# Patient Record
Sex: Female | Born: 1940
Health system: Southern US, Community
[De-identification: ages and names within clinical notes are randomized; demographics above are authoritative.]

## PROBLEM LIST (undated history)

## (undated) DIAGNOSIS — K760 Fatty (change of) liver, not elsewhere classified: Secondary | ICD-10-CM

## (undated) DIAGNOSIS — D759 Disease of blood and blood-forming organs, unspecified: Secondary | ICD-10-CM

## (undated) DIAGNOSIS — C539 Malignant neoplasm of cervix uteri, unspecified: Secondary | ICD-10-CM

## (undated) DIAGNOSIS — F40240 Claustrophobia: Secondary | ICD-10-CM

## (undated) DIAGNOSIS — G2581 Restless legs syndrome: Secondary | ICD-10-CM

## (undated) DIAGNOSIS — M720 Palmar fascial fibromatosis [Dupuytren]: Secondary | ICD-10-CM

## (undated) DIAGNOSIS — G3184 Mild cognitive impairment, so stated: Secondary | ICD-10-CM

## (undated) DIAGNOSIS — I10 Essential (primary) hypertension: Secondary | ICD-10-CM

## (undated) DIAGNOSIS — D649 Anemia, unspecified: Secondary | ICD-10-CM

## (undated) DIAGNOSIS — I499 Cardiac arrhythmia, unspecified: Secondary | ICD-10-CM

## (undated) DIAGNOSIS — Z87442 Personal history of urinary calculi: Secondary | ICD-10-CM

## (undated) DIAGNOSIS — Z862 Personal history of diseases of the blood and blood-forming organs and certain disorders involving the immune mechanism: Secondary | ICD-10-CM

## (undated) DIAGNOSIS — I491 Atrial premature depolarization: Secondary | ICD-10-CM

## (undated) HISTORY — DX: Malignant neoplasm of cervix uteri, unspecified: C53.9

## (undated) HISTORY — PX: CERVICAL LAMINECTOMY: SHX94

## (undated) HISTORY — PX: OTHER SURGICAL HISTORY: SHX169

## (undated) HISTORY — DX: Essential (primary) hypertension: I10

## (undated) HISTORY — DX: Atrial premature depolarization: I49.1

## (undated) HISTORY — PX: HARVEST BONE GRAFT: SHX377

## (undated) HISTORY — PX: APPENDECTOMY: SHX54

## (undated) HISTORY — DX: Personal history of diseases of the blood and blood-forming organs and certain disorders involving the immune mechanism: Z86.2

---

## 1998-06-15 ENCOUNTER — Ambulatory Visit (HOSPITAL_COMMUNITY): Admission: RE | Admit: 1998-06-15 | Discharge: 1998-06-15 | Payer: Self-pay

## 1998-12-17 ENCOUNTER — Ambulatory Visit (HOSPITAL_COMMUNITY): Admission: RE | Admit: 1998-12-17 | Discharge: 1998-12-17 | Payer: Self-pay | Admitting: Interventional Cardiology

## 1998-12-17 ENCOUNTER — Encounter: Payer: Self-pay | Admitting: Interventional Cardiology

## 2000-09-18 ENCOUNTER — Ambulatory Visit (HOSPITAL_COMMUNITY): Admission: RE | Admit: 2000-09-18 | Discharge: 2000-09-18 | Payer: Self-pay | Admitting: Emergency Medicine

## 2000-09-18 ENCOUNTER — Encounter: Payer: Self-pay | Admitting: Emergency Medicine

## 2002-05-05 ENCOUNTER — Ambulatory Visit (HOSPITAL_COMMUNITY): Admission: RE | Admit: 2002-05-05 | Discharge: 2002-05-05 | Payer: Self-pay | Admitting: Emergency Medicine

## 2002-05-05 ENCOUNTER — Encounter: Payer: Self-pay | Admitting: Emergency Medicine

## 2003-04-20 ENCOUNTER — Ambulatory Visit (HOSPITAL_COMMUNITY): Admission: RE | Admit: 2003-04-20 | Discharge: 2003-04-20 | Payer: Self-pay | Admitting: Emergency Medicine

## 2003-04-20 ENCOUNTER — Encounter: Payer: Self-pay | Admitting: Emergency Medicine

## 2004-09-06 ENCOUNTER — Ambulatory Visit (HOSPITAL_COMMUNITY): Admission: RE | Admit: 2004-09-06 | Discharge: 2004-09-06 | Payer: Self-pay | Admitting: Emergency Medicine

## 2005-03-09 ENCOUNTER — Emergency Department (HOSPITAL_COMMUNITY): Admission: EM | Admit: 2005-03-09 | Discharge: 2005-03-09 | Payer: Self-pay | Admitting: Emergency Medicine

## 2005-11-21 ENCOUNTER — Ambulatory Visit (HOSPITAL_COMMUNITY): Admission: RE | Admit: 2005-11-21 | Discharge: 2005-11-21 | Payer: Self-pay | Admitting: Internal Medicine

## 2005-12-27 ENCOUNTER — Encounter: Admission: RE | Admit: 2005-12-27 | Discharge: 2005-12-27 | Payer: Self-pay | Admitting: Internal Medicine

## 2006-11-23 ENCOUNTER — Ambulatory Visit (HOSPITAL_COMMUNITY): Admission: RE | Admit: 2006-11-23 | Discharge: 2006-11-23 | Payer: Self-pay | Admitting: Internal Medicine

## 2006-11-29 ENCOUNTER — Encounter: Admission: RE | Admit: 2006-11-29 | Discharge: 2006-11-29 | Payer: Self-pay | Admitting: Internal Medicine

## 2007-11-26 ENCOUNTER — Ambulatory Visit (HOSPITAL_COMMUNITY): Admission: RE | Admit: 2007-11-26 | Discharge: 2007-11-26 | Payer: Self-pay | Admitting: Internal Medicine

## 2008-11-26 ENCOUNTER — Ambulatory Visit (HOSPITAL_COMMUNITY): Admission: RE | Admit: 2008-11-26 | Discharge: 2008-11-26 | Payer: Self-pay | Admitting: Internal Medicine

## 2009-04-26 ENCOUNTER — Encounter: Admission: RE | Admit: 2009-04-26 | Discharge: 2009-04-26 | Payer: Self-pay | Admitting: Cardiology

## 2009-06-20 ENCOUNTER — Emergency Department (HOSPITAL_COMMUNITY): Admission: EM | Admit: 2009-06-20 | Discharge: 2009-06-20 | Payer: Self-pay | Admitting: Emergency Medicine

## 2009-11-29 ENCOUNTER — Ambulatory Visit (HOSPITAL_COMMUNITY): Admission: RE | Admit: 2009-11-29 | Discharge: 2009-11-29 | Payer: Self-pay | Admitting: Internal Medicine

## 2010-11-30 ENCOUNTER — Ambulatory Visit (HOSPITAL_COMMUNITY)
Admission: RE | Admit: 2010-11-30 | Discharge: 2010-11-30 | Payer: Self-pay | Source: Home / Self Care | Attending: Internal Medicine | Admitting: Internal Medicine

## 2010-12-06 ENCOUNTER — Encounter
Admission: RE | Admit: 2010-12-06 | Discharge: 2010-12-06 | Payer: Self-pay | Source: Home / Self Care | Attending: Internal Medicine | Admitting: Internal Medicine

## 2011-03-19 LAB — URINALYSIS, ROUTINE W REFLEX MICROSCOPIC
Bilirubin Urine: NEGATIVE
Hgb urine dipstick: NEGATIVE
Nitrite: NEGATIVE
Specific Gravity, Urine: 1.026 (ref 1.005–1.030)
pH: 6.5 (ref 5.0–8.0)

## 2011-03-19 LAB — COMPREHENSIVE METABOLIC PANEL
AST: 30 U/L (ref 0–37)
BUN: 15 mg/dL (ref 6–23)
Chloride: 104 mEq/L (ref 96–112)
Creatinine, Ser: 0.75 mg/dL (ref 0.4–1.2)
Glucose, Bld: 174 mg/dL — ABNORMAL HIGH (ref 70–99)
Potassium: 3.6 mEq/L (ref 3.5–5.1)
Sodium: 136 mEq/L (ref 135–145)
Total Protein: 7.1 g/dL (ref 6.0–8.3)

## 2011-03-19 LAB — POCT CARDIAC MARKERS: CKMB, poc: <1 ng/mL — ABNORMAL LOW (ref 1.0–8.0)

## 2011-03-19 LAB — CBC
RBC: 4.41 MIL/uL (ref 3.87–5.11)
WBC: 7.3 10*3/uL (ref 4.0–10.5)

## 2011-03-19 LAB — DIFFERENTIAL
Eosinophils Absolute: 0 10*3/uL (ref 0.0–0.7)
Eosinophils Relative: 0 % (ref 0–5)
Lymphocytes Relative: 9 % — ABNORMAL LOW (ref 12–46)
Lymphs Abs: 0.7 10*3/uL (ref 0.7–4.0)
Monocytes Absolute: 0.2 10*3/uL (ref 0.1–1.0)
Neutrophils Relative %: 88 % — ABNORMAL HIGH (ref 43–77)

## 2011-10-31 ENCOUNTER — Other Ambulatory Visit (HOSPITAL_COMMUNITY): Payer: Self-pay | Admitting: Internal Medicine

## 2011-10-31 DIAGNOSIS — Z1231 Encounter for screening mammogram for malignant neoplasm of breast: Secondary | ICD-10-CM

## 2011-12-08 ENCOUNTER — Ambulatory Visit (HOSPITAL_COMMUNITY)
Admission: RE | Admit: 2011-12-08 | Discharge: 2011-12-08 | Disposition: A | Discharge status: Home / Self Care | Payer: PRIVATE HEALTH INSURANCE | Source: Ambulatory Visit | Attending: Internal Medicine | Admitting: Internal Medicine

## 2011-12-08 DIAGNOSIS — Z1231 Encounter for screening mammogram for malignant neoplasm of breast: Secondary | ICD-10-CM | POA: Insufficient documentation

## 2012-11-06 ENCOUNTER — Other Ambulatory Visit (HOSPITAL_COMMUNITY): Payer: Self-pay | Admitting: Internal Medicine

## 2012-11-06 DIAGNOSIS — Z1231 Encounter for screening mammogram for malignant neoplasm of breast: Secondary | ICD-10-CM

## 2012-12-09 ENCOUNTER — Ambulatory Visit (HOSPITAL_COMMUNITY)
Admission: RE | Admit: 2012-12-09 | Discharge: 2012-12-09 | Disposition: A | Discharge status: Home / Self Care | Payer: PRIVATE HEALTH INSURANCE | Source: Ambulatory Visit | Attending: Internal Medicine | Admitting: Internal Medicine

## 2012-12-09 DIAGNOSIS — Z1231 Encounter for screening mammogram for malignant neoplasm of breast: Secondary | ICD-10-CM | POA: Insufficient documentation

## 2013-12-02 ENCOUNTER — Other Ambulatory Visit (HOSPITAL_COMMUNITY): Payer: Self-pay | Admitting: Internal Medicine

## 2013-12-02 DIAGNOSIS — Z1231 Encounter for screening mammogram for malignant neoplasm of breast: Secondary | ICD-10-CM

## 2013-12-17 ENCOUNTER — Ambulatory Visit (HOSPITAL_COMMUNITY)
Admission: RE | Admit: 2013-12-17 | Discharge: 2013-12-17 | Disposition: A | Discharge status: Home / Self Care | Payer: PRIVATE HEALTH INSURANCE | Source: Ambulatory Visit | Attending: Internal Medicine | Admitting: Internal Medicine

## 2013-12-17 DIAGNOSIS — Z1231 Encounter for screening mammogram for malignant neoplasm of breast: Secondary | ICD-10-CM | POA: Insufficient documentation

## 2014-01-19 ENCOUNTER — Other Ambulatory Visit: Payer: Self-pay | Admitting: Internal Medicine

## 2014-01-19 DIAGNOSIS — M542 Cervicalgia: Secondary | ICD-10-CM

## 2014-01-20 ENCOUNTER — Ambulatory Visit
Admission: RE | Admit: 2014-01-20 | Discharge: 2014-01-20 | Disposition: A | Discharge status: Home / Self Care | Payer: Medicare Other | Source: Ambulatory Visit | Attending: Internal Medicine | Admitting: Internal Medicine

## 2014-01-20 DIAGNOSIS — M542 Cervicalgia: Secondary | ICD-10-CM

## 2014-01-20 MED ORDER — GADOBENATE DIMEGLUMINE 529 MG/ML IV SOLN
12.0000 mL | Freq: Once | INTRAVENOUS | Status: AC | PRN
  Administered 2014-01-20: 12 mL via INTRAVENOUS

## 2016-01-27 DIAGNOSIS — I10 Essential (primary) hypertension: Secondary | ICD-10-CM | POA: Diagnosis not present

## 2016-02-08 DIAGNOSIS — I10 Essential (primary) hypertension: Secondary | ICD-10-CM | POA: Diagnosis not present

## 2016-02-08 DIAGNOSIS — F5104 Psychophysiologic insomnia: Secondary | ICD-10-CM | POA: Diagnosis not present

## 2016-02-08 DIAGNOSIS — F329 Major depressive disorder, single episode, unspecified: Secondary | ICD-10-CM | POA: Diagnosis not present

## 2016-02-08 DIAGNOSIS — Z6822 Body mass index (BMI) 22.0-22.9, adult: Secondary | ICD-10-CM | POA: Diagnosis not present

## 2016-02-08 DIAGNOSIS — Z1389 Encounter for screening for other disorder: Secondary | ICD-10-CM | POA: Diagnosis not present

## 2016-02-08 DIAGNOSIS — R0789 Other chest pain: Secondary | ICD-10-CM | POA: Diagnosis not present

## 2016-02-08 DIAGNOSIS — G2581 Restless legs syndrome: Secondary | ICD-10-CM | POA: Diagnosis not present

## 2016-02-08 DIAGNOSIS — M25552 Pain in left hip: Secondary | ICD-10-CM | POA: Diagnosis not present

## 2016-02-08 DIAGNOSIS — Z0000 Encounter for general adult medical examination without abnormal findings: Secondary | ICD-10-CM | POA: Diagnosis not present

## 2016-02-17 DIAGNOSIS — Z1212 Encounter for screening for malignant neoplasm of rectum: Secondary | ICD-10-CM | POA: Diagnosis not present

## 2016-04-08 ENCOUNTER — Observation Stay (HOSPITAL_COMMUNITY)
Admission: EM | Admit: 2016-04-08 | Discharge: 2016-04-10 | Disposition: A | Discharge status: Home / Self Care | Payer: PPO | Attending: Internal Medicine | Admitting: Internal Medicine

## 2016-04-08 ENCOUNTER — Encounter (HOSPITAL_COMMUNITY): Payer: Self-pay | Admitting: Emergency Medicine

## 2016-04-08 DIAGNOSIS — M549 Dorsalgia, unspecified: Secondary | ICD-10-CM | POA: Diagnosis not present

## 2016-04-08 DIAGNOSIS — M542 Cervicalgia: Secondary | ICD-10-CM | POA: Diagnosis not present

## 2016-04-08 DIAGNOSIS — S12601A Unspecified nondisplaced fracture of seventh cervical vertebra, initial encounter for closed fracture: Secondary | ICD-10-CM | POA: Insufficient documentation

## 2016-04-08 DIAGNOSIS — T148 Other injury of unspecified body region: Secondary | ICD-10-CM | POA: Diagnosis not present

## 2016-04-08 DIAGNOSIS — S0990XA Unspecified injury of head, initial encounter: Secondary | ICD-10-CM

## 2016-04-08 DIAGNOSIS — S12400A Unspecified displaced fracture of fifth cervical vertebra, initial encounter for closed fracture: Secondary | ICD-10-CM | POA: Diagnosis not present

## 2016-04-08 DIAGNOSIS — Z8249 Family history of ischemic heart disease and other diseases of the circulatory system: Secondary | ICD-10-CM | POA: Diagnosis not present

## 2016-04-08 DIAGNOSIS — S129XXA Fracture of neck, unspecified, initial encounter: Secondary | ICD-10-CM | POA: Diagnosis present

## 2016-04-08 DIAGNOSIS — I4891 Unspecified atrial fibrillation: Secondary | ICD-10-CM | POA: Insufficient documentation

## 2016-04-08 DIAGNOSIS — R55 Syncope and collapse: Secondary | ICD-10-CM | POA: Diagnosis not present

## 2016-04-08 DIAGNOSIS — R51 Headache: Secondary | ICD-10-CM | POA: Diagnosis not present

## 2016-04-08 DIAGNOSIS — S12501A Unspecified nondisplaced fracture of sixth cervical vertebra, initial encounter for closed fracture: Secondary | ICD-10-CM | POA: Insufficient documentation

## 2016-04-08 DIAGNOSIS — E876 Hypokalemia: Secondary | ICD-10-CM | POA: Diagnosis not present

## 2016-04-08 DIAGNOSIS — G2581 Restless legs syndrome: Secondary | ICD-10-CM | POA: Insufficient documentation

## 2016-04-08 DIAGNOSIS — W19XXXA Unspecified fall, initial encounter: Secondary | ICD-10-CM | POA: Insufficient documentation

## 2016-04-08 HISTORY — DX: Restless legs syndrome: G25.81

## 2016-04-08 LAB — CBC
HEMATOCRIT: 34.1 % — AB (ref 36.0–46.0)
Hemoglobin: 11.5 g/dL — ABNORMAL LOW (ref 12.0–15.0)
MCH: 30.7 pg (ref 26.0–34.0)
MCHC: 33.7 g/dL (ref 30.0–36.0)
MCV: 90.9 fL (ref 78.0–100.0)
Platelets: 238 10*3/uL (ref 150–400)
RBC: 3.75 MIL/uL — ABNORMAL LOW (ref 3.87–5.11)
RDW: 13 % (ref 11.5–15.5)
WBC: 7.8 10*3/uL (ref 4.0–10.5)

## 2016-04-08 LAB — COMPREHENSIVE METABOLIC PANEL
ALT: 18 U/L (ref 14–54)
ANION GAP: 11 (ref 5–15)
AST: 31 U/L (ref 15–41)
Albumin: 3.7 g/dL (ref 3.5–5.0)
Alkaline Phosphatase: 65 U/L (ref 38–126)
BUN: 21 mg/dL — AB (ref 6–20)
CHLORIDE: 106 mmol/L (ref 101–111)
CO2: 24 mmol/L (ref 22–32)
Calcium: 9.2 mg/dL (ref 8.9–10.3)
Creatinine, Ser: 0.84 mg/dL (ref 0.44–1.00)
Glucose, Bld: 142 mg/dL — ABNORMAL HIGH (ref 65–99)
POTASSIUM: 3.3 mmol/L — AB (ref 3.5–5.1)
Sodium: 141 mmol/L (ref 135–145)
Total Bilirubin: 0.6 mg/dL (ref 0.3–1.2)
Total Protein: 6.5 g/dL (ref 6.5–8.1)

## 2016-04-08 LAB — DIFFERENTIAL
BASOS ABS: 0 10*3/uL (ref 0.0–0.1)
BASOS PCT: 1 %
EOS ABS: 0.1 10*3/uL (ref 0.0–0.7)
EOS PCT: 2 %
Lymphocytes Relative: 17 %
Lymphs Abs: 1.3 10*3/uL (ref 0.7–4.0)
MONOS PCT: 7 %
Monocytes Absolute: 0.5 10*3/uL (ref 0.1–1.0)
Neutro Abs: 5.8 10*3/uL (ref 1.7–7.7)
Neutrophils Relative %: 73 %

## 2016-04-08 LAB — I-STAT CHEM 8, ED
BUN: 26 mg/dL — ABNORMAL HIGH (ref 6–20)
CREATININE: 0.8 mg/dL (ref 0.44–1.00)
Calcium, Ion: 1.15 mmol/L (ref 1.13–1.30)
Chloride: 103 mmol/L (ref 101–111)
Glucose, Bld: 141 mg/dL — ABNORMAL HIGH (ref 65–99)
HEMATOCRIT: 36 % (ref 36.0–46.0)
HEMOGLOBIN: 12.2 g/dL (ref 12.0–15.0)
Potassium: 3.4 mmol/L — ABNORMAL LOW (ref 3.5–5.1)
SODIUM: 142 mmol/L (ref 135–145)
TCO2: 25 mmol/L (ref 0–100)

## 2016-04-08 LAB — PROTIME-INR
INR: 1.08 (ref 0.00–1.49)
PROTHROMBIN TIME: 14.2 s (ref 11.6–15.2)

## 2016-04-08 LAB — ETHANOL: Alcohol, Ethyl (B): <5 mg/dL (ref <5)

## 2016-04-08 LAB — I-STAT TROPONIN, ED: TROPONIN I, POC: 0 ng/mL (ref 0.00–0.08)

## 2016-04-08 LAB — MAGNESIUM: MAGNESIUM: 1.8 mg/dL (ref 1.7–2.4)

## 2016-04-08 MED ORDER — SODIUM CHLORIDE 0.9 % IV BOLUS (SEPSIS)
1000.0000 mL | Freq: Once | INTRAVENOUS | Status: AC
  Administered 2016-04-08: 1000 mL via INTRAVENOUS

## 2016-04-08 NOTE — ED Provider Notes (Signed)
CSN: [id number]     Arrival date & time 04/08/16  2225 History   First MD Initiated Contact with Patient 04/08/16 2228     Chief Complaint  Patient presents with  . Fall     (Consider location/radiation/quality/duration/timing/severity/associated sxs/prior Treatment) HPI Comments: 75 year old female with atrial fibrillation history unknown smoking history presents with neck and upper back pain after fall. Patient had unwitnessed fall likely syncope at the home around 9:30 husband found her less responsive. Patient has improved since however has mild memory issues since. No details of event known patient doesn't recall details. Patient has had decreased intake recently and per family has been focused on losing weight. No stroke or cardiac history. No focal lateral deficits.  Patient is a 75 y.o. female presenting with fall. The history is provided by the patient.  Fall Pertinent negatives include no chest pain, no abdominal pain, no headaches and no shortness of breath.    Past Medical History  Diagnosis Date  . Restless leg   . A-fib [facility])    Past Surgical History  Procedure Laterality Date  . Harvest bone graft     No family history on file. Social History  Substance Use Topics  . Smoking status: Unknown If Ever Smoked  . Smokeless tobacco: None  . Alcohol Use: No   OB History    No data available     Review of Systems  Constitutional: Positive for fatigue. Negative for fever and chills.  HENT: Negative for congestion.   Eyes: Negative for visual disturbance.  Respiratory: Negative for shortness of breath.   Cardiovascular: Negative for chest pain.  Gastrointestinal: Negative for vomiting and abdominal pain.  Genitourinary: Negative for dysuria and flank pain.  Musculoskeletal: Positive for arthralgias and neck pain. Negative for back pain and neck stiffness.  Skin: Negative for rash.  Neurological: Positive for syncope. Negative for light-headedness and headaches.       Allergies  Review of patient's allergies indicates no known allergies.  Home Medications   Prior to Admission medications   Not on File   BP 138/65 mmHg  Pulse 82  Temp(Src) 97.7 F (36.5 C) (Oral)  Resp 22  SpO2 100% Physical Exam  Constitutional: She is oriented to person, place, and time. She appears well-developed and well-nourished.  HENT:  Head: Normocephalic.  Dry mucous membranes  Eyes: Conjunctivae are normal. Right eye exhibits no discharge. Left eye exhibits no discharge.  Neck: Normal range of motion. Neck supple. No tracheal deviation present.  Cardiovascular: Normal rate and regular rhythm.   Pulmonary/Chest: Effort normal and breath sounds normal.  Abdominal: Soft. She exhibits no distension. There is no tenderness. There is no guarding.  Musculoskeletal: She exhibits tenderness. She exhibits no edema.  No midline thoracic or lumbar tenderness mild midline cervical tenderness c-collar in place. No focal tenderness to hips knees or ankles bilateral, no shoulder or elbow tenderness bilateral. Patient has 5+ strength flexion and extension of the shoulders and wrists bilateral. Patient has 5+ strength with hip flexion, great toe flexion and extension bilateral  Neurological: She is alert and oriented to person, place, and time.  Reflex Scores:      Patellar reflexes are 2+ on the right side and 2+ on the left side.      Achilles reflexes are 2+ on the right side and 2+ on the left side. Patient general fatigue appearance mild slowness to respond to questions. Patient alert to city name however did not recall year. Mild repetitive questioning. Extraocular  muscle function intact no facial droop, no arm drift or leg drift. Gen. weakness on exam pupils equal bilateral  Skin: Skin is warm. No rash noted.  Psychiatric: She is slowed.  Repetitive questioning  Nursing note and vitals reviewed.   ED Course  Procedures (including critical care time) Labs  Review Labs Reviewed  CBC - Abnormal; Notable for the following:    RBC 3.75 (*)    Hemoglobin 11.5 (*)    HCT 34.1 (*)    All other components within normal limits  COMPREHENSIVE METABOLIC PANEL - Abnormal; Notable for the following:    Potassium 3.3 (*)    Glucose, Bld 142 (*)    BUN 21 (*)    All other components within normal limits  URINALYSIS, ROUTINE W REFLEX MICROSCOPIC (NOT AT [facility]) - Abnormal; Notable for the following:    Glucose, UA 100 (*)    All other components within normal limits  I-STAT CHEM 8, ED - Abnormal; Notable for the following:    Potassium 3.4 (*)    BUN 26 (*)    Glucose, Bld 141 (*)    All other components within normal limits  ETHANOL  PROTIME-INR  DIFFERENTIAL  MAGNESIUM  URINE RAPID DRUG SCREEN, HOSP PERFORMED  I-STAT TROPOININ, ED    Imaging Review Dg Chest 2 View  04/09/2016  CLINICAL DATA:  Unwitnessed fall tonight. Confusion. Upper back and LEFT shoulder pain. EXAM: CHEST  2 VIEW COMPARISON:  Chest radiograph Apr 26, 2009 FINDINGS: Cardiomediastinal silhouette is normal. Mildly calcified aortic knob. The lungs are clear without pleural effusions or focal consolidations. Trachea projects midline and there is no pneumothorax. Soft tissue planes and included osseous structures are non-suspicious. Catheter projects in retroperitoneum, possibly external to the patient seen only on the lateral radiograph. IMPRESSION: No active cardiopulmonary disease. Electronically Signed   By: Elon Alas M.D.   On: 04/09/2016 00:41   Ct Head Wo Contrast  04/09/2016  CLINICAL DATA:  [place], patient does not recall event. Headache and vomiting, neck pain. EXAM: CT HEAD WITHOUT CONTRAST CT CERVICAL SPINE WITHOUT CONTRAST TECHNIQUE: Multidetector CT imaging of the head and cervical spine was performed following the standard protocol without intravenous contrast. Multiplanar CT image reconstructions of the cervical spine were also generated. COMPARISON:  MRI of the  cervical spine January 20, 2014 and CT HEAD June 20, 2009. FINDINGS: CT HEAD FINDINGS INTRACRANIAL CONTENTS: The ventricles and sulci are normal for age. No intraparenchymal hemorrhage, mass effect nor midline shift. Patchy supratentorial white matter hypodensities are less than expected for patient's age and though non-specific likely represent chronic small vessel ischemic disease. No acute large vascular territory infarcts. No abnormal extra-axial fluid collections. Basal cisterns are patent. Moderate calcific atherosclerosis of the carotid siphons. ORBITS: The included ocular globes and orbital contents are non-suspicious. SINUSES: Small RIGHT ethmoid mucosal retention cyst. The mastoid aircells and included paranasal sinuses are otherwise well-aerated. SKULL/SOFT TISSUES: No skull fracture. No significant soft tissue swelling. CT CERVICAL SPINE FINDINGS Osseous structures: Acute nondisplaced fracture through C5 bilateral pedicles extending into the facets. New nondisplaced fracture extension through C6-7 vertebra anterior and middle columns, status post C5 through C7 fusion with arthrodesis. The remaining vertebral bodies are intact. Straightened cervical lordosis. No malalignment. Severe at C3-4 and C4-5 disc height loss with uncovertebral hypertrophy compatible with degenerative disc. Multilevel moderate facet arthropathy. No destructive bony lesions. C1-2 articulation maintained with mild arthropathy. No destructive bony lesions. No significant prevertebral or paraspinal soft tissue swelling. Bilateral lung apical fibronodular  scarring. IMPRESSION: CT HEAD: Negative CT HEAD for age. CT CERVICAL SPINE: Acute nondisplaced 3 column fracture through C5 through C7 solid fusion mass without malalignment. As fractures involve the pedicles, vertebral artery injury is possible and could be evaluated by CTA NECK. Acute findings discussed with and reconfirmed by Dr.Evgenia Merriman on 04/09/2016 at 1:16 am. Electronically  Signed   By: Elon Alas M.D.   On: 04/09/2016 01:16   Ct Cervical Spine Wo Contrast  04/09/2016  CLINICAL DATA:  [place], patient does not recall event. Headache and vomiting, neck pain. EXAM: CT HEAD WITHOUT CONTRAST CT CERVICAL SPINE WITHOUT CONTRAST TECHNIQUE: Multidetector CT imaging of the head and cervical spine was performed following the standard protocol without intravenous contrast. Multiplanar CT image reconstructions of the cervical spine were also generated. COMPARISON:  MRI of the cervical spine January 20, 2014 and CT HEAD June 20, 2009. FINDINGS: CT HEAD FINDINGS INTRACRANIAL CONTENTS: The ventricles and sulci are normal for age. No intraparenchymal hemorrhage, mass effect nor midline shift. Patchy supratentorial white matter hypodensities are less than expected for patient's age and though non-specific likely represent chronic small vessel ischemic disease. No acute large vascular territory infarcts. No abnormal extra-axial fluid collections. Basal cisterns are patent. Moderate calcific atherosclerosis of the carotid siphons. ORBITS: The included ocular globes and orbital contents are non-suspicious. SINUSES: Small RIGHT ethmoid mucosal retention cyst. The mastoid aircells and included paranasal sinuses are otherwise well-aerated. SKULL/SOFT TISSUES: No skull fracture. No significant soft tissue swelling. CT CERVICAL SPINE FINDINGS Osseous structures: Acute nondisplaced fracture through C5 bilateral pedicles extending into the facets. New nondisplaced fracture extension through C6-7 vertebra anterior and middle columns, status post C5 through C7 fusion with arthrodesis. The remaining vertebral bodies are intact. Straightened cervical lordosis. No malalignment. Severe at C3-4 and C4-5 disc height loss with uncovertebral hypertrophy compatible with degenerative disc. Multilevel moderate facet arthropathy. No destructive bony lesions. C1-2 articulation maintained with mild arthropathy. No  destructive bony lesions. No significant prevertebral or paraspinal soft tissue swelling. Bilateral lung apical fibronodular scarring. IMPRESSION: CT HEAD: Negative CT HEAD for age. CT CERVICAL SPINE: Acute nondisplaced 3 column fracture through C5 through C7 solid fusion mass without malalignment. As fractures involve the pedicles, vertebral artery injury is possible and could be evaluated by CTA NECK. Acute findings discussed with and reconfirmed by Dr.Reyce Lubeck on 04/09/2016 at 1:16 am. Electronically Signed   By: Elon Alas M.D.   On: 04/09/2016 01:16   I have personally reviewed and evaluated these images and lab results as part of my medical decision-making.   EKG Interpretation   Date/Time:  Saturday April 08 2016 23:10:21 EDT Ventricular Rate:  77 PR Interval:  184 QRS Duration: 89 QT Interval:  391 QTC Calculation: 442 R Axis:   58 Text Interpretation:  Sinus rhythm RSR' in V1 or V2, probably normal  variant Confirmed by Keaundre Thelin  MD, Onya Eutsler ([id number]) on 04/08/2016 11:17:26 PM      MDM   Final diagnoses:  Syncope and collapse  Acute head injury, initial encounter  Cervical spine fracture, initial encounter   Patient presents after unwitnessed fall/syncope. No focal neuro deficits except for mild memory. Clinical concern for syncope with concussion. Plan for general workup, CT had neck, x-ray and likely plan for telemetry observation. Discussed with neurology who agrees with plan.  Radiologist discussed CT results concerning for C5 C6 C7 oblique fractures. Reassessment patient has pain in the neck and nausea however denies any numbness or weakness in her arms or legs.  Discussed the case with Dr. Cyndy Freeze will look at the images and assessed the patient. Paged medicine for admission.  The patients results and plan were reviewed and discussed.   Any x-rays performed were independently reviewed by myself.   Differential diagnosis were considered with the presenting  HPI.  Medications  sodium chloride 0.9 % bolus 1,000 mL (1,000 mLs Intravenous New Bag/Given 04/08/16 2320)  ondansetron (ZOFRAN) injection 4 mg (4 mg Intravenous Given 04/09/16 0051)    Filed Vitals:   04/08/16 2315 04/08/16 2330 04/08/16 2345 04/09/16 0000  BP: 127/61 131/59 129/59 138/65  Pulse: 75 74 78 82  Temp:      TempSrc:      Resp: 18 14 23 22   SpO2: 100% 100% 100% 100%    Final diagnoses:  Syncope and collapse  Acute head injury, initial encounter  Cervical spine fracture, initial encounter    Admission/ observation were discussed with the admitting physician, patient and/or family and they are comfortable with the plan.    Elnora Morrison, MD 04/09/16 [phone]

## 2016-04-08 NOTE — ED Notes (Addendum)
Pt arrives via EMS for unwitnessed fall, EMS reports intially unresponsive, now conscious but disoriented and repeatative questioning, asking for husband. CCollar in place. C/o upper back pain and bilateral shoulder pain.

## 2016-04-09 ENCOUNTER — Emergency Department (HOSPITAL_COMMUNITY): Payer: PPO

## 2016-04-09 ENCOUNTER — Inpatient Hospital Stay (HOSPITAL_COMMUNITY): Payer: PPO

## 2016-04-09 ENCOUNTER — Encounter (HOSPITAL_COMMUNITY): Payer: Self-pay | Admitting: Cardiology

## 2016-04-09 DIAGNOSIS — R51 Headache: Secondary | ICD-10-CM | POA: Diagnosis not present

## 2016-04-09 DIAGNOSIS — M549 Dorsalgia, unspecified: Secondary | ICD-10-CM | POA: Diagnosis not present

## 2016-04-09 DIAGNOSIS — R55 Syncope and collapse: Secondary | ICD-10-CM | POA: Diagnosis present

## 2016-04-09 DIAGNOSIS — S0990XA Unspecified injury of head, initial encounter: Secondary | ICD-10-CM | POA: Diagnosis present

## 2016-04-09 DIAGNOSIS — S129XXA Fracture of neck, unspecified, initial encounter: Secondary | ICD-10-CM

## 2016-04-09 DIAGNOSIS — I480 Paroxysmal atrial fibrillation: Secondary | ICD-10-CM

## 2016-04-09 DIAGNOSIS — S199XXA Unspecified injury of neck, initial encounter: Secondary | ICD-10-CM | POA: Diagnosis not present

## 2016-04-09 DIAGNOSIS — S12491A Other nondisplaced fracture of fifth cervical vertebra, initial encounter for closed fracture: Secondary | ICD-10-CM | POA: Diagnosis not present

## 2016-04-09 DIAGNOSIS — I4891 Unspecified atrial fibrillation: Secondary | ICD-10-CM | POA: Diagnosis not present

## 2016-04-09 DIAGNOSIS — S12591A Other nondisplaced fracture of sixth cervical vertebra, initial encounter for closed fracture: Secondary | ICD-10-CM | POA: Diagnosis not present

## 2016-04-09 DIAGNOSIS — S12400A Unspecified displaced fracture of fifth cervical vertebra, initial encounter for closed fracture: Secondary | ICD-10-CM | POA: Diagnosis not present

## 2016-04-09 DIAGNOSIS — S12691A Other nondisplaced fracture of seventh cervical vertebra, initial encounter for closed fracture: Secondary | ICD-10-CM | POA: Diagnosis not present

## 2016-04-09 DIAGNOSIS — M542 Cervicalgia: Secondary | ICD-10-CM | POA: Diagnosis not present

## 2016-04-09 HISTORY — DX: Syncope and collapse: R55

## 2016-04-09 HISTORY — DX: Unspecified injury of head, initial encounter: S09.90XA

## 2016-04-09 HISTORY — DX: Fracture of neck, unspecified, initial encounter: S12.9XXA

## 2016-04-09 HISTORY — DX: Unspecified atrial fibrillation: I48.91

## 2016-04-09 LAB — URINALYSIS, ROUTINE W REFLEX MICROSCOPIC
Bilirubin Urine: NEGATIVE
GLUCOSE, UA: 100 mg/dL — AB
Hgb urine dipstick: NEGATIVE
KETONES UR: NEGATIVE mg/dL
LEUKOCYTES UA: NEGATIVE
NITRITE: NEGATIVE
PROTEIN: NEGATIVE mg/dL
Specific Gravity, Urine: 1.026 (ref 1.005–1.030)
pH: 5 (ref 5.0–8.0)

## 2016-04-09 LAB — BASIC METABOLIC PANEL
Anion gap: 7 (ref 5–15)
BUN: 16 mg/dL (ref 6–20)
CALCIUM: 9 mg/dL (ref 8.9–10.3)
CHLORIDE: 109 mmol/L (ref 101–111)
CO2: 24 mmol/L (ref 22–32)
CREATININE: 0.71 mg/dL (ref 0.44–1.00)
Glucose, Bld: 124 mg/dL — ABNORMAL HIGH (ref 65–99)
Potassium: 3.8 mmol/L (ref 3.5–5.1)
SODIUM: 140 mmol/L (ref 135–145)

## 2016-04-09 LAB — CBC
HCT: 34.5 % — ABNORMAL LOW (ref 36.0–46.0)
Hemoglobin: 11.4 g/dL — ABNORMAL LOW (ref 12.0–15.0)
MCH: 29.9 pg (ref 26.0–34.0)
MCHC: 33 g/dL (ref 30.0–36.0)
MCV: 90.6 fL (ref 78.0–100.0)
PLATELETS: 239 10*3/uL (ref 150–400)
RBC: 3.81 MIL/uL — ABNORMAL LOW (ref 3.87–5.11)
RDW: 13 % (ref 11.5–15.5)
WBC: 7.2 10*3/uL (ref 4.0–10.5)

## 2016-04-09 LAB — RAPID URINE DRUG SCREEN, HOSP PERFORMED
Amphetamines: NOT DETECTED
BARBITURATES: NOT DETECTED
Benzodiazepines: NOT DETECTED
COCAINE: NOT DETECTED
Opiates: NOT DETECTED
Tetrahydrocannabinol: NOT DETECTED

## 2016-04-09 MED ORDER — HYDROCODONE-ACETAMINOPHEN 5-325 MG PO TABS
1.0000 | ORAL_TABLET | ORAL | Status: DC | PRN
  Administered 2016-04-10: 1 via ORAL
  Filled 2016-04-09: qty 1

## 2016-04-09 MED ORDER — ACETAMINOPHEN 650 MG RE SUPP: 650.0000 mg | Freq: Four times a day (QID) | RECTAL | Status: DC | PRN

## 2016-04-09 MED ORDER — ROPINIROLE HCL 1 MG PO TABS
2.0000 mg | ORAL_TABLET | Freq: Every day | ORAL | Status: DC
  Administered 2016-04-09: 2 mg via ORAL
  Filled 2016-04-09: qty 2

## 2016-04-09 MED ORDER — SODIUM CHLORIDE 0.9 % IV SOLN: INTRAVENOUS | Status: AC

## 2016-04-09 MED ORDER — ACETAMINOPHEN 325 MG PO TABS: 650.0000 mg | ORAL_TABLET | Freq: Four times a day (QID) | ORAL | Status: DC | PRN

## 2016-04-09 MED ORDER — IOPAMIDOL (ISOVUE-370) INJECTION 76%
INTRAVENOUS | Status: AC
  Administered 2016-04-09: 50 mL
  Filled 2016-04-09: qty 50

## 2016-04-09 MED ORDER — ROPINIROLE HCL 1 MG PO TABS: 1.0000 mg | ORAL_TABLET | ORAL | Status: DC

## 2016-04-09 MED ORDER — HYDROMORPHONE HCL 1 MG/ML IJ SOLN
0.5000 mg | INTRAMUSCULAR | Status: DC | PRN
  Administered 2016-04-09: 1 mg via INTRAVENOUS
  Filled 2016-04-09: qty 1

## 2016-04-09 MED ORDER — ROPINIROLE HCL 1 MG PO TABS
1.0000 mg | ORAL_TABLET | Freq: Every morning | ORAL | Status: DC
  Administered 2016-04-09 – 2016-04-10 (×2): 1 mg via ORAL
  Filled 2016-04-09 (×2): qty 1

## 2016-04-09 MED ORDER — ONDANSETRON HCL 4 MG PO TABS: 4.0000 mg | ORAL_TABLET | Freq: Four times a day (QID) | ORAL | Status: DC | PRN

## 2016-04-09 MED ORDER — HEPARIN SODIUM (PORCINE) 5000 UNIT/ML IJ SOLN
5000.0000 [IU] | Freq: Three times a day (TID) | INTRAMUSCULAR | Status: DC
  Administered 2016-04-09 – 2016-04-10 (×4): 5000 [IU] via SUBCUTANEOUS
  Filled 2016-04-09 (×4): qty 1

## 2016-04-09 MED ORDER — SODIUM CHLORIDE 0.9% FLUSH
3.0000 mL | Freq: Two times a day (BID) | INTRAVENOUS | Status: DC
  Administered 2016-04-09 – 2016-04-10 (×2): 3 mL via INTRAVENOUS

## 2016-04-09 MED ORDER — ONDANSETRON HCL 4 MG/2ML IJ SOLN
4.0000 mg | Freq: Once | INTRAMUSCULAR | Status: AC
  Administered 2016-04-09: 4 mg via INTRAVENOUS

## 2016-04-09 MED ORDER — ONDANSETRON HCL 4 MG/2ML IJ SOLN
4.0000 mg | Freq: Four times a day (QID) | INTRAMUSCULAR | Status: DC | PRN
  Administered 2016-04-09 (×2): 4 mg via INTRAVENOUS
  Filled 2016-04-09 (×2): qty 2

## 2016-04-09 MED ORDER — SODIUM CHLORIDE 0.9 % IV SOLN
INTRAVENOUS | Status: DC
  Administered 2016-04-09: 05:00:00 via INTRAVENOUS

## 2016-04-09 NOTE — Consult Note (Addendum)
Cardiology Consult Note  Admit date: 04/08/2016 Name: Elizabeth Odom 75 y.o.  female DOB:  1941/02/11 MRN:  [id number]  Today's date:  04/09/2016  Referring Physician:    [facility]  Primary Physician:    Dr. Janie Morning  Reason for Consultation:    Syncope  IMPRESSIONS: 1.  Syncopal episode resulting in neck fracture-differential would be orthostasis versus a bradycardia arrhythmia or tachycardia arrhythmia.  She does have a long-standing history of dizziness that sounds Iikeorthostasis 2.  Anxiety and situational stress 3.  Previous cervical disc disease  RECOMMENDATION:  1.  Would obtain orthostatic blood pressures while she is in the hospital 2.  She needs to have an implantable loop monitor unless a bradycardia arrhythmia is noted while she is on telemetry in the hospital.  This should be done prior to discharge. 3.  Obtain echocardiogram.  HISTORY: This very nice 75 year old female was admitted with a syncopal episode.  She has a history of recurrent dizziness and some recurrent palpitations.  I last saw her in 2010.  At that time she wore an event monitor that showed PACs and some occasional bradycardia in the 50s but no severe bradycardia or severe tachycardia was noted.  No atrial fibrillation was noted.  An echocardiogram and treadmill test were unremarkable at that time.  I have not seen her since then.  Her family reports that she has had recurrent episodes of dizziness more when she is up on her feet described as lightheadedness.  About a year ago she had a syncopal episode when she got up off of the commode but did not seek attention.  She continues to have intermittent dizzy spells and sometimes notes that her heart races during them.  She does not have chest pain or shortness of breath.  She was getting ready for bed and was standing up checking her cell phone according to the family when her husband heard a thump and went in.  She was found slumped over and regained  consciousness after 1 minute but does not have any recollection of the episode.  She was brought here and found to have a cervical fracture and is currently in a neck brace.  She does not have any chest pain or shortness of breath.  She normally is active and swims without difficulty.  She has been under situational stress with 2 family members having died recently.  The only new medication is ropinole given for restless legs.  She had a cardiac catheterization a number of years ago by Dr. Pernell Dupre that reportedly was unremarkable.   Past Medical History  Diagnosis Date  . Restless leg   . A-fib [facility])       Past Surgical History  Procedure Laterality Date  . Harvest bone graft       Allergies:  is allergic to sulfur.   Medications: Prior to Admission medications   Medication Sig Start Date End Date Taking? Authorizing Provider  rOPINIRole (REQUIP) 1 MG tablet Take 1-2 tablets by mouth See admin instructions. Pt takes 1 tablet in the morning, and 2 tablets at bedtime 03/28/16  Yes Historical Provider, MD   Family History: Family Status  Relation Status Death Age  . Father Deceased 25    MI  . Mother Deceased 45    History of CAD  . Sister Deceased     Died quickly after rare blood disorder  . Brother Deceased     Cancer  . Brother Alive    Social History:  reports that she has never smoked. She has never used smokeless tobacco. She reports that she does not drink alcohol.   Married, currently lives with husband has a number of concerned family members  Review of Systems: Significant situational stress and depression due to death recently Brother and sister.  No GI problems.  Other than as noted above the remainder of the review of systems is unremarkable.  Physical Exam: BP 115/52 mmHg  Pulse 67  Temp(Src) 97.4 F (36.3 C) (Axillary)  Resp 20  Ht 5\' 7"  (1.702 m)  Wt 62.324 kg (137 lb 6.4 oz)  BMI 21.51 kg/m2  SpO2 98%  General appearance: Pleasant female mildly  lethargic lying in bed in no acute distress oriented and able to give complete history Head: Normocephalic, without obvious abnormality, atraumatic Eyes: conjunctivae/corneas clear. PERRL, EOM's intact. Fundi not examined Neck: Neck is in a neck brace and could not be adequately examined Lungs: clear to auscultation bilaterally Heart: regular rate and rhythm, S1, S2 normal, no murmur, click, rub or gallop Abdomen: soft, non-tender; bowel sounds normal; no masses,  no organomegaly Pelvic: deferred Pulses: 2+ and symmetric Skin: Skin color, texture, turgor normal. No rashes or lesions Neurologic: Grossly normal  Labs: CBC  Recent Labs  04/08/16 2300  04/09/16 0605  WBC 7.8  --  7.2  RBC 3.75*  --  3.81*  HGB 11.5*  < > 11.4*  HCT 34.1*  < > 34.5*  PLT 238  --  239  MCV 90.9  --  90.6  MCH 30.7  --  29.9  MCHC 33.7  --  33.0  RDW 13.0  --  13.0  LYMPHSABS 1.3  --   --   MONOABS 0.5  --   --   EOSABS 0.1  --   --   BASOSABS 0.0  --   --   < > = values in this interval not displayed. CMP   Recent Labs  04/08/16 2300  04/09/16 0605  NA 141  < > 140  K 3.3*  < > 3.8  CL 106  < > 109  CO2 24  --  24  GLUCOSE 142*  < > 124*  BUN 21*  < > 16  CREATININE 0.84  < > 0.71  CALCIUM 9.2  --  9.0  PROT 6.5  --   --   ALBUMIN 3.7  --   --   AST 31  --   --   ALT 18  --   --   ALKPHOS 65  --   --   BILITOT 0.6  --   --   GFRNONAA >60  --  >60  GFRAA >60  --  >60  < > = values in this interval not displayed.   Radiology: No active cardiopulmonary disease  EKG: Sinus rhythm with incomplete right bundle branch block  Signed:  W. Doristine Church MD [facility]   Cardiology Consultant  04/09/2016, 12:13 PM

## 2016-04-09 NOTE — Progress Notes (Signed)
PT Cancellation Note  Patient Details Name: MYANI BUSAM MRN: [id number] DOB: [birth year]   Cancelled Treatment:    Reason Eval/Treat Not Completed: Other (comment) (Waiting for delivery of brace.  )  Will return tomorrow to complete evaluation.  Collie Siad PT, DPT  Pager: [phone] Phone: [phone] 04/09/2016, 3:25 PM

## 2016-04-09 NOTE — Progress Notes (Signed)
Films reviewed. Patient had prior fusion, and none of these fractures render the C5-C7 mass unstable. She should remain in the cervical collar. I will assess in the morning.

## 2016-04-09 NOTE — H&P (Signed)
[facility] Admission History and Physical       Elizabeth Odom [id number] DOB: 06/15/1941 DOA: 04/08/2016  Referring physician: EDP PCP: No primary care provider on file.  Specialists:   Chief Complaint: Passed out and [place]  HPI: Elizabeth Odom is a 75 y.o. female with a history of Atrial Fibrillation and Syncope who was brought to the ED by EMS after her husband heard her fall and found her against the wall.    She can not give the history because she does not remember what happened.  She had passed out and when she came around she had complaints of headache and neck pain.   In the ED, she was evalauted and found to have an acute non-displaced fractures of the C-Spine levels C5-C-7, of which she had a previous C-Spine fusion.    Dr. Christella Noa of Neurosurgery was consulted and is following the patient.   A Cervical Collar was placed and a CTA of the Neck was also ordered to evaluate to rule out a dissection of the vertebral artery.       Review of Systems:  Constitutional: No Weight Loss, No Weight Gain, Night Sweats, Fevers, Chills, Dizziness, Light Headedness, Fatigue, or Generalized Weakness HEENT: No Headaches, Difficulty Swallowing,Tooth/Dental Problems,Sore Throat,  No Sneezing, Rhinitis, Ear Ache, Nasal Congestion, or Post Nasal Drip,  Cardio-vascular:  No Chest pain, Orthopnea, PND, Edema in Lower Extremities, Anasarca, Dizziness, Palpitations  Resp: No Dyspnea, No DOE, No Productive Cough, No Non-Productive Cough, No Hemoptysis, No Wheezing.    GI: No Heartburn, Indigestion, Abdominal Pain, Nausea, Vomiting, Diarrhea, Constipation, Hematemesis, Hematochezia, Melena, Change in Bowel Habits,  Loss of Appetite  GU: No Dysuria, No Change in Color of Urine, No Urgency or Urinary Frequency, No Flank pain.  Musculoskeletal: No Joint Pain or Swelling, No Decreased Range of Motion, No Back Pain.  Neurologic: +Syncope, No Seizures, Muscle Weakness, Paresthesia, Vision Disturbance  or Loss, No Diplopia, No Vertigo, No Difficulty Walking,  Skin: No Rash or Lesions. Psych: No Change in Mood or Affect, No Depression or Anxiety, No Memory loss, No Confusion, or Hallucinations   Past Medical History  Diagnosis Date  . Restless leg   . A-fib [facility])      Past Surgical History  Procedure Laterality Date  . [place] bone graft         Cervical Spine Fusion   Prior to Admission medications   Not on File     No Known Allergies    Social History:  reports that she does not drink alcohol. Her tobacco and drug histories are not on file.     No family history on file.     Physical Exam:  GEN:  Pleasant Thin Elderly 75 y.o. Caucasian female examined and in no acute distress; cooperative with exam Filed Vitals:   04/09/16 0300 04/09/16 0301 04/09/16 0315 04/09/16 0330  BP: 117/105 117/105 120/54 118/54  Pulse: 91 93 84 84  Temp:      TempSrc:      Resp: 22 18 28 23   SpO2: 99% 99% 98% 97%   Blood pressure 118/54, pulse 84, temperature 97.7 F (36.5 C), temperature source Oral, resp. rate 23, SpO2 97 %. PSYCH: She is alert and oriented x4; does not appear anxious does not appear depressed; affect is normal HEENT: Normocephalic and Atraumatic, Mucous membranes pink; PERRLA; EOM intact; Fundi:  Benign;  No scleral icterus, Nares: Patent, Oropharynx: Clear, Fair Dentition,    Neck:  FROM, No Cervical Lymphadenopathy  nor Thyromegaly or Carotid Bruit; No JVD; Breasts:: Not examined CHEST WALL: No tenderness CHEST: Normal respiration, clear to auscultation bilaterally HEART: Regular rate and rhythm; no murmurs rubs or gallops BACK: No kyphosis or scoliosis; No CVA tenderness ABDOMEN: Positive Bowel Sounds, Soft Non-Tender, No Rebound or Guarding; No Masses, No Organomegaly Rectal Exam: Not done EXTREMITIES: No Cyanosis, Clubbing, or Edema; No Ulcerations. Genitalia: not examined PULSES: 2+ and symmetric SKIN: Normal hydration no rash or ulceration CNS:  Alert  and Oriented x 4, No Focal Deficits Vascular: pulses palpable throughout    Labs on Admission:  Basic Metabolic Panel:  Recent Labs Lab 04/08/16 2300 04/08/16 2316  NA 141 142  K 3.3* 3.4*  CL 106 103  CO2 24  --   GLUCOSE 142* 141*  BUN 21* 26*  CREATININE 0.84 0.80  CALCIUM 9.2  --   MG 1.8  --    Liver Function Tests:  Recent Labs Lab 04/08/16 2300  AST 31  ALT 18  ALKPHOS 65  BILITOT 0.6  PROT 6.5  ALBUMIN 3.7   No results for input(s): LIPASE, AMYLASE in the last 168 hours. No results for input(s): AMMONIA in the last 168 hours. CBC:  Recent Labs Lab 04/08/16 2300 04/08/16 2316  WBC 7.8  --   NEUTROABS 5.8  --   HGB 11.5* 12.2  HCT 34.1* 36.0  MCV 90.9  --   PLT 238  --    Cardiac Enzymes: No results for input(s): CKTOTAL, CKMB, CKMBINDEX, TROPONINI in the last 168 hours.  BNP (last 3 results) No results for input(s): BNP in the last 8760 hours.  ProBNP (last 3 results) No results for input(s): PROBNP in the last 8760 hours.  CBG: No results for input(s): GLUCAP in the last 168 hours.  Radiological Exams on Admission: Dg Chest 2 View  04/09/2016  CLINICAL DATA:  Unwitnessed fall tonight. Confusion. Upper back and LEFT shoulder pain. EXAM: CHEST  2 VIEW COMPARISON:  Chest radiograph Apr 26, 2009 FINDINGS: Cardiomediastinal silhouette is normal. Mildly calcified aortic knob. The lungs are clear without pleural effusions or focal consolidations. Trachea projects midline and there is no pneumothorax. Soft tissue planes and included osseous structures are non-suspicious. Catheter projects in retroperitoneum, possibly external to the patient seen only on the lateral radiograph. IMPRESSION: No active cardiopulmonary disease. Electronically Signed   By: Elon Alas M.D.   On: 04/09/2016 00:41   Ct Head Wo Contrast  04/09/2016  CLINICAL DATA:  [place], patient does not recall event. Headache and vomiting, neck pain. EXAM: CT HEAD WITHOUT CONTRAST CT  CERVICAL SPINE WITHOUT CONTRAST TECHNIQUE: Multidetector CT imaging of the head and cervical spine was performed following the standard protocol without intravenous contrast. Multiplanar CT image reconstructions of the cervical spine were also generated. COMPARISON:  MRI of the cervical spine January 20, 2014 and CT HEAD June 20, 2009. FINDINGS: CT HEAD FINDINGS INTRACRANIAL CONTENTS: The ventricles and sulci are normal for age. No intraparenchymal hemorrhage, mass effect nor midline shift. Patchy supratentorial white matter hypodensities are less than expected for patient's age and though non-specific likely represent chronic small vessel ischemic disease. No acute large vascular territory infarcts. No abnormal extra-axial fluid collections. Basal cisterns are patent. Moderate calcific atherosclerosis of the carotid siphons. ORBITS: The included ocular globes and orbital contents are non-suspicious. SINUSES: Small RIGHT ethmoid mucosal retention cyst. The mastoid aircells and included paranasal sinuses are otherwise well-aerated. SKULL/SOFT TISSUES: No skull fracture. No significant soft tissue swelling. CT CERVICAL SPINE FINDINGS  Osseous structures: Acute nondisplaced fracture through C5 bilateral pedicles extending into the facets. New nondisplaced fracture extension through C6-7 vertebra anterior and middle columns, status post C5 through C7 fusion with arthrodesis. The remaining vertebral bodies are intact. Straightened cervical lordosis. No malalignment. Severe at C3-4 and C4-5 disc height loss with uncovertebral hypertrophy compatible with degenerative disc. Multilevel moderate facet arthropathy. No destructive bony lesions. C1-2 articulation maintained with mild arthropathy. No destructive bony lesions. No significant prevertebral or paraspinal soft tissue swelling. Bilateral lung apical fibronodular scarring. IMPRESSION: CT HEAD: Negative CT HEAD for age. CT CERVICAL SPINE: Acute nondisplaced 3 column  fracture through C5 through C7 solid fusion mass without malalignment. As fractures involve the pedicles, vertebral artery injury is possible and could be evaluated by CTA NECK. Acute findings discussed with and reconfirmed by Dr.JOSHUA ZAVITZ on 04/09/2016 at 1:16 am. Electronically Signed   By: Elon Alas M.D.   On: 04/09/2016 01:16   Ct Cervical Spine Wo Contrast  04/09/2016  CLINICAL DATA:  [place], patient does not recall event. Headache and vomiting, neck pain. EXAM: CT HEAD WITHOUT CONTRAST CT CERVICAL SPINE WITHOUT CONTRAST TECHNIQUE: Multidetector CT imaging of the head and cervical spine was performed following the standard protocol without intravenous contrast. Multiplanar CT image reconstructions of the cervical spine were also generated. COMPARISON:  MRI of the cervical spine January 20, 2014 and CT HEAD June 20, 2009. FINDINGS: CT HEAD FINDINGS INTRACRANIAL CONTENTS: The ventricles and sulci are normal for age. No intraparenchymal hemorrhage, mass effect nor midline shift. Patchy supratentorial white matter hypodensities are less than expected for patient's age and though non-specific likely represent chronic small vessel ischemic disease. No acute large vascular territory infarcts. No abnormal extra-axial fluid collections. Basal cisterns are patent. Moderate calcific atherosclerosis of the carotid siphons. ORBITS: The included ocular globes and orbital contents are non-suspicious. SINUSES: Small RIGHT ethmoid mucosal retention cyst. The mastoid aircells and included paranasal sinuses are otherwise well-aerated. SKULL/SOFT TISSUES: No skull fracture. No significant soft tissue swelling. CT CERVICAL SPINE FINDINGS Osseous structures: Acute nondisplaced fracture through C5 bilateral pedicles extending into the facets. New nondisplaced fracture extension through C6-7 vertebra anterior and middle columns, status post C5 through C7 fusion with arthrodesis. The remaining vertebral bodies are intact.  Straightened cervical lordosis. No malalignment. Severe at C3-4 and C4-5 disc height loss with uncovertebral hypertrophy compatible with degenerative disc. Multilevel moderate facet arthropathy. No destructive bony lesions. C1-2 articulation maintained with mild arthropathy. No destructive bony lesions. No significant prevertebral or paraspinal soft tissue swelling. Bilateral lung apical fibronodular scarring. IMPRESSION: CT HEAD: Negative CT HEAD for age. CT CERVICAL SPINE: Acute nondisplaced 3 column fracture through C5 through C7 solid fusion mass without malalignment. As fractures involve the pedicles, vertebral artery injury is possible and could be evaluated by CTA NECK. Acute findings discussed with and reconfirmed by Dr.JOSHUA ZAVITZ on 04/09/2016 at 1:16 am. Electronically Signed   By: Elon Alas M.D.   On: 04/09/2016 01:16     EKG: Independently reviewed.          Assessment/Plan:      75 y.o. female with  Principal Problem:    Multiple fractures of cervical spine ([place]) /Cervical spine fracture, initial encounter   C-Collar Placed   CTA of Neck ordered and pending to rule out Vertebral Artery Dissection   Neurosurgery Consulted Dr Christella Noa   Pain Control   Bedrest    Active Problems:     Syncope and collapse   Cardiac Monitoring  Acute head injury   Monitor   Pain control      A-fib (HCC)   Cardiac Monitoring      Hypokalemia- Mild K+ = 3.3   Replace K+   Check Magnesium Level    DVT Prophylaxis   SCDs    Code Status:     FULL CODE      Family Communication:   Daughter at Bedside   Disposition Plan:    Inpatient Status        Time spent:  [facility] Pager [phone]   If [place] Please Contact the Day Rounding Team MD for [facility]  If 7PM-7AM, Please Contact Night-Floor Coverage  [email or web address] Password [email or web address] 04/09/2016, 3:40 AM     ADDENDUM:   Patient was seen and examined on 04/09/2016

## 2016-04-09 NOTE — Consult Note (Signed)
Reason for Consult:Cervical spine fracture Referring Physician: ED  Elizabeth Odom is an 75 y.o. female.  HPI: whom fell yesterday evening, heard but not witnessed by husband. He found her against the wall, where she had left a hole in the dry wall with the back of her head, with her neck in full flexion with her chin against her chest. He laid her on the floor, and did not move her again. EMS arrived placed her in a cervical hard plastic collar and transported her to the [facility]. In the emergency department she had a normal neurologic examination. Cervical spine CT showed laminar, and pedicular fractures where she had an anterior fusion mass from C5-7.  Past Medical History  Diagnosis Date  . Restless leg     Past Surgical History  Procedure Laterality Date  . Harvest bone graft    . Cervical laminectomy      No family history on file.  Social History:  reports that she has never smoked. She has never used smokeless tobacco. She reports that she does not drink alcohol. Her drug history is not on file.  Allergies:  Allergies  Allergen Reactions  . Sulfur Anaphylaxis    Medications: I have reviewed the patient's current medications.  Results for orders placed or performed during the hospital encounter of 04/08/16 (from the past 48 hour(s))  Ethanol     Status: None   Collection Time: 04/08/16 11:00 PM  Result Value Ref Range   Alcohol, Ethyl (B) <5 <5 mg/dL    Comment:        LOWEST DETECTABLE LIMIT FOR SERUM ALCOHOL IS 5 mg/dL FOR MEDICAL PURPOSES ONLY   Protime-INR     Status: None   Collection Time: 04/08/16 11:00 PM  Result Value Ref Range   Prothrombin Time 14.2 11.6 - 15.2 seconds   INR 1.08 0.00 - 1.49  CBC     Status: Abnormal   Collection Time: 04/08/16 11:00 PM  Result Value Ref Range   WBC 7.8 4.0 - 10.5 K/uL   RBC 3.75 (L) 3.87 - 5.11 MIL/uL   Hemoglobin 11.5 (L) 12.0 - 15.0 g/dL   HCT 34.1 (L) 36.0 - 46.0 %   MCV 90.9 78.0 - 100.0 fL   MCH 30.7  26.0 - 34.0 pg   MCHC 33.7 30.0 - 36.0 g/dL   RDW 13.0 11.5 - 15.5 %   Platelets 238 150 - 400 K/uL  Differential     Status: None   Collection Time: 04/08/16 11:00 PM  Result Value Ref Range   Neutrophils Relative % 73 %   Neutro Abs 5.8 1.7 - 7.7 K/uL   Lymphocytes Relative 17 %   Lymphs Abs 1.3 0.7 - 4.0 K/uL   Monocytes Relative 7 %   Monocytes Absolute 0.5 0.1 - 1.0 K/uL   Eosinophils Relative 2 %   Eosinophils Absolute 0.1 0.0 - 0.7 K/uL   Basophils Relative 1 %   Basophils Absolute 0.0 0.0 - 0.1 K/uL  Comprehensive metabolic panel     Status: Abnormal   Collection Time: 04/08/16 11:00 PM  Result Value Ref Range   Sodium 141 135 - 145 mmol/L   Potassium 3.3 (L) 3.5 - 5.1 mmol/L   Chloride 106 101 - 111 mmol/L   CO2 24 22 - 32 mmol/L   Glucose, Bld 142 (H) 65 - 99 mg/dL   BUN 21 (H) 6 - 20 mg/dL   Creatinine, Ser 0.84 0.44 - 1.00 mg/dL  Calcium 9.2 8.9 - 10.3 mg/dL   Total Protein 6.5 6.5 - 8.1 g/dL   Albumin 3.7 3.5 - 5.0 g/dL   AST 31 15 - 41 U/L   ALT 18 14 - 54 U/L   Alkaline Phosphatase 65 38 - 126 U/L   Total Bilirubin 0.6 0.3 - 1.2 mg/dL   GFR calc non Af Amer >60 >60 mL/min   GFR calc Af Amer >60 >60 mL/min    Comment: (NOTE) The eGFR has been calculated using the CKD EPI equation. This calculation has not been validated in all clinical situations. eGFR's persistently <60 mL/min signify possible Chronic Kidney Disease.    Anion gap 11 5 - 15  Magnesium     Status: None   Collection Time: 04/08/16 11:00 PM  Result Value Ref Range   Magnesium 1.8 1.7 - 2.4 mg/dL  I-stat troponin, ED (not at [facility], [facility])     Status: None   Collection Time: 04/08/16 11:14 PM  Result Value Ref Range   Troponin i, poc 0.00 0.00 - 0.08 ng/mL   Comment 3            Comment: Due to the release kinetics of cTnI, a negative result within the first hours of the onset of symptoms does not rule out myocardial infarction with certainty. If myocardial infarction is still  suspected, repeat the test at appropriate intervals.   I-Stat Chem 8, ED  (not at [facility], [facility])     Status: Abnormal   Collection Time: 04/08/16 11:16 PM  Result Value Ref Range   Sodium 142 135 - 145 mmol/L   Potassium 3.4 (L) 3.5 - 5.1 mmol/L   Chloride 103 101 - 111 mmol/L   BUN 26 (H) 6 - 20 mg/dL   Creatinine, Ser 0.80 0.44 - 1.00 mg/dL   Glucose, Bld 141 (H) 65 - 99 mg/dL   Calcium, Ion 1.15 1.13 - 1.30 mmol/L   TCO2 25 0 - 100 mmol/L   Hemoglobin 12.2 12.0 - 15.0 g/dL   HCT 36.0 36.0 - 46.0 %  Urine rapid drug screen (hosp performed)not at [facility]     Status: None   Collection Time: 04/09/16 12:00 AM  Result Value Ref Range   Opiates NONE DETECTED NONE DETECTED   Cocaine NONE DETECTED NONE DETECTED   Benzodiazepines NONE DETECTED NONE DETECTED   Amphetamines NONE DETECTED NONE DETECTED   Tetrahydrocannabinol NONE DETECTED NONE DETECTED   Barbiturates NONE DETECTED NONE DETECTED    Comment:        DRUG SCREEN FOR MEDICAL PURPOSES ONLY.  IF CONFIRMATION IS NEEDED FOR ANY PURPOSE, NOTIFY LAB WITHIN 5 DAYS.        LOWEST DETECTABLE LIMITS FOR URINE DRUG SCREEN Drug Class       Cutoff (ng/mL) Amphetamine      1000 Barbiturate      200 Benzodiazepine   423 Tricyclics       536 Opiates          300 Cocaine          300 THC              50   Urinalysis, Routine w reflex microscopic (not at [facility])     Status: Abnormal   Collection Time: 04/09/16 12:00 AM  Result Value Ref Range   Color, Urine YELLOW YELLOW   APPearance CLEAR CLEAR   Specific Gravity, Urine 1.026 1.005 - 1.030   pH 5.0 5.0 - 8.0   Glucose, UA 100 (  A) NEGATIVE mg/dL   Hgb urine dipstick NEGATIVE NEGATIVE   Bilirubin Urine NEGATIVE NEGATIVE   Ketones, ur NEGATIVE NEGATIVE mg/dL   Protein, ur NEGATIVE NEGATIVE mg/dL   Nitrite NEGATIVE NEGATIVE   Leukocytes, UA NEGATIVE NEGATIVE    Comment: MICROSCOPIC NOT DONE ON URINES WITH NEGATIVE PROTEIN, BLOOD, LEUKOCYTES, NITRITE, OR GLUCOSE <1000 mg/dL.  Basic  metabolic panel     Status: Abnormal   Collection Time: 04/09/16  6:05 AM  Result Value Ref Range   Sodium 140 135 - 145 mmol/L   Potassium 3.8 3.5 - 5.1 mmol/L   Chloride 109 101 - 111 mmol/L   CO2 24 22 - 32 mmol/L   Glucose, Bld 124 (H) 65 - 99 mg/dL   BUN 16 6 - 20 mg/dL   Creatinine, Ser 0.71 0.44 - 1.00 mg/dL   Calcium 9.0 8.9 - 10.3 mg/dL   GFR calc non Af Amer >60 >60 mL/min   GFR calc Af Amer >60 >60 mL/min    Comment: (NOTE) The eGFR has been calculated using the CKD EPI equation. This calculation has not been validated in all clinical situations. eGFR's persistently <60 mL/min signify possible Chronic Kidney Disease.    Anion gap 7 5 - 15  CBC     Status: Abnormal   Collection Time: 04/09/16  6:05 AM  Result Value Ref Range   WBC 7.2 4.0 - 10.5 K/uL   RBC 3.81 (L) 3.87 - 5.11 MIL/uL   Hemoglobin 11.4 (L) 12.0 - 15.0 g/dL   HCT 34.5 (L) 36.0 - 46.0 %   MCV 90.6 78.0 - 100.0 fL   MCH 29.9 26.0 - 34.0 pg   MCHC 33.0 30.0 - 36.0 g/dL   RDW 13.0 11.5 - 15.5 %   Platelets 239 150 - 400 K/uL    Dg Chest 2 View  04/09/2016  CLINICAL DATA:  Unwitnessed fall tonight. Confusion. Upper back and LEFT shoulder pain. EXAM: CHEST  2 VIEW COMPARISON:  Chest radiograph Apr 26, 2009 FINDINGS: Cardiomediastinal silhouette is normal. Mildly calcified aortic knob. The lungs are clear without pleural effusions or focal consolidations. Trachea projects midline and there is no pneumothorax. Soft tissue planes and included osseous structures are non-suspicious. Catheter projects in retroperitoneum, possibly external to the patient seen only on the lateral radiograph. IMPRESSION: No active cardiopulmonary disease. Electronically Signed   By: Elon Alas M.D.   On: 04/09/2016 00:41   Ct Head Wo Contrast  04/09/2016  CLINICAL DATA:  [place], patient does not recall event. Headache and vomiting, neck pain. EXAM: CT HEAD WITHOUT CONTRAST CT CERVICAL SPINE WITHOUT CONTRAST TECHNIQUE:  Multidetector CT imaging of the head and cervical spine was performed following the standard protocol without intravenous contrast. Multiplanar CT image reconstructions of the cervical spine were also generated. COMPARISON:  MRI of the cervical spine January 20, 2014 and CT HEAD June 20, 2009. FINDINGS: CT HEAD FINDINGS INTRACRANIAL CONTENTS: The ventricles and sulci are normal for age. No intraparenchymal hemorrhage, mass effect nor midline shift. Patchy supratentorial white matter hypodensities are less than expected for patient's age and though non-specific likely represent chronic small vessel ischemic disease. No acute large vascular territory infarcts. No abnormal extra-axial fluid collections. Basal cisterns are patent. Moderate calcific atherosclerosis of the carotid siphons. ORBITS: The included ocular globes and orbital contents are non-suspicious. SINUSES: Small RIGHT ethmoid mucosal retention cyst. The mastoid aircells and included paranasal sinuses are otherwise well-aerated. SKULL/SOFT TISSUES: No skull fracture. No significant soft tissue swelling. CT CERVICAL SPINE  FINDINGS Osseous structures: Acute nondisplaced fracture through C5 bilateral pedicles extending into the facets. New nondisplaced fracture extension through C6-7 vertebra anterior and middle columns, status post C5 through C7 fusion with arthrodesis. The remaining vertebral bodies are intact. Straightened cervical lordosis. No malalignment. Severe at C3-4 and C4-5 disc height loss with uncovertebral hypertrophy compatible with degenerative disc. Multilevel moderate facet arthropathy. No destructive bony lesions. C1-2 articulation maintained with mild arthropathy. No destructive bony lesions. No significant prevertebral or paraspinal soft tissue swelling. Bilateral lung apical fibronodular scarring. IMPRESSION: CT HEAD: Negative CT HEAD for age. CT CERVICAL SPINE: Acute nondisplaced 3 column fracture through C5 through C7 solid fusion  mass without malalignment. As fractures involve the pedicles, vertebral artery injury is possible and could be evaluated by CTA NECK. Acute findings discussed with and reconfirmed by Dr.JOSHUA ZAVITZ on 04/09/2016 at 1:16 am. Electronically Signed   By: Elon Alas M.D.   On: 04/09/2016 01:16   Ct Angio Neck W/cm &/or Wo/cm  04/09/2016  CLINICAL DATA:  Fall at home without memory of what happened. Found by has been. Neck pain and headache. Cervical spine fractures. Evaluate for vertebral artery dissection. EXAM: CT ANGIOGRAPHY NECK TECHNIQUE: Multidetector CT imaging of the neck was performed using the standard protocol during bolus administration of intravenous contrast. Multiplanar CT image reconstructions and MIPs were obtained to evaluate the vascular anatomy. Carotid stenosis measurements (when applicable) are obtained utilizing NASCET criteria, using the distal internal carotid diameter as the denominator. CONTRAST:  50 mL Isovue 370 COMPARISON:  Cervical spine CT earlier today FINDINGS: Aortic arch: 3 vessel aortic arch with mild atherosclerotic plaque. Brachiocephalic and subclavian arteries are patent. There is focal eccentric calcified plaque in the proximal right subclavian artery without significant stenosis. Right carotid system: Patent without evidence of stenosis, dissection, or aneurysm. Left carotid system: Patent without evidence of stenosis, dissection, or aneurysm. Vertebral arteries: Vertebral arteries are patent without evidence of stenosis, dissection, or aneurysm. Left vertebral artery is dominant. Skeleton: Solid C5-C7 intervertebral osseous fusion with fracture extending through the anterior and middle columns at C6-7 and through the pedicles and facets bilaterally at C6 as described on earlier spine CT. Other neck: Bilateral apical lung scarring. IMPRESSION: 1. No evidence of acute traumatic vertebral artery injury. 2. No significant extracranial arterial stenosis. 3. Cervical  spine fractures at C6-7 as previously described. Electronically Signed   By: Logan Bores M.D.   On: 04/09/2016 09:31   Ct Cervical Spine Wo Contrast  04/09/2016  CLINICAL DATA:  [place], patient does not recall event. Headache and vomiting, neck pain. EXAM: CT HEAD WITHOUT CONTRAST CT CERVICAL SPINE WITHOUT CONTRAST TECHNIQUE: Multidetector CT imaging of the head and cervical spine was performed following the standard protocol without intravenous contrast. Multiplanar CT image reconstructions of the cervical spine were also generated. COMPARISON:  MRI of the cervical spine January 20, 2014 and CT HEAD June 20, 2009. FINDINGS: CT HEAD FINDINGS INTRACRANIAL CONTENTS: The ventricles and sulci are normal for age. No intraparenchymal hemorrhage, mass effect nor midline shift. Patchy supratentorial white matter hypodensities are less than expected for patient's age and though non-specific likely represent chronic small vessel ischemic disease. No acute large vascular territory infarcts. No abnormal extra-axial fluid collections. Basal cisterns are patent. Moderate calcific atherosclerosis of the carotid siphons. ORBITS: The included ocular globes and orbital contents are non-suspicious. SINUSES: Small RIGHT ethmoid mucosal retention cyst. The mastoid aircells and included paranasal sinuses are otherwise well-aerated. SKULL/SOFT TISSUES: No skull fracture. No significant soft tissue swelling.  CT CERVICAL SPINE FINDINGS Osseous structures: Acute nondisplaced fracture through C5 bilateral pedicles extending into the facets. New nondisplaced fracture extension through C6-7 vertebra anterior and middle columns, status post C5 through C7 fusion with arthrodesis. The remaining vertebral bodies are intact. Straightened cervical lordosis. No malalignment. Severe at C3-4 and C4-5 disc height loss with uncovertebral hypertrophy compatible with degenerative disc. Multilevel moderate facet arthropathy. No destructive bony lesions.  C1-2 articulation maintained with mild arthropathy. No destructive bony lesions. No significant prevertebral or paraspinal soft tissue swelling. Bilateral lung apical fibronodular scarring. IMPRESSION: CT HEAD: Negative CT HEAD for age. CT CERVICAL SPINE: Acute nondisplaced 3 column fracture through C5 through C7 solid fusion mass without malalignment. As fractures involve the pedicles, vertebral artery injury is possible and could be evaluated by CTA NECK. Acute findings discussed with and reconfirmed by Dr.JOSHUA ZAVITZ on 04/09/2016 at 1:16 am. Electronically Signed   By: Elon Alas M.D.   On: 04/09/2016 01:16    Review of Systems  Eyes: Negative.   Respiratory: Negative.   Cardiovascular: Negative.   Gastrointestinal: Negative.   Genitourinary: Negative.   Musculoskeletal: Positive for neck pain.  Skin: Negative.   Neurological: Positive for weakness.  Endo/Heme/Allergies: Negative.   Psychiatric/Behavioral: Negative.    Blood pressure 115/52, pulse 67, temperature 97.4 F (36.3 C), temperature source Axillary, resp. rate 20, height 5' 7"  (1.702 m), weight 62.324 kg (137 lb 6.4 oz), SpO2 98 %. Physical Exam  Constitutional: She is oriented to person, place, and time. She appears well-developed and well-nourished. No distress.  HENT:  Head: Normocephalic.  Right Ear: External ear normal.  Left Ear: External ear normal.  Nose: Nose normal.  Mouth/Throat: Oropharynx is clear and moist. No oropharyngeal exudate.  Eyes: Conjunctivae and EOM are normal. Pupils are equal, round, and reactive to light.  Neck:  Tender with palpation  Respiratory: Effort normal and breath sounds normal.  GI: Soft. Bowel sounds are normal.  Neurological: She is alert and oriented to person, place, and time. She has normal strength and normal reflexes. She displays normal reflexes. No cranial nerve deficit or sensory deficit. She exhibits normal muscle tone. Coordination normal. She displays no Babinski's  sign on the right side. She displays no Babinski's sign on the left side.  Gait not assessed  Skin: Skin is warm and dry.    Assessment/Plan: Environmental consultant due to pain. Neck is not unstable, fusion is solid. She should heal without difficulty. Syncope is being evaluated.  There are absolutely no indications for operative intervention. I have explained the fracture to the family and also showed them the cervical spine ct.   Lyon Dumont L 04/09/2016, 12:30 PM

## 2016-04-09 NOTE — Plan of Care (Addendum)
Plan of care sign out note   Elizabeth Odom, is a 75 y.o. female, DOB - 10/25/1941, [id number]   Plan of care sign out note   Patient admitted few hours ago with a fall due to a syncopal episode, has had multiple syncopal episodes in the past. Had neck pain and CT scan revealed C5 and C7 fracture.    1. C-spine fracture. Currently in c-collar neurosurgery following. No focal deficits. Defer management of this problem to neurosurgery. Repeat CT angiogram neck stable.    2. Recurrent syncope. Patient has history of paroxysmal atrial fibrillation [place] vasc 2 score will be at least 2, under the care of Dr. Wynonia Lawman, previously has had 30 day event monitor. Also history suggestive of dehydration as she is obsessed with weight loss and does not eat or drink well. We'll monitor orthostatics, monitor on telemetry, requested cardiology to evaluate her. EKG sinus and nonacute. May require another 30 day went monitor. PT eval and treat. Will gently hydrate.  3. Restless leg syndrome. Resume Requip.    Heparin for DVT prophylaxis, family updated bedside.    Filed Vitals:   04/09/16 0400 04/09/16 0415 04/09/16 0516 04/09/16 0900  BP: 123/56 119/63 139/62 115/52  Pulse: 85 85 72 67  Temp:   98.2 F (36.8 C) 97.4 F (36.3 C)  TempSrc:   Oral Axillary  Resp: 19 21 20 20   Height:   5\' 7"  (1.702 m)   Weight:   62.324 kg (137 lb 6.4 oz)   SpO2: 99% 98% 98% 98%        Data Review   Micro Results No results found for this or any previous visit (from the past 240 hour(s)).  Radiology Reports Dg Chest 2 View  04/09/2016  CLINICAL DATA:  Unwitnessed fall tonight. Confusion. Upper back and LEFT shoulder pain. EXAM: CHEST  2 VIEW COMPARISON:  Chest radiograph Apr 26, 2009 FINDINGS: Cardiomediastinal silhouette is normal. Mildly  calcified aortic knob. The lungs are clear without pleural effusions or focal consolidations. Trachea projects midline and there is no pneumothorax. Soft tissue planes and included osseous structures are non-suspicious. Catheter projects in retroperitoneum, possibly external to the patient seen only on the lateral radiograph. IMPRESSION: No active cardiopulmonary disease. Electronically Signed   By: Elon Alas M.D.   On: 04/09/2016 00:41   Ct Head Wo Contrast  04/09/2016  CLINICAL DATA:  [place], patient does not recall event. Headache and vomiting, neck pain. EXAM: CT HEAD WITHOUT CONTRAST CT CERVICAL SPINE WITHOUT CONTRAST TECHNIQUE: Multidetector CT imaging of the head and cervical spine was performed following the standard protocol without intravenous contrast. Multiplanar CT image reconstructions of the cervical spine were also generated. COMPARISON:  MRI of the cervical spine January 20, 2014 and CT HEAD June 20, 2009. FINDINGS: CT HEAD FINDINGS INTRACRANIAL CONTENTS: The ventricles and sulci are normal for age. No intraparenchymal hemorrhage, mass effect nor midline shift. Patchy supratentorial white matter hypodensities are less than expected for patient's age and though non-specific likely represent chronic small vessel  ischemic disease. No acute large vascular territory infarcts. No abnormal extra-axial fluid collections. Basal cisterns are patent. Moderate calcific atherosclerosis of the carotid siphons. ORBITS: The included ocular globes and orbital contents are non-suspicious. SINUSES: Small RIGHT ethmoid mucosal retention cyst. The mastoid aircells and included paranasal sinuses are otherwise well-aerated. SKULL/SOFT TISSUES: No skull fracture. No significant soft tissue swelling. CT CERVICAL SPINE FINDINGS Osseous structures: Acute nondisplaced fracture through C5 bilateral pedicles extending into the facets. New nondisplaced fracture extension through C6-7 vertebra anterior and middle  columns, status post C5 through C7 fusion with arthrodesis. The remaining vertebral bodies are intact. Straightened cervical lordosis. No malalignment. Severe at C3-4 and C4-5 disc height loss with uncovertebral hypertrophy compatible with degenerative disc. Multilevel moderate facet arthropathy. No destructive bony lesions. C1-2 articulation maintained with mild arthropathy. No destructive bony lesions. No significant prevertebral or paraspinal soft tissue swelling. Bilateral lung apical fibronodular scarring. IMPRESSION: CT HEAD: Negative CT HEAD for age. CT CERVICAL SPINE: Acute nondisplaced 3 column fracture through C5 through C7 solid fusion mass without malalignment. As fractures involve the pedicles, vertebral artery injury is possible and could be evaluated by CTA NECK. Acute findings discussed with and reconfirmed by Dr.JOSHUA ZAVITZ on 04/09/2016 at 1:16 am. Electronically Signed   By: Elon Alas M.D.   On: 04/09/2016 01:16   Ct Angio Neck W/cm &/or Wo/cm  04/09/2016  CLINICAL DATA:  Fall at home without memory of what happened. Found by has been. Neck pain and headache. Cervical spine fractures. Evaluate for vertebral artery dissection. EXAM: CT ANGIOGRAPHY NECK TECHNIQUE: Multidetector CT imaging of the neck was performed using the standard protocol during bolus administration of intravenous contrast. Multiplanar CT image reconstructions and MIPs were obtained to evaluate the vascular anatomy. Carotid stenosis measurements (when applicable) are obtained utilizing NASCET criteria, using the distal internal carotid diameter as the denominator. CONTRAST:  50 mL Isovue 370 COMPARISON:  Cervical spine CT earlier today FINDINGS: Aortic arch: 3 vessel aortic arch with mild atherosclerotic plaque. Brachiocephalic and subclavian arteries are patent. There is focal eccentric calcified plaque in the proximal right subclavian artery without significant stenosis. Right carotid system: Patent without evidence  of stenosis, dissection, or aneurysm. Left carotid system: Patent without evidence of stenosis, dissection, or aneurysm. Vertebral arteries: Vertebral arteries are patent without evidence of stenosis, dissection, or aneurysm. Left vertebral artery is dominant. Skeleton: Solid C5-C7 intervertebral osseous fusion with fracture extending through the anterior and middle columns at C6-7 and through the pedicles and facets bilaterally at C6 as described on earlier spine CT. Other neck: Bilateral apical lung scarring. IMPRESSION: 1. No evidence of acute traumatic vertebral artery injury. 2. No significant extracranial arterial stenosis. 3. Cervical spine fractures at C6-7 as previously described. Electronically Signed   By: Logan Bores M.D.   On: 04/09/2016 09:31   Ct Cervical Spine Wo Contrast  04/09/2016  CLINICAL DATA:  [place], patient does not recall event. Headache and vomiting, neck pain. EXAM: CT HEAD WITHOUT CONTRAST CT CERVICAL SPINE WITHOUT CONTRAST TECHNIQUE: Multidetector CT imaging of the head and cervical spine was performed following the standard protocol without intravenous contrast. Multiplanar CT image reconstructions of the cervical spine were also generated. COMPARISON:  MRI of the cervical spine January 20, 2014 and CT HEAD June 20, 2009. FINDINGS: CT HEAD FINDINGS INTRACRANIAL CONTENTS: The ventricles and sulci are normal for age. No intraparenchymal hemorrhage, mass effect nor midline shift. Patchy supratentorial white matter hypodensities are less than expected for patient's age and though non-specific likely represent  chronic small vessel ischemic disease. No acute large vascular territory infarcts. No abnormal extra-axial fluid collections. Basal cisterns are patent. Moderate calcific atherosclerosis of the carotid siphons. ORBITS: The included ocular globes and orbital contents are non-suspicious. SINUSES: Small RIGHT ethmoid mucosal retention cyst. The mastoid aircells and included paranasal  sinuses are otherwise well-aerated. SKULL/SOFT TISSUES: No skull fracture. No significant soft tissue swelling. CT CERVICAL SPINE FINDINGS Osseous structures: Acute nondisplaced fracture through C5 bilateral pedicles extending into the facets. New nondisplaced fracture extension through C6-7 vertebra anterior and middle columns, status post C5 through C7 fusion with arthrodesis. The remaining vertebral bodies are intact. Straightened cervical lordosis. No malalignment. Severe at C3-4 and C4-5 disc height loss with uncovertebral hypertrophy compatible with degenerative disc. Multilevel moderate facet arthropathy. No destructive bony lesions. C1-2 articulation maintained with mild arthropathy. No destructive bony lesions. No significant prevertebral or paraspinal soft tissue swelling. Bilateral lung apical fibronodular scarring. IMPRESSION: CT HEAD: Negative CT HEAD for age. CT CERVICAL SPINE: Acute nondisplaced 3 column fracture through C5 through C7 solid fusion mass without malalignment. As fractures involve the pedicles, vertebral artery injury is possible and could be evaluated by CTA NECK. Acute findings discussed with and reconfirmed by Dr.JOSHUA ZAVITZ on 04/09/2016 at 1:16 am. Electronically Signed   By: Elon Alas M.D.   On: 04/09/2016 01:16    CBC  Recent Labs Lab 04/08/16 2300 04/08/16 2316 04/09/16 0605  WBC 7.8  --  7.2  HGB 11.5* 12.2 11.4*  HCT 34.1* 36.0 34.5*  PLT 238  --  239  MCV 90.9  --  90.6  MCH 30.7  --  29.9  MCHC 33.7  --  33.0  RDW 13.0  --  13.0  LYMPHSABS 1.3  --   --   MONOABS 0.5  --   --   EOSABS 0.1  --   --   BASOSABS 0.0  --   --     Chemistries   Recent Labs Lab 04/08/16 2300 04/08/16 2316 04/09/16 0605  NA 141 142 140  K 3.3* 3.4* 3.8  CL 106 103 109  CO2 24  --  24  GLUCOSE 142* 141* 124*  BUN 21* 26* 16  CREATININE 0.84 0.80 0.71  CALCIUM 9.2  --  9.0  MG 1.8  --   --   AST 31  --   --   ALT 18  --   --   ALKPHOS 65  --   --     BILITOT 0.6  --   --    ------------------------------------------------------------------------------------------------------------------ estimated creatinine clearance is 59.1 mL/min (by C-G formula based on Cr of 0.71). ------------------------------------------------------------------------------------------------------------------ No results for input(s): HGBA1C in the last 72 hours. ------------------------------------------------------------------------------------------------------------------ No results for input(s): CHOL, HDL, LDLCALC, TRIG, CHOLHDL, LDLDIRECT in the last 72 hours. ------------------------------------------------------------------------------------------------------------------ No results for input(s): TSH, T4TOTAL, T3FREE, THYROIDAB in the last 72 hours.  Invalid input(s): FREET3 ------------------------------------------------------------------------------------------------------------------ No results for input(s): VITAMINB12, FOLATE, FERRITIN, TIBC, IRON, RETICCTPCT in the last 72 hours.  Coagulation profile  Recent Labs Lab 04/08/16 2300  INR 1.08    No results for input(s): DDIMER in the last 72 hours.  Cardiac Enzymes No results for input(s): CKMB, TROPONINI, MYOGLOBIN in the last 168 hours.  Invalid input(s): CK ------------------------------------------------------------------------------------------------------------------ Invalid input(s): POCBNP

## 2016-04-09 NOTE — ED Notes (Signed)
Dr. Jenkins at bedside. 

## 2016-04-09 NOTE — Progress Notes (Signed)
Orthopedic Tech Progress Note Patient Details:  [name] [birth year] [id number]  Patient ID: Elizabeth Odom, female   DOB: March 18, 1941, 75 y.o.   MRN: [id number] Called in bio-tech brace order; spoke with Leona Singleton, Shianne Zeiser 04/09/2016, 3:07 PM

## 2016-04-09 NOTE — ED Notes (Signed)
attemtped report

## 2016-04-09 NOTE — ED Provider Notes (Signed)
Patient/family updated on delay No distress She moves all extremitiesx4 Plan to get CTA neck then admit to [facility] D/w dr Prescott Gum, MD 04/09/16 [phone]

## 2016-04-10 ENCOUNTER — Encounter (HOSPITAL_COMMUNITY): Payer: Self-pay | Admitting: Physician Assistant

## 2016-04-10 ENCOUNTER — Encounter (HOSPITAL_COMMUNITY)
Admission: EM | Disposition: A | Discharge status: Home / Self Care | Payer: Self-pay | Source: Home / Self Care | Attending: Emergency Medicine

## 2016-04-10 ENCOUNTER — Inpatient Hospital Stay (HOSPITAL_COMMUNITY): Payer: PPO

## 2016-04-10 DIAGNOSIS — S129XXS Fracture of neck, unspecified, sequela: Secondary | ICD-10-CM

## 2016-04-10 DIAGNOSIS — R55 Syncope and collapse: Principal | ICD-10-CM

## 2016-04-10 HISTORY — PX: EP IMPLANTABLE DEVICE: SHX172B

## 2016-04-10 LAB — ECHOCARDIOGRAM COMPLETE
HEIGHTINCHES: 67 in
Weight: 2198.4 oz

## 2016-04-10 LAB — CBC
HEMATOCRIT: 34.9 % — AB (ref 36.0–46.0)
Hemoglobin: 11.3 g/dL — ABNORMAL LOW (ref 12.0–15.0)
MCH: 29.2 pg (ref 26.0–34.0)
MCHC: 32.4 g/dL (ref 30.0–36.0)
MCV: 90.2 fL (ref 78.0–100.0)
Platelets: 205 10*3/uL (ref 150–400)
RBC: 3.87 MIL/uL (ref 3.87–5.11)
RDW: 13 % (ref 11.5–15.5)
WBC: 5.9 10*3/uL (ref 4.0–10.5)

## 2016-04-10 LAB — CREATININE, SERUM
Creatinine, Ser: 0.86 mg/dL (ref 0.44–1.00)
GFR calc Af Amer: >60 mL/min (ref >60)
GFR calc non Af Amer: >60 mL/min (ref >60)

## 2016-04-10 SURGERY — LOOP RECORDER INSERTION

## 2016-04-10 MED ORDER — ACETAMINOPHEN 325 MG PO TABS: 325.0000 mg | ORAL_TABLET | ORAL | Status: DC | PRN

## 2016-04-10 MED ORDER — LIDOCAINE-EPINEPHRINE 1 %-1:100000 IJ SOLN
INTRAMUSCULAR | Status: AC
  Filled 2016-04-10: qty 1

## 2016-04-10 MED ORDER — ONDANSETRON HCL 4 MG/2ML IJ SOLN: 4.0000 mg | Freq: Four times a day (QID) | INTRAMUSCULAR | Status: DC | PRN

## 2016-04-10 MED ORDER — LIDOCAINE-EPINEPHRINE 1 %-1:100000 IJ SOLN
INTRAMUSCULAR | Status: DC | PRN
  Administered 2016-04-10: 15 mL

## 2016-04-10 MED ORDER — LIDOCAINE HCL (PF) 1 % IJ SOLN
INTRAMUSCULAR | Status: AC
  Filled 2016-04-10: qty 30

## 2016-04-10 SURGICAL SUPPLY — 2 items
LOOP REVEAL LINQSYS (Prosthesis & Implant Heart) ×3 IMPLANT
PACK LOOP INSERTION (CUSTOM PROCEDURE TRAY) ×3 IMPLANT

## 2016-04-10 NOTE — Progress Notes (Signed)
Pt to go home via wheelchair accompanied by daughter. All discharge instructions reviewed with pt.

## 2016-04-10 NOTE — Progress Notes (Signed)
PT Cancellation Note  Patient Details Name: Elizabeth Odom MRN: [id number] DOB: 1941-08-26   Cancelled Treatment:    Reason Eval/Treat Not Completed: Patient at procedure or test/unavailable. Pt being transported to test, will recheck as able.    Cassell Clement, PT, CSCS Pager [phone] Office [phone]  04/10/2016, 10:37 AM

## 2016-04-10 NOTE — Progress Notes (Signed)
Subjective:  She has no recollection of the visit yesterday.  No recurrent dizziness today, no shortness of breath or chest pain.  Discussed the loop monitor with her.  Objective:  Vital Signs in the last 24 hours: BP 134/65 mmHg  Pulse 68  Temp(Src) 98.4 F (36.9 C) (Oral)  Resp 20  Ht 5\' 7"  (1.702 m)  Wt 62.324 kg (137 lb 6.4 oz)  BMI 21.51 kg/m2  SpO2 98%  Physical Exam: Pleasant female in no acute distress Lungs:  Clear  Cardiac:  Regular rhythm, normal S1 and S2, no S3 Abdomen:  Soft, nontender, no masses Extremities:  No edema present  Intake/Output from previous day:   Weight Filed Weights   04/09/16 0516  Weight: 62.324 kg (137 lb 6.4 oz)    Lab Results: Basic Metabolic Panel:  Recent Labs  04/08/16 2300 04/08/16 2316 04/09/16 0605  NA 141 142 140  K 3.3* 3.4* 3.8  CL 106 103 109  CO2 24  --  24  GLUCOSE 142* 141* 124*  BUN 21* 26* 16  CREATININE 0.84 0.80 0.71    CBC:  Recent Labs  04/08/16 2300 04/08/16 2316 04/09/16 0605  WBC 7.8  --  7.2  NEUTROABS 5.8  --   --   HGB 11.5* 12.2 11.4*  HCT 34.1* 36.0 34.5*  MCV 90.9  --  90.6  PLT 238  --  239   Telemetry: Sinus with PACs, and no sustained tach arrhythmias and no bradycardia noted  Assessment/Plan:  1.  Syncopal episode of undetermined etiology 2.  PACs 3.  Cervical disc disease with previous surgery  Recommendations:  No orthostatic changes and no arrhythmias.  Echocardiogram reviewed did not show any structural cause for syncope.  She has had an implanted loop monitor and will be continued to be monitored for arrhythmias.     Kerry Hough  MD [facility]  04/10/2016, 1:38 PM

## 2016-04-10 NOTE — Discharge Instructions (Addendum)
Cardiac Event Monitoring A cardiac event monitor is a small recording device used to help detect abnormal heart rhythms (arrhythmias). The monitor is used to record heart rhythm when noticeable symptoms such as the following occur:  Fast heartbeats (palpitations), such as heart racing or fluttering.  Dizziness.  Fainting or light-headedness.  Unexplained weakness. The monitor is wired to two electrodes placed on your chest. Electrodes are flat, sticky disks that attach to your skin. The monitor can be worn for up to 30 days. You will wear the monitor at all times, except when bathing.  HOW TO USE YOUR CARDIAC EVENT MONITOR A technician will prepare your chest for the electrode placement. The technician will show you how to place the electrodes, how to work the monitor, and how to replace the batteries. Take time to practice using the monitor before you leave the office. Make sure you understand how to send the information from the monitor to your health care provider. This requires a telephone with a landline, not a cell phone. You need to:  Wear your monitor at all times, except when you are in water:  Do not get the monitor wet.  Take the monitor off when bathing. Do not swim or use a hot tub with it on.  Keep your skin clean. Do not put body lotion or moisturizer on your chest.  Change the electrodes daily or any time they stop sticking to your skin. You might need to use tape to keep them on.  It is possible that your skin under the electrodes could become irritated. To keep this from happening, try to put the electrodes in slightly different places on your chest. However, they must remain in the area under your left breast and in the upper right section of your chest.  Make sure the monitor is safely clipped to your clothing or in a location close to your body that your health care provider recommends.  Press the button to record when you feel symptoms of heart trouble, such as  dizziness, weakness, light-headedness, palpitations, thumping, shortness of breath, unexplained weakness, or a fluttering or racing heart. The monitor is always on and records what happened slightly before you pressed the button, so do not worry about being too late to get good information.  Keep a diary of your activities, such as walking, doing chores, and taking medicine. It is especially important to note what you were doing when you pushed the button to record your symptoms. This will help your health care provider determine what might be contributing to your symptoms. The information stored in your monitor will be reviewed by your health care provider alongside your diary entries.  Send the recorded information as recommended by your health care provider. It is important to understand that it will take some time for your health care provider to process the results.  Change the batteries as recommended by your health care provider. SEEK IMMEDIATE MEDICAL CARE IF:   You have chest pain.  You have extreme difficulty breathing or shortness of breath.  You develop a very fast heartbeat that persists.  You develop dizziness that does not go away.  You faint or constantly feel you are about to faint.   This information is not intended to replace advice given to you by your health care provider. Make sure you discuss any questions you have with your health care provider.   Document Released: 09/05/2008 Document Revised: 12/18/2014 Document Reviewed: 05/26/2013 Elsevier Interactive Patient Education [employer].  LOOP recorder wound site care Keep clean and dry for 3 days, do not get the wound wet for 3 days.

## 2016-04-10 NOTE — Care Management Note (Signed)
Case Management Note  Patient Details  Name: Elizabeth Odom MRN: [id number] Date of Birth: 1941/09/07  Subjective/Objective:                    Action/Plan: Patient presented with cervical fractures, s/p fall related to a syncopal episode.  Lives at home with spouse.  Will follow for discharge needs.  Expected Discharge Date:  04/13/16               Expected Discharge Plan:     In-House Referral:     Discharge planning Services     Post Acute Care Choice:    Choice offered to:     DME Arranged:    DME Agency:     HH Arranged:    HH Agency:     Status of Service:  In process, will continue to follow  Medicare Important Message Given:    Date Medicare IM Given:    Medicare IM give by:    Date Additional Medicare IM Given:    Additional Medicare Important Message give by:     If discussed at [place] of Stay Meetings, dates discussed:    Additional CommentsRolm Baptise, RN 04/10/2016, 2:05 PM [phone]

## 2016-04-10 NOTE — Consult Note (Signed)
ELECTROPHYSIOLOGY CONSULT NOTE    Patient ID: Elizabeth Odom MRN: [id number], DOB/AGE: 11-Aug-1941 75 y.o.  Admit date: 04/08/2016 Date of Consult: 04/10/2016  Primary Physician: No primary care provider on file. Primary Cardiologist: Dr. Wynonia Lawman Requesting Physician: Dr. Wynonia Lawman  Reason for Consultation: evaluate for loop recorder implant  HPI: Elizabeth Odom is a 75 y.o. female admitted 04/08/16 after a syncopal event, with PMHx of ?PAFib, and RLS on Requip only at home for her RLS.  She was to start a investigational medicine for this but has not yet.    Was brought to [facility] after a syncopal event with trauma.  C spine CT showed laminar, and pedicular fractures where she had an anterior fusion mass from C5-7.  She has been seen by neurosurgery who stated her C spine is stable and no surgical intervention is needed.  The HPI is obtained from the chart and her daughter at bedside who relates the story as it was told to her, the patient does not recall the event.  The patient had been helping her granddaughter in her yard but all day felt a little "off", not really feeling her usual self, feeling a bit dizzy upon standing, but not near syncopal, her daughter states had not eaten or drank much that day, a couple glasses of tea only.  She eventually late in the day went to a baseball game and once home getting ready for bed, stood to check her cell phone and apparently fainted.  Her husband heard a loud noise and went in to find her slumped on the floor.  She did eventually wake but even into tomorrow was disoriented.  Her daughter states yesterday she was saying she though a sister had visited her here (who had not).  But later yesterday cleared up finally.  The patient has had a death of a brother and sister in the last few months and her husband recently found with renal cancer and mentions she has been under a significant amount of emotional stress, not sleeping well.    The patient had a syncopal  event about a year ago while on the toilet, no clear w/u after that event.  She saw Dr. Wynonia Lawman in 2010 his note mentions for dizziness and palpitations with a holter that did not disclose any significant findings, only bradycardia in the 50's.  She reports a umber of spells over the years, can happen sitting, where she will feel like her left arm and shoulder feel numb, (sometimes not this componnet) and suddenly her heart is racing and pounding so hard she can see her blouse moving, then sits back or tries to relax and it feels like it suddenly slows, and is left feeling very tired the rest of the day.   Past Medical History  Diagnosis Date  . Restless leg      Surgical History:  Past Surgical History  Procedure Laterality Date  . Harvest bone graft    . Cervical laminectomy       Prescriptions prior to admission  Medication Sig Dispense Refill Last Dose  . rOPINIRole (REQUIP) 1 MG tablet Take 1-2 tablets by mouth See admin instructions. Pt takes 1 tablet in the morning, and 2 tablets at bedtime  2 04/08/2016 at Unknown time    Inpatient Medications:  . heparin subcutaneous  5,000 Units Subcutaneous Q8H  . rOPINIRole  1 mg Oral q morning - 10a   And  . rOPINIRole  2 mg Oral QHS  . sodium  chloride flush  3 mL Intravenous Q12H    Allergies:  Allergies  Allergen Reactions  . Sulfur Anaphylaxis    Social History   Social History  . Marital Status: Married    Spouse Name: N/A  . Number of Children: N/A  . Years of Education: N/A   Occupational History  . Not on file.   Social History Main Topics  . Smoking status: Never Smoker   . Smokeless tobacco: Never Used  . Alcohol Use: No  . Drug Use: Not on file  . Sexual Activity: Not on file   Other Topics Concern  . Not on file   Social History Narrative     Family History  Problem Relation Age of Onset  . Cancer Sister   . Diabetes Sister   . Stroke Daughter   . Thyroid disease Daughter      Review of  Systems: All other systems reviewed and are otherwise negative except as noted above.  Physical Exam: Filed Vitals:   04/09/16 2214 04/10/16 0218 04/10/16 0644 04/10/16 1029  BP: 119/47 119/57 116/57 134/65  Pulse: 70 67 72 68  Temp: 98 F (36.7 C) 98 F (36.7 C) 98.6 F (37 C) 98.4 F (36.9 C)  TempSrc: Oral Oral Oral Oral  Resp: 20 18 18 20   Height:      Weight:      SpO2: 96% 98% 95% 98%    GEN- The patient is well appearing, alert and oriented x 3 today.   HEENT: normocephalic, atraumatic; sclera clear, conjunctiva pink; hearing intact; oropharynx clear; neck with C-collar Lymph- no cervical lymphadenopathy Lungs- Clear to ausculation bilaterally, normal work of breathing.  No wheezes, rales, rhonchi Heart- Regular rate and rhythm, no murmurs, rubs or gallops, PMI not laterally displaced GI- soft, non-tender, non-distended, bowel sounds present Extremities- no clubbing, cyanosis, or edema MS- no significant deformity or atrophy Skin- warm and dry, no rash or lesion Psych- euthymic mood, full affect Neuro- no gross deficits observed  Labs:   Lab Results  Component Value Date   WBC 7.2 04/09/2016   HGB 11.4* 04/09/2016   HCT 34.5* 04/09/2016   MCV 90.6 04/09/2016   PLT 239 04/09/2016    Recent Labs Lab 04/08/16 2300  04/09/16 0605  NA 141  < > 140  K 3.3*  < > 3.8  CL 106  < > 109  CO2 24  --  24  BUN 21*  < > 16  CREATININE 0.84  < > 0.71  CALCIUM 9.2  --  9.0  PROT 6.5  --   --   BILITOT 0.6  --   --   ALKPHOS 65  --   --   ALT 18  --   --   AST 31  --   --   GLUCOSE 142*  < > 124*  < > = values in this interval not displayed.    Radiology/Studies:  Dg Chest 2 View 04/09/2016  CLINICAL DATA:  Unwitnessed fall tonight. Confusion. Upper back and LEFT shoulder pain. EXAM: CHEST  2 VIEW COMPARISON:  Chest radiograph Apr 26, 2009 FINDINGS: Cardiomediastinal silhouette is normal. Mildly calcified aortic knob. The lungs are clear without pleural effusions  or focal consolidations. Trachea projects midline and there is no pneumothorax. Soft tissue planes and included osseous structures are non-suspicious. Catheter projects in retroperitoneum, possibly external to the patient seen only on the lateral radiograph. IMPRESSION: No active cardiopulmonary disease. Electronically Signed   By: Elon Alas  M.D.   On: 04/09/2016 00:41   Ct Head Wo Contrast 04/09/2016  CLINICAL DATA:  [place], patient does not recall event. Headache and vomiting, neck pain. EXAM: CT HEAD WITHOUT CONTRAST CT CERVICAL SPINE WITHOUT CONTRAST TECHNIQUE: Multidetector CT imaging of the head and cervical spine was performed following the standard protocol without intravenous contrast. Multiplanar CT image reconstructions of the cervical spine were also generated. COMPARISON:  MRI of the cervical spine January 20, 2014 and CT HEAD June 20, 2009. FINDINGS: CT HEAD FINDINGS INTRACRANIAL CONTENTS: The ventricles and sulci are normal for age. No intraparenchymal hemorrhage, mass effect nor midline shift. Patchy supratentorial white matter hypodensities are less than expected for patient's age and though non-specific likely represent chronic small vessel ischemic disease. No acute large vascular territory infarcts. No abnormal extra-axial fluid collections. Basal cisterns are patent. Moderate calcific atherosclerosis of the carotid siphons. ORBITS: The included ocular globes and orbital contents are non-suspicious. SINUSES: Small RIGHT ethmoid mucosal retention cyst. The mastoid aircells and included paranasal sinuses are otherwise well-aerated. SKULL/SOFT TISSUES: No skull fracture. No significant soft tissue swelling. CT CERVICAL SPINE FINDINGS Osseous structures: Acute nondisplaced fracture through C5 bilateral pedicles extending into the facets. New nondisplaced fracture extension through C6-7 vertebra anterior and middle columns, status post C5 through C7 fusion with arthrodesis. The remaining  vertebral bodies are intact. Straightened cervical lordosis. No malalignment. Severe at C3-4 and C4-5 disc height loss with uncovertebral hypertrophy compatible with degenerative disc. Multilevel moderate facet arthropathy. No destructive bony lesions. C1-2 articulation maintained with mild arthropathy. No destructive bony lesions. No significant prevertebral or paraspinal soft tissue swelling. Bilateral lung apical fibronodular scarring. IMPRESSION: CT HEAD: Negative CT HEAD for age. CT CERVICAL SPINE: Acute nondisplaced 3 column fracture through C5 through C7 solid fusion mass without malalignment. As fractures involve the pedicles, vertebral artery injury is possible and could be evaluated by CTA NECK. Acute findings discussed with and reconfirmed by Dr.JOSHUA ZAVITZ on 04/09/2016 at 1:16 am. Electronically Signed   By: Elon Alas M.D.   On: 04/09/2016 01:16   Ct Angio Neck W/cm &/or Wo/cm 04/09/2016  CLINICAL DATA:  Fall at home without memory of what happened. Found by has been. Neck pain and headache. Cervical spine fractures. Evaluate for vertebral artery dissection. EXAM: CT ANGIOGRAPHY NECK TECHNIQUE: Multidetector CT imaging of the neck was performed using the standard protocol during bolus administration of intravenous contrast. Multiplanar CT image reconstructions and MIPs were obtained to evaluate the vascular anatomy. Carotid stenosis measurements (when applicable) are obtained utilizing NASCET criteria, using the distal internal carotid diameter as the denominator. CONTRAST:  50 mL Isovue 370 COMPARISON:  Cervical spine CT earlier today FINDINGS: Aortic arch: 3 vessel aortic arch with mild atherosclerotic plaque. Brachiocephalic and subclavian arteries are patent. There is focal eccentric calcified plaque in the proximal right subclavian artery without significant stenosis. Right carotid system: Patent without evidence of stenosis, dissection, or aneurysm. Left carotid system: Patent without  evidence of stenosis, dissection, or aneurysm. Vertebral arteries: Vertebral arteries are patent without evidence of stenosis, dissection, or aneurysm. Left vertebral artery is dominant. Skeleton: Solid C5-C7 intervertebral osseous fusion with fracture extending through the anterior and middle columns at C6-7 and through the pedicles and facets bilaterally at C6 as described on earlier spine CT. Other neck: Bilateral apical lung scarring. IMPRESSION: 1. No evidence of acute traumatic vertebral artery injury. 2. No significant extracranial arterial stenosis. 3. Cervical spine fractures at C6-7 as previously described. Electronically Signed   By: Logan Bores  M.D.   On: 04/09/2016 09:31     EKG: SR TELEMETRY: SR, no bradycardia noted, no arrhythmias, + APC's Echocardiogram: pending  Assessment and Plan:   1. Syncope     Patient reports a long history or dizzy spells and palpitations, the record mentions AFib in her PMHx but the patient denies ever being diagnosed with any irregular heart rhythms, cardiology consult states no history of AF     She reports in the last year her palpitations being more frequent as well as dizzy spells, and these do seem to happen together.  Her echocardiogram is pending Orthostatic BP checks here have been negative Labs are unremarkable  She has history of syncope while on the commode, and years of palpitations with a racing sensation followed by the feeling like her heart is very slow, this often associated with dizzy spells.  Though she has had a number of "spells" while sitting.  Possibly syncope is multifactorial , long history of palpitations and dizzy spells with unrevealing monitor done in 2010 with Dr. Wynonia Lawman.   Dr. Lovena Le to see.     Venetia Night, PA-C 04/10/2016 11:35 AM  EP Attending  Patient seen and examined. Agree with the findings as noted by Tommye Standard, PA-C. Her findings reflect mine. The patient has unexplained syncope. Her exam is  notable for her wearing a cervical collar. Tele monitoring has been unremarkable and I have discussed the risks/benefits/goals/expectations of ILR insertion and she wishes to proceed.  Mikle Bosworth.D.

## 2016-04-10 NOTE — Progress Notes (Signed)
Orthopedic Tech Progress Note Patient Details:  Elizabeth Odom [birth year] [id number]  Ortho Devices Type of Ortho Device: Soft collar Ortho Device/Splint Interventions: Application   [name] 04/10/2016, 2:05 PM

## 2016-04-10 NOTE — CV Procedure (Signed)
EP procedure note  Procedure performed: Insertion of an implantable loop recorder  Preoperative diagnosis: Unexplained syncope  Postoperative diagnosis: Unexplained syncope  Description of the procedure: After informed consent was obtained, the patient was taken to the holding area in the fasting state. After the usual preparation and draping, 20 cc of lidocaine was infiltrated into the left pectoral region. A 1 cm stab incision was carried out. The Medtronic implantable loop recorder, serial number [id number] was inserted under the skin. R waves measured 0.3 mV. Benzoin and Steri-Strips were painted on the skin. A dressing was applied. The patient was returned to her room in satisfactory condition.  Estimated blood loss: Less than 5 cc  Complications: There were no immediate procedure complications  Conclusion: Successful insertion of a Medtronic implantable loop recorder in a patient with unexplained syncope.  [name], M.D.

## 2016-04-10 NOTE — Discharge Summary (Signed)
Physician Discharge Summary  JARON WINBERRY [id number] DOB: Mar 18, 1941 DOA: 04/08/2016  PCP: pt will follow up with neurosurgery and cardiology per scheduled appt  Admit date: 04/08/2016 Discharge date: 04/10/2016  Recommendations for Outpatient Follow-up:  1. Pt will follow up with neurosurgery and cardio per scheduled appt 2. No changes in medications on discharge other than tramadol on as needed basis for pain control   Discharge Diagnoses:  Principal Problem:   Multiple fractures of cervical spine ([place]) Active Problems:   Cervical spine fracture, initial encounter   Syncope and collapse   A-fib (HCC)   Acute head injury    Discharge Condition: stable   Diet recommendation: as tolerated   History of present illness:  Per HPI "75 y.o. female with a history of Atrial Fibrillation and Syncope who was brought to the ED by EMS after her husband heard her fall and found her against the wall. She can not give the history because she does not remember what happened. She had passed out and when she came around she had complaints of headache and neck pain. In the ED, she was evalauted and found to have an acute non-displaced fractures of the C-Spine levels C5-C-7, of which she had a previous C-Spine fusion. Dr. Christella Noa of Neurosurgery was consulted and is following the patient. A Cervical Collar was placed and a CTA of the Neck was also ordered to evaluate to rule out a dissection of the vertebral artery."  Hospital Course:   Principal Problem:   Multiple fractures of cervical spine ([place]) / Cervical spine fracture, initial encounter .- Has neck collar in place - Will follow up with neurosurgery per scheduled appt - Pain management with tramadol as needed   Active Problems:   Syncope and collapse - Loop recorder placed prior to discharge - I spoke with cardio who will see her in follow up in their office - Ambulates without difficulty    Signed:  Leisa Lenz,  MD  [facility] 04/10/2016, 4:01 PM  Pager #: [phone]  Time spent in minutes: less than 30 minutes   Consultations:  Cardiology  Neurosurgery   Discharge Exam: Filed Vitals:   04/10/16 0644 04/10/16 1029  BP: 116/57 134/65  Pulse: 72 68  Temp: 98.6 F (37 C) 98.4 F (36.9 C)  Resp: 18 20   Filed Vitals:   04/09/16 2214 04/10/16 0218 04/10/16 0644 04/10/16 1029  BP: 119/47 119/57 116/57 134/65  Pulse: 70 67 72 68  Temp: 98 F (36.7 C) 98 F (36.7 C) 98.6 F (37 C) 98.4 F (36.9 C)  TempSrc: Oral Oral Oral Oral  Resp: 20 18 18 20   Height:      Weight:      SpO2: 96% 98% 95% 98%    General: Pt is alert, follows commands appropriately, not in acute distress Cardiovascular: Regular rate and rhythm, S1/S2 +, no murmurs Respiratory: Clear to auscultation bilaterally, no wheezing, no crackles, no rhonchi Abdominal: Soft, non tender, non distended, bowel sounds +, no guarding Extremities: no edema, no cyanosis, pulses palpable bilaterally DP and PT Neuro: Grossly nonfocal  Discharge Instructions  Discharge Instructions    Call MD for:  hives    Complete by:  As directed      Call MD for:  persistant dizziness or light-headedness    Complete by:  As directed      Call MD for:  persistant nausea and vomiting    Complete by:  As directed      Call  MD for:  redness, tenderness, or signs of infection (pain, swelling, redness, odor or green/yellow discharge around incision site)    Complete by:  As directed      Diet - low sodium heart healthy    Complete by:  As directed      Increase activity slowly    Complete by:  As directed             Medication List    TAKE these medications        rOPINIRole 1 MG tablet  Commonly known as:  REQUIP  Take 1-2 tablets by mouth See admin instructions. Pt takes 1 tablet in the morning, and 2 tablets at bedtime           Follow-up Information    Follow up with [facility].   Specialty:   Cardiology   Contact information:   [address] [place] [address] [phone]       The results of significant diagnostics from this hospitalization (including imaging, microbiology, ancillary and laboratory) are listed below for reference.    Significant Diagnostic Studies: Dg Chest 2 View  04/09/2016  CLINICAL DATA:  Unwitnessed fall tonight. Confusion. Upper back and LEFT shoulder pain. EXAM: CHEST  2 VIEW COMPARISON:  Chest radiograph Apr 26, 2009 FINDINGS: Cardiomediastinal silhouette is normal. Mildly calcified aortic knob. The lungs are clear without pleural effusions or focal consolidations. Trachea projects midline and there is no pneumothorax. Soft tissue planes and included osseous structures are non-suspicious. Catheter projects in retroperitoneum, possibly external to the patient seen only on the lateral radiograph. IMPRESSION: No active cardiopulmonary disease. Electronically Signed   By: Elon Alas M.D.   On: 04/09/2016 00:41   Ct Head Wo Contrast  04/09/2016  CLINICAL DATA:  [place], patient does not recall event. Headache and vomiting, neck pain. EXAM: CT HEAD WITHOUT CONTRAST CT CERVICAL SPINE WITHOUT CONTRAST TECHNIQUE: Multidetector CT imaging of the head and cervical spine was performed following the standard protocol without intravenous contrast. Multiplanar CT image reconstructions of the cervical spine were also generated. COMPARISON:  MRI of the cervical spine January 20, 2014 and CT HEAD June 20, 2009. FINDINGS: CT HEAD FINDINGS INTRACRANIAL CONTENTS: The ventricles and sulci are normal for age. No intraparenchymal hemorrhage, mass effect nor midline shift. Patchy supratentorial white matter hypodensities are less than expected for patient's age and though non-specific likely represent chronic small vessel ischemic disease. No acute large vascular territory infarcts. No abnormal extra-axial fluid collections. Basal cisterns are  patent. Moderate calcific atherosclerosis of the carotid siphons. ORBITS: The included ocular globes and orbital contents are non-suspicious. SINUSES: Small RIGHT ethmoid mucosal retention cyst. The mastoid aircells and included paranasal sinuses are otherwise well-aerated. SKULL/SOFT TISSUES: No skull fracture. No significant soft tissue swelling. CT CERVICAL SPINE FINDINGS Osseous structures: Acute nondisplaced fracture through C5 bilateral pedicles extending into the facets. New nondisplaced fracture extension through C6-7 vertebra anterior and middle columns, status post C5 through C7 fusion with arthrodesis. The remaining vertebral bodies are intact. Straightened cervical lordosis. No malalignment. Severe at C3-4 and C4-5 disc height loss with uncovertebral hypertrophy compatible with degenerative disc. Multilevel moderate facet arthropathy. No destructive bony lesions. C1-2 articulation maintained with mild arthropathy. No destructive bony lesions. No significant prevertebral or paraspinal soft tissue swelling. Bilateral lung apical fibronodular scarring. IMPRESSION: CT HEAD: Negative CT HEAD for age. CT CERVICAL SPINE: Acute nondisplaced 3 column fracture through C5 through C7 solid fusion mass without malalignment.  As fractures involve the pedicles, vertebral artery injury is possible and could be evaluated by CTA NECK. Acute findings discussed with and reconfirmed by Dr.JOSHUA ZAVITZ on 04/09/2016 at 1:16 am. Electronically Signed   By: Elon Alas M.D.   On: 04/09/2016 01:16   Ct Angio Neck W/cm &/or Wo/cm  04/09/2016  CLINICAL DATA:  Fall at home without memory of what happened. Found by has been. Neck pain and headache. Cervical spine fractures. Evaluate for vertebral artery dissection. EXAM: CT ANGIOGRAPHY NECK TECHNIQUE: Multidetector CT imaging of the neck was performed using the standard protocol during bolus administration of intravenous contrast. Multiplanar CT image reconstructions and  MIPs were obtained to evaluate the vascular anatomy. Carotid stenosis measurements (when applicable) are obtained utilizing NASCET criteria, using the distal internal carotid diameter as the denominator. CONTRAST:  50 mL Isovue 370 COMPARISON:  Cervical spine CT earlier today FINDINGS: Aortic arch: 3 vessel aortic arch with mild atherosclerotic plaque. Brachiocephalic and subclavian arteries are patent. There is focal eccentric calcified plaque in the proximal right subclavian artery without significant stenosis. Right carotid system: Patent without evidence of stenosis, dissection, or aneurysm. Left carotid system: Patent without evidence of stenosis, dissection, or aneurysm. Vertebral arteries: Vertebral arteries are patent without evidence of stenosis, dissection, or aneurysm. Left vertebral artery is dominant. Skeleton: Solid C5-C7 intervertebral osseous fusion with fracture extending through the anterior and middle columns at C6-7 and through the pedicles and facets bilaterally at C6 as described on earlier spine CT. Other neck: Bilateral apical lung scarring. IMPRESSION: 1. No evidence of acute traumatic vertebral artery injury. 2. No significant extracranial arterial stenosis. 3. Cervical spine fractures at C6-7 as previously described. Electronically Signed   By: Logan Bores M.D.   On: 04/09/2016 09:31   Ct Cervical Spine Wo Contrast  04/09/2016  CLINICAL DATA:  [place], patient does not recall event. Headache and vomiting, neck pain. EXAM: CT HEAD WITHOUT CONTRAST CT CERVICAL SPINE WITHOUT CONTRAST TECHNIQUE: Multidetector CT imaging of the head and cervical spine was performed following the standard protocol without intravenous contrast. Multiplanar CT image reconstructions of the cervical spine were also generated. COMPARISON:  MRI of the cervical spine January 20, 2014 and CT HEAD June 20, 2009. FINDINGS: CT HEAD FINDINGS INTRACRANIAL CONTENTS: The ventricles and sulci are normal for age. No  intraparenchymal hemorrhage, mass effect nor midline shift. Patchy supratentorial white matter hypodensities are less than expected for patient's age and though non-specific likely represent chronic small vessel ischemic disease. No acute large vascular territory infarcts. No abnormal extra-axial fluid collections. Basal cisterns are patent. Moderate calcific atherosclerosis of the carotid siphons. ORBITS: The included ocular globes and orbital contents are non-suspicious. SINUSES: Small RIGHT ethmoid mucosal retention cyst. The mastoid aircells and included paranasal sinuses are otherwise well-aerated. SKULL/SOFT TISSUES: No skull fracture. No significant soft tissue swelling. CT CERVICAL SPINE FINDINGS Osseous structures: Acute nondisplaced fracture through C5 bilateral pedicles extending into the facets. New nondisplaced fracture extension through C6-7 vertebra anterior and middle columns, status post C5 through C7 fusion with arthrodesis. The remaining vertebral bodies are intact. Straightened cervical lordosis. No malalignment. Severe at C3-4 and C4-5 disc height loss with uncovertebral hypertrophy compatible with degenerative disc. Multilevel moderate facet arthropathy. No destructive bony lesions. C1-2 articulation maintained with mild arthropathy. No destructive bony lesions. No significant prevertebral or paraspinal soft tissue swelling. Bilateral lung apical fibronodular scarring. IMPRESSION: CT HEAD: Negative CT HEAD for age. CT CERVICAL SPINE: Acute nondisplaced 3 column fracture through C5 through C7 solid fusion  mass without malalignment. As fractures involve the pedicles, vertebral artery injury is possible and could be evaluated by CTA NECK. Acute findings discussed with and reconfirmed by Dr.JOSHUA ZAVITZ on 04/09/2016 at 1:16 am. Electronically Signed   By: Elon Alas M.D.   On: 04/09/2016 01:16    Microbiology: No results found for this or any previous visit (from the past 240 hour(s)).    Labs: Basic Metabolic Panel:  Recent Labs Lab 04/08/16 2300 04/08/16 2316 04/09/16 0605 04/10/16 1352  NA 141 142 140  --   K 3.3* 3.4* 3.8  --   CL 106 103 109  --   CO2 24  --  24  --   GLUCOSE 142* 141* 124*  --   BUN 21* 26* 16  --   CREATININE 0.84 0.80 0.71 0.86  CALCIUM 9.2  --  9.0  --   MG 1.8  --   --   --    Liver Function Tests:  Recent Labs Lab 04/08/16 2300  AST 31  ALT 18  ALKPHOS 65  BILITOT 0.6  PROT 6.5  ALBUMIN 3.7   No results for input(s): LIPASE, AMYLASE in the last 168 hours. No results for input(s): AMMONIA in the last 168 hours. CBC:  Recent Labs Lab 04/08/16 2300 04/08/16 2316 04/09/16 0605 04/10/16 1352  WBC 7.8  --  7.2 5.9  NEUTROABS 5.8  --   --   --   HGB 11.5* 12.2 11.4* 11.3*  HCT 34.1* 36.0 34.5* 34.9*  MCV 90.9  --  90.6 90.2  PLT 238  --  239 205   Cardiac Enzymes: No results for input(s): CKTOTAL, CKMB, CKMBINDEX, TROPONINI in the last 168 hours. BNP: BNP (last 3 results) No results for input(s): BNP in the last 8760 hours.  ProBNP (last 3 results) No results for input(s): PROBNP in the last 8760 hours.  CBG: No results for input(s): GLUCAP in the last 168 hours.

## 2016-04-10 NOTE — Progress Notes (Signed)
  Echocardiogram 2D Echocardiogram has been performed.  Elizabeth Odom 04/10/2016, 11:30 AM

## 2016-04-10 NOTE — Progress Notes (Signed)
PT Cancellation Note  Patient Details Name: MAYIA CLAYMAN MRN: [id number] DOB: 01-29-1941   Cancelled Treatment:    Reason Eval/Treat Not Completed: Patient recently returned from implanting of loop monitor, not appropriate for PT services at this time. PT to continue to follow for evaluation.     Cassell Clement, PT, CSCS Pager [phone] Office [phone]  04/10/2016, 2:24 PM

## 2016-04-16 ENCOUNTER — Encounter: Payer: Self-pay | Admitting: Internal Medicine

## 2016-04-19 ENCOUNTER — Telehealth: Payer: Self-pay | Admitting: *Deleted

## 2016-04-19 NOTE — Telephone Encounter (Signed)
Spoke to patient about tachy episode from 5/5. Patient states that she felt chest pressure and clammy during the time of the episode. Episode was reviewed with Dr.Klein, who believed the episode was VT. He called patient and spoke to her about this episode and the episode leading up to her syncopal event weeks ago. Based on patient's recount of the syncopal event Dr.Klein did not believe that VT was the potential culprit. Dr.Klein will discuss episode with Dr.Taylor and we will get in touch with patient after a plan is developed.

## 2016-04-24 ENCOUNTER — Ambulatory Visit (INDEPENDENT_AMBULATORY_CARE_PROVIDER_SITE_OTHER): Payer: PPO | Admitting: *Deleted

## 2016-04-24 DIAGNOSIS — R55 Syncope and collapse: Secondary | ICD-10-CM

## 2016-04-25 ENCOUNTER — Telehealth: Payer: Self-pay | Admitting: *Deleted

## 2016-04-25 LAB — CUP PACEART INCLINIC DEVICE CHECK: Date Time Interrogation Session: 20170515201155

## 2016-04-25 NOTE — Telephone Encounter (Signed)
New Message   Pt request a call back to discuss the 7 second message from the device. They were advised that the pt would speak to a cardiologist during ov on 04/24/2016 and they only spoke with a nurse. Also, Was advised that she couldn't drive but she could go walking swimming and tanning. This doesn't seem right per pt. Please call back to clarify.

## 2016-04-25 NOTE — Progress Notes (Signed)
Wound check in clinic s/p ILR implant. Wound well healed without redness or edema. Battery status: GOOD.  (1) symptom episode--patient felt "heart racing"--SR w/brief AT; 1 tachy episode--VT; previously reviewed, 0 pause episodes, 0 brady episodes. 0 AF episodes (0% burden). Monthly summary reports and ROV with GT in 3 months.

## 2016-04-26 ENCOUNTER — Telehealth: Payer: Self-pay | Admitting: Internal Medicine

## 2016-04-26 NOTE — Telephone Encounter (Signed)
Dr Lovena Le will call patient and discuss her concerns

## 2016-04-26 NOTE — Telephone Encounter (Signed)
Spoke to Dr.Taylor about patient's concerns. Dr.Taylor stated that patient could not drive x 6 months due to syncope.   Spoke to patient again about her driving restriction. I explained to her that this is not just a recommendation from this office, but that this is a Sports coach. I explained to patient that she is still allowed to swim, tan, and walk bc this is not putting others at risk, but if she had a syncopal spell while driving it would put others at risk.   Patient voiced understanding.

## 2016-04-26 NOTE — Telephone Encounter (Signed)
New problem   Pt wants to speak to Dr Lovena Le for more answers, because the nurse didn't explain everything to her that she need to know. Please call

## 2016-05-10 ENCOUNTER — Ambulatory Visit (INDEPENDENT_AMBULATORY_CARE_PROVIDER_SITE_OTHER): Payer: PPO | Admitting: *Deleted

## 2016-05-10 DIAGNOSIS — R55 Syncope and collapse: Secondary | ICD-10-CM

## 2016-05-11 NOTE — Progress Notes (Signed)
[facility] Summary Report / Loop Recorder 

## 2016-05-17 ENCOUNTER — Encounter: Payer: Self-pay | Admitting: Internal Medicine

## 2016-05-18 DIAGNOSIS — R55 Syncope and collapse: Secondary | ICD-10-CM | POA: Diagnosis not present

## 2016-05-18 DIAGNOSIS — R0602 Shortness of breath: Secondary | ICD-10-CM | POA: Diagnosis not present

## 2016-05-18 DIAGNOSIS — R002 Palpitations: Secondary | ICD-10-CM | POA: Diagnosis not present

## 2016-05-18 DIAGNOSIS — R0789 Other chest pain: Secondary | ICD-10-CM | POA: Diagnosis not present

## 2016-06-09 ENCOUNTER — Encounter: Payer: PPO | Admitting: *Deleted

## 2016-06-19 LAB — CUP PACEART REMOTE DEVICE CHECK: MDC IDC SESS DTM: 20170531160628

## 2016-07-06 ENCOUNTER — Encounter: Payer: Self-pay | Admitting: Internal Medicine

## 2016-07-09 DIAGNOSIS — R55 Syncope and collapse: Secondary | ICD-10-CM | POA: Diagnosis not present

## 2016-07-09 DIAGNOSIS — Z95818 Presence of other cardiac implants and grafts: Secondary | ICD-10-CM | POA: Diagnosis not present

## 2016-07-25 ENCOUNTER — Encounter: Payer: PPO | Admitting: Internal Medicine

## 2016-07-25 DIAGNOSIS — Z95818 Presence of other cardiac implants and grafts: Secondary | ICD-10-CM | POA: Diagnosis not present

## 2016-07-25 DIAGNOSIS — R55 Syncope and collapse: Secondary | ICD-10-CM | POA: Diagnosis not present

## 2016-07-31 DIAGNOSIS — Z780 Asymptomatic menopausal state: Secondary | ICD-10-CM | POA: Diagnosis not present

## 2016-08-09 DIAGNOSIS — Z95818 Presence of other cardiac implants and grafts: Secondary | ICD-10-CM | POA: Diagnosis not present

## 2016-08-09 DIAGNOSIS — R55 Syncope and collapse: Secondary | ICD-10-CM | POA: Diagnosis not present

## 2016-09-01 DIAGNOSIS — Z23 Encounter for immunization: Secondary | ICD-10-CM | POA: Diagnosis not present

## 2016-09-09 DIAGNOSIS — R55 Syncope and collapse: Secondary | ICD-10-CM | POA: Diagnosis not present

## 2016-09-09 DIAGNOSIS — Z95818 Presence of other cardiac implants and grafts: Secondary | ICD-10-CM | POA: Diagnosis not present

## 2016-10-09 DIAGNOSIS — Z95818 Presence of other cardiac implants and grafts: Secondary | ICD-10-CM | POA: Diagnosis not present

## 2016-11-09 DIAGNOSIS — R55 Syncope and collapse: Secondary | ICD-10-CM | POA: Diagnosis not present

## 2016-11-09 DIAGNOSIS — Z95818 Presence of other cardiac implants and grafts: Secondary | ICD-10-CM | POA: Diagnosis not present

## 2016-12-09 DIAGNOSIS — Z95818 Presence of other cardiac implants and grafts: Secondary | ICD-10-CM | POA: Diagnosis not present

## 2016-12-09 DIAGNOSIS — R55 Syncope and collapse: Secondary | ICD-10-CM | POA: Diagnosis not present

## 2017-01-09 DIAGNOSIS — Z95818 Presence of other cardiac implants and grafts: Secondary | ICD-10-CM | POA: Diagnosis not present

## 2017-01-25 DIAGNOSIS — R55 Syncope and collapse: Secondary | ICD-10-CM | POA: Diagnosis not present

## 2017-01-25 DIAGNOSIS — Z95818 Presence of other cardiac implants and grafts: Secondary | ICD-10-CM | POA: Diagnosis not present

## 2017-02-08 DIAGNOSIS — I10 Essential (primary) hypertension: Secondary | ICD-10-CM | POA: Diagnosis not present

## 2017-02-08 DIAGNOSIS — Z95818 Presence of other cardiac implants and grafts: Secondary | ICD-10-CM | POA: Diagnosis not present

## 2017-02-08 DIAGNOSIS — R55 Syncope and collapse: Secondary | ICD-10-CM | POA: Diagnosis not present

## 2017-02-15 DIAGNOSIS — Z6821 Body mass index (BMI) 21.0-21.9, adult: Secondary | ICD-10-CM | POA: Diagnosis not present

## 2017-02-15 DIAGNOSIS — I10 Essential (primary) hypertension: Secondary | ICD-10-CM | POA: Diagnosis not present

## 2017-02-15 DIAGNOSIS — F329 Major depressive disorder, single episode, unspecified: Secondary | ICD-10-CM | POA: Diagnosis not present

## 2017-02-15 DIAGNOSIS — G2581 Restless legs syndrome: Secondary | ICD-10-CM | POA: Diagnosis not present

## 2017-02-15 DIAGNOSIS — Z1389 Encounter for screening for other disorder: Secondary | ICD-10-CM | POA: Diagnosis not present

## 2017-02-15 DIAGNOSIS — M720 Palmar fascial fibromatosis [Dupuytren]: Secondary | ICD-10-CM | POA: Diagnosis not present

## 2017-02-15 DIAGNOSIS — F5104 Psychophysiologic insomnia: Secondary | ICD-10-CM | POA: Diagnosis not present

## 2017-02-15 DIAGNOSIS — R0789 Other chest pain: Secondary | ICD-10-CM | POA: Diagnosis not present

## 2017-02-15 DIAGNOSIS — Z0000 Encounter for general adult medical examination without abnormal findings: Secondary | ICD-10-CM | POA: Diagnosis not present

## 2017-03-11 DIAGNOSIS — R55 Syncope and collapse: Secondary | ICD-10-CM | POA: Diagnosis not present

## 2017-03-11 DIAGNOSIS — Z95818 Presence of other cardiac implants and grafts: Secondary | ICD-10-CM | POA: Diagnosis not present

## 2017-04-10 DIAGNOSIS — R55 Syncope and collapse: Secondary | ICD-10-CM | POA: Diagnosis not present

## 2017-04-10 DIAGNOSIS — Z95818 Presence of other cardiac implants and grafts: Secondary | ICD-10-CM | POA: Diagnosis not present

## 2017-05-11 DIAGNOSIS — R55 Syncope and collapse: Secondary | ICD-10-CM | POA: Diagnosis not present

## 2017-05-11 DIAGNOSIS — Z95818 Presence of other cardiac implants and grafts: Secondary | ICD-10-CM | POA: Diagnosis not present

## 2017-06-09 IMAGING — CT CT CERVICAL SPINE W/O CM
3 of 7 series · 11 of 33 positions shown, 13 images · non-contrast
Comparison: MRI of the cervical spine January 20, 2014 and CT HEAD
June 20, 2009.

CLINICAL DATA: Fell, patient does not recall event. Headache and
vomiting, neck pain.

EXAM:
CT HEAD WITHOUT CONTRAST
CT CERVICAL SPINE WITHOUT CONTRAST
TECHNIQUE: Multidetector CT imaging of the head and cervical spine was
performed following the standard protocol without intravenous
contrast. Multiplanar CT image reconstructions of the cervical spine
were also generated.

[Series 307: coronals · coronal · 0.39mm/px · 3 of 52 slices shown]
[im 12/52  bone]
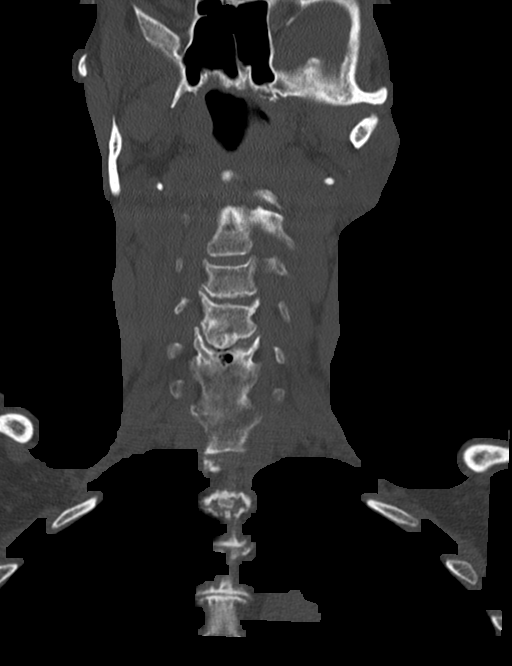
[im 21/52  bone]
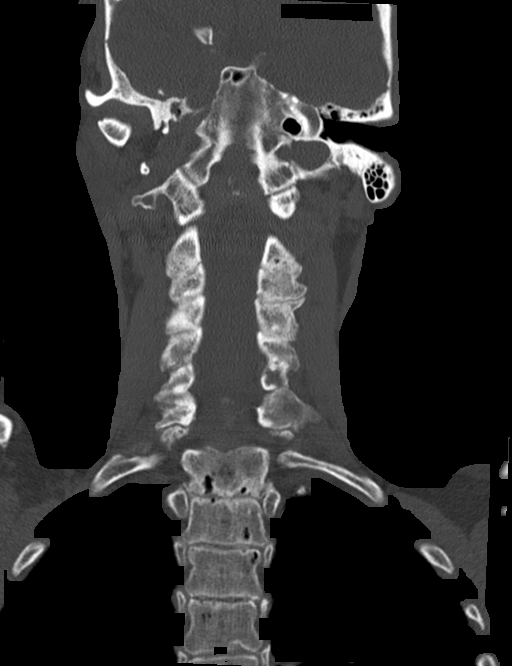
[im 31/52  bone]
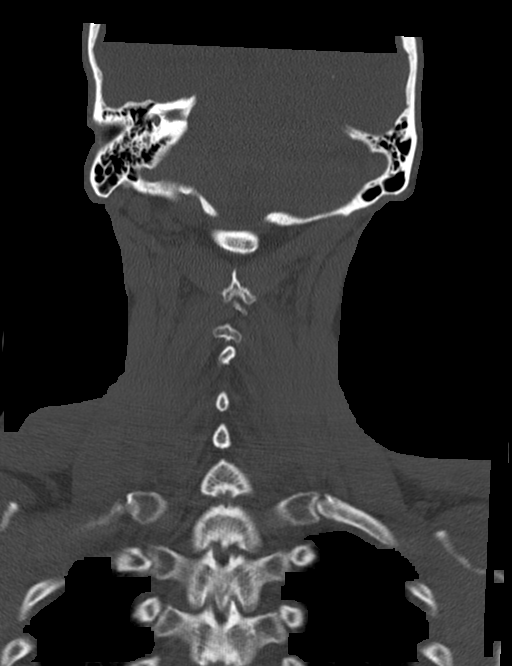

[Series 308: sagittals · sagittal · 0.39mm/px · 5 of 45 slices shown, 6 images]
[im 15/45  bone]
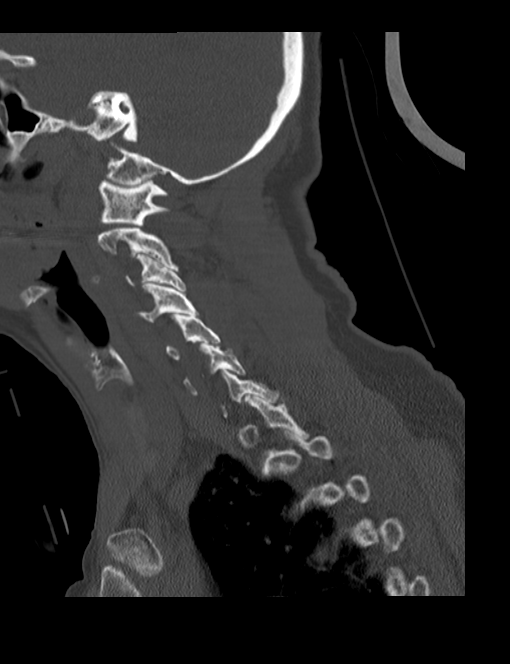
[im 19/45  bone]
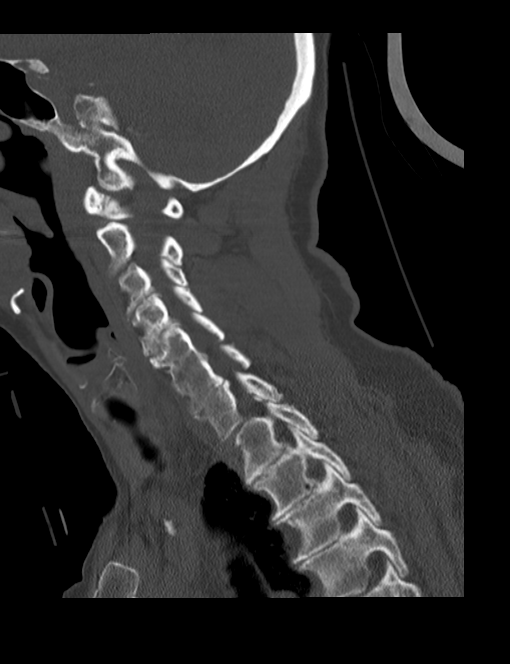
[im 23/45  soft-tissue]
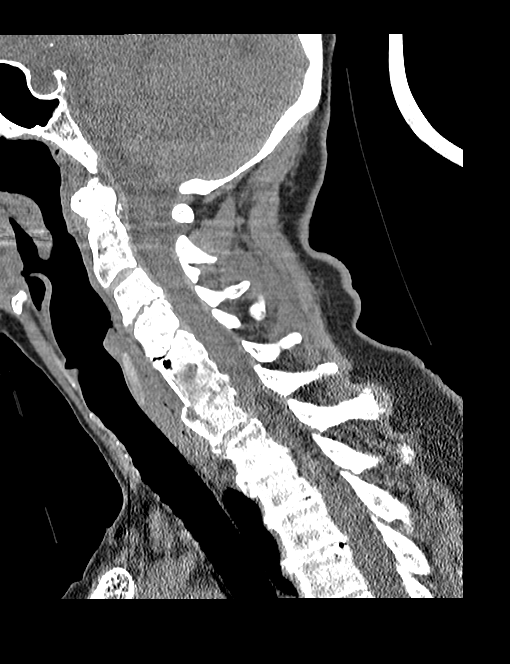
[im 23/45  bone]
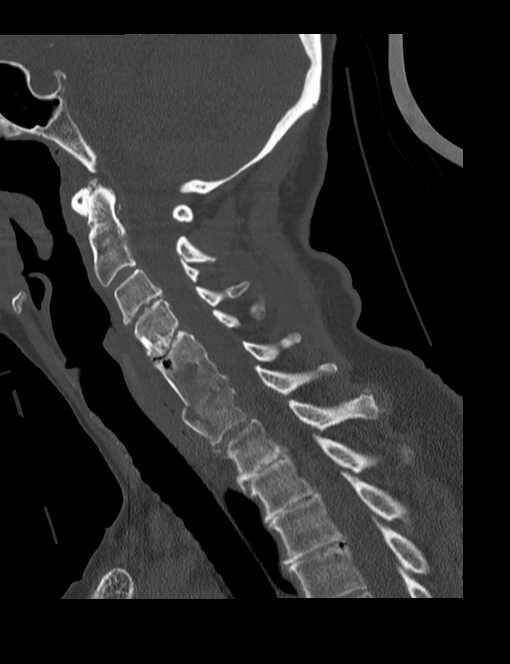
[im 26/45  bone]
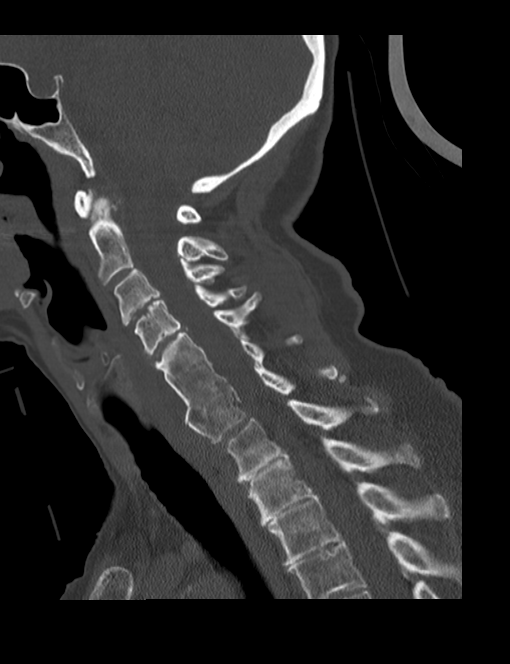
[im 30/45  bone]
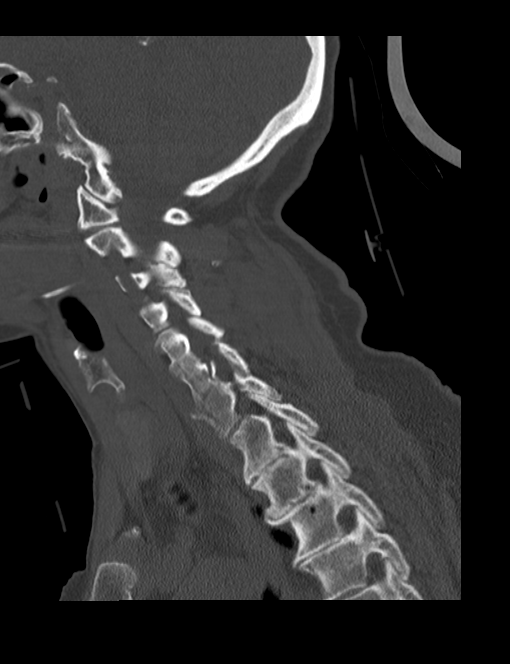

[Series 3010: axial st · axial · 0.40mm/px · z∈[+238,+430]mm · 3 of 97 slices shown, 4 images]
[im 1/97  soft-tissue]
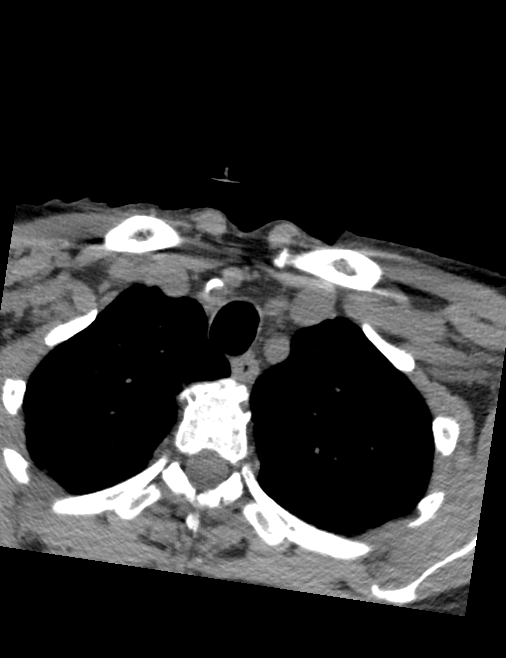
[im 1/97  bone]
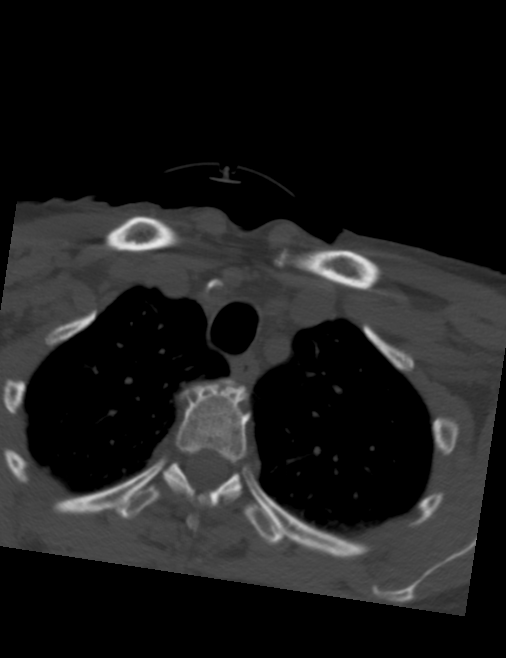
[im 49/97  bone]
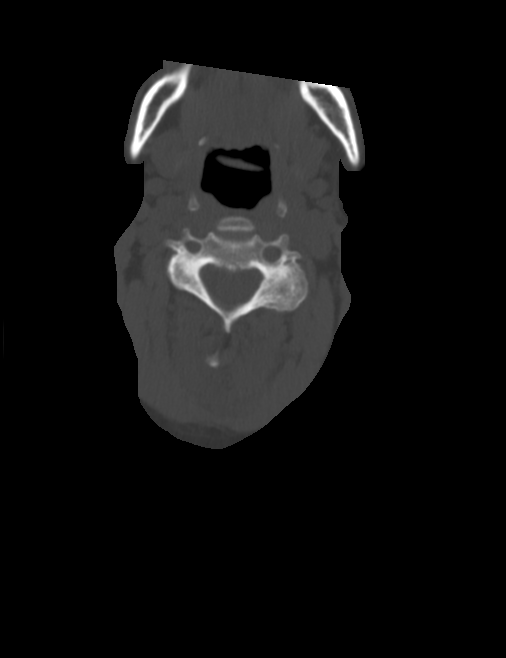
[im 97/97  bone]
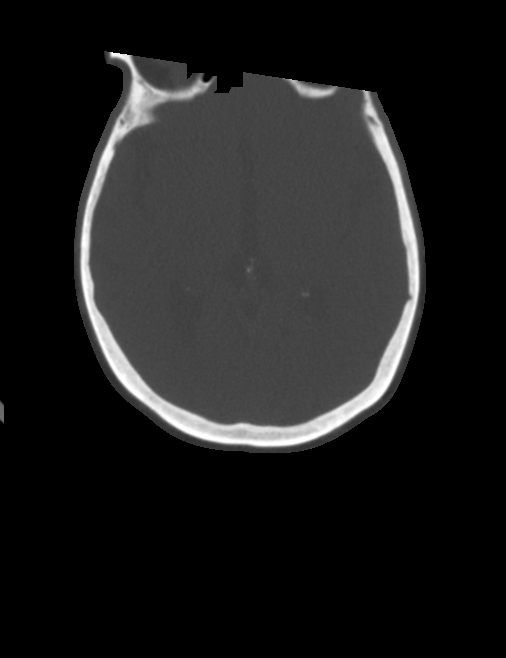

[11 of 33 positions shown; findings below may reference images not displayed]

FINDINGS: CT HEAD FINDINGS

INTRACRANIAL CONTENTS: The ventricles and sulci are normal for age.
No intraparenchymal hemorrhage, mass effect nor midline shift.
Patchy supratentorial white matter hypodensities are less than
expected for patient's age and though non-specific likely represent
chronic small vessel ischemic disease. No acute large vascular
territory infarcts. No abnormal extra-axial fluid collections. Basal
cisterns are patent. Moderate calcific atherosclerosis of the
carotid siphons.

ORBITS: The included ocular globes and orbital contents are
non-suspicious.

SINUSES: Small RIGHT ethmoid mucosal retention cyst. The mastoid
aircells and included paranasal sinuses are otherwise well-aerated.

SKULL/SOFT TISSUES: No skull fracture. No significant soft tissue
swelling.

CT CERVICAL SPINE FINDINGS

Osseous structures: Acute nondisplaced fracture through C5 bilateral
pedicles extending into the facets. New nondisplaced fracture
extension through C6-7 vertebra anterior and middle columns, status
post C5 through C7 fusion with arthrodesis. The remaining vertebral
bodies are intact. Straightened cervical lordosis. No malalignment.
Severe at C3-4 and C4-5 disc height loss with uncovertebral
hypertrophy compatible with degenerative disc. Multilevel moderate
facet arthropathy. No destructive bony lesions. C1-2 articulation
maintained with mild arthropathy. No destructive bony lesions. No
significant prevertebral or paraspinal soft tissue swelling.
Bilateral lung apical fibronodular scarring.
IMPRESSION: CT HEAD: Negative CT HEAD for age.

CT CERVICAL SPINE: Acute nondisplaced 3 column fracture through C5
through C7 solid fusion mass without malalignment. As fractures
involve the pedicles, vertebral artery injury is possible and could
be evaluated by CTA NECK.

Acute findings discussed with and reconfirmed by Dr.KAKUENA BASEKO on
04/09/2016 at [DATE].

## 2017-06-10 DIAGNOSIS — R55 Syncope and collapse: Secondary | ICD-10-CM | POA: Diagnosis not present

## 2017-06-10 DIAGNOSIS — Z95818 Presence of other cardiac implants and grafts: Secondary | ICD-10-CM | POA: Diagnosis not present

## 2017-07-11 DIAGNOSIS — Z95818 Presence of other cardiac implants and grafts: Secondary | ICD-10-CM | POA: Diagnosis not present

## 2017-07-11 DIAGNOSIS — R55 Syncope and collapse: Secondary | ICD-10-CM | POA: Diagnosis not present

## 2017-07-24 DIAGNOSIS — R55 Syncope and collapse: Secondary | ICD-10-CM | POA: Diagnosis not present

## 2017-07-24 DIAGNOSIS — R0789 Other chest pain: Secondary | ICD-10-CM | POA: Diagnosis not present

## 2017-07-24 DIAGNOSIS — Z95818 Presence of other cardiac implants and grafts: Secondary | ICD-10-CM | POA: Diagnosis not present

## 2017-08-11 DIAGNOSIS — R0789 Other chest pain: Secondary | ICD-10-CM | POA: Diagnosis not present

## 2017-08-11 DIAGNOSIS — R55 Syncope and collapse: Secondary | ICD-10-CM | POA: Diagnosis not present

## 2017-08-11 DIAGNOSIS — Z95818 Presence of other cardiac implants and grafts: Secondary | ICD-10-CM | POA: Diagnosis not present

## 2017-09-08 DIAGNOSIS — Z23 Encounter for immunization: Secondary | ICD-10-CM | POA: Diagnosis not present

## 2017-09-10 DIAGNOSIS — Z95818 Presence of other cardiac implants and grafts: Secondary | ICD-10-CM | POA: Diagnosis not present

## 2017-09-10 DIAGNOSIS — R55 Syncope and collapse: Secondary | ICD-10-CM | POA: Diagnosis not present

## 2017-09-10 DIAGNOSIS — R0789 Other chest pain: Secondary | ICD-10-CM | POA: Diagnosis not present

## 2017-10-11 DIAGNOSIS — Z95818 Presence of other cardiac implants and grafts: Secondary | ICD-10-CM | POA: Diagnosis not present

## 2017-10-11 DIAGNOSIS — R55 Syncope and collapse: Secondary | ICD-10-CM | POA: Diagnosis not present

## 2017-10-11 DIAGNOSIS — R079 Chest pain, unspecified: Secondary | ICD-10-CM | POA: Diagnosis not present

## 2017-10-11 DIAGNOSIS — R0789 Other chest pain: Secondary | ICD-10-CM | POA: Diagnosis not present

## 2017-11-10 DIAGNOSIS — Z95818 Presence of other cardiac implants and grafts: Secondary | ICD-10-CM | POA: Diagnosis not present

## 2017-11-10 DIAGNOSIS — R55 Syncope and collapse: Secondary | ICD-10-CM | POA: Diagnosis not present

## 2017-11-10 DIAGNOSIS — R0789 Other chest pain: Secondary | ICD-10-CM | POA: Diagnosis not present

## 2017-11-10 DIAGNOSIS — R079 Chest pain, unspecified: Secondary | ICD-10-CM | POA: Diagnosis not present

## 2017-12-11 DIAGNOSIS — R55 Syncope and collapse: Secondary | ICD-10-CM | POA: Diagnosis not present

## 2017-12-11 DIAGNOSIS — Z95818 Presence of other cardiac implants and grafts: Secondary | ICD-10-CM | POA: Diagnosis not present

## 2017-12-11 DIAGNOSIS — R0789 Other chest pain: Secondary | ICD-10-CM | POA: Diagnosis not present

## 2017-12-11 DIAGNOSIS — R079 Chest pain, unspecified: Secondary | ICD-10-CM | POA: Diagnosis not present

## 2018-01-06 DIAGNOSIS — H524 Presbyopia: Secondary | ICD-10-CM | POA: Diagnosis not present

## 2018-01-11 DIAGNOSIS — R0789 Other chest pain: Secondary | ICD-10-CM | POA: Diagnosis not present

## 2018-01-11 DIAGNOSIS — R55 Syncope and collapse: Secondary | ICD-10-CM | POA: Diagnosis not present

## 2018-01-11 DIAGNOSIS — R079 Chest pain, unspecified: Secondary | ICD-10-CM | POA: Diagnosis not present

## 2018-01-11 DIAGNOSIS — Z95818 Presence of other cardiac implants and grafts: Secondary | ICD-10-CM | POA: Diagnosis not present

## 2018-01-22 DIAGNOSIS — R55 Syncope and collapse: Secondary | ICD-10-CM | POA: Diagnosis not present

## 2018-01-22 DIAGNOSIS — Z95818 Presence of other cardiac implants and grafts: Secondary | ICD-10-CM | POA: Diagnosis not present

## 2018-01-22 DIAGNOSIS — R079 Chest pain, unspecified: Secondary | ICD-10-CM | POA: Diagnosis not present

## 2018-01-22 DIAGNOSIS — R0789 Other chest pain: Secondary | ICD-10-CM | POA: Diagnosis not present

## 2018-02-08 DIAGNOSIS — R55 Syncope and collapse: Secondary | ICD-10-CM | POA: Diagnosis not present

## 2018-02-08 DIAGNOSIS — R079 Chest pain, unspecified: Secondary | ICD-10-CM | POA: Diagnosis not present

## 2018-02-08 DIAGNOSIS — Z95818 Presence of other cardiac implants and grafts: Secondary | ICD-10-CM | POA: Diagnosis not present

## 2018-02-08 DIAGNOSIS — R0789 Other chest pain: Secondary | ICD-10-CM | POA: Diagnosis not present

## 2018-02-11 DIAGNOSIS — R82998 Other abnormal findings in urine: Secondary | ICD-10-CM | POA: Diagnosis not present

## 2018-02-11 DIAGNOSIS — I10 Essential (primary) hypertension: Secondary | ICD-10-CM | POA: Diagnosis not present

## 2018-02-25 DIAGNOSIS — L988 Other specified disorders of the skin and subcutaneous tissue: Secondary | ICD-10-CM | POA: Diagnosis not present

## 2018-02-25 DIAGNOSIS — Z0000 Encounter for general adult medical examination without abnormal findings: Secondary | ICD-10-CM | POA: Diagnosis not present

## 2018-02-25 DIAGNOSIS — M720 Palmar fascial fibromatosis [Dupuytren]: Secondary | ICD-10-CM | POA: Diagnosis not present

## 2018-02-25 DIAGNOSIS — Z1389 Encounter for screening for other disorder: Secondary | ICD-10-CM | POA: Diagnosis not present

## 2018-02-25 DIAGNOSIS — I10 Essential (primary) hypertension: Secondary | ICD-10-CM | POA: Diagnosis not present

## 2018-02-25 DIAGNOSIS — Z6823 Body mass index (BMI) 23.0-23.9, adult: Secondary | ICD-10-CM | POA: Diagnosis not present

## 2018-03-11 DIAGNOSIS — R079 Chest pain, unspecified: Secondary | ICD-10-CM | POA: Diagnosis not present

## 2018-03-11 DIAGNOSIS — R55 Syncope and collapse: Secondary | ICD-10-CM | POA: Diagnosis not present

## 2018-03-11 DIAGNOSIS — R0789 Other chest pain: Secondary | ICD-10-CM | POA: Diagnosis not present

## 2018-03-11 DIAGNOSIS — Z95818 Presence of other cardiac implants and grafts: Secondary | ICD-10-CM | POA: Diagnosis not present

## 2018-05-02 ENCOUNTER — Telehealth: Payer: Self-pay | Admitting: Internal Medicine

## 2018-05-02 NOTE — Telephone Encounter (Signed)
Call made to [facility] with Dr. Wynonia Lawman.  Per Brandy Pt saw Dr. Wynonia Lawman in February of this year and complained about loop.  Dr. Thurman Coyer note at that time states he would only consider loop removal if Pt pain level increased.  Pt called Dr. Wynonia Lawman recently and c/o of increased pain wanting loop removal.  However, Pt also had questions about being able to be in a pool.   Outreach made to Pt, spoke with husband.  Pt not home at this time, will call tomorrow am.

## 2018-05-02 NOTE — Telephone Encounter (Signed)
New message   Theadora Rama call for  Dr Wynonia Lawman , calling to request loop recorder removal due to pain.  [phone]

## 2018-05-02 NOTE — Telephone Encounter (Signed)
Informed patient she would not be able to go in a pool/bath until her wound check 2 weeks after explant, per Myrtie Hawk RN. Pt states that "if the pain gets any worse" she will "call the office and schedule it to be taken out". She appreciates Sonia Baller calling her about this.

## 2018-06-04 ENCOUNTER — Telehealth: Payer: Self-pay | Admitting: Internal Medicine

## 2018-06-04 NOTE — Telephone Encounter (Signed)
Patient inquiring about having her loop recorder removed.  Please contact to further discuss, thank you.

## 2018-06-04 NOTE — Telephone Encounter (Signed)
New message    Patient calling to discuss loop removal

## 2018-06-06 NOTE — Telephone Encounter (Signed)
Returned call to Pt.  Pt scheduled for loop removal on July 08, 2018 at 11:30 with Dr. Lovena Le.  Directions given.  Will send to precert/scheduling.

## 2018-07-08 ENCOUNTER — Ambulatory Visit (HOSPITAL_COMMUNITY)
Admission: RE | Admit: 2018-07-08 | Discharge: 2018-07-08 | Disposition: A | Discharge status: Home / Self Care | Payer: PPO | Source: Ambulatory Visit | Attending: Internal Medicine | Admitting: Internal Medicine

## 2018-07-08 ENCOUNTER — Encounter (HOSPITAL_COMMUNITY): Payer: Self-pay | Admitting: Internal Medicine

## 2018-07-08 ENCOUNTER — Encounter (HOSPITAL_COMMUNITY)
Admission: RE | Disposition: A | Discharge status: Home / Self Care | Payer: Self-pay | Source: Ambulatory Visit | Attending: Internal Medicine

## 2018-07-08 DIAGNOSIS — Z4509 Encounter for adjustment and management of other cardiac device: Secondary | ICD-10-CM | POA: Insufficient documentation

## 2018-07-08 DIAGNOSIS — R55 Syncope and collapse: Secondary | ICD-10-CM | POA: Diagnosis not present

## 2018-07-08 DIAGNOSIS — Z882 Allergy status to sulfonamides status: Secondary | ICD-10-CM | POA: Insufficient documentation

## 2018-07-08 DIAGNOSIS — G2581 Restless legs syndrome: Secondary | ICD-10-CM | POA: Insufficient documentation

## 2018-07-08 HISTORY — PX: LOOP RECORDER REMOVAL: EP1215

## 2018-07-08 SURGERY — LOOP RECORDER REMOVAL

## 2018-07-08 MED ORDER — LIDOCAINE-EPINEPHRINE 1 %-1:100000 IJ SOLN
INTRAMUSCULAR | Status: DC | PRN
  Administered 2018-07-08: 10 mL

## 2018-07-08 MED ORDER — LIDOCAINE-EPINEPHRINE 1 %-1:100000 IJ SOLN
INTRAMUSCULAR | Status: AC
  Filled 2018-07-08: qty 1

## 2018-07-08 SURGICAL SUPPLY — 1 items: PACK LOOP INSERTION (CUSTOM PROCEDURE TRAY) ×3 IMPLANT

## 2018-07-08 NOTE — Interval H&P Note (Signed)
History and Physical Interval Note:  07/08/2018 11:52 AM  Elizabeth Odom  has presented today for surgery, with the diagnosis of syncope  The various methods of treatment have been discussed with the patient and family. After consideration of risks, benefits and other options for treatment, the patient has consented to  Procedure(s): LOOP RECORDER REMOVAL (N/A) as a surgical intervention .  The patient's history has been reviewed, patient examined, no change in status, stable for surgery.  I have reviewed the patient's chart and labs.  Questions were answered to the patient's satisfaction.     [name]

## 2018-07-08 NOTE — Discharge Instructions (Signed)
See extra paper for dc loop recorder inserts

## 2018-07-08 NOTE — H&P (Addendum)
  Elizabeth Odom is an 77 y.o. female.   Chief Complaint: "I want my loop recorder removed" HPI: The patient is a pleasant 77 yo woman with a h/o unexplained syncope who underwent ILR insertion 2 years ago. She has not had any pauses and presents today to have her ILR removed.   Past Medical History:  Diagnosis Date  . Restless leg     Past Surgical History:  Procedure Laterality Date  . CERVICAL LAMINECTOMY    . EP IMPLANTABLE DEVICE N/A 04/10/2016   Procedure: Loop Recorder Insertion;  Surgeon: Evans Lance, MD;  Location: [facility];  Service: Cardiovascular;  Laterality: N/A;  . HARVEST BONE GRAFT      Family History  Problem Relation Age of Onset  . Cancer Sister   . Diabetes Sister   . Stroke Daughter   . Thyroid disease Daughter    Social History:  reports that she has never smoked. She has never used smokeless tobacco. She reports that she does not drink alcohol. Her drug history is not on file.  Allergies:  Allergies  Allergen Reactions  . Sulfur Anaphylaxis    No medications prior to admission.    No results found for this or any previous visit (from the past 48 hour(s)). No results found.  ROS  Blood pressure (!) 172/83, pulse 67, temperature 98.5 F (36.9 C), temperature source Oral, height 5\' 7"  (1.702 m), weight 132 lb (59.9 kg). Physical Exam  Well appearing 77 yo woman, NAD HEENT: Unremarkable,, AT Neck:  6 JVD, no thyromegally Back:  No CVA tenderness Lungs:  Clear with no wheezes, rales, or rhonchi, ILR is tender to palpation. HEART:  Regular rate rhythm, no murmurs, no rubs, no clicks Abd:  soft, positive bowel sounds, no organomegally, no rebound, no guarding Ext:  2 plus pulses, no edema, no cyanosis, no clubbing Skin:  No rashes no nodules Neuro:  CN II through XII intact, motor grossly intact   Assessment/Plan 1. Unexplained syncope - she is stable and would like to have her ILR removed. I have discussed the  indications/risks/benefits/goals/expectations of the procedure and she wishes to proceed.  [name], M.D. 07/08/2018, 11:46 AM

## 2018-07-15 DIAGNOSIS — Z95818 Presence of other cardiac implants and grafts: Secondary | ICD-10-CM | POA: Diagnosis not present

## 2018-07-15 DIAGNOSIS — R079 Chest pain, unspecified: Secondary | ICD-10-CM | POA: Diagnosis not present

## 2018-07-15 DIAGNOSIS — R55 Syncope and collapse: Secondary | ICD-10-CM | POA: Diagnosis not present

## 2018-07-15 DIAGNOSIS — R0789 Other chest pain: Secondary | ICD-10-CM | POA: Diagnosis not present

## 2018-07-17 ENCOUNTER — Ambulatory Visit (INDEPENDENT_AMBULATORY_CARE_PROVIDER_SITE_OTHER): Payer: PPO | Admitting: *Deleted

## 2018-07-17 DIAGNOSIS — R55 Syncope and collapse: Secondary | ICD-10-CM

## 2018-07-17 NOTE — Progress Notes (Signed)
Wound check in clinic, s/p ILR explant on 07/08/18.  Steri-strips removed by patient prior to appointment.  Wound without redness or edema.  Incision edges approximated and well healed.  ROV with GT PRN.

## 2018-09-07 DIAGNOSIS — Z23 Encounter for immunization: Secondary | ICD-10-CM | POA: Diagnosis not present

## 2019-02-24 DIAGNOSIS — R82998 Other abnormal findings in urine: Secondary | ICD-10-CM | POA: Diagnosis not present

## 2019-02-24 DIAGNOSIS — I10 Essential (primary) hypertension: Secondary | ICD-10-CM | POA: Diagnosis not present

## 2019-04-14 DIAGNOSIS — Z1339 Encounter for screening examination for other mental health and behavioral disorders: Secondary | ICD-10-CM | POA: Diagnosis not present

## 2019-04-14 DIAGNOSIS — M720 Palmar fascial fibromatosis [Dupuytren]: Secondary | ICD-10-CM | POA: Diagnosis not present

## 2019-04-14 DIAGNOSIS — I10 Essential (primary) hypertension: Secondary | ICD-10-CM | POA: Diagnosis not present

## 2019-04-14 DIAGNOSIS — G2581 Restless legs syndrome: Secondary | ICD-10-CM | POA: Diagnosis not present

## 2019-04-14 DIAGNOSIS — Z1331 Encounter for screening for depression: Secondary | ICD-10-CM | POA: Diagnosis not present

## 2019-04-14 DIAGNOSIS — Z0000 Encounter for general adult medical examination without abnormal findings: Secondary | ICD-10-CM | POA: Diagnosis not present

## 2019-08-23 DIAGNOSIS — Z23 Encounter for immunization: Secondary | ICD-10-CM | POA: Diagnosis not present

## 2019-08-25 DIAGNOSIS — I10 Essential (primary) hypertension: Secondary | ICD-10-CM | POA: Diagnosis not present

## 2019-08-25 DIAGNOSIS — R634 Abnormal weight loss: Secondary | ICD-10-CM | POA: Diagnosis not present

## 2019-12-25 DIAGNOSIS — R634 Abnormal weight loss: Secondary | ICD-10-CM | POA: Diagnosis not present

## 2019-12-25 DIAGNOSIS — F329 Major depressive disorder, single episode, unspecified: Secondary | ICD-10-CM | POA: Diagnosis not present

## 2019-12-25 DIAGNOSIS — Z1331 Encounter for screening for depression: Secondary | ICD-10-CM | POA: Diagnosis not present

## 2019-12-25 DIAGNOSIS — I10 Essential (primary) hypertension: Secondary | ICD-10-CM | POA: Diagnosis not present

## 2019-12-29 DIAGNOSIS — H5201 Hypermetropia, right eye: Secondary | ICD-10-CM | POA: Diagnosis not present

## 2019-12-29 DIAGNOSIS — H04123 Dry eye syndrome of bilateral lacrimal glands: Secondary | ICD-10-CM | POA: Diagnosis not present

## 2019-12-29 DIAGNOSIS — H524 Presbyopia: Secondary | ICD-10-CM | POA: Diagnosis not present

## 2019-12-29 DIAGNOSIS — H25813 Combined forms of age-related cataract, bilateral: Secondary | ICD-10-CM | POA: Diagnosis not present

## 2020-04-09 DIAGNOSIS — I10 Essential (primary) hypertension: Secondary | ICD-10-CM | POA: Diagnosis not present

## 2020-04-16 DIAGNOSIS — R0781 Pleurodynia: Secondary | ICD-10-CM | POA: Diagnosis not present

## 2020-04-16 DIAGNOSIS — M720 Palmar fascial fibromatosis [Dupuytren]: Secondary | ICD-10-CM | POA: Diagnosis not present

## 2020-04-16 DIAGNOSIS — Z0000 Encounter for general adult medical examination without abnormal findings: Secondary | ICD-10-CM | POA: Diagnosis not present

## 2020-04-16 DIAGNOSIS — R82998 Other abnormal findings in urine: Secondary | ICD-10-CM | POA: Diagnosis not present

## 2020-04-16 DIAGNOSIS — I10 Essential (primary) hypertension: Secondary | ICD-10-CM | POA: Diagnosis not present

## 2020-04-16 DIAGNOSIS — L989 Disorder of the skin and subcutaneous tissue, unspecified: Secondary | ICD-10-CM | POA: Diagnosis not present

## 2020-04-16 DIAGNOSIS — G2581 Restless legs syndrome: Secondary | ICD-10-CM | POA: Diagnosis not present

## 2020-08-15 DIAGNOSIS — L0390 Cellulitis, unspecified: Secondary | ICD-10-CM | POA: Diagnosis not present

## 2020-08-15 DIAGNOSIS — W010XXA Fall on same level from slipping, tripping and stumbling without subsequent striking against object, initial encounter: Secondary | ICD-10-CM | POA: Diagnosis not present

## 2020-08-15 DIAGNOSIS — S81802A Unspecified open wound, left lower leg, initial encounter: Secondary | ICD-10-CM | POA: Diagnosis not present

## 2020-08-15 DIAGNOSIS — Y92512 Supermarket, store or market as the place of occurrence of the external cause: Secondary | ICD-10-CM | POA: Diagnosis not present

## 2020-08-17 DIAGNOSIS — S81802A Unspecified open wound, left lower leg, initial encounter: Secondary | ICD-10-CM | POA: Diagnosis not present

## 2020-08-24 DIAGNOSIS — S81802D Unspecified open wound, left lower leg, subsequent encounter: Secondary | ICD-10-CM | POA: Diagnosis not present

## 2020-08-24 DIAGNOSIS — Z09 Encounter for follow-up examination after completed treatment for conditions other than malignant neoplasm: Secondary | ICD-10-CM | POA: Diagnosis not present

## 2020-09-08 DIAGNOSIS — Z20828 Contact with and (suspected) exposure to other viral communicable diseases: Secondary | ICD-10-CM | POA: Diagnosis not present

## 2020-09-08 DIAGNOSIS — R110 Nausea: Secondary | ICD-10-CM | POA: Diagnosis not present

## 2020-09-08 DIAGNOSIS — R42 Dizziness and giddiness: Secondary | ICD-10-CM | POA: Diagnosis not present

## 2020-09-08 DIAGNOSIS — R231 Pallor: Secondary | ICD-10-CM | POA: Diagnosis not present

## 2020-09-10 ENCOUNTER — Ambulatory Visit
Admission: RE | Admit: 2020-09-10 | Discharge: 2020-09-10 | Disposition: A | Discharge status: Home / Self Care | Payer: PPO | Source: Ambulatory Visit | Attending: Family Medicine | Admitting: Family Medicine

## 2020-09-10 ENCOUNTER — Other Ambulatory Visit: Payer: Self-pay | Admitting: Family Medicine

## 2020-09-10 DIAGNOSIS — R410 Disorientation, unspecified: Secondary | ICD-10-CM | POA: Diagnosis not present

## 2020-09-10 DIAGNOSIS — R42 Dizziness and giddiness: Secondary | ICD-10-CM | POA: Diagnosis not present

## 2020-09-10 DIAGNOSIS — G2581 Restless legs syndrome: Secondary | ICD-10-CM | POA: Diagnosis not present

## 2020-09-10 DIAGNOSIS — R4189 Other symptoms and signs involving cognitive functions and awareness: Secondary | ICD-10-CM

## 2020-09-10 DIAGNOSIS — R238 Other skin changes: Secondary | ICD-10-CM | POA: Diagnosis not present

## 2020-09-10 DIAGNOSIS — J0120 Acute ethmoidal sinusitis, unspecified: Secondary | ICD-10-CM | POA: Diagnosis not present

## 2020-09-10 DIAGNOSIS — J322 Chronic ethmoidal sinusitis: Secondary | ICD-10-CM | POA: Diagnosis not present

## 2020-09-10 DIAGNOSIS — I10 Essential (primary) hypertension: Secondary | ICD-10-CM | POA: Diagnosis not present

## 2020-09-10 DIAGNOSIS — I491 Atrial premature depolarization: Secondary | ICD-10-CM | POA: Diagnosis not present

## 2020-09-10 DIAGNOSIS — G319 Degenerative disease of nervous system, unspecified: Secondary | ICD-10-CM | POA: Diagnosis not present

## 2020-09-10 DIAGNOSIS — R634 Abnormal weight loss: Secondary | ICD-10-CM | POA: Diagnosis not present

## 2020-09-10 DIAGNOSIS — Z862 Personal history of diseases of the blood and blood-forming organs and certain disorders involving the immune mechanism: Secondary | ICD-10-CM | POA: Diagnosis not present

## 2020-09-27 DIAGNOSIS — G2581 Restless legs syndrome: Secondary | ICD-10-CM | POA: Insufficient documentation

## 2020-09-28 ENCOUNTER — Other Ambulatory Visit: Payer: Self-pay

## 2020-09-28 ENCOUNTER — Encounter: Payer: Self-pay | Admitting: Cardiology

## 2020-09-28 ENCOUNTER — Ambulatory Visit (INDEPENDENT_AMBULATORY_CARE_PROVIDER_SITE_OTHER): Payer: PPO | Admitting: Cardiology

## 2020-09-28 VITALS — BP 126/58 | HR 76 | Ht 67.0 in | Wt 123.0 lb

## 2020-09-28 DIAGNOSIS — I491 Atrial premature depolarization: Secondary | ICD-10-CM

## 2020-09-28 DIAGNOSIS — R55 Syncope and collapse: Secondary | ICD-10-CM | POA: Diagnosis not present

## 2020-09-28 NOTE — Progress Notes (Signed)
Cardiology Office Note:    Date:  09/28/2020   ID:  LACHE DAGHER, DOB 08/16/1941, MRN [id number]  PCP:  [facility] [place]  Cardiologist:  Shirlee More, MD    Referring MD: Leanna Battles, MD    ASSESSMENT:    1. APC (atrial premature contractions)   2. Syncope and collapse    PLAN:    In order of problems listed above:  1. Asymptomatic at this time I would repeat monitoring and asked her to avoid over-the-counter proarrhythmic drugs if symptomatic ambulatory event monitor would be appropriate. 2. Stable no recurrence she had her loop recorder explanted 2019   Next appointment: As needed   Medication Adjustments/Labs and Tests Ordered: Current medicines are reviewed at length with the patient today.  Concerns regarding medicines are outlined above.  Orders Placed This Encounter  Procedures  . EKG 12-Lead   No orders of the defined types were placed in this encounter.   No chief complaint on file.   History of Present Illness:    LURENE ROBLEY is a 79 y.o. female with a hx of syncope she had an implanted loop recorder which did not demonstrate any arrhythmia and her request had removal of the device after being seen by Dr. Lovena Le July 2019.  She had been followed in Dr. Yevette Edwards practice.  Her record 04/10/2016 reviewed she had syncope APCs and cervical spine disc disease with previous surgery there is a notation she had no orthostatic shift in blood pressure no arrhythmia associated the episode echocardiogram was interpreted as normal and at that point time showed an implanted loop recorder for evaluation.  Compliance with diet, lifestyle and medications: Yes  Recently was not well had a course of antibiotic and had an EKG done with frequent and repetitive APCs.  Fortunately she is asymptomatic.  No syncope no chest pain shortness of breath.  She previously had an implant loop recorder we discussed an event monitor she prefers not to and I  think it is a reasonable approach as long as she is asymptomatic.  She will avoid over-the-counter proarrhythmic drugs.  I am awaiting labs drawn the day of her office visit at [place].  Recent labs 09/10/2020: A1c normal 5.8% CMP normal except for random glucose 111 potassium is 3.9 normal liver function CBC normal hemoglobin 15.3 TSH normal 2.33. Past Medical History:  Diagnosis Date  . A-fib ([place]) 04/09/2016  . Acute head injury 04/09/2016  . Cervical spine fracture, initial encounter 04/09/2016  . Multiple fractures of cervical spine ([place]) 04/09/2016  . Restless leg   . Syncope and collapse 04/09/2016    Past Surgical History:  Procedure Laterality Date  . CERVICAL LAMINECTOMY    . EP IMPLANTABLE DEVICE N/A 04/10/2016   Procedure: Loop Recorder Insertion;  Surgeon: Evans Lance, MD;  Location: [facility];  Service: Cardiovascular;  Laterality: N/A;  . HARVEST BONE GRAFT    . LOOP RECORDER REMOVAL N/A 07/08/2018   Procedure: LOOP RECORDER REMOVAL;  Surgeon: Evans Lance, MD;  Location: [facility];  Service: Cardiovascular;  Laterality: N/A;    Current Medications: Current Meds  Medication Sig  . Omega-3 Fatty Acids (FISH OIL) 500 MG CAPS Take 1 capsule by mouth daily.     Allergies:   Sulfur   Social History   Socioeconomic History  . Marital status: Married    Spouse name: Not on file  . Number of children: Not on file  .  Years of education: Not on file  . Highest education level: Not on file  Occupational History  . Not on file  Tobacco Use  . Smoking status: Never Smoker  . Smokeless tobacco: Never Used  Substance and Sexual Activity  . Alcohol use: No    Alcohol/week: 0.0 standard drinks  . Drug use: Not on file  . Sexual activity: Not on file  Other Topics Concern  . Not on file  Social History Narrative  . Not on file   Social Determinants of Health   Financial Resource Strain:   . Difficulty of Paying Living Expenses: Not on  file  Food Insecurity:   . Worried About Charity fundraiser in the Last Year: Not on file  . Ran Out of Food in the Last Year: Not on file  Transportation Needs:   . Lack of Transportation (Medical): Not on file  . Lack of Transportation (Non-Medical): Not on file  Physical Activity:   . Days of Exercise per Week: Not on file  . Minutes of Exercise per Session: Not on file  Stress:   . Feeling of Stress : Not on file  Social Connections:   . Frequency of Communication with Friends and Family: Not on file  . Frequency of Social Gatherings with Friends and Family: Not on file  . Attends Religious Services: Not on file  . Active Member of Clubs or Organizations: Not on file  . Attends Archivist Meetings: Not on file  . Marital Status: Not on file     Family History: The patient's family history includes Cancer in her sister; Diabetes in her sister; Stroke in her daughter; Thyroid disease in her daughter. ROS:   Please see the history of present illness.    All other systems reviewed and are negative.  EKGs/Labs/Other Studies Reviewed:    The following studies were reviewed today:  EKG:  EKG ordered today and personally reviewed.  The ekg ordered today demonstrates sinus rhythm normal today  Recent Labs: No results found for requested labs within last 8760 hours.  Recent Lipid Panel No results found for: CHOL, TRIG, HDL, CHOLHDL, VLDL, LDLCALC, LDLDIRECT  Physical Exam:    VS:  BP (!) 126/58   Pulse 76   Ht 5\' 7"  (1.702 m)   Wt 123 lb 0.6 oz (55.8 kg)   SpO2 96%   BMI 19.27 kg/m     Wt Readings from Last 3 Encounters:  09/28/20 123 lb 0.6 oz (55.8 kg)  07/08/18 132 lb (59.9 kg)  04/09/16 137 lb 6.4 oz (62.3 kg)     GEN:  Well nourished, well developed in no acute distress HEENT: Normal NECK: No JVD; No carotid bruits LYMPHATICS: No lymphadenopathy CARDIAC: RRR, no murmurs, rubs, gallops RESPIRATORY:  Clear to auscultation without rales, wheezing or  rhonchi  ABDOMEN: Soft, non-tender, non-distended MUSCULOSKELETAL:  No edema; No deformity  SKIN: Warm and dry NEUROLOGIC:  Alert and oriented x 3 PSYCHIATRIC:  Normal affect    Signed, Shirlee More, MD  09/28/2020 3:46 PM    [facility]

## 2020-09-28 NOTE — Patient Instructions (Addendum)
Medication Instructions:  Your physician recommends that you continue on your current medications as directed. Please refer to the Current Medication list given to you today.  *If you need a refill on your cardiac medications before your next appointment, please call your pharmacy*   Lab Work: None If you have labs (blood work) drawn today and your tests are completely normal, you will receive your results only by: . MyChart Message (if you have MyChart) OR . A paper copy in the mail If you have any lab test that is abnormal or we need to change your treatment, we will call you to review the results.   Testing/Procedures: None   Follow-Up: At [facility], you and your health needs are our priority.  As part of our continuing mission to provide you with exceptional heart care, we have created designated Provider Care Teams.  These Care Teams include your primary Cardiologist (physician) and Advanced Practice Providers (APPs -  Physician Assistants and Nurse Practitioners) who all work together to provide you with the care you need, when you need it.  We recommend signing up for the patient portal called "MyChart".  Sign up information is provided on this After Visit Summary.  MyChart is used to connect with patients for Virtual Visits (Telemedicine).  Patients are able to view lab/test results, encounter notes, upcoming appointments, etc.  Non-urgent messages can be sent to your provider as well.   To learn more about what you can do with MyChart, go to [email or web address]://[email or web address].com.    Your next appointment:   As needed  The format for your next appointment:   In Person  Provider:   Brian Munley, MD   Other Instructions 1. Avoid all over-the-counter antihistamines except Claritin/Loratadine and Zyrtec/Cetrizine. 2. Avoid all combination including cold sinus allergies flu decongestant and sleep medications 3. You can use Robitussin DM Mucinex and Mucinex DM for cough. 4. can use  Tylenol aspirin ibuprofen and naproxen but no combinations such as sleep or sinus. 
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
## 2020-11-23 ENCOUNTER — Ambulatory Visit: Payer: PPO | Admitting: Neurology

## 2020-11-23 ENCOUNTER — Encounter: Payer: Self-pay | Admitting: Neurology

## 2020-11-23 VITALS — BP 157/72 | HR 65 | Ht 67.0 in | Wt 122.0 lb

## 2020-11-23 DIAGNOSIS — R634 Abnormal weight loss: Secondary | ICD-10-CM | POA: Diagnosis not present

## 2020-11-23 DIAGNOSIS — R4189 Other symptoms and signs involving cognitive functions and awareness: Secondary | ICD-10-CM | POA: Diagnosis not present

## 2020-11-23 DIAGNOSIS — G3184 Mild cognitive impairment, so stated: Secondary | ICD-10-CM | POA: Diagnosis not present

## 2020-11-26 ENCOUNTER — Encounter: Payer: Self-pay | Admitting: Psychology

## 2020-11-27 LAB — HOMOCYSTEINE: Homocysteine: 9.2 umol/L (ref 0.0–19.2)

## 2020-11-27 LAB — METHYLMALONIC ACID, SERUM: Methylmalonic Acid: 384 nmol/L — ABNORMAL HIGH (ref 0–378)

## 2020-11-27 LAB — VITAMIN D 25 HYDROXY (VIT D DEFICIENCY, FRACTURES): Vit D, 25-Hydroxy: 30.1 ng/mL (ref 30.0–100.0)

## 2020-11-27 LAB — VITAMIN B1: Thiamine: 74.8 nmol/L (ref 66.5–200.0)

## 2020-11-27 LAB — B12 AND FOLATE PANEL
Folate: >20 ng/mL (ref >3.0)
Vitamin B-12: >2000 pg/mL — ABNORMAL HIGH (ref 232–1245)

## 2021-03-31 ENCOUNTER — Ambulatory Visit: Payer: PPO | Admitting: Psychology

## 2021-04-26 DIAGNOSIS — Z0000 Encounter for general adult medical examination without abnormal findings: Secondary | ICD-10-CM | POA: Diagnosis not present

## 2021-04-26 DIAGNOSIS — R7303 Prediabetes: Secondary | ICD-10-CM | POA: Diagnosis not present

## 2021-04-26 DIAGNOSIS — E782 Mixed hyperlipidemia: Secondary | ICD-10-CM | POA: Diagnosis not present

## 2021-08-30 DIAGNOSIS — Z23 Encounter for immunization: Secondary | ICD-10-CM | POA: Diagnosis not present

## 2021-10-20 DIAGNOSIS — L299 Pruritus, unspecified: Secondary | ICD-10-CM | POA: Diagnosis not present

## 2022-04-27 DIAGNOSIS — R7303 Prediabetes: Secondary | ICD-10-CM | POA: Diagnosis not present

## 2022-04-27 DIAGNOSIS — Z0000 Encounter for general adult medical examination without abnormal findings: Secondary | ICD-10-CM | POA: Diagnosis not present

## 2022-04-27 DIAGNOSIS — I491 Atrial premature depolarization: Secondary | ICD-10-CM | POA: Diagnosis not present

## 2022-04-27 DIAGNOSIS — E782 Mixed hyperlipidemia: Secondary | ICD-10-CM | POA: Diagnosis not present

## 2022-04-27 DIAGNOSIS — I10 Essential (primary) hypertension: Secondary | ICD-10-CM | POA: Diagnosis not present

## 2022-07-21 DIAGNOSIS — R112 Nausea with vomiting, unspecified: Secondary | ICD-10-CM | POA: Diagnosis not present

## 2022-07-21 DIAGNOSIS — R1084 Generalized abdominal pain: Secondary | ICD-10-CM | POA: Diagnosis not present

## 2022-07-21 DIAGNOSIS — K2900 Acute gastritis without bleeding: Secondary | ICD-10-CM | POA: Diagnosis not present

## 2022-08-22 DIAGNOSIS — Z23 Encounter for immunization: Secondary | ICD-10-CM | POA: Diagnosis not present

## 2023-05-03 DIAGNOSIS — G2581 Restless legs syndrome: Secondary | ICD-10-CM | POA: Diagnosis not present

## 2023-05-03 DIAGNOSIS — E782 Mixed hyperlipidemia: Secondary | ICD-10-CM | POA: Diagnosis not present

## 2023-05-03 DIAGNOSIS — I491 Atrial premature depolarization: Secondary | ICD-10-CM | POA: Diagnosis not present

## 2023-05-03 DIAGNOSIS — R7303 Prediabetes: Secondary | ICD-10-CM | POA: Diagnosis not present

## 2023-05-03 DIAGNOSIS — Z0000 Encounter for general adult medical examination without abnormal findings: Secondary | ICD-10-CM | POA: Diagnosis not present

## 2023-05-03 DIAGNOSIS — I10 Essential (primary) hypertension: Secondary | ICD-10-CM | POA: Diagnosis not present

## 2023-09-06 DIAGNOSIS — Z23 Encounter for immunization: Secondary | ICD-10-CM | POA: Diagnosis not present

## 2023-09-16 DIAGNOSIS — I499 Cardiac arrhythmia, unspecified: Secondary | ICD-10-CM | POA: Diagnosis not present

## 2023-09-16 DIAGNOSIS — R110 Nausea: Secondary | ICD-10-CM | POA: Diagnosis not present

## 2023-09-16 DIAGNOSIS — R5383 Other fatigue: Secondary | ICD-10-CM | POA: Diagnosis not present

## 2023-09-18 DIAGNOSIS — R109 Unspecified abdominal pain: Secondary | ICD-10-CM | POA: Diagnosis not present

## 2023-09-18 DIAGNOSIS — I10 Essential (primary) hypertension: Secondary | ICD-10-CM | POA: Diagnosis not present

## 2023-09-18 DIAGNOSIS — R7303 Prediabetes: Secondary | ICD-10-CM | POA: Diagnosis not present

## 2023-09-18 DIAGNOSIS — E782 Mixed hyperlipidemia: Secondary | ICD-10-CM | POA: Diagnosis not present

## 2023-09-20 ENCOUNTER — Emergency Department (HOSPITAL_COMMUNITY)
Admission: EM | Admit: 2023-09-20 | Discharge: 2023-09-21 | Disposition: A | Discharge status: Home / Self Care | Payer: PPO | Attending: Emergency Medicine | Admitting: Emergency Medicine

## 2023-09-20 ENCOUNTER — Emergency Department (HOSPITAL_COMMUNITY): Payer: PPO

## 2023-09-20 ENCOUNTER — Encounter (HOSPITAL_COMMUNITY): Payer: Self-pay | Admitting: *Deleted

## 2023-09-20 ENCOUNTER — Other Ambulatory Visit: Payer: Self-pay

## 2023-09-20 DIAGNOSIS — M549 Dorsalgia, unspecified: Secondary | ICD-10-CM | POA: Diagnosis not present

## 2023-09-20 DIAGNOSIS — D72829 Elevated white blood cell count, unspecified: Secondary | ICD-10-CM | POA: Diagnosis not present

## 2023-09-20 DIAGNOSIS — R1030 Lower abdominal pain, unspecified: Secondary | ICD-10-CM | POA: Insufficient documentation

## 2023-09-20 DIAGNOSIS — N200 Calculus of kidney: Secondary | ICD-10-CM | POA: Diagnosis not present

## 2023-09-20 DIAGNOSIS — R638 Other symptoms and signs concerning food and fluid intake: Secondary | ICD-10-CM | POA: Insufficient documentation

## 2023-09-20 DIAGNOSIS — R509 Fever, unspecified: Secondary | ICD-10-CM | POA: Diagnosis not present

## 2023-09-20 DIAGNOSIS — N281 Cyst of kidney, acquired: Secondary | ICD-10-CM | POA: Diagnosis not present

## 2023-09-20 DIAGNOSIS — K5900 Constipation, unspecified: Secondary | ICD-10-CM | POA: Diagnosis not present

## 2023-09-20 DIAGNOSIS — A419 Sepsis, unspecified organism: Secondary | ICD-10-CM | POA: Diagnosis not present

## 2023-09-20 DIAGNOSIS — K5730 Diverticulosis of large intestine without perforation or abscess without bleeding: Secondary | ICD-10-CM | POA: Diagnosis not present

## 2023-09-20 DIAGNOSIS — K529 Noninfective gastroenteritis and colitis, unspecified: Secondary | ICD-10-CM

## 2023-09-20 DIAGNOSIS — Z1152 Encounter for screening for COVID-19: Secondary | ICD-10-CM | POA: Diagnosis not present

## 2023-09-20 LAB — URINALYSIS, W/ REFLEX TO CULTURE (INFECTION SUSPECTED)
Bacteria, UA: NONE SEEN
Bilirubin Urine: NEGATIVE
Glucose, UA: NEGATIVE mg/dL
Hgb urine dipstick: NEGATIVE
Ketones, ur: NEGATIVE mg/dL
Leukocytes,Ua: NEGATIVE
Nitrite: NEGATIVE
Protein, ur: NEGATIVE mg/dL
Specific Gravity, Urine: 1.025 (ref 1.005–1.030)
pH: 7 (ref 5.0–8.0)

## 2023-09-20 LAB — COMPREHENSIVE METABOLIC PANEL
ALT: 48 U/L — ABNORMAL HIGH (ref 0–44)
AST: 31 U/L (ref 15–41)
Albumin: 3.3 g/dL — ABNORMAL LOW (ref 3.5–5.0)
Alkaline Phosphatase: 105 U/L (ref 38–126)
Anion gap: 11 (ref 5–15)
BUN: 12 mg/dL (ref 8–23)
CO2: 25 mmol/L (ref 22–32)
Calcium: 9 mg/dL (ref 8.9–10.3)
Chloride: 95 mmol/L — ABNORMAL LOW (ref 98–111)
Creatinine, Ser: 0.87 mg/dL (ref 0.44–1.00)
GFR, Estimated: >60 mL/min (ref >60)
Glucose, Bld: 157 mg/dL — ABNORMAL HIGH (ref 70–99)
Potassium: 3.8 mmol/L (ref 3.5–5.1)
Sodium: 131 mmol/L — ABNORMAL LOW (ref 135–145)
Total Bilirubin: 1.2 mg/dL (ref 0.3–1.2)
Total Protein: 8 g/dL (ref 6.5–8.1)

## 2023-09-20 LAB — CBC
HCT: 40.9 % (ref 36.0–46.0)
Hemoglobin: 13.6 g/dL (ref 12.0–15.0)
MCH: 30.6 pg (ref 26.0–34.0)
MCHC: 33.3 g/dL (ref 30.0–36.0)
MCV: 92.1 fL (ref 80.0–100.0)
Platelets: 571 10*3/uL — ABNORMAL HIGH (ref 150–400)
RBC: 4.44 MIL/uL (ref 3.87–5.11)
RDW: 11.8 % (ref 11.5–15.5)
WBC: 17.3 10*3/uL — ABNORMAL HIGH (ref 4.0–10.5)
nRBC: 0 % (ref 0.0–0.2)

## 2023-09-20 LAB — PROTIME-INR
INR: 1.1 (ref 0.8–1.2)
Prothrombin Time: 14.5 s (ref 11.4–15.2)

## 2023-09-20 LAB — RESP PANEL BY RT-PCR (RSV, FLU A&B, COVID)  RVPGX2
Influenza A by PCR: NEGATIVE
Influenza B by PCR: NEGATIVE
Resp Syncytial Virus by PCR: NEGATIVE
SARS Coronavirus 2 by RT PCR: NEGATIVE

## 2023-09-20 LAB — LIPASE, BLOOD: Lipase: 28 U/L (ref 11–51)

## 2023-09-20 LAB — I-STAT CG4 LACTIC ACID, ED: Lactic Acid, Venous: 1.4 mmol/L (ref 0.5–1.9)

## 2023-09-20 MED ORDER — IOHEXOL 300 MG/ML  SOLN
100.0000 mL | Freq: Once | INTRAMUSCULAR | Status: AC | PRN
  Administered 2023-09-20: 100 mL via INTRAVENOUS

## 2023-09-20 MED ORDER — ONDANSETRON 4 MG PO TBDP
4.0000 mg | ORAL_TABLET | Freq: Once | ORAL | Status: AC | PRN
  Administered 2023-09-20: 4 mg via ORAL
  Filled 2023-09-20: qty 1

## 2023-09-20 MED ORDER — KETOROLAC TROMETHAMINE 30 MG/ML IJ SOLN
30.0000 mg | Freq: Once | INTRAMUSCULAR | Status: AC
  Administered 2023-09-20: 30 mg via INTRAVENOUS
  Filled 2023-09-20: qty 1

## 2023-09-20 NOTE — ED Triage Notes (Signed)
Since last Wednesday pt has been having back and abd pain, going from abd through to back. No N/V/D, seen at [facility] Sunday follow by PCP on Tuesday.Fever today of 103.0 now 99.9 in triage. Pt has had 3 bowel movements today.

## 2023-09-20 NOTE — ED Notes (Signed)
Patient transported to CT 

## 2023-09-20 NOTE — ED Provider Notes (Signed)
[place] EMERGENCY DEPARTMENT AT [facility] Provider Note   CSN: [id number] Arrival date & time: 09/20/23  1748     History  Chief Complaint  Patient presents with   Abdominal Pain   Back Pain   Fever    Elizabeth Odom is a 82 y.o. female.  HPI   82 year old female presents emergency department with concern for suprapubic discomfort.  This has been happening since last Wednesday about a week ago.  Patient states initially the pain was intermittent but now its become persistent and more severe.  Associated with decreased appetite and today she had a fever of 103.  She has had 3 small bowel movements today, denies any diarrhea or constipation.  Sometimes has a difficult time urinating but denies any dysuria or hematuria.  Home Medications Prior to Admission medications   Medication Sig Start Date End Date Taking? Authorizing Provider  meclizine (ANTIVERT) 25 MG tablet Take 25 mg by mouth 3 (three) times daily. Patient not taking: Reported on 11/23/2020    [provider]  Omega-3 Fatty Acids (FISH OIL) 500 MG CAPS Take 1 capsule by mouth daily.    [provider]      Allergies    Elemental sulfur    Review of Systems   Review of Systems  Constitutional:  Positive for appetite change. Negative for fever.  Respiratory:  Negative for shortness of breath.   Cardiovascular:  Negative for chest pain.  Gastrointestinal:  Positive for abdominal pain. Negative for diarrhea and vomiting.  Musculoskeletal:  Positive for back pain.  Skin:  Negative for rash.  Neurological:  Negative for headaches.    Physical Exam Updated Vital Signs BP 136/67   Pulse 80   Temp 99.9 F (37.7 C) (Oral)   Resp (!) 21   Ht 5\' 6"  (1.676 m)   Wt 56.2 kg   SpO2 98%   BMI 20.01 kg/m  Physical Exam Vitals and nursing note reviewed.  Constitutional:      General: She is not in acute distress.    Appearance: Normal appearance.  HENT:     Head: Normocephalic.      Mouth/Throat:     Mouth: Mucous membranes are moist.  Cardiovascular:     Rate and Rhythm: Normal rate.  Pulmonary:     Effort: Pulmonary effort is normal. No respiratory distress.  Abdominal:     General: Bowel sounds are normal.     Palpations: Abdomen is soft.     Tenderness: There is abdominal tenderness in the suprapubic area. There is no guarding.  Skin:    General: Skin is warm.  Neurological:     Mental Status: She is alert and oriented to person, place, and time. Mental status is at baseline.  Psychiatric:        Mood and Affect: Mood normal.     ED Results / Procedures / Treatments   Labs (all labs ordered are listed, but only abnormal results are displayed) Labs Reviewed  COMPREHENSIVE METABOLIC PANEL - Abnormal; Notable for the following components:      Result Value   Sodium 131 (*)    Chloride 95 (*)    Glucose, Bld 157 (*)    Albumin 3.3 (*)    ALT 48 (*)    All other components within normal limits  CBC - Abnormal; Notable for the following components:   WBC 17.3 (*)    Platelets 571 (*)    All other components within normal limits  RESP PANEL BY RT-PCR (RSV, FLU A&B, COVID)  RVPGX2  CULTURE, BLOOD (ROUTINE X 2)  CULTURE, BLOOD (ROUTINE X 2)  LIPASE, BLOOD  PROTIME-INR  URINALYSIS, W/ REFLEX TO CULTURE (INFECTION SUSPECTED)  I-STAT CG4 LACTIC ACID, ED    EKG None  Radiology DG Chest Port 1 View  Result Date: 09/20/2023 CLINICAL DATA:  Sepsis.  Fever. EXAM: PORTABLE CHEST 1 VIEW COMPARISON:  04/09/2016 FINDINGS: The cardiomediastinal contours are normal. The lungs are clear. Pulmonary vasculature is normal. No consolidation, pleural effusion, or pneumothorax. No acute osseous abnormalities are seen. IMPRESSION: No active disease. Electronically Signed   By: Narda Rutherford M.D.   On: 09/20/2023 20:39    Procedures Procedures    Medications Ordered in ED Medications  ondansetron (ZOFRAN-ODT) disintegrating tablet 4 mg (4 mg Oral Given  09/20/23 1832)  ketorolac (TORADOL) 30 MG/ML injection 30 mg (30 mg Intravenous Given 09/20/23 2054)  iohexol (OMNIPAQUE) 300 MG/ML solution 100 mL (100 mLs Intravenous Contrast Given 09/20/23 2057)    ED Course/ Medical Decision Making/ A&P                                 Medical Decision Making Amount and/or Complexity of Data Reviewed Labs: ordered. Radiology: ordered.  Risk Prescription drug management.   82 year old female presents emergency department suprapubic discomfort.  Has been dealing with mild constipation, 3 small bowel movements today.  Also was febrile earlier with decreased appetite.  Blood work shows a leukocytosis, chemistry is otherwise reassuring, respiratory panel is negative.  Urinalysis shows no UTI.  Lactic acid is normal, vitals are reassuring, borderline sirs but doubt sepsis.    CT of the abdomen pelvis shows a small area of small bowel colitis that is also affecting the sigmoid colon.  As well as surrounding constipation.  Will treat the patient as such with antibiotics and increase her MiraLAX intake. Patient otherwise well appearing and tolerating PO. Don't see need for emergent admission at this time.  Patient at this time appears safe and stable for discharge and close outpatient follow up. Discharge plan and strict return to ED precautions discussed, patient verbalizes understanding and agreement.        Final Clinical Impression(s) / ED Diagnoses Final diagnoses:  None    Rx / DC Orders ED Discharge Orders     None         Rozelle Logan, DO 09/21/23 0015

## 2023-09-21 MED ORDER — AMOXICILLIN-POT CLAVULANATE 875-125 MG PO TABS: 1.0000 | ORAL_TABLET | Freq: Two times a day (BID) | ORAL | 0 refills | Status: DC

## 2023-09-21 MED ORDER — AMOXICILLIN-POT CLAVULANATE 875-125 MG PO TABS
1.0000 | ORAL_TABLET | Freq: Once | ORAL | Status: AC
  Administered 2023-09-21: 1 via ORAL
  Filled 2023-09-21: qty 1

## 2023-09-21 NOTE — Discharge Instructions (Signed)
You have been seen and discharged from the emergency department.  You are diagnosed with colitis and constipation.  Take antibiotic as directed.  Stay well-hydrated.  You may increase your MiraLAX dosage to 2-3 times a day until you achieve relief.  The antibiotic may cause loose stools as well.  Follow-up with your primary provider for further evaluation and further care. Take home medications as prescribed. If you have any worsening symptoms or further concerns for your health please return to an emergency department for further evaluation.

## 2023-09-25 LAB — CULTURE, BLOOD (ROUTINE X 2)
Culture: NO GROWTH
Culture: NO GROWTH
Special Requests: ADEQUATE

## 2023-09-27 ENCOUNTER — Other Ambulatory Visit: Payer: Self-pay

## 2023-09-27 ENCOUNTER — Encounter (HOSPITAL_COMMUNITY): Payer: Self-pay | Admitting: *Deleted

## 2023-09-27 ENCOUNTER — Inpatient Hospital Stay (HOSPITAL_COMMUNITY)
Admission: EM | Admit: 2023-09-27 | Discharge: 2023-11-09 | DRG: 329 | Disposition: A | Discharge status: Skilled Nursing Facility | Payer: PPO | Attending: Internal Medicine | Admitting: Internal Medicine

## 2023-09-27 ENCOUNTER — Emergency Department (HOSPITAL_COMMUNITY): Payer: PPO

## 2023-09-27 DIAGNOSIS — C189 Malignant neoplasm of colon, unspecified: Secondary | ICD-10-CM | POA: Diagnosis not present

## 2023-09-27 DIAGNOSIS — R194 Change in bowel habit: Secondary | ICD-10-CM | POA: Diagnosis not present

## 2023-09-27 DIAGNOSIS — G2581 Restless legs syndrome: Secondary | ICD-10-CM | POA: Diagnosis not present

## 2023-09-27 DIAGNOSIS — R109 Unspecified abdominal pain: Secondary | ICD-10-CM | POA: Diagnosis not present

## 2023-09-27 DIAGNOSIS — R4589 Other symptoms and signs involving emotional state: Secondary | ICD-10-CM | POA: Diagnosis not present

## 2023-09-27 DIAGNOSIS — K56699 Other intestinal obstruction unspecified as to partial versus complete obstruction: Secondary | ICD-10-CM | POA: Diagnosis not present

## 2023-09-27 DIAGNOSIS — Z515 Encounter for palliative care: Secondary | ICD-10-CM

## 2023-09-27 DIAGNOSIS — K649 Unspecified hemorrhoids: Secondary | ICD-10-CM | POA: Diagnosis present

## 2023-09-27 DIAGNOSIS — I10 Essential (primary) hypertension: Secondary | ICD-10-CM | POA: Diagnosis not present

## 2023-09-27 DIAGNOSIS — Z86718 Personal history of other venous thrombosis and embolism: Secondary | ICD-10-CM

## 2023-09-27 DIAGNOSIS — K760 Fatty (change of) liver, not elsewhere classified: Secondary | ICD-10-CM | POA: Diagnosis present

## 2023-09-27 DIAGNOSIS — R64 Cachexia: Secondary | ICD-10-CM | POA: Diagnosis not present

## 2023-09-27 DIAGNOSIS — R339 Retention of urine, unspecified: Secondary | ICD-10-CM | POA: Diagnosis not present

## 2023-09-27 DIAGNOSIS — K869 Disease of pancreas, unspecified: Secondary | ICD-10-CM | POA: Diagnosis not present

## 2023-09-27 DIAGNOSIS — Y836 Removal of other organ (partial) (total) as the cause of abnormal reaction of the patient, or of later complication, without mention of misadventure at the time of the procedure: Secondary | ICD-10-CM | POA: Diagnosis not present

## 2023-09-27 DIAGNOSIS — N200 Calculus of kidney: Secondary | ICD-10-CM | POA: Diagnosis not present

## 2023-09-27 DIAGNOSIS — R6889 Other general symptoms and signs: Secondary | ICD-10-CM | POA: Diagnosis not present

## 2023-09-27 DIAGNOSIS — R110 Nausea: Secondary | ICD-10-CM | POA: Diagnosis not present

## 2023-09-27 DIAGNOSIS — Q438 Other specified congenital malformations of intestine: Secondary | ICD-10-CM | POA: Diagnosis not present

## 2023-09-27 DIAGNOSIS — I959 Hypotension, unspecified: Secondary | ICD-10-CM | POA: Diagnosis not present

## 2023-09-27 DIAGNOSIS — K56609 Unspecified intestinal obstruction, unspecified as to partial versus complete obstruction: Secondary | ICD-10-CM | POA: Insufficient documentation

## 2023-09-27 DIAGNOSIS — R739 Hyperglycemia, unspecified: Secondary | ICD-10-CM | POA: Diagnosis present

## 2023-09-27 DIAGNOSIS — Z452 Encounter for adjustment and management of vascular access device: Secondary | ICD-10-CM | POA: Diagnosis not present

## 2023-09-27 DIAGNOSIS — D500 Iron deficiency anemia secondary to blood loss (chronic): Secondary | ICD-10-CM

## 2023-09-27 DIAGNOSIS — E440 Moderate protein-calorie malnutrition: Secondary | ICD-10-CM | POA: Diagnosis not present

## 2023-09-27 DIAGNOSIS — B9562 Methicillin resistant Staphylococcus aureus infection as the cause of diseases classified elsewhere: Secondary | ICD-10-CM | POA: Diagnosis present

## 2023-09-27 DIAGNOSIS — E041 Nontoxic single thyroid nodule: Secondary | ICD-10-CM | POA: Diagnosis not present

## 2023-09-27 DIAGNOSIS — Z90710 Acquired absence of both cervix and uterus: Secondary | ICD-10-CM

## 2023-09-27 DIAGNOSIS — I824Y2 Acute embolism and thrombosis of unspecified deep veins of left proximal lower extremity: Secondary | ICD-10-CM | POA: Diagnosis not present

## 2023-09-27 DIAGNOSIS — K9400 Colostomy complication, unspecified: Secondary | ICD-10-CM | POA: Diagnosis not present

## 2023-09-27 DIAGNOSIS — K660 Peritoneal adhesions (postprocedural) (postinfection): Secondary | ICD-10-CM | POA: Diagnosis present

## 2023-09-27 DIAGNOSIS — I82402 Acute embolism and thrombosis of unspecified deep veins of left lower extremity: Secondary | ICD-10-CM | POA: Insufficient documentation

## 2023-09-27 DIAGNOSIS — R1084 Generalized abdominal pain: Secondary | ICD-10-CM | POA: Diagnosis not present

## 2023-09-27 DIAGNOSIS — I2693 Single subsegmental pulmonary embolism without acute cor pulmonale: Secondary | ICD-10-CM | POA: Diagnosis not present

## 2023-09-27 DIAGNOSIS — I480 Paroxysmal atrial fibrillation: Secondary | ICD-10-CM | POA: Diagnosis present

## 2023-09-27 DIAGNOSIS — I82462 Acute embolism and thrombosis of left calf muscular vein: Secondary | ICD-10-CM | POA: Diagnosis present

## 2023-09-27 DIAGNOSIS — K862 Cyst of pancreas: Secondary | ICD-10-CM | POA: Diagnosis present

## 2023-09-27 DIAGNOSIS — Z79899 Other long term (current) drug therapy: Secondary | ICD-10-CM

## 2023-09-27 DIAGNOSIS — K55019 Acute (reversible) ischemia of small intestine, extent unspecified: Secondary | ICD-10-CM | POA: Diagnosis present

## 2023-09-27 DIAGNOSIS — N9489 Other specified conditions associated with female genital organs and menstrual cycle: Secondary | ICD-10-CM | POA: Diagnosis not present

## 2023-09-27 DIAGNOSIS — R32 Unspecified urinary incontinence: Secondary | ICD-10-CM | POA: Diagnosis not present

## 2023-09-27 DIAGNOSIS — Z882 Allergy status to sulfonamides status: Secondary | ICD-10-CM

## 2023-09-27 DIAGNOSIS — R1032 Left lower quadrant pain: Secondary | ICD-10-CM | POA: Diagnosis present

## 2023-09-27 DIAGNOSIS — E876 Hypokalemia: Secondary | ICD-10-CM | POA: Diagnosis present

## 2023-09-27 DIAGNOSIS — I82442 Acute embolism and thrombosis of left tibial vein: Secondary | ICD-10-CM | POA: Diagnosis not present

## 2023-09-27 DIAGNOSIS — Z5320 Procedure and treatment not carried out because of patient's decision for unspecified reasons: Secondary | ICD-10-CM | POA: Diagnosis present

## 2023-09-27 DIAGNOSIS — K5720 Diverticulitis of large intestine with perforation and abscess without bleeding: Secondary | ICD-10-CM | POA: Diagnosis not present

## 2023-09-27 DIAGNOSIS — T45515A Adverse effect of anticoagulants, initial encounter: Secondary | ICD-10-CM | POA: Diagnosis not present

## 2023-09-27 DIAGNOSIS — Z8541 Personal history of malignant neoplasm of cervix uteri: Secondary | ICD-10-CM

## 2023-09-27 DIAGNOSIS — Z86711 Personal history of pulmonary embolism: Secondary | ICD-10-CM | POA: Diagnosis not present

## 2023-09-27 DIAGNOSIS — K567 Ileus, unspecified: Secondary | ICD-10-CM | POA: Diagnosis not present

## 2023-09-27 DIAGNOSIS — R2681 Unsteadiness on feet: Secondary | ICD-10-CM | POA: Diagnosis not present

## 2023-09-27 DIAGNOSIS — L8990 Pressure ulcer of unspecified site, unspecified stage: Secondary | ICD-10-CM | POA: Insufficient documentation

## 2023-09-27 DIAGNOSIS — K5669 Other partial intestinal obstruction: Secondary | ICD-10-CM | POA: Diagnosis not present

## 2023-09-27 DIAGNOSIS — R310 Gross hematuria: Secondary | ICD-10-CM | POA: Diagnosis not present

## 2023-09-27 DIAGNOSIS — Z7189 Other specified counseling: Secondary | ICD-10-CM | POA: Diagnosis not present

## 2023-09-27 DIAGNOSIS — K661 Hemoperitoneum: Secondary | ICD-10-CM | POA: Diagnosis not present

## 2023-09-27 DIAGNOSIS — K632 Fistula of intestine: Secondary | ICD-10-CM | POA: Diagnosis not present

## 2023-09-27 DIAGNOSIS — Y92230 Patient room in hospital as the place of occurrence of the external cause: Secondary | ICD-10-CM | POA: Diagnosis not present

## 2023-09-27 DIAGNOSIS — E871 Hypo-osmolality and hyponatremia: Secondary | ICD-10-CM

## 2023-09-27 DIAGNOSIS — K6389 Other specified diseases of intestine: Secondary | ICD-10-CM | POA: Diagnosis present

## 2023-09-27 DIAGNOSIS — I110 Hypertensive heart disease with heart failure: Secondary | ICD-10-CM | POA: Diagnosis not present

## 2023-09-27 DIAGNOSIS — Z66 Do not resuscitate: Secondary | ICD-10-CM | POA: Diagnosis not present

## 2023-09-27 DIAGNOSIS — R079 Chest pain, unspecified: Secondary | ICD-10-CM | POA: Diagnosis not present

## 2023-09-27 DIAGNOSIS — N949 Unspecified condition associated with female genital organs and menstrual cycle: Secondary | ICD-10-CM | POA: Diagnosis not present

## 2023-09-27 DIAGNOSIS — R58 Hemorrhage, not elsewhere classified: Secondary | ICD-10-CM | POA: Diagnosis not present

## 2023-09-27 DIAGNOSIS — R52 Pain, unspecified: Secondary | ICD-10-CM | POA: Diagnosis not present

## 2023-09-27 DIAGNOSIS — K56600 Partial intestinal obstruction, unspecified as to cause: Secondary | ICD-10-CM | POA: Diagnosis not present

## 2023-09-27 DIAGNOSIS — I82452 Acute embolism and thrombosis of left peroneal vein: Secondary | ICD-10-CM | POA: Diagnosis not present

## 2023-09-27 DIAGNOSIS — Z6821 Body mass index (BMI) 21.0-21.9, adult: Secondary | ICD-10-CM

## 2023-09-27 DIAGNOSIS — K651 Peritoneal abscess: Secondary | ICD-10-CM | POA: Diagnosis not present

## 2023-09-27 DIAGNOSIS — D6832 Hemorrhagic disorder due to extrinsic circulating anticoagulants: Secondary | ICD-10-CM | POA: Diagnosis not present

## 2023-09-27 DIAGNOSIS — K5730 Diverticulosis of large intestine without perforation or abscess without bleeding: Secondary | ICD-10-CM | POA: Diagnosis not present

## 2023-09-27 DIAGNOSIS — Z9181 History of falling: Secondary | ICD-10-CM

## 2023-09-27 DIAGNOSIS — R338 Other retention of urine: Secondary | ICD-10-CM | POA: Diagnosis not present

## 2023-09-27 DIAGNOSIS — Z01810 Encounter for preprocedural cardiovascular examination: Secondary | ICD-10-CM

## 2023-09-27 DIAGNOSIS — I5032 Chronic diastolic (congestive) heart failure: Secondary | ICD-10-CM | POA: Diagnosis not present

## 2023-09-27 DIAGNOSIS — M6259 Muscle wasting and atrophy, not elsewhere classified, multiple sites: Secondary | ICD-10-CM | POA: Diagnosis not present

## 2023-09-27 DIAGNOSIS — N739 Female pelvic inflammatory disease, unspecified: Secondary | ICD-10-CM | POA: Diagnosis not present

## 2023-09-27 DIAGNOSIS — L89312 Pressure ulcer of right buttock, stage 2: Secondary | ICD-10-CM | POA: Diagnosis not present

## 2023-09-27 DIAGNOSIS — I82432 Acute embolism and thrombosis of left popliteal vein: Secondary | ICD-10-CM | POA: Diagnosis not present

## 2023-09-27 DIAGNOSIS — B372 Candidiasis of skin and nail: Secondary | ICD-10-CM | POA: Diagnosis not present

## 2023-09-27 DIAGNOSIS — N281 Cyst of kidney, acquired: Secondary | ICD-10-CM | POA: Diagnosis present

## 2023-09-27 DIAGNOSIS — I493 Ventricular premature depolarization: Secondary | ICD-10-CM | POA: Diagnosis not present

## 2023-09-27 DIAGNOSIS — T8143XA Infection following a procedure, organ and space surgical site, initial encounter: Secondary | ICD-10-CM | POA: Diagnosis not present

## 2023-09-27 DIAGNOSIS — M6281 Muscle weakness (generalized): Secondary | ICD-10-CM | POA: Diagnosis not present

## 2023-09-27 DIAGNOSIS — R000 Tachycardia, unspecified: Secondary | ICD-10-CM | POA: Diagnosis not present

## 2023-09-27 DIAGNOSIS — T85898A Other specified complication of other internal prosthetic devices, implants and grafts, initial encounter: Secondary | ICD-10-CM | POA: Diagnosis not present

## 2023-09-27 DIAGNOSIS — K6289 Other specified diseases of anus and rectum: Secondary | ICD-10-CM | POA: Diagnosis not present

## 2023-09-27 DIAGNOSIS — I2699 Other pulmonary embolism without acute cor pulmonale: Secondary | ICD-10-CM

## 2023-09-27 DIAGNOSIS — D62 Acute posthemorrhagic anemia: Secondary | ICD-10-CM | POA: Diagnosis not present

## 2023-09-27 DIAGNOSIS — I82409 Acute embolism and thrombosis of unspecified deep veins of unspecified lower extremity: Secondary | ICD-10-CM | POA: Diagnosis not present

## 2023-09-27 DIAGNOSIS — Z4682 Encounter for fitting and adjustment of non-vascular catheter: Secondary | ICD-10-CM | POA: Diagnosis not present

## 2023-09-27 DIAGNOSIS — Z7401 Bed confinement status: Secondary | ICD-10-CM | POA: Diagnosis not present

## 2023-09-27 DIAGNOSIS — M79601 Pain in right arm: Secondary | ICD-10-CM | POA: Diagnosis not present

## 2023-09-27 DIAGNOSIS — R519 Headache, unspecified: Secondary | ICD-10-CM | POA: Diagnosis not present

## 2023-09-27 DIAGNOSIS — R112 Nausea with vomiting, unspecified: Secondary | ICD-10-CM | POA: Diagnosis not present

## 2023-09-27 DIAGNOSIS — Z833 Family history of diabetes mellitus: Secondary | ICD-10-CM

## 2023-09-27 DIAGNOSIS — K529 Noninfective gastroenteritis and colitis, unspecified: Secondary | ICD-10-CM | POA: Diagnosis not present

## 2023-09-27 DIAGNOSIS — K828 Other specified diseases of gallbladder: Secondary | ICD-10-CM | POA: Diagnosis not present

## 2023-09-27 DIAGNOSIS — I700 Atherosclerosis of aorta: Secondary | ICD-10-CM | POA: Diagnosis not present

## 2023-09-27 DIAGNOSIS — K624 Stenosis of anus and rectum: Secondary | ICD-10-CM | POA: Diagnosis present

## 2023-09-27 DIAGNOSIS — Z933 Colostomy status: Secondary | ICD-10-CM | POA: Diagnosis not present

## 2023-09-27 DIAGNOSIS — R7303 Prediabetes: Secondary | ICD-10-CM | POA: Diagnosis present

## 2023-09-27 DIAGNOSIS — R933 Abnormal findings on diagnostic imaging of other parts of digestive tract: Secondary | ICD-10-CM | POA: Diagnosis not present

## 2023-09-27 LAB — CBC
HCT: 42.9 % (ref 36.0–46.0)
Hemoglobin: 14.5 g/dL (ref 12.0–15.0)
MCH: 30.6 pg (ref 26.0–34.0)
MCHC: 33.8 g/dL (ref 30.0–36.0)
MCV: 90.5 fL (ref 80.0–100.0)
Platelets: 735 10*3/uL — ABNORMAL HIGH (ref 150–400)
RBC: 4.74 MIL/uL (ref 3.87–5.11)
RDW: 11.9 % (ref 11.5–15.5)
WBC: 15.5 10*3/uL — ABNORMAL HIGH (ref 4.0–10.5)
nRBC: 0 % (ref 0.0–0.2)

## 2023-09-27 LAB — COMPREHENSIVE METABOLIC PANEL
ALT: 28 U/L (ref 0–44)
AST: 23 U/L (ref 15–41)
Albumin: 3.7 g/dL (ref 3.5–5.0)
Alkaline Phosphatase: 91 U/L (ref 38–126)
Anion gap: 13 (ref 5–15)
BUN: 16 mg/dL (ref 8–23)
CO2: 22 mmol/L (ref 22–32)
Calcium: 9.5 mg/dL (ref 8.9–10.3)
Chloride: 99 mmol/L (ref 98–111)
Creatinine, Ser: 0.9 mg/dL (ref 0.44–1.00)
GFR, Estimated: >60 mL/min (ref >60)
Glucose, Bld: 184 mg/dL — ABNORMAL HIGH (ref 70–99)
Potassium: 3.6 mmol/L (ref 3.5–5.1)
Sodium: 134 mmol/L — ABNORMAL LOW (ref 135–145)
Total Bilirubin: 0.6 mg/dL (ref 0.3–1.2)
Total Protein: 7.8 g/dL (ref 6.5–8.1)

## 2023-09-27 LAB — LIPASE, BLOOD: Lipase: 41 U/L (ref 11–51)

## 2023-09-27 LAB — URINALYSIS, ROUTINE W REFLEX MICROSCOPIC
Bacteria, UA: NONE SEEN
Bilirubin Urine: NEGATIVE
Glucose, UA: NEGATIVE mg/dL
Hgb urine dipstick: NEGATIVE
Ketones, ur: NEGATIVE mg/dL
Nitrite: NEGATIVE
Protein, ur: 30 mg/dL — AB
Specific Gravity, Urine: 1.019 (ref 1.005–1.030)
pH: 5 (ref 5.0–8.0)

## 2023-09-27 MED ORDER — PIPERACILLIN-TAZOBACTAM 3.375 G IVPB
3.3750 g | Freq: Three times a day (TID) | INTRAVENOUS | Status: DC
  Administered 2023-09-27 – 2023-10-02 (×13): 3.375 g via INTRAVENOUS
  Filled 2023-09-27 (×15): qty 50

## 2023-09-27 MED ORDER — KCL IN DEXTROSE-NACL 20-5-0.45 MEQ/L-%-% IV SOLN
INTRAVENOUS | Status: AC
  Filled 2023-09-27 (×2): qty 1000

## 2023-09-27 MED ORDER — LACTATED RINGERS IV BOLUS
1000.0000 mL | Freq: Once | INTRAVENOUS | Status: AC
  Administered 2023-09-27: 1000 mL via INTRAVENOUS

## 2023-09-27 MED ORDER — MORPHINE SULFATE (PF) 4 MG/ML IV SOLN
4.0000 mg | Freq: Once | INTRAVENOUS | Status: AC
  Administered 2023-09-27: 4 mg via INTRAVENOUS
  Filled 2023-09-27: qty 1

## 2023-09-27 MED ORDER — IOHEXOL 300 MG/ML  SOLN
100.0000 mL | Freq: Once | INTRAMUSCULAR | Status: AC | PRN
  Administered 2023-09-27: 100 mL via INTRAVENOUS

## 2023-09-27 MED ORDER — ACETAMINOPHEN 650 MG RE SUPP: 650.0000 mg | Freq: Four times a day (QID) | RECTAL | Status: DC | PRN

## 2023-09-27 MED ORDER — ENOXAPARIN SODIUM 40 MG/0.4ML IJ SOSY
40.0000 mg | PREFILLED_SYRINGE | INTRAMUSCULAR | Status: DC
  Administered 2023-09-28 – 2023-09-30 (×3): 40 mg via SUBCUTANEOUS
  Filled 2023-09-27 (×4): qty 0.4

## 2023-09-27 MED ORDER — HYDROMORPHONE HCL 1 MG/ML IJ SOLN
0.5000 mg | INTRAMUSCULAR | Status: DC | PRN
  Administered 2023-09-28 – 2023-10-02 (×19): 0.5 mg via INTRAVENOUS
  Filled 2023-09-27 (×7): qty 0.5
  Filled 2023-09-27: qty 1
  Filled 2023-09-27 (×10): qty 0.5
  Filled 2023-09-27: qty 1
  Filled 2023-09-27: qty 0.5

## 2023-09-27 MED ORDER — ACETAMINOPHEN 325 MG PO TABS
650.0000 mg | ORAL_TABLET | Freq: Four times a day (QID) | ORAL | Status: DC | PRN
  Administered 2023-10-06: 650 mg via ORAL
  Filled 2023-09-27 (×2): qty 2

## 2023-09-27 MED ORDER — INSULIN ASPART 100 UNIT/ML IJ SOLN
0.0000 [IU] | INTRAMUSCULAR | Status: DC
  Administered 2023-09-28 – 2023-09-29 (×5): 1 [IU] via SUBCUTANEOUS
  Administered 2023-10-01 (×2): 2 [IU] via SUBCUTANEOUS
  Administered 2023-10-01 – 2023-10-02 (×2): 1 [IU] via SUBCUTANEOUS
  Administered 2023-10-03 (×2): 2 [IU] via SUBCUTANEOUS
  Administered 2023-10-03: 1 [IU] via SUBCUTANEOUS
  Administered 2023-10-03 (×2): 2 [IU] via SUBCUTANEOUS
  Administered 2023-10-04 (×3): 1 [IU] via SUBCUTANEOUS
  Administered 2023-10-04: 2 [IU] via SUBCUTANEOUS
  Administered 2023-10-05 – 2023-10-13 (×14): 1 [IU] via SUBCUTANEOUS
  Filled 2023-09-27: qty 0.09

## 2023-09-27 MED ORDER — ONDANSETRON HCL 4 MG/2ML IJ SOLN
4.0000 mg | Freq: Four times a day (QID) | INTRAMUSCULAR | Status: DC | PRN
  Administered 2023-09-28 – 2023-11-05 (×5): 4 mg via INTRAVENOUS
  Filled 2023-09-27 (×5): qty 2

## 2023-09-27 MED ORDER — ONDANSETRON HCL 4 MG PO TABS
4.0000 mg | ORAL_TABLET | Freq: Four times a day (QID) | ORAL | Status: DC | PRN
  Filled 2023-09-27: qty 1

## 2023-09-27 MED ORDER — ONDANSETRON HCL 4 MG/2ML IJ SOLN
4.0000 mg | Freq: Once | INTRAMUSCULAR | Status: AC
  Administered 2023-09-27: 4 mg via INTRAVENOUS
  Filled 2023-09-27: qty 2

## 2023-09-27 NOTE — H&P (Signed)
PCP:   [facility] [place]   Chief Complaint:  Abdominal pain  HPI: This is a pleasant 82 year old female with past medical history of atrial fibrillation, RLS, fall risk.  Patient lives alone but family checks on her regularly.  On 1010, patient presented to [facility], ER with complaints of very very bad stomach pains.  Per patient she additionally had nausea.  At that time she had a Tmax of 103.  CT imaging showed small area of small bowel colitis that was also clipped to the sigmoid colon.  There is also surrounding constipation.  Patient was discharged with p.o. Augmentin and told to increase her MiraLAX.  Per patient she has been compliant with the medication and felt better, however the pain never went away.  Yesterday she developed intractable nausea and vomiting, inability to keep anything down.  Abdominal.  Recent surgery and was severe.  Her pain was is in the suprapubic region, now she states she hurts all over.  She states she has had antibiotics since been on diarrhea.  She denies constipation.  She denies notable weight loss.  She states her appetite is poor but has been poor since her husband passed 2 years ago.  Her last colonoscopy was probably over 10 years ago.  Patient states she would not like to have another.  In the ER vital stable.  WBC 15.5.  CT abdomen pelvis shows wall thickening of the proximal sigmoid colon worrisome for neoplasm. Focal colitis not excluded. There is upstream colonic dilatation with air-fluid levels and large amount of stool compatible with obstruction.  There is also a 2.9 mm cystic mass at the tail of the pancreas.  IV antibiotics Zosyn given EDP.  Admission requested.  Per HPI  Past Medical History: Past Medical History:  Diagnosis Date   A-fib (HCC) 04/09/2016   Acute head injury 04/09/2016   Cervical cancer (HCC)    Cervical spine fracture, initial encounter 04/09/2016   Essential hypertension    History of anemia     Multiple fractures of cervical spine (HCC) 04/09/2016   PAC (premature atrial contraction)    Restless leg    Syncope and collapse 04/09/2016   Past Surgical History:  Procedure Laterality Date   APPENDECTOMY     breast lump removal Left    CERVICAL LAMINECTOMY     complete hysterectomy     EP IMPLANTABLE DEVICE N/A 04/10/2016   Procedure: Loop Recorder Insertion;  Surgeon: Marinus Maw, MD;  Location: [facility] INVASIVE CV LAB;  Service: Cardiovascular;  Laterality: N/A;   HARVEST BONE GRAFT     LOOP RECORDER REMOVAL N/A 07/08/2018   Procedure: LOOP RECORDER REMOVAL;  Surgeon: Marinus Maw, MD;  Location: [facility] INVASIVE CV LAB;  Service: Cardiovascular;  Laterality: N/A;    Medications: Prior to Admission medications   Medication Sig Start Date End Date Taking? Authorizing Provider  amoxicillin-clavulanate (AUGMENTIN) 875-125 MG tablet Take 1 tablet by mouth every 12 (twelve) hours. 09/21/23  Yes Horton, Kristie M, DO  ondansetron (ZOFRAN-ODT) 4 MG disintegrating tablet Take 4 mg by mouth every 8 (eight) hours as needed for vomiting or nausea. 09/16/23  Yes [provider]  meclizine (ANTIVERT) 25 MG tablet Take 25 mg by mouth 3 (three) times daily. Patient not taking: Reported on 11/23/2020    [provider]  Omega-3 Fatty Acids (FISH OIL) 500 MG CAPS Take 1 capsule by mouth daily. Patient not taking: Reported on 09/27/2023    [provider]  Allergies:   Allergies  Allergen Reactions   Elemental Sulfur Anaphylaxis    Social History:  reports that she has never smoked. She has never used smokeless tobacco. She reports that she does not drink alcohol and does not use drugs.  Family History: Family History  Problem Relation Age of Onset   Other Mother        thrombosis   Cancer Sister    Diabetes Sister    Stroke Daughter    Thyroid disease Daughter     Physical Exam: Vitals:   09/27/23 1941 09/27/23 2200  BP: 128/77 (!) 154/87  Pulse: 96 70   Resp: 18 18  Temp: 97.6 F (36.4 C)   TempSrc: Oral   SpO2: 99% 98%    General: A and O x 3, well developed and nourished, no acute distress Eyes: Pink conjunctiva, no scleral icterus ENT: Moist oral mucosa, neck supple, no thyromegaly Lungs: CTA B/L, no wheeze, no crackles, no use of accessory muscles Cardiovascular: regular rate and rhythm, no regurgitation, no gallops, no murmurs. No carotid bruits, no JVD Abdomen: soft, positive BS, some distention, no significant TTP, not an acute abdomen GU: not examined Neuro: CN II - XII grossly intact, sensation intact Musculoskeletal: strength 5/5 all extremities, no  edema Skin: no rash, no subcutaneous crepitation, no decubitus Psych: appropriate patient  Labs on Admission:  Recent Labs    09/27/23 2007  NA 134*  K 3.6  CL 99  CO2 22  GLUCOSE 184*  BUN 16  CREATININE 0.90  CALCIUM 9.5   Recent Labs    09/27/23 2007  AST 23  ALT 28  ALKPHOS 91  BILITOT 0.6  PROT 7.8  ALBUMIN 3.7   Recent Labs    09/27/23 2007  LIPASE 41   Recent Labs    09/27/23 2007  WBC 15.5*  HGB 14.5  HCT 42.9  MCV 90.5  PLT 735*    Micro Results: Recent Results (from the past 240 hour(s))  Culture, blood (Routine x 2)     Status: None   Collection Time: 09/20/23  7:02 PM   Specimen: BLOOD  Result Value Ref Range Status   Specimen Description   Final    BLOOD RIGHT ANTECUBITAL Performed at [facility], [address], [place], [place] [address]    Special Requests   Final    BOTTLES DRAWN AEROBIC AND ANAEROBIC Blood Culture adequate volume Performed at [facility], [address], [place], [place] [address]    Culture   Final    NO GROWTH 5 DAYS Performed at [facility], [address], [place], [place] [address]    Report Status 09/25/2023 FINAL  Final  Resp panel by RT-PCR (RSV, Flu A&B, Covid) Anterior Nasal Swab     Status: None   Collection Time: 09/20/23  7:02 PM    Specimen: Anterior Nasal Swab  Result Value Ref Range Status   SARS Coronavirus 2 by RT PCR NEGATIVE NEGATIVE Final    Comment: (NOTE) SARS-CoV-2 target nucleic acids are NOT DETECTED.  The SARS-CoV-2 RNA is generally detectable in upper respiratory specimens during the acute phase of infection. The lowest concentration of SARS-CoV-2 viral copies this assay can detect is 138 copies/mL. A negative result does not preclude SARS-Cov-2 infection and should not be used as the sole basis for treatment or other patient management decisions. A negative result may occur with  improper specimen collection/handling, submission of specimen other than nasopharyngeal swab,  presence of viral mutation(s) within the areas targeted by this assay, and inadequate number of viral copies(<138 copies/mL). A negative result must be combined with clinical observations, patient history, and epidemiological information. The expected result is Negative.  Fact Sheet for Patients:  BloggerCourse.com  Fact Sheet for Healthcare Providers:  SeriousBroker.it  This test is no t yet approved or cleared by the [place] FDA and  has been authorized for detection and/or diagnosis of SARS-CoV-2 by FDA under an Emergency Use Authorization (EUA). This EUA will remain  in effect (meaning this test can be used) for the duration of the COVID-19 declaration under Section 564(b)(1) of the Act, 21 U.S.C.section 360bbb-3(b)(1), unless the authorization is terminated  or revoked sooner.       Influenza A by PCR NEGATIVE NEGATIVE Final   Influenza B by PCR NEGATIVE NEGATIVE Final    Comment: (NOTE) The Xpert Xpress SARS-CoV-2/FLU/RSV plus assay is intended as an aid in the diagnosis of influenza from Nasopharyngeal swab specimens and should not be used as a sole basis for treatment. Nasal washings and aspirates are unacceptable for Xpert Xpress  SARS-CoV-2/FLU/RSV testing.  Fact Sheet for Patients: BloggerCourse.com  Fact Sheet for Healthcare Providers: SeriousBroker.it  This test is not yet approved or cleared by the [place] FDA and has been authorized for detection and/or diagnosis of SARS-CoV-2 by FDA under an Emergency Use Authorization (EUA). This EUA will remain in effect (meaning this test can be used) for the duration of the COVID-19 declaration under Section 564(b)(1) of the Act, 21 U.S.C. section 360bbb-3(b)(1), unless the authorization is terminated or revoked.     Resp Syncytial Virus by PCR NEGATIVE NEGATIVE Final    Comment: (NOTE) Fact Sheet for Patients: BloggerCourse.com  Fact Sheet for Healthcare Providers: SeriousBroker.it  This test is not yet approved or cleared by the [place] FDA and has been authorized for detection and/or diagnosis of SARS-CoV-2 by FDA under an Emergency Use Authorization (EUA). This EUA will remain in effect (meaning this test can be used) for the duration of the COVID-19 declaration under Section 564(b)(1) of the Act, 21 U.S.C. section 360bbb-3(b)(1), unless the authorization is terminated or revoked.  Performed at [facility], [address] [place], [place] [address]   Culture, blood (Routine x 2)     Status: None   Collection Time: 09/20/23  7:30 PM   Specimen: BLOOD  Result Value Ref Range Status   Specimen Description   Final    BLOOD BLOOD RIGHT HAND Performed at [facility], [address] [place], [place] [address]    Special Requests   Final    BOTTLES DRAWN AEROBIC AND ANAEROBIC Blood Culture results may not be optimal due to an inadequate volume of blood received in culture bottles Performed at [facility], [address], [place], [place] [address]    Culture   Final    NO GROWTH 5  DAYS Performed at [facility], [address], [place], [place] [address]    Report Status 09/25/2023 FINAL  Final     Radiological Exams on Admission: CT ABDOMEN PELVIS W CONTRAST  Result Date: 09/27/2023 CLINICAL DATA:  Acute abdominal pain. EXAM: CT ABDOMEN AND PELVIS WITH CONTRAST TECHNIQUE: Multidetector CT imaging of the abdomen and pelvis was performed using the standard protocol following bolus administration of intravenous contrast. RADIATION DOSE REDUCTION: This exam was performed according to the departmental dose-optimization program which includes automated exposure control, adjustment of the  mA and/or kV according to patient size and/or use of iterative reconstruction technique. CONTRAST:  OMNIPAQUE IOHEXOL 300 MG/ML  SOLN COMPARISON:  CT abdomen and pelvis 09/20/2023 FINDINGS: Lower chest: No acute abnormality. Hepatobiliary: There is diffuse fatty infiltration of the liver. Gallbladder and bile ducts are within normal limits. Pancreas: There is a rounded hypodensity in the tail of the pancreas measuring 9 mm which appears unchanged. No pancreatic ductal dilatation or surrounding inflammatory changes. Spleen: Normal in size without focal abnormality. Adrenals/Urinary Tract: Mildly complex right renal cyst is again seen with thin peripheral septation. This measures 6.9 cm and appears unchanged. Otherwise, the bilateral kidneys, adrenal gland glands, and bladder are within normal limits. Stomach/Bowel: There is wall thickening of the short segment of the proximal sigmoid colon. Upstream to this level of the colon is dilated containing air-fluid levels in the large amount of stool. The appendix is not seen. Small bowel loops and stomach are decompressed. Vascular/Lymphatic: Aortic atherosclerosis. No enlarged abdominal or pelvic lymph nodes. Reproductive: Status post hysterectomy. No adnexal masses. Other: There is a small amount of fluid in the pelvis. There is no focal abdominal  wall hernia. There is no free air. Musculoskeletal: Subcentimeter lucent lesion in the L1 vertebral body, indeterminate. No acute fractures are seen. IMPRESSION: 1. Wall thickening of the proximal sigmoid colon worrisome for neoplasm. Focal colitis not excluded. There is upstream colonic dilatation with air-fluid levels and large amount of stool compatible with obstruction. 2. Small amount of free fluid in the pelvis. 3. Fatty infiltration of the liver. 4. Stable 9 mm cystic lesion in the tail of the pancreas. This can be further evaluated with MRI. 5. Stable mildly complex right renal cyst. No follow-up imaging recommended. 6. Subcentimeter lucent lesion in the L1 vertebral body, indeterminate. Aortic Atherosclerosis (ICD10-I70.0). Electronically Signed   By: Darliss Cheney M.D.   On: 09/27/2023 22:27    Assessment/Plan Present on Admission:  Abdominal pain, possible acute colitis -Continue IV Zosyn -CEA, CA 19-9 ordered -Patient made aware pancreatic mass/malignancy could be in the differential.  Colonoscopy discussed, patient states that she would not like to have a colonoscopy.  For this reason continued for IV antibiotics for possibility of colitis. -Patient denies constipation however present on imaging.  Gentle suppository ordered   Cystic lesion tail of pancreas 9mm -CA 19-9 ordered -MRI abdomen ordered   Hyperglycemia -Sliding scale insulin.    Sencere Symonette 09/27/2023, 11:36 PM

## 2023-09-27 NOTE — ED Provider Notes (Signed)
[place] EMERGENCY DEPARTMENT AT [facility] Provider Note   CSN: [id number] Arrival date & time: 09/27/23  1919     History Chief Complaint  Patient presents with   Abdominal Pain    HPI Elizabeth Odom is a 82 y.o. female presenting for severe left lower quadrant abdominal pain and diarrhea.  She was seen last week for similar.  Had a CT done that showed colitis.  She was on Augmentin until 2 days ago.  In the interim she did have mild symptomatic improvement but yesterday she had severe recurrence of her pain intolerance p.o. intake and 3 episodes of nonbilious nonbloody diarrhea. Denies any fevers or chills but is endorsing nausea and intolerance of p.o. intake throughout the day today.Marland Kitchen History of A-fib, cognitive impairment and frequent falls.  Patient's recorded medical, surgical, social, medication list and allergies were reviewed in the Snapshot window as part of the initial history.   Review of Systems   Review of Systems  Gastrointestinal:  Positive for abdominal pain, diarrhea, nausea and vomiting.    Physical Exam Updated Vital Signs BP (!) 154/87   Pulse 70   Temp 97.6 F (36.4 C) (Oral)   Resp 18   SpO2 98%  Physical Exam Vitals and nursing note reviewed.  Constitutional:      General: She is not in acute distress.    Appearance: She is well-developed. She is ill-appearing.  HENT:     Head: Normocephalic and atraumatic.  Eyes:     Conjunctiva/sclera: Conjunctivae normal.  Cardiovascular:     Rate and Rhythm: Normal rate and regular rhythm.     Heart sounds: No murmur heard. Pulmonary:     Effort: Pulmonary effort is normal. No respiratory distress.     Breath sounds: Normal breath sounds.  Abdominal:     General: There is no distension.     Palpations: Abdomen is soft.     Tenderness: There is abdominal tenderness in the periumbilical area, suprapubic area and left lower quadrant. There is no right CVA tenderness or left CVA tenderness.  Negative signs include McBurney's sign.  Musculoskeletal:        General: No swelling or tenderness. Normal range of motion.     Cervical back: Neck supple.  Skin:    General: Skin is warm and dry.  Neurological:     General: No focal deficit present.     Mental Status: She is alert and oriented to person, place, and time. Mental status is at baseline.     Cranial Nerves: No cranial nerve deficit.      ED Course/ Medical Decision Making/ A&P Clinical Course as of 09/27/23 2345  Thu Sep 27, 2023  2302 CT ABDOMEN PELVIS W CONTRAST [CC]    Clinical Course User Index [CC] [name], MD    Procedures Procedures   Medications Ordered in ED Medications  piperacillin-tazobactam (ZOSYN) IVPB 3.375 g (3.375 g Intravenous New Bag/Given 09/27/23 2320)  enoxaparin (LOVENOX) injection 40 mg (has no administration in time range)  acetaminophen (TYLENOL) tablet 650 mg (has no administration in time range)    Or  acetaminophen (TYLENOL) suppository 650 mg (has no administration in time range)  ondansetron (ZOFRAN) tablet 4 mg (has no administration in time range)    Or  ondansetron (ZOFRAN) injection 4 mg (has no administration in time range)  HYDROmorphone (DILAUDID) injection 0.5 mg (has no administration in time range)  insulin aspart (novoLOG) injection 0-9 Units (has no administration in time range)  dextrose 5 % and 0.45 % NaCl with KCl 20 mEq/L infusion (has no administration in time range)  lactated ringers bolus 1,000 mL (1,000 mLs Intravenous Bolus 09/27/23 2149)  ondansetron (ZOFRAN) injection 4 mg (4 mg Intravenous Given 09/27/23 2149)  morphine (PF) 4 MG/ML injection 4 mg (4 mg Intravenous Given 09/27/23 2149)  iohexol (OMNIPAQUE) 300 MG/ML solution 100 mL (100 mLs Intravenous Contrast Given 09/27/23 2207)   Medical Decision Making:   [name] is a 82 y.o. female who presented to the ED today with abdominal pain, detailed above.    Patient placed on continuous  vitals and telemetry monitoring while in ED which was reviewed periodically.  Complete initial physical exam performed, notably the patient  was HDS in NAD.     Reviewed and confirmed nursing documentation for past medical history, family history, social history.    Initial Assessment:   With the patient's presentation of abdominal pain, most likely diagnosis is nonspecific cause. Other diagnoses were considered including (but not limited to) gastroenteritis, colitis, small bowel obstruction, appendicitis, cholecystitis, pancreatitis, nephrolithiasis, UTI, pyleonephritis. These are considered less likely due to history of present illness and physical exam findings.   This is most consistent with an acute life/limb threatening illness complicated by underlying chronic conditions.   Initial Plan:  CBC/CMP to evaluate for underlying infectious/metabolic etiology for patient's abdominal pain  Lipase to evaluate for pancreatitis  EKG to evaluate for cardiac source of pain  CTAB/Pelvis with contrast to evaluate for structural/surgical etiology of patients' severe abdominal pain.  Urinalysis and repeat physical assessment to evaluate for UTI/Pyelonpehritis  Empiric management of symptoms with escalating pain control and antiemetics as needed.   Initial Study Results:   Laboratory  All laboratory results reviewed without evidence of clinically relevant pathology.   Exceptions include: Leukocytosis white blood count 15.5.   EKG EKG was reviewed independently. Rate, rhythm, axis, intervals all examined and without medically relevant abnormality. ST segments without concerns for elevations.    Radiology All images reviewed independently. Agree with radiology report at this time.   CT ABDOMEN PELVIS W CONTRAST  Result Date: 09/27/2023 CLINICAL DATA:  Acute abdominal pain. EXAM: CT ABDOMEN AND PELVIS WITH CONTRAST TECHNIQUE: Multidetector CT imaging of the abdomen and pelvis was performed using the  standard protocol following bolus administration of intravenous contrast. RADIATION DOSE REDUCTION: This exam was performed according to the departmental dose-optimization program which includes automated exposure control, adjustment of the mA and/or kV according to patient size and/or use of iterative reconstruction technique. CONTRAST:  OMNIPAQUE IOHEXOL 300 MG/ML  SOLN COMPARISON:  CT abdomen and pelvis 09/20/2023 FINDINGS: Lower chest: No acute abnormality. Hepatobiliary: There is diffuse fatty infiltration of the liver. Gallbladder and bile ducts are within normal limits. Pancreas: There is a rounded hypodensity in the tail of the pancreas measuring 9 mm which appears unchanged. No pancreatic ductal dilatation or surrounding inflammatory changes. Spleen: Normal in size without focal abnormality. Adrenals/Urinary Tract: Mildly complex right renal cyst is again seen with thin peripheral septation. This measures 6.9 cm and appears unchanged. Otherwise, the bilateral kidneys, adrenal gland glands, and bladder are within normal limits. Stomach/Bowel: There is wall thickening of the short segment of the proximal sigmoid colon. Upstream to this level of the colon is dilated containing air-fluid levels in the large amount of stool. The appendix is not seen. Small bowel loops and stomach are decompressed. Vascular/Lymphatic: Aortic atherosclerosis. No enlarged abdominal or pelvic lymph nodes. Reproductive: Status post hysterectomy. No adnexal  masses. Other: There is a small amount of fluid in the pelvis. There is no focal abdominal wall hernia. There is no free air. Musculoskeletal: Subcentimeter lucent lesion in the L1 vertebral body, indeterminate. No acute fractures are seen. IMPRESSION: 1. Wall thickening of the proximal sigmoid colon worrisome for neoplasm. Focal colitis not excluded. There is upstream colonic dilatation with air-fluid levels and large amount of stool compatible with obstruction. 2. Small  amount of free fluid in the pelvis. 3. Fatty infiltration of the liver. 4. Stable 9 mm cystic lesion in the tail of the pancreas. This can be further evaluated with MRI. 5. Stable mildly complex right renal cyst. No follow-up imaging recommended. 6. Subcentimeter lucent lesion in the L1 vertebral body, indeterminate. Aortic Atherosclerosis (ICD10-I70.0). Electronically Signed   By: Darliss Cheney M.D.   On: 09/27/2023 22:27      Consults: Case discussed with hospitalist.  Additionally a message to the gastroenterologist [facility]) on-call who responded that they will evaluate in the morning.   Final Reassessment and Plan:   Willing to admission for ongoing colitis versus neoplasm causing developing obstruction.  Bowel rest in interim.  No acute distress at this time.  Will treat as colitis with piperacillin tazobactam given failure of Augmentin IV hydration overnight. Disposition:   Based on the above findings, I believe this patient is stable for admission.    Patient/family educated about specific findings on our evaluation and explained exact reasons for admission.  Patient/family educated about clinical situation and time was allowed to answer questions.   Admission team communicated with and agreed with need for admission. Patient admitted. Patient ready to move at this time.     Emergency Department Medication Summary:   Medications  piperacillin-tazobactam (ZOSYN) IVPB 3.375 g (3.375 g Intravenous New Bag/Given 09/27/23 2320)  enoxaparin (LOVENOX) injection 40 mg (has no administration in time range)  acetaminophen (TYLENOL) tablet 650 mg (has no administration in time range)    Or  acetaminophen (TYLENOL) suppository 650 mg (has no administration in time range)  ondansetron (ZOFRAN) tablet 4 mg (has no administration in time range)    Or  ondansetron (ZOFRAN) injection 4 mg (has no administration in time range)  HYDROmorphone (DILAUDID) injection 0.5 mg (has no administration in time  range)  insulin aspart (novoLOG) injection 0-9 Units (has no administration in time range)  dextrose 5 % and 0.45 % NaCl with KCl 20 mEq/L infusion (has no administration in time range)  lactated ringers bolus 1,000 mL (1,000 mLs Intravenous Bolus 09/27/23 2149)  ondansetron (ZOFRAN) injection 4 mg (4 mg Intravenous Given 09/27/23 2149)  morphine (PF) 4 MG/ML injection 4 mg (4 mg Intravenous Given 09/27/23 2149)  iohexol (OMNIPAQUE) 300 MG/ML solution 100 mL (100 mLs Intravenous Contrast Given 09/27/23 2207)             Clinical Impression:  1. Generalized abdominal pain      Admit   Final Clinical Impression(s) / ED Diagnoses Final diagnoses:  Generalized abdominal pain    Rx / DC Orders ED Discharge Orders     None         [name], MD 09/27/23 2346

## 2023-09-27 NOTE — Discharge Instructions (Addendum)
[facility]      [facility], [place] [phone]  OPEN ABDOMINAL SURGERY: POST OP INSTRUCTIONS  Always review your discharge instruction sheet given to you by the facility where your surgery was performed.  IF YOU HAVE DISABILITY OR FAMILY LEAVE FORMS, YOU MUST BRING THEM TO THE OFFICE FOR PROCESSING.  PLEASE DO NOT GIVE THEM TO YOUR DOCTOR.  A prescription for pain medication may be given to you upon discharge.  Take your pain medication as prescribed, if needed.  If narcotic pain medicine is not needed, then you may take acetaminophen (Tylenol) or ibuprofen (Advil) as needed. Take your usually prescribed medications unless otherwise directed. If you need a refill on your pain medication, please contact your pharmacy. They will contact our office to request authorization.  Prescriptions will not be filled after 5pm or on week-ends. You should follow a light diet the first few days after arrival home, such as soup and crackers, pudding, etc.unless your doctor has advised otherwise. A high-fiber, low fat diet can be resumed as tolerated.   Be sure to include lots of fluids daily. Most patients will experience some swelling and bruising on the chest and neck area.  Ice packs will help.  Swelling and bruising can take several days to resolve Most patients will experience some swelling and bruising in the area of the incision. Ice pack will help. Swelling and bruising can take several days to resolve..  It is common to experience some constipation if taking pain medication after surgery.  Increasing fluid intake and taking a stool softener will usually help or prevent this problem from occurring.  A mild laxative (Milk of Magnesia or Miralax) should be taken according to package directions if there are no bowel movements after 48 hours.  You may have steri-strips (small skin tapes) in place directly over the incision.  These strips should be left on the skin for 7-10 days.  If your surgeon used skin  glue on the incision, you may shower in 24 hours.  The glue will flake off over the next 2-3 weeks.  Any sutures or staples will be removed at the office during your follow-up visit. You may find that a light gauze bandage over your incision may keep your staples from being rubbed or pulled. You may shower and replace the bandage daily. ACTIVITIES:  You may resume regular (light) daily activities beginning the next day--such as daily self-care, walking, climbing stairs--gradually increasing activities as tolerated.  You may have sexual intercourse when it is comfortable.  Refrain from any heavy lifting or straining until approved by your doctor. You may drive when you no longer are taking prescription pain medication, you can comfortably wear a seatbelt, and you can safely maneuver your car and apply brakes Return to Work: ___________________________________ Bonita Quin should see your doctor in the office for a follow-up appointment approximately two weeks after your surgery.  Make sure that you call for this appointment within a day or two after you arrive home to insure a convenient appointment time. OTHER INSTRUCTIONS:  _____________________________________________________________ _____________________________________________________________  WHEN TO CALL YOUR DOCTOR: Fever over 101.0 Inability to urinate Nausea and/or vomiting Extreme swelling or bruising Continued bleeding from incision. Increased pain, redness, or drainage from the incision. Difficulty swallowing or breathing Muscle cramping or spasms. Numbness or tingling in hands or feet or around lips.  The clinic staff is available to answer your questions during regular business hours.  Please don't hesitate to call and ask to speak to one of  the nurses if you have concerns.  For further questions, please visit [email or web address]   Wet to Dry WOUND CARE: - Change dressing once daily - Supplies: sterile saline, kerlex,  scissors, ABD pads, tape  Remove dressing and all packing carefully, moistening with sterile saline as needed to avoid packing/internal dressing sticking to the wound. 2.   Clean edges of skin around the wound with water/gauze, making sure there is no tape debris or leakage left on skin that could cause skin irritation or breakdown. 3.   Dampen and clean kerlex with sterile saline and pack wound from wound base to skin level, making sure to take note of any possible areas of wound tracking, tunneling and packing appropriately. Wound can be packed loosely. Trim kerlex to size if a whole kerlex is not required. 4.   Cover wound with a dry ABD pad and secure with tape.  5.   Write the date/time on the dry dressing/tape to better track when the last dressing change occurred. - apply any skin protectant/powder if recommended by clinician to protect skin/skin folds. - change dressing as needed if leakage occurs, wound gets contaminated, or patient requests to shower. - You may shower daily with wound open and following the shower the wound should be dried and a clean dressing placed.  - Medical grade tape as well as packing supplies can be found at [employer] on [address] or [employer] on [address]. The remaining supplies can be found at your local drug store, [employer] etc.

## 2023-09-27 NOTE — ED Triage Notes (Signed)
PT arrives with her daughter, who reports pt was seen last week with abdominal pain, constipation, fevers- told she had an area of infection and partial blockage. Pt returns today with ongoing vomiting, increased pain. LBM yesterday. Pt daughter reports pt appears weak and not acting herself.

## 2023-09-28 ENCOUNTER — Inpatient Hospital Stay (HOSPITAL_COMMUNITY): Payer: PPO

## 2023-09-28 DIAGNOSIS — K529 Noninfective gastroenteritis and colitis, unspecified: Secondary | ICD-10-CM | POA: Diagnosis not present

## 2023-09-28 LAB — CBC
HCT: 37.5 % (ref 36.0–46.0)
Hemoglobin: 12.5 g/dL (ref 12.0–15.0)
MCH: 30.9 pg (ref 26.0–34.0)
MCHC: 33.3 g/dL (ref 30.0–36.0)
MCV: 92.6 fL (ref 80.0–100.0)
Platelets: 542 10*3/uL — ABNORMAL HIGH (ref 150–400)
RBC: 4.05 MIL/uL (ref 3.87–5.11)
RDW: 12 % (ref 11.5–15.5)
WBC: 14 10*3/uL — ABNORMAL HIGH (ref 4.0–10.5)
nRBC: 0 % (ref 0.0–0.2)

## 2023-09-28 LAB — BASIC METABOLIC PANEL
Anion gap: 9 (ref 5–15)
BUN: 14 mg/dL (ref 8–23)
CO2: 25 mmol/L (ref 22–32)
Calcium: 8.5 mg/dL — ABNORMAL LOW (ref 8.9–10.3)
Chloride: 98 mmol/L (ref 98–111)
Creatinine, Ser: 0.83 mg/dL (ref 0.44–1.00)
GFR, Estimated: >60 mL/min (ref >60)
Glucose, Bld: 120 mg/dL — ABNORMAL HIGH (ref 70–99)
Potassium: 3.3 mmol/L — ABNORMAL LOW (ref 3.5–5.1)
Sodium: 132 mmol/L — ABNORMAL LOW (ref 135–145)

## 2023-09-28 LAB — CBC WITH DIFFERENTIAL/PLATELET
Abs Immature Granulocytes: 0.18 10*3/uL — ABNORMAL HIGH (ref 0.00–0.07)
Basophils Absolute: 0.1 10*3/uL (ref 0.0–0.1)
Basophils Relative: 0 %
Eosinophils Absolute: 0.1 10*3/uL (ref 0.0–0.5)
Eosinophils Relative: 1 %
HCT: 38 % (ref 36.0–46.0)
Hemoglobin: 12.5 g/dL (ref 12.0–15.0)
Immature Granulocytes: 1 %
Lymphocytes Relative: 10 %
Lymphs Abs: 1.4 10*3/uL (ref 0.7–4.0)
MCH: 31.3 pg (ref 26.0–34.0)
MCHC: 32.9 g/dL (ref 30.0–36.0)
MCV: 95 fL (ref 80.0–100.0)
Monocytes Absolute: 0.9 10*3/uL (ref 0.1–1.0)
Monocytes Relative: 7 %
Neutro Abs: 10.9 10*3/uL — ABNORMAL HIGH (ref 1.7–7.7)
Neutrophils Relative %: 81 %
Platelets: 515 10*3/uL — ABNORMAL HIGH (ref 150–400)
RBC: 4 MIL/uL (ref 3.87–5.11)
RDW: 12 % (ref 11.5–15.5)
WBC: 13.5 10*3/uL — ABNORMAL HIGH (ref 4.0–10.5)
nRBC: 0 % (ref 0.0–0.2)

## 2023-09-28 LAB — GLUCOSE, CAPILLARY
Glucose-Capillary: 105 mg/dL — ABNORMAL HIGH (ref 70–99)
Glucose-Capillary: 110 mg/dL — ABNORMAL HIGH (ref 70–99)
Glucose-Capillary: 115 mg/dL — ABNORMAL HIGH (ref 70–99)
Glucose-Capillary: 148 mg/dL — ABNORMAL HIGH (ref 70–99)

## 2023-09-28 LAB — CBG MONITORING, ED
Glucose-Capillary: 125 mg/dL — ABNORMAL HIGH (ref 70–99)
Glucose-Capillary: 137 mg/dL — ABNORMAL HIGH (ref 70–99)
Glucose-Capillary: 137 mg/dL — ABNORMAL HIGH (ref 70–99)

## 2023-09-28 LAB — HEMOGLOBIN A1C
Hgb A1c MFr Bld: 5.8 % — ABNORMAL HIGH (ref 4.8–5.6)
Mean Plasma Glucose: 119.76 mg/dL

## 2023-09-28 LAB — CREATININE, SERUM
Creatinine, Ser: 0.86 mg/dL (ref 0.44–1.00)
GFR, Estimated: >60 mL/min (ref >60)

## 2023-09-28 MED ORDER — GADOBUTROL 1 MMOL/ML IV SOLN
5.0000 mL | Freq: Once | INTRAVENOUS | Status: AC | PRN
  Administered 2023-09-28: 5 mL via INTRAVENOUS

## 2023-09-28 MED ORDER — BISACODYL 10 MG RE SUPP: 10.0000 mg | Freq: Once | RECTAL | Status: DC

## 2023-09-28 MED ORDER — LORAZEPAM 0.5 MG PO TABS
0.5000 mg | ORAL_TABLET | Freq: Once | ORAL | Status: DC | PRN
  Filled 2023-09-28 (×2): qty 1

## 2023-09-28 MED ORDER — RISAQUAD PO CAPS
2.0000 | ORAL_CAPSULE | Freq: Three times a day (TID) | ORAL | Status: DC
  Administered 2023-09-28 – 2023-09-30 (×8): 2 via ORAL
  Filled 2023-09-28 (×17): qty 2

## 2023-09-28 MED ORDER — LORAZEPAM 2 MG/ML IJ SOLN
1.0000 mg | Freq: Once | INTRAMUSCULAR | Status: AC | PRN
  Administered 2023-09-28: 1 mg via INTRAVENOUS
  Filled 2023-09-28: qty 1

## 2023-09-28 MED ORDER — POTASSIUM CHLORIDE CRYS ER 20 MEQ PO TBCR
20.0000 meq | EXTENDED_RELEASE_TABLET | Freq: Once | ORAL | Status: AC
  Administered 2023-09-28: 20 meq via ORAL
  Filled 2023-09-28: qty 1

## 2023-09-28 MED ORDER — FLEET ENEMA RE ENEM: 1.0000 | ENEMA | Freq: Once | RECTAL | Status: DC

## 2023-09-28 NOTE — ED Notes (Signed)
Pt to MRI

## 2023-09-28 NOTE — Progress Notes (Signed)
PROGRESS NOTE Elizabeth Odom  ZOX:[id number] DOB: [birth year] DOA: 09/27/2023 PCP: [facility] [place]  Brief Narrative/Hospital Course: 82 year old female with history of A-fib, RLS, fall risk, anemia who was seen in the ED with last week on 09/20/23 for abdominal pain Suprapubic, decreased appetite and also fever and in ED had CT done that showed colitis and has been on Augmentin until 2 days PTA and was also told to increase her MiraLAX, the nshe had mild symptomatic improvement on 10/16 but had had severe recurrence of her pain and intolerance to oral intake and 3 episodes of nonbilious and nonbloody diarrhea so return to the ED on 09/27/2023. Patient reported diarrhea since he had been on antibiotics initially suprapubic pain now all over unable to keep anything down due to nausea vomiting.  Last colonoscopy probably 10 years ago. In the ED: Vital stable labs showed leukocytosis 15.5. CT ABD W/ contrast>>Wall thickening of the proximal sigmoid colon worrisome for neoplasm. Focal colitis not excluded. There is upstream colonic dilatation with air-fluid levels and large amount of stool compatible with obstruction. Small amount of free fluid in the pelvis. Fatty infiltration of the liver.  9 cm cystic lesion in the tail of the pancreas.  Subcentimeter lucent lesion in the L1 vertebral body indeterminate. Patient is admitted for further management     Subjective: Patient seen and examined. Alert awake resting comfortably Complains of lower abdominal pain Had diarrhea yesterday   Assessment and Plan: Principal Problem:   Acute colitis   Abdominal pain Possible acute colitis Diarrhea: MRI abdomen reviewed.  Still having some abdominal pain, labs with improving leukocytosis, renal function stable.  CEA level pending.  GI has been consulted.  Continue with empiric antibiotics IV fluid hydration pain control, start on clear liquid diets.  Will send stool test for  diarrhea  9 mm Pancreatic cyst: In the pancreatic body/tail junction with differential considerations of pseudocyst or indolent cystic neoplasm. Per consensus criteria, this warrants follow-up with pre and post contrast abdominal MRI at 1 year Patient has been informed. Hyperglycemia:  Hyponatremia: Continue gentle fluids.  Hypokalemia: Replete electrolytes  DVT prophylaxis: enoxaparin (LOVENOX) injection 40 mg Start: 09/28/23 1000 Code Status:   Code Status: Full Code Family Communication: plan of care discussed with patient/none at bedside. Patient status is: Inpatient because of colitis/abdominal pain Level of care: Med-Surg   Dispo: The patient is from: Home, independent            Anticipated disposition: tbd Objective: Vitals last 24 hrs: Vitals:   09/28/23 0439 09/28/23 0539 09/28/23 0830 09/28/23 0909  BP:  104/73 126/69 (!) 126/57  Pulse:  69 65   Resp:  18 16 15   Temp: 97.9 F (36.6 C)  97.8 F (36.6 C) (!) 97.4 F (36.3 C)  TempSrc: Oral  Axillary   SpO2:  97% 98% 97%   Weight change:   Physical Examination: General exam: alert awake, older than stated age HEENT:Oral mucosa moist, Ear/Nose WNL grossly Respiratory system: bilaterally clear BS, no use of accessory muscle Cardiovascular system: S1 & S2 +, No JVD. Gastrointestinal system: Abdomen soft, mild tenderness in lower abdomen,ND, BS+ Nervous System:Alert, awake, moving extremities. Extremities: LE edema neg,distal peripheral pulses palpable.  Skin: No rashes,no icterus. MSK: Normal muscle bulk,tone, power  Medications reviewed:  Scheduled Meds:  acidophilus  2 capsule Oral TID   bisacodyl  10 mg Rectal Once   enoxaparin (LOVENOX) injection  40 mg Subcutaneous Q24H   insulin aspart  0-9  Units Subcutaneous Q4H   sodium phosphate  1 enema Rectal Once  Continuous Infusions:  dextrose 5 % and 0.45 % NaCl with KCl 20 mEq/L 75 mL/hr at 09/28/23 0218   piperacillin-tazobactam Stopped (09/28/23 0318)     Diet Order             Diet NPO time specified Except for: Ice Chips, Sips with Meds  Diet effective now                 No intake or output data in the 24 hours ending 09/28/23 1025 Net IO Since Admission: No IO data has been entered for this period [09/28/23 1025]  Wt Readings from Last 3 Encounters:  09/20/23 56.2 kg  11/23/20 55.3 kg  09/28/20 55.8 kg     Unresulted Labs (From admission, onward)     Start     Ordered   10/04/23 0500  Creatinine, serum  (enoxaparin (LOVENOX)    CrCl >/= 30 ml/min)  Weekly,   R     Comments: while on enoxaparin therapy    09/27/23 2335   09/28/23 0555  Cancer antigen 19-9  Once,   R        09/28/23 0554   09/28/23 0226  CEA  Once,   R        09/28/23 0225          Data Reviewed: I have personally reviewed following labs and imaging studies CBC: Recent Labs  Lab 09/27/23 2007 09/28/23 0025 09/28/23 0648  WBC 15.5* 14.0* 13.5*  NEUTROABS  --   --  10.9*  HGB 14.5 12.5 12.5  HCT 42.9 37.5 38.0  MCV 90.5 92.6 95.0  PLT 735* 542* 515*   Basic Metabolic Panel: Recent Labs  Lab 09/27/23 2007 09/28/23 0025 09/28/23 0648  NA 134*  --  132*  K 3.6  --  3.3*  CL 99  --  98  CO2 22  --  25  GLUCOSE 184*  --  120*  BUN 16  --  14  CREATININE 0.90 0.86 0.83  CALCIUM 9.5  --  8.5*   GFR: Estimated Creatinine Clearance: 46.4 mL/min (by C-G formula based on SCr of 0.83 mg/dL). Liver Function Tests: Recent Labs  Lab 09/27/23 2007  AST 23  ALT 28  ALKPHOS 91  BILITOT 0.6  PROT 7.8  ALBUMIN 3.7   Recent Labs  Lab 09/27/23 2007  LIPASE 41   No results for input(s): "AMMONIA" in the last 168 hours. Coagulation Profile: No results for input(s): "INR", "PROTIME" in the last 168 hours. BNP (last 3 results) No results for input(s): "PROBNP" in the last 8760 hours. HbA1C: Recent Labs    09/28/23 0025  HGBA1C 5.8*  CBG: Recent Labs  Lab 09/28/23 0015 09/28/23 0405 09/28/23 0739  GLUCAP 137* 125* 137*    Recent Results (from the past 240 hour(s))  Culture, blood (Routine x 2)     Status: None   Collection Time: 09/20/23  7:02 PM   Specimen: BLOOD  Result Value Ref Range Status   Specimen Description   Final    BLOOD RIGHT ANTECUBITAL Performed at [facility], [address], [place], [place] [address]    Special Requests   Final    BOTTLES DRAWN AEROBIC AND ANAEROBIC Blood Culture adequate volume Performed at [facility], [address] [place], [place] [address]    Culture   Final    NO GROWTH 5 DAYS Performed  at [facility], [address], [place], [place] [address]    Report Status 09/25/2023 FINAL  Final  Resp panel by RT-PCR (RSV, Flu A&B, Covid) Anterior Nasal Swab     Status: None   Collection Time: 09/20/23  7:02 PM   Specimen: Anterior Nasal Swab  Result Value Ref Range Status   SARS Coronavirus 2 by RT PCR NEGATIVE NEGATIVE Final    Comment: (NOTE) SARS-CoV-2 target nucleic acids are NOT DETECTED.  The SARS-CoV-2 RNA is generally detectable in upper respiratory specimens during the acute phase of infection. The lowest concentration of SARS-CoV-2 viral copies this assay can detect is 138 copies/mL. A negative result does not preclude SARS-Cov-2 infection and should not be used as the sole basis for treatment or other patient management decisions. A negative result may occur with  improper specimen collection/handling, submission of specimen other than nasopharyngeal swab, presence of viral mutation(s) within the areas targeted by this assay, and inadequate number of viral copies(<138 copies/mL). A negative result must be combined with clinical observations, patient history, and epidemiological information. The expected result is Negative.  Fact Sheet for Patients:  BloggerCourse.com  Fact Sheet for Healthcare Providers:  SeriousBroker.it  This test is no t yet  approved or cleared by the [place] FDA and  has been authorized for detection and/or diagnosis of SARS-CoV-2 by FDA under an Emergency Use Authorization (EUA). This EUA will remain  in effect (meaning this test can be used) for the duration of the COVID-19 declaration under Section 564(b)(1) of the Act, 21 U.S.C.section 360bbb-3(b)(1), unless the authorization is terminated  or revoked sooner.       Influenza A by PCR NEGATIVE NEGATIVE Final   Influenza B by PCR NEGATIVE NEGATIVE Final    Comment: (NOTE) The Xpert Xpress SARS-CoV-2/FLU/RSV plus assay is intended as an aid in the diagnosis of influenza from Nasopharyngeal swab specimens and should not be used as a sole basis for treatment. Nasal washings and aspirates are unacceptable for Xpert Xpress SARS-CoV-2/FLU/RSV testing.  Fact Sheet for Patients: BloggerCourse.com  Fact Sheet for Healthcare Providers: SeriousBroker.it  This test is not yet approved or cleared by the [place] FDA and has been authorized for detection and/or diagnosis of SARS-CoV-2 by FDA under an Emergency Use Authorization (EUA). This EUA will remain in effect (meaning this test can be used) for the duration of the COVID-19 declaration under Section 564(b)(1) of the Act, 21 U.S.C. section 360bbb-3(b)(1), unless the authorization is terminated or revoked.     Resp Syncytial Virus by PCR NEGATIVE NEGATIVE Final    Comment: (NOTE) Fact Sheet for Patients: BloggerCourse.com  Fact Sheet for Healthcare Providers: SeriousBroker.it  This test is not yet approved or cleared by the [place] FDA and has been authorized for detection and/or diagnosis of SARS-CoV-2 by FDA under an Emergency Use Authorization (EUA). This EUA will remain in effect (meaning this test can be used) for the duration of the COVID-19 declaration under Section 564(b)(1) of  the Act, 21 U.S.C. section 360bbb-3(b)(1), unless the authorization is terminated or revoked.  Performed at [facility], [address], [place], [place] [address]   Culture, blood (Routine x 2)     Status: None   Collection Time: 09/20/23  7:30 PM   Specimen: BLOOD  Result Value Ref Range Status   Specimen Description   Final    BLOOD BLOOD RIGHT HAND Performed at [facility], [address], [place], [place] [address]  Special Requests   Final    BOTTLES DRAWN AEROBIC AND ANAEROBIC Blood Culture results may not be optimal due to an inadequate volume of blood received in culture bottles Performed at [facility], [address], [place], [place] [address]    Culture   Final    NO GROWTH 5 DAYS Performed at [facility], [address] [place], [place] [address]    Report Status 09/25/2023 FINAL  Final    Antimicrobials: Anti-infectives (From admission, onward)    Start     Dose/Rate Route Frequency Ordered Stop   09/27/23 2315  piperacillin-tazobactam (ZOSYN) IVPB 3.375 g        3.375 g 12.5 mL/hr over 240 Minutes Intravenous Every 8 hours 09/27/23 2305        Culture/Microbiology    Component Value Date/Time   SDES  09/20/2023 1930    BLOOD BLOOD RIGHT HAND Performed at [facility], [address], [place], [place] [address]    SPECREQUEST  09/20/2023 1930    BOTTLES DRAWN AEROBIC AND ANAEROBIC Blood Culture results may not be optimal due to an inadequate volume of blood received in culture bottles Performed at [facility], [address], [place], [place] [address]    CULT  09/20/2023 1930    NO GROWTH 5 DAYS Performed at [facility], [address], [place], [place] [address]    REPTSTATUS 09/25/2023 FINAL 09/20/2023 1930  Radiology Studies: MR ABDOMEN W WO CONTRAST  Result Date: 09/28/2023 CLINICAL DATA:  9 mm pancreatic cystic lesion on CT.  EXAM: MRI ABDOMEN WITHOUT AND WITH CONTRAST TECHNIQUE: Multiplanar multisequence MR imaging of the abdomen was performed both before and after the administration of intravenous contrast. CONTRAST:  5mL GADAVIST GADOBUTROL 1 MMOL/ML IV SOLN COMPARISON:  CT of 1 day prior. FINDINGS: Portions of exam are mild to moderately motion degraded. Lower chest: Mild cardiomegaly, without pericardial or pleural effusion. Hepatobiliary: Normal liver. Normal gallbladder, without biliary ductal dilatation. Pancreas: No main pancreatic duct dilatation. Corresponding the CT abnormality, within the pancreatic body/tail junction, is a cystic lesion which measures up to 9 mm on 19/24. No suspicious postcontrast characteristics including on 42/19. No peripancreatic edema. Spleen:  Normal in size, without focal abnormality. Adrenals/Urinary Tract: Normal adrenal glands. Dominant lower pole right renal cyst at 6.3 cm does not warrant imaging follow-up. Normal left kidney. No hydronephrosis. Stomach/Bowel: Normal stomach and small bowel. Colonic obstruction as detailed on CT. Vascular/Lymphatic: Aortic atherosclerosis. No retroperitoneal or retrocrural adenopathy. Other:  No ascites. Musculoskeletal: No acute osseous abnormality. IMPRESSION: 1. Mild to moderately motion degraded exam. 2. 9 mm cystic lesion within the pancreatic body/tail junction with differential considerations of pseudocyst or indolent cystic neoplasm. Per consensus criteria, this warrants follow-up with pre and post contrast abdominal MRI at 1 year. This recommendation follows ACR consensus guidelines: Management of Incidental Pancreatic Cysts: A White Paper of the ACR Incidental Findings Committee. J Am Coll Radiol 2017;14:911-923. 3.  Aortic Atherosclerosis (ICD10-I70.0). 4. Colonic obstruction, as on CT. Electronically Signed   By: Jeronimo Greaves M.D.   On: 09/28/2023 08:52   MR 3D Recon At Scanner  Result Date: 09/28/2023 CLINICAL DATA:  9 mm pancreatic cystic  lesion on CT. EXAM: MRI ABDOMEN WITHOUT AND WITH CONTRAST TECHNIQUE: Multiplanar multisequence MR imaging of the abdomen was performed both before and after the administration of intravenous contrast. CONTRAST:  5mL GADAVIST GADOBUTROL 1 MMOL/ML IV SOLN COMPARISON:  CT of 1 day prior.  FINDINGS: Portions of exam are mild to moderately motion degraded. Lower chest: Mild cardiomegaly, without pericardial or pleural effusion. Hepatobiliary: Normal liver. Normal gallbladder, without biliary ductal dilatation. Pancreas: No main pancreatic duct dilatation. Corresponding the CT abnormality, within the pancreatic body/tail junction, is a cystic lesion which measures up to 9 mm on 19/24. No suspicious postcontrast characteristics including on 42/19. No peripancreatic edema. Spleen:  Normal in size, without focal abnormality. Adrenals/Urinary Tract: Normal adrenal glands. Dominant lower pole right renal cyst at 6.3 cm does not warrant imaging follow-up. Normal left kidney. No hydronephrosis. Stomach/Bowel: Normal stomach and small bowel. Colonic obstruction as detailed on CT. Vascular/Lymphatic: Aortic atherosclerosis. No retroperitoneal or retrocrural adenopathy. Other:  No ascites. Musculoskeletal: No acute osseous abnormality. IMPRESSION: 1. Mild to moderately motion degraded exam. 2. 9 mm cystic lesion within the pancreatic body/tail junction with differential considerations of pseudocyst or indolent cystic neoplasm. Per consensus criteria, this warrants follow-up with pre and post contrast abdominal MRI at 1 year. This recommendation follows ACR consensus guidelines: Management of Incidental Pancreatic Cysts: A White Paper of the ACR Incidental Findings Committee. J Am Coll Radiol 2017;14:911-923. 3.  Aortic Atherosclerosis (ICD10-I70.0). 4. Colonic obstruction, as on CT. Electronically Signed   By: Jeronimo Greaves M.D.   On: 09/28/2023 08:52   CT ABDOMEN PELVIS W CONTRAST  Result Date: 09/27/2023 CLINICAL DATA:  Acute  abdominal pain. EXAM: CT ABDOMEN AND PELVIS WITH CONTRAST TECHNIQUE: Multidetector CT imaging of the abdomen and pelvis was performed using the standard protocol following bolus administration of intravenous contrast. RADIATION DOSE REDUCTION: This exam was performed according to the departmental dose-optimization program which includes automated exposure control, adjustment of the mA and/or kV according to patient size and/or use of iterative reconstruction technique. CONTRAST:  OMNIPAQUE IOHEXOL 300 MG/ML  SOLN COMPARISON:  CT abdomen and pelvis 09/20/2023 FINDINGS: Lower chest: No acute abnormality. Hepatobiliary: There is diffuse fatty infiltration of the liver. Gallbladder and bile ducts are within normal limits. Pancreas: There is a rounded hypodensity in the tail of the pancreas measuring 9 mm which appears unchanged. No pancreatic ductal dilatation or surrounding inflammatory changes. Spleen: Normal in size without focal abnormality. Adrenals/Urinary Tract: Mildly complex right renal cyst is again seen with thin peripheral septation. This measures 6.9 cm and appears unchanged. Otherwise, the bilateral kidneys, adrenal gland glands, and bladder are within normal limits. Stomach/Bowel: There is wall thickening of the short segment of the proximal sigmoid colon. Upstream to this level of the colon is dilated containing air-fluid levels in the large amount of stool. The appendix is not seen. Small bowel loops and stomach are decompressed. Vascular/Lymphatic: Aortic atherosclerosis. No enlarged abdominal or pelvic lymph nodes. Reproductive: Status post hysterectomy. No adnexal masses. Other: There is a small amount of fluid in the pelvis. There is no focal abdominal wall hernia. There is no free air. Musculoskeletal: Subcentimeter lucent lesion in the L1 vertebral body, indeterminate. No acute fractures are seen. IMPRESSION: 1. Wall thickening of the proximal sigmoid colon worrisome for neoplasm. Focal  colitis not excluded. There is upstream colonic dilatation with air-fluid levels and large amount of stool compatible with obstruction. 2. Small amount of free fluid in the pelvis. 3. Fatty infiltration of the liver. 4. Stable 9 mm cystic lesion in the tail of the pancreas. This can be further evaluated with MRI. 5. Stable mildly complex right renal cyst. No follow-up imaging recommended. 6. Subcentimeter lucent lesion in the L1 vertebral body, indeterminate. Aortic Atherosclerosis (ICD10-I70.0). Electronically Signed   By:  Darliss Cheney M.D.   On: 09/27/2023 22:27     LOS: 1 day   Lanae Boast, MD [facility]  09/28/2023, 10:25 AM

## 2023-09-28 NOTE — H&P (View-Only) (Signed)
[facility] Gastroenterology Consult  Referring Provider: No ref. provider found Primary Care Physician:  [facility] [place] Primary Gastroenterologist: Gentry Fitz  Reason for Consultation: abnormal CT imaging, abdominal pain  SUBJECTIVE:   HPI: Elizabeth Odom is a 82 y.o. female with past medical history significant for atrial fibrillation (on no anticoagulation), history of cervical cancer, hypertension, premature atrial contractions, restless leg syndrome.  Presented to hospital on 09/27/2023 with chief complaint of abdominal pain, nausea and vomiting.  Of note, she was seen in the emergency department on 09/20/23 for similar symptoms of abdominal pain with findings of scattered diverticula along the colon, moderate amount of retained stool, multiple loops of small bowel in the mid left abdomen and pelvis with wall thickening and surrounding fat stranding, wall thickening and fat stranding at the sigmoid colon.  Question possible infectious or inflammatory colitis and she was discharged on antibiotic therapy.  Patient notes that she took antibiotic therapy and had nonbloody diarrhea.  Her abdominal pain persisted and became associated with nausea and vomiting prompting her return to the emergency department.  CT imaging on 09/27/2023 showed diffuse fatty infiltration of the liver, rounded hypodensity in the tail the pancreas 9 mm unchanged, short segment wall thickening of the proximal sigmoid colon with upstream dilatation with air-fluid levels enlargement of stool consistent with obstruction.  MRCP completed on 09/28/2023 showed no main pancreatic duct dilatation, 9 mm cystic lesion of the body/tail concerning for pseudocyst versus indolent cystic neoplasm.  Labs showed sodium 132, potassium 3.3, BUN/creatinine 14/0.83, WBC 13.5, hemoglobin 12.5, platelet 515.  Patient noted that she has not been sick before in her life.  Her neighbors have told her that she is losing weight,  she does not recall amount of weight lost.  She denies any recent changes in bowel habits apart from diarrhea related to the antibiotic use.  She denied chest pain and shortness of breath.  No family history colon cancer.  Reported having colonoscopy greater than 10 years ago with unremarkable findings, no report available for my review.  She is not interested in completing bowel preparation for repeat colonoscopy.  Called and talked with patient's daughter, Stanton Kidney, over telephone. She noted that patient denied having bowel movement x 3 days. She noted having nausea and vomiting.   Past Medical History:  Diagnosis Date   A-fib (HCC) 04/09/2016   Acute head injury 04/09/2016   Cervical cancer (HCC)    Cervical spine fracture, initial encounter 04/09/2016   Essential hypertension    History of anemia    Multiple fractures of cervical spine (HCC) 04/09/2016   PAC (premature atrial contraction)    Restless leg    Syncope and collapse 04/09/2016   Past Surgical History:  Procedure Laterality Date   APPENDECTOMY     breast lump removal Left    CERVICAL LAMINECTOMY     complete hysterectomy     EP IMPLANTABLE DEVICE N/A 04/10/2016   Procedure: Loop Recorder Insertion;  Surgeon: Marinus Maw, MD;  Location: [facility] INVASIVE CV LAB;  Service: Cardiovascular;  Laterality: N/A;   HARVEST BONE GRAFT     LOOP RECORDER REMOVAL N/A 07/08/2018   Procedure: LOOP RECORDER REMOVAL;  Surgeon: Marinus Maw, MD;  Location: [facility] INVASIVE CV LAB;  Service: Cardiovascular;  Laterality: N/A;   Prior to Admission medications   Medication Sig Start Date End Date Taking? Authorizing Provider  amoxicillin-clavulanate (AUGMENTIN) 875-125 MG tablet Take 1 tablet by mouth every 12 (twelve) hours. 09/21/23  Yes Horton,  Clabe Seal, DO  ondansetron (ZOFRAN-ODT) 4 MG disintegrating tablet Take 4 mg by mouth every 8 (eight) hours as needed for vomiting or nausea. 09/16/23  Yes [provider]  meclizine (ANTIVERT) 25 MG  tablet Take 25 mg by mouth 3 (three) times daily. Patient not taking: Reported on 11/23/2020    [provider]  Omega-3 Fatty Acids (FISH OIL) 500 MG CAPS Take 1 capsule by mouth daily. Patient not taking: Reported on 09/27/2023    [provider]   Current Facility-Administered Medications  Medication Dose Route Frequency Provider Last Rate Last Admin   acetaminophen (TYLENOL) tablet 650 mg  650 mg Oral Q6H PRN Crosley, Debby, MD       Or   acetaminophen (TYLENOL) suppository 650 mg  650 mg Rectal Q6H PRN Crosley, Debby, MD       acidophilus (RISAQUAD) capsule 2 capsule  2 capsule Oral TID Joneen Roach, Debby, MD       bisacodyl (DULCOLAX) suppository 10 mg  10 mg Rectal Once Crosley, Debby, MD       dextrose 5 % and 0.45 % NaCl with KCl 20 mEq/L infusion   Intravenous Continuous Crosley, Debby, MD 75 mL/hr at 09/28/23 0218 New Bag at 09/28/23 0218   enoxaparin (LOVENOX) injection 40 mg  40 mg Subcutaneous Q24H Crosley, Debby, MD       HYDROmorphone (DILAUDID) injection 0.5 mg  0.5 mg Intravenous Q3H PRN Crosley, Debby, MD   0.5 mg at 09/28/23 0742   insulin aspart (novoLOG) injection 0-9 Units  0-9 Units Subcutaneous Q4H Crosley, Debby, MD   1 Units at 09/28/23 0841   LORazepam (ATIVAN) tablet 0.5 mg  0.5 mg Oral Once PRN Gery Pray, MD       ondansetron (ZOFRAN) tablet 4 mg  4 mg Oral Q6H PRN Crosley, Debby, MD       Or   ondansetron (ZOFRAN) injection 4 mg  4 mg Intravenous Q6H PRN Crosley, Debby, MD       piperacillin-tazobactam (ZOSYN) IVPB 3.375 g  3.375 g Intravenous Q8H [name], MD   Stopped at 09/28/23 0318   sodium phosphate (FLEET) enema 1 enema  1 enema Rectal Once Gery Pray, MD       Allergies as of 09/27/2023 - Review Complete 09/27/2023  Allergen Reaction Noted   Elemental sulfur Anaphylaxis 04/09/2016   Family History  Problem Relation Age of Onset   Other Mother        thrombosis   Cancer Sister    Diabetes Sister    Stroke Daughter     Thyroid disease Daughter    Social History   Socioeconomic History   Marital status: Widowed    Spouse name: Not on file   Number of children: 3   Years of education: schooling in [place]   Highest education level: Not on file  Occupational History   Not on file  Tobacco Use   Smoking status: Never   Smokeless tobacco: Never  Substance and Sexual Activity   Alcohol use: Never    Alcohol/week: 0.0 standard drinks of alcohol   Drug use: Never   Sexual activity: Not on file  Other Topics Concern   Not on file  Social History Narrative   Lives at home with husband    Caffeine: never   Social Determinants of Health   Financial Resource Strain: Not on file  Food Insecurity: Not on file  Transportation Needs: Not on file  Physical Activity: Not on file  Stress:  Not on file  Social Connections: Not on file  Intimate Partner Violence: Not on file   Review of Systems:  Review of Systems  Respiratory:  Negative for shortness of breath.   Cardiovascular:  Negative for chest pain.  Gastrointestinal:  Positive for abdominal pain, diarrhea, nausea and vomiting.    OBJECTIVE:   Temp:  [97.4 F (36.3 C)-98.1 F (36.7 C)] 97.4 F (36.3 C) (10/18 0909) Pulse Rate:  [50-96] 65 (10/18 0830) Resp:  [15-18] 15 (10/18 0909) BP: (104-154)/(57-87) 126/57 (10/18 0909) SpO2:  [95 %-99 %] 97 % (10/18 0909)   Physical Exam Constitutional:      General: She is not in acute distress.    Appearance: She is not ill-appearing, toxic-appearing or diaphoretic.  HENT:     Mouth/Throat:     Mouth: Mucous membranes are dry.  Cardiovascular:     Rate and Rhythm: Normal rate and regular rhythm.  Pulmonary:     Effort: No respiratory distress.     Breath sounds: Normal breath sounds.  Abdominal:     General: Bowel sounds are normal. There is distension (mild).     Palpations: Abdomen is soft.     Tenderness: There is abdominal tenderness. There is no guarding.  Musculoskeletal:      Right lower leg: No edema.     Left lower leg: No edema.  Skin:    General: Skin is warm and dry.  Neurological:     Mental Status: She is alert.     Labs: Recent Labs    09/27/23 2007 09/28/23 0025 09/28/23 0648  WBC 15.5* 14.0* 13.5*  HGB 14.5 12.5 12.5  HCT 42.9 37.5 38.0  PLT 735* 542* 515*   BMET Recent Labs    09/27/23 2007 09/28/23 0025 09/28/23 0648  NA 134*  --  132*  K 3.6  --  3.3*  CL 99  --  98  CO2 22  --  25  GLUCOSE 184*  --  120*  BUN 16  --  14  CREATININE 0.90 0.86 0.83  CALCIUM 9.5  --  8.5*   LFT Recent Labs    09/27/23 2007  PROT 7.8  ALBUMIN 3.7  AST 23  ALT 28  ALKPHOS 91  BILITOT 0.6   PT/INR No results for input(s): "LABPROT", "INR" in the last 72 hours.  Diagnostic imaging: MR ABDOMEN W WO CONTRAST  Result Date: 09/28/2023 CLINICAL DATA:  9 mm pancreatic cystic lesion on CT. EXAM: MRI ABDOMEN WITHOUT AND WITH CONTRAST TECHNIQUE: Multiplanar multisequence MR imaging of the abdomen was performed both before and after the administration of intravenous contrast. CONTRAST:  5mL GADAVIST GADOBUTROL 1 MMOL/ML IV SOLN COMPARISON:  CT of 1 day prior. FINDINGS: Portions of exam are mild to moderately motion degraded. Lower chest: Mild cardiomegaly, without pericardial or pleural effusion. Hepatobiliary: Normal liver. Normal gallbladder, without biliary ductal dilatation. Pancreas: No main pancreatic duct dilatation. Corresponding the CT abnormality, within the pancreatic body/tail junction, is a cystic lesion which measures up to 9 mm on 19/24. No suspicious postcontrast characteristics including on 42/19. No peripancreatic edema. Spleen:  Normal in size, without focal abnormality. Adrenals/Urinary Tract: Normal adrenal glands. Dominant lower pole right renal cyst at 6.3 cm does not warrant imaging follow-up. Normal left kidney. No hydronephrosis. Stomach/Bowel: Normal stomach and small bowel. Colonic obstruction as detailed on CT.  Vascular/Lymphatic: Aortic atherosclerosis. No retroperitoneal or retrocrural adenopathy. Other:  No ascites. Musculoskeletal: No acute osseous abnormality. IMPRESSION: 1. Mild to moderately motion degraded  exam. 2. 9 mm cystic lesion within the pancreatic body/tail junction with differential considerations of pseudocyst or indolent cystic neoplasm. Per consensus criteria, this warrants follow-up with pre and post contrast abdominal MRI at 1 year. This recommendation follows ACR consensus guidelines: Management of Incidental Pancreatic Cysts: A White Paper of the ACR Incidental Findings Committee. J Am Coll Radiol 2017;14:911-923. 3.  Aortic Atherosclerosis (ICD10-I70.0). 4. Colonic obstruction, as on CT. Electronically Signed   By: Jeronimo Greaves M.D.   On: 09/28/2023 08:52   MR 3D Recon At Scanner  Result Date: 09/28/2023 CLINICAL DATA:  9 mm pancreatic cystic lesion on CT. EXAM: MRI ABDOMEN WITHOUT AND WITH CONTRAST TECHNIQUE: Multiplanar multisequence MR imaging of the abdomen was performed both before and after the administration of intravenous contrast. CONTRAST:  5mL GADAVIST GADOBUTROL 1 MMOL/ML IV SOLN COMPARISON:  CT of 1 day prior. FINDINGS: Portions of exam are mild to moderately motion degraded. Lower chest: Mild cardiomegaly, without pericardial or pleural effusion. Hepatobiliary: Normal liver. Normal gallbladder, without biliary ductal dilatation. Pancreas: No main pancreatic duct dilatation. Corresponding the CT abnormality, within the pancreatic body/tail junction, is a cystic lesion which measures up to 9 mm on 19/24. No suspicious postcontrast characteristics including on 42/19. No peripancreatic edema. Spleen:  Normal in size, without focal abnormality. Adrenals/Urinary Tract: Normal adrenal glands. Dominant lower pole right renal cyst at 6.3 cm does not warrant imaging follow-up. Normal left kidney. No hydronephrosis. Stomach/Bowel: Normal stomach and small bowel. Colonic obstruction as  detailed on CT. Vascular/Lymphatic: Aortic atherosclerosis. No retroperitoneal or retrocrural adenopathy. Other:  No ascites. Musculoskeletal: No acute osseous abnormality. IMPRESSION: 1. Mild to moderately motion degraded exam. 2. 9 mm cystic lesion within the pancreatic body/tail junction with differential considerations of pseudocyst or indolent cystic neoplasm. Per consensus criteria, this warrants follow-up with pre and post contrast abdominal MRI at 1 year. This recommendation follows ACR consensus guidelines: Management of Incidental Pancreatic Cysts: A White Paper of the ACR Incidental Findings Committee. J Am Coll Radiol 2017;14:911-923. 3.  Aortic Atherosclerosis (ICD10-I70.0). 4. Colonic obstruction, as on CT. Electronically Signed   By: Jeronimo Greaves M.D.   On: 09/28/2023 08:52   CT ABDOMEN PELVIS W CONTRAST  Result Date: 09/27/2023 CLINICAL DATA:  Acute abdominal pain. EXAM: CT ABDOMEN AND PELVIS WITH CONTRAST TECHNIQUE: Multidetector CT imaging of the abdomen and pelvis was performed using the standard protocol following bolus administration of intravenous contrast. RADIATION DOSE REDUCTION: This exam was performed according to the departmental dose-optimization program which includes automated exposure control, adjustment of the mA and/or kV according to patient size and/or use of iterative reconstruction technique. CONTRAST:  OMNIPAQUE IOHEXOL 300 MG/ML  SOLN COMPARISON:  CT abdomen and pelvis 09/20/2023 FINDINGS: Lower chest: No acute abnormality. Hepatobiliary: There is diffuse fatty infiltration of the liver. Gallbladder and bile ducts are within normal limits. Pancreas: There is a rounded hypodensity in the tail of the pancreas measuring 9 mm which appears unchanged. No pancreatic ductal dilatation or surrounding inflammatory changes. Spleen: Normal in size without focal abnormality. Adrenals/Urinary Tract: Mildly complex right renal cyst is again seen with thin peripheral septation.  This measures 6.9 cm and appears unchanged. Otherwise, the bilateral kidneys, adrenal gland glands, and bladder are within normal limits. Stomach/Bowel: There is wall thickening of the short segment of the proximal sigmoid colon. Upstream to this level of the colon is dilated containing air-fluid levels in the large amount of stool. The appendix is not seen. Small bowel loops and stomach are decompressed. Vascular/Lymphatic:  Aortic atherosclerosis. No enlarged abdominal or pelvic lymph nodes. Reproductive: Status post hysterectomy. No adnexal masses. Other: There is a small amount of fluid in the pelvis. There is no focal abdominal wall hernia. There is no free air. Musculoskeletal: Subcentimeter lucent lesion in the L1 vertebral body, indeterminate. No acute fractures are seen. IMPRESSION: 1. Wall thickening of the proximal sigmoid colon worrisome for neoplasm. Focal colitis not excluded. There is upstream colonic dilatation with air-fluid levels and large amount of stool compatible with obstruction. 2. Small amount of free fluid in the pelvis. 3. Fatty infiltration of the liver. 4. Stable 9 mm cystic lesion in the tail of the pancreas. This can be further evaluated with MRI. 5. Stable mildly complex right renal cyst. No follow-up imaging recommended. 6. Subcentimeter lucent lesion in the L1 vertebral body, indeterminate. Aortic Atherosclerosis (ICD10-I70.0). Electronically Signed   By: Darliss Cheney M.D.   On: 09/27/2023 22:27    IMPRESSION: Abdominal pain, diffuse Imaging findings concerning for large bowel obstruction Sigmoid colon thickening History hypertension History reported cervical cancer  PLAN: -Patient declined full colonoscopy for evaluation, doubt that she would be able to tolerate full bowel preparation given concern for possible bowel obstruction, she noted that she passed small foul smelling bowel movement today though there is no report of this -Recommend flexible sigmoidoscopy with  enema preparation, patient agreeable to this evaluation, we discussed benefits, alternatives and risks of bleeding/infection/perforation/missed lesion/anesthesia, she verbalized understanding and elected to proceed -Ok for clear liquid diet today  -NPO at midnight for possible flexible sigmoidoscopy on 09/29/23 or 09/30/23 with Dr. Marca Ancona -Called and updated patient's daughter, Stanton Kidney, over telephone, obtained supplemental information, all questions answered   LOS: 1 day   Liliane Shi, DO [facility] Gastroenterology

## 2023-09-28 NOTE — Plan of Care (Signed)
Problem: Fluid Volume: Goal: Ability to maintain a balanced intake and output will improve 09/28/2023 1203 by Shuayb Schepers, Stevphen Meuse, RN Outcome: Progressing  Problem: Education: Goal: Ability to describe self-care measures that may prevent or decrease complications (Diabetes Survival Skills Education) will improve 09/28/2023 1203 by Carlester Kasparek, Stevphen Meuse, RN Outcome: Progressing  Problem: Coping: Goal: Ability to adjust to condition or change in health will improve 09/28/2023 1203 by Carsyn Boster, Stevphen Meuse, RN Outcome: Progressing  Haydee Salter, RN 09/28/23 12:04 PM

## 2023-09-28 NOTE — Anesthesia Preprocedure Evaluation (Signed)
Anesthesia Evaluation  Patient identified by MRN, date of birth, ID band Patient awake    Reviewed: Allergy & Precautions, NPO status , Patient's Chart, lab work & pertinent test results  History of Anesthesia Complications Negative for: history of anesthetic complications  Airway Mallampati: II  TM Distance: >3 FB Neck ROM: Full    Dental  (+) Missing,    Pulmonary neg pulmonary ROS   Pulmonary exam normal        Cardiovascular hypertension, Normal cardiovascular exam+ dysrhythmias Atrial Fibrillation      Neuro/Psych RLS    GI/Hepatic Neg liver ROS,,,Abnormal CT imaging, change in bowel habits   Endo/Other  negative endocrine ROS    Renal/GU negative Renal ROS     Musculoskeletal negative musculoskeletal ROS (+)    Abdominal   Peds  Hematology negative hematology ROS (+)   Anesthesia Other Findings Day of surgery medications reviewed with patient.  Reproductive/Obstetrics negative OB ROS                              Anesthesia Physical Anesthesia Plan  ASA: 2  Anesthesia Plan: MAC   Post-op Pain Management: Minimal or no pain anticipated   Induction:   PONV Risk Score and Plan: 2 and Treatment may vary due to age or medical condition and Propofol infusion  Airway Management Planned: Natural Airway and Simple Face Mask  Additional Equipment: None  Intra-op Plan:   Post-operative Plan:   Informed Consent: I have reviewed the patients History and Physical, chart, labs and discussed the procedure including the risks, benefits and alternatives for the proposed anesthesia with the patient or authorized representative who has indicated his/her understanding and acceptance.       Plan Discussed with: CRNA  Anesthesia Plan Comments:          Anesthesia Quick Evaluation

## 2023-09-28 NOTE — Consult Note (Signed)
[facility] Gastroenterology Consult  Referring Provider: No ref. provider found Primary Care Physician:  [facility] [place] Primary Gastroenterologist: Gentry Fitz  Reason for Consultation: abnormal CT imaging, abdominal pain  SUBJECTIVE:   HPI: Elizabeth Odom is a 82 y.o. female with past medical history significant for atrial fibrillation (on no anticoagulation), history of cervical cancer, hypertension, premature atrial contractions, restless leg syndrome.  Presented to hospital on 09/27/2023 with chief complaint of abdominal pain, nausea and vomiting.  Of note, she was seen in the emergency department on 09/20/23 for similar symptoms of abdominal pain with findings of scattered diverticula along the colon, moderate amount of retained stool, multiple loops of small bowel in the mid left abdomen and pelvis with wall thickening and surrounding fat stranding, wall thickening and fat stranding at the sigmoid colon.  Question possible infectious or inflammatory colitis and she was discharged on antibiotic therapy.  Patient notes that she took antibiotic therapy and had nonbloody diarrhea.  Her abdominal pain persisted and became associated with nausea and vomiting prompting her return to the emergency department.  CT imaging on 09/27/2023 showed diffuse fatty infiltration of the liver, rounded hypodensity in the tail the pancreas 9 mm unchanged, short segment wall thickening of the proximal sigmoid colon with upstream dilatation with air-fluid levels enlargement of stool consistent with obstruction.  MRCP completed on 09/28/2023 showed no main pancreatic duct dilatation, 9 mm cystic lesion of the body/tail concerning for pseudocyst versus indolent cystic neoplasm.  Labs showed sodium 132, potassium 3.3, BUN/creatinine 14/0.83, WBC 13.5, hemoglobin 12.5, platelet 515.  Patient noted that she has not been sick before in her life.  Her neighbors have told her that she is losing weight,  she does not recall amount of weight lost.  She denies any recent changes in bowel habits apart from diarrhea related to the antibiotic use.  She denied chest pain and shortness of breath.  No family history colon cancer.  Reported having colonoscopy greater than 10 years ago with unremarkable findings, no report available for my review.  She is not interested in completing bowel preparation for repeat colonoscopy.  Called and talked with patient's daughter, Stanton Kidney, over telephone. She noted that patient denied having bowel movement x 3 days. She noted having nausea and vomiting.   Past Medical History:  Diagnosis Date   A-fib (HCC) 04/09/2016   Acute head injury 04/09/2016   Cervical cancer (HCC)    Cervical spine fracture, initial encounter 04/09/2016   Essential hypertension    History of anemia    Multiple fractures of cervical spine (HCC) 04/09/2016   PAC (premature atrial contraction)    Restless leg    Syncope and collapse 04/09/2016   Past Surgical History:  Procedure Laterality Date   APPENDECTOMY     breast lump removal Left    CERVICAL LAMINECTOMY     complete hysterectomy     EP IMPLANTABLE DEVICE N/A 04/10/2016   Procedure: Loop Recorder Insertion;  Surgeon: Marinus Maw, MD;  Location: [facility] INVASIVE CV LAB;  Service: Cardiovascular;  Laterality: N/A;   HARVEST BONE GRAFT     LOOP RECORDER REMOVAL N/A 07/08/2018   Procedure: LOOP RECORDER REMOVAL;  Surgeon: Marinus Maw, MD;  Location: [facility] INVASIVE CV LAB;  Service: Cardiovascular;  Laterality: N/A;   Prior to Admission medications   Medication Sig Start Date End Date Taking? Authorizing Provider  amoxicillin-clavulanate (AUGMENTIN) 875-125 MG tablet Take 1 tablet by mouth every 12 (twelve) hours. 09/21/23  Yes Horton,  Clabe Seal, DO  ondansetron (ZOFRAN-ODT) 4 MG disintegrating tablet Take 4 mg by mouth every 8 (eight) hours as needed for vomiting or nausea. 09/16/23  Yes [provider]  meclizine (ANTIVERT) 25 MG  tablet Take 25 mg by mouth 3 (three) times daily. Patient not taking: Reported on 11/23/2020    [provider]  Omega-3 Fatty Acids (FISH OIL) 500 MG CAPS Take 1 capsule by mouth daily. Patient not taking: Reported on 09/27/2023    [provider]   Current Facility-Administered Medications  Medication Dose Route Frequency Provider Last Rate Last Admin   acetaminophen (TYLENOL) tablet 650 mg  650 mg Oral Q6H PRN Crosley, Debby, MD       Or   acetaminophen (TYLENOL) suppository 650 mg  650 mg Rectal Q6H PRN Crosley, Debby, MD       acidophilus (RISAQUAD) capsule 2 capsule  2 capsule Oral TID Joneen Roach, Debby, MD       bisacodyl (DULCOLAX) suppository 10 mg  10 mg Rectal Once Crosley, Debby, MD       dextrose 5 % and 0.45 % NaCl with KCl 20 mEq/L infusion   Intravenous Continuous Crosley, Debby, MD 75 mL/hr at 09/28/23 0218 New Bag at 09/28/23 0218   enoxaparin (LOVENOX) injection 40 mg  40 mg Subcutaneous Q24H Crosley, Debby, MD       HYDROmorphone (DILAUDID) injection 0.5 mg  0.5 mg Intravenous Q3H PRN Crosley, Debby, MD   0.5 mg at 09/28/23 0742   insulin aspart (novoLOG) injection 0-9 Units  0-9 Units Subcutaneous Q4H Crosley, Debby, MD   1 Units at 09/28/23 0841   LORazepam (ATIVAN) tablet 0.5 mg  0.5 mg Oral Once PRN Gery Pray, MD       ondansetron (ZOFRAN) tablet 4 mg  4 mg Oral Q6H PRN Crosley, Debby, MD       Or   ondansetron (ZOFRAN) injection 4 mg  4 mg Intravenous Q6H PRN Crosley, Debby, MD       piperacillin-tazobactam (ZOSYN) IVPB 3.375 g  3.375 g Intravenous Q8H [name], MD   Stopped at 09/28/23 0318   sodium phosphate (FLEET) enema 1 enema  1 enema Rectal Once Gery Pray, MD       Allergies as of 09/27/2023 - Review Complete 09/27/2023  Allergen Reaction Noted   Elemental sulfur Anaphylaxis 04/09/2016   Family History  Problem Relation Age of Onset   Other Mother        thrombosis   Cancer Sister    Diabetes Sister    Stroke Daughter     Thyroid disease Daughter    Social History   Socioeconomic History   Marital status: Widowed    Spouse name: Not on file   Number of children: 3   Years of education: schooling in [place]   Highest education level: Not on file  Occupational History   Not on file  Tobacco Use   Smoking status: Never   Smokeless tobacco: Never  Substance and Sexual Activity   Alcohol use: Never    Alcohol/week: 0.0 standard drinks of alcohol   Drug use: Never   Sexual activity: Not on file  Other Topics Concern   Not on file  Social History Narrative   Lives at home with husband    Caffeine: never   Social Determinants of Health   Financial Resource Strain: Not on file  Food Insecurity: Not on file  Transportation Needs: Not on file  Physical Activity: Not on file  Stress:  Not on file  Social Connections: Not on file  Intimate Partner Violence: Not on file   Review of Systems:  Review of Systems  Respiratory:  Negative for shortness of breath.   Cardiovascular:  Negative for chest pain.  Gastrointestinal:  Positive for abdominal pain, diarrhea, nausea and vomiting.    OBJECTIVE:   Temp:  [97.4 F (36.3 C)-98.1 F (36.7 C)] 97.4 F (36.3 C) (10/18 0909) Pulse Rate:  [50-96] 65 (10/18 0830) Resp:  [15-18] 15 (10/18 0909) BP: (104-154)/(57-87) 126/57 (10/18 0909) SpO2:  [95 %-99 %] 97 % (10/18 0909)   Physical Exam Constitutional:      General: She is not in acute distress.    Appearance: She is not ill-appearing, toxic-appearing or diaphoretic.  HENT:     Mouth/Throat:     Mouth: Mucous membranes are dry.  Cardiovascular:     Rate and Rhythm: Normal rate and regular rhythm.  Pulmonary:     Effort: No respiratory distress.     Breath sounds: Normal breath sounds.  Abdominal:     General: Bowel sounds are normal. There is distension (mild).     Palpations: Abdomen is soft.     Tenderness: There is abdominal tenderness. There is no guarding.  Musculoskeletal:      Right lower leg: No edema.     Left lower leg: No edema.  Skin:    General: Skin is warm and dry.  Neurological:     Mental Status: She is alert.     Labs: Recent Labs    09/27/23 2007 09/28/23 0025 09/28/23 0648  WBC 15.5* 14.0* 13.5*  HGB 14.5 12.5 12.5  HCT 42.9 37.5 38.0  PLT 735* 542* 515*   BMET Recent Labs    09/27/23 2007 09/28/23 0025 09/28/23 0648  NA 134*  --  132*  K 3.6  --  3.3*  CL 99  --  98  CO2 22  --  25  GLUCOSE 184*  --  120*  BUN 16  --  14  CREATININE 0.90 0.86 0.83  CALCIUM 9.5  --  8.5*   LFT Recent Labs    09/27/23 2007  PROT 7.8  ALBUMIN 3.7  AST 23  ALT 28  ALKPHOS 91  BILITOT 0.6   PT/INR No results for input(s): "LABPROT", "INR" in the last 72 hours.  Diagnostic imaging: MR ABDOMEN W WO CONTRAST  Result Date: 09/28/2023 CLINICAL DATA:  9 mm pancreatic cystic lesion on CT. EXAM: MRI ABDOMEN WITHOUT AND WITH CONTRAST TECHNIQUE: Multiplanar multisequence MR imaging of the abdomen was performed both before and after the administration of intravenous contrast. CONTRAST:  5mL GADAVIST GADOBUTROL 1 MMOL/ML IV SOLN COMPARISON:  CT of 1 day prior. FINDINGS: Portions of exam are mild to moderately motion degraded. Lower chest: Mild cardiomegaly, without pericardial or pleural effusion. Hepatobiliary: Normal liver. Normal gallbladder, without biliary ductal dilatation. Pancreas: No main pancreatic duct dilatation. Corresponding the CT abnormality, within the pancreatic body/tail junction, is a cystic lesion which measures up to 9 mm on 19/24. No suspicious postcontrast characteristics including on 42/19. No peripancreatic edema. Spleen:  Normal in size, without focal abnormality. Adrenals/Urinary Tract: Normal adrenal glands. Dominant lower pole right renal cyst at 6.3 cm does not warrant imaging follow-up. Normal left kidney. No hydronephrosis. Stomach/Bowel: Normal stomach and small bowel. Colonic obstruction as detailed on CT.  Vascular/Lymphatic: Aortic atherosclerosis. No retroperitoneal or retrocrural adenopathy. Other:  No ascites. Musculoskeletal: No acute osseous abnormality. IMPRESSION: 1. Mild to moderately motion degraded  exam. 2. 9 mm cystic lesion within the pancreatic body/tail junction with differential considerations of pseudocyst or indolent cystic neoplasm. Per consensus criteria, this warrants follow-up with pre and post contrast abdominal MRI at 1 year. This recommendation follows ACR consensus guidelines: Management of Incidental Pancreatic Cysts: A White Paper of the ACR Incidental Findings Committee. J Am Coll Radiol 2017;14:911-923. 3.  Aortic Atherosclerosis (ICD10-I70.0). 4. Colonic obstruction, as on CT. Electronically Signed   By: Jeronimo Greaves M.D.   On: 09/28/2023 08:52   MR 3D Recon At Scanner  Result Date: 09/28/2023 CLINICAL DATA:  9 mm pancreatic cystic lesion on CT. EXAM: MRI ABDOMEN WITHOUT AND WITH CONTRAST TECHNIQUE: Multiplanar multisequence MR imaging of the abdomen was performed both before and after the administration of intravenous contrast. CONTRAST:  5mL GADAVIST GADOBUTROL 1 MMOL/ML IV SOLN COMPARISON:  CT of 1 day prior. FINDINGS: Portions of exam are mild to moderately motion degraded. Lower chest: Mild cardiomegaly, without pericardial or pleural effusion. Hepatobiliary: Normal liver. Normal gallbladder, without biliary ductal dilatation. Pancreas: No main pancreatic duct dilatation. Corresponding the CT abnormality, within the pancreatic body/tail junction, is a cystic lesion which measures up to 9 mm on 19/24. No suspicious postcontrast characteristics including on 42/19. No peripancreatic edema. Spleen:  Normal in size, without focal abnormality. Adrenals/Urinary Tract: Normal adrenal glands. Dominant lower pole right renal cyst at 6.3 cm does not warrant imaging follow-up. Normal left kidney. No hydronephrosis. Stomach/Bowel: Normal stomach and small bowel. Colonic obstruction as  detailed on CT. Vascular/Lymphatic: Aortic atherosclerosis. No retroperitoneal or retrocrural adenopathy. Other:  No ascites. Musculoskeletal: No acute osseous abnormality. IMPRESSION: 1. Mild to moderately motion degraded exam. 2. 9 mm cystic lesion within the pancreatic body/tail junction with differential considerations of pseudocyst or indolent cystic neoplasm. Per consensus criteria, this warrants follow-up with pre and post contrast abdominal MRI at 1 year. This recommendation follows ACR consensus guidelines: Management of Incidental Pancreatic Cysts: A White Paper of the ACR Incidental Findings Committee. J Am Coll Radiol 2017;14:911-923. 3.  Aortic Atherosclerosis (ICD10-I70.0). 4. Colonic obstruction, as on CT. Electronically Signed   By: Jeronimo Greaves M.D.   On: 09/28/2023 08:52   CT ABDOMEN PELVIS W CONTRAST  Result Date: 09/27/2023 CLINICAL DATA:  Acute abdominal pain. EXAM: CT ABDOMEN AND PELVIS WITH CONTRAST TECHNIQUE: Multidetector CT imaging of the abdomen and pelvis was performed using the standard protocol following bolus administration of intravenous contrast. RADIATION DOSE REDUCTION: This exam was performed according to the departmental dose-optimization program which includes automated exposure control, adjustment of the mA and/or kV according to patient size and/or use of iterative reconstruction technique. CONTRAST:  OMNIPAQUE IOHEXOL 300 MG/ML  SOLN COMPARISON:  CT abdomen and pelvis 09/20/2023 FINDINGS: Lower chest: No acute abnormality. Hepatobiliary: There is diffuse fatty infiltration of the liver. Gallbladder and bile ducts are within normal limits. Pancreas: There is a rounded hypodensity in the tail of the pancreas measuring 9 mm which appears unchanged. No pancreatic ductal dilatation or surrounding inflammatory changes. Spleen: Normal in size without focal abnormality. Adrenals/Urinary Tract: Mildly complex right renal cyst is again seen with thin peripheral septation.  This measures 6.9 cm and appears unchanged. Otherwise, the bilateral kidneys, adrenal gland glands, and bladder are within normal limits. Stomach/Bowel: There is wall thickening of the short segment of the proximal sigmoid colon. Upstream to this level of the colon is dilated containing air-fluid levels in the large amount of stool. The appendix is not seen. Small bowel loops and stomach are decompressed. Vascular/Lymphatic:  Aortic atherosclerosis. No enlarged abdominal or pelvic lymph nodes. Reproductive: Status post hysterectomy. No adnexal masses. Other: There is a small amount of fluid in the pelvis. There is no focal abdominal wall hernia. There is no free air. Musculoskeletal: Subcentimeter lucent lesion in the L1 vertebral body, indeterminate. No acute fractures are seen. IMPRESSION: 1. Wall thickening of the proximal sigmoid colon worrisome for neoplasm. Focal colitis not excluded. There is upstream colonic dilatation with air-fluid levels and large amount of stool compatible with obstruction. 2. Small amount of free fluid in the pelvis. 3. Fatty infiltration of the liver. 4. Stable 9 mm cystic lesion in the tail of the pancreas. This can be further evaluated with MRI. 5. Stable mildly complex right renal cyst. No follow-up imaging recommended. 6. Subcentimeter lucent lesion in the L1 vertebral body, indeterminate. Aortic Atherosclerosis (ICD10-I70.0). Electronically Signed   By: Darliss Cheney M.D.   On: 09/27/2023 22:27    IMPRESSION: Abdominal pain, diffuse Imaging findings concerning for large bowel obstruction Sigmoid colon thickening History hypertension History reported cervical cancer  PLAN: -Patient declined full colonoscopy for evaluation, doubt that she would be able to tolerate full bowel preparation given concern for possible bowel obstruction, she noted that she passed small foul smelling bowel movement today though there is no report of this -Recommend flexible sigmoidoscopy with  enema preparation, patient agreeable to this evaluation, we discussed benefits, alternatives and risks of bleeding/infection/perforation/missed lesion/anesthesia, she verbalized understanding and elected to proceed -Ok for clear liquid diet today  -NPO at midnight for possible flexible sigmoidoscopy on 09/29/23 or 09/30/23 with Dr. Marca Ancona -Called and updated patient's daughter, Stanton Kidney, over telephone, obtained supplemental information, all questions answered   LOS: 1 day   Liliane Shi, DO [facility] Gastroenterology

## 2023-09-28 NOTE — Hospital Course (Addendum)
Brief hospital course: 82 year old with past medical history significant for A-fib, RLS, anemia presented to the ED 10/17 with recurrent abdominal pain, nausea emesis, diarrhea intolerance of oral intake. She was evaluated in the ED 10/10 for abdominal pain, decreased appetite, fever, diagnosed with possible colitis noted on CT abdomen and pelvis discharged on Augmentin. She had recurrence of symptoms and presented back to the hospital. CT initially showed proximal sigmoid colon wall thickening with upstream colonic dilation with air-fluid levels and large amount of stool compatible with obstruction.   Assessment and Plan: Large bowel obstruction. SP Hartman's resection.  Colostomy creation. anastomotic leak with pelvic abscess requiring drain placement. Also had hematoma around the staple line. Postop ileus. Patient noted to have presented abdominal pain with CT abdomen and pelvis done 09/27/2023 with proximal sigmoid colon thickening. -Patient seen in consultation by GI underwent sigmoidoscopy 09/29/2023 which showed a tight stricture found to have a mass, biopsy was negative for malignancy suggesting inflammation. -General Surgery consulted status post exploratory laparotomy Hartman's resection 10/01/2023 with small bowel resection and ostomy per Dr. [name]. -Repeat CT abdomen and pelvis 10/11/2023 with small leak at the anastomosis going into 6.7 cm fluid collection. -Patient seen in consultation by IR underwent drain catheter placement of pelvic abscess on 10/12/2023 with cultures growing MRSA. -Patient seen in consultation by ID and treated with daptomycin and Zosyn. -Repeat CT abdomen and pelvis 10/18/2023 with percutaneous peak tail drain catheter with no persistent anastomotic leak or fistula. -10/22/2023 pelvic drain injection study showed fistulous communication with colon/staple line anastomosis with drain to remain in place. -Per ID no further indication for antibiotics and antibiotics  subsequently discontinued. -Was on TPN which has been weaned off per general surgery recommendations. -Diet advanced to regular diet which patient seems to be tolerating. -Continue daily dressings. -Mobilization. -Patient assessed by IR and patient underwent pelvic drain injection study and drain removal on 11/05/2023.   -Per general surgery.    Hyponatremia -Was on D10 which was subsequently discontinued with improvement with hyponatremia. -Sodium fluctuating however seems to have stabilized currently at 132. -Follow.   Acute blood loss anemia status post perianastomotic hematoma with hematuria -Status post transfusion 4 units PRBCs during the hospitalization. -Status post IVC filter. -Seen in consultation by urology no indication for cystoscopy or intervention at this time. Hemoglobin stable.   Acute PE (small to moderate clot burden) without right heart strain/acute left lower extremity DVT -Patient noted unable to tolerate anticoagulation status post IVC filter placement 10/05/2023. -2D echo with no evidence of right heart strain. -CT scan with no evidence of right heart strain. -Outpatient follow-up with PCP.   Thyroid nodule in the inferior aspect of the isthmus -Outpatient follow-up.    Paroxysmal atrial fibrillation -Rate controlled.   -Not on anticoagulation   Hypokalemia/hypophosphatemia/hypomagnesemia -Patient was on TPN which has been weaned off.   Urinary retention -Status post Foley catheter placement which has been discontinued..   -Monitor urine output.     Hypotension -Resolved. -Follow.    Leukocytosis -Resolved.   Moderate protein calorie malnutrition -TPN weaned off.  Diet advanced.    Pressure injury right Botox stage II/MASD Continue current dressing changes.  Drain wound. Will consult wound care.  Goals of care conversation. Patient tells me that she wants to stop all the therapies. Patient tells me that no one at family can help her out  with care at home. Patient tells me that she does not want to go to SNF and does not want to do dressing  changes and does not want to do with IV and blood work. Patient tells me that she is not depressed. Recommended patient to discuss with the family as she tells me that her family is not agreeable with her current decision. She does not want any medication but she is very concerned with regards to infection in her sacrum. Explained in detail with regards to future prognosis on her current medical stability. Discussed with palliative care.    Further discussion with patient on 11/28.  Currently agreeable to go to SNF and work with physical therapy with ongoing plan for treatment although continues to refuse interventions and therapy that might be helpful for better pain control, diet improvement or appetite improvement.

## 2023-09-28 NOTE — ED Notes (Signed)
ED TO INPATIENT HANDOFF REPORT  Name/Age/Gender Elizabeth Odom 82 y.o. female  Code Status    Code Status Orders  (From admission, onward)           Start     Ordered   09/27/23 2335  Full code  Continuous       Question:  By:  Answer:  Consent: discussion documented in EHR   09/27/23 2335           Code Status History     Date Active Date Inactive Code Status Order ID Comments User Context   04/09/2016 0504 04/10/2016 2032 Full Code [id number]  Ron Parker, MD ED       Home/SNF/Other Home  Chief Complaint Acute colitis [K52.9]  Level of Care/Admitting Diagnosis ED Disposition     ED Disposition  Admit   Condition  --   Comment  Hospital Area: [facility] [[id number]]  Level of Care: Med-Surg [16]  May admit patient to [facility] or [facility] if equivalent level of care is available:: Yes  Covid Evaluation: Asymptomatic - no recent exposure (last 10 days) testing not required  Diagnosis: Acute colitis [295284]  Admitting Physician: Gery Pray [[id number]]  Attending Physician: Gery Pray [[id number]]  Certification:: I certify this patient will need inpatient services for at least 2 midnights  Expected Medical Readiness: 09/29/2023          Medical History Past Medical History:  Diagnosis Date   A-fib (HCC) 04/09/2016   Acute head injury 04/09/2016   Cervical cancer (HCC)    Cervical spine fracture, initial encounter 04/09/2016   Essential hypertension    History of anemia    Multiple fractures of cervical spine (HCC) 04/09/2016   PAC (premature atrial contraction)    Restless leg    Syncope and collapse 04/09/2016    Allergies Allergies  Allergen Reactions   Elemental Sulfur Anaphylaxis    IV Location/Drains/Wounds Patient Lines/Drains/Airways Status     Active Line/Drains/Airways     Name Placement date Placement time Site Days   Peripheral IV 09/27/23 20 G Left Antecubital 09/27/23  2148  Antecubital  1    Peripheral IV 09/28/23 20 G Anterior;Distal;Left Forearm 09/28/23  0216  Forearm  less than 1            Labs/Imaging Results for orders placed or performed during the hospital encounter of 09/27/23 (from the past 48 hour(s))  Lipase, blood     Status: None   Collection Time: 09/27/23  8:07 PM  Result Value Ref Range   Lipase 41 11 - 51 U/L    Comment: Performed at [facility], [address] [place], [place] [address]  Comprehensive metabolic panel     Status: Abnormal   Collection Time: 09/27/23  8:07 PM  Result Value Ref Range   Sodium 134 (L) 135 - 145 mmol/L   Potassium 3.6 3.5 - 5.1 mmol/L   Chloride 99 98 - 111 mmol/L   CO2 22 22 - 32 mmol/L   Glucose, Bld 184 (H) 70 - 99 mg/dL    Comment: Glucose reference range applies only to samples taken after fasting for at least 8 hours.   BUN 16 8 - 23 mg/dL   Creatinine, Ser 0.10 0.44 - 1.00 mg/dL   Calcium 9.5 8.9 - 27.2 mg/dL   Total Protein 7.8 6.5 - 8.1 g/dL   Albumin 3.7 3.5 - 5.0 g/dL   AST 23 15 - 41 U/L  ALT 28 0 - 44 U/L   Alkaline Phosphatase 91 38 - 126 U/L   Total Bilirubin 0.6 0.3 - 1.2 mg/dL   GFR, Estimated >60 >45 mL/min    Comment: (NOTE) Calculated using the CKD-EPI Creatinine Equation (2021)    Anion gap 13 5 - 15    Comment: Performed at [facility], [address], [place], [place] [address]  CBC     Status: Abnormal   Collection Time: 09/27/23  8:07 PM  Result Value Ref Range   WBC 15.5 (H) 4.0 - 10.5 K/uL   RBC 4.74 3.87 - 5.11 MIL/uL   Hemoglobin 14.5 12.0 - 15.0 g/dL   HCT 19.1 47.8 - 29.5 %   MCV 90.5 80.0 - 100.0 fL   MCH 30.6 26.0 - 34.0 pg   MCHC 33.8 30.0 - 36.0 g/dL   RDW 62.1 30.8 - 65.7 %   Platelets 735 (H) 150 - 400 K/uL   nRBC 0.0 0.0 - 0.2 %    Comment: Performed at [facility], [address], [place], [place] [address]  Urinalysis, Routine w reflex microscopic -Urine, Clean Catch     Status: Abnormal   Collection  Time: 09/27/23 10:05 PM  Result Value Ref Range   Color, Urine YELLOW YELLOW   APPearance HAZY (A) CLEAR   Specific Gravity, Urine 1.019 1.005 - 1.030   pH 5.0 5.0 - 8.0   Glucose, UA NEGATIVE NEGATIVE mg/dL   Hgb urine dipstick NEGATIVE NEGATIVE   Bilirubin Urine NEGATIVE NEGATIVE   Ketones, ur NEGATIVE NEGATIVE mg/dL   Protein, ur 30 (A) NEGATIVE mg/dL   Nitrite NEGATIVE NEGATIVE   Leukocytes,Ua TRACE (A) NEGATIVE   RBC / HPF 0-5 0 - 5 RBC/hpf   WBC, UA 0-5 0 - 5 WBC/hpf   Bacteria, UA NONE SEEN NONE SEEN   Squamous Epithelial / HPF 0-5 0 - 5 /HPF   Mucus PRESENT    Hyaline Casts, UA PRESENT     Comment: Performed at [facility], [address], [place], [place] [address]  CBG monitoring, ED     Status: Abnormal   Collection Time: 09/28/23 12:15 AM  Result Value Ref Range   Glucose-Capillary 137 (H) 70 - 99 mg/dL    Comment: Glucose reference range applies only to samples taken after fasting for at least 8 hours.  CBC     Status: Abnormal   Collection Time: 09/28/23 12:25 AM  Result Value Ref Range   WBC 14.0 (H) 4.0 - 10.5 K/uL   RBC 4.05 3.87 - 5.11 MIL/uL   Hemoglobin 12.5 12.0 - 15.0 g/dL   HCT 41.3 24.4 - 01.0 %   MCV 92.6 80.0 - 100.0 fL   MCH 30.9 26.0 - 34.0 pg   MCHC 33.3 30.0 - 36.0 g/dL   RDW 27.2 53.6 - 64.4 %   Platelets 542 (H) 150 - 400 K/uL   nRBC 0.0 0.0 - 0.2 %    Comment: Performed at [facility], [address] [place], [place] [address]  Creatinine, serum     Status: None   Collection Time: 09/28/23 12:25 AM  Result Value Ref Range   Creatinine, Ser 0.86 0.44 - 1.00 mg/dL   GFR, Estimated >25 >95 mL/min    Comment: (NOTE) Calculated using the CKD-EPI Creatinine Equation (2021) Performed at [facility], [address], [place], [place] [address]   CBG monitoring, ED     Status: Abnormal  Collection Time: 09/28/23  4:05 AM  Result Value Ref Range   Glucose-Capillary 125 (H) 70 - 99  mg/dL    Comment: Glucose reference range applies only to samples taken after fasting for at least 8 hours.  Basic metabolic panel     Status: Abnormal   Collection Time: 09/28/23  6:48 AM  Result Value Ref Range   Sodium 132 (L) 135 - 145 mmol/L   Potassium 3.3 (L) 3.5 - 5.1 mmol/L   Chloride 98 98 - 111 mmol/L   CO2 25 22 - 32 mmol/L   Glucose, Bld 120 (H) 70 - 99 mg/dL    Comment: Glucose reference range applies only to samples taken after fasting for at least 8 hours.   BUN 14 8 - 23 mg/dL   Creatinine, Ser 9.62 0.44 - 1.00 mg/dL   Calcium 8.5 (L) 8.9 - 10.3 mg/dL   GFR, Estimated >95 >28 mL/min    Comment: (NOTE) Calculated using the CKD-EPI Creatinine Equation (2021)    Anion gap 9 5 - 15    Comment: Performed at [facility], [address], [place], [place] [address]  CBC with Differential/Platelet     Status: Abnormal   Collection Time: 09/28/23  6:48 AM  Result Value Ref Range   WBC 13.5 (H) 4.0 - 10.5 K/uL   RBC 4.00 3.87 - 5.11 MIL/uL   Hemoglobin 12.5 12.0 - 15.0 g/dL   HCT 40.1 02.7 - 25.3 %   MCV 95.0 80.0 - 100.0 fL   MCH 31.3 26.0 - 34.0 pg   MCHC 32.9 30.0 - 36.0 g/dL   RDW 66.4 40.3 - 47.4 %   Platelets 515 (H) 150 - 400 K/uL   nRBC 0.0 0.0 - 0.2 %   Neutrophils Relative % 81 %   Neutro Abs 10.9 (H) 1.7 - 7.7 K/uL   Lymphocytes Relative 10 %   Lymphs Abs 1.4 0.7 - 4.0 K/uL   Monocytes Relative 7 %   Monocytes Absolute 0.9 0.1 - 1.0 K/uL   Eosinophils Relative 1 %   Eosinophils Absolute 0.1 0.0 - 0.5 K/uL   Basophils Relative 0 %   Basophils Absolute 0.1 0.0 - 0.1 K/uL   Immature Granulocytes 1 %   Abs Immature Granulocytes 0.18 (H) 0.00 - 0.07 K/uL    Comment: Performed at [facility], [address], [place], [place] [address]  CBG monitoring, ED     Status: Abnormal   Collection Time: 09/28/23  7:39 AM  Result Value Ref Range   Glucose-Capillary 137 (H) 70 - 99 mg/dL    Comment: Glucose reference range  applies only to samples taken after fasting for at least 8 hours.   CT ABDOMEN PELVIS W CONTRAST  Result Date: 09/27/2023 CLINICAL DATA:  Acute abdominal pain. EXAM: CT ABDOMEN AND PELVIS WITH CONTRAST TECHNIQUE: Multidetector CT imaging of the abdomen and pelvis was performed using the standard protocol following bolus administration of intravenous contrast. RADIATION DOSE REDUCTION: This exam was performed according to the departmental dose-optimization program which includes automated exposure control, adjustment of the mA and/or kV according to patient size and/or use of iterative reconstruction technique. CONTRAST:  OMNIPAQUE IOHEXOL 300 MG/ML  SOLN COMPARISON:  CT abdomen and pelvis 09/20/2023 FINDINGS: Lower chest: No acute abnormality. Hepatobiliary: There is diffuse fatty infiltration of the liver. Gallbladder and bile ducts are within normal limits. Pancreas: There is a rounded hypodensity in the tail of the pancreas measuring 9 mm which appears  unchanged. No pancreatic ductal dilatation or surrounding inflammatory changes. Spleen: Normal in size without focal abnormality. Adrenals/Urinary Tract: Mildly complex right renal cyst is again seen with thin peripheral septation. This measures 6.9 cm and appears unchanged. Otherwise, the bilateral kidneys, adrenal gland glands, and bladder are within normal limits. Stomach/Bowel: There is wall thickening of the short segment of the proximal sigmoid colon. Upstream to this level of the colon is dilated containing air-fluid levels in the large amount of stool. The appendix is not seen. Small bowel loops and stomach are decompressed. Vascular/Lymphatic: Aortic atherosclerosis. No enlarged abdominal or pelvic lymph nodes. Reproductive: Status post hysterectomy. No adnexal masses. Other: There is a small amount of fluid in the pelvis. There is no focal abdominal wall hernia. There is no free air. Musculoskeletal: Subcentimeter lucent lesion in the L1  vertebral body, indeterminate. No acute fractures are seen. IMPRESSION: 1. Wall thickening of the proximal sigmoid colon worrisome for neoplasm. Focal colitis not excluded. There is upstream colonic dilatation with air-fluid levels and large amount of stool compatible with obstruction. 2. Small amount of free fluid in the pelvis. 3. Fatty infiltration of the liver. 4. Stable 9 mm cystic lesion in the tail of the pancreas. This can be further evaluated with MRI. 5. Stable mildly complex right renal cyst. No follow-up imaging recommended. 6. Subcentimeter lucent lesion in the L1 vertebral body, indeterminate. Aortic Atherosclerosis (ICD10-I70.0). Electronically Signed   By: Darliss Cheney M.D.   On: 09/27/2023 22:27    Pending Labs Unresulted Labs (From admission, onward)     Start     Ordered   10/04/23 0500  Creatinine, serum  (enoxaparin (LOVENOX)    CrCl >/= 30 ml/min)  Weekly,   R     Comments: while on enoxaparin therapy    09/27/23 2335   09/28/23 0555  Cancer antigen 19-9  Once,   R        09/28/23 0554   09/28/23 0226  CEA  Once,   R        09/28/23 0225   09/27/23 2336  Hemoglobin A1c  Once,   R       Comments: To assess prior glycemic control    09/27/23 2335            Vitals/Pain Today's Vitals   09/28/23 0058 09/28/23 0225 09/28/23 0439 09/28/23 0539  BP:  107/67  104/73  Pulse:  (!) 50  69  Resp:  16  18  Temp:   97.9 F (36.6 C)   TempSrc:   Oral   SpO2:  95%  97%  PainSc: 9        Isolation Precautions No active isolations  Medications Medications  piperacillin-tazobactam (ZOSYN) IVPB 3.375 g (0 g Intravenous Stopped 09/28/23 0318)  enoxaparin (LOVENOX) injection 40 mg (has no administration in time range)  acetaminophen (TYLENOL) tablet 650 mg (has no administration in time range)    Or  acetaminophen (TYLENOL) suppository 650 mg (has no administration in time range)  ondansetron (ZOFRAN) tablet 4 mg (has no administration in time range)    Or   ondansetron (ZOFRAN) injection 4 mg (has no administration in time range)  HYDROmorphone (DILAUDID) injection 0.5 mg (0.5 mg Intravenous Given 09/28/23 0742)  insulin aspart (novoLOG) injection 0-9 Units (1 Units Subcutaneous Given 09/28/23 0411)  dextrose 5 % and 0.45 % NaCl with KCl 20 mEq/L infusion ( Intravenous New Bag/Given 09/28/23 0218)  sodium phosphate (FLEET) enema 1 enema (0 enemas Rectal Hold 09/28/23  0248)  acidophilus (RISAQUAD) capsule 2 capsule (has no administration in time range)  bisacodyl (DULCOLAX) suppository 10 mg (0 mg Rectal Hold 09/28/23 0712)  LORazepam (ATIVAN) tablet 0.5 mg (has no administration in time range)  lactated ringers bolus 1,000 mL (1,000 mLs Intravenous Bolus 09/27/23 2149)  ondansetron (ZOFRAN) injection 4 mg (4 mg Intravenous Given 09/27/23 2149)  morphine (PF) 4 MG/ML injection 4 mg (4 mg Intravenous Given 09/27/23 2149)  iohexol (OMNIPAQUE) 300 MG/ML solution 100 mL (100 mLs Intravenous Contrast Given 09/27/23 2207)  LORazepam (ATIVAN) injection 1 mg (1 mg Intravenous Given 09/28/23 0742)    Mobility walks with person assist

## 2023-09-28 NOTE — Plan of Care (Signed)
  Problem: Coping: Goal: Ability to adjust to condition or change in health will improve Outcome: Progressing   

## 2023-09-29 ENCOUNTER — Inpatient Hospital Stay (HOSPITAL_COMMUNITY): Payer: Self-pay | Admitting: Anesthesiology

## 2023-09-29 ENCOUNTER — Encounter (HOSPITAL_COMMUNITY)
Admission: EM | Disposition: A | Discharge status: Skilled Nursing Facility | Payer: Self-pay | Source: Home / Self Care | Attending: Internal Medicine

## 2023-09-29 ENCOUNTER — Inpatient Hospital Stay (HOSPITAL_COMMUNITY): Payer: PPO | Admitting: Anesthesiology

## 2023-09-29 ENCOUNTER — Encounter (HOSPITAL_COMMUNITY): Payer: Self-pay | Admitting: Family Medicine

## 2023-09-29 DIAGNOSIS — K649 Unspecified hemorrhoids: Secondary | ICD-10-CM

## 2023-09-29 DIAGNOSIS — K6389 Other specified diseases of intestine: Secondary | ICD-10-CM | POA: Diagnosis not present

## 2023-09-29 DIAGNOSIS — K529 Noninfective gastroenteritis and colitis, unspecified: Secondary | ICD-10-CM | POA: Diagnosis not present

## 2023-09-29 HISTORY — PX: BIOPSY: SHX5522

## 2023-09-29 HISTORY — PX: FLEXIBLE SIGMOIDOSCOPY: SHX5431

## 2023-09-29 LAB — CBC
HCT: 39.5 % (ref 36.0–46.0)
Hemoglobin: 13 g/dL (ref 12.0–15.0)
MCH: 30.8 pg (ref 26.0–34.0)
MCHC: 32.9 g/dL (ref 30.0–36.0)
MCV: 93.6 fL (ref 80.0–100.0)
Platelets: 512 10*3/uL — ABNORMAL HIGH (ref 150–400)
RBC: 4.22 MIL/uL (ref 3.87–5.11)
RDW: 12.1 % (ref 11.5–15.5)
WBC: 12 10*3/uL — ABNORMAL HIGH (ref 4.0–10.5)
nRBC: 0 % (ref 0.0–0.2)

## 2023-09-29 LAB — BASIC METABOLIC PANEL
Anion gap: 10 (ref 5–15)
BUN: 13 mg/dL (ref 8–23)
CO2: 26 mmol/L (ref 22–32)
Calcium: 9.1 mg/dL (ref 8.9–10.3)
Chloride: 99 mmol/L (ref 98–111)
Creatinine, Ser: 0.83 mg/dL (ref 0.44–1.00)
GFR, Estimated: >60 mL/min (ref >60)
Glucose, Bld: 113 mg/dL — ABNORMAL HIGH (ref 70–99)
Potassium: 3.9 mmol/L (ref 3.5–5.1)
Sodium: 135 mmol/L (ref 135–145)

## 2023-09-29 LAB — GLUCOSE, CAPILLARY
Glucose-Capillary: 104 mg/dL — ABNORMAL HIGH (ref 70–99)
Glucose-Capillary: 107 mg/dL — ABNORMAL HIGH (ref 70–99)
Glucose-Capillary: 109 mg/dL — ABNORMAL HIGH (ref 70–99)
Glucose-Capillary: 113 mg/dL — ABNORMAL HIGH (ref 70–99)
Glucose-Capillary: 134 mg/dL — ABNORMAL HIGH (ref 70–99)
Glucose-Capillary: 140 mg/dL — ABNORMAL HIGH (ref 70–99)

## 2023-09-29 LAB — CEA: CEA: 1.2 ng/mL (ref 0.0–4.7)

## 2023-09-29 LAB — CANCER ANTIGEN 19-9: CA 19-9: 13 U/mL (ref 0–35)

## 2023-09-29 SURGERY — SIGMOIDOSCOPY, FLEXIBLE
Anesthesia: Monitor Anesthesia Care

## 2023-09-29 MED ORDER — SODIUM CHLORIDE 0.9 % IV SOLN
INTRAVENOUS | Status: DC | PRN
Start: 2023-09-29 — End: 2023-09-29

## 2023-09-29 MED ORDER — PROPOFOL 10 MG/ML IV BOLUS
INTRAVENOUS | Status: DC | PRN
  Administered 2023-09-29: 30 mg via INTRAVENOUS

## 2023-09-29 MED ORDER — PROPOFOL 1000 MG/100ML IV EMUL
INTRAVENOUS | Status: AC
  Filled 2023-09-29: qty 100

## 2023-09-29 MED ORDER — PROPOFOL 500 MG/50ML IV EMUL
INTRAVENOUS | Status: DC | PRN
  Administered 2023-09-29: 65 ug/kg/min via INTRAVENOUS

## 2023-09-29 MED ORDER — ONDANSETRON HCL 4 MG/2ML IJ SOLN
INTRAMUSCULAR | Status: DC | PRN
Start: 2023-09-29 — End: 2023-09-29
  Administered 2023-09-29: 4 mg via INTRAVENOUS

## 2023-09-29 MED ORDER — LIDOCAINE HCL (CARDIAC) PF 100 MG/5ML IV SOSY
PREFILLED_SYRINGE | INTRAVENOUS | Status: DC | PRN
  Administered 2023-09-29: 40 mg via INTRAVENOUS

## 2023-09-29 MED ORDER — PROPOFOL 10 MG/ML IV BOLUS
INTRAVENOUS | Status: AC
  Filled 2023-09-29: qty 20

## 2023-09-29 NOTE — Plan of Care (Signed)
  Problem: Coping: Goal: Ability to adjust to condition or change in health will improve Outcome: Progressing   Problem: Metabolic: Goal: Ability to maintain appropriate glucose levels will improve Outcome: Progressing   Problem: Nutrition: Goal: Adequate nutrition will be maintained Outcome: Progressing

## 2023-09-29 NOTE — Op Note (Signed)
[facility] Patient Name: Elizabeth Odom Procedure Date: 09/29/2023 MRN: [id number] Attending MD: Kerin Salen , MD, [id number] Date of Birth: Feb 19, 1941 CSN: [id number] Age: 82 Admit Type: Inpatient Procedure:                Flexible Sigmoidoscopy Indications:              Abnormal CT of the GI tract, Generalized abdominal                            pain, Change in bowel habits Providers:                Kerin Salen, MD, Lorenza Evangelist, RN, Priscella Mann,                            Technician Referring MD:             [facility] Medicines:                Monitored Anesthesia Care Complications:            No immediate complications. Estimated blood loss:                            Minimal. Estimated Blood Loss:     Estimated blood loss was minimal. Procedure:                Pre-Anesthesia Assessment:                           - Prior to the procedure, a History and Physical                            was performed, and patient medications and                            allergies were reviewed. The patient's tolerance of                            previous anesthesia was also reviewed. The risks                            and benefits of the procedure and the sedation                            options and risks were discussed with the patient.                            All questions were answered, and informed consent                            was obtained. Prior Anticoagulants: The patient has                            taken no anticoagulant or antiplatelet agents. ASA  Grade Assessment: III - A patient with severe                            systemic disease. After reviewing the risks and                            benefits, the patient was deemed in satisfactory                            condition to undergo the procedure.                           After obtaining informed consent, the scope was                            passed  under direct vision. The [id number]                            ([id number] Olympus slim colonoscope was introduced                            through the anus and advanced to the the                            rectosigmoid junction. The [id number] ([id number])                            Olympus slim endoscope was introduced through the                            and advanced to the. The flexible sigmoidoscopy was                            extremely difficult due to poor bowel prep with                            stool present, a redundant colon, significant                            looping and a tortuous colon. The patient tolerated                            the procedure well. The quality of the bowel                            preparation was poor. Scope In: Scope Out: Findings:      Hemorrhoids were found on perianal exam.      Semi-solid and solid stool was found in the rectum and in the       recto-sigmoid colon, interfering with visualization.      A localized area of severely congested, erythematous and thickened folds       of the mucosa was found in the recto-sigmoid colon. Biopsies were taken       with a cold forceps for  histology.      An ultrathin colonoscope could not be advanced beyond the narrowed       segemtn, 15 cm from anal verge.      It was exchanged for an ultrathin endoscope, which still could not be       advanced beyond the narrowed rectosigmoid area.      After taking biopsies, the scope was withdrawn. Impression:               - Preparation of the colon was poor.                           - Hemorrhoids found on perianal exam.                           - Stool in the rectum and in the recto-sigmoid                            colon.                           - Congested, erythematous and thickened folds of                            the mucosa in the recto-sigmoid colon. Biopsied.                           - Unable to advance scope beyond 15 cm from anal                             verge. Moderate Sedation:      Patient did not receive moderate sedation for this procedure, but       instead received monitored anesthesia care. Recommendation:           - Refer to a surgeon today.                           - Await pathology results. Procedure Code(s):        --- Professional ---                           [phone], Sigmoidoscopy, flexible; with biopsy, single                            or multiple Diagnosis Code(s):        --- Professional ---                           K64.9, Unspecified hemorrhoids                           K63.89, Other specified diseases of intestine                           R10.84, Generalized abdominal pain                           R19.4, Change in bowel habit  R93.3, Abnormal findings on diagnostic imaging of                            other parts of digestive tract CPT copyright 2022 [employer]. All rights reserved. The codes documented in this report are preliminary and upon coder review may  be revised to meet current compliance requirements. Kerin Salen, MD 09/29/2023 9:46:11 AM This report has been signed electronically. Number of Addenda: 0

## 2023-09-29 NOTE — Plan of Care (Signed)

## 2023-09-29 NOTE — Consult Note (Signed)
Reason for Consult: Sigmoid rectal stricture Referring Physician: Dr. Imelda Pillow JAID Elizabeth Odom is an 82 y.o. female.  HPI: Patient is an 82 year old female, with a history of A-fib, cervical cancer, hypertension, who comes in with abdominal pain.  States that she has had abdominal pain for approximately 9 days.  She states that she had fairly generalized abdominal pain.  She was on antibiotics for likely colitis.  She states that at that time she began having diarrhea.  She states that the pain became more intense so she re-presented to the ER.  Upon evaluation per CT scan patient was found to have a thickening of the rectosigmoid area.  Patient was having continued diarrhea.  States her last bowel movement was Thursday.  She states that she has been having more crampy abdominal pain.  Patient underwent sigmoidoscopy.  Unfortunately was not able to get past the area of concern.  Biopsies were taken.  Patient did have a leukocytosis.  Patient states she had a previous hysterectomy and appendectomy in the past.  I did review the patient's CT scan, laboratory studies and notes personally.  Past Medical History:  Diagnosis Date   A-fib (HCC) 04/09/2016   Acute head injury 04/09/2016   Cervical cancer (HCC)    Cervical spine fracture, initial encounter 04/09/2016   Essential hypertension    History of anemia    Multiple fractures of cervical spine (HCC) 04/09/2016   PAC (premature atrial contraction)    Restless leg    Syncope and collapse 04/09/2016    Past Surgical History:  Procedure Laterality Date   APPENDECTOMY     breast lump removal Left    CERVICAL LAMINECTOMY     complete hysterectomy     EP IMPLANTABLE DEVICE N/A 04/10/2016   Procedure: Loop Recorder Insertion;  Surgeon: Marinus Maw, MD;  Location: [facility];  Service: Cardiovascular;  Laterality: N/A;   HARVEST BONE GRAFT     LOOP RECORDER REMOVAL N/A 07/08/2018   Procedure: LOOP RECORDER REMOVAL;  Surgeon: Marinus Maw, MD;  Location: [facility];  Service: Cardiovascular;  Laterality: N/A;    Family History  Problem Relation Age of Onset   Other Mother        thrombosis   Cancer Sister    Diabetes Sister    Stroke Daughter    Thyroid disease Daughter     Social History:  reports that she has never smoked. She has never used smokeless tobacco. She reports that she does not drink alcohol and does not use drugs.  Allergies:  Allergies  Allergen Reactions   Elemental Sulfur Anaphylaxis    Medications: I have reviewed the patient's current medications.  Results for orders placed or performed during the hospital encounter of 09/27/23 (from the past 48 hour(s))  Lipase, blood     Status: None   Collection Time: 09/27/23  8:07 PM  Result Value Ref Range   Lipase 41 11 - 51 U/L    Comment: Performed at [facility], [address] [place], [place] [address]  Comprehensive metabolic panel     Status: Abnormal   Collection Time: 09/27/23  8:07 PM  Result Value Ref Range   Sodium 134 (L) 135 - 145 mmol/L   Potassium 3.6 3.5 - 5.1 mmol/L   Chloride 99 98 - 111 mmol/L   CO2 22 22 - 32 mmol/L   Glucose, Bld 184 (H) 70 - 99 mg/dL    Comment: Glucose reference  range applies only to samples taken after fasting for at least 8 hours.   BUN 16 8 - 23 mg/dL   Creatinine, Ser 0.27 0.44 - 1.00 mg/dL   Calcium 9.5 8.9 - 25.3 mg/dL   Total Protein 7.8 6.5 - 8.1 g/dL   Albumin 3.7 3.5 - 5.0 g/dL   AST 23 15 - 41 U/L   ALT 28 0 - 44 U/L   Alkaline Phosphatase 91 38 - 126 U/L   Total Bilirubin 0.6 0.3 - 1.2 mg/dL   GFR, Estimated >66 >44 mL/min    Comment: (NOTE) Calculated using the CKD-EPI Creatinine Equation (2021)    Anion gap 13 5 - 15    Comment: Performed at [facility], [address] [place], [place] [address]  CBC     Status: Abnormal   Collection Time: 09/27/23  8:07 PM  Result Value Ref Range   WBC 15.5 (H) 4.0 - 10.5 K/uL   RBC 4.74 3.87 -  5.11 MIL/uL   Hemoglobin 14.5 12.0 - 15.0 g/dL   HCT 25.9 56.3 - 87.5 %   MCV 90.5 80.0 - 100.0 fL   MCH 30.6 26.0 - 34.0 pg   MCHC 33.8 30.0 - 36.0 g/dL   RDW 64.3 32.9 - 51.8 %   Platelets 735 (H) 150 - 400 K/uL   nRBC 0.0 0.0 - 0.2 %    Comment: Performed at [facility], [address], [place], [place] [address]  Urinalysis, Routine w reflex microscopic -Urine, Clean Catch     Status: Abnormal   Collection Time: 09/27/23 10:05 PM  Result Value Ref Range   Color, Urine YELLOW YELLOW   APPearance HAZY (A) CLEAR   Specific Gravity, Urine 1.019 1.005 - 1.030   pH 5.0 5.0 - 8.0   Glucose, UA NEGATIVE NEGATIVE mg/dL   Hgb urine dipstick NEGATIVE NEGATIVE   Bilirubin Urine NEGATIVE NEGATIVE   Ketones, ur NEGATIVE NEGATIVE mg/dL   Protein, ur 30 (A) NEGATIVE mg/dL   Nitrite NEGATIVE NEGATIVE   Leukocytes,Ua TRACE (A) NEGATIVE   RBC / HPF 0-5 0 - 5 RBC/hpf   WBC, UA 0-5 0 - 5 WBC/hpf   Bacteria, UA NONE SEEN NONE SEEN   Squamous Epithelial / HPF 0-5 0 - 5 /HPF   Mucus PRESENT    Hyaline Casts, UA PRESENT     Comment: Performed at [facility], [address], [place], [place] [address]  CBG monitoring, ED     Status: Abnormal   Collection Time: 09/28/23 12:15 AM  Result Value Ref Range   Glucose-Capillary 137 (H) 70 - 99 mg/dL    Comment: Glucose reference range applies only to samples taken after fasting for at least 8 hours.  CBC     Status: Abnormal   Collection Time: 09/28/23 12:25 AM  Result Value Ref Range   WBC 14.0 (H) 4.0 - 10.5 K/uL   RBC 4.05 3.87 - 5.11 MIL/uL   Hemoglobin 12.5 12.0 - 15.0 g/dL   HCT 60.1 09.3 - 23.5 %   MCV 92.6 80.0 - 100.0 fL   MCH 30.9 26.0 - 34.0 pg   MCHC 33.3 30.0 - 36.0 g/dL   RDW 57.3 22.0 - 25.4 %   Platelets 542 (H) 150 - 400 K/uL   nRBC 0.0 0.0 - 0.2 %    Comment: Performed at [facility], [address] [place], [place] [address]  Creatinine, serum     Status:  None    Collection Time: 09/28/23 12:25 AM  Result Value Ref Range   Creatinine, Ser 0.86 0.44 - 1.00 mg/dL   GFR, Estimated >45 >40 mL/min    Comment: (NOTE) Calculated using the CKD-EPI Creatinine Equation (2021) Performed at [facility], [address] [place], [place] [address]   Hemoglobin A1c     Status: Abnormal   Collection Time: 09/28/23 12:25 AM  Result Value Ref Range   Hgb A1c MFr Bld 5.8 (H) 4.8 - 5.6 %    Comment: (NOTE) Pre diabetes:          5.7%-6.4%  Diabetes:              >6.4%  Glycemic control for   <7.0% adults with diabetes    Mean Plasma Glucose 119.76 mg/dL    Comment: Performed at [facility], [address] [place], [place] [address]  CEA     Status: None   Collection Time: 09/28/23  2:44 AM  Result Value Ref Range   CEA 1.2 0.0 - 4.7 ng/mL    Comment: (NOTE)                             Nonsmokers          <3.9                             Smokers             <5.6 [employer] Electrochemiluminescence Immunoassay (ECLIA) Values obtained with different assay methods or kits cannot be used interchangeably.  Results cannot be interpreted as absolute evidence of the presence or absence of malignant disease. Performed At: [facility] [address] [place], [place] [id number] Jolene Schimke MD VH:[id number]   CBG monitoring, ED     Status: Abnormal   Collection Time: 09/28/23  4:05 AM  Result Value Ref Range   Glucose-Capillary 125 (H) 70 - 99 mg/dL    Comment: Glucose reference range applies only to samples taken after fasting for at least 8 hours.  Basic metabolic panel     Status: Abnormal   Collection Time: 09/28/23  6:48 AM  Result Value Ref Range   Sodium 132 (L) 135 - 145 mmol/L   Potassium 3.3 (L) 3.5 - 5.1 mmol/L   Chloride 98 98 - 111 mmol/L   CO2 25 22 - 32 mmol/L   Glucose, Bld 120 (H) 70 - 99 mg/dL    Comment: Glucose reference range applies only to samples taken after fasting for at least 8  hours.   BUN 14 8 - 23 mg/dL   Creatinine, Ser 4.13 0.44 - 1.00 mg/dL   Calcium 8.5 (L) 8.9 - 10.3 mg/dL   GFR, Estimated >24 >40 mL/min    Comment: (NOTE) Calculated using the CKD-EPI Creatinine Equation (2021)    Anion gap 9 5 - 15    Comment: Performed at [facility], [address], [place], [place] [address]  CBC with Differential/Platelet     Status: Abnormal   Collection Time: 09/28/23  6:48 AM  Result Value Ref Range   WBC 13.5 (H) 4.0 - 10.5 K/uL   RBC 4.00 3.87 - 5.11 MIL/uL   Hemoglobin 12.5 12.0 - 15.0 g/dL   HCT 53.6 64.4 - 03.4 %   MCV 95.0 80.0 - 100.0 fL   MCH 31.3 26.0 -  34.0 pg   MCHC 32.9 30.0 - 36.0 g/dL   RDW 40.9 81.1 - 91.4 %   Platelets 515 (H) 150 - 400 K/uL   nRBC 0.0 0.0 - 0.2 %   Neutrophils Relative % 81 %   Neutro Abs 10.9 (H) 1.7 - 7.7 K/uL   Lymphocytes Relative 10 %   Lymphs Abs 1.4 0.7 - 4.0 K/uL   Monocytes Relative 7 %   Monocytes Absolute 0.9 0.1 - 1.0 K/uL   Eosinophils Relative 1 %   Eosinophils Absolute 0.1 0.0 - 0.5 K/uL   Basophils Relative 0 %   Basophils Absolute 0.1 0.0 - 0.1 K/uL   Immature Granulocytes 1 %   Abs Immature Granulocytes 0.18 (H) 0.00 - 0.07 K/uL    Comment: Performed at [facility], [address], [place], [place] [address]  Cancer antigen 19-9     Status: None   Collection Time: 09/28/23  6:48 AM  Result Value Ref Range   CA 19-9 13 0 - 35 U/mL    Comment: (NOTE) [employer] Electrochemiluminescence Immunoassay (ECLIA) Values obtained with different assay methods or kits cannot be used interchangeably.  Results cannot be interpreted as absolute evidence of the presence or absence of malignant disease. Performed At: [facility] [address] [place], [place] [id number] Jolene Schimke MD QI:[id number]   CBG monitoring, ED     Status: Abnormal   Collection Time: 09/28/23  7:39 AM  Result Value Ref Range   Glucose-Capillary 137 (H) 70 - 99 mg/dL     Comment: Glucose reference range applies only to samples taken after fasting for at least 8 hours.  Glucose, capillary     Status: Abnormal   Collection Time: 09/28/23 11:01 AM  Result Value Ref Range   Glucose-Capillary 110 (H) 70 - 99 mg/dL    Comment: Glucose reference range applies only to samples taken after fasting for at least 8 hours.  Glucose, capillary     Status: Abnormal   Collection Time: 09/28/23  4:41 PM  Result Value Ref Range   Glucose-Capillary 148 (H) 70 - 99 mg/dL    Comment: Glucose reference range applies only to samples taken after fasting for at least 8 hours.  Glucose, capillary     Status: Abnormal   Collection Time: 09/28/23  7:42 PM  Result Value Ref Range   Glucose-Capillary 115 (H) 70 - 99 mg/dL    Comment: Glucose reference range applies only to samples taken after fasting for at least 8 hours.  Glucose, capillary     Status: Abnormal   Collection Time: 09/28/23 11:36 PM  Result Value Ref Range   Glucose-Capillary 105 (H) 70 - 99 mg/dL    Comment: Glucose reference range applies only to samples taken after fasting for at least 8 hours.  Glucose, capillary     Status: Abnormal   Collection Time: 09/29/23  3:36 AM  Result Value Ref Range   Glucose-Capillary 109 (H) 70 - 99 mg/dL    Comment: Glucose reference range applies only to samples taken after fasting for at least 8 hours.  Basic metabolic panel     Status: Abnormal   Collection Time: 09/29/23  6:47 AM  Result Value Ref Range   Sodium 135 135 - 145 mmol/L   Potassium 3.9 3.5 - 5.1 mmol/L   Chloride 99 98 - 111 mmol/L   CO2 26 22 - 32 mmol/L   Glucose, Bld 113 (H) 70 - 99 mg/dL  Comment: Glucose reference range applies only to samples taken after fasting for at least 8 hours.   BUN 13 8 - 23 mg/dL   Creatinine, Ser 6.30 0.44 - 1.00 mg/dL   Calcium 9.1 8.9 - 16.0 mg/dL   GFR, Estimated >10 >93 mL/min    Comment: (NOTE) Calculated using the CKD-EPI Creatinine Equation (2021)    Anion gap 10  5 - 15    Comment: Performed at [facility] [place], [address], [place], [place] [address]  CBC     Status: Abnormal   Collection Time: 09/29/23  6:47 AM  Result Value Ref Range   WBC 12.0 (H) 4.0 - 10.5 K/uL   RBC 4.22 3.87 - 5.11 MIL/uL   Hemoglobin 13.0 12.0 - 15.0 g/dL   HCT 32.2 02.5 - 42.7 %   MCV 93.6 80.0 - 100.0 fL   MCH 30.8 26.0 - 34.0 pg   MCHC 32.9 30.0 - 36.0 g/dL   RDW 06.2 37.6 - 28.3 %   Platelets 512 (H) 150 - 400 K/uL   nRBC 0.0 0.0 - 0.2 %    Comment: Performed at [facility], [address], [place], [place] [address]  Glucose, capillary     Status: Abnormal   Collection Time: 09/29/23  8:34 AM  Result Value Ref Range   Glucose-Capillary 140 (H) 70 - 99 mg/dL    Comment: Glucose reference range applies only to samples taken after fasting for at least 8 hours.  Glucose, capillary     Status: Abnormal   Collection Time: 09/29/23 12:00 PM  Result Value Ref Range   Glucose-Capillary 134 (H) 70 - 99 mg/dL    Comment: Glucose reference range applies only to samples taken after fasting for at least 8 hours.    MR ABDOMEN W WO CONTRAST  Result Date: 09/28/2023 CLINICAL DATA:  9 mm pancreatic cystic lesion on CT. EXAM: MRI ABDOMEN WITHOUT AND WITH CONTRAST TECHNIQUE: Multiplanar multisequence MR imaging of the abdomen was performed both before and after the administration of intravenous contrast. CONTRAST:  5mL GADAVIST GADOBUTROL 1 MMOL/ML IV SOLN COMPARISON:  CT of 1 day prior. FINDINGS: Portions of exam are mild to moderately motion degraded. Lower chest: Mild cardiomegaly, without pericardial or pleural effusion. Hepatobiliary: Normal liver. Normal gallbladder, without biliary ductal dilatation. Pancreas: No main pancreatic duct dilatation. Corresponding the CT abnormality, within the pancreatic body/tail junction, is a cystic lesion which measures up to 9 mm on 19/24. No suspicious postcontrast characteristics including on 42/19. No  peripancreatic edema. Spleen:  Normal in size, without focal abnormality. Adrenals/Urinary Tract: Normal adrenal glands. Dominant lower pole right renal cyst at 6.3 cm does not warrant imaging follow-up. Normal left kidney. No hydronephrosis. Stomach/Bowel: Normal stomach and small bowel. Colonic obstruction as detailed on CT. Vascular/Lymphatic: Aortic atherosclerosis. No retroperitoneal or retrocrural adenopathy. Other:  No ascites. Musculoskeletal: No acute osseous abnormality. IMPRESSION: 1. Mild to moderately motion degraded exam. 2. 9 mm cystic lesion within the pancreatic body/tail junction with differential considerations of pseudocyst or indolent cystic neoplasm. Per consensus criteria, this warrants follow-up with pre and post contrast abdominal MRI at 1 year. This recommendation follows ACR consensus guidelines: Management of Incidental Pancreatic Cysts: A White Paper of the ACR Incidental Findings Committee. J Am Coll Radiol 2017;14:911-923. 3.  Aortic Atherosclerosis (ICD10-I70.0). 4. Colonic obstruction, as on CT. Electronically Signed   By: Jeronimo Greaves M.D.   On: 09/28/2023 08:52   MR 3D Recon At Scanner  Result Date:  09/28/2023 CLINICAL DATA:  9 mm pancreatic cystic lesion on CT. EXAM: MRI ABDOMEN WITHOUT AND WITH CONTRAST TECHNIQUE: Multiplanar multisequence MR imaging of the abdomen was performed both before and after the administration of intravenous contrast. CONTRAST:  5mL GADAVIST GADOBUTROL 1 MMOL/ML IV SOLN COMPARISON:  CT of 1 day prior. FINDINGS: Portions of exam are mild to moderately motion degraded. Lower chest: Mild cardiomegaly, without pericardial or pleural effusion. Hepatobiliary: Normal liver. Normal gallbladder, without biliary ductal dilatation. Pancreas: No main pancreatic duct dilatation. Corresponding the CT abnormality, within the pancreatic body/tail junction, is a cystic lesion which measures up to 9 mm on 19/24. No suspicious postcontrast characteristics including on  42/19. No peripancreatic edema. Spleen:  Normal in size, without focal abnormality. Adrenals/Urinary Tract: Normal adrenal glands. Dominant lower pole right renal cyst at 6.3 cm does not warrant imaging follow-up. Normal left kidney. No hydronephrosis. Stomach/Bowel: Normal stomach and small bowel. Colonic obstruction as detailed on CT. Vascular/Lymphatic: Aortic atherosclerosis. No retroperitoneal or retrocrural adenopathy. Other:  No ascites. Musculoskeletal: No acute osseous abnormality. IMPRESSION: 1. Mild to moderately motion degraded exam. 2. 9 mm cystic lesion within the pancreatic body/tail junction with differential considerations of pseudocyst or indolent cystic neoplasm. Per consensus criteria, this warrants follow-up with pre and post contrast abdominal MRI at 1 year. This recommendation follows ACR consensus guidelines: Management of Incidental Pancreatic Cysts: A White Paper of the ACR Incidental Findings Committee. J Am Coll Radiol 2017;14:911-923. 3.  Aortic Atherosclerosis (ICD10-I70.0). 4. Colonic obstruction, as on CT. Electronically Signed   By: Jeronimo Greaves M.D.   On: 09/28/2023 08:52   CT ABDOMEN PELVIS W CONTRAST  Result Date: 09/27/2023 CLINICAL DATA:  Acute abdominal pain. EXAM: CT ABDOMEN AND PELVIS WITH CONTRAST TECHNIQUE: Multidetector CT imaging of the abdomen and pelvis was performed using the standard protocol following bolus administration of intravenous contrast. RADIATION DOSE REDUCTION: This exam was performed according to the departmental dose-optimization program which includes automated exposure control, adjustment of the mA and/or kV according to patient size and/or use of iterative reconstruction technique. CONTRAST:  OMNIPAQUE IOHEXOL 300 MG/ML  SOLN COMPARISON:  CT abdomen and pelvis 09/20/2023 FINDINGS: Lower chest: No acute abnormality. Hepatobiliary: There is diffuse fatty infiltration of the liver. Gallbladder and bile ducts are within normal limits. Pancreas:  There is a rounded hypodensity in the tail of the pancreas measuring 9 mm which appears unchanged. No pancreatic ductal dilatation or surrounding inflammatory changes. Spleen: Normal in size without focal abnormality. Adrenals/Urinary Tract: Mildly complex right renal cyst is again seen with thin peripheral septation. This measures 6.9 cm and appears unchanged. Otherwise, the bilateral kidneys, adrenal gland glands, and bladder are within normal limits. Stomach/Bowel: There is wall thickening of the short segment of the proximal sigmoid colon. Upstream to this level of the colon is dilated containing air-fluid levels in the large amount of stool. The appendix is not seen. Small bowel loops and stomach are decompressed. Vascular/Lymphatic: Aortic atherosclerosis. No enlarged abdominal or pelvic lymph nodes. Reproductive: Status post hysterectomy. No adnexal masses. Other: There is a small amount of fluid in the pelvis. There is no focal abdominal wall hernia. There is no free air. Musculoskeletal: Subcentimeter lucent lesion in the L1 vertebral body, indeterminate. No acute fractures are seen. IMPRESSION: 1. Wall thickening of the proximal sigmoid colon worrisome for neoplasm. Focal colitis not excluded. There is upstream colonic dilatation with air-fluid levels and large amount of stool compatible with obstruction. 2. Small amount of free fluid in the pelvis. 3.  Fatty infiltration of the liver. 4. Stable 9 mm cystic lesion in the tail of the pancreas. This can be further evaluated with MRI. 5. Stable mildly complex right renal cyst. No follow-up imaging recommended. 6. Subcentimeter lucent lesion in the L1 vertebral body, indeterminate. Aortic Atherosclerosis (ICD10-I70.0). Electronically Signed   By: Darliss Cheney M.D.   On: 09/27/2023 22:27    Review of Systems  Constitutional:  Negative for chills and fever.  HENT:  Negative for ear discharge, hearing loss and sore throat.   Eyes:  Negative for discharge.   Respiratory:  Negative for cough and shortness of breath.   Cardiovascular:  Negative for chest pain and leg swelling.  Gastrointestinal:  Positive for abdominal pain and diarrhea. Negative for constipation, nausea and vomiting.  Musculoskeletal:  Negative for myalgias and neck pain.  Skin:  Negative for rash.  Allergic/Immunologic: Negative for environmental allergies.  Neurological:  Negative for dizziness and seizures.  Hematological:  Does not bruise/bleed easily.  Psychiatric/Behavioral:  Negative for suicidal ideas.   All other systems reviewed and are negative.  Blood pressure (!) 168/83, pulse 81, temperature 97.9 F (36.6 C), temperature source Temporal, resp. rate 20, height 5\' 6"  (1.676 m), weight 56.2 kg, SpO2 99%. Physical Exam Constitutional:      Appearance: She is well-developed.     Comments: Conversant No acute distress  HENT:     Head: Normocephalic and atraumatic.  Eyes:     General: Lids are normal. No scleral icterus.    Pupils: Pupils are equal, round, and reactive to light.     Comments: Pupils are equal round and reactive No lid lag Moist conjunctiva  Neck:     Thyroid: No thyromegaly.     Trachea: No tracheal tenderness.     Comments: No cervical lymphadenopathy Cardiovascular:     Rate and Rhythm: Normal rate and regular rhythm.     Heart sounds: No murmur heard. Pulmonary:     Effort: Pulmonary effort is normal.     Breath sounds: Normal breath sounds. No wheezing or rales.  Abdominal:     Tenderness: There is generalized abdominal tenderness.     Hernia: No hernia is present.  Musculoskeletal:     Cervical back: Normal range of motion and neck supple.  Skin:    General: Skin is warm.     Findings: No rash.     Nails: There is no clubbing.     Comments: Normal skin turgor  Neurological:     Mental Status: She is alert and oriented to person, place, and time.     Comments: Normal gait and station  Psychiatric:        Mood and Affect: Mood  normal.        Thought Content: Thought content normal.        Judgment: Judgment normal.     Comments: Appropriate affect     Assessment/Plan: 82 year old female with large bowel obstruction due to sigmoid rectal stricture. Hypertension History of cervical cancer Pancreatic tail cyst   1.  I had a long discussion with the patient and her family members.  Secondary to the fact that she has a near obstructing process in her rectosigmoid junction she will likely require ex lap with resection and colostomy.  I discussed with the patient this would be temporary and she will have this reversed in 3 to 4 months. I discussed with her that it is uncertain whether or not this is a tumor like a mass versus  possible colitis versus possible ischemic colitis.  I believe secondary to the fact that she has had some obstructive symptoms and appears to be obstructed on CT scan proceeding with surgery would be the correct next step.  Patient would benefit from CT scan of chest, staging.  Answered all questions to the patient and her family.  2.  Will plan on proceeding to the operating room for ex lap and sigmoid colon resection Monday morning unless patient has more concerning signs of peritonitis which she would require urgent treatment.  3.  Can hold off on NG tube placement at this point.  If she does have nausea and vomiting she would likely require NG tube to help prevent aspiration.  4.  Patient with pancreatic tail cyst.  I discussed this finding with Dr. Donell Beers.  At this point she would recommend continued follow-up in 1 year with her.  No plans for surgery at this point of the pancreatic cyst.    Axel Filler 09/29/2023, 1:50 PM

## 2023-09-29 NOTE — Progress Notes (Signed)
PROGRESS NOTE BAYLEIGH LASPISA  ZOX:[id number] DOB: 02/21/1941 DOA: 09/27/2023 PCP: [name], FNP  Brief Narrative/Hospital Course: 82 year old female with history of A-fib, RLS, fall risk, anemia who was seen in the ED with last week on 09/20/23 for abdominal pain Suprapubic, decreased appetite and also fever and in ED had CT done that showed colitis and has been on Augmentin until 2 days PTA and was also told to increase her MiraLAX, the nshe had mild symptomatic improvement on 10/16 but had had severe recurrence of her pain and intolerance to oral intake and 3 episodes of nonbilious and nonbloody diarrhea so return to the ED on 09/27/2023. Patient reported diarrhea since he had been on antibiotics initially suprapubic pain now all over unable to keep anything down due to nausea vomiting.  Last colonoscopy probably 10 years ago. In the ED: Vital stable labs showed leukocytosis 15.5. CT ABD W/ contrast>>Wall thickening of the proximal sigmoid colon worrisome for neoplasm. Focal colitis not excluded. There is upstream colonic dilatation with air-fluid levels and large amount of stool compatible with obstruction. Small amount of free fluid in the pelvis. Fatty infiltration of the liver.  9 cm cystic lesion in the tail of the pancreas.  Subcentimeter lucent lesion in the L1 vertebral body indeterminate. Patient is admitted for further management     Subjective: Seen and examined Family at bedside Patient underwent flex sigmoidoscopy this morning   Assessment and Plan: Principal Problem:   Acute colitis   Abdominal pain Possible acute colitis Diarrhea: Patient refused colonoscopy agreed for sigmoidoscopy> done 10/19 a.m. shows very tight structure versus mass in the rectosigmoid could not pass ultrathin colonoscope or even endoscope surgery consulted biopsies obtained but not sure if they will be adequate. Discussed NGT insertion per [facility] recommendation- she does not want now , wants to  discuss more with surgeon.Continue on empiric antibiotics IV fluid hydration follow-up stool studies. No stool to test yet.  9 mm Pancreatic cyst: Underwent MRI> In the pancreatic body/tail junction with differential considerations of pseudocyst or indolent cystic neoplasm. Per consensus criteria, this warrants follow-up with pre and post contrast abdominal MRI at 1 year.  Hyperglycemia: Continue sliding scale insulin blood sugar controlled.  Hyponatremia: Continue gentle IVF.  Hypokalemia: Resolved.  DVT prophylaxis: enoxaparin (LOVENOX) injection 40 mg Start: 09/28/23 1000 Code Status:   Code Status: Full Code Family Communication: plan of care discussed with patient/family at bedside. Patient status is: Inpatient because of colitis/abdominal pain Level of care: Med-Surg   Dispo: The patient is from: Home, independent            Anticipated disposition: tbd Objective: Vitals last 24 hrs: Vitals:   09/29/23 1000 09/29/23 1010 09/29/23 1020 09/29/23 1030  BP: (!) 136/52 (!) 150/42 (!) 154/55 (!) 168/83  Pulse: 74 78 68 81  Resp: 16 18 14 20   Temp:      TempSrc:      SpO2: 97% 98% 98% 99%  Weight:      Height:       Weight change:   Physical Examination: General exam: alert awake, oriented at baseline, older than stated age HEENT:Oral mucosa moist, Ear/Nose WNL grossly Respiratory system: Bilaterally clear BS,no use of accessory muscle Cardiovascular system: S1 & S2 +, No JVD. Gastrointestinal system: Abdomen soft tender on lweor abdomen, appears full  BS sluggish Nervous System: Alert, awake, moving all extremities,and following commands. Extremities: LE edema neg,distal peripheral pulses palpable and warm.  Skin: No rashes,no icterus. MSK: Normal muscle bulk,tone,  power   Medications reviewed:  Scheduled Meds:  acidophilus  2 capsule Oral TID   bisacodyl  10 mg Rectal Once   enoxaparin (LOVENOX) injection  40 mg Subcutaneous Q24H   insulin aspart  0-9 Units  Subcutaneous Q4H   sodium phosphate  1 enema Rectal Once  Continuous Infusions:  piperacillin-tazobactam 3.375 g (09/29/23 0403)    Diet Order             Diet NPO time specified Except for: Sips with Meds, Ice Chips  Diet effective midnight                  Intake/Output Summary (Last 24 hours) at 09/29/2023 1201 Last data filed at 09/29/2023 4098 Gross per 24 hour  Intake 940.05 ml  Output 250 ml  Net 690.05 ml   Net IO Since Admission: 690.05 mL [09/29/23 1201]  Wt Readings from Last 3 Encounters:  09/28/23 56.2 kg  09/20/23 56.2 kg  11/23/20 55.3 kg     Unresulted Labs (From admission, onward)     Start     Ordered   10/04/23 0500  Creatinine, serum  (enoxaparin (LOVENOX)    CrCl >/= 30 ml/min)  Weekly,   R     Comments: while on enoxaparin therapy    09/27/23 2335   09/28/23 1032  Gastrointestinal Panel by PCR , Stool  (Gastrointestinal Panel by PCR, Stool                                                                                                                                                     **Does Not include CLOSTRIDIUM DIFFICILE testing. **If CDIFF testing is needed, place order from the "C Difficile Testing" order set.**)  Once,   R        09/28/23 1031   09/28/23 1031  C Difficile Quick Screen w PCR reflex  (C Difficile quick screen w PCR reflex panel )  Once, for 24 hours,   TIMED       References:    CDiff Information Tool   09/28/23 1031          Data Reviewed: I have personally reviewed following labs and imaging studies CBC: Recent Labs  Lab 09/27/23 2007 09/28/23 0025 09/28/23 0648 09/29/23 0647  WBC 15.5* 14.0* 13.5* 12.0*  NEUTROABS  --   --  10.9*  --   HGB 14.5 12.5 12.5 13.0  HCT 42.9 37.5 38.0 39.5  MCV 90.5 92.6 95.0 93.6  PLT 735* 542* 515* 512*   Basic Metabolic Panel: Recent Labs  Lab 09/27/23 2007 09/28/23 0025 09/28/23 0648 09/29/23 0647  NA 134*  --  132* 135  K 3.6  --  3.3* 3.9  CL 99  --  98 99  CO2 22   --  25 26  GLUCOSE 184*  --  120* 113*  BUN 16  --  14 13  CREATININE 0.90 0.86 0.83 0.83  CALCIUM 9.5  --  8.5* 9.1   GFR: Estimated Creatinine Clearance: 46.4 mL/min (by C-G formula based on SCr of 0.83 mg/dL). Liver Function Tests: Recent Labs  Lab 09/27/23 2007  AST 23  ALT 28  ALKPHOS 91  BILITOT 0.6  PROT 7.8  ALBUMIN 3.7   Recent Labs  Lab 09/27/23 2007  LIPASE 41   Recent Labs    09/28/23 0025  HGBA1C 5.8*  CBG: Recent Labs  Lab 09/28/23 1942 09/28/23 2336 09/29/23 0336 09/29/23 0834 09/29/23 1200  GLUCAP 115* 105* 109* 140* 134*   Recent Results (from the past 240 hour(s))  Culture, blood (Routine x 2)     Status: None   Collection Time: 09/20/23  7:02 PM   Specimen: BLOOD  Result Value Ref Range Status   Specimen Description   Final    BLOOD RIGHT ANTECUBITAL Performed at [facility], [address], [place], [place] [address]    Special Requests   Final    BOTTLES DRAWN AEROBIC AND ANAEROBIC Blood Culture adequate volume Performed at [facility], [address], [place], [place] [address]    Culture   Final    NO GROWTH 5 DAYS Performed at [facility], [address] [place], [place] [address]    Report Status 09/25/2023 FINAL  Final  Resp panel by RT-PCR (RSV, Flu A&B, Covid) Anterior Nasal Swab     Status: None   Collection Time: 09/20/23  7:02 PM   Specimen: Anterior Nasal Swab  Result Value Ref Range Status   SARS Coronavirus 2 by RT PCR NEGATIVE NEGATIVE Final    Comment: (NOTE) SARS-CoV-2 target nucleic acids are NOT DETECTED.  The SARS-CoV-2 RNA is generally detectable in upper respiratory specimens during the acute phase of infection. The lowest concentration of SARS-CoV-2 viral copies this assay can detect is 138 copies/mL. A negative result does not preclude SARS-Cov-2 infection and should not be used as the sole basis for treatment or other patient management decisions. A  negative result may occur with  improper specimen collection/handling, submission of specimen other than nasopharyngeal swab, presence of viral mutation(s) within the areas targeted by this assay, and inadequate number of viral copies(<138 copies/mL). A negative result must be combined with clinical observations, patient history, and epidemiological information. The expected result is Negative.  Fact Sheet for Patients:  BloggerCourse.com  Fact Sheet for Healthcare Providers:  SeriousBroker.it  This test is no t yet approved or cleared by the [place] FDA and  has been authorized for detection and/or diagnosis of SARS-CoV-2 by FDA under an Emergency Use Authorization (EUA). This EUA will remain  in effect (meaning this test can be used) for the duration of the COVID-19 declaration under Section 564(b)(1) of the Act, 21 U.S.C.section 360bbb-3(b)(1), unless the authorization is terminated  or revoked sooner.       Influenza A by PCR NEGATIVE NEGATIVE Final   Influenza B by PCR NEGATIVE NEGATIVE Final    Comment: (NOTE) The Xpert Xpress SARS-CoV-2/FLU/RSV plus assay is intended as an aid in the diagnosis of influenza from Nasopharyngeal swab specimens and should not be used as a sole basis for treatment. Nasal washings and aspirates are unacceptable for Xpert Xpress SARS-CoV-2/FLU/RSV testing.  Fact Sheet for Patients: BloggerCourse.com  Fact Sheet for Healthcare Providers: SeriousBroker.it  This test is not yet approved or cleared by the [place] FDA and has been authorized for detection and/or diagnosis of SARS-CoV-2 by FDA under an Emergency Use Authorization (EUA). This EUA will remain in effect (meaning this test can be used) for the duration of the COVID-19 declaration under Section 564(b)(1) of the Act, 21 U.S.C. section 360bbb-3(b)(1), unless the authorization  is terminated or revoked.     Resp Syncytial Virus by PCR NEGATIVE NEGATIVE Final    Comment: (NOTE) Fact Sheet for Patients: BloggerCourse.com  Fact Sheet for Healthcare Providers: SeriousBroker.it  This test is not yet approved or cleared by the [place] FDA and has been authorized for detection and/or diagnosis of SARS-CoV-2 by FDA under an Emergency Use Authorization (EUA). This EUA will remain in effect (meaning this test can be used) for the duration of the COVID-19 declaration under Section 564(b)(1) of the Act, 21 U.S.C. section 360bbb-3(b)(1), unless the authorization is terminated or revoked.  Performed at [facility], [address], [place], [place] [address]   Culture, blood (Routine x 2)     Status: None   Collection Time: 09/20/23  7:30 PM   Specimen: BLOOD  Result Value Ref Range Status   Specimen Description   Final    BLOOD BLOOD RIGHT HAND Performed at [facility], [address] [place], [place] [address]    Special Requests   Final    BOTTLES DRAWN AEROBIC AND ANAEROBIC Blood Culture results may not be optimal due to an inadequate volume of blood received in culture bottles Performed at [facility], [address], [place], [place] [address]    Culture   Final    NO GROWTH 5 DAYS Performed at [facility], [address], [place], [place] [address]    Report Status 09/25/2023 FINAL  Final    Antimicrobials: Anti-infectives (From admission, onward)    Start     Dose/Rate Route Frequency Ordered Stop   09/27/23 2315  piperacillin-tazobactam (ZOSYN) IVPB 3.375 g        3.375 g 12.5 mL/hr over 240 Minutes Intravenous Every 8 hours 09/27/23 2305        Culture/Microbiology    Component Value Date/Time   SDES  09/20/2023 1930    BLOOD BLOOD RIGHT HAND Performed at [facility], [address]  [place], [place] [address]    SPECREQUEST  09/20/2023 1930    BOTTLES DRAWN AEROBIC AND ANAEROBIC Blood Culture results may not be optimal due to an inadequate volume of blood received in culture bottles Performed at [facility], [address], [place], [place] [address]    CULT  09/20/2023 1930    NO GROWTH 5 DAYS Performed at [facility], [address], [place], [place] [address]    REPTSTATUS 09/25/2023 FINAL 09/20/2023 1930  Radiology Studies: MR ABDOMEN W WO CONTRAST  Result Date: 09/28/2023 CLINICAL DATA:  9 mm pancreatic cystic lesion on CT. EXAM: MRI ABDOMEN WITHOUT AND WITH CONTRAST TECHNIQUE: Multiplanar multisequence MR imaging of the abdomen was performed both before and after the administration of intravenous contrast. CONTRAST:  5mL GADAVIST GADOBUTROL 1 MMOL/ML IV SOLN COMPARISON:  CT of 1 day prior. FINDINGS: Portions of exam are mild to moderately motion degraded. Lower chest: Mild cardiomegaly, without pericardial or pleural effusion. Hepatobiliary: Normal liver. Normal gallbladder, without biliary ductal dilatation. Pancreas: No main pancreatic duct dilatation. Corresponding the CT abnormality, within the pancreatic body/tail junction, is a cystic lesion which measures up to 9 mm on  19/24. No suspicious postcontrast characteristics including on 42/19. No peripancreatic edema. Spleen:  Normal in size, without focal abnormality. Adrenals/Urinary Tract: Normal adrenal glands. Dominant lower pole right renal cyst at 6.3 cm does not warrant imaging follow-up. Normal left kidney. No hydronephrosis. Stomach/Bowel: Normal stomach and small bowel. Colonic obstruction as detailed on CT. Vascular/Lymphatic: Aortic atherosclerosis. No retroperitoneal or retrocrural adenopathy. Other:  No ascites. Musculoskeletal: No acute osseous abnormality. IMPRESSION: 1. Mild to moderately motion degraded exam. 2. 9 mm cystic lesion within the pancreatic body/tail junction with  differential considerations of pseudocyst or indolent cystic neoplasm. Per consensus criteria, this warrants follow-up with pre and post contrast abdominal MRI at 1 year. This recommendation follows ACR consensus guidelines: Management of Incidental Pancreatic Cysts: A White Paper of the ACR Incidental Findings Committee. J Am Coll Radiol 2017;14:911-923. 3.  Aortic Atherosclerosis (ICD10-I70.0). 4. Colonic obstruction, as on CT. Electronically Signed   By: Jeronimo Greaves M.D.   On: 09/28/2023 08:52   MR 3D Recon At Scanner  Result Date: 09/28/2023 CLINICAL DATA:  9 mm pancreatic cystic lesion on CT. EXAM: MRI ABDOMEN WITHOUT AND WITH CONTRAST TECHNIQUE: Multiplanar multisequence MR imaging of the abdomen was performed both before and after the administration of intravenous contrast. CONTRAST:  5mL GADAVIST GADOBUTROL 1 MMOL/ML IV SOLN COMPARISON:  CT of 1 day prior. FINDINGS: Portions of exam are mild to moderately motion degraded. Lower chest: Mild cardiomegaly, without pericardial or pleural effusion. Hepatobiliary: Normal liver. Normal gallbladder, without biliary ductal dilatation. Pancreas: No main pancreatic duct dilatation. Corresponding the CT abnormality, within the pancreatic body/tail junction, is a cystic lesion which measures up to 9 mm on 19/24. No suspicious postcontrast characteristics including on 42/19. No peripancreatic edema. Spleen:  Normal in size, without focal abnormality. Adrenals/Urinary Tract: Normal adrenal glands. Dominant lower pole right renal cyst at 6.3 cm does not warrant imaging follow-up. Normal left kidney. No hydronephrosis. Stomach/Bowel: Normal stomach and small bowel. Colonic obstruction as detailed on CT. Vascular/Lymphatic: Aortic atherosclerosis. No retroperitoneal or retrocrural adenopathy. Other:  No ascites. Musculoskeletal: No acute osseous abnormality. IMPRESSION: 1. Mild to moderately motion degraded exam. 2. 9 mm cystic lesion within the pancreatic body/tail  junction with differential considerations of pseudocyst or indolent cystic neoplasm. Per consensus criteria, this warrants follow-up with pre and post contrast abdominal MRI at 1 year. This recommendation follows ACR consensus guidelines: Management of Incidental Pancreatic Cysts: A White Paper of the ACR Incidental Findings Committee. J Am Coll Radiol 2017;14:911-923. 3.  Aortic Atherosclerosis (ICD10-I70.0). 4. Colonic obstruction, as on CT. Electronically Signed   By: Jeronimo Greaves M.D.   On: 09/28/2023 08:52   CT ABDOMEN PELVIS W CONTRAST  Result Date: 09/27/2023 CLINICAL DATA:  Acute abdominal pain. EXAM: CT ABDOMEN AND PELVIS WITH CONTRAST TECHNIQUE: Multidetector CT imaging of the abdomen and pelvis was performed using the standard protocol following bolus administration of intravenous contrast. RADIATION DOSE REDUCTION: This exam was performed according to the departmental dose-optimization program which includes automated exposure control, adjustment of the mA and/or kV according to patient size and/or use of iterative reconstruction technique. CONTRAST:  OMNIPAQUE IOHEXOL 300 MG/ML  SOLN COMPARISON:  CT abdomen and pelvis 09/20/2023 FINDINGS: Lower chest: No acute abnormality. Hepatobiliary: There is diffuse fatty infiltration of the liver. Gallbladder and bile ducts are within normal limits. Pancreas: There is a rounded hypodensity in the tail of the pancreas measuring 9 mm which appears unchanged. No pancreatic ductal dilatation or surrounding inflammatory changes. Spleen: Normal in size without focal abnormality.  Adrenals/Urinary Tract: Mildly complex right renal cyst is again seen with thin peripheral septation. This measures 6.9 cm and appears unchanged. Otherwise, the bilateral kidneys, adrenal gland glands, and bladder are within normal limits. Stomach/Bowel: There is wall thickening of the short segment of the proximal sigmoid colon. Upstream to this level of the colon is dilated  containing air-fluid levels in the large amount of stool. The appendix is not seen. Small bowel loops and stomach are decompressed. Vascular/Lymphatic: Aortic atherosclerosis. No enlarged abdominal or pelvic lymph nodes. Reproductive: Status post hysterectomy. No adnexal masses. Other: There is a small amount of fluid in the pelvis. There is no focal abdominal wall hernia. There is no free air. Musculoskeletal: Subcentimeter lucent lesion in the L1 vertebral body, indeterminate. No acute fractures are seen. IMPRESSION: 1. Wall thickening of the proximal sigmoid colon worrisome for neoplasm. Focal colitis not excluded. There is upstream colonic dilatation with air-fluid levels and large amount of stool compatible with obstruction. 2. Small amount of free fluid in the pelvis. 3. Fatty infiltration of the liver. 4. Stable 9 mm cystic lesion in the tail of the pancreas. This can be further evaluated with MRI. 5. Stable mildly complex right renal cyst. No follow-up imaging recommended. 6. Subcentimeter lucent lesion in the L1 vertebral body, indeterminate. Aortic Atherosclerosis (ICD10-I70.0). Electronically Signed   By: Darliss Cheney M.D.   On: 09/27/2023 22:27     LOS: 2 days   Lanae Boast, MD [facility]  09/29/2023, 12:01 PM

## 2023-09-29 NOTE — Interval H&P Note (Signed)
History and Physical Interval Note: 81/female with abnormal CT imaging?simgoid mass vs stricture, abdominal pain, change in bowel habits for flexible sigmoidoscopy with propofol.  09/29/2023 9:01 AM  Elizabeth Odom  has presented today for flexible sigmoidoscopy with propofol, with the diagnosis of Abnormal CT imaging, change in bowel habits.  The various methods of treatment have been discussed with the patient and family. After consideration of risks, benefits and other options for treatment, the patient has consented to  Procedure(s): FLEXIBLE SIGMOIDOSCOPY (N/A) as a surgical intervention.  The patient's history has been reviewed, patient examined, no change in status, stable for surgery.  I have reviewed the patient's chart and labs.  Questions were answered to the patient's satisfaction.     Kerin Salen

## 2023-09-29 NOTE — Transfer of Care (Signed)
Immediate Anesthesia Transfer of Care Note  Patient: Elizabeth Odom  Procedure(s) Performed: FLEXIBLE SIGMOIDOSCOPY  Patient Location: PACU and Endoscopy Unit  Anesthesia Type:MAC  Level of Consciousness: awake and patient cooperative  Airway & Oxygen Therapy: Patient Spontanous Breathing  Post-op Assessment: Report given to RN and Post -op Vital signs reviewed and stable  Post vital signs: Reviewed and stable  Last Vitals:  Vitals Value Taken Time  BP 106/49 09/29/23   0948  Temp 97.8 F 09/29/23   0948  Pulse 73 09/29/23   0948  Resp 20 09/29/23   0948  SpO2 99% 09/29/23   0948    Last Pain:  Vitals:   09/29/23 0900  TempSrc:   PainSc: 10-Worst pain ever      Patients Stated Pain Goal: 0 (09/28/23 1430)  Complications: No notable events documented.

## 2023-09-29 NOTE — Anesthesia Postprocedure Evaluation (Signed)
Anesthesia Post Note  Patient: Elizabeth Odom  Procedure(s) Performed: FLEXIBLE SIGMOIDOSCOPY     Patient location during evaluation: PACU Anesthesia Type: MAC Level of consciousness: awake and alert Pain management: pain level controlled Vital Signs Assessment: post-procedure vital signs reviewed and stable Respiratory status: spontaneous breathing, nonlabored ventilation and respiratory function stable Cardiovascular status: blood pressure returned to baseline Postop Assessment: no apparent nausea or vomiting Anesthetic complications: no   No notable events documented.  Last Vitals:  Vitals:   09/29/23 1020 09/29/23 1030  BP: (!) 154/55 (!) 168/83  Pulse: 68 81  Resp: 14 20  Temp:    SpO2: 98% 99%    Last Pain:  Vitals:   09/29/23 1030  TempSrc:   PainSc: 2                  Shanda Howells

## 2023-09-30 ENCOUNTER — Inpatient Hospital Stay (HOSPITAL_COMMUNITY): Payer: PPO

## 2023-09-30 ENCOUNTER — Encounter (HOSPITAL_COMMUNITY): Payer: Self-pay | Admitting: Family Medicine

## 2023-09-30 DIAGNOSIS — K529 Noninfective gastroenteritis and colitis, unspecified: Secondary | ICD-10-CM | POA: Diagnosis not present

## 2023-09-30 LAB — GLUCOSE, CAPILLARY
Glucose-Capillary: 105 mg/dL — ABNORMAL HIGH (ref 70–99)
Glucose-Capillary: 113 mg/dL — ABNORMAL HIGH (ref 70–99)
Glucose-Capillary: 115 mg/dL — ABNORMAL HIGH (ref 70–99)
Glucose-Capillary: 89 mg/dL (ref 70–99)
Glucose-Capillary: 94 mg/dL (ref 70–99)
Glucose-Capillary: 98 mg/dL (ref 70–99)

## 2023-09-30 MED ORDER — KCL IN DEXTROSE-NACL 10-5-0.45 MEQ/L-%-% IV SOLN
INTRAVENOUS | Status: AC
  Filled 2023-09-30: qty 1000

## 2023-09-30 MED ORDER — IOHEXOL 300 MG/ML  SOLN
75.0000 mL | Freq: Once | INTRAMUSCULAR | Status: AC | PRN
Start: 2023-09-30 — End: 2023-09-30
  Administered 2023-09-30: 75 mL via INTRAVENOUS

## 2023-09-30 NOTE — Progress Notes (Signed)
PROGRESS NOTE Elizabeth Odom  MWU:[id number] DOB: [birth year] DOA: 09/27/2023 PCP: [name], FNP  Brief Narrative/Hospital Course: 82 year old female with history of A-fib, RLS, fall risk, anemia who was seen in the ED with last week on 09/20/23 for abdominal pain Suprapubic, decreased appetite and also fever and in ED had CT done that showed colitis and has been on Augmentin until 2 days PTA and was also told to increase her MiraLAX, the nshe had mild symptomatic improvement on 10/16 but had had severe recurrence of her pain and intolerance to oral intake and 3 episodes of nonbilious and nonbloody diarrhea so return to the ED on 09/27/2023. Patient reported diarrhea since he had been on antibiotics initially suprapubic pain now all over unable to keep anything down due to nausea vomiting.  Last colonoscopy probably 10 years ago. In the ED: Vital stable labs showed leukocytosis 15.5. CT ABD W/ contrast>>Wall thickening of the proximal sigmoid colon worrisome for neoplasm. Focal colitis not excluded. There is upstream colonic dilatation with air-fluid levels and large amount of stool compatible with obstruction. Small amount of free fluid in the pelvis. Fatty infiltration of the liver.  9 cm cystic lesion in the tail of the pancreas.  Subcentimeter lucent lesion in the L1 vertebral body indeterminate. Seen by GI- refused colonoscopy agreed for sigmoidoscopy> done 09/29/23>shows very tight structure versus mass in the rectosigmoid could not pass ultrathin colonoscope or even endoscope,  surgery consulted,  biopsies obtained but not sure if they will be adequate > CCS discussed over of uncertain situation tumor like a mass versus possible colitis versus possible ischemic colitis and after extensive discussion with patient and patient's family proceeding with surgery based on obstructive symptoms and finding.    Subjective: Patient seen and examined this morning Resting comfortably although having  abdominal discomfort and distention No bowel movement, but  passing minimal flatus Remained afebrile BP stable  On Zosyn IV   Assessment and Plan: Principal Problem:   Acute colitis   Abdominal pain Large bowel obstruction due to rect0-sigmoid/sigmoid stricture ? inflammatory versus mass Possible acute colitis and diarrhea: Seen by GI- refused colonoscopy agreed for sigmoidoscopy> done 09/29/23>shows very tight structure versus mass in the rectosigmoid could not pass ultrathin colonoscope or even endoscope,  surgery consulted,  biopsies obtained but not sure if they will be adequate > CCS discussed over of uncertain situation tumor like a mass versus possible colitis versus possible ischemic colitis and after extensive discussion with patient and patient's family proceeding with surgery based on obstructive symptoms and finding.  Holding off on NG tube unless have active nausea vomiting.Cont empiric Zosyn.add gentle ivf as npo. CT chest ordered for staging.  No diarrhea for stool analysis  9 mm Pancreatic cyst: S/p MRI>showed pancreatic cyst in the pancreatic body/tail junction with differential considerations of pseudocyst or indolent cystic neoplasm. Per consensus criteria, this warrants follow-up with pre and post contrast abdominal MRI at 1 year.  Hyperglycemia Prediabetes with A1c 5.8: Stable keep Accu-Chek  monitoring   Hyponatremia: Continue gentle IVF.  Hypokalemia: Resolved.  DVT prophylaxis: enoxaparin (LOVENOX) injection 40 mg Start: 09/28/23 1000 Code Status:   Code Status: Full Code Family Communication: plan of care discussed with patient/family at bedside. Patient status is: Inpatient because of colitis/abdominal pain Level of care: Med-Surg   Dispo: The patient is from: Home, independent            Anticipated disposition: tbd Objective: Vitals last 24 hrs: Vitals:   09/29/23 1030 09/29/23 1422  09/29/23 2046 09/30/23 0435  BP: (!) 168/83 (!) 140/63 123/67  123/64  Pulse: 81 75 76 62  Resp: 20 16 16 16   Temp:  98.2 F (36.8 C) 98.4 F (36.9 C) 98.3 F (36.8 C)  TempSrc:  Oral  Oral  SpO2: 99% 98% 98% 94%  Weight:      Height:       Weight change:   Physical Examination: General exam: alert awake, oriented at baseline, older than stated age HEENT:Oral mucosa moist, Ear/Nose WNL grossly Respiratory system: Bilaterally clear BS,no use of accessory muscle Cardiovascular system: S1 & S2 +, No JVD. Gastrointestinal system: Abdomen soft, tender in lower abdomen, full, BS hypoactive Nervous System: Alert, awake, moving all extremities,and following commands. Extremities: LE edema neg,distal peripheral pulses palpable and warm.  Skin: No rashes,no icterus. MSK: Normal muscle bulk,tone, power    Medications reviewed:  Scheduled Meds:  acidophilus  2 capsule Oral TID   bisacodyl  10 mg Rectal Once   enoxaparin (LOVENOX) injection  40 mg Subcutaneous Q24H   insulin aspart  0-9 Units Subcutaneous Q4H   sodium phosphate  1 enema Rectal Once  Continuous Infusions:  dextrose 5 % and 0.45 % NaCl with KCl 10 mEq/L 40 mL/hr at 09/30/23 0953   piperacillin-tazobactam 3.375 g (09/30/23 1222)    Diet Order             Diet NPO time specified Except for: Sips with Meds, Ice Chips  Diet effective midnight                  Intake/Output Summary (Last 24 hours) at 09/30/2023 1423 Last data filed at 09/30/2023 1043 Gross per 24 hour  Intake 197.84 ml  Output --  Net 197.84 ml   Net IO Since Admission: 887.89 mL [09/30/23 1423]  Wt Readings from Last 3 Encounters:  09/28/23 56.2 kg  09/20/23 56.2 kg  11/23/20 55.3 kg     Unresulted Labs (From admission, onward)     Start     Ordered   10/04/23 0500  Creatinine, serum  (enoxaparin (LOVENOX)    CrCl >/= 30 ml/min)  Weekly,   R     Comments: while on enoxaparin therapy    09/27/23 2335   10/01/23 0500  Basic metabolic panel  Daily,   R      09/30/23 0825   10/01/23 0500  CBC   Daily,   R      09/30/23 0825   09/28/23 1031  C Difficile Quick Screen w PCR reflex  (C Difficile quick screen w PCR reflex panel )  Once, for 24 hours,   TIMED       References:    CDiff Information Tool   09/28/23 1031          Data Reviewed: I have personally reviewed following labs and imaging studies CBC: Recent Labs  Lab 09/27/23 2007 09/28/23 0025 09/28/23 0648 09/29/23 0647  WBC 15.5* 14.0* 13.5* 12.0*  NEUTROABS  --   --  10.9*  --   HGB 14.5 12.5 12.5 13.0  HCT 42.9 37.5 38.0 39.5  MCV 90.5 92.6 95.0 93.6  PLT 735* 542* 515* 512*   Basic Metabolic Panel: Recent Labs  Lab 09/27/23 2007 09/28/23 0025 09/28/23 0648 09/29/23 0647  NA 134*  --  132* 135  K 3.6  --  3.3* 3.9  CL 99  --  98 99  CO2 22  --  25 26  GLUCOSE 184*  --  120* 113*  BUN 16  --  14 13  CREATININE 0.90 0.86 0.83 0.83  CALCIUM 9.5  --  8.5* 9.1   GFR: Estimated Creatinine Clearance: 46.4 mL/min (by C-G formula based on SCr of 0.83 mg/dL). Liver Function Tests: Recent Labs  Lab 09/27/23 2007  AST 23  ALT 28  ALKPHOS 91  BILITOT 0.6  PROT 7.8  ALBUMIN 3.7   Recent Labs  Lab 09/27/23 2007  LIPASE 41   Recent Labs    09/28/23 0025  HGBA1C 5.8*  CBG: Recent Labs  Lab 09/29/23 1942 09/29/23 2352 09/30/23 0357 09/30/23 0755 09/30/23 1159  GLUCAP 113* 104* 94 113* 115*   Recent Results (from the past 240 hour(s))  Culture, blood (Routine x 2)     Status: None   Collection Time: 09/20/23  7:02 PM   Specimen: BLOOD  Result Value Ref Range Status   Specimen Description   Final    BLOOD RIGHT ANTECUBITAL Performed at [facility], [address], [place], [place] [address]    Special Requests   Final    BOTTLES DRAWN AEROBIC AND ANAEROBIC Blood Culture adequate volume Performed at [facility], [address] [place], [place] [address]    Culture   Final    NO GROWTH 5 DAYS Performed at [facility], [address], [place], [place] [address]    Report Status 09/25/2023 FINAL  Final  Resp panel by RT-PCR (RSV, Flu A&B, Covid) Anterior Nasal Swab     Status: None   Collection Time: 09/20/23  7:02 PM   Specimen: Anterior Nasal Swab  Result Value Ref Range Status   SARS Coronavirus 2 by RT PCR NEGATIVE NEGATIVE Final    Comment: (NOTE) SARS-CoV-2 target nucleic acids are NOT DETECTED.  The SARS-CoV-2 RNA is generally detectable in upper respiratory specimens during the acute phase of infection. The lowest concentration of SARS-CoV-2 viral copies this assay can detect is 138 copies/mL. A negative result does not preclude SARS-Cov-2 infection and should not be used as the sole basis for treatment or other patient management decisions. A negative result may occur with  improper specimen collection/handling, submission of specimen other than nasopharyngeal swab, presence of viral mutation(s) within the areas targeted by this assay, and inadequate number of viral copies(<138 copies/mL). A negative result must be combined with clinical observations, patient history, and epidemiological information. The expected result is Negative.  Fact Sheet for Patients:  BloggerCourse.com  Fact Sheet for Healthcare Providers:  SeriousBroker.it  This test is no t yet approved or cleared by the [place] FDA and  has been authorized for detection and/or diagnosis of SARS-CoV-2 by FDA under an Emergency Use Authorization (EUA). This EUA will remain  in effect (meaning this test can be used) for the duration of the COVID-19 declaration under Section 564(b)(1) of the Act, 21 U.S.C.section 360bbb-3(b)(1), unless the authorization is terminated  or revoked sooner.       Influenza A by PCR NEGATIVE NEGATIVE Final   Influenza B by PCR NEGATIVE NEGATIVE Final    Comment: (NOTE) The Xpert Xpress SARS-CoV-2/FLU/RSV plus assay is intended as an aid in the diagnosis of  influenza from Nasopharyngeal swab specimens and should not be used as a sole basis for treatment. Nasal washings and aspirates are unacceptable for Xpert Xpress SARS-CoV-2/FLU/RSV testing.  Fact Sheet for Patients: BloggerCourse.com  Fact Sheet for Healthcare Providers: SeriousBroker.it  This test is not yet approved or cleared by the  [place] [employer] trader and has been authorized for detection and/or diagnosis of SARS-CoV-2 by FDA under an TEFL teacher (EUA). This EUA will remain in effect (meaning this test can be used) for the duration of the COVID-19 declaration under Section 564(b)(1) of the Act, 21 U.S.C. section 360bbb-3(b)(1), unless the authorization is terminated or revoked.     Resp Syncytial Virus by PCR NEGATIVE NEGATIVE Final    Comment: (NOTE) Fact Sheet for Patients: BloggerCourse.com  Fact Sheet for Healthcare Providers: SeriousBroker.it  This test is not yet approved or cleared by the [place] FDA and has been authorized for detection and/or diagnosis of SARS-CoV-2 by FDA under an Emergency Use Authorization (EUA). This EUA will remain in effect (meaning this test can be used) for the duration of the COVID-19 declaration under Section 564(b)(1) of the Act, 21 U.S.C. section 360bbb-3(b)(1), unless the authorization is terminated or revoked.  Performed at [facility], [address], [place], [place] [address]   Culture, blood (Routine x 2)     Status: None   Collection Time: 09/20/23  7:30 PM   Specimen: BLOOD  Result Value Ref Range Status   Specimen Description   Final    BLOOD BLOOD RIGHT HAND Performed at [facility], [address], [place], [place] [address]    Special Requests   Final    BOTTLES DRAWN AEROBIC AND ANAEROBIC Blood Culture results may not be optimal due to an inadequate volume of  blood received in culture bottles Performed at [facility], [address], [place], [place] [address]    Culture   Final    NO GROWTH 5 DAYS Performed at [facility], [address], [place], [place] [address]    Report Status 09/25/2023 FINAL  Final    Antimicrobials: Anti-infectives (From admission, onward)    Start     Dose/Rate Route Frequency Ordered Stop   09/27/23 2315  piperacillin-tazobactam (ZOSYN) IVPB 3.375 g        3.375 g 12.5 mL/hr over 240 Minutes Intravenous Every 8 hours 09/27/23 2305        Culture/Microbiology    Component Value Date/Time   SDES  09/20/2023 1930    BLOOD BLOOD RIGHT HAND Performed at [facility], [address] [place], [place] [address]    SPECREQUEST  09/20/2023 1930    BOTTLES DRAWN AEROBIC AND ANAEROBIC Blood Culture results may not be optimal due to an inadequate volume of blood received in culture bottles Performed at [facility], [address] [place], [place] [address]    CULT  09/20/2023 1930    NO GROWTH 5 DAYS Performed at [facility], [address], [place], [place] [address]    REPTSTATUS 09/25/2023 FINAL 09/20/2023 1930  Radiology Studies: No results found.   LOS: 3 days   Lanae Boast, MD [facility]  09/30/2023, 2:23 PM

## 2023-09-30 NOTE — Anesthesia Preprocedure Evaluation (Signed)
Anesthesia Evaluation  Patient identified by MRN, date of birth, ID band Patient awake    Reviewed: Allergy & Precautions, Patient's Chart, lab work & pertinent test results  History of Anesthesia Complications (+) PONV, PROLONGED EMERGENCE and history of anesthetic complications  Airway Mallampati: II  TM Distance: >3 FB Neck ROM: Full    Dental  (+) Dental Advisory Given   Pulmonary neg pulmonary ROS   Pulmonary exam normal breath sounds clear to auscultation       Cardiovascular hypertension (no home meds), (-) angina (-) Past MI, (-) Cardiac Stents and (-) CABG + dysrhythmias (no home A/C, bigeminy) Atrial Fibrillation  Rhythm:Regular Rate:Normal  Echo 2017: LVEF 55-60%, normal valves    Neuro/Psych negative neurological ROS  negative psych ROS   GI/Hepatic Neg liver ROS,,,Large bowel obstruction due to recto-sigmoid/sigmoid stricture ? inflammatory versus mass. Possible acute colitis and diarrhea. Seen by GI- refused colonoscopy agreed for sigmoidoscopy> done 09/29/23>shows very tight structure versus mass in the rectosigmoid could not pass ultrathin colonoscope or even endoscope,  surgery consulted,  biopsies obtained but not sure if they will be adequate > CCS discussed over of uncertain situation tumor like a mass versus possible colitis versus possible ischemic colitis and after extensive discussion with patient and patient's family proceeding with surgery based on obstructive symptoms and finding.  Holding off on NG tube unless have active nausea vomiting    Endo/Other  diabetes (prediabetic a1c 5.8)    Renal/GU negative Renal ROS  negative genitourinary   Musculoskeletal negative musculoskeletal ROS (+)    Abdominal   Peds  Hematology negative hematology ROS (+)   Anesthesia Other Findings   Reproductive/Obstetrics negative OB ROS                              Anesthesia  Physical Anesthesia Plan  ASA: 3  Anesthesia Plan: General   Post-op Pain Management: Ofirmev IV (intra-op)*   Induction: Intravenous and Rapid sequence  PONV Risk Score and Plan: 4 or greater and Ondansetron, Dexamethasone and Treatment may vary due to age or medical condition  Airway Management Planned: Oral ETT  Additional Equipment: None  Intra-op Plan:   Post-operative Plan: Extubation in OR  Informed Consent: I have reviewed the patients History and Physical, chart, labs and discussed the procedure including the risks, benefits and alternatives for the proposed anesthesia with the patient or authorized representative who has indicated his/her understanding and acceptance.     Dental advisory given  Plan Discussed with: CRNA and Anesthesiologist  Anesthesia Plan Comments: (Risks of general anesthesia discussed including, but not limited to, sore throat, hoarse voice, chipped/damaged teeth, injury to vocal cords, nausea and vomiting, allergic reactions, POCD, lung infection, heart attack, stroke, and death. All questions answered. )        Anesthesia Quick Evaluation

## 2023-09-30 NOTE — Plan of Care (Signed)
  Problem: Metabolic: Goal: Ability to maintain appropriate glucose levels will improve Outcome: Progressing   Problem: Skin Integrity: Goal: Risk for impaired skin integrity will decrease Outcome: Progressing   Problem: Activity: Goal: Risk for activity intolerance will decrease Outcome: Progressing

## 2023-09-30 NOTE — H&P (View-Only) (Signed)
1 Day Post-Op   Subjective/Chief Complaint: Pt with no changes, some abd pain Passing some flatus   Objective: Vital signs in last 24 hours: Temp:  [97.9 F (36.6 C)-98.4 F (36.9 C)] 98.3 F (36.8 C) (10/20 0435) Pulse Rate:  [62-81] 62 (10/20 0435) Resp:  [14-28] 16 (10/20 0435) BP: (106-168)/(42-83) 123/64 (10/20 0435) SpO2:  [94 %-99 %] 94 % (10/20 0435) Last BM Date : 09/27/23  Intake/Output from previous day: 10/19 0701 - 10/20 0700 In: 347.8 [P.O.:30; I.V.:150; IV Piggyback:167.8] Out: -  Intake/Output this shift: No intake/output data recorded.  PE:  Constitutional: No acute distress, conversant, appears states age. Eyes: Anicteric sclerae, moist conjunctiva, no lid lag Lungs: Clear to auscultation bilaterally, normal respiratory effort CV: regular rate and rhythm, no murmurs, no peripheral edema, pedal pulses 2+ GI: Soft, no masses or hepatosplenomegaly, min-tender to palpation Skin: No rashes, palpation reveals normal turgor Psychiatric: appropriate judgment and insight, oriented to person, place, and time   Lab Results:  Recent Labs    09/28/23 0648 09/29/23 0647  WBC 13.5* 12.0*  HGB 12.5 13.0  HCT 38.0 39.5  PLT 515* 512*   BMET Recent Labs    09/28/23 0648 09/29/23 0647  NA 132* 135  K 3.3* 3.9  CL 98 99  CO2 25 26  GLUCOSE 120* 113*  BUN 14 13  CREATININE 0.83 0.83  CALCIUM 8.5* 9.1   PT/INR No results for input(s): "LABPROT", "INR" in the last 72 hours. ABG No results for input(s): "PHART", "HCO3" in the last 72 hours.  Invalid input(s): "PCO2", "PO2"  Studies/Results: MR ABDOMEN W WO CONTRAST  Result Date: 09/28/2023 CLINICAL DATA:  9 mm pancreatic cystic lesion on CT. EXAM: MRI ABDOMEN WITHOUT AND WITH CONTRAST TECHNIQUE: Multiplanar multisequence MR imaging of the abdomen was performed both before and after the administration of intravenous contrast. CONTRAST:  5mL GADAVIST GADOBUTROL 1 MMOL/ML IV SOLN COMPARISON:  CT of 1 day  prior. FINDINGS: Portions of exam are mild to moderately motion degraded. Lower chest: Mild cardiomegaly, without pericardial or pleural effusion. Hepatobiliary: Normal liver. Normal gallbladder, without biliary ductal dilatation. Pancreas: No main pancreatic duct dilatation. Corresponding the CT abnormality, within the pancreatic body/tail junction, is a cystic lesion which measures up to 9 mm on 19/24. No suspicious postcontrast characteristics including on 42/19. No peripancreatic edema. Spleen:  Normal in size, without focal abnormality. Adrenals/Urinary Tract: Normal adrenal glands. Dominant lower pole right renal cyst at 6.3 cm does not warrant imaging follow-up. Normal left kidney. No hydronephrosis. Stomach/Bowel: Normal stomach and small bowel. Colonic obstruction as detailed on CT. Vascular/Lymphatic: Aortic atherosclerosis. No retroperitoneal or retrocrural adenopathy. Other:  No ascites. Musculoskeletal: No acute osseous abnormality. IMPRESSION: 1. Mild to moderately motion degraded exam. 2. 9 mm cystic lesion within the pancreatic body/tail junction with differential considerations of pseudocyst or indolent cystic neoplasm. Per consensus criteria, this warrants follow-up with pre and post contrast abdominal MRI at 1 year. This recommendation follows ACR consensus guidelines: Management of Incidental Pancreatic Cysts: A White Paper of the ACR Incidental Findings Committee. J Am Coll Radiol 2017;14:911-923. 3.  Aortic Atherosclerosis (ICD10-I70.0). 4. Colonic obstruction, as on CT. Electronically Signed   By: Jeronimo Greaves M.D.   On: 09/28/2023 08:52   MR 3D Recon At Scanner  Result Date: 09/28/2023 CLINICAL DATA:  9 mm pancreatic cystic lesion on CT. EXAM: MRI ABDOMEN WITHOUT AND WITH CONTRAST TECHNIQUE: Multiplanar multisequence MR imaging of the abdomen was performed both before and after the administration of intravenous  contrast. CONTRAST:  5mL GADAVIST GADOBUTROL 1 MMOL/ML IV SOLN COMPARISON:   CT of 1 day prior. FINDINGS: Portions of exam are mild to moderately motion degraded. Lower chest: Mild cardiomegaly, without pericardial or pleural effusion. Hepatobiliary: Normal liver. Normal gallbladder, without biliary ductal dilatation. Pancreas: No main pancreatic duct dilatation. Corresponding the CT abnormality, within the pancreatic body/tail junction, is a cystic lesion which measures up to 9 mm on 19/24. No suspicious postcontrast characteristics including on 42/19. No peripancreatic edema. Spleen:  Normal in size, without focal abnormality. Adrenals/Urinary Tract: Normal adrenal glands. Dominant lower pole right renal cyst at 6.3 cm does not warrant imaging follow-up. Normal left kidney. No hydronephrosis. Stomach/Bowel: Normal stomach and small bowel. Colonic obstruction as detailed on CT. Vascular/Lymphatic: Aortic atherosclerosis. No retroperitoneal or retrocrural adenopathy. Other:  No ascites. Musculoskeletal: No acute osseous abnormality. IMPRESSION: 1. Mild to moderately motion degraded exam. 2. 9 mm cystic lesion within the pancreatic body/tail junction with differential considerations of pseudocyst or indolent cystic neoplasm. Per consensus criteria, this warrants follow-up with pre and post contrast abdominal MRI at 1 year. This recommendation follows ACR consensus guidelines: Management of Incidental Pancreatic Cysts: A White Paper of the ACR Incidental Findings Committee. J Am Coll Radiol 2017;14:911-923. 3.  Aortic Atherosclerosis (ICD10-I70.0). 4. Colonic obstruction, as on CT. Electronically Signed   By: Jeronimo Greaves M.D.   On: 09/28/2023 08:52    Anti-infectives: Anti-infectives (From admission, onward)    Start     Dose/Rate Route Frequency Ordered Stop   09/27/23 2315  piperacillin-tazobactam (ZOSYN) IVPB 3.375 g        3.375 g 12.5 mL/hr over 240 Minutes Intravenous Every 8 hours 09/27/23 2305         Assessment/Plan: 82 year old female with large bowel obstruction due  to sigmoid rectal stricture. Hypertension History of cervical cancer Pancreatic tail cyst     1.Will plan on Ex lap and hartman's resection with colostomy tomorrow. F/u  CT scan of chest, staging.   Answered all questions to the patient  2.  Can hold off on NG tube placement at this point.  If she does have nausea and vomiting she would likely require NG tube to help prevent aspiration.   3.  Patient with pancreatic tail cyst.  I discussed this finding with Dr. Donell Beers.  At this point she would recommend continued follow-up in 1 year with her.  No plans for surgery at this point of the pancreatic cyst.      LOS: 3 days    Axel Filler 09/30/2023

## 2023-09-30 NOTE — TOC CM/SW Note (Signed)
Transition of Care [facility]) - Inpatient Brief Assessment   Patient Details  Name: Elizabeth Odom MRN: [id number] Date of Birth: 29-Jul-1941  Transition of Care [facility]) CM/SW Contact:    Darleene Cleaver, LCSW Phone Number: 09/30/2023, 6:17 PM   Clinical Narrative:  Patient does not have any SDOH needs.  Plan to return back home once medically ready for discharge.  Transition of Care Asessment: Insurance and Status: Insurance coverage has been reviewed Patient has primary care physician: Yes Home environment has been reviewed: Yes Prior level of function:: Indep Prior/Current Home Services: No current home services Social Determinants of Health Reivew: SDOH reviewed no interventions necessary Readmission risk has been reviewed: Yes Transition of care needs: no transition of care needs at this time

## 2023-09-30 NOTE — Progress Notes (Signed)
1 Day Post-Op   Subjective/Chief Complaint: Pt with no changes, some abd pain Passing some flatus   Objective: Vital signs in last 24 hours: Temp:  [97.9 F (36.6 C)-98.4 F (36.9 C)] 98.3 F (36.8 C) (10/20 0435) Pulse Rate:  [62-81] 62 (10/20 0435) Resp:  [14-28] 16 (10/20 0435) BP: (106-168)/(42-83) 123/64 (10/20 0435) SpO2:  [94 %-99 %] 94 % (10/20 0435) Last BM Date : 09/27/23  Intake/Output from previous day: 10/19 0701 - 10/20 0700 In: 347.8 [P.O.:30; I.V.:150; IV Piggyback:167.8] Out: -  Intake/Output this shift: No intake/output data recorded.  PE:  Constitutional: No acute distress, conversant, appears states age. Eyes: Anicteric sclerae, moist conjunctiva, no lid lag Lungs: Clear to auscultation bilaterally, normal respiratory effort CV: regular rate and rhythm, no murmurs, no peripheral edema, pedal pulses 2+ GI: Soft, no masses or hepatosplenomegaly, min-tender to palpation Skin: No rashes, palpation reveals normal turgor Psychiatric: appropriate judgment and insight, oriented to person, place, and time   Lab Results:  Recent Labs    09/28/23 0648 09/29/23 0647  WBC 13.5* 12.0*  HGB 12.5 13.0  HCT 38.0 39.5  PLT 515* 512*   BMET Recent Labs    09/28/23 0648 09/29/23 0647  NA 132* 135  K 3.3* 3.9  CL 98 99  CO2 25 26  GLUCOSE 120* 113*  BUN 14 13  CREATININE 0.83 0.83  CALCIUM 8.5* 9.1   PT/INR No results for input(s): "LABPROT", "INR" in the last 72 hours. ABG No results for input(s): "PHART", "HCO3" in the last 72 hours.  Invalid input(s): "PCO2", "PO2"  Studies/Results: MR ABDOMEN W WO CONTRAST  Result Date: 09/28/2023 CLINICAL DATA:  9 mm pancreatic cystic lesion on CT. EXAM: MRI ABDOMEN WITHOUT AND WITH CONTRAST TECHNIQUE: Multiplanar multisequence MR imaging of the abdomen was performed both before and after the administration of intravenous contrast. CONTRAST:  5mL GADAVIST GADOBUTROL 1 MMOL/ML IV SOLN COMPARISON:  CT of 1 day  prior. FINDINGS: Portions of exam are mild to moderately motion degraded. Lower chest: Mild cardiomegaly, without pericardial or pleural effusion. Hepatobiliary: Normal liver. Normal gallbladder, without biliary ductal dilatation. Pancreas: No main pancreatic duct dilatation. Corresponding the CT abnormality, within the pancreatic body/tail junction, is a cystic lesion which measures up to 9 mm on 19/24. No suspicious postcontrast characteristics including on 42/19. No peripancreatic edema. Spleen:  Normal in size, without focal abnormality. Adrenals/Urinary Tract: Normal adrenal glands. Dominant lower pole right renal cyst at 6.3 cm does not warrant imaging follow-up. Normal left kidney. No hydronephrosis. Stomach/Bowel: Normal stomach and small bowel. Colonic obstruction as detailed on CT. Vascular/Lymphatic: Aortic atherosclerosis. No retroperitoneal or retrocrural adenopathy. Other:  No ascites. Musculoskeletal: No acute osseous abnormality. IMPRESSION: 1. Mild to moderately motion degraded exam. 2. 9 mm cystic lesion within the pancreatic body/tail junction with differential considerations of pseudocyst or indolent cystic neoplasm. Per consensus criteria, this warrants follow-up with pre and post contrast abdominal MRI at 1 year. This recommendation follows ACR consensus guidelines: Management of Incidental Pancreatic Cysts: A White Paper of the ACR Incidental Findings Committee. J Am Coll Radiol 2017;14:911-923. 3.  Aortic Atherosclerosis (ICD10-I70.0). 4. Colonic obstruction, as on CT. Electronically Signed   By: Jeronimo Greaves M.D.   On: 09/28/2023 08:52   MR 3D Recon At Scanner  Result Date: 09/28/2023 CLINICAL DATA:  9 mm pancreatic cystic lesion on CT. EXAM: MRI ABDOMEN WITHOUT AND WITH CONTRAST TECHNIQUE: Multiplanar multisequence MR imaging of the abdomen was performed both before and after the administration of intravenous  contrast. CONTRAST:  5mL GADAVIST GADOBUTROL 1 MMOL/ML IV SOLN COMPARISON:   CT of 1 day prior. FINDINGS: Portions of exam are mild to moderately motion degraded. Lower chest: Mild cardiomegaly, without pericardial or pleural effusion. Hepatobiliary: Normal liver. Normal gallbladder, without biliary ductal dilatation. Pancreas: No main pancreatic duct dilatation. Corresponding the CT abnormality, within the pancreatic body/tail junction, is a cystic lesion which measures up to 9 mm on 19/24. No suspicious postcontrast characteristics including on 42/19. No peripancreatic edema. Spleen:  Normal in size, without focal abnormality. Adrenals/Urinary Tract: Normal adrenal glands. Dominant lower pole right renal cyst at 6.3 cm does not warrant imaging follow-up. Normal left kidney. No hydronephrosis. Stomach/Bowel: Normal stomach and small bowel. Colonic obstruction as detailed on CT. Vascular/Lymphatic: Aortic atherosclerosis. No retroperitoneal or retrocrural adenopathy. Other:  No ascites. Musculoskeletal: No acute osseous abnormality. IMPRESSION: 1. Mild to moderately motion degraded exam. 2. 9 mm cystic lesion within the pancreatic body/tail junction with differential considerations of pseudocyst or indolent cystic neoplasm. Per consensus criteria, this warrants follow-up with pre and post contrast abdominal MRI at 1 year. This recommendation follows ACR consensus guidelines: Management of Incidental Pancreatic Cysts: A White Paper of the ACR Incidental Findings Committee. J Am Coll Radiol 2017;14:911-923. 3.  Aortic Atherosclerosis (ICD10-I70.0). 4. Colonic obstruction, as on CT. Electronically Signed   By: Jeronimo Greaves M.D.   On: 09/28/2023 08:52    Anti-infectives: Anti-infectives (From admission, onward)    Start     Dose/Rate Route Frequency Ordered Stop   09/27/23 2315  piperacillin-tazobactam (ZOSYN) IVPB 3.375 g        3.375 g 12.5 mL/hr over 240 Minutes Intravenous Every 8 hours 09/27/23 2305         Assessment/Plan: 82 year old female with large bowel obstruction due  to sigmoid rectal stricture. Hypertension History of cervical cancer Pancreatic tail cyst     1.Will plan on Ex lap and hartman's resection with colostomy tomorrow. F/u  CT scan of chest, staging.   Answered all questions to the patient  2.  Can hold off on NG tube placement at this point.  If she does have nausea and vomiting she would likely require NG tube to help prevent aspiration.   3.  Patient with pancreatic tail cyst.  I discussed this finding with Dr. Donell Beers.  At this point she would recommend continued follow-up in 1 year with her.  No plans for surgery at this point of the pancreatic cyst.      LOS: 3 days    Axel Filler 09/30/2023

## 2023-09-30 NOTE — Progress Notes (Signed)
Subjective: Patient complains of abdominal discomfort.  She has not had a bowel movement but is passing minimal amount of flatus.  Objective: Vital signs in last 24 hours: Temp:  [98.2 F (36.8 C)-98.4 F (36.9 C)] 98.3 F (36.8 C) (10/20 0435) Pulse Rate:  [62-76] 62 (10/20 0435) Resp:  [16] 16 (10/20 0435) BP: (123-140)/(63-67) 123/64 (10/20 0435) SpO2:  [94 %-98 %] 94 % (10/20 0435) Weight change:  Last BM Date : 09/27/23  PE: Elderly, frail GENERAL: Not in distress  ABDOMEN: Distended abdomen, sluggish bowel sounds EXTREMITIES: No deformity  Lab Results: Results for orders placed or performed during the hospital encounter of 09/27/23 (from the past 48 hour(s))  Glucose, capillary     Status: Abnormal   Collection Time: 09/28/23  4:41 PM  Result Value Ref Range   Glucose-Capillary 148 (H) 70 - 99 mg/dL    Comment: Glucose reference range applies only to samples taken after fasting for at least 8 hours.  Glucose, capillary     Status: Abnormal   Collection Time: 09/28/23  7:42 PM  Result Value Ref Range   Glucose-Capillary 115 (H) 70 - 99 mg/dL    Comment: Glucose reference range applies only to samples taken after fasting for at least 8 hours.  Glucose, capillary     Status: Abnormal   Collection Time: 09/28/23 11:36 PM  Result Value Ref Range   Glucose-Capillary 105 (H) 70 - 99 mg/dL    Comment: Glucose reference range applies only to samples taken after fasting for at least 8 hours.  Glucose, capillary     Status: Abnormal   Collection Time: 09/29/23  3:36 AM  Result Value Ref Range   Glucose-Capillary 109 (H) 70 - 99 mg/dL    Comment: Glucose reference range applies only to samples taken after fasting for at least 8 hours.  Basic metabolic panel     Status: Abnormal   Collection Time: 09/29/23  6:47 AM  Result Value Ref Range   Sodium 135 135 - 145 mmol/L   Potassium 3.9 3.5 - 5.1 mmol/L   Chloride 99 98 - 111 mmol/L   CO2 26 22 - 32 mmol/L   Glucose, Bld 113  (H) 70 - 99 mg/dL    Comment: Glucose reference range applies only to samples taken after fasting for at least 8 hours.   BUN 13 8 - 23 mg/dL   Creatinine, Ser 1.61 0.44 - 1.00 mg/dL   Calcium 9.1 8.9 - 09.6 mg/dL   GFR, Estimated >04 >54 mL/min    Comment: (NOTE) Calculated using the CKD-EPI Creatinine Equation (2021)    Anion gap 10 5 - 15    Comment: Performed at [facility], [address], [place], [place] [address]  CBC     Status: Abnormal   Collection Time: 09/29/23  6:47 AM  Result Value Ref Range   WBC 12.0 (H) 4.0 - 10.5 K/uL   RBC 4.22 3.87 - 5.11 MIL/uL   Hemoglobin 13.0 12.0 - 15.0 g/dL   HCT 91.4 78.2 - 95.6 %   MCV 93.6 80.0 - 100.0 fL   MCH 30.8 26.0 - 34.0 pg   MCHC 32.9 30.0 - 36.0 g/dL   RDW 21.3 08.6 - 57.8 %   Platelets 512 (H) 150 - 400 K/uL   nRBC 0.0 0.0 - 0.2 %    Comment: Performed at [facility], [address], [place], [place] [address]  Glucose, capillary     Status: Abnormal  Collection Time: 09/29/23  8:34 AM  Result Value Ref Range   Glucose-Capillary 140 (H) 70 - 99 mg/dL    Comment: Glucose reference range applies only to samples taken after fasting for at least 8 hours.  Glucose, capillary     Status: Abnormal   Collection Time: 09/29/23 12:00 PM  Result Value Ref Range   Glucose-Capillary 134 (H) 70 - 99 mg/dL    Comment: Glucose reference range applies only to samples taken after fasting for at least 8 hours.  Glucose, capillary     Status: Abnormal   Collection Time: 09/29/23  4:47 PM  Result Value Ref Range   Glucose-Capillary 107 (H) 70 - 99 mg/dL    Comment: Glucose reference range applies only to samples taken after fasting for at least 8 hours.  Glucose, capillary     Status: Abnormal   Collection Time: 09/29/23  7:42 PM  Result Value Ref Range   Glucose-Capillary 113 (H) 70 - 99 mg/dL    Comment: Glucose reference range applies only to samples taken after fasting for at least 8 hours.   Glucose, capillary     Status: Abnormal   Collection Time: 09/29/23 11:52 PM  Result Value Ref Range   Glucose-Capillary 104 (H) 70 - 99 mg/dL    Comment: Glucose reference range applies only to samples taken after fasting for at least 8 hours.  Glucose, capillary     Status: None   Collection Time: 09/30/23  3:57 AM  Result Value Ref Range   Glucose-Capillary 94 70 - 99 mg/dL    Comment: Glucose reference range applies only to samples taken after fasting for at least 8 hours.  Glucose, capillary     Status: Abnormal   Collection Time: 09/30/23  7:55 AM  Result Value Ref Range   Glucose-Capillary 113 (H) 70 - 99 mg/dL    Comment: Glucose reference range applies only to samples taken after fasting for at least 8 hours.    Studies/Results: No results found.  Medications: I have reviewed the patient's current medications.  Assessment: Rectosigmoid/sigmoid stricture ?  Inflammation versus mass Unable to pass ultrathin colonoscope(9.7 mm in diameter) or even ultrathin endoscope(5.4 mm in diameter)  Plan: Exploratory laparotomy and Hartman's resection with colostomy being planned with surgery tomorrow. She remains n.p.o.. CT chest has been ordered to complete workup. I have taken biopsies from narrowed segment, however I was never able to advance the scope up to or beyond the stricture. Will follow from the periphery.  Kerin Salen, MD 09/30/2023, 11:37 AM

## 2023-10-01 ENCOUNTER — Other Ambulatory Visit: Payer: Self-pay

## 2023-10-01 ENCOUNTER — Encounter (HOSPITAL_COMMUNITY)
Admission: EM | Disposition: A | Discharge status: Skilled Nursing Facility | Payer: Self-pay | Source: Home / Self Care | Attending: Internal Medicine

## 2023-10-01 ENCOUNTER — Inpatient Hospital Stay (HOSPITAL_COMMUNITY): Payer: PPO | Admitting: Anesthesiology

## 2023-10-01 ENCOUNTER — Encounter (HOSPITAL_COMMUNITY): Payer: Self-pay | Admitting: Family Medicine

## 2023-10-01 ENCOUNTER — Inpatient Hospital Stay (HOSPITAL_COMMUNITY): Payer: PPO

## 2023-10-01 ENCOUNTER — Inpatient Hospital Stay (HOSPITAL_COMMUNITY): Payer: Self-pay | Admitting: Anesthesiology

## 2023-10-01 DIAGNOSIS — K56609 Unspecified intestinal obstruction, unspecified as to partial versus complete obstruction: Secondary | ICD-10-CM

## 2023-10-01 DIAGNOSIS — R079 Chest pain, unspecified: Secondary | ICD-10-CM

## 2023-10-01 DIAGNOSIS — K529 Noninfective gastroenteritis and colitis, unspecified: Secondary | ICD-10-CM | POA: Diagnosis not present

## 2023-10-01 HISTORY — PX: COLECTOMY WITH COLOSTOMY CREATION/HARTMANN PROCEDURE: SHX6598

## 2023-10-01 LAB — ECHOCARDIOGRAM COMPLETE
AR max vel: 2.68 cm2
AV Area VTI: 2.71 cm2
AV Area mean vel: 2.49 cm2
AV Mean grad: 3 mm[Hg]
AV Peak grad: 5.6 mm[Hg]
Ao pk vel: 1.18 m/s
Area-P 1/2: 3.72 cm2
Height: 66 in
S' Lateral: 2.1 cm
Weight: 1984 [oz_av]

## 2023-10-01 LAB — TYPE AND SCREEN
ABO/RH(D): A POS
Antibody Screen: NEGATIVE

## 2023-10-01 LAB — CBC
HCT: 40.3 % (ref 36.0–46.0)
Hemoglobin: 13.1 g/dL (ref 12.0–15.0)
MCH: 30.8 pg (ref 26.0–34.0)
MCHC: 32.5 g/dL (ref 30.0–36.0)
MCV: 94.8 fL (ref 80.0–100.0)
Platelets: 409 10*3/uL — ABNORMAL HIGH (ref 150–400)
RBC: 4.25 MIL/uL (ref 3.87–5.11)
RDW: 12.4 % (ref 11.5–15.5)
WBC: 11.3 10*3/uL — ABNORMAL HIGH (ref 4.0–10.5)
nRBC: 0 % (ref 0.0–0.2)

## 2023-10-01 LAB — GLUCOSE, CAPILLARY
Glucose-Capillary: 101 mg/dL — ABNORMAL HIGH (ref 70–99)
Glucose-Capillary: 166 mg/dL — ABNORMAL HIGH (ref 70–99)
Glucose-Capillary: 159 mg/dL — ABNORMAL HIGH (ref 70–99)
Glucose-Capillary: 140 mg/dL — ABNORMAL HIGH (ref 70–99)

## 2023-10-01 LAB — BASIC METABOLIC PANEL
Anion gap: 11 (ref 5–15)
BUN: 16 mg/dL (ref 8–23)
CO2: 25 mmol/L (ref 22–32)
Calcium: 8.7 mg/dL — ABNORMAL LOW (ref 8.9–10.3)
Chloride: 97 mmol/L — ABNORMAL LOW (ref 98–111)
Creatinine, Ser: 0.82 mg/dL (ref 0.44–1.00)
GFR, Estimated: >60 mL/min (ref >60)
Glucose, Bld: 109 mg/dL — ABNORMAL HIGH (ref 70–99)
Potassium: 3.9 mmol/L (ref 3.5–5.1)
Sodium: 133 mmol/L — ABNORMAL LOW (ref 135–145)

## 2023-10-01 LAB — ABO/RH: ABO/RH(D): A POS

## 2023-10-01 LAB — SURGICAL PCR SCREEN
MRSA, PCR: POSITIVE — AB
Staphylococcus aureus: POSITIVE — AB

## 2023-10-01 SURGERY — COLECTOMY, WITH COLOSTOMY CREATION
Anesthesia: General

## 2023-10-01 MED ORDER — LIDOCAINE HCL (CARDIAC) PF 100 MG/5ML IV SOSY
PREFILLED_SYRINGE | INTRAVENOUS | Status: DC | PRN
  Administered 2023-10-01: 60 mg via INTRAVENOUS

## 2023-10-01 MED ORDER — SUCCINYLCHOLINE CHLORIDE 200 MG/10ML IV SOSY
PREFILLED_SYRINGE | INTRAVENOUS | Status: DC | PRN
  Administered 2023-10-01: 80 mg via INTRAVENOUS

## 2023-10-01 MED ORDER — PROPOFOL 10 MG/ML IV BOLUS
INTRAVENOUS | Status: DC | PRN
  Administered 2023-10-01: 80 mg via INTRAVENOUS

## 2023-10-01 MED ORDER — KETOROLAC TROMETHAMINE 15 MG/ML IJ SOLN
15.0000 mg | Freq: Four times a day (QID) | INTRAMUSCULAR | Status: DC | PRN
  Administered 2023-10-01 – 2023-10-05 (×4): 15 mg via INTRAVENOUS
  Filled 2023-10-01 (×4): qty 1

## 2023-10-01 MED ORDER — AMISULPRIDE (ANTIEMETIC) 5 MG/2ML IV SOLN
INTRAVENOUS | Status: AC
  Filled 2023-10-01: qty 4

## 2023-10-01 MED ORDER — FENTANYL CITRATE PF 50 MCG/ML IJ SOSY
PREFILLED_SYRINGE | INTRAMUSCULAR | Status: AC
  Filled 2023-10-01: qty 3

## 2023-10-01 MED ORDER — ACETAMINOPHEN 10 MG/ML IV SOLN
INTRAVENOUS | Status: AC
  Filled 2023-10-01: qty 100

## 2023-10-01 MED ORDER — ORAL CARE MOUTH RINSE: 15.0000 mL | Freq: Once | OROMUCOSAL | Status: AC

## 2023-10-01 MED ORDER — ROCURONIUM BROMIDE 10 MG/ML (PF) SYRINGE
PREFILLED_SYRINGE | INTRAVENOUS | Status: AC
  Filled 2023-10-01: qty 10

## 2023-10-01 MED ORDER — CHLORHEXIDINE GLUCONATE CLOTH 2 % EX PADS
6.0000 | MEDICATED_PAD | Freq: Every day | CUTANEOUS | Status: AC
  Administered 2023-10-02 – 2023-10-05 (×3): 6 via TOPICAL

## 2023-10-01 MED ORDER — OXYCODONE HCL 5 MG PO TABS: 5.0000 mg | ORAL_TABLET | Freq: Once | ORAL | Status: DC | PRN

## 2023-10-01 MED ORDER — PHENYLEPHRINE HCL-NACL 20-0.9 MG/250ML-% IV SOLN
INTRAVENOUS | Status: AC
  Filled 2023-10-01: qty 250

## 2023-10-01 MED ORDER — AMISULPRIDE (ANTIEMETIC) 5 MG/2ML IV SOLN
10.0000 mg | Freq: Once | INTRAVENOUS | Status: AC | PRN
  Administered 2023-10-01: 10 mg via INTRAVENOUS

## 2023-10-01 MED ORDER — ALBUMIN HUMAN 5 % IV SOLN: INTRAVENOUS | Status: DC | PRN

## 2023-10-01 MED ORDER — CHLORHEXIDINE GLUCONATE 0.12 % MT SOLN
15.0000 mL | Freq: Once | OROMUCOSAL | Status: AC
  Administered 2023-10-01: 15 mL via OROMUCOSAL

## 2023-10-01 MED ORDER — LIDOCAINE HCL (PF) 2 % IJ SOLN
INTRAMUSCULAR | Status: AC
  Filled 2023-10-01: qty 5

## 2023-10-01 MED ORDER — MIDAZOLAM HCL 2 MG/2ML IJ SOLN
INTRAMUSCULAR | Status: AC
  Filled 2023-10-01: qty 2

## 2023-10-01 MED ORDER — SUGAMMADEX SODIUM 200 MG/2ML IV SOLN
INTRAVENOUS | Status: DC | PRN
  Administered 2023-10-01: 200 mg via INTRAVENOUS

## 2023-10-01 MED ORDER — DEXMEDETOMIDINE HCL IN NACL 80 MCG/20ML IV SOLN
INTRAVENOUS | Status: DC | PRN
  Administered 2023-10-01: 8 ug via INTRAVENOUS
  Administered 2023-10-01: 4 ug via INTRAVENOUS

## 2023-10-01 MED ORDER — ONDANSETRON HCL 4 MG/2ML IJ SOLN
INTRAMUSCULAR | Status: DC | PRN
  Administered 2023-10-01: 4 mg via INTRAVENOUS

## 2023-10-01 MED ORDER — PROPOFOL 500 MG/50ML IV EMUL
INTRAVENOUS | Status: AC
  Filled 2023-10-01: qty 50

## 2023-10-01 MED ORDER — FENTANYL CITRATE (PF) 100 MCG/2ML IJ SOLN
INTRAMUSCULAR | Status: DC | PRN
  Administered 2023-10-01: 50 ug via INTRAVENOUS
  Administered 2023-10-01: 25 ug via INTRAVENOUS
  Administered 2023-10-01 (×2): 50 ug via INTRAVENOUS
  Administered 2023-10-01: 25 ug via INTRAVENOUS
  Administered 2023-10-01: 50 ug via INTRAVENOUS

## 2023-10-01 MED ORDER — LIDOCAINE HCL (PF) 2 % IJ SOLN
INTRAMUSCULAR | Status: DC | PRN
  Administered 2023-10-01: 1.5 mg/kg/h via INTRADERMAL

## 2023-10-01 MED ORDER — LIDOCAINE HCL 2 % IJ SOLN
INTRAMUSCULAR | Status: AC
  Filled 2023-10-01: qty 20

## 2023-10-01 MED ORDER — PROPOFOL 500 MG/50ML IV EMUL
INTRAVENOUS | Status: DC | PRN
  Administered 2023-10-01: 20 ug/kg/min via INTRAVENOUS

## 2023-10-01 MED ORDER — ONDANSETRON HCL 4 MG/2ML IJ SOLN
INTRAMUSCULAR | Status: AC
  Filled 2023-10-01: qty 2

## 2023-10-01 MED ORDER — HEPARIN (PORCINE) 25000 UT/250ML-% IV SOLN
1100.0000 [IU]/h | INTRAVENOUS | Status: DC
  Administered 2023-10-01: 1000 [IU]/h via INTRAVENOUS
  Administered 2023-10-02 – 2023-10-04 (×3): 1100 [IU]/h via INTRAVENOUS
  Filled 2023-10-01 (×5): qty 250

## 2023-10-01 MED ORDER — PHENYLEPHRINE 80 MCG/ML (10ML) SYRINGE FOR IV PUSH (FOR BLOOD PRESSURE SUPPORT)
PREFILLED_SYRINGE | INTRAVENOUS | Status: AC
  Filled 2023-10-01: qty 10

## 2023-10-01 MED ORDER — ENOXAPARIN SODIUM 40 MG/0.4ML IJ SOSY: 40.0000 mg | PREFILLED_SYRINGE | INTRAMUSCULAR | Status: DC

## 2023-10-01 MED ORDER — PHENOL 1.4 % MT LIQD
1.0000 | OROMUCOSAL | Status: DC | PRN
  Administered 2023-10-01: 1 via OROMUCOSAL
  Filled 2023-10-01: qty 177

## 2023-10-01 MED ORDER — LACTATED RINGERS IV SOLN: INTRAVENOUS | Status: DC | PRN

## 2023-10-01 MED ORDER — SODIUM CHLORIDE 0.9 % IV SOLN: INTRAVENOUS | Status: DC | PRN

## 2023-10-01 MED ORDER — ROCURONIUM BROMIDE 100 MG/10ML IV SOLN
INTRAVENOUS | Status: DC | PRN
  Administered 2023-10-01: 10 mg via INTRAVENOUS
  Administered 2023-10-01: 20 mg via INTRAVENOUS
  Administered 2023-10-01: 30 mg via INTRAVENOUS
  Administered 2023-10-01: 20 mg via INTRAVENOUS

## 2023-10-01 MED ORDER — LACTATED RINGERS IV SOLN: INTRAVENOUS | Status: DC

## 2023-10-01 MED ORDER — FENTANYL CITRATE PF 50 MCG/ML IJ SOSY
25.0000 ug | PREFILLED_SYRINGE | INTRAMUSCULAR | Status: DC | PRN
  Administered 2023-10-01: 50 ug via INTRAVENOUS

## 2023-10-01 MED ORDER — 0.9 % SODIUM CHLORIDE (POUR BTL) OPTIME
TOPICAL | Status: DC | PRN
  Administered 2023-10-01 (×2): 1000 mL

## 2023-10-01 MED ORDER — FENTANYL CITRATE (PF) 250 MCG/5ML IJ SOLN
INTRAMUSCULAR | Status: AC
  Filled 2023-10-01: qty 5

## 2023-10-01 MED ORDER — MUPIROCIN 2 % EX OINT
1.0000 | TOPICAL_OINTMENT | Freq: Two times a day (BID) | CUTANEOUS | Status: AC
  Administered 2023-10-01 – 2023-10-05 (×9): 1 via NASAL
  Filled 2023-10-01 (×3): qty 22

## 2023-10-01 MED ORDER — DEXAMETHASONE SODIUM PHOSPHATE 10 MG/ML IJ SOLN
INTRAMUSCULAR | Status: DC | PRN
  Administered 2023-10-01: 8 mg via INTRAVENOUS

## 2023-10-01 MED ORDER — OXYCODONE HCL 5 MG/5ML PO SOLN
5.0000 mg | Freq: Once | ORAL | Status: DC | PRN
Start: 2023-10-01 — End: 2023-10-01

## 2023-10-01 MED ORDER — PHENYLEPHRINE HCL (PRESSORS) 10 MG/ML IV SOLN
INTRAVENOUS | Status: DC | PRN
  Administered 2023-10-01: 140 ug via INTRAVENOUS
  Administered 2023-10-01 (×2): 100 ug via INTRAVENOUS

## 2023-10-01 MED ORDER — DEXTROSE-SODIUM CHLORIDE 5-0.9 % IV SOLN: INTRAVENOUS | Status: AC

## 2023-10-01 MED ORDER — DEXAMETHASONE SODIUM PHOSPHATE 10 MG/ML IJ SOLN
INTRAMUSCULAR | Status: AC
  Filled 2023-10-01: qty 1

## 2023-10-01 MED ORDER — PROPOFOL 10 MG/ML IV BOLUS
INTRAVENOUS | Status: AC
  Filled 2023-10-01: qty 20

## 2023-10-01 MED ORDER — PHENYLEPHRINE HCL-NACL 20-0.9 MG/250ML-% IV SOLN
INTRAVENOUS | Status: DC | PRN
  Administered 2023-10-01: 30 ug/min via INTRAVENOUS

## 2023-10-01 MED ORDER — MENTHOL 3 MG MT LOZG: 1.0000 | LOZENGE | OROMUCOSAL | Status: DC | PRN

## 2023-10-01 MED ORDER — ACETAMINOPHEN 500 MG PO TABS: 1000.0000 mg | ORAL_TABLET | Freq: Once | ORAL | Status: DC

## 2023-10-01 MED ORDER — DEXMEDETOMIDINE HCL IN NACL 80 MCG/20ML IV SOLN
INTRAVENOUS | Status: AC
  Filled 2023-10-01: qty 20

## 2023-10-01 SURGICAL SUPPLY — 47 items
ADH SKN CLS APL DERMABOND .7 (GAUZE/BANDAGES/DRESSINGS) ×1
APL PRP STRL LF DISP 70% ISPRP (MISCELLANEOUS) ×1
BAG COUNTER SPONGE SURGICOUNT (BAG) IMPLANT
BAG SPNG CNTER NS LX DISP (BAG)
BLADE EXTENDED COATED 6.5IN (ELECTRODE) IMPLANT
CHLORAPREP W/TINT 26 (MISCELLANEOUS) ×1 IMPLANT
COVER SURGICAL LIGHT HANDLE (MISCELLANEOUS) ×1 IMPLANT
DERMABOND ADVANCED .7 DNX12 (GAUZE/BANDAGES/DRESSINGS) ×1 IMPLANT
DRAIN CHANNEL 19F RND (DRAIN) IMPLANT
DRAPE LAPAROSCOPIC ABDOMINAL (DRAPES) ×1 IMPLANT
DRSG OPSITE POSTOP 4X10 (GAUZE/BANDAGES/DRESSINGS) IMPLANT
DRSG OPSITE POSTOP 4X6 (GAUZE/BANDAGES/DRESSINGS) IMPLANT
DRSG OPSITE POSTOP 4X8 (GAUZE/BANDAGES/DRESSINGS) IMPLANT
ELECT REM PT RETURN 15FT ADLT (MISCELLANEOUS) ×1 IMPLANT
EVACUATOR SILICONE 100CC (DRAIN) IMPLANT
GAUZE PAD ABD 8X10 STRL (GAUZE/BANDAGES/DRESSINGS) IMPLANT
GAUZE SPONGE 4X4 12PLY STRL (GAUZE/BANDAGES/DRESSINGS) ×1 IMPLANT
GLOVE BIO SURGEON STRL SZ7.5 (GLOVE) ×2 IMPLANT
GLOVE INDICATOR 8.0 STRL GRN (GLOVE) ×2 IMPLANT
GOWN STRL REUS W/ TWL XL LVL3 (GOWN DISPOSABLE) ×4 IMPLANT
GOWN STRL REUS W/TWL XL LVL3 (GOWN DISPOSABLE) ×4
HANDLE SUCTION POOLE (INSTRUMENTS) IMPLANT
KIT TURNOVER KIT A (KITS) IMPLANT
LEGGING LITHOTOMY PAIR STRL (DRAPES) ×1 IMPLANT
LIGASURE IMPACT 36 18CM CVD LR (INSTRUMENTS) IMPLANT
PACK COLON (CUSTOM PROCEDURE TRAY) ×1 IMPLANT
PENCIL SMOKE EVACUATOR (MISCELLANEOUS) IMPLANT
RELOAD PROXIMATE 75MM BLUE (ENDOMECHANICALS) ×4 IMPLANT
RELOAD STAPLE 75 3.8 BLU REG (ENDOMECHANICALS) IMPLANT
STAPLER CVD CUT BL 40 RELOAD (ENDOMECHANICALS) ×1 IMPLANT
STAPLER CVD CUT BLU 40 RELOAD (ENDOMECHANICALS) IMPLANT
STAPLER PROXIMATE 75MM BLUE (STAPLE) IMPLANT
STAPLER VISISTAT 35W (STAPLE) ×1 IMPLANT
SUCTION POOLE HANDLE (INSTRUMENTS)
SUT ETHILON 2 0 PS N (SUTURE) IMPLANT
SUT MNCRL AB 4-0 PS2 18 (SUTURE) IMPLANT
SUT PDS AB 1 CT1 27 (SUTURE) IMPLANT
SUT PDS AB 1 TP1 96 (SUTURE) IMPLANT
SUT PROLENE 0 CT 1 30 (SUTURE) IMPLANT
SUT SILK 2 0 (SUTURE) ×1
SUT SILK 2 0 SH CR/8 (SUTURE) ×1 IMPLANT
SUT SILK 2-0 18XBRD TIE 12 (SUTURE) ×1 IMPLANT
SUT SILK 3 0 (SUTURE) ×1
SUT SILK 3 0 SH CR/8 (SUTURE) ×1 IMPLANT
SUT SILK 3-0 18XBRD TIE 12 (SUTURE) ×1 IMPLANT
SUT VIC AB 3-0 SH 18 (SUTURE) IMPLANT
TRAY FOLEY MTR SLVR 16FR STAT (SET/KITS/TRAYS/PACK) ×1 IMPLANT

## 2023-10-01 NOTE — Progress Notes (Signed)
*  PRELIMINARY RESULTS* Echocardiogram 2D Echocardiogram has been performed.  Elizabeth Odom 10/01/2023, 2:57 PM

## 2023-10-01 NOTE — Consult Note (Signed)
WOC consulted for new colostomy created today 10/21 for large bowel obstruction  WOC will plan FU 10/23 for post op ostomy teaching, no WOC nurse on the [employer].   Sankalp Ferrell [facility], CNS, [employer] [phone]

## 2023-10-01 NOTE — Progress Notes (Signed)
PHARMACY - ANTICOAGULATION CONSULT NOTE  Pharmacy Consult for Heparin Indication: pulmonary embolus  Allergies  Allergen Reactions   Elemental Sulfur Anaphylaxis    Patient Measurements: Height: 5\' 6"  (167.6 cm) Weight: 56.2 kg (124 lb) IBW/kg (Calculated) : 59.3 Heparin Dosing Weight:  56.2 kg  Vital Signs: Temp: 97.7 F (36.5 C) (10/21 1137) Temp Source: Oral (10/21 1137) BP: 151/55 (10/21 1137) Pulse Rate: 69 (10/21 1137)  Labs: Recent Labs    09/29/23 0647 10/01/23 0326  HGB 13.0 13.1  HCT 39.5 40.3  PLT 512* 409*  CREATININE 0.83 0.82    Estimated Creatinine Clearance: 46.9 mL/min (by C-G formula based on SCr of 0.82 mg/dL).   Medical History: Past Medical History:  Diagnosis Date   A-fib (HCC) 04/09/2016   Acute head injury 04/09/2016   Cervical cancer (HCC)    Cervical spine fracture, initial encounter 04/09/2016   Essential hypertension    History of anemia    Multiple fractures of cervical spine (HCC) 04/09/2016   PAC (premature atrial contraction)    Restless leg    Syncope and collapse 04/09/2016   Assessment: Active Problem(s): large bowel obstruction due to sigmoid rectal stricture.   PMH: HTN, cervical cancer, Pancreatic tail cyst   AC/Heme: PE - 10/20 CT (addend): acute small-to-moderate volume pulmonary embolism. No evidence of right heart strain or lung infarction.  - Baseline Hgb 13.1, Plts 409 - 10/21 Surgery: EBL  Goal of Therapy:  Heparin level 0.3-0.7 units/ml Monitor platelets by anticoagulation protocol: Yes   Plan:  IV heparin (no bolus) at 1000 units/hr (may start at 6pm per surgery) Check heparin level 8 hrs after heparin starts. Daily HL and CBC   Stanislaus Kaltenbach S. Merilynn Finland, PharmD, BCPS Clinical Staff Pharmacist [email or web address] Merilynn Finland, [employer] 10/01/2023,1:25 PM

## 2023-10-01 NOTE — Anesthesia Postprocedure Evaluation (Signed)
Anesthesia Post Note  Patient: Elizabeth Odom  Procedure(s) Performed: COLECTOMY WITH COLOSTOMY CREATION/HARTMANN PROCEDURE     Patient location during evaluation: PACU Anesthesia Type: General Level of consciousness: awake Pain management: pain level controlled Vital Signs Assessment: post-procedure vital signs reviewed and stable Respiratory status: spontaneous breathing, nonlabored ventilation and respiratory function stable Cardiovascular status: blood pressure returned to baseline and stable Postop Assessment: no apparent nausea or vomiting Anesthetic complications: no   No notable events documented.  Last Vitals:  Vitals:   10/01/23 1345 10/01/23 1407  BP:    Pulse:    Resp:    Temp:    SpO2: 99% 99%    Last Pain:  Vitals:   10/01/23 1407  TempSrc:   PainSc: 0-No pain                 Linton Rump

## 2023-10-01 NOTE — Transfer of Care (Signed)
Immediate Anesthesia Transfer of Care Note  Patient: Elizabeth Odom  Procedure(s) Performed: COLECTOMY WITH COLOSTOMY CREATION/HARTMANN PROCEDURE  Patient Location: PACU  Anesthesia Type:General  Level of Consciousness: oriented and patient cooperative  Airway & Oxygen Therapy: Patient Spontanous Breathing and Patient connected to face mask oxygen  Post-op Assessment: Report given to RN and Post -op Vital signs reviewed and stable  Post vital signs: Reviewed and stable  Last Vitals:  Vitals Value Taken Time  BP 144/61 10/01/23 1021  Temp    Pulse 70 10/01/23 1026  Resp 16 10/01/23 1026  SpO2 99 % 10/01/23 1026  Vitals shown include unfiled device data.  Last Pain:  Vitals:   10/01/23 0631  TempSrc: Oral  PainSc:       Patients Stated Pain Goal: 0 (10/01/23 0022)  Complications: No notable events documented.

## 2023-10-01 NOTE — Op Note (Signed)
10/01/2023  9:59 AM  PATIENT:  Elizabeth Odom  82 y.o. female  PRE-OPERATIVE DIAGNOSIS: Sigmoid stricture  POST-OPERATIVE DIAGNOSIS: Same  PROCEDURE:  Procedure(s): Sigmoid COLECTOMY WITH COLOSTOMY CREATION/HARTMANN PROCEDURE (N/A) Small bowel resection with anastomosis  SURGEON:  Surgeons and Role:    Axel Filler, MD - Primary  ANESTHESIA:   General  EBL:  450 mL   BLOOD ADMINISTERED:none  DRAINS: none   LOCAL MEDICATIONS USED:  NONE  SPECIMEN:  Source of Specimen: Sigmoid colon with stricture, small bowel with areas of ischemia  DISPOSITION OF SPECIMEN:  PATHOLOGY  COUNTS:  YES  TOURNIQUET:  * No tourniquets in log *  DICTATION: .Dragon Dictation Indication procedure: Patient is a 82 year old female with a history of a sigmoid stricture.  Patient with a large bowel obstruction.  She was taken back to the operating emergently for ex lap and sigmoid resection.  Findings: Patient had a portion of the small bowel adherent to the area of the stricture.  There was some areas of ischemia secondary to omental adhesions.  The sigmoid-rectal junction appeared to have a mass.  Difficult to assess whether or not this was an old abscess versus tumor.  This was resected with normal healthy colon distally.  A descending colostomy was brought out the left upper quadrant area.  A portion of the small bowel that was adherent and ischemic down to the stricture was resected secondary to some ischemia.  Details of procedure: After the patient was consented patient was taken back to the OR and placed in supine position with bilateral SCDs placed.  She underwent general endotracheal intubation.  She is then prepped and draped in standard fashion.  A timeout was called all facts verified.  At this time a lower midline incision was made with a #10 blade.  Dissection was taken down the linea alba using cautery to maintain hemostasis.  The fascia was elevated and the peritoneum was entered  bluntly.  There is large amount of distended small bowel.  The fascia was then extended to the length of skin incision.  I did extend the incision just above the umbilicus.  At this time I was able to palpate the area of concern at the rectosigmoid junction.  There was some omental adhesions that were lysed.  It was apparent there was some small bowel that was adherent down to the area of concern.  These were broken up bluntly.  At this time a Bookwalter device was used to help with abdominal wall retraction.  At this time I decided to incise the white line of Toldt to the left colon.  Carried this dissection proximally distally.  Distally there was an area that was adherent to the side pelvic wall.  This was broken up both with blunt dissection and electrocautery.  I was able to free this up enough to feel normal rectal wall.  At this time the left ureter was identified and protected for the remaining portions of the operation.  Upon freeing up the area of the stricture a chosen area of the descending colon to transect.  The mesenteric window was made using cautery.  A 75 GIA stapler was then used to transect the proximal portion of the sigmoid colon.  At this time a LigaSure device was used to ligate the mesentery.  This was taken distally.  I continued this down past the area of the stricture.  At this time a contour blue load was then used to transect the distal portion of  normal rectum.  This was then sent off to pathology.  At this time 0 Prolene's were used to tack the staple line.  The area was irrigated out with sterile saline.  At this time around the small bowel from the ligament of Treitz distally.  There is a few areas that were adherent to the stricture site itself that appear to be ischemic secondary to tension.  There was also an area that was directly involved this and to be questionable.  At this time I decided to remove this en bloc.  A LigaSure device was used to ligate the mesentery.  An  area just proximal to the area of ischemia was chosen for transection.  A mesenteric window was made in 75 GIA stapler was used to transect this area.  Both the proximal and distal ends of small bowel were brought together.  Anastomosis was created using 75 GIA stapler and the anastomosis was closed using a 75 GIA stapler.  The staple lines were offset.  An apex stitch was placed using 3-0 silk.  The mesentery was reapproximate using figure-of-eight 3 oh silks.  At this time the abdominal cavity irrigated out with sterile saline.  A area for the ostomy aperture was chosen.  A circular incision was made.  The anterior posterior fascia was incised in a cruciate fashion.  2 finger breaths were allowed to be dilating the aperture.  At this time the end colostomy was brought up to the abdominal wall.  The midline fascia was then reapproximated using #1 PDS single-stranded in a standard running fashion x 2.    At this time the staple line was removed from the end colostomy site.  This was matured using 3 oh silks in interrupted fashion.  At this time the midline wound was packed with saline soaked gauze.  Ostomy appliance was placed.  Patient tolerated procedure well was taken to the recovery room stable condition.    PLAN OF CARE: Admit to inpatient   PATIENT DISPOSITION:  PACU - hemodynamically stable.   Delay start of Pharmacological VTE agent (>24hrs) due to surgical blood loss or risk of bleeding: yes

## 2023-10-01 NOTE — Anesthesia Procedure Notes (Signed)
Procedure Name: Intubation Date/Time: 10/01/2023 7:50 AM  Performed by: Garth Bigness, CRNAPre-anesthesia Checklist: Patient identified, Emergency Drugs available, Suction available and Patient being monitored Patient Re-evaluated:Patient Re-evaluated prior to induction Oxygen Delivery Method: Circle system utilized Preoxygenation: Pre-oxygenation with 100% oxygen Induction Type: IV induction, Rapid sequence and Cricoid Pressure applied Ventilation: Mask ventilation without difficulty Laryngoscope Size: Mac and 3 Tube type: Oral Number of attempts: 1 Airway Equipment and Method: Stylet Placement Confirmation: ETT inserted through vocal cords under direct vision, positive ETCO2 and breath sounds checked- equal and bilateral Secured at: 23 cm Tube secured with: Tape Dental Injury: Teeth and Oropharynx as per pre-operative assessment

## 2023-10-01 NOTE — Interval H&P Note (Signed)
History and Physical Interval Note:  10/01/2023 7:36 AM  Elizabeth Odom  has presented today for surgery, with the diagnosis of ACUTE COLITIS.  The various methods of treatment have been discussed with the patient and family. After consideration of risks, benefits and other options for treatment, the patient has consented to  Procedure(s): COLECTOMY WITH COLOSTOMY CREATION/HARTMANN PROCEDURE (N/A) as a surgical intervention.  The patient's history has been reviewed, patient examined, no change in status, stable for surgery.  I have reviewed the patient's chart and labs.  Questions were answered to the patient's satisfaction.     Axel Filler

## 2023-10-01 NOTE — Progress Notes (Signed)
PROGRESS NOTE Elizabeth Odom  YQM:[id number] DOB: 29-Apr-1941 DOA: 09/27/2023 PCP: [name], FNP  Brief Narrative/Hospital Course: 82 year old female with history of A-fib, RLS, fall risk, anemia who was seen in the ED with last week on 09/20/23 for abdominal pain Suprapubic, decreased appetite and also fever and in ED had CT done that showed colitis and has been on Augmentin until 2 days PTA and was also told to increase her MiraLAX, the nshe had mild symptomatic improvement on 10/16 but had had severe recurrence of her pain and intolerance to oral intake and 3 episodes of nonbilious and nonbloody diarrhea so return to the ED on 09/27/2023. Patient reported diarrhea since he had been on antibiotics initially suprapubic pain now all over unable to keep anything down due to nausea vomiting.  Last colonoscopy probably 10 years ago. In the ED: Vital stable labs showed leukocytosis 15.5. CT ABD W/ contrast>>Wall thickening of the proximal sigmoid colon worrisome for neoplasm. Focal colitis not excluded. There is upstream colonic dilatation with air-fluid levels and large amount of stool compatible with obstruction. Small amount of free fluid in the pelvis. Fatty infiltration of the liver.  9 cm cystic lesion in the tail of the pancreas.  Subcentimeter lucent lesion in the L1 vertebral body indeterminate. Seen by GI- refused colonoscopy agreed for sigmoidoscopy> done 09/29/23>shows very tight structure versus mass in the rectosigmoid could not pass ultrathin colonoscope or even endoscope,  surgery consulted,  biopsies obtained but not sure if they will be adequate > CCS discussed over of uncertain situation tumor like a mass versus possible colitis versus possible ischemic colitis and after extensive discussion with patient and patient's family proceeding with surgery based on obstructive symptoms and finding.  Subjective: Seen and examined, Overnight vitals/labs/events reviewed  She is alert awake,  NG tube on suction Having bowel movement denies chest pain or shortness of breath S/p sigmoid colectomy with colostomy creation this morning   Assessment and Plan: Principal Problem:   Acute colitis   Abdominal pain Large bowel obstruction due to recto-sigmoid/sigmoid stricture:? inflammatory versus mass Possible acute colitis and diarrhea: Seen by GI, s/p sigmoidoscopy> done 09/29/23>shows very tight structure versus mass in the rectosigmoid could not pass ultrathin colonoscope or even endoscope>subsequently surgery consulted who discussed about uncertain situation tumor like a mass versus possible colitis versus possible ischemic colitis and after extensive discussion, proceeded with sigmoid colectomy with colostomy creation 10/21.  Patient had been on empiric Zosyn, prophylactic Lovenox for DVT/PE prevention. S/p OR this am.  Vitals stable. On ngt decompression, awaiting for return of bowel function CT chest was ordered for staging> see updated report-nodule in the inferior aspect of the isthmus will need thyroid ultrasound  9 mm Pancreatic cyst: S/p MRI>showed pancreatic cyst in the pancreatic body/tail junction with differential considerations of pseudocyst or indolent cystic neoplasm. Per consensus criteria, this warrants follow-up with pre and post contrast abdominal MRI at 1 year.  Hyperglycemia Prediabetes with A1c 5.8: Blood sugar well-controlled monitor Accu-Chek  Recent Labs  Lab 09/28/23 0025 09/28/23 0405 09/30/23 1631 09/30/23 2001 09/30/23 2358 10/01/23 0400 10/01/23 1216  GLUCAP  --    < > 105* 98 89 101* 166*  HGBA1C 5.8*  --   --   --   --   --   --    < > = values in this interval not displayed.    Hyponatremia: Hypokalemia: Stable. Monitor  Small to moderate-sized PE w/o right heart strain: Called by Dr. Ramiro Harvest from radiology that  upon review on CT chest from 10/20-he noticed nonocclusive filling defect in segmental pulmonary artery of right upper lobe  right lower lobe and left upper lobe-compatible with acute small to moderate volume PE. I immediately came and examined patient and discussed with her. Also discussed with Dr. [name] from general surgery regarding anticoagulation: He is okay with heparin anticoagulation without bolus- pharmacy consulted.Of note patient had been on prophylactic Lovenox for VTE prevention preop.  Transfer to telemetry floor, monitor hemodynamics, obtain echocardiogram.  DVT prophylaxis: SCD's Start: 10/01/23 1141 Place TED hose Start: 10/01/23 1141 Code Status:   Code Status: Full Code Family Communication: plan of care discussed with patient/No family at bedside. Patient status is: Inpatient because of colitis/abdominal pain  Level of care: Med-Surg  Dispo: The patient is from: Home, independent            Anticipated disposition: tbd Objective: Vitals last 24 hrs: Vitals:   10/01/23 1045 10/01/23 1100 10/01/23 1115 10/01/23 1137  BP: 131/64 132/68 (!) 147/59 (!) 151/55  Pulse: 76 72 74 69  Resp: (!) 22 15 19 16   Temp:    97.7 F (36.5 C)  TempSrc:    Oral  SpO2: 94% 98% 98% (!) 81%  Weight:      Height:       Weight change:   Physical Examination: General exam: alert awake,eyes open,Ngt+ HEENT:Oral mucosa moist, Ear/Nose WNL grossly Respiratory system: Bilaterally clear BS,no use of accessory muscle Cardiovascular system: S1 & S2 +, No JVD. Gastrointestinal system: Abdomen soft tender in postop area colostomy in the left side,ND, BS+ Nervous System: Alert, awake, moving all extremities,and following commands. Extremities: LE edema neg,distal peripheral pulses palpable and warm.  Skin: No rashes,no icterus. MSK: Normal muscle bulk,tone, power  Medications reviewed:  Scheduled Meds:  acidophilus  2 capsule Oral TID   bisacodyl  10 mg Rectal Once   Chlorhexidine Gluconate Cloth  6 each Topical Q0600   insulin aspart  0-9 Units Subcutaneous Q4H   mupirocin ointment  1 Application Nasal BID    sodium phosphate  1 enema Rectal Once  Continuous Infusions:  dextrose 5 % and 0.9 % NaCl 40 mL/hr at 10/01/23 1217   piperacillin-tazobactam 3.375 g (10/01/23 1213)    Diet Order             Diet NPO time specified  Diet effective now                  Intake/Output Summary (Last 24 hours) at 10/01/2023 1318 Last data filed at 10/01/2023 1100 Gross per 24 hour  Intake 2017.22 ml  Output 600 ml  Net 1417.22 ml   Net IO Since Admission: 2,305.11 mL [10/01/23 1318]  Wt Readings from Last 3 Encounters:  09/28/23 56.2 kg  09/20/23 56.2 kg  11/23/20 55.3 kg     Unresulted Labs (From admission, onward)     Start     Ordered   10/02/23 0500  Basic metabolic panel  Tomorrow morning,   R        10/01/23 1140   10/02/23 0500  CBC  Tomorrow morning,   R        10/01/23 1140   10/01/23 0500  Basic metabolic panel  Daily,   R      09/30/23 0825   10/01/23 0500  CBC  Daily,   R      09/30/23 0825          Data Reviewed: I have personally reviewed following  labs and imaging studies CBC: Recent Labs  Lab 09/27/23 2007 09/28/23 0025 09/28/23 0648 09/29/23 0647 10/01/23 0326  WBC 15.5* 14.0* 13.5* 12.0* 11.3*  NEUTROABS  --   --  10.9*  --   --   HGB 14.5 12.5 12.5 13.0 13.1  HCT 42.9 37.5 38.0 39.5 40.3  MCV 90.5 92.6 95.0 93.6 94.8  PLT 735* 542* 515* 512* 409*   Basic Metabolic Panel: Recent Labs  Lab 09/27/23 2007 09/28/23 0025 09/28/23 0648 09/29/23 0647 10/01/23 0326  NA 134*  --  132* 135 133*  K 3.6  --  3.3* 3.9 3.9  CL 99  --  98 99 97*  CO2 22  --  25 26 25   GLUCOSE 184*  --  120* 113* 109*  BUN 16  --  14 13 16   CREATININE 0.90 0.86 0.83 0.83 0.82  CALCIUM 9.5  --  8.5* 9.1 8.7*   GFR: Estimated Creatinine Clearance: 46.9 mL/min (by C-G formula based on SCr of 0.82 mg/dL). Liver Function Tests: Recent Labs  Lab 09/27/23 2007  AST 23  ALT 28  ALKPHOS 91  BILITOT 0.6  PROT 7.8  ALBUMIN 3.7   Recent Labs  Lab 09/27/23 2007   LIPASE 41   No results for input(s): "HGBA1C" in the last 72 hours. CBG: Recent Labs  Lab 09/30/23 1631 09/30/23 2001 09/30/23 2358 10/01/23 0400 10/01/23 1216  GLUCAP 105* 98 89 101* 166*   Recent Results (from the past 240 hour(s))  Surgical pcr screen     Status: Abnormal   Collection Time: 10/01/23  4:19 AM   Specimen: Nasal Mucosa; Nasal Swab  Result Value Ref Range Status   MRSA, PCR POSITIVE (A) NEGATIVE Final    Comment: RESULT CALLED TO, READ BACK BY AND VERIFIED WITH: NGUYEN, T. RN AT [phone] ON 10/01/2023 BY MECIAL J.    Staphylococcus aureus POSITIVE (A) NEGATIVE Final    Comment: (NOTE) The Xpert SA Assay (FDA approved for NASAL specimens in patients 12 years of age and older), is one component of a comprehensive surveillance program. It is not intended to diagnose infection nor to guide or monitor treatment. Performed at [facility], [address] [place], [place] [address]     Antimicrobials: Anti-infectives (From admission, onward)    Start     Dose/Rate Route Frequency Ordered Stop   09/27/23 2315  piperacillin-tazobactam (ZOSYN) IVPB 3.375 g        3.375 g 12.5 mL/hr over 240 Minutes Intravenous Every 8 hours 09/27/23 2305        Culture/Microbiology    Component Value Date/Time   SDES  09/20/2023 1930    BLOOD BLOOD RIGHT HAND Performed at [facility], [address] [place], [place] [address]    SPECREQUEST  09/20/2023 1930    BOTTLES DRAWN AEROBIC AND ANAEROBIC Blood Culture results may not be optimal due to an inadequate volume of blood received in culture bottles Performed at [facility], [address] [place], [place] [address]    CULT  09/20/2023 1930    NO GROWTH 5 DAYS Performed at [facility], [address] [place], [place] [address]    REPTSTATUS 09/25/2023 FINAL 09/20/2023 1930  Radiology Studies: CT CHEST W CONTRAST  Addendum Date: 10/01/2023   ADDENDUM  REPORT: 10/01/2023 13:00 ADDENDUM: Upon re-review of CT scan chest with contrast performed yesterday (09/30/2023), the following findings are noted: There are several areas of nonocclusive filling defects  in the segmental pulmonary artery branches to right upper lobe, right lower lobe and left upper lobe (marked with electronic arrow sign on series 2), compatible with acute small-to-moderate volume pulmonary embolism. No evidence of right heart strain or lung infarction. " Critical Value/emergent results were called by telephone at the time of interpretation on 10/01/2023 at 12:58 pm to provider Dr. Jonathon Bellows, Dayna Barker , who verbally acknowledged these results. Electronically Signed   By: Jules Schick M.D.   On: 10/01/2023 13:00   Result Date: 10/01/2023 CLINICAL DATA:  Colon cancer, staging. * Tracking Code: BO * EXAM: CT CHEST WITH CONTRAST TECHNIQUE: Multidetector CT imaging of the chest was performed during intravenous contrast administration. RADIATION DOSE REDUCTION: This exam was performed according to the departmental dose-optimization program which includes automated exposure control, adjustment of the mA and/or kV according to patient size and/or use of iterative reconstruction technique. CONTRAST:  75mL OMNIPAQUE IOHEXOL 300 MG/ML  SOLN COMPARISON:  None Available. FINDINGS: Cardiovascular:  No pericardial effusion. No aortic aneurysm. Mediastinum/Nodes: There is a 1.4 x 1.9 cm hypoattenuating nodule in the inferior aspect of the isthmus, not well characterized on the current exam. The nodule meets the size criteria for follow-up nonemergent evaluation with thyroid ultrasound. No solid / cystic mediastinal masses. The esophagus is nondistended precluding optimal assessment. No axillary, mediastinal or hilar lymphadenopathy by size criteria. Lungs/Pleura: The central tracheo-bronchial tree is patent. There are patchy areas of linear, plate-like atelectasis and/or scarring throughout bilateral lungs. No mass or  consolidation. No pleural effusion or pneumothorax. No suspicious lung nodules. Upper Abdomen: Visualized upper abdominal viscera within normal limits. Musculoskeletal: The visualized soft tissues of the chest wall are grossly unremarkable. No suspicious osseous lesions. There are mild multilevel degenerative changes in the visualized spine. IMPRESSION: 1. No evidence of metastatic disease in the chest. 2. 1.4 x 1.9 cm hypoattenuating nodule in the inferior aspect of the isthmus, not well characterized on the current exam. The nodule meets the size criteria for follow-up nonemergent evaluation with thyroid ultrasound. Electronically Signed: By: Jules Schick M.D. On: 09/30/2023 15:22     LOS: 4 days   Lanae Boast, MD [facility]  10/01/2023, 1:18 PM

## 2023-10-02 ENCOUNTER — Encounter (HOSPITAL_COMMUNITY): Payer: Self-pay | Admitting: General Surgery

## 2023-10-02 DIAGNOSIS — K529 Noninfective gastroenteritis and colitis, unspecified: Secondary | ICD-10-CM | POA: Diagnosis not present

## 2023-10-02 LAB — BASIC METABOLIC PANEL
Anion gap: 6 (ref 5–15)
Anion gap: 6 (ref 5–15)
BUN: 19 mg/dL (ref 8–23)
BUN: 34 mg/dL — ABNORMAL HIGH (ref 8–23)
CO2: 27 mmol/L (ref 22–32)
CO2: 26 mmol/L (ref 22–32)
Calcium: 8 mg/dL — ABNORMAL LOW (ref 8.9–10.3)
Calcium: 7.8 mg/dL — ABNORMAL LOW (ref 8.9–10.3)
Chloride: 104 mmol/L (ref 98–111)
Chloride: 103 mmol/L (ref 98–111)
Creatinine, Ser: 0.92 mg/dL (ref 0.44–1.00)
Creatinine, Ser: 0.68 mg/dL (ref 0.44–1.00)
GFR, Estimated: >60 mL/min (ref >60)
GFR, Estimated: >60 mL/min (ref >60)
Glucose, Bld: 249 mg/dL — ABNORMAL HIGH (ref 70–99)
Glucose, Bld: 146 mg/dL — ABNORMAL HIGH (ref 70–99)
Potassium: 3.8 mmol/L (ref 3.5–5.1)
Potassium: 4 mmol/L (ref 3.5–5.1)
Sodium: 137 mmol/L (ref 135–145)
Sodium: 135 mmol/L (ref 135–145)

## 2023-10-02 LAB — SURGICAL PATHOLOGY

## 2023-10-02 LAB — HEPARIN LEVEL (UNFRACTIONATED)
Heparin Unfractionated: 0.26 [IU]/mL — ABNORMAL LOW (ref 0.30–0.70)
Heparin Unfractionated: 0.57 [IU]/mL (ref 0.30–0.70)

## 2023-10-02 LAB — CBC
HCT: 31.4 % — ABNORMAL LOW (ref 36.0–46.0)
Hemoglobin: 10.2 g/dL — ABNORMAL LOW (ref 12.0–15.0)
MCH: 30.7 pg (ref 26.0–34.0)
MCHC: 32.5 g/dL (ref 30.0–36.0)
MCV: 94.6 fL (ref 80.0–100.0)
Platelets: 314 10*3/uL (ref 150–400)
RBC: 3.32 MIL/uL — ABNORMAL LOW (ref 3.87–5.11)
RDW: 12.6 % (ref 11.5–15.5)
WBC: 13.7 10*3/uL — ABNORMAL HIGH (ref 4.0–10.5)
nRBC: 0 % (ref 0.0–0.2)

## 2023-10-02 LAB — GLUCOSE, CAPILLARY
Glucose-Capillary: 110 mg/dL — ABNORMAL HIGH (ref 70–99)
Glucose-Capillary: 116 mg/dL — ABNORMAL HIGH (ref 70–99)
Glucose-Capillary: 118 mg/dL — ABNORMAL HIGH (ref 70–99)
Glucose-Capillary: 123 mg/dL — ABNORMAL HIGH (ref 70–99)
Glucose-Capillary: 130 mg/dL — ABNORMAL HIGH (ref 70–99)
Glucose-Capillary: 131 mg/dL — ABNORMAL HIGH (ref 70–99)

## 2023-10-02 MED ORDER — HYDROMORPHONE HCL 1 MG/ML IJ SOLN
0.5000 mg | INTRAMUSCULAR | Status: DC | PRN
  Administered 2023-10-02: 0.5 mg via INTRAVENOUS
  Filled 2023-10-02: qty 0.5

## 2023-10-02 MED ORDER — DEXTROSE-SODIUM CHLORIDE 5-0.9 % IV SOLN
INTRAVENOUS | Status: AC
Start: 2023-10-02 — End: 2023-10-03

## 2023-10-02 MED ORDER — HYDROMORPHONE HCL 1 MG/ML IJ SOLN
0.5000 mg | INTRAMUSCULAR | Status: DC | PRN
  Administered 2023-10-02 (×4): 1 mg via INTRAVENOUS
  Administered 2023-10-03 (×2): 2 mg via INTRAVENOUS
  Administered 2023-10-03: 1 mg via INTRAVENOUS
  Administered 2023-10-03: 2 mg via INTRAVENOUS
  Administered 2023-10-03: 1 mg via INTRAVENOUS
  Administered 2023-10-03 – 2023-10-04 (×4): 2 mg via INTRAVENOUS
  Administered 2023-10-04 (×2): 1 mg via INTRAVENOUS
  Administered 2023-10-04: 2 mg via INTRAVENOUS
  Administered 2023-10-05: 1 mg via INTRAVENOUS
  Administered 2023-10-05 – 2023-10-06 (×3): 2 mg via INTRAVENOUS
  Administered 2023-10-06 (×2): 1 mg via INTRAVENOUS
  Administered 2023-10-07: 2 mg via INTRAVENOUS
  Administered 2023-10-07: 1 mg via INTRAVENOUS
  Administered 2023-10-08 – 2023-10-09 (×3): 2 mg via INTRAVENOUS
  Administered 2023-10-10: 1 mg via INTRAVENOUS
  Administered 2023-10-10 – 2023-10-14 (×10): 2 mg via INTRAVENOUS
  Administered 2023-10-15: 1 mg via INTRAVENOUS
  Administered 2023-10-16 (×2): 0.5 mg via INTRAVENOUS
  Filled 2023-10-02: qty 2
  Filled 2023-10-02: qty 1
  Filled 2023-10-02 (×5): qty 2
  Filled 2023-10-02 (×2): qty 1
  Filled 2023-10-02: qty 2
  Filled 2023-10-02: qty 1
  Filled 2023-10-02 (×2): qty 2
  Filled 2023-10-02: qty 1
  Filled 2023-10-02 (×7): qty 2
  Filled 2023-10-02: qty 1
  Filled 2023-10-02: qty 2
  Filled 2023-10-02 (×2): qty 1
  Filled 2023-10-02: qty 2
  Filled 2023-10-02: qty 1
  Filled 2023-10-02: qty 2
  Filled 2023-10-02 (×3): qty 1
  Filled 2023-10-02 (×2): qty 2
  Filled 2023-10-02: qty 1
  Filled 2023-10-02 (×5): qty 2
  Filled 2023-10-02: qty 1
  Filled 2023-10-02 (×3): qty 2
  Filled 2023-10-02: qty 1

## 2023-10-02 NOTE — TOC Initial Note (Signed)
Transition of Care [facility]) - Initial/Assessment Note   Patient Details  Name: Elizabeth Odom MRN: [id number] Date of Birth: Jul 21, 1941  [facility]) CM/SW Contact:    Ewing Schlein, LCSW Phone Number: 10/02/2023, 3:56 PM  Clinical Narrative: Patient will need HHRN for a new ostomy and PT recommended HHPT. CSW met with patient and son regarding referral. Patient does not have HH agency preference. CSW made referral to [facility], which was accepted. HH orders requested.              Expected Discharge Plan: Home w Home Health Services Barriers to Discharge: Continued Medical Work up  Patient Goals and CMS Choice Patient states their goals for this hospitalization and ongoing recovery are:: Return home CMS Medicare.gov Compare Post Acute Care list provided to:: Patient Choice offered to / list presented to : Patient  Expected Discharge Plan and Services In-house Referral: Clinical Social Work Post Acute Care Choice: Home Health Living arrangements for the past 2 months: Independent Living Facility, Apartment            DME Arranged: N/A DME Agency: NA HH Arranged: PT, RN HH Agency: [facility] Date HH Agency Contacted: 10/02/23 Time HH Agency Contacted: 1550 Representative spoke with at [facility] Agency: Morrie Sheldon  Prior Living Arrangements/Services Living arrangements for the past 2 months: Marketing executive, Apartment Lives with:: Self Patient language and need for interpreter reviewed:: Yes Do you feel safe going back to the place where you live?: Yes      Need for Family Participation in Patient Care: No (Comment) Care giver support system in place?: Yes (comment) Criminal Activity/Legal Involvement Pertinent to Current Situation/Hospitalization: No - Comment as needed  Activities of Daily Living ADL Screening (condition at time of admission) Independently performs ADLs?: Yes (appropriate for developmental age) Is the patient  deaf or have difficulty hearing?: No Does the patient have difficulty seeing, even when wearing glasses/contacts?: No Does the patient have difficulty concentrating, remembering, or making decisions?: No  Permission Sought/Granted Permission sought to share information with : Other (comment) Permission granted to share information with : Yes, Verbal Permission Granted Permission granted to share info w AGENCY: HH agencies  Emotional Assessment Appearance:: Appears stated age Attitude/Demeanor/Rapport: Engaged Affect (typically observed): Accepting Orientation: : Oriented to Self, Oriented to Place, Oriented to  Time, Oriented to Situation Alcohol / Substance Use: Not Applicable Psych Involvement: No (comment)  Admission diagnosis:  Generalized abdominal pain [R10.84] Acute colitis [K52.9] Patient Active Problem List   Diagnosis Date Noted   Acute colitis 09/27/2023   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   Restless leg    Multiple fractures of cervical spine (HCC) 04/09/2016   Cervical spine fracture, initial encounter 04/09/2016   Syncope and collapse 04/09/2016   A-fib (HCC) 04/09/2016   Acute head injury 04/09/2016   PCP:  [name], FNP Pharmacy:   [id number] - [place], [place] - [address] [place] [address] Phone: [phone] Fax: [phone]  Social Determinants of Health (SDOH) Social History: SDOH Screenings   Food Insecurity: No Food Insecurity (09/28/2023)  Housing: Low Risk  (09/28/2023)  Transportation Needs: No Transportation Needs (09/28/2023)  Utilities: Not At Risk (09/28/2023)  Tobacco Use: Low Risk  (10/01/2023)   SDOH Interventions:    Readmission Risk Interventions     No data to display

## 2023-10-02 NOTE — Progress Notes (Signed)
[facility] Gastroenterology Progress Note  Elizabeth Odom 82 y.o. 1941-05-31   Subjective: Abdominal pain.   Objective: Vital signs: Vitals:   10/02/23 1110 10/02/23 1349  BP:  (!) 125/51  Pulse:  72  Resp:  15  Temp:  98.2 F (36.8 C)  SpO2: 98% 99%    Physical Exam: Gen: lethargic, elderly, mild acute distress, thin HEENT: anicteric sclera, NGT present CV: RRR Chest: CTA B Abd: diffuse tenderness to light palpation, decreased bowel sounds, midline surgical dressing and LLQ ostomy Ext: no edema  Lab Results: Recent Labs    10/01/23 0326 10/02/23 0408  NA 133* 137  K 3.9 3.8  CL 97* 104  CO2 25 27  GLUCOSE 109* 249*  BUN 16 19  CREATININE 0.82 0.92  CALCIUM 8.7* 8.0*   No results for input(s): "AST", "ALT", "ALKPHOS", "BILITOT", "PROT", "ALBUMIN" in the last 72 hours. Recent Labs    10/01/23 0326 10/02/23 0408  WBC 11.3* 13.7*  HGB 13.1 10.2*  HCT 40.3 31.4*  MCV 94.8 94.6  PLT 409* 314      Assessment/Plan: S/P sigmoid colectomy due to stricture and partial small bowel resection due to ischemia - postop care and pain management per surgery. Path pending. [facility] will sign off. Call us back if questions.   Elizabeth Odom 10/02/2023, 1:51 PM  Questions please call [phone] ID: Elizabeth Odom, female   DOB: 20-Nov-1941, 82 y.o.   MRN: [id number]

## 2023-10-02 NOTE — Progress Notes (Signed)
PROGRESS NOTE Elizabeth Odom  YNW:[id number] DOB: [birth year] DOA: 09/27/2023 PCP: [name], FNP  Brief Narrative/Hospital Course: 82 year old female with history of A-fib, RLS, fall risk, anemia who was seen in the ED with last week on 09/20/23 for abdominal pain Suprapubic, decreased appetite and also fever and in ED had CT done that showed colitis and has been on Augmentin until 2 days PTA and was also told to increase her MiraLAX, the nshe had mild symptomatic improvement on 10/16 but had had severe recurrence of her pain and intolerance to oral intake and 3 episodes of nonbilious and nonbloody diarrhea so return to the ED on 09/27/2023. Patient reported diarrhea since he had been on antibiotics initially suprapubic pain now all over unable to keep anything down due to nausea vomiting.  Last colonoscopy probably 10 years ago. In the ED: Vital stable labs showed leukocytosis 15.5. CT ABD W/ contrast>>Wall thickening of the proximal sigmoid colon worrisome for neoplasm. Focal colitis not excluded. There is upstream colonic dilatation with air-fluid levels and large amount of stool compatible with obstruction. Small amount of free fluid in the pelvis. Fatty infiltration of the liver.  9 cm cystic lesion in the tail of the pancreas.  Subcentimeter lucent lesion in the L1 vertebral body indeterminate. Seen by GI- refused colonoscopy agreed for sigmoidoscopy> done 09/29/23>shows very tight structure versus mass in the rectosigmoid could not pass ultrathin colonoscope or even endoscope,  surgery consulted,  biopsies obtained but not sure if they will be adequate > CCS discussed over of uncertain situation tumor like a mass versus possible colitis versus possible ischemic colitis and after extensive discussion with patient and patient's family proceeding with surgery based on obstructive symptoms and finding.  Subjective: Seen and examined, Overnight vitals/labs/events reviewed -Has been afebrile on 1  to 2 L nasal cannula saturating 98% BP stable  Labs showed stable renal function mild leukocytosis hemoglobin 10.2 some drop.  Heparin level 0.26 NG output 350 cc, stool in colostomy 3 cc. Uop 800cc Alert awake, some difficulty with NG tube, has abdominal pain Denies chest pain or shortness of breath  Assessment and Plan: Principal Problem:   Acute colitis   Abdominal pain Large bowel obstruction due to recto-sigmoid/sigmoid stricture:? inflammatory versus mass Possible acute colitis and diarrhea: Seen by GI, s/p sigmoidoscopy> done 09/29/23>shows very tight structure versus mass in the rectosigmoid could not pass ultrathin colonoscope or even endoscope>subsequently surgery consulted who discussed about uncertain situation tumor like a mass versus possible colitis versus possible ischemic colitis and after extensive discussion, proceeded with sigmoid colectomy with colostomy creation 10/01/23.  Awaiting return of bowel function continue NGT, WTD dressing change today.Cont pain management PT OT ambulation per CCS.  9 mm Pancreatic cyst: S/p MRI>showed pancreatic cyst in the pancreatic body/tail junction with differential considerations of pseudocyst or indolent cystic neoplasm. Per consensus criteria, this warrants follow-up with pre and post contrast abdominal MRI at 1 year.  Nodule in the inferior aspect of the isthmus will need thyroid ultrasound  Hyperglycemia Prediabetes with A1c 5.8: Blood sugar is well-controlled monitor Accu-Chek  Recent Labs  Lab 09/28/23 0025 09/28/23 0405 10/01/23 1603 10/01/23 2032 10/02/23 0027 10/02/23 0453 10/02/23 0747  GLUCAP  --    < > 159* 140* 123* 118* 110*  HGBA1C 5.8*  --   --   --   --   --   --    < > = values in this interval not displayed.   Anemia normocytic: Hemoglobin holding 10 g  monitor closely while on heparin drip and postop status  Hyponatremia: Hypokalemia: Electrolytes stable.  Monitor  Small to moderate-sized Acute PE  w/o right heart strain: On review of CT chest from 10/20, found to have-nonocclusive filling defect in segmental pulmonary artery of right upper lobe right lower lobe and left upper lobe-compatible with acute small to moderate volume PE: Subsequently discussed with general surgery Dr. [name]  and started on heparin protocol without bolus.  Remains hemodynamically stable, echo surgery stable EF no right heart dysfunction.  Continue current heparin monitor CBC. Of note patient had been on prophylactic Lovenox for VTE prevention since admission.  Monitor in telemetry.   DVT prophylaxis: SCD's Start: 10/01/23 1141 Place TED hose Start: 10/01/23 1141 Code Status:   Code Status: Full Code Family Communication: plan of care discussed with patient/No family at bedside. Patient status is: Inpatient because of colitis/abdominal pain  Level of care: Telemetry  Dispo: The patient is from: Home, independent            Anticipated disposition: tbd Objective: Vitals last 24 hrs: Vitals:   10/02/23 0202 10/02/23 0456 10/02/23 0500 10/02/23 1003  BP: (!) 137/52 (!) 122/54  (!) 113/52  Pulse: 72 68  74  Resp: 17 17  17   Temp: 98.2 F (36.8 C) 98.3 F (36.8 C)  98.2 F (36.8 C)  TempSrc: Oral Oral    SpO2: 96% 98%  95%  Weight:   61.1 kg   Height:       Weight change:   Physical Examination: General exam: alert awake,  ngt+, ill appearing frail HEENT:Oral mucosa moist, Ear/Nose WNL grossly Respiratory system: Bilaterally clear BS,no use of accessory muscle Cardiovascular system: S1 & S2 +, No JVD. Gastrointestinal system: Abdomen soft, tender at surgical site abdomen appears full, left colostomy in place Nervous System: Alert, awake, moving all extremities,and following commands. Extremities: LE edema neg,distal peripheral pulses palpable and warm.  Skin: No rashes,no icterus. MSK: Normal muscle bulk,tone, power   Medications reviewed:  Scheduled Meds:  acidophilus  2 capsule Oral TID    bisacodyl  10 mg Rectal Once   Chlorhexidine Gluconate Cloth  6 each Topical Q0600   insulin aspart  0-9 Units Subcutaneous Q4H   mupirocin ointment  1 Application Nasal BID   sodium phosphate  1 enema Rectal Once  Continuous Infusions:  dextrose 5 % and 0.9 % NaCl 40 mL/hr at 10/01/23 1217   heparin 1,100 Units/hr (10/02/23 0513)   piperacillin-tazobactam 3.375 g (10/02/23 0500)    Diet Order             Diet NPO time specified  Diet effective now                  Intake/Output Summary (Last 24 hours) at 10/02/2023 1021 Last data filed at 10/02/2023 0600 Gross per 24 hour  Intake 1786.5 ml  Output 1153 ml  Net 633.5 ml   Net IO Since Admission: 2,138.61 mL [10/02/23 1021]  Wt Readings from Last 3 Encounters:  10/02/23 61.1 kg  09/20/23 56.2 kg  11/23/20 55.3 kg     Unresulted Labs (From admission, onward)     Start     Ordered   10/02/23 1300  Heparin level (unfractionated)  Once-Timed,   TIMED        10/02/23 0520   10/02/23 0500  CBC  Daily,   R      10/01/23 1328   10/01/23 0500  Basic metabolic panel  Daily,  R      09/30/23 0825   10/01/23 0500  CBC  Daily,   R      09/30/23 0825          Data Reviewed: I have personally reviewed following labs and imaging studies CBC: Recent Labs  Lab 09/28/23 0025 09/28/23 0648 09/29/23 0647 10/01/23 0326 10/02/23 0408  WBC 14.0* 13.5* 12.0* 11.3* 13.7*  NEUTROABS  --  10.9*  --   --   --   HGB 12.5 12.5 13.0 13.1 10.2*  HCT 37.5 38.0 39.5 40.3 31.4*  MCV 92.6 95.0 93.6 94.8 94.6  PLT 542* 515* 512* 409* 314   Basic Metabolic Panel: Recent Labs  Lab 09/27/23 2007 09/28/23 0025 09/28/23 0648 09/29/23 0647 10/01/23 0326 10/02/23 0408  NA 134*  --  132* 135 133* 137  K 3.6  --  3.3* 3.9 3.9 3.8  CL 99  --  98 99 97* 104  CO2 22  --  25 26 25 27   GLUCOSE 184*  --  120* 113* 109* 249*  BUN 16  --  14 13 16 19   CREATININE 0.90 0.86 0.83 0.83 0.82 0.92  CALCIUM 9.5  --  8.5* 9.1 8.7* 8.0*    GFR: Estimated Creatinine Clearance: 44.1 mL/min (by C-G formula based on SCr of 0.92 mg/dL). Liver Function Tests: Recent Labs  Lab 09/27/23 2007  AST 23  ALT 28  ALKPHOS 91  BILITOT 0.6  PROT 7.8  ALBUMIN 3.7   Recent Labs  Lab 09/27/23 2007  LIPASE 41   No results for input(s): "HGBA1C" in the last 72 hours. CBG: Recent Labs  Lab 10/01/23 1603 10/01/23 2032 10/02/23 0027 10/02/23 0453 10/02/23 0747  GLUCAP 159* 140* 123* 118* 110*   Recent Results (from the past 240 hour(s))  Surgical pcr screen     Status: Abnormal   Collection Time: 10/01/23  4:19 AM   Specimen: Nasal Mucosa; Nasal Swab  Result Value Ref Range Status   MRSA, PCR POSITIVE (A) NEGATIVE Final    Comment: RESULT CALLED TO, READ BACK BY AND VERIFIED WITH: NGUYEN, T. RN AT [phone] ON 10/01/2023 BY MECIAL J.    Staphylococcus aureus POSITIVE (A) NEGATIVE Final    Comment: (NOTE) The Xpert SA Assay (FDA approved for NASAL specimens in patients 35 years of age and older), is one component of a comprehensive surveillance program. It is not intended to diagnose infection nor to guide or monitor treatment. Performed at [facility], [address], [place], [place] [address]     Antimicrobials: Anti-infectives (From admission, onward)    Start     Dose/Rate Route Frequency Ordered Stop   09/27/23 2315  piperacillin-tazobactam (ZOSYN) IVPB 3.375 g        3.375 g 12.5 mL/hr over 240 Minutes Intravenous Every 8 hours 09/27/23 2305        Culture/Microbiology    Component Value Date/Time   SDES  09/20/2023 1930    BLOOD BLOOD RIGHT HAND Performed at [facility], [address] [place], [place] [address]    SPECREQUEST  09/20/2023 1930    BOTTLES DRAWN AEROBIC AND ANAEROBIC Blood Culture results may not be optimal due to an inadequate volume of blood received in culture bottles Performed at [facility], [address],  [place], [place] [address]    CULT  09/20/2023 1930    NO GROWTH 5 DAYS Performed at [facility], [address] [place],  [place] [address]    REPTSTATUS 09/25/2023 FINAL 09/20/2023 1930  Radiology Studies: ECHOCARDIOGRAM COMPLETE  Result Date: 10/01/2023    ECHOCARDIOGRAM REPORT   Patient Name:   Elizabeth Odom Date of Exam: 10/01/2023 Medical Rec #:  [id number]      Height:       66.0 in Accession #:    [id number]     Weight:       124.0 lb Date of Birth:  November 23, 1941     BSA:          1.632 m Patient Age:    81 years       BP:           120/54 mmHg Patient Gender: F              HR:           86 bpm. Exam Location:  Inpatient Procedure: 2D Echo, Cardiac Doppler and Color Doppler Indications:    Chest Pain R07.9  History:        Patient has prior history of Echocardiogram examinations, most                 recent 04/10/2016. Arrythmias:Atrial Fibrillation; Risk                 Factors:Non-Smoker.  Sonographer:    Dondra Prader RVT RCS Referring Phys: [id number] [facility]  Sonographer Comments: Patient refused to reposition; exam done with patient supine. IMPRESSIONS  1. Left ventricular ejection fraction, by estimation, is 70 to 75%. The left ventricle has hyperdynamic function. The left ventricle has no regional wall motion abnormalities. There is mild concentric left ventricular hypertrophy. Left ventricular diastolic parameters are consistent with Grade I diastolic dysfunction (impaired relaxation).  2. Right ventricular systolic function is normal. The right ventricular size is normal.  3. The mitral valve is normal in structure. Trivial mitral valve regurgitation. No evidence of mitral stenosis.  4. The aortic valve is tricuspid. There is mild calcification of the aortic valve. Aortic valve regurgitation is not visualized. Aortic valve sclerosis/calcification is present, without any evidence of aortic stenosis.  5. The inferior vena cava is normal in size with greater than 50% respiratory variability,  suggesting right atrial pressure of 3 mmHg. FINDINGS  Left Ventricle: Left ventricular ejection fraction, by estimation, is 70 to 75%. The left ventricle has hyperdynamic function. The left ventricle has no regional wall motion abnormalities. The left ventricular internal cavity size was normal in size. There is mild concentric left ventricular hypertrophy. Left ventricular diastolic parameters are consistent with Grade I diastolic dysfunction (impaired relaxation). Right Ventricle: The right ventricular size is normal. No increase in right ventricular wall thickness. Right ventricular systolic function is normal. Left Atrium: Left atrial size was normal in size. Right Atrium: Right atrial size was normal in size. Pericardium: There is no evidence of pericardial effusion. Mitral Valve: The mitral valve is normal in structure. Trivial mitral valve regurgitation. No evidence of mitral valve stenosis. Tricuspid Valve: The tricuspid valve is normal in structure. Tricuspid valve regurgitation is trivial. No evidence of tricuspid stenosis. Aortic Valve: The aortic valve is tricuspid. There is mild calcification of the aortic valve. Aortic valve regurgitation is not visualized. Aortic valve sclerosis/calcification is present, without any evidence of aortic stenosis. Aortic valve mean gradient measures 3.0 mmHg. Aortic valve peak gradient measures 5.6 mmHg. Aortic valve area, by VTI measures 2.71 cm. Pulmonic Valve: The pulmonic valve was normal in structure. Pulmonic valve regurgitation is not visualized.  No evidence of pulmonic stenosis. Aorta: The aortic root is normal in size and structure. Venous: The inferior vena cava is normal in size with greater than 50% respiratory variability, suggesting right atrial pressure of 3 mmHg. IAS/Shunts: No atrial level shunt detected by color flow Doppler.  LEFT VENTRICLE PLAX 2D LVIDd:         3.70 cm   Diastology LVIDs:         2.10 cm   LV e' medial:    4.13 cm/s LV PW:          0.90 cm   LV E/e' medial:  16.0 LV IVS:        1.00 cm   LV e' lateral:   11.00 cm/s LVOT diam:     2.10 cm   LV E/e' lateral: 6.0 LV SV:         54 LV SV Index:   33 LVOT Area:     3.46 cm  RIGHT VENTRICLE RV S prime:     18.30 cm/s LEFT ATRIUM         Index LA diam:    2.70 cm 1.65 cm/m  AORTIC VALVE                    PULMONIC VALVE AV Area (Vmax):    2.68 cm     PV Vmax:       1.17 m/s AV Area (Vmean):   2.49 cm     PV Peak grad:  5.5 mmHg AV Area (VTI):     2.71 cm AV Vmax:           118.00 cm/s AV Vmean:          79.900 cm/s AV VTI:            0.198 m AV Peak Grad:      5.6 mmHg AV Mean Grad:      3.0 mmHg LVOT Vmax:         91.20 cm/s LVOT Vmean:        57.500 cm/s LVOT VTI:          0.155 m LVOT/AV VTI ratio: 0.78  AORTA Ao Root diam: 3.20 cm Ao Asc diam:  2.90 cm MITRAL VALVE MV Area (PHT): 3.72 cm    SHUNTS MV Decel Time: 204 msec    Systemic VTI:  0.16 m MV E velocity: 66.00 cm/s  Systemic Diam: 2.10 cm MV A velocity: 90.80 cm/s MV E/A ratio:  0.73 Arvilla Meres MD Electronically signed by Arvilla Meres MD Signature Date/Time: 10/01/2023/3:06:21 PM    Final    CT CHEST W CONTRAST  Addendum Date: 10/01/2023   ADDENDUM REPORT: 10/01/2023 13:00 ADDENDUM: Upon re-review of CT scan chest with contrast performed yesterday (09/30/2023), the following findings are noted: There are several areas of nonocclusive filling defects in the segmental pulmonary artery branches to right upper lobe, right lower lobe and left upper lobe (marked with electronic arrow sign on series 2), compatible with acute small-to-moderate volume pulmonary embolism. No evidence of right heart strain or lung infarction. " Critical Value/emergent results were called by telephone at the time of interpretation on 10/01/2023 at 12:58 pm to provider Dr. Jonathon Bellows, Dayna Barker , who verbally acknowledged these results. Electronically Signed   By: Jules Schick M.D.   On: 10/01/2023 13:00   Result Date: 10/01/2023 CLINICAL DATA:  Colon  cancer, staging. * Tracking Code: BO * EXAM: CT CHEST WITH CONTRAST TECHNIQUE: Multidetector CT imaging of the  chest was performed during intravenous contrast administration. RADIATION DOSE REDUCTION: This exam was performed according to the departmental dose-optimization program which includes automated exposure control, adjustment of the mA and/or kV according to patient size and/or use of iterative reconstruction technique. CONTRAST:  75mL OMNIPAQUE IOHEXOL 300 MG/ML  SOLN COMPARISON:  None Available. FINDINGS: Cardiovascular:  No pericardial effusion. No aortic aneurysm. Mediastinum/Nodes: There is a 1.4 x 1.9 cm hypoattenuating nodule in the inferior aspect of the isthmus, not well characterized on the current exam. The nodule meets the size criteria for follow-up nonemergent evaluation with thyroid ultrasound. No solid / cystic mediastinal masses. The esophagus is nondistended precluding optimal assessment. No axillary, mediastinal or hilar lymphadenopathy by size criteria. Lungs/Pleura: The central tracheo-bronchial tree is patent. There are patchy areas of linear, plate-like atelectasis and/or scarring throughout bilateral lungs. No mass or consolidation. No pleural effusion or pneumothorax. No suspicious lung nodules. Upper Abdomen: Visualized upper abdominal viscera within normal limits. Musculoskeletal: The visualized soft tissues of the chest wall are grossly unremarkable. No suspicious osseous lesions. There are mild multilevel degenerative changes in the visualized spine. IMPRESSION: 1. No evidence of metastatic disease in the chest. 2. 1.4 x 1.9 cm hypoattenuating nodule in the inferior aspect of the isthmus, not well characterized on the current exam. The nodule meets the size criteria for follow-up nonemergent evaluation with thyroid ultrasound. Electronically Signed: By: Jules Schick M.D. On: 09/30/2023 15:22     LOS: 5 days   Lanae Boast, MD [facility]  10/02/2023, 10:21 AM

## 2023-10-02 NOTE — Evaluation (Signed)
Physical Therapy Evaluation Patient Details Name: Elizabeth Odom MRN: [id number] DOB: 01/29/1941 Today's Date: 10/02/2023  History of Present Illness  82 yo female admitted with acute colitis. S/P Hartmann's with colostomy 10/21. Hx of Afib, RLS, falls, syncope, collapse, cervical Ca, cervical spine fxs, anemia  Clinical Impression  On eval, pt required Min A for mobility. She walked ~60 feet with a RW. Moderate pain with activity. Pain rated 8/10 even after IV pain meds. Encouraged pt to try to start to mobilize more with nursing and/or mobility team. Will continue to follow and progress activity as tolerated.         If plan is discharge home, recommend the following: A little help with walking and/or transfers;A little help with bathing/dressing/bathroom;Assistance with cooking/housework;Assist for transportation;Help with stairs or ramp for entrance   Can travel by private vehicle        Equipment Recommendations None recommended by PT  Recommendations for Other Services  OT consult    Functional Status Assessment Patient has had a recent decline in their functional status and demonstrates the ability to make significant improvements in function in a reasonable and predictable amount of time.     Precautions / Restrictions Precautions Precautions: Fall Precaution Comments: abd surgery; NG tube; colostomy Restrictions Weight Bearing Restrictions: No      Mobility  Bed Mobility Overal bed mobility: Needs Assistance Bed Mobility: Rolling, Sidelying to Sit, Sit to Sidelying Rolling: Contact guard assist Sidelying to sit: Min assist     Sit to sidelying: Min assist General bed mobility comments: Cues provided. Increased time. Assist for trunk and LEs as needed.    Transfers Overall transfer level: Needs assistance Equipment used: Rolling walker (2 wheels) Transfers: Sit to/from Stand Sit to Stand: Min assist           General transfer comment: Assist to rise,  steady, control descent. Cues for safety, hand placement. Increased time.    Ambulation/Gait Ambulation/Gait assistance: Min assist Gait Distance (Feet): 60 Feet Assistive device: Rolling walker (2 wheels) Gait Pattern/deviations: Step-through pattern, Decreased stride length       General Gait Details: Slow gait speed. Small amount of assist to steady. Tolerated distance well.  Stairs            Wheelchair Mobility     Tilt Bed    Modified Rankin (Stroke Patients Only)       Balance Overall balance assessment: Needs assistance         Standing balance support: Reliant on assistive device for balance, Bilateral upper extremity supported, During functional activity Standing balance-Leahy Scale: Fair                               Pertinent Vitals/Pain Pain Assessment Pain Assessment: 0-10 Pain Score: 8  Pain Location: abdomen Pain Descriptors / Indicators: Grimacing, Operative site guarding Pain Intervention(s): Limited activity within patient's tolerance, Monitored during session, Repositioned    Home Living Family/patient expects to be discharged to:: Private residence Living Arrangements: Alone   Type of Home: Apartment Home Access: Level entry       Home Layout: One level Home Equipment: Cane - single Librarian, academic (2 wheels);Rollator (4 wheels)      Prior Function Prior Level of Function : Independent/Modified Independent                     Extremity/Trunk Assessment   Upper Extremity Assessment Upper Extremity Assessment: Defer  to OT evaluation    Lower Extremity Assessment Lower Extremity Assessment: Generalized weakness    Cervical / Trunk Assessment Cervical / Trunk Assessment: Normal  Communication   Communication Communication: No apparent difficulties  Cognition Arousal: Alert Behavior During Therapy: WFL for tasks assessed/performed Overall Cognitive Status: Within Functional Limits for tasks  assessed                                          General Comments      Exercises     Assessment/Plan    PT Assessment Patient needs continued PT services  PT Problem List Decreased strength;Decreased range of motion;Decreased activity tolerance;Decreased balance;Decreased mobility;Decreased knowledge of use of DME;Pain       PT Treatment Interventions DME instruction;Gait training;Functional mobility training;Therapeutic activities;Therapeutic exercise;Patient/family education;Balance training    PT Goals (Current goals can be found in the Care Plan section)  Acute Rehab PT Goals Patient Stated Goal: less pain PT Goal Formulation: With patient Time For Goal Achievement: 10/16/23 Potential to Achieve Goals: Good    Frequency Min 1X/week     Co-evaluation               AM-PAC PT "6 Clicks" Mobility  Outcome Measure Help needed turning from your back to your side while in a flat bed without using bedrails?: A Little Help needed moving from lying on your back to sitting on the side of a flat bed without using bedrails?: A Little Help needed moving to and from a bed to a chair (including a wheelchair)?: A Little Help needed standing up from a chair using your arms (e.g., wheelchair or bedside chair)?: A Little Help needed to walk in hospital room?: A Little Help needed climbing 3-5 steps with a railing? : A Lot 6 Click Score: 17    End of Session   Activity Tolerance: Patient tolerated treatment well;Patient limited by pain Patient left: in bed;with call bell/phone within reach;with bed alarm set   PT Visit Diagnosis: Difficulty in walking, not elsewhere classified (R26.2);Pain;Muscle weakness (generalized) (M62.81) Pain - part of body:  (abdomen)    Time: 8315-1761 PT Time Calculation (min) (ACUTE ONLY): 29 min   Charges:   PT Evaluation $PT Eval Low Complexity: 1 Low PT Treatments $Gait Training: 8-22 mins PT General Charges $$ ACUTE PT  VISIT: 1 Visit            Faye Ramsay, PT Acute Rehabilitation  Office: [phone]

## 2023-10-02 NOTE — Progress Notes (Signed)
PHARMACY - ANTICOAGULATION CONSULT NOTE  Pharmacy Consult for heparin  Indication: acute pulmonary embolus  Allergies  Allergen Reactions   Elemental Sulfur Anaphylaxis    Patient Measurements: Height: 5\' 6"  (167.6 cm) Weight: 61.1 kg (134 lb 11.2 oz) IBW/kg (Calculated) : 59.3 Heparin Dosing Weight: 61 kg  Vital Signs: Temp: 98.2 F (36.8 C) (10/22 1003) Temp Source: Oral (10/22 0456) BP: 113/52 (10/22 1003) Pulse Rate: 74 (10/22 1003)  Labs: Recent Labs    10/01/23 0326 10/02/23 0408  HGB 13.1 10.2*  HCT 40.3 31.4*  PLT 409* 314  HEPARINUNFRC  --  0.26*  CREATININE 0.82 0.92    Estimated Creatinine Clearance: 44.1 mL/min (by C-G formula based on SCr of 0.92 mg/dL).   Medical History: Past Medical History:  Diagnosis Date   A-fib (HCC) 04/09/2016   Acute head injury 04/09/2016   Cervical cancer (HCC)    Cervical spine fracture, initial encounter 04/09/2016   Essential hypertension    History of anemia    Multiple fractures of cervical spine (HCC) 04/09/2016   PAC (premature atrial contraction)    Restless leg    Syncope and collapse 04/09/2016    Assessment: Patient is an 82 y.o F who presented to the ED on 09/27/23 with c/o abdominal pain. Abdominal CT on 09/26/26 showed, " wall thickening of the proximal sigmoid colon worrisome for neoplasm" and "colonic dilatation with air-fluid levels and large amount of stool compatible with obstruction." She underwent sigmoidoscopy on 09/29/23 but GI team was not able to proceed/advance to intended area. Patient was then taken to the OR on 10/01/23 for exp lap, sigmoid colectomy and small bowel resection. Updated chest CT from 09/30/23 came back on 10/01/23 with findings consistent with "acute small-to-moderate volume pulmonary embolism."  Heparin drip started at 6p on 10/01/23 s/p OR (no bolus per MD request).   Today, 10/02/2023: - heparin level collected at 1p is therapeutic at 0.57 - hgb down 10.2, plts 314K - pt's  RN reported, "little bloody drainage on her colostomy pouch."  Goal of Therapy:  Heparin level 0.3-0.7 units/ml Monitor platelets by anticoagulation protocol: Yes   Plan:  - continue heparin drip at 1100 units/hr - daily heparin level  - monitor for s/sx bleeding   Ranessa Kosta P 10/02/2023,11:52 AM

## 2023-10-02 NOTE — Progress Notes (Signed)
PHARMACY - ANTICOAGULATION CONSULT NOTE  Pharmacy Consult for Heparin Indication: pulmonary embolus  Allergies  Allergen Reactions   Elemental Sulfur Anaphylaxis    Patient Measurements: Height: 5\' 6"  (167.6 cm) Weight: 56.2 kg (124 lb) IBW/kg (Calculated) : 59.3 Heparin Dosing Weight:  56.2 kg  Vital Signs: Temp: 98.3 F (36.8 C) (10/22 0456) Temp Source: Oral (10/22 0456) BP: 122/54 (10/22 0456) Pulse Rate: 68 (10/22 0456)  Labs: Recent Labs    09/29/23 0647 10/01/23 0326 10/02/23 0408  HGB 13.0 13.1 10.2*  HCT 39.5 40.3 31.4*  PLT 512* 409* 314  HEPARINUNFRC  --   --  0.26*  CREATININE 0.83 0.82 0.92    Estimated Creatinine Clearance: 41.8 mL/min (by C-G formula based on SCr of 0.92 mg/dL).   Medical History: Past Medical History:  Diagnosis Date   A-fib (HCC) 04/09/2016   Acute head injury 04/09/2016   Cervical cancer (HCC)    Cervical spine fracture, initial encounter 04/09/2016   Essential hypertension    History of anemia    Multiple fractures of cervical spine (HCC) 04/09/2016   PAC (premature atrial contraction)    Restless leg    Syncope and collapse 04/09/2016   Assessment: 82 yo F with acute small-to-moderate volume pulmonary embolism found on chest CT 10/20. No evidence of right heart strain or lung infarction.  She is not on anticoagulation PTA but was on Lovenox px dose since admission.  She is s/p sigmoid colectomy 10/21.  Pharmacy consulted to start IV heparin post-op without bolus.  - Baseline Hgb 13.1, Plts 409  10/02/2023: Heparin level 0.26- subtherapeutic on IV heparin 1000 units/hr CBC: Hg 10.2-low post-op, pltc 314 No infusion related concerns reported by RN.   RN reports small amount (<5cc) of blood noted in new colostomy.  Goal of Therapy:  Heparin level 0.3-0.7 units/ml Monitor platelets by anticoagulation protocol: Yes   Plan:  Increase IV heparin to 1100 units/hr .  No bolus per MD. Check heparin level 8 hrs after heparin  rate increase Daily HL and CBC F/U long-term anticoagulation plan  Junita Push PharmD 10/02/2023,4:59 AM

## 2023-10-02 NOTE — Progress Notes (Signed)
OT Cancellation Note  Patient Details Name: Elizabeth Odom MRN: [id number] DOB: March 03, 1941   Cancelled Treatment:    Reason Eval/Treat Not Completed: Pain limiting ability to participate Patient is in bed with head ache and incisional pain per nurse with just having gotten pain medications. OT to continue to follow and check back as schedule will allow.  Rosalio Loud, MS Acute Rehabilitation Department Office# [phone]  10/02/2023, 3:46 PM

## 2023-10-02 NOTE — Progress Notes (Signed)
1 Day Post-Op   Subjective/Chief Complaint: Doing well with some expected abdominal pain   Objective: Vital signs in last 24 hours: Temp:  [97.7 F (36.5 C)-98.9 F (37.2 C)] 98.3 F (36.8 C) (10/22 0456) Pulse Rate:  [68-76] 68 (10/22 0456) Resp:  [15-28] 17 (10/22 0456) BP: (109-151)/(52-68) 122/54 (10/22 0456) SpO2:  [81 %-100 %] 98 % (10/22 0456) Weight:  [61.1 kg] 61.1 kg (10/22 0500) Last BM Date : 09/27/23  Intake/Output from previous day: 10/21 0701 - 10/22 0700 In: 2236.5 [I.V.:1886.5; IV Piggyback:350] Out: 1653 [Urine:800; Emesis/NG output:350; Stool:3; Blood:500] Intake/Output this shift: No intake/output data recorded.  General appearance: alert and cooperative GI: inc c/d/I, and packed, ostomy patent  Lab Results:  Recent Labs    10/01/23 0326 10/02/23 0408  WBC 11.3* 13.7*  HGB 13.1 10.2*  HCT 40.3 31.4*  PLT 409* 314   BMET Recent Labs    10/01/23 0326 10/02/23 0408  NA 133* 137  K 3.9 3.8  CL 97* 104  CO2 25 27  GLUCOSE 109* 249*  BUN 16 19  CREATININE 0.82 0.92  CALCIUM 8.7* 8.0*   PT/INR No results for input(s): "LABPROT", "INR" in the last 72 hours. ABG No results for input(s): "PHART", "HCO3" in the last 72 hours.  Invalid input(s): "PCO2", "PO2"  Studies/Results: ECHOCARDIOGRAM COMPLETE  Result Date: 10/01/2023    ECHOCARDIOGRAM REPORT   Patient Name:   Elizabeth Odom Date of Exam: 10/01/2023 Medical Rec #:  [id number]      Height:       66.0 in Accession #:    [id number]     Weight:       124.0 lb Date of Birth:  [birth year]     BSA:          1.632 m Patient Age:    81 years       BP:           120/54 mmHg Patient Gender: F              HR:           86 bpm. Exam Location:  Inpatient Procedure: 2D Echo, Cardiac Doppler and Color Doppler Indications:    Chest Pain R07.9  History:        Patient has prior history of Echocardiogram examinations, most                 recent 04/10/2016. Arrythmias:Atrial Fibrillation; Risk                  Factors:Non-Smoker.  Sonographer:    Dondra Prader RVT RCS Referring Phys: [id number] [facility]  Sonographer Comments: Patient refused to reposition; exam done with patient supine. IMPRESSIONS  1. Left ventricular ejection fraction, by estimation, is 70 to 75%. The left ventricle has hyperdynamic function. The left ventricle has no regional wall motion abnormalities. There is mild concentric left ventricular hypertrophy. Left ventricular diastolic parameters are consistent with Grade I diastolic dysfunction (impaired relaxation).  2. Right ventricular systolic function is normal. The right ventricular size is normal.  3. The mitral valve is normal in structure. Trivial mitral valve regurgitation. No evidence of mitral stenosis.  4. The aortic valve is tricuspid. There is mild calcification of the aortic valve. Aortic valve regurgitation is not visualized. Aortic valve sclerosis/calcification is present, without any evidence of aortic stenosis.  5. The inferior vena cava is normal in size with greater than 50% respiratory variability, suggesting right atrial pressure of 3 mmHg.  FINDINGS  Left Ventricle: Left ventricular ejection fraction, by estimation, is 70 to 75%. The left ventricle has hyperdynamic function. The left ventricle has no regional wall motion abnormalities. The left ventricular internal cavity size was normal in size. There is mild concentric left ventricular hypertrophy. Left ventricular diastolic parameters are consistent with Grade I diastolic dysfunction (impaired relaxation). Right Ventricle: The right ventricular size is normal. No increase in right ventricular wall thickness. Right ventricular systolic function is normal. Left Atrium: Left atrial size was normal in size. Right Atrium: Right atrial size was normal in size. Pericardium: There is no evidence of pericardial effusion. Mitral Valve: The mitral valve is normal in structure. Trivial mitral valve regurgitation. No evidence of mitral valve  stenosis. Tricuspid Valve: The tricuspid valve is normal in structure. Tricuspid valve regurgitation is trivial. No evidence of tricuspid stenosis. Aortic Valve: The aortic valve is tricuspid. There is mild calcification of the aortic valve. Aortic valve regurgitation is not visualized. Aortic valve sclerosis/calcification is present, without any evidence of aortic stenosis. Aortic valve mean gradient measures 3.0 mmHg. Aortic valve peak gradient measures 5.6 mmHg. Aortic valve area, by VTI measures 2.71 cm. Pulmonic Valve: The pulmonic valve was normal in structure. Pulmonic valve regurgitation is not visualized. No evidence of pulmonic stenosis. Aorta: The aortic root is normal in size and structure. Venous: The inferior vena cava is normal in size with greater than 50% respiratory variability, suggesting right atrial pressure of 3 mmHg. IAS/Shunts: No atrial level shunt detected by color flow Doppler.  LEFT VENTRICLE PLAX 2D LVIDd:         3.70 cm   Diastology LVIDs:         2.10 cm   LV e' medial:    4.13 cm/s LV PW:         0.90 cm   LV E/e' medial:  16.0 LV IVS:        1.00 cm   LV e' lateral:   11.00 cm/s LVOT diam:     2.10 cm   LV E/e' lateral: 6.0 LV SV:         54 LV SV Index:   33 LVOT Area:     3.46 cm  RIGHT VENTRICLE RV S prime:     18.30 cm/s LEFT ATRIUM         Index LA diam:    2.70 cm 1.65 cm/m  AORTIC VALVE                    PULMONIC VALVE AV Area (Vmax):    2.68 cm     PV Vmax:       1.17 m/s AV Area (Vmean):   2.49 cm     PV Peak grad:  5.5 mmHg AV Area (VTI):     2.71 cm AV Vmax:           118.00 cm/s AV Vmean:          79.900 cm/s AV VTI:            0.198 m AV Peak Grad:      5.6 mmHg AV Mean Grad:      3.0 mmHg LVOT Vmax:         91.20 cm/s LVOT Vmean:        57.500 cm/s LVOT VTI:          0.155 m LVOT/AV VTI ratio: 0.78  AORTA Ao Root diam: 3.20 cm Ao Asc diam:  2.90 cm MITRAL VALVE  MV Area (PHT): 3.72 cm    SHUNTS MV Decel Time: 204 msec    Systemic VTI:  0.16 m MV E velocity:  66.00 cm/s  Systemic Diam: 2.10 cm MV A velocity: 90.80 cm/s MV E/A ratio:  0.73 Arvilla Meres MD Electronically signed by Arvilla Meres MD Signature Date/Time: 10/01/2023/3:06:21 PM    Final    CT CHEST W CONTRAST  Addendum Date: 10/01/2023   ADDENDUM REPORT: 10/01/2023 13:00 ADDENDUM: Upon re-review of CT scan chest with contrast performed yesterday (09/30/2023), the following findings are noted: There are several areas of nonocclusive filling defects in the segmental pulmonary artery branches to right upper lobe, right lower lobe and left upper lobe (marked with electronic arrow sign on series 2), compatible with acute small-to-moderate volume pulmonary embolism. No evidence of right heart strain or lung infarction. " Critical Value/emergent results were called by telephone at the time of interpretation on 10/01/2023 at 12:58 pm to provider Dr. Jonathon Bellows, Dayna Barker , who verbally acknowledged these results. Electronically Signed   By: Jules Schick M.D.   On: 10/01/2023 13:00   Result Date: 10/01/2023 CLINICAL DATA:  Colon cancer, staging. * Tracking Code: BO * EXAM: CT CHEST WITH CONTRAST TECHNIQUE: Multidetector CT imaging of the chest was performed during intravenous contrast administration. RADIATION DOSE REDUCTION: This exam was performed according to the departmental dose-optimization program which includes automated exposure control, adjustment of the mA and/or kV according to patient size and/or use of iterative reconstruction technique. CONTRAST:  75mL OMNIPAQUE IOHEXOL 300 MG/ML  SOLN COMPARISON:  None Available. FINDINGS: Cardiovascular:  No pericardial effusion. No aortic aneurysm. Mediastinum/Nodes: There is a 1.4 x 1.9 cm hypoattenuating nodule in the inferior aspect of the isthmus, not well characterized on the current exam. The nodule meets the size criteria for follow-up nonemergent evaluation with thyroid ultrasound. No solid / cystic mediastinal masses. The esophagus is nondistended  precluding optimal assessment. No axillary, mediastinal or hilar lymphadenopathy by size criteria. Lungs/Pleura: The central tracheo-bronchial tree is patent. There are patchy areas of linear, plate-like atelectasis and/or scarring throughout bilateral lungs. No mass or consolidation. No pleural effusion or pneumothorax. No suspicious lung nodules. Upper Abdomen: Visualized upper abdominal viscera within normal limits. Musculoskeletal: The visualized soft tissues of the chest wall are grossly unremarkable. No suspicious osseous lesions. There are mild multilevel degenerative changes in the visualized spine. IMPRESSION: 1. No evidence of metastatic disease in the chest. 2. 1.4 x 1.9 cm hypoattenuating nodule in the inferior aspect of the isthmus, not well characterized on the current exam. The nodule meets the size criteria for follow-up nonemergent evaluation with thyroid ultrasound. Electronically Signed: By: Jules Schick M.D. On: 09/30/2023 15:22    Anti-infectives: Anti-infectives (From admission, onward)    Start     Dose/Rate Route Frequency Ordered Stop   09/27/23 2315  piperacillin-tazobactam (ZOSYN) IVPB 3.375 g        3.375 g 12.5 mL/hr over 240 Minutes Intravenous Every 8 hours 09/27/23 2305         Assessment/Plan: POD 1 s/p ex lap, Harmtman's resection and colostomy, and small bowel resection PE -con't NGT 2-3d to allow SBR to heal -start WTD dressing change tomorrow -PT -Con't heparin gtt   LOS: 5 days    Axel Filler 10/02/2023

## 2023-10-03 DIAGNOSIS — K529 Noninfective gastroenteritis and colitis, unspecified: Secondary | ICD-10-CM | POA: Diagnosis not present

## 2023-10-03 LAB — PHOSPHORUS: Phosphorus: 2 mg/dL — ABNORMAL LOW (ref 2.5–4.6)

## 2023-10-03 LAB — CBC
HCT: 25.8 % — ABNORMAL LOW (ref 36.0–46.0)
Hemoglobin: 8.6 g/dL — ABNORMAL LOW (ref 12.0–15.0)
MCH: 31.5 pg (ref 26.0–34.0)
MCHC: 33.3 g/dL (ref 30.0–36.0)
MCV: 94.5 fL (ref 80.0–100.0)
Platelets: 325 10*3/uL (ref 150–400)
RBC: 2.73 MIL/uL — ABNORMAL LOW (ref 3.87–5.11)
RDW: 13.2 % (ref 11.5–15.5)
WBC: 16.8 10*3/uL — ABNORMAL HIGH (ref 4.0–10.5)
nRBC: 0 % (ref 0.0–0.2)

## 2023-10-03 LAB — GLUCOSE, CAPILLARY
Glucose-Capillary: 142 mg/dL — ABNORMAL HIGH (ref 70–99)
Glucose-Capillary: 152 mg/dL — ABNORMAL HIGH (ref 70–99)
Glucose-Capillary: 157 mg/dL — ABNORMAL HIGH (ref 70–99)
Glucose-Capillary: 161 mg/dL — ABNORMAL HIGH (ref 70–99)
Glucose-Capillary: 163 mg/dL — ABNORMAL HIGH (ref 70–99)
Glucose-Capillary: 167 mg/dL — ABNORMAL HIGH (ref 70–99)

## 2023-10-03 LAB — BASIC METABOLIC PANEL
Anion gap: 8 (ref 5–15)
BUN: 30 mg/dL — ABNORMAL HIGH (ref 8–23)
CO2: 24 mmol/L (ref 22–32)
Calcium: 8.1 mg/dL — ABNORMAL LOW (ref 8.9–10.3)
Chloride: 105 mmol/L (ref 98–111)
Creatinine, Ser: 0.59 mg/dL (ref 0.44–1.00)
GFR, Estimated: >60 mL/min (ref >60)
Glucose, Bld: 176 mg/dL — ABNORMAL HIGH (ref 70–99)
Potassium: 4.3 mmol/L (ref 3.5–5.1)
Sodium: 137 mmol/L (ref 135–145)

## 2023-10-03 LAB — HEPARIN LEVEL (UNFRACTIONATED)
Heparin Unfractionated: 0.66 [IU]/mL (ref 0.30–0.70)
Heparin Unfractionated: 0.59 [IU]/mL (ref 0.30–0.70)

## 2023-10-03 LAB — SURGICAL PATHOLOGY

## 2023-10-03 LAB — MAGNESIUM: Magnesium: 1.8 mg/dL (ref 1.7–2.4)

## 2023-10-03 LAB — HEMOGLOBIN AND HEMATOCRIT, BLOOD
HCT: 21.4 % — ABNORMAL LOW (ref 36.0–46.0)
Hemoglobin: 7 g/dL — ABNORMAL LOW (ref 12.0–15.0)

## 2023-10-03 MED ORDER — SODIUM CHLORIDE 0.9 % IV BOLUS
500.0000 mL | Freq: Once | INTRAVENOUS | Status: AC
  Administered 2023-10-03: 500 mL via INTRAVENOUS

## 2023-10-03 NOTE — Plan of Care (Signed)
  Problem: Education: Goal: Knowledge of General Education information will improve Description: Including pain rating scale, medication(s)/side effects and non-pharmacologic comfort measures Outcome: Progressing   Problem: Activity: Goal: Risk for activity intolerance will decrease Outcome: Progressing   Problem: Pain Managment: Goal: General experience of comfort will improve Outcome: Progressing   

## 2023-10-03 NOTE — Evaluation (Signed)
Occupational Therapy Evaluation Patient Details Name: Elizabeth Odom MRN: [id number] DOB: [birth year] Today's Date: 10/03/2023   History of Present Illness 82 yr old female admitted with acute colitis. S/P Hartmann's with colostomy 10/01/23. Hx of Afib, RLS, syncope, cervical CA, cervical spine fxs, anemia   Clinical Impression   The pt is currently presenting significantly below her baseline level of functioning for ADL management, as she is limited by the below listed deficits (see OT problem list). At current, the pt requires assist for tasks, including dressing, toileting, and bed mobility. During the therapy session today, she reported 8/10 abdominal pain and presented with associated activity tolerance limitations and guarded movements. Once seated EOB, she presented with occasional R sided leaning, requiring assist to correct. She deferred attempts at out of bed activity, given increased pain. She will benefit from further OT services to maximize her ADL performance & to decrease the risk for further weakness and deconditioning. At current, she would not be safe to return home alone. As such, short-term SNF rehab may be warranted, unless the pt demonstrates improved overall functional abilities. She does however prefer to return home with home therapy at discharge.       If plan is discharge home, recommend the following: A lot of help with bathing/dressing/bathroom;Assistance with cooking/housework;Assist for transportation;A lot of help with walking and/or transfers    Functional Status Assessment  Patient has had a recent decline in their functional status and demonstrates the ability to make significant improvements in function in a reasonable and predictable amount of time.  Equipment Recommendations  Other (comment) (to be determined pending functional progress)    Recommendations for Other Services       Precautions / Restrictions Restrictions Weight Bearing Restrictions:  No Other Position/Activity Restrictions: abdominal surgery, colostomy      Mobility Bed Mobility Overal bed mobility: Needs Assistance Bed Mobility: Supine to Sit, Sit to Supine     Supine to sit: Min assist Sit to supine: Mod assist        Transfers    General transfer comment: the pt deferred attempts at out of bed activity, given increased pain.      Balance       Sitting balance - Comments: CGA to min assist     Standing balance-Leahy Scale:  (unable to assess)         ADL either performed or assessed with clinical judgement   ADL Overall ADL's : Needs assistance/impaired Eating/Feeding: NPO Eating/Feeding Details (indicate cue type and reason): The pt is currently NPO, except for ice chips. She was observed self-feeding ice chips in bed with set-up assist. Grooming: Minimal assistance;Sitting Grooming Details (indicate cue type and reason): The pt required intermittent min assist to maintain sitting balance EOB, given intermittent R sided lean. She performed face washing and teeth brushing EOB.         Upper Body Dressing : Minimal assistance;Bed level Upper Body Dressing Details (indicate cue type and reason): based on clinical judgement Lower Body Dressing: Maximal assistance;Sitting/lateral leans       Toileting- Clothing Manipulation and Hygiene: Maximal assistance;Bed level Toileting - Clothing Manipulation Details (indicate cue type and reason): Pt required increased assist for use of bedpan at bed level.             Vision   Additional Comments: The pt correctly read the time depicted on the wall clock.            Pertinent Vitals/Pain Pain Assessment Pain Assessment: 0-10  Pain Score: 8  Pain Location: abdomen Pain Intervention(s): Limited activity within patient's tolerance, Monitored during session, Patient requesting pain meds-RN notified     Extremity/Trunk Assessment Upper Extremity Assessment Upper Extremity Assessment: LUE  deficits/detail;Right hand dominant;RUE deficits/detail RUE Deficits / Details: AROM WFL. Functional grip strength LUE Deficits / Details: AROM WFL. Functional grip strength   Lower Extremity Assessment Lower Extremity Assessment: Generalized weakness       Communication Communication Communication: No apparent difficulties   Cognition Arousal: Alert Behavior During Therapy: WFL for tasks assessed/performed Overall Cognitive Status: Within Functional Limits for tasks assessed          General Comments: Oriented x4, able to follow commands without difficulty                Home Living Family/patient expects to be discharged to:: Private residence Living Arrangements: Alone   Type of Home: Apartment Home Access: Level entry     Home Layout: One level               Home Equipment: Cane - single Librarian, academic (2 wheels);Rollator (4 wheels)          Prior Functioning/Environment Prior Level of Function : Independent/Modified Independent;Driving                 OT Problem List: Decreased activity tolerance;Decreased strength;Impaired balance (sitting and/or standing);Decreased knowledge of precautions;Pain      OT Treatment/Interventions: Self-care/ADL training;Therapeutic exercise;Energy conservation;DME and/or AE instruction;Therapeutic activities;Patient/family education;Balance training    OT Goals(Current goals can be found in the care plan section) Acute Rehab OT Goals Patient Stated Goal: to get better and to return to her prior level of functioning OT Goal Formulation: With patient Time For Goal Achievement: 10/17/23 Potential to Achieve Goals: Good ADL Goals Pt Will Perform Upper Body Dressing: with set-up;sitting Pt Will Perform Lower Body Dressing: with supervision;sit to/from stand Pt Will Transfer to Toilet: with supervision;ambulating;grab bars Pt Will Perform Toileting - Clothing Manipulation and hygiene: with supervision;sit  to/from stand  OT Frequency: Min 1X/week       AM-PAC OT "6 Clicks" Daily Activity     Outcome Measure Help from another person eating meals?: A Little Help from another person taking care of personal grooming?: A Little Help from another person toileting, which includes using toliet, bedpan, or urinal?: A Lot Help from another person bathing (including washing, rinsing, drying)?: A Lot Help from another person to put on and taking off regular upper body clothing?: A Little Help from another person to put on and taking off regular lower body clothing?: A Lot 6 Click Score: 15   End of Session Equipment Utilized During Treatment: Other (comment) Nurse Communication: Other (comment) (nurse cleared pt for therapy participation)  Activity Tolerance: Patient limited by pain Patient left: in bed;with call bell/phone within reach;with bed alarm set  OT Visit Diagnosis: Muscle weakness (generalized) (M62.81);Pain                Time: 3244-0102 OT Time Calculation (min): 38 min Charges:  OT General Charges $OT Visit: 1 Visit OT Evaluation $OT Eval Moderate Complexity: 1 Mod OT Treatments $Self Care/Home Management : 8-22 mins    Reuben Likes, OTR/L 10/03/2023, 3:47 PM

## 2023-10-03 NOTE — Progress Notes (Signed)
PHARMACY - ANTICOAGULATION CONSULT NOTE  Pharmacy Consult for heparin  Indication: acute pulmonary embolus  Allergies  Allergen Reactions   Elemental Sulfur Anaphylaxis    Patient Measurements: Height: 5\' 6"  (167.6 cm) Weight: 64.5 kg (142 lb 3.2 oz) IBW/kg (Calculated) : 59.3 Heparin Dosing Weight: 61 kg  Vital Signs: Temp: 98.2 F (36.8 C) (10/23 0602) Temp Source: Oral (10/23 0426) BP: 115/58 (10/23 0602) Pulse Rate: 97 (10/23 0602)  Labs: Recent Labs    10/01/23 0326 10/02/23 0408 10/02/23 1238 10/02/23 1944 10/03/23 0345  HGB 13.1 10.2*  --   --  8.6*  HCT 40.3 31.4*  --   --  25.8*  PLT 409* 314  --   --  325  HEPARINUNFRC  --  0.26* 0.57  --  0.66  CREATININE 0.82 0.92  --  0.68 0.59    Estimated Creatinine Clearance: 50.8 mL/min (by C-G formula based on SCr of 0.59 mg/dL).   Medical History: Past Medical History:  Diagnosis Date   A-fib (HCC) 04/09/2016   Acute head injury 04/09/2016   Cervical cancer (HCC)    Cervical spine fracture, initial encounter 04/09/2016   Essential hypertension    History of anemia    Multiple fractures of cervical spine (HCC) 04/09/2016   PAC (premature atrial contraction)    Restless leg    Syncope and collapse 04/09/2016    Assessment: Patient is an 82 y.o F who presented to the ED on 09/27/23 with c/o abdominal pain. Abdominal CT on 09/26/26 showed, " wall thickening of the proximal sigmoid colon worrisome for neoplasm" and "colonic dilatation with air-fluid levels and large amount of stool compatible with obstruction." She underwent sigmoidoscopy on 09/29/23, but GI team was not able to proceed/advance to intended area. Patient was then taken to the OR on 10/01/23 for exp lap, sigmoid colectomy and small bowel resection. Updated chest CT from 09/30/23 came back on 10/01/23 with findings consistent with "acute small-to-moderate volume pulmonary embolism."  Heparin drip started at 6p on 10/01/23 s/p OR (no bolus per MD  request).   Today, 10/03/2023: - heparin level collected at 3:45a was therapeutic at 0.66, but increased to the upper end of range. Repeat level at 11a remains therapeutic at 0.59 - hgb down 8.6, plts ok - pt's RN reported, "little bloody drainage on her colostomy pouch."  Goal of Therapy:  Heparin level 0.3-0.7 units/ml Monitor platelets by anticoagulation protocol: Yes   Plan:  - continue heparin drip at 1100 units/hr - daily heparin level  - monitor for s/sx bleeding   Quinesha Selinger P 10/03/2023,8:12 AM

## 2023-10-03 NOTE — Plan of Care (Signed)
  Problem: Coping: Goal: Level of anxiety will decrease Outcome: Progressing   Problem: Pain Managment: Goal: General experience of comfort will improve Outcome: Progressing   Problem: Safety: Goal: Ability to remain free from injury will improve Outcome: Progressing   

## 2023-10-03 NOTE — TOC Progression Note (Signed)
Transition of Care [facility]) - Progression Note   Patient Details  Name: Elizabeth Odom MRN: [id number] Date of Birth: 06-May-1941  Transition of [facility]) CM/SW Contact  Ewing Schlein, LCSW Phone Number: 10/03/2023, 1:34 PM  Clinical Narrative: CSW updated patient regarding HH being set up with [facility].  Expected Discharge Plan: Home w Home Health Services Barriers to Discharge: Continued Medical Work up  Expected Discharge Plan and Services In-house Referral: Clinical Social Work Post Acute Care Choice: Home Health Living arrangements for the past 2 months: Independent Living Facility, Apartment           DME Arranged: N/A DME Agency: NA HH Arranged: PT, RN HH Agency: [facility] ([facility] Date HH Agency Contacted: 10/02/23 Time HH Agency Contacted: 1550 Representative [facility] Agency: Morrie Sheldon  Social Determinants of Health (SDOH) Interventions SDOH Screenings   Food Insecurity: No Food Insecurity (09/28/2023)  Housing: Low Risk  (09/28/2023)  Transportation Needs: No Transportation Needs (09/28/2023)  Utilities: Not At Risk (09/28/2023)  Tobacco Use: Low Risk  (10/01/2023)   Readmission Risk Interventions     No data to display

## 2023-10-03 NOTE — Consult Note (Signed)
WOC Nurse ostomy consult note Stoma type/location: LLQ, end colostomy Stomal assessment/size: pink, moist, os at center, 1 3/4" round Peristomal assessment: intact  Treatment options for stomal/peristomal skin: 2" skin barrier ring Output bloody Ostomy pouching: 2pc. 2 3/4" with 2" barrier ring  Education provided: met with patient, she lives alone in an apt. She does not have family close by per patient. 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  We discussed that in order for her to go home along she MUST be able to empty her pouch independently.  It may be that friends in her apt community can cut her skin barriers ahead of time for her that way she would be able to place pouch, however she will NOT be able to cut a new skin barrier with her contractures.  She will face challenges with independence with ostomy care, suggested short term rehab until she was independent.    Please consider HHRN at minimum if patient can learn to empty prior to DC.     Enrolled patient in [place] Secure Start DC program: Yes  WOC Nurse will follow along with you for continued support with ostomy teaching and care Oday Ridings [facility] MSN, RN, [place], CNS, [place] [phone]

## 2023-10-03 NOTE — Progress Notes (Signed)
    Patient Name: DERIKA GUERRIERO           DOB: 05-Jun-1941  MRN: [id number]      Admission Date: 09/27/2023  Attending Provider: Lanae Boast, MD  Primary Diagnosis: Acute colitis   Level of care: Telemetry    CROSS COVER NOTE   Date of Service   10/03/2023   PORCIA BALDONADO, 82 y.o. female, was admitted on 09/27/2023 for Acute colitis.    HPI/Events of Note   Tachycardic, HR 140-160, reports of rhythm change as well On cardiac tele looks like afib vs aflutter, obtaining EKG now. Has Hx of PAF. Hemodynamically stable, asymptomatic  HR and rhythm have improved without medical intervention.    Interventions/ Plan   EKG- Sinus Tach with occasional PVC, no ST seg elevation Fluid Bolus, 500 cc  Labs- BMP, mag, phosph         Anthoney Harada, DNP, [employer] [facility] [place]

## 2023-10-03 NOTE — Plan of Care (Signed)
  Problem: Coping: Goal: Ability to adjust to condition or change in health will improve Outcome: Progressing   Problem: Coping: Goal: Level of anxiety will decrease Outcome: Progressing   Problem: Pain Managment: Goal: General experience of comfort will improve Outcome: Progressing   Problem: Safety: Goal: Ability to remain free from injury will improve Outcome: Progressing

## 2023-10-03 NOTE — Progress Notes (Signed)
2 Days Post-Op   Subjective/Chief Complaint: Doing well with some expected abdominal pain.  Ostomy is working.  She seems a bit confused at times this morning, but still able to answer most questions.   Objective: Vital signs in last 24 hours: Temp:  [98 F (36.7 C)-98.8 F (37.1 C)] 98.2 F (36.8 C) (10/23 0602) Pulse Rate:  [72-106] 97 (10/23 0602) Resp:  [14-17] 17 (10/23 0602) BP: (113-131)/(48-70) 115/58 (10/23 0602) SpO2:  [95 %-99 %] 96 % (10/23 0602) Weight:  [64.5 kg] 64.5 kg (10/23 0500) Last BM Date : 10/02/23 (colostomy bag)  Intake/Output from previous day: 10/22 0701 - 10/23 0700 In: 1542.1 [I.V.:1042.1; IV Piggyback:500] Out: 1500 [Urine:700; Emesis/NG output:250; Stool:550] Intake/Output this shift: No intake/output data recorded.  General appearance: alert and cooperative GI: inc c/d/I, and packed, ostomy patent with dark stool output.  Stoma is covered in stool.  NGT with dark bilious output  Lab Results:  Recent Labs    10/02/23 0408 10/03/23 0345  WBC 13.7* 16.8*  HGB 10.2* 8.6*  HCT 31.4* 25.8*  PLT 314 325   BMET Recent Labs    10/02/23 1944 10/03/23 0345  NA 135 137  K 4.0 4.3  CL 103 105  CO2 26 24  GLUCOSE 146* 176*  BUN 34* 30*  CREATININE 0.68 0.59  CALCIUM 7.8* 8.1*   PT/INR No results for input(s): "LABPROT", "INR" in the last 72 hours. ABG No results for input(s): "PHART", "HCO3" in the last 72 hours.  Invalid input(s): "PCO2", "PO2"  Studies/Results: ECHOCARDIOGRAM COMPLETE  Result Date: 10/01/2023    ECHOCARDIOGRAM REPORT   Patient Name:   NYKIA BELLMAN Date of Exam: 10/01/2023 Medical Rec #:  [id number]      Height:       66.0 in Accession #:    [id number]     Weight:       124.0 lb Date of Birth:  Dec 14, 1940     BSA:          1.632 m Patient Age:    81 years       BP:           120/54 mmHg Patient Gender: F              HR:           86 bpm. Exam Location:  Inpatient Procedure: 2D Echo, Cardiac Doppler and Color  Doppler Indications:    Chest Pain R07.9  History:        Patient has prior history of Echocardiogram examinations, most                 recent 04/10/2016. Arrythmias:Atrial Fibrillation; Risk                 Factors:Non-Smoker.  Sonographer:    Dondra Prader RVT RCS Referring Phys: [id number] [facility]  Sonographer Comments: Patient refused to reposition; exam done with patient supine. IMPRESSIONS  1. Left ventricular ejection fraction, by estimation, is 70 to 75%. The left ventricle has hyperdynamic function. The left ventricle has no regional wall motion abnormalities. There is mild concentric left ventricular hypertrophy. Left ventricular diastolic parameters are consistent with Grade I diastolic dysfunction (impaired relaxation).  2. Right ventricular systolic function is normal. The right ventricular size is normal.  3. The mitral valve is normal in structure. Trivial mitral valve regurgitation. No evidence of mitral stenosis.  4. The aortic valve is tricuspid. There is mild calcification of the aortic valve. Aortic valve  regurgitation is not visualized. Aortic valve sclerosis/calcification is present, without any evidence of aortic stenosis.  5. The inferior vena cava is normal in size with greater than 50% respiratory variability, suggesting right atrial pressure of 3 mmHg. FINDINGS  Left Ventricle: Left ventricular ejection fraction, by estimation, is 70 to 75%. The left ventricle has hyperdynamic function. The left ventricle has no regional wall motion abnormalities. The left ventricular internal cavity size was normal in size. There is mild concentric left ventricular hypertrophy. Left ventricular diastolic parameters are consistent with Grade I diastolic dysfunction (impaired relaxation). Right Ventricle: The right ventricular size is normal. No increase in right ventricular wall thickness. Right ventricular systolic function is normal. Left Atrium: Left atrial size was normal in size. Right Atrium: Right  atrial size was normal in size. Pericardium: There is no evidence of pericardial effusion. Mitral Valve: The mitral valve is normal in structure. Trivial mitral valve regurgitation. No evidence of mitral valve stenosis. Tricuspid Valve: The tricuspid valve is normal in structure. Tricuspid valve regurgitation is trivial. No evidence of tricuspid stenosis. Aortic Valve: The aortic valve is tricuspid. There is mild calcification of the aortic valve. Aortic valve regurgitation is not visualized. Aortic valve sclerosis/calcification is present, without any evidence of aortic stenosis. Aortic valve mean gradient measures 3.0 mmHg. Aortic valve peak gradient measures 5.6 mmHg. Aortic valve area, by VTI measures 2.71 cm. Pulmonic Valve: The pulmonic valve was normal in structure. Pulmonic valve regurgitation is not visualized. No evidence of pulmonic stenosis. Aorta: The aortic root is normal in size and structure. Venous: The inferior vena cava is normal in size with greater than 50% respiratory variability, suggesting right atrial pressure of 3 mmHg. IAS/Shunts: No atrial level shunt detected by color flow Doppler.  LEFT VENTRICLE PLAX 2D LVIDd:         3.70 cm   Diastology LVIDs:         2.10 cm   LV e' medial:    4.13 cm/s LV PW:         0.90 cm   LV E/e' medial:  16.0 LV IVS:        1.00 cm   LV e' lateral:   11.00 cm/s LVOT diam:     2.10 cm   LV E/e' lateral: 6.0 LV SV:         54 LV SV Index:   33 LVOT Area:     3.46 cm  RIGHT VENTRICLE RV S prime:     18.30 cm/s LEFT ATRIUM         Index LA diam:    2.70 cm 1.65 cm/m  AORTIC VALVE                    PULMONIC VALVE AV Area (Vmax):    2.68 cm     PV Vmax:       1.17 m/s AV Area (Vmean):   2.49 cm     PV Peak grad:  5.5 mmHg AV Area (VTI):     2.71 cm AV Vmax:           118.00 cm/s AV Vmean:          79.900 cm/s AV VTI:            0.198 m AV Peak Grad:      5.6 mmHg AV Mean Grad:      3.0 mmHg LVOT Vmax:         91.20 cm/s LVOT Vmean:  57.500 cm/s LVOT  VTI:          0.155 m LVOT/AV VTI ratio: 0.78  AORTA Ao Root diam: 3.20 cm Ao Asc diam:  2.90 cm MITRAL VALVE MV Area (PHT): 3.72 cm    SHUNTS MV Decel Time: 204 msec    Systemic VTI:  0.16 m MV E velocity: 66.00 cm/s  Systemic Diam: 2.10 cm MV A velocity: 90.80 cm/s MV E/A ratio:  0.73 Arvilla Meres MD Electronically signed by Arvilla Meres MD Signature Date/Time: 10/01/2023/3:06:21 PM    Final     Anti-infectives: Anti-infectives (From admission, onward)    Start     Dose/Rate Route Frequency Ordered Stop   09/27/23 2315  piperacillin-tazobactam (ZOSYN) IVPB 3.375 g  Status:  Discontinued        3.375 g 12.5 mL/hr over 240 Minutes Intravenous Every 8 hours 09/27/23 2305 10/02/23 1106       Assessment/Plan: POD 2, s/p ex lap, Harmtman's resection and colostomy, and small bowel resection, Dr. [name] 10/01/23 -con't NGT 2-3d to allow SBR to heal -start WTD dressing changes -PT, rec HHPT right now -heparin gtt for new PEs  FEN: NPO/NGT/IVFs VTE: heparin gtt ID: none currently needed  PEs - on heparin gtt A fib HTN anemia   LOS: 6 days    Letha Cape 10/03/2023

## 2023-10-03 NOTE — Progress Notes (Addendum)
PROGRESS NOTE Elizabeth Odom  XLK:[id number] DOB: [birth year] DOA: 09/27/2023 PCP: Elizabeth Pilon, FNP  Brief Narrative/Hospital Course: 82 year old female with history of A-fib, RLS, fall risk, anemia who was seen in the ED with last week on 09/20/23 for abdominal pain Suprapubic, decreased appetite and also fever and in ED had CT done that showed colitis and has been on Augmentin until 2 days PTA and was also told to increase her MiraLAX, the nshe had mild symptomatic improvement on 10/16 but had had severe recurrence of her pain and intolerance to oral intake and 3 episodes of nonbilious and nonbloody diarrhea so return to the ED on 09/27/2023. Patient reported diarrhea since he had been on antibiotics initially suprapubic pain now all over unable to keep anything down due to nausea vomiting.  Last colonoscopy probably 10 years ago. In the ED: Vital stable labs showed leukocytosis 15.5. CT ABD W/ contrast>>Wall thickening of the proximal sigmoid colon worrisome for neoplasm. Focal colitis not excluded. There is upstream colonic dilatation with air-fluid levels and large amount of stool compatible with obstruction. Small amount of free fluid in the pelvis. Fatty infiltration of the liver.  9 cm cystic lesion in the tail of the pancreas.  Subcentimeter lucent lesion in the L1 vertebral body indeterminate. Seen by GI- refused colonoscopy agreed for sigmoidoscopy> done 09/29/23>shows very tight structure versus mass in the rectosigmoid could not pass ultrathin colonoscope or even endoscope,  surgery consulted,  biopsies obtained but not sure if they will be adequate > CCS discussed over of uncertain situation tumor like a mass versus possible colitis versus possible ischemic colitis and after extensive discussion with patient and patient's family proceeding with surgery based on obstructive symptoms and finding.  Subjective: Seen and examined this morning. Resting comfortably NG tube on suction ostomy  working, Her daughter was at the bedside Able to answer most of the questions Afebrile overnight, was tachycardic EKG obtained  Labs shows hemoglobin drifting down and WBC trending up Renal function is stable Phos 2.0 Ng output 250 cc   Assessment and Plan: Principal Problem:   Acute colitis   Abdominal pain Large bowel obstruction due to recto-sigmoid/sigmoid stricture:? inflammatory versus mass Possible acute colitis and diarrhea: s/p sigmoidoscopy 09/29/23>showed very tight structure versus mass in the rectosigmoid could not pass ultrathin colonoscope or even endoscope  >subsequently surgery consulted who discussed about uncertain situation tumor like a mass versus possible colitis versus possible ischemic colitis and after extensive discussion, proceeded with sigmoid colectomy with colostomy creation 10/01/23. Remains n.p.o., awaiting return of bowel function, continue IV fluid hydration while n.p.o., pain control dressing change per CCS. encourage PT OT ambulation.  Surgery following  9 mm Pancreatic cyst: S/p MRI>showed pancreatic cyst in the pancreatic body/tail junction with differential considerations of pseudocyst or indolent cystic neoplasm. Per consensus criteria, this warrants follow-up with pre and post contrast abdominal MRI at 1 year.  Nodule in the inferior aspect of the isthmus will need thyroid ultrasound not emergently  Hyperglycemia Prediabetes with A1c 5.8: Blood sugar is well-controlled monitor Accu-Chek   Leukocytosis: Afebrile, question in the setting of postop status.  Check UA patient received Zosyn from 10/17 through 10/22- AND stopped per CCS. Recent Labs  Lab 09/28/23 0648 09/29/23 0647 10/01/23 0326 10/02/23 0408 10/03/23 0345  WBC 13.5* 12.0* 11.3* 13.7* 16.8*    Anemia normocytic: Initial hemoglobin likely hemoconcentration falsely high, Hb drifting, question ABLA in the setting of abdominal surgery, also on heparin drip. Monitor and transfuse  if less  than 7 g.  Recheck H&H later today and also do type and crossmatch. Recent Labs  Lab 09/28/23 0648 09/29/23 0647 10/01/23 0326 10/02/23 0408 10/03/23 0345  HGB 12.5 13.0 13.1 10.2* 8.6*  HCT 38.0 39.5 40.3 31.4* 25.8*    Hyponatremia: Hypokalemia: Electrolytes stable.  Monitor  Small to moderate-sized Acute PE w/o right heart strain: On review of CT chest from 10/20, found to have-nonocclusive filling defect in segmental pulmonary artery of right upper lobe right lower lobe and left upper lobe-compatible with acute small to moderate volume PE: Subsequently discussed with general surgery Dr. [name]  and started on heparin protocol without bolus.  Remains hemodynamically stable, echo surgery stable EF no right heart dysfunction.  Continue current heparin monitor CBC closely.Of note patient had been on prophylactic Lovenox for VTE prevention since admission.  Monitor in telemetry.   DVT prophylaxis: SCD's Start: 10/01/23 1141 Place TED hose Start: 10/01/23 1141 Code Status:   Code Status: Full Code Family Communication: plan of care discussed with patient/No family at bedside. Patient status is: Inpatient because of colitis/abdominal pain  Level of care: Telemetry  Dispo: The patient is from: Home, independent            Anticipated disposition: tbd Objective: Vitals last 24 hrs: Vitals:   10/02/23 2152 10/03/23 0426 10/03/23 0500 10/03/23 0602  BP: 131/70 114/69  (!) 115/58  Pulse: 84 (!) 106  97  Resp: 17 14  17   Temp: 98.1 F (36.7 C) 98 F (36.7 C)  98.2 F (36.8 C)  TempSrc: Oral Oral    SpO2: 97% 96%  96%  Weight:   64.5 kg   Height:       Weight change: 3.4 kg  Physical Examination: General exam: alert awake, oriented  on RA, ngt+ HEENT:Oral mucosa moist, Ear/Nose WNL grossly Respiratory system: Bilaterally clear BS,no use of accessory muscle Cardiovascular system: S1 & S2 +, No JVD. Gastrointestinal system: Abdomen soft, incision site clean dry intact  and packed ostomy patent with darker stool output  Ngt bilious drainage  nervous System: Alert, awake, moving all extremities,and following commands. Extremities: LE edema neg,distal peripheral pulses palpable and warm.  Skin: No rashes,no icterus. MSK: Normal muscle bulk,tone, power   Medications reviewed:  Scheduled Meds:  acidophilus  2 capsule Oral TID   bisacodyl  10 mg Rectal Once   Chlorhexidine Gluconate Cloth  6 each Topical Q0600   insulin aspart  0-9 Units Subcutaneous Q4H   mupirocin ointment  1 Application Nasal BID  Continuous Infusions:  dextrose 5 % and 0.9 % NaCl 75 mL/hr at 10/03/23 1112   heparin 1,100 Units/hr (10/03/23 1112)    Diet Order             Diet NPO time specified Except for: Ice Chips  Diet effective now                  Intake/Output Summary (Last 24 hours) at 10/03/2023 1130 Last data filed at 10/03/2023 1112 Gross per 24 hour  Intake 2113.74 ml  Output 1500 ml  Net 613.74 ml   Net IO Since Admission: 2,752.35 mL [10/03/23 1131]  Wt Readings from Last 3 Encounters:  10/03/23 64.5 kg  09/20/23 56.2 kg  11/23/20 55.3 kg     Unresulted Labs (From admission, onward)     Start     Ordered   10/03/23 1100  Heparin level (unfractionated)  Once-Timed,   TIMED        10/03/23 8413  10/03/23 1033  Urinalysis, Routine w reflex microscopic -Urine, Clean Catch  Once,   R       Question:  Specimen Source  Answer:  Urine, Clean Catch   10/03/23 1032   10/03/23 0500  Heparin level (unfractionated)  Daily,   R      10/02/23 1420   10/02/23 0500  CBC  Daily,   R      10/01/23 1328          Data Reviewed: I have personally reviewed following labs and imaging studies CBC: Recent Labs  Lab 09/28/23 0648 09/29/23 0647 10/01/23 0326 10/02/23 0408 10/03/23 0345  WBC 13.5* 12.0* 11.3* 13.7* 16.8*  NEUTROABS 10.9*  --   --   --   --   HGB 12.5 13.0 13.1 10.2* 8.6*  HCT 38.0 39.5 40.3 31.4* 25.8*  MCV 95.0 93.6 94.8 94.6 94.5  PLT 515*  512* 409* 314 325   Basic Metabolic Panel: Recent Labs  Lab 09/29/23 0647 10/01/23 0326 10/02/23 0408 10/02/23 1944 10/03/23 0345  NA 135 133* 137 135 137  K 3.9 3.9 3.8 4.0 4.3  CL 99 97* 104 103 105  CO2 26 25 27 26 24   GLUCOSE 113* 109* 249* 146* 176*  BUN 13 16 19  34* 30*  CREATININE 0.83 0.82 0.92 0.68 0.59  CALCIUM 9.1 8.7* 8.0* 7.8* 8.1*  MG  --   --   --   --  1.8  PHOS  --   --   --   --  2.0*   GFR: Estimated Creatinine Clearance: 50.8 mL/min (by C-G formula based on SCr of 0.59 mg/dL). Liver Function Tests: Recent Labs  Lab 09/27/23 2007  AST 23  ALT 28  ALKPHOS 91  BILITOT 0.6  PROT 7.8  ALBUMIN 3.7   Recent Labs  Lab 09/27/23 2007  LIPASE 41   No results for input(s): "HGBA1C" in the last 72 hours. CBG: Recent Labs  Lab 10/02/23 1527 10/02/23 2011 10/03/23 0039 10/03/23 0447 10/03/23 0746  GLUCAP 116* 131* 152* 167* 157*   Recent Results (from the past 240 hour(s))  Surgical pcr screen     Status: Abnormal   Collection Time: 10/01/23  4:19 AM   Specimen: Nasal Mucosa; Nasal Swab  Result Value Ref Range Status   MRSA, PCR POSITIVE (A) NEGATIVE Final    Comment: RESULT CALLED TO, READ BACK BY AND VERIFIED WITH: NGUYEN, T. RN AT [phone] ON 10/01/2023 BY MECIAL J.    Staphylococcus aureus POSITIVE (A) NEGATIVE Final    Comment: (NOTE) The Xpert SA Assay (FDA approved for NASAL specimens in patients 53 years of age and older), is one component of a comprehensive surveillance program. It is not intended to diagnose infection nor to guide or monitor treatment. Performed at [facility], [address], [place], [place] [address]     Antimicrobials: Anti-infectives (From admission, onward)    Start     Dose/Rate Route Frequency Ordered Stop   09/27/23 2315  piperacillin-tazobactam (ZOSYN) IVPB 3.375 g  Status:  Discontinued        3.375 g 12.5 mL/hr over 240 Minutes Intravenous Every 8 hours 09/27/23 2305 10/02/23 1106       Culture/Microbiology    Component Value Date/Time   SDES  09/20/2023 1930    BLOOD BLOOD RIGHT HAND Performed at [facility], [address] [place], [place] [address]    SPECREQUEST  09/20/2023 1930    BOTTLES DRAWN AEROBIC  AND ANAEROBIC Blood Culture results may not be optimal due to an inadequate volume of blood received in culture bottles Performed at [facility], [address] [place], [place] [address]    CULT  09/20/2023 1930    NO GROWTH 5 DAYS Performed at [facility], [address], [place], [place] [address]    REPTSTATUS 09/25/2023 FINAL 09/20/2023 1930  Radiology Studies: ECHOCARDIOGRAM COMPLETE  Result Date: 10/01/2023    ECHOCARDIOGRAM REPORT   Patient Name:   NOLANI NORBERTO Date of Exam: 10/01/2023 Medical Rec #:  [id number]      Height:       66.0 in Accession #:    [id number]     Weight:       124.0 lb Date of Birth:  [birth year]     BSA:          1.632 m Patient Age:    81 years       BP:           120/54 mmHg Patient Gender: F              HR:           86 bpm. Exam Location:  Inpatient Procedure: 2D Echo, Cardiac Doppler and Color Doppler Indications:    Chest Pain R07.9  History:        Patient has prior history of Echocardiogram examinations, most                 recent 04/10/2016. Arrythmias:Atrial Fibrillation; Risk                 Factors:Non-Smoker.  Sonographer:    Dondra Prader RVT RCS Referring Phys: [id number] [facility]  Sonographer Comments: Patient refused to reposition; exam done with patient supine. IMPRESSIONS  1. Left ventricular ejection fraction, by estimation, is 70 to 75%. The left ventricle has hyperdynamic function. The left ventricle has no regional wall motion abnormalities. There is mild concentric left ventricular hypertrophy. Left ventricular diastolic parameters are consistent with Grade I diastolic dysfunction (impaired relaxation).  2. Right ventricular systolic function is normal. The right  ventricular size is normal.  3. The mitral valve is normal in structure. Trivial mitral valve regurgitation. No evidence of mitral stenosis.  4. The aortic valve is tricuspid. There is mild calcification of the aortic valve. Aortic valve regurgitation is not visualized. Aortic valve sclerosis/calcification is present, without any evidence of aortic stenosis.  5. The inferior vena cava is normal in size with greater than 50% respiratory variability, suggesting right atrial pressure of 3 mmHg. FINDINGS  Left Ventricle: Left ventricular ejection fraction, by estimation, is 70 to 75%. The left ventricle has hyperdynamic function. The left ventricle has no regional wall motion abnormalities. The left ventricular internal cavity size was normal in size. There is mild concentric left ventricular hypertrophy. Left ventricular diastolic parameters are consistent with Grade I diastolic dysfunction (impaired relaxation). Right Ventricle: The right ventricular size is normal. No increase in right ventricular wall thickness. Right ventricular systolic function is normal. Left Atrium: Left atrial size was normal in size. Right Atrium: Right atrial size was normal in size. Pericardium: There is no evidence of pericardial effusion. Mitral Valve: The mitral valve is normal in structure. Trivial mitral valve regurgitation. No evidence of mitral valve stenosis. Tricuspid Valve: The tricuspid valve is normal in structure. Tricuspid valve regurgitation is trivial. No evidence of tricuspid stenosis. Aortic Valve: The aortic valve is tricuspid. There is mild calcification  of the aortic valve. Aortic valve regurgitation is not visualized. Aortic valve sclerosis/calcification is present, without any evidence of aortic stenosis. Aortic valve mean gradient measures 3.0 mmHg. Aortic valve peak gradient measures 5.6 mmHg. Aortic valve area, by VTI measures 2.71 cm. Pulmonic Valve: The pulmonic valve was normal in structure. Pulmonic valve  regurgitation is not visualized. No evidence of pulmonic stenosis. Aorta: The aortic root is normal in size and structure. Venous: The inferior vena cava is normal in size with greater than 50% respiratory variability, suggesting right atrial pressure of 3 mmHg. IAS/Shunts: No atrial level shunt detected by color flow Doppler.  LEFT VENTRICLE PLAX 2D LVIDd:         3.70 cm   Diastology LVIDs:         2.10 cm   LV e' medial:    4.13 cm/s LV PW:         0.90 cm   LV E/e' medial:  16.0 LV IVS:        1.00 cm   LV e' lateral:   11.00 cm/s LVOT diam:     2.10 cm   LV E/e' lateral: 6.0 LV SV:         54 LV SV Index:   33 LVOT Area:     3.46 cm  RIGHT VENTRICLE RV S prime:     18.30 cm/s LEFT ATRIUM         Index LA diam:    2.70 cm 1.65 cm/m  AORTIC VALVE                    PULMONIC VALVE AV Area (Vmax):    2.68 cm     PV Vmax:       1.17 m/s AV Area (Vmean):   2.49 cm     PV Peak grad:  5.5 mmHg AV Area (VTI):     2.71 cm AV Vmax:           118.00 cm/s AV Vmean:          79.900 cm/s AV VTI:            0.198 m AV Peak Grad:      5.6 mmHg AV Mean Grad:      3.0 mmHg LVOT Vmax:         91.20 cm/s LVOT Vmean:        57.500 cm/s LVOT VTI:          0.155 m LVOT/AV VTI ratio: 0.78  AORTA Ao Root diam: 3.20 cm Ao Asc diam:  2.90 cm MITRAL VALVE MV Area (PHT): 3.72 cm    SHUNTS MV Decel Time: 204 msec    Systemic VTI:  0.16 m MV E velocity: 66.00 cm/s  Systemic Diam: 2.10 cm MV A velocity: 90.80 cm/s MV E/A ratio:  0.73 Arvilla Meres MD Electronically signed by Arvilla Meres MD Signature Date/Time: 10/01/2023/3:06:21 PM    Final      LOS: 6 days   Lanae Boast, MD [facility]  10/03/2023, 11:31 AM

## 2023-10-03 NOTE — Progress Notes (Signed)
2140: pt noted to be holding NG tube on hand as this nurse enter the room. Pt stated " I do not remember pulling it out." Pt alert and oriented *3-4. NG canister had 10 cc brown output. Bowel sounds active upon ausculation. No c/o nausea/vomiting. On call Dr. Lenon Oms notified. No order to re-insert NG tube and to keep it off for now. Pt resting with HOB at 30 degree. Dressing to abdomal incision changed as ordered. Call bell in reach. Will continue to monitor.

## 2023-10-04 ENCOUNTER — Inpatient Hospital Stay (HOSPITAL_COMMUNITY): Payer: PPO

## 2023-10-04 ENCOUNTER — Encounter (HOSPITAL_COMMUNITY): Payer: Self-pay | Admitting: Family Medicine

## 2023-10-04 DIAGNOSIS — R58 Hemorrhage, not elsewhere classified: Secondary | ICD-10-CM

## 2023-10-04 DIAGNOSIS — K529 Noninfective gastroenteritis and colitis, unspecified: Secondary | ICD-10-CM | POA: Diagnosis not present

## 2023-10-04 DIAGNOSIS — I2699 Other pulmonary embolism without acute cor pulmonale: Secondary | ICD-10-CM

## 2023-10-04 DIAGNOSIS — Z86711 Personal history of pulmonary embolism: Secondary | ICD-10-CM

## 2023-10-04 DIAGNOSIS — D62 Acute posthemorrhagic anemia: Secondary | ICD-10-CM | POA: Diagnosis not present

## 2023-10-04 LAB — CBC
HCT: 21.2 % — ABNORMAL LOW (ref 36.0–46.0)
Hemoglobin: 6.8 g/dL — CL (ref 12.0–15.0)
MCH: 30.9 pg (ref 26.0–34.0)
MCHC: 32.1 g/dL (ref 30.0–36.0)
MCV: 96.4 fL (ref 80.0–100.0)
Platelets: 263 10*3/uL (ref 150–400)
RBC: 2.2 MIL/uL — ABNORMAL LOW (ref 3.87–5.11)
RDW: 13.6 % (ref 11.5–15.5)
WBC: 19.4 10*3/uL — ABNORMAL HIGH (ref 4.0–10.5)
nRBC: 0 % (ref 0.0–0.2)

## 2023-10-04 LAB — HEMOGLOBIN AND HEMATOCRIT, BLOOD
HCT: 23.2 % — ABNORMAL LOW (ref 36.0–46.0)
HCT: 23.4 % — ABNORMAL LOW (ref 36.0–46.0)
HCT: 21.4 % — ABNORMAL LOW (ref 36.0–46.0)
Hemoglobin: 7.4 g/dL — ABNORMAL LOW (ref 12.0–15.0)
Hemoglobin: 7.5 g/dL — ABNORMAL LOW (ref 12.0–15.0)
Hemoglobin: 7 g/dL — ABNORMAL LOW (ref 12.0–15.0)

## 2023-10-04 LAB — FERRITIN: Ferritin: 166 ng/mL (ref 11–307)

## 2023-10-04 LAB — IRON AND TIBC
Iron: 22 ug/dL — ABNORMAL LOW (ref 28–170)
Saturation Ratios: 10 % — ABNORMAL LOW (ref 10.4–31.8)
TIBC: 228 ug/dL — ABNORMAL LOW (ref 250–450)
UIBC: 206 ug/dL

## 2023-10-04 LAB — URINALYSIS, ROUTINE W REFLEX MICROSCOPIC
Glucose, UA: NEGATIVE mg/dL
Ketones, ur: NEGATIVE mg/dL
Nitrite: POSITIVE — AB
Protein, ur: >300 mg/dL — AB
Specific Gravity, Urine: >1.03 — ABNORMAL HIGH (ref 1.005–1.030)
pH: 6.5 (ref 5.0–8.0)

## 2023-10-04 LAB — RETICULOCYTES
Immature Retic Fract: 23.6 % — ABNORMAL HIGH (ref 2.3–15.9)
RBC.: 2.47 MIL/uL — ABNORMAL LOW (ref 3.87–5.11)
Retic Count, Absolute: 107.9 10*3/uL (ref 19.0–186.0)
Retic Ct Pct: 4.4 % — ABNORMAL HIGH (ref 0.4–3.1)

## 2023-10-04 LAB — GLUCOSE, CAPILLARY
Glucose-Capillary: 116 mg/dL — ABNORMAL HIGH (ref 70–99)
Glucose-Capillary: 120 mg/dL — ABNORMAL HIGH (ref 70–99)
Glucose-Capillary: 121 mg/dL — ABNORMAL HIGH (ref 70–99)
Glucose-Capillary: 121 mg/dL — ABNORMAL HIGH (ref 70–99)
Glucose-Capillary: 123 mg/dL — ABNORMAL HIGH (ref 70–99)
Glucose-Capillary: 130 mg/dL — ABNORMAL HIGH (ref 70–99)
Glucose-Capillary: 155 mg/dL — ABNORMAL HIGH (ref 70–99)

## 2023-10-04 LAB — DIRECT ANTIGLOBULIN TEST (NOT AT ARMC)
DAT, IgG: NEGATIVE
DAT, complement: NEGATIVE

## 2023-10-04 LAB — BASIC METABOLIC PANEL
Anion gap: 7 (ref 5–15)
BUN: 30 mg/dL — ABNORMAL HIGH (ref 8–23)
CO2: 26 mmol/L (ref 22–32)
Calcium: 8.5 mg/dL — ABNORMAL LOW (ref 8.9–10.3)
Chloride: 107 mmol/L (ref 98–111)
Creatinine, Ser: 0.58 mg/dL (ref 0.44–1.00)
GFR, Estimated: >60 mL/min (ref >60)
Glucose, Bld: 133 mg/dL — ABNORMAL HIGH (ref 70–99)
Potassium: 4 mmol/L (ref 3.5–5.1)
Sodium: 140 mmol/L (ref 135–145)

## 2023-10-04 LAB — URINALYSIS, MICROSCOPIC (REFLEX): RBC / HPF: >50 RBC/hpf (ref 0–5)

## 2023-10-04 LAB — PREPARE RBC (CROSSMATCH)

## 2023-10-04 LAB — VITAMIN B12: Vitamin B-12: 3343 pg/mL — ABNORMAL HIGH (ref 180–914)

## 2023-10-04 LAB — LACTATE DEHYDROGENASE: LDH: 128 U/L (ref 98–192)

## 2023-10-04 LAB — HEPARIN LEVEL (UNFRACTIONATED): Heparin Unfractionated: 0.56 [IU]/mL (ref 0.30–0.70)

## 2023-10-04 LAB — FOLATE: Folate: 11.2 ng/mL (ref >5.9)

## 2023-10-04 MED ORDER — PROCHLORPERAZINE EDISYLATE 10 MG/2ML IJ SOLN
10.0000 mg | Freq: Four times a day (QID) | INTRAMUSCULAR | Status: DC | PRN
  Administered 2023-10-04 – 2023-10-05 (×2): 10 mg via INTRAVENOUS
  Filled 2023-10-04 (×4): qty 2

## 2023-10-04 MED ORDER — SODIUM CHLORIDE 0.9% IV SOLUTION: Freq: Once | INTRAVENOUS | Status: AC

## 2023-10-04 MED ORDER — PROTAMINE SULFATE 10 MG/ML IV SOLN
50.0000 mg | Freq: Once | INTRAVENOUS | Status: AC
  Administered 2023-10-04: 50 mg via INTRAVENOUS
  Filled 2023-10-04: qty 5

## 2023-10-04 MED ORDER — IOHEXOL 300 MG/ML  SOLN
100.0000 mL | Freq: Once | INTRAMUSCULAR | Status: AC | PRN
  Administered 2023-10-04: 100 mL via INTRAVENOUS

## 2023-10-04 MED ORDER — SODIUM CHLORIDE 0.9 % IV SOLN
510.0000 mg | Freq: Once | INTRAVENOUS | Status: AC
  Administered 2023-10-04: 510 mg via INTRAVENOUS
  Filled 2023-10-04: qty 17

## 2023-10-04 MED ORDER — DEXTROSE-SODIUM CHLORIDE 5-0.9 % IV SOLN: INTRAVENOUS | Status: DC

## 2023-10-04 MED ORDER — PIPERACILLIN-TAZOBACTAM 3.375 G IVPB
3.3750 g | Freq: Three times a day (TID) | INTRAVENOUS | Status: DC
  Administered 2023-10-04 – 2023-10-05 (×2): 3.375 g via INTRAVENOUS
  Filled 2023-10-04 (×2): qty 50

## 2023-10-04 NOTE — TOC Progression Note (Signed)
Transition of Care [facility]) - Progression Note   Patient Details  Name: Elizabeth Odom MRN: [id number] Date of Birth: 1941-04-14  Transition of Care [facility]) CM/SW Contact  Ewing Schlein, LCSW Phone Number: 10/04/2023, 1:37 PM  Clinical Narrative: PT evaluation now recommending SNF. CSW spoke with patient to discuss the change in recommendations. Patient reported she does not want to go to SNF and is declining to be faxed out at this time.  Expected Discharge Plan: Home w Home Health Services Barriers to Discharge: Continued Medical Work up  Expected Discharge Plan and Services In-house Referral: Clinical Social Work Post Acute Care Choice: Home Health Living arrangements for the past 2 months: Independent Living Facility, Apartment          DME Arranged: N/A DME Agency: NA HH Arranged: PT, RN HH Agency: [facility] Date HH Agency Contacted: 10/02/23 Time HH Agency Contacted: 1550 Representative spoke with at [facility] Agency: Morrie Sheldon  Social Determinants of Health (SDOH) Interventions SDOH Screenings   Food Insecurity: No Food Insecurity (09/28/2023)  Housing: Low Risk  (09/28/2023)  Transportation Needs: No Transportation Needs (09/28/2023)  Utilities: Not At Risk (09/28/2023)  Tobacco Use: Low Risk  (10/01/2023)   Readmission Risk Interventions     No data to display

## 2023-10-04 NOTE — Progress Notes (Signed)
Date and time results received: 10/04/23 at 0434  Test: Hemoglobin Critical Value: 6.8  Name of Provider Notified: Notified primary nurse (Laxmi, RN) who is in the process of notifying the on-call provider  Orders Received? Or Actions Taken?: awaiting any new  orders

## 2023-10-04 NOTE — Progress Notes (Signed)
PHARMACY - ANTICOAGULATION CONSULT NOTE  Pharmacy Consult for heparin  Indication: acute pulmonary embolus  Allergies  Allergen Reactions   Elemental Sulfur Anaphylaxis    Patient Measurements: Height: 5\' 6"  (167.6 cm) Weight: 64.5 kg (142 lb 3.2 oz) IBW/kg (Calculated) : 59.3 Heparin Dosing Weight: 61 kg  Vital Signs: Temp: 97.8 F (36.6 C) (10/24 0628) Temp Source: Oral (10/24 0628) BP: 122/49 (10/24 0628) Pulse Rate: 79 (10/24 0628)  Labs: Recent Labs    10/02/23 0408 10/02/23 1238 10/02/23 1944 10/03/23 0345 10/03/23 1114 10/03/23 2124 10/04/23 0359  HGB 10.2*  --   --  8.6*  --  7.0* 6.8*  HCT 31.4*  --   --  25.8*  --  21.4* 21.2*  PLT 314  --   --  325  --   --  263  HEPARINUNFRC 0.26*   < >  --  0.66 0.59  --  0.56  CREATININE 0.92  --  0.68 0.59  --   --  0.58   < > = values in this interval not displayed.    Estimated Creatinine Clearance: 50.8 mL/min (by C-G formula based on SCr of 0.58 mg/dL).   Medical History: Past Medical History:  Diagnosis Date   A-fib (HCC) 04/09/2016   Acute head injury 04/09/2016   Cervical cancer (HCC)    Cervical spine fracture, initial encounter 04/09/2016   Essential hypertension    History of anemia    Multiple fractures of cervical spine (HCC) 04/09/2016   PAC (premature atrial contraction)    Restless leg    Syncope and collapse 04/09/2016    Assessment: Patient is an 82 y.o female with a PMH significant for Afib not on anticoagulation PTA and anemia presented to the ED on 09/27/23 with abdominal pain, CT on 09/27/23 concerning for acute colitis. She underwent sigmoidoscopy on 09/29/23, but GI team was not able to proceed/advance to intended area. Patient was then taken to the OR on 10/01/23 for exp lap, sigmoid colectomy and small bowel resection. Updated chest CT from 09/30/23 came back on 10/01/23 with findings consistent with "acute small-to-moderate volume pulmonary embolism."  Heparin drip started at 6p on  10/01/23 s/p OR (no bolus per MD request).   10/04/2023: - heparin level therapeutic at 0.56  - hgb trending down to 6.8, pt received 1 unit RBC transfusion this morning  - plts trending down at 263  - Pt's RN documented brown bloody urine but no other active bleeding has been reported. MD recommends continue heparin for now with close monitoring   Goal of Therapy:  Heparin level 0.3-0.7 units/ml Monitor platelets by anticoagulation protocol: Yes   Plan:  - continue heparin drip at 1100 units/hr - daily heparin level  - monitor CBC closely  - monitor for severity of bleeding   Karlton Lemon, PharmD Candidate  10/04/2023,7:58 AM

## 2023-10-04 NOTE — Progress Notes (Signed)
eLink Physician-Brief Progress Note Patient Name: Elizabeth Odom DOB: Oct 28, 1941 MRN: [id number]   Date of Service  10/04/2023  HPI/Events of Note  Patient with a complicated medical history post-op colon resection transferred to the IC following the development of an intra-abdominal hematoma in the context of anticoagulation for PE.  eICU Interventions  New Patient Evaluation.        Thomasene Lot Amia Rynders 10/04/2023, 11:10 PM

## 2023-10-04 NOTE — Progress Notes (Signed)
Elizabeth Odom  [phone]   HEMATOLOGY ONCOLOGY INPATIENT CONSULTATION   Elizabeth Odom  DOB: [birth year]  MR#: [id number]  CSN#: [id number]    Requesting Physician: [facility]  Patient Care Team: [name], FNP as PCP - General (Family Medicine)  Reason for consult: anemia   History of present illness:   82 year old female with past medical history of atrial fibrillation, was admitted to hospital on 10/17 for acute colitis, status post exploratory laparoscope and sigmoid colon and small bowel resection on 10/21.  CT scan obtained before surgery found multiple segmental PEs in bilateral lungs, no evidence of right heart strain.  Patient was started on IV heparin after surgery.  Her hemoglobin was normal on admission around 12-13, her blood loss was around 450 cc during the surgery.  Her hemoglobin dropped to 10.21-day after surgery, and has been trending down to 6.8 this morning.  Patient noticed dark urine, no frank blood in her colostomy bag, or other sign of bleeding.  I was called to evaluate her anemia.  She denies a history of anemia in the past, but has been taking oral iron at home before she came to hospital.  MEDICAL HISTORY:  Past Medical History:  Diagnosis Date   A-fib (HCC) 04/09/2016   Acute head injury 04/09/2016   Cervical cancer (HCC)    Cervical spine fracture, initial encounter 04/09/2016   Essential hypertension    History of anemia    Multiple fractures of cervical spine (HCC) 04/09/2016   PAC (premature atrial contraction)    Restless leg    Syncope and collapse 04/09/2016    SURGICAL HISTORY: Past Surgical History:  Procedure Laterality Date   APPENDECTOMY     BIOPSY  09/29/2023   Procedure: BIOPSY;  Surgeon: Kerin Salen, MD;  Location: [facility];  Service: Gastroenterology;;   breast lump removal Left    CERVICAL LAMINECTOMY     COLECTOMY WITH COLOSTOMY CREATION/HARTMANN PROCEDURE N/A 10/01/2023   Procedure:  COLECTOMY WITH COLOSTOMY CREATION/HARTMANN PROCEDURE;  Surgeon: Axel Filler, MD;  Location: [facility];  Service: General;  Laterality: N/A;   complete hysterectomy     EP IMPLANTABLE DEVICE N/A 04/10/2016   Procedure: Loop Recorder Insertion;  Surgeon: Marinus Maw, MD;  Location: [facility];  Service: Cardiovascular;  Laterality: N/A;   FLEXIBLE SIGMOIDOSCOPY N/A 09/29/2023   Procedure: FLEXIBLE SIGMOIDOSCOPY;  Surgeon: Kerin Salen, MD;  Location: [facility];  Service: Gastroenterology;  Laterality: N/A;   HARVEST BONE GRAFT     LOOP RECORDER REMOVAL N/A 07/08/2018   Procedure: LOOP RECORDER REMOVAL;  Surgeon: Marinus Maw, MD;  Location: [facility];  Service: Cardiovascular;  Laterality: N/A;    SOCIAL HISTORY: Social History   Socioeconomic History   Marital status: Widowed    Spouse name: Not on file   Number of children: 3   Years of education: schooling in [place]   Highest education level: Not on file  Occupational History   Not on file  Tobacco Use   Smoking status: Never   Smokeless tobacco: Never  Substance and Sexual Activity   Alcohol use: Never    Alcohol/week: 0.0 standard drinks of alcohol   Drug use: Never   Sexual activity: Not on file  Other Topics Concern   Not on file  Social History Narrative   Lives at home with husband    Caffeine: never   Social Determinants of Health   Financial Resource Strain: Not on file  Food Insecurity: No Food Insecurity (09/28/2023)   Hunger Vital Sign    Worried About Running Out of Food in the Last Year: Never true    Ran Out of Food in the Last Year: Never true  Transportation Needs: No Transportation Needs (09/28/2023)   PRAPARE - Administrator, Civil Service (Medical): No    Lack of Transportation (Non-Medical): No  Physical Activity: Not on file  Stress: Not on file  Social Connections: Not on file  Intimate Partner Violence: Not At Risk (09/28/2023)   Humiliation, Afraid, Rape,  and Kick questionnaire    Fear of Current or Ex-Partner: No    Emotionally Abused: No    Physically Abused: No    Sexually Abused: No    FAMILY HISTORY: Family History  Problem Relation Age of Onset   Other Mother        thrombosis   Cancer Sister    Diabetes Sister    Stroke Daughter    Thyroid disease Daughter     ALLERGIES:  is allergic to elemental sulfur.  MEDICATIONS:  Current Facility-Administered Medications  Medication Dose Route Frequency Provider Last Rate Last Admin   acetaminophen (TYLENOL) tablet 650 mg  650 mg Oral Q6H PRN Kerin Salen, MD       Or   acetaminophen (TYLENOL) suppository 650 mg  650 mg Rectal Q6H PRN Kerin Salen, MD       acidophilus (RISAQUAD) capsule 2 capsule  2 capsule Oral TID Kerin Salen, MD   2 capsule at 09/30/23 2230   bisacodyl (DULCOLAX) suppository 10 mg  10 mg Rectal Once Kerin Salen, MD       Chlorhexidine Gluconate Cloth 2 % PADS 6 each  6 each Topical Q0600 Kc, Ramesh, MD   6 each at 10/03/23 0608   dextrose 5 %-0.9 % sodium chloride infusion   Intravenous Continuous Kc, Ramesh, MD 40 mL/hr at 10/04/23 0941 New Bag at 10/04/23 0941   heparin ADULT infusion 100 units/mL (25000 units/2107mL)  1,100 Units/hr Intravenous Continuous Phylliss Blakes, RPH 11 mL/hr at 10/04/23 1517 1,100 Units/hr at 10/04/23 1517   HYDROmorphone (DILAUDID) injection 0.5-2 mg  0.5-2 mg Intravenous Q2H PRN Axel Filler, MD   1 mg at 10/04/23 1246   insulin aspart (novoLOG) injection 0-9 Units  0-9 Units Subcutaneous Q4H Kerin Salen, MD   1 Units at 10/04/23 1602   ketorolac (TORADOL) 15 MG/ML injection 15 mg  15 mg Intravenous Q6H PRN Axel Filler, MD   15 mg at 10/02/23 0160   LORazepam (ATIVAN) tablet 0.5 mg  0.5 mg Oral Once PRN Kerin Salen, MD       mupirocin ointment (BACTROBAN) 2 % 1 Application  1 Application Nasal BID Kc, Dayna Barker, MD   1 Application at 10/04/23 1037   ondansetron (ZOFRAN) tablet 4 mg  4 mg Oral Q6H PRN Kerin Salen, MD       Or    ondansetron [facility]) injection 4 mg  4 mg Intravenous Q6H PRN Kerin Salen, MD   4 mg at 10/04/23 0715   phenol (CHLORASEPTIC) mouth spray 1 spray  1 spray Mouth/Throat PRN Kc, Dayna Barker, MD   1 spray at 10/01/23 1504   prochlorperazine (COMPAZINE) injection 10 mg  10 mg Intravenous Q6H PRN Barnetta Chapel, PA-C   10 mg at 10/04/23 1513    REVIEW OF SYSTEMS:   Constitutional: Denies fevers, chills or abnormal night sweats Eyes: Denies blurriness of vision, double vision or watery eyes Ears, nose, mouth,  throat, and face: Denies mucositis or sore throat Respiratory: Denies cough, dyspnea or wheezes Cardiovascular: Denies palpitation, chest discomfort or lower extremity swelling Gastrointestinal:  Denies nausea, heartburn or change in bowel habits Skin: Denies abnormal skin rashes Lymphatics: Denies new lymphadenopathy or easy bruising Neurological:Denies numbness, tingling or new weaknesses Behavioral/Psych: Mood is stable, no new changes  All other systems were reviewed with the patient and are negative.  PHYSICAL EXAMINATION: ECOG PERFORMANCE STATUS: 3 - Symptomatic, >50% confined to bed  Vitals:   10/04/23 0628 10/04/23 0901  BP: (!) 122/49 (!) 132/55  Pulse: 79 87  Resp: 18 16  Temp: 97.8 F (36.6 C) 98.3 F (36.8 C)  SpO2: 98% 100%   Filed Weights   10/02/23 0500 10/03/23 0500 10/04/23 0500  Weight: 134 lb 11.2 oz (61.1 kg) 142 lb 3.2 oz (64.5 kg) 142 lb 3.2 oz (64.5 kg)    GENERAL:alert, no distress and comfortable SKIN: skin color, texture, turgor are normal, no rashes or significant lesions EYES: normal, conjunctiva are pink and non-injected, sclera clear OROPHARYNX:no exudate, no erythema and lips, buccal mucosa, and tongue normal  NECK: supple, thyroid normal size, non-tender, without nodularity LYMPH:  no palpable lymphadenopathy in the cervical, axillary or inguinal LUNGS: clear to auscultation and percussion with normal breathing effort HEART: regular rate &  rhythm and no murmurs and no lower extremity edema ABDOMEN:abdomen soft, non-tender and normal bowel sounds Musculoskeletal:no cyanosis of digits and no clubbing  PSYCH: alert & oriented x 3 with fluent speech NEURO: no focal motor/sensory deficits  LABORATORY DATA:  I have reviewed the data as listed Lab Results  Component Value Date   WBC 19.4 (H) 10/04/2023   HGB 7.4 (L) 10/04/2023   HCT 23.2 (L) 10/04/2023   MCV 96.4 10/04/2023   PLT 263 10/04/2023   Recent Labs    09/20/23 1838 09/27/23 2007 09/28/23 0025 10/02/23 1944 10/03/23 0345 10/04/23 0359  NA 131* 134*   < > 135 137 140  K 3.8 3.6   < > 4.0 4.3 4.0  CL 95* 99   < > 103 105 107  CO2 25 22   < > 26 24 26   GLUCOSE 157* 184*   < > 146* 176* 133*  BUN 12 16   < > 34* 30* 30*  CREATININE 0.87 0.90   < > 0.68 0.59 0.58  CALCIUM 9.0 9.5   < > 7.8* 8.1* 8.5*  GFRNONAA >60 >60   < > >60 >60 >60  PROT 8.0 7.8  --   --   --   --   ALBUMIN 3.3* 3.7  --   --   --   --   AST 31 23  --   --   --   --   ALT 48* 28  --   --   --   --   ALKPHOS 105 91  --   --   --   --   BILITOT 1.2 0.6  --   --   --   --    < > = values in this interval not displayed.    RADIOGRAPHIC STUDIES: I have personally reviewed the radiological images as listed and agreed with the findings in the report. VAS [place] LOWER EXTREMITY VENOUS (DVT)  Result Date: 10/04/2023  Lower Venous DVT Study Patient Name:  Elizabeth Odom  Date of Exam:   10/04/2023 Medical Rec #: [id number]       Accession #:    [id number]  Date of Birth: 1941-11-07      Patient Gender: F Patient Age:   43 years Exam Location:  [facility] Procedure:      VAS [place] LOWER EXTREMITY VENOUS (DVT) Referring Phys: RAMESH KC --------------------------------------------------------------------------------  Indications: Pulmonary embolism.  Risk Factors: Confirmed PE. Anticoagulation: Heparin. Comparison Study: No prior studies. Performing Technologist: Chanda Busing RVT  Examination  Guidelines: A complete evaluation includes B-mode imaging, spectral Doppler, color Doppler, and power Doppler as needed of all accessible portions of each vessel. Bilateral testing is considered an integral part of a complete examination. Limited examinations for reoccurring indications may be performed as noted. The reflux portion of the exam is performed with the patient in reverse Trendelenburg.  +---------+---------------+---------+-----------+----------+--------------+ RIGHT    CompressibilityPhasicitySpontaneityPropertiesThrombus Aging +---------+---------------+---------+-----------+----------+--------------+ CFV      Full           Yes      Yes                                 +---------+---------------+---------+-----------+----------+--------------+ SFJ      Full                                                        +---------+---------------+---------+-----------+----------+--------------+ FV Prox  Full                                                        +---------+---------------+---------+-----------+----------+--------------+ FV Mid   Full                                                        +---------+---------------+---------+-----------+----------+--------------+ FV DistalFull                                                        +---------+---------------+---------+-----------+----------+--------------+ PFV      Full                                                        +---------+---------------+---------+-----------+----------+--------------+ POP      Full           Yes      Yes                                 +---------+---------------+---------+-----------+----------+--------------+ PTV      Full                                                        +---------+---------------+---------+-----------+----------+--------------+  PERO     Full                                                         +---------+---------------+---------+-----------+----------+--------------+   +---------+---------------+---------+-----------+----------+--------------+ LEFT     CompressibilityPhasicitySpontaneityPropertiesThrombus Aging +---------+---------------+---------+-----------+----------+--------------+ CFV      Full           Yes      Yes                                 +---------+---------------+---------+-----------+----------+--------------+ SFJ      Full                                                        +---------+---------------+---------+-----------+----------+--------------+ FV Prox  Full                                                        +---------+---------------+---------+-----------+----------+--------------+ FV Mid   Full                                                        +---------+---------------+---------+-----------+----------+--------------+ FV DistalFull                                                        +---------+---------------+---------+-----------+----------+--------------+ PFV      Full                                                        +---------+---------------+---------+-----------+----------+--------------+ POP      Partial        Yes      Yes                  Acute          +---------+---------------+---------+-----------+----------+--------------+ PTV      Partial                                      Acute          +---------+---------------+---------+-----------+----------+--------------+ PERO     Partial                                      Acute          +---------+---------------+---------+-----------+----------+--------------+ Gastroc  None  Acute          +---------+---------------+---------+-----------+----------+--------------+    Summary: RIGHT: - There is no evidence of deep vein thrombosis in the lower extremity.  - No cystic structure found in  the popliteal fossa.  LEFT: - Findings consistent with acute deep vein thrombosis involving the left popliteal vein, left posterior tibial veins, and left peroneal veins. Findings consistent with acute intramuscular thrombosis involving the left gastrocnemius veins. - No cystic structure found in the popliteal fossa.  *See table(s) above for measurements and observations.    Preliminary    DG Abd Portable 1V  Result Date: 10/04/2023 CLINICAL DATA:  Nausea and vomiting. EXAM: PORTABLE ABDOMEN - 1 VIEW COMPARISON:  09/27/2019 for FINDINGS: The bowel gas pattern has improved from the previous exam. There is been interval decrease in previously noted colonic distension. Within the left lower quadrant of the abdomen there are several dilated loops of small bowel which measure up to 3 mm, similar to the previous CT. IMPRESSION: 1. Interval improvement in diffuse colonic distension. 2. Persistent mild dilatation of small bowel loops in the left lower quadrant of the abdomen. Electronically Signed   By: Signa Kell M.D.   On: 10/04/2023 11:00   ECHOCARDIOGRAM COMPLETE  Result Date: 10/01/2023    ECHOCARDIOGRAM REPORT   Patient Name:   Elizabeth Odom Date of Exam: 10/01/2023 Medical Rec #:  [id number]      Height:       66.0 in Accession #:    [id number]     Weight:       124.0 lb Date of Birth:  1941-02-10     BSA:          1.632 m Patient Age:    81 years       BP:           120/54 mmHg Patient Gender: F              HR:           86 bpm. Exam Location:  Inpatient Procedure: 2D Echo, Cardiac Doppler and Color Doppler Indications:    Chest Pain R07.9  History:        Patient has prior history of Echocardiogram examinations, most                 recent 04/10/2016. Arrythmias:Atrial Fibrillation; Risk                 Factors:Non-Smoker.  Sonographer:    Dondra Prader RVT RCS Referring Phys: [id number] [facility]  Sonographer Comments: Patient refused to reposition; exam done with patient supine. IMPRESSIONS  1. Left  ventricular ejection fraction, by estimation, is 70 to 75%. The left ventricle has hyperdynamic function. The left ventricle has no regional wall motion abnormalities. There is mild concentric left ventricular hypertrophy. Left ventricular diastolic parameters are consistent with Grade I diastolic dysfunction (impaired relaxation).  2. Right ventricular systolic function is normal. The right ventricular size is normal.  3. The mitral valve is normal in structure. Trivial mitral valve regurgitation. No evidence of mitral stenosis.  4. The aortic valve is tricuspid. There is mild calcification of the aortic valve. Aortic valve regurgitation is not visualized. Aortic valve sclerosis/calcification is present, without any evidence of aortic stenosis.  5. The inferior vena cava is normal in size with greater than 50% respiratory variability, suggesting right atrial pressure of 3 mmHg. FINDINGS  Left Ventricle: Left ventricular ejection fraction, by estimation, is 70 to 75%. The left  ventricle has hyperdynamic function. The left ventricle has no regional wall motion abnormalities. The left ventricular internal cavity size was normal in size. There is mild concentric left ventricular hypertrophy. Left ventricular diastolic parameters are consistent with Grade I diastolic dysfunction (impaired relaxation). Right Ventricle: The right ventricular size is normal. No increase in right ventricular wall thickness. Right ventricular systolic function is normal. Left Atrium: Left atrial size was normal in size. Right Atrium: Right atrial size was normal in size. Pericardium: There is no evidence of pericardial effusion. Mitral Valve: The mitral valve is normal in structure. Trivial mitral valve regurgitation. No evidence of mitral valve stenosis. Tricuspid Valve: The tricuspid valve is normal in structure. Tricuspid valve regurgitation is trivial. No evidence of tricuspid stenosis. Aortic Valve: The aortic valve is tricuspid. There  is mild calcification of the aortic valve. Aortic valve regurgitation is not visualized. Aortic valve sclerosis/calcification is present, without any evidence of aortic stenosis. Aortic valve mean gradient measures 3.0 mmHg. Aortic valve peak gradient measures 5.6 mmHg. Aortic valve area, by VTI measures 2.71 cm. Pulmonic Valve: The pulmonic valve was normal in structure. Pulmonic valve regurgitation is not visualized. No evidence of pulmonic stenosis. Aorta: The aortic root is normal in size and structure. Venous: The inferior vena cava is normal in size with greater than 50% respiratory variability, suggesting right atrial pressure of 3 mmHg. IAS/Shunts: No atrial level shunt detected by color flow Doppler.  LEFT VENTRICLE PLAX 2D LVIDd:         3.70 cm   Diastology LVIDs:         2.10 cm   LV e' medial:    4.13 cm/s LV PW:         0.90 cm   LV E/e' medial:  16.0 LV IVS:        1.00 cm   LV e' lateral:   11.00 cm/s LVOT diam:     2.10 cm   LV E/e' lateral: 6.0 LV SV:         54 LV SV Index:   33 LVOT Area:     3.46 cm  RIGHT VENTRICLE RV S prime:     18.30 cm/s LEFT ATRIUM         Index LA diam:    2.70 cm 1.65 cm/m  AORTIC VALVE                    PULMONIC VALVE AV Area (Vmax):    2.68 cm     PV Vmax:       1.17 m/s AV Area (Vmean):   2.49 cm     PV Peak grad:  5.5 mmHg AV Area (VTI):     2.71 cm AV Vmax:           118.00 cm/s AV Vmean:          79.900 cm/s AV VTI:            0.198 m AV Peak Grad:      5.6 mmHg AV Mean Grad:      3.0 mmHg LVOT Vmax:         91.20 cm/s LVOT Vmean:        57.500 cm/s LVOT VTI:          0.155 m LVOT/AV VTI ratio: 0.78  AORTA Ao Root diam: 3.20 cm Ao Asc diam:  2.90 cm MITRAL VALVE MV Area (PHT): 3.72 cm    SHUNTS MV Decel Time: 204 msec  Systemic VTI:  0.16 m MV E velocity: 66.00 cm/s  Systemic Diam: 2.10 cm MV A velocity: 90.80 cm/s MV E/A ratio:  0.73 Arvilla Meres MD Electronically signed by Arvilla Meres MD Signature Date/Time: 10/01/2023/3:06:21 PM    Final     CT CHEST W CONTRAST  Addendum Date: 10/01/2023   ADDENDUM REPORT: 10/01/2023 13:00 ADDENDUM: Upon re-review of CT scan chest with contrast performed yesterday (09/30/2023), the following findings are noted: There are several areas of nonocclusive filling defects in the segmental pulmonary artery branches to right upper lobe, right lower lobe and left upper lobe (marked with electronic arrow sign on series 2), compatible with acute small-to-moderate volume pulmonary embolism. No evidence of right heart strain or lung infarction. " Critical Value/emergent results were called by telephone at the time of interpretation on 10/01/2023 at 12:58 pm to provider Dr. Jonathon Bellows, Dayna Barker , who verbally acknowledged these results. Electronically Signed   By: Jules Schick M.D.   On: 10/01/2023 13:00   Result Date: 10/01/2023 CLINICAL DATA:  Colon cancer, staging. * Tracking Code: BO * EXAM: CT CHEST WITH CONTRAST TECHNIQUE: Multidetector CT imaging of the chest was performed during intravenous contrast administration. RADIATION DOSE REDUCTION: This exam was performed according to the departmental dose-optimization program which includes automated exposure control, adjustment of the mA and/or kV according to patient size and/or use of iterative reconstruction technique. CONTRAST:  75mL OMNIPAQUE IOHEXOL 300 MG/ML  SOLN COMPARISON:  None Available. FINDINGS: Cardiovascular:  No pericardial effusion. No aortic aneurysm. Mediastinum/Nodes: There is a 1.4 x 1.9 cm hypoattenuating nodule in the inferior aspect of the isthmus, not well characterized on the current exam. The nodule meets the size criteria for follow-up nonemergent evaluation with thyroid ultrasound. No solid / cystic mediastinal masses. The esophagus is nondistended precluding optimal assessment. No axillary, mediastinal or hilar lymphadenopathy by size criteria. Lungs/Pleura: The central tracheo-bronchial tree is patent. There are patchy areas of linear, plate-like  atelectasis and/or scarring throughout bilateral lungs. No mass or consolidation. No pleural effusion or pneumothorax. No suspicious lung nodules. Upper Abdomen: Visualized upper abdominal viscera within normal limits. Musculoskeletal: The visualized soft tissues of the chest wall are grossly unremarkable. No suspicious osseous lesions. There are mild multilevel degenerative changes in the visualized spine. IMPRESSION: 1. No evidence of metastatic disease in the chest. 2. 1.4 x 1.9 cm hypoattenuating nodule in the inferior aspect of the isthmus, not well characterized on the current exam. The nodule meets the size criteria for follow-up nonemergent evaluation with thyroid ultrasound. Electronically Signed: By: Jules Schick M.D. On: 09/30/2023 15:22   MR ABDOMEN W WO CONTRAST  Result Date: 09/28/2023 CLINICAL DATA:  9 mm pancreatic cystic lesion on CT. EXAM: MRI ABDOMEN WITHOUT AND WITH CONTRAST TECHNIQUE: Multiplanar multisequence MR imaging of the abdomen was performed both before and after the administration of intravenous contrast. CONTRAST:  5mL GADAVIST GADOBUTROL 1 MMOL/ML IV SOLN COMPARISON:  CT of 1 day prior. FINDINGS: Portions of exam are mild to moderately motion degraded. Lower chest: Mild cardiomegaly, without pericardial or pleural effusion. Hepatobiliary: Normal liver. Normal gallbladder, without biliary ductal dilatation. Pancreas: No main pancreatic duct dilatation. Corresponding the CT abnormality, within the pancreatic body/tail junction, is a cystic lesion which measures up to 9 mm on 19/24. No suspicious postcontrast characteristics including on 42/19. No peripancreatic edema. Spleen:  Normal in size, without focal abnormality. Adrenals/Urinary Tract: Normal adrenal glands. Dominant lower pole right renal cyst at 6.3 cm does not warrant imaging follow-up. Normal left kidney. No  hydronephrosis. Stomach/Bowel: Normal stomach and small bowel. Colonic obstruction as detailed on CT.  Vascular/Lymphatic: Aortic atherosclerosis. No retroperitoneal or retrocrural adenopathy. Other:  No ascites. Musculoskeletal: No acute osseous abnormality. IMPRESSION: 1. Mild to moderately motion degraded exam. 2. 9 mm cystic lesion within the pancreatic body/tail junction with differential considerations of pseudocyst or indolent cystic neoplasm. Per consensus criteria, this warrants follow-up with pre and post contrast abdominal MRI at 1 year. This recommendation follows ACR consensus guidelines: Management of Incidental Pancreatic Cysts: A White Paper of the ACR Incidental Findings Committee. J Am Coll Radiol 2017;14:911-923. 3.  Aortic Atherosclerosis (ICD10-I70.0). 4. Colonic obstruction, as on CT. Electronically Signed   By: Jeronimo Greaves M.D.   On: 09/28/2023 08:52   MR 3D Recon At Scanner  Result Date: 09/28/2023 CLINICAL DATA:  9 mm pancreatic cystic lesion on CT. EXAM: MRI ABDOMEN WITHOUT AND WITH CONTRAST TECHNIQUE: Multiplanar multisequence MR imaging of the abdomen was performed both before and after the administration of intravenous contrast. CONTRAST:  5mL GADAVIST GADOBUTROL 1 MMOL/ML IV SOLN COMPARISON:  CT of 1 day prior. FINDINGS: Portions of exam are mild to moderately motion degraded. Lower chest: Mild cardiomegaly, without pericardial or pleural effusion. Hepatobiliary: Normal liver. Normal gallbladder, without biliary ductal dilatation. Pancreas: No main pancreatic duct dilatation. Corresponding the CT abnormality, within the pancreatic body/tail junction, is a cystic lesion which measures up to 9 mm on 19/24. No suspicious postcontrast characteristics including on 42/19. No peripancreatic edema. Spleen:  Normal in size, without focal abnormality. Adrenals/Urinary Tract: Normal adrenal glands. Dominant lower pole right renal cyst at 6.3 cm does not warrant imaging follow-up. Normal left kidney. No hydronephrosis. Stomach/Bowel: Normal stomach and small bowel. Colonic obstruction as  detailed on CT. Vascular/Lymphatic: Aortic atherosclerosis. No retroperitoneal or retrocrural adenopathy. Other:  No ascites. Musculoskeletal: No acute osseous abnormality. IMPRESSION: 1. Mild to moderately motion degraded exam. 2. 9 mm cystic lesion within the pancreatic body/tail junction with differential considerations of pseudocyst or indolent cystic neoplasm. Per consensus criteria, this warrants follow-up with pre and post contrast abdominal MRI at 1 year. This recommendation follows ACR consensus guidelines: Management of Incidental Pancreatic Cysts: A White Paper of the ACR Incidental Findings Committee. J Am Coll Radiol 2017;14:911-923. 3.  Aortic Atherosclerosis (ICD10-I70.0). 4. Colonic obstruction, as on CT. Electronically Signed   By: Jeronimo Greaves M.D.   On: 09/28/2023 08:52   CT ABDOMEN PELVIS W CONTRAST  Result Date: 09/27/2023 CLINICAL DATA:  Acute abdominal pain. EXAM: CT ABDOMEN AND PELVIS WITH CONTRAST TECHNIQUE: Multidetector CT imaging of the abdomen and pelvis was performed using the standard protocol following bolus administration of intravenous contrast. RADIATION DOSE REDUCTION: This exam was performed according to the departmental dose-optimization program which includes automated exposure control, adjustment of the mA and/or kV according to patient size and/or use of iterative reconstruction technique. CONTRAST:  OMNIPAQUE IOHEXOL 300 MG/ML  SOLN COMPARISON:  CT abdomen and pelvis 09/20/2023 FINDINGS: Lower chest: No acute abnormality. Hepatobiliary: There is diffuse fatty infiltration of the liver. Gallbladder and bile ducts are within normal limits. Pancreas: There is a rounded hypodensity in the tail of the pancreas measuring 9 mm which appears unchanged. No pancreatic ductal dilatation or surrounding inflammatory changes. Spleen: Normal in size without focal abnormality. Adrenals/Urinary Tract: Mildly complex right renal cyst is again seen with thin peripheral septation.  This measures 6.9 cm and appears unchanged. Otherwise, the bilateral kidneys, adrenal gland glands, and bladder are within normal limits. Stomach/Bowel: There is wall thickening of the  short segment of the proximal sigmoid colon. Upstream to this level of the colon is dilated containing air-fluid levels in the large amount of stool. The appendix is not seen. Small bowel loops and stomach are decompressed. Vascular/Lymphatic: Aortic atherosclerosis. No enlarged abdominal or pelvic lymph nodes. Reproductive: Status post hysterectomy. No adnexal masses. Other: There is a small amount of fluid in the pelvis. There is no focal abdominal wall hernia. There is no free air. Musculoskeletal: Subcentimeter lucent lesion in the L1 vertebral body, indeterminate. No acute fractures are seen. IMPRESSION: 1. Wall thickening of the proximal sigmoid colon worrisome for neoplasm. Focal colitis not excluded. There is upstream colonic dilatation with air-fluid levels and large amount of stool compatible with obstruction. 2. Small amount of free fluid in the pelvis. 3. Fatty infiltration of the liver. 4. Stable 9 mm cystic lesion in the tail of the pancreas. This can be further evaluated with MRI. 5. Stable mildly complex right renal cyst. No follow-up imaging recommended. 6. Subcentimeter lucent lesion in the L1 vertebral body, indeterminate. Aortic Atherosclerosis (ICD10-I70.0). Electronically Signed   By: Darliss Cheney M.D.   On: 09/27/2023 22:27   CT ABDOMEN PELVIS W CONTRAST  Result Date: 09/20/2023 CLINICAL DATA:  Severe lower abdominal pain. EXAM: CT ABDOMEN AND PELVIS WITH CONTRAST TECHNIQUE: Multidetector CT imaging of the abdomen and pelvis was performed using the standard protocol following bolus administration of intravenous contrast. RADIATION DOSE REDUCTION: This exam was performed according to the departmental dose-optimization program which includes automated exposure control, adjustment of the mA and/or kV  according to patient size and/or use of iterative reconstruction technique. CONTRAST:  OMNIPAQUE IOHEXOL 300 MG/ML  SOLN COMPARISON:  01/19/2006. FINDINGS: Lower chest: Mild atelectasis or scarring at the lung bases. Hepatobiliary: No focal liver abnormality is seen. Fatty infiltration of the liver is noted. No gallstones, gallbladder wall thickening, or biliary dilatation. Pancreas: Unremarkable. No pancreatic ductal dilatation or surrounding inflammatory changes. Spleen: Normal in size without focal abnormality. Adrenals/Urinary Tract: The adrenal glands are within normal limits. The kidneys enhance symmetrically. A cyst is noted in the lower pole of the right kidney. Subcentimeter hypodensities are present in the left kidney which are too small to further characterize. A nonobstructive left renal calculus is seen. No hydroureteronephrosis bilaterally. Stomach/Bowel: Stomach is within normal limits. No bowel obstruction, free air, or pneumatosis. Scattered diverticula are noted along the colon. A moderate amount of retained stool is present in the colon. The appendix is not seen. Multiple loops of small bowel are noted in the mid left abdomen and pelvis with wall thickening, mucosal enhancement, and surrounding fat stranding. There is associated wall thickening and fat stranding at the sigmoid colon. Vascular/Lymphatic: Aortic atherosclerosis. No enlarged abdominal or pelvic lymph nodes. Reproductive: Status post hysterectomy. No adnexal masses. Other: A small amount of free fluid is noted in the pelvis and presacral space. Musculoskeletal: Degenerative changes are present in the thoracolumbar spine. There stable deformity of the iliac bone on the left. No acute osseous abnormality. IMPRESSION: 1. Bowel wall thickening, mucosal enhancement, and surrounding fat stranding involving loops of small bowel in the mid left abdomen and pelvis with associated involvement of the sigmoid colon, possible infectious or  inflammatory enteritis/colitis. No bowel obstruction is seen. 2. Moderate amount of retained stool in the colon suggesting constipation. 3. Diverticulosis without diverticulitis. 4. Nonobstructive left renal calculus. 5. Hepatic steatosis. 6. Aortic atherosclerosis. Electronically Signed   By: Thornell Sartorius M.D.   On: 09/20/2023 21:52   DG Chest  Port 1 View  Result Date: 09/20/2023 CLINICAL DATA:  Sepsis.  Fever. EXAM: PORTABLE CHEST 1 VIEW COMPARISON:  04/09/2016 FINDINGS: The cardiomediastinal contours are normal. The lungs are clear. Pulmonary vasculature is normal. No consolidation, pleural effusion, or pneumothorax. No acute osseous abnormalities are seen. IMPRESSION: No active disease. Electronically Signed   By: Narda Rutherford M.D.   On: 09/20/2023 20:39    ASSESSMENT & PLAN:  82 year old female with past medical history of atrial fibrillation, not on AC, presented with acute colitis/diverticulitis, sigmoid colon stricture and bowel perforation/abscess, status post sigmoid colon resection and small bowel resection, complicated with acute PE requiring heparin infusion around her surgery, developed significant anemia after surgery.  No frank bleeding.  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  She developed significant anemia after her surgery and heparin infusion, concerning for blood loss anemia.  I agree with image to rule out a hematoma. -She has received a blood transfusion, if she does not respond well, or her hematuria persists/gets worse, it would be reasonable to hold heparin.  If we are not able to restart heparin due to her bleeding and anemia, then we will consider IR for IVC filter placement. -Although her ferritin was normal, her serum iron and saturation were  low, she probably would benefit from IV iron.  I will order Feraheme 510 mg one dose.  I discussed benefit and the potential side effect, especially allergy reaction, she agrees to proceed. -Will also order haptoglobin, LDH, and Coombs test to rule out hemolytic anemia. -I will follow-up.  I reviewed the above with Dr. Jonathon Bellows.   All questions were answered. The patient knows to call the clinic with any problems, questions or concerns.      Malachy Mood, MD 10/04/2023 4:03 PM

## 2023-10-04 NOTE — Progress Notes (Signed)
3 Days Post-Op   Subjective/Chief Complaint: Looks worn out today and like she doesn't feel well.  C/o pressure in her pelvis today like she needs to have a BM.  Has an emesis bag with a small bit of bilious emesis.  Patient pulled NGT out overnight.  Admits to nausea "when they move me"   Objective: Vital signs in last 24 hours: Temp:  [97.5 F (36.4 C)-98.5 F (36.9 C)] 98.3 F (36.8 C) (10/24 0901) Pulse Rate:  [68-93] 87 (10/24 0901) Resp:  [16-18] 16 (10/24 0901) BP: (117-132)/(49-64) 132/55 (10/24 0901) SpO2:  [92 %-100 %] 100 % (10/24 0901) Weight:  [64.5 kg] 64.5 kg (10/24 0500) Last BM Date : 10/03/23  Intake/Output from previous day: 10/23 0701 - 10/24 0700 In: 801.9 [P.O.:30; I.V.:771.9] Out: 350 [Urine:100; Emesis/NG output:250] Intake/Output this shift: Total I/O In: 324 [Blood:324] Out: 200 [Urine:200]  General appearance: ill GI: inc c/d/I, and packed, ostomy patent with no output currently except gas. Stoma is viable.    Lab Results:  Recent Labs    10/02/23 0408 10/03/23 0345  WBC 13.7* 16.8*  HGB 10.2* 8.6*  HCT 31.4* 25.8*  PLT 314 325   BMET Recent Labs    10/03/23 0345 10/04/23 0359  NA 137 140  K 4.3 4.0  CL 105 107  CO2 24 26  GLUCOSE 176* 133*  BUN 30* 30*  CREATININE 0.59 0.58  CALCIUM 8.1* 8.5*   PT/INR No results for input(s): "LABPROT", "INR" in the last 72 hours. ABG No results for input(s): "PHART", "HCO3" in the last 72 hours.  Invalid input(s): "PCO2", "PO2"  Studies/Results: No results found.  Anti-infectives: Anti-infectives (From admission, onward)    Start     Dose/Rate Route Frequency Ordered Stop   09/27/23 2315  piperacillin-tazobactam (ZOSYN) IVPB 3.375 g  Status:  Discontinued        3.375 g 12.5 mL/hr over 240 Minutes Intravenous Every 8 hours 09/27/23 2305 10/02/23 1106       Assessment/Plan: POD 3, s/p ex lap, Harmtman's resection and colostomy, and small bowel resection, Dr. [name]  10/01/23 -leave NGT out for now, but with som bilious emesis will check a plain film.  She may need to have NGT reinserted, but she states she will not agree to that.  We discussed aspiration risks etc. - WTD dressing changes -PT, rec HHPT right now, but OT SNF.  I suspect she will likely need SNF placement by the time she is ready for DC -heparin gtt for new PEs -discussed situation with primary service  FEN: NPO/IVFs VTE: heparin gtt, may need to hold for anemia, but will follow hgb trend for now.  D/w primary team ID: none currently needed  PEs - on heparin gtt A fib HTN Anemia - likely ABL at this point.  Getting 1 unit today.  Recheck and follow hgb.   LOS: 7 days    Letha Cape 10/04/2023

## 2023-10-04 NOTE — Progress Notes (Addendum)
PROGRESS NOTE Elizabeth Odom  ZOX:[id number] DOB: 07/07/1941 DOA: 09/27/2023 PCP: [name], FNP  Brief Narrative/Hospital Course: 82 year old female with history of A-fib, RLS, fall risk, anemia who was seen in the ED with last week on 09/20/23 for abdominal pain Suprapubic, decreased appetite and also fever and in ED had CT done that showed colitis and has been on Augmentin until 2 days PTA and was also told to increase her MiraLAX, the nshe had mild symptomatic improvement on 10/16 but had had severe recurrence of her pain and intolerance to oral intake and 3 episodes of nonbilious and nonbloody diarrhea so return to the ED on 09/27/2023. Patient reported diarrhea since he had been on antibiotics initially suprapubic pain now all over unable to keep anything down due to nausea vomiting.  Last colonoscopy probably 10 years ago. In the ED: Vital stable labs showed leukocytosis 15.5. CT ABD W/ contrast>>Wall thickening of the proximal sigmoid colon worrisome for neoplasm. Focal colitis not excluded. There is upstream colonic dilatation with air-fluid levels and large amount of stool compatible with obstruction. Small amount of free fluid in the pelvis. Fatty infiltration of the liver.  9 cm cystic lesion in the tail of the pancreas.  Subcentimeter lucent lesion in the L1 vertebral body indeterminate. Seen by GI- refused colonoscopy agreed for sigmoidoscopy> done 09/29/23>shows very tight structure versus mass in the rectosigmoid could not pass ultrathin colonoscope or even endoscope,surgery consulted.CCS discussed over of uncertain situation tumor like a mass versus possible colitis versus possible ischemic colitis and after extensive discussion with patient and patient' underwent ex laparotomy with Hartman's resection and colostomy and small bowel resection 10/21. Patient was noticed to have PE acute on CT chest and subsequently started on heparin-echo unremarkable and hemodynamically  stable Hemoglobin downtrending> dropped to  6.8 gm- 1 unit prbc done 10/24  Subjective: Seen and examined this morning Alert awake off NG tube, taking ice chips Abdomen pain is about the same not any worse.  Reports urine is dark Denies chest pain nausea vomiting -Hemodynamically stable, not hypoxic, is afebrile Hemoglobin downtrending> dropped to  6.8 gm- 1 unit prbc started 10/24 am CBC shows WBC trending up again  to 19k., UA grossly abnormal with RBC >50 WBC 6-10 Ng output 250 cc, colostomy output 10/23 was 550 cc  Assessment and Plan: Principal Problem:   Acute colitis   Abdominal pain Large bowel obstruction due to recto-sigmoid/sigmoid stricture:? inflammatory versus mass Possible acute colitis and diarrhea: Seen by GI- refused colonoscopy but agreed for sigmoidoscopy> done 09/29/23>shows very tight structure versus mass in the rectosigmoid could not pass ultrathin colonoscope or even endoscope,surgery consulted.CCS discussed over of uncertain situation tumor like a mass versus possible colitis versus possible ischemic colitis and after extensive discussion with patient and patient' underwent ex laparotomy with Hartman's resection and colostomy and small bowel resection 10/21. Continue NG tube suction, n.p.o., IV fluid hydration, mobilize, continue pain control.   Surgery following closely.  Biopsy pending . Patient having leukocytosis and further trending up, was on Zosyn 10/17- 10/22 given her diarrhea on presentation and discontinued once no longer felt needed per surgery post op.  Likely component of reactive, monitor closely low threshold for antibiotics if fever or any other signs of infection.  Leukocytosis: See above. Recent Labs  Lab 09/29/23 0647 10/01/23 0326 10/02/23 0408 10/03/23 0345 10/04/23 0359  WBC 12.0* 11.3* 13.7* 16.8* 19.4*    Anemia normocytic acute on chronic ABLA Dark brown urine/Hematuria: Initial hemoglobin likely hemoconcentration falsely  high, Hb drifting down to 6.8-1 unit PRBC ordered, suspect ABLA- multifactorial in the setting of postop, Op note 10/21- EBL 450 cc, urine dark-rbc> 50. Check anemia panel, Hemoccult. Do bladder scan q6h. Patient on heparin drip for PE -if no appropriate response to transfusion will need to hold heparin-I discussed with general surgery  as well as patient's daughter-repeating H&H this afternoon and monitoring serially, abdomen exam is benign doubt intra-abdominal bleeding per CCS  but could have oozing. Vitals stable, transfuse further if < 7gm Recent Labs  Lab 10/01/23 0326 10/02/23 0408 10/03/23 0345 10/03/23 2124 10/04/23 0359  HGB 13.1 10.2* 8.6* 7.0* 6.8*  HCT 40.3 31.4* 25.8* 21.4* 21.2*   9 mm Pancreatic cyst: S/p MRI>showed pancreatic cyst in the pancreatic body/tail junction with differential considerations of pseudocyst or indolent cystic neoplasm. Per consensus criteria, this warrants follow-up with pre and post contrast abdominal MRI at 1 year.  Nodule in the inferior aspect of the isthmus will need thyroid ultrasound not emergently.  Hyperglycemia Prediabetes with A1c 5.8: Blood sugar is well-controlled monitor Accu-Chek    Hyponatremia: Hypokalemia: Electrolytes stable.  Small to moderate-sized Acute PE w/o right heart strain: On review of CT chest from 10/20 per radiology- found to have-nonocclusive filling defect in segmental pulmonary artery of right upper lobe right lower lobe and left upper lobe-compatible with acute small to moderate volume PE: Discussed with  Dr. [name]  and started on heparin drip-pharmacy monitoring, denies chest pain and TTE stable EF no right heart dysfunction.  Continue heparin monitor hemoglobin closely-if hemoglobin does not improve appropriately unfortunately we will have to hold heparin-patient daughter aware and agreeable. Will check duplex of the legs as well.  Addendum: Duplex reviewed-acute DVT of left popliteal and left posterior  tibial vein, left peroneal vein and with acute intramuscular thrombosis of left gastrocnemius vein-given patient's anemia along with DVT PE heme-onc consulted-Dr. Mosetta Putt will see the patient No urine retention on the bladder scan and voiding still dark /and with blood, I will consult with urology Recheck hemoglobin at 5 PM to make sure holding stable if further dropping will need to consider holding anticoagulation but await further oncology input. Discussed w/ Urology-Cam-advised to obtain CT hematuria t [place],creatine is stable.  DVT prophylaxis: SCD's Start: 10/01/23 1141 Place TED hose Start: 10/01/23 1141 Code Status:   Code Status: Full Code Family Communication: plan of care discussed with patient/daughter was updated at bedside 10/23. I called to update daughter 10/24 and all questions were answered  Patient status ZO:[id number] because of large bowel obstruction Level of care: Telemetry  Dispo: The patient is from: Home, independent            Anticipated disposition:  Hhpt. PTOT WORKING  Objective: Vitals last 24 hrs: Vitals:   10/04/23 0500 10/04/23 0607 10/04/23 0628 10/04/23 0901  BP:  (!) 123/52 (!) 122/49 (!) 132/55  Pulse:  68 79 87  Resp:  18 18 16   Temp:  (!) 97.5 F (36.4 C) 97.8 F (36.6 C) 98.3 F (36.8 C)  TempSrc:  Oral Oral   SpO2:  92% 98% 100%  Weight: 64.5 kg     Height:       Weight change: 0 kg  Physical Examination: General exam: alert awake, pleasant HEENT:Oral mucosa moist, Ear/Nose WNL grossly Respiratory system: Bilaterally clear BS,no use of accessory muscle Cardiovascular system: S1 & S2 +, No JVD. Gastrointestinal system: Abdomen soft, incision site clean dry intact, packing in ostomy on the left side. BS hypoactive Nervous  System: Alert, awake, moving all extremities,and following commands. Extremities: LE edema neg,distal peripheral pulses palpable and warm.  Skin: No rashes,no icterus. MSK: Normal muscle bulk,tone, power    Medications reviewed:  Scheduled Meds:  acidophilus  2 capsule Oral TID   bisacodyl  10 mg Rectal Once   Chlorhexidine Gluconate Cloth  6 each Topical Q0600   insulin aspart  0-9 Units Subcutaneous Q4H   mupirocin ointment  1 Application Nasal BID  Continuous Infusions:  dextrose 5 % and 0.9 % NaCl 40 mL/hr at 10/04/23 0941   heparin 1,100 Units/hr (10/03/23 1550)    Diet Order             Diet NPO time specified Except for: Ice Chips  Diet effective now                  Intake/Output Summary (Last 24 hours) at 10/04/2023 1115 Last data filed at 10/04/2023 0853 Gross per 24 hour  Intake 554.28 ml  Output 550 ml  Net 4.28 ml   Net IO Since Admission: 2,756.63 mL [10/04/23 1115]  Wt Readings from Last 3 Encounters:  10/04/23 64.5 kg  09/20/23 56.2 kg  11/23/20 55.3 kg     Unresulted Labs (From admission, onward)     Start     Ordered   10/05/23 0000  Hemoglobin and hematocrit, blood  Once,   R        10/04/23 0829   10/04/23 1200  Hemoglobin and hematocrit, blood  Now then every 6 hours,   R (with TIMED occurrences)      10/04/23 0829   10/04/23 0818  Vitamin B12  (Anemia Panel (PNL))  Add-on,   AD        10/04/23 0817   10/04/23 0818  Folate  (Anemia Panel (PNL))  Add-on,   AD        10/04/23 0817   10/04/23 0818  Iron and TIBC  (Anemia Panel (PNL))  Add-on,   AD        10/04/23 0817   10/04/23 0818  Ferritin  (Anemia Panel (PNL))  Add-on,   AD        10/04/23 0817   10/04/23 0818  Reticulocytes  (Anemia Panel (PNL))  Add-on,   AD        10/04/23 0817   10/04/23 0818  Occult blood card to lab, stool  Once,   R        10/04/23 0817   10/04/23 0500  Basic metabolic panel  Daily,   R      10/03/23 1133   10/03/23 0500  Heparin level (unfractionated)  Daily,   R      10/02/23 1420   10/02/23 0500  CBC  Daily,   R      10/01/23 1328          Data Reviewed: I have personally reviewed following labs and imaging studies CBC: Recent Labs  Lab  09/28/23 0648 09/29/23 0647 10/01/23 0326 10/02/23 0408 10/03/23 0345 10/03/23 2124 10/04/23 0359  WBC 13.5* 12.0* 11.3* 13.7* 16.8*  --  19.4*  NEUTROABS 10.9*  --   --   --   --   --   --   HGB 12.5 13.0 13.1 10.2* 8.6* 7.0* 6.8*  HCT 38.0 39.5 40.3 31.4* 25.8* 21.4* 21.2*  MCV 95.0 93.6 94.8 94.6 94.5  --  96.4  PLT 515* 512* 409* 314 325  --  263   Basic Metabolic Panel: Recent  Labs  Lab 10/01/23 0326 10/02/23 0408 10/02/23 1944 10/03/23 0345 10/04/23 0359  NA 133* 137 135 137 140  K 3.9 3.8 4.0 4.3 4.0  CL 97* 104 103 105 107  CO2 25 27 26 24 26   GLUCOSE 109* 249* 146* 176* 133*  BUN 16 19 34* 30* 30*  CREATININE 0.82 0.92 0.68 0.59 0.58  CALCIUM 8.7* 8.0* 7.8* 8.1* 8.5*  MG  --   --   --  1.8  --   PHOS  --   --   --  2.0*  --    GFR: Estimated Creatinine Clearance: 50.8 mL/min (by C-G formula based on SCr of 0.58 mg/dL). Liver Function Tests: Recent Labs  Lab 09/27/23 2007  AST 23  ALT 28  ALKPHOS 91  BILITOT 0.6  PROT 7.8  ALBUMIN 3.7   Recent Labs  Lab 09/27/23 2007  LIPASE 41   No results for input(s): "HGBA1C" in the last 72 hours. CBG: Recent Labs  Lab 10/03/23 1650 10/03/23 1935 10/04/23 0007 10/04/23 0356 10/04/23 0726  GLUCAP 161* 142* 121* 130* 121*   Recent Results (from the past 240 hour(s))  Surgical pcr screen     Status: Abnormal   Collection Time: 10/01/23  4:19 AM   Specimen: Nasal Mucosa; Nasal Swab  Result Value Ref Range Status   MRSA, PCR POSITIVE (A) NEGATIVE Final    Comment: RESULT CALLED TO, READ BACK BY AND VERIFIED WITH: NGUYEN, T. RN AT [phone] ON 10/01/2023 BY MECIAL J.    Staphylococcus aureus POSITIVE (A) NEGATIVE Final    Comment: (NOTE) The Xpert SA Assay (FDA approved for NASAL specimens in patients 65 years of age and older), is one component of a comprehensive surveillance program. It is not intended to diagnose infection nor to guide or monitor treatment. Performed at [facility],  [address], [place], [place] [address]     Antimicrobials: Anti-infectives (From admission, onward)    Start     Dose/Rate Route Frequency Ordered Stop   09/27/23 2315  piperacillin-tazobactam (ZOSYN) IVPB 3.375 g  Status:  Discontinued        3.375 g 12.5 mL/hr over 240 Minutes Intravenous Every 8 hours 09/27/23 2305 10/02/23 1106      Culture/Microbiology    Component Value Date/Time   SDES  09/20/2023 1930    BLOOD BLOOD RIGHT HAND Performed at [facility], [address] [place], [place] [address]    SPECREQUEST  09/20/2023 1930    BOTTLES DRAWN AEROBIC AND ANAEROBIC Blood Culture results may not be optimal due to an inadequate volume of blood received in culture bottles Performed at [facility], [address], [place], [place] [address]    CULT  09/20/2023 1930    NO GROWTH 5 DAYS Performed at [facility], [address] [place], [place] [address]    REPTSTATUS 09/25/2023 FINAL 09/20/2023 1930  Radiology Studies: DG Abd Portable 1V  Result Date: 10/04/2023 CLINICAL DATA:  Nausea and vomiting. EXAM: PORTABLE ABDOMEN - 1 VIEW COMPARISON:  09/27/2019 for FINDINGS: The bowel gas pattern has improved from the previous exam. There is been interval decrease in previously noted colonic distension. Within the left lower quadrant of the abdomen there are several dilated loops of small bowel which measure up to 3 mm, similar to the previous CT. IMPRESSION: 1. Interval improvement in diffuse colonic distension. 2. Persistent mild dilatation of small bowel loops in the left lower quadrant of the  abdomen. Electronically Signed   By: Signa Kell M.D.   On: 10/04/2023 11:00     LOS: 7 days   Lanae Boast, MD [facility]  10/04/2023, 11:15 AM

## 2023-10-04 NOTE — Progress Notes (Signed)
Pharmacy Antibiotic Note  Elizabeth Odom is a 82 y.o. female admitted on 09/27/2023 with  wound infection .  Pharmacy has been consulted for zosyn dosing.  Plan: Zosyn 3.375g IV q8h (4 hour infusion). Will sign off  Height: 5\' 6"  (167.6 cm) Weight: 64.5 kg (142 lb 3.2 oz) IBW/kg (Calculated) : 59.3  Temp (24hrs), Avg:98 F (36.7 C), Min:97.5 F (36.4 C), Max:98.5 F (36.9 C)  Recent Labs  Lab 09/29/23 0647 10/01/23 0326 10/02/23 0408 10/02/23 1944 10/03/23 0345 10/04/23 0359  WBC 12.0* 11.3* 13.7*  --  16.8* 19.4*  CREATININE 0.83 0.82 0.92 0.68 0.59 0.58    Estimated Creatinine Clearance: 50.8 mL/min (by C-G formula based on SCr of 0.58 mg/dL).    Allergies  Allergen Reactions   Elemental Sulfur Anaphylaxis      Thank you for allowing pharmacy to be a part of this patient's care.  Berkley Harvey 10/04/2023 6:27 PM

## 2023-10-04 NOTE — Progress Notes (Addendum)
0430Anthoney Harada NP notified about Hemoglobin 6.8. new order given to transfuse 1 unit RBC. MD notified about brown bloody urine. UA sent to lab. No other active bleeding noted.

## 2023-10-04 NOTE — Significant Event (Signed)
Called by Radiology regarding CT abd findings: ?pSBO w/ dilated proximal jejunum, significant adjacent presumed hematoma in pelvis see report. I discussed w/ Dr Carolynne Edouard immediately-he reviewed the scan Advising to hold off heparin, transfuse 1 more unit prbc, zosyn empirically for now. May need NGT- Csurgery to decide- Dr Carolynne Edouard coming to evaluate She had vomiting before CT scan. IR consulted for IVC filter. I had discussed with patient/family about potential need to hold off on heparin understanding risks/benefits and were agreeable, hem onc was agreeable as well. We will transfer to step down/ICU unit for close monitoring, she is at high risk for decompensation. I have consulted ICU team, Hoffmann/Dr Icard

## 2023-10-04 NOTE — Progress Notes (Signed)
Bilateral lower extremity venous duplex has been completed. Preliminary results can be found in CV Proc through chart review.  Results were given to the patient's nurse, Kara Mead.  10/04/23 12:55 PM Olen Cordial RVT

## 2023-10-04 NOTE — Consult Note (Signed)
NAME:  Elizabeth Odom, MRN:  [id number], DOB:  07/21/1941, LOS: 7 ADMISSION DATE:  09/27/2023, CONSULTATION DATE:  10/24 REFERRING MD:  Dr. Jonathon Bellows, CHIEF COMPLAINT:  Anemia   History of Present Illness:  82 year old female with past medical history as below, which is significant for cervical cancer, hypertension, and atrial fibrillation not on anticoagulation.  She she initially presented to the emergency department on 10/10 with complaints of abdominal pain.  Workup was consistent with constipation and enteritis/colitis.  She was discharged with prescription for Augmentin and MiraLAX.  Overall she felt better but abdominal pain persisted.  She again presented to the emergency department on 10/17 with similar complaints.    CT showed diffuse fatty infiltration of the liver, hypodensity in the tail of pancreas, and large bowel obstruction in the rectosigmoid region.  She had MRCP on 10/18 which showed no main pancreatic duct dilation and cystic lesion of the pancreas concerning for pseudocyst versus neoplasm.  She had a flexible sigmoidoscopy on 10/19 which was unable to be passed beyond the area of obstruction.  Surgery was consulted on 10/19 as well and recommended ex lap and sigmoid colon resection.  10/20 the patient had a CT of the chest with contrast for staging of potential colon cancer and was noted to have segmental filling defects consistent with PE.  Heparin infusion was started.  10/21 she underwent ex lap with Hartman's resection and colostomy as well as small bowel resection. Biopsies from scope and surgery both negative for malignancy.   Postoperative course has been initially been stable with n.p.o. status and nasogastric tube decompression.  The patient self-discontinued her NGT and refused replacement. It did become complicated by hemoglobin drift.  Hemoglobin was 13.1 on the day of surgery and has progressively declined to 6 point 8 in the AM hours of 10/24.  The patient was transfused 1  unit of PRBCs and heparin infusion was held.  CT of the abdomen was ordered for the purpose of determining source of bleeding.  CT was concerning for what is presumed to be hematoma in the pelvis posterior as well as postop gas expected versus dehiscence.  The hospitalist and general surgeons discussed the results and decided to transfuse another unit of PRBC and transferred to the stepdown unit for ongoing care.  PCCM was consulted for further evaluation.  On my evaluation, she reports she had nausea during her scan earlier but this has resolved. She reports pressure and discomfort in her lower abdomen. She hasn't noticed any melena or hematochezia in her ostomy. She denies any chest pain, pressure or tightness. She has no new fever, chills or sweats. No new dyspnea or cough. She reports her mouth and throat are dry. She is quite thirsty. She has a mild frontal headache but no focal vision changes.   Pertinent  Medical History   has a past medical history of A-fib (HCC) (04/09/2016), Acute head injury (04/09/2016), Cervical cancer (HCC), Cervical spine fracture, initial encounter (04/09/2016), Essential hypertension, History of anemia, Multiple fractures of cervical spine (HCC) (04/09/2016), PAC (premature atrial contraction), Restless leg, and Syncope and collapse (04/09/2016).   Significant Hospital Events: Including procedures, antibiotic start and stop dates in addition to other pertinent events   10/17 admit for bowel obstruction, pancreatic cyst 10/19 flex sig, unable to advance scope past obstruction.  10/21 to OR for hartmans procedure and small bowel resection.   Interim History / Subjective:  N/A  Objective   Blood pressure (!) 103/57, pulse 100, temperature 97.9  F (36.6 C), resp. rate 15, height 5\' 6"  (1.676 m), weight 64.5 kg, SpO2 98%.        Intake/Output Summary (Last 24 hours) at 10/04/2023 1840 Last data filed at 10/04/2023 1630 Gross per 24 hour  Intake 816.33 ml  Output 750  ml  Net 66.33 ml   Filed Weights   10/02/23 0500 10/03/23 0500 10/04/23 0500  Weight: 61.1 kg 64.5 kg 64.5 kg    Examination: General:  Awake. Alert. No acute distress. Sitting in bed. Daughter, granddaughter, and greatgrandchildren at bedside.  Integument:  Warm & dry. No rash or bruising on exposed skin. Abdominal dressing is clean and dry. Extremities:  No cyanosis or clubbing.  Lymphatics:  No appreciated cervical or supraclavicular lymphadenoapthy. HEENT:  Tacky mucus membranes. No oral ulcers. No scleral injection or icterus.  Cardiovascular:  Regular rate. No edema. No murmur appreciated.  Pulmonary:  Good aeration. Clear to auscultation bilaterally. Symmetric chest wall expansion on. No accessory muscle use. Abdomen: Soft. Hypoactive bowel sounds. Protuberant. No voluntary guarding. Musculoskeletal:  Normal bulk and tone. Hand grip strength 5/5 bilaterally. No joint deformity or effusion appreciated. Neurological:  CN 2-12 grossly in tact. No meningismus. Moving all 4 extremities equally.  Psychiatric:  Mood and affect congruent. Speech normal rhythm, rate & tone. Oriented x3.  Resolved Hospital Problem list   Small Bowel Obstruction  Assessment & Plan:  82 y.o. female with a past medical history of hypertension anterior and 8 fibrillation.  Patient presented to hospital with bowel obstruction and ultimately required resection.  Pulmonary embolism was identified incidentally and patient was started on systemic anticoagulation with a heparin drip.  Patient suffered spontaneous intra-abdominal hemorrhage with hematoma identified on CT imaging of the abdomen/pelvis.  Critical Care consult was requested for transfer to the ICU/stepdown.  1.  Intra-abdominal hemorrhage: Occurred following bowel resection on systemic anticoagulation with heparin drip.  General surgery following.  Heparin has been stopped.  Administering IV protamine for heparin reversal.  Trending hemoglobin/hematocrit  every 4 hours with target hemoglobin  9-10 per general surgery recommendations.  Awaiting transfusion of an additional 1 unit PRBCs and follow-up hemoglobin/hematocrit.  2.  Acute blood loss anemia: Secondary to hemoperitoneum in the post-surgical setting and on anticoagulation. Trending hemoglobin/hematocrit every 4 hours with target hemoglobin  9-10 per general surgery recommendations.  Awaiting transfusion of an additional 1 unit PRBCs and follow-up hemoglobin/hematocrit.   3.  Segmental pulmonary embolism: Identified incidentally on CT imaging.  Left lower extremity calf vein DVT identified on vascular ultrasound today.  Holding heparin drip with intra-abdominal hemorrhage.  Interventional radiology has been consulted for IVC filter.  4.  Large bowel obstruction: Status post surgical resection and colostomy 10/21.  General surgery following and guiding postoperative care.  Continuing sips and chips.  Patient currently on empiric antimicrobial coverage with Zosyn.  5.  Atrial fibrillation: Holding heparin drip given intra-abdominal hemorrhage.  Monitoring patient on telemetry.  6.  Hypertension: Monitoring vitals per unit protocol.  Holding antihypertensive regimen in the setting of intra-abdominal hemorrhage.  7.  Prediabetes: Hemoglobin A1c 5.8.  Monitoring glucose with Accu-Cheks.  8.  CODE STATUS: Discussed with the patient, her daughter, and her granddaughter at bedside.  Patient desires DNI/DNR CODE STATUS.  Order changed within EMR.   Best Practice (right click and "Reselect all SmartList Selections" daily)   Diet/type: NPO w/ oral meds DVT prophylaxis: not indicated GI prophylaxis: N/A Lines: N/A Foley:  N/A Code Status:  DNR Last date of multidisciplinary goals of  care discussion [N/A]  Labs   CBC: Recent Labs  Lab 09/28/23 0648 09/29/23 0647 10/01/23 0326 10/02/23 0408 10/03/23 0345 10/03/23 2124 10/04/23 0359 10/04/23 1239 10/04/23 1608  WBC 13.5* 12.0* 11.3*  13.7* 16.8*  --  19.4*  --   --   NEUTROABS 10.9*  --   --   --   --   --   --   --   --   HGB 12.5 13.0 13.1 10.2* 8.6* 7.0* 6.8* 7.4* 7.5*  HCT 38.0 39.5 40.3 31.4* 25.8* 21.4* 21.2* 23.2* 23.4*  MCV 95.0 93.6 94.8 94.6 94.5  --  96.4  --   --   PLT 515* 512* 409* 314 325  --  263  --   --     Basic Metabolic Panel: Recent Labs  Lab 10/01/23 0326 10/02/23 0408 10/02/23 1944 10/03/23 0345 10/04/23 0359  NA 133* 137 135 137 140  K 3.9 3.8 4.0 4.3 4.0  CL 97* 104 103 105 107  CO2 25 27 26 24 26   GLUCOSE 109* 249* 146* 176* 133*  BUN 16 19 34* 30* 30*  CREATININE 0.82 0.92 0.68 0.59 0.58  CALCIUM 8.7* 8.0* 7.8* 8.1* 8.5*  MG  --   --   --  1.8  --   PHOS  --   --   --  2.0*  --    GFR: Estimated Creatinine Clearance: 50.8 mL/min (by C-G formula based on SCr of 0.58 mg/dL). Recent Labs  Lab 10/01/23 0326 10/02/23 0408 10/03/23 0345 10/04/23 0359  WBC 11.3* 13.7* 16.8* 19.4*    Liver Function Tests: Recent Labs  Lab 09/27/23 2007  AST 23  ALT 28  ALKPHOS 91  BILITOT 0.6  PROT 7.8  ALBUMIN 3.7   Recent Labs  Lab 09/27/23 2007  LIPASE 41   No results for input(s): "AMMONIA" in the last 168 hours.  ABG    Component Value Date/Time   TCO2 25 04/08/2016 2316     Coagulation Profile: No results for input(s): "INR", "PROTIME" in the last 168 hours.  Cardiac Enzymes: No results for input(s): "CKTOTAL", "CKMB", "CKMBINDEX", "TROPONINI" in the last 168 hours.  HbA1C: Hgb A1c MFr Bld  Date/Time Value Ref Range Status  09/28/2023 12:25 AM 5.8 (H) 4.8 - 5.6 % Final    Comment:    (NOTE) Pre diabetes:          5.7%-6.4%  Diabetes:              >6.4%  Glycemic control for   <7.0% adults with diabetes     CBG: Recent Labs  Lab 10/04/23 0007 10/04/23 0356 10/04/23 0726 10/04/23 1237 10/04/23 1548  GLUCAP 121* 130* 121* 155* 123*   IMAGING: BILATERAL LE VENOUS DUPLEX 10/04/23:  Negative in right leg. DVT involving left popliteal, posterior  tibial and peroneal veins. Findings consistent with acute intramuscular thrombosis of the left gastrocnemius veins.  CT HEMATURIA WORKUP 10/04/23 (per radiologist):  Surgical changes identified since the prior examination consistent with the provided history of partial left colectomy, colostomy with a Hartmann's pouch procedure and associated small bowel resection with anastomosis. Few scattered areas of free air are as per surgery. The suture line of the small bowel in the pelvis is a dilated loop of bowel with mixed density including some intermediate areas in the lumen. This bowel loop is dilated to 7.1 cm in the proximal jejunum and duodenal is dilated with fluid. This could be a focal developing obstruction. The  more distal small bowel is nondilated. In addition there is significant adjacent presumed hematoma in the pelvis posterior to the suture line and on the coronal images the suture line is somewhat indistinct along its course. With level of dilatation, the surrounding fluid, gas and hematoma, please correlate for any clinical evidence of a leak or dehiscence versus expected postop change. Slight wall thickening along the large bowel loop extending out to the colostomy along the left side. Diffuse colonic diverticula. Tiny pleural effusions, right-greater-than-left. Punctate nonobstructing lower pole left-sided renal stone. Bosniak 2 right-sided renal cystic lesion. No enhancing renal mass or separate collecting system dilatation. Slight wall thickening of the urinary bladder but this could relate to the adjacent process. Small bubble of air in the bladder lumen could be related to previous instrumentation but please correlate with history. Known pulmonary emboli. These are somewhat poorly seen on this abdomen and pelvis hematuria workup exam  Review of Systems:   No rashes or abnormal bruising. No dysuria or hematuria. A pertinent 14 point review of systems is negative except as per HPI.  Past  Medical History:  She,  has a past medical history of A-fib (HCC) (04/09/2016), Acute head injury (04/09/2016), Cervical cancer (HCC), Cervical spine fracture, initial encounter (04/09/2016), Essential hypertension, History of anemia, Multiple fractures of cervical spine (HCC) (04/09/2016), PAC (premature atrial contraction), Restless leg, and Syncope and collapse (04/09/2016).   Surgical History:   Past Surgical History:  Procedure Laterality Date   APPENDECTOMY     BIOPSY  09/29/2023   Procedure: BIOPSY;  Surgeon: Kerin Salen, MD;  Location: [facility];  Service: Gastroenterology;;   breast lump removal Left    CERVICAL LAMINECTOMY     COLECTOMY WITH COLOSTOMY CREATION/HARTMANN PROCEDURE N/A 10/01/2023   Procedure: COLECTOMY WITH COLOSTOMY CREATION/HARTMANN PROCEDURE;  Surgeon: Axel Filler, MD;  Location: [facility];  Service: General;  Laterality: N/A;   complete hysterectomy     EP IMPLANTABLE DEVICE N/A 04/10/2016   Procedure: Loop Recorder Insertion;  Surgeon: Marinus Maw, MD;  Location: [facility];  Service: Cardiovascular;  Laterality: N/A;   FLEXIBLE SIGMOIDOSCOPY N/A 09/29/2023   Procedure: FLEXIBLE SIGMOIDOSCOPY;  Surgeon: Kerin Salen, MD;  Location: [facility];  Service: Gastroenterology;  Laterality: N/A;   HARVEST BONE GRAFT     LOOP RECORDER REMOVAL N/A 07/08/2018   Procedure: LOOP RECORDER REMOVAL;  Surgeon: Marinus Maw, MD;  Location: [facility];  Service: Cardiovascular;  Laterality: N/A;     Social History:   reports that she has never smoked. She has never used smokeless tobacco. She reports that she does not drink alcohol and does not use drugs.   Family History:  Her family history includes Cancer in her sister; Diabetes in her sister; Other in her mother; Stroke in her daughter; Thyroid disease in her daughter.   Allergies Allergies  Allergen Reactions   Elemental Sulfur Anaphylaxis     Home Medications  Prior to Admission medications    Medication Sig Start Date End Date Taking? Authorizing Provider  amoxicillin-clavulanate (AUGMENTIN) 875-125 MG tablet Take 1 tablet by mouth every 12 (twelve) hours. 09/21/23  Yes Horton, Kristie M, DO  ondansetron (ZOFRAN-ODT) 4 MG disintegrating tablet Take 4 mg by mouth every 8 (eight) hours as needed for vomiting or nausea. 09/16/23  Yes [provider]  meclizine (ANTIVERT) 25 MG tablet Take 25 mg by mouth 3 (three) times daily. Patient not taking: Reported on 11/23/2020    [provider]  Omega-3  Fatty Acids (FISH OIL) 500 MG CAPS Take 1 capsule by mouth daily. Patient not taking: Reported on 09/27/2023    [provider]

## 2023-10-04 NOTE — Progress Notes (Signed)
Patient ID: EMSLEY JODON, female   DOB: 08-Feb-1941, 82 y.o.   MRN: [id number]  Ct was performed today and looks like she may have hematoma near anastamosis. The small amount of free air would be expected at this point in her postop period. On exam her abd is soft with no evidence of guarding or peritonitis but wbc is elevated. Would continue to cover with broad spectrum abx and transfuse to hg of 9-10. Hold anticoagulation for now. May need to ask IR to place ivc filter. Transfer to stepdown for closer monitoring. Will follow

## 2023-10-04 NOTE — Progress Notes (Signed)
Physical Therapy Treatment Patient Details Name: Elizabeth Odom MRN: [id number] DOB: February 01, 1941 Today's Date: 10/04/2023   History of Present Illness 82 yo female admitted with acute colitis. Pt underwent ex laparotomy with Hartman's resection and colostomy and small bowel resection 10/21. Hx of Afib, RLS, falls, syncope, collapse, cervical Ca, cervical spine fxs, anemia    PT Comments  Pt requested to use bed pan prior to mobilizing (despite attempt to provide [facility]).  Urine dark colored and pt with BM upon pericare (RN notified).  Pt unable to don socks independently.  Pt assisted with standing however unable to march in place.  Pt then reports "pressure" and "it's coming" but uncertain whether urine or BM so BSC provided.  Pt assisted to [facility] and left on [facility] with RN aware.  Pt encouraged to use BSC or bathroom and not bed pan to improve weakness and mobility as she was previously able to ambulate earlier this week.  Attempted to discuss a d/c plan however pt states she will not likely d/c for "a week."  Due to decreased mobility and increased reliance on assist, patient will benefit from continued inpatient follow up therapy, <3 hours/day.    If plan is discharge home, recommend the following: A lot of help with walking and/or transfers;A lot of help with bathing/dressing/bathroom;Assistance with cooking/housework;Help with stairs or ramp for entrance   Can travel by private vehicle     No  Equipment Recommendations  None recommended by PT    Recommendations for Other Services       Precautions / Restrictions Precautions Precautions: Fall Precaution Comments: abd surgery; colostomy Restrictions Weight Bearing Restrictions: No     Mobility  Bed Mobility Overal bed mobility: Needs Assistance Bed Mobility: Rolling, Sidelying to Sit Rolling: Contact guard assist, Used rails Sidelying to sit: Contact guard assist, HOB elevated, Used rails       General bed mobility comments: pt  utilized rail and elevated HOB to perform log roll technique, did not require cues but CGA in case of pain/nausea (pt did not require emesis bag)    Transfers Overall transfer level: Needs assistance Equipment used: Rolling walker (2 wheels) Transfers: Sit to/from Stand, Bed to chair/wheelchair/BSC Sit to Stand: Mod assist Stand pivot transfers: Min assist         General transfer comment: verbal cues for hand placement and weight shifting; performed standing x2, pt unable to march in place to ambulate but reported urge to either urinate or BM so brought over BSC to pt (when sitting), mod assist to stand and stabilize again due to posterior lean however able to pivot min assist to [facility]; denies dizzines but endorses weakness    Ambulation/Gait               General Gait Details: pt felt unable   Stairs             Wheelchair Mobility     Tilt Bed    Modified Rankin (Stroke Patients Only)       Balance Overall balance assessment: Needs assistance Sitting-balance support: No upper extremity supported, Feet supported Sitting balance-Leahy Scale: Fair Sitting balance - Comments: static fair Postural control: Posterior lean Standing balance support: Bilateral upper extremity supported, Reliant on assistive device for balance, During functional activity Standing balance-Leahy Scale: Poor Standing balance comment: reliant on UE and external assist  Cognition Arousal: Alert Behavior During Therapy: WFL for tasks assessed/performed, Flat affect Overall Cognitive Status: Within Functional Limits for tasks assessed                                          Exercises      General Comments        Pertinent Vitals/Pain Pain Assessment Pain Assessment: 0-10 Pain Score: 7  Pain Location: abdomen Pain Descriptors / Indicators: Grimacing, Operative site guarding Pain Intervention(s): Repositioned, Monitored  during session (pt declined premedication)    Home Living                          Prior Function            PT Goals (current goals can now be found in the care plan section) Progress towards PT goals: Progressing toward goals    Frequency    Min 1X/week      PT Plan      Co-evaluation              AM-PAC PT "6 Clicks" Mobility   Outcome Measure  Help needed turning from your back to your side while in a flat bed without using bedrails?: A Little Help needed moving from lying on your back to sitting on the side of a flat bed without using bedrails?: A Little Help needed moving to and from a bed to a chair (including a wheelchair)?: A Lot Help needed standing up from a chair using your arms (e.g., wheelchair or bedside chair)?: A Lot Help needed to walk in hospital room?: A Lot Help needed climbing 3-5 steps with a railing? : Total 6 Click Score: 13    End of Session Equipment Utilized During Treatment: Gait belt Activity Tolerance: Patient tolerated treatment well Patient left: with call bell/phone within reach (on [name], RN aware, call bell and phone within reach) Nurse Communication: Mobility status PT Visit Diagnosis: Difficulty in walking, not elsewhere classified (R26.2);Muscle weakness (generalized) (M62.81)     Time: 1610-9604 PT Time Calculation (min) (ACUTE ONLY): 27 min  Charges:    $Therapeutic Activity: 23-37 mins PT General Charges $$ ACUTE PT VISIT: 1 Visit                     Thomasene Mohair PT, DPT Physical Therapist Acute Rehabilitation Services Office: [phone]   Kati L Payson 10/04/2023, 1:02 PM

## 2023-10-04 NOTE — Progress Notes (Signed)
    Patient Name: Elizabeth Odom           DOB: 1941-09-25  MRN: [id number]      Admission Date: 09/27/2023  Attending Provider: Lanae Boast, MD  Primary Diagnosis: Acute colitis   Level of care: Telemetry    CROSS COVER NOTE   Date of Service   10/04/2023   Elizabeth Odom, 82 y.o. female, was admitted on 09/27/2023 for Acute colitis.    HPI/Events of Note   Anemia normocytic Hemoglobin 7 -->  6.8.  No acute changes reported.  Hemodynamically stable. No melena, hematochezia, or other bleeding reported tonight. Currently on Heparin gtt.    Interventions/ Plan   Blood transfusion, 1 unit PRBC Recheck H&H after transfusion is completed, transfuse if HGB <7.         Anthoney Harada, DNP, ACNPC- AG [facility] [place]

## 2023-10-05 ENCOUNTER — Inpatient Hospital Stay (HOSPITAL_COMMUNITY): Payer: PPO

## 2023-10-05 DIAGNOSIS — I82402 Acute embolism and thrombosis of unspecified deep veins of left lower extremity: Secondary | ICD-10-CM | POA: Insufficient documentation

## 2023-10-05 DIAGNOSIS — Z515 Encounter for palliative care: Secondary | ICD-10-CM

## 2023-10-05 DIAGNOSIS — R4589 Other symptoms and signs involving emotional state: Secondary | ICD-10-CM

## 2023-10-05 DIAGNOSIS — D62 Acute posthemorrhagic anemia: Secondary | ICD-10-CM | POA: Insufficient documentation

## 2023-10-05 DIAGNOSIS — I2693 Single subsegmental pulmonary embolism without acute cor pulmonale: Secondary | ICD-10-CM

## 2023-10-05 DIAGNOSIS — K56609 Unspecified intestinal obstruction, unspecified as to partial versus complete obstruction: Secondary | ICD-10-CM | POA: Diagnosis not present

## 2023-10-05 DIAGNOSIS — K661 Hemoperitoneum: Secondary | ICD-10-CM | POA: Diagnosis not present

## 2023-10-05 DIAGNOSIS — Z7189 Other specified counseling: Secondary | ICD-10-CM | POA: Diagnosis not present

## 2023-10-05 DIAGNOSIS — K529 Noninfective gastroenteritis and colitis, unspecified: Secondary | ICD-10-CM | POA: Diagnosis not present

## 2023-10-05 HISTORY — PX: IR IVC FILTER PLMT / S&I /IMG GUID/MOD SED: IMG701

## 2023-10-05 LAB — GLUCOSE, CAPILLARY
Glucose-Capillary: 105 mg/dL — ABNORMAL HIGH (ref 70–99)
Glucose-Capillary: 119 mg/dL — ABNORMAL HIGH (ref 70–99)
Glucose-Capillary: 132 mg/dL — ABNORMAL HIGH (ref 70–99)
Glucose-Capillary: 138 mg/dL — ABNORMAL HIGH (ref 70–99)
Glucose-Capillary: 145 mg/dL — ABNORMAL HIGH (ref 70–99)
Glucose-Capillary: 98 mg/dL (ref 70–99)

## 2023-10-05 LAB — BASIC METABOLIC PANEL
Anion gap: 6 (ref 5–15)
BUN: 30 mg/dL — ABNORMAL HIGH (ref 8–23)
CO2: 26 mmol/L (ref 22–32)
Calcium: 8.1 mg/dL — ABNORMAL LOW (ref 8.9–10.3)
Chloride: 108 mmol/L (ref 98–111)
Creatinine, Ser: 0.75 mg/dL (ref 0.44–1.00)
GFR, Estimated: >60 mL/min (ref >60)
Glucose, Bld: 143 mg/dL — ABNORMAL HIGH (ref 70–99)
Potassium: 3.3 mmol/L — ABNORMAL LOW (ref 3.5–5.1)
Sodium: 140 mmol/L (ref 135–145)

## 2023-10-05 LAB — CBC
HCT: 33.2 % — ABNORMAL LOW (ref 36.0–46.0)
Hemoglobin: 11.1 g/dL — ABNORMAL LOW (ref 12.0–15.0)
MCH: 31.2 pg (ref 26.0–34.0)
MCHC: 33.4 g/dL (ref 30.0–36.0)
MCV: 93.3 fL (ref 80.0–100.0)
Platelets: 227 10*3/uL (ref 150–400)
RBC: 3.56 MIL/uL — ABNORMAL LOW (ref 3.87–5.11)
RDW: 15.5 % (ref 11.5–15.5)
WBC: 19.2 10*3/uL — ABNORMAL HIGH (ref 4.0–10.5)
nRBC: 0.2 % (ref 0.0–0.2)

## 2023-10-05 LAB — PHOSPHORUS: Phosphorus: 2.5 mg/dL (ref 2.5–4.6)

## 2023-10-05 LAB — HEMOGLOBIN AND HEMATOCRIT, BLOOD
HCT: 24 % — ABNORMAL LOW (ref 36.0–46.0)
Hemoglobin: 7.8 g/dL — ABNORMAL LOW (ref 12.0–15.0)

## 2023-10-05 LAB — APTT: aPTT: 30 s (ref 24–36)

## 2023-10-05 LAB — HAPTOGLOBIN: Haptoglobin: 223 mg/dL (ref 41–333)

## 2023-10-05 LAB — PROTIME-INR
INR: 1.1 (ref 0.8–1.2)
Prothrombin Time: 14.3 s (ref 11.4–15.2)

## 2023-10-05 LAB — FIBRINOGEN: Fibrinogen: 636 mg/dL — ABNORMAL HIGH (ref 210–475)

## 2023-10-05 LAB — MAGNESIUM: Magnesium: 2.2 mg/dL (ref 1.7–2.4)

## 2023-10-05 LAB — PREPARE RBC (CROSSMATCH)

## 2023-10-05 MED ORDER — IOHEXOL 300 MG/ML  SOLN
50.0000 mL | Freq: Once | INTRAMUSCULAR | Status: AC | PRN
  Administered 2023-10-05: 10 mL via INTRAVENOUS

## 2023-10-05 MED ORDER — SODIUM CHLORIDE 0.9% IV SOLUTION: Freq: Once | INTRAVENOUS | Status: AC

## 2023-10-05 MED ORDER — DEXTROSE-SODIUM CHLORIDE 5-0.9 % IV SOLN: INTRAVENOUS | Status: AC

## 2023-10-05 MED ORDER — POTASSIUM CHLORIDE 10 MEQ/100ML IV SOLN
10.0000 meq | INTRAVENOUS | Status: AC
Start: 2023-10-05 — End: 2023-10-05
  Administered 2023-10-05 (×2): 10 meq via INTRAVENOUS
  Filled 2023-10-05 (×2): qty 100

## 2023-10-05 MED ORDER — MIDAZOLAM HCL 2 MG/2ML IJ SOLN
INTRAMUSCULAR | Status: AC
  Filled 2023-10-05: qty 2

## 2023-10-05 MED ORDER — IOHEXOL 300 MG/ML  SOLN
100.0000 mL | Freq: Once | INTRAMUSCULAR | Status: AC | PRN
  Administered 2023-10-05: 30 mL via INTRAVENOUS

## 2023-10-05 MED ORDER — PIPERACILLIN-TAZOBACTAM 3.375 G IVPB
3.3750 g | Freq: Three times a day (TID) | INTRAVENOUS | Status: AC
  Administered 2023-10-05 – 2023-10-09 (×13): 3.375 g via INTRAVENOUS
  Filled 2023-10-05 (×13): qty 50

## 2023-10-05 MED ORDER — LIDOCAINE HCL 1 % IJ SOLN
INTRAMUSCULAR | Status: AC
  Filled 2023-10-05: qty 20

## 2023-10-05 MED ORDER — FENTANYL CITRATE (PF) 100 MCG/2ML IJ SOLN
INTRAMUSCULAR | Status: AC
  Filled 2023-10-05: qty 2

## 2023-10-05 MED ORDER — LIDOCAINE HCL 1 % IJ SOLN
20.0000 mL | Freq: Once | INTRAMUSCULAR | Status: AC
  Administered 2023-10-05: 10 mL via INTRADERMAL

## 2023-10-05 MED ORDER — ZINC OXIDE 40 % EX OINT
TOPICAL_OINTMENT | Freq: Two times a day (BID) | CUTANEOUS | Status: DC | PRN
  Administered 2023-10-05 – 2023-10-26 (×3): 1 via TOPICAL
  Filled 2023-10-05 (×2): qty 57

## 2023-10-05 NOTE — Progress Notes (Signed)
PCCM Attending:  While reviewing labs from earlier this evening posttransfusion hemoglobin of 7.0 was noted.  I personally contacted the ICU nurse to inform me that patient did receive protamine as well as the previously ordered 1 unit PRBC but has not received any further transfusion.  Ordering stat coags.  Ordering stat transfusion of 2 units PRBCs with follow-up CBC upon completion of transfusion.  Patient's bedside nurse made aware of the plan of care.  Donna Christen Jamison Neighbor, M.D. [facility] 1:14 AM 10/05/23   Please see Amion for pager details.  From 7A-7P if no response please call [phone] After hours, please call ELink [phone]

## 2023-10-05 NOTE — Progress Notes (Addendum)
NAME:  Elizabeth Odom, MRN:  [id number], DOB:  1941/02/04, LOS: 8 ADMISSION DATE:  09/27/2023, CONSULTATION DATE:  10/24 REFERRING MD:  Dr. Jonathon Bellows, CHIEF COMPLAINT:  Anemia   History of Present Illness:  82 year old female with past medical history as below, which is significant for cervical cancer, hypertension, and atrial fibrillation not on anticoagulation.  She she initially presented to the emergency department on 10/10 with complaints of abdominal pain.  Workup was consistent with constipation and enteritis/colitis.  She was discharged with prescription for Augmentin and MiraLAX.  Overall she felt better but abdominal pain persisted.  She again presented to the emergency department on 10/17 with similar complaints.    10/24 lab work revealed significant drop in H&H prompting transfusion and transfer to ICU. CT with pelvic hematoma. PCCM consulted   Pertinent  Medical History   has a past medical history of A-fib (HCC) (04/09/2016), Acute head injury (04/09/2016), Cervical cancer (HCC), Cervical spine fracture, initial encounter (04/09/2016), Essential hypertension, History of anemia, Multiple fractures of cervical spine (HCC) (04/09/2016), PAC (premature atrial contraction), Restless leg, and Syncope and collapse (04/09/2016).   Significant Hospital Events: Including procedures, antibiotic start and stop dates in addition to other pertinent events   10/17 admit for bowel obstruction, pancreatic cyst  CT showed diffuse fatty infiltration of the liver, hypodensity in the tail of pancreas, and large bowel obstruction in the rectosigmoid region.  10/18 MRCP which showed no main pancreatic duct dilation and cystic lesion of the pancreas concerning for pseudocyst versus neoplasm.  10/19 flex sig, unable to advance scope past obstruction, surgery consulted  10/20 CT of the chest with contrast for staging of potential colon cancer and was noted to have segmental filling defects consistent with PE with NO  evidence of right heart strain, Heparin drip started  10/21 to OR for hartmans procedure and small bowel resection, negative for malignancy  10/24 Hgb drop form 13.1 (preop) to 6.8 transfused and heparin drip held. Oncology consulted  Acute DVT to left left  popliteal vein, left posterior tibial veins, and left peroneal veins and findings consistent with acute intramuscular thrombosis involving the left  gastrocnemius veins.  CT with pelvic hematoma posterior to surgical site/anastomosis site. Non acute ABD  10/25 projectile vomited 3 times overnight and continues to refuse NG tube placement for decompression.  Disoriented to situation  Interim History / Subjective:  Seen sitting up in bed in moderate discomfort with complaints of nausea  Objective   Blood pressure (!) 167/139, pulse 77, temperature 98 F (36.7 C), resp. rate 20, height 5\' 6"  (1.676 m), weight 64.5 kg, SpO2 97%.        Intake/Output Summary (Last 24 hours) at 10/05/2023 5621 Last data filed at 10/05/2023 3086 Gross per 24 hour  Intake 1722.98 ml  Output 807 ml  Net 915.98 ml   Filed Weights   10/02/23 0500 10/03/23 0500 10/04/23 0500  Weight: 61.1 kg 64.5 kg 64.5 kg    Examination: General: Acute on chronic ill-appearing elderly female lying in bed in no acute distress HEENT: [place]/AT, MM pink/moist, PERRL,  Neuro: Alert and oriented x 3, disoriented to situation, nonfocal CV: s1s2 regular rate and rhythm, no murmur, rubs, or gallops,  PULM: Diminished bilaterally, no increased work of breathing, no added breath sounds, on 2 L nasal cannula GI: soft, bowel sounds active in all 4 quadrants, non-tender, non-distended Extremities: warm/dry, no edema  Skin: no rashes or lesions  Resolved Hospital Problem list   Small Bowel Obstruction  Assessment & Plan:   Acute blood loss anemia secondary to hemoperitoneum in the post-surgical setting and on anticoagulation.  -Occurred following bowel resection on systemic  anticoagulation with heparin drip -S/p protamine for heparin reversal P: Trend CBC Transfuse per protocol Hemoglobin goal greater than 7 Anticoagulation remains on hold General Surgery following  Segmental pulmonary embolism without CT evidence of right heart strain -Echo 10/21 with normal RV function Left lower extremity DVT -Acute DVT to left left  popliteal vein, left posterior tibial veins, and left peroneal veins and findings consistent with acute intramuscular thrombosis involving the left  gastrocnemius veins.  P: Heparin drip remains on hold as above Interventional radiology consulted for IVC filter  Large bowel obstruction now s/p bowel resection with postoperative intra-abdominal hemorrhage -Status post surgical resection and colostomy 10/21.   P: Surgery following, appreciate assistance NG tube currently out and patient refuses reinsertion Aspiration precautions Wet-to-dry dressing changes per surgery Pain control Continue empiric Zosyn  HFpEF -Echocardiogram with hyperdynamic function, EF 70 to 75%, mild left ventricular hypertrophy, and grade 1 diastolic dysfunction Atrial fibrillation Essential hypertension P: Continuous telemetry Anticoagulation on hold as above Daily weight Daily assessment for need to diurese GDMT as able As needed IV antihypertensives Resume home medication as able  Prediabetes -Hemoglobin A1c 5.8 P: SSI  CNG goal 140-180 CBG checks Q4   Hypokalemia P: Trend BMP Supplement as needed  Goals of care Patient alert and oriented x 3 but disoriented to situation and does not seem capable of understanding full extent of critical illness.  She continues to refuse NG tube placement for decompression but projectile vomited 3 times overnight. We will continue to discuss goals of care in collaboration with primary team and surgical team to develop plan of care.  Best Practice (right click and "Reselect all SmartList Selections" daily)    Diet/type: NPO w/ oral meds DVT prophylaxis: not indicated GI prophylaxis: N/A Lines: N/A Foley:  N/A Code Status:  DNR Last date of multidisciplinary goals of care discussion Per primary   Signature   Dai Apel D. Harris, NP-[facility] Personal contact information can be found on Amion  If no contact or response made please call [phone] 10/05/2023, 8:18 AM

## 2023-10-05 NOTE — Progress Notes (Signed)
PROGRESS NOTE Elizabeth Odom  NFA:[id number] DOB: September 20, 1941 DOA: 09/27/2023 PCP: [name], FNP  Brief Narrative/Hospital Course: 82 year old female with history of A-fib, RLS, fall risk, anemia who was seen in the ED with last week on 09/20/23 for abdominal pain Suprapubic, decreased appetite and also fever and in ED had CT done that showed colitis and has been on Augmentin until 2 days PTA and was also told to increase her MiraLAX, the nshe had mild symptomatic improvement on 10/16 but had had severe recurrence of her pain and intolerance to oral intake and 3 episodes of nonbilious and nonbloody diarrhea so return to the ED on 09/27/2023. Patient reported diarrhea since he had been on antibiotics initially suprapubic pain now all over unable to keep anything down due to nausea vomiting.  Last colonoscopy probably 10 years ago. In the ED: Vital stable labs showed leukocytosis 15.5. CT ABD W/ contrast>>Wall thickening of the proximal sigmoid colon worrisome for neoplasm. Focal colitis not excluded. There is upstream colonic dilatation with air-fluid levels and large amount of stool compatible with obstruction. Small amount of free fluid in the pelvis. Fatty infiltration of the liver.  9 cm cystic lesion in the tail of the pancreas.  Subcentimeter lucent lesion in the L1 vertebral body indeterminate. Seen by GI- refused colonoscopy agreed for sigmoidoscopy> done 09/29/23>shows very tight structure versus mass in the rectosigmoid could not pass ultrathin colonoscope or even endoscope,surgery consulted.CCS discussed over of uncertain situation tumor like a mass versus possible colitis versus possible ischemic colitis and after extensive discussion with patient and patient' underwent ex laparotomy with Hartman's resection and colostomy and small bowel resection 10/21. Patient was noticed to have PE acute on CT chest and subsequently started on heparin-echo unremarkable and hemodynamically  stable 10/24>Hemoglobin downtrending> dropped to  6.8 gm. Leg duplex + for dvt acute on left. 10/24>CT hematuria study: Postsurgical changes, few scattered areas of free air as per surgery suture line of the small bowel in the pelvis is a dilated loop of bowel with mixed density including some indeterminate areas in the lumen ,proximal to the nondilated 7.1 cm fluid in the duodenum, significant adjacent presumed hematoma in the pelvis posterior to the suture line and on the coronal images of the suture line somewhat indistinct, diffuse colonic diverticula pleural effusion right greater than left, nonobstructive left-sided renal stone and renal cyst small thickening of urinary bladder known pulmonary emboli. Emergently evaluated by surgery at bedside, abdomen soft no peritoneal sign, additional PRBC transfused, ICU consulted and transferred to stepdown for close monitoring 10/24: Per discussion with PCCM patient wanted to be DNR, s/p heparin reversal with protamine  Subjective: Patient seen and examined this morning Having projectile vomiting x 3 overnight-explained to the patient again. Patient continues to refuse NG tube placement -Hemodynamically stable blood pressure mostly in 120s to 160s, on 2 L nasal cannula afebrile Hemoglobin last  one 7.8 g, got PRBC- total 4 renal function is stable   Assessment and Plan: Principal Problem:   Large bowel obstruction (HCC) Active Problems:   Acute colitis   Left leg DVT (HCC)   ABLA (acute blood loss anemia)   Large bowel obstruction s/p ex lap Hartman's resectio, SB resection and colostomy 10/01/23 Vomiting/Dilated small bowel loops concerning for pSBO: Sigmoidoscopy 09/29/23>very tight structure versus mass in the rectosigmoid could not pass ultrathin colonoscope or even endoscope,surgery consulted and underwent ex laparotomy with Hartman's resection and colostomy and small bowel resection 10/21.NGT removed by patient 10/23 night. Biopsies showed  colon with diverticulosis with previous rupture with adhesion/stricturing and evidence of lymphocytic response and focal acute inflammation consistent with resolving abscess, negative for malignancy. Patient will NG tube insertion discussed with patient and surgery team, granddaughter and on the way to discuss with patient.  Keep on IV fluid hydration Postop course complicated with ABLA and pelvic hematoma and hematoma around SB anastomosis. Continue with hydration w/ ivf.  Abdomen soft but distended  Leukocytosis: Initially on Zosyn, resumed 10/24 cont x 5 days Recent Labs  Lab 09/29/23 0647 10/01/23 0326 10/02/23 0408 10/03/23 0345 10/04/23 0359  WBC 12.0* 11.3* 13.7* 16.8* 19.4*    Anemia normocytic acute on chronic ABLA Pelvic hematoma and hematoma around SB anastomosis: Initial hb uplikely hemoconcentration> Hb drifting down to 6.8.  Underwent CT hematuria study-intra-abdominal hematoma-likely multifactorial in the setting of postop status, heparin use. Heparin drip discontinued and reversed 10/24 and transferred to stepdown  s/p 4 u PRBCs. Aptt normal at 30, INR  normal 1.1. Repeat H&H.Monitor hemodynamics in the stepdown Keep hb close to 9 g Recent Labs  Lab 10/04/23 0359 10/04/23 1239 10/04/23 1608 10/04/23 2021 10/05/23 0247  HGB 6.8* 7.4* 7.5* 7.0* 7.8*  HCT 21.2* 23.2* 23.4* 21.4* 24.0*   Hematuria: Recheck UA, neurology input appreciated CT hematuria showed no acute urinary process.  Will eventually need outpatient cystoscopy. Monitor  9 mm Pancreatic cyst: S/p MRI>showed pancreatic cyst in the pancreatic body/tail junction with differential considerations of pseudocyst or indolent cystic neoplasm. Per consensus criteria, this warrants follow-up with pre and post contrast abdominal MRI at 1 year.  Nodule in the inferior aspect of the isthmus will need thyroid ultrasound not emergently.  Hyperglycemia Prediabetes with A1c 5.8: Blood sugar is well-controlled monitor  Accu-Chek    Hyponatremia: Hypokalemia: Replace potassium.    Small to moderate-sized Acute PE w/o right heart strain Acute left leg DVT: Per urology review on CT chest from 10/20 found to have-nonocclusive filling defect in segmental pulmonary artery, consistent with acute small to moderate volume PE ( see report).  Patient was heparinized without bolus post op with discussion with surgery team. TTE w/ stable EF no right heart dysfunction.left leg shows Acute DVT. At this time unable to anticoagulate heparin has been discontinued and reversed due to ABLA. IR consulted for IVC filter after discussion with the hematology oncology team  HFpEF: EF 77 0% G1 DD.  Monitor volume status.  At this time needing resuscitation  Paroxysmal  atrial fibrillation: Monitor in telemetry. Unable to anticoagulate at this time. Pta not on anticoagulation.  Goals of care: ICU team discussed with the patient and the family and patient was changed to DNR 10/24.  Patient has been refusing NG tube insertion, she remains high risk for decompensation, palliative care consultation requested. Given urgent/emergent nature of Gertie Gowda procedure she had and her advanced age and multiple comorbidities, patient is a high risk.  Continue to monitor closely in stepdown unit.   DVT prophylaxis: none due to hematoma and dvt Code Status:   Code Status: Limited: Do not attempt resuscitation (DNR) -DNR-LIMITED -Do Not Intubate/DNI  Family Communication: plan of care discussed with patient/ no family at bedside  Patient status NG:[id number] because of large bowel obstruction Level of care: Stepdown  Dispo: The patient is from: Home, independent            Anticipated disposition:  TBD  Objective: Vitals last 24 hrs: Vitals:   10/05/23 0630 10/05/23 0645 10/05/23 0900 10/05/23 0905  BP: (!) 167/139 (!) 120/56 Marland Kitchen)  142/50   Pulse: 77 72 67   Resp: 20 17 17    Temp:    98.4 F (36.9 C)  TempSrc:    Oral  SpO2: 97% 97% 97%    Weight:      Height:       Weight change: -2.5 kg  Physical Examination: General exam: alert awake, oriented to place people HEENT:Oral mucosa moist, Ear/Nose WNL grossly Respiratory system: Bilaterally diminished breath sound at the base  Cardiovascular system: S1 & S2 +, No JVD. Gastrointestinal system: Abdomen soft, distended incision c/d/I with dressing, left colostomy in place , BS hypoactive. Nervous System: Alert, awake, moving all extremities,and following commands. Extremities: LE edema neg,distal peripheral pulses palpable and warm.  Skin: No rashes,no icterus. MSK: Normal muscle bulk,tone, power   Medications reviewed:  Scheduled Meds:  bisacodyl  10 mg Rectal Once   Chlorhexidine Gluconate Cloth  6 each Topical Q0600   insulin aspart  0-9 Units Subcutaneous Q4H   mupirocin ointment  1 Application Nasal BID  Continuous Infusions:  dextrose 5 % and 0.9 % NaCl 50 mL/hr at 10/05/23 1014   piperacillin-tazobactam (ZOSYN)  IV      Diet Order             Diet NPO time specified Except for: Ice Chips  Diet effective now                  Intake/Output Summary (Last 24 hours) at 10/05/2023 1015 Last data filed at 10/05/2023 1014 Gross per 24 hour  Intake 1762.84 ml  Output 1388 ml  Net 374.84 ml   Net IO Since Admission: 3,219.58 mL [10/05/23 1015]  Wt Readings from Last 3 Encounters:  10/05/23 62 kg  09/20/23 56.2 kg  11/23/20 55.3 kg     Unresulted Labs (From admission, onward)     Start     Ordered   10/05/23 0810  CBC  ONCE - STAT,   STAT       Question:  Specimen collection method  Answer:  Lab=Lab collect   10/05/23 0809   10/04/23 0500  Basic metabolic panel  Daily,   R      10/03/23 1133   10/02/23 0500  CBC  Daily,   R      10/01/23 1328          Data Reviewed: I have personally reviewed following labs and imaging studies CBC: Recent Labs  Lab 09/29/23 0647 10/01/23 0326 10/02/23 0408 10/03/23 0345 10/03/23 2124 10/04/23 0359  10/04/23 1239 10/04/23 1608 10/04/23 2021 10/05/23 0247  WBC 12.0* 11.3* 13.7* 16.8*  --  19.4*  --   --   --   --   HGB 13.0 13.1 10.2* 8.6*   < > 6.8* 7.4* 7.5* 7.0* 7.8*  HCT 39.5 40.3 31.4* 25.8*   < > 21.2* 23.2* 23.4* 21.4* 24.0*  MCV 93.6 94.8 94.6 94.5  --  96.4  --   --   --   --   PLT 512* 409* 314 325  --  263  --   --   --   --    < > = values in this interval not displayed.   Basic Metabolic Panel: Recent Labs  Lab 10/02/23 0408 10/02/23 1944 10/03/23 0345 10/04/23 0359 10/05/23 0247  NA 137 135 137 140 140  K 3.8 4.0 4.3 4.0 3.3*  CL 104 103 105 107 108  CO2 27 26 24 26 26   GLUCOSE 249* 146* 176* 133*  143*  BUN 19 34* 30* 30* 30*  CREATININE 0.92 0.68 0.59 0.58 0.75  CALCIUM 8.0* 7.8* 8.1* 8.5* 8.1*  MG  --   --  1.8  --  2.2  PHOS  --   --  2.0*  --  2.5   GFR: Estimated Creatinine Clearance: 48.8 mL/min (by C-G formula based on SCr of 0.75 mg/dL). No results for input(s): "HGBA1C" in the last 72 hours. CBG: Recent Labs  Lab 10/04/23 1548 10/04/23 2019 10/04/23 2344 10/05/23 0423 10/05/23 0822  GLUCAP 123* 120* 116* 138* 145*   Recent Results (from the past 240 hour(s))  Surgical pcr screen     Status: Abnormal   Collection Time: 10/01/23  4:19 AM   Specimen: Nasal Mucosa; Nasal Swab  Result Value Ref Range Status   MRSA, PCR POSITIVE (A) NEGATIVE Final    Comment: RESULT CALLED TO, READ BACK BY AND VERIFIED WITH: NGUYEN, T. RN AT [phone] ON 10/01/2023 BY MECIAL J.    Staphylococcus aureus POSITIVE (A) NEGATIVE Final    Comment: (NOTE) The Xpert SA Assay (FDA approved for NASAL specimens in patients 53 years of age and older), is one component of a comprehensive surveillance program. It is not intended to diagnose infection nor to guide or monitor treatment. Performed at [facility], [address], [place], [place] [address]     Antimicrobials: Anti-infectives (From admission, onward)    Start     Dose/Rate Route  Frequency Ordered Stop   10/05/23 1200  piperacillin-tazobactam (ZOSYN) IVPB 3.375 g        3.375 g 12.5 mL/hr over 240 Minutes Intravenous Every 8 hours 10/05/23 0721 10/09/23 1959   10/04/23 2000  piperacillin-tazobactam (ZOSYN) IVPB 3.375 g  Status:  Discontinued        3.375 g 12.5 mL/hr over 240 Minutes Intravenous Every 8 hours 10/04/23 1827 10/05/23 0721   09/27/23 2315  piperacillin-tazobactam (ZOSYN) IVPB 3.375 g  Status:  Discontinued        3.375 g 12.5 mL/hr over 240 Minutes Intravenous Every 8 hours 09/27/23 2305 10/02/23 1106      Culture/Microbiology    Component Value Date/Time   SDES  09/20/2023 1930    BLOOD BLOOD RIGHT HAND Performed at [facility], [address] [place], [place] [address]    SPECREQUEST  09/20/2023 1930    BOTTLES DRAWN AEROBIC AND ANAEROBIC Blood Culture results may not be optimal due to an inadequate volume of blood received in culture bottles Performed at [facility], [address] [place], [place] [address]    CULT  09/20/2023 1930    NO GROWTH 5 DAYS Performed at [facility], [address], [place], [place] [address]    REPTSTATUS 09/25/2023 FINAL 09/20/2023 1930  Radiology Studies: CT HEMATURIA WORKUP  Result Date: 10/04/2023 CLINICAL DATA:  Hematuria. EXAM: CT ABDOMEN AND PELVIS WITHOUT AND WITH CONTRAST TECHNIQUE: Multidetector CT imaging of the abdomen and pelvis was performed following the standard protocol before and following the bolus administration of intravenous contrast. RADIATION DOSE REDUCTION: This exam was performed according to the departmental dose-optimization program which includes automated exposure control, adjustment of the mA and/or kV according to patient size and/or use of iterative reconstruction technique. CONTRAST:  OMNIPAQUE IOHEXOL 300 MG/ML  SOLN COMPARISON:  Preop CT scan 09/27/2023 and older FINDINGS: Lower chest: Breathing motion at the bases. Tiny  right effusion and trace left. Mild basilar atelectasis. Known pulmonary emboli again identified.  Hepatobiliary: No focal liver abnormality is seen. No gallstones, gallbladder wall thickening, or biliary dilatation. Patent portal vein. Pancreas: Unremarkable. No pancreatic ductal dilatation or surrounding inflammatory changes. Spleen: Normal in size without focal abnormality. Adrenals/Urinary Tract: Adrenal glands are preserved. There is a exophytic cyst from the lower pole of the right kidney with a thin septation with calcification measuring 6.7 x 6.5 cm in the axial plane. Hounsfield units of 7. No aggressive features. Bosniak 2 lesion. No specific imaging follow-up. No enhancing renal mass. There is a punctate nonobstructing stone along the lower pole of the left kidney. Small punctate hyperdense focus measuring 3 mm along the upper pole left kidney on series 5, image 59 of the precontrast coronal. Slightly brush like appearance to the calices consistent with benign tubular ectasia. The collecting systems are nondilated. The ureters have normal course and caliber down to the bladder. No urothelial thickening, filling defect or dilatation. Grossly preserved contours of the urinary bladder. There is a small bubble of air in the lumen of the bladder. Please correlate for etiology including any recent instrumentation. Slight areas of wall thickening of the bladder but nonspecific with the adjacent process. Stomach/Bowel: Stomach is fluid-filled. The duodenal is dilated with air-fluid levels measuring up to 3.8 cm. There is also a fluid-filled dilated jejunum in the left midabdomen and pelvis. These extend down to a small bowel suture line in the central pelvis above the bladder as seen on series 7, image 65. No oral contrast. In this location is some air with mixed intermediate density material along the bowel. This could be luminal debris although based on appearance this could be also hematoma. This loop is  dilated up to 7.1 cm focally in the axial plane. Also the suture line is somewhat discontiguous in some locations as seen on coronal series 9, image 31. There is also a significant complex fluid collection posterior to this area in the pelvis anterior to the rectum above the bladder on series 7, image 72 measuring 8.9 cm. Again this could be hematoma. 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 Small-bowel resection and anastomosis. The distal ileum is more normal caliber, relatively decompressed The large bowel demonstrates some scattered stool with diverticula. Once again there has been distal colonic resection with a left lower quadrant colostomy. The loop extending to the colostomy is slightly with wall thickening. There is rectosigmoid stump with some luminal debris. Vascular/Lymphatic: Aortic atherosclerosis. No enlarged abdominal or pelvic lymph nodes. Reproductive: Status post hysterectomy. No adnexal masses. Other: There is open anterior abdominal wall pelvic wound in the midline. Anasarca. Mesenteric stranding. Smaller areas of trace fluid throughout the mesentery. Presacral and perirectal fat edema. Few bubbles of free air which could be postoperative. Musculoskeletal: Degenerative changes along the spine. IMPRESSION: Surgical changes identified since the prior examination consistent with the provided history of partial left colectomy, colostomy with a Hartmann's pouch procedure and associated small bowel resection with anastomosis. Few scattered areas of free air are as per surgery. The suture line of the small bowel in the pelvis is a dilated loop of bowel with mixed density including some intermediate areas in the lumen. This bowel loop is dilated to 7.1 cm in the proximal jejunum and duodenal is dilated with fluid. This could be a focal developing obstruction. The more distal small bowel is nondilated. In addition there is significant adjacent presumed  hematoma in the pelvis posterior to the suture line and on the coronal images the suture line  is somewhat indistinct along its course. With level of dilatation, the surrounding fluid, gas and hematoma, please correlate for any clinical evidence of a leak or dehiscence versus expected postop change. Slight wall thickening along the large bowel loop extending out to the colostomy along the left side. Diffuse colonic diverticula. Tiny pleural effusions, right-greater-than-left. Punctate nonobstructing lower pole left-sided renal stone. Bosniak 2 right-sided renal cystic lesion. No enhancing renal mass or separate collecting system dilatation. Slight wall thickening of the urinary bladder but this could relate to the adjacent process. Small bubble of air in the bladder lumen could be related to previous instrumentation but please correlate with history. Known pulmonary emboli. These are somewhat poorly seen on this abdomen and pelvis hematuria workup exam Critical Value/emergent results were called by telephone at the time of interpretation on 10/04/2023 at 3:04 pm to provider [facility] , who verbally acknowledged these results and will follow up with the surgery team. Electronically Signed   By: Karen Kays M.D.   On: 10/04/2023 18:09   VAS [place] LOWER EXTREMITY VENOUS (DVT)  Result Date: 10/04/2023  Lower Venous DVT Study Patient Name:  KURSTIN KOTLARZ  Date of Exam:   10/04/2023 Medical Rec #: [id number]       Accession #:    [id number] Date of Birth: August 09, 1941      Patient Gender: F Patient Age:   45 years Exam Location:  [facility] Procedure:      VAS [place] LOWER EXTREMITY VENOUS (DVT) Referring Phys: Lesleyanne Politte --------------------------------------------------------------------------------  Indications: Pulmonary embolism.  Risk Factors: Confirmed PE. Anticoagulation: Heparin. Comparison Study: No prior studies. Performing Technologist: Chanda Busing RVT  Examination Guidelines: A complete evaluation  includes B-mode imaging, spectral Doppler, color Doppler, and power Doppler as needed of all accessible portions of each vessel. Bilateral testing is considered an integral part of a complete examination. Limited examinations for reoccurring indications may be performed as noted. The reflux portion of the exam is performed with the patient in reverse Trendelenburg.  +---------+---------------+---------+-----------+----------+--------------+ RIGHT    CompressibilityPhasicitySpontaneityPropertiesThrombus Aging +---------+---------------+---------+-----------+----------+--------------+ CFV      Full           Yes      Yes                                 +---------+---------------+---------+-----------+----------+--------------+ SFJ      Full                                                        +---------+---------------+---------+-----------+----------+--------------+ FV Prox  Full                                                        +---------+---------------+---------+-----------+----------+--------------+ FV Mid   Full                                                        +---------+---------------+---------+-----------+----------+--------------+ FV DistalFull                                                        +---------+---------------+---------+-----------+----------+--------------+  PFV      Full                                                        +---------+---------------+---------+-----------+----------+--------------+ POP      Full           Yes      Yes                                 +---------+---------------+---------+-----------+----------+--------------+ PTV      Full                                                        +---------+---------------+---------+-----------+----------+--------------+ PERO     Full                                                         +---------+---------------+---------+-----------+----------+--------------+   +---------+---------------+---------+-----------+----------+--------------+ LEFT     CompressibilityPhasicitySpontaneityPropertiesThrombus Aging +---------+---------------+---------+-----------+----------+--------------+ CFV      Full           Yes      Yes                                 +---------+---------------+---------+-----------+----------+--------------+ SFJ      Full                                                        +---------+---------------+---------+-----------+----------+--------------+ FV Prox  Full                                                        +---------+---------------+---------+-----------+----------+--------------+ FV Mid   Full                                                        +---------+---------------+---------+-----------+----------+--------------+ FV DistalFull                                                        +---------+---------------+---------+-----------+----------+--------------+ PFV      Full                                                        +---------+---------------+---------+-----------+----------+--------------+  POP      Partial        Yes      Yes                  Acute          +---------+---------------+---------+-----------+----------+--------------+ PTV      Partial                                      Acute          +---------+---------------+---------+-----------+----------+--------------+ PERO     Partial                                      Acute          +---------+---------------+---------+-----------+----------+--------------+ Gastroc  None                                         Acute          +---------+---------------+---------+-----------+----------+--------------+     Summary: RIGHT: - There is no evidence of deep vein thrombosis in the lower extremity.  - No cystic structure found in  the popliteal fossa.  LEFT: - Findings consistent with acute deep vein thrombosis involving the left popliteal vein, left posterior tibial veins, and left peroneal veins. Findings consistent with acute intramuscular thrombosis involving the left gastrocnemius veins. - No cystic structure found in the popliteal fossa.  *See table(s) above for measurements and observations. Electronically signed by Heath Lark on 10/04/2023 at 5:38:11 PM.    Final    DG Abd Portable 1V  Result Date: 10/04/2023 CLINICAL DATA:  Nausea and vomiting. EXAM: PORTABLE ABDOMEN - 1 VIEW COMPARISON:  09/27/2019 for FINDINGS: The bowel gas pattern has improved from the previous exam. There is been interval decrease in previously noted colonic distension. Within the left lower quadrant of the abdomen there are several dilated loops of small bowel which measure up to 3 mm, similar to the previous CT. IMPRESSION: 1. Interval improvement in diffuse colonic distension. 2. Persistent mild dilatation of small bowel loops in the left lower quadrant of the abdomen. Electronically Signed   By: Signa Kell M.D.   On: 10/04/2023 11:00     LOS: 8 days   Lanae Boast, MD [facility]  10/05/2023, 10:15 AM

## 2023-10-05 NOTE — Procedures (Signed)
  Procedure:  IVCgram and IVC filter placement infrarenal, retrievable Preprocedure diagnosis: The primary encounter diagnosis was Generalized abdominal pain. Diagnoses of Acute colitis and Iron deficiency anemia due to chronic blood loss were also pertinent to this visit. DVT, bleeding.  Postprocedure diagnosis: same EBL:    minimal Complications:   none immediate  See full dictation in [employer].  Thora Lance MD Main # [phone] Pager  [phone] Mobile [phone]

## 2023-10-05 NOTE — Plan of Care (Signed)
  Problem: Education: Goal: Ability to describe self-care measures that may prevent or decrease complications (Diabetes Survival Skills Education) will improve Outcome: Progressing   Problem: Coping: Goal: Ability to adjust to condition or change in health will improve Outcome: Progressing   Problem: Fluid Volume: Goal: Ability to maintain a balanced intake and output will improve Outcome: Progressing   Problem: Metabolic: Goal: Ability to maintain appropriate glucose levels will improve Outcome: Progressing   Problem: Nutritional: Goal: Maintenance of adequate nutrition will improve Outcome: Progressing   Problem: Skin Integrity: Goal: Risk for impaired skin integrity will decrease Outcome: Progressing   Problem: Clinical Measurements: Goal: Will remain free from infection Outcome: Progressing Goal: Cardiovascular complication will be avoided Outcome: Progressing   Problem: Coping: Goal: Level of anxiety will decrease Outcome: Progressing   Problem: Activity: Goal: Ability to return to baseline activity level will improve Outcome: Progressing

## 2023-10-05 NOTE — Progress Notes (Addendum)
4 Days Post-Op   Subjective/Chief Complaint: Events from overnight noted.  CT hematuria work up reviewed.  Patient was corrected with protamine overnight and heparin currently on hold.  She has received a total of 4 units of pRBCs since yesterday.  Started on zosyn overnight prophylactically as well.  Projectile vomited just recently this morning per nursing.   Objective: Vital signs in last 24 hours: Temp:  [97.5 F (36.4 C)-98.3 F (36.8 C)] 98 F (36.7 C) (10/25 0545) Pulse Rate:  [66-119] 72 (10/25 0645) Resp:  [9-29] 17 (10/25 0645) BP: (86-171)/(42-139) 120/56 (10/25 0645) SpO2:  [87 %-100 %] 97 % (10/25 0645) Weight:  [62 kg] 62 kg (10/24 2000) Last BM Date : 10/03/23  Intake/Output from previous day: 10/24 0701 - 10/25 0700 In: 1723 [I.V.:693.7; Blood:956; IV Piggyback:73.3] Out: 1007 [Urine:1007] Intake/Output this shift: No intake/output data recorded.  General appearance: NAD GI: inc c/d/I, and packed, ostomy patent with small amount of soft brown stool.  No blood present.   Psych: A&Ox3, but situationally confused.  Lab Results:  Recent Labs    10/02/23 0408 10/03/23 0345  WBC 13.7* 16.8*  HGB 10.2* 8.6*  HCT 31.4* 25.8*  PLT 314 325   BMET Recent Labs    10/04/23 0359 10/05/23 0247  NA 140 140  K 4.0 3.3*  CL 107 108  CO2 26 26  GLUCOSE 133* 143*  BUN 30* 30*  CREATININE 0.58 0.75  CALCIUM 8.5* 8.1*   PT/INR Recent Labs    10/05/23 0247  LABPROT 14.3  INR 1.1   ABG No results for input(s): "PHART", "HCO3" in the last 72 hours.  Invalid input(s): "PCO2", "PO2"  Studies/Results: CT HEMATURIA WORKUP  Result Date: 10/04/2023 CLINICAL DATA:  Hematuria. EXAM: CT ABDOMEN AND PELVIS WITHOUT AND WITH CONTRAST TECHNIQUE: Multidetector CT imaging of the abdomen and pelvis was performed following the standard protocol before and following the bolus administration of intravenous contrast. RADIATION DOSE REDUCTION: This exam was performed according  to the departmental dose-optimization program which includes automated exposure control, adjustment of the mA and/or kV according to patient size and/or use of iterative reconstruction technique. CONTRAST:  OMNIPAQUE IOHEXOL 300 MG/ML  SOLN COMPARISON:  Preop CT scan 09/27/2023 and older FINDINGS: Lower chest: Breathing motion at the bases. Tiny right effusion and trace left. Mild basilar atelectasis. Known pulmonary emboli again identified. Hepatobiliary: No focal liver abnormality is seen. No gallstones, gallbladder wall thickening, or biliary dilatation. Patent portal vein. Pancreas: Unremarkable. No pancreatic ductal dilatation or surrounding inflammatory changes. Spleen: Normal in size without focal abnormality. Adrenals/Urinary Tract: Adrenal glands are preserved. There is a exophytic cyst from the lower pole of the right kidney with a thin septation with calcification measuring 6.7 x 6.5 cm in the axial plane. Hounsfield units of 7. No aggressive features. Bosniak 2 lesion. No specific imaging follow-up. No enhancing renal mass. There is a punctate nonobstructing stone along the lower pole of the left kidney. Small punctate hyperdense focus measuring 3 mm along the upper pole left kidney on series 5, image 59 of the precontrast coronal. Slightly brush like appearance to the calices consistent with benign tubular ectasia. The collecting systems are nondilated. The ureters have normal course and caliber down to the bladder. No urothelial thickening, filling defect or dilatation. Grossly preserved contours of the urinary bladder. There is a small bubble of air in the lumen of the bladder. Please correlate for etiology including any recent instrumentation. Slight areas of wall thickening of the bladder  but nonspecific with the adjacent process. Stomach/Bowel: Stomach is fluid-filled. The duodenal is dilated with air-fluid levels measuring up to 3.8 cm. There is also a fluid-filled dilated jejunum in the  left midabdomen and pelvis. These extend down to a small bowel suture line in the central pelvis above the bladder as seen on series 7, image 65. No oral contrast. In this location is some air with mixed intermediate density material along the bowel. This could be luminal debris although based on appearance this could be also hematoma. This loop is dilated up to 7.1 cm focally in the axial plane. Also the suture line is somewhat discontiguous in some locations as seen on coronal series 9, image 31. There is also a significant complex fluid collection posterior to this area in the pelvis anterior to the rectum above the bladder on series 7, image 72 measuring 8.9 cm. Again this could be hematoma. 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 Small-bowel resection and anastomosis. The distal ileum is more normal caliber, relatively decompressed The large bowel demonstrates some scattered stool with diverticula. Once again there has been distal colonic resection with a left lower quadrant colostomy. The loop extending to the colostomy is slightly with wall thickening. There is rectosigmoid stump with some luminal debris. Vascular/Lymphatic: Aortic atherosclerosis. No enlarged abdominal or pelvic lymph nodes. Reproductive: Status post hysterectomy. No adnexal masses. Other: There is open anterior abdominal wall pelvic wound in the midline. Anasarca. Mesenteric stranding. Smaller areas of trace fluid throughout the mesentery. Presacral and perirectal fat edema. Few bubbles of free air which could be postoperative. Musculoskeletal: Degenerative changes along the spine. IMPRESSION: Surgical changes identified since the prior examination consistent with the provided history of partial left colectomy, colostomy with a Hartmann's pouch procedure and associated small bowel resection with anastomosis. Few scattered areas of free air are as per surgery. The suture line of the small bowel  in the pelvis is a dilated loop of bowel with mixed density including some intermediate areas in the lumen. This bowel loop is dilated to 7.1 cm in the proximal jejunum and duodenal is dilated with fluid. This could be a focal developing obstruction. The more distal small bowel is nondilated. In addition there is significant adjacent presumed hematoma in the pelvis posterior to the suture line and on the coronal images the suture line is somewhat indistinct along its course. With level of dilatation, the surrounding fluid, gas and hematoma, please correlate for any clinical evidence of a leak or dehiscence versus expected postop change. Slight wall thickening along the large bowel loop extending out to the colostomy along the left side. Diffuse colonic diverticula. Tiny pleural effusions, right-greater-than-left. Punctate nonobstructing lower pole left-sided renal stone. Bosniak 2 right-sided renal cystic lesion. No enhancing renal mass or separate collecting system dilatation. Slight wall thickening of the urinary bladder but this could relate to the adjacent process. Small bubble of air in the bladder lumen could be related to previous instrumentation but please correlate with history. Known pulmonary emboli. These are somewhat poorly seen on this abdomen and pelvis hematuria workup exam Critical Value/emergent results were called by telephone at the time of interpretation on 10/04/2023 at 3:04 pm to provider [facility] , who verbally acknowledged these results and will follow up with the surgery team. Electronically Signed   By: Karen Kays M.D.   On: 10/04/2023 18:09   VAS [place] LOWER EXTREMITY VENOUS (DVT)  Result Date: 10/04/2023  Lower Venous DVT Study Patient  Name:  SENIAH MOREE  Date of Exam:   10/04/2023 Medical Rec #: [id number]       Accession #:    [id number] Date of Birth: [birth year]      Patient Gender: F Patient Age:   17 years Exam Location:  [facility] Procedure:      VAS [place] LOWER  EXTREMITY VENOUS (DVT) Referring Phys: RAMESH KC --------------------------------------------------------------------------------  Indications: Pulmonary embolism.  Risk Factors: Confirmed PE. Anticoagulation: Heparin. Comparison Study: No prior studies. Performing Technologist: Chanda Busing RVT  Examination Guidelines: A complete evaluation includes B-mode imaging, spectral Doppler, color Doppler, and power Doppler as needed of all accessible portions of each vessel. Bilateral testing is considered an integral part of a complete examination. Limited examinations for reoccurring indications may be performed as noted. The reflux portion of the exam is performed with the patient in reverse Trendelenburg.  +---------+---------------+---------+-----------+----------+--------------+ RIGHT    CompressibilityPhasicitySpontaneityPropertiesThrombus Aging +---------+---------------+---------+-----------+----------+--------------+ CFV      Full           Yes      Yes                                 +---------+---------------+---------+-----------+----------+--------------+ SFJ      Full                                                        +---------+---------------+---------+-----------+----------+--------------+ FV Prox  Full                                                        +---------+---------------+---------+-----------+----------+--------------+ FV Mid   Full                                                        +---------+---------------+---------+-----------+----------+--------------+ FV DistalFull                                                        +---------+---------------+---------+-----------+----------+--------------+ PFV      Full                                                        +---------+---------------+---------+-----------+----------+--------------+ POP      Full           Yes      Yes                                  +---------+---------------+---------+-----------+----------+--------------+ PTV      Full                                                        +---------+---------------+---------+-----------+----------+--------------+  PERO     Full                                                        +---------+---------------+---------+-----------+----------+--------------+   +---------+---------------+---------+-----------+----------+--------------+ LEFT     CompressibilityPhasicitySpontaneityPropertiesThrombus Aging +---------+---------------+---------+-----------+----------+--------------+ CFV      Full           Yes      Yes                                 +---------+---------------+---------+-----------+----------+--------------+ SFJ      Full                                                        +---------+---------------+---------+-----------+----------+--------------+ FV Prox  Full                                                        +---------+---------------+---------+-----------+----------+--------------+ FV Mid   Full                                                        +---------+---------------+---------+-----------+----------+--------------+ FV DistalFull                                                        +---------+---------------+---------+-----------+----------+--------------+ PFV      Full                                                        +---------+---------------+---------+-----------+----------+--------------+ POP      Partial        Yes      Yes                  Acute          +---------+---------------+---------+-----------+----------+--------------+ PTV      Partial                                      Acute          +---------+---------------+---------+-----------+----------+--------------+ PERO     Partial                                      Acute           +---------+---------------+---------+-----------+----------+--------------+ Gastroc  None  Acute          +---------+---------------+---------+-----------+----------+--------------+     Summary: RIGHT: - There is no evidence of deep vein thrombosis in the lower extremity.  - No cystic structure found in the popliteal fossa.  LEFT: - Findings consistent with acute deep vein thrombosis involving the left popliteal vein, left posterior tibial veins, and left peroneal veins. Findings consistent with acute intramuscular thrombosis involving the left gastrocnemius veins. - No cystic structure found in the popliteal fossa.  *See table(s) above for measurements and observations. Electronically signed by Heath Lark on 10/04/2023 at 5:38:11 PM.    Final    DG Abd Portable 1V  Result Date: 10/04/2023 CLINICAL DATA:  Nausea and vomiting. EXAM: PORTABLE ABDOMEN - 1 VIEW COMPARISON:  09/27/2019 for FINDINGS: The bowel gas pattern has improved from the previous exam. There is been interval decrease in previously noted colonic distension. Within the left lower quadrant of the abdomen there are several dilated loops of small bowel which measure up to 3 mm, similar to the previous CT. IMPRESSION: 1. Interval improvement in diffuse colonic distension. 2. Persistent mild dilatation of small bowel loops in the left lower quadrant of the abdomen. Electronically Signed   By: Signa Kell M.D.   On: 10/04/2023 11:00    Anti-infectives: Anti-infectives (From admission, onward)    Start     Dose/Rate Route Frequency Ordered Stop   10/05/23 1200  piperacillin-tazobactam (ZOSYN) IVPB 3.375 g        3.375 g 12.5 mL/hr over 240 Minutes Intravenous Every 8 hours 10/05/23 0721 10/09/23 1959   10/04/23 2000  piperacillin-tazobactam (ZOSYN) IVPB 3.375 g  Status:  Discontinued        3.375 g 12.5 mL/hr over 240 Minutes Intravenous Every 8 hours 10/04/23 1827 10/05/23 0721   09/27/23  2315  piperacillin-tazobactam (ZOSYN) IVPB 3.375 g  Status:  Discontinued        3.375 g 12.5 mL/hr over 240 Minutes Intravenous Every 8 hours 09/27/23 2305 10/02/23 1106       Assessment/Plan: POD 4, s/p ex lap, Harmtman's resection and colostomy, and small bowel resection, Dr. [name] 10/01/23 -needs to have NGT replaced.  I discussed this at length with the patient as well as with nursing at bedside.  Despite the patient being oriented to orientation questions, she is still situationally confused and doesn't understand why I'm recommending replacement of her NGT.  We discussed that her small bowel is dilated and this puts her SB anastomosis as risk for breakdown and leakage which can make her very ill/kill her/need for repeat operation.  We also discussed her risk of aspiration and that could lead to ventilator dependence vs sepsis/death as well.  She never actually confirms that she understands this.  She transitions into talking about how dirty her bed was and how much she vomited earlier and that that made her better. - WTD dressing changes -follow therapy recs -heparin gtt for new PEs, on hold and even reversed last night.  Has received 4 units of pRBC since last night. -last hgb 7.8 -CT from overnight with pelvic hematoma and hematoma around SB anastomosis as well.  Some small dots of air, which are no unexpected only 3-4 days out from surgery.  Patient looks very stable with no pain in her abdomen.  Do not suspect any leak or infectious complications.   -would only do zosyn for 5 days at this point. -cbc pending this morning. -spoke to granddaughter, [name], to give her an  update and to discuss my concern for her grandmother's understanding of her situation, etc.  She states that she understands and has the same concerns.  She is going to come visit in a little bit and discuss the NGT with the patient to see if this will help her.  FEN: NPO/IVFs/NGT written for if patient  agreeable to have it placed. VTE: heparin gtt on hold, getting blood ID: Zosyn  PEs/DVTs - would benefit from IR IVC filter placement at this point while anticoagulation held. A fib HTN ABL anemia - getting 4th unit right now   LOS: 8 days    Letha Cape 10/05/2023

## 2023-10-05 NOTE — Progress Notes (Signed)
Elizabeth Odom   DOB:30-Jun-1941   QM#:[id number]   BMW#:[id number]  Hematology follow-up   subjective: Her CT scan showed hematoma in the surgical site, so heparin drip was stopped and she underwent IVC filter placement by IR today.  Due to the small bowel obstruction on CT scan, patient was also placed on NG tube suction.  Family at the bedside.  Patient received 2 units of blood transfusion yesterday, and additional 2 units earlier this morning.   Objective:  Vitals:   10/05/23 1530 10/05/23 1600  BP: (!) 126/59 (!) 129/56  Pulse: 72 65  Resp: (!) 24 15  Temp:    SpO2: 99% 99%    Body mass index is 22.75 kg/m.  Intake/Output Summary (Last 24 hours) at 10/05/2023 1808 Last data filed at 10/05/2023 1728 Gross per 24 hour  Intake 1973.77 ml  Output 1413 ml  Net 560.77 ml     Sclerae unicteric  Oropharynx clear  No peripheral adenopathy  Lungs clear -- no rales or rhonchi  Heart regular rate and rhythm  Abdomen benign  MSK no focal spinal tenderness, no peripheral edema  Neuro nonfocal    CBG (last 3)  Recent Labs    10/05/23 0822 10/05/23 1250 10/05/23 1615  GLUCAP 145* 132* 98     Labs:  Lab Results  Component Value Date   WBC 19.2 (H) 10/05/2023   HGB 11.1 (L) 10/05/2023   HCT 33.2 (L) 10/05/2023   MCV 93.3 10/05/2023   PLT 227 10/05/2023   NEUTROABS 10.9 (H) 09/28/2023     Urine Studies No results for input(s): "UHGB", "CRYS" in the last 72 hours.  Invalid input(s): "UACOL", "UAPR", "USPG", "UPH", "UTP", "UGL", "UKET", "UBIL", "UNIT", "UROB", "ULEU", "UEPI", "UWBC", "URBC", "UBAC", "CAST", "UCOM", "BILUA"  Basic Metabolic Panel: Recent Labs  Lab 10/02/23 0408 10/02/23 1944 10/03/23 0345 10/04/23 0359 10/05/23 0247  NA 137 135 137 140 140  K 3.8 4.0 4.3 4.0 3.3*  CL 104 103 105 107 108  CO2 27 26 24 26 26   GLUCOSE 249* 146* 176* 133* 143*  BUN 19 34* 30* 30* 30*  CREATININE 0.92 0.68 0.59 0.58 0.75  CALCIUM 8.0* 7.8* 8.1* 8.5* 8.1*  MG  --    --  1.8  --  2.2  PHOS  --   --  2.0*  --  2.5   GFR Estimated Creatinine Clearance: 48.8 mL/min (by C-G formula based on SCr of 0.75 mg/dL). Liver Function Tests: No results for input(s): "AST", "ALT", "ALKPHOS", "BILITOT", "PROT", "ALBUMIN" in the last 168 hours. No results for input(s): "LIPASE", "AMYLASE" in the last 168 hours. No results for input(s): "AMMONIA" in the last 168 hours. Coagulation profile Recent Labs  Lab 10/05/23 0247  INR 1.1    CBC: Recent Labs  Lab 10/01/23 0326 10/02/23 0408 10/03/23 0345 10/03/23 2124 10/04/23 0359 10/04/23 1239 10/04/23 1608 10/04/23 2021 10/05/23 0247 10/05/23 1016  WBC 11.3* 13.7* 16.8*  --  19.4*  --   --   --   --  19.2*  HGB 13.1 10.2* 8.6*   < > 6.8* 7.4* 7.5* 7.0* 7.8* 11.1*  HCT 40.3 31.4* 25.8*   < > 21.2* 23.2* 23.4* 21.4* 24.0* 33.2*  MCV 94.8 94.6 94.5  --  96.4  --   --   --   --  93.3  PLT 409* 314 325  --  263  --   --   --   --  227   < > =  values in this interval not displayed.   Cardiac Enzymes: No results for input(s): "CKTOTAL", "CKMB", "CKMBINDEX", "TROPONINI" in the last 168 hours. BNP: Invalid input(s): "POCBNP" CBG: Recent Labs  Lab 10/04/23 2344 10/05/23 0423 10/05/23 0822 10/05/23 1250 10/05/23 1615  GLUCAP 116* 138* 145* 132* 98   D-Dimer No results for input(s): "DDIMER" in the last 72 hours. Hgb A1c No results for input(s): "HGBA1C" in the last 72 hours. Lipid Profile No results for input(s): "CHOL", "HDL", "LDLCALC", "TRIG", "CHOLHDL", "LDLDIRECT" in the last 72 hours. Thyroid function studies No results for input(s): "TSH", "T4TOTAL", "T3FREE", "THYROIDAB" in the last 72 hours.  Invalid input(s): "FREET3" Anemia work up Recent Labs    10/04/23 1239  VITAMINB12 3,343*  FOLATE 11.2  FERRITIN 166  TIBC 228*  IRON 22*  RETICCTPCT 4.4*   Microbiology Recent Results (from the past 240 hour(s))  Surgical pcr screen     Status: Abnormal   Collection Time: 10/01/23  4:19 AM    Specimen: Nasal Mucosa; Nasal Swab  Result Value Ref Range Status   MRSA, PCR POSITIVE (A) NEGATIVE Final    Comment: RESULT CALLED TO, READ BACK BY AND VERIFIED WITH: NGUYEN, T. RN AT [phone] ON 10/01/2023 BY MECIAL J.    Staphylococcus aureus POSITIVE (A) NEGATIVE Final    Comment: (NOTE) The Xpert SA Assay (FDA approved for NASAL specimens in patients 80 years of age and older), is one component of a comprehensive surveillance program. It is not intended to diagnose infection nor to guide or monitor treatment. Performed at [facility], [address], [place], [place] [address]       Studies:  IR IVC FILTER PLMT / S&I Lenise Arena GUID/MOD SED  Result Date: 10/05/2023 CLINICAL DATA:  Lower extremity DVT, pulmonary emboli. Postoperative hemorrhage, a relative contraindication to anticoagulation. Caval filtration requested. EXAM: INFERIOR VENACAVOGRAM IVC FILTER PLACEMENT UNDER FLUOROSCOPY FLUOROSCOPY: Radiation Exposure Index (as provided by the fluoroscopic device): 25 MGy air Kerma TECHNIQUE: Patency of the right IJ vein was confirmed with ultrasound with image documentation. An appropriate skin site was determined. Skin site was marked, prepped with chlorhexidine, and draped using maximum barrier technique. The region was infiltrated locally with 1% lidocaine. Intravenous Fentanyl and Versed 1mg  were administered as conscious sedation during continuous monitoring of the patient's level of consciousness and physiological / cardiorespiratory status by the radiology RN, with a total moderate sedation time of 14 minutes. Under real-time ultrasound guidance, the right IJ vein was accessed with a 21 gauge micropuncture needle; the needle tip within the vein was confirmed with ultrasound image documentation. The needle was exchanged over a 018 guidewire for a transitional dilator, which allow advancement of the [facility] wire into the IVC. A long 6 French vascular sheath was placed  for inferior venacavography. This demonstrated no caval thrombus. Renal vein inflows were evident. The retrievable Denali IVC filter was advanced through the sheath and successfully deployed under fluoroscopy at the L3 level. Followup cavagram demonstrates stable filter position and no evident complication. The sheath was removed and hemostasis achieved at the site. No immediate complication. IMPRESSION: 1. Normal IVC. No thrombus or significant anatomic variation. 2. Technically successful infrarenal IVC filter placement. This is a retrievable model. PLAN: This IVC filter is potentially retrievable. The patient will be assessed for filter retrieval by Interventional Radiology in approximately 8-12 weeks. Further recommendations regarding filter retrieval, continued surveillance or declaration of device permanence, will be made at that time. Electronically Signed   By: Algis Downs  Deanne Coffer M.D.   On: 10/05/2023 13:43   DG Abd 1 View  Result Date: 10/05/2023 CLINICAL DATA:  [id number] Encounter for nasogastric tube placement [id number]. EXAM: ABDOMEN - 1 VIEW COMPARISON:  Abdominal radiographs 10/04/2023. FINDINGS: Enteric tube tip and side port project over the stomach. IMPRESSION: Enteric tube tip and side port project over the stomach. Electronically Signed   By: Orvan Falconer M.D.   On: 10/05/2023 13:43   CT HEMATURIA WORKUP  Result Date: 10/04/2023 CLINICAL DATA:  Hematuria. EXAM: CT ABDOMEN AND PELVIS WITHOUT AND WITH CONTRAST TECHNIQUE: Multidetector CT imaging of the abdomen and pelvis was performed following the standard protocol before and following the bolus administration of intravenous contrast. RADIATION DOSE REDUCTION: This exam was performed according to the departmental dose-optimization program which includes automated exposure control, adjustment of the mA and/or kV according to patient size and/or use of iterative reconstruction technique. CONTRAST:  OMNIPAQUE IOHEXOL 300 MG/ML  SOLN COMPARISON:   Preop CT scan 09/27/2023 and older FINDINGS: Lower chest: Breathing motion at the bases. Tiny right effusion and trace left. Mild basilar atelectasis. Known pulmonary emboli again identified. Hepatobiliary: No focal liver abnormality is seen. No gallstones, gallbladder wall thickening, or biliary dilatation. Patent portal vein. Pancreas: Unremarkable. No pancreatic ductal dilatation or surrounding inflammatory changes. Spleen: Normal in size without focal abnormality. Adrenals/Urinary Tract: Adrenal glands are preserved. There is a exophytic cyst from the lower pole of the right kidney with a thin septation with calcification measuring 6.7 x 6.5 cm in the axial plane. Hounsfield units of 7. No aggressive features. Bosniak 2 lesion. No specific imaging follow-up. No enhancing renal mass. There is a punctate nonobstructing stone along the lower pole of the left kidney. Small punctate hyperdense focus measuring 3 mm along the upper pole left kidney on series 5, image 59 of the precontrast coronal. Slightly brush like appearance to the calices consistent with benign tubular ectasia. The collecting systems are nondilated. The ureters have normal course and caliber down to the bladder. No urothelial thickening, filling defect or dilatation. Grossly preserved contours of the urinary bladder. There is a small bubble of air in the lumen of the bladder. Please correlate for etiology including any recent instrumentation. Slight areas of wall thickening of the bladder but nonspecific with the adjacent process. Stomach/Bowel: Stomach is fluid-filled. The duodenal is dilated with air-fluid levels measuring up to 3.8 cm. There is also a fluid-filled dilated jejunum in the left midabdomen and pelvis. These extend down to a small bowel suture line in the central pelvis above the bladder as seen on series 7, image 65. No oral contrast. In this location is some air with mixed intermediate density material along the bowel. This could  be luminal debris although based on appearance this could be also hematoma. This loop is dilated up to 7.1 cm focally in the axial plane. Also the suture line is somewhat discontiguous in some locations as seen on coronal series 9, image 31. There is also a significant complex fluid collection posterior to this area in the pelvis anterior to the rectum above the bladder on series 7, image 72 measuring 8.9 cm. Again this could be hematoma. 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 Small-bowel resection and anastomosis. The distal ileum is more normal caliber, relatively decompressed The large bowel demonstrates some scattered stool with diverticula. Once again there has been distal colonic resection with a left lower quadrant colostomy. The loop extending to the colostomy is slightly with  wall thickening. There is rectosigmoid stump with some luminal debris. Vascular/Lymphatic: Aortic atherosclerosis. No enlarged abdominal or pelvic lymph nodes. Reproductive: Status post hysterectomy. No adnexal masses. Other: There is open anterior abdominal wall pelvic wound in the midline. Anasarca. Mesenteric stranding. Smaller areas of trace fluid throughout the mesentery. Presacral and perirectal fat edema. Few bubbles of free air which could be postoperative. Musculoskeletal: Degenerative changes along the spine. IMPRESSION: Surgical changes identified since the prior examination consistent with the provided history of partial left colectomy, colostomy with a Hartmann's pouch procedure and associated small bowel resection with anastomosis. Few scattered areas of free air are as per surgery. The suture line of the small bowel in the pelvis is a dilated loop of bowel with mixed density including some intermediate areas in the lumen. This bowel loop is dilated to 7.1 cm in the proximal jejunum and duodenal is dilated with fluid. This could be a focal developing obstruction. The more  distal small bowel is nondilated. In addition there is significant adjacent presumed hematoma in the pelvis posterior to the suture line and on the coronal images the suture line is somewhat indistinct along its course. With level of dilatation, the surrounding fluid, gas and hematoma, please correlate for any clinical evidence of a leak or dehiscence versus expected postop change. Slight wall thickening along the large bowel loop extending out to the colostomy along the left side. Diffuse colonic diverticula. Tiny pleural effusions, right-greater-than-left. Punctate nonobstructing lower pole left-sided renal stone. Bosniak 2 right-sided renal cystic lesion. No enhancing renal mass or separate collecting system dilatation. Slight wall thickening of the urinary bladder but this could relate to the adjacent process. Small bubble of air in the bladder lumen could be related to previous instrumentation but please correlate with history. Known pulmonary emboli. These are somewhat poorly seen on this abdomen and pelvis hematuria workup exam Critical Value/emergent results were called by telephone at the time of interpretation on 10/04/2023 at 3:04 pm to provider [facility] , who verbally acknowledged these results and will follow up with the surgery team. Electronically Signed   By: Karen Kays M.D.   On: 10/04/2023 18:09   VAS [place] LOWER EXTREMITY VENOUS (DVT)  Result Date: 10/04/2023  Lower Venous DVT Study Patient Name:  Elizabeth Odom  Date of Exam:   10/04/2023 Medical Rec #: [id number]       Accession #:    [id number] Date of Birth: [birth year]      Patient Gender: F Patient Age:   59 years Exam Location:  [facility] Procedure:      VAS [place] LOWER EXTREMITY VENOUS (DVT) Referring Phys: RAMESH KC --------------------------------------------------------------------------------  Indications: Pulmonary embolism.  Risk Factors: Confirmed PE. Anticoagulation: Heparin. Comparison Study: No prior studies.  Performing Technologist: Chanda Busing RVT  Examination Guidelines: A complete evaluation includes B-mode imaging, spectral Doppler, color Doppler, and power Doppler as needed of all accessible portions of each vessel. Bilateral testing is considered an integral part of a complete examination. Limited examinations for reoccurring indications may be performed as noted. The reflux portion of the exam is performed with the patient in reverse Trendelenburg.  +---------+---------------+---------+-----------+----------+--------------+ RIGHT    CompressibilityPhasicitySpontaneityPropertiesThrombus Aging +---------+---------------+---------+-----------+----------+--------------+ CFV      Full           Yes      Yes                                 +---------+---------------+---------+-----------+----------+--------------+  SFJ      Full                                                        +---------+---------------+---------+-----------+----------+--------------+ FV Prox  Full                                                        +---------+---------------+---------+-----------+----------+--------------+ FV Mid   Full                                                        +---------+---------------+---------+-----------+----------+--------------+ FV DistalFull                                                        +---------+---------------+---------+-----------+----------+--------------+ PFV      Full                                                        +---------+---------------+---------+-----------+----------+--------------+ POP      Full           Yes      Yes                                 +---------+---------------+---------+-----------+----------+--------------+ PTV      Full                                                        +---------+---------------+---------+-----------+----------+--------------+ PERO     Full                                                         +---------+---------------+---------+-----------+----------+--------------+   +---------+---------------+---------+-----------+----------+--------------+ LEFT     CompressibilityPhasicitySpontaneityPropertiesThrombus Aging +---------+---------------+---------+-----------+----------+--------------+ CFV      Full           Yes      Yes                                 +---------+---------------+---------+-----------+----------+--------------+ SFJ      Full                                                        +---------+---------------+---------+-----------+----------+--------------+  FV Prox  Full                                                        +---------+---------------+---------+-----------+----------+--------------+ FV Mid   Full                                                        +---------+---------------+---------+-----------+----------+--------------+ FV DistalFull                                                        +---------+---------------+---------+-----------+----------+--------------+ PFV      Full                                                        +---------+---------------+---------+-----------+----------+--------------+ POP      Partial        Yes      Yes                  Acute          +---------+---------------+---------+-----------+----------+--------------+ PTV      Partial                                      Acute          +---------+---------------+---------+-----------+----------+--------------+ PERO     Partial                                      Acute          +---------+---------------+---------+-----------+----------+--------------+ Gastroc  None                                         Acute          +---------+---------------+---------+-----------+----------+--------------+     Summary: RIGHT: - There is no evidence of deep vein thrombosis in the lower extremity.   - No cystic structure found in the popliteal fossa.  LEFT: - Findings consistent with acute deep vein thrombosis involving the left popliteal vein, left posterior tibial veins, and left peroneal veins. Findings consistent with acute intramuscular thrombosis involving the left gastrocnemius veins. - No cystic structure found in the popliteal fossa.  *See table(s) above for measurements and observations. Electronically signed by Heath Lark on 10/04/2023 at 5:38:11 PM.    Final    DG Abd Portable 1V  Result Date: 10/04/2023 CLINICAL DATA:  Nausea and vomiting. EXAM: PORTABLE ABDOMEN - 1 VIEW COMPARISON:  09/27/2019 for FINDINGS: The bowel gas pattern has improved from the previous exam. There is been interval decrease in previously noted colonic distension. Within the left  lower quadrant of the abdomen there are several dilated loops of small bowel which measure up to 3 mm, similar to the previous CT. IMPRESSION: 1. Interval improvement in diffuse colonic distension. 2. Persistent mild dilatation of small bowel loops in the left lower quadrant of the abdomen. Electronically Signed   By: Signa Kell M.D.   On: 10/04/2023 11:00    Assessment: 82 y.o. 82 year old female with past medical history of atrial fibrillation, not on AC, presented with acute colitis/diverticulitis, sigmoid colon stricture and bowel perforation/abscess, status post sigmoid colon resection and small bowel resection, complicated with acute PE requiring heparin infusion around her surgery, developed significant anemia after surgery.  No frank bleeding.   Anemia of blood loss, mild iron deficiency Large bowel obstruction due to rectosigmoid stricture/colitis, status post surgical resection Acute PE and DVT, heparin infusion stopped due to hematoma Atrial fibrillation not on anticoagulation at home   Plan:  -Chart reviewed, including CT scan images and her lab results.  I reviewed the findings with patient and her  family -Patient has received 4 units of blood transfusion since yesterday, anemia much improved.  Heparin was stopped and reversed yesterday due to the bleeding.  She had IVC filter placed today -Her bleeding was related to surgery and to heparin, no suspicion for bleeding disorder based on the coagulation workup. -I will follow-up as needed, please call me if anything I can help.    Malachy Mood, MD 10/05/2023  6:08 PM

## 2023-10-05 NOTE — Consult Note (Addendum)
Urology Consult Note   Requesting Attending Physician:  Lanae Boast, MD Service Providing Consult: Urology  Consulting Attending: Dr. Cardell Peach   Reason for Consult:  hematuria  HPI: Elizabeth Odom is seen in consultation for reasons noted above at the request of Lanae Boast, MD. PMH significant for cervical cancer, HTN, and atrial fibrillation not on anticoagulation.  Patient presented on 10/01/2023 for scheduled colectomy with colostomy creation/Hartmann procedure and small bowel resection in treatment of acute colitis.  Now postop day 4 I was contacted by the hospitalist regarding report of hematuria.  Patient had developed a pulmonary embolism and was on heparin infusion.  Hemoglobin had dropped from 10.2 on postop day 1, to 7.0.  This would be excessive loss for 24 hours of mild hematuria.  I requested that a CT hematuria be collected which reflected a large pelvic hematoma and hematoma around the small bowel anastomosis.  Heparin infusion was discontinued and patient was reversed.  She has been provided 4 units PRBC since yesterday.  On first meeting her this morning she was alert and seemingly oriented but I do not believe she fundamentally understands what is happening to her or the gravity.  Her responses to my explanation of the diagnostic process and potential interventions regarding her hematuria, were unrelated. Despite vomiting and a concerning dilation of the small bowel across the suture line, patient continues to refuse an NG tube.  She has not paid attention to wound and ostomy education.  ------------------  Assessment:  82 y.o. female with reported hematuria. Oriented to person/place but confused.    Recommendations: #hematuria No urine sample presently available.  Have discussed with nursing to preserve all samples for 24 hrs. with date and time so that we may assess them later. CT hematuria showed no acute urinary process, lesions, or evidence of active bleeding.  Hematoma is  in pelvis and around small bowel mentioned above. Patient will eventually require outpatient cystoscopy Will reassess later today if possible, if not tomorrow morning. Trend H&H Will follow along with you  Case and plan discussed with Dr. Cardell Peach  Past Medical History: Past Medical History:  Diagnosis Date   A-fib (HCC) 04/09/2016   Acute head injury 04/09/2016   Cervical cancer (HCC)    Cervical spine fracture, initial encounter 04/09/2016   Essential hypertension    History of anemia    Multiple fractures of cervical spine (HCC) 04/09/2016   PAC (premature atrial contraction)    Restless leg    Syncope and collapse 04/09/2016    Past Surgical History:  Past Surgical History:  Procedure Laterality Date   APPENDECTOMY     BIOPSY  09/29/2023   Procedure: BIOPSY;  Surgeon: Kerin Salen, MD;  Location: [facility];  Service: Gastroenterology;;   breast lump removal Left    CERVICAL LAMINECTOMY     COLECTOMY WITH COLOSTOMY CREATION/HARTMANN PROCEDURE N/A 10/01/2023   Procedure: COLECTOMY WITH COLOSTOMY CREATION/HARTMANN PROCEDURE;  Surgeon: Axel Filler, MD;  Location: [facility];  Service: General;  Laterality: N/A;   complete hysterectomy     EP IMPLANTABLE DEVICE N/A 04/10/2016   Procedure: Loop Recorder Insertion;  Surgeon: Marinus Maw, MD;  Location: [facility];  Service: Cardiovascular;  Laterality: N/A;   FLEXIBLE SIGMOIDOSCOPY N/A 09/29/2023   Procedure: FLEXIBLE SIGMOIDOSCOPY;  Surgeon: Kerin Salen, MD;  Location: [facility];  Service: Gastroenterology;  Laterality: N/A;   HARVEST BONE GRAFT     LOOP RECORDER REMOVAL N/A 07/08/2018   Procedure: LOOP RECORDER REMOVAL;  Surgeon: Marinus Maw, MD;  Location: [facility] CV LAB;  Service: Cardiovascular;  Laterality: N/A;    Medication: Current Facility-Administered Medications  Medication Dose Route Frequency Provider Last Rate Last Admin   acetaminophen (TYLENOL) tablet 650 mg  650 mg Oral Q6H PRN Kerin Salen, MD        Or   acetaminophen (TYLENOL) suppository 650 mg  650 mg Rectal Q6H PRN Kerin Salen, MD       bisacodyl (DULCOLAX) suppository 10 mg  10 mg Rectal Once Kerin Salen, MD       Chlorhexidine Gluconate Cloth 2 % PADS 6 each  6 each Topical Q0600 Kc, Ramesh, MD   6 each at 10/03/23 0608   dextrose 5 %-0.9 % sodium chloride infusion   Intravenous Continuous Janeann Forehand D, NP 50 mL/hr at 10/05/23 0842 New Bag at 10/05/23 0842   HYDROmorphone (DILAUDID) injection 0.5-2 mg  0.5-2 mg Intravenous Q2H PRN Axel Filler, MD   2 mg at 10/05/23 0301   insulin aspart (novoLOG) injection 0-9 Units  0-9 Units Subcutaneous Q4H Kerin Salen, MD   1 Units at 10/05/23 0835   ketorolac (TORADOL) 15 MG/ML injection 15 mg  15 mg Intravenous Q6H PRN Axel Filler, MD   15 mg at 10/05/23 0016   LORazepam (ATIVAN) tablet 0.5 mg  0.5 mg Oral Once PRN Kerin Salen, MD       mupirocin ointment (BACTROBAN) 2 % 1 Application  1 Application Nasal BID Kc, Dayna Barker, MD   1 Application at 10/04/23 2207   ondansetron (ZOFRAN) tablet 4 mg  4 mg Oral Q6H PRN Kerin Salen, MD       Or   ondansetron [facility]) injection 4 mg  4 mg Intravenous Q6H PRN Kerin Salen, MD   4 mg at 10/05/23 0616   phenol (CHLORASEPTIC) mouth spray 1 spray  1 spray Mouth/Throat PRN Kc, Dayna Barker, MD   1 spray at 10/01/23 1504   piperacillin-tazobactam (ZOSYN) IVPB 3.375 g  3.375 g Intravenous Trixie Deis, MD       prochlorperazine (COMPAZINE) injection 10 mg  10 mg Intravenous Q6H PRN Barnetta Chapel, PA-C   10 mg at 10/05/23 1610    Allergies: Allergies  Allergen Reactions   Elemental Sulfur Anaphylaxis    Social History: Social History   Tobacco Use   Smoking status: Never   Smokeless tobacco: Never  Substance Use Topics   Alcohol use: Never    Alcohol/week: 0.0 standard drinks of alcohol   Drug use: Never    Family History Family History  Problem Relation Age of Onset   Other Mother        thrombosis   Cancer Sister     Diabetes Sister    Stroke Daughter    Thyroid disease Daughter     Review of Systems  Unable to perform ROS: Mental status change     Objective   Vital signs in last 24 hours: BP (!) 120/56   Pulse 72   Temp 98.4 F (36.9 C) (Oral)   Resp 17   Ht 5\' 5"  (1.651 m)   Wt 62 kg   SpO2 97%   BMI 22.75 kg/m   Physical Exam General:  A&O, confusion, decompensated HEENT: /AT Pulmonary: Normal work of breathing Cardiovascular:  no cyanosis   Most Recent Labs: Lab Results  Component Value Date   WBC 19.4 (H) 10/04/2023   HGB 7.8 (L) 10/05/2023   HCT 24.0 (L) 10/05/2023   PLT 263 10/04/2023  Lab Results  Component Value Date   NA 140 10/05/2023   K 3.3 (L) 10/05/2023   CL 108 10/05/2023   CO2 26 10/05/2023   BUN 30 (H) 10/05/2023   CREATININE 0.75 10/05/2023   CALCIUM 8.1 (L) 10/05/2023   MG 2.2 10/05/2023   PHOS 2.5 10/05/2023    Lab Results  Component Value Date   INR 1.1 10/05/2023   APTT 30 10/05/2023     Urine Culture: @[id number] (laburin,org,[id number],[id number])@   IMAGING: CT HEMATURIA WORKUP  Result Date: 10/04/2023 CLINICAL DATA:  Hematuria. EXAM: CT ABDOMEN AND PELVIS WITHOUT AND WITH CONTRAST TECHNIQUE: Multidetector CT imaging of the abdomen and pelvis was performed following the standard protocol before and following the bolus administration of intravenous contrast. RADIATION DOSE REDUCTION: This exam was performed according to the departmental dose-optimization program which includes automated exposure control, adjustment of the mA and/or kV according to patient size and/or use of iterative reconstruction technique. CONTRAST:  OMNIPAQUE IOHEXOL 300 MG/ML  SOLN COMPARISON:  Preop CT scan 09/27/2023 and older FINDINGS: Lower chest: Breathing motion at the bases. Tiny right effusion and trace left. Mild basilar atelectasis. Known pulmonary emboli again identified. Hepatobiliary: No focal liver abnormality is seen. No gallstones, gallbladder wall  thickening, or biliary dilatation. Patent portal vein. Pancreas: Unremarkable. No pancreatic ductal dilatation or surrounding inflammatory changes. Spleen: Normal in size without focal abnormality. Adrenals/Urinary Tract: Adrenal glands are preserved. There is a exophytic cyst from the lower pole of the right kidney with a thin septation with calcification measuring 6.7 x 6.5 cm in the axial plane. Hounsfield units of 7. No aggressive features. Bosniak 2 lesion. No specific imaging follow-up. No enhancing renal mass. There is a punctate nonobstructing stone along the lower pole of the left kidney. Small punctate hyperdense focus measuring 3 mm along the upper pole left kidney on series 5, image 59 of the precontrast coronal. Slightly brush like appearance to the calices consistent with benign tubular ectasia. The collecting systems are nondilated. The ureters have normal course and caliber down to the bladder. No urothelial thickening, filling defect or dilatation. Grossly preserved contours of the urinary bladder. There is a small bubble of air in the lumen of the bladder. Please correlate for etiology including any recent instrumentation. Slight areas of wall thickening of the bladder but nonspecific with the adjacent process. Stomach/Bowel: Stomach is fluid-filled. The duodenal is dilated with air-fluid levels measuring up to 3.8 cm. There is also a fluid-filled dilated jejunum in the left midabdomen and pelvis. These extend down to a small bowel suture line in the central pelvis above the bladder as seen on series 7, image 65. No oral contrast. In this location is some air with mixed intermediate density material along the bowel. This could be luminal debris although based on appearance this could be also hematoma. This loop is dilated up to 7.1 cm focally in the axial plane. Also the suture line is somewhat discontiguous in some locations as seen on coronal series 9, image 31. There is also a significant  complex fluid collection posterior to this area in the pelvis anterior to the rectum above the bladder on series 7, image 72 measuring 8.9 cm. Again this could be hematoma. 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 Small-bowel resection and anastomosis. The distal ileum is more normal caliber, relatively decompressed The large bowel demonstrates some scattered stool with diverticula. Once again there has been distal colonic resection with a left lower quadrant colostomy. The  loop extending to the colostomy is slightly with wall thickening. There is rectosigmoid stump with some luminal debris. Vascular/Lymphatic: Aortic atherosclerosis. No enlarged abdominal or pelvic lymph nodes. Reproductive: Status post hysterectomy. No adnexal masses. Other: There is open anterior abdominal wall pelvic wound in the midline. Anasarca. Mesenteric stranding. Smaller areas of trace fluid throughout the mesentery. Presacral and perirectal fat edema. Few bubbles of free air which could be postoperative. Musculoskeletal: Degenerative changes along the spine. IMPRESSION: Surgical changes identified since the prior examination consistent with the provided history of partial left colectomy, colostomy with a Hartmann's pouch procedure and associated small bowel resection with anastomosis. Few scattered areas of free air are as per surgery. The suture line of the small bowel in the pelvis is a dilated loop of bowel with mixed density including some intermediate areas in the lumen. This bowel loop is dilated to 7.1 cm in the proximal jejunum and duodenal is dilated with fluid. This could be a focal developing obstruction. The more distal small bowel is nondilated. In addition there is significant adjacent presumed hematoma in the pelvis posterior to the suture line and on the coronal images the suture line is somewhat indistinct along its course. With level of dilatation, the surrounding fluid,  gas and hematoma, please correlate for any clinical evidence of a leak or dehiscence versus expected postop change. Slight wall thickening along the large bowel loop extending out to the colostomy along the left side. Diffuse colonic diverticula. Tiny pleural effusions, right-greater-than-left. Punctate nonobstructing lower pole left-sided renal stone. Bosniak 2 right-sided renal cystic lesion. No enhancing renal mass or separate collecting system dilatation. Slight wall thickening of the urinary bladder but this could relate to the adjacent process. Small bubble of air in the bladder lumen could be related to previous instrumentation but please correlate with history. Known pulmonary emboli. These are somewhat poorly seen on this abdomen and pelvis hematuria workup exam Critical Value/emergent results were called by telephone at the time of interpretation on 10/04/2023 at 3:04 pm to provider [facility] , who verbally acknowledged these results and will follow up with the surgery team. Electronically Signed   By: Karen Kays M.D.   On: 10/04/2023 18:09   VAS [place] LOWER EXTREMITY VENOUS (DVT)  Result Date: 10/04/2023  Lower Venous DVT Study Patient Name:  RIHONNA RAJ  Date of Exam:   10/04/2023 Medical Rec #: [id number]       Accession #:    [id number] Date of Birth: 1941/01/22      Patient Gender: F Patient Age:   82 years Exam Location:  [facility] Procedure:      VAS [place] LOWER EXTREMITY VENOUS (DVT) Referring Phys: RAMESH KC --------------------------------------------------------------------------------  Indications: Pulmonary embolism.  Risk Factors: Confirmed PE. Anticoagulation: Heparin. Comparison Study: No prior studies. Performing Technologist: Chanda Busing RVT  Examination Guidelines: A complete evaluation includes B-mode imaging, spectral Doppler, color Doppler, and power Doppler as needed of all accessible portions of each vessel. Bilateral testing is considered an integral part of a  complete examination. Limited examinations for reoccurring indications may be performed as noted. The reflux portion of the exam is performed with the patient in reverse Trendelenburg.  +---------+---------------+---------+-----------+----------+--------------+ RIGHT    CompressibilityPhasicitySpontaneityPropertiesThrombus Aging +---------+---------------+---------+-----------+----------+--------------+ CFV      Full           Yes      Yes                                 +---------+---------------+---------+-----------+----------+--------------+  SFJ      Full                                                        +---------+---------------+---------+-----------+----------+--------------+ FV Prox  Full                                                        +---------+---------------+---------+-----------+----------+--------------+ FV Mid   Full                                                        +---------+---------------+---------+-----------+----------+--------------+ FV DistalFull                                                        +---------+---------------+---------+-----------+----------+--------------+ PFV      Full                                                        +---------+---------------+---------+-----------+----------+--------------+ POP      Full           Yes      Yes                                 +---------+---------------+---------+-----------+----------+--------------+ PTV      Full                                                        +---------+---------------+---------+-----------+----------+--------------+ PERO     Full                                                        +---------+---------------+---------+-----------+----------+--------------+   +---------+---------------+---------+-----------+----------+--------------+ LEFT     CompressibilityPhasicitySpontaneityPropertiesThrombus Aging  +---------+---------------+---------+-----------+----------+--------------+ CFV      Full           Yes      Yes                                 +---------+---------------+---------+-----------+----------+--------------+ SFJ      Full                                                        +---------+---------------+---------+-----------+----------+--------------+  FV Prox  Full                                                        +---------+---------------+---------+-----------+----------+--------------+ FV Mid   Full                                                        +---------+---------------+---------+-----------+----------+--------------+ FV DistalFull                                                        +---------+---------------+---------+-----------+----------+--------------+ PFV      Full                                                        +---------+---------------+---------+-----------+----------+--------------+ POP      Partial        Yes      Yes                  Acute          +---------+---------------+---------+-----------+----------+--------------+ PTV      Partial                                      Acute          +---------+---------------+---------+-----------+----------+--------------+ PERO     Partial                                      Acute          +---------+---------------+---------+-----------+----------+--------------+ Gastroc  None                                         Acute          +---------+---------------+---------+-----------+----------+--------------+     Summary: RIGHT: - There is no evidence of deep vein thrombosis in the lower extremity.  - No cystic structure found in the popliteal fossa.  LEFT: - Findings consistent with acute deep vein thrombosis involving the left popliteal vein, left posterior tibial veins, and left peroneal veins. Findings consistent with acute intramuscular thrombosis  involving the left gastrocnemius veins. - No cystic structure found in the popliteal fossa.  *See table(s) above for measurements and observations. Electronically signed by Heath Lark on 10/04/2023 at 5:38:11 PM.    Final    DG Abd Portable 1V  Result Date: 10/04/2023 CLINICAL DATA:  Nausea and vomiting. EXAM: PORTABLE ABDOMEN - 1 VIEW COMPARISON:  09/27/2019 for FINDINGS: The bowel gas pattern has improved from the previous exam. There is been interval decrease in previously noted colonic distension. Within the left  lower quadrant of the abdomen there are several dilated loops of small bowel which measure up to 3 mm, similar to the previous CT. IMPRESSION: 1. Interval improvement in diffuse colonic distension. 2. Persistent mild dilatation of small bowel loops in the left lower quadrant of the abdomen. Electronically Signed   By: Signa Kell M.D.   On: 10/04/2023 11:00    ------  Elmon Kirschner, NP Pager: [phone]   Please contact the urology consult pager with any further questions/concerns.  I agree with above.  Patient with amber urine racked at bedside.  No clots present.  No difficulty voiding.  Recommend PVR at least once to ensure adequate bladder emptying.  No obvious GU pathology on CT hematuria protocol 10/04/2023.  She has slight thickening of the bladder wall.  Recommend outpatient cystoscopy.  Will arrange follow-up.  Following along peripherally.  Please call if questions.  Matt R. Lemont Sitzmann MD [facility]  Pager: [phone]

## 2023-10-05 NOTE — Plan of Care (Signed)
  Problem: Education: Goal: Ability to describe self-care measures that may prevent or decrease complications (Diabetes Survival Skills Education) will improve Outcome: Progressing   Problem: Coping: Goal: Ability to adjust to condition or change in health will improve Outcome: Progressing   Problem: Fluid Volume: Goal: Ability to maintain a balanced intake and output will improve Outcome: Progressing   Problem: Health Behavior/Discharge Planning: Goal: Ability to identify and utilize available resources and services will improve Outcome: Progressing Goal: Ability to manage health-related needs will improve Outcome: Progressing   Problem: Metabolic: Goal: Ability to maintain appropriate glucose levels will improve Outcome: Progressing   Problem: Skin Integrity: Goal: Risk for impaired skin integrity will decrease Outcome: Progressing   Problem: Tissue Perfusion: Goal: Adequacy of tissue perfusion will improve Outcome: Progressing   Problem: Clinical Measurements: Goal: Ability to maintain clinical measurements within normal limits will improve Outcome: Progressing Goal: Diagnostic test results will improve Outcome: Progressing

## 2023-10-05 NOTE — Consult Note (Addendum)
Chief Complaint: Patient was seen in consultation today for image guided inferior venacavogram with inferior vena cava filter placement Chief Complaint  Patient presents with   Abdominal Pain    Referring Physician(s): Kc,R  Supervising Physician: Oley Balm  Patient Status: [facility] - In-pt  History of Present Illness: Elizabeth Odom is an 82 y.o. female with past medical history of A-fib, cervical cancer, hypertension, anemia who is status post exploratory lap, Hartman's resection and colostomy and small bowel resection on 10/01/2023 for sigmoid stricture.  Recent imaging has revealed bilateral PE as well as hematoma in the pelvis posterior to suture line.  Patient has also had recent hematuria and well as projectile vomiting earlier this morning. Lower extremity venous Doppler yesterday revealed acute DVT involving left popliteal, left posterior tibial and left peroneal veins as well as acute intramuscular thrombosis involving the left gastrocnemius veins.  Patient previously on heparin drip for PE however that is now on hold and was reversed last night.  Pt has  received a total of 4 units of packed red cells since last night.  Hemoglobin 7.8, creatinine normal.  PT/INR normal.  Request now received for IVC filter placement.   Past Medical History:  Diagnosis Date   A-fib (HCC) 04/09/2016   Acute head injury 04/09/2016   Cervical cancer (HCC)    Cervical spine fracture, initial encounter 04/09/2016   Essential hypertension    History of anemia    Multiple fractures of cervical spine (HCC) 04/09/2016   PAC (premature atrial contraction)    Restless leg    Syncope and collapse 04/09/2016    Past Surgical History:  Procedure Laterality Date   APPENDECTOMY     BIOPSY  09/29/2023   Procedure: BIOPSY;  Surgeon: Kerin Salen, MD;  Location: [facility];  Service: Gastroenterology;;   breast lump removal Left    CERVICAL LAMINECTOMY     COLECTOMY WITH COLOSTOMY CREATION/HARTMANN  PROCEDURE N/A 10/01/2023   Procedure: COLECTOMY WITH COLOSTOMY CREATION/HARTMANN PROCEDURE;  Surgeon: Axel Filler, MD;  Location: [facility];  Service: General;  Laterality: N/A;   complete hysterectomy     EP IMPLANTABLE DEVICE N/A 04/10/2016   Procedure: Loop Recorder Insertion;  Surgeon: Marinus Maw, MD;  Location: [facility];  Service: Cardiovascular;  Laterality: N/A;   FLEXIBLE SIGMOIDOSCOPY N/A 09/29/2023   Procedure: FLEXIBLE SIGMOIDOSCOPY;  Surgeon: Kerin Salen, MD;  Location: [facility];  Service: Gastroenterology;  Laterality: N/A;   HARVEST BONE GRAFT     LOOP RECORDER REMOVAL N/A 07/08/2018   Procedure: LOOP RECORDER REMOVAL;  Surgeon: Marinus Maw, MD;  Location: [facility];  Service: Cardiovascular;  Laterality: N/A;    Allergies: Elemental sulfur  Medications: Prior to Admission medications   Medication Sig Start Date End Date Taking? Authorizing Provider  amoxicillin-clavulanate (AUGMENTIN) 875-125 MG tablet Take 1 tablet by mouth every 12 (twelve) hours. 09/21/23  Yes Horton, Kristie M, DO  ondansetron (ZOFRAN-ODT) 4 MG disintegrating tablet Take 4 mg by mouth every 8 (eight) hours as needed for vomiting or nausea. 09/16/23  Yes [provider]  meclizine (ANTIVERT) 25 MG tablet Take 25 mg by mouth 3 (three) times daily. Patient not taking: Reported on 11/23/2020    [provider]  Omega-3 Fatty Acids (FISH OIL) 500 MG CAPS Take 1 capsule by mouth daily. Patient not taking: Reported on 09/27/2023    [provider]     Family History  Problem Relation Age of Onset   Other Mother  thrombosis   Cancer Sister    Diabetes Sister    Stroke Daughter    Thyroid disease Daughter     Social History   Socioeconomic History   Marital status: Widowed    Spouse name: Not on file   Number of children: 3   Years of education: schooling in [place]   Highest education level: Not on file  Occupational History   Not on  file  Tobacco Use   Smoking status: Never   Smokeless tobacco: Never  Substance and Sexual Activity   Alcohol use: Never    Alcohol/week: 0.0 standard drinks of alcohol   Drug use: Never   Sexual activity: Not on file  Other Topics Concern   Not on file  Social History Narrative   Patient is originally from [place]. 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 Her emergency contact/medical decision maker is her granddaughter:  [name] [phone]).   Social Determinants of Health   Financial Resource Strain: Not on file  Food Insecurity: No Food Insecurity (09/28/2023)   Hunger Vital Sign    Worried About Running Out of Food in the Last Year: Never true    Ran Out of Food in the Last Year: Never true  Transportation Needs: No Transportation Needs (09/28/2023)   PRAPARE - Administrator, Civil Service (Medical): No    Lack of Transportation (Non-Medical): No  Physical Activity: Not on file  Stress: Not on file  Social Connections: Not on file      Review of Systems see above  Vital Signs: BP (!) 142/50   Pulse 67   Temp 98.4 F (36.9 C) (Oral)   Resp 17   Ht 5\' 5"  (1.651 m)   Wt 136 lb 11 oz (62 kg)   SpO2 97%   BMI 22.75 kg/m   Advance Care Plan: No documents on file   Physical Exam: Patient awake, conversant.  Answers simple questions okay but unsure of complete comprehension; NG tube in place ;chest with slightly diminished breath sounds bases.  Heart with normal rate, some occasional ectopy.  Abdomen slightly distended, soft, few bowel sounds, some mild lower abdominal tenderness to palpation.  No significant lower extremity edema.  Imaging: CT HEMATURIA WORKUP  Result Date: 10/04/2023 CLINICAL DATA:  Hematuria. EXAM: CT ABDOMEN AND PELVIS WITHOUT AND WITH CONTRAST TECHNIQUE: Multidetector CT imaging of the abdomen and pelvis was performed following the standard protocol before  and following the bolus administration of intravenous contrast. RADIATION DOSE REDUCTION: This exam was performed according to the departmental dose-optimization program which includes automated exposure control, adjustment of the mA and/or kV according to patient size and/or use of iterative reconstruction technique. CONTRAST:  OMNIPAQUE IOHEXOL 300 MG/ML  SOLN COMPARISON:  Preop CT scan 09/27/2023 and older FINDINGS: Lower chest: Breathing motion at the bases. Tiny right effusion and trace left. Mild basilar atelectasis. Known pulmonary emboli again identified. Hepatobiliary: No focal liver abnormality is seen. No gallstones, gallbladder wall thickening, or biliary dilatation. Patent portal vein. Pancreas: Unremarkable. No pancreatic ductal dilatation or surrounding inflammatory changes. Spleen: Normal in size without focal abnormality. Adrenals/Urinary Tract: Adrenal glands are preserved. There is a exophytic cyst from the lower pole of the right kidney with a thin septation with calcification measuring 6.7 x 6.5 cm in the axial plane. Hounsfield units of 7. No aggressive features. Bosniak 2 lesion. No specific imaging follow-up. No enhancing renal mass. There is  a punctate nonobstructing stone along the lower pole of the left kidney. Small punctate hyperdense focus measuring 3 mm along the upper pole left kidney on series 5, image 59 of the precontrast coronal. Slightly brush like appearance to the calices consistent with benign tubular ectasia. The collecting systems are nondilated. The ureters have normal course and caliber down to the bladder. No urothelial thickening, filling defect or dilatation. Grossly preserved contours of the urinary bladder. There is a small bubble of air in the lumen of the bladder. Please correlate for etiology including any recent instrumentation. Slight areas of wall thickening of the bladder but nonspecific with the adjacent process. Stomach/Bowel: Stomach is fluid-filled.  The duodenal is dilated with air-fluid levels measuring up to 3.8 cm. There is also a fluid-filled dilated jejunum in the left midabdomen and pelvis. These extend down to a small bowel suture line in the central pelvis above the bladder as seen on series 7, image 65. No oral contrast. In this location is some air with mixed intermediate density material along the bowel. This could be luminal debris although based on appearance this could be also hematoma. This loop is dilated up to 7.1 cm focally in the axial plane. Also the suture line is somewhat discontiguous in some locations as seen on coronal series 9, image 31. There is also a significant complex fluid collection posterior to this area in the pelvis anterior to the rectum above the bladder on series 7, image 72 measuring 8.9 cm. Again this could be hematoma. 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 Small-bowel resection and anastomosis. The distal ileum is more normal caliber, relatively decompressed The large bowel demonstrates some scattered stool with diverticula. Once again there has been distal colonic resection with a left lower quadrant colostomy. The loop extending to the colostomy is slightly with wall thickening. There is rectosigmoid stump with some luminal debris. Vascular/Lymphatic: Aortic atherosclerosis. No enlarged abdominal or pelvic lymph nodes. Reproductive: Status post hysterectomy. No adnexal masses. Other: There is open anterior abdominal wall pelvic wound in the midline. Anasarca. Mesenteric stranding. Smaller areas of trace fluid throughout the mesentery. Presacral and perirectal fat edema. Few bubbles of free air which could be postoperative. Musculoskeletal: Degenerative changes along the spine. IMPRESSION: Surgical changes identified since the prior examination consistent with the provided history of partial left colectomy, colostomy with a Hartmann's pouch procedure and associated  small bowel resection with anastomosis. Few scattered areas of free air are as per surgery. The suture line of the small bowel in the pelvis is a dilated loop of bowel with mixed density including some intermediate areas in the lumen. This bowel loop is dilated to 7.1 cm in the proximal jejunum and duodenal is dilated with fluid. This could be a focal developing obstruction. The more distal small bowel is nondilated. In addition there is significant adjacent presumed hematoma in the pelvis posterior to the suture line and on the coronal images the suture line is somewhat indistinct along its course. With level of dilatation, the surrounding fluid, gas and hematoma, please correlate for any clinical evidence of a leak or dehiscence versus expected postop change. Slight wall thickening along the large bowel loop extending out to the colostomy along the left side. Diffuse colonic diverticula. Tiny pleural effusions, right-greater-than-left. Punctate nonobstructing lower pole left-sided renal stone. Bosniak 2 right-sided renal cystic lesion. No enhancing renal mass or separate collecting system dilatation. Slight wall thickening of the urinary bladder but this could relate to  the adjacent process. Small bubble of air in the bladder lumen could be related to previous instrumentation but please correlate with history. Known pulmonary emboli. These are somewhat poorly seen on this abdomen and pelvis hematuria workup exam Critical Value/emergent results were called by telephone at the time of interpretation on 10/04/2023 at 3:04 pm to provider [facility] , who verbally acknowledged these results and will follow up with the surgery team. Electronically Signed   By: Karen Kays M.D.   On: 10/04/2023 18:09   VAS [place] LOWER EXTREMITY VENOUS (DVT)  Result Date: 10/04/2023  Lower Venous DVT Study Patient Name:  SINAY SNELL  Date of Exam:   10/04/2023 Medical Rec #: [id number]       Accession #:    [id number] Date of Birth:  [birth year]      Patient Gender: F Patient Age:   59 years Exam Location:  [facility] Procedure:      VAS [place] LOWER EXTREMITY VENOUS (DVT) Referring Phys: RAMESH KC --------------------------------------------------------------------------------  Indications: Pulmonary embolism.  Risk Factors: Confirmed PE. Anticoagulation: Heparin. Comparison Study: No prior studies. Performing Technologist: Chanda Busing RVT  Examination Guidelines: A complete evaluation includes B-mode imaging, spectral Doppler, color Doppler, and power Doppler as needed of all accessible portions of each vessel. Bilateral testing is considered an integral part of a complete examination. Limited examinations for reoccurring indications may be performed as noted. The reflux portion of the exam is performed with the patient in reverse Trendelenburg.  +---------+---------------+---------+-----------+----------+--------------+ RIGHT    CompressibilityPhasicitySpontaneityPropertiesThrombus Aging +---------+---------------+---------+-----------+----------+--------------+ CFV      Full           Yes      Yes                                 +---------+---------------+---------+-----------+----------+--------------+ SFJ      Full                                                        +---------+---------------+---------+-----------+----------+--------------+ FV Prox  Full                                                        +---------+---------------+---------+-----------+----------+--------------+ FV Mid   Full                                                        +---------+---------------+---------+-----------+----------+--------------+ FV DistalFull                                                        +---------+---------------+---------+-----------+----------+--------------+ PFV      Full                                                         +---------+---------------+---------+-----------+----------+--------------+  POP      Full           Yes      Yes                                 +---------+---------------+---------+-----------+----------+--------------+ PTV      Full                                                        +---------+---------------+---------+-----------+----------+--------------+ PERO     Full                                                        +---------+---------------+---------+-----------+----------+--------------+   +---------+---------------+---------+-----------+----------+--------------+ LEFT     CompressibilityPhasicitySpontaneityPropertiesThrombus Aging +---------+---------------+---------+-----------+----------+--------------+ CFV      Full           Yes      Yes                                 +---------+---------------+---------+-----------+----------+--------------+ SFJ      Full                                                        +---------+---------------+---------+-----------+----------+--------------+ FV Prox  Full                                                        +---------+---------------+---------+-----------+----------+--------------+ FV Mid   Full                                                        +---------+---------------+---------+-----------+----------+--------------+ FV DistalFull                                                        +---------+---------------+---------+-----------+----------+--------------+ PFV      Full                                                        +---------+---------------+---------+-----------+----------+--------------+ POP      Partial        Yes      Yes                  Acute          +---------+---------------+---------+-----------+----------+--------------+  PTV      Partial                                      Acute           +---------+---------------+---------+-----------+----------+--------------+ PERO     Partial                                      Acute          +---------+---------------+---------+-----------+----------+--------------+ Gastroc  None                                         Acute          +---------+---------------+---------+-----------+----------+--------------+     Summary: RIGHT: - There is no evidence of deep vein thrombosis in the lower extremity.  - No cystic structure found in the popliteal fossa.  LEFT: - Findings consistent with acute deep vein thrombosis involving the left popliteal vein, left posterior tibial veins, and left peroneal veins. Findings consistent with acute intramuscular thrombosis involving the left gastrocnemius veins. - No cystic structure found in the popliteal fossa.  *See table(s) above for measurements and observations. Electronically signed by Heath Lark on 10/04/2023 at 5:38:11 PM.    Final    DG Abd Portable 1V  Result Date: 10/04/2023 CLINICAL DATA:  Nausea and vomiting. EXAM: PORTABLE ABDOMEN - 1 VIEW COMPARISON:  09/27/2019 for FINDINGS: The bowel gas pattern has improved from the previous exam. There is been interval decrease in previously noted colonic distension. Within the left lower quadrant of the abdomen there are several dilated loops of small bowel which measure up to 3 mm, similar to the previous CT. IMPRESSION: 1. Interval improvement in diffuse colonic distension. 2. Persistent mild dilatation of small bowel loops in the left lower quadrant of the abdomen. Electronically Signed   By: Signa Kell M.D.   On: 10/04/2023 11:00   ECHOCARDIOGRAM COMPLETE  Result Date: 10/01/2023    ECHOCARDIOGRAM REPORT   Patient Name:   Elizabeth Odom Date of Exam: 10/01/2023 Medical Rec #:  [id number]      Height:       66.0 in Accession #:    [id number]     Weight:       124.0 lb Date of Birth:  1941-11-23     BSA:          1.632 m Patient Age:    81 years        BP:           120/54 mmHg Patient Gender: F              HR:           86 bpm. Exam Location:  Inpatient Procedure: 2D Echo, Cardiac Doppler and Color Doppler Indications:    Chest Pain R07.9  History:        Patient has prior history of Echocardiogram examinations, most                 recent 04/10/2016. Arrythmias:Atrial Fibrillation; Risk                 Factors:Non-Smoker.  Sonographer:    Dondra Prader RVT RCS Referring Phys: [phone] [facility]  Sonographer Comments: Patient refused to reposition; exam done with patient supine. IMPRESSIONS  1. Left ventricular ejection fraction, by estimation, is 70 to 75%. The left ventricle has hyperdynamic function. The left ventricle has no regional wall motion abnormalities. There is mild concentric left ventricular hypertrophy. Left ventricular diastolic parameters are consistent with Grade I diastolic dysfunction (impaired relaxation).  2. Right ventricular systolic function is normal. The right ventricular size is normal.  3. The mitral valve is normal in structure. Trivial mitral valve regurgitation. No evidence of mitral stenosis.  4. The aortic valve is tricuspid. There is mild calcification of the aortic valve. Aortic valve regurgitation is not visualized. Aortic valve sclerosis/calcification is present, without any evidence of aortic stenosis.  5. The inferior vena cava is normal in size with greater than 50% respiratory variability, suggesting right atrial pressure of 3 mmHg. FINDINGS  Left Ventricle: Left ventricular ejection fraction, by estimation, is 70 to 75%. The left ventricle has hyperdynamic function. The left ventricle has no regional wall motion abnormalities. The left ventricular internal cavity size was normal in size. There is mild concentric left ventricular hypertrophy. Left ventricular diastolic parameters are consistent with Grade I diastolic dysfunction (impaired relaxation). Right Ventricle: The right ventricular size is normal. No increase in  right ventricular wall thickness. Right ventricular systolic function is normal. Left Atrium: Left atrial size was normal in size. Right Atrium: Right atrial size was normal in size. Pericardium: There is no evidence of pericardial effusion. Mitral Valve: The mitral valve is normal in structure. Trivial mitral valve regurgitation. No evidence of mitral valve stenosis. Tricuspid Valve: The tricuspid valve is normal in structure. Tricuspid valve regurgitation is trivial. No evidence of tricuspid stenosis. Aortic Valve: The aortic valve is tricuspid. There is mild calcification of the aortic valve. Aortic valve regurgitation is not visualized. Aortic valve sclerosis/calcification is present, without any evidence of aortic stenosis. Aortic valve mean gradient measures 3.0 mmHg. Aortic valve peak gradient measures 5.6 mmHg. Aortic valve area, by VTI measures 2.71 cm. Pulmonic Valve: The pulmonic valve was normal in structure. Pulmonic valve regurgitation is not visualized. No evidence of pulmonic stenosis. Aorta: The aortic root is normal in size and structure. Venous: The inferior vena cava is normal in size with greater than 50% respiratory variability, suggesting right atrial pressure of 3 mmHg. IAS/Shunts: No atrial level shunt detected by color flow Doppler.  LEFT VENTRICLE PLAX 2D LVIDd:         3.70 cm   Diastology LVIDs:         2.10 cm   LV e' medial:    4.13 cm/s LV PW:         0.90 cm   LV E/e' medial:  16.0 LV IVS:        1.00 cm   LV e' lateral:   11.00 cm/s LVOT diam:     2.10 cm   LV E/e' lateral: 6.0 LV SV:         54 LV SV Index:   33 LVOT Area:     3.46 cm  RIGHT VENTRICLE RV S prime:     18.30 cm/s LEFT ATRIUM         Index LA diam:    2.70 cm 1.65 cm/m  AORTIC VALVE                    PULMONIC VALVE AV Area (Vmax):    2.68 cm     PV Vmax:  1.17 m/s AV Area (Vmean):   2.49 cm     PV Peak grad:  5.5 mmHg AV Area (VTI):     2.71 cm AV Vmax:           118.00 cm/s AV Vmean:          79.900 cm/s  AV VTI:            0.198 m AV Peak Grad:      5.6 mmHg AV Mean Grad:      3.0 mmHg LVOT Vmax:         91.20 cm/s LVOT Vmean:        57.500 cm/s LVOT VTI:          0.155 m LVOT/AV VTI ratio: 0.78  AORTA Ao Root diam: 3.20 cm Ao Asc diam:  2.90 cm MITRAL VALVE MV Area (PHT): 3.72 cm    SHUNTS MV Decel Time: 204 msec    Systemic VTI:  0.16 m MV E velocity: 66.00 cm/s  Systemic Diam: 2.10 cm MV A velocity: 90.80 cm/s MV E/A ratio:  0.73 Arvilla Meres MD Electronically signed by Arvilla Meres MD Signature Date/Time: 10/01/2023/3:06:21 PM    Final    CT CHEST W CONTRAST  Addendum Date: 10/01/2023   ADDENDUM REPORT: 10/01/2023 13:00 ADDENDUM: Upon re-review of CT scan chest with contrast performed yesterday (09/30/2023), the following findings are noted: There are several areas of nonocclusive filling defects in the segmental pulmonary artery branches to right upper lobe, right lower lobe and left upper lobe (marked with electronic arrow sign on series 2), compatible with acute small-to-moderate volume pulmonary embolism. No evidence of right heart strain or lung infarction. " Critical Value/emergent results were called by telephone at the time of interpretation on 10/01/2023 at 12:58 pm to provider Dr. Jonathon Bellows, Dayna Barker , who verbally acknowledged these results. Electronically Signed   By: Jules Schick M.D.   On: 10/01/2023 13:00   Result Date: 10/01/2023 CLINICAL DATA:  Colon cancer, staging. * Tracking Code: BO * EXAM: CT CHEST WITH CONTRAST TECHNIQUE: Multidetector CT imaging of the chest was performed during intravenous contrast administration. RADIATION DOSE REDUCTION: This exam was performed according to the departmental dose-optimization program which includes automated exposure control, adjustment of the mA and/or kV according to patient size and/or use of iterative reconstruction technique. CONTRAST:  75mL OMNIPAQUE IOHEXOL 300 MG/ML  SOLN COMPARISON:  None Available. FINDINGS: Cardiovascular:  No  pericardial effusion. No aortic aneurysm. Mediastinum/Nodes: There is a 1.4 x 1.9 cm hypoattenuating nodule in the inferior aspect of the isthmus, not well characterized on the current exam. The nodule meets the size criteria for follow-up nonemergent evaluation with thyroid ultrasound. No solid / cystic mediastinal masses. The esophagus is nondistended precluding optimal assessment. No axillary, mediastinal or hilar lymphadenopathy by size criteria. Lungs/Pleura: The central tracheo-bronchial tree is patent. There are patchy areas of linear, plate-like atelectasis and/or scarring throughout bilateral lungs. No mass or consolidation. No pleural effusion or pneumothorax. No suspicious lung nodules. Upper Abdomen: Visualized upper abdominal viscera within normal limits. Musculoskeletal: The visualized soft tissues of the chest wall are grossly unremarkable. No suspicious osseous lesions. There are mild multilevel degenerative changes in the visualized spine. IMPRESSION: 1. No evidence of metastatic disease in the chest. 2. 1.4 x 1.9 cm hypoattenuating nodule in the inferior aspect of the isthmus, not well characterized on the current exam. The nodule meets the size criteria for follow-up nonemergent evaluation with thyroid ultrasound. Electronically Signed: By: Timoteo Expose.D.  On: 09/30/2023 15:22   MR ABDOMEN W WO CONTRAST  Result Date: 09/28/2023 CLINICAL DATA:  9 mm pancreatic cystic lesion on CT. EXAM: MRI ABDOMEN WITHOUT AND WITH CONTRAST TECHNIQUE: Multiplanar multisequence MR imaging of the abdomen was performed both before and after the administration of intravenous contrast. CONTRAST:  5mL GADAVIST GADOBUTROL 1 MMOL/ML IV SOLN COMPARISON:  CT of 1 day prior. FINDINGS: Portions of exam are mild to moderately motion degraded. Lower chest: Mild cardiomegaly, without pericardial or pleural effusion. Hepatobiliary: Normal liver. Normal gallbladder, without biliary ductal dilatation. Pancreas: No main  pancreatic duct dilatation. Corresponding the CT abnormality, within the pancreatic body/tail junction, is a cystic lesion which measures up to 9 mm on 19/24. No suspicious postcontrast characteristics including on 42/19. No peripancreatic edema. Spleen:  Normal in size, without focal abnormality. Adrenals/Urinary Tract: Normal adrenal glands. Dominant lower pole right renal cyst at 6.3 cm does not warrant imaging follow-up. Normal left kidney. No hydronephrosis. Stomach/Bowel: Normal stomach and small bowel. Colonic obstruction as detailed on CT. Vascular/Lymphatic: Aortic atherosclerosis. No retroperitoneal or retrocrural adenopathy. Other:  No ascites. Musculoskeletal: No acute osseous abnormality. IMPRESSION: 1. Mild to moderately motion degraded exam. 2. 9 mm cystic lesion within the pancreatic body/tail junction with differential considerations of pseudocyst or indolent cystic neoplasm. Per consensus criteria, this warrants follow-up with pre and post contrast abdominal MRI at 1 year. This recommendation follows ACR consensus guidelines: Management of Incidental Pancreatic Cysts: A White Paper of the ACR Incidental Findings Committee. J Am Coll Radiol 2017;14:911-923. 3.  Aortic Atherosclerosis (ICD10-I70.0). 4. Colonic obstruction, as on CT. Electronically Signed   By: Jeronimo Greaves M.D.   On: 09/28/2023 08:52   MR 3D Recon At Scanner  Result Date: 09/28/2023 CLINICAL DATA:  9 mm pancreatic cystic lesion on CT. EXAM: MRI ABDOMEN WITHOUT AND WITH CONTRAST TECHNIQUE: Multiplanar multisequence MR imaging of the abdomen was performed both before and after the administration of intravenous contrast. CONTRAST:  5mL GADAVIST GADOBUTROL 1 MMOL/ML IV SOLN COMPARISON:  CT of 1 day prior. FINDINGS: Portions of exam are mild to moderately motion degraded. Lower chest: Mild cardiomegaly, without pericardial or pleural effusion. Hepatobiliary: Normal liver. Normal gallbladder, without biliary ductal dilatation.  Pancreas: No main pancreatic duct dilatation. Corresponding the CT abnormality, within the pancreatic body/tail junction, is a cystic lesion which measures up to 9 mm on 19/24. No suspicious postcontrast characteristics including on 42/19. No peripancreatic edema. Spleen:  Normal in size, without focal abnormality. Adrenals/Urinary Tract: Normal adrenal glands. Dominant lower pole right renal cyst at 6.3 cm does not warrant imaging follow-up. Normal left kidney. No hydronephrosis. Stomach/Bowel: Normal stomach and small bowel. Colonic obstruction as detailed on CT. Vascular/Lymphatic: Aortic atherosclerosis. No retroperitoneal or retrocrural adenopathy. Other:  No ascites. Musculoskeletal: No acute osseous abnormality. IMPRESSION: 1. Mild to moderately motion degraded exam. 2. 9 mm cystic lesion within the pancreatic body/tail junction with differential considerations of pseudocyst or indolent cystic neoplasm. Per consensus criteria, this warrants follow-up with pre and post contrast abdominal MRI at 1 year. This recommendation follows ACR consensus guidelines: Management of Incidental Pancreatic Cysts: A White Paper of the ACR Incidental Findings Committee. J Am Coll Radiol 2017;14:911-923. 3.  Aortic Atherosclerosis (ICD10-I70.0). 4. Colonic obstruction, as on CT. Electronically Signed   By: Jeronimo Greaves M.D.   On: 09/28/2023 08:52   CT ABDOMEN PELVIS W CONTRAST  Result Date: 09/27/2023 CLINICAL DATA:  Acute abdominal pain. EXAM: CT ABDOMEN AND PELVIS WITH CONTRAST TECHNIQUE: Multidetector CT imaging of the abdomen  and pelvis was performed using the standard protocol following bolus administration of intravenous contrast. RADIATION DOSE REDUCTION: This exam was performed according to the departmental dose-optimization program which includes automated exposure control, adjustment of the mA and/or kV according to patient size and/or use of iterative reconstruction technique. CONTRAST:  OMNIPAQUE IOHEXOL  300 MG/ML  SOLN COMPARISON:  CT abdomen and pelvis 09/20/2023 FINDINGS: Lower chest: No acute abnormality. Hepatobiliary: There is diffuse fatty infiltration of the liver. Gallbladder and bile ducts are within normal limits. Pancreas: There is a rounded hypodensity in the tail of the pancreas measuring 9 mm which appears unchanged. No pancreatic ductal dilatation or surrounding inflammatory changes. Spleen: Normal in size without focal abnormality. Adrenals/Urinary Tract: Mildly complex right renal cyst is again seen with thin peripheral septation. This measures 6.9 cm and appears unchanged. Otherwise, the bilateral kidneys, adrenal gland glands, and bladder are within normal limits. Stomach/Bowel: There is wall thickening of the short segment of the proximal sigmoid colon. Upstream to this level of the colon is dilated containing air-fluid levels in the large amount of stool. The appendix is not seen. Small bowel loops and stomach are decompressed. Vascular/Lymphatic: Aortic atherosclerosis. No enlarged abdominal or pelvic lymph nodes. Reproductive: Status post hysterectomy. No adnexal masses. Other: There is a small amount of fluid in the pelvis. There is no focal abdominal wall hernia. There is no free air. Musculoskeletal: Subcentimeter lucent lesion in the L1 vertebral body, indeterminate. No acute fractures are seen. IMPRESSION: 1. Wall thickening of the proximal sigmoid colon worrisome for neoplasm. Focal colitis not excluded. There is upstream colonic dilatation with air-fluid levels and large amount of stool compatible with obstruction. 2. Small amount of free fluid in the pelvis. 3. Fatty infiltration of the liver. 4. Stable 9 mm cystic lesion in the tail of the pancreas. This can be further evaluated with MRI. 5. Stable mildly complex right renal cyst. No follow-up imaging recommended. 6. Subcentimeter lucent lesion in the L1 vertebral body, indeterminate. Aortic Atherosclerosis (ICD10-I70.0).  Electronically Signed   By: Darliss Cheney M.D.   On: 09/27/2023 22:27   CT ABDOMEN PELVIS W CONTRAST  Result Date: 09/20/2023 CLINICAL DATA:  Severe lower abdominal pain. EXAM: CT ABDOMEN AND PELVIS WITH CONTRAST TECHNIQUE: Multidetector CT imaging of the abdomen and pelvis was performed using the standard protocol following bolus administration of intravenous contrast. RADIATION DOSE REDUCTION: This exam was performed according to the departmental dose-optimization program which includes automated exposure control, adjustment of the mA and/or kV according to patient size and/or use of iterative reconstruction technique. CONTRAST:  OMNIPAQUE IOHEXOL 300 MG/ML  SOLN COMPARISON:  01/19/2006. FINDINGS: Lower chest: Mild atelectasis or scarring at the lung bases. Hepatobiliary: No focal liver abnormality is seen. Fatty infiltration of the liver is noted. No gallstones, gallbladder wall thickening, or biliary dilatation. Pancreas: Unremarkable. No pancreatic ductal dilatation or surrounding inflammatory changes. Spleen: Normal in size without focal abnormality. Adrenals/Urinary Tract: The adrenal glands are within normal limits. The kidneys enhance symmetrically. A cyst is noted in the lower pole of the right kidney. Subcentimeter hypodensities are present in the left kidney which are too small to further characterize. A nonobstructive left renal calculus is seen. No hydroureteronephrosis bilaterally. Stomach/Bowel: Stomach is within normal limits. No bowel obstruction, free air, or pneumatosis. Scattered diverticula are noted along the colon. A moderate amount of retained stool is present in the colon. The appendix is not seen. Multiple loops of small bowel are noted in the mid left abdomen and  pelvis with wall thickening, mucosal enhancement, and surrounding fat stranding. There is associated wall thickening and fat stranding at the sigmoid colon. Vascular/Lymphatic: Aortic atherosclerosis. No enlarged  abdominal or pelvic lymph nodes. Reproductive: Status post hysterectomy. No adnexal masses. Other: A small amount of free fluid is noted in the pelvis and presacral space. Musculoskeletal: Degenerative changes are present in the thoracolumbar spine. There stable deformity of the iliac bone on the left. No acute osseous abnormality. IMPRESSION: 1. Bowel wall thickening, mucosal enhancement, and surrounding fat stranding involving loops of small bowel in the mid left abdomen and pelvis with associated involvement of the sigmoid colon, possible infectious or inflammatory enteritis/colitis. No bowel obstruction is seen. 2. Moderate amount of retained stool in the colon suggesting constipation. 3. Diverticulosis without diverticulitis. 4. Nonobstructive left renal calculus. 5. Hepatic steatosis. 6. Aortic atherosclerosis. Electronically Signed   By: Thornell Sartorius M.D.   On: 09/20/2023 21:52   DG Chest Port 1 View  Result Date: 09/20/2023 CLINICAL DATA:  Sepsis.  Fever. EXAM: PORTABLE CHEST 1 VIEW COMPARISON:  04/09/2016 FINDINGS: The cardiomediastinal contours are normal. The lungs are clear. Pulmonary vasculature is normal. No consolidation, pleural effusion, or pneumothorax. No acute osseous abnormalities are seen. IMPRESSION: No active disease. Electronically Signed   By: Narda Rutherford M.D.   On: 09/20/2023 20:39    Labs:  CBC: Recent Labs    10/01/23 0326 10/02/23 0408 10/03/23 0345 10/03/23 2124 10/04/23 0359 10/04/23 1239 10/04/23 1608 10/04/23 2021 10/05/23 0247  WBC 11.3* 13.7* 16.8*  --  19.4*  --   --   --   --   HGB 13.1 10.2* 8.6*   < > 6.8* 7.4* 7.5* 7.0* 7.8*  HCT 40.3 31.4* 25.8*   < > 21.2* 23.2* 23.4* 21.4* 24.0*  PLT 409* 314 325  --  263  --   --   --   --    < > = values in this interval not displayed.    COAGS: Recent Labs    09/20/23 1902 10/05/23 0247  INR 1.1 1.1  APTT  --  30    BMP: Recent Labs    10/02/23 1944 10/03/23 0345 10/04/23 0359  10/05/23 0247  NA 135 137 140 140  K 4.0 4.3 4.0 3.3*  CL 103 105 107 108  CO2 26 24 26 26   GLUCOSE 146* 176* 133* 143*  BUN 34* 30* 30* 30*  CALCIUM 7.8* 8.1* 8.5* 8.1*  CREATININE 0.68 0.59 0.58 0.75  GFRNONAA >60 >60 >60 >60    LIVER FUNCTION TESTS: Recent Labs    09/20/23 1838 09/27/23 2007  BILITOT 1.2 0.6  AST 31 23  ALT 48* 28  ALKPHOS 105 91  PROT 8.0 7.8  ALBUMIN 3.3* 3.7    TUMOR MARKERS: No results for input(s): "AFPTM", "CEA", "CA199", "CHROMGRNA" in the last 8760 hours.  Assessment and Plan: 82 y.o. female with past medical history of A-fib, cervical cancer, hypertension, anemia who is status post exploratory lap, Hartman's resection and colostomy and small bowel resection on 10/01/2023 for sigmoid stricture.  Recent imaging has revealed bilateral PE as well as hematoma in the pelvis posterior to suture line.  Patient has also had recent hematuria and well as projectile vomiting earlier this morning. Lower extremity venous Doppler yesterday revealed acute DVT involving left popliteal, left posterior tibial and left peroneal veins as well as acute intramuscular thrombosis involving the left gastrocnemius veins.  Patient previously on heparin drip for PE however that is now on  hold and was reversed last night.  Pt has  received a total of 4 units of packed red cells since last night.  Hemoglobin 7.8, creatinine normal.  PT/INR normal.  Request now received for IVC filter placement.  Latest imaging studies have been reviewed by Dr. Deanne Coffer.Risks and benefits discussed with the patient/granddaughter [name] including, but not limited to bleeding, infection, contrast induced renal failure, filter fracture or migration which can lead to emergency surgery or even death, strut penetration with damage or irritation to adjacent structures and caval thrombosis.  All of the patient's questions were answered, patient is agreeable to proceed. Consent signed and in chart.   Procedure scheduled for today.    Thank you for this interesting consult.  I greatly enjoyed meeting AFIFA NESSER and look forward to participating in their care.  A copy of this report was sent to the requesting provider on this date.  Electronically Signed: D. Jeananne Rama, PA-C 10/05/2023, 9:59 AM   I spent a total of  25 minutes   in face to face in clinical consultation, greater than 50% of which was counseling/coordinating care for inferior vena cava filter placement

## 2023-10-05 NOTE — Consult Note (Signed)
Consultation Note Date: 10/05/2023   Patient Name: Elizabeth Odom  DOB: [birth year]  MRN: [id number]  Age / Sex: 82 y.o., female   PCP: [name], FNP Referring Physician: Lanae Boast, MD  Reason for Consultation: Establishing goals of care     Chief Complaint/History of Present Illness:   Patient is an 82 yo female with a PMHx of Afib, RLS, HTN, cervical cancer, and anemia who was admitted on 09/27/23 for management abdominal pain. During hospitalization, patient was found to have a bowel obstruction for which she underwent Hartman's procedure and small bowel resection with surgical team on 10/21.  Postop course complicated by drop in hemoglobin; oncology consulted.  On imaging patient found to have pelvic hematoma posterior to surgical site/anastomosis site, acute DVT to left popliteal vein, left posterior tibial veins, and left peroneal veins consistent with acute intramuscular thrombus involving the left gastrocnemius veins.  Course further complicated by projectile vomiting overnight into 10/25 with refusal of NG tube placement.  Palliative medicine team consulted to assist with complex medical decision making.  Extensive review of EMR prior to presenting to bedside.  Also able to discuss care with surgical team PA and RN for medical updates.  Patient did agree to have NG tube placed this morning after discussion with her granddaughter.  Patient also having IVC filter placed by IR this morning.  Patient was at procedure when presented to bedside initially to see her.  Presented to bedside in afternoon as well. Patient had just returned from IVC filter placement and was sleeping. Patient's granddaughter at bedside. Able to introduce myself as a member of the palliative medicine team. Hoping patient's medical status will improve after allowing NGT placement this morning. Continuing to allow time for outcomes. Noted palliative medicine team will continue to follow along and engage in  conversation as appropraite.   Primary Diagnoses  Present on Admission:  Acute colitis  Past Medical History:  Diagnosis Date   A-fib (HCC) 04/09/2016   Acute head injury 04/09/2016   Cervical cancer (HCC)    Cervical spine fracture, initial encounter 04/09/2016   Essential hypertension    History of anemia    Multiple fractures of cervical spine (HCC) 04/09/2016   PAC (premature atrial contraction)    Restless leg    Syncope and collapse 04/09/2016   Social History   Socioeconomic History   Marital status: Widowed    Spouse name: Not on file   Number of children: 3   Years of education: schooling in [place]   Highest education level: Not on file  Occupational History   Not on file  Tobacco Use   Smoking status: Never   Smokeless tobacco: Never  Substance and Sexual Activity   Alcohol use: Never    Alcohol/week: 0.0 standard drinks of alcohol   Drug use: Never   Sexual activity: Not on file  Other Topics Concern   Not on file  Social History Narrative   Patient is originally from [place]. 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 Her emergency contact/medical decision maker is her granddaughter:  [name] [phone]).   Social Determinants of Health   Financial Resource Strain: Not on file  Food Insecurity: No Food Insecurity (09/28/2023)   Hunger Vital Sign    Worried About Running Out of Food in the Last Year: Never true    Ran Out of Food in the Last Year: Never true  Transportation Needs: No  Transportation Needs (09/28/2023)   PRAPARE - Administrator, Civil Service (Medical): No    Lack of Transportation (Non-Medical): No  Physical Activity: Not on file  Stress: Not on file  Social Connections: Not on file   Family History  Problem Relation Age of Onset   Other Mother        thrombosis   Cancer Sister    Diabetes Sister    Stroke Daughter    Thyroid disease Daughter     Scheduled Meds:  bisacodyl  10 mg Rectal Once   Chlorhexidine Gluconate Cloth  6 each Topical Q0600   insulin aspart  0-9 Units Subcutaneous Q4H   mupirocin ointment  1 Application Nasal BID   Continuous Infusions:  dextrose 5 % and 0.9 % NaCl 50 mL/hr at 10/05/23 1014   piperacillin-tazobactam (ZOSYN)  IV     potassium chloride     PRN Meds:.acetaminophen **OR** acetaminophen, HYDROmorphone (DILAUDID) injection, LORazepam, ondansetron **OR** ondansetron (ZOFRAN) IV, phenol, prochlorperazine Allergies  Allergen Reactions   Elemental Sulfur Anaphylaxis   CBC:    Component Value Date/Time   WBC 19.2 (H) 10/05/2023 1016   HGB 11.1 (L) 10/05/2023 1016   HCT 33.2 (L) 10/05/2023 1016   PLT 227 10/05/2023 1016   MCV 93.3 10/05/2023 1016   NEUTROABS 10.9 (H) 09/28/2023 0648   LYMPHSABS 1.4 09/28/2023 0648   MONOABS 0.9 09/28/2023 0648   EOSABS 0.1 09/28/2023 0648   BASOSABS 0.1 09/28/2023 0648   Comprehensive Metabolic Panel:    Component Value Date/Time   NA 140 10/05/2023 0247   K 3.3 (L) 10/05/2023 0247   CL 108 10/05/2023 0247   CO2 26 10/05/2023 0247   BUN 30 (H) 10/05/2023 0247   CREATININE 0.75 10/05/2023 0247   GLUCOSE 143 (H) 10/05/2023 0247   CALCIUM 8.1 (L) 10/05/2023 0247   AST 23 09/27/2023 2007   ALT 28 09/27/2023 2007   ALKPHOS 91 09/27/2023 2007   BILITOT 0.6 09/27/2023 2007   PROT 7.8 09/27/2023 2007   ALBUMIN 3.7 09/27/2023 2007    Physical Exam: Vital Signs: BP (!) 123/47 (BP Location: Right Arm)   Pulse 66   Temp 98.4 F (36.9 C) (Oral)   Resp 16   Ht 5\' 5"  (1.651 m)   Wt 62 kg   SpO2 97%   BMI 22.75 kg/m  SpO2: SpO2: 97 % O2 Device: O2 Device: Nasal Cannula O2 Flow Rate: O2 Flow Rate (L/min): 2 L/min Intake/output summary:  Intake/Output Summary (Last 24 hours) at 10/05/2023 1057 Last data filed at 10/05/2023 1014 Gross per 24 hour  Intake 1762.84 ml  Output 1388 ml  Net 374.84 ml   LBM: Last BM Date : 10/05/23 (from  ostomy) Baseline Weight: Weight: 56.2 kg Most recent weight: Weight: 62 kg  General: sleeping, elderly appearing, frail HENT: NGT in place Cardiovascular: RRR Respiratory: no increased work of breathing noted, not in respiratory distress Neuro: sleeping after procedure   Palliative Performance Scale: 10%              Additional Data Reviewed: Recent Labs    10/04/23 0359 10/04/23 1239 10/05/23 0247 10/05/23 1016  WBC 19.4*  --   --  19.2*  HGB 6.8*   < > 7.8* 11.1*  PLT 263  --   --  227  NA 140  --  140  --   BUN 30*  --  30*  --   CREATININE 0.58  --  0.75  --    < > =  values in this interval not displayed.    Imaging: CT HEMATURIA WORKUP CLINICAL DATA:  Hematuria.  EXAM: CT ABDOMEN AND PELVIS WITHOUT AND WITH CONTRAST  TECHNIQUE: Multidetector CT imaging of the abdomen and pelvis was performed following the standard protocol before and following the bolus administration of intravenous contrast.  RADIATION DOSE REDUCTION: This exam was performed according to the departmental dose-optimization program which includes automated exposure control, adjustment of the mA and/or kV according to patient size and/or use of iterative reconstruction technique.  CONTRAST:  OMNIPAQUE IOHEXOL 300 MG/ML  SOLN  COMPARISON:  Preop CT scan 09/27/2023 and older  FINDINGS: Lower chest: Breathing motion at the bases. Tiny right effusion and trace left. Mild basilar atelectasis. Known pulmonary emboli again identified.  Hepatobiliary: No focal liver abnormality is seen. No gallstones, gallbladder wall thickening, or biliary dilatation. Patent portal vein.  Pancreas: Unremarkable. No pancreatic ductal dilatation or surrounding inflammatory changes.  Spleen: Normal in size without focal abnormality.  Adrenals/Urinary Tract: Adrenal glands are preserved. There is a exophytic cyst from the lower pole of the right kidney with a thin septation with calcification measuring  6.7 x 6.5 cm in the axial plane. Hounsfield units of 7. No aggressive features. Bosniak 2 lesion. No specific imaging follow-up. No enhancing renal mass. There is a punctate nonobstructing stone along the lower pole of the left kidney. Small punctate hyperdense focus measuring 3 mm along the upper pole left kidney on series 5, image 59 of the precontrast coronal. Slightly brush like appearance to the calices consistent with benign tubular ectasia. The collecting systems are nondilated. The ureters have normal course and caliber down to the bladder. No urothelial thickening, filling defect or dilatation. Grossly preserved contours of the urinary bladder. There is a small bubble of air in the lumen of the bladder. Please correlate for etiology including any recent instrumentation. Slight areas of wall thickening of the bladder but nonspecific with the adjacent process.  Stomach/Bowel: Stomach is fluid-filled. The duodenal is dilated with air-fluid levels measuring up to 3.8 cm. There is also a fluid-filled dilated jejunum in the left midabdomen and pelvis. These extend down to a small bowel suture line in the central pelvis above the bladder as seen on series 7, image 65. No oral contrast. In this location is some air with mixed intermediate density material along the bowel. This could be luminal debris although based on appearance this could be also hematoma. This loop is dilated up to 7.1 cm focally in the axial plane. Also the suture line is somewhat discontiguous in some locations as seen on coronal series 9, image 31. There is also a significant complex fluid collection posterior to this area in the pelvis anterior to the rectum above the bladder on series 7, image 72 measuring 8.9 cm. Again this could be hematoma. 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 Small-bowel resection and anastomosis. The distal ileum is more normal  caliber, relatively decompressed  The large bowel demonstrates some scattered stool with diverticula. Once again there has been distal colonic resection with a left lower quadrant colostomy. The loop extending to the colostomy is slightly with wall thickening. There is rectosigmoid stump with some luminal debris.  Vascular/Lymphatic: Aortic atherosclerosis. No enlarged abdominal or pelvic lymph nodes.  Reproductive: Status post hysterectomy. No adnexal masses.  Other: There is open anterior abdominal wall pelvic wound in the midline. Anasarca. Mesenteric stranding. Smaller areas of trace fluid throughout the mesentery. Presacral and perirectal fat  edema. Few bubbles of free air which could be postoperative.  Musculoskeletal: Degenerative changes along the spine.  IMPRESSION: Surgical changes identified since the prior examination consistent with the provided history of partial left colectomy, colostomy with a Hartmann's pouch procedure and associated small bowel resection with anastomosis. Few scattered areas of free air are as per surgery.  The suture line of the small bowel in the pelvis is a dilated loop of bowel with mixed density including some intermediate areas in the lumen. This bowel loop is dilated to 7.1 cm in the proximal jejunum and duodenal is dilated with fluid. This could be a focal developing obstruction. The more distal small bowel is nondilated.  In addition there is significant adjacent presumed hematoma in the pelvis posterior to the suture line and on the coronal images the suture line is somewhat indistinct along its course. With level of dilatation, the surrounding fluid, gas and hematoma, please correlate for any clinical evidence of a leak or dehiscence versus expected postop change.  Slight wall thickening along the large bowel loop extending out to the colostomy along the left side. Diffuse colonic diverticula.  Tiny pleural effusions,  right-greater-than-left.  Punctate nonobstructing lower pole left-sided renal stone. Bosniak 2 right-sided renal cystic lesion. No enhancing renal mass or separate collecting system dilatation.  Slight wall thickening of the urinary bladder but this could relate to the adjacent process. Small bubble of air in the bladder lumen could be related to previous instrumentation but please correlate with history.  Known pulmonary emboli. These are somewhat poorly seen on this abdomen and pelvis hematuria workup exam  Critical Value/emergent results were called by telephone at the time of interpretation on 10/04/2023 at 3:04 pm to provider [facility] , who verbally acknowledged these results and will follow up with the surgery team.  Electronically Signed   By: Karen Kays M.D.   On: 10/04/2023 18:09 VAS [place] LOWER EXTREMITY VENOUS (DVT)  Lower Venous DVT Study  Patient Name:  Elizabeth Odom  Date of Exam:   10/04/2023 Medical Rec #: [id number]       Accession #:    [id number] Date of Birth: [birth year]      Patient Gender: F Patient Age:   89 years Exam Location:  [facility] Procedure:      VAS [place] LOWER EXTREMITY VENOUS (DVT) Referring Phys: RAMESH KC  --------------------------------------------------------------------------------   Indications: Pulmonary embolism.   Risk Factors: Confirmed PE. Anticoagulation: Heparin. Comparison Study: No prior studies.  Performing Technologist: Chanda Busing RVT    Examination Guidelines: A complete evaluation includes B-mode imaging, spectral Doppler, color Doppler, and power Doppler as needed of all accessible portions of each vessel. Bilateral testing is considered an integral part of a complete examination. Limited examinations for reoccurring indications may be performed as noted. The reflux portion of the exam is performed with the patient in reverse Trendelenburg.      +---------+---------------+---------+-----------+----------+--------------+ RIGHT    CompressibilityPhasicitySpontaneityPropertiesThrombus Aging +---------+---------------+---------+-----------+----------+--------------+ CFV      Full           Yes      Yes                                 +---------+---------------+---------+-----------+----------+--------------+ SFJ      Full                                                        +---------+---------------+---------+-----------+----------+--------------+  FV Prox  Full                                                        +---------+---------------+---------+-----------+----------+--------------+ FV Mid   Full                                                        +---------+---------------+---------+-----------+----------+--------------+ FV DistalFull                                                        +---------+---------------+---------+-----------+----------+--------------+ PFV      Full                                                        +---------+---------------+---------+-----------+----------+--------------+ POP      Full           Yes      Yes                                 +---------+---------------+---------+-----------+----------+--------------+ PTV      Full                                                        +---------+---------------+---------+-----------+----------+--------------+ PERO     Full                                                        +---------+---------------+---------+-----------+----------+--------------+        +---------+---------------+---------+-----------+----------+--------------+ LEFT     CompressibilityPhasicitySpontaneityPropertiesThrombus Aging +---------+---------------+---------+-----------+----------+--------------+ CFV      Full           Yes      Yes                                  +---------+---------------+---------+-----------+----------+--------------+ SFJ      Full                                                        +---------+---------------+---------+-----------+----------+--------------+ FV Prox  Full                                                        +---------+---------------+---------+-----------+----------+--------------+  FV Mid   Full                                                        +---------+---------------+---------+-----------+----------+--------------+ FV DistalFull                                                        +---------+---------------+---------+-----------+----------+--------------+ PFV      Full                                                        +---------+---------------+---------+-----------+----------+--------------+ POP      Partial        Yes      Yes                  Acute          +---------+---------------+---------+-----------+----------+--------------+ PTV      Partial                                      Acute          +---------+---------------+---------+-----------+----------+--------------+ PERO     Partial                                      Acute          +---------+---------------+---------+-----------+----------+--------------+ Gastroc  None                                         Acute          +---------+---------------+---------+-----------+----------+--------------+             Summary: RIGHT:  - There is no evidence of deep vein thrombosis in the lower extremity.   - No cystic structure found in the popliteal fossa.   LEFT: - Findings consistent with acute deep vein thrombosis involving the left popliteal vein, left posterior tibial veins, and left peroneal veins. Findings consistent with acute intramuscular thrombosis involving the left gastrocnemius veins.  - No cystic structure found in the popliteal fossa.    *See  table(s) above for measurements and observations.  Electronically signed by Heath Lark on 10/04/2023 at 5:38:11 PM.      Final   DG Abd Portable 1V CLINICAL DATA:  Nausea and vomiting.  EXAM: PORTABLE ABDOMEN - 1 VIEW  COMPARISON:  09/27/2019 for  FINDINGS: The bowel gas pattern has improved from the previous exam. There is been interval decrease in previously noted colonic distension. Within the left lower quadrant of the abdomen there are several dilated loops of small bowel which measure up to 3 mm, similar to the previous CT.  IMPRESSION: 1. Interval improvement in diffuse colonic distension. 2. Persistent mild dilatation of small bowel loops in the left lower  quadrant of the abdomen.  Electronically Signed   By: Signa Kell M.D.   On: 10/04/2023 11:00    I personally reviewed recent imaging.   Palliative Care Assessment and Plan Summary of Established Goals of Care and Medical Treatment Preferences   Patient is an 82 yo female with a PMHx of Afib, RLS, HTN, cervical cancer, and anemia who was admitted on 09/27/23 for management abdominal pain. During hospitalization, patient was found to have a bowel obstruction for which she underwent Hartman's procedure and small bowel resection with surgical team on 10/21.  Postop course complicated by drop in hemoglobin; oncology consulted.  On imaging patient found to have pelvic hematoma posterior to surgical site/anastomosis site, acute DVT to left popliteal vein, left posterior tibial veins, and left peroneal veins consistent with acute intramuscular thrombus involving the left gastrocnemius veins.  Course further complicated by projectile vomiting overnight into 10/25 with refusal of NG tube placement.  Palliative medicine team consulted to assist with complex medical decision making.  # Complex medical decision making/goals of care  - Patient sleeping when seen after IVF filter placement. Introduced self and PMT to  granddaughter at bedside. Patient currently continuing with appropraite medical interventions to determine if will improve overall. Patient is at risk for decompensation due to multiple acute illnesses. Allowing time for outcomes. PMT will continue to follow along with patient's medical journey and engage in conversation as appropraite.   -  Code Status: Limited: Do not attempt resuscitation (DNR) -DNR-LIMITED -Do Not Intubate/DNI    # Psycho-social/Spiritual Support:  - Support System: Granddaughter, daughter, son  # Discharge Planning:  To Be Determined  Thank you for allowing the palliative care team to participate in the care [name].  Alvester Morin, DO Palliative Care Provider PMT # [phone]  If patient remains symptomatic despite maximum doses, please call PMT at [phone] between 0700 and 1900. Outside of these hours, please call attending, as PMT does not have night coverage.  Personally spent 63 minutes in patient care including extensive chart review (labs, imaging, progress/consult notes, vital signs), medically appropraite exam, discussed with treatment team, education to patient, family, and staff, documenting clinical information, medication review and management, coordination of care, and available advanced directive documents.   *Please note that this is a verbal dictation therefore any spelling or grammatical errors are due to the "[employer]agon [employer] One" system interpretation.

## 2023-10-05 NOTE — Consult Note (Addendum)
WOC Nurse ostomy follow up Surgical team following for assessment and plan of care for abd wound.  Pt is currently in ICU and did not watch pouch change or assist or ask questions.  She will require total assistance with pouch application and emptying after discharge.  Stoma type/location: Stoma is red and viable, 1 1/2 inches, slightly above skin level.  Peristomal assessment: intact Output: 30cc semiformed brown stool Ostomy pouching: 1pc; applied barrier ring and one piece flat pouch. WOC team will follow next week to determine if patient is ready to be involved in ostomy teaching sessions.  2 sets of each supply left at the bedside.  Use barrier rings, Hart [place] # [phone] and one piece pouches, Hart [place] # [phone] Enrolled patient in [employer] Discharge program: Yes, previously Thank-you,  Cammie Mcgee MSN, RN, CWOCN, [place], CNS [phone]

## 2023-10-06 DIAGNOSIS — Z7189 Other specified counseling: Secondary | ICD-10-CM | POA: Diagnosis not present

## 2023-10-06 DIAGNOSIS — N9489 Other specified conditions associated with female genital organs and menstrual cycle: Secondary | ICD-10-CM

## 2023-10-06 DIAGNOSIS — D62 Acute posthemorrhagic anemia: Secondary | ICD-10-CM | POA: Diagnosis not present

## 2023-10-06 DIAGNOSIS — K567 Ileus, unspecified: Secondary | ICD-10-CM

## 2023-10-06 DIAGNOSIS — K529 Noninfective gastroenteritis and colitis, unspecified: Secondary | ICD-10-CM | POA: Diagnosis not present

## 2023-10-06 DIAGNOSIS — K56609 Unspecified intestinal obstruction, unspecified as to partial versus complete obstruction: Secondary | ICD-10-CM | POA: Diagnosis not present

## 2023-10-06 DIAGNOSIS — I824Y2 Acute embolism and thrombosis of unspecified deep veins of left proximal lower extremity: Secondary | ICD-10-CM

## 2023-10-06 LAB — CBC
HCT: 28.8 % — ABNORMAL LOW (ref 36.0–46.0)
Hemoglobin: 9.6 g/dL — ABNORMAL LOW (ref 12.0–15.0)
MCH: 31.1 pg (ref 26.0–34.0)
MCHC: 33.3 g/dL (ref 30.0–36.0)
MCV: 93.2 fL (ref 80.0–100.0)
Platelets: 212 10*3/uL (ref 150–400)
RBC: 3.09 MIL/uL — ABNORMAL LOW (ref 3.87–5.11)
RDW: 15.9 % — ABNORMAL HIGH (ref 11.5–15.5)
WBC: 16.6 10*3/uL — ABNORMAL HIGH (ref 4.0–10.5)
nRBC: 0 % (ref 0.0–0.2)

## 2023-10-06 LAB — GLUCOSE, CAPILLARY
Glucose-Capillary: 122 mg/dL — ABNORMAL HIGH (ref 70–99)
Glucose-Capillary: 92 mg/dL (ref 70–99)
Glucose-Capillary: 103 mg/dL — ABNORMAL HIGH (ref 70–99)
Glucose-Capillary: 98 mg/dL (ref 70–99)
Glucose-Capillary: 91 mg/dL (ref 70–99)
Glucose-Capillary: 95 mg/dL (ref 70–99)

## 2023-10-06 LAB — BASIC METABOLIC PANEL
Anion gap: 8 (ref 5–15)
BUN: 27 mg/dL — ABNORMAL HIGH (ref 8–23)
CO2: 28 mmol/L (ref 22–32)
Calcium: 8.4 mg/dL — ABNORMAL LOW (ref 8.9–10.3)
Chloride: 107 mmol/L (ref 98–111)
Creatinine, Ser: 0.66 mg/dL (ref 0.44–1.00)
GFR, Estimated: >60 mL/min (ref >60)
Glucose, Bld: 131 mg/dL — ABNORMAL HIGH (ref 70–99)
Potassium: 3.2 mmol/L — ABNORMAL LOW (ref 3.5–5.1)
Sodium: 143 mmol/L (ref 135–145)

## 2023-10-06 MED ORDER — CHLORHEXIDINE GLUCONATE CLOTH 2 % EX PADS
6.0000 | MEDICATED_PAD | Freq: Every day | CUTANEOUS | Status: DC
Start: 2023-10-06 — End: 2023-11-09
  Administered 2023-10-06 – 2023-11-08 (×35): 6 via TOPICAL

## 2023-10-06 MED ORDER — POTASSIUM CHLORIDE 10 MEQ/100ML IV SOLN
INTRAVENOUS | Status: AC
  Administered 2023-10-06: 10 meq via INTRAVENOUS
  Filled 2023-10-06: qty 100

## 2023-10-06 MED ORDER — POTASSIUM CHLORIDE 10 MEQ/100ML IV SOLN
10.0000 meq | INTRAVENOUS | Status: AC
  Administered 2023-10-06 (×2): 10 meq via INTRAVENOUS
  Filled 2023-10-06 (×3): qty 100

## 2023-10-06 NOTE — Plan of Care (Signed)
  Problem: Metabolic: Goal: Ability to maintain appropriate glucose levels will improve Outcome: Progressing   Problem: Education: Goal: Knowledge of General Education information will improve Description: Including pain rating scale, medication(s)/side effects and non-pharmacologic comfort measures Outcome: Progressing   Problem: Fluid Volume: Goal: Ability to maintain a balanced intake and output will improve Outcome: Not Progressing   Problem: Nutritional: Goal: Maintenance of adequate nutrition will improve Outcome: Not Progressing Goal: Progress toward achieving an optimal weight will improve Outcome: Not Progressing   Problem: Skin Integrity: Goal: Risk for impaired skin integrity will decrease Outcome: Not Progressing

## 2023-10-06 NOTE — Plan of Care (Signed)
  Problem: Health Behavior/Discharge Planning: Goal: Ability to manage health-related needs will improve Outcome: Progressing   Problem: Metabolic: Goal: Ability to maintain appropriate glucose levels will improve Outcome: Progressing   Problem: Clinical Measurements: Goal: Ability to maintain clinical measurements within normal limits will improve Outcome: Progressing Goal: Will remain free from infection Outcome: Progressing Goal: Respiratory complications will improve Outcome: Progressing   Problem: Coping: Goal: Level of anxiety will decrease Outcome: Progressing   Problem: Pain Managment: Goal: General experience of comfort will improve Outcome: Progressing

## 2023-10-06 NOTE — Progress Notes (Signed)
PROGRESS NOTE  Elizabeth Odom:[id number] DOB: 1941-05-28   PCP: [name], FNP  Patient is from: Home  DOA: 09/27/2023 LOS: 9  Chief complaints Chief Complaint  Patient presents with   Abdominal Pain     Brief Narrative / Interim history: 82 year old F with PMH of A-fib, RLS and anemia presented to ED on 10/17 with recurrent abdominal pain, nausea, emesis, diarrhea and intolerance of oral intake.  Patient was seen in ED on 10/10 for abdominal pain, decreased appetite and fever and discharged on Augmentin for possible colitis as noted on CT abdomen and pelvis.  However, she has recurrence of his symptoms after interval improvement.  In ED, vital stable.  She has leukocytosis to 15.5 with left shift.  CT showed proximal sigmoid colon wall thickening with upstream colonic dilation with air-fluid levels and large amount of stool compatible with obstruction.  There is also a 9 mm cystic lesion in the tail of pancreas, and subcentimeter lucent lesion in L1 vertebral body.  Evaluated by GI.  She refused colonoscopy but agreed to sigmoidoscopy that was done on 10/19 that showed very tight stricture versus mass in rectosigmoid that could not be transversed even with the endoscope.  General surgery consulted and she underwent ex lap with small bowel resection, Hartman's resection and colostomy on 10/21.  Hospital course complicated by acute PE and LLE DVT for which she was started on heparin.  Unfortunately, she developed ABLA with hematoma and pelvis and around small bowel anastomosis as well as gross hematuria.  Hemoglobin reached nadir at 6.8.  Anticoagulation discontinued.  Transfused a total of 4 units.  Transferred to stepdown.  PCCM consulted.  CODE STATUS changed to DNR/DNI.  She had IVC filter placed on 10/25 by IR.Marland Kitchen   Subjective: Seen and examined earlier this morning.  No major events overnight of this morning.  She reports "some" pain after bed bath.  NG tube in place.  Feels  her mouth is dry.  Likes to get some ice chips.  Denies chest pain or shortness of breath.  Objective: Vitals:   10/06/23 0746 10/06/23 0800 10/06/23 0838 10/06/23 1055  BP:  (!) 134/49    Pulse: (!) 38   79  Resp: 16 19  19   Temp:   97.9 F (36.6 C)   TempSrc:   Oral   SpO2: 97% 98%  96%  Weight: 62.8 kg     Height:        Examination:  GENERAL: No apparent distress.  Nontoxic. HEENT: Somewhat dry mucous membrane.  Vision and hearing grossly intact.  NGT. NECK: Supple.  No apparent JVD.  RESP:  No IWOB.  Fair aeration bilaterally. CVS: Irregular rhythm.  Normal rate.  Heart sounds normal.  ABD/GI/GU: BS+. Abd soft.  Ostomy bag in PT.  Laparotomy wound dressing DCI. MSK/EXT:  Moves extremities. No apparent deformity. No edema.  SKIN: no apparent skin lesion or wound NEURO: Awake, alert and oriented appropriately.  No apparent focal neuro deficit. PSYCH: Calm. Normal affect.   Procedures:  10/19-sigmoidoscopy 10/21-ex lap with small bowel resection, Hartman's and colostomy 10/25-IVC filter  Microbiology summarized: Surgical MRSA PCR screen positive.  Assessment and plan: Principal Problem:   Large bowel obstruction (HCC) Active Problems:   Acute colitis   Left leg DVT (HCC)   ABLA (acute blood loss anemia)   Palliative care encounter   Counseling and coordination of care   Need for emotional support   Goals of care, counseling/discussion  Large bowel  obstruction -10/17-CT with proximal sigmoid colon thickening -10/19-sigmoidoscopy with very tight stricture versus mass in rectosigmoid colon -10/21-s/p s/p ex lap Hartman's resectio, small bowel resection and colostomy.   -Biopsy negative for malignancy but suggested inflammation. -General Surgery managing.-WTD dressing for wound, hold anticoagulation, n.p.o., IVF, NGT -On IV Zosyn. -Pain control  Postoperative ileus vs partial SBO-felt to be exacerbated by intra-abdominal bleeding secondary to  anticoagulation. -NPO, NGT, IVF  Postoperative ABLA due to pelvic and perianastomotic hematoma and hematuria: Hgb reached nadir at 6.8 on 10/24.  Transfused 2 units on 10/24 and additional 2 units on 10/25.  H&H seems to be stable Recent Labs    10/02/23 0408 10/03/23 0345 10/03/23 2124 10/04/23 0359 10/04/23 1239 10/04/23 1608 10/04/23 2021 10/05/23 0247 10/05/23 1016 10/06/23 0312  HGB 10.2* 8.6* 7.0* 6.8* 7.4* 7.5* 7.0* 7.8* 11.1* 9.6*  -Monitor -Anticoagulation on hold  Acute PE w/o right heart strain/acute left leg DVT: Noted on CT chest on 10/20.  No heart strain -Anticoagulation discontinued due to ABLA/hematoma/hematuria -S/p IVC filter for LLE DVT on 10/25  Gross hematuria: CT hematuria protocol did not show acute urinary process. -Anticoagulation on hold. -Urology on board and ordered 24 urine collection   Pancreatic tail cyst: 9 mm pancreatic tail cyst noted on CT abdomen and pelvis. -Pre and post current abdominal MRI in 1 year recommended   Nodule in the inferior aspect of the isthmus  -will need thyroid ultrasound not emergently.   Chronic HFpEF: TTE with EF 77 0% G1-DD.   -Continue IV fluid while n.p.o.  -Strict intake and output, daily weights renal functions and electrolyte  Paroxysmal  atrial fibrillation: Rate controlled.  Not on meds PTA. -Telemetry monitoring -Optimize electrolytes  Hyperglycemia/prediabetes: A1c 5.8%. -Monitor with daily labs  Hyponatremia: Resolved  Hypokalemia: -Monitor replenish as appropriate   Leukocytosis: Seems persistent.  Likely due to #1 -Continue monitoring  Goals of care: CODE STATUS changed to DNR/DNI after discussion between PCCM, patient and family on 10/24. -Palliative medicine on board  Body mass index is 23.04 kg/m.          DVT prophylaxis:  SCD to right leg  Code Status: DNR/DNI Family Communication: None at bedside Level of care: Stepdown Status is: Inpatient Remains inpatient appropriate  because: Colonic obstruction, ileus, ABLA, VTE   Final disposition: TBD Consultants:  Gastroenterology General Surgery PCCM Palliative medicine  55 minutes with more than 50% spent in reviewing records, counseling patient/family and coordinating care.   Sch Meds:  Scheduled Meds:  bisacodyl  10 mg Rectal Once   Chlorhexidine Gluconate Cloth  6 each Topical Daily   insulin aspart  0-9 Units Subcutaneous Q4H   Continuous Infusions:  piperacillin-tazobactam (ZOSYN)  IV Stopped (10/06/23 0912)   PRN Meds:.acetaminophen **OR** acetaminophen, HYDROmorphone (DILAUDID) injection, liver oil-zinc oxide, LORazepam, ondansetron **OR** ondansetron (ZOFRAN) IV, phenol, prochlorperazine  Antimicrobials: Anti-infectives (From admission, onward)    Start     Dose/Rate Route Frequency Ordered Stop   10/05/23 1200  piperacillin-tazobactam (ZOSYN) IVPB 3.375 g        3.375 g 12.5 mL/hr over 240 Minutes Intravenous Every 8 hours 10/05/23 0721 10/09/23 1959   10/04/23 2000  piperacillin-tazobactam (ZOSYN) IVPB 3.375 g  Status:  Discontinued        3.375 g 12.5 mL/hr over 240 Minutes Intravenous Every 8 hours 10/04/23 1827 10/05/23 0721   09/27/23 2315  piperacillin-tazobactam (ZOSYN) IVPB 3.375 g  Status:  Discontinued        3.375 g 12.5 mL/hr over  240 Minutes Intravenous Every 8 hours 09/27/23 2305 10/02/23 1106        I have personally reviewed the following labs and images: CBC: Recent Labs  Lab 10/02/23 0408 10/03/23 0345 10/03/23 2124 10/04/23 0359 10/04/23 1239 10/04/23 1608 10/04/23 2021 10/05/23 0247 10/05/23 1016 10/06/23 0312  WBC 13.7* 16.8*  --  19.4*  --   --   --   --  19.2* 16.6*  HGB 10.2* 8.6*   < > 6.8*   < > 7.5* 7.0* 7.8* 11.1* 9.6*  HCT 31.4* 25.8*   < > 21.2*   < > 23.4* 21.4* 24.0* 33.2* 28.8*  MCV 94.6 94.5  --  96.4  --   --   --   --  93.3 93.2  PLT 314 325  --  263  --   --   --   --  227 212   < > = values in this interval not displayed.   BMP  &GFR Recent Labs  Lab 10/02/23 1944 10/03/23 0345 10/04/23 0359 10/05/23 0247 10/06/23 0312  NA 135 137 140 140 143  K 4.0 4.3 4.0 3.3* 3.2*  CL 103 105 107 108 107  CO2 26 24 26 26 28   GLUCOSE 146* 176* 133* 143* 131*  BUN 34* 30* 30* 30* 27*  CREATININE 0.68 0.59 0.58 0.75 0.66  CALCIUM 7.8* 8.1* 8.5* 8.1* 8.4*  MG  --  1.8  --  2.2  --   PHOS  --  2.0*  --  2.5  --    Estimated Creatinine Clearance: 48.8 mL/min (by C-G formula based on SCr of 0.66 mg/dL). Liver & Pancreas: No results for input(s): "AST", "ALT", "ALKPHOS", "BILITOT", "PROT", "ALBUMIN" in the last 168 hours. No results for input(s): "LIPASE", "AMYLASE" in the last 168 hours. No results for input(s): "AMMONIA" in the last 168 hours. Diabetic: No results for input(s): "HGBA1C" in the last 72 hours. Recent Labs  Lab 10/05/23 1615 10/05/23 2013 10/05/23 2351 10/06/23 0448 10/06/23 0810  GLUCAP 98 119* 105* 122* 92   Cardiac Enzymes: No results for input(s): "CKTOTAL", "CKMB", "CKMBINDEX", "TROPONINI" in the last 168 hours. No results for input(s): "PROBNP" in the last 8760 hours. Coagulation Profile: Recent Labs  Lab 10/05/23 0247  INR 1.1   Thyroid Function Tests: No results for input(s): "TSH", "T4TOTAL", "FREET4", "T3FREE", "THYROIDAB" in the last 72 hours. Lipid Profile: No results for input(s): "CHOL", "HDL", "LDLCALC", "TRIG", "CHOLHDL", "LDLDIRECT" in the last 72 hours. Anemia Panel: Recent Labs    10/04/23 1239  VITAMINB12 3,343*  FOLATE 11.2  FERRITIN 166  TIBC 228*  IRON 22*  RETICCTPCT 4.4*   Urine analysis:    Component Value Date/Time   COLORURINE BROWN (A) 10/04/2023 0303   APPEARANCEUR TURBID (A) 10/04/2023 0303   LABSPEC >1.030 (H) 10/04/2023 0303   PHURINE 6.5 10/04/2023 0303   GLUCOSEU NEGATIVE 10/04/2023 0303   HGBUR LARGE (A) 10/04/2023 0303   BILIRUBINUR SMALL (A) 10/04/2023 0303   KETONESUR NEGATIVE 10/04/2023 0303   PROTEINUR >300 (A) 10/04/2023 0303    UROBILINOGEN 0.2 06/20/2009 1445   NITRITE POSITIVE (A) 10/04/2023 0303   LEUKOCYTESUR TRACE (A) 10/04/2023 0303   Sepsis Labs: Invalid input(s): "PROCALCITONIN", "LACTICIDVEN"  Microbiology: Recent Results (from the past 240 hour(s))  Surgical pcr screen     Status: Abnormal   Collection Time: 10/01/23  4:19 AM   Specimen: Nasal Mucosa; Nasal Swab  Result Value Ref Range Status   MRSA, PCR POSITIVE (A) NEGATIVE Final  Comment: RESULT CALLED TO, READ BACK BY AND VERIFIED WITH: NGUYEN, T. RN AT [phone] ON 10/01/2023 BY MECIAL J.    Staphylococcus aureus POSITIVE (A) NEGATIVE Final    Comment: (NOTE) The Xpert SA Assay (FDA approved for NASAL specimens in patients 33 years of age and older), is one component of a comprehensive surveillance program. It is not intended to diagnose infection nor to guide or monitor treatment. Performed at [facility], [address], [place], [place] [address]     Radiology Studies: IR IVC FILTER PLMT / S&I Lenise Arena GUID/MOD SED  Result Date: 10/05/2023 CLINICAL DATA:  Lower extremity DVT, pulmonary emboli. Postoperative hemorrhage, a relative contraindication to anticoagulation. Caval filtration requested. EXAM: INFERIOR VENACAVOGRAM IVC FILTER PLACEMENT UNDER FLUOROSCOPY FLUOROSCOPY: Radiation Exposure Index (as provided by the fluoroscopic device): 25 MGy air Kerma TECHNIQUE: Patency of the right IJ vein was confirmed with ultrasound with image documentation. An appropriate skin site was determined. Skin site was marked, prepped with chlorhexidine, and draped using maximum barrier technique. The region was infiltrated locally with 1% lidocaine. Intravenous Fentanyl and Versed 1mg  were administered as conscious sedation during continuous monitoring of the patient's level of consciousness and physiological / cardiorespiratory status by the radiology RN, with a total moderate sedation time of 14 minutes. Under real-time ultrasound  guidance, the right IJ vein was accessed with a 21 gauge micropuncture needle; the needle tip within the vein was confirmed with ultrasound image documentation. The needle was exchanged over a 018 guidewire for a transitional dilator, which allow advancement of the [facility] wire into the IVC. A long 6 French vascular sheath was placed for inferior venacavography. This demonstrated no caval thrombus. Renal vein inflows were evident. The retrievable Denali IVC filter was advanced through the sheath and successfully deployed under fluoroscopy at the L3 level. Followup cavagram demonstrates stable filter position and no evident complication. The sheath was removed and hemostasis achieved at the site. No immediate complication. IMPRESSION: 1. Normal IVC. No thrombus or significant anatomic variation. 2. Technically successful infrarenal IVC filter placement. This is a retrievable model. PLAN: This IVC filter is potentially retrievable. The patient will be assessed for filter retrieval by Interventional Radiology in approximately 8-12 weeks. Further recommendations regarding filter retrieval, continued surveillance or declaration of device permanence, will be made at that time. Electronically Signed   By: Corlis Leak M.D.   On: 10/05/2023 13:43      Arch Methot T. Emersynn Deatley [facility]  If 7PM-7AM, please contact night-coverage [email or web address] 10/06/2023, 11:52 AM

## 2023-10-06 NOTE — Progress Notes (Signed)
5 Days Post-Op   Subjective/Chief Complaint: Patient feels better with NG tube in place.  Nausea improved IVC filter placed yesterday Hgb down to 9.6 from 11.1 yesterday    Objective: Vital signs in last 24 hours: Temp:  [98.2 F (36.8 C)-98.8 F (37.1 C)] 98.2 F (36.8 C) (10/25 2351) Pulse Rate:  [38-85] 38 (10/26 0746) Resp:  [15-29] 19 (10/26 0800) BP: (110-142)/(46-91) 134/49 (10/26 0800) SpO2:  [91 %-99 %] 98 % (10/26 0800) Weight:  [62.8 kg] 62.8 kg (10/26 0746) Last BM Date : 10/05/23 (from ostomy)  Intake/Output from previous day: 10/25 0701 - 10/26 0700 In: 1067.4 [I.V.:438.2; Blood:348.5; IV Piggyback:280.8] Out: 1216 [Urine:585; Emesis/NG output:601; Stool:30] Intake/Output this shift: No intake/output data recorded.  WDWN in NAD Incision - packed; clean; no granulation tissue yet Ostomy - gas in bag with minimal soft brown stool  Lab Results:  Recent Labs    10/05/23 1016 10/06/23 0312  WBC 19.2* 16.6*  HGB 11.1* 9.6*  HCT 33.2* 28.8*  PLT 227 212   BMET Recent Labs    10/05/23 0247 10/06/23 0312  NA 140 143  K 3.3* 3.2*  CL 108 107  CO2 26 28  GLUCOSE 143* 131*  BUN 30* 27*  CREATININE 0.75 0.66  CALCIUM 8.1* 8.4*   PT/INR Recent Labs    10/05/23 0247  LABPROT 14.3  INR 1.1   ABG No results for input(s): "PHART", "HCO3" in the last 72 hours.  Invalid input(s): "PCO2", "PO2"  Studies/Results: IR IVC FILTER PLMT / S&I Lenise Arena GUID/MOD SED  Result Date: 10/05/2023 CLINICAL DATA:  Lower extremity DVT, pulmonary emboli. Postoperative hemorrhage, a relative contraindication to anticoagulation. Caval filtration requested. EXAM: INFERIOR VENACAVOGRAM IVC FILTER PLACEMENT UNDER FLUOROSCOPY FLUOROSCOPY: Radiation Exposure Index (as provided by the fluoroscopic device): 25 MGy air Kerma TECHNIQUE: Patency of the right IJ vein was confirmed with ultrasound with image documentation. An appropriate skin site was determined. Skin site was marked,  prepped with chlorhexidine, and draped using maximum barrier technique. The region was infiltrated locally with 1% lidocaine. Intravenous Fentanyl and Versed 1mg  were administered as conscious sedation during continuous monitoring of the patient's level of consciousness and physiological / cardiorespiratory status by the radiology RN, with a total moderate sedation time of 14 minutes. Under real-time ultrasound guidance, the right IJ vein was accessed with a 21 gauge micropuncture needle; the needle tip within the vein was confirmed with ultrasound image documentation. The needle was exchanged over a 018 guidewire for a transitional dilator, which allow advancement of the [facility] wire into the IVC. A long 6 French vascular sheath was placed for inferior venacavography. This demonstrated no caval thrombus. Renal vein inflows were evident. The retrievable Denali IVC filter was advanced through the sheath and successfully deployed under fluoroscopy at the L3 level. Followup cavagram demonstrates stable filter position and no evident complication. The sheath was removed and hemostasis achieved at the site. No immediate complication. IMPRESSION: 1. Normal IVC. No thrombus or significant anatomic variation. 2. Technically successful infrarenal IVC filter placement. This is a retrievable model. PLAN: This IVC filter is potentially retrievable. The patient will be assessed for filter retrieval by Interventional Radiology in approximately 8-12 weeks. Further recommendations regarding filter retrieval, continued surveillance or declaration of device permanence, will be made at that time. Electronically Signed   By: Corlis Leak M.D.   On: 10/05/2023 13:43   DG Abd 1 View  Result Date: 10/05/2023 CLINICAL DATA:  [id number] Encounter for nasogastric tube placement [id number]. EXAM:  ABDOMEN - 1 VIEW COMPARISON:  Abdominal radiographs 10/04/2023. FINDINGS: Enteric tube tip and side port project over the stomach. IMPRESSION:  Enteric tube tip and side port project over the stomach. Electronically Signed   By: Orvan Falconer M.D.   On: 10/05/2023 13:43   CT HEMATURIA WORKUP  Result Date: 10/04/2023 CLINICAL DATA:  Hematuria. EXAM: CT ABDOMEN AND PELVIS WITHOUT AND WITH CONTRAST TECHNIQUE: Multidetector CT imaging of the abdomen and pelvis was performed following the standard protocol before and following the bolus administration of intravenous contrast. RADIATION DOSE REDUCTION: This exam was performed according to the departmental dose-optimization program which includes automated exposure control, adjustment of the mA and/or kV according to patient size and/or use of iterative reconstruction technique. CONTRAST:  OMNIPAQUE IOHEXOL 300 MG/ML  SOLN COMPARISON:  Preop CT scan 09/27/2023 and older FINDINGS: Lower chest: Breathing motion at the bases. Tiny right effusion and trace left. Mild basilar atelectasis. Known pulmonary emboli again identified. Hepatobiliary: No focal liver abnormality is seen. No gallstones, gallbladder wall thickening, or biliary dilatation. Patent portal vein. Pancreas: Unremarkable. No pancreatic ductal dilatation or surrounding inflammatory changes. Spleen: Normal in size without focal abnormality. Adrenals/Urinary Tract: Adrenal glands are preserved. There is a exophytic cyst from the lower pole of the right kidney with a thin septation with calcification measuring 6.7 x 6.5 cm in the axial plane. Hounsfield units of 7. No aggressive features. Bosniak 2 lesion. No specific imaging follow-up. No enhancing renal mass. There is a punctate nonobstructing stone along the lower pole of the left kidney. Small punctate hyperdense focus measuring 3 mm along the upper pole left kidney on series 5, image 59 of the precontrast coronal. Slightly brush like appearance to the calices consistent with benign tubular ectasia. The collecting systems are nondilated. The ureters have normal course and caliber down to  the bladder. No urothelial thickening, filling defect or dilatation. Grossly preserved contours of the urinary bladder. There is a small bubble of air in the lumen of the bladder. Please correlate for etiology including any recent instrumentation. Slight areas of wall thickening of the bladder but nonspecific with the adjacent process. Stomach/Bowel: Stomach is fluid-filled. The duodenal is dilated with air-fluid levels measuring up to 3.8 cm. There is also a fluid-filled dilated jejunum in the left midabdomen and pelvis. These extend down to a small bowel suture line in the central pelvis above the bladder as seen on series 7, image 65. No oral contrast. In this location is some air with mixed intermediate density material along the bowel. This could be luminal debris although based on appearance this could be also hematoma. This loop is dilated up to 7.1 cm focally in the axial plane. Also the suture line is somewhat discontiguous in some locations as seen on coronal series 9, image 31. There is also a significant complex fluid collection posterior to this area in the pelvis anterior to the rectum above the bladder on series 7, image 72 measuring 8.9 cm. Again this could be hematoma. 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 Small-bowel resection and anastomosis. The distal ileum is more normal caliber, relatively decompressed The large bowel demonstrates some scattered stool with diverticula. Once again there has been distal colonic resection with a left lower quadrant colostomy. The loop extending to the colostomy is slightly with wall thickening. There is rectosigmoid stump with some luminal debris. Vascular/Lymphatic: Aortic atherosclerosis. No enlarged abdominal or pelvic lymph nodes. Reproductive: Status post hysterectomy. No adnexal masses. Other:  There is open anterior abdominal wall pelvic wound in the midline. Anasarca. Mesenteric stranding. Smaller areas of  trace fluid throughout the mesentery. Presacral and perirectal fat edema. Few bubbles of free air which could be postoperative. Musculoskeletal: Degenerative changes along the spine. IMPRESSION: Surgical changes identified since the prior examination consistent with the provided history of partial left colectomy, colostomy with a Hartmann's pouch procedure and associated small bowel resection with anastomosis. Few scattered areas of free air are as per surgery. The suture line of the small bowel in the pelvis is a dilated loop of bowel with mixed density including some intermediate areas in the lumen. This bowel loop is dilated to 7.1 cm in the proximal jejunum and duodenal is dilated with fluid. This could be a focal developing obstruction. The more distal small bowel is nondilated. In addition there is significant adjacent presumed hematoma in the pelvis posterior to the suture line and on the coronal images the suture line is somewhat indistinct along its course. With level of dilatation, the surrounding fluid, gas and hematoma, please correlate for any clinical evidence of a leak or dehiscence versus expected postop change. Slight wall thickening along the large bowel loop extending out to the colostomy along the left side. Diffuse colonic diverticula. Tiny pleural effusions, right-greater-than-left. Punctate nonobstructing lower pole left-sided renal stone. Bosniak 2 right-sided renal cystic lesion. No enhancing renal mass or separate collecting system dilatation. Slight wall thickening of the urinary bladder but this could relate to the adjacent process. Small bubble of air in the bladder lumen could be related to previous instrumentation but please correlate with history. Known pulmonary emboli. These are somewhat poorly seen on this abdomen and pelvis hematuria workup exam Critical Value/emergent results were called by telephone at the time of interpretation on 10/04/2023 at 3:04 pm to provider [facility] ,  who verbally acknowledged these results and will follow up with the surgery team. Electronically Signed   By: Karen Kays M.D.   On: 10/04/2023 18:09   VAS [place] LOWER EXTREMITY VENOUS (DVT)  Result Date: 10/04/2023  Lower Venous DVT Study Patient Name:  KEENAN COVAN  Date of Exam:   10/04/2023 Medical Rec #: [id number]       Accession #:    [id number] Date of Birth: [birth year]      Patient Gender: F Patient Age:   12 years Exam Location:  [facility] Procedure:      VAS [place] LOWER EXTREMITY VENOUS (DVT) Referring Phys: RAMESH KC --------------------------------------------------------------------------------  Indications: Pulmonary embolism.  Risk Factors: Confirmed PE. Anticoagulation: Heparin. Comparison Study: No prior studies. Performing Technologist: Chanda Busing RVT  Examination Guidelines: A complete evaluation includes B-mode imaging, spectral Doppler, color Doppler, and power Doppler as needed of all accessible portions of each vessel. Bilateral testing is considered an integral part of a complete examination. Limited examinations for reoccurring indications may be performed as noted. The reflux portion of the exam is performed with the patient in reverse Trendelenburg.  +---------+---------------+---------+-----------+----------+--------------+ RIGHT    CompressibilityPhasicitySpontaneityPropertiesThrombus Aging +---------+---------------+---------+-----------+----------+--------------+ CFV      Full           Yes      Yes                                 +---------+---------------+---------+-----------+----------+--------------+ SFJ      Full                                                        +---------+---------------+---------+-----------+----------+--------------+  FV Prox  Full                                                        +---------+---------------+---------+-----------+----------+--------------+ FV Mid   Full                                                         +---------+---------------+---------+-----------+----------+--------------+ FV DistalFull                                                        +---------+---------------+---------+-----------+----------+--------------+ PFV      Full                                                        +---------+---------------+---------+-----------+----------+--------------+ POP      Full           Yes      Yes                                 +---------+---------------+---------+-----------+----------+--------------+ PTV      Full                                                        +---------+---------------+---------+-----------+----------+--------------+ PERO     Full                                                        +---------+---------------+---------+-----------+----------+--------------+   +---------+---------------+---------+-----------+----------+--------------+ LEFT     CompressibilityPhasicitySpontaneityPropertiesThrombus Aging +---------+---------------+---------+-----------+----------+--------------+ CFV      Full           Yes      Yes                                 +---------+---------------+---------+-----------+----------+--------------+ SFJ      Full                                                        +---------+---------------+---------+-----------+----------+--------------+ FV Prox  Full                                                        +---------+---------------+---------+-----------+----------+--------------+  FV Mid   Full                                                        +---------+---------------+---------+-----------+----------+--------------+ FV DistalFull                                                        +---------+---------------+---------+-----------+----------+--------------+ PFV      Full                                                         +---------+---------------+---------+-----------+----------+--------------+ POP      Partial        Yes      Yes                  Acute          +---------+---------------+---------+-----------+----------+--------------+ PTV      Partial                                      Acute          +---------+---------------+---------+-----------+----------+--------------+ PERO     Partial                                      Acute          +---------+---------------+---------+-----------+----------+--------------+ Gastroc  None                                         Acute          +---------+---------------+---------+-----------+----------+--------------+     Summary: RIGHT: - There is no evidence of deep vein thrombosis in the lower extremity.  - No cystic structure found in the popliteal fossa.  LEFT: - Findings consistent with acute deep vein thrombosis involving the left popliteal vein, left posterior tibial veins, and left peroneal veins. Findings consistent with acute intramuscular thrombosis involving the left gastrocnemius veins. - No cystic structure found in the popliteal fossa.  *See table(s) above for measurements and observations. Electronically signed by Heath Lark on 10/04/2023 at 5:38:11 PM.    Final    DG Abd Portable 1V  Result Date: 10/04/2023 CLINICAL DATA:  Nausea and vomiting. EXAM: PORTABLE ABDOMEN - 1 VIEW COMPARISON:  09/27/2019 for FINDINGS: The bowel gas pattern has improved from the previous exam. There is been interval decrease in previously noted colonic distension. Within the left lower quadrant of the abdomen there are several dilated loops of small bowel which measure up to 3 mm, similar to the previous CT. IMPRESSION: 1. Interval improvement in diffuse colonic distension. 2. Persistent mild dilatation of small bowel loops in the left lower quadrant of the abdomen. Electronically Signed   By: Signa Kell M.D.   On:  10/04/2023 11:00     Anti-infectives: Anti-infectives (From admission, onward)    Start     Dose/Rate Route Frequency Ordered Stop   10/05/23 1200  piperacillin-tazobactam (ZOSYN) IVPB 3.375 g        3.375 g 12.5 mL/hr over 240 Minutes Intravenous Every 8 hours 10/05/23 0721 10/09/23 1959   10/04/23 2000  piperacillin-tazobactam (ZOSYN) IVPB 3.375 g  Status:  Discontinued        3.375 g 12.5 mL/hr over 240 Minutes Intravenous Every 8 hours 10/04/23 1827 10/05/23 0721   09/27/23 2315  piperacillin-tazobactam (ZOSYN) IVPB 3.375 g  Status:  Discontinued        3.375 g 12.5 mL/hr over 240 Minutes Intravenous Every 8 hours 09/27/23 2305 10/02/23 1106       Assessment/Plan: s/p ex lap, Hartman's resection and colostomy with small bowel resection, Dr. [name] 10/01/23 -post-operative ileus - likely exacerbated by intra-abdominal bleeding secondary to anticoagulation with hematoma around the SB anastomosis - NG tube until clear signs of bowel function - WTD dressing changes -follow therapy recs - hold anticoagulation; IVC filter in place -CT from overnight with pelvic hematoma and hematoma around SB anastomosis as well.  Some small dots of air, which are no unexpected only 3-4 days out from surgery.  Patient looks very stable with no pain in her abdomen.  Do not suspect any leak or infectious complications.   -would only do zosyn for 5 days at this point. D/C after today's doses    FEN: NPO/IVFs/NGT VTE: IVC filter ID: Zosyn   PEs/DVTs - IVC filter placement  A fib HTN ABL anemia - Hgb 11 down to 9.6  LOS: 9 days    Elizabeth Odom 10/06/2023

## 2023-10-07 DIAGNOSIS — D62 Acute posthemorrhagic anemia: Secondary | ICD-10-CM | POA: Diagnosis not present

## 2023-10-07 DIAGNOSIS — Z7189 Other specified counseling: Secondary | ICD-10-CM | POA: Diagnosis not present

## 2023-10-07 DIAGNOSIS — K529 Noninfective gastroenteritis and colitis, unspecified: Secondary | ICD-10-CM | POA: Diagnosis not present

## 2023-10-07 DIAGNOSIS — K56609 Unspecified intestinal obstruction, unspecified as to partial versus complete obstruction: Secondary | ICD-10-CM | POA: Diagnosis not present

## 2023-10-07 LAB — CBC WITH DIFFERENTIAL/PLATELET
Abs Immature Granulocytes: 0.16 10*3/uL — ABNORMAL HIGH (ref 0.00–0.07)
Basophils Absolute: 0 10*3/uL (ref 0.0–0.1)
Basophils Relative: 0 %
Eosinophils Absolute: 0.2 10*3/uL (ref 0.0–0.5)
Eosinophils Relative: 2 %
HCT: 32.1 % — ABNORMAL LOW (ref 36.0–46.0)
Hemoglobin: 10.3 g/dL — ABNORMAL LOW (ref 12.0–15.0)
Immature Granulocytes: 1 %
Lymphocytes Relative: 5 %
Lymphs Abs: 0.7 10*3/uL (ref 0.7–4.0)
MCH: 30.6 pg (ref 26.0–34.0)
MCHC: 32.1 g/dL (ref 30.0–36.0)
MCV: 95.3 fL (ref 80.0–100.0)
Monocytes Absolute: 1 10*3/uL (ref 0.1–1.0)
Monocytes Relative: 7 %
Neutro Abs: 11.8 10*3/uL — ABNORMAL HIGH (ref 1.7–7.7)
Neutrophils Relative %: 85 %
Platelets: 250 10*3/uL (ref 150–400)
RBC: 3.37 MIL/uL — ABNORMAL LOW (ref 3.87–5.11)
RDW: 16.1 % — ABNORMAL HIGH (ref 11.5–15.5)
WBC: 13.9 10*3/uL — ABNORMAL HIGH (ref 4.0–10.5)
nRBC: 0 % (ref 0.0–0.2)

## 2023-10-07 LAB — COMPREHENSIVE METABOLIC PANEL
ALT: 23 U/L (ref 0–44)
AST: 22 U/L (ref 15–41)
Albumin: 2.3 g/dL — ABNORMAL LOW (ref 3.5–5.0)
Alkaline Phosphatase: 90 U/L (ref 38–126)
Anion gap: 9 (ref 5–15)
BUN: 27 mg/dL — ABNORMAL HIGH (ref 8–23)
CO2: 28 mmol/L (ref 22–32)
Calcium: 8.5 mg/dL — ABNORMAL LOW (ref 8.9–10.3)
Chloride: 103 mmol/L (ref 98–111)
Creatinine, Ser: 0.72 mg/dL (ref 0.44–1.00)
GFR, Estimated: >60 mL/min (ref >60)
Glucose, Bld: 104 mg/dL — ABNORMAL HIGH (ref 70–99)
Potassium: 3.5 mmol/L (ref 3.5–5.1)
Sodium: 140 mmol/L (ref 135–145)
Total Bilirubin: 1.4 mg/dL — ABNORMAL HIGH (ref 0.3–1.2)
Total Protein: 5.7 g/dL — ABNORMAL LOW (ref 6.5–8.1)

## 2023-10-07 LAB — GLUCOSE, CAPILLARY
Glucose-Capillary: 84 mg/dL (ref 70–99)
Glucose-Capillary: 102 mg/dL — ABNORMAL HIGH (ref 70–99)
Glucose-Capillary: 93 mg/dL (ref 70–99)
Glucose-Capillary: 98 mg/dL (ref 70–99)
Glucose-Capillary: 113 mg/dL — ABNORMAL HIGH (ref 70–99)

## 2023-10-07 LAB — MAGNESIUM: Magnesium: 2 mg/dL (ref 1.7–2.4)

## 2023-10-07 LAB — PHOSPHORUS: Phosphorus: 3 mg/dL (ref 2.5–4.6)

## 2023-10-07 MED ORDER — POTASSIUM CHLORIDE 10 MEQ/100ML IV SOLN
10.0000 meq | INTRAVENOUS | Status: AC
  Administered 2023-10-07 (×4): 10 meq via INTRAVENOUS
  Filled 2023-10-07 (×3): qty 100

## 2023-10-07 MED ORDER — POTASSIUM CHLORIDE 10 MEQ/100ML IV SOLN
INTRAVENOUS | Status: AC
  Filled 2023-10-07: qty 100

## 2023-10-07 NOTE — Progress Notes (Signed)
6 Days Post-Op   Subjective/Chief Complaint: No significant nausea or vomiting NG output 950 cc - eating a lot of ice chips Hgb up to 10.3 WBC improving   Objective: Vital signs in last 24 hours: Temp:  [97.7 F (36.5 C)-98.4 F (36.9 C)] 97.7 F (36.5 C) (10/27 0400) Pulse Rate:  [31-112] 96 (10/27 0800) Resp:  [12-23] 20 (10/27 0800) BP: (123-164)/(49-134) 150/59 (10/27 0800) SpO2:  [90 %-99 %] 96 % (10/27 0800) Weight:  [63.1 kg] 63.1 kg (10/27 0800) Last BM Date : 10/05/23  Intake/Output from previous day: 10/26 0701 - 10/27 0700 In: 583.3 [I.V.:353.8; IV Piggyback:229.6] Out: 2250 [Urine:1300; Emesis/NG output:950] Intake/Output this shift: No intake/output data recorded.  WDWN in NAD Incision - packed; clean; no granulation tissue yet Ostomy - more gas in bag with minimal soft brown stool  Lab Results:  Recent Labs    10/06/23 0312 10/07/23 0313  WBC 16.6* 13.9*  HGB 9.6* 10.3*  HCT 28.8* 32.1*  PLT 212 250   BMET Recent Labs    10/06/23 0312 10/07/23 0313  NA 143 140  K 3.2* 3.5  CL 107 103  CO2 28 28  GLUCOSE 131* 104*  BUN 27* 27*  CREATININE 0.66 0.72  CALCIUM 8.4* 8.5*   PT/INR Recent Labs    10/05/23 0247  LABPROT 14.3  INR 1.1   ABG No results for input(s): "PHART", "HCO3" in the last 72 hours.  Invalid input(s): "PCO2", "PO2"  Studies/Results: IR IVC FILTER PLMT / S&I Lenise Arena GUID/MOD SED  Result Date: 10/05/2023 CLINICAL DATA:  Lower extremity DVT, pulmonary emboli. Postoperative hemorrhage, a relative contraindication to anticoagulation. Caval filtration requested. EXAM: INFERIOR VENACAVOGRAM IVC FILTER PLACEMENT UNDER FLUOROSCOPY FLUOROSCOPY: Radiation Exposure Index (as provided by the fluoroscopic device): 25 MGy air Kerma TECHNIQUE: Patency of the right IJ vein was confirmed with ultrasound with image documentation. An appropriate skin site was determined. Skin site was marked, prepped with chlorhexidine, and draped using  maximum barrier technique. The region was infiltrated locally with 1% lidocaine. Intravenous Fentanyl and Versed 1mg  were administered as conscious sedation during continuous monitoring of the patient's level of consciousness and physiological / cardiorespiratory status by the radiology RN, with a total moderate sedation time of 14 minutes. Under real-time ultrasound guidance, the right IJ vein was accessed with a 21 gauge micropuncture needle; the needle tip within the vein was confirmed with ultrasound image documentation. The needle was exchanged over a 018 guidewire for a transitional dilator, which allow advancement of the [facility] wire into the IVC. A long 6 French vascular sheath was placed for inferior venacavography. This demonstrated no caval thrombus. Renal vein inflows were evident. The retrievable Denali IVC filter was advanced through the sheath and successfully deployed under fluoroscopy at the L3 level. Followup cavagram demonstrates stable filter position and no evident complication. The sheath was removed and hemostasis achieved at the site. No immediate complication. IMPRESSION: 1. Normal IVC. No thrombus or significant anatomic variation. 2. Technically successful infrarenal IVC filter placement. This is a retrievable model. PLAN: This IVC filter is potentially retrievable. The patient will be assessed for filter retrieval by Interventional Radiology in approximately 8-12 weeks. Further recommendations regarding filter retrieval, continued surveillance or declaration of device permanence, will be made at that time. Electronically Signed   By: Corlis Leak M.D.   On: 10/05/2023 13:43   DG Abd 1 View  Result Date: 10/05/2023 CLINICAL DATA:  [id number] Encounter for nasogastric tube placement [id number]. EXAM: ABDOMEN - 1 VIEW  COMPARISON:  Abdominal radiographs 10/04/2023. FINDINGS: Enteric tube tip and side port project over the stomach. IMPRESSION: Enteric tube tip and side port project over the  stomach. Electronically Signed   By: Orvan Falconer M.D.   On: 10/05/2023 13:43    Anti-infectives: Anti-infectives (From admission, onward)    Start     Dose/Rate Route Frequency Ordered Stop   10/05/23 1200  piperacillin-tazobactam (ZOSYN) IVPB 3.375 g        3.375 g 12.5 mL/hr over 240 Minutes Intravenous Every 8 hours 10/05/23 0721 10/09/23 1959   10/04/23 2000  piperacillin-tazobactam (ZOSYN) IVPB 3.375 g  Status:  Discontinued        3.375 g 12.5 mL/hr over 240 Minutes Intravenous Every 8 hours 10/04/23 1827 10/05/23 0721   09/27/23 2315  piperacillin-tazobactam (ZOSYN) IVPB 3.375 g  Status:  Discontinued        3.375 g 12.5 mL/hr over 240 Minutes Intravenous Every 8 hours 09/27/23 2305 10/02/23 1106       Assessment/Plan: Expand All Collapse All  5 Days Post-Op    Subjective/Chief Complaint: Patient feels better with NG tube in place.  Nausea improved IVC filter placed yesterday Hgb down to 9.6 from 11.1 yesterday       Objective: Vital signs in last 24 hours: Temp:  [98.2 F (36.8 C)-98.8 F (37.1 C)] 98.2 F (36.8 C) (10/25 2351) Pulse Rate:  [38-85] 38 (10/26 0746) Resp:  [15-29] 19 (10/26 0800) BP: (110-142)/(46-91) 134/49 (10/26 0800) SpO2:  [91 %-99 %] 98 % (10/26 0800) Weight:  [62.8 kg] 62.8 kg (10/26 0746) Last BM Date : 10/05/23 (from ostomy)   Intake/Output from previous day: 10/25 0701 - 10/26 0700 In: 1067.4 [I.V.:438.2; Blood:348.5; IV Piggyback:280.8] Out: 1216 [Urine:585; Emesis/NG output:601; Stool:30] Intake/Output this shift: No intake/output data recorded.   WDWN in NAD Incision - packed; clean; no granulation tissue yet Ostomy - gas in bag with minimal soft brown stool   Lab Results:  Recent Labs (last 2 labs)      Recent Labs    10/05/23 1016 10/06/23 0312  WBC 19.2* 16.6*  HGB 11.1* 9.6*  HCT 33.2* 28.8*  PLT 227 212      BMET Recent Labs (last 2 labs)      Recent Labs    10/05/23 0247 10/06/23 0312  NA 140 143   K 3.3* 3.2*  CL 108 107  CO2 26 28  GLUCOSE 143* 131*  BUN 30* 27*  CREATININE 0.75 0.66  CALCIUM 8.1* 8.4*      PT/INR Recent Labs (last 2 labs)     Recent Labs    10/05/23 0247  LABPROT 14.3  INR 1.1      ABG  Recent Labs (last 2 labs)  No results for input(s): "PHART", "HCO3" in the last 72 hours.   Invalid input(s): "PCO2", "PO2"     Studies/Results:  Imaging Results (Last 48 hours)  IR IVC FILTER PLMT / S&I Lenise Arena GUID/MOD SED   Result Date: 10/05/2023 CLINICAL DATA:  Lower extremity DVT, pulmonary emboli. Postoperative hemorrhage, a relative contraindication to anticoagulation. Caval filtration requested. EXAM: INFERIOR VENACAVOGRAM IVC FILTER PLACEMENT UNDER FLUOROSCOPY FLUOROSCOPY: Radiation Exposure Index (as provided by the fluoroscopic device): 25 MGy air Kerma TECHNIQUE: Patency of the right IJ vein was confirmed with ultrasound with image documentation. An appropriate skin site was determined. Skin site was marked, prepped with chlorhexidine, and draped using maximum barrier technique. The region was infiltrated locally with 1% lidocaine. Intravenous Fentanyl and Versed  1mg  were administered as conscious sedation during continuous monitoring of the patient's level of consciousness and physiological / cardiorespiratory status by the radiology RN, with a total moderate sedation time of 14 minutes. Under real-time ultrasound guidance, the right IJ vein was accessed with a 21 gauge micropuncture needle; the needle tip within the vein was confirmed with ultrasound image documentation. The needle was exchanged over a 018 guidewire for a transitional dilator, which allow advancement of the [facility] wire into the IVC. A long 6 French vascular sheath was placed for inferior venacavography. This demonstrated no caval thrombus. Renal vein inflows were evident. The retrievable Denali IVC filter was advanced through the sheath and successfully deployed under fluoroscopy at the L3  level. Followup cavagram demonstrates stable filter position and no evident complication. The sheath was removed and hemostasis achieved at the site. No immediate complication. IMPRESSION: 1. Normal IVC. No thrombus or significant anatomic variation. 2. Technically successful infrarenal IVC filter placement. This is a retrievable model. PLAN: This IVC filter is potentially retrievable. The patient will be assessed for filter retrieval by Interventional Radiology in approximately 8-12 weeks. Further recommendations regarding filter retrieval, continued surveillance or declaration of device permanence, will be made at that time. Electronically Signed   By: Corlis Leak M.D.   On: 10/05/2023 13:43    DG Abd 1 View   Result Date: 10/05/2023 CLINICAL DATA:  [id number] Encounter for nasogastric tube placement [id number]. EXAM: ABDOMEN - 1 VIEW COMPARISON:  Abdominal radiographs 10/04/2023. FINDINGS: Enteric tube tip and side port project over the stomach. IMPRESSION: Enteric tube tip and side port project over the stomach. Electronically Signed   By: Orvan Falconer M.D.   On: 10/05/2023 13:43    CT HEMATURIA WORKUP   Result Date: 10/04/2023 CLINICAL DATA:  Hematuria. EXAM: CT ABDOMEN AND PELVIS WITHOUT AND WITH CONTRAST TECHNIQUE: Multidetector CT imaging of the abdomen and pelvis was performed following the standard protocol before and following the bolus administration of intravenous contrast. RADIATION DOSE REDUCTION: This exam was performed according to the departmental dose-optimization program which includes automated exposure control, adjustment of the mA and/or kV according to patient size and/or use of iterative reconstruction technique. CONTRAST:  OMNIPAQUE IOHEXOL 300 MG/ML  SOLN COMPARISON:  Preop CT scan 09/27/2023 and older FINDINGS: Lower chest: Breathing motion at the bases. Tiny right effusion and trace left. Mild basilar atelectasis. Known pulmonary emboli again identified. Hepatobiliary: No focal  liver abnormality is seen. No gallstones, gallbladder wall thickening, or biliary dilatation. Patent portal vein. Pancreas: Unremarkable. No pancreatic ductal dilatation or surrounding inflammatory changes. Spleen: Normal in size without focal abnormality. Adrenals/Urinary Tract: Adrenal glands are preserved. There is a exophytic cyst from the lower pole of the right kidney with a thin septation with calcification measuring 6.7 x 6.5 cm in the axial plane. Hounsfield units of 7. No aggressive features. Bosniak 2 lesion. No specific imaging follow-up. No enhancing renal mass. There is a punctate nonobstructing stone along the lower pole of the left kidney. Small punctate hyperdense focus measuring 3 mm along the upper pole left kidney on series 5, image 59 of the precontrast coronal. Slightly brush like appearance to the calices consistent with benign tubular ectasia. The collecting systems are nondilated. The ureters have normal course and caliber down to the bladder. No urothelial thickening, filling defect or dilatation. Grossly preserved contours of the urinary bladder. There is a small bubble of air in the lumen of the bladder. Please correlate for etiology including any recent instrumentation. Slight  areas of wall thickening of the bladder but nonspecific with the adjacent process. Stomach/Bowel: Stomach is fluid-filled. The duodenal is dilated with air-fluid levels measuring up to 3.8 cm. There is also a fluid-filled dilated jejunum in the left midabdomen and pelvis. These extend down to a small bowel suture line in the central pelvis above the bladder as seen on series 7, image 65. No oral contrast. In this location is some air with mixed intermediate density material along the bowel. This could be luminal debris although based on appearance this could be also hematoma. This loop is dilated up to 7.1 cm focally in the axial plane. Also the suture line is somewhat discontiguous in some locations as seen on  coronal series 9, image 31. There is also a significant complex fluid collection posterior to this area in the pelvis anterior to the rectum above the bladder on series 7, image 72 measuring 8.9 cm. Again this could be hematoma. 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 Small-bowel resection and anastomosis. The distal ileum is more normal caliber, relatively decompressed The large bowel demonstrates some scattered stool with diverticula. Once again there has been distal colonic resection with a left lower quadrant colostomy. The loop extending to the colostomy is slightly with wall thickening. There is rectosigmoid stump with some luminal debris. Vascular/Lymphatic: Aortic atherosclerosis. No enlarged abdominal or pelvic lymph nodes. Reproductive: Status post hysterectomy. No adnexal masses. Other: There is open anterior abdominal wall pelvic wound in the midline. Anasarca. Mesenteric stranding. Smaller areas of trace fluid throughout the mesentery. Presacral and perirectal fat edema. Few bubbles of free air which could be postoperative. Musculoskeletal: Degenerative changes along the spine. IMPRESSION: Surgical changes identified since the prior examination consistent with the provided history of partial left colectomy, colostomy with a Hartmann's pouch procedure and associated small bowel resection with anastomosis. Few scattered areas of free air are as per surgery. The suture line of the small bowel in the pelvis is a dilated loop of bowel with mixed density including some intermediate areas in the lumen. This bowel loop is dilated to 7.1 cm in the proximal jejunum and duodenal is dilated with fluid. This could be a focal developing obstruction. The more distal small bowel is nondilated. In addition there is significant adjacent presumed hematoma in the pelvis posterior to the suture line and on the coronal images the suture line is somewhat indistinct along its  course. With level of dilatation, the surrounding fluid, gas and hematoma, please correlate for any clinical evidence of a leak or dehiscence versus expected postop change. Slight wall thickening along the large bowel loop extending out to the colostomy along the left side. Diffuse colonic diverticula. Tiny pleural effusions, right-greater-than-left. Punctate nonobstructing lower pole left-sided renal stone. Bosniak 2 right-sided renal cystic lesion. No enhancing renal mass or separate collecting system dilatation. Slight wall thickening of the urinary bladder but this could relate to the adjacent process. Small bubble of air in the bladder lumen could be related to previous instrumentation but please correlate with history. Known pulmonary emboli. These are somewhat poorly seen on this abdomen and pelvis hematuria workup exam Critical Value/emergent results were called by telephone at the time of interpretation on 10/04/2023 at 3:04 pm to provider [facility] , who verbally acknowledged these results and will follow up with the surgery team. Electronically Signed   By: Karen Kays M.D.   On: 10/04/2023 18:09    VAS [place] LOWER EXTREMITY VENOUS (DVT)  Result Date: 10/04/2023  Lower Venous DVT Study Patient Name:  Elizabeth Odom  Date of Exam:   10/04/2023 Medical Rec #: [id number]       Accession #:    [id number] Date of Birth: 12/22/1940      Patient Gender: F Patient Age:   1 years Exam Location:  [facility] Procedure:      VAS [place] LOWER EXTREMITY VENOUS (DVT) Referring Phys: RAMESH KC --------------------------------------------------------------------------------  Indications: Pulmonary embolism.  Risk Factors: Confirmed PE. Anticoagulation: Heparin. Comparison Study: No prior studies. Performing Technologist: Chanda Busing RVT  Examination Guidelines: A complete evaluation includes B-mode imaging, spectral Doppler, color Doppler, and power Doppler as needed of all accessible portions of each  vessel. Bilateral testing is considered an integral part of a complete examination. Limited examinations for reoccurring indications may be performed as noted. The reflux portion of the exam is performed with the patient in reverse Trendelenburg.  +---------+---------------+---------+-----------+----------+--------------+ RIGHT    CompressibilityPhasicitySpontaneityPropertiesThrombus Aging +---------+---------------+---------+-----------+----------+--------------+ CFV      Full           Yes      Yes                                 +---------+---------------+---------+-----------+----------+--------------+ SFJ      Full                                                        +---------+---------------+---------+-----------+----------+--------------+ FV Prox  Full                                                        +---------+---------------+---------+-----------+----------+--------------+ FV Mid   Full                                                        +---------+---------------+---------+-----------+----------+--------------+ FV DistalFull                                                        +---------+---------------+---------+-----------+----------+--------------+ PFV      Full                                                        +---------+---------------+---------+-----------+----------+--------------+ POP      Full           Yes      Yes                                 +---------+---------------+---------+-----------+----------+--------------+ PTV      Full                                                        +---------+---------------+---------+-----------+----------+--------------+  PERO     Full                                                        +---------+---------------+---------+-----------+----------+--------------+   +---------+---------------+---------+-----------+----------+--------------+ LEFT      CompressibilityPhasicitySpontaneityPropertiesThrombus Aging +---------+---------------+---------+-----------+----------+--------------+ CFV      Full           Yes      Yes                                 +---------+---------------+---------+-----------+----------+--------------+ SFJ      Full                                                        +---------+---------------+---------+-----------+----------+--------------+ FV Prox  Full                                                        +---------+---------------+---------+-----------+----------+--------------+ FV Mid   Full                                                        +---------+---------------+---------+-----------+----------+--------------+ FV DistalFull                                                        +---------+---------------+---------+-----------+----------+--------------+ PFV      Full                                                        +---------+---------------+---------+-----------+----------+--------------+ POP      Partial        Yes      Yes                  Acute          +---------+---------------+---------+-----------+----------+--------------+ PTV      Partial                                      Acute          +---------+---------------+---------+-----------+----------+--------------+ PERO     Partial                                      Acute          +---------+---------------+---------+-----------+----------+--------------+ Gastroc  None  Acute          +---------+---------------+---------+-----------+----------+--------------+     Summary: RIGHT: - There is no evidence of deep vein thrombosis in the lower extremity.  - No cystic structure found in the popliteal fossa.  LEFT: - Findings consistent with acute deep vein thrombosis involving the left popliteal vein, left posterior tibial veins, and left peroneal  veins. Findings consistent with acute intramuscular thrombosis involving the left gastrocnemius veins. - No cystic structure found in the popliteal fossa.  *See table(s) above for measurements and observations. Electronically signed by Heath Lark on 10/04/2023 at 5:38:11 PM.    Final     DG Abd Portable 1V   Result Date: 10/04/2023 CLINICAL DATA:  Nausea and vomiting. EXAM: PORTABLE ABDOMEN - 1 VIEW COMPARISON:  09/27/2019 for FINDINGS: The bowel gas pattern has improved from the previous exam. There is been interval decrease in previously noted colonic distension. Within the left lower quadrant of the abdomen there are several dilated loops of small bowel which measure up to 3 mm, similar to the previous CT. IMPRESSION: 1. Interval improvement in diffuse colonic distension. 2. Persistent mild dilatation of small bowel loops in the left lower quadrant of the abdomen. Electronically Signed   By: Signa Kell M.D.   On: 10/04/2023 11:00       Anti-infectives: Anti-infectives (From admission, onward)        Start     Dose/Rate Route Frequency Ordered Stop    10/05/23 1200   piperacillin-tazobactam (ZOSYN) IVPB 3.375 g        3.375 g 12.5 mL/hr over 240 Minutes Intravenous Every 8 hours 10/05/23 0721 10/09/23 1959    10/04/23 2000   piperacillin-tazobactam (ZOSYN) IVPB 3.375 g  Status:  Discontinued        3.375 g 12.5 mL/hr over 240 Minutes Intravenous Every 8 hours 10/04/23 1827 10/05/23 0721    09/27/23 2315   piperacillin-tazobactam (ZOSYN) IVPB 3.375 g  Status:  Discontinued        3.375 g 12.5 mL/hr over 240 Minutes Intravenous Every 8 hours 09/27/23 2305 10/02/23 1106             Assessment/Plan: s/p ex lap, Hartman's resection and colostomy with small bowel resection, Dr. [name] 10/01/23 for diverticular stricture -post-operative ileus - likely exacerbated by intra-abdominal bleeding secondary to anticoagulation with hematoma around the SB anastomosis - NG tube until clear  signs of bowel function - WTD dressing changes -follow therapy recs - hold anticoagulation; IVC filter in place -CT with pelvic hematoma and hematoma around SB anastomosis as well.  Some small dots of air, which are no unexpected only 3-4 days out from surgery.  Patient looks very stable with no pain in her abdomen.  Do not suspect any leak or infectious complications.   -    LOS: 10 days    Wynona Luna 10/07/2023

## 2023-10-07 NOTE — Plan of Care (Signed)
  Problem: Coping: Goal: Ability to adjust to condition or change in health will improve Outcome: Progressing   Problem: Metabolic: Goal: Ability to maintain appropriate glucose levels will improve Outcome: Progressing   Problem: Education: Goal: Knowledge of General Education information will improve Description: Including pain rating scale, medication(s)/side effects and non-pharmacologic comfort measures Outcome: Progressing   Problem: Clinical Measurements: Goal: Ability to maintain clinical measurements within normal limits will improve Outcome: Progressing Goal: Will remain free from infection Outcome: Progressing Goal: Respiratory complications will improve Outcome: Progressing   Problem: Activity: Goal: Risk for activity intolerance will decrease Outcome: Progressing   Problem: Pain Managment: Goal: General experience of comfort will improve Outcome: Progressing   Problem: Cardiovascular: Goal: Ability to achieve and maintain adequate cardiovascular perfusion will improve Outcome: Progressing

## 2023-10-07 NOTE — Progress Notes (Signed)
PROGRESS NOTE  Elizabeth Odom ZOX:[id number] DOB: 09-01-1941   PCP: [name], FNP  Patient is from: Home  DOA: 09/27/2023 LOS: 10  Chief complaints Chief Complaint  Patient presents with   Abdominal Pain     Brief Narrative / Interim history: 82 year old F with PMH of A-fib, RLS and anemia presented to ED on 10/17 with recurrent abdominal pain, nausea, emesis, diarrhea and intolerance of oral intake.  Patient was seen in ED on 10/10 for abdominal pain, decreased appetite and fever and discharged on Augmentin for possible colitis as noted on CT abdomen and pelvis.  However, she has recurrence of his symptoms after interval improvement.  In ED, vital stable.  She has leukocytosis to 15.5 with left shift.  CT showed proximal sigmoid colon wall thickening with upstream colonic dilation with air-fluid levels and large amount of stool compatible with obstruction.  There is also a 9 mm cystic lesion in the tail of pancreas, and subcentimeter lucent lesion in L1 vertebral body.  Evaluated by GI.  She refused colonoscopy but agreed to sigmoidoscopy that was done on 10/19 that showed very tight stricture versus mass in rectosigmoid that could not be transversed even with the endoscope.  General surgery consulted and she underwent ex lap with small bowel resection, Hartman's resection and colostomy on 10/21.  Hospital course complicated by acute PE and LLE DVT for which she was started on heparin.  Unfortunately, she developed ABLA with hematoma and pelvis and around small bowel anastomosis as well as gross hematuria.  Hemoglobin reached nadir at 6.8.  Anticoagulation discontinued.  Transfused a total of 4 units.  Transferred to stepdown.  PCCM consulted.  CODE STATUS changed to DNR/DNI.  She had IVC filter placed on 10/25 by IR..  Currently with NG tube for postoperative ileus.  General surgery following.  Subjective: Seen and examined earlier this morning.  No major events overnight of this  morning.  No complaints other than some pain in her right arm from IV potassium.  No significant output in ostomy bag.  Objective: Vitals:   10/07/23 0600 10/07/23 0700 10/07/23 0800 10/07/23 0851  BP: (!) 143/70 (!) 153/90 (!) 150/59   Pulse: 62 96 96 (!) 55  Resp: 17 18 20  (!) 21  Temp:      TempSrc:      SpO2: 93% 95% 96% 94%  Weight:   63.1 kg   Height:        Examination:  GENERAL: No apparent distress.  Nontoxic. HEENT: Somewhat dry mucous membrane.  Vision and hearing grossly intact.  NGT. NECK: Supple.  No apparent JVD.  RESP:  No IWOB.  Fair aeration bilaterally. CVS: Irregular rhythm.  Normal rate.  Heart sounds normal.  ABD/GI/GU: BS diminished. Abd soft.  Ostomy bag in PT.  Laparotomy wound dressing DCI. MSK/EXT:  Moves extremities. No apparent deformity. No edema.  SKIN: no apparent skin lesion or wound NEURO: Awake, alert and oriented appropriately.  No apparent focal neuro deficit. PSYCH: Calm. Normal affect.   Procedures:  10/19-sigmoidoscopy 10/21-ex lap with small bowel resection, Hartman's and colostomy 10/25-IVC filter  Microbiology summarized: Surgical MRSA PCR screen positive.  Assessment and plan: Principal Problem:   Large bowel obstruction (HCC) Active Problems:   Acute colitis   Left leg DVT (HCC)   ABLA (acute blood loss anemia)   Palliative care encounter   Counseling and coordination of care   Need for emotional support   Goals of care, counseling/discussion  Large bowel obstruction -  10/17-CT with proximal sigmoid colon thickening -10/19-sigmoidoscopy with very tight stricture versus mass in rectosigmoid colon -10/21-s/p s/p ex lap Hartman's resectio, small bowel resection and colostomy.   -Biopsy negative for malignancy but suggested inflammation. -General Surgery managing.-WTD dressing for wound, hold anticoagulation, n.p.o., IVF, NGT -On IV Zosyn. -Pain control  Postoperative ileus vs partial SBO-felt to be exacerbated by  intra-abdominal bleeding. -NPO, NGT, IVF  Postoperative ABLA due to pelvic and perianastomotic hematoma and hematuria: Hgb reached nadir at 6.8 on 10/24.  Transfused 2 units on 10/24 and additional 2 units on 10/25.  H&H stable. Recent Labs    10/03/23 0345 10/03/23 2124 10/04/23 0359 10/04/23 1239 10/04/23 1608 10/04/23 2021 10/05/23 0247 10/05/23 1016 10/06/23 0312 10/07/23 0313  HGB 8.6* 7.0* 6.8* 7.4* 7.5* 7.0* 7.8* 11.1* 9.6* 10.3*  -Monitor -Anticoagulation on hold  Acute PE w/o right heart strain/acute left leg DVT: Noted on CT chest on 10/20.  No heart strain -Anticoagulation discontinued due to ABLA/hematoma/hematuria -S/p IVC filter for LLE DVT on 10/25  Gross hematuria: CT hematuria protocol did not show acute urinary process.  Seems to have resolved. -Anticoagulation on hold. -Appreciate input by urology.   Pancreatic tail cyst: 9 mm pancreatic tail cyst noted on CT abdomen and pelvis. -Pre and post current abdominal MRI in 1 year recommended   Nodule in the inferior aspect of the isthmus  -will need thyroid ultrasound not emergently.   Chronic HFpEF: TTE with EF 77 0% G1-DD.   -Continue IV fluid while n.p.o.  -Strict intake and output, daily weights renal functions and electrolyte  Paroxysmal  atrial fibrillation: Rate controlled.  Not on meds PTA. -Telemetry monitoring -Optimize electrolytes  Hyperglycemia/prediabetes: A1c 5.8%. -Monitor with daily labs  Hyponatremia: Resolved  Hypokalemia: -Monitor replenish as appropriate   Leukocytosis: Seems persistent.  Likely due to #1 -Continue monitoring  Goals of care: CODE STATUS changed to DNR/DNI after discussion between PCCM, patient and family on 10/24. -Palliative medicine on board  Body mass index is 23.15 kg/m.          DVT prophylaxis:  Place and maintain sequential compression device Start: 10/06/23 1319SCD to right leg  Code Status: DNR/DNI Family Communication: None at  bedside Level of care: Stepdown Status is: Inpatient Remains inpatient appropriate because: Colonic obstruction, ileus, ABLA, VTE   Final disposition: TBD Consultants:  Gastroenterology General Surgery PCCM Palliative medicine  55 minutes with more than 50% spent in reviewing records, counseling patient/family and coordinating care.   Sch Meds:  Scheduled Meds:  bisacodyl  10 mg Rectal Once   Chlorhexidine Gluconate Cloth  6 each Topical Daily   insulin aspart  0-9 Units Subcutaneous Q4H   Continuous Infusions:  piperacillin-tazobactam (ZOSYN)  IV Stopped (10/07/23 0845)   potassium chloride 10 mEq (10/07/23 1209)   potassium chloride     PRN Meds:.acetaminophen **OR** acetaminophen, HYDROmorphone (DILAUDID) injection, liver oil-zinc oxide, LORazepam, ondansetron **OR** ondansetron (ZOFRAN) IV, phenol, potassium chloride, prochlorperazine  Antimicrobials: Anti-infectives (From admission, onward)    Start     Dose/Rate Route Frequency Ordered Stop   10/05/23 1200  piperacillin-tazobactam (ZOSYN) IVPB 3.375 g        3.375 g 12.5 mL/hr over 240 Minutes Intravenous Every 8 hours 10/05/23 0721 10/09/23 1959   10/04/23 2000  piperacillin-tazobactam (ZOSYN) IVPB 3.375 g  Status:  Discontinued        3.375 g 12.5 mL/hr over 240 Minutes Intravenous Every 8 hours 10/04/23 1827 10/05/23 0721   09/27/23 2315  piperacillin-tazobactam (ZOSYN) IVPB  3.375 g  Status:  Discontinued        3.375 g 12.5 mL/hr over 240 Minutes Intravenous Every 8 hours 09/27/23 2305 10/02/23 1106        I have personally reviewed the following labs and images: CBC: Recent Labs  Lab 10/03/23 0345 10/03/23 2124 10/04/23 0359 10/04/23 1239 10/04/23 2021 10/05/23 0247 10/05/23 1016 10/06/23 0312 10/07/23 0313  WBC 16.8*  --  19.4*  --   --   --  19.2* 16.6* 13.9*  NEUTROABS  --   --   --   --   --   --   --   --  11.8*  HGB 8.6*   < > 6.8*   < > 7.0* 7.8* 11.1* 9.6* 10.3*  HCT 25.8*   < > 21.2*    < > 21.4* 24.0* 33.2* 28.8* 32.1*  MCV 94.5  --  96.4  --   --   --  93.3 93.2 95.3  PLT 325  --  263  --   --   --  227 212 250   < > = values in this interval not displayed.   BMP &GFR Recent Labs  Lab 10/03/23 0345 10/04/23 0359 10/05/23 0247 10/06/23 0312 10/07/23 0313  NA 137 140 140 143 140  K 4.3 4.0 3.3* 3.2* 3.5  CL 105 107 108 107 103  CO2 24 26 26 28 28   GLUCOSE 176* 133* 143* 131* 104*  BUN 30* 30* 30* 27* 27*  CREATININE 0.59 0.58 0.75 0.66 0.72  CALCIUM 8.1* 8.5* 8.1* 8.4* 8.5*  MG 1.8  --  2.2  --  2.0  PHOS 2.0*  --  2.5  --  3.0   Estimated Creatinine Clearance: 48.8 mL/min (by C-G formula based on SCr of 0.72 mg/dL). Liver & Pancreas: Recent Labs  Lab 10/07/23 0313  AST 22  ALT 23  ALKPHOS 90  BILITOT 1.4*  PROT 5.7*  ALBUMIN 2.3*   No results for input(s): "LIPASE", "AMYLASE" in the last 168 hours. No results for input(s): "AMMONIA" in the last 168 hours. Diabetic: No results for input(s): "HGBA1C" in the last 72 hours. Recent Labs  Lab 10/06/23 2039 10/06/23 2317 10/07/23 0437 10/07/23 0757 10/07/23 1136  GLUCAP 91 95 84 102* 93   Cardiac Enzymes: No results for input(s): "CKTOTAL", "CKMB", "CKMBINDEX", "TROPONINI" in the last 168 hours. No results for input(s): "PROBNP" in the last 8760 hours. Coagulation Profile: Recent Labs  Lab 10/05/23 0247  INR 1.1   Thyroid Function Tests: No results for input(s): "TSH", "T4TOTAL", "FREET4", "T3FREE", "THYROIDAB" in the last 72 hours. Lipid Profile: No results for input(s): "CHOL", "HDL", "LDLCALC", "TRIG", "CHOLHDL", "LDLDIRECT" in the last 72 hours. Anemia Panel: Recent Labs    10/04/23 1239  VITAMINB12 3,343*  FOLATE 11.2  FERRITIN 166  TIBC 228*  IRON 22*  RETICCTPCT 4.4*   Urine analysis:    Component Value Date/Time   COLORURINE BROWN (A) 10/04/2023 0303   APPEARANCEUR TURBID (A) 10/04/2023 0303   LABSPEC >1.030 (H) 10/04/2023 0303   PHURINE 6.5 10/04/2023 0303    GLUCOSEU NEGATIVE 10/04/2023 0303   HGBUR LARGE (A) 10/04/2023 0303   BILIRUBINUR SMALL (A) 10/04/2023 0303   KETONESUR NEGATIVE 10/04/2023 0303   PROTEINUR >300 (A) 10/04/2023 0303   UROBILINOGEN 0.2 06/20/2009 1445   NITRITE POSITIVE (A) 10/04/2023 0303   LEUKOCYTESUR TRACE (A) 10/04/2023 0303   Sepsis Labs: Invalid input(s): "PROCALCITONIN", "LACTICIDVEN"  Microbiology: Recent Results (from the past 240 hour(s))  Surgical pcr screen     Status: Abnormal   Collection Time: 10/01/23  4:19 AM   Specimen: Nasal Mucosa; Nasal Swab  Result Value Ref Range Status   MRSA, PCR POSITIVE (A) NEGATIVE Final    Comment: RESULT CALLED TO, READ BACK BY AND VERIFIED WITH: NGUYEN, T. RN AT [phone] ON 10/01/2023 BY MECIAL J.    Staphylococcus aureus POSITIVE (A) NEGATIVE Final    Comment: (NOTE) The Xpert SA Assay (FDA approved for NASAL specimens in patients 40 years of age and older), is one component of a comprehensive surveillance program. It is not intended to diagnose infection nor to guide or monitor treatment. Performed at [facility], [address], [place], [place] [address]     Radiology Studies: No results found.    Elizabeth Odom T. Shamika Pedregon [facility]  If 7PM-7AM, please contact night-coverage [email or web address] 10/07/2023, 12:18 PM

## 2023-10-08 ENCOUNTER — Inpatient Hospital Stay (HOSPITAL_COMMUNITY): Payer: PPO

## 2023-10-08 DIAGNOSIS — K56609 Unspecified intestinal obstruction, unspecified as to partial versus complete obstruction: Secondary | ICD-10-CM | POA: Diagnosis not present

## 2023-10-08 LAB — CBC
HCT: 34.3 % — ABNORMAL LOW (ref 36.0–46.0)
Hemoglobin: 11.1 g/dL — ABNORMAL LOW (ref 12.0–15.0)
MCH: 31 pg (ref 26.0–34.0)
MCHC: 32.4 g/dL (ref 30.0–36.0)
MCV: 95.8 fL (ref 80.0–100.0)
Platelets: 293 10*3/uL (ref 150–400)
RBC: 3.58 MIL/uL — ABNORMAL LOW (ref 3.87–5.11)
RDW: 15.8 % — ABNORMAL HIGH (ref 11.5–15.5)
WBC: 12.6 10*3/uL — ABNORMAL HIGH (ref 4.0–10.5)
nRBC: 0 % (ref 0.0–0.2)

## 2023-10-08 LAB — TYPE AND SCREEN
ABO/RH(D): A POS
Antibody Screen: NEGATIVE
Unit division: 0
Unit division: 0
Unit division: 0
Unit division: 0

## 2023-10-08 LAB — BPAM RBC
Blood Product Expiration Date: 202411142359
Blood Product Expiration Date: 202411142359
Blood Product Expiration Date: 202411142359
Blood Product Expiration Date: 202411112359
ISSUE DATE / TIME: 202410242053
ISSUE DATE / TIME: 202410250227
ISSUE DATE / TIME: 202410240556
ISSUE DATE / TIME: 202410250521
Unit Type and Rh: 6200
Unit Type and Rh: 6200
Unit Type and Rh: 6200
Unit Type and Rh: 6200

## 2023-10-08 LAB — COMPREHENSIVE METABOLIC PANEL
ALT: 25 U/L (ref 0–44)
AST: 23 U/L (ref 15–41)
Albumin: 2.4 g/dL — ABNORMAL LOW (ref 3.5–5.0)
Alkaline Phosphatase: 107 U/L (ref 38–126)
Anion gap: 11 (ref 5–15)
BUN: 26 mg/dL — ABNORMAL HIGH (ref 8–23)
CO2: 26 mmol/L (ref 22–32)
Calcium: 8.5 mg/dL — ABNORMAL LOW (ref 8.9–10.3)
Chloride: 101 mmol/L (ref 98–111)
Creatinine, Ser: 0.63 mg/dL (ref 0.44–1.00)
GFR, Estimated: >60 mL/min (ref >60)
Glucose, Bld: 96 mg/dL (ref 70–99)
Potassium: 3.4 mmol/L — ABNORMAL LOW (ref 3.5–5.1)
Sodium: 138 mmol/L (ref 135–145)
Total Bilirubin: 1.6 mg/dL — ABNORMAL HIGH (ref 0.3–1.2)
Total Protein: 5.8 g/dL — ABNORMAL LOW (ref 6.5–8.1)

## 2023-10-08 LAB — GLUCOSE, CAPILLARY
Glucose-Capillary: 108 mg/dL — ABNORMAL HIGH (ref 70–99)
Glucose-Capillary: 109 mg/dL — ABNORMAL HIGH (ref 70–99)
Glucose-Capillary: 117 mg/dL — ABNORMAL HIGH (ref 70–99)
Glucose-Capillary: 88 mg/dL (ref 70–99)
Glucose-Capillary: 96 mg/dL (ref 70–99)
Glucose-Capillary: 96 mg/dL (ref 70–99)
Glucose-Capillary: 99 mg/dL (ref 70–99)

## 2023-10-08 LAB — MAGNESIUM: Magnesium: 2.1 mg/dL (ref 1.7–2.4)

## 2023-10-08 MED ORDER — POTASSIUM CHLORIDE 10 MEQ/100ML IV SOLN
10.0000 meq | INTRAVENOUS | Status: AC
Start: 2023-10-08 — End: 2023-10-08
  Administered 2023-10-08 (×2): 10 meq via INTRAVENOUS
  Filled 2023-10-08 (×2): qty 100

## 2023-10-08 NOTE — Progress Notes (Signed)
   7 Days Post-Op Subjective: Pt remains in step down ICU. Somewhat confused. NAEON  Objective: Vital signs in last 24 hours: Temp:  [96.8 F (36 C)-98.4 F (36.9 C)] 98.3 F (36.8 C) (10/28 0500) Pulse Rate:  [39-96] 39 (10/27 1800) Resp:  [15-25] 25 (10/27 1800) BP: (142-159)/(53-63) 159/63 (10/27 1800) SpO2:  [93 %-96 %] 96 % (10/27 1800) Weight:  [63.1 kg] 63.1 kg (10/27 0800)  Assessment/Plan: 82 year old female status post colectomy, on heparin gtt. for pulmonary embolism now with hematoma surrounding small bowel anastomosis and pelvis.  Question of hematuria  #hematuria CT hematuria showed no acute urinary process, lesions, or evidence of active bleeding.  Incidental finding of significant hematoma in pelvis at around small bowel anastomosis. Recommend stack over 10/25 and 10/26 showed concentrated urine but no incidence of hematuria.  Following volume resuscitation urine was clear yellow with no clot material. Urology will sign off at this time.  Please call with questions or acute urologic changes.  Intake/Output from previous day: 10/27 0701 - 10/28 0700 In: -  Out: 2150 [Urine:1350; Emesis/NG output:800]  Intake/Output this shift: No intake/output data recorded.  Physical Exam:  General: Alert and oriented CV: No cyanosis Lungs: equal chest rise Gu: Clear yellow urine  Lab Results: Recent Labs    10/06/23 0312 10/07/23 0313 10/08/23 0318  HGB 9.6* 10.3* 11.1*  HCT 28.8* 32.1* 34.3*   BMET Recent Labs    10/07/23 0313 10/08/23 0318  NA 140 138  K 3.5 3.4*  CL 103 101  CO2 28 26  GLUCOSE 104* 96  BUN 27* 26*  CREATININE 0.72 0.63  CALCIUM 8.5* 8.5*     Studies/Results: No results found.    LOS: 11 days   Elmon Kirschner, NP [facility] Pager: [phone]  10/08/2023, 7:45 AM

## 2023-10-08 NOTE — Progress Notes (Signed)
Physical Therapy Treatment Patient Details Name: Elizabeth Odom MRN: [id number] DOB: 05/13/1941 Today's Date: 10/08/2023   History of Present Illness 82 yo female admitted with acute colitis. Pt underwent ex laparotomy with Hartman's resection and colostomy and small bowel resection 10/21. Hx of Afib, RLS, falls, syncope, collapse, cervical Ca, cervical spine fxs, anemia    PT Comments  Min assist for bed mobility, Min assist of 2 for sit to stand x 2 with RW, pt had posterior lean in standing with both trials, she was unable to come to a neutral position in standing. +2 min A for balance with stand pivot transfer to recliner.     If plan is discharge home, recommend the following: A lot of help with walking and/or transfers;A lot of help with bathing/dressing/bathroom;Assistance with cooking/housework;Help with stairs or ramp for entrance   Can travel by private vehicle     No  Equipment Recommendations  None recommended by PT    Recommendations for Other Services       Precautions / Restrictions Precautions Precautions: Fall Precaution Comments: abd surgery; colostomy; NG tube Restrictions Weight Bearing Restrictions: No     Mobility  Bed Mobility Overal bed mobility: Needs Assistance Bed Mobility: Rolling, Sidelying to Sit Rolling: Contact guard assist Sidelying to sit: Min assist       General bed mobility comments: VCs for log roll technique, min a for sidelying to sit    Transfers Overall transfer level: Needs assistance Equipment used: Rolling walker (2 wheels) Transfers: Sit to/from Stand Sit to Stand: From elevated surface, Min assist, +2 physical assistance, +2 safety/equipment Stand pivot transfers: Min assist, +2 physical assistance, +2 safety/equipment         General transfer comment: verbal cues for hand placement and weight shifting; performed standing x2, pt unable to march in place due to posterior lean, pt unable to come to neutral with verbal  and manual cues; +2 min A for balance for pivoting to recliner    Ambulation/Gait               General Gait Details: deferred 2* poor standing balance   Stairs             Wheelchair Mobility     Tilt Bed    Modified Rankin (Stroke Patients Only)       Balance Overall balance assessment: Needs assistance Sitting-balance support: No upper extremity supported, Feet supported Sitting balance-Leahy Scale: Fair Sitting balance - Comments: static fair Postural control: Posterior lean Standing balance support: Bilateral upper extremity supported, Reliant on assistive device for balance, During functional activity Standing balance-Leahy Scale: Poor Standing balance comment: reliant on UE and external assist                            Cognition Arousal: Alert Behavior During Therapy: WFL for tasks assessed/performed, Flat affect Overall Cognitive Status: Within Functional Limits for tasks assessed                                          Exercises      General Comments        Pertinent Vitals/Pain Pain Assessment Pain Assessment: No/denies pain Pain Score: 0-No pain    Home Living  Prior Function            PT Goals (current goals can now be found in the care plan section) Acute Rehab PT Goals 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[employer] PT Goal Formulation: With patient Time For Goal Achievement: 10/16/23 Potential to Achieve Goals: Good Progress towards PT goals: Progressing toward goals    Frequency    Min 1X/week      PT Plan      Co-evaluation              AM-PAC PT "6 Clicks" Mobility   Outcome Measure  Help needed turning from your back to your side while in a flat bed without using bedrails?: A Little Help needed moving from lying on your back to sitting on the side of a flat bed without using bedrails?: A Little Help needed  moving to and from a bed to a chair (including a wheelchair)?: A Lot Help needed standing up from a chair using your arms (e.g., wheelchair or bedside chair)?: A Little Help needed to walk in hospital room?: Total Help needed climbing 3-5 steps with a railing? : Total 6 Click Score: 13    End of Session   Activity Tolerance: Patient tolerated treatment well Patient left: with call bell/phone within reach;in chair;with nursing/sitter in room Nurse Communication: Mobility status PT Visit Diagnosis: Difficulty in walking, not elsewhere classified (R26.2);Muscle weakness (generalized) (M62.81)     Time: 1127-1201 PT Time Calculation (min) (ACUTE ONLY): 34 min  Charges:    $Therapeutic Activity: 23-37 mins PT General Charges $$ ACUTE PT VISIT: 1 Visit                     Tamala Ser PT 10/08/2023  Acute Rehabilitation Services  Office [phone]

## 2023-10-08 NOTE — Progress Notes (Signed)
  Daily Progress Note   Patient Name: Elizabeth Odom       Date: 10/08/2023 DOB: 1940-12-18  Age: 83 y.o. MRN#: [id number] Attending Physician: Azucena Fallen, MD Primary Care Physician: [name], FNP Admit Date: 09/27/2023 Length of Stay: 11 days  Reviewed EMR and discussed care with RN. Patient has medically improving overall. Code status is already DNR/DNI. Allowing time for outcomes. PMT will follow along peripherally. Please reach out if acute PMT needs arise.   Alvester Morin, DO Palliative Care Provider PMT # [phone]

## 2023-10-08 NOTE — Plan of Care (Signed)
  Problem: Coping: Goal: Ability to adjust to condition or change in health will improve Outcome: Progressing   Problem: Fluid Volume: Goal: Ability to maintain a balanced intake and output will improve Outcome: Progressing   Problem: Metabolic: Goal: Ability to maintain appropriate glucose levels will improve Outcome: Progressing   Problem: Skin Integrity: Goal: Risk for impaired skin integrity will decrease Outcome: Progressing   Problem: Clinical Measurements: Goal: Respiratory complications will improve Outcome: Progressing Goal: Cardiovascular complication will be avoided Outcome: Progressing   Problem: Nutritional: Goal: Maintenance of adequate nutrition will improve Outcome: Not Progressing   Problem: Nutrition: Goal: Adequate nutrition will be maintained Outcome: Not Progressing   Problem: Coping: Goal: Level of anxiety will decrease Outcome: Not Progressing

## 2023-10-08 NOTE — Plan of Care (Signed)
  Problem: Education: Goal: Ability to describe self-care measures that may prevent or decrease complications (Diabetes Survival Skills Education) will improve Outcome: Progressing   Problem: Coping: Goal: Ability to adjust to condition or change in health will improve Outcome: Progressing   Problem: Fluid Volume: Goal: Ability to maintain a balanced intake and output will improve Outcome: Progressing   Problem: Metabolic: Goal: Ability to maintain appropriate glucose levels will improve Outcome: Progressing

## 2023-10-08 NOTE — Progress Notes (Signed)
PROGRESS NOTE    Elizabeth Odom  XBM:[id number] DOB: May 07, 1941 DOA: 09/27/2023 PCP: [name], FNP   Brief Narrative:  82 year old F with PMH of A-fib, RLS and anemia presented to ED on 10/17 with recurrent abdominal pain, nausea, emesis, diarrhea and intolerance of oral intake.  Patient was seen in ED on 10/10 for abdominal pain, decreased appetite and fever and discharged on Augmentin for possible colitis noted on CT abdomen and pelvis.  However, she has recurrence of his symptoms after interval improvement and presented back to the hospital, hospitalist called for admission, GI and general surgery called for consult.   CT initially showed proximal sigmoid colon wall thickening with upstream colonic dilation with air-fluid levels and large amount of stool compatible with obstruction.  There is also a 9 mm cystic lesion in the tail of pancreas, and subcentimeter lucent lesion in L1 vertebral body.  GI consulted, patient refused colonoscopy but agreed to sigmoidoscopy showing stricture versus mass at the rectosigmoid region -scope could not be advanced past this position.  Surgery consulted -status post ex lap with small bowel resection and Hartman's resection and colostomy on 10/01/2023.   Hospital course complicated by acute PE and LLE DVT for which she was started on heparin -after which she unfortunately developed ABLA with pelvic hematoma near the small bowel anastomosis as well as notable hematuria.  Urology consulted, subsequently signed off.  Given need for anticoagulation but high risk of bleed patient was evaluated by IR who placed IVC filter on 10/05/2023.  Patient required total of 4 unit of blood transfusion, hemoglobin now stabilizing.  Assessment & Plan:   Principal Problem:   Large bowel obstruction (HCC) Active Problems:   Acute colitis   Left leg DVT (HCC)   ABLA (acute blood loss anemia)   Palliative care encounter   Counseling and coordination of care   Need for  emotional support   Goals of care, counseling/discussion   Large bowel obstruction -10/17-CT with proximal sigmoid colon thickening -10/19-sigmoidoscopy with very tight stricture -mass biopsy negative for malignancy as suggesting inflammation -10/21-s/p s/p ex lap Hartman's resection, small bowel resection and ostomy.  -General Surgery following -holding anticoagulation given bleeding risk as below -Pain currently well-controlled -Advance diet per general surgery   Postoperative ileus vs partial SBO -Exacerbated by intra-abdominal bleeding. -NG tube/feeds per surgery as above   Postoperative ABLA  Secondary to pelvic/peri-anastomotic hematomas Hematuria:  -Transfused 4 units total, last transfusion 10/25 -Hemoglobin stabilizing, status post IVC filter, no further anticoagulation at this time -Urology consulted, no indication for cystoscopy or intervention, urology signing off   Acute PE w/o right heart strain/acute left leg DVT:  -Unable to tolerate anticoagulation given above, IVC filter placed 10/25   Pancreatic tail cyst:  -9 mm pancreatic tail cyst noted on CT abdomen and pelvis. -Pre and post current abdominal MRI in 1 year recommended   Nodule in the inferior aspect of the isthmus  -will need thyroid ultrasound not emergently -follow-up outpatient  Chronic HFpEF:  -TTE with EF 70% grade 1 diastolic dysfunction.   -Wean IV fluids as p.o. intake improves -Strict intake and output, follow daily weights, renal functions and electrolyte   Paroxysmal  atrial fibrillation:  -Rate controlled.  Not on meds PTA. -Telemetry monitoring -Optimize electrolytes -No anticoagulation given above   Hyperglycemia/prediabetes: A1c 5.8%. -Monitor with daily labs   Hyponatremia: Resolved   Hypokalemia: -Monitor replenish as appropriate   Leukocytosis: Seems persistent.  Likely due to #1 -Continue monitoring  Goals of care: Changed to DNR/DNI 10/24. -Palliative medicine on  board   Body mass index is 23.15 kg/m.   DVT prophylaxis: Place and maintain sequential compression device Start: 10/06/23 1319   Code Status:   Code Status: Limited: Do not attempt resuscitation (DNR) -DNR-LIMITED -Do Not Intubate/DNI   Family Communication: None present  Status is: Inpatient  Dispo: The patient is from: Home              Anticipated d/c is to: To be determined              Anticipated d/c date is: 48 to 72 hours              Patient currently not medically stable for discharge  Consultants:  GI, general surgery, interventional radiology, palliative care, urology  Procedures:  10/19 -sigmoidoscopy 10/21 small bowel resection, ostomy, Hartman's resection 10/25 IVC filter placed  Antimicrobials:  Zosyn  Subjective: No acute issues or events overnight denies nausea vomiting diarrhea constipation hydroceles or chest pain  Objective: Vitals:   10/07/23 2007 10/08/23 0015 10/08/23 0314 10/08/23 0500  BP:      Pulse:      Resp:      Temp: 97.9 F (36.6 C) 98.4 F (36.9 C) (!) 96.8 F (36 C) 98.3 F (36.8 C)  TempSrc: Oral Axillary Axillary Oral  SpO2:      Weight:      Height:        Intake/Output Summary (Last 24 hours) at 10/08/2023 0723 Last data filed at 10/08/2023 0549 Gross per 24 hour  Intake --  Output 2150 ml  Net -2150 ml   Filed Weights   10/05/23 0500 10/06/23 0746 10/07/23 0800  Weight: 62 kg 62.8 kg 63.1 kg    Examination:  General:  Pleasantly resting in bed, No acute distress. HEENT:  Normocephalic atraumatic.  NG to suction dark green fluid noted Neck:  Without mass or deformity.  Trachea is midline. Lungs:  Clear to auscultate bilaterally without rhonchi, wheeze, or rales. Heart:  Regular rate and rhythm.  Without murmurs, rubs, or gallops. Abdomen:  Soft, nontender, nondistended.  Ostomy without erythema or leak. Extremities: Without cyanosis, clubbing, edema, or obvious deformity. Skin:  Warm and dry, no  erythema.  Data Reviewed: I have personally reviewed following labs and imaging studies  CBC: Recent Labs  Lab 10/04/23 0359 10/04/23 1239 10/05/23 0247 10/05/23 1016 10/06/23 0312 10/07/23 0313 10/08/23 0318  WBC 19.4*  --   --  19.2* 16.6* 13.9* 12.6*  NEUTROABS  --   --   --   --   --  11.8*  --   HGB 6.8*   < > 7.8* 11.1* 9.6* 10.3* 11.1*  HCT 21.2*   < > 24.0* 33.2* 28.8* 32.1* 34.3*  MCV 96.4  --   --  93.3 93.2 95.3 95.8  PLT 263  --   --  227 212 250 293   < > = values in this interval not displayed.   Basic Metabolic Panel: Recent Labs  Lab 10/03/23 0345 10/04/23 0359 10/05/23 0247 10/06/23 0312 10/07/23 0313 10/08/23 0318  NA 137 140 140 143 140 138  K 4.3 4.0 3.3* 3.2* 3.5 3.4*  CL 105 107 108 107 103 101  CO2 24 26 26 28 28 26   GLUCOSE 176* 133* 143* 131* 104* 96  BUN 30* 30* 30* 27* 27* 26*  CREATININE 0.59 0.58 0.75 0.66 0.72 0.63  CALCIUM 8.1* 8.5* 8.1* 8.4* 8.5*  8.5*  MG 1.8  --  2.2  --  2.0 2.1  PHOS 2.0*  --  2.5  --  3.0  --    GFR: Estimated Creatinine Clearance: 48.8 mL/min (by C-G formula based on SCr of 0.63 mg/dL). Liver Function Tests: Recent Labs  Lab 10/07/23 0313 10/08/23 0318  AST 22 23  ALT 23 25  ALKPHOS 90 107  BILITOT 1.4* 1.6*  PROT 5.7* 5.8*  ALBUMIN 2.3* 2.4*   No results for input(s): "LIPASE", "AMYLASE" in the last 168 hours. No results for input(s): "AMMONIA" in the last 168 hours. Coagulation Profile: Recent Labs  Lab 10/05/23 0247  INR 1.1   Cardiac Enzymes: No results for input(s): "CKTOTAL", "CKMB", "CKMBINDEX", "TROPONINI" in the last 168 hours. BNP (last 3 results) No results for input(s): "PROBNP" in the last 8760 hours. HbA1C: No results for input(s): "HGBA1C" in the last 72 hours. CBG: Recent Labs  Lab 10/07/23 1136 10/07/23 1615 10/07/23 2007 10/08/23 0014 10/08/23 0314  GLUCAP 93 98 113* 96 109*   Lipid Profile: No results for input(s): "CHOL", "HDL", "LDLCALC", "TRIG", "CHOLHDL",  "LDLDIRECT" in the last 72 hours. Thyroid Function Tests: No results for input(s): "TSH", "T4TOTAL", "FREET4", "T3FREE", "THYROIDAB" in the last 72 hours. Anemia Panel: No results for input(s): "VITAMINB12", "FOLATE", "FERRITIN", "TIBC", "IRON", "RETICCTPCT" in the last 72 hours. Sepsis Labs: No results for input(s): "PROCALCITON", "LATICACIDVEN" in the last 168 hours.  Recent Results (from the past 240 hour(s))  Surgical pcr screen     Status: Abnormal   Collection Time: 10/01/23  4:19 AM   Specimen: Nasal Mucosa; Nasal Swab  Result Value Ref Range Status   MRSA, PCR POSITIVE (A) NEGATIVE Final    Comment: RESULT CALLED TO, READ BACK BY AND VERIFIED WITH: NGUYEN, T. RN AT [phone] ON 10/01/2023 BY MECIAL J.    Staphylococcus aureus POSITIVE (A) NEGATIVE Final    Comment: (NOTE) The Xpert SA Assay (FDA approved for NASAL specimens in patients 58 years of age and older), is one component of a comprehensive surveillance program. It is not intended to diagnose infection nor to guide or monitor treatment. Performed at [facility], [address] [place], [place] [address]     Radiology Studies: No results found.  Scheduled Meds:  bisacodyl  10 mg Rectal Once   Chlorhexidine Gluconate Cloth  6 each Topical Daily   insulin aspart  0-9 Units Subcutaneous Q4H   Continuous Infusions:  piperacillin-tazobactam (ZOSYN)  IV 3.375 g (10/08/23 0457)   potassium chloride 10 mEq (10/08/23 0657)     LOS: 11 days   Time spent:  Azucena Fallen, DO [facility]  If 7PM-7AM, please contact night-coverage [email or web address]  10/08/2023, 7:23 AM

## 2023-10-08 NOTE — Progress Notes (Signed)
7 Days Post-Op   Subjective/Chief Complaint: Complains of some abdominal pain NG with 1000 cc out but taking a lot of ice   Objective: Vital signs in last 24 hours: Temp:  [96.8 F (36 C)-99.1 F (37.3 C)] 99.1 F (37.3 C) (10/28 0800) Pulse Rate:  [39-69] 39 (10/27 1800) Resp:  [16-25] 25 (10/27 1800) BP: (142-159)/(53-63) 159/63 (10/27 1800) SpO2:  [93 %-96 %] 96 % (10/27 1800) Last BM Date : 10/05/23  Intake/Output from previous day: 10/27 0701 - 10/28 0700 In: -  Out: 2350 [Urine:1350; Emesis/NG output:1000] Intake/Output this shift: No intake/output data recorded.  Exam: Awake and alert Abdomen a little full put minimally tender Ostomy viable Gas and small amount of liquid stool in the bag  Lab Results:  Recent Labs    10/07/23 0313 10/08/23 0318  WBC 13.9* 12.6*  HGB 10.3* 11.1*  HCT 32.1* 34.3*  PLT 250 293   BMET Recent Labs    10/07/23 0313 10/08/23 0318  NA 140 138  K 3.5 3.4*  CL 103 101  CO2 28 26  GLUCOSE 104* 96  BUN 27* 26*  CREATININE 0.72 0.63  CALCIUM 8.5* 8.5*   PT/INR No results for input(s): "LABPROT", "INR" in the last 72 hours. ABG No results for input(s): "PHART", "HCO3" in the last 72 hours.  Invalid input(s): "PCO2", "PO2"  Studies/Results: No results found.  Anti-infectives: Anti-infectives (From admission, onward)    Start     Dose/Rate Route Frequency Ordered Stop   10/05/23 1200  piperacillin-tazobactam (ZOSYN) IVPB 3.375 g        3.375 g 12.5 mL/hr over 240 Minutes Intravenous Every 8 hours 10/05/23 0721 10/09/23 1959   10/04/23 2000  piperacillin-tazobactam (ZOSYN) IVPB 3.375 g  Status:  Discontinued        3.375 g 12.5 mL/hr over 240 Minutes Intravenous Every 8 hours 10/04/23 1827 10/05/23 0721   09/27/23 2315  piperacillin-tazobactam (ZOSYN) IVPB 3.375 g  Status:  Discontinued        3.375 g 12.5 mL/hr over 240 Minutes Intravenous Every 8 hours 09/27/23 2305 10/02/23 1106       Assessment/Plan: s/p  ex lap, Hartman's resection and colostomy with small bowel resection, Dr. [name] 10/01/23 for diverticular stricture -post-operative ileus - likely exacerbated by intra-abdominal bleeding secondary to anticoagulation with hematoma around the SB anastomosis  Continue NG WBC down further Hgb stable    Abigail Miyamoto MD 10/08/2023

## 2023-10-09 DIAGNOSIS — K56609 Unspecified intestinal obstruction, unspecified as to partial versus complete obstruction: Secondary | ICD-10-CM | POA: Diagnosis not present

## 2023-10-09 LAB — GLUCOSE, CAPILLARY
Glucose-Capillary: 103 mg/dL — ABNORMAL HIGH (ref 70–99)
Glucose-Capillary: 109 mg/dL — ABNORMAL HIGH (ref 70–99)
Glucose-Capillary: 119 mg/dL — ABNORMAL HIGH (ref 70–99)
Glucose-Capillary: 116 mg/dL — ABNORMAL HIGH (ref 70–99)
Glucose-Capillary: 105 mg/dL — ABNORMAL HIGH (ref 70–99)
Glucose-Capillary: 99 mg/dL (ref 70–99)

## 2023-10-09 NOTE — Consult Note (Addendum)
WOC Nurse ostomy follow up Surgical team following for assessment and plan of care for abd wound.   Pt is currently in ICU and did not watch pouch change or assist or ask questions.  She will require total assistance with pouch application and emptying after discharge.  Stoma type/location: Stoma is red and viable, 1 1/4 inches, slightly above skin level.  Peristomal assessment: intact Output: 50cc semiformed brown stool Ostomy pouching: 1pc; applied barrier ring and one piece flat pouch. WOC team will follow next week to determine if patient is ready to be involved in ostomy teaching sessions.  4 sets of each supply left at the bedside.  Use barrier rings, Hart [place] # [phone] and one piece pouches, Hart [place] # [id number] Enrolled patient in [employer] Discharge program: Yes, previously Thank-you,  Cammie Mcgee MSN, RN, CWOCN, [place], CNS [phone]

## 2023-10-09 NOTE — Plan of Care (Signed)
   Problem: Coping: Goal: Ability to adjust to condition or change in health will improve Outcome: Progressing   Problem: Metabolic: Goal: Ability to maintain appropriate glucose levels will improve Outcome: Progressing   Problem: Education: Goal: Knowledge of General Education information will improve Description: Including pain rating scale, medication(s)/side effects and non-pharmacologic comfort measures Outcome: Progressing

## 2023-10-09 NOTE — Progress Notes (Signed)
OT Cancellation Note  Patient Details Name: CAMIYAH STOPHEL MRN: [id number] DOB: 02-03-1941   Cancelled Treatment:    Reason Eval/Treat Not Completed: Other (comment) Patient reported fatigue this AM and "wanting to get swallow right" first. Nurse and MD made aware. OT to continue to follow and check back as schedule will allow.  Rosalio Loud, MS Acute Rehabilitation Department Office# [phone]  10/09/2023, 9:03 AM

## 2023-10-09 NOTE — Progress Notes (Signed)
Occupational Therapy Treatment Patient Details Name: Elizabeth Odom MRN: [id number] DOB: 08-21-1941 Today's Date: 10/09/2023   History of present illness Patient is a 82 year old female who presented on 10/17 with acute colitis. on10/21, patient underwent ex laparotomy with hartman's resection and colostomy and small bowel resection. PMH: a fib, RLS, syncope, cervical CA, cervical spine fusions, anemia   OT comments  Therapy was reordered by MD today with patient at same level of functioning compared to evaluation on 10/23 with fatigue, fear of emesis, and pain impacting participation. Patient continues to have decreased standing balance/tolerance, decreased functional activity tolerance, and increased pain. Patient was also limited with drop in BP as noted below with standing. Patient will benefit from continued inpatient follow up therapy, <3 hours/day       If plan is discharge home, recommend the following:  A lot of help with bathing/dressing/bathroom;Assistance with cooking/housework;Assist for transportation;A lot of help with walking and/or transfers   Equipment Recommendations  None recommended by OT       Precautions / Restrictions Precautions Precautions: Fall Precaution Comments: abd surgery; colostomy; NG tube Restrictions Weight Bearing Restrictions: No Other Position/Activity Restrictions: abdominal surgery, colostomy       Mobility Bed Mobility               General bed mobility comments: patient was up in recliner and returned to the same.         Balance Overall balance assessment: Needs assistance Sitting-balance support: No upper extremity supported, Feet supported Sitting balance-Leahy Scale: Fair     Standing balance support: Bilateral upper extremity supported, Reliant on assistive device for balance, During functional activity Standing balance-Leahy Scale: Poor                             ADL either performed or assessed with  clinical judgement   ADL Overall ADL's : Needs assistance/impaired   Eating/Feeding Details (indicate cue type and reason): patient is NPO but noted to have soda in room. patient reported MD is giving her trial for this. patient asked how much she is allowed to have. deferred to nurse at this time. nurse made aware.   Grooming Details (indicate cue type and reason): patient reported having completed these tasks this AM already and not wanting to risk throwing up.                               General ADL Comments: patient reported dizziness sititng upright in chair. bp was 144/75 mmhg. patient in standing BP was taken with BP of 118/75 mmhg with patietn reporting increased dizziness. patient returned to recliner with BLE up. patient endorsed dizziness being less sitting in recliner with BLE elevated. patient was educaetd on akle pumps and arm movements to participate in during the day. patient verbalzied understanding.      Cognition Arousal: Alert Behavior During Therapy: WFL for tasks assessed/performed, Flat affect Overall Cognitive Status: Within Functional Limits for tasks assessed         General Comments: able to follow commands.                   Pertinent Vitals/ Pain       Pain Assessment Pain Assessment: Faces Faces Pain Scale: Hurts a little bit Pain Location: abdomen Pain Descriptors / Indicators: Grimacing, Operative site guarding Pain Intervention(s): Limited activity within patient's tolerance, Monitored during  session         Frequency  Min 1X/week        Progress Toward Goals  OT Goals(current goals can now be found in the care plan section)  Progress towards OT goals: Not progressing toward goals - comment (dizziness impacting session)     Plan         AM-PAC OT "6 Clicks" Daily Activity     Outcome Measure   Help from another person eating meals?: A Little Help from another person taking care of personal grooming?: A  Little Help from another person toileting, which includes using toliet, bedpan, or urinal?: A Lot Help from another person bathing (including washing, rinsing, drying)?: A Lot Help from another person to put on and taking off regular upper body clothing?: A Little Help from another person to put on and taking off regular lower body clothing?: A Lot 6 Click Score: 15    End of Session Equipment Utilized During Treatment: Rolling walker (2 wheels)  OT Visit Diagnosis: Muscle weakness (generalized) (M62.81);Pain   Activity Tolerance Patient limited by pain   Patient Left with call bell/phone within reach;in chair;with chair alarm set   Nurse Communication Mobility status;Other (comment) (BP during session)        Time: 1211-1222 OT Time Calculation (min): 11 min  Charges: OT General Charges $OT Visit: 1 Visit OT Treatments $Self Care/Home Management : 8-22 mins  Rosalio Loud, MS Acute Rehabilitation Department Office# [phone]   Selinda Flavin 10/09/2023, 1:12 PM

## 2023-10-09 NOTE — Progress Notes (Signed)
8 Days Post-Op   Subjective/Chief Complaint: No acute changes over night Has had minimal ostomy output   Objective: Vital signs in last 24 hours: Temp:  [97.5 F (36.4 C)-98.3 F (36.8 C)] 98 F (36.7 C) (10/29 0400) Pulse Rate:  [32-137] 93 (10/29 0500) Resp:  [10-26] 21 (10/29 0500) BP: (100-160)/(64-107) 136/66 (10/29 0500) SpO2:  [91 %-96 %] 93 % (10/29 0500) Weight:  [59.2 kg] 59.2 kg (10/29 0500) Last BM Date : 10/05/23  Intake/Output from previous day: 10/28 0701 - 10/29 0700 In: 200 [IV Piggyback:200] Out: 1875 [Urine:850; Emesis/NG output:1025] Intake/Output this shift: Total I/O In: -  Out: 50 [Stool:50]  Exam: Awake and alert Abdomen soft, ostomy pink, bag just changed  Lab Results:  Recent Labs    10/07/23 0313 10/08/23 0318  WBC 13.9* 12.6*  HGB 10.3* 11.1*  HCT 32.1* 34.3*  PLT 250 293   BMET Recent Labs    10/07/23 0313 10/08/23 0318  NA 140 138  K 3.5 3.4*  CL 103 101  CO2 28 26  GLUCOSE 104* 96  BUN 27* 26*  CREATININE 0.72 0.63  CALCIUM 8.5* 8.5*   PT/INR No results for input(s): "LABPROT", "INR" in the last 72 hours. ABG No results for input(s): "PHART", "HCO3" in the last 72 hours.  Invalid input(s): "PCO2", "PO2"  Studies/Results: DG Abd 1 View  Result Date: 10/08/2023 CLINICAL DATA:  Nasogastric tube placement. EXAM: ABDOMEN - 1 VIEW COMPARISON:  Radiograph 10/06/2023 FINDINGS: Tip and side port of the enteric tube below the diaphragm in the stomach. Few prominent air-filled loops of small bowel in the pelvis. IVC filter in place. IMPRESSION: Tip and side port of the enteric tube below the diaphragm in the stomach. Electronically Signed   By: Narda Rutherford M.D.   On: 10/08/2023 09:59    Anti-infectives: Anti-infectives (From admission, onward)    Start     Dose/Rate Route Frequency Ordered Stop   10/05/23 1200  piperacillin-tazobactam (ZOSYN) IVPB 3.375 g        3.375 g 12.5 mL/hr over 240 Minutes Intravenous Every 8  hours 10/05/23 0721 10/09/23 1959   10/04/23 2000  piperacillin-tazobactam (ZOSYN) IVPB 3.375 g  Status:  Discontinued        3.375 g 12.5 mL/hr over 240 Minutes Intravenous Every 8 hours 10/04/23 1827 10/05/23 0721   09/27/23 2315  piperacillin-tazobactam (ZOSYN) IVPB 3.375 g  Status:  Discontinued        3.375 g 12.5 mL/hr over 240 Minutes Intravenous Every 8 hours 09/27/23 2305 10/02/23 1106       Assessment/Plan: s/p ex lap, Hartman's resection and colostomy with small bowel resection, Dr. [name] 10/01/23 for diverticular stricture -post-operative ileus - likely exacerbated by intra-abdominal bleeding secondary to anticoagulation with hematoma around the SB anastomosis  Postop ileus If she does not open up, will check a CT scan of the abdomen and pelvis to evaluate the anastomosis and hematoma  Elizabeth Odom 10/09/2023

## 2023-10-09 NOTE — Progress Notes (Signed)
PROGRESS NOTE    Elizabeth Odom  ZOX:[id number] DOB: [birth year] DOA: 09/27/2023 PCP: [name], FNP   Brief Narrative:  82 year old F with PMH of A-fib, RLS and anemia presented to ED on 10/17 with recurrent abdominal pain, nausea, emesis, diarrhea and intolerance of oral intake.  Patient was seen in ED on 10/10 for abdominal pain, decreased appetite and fever and discharged on Augmentin for possible colitis noted on CT abdomen and pelvis.  However, she has recurrence of his symptoms after interval improvement and presented back to the hospital, hospitalist called for admission, GI and general surgery called for consult.   CT initially showed proximal sigmoid colon wall thickening with upstream colonic dilation with air-fluid levels and large amount of stool compatible with obstruction.  There is also a 9 mm cystic lesion in the tail of pancreas, and subcentimeter lucent lesion in L1 vertebral body.  GI consulted, patient refused colonoscopy but agreed to sigmoidoscopy showing stricture versus mass at the rectosigmoid region -scope could not be advanced past this position.  Surgery consulted -status post ex lap with small bowel resection and Hartman's resection and colostomy on 10/01/2023.  Patient continues to have postop ileus, managed by general surgery.  NG remains in place to suction.  Hospital course complicated by acute PE and LLE DVT for which she was started on heparin -after which she unfortunately developed profound acute blood loss anemia with subsequent pelvic hematoma near the small bowel anastomosis as well as notable hematuria.  Urology consulted, signed off after resolution of hematuria. Given high risk of bleed with notable PE/DVT patient was evaluated by IR who placed IVC filter on 10/05/2023.  Patient required total of 4 unit of blood transfusion, hemoglobin now stabilizing -no indication for further imaging or intervention at this time.  Patient continues to work with PT  OT, patient will likely require placement given her prolonged hospitalization, weakness, and diminished ambulatory status from baseline.  Assessment & Plan:   Principal Problem:   Large bowel obstruction (HCC) Active Problems:   Acute colitis   Left leg DVT (HCC)   ABLA (acute blood loss anemia)   Palliative care encounter   Counseling and coordination of care   Need for emotional support   Goals of care, counseling/discussion   Large bowel obstruction -10/17-CT with proximal sigmoid colon thickening -10/19-sigmoidoscopy with very tight stricture -mass biopsy negative for malignancy as suggesting inflammation -10/21-s/p s/p ex lap Hartman's resection, small bowel resection and ostomy.  -General Surgery following -holding anticoagulation given bleeding risk as below -Pain currently well-controlled -Advance diet per general surgery as tolerated, see below   Postoperative ileus vs partial SBO -Exacerbated by intra-abdominal bleeding. -NG tube per surgery -possible CT abdomen pelvis if no improvement in the next 24 to 48 hours   Acute blood loss anemia secondary to pelvic/peri-anastomotic hematomas Hematuria:  -Transfused 4 units total, last transfusion 10/25 -Hemoglobin stabilizing, status post IVC filter, no further anticoagulation at this time -Urology consulted, no indication for cystoscopy or intervention, urology signing off   Acute PE w/o right heart strain/acute left leg DVT:  -Unable to tolerate anticoagulation given above, IVC filter placed 10/25   Pancreatic tail cyst:  -9 mm pancreatic tail cyst noted on CT abdomen and pelvis. -Pre and post current abdominal MRI in 1 year recommended   Nodule in the inferior aspect of the isthmus  -will need thyroid ultrasound not emergently -follow-up outpatient  Chronic HFpEF:  -TTE with EF 70% grade 1 diastolic dysfunction.   -  Wean IV fluids as p.o. intake improves -Strict intake and output, follow daily weights, renal  functions and electrolyte   Paroxysmal  atrial fibrillation:  -Rate controlled.  Not on meds PTA. -Telemetry monitoring -Optimize electrolytes -No anticoagulation given above   Hyperglycemia/prediabetes: A1c 5.8%. -Monitor with daily labs   Hyponatremia: Resolved   Hypokalemia: -Monitor replenish as appropriate   Leukocytosis: Seems persistent.  Likely due to #1 -Continue monitoring   Goals of care: Changed to DNR/DNI 10/24. -Palliative medicine on board   Body mass index is 23.15 kg/m.   DVT prophylaxis: Place and maintain sequential compression device Start: 10/06/23 1319 Code Status:   Code Status: Limited: Do not attempt resuscitation (DNR) -DNR-LIMITED -Do Not Intubate/DNI  Family Communication: None present  Status is: Inpatient  Dispo: The patient is from: Home              Anticipated d/c is to: To be determined              Anticipated d/c date is: 48 to 72 hours              Patient currently not medically stable for discharge  Consultants:  GI, general surgery, interventional radiology, palliative care, urology  Procedures:  10/19 -sigmoidoscopy 10/21 small bowel resection, ostomy, Hartman's resection 10/25 IVC filter placed  Antimicrobials:  Zosyn  Subjective: No acute issues or events overnight denies nausea vomiting diarrhea constipation hydroceles or chest pain; continues to work with physical therapy, markedly weaker than prior to hospitalization  Objective: Vitals:   10/09/23 0200 10/09/23 0300 10/09/23 0400 10/09/23 0500  BP: 121/78 137/64 129/76 136/66  Pulse: (!) 114 88 (!) 137 93  Resp: 14 16 16  (!) 21  Temp:   98 F (36.7 C)   TempSrc:   Oral   SpO2: 91% 92% 93% 93%  Weight:      Height:        Intake/Output Summary (Last 24 hours) at 10/09/2023 0704 Last data filed at 10/09/2023 0626 Gross per 24 hour  Intake 200 ml  Output 1875 ml  Net -1675 ml   Filed Weights   10/05/23 0500 10/06/23 0746 10/07/23 0800  Weight: 62 kg  62.8 kg 63.1 kg    Examination:  General:  Pleasantly resting in bed, No acute distress. HEENT:  Normocephalic atraumatic.  NG to suction - dark green fluid noted Neck:  Without mass or deformity.  Trachea is midline. Lungs:  Clear to auscultate bilaterally without rhonchi, wheeze, or rales. Heart:  Regular rate and rhythm.  Without murmurs, rubs, or gallops. Abdomen:  Soft, nontender, nondistended.  Ostomy without erythema or leak. Extremities: Without cyanosis, clubbing, edema, or obvious deformity. Skin:  Warm and dry, no erythema.  Data Reviewed: I have personally reviewed following labs and imaging studies  CBC: Recent Labs  Lab 10/04/23 0359 10/04/23 1239 10/05/23 0247 10/05/23 1016 10/06/23 0312 10/07/23 0313 10/08/23 0318  WBC 19.4*  --   --  19.2* 16.6* 13.9* 12.6*  NEUTROABS  --   --   --   --   --  11.8*  --   HGB 6.8*   < > 7.8* 11.1* 9.6* 10.3* 11.1*  HCT 21.2*   < > 24.0* 33.2* 28.8* 32.1* 34.3*  MCV 96.4  --   --  93.3 93.2 95.3 95.8  PLT 263  --   --  227 212 250 293   < > = values in this interval not displayed.   Basic Metabolic Panel: Recent  Labs  Lab 10/03/23 0345 10/04/23 0359 10/05/23 0247 10/06/23 0312 10/07/23 0313 10/08/23 0318  NA 137 140 140 143 140 138  K 4.3 4.0 3.3* 3.2* 3.5 3.4*  CL 105 107 108 107 103 101  CO2 24 26 26 28 28 26   GLUCOSE 176* 133* 143* 131* 104* 96  BUN 30* 30* 30* 27* 27* 26*  CREATININE 0.59 0.58 0.75 0.66 0.72 0.63  CALCIUM 8.1* 8.5* 8.1* 8.4* 8.5* 8.5*  MG 1.8  --  2.2  --  2.0 2.1  PHOS 2.0*  --  2.5  --  3.0  --    GFR: Estimated Creatinine Clearance: 48.8 mL/min (by C-G formula based on SCr of 0.63 mg/dL). Liver Function Tests: Recent Labs  Lab 10/07/23 0313 10/08/23 0318  AST 22 23  ALT 23 25  ALKPHOS 90 107  BILITOT 1.4* 1.6*  PROT 5.7* 5.8*  ALBUMIN 2.3* 2.4*   No results for input(s): "LIPASE", "AMYLASE" in the last 168 hours. No results for input(s): "AMMONIA" in the last 168  hours. Coagulation Profile: Recent Labs  Lab 10/05/23 0247  INR 1.1   CBG: Recent Labs  Lab 10/08/23 1221 10/08/23 1546 10/08/23 1939 10/08/23 2327 10/09/23 0329  GLUCAP 117* 88 108* 99 103*   Recent Results (from the past 240 hour(s))  Surgical pcr screen     Status: Abnormal   Collection Time: 10/01/23  4:19 AM   Specimen: Nasal Mucosa; Nasal Swab  Result Value Ref Range Status   MRSA, PCR POSITIVE (A) NEGATIVE Final    Comment: RESULT CALLED TO, READ BACK BY AND VERIFIED WITH: NGUYEN, T. RN AT [phone] ON 10/01/2023 BY MECIAL J.    Staphylococcus aureus POSITIVE (A) NEGATIVE Final    Comment: (NOTE) The Xpert SA Assay (FDA approved for NASAL specimens in patients 63 years of age and older), is one component of a comprehensive surveillance program. It is not intended to diagnose infection nor to guide or monitor treatment. Performed at [facility], [address] [place], [place] [address]     Radiology Studies: DG Abd 1 View  Result Date: 10/08/2023 CLINICAL DATA:  Nasogastric tube placement. EXAM: ABDOMEN - 1 VIEW COMPARISON:  Radiograph 10/06/2023 FINDINGS: Tip and side port of the enteric tube below the diaphragm in the stomach. Few prominent air-filled loops of small bowel in the pelvis. IVC filter in place. IMPRESSION: Tip and side port of the enteric tube below the diaphragm in the stomach. Electronically Signed   By: Narda Rutherford M.D.   On: 10/08/2023 09:59    Scheduled Meds:  bisacodyl  10 mg Rectal Once   Chlorhexidine Gluconate Cloth  6 each Topical Daily   insulin aspart  0-9 Units Subcutaneous Q4H   Continuous Infusions:  piperacillin-tazobactam (ZOSYN)  IV 3.375 g (10/09/23 0423)     LOS: 12 days   Time spent:  Azucena Fallen, DO [facility]  If 7PM-7AM, please contact night-coverage [email or web address]  10/09/2023, 7:04 AM

## 2023-10-10 DIAGNOSIS — I824Y2 Acute embolism and thrombosis of unspecified deep veins of left proximal lower extremity: Secondary | ICD-10-CM | POA: Diagnosis not present

## 2023-10-10 DIAGNOSIS — D62 Acute posthemorrhagic anemia: Secondary | ICD-10-CM | POA: Diagnosis not present

## 2023-10-10 DIAGNOSIS — K56609 Unspecified intestinal obstruction, unspecified as to partial versus complete obstruction: Secondary | ICD-10-CM | POA: Diagnosis not present

## 2023-10-10 LAB — CBC
HCT: 39.1 % (ref 36.0–46.0)
Hemoglobin: 12.5 g/dL (ref 12.0–15.0)
MCH: 31.3 pg (ref 26.0–34.0)
MCHC: 32 g/dL (ref 30.0–36.0)
MCV: 98 fL (ref 80.0–100.0)
Platelets: 347 10*3/uL (ref 150–400)
RBC: 3.99 MIL/uL (ref 3.87–5.11)
RDW: 15.8 % — ABNORMAL HIGH (ref 11.5–15.5)
WBC: 9.3 10*3/uL (ref 4.0–10.5)
nRBC: 0 % (ref 0.0–0.2)

## 2023-10-10 LAB — BASIC METABOLIC PANEL
Anion gap: 13 (ref 5–15)
BUN: 27 mg/dL — ABNORMAL HIGH (ref 8–23)
CO2: 28 mmol/L (ref 22–32)
Calcium: 8.6 mg/dL — ABNORMAL LOW (ref 8.9–10.3)
Chloride: 100 mmol/L (ref 98–111)
Creatinine, Ser: 0.65 mg/dL (ref 0.44–1.00)
GFR, Estimated: >60 mL/min (ref >60)
Glucose, Bld: 108 mg/dL — ABNORMAL HIGH (ref 70–99)
Potassium: 3.1 mmol/L — ABNORMAL LOW (ref 3.5–5.1)
Sodium: 141 mmol/L (ref 135–145)

## 2023-10-10 LAB — GLUCOSE, CAPILLARY
Glucose-Capillary: 113 mg/dL — ABNORMAL HIGH (ref 70–99)
Glucose-Capillary: 142 mg/dL — ABNORMAL HIGH (ref 70–99)
Glucose-Capillary: 146 mg/dL — ABNORMAL HIGH (ref 70–99)
Glucose-Capillary: 146 mg/dL — ABNORMAL HIGH (ref 70–99)
Glucose-Capillary: 143 mg/dL — ABNORMAL HIGH (ref 70–99)
Glucose-Capillary: 124 mg/dL — ABNORMAL HIGH (ref 70–99)

## 2023-10-10 MED ORDER — IOHEXOL 9 MG/ML PO SOLN
500.0000 mL | ORAL | Status: AC
  Administered 2023-10-10 (×2): 500 mL via ORAL

## 2023-10-10 MED ORDER — POTASSIUM CHLORIDE 10 MEQ/100ML IV SOLN
10.0000 meq | INTRAVENOUS | Status: AC
  Administered 2023-10-10 (×2): 10 meq via INTRAVENOUS
  Filled 2023-10-10 (×2): qty 100

## 2023-10-10 NOTE — Progress Notes (Signed)
OT Cancellation Note  Patient Details Name: Elizabeth Odom MRN: [id number] DOB: Jul 27, 1941   Cancelled Treatment:     Pt drinking contrast for ABD CT.  Rehab staff to attempt to see another day as schedule permits.  Felecia Shelling  PTA Acute  Rehabilitation Services Office M-F          [phone]

## 2023-10-10 NOTE — TOC Progression Note (Signed)
Transition of Care [facility]) - Progression Note    Patient Details  Name: Elizabeth Odom MRN: [id number] Date of Birth: Sep 17, 1941  Transition of Care [facility]) CM/SW Contact  [name] Phone Number: 10/10/2023, 6:43 PM  Clinical Narrative:     CSW continuing to follow patient.  Patient is currently open to [facility] for [facility], patient initially refusing SNF placement.  PT recommending SNF for patient, TOC to follow up at a later time to see if patient is agreeable to SNF.  Expected Discharge Plan: Home w Home Health Services Barriers to Discharge: Continued Medical Work up  Expected Discharge Plan and Services In-house Referral: Clinical Social Work   Post Acute Care Choice: Home Health Living arrangements for the past 2 months: Independent Living Facility, Apartment                 DME Arranged: N/A DME Agency: NA       HH Arranged: PT, RN HH Agency: [facility] Date HH Agency Contacted: 10/02/23 Time HH Agency Contacted: 1550 Representative spoke with at [facility] Agency: Morrie Sheldon   Social Determinants of Health (SDOH) Interventions SDOH Screenings   Food Insecurity: No Food Insecurity (09/28/2023)  Housing: Low Risk  (09/28/2023)  Transportation Needs: No Transportation Needs (09/28/2023)  Utilities: Not At Risk (09/28/2023)  Tobacco Use: Low Risk  (10/04/2023)    Readmission Risk Interventions     No data to display

## 2023-10-10 NOTE — Progress Notes (Signed)
Progress Note  9 Days Post-Op  Subjective: Pt reports today is her birthday and she is waiting on a coke as her treat. She appears comfortable. Some gas from stoma. Good UOP.   Objective: Vital signs in last 24 hours: Temp:  [97.7 F (36.5 C)-98.2 F (36.8 C)] 98.2 F (36.8 C) (10/30 0758) Pulse Rate:  [50-115] 90 (10/30 0704) Resp:  [10-25] 15 (10/30 0704) BP: (109-149)/(58-86) 121/59 (10/30 0704) SpO2:  [90 %-96 %] 94 % (10/30 0704) Weight:  [57 kg] 57 kg (10/30 0500) Last BM Date : 10/05/23  Intake/Output from previous day: 10/29 0701 - 10/30 0700 In: -  Out: 1150 [Urine:100; Emesis/NG output:1000; Stool:50] Intake/Output this shift: No intake/output data recorded.  PE: General: pleasant, WD, thin female who is laying in bed in NAD Heart: irregularly irregular with rate in the 90s Lungs: CTAB, no wheezes, rhonchi, or rales noted.  Respiratory effort nonlabored Abd: soft, appropriately ttp, midline wound clean, stoma viable with air in ostomy bag Psych: A&Ox3 with an appropriate affect.    Lab Results:  Recent Labs    10/08/23 0318 10/10/23 0312  WBC 12.6* 9.3  HGB 11.1* 12.5  HCT 34.3* 39.1  PLT 293 347   BMET Recent Labs    10/08/23 0318 10/10/23 0312  NA 138 141  K 3.4* 3.1*  CL 101 100  CO2 26 28  GLUCOSE 96 108*  BUN 26* 27*  CREATININE 0.63 0.65  CALCIUM 8.5* 8.6*   PT/INR No results for input(s): "LABPROT", "INR" in the last 72 hours. CMP     Component Value Date/Time   NA 141 10/10/2023 0312   K 3.1 (L) 10/10/2023 0312   CL 100 10/10/2023 0312   CO2 28 10/10/2023 0312   GLUCOSE 108 (H) 10/10/2023 0312   BUN 27 (H) 10/10/2023 0312   CREATININE 0.65 10/10/2023 0312   CALCIUM 8.6 (L) 10/10/2023 0312   PROT 5.8 (L) 10/08/2023 0318   ALBUMIN 2.4 (L) 10/08/2023 0318   AST 23 10/08/2023 0318   ALT 25 10/08/2023 0318   ALKPHOS 107 10/08/2023 0318   BILITOT 1.6 (H) 10/08/2023 0318   GFRNONAA >60 10/10/2023 0312   GFRAA >60 04/10/2016  1352   Lipase     Component Value Date/Time   LIPASE 41 09/27/2023 2007       Studies/Results: DG Abd 1 View  Result Date: 10/08/2023 CLINICAL DATA:  Nasogastric tube placement. EXAM: ABDOMEN - 1 VIEW COMPARISON:  Radiograph 10/06/2023 FINDINGS: Tip and side port of the enteric tube below the diaphragm in the stomach. Few prominent air-filled loops of small bowel in the pelvis. IVC filter in place. IMPRESSION: Tip and side port of the enteric tube below the diaphragm in the stomach. Electronically Signed   By: Narda Rutherford M.D.   On: 10/08/2023 09:59    Anti-infectives: Anti-infectives (From admission, onward)    Start     Dose/Rate Route Frequency Ordered Stop   10/05/23 1200  piperacillin-tazobactam (ZOSYN) IVPB 3.375 g        3.375 g 12.5 mL/hr over 240 Minutes Intravenous Every 8 hours 10/05/23 0721 10/09/23 1716   10/04/23 2000  piperacillin-tazobactam (ZOSYN) IVPB 3.375 g  Status:  Discontinued        3.375 g 12.5 mL/hr over 240 Minutes Intravenous Every 8 hours 10/04/23 1827 10/05/23 0721   09/27/23 2315  piperacillin-tazobactam (ZOSYN) IVPB 3.375 g  Status:  Discontinued        3.375 g 12.5 mL/hr over 240 Minutes  Intravenous Every 8 hours 09/27/23 2305 10/02/23 1106        Assessment/Plan  POD9 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 [name] - CT 10/24 with hematoma in the pelvis adjacent to small bowel anastomosis and ileus vs obstruction  - having some gas via stoma but NGT output remains at 1L  - midline wound clean  - no leukocytosis, completed 5 d zosyn  FEN: NPO (ok to have Coke with NGT for her birthday), NGT to LIWS VTE: IVC Filter  ID: no current abx  - per TRH -  ABL anemia secondary to post-op hematomas and hematuria  Acute PE and LLE DVT Pancreatic tail cyst Thyroid nodule  Chronic HFpEF Paroxysmal A. Fib Pre-diabetes RLS  LOS: 13 days    Juliet Rude, [facility] 10/10/2023,  8:42 AM Please see Amion for pager number during day hours 7:00am-4:30pm

## 2023-10-10 NOTE — Progress Notes (Signed)
[facility]                                                                               Elizabeth Odom, is a 82 y.o. female, DOB - 08-23-1941, ZOX:[id number] Admit date - 09/27/2023    Outpatient Primary MD for the patient is Elizabeth Pilon, FNP  LOS - 13  days    Brief summary   83 year old F with PMH of A-fib, RLS and anemia presented to ED on 10/17 with recurrent abdominal pain, nausea, emesis, diarrhea and intolerance of oral intake.  Patient was seen in ED on 10/10 for abdominal pain, decreased appetite and fever and discharged on Augmentin for possible colitis noted on CT abdomen and pelvis.  However, she has recurrence of his symptoms after interval improvement and presented back to the hospital, hospitalist called for admission, GI and general surgery called for consult.   CT initially showed proximal sigmoid colon wall thickening with upstream colonic dilation with air-fluid levels and large amount of stool compatible with obstruction.  There is also a 9 mm cystic lesion in the tail of pancreas, and subcentimeter lucent lesion in L1 vertebral body.  GI consulted, patient refused colonoscopy but agreed to sigmoidoscopy showing stricture versus mass at the rectosigmoid region -scope could not be advanced past this position.  Surgery consulted -status post ex lap with small bowel resection and Hartman's resection and colostomy on 10/01/2023.  Patient continues to have postop ileus, managed by general surgery.  NG remains in place to suction.   Hospital course complicated by acute PE and LLE DVT for which she was started on heparin -after which she unfortunately developed profound acute blood loss anemia with subsequent pelvic hematoma near the small bowel anastomosis as well as notable hematuria.  Urology consulted, signed off after resolution of hematuria. Given high risk of bleed with notable PE/DVT patient was evaluated by IR who placed IVC filter on 10/05/2023.  Patient  required total of 4 unit of blood transfusion, hemoglobin now stabilizing -no indication for further imaging or intervention at this time.   Patient continues to work with PT OT, patient will likely require placement given her prolonged hospitalization, weakness, and diminished ambulatory status from baseline.    Assessment & Plan    Assessment and Plan:  Large Bowel Obstruction:  10/17 CT with proximal sigmoid colon thickening.  10/19 sigmoidoscopy with tight stricture - was found to have a mass biopsy negative for malignancy as suggesting inflammation.  10/21 s/p ex lap hartman's resection , small bowel resection and ostomy.  Gen surgery on board.  Repeat CT abd pelvis ordered.  Pain controlled. NPO except for ice chips and sips of coke.   Post  operative ileus vs partial SBO Exacerbated by intra abd bleeding.  CT abd and pelvis.    Acute blood loss anemia sec to pelvic/ peri anastomotic hematomas with Hematuria S/p 4 units of prbc transfusions.  S/p IVC filter,  Urology recommended no indication for cystoscopy or intervention.    Acute PE with right heart strain with acute left leg DVT Unable to tolerate anti coagulation , hence IVc filter placed on 10/25    Nodule in the  inf aspect of the isthmus Thyroid US - follow up outpatient.    PAF Rate controlled.    Hyponatremia  Resolved.    Hypokalemia:  Replaced.   Leukocytosis: Seems persistent.  Likely due to #1 -Continue monitoring   Goals of care: Changed to DNR/DNI 10/24. -Palliative medicine on board   Body mass index is 23.15 kg/m.      RN Pressure Injury Documentation: Pressure Injury 10/08/23 Buttocks Right;Mid Stage 2 -  Partial thickness loss of dermis presenting as a shallow open injury with a red, pink wound bed without slough. (Active)  10/08/23 0800  Location: Buttocks  Location Orientation: Right;Mid  Staging: Stage 2 -  Partial thickness loss of dermis presenting as a shallow open  injury with a red, pink wound bed without slough.  Wound Description (Comments):   Present on Admission:   Dressing Type None 10/10/23 0800     Estimated body mass index is 20.91 kg/m as calculated from the following:   Height as of this encounter: 5\' 5"  (1.651 m).   Weight as of this encounter: 57 kg.  Code Status: DNR limited.  DVT Prophylaxis:  Place and maintain sequential compression device Start: 10/06/23 1319   Level of Care: Level of care: Stepdown Family Communication: None at bedside.   Disposition Plan:     Remains inpatient appropriate:  NG tube in place.   Procedures:  CT abd and pelvis.   Consultants:   General surgery.   Antimicrobials:   Anti-infectives (From admission, onward)    Start     Dose/Rate Route Frequency Ordered Stop   10/05/23 1200  piperacillin-tazobactam (ZOSYN) IVPB 3.375 g        3.375 g 12.5 mL/hr over 240 Minutes Intravenous Every 8 hours 10/05/23 0721 10/09/23 1716   10/04/23 2000  piperacillin-tazobactam (ZOSYN) IVPB 3.375 g  Status:  Discontinued        3.375 g 12.5 mL/hr over 240 Minutes Intravenous Every 8 hours 10/04/23 1827 10/05/23 0721   09/27/23 2315  piperacillin-tazobactam (ZOSYN) IVPB 3.375 g  Status:  Discontinued        3.375 g 12.5 mL/hr over 240 Minutes Intravenous Every 8 hours 09/27/23 2305 10/02/23 1106        Medications  Scheduled Meds:  bisacodyl  10 mg Rectal Once   Chlorhexidine Gluconate Cloth  6 each Topical Daily   insulin aspart  0-9 Units Subcutaneous Q4H   Continuous Infusions: PRN Meds:.acetaminophen **OR** acetaminophen, HYDROmorphone (DILAUDID) injection, liver oil-zinc oxide, LORazepam, ondansetron **OR** ondansetron (ZOFRAN) IV, phenol, prochlorperazine    Subjective:   Virna Chaudhari was seen and examined today.  Wants her soda from downstairs.   Objective:   Vitals:   10/10/23 0900 10/10/23 1000 10/10/23 1100 10/10/23 1200  BP: 101/72 (!) 138/91 (!) 153/69   Pulse: 76 76 74    Resp: (!) 22 13 13    Temp:    98.2 F (36.8 C)  TempSrc:    Oral  SpO2: 95% 94% 96%   Weight:      Height:        Intake/Output Summary (Last 24 hours) at 10/10/2023 1335 Last data filed at 10/09/2023 2245 Gross per 24 hour  Intake --  Output 1000 ml  Net -1000 ml   Filed Weights   10/07/23 0800 10/09/23 0500 10/10/23 0500  Weight: 63.1 kg 59.2 kg 57 kg     Exam General: Alert and oriented x 3, NAD Cardiovascular: S1 S2 auscultated, no murmurs, RRR Respiratory: Clear to  auscultation bilaterally, no wheezing, rales or rhonchi Gastrointestinal: Soft, nontender, nondistended, + bowel sounds Ext: no pedal edema bilaterally Neuro: alert and oriented. Skin: No rashes Psych: Normal affect and demeanor, alert and oriented x3    Data Reviewed:  I have personally reviewed following labs and imaging studies   CBC Lab Results  Component Value Date   WBC 9.3 10/10/2023   RBC 3.99 10/10/2023   HGB 12.5 10/10/2023   HCT 39.1 10/10/2023   MCV 98.0 10/10/2023   MCH 31.3 10/10/2023   PLT 347 10/10/2023   MCHC 32.0 10/10/2023   RDW 15.8 (H) 10/10/2023   LYMPHSABS 0.7 10/07/2023   MONOABS 1.0 10/07/2023   EOSABS 0.2 10/07/2023   BASOSABS 0.0 10/07/2023     Last metabolic panel Lab Results  Component Value Date   NA 141 10/10/2023   K 3.1 (L) 10/10/2023   CL 100 10/10/2023   CO2 28 10/10/2023   BUN 27 (H) 10/10/2023   CREATININE 0.65 10/10/2023   GLUCOSE 108 (H) 10/10/2023   GFRNONAA >60 10/10/2023   GFRAA >60 04/10/2016   CALCIUM 8.6 (L) 10/10/2023   PHOS 3.0 10/07/2023   PROT 5.8 (L) 10/08/2023   ALBUMIN 2.4 (L) 10/08/2023   BILITOT 1.6 (H) 10/08/2023   ALKPHOS 107 10/08/2023   AST 23 10/08/2023   ALT 25 10/08/2023   ANIONGAP 13 10/10/2023    CBG (last 3)  Recent Labs    10/10/23 0440 10/10/23 0743 10/10/23 1204  GLUCAP 113* 142* 146*      Coagulation Profile: Recent Labs  Lab 10/05/23 0247  INR 1.1     Radiology Studies: No results  found.     Kathlen Mody M.D. [facility] 10/10/2023, 1:35 PM  Available via Epic secure chat 7am-7pm After 7 pm, please refer to night coverage provider listed on amion.

## 2023-10-11 ENCOUNTER — Other Ambulatory Visit: Payer: Self-pay

## 2023-10-11 ENCOUNTER — Inpatient Hospital Stay (HOSPITAL_COMMUNITY): Payer: PPO

## 2023-10-11 ENCOUNTER — Encounter (HOSPITAL_COMMUNITY): Payer: Self-pay | Admitting: Family Medicine

## 2023-10-11 DIAGNOSIS — I824Y2 Acute embolism and thrombosis of unspecified deep veins of left proximal lower extremity: Secondary | ICD-10-CM | POA: Diagnosis not present

## 2023-10-11 DIAGNOSIS — K56609 Unspecified intestinal obstruction, unspecified as to partial versus complete obstruction: Secondary | ICD-10-CM | POA: Diagnosis not present

## 2023-10-11 DIAGNOSIS — D62 Acute posthemorrhagic anemia: Secondary | ICD-10-CM | POA: Diagnosis not present

## 2023-10-11 LAB — CBC
HCT: 39.2 % (ref 36.0–46.0)
Hemoglobin: 12.7 g/dL (ref 12.0–15.0)
MCH: 31.1 pg (ref 26.0–34.0)
MCHC: 32.4 g/dL (ref 30.0–36.0)
MCV: 95.8 fL (ref 80.0–100.0)
Platelets: 355 10*3/uL (ref 150–400)
RBC: 4.09 MIL/uL (ref 3.87–5.11)
RDW: 15.2 % (ref 11.5–15.5)
WBC: 9.8 10*3/uL (ref 4.0–10.5)
nRBC: 0 % (ref 0.0–0.2)

## 2023-10-11 LAB — PROTIME-INR
INR: 1.1 (ref 0.8–1.2)
Prothrombin Time: 14.3 s (ref 11.4–15.2)

## 2023-10-11 LAB — BASIC METABOLIC PANEL
Anion gap: 11 (ref 5–15)
BUN: 18 mg/dL (ref 8–23)
CO2: 26 mmol/L (ref 22–32)
Calcium: 8.5 mg/dL — ABNORMAL LOW (ref 8.9–10.3)
Chloride: 99 mmol/L (ref 98–111)
Creatinine, Ser: 0.47 mg/dL (ref 0.44–1.00)
GFR, Estimated: >60 mL/min (ref >60)
Glucose, Bld: 122 mg/dL — ABNORMAL HIGH (ref 70–99)
Potassium: 3.2 mmol/L — ABNORMAL LOW (ref 3.5–5.1)
Sodium: 136 mmol/L (ref 135–145)

## 2023-10-11 LAB — GLUCOSE, CAPILLARY
Glucose-Capillary: 109 mg/dL — ABNORMAL HIGH (ref 70–99)
Glucose-Capillary: 124 mg/dL — ABNORMAL HIGH (ref 70–99)
Glucose-Capillary: 147 mg/dL — ABNORMAL HIGH (ref 70–99)
Glucose-Capillary: 120 mg/dL — ABNORMAL HIGH (ref 70–99)
Glucose-Capillary: 110 mg/dL — ABNORMAL HIGH (ref 70–99)
Glucose-Capillary: 103 mg/dL — ABNORMAL HIGH (ref 70–99)

## 2023-10-11 MED ORDER — IOHEXOL 9 MG/ML PO SOLN
500.0000 mL | ORAL | Status: AC
  Administered 2023-10-11: 500 mL via ORAL

## 2023-10-11 MED ORDER — IOHEXOL 300 MG/ML  SOLN
100.0000 mL | Freq: Once | INTRAMUSCULAR | Status: AC | PRN
  Administered 2023-10-11: 100 mL via INTRAVENOUS

## 2023-10-11 MED ORDER — IOHEXOL 9 MG/ML PO SOLN
ORAL | Status: AC
  Filled 2023-10-11: qty 500

## 2023-10-11 MED ORDER — BISACODYL 10 MG RE SUPP
10.0000 mg | Freq: Every day | RECTAL | Status: DC
  Administered 2023-10-11 – 2023-10-24 (×13): 10 mg via RECTAL
  Filled 2023-10-11 (×15): qty 1

## 2023-10-11 MED ORDER — POLYVINYL ALCOHOL 1.4 % OP SOLN
1.0000 [drp] | OPHTHALMIC | Status: DC | PRN
  Administered 2023-10-11 – 2023-11-08 (×10): 1 [drp] via OPHTHALMIC
  Filled 2023-10-11: qty 15

## 2023-10-11 MED ORDER — POTASSIUM CHLORIDE 10 MEQ/100ML IV SOLN
10.0000 meq | INTRAVENOUS | Status: AC
  Administered 2023-10-11 (×2): 10 meq via INTRAVENOUS
  Filled 2023-10-11 (×2): qty 100

## 2023-10-11 NOTE — Progress Notes (Signed)
PICC order noted for TPN to start 11/1.  Morrie Sheldon aware that PICC will be placed 11/1, states patient has adequate IV access for now.

## 2023-10-11 NOTE — TOC Progression Note (Addendum)
Transition of Care [facility]) - Progression Note    Patient Details  Name: ELLYN SHUMAKER MRN: [id number] Date of Birth: April 03, 1941  Transition of Care [facility] CM/SW Contact  Darleene Cleaver, [place] Phone Number: 10/11/2023, 2:00 PM  Clinical Narrative:     CSW spoke to patient to see if she was interested in SNF placement now.  Patient did not want to talk about it right now.  Patient states she is frustrated that she is not improving as well as she would like to be.  Per patient, she stated she is frustrated because she can't drink anything.  PICC line for TPN will be started today.  TOC to follow up at a later time regarding SNF placement.  Expected Discharge Plan: Home w Home Health Services Barriers to Discharge: Continued Medical Work up  Expected Discharge Plan and Services In-house Referral: Clinical Social Work   Post Acute Care Choice: Home Health Living arrangements for the past 2 months: Independent Living Facility, Apartment                 DME Arranged: N/A DME Agency: NA       HH Arranged: PT, RN HH Agency: [facility] Date HH Agency Contacted: 10/02/23 Time HH Agency Contacted: 1550 Representative spoke with at [facility] Agency: Morrie Sheldon   Social Determinants of Health (SDOH) Interventions SDOH Screenings   Food Insecurity: No Food Insecurity (09/28/2023)  Housing: Low Risk  (09/28/2023)  Transportation Needs: No Transportation Needs (09/28/2023)  Utilities: Not At Risk (09/28/2023)  Tobacco Use: Low Risk  (10/04/2023)    Readmission Risk Interventions     No data to display

## 2023-10-11 NOTE — Progress Notes (Signed)
PT Cancellation Note  Patient Details Name: Elizabeth Odom MRN: [id number] DOB: [birth year]   Cancelled Treatment:    Reason Eval/Treat Not Completed: Medical issues which prohibited therapy, per RN. Will check back another time.  Blanchard Kelch PT Acute Rehabilitation Services Office [phone] Weekend pager-[phone]    Rada Hay 10/11/2023, 12:55 PM

## 2023-10-11 NOTE — Progress Notes (Signed)
Progress Note  10 Days Post-Op  Subjective: More gas from stoma. CT done early this AM. RN reported that she actually was clamped most of yesterday with contrast given and plans for CT. Patient still anxious about NGT removal.   Objective: Vital signs in last 24 hours: Temp:  [97.8 F (36.6 C)-99.1 F (37.3 C)] 98.1 F (36.7 C) (10/31 0723) Pulse Rate:  [37-130] 71 (10/31 0800) Resp:  [10-28] 18 (10/31 0800) BP: (101-168)/(56-91) 136/72 (10/31 0800) SpO2:  [91 %-100 %] 92 % (10/31 0800) Weight:  [64.4 kg] 64.4 kg (10/31 0500) Last BM Date : 10/10/23  Intake/Output from previous day: 10/30 0701 - 10/31 0700 In: 10 [P.O.:10] Out: 850 [Urine:450; Emesis/NG output:400] Intake/Output this shift: No intake/output data recorded.  PE: General: pleasant, WD, thin female who is laying in bed in NAD Lungs: Respiratory effort nonlabored Abd: soft, appropriately ttp, midline wound clean, stoma viable with air in ostomy bag, able to get my index finger through stoma at fascial level  Psych: A&Ox3 with an appropriate affect.    Lab Results:  Recent Labs    10/10/23 0312  WBC 9.3  HGB 12.5  HCT 39.1  PLT 347   BMET Recent Labs    10/10/23 0312  NA 141  K 3.1*  CL 100  CO2 28  GLUCOSE 108*  BUN 27*  CREATININE 0.65  CALCIUM 8.6*   PT/INR No results for input(s): "LABPROT", "INR" in the last 72 hours. CMP     Component Value Date/Time   NA 141 10/10/2023 0312   K 3.1 (L) 10/10/2023 0312   CL 100 10/10/2023 0312   CO2 28 10/10/2023 0312   GLUCOSE 108 (H) 10/10/2023 0312   BUN 27 (H) 10/10/2023 0312   CREATININE 0.65 10/10/2023 0312   CALCIUM 8.6 (L) 10/10/2023 0312   PROT 5.8 (L) 10/08/2023 0318   ALBUMIN 2.4 (L) 10/08/2023 0318   AST 23 10/08/2023 0318   ALT 25 10/08/2023 0318   ALKPHOS 107 10/08/2023 0318   BILITOT 1.6 (H) 10/08/2023 0318   GFRNONAA >60 10/10/2023 0312   GFRAA >60 04/10/2016 1352   Lipase     Component Value Date/Time   LIPASE 41  09/27/2023 2007       Studies/Results: No results found.  Anti-infectives: Anti-infectives (From admission, onward)    Start     Dose/Rate Route Frequency Ordered Stop   10/05/23 1200  piperacillin-tazobactam (ZOSYN) IVPB 3.375 g        3.375 g 12.5 mL/hr over 240 Minutes Intravenous Every 8 hours 10/05/23 0721 10/09/23 1716   10/04/23 2000  piperacillin-tazobactam (ZOSYN) IVPB 3.375 g  Status:  Discontinued        3.375 g 12.5 mL/hr over 240 Minutes Intravenous Every 8 hours 10/04/23 1827 10/05/23 0721   09/27/23 2315  piperacillin-tazobactam (ZOSYN) IVPB 3.375 g  Status:  Discontinued        3.375 g 12.5 mL/hr over 240 Minutes Intravenous Every 8 hours 09/27/23 2305 10/02/23 1106        Assessment/Plan  POD10 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 [name] - CT 10/24 with hematoma in the pelvis adjacent to small bowel anastomosis and ileus vs obstruction  - CT this AM read pending but appears to still have pelvic hematoma and contrast throughout colon - stomal suppositories today - Clamp NGT and allow CLD and possibly remove NGT later today if tolerating  - midline wound clean  - no leukocytosis, completed 5  d zosyn  FEN: CLD, NGT clamped VTE: IVC Filter  ID: no current abx  - per TRH -  ABL anemia secondary to post-op hematomas and hematuria  Acute PE and LLE DVT Pancreatic tail cyst Thyroid nodule  Chronic HFpEF Paroxysmal A. Fib Pre-diabetes RLS  LOS: 14 days    Juliet Rude, [facility] 10/11/2023, 8:46 AM Please see Amion for pager number during day hours 7:00am-4:30pm

## 2023-10-11 NOTE — Consult Note (Signed)
Chief Complaint: Patient was seen in consultation today for  Chief Complaint  Patient presents with   Abdominal Pain   Referring Physician(s): Carl Best, PA-C  Supervising Physician: Simonne Come  Patient Status: [facility] - In-pt  History of Present Illness: Elizabeth Odom is an 82 y.o. female with a medical history significant for atrial fibrillation, HTN and cervical cancer. She was seen in the ED 10/10 for abdominal pain, decreased appetite and fever. She was diagnosed with possible colitis and discharged from the ED on oral antibiotics.    She presented to the [facility] ED 09/27/23 with complaints of abdominal pain, nausea, vomiting, diarrhea and poor oral intake. Imaging showed a bowel obstruction and she underwent small bowel resection with colostomy creation. Imaging also showed a cystic lesion in the tail of the pancreas and an L1 lesion.   Her hospital stay has been complicated by acute PE and LLE DVT for which she was started on IV heparin. She developed profound acute blood loss anemia with subsequent pelvic hematoma near the small bowel anastomosis as well as hematuria. She required multiple units of PRBCs. She was taken off anticoagulation and she was seen in IR 10/05/23 for IVC filter placement.   Repeat abdominal imaging yesterday shows a persistent leak at the anastomotic site.   CT Abdomen/Pelvis 10/10/23 IMPRESSION: Extensive postsurgical changes are noted, with colostomy seen in left lower quadrant as well as Hartmann's pouch. Status post partial left colectomy. Small bowel anastomotic site is noted in the pelvis, and there does appear to be extravasation of contrast in this area suggesting perforation or leakage. Contrast is seen extending into 7 x 6 cm complex fluid collection posteriorly in the pelvis which appears to contain some degree of clot as well. These results will be called to the ordering clinician or representative by the Radiologist Assistant, and  communication documented in the PACS or zVision Dashboard.  Interventional Radiology was consulted by the Surgical Team for pelvic fluid aspiration with drain placement. Dr. Grace Isaac approved patient for a right transgluteal drain placement.   Past Medical History:  Diagnosis Date   A-fib (HCC) 04/09/2016   Acute head injury 04/09/2016   Cervical cancer (HCC)    Cervical spine fracture, initial encounter 04/09/2016   Essential hypertension    History of anemia    Multiple fractures of cervical spine (HCC) 04/09/2016   PAC (premature atrial contraction)    Restless leg    Syncope and collapse 04/09/2016    Past Surgical History:  Procedure Laterality Date   APPENDECTOMY     BIOPSY  09/29/2023   Procedure: BIOPSY;  Surgeon: Kerin Salen, MD;  Location: [facility] ENDOSCOPY;  Service: Gastroenterology;;   breast lump removal Left    CERVICAL LAMINECTOMY     COLECTOMY WITH COLOSTOMY CREATION/HARTMANN PROCEDURE N/A 10/01/2023   Procedure: COLECTOMY WITH COLOSTOMY CREATION/HARTMANN PROCEDURE;  Surgeon: Axel Filler, MD;  Location: [facility] ORS;  Service: General;  Laterality: N/A;   complete hysterectomy     EP IMPLANTABLE DEVICE N/A 04/10/2016   Procedure: Loop Recorder Insertion;  Surgeon: Marinus Maw, MD;  Location: [facility] INVASIVE CV LAB;  Service: Cardiovascular;  Laterality: N/A;   FLEXIBLE SIGMOIDOSCOPY N/A 09/29/2023   Procedure: FLEXIBLE SIGMOIDOSCOPY;  Surgeon: Kerin Salen, MD;  Location: [facility] ENDOSCOPY;  Service: Gastroenterology;  Laterality: N/A;   HARVEST BONE GRAFT     IR IVC FILTER PLMT / S&I /IMG GUID/MOD SED  10/05/2023   LOOP RECORDER REMOVAL N/A 07/08/2018   Procedure: LOOP RECORDER REMOVAL;  Surgeon: Marinus Maw, MD;  Location: [facility] INVASIVE CV LAB;  Service: Cardiovascular;  Laterality: N/A;    Allergies: Elemental sulfur  Medications: Prior to Admission medications   Medication Sig Start Date End Date Taking? Authorizing Provider  amoxicillin-clavulanate (AUGMENTIN) 875-125 MG  tablet Take 1 tablet by mouth every 12 (twelve) hours. 09/21/23  Yes Horton, Kristie M, DO  ondansetron (ZOFRAN-ODT) 4 MG disintegrating tablet Take 4 mg by mouth every 8 (eight) hours as needed for vomiting or nausea. 09/16/23  Yes [provider]  meclizine (ANTIVERT) 25 MG tablet Take 25 mg by mouth 3 (three) times daily. Patient not taking: Reported on 11/23/2020    [provider]  Omega-3 Fatty Acids (FISH OIL) 500 MG CAPS Take 1 capsule by mouth daily. Patient not taking: Reported on 09/27/2023    [provider]     Family History  Problem Relation Age of Onset   Other Mother        thrombosis   Cancer Sister    Diabetes Sister    Stroke Daughter    Thyroid disease Daughter     Social History   Socioeconomic History   Marital status: Widowed    Spouse name: Not on file   Number of children: 3   Years of education: schooling in [place]   Highest education level: Not on file  Occupational History   Not on file  Tobacco Use   Smoking status: Never   Smokeless tobacco: Never  Substance and Sexual Activity   Alcohol use: Never    Alcohol/week: 0.0 standard drinks of alcohol   Drug use: Never   Sexual activity: Not on file  Other Topics Concern   Not on file  Social History Narrative   Patient is originally from [place]. 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 Her emergency contact/medical decision maker is her granddaughter:  [name] [phone]).   Social Determinants of Health   Financial Resource Strain: Not on file  Food Insecurity: No Food Insecurity (09/28/2023)   Hunger Vital Sign    Worried About Running Out of Food in the Last Year: Never true    Ran Out of Food in the Last Year: Never true  Transportation Needs: No Transportation Needs (09/28/2023)   PRAPARE - Administrator, Civil Service (Medical): No    Lack of Transportation  (Non-Medical): No  Physical Activity: Not on file  Stress: Not on file  Social Connections: Not on file    Review of Systems: A 12 point ROS discussed and pertinent positives are indicated in the HPI above.  All other systems are negative.  Review of Systems  Constitutional:  Positive for appetite change and fatigue.  Respiratory:  Negative for cough and shortness of breath.   Cardiovascular:  Negative for chest pain and leg swelling.  Gastrointestinal:  Positive for abdominal pain.  Musculoskeletal:  Positive for arthralgias, back pain and myalgias.    Vital Signs: BP 136/72 (BP Location: Right Arm)   Pulse 71   Temp 98.7 F (37.1 C) (Oral)   Resp 18   Ht 5\' 5"  (1.651 m)   Wt 141 lb 15.6 oz (64.4 kg)   SpO2 92%   BMI 23.63 kg/m   Physical Exam Constitutional:      General: She is not in acute distress.    Appearance: She is ill-appearing.  HENT:     Nose:  Comments: NG tube to suction    Mouth/Throat:     Mouth: Mucous membranes are dry.  Cardiovascular:     Rate and Rhythm: Normal rate.  Pulmonary:     Effort: Pulmonary effort is normal.  Abdominal:     Tenderness: There is abdominal tenderness.     Comments: colostomy  Skin:    General: Skin is warm and dry.  Neurological:     Mental Status: She is alert and oriented to person, place, and time.  Psychiatric:        Mood and Affect: Mood normal.        Behavior: Behavior normal.        Thought Content: Thought content normal.        Judgment: Judgment normal.     Imaging: CT ABDOMEN PELVIS W CONTRAST  Result Date: 10/11/2023 CLINICAL DATA:  Postoperative abdominal pain. EXAM: CT ABDOMEN AND PELVIS WITH CONTRAST TECHNIQUE: Multidetector CT imaging of the abdomen and pelvis was performed using the standard protocol following bolus administration of intravenous contrast. RADIATION DOSE REDUCTION: This exam was performed according to the departmental dose-optimization program which includes automated  exposure control, adjustment of the mA and/or kV according to patient size and/or use of iterative reconstruction technique. CONTRAST:  OMNIPAQUE IOHEXOL 300 MG/ML  SOLN COMPARISON:  October 04, 2023. FINDINGS: Lower chest: Mild bilateral posterior basilar subsegmental atelectasis is noted. Hepatobiliary: No focal liver abnormality is seen. No gallstones, gallbladder wall thickening, or biliary dilatation. Pancreas: Unremarkable. No pancreatic ductal dilatation or surrounding inflammatory changes. Spleen: Normal in size without focal abnormality. Adrenals/Urinary Tract: Adrenal glands appear normal. Stable right renal cyst for which no further follow-up is required. Small left renal calculus. No hydronephrosis or renal obstruction is noted. Urinary bladder is unremarkable. Stomach/Bowel: Nasogastric tube tip is seen in stomach. Colostomy is noted in left lower quadrant with Hartmann's pouch. Status post partial small bowel obstruction and partial left colectomy. No significant bowel dilatation is noted. Small bowel surgical anastomosis is noted in the pelvis. There appears to be extravasation of contrast in this area suggesting possible bowel perforation. This is best seen on image number 60 of series 7. Contrast appears to be extending into posterior pelvic complex fluid collection which measures 7 x 6 cm. There is high density material within this suggesting clot. Vascular/Lymphatic: Aortic atherosclerosis. No enlarged abdominal or pelvic lymph nodes. IVC filter is noted in infrarenal position. Reproductive: Status post hysterectomy. No adnexal masses. Other: Midline surgical incision is noted anteriorly in the pelvis. No definite hernia is noted. Musculoskeletal: No acute or significant osseous findings. IMPRESSION: Extensive postsurgical changes are noted, with colostomy seen in left lower quadrant as well as Hartmann's pouch. Status post partial left colectomy. Small bowel anastomotic site is noted in the  pelvis, and there does appear to be extravasation of contrast in this area suggesting perforation or leakage. Contrast is seen extending into 7 x 6 cm complex fluid collection posteriorly in the pelvis which appears to contain some degree of clot as well. These results will be called to the ordering clinician or representative by the Radiologist Assistant, and communication documented in the PACS or zVision Dashboard. Aortic Atherosclerosis (ICD10-I70.0). Electronically Signed   By: Lupita Raider M.D.   On: 10/11/2023 11:44   DG Abd 1 View  Result Date: 10/08/2023 CLINICAL DATA:  Nasogastric tube placement. EXAM: ABDOMEN - 1 VIEW COMPARISON:  Radiograph 10/06/2023 FINDINGS: Tip and side port of the enteric tube below the diaphragm in the  stomach. Few prominent air-filled loops of small bowel in the pelvis. IVC filter in place. IMPRESSION: Tip and side port of the enteric tube below the diaphragm in the stomach. Electronically Signed   By: Narda Rutherford M.D.   On: 10/08/2023 09:59   IR IVC FILTER PLMT / S&I Lenise Arena GUID/MOD SED  Result Date: 10/05/2023 CLINICAL DATA:  Lower extremity DVT, pulmonary emboli. Postoperative hemorrhage, a relative contraindication to anticoagulation. Caval filtration requested. EXAM: INFERIOR VENACAVOGRAM IVC FILTER PLACEMENT UNDER FLUOROSCOPY FLUOROSCOPY: Radiation Exposure Index (as provided by the fluoroscopic device): 25 MGy air Kerma TECHNIQUE: Patency of the right IJ vein was confirmed with ultrasound with image documentation. An appropriate skin site was determined. Skin site was marked, prepped with chlorhexidine, and draped using maximum barrier technique. The region was infiltrated locally with 1% lidocaine. Intravenous Fentanyl and Versed 1mg  were administered as conscious sedation during continuous monitoring of the patient's level of consciousness and physiological / cardiorespiratory status by the radiology RN, with a total moderate sedation time of 14  minutes. Under real-time ultrasound guidance, the right IJ vein was accessed with a 21 gauge micropuncture needle; the needle tip within the vein was confirmed with ultrasound image documentation. The needle was exchanged over a 018 guidewire for a transitional dilator, which allow advancement of the [facility] wire into the IVC. A long 6 French vascular sheath was placed for inferior venacavography. This demonstrated no caval thrombus. Renal vein inflows were evident. The retrievable Denali IVC filter was advanced through the sheath and successfully deployed under fluoroscopy at the L3 level. Followup cavagram demonstrates stable filter position and no evident complication. The sheath was removed and hemostasis achieved at the site. No immediate complication. IMPRESSION: 1. Normal IVC. No thrombus or significant anatomic variation. 2. Technically successful infrarenal IVC filter placement. This is a retrievable model. PLAN: This IVC filter is potentially retrievable. The patient will be assessed for filter retrieval by Interventional Radiology in approximately 8-12 weeks. Further recommendations regarding filter retrieval, continued surveillance or declaration of device permanence, will be made at that time. Electronically Signed   By: Corlis Leak M.D.   On: 10/05/2023 13:43   DG Abd 1 View  Result Date: 10/05/2023 CLINICAL DATA:  [id number] Encounter for nasogastric tube placement [id number]. EXAM: ABDOMEN - 1 VIEW COMPARISON:  Abdominal radiographs 10/04/2023. FINDINGS: Enteric tube tip and side port project over the stomach. IMPRESSION: Enteric tube tip and side port project over the stomach. Electronically Signed   By: Orvan Falconer M.D.   On: 10/05/2023 13:43   CT HEMATURIA WORKUP  Result Date: 10/04/2023 CLINICAL DATA:  Hematuria. EXAM: CT ABDOMEN AND PELVIS WITHOUT AND WITH CONTRAST TECHNIQUE: Multidetector CT imaging of the abdomen and pelvis was performed following the standard protocol before and following  the bolus administration of intravenous contrast. RADIATION DOSE REDUCTION: This exam was performed according to the departmental dose-optimization program which includes automated exposure control, adjustment of the mA and/or kV according to patient size and/or use of iterative reconstruction technique. CONTRAST:  OMNIPAQUE IOHEXOL 300 MG/ML  SOLN COMPARISON:  Preop CT scan 09/27/2023 and older FINDINGS: Lower chest: Breathing motion at the bases. Tiny right effusion and trace left. Mild basilar atelectasis. Known pulmonary emboli again identified. Hepatobiliary: No focal liver abnormality is seen. No gallstones, gallbladder wall thickening, or biliary dilatation. Patent portal vein. Pancreas: Unremarkable. No pancreatic ductal dilatation or surrounding inflammatory changes. Spleen: Normal in size without focal abnormality. Adrenals/Urinary Tract: Adrenal glands are preserved. There is a exophytic cyst  from the lower pole of the right kidney with a thin septation with calcification measuring 6.7 x 6.5 cm in the axial plane. Hounsfield units of 7. No aggressive features. Bosniak 2 lesion. No specific imaging follow-up. No enhancing renal mass. There is a punctate nonobstructing stone along the lower pole of the left kidney. Small punctate hyperdense focus measuring 3 mm along the upper pole left kidney on series 5, image 59 of the precontrast coronal. Slightly brush like appearance to the calices consistent with benign tubular ectasia. The collecting systems are nondilated. The ureters have normal course and caliber down to the bladder. No urothelial thickening, filling defect or dilatation. Grossly preserved contours of the urinary bladder. There is a small bubble of air in the lumen of the bladder. Please correlate for etiology including any recent instrumentation. Slight areas of wall thickening of the bladder but nonspecific with the adjacent process. Stomach/Bowel: Stomach is fluid-filled. The duodenal is  dilated with air-fluid levels measuring up to 3.8 cm. There is also a fluid-filled dilated jejunum in the left midabdomen and pelvis. These extend down to a small bowel suture line in the central pelvis above the bladder as seen on series 7, image 65. No oral contrast. In this location is some air with mixed intermediate density material along the bowel. This could be luminal debris although based on appearance this could be also hematoma. This loop is dilated up to 7.1 cm focally in the axial plane. Also the suture line is somewhat discontiguous in some locations as seen on coronal series 9, image 31. There is also a significant complex fluid collection posterior to this area in the pelvis anterior to the rectum above the bladder on series 7, image 72 measuring 8.9 cm. Again this could be hematoma. 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 Small-bowel resection and anastomosis. The distal ileum is more normal caliber, relatively decompressed The large bowel demonstrates some scattered stool with diverticula. Once again there has been distal colonic resection with a left lower quadrant colostomy. The loop extending to the colostomy is slightly with wall thickening. There is rectosigmoid stump with some luminal debris. Vascular/Lymphatic: Aortic atherosclerosis. No enlarged abdominal or pelvic lymph nodes. Reproductive: Status post hysterectomy. No adnexal masses. Other: There is open anterior abdominal wall pelvic wound in the midline. Anasarca. Mesenteric stranding. Smaller areas of trace fluid throughout the mesentery. Presacral and perirectal fat edema. Few bubbles of free air which could be postoperative. Musculoskeletal: Degenerative changes along the spine. IMPRESSION: Surgical changes identified since the prior examination consistent with the provided history of partial left colectomy, colostomy with a Hartmann's pouch procedure and associated small bowel  resection with anastomosis. Few scattered areas of free air are as per surgery. The suture line of the small bowel in the pelvis is a dilated loop of bowel with mixed density including some intermediate areas in the lumen. This bowel loop is dilated to 7.1 cm in the proximal jejunum and duodenal is dilated with fluid. This could be a focal developing obstruction. The more distal small bowel is nondilated. In addition there is significant adjacent presumed hematoma in the pelvis posterior to the suture line and on the coronal images the suture line is somewhat indistinct along its course. With level of dilatation, the surrounding fluid, gas and hematoma, please correlate for any clinical evidence of a leak or dehiscence versus expected postop change. Slight wall thickening along the large bowel loop extending out to the colostomy along the  left side. Diffuse colonic diverticula. Tiny pleural effusions, right-greater-than-left. Punctate nonobstructing lower pole left-sided renal stone. Bosniak 2 right-sided renal cystic lesion. No enhancing renal mass or separate collecting system dilatation. Slight wall thickening of the urinary bladder but this could relate to the adjacent process. Small bubble of air in the bladder lumen could be related to previous instrumentation but please correlate with history. Known pulmonary emboli. These are somewhat poorly seen on this abdomen and pelvis hematuria workup exam Critical Value/emergent results were called by telephone at the time of interpretation on 10/04/2023 at 3:04 pm to provider [facility] , who verbally acknowledged these results and will follow up with the surgery team. Electronically Signed   By: Karen Kays M.D.   On: 10/04/2023 18:09   VAS [place] LOWER EXTREMITY VENOUS (DVT)  Result Date: 10/04/2023  Lower Venous DVT Study Patient Name:  MYRTH SHANN  Date of Exam:   10/04/2023 Medical Rec #: [id number]       Accession #:    [id number] Date of Birth: Apr 15, 1941       Patient Gender: F Patient Age:   67 years Exam Location:  [facility] Procedure:      VAS [place] LOWER EXTREMITY VENOUS (DVT) Referring Phys: RAMESH KC --------------------------------------------------------------------------------  Indications: Pulmonary embolism.  Risk Factors: Confirmed PE. Anticoagulation: Heparin. Comparison Study: No prior studies. Performing Technologist: Chanda Busing RVT  Examination Guidelines: A complete evaluation includes B-mode imaging, spectral Doppler, color Doppler, and power Doppler as needed of all accessible portions of each vessel. Bilateral testing is considered an integral part of a complete examination. Limited examinations for reoccurring indications may be performed as noted. The reflux portion of the exam is performed with the patient in reverse Trendelenburg.  +---------+---------------+---------+-----------+----------+--------------+ RIGHT    CompressibilityPhasicitySpontaneityPropertiesThrombus Aging +---------+---------------+---------+-----------+----------+--------------+ CFV      Full           Yes      Yes                                 +---------+---------------+---------+-----------+----------+--------------+ SFJ      Full                                                        +---------+---------------+---------+-----------+----------+--------------+ FV Prox  Full                                                        +---------+---------------+---------+-----------+----------+--------------+ FV Mid   Full                                                        +---------+---------------+---------+-----------+----------+--------------+ FV DistalFull                                                        +---------+---------------+---------+-----------+----------+--------------+  PFV      Full                                                         +---------+---------------+---------+-----------+----------+--------------+ POP      Full           Yes      Yes                                 +---------+---------------+---------+-----------+----------+--------------+ PTV      Full                                                        +---------+---------------+---------+-----------+----------+--------------+ PERO     Full                                                        +---------+---------------+---------+-----------+----------+--------------+   +---------+---------------+---------+-----------+----------+--------------+ LEFT     CompressibilityPhasicitySpontaneityPropertiesThrombus Aging +---------+---------------+---------+-----------+----------+--------------+ CFV      Full           Yes      Yes                                 +---------+---------------+---------+-----------+----------+--------------+ SFJ      Full                                                        +---------+---------------+---------+-----------+----------+--------------+ FV Prox  Full                                                        +---------+---------------+---------+-----------+----------+--------------+ FV Mid   Full                                                        +---------+---------------+---------+-----------+----------+--------------+ FV DistalFull                                                        +---------+---------------+---------+-----------+----------+--------------+ PFV      Full                                                        +---------+---------------+---------+-----------+----------+--------------+  POP      Partial        Yes      Yes                  Acute          +---------+---------------+---------+-----------+----------+--------------+ PTV      Partial                                      Acute           +---------+---------------+---------+-----------+----------+--------------+ PERO     Partial                                      Acute          +---------+---------------+---------+-----------+----------+--------------+ Gastroc  None                                         Acute          +---------+---------------+---------+-----------+----------+--------------+     Summary: RIGHT: - There is no evidence of deep vein thrombosis in the lower extremity.  - No cystic structure found in the popliteal fossa.  LEFT: - Findings consistent with acute deep vein thrombosis involving the left popliteal vein, left posterior tibial veins, and left peroneal veins. Findings consistent with acute intramuscular thrombosis involving the left gastrocnemius veins. - No cystic structure found in the popliteal fossa.  *See table(s) above for measurements and observations. Electronically signed by Heath Lark on 10/04/2023 at 5:38:11 PM.    Final    DG Abd Portable 1V  Result Date: 10/04/2023 CLINICAL DATA:  Nausea and vomiting. EXAM: PORTABLE ABDOMEN - 1 VIEW COMPARISON:  09/27/2019 for FINDINGS: The bowel gas pattern has improved from the previous exam. There is been interval decrease in previously noted colonic distension. Within the left lower quadrant of the abdomen there are several dilated loops of small bowel which measure up to 3 mm, similar to the previous CT. IMPRESSION: 1. Interval improvement in diffuse colonic distension. 2. Persistent mild dilatation of small bowel loops in the left lower quadrant of the abdomen. Electronically Signed   By: Signa Kell M.D.   On: 10/04/2023 11:00   ECHOCARDIOGRAM COMPLETE  Result Date: 10/01/2023    ECHOCARDIOGRAM REPORT   Patient Name:   TYRAN NIX Date of Exam: 10/01/2023 Medical Rec #:  [id number]      Height:       66.0 in Accession #:    [id number]     Weight:       124.0 lb Date of Birth:  May 01, 1941     BSA:          1.632 m Patient Age:    81 years        BP:           120/54 mmHg Patient Gender: F              HR:           86 bpm. Exam Location:  Inpatient Procedure: 2D Echo, Cardiac Doppler and Color Doppler Indications:    Chest Pain R07.9  History:        Patient has prior history of Echocardiogram examinations, most  recent 04/10/2016. Arrythmias:Atrial Fibrillation; Risk                 Factors:Non-Smoker.  Sonographer:    Dondra Prader RVT RCS Referring Phys: [id number] [facility]  Sonographer Comments: Patient refused to reposition; exam done with patient supine. IMPRESSIONS  1. Left ventricular ejection fraction, by estimation, is 70 to 75%. The left ventricle has hyperdynamic function. The left ventricle has no regional wall motion abnormalities. There is mild concentric left ventricular hypertrophy. Left ventricular diastolic parameters are consistent with Grade I diastolic dysfunction (impaired relaxation).  2. Right ventricular systolic function is normal. The right ventricular size is normal.  3. The mitral valve is normal in structure. Trivial mitral valve regurgitation. No evidence of mitral stenosis.  4. The aortic valve is tricuspid. There is mild calcification of the aortic valve. Aortic valve regurgitation is not visualized. Aortic valve sclerosis/calcification is present, without any evidence of aortic stenosis.  5. The inferior vena cava is normal in size with greater than 50% respiratory variability, suggesting right atrial pressure of 3 mmHg. FINDINGS  Left Ventricle: Left ventricular ejection fraction, by estimation, is 70 to 75%. The left ventricle has hyperdynamic function. The left ventricle has no regional wall motion abnormalities. The left ventricular internal cavity size was normal in size. There is mild concentric left ventricular hypertrophy. Left ventricular diastolic parameters are consistent with Grade I diastolic dysfunction (impaired relaxation). Right Ventricle: The right ventricular size is normal. No increase in  right ventricular wall thickness. Right ventricular systolic function is normal. Left Atrium: Left atrial size was normal in size. Right Atrium: Right atrial size was normal in size. Pericardium: There is no evidence of pericardial effusion. Mitral Valve: The mitral valve is normal in structure. Trivial mitral valve regurgitation. No evidence of mitral valve stenosis. Tricuspid Valve: The tricuspid valve is normal in structure. Tricuspid valve regurgitation is trivial. No evidence of tricuspid stenosis. Aortic Valve: The aortic valve is tricuspid. There is mild calcification of the aortic valve. Aortic valve regurgitation is not visualized. Aortic valve sclerosis/calcification is present, without any evidence of aortic stenosis. Aortic valve mean gradient measures 3.0 mmHg. Aortic valve peak gradient measures 5.6 mmHg. Aortic valve area, by VTI measures 2.71 cm. Pulmonic Valve: The pulmonic valve was normal in structure. Pulmonic valve regurgitation is not visualized. No evidence of pulmonic stenosis. Aorta: The aortic root is normal in size and structure. Venous: The inferior vena cava is normal in size with greater than 50% respiratory variability, suggesting right atrial pressure of 3 mmHg. IAS/Shunts: No atrial level shunt detected by color flow Doppler.  LEFT VENTRICLE PLAX 2D LVIDd:         3.70 cm   Diastology LVIDs:         2.10 cm   LV e' medial:    4.13 cm/s LV PW:         0.90 cm   LV E/e' medial:  16.0 LV IVS:        1.00 cm   LV e' lateral:   11.00 cm/s LVOT diam:     2.10 cm   LV E/e' lateral: 6.0 LV SV:         54 LV SV Index:   33 LVOT Area:     3.46 cm  RIGHT VENTRICLE RV S prime:     18.30 cm/s LEFT ATRIUM         Index LA diam:    2.70 cm 1.65 cm/m  AORTIC VALVE  PULMONIC VALVE AV Area (Vmax):    2.68 cm     PV Vmax:       1.17 m/s AV Area (Vmean):   2.49 cm     PV Peak grad:  5.5 mmHg AV Area (VTI):     2.71 cm AV Vmax:           118.00 cm/s AV Vmean:          79.900 cm/s  AV VTI:            0.198 m AV Peak Grad:      5.6 mmHg AV Mean Grad:      3.0 mmHg LVOT Vmax:         91.20 cm/s LVOT Vmean:        57.500 cm/s LVOT VTI:          0.155 m LVOT/AV VTI ratio: 0.78  AORTA Ao Root diam: 3.20 cm Ao Asc diam:  2.90 cm MITRAL VALVE MV Area (PHT): 3.72 cm    SHUNTS MV Decel Time: 204 msec    Systemic VTI:  0.16 m MV E velocity: 66.00 cm/s  Systemic Diam: 2.10 cm MV A velocity: 90.80 cm/s MV E/A ratio:  0.73 Arvilla Meres MD Electronically signed by Arvilla Meres MD Signature Date/Time: 10/01/2023/3:06:21 PM    Final    CT CHEST W CONTRAST  Addendum Date: 10/01/2023   ADDENDUM REPORT: 10/01/2023 13:00 ADDENDUM: Upon re-review of CT scan chest with contrast performed yesterday (09/30/2023), the following findings are noted: There are several areas of nonocclusive filling defects in the segmental pulmonary artery branches to right upper lobe, right lower lobe and left upper lobe (marked with electronic arrow sign on series 2), compatible with acute small-to-moderate volume pulmonary embolism. No evidence of right heart strain or lung infarction. " Critical Value/emergent results were called by telephone at the time of interpretation on 10/01/2023 at 12:58 pm to provider Dr. Jonathon Bellows, Dayna Barker , who verbally acknowledged these results. Electronically Signed   By: Jules Schick M.D.   On: 10/01/2023 13:00   Result Date: 10/01/2023 CLINICAL DATA:  Colon cancer, staging. * Tracking Code: BO * EXAM: CT CHEST WITH CONTRAST TECHNIQUE: Multidetector CT imaging of the chest was performed during intravenous contrast administration. RADIATION DOSE REDUCTION: This exam was performed according to the departmental dose-optimization program which includes automated exposure control, adjustment of the mA and/or kV according to patient size and/or use of iterative reconstruction technique. CONTRAST:  75mL OMNIPAQUE IOHEXOL 300 MG/ML  SOLN COMPARISON:  None Available. FINDINGS: Cardiovascular:  No  pericardial effusion. No aortic aneurysm. Mediastinum/Nodes: There is a 1.4 x 1.9 cm hypoattenuating nodule in the inferior aspect of the isthmus, not well characterized on the current exam. The nodule meets the size criteria for follow-up nonemergent evaluation with thyroid ultrasound. No solid / cystic mediastinal masses. The esophagus is nondistended precluding optimal assessment. No axillary, mediastinal or hilar lymphadenopathy by size criteria. Lungs/Pleura: The central tracheo-bronchial tree is patent. There are patchy areas of linear, plate-like atelectasis and/or scarring throughout bilateral lungs. No mass or consolidation. No pleural effusion or pneumothorax. No suspicious lung nodules. Upper Abdomen: Visualized upper abdominal viscera within normal limits. Musculoskeletal: The visualized soft tissues of the chest wall are grossly unremarkable. No suspicious osseous lesions. There are mild multilevel degenerative changes in the visualized spine. IMPRESSION: 1. No evidence of metastatic disease in the chest. 2. 1.4 x 1.9 cm hypoattenuating nodule in the inferior aspect of the isthmus, not well characterized on the  current exam. The nodule meets the size criteria for follow-up nonemergent evaluation with thyroid ultrasound. Electronically Signed: By: Jules Schick M.D. On: 09/30/2023 15:22   MR ABDOMEN W WO CONTRAST  Result Date: 09/28/2023 CLINICAL DATA:  9 mm pancreatic cystic lesion on CT. EXAM: MRI ABDOMEN WITHOUT AND WITH CONTRAST TECHNIQUE: Multiplanar multisequence MR imaging of the abdomen was performed both before and after the administration of intravenous contrast. CONTRAST:  5mL GADAVIST GADOBUTROL 1 MMOL/ML IV SOLN COMPARISON:  CT of 1 day prior. FINDINGS: Portions of exam are mild to moderately motion degraded. Lower chest: Mild cardiomegaly, without pericardial or pleural effusion. Hepatobiliary: Normal liver. Normal gallbladder, without biliary ductal dilatation. Pancreas: No main  pancreatic duct dilatation. Corresponding the CT abnormality, within the pancreatic body/tail junction, is a cystic lesion which measures up to 9 mm on 19/24. No suspicious postcontrast characteristics including on 42/19. No peripancreatic edema. Spleen:  Normal in size, without focal abnormality. Adrenals/Urinary Tract: Normal adrenal glands. Dominant lower pole right renal cyst at 6.3 cm does not warrant imaging follow-up. Normal left kidney. No hydronephrosis. Stomach/Bowel: Normal stomach and small bowel. Colonic obstruction as detailed on CT. Vascular/Lymphatic: Aortic atherosclerosis. No retroperitoneal or retrocrural adenopathy. Other:  No ascites. Musculoskeletal: No acute osseous abnormality. IMPRESSION: 1. Mild to moderately motion degraded exam. 2. 9 mm cystic lesion within the pancreatic body/tail junction with differential considerations of pseudocyst or indolent cystic neoplasm. Per consensus criteria, this warrants follow-up with pre and post contrast abdominal MRI at 1 year. This recommendation follows ACR consensus guidelines: Management of Incidental Pancreatic Cysts: A White Paper of the ACR Incidental Findings Committee. J Am Coll Radiol 2017;14:911-923. 3.  Aortic Atherosclerosis (ICD10-I70.0). 4. Colonic obstruction, as on CT. Electronically Signed   By: Jeronimo Greaves M.D.   On: 09/28/2023 08:52   MR 3D Recon At Scanner  Result Date: 09/28/2023 CLINICAL DATA:  9 mm pancreatic cystic lesion on CT. EXAM: MRI ABDOMEN WITHOUT AND WITH CONTRAST TECHNIQUE: Multiplanar multisequence MR imaging of the abdomen was performed both before and after the administration of intravenous contrast. CONTRAST:  5mL GADAVIST GADOBUTROL 1 MMOL/ML IV SOLN COMPARISON:  CT of 1 day prior. FINDINGS: Portions of exam are mild to moderately motion degraded. Lower chest: Mild cardiomegaly, without pericardial or pleural effusion. Hepatobiliary: Normal liver. Normal gallbladder, without biliary ductal dilatation.  Pancreas: No main pancreatic duct dilatation. Corresponding the CT abnormality, within the pancreatic body/tail junction, is a cystic lesion which measures up to 9 mm on 19/24. No suspicious postcontrast characteristics including on 42/19. No peripancreatic edema. Spleen:  Normal in size, without focal abnormality. Adrenals/Urinary Tract: Normal adrenal glands. Dominant lower pole right renal cyst at 6.3 cm does not warrant imaging follow-up. Normal left kidney. No hydronephrosis. Stomach/Bowel: Normal stomach and small bowel. Colonic obstruction as detailed on CT. Vascular/Lymphatic: Aortic atherosclerosis. No retroperitoneal or retrocrural adenopathy. Other:  No ascites. Musculoskeletal: No acute osseous abnormality. IMPRESSION: 1. Mild to moderately motion degraded exam. 2. 9 mm cystic lesion within the pancreatic body/tail junction with differential considerations of pseudocyst or indolent cystic neoplasm. Per consensus criteria, this warrants follow-up with pre and post contrast abdominal MRI at 1 year. This recommendation follows ACR consensus guidelines: Management of Incidental Pancreatic Cysts: A White Paper of the ACR Incidental Findings Committee. J Am Coll Radiol 2017;14:911-923. 3.  Aortic Atherosclerosis (ICD10-I70.0). 4. Colonic obstruction, as on CT. Electronically Signed   By: Jeronimo Greaves M.D.   On: 09/28/2023 08:52   CT ABDOMEN PELVIS W CONTRAST  Result  Date: 09/27/2023 CLINICAL DATA:  Acute abdominal pain. EXAM: CT ABDOMEN AND PELVIS WITH CONTRAST TECHNIQUE: Multidetector CT imaging of the abdomen and pelvis was performed using the standard protocol following bolus administration of intravenous contrast. RADIATION DOSE REDUCTION: This exam was performed according to the departmental dose-optimization program which includes automated exposure control, adjustment of the mA and/or kV according to patient size and/or use of iterative reconstruction technique. CONTRAST:  OMNIPAQUE IOHEXOL  300 MG/ML  SOLN COMPARISON:  CT abdomen and pelvis 09/20/2023 FINDINGS: Lower chest: No acute abnormality. Hepatobiliary: There is diffuse fatty infiltration of the liver. Gallbladder and bile ducts are within normal limits. Pancreas: There is a rounded hypodensity in the tail of the pancreas measuring 9 mm which appears unchanged. No pancreatic ductal dilatation or surrounding inflammatory changes. Spleen: Normal in size without focal abnormality. Adrenals/Urinary Tract: Mildly complex right renal cyst is again seen with thin peripheral septation. This measures 6.9 cm and appears unchanged. Otherwise, the bilateral kidneys, adrenal gland glands, and bladder are within normal limits. Stomach/Bowel: There is wall thickening of the short segment of the proximal sigmoid colon. Upstream to this level of the colon is dilated containing air-fluid levels in the large amount of stool. The appendix is not seen. Small bowel loops and stomach are decompressed. Vascular/Lymphatic: Aortic atherosclerosis. No enlarged abdominal or pelvic lymph nodes. Reproductive: Status post hysterectomy. No adnexal masses. Other: There is a small amount of fluid in the pelvis. There is no focal abdominal wall hernia. There is no free air. Musculoskeletal: Subcentimeter lucent lesion in the L1 vertebral body, indeterminate. No acute fractures are seen. IMPRESSION: 1. Wall thickening of the proximal sigmoid colon worrisome for neoplasm. Focal colitis not excluded. There is upstream colonic dilatation with air-fluid levels and large amount of stool compatible with obstruction. 2. Small amount of free fluid in the pelvis. 3. Fatty infiltration of the liver. 4. Stable 9 mm cystic lesion in the tail of the pancreas. This can be further evaluated with MRI. 5. Stable mildly complex right renal cyst. No follow-up imaging recommended. 6. Subcentimeter lucent lesion in the L1 vertebral body, indeterminate. Aortic Atherosclerosis (ICD10-I70.0).  Electronically Signed   By: Darliss Cheney M.D.   On: 09/27/2023 22:27   CT ABDOMEN PELVIS W CONTRAST  Result Date: 09/20/2023 CLINICAL DATA:  Severe lower abdominal pain. EXAM: CT ABDOMEN AND PELVIS WITH CONTRAST TECHNIQUE: Multidetector CT imaging of the abdomen and pelvis was performed using the standard protocol following bolus administration of intravenous contrast. RADIATION DOSE REDUCTION: This exam was performed according to the departmental dose-optimization program which includes automated exposure control, adjustment of the mA and/or kV according to patient size and/or use of iterative reconstruction technique. CONTRAST:  OMNIPAQUE IOHEXOL 300 MG/ML  SOLN COMPARISON:  01/19/2006. FINDINGS: Lower chest: Mild atelectasis or scarring at the lung bases. Hepatobiliary: No focal liver abnormality is seen. Fatty infiltration of the liver is noted. No gallstones, gallbladder wall thickening, or biliary dilatation. Pancreas: Unremarkable. No pancreatic ductal dilatation or surrounding inflammatory changes. Spleen: Normal in size without focal abnormality. Adrenals/Urinary Tract: The adrenal glands are within normal limits. The kidneys enhance symmetrically. A cyst is noted in the lower pole of the right kidney. Subcentimeter hypodensities are present in the left kidney which are too small to further characterize. A nonobstructive left renal calculus is seen. No hydroureteronephrosis bilaterally. Stomach/Bowel: Stomach is within normal limits. No bowel obstruction, free air, or pneumatosis. Scattered diverticula are noted along the colon. A moderate amount of retained stool is  present in the colon. The appendix is not seen. Multiple loops of small bowel are noted in the mid left abdomen and pelvis with wall thickening, mucosal enhancement, and surrounding fat stranding. There is associated wall thickening and fat stranding at the sigmoid colon. Vascular/Lymphatic: Aortic atherosclerosis. No enlarged  abdominal or pelvic lymph nodes. Reproductive: Status post hysterectomy. No adnexal masses. Other: A small amount of free fluid is noted in the pelvis and presacral space. Musculoskeletal: Degenerative changes are present in the thoracolumbar spine. There stable deformity of the iliac bone on the left. No acute osseous abnormality. IMPRESSION: 1. Bowel wall thickening, mucosal enhancement, and surrounding fat stranding involving loops of small bowel in the mid left abdomen and pelvis with associated involvement of the sigmoid colon, possible infectious or inflammatory enteritis/colitis. No bowel obstruction is seen. 2. Moderate amount of retained stool in the colon suggesting constipation. 3. Diverticulosis without diverticulitis. 4. Nonobstructive left renal calculus. 5. Hepatic steatosis. 6. Aortic atherosclerosis. Electronically Signed   By: Thornell Sartorius M.D.   On: 09/20/2023 21:52   DG Chest Port 1 View  Result Date: 09/20/2023 CLINICAL DATA:  Sepsis.  Fever. EXAM: PORTABLE CHEST 1 VIEW COMPARISON:  04/09/2016 FINDINGS: The cardiomediastinal contours are normal. The lungs are clear. Pulmonary vasculature is normal. No consolidation, pleural effusion, or pneumothorax. No acute osseous abnormalities are seen. IMPRESSION: No active disease. Electronically Signed   By: Narda Rutherford M.D.   On: 09/20/2023 20:39    Labs:  CBC: Recent Labs    10/07/23 0313 10/08/23 0318 10/10/23 0312 10/11/23 1255  WBC 13.9* 12.6* 9.3 9.8  HGB 10.3* 11.1* 12.5 12.7  HCT 32.1* 34.3* 39.1 39.2  PLT 250 293 347 355    COAGS: Recent Labs    09/20/23 1902 10/05/23 0247 10/11/23 1255  INR 1.1 1.1 1.1  APTT  --  30  --     BMP: Recent Labs    10/07/23 0313 10/08/23 0318 10/10/23 0312 10/11/23 0952  NA 140 138 141 136  K 3.5 3.4* 3.1* 3.2*  CL 103 101 100 99  CO2 28 26 28 26   GLUCOSE 104* 96 108* 122*  BUN 27* 26* 27* 18  CALCIUM 8.5* 8.5* 8.6* 8.5*  CREATININE 0.72 0.63 0.65 0.47  GFRNONAA  >60 >60 >60 >60    LIVER FUNCTION TESTS: Recent Labs    09/20/23 1838 09/27/23 2007 10/07/23 0313 10/08/23 0318  BILITOT 1.2 0.6 1.4* 1.6*  AST 31 23 22 23   ALT 48* 28 23 25   ALKPHOS 105 91 90 107  PROT 8.0 7.8 5.7* 5.8*  ALBUMIN 3.3* 3.7 2.3* 2.4*    TUMOR MARKERS: No results for input(s): "AFPTM", "CEA", "CA199", "CHROMGRNA" in the last 8760 hours.  Assessment and Plan:  Bowel obstruction s/p resection and colostomy; post-op anastomotic leak with pelvic fluid collection: Elizabeth Odom, 82 year old female, is tentatively scheduled 10/12/23 for an image-guided pelvic fluid collection aspiration with drain placement. The drain will be placed transgluteal and the patient is aware.   Risks and benefits discussed with the patient including bleeding, infection, damage to adjacent structures, bowel perforation/fistula connection, and sepsis.  All of the patient's questions were answered, patient is agreeable to proceed. She will be NPO at midnight. AM labs ordered.   Consent signed and in IR.   Thank you for this interesting consult.  I greatly enjoyed meeting Elizabeth Odom and look forward to participating in their care.  A copy of this report was sent to the requesting provider  on this date.  Electronically Signed: Alwyn Ren, AGACNP-BC [phone] 10/11/2023, 3:17 PM   I spent a total of 20 Minutes    in face to face in clinical consultation, greater than 50% of which was counseling/coordinating care for abscess drain.

## 2023-10-11 NOTE — Progress Notes (Signed)
Patient ID: Elizabeth Odom, female   DOB: [birth year], 82 y.o.   MRN: [id number]   I have reviewed the patient's CT scan of the abdomen and pelvis.  Unfortunately, this shows a small leak at her anastomosis going into a 6 x 7 cm fluid collection.  Contrast does make it past this area and into the colon. I explained the CT findings to her.  We are going to get interventional radiology's opinion as to whether or not this collection can be drained.  We will continue the NG and bowel rest.  She may need to be started on TNA.  I explained the CT findings to her.  She says she would refuse any surgical intervention at this point IR RN able to do anything.

## 2023-10-11 NOTE — Plan of Care (Signed)
  Problem: Education: Goal: Ability to describe self-care measures that may prevent or decrease complications (Diabetes Survival Skills Education) will improve Outcome: Progressing Goal: Individualized Educational Video(s) Outcome: Progressing   Problem: Health Behavior/Discharge Planning: Goal: Ability to identify and utilize available resources and services will improve Outcome: Progressing Goal: Ability to manage health-related needs will improve Outcome: Progressing   Problem: Metabolic: Goal: Ability to maintain appropriate glucose levels will improve Outcome: Progressing   Problem: Nutritional: Goal: Progress toward achieving an optimal weight will improve Outcome: Progressing   Problem: Skin Integrity: Goal: Risk for impaired skin integrity will decrease Outcome: Progressing   Problem: Tissue Perfusion: Goal: Adequacy of tissue perfusion will improve Outcome: Progressing   Problem: Education: Goal: Knowledge of General Education information will improve Description: Including pain rating scale, medication(s)/side effects and non-pharmacologic comfort measures Outcome: Progressing   Problem: Health Behavior/Discharge Planning: Goal: Ability to manage health-related needs will improve Outcome: Progressing   Problem: Clinical Measurements: Goal: Ability to maintain clinical measurements within normal limits will improve Outcome: Progressing Goal: Will remain free from infection Outcome: Progressing Goal: Diagnostic test results will improve Outcome: Progressing Goal: Respiratory complications will improve Outcome: Progressing Goal: Cardiovascular complication will be avoided Outcome: Progressing   Problem: Activity: Goal: Risk for activity intolerance will decrease Outcome: Progressing   Problem: Nutrition: Goal: Adequate nutrition will be maintained Outcome: Progressing   Problem: Coping: Goal: Level of anxiety will decrease Outcome: Progressing    Problem: Elimination: Goal: Will not experience complications related to bowel motility Outcome: Progressing Goal: Will not experience complications related to urinary retention Outcome: Progressing   Problem: Pain Managment: Goal: General experience of comfort will improve Outcome: Progressing   Problem: Safety: Goal: Ability to remain free from injury will improve Outcome: Progressing   Problem: Skin Integrity: Goal: Risk for impaired skin integrity will decrease Outcome: Progressing   Problem: Education: Goal: Understanding of CV disease, CV risk reduction, and recovery process will improve Outcome: Progressing Goal: Individualized Educational Video(s) Outcome: Progressing   Problem: Activity: Goal: Ability to return to baseline activity level will improve Outcome: Progressing   Problem: Cardiovascular: Goal: Ability to achieve and maintain adequate cardiovascular perfusion will improve Outcome: Progressing Goal: Vascular access site(s) Level 0-1 will be maintained Outcome: Progressing   Problem: Health Behavior/Discharge Planning: Goal: Ability to safely manage health-related needs after discharge will improve Outcome: Progressing   Problem: Coping: Goal: Ability to adjust to condition or change in health will improve Outcome: Not Progressing   Problem: Fluid Volume: Goal: Ability to maintain a balanced intake and output will improve Outcome: Not Progressing   Problem: Nutritional: Goal: Maintenance of adequate nutrition will improve Outcome: Not Progressing

## 2023-10-11 NOTE — Progress Notes (Signed)
[facility]                                                                               Elizabeth Odom, is a 82 y.o. female, DOB - 1941/01/23, WUJ:[id number] Admit date - 09/27/2023    Outpatient Primary MD for the patient is [name], FNP  LOS - 14  days    Brief summary   82 year old F with PMH of A-fib, RLS and anemia presented to ED on 10/17 with recurrent abdominal pain, nausea, emesis, diarrhea and intolerance of oral intake.  Patient was seen in ED on 10/10 for abdominal pain, decreased appetite and fever and discharged on Augmentin for possible colitis noted on CT abdomen and pelvis.  However, she has recurrence of his symptoms after interval improvement and presented back to the hospital, hospitalist called for admission, GI and general surgery called for consult.   CT initially showed proximal sigmoid colon wall thickening with upstream colonic dilation with air-fluid levels and large amount of stool compatible with obstruction.  There is also a 9 mm cystic lesion in the tail of pancreas, and subcentimeter lucent lesion in L1 vertebral body.  GI consulted, patient refused colonoscopy but agreed to sigmoidoscopy showing stricture versus mass at the rectosigmoid region -scope could not be advanced past this position.  Surgery consulted -status post ex lap with small bowel resection and Hartman's resection and colostomy on 10/01/2023.  Patient continues to have postop ileus, managed by general surgery.  NG remains in place to suction.   Hospital course complicated by acute PE and LLE DVT for which she was started on heparin -after which she unfortunately developed profound acute blood loss anemia with subsequent pelvic hematoma near the small bowel anastomosis as well as notable hematuria.  Urology consulted, signed off after resolution of hematuria. Given high risk of bleed with notable PE/DVT patient was evaluated by IR who placed IVC filter on 10/05/2023.  Patient  required total of 4 unit of blood transfusion, hemoglobin now stabilizing -no indication for further imaging or intervention at this time.   Patient continues to work with PT OT, patient will likely require placement given her prolonged hospitalization, weakness, and diminished ambulatory status from baseline.    Assessment & Plan    Assessment and Plan:  Large Bowel Obstruction:  10/17 CT with proximal sigmoid colon thickening.  10/19 sigmoidoscopy with tight stricture - was found to have a mass biopsy negative for malignancy as suggesting inflammation.  10/21 s/p ex lap hartman's resection , small bowel resection and ostomy.   Repeat CT abd pelvis ordered showed a small leak at her anastomosis going into a 6 x 7 cm fluid collection . Gen surgery on board and IR consulted to see if it can be drained.  Pain controlled. NPO except for ice chips and sips of coke.   Post  operative ileus vs partial SBO Exacerbated by intra abd bleeding.  CT abd and pelvis done and results explained.    Acute blood loss anemia sec to pelvic/ peri anastomotic hematomas with Hematuria S/p 4 units of prbc transfusions.  S/p IVC filter,  Urology recommended no indication for cystoscopy or intervention.  Acute PE with right heart strain with acute left leg DVT Unable to tolerate anti coagulation , hence IVc filter placed on 10/25    Nodule in the inf aspect of the isthmus Thyroid US - follow up outpatient.    PAF Rate controlled. Not on anti coagulation.    Hyponatremia  Resolved.    Hypokalemia:  Replaced. Repeat levels in the morning.   Leukocytosis:  Resolved.    Goals of care: Changed to DNR/DNI 10/24. -Palliative medicine on board   Body mass index is 23.15 kg/m.      RN Pressure Injury Documentation: Pressure Injury 10/08/23 Buttocks Right;Mid Stage 2 -  Partial thickness loss of dermis presenting as a shallow open injury with a red, pink wound bed without slough.  (Active)  10/08/23 0800  Location: Buttocks  Location Orientation: Right;Mid  Staging: Stage 2 -  Partial thickness loss of dermis presenting as a shallow open injury with a red, pink wound bed without slough.  Wound Description (Comments):   Present on Admission:   Dressing Type None 10/11/23 0800     Estimated body mass index is 23.63 kg/m as calculated from the following:   Height as of this encounter: 5\' 5"  (1.651 m).   Weight as of this encounter: 64.4 kg.  Code Status: DNR limited.  DVT Prophylaxis:  Place and maintain sequential compression device Start: 10/06/23 1319   Level of Care: Level of care: Stepdown Family Communication: None at bedside.   Disposition Plan:     Remains inpatient appropriate:  NPO, fluid collection at the site of the anastomosis.   Procedures:  CT abd and pelvis.   Consultants:   General surgery.  IR  Antimicrobials:   Anti-infectives (From admission, onward)    Start     Dose/Rate Route Frequency Ordered Stop   10/05/23 1200  piperacillin-tazobactam (ZOSYN) IVPB 3.375 g        3.375 g 12.5 mL/hr over 240 Minutes Intravenous Every 8 hours 10/05/23 0721 10/09/23 1716   10/04/23 2000  piperacillin-tazobactam (ZOSYN) IVPB 3.375 g  Status:  Discontinued        3.375 g 12.5 mL/hr over 240 Minutes Intravenous Every 8 hours 10/04/23 1827 10/05/23 0721   09/27/23 2315  piperacillin-tazobactam (ZOSYN) IVPB 3.375 g  Status:  Discontinued        3.375 g 12.5 mL/hr over 240 Minutes Intravenous Every 8 hours 09/27/23 2305 10/02/23 1106        Medications  Scheduled Meds:  bisacodyl  10 mg Rectal Once   bisacodyl  10 mg Rectal Daily   Chlorhexidine Gluconate Cloth  6 each Topical Daily   insulin aspart  0-9 Units Subcutaneous Q4H   Continuous Infusions: PRN Meds:.acetaminophen **OR** acetaminophen, HYDROmorphone (DILAUDID) injection, liver oil-zinc oxide, LORazepam, ondansetron **OR** ondansetron (ZOFRAN) IV, phenol, polyvinyl alcohol,  prochlorperazine    Subjective:   Elizabeth Odom was seen and examined today.  No new complaints.   Objective:   Vitals:   10/11/23 0700 10/11/23 0723 10/11/23 0800 10/11/23 1105  BP: 122/60  136/72   Pulse: 91 72 71   Resp: 16 (!) 22 18   Temp:  98.1 F (36.7 C)  98.7 F (37.1 C)  TempSrc:  Oral  Oral  SpO2: 91% 93% 92%   Weight:      Height:        Intake/Output Summary (Last 24 hours) at 10/11/2023 1431 Last data filed at 10/11/2023 0600 Gross per 24 hour  Intake 10 ml  Output  850 ml  Net -840 ml   Filed Weights   10/09/23 0500 10/10/23 0500 10/11/23 0500  Weight: 59.2 kg 57 kg 64.4 kg     Exam General exam: Appears calm and comfortable  Respiratory system: Clear to auscultation. Respiratory effort normal. Cardiovascular system: S1 & S2 heard, RRR.  Gastrointestinal system: Abdomen is mildly distended.  soft and mildly tender. Stoma with air in the bag.  Central nervous system: Alert and oriented.  Extremities: no edema.  Skin: No rashes, Psychiatry:  Mood & affect appropriate.    Data Reviewed:  I have personally reviewed following labs and imaging studies   CBC Lab Results  Component Value Date   WBC 9.8 10/11/2023   RBC 4.09 10/11/2023   HGB 12.7 10/11/2023   HCT 39.2 10/11/2023   MCV 95.8 10/11/2023   MCH 31.1 10/11/2023   PLT 355 10/11/2023   MCHC 32.4 10/11/2023   RDW 15.2 10/11/2023   LYMPHSABS 0.7 10/07/2023   MONOABS 1.0 10/07/2023   EOSABS 0.2 10/07/2023   BASOSABS 0.0 10/07/2023     Last metabolic panel Lab Results  Component Value Date   NA 136 10/11/2023   K 3.2 (L) 10/11/2023   CL 99 10/11/2023   CO2 26 10/11/2023   BUN 18 10/11/2023   CREATININE 0.47 10/11/2023   GLUCOSE 122 (H) 10/11/2023   GFRNONAA >60 10/11/2023   GFRAA >60 04/10/2016   CALCIUM 8.5 (L) 10/11/2023   PHOS 3.0 10/07/2023   PROT 5.8 (L) 10/08/2023   ALBUMIN 2.4 (L) 10/08/2023   BILITOT 1.6 (H) 10/08/2023   ALKPHOS 107 10/08/2023   AST 23 10/08/2023    ALT 25 10/08/2023   ANIONGAP 11 10/11/2023    CBG (last 3)  Recent Labs    10/11/23 0319 10/11/23 0716 10/11/23 1103  GLUCAP 109* 124* 147*      Coagulation Profile: Recent Labs  Lab 10/05/23 0247 10/11/23 1255  INR 1.1 1.1     Radiology Studies: CT ABDOMEN PELVIS W CONTRAST  Result Date: 10/11/2023 CLINICAL DATA:  Postoperative abdominal pain. EXAM: CT ABDOMEN AND PELVIS WITH CONTRAST TECHNIQUE: Multidetector CT imaging of the abdomen and pelvis was performed using the standard protocol following bolus administration of intravenous contrast. RADIATION DOSE REDUCTION: This exam was performed according to the departmental dose-optimization program which includes automated exposure control, adjustment of the mA and/or kV according to patient size and/or use of iterative reconstruction technique. CONTRAST:  OMNIPAQUE IOHEXOL 300 MG/ML  SOLN COMPARISON:  October 04, 2023. FINDINGS: Lower chest: Mild bilateral posterior basilar subsegmental atelectasis is noted. Hepatobiliary: No focal liver abnormality is seen. No gallstones, gallbladder wall thickening, or biliary dilatation. Pancreas: Unremarkable. No pancreatic ductal dilatation or surrounding inflammatory changes. Spleen: Normal in size without focal abnormality. Adrenals/Urinary Tract: Adrenal glands appear normal. Stable right renal cyst for which no further follow-up is required. Small left renal calculus. No hydronephrosis or renal obstruction is noted. Urinary bladder is unremarkable. Stomach/Bowel: Nasogastric tube tip is seen in stomach. Colostomy is noted in left lower quadrant with Hartmann's pouch. Status post partial small bowel obstruction and partial left colectomy. No significant bowel dilatation is noted. Small bowel surgical anastomosis is noted in the pelvis. There appears to be extravasation of contrast in this area suggesting possible bowel perforation. This is best seen on image number 60 of series 7. Contrast  appears to be extending into posterior pelvic complex fluid collection which measures 7 x 6 cm. There is high density material within this suggesting clot.  Vascular/Lymphatic: Aortic atherosclerosis. No enlarged abdominal or pelvic lymph nodes. IVC filter is noted in infrarenal position. Reproductive: Status post hysterectomy. No adnexal masses. Other: Midline surgical incision is noted anteriorly in the pelvis. No definite hernia is noted. Musculoskeletal: No acute or significant osseous findings. IMPRESSION: Extensive postsurgical changes are noted, with colostomy seen in left lower quadrant as well as Hartmann's pouch. Status post partial left colectomy. Small bowel anastomotic site is noted in the pelvis, and there does appear to be extravasation of contrast in this area suggesting perforation or leakage. Contrast is seen extending into 7 x 6 cm complex fluid collection posteriorly in the pelvis which appears to contain some degree of clot as well. These results will be called to the ordering clinician or representative by the Radiologist Assistant, and communication documented in the PACS or zVision Dashboard. Aortic Atherosclerosis (ICD10-I70.0). Electronically Signed   By: Lupita Raider M.D.   On: 10/11/2023 11:44       Kathlen Mody M.D. [facility] 10/11/2023, 2:31 PM  Available via Epic secure chat 7am-7pm After 7 pm, please refer to night coverage provider listed on amion.

## 2023-10-12 ENCOUNTER — Inpatient Hospital Stay (HOSPITAL_COMMUNITY): Payer: PPO

## 2023-10-12 DIAGNOSIS — K56609 Unspecified intestinal obstruction, unspecified as to partial versus complete obstruction: Secondary | ICD-10-CM | POA: Diagnosis not present

## 2023-10-12 DIAGNOSIS — D62 Acute posthemorrhagic anemia: Secondary | ICD-10-CM | POA: Diagnosis not present

## 2023-10-12 DIAGNOSIS — I824Y2 Acute embolism and thrombosis of unspecified deep veins of left proximal lower extremity: Secondary | ICD-10-CM | POA: Diagnosis not present

## 2023-10-12 LAB — CBC
HCT: 38.4 % (ref 36.0–46.0)
Hemoglobin: 12.3 g/dL (ref 12.0–15.0)
MCH: 31 pg (ref 26.0–34.0)
MCHC: 32 g/dL (ref 30.0–36.0)
MCV: 96.7 fL (ref 80.0–100.0)
Platelets: 348 10*3/uL (ref 150–400)
RBC: 3.97 MIL/uL (ref 3.87–5.11)
RDW: 15.2 % (ref 11.5–15.5)
WBC: 8.6 10*3/uL (ref 4.0–10.5)
nRBC: 0 % (ref 0.0–0.2)

## 2023-10-12 LAB — COMPREHENSIVE METABOLIC PANEL
ALT: 23 U/L (ref 0–44)
AST: 23 U/L (ref 15–41)
Albumin: 2.5 g/dL — ABNORMAL LOW (ref 3.5–5.0)
Alkaline Phosphatase: 78 U/L (ref 38–126)
Anion gap: 11 (ref 5–15)
BUN: 18 mg/dL (ref 8–23)
CO2: 26 mmol/L (ref 22–32)
Calcium: 8.7 mg/dL — ABNORMAL LOW (ref 8.9–10.3)
Chloride: 102 mmol/L (ref 98–111)
Creatinine, Ser: 0.64 mg/dL (ref 0.44–1.00)
GFR, Estimated: >60 mL/min (ref >60)
Glucose, Bld: 102 mg/dL — ABNORMAL HIGH (ref 70–99)
Potassium: 3.3 mmol/L — ABNORMAL LOW (ref 3.5–5.1)
Sodium: 139 mmol/L (ref 135–145)
Total Bilirubin: 1.2 mg/dL (ref 0.3–1.2)
Total Protein: 6.1 g/dL — ABNORMAL LOW (ref 6.5–8.1)

## 2023-10-12 LAB — GLUCOSE, CAPILLARY
Glucose-Capillary: 104 mg/dL — ABNORMAL HIGH (ref 70–99)
Glucose-Capillary: 112 mg/dL — ABNORMAL HIGH (ref 70–99)
Glucose-Capillary: 114 mg/dL — ABNORMAL HIGH (ref 70–99)
Glucose-Capillary: 119 mg/dL — ABNORMAL HIGH (ref 70–99)
Glucose-Capillary: 136 mg/dL — ABNORMAL HIGH (ref 70–99)
Glucose-Capillary: 88 mg/dL (ref 70–99)

## 2023-10-12 LAB — PHOSPHORUS: Phosphorus: 2.9 mg/dL (ref 2.5–4.6)

## 2023-10-12 LAB — PROTIME-INR
INR: 1.1 (ref 0.8–1.2)
Prothrombin Time: 14.8 s (ref 11.4–15.2)

## 2023-10-12 LAB — MAGNESIUM: Magnesium: 2.1 mg/dL (ref 1.7–2.4)

## 2023-10-12 MED ORDER — MIDAZOLAM HCL 2 MG/2ML IJ SOLN
INTRAMUSCULAR | Status: AC
  Filled 2023-10-12: qty 2

## 2023-10-12 MED ORDER — TRACE MINERALS CU-MN-SE-ZN 300-55-60-3000 MCG/ML IV SOLN
INTRAVENOUS | Status: AC
  Filled 2023-10-12 (×2): qty 1000

## 2023-10-12 MED ORDER — POTASSIUM CHLORIDE 10 MEQ/100ML IV SOLN: 10.0000 meq | INTRAVENOUS | Status: DC

## 2023-10-12 MED ORDER — SODIUM CHLORIDE 0.9% FLUSH: 10.0000 mL | INTRAVENOUS | Status: DC | PRN

## 2023-10-12 MED ORDER — POTASSIUM CHLORIDE 10 MEQ/100ML IV SOLN
10.0000 meq | INTRAVENOUS | Status: AC
  Administered 2023-10-12 (×4): 10 meq via INTRAVENOUS
  Filled 2023-10-12 (×4): qty 100

## 2023-10-12 MED ORDER — FENTANYL CITRATE (PF) 100 MCG/2ML IJ SOLN
INTRAMUSCULAR | Status: AC
  Filled 2023-10-12: qty 4

## 2023-10-12 MED ORDER — FENTANYL CITRATE (PF) 100 MCG/2ML IJ SOLN
INTRAMUSCULAR | Status: AC
  Filled 2023-10-12: qty 2

## 2023-10-12 MED ORDER — SODIUM CHLORIDE 0.9% FLUSH
5.0000 mL | Freq: Three times a day (TID) | INTRAVENOUS | Status: DC
Start: 2023-10-12 — End: 2023-10-24
  Administered 2023-10-12 – 2023-10-24 (×28): 5 mL

## 2023-10-12 MED ORDER — MIDAZOLAM HCL 2 MG/2ML IJ SOLN
INTRAMUSCULAR | Status: AC | PRN
  Administered 2023-10-12 (×3): .5 mg via INTRAVENOUS

## 2023-10-12 MED ORDER — FENTANYL CITRATE (PF) 100 MCG/2ML IJ SOLN
INTRAMUSCULAR | Status: AC | PRN
  Administered 2023-10-12 (×3): 25 ug via INTRAVENOUS

## 2023-10-12 MED ORDER — KETOTIFEN FUMARATE 0.035 % OP SOLN
2.0000 [drp] | Freq: Two times a day (BID) | OPHTHALMIC | Status: DC
  Administered 2023-10-12 – 2023-11-09 (×54): 2 [drp] via OPHTHALMIC
  Filled 2023-10-12 (×3): qty 5

## 2023-10-12 MED ORDER — FAT EMUL FISH OIL/PLANT BASED 20% (SMOFLIPID)IV EMUL
250.0000 mL | INTRAVENOUS | Status: AC
  Administered 2023-10-12: 250 mL via INTRAVENOUS
  Filled 2023-10-12: qty 250

## 2023-10-12 MED ORDER — SODIUM CHLORIDE 0.9% FLUSH
10.0000 mL | Freq: Two times a day (BID) | INTRAVENOUS | Status: DC
  Administered 2023-10-12: 20 mL
  Administered 2023-10-12 – 2023-10-15 (×6): 10 mL
  Administered 2023-10-16: 20 mL
  Administered 2023-10-16: 30 mL
  Administered 2023-10-17 – 2023-11-08 (×29): 10 mL

## 2023-10-12 MED ORDER — MIDAZOLAM HCL 2 MG/2ML IJ SOLN
INTRAMUSCULAR | Status: AC
  Filled 2023-10-12: qty 4

## 2023-10-12 NOTE — Procedures (Signed)
Vascular and Interventional Radiology Procedure Note  Patient: Elizabeth Odom DOB: [birth year] Medical Record Number: [id number] Note Date/Time: 10/12/23 3:44 PM   Performing Physician: Roanna Banning, MD Assistant(s): None  Diagnosis: Q anastomotic leak with pelvic abscess  Procedure: DRAINAGE CATHETER PLACEMENT into a PELVIC ABSCESS  Anesthesia: Conscious Sedation Complications: None Estimated Blood Loss: Minimal Specimens: Sent for Gram Stain, Aerobe Culture, and Anerobe Culture  Findings:  Successful CT-guided placement of 12 F catheter into pelvic abscess.  Plan:  - Flush drain with 5 mL Normal Saline every 8 hours. - Follow up drain evaluation / sinogram in 10 day(s).  See detailed procedure note with images in PACS. The patient tolerated the procedure well without incident or complication and was returned to Recovery in stable condition.    Roanna Banning, MD [facility] Radiology   Pager. [phone] Clinic. [phone]

## 2023-10-12 NOTE — Progress Notes (Addendum)
PHARMACY - TOTAL PARENTERAL NUTRITION CONSULT NOTE   Indication:  Prolonged ileus  Patient Measurements: Height: 5\' 5"  (165.1 cm) Weight: 62 kg (136 lb 11 oz) IBW/kg (Calculated) : 57 TPN AdjBW (KG): 62 Body mass index is 22.75 kg/m. Usual Weight: 64.5 on 10/04/2023  Assessment:  TPN per Pharmacy consult for this 82 yo female admitted on 09/27/2023 with recurrent abdominal pain, nausea, emesis, diarrhea and intolerance of oral intake. Surgery consulted -status post ex lap with small bowel resection and Hartman's resection and colostomy on 10/01/2023.  Patient continues to have postop ileus, managed by general surgery.  NG remains in place to suction. Yesterday's imaging shows anastomic leak. IR drain planned for today.  Glucose / Insulin: No PMH of DM.  Goal CBGs < 150. 24 hr glucose range: 102-147 24 hr units of insulin aspart usage: 2 Electrolytes:  K low at 3.3, other WNL including CoCa at 9.9 Goal K >= 4, mag >=2, phos ~3 Renal: SCr <1, BUN WNL Hepatic: Alk phos WNL, AST/ALT WNL, Total Bilirubin WNL, Total protein lowa at 6.1, albumin low at 2.5 Intake / Output; MIVF:  No MIVF Drains: NG/OG vented/dual lumen: 600, colostomy: 1x U output: 150 + 2 x GI Imaging: 10/10 CT Abdomen pelvis w contrast - small bowel enteritis/colitis that was also involving the sigmoid colon. No bowel obstruction seen.  10/17 CT Abdomen pelvis w/ contrast - wall thickening of the proximal sigmoid colon worrisome for neoplasm. Focal colitis not excluded. There is upstream colonic dilatation with air-fluid levels and large amount of stool compatible with obstruction.  There is also a 2.9 mm cystic mass at the tail of the pancreas.  10/18 MR abdomen w wo contrast - 9 mm cystic lesion within pancreatic body/tail junction, colonic obstruction 10/31 CT Abdomen pelvis w contrast - colostomy and Hartmann's pouch seen.  Appears to be extravasation of contrast suggesting perforation or leakage.  Using Clinimix  8/10 due to IVF shortage caused by [facility] per hospital policy. Will not be able to meet full nutrition goals due to limited stock and fluid restrictions. Will replace electrolytes and give lipids outside of Clinimix.   GI Surgeries / Procedures:  10/19 flexible sigmoidoscopy 10/21 colectomy with colostomy creation/Hartman procedure 11/1: planned CT guided visceral fluid drain by perc cath Central access: PICC line ordered TPN start date: 11/1 at 1800 following PICC placement  Nutritional Goals: Current TPN rate is 41.7 mL/hr + lipids (provides 80 g of protein and 1159 kcals per day)  RD Assessment: pending    Current Nutrition:  NPO  Plan:  Now: KCl 10 mEq IV q1h x 4  At 1800: Start Clinimix 8/10 TPN with electrolytes at 41.7 mL/hr Electrolytes in TPN: Na 35 mEq/L, K 30 mEq/L, Ca 4.5 mEq/L, Mg 5 mEq/L, and Phos 15 mmol/L. Cl:Ac 1:1 Add standard MVI and trace elements to TPN, no chromium due to shortage SMOF Lipid 20% over 12 hours   Continue q4h Sensitive SSI and adjust as needed  No MIVF Monitor TPN labs on Mon/Thurs, BMET with magnesium & phos tomorrow am  Lynden Ang, PharmD, BCPS 10/12/2023,7:57 AM

## 2023-10-12 NOTE — Progress Notes (Signed)
Physical Therapy Treatment Patient Details Name: Elizabeth Odom MRN: [id number] DOB: [birth year] Today's Date: 10/12/2023   History of Present Illness Patient is a 82 year old female who presented on 10/17 with acute colitis. on10/21, patient underwent ex laparotomy with hartman's resection and colostomy and small bowel resection. PMH: a fib, RLS, syncope, cervical CA, cervical spine fusions, anemia    PT Comments  Pt seen in step down ICU RM# [id number] General Comments: AxO x 3 very pleasant Lady.  Motivated.  Eager to "get moving", "I've been here too long".  Prior pt was Indep/driving/Aquatic Therapy @ [facility] Assisted OOB to amb.  General bed mobility comments: increased time with HOB elevated and bed pad to complete scooting to EOB.  Sat EOB x 5 min at Supervision level. General transfer comment: required + 2 side by side assist to stand from elevated bed onto B platform EVA walker. General Gait Details: last amb was a week ago so used EVA B Platform walker for increased support and to decrease Pt's anxiety/fear of falling.  Pt tolerated amb 35 feet with avg HR 82 and RA 97%.  Max c/o B LE weakness. Used recliner to return pt back to room.  Assisted back to bed per Nursing request for PICC line insertion. Pt will need ST Rehab at SNF to address mobility and functional decline prior to safely returning home.    If plan is discharge home, recommend the following: A lot of help with walking and/or transfers;A lot of help with bathing/dressing/bathroom;Assistance with cooking/housework;Help with stairs or ramp for entrance   Can travel by private vehicle     No  Equipment Recommendations  None recommended by PT    Recommendations for Other Services       Precautions / Restrictions Precautions Precautions: Fall Precaution Comments: abd surgery; colostomy; NG tube Restrictions Weight Bearing Restrictions: No     Mobility  Bed Mobility Overal bed mobility: Needs Assistance Bed Mobility:  Supine to Sit     Supine to sit: Min assist     General bed mobility comments: increased time with HOB elevated and bed pad to complete scooting to EOB.  Sat EOB x 5 min at Supervision level.    Transfers Overall transfer level: Needs assistance Equipment used: None, 2 person hand held assist Transfers: Sit to/from Stand Sit to Stand: From elevated surface, Min assist, +2 physical assistance, +2 safety/equipment           General transfer comment: required + 2 side by side assist to stand from elevated bed onto B platform EVA walker.    Ambulation/Gait Ambulation/Gait assistance: Min assist, +2 physical assistance, +2 safety/equipment Gait Distance (Feet): 35 Feet Assistive device: Fara Boros Gait Pattern/deviations: Step-through pattern, Decreased stride length Gait velocity: decreased     General Gait Details: last amb was a week ago so used EVA B Platform walker for increased support and to decrease Pt's anxiety/fear of falling.  Pt tolerated amb 35 feet with avg HR 82 and RA 97%.  Max c/o B LE weakness.   Stairs             Wheelchair Mobility     Tilt Bed    Modified Rankin (Stroke Patients Only)       Balance  Cognition Arousal: Alert Behavior During Therapy: WFL for tasks assessed/performed, Flat affect Overall Cognitive Status: Within Functional Limits for tasks assessed                                 General Comments: AxO x 3 very pleasant Lady.  Motivated.  Eager to "get moving", "I've been here too long".  Prior pt was Indep/driving/Aquatic Therapy @ [facility]        Exercises      General Comments        Pertinent Vitals/Pain Pain Assessment Pain Assessment: Faces Faces Pain Scale: Hurts little more Pain Location: abdomen Pain Descriptors / Indicators: Grimacing, Operative site guarding Pain Intervention(s): Monitored during session, Repositioned    Home  Living                          Prior Function            PT Goals (current goals can now be found in the care plan section) Progress towards PT goals: Progressing toward goals    Frequency    Min 1X/week      PT Plan      Co-evaluation              AM-PAC PT "6 Clicks" Mobility   Outcome Measure  Help needed turning from your back to your side while in a flat bed without using bedrails?: A Little Help needed moving from lying on your back to sitting on the side of a flat bed without using bedrails?: A Little Help needed moving to and from a bed to a chair (including a wheelchair)?: A Lot Help needed standing up from a chair using your arms (e.g., wheelchair or bedside chair)?: A Lot Help needed to walk in hospital room?: A Lot Help needed climbing 3-5 steps with a railing? : Total 6 Click Score: 13    End of Session Equipment Utilized During Treatment: Gait belt Activity Tolerance: Patient tolerated treatment well Patient left: with call bell/phone within reach;in chair;with nursing/sitter in room Nurse Communication: Mobility status PT Visit Diagnosis: Difficulty in walking, not elsewhere classified (R26.2);Muscle weakness (generalized) (M62.81)     Time: 5409-8119 PT Time Calculation (min) (ACUTE ONLY): 39 min  Charges:    $Gait Training: 8-22 mins $Therapeutic Activity: 23-37 mins PT General Charges $$ ACUTE PT VISIT: 1 Visit                     Felecia Shelling  PTA Acute  Rehabilitation Services Office M-F          [phone]

## 2023-10-12 NOTE — Progress Notes (Signed)
[facility]                                                                               Elizabeth Odom, is a 82 y.o. female, DOB - 07-22-1941, ZOX:[id number] Admit date - 09/27/2023    Outpatient Primary MD for the patient is Elizabeth Pilon, FNP  LOS - 15  days    Brief summary   82 year old F with PMH of A-fib, RLS and anemia presented to ED on 10/17 with recurrent abdominal pain, nausea, emesis, diarrhea and intolerance of oral intake.  Patient was seen in ED on 10/10 for abdominal pain, decreased appetite and fever and discharged on Augmentin for possible colitis noted on CT abdomen and pelvis.  However, she has recurrence of his symptoms after interval improvement and presented back to the hospital, hospitalist called for admission, GI and general surgery called for consult.   CT initially showed proximal sigmoid colon wall thickening with upstream colonic dilation with air-fluid levels and large amount of stool compatible with obstruction.  There is also a 9 mm cystic lesion in the tail of pancreas, and subcentimeter lucent lesion in L1 vertebral body.  GI consulted, patient refused colonoscopy but agreed to sigmoidoscopy showing stricture versus mass at the rectosigmoid region -scope could not be advanced past this position.  Surgery consulted -status post ex lap with small bowel resection and Hartman's resection and colostomy on 10/01/2023.  Patient continues to have postop ileus, managed by general surgery.  NG remains in place to suction.   Hospital course complicated by acute PE and LLE DVT for which she was started on heparin -after which she unfortunately developed profound acute blood loss anemia with subsequent pelvic hematoma near the small bowel anastomosis as well as notable hematuria.  Urology consulted, signed off after resolution of hematuria. Given high risk of bleed with notable PE/DVT patient was evaluated by IR who placed IVC filter on 10/05/2023.  Patient  required total of 4 unit of blood transfusion, hemoglobin now stabilizing -no indication for further imaging or intervention at this time.   Patient continues to work with PT OT, patient will likely require placement given her prolonged hospitalization, weakness, and diminished ambulatory status from baseline.    Assessment & Plan    Assessment and Plan:  Large Bowel Obstruction:  10/17 CT with proximal sigmoid colon thickening.  10/19 sigmoidoscopy with tight stricture - was found to have a mass biopsy negative for malignancy as suggesting inflammation.  10/21 s/p ex lap hartman's resection , small bowel resection and ostomy.   Repeat CT abd pelvis ordered showed a small leak at her anastomosis going into a 6 x 7 cm fluid collection . Gen surgery on board and IR consulted to see if it can be drained. Drainage catheter placement for pelvic abscess.  Will hold off on antibiotics as she remains afebrile and wbc count wnl and she just completed a course of zosyn. Patient is also adamant about not having any more surgical interventions.  Pain controlled. NPO except for ice chips and sips of coke.   Post  operative ileus vs partial SBO Exacerbated by intra abd bleeding.  CT abd and pelvis  done and results explained.    Acute blood loss anemia sec to pelvic/ peri anastomotic hematomas with Hematuria S/p 4 units of prbc transfusions.  S/p IVC filter,  Urology recommended no indication for cystoscopy or intervention.    Acute PE with right heart strain with acute left leg DVT Unable to tolerate anti coagulation , hence IVc filter placed on 10/25    Nodule in the inf aspect of the isthmus Thyroid US - follow up outpatient.    PAF Rate controlled. Not on anti coagulation.    Hyponatremia  Resolved.    Hypokalemia:  Replaced. Repeat levels in the morning.   Leukocytosis:  Resolved.    Goals of care: Changed to DNR/DNI 10/24. -Palliative medicine on board   Body mass  index is 23.15 kg/m.    Nutrition:  TPN started by Surgery.   RN Pressure Injury Documentation: Pressure Injury 10/08/23 Buttocks Right;Mid Stage 2 -  Partial thickness loss of dermis presenting as a shallow open injury with a red, pink wound bed without slough. (Active)  10/08/23 0800  Location: Buttocks  Location Orientation: Right;Mid  Staging: Stage 2 -  Partial thickness loss of dermis presenting as a shallow open injury with a red, pink wound bed without slough.  Wound Description (Comments):   Present on Admission:   Dressing Type None 10/11/23 0800     Estimated body mass index is 22.75 kg/m as calculated from the following:   Height as of this encounter: 5\' 5"  (1.651 m).   Weight as of this encounter: 62 kg.  Code Status: DNR limited.  DVT Prophylaxis:  Place and maintain sequential compression device Start: 10/06/23 1319   Level of Care: Level of care: Stepdown Family Communication: None at bedside.   Disposition Plan:     Remains inpatient appropriate: IR drain placement.   Procedures:  CT abd and pelvis.   Consultants:   General surgery.  IR  Antimicrobials:   Anti-infectives (From admission, onward)    Start     Dose/Rate Route Frequency Ordered Stop   10/05/23 1200  piperacillin-tazobactam (ZOSYN) IVPB 3.375 g        3.375 g 12.5 mL/hr over 240 Minutes Intravenous Every 8 hours 10/05/23 0721 10/09/23 1716   10/04/23 2000  piperacillin-tazobactam (ZOSYN) IVPB 3.375 g  Status:  Discontinued        3.375 g 12.5 mL/hr over 240 Minutes Intravenous Every 8 hours 10/04/23 1827 10/05/23 0721   09/27/23 2315  piperacillin-tazobactam (ZOSYN) IVPB 3.375 g  Status:  Discontinued        3.375 g 12.5 mL/hr over 240 Minutes Intravenous Every 8 hours 09/27/23 2305 10/02/23 1106        Medications  Scheduled Meds:  bisacodyl  10 mg Rectal Once   bisacodyl  10 mg Rectal Daily   Chlorhexidine Gluconate Cloth  6 each Topical Daily   insulin aspart  0-9 Units  Subcutaneous Q4H   ketotifen  2 drop Both Eyes BID   sodium chloride flush  10-40 mL Intracatheter Q12H   sodium chloride flush  5 mL Intracatheter Q8H   Continuous Infusions:  TPN (CLINIMIX-E) Adult     And   fat emul(SMOFlipid)     PRN Meds:.acetaminophen **OR** acetaminophen, HYDROmorphone (DILAUDID) injection, liver oil-zinc oxide, LORazepam, ondansetron **OR** ondansetron (ZOFRAN) IV, phenol, polyvinyl alcohol, prochlorperazine, sodium chloride flush    Subjective:   Elizabeth Odom was seen and examined today.  Appears comfortable, requesting eye drops for dry eyes, ordered.   Objective:  Vitals:   10/12/23 1435 10/12/23 1440 10/12/23 1445 10/12/23 1450  BP: (!) 116/48 (!) 116/56 (!) 111/57 109/64  Pulse:      Resp: 15 20 16 19   Temp:      TempSrc:      SpO2: 98% 98% 100% 100%  Weight:      Height:        Intake/Output Summary (Last 24 hours) at 10/12/2023 1606 Last data filed at 10/12/2023 1306 Gross per 24 hour  Intake 481.93 ml  Output 750 ml  Net -268.07 ml   Filed Weights   10/10/23 0500 10/11/23 0500 10/12/23 0425  Weight: 57 kg 64.4 kg 62 kg     Exam General exam: Appears calm and comfortable  Respiratory system: Clear to auscultation. Respiratory effort normal. Cardiovascular system: S1 & S2 heard, RRR. No JVD,  Gastrointestinal system: Abdomen is soft mildly distended , no tenderness.  Central nervous system: Alert and oriented.  Extremities: no edema Skin: no rashes.     Data Reviewed:  I have personally reviewed following labs and imaging studies   CBC Lab Results  Component Value Date   WBC 8.6 10/12/2023   RBC 3.97 10/12/2023   HGB 12.3 10/12/2023   HCT 38.4 10/12/2023   MCV 96.7 10/12/2023   MCH 31.0 10/12/2023   PLT 348 10/12/2023   MCHC 32.0 10/12/2023   RDW 15.2 10/12/2023   LYMPHSABS 0.7 10/07/2023   MONOABS 1.0 10/07/2023   EOSABS 0.2 10/07/2023   BASOSABS 0.0 10/07/2023     Last metabolic panel Lab Results   Component Value Date   NA 139 10/12/2023   K 3.3 (L) 10/12/2023   CL 102 10/12/2023   CO2 26 10/12/2023   BUN 18 10/12/2023   CREATININE 0.64 10/12/2023   GLUCOSE 102 (H) 10/12/2023   GFRNONAA >60 10/12/2023   GFRAA >60 04/10/2016   CALCIUM 8.7 (L) 10/12/2023   PHOS 2.9 10/12/2023   PROT 6.1 (L) 10/12/2023   ALBUMIN 2.5 (L) 10/12/2023   BILITOT 1.2 10/12/2023   ALKPHOS 78 10/12/2023   AST 23 10/12/2023   ALT 23 10/12/2023   ANIONGAP 11 10/12/2023    CBG (last 3)  Recent Labs    10/12/23 0750 10/12/23 1238 10/12/23 1531  GLUCAP 114* 112* 88      Coagulation Profile: Recent Labs  Lab 10/11/23 1255 10/12/23 0655  INR 1.1 1.1     Radiology Studies: [place] EKG SITE RITE  Result Date: 10/11/2023 If Site Rite image not attached, placement could not be confirmed due to current cardiac rhythm.  CT ABDOMEN PELVIS W CONTRAST  Result Date: 10/11/2023 CLINICAL DATA:  Postoperative abdominal pain. EXAM: CT ABDOMEN AND PELVIS WITH CONTRAST TECHNIQUE: Multidetector CT imaging of the abdomen and pelvis was performed using the standard protocol following bolus administration of intravenous contrast. RADIATION DOSE REDUCTION: This exam was performed according to the departmental dose-optimization program which includes automated exposure control, adjustment of the mA and/or kV according to patient size and/or use of iterative reconstruction technique. CONTRAST:  OMNIPAQUE IOHEXOL 300 MG/ML  SOLN COMPARISON:  October 04, 2023. FINDINGS: Lower chest: Mild bilateral posterior basilar subsegmental atelectasis is noted. Hepatobiliary: No focal liver abnormality is seen. No gallstones, gallbladder wall thickening, or biliary dilatation. Pancreas: Unremarkable. No pancreatic ductal dilatation or surrounding inflammatory changes. Spleen: Normal in size without focal abnormality. Adrenals/Urinary Tract: Adrenal glands appear normal. Stable right renal cyst for which no further follow-up is  required. Small left renal calculus. No hydronephrosis or  renal obstruction is noted. Urinary bladder is unremarkable. Stomach/Bowel: Nasogastric tube tip is seen in stomach. Colostomy is noted in left lower quadrant with Hartmann's pouch. Status post partial small bowel obstruction and partial left colectomy. No significant bowel dilatation is noted. Small bowel surgical anastomosis is noted in the pelvis. There appears to be extravasation of contrast in this area suggesting possible bowel perforation. This is best seen on image number 60 of series 7. Contrast appears to be extending into posterior pelvic complex fluid collection which measures 7 x 6 cm. There is high density material within this suggesting clot. Vascular/Lymphatic: Aortic atherosclerosis. No enlarged abdominal or pelvic lymph nodes. IVC filter is noted in infrarenal position. Reproductive: Status post hysterectomy. No adnexal masses. Other: Midline surgical incision is noted anteriorly in the pelvis. No definite hernia is noted. Musculoskeletal: No acute or significant osseous findings. IMPRESSION: Extensive postsurgical changes are noted, with colostomy seen in left lower quadrant as well as Hartmann's pouch. Status post partial left colectomy. Small bowel anastomotic site is noted in the pelvis, and there does appear to be extravasation of contrast in this area suggesting perforation or leakage. Contrast is seen extending into 7 x 6 cm complex fluid collection posteriorly in the pelvis which appears to contain some degree of clot as well. These results will be called to the ordering clinician or representative by the Radiologist Assistant, and communication documented in the PACS or zVision Dashboard. Aortic Atherosclerosis (ICD10-I70.0). Electronically Signed   By: Lupita Raider M.D.   On: 10/11/2023 11:44       Kathlen Mody M.D. [facility] 10/12/2023, 4:06 PM  Available via Epic secure chat 7am-7pm After 7 pm, please refer to  night coverage provider listed on amion.

## 2023-10-12 NOTE — Plan of Care (Signed)

## 2023-10-12 NOTE — Progress Notes (Signed)
Progress Note  11 Days Post-Op  Subjective: Pt reports feeling cold this AM, more stool output. Plans for IR drain today.   Objective: Vital signs in last 24 hours: Temp:  [97.8 F (36.6 C)-98.8 F (37.1 C)] 97.8 F (36.6 C) (11/01 0834) Pulse Rate:  [53-83] 63 (11/01 0800) Resp:  [11-20] 19 (11/01 0800) BP: (108-139)/(44-69) 128/68 (11/01 0800) SpO2:  [88 %-96 %] 95 % (11/01 0800) Weight:  [62 kg] 62 kg (11/01 0425) Last BM Date : 10/10/23  Intake/Output from previous day: 10/31 0701 - 11/01 0700 In: 192.9 [IV Piggyback:192.9] Out: 750 [Urine:150; Emesis/NG output:600] Intake/Output this shift: No intake/output data recorded.  PE: General: pleasant, WD, thin female who is laying in bed in NAD Lungs: Respiratory effort nonlabored Abd: soft, appropriately ttp, midline wound clean, stoma viable with stool in ostomy bag   Lab Results:  Recent Labs    10/11/23 1255 10/12/23 0655  WBC 9.8 8.6  HGB 12.7 12.3  HCT 39.2 38.4  PLT 355 348   BMET Recent Labs    10/11/23 0952 10/12/23 0655  NA 136 139  K 3.2* 3.3*  CL 99 102  CO2 26 26  GLUCOSE 122* 102*  BUN 18 18  CREATININE 0.47 0.64  CALCIUM 8.5* 8.7*   PT/INR Recent Labs    10/11/23 1255 10/12/23 0655  LABPROT 14.3 14.8  INR 1.1 1.1   CMP     Component Value Date/Time   NA 139 10/12/2023 0655   K 3.3 (L) 10/12/2023 0655   CL 102 10/12/2023 0655   CO2 26 10/12/2023 0655   GLUCOSE 102 (H) 10/12/2023 0655   BUN 18 10/12/2023 0655   CREATININE 0.64 10/12/2023 0655   CALCIUM 8.7 (L) 10/12/2023 0655   PROT 6.1 (L) 10/12/2023 0655   ALBUMIN 2.5 (L) 10/12/2023 0655   AST 23 10/12/2023 0655   ALT 23 10/12/2023 0655   ALKPHOS 78 10/12/2023 0655   BILITOT 1.2 10/12/2023 0655   GFRNONAA >60 10/12/2023 0655   GFRAA >60 04/10/2016 1352   Lipase     Component Value Date/Time   LIPASE 41 09/27/2023 2007       Studies/Results: [place] EKG SITE RITE  Result Date: 10/11/2023 If Site Rite image  not attached, placement could not be confirmed due to current cardiac rhythm.  CT ABDOMEN PELVIS W CONTRAST  Result Date: 10/11/2023 CLINICAL DATA:  Postoperative abdominal pain. EXAM: CT ABDOMEN AND PELVIS WITH CONTRAST TECHNIQUE: Multidetector CT imaging of the abdomen and pelvis was performed using the standard protocol following bolus administration of intravenous contrast. RADIATION DOSE REDUCTION: This exam was performed according to the departmental dose-optimization program which includes automated exposure control, adjustment of the mA and/or kV according to patient size and/or use of iterative reconstruction technique. CONTRAST:  OMNIPAQUE IOHEXOL 300 MG/ML  SOLN COMPARISON:  October 04, 2023. FINDINGS: Lower chest: Mild bilateral posterior basilar subsegmental atelectasis is noted. Hepatobiliary: No focal liver abnormality is seen. No gallstones, gallbladder wall thickening, or biliary dilatation. Pancreas: Unremarkable. No pancreatic ductal dilatation or surrounding inflammatory changes. Spleen: Normal in size without focal abnormality. Adrenals/Urinary Tract: Adrenal glands appear normal. Stable right renal cyst for which no further follow-up is required. Small left renal calculus. No hydronephrosis or renal obstruction is noted. Urinary bladder is unremarkable. Stomach/Bowel: Nasogastric tube tip is seen in stomach. Colostomy is noted in left lower quadrant with Hartmann's pouch. Status post partial small bowel obstruction and partial left colectomy. No significant bowel dilatation is noted. Small  bowel surgical anastomosis is noted in the pelvis. There appears to be extravasation of contrast in this area suggesting possible bowel perforation. This is best seen on image number 60 of series 7. Contrast appears to be extending into posterior pelvic complex fluid collection which measures 7 x 6 cm. There is high density material within this suggesting clot. Vascular/Lymphatic: Aortic  atherosclerosis. No enlarged abdominal or pelvic lymph nodes. IVC filter is noted in infrarenal position. Reproductive: Status post hysterectomy. No adnexal masses. Other: Midline surgical incision is noted anteriorly in the pelvis. No definite hernia is noted. Musculoskeletal: No acute or significant osseous findings. IMPRESSION: Extensive postsurgical changes are noted, with colostomy seen in left lower quadrant as well as Hartmann's pouch. Status post partial left colectomy. Small bowel anastomotic site is noted in the pelvis, and there does appear to be extravasation of contrast in this area suggesting perforation or leakage. Contrast is seen extending into 7 x 6 cm complex fluid collection posteriorly in the pelvis which appears to contain some degree of clot as well. These results will be called to the ordering clinician or representative by the Radiologist Assistant, and communication documented in the PACS or zVision Dashboard. Aortic Atherosclerosis (ICD10-I70.0). Electronically Signed   By: Lupita Raider M.D.   On: 10/11/2023 11:44    Anti-infectives: Anti-infectives (From admission, onward)    Start     Dose/Rate Route Frequency Ordered Stop   10/05/23 1200  piperacillin-tazobactam (ZOSYN) IVPB 3.375 g        3.375 g 12.5 mL/hr over 240 Minutes Intravenous Every 8 hours 10/05/23 0721 10/09/23 1716   10/04/23 2000  piperacillin-tazobactam (ZOSYN) IVPB 3.375 g  Status:  Discontinued        3.375 g 12.5 mL/hr over 240 Minutes Intravenous Every 8 hours 10/04/23 1827 10/05/23 0721   09/27/23 2315  piperacillin-tazobactam (ZOSYN) IVPB 3.375 g  Status:  Discontinued        3.375 g 12.5 mL/hr over 240 Minutes Intravenous Every 8 hours 09/27/23 2305 10/02/23 1106        Assessment/Plan  POD11 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 [name] - CT 10/24 with hematoma in the pelvis adjacent to small bowel anastomosis and ileus vs obstruction  - CT  yesterday ended up showing anastomotic leak - plan for IR drain placement today, she is having at least some stuff get by leak given stool output  - PICC and TPN  - midline wound clean  - no leukocytosis, completed 5 d zosyn - afebrile, if able to drain collection and control with drain then does not necessarily need abx from our standpoint   FEN: NGT to LIWS, PICC/TPN VTE: IVC Filter  ID: no current abx  - per TR[facility] -  ABL anemia secondary to post-op hematomas and hematuria  Acute PE and LLE DVT Pancreatic tail cyst Thyroid nodule  Chronic HFpEF Paroxysmal A. Fib Pre-diabetes RLS  LOS: 15 days    Juliet Rude, [facility] 10/12/2023, 9:01 AM Please see Amion for pager number during day hours 7:00am-4:30pm

## 2023-10-12 NOTE — Progress Notes (Signed)
Peripherally Inserted Central Catheter Placement  The IV Nurse has discussed with the patient and/or persons authorized to consent for the patient, the purpose of this procedure and the potential benefits and risks involved with this procedure.  The benefits include less needle sticks, lab draws from the catheter, and the patient may be discharged home with the catheter. Risks include, but not limited to, infection, bleeding, blood clot (thrombus formation), and puncture of an artery; nerve damage and irregular heartbeat and possibility to perform a PICC exchange if needed/ordered by physician.  Alternatives to this procedure were also discussed.  Bard Power PICC patient education guide, fact sheet on infection prevention and patient information card has been provided to patient /or left at bedside.    PICC Placement Documentation  PICC Double Lumen 10/12/23 Left Basilic 42 cm 0 cm (Active)  Indication for Insertion or Continuance of Line Administration of hyperosmolar/irritating solutions (i.e. TPN, Vancomycin, etc.) 10/12/23 1239  Exposed Catheter (cm) 0 cm 10/12/23 1239  Site Assessment Clean, Dry, Intact 10/12/23 1239  Lumen #1 Status Flushed;Saline locked;Blood return noted 10/12/23 1239  Lumen #2 Status Flushed;Saline locked;Blood return noted 10/12/23 1239  Dressing Type Transparent;Securing device 10/12/23 1239  Dressing Status Antimicrobial disc in place;Clean, Dry, Intact 10/12/23 1239  Line Care Connections checked and tightened 10/12/23 1239  Dressing Intervention New dressing 10/12/23 1239  Dressing Change Due 10/19/23 10/12/23 1239       Andrey Cota 10/12/2023, 12:40 PM

## 2023-10-12 NOTE — Progress Notes (Signed)
Initial Nutrition Assessment  INTERVENTION:   Monitor magnesium, potassium, and phosphorus for at least 3 days, MD to replete as needed, as pt is at risk for refeeding syndrome.  -TPN management per Pharmacy -Recommend 100 mg Thiamine daily x 5 days -Daily weights while on TPN  NUTRITION DIAGNOSIS:   Inadequate oral intake related to acute illness as evidenced by NPO status.  GOAL:   Patient will meet greater than or equal to 90% of their needs  MONITOR:   Diet advancement, Labs, Weight trends, I & O's (TPN)  REASON FOR ASSESSMENT:   Consult New TPN/TNA  ASSESSMENT:   82 year old F with PMH of A-fib, RLS and anemia presented to ED on 10/17 with recurrent abdominal pain, nausea, emesis, diarrhea and intolerance of oral intake.  Patient was seen in ED on 10/10 for abdominal pain, decreased appetite and fever and discharged on Augmentin for possible colitis noted on CT abdomen and pelvis.  10/18: admitted 10/19: s/p flex sigmoidoscopy 10/21: s/p colectomy w/ colostomy 10/23: NPO 10/31: CLD -> NPO  Patient currently working with therapy. Will attempt to gather history at a later time. Pt has been admitted for 14 days. Her diet has been unable to be advanced given post-op ileus. Will be at refeeding risk. Pt to start TPN tonight. Clinimix 8/10 E @ 41.7 mL/hr + lipids (provides 80 g of protein and 1159 kcals per day)   Admission weight: 124 lbs Current weight: 136 lbs  Medications: Dulcolax, KCl  Labs reviewed: CBGs: 103-147 Low K   NUTRITION - FOCUSED PHYSICAL EXAM:  Unable to complete  Diet Order:   Diet Order             Diet NPO time specified Except for: Ice Chips  Diet effective now                   EDUCATION NEEDS:   Not appropriate for education at this time  Skin:  Skin Assessment: Skin Integrity Issues: Skin Integrity Issues:: Stage II, Incisions Stage II: mid sacrum Incisions: 10/21 abdomen  Last BM:  10/31 -type 6&7  Height:   Ht  Readings from Last 1 Encounters:  10/04/23 5\' 5"  (1.651 m)    Weight:   Wt Readings from Last 1 Encounters:  10/12/23 62 kg    BMI:  Body mass index is 22.75 kg/m.  Estimated Nutritional Needs:   Kcal:  1900-2100  Protein:  90-105g  Fluid:  2L/day   Tilda Franco, MS, RD, LDN Inpatient Clinical Dietitian Contact information available via Amion

## 2023-10-13 DIAGNOSIS — D62 Acute posthemorrhagic anemia: Secondary | ICD-10-CM | POA: Diagnosis not present

## 2023-10-13 DIAGNOSIS — K56609 Unspecified intestinal obstruction, unspecified as to partial versus complete obstruction: Secondary | ICD-10-CM | POA: Diagnosis not present

## 2023-10-13 DIAGNOSIS — I824Y2 Acute embolism and thrombosis of unspecified deep veins of left proximal lower extremity: Secondary | ICD-10-CM | POA: Diagnosis not present

## 2023-10-13 LAB — CBC WITH DIFFERENTIAL/PLATELET
Abs Immature Granulocytes: 0.15 10*3/uL — ABNORMAL HIGH (ref 0.00–0.07)
Basophils Absolute: 0.1 10*3/uL (ref 0.0–0.1)
Basophils Relative: 0 %
Eosinophils Absolute: 0.1 10*3/uL (ref 0.0–0.5)
Eosinophils Relative: 1 %
HCT: 39.3 % (ref 36.0–46.0)
Hemoglobin: 12.6 g/dL (ref 12.0–15.0)
Immature Granulocytes: 1 %
Lymphocytes Relative: 7 %
Lymphs Abs: 0.8 10*3/uL (ref 0.7–4.0)
MCH: 31 pg (ref 26.0–34.0)
MCHC: 32.1 g/dL (ref 30.0–36.0)
MCV: 96.8 fL (ref 80.0–100.0)
Monocytes Absolute: 0.9 10*3/uL (ref 0.1–1.0)
Monocytes Relative: 7 %
Neutro Abs: 9.7 10*3/uL — ABNORMAL HIGH (ref 1.7–7.7)
Neutrophils Relative %: 84 %
Platelets: 351 10*3/uL (ref 150–400)
RBC: 4.06 MIL/uL (ref 3.87–5.11)
RDW: 15 % (ref 11.5–15.5)
WBC: 11.7 10*3/uL — ABNORMAL HIGH (ref 4.0–10.5)
nRBC: 0 % (ref 0.0–0.2)

## 2023-10-13 LAB — BASIC METABOLIC PANEL
Anion gap: 8 (ref 5–15)
BUN: 21 mg/dL (ref 8–23)
CO2: 28 mmol/L (ref 22–32)
Calcium: 8.5 mg/dL — ABNORMAL LOW (ref 8.9–10.3)
Chloride: 99 mmol/L (ref 98–111)
Creatinine, Ser: 0.7 mg/dL (ref 0.44–1.00)
GFR, Estimated: >60 mL/min (ref >60)
Glucose, Bld: 137 mg/dL — ABNORMAL HIGH (ref 70–99)
Potassium: 3.6 mmol/L (ref 3.5–5.1)
Sodium: 135 mmol/L (ref 135–145)

## 2023-10-13 LAB — MAGNESIUM: Magnesium: 2.1 mg/dL (ref 1.7–2.4)

## 2023-10-13 LAB — PHOSPHORUS: Phosphorus: 2.3 mg/dL — ABNORMAL LOW (ref 2.5–4.6)

## 2023-10-13 LAB — GLUCOSE, CAPILLARY
Glucose-Capillary: 144 mg/dL — ABNORMAL HIGH (ref 70–99)
Glucose-Capillary: 125 mg/dL — ABNORMAL HIGH (ref 70–99)
Glucose-Capillary: 108 mg/dL — ABNORMAL HIGH (ref 70–99)
Glucose-Capillary: 141 mg/dL — ABNORMAL HIGH (ref 70–99)
Glucose-Capillary: 143 mg/dL — ABNORMAL HIGH (ref 70–99)
Glucose-Capillary: 118 mg/dL — ABNORMAL HIGH (ref 70–99)

## 2023-10-13 MED ORDER — TRACE MINERALS CU-MN-SE-ZN 300-55-60-3000 MCG/ML IV SOLN
INTRAVENOUS | Status: AC
  Filled 2023-10-13 (×2): qty 1000

## 2023-10-13 MED ORDER — FAT EMUL FISH OIL/PLANT BASED 20% (SMOFLIPID)IV EMUL
250.0000 mL | INTRAVENOUS | Status: AC
  Administered 2023-10-13: 250 mL via INTRAVENOUS
  Filled 2023-10-13: qty 250

## 2023-10-13 MED ORDER — POTASSIUM PHOSPHATES 15 MMOLE/5ML IV SOLN
15.0000 mmol | Freq: Once | INTRAVENOUS | Status: AC
  Administered 2023-10-13: 15 mmol via INTRAVENOUS
  Filled 2023-10-13: qty 5

## 2023-10-13 MED ORDER — INSULIN ASPART 100 UNIT/ML IJ SOLN
0.0000 [IU] | Freq: Four times a day (QID) | INTRAMUSCULAR | Status: DC
  Administered 2023-10-13: 1 [IU] via SUBCUTANEOUS
  Administered 2023-10-14: 2 [IU] via SUBCUTANEOUS
  Administered 2023-10-14 – 2023-10-15 (×4): 1 [IU] via SUBCUTANEOUS

## 2023-10-13 NOTE — Progress Notes (Signed)
Progress Note  12 Days Post-Op  Subjective: Pt states she's feeling somewhat better  Objective: Vital signs in last 24 hours: Temp:  [97.8 F (36.6 C)-98.5 F (36.9 C)] 98.5 F (36.9 C) (11/02 0435) Pulse Rate:  [35-116] 116 (11/02 0500) Resp:  [10-21] 15 (11/02 0500) BP: (105-132)/(36-100) 105/36 (11/02 0500) SpO2:  [93 %-100 %] 93 % (11/02 0500) Weight:  [62 kg] 62 kg (11/02 0500) Last BM Date : 10/12/23  Intake/Output from previous day: 11/01 0701 - 11/02 0700 In: 1075.7 [I.V.:781.7; IV Piggyback:289.1] Out: 1150 [Urine:350; Emesis/NG output:600; Drains:175; Stool:25] Intake/Output this shift: No intake/output data recorded.  PE: General: pleasant, WD, thin female who is laying in bed in NAD Lungs: Respiratory effort nonlabored Abd: soft, appropriately ttp, midline wound clean, stoma viable with stool in ostomy bag   Lab Results:  Recent Labs    10/11/23 1255 10/12/23 0655  WBC 9.8 8.6  HGB 12.7 12.3  HCT 39.2 38.4  PLT 355 348   BMET Recent Labs    10/12/23 0655 10/13/23 0254  NA 139 135  K 3.3* 3.6  CL 102 99  CO2 26 28  GLUCOSE 102* 137*  BUN 18 21  CREATININE 0.64 0.70  CALCIUM 8.7* 8.5*   PT/INR Recent Labs    10/11/23 1255 10/12/23 0655  LABPROT 14.3 14.8  INR 1.1 1.1   CMP     Component Value Date/Time   NA 135 10/13/2023 0254   K 3.6 10/13/2023 0254   CL 99 10/13/2023 0254   CO2 28 10/13/2023 0254   GLUCOSE 137 (H) 10/13/2023 0254   BUN 21 10/13/2023 0254   CREATININE 0.70 10/13/2023 0254   CALCIUM 8.5 (L) 10/13/2023 0254   PROT 6.1 (L) 10/12/2023 0655   ALBUMIN 2.5 (L) 10/12/2023 0655   AST 23 10/12/2023 0655   ALT 23 10/12/2023 0655   ALKPHOS 78 10/12/2023 0655   BILITOT 1.2 10/12/2023 0655   GFRNONAA >60 10/13/2023 0254   GFRAA >60 04/10/2016 1352   Lipase     Component Value Date/Time   LIPASE 41 09/27/2023 2007       Studies/Results: CT GUIDED VISCERAL FLUID DRAIN BY PERC CATH  Result Date:  10/12/2023 INDICATION: [id number] Abscess [id number] EXAM: CT-GUIDED PELVIC ABSCESS DRAINAGE CATHETER PLACEMENT COMPARISON:  CT AP, 10/11/2023 MEDICATIONS: The patient is currently admitted to the hospital and receiving intravenous antibiotics. The antibiotics were administered within an appropriate time frame prior to the initiation of the procedure. ANESTHESIA/SEDATION: Moderate (conscious) sedation was employed during this procedure. A total of Versed 1.5 mg and Fentanyl 75 mcg was administered intravenously. Moderate Sedation Time: 25 minutes. The patient's level of consciousness and vital signs were monitored continuously by radiology nursing throughout the procedure under my direct supervision. CONTRAST:  None FLUOROSCOPY TIME:  CT dose; 948 mGycm COMPLICATIONS: None immediate. PROCEDURE: RADIATION DOSE REDUCTION: This exam was performed according to the departmental dose-optimization program which includes automated exposure control, adjustment of the mA and/or kV according to patient size and/or use of iterative reconstruction technique. Informed written consent was obtained from the patient and/or patient's representative after a discussion of the risks, benefits and alternatives to treatment. The patient was placed prone on the CT gantry and a pre procedural CT was performed re-demonstrating the known abscess/fluid collection within the pelvis. The procedure was planned. A timeout was performed prior to the initiation of the procedure. The RIGHT gluteus was prepped and draped in the usual sterile fashion. The overlying soft tissues were anesthetized with  1% lidocaine with epinephrine. Appropriate trajectory was planned with the use of a 22 gauge spinal needle. An 18 gauge trocar needle was advanced into the abscess/fluid collection and a short Amplatz super stiff wire was coiled within the collection. Appropriate positioning was confirmed with a limited CT scan. The tract was serially dilated allowing placement of a  12 Fr drainage catheter. Appropriate positioning was confirmed with a limited postprocedural CT scan. 10 mL of serosanguineous fluid was aspirated. The tube was connected to a drainage bag and sutured in place. A dressing was placed. The patient tolerated the procedure well without immediate post procedural complication. IMPRESSION: Successful CT guided placement of a 12 Fr percutaneous drain catheter into the pelvic abscess via a RIGHT transgluteal approach Aspiration of 10 mL of serous sanguinous fluid. Samples were sent to the laboratory as requested by the ordering clinical team. Roanna Banning, MD [facility] Radiology Electronically Signed   By: Roanna Banning M.D.   On: 10/12/2023 17:20   [place] EKG SITE RITE  Result Date: 10/11/2023 If Site Rite image not attached, placement could not be confirmed due to current cardiac rhythm.   Anti-infectives: Anti-infectives (From admission, onward)    Start     Dose/Rate Route Frequency Ordered Stop   10/05/23 1200  piperacillin-tazobactam (ZOSYN) IVPB 3.375 g        3.375 g 12.5 mL/hr over 240 Minutes Intravenous Every 8 hours 10/05/23 0721 10/09/23 1716   10/04/23 2000  piperacillin-tazobactam (ZOSYN) IVPB 3.375 g  Status:  Discontinued        3.375 g 12.5 mL/hr over 240 Minutes Intravenous Every 8 hours 10/04/23 1827 10/05/23 0721   09/27/23 2315  piperacillin-tazobactam (ZOSYN) IVPB 3.375 g  Status:  Discontinued        3.375 g 12.5 mL/hr over 240 Minutes Intravenous Every 8 hours 09/27/23 2305 10/02/23 1106        Assessment/Plan  POD12 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 [name] - CT 10/24 with hematoma in the pelvis adjacent to small bowel anastomosis and ileus vs obstruction  - IR 11/1: drain placement for small bowel anastomotic leak  - PICC and TPN, NG output still high  - midline wound clean  - no leukocytosis, completed 5 d zosyn - afebrile, does  not necessarily need abx from our standpoint   FEN: NGT to LIWS, PICC/TPN VTE: IVC Filter  ID: no current abx  - per [facility] -  ABL anemia secondary to post-op hematomas and hematuria  Acute PE and LLE DVT Pancreatic tail cyst Thyroid nodule  Chronic HFpEF Paroxysmal A. Fib Pre-diabetes RLS  LOS: 16 days    Vanita Panda, MD [facility] 10/13/2023, 8:15 AM Please see Amion for pager number during day hours 7:00am-4:30pm

## 2023-10-13 NOTE — Progress Notes (Signed)
[facility]                                                                               Elizabeth Odom, is a 82 y.o. female, DOB - 1941/10/31, ZOX:[id number] Admit date - 09/27/2023    Outpatient Primary MD for the patient is [name], FNP  LOS - 16  days    Brief summary   82 year old F with PMH of A-fib, RLS and anemia presented to ED on 10/17 with recurrent abdominal pain, nausea, emesis, diarrhea and intolerance of oral intake.  Patient was seen in ED on 10/10 for abdominal pain, decreased appetite and fever and discharged on Augmentin for possible colitis noted on CT abdomen and pelvis.  However, she has recurrence of his symptoms after interval improvement and presented back to the hospital, hospitalist called for admission, GI and general surgery called for consult.   CT initially showed proximal sigmoid colon wall thickening with upstream colonic dilation with air-fluid levels and large amount of stool compatible with obstruction.  There is also a 9 mm cystic lesion in the tail of pancreas, and subcentimeter lucent lesion in L1 vertebral body.  GI consulted, patient refused colonoscopy but agreed to sigmoidoscopy showing stricture versus mass at the rectosigmoid region -scope could not be advanced past this position.  Surgery consulted -status post ex lap with small bowel resection and Hartman's resection and colostomy on 10/01/2023.  Patient continues to have postop ileus, managed by general surgery.  NG remains in place to suction.   Hospital course complicated by acute PE and LLE DVT for which she was started on heparin -after which she unfortunately developed profound acute blood loss anemia with subsequent pelvic hematoma near the small bowel anastomosis as well as notable hematuria.  Urology consulted, signed off after resolution of hematuria. Given high risk of bleed with notable PE/DVT patient was evaluated by IR who placed IVC filter on 10/05/2023.  Patient  required total of 4 unit of blood transfusion, hemoglobin now stabilizing -no indication for further imaging or intervention at this time.   Patient continues to work with PT OT, patient will likely require placement given her prolonged hospitalization, weakness, and diminished ambulatory status from baseline.  Repeat CT abd and pelvis shows anastomotic leak into 7 x 6 cm complex fluid collection posteriorly in the pelvis . IR consulted and she underwent CT guided percutaneous drain placement into pelvic abscess.  Fluid sent for analysis and culture.    Assessment & Plan    Assessment and Plan:  Large Bowel Obstruction:  10/17 CT with proximal sigmoid colon thickening.  10/19 sigmoidoscopy with tight stricture - was found to have a mass biopsy negative for malignancy as suggesting inflammation.  10/21 s/p ex lap hartman's resection , small bowel resection and ostomy.  General surgery on board.  Repeat CT abd pelvis ordered showed a small leak at her anastomosis going into a 6 x 7 cm fluid collection . IR Consulted and she underwent  Drainage catheter placement for pelvic abscess.  Will hold off on antibiotics as she remains afebrile and wbc count wnl and she just completed a course of zosyn.  ID consulted as  fluid analysis shows gram positive cocci and gram negative robds.  Patient is also adamant about not having any more surgical interventions.  Pain controlled. NPO except for ice chips  Post  operative ileus vs partial SBO Exacerbated by intra abd bleeding.  CT abd and pelvis done and results reviewed.  She remains on NG tube with suction.    Acute blood loss anemia sec to pelvic/ peri anastomotic hematomas with Hematuria S/p 4 units of prbc transfusions.  S/p IVC filter,  Urology recommended no indication for cystoscopy or intervention.  Hemoglobin stable around 12.    Acute PE with right heart strain with acute left leg DVT Unable to tolerate anti coagulation , hence IVc  filter placed on 10/25    Nodule in the inf aspect of the isthmus Thyroid US - follow up outpatient.    PAF Rate controlled. Not on anti coagulation.    Hyponatremia  Resolved.    Hypokalemia:  Replaced.   Leukocytosis:  Resolved.   Hypophosphatemia:  Replaced.    Goals of care: Changed to DNR/DNI 10/24. -Palliative medicine on board   Body mass index is 23.15 kg/m.    Nutrition:  TPN started by Surgery.   RN Pressure Injury Documentation: Pressure Injury 10/08/23 Buttocks Right;Mid Stage 2 -  Partial thickness loss of dermis presenting as a shallow open injury with a red, pink wound bed without slough. (Active)  10/08/23 0800  Location: Buttocks  Location Orientation: Right;Mid  Staging: Stage 2 -  Partial thickness loss of dermis presenting as a shallow open injury with a red, pink wound bed without slough.  Wound Description (Comments):   Present on Admission:   Dressing Type None 10/13/23 0800   Local wound care.   Estimated body mass index is 22.75 kg/m as calculated from the following:   Height as of this encounter: 5\' 5"  (1.651 m).   Weight as of this encounter: 62 kg.  Code Status: DNR limited.  DVT Prophylaxis:  Place and maintain sequential compression device Start: 10/06/23 1319   Level of Care: Level of care: Stepdown Family Communication: None at bedside.   Disposition Plan:     Remains inpatient appropriate: remains NPO with suction.   Procedures:  CT abd and pelvis.  10/21 s/p ex lap hartman's resection , small bowel resection and ostomy.   Consultants:   General surgery.  IR Infectious disease.   Antimicrobials:   Anti-infectives (From admission, onward)    Start     Dose/Rate Route Frequency Ordered Stop   10/05/23 1200  piperacillin-tazobactam (ZOSYN) IVPB 3.375 g        3.375 g 12.5 mL/hr over 240 Minutes Intravenous Every 8 hours 10/05/23 0721 10/09/23 1716   10/04/23 2000  piperacillin-tazobactam (ZOSYN) IVPB 3.375 g   Status:  Discontinued        3.375 g 12.5 mL/hr over 240 Minutes Intravenous Every 8 hours 10/04/23 1827 10/05/23 0721   09/27/23 2315  piperacillin-tazobactam (ZOSYN) IVPB 3.375 g  Status:  Discontinued        3.375 g 12.5 mL/hr over 240 Minutes Intravenous Every 8 hours 09/27/23 2305 10/02/23 1106        Medications  Scheduled Meds:  bisacodyl  10 mg Rectal Once   bisacodyl  10 mg Rectal Daily   Chlorhexidine Gluconate Cloth  6 each Topical Daily   insulin aspart  0-9 Units Subcutaneous Q6H   ketotifen  2 drop Both Eyes BID   sodium chloride flush  10-40 mL Intracatheter  Q12H   sodium chloride flush  5 mL Intracatheter Q8H   Continuous Infusions:  TPN (CLINIMIX-E) Adult     And   fat emul(SMOFlipid)     potassium PHOSPHATE IVPB (in mmol) 15 mmol (10/13/23 0925)   TPN (CLINIMIX-E) Adult 41.7 mL/hr at 10/13/23 0615   PRN Meds:.acetaminophen **OR** acetaminophen, HYDROmorphone (DILAUDID) injection, liver oil-zinc oxide, LORazepam, ondansetron **OR** ondansetron (ZOFRAN) IV, phenol, polyvinyl alcohol, prochlorperazine, sodium chloride flush    Subjective:   Elizabeth Odom was seen and examined today.  No new complaints.   Objective:   Vitals:   10/13/23 0843 10/13/23 0900 10/13/23 1000 10/13/23 1100  BP:  (!) 116/43 (!) 126/47 (!) 106/46  Pulse:  72 (!) 115 74  Resp:  14 18 (!) 8  Temp: 98.3 F (36.8 C)     TempSrc: Oral     SpO2:  96% 98% 96%  Weight:      Height:        Intake/Output Summary (Last 24 hours) at 10/13/2023 1234 Last data filed at 10/13/2023 1000 Gross per 24 hour  Intake 1075.71 ml  Output 1250 ml  Net -174.29 ml   Filed Weights   10/11/23 0500 10/12/23 0425 10/13/23 0500  Weight: 64.4 kg 62 kg 62 kg     Exam General exam: Appears calm and comfortable  Respiratory system: Clear to auscultation. Respiratory effort normal. Cardiovascular system: S1 & S2 heard, RRR. No JVD,  Gastrointestinal system: Abdomen is soft, NT, colostomy in place,  pelvic drain in place.  Central nervous system: Alert and oriented. No focal neurological deficits. Extremities: Symmetric 5 x 5 power. Skin: No rashes,  Psychiatry: Mood & affect appropriate.      Data Reviewed:  I have personally reviewed following labs and imaging studies   CBC Lab Results  Component Value Date   WBC 8.6 10/12/2023   RBC 3.97 10/12/2023   HGB 12.3 10/12/2023   HCT 38.4 10/12/2023   MCV 96.7 10/12/2023   MCH 31.0 10/12/2023   PLT 348 10/12/2023   MCHC 32.0 10/12/2023   RDW 15.2 10/12/2023   LYMPHSABS 0.7 10/07/2023   MONOABS 1.0 10/07/2023   EOSABS 0.2 10/07/2023   BASOSABS 0.0 10/07/2023     Last metabolic panel Lab Results  Component Value Date   NA 135 10/13/2023   K 3.6 10/13/2023   CL 99 10/13/2023   CO2 28 10/13/2023   BUN 21 10/13/2023   CREATININE 0.70 10/13/2023   GLUCOSE 137 (H) 10/13/2023   GFRNONAA >60 10/13/2023   GFRAA >60 04/10/2016   CALCIUM 8.5 (L) 10/13/2023   PHOS 2.3 (L) 10/13/2023   PROT 6.1 (L) 10/12/2023   ALBUMIN 2.5 (L) 10/12/2023   BILITOT 1.2 10/12/2023   ALKPHOS 78 10/12/2023   AST 23 10/12/2023   ALT 23 10/12/2023   ANIONGAP 8 10/13/2023    CBG (last 3)  Recent Labs    10/13/23 0404 10/13/23 0755 10/13/23 1226  GLUCAP 144* 125* 108*      Coagulation Profile: Recent Labs  Lab 10/11/23 1255 10/12/23 0655  INR 1.1 1.1     Radiology Studies: CT GUIDED VISCERAL FLUID DRAIN BY PERC CATH  Result Date: 10/12/2023 INDICATION: [id number] Abscess [id number] EXAM: CT-GUIDED PELVIC ABSCESS DRAINAGE CATHETER PLACEMENT COMPARISON:  CT AP, 10/11/2023 MEDICATIONS: The patient is currently admitted to the hospital and receiving intravenous antibiotics. The antibiotics were administered within an appropriate time frame prior to the initiation of the procedure. ANESTHESIA/SEDATION: Moderate (conscious) sedation was employed during this  procedure. A total of Versed 1.5 mg and Fentanyl 75 mcg was administered intravenously.  Moderate Sedation Time: 25 minutes. The patient's level of consciousness and vital signs were monitored continuously by radiology nursing throughout the procedure under my direct supervision. CONTRAST:  None FLUOROSCOPY TIME:  CT dose; 948 mGycm COMPLICATIONS: None immediate. PROCEDURE: RADIATION DOSE REDUCTION: This exam was performed according to the departmental dose-optimization program which includes automated exposure control, adjustment of the mA and/or kV according to patient size and/or use of iterative reconstruction technique. Informed written consent was obtained from the patient and/or patient's representative after a discussion of the risks, benefits and alternatives to treatment. The patient was placed prone on the CT gantry and a pre procedural CT was performed re-demonstrating the known abscess/fluid collection within the pelvis. The procedure was planned. A timeout was performed prior to the initiation of the procedure. The RIGHT gluteus was prepped and draped in the usual sterile fashion. The overlying soft tissues were anesthetized with 1% lidocaine with epinephrine. Appropriate trajectory was planned with the use of a 22 gauge spinal needle. An 18 gauge trocar needle was advanced into the abscess/fluid collection and a short Amplatz super stiff wire was coiled within the collection. Appropriate positioning was confirmed with a limited CT scan. The tract was serially dilated allowing placement of a 12 Fr drainage catheter. Appropriate positioning was confirmed with a limited postprocedural CT scan. 10 mL of serosanguineous fluid was aspirated. The tube was connected to a drainage bag and sutured in place. A dressing was placed. The patient tolerated the procedure well without immediate post procedural complication. IMPRESSION: Successful CT guided placement of a 12 Fr percutaneous drain catheter into the pelvic abscess via a RIGHT transgluteal approach Aspiration of 10 mL of serous sanguinous  fluid. Samples were sent to the laboratory as requested by the ordering clinical team. Roanna Banning, MD [facility] Radiology Electronically Signed   By: Roanna Banning M.D.   On: 10/12/2023 17:20   [place] EKG SITE RITE  Result Date: 10/11/2023 If Site Rite image not attached, placement could not be confirmed due to current cardiac rhythm.      Kathlen Mody M.D. [facility] 10/13/2023, 12:34 PM  Available via Epic secure chat 7am-7pm After 7 pm, please refer to night coverage provider listed on amion.

## 2023-10-13 NOTE — Plan of Care (Signed)
  Problem: Cardiovascular: Goal: Ability to achieve and maintain adequate cardiovascular perfusion will improve Outcome: Progressing   Problem: Activity: Goal: Risk for activity intolerance will decrease Outcome: Not Progressing   Problem: Coping: Goal: Level of anxiety will decrease Outcome: Not Progressing   Problem: Pain Managment: Goal: General experience of comfort will improve Outcome: Not Progressing   Problem: Skin Integrity: Goal: Risk for impaired skin integrity will decrease Outcome: Not Progressing   Problem: Activity: Goal: Ability to return to baseline activity level will improve Outcome: Not Progressing   Problem: Health Behavior/Discharge Planning: Goal: Ability to safely manage health-related needs after discharge will improve Outcome: Not Progressing

## 2023-10-13 NOTE — Progress Notes (Signed)
Referring Physician(s): Carl Best, PA-C   Supervising Physician: Gilmer Mor  Patient Status:  [facility] - In-pt  Chief Complaint: S/p colostomy due to bowel obstruction 10/01/23, development of post anastomotic leak with pelvic fluid collection  S/p r TG drain placement by Dr. Milford Cage on 10/12/23   Subjective:  Pt laying in bed, AND, MD at bedside.  Reports abd pain but better then yesterday, working with team to have BM No N/v, fever   Allergies: Elemental sulfur  Medications: Prior to Admission medications   Medication Sig Start Date End Date Taking? Authorizing Provider  amoxicillin-clavulanate (AUGMENTIN) 875-125 MG tablet Take 1 tablet by mouth every 12 (twelve) hours. 09/21/23  Yes Horton, Kristie M, DO  ondansetron (ZOFRAN-ODT) 4 MG disintegrating tablet Take 4 mg by mouth every 8 (eight) hours as needed for vomiting or nausea. 09/16/23  Yes [provider]  meclizine (ANTIVERT) 25 MG tablet Take 25 mg by mouth 3 (three) times daily. Patient not taking: Reported on 11/23/2020    [provider]  Omega-3 Fatty Acids (FISH OIL) 500 MG CAPS Take 1 capsule by mouth daily. Patient not taking: Reported on 09/27/2023    [provider]     Vital Signs: BP (!) 106/46   Pulse 74   Temp 98.2 F (36.8 C) (Oral)   Resp (!) 8   Ht 5\' 5"  (1.651 m)   Wt 136 lb 11 oz (62 kg)   SpO2 96%   BMI 22.75 kg/m   Physical Exam Vitals reviewed.  Constitutional:      General: She is not in acute distress.    Appearance: She is ill-appearing.  HENT:     Head: Normocephalic and atraumatic.     Comments: + NGT, dark brown/red fluid coming out  Abdominal:     General: There is distension.  Skin:    General: Skin is warm and dry.     Coloration: Skin is not cyanotic or jaundiced.     Comments: Positive R TG drain to a suction bulb. Three way stopcock in place. ~20 ml of  dark red/brown colored fluid noted in the bulb. Drain aspirates and flushes well.     Neurological:     Mental Status: She is alert.  Psychiatric:        Mood and Affect: Mood normal.        Behavior: Behavior normal.     Imaging: CT GUIDED VISCERAL FLUID DRAIN BY PERC CATH  Result Date: 10/12/2023 INDICATION: [id number] Abscess [id number] EXAM: CT-GUIDED PELVIC ABSCESS DRAINAGE CATHETER PLACEMENT COMPARISON:  CT AP, 10/11/2023 MEDICATIONS: The patient is currently admitted to the hospital and receiving intravenous antibiotics. The antibiotics were administered within an appropriate time frame prior to the initiation of the procedure. ANESTHESIA/SEDATION: Moderate (conscious) sedation was employed during this procedure. A total of Versed 1.5 mg and Fentanyl 75 mcg was administered intravenously. Moderate Sedation Time: 25 minutes. The patient's level of consciousness and vital signs were monitored continuously by radiology nursing throughout the procedure under my direct supervision. CONTRAST:  None FLUOROSCOPY TIME:  CT dose; 948 mGycm COMPLICATIONS: None immediate. PROCEDURE: RADIATION DOSE REDUCTION: This exam was performed according to the departmental dose-optimization program which includes automated exposure control, adjustment of the mA and/or kV according to patient size and/or use of iterative reconstruction technique. Informed written consent was obtained from the patient and/or patient's representative after a discussion of the risks, benefits and alternatives to treatment. The patient was placed prone on the CT gantry and  a pre procedural CT was performed re-demonstrating the known abscess/fluid collection within the pelvis. The procedure was planned. A timeout was performed prior to the initiation of the procedure. The RIGHT gluteus was prepped and draped in the usual sterile fashion. The overlying soft tissues were anesthetized with 1% lidocaine with epinephrine. Appropriate trajectory was planned with the use of a 22 gauge spinal needle. An 18 gauge trocar needle was advanced  into the abscess/fluid collection and a short Amplatz super stiff wire was coiled within the collection. Appropriate positioning was confirmed with a limited CT scan. The tract was serially dilated allowing placement of a 12 Fr drainage catheter. Appropriate positioning was confirmed with a limited postprocedural CT scan. 10 mL of serosanguineous fluid was aspirated. The tube was connected to a drainage bag and sutured in place. A dressing was placed. The patient tolerated the procedure well without immediate post procedural complication. IMPRESSION: Successful CT guided placement of a 12 Fr percutaneous drain catheter into the pelvic abscess via a RIGHT transgluteal approach Aspiration of 10 mL of serous sanguinous fluid. Samples were sent to the laboratory as requested by the ordering clinical team. Roanna Banning, MD [facility] Radiology Electronically Signed   By: Roanna Banning M.D.   On: 10/12/2023 17:20   [place] EKG SITE RITE  Result Date: 10/11/2023 If Site Rite image not attached, placement could not be confirmed due to current cardiac rhythm.  CT ABDOMEN PELVIS W CONTRAST  Result Date: 10/11/2023 CLINICAL DATA:  Postoperative abdominal pain. EXAM: CT ABDOMEN AND PELVIS WITH CONTRAST TECHNIQUE: Multidetector CT imaging of the abdomen and pelvis was performed using the standard protocol following bolus administration of intravenous contrast. RADIATION DOSE REDUCTION: This exam was performed according to the departmental dose-optimization program which includes automated exposure control, adjustment of the mA and/or kV according to patient size and/or use of iterative reconstruction technique. CONTRAST:  OMNIPAQUE IOHEXOL 300 MG/ML  SOLN COMPARISON:  October 04, 2023. FINDINGS: Lower chest: Mild bilateral posterior basilar subsegmental atelectasis is noted. Hepatobiliary: No focal liver abnormality is seen. No gallstones, gallbladder wall thickening, or  biliary dilatation. Pancreas: Unremarkable. No pancreatic ductal dilatation or surrounding inflammatory changes. Spleen: Normal in size without focal abnormality. Adrenals/Urinary Tract: Adrenal glands appear normal. Stable right renal cyst for which no further follow-up is required. Small left renal calculus. No hydronephrosis or renal obstruction is noted. Urinary bladder is unremarkable. Stomach/Bowel: Nasogastric tube tip is seen in stomach. Colostomy is noted in left lower quadrant with Hartmann's pouch. Status post partial small bowel obstruction and partial left colectomy. No significant bowel dilatation is noted. Small bowel surgical anastomosis is noted in the pelvis. There appears to be extravasation of contrast in this area suggesting possible bowel perforation. This is best seen on image number 60 of series 7. Contrast appears to be extending into posterior pelvic complex fluid collection which measures 7 x 6 cm. There is high density material within this suggesting clot. Vascular/Lymphatic: Aortic atherosclerosis. No enlarged abdominal or pelvic lymph nodes. IVC filter is noted in infrarenal position. Reproductive: Status post hysterectomy. No adnexal masses. Other: Midline surgical incision is noted anteriorly in the pelvis. No definite hernia is noted. Musculoskeletal: No acute or significant osseous findings. IMPRESSION: Extensive postsurgical changes are noted, with colostomy seen in left lower quadrant as well as Hartmann's pouch. Status post partial left colectomy. Small bowel anastomotic site is noted in the pelvis, and there does appear to be extravasation of contrast in this area suggesting  perforation or leakage. Contrast is seen extending into 7 x 6 cm complex fluid collection posteriorly in the pelvis which appears to contain some degree of clot as well. These results will be called to the ordering clinician or representative by the Radiologist Assistant, and communication documented in the  PACS or zVision Dashboard. Aortic Atherosclerosis (ICD10-I70.0). Electronically Signed   By: Lupita Raider M.D.   On: 10/11/2023 11:44    Labs:  CBC: Recent Labs    10/08/23 0318 10/10/23 0312 10/11/23 1255 10/12/23 0655  WBC 12.6* 9.3 9.8 8.6  HGB 11.1* 12.5 12.7 12.3  HCT 34.3* 39.1 39.2 38.4  PLT 293 347 355 348    COAGS: Recent Labs    09/20/23 1902 10/05/23 0247 10/11/23 1255 10/12/23 0655  INR 1.1 1.1 1.1 1.1  APTT  --  30  --   --     BMP: Recent Labs    10/10/23 0312 10/11/23 0952 10/12/23 0655 10/13/23 0254  NA 141 136 139 135  K 3.1* 3.2* 3.3* 3.6  CL 100 99 102 99  CO2 28 26 26 28   GLUCOSE 108* 122* 102* 137*  BUN 27* 18 18 21   CALCIUM 8.6* 8.5* 8.7* 8.5*  CREATININE 0.65 0.47 0.64 0.70  GFRNONAA >60 >60 >60 >60    LIVER FUNCTION TESTS: Recent Labs    09/27/23 2007 10/07/23 0313 10/08/23 0318 10/12/23 0655  BILITOT 0.6 1.4* 1.6* 1.2  AST 23 22 23 23   ALT 28 23 25 23   ALKPHOS 91 90 107 78  PROT 7.8 5.7* 5.8* 6.1*  ALBUMIN 3.7 2.3* 2.4* 2.5*    Assessment and Plan:  82 y.o. female with s/p colostomy due to bowel obstruction 10/01/23, development of post anastomotic leak with pelvic fluid collection  S/p R TG drain placement by Dr. Milford Cage on 10/12/23    Afebrile, tachycardic at times  No CBC today  Cx GPC/GNR, pending  Output 175 mL overnight, appears dark red/brown today   Drain Location: RTG Size: Fr size: 12 Fr Date of placement: 11/1  Currently to: Drain collection device: suction bulb 24 hour output:  Output by Drain (mL) 10/11/23 0701 - 10/11/23 1900 10/11/23 1901 - 10/12/23 0700 10/12/23 0701 - 10/12/23 1900 10/12/23 1901 - 10/13/23 0700 10/13/23 0701 - 10/13/23 1323  Closed System Drain 1 Right Buttock   50 125     Interval imaging/drain manipulation:  None   Current examination: Flushes/aspirates easily.  Insertion site unremarkable. Suture and stat lock in place. Dressed appropriately.   Plan: Continue TID  flushes with 5 cc NS. Record output Q shift. Dressing changes QD or PRN if soiled.  Call IR APP or on call IR MD if difficulty flushing or sudden change in drain output.  Repeat imaging/possible drain injection once output < 10 mL/QD (excluding flush material). Consideration for drain removal if output is < 10 mL/QD (excluding flush material), pending discussion with the providing surgical service.  Discharge planning: Please contact IR APP or on call IR MD prior to patient d/c to ensure appropriate follow up plans are in place. Typically patient will follow up with IR clinic 10-14 days post d/c for repeat imaging/possible drain injection. IR scheduler will contact patient with date/time of appointment. Patient will need to flush drain QD with 5 cc NS, record output QD, dressing changes every 2-3 days or earlier if soiled.   IR will continue to follow - please call with questions or concerns.   Electronically Signed: Willette Brace, PA-C  10/13/2023, 1:19 PM   I spent a total of 15 Minutes at the the patient's bedside AND on the patient's hospital floor or unit, greater than 50% of which was counseling/coordinating care for R TG drain f/u.  This chart was dictated using voice recognition software.  Despite best efforts to proofread,  errors can occur which can change the documentation meaning.

## 2023-10-13 NOTE — Consult Note (Signed)
[facility]    Date of Admission:  09/27/2023   Total days of inpatient antibiotics 13        Reason for Consult: Intra-abdominal infection  Principal Problem:   Large bowel obstruction (HCC) Active Problems:   Acute colitis   Left leg DVT (HCC)   ABLA (acute blood loss anemia)   Palliative care encounter   Counseling and coordination of care   Need for emotional support   Goals of care, counseling/discussion   Assessment: 82 year old female with history of A-fib, RLS, fall risk initially presented to ED with abdominal pain CT found [place] colitis discharged on p.o. Augmentin and increased dose of MiraLAX.  Readmitted with abdominal pain found to have: #Diverticular stricture status post Hartman's resection colostomy complicated by anastomosis leak of pelvic abscess requiring drain placement  - Initial CT on 1017 showed wall thickening of proximal sigmoid colon worrisome for neoplasm, focal colitis not excluded.  Upstream colonic dilatation with air-fluid levels with large amount of stool compatible with obstruction.  9 mm cystic lesion tail of pancreas.  MRI confirms cystic lesion, recommended MRI in 1 year for follow-up. - GI engaged patient underwent flex sig with poor prep on 10/19, noted congested erythematous thickened folds of the mucosa of the rectosigmoid biopsy.  Path was negative for malignancy - On 10/21 taken to the OR with general surgery for management of diverticular stricture requiring Hartman's resection colostomy.  Path did not show malignancy there [facility] course was complicated by PE requiring IVC.  Hematuria for which she was seen by urology. - Repeat CT on 10/31 showed concern for anastomotic leak with fluid measuring  6X 7 cm.  IR engaged patient underwent drain placement into pelvic abscess with 10 mL serosanguineous fluid aspirated sent for cultures.  Cultures taken showing GPC and GNR, cultures  pending.  Recommendations:  -Continue pip-tazo.  Follow-up aspirate cultures.  When patient is able to tolerate p.o. would like to transition to regimen by mouth(Cipro plus Flagyl) Microbiology:   Antibiotics: Pip-tazo 10/17-10/21, 10/24-p  Cultures:  Other 11/1 GPC and GNR on gram stain, pending  HPI: Elizabeth Odom is a 82 y.o. female with past medical history of A-fib, RLS, fall risk with alone presented to [facility] long ED with complaint of stomach pain.  Temp of 103.  CT showed small areas of small bowel colitis, constipation discharged on p.o. Augmentin and increasing MiraLAX.  Then developed intractable nausea and vomiting.,  Interval history cefepime oral intake.  However the ED CT T showed wall thickening of proximal sigmoid, colon worrisome for neoplasm, colonic dilatation with air-fluid levels large amount of stool compatible with obstruction.  Started on pip-tazo. GI was engaged and patient underwent flex sig on 10/19 with poor colon prep but did show congested erythematous thickened folds of the mucosa of the rectosigmoid biopsy. Taken to the OR on 10/21 for sigmoid stricture and underwent colectomy with colostomy creation and Hartman procedure, small bowel resection.  Repeat CT showed small leak at her anastomosis and a 6X 7 cm fluid collection. Radiology engaged and patient underwent CT-guided placement of [place] pelvic abscess with cultures pending.  Hospital course complicated by PE requiring IVC.  Review of Systems: Review of Systems  All other systems reviewed and are negative.   Past Medical History:  Diagnosis Date   A-fib (HCC) 04/09/2016   Acute head injury 04/09/2016   Cervical cancer (HCC)    Cervical spine fracture, initial  encounter 04/09/2016   Essential hypertension    History of anemia    Multiple fractures of cervical spine (HCC) 04/09/2016   PAC (premature atrial contraction)    Restless leg    Syncope and collapse 04/09/2016    Social History   Tobacco  Use   Smoking status: Never   Smokeless tobacco: Never  Substance Use Topics   Alcohol use: Never    Alcohol/week: 0.0 standard drinks of alcohol   Drug use: Never    Family History  Problem Relation Age of Onset   Other Mother        thrombosis   Cancer Sister    Diabetes Sister    Stroke Daughter    Thyroid disease Daughter    Scheduled Meds:  bisacodyl  10 mg Rectal Once   bisacodyl  10 mg Rectal Daily   Chlorhexidine Gluconate Cloth  6 each Topical Daily   insulin aspart  0-9 Units Subcutaneous Q6H   ketotifen  2 drop Both Eyes BID   sodium chloride flush  10-40 mL Intracatheter Q12H   sodium chloride flush  5 mL Intracatheter Q8H   Continuous Infusions:  TPN (CLINIMIX-E) Adult     And   fat emul(SMOFlipid)     potassium PHOSPHATE IVPB (in mmol) 43 mL/hr at 10/13/23 1430   TPN (CLINIMIX-E) Adult 41.7 mL/hr at 10/13/23 1430   PRN Meds:.acetaminophen **OR** acetaminophen, HYDROmorphone (DILAUDID) injection, liver oil-zinc oxide, LORazepam, ondansetron **OR** ondansetron (ZOFRAN) IV, phenol, polyvinyl alcohol, prochlorperazine, sodium chloride flush Allergies  Allergen Reactions   Elemental Sulfur Anaphylaxis    OBJECTIVE: Blood pressure (!) 150/73, pulse 73, temperature 98.2 F (36.8 C), temperature source Oral, resp. rate (!) 22, height 5\' 5"  (1.651 m), weight 62 kg, SpO2 97%.  Physical Exam Constitutional:      Appearance: Normal appearance.  HENT:     Head: Normocephalic and atraumatic.     Right Ear: Tympanic membrane normal.     Left Ear: Tympanic membrane normal.     Nose: Nose normal.     Mouth/Throat:     Mouth: Mucous membranes are moist.  Eyes:     Extraocular Movements: Extraocular movements intact.     Conjunctiva/sclera: Conjunctivae normal.     Pupils: Pupils are equal, round, and reactive to light.  Cardiovascular:     Rate and Rhythm: Normal rate and regular rhythm.     Heart sounds: No murmur heard.    No friction rub. No gallop.   Pulmonary:     Effort: Pulmonary effort is normal.     Breath sounds: Normal breath sounds.  Abdominal:     Comments: Colostomy and drain  Musculoskeletal:        General: Normal range of motion.  Skin:    General: Skin is warm and dry.  Neurological:     General: No focal deficit present.     Mental Status: She is alert and oriented to person, place, and time.  Psychiatric:        Mood and Affect: Mood normal.     Lab Results Lab Results  Component Value Date   WBC 11.7 (H) 10/13/2023   HGB 12.6 10/13/2023   HCT 39.3 10/13/2023   MCV 96.8 10/13/2023   PLT 351 10/13/2023    Lab Results  Component Value Date   CREATININE 0.70 10/13/2023   BUN 21 10/13/2023   NA 135 10/13/2023   K 3.6 10/13/2023   CL 99 10/13/2023   CO2 28 10/13/2023  Lab Results  Component Value Date   ALT 23 10/12/2023   AST 23 10/12/2023   ALKPHOS 78 10/12/2023   BILITOT 1.2 10/12/2023       Danelle Earthly, MD [facility] 10/13/2023, 3:09 PM   I have personally spent 82 minutes involved in face-to-face and non-face-to-face activities for this patient on the day of the visit. Professional time spent includes the following activities: Preparing to see the patient (review of tests), Obtaining and/or reviewing separately obtained history (admission/discharge record), Performing a medically appropriate examination and/or evaluation , Ordering medications/tests/procedures, referring and communicating with other health care professionals, Documenting clinical information in the EMR, Independently interpreting results (not separately reported), Communicating results to the patient/family/caregiver, Counseling and educating the patient/family/caregiver and Care coordination (not separately reported).

## 2023-10-13 NOTE — Plan of Care (Signed)
  Problem: Nutritional: Goal: Maintenance of adequate nutrition will improve Outcome: Progressing   Problem: Clinical Measurements: Goal: Ability to maintain clinical measurements within normal limits will improve Outcome: Progressing Goal: Will remain free from infection Outcome: Progressing   Problem: Activity: Goal: Risk for activity intolerance will decrease Outcome: Progressing   Problem: Nutrition: Goal: Adequate nutrition will be maintained Outcome: Progressing   Problem: Skin Integrity: Goal: Risk for impaired skin integrity will decrease Outcome: Not Progressing   Problem: Tissue Perfusion: Goal: Adequacy of tissue perfusion will improve Outcome: Not Progressing

## 2023-10-13 NOTE — Progress Notes (Signed)
PHARMACY - TOTAL PARENTERAL NUTRITION CONSULT NOTE   Indication:  Prolonged ileus  Patient Measurements: Height: 5\' 5"  (165.1 cm) Weight: 62 kg (136 lb 11 oz) IBW/kg (Calculated) : 57 TPN AdjBW (KG): 62 Body mass index is 22.75 kg/m. Usual Weight: 64.5 on 10/04/2023  Assessment:  TPN per Pharmacy consult for this 82 yo female admitted on 09/27/2023 with recurrent abdominal pain, nausea, emesis, diarrhea and intolerance of oral intake. Surgery consulted -status post ex lap with small bowel resection and Hartman's resection and colostomy on 10/01/2023.  Patient continues to have postop ileus, managed by general surgery.  NG remains in place to suction. 11/31 imaging showed anastomic leak. 11/1 IR drain catheter placement into pelvic abscess.  Glucose / Insulin: No PMH of DM.  Goal CBGs < 150. 24 hr glucose range: 102-144 24 hr units of insulin aspart usage: 2 Electrolytes:  K low at 3.6 and phos low at 2.3, others WNL including CoCa at 9.7 Goal K >= 4, mag >=2, phos ~3 Renal: SCr <1, BUN WNL Hepatic: Alk phos WNL, AST/ALT WNL, Total Bilirubin WNL, Total protein low at 6.1, albumin low at 2.5 Intake / Output; MIVF:  No MIVF Drains: NG/OG vented/dual lumen: 600, colostomy: 25, drain: 175 Urine output: 350 I/O Net: -74.3 GI Imaging: 10/10 CT Abdomen pelvis w contrast - small bowel enteritis/colitis that was also involving the sigmoid colon. No bowel obstruction seen.  10/17 CT Abdomen pelvis w/ contrast - wall thickening of the proximal sigmoid colon worrisome for neoplasm. Focal colitis not excluded. There is upstream colonic dilatation with air-fluid levels and large amount of stool compatible with obstruction.  There is also a 2.9 mm cystic mass at the tail of the pancreas.  10/18 MR abdomen w wo contrast - 9 mm cystic lesion within pancreatic body/tail junction, colonic obstruction 10/31 CT Abdomen pelvis w contrast - colostomy and Hartmann's pouch seen.  Appears to be extravasation  of contrast suggesting perforation or leakage.  Using Clinimix 8/10 due to IVF shortage caused by [facility] per hospital policy. Will not be able to meet full nutrition goals due to limited stock and fluid restrictions. Will replace electrolytes and give lipids outside of Clinimix.   GI Surgeries / Procedures:  10/19 flexible sigmoidoscopy 10/21 colectomy with colostomy creation/Hartman procedure 11/1: CT guided visceral fluid drain by perc cath Central access: PICC line placed 11/1 TPN start date: 11/1  Nutritional Goals: Current TPN rate is 41.7 mL/hr + lipids (provides 80 g of protein and 1159 kcals per day)  RD Assessment:  Estimated Needs Total Energy Estimated Needs: 1900-2100 Total Protein Estimated Needs: 90-105g Total Fluid Estimated Needs: 2L/day  Current Nutrition:  NPO except for ice chips  Plan:  Now: Potassium phosphate 15 mmol IV x 1  At 1800: Continue Clinimix 8/10 TPN with electrolytes at 41.7 mL/hr Electrolytes in TPN: Na 35 mEq/L, K 30 mEq/L, Ca 4.5 mEq/L, Mg 5 mEq/L, and Phos 15 mmol/L. Cl:Ac 1:1 Add standard MVI and trace elements to TPN, no chromium due to shortage SMOF Lipid 20% over 12 hours   Change Sensitive SSI to q6h and adjust as needed  No MIVF Monitor TPN labs on Mon/Thurs, BMET with magnesium & phos tomorrow am  Lynden Ang, PharmD, BCPS 10/13/2023,8:52 AM

## 2023-10-14 DIAGNOSIS — D62 Acute posthemorrhagic anemia: Secondary | ICD-10-CM | POA: Diagnosis not present

## 2023-10-14 DIAGNOSIS — I824Y2 Acute embolism and thrombosis of unspecified deep veins of left proximal lower extremity: Secondary | ICD-10-CM | POA: Diagnosis not present

## 2023-10-14 DIAGNOSIS — K56609 Unspecified intestinal obstruction, unspecified as to partial versus complete obstruction: Secondary | ICD-10-CM | POA: Diagnosis not present

## 2023-10-14 LAB — CBC
HCT: 39.5 % (ref 36.0–46.0)
Hemoglobin: 12.3 g/dL (ref 12.0–15.0)
MCH: 30.4 pg (ref 26.0–34.0)
MCHC: 31.1 g/dL (ref 30.0–36.0)
MCV: 97.8 fL (ref 80.0–100.0)
Platelets: 346 10*3/uL (ref 150–400)
RBC: 4.04 MIL/uL (ref 3.87–5.11)
RDW: 15.1 % (ref 11.5–15.5)
WBC: 13.2 10*3/uL — ABNORMAL HIGH (ref 4.0–10.5)
nRBC: 0 % (ref 0.0–0.2)

## 2023-10-14 LAB — BASIC METABOLIC PANEL
Anion gap: 11 (ref 5–15)
BUN: 23 mg/dL (ref 8–23)
CO2: 28 mmol/L (ref 22–32)
Calcium: 9.1 mg/dL (ref 8.9–10.3)
Chloride: 101 mmol/L (ref 98–111)
Creatinine, Ser: 0.63 mg/dL (ref 0.44–1.00)
GFR, Estimated: >60 mL/min (ref >60)
Glucose, Bld: 118 mg/dL — ABNORMAL HIGH (ref 70–99)
Potassium: 4.1 mmol/L (ref 3.5–5.1)
Sodium: 140 mmol/L (ref 135–145)

## 2023-10-14 LAB — GLUCOSE, CAPILLARY
Glucose-Capillary: 114 mg/dL — ABNORMAL HIGH (ref 70–99)
Glucose-Capillary: 123 mg/dL — ABNORMAL HIGH (ref 70–99)
Glucose-Capillary: 138 mg/dL — ABNORMAL HIGH (ref 70–99)
Glucose-Capillary: 139 mg/dL — ABNORMAL HIGH (ref 70–99)

## 2023-10-14 LAB — PHOSPHORUS: Phosphorus: 2.8 mg/dL (ref 2.5–4.6)

## 2023-10-14 LAB — MAGNESIUM: Magnesium: 2.1 mg/dL (ref 1.7–2.4)

## 2023-10-14 MED ORDER — GERHARDT'S BUTT CREAM: TOPICAL_CREAM | CUTANEOUS | Status: DC | PRN

## 2023-10-14 MED ORDER — PIPERACILLIN-TAZOBACTAM 3.375 G IVPB 30 MIN
3.3750 g | INTRAVENOUS | Status: AC
  Administered 2023-10-14: 3.375 g via INTRAVENOUS
  Filled 2023-10-14: qty 50

## 2023-10-14 MED ORDER — TRACE MINERALS CU-MN-SE-ZN 300-55-60-3000 MCG/ML IV SOLN
INTRAVENOUS | Status: AC
  Filled 2023-10-14: qty 1000

## 2023-10-14 MED ORDER — PIPERACILLIN-TAZOBACTAM 3.375 G IVPB
3.3750 g | Freq: Three times a day (TID) | INTRAVENOUS | Status: DC
  Administered 2023-10-14 – 2023-10-22 (×21): 3.375 g via INTRAVENOUS
  Filled 2023-10-14 (×25): qty 50

## 2023-10-14 MED ORDER — VANCOMYCIN HCL IN DEXTROSE 1-5 GM/200ML-% IV SOLN: 1000.0000 mg | INTRAVENOUS | Status: DC

## 2023-10-14 MED ORDER — VANCOMYCIN HCL 1250 MG/250ML IV SOLN
1250.0000 mg | Freq: Once | INTRAVENOUS | Status: AC
  Administered 2023-10-14: 1250 mg via INTRAVENOUS
  Filled 2023-10-14: qty 250

## 2023-10-14 MED ORDER — DEXTROSE 10 % IV SOLN: INTRAVENOUS | Status: AC

## 2023-10-14 MED ORDER — FAT EMUL FISH OIL/PLANT BASED 20% (SMOFLIPID)IV EMUL
250.0000 mL | INTRAVENOUS | Status: AC
  Administered 2023-10-14: 250 mL via INTRAVENOUS
  Filled 2023-10-14: qty 250

## 2023-10-14 NOTE — Progress Notes (Signed)
[facility]                                                                               Elizabeth Odom, is a 82 y.o. female, DOB - 09-Nov-1941, NWG:[id number] Admit date - 09/27/2023    Outpatient Primary MD for the patient is Elizabeth Pilon, FNP  LOS - 17  days    Brief summary   82 year old F with PMH of A-fib, RLS and anemia presented to ED on 10/17 with recurrent abdominal pain, nausea, emesis, diarrhea and intolerance of oral intake.  Patient was seen in ED on 10/10 for abdominal pain, decreased appetite and fever and discharged on Augmentin for possible colitis noted on CT abdomen and pelvis.  However, she has recurrence of his symptoms after interval improvement and presented back to the hospital, hospitalist called for admission, GI and general surgery called for consult.   CT initially showed proximal sigmoid colon wall thickening with upstream colonic dilation with air-fluid levels and large amount of stool compatible with obstruction.  There is also a 9 mm cystic lesion in the tail of pancreas, and subcentimeter lucent lesion in L1 vertebral body.  GI consulted, patient refused colonoscopy but agreed to sigmoidoscopy showing stricture versus mass at the rectosigmoid region -scope could not be advanced past this position.  Surgery consulted -status post ex lap with small bowel resection and Hartman's resection and colostomy on 10/01/2023.  Patient continues to have postop ileus, managed by general surgery.  NG remains in place to suction.   Hospital course complicated by acute PE and LLE DVT for which she was started on heparin -after which she unfortunately developed profound acute blood loss anemia with subsequent pelvic hematoma near the small bowel anastomosis as well as notable hematuria.  Urology consulted, signed off after resolution of hematuria. Given high risk of bleed with notable PE/DVT patient was evaluated by IR who placed IVC filter on 10/05/2023.  Patient  required total of 4 unit of blood transfusion, hemoglobin now stabilizing -no indication for further imaging or intervention at this time.   Patient continues to work with PT OT, patient will likely require placement given her prolonged hospitalization, weakness, and diminished ambulatory status from baseline.  Repeat CT abd and pelvis shows anastomotic leak into 7 x 6 cm complex fluid collection posteriorly in the pelvis . IR consulted and she underwent CT guided percutaneous drain placement into pelvic abscess.  Fluid sent for analysis and culture. Cultures growing MRSA. Antibiotics changed to Vancomycin.  Discussed the plan with the grand daughter.     Assessment & Plan    Assessment and Plan:  Large Bowel Obstruction:  10/17 CT with proximal sigmoid colon thickening.  10/19 sigmoidoscopy with tight stricture - was found to have a mass biopsy negative for malignancy as suggesting inflammation.  10/21 s/p ex lap hartman's resection , small bowel resection and ostomy.  General surgery on board.  Repeat CT abd pelvis ordered showed a small leak at her anastomosis going into a 6 x 7 cm fluid collection . IR Consulted and she underwent  Drainage catheter placement for pelvic abscess.  Fluid culture report show MRSA, vancomycin added to the  regimen.  ID consulted  for antibiotics.  Patient is also adamant about not having any more surgical interventions.  Pain controlled. NPO except for ice chips  Post  operative ileus vs partial SBO Exacerbated by intra abd bleeding.  CT abd and pelvis done and results reviewed.  She remains on NG tube with suction.    Acute blood loss anemia sec to pelvic/ peri anastomotic hematomas with Hematuria S/p 4 units of prbc transfusions.  S/p IVC filter,  Urology recommended no indication for cystoscopy or intervention.  Hemoglobin stable around 12.    Acute PE with right heart strain with acute left leg DVT Unable to tolerate anti coagulation , hence  IVc filter placed on 10/25    Nodule in the inf aspect of the isthmus Thyroid US - follow up outpatient.    PAF Rate controlled. Not on anti coagulation.    Hyponatremia  Resolved.    Hypokalemia:  Replaced.   Leukocytosis:  Slowly worsening.   Hypophosphatemia:  Replaced. Repeat levels wnl.       Goals of care: Changed to DNR/DNI 10/24. -Palliative medicine on board   Body mass index is 23.15 kg/m.    Nutrition:  TPN started by Surgery.   RN Pressure Injury Documentation: Pressure Injury 10/08/23 Buttocks Right;Mid Stage 2 -  Partial thickness loss of dermis presenting as a shallow open injury with a red, pink wound bed without slough. (Active)  10/08/23 0800  Location: Buttocks  Location Orientation: Right;Mid  Staging: Stage 2 -  Partial thickness loss of dermis presenting as a shallow open injury with a red, pink wound bed without slough.  Wound Description (Comments):   Present on Admission:   Dressing Type None 10/14/23 0800   Local wound care.   Estimated body mass index is 23.52 kg/m as calculated from the following:   Height as of this encounter: 5\' 5"  (1.651 m).   Weight as of this encounter: 64.1 kg.  Code Status: DNR limited.  DVT Prophylaxis:  Place and maintain sequential compression device Start: 10/06/23 1319   Level of Care: Level of care: Stepdown Family Communication: None at bedside.   Disposition Plan:     Remains inpatient appropriate: remains NPO with suction.   Procedures:  CT abd and pelvis.  10/21 s/p ex lap hartman's resection , small bowel resection and ostomy.   Consultants:   General surgery.  IR Infectious disease.   Antimicrobials:   Anti-infectives (From admission, onward)    Start     Dose/Rate Route Frequency Ordered Stop   10/14/23 1400  piperacillin-tazobactam (ZOSYN) IVPB 3.375 g        3.375 g 12.5 mL/hr over 240 Minutes Intravenous Every 8 hours 10/14/23 0845     10/14/23 0900   piperacillin-tazobactam (ZOSYN) IVPB 3.375 g        3.375 g 100 mL/hr over 30 Minutes Intravenous NOW 10/14/23 0844 10/14/23 1002   10/05/23 1200  piperacillin-tazobactam (ZOSYN) IVPB 3.375 g        3.375 g 12.5 mL/hr over 240 Minutes Intravenous Every 8 hours 10/05/23 0721 10/09/23 1716   10/04/23 2000  piperacillin-tazobactam (ZOSYN) IVPB 3.375 g  Status:  Discontinued        3.375 g 12.5 mL/hr over 240 Minutes Intravenous Every 8 hours 10/04/23 1827 10/05/23 0721   09/27/23 2315  piperacillin-tazobactam (ZOSYN) IVPB 3.375 g  Status:  Discontinued        3.375 g 12.5 mL/hr over 240 Minutes Intravenous Every 8 hours 09/27/23  2305 10/02/23 1106        Medications  Scheduled Meds:  bisacodyl  10 mg Rectal Once   bisacodyl  10 mg Rectal Daily   Chlorhexidine Gluconate Cloth  6 each Topical Daily   insulin aspart  0-9 Units Subcutaneous Q6H   ketotifen  2 drop Both Eyes BID   sodium chloride flush  10-40 mL Intracatheter Q12H   sodium chloride flush  5 mL Intracatheter Q8H   Continuous Infusions:  TPN (CLINIMIX-E) Adult     And   fat emul(SMOFlipid)     And   dextrose     piperacillin-tazobactam (ZOSYN)  IV 12.5 mL/hr at 10/14/23 1531   TPN (CLINIMIX-E) Adult 41.7 mL/hr at 10/14/23 1531   PRN Meds:.acetaminophen **OR** acetaminophen, HYDROmorphone (DILAUDID) injection, liver oil-zinc oxide, LORazepam, ondansetron **OR** ondansetron (ZOFRAN) IV, phenol, polyvinyl alcohol, prochlorperazine, sodium chloride flush    Subjective:   Elizabeth Odom was seen and examined today.  Reports feeling better than yesterday.  Wants to eat something other than ice chips.   Objective:   Vitals:   10/14/23 1200 10/14/23 1300 10/14/23 1349 10/14/23 1400  BP: 113/69 (!) 128/46  (!) 111/52  Pulse: 70 71  (!) 115  Resp: 14 14  14   Temp:   (!) 97.3 F (36.3 C)   TempSrc:   Oral   SpO2: 98% 100%  97%  Weight:      Height:        Intake/Output Summary (Last 24 hours) at 10/14/2023  1558 Last data filed at 10/14/2023 1531 Gross per 24 hour  Intake 1391.25 ml  Output 1225 ml  Net 166.25 ml   Filed Weights   10/12/23 0425 10/13/23 0500 10/14/23 0725  Weight: 62 kg 62 kg 64.1 kg     Exam General exam: Appears calm and comfortable  Respiratory system: Clear to auscultation. Respiratory effort normal. Cardiovascular system: S1 & S2 heard, RRR. No JVD, Gastrointestinal system: Abdomen is soft, pelvic drain in place, colostomy in place. Non distended, bs+ Central nervous system: Alert and oriented. No focal neurological deficits. Extremities: Symmetric 5 x 5 power. Skin: No rashes,  Psychiatry:  Mood & affect appropriate.       Data Reviewed:  I have personally reviewed following labs and imaging studies   CBC Lab Results  Component Value Date   WBC 13.2 (H) 10/14/2023   RBC 4.04 10/14/2023   HGB 12.3 10/14/2023   HCT 39.5 10/14/2023   MCV 97.8 10/14/2023   MCH 30.4 10/14/2023   PLT 346 10/14/2023   MCHC 31.1 10/14/2023   RDW 15.1 10/14/2023   LYMPHSABS 0.8 10/13/2023   MONOABS 0.9 10/13/2023   EOSABS 0.1 10/13/2023   BASOSABS 0.1 10/13/2023     Last metabolic panel Lab Results  Component Value Date   NA 140 10/14/2023   K 4.1 10/14/2023   CL 101 10/14/2023   CO2 28 10/14/2023   BUN 23 10/14/2023   CREATININE 0.63 10/14/2023   GLUCOSE 118 (H) 10/14/2023   GFRNONAA >60 10/14/2023   GFRAA >60 04/10/2016   CALCIUM 9.1 10/14/2023   PHOS 2.8 10/14/2023   PROT 6.1 (L) 10/12/2023   ALBUMIN 2.5 (L) 10/12/2023   BILITOT 1.2 10/12/2023   ALKPHOS 78 10/12/2023   AST 23 10/12/2023   ALT 23 10/12/2023   ANIONGAP 11 10/14/2023    CBG (last 3)  Recent Labs    10/14/23 0041 10/14/23 0624 10/14/23 1251  GLUCAP 123* 139* 114*  Coagulation Profile: Recent Labs  Lab 10/11/23 1255 10/12/23 0655  INR 1.1 1.1     Radiology Studies: No results found.     Kathlen Mody M.D. [facility] 10/14/2023, 3:58 PM  Available via  Epic secure chat 7am-7pm After 7 pm, please refer to night coverage provider listed on amion.

## 2023-10-14 NOTE — Progress Notes (Signed)
Pharmacy Antibiotic Note  Elizabeth Odom is a 82 y.o. female who is known to pharmacy from current TPN consult.  She underwent drainage cath placement into the pelvic abscess on 10/12/23. Abscess cx collected on 10/12/23 now has MRSA. Pharmacy has been consulted  on 10/14/23 to dose vancomycin for infection.  Today, 10/14/2023: - afeb, wbc 13.2 - scr 0.63  Plan: - vancomycin 1250 mg IV x1, then 1000 mg IV q24h for est AUC 491 - zosyn 3.375 gm q8h (infuse over 4 hrs) per MD  _____________________________________________  Height: 5\' 5"  (165.1 cm) Weight: 64.1 kg (141 lb 5 oz) IBW/kg (Calculated) : 57  Temp (24hrs), Avg:98.1 F (36.7 C), Min:97.3 F (36.3 C), Max:98.6 F (37 C)  Recent Labs  Lab 10/10/23 0312 10/11/23 0952 10/11/23 1255 10/12/23 0655 10/13/23 0254 10/13/23 1326 10/14/23 0952  WBC 9.3  --  9.8 8.6  --  11.7* 13.2*  CREATININE 0.65 0.47  --  0.64 0.70  --  0.63    Estimated Creatinine Clearance: 47.9 mL/min (by C-G formula based on SCr of 0.63 mg/dL).    Allergies  Allergen Reactions   Elemental Sulfur Anaphylaxis      Thank you for allowing pharmacy to be a part of this patient's care.  Lucia Gaskins 10/14/2023 5:32 PM

## 2023-10-14 NOTE — Plan of Care (Signed)
  Problem: Health Behavior/Discharge Planning: Goal: Ability to manage health-related needs will improve Outcome: Progressing   Problem: Metabolic: Goal: Ability to maintain appropriate glucose levels will improve Outcome: Progressing   Problem: Clinical Measurements: Goal: Ability to maintain clinical measurements within normal limits will improve Outcome: Progressing Goal: Will remain free from infection Outcome: Progressing Goal: Diagnostic test results will improve Outcome: Progressing Goal: Cardiovascular complication will be avoided Outcome: Progressing   Problem: Elimination: Goal: Will not experience complications related to bowel motility Outcome: Progressing   Problem: Safety: Goal: Ability to remain free from injury will improve Outcome: Progressing   Problem: Coping: Goal: Ability to adjust to condition or change in health will improve Outcome: Not Progressing   Problem: Fluid Volume: Goal: Ability to maintain a balanced intake and output will improve Outcome: Not Progressing   Problem: Health Behavior/Discharge Planning: Goal: Ability to identify and utilize available resources and services will improve Outcome: Not Progressing   Problem: Nutritional: Goal: Maintenance of adequate nutrition will improve Outcome: Not Progressing   Problem: Skin Integrity: Goal: Risk for impaired skin integrity will decrease Outcome: Not Progressing   Problem: Tissue Perfusion: Goal: Adequacy of tissue perfusion will improve Outcome: Not Progressing   Problem: Activity: Goal: Risk for activity intolerance will decrease Outcome: Not Progressing   Problem: Nutrition: Goal: Adequate nutrition will be maintained Outcome: Not Progressing   Problem: Coping: Goal: Level of anxiety will decrease Outcome: Not Progressing   Problem: Elimination: Goal: Will not experience complications related to urinary retention Outcome: Not Progressing   Problem: Pain  Managment: Goal: General experience of comfort will improve Outcome: Not Progressing

## 2023-10-14 NOTE — Plan of Care (Signed)
  Problem: Fluid Volume: Goal: Ability to maintain a balanced intake and output will improve Outcome: Progressing   Problem: Metabolic: Goal: Ability to maintain appropriate glucose levels will improve Outcome: Progressing   Problem: Nutritional: Goal: Maintenance of adequate nutrition will improve Outcome: Progressing   Problem: Education: Goal: Knowledge of General Education information will improve Description: Including pain rating scale, medication(s)/side effects and non-pharmacologic comfort measures Outcome: Progressing   Problem: Clinical Measurements: Goal: Respiratory complications will improve Outcome: Progressing Goal: Cardiovascular complication will be avoided Outcome: Progressing   Problem: Coping: Goal: Ability to adjust to condition or change in health will improve Outcome: Not Progressing   Problem: Skin Integrity: Goal: Risk for impaired skin integrity will decrease Outcome: Not Progressing   Problem: Activity: Goal: Risk for activity intolerance will decrease Outcome: Not Progressing   Problem: Elimination: Goal: Will not experience complications related to urinary retention Outcome: Not Progressing   Problem: Pain Managment: Goal: General experience of comfort will improve Outcome: Not Progressing   Problem: Skin Integrity: Goal: Risk for impaired skin integrity will decrease Outcome: Not Progressing

## 2023-10-14 NOTE — Progress Notes (Signed)
Progress Note  13 Days Post-Op  Subjective: Pt states she had a good day yesterday  Objective: Vital signs in last 24 hours: Temp:  [97.9 F (36.6 C)-98.6 F (37 C)] 97.9 F (36.6 C) (11/03 0358) Pulse Rate:  [72-115] 78 (11/02 1800) Resp:  [8-22] 15 (11/02 1800) BP: (106-158)/(42-74) 145/69 (11/02 1800) SpO2:  [94 %-98 %] 97 % (11/02 1800) Weight:  [64.1 kg] 64.1 kg (11/03 0725) Last BM Date : 10/12/23  Intake/Output from previous day: 11/02 0701 - 11/03 0700 In: 1520.1 [I.V.:1231.7; IV Piggyback:258.4] Out: 1280 [Urine:690; Emesis/NG output:500; Drains:90] Intake/Output this shift: Total I/O In: 108.7 [I.V.:108.7] Out: -   PE: General: pleasant, WD, thin female who is laying in bed in NAD Lungs: Respiratory effort nonlabored Abd: soft, appropriately ttp, midline wound clean, stoma viable with stool in ostomy bag JP: dark red output  Lab Results:  Recent Labs    10/12/23 0655 10/13/23 1326  WBC 8.6 11.7*  HGB 12.3 12.6  HCT 38.4 39.3  PLT 348 351   BMET Recent Labs    10/12/23 0655 10/13/23 0254  NA 139 135  K 3.3* 3.6  CL 102 99  CO2 26 28  GLUCOSE 102* 137*  BUN 18 21  CREATININE 0.64 0.70  CALCIUM 8.7* 8.5*   PT/INR Recent Labs    10/11/23 1255 10/12/23 0655  LABPROT 14.3 14.8  INR 1.1 1.1   CMP     Component Value Date/Time   NA 135 10/13/2023 0254   K 3.6 10/13/2023 0254   CL 99 10/13/2023 0254   CO2 28 10/13/2023 0254   GLUCOSE 137 (H) 10/13/2023 0254   BUN 21 10/13/2023 0254   CREATININE 0.70 10/13/2023 0254   CALCIUM 8.5 (L) 10/13/2023 0254   PROT 6.1 (L) 10/12/2023 0655   ALBUMIN 2.5 (L) 10/12/2023 0655   AST 23 10/12/2023 0655   ALT 23 10/12/2023 0655   ALKPHOS 78 10/12/2023 0655   BILITOT 1.2 10/12/2023 0655   GFRNONAA >60 10/13/2023 0254   GFRAA >60 04/10/2016 1352   Lipase     Component Value Date/Time   LIPASE 41 09/27/2023 2007       Studies/Results: CT GUIDED VISCERAL FLUID DRAIN BY PERC  CATH  Result Date: 10/12/2023 INDICATION: [id number] Abscess [id number] EXAM: CT-GUIDED PELVIC ABSCESS DRAINAGE CATHETER PLACEMENT COMPARISON:  CT AP, 10/11/2023 MEDICATIONS: The patient is currently admitted to the hospital and receiving intravenous antibiotics. The antibiotics were administered within an appropriate time frame prior to the initiation of the procedure. ANESTHESIA/SEDATION: Moderate (conscious) sedation was employed during this procedure. A total of Versed 1.5 mg and Fentanyl 75 mcg was administered intravenously. Moderate Sedation Time: 25 minutes. The patient's level of consciousness and vital signs were monitored continuously by radiology nursing throughout the procedure under my direct supervision. CONTRAST:  None FLUOROSCOPY TIME:  CT dose; 948 mGycm COMPLICATIONS: None immediate. PROCEDURE: RADIATION DOSE REDUCTION: This exam was performed according to the departmental dose-optimization program which includes automated exposure control, adjustment of the mA and/or kV according to patient size and/or use of iterative reconstruction technique. Informed written consent was obtained from the patient and/or patient's representative after a discussion of the risks, benefits and alternatives to treatment. The patient was placed prone on the CT gantry and a pre procedural CT was performed re-demonstrating the known abscess/fluid collection within the pelvis. The procedure was planned. A timeout was performed prior to the initiation of the procedure. The RIGHT gluteus was prepped and draped in the usual sterile  fashion. The overlying soft tissues were anesthetized with 1% lidocaine with epinephrine. Appropriate trajectory was planned with the use of a 22 gauge spinal needle. An 18 gauge trocar needle was advanced into the abscess/fluid collection and a short Amplatz super stiff wire was coiled within the collection. Appropriate positioning was confirmed with a limited CT scan. The tract was serially dilated  allowing placement of a 12 Fr drainage catheter. Appropriate positioning was confirmed with a limited postprocedural CT scan. 10 mL of serosanguineous fluid was aspirated. The tube was connected to a drainage bag and sutured in place. A dressing was placed. The patient tolerated the procedure well without immediate post procedural complication. IMPRESSION: Successful CT guided placement of a 12 Fr percutaneous drain catheter into the pelvic abscess via a RIGHT transgluteal approach Aspiration of 10 mL of serous sanguinous fluid. Samples were sent to the laboratory as requested by the ordering clinical team. Roanna Banning, MD [facility] Radiology Electronically Signed   By: Roanna Banning M.D.   On: 10/12/2023 17:20    Anti-infectives: Anti-infectives (From admission, onward)    Start     Dose/Rate Route Frequency Ordered Stop   10/05/23 1200  piperacillin-tazobactam (ZOSYN) IVPB 3.375 g        3.375 g 12.5 mL/hr over 240 Minutes Intravenous Every 8 hours 10/05/23 0721 10/09/23 1716   10/04/23 2000  piperacillin-tazobactam (ZOSYN) IVPB 3.375 g  Status:  Discontinued        3.375 g 12.5 mL/hr over 240 Minutes Intravenous Every 8 hours 10/04/23 1827 10/05/23 0721   09/27/23 2315  piperacillin-tazobactam (ZOSYN) IVPB 3.375 g  Status:  Discontinued        3.375 g 12.5 mL/hr over 240 Minutes Intravenous Every 8 hours 09/27/23 2305 10/02/23 1106        Assessment/Plan  POD13 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 [name] - CT 10/24 with hematoma in the pelvis adjacent to small bowel anastomosis and ileus vs obstruction  - IR 11/1: drain placement for small bowel anastomotic leak  - PICC and TPN, NG output still high and no ileostomy output - Drain aspirate growing Staph aureus, abx per ID  FEN: NGT to LIWS, PICC/TPN VTE: IVC Filter  ID: no current abx  - per TRH -  ABL anemia secondary to post-op hematomas  and hematuria  Acute PE and LLE DVT Pancreatic tail cyst Thyroid nodule  Chronic HFpEF Paroxysmal A. Fib Pre-diabetes RLS  LOS: 17 days    Vanita Panda, MD [facility] 10/14/2023, 7:51 AM Please see Amion for pager number during day hours 7:00am-4:30pm

## 2023-10-14 NOTE — Progress Notes (Signed)
PHARMACY - TOTAL PARENTERAL NUTRITION CONSULT NOTE   Indication:  Prolonged ileus  Patient Measurements: Height: 5\' 5"  (165.1 cm) Weight: 64.1 kg (141 lb 5 oz) IBW/kg (Calculated) : 57 TPN AdjBW (KG): 62 Body mass index is 23.52 kg/m. Usual Weight: 64.5 on 10/04/2023  Assessment:  TPN per Pharmacy consult for this 82 yo female admitted on 09/27/2023 with recurrent abdominal pain, nausea, emesis, diarrhea and intolerance of oral intake. Surgery consulted -status post ex lap with small bowel resection and Hartman's resection and colostomy on 10/01/2023.  Patient continues to have postop ileus, managed by general surgery.  NG remains in place to suction. 11/31 imaging showed anastomic leak. 11/1 IR drain catheter placement into pelvic abscess.  Glucose / Insulin: No PMH of DM.  Goal CBGs < 150. 24 hr glucose range: 108-139 24 hr units of insulin aspart usage: 5 Electrolytes: WNL Goal K >= 4, mag >=2, phos ~3 Renal: SCr <1, BUN WNL Hepatic: Alk phos WNL, AST/ALT WNL, Total Bilirubin WNL, Total protein low at 6.1, albumin low at 2.5 Intake / Output; MIVF:  No MIVF Drains: NG/OG vented/dual lumen: 500, colostomy: 25, drain: 90 Urine output: 690 I/O Net: 240.1 GI Imaging: 10/10 CT Abdomen pelvis w contrast - small bowel enteritis/colitis that was also involving the sigmoid colon. No bowel obstruction seen.  10/17 CT Abdomen pelvis w/ contrast - wall thickening of the proximal sigmoid colon worrisome for neoplasm. Focal colitis not excluded. There is upstream colonic dilatation with air-fluid levels and large amount of stool compatible with obstruction.  There is also a 2.9 mm cystic mass at the tail of the pancreas.  10/18 MR abdomen w wo contrast - 9 mm cystic lesion within pancreatic body/tail junction, colonic obstruction 10/31 CT Abdomen pelvis w contrast - colostomy and Hartmann's pouch seen.  Appears to be extravasation of contrast suggesting perforation or leakage.  Using Clinimix  8/10 due to IVF shortage caused by [facility] per hospital policy. Will not be able to meet full nutrition goals due to limited stock and fluid restrictions. Will replace electrolytes and give lipids outside of Clinimix.   GI Surgeries / Procedures:  10/19 flexible sigmoidoscopy 10/21 colectomy with colostomy creation/Hartman procedure 11/1: CT guided visceral fluid drain by perc cath Central access: PICC line placed 11/1 TPN start date: 11/1  Nutritional Goals: Current TPN rate is 41.7 mL/hr + lipids + Dextrose 10% (provides 80 g of protein, 1499 kcals, and 2.25 L per day)  RD Assessment:  Estimated Needs Total Energy Estimated Needs: 1900-2100 Total Protein Estimated Needs: 90-105g Total Fluid Estimated Needs: 2L/day  Current Nutrition:  NPO except for ice chips  Plan:  At 1800: Continue Clinimix 8/10 TPN with electrolytes at 41.6 mL/hr Electrolytes in TPN: Na 35 mEq/L, K 30 mEq/L, Ca 4.5 mEq/L, Mg 5 mEq/L, and Phos 15 mmol/L. Cl:Ac 1:1 Add standard MVI and trace elements to TPN, no chromium due to shortage SMOF Lipid 20% over 12 hours Dextrose 10% IV at 41.6 ml/hr   Continue Sensitive SSI q6h and adjust as needed Monitor TPN labs on Mon/Thurs   Lynden Ang, PharmD, BCPS 10/14/2023,10:50 AM

## 2023-10-15 DIAGNOSIS — K56609 Unspecified intestinal obstruction, unspecified as to partial versus complete obstruction: Secondary | ICD-10-CM | POA: Diagnosis not present

## 2023-10-15 DIAGNOSIS — D62 Acute posthemorrhagic anemia: Secondary | ICD-10-CM | POA: Diagnosis not present

## 2023-10-15 DIAGNOSIS — I824Y2 Acute embolism and thrombosis of unspecified deep veins of left proximal lower extremity: Secondary | ICD-10-CM | POA: Diagnosis not present

## 2023-10-15 DIAGNOSIS — N949 Unspecified condition associated with female genital organs and menstrual cycle: Secondary | ICD-10-CM | POA: Diagnosis not present

## 2023-10-15 LAB — URINALYSIS, W/ REFLEX TO CULTURE (INFECTION SUSPECTED)
Bacteria, UA: NONE SEEN
Bilirubin Urine: NEGATIVE
Glucose, UA: NEGATIVE mg/dL
Ketones, ur: NEGATIVE mg/dL
Leukocytes,Ua: NEGATIVE
Nitrite: NEGATIVE
Protein, ur: 30 mg/dL — AB
Specific Gravity, Urine: 1.033 — ABNORMAL HIGH (ref 1.005–1.030)
pH: 5 (ref 5.0–8.0)

## 2023-10-15 LAB — TRIGLYCERIDES: Triglycerides: 128 mg/dL (ref <150)

## 2023-10-15 LAB — COMPREHENSIVE METABOLIC PANEL
ALT: 29 U/L (ref 0–44)
AST: 39 U/L (ref 15–41)
Albumin: 2.5 g/dL — ABNORMAL LOW (ref 3.5–5.0)
Alkaline Phosphatase: 94 U/L (ref 38–126)
Anion gap: 9 (ref 5–15)
BUN: 23 mg/dL (ref 8–23)
CO2: 23 mmol/L (ref 22–32)
Calcium: 8.3 mg/dL — ABNORMAL LOW (ref 8.9–10.3)
Chloride: 99 mmol/L (ref 98–111)
Creatinine, Ser: 0.62 mg/dL (ref 0.44–1.00)
GFR, Estimated: >60 mL/min (ref >60)
Glucose, Bld: 147 mg/dL — ABNORMAL HIGH (ref 70–99)
Potassium: 3.4 mmol/L — ABNORMAL LOW (ref 3.5–5.1)
Sodium: 131 mmol/L — ABNORMAL LOW (ref 135–145)
Total Bilirubin: 1 mg/dL (ref <1.2)
Total Protein: 6.5 g/dL (ref 6.5–8.1)

## 2023-10-15 LAB — CBC
HCT: 35.9 % — ABNORMAL LOW (ref 36.0–46.0)
Hemoglobin: 11.6 g/dL — ABNORMAL LOW (ref 12.0–15.0)
MCH: 30.9 pg (ref 26.0–34.0)
MCHC: 32.3 g/dL (ref 30.0–36.0)
MCV: 95.5 fL (ref 80.0–100.0)
Platelets: 346 10*3/uL (ref 150–400)
RBC: 3.76 MIL/uL — ABNORMAL LOW (ref 3.87–5.11)
RDW: 15.2 % (ref 11.5–15.5)
WBC: 10.1 10*3/uL (ref 4.0–10.5)
nRBC: 0 % (ref 0.0–0.2)

## 2023-10-15 LAB — GLUCOSE, CAPILLARY
Glucose-Capillary: 130 mg/dL — ABNORMAL HIGH (ref 70–99)
Glucose-Capillary: 132 mg/dL — ABNORMAL HIGH (ref 70–99)
Glucose-Capillary: 141 mg/dL — ABNORMAL HIGH (ref 70–99)
Glucose-Capillary: 142 mg/dL — ABNORMAL HIGH (ref 70–99)

## 2023-10-15 LAB — PHOSPHORUS: Phosphorus: 1.9 mg/dL — ABNORMAL LOW (ref 2.5–4.6)

## 2023-10-15 LAB — MAGNESIUM: Magnesium: 1.9 mg/dL (ref 1.7–2.4)

## 2023-10-15 MED ORDER — THIAMINE HCL 100 MG/ML IJ SOLN
100.0000 mg | Freq: Every day | INTRAMUSCULAR | Status: AC
  Administered 2023-10-15 – 2023-10-19 (×5): 100 mg via INTRAVENOUS
  Filled 2023-10-15 (×5): qty 2

## 2023-10-15 MED ORDER — DAPTOMYCIN-SODIUM CHLORIDE 500-0.9 MG/50ML-% IV SOLN
8.0000 mg/kg | Freq: Every day | INTRAVENOUS | Status: DC
  Administered 2023-10-15 – 2023-10-21 (×7): 500 mg via INTRAVENOUS
  Filled 2023-10-15 (×9): qty 50

## 2023-10-15 MED ORDER — INSULIN ASPART 100 UNIT/ML IJ SOLN
0.0000 [IU] | Freq: Three times a day (TID) | INTRAMUSCULAR | Status: DC
  Administered 2023-10-15 – 2023-10-23 (×17): 1 [IU] via SUBCUTANEOUS

## 2023-10-15 MED ORDER — TRACE MINERALS CU-MN-SE-ZN 300-55-60-3000 MCG/ML IV SOLN
INTRAVENOUS | Status: AC
  Filled 2023-10-15: qty 2000

## 2023-10-15 MED ORDER — SODIUM CHLORIDE 0.9 % IV SOLN
INTRAVENOUS | Status: AC
Start: 2023-10-15 — End: 2023-10-16

## 2023-10-15 MED ORDER — PIPERACILLIN-TAZOBACTAM 3.375 G IVPB 30 MIN
3.3750 g | Freq: Once | INTRAVENOUS | Status: AC
  Administered 2023-10-15: 3.375 g via INTRAVENOUS

## 2023-10-15 MED ORDER — DEXTROSE 10 % IV SOLN: INTRAVENOUS | Status: DC

## 2023-10-15 MED ORDER — POTASSIUM PHOSPHATES 15 MMOLE/5ML IV SOLN
30.0000 mmol | Freq: Once | INTRAVENOUS | Status: AC
  Administered 2023-10-15: 30 mmol via INTRAVENOUS
  Filled 2023-10-15: qty 10

## 2023-10-15 MED ORDER — FAT EMUL FISH OIL/PLANT BASED 20% (SMOFLIPID)IV EMUL
250.0000 mL | INTRAVENOUS | Status: AC
  Administered 2023-10-15: 250 mL via INTRAVENOUS
  Filled 2023-10-15: qty 250

## 2023-10-15 MED ORDER — LORAZEPAM 2 MG/ML IJ SOLN
0.5000 mg | Freq: Once | INTRAMUSCULAR | Status: AC | PRN
  Administered 2023-10-15: 0.5 mg via INTRAVENOUS
  Filled 2023-10-15: qty 1

## 2023-10-15 MED ORDER — ACETAMINOPHEN 10 MG/ML IV SOLN
1000.0000 mg | Freq: Once | INTRAVENOUS | Status: AC
  Administered 2023-10-15: 1000 mg via INTRAVENOUS
  Filled 2023-10-15: qty 100

## 2023-10-15 NOTE — Progress Notes (Addendum)
PHARMACY - TOTAL PARENTERAL NUTRITION CONSULT NOTE   Indication:  Prolonged ileus  Patient Measurements: Height: 5\' 5"  (165.1 cm) Weight: 64.1 kg (141 lb 5 oz) IBW/kg (Calculated) : 57 TPN AdjBW (KG): 62 Body mass index is 23.52 kg/m. Usual Weight: 64.5 on 10/04/2023  Assessment:  TPN per Pharmacy consult for this 82 yo female admitted on 09/27/2023 with recurrent abdominal pain, nausea, emesis, diarrhea and intolerance of oral intake. Surgery consulted -status post ex lap with small bowel resection and Hartman's resection and colostomy on 10/01/2023.  Patient continues to have postop ileus, managed by general surgery.  NG remains in place to suction. 11/31 imaging showed anastomic leak. 11/1 IR drain catheter placement into pelvic abscess.  Glucose / Insulin: No PMH of DM.  Goal CBGs < 150. 24 hr glucose range: 114-142 24 hr units of insulin aspart usage: 3 units Electrolytes: Na, K, Phos low.  CorrCa 9.5 Goal K >= 4, mag >=2, phos ~3 Renal: SCr <1, BUN WNL Hepatic: Alk phos WNL, AST/ALT WNL, Total Bilirubin WNL Intake / Output; MIVF: Net I/O + 450 mL Drains 130 mL, NG 400 mL, Urine 650 mL  Colostomy: no output charted  GI Imaging: 10/10 CT A/P: small bowel enteritis/colitis that was also involving the sigmoid colon. No bowel obstruction seen.  10/17 CT A/P: wall thickening sigmoid colon worrisome for neoplasm. Focal colitis not excluded. Colonic dilation compatible with obstruction.  Cystic mass at the tail of the pancreas.  10/18 MR - 9 mm cystic lesion within pancreatic body/tail junction, colonic obstruction 10/31 CT A/P - colostomy and Hartmann's pouch seen.  Appears to be extravasation of contrast suggesting perforation or leakage. GI Surgeries / Procedures:  10/19 flexible sigmoidoscopy 10/21 colectomy with colostomy creation/Hartman procedure 11/1: CT guided visceral fluid drain by perc cath  Central access: PICC line placed 11/1 TPN start date: 11/1  Nutritional  Goals: Due to severe national shortage of IV fluids related to [facility] in [facility], pharmacy will utilize best available premix TPN formula to meet at least 80% of protein and 75% of kcal goals  TPN:  Clinimix 8/10, 1L x5 days per week and 2L x2 days per week with daily lipids ( ) to provide 103 g protein and an average of 1353 kcal per day D10 at 40 ml/hr provides an additional 326 kcal   Total:  80 g of protein (89% of goal), 1679 kcals (88% of goal)   RD Assessment:  Estimated Needs Total Energy Estimated Needs: 1900-2100 Total Protein Estimated Needs: 90-105g Total Fluid Estimated Needs: 2L/day  Current Nutrition:  NPO except for ice chips TPN  Plan:  Now: KPhos 30 mmol per MD  At 1800: Clinimix 8/10 TPN with electrolytes Alternate 2000 mL twice weekly (Mon/Thursday), 1000 mL other days Electrolytes in TPN: Na 35 mEq/L, K 30 mEq/L, Ca 4.5 mEq/L, Mg 5 mEq/L, and Phos 15 mmol/L. Cl:Ac 1:1 Add standard MVI and trace elements to TPN, no chromium due to shortage SMOF Lipid 20% over 12 hours Dextrose 10% IV at 40 ml/hr  Thiamine 100mg  IV daily x 5 days (11/4-11/8) Continue Sensitive SSI q8h and adjust as needed Monitor TPN labs on Mon/Thurs    Elizabeth Odom PharmD, BCPS [facility] main pharmacy [phone] 10/15/2023 8:20 AM

## 2023-10-15 NOTE — Progress Notes (Signed)
Progress Note  14 Days Post-Op  Subjective: Seen with RN and ID. No new complaints. Pain stable. Thinks she has had some flatus per ostomy.  Objective: Vital signs in last 24 hours: Temp:  [97.3 F (36.3 C)-98.2 F (36.8 C)] 98.2 F (36.8 C) (11/04 0805) Pulse Rate:  [25-115] 76 (11/04 0805) Resp:  [13-21] 17 (11/04 0805) BP: (99-131)/(37-86) 131/47 (11/04 0805) SpO2:  [97 %-100 %] 98 % (11/04 0805) Last BM Date : 10/14/23  Intake/Output from previous day: 11/03 0701 - 11/04 0700 In: 1631.6 [I.V.:1451.5; IV Piggyback:150.1] Out: 1180 [Urine:650; Emesis/NG output:400; Drains:130] Intake/Output this shift: Total I/O In: -  Out: 505 [Urine:475; Drains:30]  PE: General: pleasant, WD, thin female who is laying in bed in NAD Lungs: Respiratory effort nonlabored Abd: soft, appropriately ttp, midline wound clean, stoma viable with stool in ostomy bag JP: dark red brown output    Lab Results:  Recent Labs    10/13/23 1326 10/14/23 0952  WBC 11.7* 13.2*  HGB 12.6 12.3  HCT 39.3 39.5  PLT 351 346   BMET Recent Labs    10/14/23 0952 10/15/23 0723  NA 140 131*  K 4.1 3.4*  CL 101 99  CO2 28 23  GLUCOSE 118* 147*  BUN 23 23  CREATININE 0.63 0.62  CALCIUM 9.1 8.3*   PT/INR No results for input(s): "LABPROT", "INR" in the last 72 hours.  CMP     Component Value Date/Time   NA 131 (L) 10/15/2023 0723   K 3.4 (L) 10/15/2023 0723   CL 99 10/15/2023 0723   CO2 23 10/15/2023 0723   GLUCOSE 147 (H) 10/15/2023 0723   BUN 23 10/15/2023 0723   CREATININE 0.62 10/15/2023 0723   CALCIUM 8.3 (L) 10/15/2023 0723   PROT 6.5 10/15/2023 0723   ALBUMIN 2.5 (L) 10/15/2023 0723   AST 39 10/15/2023 0723   ALT 29 10/15/2023 0723   ALKPHOS 94 10/15/2023 0723   BILITOT 1.0 10/15/2023 0723   GFRNONAA >60 10/15/2023 0723   GFRAA >60 04/10/2016 1352   Lipase     Component Value Date/Time   LIPASE 41 09/27/2023 2007       Studies/Results: No results  found.  Anti-infectives: Anti-infectives (From admission, onward)    Start     Dose/Rate Route Frequency Ordered Stop   10/15/23 1800  vancomycin (VANCOCIN) IVPB 1000 mg/200 mL premix        1,000 mg 200 mL/hr over 60 Minutes Intravenous Every 24 hours 10/14/23 1740     10/14/23 1800  vancomycin (VANCOREADY) IVPB 1250 mg/250 mL        1,250 mg 166.7 mL/hr over 90 Minutes Intravenous  Once 10/14/23 1740 10/14/23 2255   10/14/23 1400  piperacillin-tazobactam (ZOSYN) IVPB 3.375 g        3.375 g 12.5 mL/hr over 240 Minutes Intravenous Every 8 hours 10/14/23 0845     10/14/23 0900  piperacillin-tazobactam (ZOSYN) IVPB 3.375 g        3.375 g 100 mL/hr over 30 Minutes Intravenous NOW 10/14/23 0844 10/14/23 1002   10/05/23 1200  piperacillin-tazobactam (ZOSYN) IVPB 3.375 g        3.375 g 12.5 mL/hr over 240 Minutes Intravenous Every 8 hours 10/05/23 0721 10/09/23 1716   10/04/23 2000  piperacillin-tazobactam (ZOSYN) IVPB 3.375 g  Status:  Discontinued        3.375 g 12.5 mL/hr over 240 Minutes Intravenous Every 8 hours 10/04/23 1827 10/05/23 0721   09/27/23 2315  piperacillin-tazobactam (ZOSYN)  IVPB 3.375 g  Status:  Discontinued        3.375 g 12.5 mL/hr over 240 Minutes Intravenous Every 8 hours 09/27/23 2305 10/02/23 1106        Assessment/Plan  POD14 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 [name] - CT 10/24 with hematoma in the pelvis adjacent to small bowel anastomosis and ileus vs obstruction  - IR 11/1: drain placement for small bowel anastomotic leak  - PICC and TPN. NG output seems to be improving - 449ml/24h. no ileostomy output - drain output 130/24h (2ml/24h prior 24h) - Drain aspirate growing Staph aureus, abx per ID - WBC elevated yesterday to 13. Afebrile. Will recheck today  Await bowel function and drain output to trend down before clamping trial. Consider repeat CT in next 48 hours if not improving  FEN: NGT to LIWS,  PICC/TPN VTE: IVC Filter  ID: no current abx  - per TRH -  ABL anemia secondary to post-op hematomas and hematuria  Acute PE and LLE DVT Pancreatic tail cyst Thyroid nodule  Chronic HFpEF Paroxysmal A. Fib Pre-diabetes RLS  LOS: 18 days    Eric Form, [facility] 10/15/2023, 10:14 AM Please see Amion for pager number during day hours 7:00am-4:30pm
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
## 2023-10-15 NOTE — Progress Notes (Signed)
Physical Therapy Treatment Patient Details Name: KEYIA MORETTO MRN: [id number] DOB: [birth year] Today's Date: 10/15/2023   History of Present Illness Patient is a 82 year old female who presented on 10/17 with acute colitis. on10/21, patient underwent ex laparotomy with hartman's resection and colostomy and small bowel resection. PMH: a fib, RLS, syncope, cervical CA, cervical spine fusions, anemia    PT Comments  Pt seen on step down ICU RM # [id number] Pt did poorly this session requiring Total Assist just to sit EOB.  General Comments: decreased level of alterness with increased confusion this session.  Required repeat VC's to stay on task.  Repeat c/i from Pt that she did not feel well. General bed mobility comments: pt required Total Assist + 2 for all bed mobility.  Pt feel "bad" and present with increased confusion.  EOB seated was limited < 2 min as pt was unable to static sit on her own.  Severe posterior lean.  Total Assist just to sit EOB.  RR increased from 32 at rest 44.  Mild c/o dizzziness.  All other vitals WNL. Returned back to supine and positioned to comfort. Reported to RN.    If plan is discharge home, recommend the following:     Can travel by private vehicle        Equipment Recommendations       Recommendations for Other Services       Precautions / Restrictions Precautions Precautions: Fall Precaution Comments: abd surgery; colostomy; NG tube, MRSA Restrictions Weight Bearing Restrictions: No     Mobility  Bed Mobility Overal bed mobility: Needs Assistance Bed Mobility: Supine to Sit, Sit to Supine     Supine to sit: Total assist, +2 for physical assistance, +2 for safety/equipment, HOB elevated Sit to supine: Total assist, +2 for physical assistance, +2 for safety/equipment   General bed mobility comments: pt required Total Assist + 2 for all bed mobility.  Pt feel "bad" and present with increased confusion.  EOB seated was limited < 2 min as pt was unable  to static sit on her own.  Severe posterior lean.  Total Assist just to sit EOB.  RR increased from 32 at rest 44.  Mild c/o dizzziness.  Returned back to supine and positioned to comfort.    Transfers                   General transfer comment: unable to tolerate any OOB activity today    Ambulation/Gait                   Stairs             Wheelchair Mobility     Tilt Bed    Modified Rankin (Stroke Patients Only)       Balance                                            Cognition                                       General Comments: decreased level of alterness with increased confusion this session.  Required repeat VC's to stay on task.  Repeat c/i from Pt that she did not feel well.        Exercises  General Comments        Pertinent Vitals/Pain Pain Assessment Pain Assessment: Faces Faces Pain Scale: Hurts little more Pain Location: abdomen and B legs(new) Pain Descriptors / Indicators: Grimacing, Operative site guarding Pain Intervention(s): Monitored during session, Premedicated before session, Repositioned    Home Living                          Prior Function            PT Goals (current goals can now be found in the care plan section) Progress towards PT goals: Not progressing toward goals - comment    Frequency           PT Plan      Co-evaluation              AM-PAC PT "6 Clicks" Mobility   Outcome Measure                   End of Session Equipment Utilized During Treatment: Gait belt Activity Tolerance: Treatment limited secondary to medical complications (Comment) (new increased alertness and new MRSA Dx) Patient left: in bed;with call bell/phone within reach;with bed alarm set Nurse Communication: Mobility status PT Visit Diagnosis: Difficulty in walking, not elsewhere classified (R26.2);Muscle weakness (generalized) (M62.81)     Time:  1610-9604 PT Time Calculation (min) (ACUTE ONLY): 26 min  Charges:    $Therapeutic Activity: 23-37 mins PT General Charges $$ ACUTE PT VISIT: 1 Visit                     Felecia Shelling  PTA Acute  Rehabilitation Services Office M-F          [phone]

## 2023-10-15 NOTE — Plan of Care (Signed)
  Problem: Education: Goal: Ability to describe self-care measures that may prevent or decrease complications (Diabetes Survival Skills Education) will improve Outcome: Progressing   Problem: Coping: Goal: Ability to adjust to condition or change in health will improve Outcome: Progressing   Problem: Fluid Volume: Goal: Ability to maintain a balanced intake and output will improve Outcome: Progressing   Problem: Nutritional: Goal: Maintenance of adequate nutrition will improve Outcome: Progressing   Problem: Tissue Perfusion: Goal: Adequacy of tissue perfusion will improve Outcome: Progressing   Problem: Clinical Measurements: Goal: Respiratory complications will improve Outcome: Progressing   Problem: Skin Integrity: Goal: Risk for impaired skin integrity will decrease Outcome: Progressing

## 2023-10-15 NOTE — Progress Notes (Signed)
Brief ID note  Drain Cx+ MRSA->vancomycin added Continue pip-tazo, 10/29 was last dose prior ot aspiration on 11/1   MODERATE WBC PRESENT, PREDOMINANTLY PMN MODERATE GRAM POSITIVE COCCI FEW GRAM NEGATIVE RODS Performed at [facility], [address], [place], [place] [address]   Culture ABUNDANT METHICILLIN RESISTANT STAPHYLOCOCCUS AUREUS NO ANAEROBES ISOLATED; CULTURE IN PROGRESS FOR 5 DAYS  Report Status PENDING  Organism ID, Bacteria METHICILLIN RESISTANT STAPHYLOCOCCUS AUREUS  Resulting Agency [facility]     Susceptibility   Methicillin resistant staphylococcus aureus    MIC    CIPROFLOXACIN >=8 RESISTANT Resistant    CLINDAMYCIN <=0.25 SENS... Sensitive    ERYTHROMYCIN >=8 RESISTANT Resistant    GENTAMICIN <=0.5 SENSI... Sensitive    Inducible Clindamycin NEGATIVE Sensitive    LINEZOLID 2 SENSITIVE Sensitive    OXACILLIN >=4 RESISTANT Resistant    RIFAMPIN <=0.5 SENSI... Sensitive    TETRACYCLINE <=1 SENSITIVE Sensitive    TRIMETH/SULFA >=320 RESIS... Resistant    VANCOMYCIN 1 SENSITIVE Sensitive

## 2023-10-15 NOTE — Progress Notes (Signed)
[facility]                                                                               Elizabeth Odom, is a 82 y.o. female, DOB - June 10, 1941, ZOX:[id number] Admit date - 09/27/2023    Outpatient Primary MD for the patient is [name], FNP  LOS - 18  days    Brief summary   82 year old F with PMH of A-fib, RLS and anemia presented to ED on 10/17 with recurrent abdominal pain, nausea, emesis, diarrhea and intolerance of oral intake.  Patient was seen in ED on 10/10 for abdominal pain, decreased appetite and fever and discharged on Augmentin for possible colitis noted on CT abdomen and pelvis.  However, she has recurrence of his symptoms after interval improvement and presented back to the hospital, hospitalist called for admission, GI and general surgery called for consult.   CT initially showed proximal sigmoid colon wall thickening with upstream colonic dilation with air-fluid levels and large amount of stool compatible with obstruction.  There is also a 9 mm cystic lesion in the tail of pancreas, and subcentimeter lucent lesion in L1 vertebral body.  GI consulted, patient refused colonoscopy but agreed to sigmoidoscopy showing stricture versus mass at the rectosigmoid region -scope could not be advanced past this position.  Surgery consulted -status post ex lap with small bowel resection and Hartman's resection and colostomy on 10/01/2023.  Patient continues to have postop ileus, managed by general surgery.  NG remains in place to suction.   Hospital course complicated by acute PE and LLE DVT for which she was started on heparin -after which she unfortunately developed profound acute blood loss anemia with subsequent pelvic hematoma near the small bowel anastomosis as well as notable hematuria.  Urology consulted, signed off after resolution of hematuria. Given high risk of bleed with notable PE/DVT patient was evaluated by IR who placed IVC filter on 10/05/2023.  Patient  required total of 4 unit of blood transfusion, hemoglobin now stabilizing -no indication for further imaging or intervention at this time.   Patient continues to work with PT OT, patient will likely require placement given her prolonged hospitalization, weakness, and diminished ambulatory status from baseline.  Repeat CT abd and pelvis shows anastomotic leak into 7 x 6 cm complex fluid collection posteriorly in the pelvis . IR consulted and she underwent CT guided percutaneous drain placement into pelvic abscess.  Fluid sent for analysis and culture. Cultures growing MRSA.vancomycin added.  Discussed the plan with the grand daughter.     Assessment & Plan    Assessment and Plan:  Large Bowel Obstruction:  10/17 CT with proximal sigmoid colon thickening.  10/19 sigmoidoscopy with tight stricture - was found to have a mass biopsy negative for malignancy as suggesting inflammation.  10/21 s/p ex lap hartman's resection , small bowel resection and ostomy.  General surgery on board.  Repeat CT abd pelvis ordered showed a small leak at her anastomosis going into a 6 x 7 cm fluid collection . IR Consulted and she underwent  Drainage catheter placement for pelvic abscess.  Fluid culture report show MRSA, vancomycin added to the regimen.  ID  consulted  for antibiotics.  Patient is also adamant about not having any more surgical interventions.  Pain controlled. NPO except for ice chips  Post  operative ileus vs partial SBO Exacerbated by intra abd bleeding.  CT abd and pelvis done and results reviewed.  She remains on NG tube with suction.    Acute blood loss anemia sec to pelvic/ peri anastomotic hematomas with Hematuria S/p 4 units of prbc transfusions.  S/p IVC filter,  Urology recommended no indication for cystoscopy or intervention.  Hemoglobin stable around 12.    Acute PE with right heart strain with acute left leg DVT Unable to tolerate anti coagulation , hence IVc filter placed  on 10/25    Nodule in the inf aspect of the isthmus Thyroid US - follow up outpatient.    PAF Rate controlled. Not on anti coagulation.    Hyponatremia  Resolved.    Hypokalemia:  Replaced.   Leukocytosis:  Slowly worsening.   Hypophosphatemia:  Replaced. Repeat levels wnl.   Urinary retention:  - s/p 2 in and out the last 24 hours .  - get UA na urine culture.  - no changes in antibiotics.  - recommend out of bed .    Hypotension:  - suspect from hypovolemia.  - started her on IV fluids NS at 170ml/hr for 24 hours.  - recheck labs in am.     Goals of care: Changed to DNR/DNI 10/24. -Palliative medicine on board   Body mass index is 23.15 kg/m.    Nutrition:  TPN started by Surgery.   RN Pressure Injury Documentation: Pressure Injury 10/08/23 Buttocks Right;Mid Stage 2 -  Partial thickness loss of dermis presenting as a shallow open injury with a red, pink wound bed without slough. (Active)  10/08/23 0800  Location: Buttocks  Location Orientation: Right;Mid  Staging: Stage 2 -  Partial thickness loss of dermis presenting as a shallow open injury with a red, pink wound bed without slough.  Wound Description (Comments):   Present on Admission:   Dressing Type Foam - Lift dressing to assess site every shift 10/15/23 0805   Local wound care.   Estimated body mass index is 23.52 kg/m as calculated from the following:   Height as of this encounter: 5\' 5"  (1.651 m).   Weight as of this encounter: 64.1 kg.  Code Status: DNR limited.  DVT Prophylaxis:  Place and maintain sequential compression device Start: 10/06/23 1319   Level of Care: Level of care: Stepdown Family Communication: None at bedside.   Disposition Plan:     Remains inpatient appropriate: remains NPO with suction.   Procedures:  CT abd and pelvis.  10/21 s/p ex lap hartman's resection , small bowel resection and ostomy.   Consultants:   General surgery.  IR Infectious disease.    Antimicrobials:   Anti-infectives (From admission, onward)    Start     Dose/Rate Route Frequency Ordered Stop   10/15/23 1800  vancomycin (VANCOCIN) IVPB 1000 mg/200 mL premix        1,000 mg 200 mL/hr over 60 Minutes Intravenous Every 24 hours 10/14/23 1740     10/14/23 1800  vancomycin (VANCOREADY) IVPB 1250 mg/250 mL        1,250 mg 166.7 mL/hr over 90 Minutes Intravenous  Once 10/14/23 1740 10/14/23 2255   10/14/23 1400  piperacillin-tazobactam (ZOSYN) IVPB 3.375 g        3.375 g 12.5 mL/hr over 240 Minutes Intravenous Every 8 hours 10/14/23 0845  10/14/23 0900  piperacillin-tazobactam (ZOSYN) IVPB 3.375 g        3.375 g 100 mL/hr over 30 Minutes Intravenous NOW 10/14/23 0844 10/14/23 1002   10/05/23 1200  piperacillin-tazobactam (ZOSYN) IVPB 3.375 g        3.375 g 12.5 mL/hr over 240 Minutes Intravenous Every 8 hours 10/05/23 0721 10/09/23 1716   10/04/23 2000  piperacillin-tazobactam (ZOSYN) IVPB 3.375 g  Status:  Discontinued        3.375 g 12.5 mL/hr over 240 Minutes Intravenous Every 8 hours 10/04/23 1827 10/05/23 0721   09/27/23 2315  piperacillin-tazobactam (ZOSYN) IVPB 3.375 g  Status:  Discontinued        3.375 g 12.5 mL/hr over 240 Minutes Intravenous Every 8 hours 09/27/23 2305 10/02/23 1106        Medications  Scheduled Meds:  bisacodyl  10 mg Rectal Once   bisacodyl  10 mg Rectal Daily   Chlorhexidine Gluconate Cloth  6 each Topical Daily   insulin aspart  0-9 Units Subcutaneous Q6H   ketotifen  2 drop Both Eyes BID   sodium chloride flush  10-40 mL Intracatheter Q12H   sodium chloride flush  5 mL Intracatheter Q8H   Continuous Infusions:  TPN (CLINIMIX-E) Adult 41.6 mL/hr at 10/15/23 0304   And   dextrose 41.6 mL/hr at 10/15/23 0304   piperacillin-tazobactam (ZOSYN)  IV 3.375 g (10/15/23 0523)   potassium PHOSPHATE IVPB (in mmol)     vancomycin     PRN Meds:.acetaminophen **OR** acetaminophen, HYDROmorphone (DILAUDID) injection, liver  oil-zinc oxide, LORazepam, ondansetron **OR** ondansetron (ZOFRAN) IV, phenol, polyvinyl alcohol, prochlorperazine, sodium chloride flush    Subjective:   Elizabeth Odom was seen and examined today.  Overnight pt had urinary retention and did not urinate .  S/p inand out .  Patient requesting to advance diet.    Objective:   Vitals:   10/14/23 2000 10/15/23 0400 10/15/23 0500 10/15/23 0805  BP:   99/86 (!) 131/47  Pulse:   72 76  Resp:   (!) 21 17  Temp: 97.9 F (36.6 C) 97.9 F (36.6 C)    TempSrc: Oral Oral    SpO2:   97% 98%  Weight:      Height:        Intake/Output Summary (Last 24 hours) at 10/15/2023 0838 Last data filed at 10/15/2023 0805 Gross per 24 hour  Intake 1512.89 ml  Output 1190 ml  Net 322.89 ml   Filed Weights   10/12/23 0425 10/13/23 0500 10/14/23 0725  Weight: 62 kg 62 kg 64.1 kg     Exam General exam: Appears calm and comfortable  Respiratory system: Clear to auscultation. Respiratory effort normal. Cardiovascular system: S1 & S2 heard, RRR. No JVD,  Gastrointestinal system: Abdomen is soft. Ostomy does not have stool in the bag. Pelvic drain in place.  Central nervous system: Alert and oriented. No focal neurological deficits. Extremities: Symmetric 5 x 5 power. Skin: No rashes, lesions or ulcers Psychiatry: Mood & affect appropriate.        Data Reviewed:  I have personally reviewed following labs and imaging studies   CBC Lab Results  Component Value Date   WBC 13.2 (H) 10/14/2023   RBC 4.04 10/14/2023   HGB 12.3 10/14/2023   HCT 39.5 10/14/2023   MCV 97.8 10/14/2023   MCH 30.4 10/14/2023   PLT 346 10/14/2023   MCHC 31.1 10/14/2023   RDW 15.1 10/14/2023   LYMPHSABS 0.8 10/13/2023   MONOABS 0.9 10/13/2023  EOSABS 0.1 10/13/2023   BASOSABS 0.1 10/13/2023     Last metabolic panel Lab Results  Component Value Date   NA 131 (L) 10/15/2023   K 3.4 (L) 10/15/2023   CL 99 10/15/2023   CO2 23 10/15/2023   BUN 23  10/15/2023   CREATININE 0.62 10/15/2023   GLUCOSE 147 (H) 10/15/2023   GFRNONAA >60 10/15/2023   GFRAA >60 04/10/2016   CALCIUM 8.3 (L) 10/15/2023   PHOS 1.9 (L) 10/15/2023   PROT 6.5 10/15/2023   ALBUMIN 2.5 (L) 10/15/2023   BILITOT 1.0 10/15/2023   ALKPHOS 94 10/15/2023   AST 39 10/15/2023   ALT 29 10/15/2023   ANIONGAP 9 10/15/2023    CBG (last 3)  Recent Labs    10/14/23 1727 10/15/23 0009 10/15/23 0513  GLUCAP 138* 132* 142*      Coagulation Profile: Recent Labs  Lab 10/11/23 1255 10/12/23 0655  INR 1.1 1.1     Radiology Studies: No results found.     Kathlen Mody M.D. [facility] 10/15/2023, 8:38 AM  Available via Epic secure chat 7am-7pm After 7 pm, please refer to night coverage provider listed on amion.

## 2023-10-15 NOTE — Progress Notes (Signed)
Secure chat with Orpah Cobb, RN regarding PICC exchange.  Per Lurena Joiner PICC exchange is no longer needed as patient now has another IV.

## 2023-10-15 NOTE — Plan of Care (Signed)
  Problem: Coping: Goal: Ability to adjust to condition or change in health will improve Outcome: Progressing   Problem: Fluid Volume: Goal: Ability to maintain a balanced intake and output will improve Outcome: Progressing   Problem: Metabolic: Goal: Ability to maintain appropriate glucose levels will improve Outcome: Progressing   Problem: Nutritional: Goal: Maintenance of adequate nutrition will improve Outcome: Progressing   Problem: Education: Goal: Knowledge of General Education information will improve Description: Including pain rating scale, medication(s)/side effects and non-pharmacologic comfort measures Outcome: Progressing   Problem: Clinical Measurements: Goal: Respiratory complications will improve Outcome: Progressing Goal: Cardiovascular complication will be avoided Outcome: Progressing   Problem: Skin Integrity: Goal: Risk for impaired skin integrity will decrease Outcome: Not Progressing

## 2023-10-15 NOTE — Progress Notes (Signed)
[facility]   Reason for visit: Follow up on pelvic abscess  Interval History: culture with MRSA from abdomen, previous CT with anastomotic leak.   Day 2 antibiotics - vancomycin and PipTazo Pip/tazo from 10/17 to 10/21 and 10/24 to 10/29  Physical Exam: Constitutional:  Vitals:   10/15/23 0500 10/15/23 0805  BP: 99/86 (!) 131/47  Pulse: 72 76  Resp: (!) 21 17  Temp:  98.2 F (36.8 C)  SpO2: 97% 98%   patient appears in NAD Respiratory: Normal respiratory effort; CTA B Back: drain in place, no fluctuance, no erythema  Review of Systems: Constitutional: negative for fevers and chills  Lab Results  Component Value Date   WBC 13.2 (H) 10/14/2023   HGB 12.3 10/14/2023   HCT 39.5 10/14/2023   MCV 97.8 10/14/2023   PLT 346 10/14/2023    Lab Results  Component Value Date   CREATININE 0.62 10/15/2023   BUN 23 10/15/2023   NA 131 (L) 10/15/2023   K 3.4 (L) 10/15/2023   CL 99 10/15/2023   CO2 23 10/15/2023    Lab Results  Component Value Date   ALT 29 10/15/2023   AST 39 10/15/2023   ALKPHOS 94 10/15/2023     Microbiology: Recent Results (from the past 240 hour(s))  Aerobic/Anaerobic Culture w Gram Stain (surgical/deep wound)     Status: None (Preliminary result)   Collection Time: 10/12/23  3:44 PM   Specimen: Abscess  Result Value Ref Range Status   Specimen Description   Final    ABSCESS Performed at [facility], [address] [place], [place] [address]    Special Requests   Final    NONE Performed at [facility], [address], [place], [place] [address]    Gram Stain   Final    MODERATE WBC PRESENT, PREDOMINANTLY PMN MODERATE GRAM POSITIVE COCCI FEW GRAM NEGATIVE RODS Performed at [facility], [address] [place], [place] [address]    Culture   Final    ABUNDANT METHICILLIN RESISTANT STAPHYLOCOCCUS AUREUS NO ANAEROBES ISOLATED; CULTURE IN PROGRESS FOR 5 DAYS     Report Status PENDING  Incomplete   Organism ID, Bacteria METHICILLIN RESISTANT STAPHYLOCOCCUS AUREUS  Final      Susceptibility   Methicillin resistant staphylococcus aureus - MIC*    CIPROFLOXACIN >=8 RESISTANT Resistant     ERYTHROMYCIN >=8 RESISTANT Resistant     GENTAMICIN <=0.5 SENSITIVE Sensitive     OXACILLIN >=4 RESISTANT Resistant     TETRACYCLINE <=1 SENSITIVE Sensitive     VANCOMYCIN 1 SENSITIVE Sensitive     TRIMETH/SULFA >=320 RESISTANT Resistant     CLINDAMYCIN <=0.25 SENSITIVE Sensitive     RIFAMPIN <=0.5 SENSITIVE Sensitive     Inducible Clindamycin NEGATIVE Sensitive     LINEZOLID 2 SENSITIVE Sensitive     * ABUNDANT METHICILLIN RESISTANT STAPHYLOCOCCUS AUREUS    Impression/Plan:  1. Pelvic abscess - growth noted with MRsA though I anticipate this is polymicrobial so will continue with piperacillin/tazobactam for now.  Will use daptomycin in place of vancomycin   I have personally spent 55 minutes involved in face-to-face and non-face-to-face activities for this patient on the day of the visit. Professional time spent includes the following activities: Preparing to see the patient (review of tests), Obtaining and/or reviewing separately obtained history (admission/discharge record), Performing a medically appropriate examination and/or evaluation , Ordering medications/tests/procedures, referring and communicating with other health care professionals, Documenting clinical  information in the EMR, Independently interpreting results (not separately reported), Communicating results to the patient/family/caregiver, Counseling and educating the patient/family/caregiver and Care coordination (not separately reported).

## 2023-10-15 NOTE — Progress Notes (Signed)
Referring Physician(s): * No referring provider recorded for this case *  Supervising Physician: Irish Lack  Patient Status:  [facility] [place] - In-pt  Chief Complaint:  S/p colostomy due to bowel obstruction 10/01/23, development of post anastomotic leak with pelvic fluid collection  S/p r TG drain placement by Dr. Milford Cage on 10/12/23   Subjective:  Patient lying in bed watching TV. No complaints of pain, states her right transgluteal drain is tender.   Allergies: Elemental sulfur  Medications: Prior to Admission medications   Medication Sig Start Date End Date Taking? Authorizing Provider  amoxicillin-clavulanate (AUGMENTIN) 875-125 MG tablet Take 1 tablet by mouth every 12 (twelve) hours. 09/21/23  Yes Horton, Kristie M, DO  ondansetron (ZOFRAN-ODT) 4 MG disintegrating tablet Take 4 mg by mouth every 8 (eight) hours as needed for vomiting or nausea. 09/16/23  Yes [provider]  meclizine (ANTIVERT) 25 MG tablet Take 25 mg by mouth 3 (three) times daily. Patient not taking: Reported on 11/23/2020    [provider]  Omega-3 Fatty Acids (FISH OIL) 500 MG CAPS Take 1 capsule by mouth daily. Patient not taking: Reported on 09/27/2023    [provider]     Vital Signs: BP (!) 137/56 (BP Location: Right Arm)   Pulse 77   Temp (!) 100.9 F (38.3 C) (Axillary) Comment: reported to rn  Resp (!) 26   Ht 5\' 5"  (1.651 m)   Wt 141 lb 5 oz (64.1 kg)   SpO2 98%   BMI 23.52 kg/m   Physical Exam Abdominal:     Comments: Right transgluteal drain tender to touch. Dressing clean and, dry and stitch intact. Buldb drain charged, with approximately 10 cc's of dark fluid.  Neurological:     Mental Status: She is alert.     Imaging: CT GUIDED VISCERAL FLUID DRAIN BY [facility] CATH  Result Date: 10/12/2023 INDICATION: [id number] Abscess [id number] EXAM: CT-GUIDED PELVIC ABSCESS DRAINAGE CATHETER PLACEMENT COMPARISON:  CT AP, 10/11/2023 MEDICATIONS: The patient is currently  admitted to the hospital and receiving intravenous antibiotics. The antibiotics were administered within an appropriate time frame prior to the initiation of the procedure. ANESTHESIA/SEDATION: Moderate (conscious) sedation was employed during this procedure. A total of Versed 1.5 mg and Fentanyl 75 mcg was administered intravenously. Moderate Sedation Time: 25 minutes. The patient's level of consciousness and vital signs were monitored continuously by radiology nursing throughout the procedure under my direct supervision. CONTRAST:  None FLUOROSCOPY TIME:  CT dose; 948 mGycm COMPLICATIONS: None immediate. PROCEDURE: RADIATION DOSE REDUCTION: This exam was performed according to the departmental dose-optimization program which includes automated exposure control, adjustment of the mA and/or kV according to patient size and/or use of iterative reconstruction technique. Informed written consent was obtained from the patient and/or patient's representative after a discussion of the risks, benefits and alternatives to treatment. The patient was placed prone on the CT gantry and a pre procedural CT was performed re-demonstrating the known abscess/fluid collection within the pelvis. The procedure was planned. A timeout was performed prior to the initiation of the procedure. The RIGHT gluteus was prepped and draped in the usual sterile fashion. The overlying soft tissues were anesthetized with 1% lidocaine with epinephrine. Appropriate trajectory was planned with the use of a 22 gauge spinal needle. An 18 gauge trocar needle was advanced into the abscess/fluid collection and a short Amplatz super stiff wire was coiled within the collection. Appropriate positioning was confirmed with a limited CT scan. The tract was serially dilated allowing  placement of a 12 Fr drainage catheter. Appropriate positioning was confirmed with a limited postprocedural CT scan. 10 mL of serosanguineous fluid was aspirated. The tube was connected  to a drainage bag and sutured in place. A dressing was placed. The patient tolerated the procedure well without immediate post procedural complication. IMPRESSION: Successful CT guided placement of a 12 Fr percutaneous drain catheter into the pelvic abscess via a RIGHT transgluteal approach Aspiration of 10 mL of serous sanguinous fluid. Samples were sent to the laboratory as requested by the ordering clinical team. Roanna Banning, MD [facility] Radiology Electronically Signed   By: Roanna Banning M.D.   On: 10/12/2023 17:20    Labs:  CBC: Recent Labs    10/12/23 0655 10/13/23 1326 10/14/23 0952 10/15/23 1319  WBC 8.6 11.7* 13.2* 10.1  HGB 12.3 12.6 12.3 11.6*  HCT 38.4 39.3 39.5 35.9*  PLT 348 351 346 346    COAGS: Recent Labs    09/20/23 1902 10/05/23 0247 10/11/23 1255 10/12/23 0655  INR 1.1 1.1 1.1 1.1  APTT  --  30  --   --     BMP: Recent Labs    10/12/23 0655 10/13/23 0254 10/14/23 0952 10/15/23 0723  NA 139 135 140 131*  K 3.3* 3.6 4.1 3.4*  CL 102 99 101 99  CO2 26 28 28 23   GLUCOSE 102* 137* 118* 147*  BUN 18 21 23 23   CALCIUM 8.7* 8.5* 9.1 8.3*  CREATININE 0.64 0.70 0.63 0.62  GFRNONAA >60 >60 >60 >60    LIVER FUNCTION TESTS: Recent Labs    10/07/23 0313 10/08/23 0318 10/12/23 0655 10/15/23 0723  BILITOT 1.4* 1.6* 1.2 1.0  AST 22 23 23  39  ALT 23 25 23 29   ALKPHOS 90 107 78 94  PROT 5.7* 5.8* 6.1* 6.5  ALBUMIN 2.3* 2.4* 2.5* 2.5*    Assessment and Plan:  S/p colostomy due to bowel obstruction 10/01/23, development of post anastomotic leak with pelvic fluid collection  S/p r TG drain placement by Dr. Milford Cage on 11/24  T_max today 100.9. Drain cultures are pending.   Drain Location: Right transgluteal bulb drain Size: Fr size: 12 Fr Date of placement: 11/1 Currently to: Drain collection device: suction bulb 24 hour output:  Output by Drain (mL) 10/13/23 0701 - 10/13/23 1900 10/13/23 1901 -  10/14/23 0700 10/14/23 0701 - 10/14/23 1900 10/14/23 1901 - 10/15/23 0700 10/15/23 0701 - 10/15/23 1616  Closed System Drain 1 Right Buttock 45 45 45 85 45    Interval imaging/drain manipulation:   None  Current examination: Flushes/aspirates easily.  Insertion site unremarkable. Suture and stat lock in place. Dressed appropriately.   Plan: Continue TID flushes with 5 cc NS. Record output Q shift. Dressing changes QD or PRN if soiled.  Call IR APP or on call IR MD if difficulty flushing or sudden change in drain output.  Repeat imaging/possible drain injection once output < 10 mL/QD (excluding flush material). Consideration for drain removal if output is < 10 mL/QD (excluding flush material), pending discussion with the providing surgical service.  Discharge planning: Please contact IR APP or on call IR MD prior to patient d/c to ensure appropriate follow up plans are in place. Typically patient will follow up with IR clinic 10-14 days post d/c for repeat imaging/possible drain injection. IR scheduler will contact patient with date/time of appointment. Patient will need to flush drain QD with 5 cc NS, record output QD, dressing changes every  2-3 days or earlier if soiled.   IR will continue to follow - please call with questions or concerns.     Electronically Signed: Ardith Dark, NP 10/15/2023, 4:02 PM   I spent a total of 15 Minutes at the the patient's bedside AND on the patient's hospital floor or unit, greater than 50% of which was counseling/coordinating care for right transgluteal bulb drain follow-up    Patient ID: Elizabeth Odom, female   DOB: 19-Feb-1941, 82 y.o.   MRN: [id number]

## 2023-10-15 NOTE — Progress Notes (Signed)
Patient is having increased agitation, seeing and talking to her deceased mother. Patient requested pain meds due to generalized pain with score of 10/10. Patient's granddaughter called by this nurse, NP, at the bedside. Patient is refusing care of any kind. Granddaughter is coming to talk with the patient tonight in person per patient request so her wishes can be carried out. PRN IV pain meds and anxiety meds being held until granddaughter arrives so patient can be able to express her needs. Per patient request.

## 2023-10-16 DIAGNOSIS — Z01810 Encounter for preprocedural cardiovascular examination: Secondary | ICD-10-CM

## 2023-10-16 DIAGNOSIS — I824Y2 Acute embolism and thrombosis of unspecified deep veins of left proximal lower extremity: Secondary | ICD-10-CM | POA: Diagnosis not present

## 2023-10-16 DIAGNOSIS — D62 Acute posthemorrhagic anemia: Secondary | ICD-10-CM | POA: Diagnosis not present

## 2023-10-16 DIAGNOSIS — R4589 Other symptoms and signs involving emotional state: Secondary | ICD-10-CM | POA: Diagnosis not present

## 2023-10-16 DIAGNOSIS — Z515 Encounter for palliative care: Secondary | ICD-10-CM | POA: Diagnosis not present

## 2023-10-16 DIAGNOSIS — Z79899 Other long term (current) drug therapy: Secondary | ICD-10-CM

## 2023-10-16 DIAGNOSIS — L8990 Pressure ulcer of unspecified site, unspecified stage: Secondary | ICD-10-CM | POA: Insufficient documentation

## 2023-10-16 DIAGNOSIS — R52 Pain, unspecified: Secondary | ICD-10-CM

## 2023-10-16 DIAGNOSIS — Z7189 Other specified counseling: Secondary | ICD-10-CM | POA: Diagnosis not present

## 2023-10-16 DIAGNOSIS — K56609 Unspecified intestinal obstruction, unspecified as to partial versus complete obstruction: Secondary | ICD-10-CM | POA: Diagnosis not present

## 2023-10-16 LAB — CBC
HCT: 31.3 % — ABNORMAL LOW (ref 36.0–46.0)
Hemoglobin: 10 g/dL — ABNORMAL LOW (ref 12.0–15.0)
MCH: 30.3 pg (ref 26.0–34.0)
MCHC: 31.9 g/dL (ref 30.0–36.0)
MCV: 94.8 fL (ref 80.0–100.0)
Platelets: 309 10*3/uL (ref 150–400)
RBC: 3.3 MIL/uL — ABNORMAL LOW (ref 3.87–5.11)
RDW: 15.3 % (ref 11.5–15.5)
WBC: 8.7 10*3/uL (ref 4.0–10.5)
nRBC: 0 % (ref 0.0–0.2)

## 2023-10-16 LAB — BASIC METABOLIC PANEL
Anion gap: 7 (ref 5–15)
BUN: 21 mg/dL (ref 8–23)
CO2: 21 mmol/L — ABNORMAL LOW (ref 22–32)
Calcium: 7.7 mg/dL — ABNORMAL LOW (ref 8.9–10.3)
Chloride: 104 mmol/L (ref 98–111)
Creatinine, Ser: 0.31 mg/dL — ABNORMAL LOW (ref 0.44–1.00)
GFR, Estimated: >60 mL/min (ref >60)
Glucose, Bld: 135 mg/dL — ABNORMAL HIGH (ref 70–99)
Potassium: 3.2 mmol/L — ABNORMAL LOW (ref 3.5–5.1)
Sodium: 132 mmol/L — ABNORMAL LOW (ref 135–145)

## 2023-10-16 LAB — GLUCOSE, CAPILLARY
Glucose-Capillary: 144 mg/dL — ABNORMAL HIGH (ref 70–99)
Glucose-Capillary: 119 mg/dL — ABNORMAL HIGH (ref 70–99)

## 2023-10-16 LAB — PHOSPHORUS: Phosphorus: 1.7 mg/dL — ABNORMAL LOW (ref 2.5–4.6)

## 2023-10-16 LAB — MAGNESIUM: Magnesium: 1.7 mg/dL (ref 1.7–2.4)

## 2023-10-16 LAB — CK: Total CK: 231 U/L (ref 38–234)

## 2023-10-16 MED ORDER — FAT EMUL FISH OIL/PLANT BASED 20% (SMOFLIPID)IV EMUL
250.0000 mL | INTRAVENOUS | Status: AC
  Administered 2023-10-16: 250 mL via INTRAVENOUS
  Filled 2023-10-16: qty 250

## 2023-10-16 MED ORDER — TRACE MINERALS CU-MN-SE-ZN 300-55-60-3000 MCG/ML IV SOLN
41.0000 mL/h | INTRAVENOUS | Status: AC
  Filled 2023-10-16: qty 1000

## 2023-10-16 MED ORDER — POTASSIUM CHLORIDE 10 MEQ/50ML IV SOLN
10.0000 meq | INTRAVENOUS | Status: AC
  Administered 2023-10-16 (×4): 10 meq via INTRAVENOUS
  Filled 2023-10-16 (×4): qty 50

## 2023-10-16 MED ORDER — FAT EMUL FISH OIL/PLANT BASED 20% (SMOFLIPID)IV EMUL: 250.0000 mL | INTRAVENOUS | Status: DC

## 2023-10-16 MED ORDER — LORAZEPAM 2 MG/ML IJ SOLN
0.5000 mg | INTRAMUSCULAR | Status: DC | PRN
  Administered 2023-10-16 – 2023-10-20 (×2): 0.5 mg via INTRAVENOUS
  Filled 2023-10-16 (×2): qty 1

## 2023-10-16 MED ORDER — HYDROMORPHONE HCL 1 MG/ML IJ SOLN
0.5000 mg | INTRAMUSCULAR | Status: DC
  Administered 2023-10-16 – 2023-10-17 (×5): 0.5 mg via INTRAVENOUS
  Filled 2023-10-16 (×5): qty 1

## 2023-10-16 MED ORDER — HYDROMORPHONE HCL 1 MG/ML IJ SOLN: 1.0000 mg | INTRAMUSCULAR | Status: DC | PRN

## 2023-10-16 MED ORDER — DEXTROSE 10 % IV SOLN: INTRAVENOUS | Status: DC

## 2023-10-16 MED ORDER — DAKINS (1/4 STRENGTH) 0.125 % EX SOLN
Freq: Two times a day (BID) | CUTANEOUS | Status: AC
  Administered 2023-10-18: 1
  Filled 2023-10-16: qty 473

## 2023-10-16 MED ORDER — TRACE MINERALS CU-MN-SE-ZN 300-55-60-3000 MCG/ML IV SOLN: 41.0000 mL/h | INTRAVENOUS | Status: DC

## 2023-10-16 MED ORDER — MAGNESIUM SULFATE 2 GM/50ML IV SOLN
2.0000 g | Freq: Once | INTRAVENOUS | Status: AC
  Administered 2023-10-16: 2 g via INTRAVENOUS
  Filled 2023-10-16: qty 50

## 2023-10-16 MED ORDER — POTASSIUM PHOSPHATES 15 MMOLE/5ML IV SOLN
30.0000 mmol | Freq: Once | INTRAVENOUS | Status: AC
  Administered 2023-10-16: 30 mmol via INTRAVENOUS
  Filled 2023-10-16: qty 10

## 2023-10-16 NOTE — Progress Notes (Signed)
[facility]                                                                               Elizabeth Odom, is a 82 y.o. female, DOB - 1941/11/26, GMW:[id number] Admit date - 09/27/2023    Outpatient Primary MD for the patient is [name], FNP  LOS - 19  days    Brief summary   82 year old F with PMH of A-fib, RLS and anemia presented to ED on 10/17 with recurrent abdominal pain, nausea, emesis, diarrhea and intolerance of oral intake.  Patient was seen in ED on 10/10 for abdominal pain, decreased appetite and fever and discharged on Augmentin for possible colitis noted on CT abdomen and pelvis.  However, she has recurrence of his symptoms after interval improvement and presented back to the hospital, hospitalist called for admission, GI and general surgery called for consult.   CT initially showed proximal sigmoid colon wall thickening with upstream colonic dilation with air-fluid levels and large amount of stool compatible with obstruction.  There is also a 9 mm cystic lesion in the tail of pancreas, and subcentimeter lucent lesion in L1 vertebral body.  GI consulted, patient refused colonoscopy but agreed to sigmoidoscopy showing stricture versus mass at the rectosigmoid region -scope could not be advanced past this position.  Surgery consulted -status post ex lap with small bowel resection and Hartman's resection and colostomy on 10/01/2023.  Patient continues to have postop ileus, managed by general surgery.  NG remains in place to suction.   Hospital course complicated by acute PE and LLE DVT for which she was started on heparin -after which she unfortunately developed profound acute blood loss anemia with subsequent pelvic hematoma near the small bowel anastomosis as well as notable hematuria.  Urology consulted, signed off after resolution of hematuria. Given high risk of bleed with notable PE/DVT patient was evaluated by IR who placed IVC filter on 10/05/2023.  Patient  required total of 4 unit of blood transfusion, hemoglobin now stabilizing -no indication for further imaging or intervention at this time.   Patient continues to work with PT OT, patient will likely require placement given her prolonged hospitalization, weakness, and diminished ambulatory status from baseline.  As her bowel function did not return, she underwent a repeat CT abd and pelvis on 10/31 showing  anastomotic leak into 7 x 6 cm complex fluid collection posteriorly in the pelvis . IR consulted and she underwent CT guided percutaneous drain placement into pelvic abscess.  Fluid sent for analysis and culture. Cultures growing MRSA. ID consulted and she was transitioned to daptomycin and zosyn.  Overnight patient was emotional and wanted to go home. Palliative care consulted for goals of care and recommendations given.    Assessment & Plan    Assessment and Plan:  Large Bowel Obstruction:  10/17 CT with proximal sigmoid colon thickening.  10/19 sigmoidoscopy with tight stricture - was found to have a mass biopsy negative for malignancy as suggesting inflammation.  10/21 s/p ex lap hartman's resection , small bowel resection and ostomy.  General surgery on board.  Repeat CT abd pelvis on 10/31 showed a small leak at her anastomosis going  into a 6 x 7 cm fluid collection . IR Consulted and she underwent  Drainage catheter placement for pelvic abscess on 11/1.  Fluid culture report show MRSA, ID on board and transitioned to Daptomycin and zosyn. Duration as per ID.  Patient is also adamant about not having any more surgical interventions.  Today she has some stool and fluid in the ileostomy.  General surgery suggested clears as tolerated.  NG tube remains in and can be clamped while on clears.    Post  operative ileus vs partial SBO Exacerbated by intra abd bleeding.  CT abd and pelvis done and results reviewed.  She remains on NG tube for now.  Keep K >4 and magnesium greater than  2.    Acute blood loss anemia sec to pelvic/ peri anastomotic hematomas with Hematuria S/p 4 units of prbc transfusions.  S/p IVC filter,  Urology recommended no indication for cystoscopy or intervention.  Hemoglobin trending down from 12  on 10/14/2023 to 10 g/dl today.  Continue to monitor.    Acute PE with right heart strain with acute left leg DVT Unable to tolerate anti coagulation , hence IVc filter placed on 10/25    Nodule in the inf aspect of the isthmus Thyroid US - follow up outpatient.    PAF Rate controlled. Not on anti coagulation.   Hyponatremia:  Nutritional , monitor.   Hypokalemia and hypophosphatemia and hypomagnesemia Replaced.  Recheck in am.   Urinary retention:  - s/p 2 in and out the last 24 hours .  - UA is negative for infection.   Hypotension: resolved.    Leukocytosis  Resolved.     Goals of care: Changed to DNR/DNI 10/24. -Palliative medicine on board   Body mass index is 23.15 kg/m.    Nutrition:  TPN started by Surgery.   RN Pressure Injury Documentation: Pressure Injury 10/08/23 Buttocks Right;Mid Stage 2 -  Partial thickness loss of dermis presenting as a shallow open injury with a red, pink wound bed without slough. (Active)  10/08/23 0800  Location: Buttocks  Location Orientation: Right;Mid  Staging: Stage 2 -  Partial thickness loss of dermis presenting as a shallow open injury with a red, pink wound bed without slough.  Wound Description (Comments):   Present on Admission:   Dressing Type Foam - Lift dressing to assess site every shift 10/15/23 2000   Local wound care.   Estimated body mass index is 21.98 kg/m as calculated from the following:   Height as of this encounter: 5\' 5"  (1.651 m).   Weight as of this encounter: 59.9 kg.  Code Status: DNR limited.  DVT Prophylaxis:  Place and maintain sequential compression device Start: 10/06/23 1319   Level of Care: Level of care: Stepdown Family Communication:  None at bedside. Discussed the plan with grand daughter over the phone on 11/4  Disposition Plan:     Remains inpatient appropriate: awaiting bowel function to return. Might need placement on discharge.   Procedures:  CT abd and pelvis.  10/21 s/p ex lap hartman's resection , small bowel resection and ostomy.   Consultants:   General surgery.  IR Infectious disease.   Antimicrobials:   Anti-infectives (From admission, onward)    Start     Dose/Rate Route Frequency Ordered Stop   10/15/23 1800  vancomycin (VANCOCIN) IVPB 1000 mg/200 mL premix  Status:  Discontinued        1,000 mg 200 mL/hr over 60 Minutes Intravenous Every 24 hours 10/14/23  1740 10/15/23 1045   10/15/23 1430  piperacillin-tazobactam (ZOSYN) IVPB 3.375 g        3.375 g 100 mL/hr over 30 Minutes Intravenous  Once 10/15/23 1343 10/15/23 1503   10/15/23 1400  DAPTOmycin (CUBICIN) IVPB 500 mg/68mL premix        8 mg/kg  64.1 kg 100 mL/hr over 30 Minutes Intravenous Daily 10/15/23 1045     10/14/23 1800  vancomycin (VANCOREADY) IVPB 1250 mg/250 mL        1,250 mg 166.7 mL/hr over 90 Minutes Intravenous  Once 10/14/23 1740 10/14/23 2255   10/14/23 1400  piperacillin-tazobactam (ZOSYN) IVPB 3.375 g        3.375 g 12.5 mL/hr over 240 Minutes Intravenous Every 8 hours 10/14/23 0845     10/14/23 0900  piperacillin-tazobactam (ZOSYN) IVPB 3.375 g        3.375 g 100 mL/hr over 30 Minutes Intravenous NOW 10/14/23 0844 10/14/23 1002   10/05/23 1200  piperacillin-tazobactam (ZOSYN) IVPB 3.375 g        3.375 g 12.5 mL/hr over 240 Minutes Intravenous Every 8 hours 10/05/23 0721 10/09/23 1716   10/04/23 2000  piperacillin-tazobactam (ZOSYN) IVPB 3.375 g  Status:  Discontinued        3.375 g 12.5 mL/hr over 240 Minutes Intravenous Every 8 hours 10/04/23 1827 10/05/23 0721   09/27/23 2315  piperacillin-tazobactam (ZOSYN) IVPB 3.375 g  Status:  Discontinued        3.375 g 12.5 mL/hr over 240 Minutes Intravenous Every 8 hours  09/27/23 2305 10/02/23 1106        Medications  Scheduled Meds:  bisacodyl  10 mg Rectal Daily   Chlorhexidine Gluconate Cloth  6 each Topical Daily    HYDROmorphone (DILAUDID) injection  0.5 mg Intravenous Q3H   insulin aspart  0-9 Units Subcutaneous Q8H   ketotifen  2 drop Both Eyes BID   sodium chloride flush  10-40 mL Intracatheter Q12H   sodium chloride flush  5 mL Intracatheter Q8H   thiamine (VITAMIN B1) injection  100 mg Intravenous Daily   Continuous Infusions:  DAPTOmycin Stopped (10/15/23 1414)   dextrose 40 mL/hr at 10/16/23 1145   TPN (CLINIMIX-E) Adult     And   fat emul(SMOFlipid)     magnesium sulfate bolus IVPB     piperacillin-tazobactam (ZOSYN)  IV Stopped (10/15/23 0924)   potassium chloride     potassium PHOSPHATE IVPB (in mmol) 30 mmol (10/16/23 1148)   TPN (CLINIMIX-E) Adult 83 mL/hr at 10/16/23 0543   PRN Meds:.acetaminophen **OR** acetaminophen, HYDROmorphone (DILAUDID) injection, liver oil-zinc oxide, LORazepam, LORazepam, ondansetron **OR** ondansetron (ZOFRAN) IV, phenol, polyvinyl alcohol, prochlorperazine, sodium chloride flush    Subjective:   Elizabeth Odom was seen and examined today.  Discussed about pain control.  She wants to get out of bed.  Started her on clears.  Some abdominal pain. Feels like she is going to have a BM.    Objective:   Vitals:   10/16/23 0900 10/16/23 1000 10/16/23 1100 10/16/23 1139  BP: (!) 113/41 (!) 120/59 (!) 116/44   Pulse: 66 74 69 77  Resp: 19 (!) 21 (!) 21 (!) 27  Temp:      TempSrc:      SpO2: 95% 97% 97% 96%  Weight:      Height:        Intake/Output Summary (Last 24 hours) at 10/16/2023 1153 Last data filed at 10/16/2023 0800 Gross per 24 hour  Intake 3561.41 ml  Output  2390 ml  Net 1171.41 ml   Filed Weights   10/13/23 0500 10/14/23 0725 10/16/23 0500  Weight: 62 kg 64.1 kg 59.9 kg     Exam General exam: elderly woman, ill appearing, not in distress.  Respiratory system: Clear to  auscultation. Respiratory effort normal. Cardiovascular system: S1 & S2 heard, RRR. No JVD,  Gastrointestinal system: Abdomen is soft, with liquid stool in the colostomy , pelvic drain in place, output decreasing. Mild tenderness at the site of the midline wound.  Central nervous system: Alert and oriented to place and person. Grossly non focal.  Extremities: no pedal edema.  Skin: No rashes, Psychiatry:  anxious, but answering all questions appropriately.         Data Reviewed:  I have personally reviewed following labs and imaging studies   CBC Lab Results  Component Value Date   WBC 8.7 10/16/2023   RBC 3.30 (L) 10/16/2023   HGB 10.0 (L) 10/16/2023   HCT 31.3 (L) 10/16/2023   MCV 94.8 10/16/2023   MCH 30.3 10/16/2023   PLT 309 10/16/2023   MCHC 31.9 10/16/2023   RDW 15.3 10/16/2023   LYMPHSABS 0.8 10/13/2023   MONOABS 0.9 10/13/2023   EOSABS 0.1 10/13/2023   BASOSABS 0.1 10/13/2023     Last metabolic panel Lab Results  Component Value Date   NA 132 (L) 10/16/2023   K 3.2 (L) 10/16/2023   CL 104 10/16/2023   CO2 21 (L) 10/16/2023   BUN 21 10/16/2023   CREATININE 0.31 (L) 10/16/2023   GLUCOSE 135 (H) 10/16/2023   GFRNONAA >60 10/16/2023   GFRAA >60 04/10/2016   CALCIUM 7.7 (L) 10/16/2023   PHOS 1.7 (L) 10/16/2023   PROT 6.5 10/15/2023   ALBUMIN 2.5 (L) 10/15/2023   BILITOT 1.0 10/15/2023   ALKPHOS 94 10/15/2023   AST 39 10/15/2023   ALT 29 10/15/2023   ANIONGAP 7 10/16/2023    CBG (last 3)  Recent Labs    10/15/23 1300 10/15/23 1649 10/16/23 0749  GLUCAP 130* 141* 144*      Coagulation Profile: Recent Labs  Lab 10/11/23 1255 10/12/23 0655  INR 1.1 1.1     Radiology Studies: No results found.     Kathlen Mody M.D. [facility] 10/16/2023, 11:53 AM  Available via Epic secure chat 7am-7pm After 7 pm, please refer to night coverage provider listed on amion.

## 2023-10-16 NOTE — Progress Notes (Signed)
During dressing change this afternoon, patient's dressing was saturated with teal green discharge. Paged Dr. Corliss Skains with surgical team. Per Dr. Corliss Skains to cleanse wound with ordered Daiken's solution and soak gauze with Daiken's.   Additionally, patient's NGT still clamped; per Dr. Corliss Skains if patient pulls NGT out during the night to not attempt to replace during night shift but evaluate patient, page surgical team if needed.

## 2023-10-16 NOTE — Progress Notes (Signed)
Nutrition Follow-up  DOCUMENTATION CODES:   Non-severe (moderate) malnutrition in context of chronic illness  INTERVENTION:  - Plan to continue TPN at this time.   - TPN management per pharmacy.   - Daily weights while on TPN.   - Clear Liquid diet per Surgery.  - Recommend Boost Plus po BID once medically appropriate, each supplement provides 360 kcal and 14 grams of protein   NUTRITION DIAGNOSIS:   Moderate Malnutrition related to chronic illness as evidenced by moderate fat depletion, severe muscle depletion. *new  GOAL:   Patient will meet greater than or equal to 90% of their needs *progressing, on TPN and CL diet  MONITOR:   PO intake, Supplement acceptance, Diet advancement, Labs, Weight trends, I & O's  REASON FOR ASSESSMENT:   Consult New TPN/TNA  ASSESSMENT:   82 year old F with PMH of A-fib, RLS and anemia presented to ED on 10/17 with recurrent abdominal pain, nausea, emesis, diarrhea and intolerance of oral intake.  Patient was seen in ED on 10/10 for abdominal pain, decreased appetite and fever and discharged on Augmentin for possible colitis noted on CT abdomen and pelvis.  10/18: admitted 10/19: s/p flex sigmoidoscopy 10/21: s/p colectomy w/ colostomy 10/23: NPO 10/31: CLD -> NPO 11/1: TPN started 11/5: CLD  Patient in bed at time of visit. Reports she was not eating well for several days to weeks PTA. Occasionally drank Boost PTA.  Doing well on TPN. NGT remains in place.  Per Surgery, starting clear liquids today.  Patient voices concern about getting sick from oral intake. Discussed going slow with intake as tolerated. She would like to hold off on ONS for now but would like Boost Plus if able.   Per Pharmacy: Clinimix 8/10 1L x5 days per week and 2L x2 days per week with daily lipids ( ). Plus D10 at 40 ml/hr x24 hours provides an additional 326 kcal  Average daily total:  103 g of protein (100% of goal), 1679 kcals (88% of goal)     Medications reviewed and include: Dulcolax, Thiamine (11/4-11/8)  Labs reviewed:  Na 132 K+ 3.2 Phosphorus 1.7 Triglycerides 128 (as of 11/4)   NUTRITION - FOCUSED PHYSICAL EXAM:  Flowsheet Row Most Recent Value  Orbital Region Moderate depletion  Upper Arm Region Mild depletion  Thoracic and Lumbar Region Moderate depletion  Buccal Region Severe depletion  Temple Region Severe depletion  Clavicle Bone Region Severe depletion  Clavicle and Acromion Bone Region Severe depletion  Scapular Bone Region Unable to assess  Dorsal Hand Moderate depletion  Patellar Region No depletion  Anterior Thigh Region No depletion  Posterior Calf Region No depletion  Edema (RD Assessment) Mild  Hair Reviewed  Eyes Reviewed  Mouth Reviewed  Skin Reviewed  Nails Reviewed       Diet Order:   Diet Order             Diet clear liquid Fluid consistency: Thin  Diet effective now                   EDUCATION NEEDS:  Education needs have been addressed  Skin:  Skin Assessment: Skin Integrity Issues: Skin Integrity Issues:: Stage II, Incisions Stage II: mid sacrum Incisions: 10/21 abdomen  Last BM:  11/5 - colostomy  Height:  Ht Readings from Last 1 Encounters:  10/04/23 5\' 5"  (1.651 m)   Weight:  Wt Readings from Last 1 Encounters:  10/16/23 59.9 kg   BMI:  Body mass index is 21.98 kg/m.  Estimated Nutritional Needs:  Kcal:  1900-2100 Protein:  90-105g Fluid:  2L/day    Shelle Iron RD, LDN For contact information, refer to [facility].

## 2023-10-16 NOTE — Progress Notes (Signed)
Patient agitated and irritable, demanding to go home because she is gonna die to be with her mom. She also said that no one is listening to her needs and she wants to die in peace and her family needs to prepare themselves. Patient asked why no one have come to get her body, taken it to the funeral home and rolled her in the chapel yet. Patient is demanding that her needs and desires be met. Shortly after, this nurse left the room, patient used her call light a couple of times for ice chips and to be repositioned with assistance from another nurse.

## 2023-10-16 NOTE — Progress Notes (Signed)
15 Days Post-Op   Subjective/Chief Complaint: Patient a bit confused this morning "I just want to go home." Moderate amount of stool output in colostomy Drain output decreasing   Objective: Vital signs in last 24 hours: Temp:  [98.2 F (36.8 C)-100.9 F (38.3 C)] 99 F (37.2 C) (11/05 0350) Pulse Rate:  [47-101] 72 (11/05 0600) Resp:  [18-27] 27 (11/05 0600) BP: (97-140)/(36-68) 119/50 (11/05 0600) SpO2:  [93 %-98 %] 95 % (11/05 0600) Weight:  [59.9 kg] 59.9 kg (11/05 0500) Last BM Date : 10/16/23  Intake/Output from previous day: 11/04 0701 - 11/05 0700 In: 4556.1 [I.V.:4039.9; IV Piggyback:516.2] Out: 2340 [Urine:1675; Emesis/NG output:400; Drains:65; Stool:200] Intake/Output this shift: No intake/output data recorded.  Thin elderly female in NAD Mildly confused Abd - midline wound clean with minimal granulation tissue JP minimal reddish brown output  Lab Results:  Recent Labs    10/14/23 0952 10/15/23 1319  WBC 13.2* 10.1  HGB 12.3 11.6*  HCT 39.5 35.9*  PLT 346 346   BMET Recent Labs    10/14/23 0952 10/15/23 0723  NA 140 131*  K 4.1 3.4*  CL 101 99  CO2 28 23  GLUCOSE 118* 147*  BUN 23 23  CREATININE 0.63 0.62  CALCIUM 9.1 8.3*    Anti-infectives: Anti-infectives (From admission, onward)    Start     Dose/Rate Route Frequency Ordered Stop   10/15/23 1800  vancomycin (VANCOCIN) IVPB 1000 mg/200 mL premix  Status:  Discontinued        1,000 mg 200 mL/hr over 60 Minutes Intravenous Every 24 hours 10/14/23 1740 10/15/23 1045   10/15/23 1430  piperacillin-tazobactam (ZOSYN) IVPB 3.375 g        3.375 g 100 mL/hr over 30 Minutes Intravenous  Once 10/15/23 1343 10/15/23 1503   10/15/23 1400  DAPTOmycin (CUBICIN) IVPB 500 mg/43mL premix        8 mg/kg  64.1 kg 100 mL/hr over 30 Minutes Intravenous Daily 10/15/23 1045     10/14/23 1800  vancomycin (VANCOREADY) IVPB 1250 mg/250 mL        1,250 mg 166.7 mL/hr over 90 Minutes Intravenous  Once  10/14/23 1740 10/14/23 2255   10/14/23 1400  piperacillin-tazobactam (ZOSYN) IVPB 3.375 g        3.375 g 12.5 mL/hr over 240 Minutes Intravenous Every 8 hours 10/14/23 0845     10/14/23 0900  piperacillin-tazobactam (ZOSYN) IVPB 3.375 g        3.375 g 100 mL/hr over 30 Minutes Intravenous NOW 10/14/23 0844 10/14/23 1002   10/05/23 1200  piperacillin-tazobactam (ZOSYN) IVPB 3.375 g        3.375 g 12.5 mL/hr over 240 Minutes Intravenous Every 8 hours 10/05/23 0721 10/09/23 1716   10/04/23 2000  piperacillin-tazobactam (ZOSYN) IVPB 3.375 g  Status:  Discontinued        3.375 g 12.5 mL/hr over 240 Minutes Intravenous Every 8 hours 10/04/23 1827 10/05/23 0721   09/27/23 2315  piperacillin-tazobactam (ZOSYN) IVPB 3.375 g  Status:  Discontinued        3.375 g 12.5 mL/hr over 240 Minutes Intravenous Every 8 hours 09/27/23 2305 10/02/23 1106       Assessment/Plan: POD15 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 [name] - CT 10/24 with hematoma in the pelvis adjacent to small bowel anastomosis and ileus vs obstruction  - IR 11/1: drain placement for small bowel anastomotic leak/ abscess drainage - PICC and TPN.  - beginning to have bowel  function with colostomy output.  Decreasing NG output - drain output 65/24h (145ml/24h prior 24h) - Drain aspirate growing Staph aureus, abx per ID - WBC normal      FEN: NGT clamp, PICC/TPN; try clear liquids today VTE: IVC Filter  ID: no current abx   - per TRH -  ABL anemia secondary to post-op hematomas and hematuria  Acute PE and LLE DVT Pancreatic tail cyst Thyroid nodule  Chronic HFpEF Paroxysmal A. Fib Pre-diabetes RLS  LOS: 19 days    Wynona Luna 10/16/2023

## 2023-10-16 NOTE — TOC Progression Note (Signed)
Transition of Care [facility]) - Progression Note    Patient Details  Name: Elizabeth Odom MRN: [id number] Date of Birth: [birth year]  Transition of Care [facility] CM/SW Contact  Darleene Cleaver, [place] Phone Number: 10/16/2023, 6:15 PM  Clinical Narrative:     TOC continuing to follow patient's progress throughout discharge planning.  Patient was recommended by PT for SNF.  Palliative also following patient's progress.   Expected Discharge Plan: Home w Home Health Services Barriers to Discharge: Continued Medical Work up  Expected Discharge Plan and Services In-house Referral: Clinical Social Work   Post Acute Care Choice: Home Health Living arrangements for the past 2 months: Independent Living Facility, Apartment                 DME Arranged: N/A DME Agency: NA       HH Arranged: PT, RN HH Agency: [facility] Date HH Agency Contacted: 10/02/23 Time HH Agency Contacted: 1550 Representative spoke with at [facility] Agency: Morrie Sheldon   Social Determinants of Health (SDOH) Interventions SDOH Screenings   Food Insecurity: No Food Insecurity (09/28/2023)  Housing: Low Risk  (09/28/2023)  Transportation Needs: No Transportation Needs (09/28/2023)  Utilities: Not At Risk (09/28/2023)  Tobacco Use: Low Risk  (10/04/2023)    Readmission Risk Interventions     No data to display

## 2023-10-16 NOTE — Progress Notes (Signed)
Referring Physician(s): Tsuei,M  Supervising Physician: Ruel Favors  Patient Status:  [facility] - In-pt  Chief Complaint:  Pelvic abscess  Subjective: Pt drowsy but arousable, has some soreness at TG/pelvic drain site, afebrile   Allergies: Elemental sulfur  Medications: Prior to Admission medications   Medication Sig Start Date End Date Taking? Authorizing Provider  amoxicillin-clavulanate (AUGMENTIN) 875-125 MG tablet Take 1 tablet by mouth every 12 (twelve) hours. 09/21/23  Yes Horton, Kristie M, DO  ondansetron (ZOFRAN-ODT) 4 MG disintegrating tablet Take 4 mg by mouth every 8 (eight) hours as needed for vomiting or nausea. 09/16/23  Yes [provider]  meclizine (ANTIVERT) 25 MG tablet Take 25 mg by mouth 3 (three) times daily. Patient not taking: Reported on 11/23/2020    [provider]  Omega-3 Fatty Acids (FISH OIL) 500 MG CAPS Take 1 capsule by mouth daily. Patient not taking: Reported on 09/27/2023    [provider]     Vital Signs: BP (!) 107/52 (BP Location: Right Arm)   Pulse 72   Temp 98.5 F (36.9 C) (Axillary)   Resp 18   Ht 5\' 5"  (1.651 m)   Wt 132 lb 0.9 oz (59.9 kg)   SpO2 95%   BMI 21.98 kg/m   Physical Exam: drowsy but arousable; rt TG/pelvic drain intact, insertion site mildly tender, OP minimal today brown fluid, 65 cc's yesterday; drain flushed with minimal return   Imaging: CT GUIDED VISCERAL FLUID DRAIN BY PERC CATH  Result Date: 10/12/2023 INDICATION: [id number] Abscess [id number] EXAM: CT-GUIDED PELVIC ABSCESS DRAINAGE CATHETER PLACEMENT COMPARISON:  CT AP, 10/11/2023 MEDICATIONS: The patient is currently admitted to the hospital and receiving intravenous antibiotics. The antibiotics were administered within an appropriate time frame prior to the initiation of the procedure. ANESTHESIA/SEDATION: Moderate (conscious) sedation was employed during this procedure. A total of Versed 1.5 mg and Fentanyl 75 mcg was administered  intravenously. Moderate Sedation Time: 25 minutes. The patient's level of consciousness and vital signs were monitored continuously by radiology nursing throughout the procedure under my direct supervision. CONTRAST:  None FLUOROSCOPY TIME:  CT dose; 948 mGycm COMPLICATIONS: None immediate. PROCEDURE: RADIATION DOSE REDUCTION: This exam was performed according to the departmental dose-optimization program which includes automated exposure control, adjustment of the mA and/or kV according to patient size and/or use of iterative reconstruction technique. Informed written consent was obtained from the patient and/or patient's representative after a discussion of the risks, benefits and alternatives to treatment. The patient was placed prone on the CT gantry and a pre procedural CT was performed re-demonstrating the known abscess/fluid collection within the pelvis. The procedure was planned. A timeout was performed prior to the initiation of the procedure. The RIGHT gluteus was prepped and draped in the usual sterile fashion. The overlying soft tissues were anesthetized with 1% lidocaine with epinephrine. Appropriate trajectory was planned with the use of a 22 gauge spinal needle. An 18 gauge trocar needle was advanced into the abscess/fluid collection and a short Amplatz super stiff wire was coiled within the collection. Appropriate positioning was confirmed with a limited CT scan. The tract was serially dilated allowing placement of a 12 Fr drainage catheter. Appropriate positioning was confirmed with a limited postprocedural CT scan. 10 mL of serosanguineous fluid was aspirated. The tube was connected to a drainage bag and sutured in place. A dressing was placed. The patient tolerated the procedure well without immediate post procedural complication. IMPRESSION: Successful CT guided placement of a 12 Fr percutaneous drain catheter  into the pelvic abscess via a RIGHT transgluteal approach Aspiration of 10 mL of serous  sanguinous fluid. Samples were sent to the laboratory as requested by the ordering clinical team. Roanna Banning, MD [facility] Electronically Signed   By: Roanna Banning M.D.   On: 10/12/2023 17:20    Labs:  CBC: Recent Labs    10/13/23 1326 10/14/23 0952 10/15/23 1319 10/16/23 0846  WBC 11.7* 13.2* 10.1 8.7  HGB 12.6 12.3 11.6* 10.0*  HCT 39.3 39.5 35.9* 31.3*  PLT 351 346 346 309    COAGS: Recent Labs    09/20/23 1902 10/05/23 0247 10/11/23 1255 10/12/23 0655  INR 1.1 1.1 1.1 1.1  APTT  --  30  --   --     BMP: Recent Labs    10/13/23 0254 10/14/23 0952 10/15/23 0723 10/16/23 0846  NA 135 140 131* 132*  K 3.6 4.1 3.4* 3.2*  CL 99 101 99 104  CO2 28 28 23  21*  GLUCOSE 137* 118* 147* 135*  BUN 21 23 23 21   CALCIUM 8.5* 9.1 8.3* 7.7*  CREATININE 0.70 0.63 0.62 0.31*  GFRNONAA >60 >60 >60 >60    LIVER FUNCTION TESTS: Recent Labs    10/07/23 0313 10/08/23 0318 10/12/23 0655 10/15/23 0723  BILITOT 1.4* 1.6* 1.2 1.0  AST 22 23 23  39  ALT 23 25 23 29   ALKPHOS 90 107 78 94  PROT 5.7* 5.8* 6.1* 6.5  ALBUMIN 2.3* 2.4* 2.5* 2.5*    Assessment and Plan: 82 yo female with hx PE/LLE DVT, PAF, s/p Hartman's resection and colostomy with SBR for diverticular stricture 10/01/23; now with post op pelvic fluid collection/?hematoma, s/p drain placement 11/1(12 fr to JP); afebrile, WBC nl, hgb 10, creat 0.31, drain fl cx- MRSA; cont with drain irrigation, close output monitoring; if output remains low consider f/u CT to assess for adequacy of drainage/check appropriate positioning of catheter  Output by Drain (mL) 10/14/23 0701 - 10/14/23 1900 10/14/23 1901 - 10/15/23 0700 10/15/23 0701 - 10/15/23 1900 10/15/23 1901 - 10/16/23 0700 10/16/23 0701 - 10/16/23 1504  Closed System Drain 1 Right Buttock 45 85 55 10 5       Electronically Signed: D. Jeananne Rama, PA-C 10/16/2023, 2:46 PM   I spent a total of 15  Minutes at the the patient's bedside AND on the patient's hospital floor or unit, greater than 50% of which was counseling/coordinating care for pelvic abscess drain    Patient ID: Elizabeth Odom, female   DOB: Feb 10, 1941, 82 y.o.   MRN: [id number]

## 2023-10-16 NOTE — Progress Notes (Signed)
PHARMACY - TOTAL PARENTERAL NUTRITION CONSULT NOTE   Indication:  Prolonged ileus  Patient Measurements: Height: 5\' 5"  (165.1 cm) Weight: 59.9 kg (132 lb 0.9 oz) IBW/kg (Calculated) : 57 TPN AdjBW (KG): 62 Body mass index is 21.98 kg/m. Usual Weight: 64.5 on 10/04/2023  Assessment:  TPN per Pharmacy consult for this 82 yo female admitted on 09/27/2023 with recurrent abdominal pain, nausea, emesis, diarrhea and intolerance of oral intake. Surgery completed exp lap with small bowel resection and Hartman's resection and colostomy on 10/01/2023.  Patient continues to have postop ileus, managed by general surgery.  NG remains in place to suction. 11/31 imaging showed anastomic leak. 11/1 IR drain catheter placement into pelvic abscess.  Glucose / Insulin: No PMH of DM.  Goal CBGs < 150. 24 hr glucose range: 130-144 24 hr units of insulin aspart usage: 1 unit Electrolytes: Na remains mildly low, K decreased further to 3.2, Phos decreased further to 1.7, Cl low, CorrCa 8.9 - Goal K >= 4, mag >=2, phos ~3 - Unable to custom correct electrolytes in TPN, but will continue to supplement in and outside of TPN as possible.  Renal: SCr <1, BUN WNL Hepatic: Alk phos WNL, AST/ALT WNL, Total Bilirubin WNL Intake / Output; MIVF: Net I/O + 2.2 L Drains 65 mL, NG 400 mL, Urine 1675 mL, Stool increased to 200 mL mIVF: NS @ 100  GI Imaging: 10/10 CT A/P: small bowel enteritis/colitis that was also involving the sigmoid colon. No bowel obstruction seen.  10/17 CT A/P: wall thickening sigmoid colon worrisome for neoplasm. Focal colitis not excluded. Colonic dilation compatible with obstruction.  Cystic mass at the tail of the pancreas.  10/18 MR - 9 mm cystic lesion within pancreatic body/tail junction, colonic obstruction 10/31 CT A/P - colostomy and Hartmann's pouch seen.  Appears to be extravasation of contrast suggesting perforation or leakage. GI Surgeries / Procedures:  10/19 flexible  sigmoidoscopy 10/21 colectomy with colostomy creation/Hartman procedure 11/1: CT guided visceral fluid drain by perc cath  Central access: PICC line placed 11/1 TPN start date: 11/1  Nutritional Goals: Due to severe national shortage of IV fluids related to [facility] in [facility], pharmacy will utilize best available premix TPN formula to meet at least 80% of protein and 75% of kcal goals  TPN:  Clinimix 8/10, 1L x5 days per week and 2L x2 days per week with daily lipids ( ) to provide 103 g protein and an average of 1353 kcal per day D10 at 40 ml/hr provides an additional 326 kcal  Average daily total:  103 g of protein (100% of goal), 1679 kcals (88% of goal)   RD Assessment:  Estimated Needs Total Energy Estimated Needs: 1900-2100 Total Protein Estimated Needs: 90-105g Total Fluid Estimated Needs: 2L/day  Current Nutrition:  Clear liquids  - NG tube clamped 11/5  TPN  Plan:  Now:  Change 0.9% NaCl fluids back to D10 infusion at 40 ml/hr - discussed with Dr. Blake Divine KPhos 30 mmol IV x1 Mag 2g IV x1 KCl 10 mEq IV x 4    At 1800: Clinimix 8/10 TPN with electrolytes Alternate 2000 mL twice weekly (Monday/Thursday), 1000 mL other days Electrolytes in TPN: Na 35 mEq/L, K 30 mEq/L, Ca 4.5 mEq/L, Mg 5 mEq/L, and Phos 15 mmol/L. Cl:Ac 1:1 Add standard MVI and trace elements to TPN, no chromium due to shortage SMOF Lipid 20% over 12 hours Dextrose 10% IV at 40 ml/hr  Thiamine 100mg  IV daily x 5 days (11/4-11/8) Continue  Sensitive SSI q8h and adjust as needed Monitor TPN labs on Mon/Thurs and PRN   [name] PharmD, BCPS [facility] main pharmacy [phone] 10/16/2023 8:09 AM

## 2023-10-16 NOTE — Progress Notes (Signed)
Occupational Therapy Treatment Patient Details Name: Elizabeth Odom MRN: [id number] DOB: Mar 28, 1941 Today's Date: 10/16/2023   History of present illness Patient is a 82 year old female who presented on 10/17 with acute colitis. on10/21, patient underwent ex laparotomy with hartman's resection and colostomy and small bowel resection. PMH: a fib, RLS, syncope, cervical CA, cervical spine fusions, anemia   OT comments  Patient was able to engage in sitting EOB for small sips of italian ice. Patient limited with increased fatigue and pain requesting to get back into bed after this task.nurse in room during session.  Patient will benefit from continued inpatient follow up therapy, <3 hours/day.        If plan is discharge home, recommend the following:  A lot of help with bathing/dressing/bathroom;Assistance with cooking/housework;Assist for transportation;A lot of help with walking and/or transfers   Equipment Recommendations  None recommended by OT       Precautions / Restrictions Precautions Precautions: Fall Precaution Comments: abd surgery; colostomy; NG tube, MRSA Restrictions Other Position/Activity Restrictions: abdominal surgery, colostomy       Mobility Bed Mobility Overal bed mobility: Needs Assistance Bed Mobility: Supine to Sit, Sit to Supine   Sidelying to sit: Mod assist     Sit to sidelying: Max assist General bed mobility comments: with increased time and cues for rolling. nurse present for sit to supine for safety with lines.       Balance Overall balance assessment: Needs assistance Sitting-balance support: No upper extremity supported, Feet supported Sitting balance-Leahy Scale: Poor Sitting balance - Comments: posterior leaning.         ADL either performed or assessed with clinical judgement   ADL Overall ADL's : Needs assistance/impaired Eating/Feeding: Supervision/ safety;Set up;Sitting Eating/Feeding Details (indicate cue type and reason):  patient needed min A to mod a for sitting balance with increased time. patient was able to take small sips of defrosted icey sitting EOB with need for physical A to maintain balance with posterior leaning to get fluids. patients nurse present in room.            Cognition Arousal: Lethargic Behavior During Therapy: WFL for tasks assessed/performed, Flat affect Overall Cognitive Status: Within Functional Limits for tasks assessed       General Comments: noted to have some slurred speech initally but noted to clear up with increased alertness.                   Pertinent Vitals/ Pain       Pain Assessment Pain Assessment: Faces Faces Pain Scale: Hurts little more Pain Location: abdomen and B legs(new) Pain Descriptors / Indicators: Grimacing, Operative site guarding Pain Intervention(s): Limited activity within patient's tolerance, Monitored during session, Repositioned         Frequency  Min 1X/week        Progress Toward Goals  OT Goals(current goals can now be found in the care plan section)  Progress towards OT goals: Progressing toward goals (slow progress)     Plan         AM-PAC OT "6 Clicks" Daily Activity     Outcome Measure   Help from another person eating meals?: A Little Help from another person taking care of personal grooming?: A Little Help from another person toileting, which includes using toliet, bedpan, or urinal?: A Lot Help from another person bathing (including washing, rinsing, drying)?: A Lot Help from another person to put on and taking off regular upper body clothing?: A Little  Help from another person to put on and taking off regular lower body clothing?: A Lot 6 Click Score: 15    End of Session    OT Visit Diagnosis: Muscle weakness (generalized) (M62.81);Pain   Activity Tolerance Patient limited by fatigue;Patient limited by lethargy   Patient Left with call bell/phone within reach;in bed;with bed alarm set   Nurse  Communication Mobility status        Time: 1610-9604 OT Time Calculation (min): 18 min  Charges: OT General Charges $OT Visit: 1 Visit OT Treatments $Self Care/Home Management : 8-22 mins  Rosalio Loud, MS Acute Rehabilitation Department Office# [phone]   Selinda Flavin 10/16/2023, 4:58 PM

## 2023-10-16 NOTE — Progress Notes (Signed)
Daily Progress Note   Patient Name: Elizabeth Odom       Date: 10/16/2023 DOB: 1941/09/03  Age: 82 y.o. MRN#: [id number] Attending Physician: Kathlen Mody, MD Primary Care Physician: [name], FNP Admit Date: 09/27/2023 Length of Stay: 19 days  Reason for Consultation/Follow-up: Establishing goals of care  Subjective:   CC: Tired though today is a better day. Following up regarding complex medical decision making.   Subjective:  Asked by hospitalist to speak with patient today regarding complex medical decision making for medical care moving forward.  Patient overnight expressing wishes that she no longer wants medical interventions and wants to be allowed to die.  Discussed care with bedside RN for updates.  Presented to bedside to meet with patient initially in the morning.  Patient laying in bed, appears frail.  Reduced myself as a member of the palliative medicine team my role in patient's care.  Spent time learning about patient's medical journey and her prolonged hospitalization.  Patient describes discouragement over multiple setbacks.  Patient had been incredibly functional before all of this and has really never had to deal with serious medical illness or hospitalization.  Spent time providing emotional support via active listening.  Inquired about patient's wishes for medical care moving forward.  Patient notes that she does not want things to "prolong" her life.  Explained that the interventions she is currently getting such as TPN and IV fluid support are methods of prolonging her life while awaiting her got to regain function.  With permission able to discuss possible pathways for medical care moving forward including continuing with current level of medical interventions versus comfort focused care.  Spent time describing comfort focused care and discontinuation of aggressive medical interventions.  Also acknowledged that when 1 transition to comfort focused care, normally  time is getting shorter and people are accepting of death knowing that they do not want further medical interventions.  Patient hesitant about this.  Inquired if she had discussed with her family her wishes.  Patient noted she had and they were upset about it.  During conversation patient's daughter-in-law presented to bedside.  She noted that she is loses, patient's granddaughters, mother so she would ask Elizabeth Odom when she would be coming to bedside.  Patient agreeing with this as she feels Elizabeth Odom is her right hand person and would be the person to discuss medical care moving forward with.  Acknowledges and noted would return to bedside later on when list was present.  Warm later on in the day that patient's granddaughter, Elizabeth Odom, had presented to bedside.  Again presented to bedside and introduced myself and the role of the palliative medicine team.  Again discussed possible pathways for medical care moving forward.  Patient acknowledges that today is a "better day" because they have slightly increased her diet and her pain is well-managed.  Discussed can continue with appropriate medical management including appropriate symptom management versus the overall transition to comfort focused care.  After discussion patient elected that she did not want to transition to comfort focused care at this time and wanted to continue appropriate medical interventions.  Discussed we can continue appropriate symptom management to so that patient is not visible in the bed which is 1 of patient's main concerns at this point.  Patient did receive a dose of IV Dilaudid earlier in the day which she felt greatly helped her pain and is now a part of making her day "better".  Discussed can reevaluate daily to see  how patient is feeling and should she have another setback, can have further discussion about what is most important to her and what she does and does not wish to continue.  Patient agreeing with this plan.  All questions answered.   Final plan to continue current level of medical management with appropriate symptom management as well.  Updated IDT including hospitalist and RN.  Review of Systems Leg pain improved Objective:   Vital Signs:  BP (!) 129/50   Pulse 74   Temp 98.5 F (36.9 C) (Axillary)   Resp (!) 22   Ht 5\' 5"  (1.651 m)   Wt 59.9 kg   SpO2 95%   BMI 21.98 kg/m   Physical Exam: General: NAD, alert, frail HENT: NG tube in place Cardiovascular: RRR Respiratory: no increased work of breathing noted, not in respiratory distress Neuro: A&Ox4, following commands easily Psych: appropriately answers all questions  Imaging: I personally reviewed recent imaging.   Assessment & Plan:   Assessment: Patient is an 82 yo female with a PMHx of Afib, RLS, HTN, cervical cancer, and anemia who was admitted on 09/27/23 for management abdominal pain. During hospitalization, patient was found to have a bowel obstruction for which she underwent Hartman's procedure and small bowel resection with surgical team on 10/21.  Prolonged hospital course complicated by anastomosis leak requiring abx coverage and IR drain placement, continued NG tube management, and deconditioning.    Palliative medicine team consulted to assist with complex medical decision making.   Recommendations/Plan: # Complex medical decision making/goals of care:  - Extensive discussion with patient initially then with patient with her granddaughter, Elizabeth Odom, present at bedside.  Discussed possible pathways for medical care moving forward including continuing with appropriate medical interventions versus transition to comfort focused care.  Patient had been voicing concerns that she did not want further interventions.  When took time to explain what discontinuation of these interventions would mean and that patient would likely die because of this, patient noted she wants to continue with current level of medical interventions.  Noted would continue to  follow along so could address as needed so if patient feels that there is ever a point where medical event interventions are no longer improving her quality of life, can then transition to comfort focused care.  Patient's primary concern is feeling "miserable" while stuck in the bed.  Discussed appropriate symptom management in the setting of this.  -  Code Status: Limited: Do not attempt resuscitation (DNR) -DNR-LIMITED -Do Not Intubate/DNI   # Symptom management:  -Pain, in setting of recent bowel obstruction requiring continued NG tube management and colostomy Reported good response of pain to IV Dilaudid 0.5 mg dose received this morning.  Patient and family noting she will not ask for pain so agreed with the pain medication being scheduled.  Pain medication can always be refused and should be held if patient is lethargic or respiratory rate is <12.   -Start IV dilaudid 0.5mg  q 3 hours scheduled (based on how long initial dose lasted). Should be held for sedation or RR < 12.   -Change IV dilaudid to 1mg  q2 hrs prn breakthrough after scheduled dilaudid.   # Psychosocial Support:  -Granddaughter- Elizabeth Odom (reported HCPOA)  # Discharge Planning: To Be Determined  Discussed with: patient, patient's granddaughter Elizabeth Odom), RN, hospitalist   Thank you for allowing the palliative care team to participate in the care [name].  Alvester Morin, DO Palliative Care Provider PMT # [phone]  If patient  remains symptomatic despite maximum doses, please call PMT at [phone] between 0700 and 1900. Outside of these hours, please call attending, as PMT does not have night coverage.  *Please note that this is a verbal dictation therefore any spelling or grammatical errors are due to the "[employer]agon [employer] One" system interpretation.

## 2023-10-16 NOTE — Progress Notes (Addendum)
Patient informed this nurse that she would like all measures stopped and she wants to die. Patient stated she was uncomfortable and is ready to be with her husband. This nurse informed the patient that we would like to control her discomfort but to discuss her goals of care with the primary provider/palliative and family members. Patient was in agreeance with this plan.   Reached out to Dr. Blake Divine about patient's wishes.

## 2023-10-16 NOTE — Consult Note (Signed)
WOC Nurse ostomy follow up; remains in ICU setting with NG tube in place  Stoma type/location: LLQ colostomy  Stomal assessment/size:  1 1/4" above skin level, red moist  Peristomal assessment: mild erythema  Treatment options for stomal/peristomal skin: no sting barrier wipe 2" barrier ring  Output minimal output in current pouch Ostomy pouching: changed to 2 piece 2 1/4" pouch today as patient is requiring daily suppositories  Education provided:  none, patient not ready to learn. See previous notes will likely require total assist with ostomy care.   Enrolled patient in [place] Secure Start Discharge program: Yes previously   2 1/4" skin barrier Hart [place] [phone]), 2 1/4" pouch Hart [place] [phone]) and 2" barrier ring [place] [phone])   WOC team will continue to follow weekly for ostomy support.   Thank you,    Priscella Mann MSN, RN-BC, [employer] [phone]

## 2023-10-17 DIAGNOSIS — D500 Iron deficiency anemia secondary to blood loss (chronic): Secondary | ICD-10-CM

## 2023-10-17 DIAGNOSIS — R52 Pain, unspecified: Secondary | ICD-10-CM | POA: Diagnosis not present

## 2023-10-17 DIAGNOSIS — E440 Moderate protein-calorie malnutrition: Secondary | ICD-10-CM | POA: Insufficient documentation

## 2023-10-17 DIAGNOSIS — Z515 Encounter for palliative care: Secondary | ICD-10-CM | POA: Diagnosis not present

## 2023-10-17 DIAGNOSIS — R1084 Generalized abdominal pain: Secondary | ICD-10-CM | POA: Diagnosis not present

## 2023-10-17 DIAGNOSIS — K56609 Unspecified intestinal obstruction, unspecified as to partial versus complete obstruction: Secondary | ICD-10-CM | POA: Diagnosis not present

## 2023-10-17 DIAGNOSIS — Z7189 Other specified counseling: Secondary | ICD-10-CM | POA: Diagnosis not present

## 2023-10-17 DIAGNOSIS — K529 Noninfective gastroenteritis and colitis, unspecified: Secondary | ICD-10-CM | POA: Diagnosis not present

## 2023-10-17 LAB — CBC WITH DIFFERENTIAL/PLATELET
Abs Immature Granulocytes: 0.27 10*3/uL — ABNORMAL HIGH (ref 0.00–0.07)
Basophils Absolute: 0.1 10*3/uL (ref 0.0–0.1)
Basophils Relative: 1 %
Eosinophils Absolute: 0.4 10*3/uL (ref 0.0–0.5)
Eosinophils Relative: 4 %
HCT: 30.7 % — ABNORMAL LOW (ref 36.0–46.0)
Hemoglobin: 9.9 g/dL — ABNORMAL LOW (ref 12.0–15.0)
Immature Granulocytes: 3 %
Lymphocytes Relative: 12 %
Lymphs Abs: 1 10*3/uL (ref 0.7–4.0)
MCH: 30.9 pg (ref 26.0–34.0)
MCHC: 32.2 g/dL (ref 30.0–36.0)
MCV: 95.9 fL (ref 80.0–100.0)
Monocytes Absolute: 0.9 10*3/uL (ref 0.1–1.0)
Monocytes Relative: 11 %
Neutro Abs: 5.7 10*3/uL (ref 1.7–7.7)
Neutrophils Relative %: 69 %
Platelets: 290 10*3/uL (ref 150–400)
RBC: 3.2 MIL/uL — ABNORMAL LOW (ref 3.87–5.11)
RDW: 15.2 % (ref 11.5–15.5)
WBC: 8.3 10*3/uL (ref 4.0–10.5)
nRBC: 0 % (ref 0.0–0.2)

## 2023-10-17 LAB — GLUCOSE, CAPILLARY
Glucose-Capillary: 108 mg/dL — ABNORMAL HIGH (ref 70–99)
Glucose-Capillary: 122 mg/dL — ABNORMAL HIGH (ref 70–99)
Glucose-Capillary: 127 mg/dL — ABNORMAL HIGH (ref 70–99)
Glucose-Capillary: 136 mg/dL — ABNORMAL HIGH (ref 70–99)

## 2023-10-17 LAB — MAGNESIUM: Magnesium: 1.9 mg/dL (ref 1.7–2.4)

## 2023-10-17 LAB — COMPREHENSIVE METABOLIC PANEL
ALT: 44 U/L (ref 0–44)
AST: 42 U/L — ABNORMAL HIGH (ref 15–41)
Albumin: 2.1 g/dL — ABNORMAL LOW (ref 3.5–5.0)
Alkaline Phosphatase: 108 U/L (ref 38–126)
Anion gap: 5 (ref 5–15)
BUN: 17 mg/dL (ref 8–23)
CO2: 21 mmol/L — ABNORMAL LOW (ref 22–32)
Calcium: 7.9 mg/dL — ABNORMAL LOW (ref 8.9–10.3)
Chloride: 104 mmol/L (ref 98–111)
Creatinine, Ser: 0.56 mg/dL (ref 0.44–1.00)
GFR, Estimated: >60 mL/min (ref >60)
Glucose, Bld: 133 mg/dL — ABNORMAL HIGH (ref 70–99)
Potassium: 3.6 mmol/L (ref 3.5–5.1)
Sodium: 130 mmol/L — ABNORMAL LOW (ref 135–145)
Total Bilirubin: 0.6 mg/dL (ref <1.2)
Total Protein: 5.5 g/dL — ABNORMAL LOW (ref 6.5–8.1)

## 2023-10-17 LAB — PHOSPHORUS: Phosphorus: 2.1 mg/dL — ABNORMAL LOW (ref 2.5–4.6)

## 2023-10-17 MED ORDER — SODIUM PHOSPHATES 45 MMOLE/15ML IV SOLN
30.0000 mmol | Freq: Once | INTRAVENOUS | Status: AC
  Administered 2023-10-17: 30 mmol via INTRAVENOUS
  Filled 2023-10-17: qty 10

## 2023-10-17 MED ORDER — HYDROMORPHONE HCL 1 MG/ML IJ SOLN
0.5000 mg | INTRAMUSCULAR | Status: DC | PRN
  Administered 2023-10-17 – 2023-10-24 (×27): 1 mg via INTRAVENOUS
  Filled 2023-10-17 (×27): qty 1

## 2023-10-17 MED ORDER — POTASSIUM CHLORIDE 10 MEQ/50ML IV SOLN
10.0000 meq | INTRAVENOUS | Status: AC
  Administered 2023-10-17 (×4): 10 meq via INTRAVENOUS
  Filled 2023-10-17 (×4): qty 50

## 2023-10-17 MED ORDER — FAT EMUL FISH OIL/PLANT BASED 20% (SMOFLIPID)IV EMUL
250.0000 mL | INTRAVENOUS | Status: AC
  Administered 2023-10-17: 250 mL via INTRAVENOUS
  Filled 2023-10-17: qty 250

## 2023-10-17 MED ORDER — TRACE MINERALS CU-MN-SE-ZN 300-55-60-3000 MCG/ML IV SOLN
41.0000 mL/h | INTRAVENOUS | Status: AC
  Filled 2023-10-17: qty 1000

## 2023-10-17 MED ORDER — ALBUMIN HUMAN 25 % IV SOLN
12.5000 g | Freq: Once | INTRAVENOUS | Status: AC
  Administered 2023-10-17: 12.5 g via INTRAVENOUS
  Filled 2023-10-17: qty 50

## 2023-10-17 NOTE — Progress Notes (Addendum)
[facility]                                                                              Elizabeth Odom, is a 82 y.o. female, DOB - 18-Sep-1941, KGM:[id number] Admit date - 09/27/2023    Outpatient Primary MD for the patient is [name], FNP  LOS - 20  days  Chief Complaint  Patient presents with   Abdominal Pain       Brief summary   Patient is a 82 year old F with PMH of A-fib, RLS and anemia presented to ED on 10/17 with recurrent abdominal pain, nausea, emesis, diarrhea and intolerance of oral intake.  Patient was seen in ED on 10/10 for abdominal pain, decreased appetite and fever and discharged on Augmentin for possible colitis noted on CT abdomen and pelvis.  However, she had recurrence of symptoms after interval improvement and presented back to the hospital.   CT initially showed proximal sigmoid colon wall thickening with upstream colonic dilation with air-fluid levels and large amount of stool compatible with obstruction.  There is also a 9 mm cystic lesion in the tail of pancreas, and subcentimeter lucent lesion in L1 vertebral body.  GI consulted, patient refused colonoscopy but agreed to sigmoidoscopy showing stricture versus mass at the rectosigmoid region -scope could not be advanced past this position.  Surgery was consulted -status post ex lap with small bowel resection and Hartman's resection and colostomy on 10/01/2023.  Patient continues to have postop ileus, managed by general surgery.  NG remains in place to suction.   Hospital course complicated by acute PE and LLE DVT for which she was started on heparin. She developed profound acute blood loss anemia with subsequent pelvic hematoma near the small bowel anastomosis as well as notable hematuria.  Urology was consulted, signed off after resolution of hematuria. Given high risk of bleed with notable PE/DVT, evaluated by IR and placed IVC filter on 10/05/2023.  Patient required total of 4 unit of  blood transfusion.   Patient continues to work with PT OT, patient will likely require placement given her prolonged hospitalization, weakness, and diminished ambulatory status from baseline.   As her bowel function did not return, she underwent a repeat CT abd and pelvis on 10/31 showing  anastomotic leak into 7 x 6 cm complex fluid collection posteriorly in the pelvis. IR was consulted and underwent CT guided percutaneous drain placement into pelvic abscess.  Fluid culture grew MRSA. ID consulted and she was transitioned to daptomycin and zosyn.    Assessment & Plan    Principal Problem:   Large bowel obstruction (HCC)  -CT abdomen 10/17 with proximal sigmoid colon thickening.  -GI consulted and underwent  sigmoidoscopy 10/19 with tight stricture - was found to have a mass biopsy negative for malignancy as suggesting inflammation.  -Surgery consulted, underwent ex lap hartman's resection on 10/21 with small bowel resection and ostomy ([name]).  - Repeat CT abd pelvis on 10/31 showed a small leak at her anastomosis going into a 6 x 7 cm fluid collection  - IR Consulted and underwent drainage catheter placement for pelvic abscess on 11/1.  -  Fluid culture grew MRSA, ID consulted and transitioned to Daptomycin and zosyn. Duration as per ID.  -Tolerated minimal clear yesterday, NG was clamped.  Per surgery, DC NG tube today -Continue TPN, PICC line     Post  operative ileus vs partial SBO - Exacerbated by intra abd bleeding.  -Tolerated minimal clears yesterday, NG tube was clamped. -Per surgery, DC NG today -Continue TPN    Acute blood loss anemia sec to pelvic/ peri anastomotic hematomas with Hematuria -S/p 4 units of prbc transfusions.  -S/p IVC filter,  -Urology recommended no indication for cystoscopy or intervention.  -H&H stable 9.9    Acute PE with right heart strain with acute left leg DVT Unable to tolerate anticoagulation status post IVC filter placement on 10/25       Nodule in the inf aspect of the isthmus Thyroid US - follow up outpatient.      Paroxysmal atrial fibrillation Rate controlled. -Not on anticoagulation    Hyponatremia:  Nutritional , monitor.    Hypokalemia and hypophosphatemia and hypomagnesemia Replace as needed   Urinary retention:  -UA negative for infection  Hypotension: resolved.     Leukocytosis  Resolved.    Pressure Injury Documentation: Pressure Injury 10/08/23 Buttocks Right;Mid Stage 2 -  Partial thickness loss of dermis presenting as a shallow open injury with a red, pink wound bed without slough. (Active)  10/08/23 0800  Location: Buttocks  Location Orientation: Right;Mid  Staging: Stage 2 -  Partial thickness loss of dermis presenting as a shallow open injury with a red, pink wound bed without slough.  Wound Description (Comments):   Present on Admission:   Dressing Type Foam - Lift dressing to assess site every shift 10/16/23 2100    Moderate protein calorie malnutrition Nutrition Problem: Moderate Malnutrition Etiology: chronic illness Signs/Symptoms: moderate fat depletion, severe muscle depletion Interventions: Refer to RD note for recommendations, TPN  Estimated body mass index is 22.89 kg/m as calculated from the following:   Height as of this encounter: 5\' 5"  (1.651 m).   Weight as of this encounter: 62.4 kg.  Code Status: DNR DVT Prophylaxis:  Place and maintain sequential compression device Start: 10/06/23 1319   Level of Care: Level of care: Stepdown Family Communication: Updated patient Disposition Plan:      Remains inpatient appropriate: Will need placement at discharge   Procedures:  10/21 s/p ex lap hartman's resection , small bowel resection and ostomy.  IVC filter placement  Consultants:   IR General Surgery Infectious disease  Antimicrobials:   Anti-infectives (From admission, onward)    Start     Dose/Rate Route Frequency Ordered Stop   10/15/23 1800   vancomycin (VANCOCIN) IVPB 1000 mg/200 mL premix  Status:  Discontinued        1,000 mg 200 mL/hr over 60 Minutes Intravenous Every 24 hours 10/14/23 1740 10/15/23 1045   10/15/23 1430  piperacillin-tazobactam (ZOSYN) IVPB 3.375 g        3.375 g 100 mL/hr over 30 Minutes Intravenous  Once 10/15/23 1343 10/15/23 1503   10/15/23 1400  DAPTOmycin (CUBICIN) IVPB 500 mg/11mL premix        8 mg/kg  64.1 kg 100 mL/hr over 30 Minutes Intravenous Daily 10/15/23 1045     10/14/23 1800  vancomycin (VANCOREADY) IVPB 1250 mg/250 mL        1,250 mg 166.7 mL/hr over 90 Minutes Intravenous  Once 10/14/23 1740 10/14/23 2255   10/14/23 1400  piperacillin-tazobactam (ZOSYN) IVPB 3.375 g  3.375 g 12.5 mL/hr over 240 Minutes Intravenous Every 8 hours 10/14/23 0845     10/14/23 0900  piperacillin-tazobactam (ZOSYN) IVPB 3.375 g        3.375 g 100 mL/hr over 30 Minutes Intravenous NOW 10/14/23 0844 10/14/23 1002   10/05/23 1200  piperacillin-tazobactam (ZOSYN) IVPB 3.375 g        3.375 g 12.5 mL/hr over 240 Minutes Intravenous Every 8 hours 10/05/23 0721 10/09/23 1716   10/04/23 2000  piperacillin-tazobactam (ZOSYN) IVPB 3.375 g  Status:  Discontinued        3.375 g 12.5 mL/hr over 240 Minutes Intravenous Every 8 hours 10/04/23 1827 10/05/23 0721   09/27/23 2315  piperacillin-tazobactam (ZOSYN) IVPB 3.375 g  Status:  Discontinued        3.375 g 12.5 mL/hr over 240 Minutes Intravenous Every 8 hours 09/27/23 2305 10/02/23 1106          Medications  bisacodyl  10 mg Rectal Daily   Chlorhexidine Gluconate Cloth  6 each Topical Daily   insulin aspart  0-9 Units Subcutaneous Q8H   ketotifen  2 drop Both Eyes BID   sodium chloride flush  10-40 mL Intracatheter Q12H   sodium chloride flush  5 mL Intracatheter Q8H   sodium hypochlorite   Irrigation BID   thiamine (VITAMIN B1) injection  100 mg Intravenous Daily      Subjective:   Elizabeth Odom was seen and examined today.  Tolerated minimal  clears yesterday, still has NG, alert and awake.  Overnight no acute issues.  No fevers.   Objective:   Vitals:   10/17/23 0400 10/17/23 0500 10/17/23 0600 10/17/23 0800  BP: (!) 109/55 (!) 109/46 (!) 112/50   Pulse: 70 68 70   Resp: (!) 22 (!) 21 17   Temp: 97.9 F (36.6 C)   (!) 97.3 F (36.3 C)  TempSrc: Oral   Oral  SpO2: 98% 97% 98%   Weight: 62.4 kg     Height:        Intake/Output Summary (Last 24 hours) at 10/17/2023 1037 Last data filed at 10/17/2023 1610 Gross per 24 hour  Intake 3601.05 ml  Output 2640 ml  Net 961.05 ml     Wt Readings from Last 3 Encounters:  10/17/23 62.4 kg  09/20/23 56.2 kg  11/23/20 55.3 kg     Exam General: Alert and oriented, ill-appearing and debilitated Cardiovascular: S1 S2 auscultated,  RRR Respiratory: Clear to auscultation bilaterally, no wheezing Gastrointestinal: Dressing intact, liquid stool in ostomy, pelvic drain in place.   Ext: no pedal edema bilaterally Neuro: no new deficits Psych: appears anxious    Data Reviewed:  I have personally reviewed following labs    CBC Lab Results  Component Value Date   WBC 8.3 10/17/2023   RBC 3.20 (L) 10/17/2023   HGB 9.9 (L) 10/17/2023   HCT 30.7 (L) 10/17/2023   MCV 95.9 10/17/2023   MCH 30.9 10/17/2023   PLT 290 10/17/2023   MCHC 32.2 10/17/2023   RDW 15.2 10/17/2023   LYMPHSABS 1.0 10/17/2023   MONOABS 0.9 10/17/2023   EOSABS 0.4 10/17/2023   BASOSABS 0.1 10/17/2023     Last metabolic panel Lab Results  Component Value Date   NA 130 (L) 10/17/2023   K 3.6 10/17/2023   CL 104 10/17/2023   CO2 21 (L) 10/17/2023   BUN 17 10/17/2023   CREATININE 0.56 10/17/2023   GLUCOSE 133 (H) 10/17/2023   GFRNONAA >60 10/17/2023   GFRAA >60  04/10/2016   CALCIUM 7.9 (L) 10/17/2023   PHOS 2.1 (L) 10/17/2023   PROT 5.5 (L) 10/17/2023   ALBUMIN 2.1 (L) 10/17/2023   BILITOT 0.6 10/17/2023   ALKPHOS 108 10/17/2023   AST 42 (H) 10/17/2023   ALT 44 10/17/2023   ANIONGAP 5  10/17/2023    CBG (last 3)  Recent Labs    10/16/23 1535 10/17/23 0019 10/17/23 0730  GLUCAP 119* 127* 136*      Coagulation Profile: Recent Labs  Lab 10/11/23 1255 10/12/23 0655  INR 1.1 1.1     Radiology Studies: I have personally reviewed the imaging studies  No results found.     Thad Ranger M.D. [facility] 10/17/2023, 10:37 AM  Available via Epic secure chat 7am-7pm After 7 pm, please refer to night coverage provider listed on amion.

## 2023-10-17 NOTE — Progress Notes (Signed)
PHARMACY - TOTAL PARENTERAL NUTRITION CONSULT NOTE   Indication:  Prolonged ileus  Patient Measurements: Height: 5\' 5"  (165.1 cm) Weight: 62.4 kg (137 lb 9.1 oz) IBW/kg (Calculated) : 57 TPN AdjBW (KG): 62 Body mass index is 22.89 kg/m. Usual Weight: 64.5 on 10/04/2023  Assessment:  TPN per Pharmacy consult for this 82 yo female admitted on 09/27/2023 with recurrent abdominal pain, nausea, emesis, diarrhea and intolerance of oral intake. Surgery completed exp lap with small bowel resection and Hartman's resection and colostomy on 10/01/2023.  Patient continues to have postop ileus, managed by general surgery.  NG remains in place to suction. 11/31 imaging showed anastomic leak. 11/1 IR drain catheter placement into pelvic abscess.  Glucose / Insulin: No PMH of DM.  Goal CBGs < 150. 24 hr glucose range: 119-144 24 hr units of insulin aspart usage: 2 units Electrolytes: Na remains low, Phos low/improved, K low/improved, C02 low.  CorrCa 9.4 - Goal K >= 4, mag >=2, phos ~3 - Unable to custom correct electrolytes in TPN, but will continue to supplement in and outside of TPN. Renal: SCr <1, BUN WNL Hepatic: Alk phos WNL, AST/ALT WNL, Total Bilirubin WNL Intake / Output; MIVF: Net I/O + Drains 20 mL, NG 400 mL, Urine 2725 mL, Stool 50 mL (stool occurrence x1) mIVF: D10 @ 40 ml/hr  GI Imaging: 10/10 CT A/P: small bowel enteritis/colitis that was also involving the sigmoid colon. No bowel obstruction seen.  10/17 CT A/P: wall thickening sigmoid colon worrisome for neoplasm. Focal colitis not excluded. Colonic dilation compatible with obstruction.  Cystic mass at the tail of the pancreas.  10/18 MR - 9 mm cystic lesion within pancreatic body/tail junction, colonic obstruction 10/31 CT A/P - colostomy and Hartmann's pouch seen.  Appears to be extravasation of contrast suggesting perforation or leakage. GI Surgeries / Procedures:  10/19 flexible sigmoidoscopy 10/21 colectomy with  colostomy creation/Hartman procedure 11/1: CT guided visceral fluid drain by perc cath  Central access: PICC line placed 11/1 TPN start date: 11/1  Nutritional Goals: Due to severe national shortage of IV fluids related to [facility], pharmacy will utilize best available premix TPN formula to meet at least 80% of protein and 75% of kcal goals  TPN:  Clinimix 8/10, 1L x5 days per week and 2L x2 days per week with daily lipids ( ) to provide 103 g protein and an average of 1353 kcal per day D10 at 40 ml/hr provides an additional 326 kcal  Average daily total:  103 g of protein (100% of goal), 1679 kcals (88% of goal)   RD Assessment:  Estimated Needs Total Energy Estimated Needs: 1900-2100 Total Protein Estimated Needs: 90-105g Total Fluid Estimated Needs: 2L/day  Current Nutrition:  Clear liquids  - NG tube clamped 11/5, plan to remove 11/6 TPN  Plan:  Now:  NaPhos 30 mmol IV x1 KCl 10 mEq IV x4 runs   At 1800: Clinimix 8/10 TPN with electrolytes Alternate 2000 mL twice weekly (Monday/Thursday), 1000 mL other days Electrolytes in TPN: Na 35 mEq/L, K 30 mEq/L, Ca 4.5 mEq/L, Mg 5 mEq/L, and Phos 15 mmol/L. Cl:Ac 1:1 Add standard MVI and trace elements to TPN, no chromium due to shortage SMOF Lipid 20% over 12 hours Dextrose 10% IV at 40 ml/hr  Thiamine 100mg  IV daily x 5 days (11/4-11/8) Continue Sensitive SSI q8h and adjust as needed Monitor TPN labs on Mon/Thurs and PRN  Follow up ability to tolerate enteral diet and wean TPN when  intake is >60% of nutritional needs.    [name] PharmD, BCPS [facility] main pharmacy [phone] 10/17/2023 9:50 AM

## 2023-10-17 NOTE — Progress Notes (Signed)
       Overnight   NAME: ASHANTEE DEUPREE MRN: [id number] DOB : 1941-02-09    Date of Service   10/17/2023   HPI/Events of Note    Notified for borderline hypOtension.  Previous value known to be :  Latest Reference Range & Units 10/15/23 07:23  Albumin 3.5 - 5.0 g/dL 2.5 (L)  (L): Data is abnormally low    Interventions/ Plan   Albumin ordered Continue all previous orders       Update    0600 hrs  BP 112/50 HR 70 RR 17    Chinita Greenland BSN MSNA MSN ACNPC-AG Acute Care Nurse Practitioner [facility] [place]

## 2023-10-17 NOTE — Progress Notes (Signed)
16 Days Post-Op   Subjective/Chief Complaint: More alert today No nausea NG clamped for almost 24 hours Stool in bag Tolerating minimal clears yesterday   Objective: Vital signs in last 24 hours: Temp:  [97.3 F (36.3 C)-98.7 F (37.1 C)] 97.3 F (36.3 C) (11/06 0800) Pulse Rate:  [47-77] 70 (11/06 0600) Resp:  [17-27] 17 (11/06 0600) BP: (86-120)/(26-82) 112/50 (11/06 0600) SpO2:  [95 %-99 %] 98 % (11/06 0600) Weight:  [62.4 kg] 62.4 kg (11/06 0400) Last BM Date : 10/16/23  Intake/Output from previous day: 11/05 0701 - 11/06 0700 In: 3651.1 [P.O.:297; I.V.:2500.3; NG/GT:10; IV Piggyback:833.7] Out: 3195 [Urine:2725; Emesis/NG output:400; Drains:20; Stool:50] Intake/Output this shift: No intake/output data recorded.  Thin elderly female in NAD Mildly confused Abd - midline wound clean with minimal granulation tissue; bluish tint to drainage JP minimal reddish brown output  Lab Results:  Recent Labs    10/16/23 0846 10/17/23 0815  WBC 8.7 8.3  HGB 10.0* 9.9*  HCT 31.3* 30.7*  PLT 309 290   BMET Recent Labs    10/16/23 0846 10/17/23 0815  NA 132* 130*  K 3.2* 3.6  CL 104 104  CO2 21* 21*  GLUCOSE 135* 133*  BUN 21 17  CREATININE 0.31* 0.56  CALCIUM 7.7* 7.9*   PT/INR No results for input(s): "LABPROT", "INR" in the last 72 hours. ABG No results for input(s): "PHART", "HCO3" in the last 72 hours.  Invalid input(s): "PCO2", "PO2"  Studies/Results: No results found.  Anti-infectives: Anti-infectives (From admission, onward)    Start     Dose/Rate Route Frequency Ordered Stop   10/15/23 1800  vancomycin (VANCOCIN) IVPB 1000 mg/200 mL premix  Status:  Discontinued        1,000 mg 200 mL/hr over 60 Minutes Intravenous Every 24 hours 10/14/23 1740 10/15/23 1045   10/15/23 1430  piperacillin-tazobactam (ZOSYN) IVPB 3.375 g        3.375 g 100 mL/hr over 30 Minutes Intravenous  Once 10/15/23 1343 10/15/23 1503   10/15/23 1400  DAPTOmycin (CUBICIN)  IVPB 500 mg/28mL premix        8 mg/kg  64.1 kg 100 mL/hr over 30 Minutes Intravenous Daily 10/15/23 1045     10/14/23 1800  vancomycin (VANCOREADY) IVPB 1250 mg/250 mL        1,250 mg 166.7 mL/hr over 90 Minutes Intravenous  Once 10/14/23 1740 10/14/23 2255   10/14/23 1400  piperacillin-tazobactam (ZOSYN) IVPB 3.375 g        3.375 g 12.5 mL/hr over 240 Minutes Intravenous Every 8 hours 10/14/23 0845     10/14/23 0900  piperacillin-tazobactam (ZOSYN) IVPB 3.375 g        3.375 g 100 mL/hr over 30 Minutes Intravenous NOW 10/14/23 0844 10/14/23 1002   10/05/23 1200  piperacillin-tazobactam (ZOSYN) IVPB 3.375 g        3.375 g 12.5 mL/hr over 240 Minutes Intravenous Every 8 hours 10/05/23 0721 10/09/23 1716   10/04/23 2000  piperacillin-tazobactam (ZOSYN) IVPB 3.375 g  Status:  Discontinued        3.375 g 12.5 mL/hr over 240 Minutes Intravenous Every 8 hours 10/04/23 1827 10/05/23 0721   09/27/23 2315  piperacillin-tazobactam (ZOSYN) IVPB 3.375 g  Status:  Discontinued        3.375 g 12.5 mL/hr over 240 Minutes Intravenous Every 8 hours 09/27/23 2305 10/02/23 1106       Assessment/Plan: POD16 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 [name] - CT 10/24 with  hematoma in the pelvis adjacent to small bowel anastomosis and ileus vs obstruction  - IR 11/1: drain placement for small bowel anastomotic leak/ abscess drainage - PICC and TPN.  - beginning to have bowel function with colostomy output.  - D/C NG tube - drain output 20/24h (65 ml/24h prior 24h) - Drain aspirate growing Staph aureus, abx per ID - WBC normal       FEN: PICC/TPN; clear liquids today VTE: IVC Filter  ID: no current abx   - per TRH -  ABL anemia secondary to post-op hematomas and hematuria  Acute PE and LLE DVT Pancreatic tail cyst Thyroid nodule  Chronic HFpEF Paroxysmal A. Fib Pre-diabetes RLS  LOS: 20 days    Wynona Luna 10/17/2023

## 2023-10-17 NOTE — Progress Notes (Signed)
Daily Progress Note   Patient Name: Elizabeth Odom       Date: 10/17/2023 DOB: [birth year]  Age: 82 y.o. MRN#: [id number] Attending Physician: Cathren Harsh, MD Primary Care Physician: [name], FNP Admit Date: 09/27/2023 Length of Stay: 20 days  Reason for Consultation/Follow-up: Establishing goals of care  Subjective:   CC: Patient notes wanting to eat more [place] ice today. Following up regarding complex medical decision making.   Subjective:  Reviewed EMR prior to presenting to bedside.  Discussed care with bedside RN for updates.  Patient did receive pain medication and Ativan last evening.  Reports of patient sleeping well.  Presented to bedside to see patient.  No family present at bedside during visit.  Inquired if surgery team had been by to visit patient this morning and she believes they had.  She and they discussed potentially starting broth and at some point getting this NG tube out.  Patient is worried about having further nausea though discussed symptoms can be managed and NG tube can be replaced if needed.  Defer to surgical team about plan for removal. Today patient notes she is not feeling worse and does name things that are moving in the appropriate direction.  Discussed continue pain medications as needed and's she knows they can benefit her and she can ask for them.  Patient's primary concern is that she to eat some more [place] ice today as she finds that very tasty.  Patient noting continuing with current medical therapies.  All questions answered at that time.  Noted palliative medicine team will continue to follow with patient's medical journey.  Objective:   Vital Signs:  BP (!) 112/50   Pulse 70   Temp (!) 97.3 F (36.3 C) (Oral)   Resp 17   Ht 5\' 5"  (1.651 m)   Wt 62.4 kg   SpO2 98%   BMI 22.89 kg/m   Physical Exam: General: NAD, alert, frail HENT: NG tube in place Cardiovascular: RRR Respiratory: no increased work of breathing noted, not  in respiratory distress Neuro: A&Ox4, following commands easily Psych: appropriately answers all questions  Imaging: I personally reviewed recent imaging.   Assessment & Plan:   Assessment: Patient is an 82 yo female with a PMHx of Afib, RLS, HTN, cervical cancer, and anemia who was admitted on 09/27/23 for management abdominal pain. During hospitalization, patient was found to have a bowel obstruction for which she underwent Hartman's procedure and small bowel resection with surgical team on 10/21.  Prolonged hospital course complicated by anastomosis leak requiring abx coverage and IR drain placement, continued NG tube management, and deconditioning.    Palliative medicine team consulted to assist with complex medical decision making.   Recommendations/Plan: # Complex medical decision making/goals of care:  - Extensive discussion with patient and granddaughter at bedside on 11/5 to discuss possible pathways for medical care moving forward including continuing with appropriate medical interventions versus transition to comfort focused care since patient has been voicing desire to discontinue interventions.  After discussion, patient electing to continue with current medical interventions.  Did explain that goals for medical care could continue to be addressed if patient feels that there is ever a point where medical event interventions are no longer improving her quality of life, can then discuss transition to comfort focused care.  -  Code Status: Limited: Do not attempt resuscitation (DNR) -DNR-LIMITED -Do Not Intubate/DNI   # Symptom management:  -Pain, in setting of recent bowel obstruction requiring continued  NG tube management and colostomy   -Change IV dilaudid to 0.5-1mg  q2 hrs prn    -Discontinued scheduled dilaudid  # Psychosocial Support:  -Granddaughter- [name] (reported HCPOA)  # Discharge Planning: To Be Determined  Discussed with: patient, patient's granddaughter [name]), RN,  hospitalist   Thank you for allowing the palliative care team to participate in the care [name].  Alvester Morin, DO Palliative Care Provider PMT # [phone]  If patient remains symptomatic despite maximum doses, please call PMT at [phone] between 0700 and 1900. Outside of these hours, please call attending, as PMT does not have night coverage.  *Please note that this is a verbal dictation therefore any spelling or grammatical errors are due to the "[employer]" system interpretation.

## 2023-10-18 DIAGNOSIS — K529 Noninfective gastroenteritis and colitis, unspecified: Secondary | ICD-10-CM | POA: Diagnosis not present

## 2023-10-18 DIAGNOSIS — R1084 Generalized abdominal pain: Secondary | ICD-10-CM | POA: Diagnosis not present

## 2023-10-18 DIAGNOSIS — D500 Iron deficiency anemia secondary to blood loss (chronic): Secondary | ICD-10-CM | POA: Diagnosis not present

## 2023-10-18 DIAGNOSIS — K56609 Unspecified intestinal obstruction, unspecified as to partial versus complete obstruction: Secondary | ICD-10-CM | POA: Diagnosis not present

## 2023-10-18 LAB — GLUCOSE, CAPILLARY
Glucose-Capillary: 130 mg/dL — ABNORMAL HIGH (ref 70–99)
Glucose-Capillary: 117 mg/dL — ABNORMAL HIGH (ref 70–99)
Glucose-Capillary: 131 mg/dL — ABNORMAL HIGH (ref 70–99)

## 2023-10-18 LAB — MAGNESIUM: Magnesium: 1.8 mg/dL (ref 1.7–2.4)

## 2023-10-18 LAB — COMPREHENSIVE METABOLIC PANEL
ALT: 49 U/L — ABNORMAL HIGH (ref 0–44)
AST: 45 U/L — ABNORMAL HIGH (ref 15–41)
Albumin: 2.2 g/dL — ABNORMAL LOW (ref 3.5–5.0)
Alkaline Phosphatase: 148 U/L — ABNORMAL HIGH (ref 38–126)
Anion gap: 6 (ref 5–15)
BUN: 14 mg/dL (ref 8–23)
CO2: 22 mmol/L (ref 22–32)
Calcium: 8 mg/dL — ABNORMAL LOW (ref 8.9–10.3)
Chloride: 100 mmol/L (ref 98–111)
Creatinine, Ser: 0.43 mg/dL — ABNORMAL LOW (ref 0.44–1.00)
GFR, Estimated: >60 mL/min (ref >60)
Glucose, Bld: 123 mg/dL — ABNORMAL HIGH (ref 70–99)
Potassium: 3.9 mmol/L (ref 3.5–5.1)
Sodium: 128 mmol/L — ABNORMAL LOW (ref 135–145)
Total Bilirubin: 0.7 mg/dL (ref <1.2)
Total Protein: 6.1 g/dL — ABNORMAL LOW (ref 6.5–8.1)

## 2023-10-18 LAB — MISC LABCORP TEST (SEND OUT): Labcorp test code: 96388

## 2023-10-18 LAB — PHOSPHORUS: Phosphorus: 2.3 mg/dL — ABNORMAL LOW (ref 2.5–4.6)

## 2023-10-18 MED ORDER — MAGNESIUM SULFATE 2 GM/50ML IV SOLN
2.0000 g | Freq: Once | INTRAVENOUS | Status: AC
  Administered 2023-10-18: 2 g via INTRAVENOUS
  Filled 2023-10-18: qty 50

## 2023-10-18 MED ORDER — POTASSIUM PHOSPHATES 15 MMOLE/5ML IV SOLN
30.0000 mmol | Freq: Once | INTRAVENOUS | Status: AC
  Administered 2023-10-18: 30 mmol via INTRAVENOUS
  Filled 2023-10-18: qty 10

## 2023-10-18 MED ORDER — TRACE MINERALS CU-MN-SE-ZN 300-55-60-3000 MCG/ML IV SOLN
INTRAVENOUS | Status: AC
  Filled 2023-10-18: qty 2000

## 2023-10-18 MED ORDER — SODIUM CHLORIDE 0.9 % IV BOLUS
500.0000 mL | Freq: Once | INTRAVENOUS | Status: AC
  Administered 2023-10-18: 500 mL via INTRAVENOUS

## 2023-10-18 MED ORDER — FAT EMUL FISH OIL/PLANT BASED 20% (SMOFLIPID)IV EMUL
250.0000 mL | INTRAVENOUS | Status: AC
  Administered 2023-10-18: 250 mL via INTRAVENOUS
  Filled 2023-10-18: qty 250

## 2023-10-18 MED ORDER — SODIUM CHLORIDE 1 G PO TABS
1.0000 g | ORAL_TABLET | Freq: Three times a day (TID) | ORAL | Status: DC
  Administered 2023-10-18: 1 g via ORAL
  Filled 2023-10-18 (×7): qty 1

## 2023-10-18 NOTE — Progress Notes (Signed)
[facility]                                                                              Elizabeth Odom, is a 82 y.o. female, DOB - [birth year], YQM:[id number] Admit date - 09/27/2023    Outpatient Primary MD for the patient is [name], FNP  LOS - 21  days  Chief Complaint  Patient presents with   Abdominal Pain       Brief summary   Patient is a 82 year old F with PMH of A-fib, RLS and anemia presented to ED on 10/17 with recurrent abdominal pain, nausea, emesis, diarrhea and intolerance of oral intake.  Patient was seen in ED on 10/10 for abdominal pain, decreased appetite and fever and discharged on Augmentin for possible colitis noted on CT abdomen and pelvis.  However, she had recurrence of symptoms after interval improvement and presented back to the hospital.   CT initially showed proximal sigmoid colon wall thickening with upstream colonic dilation with air-fluid levels and large amount of stool compatible with obstruction.  There is also a 9 mm cystic lesion in the tail of pancreas, and subcentimeter lucent lesion in L1 vertebral body.  GI consulted, patient refused colonoscopy but agreed to sigmoidoscopy showing stricture versus mass at the rectosigmoid region -scope could not be advanced past this position.  Surgery was consulted -status post ex lap with small bowel resection and Hartman's resection and colostomy on 10/01/2023.  Patient continues to have postop ileus, managed by general surgery.  NG remains in place to suction.   Hospital course complicated by acute PE and LLE DVT for which she was started on heparin. She developed profound acute blood loss anemia with subsequent pelvic hematoma near the small bowel anastomosis as well as notable hematuria.  Urology was consulted, signed off after resolution of hematuria. Given high risk of bleed with notable PE/DVT, evaluated by IR and placed IVC filter on 10/05/2023.  Patient required total of 4 unit of  blood transfusion.   Patient continues to work with PT OT, patient will likely require placement given her prolonged hospitalization, weakness, and diminished ambulatory status from baseline.   As her bowel function did not return, she underwent a repeat CT abd and pelvis on 10/31 showing  anastomotic leak into 7 x 6 cm complex fluid collection posteriorly in the pelvis. IR was consulted and underwent CT guided percutaneous drain placement into pelvic abscess.  Fluid culture grew MRSA. ID consulted and she was transitioned to daptomycin and zosyn.    Assessment & Plan    Principal Problem:   Large bowel obstruction (HCC)  -CT abdomen 10/17 with proximal sigmoid colon thickening.  -GI consulted and underwent  sigmoidoscopy 10/19 with tight stricture - was found to have a mass biopsy negative for malignancy as suggesting inflammation.  -Surgery consulted, underwent ex lap hartman's resection on 10/21 with small bowel resection and ostomy ([name]).  - Repeat CT abd pelvis on 10/31 showed a small leak at her anastomosis going into a 6 x 7 cm fluid collection  - IR Consulted and underwent drainage catheter placement for pelvic abscess on 11/1.  -  Fluid culture grew MRSA, ID consulted and transitioned to Daptomycin and zosyn. Duration as per ID.  -NG removed yesterday, feels a lot better today.  No abdominal pain, nausea or vomiting. -Not great p.o. intake, will continue TPN -General Surgery repeating CT scan to follow-up on the anastomotic leak, increased output     Post  operative ileus vs partial SBO - Exacerbated by intra abd bleeding.  --Continue TPN, repeat imaging  Hyponatremia -Sodium trending down, 128 <-130 on 11/6 -Increased output, negative balance of 4.7 L, will give 500 cc fluid bolus    Acute blood loss anemia sec to pelvic/ peri anastomotic hematomas with Hematuria -S/p 4 units of prbc transfusions.  -S/p IVC filter,  -Urology recommended no indication for cystoscopy  or intervention.  -H&H stable 9.9, repeat CBC in a.m.    Acute PE with right heart strain with acute left leg DVT Unable to tolerate anticoagulation status post IVC filter placement on 10/25      Nodule in the inf aspect of the isthmus Thyroid US - follow up outpatient.      Paroxysmal atrial fibrillation Rate controlled. -Not on anticoagulation    Hyponatremia:  Nutritional , monitor.    Hypokalemia and hypophosphatemia and hypomagnesemia Replace as needed   Urinary retention:  -UA negative for infection  Hypotension: resolved.     Leukocytosis  Resolved.    Pressure Injury Documentation: Pressure Injury 10/08/23 Buttocks Right;Mid Stage 2 -  Partial thickness loss of dermis presenting as a shallow open injury with a red, pink wound bed without slough. (Active)  10/08/23 0800  Location: Buttocks  Location Orientation: Right;Mid  Staging: Stage 2 -  Partial thickness loss of dermis presenting as a shallow open injury with a red, pink wound bed without slough.  Wound Description (Comments):   Present on Admission:   Dressing Type Foam - Lift dressing to assess site every shift 10/17/23 2200    Moderate protein calorie malnutrition Nutrition Problem: Moderate Malnutrition Etiology: chronic illness Signs/Symptoms: moderate fat depletion, severe muscle depletion Interventions: Refer to RD note for recommendations, TPN  Estimated body mass index is 22.75 kg/m as calculated from the following:   Height as of this encounter: 5\' 5"  (1.651 m).   Weight as of this encounter: 62 kg.  Code Status: DNR DVT Prophylaxis:  Place and maintain sequential compression device Start: 10/06/23 1319   Level of Care: Level of care: Stepdown Family Communication: Updated patient Disposition Plan:      Remains inpatient appropriate: Will need placement at discharge   Procedures:  10/21 s/p ex lap hartman's resection , small bowel resection and ostomy.  IVC filter  placement  Consultants:   IR General Surgery Infectious disease  Antimicrobials:   Anti-infectives (From admission, onward)    Start     Dose/Rate Route Frequency Ordered Stop   10/15/23 1800  vancomycin (VANCOCIN) IVPB 1000 mg/200 mL premix  Status:  Discontinued        1,000 mg 200 mL/hr over 60 Minutes Intravenous Every 24 hours 10/14/23 1740 10/15/23 1045   10/15/23 1430  piperacillin-tazobactam (ZOSYN) IVPB 3.375 g        3.375 g 100 mL/hr over 30 Minutes Intravenous  Once 10/15/23 1343 10/15/23 1503   10/15/23 1400  DAPTOmycin (CUBICIN) IVPB 500 mg/25mL premix        8 mg/kg  64.1 kg 100 mL/hr over 30 Minutes Intravenous Daily 10/15/23 1045     10/14/23 1800  vancomycin (VANCOREADY) IVPB 1250 mg/250 mL  1,250 mg 166.7 mL/hr over 90 Minutes Intravenous  Once 10/14/23 1740 10/14/23 2255   10/14/23 1400  piperacillin-tazobactam (ZOSYN) IVPB 3.375 g        3.375 g 12.5 mL/hr over 240 Minutes Intravenous Every 8 hours 10/14/23 0845     10/14/23 0900  piperacillin-tazobactam (ZOSYN) IVPB 3.375 g        3.375 g 100 mL/hr over 30 Minutes Intravenous NOW 10/14/23 0844 10/14/23 1002   10/05/23 1200  piperacillin-tazobactam (ZOSYN) IVPB 3.375 g        3.375 g 12.5 mL/hr over 240 Minutes Intravenous Every 8 hours 10/05/23 0721 10/09/23 1716   10/04/23 2000  piperacillin-tazobactam (ZOSYN) IVPB 3.375 g  Status:  Discontinued        3.375 g 12.5 mL/hr over 240 Minutes Intravenous Every 8 hours 10/04/23 1827 10/05/23 0721   09/27/23 2315  piperacillin-tazobactam (ZOSYN) IVPB 3.375 g  Status:  Discontinued        3.375 g 12.5 mL/hr over 240 Minutes Intravenous Every 8 hours 09/27/23 2305 10/02/23 1106          Medications  bisacodyl  10 mg Rectal Daily   Chlorhexidine Gluconate Cloth  6 each Topical Daily   insulin aspart  0-9 Units Subcutaneous Q8H   ketotifen  2 drop Both Eyes BID   sodium chloride flush  10-40 mL Intracatheter Q12H   sodium chloride flush  5 mL  Intracatheter Q8H   sodium chloride  1 g Oral TID   sodium hypochlorite   Irrigation BID   thiamine (VITAMIN B1) injection  100 mg Intravenous Daily      Subjective:   Jozy Mcphearson was seen and examined today.  Tolerating minimal fluids, G-tube out, feels a lot better today, in good spirits.  Borderline BP asymptomatic, no fevers or chills.  Objective:   Vitals:   10/18/23 0900 10/18/23 1000 10/18/23 1023 10/18/23 1200  BP: (!) 100/37 (!) 87/33 (!) 95/37   Pulse: (!) 59 68 70   Resp: 18 18 (!) 27   Temp:    98 F (36.7 C)  TempSrc:    Oral  SpO2: 95% 97% 97%   Weight:      Height:        Intake/Output Summary (Last 24 hours) at 10/18/2023 1307 Last data filed at 10/18/2023 1200 Gross per 24 hour  Intake 3866.19 ml  Output 2935 ml  Net 931.19 ml     Wt Readings from Last 3 Encounters:  10/18/23 62 kg  09/20/23 56.2 kg  11/23/20 55.3 kg    Physical Exam General: Alert and oriented x 3, NAD Cardiovascular: S1 S2 clear, RRR.  Respiratory: CTAB, no wheezing, rales or rhonchi Gastrointestinal: Midline wound, dressing intact, bilious output Neuro: no new deficits Psych: Normal affect     Data Reviewed:  I have personally reviewed following labs    CBC Lab Results  Component Value Date   WBC 8.3 10/17/2023   RBC 3.20 (L) 10/17/2023   HGB 9.9 (L) 10/17/2023   HCT 30.7 (L) 10/17/2023   MCV 95.9 10/17/2023   MCH 30.9 10/17/2023   PLT 290 10/17/2023   MCHC 32.2 10/17/2023   RDW 15.2 10/17/2023   LYMPHSABS 1.0 10/17/2023   MONOABS 0.9 10/17/2023   EOSABS 0.4 10/17/2023   BASOSABS 0.1 10/17/2023     Last metabolic panel Lab Results  Component Value Date   NA 128 (L) 10/18/2023   K 3.9 10/18/2023   CL 100 10/18/2023   CO2 22  10/18/2023   BUN 14 10/18/2023   CREATININE 0.43 (L) 10/18/2023   GLUCOSE 123 (H) 10/18/2023   GFRNONAA >60 10/18/2023   GFRAA >60 04/10/2016   CALCIUM 8.0 (L) 10/18/2023   PHOS 2.3 (L) 10/18/2023   PROT 6.1 (L) 10/18/2023    ALBUMIN 2.2 (L) 10/18/2023   BILITOT 0.7 10/18/2023   ALKPHOS 148 (H) 10/18/2023   AST 45 (H) 10/18/2023   ALT 49 (H) 10/18/2023   ANIONGAP 6 10/18/2023    CBG (last 3)  Recent Labs    10/17/23 1600 10/17/23 2320 10/18/23 0825  GLUCAP 108* 122* 130*      Coagulation Profile: Recent Labs  Lab 10/12/23 0655  INR 1.1     Radiology Studies: I have personally reviewed the imaging studies  No results found.     Thad Ranger M.D. [facility] 10/18/2023, 1:07 PM  Available via Epic secure chat 7am-7pm After 7 pm, please refer to night coverage provider listed on amion.

## 2023-10-18 NOTE — Progress Notes (Addendum)
17 Days Post-Op   Subjective/Chief Complaint: Alert and oriented this am and in good spirits. No nausea or emesis with CLD but not taking much in. Still passing flatus. Having pain at drain site. Denies abdominal pain   Objective: Vital signs in last 24 hours: Temp:  [97.6 F (36.4 C)-98.1 F (36.7 C)] 97.6 F (36.4 C) (11/07 0359) Pulse Rate:  [52-72] 65 (11/07 0800) Resp:  [11-27] 17 (11/07 0800) BP: (88-117)/(35-96) 104/50 (11/07 0800) SpO2:  [91 %-99 %] 91 % (11/07 0800) Weight:  [62 kg] 62 kg (11/07 0414) Last BM Date : 10/16/23  Intake/Output from previous day: 11/06 0701 - 11/07 0700 In: 2576.9 [P.O.:120; I.V.:1810.2; IV Piggyback:636.7] Out: 2455 [Urine:2325; Drains:130] Intake/Output this shift: Total I/O In: 498.8 [I.V.:480.1; IV Piggyback:18.7] Out: -   Thin elderly female in NAD Alert and oriented x4 today Abd - midline wound clean with granulation tissue; bluish tint to drainage JP bilious output Colostomy with air and scant thin liquid in bag  Lab Results:  Recent Labs    10/16/23 0846 10/17/23 0815  WBC 8.7 8.3  HGB 10.0* 9.9*  HCT 31.3* 30.7*  PLT 309 290   BMET Recent Labs    10/17/23 0815 10/18/23 0533  NA 130* 128*  K 3.6 3.9  CL 104 100  CO2 21* 22  GLUCOSE 133* 123*  BUN 17 14  CREATININE 0.56 0.43*  CALCIUM 7.9* 8.0*   PT/INR No results for input(s): "LABPROT", "INR" in the last 72 hours. ABG No results for input(s): "PHART", "HCO3" in the last 72 hours.  Invalid input(s): "PCO2", "PO2"  Studies/Results: No results found.  Anti-infectives: Anti-infectives (From admission, onward)    Start     Dose/Rate Route Frequency Ordered Stop   10/15/23 1800  vancomycin (VANCOCIN) IVPB 1000 mg/200 mL premix  Status:  Discontinued        1,000 mg 200 mL/hr over 60 Minutes Intravenous Every 24 hours 10/14/23 1740 10/15/23 1045   10/15/23 1430  piperacillin-tazobactam (ZOSYN) IVPB 3.375 g        3.375 g 100 mL/hr over 30 Minutes  Intravenous  Once 10/15/23 1343 10/15/23 1503   10/15/23 1400  DAPTOmycin (CUBICIN) IVPB 500 mg/30mL premix        8 mg/kg  64.1 kg 100 mL/hr over 30 Minutes Intravenous Daily 10/15/23 1045     10/14/23 1800  vancomycin (VANCOREADY) IVPB 1250 mg/250 mL        1,250 mg 166.7 mL/hr over 90 Minutes Intravenous  Once 10/14/23 1740 10/14/23 2255   10/14/23 1400  piperacillin-tazobactam (ZOSYN) IVPB 3.375 g        3.375 g 12.5 mL/hr over 240 Minutes Intravenous Every 8 hours 10/14/23 0845     10/14/23 0900  piperacillin-tazobactam (ZOSYN) IVPB 3.375 g        3.375 g 100 mL/hr over 30 Minutes Intravenous NOW 10/14/23 0844 10/14/23 1002   10/05/23 1200  piperacillin-tazobactam (ZOSYN) IVPB 3.375 g        3.375 g 12.5 mL/hr over 240 Minutes Intravenous Every 8 hours 10/05/23 0721 10/09/23 1716   10/04/23 2000  piperacillin-tazobactam (ZOSYN) IVPB 3.375 g  Status:  Discontinued        3.375 g 12.5 mL/hr over 240 Minutes Intravenous Every 8 hours 10/04/23 1827 10/05/23 0721   09/27/23 2315  piperacillin-tazobactam (ZOSYN) IVPB 3.375 g  Status:  Discontinued        3.375 g 12.5 mL/hr over 240 Minutes Intravenous Every 8 hours 09/27/23 2305 10/02/23 1106  Assessment/Plan: POD17 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 [name] - CT 10/24 with hematoma in the pelvis adjacent to small bowel anastomosis and ileus vs obstruction  - IR 11/1: drain placement for small bowel anastomotic leak/ abscess drainage - PICC and TPN.  - WTD midline wound with dakins - NGT out and tolerating CLD with low intake. Colostomy without output charted overnight but air in bag this am - drain output up to 130/24h (20 ml/24h prior 24h) - Drain aspirate growing Staph aureus, abx per ID - WBC normal yesterday. afebrile   Continue CLD today and trend drain output. Will get CT scan to follow up anastomotic leak   FEN: PICC/TPN; clear liquids today VTE: IVC Filter  ID: [email or web address]  11/3>>   - per TRH -  ABL anemia secondary to post-op hematomas and hematuria  Acute PE and LLE DVT Pancreatic tail cyst Thyroid nodule  Chronic HFpEF Paroxysmal A. Fib Pre-diabetes RLS   LOS: 21 days   Eric Form, [facility] 10/18/2023, 9:02 AM Please see Amion for pager number during day hours 7:00am-4:30pm

## 2023-10-18 NOTE — Progress Notes (Signed)
Physical Therapy Treatment Patient Details Name: Elizabeth Odom MRN: [id number] DOB: October 10, 1941 Today's Date: 10/18/2023   History of Present Illness Patient is a 82 year old female who presented on 10/17 with acute colitis. on10/21, patient underwent ex laparotomy with hartman's resection and colostomy and small bowel resection. PMH: a fib, RLS, syncope, cervical CA, cervical spine fusions, anemia    PT Comments  Pt seen in step down ICU RM# [id number] Pt much more alter this session.  "Hungry".  Pt upgraded to a liquid diet.  Pt wanted an [place].  Assisted OOB to recliner required increased time and effort.  General bed mobility comments: required Mod Asisst to perform supine to sit and Max Assist to complete scooting to EOB. General transfer comment: performed "Bear Hug" SPS 1/4 turn from elevated bed to recliner.  Pt VERY fearful.  Weak.  B knees buckle. Not able to take any functional steps. Weak.  Positioned in recliner to comfort. LPT has rec SNF.    If plan is discharge home, recommend the following: A lot of help with walking and/or transfers;A lot of help with bathing/dressing/bathroom;Assistance with cooking/housework;Help with stairs or ramp for entrance   Can travel by private vehicle     No  Equipment Recommendations  None recommended by PT    Recommendations for Other Services       Precautions / Restrictions Precautions Precautions: Fall Precaution Comments: abd surgery; colostomy; NG tube, MRSA Restrictions Weight Bearing Restrictions: No Other Position/Activity Restrictions: abdominal surgery, colostomy     Mobility  Bed Mobility Overal bed mobility: Needs Assistance Bed Mobility: Supine to Sit     Supine to sit: Mod assist     General bed mobility comments: required Mod Asisst to perform supine to sit and Max Assist to complete scooting to EOB.    Transfers Overall transfer level: Needs assistance Equipment used: None Transfers: Bed to  chair/wheelchair/BSC   Stand pivot transfers: Max assist, Total assist         General transfer comment: performed "Bear Hug" SPS 1/4 turn from elevated bed to recliner.  Pt VERY fearful.  Weak.  B knees buckle.    Ambulation/Gait         Gait velocity: decreased     General Gait Details: transfers only this session   Stairs             Wheelchair Mobility     Tilt Bed    Modified Rankin (Stroke Patients Only)       Balance                                            Cognition Arousal: Alert Behavior During Therapy: WFL for tasks assessed/performed, Flat affect Overall Cognitive Status: Within Functional Limits for tasks assessed                                 General Comments: much improved cognition        Exercises      General Comments        Pertinent Vitals/Pain Pain Assessment Pain Assessment: No/denies pain    Home Living                          Prior Function  PT Goals (current goals can now be found in the care plan section) Progress towards PT goals: Progressing toward goals    Frequency    Min 1X/week      PT Plan      Co-evaluation              AM-PAC PT "6 Clicks" Mobility   Outcome Measure  Help needed turning from your back to your side while in a flat bed without using bedrails?: A Little Help needed moving from lying on your back to sitting on the side of a flat bed without using bedrails?: A Little Help needed moving to and from a bed to a chair (including a wheelchair)?: A Little Help needed standing up from a chair using your arms (e.g., wheelchair or bedside chair)?: Total Help needed to walk in hospital room?: Total Help needed climbing 3-5 steps with a railing? : Total 6 Click Score: 12    End of Session Equipment Utilized During Treatment: Gait belt Activity Tolerance: Treatment limited secondary to medical complications  (Comment) Patient left: in chair;with call bell/phone within reach Nurse Communication: Mobility status PT Visit Diagnosis: Difficulty in walking, not elsewhere classified (R26.2);Muscle weakness (generalized) (M62.81)     Time: 1610-9604 PT Time Calculation (min) (ACUTE ONLY): 24 min  Charges:    $Therapeutic Activity: 23-37 mins PT General Charges $$ ACUTE PT VISIT: 1 Visit                    Felecia Shelling  PTA Acute  Rehabilitation Services Office M-F          [phone]

## 2023-10-18 NOTE — Progress Notes (Signed)
PHARMACY - TOTAL PARENTERAL NUTRITION CONSULT NOTE   Indication:  Prolonged ileus  Patient Measurements: Height: 5\' 5"  (165.1 cm) Weight: 62 kg (136 lb 11 oz) IBW/kg (Calculated) : 57 TPN AdjBW (KG): 62 Body mass index is 22.75 kg/m. Usual Weight: 64.5 on 10/04/2023  Assessment:  TPN per Pharmacy consult for this 82 yo female admitted on 09/27/2023 with recurrent abdominal pain, nausea, emesis, diarrhea and intolerance of oral intake. Surgery completed exp lap with small bowel resection and Hartman's resection and colostomy on 10/01/2023.  Patient continues to have postop ileus, managed by general surgery.  NG remains in place to suction. 11/31 imaging showed anastomic leak. 11/1 IR drain catheter placement into pelvic abscess.  11/6 Beginning to have bowel fucntion with colostomy output. NG tube discontinued.  Clear liquids started.   Glucose / Insulin: No PMH of DM.  Goal CBGs < 150. 24 hr glucose range: 108-136 24 hr units of insulin aspart usage: 2 units Electrolytes: Na continuing to trend down & remains low, Phos low/improved, K improved to WNL but below goal, magnesium low at 1.8, C02 improved to WNL.  CorrCa 9.76 - Goal K >= 4, mag >=2, phos ~3 - Unable to custom correct electrolytes in TPN, but will continue to supplement in and outside of TPN. Renal: SCr <1, BUN WNL Hepatic: Alk phos WNL, AST/ALT slightly elevated, Total Bilirubin WNL Intake / Output; MIVF: Net I/O + 122 mL Drains 130 mL, Urine 2325 mL, Stool 0 mL mIVF: D10 @ 40 ml/hr  GI Imaging: 10/10 CT A/P: small bowel enteritis/colitis that was also involving the sigmoid colon. No bowel obstruction seen.  10/17 CT A/P: wall thickening sigmoid colon worrisome for neoplasm. Focal colitis not excluded. Colonic dilation compatible with obstruction.  Cystic mass at the tail of the pancreas.  10/18 MR - 9 mm cystic lesion within pancreatic body/tail junction, colonic obstruction 10/31 CT A/P - colostomy and Hartmann's  pouch seen.  Appears to be extravasation of contrast suggesting perforation or leakage. GI Surgeries / Procedures:  10/19 flexible sigmoidoscopy 10/21 colectomy with colostomy creation/Hartman procedure 11/1: CT guided visceral fluid drain by perc cath  Central access: PICC line placed 11/1 TPN start date: 11/1  Nutritional Goals: Due to severe national shortage of IV fluids related to [facility] in [facility], pharmacy will utilize best available premix TPN formula to meet at least 80% of protein and 75% of kcal goals  TPN:  Clinimix 8/10, 1L x 5 days per week and 2L x 2 days per week with daily lipids ( ) to provide 103 g protein and an average of 1353 kcal per day D10 at 40 ml/hr provides an additional 326 kcal  Average daily total:  103 g of protein (100% of goal), 1679 kcals (88% of goal)   RD Assessment:  Estimated Needs Total Energy Estimated Needs: 1900-2100 Total Protein Estimated Needs: 90-105g Total Fluid Estimated Needs: 2L/day  Current Nutrition:  Clear liquids  - NG tube removed 11/6 TPN  Plan:  Now:  Magnesium 2 g IV x 1 K-Phos 30 mmol IV x 1 Add NaCl 1 g po TID per attending order  At 1800: Clinimix 8/10 TPN with electrolytes Alternate 2000 mL twice weekly (Monday/Thursday), 1000 mL other days Electrolytes in TPN: Na 35 mEq/L, K 30 mEq/L, Ca 4.5 mEq/L, Mg 5 mEq/L, and Phos 15 mmol/L. Cl:Ac 1:1 Add standard MVI and trace elements to TPN, no chromium due to shortage SMOF Lipid 20% over 12 hours Dextrose 10% IV at 40  ml/hr  Thiamine 100mg  IV daily x 5 days (11/4-11/8) Continue Sensitive SSI q8h and adjust as needed Monitor TPN labs on Mon/Thurs and PRN , BMET with magnesium and phosphorus x 3 Follow up ability to tolerate enteral diet and wean TPN when intake is >60% of nutritional needs.     Thank you for allowing pharmacy to be a part of this patient's care.  Selinda Eon, PharmD, BCPS Clinical Pharmacist [place] 10/18/2023 9:15  AM

## 2023-10-19 ENCOUNTER — Inpatient Hospital Stay (HOSPITAL_COMMUNITY): Payer: PPO

## 2023-10-19 DIAGNOSIS — K651 Peritoneal abscess: Secondary | ICD-10-CM | POA: Diagnosis not present

## 2023-10-19 DIAGNOSIS — K529 Noninfective gastroenteritis and colitis, unspecified: Secondary | ICD-10-CM | POA: Diagnosis not present

## 2023-10-19 DIAGNOSIS — D500 Iron deficiency anemia secondary to blood loss (chronic): Secondary | ICD-10-CM | POA: Diagnosis not present

## 2023-10-19 DIAGNOSIS — R1084 Generalized abdominal pain: Secondary | ICD-10-CM | POA: Diagnosis not present

## 2023-10-19 DIAGNOSIS — K56609 Unspecified intestinal obstruction, unspecified as to partial versus complete obstruction: Secondary | ICD-10-CM | POA: Diagnosis not present

## 2023-10-19 LAB — GLUCOSE, CAPILLARY
Glucose-Capillary: 130 mg/dL — ABNORMAL HIGH (ref 70–99)
Glucose-Capillary: 139 mg/dL — ABNORMAL HIGH (ref 70–99)
Glucose-Capillary: 140 mg/dL — ABNORMAL HIGH (ref 70–99)

## 2023-10-19 LAB — BASIC METABOLIC PANEL
Anion gap: 5 (ref 5–15)
BUN: 19 mg/dL (ref 8–23)
CO2: 20 mmol/L — ABNORMAL LOW (ref 22–32)
Calcium: 7.7 mg/dL — ABNORMAL LOW (ref 8.9–10.3)
Chloride: 103 mmol/L (ref 98–111)
Creatinine, Ser: 0.46 mg/dL (ref 0.44–1.00)
GFR, Estimated: >60 mL/min (ref >60)
Glucose, Bld: 123 mg/dL — ABNORMAL HIGH (ref 70–99)
Potassium: 3.8 mmol/L (ref 3.5–5.1)
Sodium: 128 mmol/L — ABNORMAL LOW (ref 135–145)

## 2023-10-19 LAB — MAGNESIUM: Magnesium: 2 mg/dL (ref 1.7–2.4)

## 2023-10-19 LAB — PHOSPHORUS: Phosphorus: 2.3 mg/dL — ABNORMAL LOW (ref 2.5–4.6)

## 2023-10-19 LAB — CBC
HCT: 32.2 % — ABNORMAL LOW (ref 36.0–46.0)
Hemoglobin: 10.3 g/dL — ABNORMAL LOW (ref 12.0–15.0)
MCH: 30.7 pg (ref 26.0–34.0)
MCHC: 32 g/dL (ref 30.0–36.0)
MCV: 95.8 fL (ref 80.0–100.0)
Platelets: 318 10*3/uL (ref 150–400)
RBC: 3.36 MIL/uL — ABNORMAL LOW (ref 3.87–5.11)
RDW: 15.2 % (ref 11.5–15.5)
WBC: 7.2 10*3/uL (ref 4.0–10.5)
nRBC: 0 % (ref 0.0–0.2)

## 2023-10-19 MED ORDER — TRACE MINERALS CU-MN-SE-ZN 300-55-60-3000 MCG/ML IV SOLN
41.0000 mL/h | INTRAVENOUS | Status: AC
  Filled 2023-10-19: qty 1000

## 2023-10-19 MED ORDER — FAT EMUL FISH OIL/PLANT BASED 20% (SMOFLIPID)IV EMUL
250.0000 mL | INTRAVENOUS | Status: AC
  Administered 2023-10-19: 250 mL via INTRAVENOUS
  Filled 2023-10-19: qty 250

## 2023-10-19 MED ORDER — IOHEXOL 300 MG/ML  SOLN
100.0000 mL | Freq: Once | INTRAMUSCULAR | Status: AC | PRN
  Administered 2023-10-19: 100 mL via INTRAVENOUS

## 2023-10-19 MED ORDER — SODIUM PHOSPHATES 45 MMOLE/15ML IV SOLN
30.0000 mmol | Freq: Once | INTRAVENOUS | Status: AC
  Administered 2023-10-19: 30 mmol via INTRAVENOUS
  Filled 2023-10-19: qty 10

## 2023-10-19 MED ORDER — POTASSIUM CHLORIDE 10 MEQ/50ML IV SOLN
10.0000 meq | INTRAVENOUS | Status: AC
  Administered 2023-10-19 (×4): 10 meq via INTRAVENOUS
  Filled 2023-10-19 (×4): qty 50

## 2023-10-19 NOTE — Progress Notes (Signed)
PHARMACY - TOTAL PARENTERAL NUTRITION CONSULT NOTE   Indication:  Prolonged ileus  Patient Measurements: Height: 5\' 5"  (165.1 cm) Weight: 62 kg (136 lb 11 oz) IBW/kg (Calculated) : 57 TPN AdjBW (KG): 62 Body mass index is 22.75 kg/m. Usual Weight: 64.5 on 10/04/2023  Assessment:  TPN per Pharmacy consult for this 82 yo female admitted on 09/27/2023 with recurrent abdominal pain, nausea, emesis, diarrhea and intolerance of oral intake. Surgery completed exp lap with small bowel resection and Hartman's resection and colostomy on 10/01/2023.  Patient continues to have postop ileus, managed by general surgery.  NG remains in place to suction. 11/31 imaging showed anastomic leak. 11/1 IR drain catheter placement into pelvic abscess.  11/6 Beginning to have bowel fucntion with colostomy output. NG tube discontinued.  Clear liquids started.   Glucose / Insulin: No PMH of DM.  Goal CBGs < 150. 24 hr glucose range: 117-131 24 hr units of insulin aspart usage: 2 units Electrolytes: Na remains low (unable to adjust Na in TPN and also on D10; PO started 11/7).  Phos low, K WNL but below goal, C02 low.  CorrCa 9.1 - Goal K >= 4, mag >=2, phos ~3 - Unable to custom correct electrolytes in TPN, but will continue to supplement in and outside of TPN. Renal: SCr <1, BUN WNL Hepatic: Alk phos WNL, AST/ALT slightly elevated, Total Bilirubin WNL.  TG WNL (11/4) Intake / Output; MIVF: Net I/O + 112 mL Drains 95 mL, Urine 3500 mL, Stool 5 mL mIVF: D10 @ 40 ml/hr  GI Imaging: 10/10 CT A/P: small bowel enteritis/colitis that was also involving the sigmoid colon. No bowel obstruction seen.  10/17 CT A/P: wall thickening sigmoid colon worrisome for neoplasm. Focal colitis not excluded. Colonic dilation compatible with obstruction.  Cystic mass at the tail of the pancreas.  10/18 MR - 9 mm cystic lesion within pancreatic body/tail junction, colonic obstruction 10/31 CT A/P - colostomy and Hartmann's pouch  seen.  Appears to be extravasation of contrast suggesting perforation or leakage. GI Surgeries / Procedures:  10/19 flexible sigmoidoscopy 10/21 colectomy with colostomy creation/Hartman procedure 11/1: CT guided visceral fluid drain by perc cath  Central access: PICC line placed 11/1 TPN start date: 11/1  Nutritional Goals: Due to severe national shortage of IV fluids related to [facility] in [facility], pharmacy will utilize best available premix TPN formula to meet at least 80% of protein and 75% of kcal goals  TPN:  Clinimix 8/10, 1L x 5 days per week and 2L x 2 days per week with daily lipids ( ) to provide 103 g protein and an average of 1353 kcal per day D10 at 40 ml/hr provides an additional 326 kcal  Average daily total:  103 g of protein (100% of goal), 1679 kcals (88% of goal)   RD Assessment:  Estimated Needs Total Energy Estimated Needs: 1900-2100 Total Protein Estimated Needs: 90-105g Total Fluid Estimated Needs: 2L/day  Current Nutrition:  Clear liquids  TPN  Plan:  Now:  Na-Phos 30 mmol IV x 1 KCl 10 mEq IV x 4 runs  At 1800: Clinimix 8/10 TPN with electrolytes Alternate 2000 mL twice weekly (Monday/Thursday), 1000 mL other days Electrolytes in TPN: Na 35 mEq/L, K 30 mEq/L, Ca 4.5 mEq/L, Mg 5 mEq/L, and Phos 15 mmol/L. Cl:Ac 1:1 Add standard MVI and trace elements to TPN, no chromium due to shortage SMOF Lipid 20% over 12 hours Dextrose 10% IV at 40 ml/hr  Thiamine 100mg  IV daily x 5  days (11/4-11/8) Continue NaCl 1g tablet PO TID Continue Sensitive SSI q8h and adjust as needed Monitor TPN labs on Mon/Thurs and PRN , BMET with magnesium and phosphorus x 3 Follow up ability to tolerate enteral diet and wean TPN when intake is >60% of nutritional needs.     Thank you for allowing pharmacy to be a part of this patient's care.  Selinda Eon, PharmD, BCPS Clinical Pharmacist [facility] 10/19/2023 7:16 AM

## 2023-10-19 NOTE — TOC Progression Note (Signed)
Transition of Care [facility]) - Progression Note    Patient Details  Name: Elizabeth Odom MRN: [id number] Date of Birth: [birth year]  [facility] CM/SW Contact  Lavenia Atlas, RN Phone Number: 10/19/2023, 6:18 PM  Clinical Narrative:   Per chart review patient currently in [facility] SDU and is in agreement with [facility] services with [facility]. This RNCM spoke with patient to discuss PT recommendation for short term SNF. Patient reports she does not wish to go to SNF and request this RNCM speak with her grand daughter. This RNCM spoke with patient's grand daughter Elizabeth Odom who reports patient did not have any DME or HH prior to admission, as patient was very active and swam at least twice a week. Per Elizabeth Odom patient is refusing short term SNF. This RNCM answered Elizabeth Odom questions re: short term SNF. Elizabeth Odom questioning potential discharge date, this RNCM advised that is up to the MD who will determine when patient is medically stable.  TOC will continue to follow for discharge needs.    Expected Discharge Plan: Home w Home Health Services Barriers to Discharge: Continued Medical Work up  Expected Discharge Plan and Services In-house Referral: NA Discharge Planning Services: CM Consult Post Acute Care Choice: Home Health Living arrangements for the past 2 months: Apartment                 DME Arranged: N/A DME Agency: NA       HH Arranged: PT, RN HH Agency: [facility] Date HH Agency Contacted: 10/02/23 Time HH Agency Contacted: 1550 Representative spoke with at [facility] Agency: Morrie Sheldon   Social Determinants of Health (SDOH) Interventions SDOH Screenings   Food Insecurity: No Food Insecurity (09/28/2023)  Housing: Low Risk  (09/28/2023)  Transportation Needs: No Transportation Needs (09/28/2023)  Utilities: Not At Risk (09/28/2023)  Tobacco Use: Low Risk  (10/04/2023)    Readmission Risk Interventions    10/19/2023    6:13 PM  Readmission Risk  Prevention Plan  Transportation Screening Complete  PCP or Specialist Appt within 5-7 Days Complete  Home Care Screening Complete  Medication Review (RN CM) Complete

## 2023-10-19 NOTE — Progress Notes (Signed)
[facility]                                                                              Elizabeth Odom, is a 82 y.o. female, DOB - 1941/07/16, WUJ:[id number] Admit date - 09/27/2023    Outpatient Primary MD for the patient is [name], FNP  LOS - 22  days  Chief Complaint  Patient presents with   Abdominal Pain       Brief summary   Patient is a 82 year old F with PMH of A-fib, RLS and anemia presented to ED on 10/17 with recurrent abdominal pain, nausea, emesis, diarrhea and intolerance of oral intake.  Patient was seen in ED on 10/10 for abdominal pain, decreased appetite and fever and discharged on Augmentin for possible colitis noted on CT abdomen and pelvis.  However, she had recurrence of symptoms after interval improvement and presented back to the hospital.   CT initially showed proximal sigmoid colon wall thickening with upstream colonic dilation with air-fluid levels and large amount of stool compatible with obstruction.  There is also a 9 mm cystic lesion in the tail of pancreas, and subcentimeter lucent lesion in L1 vertebral body.  GI consulted, patient refused colonoscopy but agreed to sigmoidoscopy showing stricture versus mass at the rectosigmoid region -scope could not be advanced past this position.  Surgery was consulted -status post ex lap with small bowel resection and Hartman's resection and colostomy on 10/01/2023.  Patient continues to have postop ileus, managed by general surgery.  NG remains in place to suction.   Hospital course complicated by acute PE and LLE DVT for which she was started on heparin. She developed profound acute blood loss anemia with subsequent pelvic hematoma near the small bowel anastomosis as well as notable hematuria.  Urology was consulted, signed off after resolution of hematuria. Given high risk of bleed with notable PE/DVT, evaluated by IR and placed IVC filter on 10/05/2023.  Patient required total of 4 unit of  blood transfusion.   Patient continues to work with PT OT, patient will likely require placement given her prolonged hospitalization, weakness, and diminished ambulatory status from baseline.   As her bowel function did not return, she underwent a repeat CT abd and pelvis on 10/31 showing  anastomotic leak into 7 x 6 cm complex fluid collection posteriorly in the pelvis. IR was consulted and underwent CT guided percutaneous drain placement into pelvic abscess.  Fluid culture grew MRSA. ID consulted and she was transitioned to daptomycin and zosyn.    Assessment & Plan    Principal Problem:   Large bowel obstruction (HCC)  -CT abdomen 10/17 with proximal sigmoid colon thickening.  -GI consulted and underwent  sigmoidoscopy 10/19 with tight stricture - was found to have a mass biopsy negative for malignancy as suggesting inflammation.  -Surgery consulted, underwent ex lap hartman's resection on 10/21 with small bowel resection and ostomy ([name]).  - Repeat CT abd pelvis on 10/31 showed a small leak at her anastomosis going into a 6 x 7 cm fluid collection  - IR consulted and underwent drainage catheter placement for pelvic abscess on 11/1.  -  Fluid culture grew MRSA, ID consulted and transitioned to Daptomycin and zosyn. Duration as per ID.  -NGT out. poor PO intake, continue TPN and clears -Repeat CT abd results pending, Surgery following.      Post  operative ileus vs partial SBO - Exacerbated by intra abd bleeding.  --Continue TPN, repeat CT abd results pending   Hyponatremia -Sodium stable at 128,  -on D10 drip, dc if not needed as patent is on TPN      Acute blood loss anemia sec to pelvic/ peri anastomotic hematomas with Hematuria -S/p 4 units of prbc transfusions.  -S/p IVC filter  -Urology recommended no indication for cystoscopy or intervention.  -H&H stable 10.3    Acute PE with right heart strain with acute left leg DVT Unable to tolerate anticoagulation status post  IVC filter placement on 10/25     Nodule in the inf aspect of the isthmus Thyroid US - follow up outpatient.      Paroxysmal atrial fibrillation Rate controlled. -Not on anticoagulation    Hyponatremia:  Nutritional , monitor.    Hypokalemia and hypophosphatemia and hypomagnesemia Replace as needed   Urinary retention:  -UA negative for infection  Hypotension: resolved.     Leukocytosis  Resolved.    Pressure Injury Documentation: Pressure Injury 10/08/23 Buttocks Right;Mid Stage 2 -  Partial thickness loss of dermis presenting as a shallow open injury with a red, pink wound bed without slough. (Active)  10/08/23 0800  Location: Buttocks  Location Orientation: Right;Mid  Staging: Stage 2 -  Partial thickness loss of dermis presenting as a shallow open injury with a red, pink wound bed without slough.  Wound Description (Comments):   Present on Admission:   Dressing Type Foam - Lift dressing to assess site every shift 10/18/23 2000    Moderate protein calorie malnutrition Nutrition Problem: Moderate Malnutrition Etiology: chronic illness Signs/Symptoms: moderate fat depletion, severe muscle depletion Interventions: Refer to RD note for recommendations, TPN  Estimated body mass index is 22.75 kg/m as calculated from the following:   Height as of this encounter: 5\' 5"  (1.651 m).   Weight as of this encounter: 62 kg.  Code Status: DNR DVT Prophylaxis:  Place and maintain sequential compression device Start: 10/06/23 1319   Level of Care: Level of care: Stepdown Family Communication: Updated patient Disposition Plan:      Remains inpatient appropriate: Will need placement at discharge   Procedures:  10/21 s/p ex lap hartman's resection , small bowel resection and ostomy.  IVC filter placement  Consultants:   IR General Surgery Infectious disease  Antimicrobials:   Anti-infectives (From admission, onward)    Start     Dose/Rate Route Frequency Ordered  Stop   10/15/23 1800  vancomycin (VANCOCIN) IVPB 1000 mg/200 mL premix  Status:  Discontinued        1,000 mg 200 mL/hr over 60 Minutes Intravenous Every 24 hours 10/14/23 1740 10/15/23 1045   10/15/23 1430  piperacillin-tazobactam (ZOSYN) IVPB 3.375 g        3.375 g 100 mL/hr over 30 Minutes Intravenous  Once 10/15/23 1343 10/15/23 1503   10/15/23 1400  DAPTOmycin (CUBICIN) IVPB 500 mg/77mL premix        8 mg/kg  64.1 kg 100 mL/hr over 30 Minutes Intravenous Daily 10/15/23 1045     10/14/23 1800  vancomycin (VANCOREADY) IVPB 1250 mg/250 mL        1,250 mg 166.7 mL/hr over 90 Minutes Intravenous  Once 10/14/23 1740 10/14/23 2255  10/14/23 1400  piperacillin-tazobactam (ZOSYN) IVPB 3.375 g        3.375 g 12.5 mL/hr over 240 Minutes Intravenous Every 8 hours 10/14/23 0845     10/14/23 0900  piperacillin-tazobactam (ZOSYN) IVPB 3.375 g        3.375 g 100 mL/hr over 30 Minutes Intravenous NOW 10/14/23 0844 10/14/23 1002   10/05/23 1200  piperacillin-tazobactam (ZOSYN) IVPB 3.375 g        3.375 g 12.5 mL/hr over 240 Minutes Intravenous Every 8 hours 10/05/23 0721 10/09/23 1716   10/04/23 2000  piperacillin-tazobactam (ZOSYN) IVPB 3.375 g  Status:  Discontinued        3.375 g 12.5 mL/hr over 240 Minutes Intravenous Every 8 hours 10/04/23 1827 10/05/23 0721   09/27/23 2315  piperacillin-tazobactam (ZOSYN) IVPB 3.375 g  Status:  Discontinued        3.375 g 12.5 mL/hr over 240 Minutes Intravenous Every 8 hours 09/27/23 2305 10/02/23 1106          Medications  bisacodyl  10 mg Rectal Daily   Chlorhexidine Gluconate Cloth  6 each Topical Daily   insulin aspart  0-9 Units Subcutaneous Q8H   ketotifen  2 drop Both Eyes BID   sodium chloride flush  10-40 mL Intracatheter Q12H   sodium chloride flush  5 mL Intracatheter Q8H   sodium chloride  1 g Oral TID   sodium hypochlorite   Irrigation BID      Subjective:   Elizabeth Odom was seen and examined today. Somewhat sleepy this  morning, arousable. No acute issues.  Tolerating minimal fluids. NGT out.    Objective:   Vitals:   10/19/23 0200 10/19/23 0300 10/19/23 0400 10/19/23 0800  BP: (!) 120/48 (!) 109/58 (!) 121/56   Pulse: 75 69 71   Resp: (!) 28 19 (!) 21   Temp:   99.3 F (37.4 C) 98.8 F (37.1 C)  TempSrc:   Oral Axillary  SpO2: 97% 98% 96%   Weight:      Height:        Intake/Output Summary (Last 24 hours) at 10/19/2023 1244 Last data filed at 10/19/2023 1019 Gross per 24 hour  Intake 3361.88 ml  Output 2520 ml  Net 841.88 ml     Wt Readings from Last 3 Encounters:  10/18/23 62 kg  09/20/23 56.2 kg  11/23/20 55.3 kg   Physical Exam General: sleepy but arousable  Cardiovascular: S1 S2 clear, RRR.  Respiratory: CTAB Gastrointestinal: midline wound, JP + bilious output, colostomy +    Ext: no pedal edema bilaterally Neuro:  Psych: Normal affect     Data Reviewed:  I have personally reviewed following labs    CBC Lab Results  Component Value Date   WBC 7.2 10/19/2023   RBC 3.36 (L) 10/19/2023   HGB 10.3 (L) 10/19/2023   HCT 32.2 (L) 10/19/2023   MCV 95.8 10/19/2023   MCH 30.7 10/19/2023   PLT 318 10/19/2023   MCHC 32.0 10/19/2023   RDW 15.2 10/19/2023   LYMPHSABS 1.0 10/17/2023   MONOABS 0.9 10/17/2023   EOSABS 0.4 10/17/2023   BASOSABS 0.1 10/17/2023     Last metabolic panel Lab Results  Component Value Date   NA 128 (L) 10/19/2023   K 3.8 10/19/2023   CL 103 10/19/2023   CO2 20 (L) 10/19/2023   BUN 19 10/19/2023   CREATININE 0.46 10/19/2023   GLUCOSE 123 (H) 10/19/2023   GFRNONAA >60 10/19/2023   GFRAA >60 04/10/2016  CALCIUM 7.7 (L) 10/19/2023   PHOS 2.3 (L) 10/19/2023   PROT 6.1 (L) 10/18/2023   ALBUMIN 2.2 (L) 10/18/2023   BILITOT 0.7 10/18/2023   ALKPHOS 148 (H) 10/18/2023   AST 45 (H) 10/18/2023   ALT 49 (H) 10/18/2023   ANIONGAP 5 10/19/2023    CBG (last 3)  Recent Labs    10/18/23 1555 10/18/23 2324 10/19/23 0751  GLUCAP 117* 131* 139*       Coagulation Profile: No results for input(s): "INR", "PROTIME" in the last 168 hours.    Radiology Studies: I have personally reviewed the imaging studies  No results found.     Thad Ranger M.D. [facility] 10/19/2023, 12:44 PM  Available via Epic secure chat 7am-7pm After 7 pm, please refer to night coverage provider listed on amion.

## 2023-10-19 NOTE — Progress Notes (Signed)
Occupational Therapy Treatment Patient Details Name: Elizabeth Odom MRN: [id number] DOB: 1941/11/07 Today's Date: 10/19/2023   History of present illness Patient is a 82 year old female who presented on 10/17 with acute colitis. On 10/21, patient underwent ex laparotomy with hartman's resection and colostomy and small bowel resection. PMH: a fib, RLS, syncope, cervical CA, cervical spine fusions, anemia   OT comments  The pt required significant assistance to perform bed mobility, including supine to sit and sit to supine. She presented with poor sitting balance EOB, requiring min to mod assist to maintain, given posterior lean and guarding of movements due to reports of 8/10 abdominal pain. She was instructed on simple ROM and therapeutic exercises seated EOB, though her overall activity tolerance was compromised. Continue OT plan of care. Patient will benefit from continued inpatient follow up therapy, <3 hours/day.       If plan is discharge home, recommend the following:  A lot of help with bathing/dressing/bathroom;Assistance with cooking/housework;Assist for transportation;A lot of help with walking and/or transfers;Direct supervision/assist for medications management   Equipment Recommendations  Other (comment) (defer to next level of care)    Recommendations for Other Services      Precautions / Restrictions Precautions Precautions: Fall Precaution Comments: contact precautions Restrictions Weight Bearing Restrictions: No Other Position/Activity Restrictions: abdominal surgery, colostomy       Mobility Bed Mobility Overal bed mobility: Needs Assistance Bed Mobility: Sit to Supine, Supine to Sit     Supine to sit: Max assist, HOB elevated, Used rails Sit to supine: Max assist   General bed mobility comments: required increased time and effort, as well as cues for transfer technique/sequencing, including advancing BLE off bed, weight shifting, and trunk positioning, in  order to achieve supine to sit             ADL either performed or assessed with clinical judgement   ADL      Lower Body Dressing: Total assistance Lower Body Dressing Details (indicate cue type and reason): for sock management seated EOB                               Cognition Arousal:  (slight lethargy) Behavior During Therapy: Flat affect Overall Cognitive Status: Within Functional Limits for tasks assessed                      Pertinent Vitals/ Pain       Pain Assessment Pain Assessment: 0-10 Pain Score: 8  Pain Location: abdomen Pain Intervention(s): Limited activity within patient's tolerance, Monitored during session, Repositioned         Frequency  Min 1X/week        Progress Toward Goals  OT Goals(current goals can now be found in the care plan section)     Acute Rehab OT Goals Patient Stated Goal: decreased pain and to get better OT Goal Formulation: With patient Time For Goal Achievement: 11/02/23 Potential to Achieve Goals: Good ADL Goals Pt Will Perform Upper Body Dressing: with min assist;sitting Pt Will Perform Lower Body Dressing: with min assist;with adaptive equipment;sitting/lateral leans;sit to/from stand Pt Will Transfer to Toilet: with min assist;stand pivot transfer;bedside commode Pt Will Perform Toileting - Clothing Manipulation and hygiene: with min assist;sit to/from stand  Plan         AM-PAC OT "6 Clicks" Daily Activity     Outcome Measure   Help from another person eating meals?:  A Little Help from another person taking care of personal grooming?: A Little Help from another person toileting, which includes using toliet, bedpan, or urinal?: Total Help from another person bathing (including washing, rinsing, drying)?: A Lot Help from another person to put on and taking off regular upper body clothing?: A Lot Help from another person to put on and taking off regular lower body clothing?: Total 6 Click  Score: 12    End of Session Equipment Utilized During Treatment: Other (comment) (N/A)  OT Visit Diagnosis: Muscle weakness (generalized) (M62.81);Pain Pain - part of body:  (abdomen)   Activity Tolerance Patient limited by fatigue;Patient limited by pain   Patient Left in bed;with call bell/phone within reach;with bed alarm set   Nurse Communication Other (comment) (nurse cleared pt for therapy)        Time: 8295-6213 OT Time Calculation (min): 22 min  Charges: OT General Charges $OT Visit: 1 Visit OT Treatments $Therapeutic Activity: 8-22 mins     Reuben Likes, OTR/L 10/19/2023, 4:20 PM

## 2023-10-19 NOTE — Progress Notes (Signed)
18 Days Post-Op   Subjective/Chief Complaint: Tolerating CLD without nausea or abdominal pain but low appetite. Passing flatus per ostomy but minimal stool   Objective: Vital signs in last 24 hours: Temp:  [98 F (36.7 C)-99.3 F (37.4 C)] 98.8 F (37.1 C) (11/08 0800) Pulse Rate:  [56-75] 71 (11/08 0400) Resp:  [15-36] 21 (11/08 0400) BP: (87-128)/(33-88) 121/56 (11/08 0400) SpO2:  [96 %-99 %] 96 % (11/08 0400) Last BM Date : 10/16/23  Intake/Output from previous day: 11/07 0701 - 11/08 0700 In: 3712 [I.V.:2610.7; IV Piggyback:1091.4] Out: 3600 [Urine:3500; Drains:95; Stool:5] Intake/Output this shift: Total I/O In: 526.2 [I.V.:495.3; IV Piggyback:30.9] Out: -   Thin elderly female in NAD Alert and oriented x4 today Abd - midline wound clean with granulation tissue; scant ss drainage JP bilious output Colostomy with air and scant thin liquid in bag  Lab Results:  Recent Labs    10/17/23 0815 10/19/23 0414  WBC 8.3 7.2  HGB 9.9* 10.3*  HCT 30.7* 32.2*  PLT 290 318   BMET Recent Labs    10/18/23 0533 10/19/23 0414  NA 128* 128*  K 3.9 3.8  CL 100 103  CO2 22 20*  GLUCOSE 123* 123*  BUN 14 19  CREATININE 0.43* 0.46  CALCIUM 8.0* 7.7*   PT/INR No results for input(s): "LABPROT", "INR" in the last 72 hours. ABG No results for input(s): "PHART", "HCO3" in the last 72 hours.  Invalid input(s): "PCO2", "PO2"  Studies/Results: No results found.  Anti-infectives: Anti-infectives (From admission, onward)    Start     Dose/Rate Route Frequency Ordered Stop   10/15/23 1800  vancomycin (VANCOCIN) IVPB 1000 mg/200 mL premix  Status:  Discontinued        1,000 mg 200 mL/hr over 60 Minutes Intravenous Every 24 hours 10/14/23 1740 10/15/23 1045   10/15/23 1430  piperacillin-tazobactam (ZOSYN) IVPB 3.375 g        3.375 g 100 mL/hr over 30 Minutes Intravenous  Once 10/15/23 1343 10/15/23 1503   10/15/23 1400  DAPTOmycin (CUBICIN) IVPB 500 mg/6mL premix         8 mg/kg  64.1 kg 100 mL/hr over 30 Minutes Intravenous Daily 10/15/23 1045     10/14/23 1800  vancomycin (VANCOREADY) IVPB 1250 mg/250 mL        1,250 mg 166.7 mL/hr over 90 Minutes Intravenous  Once 10/14/23 1740 10/14/23 2255   10/14/23 1400  piperacillin-tazobactam (ZOSYN) IVPB 3.375 g        3.375 g 12.5 mL/hr over 240 Minutes Intravenous Every 8 hours 10/14/23 0845     10/14/23 0900  piperacillin-tazobactam (ZOSYN) IVPB 3.375 g        3.375 g 100 mL/hr over 30 Minutes Intravenous NOW 10/14/23 0844 10/14/23 1002   10/05/23 1200  piperacillin-tazobactam (ZOSYN) IVPB 3.375 g        3.375 g 12.5 mL/hr over 240 Minutes Intravenous Every 8 hours 10/05/23 0721 10/09/23 1716   10/04/23 2000  piperacillin-tazobactam (ZOSYN) IVPB 3.375 g  Status:  Discontinued        3.375 g 12.5 mL/hr over 240 Minutes Intravenous Every 8 hours 10/04/23 1827 10/05/23 0721   09/27/23 2315  piperacillin-tazobactam (ZOSYN) IVPB 3.375 g  Status:  Discontinued        3.375 g 12.5 mL/hr over 240 Minutes Intravenous Every 8 hours 09/27/23 2305 10/02/23 1106       Assessment/Plan: POD18 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 [name] - CT 10/24  with hematoma in the pelvis adjacent to small bowel anastomosis and ileus vs obstruction  - IR 11/1: drain placement for small bowel anastomotic leak/ abscess drainage - PICC and TPN.  - WTD midline wound with dakins - NGT out and tolerating CLD with low intake. Colostomy with minimal output but passing flatus - drain output up to 95/24h (130 ml/24h prior 24h) - Drain aspirate growing Staph aureus, abx per ID - WBC normal yesterday. afebrile   Continue CLD today and trend drain output. Await CT results   FEN: PICC/TPN; clear liquids today VTE: IVC Filter  ID: [email or web address] 11/3>>   - per TRH -  ABL anemia secondary to post-op hematomas and hematuria  Acute PE and LLE DVT Pancreatic tail cyst Thyroid nodule  Chronic  HFpEF Paroxysmal A. Fib Pre-diabetes RLS   LOS: 22 days   Eric Form, [facility] 10/19/2023, 9:42 AM Please see Amion for pager number during day hours 7:00am-4:30pm

## 2023-10-19 NOTE — Progress Notes (Addendum)
[facility]   Reason for visit: Follow up on intra abdominal abscess  Interval History: CT scan done today and noted resolution of the previously seen air and fluid collection; remains afebrile, WBC 7.2 Day 6 antibiotics  Physical Exam: Constitutional:  Vitals:   10/19/23 1300 10/19/23 1400  BP: (!) 118/58 (!) 107/47  Pulse: 71 72  Resp: (!) 25 20  Temp:    SpO2: 100% 98%   patient appears in NAD Respiratory: Normal respiratory effort  Review of Systems: Constitutional: negative for fevers and chills  Lab Results  Component Value Date   WBC 7.2 10/19/2023   HGB 10.3 (L) 10/19/2023   HCT 32.2 (L) 10/19/2023   MCV 95.8 10/19/2023   PLT 318 10/19/2023    Lab Results  Component Value Date   CREATININE 0.46 10/19/2023   BUN 19 10/19/2023   NA 128 (L) 10/19/2023   K 3.8 10/19/2023   CL 103 10/19/2023   CO2 20 (L) 10/19/2023    Lab Results  Component Value Date   ALT 49 (H) 10/18/2023   AST 45 (H) 10/18/2023   ALKPHOS 148 (H) 10/18/2023     Microbiology: Recent Results (from the past 240 hour(s))  Aerobic/Anaerobic Culture w Gram Stain (surgical/deep wound)     Status: None (Preliminary result)   Collection Time: 10/12/23  3:44 PM   Specimen: Abscess  Result Value Ref Range Status   Specimen Description   Final    ABSCESS Performed at [facility], [address] [place], [place] [address]    Special Requests   Final    NONE Performed at [facility], [address], [place], [place] [address]    Gram Stain   Final    MODERATE WBC PRESENT, PREDOMINANTLY PMN MODERATE GRAM POSITIVE COCCI FEW GRAM NEGATIVE RODS    Culture   Final    ABUNDANT METHICILLIN RESISTANT STAPHYLOCOCCUS AUREUS NO ANAEROBES ISOLATED Sent to [facility] for further susceptibility testing. Performed at [facility], [address], [place], [place] [address]    Report Status PENDING  Incomplete   Organism ID,  Bacteria METHICILLIN RESISTANT STAPHYLOCOCCUS AUREUS  Final      Susceptibility   Methicillin resistant staphylococcus aureus - MIC*    CIPROFLOXACIN >=8 RESISTANT Resistant     ERYTHROMYCIN >=8 RESISTANT Resistant     GENTAMICIN <=0.5 SENSITIVE Sensitive     OXACILLIN >=4 RESISTANT Resistant     TETRACYCLINE <=1 SENSITIVE Sensitive     VANCOMYCIN 1 SENSITIVE Sensitive     TRIMETH/SULFA >=320 RESISTANT Resistant     CLINDAMYCIN <=0.25 SENSITIVE Sensitive     RIFAMPIN <=0.5 SENSITIVE Sensitive     Inducible Clindamycin NEGATIVE Sensitive     LINEZOLID 2 SENSITIVE Sensitive     * ABUNDANT METHICILLIN RESISTANT STAPHYLOCOCCUS AUREUS  Minimum Inhibitory Conc. (1 Drug)     Status: Abnormal (Preliminary result)   Collection Time: 10/12/23 11:26 PM  Result Value Ref Range Status   Min Inhibitory Conc (1 Drug) Preliminary report (A)  Final    Comment: (NOTE) Performed At: [facility] [address] [place], [place] [id number] Jolene Schimke MD OZ:[id number]    Source CRE PENDING  Incomplete    Impression/Plan:  1. Pelvic abscess - growth with MRSA but suspect polymicrobial.  CT scan noted and resolution of fluid collection.  Drain to remain in place for now.   Will continue antibiotics for now to assure resolution and consider stopping  in 2-3 days.   Dr. Drue Second on over the weekend if needed  I have personally spent 53 minutes involved in face-to-face and non-face-to-face activities for this patient on the day of the visit. Professional time spent includes the following activities: Preparing to see the patient (review of tests), Obtaining and/or reviewing separately obtained history (admission/discharge record), Performing a medically appropriate examination and/or evaluation , Ordering medications/tests/procedures, referring and communicating with other health care professionals, Documenting clinical information in the EMR, Independently interpreting results (not separately  reported), Communicating results to the patient/family/caregiver, Counseling and educating the patient/family/caregiver and Care coordination (not separately reported).

## 2023-10-20 DIAGNOSIS — R1084 Generalized abdominal pain: Secondary | ICD-10-CM | POA: Diagnosis not present

## 2023-10-20 DIAGNOSIS — K56609 Unspecified intestinal obstruction, unspecified as to partial versus complete obstruction: Secondary | ICD-10-CM | POA: Diagnosis not present

## 2023-10-20 DIAGNOSIS — K529 Noninfective gastroenteritis and colitis, unspecified: Secondary | ICD-10-CM | POA: Diagnosis not present

## 2023-10-20 DIAGNOSIS — D500 Iron deficiency anemia secondary to blood loss (chronic): Secondary | ICD-10-CM | POA: Diagnosis not present

## 2023-10-20 LAB — GLUCOSE, CAPILLARY
Glucose-Capillary: 135 mg/dL — ABNORMAL HIGH (ref 70–99)
Glucose-Capillary: 136 mg/dL — ABNORMAL HIGH (ref 70–99)
Glucose-Capillary: 145 mg/dL — ABNORMAL HIGH (ref 70–99)

## 2023-10-20 LAB — CBC
HCT: 31.9 % — ABNORMAL LOW (ref 36.0–46.0)
Hemoglobin: 10.4 g/dL — ABNORMAL LOW (ref 12.0–15.0)
MCH: 31.2 pg (ref 26.0–34.0)
MCHC: 32.6 g/dL (ref 30.0–36.0)
MCV: 95.8 fL (ref 80.0–100.0)
Platelets: 312 10*3/uL (ref 150–400)
RBC: 3.33 MIL/uL — ABNORMAL LOW (ref 3.87–5.11)
RDW: 15.6 % — ABNORMAL HIGH (ref 11.5–15.5)
WBC: 8.6 10*3/uL (ref 4.0–10.5)
nRBC: 0.2 % (ref 0.0–0.2)

## 2023-10-20 LAB — BASIC METABOLIC PANEL
Anion gap: 7 (ref 5–15)
BUN: 17 mg/dL (ref 8–23)
CO2: 20 mmol/L — ABNORMAL LOW (ref 22–32)
Calcium: 7.8 mg/dL — ABNORMAL LOW (ref 8.9–10.3)
Chloride: 98 mmol/L (ref 98–111)
Creatinine, Ser: 0.61 mg/dL (ref 0.44–1.00)
GFR, Estimated: >60 mL/min (ref >60)
Glucose, Bld: 137 mg/dL — ABNORMAL HIGH (ref 70–99)
Potassium: 3.6 mmol/L (ref 3.5–5.1)
Sodium: 125 mmol/L — ABNORMAL LOW (ref 135–145)

## 2023-10-20 LAB — MAGNESIUM: Magnesium: 1.8 mg/dL (ref 1.7–2.4)

## 2023-10-20 LAB — MINIMUM INHIBITORY CONC. (1 DRUG)

## 2023-10-20 LAB — PHOSPHORUS: Phosphorus: 2.3 mg/dL — ABNORMAL LOW (ref 2.5–4.6)

## 2023-10-20 LAB — MIC RESULT

## 2023-10-20 LAB — AEROBIC/ANAEROBIC CULTURE W GRAM STAIN (SURGICAL/DEEP WOUND)

## 2023-10-20 MED ORDER — FAT EMUL FISH OIL/PLANT BASED 20% (SMOFLIPID)IV EMUL
250.0000 mL | INTRAVENOUS | Status: AC
Start: 2023-10-20 — End: 2023-10-21
  Administered 2023-10-20 (×2): 250 mL via INTRAVENOUS
  Filled 2023-10-20: qty 250

## 2023-10-20 MED ORDER — POTASSIUM CHLORIDE 10 MEQ/50ML IV SOLN
10.0000 meq | INTRAVENOUS | Status: AC
  Administered 2023-10-20 (×4): 10 meq via INTRAVENOUS
  Filled 2023-10-20 (×4): qty 50

## 2023-10-20 MED ORDER — SODIUM PHOSPHATES 45 MMOLE/15ML IV SOLN
30.0000 mmol | Freq: Once | INTRAVENOUS | Status: AC
  Administered 2023-10-20: 30 mmol via INTRAVENOUS
  Filled 2023-10-20: qty 10

## 2023-10-20 MED ORDER — MAGNESIUM SULFATE 2 GM/50ML IV SOLN
2.0000 g | Freq: Once | INTRAVENOUS | Status: AC
  Administered 2023-10-20: 2 g via INTRAVENOUS
  Filled 2023-10-20: qty 50

## 2023-10-20 MED ORDER — TRACE MINERALS CU-MN-SE-ZN 300-55-60-3000 MCG/ML IV SOLN
INTRAVENOUS | Status: AC
  Filled 2023-10-20: qty 1000

## 2023-10-20 NOTE — Progress Notes (Signed)
  Daily Progress Note   Patient Name: Elizabeth Odom       Date: 10/20/2023 DOB: [birth year]  Age: 82 y.o. MRN#: [id number] Attending Physician: Cathren Harsh, MD Primary Care Physician: [name], FNP Admit Date: 09/27/2023 Length of Stay: 23 days  PMT has been following along with patient's medical journey. As per EMR review, patient's NG tube removed, diet being progressed to full liquids, and repeat CT imaging shows resolution of abscess and no leak from anastomosis. Continue appropraite medical interventions at this time. Palliative medicine team will continue to follow along peripherally at this time as goals for medical care currently determined. Should acute PMT needs rise, please reach out.  Thank you,   Alvester Morin, DO Palliative Care Provider PMT # [phone]

## 2023-10-20 NOTE — Progress Notes (Signed)
PHARMACY - TOTAL PARENTERAL NUTRITION CONSULT NOTE   Indication:  Prolonged ileus  Patient Measurements: Height: 5\' 5"  (165.1 cm) Weight: 61 kg (134 lb 7.7 oz) IBW/kg (Calculated) : 57 TPN AdjBW (KG): 62 Body mass index is 22.38 kg/m. Usual Weight: 64.5 on 10/04/2023  Assessment:  TPN per Pharmacy consult for this 82 yo female admitted on 09/27/2023 with recurrent abdominal pain, nausea, emesis, diarrhea and intolerance of oral intake. Surgery completed exp lap with small bowel resection and Hartman's resection and colostomy on 10/01/2023.  Patient continues to have postop ileus, managed by general surgery.  NG remains in place to suction. 11/31 imaging showed anastomic leak. 11/1 IR drain catheter placement into pelvic abscess.  11/6 Beginning to have bowel fucntion with colostomy output. NG tube discontinued.  Clear liquids started. 11/9 On full liquids; not taking much po.  Colostomy with minimal output but passing flatus    Glucose / Insulin: No PMH of DM.  Goal CBGs < 150. 24 hr glucose range: 123-145 24 hr units of insulin aspart usage: 3 units Electrolytes:  Na remains low (unable to adjust Na in TPN and also on D10; PO started 11/7 which patient is refusing).   K low at 3.6 and mag low at 1.8 Phos low at 2.3 C02 low.  CorrCa 9.1 - Goal K >= 4, mag >=2, phos ~3 - Unable to custom correct electrolytes in TPN, but will continue to supplement in and outside of TPN. Renal: SCr <1, BUN WNL Hepatic: Alk phos WNL, AST/ALT slightly elevated, Total Bilirubin WNL.  TG WNL (11/4) Intake / Output; MIVF:  Net I/O + 2537.6 mL Drains 10 mL, Urine 925 mL, Stool 5 mL mIVF: D10 @ 40 ml/hr  GI Imaging: 10/10 CT A/P: small bowel enteritis/colitis that was also involving the sigmoid colon. No bowel obstruction seen.  10/17 CT A/P: wall thickening sigmoid colon worrisome for neoplasm. Focal colitis not excluded. Colonic dilation compatible with obstruction.  Cystic mass at the tail of the  pancreas.  10/18 MR - 9 mm cystic lesion within pancreatic body/tail junction, colonic obstruction 10/31 CT A/P - colostomy and Hartmann's pouch seen.  Appears to be extravasation of contrast suggesting perforation or leakage. 11/8 CT A/P: No persistent anastomotic leak or fistula. GI Surgeries / Procedures:  10/19 flexible sigmoidoscopy 10/21 colectomy with colostomy creation/Hartman procedure 11/1: CT guided visceral fluid drain by perc cath  Central access: PICC line placed 11/1 TPN start date: 11/1  Nutritional Goals: Due to severe national shortage of IV fluids related to [facility], pharmacy will utilize best available premix TPN formula to meet at least 80% of protein and 75% of kcal goals  TPN:  Clinimix 8/10, 1L x 5 days per week and 2L x 2 days per week with daily lipids ( ) to provide 103 g protein and an average of 1353 kcal per day D10 at 40 ml/hr provides an additional 326 kcal  Average daily total:  103 g of protein (100% of goal), 1679 kcals (88% of goal)   RD Assessment:  Estimated Needs Total Energy Estimated Needs: 1900-2100 Total Protein Estimated Needs: 90-105g Total Fluid Estimated Needs: 2L/day  Current Nutrition:  Full Liquid TPN  Plan:  Now:  Na-Phos 30 mmol IV x 1 KCl 10 mEq IV q1h x 4 Magnesium 2 g IV x 1  At 1800: Clinimix 8/10 TPN with electrolytes Alternate 2000 mL twice weekly (Monday/Thursday), 1000 mL other days Electrolytes in TPN: Na 35 mEq/L, K 30 mEq/L, Ca  4.5 mEq/L, Mg 5 mEq/L, and Phos 15 mmol/L. Cl:Ac 1:1 Add standard MVI and trace elements to TPN, no chromium due to shortage SMOF Lipid 20% over 12 hours Dextrose 10% IV at 40 ml/hr  Thiamine 100mg  IV daily x 5 days (11/4-11/8) Continue NaCl 1g tablet PO TID (patient refusing) Continue Sensitive SSI q8h and adjust as needed Monitor TPN labs on Mon/Thurs and PRN Follow up ability to tolerate enteral diet and wean TPN when intake is >60% of nutritional needs.      Thank you for allowing pharmacy to be a part of this patient's care.  Selinda Eon, PharmD, BCPS Clinical Pharmacist [facility] 10/20/2023 8:50 AM

## 2023-10-20 NOTE — Plan of Care (Signed)
  Problem: Coping: Goal: Ability to adjust to condition or change in health will improve Outcome: Progressing   Problem: Fluid Volume: Goal: Ability to maintain a balanced intake and output will improve Outcome: Progressing   Problem: Health Behavior/Discharge Planning: Goal: Ability to identify and utilize available resources and services will improve Outcome: Progressing Goal: Ability to manage health-related needs will improve Outcome: Progressing   Problem: Metabolic: Goal: Ability to maintain appropriate glucose levels will improve Outcome: Progressing   Problem: Nutritional: Goal: Maintenance of adequate nutrition will improve Outcome: Progressing Goal: Progress toward achieving an optimal weight will improve Outcome: Progressing   Problem: Skin Integrity: Goal: Risk for impaired skin integrity will decrease Outcome: Progressing   

## 2023-10-20 NOTE — Progress Notes (Signed)
[facility]                                                                              Elizabeth Odom, is a 82 y.o. female, DOB - 03-Mar-1941, ZOX:[id number] Admit date - 09/27/2023    Outpatient Primary MD for the patient is Elizabeth Pilon, FNP  LOS - 23  days  Chief Complaint  Patient presents with   Abdominal Pain       Brief summary   Patient is a 82 year old F with PMH of A-fib, RLS and anemia presented to ED on 10/17 with recurrent abdominal pain, nausea, emesis, diarrhea and intolerance of oral intake.  Patient was seen in ED on 10/10 for abdominal pain, decreased appetite and fever and discharged on Augmentin for possible colitis noted on CT abdomen and pelvis.  However, she had recurrence of symptoms after interval improvement and presented back to the hospital.   CT initially showed proximal sigmoid colon wall thickening with upstream colonic dilation with air-fluid levels and large amount of stool compatible with obstruction.  There is also a 9 mm cystic lesion in the tail of pancreas, and subcentimeter lucent lesion in L1 vertebral body.  GI consulted, patient refused colonoscopy but agreed to sigmoidoscopy showing stricture versus mass at the rectosigmoid region -scope could not be advanced past this position.  Surgery was consulted -status post ex lap with small bowel resection and Hartman's resection and colostomy on 10/01/2023.  Patient continues to have postop ileus, managed by general surgery.  NG remains in place to suction.   Hospital course complicated by acute PE and LLE DVT for which she was started on heparin. She developed profound acute blood loss anemia with subsequent pelvic hematoma near the small bowel anastomosis as well as notable hematuria.  Urology was consulted, signed off after resolution of hematuria. Given high risk of bleed with notable PE/DVT, evaluated by IR and placed IVC filter on 10/05/2023.  Patient required total of 4 unit of  blood transfusion.   Patient continues to work with PT OT, patient will likely require placement given her prolonged hospitalization, weakness, and diminished ambulatory status from baseline.   As her bowel function did not return, she underwent a repeat CT abd and pelvis on 10/31 showing  anastomotic leak into 7 x 6 cm complex fluid collection posteriorly in the pelvis. IR was consulted and underwent CT guided percutaneous drain placement into pelvic abscess.  Fluid culture grew MRSA. ID consulted and she was transitioned to daptomycin and zosyn.    Assessment & Plan    Principal Problem:   Large bowel obstruction (HCC)  -CT abdomen 10/17 with proximal sigmoid colon thickening.  -GI consulted and underwent  sigmoidoscopy 10/19 with tight stricture - was found to have a mass biopsy negative for malignancy as suggesting inflammation.  -Surgery consulted, /p ex lap, hartman's resection on 10/21 with small bowel resection and ostomy (Elizabeth Odom).  - Repeat CT abd pelvis on 10/31 showed a small leak at her anastomosis going into a 6 x 7 cm fluid collection  - IR consulted and underwent drainage catheter placement for pelvic abscess on 11/1.  -  Fluid culture grew MRSA, ID consulted and transitioned to Daptomycin and zosyn. Duration as per ID.  -NGT out. poor PO intake, continue TPN and clears -Repeat CT abdomen 11/7 showed percutaneous pigtail drain catheter, no evidence of persistent anastomotic leak or fistula.   Post  operative ileus vs partial SBO - Exacerbated by intra abd bleeding.  -- Continue TPN, repeat CT abdomen 11/7 showed no persistent anastomotic leak or fistula.  Hyponatremia -Sodium trending down, 125 -On D10 drip, will discontinue, patent is on TPN      Acute blood loss anemia sec to pelvic/ peri anastomotic hematomas with Hematuria -S/p 4 units of prbc transfusions.  -S/p IVC filter  -Urology recommended no indication for cystoscopy or intervention.  -H&H stable     Acute PE with right heart strain with acute left leg DVT Unable to tolerate anticoagulation status post IVC filter placement on 10/25     Nodule in the inf aspect of the isthmus Thyroid US - follow up outpatient.      Paroxysmal atrial fibrillation Rate controlled. -Not on anticoagulation    Hyponatremia:  Nutritional , monitor.    Hypokalemia and hypophosphatemia and hypomagnesemia Replace as needed   Urinary retention:  -UA negative for infection  Hypotension: resolved.     Leukocytosis  Resolved.    Pressure Injury Documentation: Pressure Injury 10/08/23 Buttocks Right;Mid Stage 2 -  Partial thickness loss of dermis presenting as a shallow open injury with a red, pink wound bed without slough. (Active)  10/08/23 0800  Location: Buttocks  Location Orientation: Right;Mid  Staging: Stage 2 -  Partial thickness loss of dermis presenting as a shallow open injury with a red, pink wound bed without slough.  Wound Description (Comments):   Present on Admission:   Dressing Type Foam - Lift dressing to assess site every shift 10/20/23 0800    Moderate protein calorie malnutrition Nutrition Problem: Moderate Malnutrition Etiology: chronic illness Signs/Symptoms: moderate fat depletion, severe muscle depletion Interventions: Refer to RD note for recommendations, TPN  Estimated body mass index is 22.38 kg/m as calculated from the following:   Height as of this encounter: 5\' 5"  (1.651 m).   Weight as of this encounter: 61 kg.  Code Status: DNR DVT Prophylaxis:  Place and maintain sequential compression device Start: 10/06/23 1319   Level of Care: Level of care: Stepdown Family Communication: Updated patient Disposition Plan:      Remains inpatient appropriate: Will need placement at discharge   Procedures:  10/21 s/p ex lap hartman's resection , small bowel resection and ostomy.  IVC filter placement  Consultants:   IR General Surgery Infectious  disease  Antimicrobials:   Anti-infectives (From admission, onward)    Start     Dose/Rate Route Frequency Ordered Stop   10/15/23 1800  vancomycin (VANCOCIN) IVPB 1000 mg/200 mL premix  Status:  Discontinued        1,000 mg 200 mL/hr over 60 Minutes Intravenous Every 24 hours 10/14/23 1740 10/15/23 1045   10/15/23 1430  piperacillin-tazobactam (ZOSYN) IVPB 3.375 g        3.375 g 100 mL/hr over 30 Minutes Intravenous  Once 10/15/23 1343 10/15/23 1503   10/15/23 1400  DAPTOmycin (CUBICIN) IVPB 500 mg/62mL premix        8 mg/kg  64.1 kg 100 mL/hr over 30 Minutes Intravenous Daily 10/15/23 1045     10/14/23 1800  vancomycin (VANCOREADY) IVPB 1250 mg/250 mL        1,250 mg 166.7 mL/hr over 90  Minutes Intravenous  Once 10/14/23 1740 10/14/23 2255   10/14/23 1400  piperacillin-tazobactam (ZOSYN) IVPB 3.375 g        3.375 g 12.5 mL/hr over 240 Minutes Intravenous Every 8 hours 10/14/23 0845     10/14/23 0900  piperacillin-tazobactam (ZOSYN) IVPB 3.375 g        3.375 g 100 mL/hr over 30 Minutes Intravenous NOW 10/14/23 0844 10/14/23 1002   10/05/23 1200  piperacillin-tazobactam (ZOSYN) IVPB 3.375 g        3.375 g 12.5 mL/hr over 240 Minutes Intravenous Every 8 hours 10/05/23 0721 10/09/23 1716   10/04/23 2000  piperacillin-tazobactam (ZOSYN) IVPB 3.375 g  Status:  Discontinued        3.375 g 12.5 mL/hr over 240 Minutes Intravenous Every 8 hours 10/04/23 1827 10/05/23 0721   09/27/23 2315  piperacillin-tazobactam (ZOSYN) IVPB 3.375 g  Status:  Discontinued        3.375 g 12.5 mL/hr over 240 Minutes Intravenous Every 8 hours 09/27/23 2305 10/02/23 1106          Medications  bisacodyl  10 mg Rectal Daily   Chlorhexidine Gluconate Cloth  6 each Topical Daily   insulin aspart  0-9 Units Subcutaneous Q8H   ketotifen  2 drop Both Eyes BID   sodium chloride flush  10-40 mL Intracatheter Q12H   sodium chloride flush  5 mL Intracatheter Q8H   sodium chloride  1 g Oral TID       Subjective:   Alleria Beerman was seen and examined today.  No acute complaints, no significant abdominal pain, nausea vomiting, fevers.  Still poor p.o. intake Objective:   Vitals:   10/20/23 0847 10/20/23 0900 10/20/23 1000 10/20/23 1100  BP:  (!) 123/50 (!) 112/55 (!) 100/51  Pulse:  73 67 62  Resp:  (!) 25 (!) 23 17  Temp: 98.3 F (36.8 C)     TempSrc: Oral     SpO2:  96% 98% 95%  Weight:      Height:        Intake/Output Summary (Last 24 hours) at 10/20/2023 1228 Last data filed at 10/20/2023 1100 Gross per 24 hour  Intake 3123.13 ml  Output 935 ml  Net 2188.13 ml     Wt Readings from Last 3 Encounters:  10/20/23 61 kg  09/20/23 56.2 kg  11/23/20 55.3 kg   Physical Exam General: Alert and oriented, NAD, following commands Cardiovascular: S1 S2 clear, RRR.  Respiratory: CTAB, no wheezing, rales or rhonchi Gastrointestinal: Soft, ND, NBS  Ext: no pedal edema bilaterally Neuro: no new deficits Skin: JP drain+ Psych:    Data Reviewed:  I have personally reviewed following labs    CBC Lab Results  Component Value Date   WBC 8.6 10/20/2023   RBC 3.33 (L) 10/20/2023   HGB 10.4 (L) 10/20/2023   HCT 31.9 (L) 10/20/2023   MCV 95.8 10/20/2023   MCH 31.2 10/20/2023   PLT 312 10/20/2023   MCHC 32.6 10/20/2023   RDW 15.6 (H) 10/20/2023   LYMPHSABS 1.0 10/17/2023   MONOABS 0.9 10/17/2023   EOSABS 0.4 10/17/2023   BASOSABS 0.1 10/17/2023     Last metabolic panel Lab Results  Component Value Date   NA 125 (L) 10/20/2023   K 3.6 10/20/2023   CL 98 10/20/2023   CO2 20 (L) 10/20/2023   BUN 17 10/20/2023   CREATININE 0.61 10/20/2023   GLUCOSE 137 (H) 10/20/2023   GFRNONAA >60 10/20/2023   GFRAA >60 04/10/2016  CALCIUM 7.8 (L) 10/20/2023   PHOS 2.3 (L) 10/20/2023   PROT 6.1 (L) 10/18/2023   ALBUMIN 2.2 (L) 10/18/2023   BILITOT 0.7 10/18/2023   ALKPHOS 148 (H) 10/18/2023   AST 45 (H) 10/18/2023   ALT 49 (H) 10/18/2023   ANIONGAP 7 10/20/2023     CBG (last 3)  Recent Labs    10/19/23 1656 10/19/23 2330 10/20/23 0802  GLUCAP 140* 130* 145*      Coagulation Profile: No results for input(s): "INR", "PROTIME" in the last 168 hours.    Radiology Studies: I have personally reviewed the imaging studies  CT ABDOMEN PELVIS W CONTRAST  Result Date: 10/19/2023 CLINICAL DATA:  Small bowel resection with anastomotic leak and fluid collection, status post percutaneous drain placement, history of colon cancer status post resection * Tracking Code: BO * EXAM: CT ABDOMEN AND PELVIS WITH CONTRAST TECHNIQUE: Multidetector CT imaging of the abdomen and pelvis was performed using the standard protocol following bolus administration of intravenous contrast. RADIATION DOSE REDUCTION: This exam was performed according to the departmental dose-optimization program which includes automated exposure control, adjustment of the mA and/or kV according to patient size and/or use of iterative reconstruction technique. CONTRAST:  OMNIPAQUE IOHEXOL 300 MG/ML  SOLN COMPARISON:  10/11/2023 FINDINGS: Lower chest: Small bilateral pleural effusions and associated atelectasis or consolidation. Hepatobiliary: No solid liver abnormality is seen. No gallstones, gallbladder wall thickening, or biliary dilatation. Pancreas: Unremarkable. No pancreatic ductal dilatation or surrounding inflammatory changes. Spleen: Normal in size without significant abnormality. Adrenals/Urinary Tract: Adrenal glands are unremarkable. Kidneys are normal, without renal calculi, solid lesion, or hydronephrosis. Foley catheter in the bladder. Stomach/Bowel: Stomach is within normal limits. Status post Gertie Gowda procedure sigmoid colon resection with left lower quadrant end colostomy and rectal stump. Descending colonic diverticulosis. Vascular/Lymphatic: Aortic atherosclerosis. Infrarenal IVC filter. No enlarged abdominal or pelvic lymph nodes. Reproductive: Status post hysterectomy. Other:  Status post midline laparotomy. Interval placement of a right posterior approach percutaneous pigtail drain catheter within the low right hemipelvis, with resolution of a previously seen air and fluid collection in this vicinity (series 2, image 63). There is an adjacent small bowel anastomosis containing air and fluid but without enteric contrast (series 2, image 63). Musculoskeletal: No acute or significant osseous findings. IMPRESSION: 1. Interval placement of a right posterior approach percutaneous pigtail drain catheter within the low right hemipelvis, with resolution of a previously seen air and fluid collection in this vicinity. 2. Adjacent small bowel anastomosis containing air and fluid but without enteric contrast. No obvious evidence of persistent anastomotic leak or fistula. 3. Status post midline laparotomy and sigmoid colon resection with left lower quadrant end colostomy and rectal stump. 4. Small bilateral pleural effusions and associated atelectasis or consolidation. 5. Infrarenal IVC filter. Aortic Atherosclerosis (ICD10-I70.0). Electronically Signed   By: Jearld Lesch M.D.   On: 10/19/2023 14:00       Chaos Carlile M.D. [facility] 10/20/2023, 12:28 PM  Available via Epic secure chat 7am-7pm After 7 pm, please refer to night coverage provider listed on amion.   s

## 2023-10-20 NOTE — Progress Notes (Signed)
19 Days Post-Op   Subjective/Chief Complaint: Denies abdominal pain Not taking much po   Objective: Vital signs in last 24 hours: Temp:  [98.5 F (36.9 C)-98.8 F (37.1 C)] 98.6 F (37 C) (11/08 2334) Pulse Rate:  [56-76] 75 (11/09 0700) Resp:  [16-44] 23 (11/09 0700) BP: (99-126)/(43-75) 114/55 (11/09 0700) SpO2:  [86 %-100 %] 96 % (11/09 0700) Weight:  [61 kg] 61 kg (11/09 0500) Last BM Date : 10/16/23  Intake/Output from previous day: 11/08 0701 - 11/09 0700 In: 3472.6 [I.V.:2980.5; IV Piggyback:492.2] Out: 935 [Urine:925; Drains:10] Intake/Output this shift: No intake/output data recorded.  Exam: Awake and alert Follows commands Abdomen soft, wound clean, minimal output in ostomy back JP output looks almost like ongoing bowel leak  Lab Results:  Recent Labs    10/17/23 0815 10/19/23 0414  WBC 8.3 7.2  HGB 9.9* 10.3*  HCT 30.7* 32.2*  PLT 290 318   BMET Recent Labs    10/19/23 0414 10/20/23 0549  NA 128* 125*  K 3.8 3.6  CL 103 98  CO2 20* 20*  GLUCOSE 123* 137*  BUN 19 17  CREATININE 0.46 0.61  CALCIUM 7.7* 7.8*   PT/INR No results for input(s): "LABPROT", "INR" in the last 72 hours. ABG No results for input(s): "PHART", "HCO3" in the last 72 hours.  Invalid input(s): "PCO2", "PO2"  Studies/Results: CT ABDOMEN PELVIS W CONTRAST  Result Date: 10/19/2023 CLINICAL DATA:  Small bowel resection with anastomotic leak and fluid collection, status post percutaneous drain placement, history of colon cancer status post resection * Tracking Code: BO * EXAM: CT ABDOMEN AND PELVIS WITH CONTRAST TECHNIQUE: Multidetector CT imaging of the abdomen and pelvis was performed using the standard protocol following bolus administration of intravenous contrast. RADIATION DOSE REDUCTION: This exam was performed according to the departmental dose-optimization program which includes automated exposure control, adjustment of the mA and/or kV according to patient size  and/or use of iterative reconstruction technique. CONTRAST:  OMNIPAQUE IOHEXOL 300 MG/ML  SOLN COMPARISON:  10/11/2023 FINDINGS: Lower chest: Small bilateral pleural effusions and associated atelectasis or consolidation. Hepatobiliary: No solid liver abnormality is seen. No gallstones, gallbladder wall thickening, or biliary dilatation. Pancreas: Unremarkable. No pancreatic ductal dilatation or surrounding inflammatory changes. Spleen: Normal in size without significant abnormality. Adrenals/Urinary Tract: Adrenal glands are unremarkable. Kidneys are normal, without renal calculi, solid lesion, or hydronephrosis. Foley catheter in the bladder. Stomach/Bowel: Stomach is within normal limits. Status post Gertie Gowda procedure sigmoid colon resection with left lower quadrant end colostomy and rectal stump. Descending colonic diverticulosis. Vascular/Lymphatic: Aortic atherosclerosis. Infrarenal IVC filter. No enlarged abdominal or pelvic lymph nodes. Reproductive: Status post hysterectomy. Other: Status post midline laparotomy. Interval placement of a right posterior approach percutaneous pigtail drain catheter within the low right hemipelvis, with resolution of a previously seen air and fluid collection in this vicinity (series 2, image 63). There is an adjacent small bowel anastomosis containing air and fluid but without enteric contrast (series 2, image 63). Musculoskeletal: No acute or significant osseous findings. IMPRESSION: 1. Interval placement of a right posterior approach percutaneous pigtail drain catheter within the low right hemipelvis, with resolution of a previously seen air and fluid collection in this vicinity. 2. Adjacent small bowel anastomosis containing air and fluid but without enteric contrast. No obvious evidence of persistent anastomotic leak or fistula. 3. Status post midline laparotomy and sigmoid colon resection with left lower quadrant end colostomy and rectal stump. 4. Small bilateral  pleural effusions and associated atelectasis or consolidation.  5. Infrarenal IVC filter. Aortic Atherosclerosis (ICD10-I70.0). Electronically Signed   By: Jearld Lesch M.D.   On: 10/19/2023 14:00    Anti-infectives: Anti-infectives (From admission, onward)    Start     Dose/Rate Route Frequency Ordered Stop   10/15/23 1800  vancomycin (VANCOCIN) IVPB 1000 mg/200 mL premix  Status:  Discontinued        1,000 mg 200 mL/hr over 60 Minutes Intravenous Every 24 hours 10/14/23 1740 10/15/23 1045   10/15/23 1430  piperacillin-tazobactam (ZOSYN) IVPB 3.375 g        3.375 g 100 mL/hr over 30 Minutes Intravenous  Once 10/15/23 1343 10/15/23 1503   10/15/23 1400  DAPTOmycin (CUBICIN) IVPB 500 mg/63mL premix        8 mg/kg  64.1 kg 100 mL/hr over 30 Minutes Intravenous Daily 10/15/23 1045     10/14/23 1800  vancomycin (VANCOREADY) IVPB 1250 mg/250 mL        1,250 mg 166.7 mL/hr over 90 Minutes Intravenous  Once 10/14/23 1740 10/14/23 2255   10/14/23 1400  piperacillin-tazobactam (ZOSYN) IVPB 3.375 g        3.375 g 12.5 mL/hr over 240 Minutes Intravenous Every 8 hours 10/14/23 0845     10/14/23 0900  piperacillin-tazobactam (ZOSYN) IVPB 3.375 g        3.375 g 100 mL/hr over 30 Minutes Intravenous NOW 10/14/23 0844 10/14/23 1002   10/05/23 1200  piperacillin-tazobactam (ZOSYN) IVPB 3.375 g        3.375 g 12.5 mL/hr over 240 Minutes Intravenous Every 8 hours 10/05/23 0721 10/09/23 1716   10/04/23 2000  piperacillin-tazobactam (ZOSYN) IVPB 3.375 g  Status:  Discontinued        3.375 g 12.5 mL/hr over 240 Minutes Intravenous Every 8 hours 10/04/23 1827 10/05/23 0721   09/27/23 2315  piperacillin-tazobactam (ZOSYN) IVPB 3.375 g  Status:  Discontinued        3.375 g 12.5 mL/hr over 240 Minutes Intravenous Every 8 hours 09/27/23 2305 10/02/23 1106       Assessment/Plan:  POD19 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 [name] - CT 10/24 with hematoma in the  pelvis adjacent to small bowel anastomosis and ileus vs obstruction  - IR 11/1: drain placement for small bowel anastomotic leak/ abscess drainage - PICC and TPN.  - WTD midline wound with dakins - on full liquids.  Colostomy with minimal output but passing flatus - drain output 10cc - Drain aspirate growing Staph aureus, abx per ID   --repeat CT 11/8 with resolution of abscess and no leak from anastomosis  Continue to monitor drain output Stay on fulls  Abigail Miyamoto MD 10/20/2023

## 2023-10-21 DIAGNOSIS — R1084 Generalized abdominal pain: Secondary | ICD-10-CM | POA: Diagnosis not present

## 2023-10-21 DIAGNOSIS — K56609 Unspecified intestinal obstruction, unspecified as to partial versus complete obstruction: Secondary | ICD-10-CM | POA: Diagnosis not present

## 2023-10-21 DIAGNOSIS — K529 Noninfective gastroenteritis and colitis, unspecified: Secondary | ICD-10-CM | POA: Diagnosis not present

## 2023-10-21 DIAGNOSIS — D500 Iron deficiency anemia secondary to blood loss (chronic): Secondary | ICD-10-CM | POA: Diagnosis not present

## 2023-10-21 LAB — PHOSPHORUS: Phosphorus: 2.5 mg/dL (ref 2.5–4.6)

## 2023-10-21 LAB — GLUCOSE, CAPILLARY
Glucose-Capillary: 134 mg/dL — ABNORMAL HIGH (ref 70–99)
Glucose-Capillary: 124 mg/dL — ABNORMAL HIGH (ref 70–99)

## 2023-10-21 LAB — BASIC METABOLIC PANEL
Anion gap: 5 (ref 5–15)
BUN: 15 mg/dL (ref 8–23)
CO2: 23 mmol/L (ref 22–32)
Calcium: 8.2 mg/dL — ABNORMAL LOW (ref 8.9–10.3)
Chloride: 103 mmol/L (ref 98–111)
Creatinine, Ser: 0.49 mg/dL (ref 0.44–1.00)
GFR, Estimated: >60 mL/min (ref >60)
Glucose, Bld: 126 mg/dL — ABNORMAL HIGH (ref 70–99)
Potassium: 3.8 mmol/L (ref 3.5–5.1)
Sodium: 131 mmol/L — ABNORMAL LOW (ref 135–145)

## 2023-10-21 LAB — MAGNESIUM: Magnesium: 2.1 mg/dL (ref 1.7–2.4)

## 2023-10-21 MED ORDER — ACETAMINOPHEN 650 MG RE SUPP: 650.0000 mg | Freq: Four times a day (QID) | RECTAL | Status: DC | PRN

## 2023-10-21 MED ORDER — POTASSIUM PHOSPHATES 15 MMOLE/5ML IV SOLN
15.0000 mmol | Freq: Once | INTRAVENOUS | Status: AC
  Administered 2023-10-21: 15 mmol via INTRAVENOUS
  Filled 2023-10-21: qty 5

## 2023-10-21 MED ORDER — FAT EMUL FISH OIL/PLANT BASED 20% (SMOFLIPID)IV EMUL
250.0000 mL | INTRAVENOUS | Status: AC
Start: 2023-10-21 — End: 2023-10-22
  Administered 2023-10-21: 250 mL via INTRAVENOUS
  Filled 2023-10-21: qty 250

## 2023-10-21 MED ORDER — TRACE MINERALS CU-MN-SE-ZN 300-55-60-3000 MCG/ML IV SOLN
INTRAVENOUS | Status: AC
  Filled 2023-10-21: qty 1000

## 2023-10-21 MED ORDER — ACETAMINOPHEN 160 MG/5ML PO SOLN
650.0000 mg | Freq: Four times a day (QID) | ORAL | Status: DC | PRN
  Administered 2023-10-21: 650 mg via ORAL
  Filled 2023-10-21: qty 20.3

## 2023-10-21 NOTE — Progress Notes (Signed)
[facility]                                                                              Elizabeth Odom, is a 82 y.o. female, DOB - May 27, 1941, WGN:[id number] Admit date - 09/27/2023    Outpatient Primary MD for the patient is Elizabeth Pilon, FNP  LOS - 24  days  Chief Complaint  Patient presents with   Abdominal Pain       Brief summary   Patient is a 82 year old F with PMH of A-fib, RLS and anemia presented to ED on 10/17 with recurrent abdominal pain, nausea, emesis, diarrhea and intolerance of oral intake.  Patient was seen in ED on 10/10 for abdominal pain, decreased appetite and fever and discharged on Augmentin for possible colitis noted on CT abdomen and pelvis.  However, she had recurrence of symptoms after interval improvement and presented back to the hospital.   CT initially showed proximal sigmoid colon wall thickening with upstream colonic dilation with air-fluid levels and large amount of stool compatible with obstruction.  There is also a 9 mm cystic lesion in the tail of pancreas, and subcentimeter lucent lesion in L1 vertebral body.  GI consulted, patient refused colonoscopy but agreed to sigmoidoscopy showing stricture versus mass at the rectosigmoid region -scope could not be advanced past this position.  Surgery was consulted -status post ex lap with small bowel resection and Hartman's resection and colostomy on 10/01/2023.  Patient continues to have postop ileus, managed by general surgery.  NG remains in place to suction.   Hospital course complicated by acute PE and LLE DVT for which she was started on heparin. She developed profound acute blood loss anemia with subsequent pelvic hematoma near the small bowel anastomosis as well as notable hematuria.  Urology was consulted, signed off after resolution of hematuria. Given high risk of bleed with notable PE/DVT, evaluated by IR and placed IVC filter on 10/05/2023.  Patient required total of 4 unit of  blood transfusion.   Patient continues to work with PT OT, patient will likely require placement given her prolonged hospitalization, weakness, and diminished ambulatory status from baseline.   As her bowel function did not return, she underwent a repeat CT abd and pelvis on 10/31 showing  anastomotic leak into 7 x 6 cm complex fluid collection posteriorly in the pelvis. IR was consulted and underwent CT guided percutaneous drain placement into pelvic abscess.  Fluid culture grew MRSA. ID consulted and she was transitioned to daptomycin and zosyn.    Assessment & Plan    Principal Problem:   Large bowel obstruction (HCC)  -CT abdomen 10/17 with proximal sigmoid colon thickening.  -GI consulted and underwent  sigmoidoscopy 10/19 with tight stricture - was found to have a mass biopsy negative for malignancy as suggesting inflammation.  -Surgery consulted, /p ex lap, hartman's resection on 10/21 with small bowel resection and ostomy ([name]).  - Repeat CT abd pelvis on 10/31 showed a small leak at her anastomosis going into a 6 x 7 cm fluid collection  - IR consulted and underwent drainage catheter placement for pelvic abscess on 11/1.  -  Fluid culture grew MRSA, ID consulted and transitioned to Daptomycin and zosyn. Duration as per ID.  -Repeat CT abdomen 11/7 showed percutaneous pigtail drain catheter, no evidence of persistent anastomotic leak or fistula. -Continue TPN, on full liquid diet, surgery following -Start mobilizing, PT OT   Post  operative ileus vs partial SBO - Exacerbated by intra abd bleeding.  -- Continue TPN, repeat CT abdomen 11/7 showed no persistent anastomotic leak or fistula.  Hyponatremia -Improving, D10 drip discontinued -Sodium 131, on PPN    Acute blood loss anemia sec to pelvic/ peri anastomotic hematomas with Hematuria -S/p 4 units of prbc transfusions.  -S/p IVC filter  -Urology recommended no indication for cystoscopy or intervention.  -H&H stable     Acute PE with right heart strain with acute left leg DVT Unable to tolerate anticoagulation status post IVC filter placement on 10/25     Nodule in the inf aspect of the isthmus Thyroid US - follow up outpatient.      Paroxysmal atrial fibrillation Rate controlled. -Not on anticoagulation    Hypokalemia and hypophosphatemia and hypomagnesemia Replace as needed   Urinary retention:  -UA negative for infection  Hypotension: resolved.     Leukocytosis  Resolved.    Pressure Injury Documentation: Pressure Injury 10/08/23 Buttocks Right;Mid Stage 2 -  Partial thickness loss of dermis presenting as a shallow open injury with a red, pink wound bed without slough. (Active)  10/08/23 0800  Location: Buttocks  Location Orientation: Right;Mid  Staging: Stage 2 -  Partial thickness loss of dermis presenting as a shallow open injury with a red, pink wound bed without slough.  Wound Description (Comments):   Present on Admission:   Dressing Type Foam - Lift dressing to assess site every shift 10/21/23 1000    Moderate protein calorie malnutrition Nutrition Problem: Moderate Malnutrition Etiology: chronic illness Signs/Symptoms: moderate fat depletion, severe muscle depletion Interventions: Refer to RD note for recommendations, TPN  Estimated body mass index is 22.38 kg/m as calculated from the following:   Height as of this encounter: 5\' 5"  (1.651 m).   Weight as of this encounter: 61 kg.  Code Status: DNR DVT Prophylaxis:  Place and maintain sequential compression device Start: 10/06/23 1319   Level of Care: Level of care: Telemetry Family Communication: Updated patient Disposition Plan:      Remains inpatient appropriate: Will need placement at discharge.  Transfer to telemetry floor.   Procedures:  10/21 s/p ex lap hartman's resection , small bowel resection and ostomy.  IVC filter placement  Consultants:   IR General Surgery Infectious disease  Antimicrobials:    Anti-infectives (From admission, onward)    Start     Dose/Rate Route Frequency Ordered Stop   10/15/23 1800  vancomycin (VANCOCIN) IVPB 1000 mg/200 mL premix  Status:  Discontinued        1,000 mg 200 mL/hr over 60 Minutes Intravenous Every 24 hours 10/14/23 1740 10/15/23 1045   10/15/23 1430  piperacillin-tazobactam (ZOSYN) IVPB 3.375 g        3.375 g 100 mL/hr over 30 Minutes Intravenous  Once 10/15/23 1343 10/15/23 1503   10/15/23 1400  DAPTOmycin (CUBICIN) IVPB 500 mg/81mL premix        8 mg/kg  64.1 kg 100 mL/hr over 30 Minutes Intravenous Daily 10/15/23 1045     10/14/23 1800  vancomycin (VANCOREADY) IVPB 1250 mg/250 mL        1,250 mg 166.7 mL/hr over 90 Minutes Intravenous  Once 10/14/23 1740 10/14/23  2255   10/14/23 1400  piperacillin-tazobactam (ZOSYN) IVPB 3.375 g        3.375 g 12.5 mL/hr over 240 Minutes Intravenous Every 8 hours 10/14/23 0845     10/14/23 0900  piperacillin-tazobactam (ZOSYN) IVPB 3.375 g        3.375 g 100 mL/hr over 30 Minutes Intravenous NOW 10/14/23 0844 10/14/23 1002   10/05/23 1200  piperacillin-tazobactam (ZOSYN) IVPB 3.375 g        3.375 g 12.5 mL/hr over 240 Minutes Intravenous Every 8 hours 10/05/23 0721 10/09/23 1716   10/04/23 2000  piperacillin-tazobactam (ZOSYN) IVPB 3.375 g  Status:  Discontinued        3.375 g 12.5 mL/hr over 240 Minutes Intravenous Every 8 hours 10/04/23 1827 10/05/23 0721   09/27/23 2315  piperacillin-tazobactam (ZOSYN) IVPB 3.375 g  Status:  Discontinued        3.375 g 12.5 mL/hr over 240 Minutes Intravenous Every 8 hours 09/27/23 2305 10/02/23 1106          Medications  bisacodyl  10 mg Rectal Daily   Chlorhexidine Gluconate Cloth  6 each Topical Daily   insulin aspart  0-9 Units Subcutaneous Q8H   ketotifen  2 drop Both Eyes BID   sodium chloride flush  10-40 mL Intracatheter Q12H   sodium chloride flush  5 mL Intracatheter Q8H      Subjective:   Elizabeth Odom was seen and examined today.  No  complaints, appears comfortable.  No output from drain.  Appears very deconditioned, poor p.o. intake.  Objective:   Vitals:   10/21/23 0850 10/21/23 0900 10/21/23 1004 10/21/23 1211  BP:  (!) 102/49 102/68 (!) 100/55  Pulse:  74 60 61  Resp:  (!) 22 17 19   Temp: 98.3 F (36.8 C)  98.7 F (37.1 C) 98 F (36.7 C)  TempSrc: Oral  Oral   SpO2:  96% 98% 97%  Weight:      Height:        Intake/Output Summary (Last 24 hours) at 10/21/2023 1235 Last data filed at 10/21/2023 0933 Gross per 24 hour  Intake 1488.77 ml  Output 2025 ml  Net -536.23 ml     Wt Readings from Last 3 Encounters:  10/21/23 61 kg  09/20/23 56.2 kg  11/23/20 55.3 kg    Physical Exam General: Sleepy but arousable, NAD and oriented Cardiovascular: S1 S2 clear, RRR.  Respiratory: CTAB Gastrointestinal: Soft,  ostomy with minimal stool Ext: no pedal edema bilaterally Psych: sleepy   Data Reviewed:  I have personally reviewed following labs    CBC Lab Results  Component Value Date   WBC 8.6 10/20/2023   RBC 3.33 (L) 10/20/2023   HGB 10.4 (L) 10/20/2023   HCT 31.9 (L) 10/20/2023   MCV 95.8 10/20/2023   MCH 31.2 10/20/2023   PLT 312 10/20/2023   MCHC 32.6 10/20/2023   RDW 15.6 (H) 10/20/2023   LYMPHSABS 1.0 10/17/2023   MONOABS 0.9 10/17/2023   EOSABS 0.4 10/17/2023   BASOSABS 0.1 10/17/2023     Last metabolic panel Lab Results  Component Value Date   NA 131 (L) 10/21/2023   K 3.8 10/21/2023   CL 103 10/21/2023   CO2 23 10/21/2023   BUN 15 10/21/2023   CREATININE 0.49 10/21/2023   GLUCOSE 126 (H) 10/21/2023   GFRNONAA >60 10/21/2023   GFRAA >60 04/10/2016   CALCIUM 8.2 (L) 10/21/2023   PHOS 2.5 10/21/2023   PROT 6.1 (L) 10/18/2023   ALBUMIN 2.2 (  L) 10/18/2023   BILITOT 0.7 10/18/2023   ALKPHOS 148 (H) 10/18/2023   AST 45 (H) 10/18/2023   ALT 49 (H) 10/18/2023   ANIONGAP 5 10/21/2023    CBG (last 3)  Recent Labs    10/20/23 1557 10/20/23 2340 10/21/23 0817  GLUCAP  135* 136* 134*      Coagulation Profile: No results for input(s): "INR", "PROTIME" in the last 168 hours.    Radiology Studies: I have personally reviewed the imaging studies  No results found.     Thad Ranger M.D. [facility] 10/21/2023, 12:35 PM  Available via Epic secure chat 7am-7pm After 7 pm, please refer to night coverage provider listed on amion.   s

## 2023-10-21 NOTE — Progress Notes (Signed)
20 Days Post-Op   Subjective/Chief Complaint: No acute changes over night No output from drain  Objective: Vital signs in last 24 hours: Temp:  [97.2 F (36.2 C)-98.4 F (36.9 C)] 97.4 F (36.3 C) (11/10 0351) Pulse Rate:  [57-73] 57 (11/09 1900) Resp:  [16-25] 21 (11/09 1900) BP: (97-128)/(45-65) 127/62 (11/09 1900) SpO2:  [95 %-98 %] 97 % (11/09 1900) Weight:  [61 kg] 61 kg (11/10 0500) Last BM Date : 10/20/23  Intake/Output from previous day: 11/09 0701 - 11/10 0700 In: 1336.3 [I.V.:695.2; IV Piggyback:641.1] Out: 850 [Urine:850] Intake/Output this shift: No intake/output data recorded.  Exam; Sleeping but will wake up Looks comfortable Abdomen soft, drain output thinner and does NOT look like ongoing fistula Ostomy with minimal stool in the bag  Lab Results:  Recent Labs    10/19/23 0414 10/20/23 0549  WBC 7.2 8.6  HGB 10.3* 10.4*  HCT 32.2* 31.9*  PLT 318 312   BMET Recent Labs    10/20/23 0549 10/21/23 0400  NA 125* 131*  K 3.6 3.8  CL 98 103  CO2 20* 23  GLUCOSE 137* 126*  BUN 17 15  CREATININE 0.61 0.49  CALCIUM 7.8* 8.2*   PT/INR No results for input(s): "LABPROT", "INR" in the last 72 hours. ABG No results for input(s): "PHART", "HCO3" in the last 72 hours.  Invalid input(s): "PCO2", "PO2"  Studies/Results: CT ABDOMEN PELVIS W CONTRAST  Result Date: 10/19/2023 CLINICAL DATA:  Small bowel resection with anastomotic leak and fluid collection, status post percutaneous drain placement, history of colon cancer status post resection * Tracking Code: BO * EXAM: CT ABDOMEN AND PELVIS WITH CONTRAST TECHNIQUE: Multidetector CT imaging of the abdomen and pelvis was performed using the standard protocol following bolus administration of intravenous contrast. RADIATION DOSE REDUCTION: This exam was performed according to the departmental dose-optimization program which includes automated exposure control, adjustment of the mA and/or kV according to  patient size and/or use of iterative reconstruction technique. CONTRAST:  OMNIPAQUE IOHEXOL 300 MG/ML  SOLN COMPARISON:  10/11/2023 FINDINGS: Lower chest: Small bilateral pleural effusions and associated atelectasis or consolidation. Hepatobiliary: No solid liver abnormality is seen. No gallstones, gallbladder wall thickening, or biliary dilatation. Pancreas: Unremarkable. No pancreatic ductal dilatation or surrounding inflammatory changes. Spleen: Normal in size without significant abnormality. Adrenals/Urinary Tract: Adrenal glands are unremarkable. Kidneys are normal, without renal calculi, solid lesion, or hydronephrosis. Foley catheter in the bladder. Stomach/Bowel: Stomach is within normal limits. Status post Gertie Gowda procedure sigmoid colon resection with left lower quadrant end colostomy and rectal stump. Descending colonic diverticulosis. Vascular/Lymphatic: Aortic atherosclerosis. Infrarenal IVC filter. No enlarged abdominal or pelvic lymph nodes. Reproductive: Status post hysterectomy. Other: Status post midline laparotomy. Interval placement of a right posterior approach percutaneous pigtail drain catheter within the low right hemipelvis, with resolution of a previously seen air and fluid collection in this vicinity (series 2, image 63). There is an adjacent small bowel anastomosis containing air and fluid but without enteric contrast (series 2, image 63). Musculoskeletal: No acute or significant osseous findings. IMPRESSION: 1. Interval placement of a right posterior approach percutaneous pigtail drain catheter within the low right hemipelvis, with resolution of a previously seen air and fluid collection in this vicinity. 2. Adjacent small bowel anastomosis containing air and fluid but without enteric contrast. No obvious evidence of persistent anastomotic leak or fistula. 3. Status post midline laparotomy and sigmoid colon resection with left lower quadrant end colostomy and rectal stump. 4.  Small bilateral pleural effusions and  associated atelectasis or consolidation. 5. Infrarenal IVC filter. Aortic Atherosclerosis (ICD10-I70.0). Electronically Signed   By: Jearld Lesch M.D.   On: 10/19/2023 14:00    Anti-infectives: Anti-infectives (From admission, onward)    Start     Dose/Rate Route Frequency Ordered Stop   10/15/23 1800  vancomycin (VANCOCIN) IVPB 1000 mg/200 mL premix  Status:  Discontinued        1,000 mg 200 mL/hr over 60 Minutes Intravenous Every 24 hours 10/14/23 1740 10/15/23 1045   10/15/23 1430  piperacillin-tazobactam (ZOSYN) IVPB 3.375 g        3.375 g 100 mL/hr over 30 Minutes Intravenous  Once 10/15/23 1343 10/15/23 1503   10/15/23 1400  DAPTOmycin (CUBICIN) IVPB 500 mg/31mL premix        8 mg/kg  64.1 kg 100 mL/hr over 30 Minutes Intravenous Daily 10/15/23 1045     10/14/23 1800  vancomycin (VANCOREADY) IVPB 1250 mg/250 mL        1,250 mg 166.7 mL/hr over 90 Minutes Intravenous  Once 10/14/23 1740 10/14/23 2255   10/14/23 1400  piperacillin-tazobactam (ZOSYN) IVPB 3.375 g        3.375 g 12.5 mL/hr over 240 Minutes Intravenous Every 8 hours 10/14/23 0845     10/14/23 0900  piperacillin-tazobactam (ZOSYN) IVPB 3.375 g        3.375 g 100 mL/hr over 30 Minutes Intravenous NOW 10/14/23 0844 10/14/23 1002   10/05/23 1200  piperacillin-tazobactam (ZOSYN) IVPB 3.375 g        3.375 g 12.5 mL/hr over 240 Minutes Intravenous Every 8 hours 10/05/23 0721 10/09/23 1716   10/04/23 2000  piperacillin-tazobactam (ZOSYN) IVPB 3.375 g  Status:  Discontinued        3.375 g 12.5 mL/hr over 240 Minutes Intravenous Every 8 hours 10/04/23 1827 10/05/23 0721   09/27/23 2315  piperacillin-tazobactam (ZOSYN) IVPB 3.375 g  Status:  Discontinued        3.375 g 12.5 mL/hr over 240 Minutes Intravenous Every 8 hours 09/27/23 2305 10/02/23 1106       Assessment/Plan: POD 20 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 [name] - CT 10/24 with  hematoma in the pelvis adjacent to small bowel anastomosis and ileus vs obstruction  - IR 11/1: drain placement for small bowel anastomotic leak/ abscess drainage - PICC and TPN.  - WTD midline wound with dakins - on full liquids.  Colostomy with minimal output but passing flatus - no output from drain - Drain aspirate growing Staph aureus, abx per ID  Seeing what is in the bulb today, I do not think there is still a leak from the anastomosis.  Will leave it in at least another day Continue current care Remains deconditioned  Abigail Miyamoto MD 10/21/2023

## 2023-10-21 NOTE — Plan of Care (Signed)

## 2023-10-21 NOTE — Progress Notes (Signed)
PHARMACY - TOTAL PARENTERAL NUTRITION CONSULT NOTE   Indication:  Prolonged ileus  Patient Measurements: Height: 5\' 5"  (165.1 cm) Weight: 61 kg (134 lb 7.7 oz) IBW/kg (Calculated) : 57 TPN AdjBW (KG): 62 Body mass index is 22.38 kg/m. Usual Weight: 64.5 on 10/04/2023  Assessment:  TPN per Pharmacy consult for this 82 yo female admitted on 09/27/2023 with recurrent abdominal pain, nausea, emesis, diarrhea and intolerance of oral intake. Surgery completed exp lap with small bowel resection and Hartman's resection and colostomy on 10/01/2023.  Patient continues to have postop ileus, managed by general surgery.  NG remains in place to suction. 11/31 imaging showed anastomic leak. 11/1 IR drain catheter placement into pelvic abscess.  11/6 Beginning to have bowel fucntion with colostomy output. NG tube discontinued.  Clear liquids started. 11/9 On full liquids; not taking much po.  Colostomy with minimal output but passing flatus  11/10 No output from drain. Surgery planning to leave in place at least another day.  Glucose / Insulin: No PMH of DM.  Goal CBGs < 150. 24 hr glucose range: 126-145 24 hr units of insulin aspart usage: 3 units Electrolytes:  Na remains low (unable to adjust Na in TPN and was also on D10; PO started 11/7 which patient is refusing) but improved up to 131 (D10 discontinued yesterday).   K improved up to 3.8 (low end of normal limits, below goal) and mag now improved to WNL Phos improved to 2.5 (low end of normal limits, below goal) C02 improved to WNL.  CorrCa 9.96 - Goal K >= 4, mag >=2, phos ~3 - Unable to custom correct electrolytes in TPN, but will continue to supplement in and outside of TPN. Renal: SCr <1, BUN WNL Hepatic: Alk phos WNL, AST/ALT slightly elevated, Total Bilirubin WNL.  TG WNL (11/4) Intake / Output; MIVF:  Net I/O + 1283 mL Drains 0 mL, Urine 850 mL, Stool 0 mL mIVF: none  GI Imaging: 10/10 CT A/P: small bowel enteritis/colitis that was  also involving the sigmoid colon. No bowel obstruction seen.  10/17 CT A/P: wall thickening sigmoid colon worrisome for neoplasm. Focal colitis not excluded. Colonic dilation compatible with obstruction.  Cystic mass at the tail of the pancreas.  10/18 MR - 9 mm cystic lesion within pancreatic body/tail junction, colonic obstruction 10/31 CT A/P - colostomy and Hartmann's pouch seen.  Appears to be extravasation of contrast suggesting perforation or leakage. 11/8 CT A/P: No persistent anastomotic leak or fistula. GI Surgeries / Procedures:  10/19 flexible sigmoidoscopy 10/21 colectomy with colostomy creation/Hartman procedure 11/1: CT guided visceral fluid drain by perc cath  Central access: PICC line placed 11/1 TPN start date: 11/1  Nutritional Goals: Due to severe national shortage of IV fluids related to [facility] in [facility], pharmacy will utilize best available premix TPN formula to meet at least 80% of protein and 75% of kcal goals  TPN:  Clinimix 8/10, 1L x 5 days per week and 2L x 2 days per week with daily lipids ( ) to provide 103 g protein and an average of 1353 kcal per day D10 at 40 ml/hr provides an additional 326 kcal - d/c'd 11/9 Average daily total:  103 g of protein (100% of goal), 1679 kcals (88% of goal)   RD Assessment:  Estimated Needs Total Energy Estimated Needs: 1900-2100 Total Protein Estimated Needs: 90-105g Total Fluid Estimated Needs: 2L/day  Current Nutrition:  Full Liquid TPN  Plan:  Now:  K-Phos 15 mmol IV x 1  At 1800: Clinimix 8/10 TPN with electrolytes Alternate 2000 mL twice weekly (Monday/Thursday), 1000 mL other days Electrolytes in TPN: Na 35 mEq/L, K 30 mEq/L, Ca 4.5 mEq/L, Mg 5 mEq/L, and Phos 15 mmol/L. Cl:Ac 1:1 Add standard MVI and trace elements to TPN, no chromium due to shortage SMOF Lipid 20% over 12 hours Discontinue NaCl 1g tablet PO TID Continue Sensitive SSI q8h and adjust as needed Monitor TPN labs on Mon/Thurs  and PRN Follow up ability to tolerate enteral diet and wean TPN when intake is >60% of nutritional needs.     Thank you for allowing pharmacy to be a part of this patient's care.  Selinda Eon, PharmD, BCPS Clinical Pharmacist [place] 10/21/2023 8:52 AM

## 2023-10-22 ENCOUNTER — Inpatient Hospital Stay (HOSPITAL_COMMUNITY): Payer: PPO

## 2023-10-22 DIAGNOSIS — K56609 Unspecified intestinal obstruction, unspecified as to partial versus complete obstruction: Secondary | ICD-10-CM | POA: Diagnosis not present

## 2023-10-22 DIAGNOSIS — K529 Noninfective gastroenteritis and colitis, unspecified: Secondary | ICD-10-CM | POA: Diagnosis not present

## 2023-10-22 DIAGNOSIS — R1084 Generalized abdominal pain: Secondary | ICD-10-CM | POA: Diagnosis not present

## 2023-10-22 DIAGNOSIS — D500 Iron deficiency anemia secondary to blood loss (chronic): Secondary | ICD-10-CM | POA: Diagnosis not present

## 2023-10-22 DIAGNOSIS — K651 Peritoneal abscess: Secondary | ICD-10-CM | POA: Diagnosis not present

## 2023-10-22 HISTORY — PX: IR SINUS/FIST TUBE CHK-NON GI: IMG673

## 2023-10-22 LAB — COMPREHENSIVE METABOLIC PANEL
ALT: 59 U/L — ABNORMAL HIGH (ref 0–44)
AST: 48 U/L — ABNORMAL HIGH (ref 15–41)
Albumin: 2.1 g/dL — ABNORMAL LOW (ref 3.5–5.0)
Alkaline Phosphatase: 205 U/L — ABNORMAL HIGH (ref 38–126)
Anion gap: 6 (ref 5–15)
BUN: 18 mg/dL (ref 8–23)
CO2: 22 mmol/L (ref 22–32)
Calcium: 8.1 mg/dL — ABNORMAL LOW (ref 8.9–10.3)
Chloride: 100 mmol/L (ref 98–111)
Creatinine, Ser: 0.65 mg/dL (ref 0.44–1.00)
GFR, Estimated: >60 mL/min (ref >60)
Glucose, Bld: 121 mg/dL — ABNORMAL HIGH (ref 70–99)
Potassium: 3.7 mmol/L (ref 3.5–5.1)
Sodium: 128 mmol/L — ABNORMAL LOW (ref 135–145)
Total Bilirubin: 0.6 mg/dL (ref <1.2)
Total Protein: 6.2 g/dL — ABNORMAL LOW (ref 6.5–8.1)

## 2023-10-22 LAB — GLUCOSE, CAPILLARY
Glucose-Capillary: 106 mg/dL — ABNORMAL HIGH (ref 70–99)
Glucose-Capillary: 112 mg/dL — ABNORMAL HIGH (ref 70–99)
Glucose-Capillary: 116 mg/dL — ABNORMAL HIGH (ref 70–99)
Glucose-Capillary: 124 mg/dL — ABNORMAL HIGH (ref 70–99)
Glucose-Capillary: 130 mg/dL — ABNORMAL HIGH (ref 70–99)

## 2023-10-22 LAB — MAGNESIUM: Magnesium: 1.9 mg/dL (ref 1.7–2.4)

## 2023-10-22 LAB — TRIGLYCERIDES: Triglycerides: 273 mg/dL — ABNORMAL HIGH (ref <150)

## 2023-10-22 LAB — PHOSPHORUS: Phosphorus: 2.9 mg/dL (ref 2.5–4.6)

## 2023-10-22 MED ORDER — DOCUSATE SODIUM 100 MG PO CAPS
100.0000 mg | ORAL_CAPSULE | Freq: Two times a day (BID) | ORAL | Status: DC
  Filled 2023-10-22: qty 1

## 2023-10-22 MED ORDER — IOHEXOL 300 MG/ML  SOLN
50.0000 mL | Freq: Once | INTRAMUSCULAR | Status: AC | PRN
  Administered 2023-10-22: 10 mL

## 2023-10-22 MED ORDER — BOOST PLUS PO LIQD
237.0000 mL | Freq: Two times a day (BID) | ORAL | Status: DC
  Administered 2023-10-22 – 2023-10-23 (×2): 237 mL via ORAL
  Filled 2023-10-22 (×7): qty 237

## 2023-10-22 MED ORDER — DOCUSATE SODIUM 50 MG/5ML PO LIQD
100.0000 mg | Freq: Two times a day (BID) | ORAL | Status: DC
  Administered 2023-10-22 – 2023-10-25 (×4): 100 mg via ORAL
  Filled 2023-10-22 (×7): qty 10

## 2023-10-22 MED ORDER — TRACE MINERALS CU-MN-SE-ZN 300-55-60-3000 MCG/ML IV SOLN
INTRAVENOUS | Status: AC
  Filled 2023-10-22 (×2): qty 1968

## 2023-10-22 MED ORDER — FAT EMUL FISH OIL/PLANT BASED 20% (SMOFLIPID)IV EMUL
250.0000 mL | INTRAVENOUS | Status: AC
  Administered 2023-10-22: 250 mL via INTRAVENOUS
  Filled 2023-10-22: qty 250

## 2023-10-22 MED ORDER — POTASSIUM CHLORIDE 10 MEQ/50ML IV SOLN
10.0000 meq | INTRAVENOUS | Status: AC
  Administered 2023-10-22 (×2): 10 meq via INTRAVENOUS
  Filled 2023-10-22 (×2): qty 50

## 2023-10-22 NOTE — Progress Notes (Signed)
Progress Note  21 Days Post-Op  Subjective: Pt reports some abdominal pain after moving from bed to CT and back to bed. Just had ostomy appliance changed - WOC RN reported hard stool ball was present. Pt tolerating liquids without n/v.   Objective: Vital signs in last 24 hours: Temp:  [98.2 F (36.8 C)-100.4 F (38 C)] 98.2 F (36.8 C) (11/11 1311) Pulse Rate:  [62-73] 73 (11/11 1311) Resp:  [16-19] 16 (11/11 1311) BP: (94-118)/(57-79) 94/57 (11/11 1311) SpO2:  [95 %-96 %] 95 % (11/11 1311) Last BM Date : 10/20/23  Intake/Output from previous day: 11/10 0701 - 11/11 0700 In: 1380.2 [P.O.:390; I.V.:709.5; IV Piggyback:280.7] Out: 3735 [Urine:3725; Drains:10] Intake/Output this shift: Total I/O In: 0  Out: 400 [Urine:400]  PE: General: pleasant, WD, thin female who is laying in bed in NAD Heart: regular, rate, and rhythm.   Lungs: Respiratory effort nonlabored Abd: midline wound clean with granulation tissue, JP bulb with scant murky drainage, ND, stoma viable with new ostomy appliance Psych: A&Ox3 with an appropriate affect.    Lab Results:  Recent Labs    10/20/23 0549  WBC 8.6  HGB 10.4*  HCT 31.9*  PLT 312   BMET Recent Labs    10/21/23 0400 10/22/23 0354  NA 131* 128*  K 3.8 3.7  CL 103 100  CO2 23 22  GLUCOSE 126* 121*  BUN 15 18  CREATININE 0.49 0.65  CALCIUM 8.2* 8.1*   PT/INR No results for input(s): "LABPROT", "INR" in the last 72 hours. CMP     Component Value Date/Time   NA 128 (L) 10/22/2023 0354   K 3.7 10/22/2023 0354   CL 100 10/22/2023 0354   CO2 22 10/22/2023 0354   GLUCOSE 121 (H) 10/22/2023 0354   BUN 18 10/22/2023 0354   CREATININE 0.65 10/22/2023 0354   CALCIUM 8.1 (L) 10/22/2023 0354   PROT 6.2 (L) 10/22/2023 0354   ALBUMIN 2.1 (L) 10/22/2023 0354   AST 48 (H) 10/22/2023 0354   ALT 59 (H) 10/22/2023 0354   ALKPHOS 205 (H) 10/22/2023 0354   BILITOT 0.6 10/22/2023 0354   GFRNONAA >60 10/22/2023 0354   GFRAA >60  04/10/2016 1352   Lipase     Component Value Date/Time   LIPASE 41 09/27/2023 2007       Studies/Results: DG Pelvis Portable  Result Date: 10/22/2023 CLINICAL DATA:  [id number] Pelvic abscess in female [id number] EXAM: PORTABLE PELVIS 1-2 VIEWS COMPARISON:  IR drain injection, concurrent. CT AP, 10/19/2023 and 10/11/2023. IR CT, 10/12/2023. FINDINGS: RIGHT pelvic drainage catheter with pigtail tip projecting near midline and lateral to the anastomotic staple line. Contrast opacification along the colon and line. Prior intraluminal contrast opacification of ascending and transverse colon. No interval osseous abnormality. IMPRESSION: RIGHT pelvic drainage catheter with contrast opacification of the colon and staple line. Findings highly suspicious for fistulous communication with anastomosis. Electronically Signed   By: Roanna Banning M.D.   On: 10/22/2023 11:21    Anti-infectives: Anti-infectives (From admission, onward)    Start     Dose/Rate Route Frequency Ordered Stop   10/15/23 1800  vancomycin (VANCOCIN) IVPB 1000 mg/200 mL premix  Status:  Discontinued        1,000 mg 200 mL/hr over 60 Minutes Intravenous Every 24 hours 10/14/23 1740 10/15/23 1045   10/15/23 1430  piperacillin-tazobactam (ZOSYN) IVPB 3.375 g        3.375 g 100 mL/hr over 30 Minutes Intravenous  Once 10/15/23 1343 10/15/23 1503  10/15/23 1400  DAPTOmycin (CUBICIN) IVPB 500 mg/70mL premix        8 mg/kg  64.1 kg 100 mL/hr over 30 Minutes Intravenous Daily 10/15/23 1045     10/14/23 1800  vancomycin (VANCOREADY) IVPB 1250 mg/250 mL        1,250 mg 166.7 mL/hr over 90 Minutes Intravenous  Once 10/14/23 1740 10/14/23 2255   10/14/23 1400  piperacillin-tazobactam (ZOSYN) IVPB 3.375 g        3.375 g 12.5 mL/hr over 240 Minutes Intravenous Every 8 hours 10/14/23 0845     10/14/23 0900  piperacillin-tazobactam (ZOSYN) IVPB 3.375 g        3.375 g 100 mL/hr over 30 Minutes Intravenous NOW 10/14/23 0844 10/14/23 1002    10/05/23 1200  piperacillin-tazobactam (ZOSYN) IVPB 3.375 g        3.375 g 12.5 mL/hr over 240 Minutes Intravenous Every 8 hours 10/05/23 0721 10/09/23 1716   10/04/23 2000  piperacillin-tazobactam (ZOSYN) IVPB 3.375 g  Status:  Discontinued        3.375 g 12.5 mL/hr over 240 Minutes Intravenous Every 8 hours 10/04/23 1827 10/05/23 0721   09/27/23 2315  piperacillin-tazobactam (ZOSYN) IVPB 3.375 g  Status:  Discontinued        3.375 g 12.5 mL/hr over 240 Minutes Intravenous Every 8 hours 09/27/23 2305 10/02/23 1106        Assessment/Plan  POD21 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 [name] - CT 10/24 with hematoma in the pelvis adjacent to small bowel anastomosis and ileus vs obstruction  - IR 11/1: drain placement for small bowel anastomotic leak/ abscess drainage - CT 11/8 with resolution in previous right hemipelvic collection and no leakage of enteric contrast - drain injection study today confirms fistulous connection - drain left in place - tolerating FLD - ok to advance to soft diet and if tolerating may start to wean TPN, may start calorie count tomorrow to see what patient is able to take in - getting in enough nutritionally will be key to trying to heal fistula  - stomal suppositories and encourage mobilization as able  - continue daily dressing changes     FEN: PICC/TPN; soft diet  VTE: IVC Filter  ID: no current abx   - per TRH -  ABL anemia secondary to post-op hematomas and hematuria  Acute PE and LLE DVT Pancreatic tail cyst Thyroid nodule  Chronic HFpEF Paroxysmal A. Fib Pre-diabetes RLS   LOS: 25 days    Juliet Rude, [facility] 10/22/2023, 1:19 PM Please see Amion for pager number during day hours 7:00am-4:30pm

## 2023-10-22 NOTE — Consult Note (Signed)
WOC Nurse ostomy follow up Stoma type/location: LLQ, colostomy  Stomal assessment/size: 1 1/4" round, budded, os at center, pale Peristomal assessment: intact  Treatment options for stomal/peristomal skin: 2" ostomy barrier ring  Output formed brown stool Ostomy pouching: 2pc. 2 1/4" with 2" barrier ring; I may convert to 1pc with barrier ring with next pouch change bc patient will not be performing self care Education provided:  Patient is not interested at all in learning care, we we discussed opening and closing at least minimally so she could go home, she stated "Im not ready for that".  I do not feel after this amount of time and encouragement patient will be performing her ostomy care and will be completely dependent on care.  Enrolled patient in [place] Secure Start Discharge program: Yes  Will need SNF at DC for rehab and ostomy care/further support towards independence.   WOC Nurse will follow along with you for continued support with ostomy teaching and care [facility], RN, [place], CNS, [place] [phone]

## 2023-10-22 NOTE — Progress Notes (Signed)
[facility]                                                                              Elizabeth Odom, is a 82 y.o. female, DOB - December 13, 1940, FUX:[id number] Admit date - 09/27/2023    Outpatient Primary MD for the patient is Elizabeth Pilon, FNP  LOS - 25  days  Chief Complaint  Patient presents with   Abdominal Pain       Brief summary   Patient is a 82 year old F with PMH of A-fib, RLS and anemia presented to ED on 10/17 with recurrent abdominal pain, nausea, emesis, diarrhea and intolerance of oral intake.  Patient was seen in ED on 10/10 for abdominal pain, decreased appetite and fever and discharged on Augmentin for possible colitis noted on CT abdomen and pelvis.  However, she had recurrence of symptoms after interval improvement and presented back to the hospital.   CT initially showed proximal sigmoid colon wall thickening with upstream colonic dilation with air-fluid levels and large amount of stool compatible with obstruction.  There is also a 9 mm cystic lesion in the tail of pancreas, and subcentimeter lucent lesion in L1 vertebral body.  GI consulted, patient refused colonoscopy but agreed to sigmoidoscopy showing stricture versus mass at the rectosigmoid region -scope could not be advanced past this position.  Surgery was consulted -status post ex lap with small bowel resection and Hartman's resection and colostomy on 10/01/2023.  Patient continues to have postop ileus, managed by general surgery.  NG remains in place to suction.   Hospital course complicated by acute PE and LLE DVT for which she was started on heparin. She developed profound acute blood loss anemia with subsequent pelvic hematoma near the small bowel anastomosis as well as notable hematuria.  Urology was consulted, signed off after resolution of hematuria. Given high risk of bleed with notable PE/DVT, evaluated by IR and placed IVC filter on 10/05/2023.  Patient required total of 4 unit of  blood transfusion.   Patient continues to work with PT OT, patient will likely require placement given her prolonged hospitalization, weakness, and diminished ambulatory status from baseline.   As her bowel function did not return, she underwent a repeat CT abd and pelvis on 10/31 showing  anastomotic leak into 7 x 6 cm complex fluid collection posteriorly in the pelvis. IR was consulted and underwent CT guided percutaneous drain placement into pelvic abscess.  Fluid culture grew MRSA. ID consulted and she was transitioned to daptomycin and zosyn.    Assessment & Plan    Principal Problem:   Large bowel obstruction (HCC)  -CT abdomen 10/17 with proximal sigmoid colon thickening.  -GI consulted and underwent  sigmoidoscopy 10/19 with tight stricture - was found to have a mass biopsy negative for malignancy as suggesting inflammation.  -Surgery consulted, /p ex lap, hartman's resection on 10/21 with small bowel resection and ostomy ([name]).  - Repeat CT abd pelvis on 10/31 showed a small leak at her anastomosis going into a 6 x 7 cm fluid collection  - IR consulted and underwent drainage catheter placement for pelvic abscess on 11/1.  -  Fluid culture grew MRSA, ID was consulted and transitioned to Daptomycin and zosyn.  -Repeat CT abdomen 11/7 showed percutaneous pigtail drain catheter, no evidence of persistent anastomotic leak or fistula. -Currently on TPN, full liquid diet.  IR,, surgery managing -Per IR, pelvic drain injection study today revealed a fistulous communication with colon/staple line anastomosis, drain to remain in place - Per ID, no further indication for continued antibiotics now and will stop, stop antibiotics   Post  operative ileus vs partial SBO - Exacerbated by intra abd bleeding.  -- Continue TPN, drain to remain in place  Hyponatremia -Improving, D10 drip discontinued -Sodium 131, on PPN    Acute blood loss anemia sec to pelvic/ peri anastomotic hematomas  with Hematuria -S/p 4 units of prbc transfusions.  -S/p IVC filter  -Urology recommended no indication for cystoscopy or intervention.  -Recheck CBC    Acute PE with right heart strain with acute left leg DVT Unable to tolerate anticoagulation status post IVC filter placement on 10/25     Nodule in the inf aspect of the isthmus Thyroid US - follow up outpatient.      Paroxysmal atrial fibrillation Rate controlled. -Not on anticoagulation    Hypokalemia and hypophosphatemia and hypomagnesemia Replace as needed   Urinary retention:  -UA negative for infection  Hypotension: resolved.     Leukocytosis  Resolved.    Pressure Injury Documentation: Pressure Injury 10/08/23 Buttocks Right;Mid Stage 2 -  Partial thickness loss of dermis presenting as a shallow open injury with a red, pink wound bed without slough. (Active)  10/08/23 0800  Location: Buttocks  Location Orientation: Right;Mid  Staging: Stage 2 -  Partial thickness loss of dermis presenting as a shallow open injury with a red, pink wound bed without slough.  Wound Description (Comments):   Present on Admission:   Dressing Type Foam - Lift dressing to assess site every shift 10/22/23 0800    Moderate protein calorie malnutrition Nutrition Problem: Moderate Malnutrition Etiology: chronic illness Signs/Symptoms: moderate fat depletion, severe muscle depletion Interventions: Refer to RD note for recommendations, TPN  Estimated body mass index is 22.38 kg/m as calculated from the following:   Height as of this encounter: 5\' 5"  (1.651 m).   Weight as of this encounter: 61 kg.  Code Status: DNR DVT Prophylaxis:  Place and maintain sequential compression device Start: 10/06/23 1319   Level of Care: Level of care: Telemetry Family Communication: Updated patient Disposition Plan:      Remains inpatient appropriate: Will need placement at discharge.   Procedures:  10/21 s/p ex lap hartman's resection , small bowel  resection and ostomy.  IVC filter placement  Consultants:   IR General Surgery Infectious disease  Antimicrobials:   Anti-infectives (From admission, onward)    Start     Dose/Rate Route Frequency Ordered Stop   10/15/23 1800  vancomycin (VANCOCIN) IVPB 1000 mg/200 mL premix  Status:  Discontinued        1,000 mg 200 mL/hr over 60 Minutes Intravenous Every 24 hours 10/14/23 1740 10/15/23 1045   10/15/23 1430  piperacillin-tazobactam (ZOSYN) IVPB 3.375 g        3.375 g 100 mL/hr over 30 Minutes Intravenous  Once 10/15/23 1343 10/15/23 1503   10/15/23 1400  DAPTOmycin (CUBICIN) IVPB 500 mg/68mL premix  Status:  Discontinued        8 mg/kg  64.1 kg 100 mL/hr over 30 Minutes Intravenous Daily 10/15/23 1045 10/22/23 1321   10/14/23 1800  vancomycin (VANCOREADY) IVPB  1250 mg/250 mL        1,250 mg 166.7 mL/hr over 90 Minutes Intravenous  Once 10/14/23 1740 10/14/23 2255   10/14/23 1400  piperacillin-tazobactam (ZOSYN) IVPB 3.375 g  Status:  Discontinued        3.375 g 12.5 mL/hr over 240 Minutes Intravenous Every 8 hours 10/14/23 0845 10/22/23 1321   10/14/23 0900  piperacillin-tazobactam (ZOSYN) IVPB 3.375 g        3.375 g 100 mL/hr over 30 Minutes Intravenous NOW 10/14/23 0844 10/14/23 1002   10/05/23 1200  piperacillin-tazobactam (ZOSYN) IVPB 3.375 g        3.375 g 12.5 mL/hr over 240 Minutes Intravenous Every 8 hours 10/05/23 0721 10/09/23 1716   10/04/23 2000  piperacillin-tazobactam (ZOSYN) IVPB 3.375 g  Status:  Discontinued        3.375 g 12.5 mL/hr over 240 Minutes Intravenous Every 8 hours 10/04/23 1827 10/05/23 0721   09/27/23 2315  piperacillin-tazobactam (ZOSYN) IVPB 3.375 g  Status:  Discontinued        3.375 g 12.5 mL/hr over 240 Minutes Intravenous Every 8 hours 09/27/23 2305 10/02/23 1106          Medications  bisacodyl  10 mg Rectal Daily   Chlorhexidine Gluconate Cloth  6 each Topical Daily   docusate sodium  100 mg Oral BID   insulin aspart  0-9 Units  Subcutaneous Q8H   ketotifen  2 drop Both Eyes BID   lactose free nutrition  237 mL Oral BID BM   sodium chloride flush  10-40 mL Intracatheter Q12H   sodium chloride flush  5 mL Intracatheter Q8H      Subjective:   Shyan Benway was seen and examined today.  No complaints this morning, in much better spirits on the regular floor.  Still very deconditioned.  Tolerating liquids, no nausea or vomiting.  Objective:   Vitals:   10/22/23 0035 10/22/23 0525 10/22/23 1001 10/22/23 1311  BP: (!) 108/58 112/60 109/79 (!) 94/57  Pulse: 67 66 66 73  Resp: 16 16 18 16   Temp: 98.9 F (37.2 C) 98.9 F (37.2 C) 98.2 F (36.8 C) 98.2 F (36.8 C)  TempSrc:  Oral Oral   SpO2: 95% 95% 96% 95%  Weight:      Height:        Intake/Output Summary (Last 24 hours) at 10/22/2023 1445 Last data filed at 10/22/2023 1319 Gross per 24 hour  Intake 1260.17 ml  Output 2510 ml  Net -1249.83 ml     Wt Readings from Last 3 Encounters:  10/21/23 61 kg  09/20/23 56.2 kg  11/23/20 55.3 kg   Physical Exam General: Alert and oriented x 3, NAD Cardiovascular: S1 S2 clear, RRR.  Respiratory: CTAB Gastrointestinal: JP drain, ND, stoma with new ostomy Ext: no pedal edema bilaterally Neuro: no new deficits Psych: Normal affect    Data Reviewed:  I have personally reviewed following labs    CBC Lab Results  Component Value Date   WBC 8.6 10/20/2023   RBC 3.33 (L) 10/20/2023   HGB 10.4 (L) 10/20/2023   HCT 31.9 (L) 10/20/2023   MCV 95.8 10/20/2023   MCH 31.2 10/20/2023   PLT 312 10/20/2023   MCHC 32.6 10/20/2023   RDW 15.6 (H) 10/20/2023   LYMPHSABS 1.0 10/17/2023   MONOABS 0.9 10/17/2023   EOSABS 0.4 10/17/2023   BASOSABS 0.1 10/17/2023     Last metabolic panel Lab Results  Component Value Date   NA 128 (L) 10/22/2023  K 3.7 10/22/2023   CL 100 10/22/2023   CO2 22 10/22/2023   BUN 18 10/22/2023   CREATININE 0.65 10/22/2023   GLUCOSE 121 (H) 10/22/2023   GFRNONAA >60  10/22/2023   GFRAA >60 04/10/2016   CALCIUM 8.1 (L) 10/22/2023   PHOS 2.9 10/22/2023   PROT 6.2 (L) 10/22/2023   ALBUMIN 2.1 (L) 10/22/2023   BILITOT 0.6 10/22/2023   ALKPHOS 205 (H) 10/22/2023   AST 48 (H) 10/22/2023   ALT 59 (H) 10/22/2023   ANIONGAP 6 10/22/2023    CBG (last 3)  Recent Labs    10/22/23 0034 10/22/23 0806 10/22/23 0829  GLUCAP 116* 124* 106*      Coagulation Profile: No results for input(s): "INR", "PROTIME" in the last 168 hours.    Radiology Studies: I have personally reviewed the imaging studies  DG Pelvis Portable  Result Date: 10/22/2023 CLINICAL DATA:  [id number] Pelvic abscess in female [id number] EXAM: PORTABLE PELVIS 1-2 VIEWS COMPARISON:  IR drain injection, concurrent. CT AP, 10/19/2023 and 10/11/2023. IR CT, 10/12/2023. FINDINGS: RIGHT pelvic drainage catheter with pigtail tip projecting near midline and lateral to the anastomotic staple line. Contrast opacification along the colon and line. Prior intraluminal contrast opacification of ascending and transverse colon. No interval osseous abnormality. IMPRESSION: RIGHT pelvic drainage catheter with contrast opacification of the colon and staple line. Findings highly suspicious for fistulous communication with anastomosis. Electronically Signed   By: Roanna Banning M.D.   On: 10/22/2023 11:21       Sharlot Sturkey M.D. [facility] 10/22/2023, 2:45 PM  Available via Epic secure chat 7am-7pm After 7 pm, please refer to night coverage provider listed on amion.   s

## 2023-10-22 NOTE — Progress Notes (Signed)
Patient ID: Elizabeth Odom, female   DOB: 1941-07-04, 82 y.o.   MRN: [id number] Pelvic drain injection study today revealed likely fistulous communication with colon/staple line(anastomosis). [facility] notified. Continue drain.

## 2023-10-22 NOTE — TOC Progression Note (Signed)
Transition of Care [facility]) - Progression Note    Patient Details  Name: Elizabeth Odom MRN: [id number] Date of Birth: February 06, 1941  [facility]) CM/SW Contact  Amada Jupiter, LCSW Phone Number: 10/22/2023, 3:12 PM  Clinical Narrative:     Met with pt today to discuss concerns from team about dc needs.  Per WOC RN, pt very reluctant to learn/ attempt to learn colostomy care and really not safe to dc home.  Pt states she is limited by "my hands" in doing colostomy care.  Explained that the team has recommended SNF for rehab prior to a home dc.  Pt is now reluctantly agreeable to this plan.  Will begin SNF work up but barrier at this point will be the TPN.  Expected Discharge Plan: Home w Home Health Services Barriers to Discharge: Continued Medical Work up  Expected Discharge Plan and Services In-house Referral: NA Discharge Planning Services: CM Consult Post Acute Care Choice: Home Health Living arrangements for the past 2 months: Apartment                 DME Arranged: N/A DME Agency: NA       HH Arranged: PT, RN HH Agency: [facility] Date HH Agency Contacted: 10/02/23 Time HH Agency Contacted: 1550 Representative spoke with at [facility] Agency: Morrie Sheldon   Social Determinants of Health (SDOH) Interventions SDOH Screenings   Food Insecurity: No Food Insecurity (09/28/2023)  Housing: Low Risk  (09/28/2023)  Transportation Needs: No Transportation Needs (09/28/2023)  Utilities: Not At Risk (09/28/2023)  Tobacco Use: Low Risk  (10/04/2023)    Readmission Risk Interventions    10/19/2023    6:13 PM  Readmission Risk Prevention Plan  Transportation Screening Complete  PCP or Specialist Appt within 5-7 Days Complete  Home Care Screening Complete  Medication Review (RN CM) Complete

## 2023-10-22 NOTE — Progress Notes (Signed)
Nutrition Follow-up  DOCUMENTATION CODES:   Non-severe (moderate) malnutrition in context of chronic illness  INTERVENTION:  - Plan to continue TPN at this time.              - TPN management per pharmacy.    - Daily weights while on TPN.    - Full Liquid diet per Surgery.  - Add Boost Plus po BID, each supplement provides 360 kcal and 14 grams of protein  NUTRITION DIAGNOSIS:   Moderate Malnutrition related to chronic illness as evidenced by moderate fat depletion, severe muscle depletion. *ongoing  GOAL:   Patient will meet greater than or equal to 90% of their needs *progressing, on TPN and full liquid diet  MONITOR:   PO intake, Supplement acceptance, Diet advancement, Labs, Weight trends, I & O's  REASON FOR ASSESSMENT:   Consult New TPN/TNA  ASSESSMENT:   82 year old F with PMH of A-fib, RLS and anemia presented to ED on 10/17 with recurrent abdominal pain, nausea, emesis, diarrhea and intolerance of oral intake.  Patient was seen in ED on 10/10 for abdominal pain, decreased appetite and fever and discharged on Augmentin for possible colitis noted on CT abdomen and pelvis.  10/18: admitted 10/19: s/p flex sigmoidoscopy 10/21: s/p colectomy w/ colostomy 10/23: NPO 10/31: CLD -> NPO 11/1: TPN started 11/5: CLD 11/8: FLD  Patient out of room at time of visit, no visitors at bedside.   Patient advanced to a full liquid diet on 11/8. Intake is not well documented since diet advancement 11/5. Had 0% of breakfast today. Now that diet advanced will add Boost Plus (likes) to support intake.   Remains on TPN at this time.  Per Pharmacy: Clinimix 8/10 1L x5 days per week and 2L x2 days per week with daily lipids ( ).  Average daily total:  103 g of protein (100% of goal), 1353 kcals (71% of goal)  *Patient had previously also been receiving D10 infusion for additional kcals however this was discontinued 11/9 due to hyponatremia.    Admit weight: 124# Current  weight: 134# I&O's: +489mL  Medications reviewed and include: Dulcolax  Labs reviewed:  Na 128 HA1C 5.8 Blood Glucose 106-134 x24 hours Triglycerides 273 (as of 11/11)   Diet Order:   Diet Order             Diet full liquid Room service appropriate? Yes; Fluid consistency: Thin  Diet effective now                   EDUCATION NEEDS:  Education needs have been addressed  Skin:  Skin Assessment: Skin Integrity Issues: Skin Integrity Issues:: Stage II, Incisions Stage II: mid sacrum Incisions: 10/21 abdomen  Last BM:  11/9 - colostomy  Height:  Ht Readings from Last 1 Encounters:  10/04/23 5\' 5"  (1.651 m)   Weight:  Wt Readings from Last 1 Encounters:  10/21/23 61 kg   Ideal Body Weight:     BMI:  Body mass index is 22.38 kg/m.  Estimated Nutritional Needs:  Kcal:  1900-2100 Protein:  90-105g Fluid:  2L/day    Shelle Iron RD, LDN For contact information, refer to [facility].

## 2023-10-22 NOTE — Plan of Care (Signed)

## 2023-10-22 NOTE — Progress Notes (Signed)
[facility]   Reason for visit: Follow up on intra abdominal abscess  Interval History: Fistula study revealed fistulous communication with colon.   Tmax 100.4.  CT scan 11/8 with resolution of fluid collection.   Physical Exam: Constitutional:  Vitals:   10/22/23 1001 10/22/23 1311  BP: 109/79 (!) 94/57  Pulse: 66 73  Resp: 18 16  Temp: 98.2 F (36.8 C) 98.2 F (36.8 C)  SpO2: 96% 95%   patient appears in NAD Respiratory: Normal respiratory effort  Review of Systems: Constitutional: negative for fevers and chills  Lab Results  Component Value Date   WBC 8.6 10/20/2023   HGB 10.4 (L) 10/20/2023   HCT 31.9 (L) 10/20/2023   MCV 95.8 10/20/2023   PLT 312 10/20/2023    Lab Results  Component Value Date   CREATININE 0.65 10/22/2023   BUN 18 10/22/2023   NA 128 (L) 10/22/2023   K 3.7 10/22/2023   CL 100 10/22/2023   CO2 22 10/22/2023    Lab Results  Component Value Date   ALT 59 (H) 10/22/2023   AST 48 (H) 10/22/2023   ALKPHOS 205 (H) 10/22/2023     Microbiology: Recent Results (from the past 240 hour(s))  Aerobic/Anaerobic Culture w Gram Stain (surgical/deep wound)     Status: None   Collection Time: 10/12/23  3:44 PM   Specimen: Abscess  Result Value Ref Range Status   Specimen Description   Final    ABSCESS Performed at [facility], [address], [place], [place] [address]    Special Requests   Final    NONE Performed at [facility], [address] [place], [place] [address]    Gram Stain   Final    MODERATE WBC PRESENT, PREDOMINANTLY PMN MODERATE GRAM POSITIVE COCCI FEW GRAM NEGATIVE RODS    Culture   Final    ABUNDANT METHICILLIN RESISTANT STAPHYLOCOCCUS AUREUS NO ANAEROBES ISOLATED Sent to [facility] for further susceptibility testing. SEE SEPARATE REPORT Performed at [facility], [address] [place], [place] [address]    Report Status 10/20/2023 FINAL  Final    Organism ID, Bacteria METHICILLIN RESISTANT STAPHYLOCOCCUS AUREUS  Final      Susceptibility   Methicillin resistant staphylococcus aureus - MIC*    CIPROFLOXACIN >=8 RESISTANT Resistant     ERYTHROMYCIN >=8 RESISTANT Resistant     GENTAMICIN <=0.5 SENSITIVE Sensitive     OXACILLIN >=4 RESISTANT Resistant     TETRACYCLINE <=1 SENSITIVE Sensitive     VANCOMYCIN 1 SENSITIVE Sensitive     TRIMETH/SULFA >=320 RESISTANT Resistant     CLINDAMYCIN <=0.25 SENSITIVE Sensitive     RIFAMPIN <=0.5 SENSITIVE Sensitive     Inducible Clindamycin NEGATIVE Sensitive     LINEZOLID 2 SENSITIVE Sensitive     * ABUNDANT METHICILLIN RESISTANT STAPHYLOCOCCUS AUREUS  Minimum Inhibitory Conc. (1 Drug)     Status: Abnormal   Collection Time: 10/12/23 11:26 PM  Result Value Ref Range Status   Min Inhibitory Conc (1 Drug) Final report (A)  Corrected    Comment: (NOTE) Performed At: [facility] [address] [place], [place] [id number] Jolene Schimke MD VO:[id number] CORRECTED ON 11/09 AT 4259: PREVIOUSLY REPORTED AS Preliminary report    Source CRE PENDING  Incomplete    Impression/Plan:  1. Abscess - at this point abscess appears resolved.  No further indication for continued antibiotics now and will stop and observe off of antibiotics.  2.  Fistula - noted on IR pelvic drain injection study.  Drain to remain in place for now.  Followed by surgery.    I will otherwise sign off, call with questions.

## 2023-10-22 NOTE — Progress Notes (Signed)
PHARMACY - TOTAL PARENTERAL NUTRITION CONSULT NOTE   Indication:  Prolonged ileus  Patient Measurements: Height: 5\' 5"  (165.1 cm) Weight: 61 kg (134 lb 7.7 oz) IBW/kg (Calculated) : 57 TPN AdjBW (KG): 62 Body mass index is 22.38 kg/m. Usual Weight: 64.5 on 10/04/2023  Assessment:  TPN per Pharmacy consult for this 82 yo female admitted on 09/27/2023 with recurrent abdominal pain, nausea, emesis, diarrhea and intolerance of oral intake. Surgery completed exp lap with small bowel resection and Hartman's resection and colostomy on 10/01/2023.  Post-op ileus, 10/31 imaging showed anastomotic leak - drain placed by IR on 11/1 for abscess.  Glucose / Insulin: No hx DM. Goal CBG <180 -BG range: 106-126. 2 units SSI/24 hrs Electrolytes: Na (128) remains low. All other lytes WNL, including CorrCa (9.6) -Bicarb, chloride both WNL Renal: SCr <1, BUN WNL Hepatic: LFTs, Alk Phos remain elevated. TG (273) now elevated -Albumin low Intake / Output; MIVF: no mIVF -UOP: 3725 mL. Drain output: 10 mL. LBM 11/9 GI Imaging: 10/10 CT A/P: small bowel enteritis/colitis that was also involving the sigmoid colon. No bowel obstruction seen.  10/17 CT A/P: wall thickening sigmoid colon worrisome for neoplasm. Focal colitis not excluded. Colonic dilation compatible with obstruction.  Cystic mass at the tail of the pancreas.  10/18 MR - 9 mm cystic lesion within pancreatic body/tail junction, colonic obstruction 10/31 CT A/P - colostomy and Hartmann's pouch seen.  Appears to be extravasation of contrast suggesting perforation or leakage. 11/8 CT A/P: No persistent anastomotic leak or fistula. GI Surgeries / Procedures:  10/19 flexible sigmoidoscopy 10/21 colectomy with colostomy creation/Hartman procedure 11/1: CT guided visceral fluid drain by perc cath  Central access: PICC line placed 11/1 TPN start date: 11/1  Nutritional Goals: Due to the Baxter IV fluid disruption, we will be switching all adult  compounded parenteral nutrition to premade Clinimix products +/- fat emulsion infusion. Our goal will be to continue providing as close to 100% of our patient's nutritional needs. Due to the volatile availability of different Clinimix concentrations we may not be able to meet 100% of adult patient nutritional goals at this time.    TPN:  Clinimix 8/10, 1L x 5 days per week and 2L x 2 days per week with daily lipids ( ) to provide 103 g protein and an average of 1353 kcal per day D10 previously infusing in addition to Clinimix to provide additional kcal, however, discontinued 11/9 given hyponatremia  RD Assessment:  Estimated Needs Total Energy Estimated Needs: 1900-2100 Total Protein Estimated Needs: 90-105g Total Fluid Estimated Needs: 2L/day  Current Nutrition:  Full Liquid TPN  Plan:  Now: KCl 10 mEq IV x 2 doses  At 1800: Continue TPN with Clinimix E 8/10 + SMOF lipids Clinimix 2L on Monday, Thursday. 1L all other days of the week Electrolytes in TPN: Unable to adjust Na 35 mEq/L, K 30 mEq/L, Ca 4.5 mEq/L, Mg 5 mEq/L, and Phos 15 mmol/L. Cl:Ac 1:1 Add standard MVI and trace elements to TPN, no chromium due to shortage Continue Sensitive SSI q8h and adjust as needed Monitor TPN labs on Mon/Thurs and as needed Follow ability to tolerate advancing diet for ability to wean off of TPN.  Cindi Carbon, PharmD 10/22/23 10:45 AM

## 2023-10-23 DIAGNOSIS — D500 Iron deficiency anemia secondary to blood loss (chronic): Secondary | ICD-10-CM | POA: Diagnosis not present

## 2023-10-23 DIAGNOSIS — K529 Noninfective gastroenteritis and colitis, unspecified: Secondary | ICD-10-CM | POA: Diagnosis not present

## 2023-10-23 DIAGNOSIS — R1084 Generalized abdominal pain: Secondary | ICD-10-CM | POA: Diagnosis not present

## 2023-10-23 DIAGNOSIS — K56609 Unspecified intestinal obstruction, unspecified as to partial versus complete obstruction: Secondary | ICD-10-CM | POA: Diagnosis not present

## 2023-10-23 LAB — GLUCOSE, CAPILLARY
Glucose-Capillary: 132 mg/dL — ABNORMAL HIGH (ref 70–99)
Glucose-Capillary: 122 mg/dL — ABNORMAL HIGH (ref 70–99)

## 2023-10-23 LAB — PREALBUMIN: Prealbumin: 19 mg/dL (ref 18–38)

## 2023-10-23 LAB — CBC
HCT: 31.5 % — ABNORMAL LOW (ref 36.0–46.0)
Hemoglobin: 10.1 g/dL — ABNORMAL LOW (ref 12.0–15.0)
MCH: 30.9 pg (ref 26.0–34.0)
MCHC: 32.1 g/dL (ref 30.0–36.0)
MCV: 96.3 fL (ref 80.0–100.0)
Platelets: 270 10*3/uL (ref 150–400)
RBC: 3.27 MIL/uL — ABNORMAL LOW (ref 3.87–5.11)
RDW: 15.8 % — ABNORMAL HIGH (ref 11.5–15.5)
WBC: 7.7 10*3/uL (ref 4.0–10.5)
nRBC: 0 % (ref 0.0–0.2)

## 2023-10-23 LAB — POTASSIUM: Potassium: 4.1 mmol/L (ref 3.5–5.1)

## 2023-10-23 LAB — MAGNESIUM: Magnesium: 1.9 mg/dL (ref 1.7–2.4)

## 2023-10-23 MED ORDER — FAT EMUL FISH OIL/PLANT BASED 20% (SMOFLIPID)IV EMUL
250.0000 mL | INTRAVENOUS | Status: AC
  Administered 2023-10-23: 250 mL via INTRAVENOUS
  Filled 2023-10-23: qty 250

## 2023-10-23 MED ORDER — POLYETHYLENE GLYCOL 3350 17 G PO PACK
17.0000 g | PACK | Freq: Every day | ORAL | Status: DC
  Administered 2023-10-23 – 2023-11-02 (×9): 17 g via ORAL
  Filled 2023-10-23 (×12): qty 1

## 2023-10-23 MED ORDER — OXYCODONE HCL 5 MG PO TABS
2.5000 mg | ORAL_TABLET | ORAL | Status: DC | PRN
Start: 2023-10-23 — End: 2023-10-26

## 2023-10-23 MED ORDER — TRACE MINERALS CU-MN-SE-ZN 300-55-60-3000 MCG/ML IV SOLN
INTRAVENOUS | Status: AC
  Filled 2023-10-23: qty 984

## 2023-10-23 MED ORDER — DAKINS (1/4 STRENGTH) 0.125 % EX SOLN
Freq: Two times a day (BID) | CUTANEOUS | Status: AC
  Administered 2023-10-23: 1
  Filled 2023-10-23: qty 473

## 2023-10-23 NOTE — Progress Notes (Addendum)
[facility]                                                                              Elizabeth Odom, is a 82 y.o. female, DOB - 07-Oct-1941, ZOX:[id number] Admit date - 09/27/2023    Outpatient Primary MD for the patient is [name], FNP  LOS - 26  days  Chief Complaint  Patient presents with   Abdominal Pain       Brief summary   Patient is a 82 year old F with PMH of A-fib, RLS and anemia presented to ED on 10/17 with recurrent abdominal pain, nausea, emesis, diarrhea and intolerance of oral intake.  Patient was seen in ED on 10/10 for abdominal pain, decreased appetite and fever and discharged on Augmentin for possible colitis noted on CT abdomen and pelvis.  However, she had recurrence of symptoms after interval improvement and presented back to the hospital.   CT initially showed proximal sigmoid colon wall thickening with upstream colonic dilation with air-fluid levels and large amount of stool compatible with obstruction.  There is also a 9 mm cystic lesion in the tail of pancreas, and subcentimeter lucent lesion in L1 vertebral body.  GI consulted, patient refused colonoscopy but agreed to sigmoidoscopy showing stricture versus mass at the rectosigmoid region -scope could not be advanced past this position.  Surgery was consulted -status post ex lap with small bowel resection and Hartman's resection and colostomy on 10/01/2023.  Patient continues to have postop ileus, managed by general surgery.  NG remains in place to suction.   Hospital course complicated by acute PE and LLE DVT for which she was started on heparin. She developed profound acute blood loss anemia with subsequent pelvic hematoma near the small bowel anastomosis as well as notable hematuria.  Urology was consulted, signed off after resolution of hematuria. Given high risk of bleed with notable PE/DVT, evaluated by IR and placed IVC filter on 10/05/2023.  Patient required total of 4 unit of  blood transfusion.   Patient continues to work with PT OT, patient will likely require placement given her prolonged hospitalization, weakness, and diminished ambulatory status from baseline.   As her bowel function did not return, she underwent a repeat CT abd and pelvis on 10/31 showing  anastomotic leak into 7 x 6 cm complex fluid collection posteriorly in the pelvis. IR was consulted and underwent CT guided percutaneous drain placement into pelvic abscess.  Fluid culture grew MRSA. ID consulted and she was transitioned to daptomycin and zosyn.   11/11: Pelvic drain injection study reviewed fistula with colon/staple line anastomosis, drain to remain in place Seen by ID, recommended no further need of antibiotics, diet advanced  Awaiting SNF  Assessment & Plan    Principal Problem:   Large bowel obstruction (HCC)  -CT abdomen 10/17 with proximal sigmoid colon thickening.  -GI consulted, s/p sigmoidoscopy 10/19 with tight stricture - was found to have a mass, biopsy negative for malignancy, as suggesting inflammation.  -s/p ex lap, hartman's resection on 10/21 with small bowel resection and ostomy ([name]).  - CT abd pelvis on 10/31 showed a small leak at her  anastomosis going into a 6x7 cm fluid collection  - IR consulted and underwent drainage catheter placement for pelvic abscess on 11/1.  - Fluid culture grew MRSA, ID was consulted and transitioned to Daptomycin and zosyn.  -Repeat CT abd 11/7 showed percutaneous pigtail drain catheter, no persistent anastomotic leak or fistula. -Per IR, pelvic drain injection study on 11/11 revealed a fistulous communication with colon/staple line anastomosis, drain to remain in place - Per ID, no further indication for continued antibiotics now.  Antibiotics dc'ed. -Continue TPN, soft diet, surgery following.   Post  operative ileus vs partial SBO - Exacerbated by intra abd bleeding.  -- Continue TPN, drain to remain in  place  Hyponatremia - D10 discontinued, stable, asymptomatic, sodium 128    Acute blood loss anemia sec to pelvic/ peri anastomotic hematomas with Hematuria -S/p 4 units of prbc transfusions. S/p IVC filter  -Urology recommended no indication for cystoscopy or intervention.  -H&H stable    Acute PE with right heart strain with acute left leg DVT Unable to tolerate anticoagulation status post IVC filter placement on 10/25     Nodule in the inf aspect of the isthmus Thyroid US - follow up outpatient.      Paroxysmal atrial fibrillation Rate controlled. -Not on anticoagulation    Hypokalemia, hypophosphatemia,  hypomagnesemia Replace as needed   Urinary retention:  -UA negative for infection  Hypotension: resolved.     Leukocytosis  Resolved.    Pressure Injury Documentation: Right mid stage II buttocks Noted on 10/08/2023 not present on admission (developed during hospital admission)  -Dressing changes per nursing  Moderate protein calorie malnutrition Nutrition Problem: Moderate Malnutrition Etiology: chronic illness Signs/Symptoms: moderate fat depletion, severe muscle depletion Interventions: Refer to RD note for recommendations, TPN  Estimated body mass index is 22.75 kg/m as calculated from the following:   Height as of this encounter: 5\' 5"  (1.651 m).   Weight as of this encounter: 62 kg.  Code Status: DNR DVT Prophylaxis:  Place and maintain sequential compression device Start: 10/06/23 1319   Level of Care: Level of care: Telemetry Family Communication: Updated patient Disposition Plan:      Remains inpatient appropriate: Will need placement at discharge.   Procedures:  10/21 s/p ex lap hartman's resection , small bowel resection and ostomy.  IVC filter placement  Consultants:   IR General Surgery Infectious disease  Antimicrobials:   Anti-infectives (From admission, onward)    Start     Dose/Rate Route Frequency Ordered Stop   10/15/23 1800   vancomycin (VANCOCIN) IVPB 1000 mg/200 mL premix  Status:  Discontinued        1,000 mg 200 mL/hr over 60 Minutes Intravenous Every 24 hours 10/14/23 1740 10/15/23 1045   10/15/23 1430  piperacillin-tazobactam (ZOSYN) IVPB 3.375 g        3.375 g 100 mL/hr over 30 Minutes Intravenous  Once 10/15/23 1343 10/15/23 1503   10/15/23 1400  DAPTOmycin (CUBICIN) IVPB 500 mg/35mL premix  Status:  Discontinued        8 mg/kg  64.1 kg 100 mL/hr over 30 Minutes Intravenous Daily 10/15/23 1045 10/22/23 1321   10/14/23 1800  vancomycin (VANCOREADY) IVPB 1250 mg/250 mL        1,250 mg 166.7 mL/hr over 90 Minutes Intravenous  Once 10/14/23 1740 10/14/23 2255   10/14/23 1400  piperacillin-tazobactam (ZOSYN) IVPB 3.375 g  Status:  Discontinued        3.375 g 12.5 mL/hr over 240 Minutes Intravenous Every 8  hours 10/14/23 0845 10/22/23 1321   10/14/23 0900  piperacillin-tazobactam (ZOSYN) IVPB 3.375 g        3.375 g 100 mL/hr over 30 Minutes Intravenous NOW 10/14/23 0844 10/14/23 1002   10/05/23 1200  piperacillin-tazobactam (ZOSYN) IVPB 3.375 g        3.375 g 12.5 mL/hr over 240 Minutes Intravenous Every 8 hours 10/05/23 0721 10/09/23 1716   10/04/23 2000  piperacillin-tazobactam (ZOSYN) IVPB 3.375 g  Status:  Discontinued        3.375 g 12.5 mL/hr over 240 Minutes Intravenous Every 8 hours 10/04/23 1827 10/05/23 0721   09/27/23 2315  piperacillin-tazobactam (ZOSYN) IVPB 3.375 g  Status:  Discontinued        3.375 g 12.5 mL/hr over 240 Minutes Intravenous Every 8 hours 09/27/23 2305 10/02/23 1106          Medications  bisacodyl  10 mg Rectal Daily   Chlorhexidine Gluconate Cloth  6 each Topical Daily   docusate  100 mg Oral BID   ketotifen  2 drop Both Eyes BID   lactose free nutrition  237 mL Oral BID BM   polyethylene glycol  17 g Oral Daily   sodium chloride flush  10-40 mL Intracatheter Q12H   sodium chloride flush  5 mL Intracatheter Q8H   sodium hypochlorite   Irrigation BID       Subjective:   Elizabeth Odom was seen and examined today.  No acute complaints except feeling freezing cold in the room, thermostat was down to 65 F.  No acute issues overnight.  Tolerating diet.   Objective:   Vitals:   10/22/23 1943 10/23/23 0500 10/23/23 0535 10/23/23 1219  BP: (!) 107/59  120/64 114/64  Pulse: 79   (!) 58  Resp: 20  20 16   Temp:   98.2 F (36.8 C) 97.8 F (36.6 C)  TempSrc:   Oral Oral  SpO2: 95%  97% 96%  Weight:  62 kg    Height:        Intake/Output Summary (Last 24 hours) at 10/23/2023 1317 Last data filed at 10/23/2023 1308 Gross per 24 hour  Intake 1912.33 ml  Output 2012.5 ml  Net -100.17 ml     Wt Readings from Last 3 Encounters:  10/23/23 62 kg  09/20/23 56.2 kg  11/23/20 55.3 kg   Physical Exam General: Alert and oriented x 3, NAD Cardiovascular: S1 S2 clear, RRR.  Respiratory: CTAB, no wheezing Gastrointestinal: Soft, midline wound with dressing, JP bulb with scant bilious fluid, ostomy.   Ext: no pedal edema bilaterally Neuro: no new deficits Psych: Normal affect    Data Reviewed:  I have personally reviewed following labs    CBC Lab Results  Component Value Date   WBC 7.7 10/23/2023   RBC 3.27 (L) 10/23/2023   HGB 10.1 (L) 10/23/2023   HCT 31.5 (L) 10/23/2023   MCV 96.3 10/23/2023   MCH 30.9 10/23/2023   PLT 270 10/23/2023   MCHC 32.1 10/23/2023   RDW 15.8 (H) 10/23/2023   LYMPHSABS 1.0 10/17/2023   MONOABS 0.9 10/17/2023   EOSABS 0.4 10/17/2023   BASOSABS 0.1 10/17/2023     Last metabolic panel Lab Results  Component Value Date   NA 128 (L) 10/22/2023   K 3.7 10/22/2023   CL 100 10/22/2023   CO2 22 10/22/2023   BUN 18 10/22/2023   CREATININE 0.65 10/22/2023   GLUCOSE 121 (H) 10/22/2023   GFRNONAA >60 10/22/2023   GFRAA >60 04/10/2016  CALCIUM 8.1 (L) 10/22/2023   PHOS 2.9 10/22/2023   PROT 6.2 (L) 10/22/2023   ALBUMIN 2.1 (L) 10/22/2023   BILITOT 0.6 10/22/2023   ALKPHOS 205 (H) 10/22/2023    AST 48 (H) 10/22/2023   ALT 59 (H) 10/22/2023   ANIONGAP 6 10/22/2023    CBG (last 3)  Recent Labs    10/22/23 1707 10/22/23 2352 10/23/23 0729  GLUCAP 112* 130* 132*      Coagulation Profile: No results for input(s): "INR", "PROTIME" in the last 168 hours.    Radiology Studies: I have personally reviewed the imaging studies  IR Sinus/Fist Tube Chk-Non GI  Result Date: 10/22/2023 CLINICAL DATA:  Pelvic abscess s/p drainage catheter placement 10/12/2023. EXAM: DRAIN INJECTION/FISTULOGRAM/ABSCESSOGRAM COMPARISON:  Pelvic XR, earlier same day. IR CT, 10/12/2023. CT AP, 10/19/2023 and 10/11/2023. CONTRAST:  10 mL Omnipaque 300-administered via the existing percutaneous drain. FLUOROSCOPY TIME:  Fluoroscopic dose; 20 mGy TECHNIQUE: The patient was positioned LEFT lateral decubitus on the fluoroscopy table. A preprocedural spot fluoroscopic image was obtained of the pelvis and the existing percutaneous drainage catheter. Multiple spot fluoroscopic and radiographic images were obtained following the injection of a small amount of contrast via the existing percutaneous drainage catheter. FINDINGS: *Similar positioning of RIGHT transgluteal-approach pelvic drainage catheter. *Drainage catheter contrast injection without significant residual pelvic collection. *Contrast opacification along the anastomotic staple line and adjacent colon. IMPRESSION: RIGHT pelvic drainage catheter with contrast opacification of the anastomotic staple line and adjacent colon. Findings highly suspicious for fistulous communication with the colonic anastomosis Electronically Signed   By: Roanna Banning M.D.   On: 10/22/2023 15:55   DG Pelvis Portable  Result Date: 10/22/2023 CLINICAL DATA:  [id number] Pelvic abscess in female [id number] EXAM: PORTABLE PELVIS 1-2 VIEWS COMPARISON:  IR drain injection, concurrent. CT AP, 10/19/2023 and 10/11/2023. IR CT, 10/12/2023. FINDINGS: RIGHT pelvic drainage catheter with pigtail tip  projecting near midline and lateral to the anastomotic staple line. Contrast opacification along the colon and line. Prior intraluminal contrast opacification of ascending and transverse colon. No interval osseous abnormality. IMPRESSION: RIGHT pelvic drainage catheter with contrast opacification of the colon and staple line. Findings highly suspicious for fistulous communication with anastomosis. Electronically Signed   By: Roanna Banning M.D.   On: 10/22/2023 11:21       Elizabeth Odom M.D. [facility] 10/23/2023, 1:17 PM  Available via Epic secure chat 7am-7pm After 7 pm, please refer to night coverage provider listed on amion.   s

## 2023-10-23 NOTE — Plan of Care (Signed)

## 2023-10-23 NOTE — Progress Notes (Signed)
Calorie Count Note  A 48 hour calorie count has been ordered.  Patient being followed by RD, last assessment yesterday.  Patient this AM reports eating some chicken, rice, and green beans yesterday. However, her main concern is she is very frustrated and overwhelmed by prolonged hospital course. Voicing that she just wants to go home and be done with it all. Patient agreeable to speak to a Chaplain. RD reached out to on-call Chaplain who stated they will see patient today. RN aware.    Interventions: - 48 hour calorie count to start today, 11/12 and through tomorrow 11/13.  - Please document % intake of all foods, drinks, and nutrition supplements patient consumes on meal tickets and place in envelope on patient's door.  - If patient skips/refuses meals please document 0% for that meal.  - Discussed with RN.  - Soft diet per Surgery.  - Boost Plus po BID, each supplement provides 360 kcal and 14 grams of protein  - Would recommend continuing TPN until patient eating >50% of estimated needs.   Shelle Iron RD, LDN For contact information, refer to [facility].

## 2023-10-23 NOTE — Progress Notes (Signed)
PHARMACY - TOTAL PARENTERAL NUTRITION CONSULT NOTE   Indication:  Prolonged ileus  Patient Measurements: Height: 5\' 5"  (165.1 cm) Weight: 62 kg (136 lb 11 oz) IBW/kg (Calculated) : 57 TPN AdjBW (KG): 62 Body mass index is 22.75 kg/m. Usual Weight: 64.5 on 10/04/2023  Assessment:  TPN per Pharmacy consult for this 82 yo female admitted on 09/27/2023 with recurrent abdominal pain, nausea, emesis, diarrhea and intolerance of oral intake. Surgery completed exp lap with small bowel resection and Hartman's resection and colostomy on 10/01/2023.  Post-op ileus, 10/31 imaging showed anastomotic leak - drain placed by IR on 11/1 for abscess.  Glucose / Insulin: No hx DM. Goal CBG <180 -BG range: 112-132. 1 unit SSI/24 hrs Electrolytes: No new labs. -Na (128) remains low. All other lytes WNL, including CorrCa (9.6) -Bicarb, chloride both WNL Renal: SCr <1, BUN WNL Hepatic: LFTs, Alk Phos remain elevated. TG (273) now elevated -Albumin low Intake / Output; MIVF: no mIVF -UOP: 1700 mL. Drain output: 12.5 mL. LBM 11/9 GI Imaging: 10/10 CT A/P: small bowel enteritis/colitis that was also involving the sigmoid colon. No bowel obstruction seen.  10/17 CT A/P: wall thickening sigmoid colon worrisome for neoplasm. Focal colitis not excluded. Colonic dilation compatible with obstruction.  Cystic mass at the tail of the pancreas.  10/18 MR - 9 mm cystic lesion within pancreatic body/tail junction, colonic obstruction 10/31 CT A/P - colostomy and Hartmann's pouch seen.  Appears to be extravasation of contrast suggesting perforation or leakage. 11/8 CT A/P: No persistent anastomotic leak or fistula. GI Surgeries / Procedures:  10/19 flexible sigmoidoscopy 10/21 colectomy with colostomy creation/Hartman procedure 11/1: CT guided visceral fluid drain by perc cath  Central access: PICC line placed 11/1 TPN start date: 11/1  Nutritional Goals: Due to the Baxter IV fluid disruption, we will be  switching all adult compounded parenteral nutrition to premade Clinimix products +/- fat emulsion infusion. Our goal will be to continue providing as close to 100% of our patient's nutritional needs. Due to the volatile availability of different Clinimix concentrations we may not be able to meet 100% of adult patient nutritional goals at this time.    TPN:  Clinimix 8/10, 1L x 5 days per week and 2L x 2 days per week with daily lipids ( ) to provide 103 g protein and an average of 1353 kcal per day D10 previously infusing in addition to Clinimix to provide additional kcal, however, discontinued 11/9 given hyponatremia  RD Assessment:  Estimated Needs Total Energy Estimated Needs: 1900-2100 Total Protein Estimated Needs: 90-105g Total Fluid Estimated Needs: 2L/day  Current Nutrition:  Diet: Soft. Calorie count ordered. TPN  Plan:  Now: BG well controlled, only 1 unit SSI required in 24 hours. Discontinue CBG checks and SSI.  At 1800: Continue TPN with Clinimix E 8/10 + SMOF lipids Clinimix 2L on Monday, Thursday.  1L all other days of the week (41 mL/hr) Electrolytes in TPN: Unable to adjust Na 35 mEq/L, K 30 mEq/L, Ca 4.5 mEq/L, Mg 5 mEq/L, and Phos 15 mmol/L. Cl:Ac 1:1 Add standard MVI and trace elements to TPN, no chromium due to shortage No CBG checks/SSI  Monitor TPN labs on Mon/Thurs and as needed Follow ability to tolerate advancing diet for ability to wean off of TPN.  Cindi Carbon, PharmD 10/23/23 11:16 AM

## 2023-10-23 NOTE — Progress Notes (Signed)
   10/23/23 1000  Spiritual Encounters  Type of Visit Initial  Care provided to: Patient  Referral source Chaplain assessment;Clinical staff  Reason for visit Religious ritual  OnCall Visit No  Spiritual Framework  Presenting Themes Meaning/purpose/sources of inspiration;Goals in life/care;Values and beliefs;Significant life change;Caregiving needs;Coping tools;Impactful experiences and emotions;Courage hope and growth;Rituals and practive;Community and relationships  Values/beliefs [employer]  Community/Connection Faith community  Strengths Faith  Patient Stress Factors Exhausted;Family relationships;Loss of control;Major life changes  Family Stress Factors Loss;Major life changes  Interventions  Spiritual Care Interventions Made Established relationship of care and support;Compassionate presence;Reflective listening;Normalization of emotions;Explored ethical dilemma;Decision-making support/facilitation;Reconciliation with self/others;Narrative/life review;Explored values/beliefs/practices/strengths  Intervention Outcomes  Outcomes Connection to spiritual care;Autonomy/agency;Connection to values and goals of care  Spiritual Care Plan  Spiritual Care Issues Still Outstanding Chaplain will continue to follow   Chaplain met with patient after RD page for spiritual care for patient.  Elizabeth Odom spoke at length regarding her spiritual and emotional and physical challenges of her health at this time.  Sh e spoke of her "wanting to go home and be at rest and not fight medically anymore"  She spoke of her life narrative, her faith and how it has helped her and her desire to share her choices with her family.  She expressed emotional feelings regarding her children a nd grand children.  She spoke of her desire to connect with her [name]) at [facility] in [place].  She requested Chaplian to contact her church on her behalf for a visit.   She spoke of her options of being at home and pain free.    Chaplain spoke with RN staff and local Minister at [facility] @ [phone] Jetty Peeks will follow up with a personal visit.    Respectfully Submitted,   Rev. Dyke Brackett

## 2023-10-23 NOTE — Progress Notes (Signed)
Occupational Therapy Treatment Patient Details Name: Elizabeth Odom MRN: [id number] DOB: [birth year] Today's Date: 10/23/2023   History of present illness Patient is a 82 year old female who presented on 10/17 with acute colitis. on10/21, patient underwent exploratory laparotomy with hartman's resection and colostomy and small bowel resection. PMH: a fib, RLS, syncope, cervical CA, cervical spine fusions, anemia   OT comments  The reported recently receiving pain medication, though still experiencing 9/10 abdominal pain at rest. As such, her overall activity tolerance was compromised today and she was unable to perform progressive out of bed activity. She was educated on repositioning and rolling in bed, for which she required moderate assistance. OT further educated her on simple ROM and therapeutic exercises in bed, to promote joint flexibility and general muscle strengthening. She was able to perform simple self-feeding in bed with set-up assist. Continue OT plan of care. Patient will benefit from continued inpatient follow up therapy, <3 hours/day.       If plan is discharge home, recommend the following:  A lot of help with bathing/dressing/bathroom;Assistance with cooking/housework;Assist for transportation;A lot of help with walking and/or transfers;Direct supervision/assist for medications management   Equipment Recommendations  Other (comment) (defer to next level of care)    Recommendations for Other Services      Precautions / Restrictions Precautions Precautions: Fall Precaution Comments: contact precautions Restrictions Other Position/Activity Restrictions: abdominal surgery, colostomy       Mobility Bed Mobility Overal bed mobility: Needs Assistance Bed Mobility: Rolling Rolling: Mod assist         General bed mobility comments: The pt was instructed on trunk shifting, BLE positioning, and using bed rail to perform    Transfers        General transfer  comment: not attempted, due to pt reporting significant pain         ADL either performed or assessed with clinical judgement   ADL Overall ADL's : Needs assistance/impaired Eating/Feeding: Set up Eating/Feeding Details (indicate cue type and reason): The pt was observed drinking from a cup on several instances while at bed level.                 Lower Body Dressing: Total assistance Lower Body Dressing Details (indicate cue type and reason): for sock management  in the bed                     Cognition Arousal: Alert Behavior During Therapy: [facility] for tasks assessed/performed Overall Cognitive Status: Within Functional Limits for tasks assessed                        Pertinent Vitals/ Pain       Pain Assessment Pain Assessment: 0-10 Pain Score: 8  Pain Location: abdomen Pain Descriptors / Indicators: Grimacing Pain Intervention(s): Limited activity within patient's tolerance, Monitored during session, Other (comment) (She reported recently receiving pain medication.)         Frequency  Min 1X/week        Progress Toward Goals  OT Goals(current goals can now be found in the care plan section)     Acute Rehab OT Goals OT Goal Formulation: With patient Time For Goal Achievement: 11/02/23 Potential to Achieve Goals: Good  Plan      AM-PAC OT "6 Clicks" Daily Activity     Outcome Measure   Help from another person eating meals?: A Little Help from another person taking care of personal grooming?: A  Little Help from another person toileting, which includes using toliet, bedpan, or urinal?: Total Help from another person bathing (including washing, rinsing, drying)?: A Lot Help from another person to put on and taking off regular upper body clothing?: A Lot Help from another person to put on and taking off regular lower body clothing?: Total 6 Click Score: 12    End of Session    OT Visit Diagnosis: Muscle weakness (generalized)  (M62.81);Pain   Activity Tolerance Patient limited by pain   Patient Left in bed;with call bell/phone within reach;with bed alarm set   Nurse Communication Other (comment)        Time: 2951-8841 OT Time Calculation (min): 14 min  Charges: OT General Charges $OT Visit: 1 Visit OT Treatments $Therapeutic Activity: 8-22 mins     Reuben Likes, OTR/L 10/23/2023, 3:23 PM

## 2023-10-23 NOTE — Progress Notes (Signed)
PT Cancellation Note  Patient Details Name: Elizabeth Odom MRN: [id number] DOB: [birth year]   Cancelled Treatment:     AM cancel: cancelled due to increased abdominal pain, will check back this PM.     PM cancel: cancelled due to pt receiving an EKG, will attempt to see another day as schedule permits.   Lazaro Arms, SPTA 10/23/2023, 3:34 PM

## 2023-10-23 NOTE — Progress Notes (Signed)
Progress Note  22 Days Post-Op  Subjective: Pt reports she ate some rice and chicken last night but just a small amount. Denies nausea or vomiting. Not much output from ostomy. Some leakage around drain transgluteally.   Objective: Vital signs in last 24 hours: Temp:  [98.2 F (36.8 C)] 98.2 F (36.8 C) (11/12 0535) Pulse Rate:  [66-79] 79 (11/11 1943) Resp:  [16-20] 20 (11/12 0535) BP: (94-120)/(57-79) 120/64 (11/12 0535) SpO2:  [95 %-97 %] 97 % (11/12 0535) Weight:  [62 kg] 62 kg (11/12 0500) Last BM Date : 10/20/23  Intake/Output from previous day: 11/11 0701 - 11/12 0700 In: 1586.3 [I.V.:1576.3] Out: 1712.5 [Urine:1700; Drains:12.5] Intake/Output this shift: No intake/output data recorded.  PE: General: pleasant, WD, thin female who is laying in bed in NAD Heart: regular, rate, and rhythm.   Lungs: Respiratory effort nonlabored Abd: midline wound clean with granulation tissue, JP bulb with scant bilious fluid, ND, stoma viable with gas in bag Psych: A&Ox3 with an appropriate affect.    Lab Results:  Recent Labs    10/23/23 0312  WBC 7.7  HGB 10.1*  HCT 31.5*  PLT 270   BMET Recent Labs    10/21/23 0400 10/22/23 0354  NA 131* 128*  K 3.8 3.7  CL 103 100  CO2 23 22  GLUCOSE 126* 121*  BUN 15 18  CREATININE 0.49 0.65  CALCIUM 8.2* 8.1*   PT/INR No results for input(s): "LABPROT", "INR" in the last 72 hours. CMP     Component Value Date/Time   NA 128 (L) 10/22/2023 0354   K 3.7 10/22/2023 0354   CL 100 10/22/2023 0354   CO2 22 10/22/2023 0354   GLUCOSE 121 (H) 10/22/2023 0354   BUN 18 10/22/2023 0354   CREATININE 0.65 10/22/2023 0354   CALCIUM 8.1 (L) 10/22/2023 0354   PROT 6.2 (L) 10/22/2023 0354   ALBUMIN 2.1 (L) 10/22/2023 0354   AST 48 (H) 10/22/2023 0354   ALT 59 (H) 10/22/2023 0354   ALKPHOS 205 (H) 10/22/2023 0354   BILITOT 0.6 10/22/2023 0354   GFRNONAA >60 10/22/2023 0354   GFRAA >60 04/10/2016 1352   Lipase     Component  Value Date/Time   LIPASE 41 09/27/2023 2007       Studies/Results: IR Sinus/Fist Tube Chk-Non GI  Result Date: 10/22/2023 CLINICAL DATA:  Pelvic abscess s/p drainage catheter placement 10/12/2023. EXAM: DRAIN INJECTION/FISTULOGRAM/ABSCESSOGRAM COMPARISON:  Pelvic XR, earlier same day. IR CT, 10/12/2023. CT AP, 10/19/2023 and 10/11/2023. CONTRAST:  10 mL Omnipaque 300-administered via the existing percutaneous drain. FLUOROSCOPY TIME:  Fluoroscopic dose; 20 mGy TECHNIQUE: The patient was positioned LEFT lateral decubitus on the fluoroscopy table. A preprocedural spot fluoroscopic image was obtained of the pelvis and the existing percutaneous drainage catheter. Multiple spot fluoroscopic and radiographic images were obtained following the injection of a small amount of contrast via the existing percutaneous drainage catheter. FINDINGS: *Similar positioning of RIGHT transgluteal-approach pelvic drainage catheter. *Drainage catheter contrast injection without significant residual pelvic collection. *Contrast opacification along the anastomotic staple line and adjacent colon. IMPRESSION: RIGHT pelvic drainage catheter with contrast opacification of the anastomotic staple line and adjacent colon. Findings highly suspicious for fistulous communication with the colonic anastomosis Electronically Signed   By: Roanna Banning M.D.   On: 10/22/2023 15:55   DG Pelvis Portable  Result Date: 10/22/2023 CLINICAL DATA:  [id number] Pelvic abscess in female [id number] EXAM: PORTABLE PELVIS 1-2 VIEWS COMPARISON:  IR drain injection, concurrent. CT AP, 10/19/2023 and  10/11/2023. IR CT, 10/12/2023. FINDINGS: RIGHT pelvic drainage catheter with pigtail tip projecting near midline and lateral to the anastomotic staple line. Contrast opacification along the colon and line. Prior intraluminal contrast opacification of ascending and transverse colon. No interval osseous abnormality. IMPRESSION: RIGHT pelvic drainage catheter with  contrast opacification of the colon and staple line. Findings highly suspicious for fistulous communication with anastomosis. Electronically Signed   By: Roanna Banning M.D.   On: 10/22/2023 11:21    Anti-infectives: Anti-infectives (From admission, onward)    Start     Dose/Rate Route Frequency Ordered Stop   10/15/23 1800  vancomycin (VANCOCIN) IVPB 1000 mg/200 mL premix  Status:  Discontinued        1,000 mg 200 mL/hr over 60 Minutes Intravenous Every 24 hours 10/14/23 1740 10/15/23 1045   10/15/23 1430  piperacillin-tazobactam (ZOSYN) IVPB 3.375 g        3.375 g 100 mL/hr over 30 Minutes Intravenous  Once 10/15/23 1343 10/15/23 1503   10/15/23 1400  DAPTOmycin (CUBICIN) IVPB 500 mg/65mL premix  Status:  Discontinued        8 mg/kg  64.1 kg 100 mL/hr over 30 Minutes Intravenous Daily 10/15/23 1045 10/22/23 1321   10/14/23 1800  vancomycin (VANCOREADY) IVPB 1250 mg/250 mL        1,250 mg 166.7 mL/hr over 90 Minutes Intravenous  Once 10/14/23 1740 10/14/23 2255   10/14/23 1400  piperacillin-tazobactam (ZOSYN) IVPB 3.375 g  Status:  Discontinued        3.375 g 12.5 mL/hr over 240 Minutes Intravenous Every 8 hours 10/14/23 0845 10/22/23 1321   10/14/23 0900  piperacillin-tazobactam (ZOSYN) IVPB 3.375 g        3.375 g 100 mL/hr over 30 Minutes Intravenous NOW 10/14/23 0844 10/14/23 1002   10/05/23 1200  piperacillin-tazobactam (ZOSYN) IVPB 3.375 g        3.375 g 12.5 mL/hr over 240 Minutes Intravenous Every 8 hours 10/05/23 0721 10/09/23 1716   10/04/23 2000  piperacillin-tazobactam (ZOSYN) IVPB 3.375 g  Status:  Discontinued        3.375 g 12.5 mL/hr over 240 Minutes Intravenous Every 8 hours 10/04/23 1827 10/05/23 0721   09/27/23 2315  piperacillin-tazobactam (ZOSYN) IVPB 3.375 g  Status:  Discontinued        3.375 g 12.5 mL/hr over 240 Minutes Intravenous Every 8 hours 09/27/23 2305 10/02/23 1106        Assessment/Plan  POD22 s/p Hartman's resection and colostomy with SBR for  diverticular stricture Dr. [name] - CT 10/24 with hematoma in the pelvis adjacent to small bowel anastomosis and ileus vs obstruction  - IR 11/1: drain placement for small bowel anastomotic leak/ abscess drainage - CT 11/8 with resolution in previous right hemipelvic collection and no leakage of enteric contrast - drain injection study 11/11 confirms fistulous connection - drain left in place - soft diet and TPN, calorie count, supplements - getting in enough nutritionally will be key to trying to heal fistula  - stomal suppositories and encourage mobilization as able, added miralax today as well - continue daily dressing changes     FEN: PICC/TPN; soft diet  VTE: IVC Filter  ID: no current abx   - per TR[facility] -  ABL anemia secondary to post-op hematomas and hematuria  Acute PE and LLE DVT Pancreatic tail cyst Thyroid nodule  Chronic HFpEF Paroxysmal A. Fib Pre-diabetes RLS   LOS: 26 days    Juliet Rude, [facility] 10/23/2023, 9:08 AM  Please see Amion for pager number during day hours 7:00am-4:30pm

## 2023-10-24 ENCOUNTER — Ambulatory Visit: Payer: PPO | Admitting: Internal Medicine

## 2023-10-24 DIAGNOSIS — K56609 Unspecified intestinal obstruction, unspecified as to partial versus complete obstruction: Secondary | ICD-10-CM | POA: Diagnosis not present

## 2023-10-24 LAB — CBC
HCT: 32.1 % — ABNORMAL LOW (ref 36.0–46.0)
Hemoglobin: 10.3 g/dL — ABNORMAL LOW (ref 12.0–15.0)
MCH: 30.7 pg (ref 26.0–34.0)
MCHC: 32.1 g/dL (ref 30.0–36.0)
MCV: 95.8 fL (ref 80.0–100.0)
Platelets: 265 10*3/uL (ref 150–400)
RBC: 3.35 MIL/uL — ABNORMAL LOW (ref 3.87–5.11)
RDW: 15.8 % — ABNORMAL HIGH (ref 11.5–15.5)
WBC: 6.9 10*3/uL (ref 4.0–10.5)
nRBC: 0 % (ref 0.0–0.2)

## 2023-10-24 LAB — BASIC METABOLIC PANEL
Anion gap: 6 (ref 5–15)
BUN: 21 mg/dL (ref 8–23)
CO2: 21 mmol/L — ABNORMAL LOW (ref 22–32)
Calcium: 8.3 mg/dL — ABNORMAL LOW (ref 8.9–10.3)
Chloride: 104 mmol/L (ref 98–111)
Creatinine, Ser: 0.44 mg/dL (ref 0.44–1.00)
GFR, Estimated: >60 mL/min (ref >60)
Glucose, Bld: 119 mg/dL — ABNORMAL HIGH (ref 70–99)
Potassium: 3.9 mmol/L (ref 3.5–5.1)
Sodium: 131 mmol/L — ABNORMAL LOW (ref 135–145)

## 2023-10-24 LAB — MAGNESIUM: Magnesium: 1.9 mg/dL (ref 1.7–2.4)

## 2023-10-24 LAB — PHOSPHORUS: Phosphorus: 2.8 mg/dL (ref 2.5–4.6)

## 2023-10-24 MED ORDER — FAT EMUL FISH OIL/PLANT BASED 20% (SMOFLIPID)IV EMUL
250.0000 mL | INTRAVENOUS | Status: AC
  Administered 2023-10-24: 250 mL via INTRAVENOUS
  Filled 2023-10-24: qty 250

## 2023-10-24 MED ORDER — TRACE MINERALS CU-MN-SE-ZN 300-55-60-3000 MCG/ML IV SOLN
INTRAVENOUS | Status: AC
  Filled 2023-10-24 (×2): qty 984

## 2023-10-24 MED ORDER — HYDROMORPHONE HCL 1 MG/ML IJ SOLN
0.5000 mg | INTRAMUSCULAR | Status: DC | PRN
  Administered 2023-10-24 – 2023-10-29 (×12): 0.5 mg via INTRAVENOUS
  Filled 2023-10-24 (×12): qty 0.5

## 2023-10-24 NOTE — Progress Notes (Signed)
PROGRESS NOTE    Elizabeth Odom  ZOX:[id number] DOB: [birth year] DOA: 09/27/2023 PCP: [name], FNP   Brief Narrative: 82 year old with past medical history significant for A-fib, RLS, anemia presented to the ED 10/17 with recurrent abdominal pain, nausea emesis, diarrhea intolerance of oral intake.  She was evaluated in the ED 10/10 for abdominal pain, decreased appetite, fever, diagnosed with possible colitis noted on CT abdomen and pelvis discharged on Augmentin.  She had recurrence of symptoms and presented back to the hospital.  CT initially showed proximal sigmoid colon wall thickening with upstream colonic dilation with air-fluid levels and large amount of stool compatible with obstruction.  9 mm cystic lesion in the tail of the pancreas and subcentimeter lucent lesion in L1 vertebral body.  GI consulted, patient refused colonoscopy but agreed to sigmoidoscopy showing stricture versus mass at the rectosigmoid region.  Scope could not be advanced past this.  Surgery was consulted and she underwent a small bowel resection and Hartman's resection and  colostomy on 10/01/2023.  She continued to have postoperative ileus managed by general surgery.  NG remains in place to suction.  Hospital course also complicated by acute PE and left lower extremity DVT she was started on heparin.  She subsequently developed acute blood loss anemia and subsequent pelvic hematoma near the small bowel anastomosis as well as notable hematuria.  Urology was consulted.  Signed off after hematuria resolution.  Given high risk for bleeding with notable PE DVT evaluated by IR and placed IVC filter 10/05/2023.  Patient required total of 4 units of packed red blood cell.  As her bowel function did not return she underwent a repeat CT abdomen and pelvis on 10/31st showing anastomotic leak into 7 x 6 cm complex fluid collection posteriorly in the pelvis.  IR was consulted and underwent CT-guided percutaneous drain placement  into pelvic abscess.  Fluid grew MRSA.  ID consulted and once transitioned to daptomycin and Zosyn.  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  ID recommended no further need of antibiotics.  Awaiting his SNF currently   Assessment & Plan:   Principal Problem:   Large bowel obstruction (HCC) Active Problems:   Acute colitis   Left leg DVT (HCC)   ABLA (acute blood loss anemia)   Palliative care encounter   Counseling and coordination of care   Need for emotional support   Goals of care, counseling/discussion   High risk medication use   Pain   Medication management   Pressure injury of skin   Malnutrition of moderate degree  1-Large bowel obstruction Postop.  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  -Patient presented with abdominal pain.  CT abdomen 10/17 proximal sigmoid colon thickening -GI consulted underwent sigmoidoscopy 10/19 which showed tight stricture found to have a mass, biopsy was negative for malignancy as suggesting inflammation. -Status post exploratory laparotomy Hartman's resection 10/21st with a small bowel resection and ostomy by Dr. [name]. -CT abdomen and pelvis 10/31st show a small leak at her anastomosis going into 6 x 7 cm fluid collection -Underwent drainage catheter placement of pelvic abscess by IR on 11/1 -Fluid grew MRSA, ID consulted.  She was treated with daptomycin and Zosyn. -Repeated CT abdomen and pelvis 11/7 show percutaneous pigtail drain catheter, no persistent anastomotic leak or fistula. -11/11 pelvic drain injection study showed fistulous communication with colon/staple line anastomosis.  Drain to remain in place. -Per ID  no further indication to continue antibiotics.  Antibiotics discontinued -Continue TPN, soft diet, surgery following   Hyponatremia D10 was  discontinued.  Continue to monitor sodium level   Acute blood loss anemia status post perianastomotic hematoma with hematuria Status post 4 units of packed red blood cell.  Status post IVC filter -Evaluated by urology no indication for cystoscopy or intervention Globin has remained stable   Acute PE without right heart strain with acute left DVT Unable to tolerate anticoagulation and status post IVC filter placement 10/25th Correction, no evidece of right heart strain on CT scan CT chest. ECHO normal RV function.   Thyroid nodule in the inferior aspect of the isthmus Outpatient follow-up  Paroxysmal A-fib Controlled.  Not on anticoagulation  Hypokalemia hypophosphatemia hypomagnesemia Replace as needed  Urinary retention: continue with foley catheter.   Hypotension; resolved  Leukocytosis Resolved.   Pressure injury documentation Local care.   Moderate protein caloric malnutrition On TPN    See wound care documentation below  Pressure Injury 10/08/23 Buttocks Right;Mid Stage 2 -  Partial thickness loss of dermis presenting as a shallow open injury with a red, pink wound bed without slough. (Active)  10/08/23 0800  Location: Buttocks  Location Orientation: Right;Mid  Staging: Stage 2 -  Partial thickness loss of dermis presenting as a shallow open injury with a red, pink wound bed without slough.  Wound Description (Comments):   Present on Admission:   Dressing Type Foam - Lift dressing to assess site every shift 10/23/23 2108     Nutrition Problem: Moderate Malnutrition Etiology: chronic illness    Signs/Symptoms: moderate fat depletion, severe muscle depletion    Interventions: Refer to RD note for recommendations, TPN  Estimated body mass index is 22.75 kg/m as calculated from the following:   Height as of this encounter: 5\' 5"  (1.651 m).   Weight as of this encounter: 62 kg.   DVT prophylaxis: SCD Code Status: DNR Family Communication: care  discussed with patient.  Disposition Plan:  Status is: Inpatient Remains inpatient appropriate because: management. Of SBP    Consultants:  Surgery Urology IR ID Procedures:  10/21 s/p ex lap hartman's resection , small bowel resection and ostomy.  IVC filter placement  Antimicrobials:   Subjective She report mild pain, she was just change.  Diet change to regular.   Objective: Vitals:   10/23/23 0535 10/23/23 1219 10/23/23 2205 10/24/23 0628  BP: 120/64 114/64 130/69 (!) 108/57  Pulse:  (!) 58 72 65  Resp: 20 16 17 17   Temp: 98.2 F (36.8 C) 97.8 F (36.6 C) 98.1 F (36.7 C) 98.2 F (36.8 C)  TempSrc: Oral Oral    SpO2: 97% 96% 96% 99%  Weight:      Height:        Intake/Output Summary (Last 24 hours) at 10/24/2023 0811 Last data filed at 10/24/2023 1610 Gross per 24 hour  Intake 1319.94 ml  Output 1908 ml  Net -588.06 ml   Filed Weights   10/20/23 0500 10/21/23 0500 10/23/23 0500  Weight: 61 kg 61 kg 62 kg    Examination:  General exam: Appears calm and comfortable  Respiratory system: Clear to auscultation. Respiratory effort normal. Cardiovascular system: S1 & S2 heard, RRR. No JVD, murmurs, rubs, gallops or clicks. No pedal edema. Gastrointestinal system: Abdomen is nondistended, soft and nontender. No organomegaly or masses felt. Normal bowel sounds heard. Central nervous system: Alert and oriented. No focal neurological deficits.  Extremities: Symmetric 5 x 5 power. Skin: No rashes, lesions or ulcers Psychiatry: Judgement and insight appear normal. Mood & affect appropriate.     Data Reviewed: I have personally reviewed following labs and imaging studies  CBC: Recent Labs  Lab 10/17/23 0815 10/19/23 0414 10/20/23 0549 10/23/23 0312 10/24/23 0334  WBC 8.3 7.2 8.6 7.7 6.9  NEUTROABS 5.7  --   --   --   --   HGB 9.9* 10.3* 10.4* 10.1* 10.3*  HCT 30.7* 32.2* 31.9* 31.5* 32.1*  MCV 95.9 95.8 95.8 96.3 95.8  PLT 290 318 312 270 265    Basic Metabolic Panel: Recent Labs  Lab 10/19/23 0414 10/20/23 0549 10/21/23 0400 10/22/23 0354 10/23/23 1846 10/24/23 0334  NA 128* 125* 131* 128*  --  131*  K 3.8 3.6 3.8 3.7 4.1 3.9  CL 103 98 103 100  --  104  CO2 20* 20* 23 22  --  21*  GLUCOSE 123* 137* 126* 121*  --  119*  BUN 19 17 15 18   --  21  CREATININE 0.46 0.61 0.49 0.65  --  0.44  CALCIUM 7.7* 7.8* 8.2* 8.1*  --  8.3*  MG 2.0 1.8 2.1 1.9 1.9 1.9  PHOS 2.3* 2.3* 2.5 2.9  --  2.8   GFR: Estimated Creatinine Clearance: 47.9 mL/min (by C-G formula based on SCr of 0.44 mg/dL). Liver Function Tests: Recent Labs  Lab 10/17/23 0815 10/18/23 0533 10/22/23 0354  AST 42* 45* 48*  ALT 44 49* 59*  ALKPHOS 108 148* 205*  BILITOT 0.6 0.7 0.6  PROT 5.5* 6.1* 6.2*  ALBUMIN 2.1* 2.2* 2.1*   No results for input(s): "LIPASE", "AMYLASE" in the last 168 hours. No results for input(s): "AMMONIA" in the last 168 hours. Coagulation Profile: No results for input(s): "INR", "PROTIME" in the last 168 hours. Cardiac Enzymes: No results for input(s): "CKTOTAL", "CKMB", "CKMBINDEX", "TROPONINI" in the last 168 hours. BNP (last 3 results) No results for input(s): "PROBNP" in the last 8760 hours. HbA1C: No results for input(s): "HGBA1C" in the last 72 hours. CBG: Recent Labs  Lab 10/22/23 0829 10/22/23 1707 10/22/23 2352 10/23/23 0729 10/23/23 1632  GLUCAP 106* 112* 130* 132* 122*   Lipid Profile: Recent Labs    10/22/23 0354  TRIG 273*   Thyroid Function Tests: No results for input(s): "TSH", "T4TOTAL", "FREET4", "T3FREE", "THYROIDAB" in the last 72 hours. Anemia Panel: No results for input(s): "VITAMINB12", "FOLATE", "FERRITIN", "TIBC", "IRON", "RETICCTPCT" in the last 72 hours. Sepsis Labs: No results for input(s): "PROCALCITON", "LATICACIDVEN" in the last 168 hours.  No results found for this or any previous visit (from the past 240 hour(s)).       Radiology Studies: IR Sinus/Fist Tube Chk-Non  GI  Result Date: 10/22/2023 CLINICAL DATA:  Pelvic abscess s/p drainage catheter placement 10/12/2023. EXAM: DRAIN INJECTION/FISTULOGRAM/ABSCESSOGRAM COMPARISON:  Pelvic XR, earlier same day. IR CT, 10/12/2023. CT AP, 10/19/2023 and 10/11/2023. CONTRAST:  10 mL Omnipaque 300-administered via the existing percutaneous drain. FLUOROSCOPY TIME:  Fluoroscopic dose; 20 mGy TECHNIQUE: The patient was positioned LEFT lateral decubitus on the fluoroscopy table. A preprocedural spot fluoroscopic image was obtained of the pelvis and the existing percutaneous drainage catheter. Multiple spot fluoroscopic and radiographic images were obtained following the injection of a small amount of contrast via the existing percutaneous drainage catheter. FINDINGS: *Similar positioning of RIGHT transgluteal-approach pelvic drainage catheter. *Drainage catheter contrast injection without significant residual pelvic collection. *Contrast opacification along the anastomotic staple line and adjacent colon.  IMPRESSION: RIGHT pelvic drainage catheter with contrast opacification of the anastomotic staple line and adjacent colon. Findings highly suspicious for fistulous communication with the colonic anastomosis Electronically Signed   By: Roanna Banning M.D.   On: 10/22/2023 15:55   DG Pelvis Portable  Result Date: 10/22/2023 CLINICAL DATA:  [id number] Pelvic abscess in female [id number] EXAM: PORTABLE PELVIS 1-2 VIEWS COMPARISON:  IR drain injection, concurrent. CT AP, 10/19/2023 and 10/11/2023. IR CT, 10/12/2023. FINDINGS: RIGHT pelvic drainage catheter with pigtail tip projecting near midline and lateral to the anastomotic staple line. Contrast opacification along the colon and line. Prior intraluminal contrast opacification of ascending and transverse colon. No interval osseous abnormality. IMPRESSION: RIGHT pelvic drainage catheter with contrast opacification of the colon and staple line. Findings highly suspicious for fistulous communication  with anastomosis. Electronically Signed   By: Roanna Banning M.D.   On: 10/22/2023 11:21        Scheduled Meds:  bisacodyl  10 mg Rectal Daily   Chlorhexidine Gluconate Cloth  6 each Topical Daily   docusate  100 mg Oral BID   ketotifen  2 drop Both Eyes BID   lactose free nutrition  237 mL Oral BID BM   polyethylene glycol  17 g Oral Daily   sodium chloride flush  10-40 mL Intracatheter Q12H   sodium chloride flush  5 mL Intracatheter Q8H   sodium hypochlorite   Irrigation BID   Continuous Infusions:  TPN (CLINIMIX-E) Adult 41 mL/hr at 10/23/23 1801     LOS: 27 days    Time spent: 35 minutes    Cathleen Yagi A Abb Gobert, MD [facility]   If 7PM-7AM, please contact night-coverage [email or web address]  10/24/2023, 8:11 AM

## 2023-10-24 NOTE — Evaluation (Signed)
Clinical/Bedside Swallow Evaluation Patient Details  Name: Elizabeth Odom MRN: [id number] Date of Birth: [birth year]  Today's Date: 10/24/2023 Time: SLP Start Time (ACUTE ONLY): 0810 SLP Stop Time (ACUTE ONLY): 0835 SLP Time Calculation (min) (ACUTE ONLY): 25 min  Past Medical History:  Past Medical History:  Diagnosis Date   A-fib (HCC) 04/09/2016   Acute head injury 04/09/2016   Cervical cancer (HCC)    Cervical spine fracture, initial encounter 04/09/2016   Essential hypertension    History of anemia    Multiple fractures of cervical spine (HCC) 04/09/2016   PAC (premature atrial contraction)    Restless leg    Syncope and collapse 04/09/2016   Past Surgical History:  Past Surgical History:  Procedure Laterality Date   APPENDECTOMY     BIOPSY  09/29/2023   Procedure: BIOPSY;  Surgeon: Kerin Salen, MD;  Location: [facility];  Service: Gastroenterology;;   breast lump removal Left    CERVICAL LAMINECTOMY     COLECTOMY WITH COLOSTOMY CREATION/HARTMANN PROCEDURE N/A 10/01/2023   Procedure: COLECTOMY WITH COLOSTOMY CREATION/HARTMANN PROCEDURE;  Surgeon: Axel Filler, MD;  Location: [facility];  Service: General;  Laterality: N/A;   complete hysterectomy     EP IMPLANTABLE DEVICE N/A 04/10/2016   Procedure: Loop Recorder Insertion;  Surgeon: Marinus Maw, MD;  Location: [facility];  Service: Cardiovascular;  Laterality: N/A;   FLEXIBLE SIGMOIDOSCOPY N/A 09/29/2023   Procedure: FLEXIBLE SIGMOIDOSCOPY;  Surgeon: Kerin Salen, MD;  Location: [facility];  Service: Gastroenterology;  Laterality: N/A;   HARVEST BONE GRAFT     IR IVC FILTER PLMT / S&I /IMG GUID/MOD SED  10/05/2023   IR SINUS/FIST TUBE CHK-NON GI  10/22/2023   LOOP RECORDER REMOVAL N/A 07/08/2018   Procedure: LOOP RECORDER REMOVAL;  Surgeon: Marinus Maw, MD;  Location: [facility];  Service: Cardiovascular;  Laterality: N/A;   HPI:  82 year old female admitted to [facility] long hospital and underwent  Hartman's resection" colostomy with SBR for diverticular stricture.  She has had a prolonged course in the hospital and is receiving TPN as well as some p.o.  Past medical history significant for fall 2017, cervical spine fracture, solid cervical spine fusion.  Swallow evaluation ordered presume due to patient's poor intake.  Patient advised SLP that she has speech and swallowing difficulties due to xerostomia.    Assessment / Plan / Recommendation  Clinical Impression  Limited swallow evaluation due to pt only accepting minimal po intake.  Pt reports some discomfort with attempting to sit upright, thus consumed minimal amount with HOB partially reclined (approx 45*).  Reports her issue is xerostomia - causing speech and swallow issues.  No focal CN deficits noted and pt consume few bites/sips only including water and graham cracker.  With large sequential swallows with water - cough noted post-swallow - likely due to decreased ability to sustain airway closure.   Chin slightly down with small boluses tolerated well.  Slightly prolonged mastication but adequate oral clearance.  Pt articulated that she is "thinking about stopping treatment" stating "she has a right to decide her own care". Encouraged her to have conversation with her MD and family re: this issue.  Recommend diet as tolerated - SLP will sign off after note filed. Thanks for this consult. SLP Visit Diagnosis: Dysphagia, unspecified (R13.10)    Aspiration Risk       Diet Recommendation Regular;Thin liquid    Medication Administration: Whole meds with liquid Supervision: Patient able to self feed Compensations:  Slow rate;Small sips/bites (start intake with liquids)    Other  Recommendations Oral Care Recommendations: Oral care BID    Recommendations for follow up therapy are one component of a multi-disciplinary discharge planning process, led by the attending physician.  Recommendations may be updated based on patient status,  additional functional criteria and insurance authorization.  Follow up Recommendations No SLP follow up      Assistance Recommended at Discharge    Functional Status Assessment Patient has not had a recent decline in their functional status  Frequency and Duration            Prognosis        Swallow Study   General Date of Onset: 10/24/23 HPI: 82 year old female admitted to [facility] long hospital and underwent Hartman's resection" colostomy with SBR for diverticular stricture.  She has had a prolonged course in the hospital and is receiving TPN as well as some p.o.  Past medical history significant for fall 2017, cervical spine fracture, solid cervical spine fusion.  Swallow evaluation ordered presume due to patient's poor intake.  Patient advised SLP that she has speech and swallowing difficulties due to xerostomia. Diet Prior to this Study: Dysphagia 3 (mechanical soft);Thin liquids (Level 0) Respiratory Status: Room air History of Recent Intubation: No Behavior/Cognition: Alert;Cooperative Oral Care Completed by SLP: No Oral Cavity - Dentition: Adequate natural dentition Vision: Functional for self-feeding Baseline Vocal Quality: Normal Volitional Cough: Strong Volitional Swallow: Able to elicit    Oral/Motor/Sensory Function Overall Oral Motor/Sensory Function: Within functional limits   Ice Chips Ice chips: Not tested Other Comments: Patient politely declined to try any ice stating she never wants to see another ice chip due to overconsumption   Thin Liquid Thin Liquid: Impaired Presentation: Self Fed;Straw Pharyngeal  Phase Impairments: Throat Clearing - Immediate;Throat Clearing - Delayed (very subtle cough)    Nectar Thick Nectar Thick Liquid: Not tested   Honey Thick Honey Thick Liquid: Not tested   Puree Puree: Within functional limits [facility])   Solid     Solid: Within functional limits Presentation: Self Fed      Elizabeth Odom 10/24/2023,12:47 PM  Rolena Infante, MS [facility] SLP Acute Rehab Services Office [phone]

## 2023-10-24 NOTE — Progress Notes (Signed)
Calorie Count Note  48 hour calorie count ordered.  Diet: Soft Supplements: Boost Plus BID  Day 1 (11/12): Breakfast: 0% Lunch: 0% Dinner: 0% Snakcs: 5 push up [place] ices = 350 kcals Supplements: 1 Boost Plus (per pt report) = 360 kcals, 14g protein *Nothing documented in calorie count envelope so all intake taken from patient report.  Total intake: 710 kcal (37% of minimum estimated needs)  14 protein (15% of minimum estimated needs)  Subjective: Patient reports not having had anything to eat yesterday because she can't order due to being unable to see without her glasses. Will add assistance with ordering meals. She endorses having had 5 italian ice pops and 1 Boost Plus. However, reports she really doesn't like the Boost Plus and it doesn't agree with her so would like to stop receiving it. Doesn't like Ensure and doesn't want to try Boost Breeze but agreeable to try [employer].  Nutrition Dx:  Moderate Malnutrition related to chronic illness as evidenced by moderate fat depletion, severe muscle depletion.   Goal: Patient will meet greater than or equal to 90% of their needs   Intervention:  - Would recommend continuing TPN at this time due to ongoing inadequate intake.              - TPN management per pharmacy.    - Daily weights while on TPN.    - Soft per Surgery.  - Magic cup TID with meals, each supplement provides 290 kcal and 9 grams of protein   Shelle Iron RD, LDN For contact information, refer to [facility].

## 2023-10-24 NOTE — Progress Notes (Signed)
PHARMACY - TOTAL PARENTERAL NUTRITION CONSULT NOTE   Indication:  Prolonged ileus  Patient Measurements: Height: 5\' 5"  (165.1 cm) Weight: 62 kg (136 lb 11 oz) IBW/kg (Calculated) : 57 TPN AdjBW (KG): 62 Body mass index is 22.75 kg/m. Usual Weight: 64.5 on 10/04/2023  Assessment:  TPN per Pharmacy consult for this 82 yo female admitted on 09/27/2023 with recurrent abdominal pain, nausea, emesis, diarrhea and intolerance of oral intake. Surgery completed exp lap with small bowel resection and Hartman's resection and colostomy on 10/01/2023.  Post-op ileus, 10/31 imaging showed anastomotic leak - drain placed by IR on 11/1 for abscess.  Glucose / Insulin: No hx DM. Goal CBG <180 -BG: 119. SSI discontinued Electrolytes: Na (131) remains low.  -All other lytes WNL, including CorrCa (9.8) -Bicarb slightly low, chloride WNL Renal: SCr <1, BUN WNL Hepatic: LFTs, Alk Phos remain elevated. TG (273) elevated -Albumin low Intake / Output; MIVF: no mIVF -UOP: 1900 mL. Drain output: 18 mL. LBM 11/9 GI Imaging: 10/10 CT A/P: small bowel enteritis/colitis that was also involving the sigmoid colon. No bowel obstruction seen.  10/17 CT A/P: wall thickening sigmoid colon worrisome for neoplasm. Focal colitis not excluded. Colonic dilation compatible with obstruction.  Cystic mass at the tail of the pancreas.  10/18 MR - 9 mm cystic lesion within pancreatic body/tail junction, colonic obstruction 10/31 CT A/P - colostomy and Hartmann's pouch seen.  Appears to be extravasation of contrast suggesting perforation or leakage. 11/8 CT A/P: No persistent anastomotic leak or fistula. GI Surgeries / Procedures:  10/19 flexible sigmoidoscopy 10/21 colectomy with colostomy creation/Hartman procedure 11/1: CT guided visceral fluid drain by perc cath  Central access: PICC line placed 11/1 TPN start date: 11/1  Nutritional Goals: Due to the Baxter IV fluid disruption, we will be switching all adult  compounded parenteral nutrition to premade Clinimix products +/- fat emulsion infusion. Our goal will be to continue providing as close to 100% of our patient's nutritional needs. Due to the volatile availability of different Clinimix concentrations we may not be able to meet 100% of adult patient nutritional goals at this time.    TPN:  Clinimix 8/10, 1L x 5 days per week and 2L x 2 days per week with daily lipids ( ) to provide 103 g protein and an average of 1353 kcal per day D10 previously infusing in addition to Clinimix to provide additional kcal, however, discontinued 11/9 given hyponatremia  RD Assessment:  Estimated Needs Total Energy Estimated Needs: 1900-2100 Total Protein Estimated Needs: 90-105g Total Fluid Estimated Needs: 2L/day  Current Nutrition:  Diet: Soft. Calorie count ongoing. TPN  Plan:   At 1800: Continue TPN with Clinimix E 8/10 + SMOF lipids Clinimix 2L on Monday, Thursday.  1L all other days of the week  Electrolytes in TPN: Unable to adjust Na 35 mEq/L, K 30 mEq/L, Ca 4.5 mEq/L, Mg 5 mEq/L, and Phos 15 mmol/L. Cl:Ac 1:1 Add standard MVI and trace elements to TPN, no chromium due to shortage No CBG checks/SSI  Monitor TPN labs on Mon/Thurs and as needed Follow ability to tolerate advancing diet for ability to wean off of TPN.  Cindi Carbon, PharmD 10/24/23 9:08 AM

## 2023-10-24 NOTE — Progress Notes (Signed)
Progress Note  23 Days Post-Op  Subjective: Long conversation with patient today regarding current status and plans from a surgical standpoint. She is very clear on current condition and indication for continued drain being fistula to small bowel anastomosis. She did not eat anything yesterday because no one helped her order any meals. She is willing to try to increase PO intake but did not have much appetite for scrambled eggs this morning. I spoke with her granddaughter yesterday per patient request. Her pastor is visiting with her this morning.   Objective: Vital signs in last 24 hours: Temp:  [97.8 F (36.6 C)-98.2 F (36.8 C)] 98.2 F (36.8 C) (11/13 0628) Pulse Rate:  [58-72] 65 (11/13 0628) Resp:  [16-17] 17 (11/13 0628) BP: (108-130)/(57-69) 108/57 (11/13 0628) SpO2:  [96 %-99 %] 99 % (11/13 0628) Last BM Date :  (PTA)  Intake/Output from previous day: 11/12 0701 - 11/13 0700 In: 1564.9 [P.O.:810; I.V.:739.9] Out: 1918 [Urine:1900; Drains:18] Intake/Output this shift: No intake/output data recorded.  PE: General: pleasant, WD, thin female who is laying in bed in NAD Heart: regular, rate, and rhythm.   Lungs: Respiratory effort nonlabored Abd: midline dressing clean, drain with scant clear brown fluid, ND, stoma viable with gas and tiny amount stool in bag Psych: A&Ox3 with an appropriate affect.    Lab Results:  Recent Labs    10/23/23 0312 10/24/23 0334  WBC 7.7 6.9  HGB 10.1* 10.3*  HCT 31.5* 32.1*  PLT 270 265   BMET Recent Labs    10/22/23 0354 10/23/23 1846 10/24/23 0334  NA 128*  --  131*  K 3.7 4.1 3.9  CL 100  --  104  CO2 22  --  21*  GLUCOSE 121*  --  119*  BUN 18  --  21  CREATININE 0.65  --  0.44  CALCIUM 8.1*  --  8.3*   PT/INR No results for input(s): "LABPROT", "INR" in the last 72 hours. CMP     Component Value Date/Time   NA 131 (L) 10/24/2023 0334   K 3.9 10/24/2023 0334   CL 104 10/24/2023 0334   CO2 21 (L) 10/24/2023  0334   GLUCOSE 119 (H) 10/24/2023 0334   BUN 21 10/24/2023 0334   CREATININE 0.44 10/24/2023 0334   CALCIUM 8.3 (L) 10/24/2023 0334   PROT 6.2 (L) 10/22/2023 0354   ALBUMIN 2.1 (L) 10/22/2023 0354   AST 48 (H) 10/22/2023 0354   ALT 59 (H) 10/22/2023 0354   ALKPHOS 205 (H) 10/22/2023 0354   BILITOT 0.6 10/22/2023 0354   GFRNONAA >60 10/24/2023 0334   GFRAA >60 04/10/2016 1352   Lipase     Component Value Date/Time   LIPASE 41 09/27/2023 2007       Studies/Results: IR Sinus/Fist Tube Chk-Non GI  Result Date: 10/22/2023 CLINICAL DATA:  Pelvic abscess s/p drainage catheter placement 10/12/2023. EXAM: DRAIN INJECTION/FISTULOGRAM/ABSCESSOGRAM COMPARISON:  Pelvic XR, earlier same day. IR CT, 10/12/2023. CT AP, 10/19/2023 and 10/11/2023. CONTRAST:  10 mL Omnipaque 300-administered via the existing percutaneous drain. FLUOROSCOPY TIME:  Fluoroscopic dose; 20 mGy TECHNIQUE: The patient was positioned LEFT lateral decubitus on the fluoroscopy table. A preprocedural spot fluoroscopic image was obtained of the pelvis and the existing percutaneous drainage catheter. Multiple spot fluoroscopic and radiographic images were obtained following the injection of a small amount of contrast via the existing percutaneous drainage catheter. FINDINGS: *Similar positioning of RIGHT transgluteal-approach pelvic drainage catheter. *Drainage catheter contrast injection without significant residual pelvic collection. *  Contrast opacification along the anastomotic staple line and adjacent colon. IMPRESSION: RIGHT pelvic drainage catheter with contrast opacification of the anastomotic staple line and adjacent colon. Findings highly suspicious for fistulous communication with the colonic anastomosis Electronically Signed   By: Roanna Banning M.D.   On: 10/22/2023 15:55   DG Pelvis Portable  Result Date: 10/22/2023 CLINICAL DATA:  [id number] Pelvic abscess in female [id number] EXAM: PORTABLE PELVIS 1-2 VIEWS COMPARISON:  IR  drain injection, concurrent. CT AP, 10/19/2023 and 10/11/2023. IR CT, 10/12/2023. FINDINGS: RIGHT pelvic drainage catheter with pigtail tip projecting near midline and lateral to the anastomotic staple line. Contrast opacification along the colon and line. Prior intraluminal contrast opacification of ascending and transverse colon. No interval osseous abnormality. IMPRESSION: RIGHT pelvic drainage catheter with contrast opacification of the colon and staple line. Findings highly suspicious for fistulous communication with anastomosis. Electronically Signed   By: Roanna Banning M.D.   On: 10/22/2023 11:21    Anti-infectives: Anti-infectives (From admission, onward)    Start     Dose/Rate Route Frequency Ordered Stop   10/15/23 1800  vancomycin (VANCOCIN) IVPB 1000 mg/200 mL premix  Status:  Discontinued        1,000 mg 200 mL/hr over 60 Minutes Intravenous Every 24 hours 10/14/23 1740 10/15/23 1045   10/15/23 1430  piperacillin-tazobactam (ZOSYN) IVPB 3.375 g        3.375 g 100 mL/hr over 30 Minutes Intravenous  Once 10/15/23 1343 10/15/23 1503   10/15/23 1400  DAPTOmycin (CUBICIN) IVPB 500 mg/71mL premix  Status:  Discontinued        8 mg/kg  64.1 kg 100 mL/hr over 30 Minutes Intravenous Daily 10/15/23 1045 10/22/23 1321   10/14/23 1800  vancomycin (VANCOREADY) IVPB 1250 mg/250 mL        1,250 mg 166.7 mL/hr over 90 Minutes Intravenous  Once 10/14/23 1740 10/14/23 2255   10/14/23 1400  piperacillin-tazobactam (ZOSYN) IVPB 3.375 g  Status:  Discontinued        3.375 g 12.5 mL/hr over 240 Minutes Intravenous Every 8 hours 10/14/23 0845 10/22/23 1321   10/14/23 0900  piperacillin-tazobactam (ZOSYN) IVPB 3.375 g        3.375 g 100 mL/hr over 30 Minutes Intravenous NOW 10/14/23 0844 10/14/23 1002   10/05/23 1200  piperacillin-tazobactam (ZOSYN) IVPB 3.375 g        3.375 g 12.5 mL/hr over 240 Minutes Intravenous Every 8 hours 10/05/23 0721 10/09/23 1716   10/04/23 2000  piperacillin-tazobactam  (ZOSYN) IVPB 3.375 g  Status:  Discontinued        3.375 g 12.5 mL/hr over 240 Minutes Intravenous Every 8 hours 10/04/23 1827 10/05/23 0721   09/27/23 2315  piperacillin-tazobactam (ZOSYN) IVPB 3.375 g  Status:  Discontinued        3.375 g 12.5 mL/hr over 240 Minutes Intravenous Every 8 hours 09/27/23 2305 10/02/23 1106        Assessment/Plan  POD23 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 [name] - CT 10/24 with hematoma in the pelvis adjacent to small bowel anastomosis and ileus vs obstruction  - IR 11/1: drain placement for small bowel anastomotic leak/ abscess drainage - CT 11/8 with resolution in previous right hemipelvic collection and no leakage of enteric contrast - drain injection study 11/11 confirms fistulous connection - drain left in place. Stop flushing drain and placed to gravity bag yesterday - reg diet and TPN, calorie count, supplements - getting in enough nutritionally will be key to  trying to heal fistula  - stomal suppositories and encourage mobilization as able, added miralax today as well - continue daily dressing changes     FEN: PICC/TPN; reg diet  VTE: IVC Filter  ID: no current abx   - per TRH -  ABL anemia secondary to post-op hematomas and hematuria  Acute PE and LLE DVT Pancreatic tail cyst Thyroid nodule  Chronic HFpEF Paroxysmal A. Fib Pre-diabetes RLS   LOS: 27 days    Juliet Rude, [facility] Surgery 10/24/2023, 10:41 AM Please see Amion for pager number during day hours 7:00am-4:30pm

## 2023-10-24 NOTE — Progress Notes (Addendum)
BSE completed, full report to follow.  Pt reports some discomfort with attempting to sit upright, thus consumed minimal amount with HOB partially reclined (approx 45*).  Reports her issue is xerostomia - causing speech and swallow issues.  No focal CN deficits noted and pt consume few bites/sips only including water and graham cracker.  With large sequential swallows with water - cough noted post-swallow - likely due to decreased ability to sustain airway closure.   Chin slightly down with small boluses tolerated well.  Slightly prolonged mastication but adequate oral clearance.  Pt articulated that she is "thinking about stopping treatment" stating "she has a right to decide her own care". Encouraged her to have conversation with her MD and family re: this issue.  Recommend diet as tolerated - SLP will sign off after note filed. Thanks for this consult.     Rolena Infante, MS [facility] SLP Acute [employer] [phone]

## 2023-10-25 DIAGNOSIS — K56609 Unspecified intestinal obstruction, unspecified as to partial versus complete obstruction: Secondary | ICD-10-CM | POA: Diagnosis not present

## 2023-10-25 LAB — COMPREHENSIVE METABOLIC PANEL
ALT: 65 U/L — ABNORMAL HIGH (ref 0–44)
AST: 47 U/L — ABNORMAL HIGH (ref 15–41)
Albumin: 2.1 g/dL — ABNORMAL LOW (ref 3.5–5.0)
Alkaline Phosphatase: 180 U/L — ABNORMAL HIGH (ref 38–126)
Anion gap: 5 (ref 5–15)
BUN: 19 mg/dL (ref 8–23)
CO2: 23 mmol/L (ref 22–32)
Calcium: 8.5 mg/dL — ABNORMAL LOW (ref 8.9–10.3)
Chloride: 103 mmol/L (ref 98–111)
Creatinine, Ser: 0.55 mg/dL (ref 0.44–1.00)
GFR, Estimated: >60 mL/min (ref >60)
Glucose, Bld: 115 mg/dL — ABNORMAL HIGH (ref 70–99)
Potassium: 3.8 mmol/L (ref 3.5–5.1)
Sodium: 131 mmol/L — ABNORMAL LOW (ref 135–145)
Total Bilirubin: 0.5 mg/dL (ref <1.2)
Total Protein: 7.1 g/dL (ref 6.5–8.1)

## 2023-10-25 LAB — MAGNESIUM: Magnesium: 1.8 mg/dL (ref 1.7–2.4)

## 2023-10-25 LAB — PHOSPHORUS: Phosphorus: 3.2 mg/dL (ref 2.5–4.6)

## 2023-10-25 LAB — TRIGLYCERIDES: Triglycerides: 342 mg/dL — ABNORMAL HIGH (ref <150)

## 2023-10-25 MED ORDER — TRACE MINERALS CU-MN-SE-ZN 300-55-60-3000 MCG/ML IV SOLN
INTRAVENOUS | Status: AC
  Filled 2023-10-25 (×2): qty 1968

## 2023-10-25 MED ORDER — PROSOURCE PLUS PO LIQD
30.0000 mL | Freq: Two times a day (BID) | ORAL | Status: DC
  Administered 2023-10-25 – 2023-10-27 (×3): 30 mL via ORAL
  Filled 2023-10-25 (×5): qty 30

## 2023-10-25 MED ORDER — GERHARDT'S BUTT CREAM
TOPICAL_CREAM | Freq: Two times a day (BID) | CUTANEOUS | Status: DC
  Administered 2023-10-26 – 2023-11-02 (×4): 1 via TOPICAL
  Filled 2023-10-25: qty 1

## 2023-10-25 MED ORDER — FAT EMUL FISH OIL/PLANT BASED 20% (SMOFLIPID)IV EMUL
250.0000 mL | INTRAVENOUS | Status: AC
Start: 2023-10-25 — End: 2023-10-26
  Administered 2023-10-25: 250 mL via INTRAVENOUS
  Filled 2023-10-25: qty 250

## 2023-10-25 NOTE — Plan of Care (Signed)
  Problem: Health Behavior/Discharge Planning: Goal: Ability to identify and utilize available resources and services will improve Outcome: Progressing Goal: Ability to manage health-related needs will improve Outcome: Progressing   Problem: Metabolic: Goal: Ability to maintain appropriate glucose levels will improve Outcome: Progressing   Problem: Nutritional: Goal: Maintenance of adequate nutrition will improve Outcome: Progressing Goal: Progress toward achieving an optimal weight will improve Outcome: Progressing

## 2023-10-25 NOTE — Progress Notes (Addendum)
Nutrition Follow-up  DOCUMENTATION CODES:   Non-severe (moderate) malnutrition in context of chronic illness  INTERVENTION:  - 72 hour calorie count to start tomorrow, 11/15 and run through the end of 11/17.   - Please document % intake of all foods, drinks, and nutrition supplements patient consumes on meal tickets and place in envelope on patient's door.  - If patient skips/refuses meals please document 0% for that meal.  - Regular diet.  - Magic cup TID with meals, each supplement provides 290 kcal and 9 grams of protein - Add ProSource Plus po BID, each supplement provides 100 kcal and 15 grams of protein.  - Would recommend continuing TPN at this time due to ongoing inadequate intake.              - TPN management per pharmacy.    - Daily weights while on TPN.     NUTRITION DIAGNOSIS:   Moderate Malnutrition related to chronic illness as evidenced by moderate fat depletion, severe muscle depletion. *ongoing  GOAL:   Patient will meet greater than or equal to 90% of their needs *progressing, on TPN and oral diet  MONITOR:   PO intake, Supplement acceptance, Diet advancement, Labs, Weight trends, I & O's  REASON FOR ASSESSMENT:   Consult New TPN/TNA  ASSESSMENT:   82 year old F with PMH of A-fib, RLS and anemia presented to ED on 10/17 with recurrent abdominal pain, nausea, emesis, diarrhea and intolerance of oral intake.  Patient was seen in ED on 10/10 for abdominal pain, decreased appetite and fever and discharged on Augmentin for possible colitis noted on CT abdomen and pelvis.  10/18: admitted 10/19: s/p flex sigmoidoscopy 10/21: s/p colectomy w/ colostomy 10/23: NPO 10/31: CLD -> NPO 11/1: TPN started 11/5: CLD 11/8: FLD 11/11: Soft diet 11/12: Calorie count started 11/13 Regular diet  Patient in bedside chair during visit.  Reporets her appetite remains poor. Still having some issues with ordering but feels it would be best if nursing can just help  her order. Discussed importance of ordering 3 meals a day.  Calorie count complete, results below: Day 1 (11/12): Breakfast: 0% Lunch: 0% Dinner: 0% Snakcs: 5 push up [place] ices = 350 kcals Supplements: 1 Boost Plus (per pt report) = 360 kcals, 14g protein *Nothing documented in calorie count envelope so all intake taken from patient report. Total intake: 710 kcal (37% of minimum estimated needs)  14 protein (15% of minimum estimated needs)  Day 2 (11/13):  Breakfast: 15% = 36 kcals, 2g protien Lunch: 0% Dinner: 30% = 142 kcals, 11g protein Supplements: None Total intake: 178 kcal (9% of minimum estimated needs)  13 protein (14% of minimum estimated needs)  Average: 444 kcals (23% of kcal needs) and 13g protein (15% of protein needs)  Discussed results with Hospitalist and Surgery. Given poor results would recommend continuing TPN at this time. Patient has fistula that needs healing. Plan to restart calorie count tomorrow and continue through the weekend per discussion with Surgery.    TPN per pharmacy: Clinimix 8/10, 1L x 5 days per week and 2L x 2 days per week with daily lipids ( ) to provide 103 g protein (100% of needs) and an average of 1353 kcal (71% of needs) per day    Admit weight: 124# Current weight: 137# I&O's: +4.3L  Medications reviewed and include: Colace, Miralax  Labs reviewed:  Na 131 Triglycerides 342 (as of 11/14)   Diet Order:   Diet Order  Diet regular Room service appropriate? Yes; Fluid consistency: Thin  Diet effective now                   EDUCATION NEEDS:  Education needs have been addressed  Skin:  Skin Assessment: Skin Integrity Issues: Skin Integrity Issues:: Stage II, Incisions Stage II: mid sacrum Incisions: 10/21 abdomen  Last BM:  11/9 - colostomy  Height:  Ht Readings from Last 1 Encounters:  10/04/23 5\' 5"  (1.651 m)   Weight:  Wt Readings from Last 1 Encounters:  10/25/23 62.2 kg    BMI:   Body mass index is 22.82 kg/m.  Estimated Nutritional Needs:  Kcal:  1900-2100 Protein:  90-105g Fluid:  2L/day    Shelle Iron RD, LDN For contact information, refer to [facility].

## 2023-10-25 NOTE — Progress Notes (Signed)
PHARMACY - TOTAL PARENTERAL NUTRITION CONSULT NOTE   Indication:  Prolonged ileus  Patient Measurements: Height: 5\' 5"  (165.1 cm) Weight: 62 kg (136 lb 11 oz) IBW/kg (Calculated) : 57 TPN AdjBW (KG): 62 Body mass index is 22.75 kg/m. Usual Weight: 64.5 on 10/04/2023  Assessment:  TPN per pharmacy consult for this 82 yo female admitted on 09/27/2023 with recurrent abdominal pain, nausea, emesis, diarrhea and intolerance of oral intake. Surgery completed exp lap with small bowel resection and Hartman's resection and colostomy on 10/01/2023.  Post-op ileus, 10/31 imaging showed anastomotic leak - drain placed by IR on 11/1 for abscess.  Glucose / Insulin: No hx DM. Goal CBG <180 -BG: 115. SSI discontinued Electrolytes: Na (131) remains low - stable. -All other lytes WNL, including CorrCa (10) -Bicarb, chloride WNL Renal: SCr <1, BUN WNL Hepatic: LFTs slightly elevated but stable, Alk Phos trending down -Albumin remains low -TG (342) elevated and increasing  Intake / Output; MIVF: no mIVF -UOP: 1350 mL. Drain output: 10 mL.  -LBM 11/14 scant stool in ostomy bag GI Imaging: 10/10 CT A/P: small bowel enteritis/colitis that was also involving the sigmoid colon. No bowel obstruction seen.  10/17 CT A/P: wall thickening sigmoid colon worrisome for neoplasm. Focal colitis not excluded. Colonic dilation compatible with obstruction.  Cystic mass at the tail of the pancreas.  10/18 MR - 9 mm cystic lesion within pancreatic body/tail junction, colonic obstruction 10/31 CT A/P - colostomy and Hartmann's pouch seen.  Appears to be extravasation of contrast suggesting perforation or leakage. 11/8 CT A/P: No persistent anastomotic leak or fistula. GI Surgeries / Procedures:  10/19 flexible sigmoidoscopy 10/21 colectomy with colostomy creation/Hartman procedure 11/1: CT guided visceral fluid drain by perc cath  Central access: PICC line placed 11/1 TPN start date: 11/1  Nutritional Goals: Due  to the Baxter IV fluid disruption, we will be switching all adult compounded parenteral nutrition to premade Clinimix products +/- fat emulsion infusion. Our goal will be to continue providing as close to 100% of our patient's nutritional needs. Due to the volatile availability of different Clinimix concentrations we may not be able to meet 100% of adult patient nutritional goals at this time.    TPN:  Clinimix 8/10, 1L x 5 days per week and 2L x 2 days per week with daily lipids ( ) to provide 103 g protein and an average of 1353 kcal per day D10 previously infusing in addition to Clinimix to provide additional kcal, however, discontinued 11/9 given hyponatremia  RD Assessment:  Estimated Needs Total Energy Estimated Needs: 1900-2100 Total Protein Estimated Needs: 90-105g Total Fluid Estimated Needs: 2L/day  Current Nutrition:  TPN  Diet: Regular. Calorie count ordered, but minimal PO intake Magic cup TID (each provides 290 kcal, 9 g protein) Boost BID (each provides 360 kcal, 14 g protein)  Plan:   At 1800: Continue TPN with Clinimix E 8/10 + SMOF lipids Clinimix 2L on Monday, Thursday (82 mL/hr).  1L all other days of the week (41 mL/hr) Electrolytes in TPN: Unable to adjust Na 35 mEq/L, K 30 mEq/L, Ca 4.5 mEq/L, Mg 5 mEq/L, and Phos 15 mmol/L. Add standard MVI and trace elements to TPN, no chromium due to shortage No CBG checks/SSI  Monitor TPN labs on Mon/Thurs and as needed Follow ability to tolerate advancing diet for ability to wean off of TPN.  Cindi Carbon, PharmD 10/25/23 8:18 AM

## 2023-10-25 NOTE — Consult Note (Signed)
WOC Nurse Consult Note: Reason for Consult:moisture associated skin damage to bilateral gluteal folds.   Wound type:Moisture  Pressure Injury POA:NA Measurement: 2 cm x 1.5 cm on each gluteal fold Wound JWJ:XBJY Drainage (amount, consistency, odor)none  Periwound:intact  Dressing procedure/placement/frequency:GErhardts butt paste twice daily and PRN soilage.  Encourage patient to offload pressure and keep skin clean and dry. No disposable briefs or underpads while in bed.  Will not follow at this time.  Please re-consult if needed.  Mike Gip MSN, RN, FNP-BC CWON Wound, Ostomy, Continence Nurse Outpatient [facility] [phone] Pager [phone]

## 2023-10-25 NOTE — Progress Notes (Signed)
PROGRESS NOTE    Elizabeth Odom  ZOX:[id number] DOB: [birth year] DOA: 09/27/2023 PCP: [name], FNP   Brief Narrative: 82 year old with past medical history significant for A-fib, RLS, anemia presented to the ED 10/17 with recurrent abdominal pain, nausea emesis, diarrhea intolerance of oral intake.  She was evaluated in the ED 10/10 for abdominal pain, decreased appetite, fever, diagnosed with possible colitis noted on CT abdomen and pelvis discharged on Augmentin.  She had recurrence of symptoms and presented back to the hospital.  CT initially showed proximal sigmoid colon wall thickening with upstream colonic dilation with air-fluid levels and large amount of stool compatible with obstruction.  9 mm cystic lesion in the tail of the pancreas and subcentimeter lucent lesion in L1 vertebral body.  GI consulted, patient refused colonoscopy but agreed to sigmoidoscopy showing stricture versus mass at the rectosigmoid region.  Scope could not be advanced past this.  Surgery was consulted and she underwent a small bowel resection and Hartman's resection and  colostomy on 10/01/2023.  She continued to have postoperative ileus managed by general surgery.  NG remains in place to suction.  Hospital course also complicated by acute PE and left lower extremity DVT she was started on heparin.  She subsequently developed acute blood loss anemia and subsequent pelvic hematoma near the small bowel anastomosis as well as notable hematuria.  Urology was consulted.  Signed off after hematuria resolution.  Given high risk for bleeding with notable PE DVT evaluated by IR and placed IVC filter 10/05/2023.  Patient required total of 4 units of packed red blood cell.  As her bowel function did not return she underwent a repeat CT abdomen and pelvis on 10/31st showing anastomotic leak into 7 x 6 cm complex fluid collection posteriorly in the pelvis.  IR was consulted and underwent CT-guided percutaneous drain placement  into pelvic abscess.  Fluid grew MRSA.  ID consulted and once transitioned to daptomycin and Zosyn.  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  ID recommended no further need of antibiotics.  Awaiting his SNF currently   Assessment & Plan:   Principal Problem:   Large bowel obstruction (HCC) Active Problems:   Acute colitis   Left leg DVT (HCC)   ABLA (acute blood loss anemia)   Palliative care encounter   Counseling and coordination of care   Need for emotional support   Goals of care, counseling/discussion   High risk medication use   Pain   Medication management   Pressure injury of skin   Malnutrition of moderate degree  1-Large bowel obstruction Postop.  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  -Patient presented with abdominal pain.  CT abdomen 10/17 proximal sigmoid colon thickening -GI consulted underwent sigmoidoscopy 10/19 which showed tight stricture found to have a mass, biopsy was negative for malignancy as suggesting inflammation. -Status post exploratory laparotomy Hartman's resection 10/21st with a small bowel resection and ostomy by Dr. [name]. -CT abdomen and pelvis 10/31st show a small leak at her anastomosis going into 6 x 7 cm fluid collection. -Underwent drainage catheter placement of pelvic abscess by IR on 11/1 -Fluid grew MRSA, ID consulted.  She was treated with daptomycin and Zosyn. -Repeated CT abdomen and pelvis 11/7 show percutaneous pigtail drain catheter, no persistent anastomotic leak or fistula. -11/11 pelvic drain injection study showed fistulous communication with colon/staple line anastomosis.  Drain to remain in place. -Per ID  no further indication to continue antibiotics.  Antibiotics discontinued -Continue TPN, soft diet, surgery following She is eating more, plan  to continue with calori count over weekend.   Hyponatremia D10 was discontinued.  Continue to monitor sodium level   Acute blood loss anemia status post perianastomotic hematoma with hematuria Status post 4 units of packed red blood cell.  Status post IVC filter -Evaluated by urology no indication for cystoscopy or intervention Globin has remained stable   Acute PE without right heart strain with acute left DVT Unable to tolerate anticoagulation and status post IVC filter placement 10/25th Correction, no evidece of right heart strain on CT scan CT chest. ECHO normal RV function.   Thyroid nodule in the inferior aspect of the isthmus Outpatient follow-up  Paroxysmal A-fib Controlled.  Not on anticoagulation  Hypokalemia hypophosphatemia hypomagnesemia Replace as needed  Urinary retention: continue with foley catheter.   Hypotension; resolved  Leukocytosis Resolved.   Pressure injury documentation Local care.   Moderate protein caloric malnutrition On TPN    See wound care documentation below  Pressure Injury 10/08/23 Buttocks Right;Mid Stage 2 -  Partial thickness loss of dermis presenting as a shallow open injury with a red, pink wound bed without slough. (Active)  10/08/23 0800  Location: Buttocks  Location Orientation: Right;Mid  Staging: Stage 2 -  Partial thickness loss of dermis presenting as a shallow open injury with a red, pink wound bed without slough.  Wound Description (Comments):   Present on Admission:   Dressing Type Foam - Lift dressing to assess site every shift 10/24/23 2200     Nutrition Problem: Moderate Malnutrition Etiology: chronic illness    Signs/Symptoms: moderate fat depletion, severe muscle depletion    Interventions: Refer to RD note for recommendations, TPN  Estimated body mass index is 22.75 kg/m as calculated from the following:   Height as of this encounter: 5\' 5"  (1.651 m).   Weight as of this encounter: 62  kg.   DVT prophylaxis: SCD Code Status: DNR Family Communication: care discussed with patient.  Disposition Plan:  Status is: Inpatient Remains inpatient appropriate because: management. Of SBP    Consultants:  Surgery Urology IR ID Procedures:  10/21 s/p ex lap hartman's resection , small bowel resection and ostomy.  IVC filter placement  Antimicrobials:   Subjective She relates eating more. Ate fish today   Objective: Vitals:   10/24/23 0628 10/24/23 1244 10/24/23 2222 10/25/23 0612  BP: (!) 108/57 116/78 (!) 132/55 106/62  Pulse: 65 67  68  Resp: 17 16 17 17   Temp: 98.2 F (36.8 C) 97.9 F (36.6 C) 99.6 F (37.6 C) 99.2 F (37.3 C)  TempSrc:   Oral Oral  SpO2: 99% 99% 97% 98%  Weight:      Height:        Intake/Output Summary (Last 24 hours) at 10/25/2023 0742 Last data filed at 10/25/2023 0602 Gross per 24 hour  Intake 1520.1 ml  Output 1970 ml  Net -449.9 ml   Filed Weights   10/20/23 0500 10/21/23 0500 10/23/23 0500  Weight: 61 kg 61 kg 62 kg    Examination:  General exam: NAD Respiratory system: CTA Cardiovascular system: S 1, S 2  RRR Gastrointestinal system: BS present, soft, nt, ostomy in place, drain placed. Wound cover Central nervous system: Alert Extremities: no edema  Data Reviewed: I have personally reviewed following labs and imaging studies  CBC: Recent Labs  Lab 10/19/23  4008 10/20/23 0549 10/23/23 0312 10/24/23 0334  WBC 7.2 8.6 7.7 6.9  HGB 10.3* 10.4* 10.1* 10.3*  HCT 32.2* 31.9* 31.5* 32.1*  MCV 95.8 95.8 96.3 95.8  PLT 318 312 270 265   Basic Metabolic Panel: Recent Labs  Lab 10/20/23 0549 10/21/23 0400 10/22/23 0354 10/23/23 1846 10/24/23 0334 10/25/23 0350  NA 125* 131* 128*  --  131* 131*  K 3.6 3.8 3.7 4.1 3.9 3.8  CL 98 103 100  --  104 103  CO2 20* 23 22  --  21* 23  GLUCOSE 137* 126* 121*  --  119* 115*  BUN 17 15 18   --  21 19  CREATININE 0.61 0.49 0.65  --  0.44 0.55  CALCIUM 7.8* 8.2* 8.1*   --  8.3* 8.5*  MG 1.8 2.1 1.9 1.9 1.9 1.8  PHOS 2.3* 2.5 2.9  --  2.8 3.2   GFR: Estimated Creatinine Clearance: 47.9 mL/min (by C-G formula based on SCr of 0.55 mg/dL). Liver Function Tests: Recent Labs  Lab 10/22/23 0354 10/25/23 0350  AST 48* 47*  ALT 59* 65*  ALKPHOS 205* 180*  BILITOT 0.6 0.5  PROT 6.2* 7.1  ALBUMIN 2.1* 2.1*   No results for input(s): "LIPASE", "AMYLASE" in the last 168 hours. No results for input(s): "AMMONIA" in the last 168 hours. Coagulation Profile: No results for input(s): "INR", "PROTIME" in the last 168 hours. Cardiac Enzymes: No results for input(s): "CKTOTAL", "CKMB", "CKMBINDEX", "TROPONINI" in the last 168 hours. BNP (last 3 results) No results for input(s): "PROBNP" in the last 8760 hours. HbA1C: No results for input(s): "HGBA1C" in the last 72 hours. CBG: Recent Labs  Lab 10/22/23 0829 10/22/23 1707 10/22/23 2352 10/23/23 0729 10/23/23 1632  GLUCAP 106* 112* 130* 132* 122*   Lipid Profile: Recent Labs    10/25/23 0350  TRIG 342*   Thyroid Function Tests: No results for input(s): "TSH", "T4TOTAL", "FREET4", "T3FREE", "THYROIDAB" in the last 72 hours. Anemia Panel: No results for input(s): "VITAMINB12", "FOLATE", "FERRITIN", "TIBC", "IRON", "RETICCTPCT" in the last 72 hours. Sepsis Labs: No results for input(s): "PROCALCITON", "LATICACIDVEN" in the last 168 hours.  No results found for this or any previous visit (from the past 240 hour(s)).       Radiology Studies: No results found.      Scheduled Meds:  bisacodyl  10 mg Rectal Daily   Chlorhexidine Gluconate Cloth  6 each Topical Daily   docusate  100 mg Oral BID   ketotifen  2 drop Both Eyes BID   lactose free nutrition  237 mL Oral BID BM   polyethylene glycol  17 g Oral Daily   sodium chloride flush  10-40 mL Intracatheter Q12H   sodium hypochlorite   Irrigation BID   Continuous Infusions:  TPN (CLINIMIX-E) Adult 41 mL/hr at 10/24/23 1740     LOS: 28  days    Time spent: 35 minutes    Severn Goddard A Nolita Kutter, MD [facility]   If 7PM-7AM, please contact night-coverage [email or web address]  10/25/2023, 7:42 AM

## 2023-10-25 NOTE — Progress Notes (Signed)
Physical Therapy Treatment Patient Details Name: Elizabeth Odom MRN: [id number] DOB: 05-Jan-1941 Today's Date: 10/25/2023   History of Present Illness Patient is a 82 year old female who presented on 10/17 with acute colitis. on10/21, patient underwent ex laparotomy with hartman's resection and colostomy and small bowel resection. PMH: a fib, RLS, syncope, cervical CA, cervical spine fusions, anemia    PT Comments  The patient reporting significant  pain in  buttock. Patient assisted  to stand and place additional pillow in the recliner. Waiting on delivery of  air mattress before returning to bed, per RN. Continue PT for mobility.     If plan is discharge home, recommend the following: A lot of help with walking and/or transfers;A lot of help with bathing/dressing/bathroom;Assistance with cooking/housework;Help with stairs or ramp for entrance;Two people to help with walking and/or transfers   Can travel by private vehicle     No  Equipment Recommendations  None recommended by PT    Recommendations for Other Services       Precautions / Restrictions Precautions Precaution Comments: R post. drain, colostomy, sacral wound and drain, abd  surgical wound Restrictions Weight Bearing Restrictions: No     Mobility  Bed Mobility   Bed Mobility: Rolling Rolling: Min assist   Supine to sit: Mod assist     General bed mobility comments: in recliner    Transfers Overall transfer level: Needs assistance Equipment used: Rolling walker (2 wheels) Transfers: Sit to/from Stand Sit to Stand: From elevated surface, +2 physical assistance, +2 safety/equipment, Mod assist   Step pivot transfers: Max assist, +2 physical assistance, +2 safety/equipment      General transfer comment: stood from reclioner x 2 to reposition, placed horseshoe shaped pillow/ pad under to decrease buttock pressure. Patient able to assist  to power up to stand    Ambulation/Gait                    Stairs             Wheelchair Mobility     Tilt Bed    Modified Rankin (Stroke Patients Only)       Balance Overall balance assessment: Needs assistance Sitting-balance support: Feet supported, Bilateral upper extremity supported Sitting balance-Leahy Scale: Fair Sitting balance - Comments: posterior leaning.   Standing balance support: Bilateral upper extremity supported, Reliant on assistive device for balance, During functional activity Standing balance-Leahy Scale: Poor Standing balance comment: reliant on UE and external assist                            Cognition Arousal: Alert Behavior During Therapy: WFL for tasks assessed/performed Overall Cognitive Status: Within Functional Limits for tasks assessed                                 General Comments: reports that she will do her best.        Exercises      General Comments        Pertinent Vitals/Pain Pain Assessment Faces Pain Scale: Hurts worst Pain Location: buttocks Pain Descriptors / Indicators: Burning, Crying, Discomfort, Moaning Pain Intervention(s): Patient requesting pain meds-RN notified    Home Living                          Prior Function  PT Goals (current goals can now be found in the care plan section) Progress towards PT goals: Progressing toward goals    Frequency    Min 1X/week      PT Plan      Co-evaluation              AM-PAC PT "6 Clicks" Mobility   Outcome Measure  Help needed turning from your back to your side while in a flat bed without using bedrails?: A Little Help needed moving from lying on your back to sitting on the side of a flat bed without using bedrails?: A Lot Help needed moving to and from a bed to a chair (including a wheelchair)?: A Lot Help needed standing up from a chair using your arms (e.g., wheelchair or bedside chair)?: A Lot Help needed to walk in hospital room?:  Total Help needed climbing 3-5 steps with a railing? : Total 6 Click Score: 11    End of Session Equipment Utilized During Treatment: Gait belt Activity Tolerance: Patient limited by pain Patient left: in chair;with call bell/phone within reach;with nursing/sitter in room Nurse Communication: Mobility status PT Visit Diagnosis: Difficulty in walking, not elsewhere classified (R26.2);Muscle weakness (generalized) (M62.81);Unsteadiness on feet (R26.81);Pain     Time: 1145-1156 PT Time Calculation (min) (ACUTE ONLY): 11 min  Charges:    $Therapeutic Activity: 8-22 mins  PT General Charges $$ ACUTE PT VISIT: 1 Visit                      Rada Hay 10/25/2023, 3:58 PM

## 2023-10-25 NOTE — Progress Notes (Addendum)
Physical Therapy Treatment Patient Details Name: Elizabeth Odom MRN: [id number] DOB: 07-28-1941 Today's Date: 10/25/2023   History of Present Illness Patient is a 82 year old female who presented on 10/17 with acute colitis. on10/21, patient underwent ex laparotomy with hartman's resection and colostomy and small bowel resection. PMH: a fib, RLS, syncope, cervical CA, cervical spine fusions, anemia    PT Comments  The patient is eager to mobilize  to recliner.  Patient reporting significant pain in the  buttocks with mobility and sitting. Patient required max of 2 persons to stand and  step to recliner  knees flexed , increased support required as patient stepped to recliner, legs beginning to buckle. Continue PT for mobility.    If plan is discharge home, recommend the following: A lot of help with walking and/or transfers;A lot of help with bathing/dressing/bathroom;Assistance with cooking/housework;Help with stairs or ramp for entrance;Two people to help with walking and/or transfers   Can travel by private vehicle     No  Equipment Recommendations  None recommended by PT    Recommendations for Other Services       Precautions / Restrictions Precautions Precaution Comments: R post. drain, colostomy, sacral wound, abd  surgical wound Restrictions Weight Bearing Restrictions: No     Mobility  Bed Mobility   Bed Mobility: Rolling Rolling: Min assist   Supine to sit: Mod assist     General bed mobility comments: patient able to move legs to bed edge, roll and push up to sitting  with mod support    Transfers Overall transfer level: Needs assistance Equipment used: Rolling walker (2 wheels) Transfers: Sit to/from Stand, Bed to chair/wheelchair/BSC Sit to Stand: From elevated surface, +2 physical assistance, +2 safety/equipment, Mod assist   Step pivot transfers: Max assist, +2 physical assistance, +2 safety/equipment       General transfer comment: patient's knees  are flexed, able to step to recliner, required max support as legs were beginning to buckle near the recliner, assisted to sit safely./    Ambulation/Gait                   Stairs             Wheelchair Mobility     Tilt Bed    Modified Rankin (Stroke Patients Only)       Balance Overall balance assessment: Needs assistance Sitting-balance support: Feet supported, Bilateral upper extremity supported Sitting balance-Leahy Scale: Fair Sitting balance - Comments: posterior leaning.   Standing balance support: Bilateral upper extremity supported, Reliant on assistive device for balance, During functional activity Standing balance-Leahy Scale: Poor                              Cognition Arousal: Alert Behavior During Therapy: WFL for tasks assessed/performed Overall Cognitive Status: Within Functional Limits for tasks assessed                                 General Comments: reports that she will do her best.        Exercises      General Comments        Pertinent Vitals/Pain Pain Assessment Faces Pain Scale: Hurts whole lot Pain Location: abdomen sacraum and drain in left glute Pain Descriptors / Indicators: Grimacing, Burning, Discomfort, Heaviness, Crying, Moaning Pain Intervention(s): Monitored during session, Repositioned, Relaxation, Limited activity within patient's  tolerance    Home Living                          Prior Function            PT Goals (current goals can now be found in the care plan section) Progress towards PT goals: Progressing toward goals    Frequency    Min 1X/week      PT Plan      Co-evaluation              AM-PAC PT "6 Clicks" Mobility   Outcome Measure  Help needed turning from your back to your side while in a flat bed without using bedrails?: A Little Help needed moving from lying on your back to sitting on the side of a flat bed without using bedrails?: A  Lot Help needed moving to and from a bed to a chair (including a wheelchair)?: A Lot Help needed standing up from a chair using your arms (e.g., wheelchair or bedside chair)?: Total Help needed to walk in hospital room?: Total Help needed climbing 3-5 steps with a railing? : Total 6 Click Score: 10    End of Session Equipment Utilized During Treatment: Gait belt Activity Tolerance: Patient tolerated treatment well;Patient limited by fatigue Patient left: in chair;with call bell/phone within reach Nurse Communication: Mobility status PT Visit Diagnosis: Difficulty in walking, not elsewhere classified (R26.2);Muscle weakness (generalized) (M62.81)     Time: 1610-9604 PT Time Calculation (min) (ACUTE ONLY): 24 min  Charges:    Therapeutic activity 23-37 mins PT General Charges $$ ACUTE PT VISIT: 1 Visit                     Blanchard Kelch PT Acute Rehabilitation Services Office [phone] Weekend pager-[phone]    Rada Hay 10/25/2023, 2:36 PM

## 2023-10-25 NOTE — Progress Notes (Signed)
Progress Note  24 Days Post-Op  Subjective: Pt sitting up in chair this AM. Continuing to work on PO intake although breakfast that she ordered was not delivered this AM. She is going to try magic cup this AM and ordered some pot roast for this afternoon. She reports mixed feelings today regarding wanting to continue aggressive care measures but we discussed that overall she is very stable at this time. We also discussed that biggest factor in recovery at this time is her nutrition. She understands this and will continue to let us know how she is feeling about this.   Objective: Vital signs in last 24 hours: Temp:  [97.9 F (36.6 C)-99.6 F (37.6 C)] 97.9 F (36.6 C) (11/14 1236) Pulse Rate:  [67-74] 74 (11/14 1236) Resp:  [14-17] 14 (11/14 1236) BP: (106-132)/(55-78) 115/66 (11/14 1236) SpO2:  [97 %-99 %] 97 % (11/14 1236) Weight:  [62.2 kg] 62.2 kg (11/14 0713) Last BM Date : 10/25/23  Intake/Output from previous day: 11/13 0701 - 11/14 0700 In: 1520.1 [P.O.:420; I.V.:1100.1] Out: 1970 [Urine:1350; Drains:10; Stool:610] Intake/Output this shift: Total I/O In: 484.4 [P.O.:240; I.V.:244.4] Out: -   PE: General: pleasant, WD, thin female, NAD Lungs: Respiratory effort nonlabored Abd: midline dressing clean, drain with scant clear brown fluid, ND, stoma viable with stool output Psych: A&Ox3 with an appropriate affect.    Lab Results:  Recent Labs    10/23/23 0312 10/24/23 0334  WBC 7.7 6.9  HGB 10.1* 10.3*  HCT 31.5* 32.1*  PLT 270 265   BMET Recent Labs    10/24/23 0334 10/25/23 0350  NA 131* 131*  K 3.9 3.8  CL 104 103  CO2 21* 23  GLUCOSE 119* 115*  BUN 21 19  CREATININE 0.44 0.55  CALCIUM 8.3* 8.5*   PT/INR No results for input(s): "LABPROT", "INR" in the last 72 hours. CMP     Component Value Date/Time   NA 131 (L) 10/25/2023 0350   K 3.8 10/25/2023 0350   CL 103 10/25/2023 0350   CO2 23 10/25/2023 0350   GLUCOSE 115 (H) 10/25/2023 0350    BUN 19 10/25/2023 0350   CREATININE 0.55 10/25/2023 0350   CALCIUM 8.5 (L) 10/25/2023 0350   PROT 7.1 10/25/2023 0350   ALBUMIN 2.1 (L) 10/25/2023 0350   AST 47 (H) 10/25/2023 0350   ALT 65 (H) 10/25/2023 0350   ALKPHOS 180 (H) 10/25/2023 0350   BILITOT 0.5 10/25/2023 0350   GFRNONAA >60 10/25/2023 0350   GFRAA >60 04/10/2016 1352   Lipase     Component Value Date/Time   LIPASE 41 09/27/2023 2007       Studies/Results: No results found.  Anti-infectives: Anti-infectives (From admission, onward)    Start     Dose/Rate Route Frequency Ordered Stop   10/15/23 1800  vancomycin (VANCOCIN) IVPB 1000 mg/200 mL premix  Status:  Discontinued        1,000 mg 200 mL/hr over 60 Minutes Intravenous Every 24 hours 10/14/23 1740 10/15/23 1045   10/15/23 1430  piperacillin-tazobactam (ZOSYN) IVPB 3.375 g        3.375 g 100 mL/hr over 30 Minutes Intravenous  Once 10/15/23 1343 10/15/23 1503   10/15/23 1400  DAPTOmycin (CUBICIN) IVPB 500 mg/78mL premix  Status:  Discontinued        8 mg/kg  64.1 kg 100 mL/hr over 30 Minutes Intravenous Daily 10/15/23 1045 10/22/23 1321   10/14/23 1800  vancomycin (VANCOREADY) IVPB 1250 mg/250 mL  1,250 mg 166.7 mL/hr over 90 Minutes Intravenous  Once 10/14/23 1740 10/14/23 2255   10/14/23 1400  piperacillin-tazobactam (ZOSYN) IVPB 3.375 g  Status:  Discontinued        3.375 g 12.5 mL/hr over 240 Minutes Intravenous Every 8 hours 10/14/23 0845 10/22/23 1321   10/14/23 0900  piperacillin-tazobactam (ZOSYN) IVPB 3.375 g        3.375 g 100 mL/hr over 30 Minutes Intravenous NOW 10/14/23 0844 10/14/23 1002   10/05/23 1200  piperacillin-tazobactam (ZOSYN) IVPB 3.375 g        3.375 g 12.5 mL/hr over 240 Minutes Intravenous Every 8 hours 10/05/23 0721 10/09/23 1716   10/04/23 2000  piperacillin-tazobactam (ZOSYN) IVPB 3.375 g  Status:  Discontinued        3.375 g 12.5 mL/hr over 240 Minutes Intravenous Every 8 hours 10/04/23 1827 10/05/23 0721    09/27/23 2315  piperacillin-tazobactam (ZOSYN) IVPB 3.375 g  Status:  Discontinued        3.375 g 12.5 mL/hr over 240 Minutes Intravenous Every 8 hours 09/27/23 2305 10/02/23 1106        Assessment/Plan  POD24 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 [name] - CT 10/24 with hematoma in the pelvis adjacent to small bowel anastomosis and ileus vs obstruction  - IR 11/1: drain placement for small bowel anastomotic leak/ abscess drainage - CT 11/8 with resolution in previous right hemipelvic collection and no leakage of enteric contrast - drain injection study 11/11 confirms fistulous connection - drain left in place. Stop flushing drain and placed to gravity bag yesterday - reg diet and TPN, supplements  - restart calorie count tomorrow and extend through the weekend  - getting in enough nutritionally will be key to trying to heal fistula  - ok to stop stomal suppositories for now, continue miralax - continue daily dressing changes     FEN: PICC/TPN; reg diet  VTE: IVC Filter  ID: no current abx   - per TRH -  ABL anemia secondary to post-op hematomas and hematuria  Acute PE and LLE DVT Pancreatic tail cyst Thyroid nodule  Chronic HFpEF Paroxysmal A. Fib Pre-diabetes RLS   LOS: 28 days    Juliet Rude, [facility] 10/25/2023, 12:43 PM Please see Amion for pager number during day hours 7:00am-4:30pm

## 2023-10-26 ENCOUNTER — Inpatient Hospital Stay (HOSPITAL_COMMUNITY): Payer: PPO

## 2023-10-26 DIAGNOSIS — K56609 Unspecified intestinal obstruction, unspecified as to partial versus complete obstruction: Secondary | ICD-10-CM | POA: Diagnosis not present

## 2023-10-26 LAB — TRIGLYCERIDES: Triglycerides: 156 mg/dL — ABNORMAL HIGH (ref <150)

## 2023-10-26 MED ORDER — TRAMADOL HCL 50 MG PO TABS
50.0000 mg | ORAL_TABLET | Freq: Four times a day (QID) | ORAL | Status: DC | PRN
  Administered 2023-10-26 – 2023-11-07 (×18): 50 mg via ORAL
  Filled 2023-10-26 (×18): qty 1

## 2023-10-26 MED ORDER — BISACODYL 10 MG RE SUPP
10.0000 mg | Freq: Once | RECTAL | Status: AC
  Administered 2023-10-26: 10 mg via RECTAL
  Filled 2023-10-26: qty 1

## 2023-10-26 MED ORDER — ACETAMINOPHEN 325 MG PO TABS
650.0000 mg | ORAL_TABLET | Freq: Four times a day (QID) | ORAL | Status: DC | PRN
  Administered 2023-10-26 – 2023-11-06 (×2): 650 mg via ORAL
  Filled 2023-10-26 (×3): qty 2

## 2023-10-26 MED ORDER — FAT EMUL FISH OIL/PLANT BASED 20% (SMOFLIPID)IV EMUL
250.0000 mL | INTRAVENOUS | Status: AC
  Administered 2023-10-26: 250 mL via INTRAVENOUS
  Filled 2023-10-26: qty 250

## 2023-10-26 MED ORDER — ADULT MULTIVITAMIN W/MINERALS CH
1.0000 | ORAL_TABLET | Freq: Every day | ORAL | Status: DC
  Administered 2023-10-28 – 2023-11-09 (×9): 1 via ORAL
  Filled 2023-10-26 (×11): qty 1

## 2023-10-26 MED ORDER — CLINIMIX E/DEXTROSE (8/10) 8 % IV SOLN
INTRAVENOUS | Status: AC
  Filled 2023-10-26 (×2): qty 984

## 2023-10-26 MED ORDER — SIMETHICONE 80 MG PO CHEW
80.0000 mg | CHEWABLE_TABLET | Freq: Four times a day (QID) | ORAL | Status: DC | PRN
  Administered 2023-10-26: 80 mg via ORAL
  Filled 2023-10-26: qty 1

## 2023-10-26 MED ORDER — DOCUSATE SODIUM 100 MG PO CAPS
100.0000 mg | ORAL_CAPSULE | Freq: Two times a day (BID) | ORAL | Status: DC
  Administered 2023-10-26 – 2023-11-09 (×23): 100 mg via ORAL
  Filled 2023-10-26 (×29): qty 1

## 2023-10-26 NOTE — Progress Notes (Addendum)
PHARMACY - TOTAL PARENTERAL NUTRITION CONSULT NOTE   Indication:  Prolonged ileus  Patient Measurements: Height: 5\' 5"  (165.1 cm) Weight: 62.2 kg (137 lb 2 oz) IBW/kg (Calculated) : 57 TPN AdjBW (KG): 62 Body mass index is 22.82 kg/m. Usual Weight: 64.5 on 10/04/2023  Assessment:  TPN per pharmacy consult for this 82 yo female admitted on 09/27/2023 with recurrent abdominal pain, nausea, emesis, diarrhea and intolerance of oral intake. Surgery completed exp lap with small bowel resection and Hartman's resection and colostomy on 10/01/2023.  Post-op ileus, 10/31 imaging showed anastomotic leak - drain placed by IR on 11/1 for abscess.  Glucose / Insulin: No hx DM. Goal CBG <180 -SSI discontinued Electrolytes: Na (131) remains low - stable. -All other lytes WNL, including CorrCa (10) -Bicarb, chloride WNL Renal: SCr <1, BUN WNL Hepatic: LFTs slightly elevated but stable, Alk Phos trending down -Albumin remains low -TG (156) decreased, now only slightly elevated Intake / Output; MIVF: no mIVF -UOP: 1050 mL. Drain output: 5 mL, ostomy: 400 mL GI Imaging: 10/10 CT A/P: small bowel enteritis/colitis that was also involving the sigmoid colon. No bowel obstruction seen.  10/17 CT A/P: wall thickening sigmoid colon worrisome for neoplasm. Focal colitis not excluded. Colonic dilation compatible with obstruction.  Cystic mass at the tail of the pancreas.  10/18 MR - 9 mm cystic lesion within pancreatic body/tail junction, colonic obstruction 10/31 CT A/P - colostomy and Hartmann's pouch seen.  Appears to be extravasation of contrast suggesting perforation or leakage. 11/8 CT A/P: No persistent anastomotic leak or fistula. GI Surgeries / Procedures:  10/19 flexible sigmoidoscopy 10/21 colectomy with colostomy creation/Hartman procedure 11/1: CT guided visceral fluid drain by perc cath  Central access: PICC line placed 11/1 TPN start date: 11/1  Nutritional Goals: Due to the Baxter IV  fluid disruption, we will be switching all adult compounded parenteral nutrition to premade Clinimix products +/- fat emulsion infusion. Our goal will be to continue providing as close to 100% of our patient's nutritional needs. Due to the volatile availability of different Clinimix concentrations we may not be able to meet 100% of adult patient nutritional goals at this time.    TPN: Clinimix E 8/10, 1L x 5 days per week and 2L x 2 days per week with daily lipids ( ) to provide 103 g protein and an average of 1353 kcal per day D10 previously infusing in addition to Clinimix to provide additional kcal, however, discontinued 11/9 given hyponatremia  RD Assessment:  Estimated Needs Total Energy Estimated Needs: 1900-2100 Total Protein Estimated Needs: 90-105g Total Fluid Estimated Needs: 2L/day  Current Nutrition:  TPN  Diet: Regular. Calorie count ongoing.  Magic cup TID (each provides 290 kcal, 9 g protein) Prosource Plus PO BID (each provides 100 kcal, 15 g protein)  Plan:  72 hour calorie count through the weekend.   At 1800: Continue TPN with Clinimix E 8/10 + SMOF lipids Clinimix 2L on Monday, Thursday (82 mL/hr).  1L all other days of the week (41 mL/hr) Electrolytes in TPN: Unable to adjust Na 35 mEq/L, K 30 mEq/L, Ca 4.5 mEq/L, Mg 5 mEq/L, and Phos 15 mmol/L. MVI changed to PO No CBG checks/SSI  Monitor TPN labs on Mon/Thurs and as needed Follow ability to tolerate advancing diet for ability to wean off of TPN.  Cindi Carbon, PharmD 10/26/23 10:32 AM

## 2023-10-26 NOTE — Progress Notes (Signed)
Physical Therapy Treatment Patient Details Name: Elizabeth Odom MRN: [id number] DOB: [birth year] Today's Date: 10/26/2023   History of Present Illness Patient is a 82 year old female who presented on 10/17 with acute colitis. on10/21, patient underwent ex laparotomy with hartman's resection and colostomy and small bowel resection. PMH: a fib, RLS, syncope, cervical CA, cervical spine fusions, anemia    PT Comments  LOS 28 days General Comments: AxO x 3 overwhelming "tired" and "fatigue" but willing to try.  Profoundly weak. Assisted OOB to recliner required increased assist.  General bed mobility comments: required increased assist this session due to MAX c/o fatigue and great difficulty tolerating scooting to EOB due to sacral pain/wound. General transfer comment: stood from reclioner x 2 to reposition, place horseshoe  pad under to decrease buttock pressure. patient able to assist  to power up.  Pt was too weak to amb.  Positioned in recliner with multiple pillows as well as her sacral cut out cushion. Pt will need ST Rehab at SNF to address mobility and functional decline prior to safely returning home.    If plan is discharge home, recommend the following: A lot of help with walking and/or transfers;A lot of help with bathing/dressing/bathroom;Assistance with cooking/housework;Help with stairs or ramp for entrance;Two people to help with walking and/or transfers   Can travel by private vehicle     No  Equipment Recommendations  None recommended by PT    Recommendations for Other Services       Precautions / Restrictions Precautions Precautions: Fall Precaution Comments: R post. drain, colostomy, sacral wound and drain, abd  surgical wound Restrictions Weight Bearing Restrictions: No (Simultaneous filing. User may not have seen previous data.)     Mobility  Bed Mobility Overal bed mobility: Needs Assistance Bed Mobility: Supine to Sit     Supine to sit: Max assist, Total  assist, +2 for physical assistance, +2 for safety/equipment     General bed mobility comments: required increased assist this session due to MAX c/o fatigue and great difficulty tolerating scooting to EOB due to sacral pain/wound.    Transfers Overall transfer level: Needs assistance Equipment used: 2 person hand held assist   Sit to Stand: From elevated surface, +2 physical assistance, +2 safety/equipment, Max assist           General transfer comment: stood from reclioner x 2 to reposition, place horseshoe  pad under to decrease buttock pressure. patient able to assist  to power up    Ambulation/Gait               General Gait Details: transfers only this session due to prfound weakness   Stairs             Wheelchair Mobility     Tilt Bed    Modified Rankin (Stroke Patients Only)       Balance                                            Cognition Arousal: Alert Behavior During Therapy: WFL for tasks assessed/performed Overall Cognitive Status: Within Functional Limits for tasks assessed                                 General Comments: AxO x 3 overwhelming "tired" and "fatigue" but willing to try.  Profoundly weak.        Exercises      General Comments        Pertinent Vitals/Pain Pain Assessment Pain Assessment: Faces Faces Pain Scale: Hurts even more Pain Location: sacrum Pain Descriptors / Indicators: Constant Pain Intervention(s): Premedicated before session, Repositioned    Home Living                          Prior Function            PT Goals (current goals can now be found in the care plan section) Progress towards PT goals: Progressing toward goals    Frequency    Min 1X/week      PT Plan      Co-evaluation              AM-PAC PT "6 Clicks" Mobility   Outcome Measure  Help needed turning from your back to your side while in a flat bed without using  bedrails?: A Lot Help needed moving from lying on your back to sitting on the side of a flat bed without using bedrails?: A Lot Help needed moving to and from a bed to a chair (including a wheelchair)?: A Lot Help needed standing up from a chair using your arms (e.g., wheelchair or bedside chair)?: A Lot Help needed to walk in hospital room?: Total Help needed climbing 3-5 steps with a railing? : Total 6 Click Score: 10    End of Session Equipment Utilized During Treatment: Gait belt Activity Tolerance: Patient limited by pain Patient left: in chair;with call bell/phone within reach;with nursing/sitter in room Nurse Communication: Mobility status PT Visit Diagnosis: Difficulty in walking, not elsewhere classified (R26.2);Muscle weakness (generalized) (M62.81);Unsteadiness on feet (R26.81);Pain     Time: 4782-9562 PT Time Calculation (min) (ACUTE ONLY): 10 min  Charges:    $Therapeutic Activity: 8-22 mins PT General Charges $$ ACUTE PT VISIT: 1 Visit                     Felecia Shelling  PTA Acute  Rehabilitation Services Office M-F          [phone]

## 2023-10-26 NOTE — Plan of Care (Signed)

## 2023-10-26 NOTE — Plan of Care (Signed)
  Problem: Safety: Goal: Ability to remain free from injury will improve Outcome: Progressing   Problem: Pain Managment: Goal: General experience of comfort will improve Outcome: Progressing   Problem: Coping: Goal: Level of anxiety will decrease Outcome: Progressing   Problem: Nutritional: Goal: Progress toward achieving an optimal weight will improve Outcome: Progressing

## 2023-10-26 NOTE — Progress Notes (Signed)
PROGRESS NOTE    Elizabeth Odom  ZOX:[id number] DOB: [birth year] DOA: 09/27/2023 PCP: [name], FNP   Brief Narrative: 82 year old with past medical history significant for A-fib, RLS, anemia presented to the ED 10/17 with recurrent abdominal pain, nausea emesis, diarrhea intolerance of oral intake.  She was evaluated in the ED 10/10 for abdominal pain, decreased appetite, fever, diagnosed with possible colitis noted on CT abdomen and pelvis discharged on Augmentin.  She had recurrence of symptoms and presented back to the hospital.  CT initially showed proximal sigmoid colon wall thickening with upstream colonic dilation with air-fluid levels and large amount of stool compatible with obstruction.  9 mm cystic lesion in the tail of the pancreas and subcentimeter lucent lesion in L1 vertebral body.  GI consulted, patient refused colonoscopy but agreed to sigmoidoscopy showing stricture versus mass at the rectosigmoid region.  Scope could not be advanced past this.  Surgery was consulted and she underwent a small bowel resection and Hartman's resection and  colostomy on 10/01/2023.  She continued to have postoperative ileus managed by general surgery.  NG remains in place to suction.  Hospital course also complicated by acute PE and left lower extremity DVT she was started on heparin.  She subsequently developed acute blood loss anemia and subsequent pelvic hematoma near the small bowel anastomosis as well as notable hematuria.  Urology was consulted.  Signed off after hematuria resolution.  Given high risk for bleeding with notable PE DVT evaluated by IR and placed IVC filter 10/05/2023.  Patient required total of 4 units of packed red blood cell.  As her bowel function did not return she underwent a repeat CT abdomen and pelvis on 10/31st showing anastomotic leak into 7 x 6 cm complex fluid collection posteriorly in the pelvis.  IR was consulted and underwent CT-guided percutaneous drain placement  into pelvic abscess.  Fluid grew MRSA.  ID consulted and once transitioned to daptomycin and Zosyn.  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  ID recommended no further need of antibiotics.  Awaiting his SNF currently   Assessment & Plan:   Principal Problem:   Large bowel obstruction (HCC) Active Problems:   Acute colitis   Left leg DVT (HCC)   ABLA (acute blood loss anemia)   Palliative care encounter   Counseling and coordination of care   Need for emotional support   Goals of care, counseling/discussion   High risk medication use   Pain   Medication management   Pressure injury of skin   Malnutrition of moderate degree  1-Large bowel obstruction Postop.  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  -Patient presented with abdominal pain.  CT abdomen 10/17 proximal sigmoid colon thickening -GI consulted underwent sigmoidoscopy 10/19 which showed tight stricture found to have a mass, biopsy was negative for malignancy as suggesting inflammation. -Status post exploratory laparotomy Hartman's resection 10/21st with a small bowel resection and ostomy by Dr. [name]. -CT abdomen and pelvis 10/31st show a small leak at her anastomosis going into 6 x 7 cm fluid collection. -Underwent drainage catheter placement of pelvic abscess by IR on 11/1 -Fluid grew MRSA, ID consulted.  She was treated with daptomycin and Zosyn. -Repeated CT abdomen and pelvis 11/7 show percutaneous pigtail drain catheter, no persistent anastomotic leak or fistula. -11/11 pelvic drain injection study showed fistulous communication with colon/staple line anastomosis.  Drain to remain in place. -Per ID  no further indication to continue antibiotics.  Antibiotics discontinued -Continue TPN, soft diet, surgery following -Plan to continue with  calori count over weekend.  Having some abdominal discomfort, gas, Sx order simethicone, KUB and Suppository   Hyponatremia D10 was discontinued.  Continue to monitor sodium level   Acute blood loss anemia status post perianastomotic hematoma with hematuria Status post 4 units of packed red blood cell.  Status post IVC filter -Evaluated by urology no indication for cystoscopy or intervention Hb has remained stable   Acute PE without right heart strain with acute left DVT Unable to tolerate anticoagulation and status post IVC filter placement 10/25th Correction, no evidece of right heart strain on CT scan CT chest. ECHO normal RV function.   Thyroid nodule in the inferior aspect of the isthmus Outpatient follow-up  Paroxysmal A-fib Controlled.  Not on anticoagulation  Hypokalemia hypophosphatemia hypomagnesemia Replace as needed  Urinary retention: continue with foley catheter.   Hypotension; resolved  Leukocytosis Resolved.   Pressure injury documentation Local care.   Moderate protein caloric malnutrition On TPN    See wound care documentation below  Pressure Injury 10/08/23 Buttocks Right;Mid Stage 2 -  Partial thickness loss of dermis presenting as a shallow open injury with a red, pink wound bed without slough. (Active)  10/08/23 0800  Location: Buttocks  Location Orientation: Right;Mid  Staging: Stage 2 -  Partial thickness loss of dermis presenting as a shallow open injury with a red, pink wound bed without slough.  Wound Description (Comments):   Present on Admission:   Dressing Type Foam - Lift dressing to assess site every shift 10/26/23 1122     Nutrition Problem: Moderate Malnutrition Etiology: chronic illness    Signs/Symptoms: moderate fat depletion, severe muscle depletion    Interventions: Refer to RD note for recommendations, TPN  Estimated body mass index is 22.82 kg/m as calculated from the following:   Height as of this encounter:  5\' 5"  (1.651 m).   Weight as of this encounter: 62.2 kg.   DVT prophylaxis: SCD Code Status: DNR Family Communication: care discussed with patient.  Disposition Plan:  Status is: Inpatient Remains inpatient appropriate because: management. Of SBP    Consultants:  Surgery Urology IR ID Procedures:  10/21 s/p ex lap hartman's resection , small bowel resection and ostomy.  IVC filter placement  Antimicrobials:   Subjective Report feeling abdominal discomfort, gas, not feeling good today   Objective: Vitals:   10/26/23 0300 10/26/23 0505 10/26/23 1013 10/26/23 1122  BP:  118/63 131/83   Pulse:  (!) 58 71   Resp:  18    Temp: 98.8 F (37.1 C)   98.7 F (37.1 C)  TempSrc: Oral   Oral  SpO2:  97% 97%   Weight:      Height:        Intake/Output Summary (Last 24 hours) at 10/26/2023 1258 Last data filed at 10/26/2023 1000 Gross per 24 hour  Intake 2412.93 ml  Output 1455 ml  Net 957.93 ml   Filed Weights   10/21/23 0500 10/23/23 0500 10/25/23 0713  Weight: 61 kg 62 kg 62.2 kg    Examination:  General exam: NAD Respiratory system: CTA Cardiovascular system: S 1, S 2 RRR Gastrointestinal system: BS present, soft, nt, ostomy in place, drain placed. Wound cover Central nervous system:Alert Extremities: No edema  Data Reviewed: I have personally reviewed following labs and imaging studies  CBC: Recent Labs  Lab  10/20/23 0549 10/23/23 0312 10/24/23 0334  WBC 8.6 7.7 6.9  HGB 10.4* 10.1* 10.3*  HCT 31.9* 31.5* 32.1*  MCV 95.8 96.3 95.8  PLT 312 270 265   Basic Metabolic Panel: Recent Labs  Lab 10/20/23 0549 10/21/23 0400 10/22/23 0354 10/23/23 1846 10/24/23 0334 10/25/23 0350  NA 125* 131* 128*  --  131* 131*  K 3.6 3.8 3.7 4.1 3.9 3.8  CL 98 103 100  --  104 103  CO2 20* 23 22  --  21* 23  GLUCOSE 137* 126* 121*  --  119* 115*  BUN 17 15 18   --  21 19  CREATININE 0.61 0.49 0.65  --  0.44 0.55  CALCIUM 7.8* 8.2* 8.1*  --  8.3* 8.5*  MG 1.8  2.1 1.9 1.9 1.9 1.8  PHOS 2.3* 2.5 2.9  --  2.8 3.2   GFR: Estimated Creatinine Clearance: 47.9 mL/min (by C-G formula based on SCr of 0.55 mg/dL). Liver Function Tests: Recent Labs  Lab 10/22/23 0354 10/25/23 0350  AST 48* 47*  ALT 59* 65*  ALKPHOS 205* 180*  BILITOT 0.6 0.5  PROT 6.2* 7.1  ALBUMIN 2.1* 2.1*   No results for input(s): "LIPASE", "AMYLASE" in the last 168 hours. No results for input(s): "AMMONIA" in the last 168 hours. Coagulation Profile: No results for input(s): "INR", "PROTIME" in the last 168 hours. Cardiac Enzymes: No results for input(s): "CKTOTAL", "CKMB", "CKMBINDEX", "TROPONINI" in the last 168 hours. BNP (last 3 results) No results for input(s): "PROBNP" in the last 8760 hours. HbA1C: No results for input(s): "HGBA1C" in the last 72 hours. CBG: Recent Labs  Lab 10/22/23 0829 10/22/23 1707 10/22/23 2352 10/23/23 0729 10/23/23 1632  GLUCAP 106* 112* 130* 132* 122*   Lipid Profile: Recent Labs    10/25/23 0350 10/26/23 0451  TRIG 342* 156*   Thyroid Function Tests: No results for input(s): "TSH", "T4TOTAL", "FREET4", "T3FREE", "THYROIDAB" in the last 72 hours. Anemia Panel: No results for input(s): "VITAMINB12", "FOLATE", "FERRITIN", "TIBC", "IRON", "RETICCTPCT" in the last 72 hours. Sepsis Labs: No results for input(s): "PROCALCITON", "LATICACIDVEN" in the last 168 hours.  No results found for this or any previous visit (from the past 240 hour(s)).       Radiology Studies: No results found.      Scheduled Meds:  (feeding supplement) PROSource Plus  30 mL Oral BID BM   Chlorhexidine Gluconate Cloth  6 each Topical Daily   docusate sodium  100 mg Oral BID   Gerhardt's butt cream   Topical BID   ketotifen  2 drop Both Eyes BID   [START ON 10/27/2023] multivitamin with minerals  1 tablet Oral Daily   polyethylene glycol  17 g Oral Daily   sodium chloride flush  10-40 mL Intracatheter Q12H   Continuous Infusions:  TPN  (CLINIMIX-E) Adult     And   fat emul(SMOFlipid)     TPN (CLINIMIX-E) Adult 82 mL/hr at 10/25/23 1717     LOS: 29 days    Time spent: 35 minutes    Marlyn Rabine A Michol Emory, MD [facility]   If 7PM-7AM, please contact night-coverage [email or web address]  10/26/2023, 12:58 PM

## 2023-10-26 NOTE — Progress Notes (Signed)
Progress Note  25 Days Post-Op  Subjective: Pt expressed feeling more tired today and like she just wants to go home and be with her husband. She feels like she has burdened her family. She does not feel ready to discuss GOC with palliative since her family is out of town until Sunday. She would not want to speak with palliative until family could be present also. She has also had some better days and is not sure whether she would want to pursue comfort care at this time. We discussed that surgically she is not in imminent danger of dying - the biggest factor would just be if she stopped eating. We discussed that this would be a factor even if she had not had surgery and she understands this. She reports more gas pain and a little nausea this AM but no vomiting. She is still having stool output.   Objective: Vital signs in last 24 hours: Temp:  [97.9 F (36.6 C)-98.8 F (37.1 C)] 98.8 F (37.1 C) (11/15 0300) Pulse Rate:  [50-75] 58 (11/15 0505) Resp:  [14-18] 18 (11/15 0505) BP: (115-137)/(58-72) 118/63 (11/15 0505) SpO2:  [97 %-100 %] 97 % (11/15 0505) Last BM Date : 10/25/23  Intake/Output from previous day: 11/14 0701 - 11/15 0700 In: 2351.2 [P.O.:720; I.V.:1631.2] Out: 1455 [Urine:1050; Drains:5; Stool:400] Intake/Output this shift: No intake/output data recorded.  PE: General: pleasant, WD, thin female, NAD Lungs: Respiratory effort nonlabored Abd: midline dressing clean, drain with scant clear brown fluid, ND, stoma viable with stool output Psych: A&Ox3 with a depressed affect.    Lab Results:  Recent Labs    10/24/23 0334  WBC 6.9  HGB 10.3*  HCT 32.1*  PLT 265   BMET Recent Labs    10/24/23 0334 10/25/23 0350  NA 131* 131*  K 3.9 3.8  CL 104 103  CO2 21* 23  GLUCOSE 119* 115*  BUN 21 19  CREATININE 0.44 0.55  CALCIUM 8.3* 8.5*   PT/INR No results for input(s): "LABPROT", "INR" in the last 72 hours. CMP     Component Value Date/Time   NA 131 (L)  10/25/2023 0350   K 3.8 10/25/2023 0350   CL 103 10/25/2023 0350   CO2 23 10/25/2023 0350   GLUCOSE 115 (H) 10/25/2023 0350   BUN 19 10/25/2023 0350   CREATININE 0.55 10/25/2023 0350   CALCIUM 8.5 (L) 10/25/2023 0350   PROT 7.1 10/25/2023 0350   ALBUMIN 2.1 (L) 10/25/2023 0350   AST 47 (H) 10/25/2023 0350   ALT 65 (H) 10/25/2023 0350   ALKPHOS 180 (H) 10/25/2023 0350   BILITOT 0.5 10/25/2023 0350   GFRNONAA >60 10/25/2023 0350   GFRAA >60 04/10/2016 1352   Lipase     Component Value Date/Time   LIPASE 41 09/27/2023 2007       Studies/Results: No results found.  Anti-infectives: Anti-infectives (From admission, onward)    Start     Dose/Rate Route Frequency Ordered Stop   10/15/23 1800  vancomycin (VANCOCIN) IVPB 1000 mg/200 mL premix  Status:  Discontinued        1,000 mg 200 mL/hr over 60 Minutes Intravenous Every 24 hours 10/14/23 1740 10/15/23 1045   10/15/23 1430  piperacillin-tazobactam (ZOSYN) IVPB 3.375 g        3.375 g 100 mL/hr over 30 Minutes Intravenous  Once 10/15/23 1343 10/15/23 1503   10/15/23 1400  DAPTOmycin (CUBICIN) IVPB 500 mg/74mL premix  Status:  Discontinued  8 mg/kg  64.1 kg 100 mL/hr over 30 Minutes Intravenous Daily 10/15/23 1045 10/22/23 1321   10/14/23 1800  vancomycin (VANCOREADY) IVPB 1250 mg/250 mL        1,250 mg 166.7 mL/hr over 90 Minutes Intravenous  Once 10/14/23 1740 10/14/23 2255   10/14/23 1400  piperacillin-tazobactam (ZOSYN) IVPB 3.375 g  Status:  Discontinued        3.375 g 12.5 mL/hr over 240 Minutes Intravenous Every 8 hours 10/14/23 0845 10/22/23 1321   10/14/23 0900  piperacillin-tazobactam (ZOSYN) IVPB 3.375 g        3.375 g 100 mL/hr over 30 Minutes Intravenous NOW 10/14/23 0844 10/14/23 1002   10/05/23 1200  piperacillin-tazobactam (ZOSYN) IVPB 3.375 g        3.375 g 12.5 mL/hr over 240 Minutes Intravenous Every 8 hours 10/05/23 0721 10/09/23 1716   10/04/23 2000  piperacillin-tazobactam (ZOSYN) IVPB 3.375 g   Status:  Discontinued        3.375 g 12.5 mL/hr over 240 Minutes Intravenous Every 8 hours 10/04/23 1827 10/05/23 0721   09/27/23 2315  piperacillin-tazobactam (ZOSYN) IVPB 3.375 g  Status:  Discontinued        3.375 g 12.5 mL/hr over 240 Minutes Intravenous Every 8 hours 09/27/23 2305 10/02/23 1106        Assessment/Plan  POD25 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 [name] - CT 10/24 with hematoma in the pelvis adjacent to small bowel anastomosis and ileus vs obstruction  - IR 11/1: drain placement for small bowel anastomotic leak/ abscess drainage - CT 11/8 with resolution in previous right hemipelvic collection and no leakage of enteric contrast - drain injection study 11/11 confirms fistulous connection - drain left in place. Stop flushing drain and placed to gravity  - check KUB today  - reg diet and TPN, supplements  - restart calorie count today and extend through the weekend  - getting in enough nutritionally will be key to trying to heal fistula  - reorder stomal suppository, continue miralax, added simethicone prn  - continue daily dressing changes     FEN: PICC/TPN; reg diet  VTE: IVC Filter  ID: no current abx   - per TRH -  ABL anemia secondary to post-op hematomas and hematuria  Acute PE and LLE DVT Pancreatic tail cyst Thyroid nodule  Chronic HFpEF Paroxysmal A. Fib Pre-diabetes RLS   LOS: 29 days    Juliet Rude, [facility] 10/26/2023, 9:26 AM Please see Amion for pager number during day hours 7:00am-4:30pm

## 2023-10-26 NOTE — TOC Progression Note (Signed)
Transition of Care [facility]) - Progression Note   Patient Details  Name: Elizabeth Odom MRN: [id number] Date of Birth: Mar 21, 1941  Transition of Care [facility]) CM/SW Contact  Ewing Schlein, LCSW Phone Number: 10/26/2023, 10:48 AM  Clinical Narrative: CSW met with patient to discuss SNF. Patient agreeable to being faxed out, but does not want to choose a facility until her granddaughter returns from out of town on 11/17. Patient is expected to be weaned off TPN by 11/18. FL2 completed; PASRR received. Initial referral faxed out. TOC awaiting bed offers.  Expected Discharge Plan: Home w Home Health Services Barriers to Discharge: Continued Medical Work up  Expected Discharge Plan and Services In-house Referral: NA Discharge Planning Services: CM Consult Post Acute Care Choice: Home Health Living arrangements for the past 2 months: Apartment              DME Arranged: N/A DME Agency: NA HH Arranged: PT, RN HH Agency: [facility] Date HH Agency Contacted: 10/02/23 Time HH Agency Contacted: 1550 Representative spoke with at [facility] Agency: Morrie Sheldon  Social Determinants of Health (SDOH) Interventions SDOH Screenings   Food Insecurity: No Food Insecurity (09/28/2023)  Housing: Low Risk  (09/28/2023)  Transportation Needs: No Transportation Needs (09/28/2023)  Utilities: Not At Risk (09/28/2023)  Tobacco Use: Low Risk  (10/04/2023)   Readmission Risk Interventions    10/19/2023    6:13 PM  Readmission Risk Prevention Plan  Transportation Screening Complete  PCP or Specialist Appt within 5-7 Days Complete  Home Care Screening Complete  Medication Review (RN CM) Complete

## 2023-10-26 NOTE — NC FL2 (Signed)
[place] MEDICAID [place] [facility] LEVEL OF CARE FORM     IDENTIFICATION  Patient Name: Elizabeth Odom Birthdate: 06-28-1941 Sex: female Admission Date (Current Location): 09/27/2023  [facility] and [place] Number:  Producer, television/film/video and Address:  [facility],  [address] [place], [place] [address]      Provider Number: [id number]  Attending Physician Name and Address:  Alba Cory, MD  Relative Name and Phone Number:  [name] (granddaughter) Ph: [phone]    Current Level of Care: Hospital Recommended Level of Care: Skilled Nursing Facility Prior Approval Number:    Date Approved/Denied:   PASRR Number: [id number]  Discharge Plan: SNF    Current Diagnoses: Patient Active Problem List   Diagnosis Date Noted   Malnutrition of moderate degree 10/17/2023   High risk medication use 10/16/2023   Pain 10/16/2023   Medication management 10/16/2023   Pressure injury of skin 10/16/2023   Large bowel obstruction (HCC) 10/05/2023   Left leg DVT (HCC) 10/05/2023   ABLA (acute blood loss anemia) 10/05/2023   Palliative care encounter 10/05/2023   Counseling and coordination of care 10/05/2023   Need for emotional support 10/05/2023   Goals of care, counseling/discussion 10/05/2023   Acute colitis 09/27/2023   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   Restless leg    Multiple fractures of cervical spine (HCC) 04/09/2016   Cervical spine fracture, initial encounter 04/09/2016   Syncope and collapse 04/09/2016   A-fib (HCC) 04/09/2016   Acute head injury 04/09/2016    Orientation RESPIRATION BLADDER Height & Weight     Self, Time, Situation, Place  Normal Continent Weight: 137 lb 2 oz (62.2 kg) Height:  5\' 5"  (165.1 cm)  BEHAVIORAL SYMPTOMS/MOOD NEUROLOGICAL BOWEL NUTRITION STATUS      Colostomy Diet (See discharge summary.)  AMBULATORY STATUS COMMUNICATION OF NEEDS Skin   Extensive Assist Verbally Surgical wounds, Other  (Comment) (Ecchymosis: scattered; Erythema: buttocks, perineum)                       Personal Care Assistance Level of Assistance  Bathing, Feeding, Dressing Bathing Assistance: Limited assistance Feeding assistance: Independent Dressing Assistance: Limited assistance     Functional Limitations Info  Sight, Hearing, Speech Sight Info: Impaired Hearing Info: Adequate Speech Info: Adequate    SPECIAL CARE FACTORS FREQUENCY  PT (By licensed PT), OT (By licensed OT)     PT Frequency: 5x's/week OT Frequency: 5x's/week            Contractures Contractures Info: Not present    Additional Factors Info  Code Status, Allergies Code Status Info: DNR Allergies Info: Elemental Sulfur           Current Medications (10/26/2023):  This is the current hospital active medication list Current Facility-Administered Medications  Medication Dose Route Frequency Provider Last Rate Last Admin   (feeding supplement) PROSource Plus liquid 30 mL  30 mL Oral BID BM Regalado, Belkys A, MD   30 mL at 10/25/23 1455   acetaminophen (TYLENOL) tablet 650 mg  650 mg Oral Q6H PRN Juliet Rude, PA-C       bisacodyl (DULCOLAX) suppository 10 mg  10 mg Rectal Once Juliet Rude, PA-C       Chlorhexidine Gluconate Cloth 2 % PADS 6 each  6 each Topical Daily Rai, Ripudeep K, MD   6 each at 10/25/23 1450   docusate sodium (COLACE) capsule 100 mg  100 mg Oral BID Laural Benes,  Felicity Coyer, PA-C       Gerhardt's butt cream   Topical BID Regalado, Belkys A, MD   Given at 10/25/23 2321   HYDROmorphone (DILAUDID) injection 0.5 mg  0.5 mg Intravenous Q4H PRN Juliet Rude, PA-C   0.5 mg at 10/25/23 2309   ketotifen (ZADITOR) 0.035 % ophthalmic solution 2 drop  2 drop Both Eyes BID Rai, Ripudeep K, MD   2 drop at 10/25/23 2314   liver oil-zinc oxide (DESITIN) 40 % ointment   Topical BID PRN Rai, Delene Ruffini, MD   1 Application at 10/06/23 2108   LORazepam (ATIVAN) injection 0.5 mg  0.5 mg Intravenous Q4H PRN  Rai, Ripudeep K, MD   0.5 mg at 10/20/23 2100   LORazepam (ATIVAN) tablet 0.5 mg  0.5 mg Oral Once PRN Rai, Ripudeep K, MD       ondansetron (ZOFRAN) tablet 4 mg  4 mg Oral Q6H PRN Rai, Ripudeep K, MD       Or   ondansetron (ZOFRAN) injection 4 mg  4 mg Intravenous Q6H PRN Rai, Ripudeep K, MD   4 mg at 10/05/23 0616   phenol (CHLORASEPTIC) mouth spray 1 spray  1 spray Mouth/Throat PRN Rai, Ripudeep K, MD   1 spray at 10/01/23 1504   polyethylene glycol (MIRALAX / GLYCOLAX) packet 17 g  17 g Oral Daily Juliet Rude, PA-C   17 g at 10/25/23 1123   polyvinyl alcohol (LIQUIFILM TEARS) 1.4 % ophthalmic solution 1 drop  1 drop Both Eyes PRN Rai, Ripudeep K, MD   1 drop at 10/20/23 2100   prochlorperazine (COMPAZINE) injection 10 mg  10 mg Intravenous Q6H PRN Rai, Ripudeep K, MD   10 mg at 10/05/23 0647   simethicone (MYLICON) chewable tablet 80 mg  80 mg Oral Q6H PRN Trixie Deis R, PA-C       sodium chloride flush (NS) 0.9 % injection 10-40 mL  10-40 mL Intracatheter Q12H Rai, Ripudeep K, MD   10 mL at 10/25/23 2315   sodium chloride flush (NS) 0.9 % injection 10-40 mL  10-40 mL Intracatheter PRN Rai, Delene Ruffini, MD       TPN (CLINIMIX-E) Adult   Intravenous Continuous TPN Cindi Carbon, RPH 82 mL/hr at 10/25/23 1717 New Bag at 10/25/23 1717   traMADol (ULTRAM) tablet 50 mg  50 mg Oral Q6H PRN Juliet Rude, PA-C         Discharge Medications: Please see discharge summary for a list of discharge medications.  Relevant Imaging Results:  Relevant Lab Results:   Additional Information SSN: [id number]. Patient is expected to be weaned off TPN by 10/29/23.  Ewing Schlein, LCSW

## 2023-10-27 DIAGNOSIS — K56609 Unspecified intestinal obstruction, unspecified as to partial versus complete obstruction: Secondary | ICD-10-CM | POA: Diagnosis not present

## 2023-10-27 LAB — BASIC METABOLIC PANEL
Anion gap: 6 (ref 5–15)
BUN: 26 mg/dL — ABNORMAL HIGH (ref 8–23)
CO2: 21 mmol/L — ABNORMAL LOW (ref 22–32)
Calcium: 8.6 mg/dL — ABNORMAL LOW (ref 8.9–10.3)
Chloride: 104 mmol/L (ref 98–111)
Creatinine, Ser: 0.46 mg/dL (ref 0.44–1.00)
GFR, Estimated: >60 mL/min (ref >60)
Glucose, Bld: 111 mg/dL — ABNORMAL HIGH (ref 70–99)
Potassium: 3.7 mmol/L (ref 3.5–5.1)
Sodium: 131 mmol/L — ABNORMAL LOW (ref 135–145)

## 2023-10-27 LAB — CBC
HCT: 30.9 % — ABNORMAL LOW (ref 36.0–46.0)
Hemoglobin: 10 g/dL — ABNORMAL LOW (ref 12.0–15.0)
MCH: 31.1 pg (ref 26.0–34.0)
MCHC: 32.4 g/dL (ref 30.0–36.0)
MCV: 96 fL (ref 80.0–100.0)
Platelets: 219 10*3/uL (ref 150–400)
RBC: 3.22 MIL/uL — ABNORMAL LOW (ref 3.87–5.11)
RDW: 15.9 % — ABNORMAL HIGH (ref 11.5–15.5)
WBC: 7 10*3/uL (ref 4.0–10.5)
nRBC: 0 % (ref 0.0–0.2)

## 2023-10-27 LAB — MAGNESIUM: Magnesium: 1.9 mg/dL (ref 1.7–2.4)

## 2023-10-27 LAB — PHOSPHORUS: Phosphorus: 3.3 mg/dL (ref 2.5–4.6)

## 2023-10-27 MED ORDER — FAT EMUL FISH OIL/PLANT BASED 20% (SMOFLIPID)IV EMUL
250.0000 mL | INTRAVENOUS | Status: AC
  Administered 2023-10-27: 250 mL via INTRAVENOUS
  Filled 2023-10-27: qty 250

## 2023-10-27 MED ORDER — CLINIMIX E/DEXTROSE (8/10) 8 % IV SOLN
41.0000 mL/h | INTRAVENOUS | Status: AC
  Filled 2023-10-27: qty 984

## 2023-10-27 NOTE — Plan of Care (Signed)
  Problem: Coping: Goal: Ability to adjust to condition or change in health will improve 10/27/2023 2324 by Kizzie Bane, RN Outcome: Progressing 10/27/2023 2324 by Kizzie Bane, RN Outcome: Progressing   Problem: Nutritional: Goal: Maintenance of adequate nutrition will improve 10/27/2023 2324 by Kizzie Bane, RN Outcome: Progressing 10/27/2023 2324 by Kizzie Bane, RN Outcome: Progressing Goal: Progress toward achieving an optimal weight will improve 10/27/2023 2324 by Kizzie Bane, RN Outcome: Progressing 10/27/2023 2324 by Kizzie Bane, RN Outcome: Progressing   Problem: Pain Managment: Goal: General experience of comfort will improve 10/27/2023 2324 by Kizzie Bane, RN Outcome: Progressing 10/27/2023 2324 by Kizzie Bane, RN Outcome: Progressing

## 2023-10-27 NOTE — Progress Notes (Signed)
26 Days Post-Op   Subjective/Chief Complaint: Having bowel function, taking some food   Objective: Vital signs in last 24 hours: Temp:  [97.6 F (36.4 C)-98.7 F (37.1 C)] 97.8 F (36.6 C) (11/16 0421) Pulse Rate:  [65-80] 65 (11/16 0421) Resp:  [17-18] 17 (11/16 0421) BP: (106-131)/(59-83) 106/66 (11/16 0421) SpO2:  [97 %-98 %] 98 % (11/16 0421) Last BM Date : 10/27/23  Intake/Output from previous day: 11/15 0701 - 11/16 0700 In: 2158.9 [P.O.:720; I.V.:1438.9] Out: 1158 [Urine:1000; Drains:8; Stool:150] Intake/Output this shift: No intake/output data recorded.  Ab soft nontender, nondistended wound is open and clean  Lab Results:  Recent Labs    10/27/23 0312  WBC 7.0  HGB 10.0*  HCT 30.9*  PLT 219   BMET Recent Labs    10/25/23 0350 10/27/23 0312  NA 131* 131*  K 3.8 3.7  CL 103 104  CO2 23 21*  GLUCOSE 115* 111*  BUN 19 26*  CREATININE 0.55 0.46  CALCIUM 8.5* 8.6*   PT/INR No results for input(s): "LABPROT", "INR" in the last 72 hours. ABG No results for input(s): "PHART", "HCO3" in the last 72 hours.  Invalid input(s): "PCO2", "PO2"  Studies/Results: DG Abd Portable 1V  Result Date: 10/26/2023 CLINICAL DATA:  Acute generalized abdominal pain and nausea. EXAM: PORTABLE ABDOMEN - 1 VIEW COMPARISON:  October 08, 2023. FINDINGS: Percutaneous drainage catheter is noted in right lower quadrant. No abnormal bowel dilatation is noted. Contrast is noted in nondilated colon. IMPRESSION: No abnormal bowel dilatation. Percutaneous drainage catheter noted in right lower quadrant. Electronically Signed   By: Lupita Raider M.D.   On: 10/26/2023 15:26    Anti-infectives: Anti-infectives (From admission, onward)    Start     Dose/Rate Route Frequency Ordered Stop   10/15/23 1800  vancomycin (VANCOCIN) IVPB 1000 mg/200 mL premix  Status:  Discontinued        1,000 mg 200 mL/hr over 60 Minutes Intravenous Every 24 hours 10/14/23 1740 10/15/23 1045   10/15/23  1430  piperacillin-tazobactam (ZOSYN) IVPB 3.375 g        3.375 g 100 mL/hr over 30 Minutes Intravenous  Once 10/15/23 1343 10/15/23 1503   10/15/23 1400  DAPTOmycin (CUBICIN) IVPB 500 mg/83mL premix  Status:  Discontinued        8 mg/kg  64.1 kg 100 mL/hr over 30 Minutes Intravenous Daily 10/15/23 1045 10/22/23 1321   10/14/23 1800  vancomycin (VANCOREADY) IVPB 1250 mg/250 mL        1,250 mg 166.7 mL/hr over 90 Minutes Intravenous  Once 10/14/23 1740 10/14/23 2255   10/14/23 1400  piperacillin-tazobactam (ZOSYN) IVPB 3.375 g  Status:  Discontinued        3.375 g 12.5 mL/hr over 240 Minutes Intravenous Every 8 hours 10/14/23 0845 10/22/23 1321   10/14/23 0900  piperacillin-tazobactam (ZOSYN) IVPB 3.375 g        3.375 g 100 mL/hr over 30 Minutes Intravenous NOW 10/14/23 0844 10/14/23 1002   10/05/23 1200  piperacillin-tazobactam (ZOSYN) IVPB 3.375 g        3.375 g 12.5 mL/hr over 240 Minutes Intravenous Every 8 hours 10/05/23 0721 10/09/23 1716   10/04/23 2000  piperacillin-tazobactam (ZOSYN) IVPB 3.375 g  Status:  Discontinued        3.375 g 12.5 mL/hr over 240 Minutes Intravenous Every 8 hours 10/04/23 1827 10/05/23 0721   09/27/23 2315  piperacillin-tazobactam (ZOSYN) IVPB 3.375 g  Status:  Discontinued        3.375  g 12.5 mL/hr over 240 Minutes Intravenous Every 8 hours 09/27/23 2305 10/02/23 1106       Assessment/Plan:  POD 26 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 [name] - CT 10/24 with hematoma in the pelvis adjacent to small bowel anastomosis and ileus vs obstruction  - IR 11/1: drain placement for small bowel anastomotic leak/ abscess drainage - CT 11/8 with resolution in previous right hemipelvic collection and no leakage of enteric contrast - drain injection study 11/11 confirms fistulous connection - drain left in place. Stop flushing drain and placed to gravity  - reg diet and TPN, supplements  - restart calorie count today and  extend through the weekend  - getting in enough nutritionally will be key to trying to heal fistula  - continue daily dressing changes    FEN: PICC/TPN; reg diet  VTE: IVC Filter  ID: no current abx    ABL anemia secondary to post-op hematomas and hematuria  Acute PE and LLE DVT Pancreatic tail cyst Thyroid nodule  Chronic HFpEF Paroxysmal A. Fib Pre-diabetes RLS    Elizabeth Odom 10/27/2023

## 2023-10-27 NOTE — Plan of Care (Signed)
  Problem: Coping: Goal: Ability to adjust to condition or change in health will improve Outcome: Progressing   Problem: Nutritional: Goal: Maintenance of adequate nutrition will improve Outcome: Progressing Goal: Progress toward achieving an optimal weight will improve Outcome: Progressing   Problem: Pain Managment: Goal: General experience of comfort will improve Outcome: Progressing   Problem: Safety: Goal: Ability to remain free from injury will improve Outcome: Progressing

## 2023-10-27 NOTE — Progress Notes (Signed)
PROGRESS NOTE    Elizabeth Odom  ZOX:[id number] DOB: 1941-08-25 DOA: 09/27/2023 PCP: [name], FNP   Brief Narrative: 82 year old with past medical history significant for A-fib, RLS, anemia presented to the ED 10/17 with recurrent abdominal pain, nausea emesis, diarrhea intolerance of oral intake.  She was evaluated in the ED 10/10 for abdominal pain, decreased appetite, fever, diagnosed with possible colitis noted on CT abdomen and pelvis discharged on Augmentin.  She had recurrence of symptoms and presented back to the hospital.  CT initially showed proximal sigmoid colon wall thickening with upstream colonic dilation with air-fluid levels and large amount of stool compatible with obstruction.  9 mm cystic lesion in the tail of the pancreas and subcentimeter lucent lesion in L1 vertebral body.  GI consulted, patient refused colonoscopy but agreed to sigmoidoscopy showing stricture versus mass at the rectosigmoid region.  Scope could not be advanced past this.  Surgery was consulted and she underwent a small bowel resection and Hartman's resection and  colostomy on 10/01/2023.  She continued to have postoperative ileus managed by general surgery.  NG remains in place to suction.  Hospital course also complicated by acute PE and left lower extremity DVT she was started on heparin.  She subsequently developed acute blood loss anemia and subsequent pelvic hematoma near the small bowel anastomosis as well as notable hematuria.  Urology was consulted.  Signed off after hematuria resolution.  Given high risk for bleeding with notable PE DVT evaluated by IR and placed IVC filter 10/05/2023.  Patient required total of 4 units of packed red blood cell.  As her bowel function did not return she underwent a repeat CT abdomen and pelvis on 10/31st showing anastomotic leak into 7 x 6 cm complex fluid collection posteriorly in the pelvis.  IR was consulted and underwent CT-guided percutaneous drain placement  into pelvic abscess.  Fluid grew MRSA.  ID consulted and once transitioned to daptomycin and Zosyn.  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  ID recommended no further need of antibiotics.  Awaiting his SNF currently   Assessment & Plan:   Principal Problem:   Large bowel obstruction (HCC) Active Problems:   Acute colitis   Left leg DVT (HCC)   ABLA (acute blood loss anemia)   Palliative care encounter   Counseling and coordination of care   Need for emotional support   Goals of care, counseling/discussion   High risk medication use   Pain   Medication management   Pressure injury of skin   Malnutrition of moderate degree  1-Large bowel obstruction Postop.  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  -Patient presented with abdominal pain.  CT abdomen 10/17 proximal sigmoid colon thickening -GI consulted underwent sigmoidoscopy 10/19 which showed tight stricture found to have a mass, biopsy was negative for malignancy as suggesting inflammation. -Status post exploratory laparotomy Hartman's resection 10/21st with a small bowel resection and ostomy by Dr. [name]. -CT abdomen and pelvis 10/31st show a small leak at her anastomosis going into 6 x 7 cm fluid collection. -Underwent drainage catheter placement of pelvic abscess by IR on 11/1 -Fluid grew MRSA, ID consulted.  She was treated with daptomycin and Zosyn. -Repeated CT abdomen and pelvis 11/7 show percutaneous pigtail drain catheter, no persistent anastomotic leak or fistula. -11/11 pelvic drain injection study showed fistulous communication with colon/staple line anastomosis.  Drain to remain in place. -Per ID  no further indication to continue antibiotics.  Antibiotics discontinued -Continue TPN, soft diet, surgery following -Plan to continue with  calori count over weekend.  -KUB 11/15 no obstruction.    Hyponatremia D10 was discontinued.  Continue to monitor sodium level   Acute blood loss anemia status post perianastomotic hematoma with hematuria Status post 4 units of packed red blood cell.  Status post IVC filter -Evaluated by urology no indication for cystoscopy or intervention Hb has remained stable   Acute PE without right heart strain with acute left DVT Unable to tolerate anticoagulation and status post IVC filter placement 10/25th Correction, no evidece of right heart strain on CT scan CT chest. ECHO normal RV function.   Thyroid nodule in the inferior aspect of the isthmus Outpatient follow-up  Paroxysmal A-fib Controlled.  Not on anticoagulation  Hypokalemia hypophosphatemia hypomagnesemia Replace as needed  Urinary retention: continue with foley catheter.   Hypotension; resolved  Leukocytosis Resolved.   Pressure injury documentation Local care.   Moderate protein caloric malnutrition On TPN    See wound care documentation below  Pressure Injury 10/08/23 Buttocks Right;Mid Stage 2 -  Partial thickness loss of dermis presenting as a shallow open injury with a red, pink wound bed without slough. (Active)  10/08/23 0800  Location: Buttocks  Location Orientation: Right;Mid  Staging: Stage 2 -  Partial thickness loss of dermis presenting as a shallow open injury with a red, pink wound bed without slough.  Wound Description (Comments):   Present on Admission:   Dressing Type Foam - Lift dressing to assess site every shift 10/27/23 0000     Nutrition Problem: Moderate Malnutrition Etiology: chronic illness    Signs/Symptoms: moderate fat depletion, severe muscle depletion    Interventions: Refer to RD note for recommendations, TPN  Estimated body mass index is 22.82 kg/m as calculated from the following:   Height as of this encounter: 5\' 5"  (1.651 m).   Weight as of this encounter:  62.2 kg.   DVT prophylaxis: SCD Code Status: DNR Family Communication: care discussed with patient.  Disposition Plan:  Status is: Inpatient Remains inpatient appropriate because: management. Of SBP    Consultants:  Surgery Urology IR ID Procedures:  10/21 s/p ex lap hartman's resection , small bowel resection and ostomy.  IVC filter placement  Antimicrobials:   Subjective Does not have an appetitie.  Feels better from abdominal pain and distension.   Objective: Vitals:   10/26/23 1353 10/26/23 2258 10/27/23 0421 10/27/23 1347  BP: 113/63 (!) 129/59 106/66 125/69  Pulse: 80 65 65 66  Resp: 17 18 17 16   Temp: 97.6 F (36.4 C) 97.7 F (36.5 C) 97.8 F (36.6 C) 97.9 F (36.6 C)  TempSrc:  Oral    SpO2: 98% 98% 98% 100%  Weight:      Height:        Intake/Output Summary (Last 24 hours) at 10/27/2023 1439 Last data filed at 10/27/2023 1024 Gross per 24 hour  Intake 1972.77 ml  Output 1158 ml  Net 814.77 ml   Filed Weights   10/21/23 0500 10/23/23 0500 10/25/23 0713  Weight: 61 kg 62 kg 62.2 kg    Examination:  General exam: NAD Respiratory system: CTA Cardiovascular system: s 1, S  2 RRR Gastrointestinal system:BS present, soft, midline incision wound cover, ostomy in place. Drain in place.  Central nervous system:Alert Extremities: No edema  Data Reviewed: I have personally reviewed following labs and imaging  studies  CBC: Recent Labs  Lab 10/23/23 0312 10/24/23 0334 10/27/23 0312  WBC 7.7 6.9 7.0  HGB 10.1* 10.3* 10.0*  HCT 31.5* 32.1* 30.9*  MCV 96.3 95.8 96.0  PLT 270 265 219   Basic Metabolic Panel: Recent Labs  Lab 10/21/23 0400 10/22/23 0354 10/23/23 1846 10/24/23 0334 10/25/23 0350 10/27/23 0312  NA 131* 128*  --  131* 131* 131*  K 3.8 3.7 4.1 3.9 3.8 3.7  CL 103 100  --  104 103 104  CO2 23 22  --  21* 23 21*  GLUCOSE 126* 121*  --  119* 115* 111*  BUN 15 18  --  21 19 26*  CREATININE 0.49 0.65  --  0.44 0.55 0.46   CALCIUM 8.2* 8.1*  --  8.3* 8.5* 8.6*  MG 2.1 1.9 1.9 1.9 1.8 1.9  PHOS 2.5 2.9  --  2.8 3.2 3.3   GFR: Estimated Creatinine Clearance: 47.9 mL/min (by C-G formula based on SCr of 0.46 mg/dL). Liver Function Tests: Recent Labs  Lab 10/22/23 0354 10/25/23 0350  AST 48* 47*  ALT 59* 65*  ALKPHOS 205* 180*  BILITOT 0.6 0.5  PROT 6.2* 7.1  ALBUMIN 2.1* 2.1*   No results for input(s): "LIPASE", "AMYLASE" in the last 168 hours. No results for input(s): "AMMONIA" in the last 168 hours. Coagulation Profile: No results for input(s): "INR", "PROTIME" in the last 168 hours. Cardiac Enzymes: No results for input(s): "CKTOTAL", "CKMB", "CKMBINDEX", "TROPONINI" in the last 168 hours. BNP (last 3 results) No results for input(s): "PROBNP" in the last 8760 hours. HbA1C: No results for input(s): "HGBA1C" in the last 72 hours. CBG: Recent Labs  Lab 10/22/23 0829 10/22/23 1707 10/22/23 2352 10/23/23 0729 10/23/23 1632  GLUCAP 106* 112* 130* 132* 122*   Lipid Profile: Recent Labs    10/25/23 0350 10/26/23 0451  TRIG 342* 156*   Thyroid Function Tests: No results for input(s): "TSH", "T4TOTAL", "FREET4", "T3FREE", "THYROIDAB" in the last 72 hours. Anemia Panel: No results for input(s): "VITAMINB12", "FOLATE", "FERRITIN", "TIBC", "IRON", "RETICCTPCT" in the last 72 hours. Sepsis Labs: No results for input(s): "PROCALCITON", "LATICACIDVEN" in the last 168 hours.  No results found for this or any previous visit (from the past 240 hour(s)).       Radiology Studies: DG Abd Portable 1V  Result Date: 10/26/2023 CLINICAL DATA:  Acute generalized abdominal pain and nausea. EXAM: PORTABLE ABDOMEN - 1 VIEW COMPARISON:  October 08, 2023. FINDINGS: Percutaneous drainage catheter is noted in right lower quadrant. No abnormal bowel dilatation is noted. Contrast is noted in nondilated colon. IMPRESSION: No abnormal bowel dilatation. Percutaneous drainage catheter noted in right lower  quadrant. Electronically Signed   By: Lupita Raider M.D.   On: 10/26/2023 15:26        Scheduled Meds:  (feeding supplement) PROSource Plus  30 mL Oral BID BM   Chlorhexidine Gluconate Cloth  6 each Topical Daily   docusate sodium  100 mg Oral BID   Gerhardt's butt cream   Topical BID   ketotifen  2 drop Both Eyes BID   multivitamin with minerals  1 tablet Oral Daily   polyethylene glycol  17 g Oral Daily   sodium chloride flush  10-40 mL Intracatheter Q12H   Continuous Infusions:  .TPN (CLINIMIX-E) Adult     And   fat emul(SMOFlipid)     TPN (CLINIMIX-E) Adult 41 mL/hr at 10/26/23 1737     LOS: 30 days    Time  spent: 35 minutes    Alba Cory, MD [facility]   If 7PM-7AM, please contact night-coverage [email or web address]  10/27/2023, 2:39 PM

## 2023-10-27 NOTE — Progress Notes (Signed)
Patient has been repositioned multiple times for comfort, very difficult to accomplish, cream applied to irritated skin in peri-area, pillows under hips for support, sacral foam in place, pillows under legs repositioned, pillows under head adjusted, feet left uncovered per patient request, medicated with dilaudid as patient said "that tiny white pill does nothing for the pain" referring to tramadol, collected dinner slip for calorie count, also bought patient some [employer] cups from the vending machine per her request, foley and colostomy care completed, midline abdominal dressing CDI, fluids infusing well through PICC line, patient very grateful for time spent adjusting her, will continue to monitor.

## 2023-10-27 NOTE — Progress Notes (Signed)
PHARMACY - TOTAL PARENTERAL NUTRITION CONSULT NOTE   Indication:  Prolonged ileus  Patient Measurements: Height: 5\' 5"  (165.1 cm) Weight: 62.2 kg (137 lb 2 oz) IBW/kg (Calculated) : 57 TPN AdjBW (KG): 62 Body mass index is 22.82 kg/m. Usual Weight: 64.5 on 10/04/2023  Assessment:  TPN per pharmacy consult for this 82 yo female admitted on 09/27/2023 with recurrent abdominal pain, nausea, emesis, diarrhea and intolerance of oral intake. Surgery completed exp lap with small bowel resection and Hartman's resection and colostomy on 10/01/2023.  Post-op ileus, 10/31 imaging showed anastomotic leak - drain placed by IR on 11/1 for abscess.  Glucose / Insulin: No hx DM. Goal CBG <180 -SSI discontinued Electrolytes: Na (131) remains low - stable. -All other lytes WNL, including CorrCa -Bicarb low, chloride WNL Renal: SCr <1, BUN 26 Hepatic: LFTs slightly elevated but stable, Alk Phos trending down (11/14) -Albumin remains low -TG (156) decreased, now only slightly elevated Intake / Output; MIVF: no mIVF -UOP: 1000 mL. Drain output: 8 mL, ostomy: 150 mL GI Imaging: 10/10 CT A/P: small bowel enteritis/colitis that was also involving the sigmoid colon. No bowel obstruction seen.  10/17 CT A/P: wall thickening sigmoid colon worrisome for neoplasm. Focal colitis not excluded. Colonic dilation compatible with obstruction.  Cystic mass at the tail of the pancreas.  10/18 MR - 9 mm cystic lesion within pancreatic body/tail junction, colonic obstruction 10/31 CT A/P - colostomy and Hartmann's pouch seen.  Appears to be extravasation of contrast suggesting perforation or leakage. 11/8 CT A/P: No persistent anastomotic leak or fistula. GI Surgeries / Procedures:  10/19 flexible sigmoidoscopy 10/21 colectomy with colostomy creation/Hartman procedure 11/1: CT guided visceral fluid drain by perc cath  Central access: PICC line placed 11/1 TPN start date: 11/1  Nutritional Goals: Due to the Baxter  IV fluid disruption, we will be switching all adult compounded parenteral nutrition to premade Clinimix products +/- fat emulsion infusion. Our goal will be to continue providing as close to 100% of our patient's nutritional needs. Due to the volatile availability of different Clinimix concentrations we may not be able to meet 100% of adult patient nutritional goals at this time.    TPN: Clinimix E 8/10, 1L x 5 days per week and 2L x 2 days per week with daily lipids ( ) to provide 103 g protein and an average of 1353 kcal per day D10 previously infusing in addition to Clinimix to provide additional kcal, however, discontinued 11/9 given hyponatremia  RD Assessment:  Estimated Needs Total Energy Estimated Needs: 1900-2100 Total Protein Estimated Needs: 90-105g Total Fluid Estimated Needs: 2L/day  Current Nutrition:  TPN Diet: Regular. Calorie count in process.  Magic cup TID (each provides 290 kcal, 9 g protein) Prosource Plus PO BID (each provides 100 kcal, 15 g protein)  Plan:  At 1800: Continue TPN with Clinimix E 8/10 + SMOF lipids Clinimix 2L on Monday, Thursday (82 mL/hr).  1L all other days of the week (41 mL/hr) Electrolytes in TPN (unable to adjust): Na 35 mEq/L, K 30 mEq/L, Ca 4.5 mEq/L, Mg 5 mEq/L, and Phos 15 mmol/L. Continue PO MVI No CBG checks/SSI  Monitor TPN labs on Mon/Thurs and as needed Follow ability to tolerate advancing diet for ability to wean off of TPN.  72 hour calorie count through the weekend.     [name] PharmD, BCPS [facility] pharmacy [phone] 10/27/2023 10:06 AM

## 2023-10-28 DIAGNOSIS — K56609 Unspecified intestinal obstruction, unspecified as to partial versus complete obstruction: Secondary | ICD-10-CM | POA: Diagnosis not present

## 2023-10-28 LAB — BASIC METABOLIC PANEL
Anion gap: 6 (ref 5–15)
BUN: 21 mg/dL (ref 8–23)
CO2: 23 mmol/L (ref 22–32)
Calcium: 8.6 mg/dL — ABNORMAL LOW (ref 8.9–10.3)
Chloride: 103 mmol/L (ref 98–111)
Creatinine, Ser: 0.44 mg/dL (ref 0.44–1.00)
GFR, Estimated: >60 mL/min (ref >60)
Glucose, Bld: 123 mg/dL — ABNORMAL HIGH (ref 70–99)
Potassium: 3.9 mmol/L (ref 3.5–5.1)
Sodium: 132 mmol/L — ABNORMAL LOW (ref 135–145)

## 2023-10-28 MED ORDER — FAT EMUL FISH OIL/PLANT BASED 20% (SMOFLIPID)IV EMUL
250.0000 mL | INTRAVENOUS | Status: AC
  Administered 2023-10-28: 250 mL via INTRAVENOUS
  Filled 2023-10-28: qty 250

## 2023-10-28 MED ORDER — CLINIMIX E/DEXTROSE (8/10) 8 % IV SOLN
INTRAVENOUS | Status: AC
  Filled 2023-10-28 (×2): qty 1000

## 2023-10-28 NOTE — Progress Notes (Signed)
Patient sleeping soundly, no acute distress noted, will continue to monitor.

## 2023-10-28 NOTE — Progress Notes (Signed)
27 Days Post-Op   Subjective/Chief Complaint: Sleeping soundly this am   Objective: Vital signs in last 24 hours: Temp:  [97.9 F (36.6 C)-98.6 F (37 C)] 98.6 F (37 C) (11/17 0608) Pulse Rate:  [66-70] 66 (11/17 0608) Resp:  [16-18] 18 (11/17 0608) BP: (106-134)/(68-80) 106/68 (11/17 0608) SpO2:  [97 %-100 %] 97 % (11/17 0608) Last BM Date : 10/27/23  Intake/Output from previous day: 11/16 0701 - 11/17 0700 In: 1667.5 [P.O.:540; I.V.:1127.5] Out: 2750 [Urine:2750] Intake/Output this shift: No intake/output data recorded.  Fistula output appears minimal soft  Lab Results:  Recent Labs    10/27/23 0312  WBC 7.0  HGB 10.0*  HCT 30.9*  PLT 219   BMET Recent Labs    10/27/23 0312  NA 131*  K 3.7  CL 104  CO2 21*  GLUCOSE 111*  BUN 26*  CREATININE 0.46  CALCIUM 8.6*   PT/INR No results for input(s): "LABPROT", "INR" in the last 72 hours. ABG No results for input(s): "PHART", "HCO3" in the last 72 hours.  Invalid input(s): "PCO2", "PO2"  Studies/Results: DG Abd Portable 1V  Result Date: 10/26/2023 CLINICAL DATA:  Acute generalized abdominal pain and nausea. EXAM: PORTABLE ABDOMEN - 1 VIEW COMPARISON:  October 08, 2023. FINDINGS: Percutaneous drainage catheter is noted in right lower quadrant. No abnormal bowel dilatation is noted. Contrast is noted in nondilated colon. IMPRESSION: No abnormal bowel dilatation. Percutaneous drainage catheter noted in right lower quadrant. Electronically Signed   By: Lupita Raider M.D.   On: 10/26/2023 15:26    Anti-infectives: Anti-infectives (From admission, onward)    Start     Dose/Rate Route Frequency Ordered Stop   10/15/23 1800  vancomycin (VANCOCIN) IVPB 1000 mg/200 mL premix  Status:  Discontinued        1,000 mg 200 mL/hr over 60 Minutes Intravenous Every 24 hours 10/14/23 1740 10/15/23 1045   10/15/23 1430  piperacillin-tazobactam (ZOSYN) IVPB 3.375 g        3.375 g 100 mL/hr over 30 Minutes Intravenous   Once 10/15/23 1343 10/15/23 1503   10/15/23 1400  DAPTOmycin (CUBICIN) IVPB 500 mg/53mL premix  Status:  Discontinued        8 mg/kg  64.1 kg 100 mL/hr over 30 Minutes Intravenous Daily 10/15/23 1045 10/22/23 1321   10/14/23 1800  vancomycin (VANCOREADY) IVPB 1250 mg/250 mL        1,250 mg 166.7 mL/hr over 90 Minutes Intravenous  Once 10/14/23 1740 10/14/23 2255   10/14/23 1400  piperacillin-tazobactam (ZOSYN) IVPB 3.375 g  Status:  Discontinued        3.375 g 12.5 mL/hr over 240 Minutes Intravenous Every 8 hours 10/14/23 0845 10/22/23 1321   10/14/23 0900  piperacillin-tazobactam (ZOSYN) IVPB 3.375 g        3.375 g 100 mL/hr over 30 Minutes Intravenous NOW 10/14/23 0844 10/14/23 1002   10/05/23 1200  piperacillin-tazobactam (ZOSYN) IVPB 3.375 g        3.375 g 12.5 mL/hr over 240 Minutes Intravenous Every 8 hours 10/05/23 0721 10/09/23 1716   10/04/23 2000  piperacillin-tazobactam (ZOSYN) IVPB 3.375 g  Status:  Discontinued        3.375 g 12.5 mL/hr over 240 Minutes Intravenous Every 8 hours 10/04/23 1827 10/05/23 0721   09/27/23 2315  piperacillin-tazobactam (ZOSYN) IVPB 3.375 g  Status:  Discontinued        3.375 g 12.5 mL/hr over 240 Minutes Intravenous Every 8 hours 09/27/23 2305 10/02/23 1106  Assessment/Plan: POD 27 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 [name] - CT 10/24 with hematoma in the pelvis adjacent to small bowel anastomosis and ileus vs obstruction  - IR 11/1: drain placement for small bowel anastomotic leak/ abscess drainage - CT 11/8 with resolution in previous right hemipelvic collection and no leakage of enteric contrast - drain injection study 11/11 confirms fistulous connection - drain left in place. Stop flushing drain and placed to gravity  - reg diet and TPN, supplements  - restart calorie count today and extend through the weekend  - getting in enough nutritionally will be key to trying to heal fistula  -  continue daily dressing changes    FEN: PICC/TPN; reg diet  VTE: IVC Filter  ID: no current abx     ABL anemia secondary to post-op hematomas and hematuria  Acute PE and LLE DVT Pancreatic tail cyst Thyroid nodule  Chronic HFpEF Paroxysmal A. Fib Pre-diabetes RLS    Elizabeth Odom 10/28/2023

## 2023-10-28 NOTE — Plan of Care (Signed)

## 2023-10-28 NOTE — Progress Notes (Signed)
PHARMACY - TOTAL PARENTERAL NUTRITION CONSULT NOTE   Indication:  Prolonged ileus  Patient Measurements: Height: 5\' 5"  (165.1 cm) Weight: 62.2 kg (137 lb 2 oz) IBW/kg (Calculated) : 57 TPN AdjBW (KG): 62 Body mass index is 22.82 kg/m. Usual Weight: 64.5 on 10/04/2023  Assessment:  TPN per pharmacy consult for this 82 yo female admitted on 09/27/2023 with recurrent abdominal pain, nausea, emesis, diarrhea and intolerance of oral intake. Surgery completed exp lap with small bowel resection and Hartman's resection and colostomy on 10/01/2023.  Post-op ileus, 10/31 imaging showed anastomotic leak - drain placed by IR on 11/1 for abscess.  Glucose / Insulin: No hx DM. Goal CBG <180 - SSI discontinued - Glucose 111 Electrolytes: Na low/stable, CO2 mildly low.  Others WNL including CorrCa  (Labs 11/16) Renal: SCr <1, BUN 26 Hepatic: LFTs slightly elevated but stable, Alk Phos trending down (11/14) - Albumin remains low - TG 156 decreased (11/15), now only slightly elevated Intake / Output; MIVF: no mIVF -UOP: 2750 mL. Drain output: 0 mL, ostomy: 0 mL GI Imaging: 10/10 CT A/P: small bowel enteritis/colitis that was also involving the sigmoid colon. No bowel obstruction seen.  10/17 CT A/P: wall thickening sigmoid colon worrisome for neoplasm. Focal colitis not excluded. Colonic dilation compatible with obstruction.  Cystic mass at the tail of the pancreas.  10/18 MR - 9 mm cystic lesion within pancreatic body/tail junction, colonic obstruction 10/31 CT A/P - colostomy and Hartmann's pouch seen.  Appears to be extravasation of contrast suggesting perforation or leakage. 11/8 CT A/P: No persistent anastomotic leak or fistula. GI Surgeries / Procedures:  10/19 flexible sigmoidoscopy 10/21 colectomy with colostomy creation/Hartman procedure 11/1: CT guided visceral fluid drain by perc cath  Central access: PICC line placed 11/1 TPN start date: 11/1  Nutritional Goals: Due to the Baxter  IV fluid disruption, we will be switching all adult compounded parenteral nutrition to premade Clinimix products +/- fat emulsion infusion. Our goal will be to continue providing as close to 100% of our patient's nutritional needs. Due to the volatile availability of different Clinimix concentrations we may not be able to meet 100% of adult patient nutritional goals at this time.   TPN: Clinimix E 8/10, 1L x 5 days per week and 2L x 2 days per week with daily lipids ( ) to provide 103 g protein and an average of 1353 kcal per day D10 previously infusing in addition to Clinimix to provide additional kcal, however, discontinued 11/9 given hyponatremia  RD Assessment:  Estimated Needs Total Energy Estimated Needs: 1900-2100 Total Protein Estimated Needs: 90-105g Total Fluid Estimated Needs: 2L/day  Current Nutrition:  TPN Diet: Regular. Calorie count in process.  Magic cup TID (each provides 290 kcal, 9 g protein) Prosource Plus PO BID (each provides 100 kcal, 15 g protein)  Plan:  At 1800: Continue TPN with Clinimix E 8/10 + SMOF lipids Clinimix 2L on Monday, Thursday (82 mL/hr).  1L all other days of the week (41 mL/hr) Electrolytes in TPN (unable to adjust): Na 35 mEq/L, K 30 mEq/L, Ca 4.5 mEq/L, Mg 5 mEq/L, and Phos 15 mmol/L. Continue PO MVI No CBG checks/SSI  Monitor TPN labs on Mon/Thurs and as needed Follow up plans for advancing diet, calorie count, and ability to wean off of TPN.  72 hour calorie count through the weekend.     [name] PharmD, BCPS [facility] [phone] 10/28/2023 11:29 AM

## 2023-10-28 NOTE — Progress Notes (Signed)
PROGRESS NOTE    Elizabeth HAFEN  ZOX:[id number] DOB: Oct 03, 1941 DOA: 09/27/2023 PCP: [name], FNP   Brief Narrative: 82 year old with past medical history significant for A-fib, RLS, anemia presented to the ED 10/17 with recurrent abdominal pain, nausea emesis, diarrhea intolerance of oral intake.  She was evaluated in the ED 10/10 for abdominal pain, decreased appetite, fever, diagnosed with possible colitis noted on CT abdomen and pelvis discharged on Augmentin.  She had recurrence of symptoms and presented back to the hospital.  CT initially showed proximal sigmoid colon wall thickening with upstream colonic dilation with air-fluid levels and large amount of stool compatible with obstruction.  9 mm cystic lesion in the tail of the pancreas and subcentimeter lucent lesion in L1 vertebral body.  GI consulted, patient refused colonoscopy but agreed to sigmoidoscopy showing stricture versus mass at the rectosigmoid region.  Scope could not be advanced past this.  Surgery was consulted and she underwent a small bowel resection and Hartman's resection and  colostomy on 10/01/2023.  She continued to have postoperative ileus managed by general surgery.  NG remains in place to suction.  Hospital course also complicated by acute PE and left lower extremity DVT she was started on heparin.  She subsequently developed acute blood loss anemia and subsequent pelvic hematoma near the small bowel anastomosis as well as notable hematuria.  Urology was consulted.  Signed off after hematuria resolution.  Given high risk for bleeding with notable PE DVT evaluated by IR and placed IVC filter 10/05/2023.  Patient required total of 4 units of packed red blood cell.  As her bowel function did not return she underwent a repeat CT abdomen and pelvis on 10/31st showing anastomotic leak into 7 x 6 cm complex fluid collection posteriorly in the pelvis.  IR was consulted and underwent CT-guided percutaneous drain placement  into pelvic abscess.  Fluid grew MRSA.  ID consulted and once transitioned to daptomycin and Zosyn.  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  ID recommended no further need of antibiotics.  Awaiting his SNF currently   Assessment & Plan:   Principal Problem:   Large bowel obstruction (HCC) Active Problems:   Acute colitis   Left leg DVT (HCC)   ABLA (acute blood loss anemia)   Palliative care encounter   Counseling and coordination of care   Need for emotional support   Goals of care, counseling/discussion   High risk medication use   Pain   Medication management   Pressure injury of skin   Malnutrition of moderate degree  1-Large bowel obstruction Postop.  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  -Patient presented with abdominal pain.  CT abdomen 10/17 proximal sigmoid colon thickening -GI consulted underwent sigmoidoscopy 10/19 which showed tight stricture found to have a mass, biopsy was negative for malignancy as suggesting inflammation. -Status post exploratory laparotomy Hartman's resection 10/21st with a small bowel resection and ostomy by Dr. [name]. -CT abdomen and pelvis 10/31st show a small leak at her anastomosis going into 6 x 7 cm fluid collection. -Underwent drainage catheter placement of pelvic abscess by IR on 11/1 -Fluid grew MRSA, ID consulted.  She was treated with daptomycin and Zosyn. -Repeated CT abdomen and pelvis 11/7 show percutaneous pigtail drain catheter, no persistent anastomotic leak or fistula. -11/11 pelvic drain injection study showed fistulous communication with colon/staple line anastomosis.  Drain to remain in place. -Per ID  no further indication to continue antibiotics.  Antibiotics discontinued -Continue TPN, soft diet, surgery following -Plan to continue with  calori count over weekend.  -KUB 11/15 no obstruction.    Hyponatremia D10 was discontinued.  Continue to monitor sodium level   Acute blood loss anemia status post perianastomotic hematoma with hematuria Status post 4 units of packed red blood cell.  Status post IVC filter -Evaluated by urology no indication for cystoscopy or intervention Hb has remained stable   Acute PE ( small to moderate clot Burden) without right heart strain with acute left DVT Unable to tolerate anticoagulation and status post IVC filter placement 10/25th Correction, no evidece of right heart strain on CT scan CT chest. ECHO normal RV function.   Thyroid nodule in the inferior aspect of the isthmus Outpatient follow-up  Paroxysmal A-fib Controlled.  Not on anticoagulation  Hypokalemia hypophosphatemia hypomagnesemia Replace as needed  Urinary retention: continue with foley catheter.   Hypotension; resolved  Leukocytosis Resolved.   Pressure injury documentation Local care.   Moderate protein caloric malnutrition On TPN    See wound care documentation below  Pressure Injury 10/08/23 Buttocks Right;Mid Stage 2 -  Partial thickness loss of dermis presenting as a shallow open injury with a red, pink wound bed without slough. (Active)  10/08/23 0800  Location: Buttocks  Location Orientation: Right;Mid  Staging: Stage 2 -  Partial thickness loss of dermis presenting as a shallow open injury with a red, pink wound bed without slough.  Wound Description (Comments):   Present on Admission:   Dressing Type Foam - Lift dressing to assess site every shift 10/28/23 1314     Nutrition Problem: Moderate Malnutrition Etiology: chronic illness    Signs/Symptoms: moderate fat depletion, severe muscle depletion    Interventions: Refer to RD note for recommendations, TPN  Estimated body mass index is 21.37 kg/m as calculated from the following:   Height as of this encounter: 5\' 5"  (1.651 m).    Weight as of this encounter: 58.2 kg.   DVT prophylaxis: SCD Code Status: DNR Family Communication: care discussed with patient.  Disposition Plan:  Status is: Inpatient Remains inpatient appropriate because: management. Of SBP    Consultants:  Surgery Urology IR ID Procedures:  10/21 s/p ex lap hartman's resection , small bowel resection and ostomy.  IVC filter placement  Antimicrobials:   Subjective She is feeling comfortable. Her son came to visit her she is happy .  She report mild abdominal discomfort. She will try to eat some later.    Objective: Vitals:   10/27/23 1347 10/27/23 2021 10/28/23 0608 10/28/23 0753  BP: 125/69 134/80 106/68   Pulse: 66 70 66   Resp: 16 18 18    Temp: 97.9 F (36.6 C) 97.9 F (36.6 C) 98.6 F (37 C)   TempSrc:  Oral Oral   SpO2: 100% 98% 97%   Weight:    58.2 kg  Height:        Intake/Output Summary (Last 24 hours) at 10/28/2023 1438 Last data filed at 10/28/2023 0945 Gross per 24 hour  Intake 1547.51 ml  Output 2750 ml  Net -1202.49 ml   Filed Weights   10/23/23 0500 10/25/23 0713 10/28/23 0753  Weight: 62 kg 62.2 kg 58.2 kg    Examination:  General exam: NAD Respiratory system: CTA Cardiovascular system: S 1, S 2 RRR Gastrointestinal system:BS present, soft, midline incision wound cover, ostomy in place. Drain in place.  Central nervous system: Alert, follows command Extremities: No edema  Data Reviewed: I have personally reviewed following labs and imaging studies  CBC: Recent Labs  Lab 10/23/23 0312 10/24/23 0334 10/27/23 0312  WBC 7.7 6.9 7.0  HGB 10.1* 10.3* 10.0*  HCT 31.5* 32.1* 30.9*  MCV 96.3 95.8 96.0  PLT 270 265 219   Basic Metabolic Panel: Recent Labs  Lab 10/22/23 0354 10/23/23 1846 10/24/23 0334 10/25/23 0350 10/27/23 0312 10/28/23 1120  NA 128*  --  131* 131* 131* 132*  K 3.7 4.1 3.9 3.8 3.7 3.9  CL 100  --  104 103 104 103  CO2 22  --  21* 23 21* 23  GLUCOSE 121*  --  119*  115* 111* 123*  BUN 18  --  21 19 26* 21  CREATININE 0.65  --  0.44 0.55 0.46 0.44  CALCIUM 8.1*  --  8.3* 8.5* 8.6* 8.6*  MG 1.9 1.9 1.9 1.8 1.9  --   PHOS 2.9  --  2.8 3.2 3.3  --    GFR: Estimated Creatinine Clearance: 47.9 mL/min (by C-G formula based on SCr of 0.44 mg/dL). Liver Function Tests: Recent Labs  Lab 10/22/23 0354 10/25/23 0350  AST 48* 47*  ALT 59* 65*  ALKPHOS 205* 180*  BILITOT 0.6 0.5  PROT 6.2* 7.1  ALBUMIN 2.1* 2.1*   No results for input(s): "LIPASE", "AMYLASE" in the last 168 hours. No results for input(s): "AMMONIA" in the last 168 hours. Coagulation Profile: No results for input(s): "INR", "PROTIME" in the last 168 hours. Cardiac Enzymes: No results for input(s): "CKTOTAL", "CKMB", "CKMBINDEX", "TROPONINI" in the last 168 hours. BNP (last 3 results) No results for input(s): "PROBNP" in the last 8760 hours. HbA1C: No results for input(s): "HGBA1C" in the last 72 hours. CBG: Recent Labs  Lab 10/22/23 0829 10/22/23 1707 10/22/23 2352 10/23/23 0729 10/23/23 1632  GLUCAP 106* 112* 130* 132* 122*   Lipid Profile: Recent Labs    10/26/23 0451  TRIG 156*   Thyroid Function Tests: No results for input(s): "TSH", "T4TOTAL", "FREET4", "T3FREE", "THYROIDAB" in the last 72 hours. Anemia Panel: No results for input(s): "VITAMINB12", "FOLATE", "FERRITIN", "TIBC", "IRON", "RETICCTPCT" in the last 72 hours. Sepsis Labs: No results for input(s): "PROCALCITON", "LATICACIDVEN" in the last 168 hours.  No results found for this or any previous visit (from the past 240 hour(s)).       Radiology Studies: No results found.      Scheduled Meds:  (feeding supplement) PROSource Plus  30 mL Oral BID BM   Chlorhexidine Gluconate Cloth  6 each Topical Daily   docusate sodium  100 mg Oral BID   Gerhardt's butt cream   Topical BID   ketotifen  2 drop Both Eyes BID   multivitamin with minerals  1 tablet Oral Daily   polyethylene glycol  17 g Oral  Daily   sodium chloride flush  10-40 mL Intracatheter Q12H   Continuous Infusions:  .TPN (CLINIMIX-E) Adult 41 mL/hr (10/27/23 1822)   .TPN (CLINIMIX-E) Adult     And   fat emul(SMOFlipid)       LOS: 31 days    Time spent: 35 minutes    Nallely Yost A Marta Bouie, MD [facility]   If 7PM-7AM, please contact night-coverage [email or web address]  10/28/2023, 2:38 PM

## 2023-10-29 DIAGNOSIS — K56609 Unspecified intestinal obstruction, unspecified as to partial versus complete obstruction: Secondary | ICD-10-CM | POA: Diagnosis not present

## 2023-10-29 LAB — COMPREHENSIVE METABOLIC PANEL
ALT: 58 U/L — ABNORMAL HIGH (ref 0–44)
AST: 36 U/L (ref 15–41)
Albumin: 2.4 g/dL — ABNORMAL LOW (ref 3.5–5.0)
Alkaline Phosphatase: 152 U/L — ABNORMAL HIGH (ref 38–126)
Anion gap: 6 (ref 5–15)
BUN: 20 mg/dL (ref 8–23)
CO2: 23 mmol/L (ref 22–32)
Calcium: 8.5 mg/dL — ABNORMAL LOW (ref 8.9–10.3)
Chloride: 101 mmol/L (ref 98–111)
Creatinine, Ser: 0.48 mg/dL (ref 0.44–1.00)
GFR, Estimated: >60 mL/min (ref >60)
Glucose, Bld: 112 mg/dL — ABNORMAL HIGH (ref 70–99)
Potassium: 3.8 mmol/L (ref 3.5–5.1)
Sodium: 130 mmol/L — ABNORMAL LOW (ref 135–145)
Total Bilirubin: 0.4 mg/dL (ref <1.2)
Total Protein: 7.3 g/dL (ref 6.5–8.1)

## 2023-10-29 LAB — TRIGLYCERIDES: Triglycerides: 135 mg/dL (ref <150)

## 2023-10-29 LAB — MAGNESIUM: Magnesium: 1.9 mg/dL (ref 1.7–2.4)

## 2023-10-29 LAB — PHOSPHORUS: Phosphorus: 3 mg/dL (ref 2.5–4.6)

## 2023-10-29 MED ORDER — CLINIMIX E/DEXTROSE (8/10) 8 % IV SOLN
INTRAVENOUS | Status: AC
  Filled 2023-10-29 (×2): qty 2000

## 2023-10-29 MED ORDER — HYDROMORPHONE HCL 1 MG/ML IJ SOLN
0.5000 mg | Freq: Four times a day (QID) | INTRAMUSCULAR | Status: DC | PRN
  Administered 2023-10-29 – 2023-11-01 (×8): 0.5 mg via INTRAVENOUS
  Filled 2023-10-29 (×8): qty 0.5

## 2023-10-29 MED ORDER — FAT EMUL FISH OIL/PLANT BASED 20% (SMOFLIPID)IV EMUL
250.0000 mL | INTRAVENOUS | Status: AC
  Administered 2023-10-29: 250 mL via INTRAVENOUS
  Filled 2023-10-29: qty 250

## 2023-10-29 NOTE — Progress Notes (Signed)
PHARMACY - TOTAL PARENTERAL NUTRITION CONSULT NOTE   Indication:  Prolonged ileus  Patient Measurements: Height: 5\' 5"  (165.1 cm) Weight: 58.2 kg (128 lb 6.4 oz) IBW/kg (Calculated) : 57 TPN AdjBW (KG): 62 Body mass index is 21.37 kg/m. Usual Weight: 64.5 on 10/04/2023  Recent Labs    10/27/23 0312 10/28/23 1120 10/29/23 0327  NA 131* 132* 130*  K 3.7 3.9 3.8  CL 104 103 101  CO2 21* 23 23  GLUCOSE 111* 123* 112*  BUN 26* 21 20  CREATININE 0.46 0.44 0.48  CALCIUM 8.6* 8.6* 8.5*  PHOS 3.3  --  3.0  MG 1.9  --  1.9  ALBUMIN  --   --  2.4*  ALKPHOS  --   --  152*  AST  --   --  36  ALT  --   --  58*  BILITOT  --   --  0.4  TRIG  --   --  135     Assessment:  TPN per pharmacy consult for this 82 yo female admitted on 09/27/2023 with recurrent abdominal pain, nausea, emesis, diarrhea and intolerance of oral intake. Surgery completed exp lap with small bowel resection and Hartman's resection and colostomy on 10/01/2023.  Post-op ileus, 10/31 imaging showed anastomotic leak - drain placed by IR on 11/1 for abscess.  Glucose / Insulin: No hx DM. Goal CBG <180 - SSI discontinued - Glucose 112 Electrolytes: Na remains slightly, CO2 now WNL.  Others WNL including CorrCa   Renal: SCr < 1, BUN WNL Hepatic: LFTs slightly elevated but stable, Alk Phos improving - Albumin low but improving - TG improved to WNL Intake / Output; MIVF: no mIVF -UOP: 2400 mL. Drain output: 0 mL, ostomy:125 mL GI Imaging: 10/10 CT A/P: small bowel enteritis/colitis that was also involving the sigmoid colon. No bowel obstruction seen.  10/17 CT A/P: wall thickening sigmoid colon worrisome for neoplasm. Focal colitis not excluded. Colonic dilation compatible with obstruction.  Cystic mass at the tail of the pancreas.  10/18 MR - 9 mm cystic lesion within pancreatic body/tail junction, colonic obstruction 10/31 CT A/P - colostomy and Hartmann's pouch seen.  Appears to be extravasation of contrast  suggesting perforation or leakage. 11/8 CT A/P: No persistent anastomotic leak or fistula. GI Surgeries / Procedures:  10/19 flexible sigmoidoscopy 10/21 colectomy with colostomy creation/Hartman procedure 11/1: CT guided visceral fluid drain by perc cath  Central access: PICC line placed 11/1 TPN start date: 11/1  Nutritional Goals: Due to the Baxter IV fluid disruption, we will be switching all adult compounded parenteral nutrition to premade Clinimix products +/- fat emulsion infusion. Our goal will be to continue providing as close to 100% of our patient's nutritional needs. Due to the volatile availability of different Clinimix concentrations we may not be able to meet 100% of adult patient nutritional goals at this time.   TPN: Clinimix E 8/10, 1L x 5 days per week and 2L x 2 days per week with daily lipids ( ) to provide 103 g protein and an average of 1353 kcal per day D10 previously infusing in addition to Clinimix to provide additional kcal, however, discontinued 11/9 given hyponatremia  RD Assessment:  Estimated Needs Total Energy Estimated Needs: 1900-2100 Total Protein Estimated Needs: 90-105g Total Fluid Estimated Needs: 2L/day  Current Nutrition:  TPN Diet: Regular. Calorie count in progress.  Magic cup TID (each provides 290 kcal, 9 g protein) Prosource Plus PO BID (each provides 100 kcal, 15 g protein)  Plan:  At 1800: Continue TPN with Clinimix E 8/10 + SMOF lipids Clinimix 2L on Monday, Thursday (82 mL/hr).  1L all other days of the week (41 mL/hr) Electrolytes in TPN (unable to adjust): Na 35 mEq/L, K 30 mEq/L, Ca 4.5 mEq/L, Mg 5 mEq/L, and Phos 15 mmol/L. Continue PO MVI No CBG checks/SSI  Monitor TPN labs on Mon/Thurs and as needed Follow up plans for advancing diet, calorie count, and ability to wean off of TPN.   Elie Goody, PharmD, BCPS Clinical Pharmacist 10/29/2023  8:34 AM

## 2023-10-29 NOTE — Plan of Care (Signed)
  Problem: Coping: Goal: Ability to adjust to condition or change in health will improve Outcome: Progressing   Problem: Nutritional: Goal: Maintenance of adequate nutrition will improve Outcome: Progressing   Problem: Skin Integrity: Goal: Risk for impaired skin integrity will decrease Outcome: Progressing   Problem: Education: Goal: Knowledge of General Education information will improve Description: Including pain rating scale, medication(s)/side effects and non-pharmacologic comfort measures Outcome: Progressing   Problem: Nutrition: Goal: Adequate nutrition will be maintained Outcome: Progressing   Problem: Coping: Goal: Level of anxiety will decrease Outcome: Progressing   Problem: Pain Managment: Goal: General experience of comfort will improve Outcome: Progressing   Problem: Safety: Goal: Ability to remain free from injury will improve Outcome: Progressing   Problem: Skin Integrity: Goal: Risk for impaired skin integrity will decrease Outcome: Progressing

## 2023-10-29 NOTE — Progress Notes (Signed)
Physical Therapy Treatment Patient Details Name: Elizabeth Odom MRN: [id number] DOB: 1941/10/16 Today's Date: 10/29/2023   History of Present Illness Patient is a 82 year old female who presented on 10/17 with acute colitis. on10/21, patient underwent ex laparotomy with hartman's resection and colostomy and small bowel resection. PMH: a fib, RLS, syncope, cervical CA, cervical spine fusions, anemia    PT Comments  LOS 31 days Pt was seen in bed at beginning of treatment. Pt said that her stomach and butt were hurting. Pt performed supine to sit with max assist + 2 using the chuck pad to scoot to EOB. Pt demonstrated poor sitting posture and was sacral sitting. Pt performed sit to stand and step pivot transfer with max assist via "bear hug", blocking of feet, and blocking of knees. Pt was very fearful during session, exclaimed that she was going to fall upon standing. She was reassured that she will not fall. Pt positioned comfortably in recliner. LPT recommends SNF.   If plan is discharge home, recommend the following: A lot of help with walking and/or transfers;A lot of help with bathing/dressing/bathroom;Assistance with cooking/housework;Help with stairs or ramp for entrance;Two people to help with walking and/or transfers   Can travel by private vehicle     No  Equipment Recommendations  None recommended by PT    Recommendations for Other Services       Precautions / Restrictions Precautions Precautions: Fall Precaution Comments: R post. drain, colostomy, sacral wound and drain, abd  surgical wound Restrictions Weight Bearing Restrictions: No     Mobility  Bed Mobility Overal bed mobility: Needs Assistance Bed Mobility: Supine to Sit     Supine to sit: Max assist, Total assist, +2 for physical assistance, +2 for safety/equipment, HOB elevated     General bed mobility comments: Required max assistance + 2 to control trunk and LE into seated position. Used chuck pad to slide  to decrease shearing of sacral wound.    Transfers Overall transfer level: Needs assistance Equipment used:  ("Bear hug") Transfers: Sit to/from Stand, Bed to chair/wheelchair/BSC Sit to Stand: From elevated surface, +2 physical assistance, +2 safety/equipment, Max assist   Step pivot transfers: Max assist, +2 physical assistance, +2 safety/equipment       General transfer comment: very anxious about falling, bil feet required blocking due to feet slipping, knees require blocking once sit to stand was initiated.    Ambulation/Gait               General Gait Details: N/A   Stairs             Wheelchair Mobility     Tilt Bed    Modified Rankin (Stroke Patients Only)       Balance                                            Cognition Arousal: Alert Behavior During Therapy: WFL for tasks assessed/performed Overall Cognitive Status: Within Functional Limits for tasks assessed                                 General Comments: AxO x 3 tired, drowsy        Exercises      General Comments        Pertinent Vitals/Pain Pain Assessment Faces Pain  Scale: Hurts whole lot    Home Living                          Prior Function            PT Goals (current goals can now be found in the care plan section) Acute Rehab PT Goals 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[employer] PT Goal Formulation: With patient Time For Goal Achievement: 10/16/23 Progress towards PT goals: Progressing toward goals    Frequency    Min 1X/week      PT Plan      Co-evaluation              AM-PAC PT "6 Clicks" Mobility   Outcome Measure  Help needed turning from your back to your side while in a flat bed without using bedrails?: A Lot Help needed moving from lying on your back to sitting on the side of a flat bed without using bedrails?: A Lot   Help needed standing up from a chair using  your arms (e.g., wheelchair or bedside chair)?: A Lot Help needed to walk in hospital room?: Total Help needed climbing 3-5 steps with a railing? : Total 6 Click Score: 8    End of Session Equipment Utilized During Treatment: Gait belt Activity Tolerance: Patient limited by pain;Patient limited by fatigue Patient left: in chair;with call bell/phone within reach Nurse Communication: Mobility status PT Visit Diagnosis: Difficulty in walking, not elsewhere classified (R26.2);Muscle weakness (generalized) (M62.81);Unsteadiness on feet (R26.81);Pain     Time: 1610-9604 PT Time Calculation (min) (ACUTE ONLY): 15 min  Charges:    $Therapeutic Activity: 8-22 mins PT General Charges $$ ACUTE PT VISIT: 1 Visit                     Lazaro Arms, SPTA 10/29/2023, 12:26 PM

## 2023-10-29 NOTE — Progress Notes (Signed)
PROGRESS NOTE    Elizabeth Odom  IOE:[id number] DOB: Jun 18, 1941 DOA: 09/27/2023 PCP: [name], FNP   Brief Narrative: 82 year old with past medical history significant for A-fib, RLS, anemia presented to the ED 10/17 with recurrent abdominal pain, nausea emesis, diarrhea intolerance of oral intake.  She was evaluated in the ED 10/10 for abdominal pain, decreased appetite, fever, diagnosed with possible colitis noted on CT abdomen and pelvis discharged on Augmentin.  She had recurrence of symptoms and presented back to the hospital.  CT initially showed proximal sigmoid colon wall thickening with upstream colonic dilation with air-fluid levels and large amount of stool compatible with obstruction.  9 mm cystic lesion in the tail of the pancreas and subcentimeter lucent lesion in L1 vertebral body.  GI consulted, patient refused colonoscopy but agreed to sigmoidoscopy showing stricture versus mass at the rectosigmoid region.  Scope could not be advanced past this.  Surgery was consulted and she underwent a small bowel resection and Hartman's resection and  colostomy on 10/01/2023.  She continued to have postoperative ileus managed by general surgery.  NG remains in place to suction.  Hospital course also complicated by acute PE and left lower extremity DVT she was started on heparin.  She subsequently developed acute blood loss anemia and subsequent pelvic hematoma near the small bowel anastomosis as well as notable hematuria.  Urology was consulted.  Signed off after hematuria resolution.  Given high risk for bleeding with notable PE DVT evaluated by IR and placed IVC filter 10/05/2023.  Patient required total of 4 units of packed red blood cell.  As her bowel function did not return she underwent a repeat CT abdomen and pelvis on 10/31st showing anastomotic leak into 7 x 6 cm complex fluid collection posteriorly in the pelvis.  IR was consulted and underwent CT-guided percutaneous drain placement  into pelvic abscess.  Fluid grew MRSA.  ID consulted and once transitioned to daptomycin and Zosyn.  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  ID recommended no further need of antibiotics.   Needs to be wean off TPN to be able to go to SNF   Assessment & Plan:   Principal Problem:   Large bowel obstruction (HCC) Active Problems:   Acute colitis   Left leg DVT (HCC)   ABLA (acute blood loss anemia)   Palliative care encounter   Counseling and coordination of care   Need for emotional support   Goals of care, counseling/discussion   High risk medication use   Pain   Medication management   Pressure injury of skin   Malnutrition of moderate degree  1-Large bowel obstruction Postop.  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  -Patient presented with abdominal pain.  CT abdomen 10/17 proximal sigmoid colon thickening -GI consulted underwent sigmoidoscopy 10/19 which showed tight stricture found to have a mass, biopsy was negative for malignancy as suggesting inflammation. -Status post exploratory laparotomy Hartman's resection 10/21st with a small bowel resection and ostomy by Dr. [name]. -CT abdomen and pelvis 10/31st show a small leak at her anastomosis going into 6 x 7 cm fluid collection. -Underwent drainage catheter placement of pelvic abscess by IR on 11/1 -Fluid grew MRSA, ID consulted.  She was treated with daptomycin and Zosyn. -Repeated CT abdomen and pelvis 11/7 show percutaneous pigtail drain catheter, no persistent anastomotic leak or fistula. -11/11 pelvic  drain injection study showed fistulous communication with colon/staple line anastomosis.  Drain to remain in place. -Per ID  no further indication to continue antibiotics.  Antibiotics discontinued -Continue TPN, soft diet, surgery  following -Plan to continue with calori . Hope for weaning TPN this week.  -KUB 11/15 no obstruction.    Hyponatremia D10 was discontinued.  Continue to monitor sodium level   Acute blood loss anemia status post perianastomotic hematoma with hematuria Status post 4 units of packed red blood cell.  Status post IVC filter -Evaluated by urology no indication for cystoscopy or intervention Hb has remained stable   Acute PE ( small to moderate clot Burden) without right heart strain with acute left DVT Unable to tolerate anticoagulation and status post IVC filter placement 10/25th Correction, no evidece of right heart strain on CT scan CT chest. ECHO normal RV function.   Thyroid nodule in the inferior aspect of the isthmus Outpatient follow-up  Paroxysmal A-fib Controlled.  Not on anticoagulation  Hypokalemia hypophosphatemia hypomagnesemia Replace as needed  Urinary retention: continue with foley catheter.   Hypotension; resolved  Leukocytosis Resolved.   Pressure injury documentation Local care.   Moderate protein caloric malnutrition On TPN    See wound care documentation below  Pressure Injury 10/08/23 Buttocks Right;Mid Stage 2 -  Partial thickness loss of dermis presenting as a shallow open injury with a red, pink wound bed without slough. (Active)  10/08/23 0800  Location: Buttocks  Location Orientation: Right;Mid  Staging: Stage 2 -  Partial thickness loss of dermis presenting as a shallow open injury with a red, pink wound bed without slough.  Wound Description (Comments):   Present on Admission:   Dressing Type Foam - Lift dressing to assess site every shift 10/29/23 1016     Nutrition Problem: Moderate Malnutrition Etiology: chronic illness    Signs/Symptoms: moderate fat depletion, severe muscle depletion    Interventions: Refer to RD note for recommendations, TPN  Estimated body mass index is 21.37 kg/m as calculated from the following:    Height as of this encounter: 5\' 5"  (1.651 m).   Weight as of this encounter: 58.2 kg.   DVT prophylaxis: SCD Code Status: DNR Family Communication: care discussed with patient.  Disposition Plan:  Status is: Inpatient Remains inpatient appropriate because: management. Of SBP    Consultants:  Surgery Urology IR ID Procedures:  10/21 s/p ex lap hartman's resection , small bowel resection and ostomy.  IVC filter placement  Antimicrobials:   Subjective She is feeling ok, she is comfortable sitting recliner.  She is eating some. Caloric count not enough to stop TPN  Objective: Vitals:   10/28/23 1445 10/28/23 2202 10/29/23 0538 10/29/23 1358  BP: 135/71 126/75 (!) 107/39 126/70  Pulse: 67 72 73 67  Resp: 16 17 16 16   Temp: 97.7 F (36.5 C) 97.8 F (36.6 C) 97.7 F (36.5 C) 98.4 F (36.9 C)  TempSrc:  Oral Oral Oral  SpO2: 100% 98% 96% 96%  Weight:      Height:        Intake/Output Summary (Last 24 hours) at 10/29/2023 1402 Last data filed at 10/29/2023 1359 Gross per 24 hour  Intake 1787.37 ml  Output 2185 ml  Net -397.63 ml   Filed Weights   10/23/23 0500 10/25/23 0713 10/28/23 0753  Weight: 62 kg 62.2 kg 58.2 kg    Examination:  General exam: NAD Respiratory system: CTA Cardiovascular system: S 1, S  2 RRR Gastrointestinal system:BS present, soft,  midline incision wound cover, ostomy in place. Drain in place.  Central nervous system: Alert Extremities: No edema  Data Reviewed: I have personally reviewed following labs and imaging studies  CBC: Recent Labs  Lab 10/23/23 0312 10/24/23 0334 10/27/23 0312  WBC 7.7 6.9 7.0  HGB 10.1* 10.3* 10.0*  HCT 31.5* 32.1* 30.9*  MCV 96.3 95.8 96.0  PLT 270 265 219   Basic Metabolic Panel: Recent Labs  Lab 10/23/23 1846 10/24/23 0334 10/25/23 0350 10/27/23 0312 10/28/23 1120 10/29/23 0327  NA  --  131* 131* 131* 132* 130*  K 4.1 3.9 3.8 3.7 3.9 3.8  CL  --  104 103 104 103 101  CO2  --  21* 23  21* 23 23  GLUCOSE  --  119* 115* 111* 123* 112*  BUN  --  21 19 26* 21 20  CREATININE  --  0.44 0.55 0.46 0.44 0.48  CALCIUM  --  8.3* 8.5* 8.6* 8.6* 8.5*  MG 1.9 1.9 1.8 1.9  --  1.9  PHOS  --  2.8 3.2 3.3  --  3.0   GFR: Estimated Creatinine Clearance: 47.9 mL/min (by C-G formula based on SCr of 0.48 mg/dL). Liver Function Tests: Recent Labs  Lab 10/25/23 0350 10/29/23 0327  AST 47* 36  ALT 65* 58*  ALKPHOS 180* 152*  BILITOT 0.5 0.4  PROT 7.1 7.3  ALBUMIN 2.1* 2.4*   No results for input(s): "LIPASE", "AMYLASE" in the last 168 hours. No results for input(s): "AMMONIA" in the last 168 hours. Coagulation Profile: No results for input(s): "INR", "PROTIME" in the last 168 hours. Cardiac Enzymes: No results for input(s): "CKTOTAL", "CKMB", "CKMBINDEX", "TROPONINI" in the last 168 hours. BNP (last 3 results) No results for input(s): "PROBNP" in the last 8760 hours. HbA1C: No results for input(s): "HGBA1C" in the last 72 hours. CBG: Recent Labs  Lab 10/22/23 1707 10/22/23 2352 10/23/23 0729 10/23/23 1632  GLUCAP 112* 130* 132* 122*   Lipid Profile: Recent Labs    10/29/23 0327  TRIG 135   Thyroid Function Tests: No results for input(s): "TSH", "T4TOTAL", "FREET4", "T3FREE", "THYROIDAB" in the last 72 hours. Anemia Panel: No results for input(s): "VITAMINB12", "FOLATE", "FERRITIN", "TIBC", "IRON", "RETICCTPCT" in the last 72 hours. Sepsis Labs: No results for input(s): "PROCALCITON", "LATICACIDVEN" in the last 168 hours.  No results found for this or any previous visit (from the past 240 hour(s)).       Radiology Studies: No results found.      Scheduled Meds:  (feeding supplement) PROSource Plus  30 mL Oral BID BM   Chlorhexidine Gluconate Cloth  6 each Topical Daily   docusate sodium  100 mg Oral BID   Gerhardt's butt cream   Topical BID   ketotifen  2 drop Both Eyes BID   multivitamin with minerals  1 tablet Oral Daily   polyethylene glycol  17  g Oral Daily   sodium chloride flush  10-40 mL Intracatheter Q12H   Continuous Infusions:  .TPN (CLINIMIX-E) Adult 41 mL/hr at 10/28/23 1735   TPN (CLINIMIX-E) Adult     And   fat emul(SMOFlipid)       LOS: 32 days    Time spent: 35 minutes    Zohar Laing A Brooke Steinhilber, MD [facility]   If 7PM-7AM, please contact night-coverage [email or web address]  10/29/2023, 2:02 PM

## 2023-10-29 NOTE — TOC Progression Note (Signed)
Transition of Care [facility]) - Progression Note   Patient Details  Name: Elizabeth Odom MRN: [id number] Date of Birth: 16-Sep-1941  [facility]) CM/SW Contact  Ewing Schlein, LCSW Phone Number: 10/29/2023, 9:41 AM  Clinical Narrative: Patient's TPN was increased over the weekend, so patient will not be medically ready for SNF until she is completely weaned off TPN. Patient will likely need to be faxed out again for updated bed offers as the current bed offers were contingent on patient no longer needing TPN.  Expected Discharge Plan: Home w Home Health Services Barriers to Discharge: Continued Medical Work up  Expected Discharge Plan and Services In-house Referral: NA Discharge Planning Services: CM Consult Post Acute Care Choice: Home Health Living arrangements for the past 2 months: Apartment          DME Arranged: N/A DME Agency: NA HH Arranged: PT, RN HH Agency: [facility] Date HH Agency Contacted: 10/02/23 Time HH Agency Contacted: 1550 Representative spoke with at [facility] Agency: Morrie Sheldon  Social Determinants of Health (SDOH) Interventions SDOH Screenings   Food Insecurity: No Food Insecurity (09/28/2023)  Housing: Low Risk  (09/28/2023)  Transportation Needs: No Transportation Needs (09/28/2023)  Utilities: Not At Risk (09/28/2023)  Tobacco Use: Low Risk  (10/04/2023)   Readmission Risk Interventions    10/19/2023    6:13 PM  Readmission Risk Prevention Plan  Transportation Screening Complete  PCP or Specialist Appt within 5-7 Days Complete  Home Care Screening Complete  Medication Review (RN CM) Complete

## 2023-10-29 NOTE — Progress Notes (Addendum)
Referring Physician(s): Dr. Gwyndolyn Kaufman  Supervising Physician: Roanna Banning  Patient Status:  [facility] - In-pt  Chief Complaint:  Pelvic abscess  Subjective:  Patient is alert, mostly comfortable. Some tenderness to palpation at drain site. Afebrile.  Allergies: Elemental sulfur  Medications: Prior to Admission medications   Medication Sig Start Date End Date Taking? Authorizing Provider  amoxicillin-clavulanate (AUGMENTIN) 875-125 MG tablet Take 1 tablet by mouth every 12 (twelve) hours. 09/21/23  Yes Horton, Kristie M, DO  ondansetron (ZOFRAN-ODT) 4 MG disintegrating tablet Take 4 mg by mouth every 8 (eight) hours as needed for vomiting or nausea. 09/16/23  Yes [provider]  meclizine (ANTIVERT) 25 MG tablet Take 25 mg by mouth 3 (three) times daily. Patient not taking: Reported on 11/23/2020    [provider]  Omega-3 Fatty Acids (FISH OIL) 500 MG CAPS Take 1 capsule by mouth daily. Patient not taking: Reported on 09/27/2023    [provider]     Vital Signs: BP (!) 107/39 (BP Location: Right Arm)   Pulse 73   Temp 97.7 F (36.5 C) (Oral)   Resp 16   Ht 5\' 5"  (1.651 m)   Wt 128 lb 6.4 oz (58.2 kg)   SpO2 96%   BMI 21.37 kg/m   Physical Exam Constitutional:      General: She is not in acute distress. Pulmonary:     Effort: Pulmonary effort is normal. No respiratory distress.  Abdominal:     General: There is no distension.     Comments: Alert; rt TG/pelvic drain intact, insertion site mildly tender, OP minimal today. Drain not flushed due to fistula.  Skin:    General: Skin is warm and dry.  Neurological:     Mental Status: She is alert and oriented to person, place, and time.  Psychiatric:        Mood and Affect: Mood normal.        Behavior: Behavior normal.     Imaging: DG Abd Portable 1V  Result Date: 10/26/2023 CLINICAL DATA:  Acute generalized abdominal pain and nausea. EXAM: PORTABLE ABDOMEN - 1 VIEW COMPARISON:   October 08, 2023. FINDINGS: Percutaneous drainage catheter is noted in right lower quadrant. No abnormal bowel dilatation is noted. Contrast is noted in nondilated colon. IMPRESSION: No abnormal bowel dilatation. Percutaneous drainage catheter noted in right lower quadrant. Electronically Signed   By: Lupita Raider M.D.   On: 10/26/2023 15:26    Labs:  CBC: Recent Labs    10/20/23 0549 10/23/23 0312 10/24/23 0334 10/27/23 0312  WBC 8.6 7.7 6.9 7.0  HGB 10.4* 10.1* 10.3* 10.0*  HCT 31.9* 31.5* 32.1* 30.9*  PLT 312 270 265 219    COAGS: Recent Labs    09/20/23 1902 10/05/23 0247 10/11/23 1255 10/12/23 0655  INR 1.1 1.1 1.1 1.1  APTT  --  30  --   --     BMP: Recent Labs    10/25/23 0350 10/27/23 0312 10/28/23 1120 10/29/23 0327  NA 131* 131* 132* 130*  K 3.8 3.7 3.9 3.8  CL 103 104 103 101  CO2 23 21* 23 23  GLUCOSE 115* 111* 123* 112*  BUN 19 26* 21 20  CALCIUM 8.5* 8.6* 8.6* 8.5*  CREATININE 0.55 0.46 0.44 0.48  GFRNONAA >60 >60 >60 >60    LIVER FUNCTION TESTS: Recent Labs    10/18/23 0533 10/22/23 0354 10/25/23 0350 10/29/23 0327  BILITOT 0.7 0.6 0.5 0.4  AST 45* 48* 47*  36  ALT 49* 59* 65* 58*  ALKPHOS 148* 205* 180* 152*  PROT 6.1* 6.2* 7.1 7.3  ALBUMIN 2.2* 2.1* 2.1* 2.4*    Assessment and Plan: 82 yo female with hx PE/LLE DVT, PAF, 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 CCS notified. Continue drain. Discontinue drain flushes.  Drain Location: RTG Size: Fr size: 12 Fr Date of placement: 11/1  Currently to: Drain collection device: gravity 24 hour output:  Output by Drain (mL) 10/27/23 0701 - 10/27/23 1900 10/27/23 1901 - 10/28/23 0700 10/28/23 0701 - 10/28/23 1900 10/28/23 1901 - 10/29/23 0700  10/29/23 0701 - 10/29/23 1102  Closed System Drain 1 Right Buttock  0 5 5     Interval imaging/drain manipulation:  None   Current examination: Insertion site unremarkable. Suture and stat lock in place. Dressed appropriately.   Plan: Do not flush drain. Record output Q shift. Dressing changes QD or PRN if soiled.  Call IR APP or on call IR MD if difficulty sudden change in drain output.  Repeat imaging/possible drain injection in 2 weeks from initial study on 11/11.  Consideration for drain removal if output is < 10 mL/QD (excluding flush material), pending discussion with the providing surgical service.  Discharge planning: Please contact IR APP or on call IR MD prior to patient d/c to ensure appropriate follow up plans are in place. Typically patient will follow up with IR clinic 10-14 days post d/c for repeat imaging/possible drain injection. IR scheduler will contact patient with date/time of appointment. Patient will possibly need to flush drain QD with 5 cc NS, record output QD, dressing changes every 2-3 days or earlier if soiled.   IR will continue to follow - please call with questions or concerns.    Electronically Signed: Sable Feil, PA-C 10/29/2023, 10:40 AM   I spent a total of 15 Minutes at the the patient's bedside AND on the patient's hospital floor or unit, greater than 50% of which was counseling/coordinating care for pelvic abscess drain

## 2023-10-29 NOTE — Progress Notes (Signed)
Patient has made progress today, sitting up in the chair for several hours. She did decline to ambulate in the hallway this morning. PT and staff have worked with patient and she is in better spirits. Taking in meals and excited about ordering trays. Haydee Salter, RN 10/29/23 5:24 PM

## 2023-10-29 NOTE — Progress Notes (Signed)
28 Days Post-Op   Subjective/Chief Complaint: Uncomfortable due to a raw area on her right buttock.   Objective: Vital signs in last 24 hours: Temp:  [97.7 F (36.5 C)-97.8 F (36.6 C)] 97.7 F (36.5 C) (11/18 0538) Pulse Rate:  [67-73] 73 (11/18 0538) Resp:  [16-17] 16 (11/18 0538) BP: (107-135)/(39-75) 107/39 (11/18 0538) SpO2:  [96 %-100 %] 96 % (11/18 0538) Last BM Date : 10/29/23  Intake/Output from previous day: 11/17 0701 - 11/18 0700 In: 1887.4 [P.O.:540; I.V.:1347.4] Out: 2035 [Urine:1800; Drains:10; Stool:225] Intake/Output this shift: No intake/output data recorded.  Alert, calm Unlabored respirations Abdomen is soft, nondistended, appropriately tender.  Midline wound is granulating and shallow without any unusual drainage.  Ostomy with dark liquid output.  Drain output with thin brown output; volume very low 10 cc / 24 hours  Lab Results:  Recent Labs    10/27/23 0312  WBC 7.0  HGB 10.0*  HCT 30.9*  PLT 219   BMET Recent Labs    10/28/23 1120 10/29/23 0327  NA 132* 130*  K 3.9 3.8  CL 103 101  CO2 23 23  GLUCOSE 123* 112*  BUN 21 20  CREATININE 0.44 0.48  CALCIUM 8.6* 8.5*   PT/INR No results for input(s): "LABPROT", "INR" in the last 72 hours. ABG No results for input(s): "PHART", "HCO3" in the last 72 hours.  Invalid input(s): "PCO2", "PO2"  Studies/Results: No results found.  Anti-infectives: Anti-infectives (From admission, onward)    Start     Dose/Rate Route Frequency Ordered Stop   10/15/23 1800  vancomycin (VANCOCIN) IVPB 1000 mg/200 mL premix  Status:  Discontinued        1,000 mg 200 mL/hr over 60 Minutes Intravenous Every 24 hours 10/14/23 1740 10/15/23 1045   10/15/23 1430  piperacillin-tazobactam (ZOSYN) IVPB 3.375 g        3.375 g 100 mL/hr over 30 Minutes Intravenous  Once 10/15/23 1343 10/15/23 1503   10/15/23 1400  DAPTOmycin (CUBICIN) IVPB 500 mg/40mL premix  Status:  Discontinued        8 mg/kg  64.1 kg 100  mL/hr over 30 Minutes Intravenous Daily 10/15/23 1045 10/22/23 1321   10/14/23 1800  vancomycin (VANCOREADY) IVPB 1250 mg/250 mL        1,250 mg 166.7 mL/hr over 90 Minutes Intravenous  Once 10/14/23 1740 10/14/23 2255   10/14/23 1400  piperacillin-tazobactam (ZOSYN) IVPB 3.375 g  Status:  Discontinued        3.375 g 12.5 mL/hr over 240 Minutes Intravenous Every 8 hours 10/14/23 0845 10/22/23 1321   10/14/23 0900  piperacillin-tazobactam (ZOSYN) IVPB 3.375 g        3.375 g 100 mL/hr over 30 Minutes Intravenous NOW 10/14/23 0844 10/14/23 1002   10/05/23 1200  piperacillin-tazobactam (ZOSYN) IVPB 3.375 g        3.375 g 12.5 mL/hr over 240 Minutes Intravenous Every 8 hours 10/05/23 0721 10/09/23 1716   10/04/23 2000  piperacillin-tazobactam (ZOSYN) IVPB 3.375 g  Status:  Discontinued        3.375 g 12.5 mL/hr over 240 Minutes Intravenous Every 8 hours 10/04/23 1827 10/05/23 0721   09/27/23 2315  piperacillin-tazobactam (ZOSYN) IVPB 3.375 g  Status:  Discontinued        3.375 g 12.5 mL/hr over 240 Minutes Intravenous Every 8 hours 09/27/23 2305 10/02/23 1106       Assessment/Plan: POD 27 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 [name] - CT 10/24 with hematoma  in the pelvis adjacent to small bowel anastomosis and ileus vs obstruction  - IR 11/1: drain placement for small bowel anastomotic leak/ abscess drainage - CT 11/8 with resolution in previous right hemipelvic collection and no leakage of enteric contrast - drain injection study 11/11 confirms fistulous connection - drain left in place. Stop flushing drain and placed to gravity  - reg diet and TPN, supplements  -Follow-up calorie count, can potentially wean TPN this week - getting in enough nutritionally will be key to trying to heal fistula which fortunately appears to be low output at this point - continue daily dressing changes  -Needs mobilization and physical conditioning   FEN: PICC/TPN;  reg diet  VTE: IVC Filter  ID: no current abx     ABL anemia secondary to post-op hematomas and hematuria  Acute PE and LLE DVT Pancreatic tail cyst Thyroid nodule  Chronic HFpEF Paroxysmal A. Fib Pre-diabetes RLS    Elizabeth Odom 10/29/2023

## 2023-10-29 NOTE — Progress Notes (Addendum)
Nutrition Follow-up  DOCUMENTATION CODES:   Non-severe (moderate) malnutrition in context of chronic illness  INTERVENTION:  - Regular diet.  - Mighty Shake BID with meals, each supplement provides 220 kcals and 6 grams of protein  - Patient has tried Ensure, Boost Plus, Boost Breeze, [employer], and ProSource Plus and disliked them all.   - Would recommend continuing TPN at this time due to ongoing inadequate intake.              - TPN management per pharmacy.    - Daily weights while on TPN.    NUTRITION DIAGNOSIS:   Moderate Malnutrition related to chronic illness as evidenced by moderate fat depletion, severe muscle depletion. *ongoing  GOAL:   Patient will meet greater than or equal to 90% of their needs *progressing, on TPN and oral diet  MONITOR:   PO intake, Supplement acceptance, Diet advancement, Labs, Weight trends, I & O's  REASON FOR ASSESSMENT:   Consult New TPN/TNA  ASSESSMENT:   82 year old F with PMH of A-fib, RLS and anemia presented to ED on 10/17 with recurrent abdominal pain, nausea, emesis, diarrhea and intolerance of oral intake.  Patient was seen in ED on 10/10 for abdominal pain, decreased appetite and fever and discharged on Augmentin for possible colitis noted on CT abdomen and pelvis.  10/18: admitted 10/19: s/p flex sigmoidoscopy 10/21: s/p colectomy w/ colostomy 10/23: NPO 10/31: CLD -> NPO 11/1: TPN started 11/5: CLD 11/8: FLD 11/11: Soft diet 11/12: Calorie count started 11/13 Regular diet 11/14 Calorie count complete: meeting 23% kcal needs and 15% protein needs 11/15 2nd calorie count started   11/15: Breakfast: 5% Lunch: 25% Dinner: 0% Supplements: 1 ProSource Plus Total intake: 612 kcal (32% of minimum estimated needs)  33 protein (37% of minimum estimated needs)  11/16: Breakfast: 0% Lunch: nothing documented Dinner: 50% Supplements: 1 ProSource Plus, 50% Magic Cup Total intake: 435 kcal (23% of minimum  estimated needs)  33 protein (37% of minimum estimated needs)  11/17: Breakfast: 75% Lunch: [employer] brought in by family (3 nuggets and couple bites of mac and cheese) Dinner: None documented Supplements: None Total intake: 441 kcal (23% of minimum estimated needs)  12 protein (13% of minimum estimated needs)  On average, she is getting 496 kcals (26% of kcal needs) and 26g protein (29% of needs).   Patient reports she has been trying to eat more but is challenged by ongoing decreased appetite. She seems to be somewhat picky as well.  Stressed importance of trying to eat 3 meals and working on increasing intake each day to be able to come off TPN. She has now tried multiple supplements and not enjoyed them. Hesitantly agreeable to try Mighty Shake.    Discussed results with Hospitalist and Surgery. Given poor results would recommend continuing TPN at this time. Patient has fistula that needs healing.  TPN per pharmacy: Clinimix 8/10, 1L x 5 days per week and 2L x 2 days per week with daily lipids ( ) to provide 103 g protein (100% of needs) and an average of 1353 kcal (71% of needs) per day    Admit weight: 124# Current weight: 128#  I&O's: +4L  Medications reviewed and include: Colace, Miralax, MVI  Labs reviewed:  Na 130 HA1C 5.8 Triglycerides 135 (as of 11/18)   Diet Order:   Diet Order             Diet regular Room service appropriate? Yes; Fluid consistency: Thin  Diet effective now  EDUCATION NEEDS:  Education needs have been addressed  Skin:  Skin Assessment: Skin Integrity Issues: Skin Integrity Issues:: Stage II, Incisions Stage II: mid sacrum Incisions: 10/21 abdomen  Last BM:  11/18 - colostomy  Height:  Ht Readings from Last 1 Encounters:  10/04/23 5\' 5"  (1.651 m)   Weight:  Wt Readings from Last 1 Encounters:  10/28/23 58.2 kg   BMI:  Body mass index is 21.37 kg/m.  Estimated Nutritional Needs:  Kcal:   1900-2100 Protein:  90-105g Fluid:  2L/day    Shelle Iron RD, LDN For contact information, refer to [facility].

## 2023-10-29 NOTE — Plan of Care (Signed)
Problem: Education: Goal: Ability to describe self-care measures that may prevent or decrease complications (Diabetes Survival Skills Education) will improve 10/29/2023 1042 by Artha Stavros, Stevphen Meuse, RN Outcome: Progressing  Problem: Coping: Goal: Ability to adjust to condition or change in health will improve 10/29/2023 1042 by Yvetta Drotar, Stevphen Meuse, RN Outcome: Progressing  Problem: Fluid Volume: Goal: Ability to maintain a balanced intake and output will improve 10/29/2023 1042 by Ajay Strubel, Stevphen Meuse, RN Outcome: Progressing  Problem: Nutritional: Goal: Maintenance of adequate nutrition will improve 10/29/2023 1042 by Abbigail Anstey, Stevphen Meuse, RN Outcome: Progressing  Problem: Nutrition: Goal: Adequate nutrition will be maintained 10/29/2023 1042 by Haydee Salter, RN Outcome: Progressing  Haydee Salter, RN 10/29/23 10:43 AM

## 2023-10-30 DIAGNOSIS — K56609 Unspecified intestinal obstruction, unspecified as to partial versus complete obstruction: Secondary | ICD-10-CM | POA: Diagnosis not present

## 2023-10-30 MED ORDER — CLINIMIX E/DEXTROSE (8/10) 8 % IV SOLN
INTRAVENOUS | Status: AC
  Filled 2023-10-30 (×2): qty 1000

## 2023-10-30 MED ORDER — HYDROMORPHONE HCL 1 MG/ML IJ SOLN
0.5000 mg | Freq: Once | INTRAMUSCULAR | Status: AC | PRN
  Administered 2023-10-30: 0.5 mg via INTRAVENOUS
  Filled 2023-10-30: qty 0.5

## 2023-10-30 MED ORDER — FAT EMUL FISH OIL/PLANT BASED 20% (SMOFLIPID)IV EMUL
250.0000 mL | INTRAVENOUS | Status: AC
Start: 2023-10-30 — End: 2023-10-31
  Administered 2023-10-30: 250 mL via INTRAVENOUS
  Filled 2023-10-30: qty 250

## 2023-10-30 NOTE — Progress Notes (Signed)
Referring Physician(s): Dr. Gwyndolyn Kaufman   Supervising Physician: Marliss Coots  Patient Status:  [facility] - In-pt  Chief Complaint:  Pelvic abscess   Subjective:  Patient was moved to a new room. She is alert, though somewhat uncomfortable. Mild and constant discomfort with laying on drain site. Pt is unable to rotate side to side unassisted. Some tenderness to palpation at drain site. Afebrile. Patient reports good output on drain.  Allergies: Elemental sulfur  Medications: Prior to Admission medications   Medication Sig Start Date End Date Taking? Authorizing Provider  amoxicillin-clavulanate (AUGMENTIN) 875-125 MG tablet Take 1 tablet by mouth every 12 (twelve) hours. 09/21/23  Yes Horton, Kristie M, DO  ondansetron (ZOFRAN-ODT) 4 MG disintegrating tablet Take 4 mg by mouth every 8 (eight) hours as needed for vomiting or nausea. 09/16/23  Yes [provider]  meclizine (ANTIVERT) 25 MG tablet Take 25 mg by mouth 3 (three) times daily. Patient not taking: Reported on 11/23/2020    [provider]  Omega-3 Fatty Acids (FISH OIL) 500 MG CAPS Take 1 capsule by mouth daily. Patient not taking: Reported on 09/27/2023    [provider]     Vital Signs: BP 122/67 (BP Location: Right Arm)   Pulse 68   Temp 97.9 F (36.6 C) (Oral)   Resp 18   Ht 5\' 5"  (1.651 m)   Wt 128 lb 6.4 oz (58.2 kg)   SpO2 96%   BMI 21.37 kg/m   Physical Exam Abdominal:     Tenderness: There is abdominal tenderness in the left lower quadrant.     Comments: Alert; rt TG/pelvic drain c/d/i.  Insertion site mildly tender, OP is brown. Drain not flushed due to fistula.   Neurological:     Mental Status: She is alert.     Imaging: DG Abd Portable 1V  Result Date: 10/26/2023 CLINICAL DATA:  Acute generalized abdominal pain and nausea. EXAM: PORTABLE ABDOMEN - 1 VIEW COMPARISON:  October 08, 2023. FINDINGS: Percutaneous drainage catheter is noted in right lower quadrant. No  abnormal bowel dilatation is noted. Contrast is noted in nondilated colon. IMPRESSION: No abnormal bowel dilatation. Percutaneous drainage catheter noted in right lower quadrant. Electronically Signed   By: Lupita Raider M.D.   On: 10/26/2023 15:26    Labs:  CBC: Recent Labs    10/20/23 0549 10/23/23 0312 10/24/23 0334 10/27/23 0312  WBC 8.6 7.7 6.9 7.0  HGB 10.4* 10.1* 10.3* 10.0*  HCT 31.9* 31.5* 32.1* 30.9*  PLT 312 270 265 219    COAGS: Recent Labs    09/20/23 1902 10/05/23 0247 10/11/23 1255 10/12/23 0655  INR 1.1 1.1 1.1 1.1  APTT  --  30  --   --     BMP: Recent Labs    10/25/23 0350 10/27/23 0312 10/28/23 1120 10/29/23 0327  NA 131* 131* 132* 130*  K 3.8 3.7 3.9 3.8  CL 103 104 103 101  CO2 23 21* 23 23  GLUCOSE 115* 111* 123* 112*  BUN 19 26* 21 20  CALCIUM 8.5* 8.6* 8.6* 8.5*  CREATININE 0.55 0.46 0.44 0.48  GFRNONAA >60 >60 >60 >60    LIVER FUNCTION TESTS: Recent Labs    10/18/23 0533 10/22/23 0354 10/25/23 0350 10/29/23 0327  BILITOT 0.7 0.6 0.5 0.4  AST 45* 48* 47* 36  ALT 49* 59* 65* 58*  ALKPHOS 148* 205* 180* 152*  PROT 6.1* 6.2* 7.1 7.3  ALBUMIN 2.2* 2.1* 2.1* 2.4*    Assessment  and Plan:  82 yo female with hx PE/LLE DVT, PAF, 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 CCS notified. Continue drain. Discontinue drain flushes.   Drain Location: RTG Size: Fr size: 12 Fr Date of placement: 11/1  Currently to: Drain collection device: gravity 24 hour output:  Output by Drain (mL) 10/28/23 0701 - 10/27/23 1900 10/28/23 1901 - 10/28/23 0700 10/29/23 0701 - 10/28/23 1900 10/29/23 1901 - 10/29/23 0700 10/30/23 0701 - 10/29/23 1102  Closed System Drain 1 Right Buttock  5 5 5  0         Interval imaging/drain manipulation:  None     Current examination: Insertion site unremarkable. Suture and stat lock in place. Dressed appropriately.    Plan: Do not flush drain. Record output Q shift. Dressing changes QD or PRN if soiled.  Call IR APP or on call IR MD if difficulty sudden change in drain output.  Repeat imaging/possible drain injection in 2 weeks from initial study on 11/11.  Consideration for drain removal if output is < 10 mL/QD (excluding flush material), pending discussion with the providing surgical service.   Discharge planning: Please contact IR APP or on call IR MD prior to patient d/c to ensure appropriate follow up plans are in place. Typically patient will follow up with IR clinic 10-14 days post d/c for repeat imaging/possible drain injection. IR scheduler will contact patient with date/time of appointment. Patient will possibly need to flush drain QD with 5 cc NS, record output QD, dressing changes every 2-3 days or earlier if soiled.    IR will continue to follow - please call with questions or concerns.    Electronically Signed: Sable Feil, PA-C 10/30/2023, 8:56 AM   I spent a total of 15 Minutes at the the patient's bedside AND on the patient's hospital floor or unit, greater than 50% of which was counseling/coordinating care for pelvic abscess drain

## 2023-10-30 NOTE — Consult Note (Signed)
WOC team in to visit. Patient has thus far not been willing to do any care of ostomy.  Two piece 2 1/4" pouch intact at this time, patient unsure of when placed and not dated.   I asked patient if we could change pouch and do some education on ostomy care which she deferred stating she had "just gotten comfy".    Per TOC notes the plan for discharge is SNF after weaning off TPN. If plans change and patient goes home she would have to agree to education on at least emptying her ostomy, even if goes home with home health.  WOC team would be happy to educate any family member who would be staying with patient. Patient has stated she lives alone on prior visits.    For now WOC team will continue to follow weekly for ostomy support and education.    Thank you,    Priscella Mann MSN, RN-BC, [employer] [phone]

## 2023-10-30 NOTE — Progress Notes (Signed)
PROGRESS NOTE    Elizabeth Odom  ZDG:[id number] DOB: 1941/08/16 DOA: 09/27/2023 PCP: [name], FNP   Brief Narrative: 82 year old with past medical history significant for A-fib, RLS, anemia presented to the ED 10/17 with recurrent abdominal pain, nausea emesis, diarrhea intolerance of oral intake.  She was evaluated in the ED 10/10 for abdominal pain, decreased appetite, fever, diagnosed with possible colitis noted on CT abdomen and pelvis discharged on Augmentin.  She had recurrence of symptoms and presented back to the hospital.  CT initially showed proximal sigmoid colon wall thickening with upstream colonic dilation with air-fluid levels and large amount of stool compatible with obstruction.  9 mm cystic lesion in the tail of the pancreas and subcentimeter lucent lesion in L1 vertebral body.  GI consulted, patient refused colonoscopy but agreed to sigmoidoscopy showing stricture versus mass at the rectosigmoid region.  Scope could not be advanced past this.  Surgery was consulted and she underwent a small bowel resection and Hartman's resection and  colostomy on 10/01/2023.  She continued to have postoperative ileus managed by general surgery.  NG remains in place to suction.  Hospital course also complicated by acute PE and left lower extremity DVT she was started on heparin.  She subsequently developed acute blood loss anemia and subsequent pelvic hematoma near the small bowel anastomosis as well as notable hematuria.  Urology was consulted.  Signed off after hematuria resolution.  Given high risk for bleeding with notable PE DVT evaluated by IR and placed IVC filter 10/05/2023.  Patient required total of 4 units of packed red blood cell.  As her bowel function did not return she underwent a repeat CT abdomen and pelvis on 10/31st showing anastomotic leak into 7 x 6 cm complex fluid collection posteriorly in the pelvis.  IR was consulted and underwent CT-guided percutaneous drain placement  into pelvic abscess.  Fluid grew MRSA.  ID consulted and once transitioned to daptomycin and Zosyn.  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  ID recommended no further need of antibiotics.   Needs to be wean off TPN to be able to go to SNF   Assessment & Plan:   Principal Problem:   Large bowel obstruction (HCC) Active Problems:   Acute colitis   Left leg DVT (HCC)   ABLA (acute blood loss anemia)   Palliative care encounter   Counseling and coordination of care   Need for emotional support   Goals of care, counseling/discussion   High risk medication use   Pain   Medication management   Pressure injury of skin   Malnutrition of moderate degree  1-Large bowel obstruction Postop.  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  -Patient presented with abdominal pain.  CT abdomen 10/17 proximal sigmoid colon thickening -GI consulted underwent sigmoidoscopy 10/19 which showed tight stricture found to have a mass, biopsy was negative for malignancy as suggesting inflammation. -Status post exploratory laparotomy Hartman's resection 10/21st with a small bowel resection and ostomy by Dr. [name]. -CT abdomen and pelvis 10/31st show a small leak at her anastomosis going into 6 x 7 cm fluid collection. -Underwent drainage catheter placement of pelvic abscess by IR on 11/1 -Fluid grew MRSA, ID consulted.  She was treated with daptomycin and Zosyn. -Repeated CT abdomen and pelvis 11/7 show percutaneous pigtail drain catheter, no persistent anastomotic leak or fistula. -11/11 pelvic  drain injection study showed fistulous communication with colon/staple line anastomosis.  Drain to remain in place. -Per ID  no further indication to continue antibiotics.  Antibiotics discontinued -Continue TPN, soft diet, surgery  following -Plan to continue with calori . Hope for weaning TPN this week.  -KUB 11/15 no obstruction.  Management per general Sx  Hyponatremia D10 was discontinued.  Continue to monitor sodium level   Acute blood loss anemia status post perianastomotic hematoma with hematuria Status post 4 units of packed red blood cell.  Status post IVC filter -Evaluated by urology no indication for cystoscopy or intervention Hb has remained stable   Acute PE ( small to moderate clot Burden) without right heart strain with acute left DVT Unable to tolerate anticoagulation and status post IVC filter placement 10/25th Correction, no evidece of right heart strain on CT scan CT chest. ECHO normal RV function.   Thyroid nodule in the inferior aspect of the isthmus Outpatient follow-up  Paroxysmal A-fib Controlled.  Not on anticoagulation  Hypokalemia hypophosphatemia hypomagnesemia Replace as needed  Urinary retention: continue with foley catheter.   Hypotension; resolved  Leukocytosis Resolved.   Pressure injury documentation Local care.   Moderate protein caloric malnutrition On TPN    See wound care documentation below  Pressure Injury 10/08/23 Buttocks Right;Mid Stage 2 -  Partial thickness loss of dermis presenting as a shallow open injury with a red, pink wound bed without slough. (Active)  10/08/23 0800  Location: Buttocks  Location Orientation: Right;Mid  Staging: Stage 2 -  Partial thickness loss of dermis presenting as a shallow open injury with a red, pink wound bed without slough.  Wound Description (Comments):   Present on Admission:   Dressing Type Foam - Lift dressing to assess site every shift 10/29/23 2105     Nutrition Problem: Moderate Malnutrition Etiology: chronic illness    Signs/Symptoms: moderate fat depletion, severe muscle depletion    Interventions: Refer to RD note for recommendations, TPN  Estimated body mass index is 21.37 kg/m as  calculated from the following:   Height as of this encounter: 5\' 5"  (1.651 m).   Weight as of this encounter: 58.2 kg.   DVT prophylaxis: SCD Code Status: DNR Family Communication: care discussed with patient.  Disposition Plan:  Status is: Inpatient Remains inpatient appropriate because: management. Of SBP    Consultants:  Surgery Urology IR ID Procedures:  10/21 s/p ex lap hartman's resection , small bowel resection and ostomy.  IVC filter placement  Antimicrobials:   Subjective No New complaints, she will order lunch scramble eggs with toast.   Objective: Vitals:   10/29/23 1826 10/29/23 1947 10/30/23 0605 10/30/23 1138  BP: 129/71 (!) 143/76 122/67 124/69  Pulse: 72 68 68 66  Resp: 18 18 18 16   Temp: 98.4 F (36.9 C) 98.3 F (36.8 C) 97.9 F (36.6 C) 98.1 F (36.7 C)  TempSrc: Oral Oral Oral Oral  SpO2: 100% 99% 96% 98%  Weight:      Height:        Intake/Output Summary (Last 24 hours) at 10/30/2023 1420 Last data filed at 10/30/2023 1000 Gross per 24 hour  Intake 2064.53 ml  Output 1545 ml  Net 519.53 ml   Filed Weights   10/23/23 0500 10/25/23 0713 10/28/23 0753  Weight: 62 kg 62.2 kg 58.2 kg    Examination:  General exam: NAD Respiratory system: CTA Cardiovascular system: S 1, S  2 RRR Gastrointestinal system:BS present, soft, midline incision wound cover, ostomy in  place. Drain in place. Alert, follows command Extremities: No edema  Data Reviewed: I have personally reviewed following labs and imaging studies  CBC: Recent Labs  Lab 10/24/23 0334 10/27/23 0312  WBC 6.9 7.0  HGB 10.3* 10.0*  HCT 32.1* 30.9*  MCV 95.8 96.0  PLT 265 219   Basic Metabolic Panel: Recent Labs  Lab 10/23/23 1846 10/24/23 0334 10/25/23 0350 10/27/23 0312 10/28/23 1120 10/29/23 0327  NA  --  131* 131* 131* 132* 130*  K 4.1 3.9 3.8 3.7 3.9 3.8  CL  --  104 103 104 103 101  CO2  --  21* 23 21* 23 23  GLUCOSE  --  119* 115* 111* 123* 112*  BUN  --   21 19 26* 21 20  CREATININE  --  0.44 0.55 0.46 0.44 0.48  CALCIUM  --  8.3* 8.5* 8.6* 8.6* 8.5*  MG 1.9 1.9 1.8 1.9  --  1.9  PHOS  --  2.8 3.2 3.3  --  3.0   GFR: Estimated Creatinine Clearance: 47.9 mL/min (by C-G formula based on SCr of 0.48 mg/dL). Liver Function Tests: Recent Labs  Lab 10/25/23 0350 10/29/23 0327  AST 47* 36  ALT 65* 58*  ALKPHOS 180* 152*  BILITOT 0.5 0.4  PROT 7.1 7.3  ALBUMIN 2.1* 2.4*   No results for input(s): "LIPASE", "AMYLASE" in the last 168 hours. No results for input(s): "AMMONIA" in the last 168 hours. Coagulation Profile: No results for input(s): "INR", "PROTIME" in the last 168 hours. Cardiac Enzymes: No results for input(s): "CKTOTAL", "CKMB", "CKMBINDEX", "TROPONINI" in the last 168 hours. BNP (last 3 results) No results for input(s): "PROBNP" in the last 8760 hours. HbA1C: No results for input(s): "HGBA1C" in the last 72 hours. CBG: Recent Labs  Lab 10/23/23 1632  GLUCAP 122*   Lipid Profile: Recent Labs    10/29/23 0327  TRIG 135   Thyroid Function Tests: No results for input(s): "TSH", "T4TOTAL", "FREET4", "T3FREE", "THYROIDAB" in the last 72 hours. Anemia Panel: No results for input(s): "VITAMINB12", "FOLATE", "FERRITIN", "TIBC", "IRON", "RETICCTPCT" in the last 72 hours. Sepsis Labs: No results for input(s): "PROCALCITON", "LATICACIDVEN" in the last 168 hours.  No results found for this or any previous visit (from the past 240 hour(s)).       Radiology Studies: No results found.      Scheduled Meds:  Chlorhexidine Gluconate Cloth  6 each Topical Daily   docusate sodium  100 mg Oral BID   Gerhardt's butt cream   Topical BID   ketotifen  2 drop Both Eyes BID   multivitamin with minerals  1 tablet Oral Daily   polyethylene glycol  17 g Oral Daily   sodium chloride flush  10-40 mL Intracatheter Q12H   Continuous Infusions:  TPN (CLINIMIX-E) Adult     And   fat emul(SMOFlipid)     TPN (CLINIMIX-E) Adult  82 mL/hr at 10/30/23 0159     LOS: 33 days    Time spent: 35 minutes    Davin Muramoto A Alex Leahy, MD [facility]   If 7PM-7AM, please contact night-coverage [email or web address]  10/30/2023, 2:20 PM

## 2023-10-30 NOTE — Progress Notes (Signed)
OT Cancellation Note  Patient Details Name: Elizabeth Odom MRN: [id number] DOB: 1941-02-07   Cancelled Treatment:    Reason Eval/Treat Not Completed: Patient declined, no reason specified Patient reported getting transferred rooms late last night and planning to take it easy today. OT to continue to follow and check back as schedule will allow.  Rosalio Loud, MS Acute Rehabilitation Department Office# [phone] 10/30/2023, 3:04 PM

## 2023-10-30 NOTE — Progress Notes (Signed)
29 Days Post-Op   Subjective/Chief Complaint: Feeling well today.  She is about to order some "brunch" and plans to get scrambled eggs, potatoes and milk.   Objective: Vital signs in last 24 hours: Temp:  [97.9 F (36.6 C)-98.4 F (36.9 C)] 97.9 F (36.6 C) (11/19 0605) Pulse Rate:  [67-72] 68 (11/19 0605) Resp:  [16-18] 18 (11/19 0605) BP: (122-143)/(67-76) 122/67 (11/19 0605) SpO2:  [96 %-100 %] 96 % (11/19 0605) Last BM Date : 10/30/23  Intake/Output from previous day: 11/18 0701 - 11/19 0700 In: 1625.1 [P.O.:460; I.V.:1165.1] Out: 1635 [Urine:1450; Drains:5; Stool:180] Intake/Output this shift: Total I/O In: 907.4 [P.O.:240; I.V.:657.4; Other:10] Out: 460 [Urine:400; Drains:10; Stool:50]  Alert, calm Unlabored respirations Abdomen is soft, nondistended, nontender.  Midline wound is granulating and shallow without any unusual drainage.  Ostomy with dark liquid output.  Drain output with thin brown output; volume very low 10 cc / 24 hours  Lab Results:  No results for input(s): "WBC", "HGB", "HCT", "PLT" in the last 72 hours.  BMET Recent Labs    10/28/23 1120 10/29/23 0327  NA 132* 130*  K 3.9 3.8  CL 103 101  CO2 23 23  GLUCOSE 123* 112*  BUN 21 20  CREATININE 0.44 0.48  CALCIUM 8.6* 8.5*   PT/INR No results for input(s): "LABPROT", "INR" in the last 72 hours. ABG No results for input(s): "PHART", "HCO3" in the last 72 hours.  Invalid input(s): "PCO2", "PO2"  Studies/Results: No results found.  Anti-infectives: Anti-infectives (From admission, onward)    Start     Dose/Rate Route Frequency Ordered Stop   10/15/23 1800  vancomycin (VANCOCIN) IVPB 1000 mg/200 mL premix  Status:  Discontinued        1,000 mg 200 mL/hr over 60 Minutes Intravenous Every 24 hours 10/14/23 1740 10/15/23 1045   10/15/23 1430  piperacillin-tazobactam (ZOSYN) IVPB 3.375 g        3.375 g 100 mL/hr over 30 Minutes Intravenous  Once 10/15/23 1343 10/15/23 1503   10/15/23  1400  DAPTOmycin (CUBICIN) IVPB 500 mg/10mL premix  Status:  Discontinued        8 mg/kg  64.1 kg 100 mL/hr over 30 Minutes Intravenous Daily 10/15/23 1045 10/22/23 1321   10/14/23 1800  vancomycin (VANCOREADY) IVPB 1250 mg/250 mL        1,250 mg 166.7 mL/hr over 90 Minutes Intravenous  Once 10/14/23 1740 10/14/23 2255   10/14/23 1400  piperacillin-tazobactam (ZOSYN) IVPB 3.375 g  Status:  Discontinued        3.375 g 12.5 mL/hr over 240 Minutes Intravenous Every 8 hours 10/14/23 0845 10/22/23 1321   10/14/23 0900  piperacillin-tazobactam (ZOSYN) IVPB 3.375 g        3.375 g 100 mL/hr over 30 Minutes Intravenous NOW 10/14/23 0844 10/14/23 1002   10/05/23 1200  piperacillin-tazobactam (ZOSYN) IVPB 3.375 g        3.375 g 12.5 mL/hr over 240 Minutes Intravenous Every 8 hours 10/05/23 0721 10/09/23 1716   10/04/23 2000  piperacillin-tazobactam (ZOSYN) IVPB 3.375 g  Status:  Discontinued        3.375 g 12.5 mL/hr over 240 Minutes Intravenous Every 8 hours 10/04/23 1827 10/05/23 0721   09/27/23 2315  piperacillin-tazobactam (ZOSYN) IVPB 3.375 g  Status:  Discontinued        3.375 g 12.5 mL/hr over 240 Minutes Intravenous Every 8 hours 09/27/23 2305 10/02/23 1106       Assessment/Plan: POD 29 s/p Hartman's resection and colostomy with SBR  for diverticular stricture Dr. [name] - CT 10/24 with hematoma in the pelvis adjacent to small bowel anastomosis and ileus vs obstruction  - IR 11/1: drain placement for small bowel anastomotic leak/ abscess drainage - CT 11/8 with resolution in previous right hemipelvic collection and no leakage of enteric contrast - drain injection study 11/11 confirms fistulous connection - drain left in place. Stop flushing drain and placed to gravity.  Anticipate drain study next week - reg diet and TPN, supplements; appreciate ongoing care from dietitian.  Wean TPN when able. - getting in enough nutritionally will be key to trying to heal fistula which fortunately  appears to be low output at this point - continue daily dressing changes  - Needs mobilization and physical conditioning   FEN: PICC/TPN; reg diet  VTE: IVC Filter  ID: no current abx     ABL anemia secondary to post-op hematomas and hematuria  Acute PE and LLE DVT Pancreatic tail cyst Thyroid nodule  Chronic HFpEF Paroxysmal A. Fib Pre-diabetes RLS    Berna Bue 10/30/2023

## 2023-10-30 NOTE — Progress Notes (Signed)
PHARMACY - TOTAL PARENTERAL NUTRITION CONSULT NOTE   Indication:  Prolonged ileus  Patient Measurements: Height: 5\' 5"  (165.1 cm) Weight: 58.2 kg (128 lb 6.4 oz) IBW/kg (Calculated) : 57 TPN AdjBW (KG): 62 Body mass index is 21.37 kg/m. Usual Weight: 64.5 on 10/04/2023  Recent Labs    10/28/23 1120 10/29/23 0327  NA 132* 130*  K 3.9 3.8  CL 103 101  CO2 23 23  GLUCOSE 123* 112*  BUN 21 20  CREATININE 0.44 0.48  CALCIUM 8.6* 8.5*  PHOS  --  3.0  MG  --  1.9  ALBUMIN  --  2.4*  ALKPHOS  --  152*  AST  --  36  ALT  --  58*  BILITOT  --  0.4  TRIG  --  135     Assessment:  TPN per pharmacy consult for this 82 yo female admitted on 09/27/2023 with recurrent abdominal pain, nausea, emesis, diarrhea and intolerance of oral intake. Surgery completed exp lap with small bowel resection and Hartman's resection and colostomy on 10/01/2023.  Post-op ileus, 10/31 imaging showed anastomotic leak - drain placed by IR on 11/1 for abscess.  Glucose / Insulin: No hx DM. Goal CBG <180 - SSI discontinued Electrolytes: Na remains slightly low, CO2 now WNL.  Others WNL including CorrCa   Renal: SCr < 1, BUN WNL Hepatic: LFTs slightly elevated but stable, Alk Phos improving - Albumin low but improving - TG improved to WNL Intake / Output; MIVF: no mIVF - UOP: 1350 mL. Drain output: 10 mL, ostomy: 220 mL GI Imaging: 10/10 CT A/P: small bowel enteritis/colitis that was also involving the sigmoid colon. No bowel obstruction seen.  10/17 CT A/P: wall thickening sigmoid colon worrisome for neoplasm. Focal colitis not excluded. Colonic dilation compatible with obstruction.  Cystic mass at the tail of the pancreas.  10/18 MR - 9 mm cystic lesion within pancreatic body/tail junction, colonic obstruction 10/31 CT A/P - colostomy and Hartmann's pouch seen.  Appears to be extravasation of contrast suggesting perforation or leakage. 11/8 CT A/P: No persistent anastomotic leak or fistula. GI  Surgeries / Procedures:  10/19 flexible sigmoidoscopy 10/21 colectomy with colostomy creation/Hartman procedure 11/1: CT guided visceral fluid drain by perc cath  Central access: PICC line placed 11/1 TPN start date: 11/1  Nutritional Goals: Due to the Baxter IV fluid disruption, we will be switching all adult compounded parenteral nutrition to premade Clinimix products +/- fat emulsion infusion. Our goal will be to continue providing as close to 100% of our patient's nutritional needs. Due to the volatile availability of different Clinimix concentrations we may not be able to meet 100% of adult patient nutritional goals at this time.   TPN: Clinimix E 8/10, 1L x 5 days per week and 2L x 2 days per week with daily lipids ( ) to provide 103 g protein and an average of 1353 kcal per day D10 previously infusing in addition to Clinimix to provide additional kcal, however, discontinued 11/9 given hyponatremia  RD Assessment:  Estimated Needs Total Energy Estimated Needs: 1900-2100 Total Protein Estimated Needs: 90-105g Total Fluid Estimated Needs: 2L/day  Current Nutrition:  TPN Diet: Regular. Calorie count in progress.  Magic cup TID (each provides 290 kcal, 9 g protein) Prosource Plus PO BID (each provides 100 kcal, 15 g protein)  Plan:  Continue TPN with Clinimix E 8/10 + SMOF lipids Clinimix 2L on Monday, Thursday (82 mL/hr).  1L all other days of the week (41 mL/hr) Electrolytes in  1000 ml TPN (unable to adjust): Na 35 mEq/L, K 30 mEq/L, Ca 4.5 mEq/L, Mg 5 mEq/L, and Phos 15 mmol/L. Continue PO MVI No CBG checks/SSI  Monitor TPN labs on Mon/Thurs and as needed Follow up ability to wean off TPN   Pricilla Riffle, PharmD, BCPS Clinical Pharmacist 10/30/2023 2:05 PM

## 2023-10-30 NOTE — Plan of Care (Signed)
  Problem: Fluid Volume: Goal: Ability to maintain a balanced intake and output will improve Outcome: Progressing   Problem: Health Behavior/Discharge Planning: Goal: Ability to identify and utilize available resources and services will improve Outcome: Progressing

## 2023-10-31 DIAGNOSIS — D500 Iron deficiency anemia secondary to blood loss (chronic): Secondary | ICD-10-CM

## 2023-10-31 DIAGNOSIS — E440 Moderate protein-calorie malnutrition: Secondary | ICD-10-CM

## 2023-10-31 DIAGNOSIS — R338 Other retention of urine: Secondary | ICD-10-CM

## 2023-10-31 DIAGNOSIS — I2699 Other pulmonary embolism without acute cor pulmonale: Secondary | ICD-10-CM

## 2023-10-31 DIAGNOSIS — K56609 Unspecified intestinal obstruction, unspecified as to partial versus complete obstruction: Secondary | ICD-10-CM | POA: Diagnosis not present

## 2023-10-31 DIAGNOSIS — K529 Noninfective gastroenteritis and colitis, unspecified: Secondary | ICD-10-CM | POA: Diagnosis not present

## 2023-10-31 DIAGNOSIS — I824Y2 Acute embolism and thrombosis of unspecified deep veins of left proximal lower extremity: Secondary | ICD-10-CM | POA: Diagnosis not present

## 2023-10-31 DIAGNOSIS — E871 Hypo-osmolality and hyponatremia: Secondary | ICD-10-CM

## 2023-10-31 MED ORDER — FAT EMUL FISH OIL/PLANT BASED 20% (SMOFLIPID)IV EMUL
250.0000 mL | INTRAVENOUS | Status: AC
  Administered 2023-10-31: 250 mL via INTRAVENOUS
  Filled 2023-10-31: qty 250

## 2023-10-31 MED ORDER — CLINIMIX E/DEXTROSE (8/10) 8 % IV SOLN
INTRAVENOUS | Status: AC
  Filled 2023-10-31 (×2): qty 1000

## 2023-10-31 NOTE — Progress Notes (Signed)
PROGRESS NOTE    Elizabeth Odom  NGE:[id number] DOB: 1941-07-28 DOA: 09/27/2023 PCP: [name], FNP    Chief Complaint  Patient presents with   Abdominal Pain    Brief Narrative:  82 year old with past medical history significant for A-fib, RLS, anemia presented to the ED 10/17 with recurrent abdominal pain, nausea emesis, diarrhea intolerance of oral intake.  She was evaluated in the ED 10/10 for abdominal pain, decreased appetite, fever, diagnosed with possible colitis noted on CT abdomen and pelvis discharged on Augmentin.  She had recurrence of symptoms and presented back to the hospital.  CT initially showed proximal sigmoid colon wall thickening with upstream colonic dilation with air-fluid levels and large amount of stool compatible with obstruction.  9 mm cystic lesion in the tail of the pancreas and subcentimeter lucent lesion in L1 vertebral body.  GI consulted, patient refused colonoscopy but agreed to sigmoidoscopy showing stricture versus mass at the rectosigmoid region.  Scope could not be advanced past this.  Surgery was consulted and she underwent a small bowel resection and Hartman's resection and  colostomy on 10/01/2023.  She continued to have postoperative ileus managed by general surgery.  NG remains in place to suction.   Hospital course also complicated by acute PE and left lower extremity DVT she was started on heparin.  She subsequently developed acute blood loss anemia and subsequent pelvic hematoma near the small bowel anastomosis as well as notable hematuria.  Urology was consulted.  Signed off after hematuria resolution.  Given high risk for bleeding with notable PE DVT evaluated by IR and placed IVC filter 10/05/2023.  Patient required total of 4 units of packed red blood cell.   As her bowel function did not return she underwent a repeat CT abdomen and pelvis on 10/31st showing anastomotic leak into 7 x 6 cm complex fluid collection posteriorly in the pelvis.   IR was consulted and underwent CT-guided percutaneous drain placement into pelvic abscess.  Fluid grew MRSA.  ID consulted and once transitioned to daptomycin and Zosyn.   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  ID recommended no further need of antibiotics.    Needs to be wean off TPN to be able to go to SNF   Assessment & Plan:   Principal Problem:   Large bowel obstruction (HCC) Active Problems:   Acute colitis   Left leg DVT (HCC)   ABLA (acute blood loss anemia)   Palliative care encounter   Counseling and coordination of care   Need for emotional support   Goals of care, counseling/discussion   High risk medication use   Pain   Medication management   Pressure injury of skin   Malnutrition of moderate degree   Iron deficiency anemia due to chronic blood loss   Acute pulmonary embolism (HCC)   Hyponatremia   Acute urinary retention   #1 large bowel obstruction status post Hartman's resection, colostomy complicated by anastomosis leak of pelvic abscess requiring drain placement/fistula connection with colon staple line anastomosis/postop ileus versus partial small bowel obstruction -Patient noted to have presented abdominal pain with CT abdomen and pelvis done 09/27/2023 with proximal sigmoid colon thickening. -Patient seen in consultation by GI underwent sigmoidoscopy 09/29/2023 which showed a tight stricture found to have a mass, biopsy was negative for malignancy suggesting inflammation. -General Surgery consulted status post expiratory laparotomy Hartman's resection 10/01/2023 with small bowel resection and ostomy per Dr. [name]. -Repeat  CT abdomen and pelvis 10/11/2023 with small leak at the anastomosis going into 6.7 cm fluid collection. -Patient seen in consultation by IR underwent drain catheter placement of pelvic abscess on 10/12/2023 with cultures growing MRSA. -Patient seen in consultation  by ID and treated with daptomycin and Zosyn. -Repeat CT abdomen and pelvis 10/18/2023 with percutaneous peak tail drain catheter with no persistent anastomotic leak or fistula. -10/22/2023 pelvic drain injection study showed fistulous communication with colon/staple line anastomosis with drain to remain in place. -Per ID no further indication for antibiotics antibiotics subsequently discontinued. -Currently on TPN, soft diet, general surgery following. -Per general surgery wean TPN when able to. -Continue daily dressings. -Mobilization. -Per general surgery.  2.  Hyponatremia -Was on D10 which was subsequently discontinued with improvement with hyponatremia.  3.  Acute blood loss anemia status post perianastomotic hematoma with hematuria -Posttransfusion 4 units PRBCs during the hospitalization. -Status post IVC filter. -Seen in consultation by urology no indication for cystoscopy or intervention at this time. -Hemoglobin currently stabilized at 10.0. -Follow H&H.  4.  Acute PE (small to moderate clot burden) without right heart strain/acute left lower extremity DVT -Patient noted unable to tolerate anticoagulation status post IVC filter placement 10/05/2023. -2D echo with no evidence of right heart strain. -CT scan with no evidence of right heart strain.  5.  Thyroid nodule in the inferior aspect of the isthmus -Outpatient follow-up.  6.  Paroxysmal atrial fibrillation -Currently rate controlled. -Not on anticoagulation secondary to problem #3.  7.  Hypokalemia/hypophosphatemia/hypomagnesemia -On TPN. -Electrolytes being replaced per pharmacy.  8.  Urinary retention -Continue Foley catheter.  9.  Hypotension -Resolved.  10.  Leukocytosis -Resolved.  11.  Moderate protein calorie malnutrition -Currently on TPN. -Diet advanced.  12.  Pressure injury right Botox stage II/MASD Pressure Injury 10/08/23 Buttocks Right;Mid Stage 2 -  Partial thickness loss of dermis  presenting as a shallow open injury with a red, pink wound bed without slough. (Active)  10/08/23 0800  Location: Buttocks  Location Orientation: Right;Mid  Staging: Stage 2 -  Partial thickness loss of dermis presenting as a shallow open injury with a red, pink wound bed without slough.  Wound Description (Comments):   Present on Admission:         DVT prophylaxis: SCDs Code Status: DNR Family Communication: Updated patient.  No family at bedside. Disposition: SNF once weaned off TPN and cleared by general surgery.  Status is: Inpatient Remains inpatient appropriate because: Severity of illness   Consultants:  Gastroenterology: Dr. Lorenso Quarry 09/28/2023 General Surgery: Dr. [name] 09/29/2023 Wound care consult PCCM: Dr. Jamison Neighbor 10/04/2023 Urology: Dr. Cardell Peach 10/05/2023 Interventional radiology: Dr. Deanne Coffer 10/05/2023 Palliative care: Dr. Patterson Hammersmith 10/05/2023 Interventional radiology: Dr.Mugweru 10/12/2023 ID: Dr. Thedore Mins 10/13/2023   Procedures:  Flexible sigmoidoscopy per Dr. Marca Ancona GI 09/29/2023 IVC gram and IV filter placement per IR: Dr. Deanne Coffer 10/05/2023 Drainage catheter placement into pelvic abscess per IR: Dr.Mugweru 10/12/2023 CT abdomen and pelvis 09/27/2023, 10/11/2023, 10/19/2023 CT chest 09/30/2023 MRI abdomen 09/28/2023 Drain injection/fistulogram/abscessogram per IR 10/22/2023 2D echo 10/01/2023 Lower extremity Dopplers 10/04/2023 CT abdomen and pelvis without contrast 10/04/2023 Sigmoid colectomy with colostomy creation/Hartman's procedure/small bowel resection with anastomosis: Per general surgery: Dr. [name] 10/01/2023 Transfusion 4 units PRBCs  Antimicrobials:  Anti-infectives (From admission, onward)    Start     Dose/Rate Route Frequency Ordered Stop   10/15/23 1800  vancomycin (VANCOCIN) IVPB 1000 mg/200 mL premix  Status:  Discontinued        1,000 mg 200 mL/hr over  60 Minutes Intravenous Every 24 hours 10/14/23 1740 10/15/23 1045   10/15/23 1430   piperacillin-tazobactam (ZOSYN) IVPB 3.375 g        3.375 g 100 mL/hr over 30 Minutes Intravenous  Once 10/15/23 1343 10/15/23 1503   10/15/23 1400  DAPTOmycin (CUBICIN) IVPB 500 mg/8mL premix  Status:  Discontinued        8 mg/kg  64.1 kg 100 mL/hr over 30 Minutes Intravenous Daily 10/15/23 1045 10/22/23 1321   10/14/23 1800  vancomycin (VANCOREADY) IVPB 1250 mg/250 mL        1,250 mg 166.7 mL/hr over 90 Minutes Intravenous  Once 10/14/23 1740 10/14/23 2255   10/14/23 1400  piperacillin-tazobactam (ZOSYN) IVPB 3.375 g  Status:  Discontinued        3.375 g 12.5 mL/hr over 240 Minutes Intravenous Every 8 hours 10/14/23 0845 10/22/23 1321   10/14/23 0900  piperacillin-tazobactam (ZOSYN) IVPB 3.375 g        3.375 g 100 mL/hr over 30 Minutes Intravenous NOW 10/14/23 0844 10/14/23 1002   10/05/23 1200  piperacillin-tazobactam (ZOSYN) IVPB 3.375 g        3.375 g 12.5 mL/hr over 240 Minutes Intravenous Every 8 hours 10/05/23 0721 10/09/23 1716   10/04/23 2000  piperacillin-tazobactam (ZOSYN) IVPB 3.375 g  Status:  Discontinued        3.375 g 12.5 mL/hr over 240 Minutes Intravenous Every 8 hours 10/04/23 1827 10/05/23 0721   09/27/23 2315  piperacillin-tazobactam (ZOSYN) IVPB 3.375 g  Status:  Discontinued        3.375 g 12.5 mL/hr over 240 Minutes Intravenous Every 8 hours 09/27/23 2305 10/02/23 1106         Subjective: Patient sleeping but easily arousable.  Denies any chest pain or shortness of breath.  Passing flatus.  Having stool through ostomy output.  Still with some diffuse abdominal pain but improved since admission.  Objective: Vitals:   10/31/23 0500 10/31/23 0550 10/31/23 1307 10/31/23 1932  BP:  118/62 120/66 125/67  Pulse:  68 66 72  Resp:  18 16 18   Temp:  99.3 F (37.4 C) 97.6 F (36.4 C) 98.2 F (36.8 C)  TempSrc:  Oral Oral Oral  SpO2:  95% 100% 98%  Weight: 59.9 kg     Height:        Intake/Output Summary (Last 24 hours) at 10/31/2023 2005 Last data  filed at 10/31/2023 1832 Gross per 24 hour  Intake 1929.53 ml  Output 2692 ml  Net -762.47 ml   Filed Weights   10/25/23 0713 10/28/23 0753 10/31/23 0500  Weight: 62.2 kg 58.2 kg 59.9 kg    Examination:  General exam: Appears calm and comfortable  Respiratory system: Clear to auscultation anterior lung fields.  No wheezes, no crackles, no rhonchi.  Fair air movement.  Speaking in full sentences.Marland Kitchen Respiratory effort normal. Cardiovascular system: S1 & S2 heard, RRR. No JVD, murmurs, rubs, gallops or clicks. No pedal edema. Gastrointestinal system: Abdomen is nondistended, soft and some diffuse tenderness to palpation.  Bandage over midline wound.  Ostomy with dark liquid output.  Positive bowel sounds.  Central nervous system: Alert and oriented. No focal neurological deficits. Extremities: Symmetric 5 x 5 power. Skin: No rashes, lesions or ulcers Psychiatry: Judgement and insight appear normal. Mood & affect appropriate.     Data Reviewed: I have personally reviewed following labs and imaging studies  CBC: Recent Labs  Lab 10/27/23 0312  WBC 7.0  HGB 10.0*  HCT 30.9*  MCV  96.0  PLT 219    Basic Metabolic Panel: Recent Labs  Lab 10/25/23 0350 10/27/23 0312 10/28/23 1120 10/29/23 0327  NA 131* 131* 132* 130*  K 3.8 3.7 3.9 3.8  CL 103 104 103 101  CO2 23 21* 23 23  GLUCOSE 115* 111* 123* 112*  BUN 19 26* 21 20  CREATININE 0.55 0.46 0.44 0.48  CALCIUM 8.5* 8.6* 8.6* 8.5*  MG 1.8 1.9  --  1.9  PHOS 3.2 3.3  --  3.0    GFR: Estimated Creatinine Clearance: 47.9 mL/min (by C-G formula based on SCr of 0.48 mg/dL).  Liver Function Tests: Recent Labs  Lab 10/25/23 0350 10/29/23 0327  AST 47* 36  ALT 65* 58*  ALKPHOS 180* 152*  BILITOT 0.5 0.4  PROT 7.1 7.3  ALBUMIN 2.1* 2.4*    CBG: No results for input(s): "GLUCAP" in the last 168 hours.   No results found for this or any previous visit (from the past 240 hour(s)).       Radiology Studies: No  results found.      Scheduled Meds:  Chlorhexidine Gluconate Cloth  6 each Topical Daily   docusate sodium  100 mg Oral BID   Gerhardt's butt cream   Topical BID   ketotifen  2 drop Both Eyes BID   multivitamin with minerals  1 tablet Oral Daily   polyethylene glycol  17 g Oral Daily   sodium chloride flush  10-40 mL Intracatheter Q12H   Continuous Infusions:  TPN (CLINIMIX-E) Adult 41 mL/hr at 10/31/23 1757   And   fat emul(SMOFlipid) 250 mL (10/31/23 1800)     LOS: 34 days    Time spent: 35 minutes    Ramiro Harvest, MD [facility]   To contact the attending provider between 7A-7P or the covering provider during after hours 7P-7A, please log into the web site [email or web address] and access using universal [place] password for that web site. If you do not have the password, please call the hospital operator.  10/31/2023, 8:05 PM

## 2023-10-31 NOTE — Progress Notes (Signed)
Referring Physician(s): Dr. Gwyndolyn Kaufman  Supervising Physician: Malachy Moan  Patient Status:  [facility] - In-pt  Chief Complaint:  Pelvic abscess  Subjective:  Patient is alert, though remains somewhat uncomfortable.  Mild and constant discomfort with laying on drain site.  Patient was laying on the drain collection bag on rounds. No output in bag noted.  Patient is unable to rotate side to side unassisted.  Some tenderness to palpation at drain site.  Afebrile.    Allergies: Elemental sulfur  Medications: Prior to Admission medications   Medication Sig Start Date End Date Taking? Authorizing Provider  amoxicillin-clavulanate (AUGMENTIN) 875-125 MG tablet Take 1 tablet by mouth every 12 (twelve) hours. 09/21/23  Yes Horton, Kristie M, DO  ondansetron (ZOFRAN-ODT) 4 MG disintegrating tablet Take 4 mg by mouth every 8 (eight) hours as needed for vomiting or nausea. 09/16/23  Yes [provider]  meclizine (ANTIVERT) 25 MG tablet Take 25 mg by mouth 3 (three) times daily. Patient not taking: Reported on 11/23/2020    [provider]  Omega-3 Fatty Acids (FISH OIL) 500 MG CAPS Take 1 capsule by mouth daily. Patient not taking: Reported on 09/27/2023    [provider]     Vital Signs: BP 118/62 (BP Location: Right Arm)   Pulse 68   Temp 99.3 F (37.4 C) (Oral)   Resp 18   Ht 5\' 5"  (1.651 m)   Wt 132 lb (59.9 kg)   SpO2 95%   BMI 21.97 kg/m   Physical Exam Skin:    General: Skin is warm and dry.     Comments: Alert. Rt TG/pelvic drain c/d/i.  Insertion site mildly tender, OP is brown. Drain not flushed due to fistula.       Imaging: No results found.  Labs:  CBC: Recent Labs    10/20/23 0549 10/23/23 0312 10/24/23 0334 10/27/23 0312  WBC 8.6 7.7 6.9 7.0  HGB 10.4* 10.1* 10.3* 10.0*  HCT 31.9* 31.5* 32.1* 30.9*  PLT 312 270 265 219    COAGS: Recent Labs    09/20/23 1902 10/05/23 0247 10/11/23 1255 10/12/23 0655  INR  1.1 1.1 1.1 1.1  APTT  --  30  --   --     BMP: Recent Labs    10/25/23 0350 10/27/23 0312 10/28/23 1120 10/29/23 0327  NA 131* 131* 132* 130*  K 3.8 3.7 3.9 3.8  CL 103 104 103 101  CO2 23 21* 23 23  GLUCOSE 115* 111* 123* 112*  BUN 19 26* 21 20  CALCIUM 8.5* 8.6* 8.6* 8.5*  CREATININE 0.55 0.46 0.44 0.48  GFRNONAA >60 >60 >60 >60    LIVER FUNCTION TESTS: Recent Labs    10/18/23 0533 10/22/23 0354 10/25/23 0350 10/29/23 0327  BILITOT 0.7 0.6 0.5 0.4  AST 45* 48* 47* 36  ALT 49* 59* 65* 58*  ALKPHOS 148* 205* 180* 152*  PROT 6.1* 6.2* 7.1 7.3  ALBUMIN 2.2* 2.1* 2.1* 2.4*    Assessment and Plan:  82 yo female with hx PE/LLE DVT, PAF, 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 CCS notified. Continue drain. Discontinue drain flushes.   Drain Location: RTG Size: Fr size: 12 Fr Date of placement: 11/1  Currently to: Drain collection device: gravity 24 hour output:  Output by Drain (mL)  10/29/23 0701 - 10/27/23 1900 10/29/23 1901 - 10/28/23 0700 10/30/23 0701 - 10/28/23 1900 10/30/23 1901 - 10/29/23 0700 10/31/23 0701 - 10/29/23 1102  Closed System Drain 1 Right Buttock  5 0 30 25        Interval imaging/drain manipulation:  None     Current examination: Insertion site unremarkable. Suture and stat lock in place. Dressed appropriately.    Plan: Do not flush drain. Record output Q shift. Dressing changes QD or PRN if soiled.  Call IR APP or on call IR MD if difficulty sudden change in drain output.  Repeat imaging/possible drain injection in 2 weeks from initial study on 11/11.  Consideration for drain removal if output is < 10 mL/QD (excluding flush material), pending discussion with the providing surgical  service.   Discharge planning: Please contact IR APP or on call IR MD prior to patient d/c to ensure appropriate follow up plans are in place. Typically patient will follow up with IR clinic 10-14 days post d/c for repeat imaging/possible drain injection. IR scheduler will contact patient with date/time of appointment. Patient will possibly need to flush drain QD with 5 cc NS, record output QD, dressing changes every 2-3 days or earlier if soiled.    IR will continue to follow - please call with questions or concerns.  Electronically Signed: Sable Feil, PA-C 10/31/2023, 8:04 AM   I spent a total of 15 Minutes at the the patient's bedside AND on the patient's hospital floor or unit, greater than 50% of which was counseling/coordinating care for pelvic abscess drain

## 2023-10-31 NOTE — Progress Notes (Signed)
Occupational Therapy Treatment Patient Details Name: Elizabeth Odom MRN: [id number] DOB: 1941-12-05 Today's Date: 10/31/2023   History of present illness Patient is a 82 year old female who presented on 10/17 with acute colitis. on10/21, patient underwent ex laparotomy with hartman's resection and colostomy and small bowel resection. PMH: a fib, RLS, syncope, cervical CA, cervical spine fusions, anemia   OT comments  Patient was able to progress to recliner in room with mod A x2 for bed mobility and transfers. Patient was educated on importance of getting up out of bed each day. Patient verbalized understanding. Patient continues to have increased pain impacting progress during sessions. Patient endorsed feeling warm at end of session after transfer with patient already with BLE elevated and chair reclined. Provided with cool rag for face, temp in room adjusted and BP was assessed. Patients BP was 120/73 mmhg. Patient reported it dissipated after a few bites of italian ice. Patient will benefit from continued inpatient follow up therapy, <3 hours/day. Patient's discharge plan remains appropriate at this time. OT will continue to follow acutely.        If plan is discharge home, recommend the following:  A lot of help with bathing/dressing/bathroom;Assistance with cooking/housework;Assist for transportation;A lot of help with walking and/or transfers;Direct supervision/assist for medications management   Equipment Recommendations  None recommended by OT       Precautions / Restrictions Precautions Precautions: Fall Precaution Comments: R post. drain, colostomy, sacral wound and drain, abd  surgical wound Restrictions Weight Bearing Restrictions: No Other Position/Activity Restrictions: abdominal surgery, colostomy       Mobility Bed Mobility Overal bed mobility: Needs Assistance Bed Mobility: Supine to Sit   Sidelying to sit: Mod assist, +2 for physical assistance, +2 for  safety/equipment       General bed mobility comments: with increased time to progress trunk and LE into seated position EOB.          Balance Overall balance assessment: Needs assistance Sitting-balance support: Feet supported, Bilateral upper extremity supported Sitting balance-Leahy Scale: Fair Sitting balance - Comments: posterior leaning.   Standing balance support: Bilateral upper extremity supported, Reliant on assistive device for balance, During functional activity Standing balance-Leahy Scale: Poor       ADL either performed or assessed with clinical judgement   ADL Overall ADL's : Needs assistance/impaired     Grooming: Wash/dry face;Sitting;Set up       Toilet Transfer: Moderate assistance;+2 for physical assistance;+2 for safety/equipment;Rolling walker (2 wheels);Stand-pivot Statistician Details (indicate cue type and reason): to recliner in room with incresed time. patient needed cues for speed and proper positioning.   Toileting - Clothing Manipulation Details (indicate cue type and reason): patients dressing on bottom was noted to have unstuck to the patient. patient's nurse called into room and adressed it prior to continuing progression off bed.              Cognition Arousal: Alert Behavior During Therapy: WFL for tasks assessed/performed Overall Cognitive Status: Within Functional Limits for tasks assessed           General Comments: patient was motivated to participate in session,.                   Pertinent Vitals/ Pain       Pain Assessment Pain Assessment: Faces Faces Pain Scale: Hurts little more Pain Location: abdomen Pain Descriptors / Indicators: Constant Pain Intervention(s): Limited activity within patient's tolerance, Monitored during session  Frequency  Min 1X/week        Progress Toward Goals  OT Goals(current goals can now be found in the care plan section)  Progress towards OT goals:  Progressing toward goals     Plan      Co-evaluation    PT/OT/SLP Co-Evaluation/Treatment: Yes Reason for Co-Treatment: To address functional/ADL transfers PT goals addressed during session: Mobility/safety with mobility OT goals addressed during session: ADL's and self-care      AM-PAC OT "6 Clicks" Daily Activity     Outcome Measure   Help from another person eating meals?: A Little Help from another person taking care of personal grooming?: A Little Help from another person toileting, which includes using toliet, bedpan, or urinal?: Total Help from another person bathing (including washing, rinsing, drying)?: A Lot Help from another person to put on and taking off regular upper body clothing?: A Lot Help from another person to put on and taking off regular lower body clothing?: Total 6 Click Score: 12    End of Session Equipment Utilized During Treatment: Gait belt;Rolling walker (2 wheels)  OT Visit Diagnosis: Muscle weakness (generalized) (M62.81);Pain   Activity Tolerance Patient limited by pain   Patient Left with call bell/phone within reach;in chair   Nurse Communication Mobility status;Other (comment) (called into room about dressings)        Time: 4098-1191 OT Time Calculation (min): 26 min  Charges: OT Treatments $Therapeutic Activity: 8-22 mins  Rosalio Loud, MS Acute Rehabilitation Department Office# [phone]   Selinda Flavin 10/31/2023, 4:06 PM

## 2023-10-31 NOTE — Progress Notes (Signed)
Physical Therapy Treatment Patient Details Name: Elizabeth Odom MRN: [id number] DOB: February 10, 1941 Today's Date: 10/31/2023   History of Present Illness Patient is a 82 year old female who presented on 10/17 with acute colitis. on10/21, patient underwent ex laparotomy with hartman's resection and colostomy and small bowel resection. PMH: a fib, RLS, syncope, cervical CA, cervical spine fusions, anemia    PT Comments  Pt agreeable to therapy, reports having a bad day yesterday and today but agreeable to attempt transfer to recliner. Verbal cues for sequencing with rolling into side and then transitioning up into sitting. Pt needing mod A+2 with bed mobility and transfers. Verbal cues for widening foot positioning to power up to stand, pt maintains forward flexed trunk in standing. Pt able to take 2-3 steps over to recliner chair at bedside. Pt reports being hot once in recliner; BP noted 120/73, provided cool rag, adjusted room temp, removed socks and pt reports improvement. Patient will benefit from continued inpatient follow up therapy, <3 hours/day.   If plan is discharge home, recommend the following: A lot of help with walking and/or transfers;A lot of help with bathing/dressing/bathroom;Assistance with cooking/housework;Help with stairs or ramp for entrance;Two people to help with walking and/or transfers   Can travel by private vehicle     No  Equipment Recommendations       Recommendations for Other Services       Precautions / Restrictions Precautions Precautions: Fall Precaution Comments: R post. drain, colostomy, sacral wound and drain, abd  surgical wound Restrictions Weight Bearing Restrictions: No Other Position/Activity Restrictions: abdominal surgery, colostomy     Mobility  Bed Mobility Overal bed mobility: Needs Assistance Bed Mobility: Supine to Sit   Sidelying to sit: Mod assist, +2 for physical assistance, +2 for safety/equipment       General bed mobility  comments: with increased time to progress trunk and LE into seated position EOB, cued pt to go through log roll due to abdominal surgery    Transfers Overall transfer level: Needs assistance Equipment used: Rolling walker (2 wheels) Transfers: Sit to/from Stand, Bed to chair/wheelchair/BSC Sit to Stand: Mod assist, +2 safety/equipment   Step pivot transfers: Mod assist, +2 safety/equipment       General transfer comment: mod A+2 for safety with STS and step pivot transfer to recliner, pt slow to power up, verbal cues for widening BOS, pt maintains forward flexed trunk despite cues, no knee buckling or near falls    Ambulation/Gait                   Stairs             Wheelchair Mobility     Tilt Bed    Modified Rankin (Stroke Patients Only)       Balance Overall balance assessment: Needs assistance Sitting-balance support: Feet supported, Bilateral upper extremity supported Sitting balance-Leahy Scale: Fair Sitting balance - Comments: posterior leaning.   Standing balance support: Bilateral upper extremity supported, Reliant on assistive device for balance, During functional activity Standing balance-Leahy Scale: Poor                              Cognition Arousal: Alert Behavior During Therapy: WFL for tasks assessed/performed Overall Cognitive Status: Within Functional Limits for tasks assessed  General Comments: patient was motivated to participate in session,.        Exercises      General Comments        Pertinent Vitals/Pain Pain Assessment Pain Assessment: Faces Faces Pain Scale: Hurts little more Pain Location: abdomen Pain Descriptors / Indicators: Constant Pain Intervention(s): Limited activity within patient's tolerance, Monitored during session, Repositioned    Home Living                          Prior Function            PT Goals (current goals can  now be found in the care plan section) Acute Rehab PT Goals 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[employer] PT Goal Formulation: With patient Time For Goal Achievement: 11/14/23 Potential to Achieve Goals: Good Progress towards PT goals: Progressing toward goals    Frequency    Min 1X/week      PT Plan      Co-evaluation PT/OT/SLP Co-Evaluation/Treatment: Yes Reason for Co-Treatment: To address functional/ADL transfers PT goals addressed during session: Mobility/safety with mobility;Balance;Proper use of DME OT goals addressed during session: ADL's and self-care      AM-PAC PT "6 Clicks" Mobility   Outcome Measure  Help needed turning from your back to your side while in a flat bed without using bedrails?: A Lot Help needed moving from lying on your back to sitting on the side of a flat bed without using bedrails?: A Lot Help needed moving to and from a bed to a chair (including a wheelchair)?: A Lot Help needed standing up from a chair using your arms (e.g., wheelchair or bedside chair)?: A Lot Help needed to walk in hospital room?: Total Help needed climbing 3-5 steps with a railing? : Total 6 Click Score: 10    End of Session Equipment Utilized During Treatment: Gait belt Activity Tolerance: Patient limited by pain;Patient limited by fatigue Patient left: in chair;with call bell/phone within reach Nurse Communication: Mobility status PT Visit Diagnosis: Difficulty in walking, not elsewhere classified (R26.2);Muscle weakness (generalized) (M62.81);Unsteadiness on feet (R26.81);Pain Pain - part of body:  (abdomen)     Time: 6962-9528 PT Time Calculation (min) (ACUTE ONLY): 27 min  Charges:    $Therapeutic Activity: 8-22 mins PT General Charges $$ ACUTE PT VISIT: 1 Visit                      Tori Tamyah Cutbirth PT, DPT 10/31/23, 4:13 PM

## 2023-10-31 NOTE — Progress Notes (Addendum)
PHARMACY - TOTAL PARENTERAL NUTRITION CONSULT NOTE   Indication:  Prolonged ileus  Patient Measurements: Height: 5\' 5"  (165.1 cm) Weight: 59.9 kg (132 lb) IBW/kg (Calculated) : 57 TPN AdjBW (KG): 62 Body mass index is 21.97 kg/m. Usual Weight: 64.5 on 10/04/2023  Recent Labs    10/28/23 1120 10/29/23 0327  NA 132* 130*  K 3.9 3.8  CL 103 101  CO2 23 23  GLUCOSE 123* 112*  BUN 21 20  CREATININE 0.44 0.48  CALCIUM 8.6* 8.5*  PHOS  --  3.0  MG  --  1.9  ALBUMIN  --  2.4*  ALKPHOS  --  152*  AST  --  36  ALT  --  58*  BILITOT  --  0.4  TRIG  --  135     Assessment:  TPN per pharmacy consult for this 82 yo female admitted on 09/27/2023 with recurrent abdominal pain, nausea, emesis, diarrhea and intolerance of oral intake. Surgery completed exp lap with small bowel resection and Hartman's resection and colostomy on 10/01/2023.  Post-op ileus, 10/31 imaging showed anastomotic leak - drain placed by IR on 11/1 for abscess.  Glucose / Insulin: No hx DM. Goal CBG <180 - SSI discontinued Electrolytes: Na previously low, CO2 improved. Others WNL including CorrCa   Renal: SCr < 1, BUN WNL Hepatic: LFTs slightly elevated but stable, Alk Phos improving - Albumin low but improving - TG improved to WNL Intake / Output; MIVF: no mIVF - UOP: 2565 mL. Drain output: 55 mL, ostomy: 112 mL GI Imaging: 10/10 CT A/P: small bowel enteritis/colitis that was also involving the sigmoid colon. No bowel obstruction seen.  10/17 CT A/P: wall thickening sigmoid colon worrisome for neoplasm. Focal colitis not excluded. Colonic dilation compatible with obstruction.  Cystic mass at the tail of the pancreas.  10/18 MR - 9 mm cystic lesion within pancreatic body/tail junction, colonic obstruction 10/31 CT A/P - colostomy and Hartmann's pouch seen.  Appears to be extravasation of contrast suggesting perforation or leakage. 11/8 CT A/P: No persistent anastomotic leak or fistula. GI Surgeries /  Procedures:  10/19 flexible sigmoidoscopy 10/21 colectomy with colostomy creation/Hartman procedure 11/1: CT guided visceral fluid drain by perc cath  Central access: PICC line placed 11/1 TPN start date: 11/1  Nutritional Goals: Due to the Baxter IV fluid disruption, we will be switching all adult compounded parenteral nutrition to premade Clinimix products +/- fat emulsion infusion. Our goal will be to continue providing as close to 100% of our patient's nutritional needs. Due to the volatile availability of different Clinimix concentrations we may not be able to meet 100% of adult patient nutritional goals at this time.   TPN: Clinimix E 8/10, 1L x 5 days per week and 2L x 2 days per week with daily lipids ( ) to provide 103 g protein and an average of 1353 kcal per day D10 previously infusing in addition to Clinimix to provide additional kcal, however, discontinued 11/9 given hyponatremia  RD Assessment:  Estimated Needs Total Energy Estimated Needs: 1900-2100 Total Protein Estimated Needs: 90-105g Total Fluid Estimated Needs: 2L/day  Current Nutrition:  TPN Diet: Regular. Calorie count previously with inadequate intake Mighty Shake BID (each supp provides 220 kcal, 6 g protein)   Plan:  Continue TPN with Clinimix E 8/10 + SMOF lipids Clinimix 2L on Monday, Thursday (82 mL/hr)  1L all other days of the week (41 mL/hr) Electrolytes in 1000 ml TPN (unable to adjust): Na 35 mEq/L, K 30 mEq/L, Ca  4.5 mEq/L, Mg 5 mEq/L, and Phos 15 mmol/L Continue PO MVI No CBG checks/SSI  Monitor TPN labs on Mon/Thurs and as needed Patient discussed with dietitian today Intake improved 11/19; if continues to do well with intake today, recommend letting TPN run out 11/21 at 1800   Pricilla Riffle, PharmD, BCPS Clinical Pharmacist 10/31/2023 10:47 AM

## 2023-10-31 NOTE — Plan of Care (Signed)
  Problem: Fluid Volume: Goal: Ability to maintain a balanced intake and output will improve Outcome: Progressing   

## 2023-10-31 NOTE — Progress Notes (Signed)
30 Days Post-Op   Subjective/Chief Complaint: Pain yesterday.    Objective: Vital signs in last 24 hours: Temp:  [98.1 F (36.7 C)-99.3 F (37.4 C)] 99.3 F (37.4 C) (11/20 0550) Pulse Rate:  [66-72] 68 (11/20 0550) Resp:  [16-18] 18 (11/20 0550) BP: (118-130)/(62-73) 118/62 (11/20 0550) SpO2:  [95 %-98 %] 95 % (11/20 0550) Weight:  [59.9 kg] 59.9 kg (11/20 0500) Last BM Date : 10/31/23  Intake/Output from previous day: 11/19 0701 - 11/20 0700 In: 2932.8 [P.O.:810; I.V.:2112.8] Out: 3097 [Urine:2990; Drains:55; Stool:52] Intake/Output this shift: No intake/output data recorded.  Alert, calm Unlabored respirations Abdomen is soft, nondistended, nontender.  Midline wound is granulating and shallow without any unusual drainage.  Ostomy with dark liquid output.  Drain output with thin brown output; volume remains low 55cc / 24 hours  Lab Results:  No results for input(s): "WBC", "HGB", "HCT", "PLT" in the last 72 hours.  BMET Recent Labs    10/28/23 1120 10/29/23 0327  NA 132* 130*  K 3.9 3.8  CL 103 101  CO2 23 23  GLUCOSE 123* 112*  BUN 21 20  CREATININE 0.44 0.48  CALCIUM 8.6* 8.5*   PT/INR No results for input(s): "LABPROT", "INR" in the last 72 hours. ABG No results for input(s): "PHART", "HCO3" in the last 72 hours.  Invalid input(s): "PCO2", "PO2"  Studies/Results: No results found.  Anti-infectives: Anti-infectives (From admission, onward)    Start     Dose/Rate Route Frequency Ordered Stop   10/15/23 1800  vancomycin (VANCOCIN) IVPB 1000 mg/200 mL premix  Status:  Discontinued        1,000 mg 200 mL/hr over 60 Minutes Intravenous Every 24 hours 10/14/23 1740 10/15/23 1045   10/15/23 1430  piperacillin-tazobactam (ZOSYN) IVPB 3.375 g        3.375 g 100 mL/hr over 30 Minutes Intravenous  Once 10/15/23 1343 10/15/23 1503   10/15/23 1400  DAPTOmycin (CUBICIN) IVPB 500 mg/35mL premix  Status:  Discontinued        8 mg/kg  64.1 kg 100 mL/hr over 30  Minutes Intravenous Daily 10/15/23 1045 10/22/23 1321   10/14/23 1800  vancomycin (VANCOREADY) IVPB 1250 mg/250 mL        1,250 mg 166.7 mL/hr over 90 Minutes Intravenous  Once 10/14/23 1740 10/14/23 2255   10/14/23 1400  piperacillin-tazobactam (ZOSYN) IVPB 3.375 g  Status:  Discontinued        3.375 g 12.5 mL/hr over 240 Minutes Intravenous Every 8 hours 10/14/23 0845 10/22/23 1321   10/14/23 0900  piperacillin-tazobactam (ZOSYN) IVPB 3.375 g        3.375 g 100 mL/hr over 30 Minutes Intravenous NOW 10/14/23 0844 10/14/23 1002   10/05/23 1200  piperacillin-tazobactam (ZOSYN) IVPB 3.375 g        3.375 g 12.5 mL/hr over 240 Minutes Intravenous Every 8 hours 10/05/23 0721 10/09/23 1716   10/04/23 2000  piperacillin-tazobactam (ZOSYN) IVPB 3.375 g  Status:  Discontinued        3.375 g 12.5 mL/hr over 240 Minutes Intravenous Every 8 hours 10/04/23 1827 10/05/23 0721   09/27/23 2315  piperacillin-tazobactam (ZOSYN) IVPB 3.375 g  Status:  Discontinued        3.375 g 12.5 mL/hr over 240 Minutes Intravenous Every 8 hours 09/27/23 2305 10/02/23 1106       Assessment/Plan: POD 30 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 [name] - CT 10/24 with hematoma in the pelvis adjacent to small bowel anastomosis  and ileus vs obstruction  - IR 11/1: drain placement for small bowel anastomotic leak/ abscess drainage - CT 11/8 with resolution in previous right hemipelvic collection and no leakage of enteric contrast - drain injection study 11/11 confirms fistulous connection - drain left in place. Stop flushing drain and placed to gravity.  Anticipate drain study next week - reg diet and TPN, supplements; appreciate ongoing care from dietitian.  Wean TPN when able. Looks like her PO intake was slightly improved yesterday - getting in enough nutritionally will be key to trying to heal fistula which fortunately appears to be low output at this point - continue daily dressing  changes  - Needs mobilization and physical conditioning   FEN: PICC/TPN; reg diet  VTE: IVC Filter  ID: no current abx     ABL anemia secondary to post-op hematomas and hematuria  Acute PE and LLE DVT Pancreatic tail cyst Thyroid nodule  Chronic HFpEF Paroxysmal A. Fib Pre-diabetes RLS    Elizabeth Odom 10/31/2023

## 2023-11-01 DIAGNOSIS — K56609 Unspecified intestinal obstruction, unspecified as to partial versus complete obstruction: Secondary | ICD-10-CM | POA: Diagnosis not present

## 2023-11-01 DIAGNOSIS — R338 Other retention of urine: Secondary | ICD-10-CM | POA: Diagnosis not present

## 2023-11-01 DIAGNOSIS — I824Y2 Acute embolism and thrombosis of unspecified deep veins of left proximal lower extremity: Secondary | ICD-10-CM | POA: Diagnosis not present

## 2023-11-01 DIAGNOSIS — I2699 Other pulmonary embolism without acute cor pulmonale: Secondary | ICD-10-CM | POA: Diagnosis not present

## 2023-11-01 LAB — CBC WITH DIFFERENTIAL/PLATELET
Abs Immature Granulocytes: 0.08 10*3/uL — ABNORMAL HIGH (ref 0.00–0.07)
Basophils Absolute: 0 10*3/uL (ref 0.0–0.1)
Basophils Relative: 1 %
Eosinophils Absolute: 0.2 10*3/uL (ref 0.0–0.5)
Eosinophils Relative: 3 %
HCT: 32.3 % — ABNORMAL LOW (ref 36.0–46.0)
Hemoglobin: 10.5 g/dL — ABNORMAL LOW (ref 12.0–15.0)
Immature Granulocytes: 1 %
Lymphocytes Relative: 31 %
Lymphs Abs: 2 10*3/uL (ref 0.7–4.0)
MCH: 31.3 pg (ref 26.0–34.0)
MCHC: 32.5 g/dL (ref 30.0–36.0)
MCV: 96.4 fL (ref 80.0–100.0)
Monocytes Absolute: 0.6 10*3/uL (ref 0.1–1.0)
Monocytes Relative: 9 %
Neutro Abs: 3.6 10*3/uL (ref 1.7–7.7)
Neutrophils Relative %: 55 %
Platelets: 170 10*3/uL (ref 150–400)
RBC: 3.35 MIL/uL — ABNORMAL LOW (ref 3.87–5.11)
RDW: 16 % — ABNORMAL HIGH (ref 11.5–15.5)
WBC: 6.5 10*3/uL (ref 4.0–10.5)
nRBC: 0 % (ref 0.0–0.2)

## 2023-11-01 LAB — COMPREHENSIVE METABOLIC PANEL
ALT: 47 U/L — ABNORMAL HIGH (ref 0–44)
AST: 30 U/L (ref 15–41)
Albumin: 2.4 g/dL — ABNORMAL LOW (ref 3.5–5.0)
Alkaline Phosphatase: 128 U/L — ABNORMAL HIGH (ref 38–126)
Anion gap: 8 (ref 5–15)
BUN: 27 mg/dL — ABNORMAL HIGH (ref 8–23)
CO2: 23 mmol/L (ref 22–32)
Calcium: 8.9 mg/dL (ref 8.9–10.3)
Chloride: 102 mmol/L (ref 98–111)
Creatinine, Ser: 0.56 mg/dL (ref 0.44–1.00)
GFR, Estimated: >60 mL/min (ref >60)
Glucose, Bld: 116 mg/dL — ABNORMAL HIGH (ref 70–99)
Potassium: 3.7 mmol/L (ref 3.5–5.1)
Sodium: 133 mmol/L — ABNORMAL LOW (ref 135–145)
Total Bilirubin: 0.6 mg/dL (ref <1.2)
Total Protein: 7.7 g/dL (ref 6.5–8.1)

## 2023-11-01 LAB — PHOSPHORUS: Phosphorus: 3.7 mg/dL (ref 2.5–4.6)

## 2023-11-01 LAB — MAGNESIUM: Magnesium: 1.8 mg/dL (ref 1.7–2.4)

## 2023-11-01 MED ORDER — OXYCODONE HCL 5 MG PO TABS
5.0000 mg | ORAL_TABLET | Freq: Four times a day (QID) | ORAL | Status: DC | PRN
  Administered 2023-11-04 – 2023-11-07 (×6): 5 mg via ORAL
  Filled 2023-11-01 (×9): qty 1

## 2023-11-01 MED ORDER — HYDROMORPHONE HCL 1 MG/ML IJ SOLN
0.5000 mg | Freq: Once | INTRAMUSCULAR | Status: AC
  Administered 2023-11-01: 0.5 mg via INTRAVENOUS
  Filled 2023-11-01: qty 0.5

## 2023-11-01 MED ORDER — HYDROMORPHONE HCL 1 MG/ML IJ SOLN
0.5000 mg | Freq: Three times a day (TID) | INTRAMUSCULAR | Status: DC | PRN
  Administered 2023-11-01 – 2023-11-03 (×7): 0.5 mg via INTRAVENOUS
  Filled 2023-11-01 (×7): qty 0.5

## 2023-11-01 MED ORDER — BOOST / RESOURCE BREEZE PO LIQD CUSTOM
1.0000 | Freq: Two times a day (BID) | ORAL | Status: DC
  Administered 2023-11-03: 1 via ORAL
  Administered 2023-11-04: 237 mL via ORAL

## 2023-11-01 NOTE — Progress Notes (Addendum)
31 Days Post-Op   Subjective/Chief Complaint: Complaining of low back pain today.   Objective: Vital signs in last 24 hours: Temp:  [97.6 F (36.4 C)-98.6 F (37 C)] 98.6 F (37 C) (11/21 0541) Pulse Rate:  [66-72] 68 (11/21 0541) Resp:  [16-18] 18 (11/21 0541) BP: (109-125)/(58-67) 109/58 (11/21 0541) SpO2:  [94 %-100 %] 94 % (11/21 0541) Last BM Date : 10/31/23  Intake/Output from previous day: 11/20 0701 - 11/21 0700 In: 1680.8 [P.O.:720; I.V.:950.8] Out: 1900 [Urine:1800; Stool:100] Intake/Output this shift: No intake/output data recorded.  Alert, calm Unlabored respirations Abdomen is soft, nondistended, nontender.  Midline wound is granulating and shallow without any unusual drainage.  Ostomy with dark liquid output.  Drain output with thin brown output; output recorded as 0/ 24 hours  Lab Results:  Recent Labs    11/01/23 0342  WBC 6.5  HGB 10.5*  HCT 32.3*  PLT 170    BMET Recent Labs    11/01/23 0342  NA 133*  K 3.7  CL 102  CO2 23  GLUCOSE 116*  BUN 27*  CREATININE 0.56  CALCIUM 8.9   PT/INR No results for input(s): "LABPROT", "INR" in the last 72 hours. ABG No results for input(s): "PHART", "HCO3" in the last 72 hours.  Invalid input(s): "PCO2", "PO2"  Studies/Results: No results found.  Anti-infectives: Anti-infectives (From admission, onward)    Start     Dose/Rate Route Frequency Ordered Stop   10/15/23 1800  vancomycin (VANCOCIN) IVPB 1000 mg/200 mL premix  Status:  Discontinued        1,000 mg 200 mL/hr over 60 Minutes Intravenous Every 24 hours 10/14/23 1740 10/15/23 1045   10/15/23 1430  piperacillin-tazobactam (ZOSYN) IVPB 3.375 g        3.375 g 100 mL/hr over 30 Minutes Intravenous  Once 10/15/23 1343 10/15/23 1503   10/15/23 1400  DAPTOmycin (CUBICIN) IVPB 500 mg/23mL premix  Status:  Discontinued        8 mg/kg  64.1 kg 100 mL/hr over 30 Minutes Intravenous Daily 10/15/23 1045 10/22/23 1321   10/14/23 1800  vancomycin  (VANCOREADY) IVPB 1250 mg/250 mL        1,250 mg 166.7 mL/hr over 90 Minutes Intravenous  Once 10/14/23 1740 10/14/23 2255   10/14/23 1400  piperacillin-tazobactam (ZOSYN) IVPB 3.375 g  Status:  Discontinued        3.375 g 12.5 mL/hr over 240 Minutes Intravenous Every 8 hours 10/14/23 0845 10/22/23 1321   10/14/23 0900  piperacillin-tazobactam (ZOSYN) IVPB 3.375 g        3.375 g 100 mL/hr over 30 Minutes Intravenous NOW 10/14/23 0844 10/14/23 1002   10/05/23 1200  piperacillin-tazobactam (ZOSYN) IVPB 3.375 g        3.375 g 12.5 mL/hr over 240 Minutes Intravenous Every 8 hours 10/05/23 0721 10/09/23 1716   10/04/23 2000  piperacillin-tazobactam (ZOSYN) IVPB 3.375 g  Status:  Discontinued        3.375 g 12.5 mL/hr over 240 Minutes Intravenous Every 8 hours 10/04/23 1827 10/05/23 0721   09/27/23 2315  piperacillin-tazobactam (ZOSYN) IVPB 3.375 g  Status:  Discontinued        3.375 g 12.5 mL/hr over 240 Minutes Intravenous Every 8 hours 09/27/23 2305 10/02/23 1106       Assessment/Plan: POD 31 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 [name] - CT 10/24 with hematoma in the pelvis adjacent to small bowel anastomosis and ileus vs obstruction  - IR 11/1: drain  placement for small bowel anastomotic leak/ abscess drainage - CT 11/8 with resolution in previous right hemipelvic collection and no leakage of enteric contrast - drain injection study 11/11 confirms fistulous connection - drain left in place. Stop flushing drain and placed to gravity.  Anticipate drain study next week - reg diet and TPN, supplements; appreciate ongoing care from dietitian.  Wean TPN when able. Looks like her PO intake was slightly improved for last 2 days - getting in enough nutritionally will be key to trying to heal fistula which fortunately appears to be low output at this point - continue daily dressing changes  - Needs mobilization and physical conditioning -Wean off IV  medication/narcotics   FEN: PICC/TPN; reg diet -appreciate ongoing care from dietitian, okay to wean TPN from my standpoint will ask pharmacy to halve the next round Void trial this week? VTE: IVC Filter  ID: no current abx     ABL anemia secondary to post-op hematomas and hematuria  Acute PE and LLE DVT Pancreatic tail cyst Thyroid nodule  Chronic HFpEF Paroxysmal A. Fib Pre-diabetes RLS    Elizabeth Odom 11/01/2023

## 2023-11-01 NOTE — Progress Notes (Signed)
PHARMACY - TOTAL PARENTERAL NUTRITION CONSULT NOTE   Indication:  Prolonged ileus  Patient Measurements: Height: 5\' 5"  (165.1 cm) Weight: 59.9 kg (132 lb) IBW/kg (Calculated) : 57 TPN AdjBW (KG): 62 Body mass index is 21.97 kg/m. Usual Weight: 64.5 on 10/04/2023  Recent Labs    11/01/23 0342  NA 133*  K 3.7  CL 102  CO2 23  GLUCOSE 116*  BUN 27*  CREATININE 0.56  CALCIUM 8.9  PHOS 3.7  MG 1.8  ALBUMIN 2.4*  ALKPHOS 128*  AST 30  ALT 47*  BILITOT 0.6     Assessment:  TPN per pharmacy consult for this 82 yo female admitted on 09/27/2023 with recurrent abdominal pain, nausea, emesis, diarrhea and intolerance of oral intake. Surgery completed exp lap with small bowel resection and Hartman's resection and colostomy on 10/01/2023.  Post-op ileus, 10/31 imaging showed anastomotic leak - drain placed by IR on 11/1 for abscess.  Glucose / Insulin: No hx DM.  - CBG <150 - SSI discontinued Electrolytes: Na low but stable at 133, K 3.7; Mag 1.8; Others WNL including CorrCa   Renal: SCr < 1, BUN slightly elevated 27 Hepatic: ALT trending down and now slightly elevated at 47, Alk Phos improving, Tbili wnl - Albumin low - TG improved to WNL Intake / Output; MIVF: no mIVF - I/O:  - 219 mL - UOP: - 219 mL.  - Drain output: 0 mL  - ostomy: 100 mL GI Imaging: 10/10 CT A/P: small bowel enteritis/colitis that was also involving the sigmoid colon. No bowel obstruction seen.  10/17 CT A/P: wall thickening sigmoid colon worrisome for neoplasm. Focal colitis not excluded. Colonic dilation compatible with obstruction.  Cystic mass at the tail of the pancreas.  10/18 MR - 9 mm cystic lesion within pancreatic body/tail junction, colonic obstruction 10/31 CT A/P - colostomy and Hartmann's pouch seen.  Appears to be extravasation of contrast suggesting perforation or leakage. 11/8 CT A/P: No persistent anastomotic leak or fistula. GI Surgeries / Procedures:  10/19 flexible  sigmoidoscopy 10/21 colectomy with colostomy creation/Hartman procedure 11/1: CT guided visceral fluid drain by perc cath  Central access: PICC line placed 11/1 TPN start date: 11/1  Nutritional Goals: Due to the Baxter IV fluid disruption, we will be switching all adult compounded parenteral nutrition to premade Clinimix products +/- fat emulsion infusion. Our goal will be to continue providing as close to 100% of our patient's nutritional needs. Due to the volatile availability of different Clinimix concentrations we may not be able to meet 100% of adult patient nutritional goals at this time.   TPN: Clinimix E 8/10, 1L x 5 days per week and 2L x 2 days per week with daily lipids ( ) to provide 103 g protein and an average of 1353 kcal per day D10 previously infusing in addition to Clinimix to provide additional kcal, however, discontinued 11/9 given hyponatremia  RD Assessment:  Estimated Needs Total Energy Estimated Needs: 1900-2100 Total Protein Estimated Needs: 90-105g Total Fluid Estimated Needs: 2L/day  Current Nutrition:  TPN Diet: Regular. Calorie count previously with inadequate intake Mighty Shake BID (each supp provides 220 kcal, 6 g protein)   Plan:  - Per Dr. Fredricka Bonine via [place] msg, ok to d/c TPN when current bag runs out at 6p today - pharmacy will sign off for TPN. Re-consult [place] if need further assistance.   Lucia Gaskins, PharmD, BCPS Clinical Pharmacist 11/01/2023 8:07 AM

## 2023-11-01 NOTE — Progress Notes (Addendum)
The patient complained pf abdominal pain/discomfort 10/10. Hospitalist contacted, V.O received to administer Dilaudid 0.5 mg IV once now.

## 2023-11-01 NOTE — TOC Progression Note (Signed)
Transition of Care [facility]) - Progression Note    Patient Details  Name: Elizabeth Odom MRN: [id number] Date of Birth: 26-Feb-1941  [facility] CM/SW Contact  Erin Sons, [place] Phone Number: 11/01/2023, 3:28 PM  Clinical Narrative:     Pt stopping TPN today. CSW resent referrals in hub.   Expected Discharge Plan: Skilled Nursing Facility Barriers to Discharge: Continued Medical Work up, English as a second language teacher, SNF Pending bed offer  Expected Discharge Plan and Services In-house Referral: NA Discharge Planning Services: CM Consult Post Acute Care Choice: Home Health Living arrangements for the past 2 months: Apartment                 DME Arranged: N/A DME Agency: NA       HH Arranged: PT, RN HH Agency: [facility] Date HH Agency Contacted: 10/02/23 Time HH Agency Contacted: 1550 Representative spoke with at [facility] Agency: Morrie Sheldon   Social Determinants of Health (SDOH) Interventions SDOH Screenings   Food Insecurity: No Food Insecurity (09/28/2023)  Housing: Low Risk  (09/28/2023)  Transportation Needs: No Transportation Needs (09/28/2023)  Utilities: Not At Risk (09/28/2023)  Tobacco Use: Low Risk  (10/04/2023)    Readmission Risk Interventions    10/19/2023    6:13 PM  Readmission Risk Prevention Plan  Transportation Screening Complete  PCP or Specialist Appt within 5-7 Days Complete  Home Care Screening Complete  Medication Review (RN CM) Complete

## 2023-11-01 NOTE — Progress Notes (Signed)
PROGRESS NOTE    Elizabeth Odom  NGE:[id number] DOB: 20-Apr-1941 DOA: 09/27/2023 PCP: [name], FNP    Chief Complaint  Patient presents with   Abdominal Pain    Brief Narrative:  82 year old with past medical history significant for A-fib, RLS, anemia presented to the ED 10/17 with recurrent abdominal pain, nausea emesis, diarrhea intolerance of oral intake.  She was evaluated in the ED 10/10 for abdominal pain, decreased appetite, fever, diagnosed with possible colitis noted on CT abdomen and pelvis discharged on Augmentin.  She had recurrence of symptoms and presented back to the hospital.  CT initially showed proximal sigmoid colon wall thickening with upstream colonic dilation with air-fluid levels and large amount of stool compatible with obstruction.  9 mm cystic lesion in the tail of the pancreas and subcentimeter lucent lesion in L1 vertebral body.  GI consulted, patient refused colonoscopy but agreed to sigmoidoscopy showing stricture versus mass at the rectosigmoid region.  Scope could not be advanced past this.  Surgery was consulted and she underwent a small bowel resection and Hartman's resection and  colostomy on 10/01/2023.  She continued to have postoperative ileus managed by general surgery.  NG remains in place to suction.   Hospital course also complicated by acute PE and left lower extremity DVT she was started on heparin.  She subsequently developed acute blood loss anemia and subsequent pelvic hematoma near the small bowel anastomosis as well as notable hematuria.  Urology was consulted.  Signed off after hematuria resolution.  Given high risk for bleeding with notable PE DVT evaluated by IR and placed IVC filter 10/05/2023.  Patient required total of 4 units of packed red blood cell.   As her bowel function did not return she underwent a repeat CT abdomen and pelvis on 10/31st showing anastomotic leak into 7 x 6 cm complex fluid collection posteriorly in the pelvis.   IR was consulted and underwent CT-guided percutaneous drain placement into pelvic abscess.  Fluid grew MRSA.  ID consulted and once transitioned to daptomycin and Zosyn.   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  ID recommended no further need of antibiotics.    Needs to be wean off TPN to be able to go to SNF   Assessment & Plan:   Principal Problem:   Large bowel obstruction (HCC) Active Problems:   Acute colitis   Left leg DVT (HCC)   ABLA (acute blood loss anemia)   Palliative care encounter   Counseling and coordination of care   Need for emotional support   Goals of care, counseling/discussion   High risk medication use   Pain   Medication management   Pressure injury of skin   Malnutrition of moderate degree   Iron deficiency anemia due to chronic blood loss   Acute pulmonary embolism (HCC)   Hyponatremia   Acute urinary retention   #1 large bowel obstruction status post Hartman's resection, colostomy complicated by anastomosis leak of pelvic abscess requiring drain placement/fistula connection with colon staple line anastomosis/postop ileus versus partial small bowel obstruction -Patient noted to have presented abdominal pain with CT abdomen and pelvis done 09/27/2023 with proximal sigmoid colon thickening. -Patient seen in consultation by GI underwent sigmoidoscopy 09/29/2023 which showed a tight stricture found to have a mass, biopsy was negative for malignancy suggesting inflammation. -General Surgery consulted status post expiratory laparotomy Hartman's resection 10/01/2023 with small bowel resection and ostomy per Dr. [name]. -Repeat  CT abdomen and pelvis 10/11/2023 with small leak at the anastomosis going into 6.7 cm fluid collection. -Patient seen in consultation by IR underwent drain catheter placement of pelvic abscess on 10/12/2023 with cultures growing MRSA. -Patient seen in consultation  by ID and treated with daptomycin and Zosyn. -Repeat CT abdomen and pelvis 10/18/2023 with percutaneous peak tail drain catheter with no persistent anastomotic leak or fistula. -10/22/2023 pelvic drain injection study showed fistulous communication with colon/staple line anastomosis with drain to remain in place. -Per ID no further indication for antibiotics antibiotics subsequently discontinued. -Currently on TPN which has been weaned per general surgery recommendations. -Diet advanced to regular diet which patient seems to be tolerating. -Continue daily dressings. -Mobilization. -Per general surgery.  2.  Hyponatremia -Was on D10 which was subsequently discontinued with improvement with hyponatremia. -Sodium at 133.  3.  Acute blood loss anemia status post perianastomotic hematoma with hematuria -Posttransfusion 4 units PRBCs during the hospitalization. -Status post IVC filter. -Seen in consultation by urology no indication for cystoscopy or intervention at this time. -Hemoglobin currently stabilized at 10.5. -Follow H&H.  4.  Acute PE (small to moderate clot burden) without right heart strain/acute left lower extremity DVT -Patient noted unable to tolerate anticoagulation status post IVC filter placement 10/05/2023. -2D echo with no evidence of right heart strain. -CT scan with no evidence of right heart strain. -Outpatient follow-up with PCP.  5.  Thyroid nodule in the inferior aspect of the isthmus -Outpatient follow-up.  6.  Paroxysmal atrial fibrillation -Currently rate controlled. -Not on anticoagulation secondary to problem #3.  7.  Hypokalemia/hypophosphatemia/hypomagnesemia -On TPN which is being weaned. -Electrolytes being replaced per pharmacy.  8.  Urinary retention -Urine output of 1.8 L over the past 24 hours. -Continue Foley catheter.  9.  Hypotension -Resolved.  10.  Leukocytosis -Resolved.  11.  Moderate protein calorie malnutrition -Currently on TPN  which is being weaned.  General surgery. -Diet advanced.  12.  Pressure injury right Botox stage II/MASD Pressure Injury 10/08/23 Buttocks Right;Mid Stage 2 -  Partial thickness loss of dermis presenting as a shallow open injury with a red, pink wound bed without slough. (Active)  10/08/23 0800  Location: Buttocks  Location Orientation: Right;Mid  Staging: Stage 2 -  Partial thickness loss of dermis presenting as a shallow open injury with a red, pink wound bed without slough.  Wound Description (Comments):   Present on Admission:         DVT prophylaxis: SCDs Code Status: DNR Family Communication: Updated patient.  No family at bedside. Disposition: SNF once weaned off TPN and cleared by general surgery.  Status is: Inpatient Remains inpatient appropriate because: Severity of illness   Consultants:  Gastroenterology: Dr. Lorenso Quarry 09/28/2023 General Surgery: Dr. [name] 09/29/2023 Wound care consult PCCM: Dr. Jamison Neighbor 10/04/2023 Urology: Dr. Cardell Peach 10/05/2023 Interventional radiology: Dr. Deanne Coffer 10/05/2023 Palliative care: Dr. Patterson Hammersmith 10/05/2023 Interventional radiology: Dr.Mugweru 10/12/2023 ID: Dr. Thedore Mins 10/13/2023   Procedures:  Flexible sigmoidoscopy per Dr. Marca Ancona GI 09/29/2023 IVC gram and IV filter placement per IR: Dr. Deanne Coffer 10/05/2023 Drainage catheter placement into pelvic abscess per IR: Dr.Mugweru 10/12/2023 CT abdomen and pelvis 09/27/2023, 10/11/2023, 10/19/2023 CT chest 09/30/2023 MRI abdomen 09/28/2023 Drain injection/fistulogram/abscessogram per IR 10/22/2023 2D echo 10/01/2023 Lower extremity Dopplers 10/04/2023 CT abdomen and pelvis without contrast 10/04/2023 Sigmoid colectomy with colostomy creation/Hartman's procedure/small bowel resection with anastomosis: Per general surgery: Dr. [name] 10/01/2023 Transfusion 4 units PRBCs  Antimicrobials:  Anti-infectives (From admission, onward)    Start  Dose/Rate Route Frequency Ordered Stop   10/15/23 1800   vancomycin (VANCOCIN) IVPB 1000 mg/200 mL premix  Status:  Discontinued        1,000 mg 200 mL/hr over 60 Minutes Intravenous Every 24 hours 10/14/23 1740 10/15/23 1045   10/15/23 1430  piperacillin-tazobactam (ZOSYN) IVPB 3.375 g        3.375 g 100 mL/hr over 30 Minutes Intravenous  Once 10/15/23 1343 10/15/23 1503   10/15/23 1400  DAPTOmycin (CUBICIN) IVPB 500 mg/54mL premix  Status:  Discontinued        8 mg/kg  64.1 kg 100 mL/hr over 30 Minutes Intravenous Daily 10/15/23 1045 10/22/23 1321   10/14/23 1800  vancomycin (VANCOREADY) IVPB 1250 mg/250 mL        1,250 mg 166.7 mL/hr over 90 Minutes Intravenous  Once 10/14/23 1740 10/14/23 2255   10/14/23 1400  piperacillin-tazobactam (ZOSYN) IVPB 3.375 g  Status:  Discontinued        3.375 g 12.5 mL/hr over 240 Minutes Intravenous Every 8 hours 10/14/23 0845 10/22/23 1321   10/14/23 0900  piperacillin-tazobactam (ZOSYN) IVPB 3.375 g        3.375 g 100 mL/hr over 30 Minutes Intravenous NOW 10/14/23 0844 10/14/23 1002   10/05/23 1200  piperacillin-tazobactam (ZOSYN) IVPB 3.375 g        3.375 g 12.5 mL/hr over 240 Minutes Intravenous Every 8 hours 10/05/23 0721 10/09/23 1716   10/04/23 2000  piperacillin-tazobactam (ZOSYN) IVPB 3.375 g  Status:  Discontinued        3.375 g 12.5 mL/hr over 240 Minutes Intravenous Every 8 hours 10/04/23 1827 10/05/23 0721   09/27/23 2315  piperacillin-tazobactam (ZOSYN) IVPB 3.375 g  Status:  Discontinued        3.375 g 12.5 mL/hr over 240 Minutes Intravenous Every 8 hours 09/27/23 2305 10/02/23 1106         Subjective: Patient laying in bed sleeping but easily arousable.  Some complaints of abdominal pain however stated just received some pain medication and states surgeon states abdominal pain is not unusual postoperatively.  Patient passing flatus.  States ostomy with good output.  Denies any chest pain or shortness of breath.   Objective: Vitals:   10/31/23 0550 10/31/23 1307 10/31/23 1932 11/01/23  0541  BP: 118/62 120/66 125/67 (!) 109/58  Pulse: 68 66 72 68  Resp: 18 16 18 18   Temp: 99.3 F (37.4 C) 97.6 F (36.4 C) 98.2 F (36.8 C) 98.6 F (37 C)  TempSrc: Oral Oral Oral Oral  SpO2: 95% 100% 98% 94%  Weight:      Height:        Intake/Output Summary (Last 24 hours) at 11/01/2023 1203 Last data filed at 11/01/2023 1000 Gross per 24 hour  Intake 1525.44 ml  Output 2250 ml  Net -724.56 ml   Filed Weights   10/25/23 0713 10/28/23 0753 10/31/23 0500  Weight: 62.2 kg 58.2 kg 59.9 kg    Examination:  General exam: NAD. Respiratory system: CTAB anterior lung fields.  No wheezes, no crackles, no rhonchi.  Fair air movement.  Speaking in full sentences.   Cardiovascular system: Regular rate rhythm no murmurs rubs or gallops.  No JVD.  No lower extremity edema.  Gastrointestinal system: Abdomen is soft, nondistended, some diffuse tenderness to palpation.  Bandage over the mid abdominal wound.  Ostomy with dark brown stool noted.  Positive bowel sounds.  Central nervous system: Alert and oriented. No focal neurological deficits. Extremities: Symmetric 5 x 5 power.  Skin: No rashes, lesions or ulcers Psychiatry: Judgement and insight appear normal. Mood & affect appropriate.     Data Reviewed: I have personally reviewed following labs and imaging studies  CBC: Recent Labs  Lab 10/27/23 0312 11/01/23 0342  WBC 7.0 6.5  NEUTROABS  --  3.6  HGB 10.0* 10.5*  HCT 30.9* 32.3*  MCV 96.0 96.4  PLT 219 170    Basic Metabolic Panel: Recent Labs  Lab 10/27/23 0312 10/28/23 1120 10/29/23 0327 11/01/23 0342  NA 131* 132* 130* 133*  K 3.7 3.9 3.8 3.7  CL 104 103 101 102  CO2 21* 23 23 23   GLUCOSE 111* 123* 112* 116*  BUN 26* 21 20 27*  CREATININE 0.46 0.44 0.48 0.56  CALCIUM 8.6* 8.6* 8.5* 8.9  MG 1.9  --  1.9 1.8  PHOS 3.3  --  3.0 3.7    GFR: Estimated Creatinine Clearance: 47.9 mL/min (by C-G formula based on SCr of 0.56 mg/dL).  Liver Function  Tests: Recent Labs  Lab 10/29/23 0327 11/01/23 0342  AST 36 30  ALT 58* 47*  ALKPHOS 152* 128*  BILITOT 0.4 0.6  PROT 7.3 7.7  ALBUMIN 2.4* 2.4*    CBG: No results for input(s): "GLUCAP" in the last 168 hours.   No results found for this or any previous visit (from the past 240 hour(s)).       Radiology Studies: No results found.      Scheduled Meds:  Chlorhexidine Gluconate Cloth  6 each Topical Daily   docusate sodium  100 mg Oral BID   Gerhardt's butt cream   Topical BID   ketotifen  2 drop Both Eyes BID   multivitamin with minerals  1 tablet Oral Daily   polyethylene glycol  17 g Oral Daily   sodium chloride flush  10-40 mL Intracatheter Q12H   Continuous Infusions:  TPN (CLINIMIX-E) Adult 41 mL/hr at 10/31/23 1757     LOS: 35 days    Time spent: 35 minutes    Ramiro Harvest, MD [facility]   To contact the attending provider between 7A-7P or the covering provider during after hours 7P-7A, please log into the web site [email or web address] and access using universal [place] password for that web site. If you do not have the password, please call the hospital operator.  11/01/2023, 12:03 PM

## 2023-11-01 NOTE — Progress Notes (Signed)
Nutrition Follow-up  DOCUMENTATION CODES:   Non-severe (moderate) malnutrition in context of chronic illness  INTERVENTION:  - Per [facility], plan to stop TPN today.   - Regular diet.  - Boost Breeze po BID, each supplement provides 250 kcal and 9 grams of protein             - Patient has tried Ensure, Boost Plus, Magic Cup, [employer] and ProSource Plus and disliked them all. - Encourage intake at all meals and of supplements.    - Daily weights.   NUTRITION DIAGNOSIS:   Moderate Malnutrition related to chronic illness as evidenced by moderate fat depletion, severe muscle depletion. *ongoing  GOAL:   Patient will meet greater than or equal to 90% of their needs *progressing  MONITOR:   PO intake, Supplement acceptance, Diet advancement, Labs, Weight trends, I & O's  REASON FOR ASSESSMENT:   Consult New TPN/TNA  ASSESSMENT:   82 year old F with PMH of A-fib, RLS and anemia presented to ED on 10/17 with recurrent abdominal pain, nausea, emesis, diarrhea and intolerance of oral intake.  Patient was seen in ED on 10/10 for abdominal pain, decreased appetite and fever and discharged on Augmentin for possible colitis noted on CT abdomen and pelvis.  10/18: admitted 10/19: s/p flex sigmoidoscopy 10/21: s/p colectomy w/ colostomy 10/23: NPO 10/31: CLD -> NPO 11/1: TPN started 11/5: CLD 11/8: FLD 11/11: Soft diet 11/12: Calorie count started 11/13 Regular diet 11/14 Calorie count complete: meeting 23% kcal needs and 15% protein needs 11/15 2nd calorie count started, meeting average of 26% kcal needs and 29% protein needs 11/21 TPN discontinued  Patient reports eating a little better over the past few days. Trying to eat 3 meals and doing better with eating more at each meal. Appetite still decreased but improving. She is documented to be consuming 25-100% of meals. Had 100% of dinner last night and breakfast this morning.  Unfortunately she did not like Mighty Shake. She  has now tried almost every supplement on formulary. However, when reviewing what she had tried she did not remember trying Boost Breeze. Having a non-milky tasting supplement appealed to her so will retrial Boost Breeze.   Per [facility], plan to stop TPN today due to improved oral intake.  Stressed importance of continuing to eat 3 meals a day and working on increasing intake with each meal now that off TPN.  Patient endorsed understanding. Motivated to continue to eat well to get out of the hospital.   Admit weight: 124# Current weight: 132# I&O's: +1L  Medications reviewed and include: Colace, Miralax, MVI  Labs reviewed:  Na 133   Diet Order:   Diet Order             Diet regular Room service appropriate? Yes; Fluid consistency: Thin  Diet effective now                   EDUCATION NEEDS:  Education needs have been addressed  Skin:  Skin Assessment: Skin Integrity Issues: Skin Integrity Issues:: Stage II, Incisions Stage II: mid sacrum Incisions: 10/21 abdomen  Last BM:  11/21 - colostomy  Height:  Ht Readings from Last 1 Encounters:  10/04/23 5\' 5"  (1.651 m)   Weight:  Wt Readings from Last 1 Encounters:  10/31/23 59.9 kg   BMI:  Body mass index is 21.97 kg/m.  Estimated Nutritional Needs:  Kcal:  1900-2100 Protein:  90-105g Fluid:  2L/day    Shelle Iron RD, LDN For contact  information, refer to [facility].

## 2023-11-02 DIAGNOSIS — I824Y2 Acute embolism and thrombosis of unspecified deep veins of left proximal lower extremity: Secondary | ICD-10-CM | POA: Diagnosis not present

## 2023-11-02 DIAGNOSIS — R338 Other retention of urine: Secondary | ICD-10-CM | POA: Diagnosis not present

## 2023-11-02 DIAGNOSIS — K56609 Unspecified intestinal obstruction, unspecified as to partial versus complete obstruction: Secondary | ICD-10-CM | POA: Diagnosis not present

## 2023-11-02 DIAGNOSIS — I2699 Other pulmonary embolism without acute cor pulmonale: Secondary | ICD-10-CM | POA: Diagnosis not present

## 2023-11-02 LAB — PHOSPHORUS: Phosphorus: 4 mg/dL (ref 2.5–4.6)

## 2023-11-02 LAB — COMPREHENSIVE METABOLIC PANEL
ALT: 51 U/L — ABNORMAL HIGH (ref 0–44)
AST: 35 U/L (ref 15–41)
Albumin: 2.4 g/dL — ABNORMAL LOW (ref 3.5–5.0)
Alkaline Phosphatase: 124 U/L (ref 38–126)
Anion gap: 7 (ref 5–15)
BUN: 30 mg/dL — ABNORMAL HIGH (ref 8–23)
CO2: 24 mmol/L (ref 22–32)
Calcium: 9.1 mg/dL (ref 8.9–10.3)
Chloride: 103 mmol/L (ref 98–111)
Creatinine, Ser: 0.52 mg/dL (ref 0.44–1.00)
GFR, Estimated: >60 mL/min (ref >60)
Glucose, Bld: 100 mg/dL — ABNORMAL HIGH (ref 70–99)
Potassium: 4 mmol/L (ref 3.5–5.1)
Sodium: 134 mmol/L — ABNORMAL LOW (ref 135–145)
Total Bilirubin: 0.9 mg/dL (ref <1.2)
Total Protein: 7.6 g/dL (ref 6.5–8.1)

## 2023-11-02 LAB — CBC
HCT: 34.5 % — ABNORMAL LOW (ref 36.0–46.0)
Hemoglobin: 11 g/dL — ABNORMAL LOW (ref 12.0–15.0)
MCH: 31.2 pg (ref 26.0–34.0)
MCHC: 31.9 g/dL (ref 30.0–36.0)
MCV: 97.7 fL (ref 80.0–100.0)
Platelets: 173 10*3/uL (ref 150–400)
RBC: 3.53 MIL/uL — ABNORMAL LOW (ref 3.87–5.11)
RDW: 15.9 % — ABNORMAL HIGH (ref 11.5–15.5)
WBC: 6.9 10*3/uL (ref 4.0–10.5)
nRBC: 0 % (ref 0.0–0.2)

## 2023-11-02 LAB — MAGNESIUM: Magnesium: 1.8 mg/dL (ref 1.7–2.4)

## 2023-11-02 NOTE — Progress Notes (Signed)
PROGRESS NOTE    Elizabeth DEFREECE  WUJ:[id number] DOB: [birth year] DOA: 09/27/2023 PCP: [name], FNP    Chief Complaint  Patient presents with   Abdominal Pain    Brief Narrative:  82 year old with past medical history significant for A-fib, RLS, anemia presented to the ED 10/17 with recurrent abdominal pain, nausea emesis, diarrhea intolerance of oral intake.  She was evaluated in the ED 10/10 for abdominal pain, decreased appetite, fever, diagnosed with possible colitis noted on CT abdomen and pelvis discharged on Augmentin.  She had recurrence of symptoms and presented back to the hospital.  CT initially showed proximal sigmoid colon wall thickening with upstream colonic dilation with air-fluid levels and large amount of stool compatible with obstruction.  9 mm cystic lesion in the tail of the pancreas and subcentimeter lucent lesion in L1 vertebral body.  GI consulted, patient refused colonoscopy but agreed to sigmoidoscopy showing stricture versus mass at the rectosigmoid region.  Scope could not be advanced past this.  Surgery was consulted and she underwent a small bowel resection and Hartman's resection and  colostomy on 10/01/2023.  She continued to have postoperative ileus managed by general surgery.  NG remains in place to suction.   Hospital course also complicated by acute PE and left lower extremity DVT she was started on heparin.  She subsequently developed acute blood loss anemia and subsequent pelvic hematoma near the small bowel anastomosis as well as notable hematuria.  Urology was consulted.  Signed off after hematuria resolution.  Given high risk for bleeding with notable PE DVT evaluated by IR and placed IVC filter 10/05/2023.  Patient required total of 4 units of packed red blood cell.   As her bowel function did not return she underwent a repeat CT abdomen and pelvis on 10/31st showing anastomotic leak into 7 x 6 cm complex fluid collection posteriorly in the pelvis.   IR was consulted and underwent CT-guided percutaneous drain placement into pelvic abscess.  Fluid grew MRSA.  ID consulted and once transitioned to daptomycin and Zosyn.   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  ID recommended no further need of antibiotics.    Needs to be wean off TPN to be able to go to SNF   Assessment & Plan:   Principal Problem:   Large bowel obstruction (HCC) Active Problems:   Acute colitis   Left leg DVT (HCC)   ABLA (acute blood loss anemia)   Palliative care encounter   Counseling and coordination of care   Need for emotional support   Goals of care, counseling/discussion   High risk medication use   Pain   Medication management   Pressure injury of skin   Malnutrition of moderate degree   Iron deficiency anemia due to chronic blood loss   Acute pulmonary embolism (HCC)   Hyponatremia   Acute urinary retention   #1 large bowel obstruction status post Hartman's resection, colostomy complicated by anastomosis leak of pelvic abscess requiring drain placement/fistula connection with colon staple line anastomosis/postop ileus versus partial small bowel obstruction -Patient noted to have presented abdominal pain with CT abdomen and pelvis done 09/27/2023 with proximal sigmoid colon thickening. -Patient seen in consultation by GI underwent sigmoidoscopy 09/29/2023 which showed a tight stricture found to have a mass, biopsy was negative for malignancy suggesting inflammation. -General Surgery consulted status post expiratory laparotomy Hartman's resection 10/01/2023 with small bowel resection and ostomy per Dr. [name]. -Repeat  CT abdomen and pelvis 10/11/2023 with small leak at the anastomosis going into 6.7 cm fluid collection. -Patient seen in consultation by IR underwent drain catheter placement of pelvic abscess on 10/12/2023 with cultures growing MRSA. -Patient seen in consultation  by ID and treated with daptomycin and Zosyn. -Repeat CT abdomen and pelvis 10/18/2023 with percutaneous peak tail drain catheter with no persistent anastomotic leak or fistula. -10/22/2023 pelvic drain injection study showed fistulous communication with colon/staple line anastomosis with drain to remain in place. -Per ID no further indication for antibiotics and antibiotics subsequently discontinued. -Currently on TPN which has been weaned off per general surgery recommendations. -Diet advanced to regular diet which patient seems to be tolerating. -Continue daily dressings. -Mobilization. -Per general surgery.  2.  Hyponatremia -Was on D10 which was subsequently discontinued with improvement with hyponatremia. -Sodium at 134.  3.  Acute blood loss anemia status post perianastomotic hematoma with hematuria -Posttransfusion 4 units PRBCs during the hospitalization. -Status post IVC filter. -Seen in consultation by urology no indication for cystoscopy or intervention at this time. -Hemoglobin currently stabilized at 11. -Follow H&H.  4.  Acute PE (small to moderate clot burden) without right heart strain/acute left lower extremity DVT -Patient noted unable to tolerate anticoagulation status post IVC filter placement 10/05/2023. -2D echo with no evidence of right heart strain. -CT scan with no evidence of right heart strain. -Outpatient follow-up with PCP.  5.  Thyroid nodule in the inferior aspect of the isthmus -Outpatient follow-up.  6.  Paroxysmal atrial fibrillation -Rate controlled.   -Not on anticoagulation secondary to problem #3.  7.  Hypokalemia/hypophosphatemia/hypomagnesemia -On TPN which has been weaned off. -Potassium currently at 4.0, phosphorus at 4.0, magnesium at 1.8.   -Repeat labs in AM.   8.  Urinary retention -Urine output of 1.2 L over the past 24 hours. -Continue Foley catheter.  9.  Hypotension -Resolved.  10.  Leukocytosis -Resolved.  11.   Moderate protein calorie malnutrition -TPN weaned off.  Diet advanced.   12.  Pressure injury right Botox stage II/MASD Pressure Injury 10/08/23 Buttocks Right;Mid Stage 2 -  Partial thickness loss of dermis presenting as a shallow open injury with a red, pink wound bed without slough. (Active)  10/08/23 0800  Location: Buttocks  Location Orientation: Right;Mid  Staging: Stage 2 -  Partial thickness loss of dermis presenting as a shallow open injury with a red, pink wound bed without slough.  Wound Description (Comments):   Present on Admission:         DVT prophylaxis: SCDs Code Status: DNR Family Communication: Updated patient.  No family at bedside. Disposition: SNF once weaned off TPN and cleared by general surgery.  Status is: Inpatient Remains inpatient appropriate because: Severity of illness   Consultants:  Gastroenterology: Dr. Lorenso Quarry 09/28/2023 General Surgery: Dr. [name] 09/29/2023 Wound care consult PCCM: Dr. Jamison Neighbor 10/04/2023 Urology: Dr. Cardell Peach 10/05/2023 Interventional radiology: Dr. Deanne Coffer 10/05/2023 Palliative care: Dr. Patterson Hammersmith 10/05/2023 Interventional radiology: Dr.Mugweru 10/12/2023 ID: Dr. Thedore Mins 10/13/2023   Procedures:  Flexible sigmoidoscopy per Dr. Marca Ancona GI 09/29/2023 IVC gram and IV filter placement per IR: Dr. Deanne Coffer 10/05/2023 Drainage catheter placement into pelvic abscess per IR: Dr.Mugweru 10/12/2023 CT abdomen and pelvis 09/27/2023, 10/11/2023, 10/19/2023 CT chest 09/30/2023 MRI abdomen 09/28/2023 Drain injection/fistulogram/abscessogram per IR 10/22/2023 2D echo 10/01/2023 Lower extremity Dopplers 10/04/2023 CT abdomen and pelvis without contrast 10/04/2023 Sigmoid colectomy with colostomy creation/Hartman's procedure/small bowel resection with anastomosis: Per general surgery: Dr. [name] 10/01/2023 Transfusion 4 units PRBCs  Antimicrobials:  Anti-infectives (From admission, onward)    Start     Dose/Rate Route Frequency Ordered Stop    10/15/23 1800  vancomycin (VANCOCIN) IVPB 1000 mg/200 mL premix  Status:  Discontinued        1,000 mg 200 mL/hr over 60 Minutes Intravenous Every 24 hours 10/14/23 1740 10/15/23 1045   10/15/23 1430  piperacillin-tazobactam (ZOSYN) IVPB 3.375 g        3.375 g 100 mL/hr over 30 Minutes Intravenous  Once 10/15/23 1343 10/15/23 1503   10/15/23 1400  DAPTOmycin (CUBICIN) IVPB 500 mg/65mL premix  Status:  Discontinued        8 mg/kg  64.1 kg 100 mL/hr over 30 Minutes Intravenous Daily 10/15/23 1045 10/22/23 1321   10/14/23 1800  vancomycin (VANCOREADY) IVPB 1250 mg/250 mL        1,250 mg 166.7 mL/hr over 90 Minutes Intravenous  Once 10/14/23 1740 10/14/23 2255   10/14/23 1400  piperacillin-tazobactam (ZOSYN) IVPB 3.375 g  Status:  Discontinued        3.375 g 12.5 mL/hr over 240 Minutes Intravenous Every 8 hours 10/14/23 0845 10/22/23 1321   10/14/23 0900  piperacillin-tazobactam (ZOSYN) IVPB 3.375 g        3.375 g 100 mL/hr over 30 Minutes Intravenous NOW 10/14/23 0844 10/14/23 1002   10/05/23 1200  piperacillin-tazobactam (ZOSYN) IVPB 3.375 g        3.375 g 12.5 mL/hr over 240 Minutes Intravenous Every 8 hours 10/05/23 0721 10/09/23 1716   10/04/23 2000  piperacillin-tazobactam (ZOSYN) IVPB 3.375 g  Status:  Discontinued        3.375 g 12.5 mL/hr over 240 Minutes Intravenous Every 8 hours 10/04/23 1827 10/05/23 0721   09/27/23 2315  piperacillin-tazobactam (ZOSYN) IVPB 3.375 g  Status:  Discontinued        3.375 g 12.5 mL/hr over 240 Minutes Intravenous Every 8 hours 09/27/23 2305 10/02/23 1106         Subjective: Patient laying in bed about to order her lunch.  Denies any chest pain or shortness of breath.  Still with abdominal pain unchanged over the past 24 hours.  Passing gas.  Ostomy with stool output.   Objective: Vitals:   11/01/23 1436 11/01/23 2259 11/02/23 0531 11/02/23 1237  BP: 116/60 111/64 117/61 118/68  Pulse: 70 72 66 88  Resp: 17 18 18 17   Temp: 97.7 F (36.5  C) 98.2 F (36.8 C) 97.9 F (36.6 C) 98 F (36.7 C)  TempSrc: Oral Oral Oral Oral  SpO2: 98% 98% 96% 97%  Weight:      Height:        Intake/Output Summary (Last 24 hours) at 11/02/2023 1320 Last data filed at 11/02/2023 1245 Gross per 24 hour  Intake 1126.47 ml  Output 1550 ml  Net -423.53 ml   Filed Weights   10/25/23 0713 10/28/23 0753 10/31/23 0500  Weight: 62.2 kg 58.2 kg 59.9 kg    Examination:  General exam: NAD. Respiratory system: Lungs clear to auscultation bilaterally anterior lung fields.  No wheezes, no crackles, no rhonchi.  Fair air movement.  Speaking in full sentences.  Cardiovascular system: RRR no murmurs rubs or gallops.  No JVD.  No pitting lower extremity edema.  Gastrointestinal system: Abdomen is soft, nondistended, some diffuse tenderness to palpation.  Bandage over the mid abdominal wound.  Ostomy with dark brown stool noted.  Positive bowel sounds.  Central nervous system: Alert and oriented. No focal neurological deficits. Extremities: Symmetric 5 x 5 power.  Skin: No rashes, lesions or ulcers Psychiatry: Judgement and insight appear normal. Mood & affect appropriate.     Data Reviewed: I have personally reviewed following labs and imaging studies  CBC: Recent Labs  Lab 10/27/23 0312 11/01/23 0342 11/02/23 0425  WBC 7.0 6.5 6.9  NEUTROABS  --  3.6  --   HGB 10.0* 10.5* 11.0*  HCT 30.9* 32.3* 34.5*  MCV 96.0 96.4 97.7  PLT 219 170 173    Basic Metabolic Panel: Recent Labs  Lab 10/27/23 0312 10/28/23 1120 10/29/23 0327 11/01/23 0342 11/02/23 0425  NA 131* 132* 130* 133* 134*  K 3.7 3.9 3.8 3.7 4.0  CL 104 103 101 102 103  CO2 21* 23 23 23 24   GLUCOSE 111* 123* 112* 116* 100*  BUN 26* 21 20 27* 30*  CREATININE 0.46 0.44 0.48 0.56 0.52  CALCIUM 8.6* 8.6* 8.5* 8.9 9.1  MG 1.9  --  1.9 1.8 1.8  PHOS 3.3  --  3.0 3.7 4.0    GFR: Estimated Creatinine Clearance: 47.9 mL/min (by C-G formula based on SCr of 0.52 mg/dL).  Liver  Function Tests: Recent Labs  Lab 10/29/23 0327 11/01/23 0342 11/02/23 0425  AST 36 30 35  ALT 58* 47* 51*  ALKPHOS 152* 128* 124  BILITOT 0.4 0.6 0.9  PROT 7.3 7.7 7.6  ALBUMIN 2.4* 2.4* 2.4*    CBG: No results for input(s): "GLUCAP" in the last 168 hours.   No results found for this or any previous visit (from the past 240 hour(s)).       Radiology Studies: No results found.      Scheduled Meds:  Chlorhexidine Gluconate Cloth  6 each Topical Daily   docusate sodium  100 mg Oral BID   feeding supplement  1 Container Oral BID BM   Gerhardt's butt cream   Topical BID   ketotifen  2 drop Both Eyes BID   multivitamin with minerals  1 tablet Oral Daily   polyethylene glycol  17 g Oral Daily   sodium chloride flush  10-40 mL Intracatheter Q12H   Continuous Infusions:     LOS: 36 days    Time spent: 35 minutes    Ramiro Harvest, MD [facility]   To contact the attending provider between 7A-7P or the covering provider during after hours 7P-7A, please log into the web site [email or web address] and access using universal [place] password for that web site. If you do not have the password, please call the hospital operator.  11/02/2023, 1:20 PM

## 2023-11-02 NOTE — Progress Notes (Signed)
32 Days Post-Op   Subjective/Chief Complaint: Abdominal pain and some bloating today.  Did not get out of bed yesterday, moderate p.o. intake yesterday.  Minimal stool output recorded yesterday.   Objective: Vital signs in last 24 hours: Temp:  [97.7 F (36.5 C)-98.2 F (36.8 C)] 97.9 F (36.6 C) (11/22 0531) Pulse Rate:  [66-72] 66 (11/22 0531) Resp:  [17-18] 18 (11/22 0531) BP: (111-117)/(60-64) 117/61 (11/22 0531) SpO2:  [96 %-98 %] 96 % (11/22 0531) Last BM Date : 11/01/23  Intake/Output from previous day: 11/21 0701 - 11/22 0700 In: 1086.5 [P.O.:600; I.V.:486.5] Out: 1210 [Urine:1200; Stool:10] Intake/Output this shift: Total I/O In: 40 [P.O.:40] Out: -   Alert, calm Unlabored respirations Abdomen is soft, mildly distended, mildly diffusely tender today without peritoneal signs.  Midline wound is granulating and shallow without any unusual drainage.  Ostomy with dark liquid output.  Drain output with thin brown output; output recorded as 0/ 24 hours  Lab Results:  Recent Labs    11/01/23 0342 11/02/23 0425  WBC 6.5 6.9  HGB 10.5* 11.0*  HCT 32.3* 34.5*  PLT 170 173    BMET Recent Labs    11/01/23 0342 11/02/23 0425  NA 133* 134*  K 3.7 4.0  CL 102 103  CO2 23 24  GLUCOSE 116* 100*  BUN 27* 30*  CREATININE 0.56 0.52  CALCIUM 8.9 9.1   PT/INR No results for input(s): "LABPROT", "INR" in the last 72 hours. ABG No results for input(s): "PHART", "HCO3" in the last 72 hours.  Invalid input(s): "PCO2", "PO2"  Studies/Results: No results found.  Anti-infectives: Anti-infectives (From admission, onward)    Start     Dose/Rate Route Frequency Ordered Stop   10/15/23 1800  vancomycin (VANCOCIN) IVPB 1000 mg/200 mL premix  Status:  Discontinued        1,000 mg 200 mL/hr over 60 Minutes Intravenous Every 24 hours 10/14/23 1740 10/15/23 1045   10/15/23 1430  piperacillin-tazobactam (ZOSYN) IVPB 3.375 g        3.375 g 100 mL/hr over 30 Minutes  Intravenous  Once 10/15/23 1343 10/15/23 1503   10/15/23 1400  DAPTOmycin (CUBICIN) IVPB 500 mg/68mL premix  Status:  Discontinued        8 mg/kg  64.1 kg 100 mL/hr over 30 Minutes Intravenous Daily 10/15/23 1045 10/22/23 1321   10/14/23 1800  vancomycin (VANCOREADY) IVPB 1250 mg/250 mL        1,250 mg 166.7 mL/hr over 90 Minutes Intravenous  Once 10/14/23 1740 10/14/23 2255   10/14/23 1400  piperacillin-tazobactam (ZOSYN) IVPB 3.375 g  Status:  Discontinued        3.375 g 12.5 mL/hr over 240 Minutes Intravenous Every 8 hours 10/14/23 0845 10/22/23 1321   10/14/23 0900  piperacillin-tazobactam (ZOSYN) IVPB 3.375 g        3.375 g 100 mL/hr over 30 Minutes Intravenous NOW 10/14/23 0844 10/14/23 1002   10/05/23 1200  piperacillin-tazobactam (ZOSYN) IVPB 3.375 g        3.375 g 12.5 mL/hr over 240 Minutes Intravenous Every 8 hours 10/05/23 0721 10/09/23 1716   10/04/23 2000  piperacillin-tazobactam (ZOSYN) IVPB 3.375 g  Status:  Discontinued        3.375 g 12.5 mL/hr over 240 Minutes Intravenous Every 8 hours 10/04/23 1827 10/05/23 0721   09/27/23 2315  piperacillin-tazobactam (ZOSYN) IVPB 3.375 g  Status:  Discontinued        3.375 g 12.5 mL/hr over 240 Minutes Intravenous Every 8 hours 09/27/23 2305  10/02/23 1106       Assessment/Plan: POD 31 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 [name] - CT 10/24 with hematoma in the pelvis adjacent to small bowel anastomosis and ileus vs obstruction  - IR 11/1: drain placement for small bowel anastomotic leak/ abscess drainage - CT 11/8 with resolution in previous right hemipelvic collection and no leakage of enteric contrast - drain injection study 11/11 confirms fistulous connection - drain left in place. Stop flushing drain and placed to gravity.  Anticipate drain study next week - reg diet, supplements; appreciate ongoing care from dietitian.  P.o. intake has been okay the last 3 days.  TPN holiday. -  getting in enough nutritionally will be key to trying to heal fistula which fortunately appears to be low output at this point - continue daily dressing changes  - Needs mobilization and physical conditioning -Wean off IV medication/narcotics.  She is having more abdominal pain last night and today.  Labs and vitals are reassuring, benign exam.  Continue to monitor.   FEN: reg diet -appreciate ongoing care from dietitian, we will see how her appetite is off the TPN Void trial this week? VTE: IVC Filter  ID: no current abx     ABL anemia secondary to post-op hematomas and hematuria  Acute PE and LLE DVT Pancreatic tail cyst Thyroid nodule  Chronic HFpEF Paroxysmal A. Fib Pre-diabetes RLS    Elizabeth Odom 11/02/2023

## 2023-11-02 NOTE — TOC Progression Note (Signed)
Transition of Care [facility]) - Progression Note    Patient Details  Name: Elizabeth Odom MRN: [id number] Date of Birth: 11-25-1941  Transition of Care [facility]) CM/SW Contact  Erin Sons, [place] Phone Number: 11/02/2023, 2:13 PM  Clinical Narrative:     CSW met with pt this morning and provided SNF bed offers. Pt states her grandaughter is handling everything and requested CSW to discuss with her.   CSW called pt's grandaughter and discussed bed offers. Granddaughter was interested in [facility] though they had initially declined. CSW reached out to [facility] to review pt again and they still denied pt. Grandaughter agreeable to [facility].   [facility] can admit over the weekend once [facility] is approved.   CSW called [facility] and initiated SNF auth request. [facility] is currently pending.    Expected Discharge Plan: Skilled Nursing Facility Barriers to Discharge: Continued Medical Work up, English as a second language teacher, SNF Pending bed offer  Expected Discharge Plan and Services In-house Referral: NA Discharge Planning Services: CM Consult Post Acute Care Choice: Home Health Living arrangements for the past 2 months: Apartment                 DME Arranged: N/A DME Agency: NA       HH Arranged: PT, RN HH Agency: [facility] Date HH Agency Contacted: 10/02/23 Time HH Agency Contacted: 1550 Representative spoke with at [facility] Agency: Morrie Sheldon   Social Determinants of Health (SDOH) Interventions SDOH Screenings   Food Insecurity: No Food Insecurity (09/28/2023)  Housing: Low Risk  (09/28/2023)  Transportation Needs: No Transportation Needs (09/28/2023)  Utilities: Not At Risk (09/28/2023)  Tobacco Use: Low Risk  (10/04/2023)    Readmission Risk Interventions    10/19/2023    6:13 PM  Readmission Risk Prevention Plan  Transportation Screening Complete  PCP or Specialist Appt within 5-7 Days Complete  Home Care Screening Complete  Medication Review (RN  CM) Complete

## 2023-11-02 NOTE — Progress Notes (Signed)
Referring Physician(s): Dr. Gwyndolyn Kaufman   Supervising Physician: Richarda Overlie  Patient Status:  [facility] - In-pt  Chief Complaint:  Pelvic abscess   Subjective:  Patient is alert, though remains somewhat uncomfortable with laying on drain site.  Patient was laying on the drain collection bag on rounds and eating lunch. Site not accesses. No output in bag noted overnight.  Afebrile.   Allergies: Elemental sulfur  Medications: Prior to Admission medications   Medication Sig Start Date End Date Taking? Authorizing Provider  amoxicillin-clavulanate (AUGMENTIN) 875-125 MG tablet Take 1 tablet by mouth every 12 (twelve) hours. 09/21/23  Yes Horton, Kristie M, DO  ondansetron (ZOFRAN-ODT) 4 MG disintegrating tablet Take 4 mg by mouth every 8 (eight) hours as needed for vomiting or nausea. 09/16/23  Yes [provider]  meclizine (ANTIVERT) 25 MG tablet Take 25 mg by mouth 3 (three) times daily. Patient not taking: Reported on 11/23/2020    [provider]  Omega-3 Fatty Acids (FISH OIL) 500 MG CAPS Take 1 capsule by mouth daily. Patient not taking: Reported on 09/27/2023    [provider]     Vital Signs: BP 117/61 (BP Location: Right Arm)   Pulse 66   Temp 97.9 F (36.6 C) (Oral)   Resp 18   Ht 5\' 5"  (1.651 m)   Wt 132 lb (59.9 kg)   SpO2 96%   BMI 21.97 kg/m   Physical Exam  Imaging: No results found.  Labs:  CBC: Recent Labs    10/24/23 0334 10/27/23 0312 11/01/23 0342 11/02/23 0425  WBC 6.9 7.0 6.5 6.9  HGB 10.3* 10.0* 10.5* 11.0*  HCT 32.1* 30.9* 32.3* 34.5*  PLT 265 219 170 173    COAGS: Recent Labs    09/20/23 1902 10/05/23 0247 10/11/23 1255 10/12/23 0655  INR 1.1 1.1 1.1 1.1  APTT  --  30  --   --     BMP: Recent Labs    10/28/23 1120 10/29/23 0327 11/01/23 0342 11/02/23 0425  NA 132* 130* 133* 134*  K 3.9 3.8 3.7 4.0  CL 103 101 102 103  CO2 23 23 23 24   GLUCOSE 123* 112* 116* 100*  BUN 21 20 27* 30*   CALCIUM 8.6* 8.5* 8.9 9.1  CREATININE 0.44 0.48 0.56 0.52  GFRNONAA >60 >60 >60 >60    LIVER FUNCTION TESTS: Recent Labs    10/25/23 0350 10/29/23 0327 11/01/23 0342 11/02/23 0425  BILITOT 0.5 0.4 0.6 0.9  AST 47* 36 30 35  ALT 65* 58* 47* 51*  ALKPHOS 180* 152* 128* 124  PROT 7.1 7.3 7.7 7.6  ALBUMIN 2.1* 2.4* 2.4* 2.4*    Assessment and Plan:  82 yo female with hx PE/LLE DVT, PAF, 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 CCS notified. Continue drain. Discontinue drain flushes.    Drain Location: RTG Size: Fr size: 12 Fr Date of placement: 11/1  Currently to: Drain collection device: gravity 24 hour output:  Output by Drain (mL) 10/31/23 0701 - 10/31/23 1900 10/31/23 1901 - 11/01/23 0700 11/01/23 0701 - 11/01/23 1900 11/01/23 1901 - 11/02/23 0700 11/02/23 0701 - 11/02/23 1110  Closed System Drain 1 Right Buttock 0 0 0 0 0    Interval imaging/drain manipulation:  none  Current examination: Not flushing due to fistula  Plan: Do not flush drain. Record output Q shift. Dressing changes QD or PRN if soiled.  Call IR APP or on call IR MD if difficulty sudden change in drain output.  Repeat imaging/possible drain injection in 2 weeks from initial study on 11/11.  Consideration for drain removal if output is < 10 mL/QD (excluding flush material), pending discussion with the providing surgical service.   Discharge planning: Please contact IR APP or on call IR MD prior to patient d/c to ensure appropriate follow up plans are in place. Typically patient will follow up with IR clinic 10-14 days post d/c for repeat imaging/possible drain injection. IR scheduler will contact patient with date/time of appointment. Patient will  possibly need to flush drain QD with 5 cc NS, record output QD, dressing changes every 2-3 days or earlier if soiled.    IR will continue to follow - please call with questions or concerns.    Electronically Signed: Sable Feil, PA-C 11/02/2023, 11:08 AM   I spent a total of 15 Minutes at the the patient's bedside AND on the patient's hospital floor or unit, greater than 50% of which was counseling/coordinating care for pelvic abscess drain.

## 2023-11-02 NOTE — Progress Notes (Signed)
PT Cancellation Note  Patient Details Name: Elizabeth Odom MRN: [id number] DOB: 07-Oct-1941   Cancelled Treatment:    Reason Eval/Treat Not Completed: Other (comment). Pt declines therapy despite encouragement and pain medication; reports "my head hurts", "I don't want to over do it and be miserable", "I can ask them to help me up later". Will continue to follow acutely.    Domenick Bookbinder PT, DPT 11/02/23, 12:55 PM

## 2023-11-03 DIAGNOSIS — R338 Other retention of urine: Secondary | ICD-10-CM | POA: Diagnosis not present

## 2023-11-03 DIAGNOSIS — K56609 Unspecified intestinal obstruction, unspecified as to partial versus complete obstruction: Secondary | ICD-10-CM | POA: Diagnosis not present

## 2023-11-03 DIAGNOSIS — I2699 Other pulmonary embolism without acute cor pulmonale: Secondary | ICD-10-CM | POA: Diagnosis not present

## 2023-11-03 DIAGNOSIS — I824Y2 Acute embolism and thrombosis of unspecified deep veins of left proximal lower extremity: Secondary | ICD-10-CM | POA: Diagnosis not present

## 2023-11-03 LAB — COMPREHENSIVE METABOLIC PANEL
ALT: 59 U/L — ABNORMAL HIGH (ref 0–44)
AST: 38 U/L (ref 15–41)
Albumin: 2.5 g/dL — ABNORMAL LOW (ref 3.5–5.0)
Alkaline Phosphatase: 136 U/L — ABNORMAL HIGH (ref 38–126)
Anion gap: 6 (ref 5–15)
BUN: 29 mg/dL — ABNORMAL HIGH (ref 8–23)
CO2: 25 mmol/L (ref 22–32)
Calcium: 9.1 mg/dL (ref 8.9–10.3)
Chloride: 97 mmol/L — ABNORMAL LOW (ref 98–111)
Creatinine, Ser: 0.7 mg/dL (ref 0.44–1.00)
GFR, Estimated: >60 mL/min (ref >60)
Glucose, Bld: 109 mg/dL — ABNORMAL HIGH (ref 70–99)
Potassium: 3.9 mmol/L (ref 3.5–5.1)
Sodium: 128 mmol/L — ABNORMAL LOW (ref 135–145)
Total Bilirubin: 1.2 mg/dL — ABNORMAL HIGH (ref <1.2)
Total Protein: 7.9 g/dL (ref 6.5–8.1)

## 2023-11-03 LAB — CBC
HCT: 34.5 % — ABNORMAL LOW (ref 36.0–46.0)
Hemoglobin: 11 g/dL — ABNORMAL LOW (ref 12.0–15.0)
MCH: 31 pg (ref 26.0–34.0)
MCHC: 31.9 g/dL (ref 30.0–36.0)
MCV: 97.2 fL (ref 80.0–100.0)
Platelets: 192 10*3/uL (ref 150–400)
RBC: 3.55 MIL/uL — ABNORMAL LOW (ref 3.87–5.11)
RDW: 15.6 % — ABNORMAL HIGH (ref 11.5–15.5)
WBC: 8 10*3/uL (ref 4.0–10.5)
nRBC: 0 % (ref 0.0–0.2)

## 2023-11-03 LAB — MAGNESIUM: Magnesium: 1.9 mg/dL (ref 1.7–2.4)

## 2023-11-03 LAB — PHOSPHORUS: Phosphorus: 4.3 mg/dL (ref 2.5–4.6)

## 2023-11-03 MED ORDER — HYDROCORTISONE 0.5 % EX CREA
TOPICAL_CREAM | Freq: Every day | CUTANEOUS | Status: DC | PRN
  Filled 2023-11-03: qty 28.35

## 2023-11-03 NOTE — Progress Notes (Signed)
Occupational Therapy Treatment Patient Details Name: Elizabeth Odom MRN: [id number] DOB: [birth year] Today's Date: 11/03/2023   History of present illness Patient is a 82 year old female who presented on 10/17 with acute colitis. on10/21, patient underwent ex laparotomy with hartman's resection and colostomy and small bowel resection. PMH: a fib, RLS, syncope, cervical CA, cervical spine fusions, anemia   OT comments  Patient was noted to have increased pain in lower abdomen and bottom on this date impacting participation in session. Patient reporting increased pain in bottom with education on offloading pressure. Patient was +2 to complete transfer to recliner on this date with patient attempting to sit down prior to completion of transfer. Patient will benefit from continued inpatient follow up therapy, <3 hours/day.  Patient's discharge plan remains appropriate at this time. OT will continue to follow acutely.        If plan is discharge home, recommend the following:  A lot of help with bathing/dressing/bathroom;Assistance with cooking/housework;Assist for transportation;A lot of help with walking and/or transfers;Direct supervision/assist for medications management   Equipment Recommendations  None recommended by OT       Precautions / Restrictions Precautions Precautions: Fall Precaution Comments: R post. drain, colostomy, sacral wound and drain, abd  surgical wound Restrictions Weight Bearing Restrictions: No Other Position/Activity Restrictions: abdominal surgery, colostomy       Mobility Bed Mobility Overal bed mobility: Needs Assistance Bed Mobility: Rolling, Sidelying to Sit Rolling: Min assist Sidelying to sit: Mod assist, +2 for physical assistance, +2 for safety/equipment       General bed mobility comments: with increased time to progress trunk and LE into seated position EOB, cued pt to go through log roll due to abdominal surgery           Balance Overall  balance assessment: Needs assistance Sitting-balance support: Feet supported, Bilateral upper extremity supported Sitting balance-Leahy Scale: Poor Sitting balance - Comments: left leaning.   Standing balance support: Bilateral upper extremity supported, Reliant on assistive device for balance, During functional activity Standing balance-Leahy Scale: Poor Standing balance comment: reliant on UE and external assist           ADL either performed or assessed with clinical judgement   ADL Overall ADL's : Needs assistance/impaired           Toilet Transfer: Maximal assistance;+2 for safety/equipment;+2 for physical assistance;Rolling walker (2 wheels);Stand-pivot Statistician Details (indicate cue type and reason): patietn needed TD to complete transfer with patietn attempting to sit prior to reaching recliner. patietn presenting with lean to L side on this date with increased A to maintain upright posture.                  Cognition Arousal: Alert Behavior During Therapy: WFL for tasks assessed/performed Overall Cognitive Status: Within Functional Limits for tasks assessed           General Comments: patient needed increased encouragement to engage in session on this date.                   Pertinent Vitals/ Pain       Pain Assessment Pain Assessment: Faces Faces Pain Scale: Hurts even more Pain Location: buttocks/abdomen Pain Descriptors / Indicators: Constant Pain Intervention(s): Limited activity within patient's tolerance, Monitored during session, Repositioned, Patient requesting pain meds-RN notified, Premedicated before session         Frequency  Min 1X/week        Progress Toward Goals  OT Goals(current goals can  now be found in the care plan section)  Progress towards OT goals: Not progressing toward goals - comment (increased pain today with leaning to L side with sitting and standing.)     Plan      Co-evaluation    PT/OT/SLP  Co-Evaluation/Treatment: Yes Reason for Co-Treatment: To address functional/ADL transfers PT goals addressed during session: Mobility/safety with mobility;Balance;Proper use of DME OT goals addressed during session: ADL's and self-care      AM-PAC OT "6 Clicks" Daily Activity     Outcome Measure   Help from another person eating meals?: A Little Help from another person taking care of personal grooming?: A Little Help from another person toileting, which includes using toliet, bedpan, or urinal?: Total Help from another person bathing (including washing, rinsing, drying)?: A Lot Help from another person to put on and taking off regular upper body clothing?: A Lot Help from another person to put on and taking off regular lower body clothing?: Total 6 Click Score: 12    End of Session Equipment Utilized During Treatment: Gait belt;Rolling walker (2 wheels)  OT Visit Diagnosis: Muscle weakness (generalized) (M62.81);Pain   Activity Tolerance Patient limited by pain   Patient Left with call bell/phone within reach;in chair   Nurse Communication Mobility status;Other (comment) (in room when patient asked for more medications and to put cream on bottom per patient request.)        Time: 8295-6213 OT Time Calculation (min): 25 min  Charges: OT General Charges $OT Visit: 1 Visit OT Treatments $Therapeutic Activity: 8-22 mins  Rosalio Loud, MS Acute Rehabilitation Department Office# [phone]   Selinda Flavin 11/03/2023, 4:51 PM

## 2023-11-03 NOTE — Progress Notes (Signed)
Physical Therapy Treatment Patient Details Name: CORTNE KOWALKE MRN: [id number] DOB: [birth year] Today's Date: 11/03/2023   History of Present Illness Patient is a 82 year old female who presented on 10/17 with acute colitis. on10/21, patient underwent ex laparotomy with hartman's resection and colostomy and small bowel resection. PMH: a fib, RLS, syncope, cervical CA, cervical spine fusions, anemia    PT Comments  Pt requiring encouragement for participation but agreeable to attempt OOB to chair.  Pt continues to require significant assist of 2 for performance of all basic mobility tasks and noted to attempt sitting prior to safe positioning in front of chair and requiring increased assistance to avoid fall and achieve safe transfer - RN in room and aware.    If plan is discharge home, recommend the following: A lot of help with walking and/or transfers;A lot of help with bathing/dressing/bathroom;Assistance with cooking/housework;Help with stairs or ramp for entrance;Two people to help with walking and/or transfers   Can travel by private vehicle     No  Equipment Recommendations  None recommended by PT    Recommendations for Other Services OT consult     Precautions / Restrictions Precautions Precautions: Fall Precaution Comments: R post. drain, colostomy, sacral wound and drain, abd  surgical wound Restrictions Weight Bearing Restrictions: No Other Position/Activity Restrictions: abdominal surgery, colostomy     Mobility  Bed Mobility Overal bed mobility: Needs Assistance Bed Mobility: Rolling, Sidelying to Sit Rolling: Min assist Sidelying to sit: Mod assist, +2 for physical assistance, +2 for safety/equipment       General bed mobility comments: with increased time to progress trunk and LE into seated position EOB, cued pt to go through log roll due to abdominal surgery    Transfers Overall transfer level: Needs assistance Equipment used: Rolling walker (2  wheels) Transfers: Sit to/from Stand, Bed to chair/wheelchair/BSC Sit to Stand: Mod assist, +2 safety/equipment   Step pivot transfers: Max assist, +2 physical assistance, +2 safety/equipment, From elevated surface       General transfer comment: Mod assist x 2 to stand from bedside and correct for L drift in standing.  Step pvt almost completed but pt attempting to sit early and requiring max x 2 to avoid falling and land safely in recliner    Ambulation/Gait               General Gait Details: Step pvt only   Stairs             Wheelchair Mobility     Tilt Bed    Modified Rankin (Stroke Patients Only)       Balance Overall balance assessment: Needs assistance Sitting-balance support: Feet supported, Bilateral upper extremity supported Sitting balance-Leahy Scale: Fair Sitting balance - Comments: left leaning.   Standing balance support: Bilateral upper extremity supported, Reliant on assistive device for balance, During functional activity Standing balance-Leahy Scale: Poor Standing balance comment: reliant on UE and external assist                            Cognition Arousal: Alert Behavior During Therapy: WFL for tasks assessed/performed Overall Cognitive Status: Within Functional Limits for tasks assessed                                          Exercises      General Comments  Pertinent Vitals/Pain Pain Assessment Pain Assessment: Faces Faces Pain Scale: Hurts even more Pain Location: buttocks/abdomen Pain Descriptors / Indicators: Constant Pain Intervention(s): Limited activity within patient's tolerance, Monitored during session, Premedicated before session    Home Living                          Prior Function            PT Goals (current goals can now be found in the care plan section) Acute Rehab PT Goals 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[employer]  Goal Formulation: With patient Time For Goal Achievement: 11/14/23 Potential to Achieve Goals: Good Progress towards PT goals: Progressing toward goals    Frequency    Min 1X/week      PT Plan      Co-evaluation PT/OT/SLP Co-Evaluation/Treatment: Yes Reason for Co-Treatment: To address functional/ADL transfers PT goals addressed during session: Mobility/safety with mobility;Balance;Proper use of DME OT goals addressed during session: ADL's and self-care      AM-PAC PT "6 Clicks" Mobility   Outcome Measure  Help needed turning from your back to your side while in a flat bed without using bedrails?: A Lot Help needed moving from lying on your back to sitting on the side of a flat bed without using bedrails?: A Lot Help needed moving to and from a bed to a chair (including a wheelchair)?: A Lot Help needed standing up from a chair using your arms (e.g., wheelchair or bedside chair)?: A Lot Help needed to walk in hospital room?: Total Help needed climbing 3-5 steps with a railing? : Total 6 Click Score: 10    End of Session Equipment Utilized During Treatment: Gait belt Activity Tolerance: Patient limited by pain;Patient limited by fatigue Patient left: in chair;with call bell/phone within reach Nurse Communication: Mobility status PT Visit Diagnosis: Difficulty in walking, not elsewhere classified (R26.2);Muscle weakness (generalized) (M62.81);Unsteadiness on feet (R26.81);Pain     Time: 1610-9604 PT Time Calculation (min) (ACUTE ONLY): 25 min  Charges:    $Therapeutic Activity: 8-22 mins PT General Charges $$ ACUTE PT VISIT: 1 Visit                     Mauro Kaufmann PT Acute Rehabilitation Services Pager [phone] Office [phone]    [facility] 11/03/2023, 4:39 PM

## 2023-11-03 NOTE — Progress Notes (Signed)
PROGRESS NOTE    Elizabeth Odom  ZOX:[id number] DOB: [birth year] DOA: 09/27/2023 PCP: Elizabeth Pilon, FNP    Chief Complaint  Patient presents with   Abdominal Pain    Brief Narrative:  82 year old with past medical history significant for A-fib, RLS, anemia presented to the ED 10/17 with recurrent abdominal pain, nausea emesis, diarrhea intolerance of oral intake.  She was evaluated in the ED 10/10 for abdominal pain, decreased appetite, fever, diagnosed with possible colitis noted on CT abdomen and pelvis discharged on Augmentin.  She had recurrence of symptoms and presented back to the hospital.  CT initially showed proximal sigmoid colon wall thickening with upstream colonic dilation with air-fluid levels and large amount of stool compatible with obstruction.  9 mm cystic lesion in the tail of the pancreas and subcentimeter lucent lesion in L1 vertebral body.  GI consulted, patient refused colonoscopy but agreed to sigmoidoscopy showing stricture versus mass at the rectosigmoid region.  Scope could not be advanced past this.  Surgery was consulted and she underwent a small bowel resection and Hartman's resection and  colostomy on 10/01/2023.  She continued to have postoperative ileus managed by general surgery.  NG remains in place to suction.   Hospital course also complicated by acute PE and left lower extremity DVT she was started on heparin.  She subsequently developed acute blood loss anemia and subsequent pelvic hematoma near the small bowel anastomosis as well as notable hematuria.  Urology was consulted.  Signed off after hematuria resolution.  Given high risk for bleeding with notable PE DVT evaluated by IR and placed IVC filter 10/05/2023.  Patient required total of 4 units of packed red blood cell.   As her bowel function did not return she underwent a repeat CT abdomen and pelvis on 10/31st showing anastomotic leak into 7 x 6 cm complex fluid collection posteriorly in the pelvis.   IR was consulted and underwent CT-guided percutaneous drain placement into pelvic abscess.  Fluid grew MRSA.  ID consulted and once transitioned to daptomycin and Zosyn.   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  ID recommended no further need of antibiotics.    Needs to be wean off TPN to be able to go to SNF   Assessment & Plan:   Principal Problem:   Large bowel obstruction (HCC) Active Problems:   Acute colitis   Left leg DVT (HCC)   ABLA (acute blood loss anemia)   Palliative care encounter   Counseling and coordination of care   Need for emotional support   Goals of care, counseling/discussion   High risk medication use   Pain   Medication management   Pressure injury of skin   Malnutrition of moderate degree   Iron deficiency anemia due to chronic blood loss   Acute pulmonary embolism (HCC)   Hyponatremia   Acute urinary retention   #1 large bowel obstruction status post Hartman's resection, colostomy complicated by anastomosis leak of pelvic abscess requiring drain placement/fistula connection with colon staple line anastomosis/postop ileus versus partial small bowel obstruction -Patient noted to have presented abdominal pain with CT abdomen and pelvis done 09/27/2023 with proximal sigmoid colon thickening. -Patient seen in consultation by GI underwent sigmoidoscopy 09/29/2023 which showed a tight stricture found to have a mass, biopsy was negative for malignancy suggesting inflammation. -General Surgery consulted status post expiratory laparotomy Hartman's resection 10/01/2023 with small bowel resection and ostomy per Dr. [name]. -Repeat  CT abdomen and pelvis 10/11/2023 with small leak at the anastomosis going into 6.7 cm fluid collection. -Patient seen in consultation by IR underwent drain catheter placement of pelvic abscess on 10/12/2023 with cultures growing MRSA. -Patient seen in consultation  by ID and treated with daptomycin and Zosyn. -Repeat CT abdomen and pelvis 10/18/2023 with percutaneous peak tail drain catheter with no persistent anastomotic leak or fistula. -10/22/2023 pelvic drain injection study showed fistulous communication with colon/staple line anastomosis with drain to remain in place. -Per ID no further indication for antibiotics and antibiotics subsequently discontinued. -Was on TPN which has been weaned off per general surgery recommendations. -Diet advanced to regular diet which patient seems to be tolerating. -Continue daily dressings. -Mobilization. -Patient assessed by IR who are planning on pelvic drain injection study to be done on 11/05/2023. -Per general surgery.  2.  Hyponatremia -Was on D10 which was subsequently discontinued with improvement with hyponatremia. -Sodium fluctuating with sodium at 128 this morning. -Follow.  3.  Acute blood loss anemia status post perianastomotic hematoma with hematuria -Status post transfusion 4 units PRBCs during the hospitalization. -Status post IVC filter. -Seen in consultation by urology no indication for cystoscopy or intervention at this time. -Hemoglobin currently stable at 11.0.  -Follow H&H.  4.  Acute PE (small to moderate clot burden) without right heart strain/acute left lower extremity DVT -Patient noted unable to tolerate anticoagulation status post IVC filter placement 10/05/2023. -2D echo with no evidence of right heart strain. -CT scan with no evidence of right heart strain. -Outpatient follow-up with PCP.  5.  Thyroid nodule in the inferior aspect of the isthmus -Outpatient follow-up.  6.  Paroxysmal atrial fibrillation -Rate controlled.   -Not on anticoagulation secondary to problem #3.  7.  Hypokalemia/hypophosphatemia/hypomagnesemia -Patient was on TPN which has been weaned off. -Potassium currently at 3.9, phosphorus at 4.3, magnesium at 1.9.  -Repeat labs in AM.   8.  Urinary  retention -Urine output of 1.450 L over the past 24 hours. -Continue Foley catheter.  9.  Hypotension -Resolved.  10.  Leukocytosis -Resolved.  11.  Moderate protein calorie malnutrition -TPN weaned off.  Diet advanced.   12.  Pressure injury right Botox stage II/MASD Pressure Injury 10/08/23 Buttocks Right;Mid Stage 2 -  Partial thickness loss of dermis presenting as a shallow open injury with a red, pink wound bed without slough. (Active)  10/08/23 0800  Location: Buttocks  Location Orientation: Right;Mid  Staging: Stage 2 -  Partial thickness loss of dermis presenting as a shallow open injury with a red, pink wound bed without slough.  Wound Description (Comments):   Present on Admission:         DVT prophylaxis: SCDs Code Status: DNR Family Communication: Updated patient.  Updated daughter at bedside.  Disposition: SNF once weaned off TPN and completion of drain injection study on 11/05/2023 and further recommendations made per IR.  Cleared by general surgery.  Status is: Inpatient Remains inpatient appropriate because: Severity of illness   Consultants:  Gastroenterology: Dr. Lorenso Quarry 09/28/2023 General Surgery: Dr. [name] 09/29/2023 Wound care consult PCCM: Dr. Jamison Neighbor 10/04/2023 Urology: Dr. Cardell Peach 10/05/2023 Interventional radiology: Dr. Deanne Coffer 10/05/2023 Palliative care: Dr. Patterson Hammersmith 10/05/2023 Interventional radiology: Dr.Mugweru 10/12/2023 ID: Dr. Thedore Mins 10/13/2023   Procedures:  Flexible sigmoidoscopy per Dr. Marca Ancona GI 09/29/2023 IVC gram and IV filter placement per IR: Dr. Deanne Coffer 10/05/2023 Drainage catheter placement into pelvic abscess per IR: Dr.Mugweru 10/12/2023 CT abdomen and pelvis 09/27/2023, 10/11/2023, 10/19/2023 CT chest 09/30/2023 MRI abdomen 09/28/2023  Drain injection/fistulogram/abscessogram per IR 10/22/2023 2D echo 10/01/2023 Lower extremity Dopplers 10/04/2023 CT abdomen and pelvis without contrast 10/04/2023 Sigmoid colectomy with colostomy  creation/Hartman's procedure/small bowel resection with anastomosis: Per general surgery: Dr. [name] 10/01/2023 Transfusion 4 units PRBCs  Antimicrobials:  Anti-infectives (From admission, onward)    Start     Dose/Rate Route Frequency Ordered Stop   10/15/23 1800  vancomycin (VANCOCIN) IVPB 1000 mg/200 mL premix  Status:  Discontinued        1,000 mg 200 mL/hr over 60 Minutes Intravenous Every 24 hours 10/14/23 1740 10/15/23 1045   10/15/23 1430  piperacillin-tazobactam (ZOSYN) IVPB 3.375 g        3.375 g 100 mL/hr over 30 Minutes Intravenous  Once 10/15/23 1343 10/15/23 1503   10/15/23 1400  DAPTOmycin (CUBICIN) IVPB 500 mg/48mL premix  Status:  Discontinued        8 mg/kg  64.1 kg 100 mL/hr over 30 Minutes Intravenous Daily 10/15/23 1045 10/22/23 1321   10/14/23 1800  vancomycin (VANCOREADY) IVPB 1250 mg/250 mL        1,250 mg 166.7 mL/hr over 90 Minutes Intravenous  Once 10/14/23 1740 10/14/23 2255   10/14/23 1400  piperacillin-tazobactam (ZOSYN) IVPB 3.375 g  Status:  Discontinued        3.375 g 12.5 mL/hr over 240 Minutes Intravenous Every 8 hours 10/14/23 0845 10/22/23 1321   10/14/23 0900  piperacillin-tazobactam (ZOSYN) IVPB 3.375 g        3.375 g 100 mL/hr over 30 Minutes Intravenous NOW 10/14/23 0844 10/14/23 1002   10/05/23 1200  piperacillin-tazobactam (ZOSYN) IVPB 3.375 g        3.375 g 12.5 mL/hr over 240 Minutes Intravenous Every 8 hours 10/05/23 0721 10/09/23 1716   10/04/23 2000  piperacillin-tazobactam (ZOSYN) IVPB 3.375 g  Status:  Discontinued        3.375 g 12.5 mL/hr over 240 Minutes Intravenous Every 8 hours 10/04/23 1827 10/05/23 0721   09/27/23 2315  piperacillin-tazobactam (ZOSYN) IVPB 3.375 g  Status:  Discontinued        3.375 g 12.5 mL/hr over 240 Minutes Intravenous Every 8 hours 09/27/23 2305 10/02/23 1106         Subjective: Patient sitting up in bed.  Still with complaints of pain in her back/buttocks region and some diffuse abdominal pain.   Tolerating oral intake.  Patient states she is not going to SNF until she speaks with her family and was told she needed a pelvic drain injection study early next week.    Objective: Vitals:   11/02/23 0531 11/02/23 1237 11/02/23 2218 11/03/23 0625  BP: 117/61 118/68 (!) 116/56 118/69  Pulse: 66 88 77 70  Resp: 18 17 18 18   Temp: 97.9 F (36.6 C) 98 F (36.7 C) 98.7 F (37.1 C) 97.7 F (36.5 C)  TempSrc: Oral Oral Oral Oral  SpO2: 96% 97% 97% 97%  Weight:      Height:        Intake/Output Summary (Last 24 hours) at 11/03/2023 1211 Last data filed at 11/03/2023 1033 Gross per 24 hour  Intake 840 ml  Output 1450 ml  Net -610 ml   Filed Weights   10/25/23 0713 10/28/23 0753 10/31/23 0500  Weight: 62.2 kg 58.2 kg 59.9 kg    Examination:  General exam: NAD Respiratory system: CTAB anterior lung fields.  No wheezes, no crackles, no rhonchi.  Fair air movement.  Speaking in full sentences.  Cardiovascular system: Regular rate rhythm no murmurs rubs or  gallops.  No JVD.  No lower pitting lower extremity edema. Gastrointestinal system: Abdomen is soft, nondistended, diffuse tenderness to palpation.  Bandage over mid abdominal wound.  Ostomy with brown stool.  Positive bowel sounds.   Central nervous system: Alert and oriented. No focal neurological deficits. Extremities: Symmetric 5 x 5 power. Skin: No rashes, lesions or ulcers Psychiatry: Judgement and insight appear normal. Mood & affect appropriate.     Data Reviewed: I have personally reviewed following labs and imaging studies  CBC: Recent Labs  Lab 11/01/23 0342 11/02/23 0425 11/03/23 0306  WBC 6.5 6.9 8.0  NEUTROABS 3.6  --   --   HGB 10.5* 11.0* 11.0*  HCT 32.3* 34.5* 34.5*  MCV 96.4 97.7 97.2  PLT 170 173 192    Basic Metabolic Panel: Recent Labs  Lab 10/28/23 1120 10/29/23 0327 11/01/23 0342 11/02/23 0425 11/03/23 0306  NA 132* 130* 133* 134* 128*  K 3.9 3.8 3.7 4.0 3.9  CL 103 101 102 103 97*   CO2 23 23 23 24 25   GLUCOSE 123* 112* 116* 100* 109*  BUN 21 20 27* 30* 29*  CREATININE 0.44 0.48 0.56 0.52 0.70  CALCIUM 8.6* 8.5* 8.9 9.1 9.1  MG  --  1.9 1.8 1.8 1.9  PHOS  --  3.0 3.7 4.0 4.3    GFR: Estimated Creatinine Clearance: 47.9 mL/min (by C-G formula based on SCr of 0.7 mg/dL).  Liver Function Tests: Recent Labs  Lab 10/29/23 0327 11/01/23 0342 11/02/23 0425 11/03/23 0306  AST 36 30 35 38  ALT 58* 47* 51* 59*  ALKPHOS 152* 128* 124 136*  BILITOT 0.4 0.6 0.9 1.2*  PROT 7.3 7.7 7.6 7.9  ALBUMIN 2.4* 2.4* 2.4* 2.5*    CBG: No results for input(s): "GLUCAP" in the last 168 hours.   No results found for this or any previous visit (from the past 240 hour(s)).       Radiology Studies: No results found.      Scheduled Meds:  Chlorhexidine Gluconate Cloth  6 each Topical Daily   docusate sodium  100 mg Oral BID   feeding supplement  1 Container Oral BID BM   Gerhardt's butt cream   Topical BID   ketotifen  2 drop Both Eyes BID   multivitamin with minerals  1 tablet Oral Daily   polyethylene glycol  17 g Oral Daily   sodium chloride flush  10-40 mL Intracatheter Q12H   Continuous Infusions:     LOS: 37 days    Time spent: 35 minutes    Ramiro Harvest, MD [facility]   To contact the attending provider between 7A-7P or the covering provider during after hours 7P-7A, please log into the web site [email or web address] and access using universal [place] password for that web site. If you do not have the password, please call the hospital operator.  11/03/2023, 12:11 PM

## 2023-11-03 NOTE — Progress Notes (Signed)
33 Days Post-Op   Subjective/Chief Complaint: Ongoing complaints about pain, mostly in her extremities and back.   Objective: Vital signs in last 24 hours: Temp:  [97.7 F (36.5 C)-98.7 F (37.1 C)] 97.7 F (36.5 C) (11/23 0625) Pulse Rate:  [70-88] 70 (11/23 0625) Resp:  [17-18] 18 (11/23 0625) BP: (116-118)/(56-69) 118/69 (11/23 0625) SpO2:  [97 %] 97 % (11/23 0625) Last BM Date : 11/02/23  Intake/Output from previous day: 11/22 0701 - 11/23 0700 In: 760 [P.O.:760] Out: 1550 [Urine:1450; Stool:100] Intake/Output this shift: Total I/O In: -  Out: 400 [Urine:200; Stool:200]  Alert, calm Unlabored respirations Abdomen is soft, benign.  Midline wound is granulating and shallow without any unusual drainage.  Ostomy with dark liquid output.  Drain output with thin brown output; output recorded as 0/ 24 hours  Lab Results:  Recent Labs    11/02/23 0425 11/03/23 0306  WBC 6.9 8.0  HGB 11.0* 11.0*  HCT 34.5* 34.5*  PLT 173 192    BMET Recent Labs    11/02/23 0425 11/03/23 0306  NA 134* 128*  K 4.0 3.9  CL 103 97*  CO2 24 25  GLUCOSE 100* 109*  BUN 30* 29*  CREATININE 0.52 0.70  CALCIUM 9.1 9.1   PT/INR No results for input(s): "LABPROT", "INR" in the last 72 hours. ABG No results for input(s): "PHART", "HCO3" in the last 72 hours.  Invalid input(s): "PCO2", "PO2"  Studies/Results: No results found.  Anti-infectives: Anti-infectives (From admission, onward)    Start     Dose/Rate Route Frequency Ordered Stop   10/15/23 1800  vancomycin (VANCOCIN) IVPB 1000 mg/200 mL premix  Status:  Discontinued        1,000 mg 200 mL/hr over 60 Minutes Intravenous Every 24 hours 10/14/23 1740 10/15/23 1045   10/15/23 1430  piperacillin-tazobactam (ZOSYN) IVPB 3.375 g        3.375 g 100 mL/hr over 30 Minutes Intravenous  Once 10/15/23 1343 10/15/23 1503   10/15/23 1400  DAPTOmycin (CUBICIN) IVPB 500 mg/56mL premix  Status:  Discontinued        8 mg/kg  64.1  kg 100 mL/hr over 30 Minutes Intravenous Daily 10/15/23 1045 10/22/23 1321   10/14/23 1800  vancomycin (VANCOREADY) IVPB 1250 mg/250 mL        1,250 mg 166.7 mL/hr over 90 Minutes Intravenous  Once 10/14/23 1740 10/14/23 2255   10/14/23 1400  piperacillin-tazobactam (ZOSYN) IVPB 3.375 g  Status:  Discontinued        3.375 g 12.5 mL/hr over 240 Minutes Intravenous Every 8 hours 10/14/23 0845 10/22/23 1321   10/14/23 0900  piperacillin-tazobactam (ZOSYN) IVPB 3.375 g        3.375 g 100 mL/hr over 30 Minutes Intravenous NOW 10/14/23 0844 10/14/23 1002   10/05/23 1200  piperacillin-tazobactam (ZOSYN) IVPB 3.375 g        3.375 g 12.5 mL/hr over 240 Minutes Intravenous Every 8 hours 10/05/23 0721 10/09/23 1716   10/04/23 2000  piperacillin-tazobactam (ZOSYN) IVPB 3.375 g  Status:  Discontinued        3.375 g 12.5 mL/hr over 240 Minutes Intravenous Every 8 hours 10/04/23 1827 10/05/23 0721   09/27/23 2315  piperacillin-tazobactam (ZOSYN) IVPB 3.375 g  Status:  Discontinued        3.375 g 12.5 mL/hr over 240 Minutes Intravenous Every 8 hours 09/27/23 2305 10/02/23 1106       Assessment/Plan: POD 32 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 [name] -  CT 10/24 with hematoma in the pelvis adjacent to small bowel anastomosis and ileus vs obstruction  - IR 11/1: drain placement for small bowel anastomotic leak/ abscess drainage - CT 11/8 with resolution in previous right hemipelvic collection and no leakage of enteric contrast - drain injection study 11/11 confirms fistulous connection - drain left in place. Stop flushing drain and placed to gravity.  Anticipate drain study next week - reg diet, supplements; appreciate ongoing care from dietitian.  P.o. intake has been okay the last 3 days.  TPN holiday. - getting in enough nutritionally will be key to trying to heal fistula which fortunately appears to be low output at this point - continue daily dressing  changes  - Needs mobilization and physical conditioning -Wean off IV medication/narcotics.    FEN: reg diet -appreciate ongoing care from dietitian, we will see how her appetite is off the TPN Void trial this week? Anticipate drain study this coming week Okay for transition to SNF when bed available VTE: IVC Filter  ID: no current abx     ABL anemia secondary to post-op hematomas and hematuria  Acute PE and LLE DVT Pancreatic tail cyst Thyroid nodule  Chronic HFpEF Paroxysmal A. Fib Pre-diabetes RLS    Elizabeth Odom 11/03/2023

## 2023-11-03 NOTE — Progress Notes (Signed)
    Referring Physician(s): Tsuei,M  Supervising Physician: Marliss Coots  Patient Status:  [facility] - In-pt  Chief Complaint: Pelvic pain/abscess   Subjective: Pt resting quietly in bed, eating cheese sandwich; states I still "hurt all over"   Allergies: Elemental sulfur  Medications: Prior to Admission medications   Medication Sig Start Date End Date Taking? Authorizing Provider  amoxicillin-clavulanate (AUGMENTIN) 875-125 MG tablet Take 1 tablet by mouth every 12 (twelve) hours. 09/21/23  Yes Horton, Kristie M, DO  ondansetron (ZOFRAN-ODT) 4 MG disintegrating tablet Take 4 mg by mouth every 8 (eight) hours as needed for vomiting or nausea. 09/16/23  Yes [provider]  meclizine (ANTIVERT) 25 MG tablet Take 25 mg by mouth 3 (three) times daily. Patient not taking: Reported on 11/23/2020    [provider]  Omega-3 Fatty Acids (FISH OIL) 500 MG CAPS Take 1 capsule by mouth daily. Patient not taking: Reported on 09/27/2023    [provider]     Vital Signs: BP 118/69 (BP Location: Right Arm)   Pulse 70   Temp 97.7 F (36.5 C) (Oral)   Resp 18   Ht 5\' 5"  (1.651 m)   Wt 132 lb (59.9 kg)   SpO2 97%   BMI 21.97 kg/m   Physical Exam awake, answering questions ok, rt TG/pelvic drain intact, small amount light brown fluid in bag, minimal output has been recorded  Imaging: No results found.  Labs:  CBC: Recent Labs    10/27/23 0312 11/01/23 0342 11/02/23 0425 11/03/23 0306  WBC 7.0 6.5 6.9 8.0  HGB 10.0* 10.5* 11.0* 11.0*  HCT 30.9* 32.3* 34.5* 34.5*  PLT 219 170 173 192    COAGS: Recent Labs    09/20/23 1902 10/05/23 0247 10/11/23 1255 10/12/23 0655  INR 1.1 1.1 1.1 1.1  APTT  --  30  --   --     BMP: Recent Labs    10/29/23 0327 11/01/23 0342 11/02/23 0425 11/03/23 0306  NA 130* 133* 134* 128*  K 3.8 3.7 4.0 3.9  CL 101 102 103 97*  CO2 23 23 24 25   GLUCOSE 112* 116* 100* 109*  BUN 20 27* 30* 29*  CALCIUM 8.5*  8.9 9.1 9.1  CREATININE 0.48 0.56 0.52 0.70  GFRNONAA >60 >60 >60 >60    LIVER FUNCTION TESTS: Recent Labs    10/29/23 0327 11/01/23 0342 11/02/23 0425 11/03/23 0306  BILITOT 0.4 0.6 0.9 1.2*  AST 36 30 35 38  ALT 58* 47* 51* 59*  ALKPHOS 152* 128* 124 136*  PROT 7.3 7.7 7.6 7.9  ALBUMIN 2.4* 2.4* 2.4* 2.5*    Assessment and Plan: 82 yo female with hx PE/LLE DVT, PAF, s/p Hartman's resection and colostomy with SBR for diverticular stricture 10/01/23; now with post op pelvic fluid collection, s/p drain placement 11/1; afebrile, drain fl cx- MRSA; pelvic drain injection study 11/11 revealed likely fistulous communication with colon/staple line(anastomosis); WBC nl, hgb stable, creat nl; will plan f/u pelvic drain injection study on 11/25   Electronically Signed: D. Jeananne Rama, PA-C 11/03/2023, 10:29 AM   I spent a total of 15 Minutes at the the patient's bedside AND on the patient's hospital floor or unit, greater than 50% of which was counseling/coordinating care for pelvic abscess drain    Patient ID: Elizabeth Odom, female   DOB: 04-01-1941, 82 y.o.   MRN: [id number]

## 2023-11-03 NOTE — Plan of Care (Signed)
Problem: Education: Goal: Ability to describe self-care measures that may prevent or decrease complications (Diabetes Survival Skills Education) will improve Outcome: Not Progressing Goal: Individualized Educational Video(s) Outcome: Not Progressing   Problem: Coping: Goal: Ability to adjust to condition or change in health will improve Outcome: Not Progressing   Problem: Fluid Volume: Goal: Ability to maintain a balanced intake and output will improve Outcome: Not Progressing   Problem: Health Behavior/Discharge Planning: Goal: Ability to identify and utilize available resources and services will improve Outcome: Not Progressing Goal: Ability to manage health-related needs will improve Outcome: Not Progressing   Problem: Metabolic: Goal: Ability to maintain appropriate glucose levels will improve Outcome: Not Progressing   Problem: Nutritional: Goal: Maintenance of adequate nutrition will improve Outcome: Not Progressing Goal: Progress toward achieving an optimal weight will improve Outcome: Not Progressing

## 2023-11-03 NOTE — Plan of Care (Signed)
  Problem: Fluid Volume: Goal: Ability to maintain a balanced intake and output will improve Outcome: Progressing   Problem: Health Behavior/Discharge Planning: Goal: Ability to manage health-related needs will improve Outcome: Progressing   Problem: Nutritional: Goal: Maintenance of adequate nutrition will improve Outcome: Progressing Goal: Progress toward achieving an optimal weight will improve Outcome: Progressing   Problem: Tissue Perfusion: Goal: Adequacy of tissue perfusion will improve Outcome: Progressing   Problem: Health Behavior/Discharge Planning: Goal: Ability to manage health-related needs will improve Outcome: Progressing   Problem: Clinical Measurements: Goal: Diagnostic test results will improve Outcome: Progressing

## 2023-11-03 NOTE — TOC Progression Note (Addendum)
Transition of Care [facility]) - Progression Note    Patient Details  Name: Elizabeth Odom MRN: [id number] Date of Birth: 18-Mar-1941  Transition of Care [facility]) CM/SW Contact  Elizabeth Prows, RN Phone Number: 11/03/2023, 9:50 AM  Clinical Narrative:    Notified by Elizabeth Odom, Rep from [employer] auth for SNF for 7 days received # [id number], and Elizabeth Odom # [phone]: spoke w/ Elizabeth Odom at [facility]; she says pt can admit today; she requests extra ostomy pouch be sent w. pt; Elizabeth Odom gave call report # [phone];  RM # [id number]; Elizabeth Odom notified; awaiting d/c summary; attempted to notify pt's grand dtr Elizabeth Odom; LVM for her at [phone]; awaiting return call.  -1034- return call from  pt's grand dtr; she says pt was supposed to d/c to [facility]; explained that facility did not offer bed; also explained Elizabeth Odom is only other bed offer; Elizabeth Odom says she will come to hospital to speak w/ pt.  -1213- notified by Elizabeth Odom Will keep patient over the weekend. Per IR needs a drain study injection to be done here on Monday; Elizabeth Odom at [place] updated on d/c plan  -1313- called by pt's gdt Elizabeth Odom; Elizabeth Odom says she is the pt's POA and she will get input for pt's d/c plan w/ pt's sister, and brother; she was given update that pt will not d/c this weekend.  Expected Discharge Plan: Skilled Nursing Facility Barriers to Discharge: Continued Medical Work up, English as a second language teacher, SNF Pending bed offer  Expected Discharge Plan and Services In-house Referral: NA Discharge Planning Services: CM Consult Post Acute Care Choice: Home Health Living arrangements for the past 2 months: Apartment                 DME Arranged: N/A DME Agency: NA       HH Arranged: PT, RN HH Agency: [facility] Date HH Agency Contacted: 10/02/23 Time HH Agency Contacted: 1550 Representative spoke with at [facility] Agency: Morrie Sheldon   Social Determinants of Health (SDOH) Interventions SDOH  Screenings   Food Insecurity: No Food Insecurity (09/28/2023)  Housing: Low Risk  (09/28/2023)  Transportation Needs: No Transportation Needs (09/28/2023)  Utilities: Not At Risk (09/28/2023)  Tobacco Use: Low Risk  (10/04/2023)    Readmission Risk Interventions    10/19/2023    6:13 PM  Readmission Risk Prevention Plan  Transportation Screening Complete  PCP or Specialist Appt within 5-7 Days Complete  Home Care Screening Complete  Medication Review (RN CM) Complete

## 2023-11-04 DIAGNOSIS — I2699 Other pulmonary embolism without acute cor pulmonale: Secondary | ICD-10-CM | POA: Diagnosis not present

## 2023-11-04 DIAGNOSIS — K56609 Unspecified intestinal obstruction, unspecified as to partial versus complete obstruction: Secondary | ICD-10-CM | POA: Diagnosis not present

## 2023-11-04 DIAGNOSIS — I824Y2 Acute embolism and thrombosis of unspecified deep veins of left proximal lower extremity: Secondary | ICD-10-CM | POA: Diagnosis not present

## 2023-11-04 DIAGNOSIS — R338 Other retention of urine: Secondary | ICD-10-CM | POA: Diagnosis not present

## 2023-11-04 LAB — CBC
HCT: 33.3 % — ABNORMAL LOW (ref 36.0–46.0)
Hemoglobin: 10.7 g/dL — ABNORMAL LOW (ref 12.0–15.0)
MCH: 30.8 pg (ref 26.0–34.0)
MCHC: 32.1 g/dL (ref 30.0–36.0)
MCV: 96 fL (ref 80.0–100.0)
Platelets: 199 10*3/uL (ref 150–400)
RBC: 3.47 MIL/uL — ABNORMAL LOW (ref 3.87–5.11)
RDW: 15.3 % (ref 11.5–15.5)
WBC: 8.3 10*3/uL (ref 4.0–10.5)
nRBC: 0 % (ref 0.0–0.2)

## 2023-11-04 LAB — BASIC METABOLIC PANEL
Anion gap: 6 (ref 5–15)
BUN: 23 mg/dL (ref 8–23)
CO2: 25 mmol/L (ref 22–32)
Calcium: 8.9 mg/dL (ref 8.9–10.3)
Chloride: 97 mmol/L — ABNORMAL LOW (ref 98–111)
Creatinine, Ser: 0.62 mg/dL (ref 0.44–1.00)
GFR, Estimated: >60 mL/min (ref >60)
Glucose, Bld: 109 mg/dL — ABNORMAL HIGH (ref 70–99)
Potassium: 3.5 mmol/L (ref 3.5–5.1)
Sodium: 128 mmol/L — ABNORMAL LOW (ref 135–145)

## 2023-11-04 LAB — MAGNESIUM: Magnesium: 1.7 mg/dL (ref 1.7–2.4)

## 2023-11-04 MED ORDER — GERHARDT'S BUTT CREAM
TOPICAL_CREAM | Freq: Three times a day (TID) | CUTANEOUS | Status: DC
  Filled 2023-11-04 (×2): qty 1

## 2023-11-04 MED ORDER — MAGNESIUM SULFATE 2 GM/50ML IV SOLN
2.0000 g | Freq: Once | INTRAVENOUS | Status: AC
  Administered 2023-11-04: 2 g via INTRAVENOUS
  Filled 2023-11-04: qty 50

## 2023-11-04 MED ORDER — HYDROMORPHONE HCL 1 MG/ML IJ SOLN: 0.5000 mg | Freq: Three times a day (TID) | INTRAMUSCULAR | Status: DC | PRN

## 2023-11-04 NOTE — Progress Notes (Signed)
PROGRESS NOTE    Elizabeth Odom  VZD:[id number] DOB: [birth year] DOA: 09/27/2023 PCP: [name], FNP    Chief Complaint  Patient presents with   Abdominal Pain    Brief Narrative:  82 year old with past medical history significant for A-fib, RLS, anemia presented to the ED 10/17 with recurrent abdominal pain, nausea emesis, diarrhea intolerance of oral intake.  She was evaluated in the ED 10/10 for abdominal pain, decreased appetite, fever, diagnosed with possible colitis noted on CT abdomen and pelvis discharged on Augmentin.  She had recurrence of symptoms and presented back to the hospital.  CT initially showed proximal sigmoid colon wall thickening with upstream colonic dilation with air-fluid levels and large amount of stool compatible with obstruction.  9 mm cystic lesion in the tail of the pancreas and subcentimeter lucent lesion in L1 vertebral body.  GI consulted, patient refused colonoscopy but agreed to sigmoidoscopy showing stricture versus mass at the rectosigmoid region.  Scope could not be advanced past this.  Surgery was consulted and she underwent a small bowel resection and Hartman's resection and  colostomy on 10/01/2023.  She continued to have postoperative ileus managed by general surgery.  NG remains in place to suction.   Hospital course also complicated by acute PE and left lower extremity DVT she was started on heparin.  She subsequently developed acute blood loss anemia and subsequent pelvic hematoma near the small bowel anastomosis as well as notable hematuria.  Urology was consulted.  Signed off after hematuria resolution.  Given high risk for bleeding with notable PE DVT evaluated by IR and placed IVC filter 10/05/2023.  Patient required total of 4 units of packed red blood cell.   As her bowel function did not return she underwent a repeat CT abdomen and pelvis on 10/31st showing anastomotic leak into 7 x 6 cm complex fluid collection posteriorly in the pelvis.   IR was consulted and underwent CT-guided percutaneous drain placement into pelvic abscess.  Fluid grew MRSA.  ID consulted and once transitioned to daptomycin and Zosyn.   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  ID recommended no further need of antibiotics.    Needs to be wean off TPN to be able to go to SNF   Assessment & Plan:   Principal Problem:   Large bowel obstruction (HCC) Active Problems:   Acute colitis   Left leg DVT (HCC)   ABLA (acute blood loss anemia)   Palliative care encounter   Counseling and coordination of care   Need for emotional support   Goals of care, counseling/discussion   High risk medication use   Pain   Medication management   Pressure injury of skin   Malnutrition of moderate degree   Iron deficiency anemia due to chronic blood loss   Acute pulmonary embolism (HCC)   Hyponatremia   Acute urinary retention   #1 large bowel obstruction status post Hartman's resection, colostomy complicated by anastomosis leak of pelvic abscess requiring drain placement/fistula connection with colon staple line anastomosis/postop ileus versus partial small bowel obstruction -Patient noted to have presented abdominal pain with CT abdomen and pelvis done 09/27/2023 with proximal sigmoid colon thickening. -Patient seen in consultation by GI underwent sigmoidoscopy 09/29/2023 which showed a tight stricture found to have a mass, biopsy was negative for malignancy suggesting inflammation. -General Surgery consulted status post expiratory laparotomy Hartman's resection 10/01/2023 with small bowel resection and ostomy per Dr. [name]. -Repeat  CT abdomen and pelvis 10/11/2023 with small leak at the anastomosis going into 6.7 cm fluid collection. -Patient seen in consultation by IR underwent drain catheter placement of pelvic abscess on 10/12/2023 with cultures growing MRSA. -Patient seen in consultation  by ID and treated with daptomycin and Zosyn. -Repeat CT abdomen and pelvis 10/18/2023 with percutaneous peak tail drain catheter with no persistent anastomotic leak or fistula. -10/22/2023 pelvic drain injection study showed fistulous communication with colon/staple line anastomosis with drain to remain in place. -Per ID no further indication for antibiotics and antibiotics subsequently discontinued. -Was on TPN which has been weaned off per general surgery recommendations. -Diet advanced to regular diet which patient seems to be tolerating. -Continue daily dressings. -Mobilization. -Patient assessed by IR who are planning on pelvic drain injection study to be done on 11/05/2023. -Per general surgery.  2.  Hyponatremia -Was on D10 which was subsequently discontinued with improvement with hyponatremia. -Sodium fluctuating with sodium at 128 this morning. -Follow.  3.  Acute blood loss anemia status post perianastomotic hematoma with hematuria -Status post transfusion 4 units PRBCs during the hospitalization. -Status post IVC filter. -Seen in consultation by urology no indication for cystoscopy or intervention at this time. -Hemoglobin currently stable at 10.7. -Follow H&H.  4.  Acute PE (small to moderate clot burden) without right heart strain/acute left lower extremity DVT -Patient noted unable to tolerate anticoagulation status post IVC filter placement 10/05/2023. -2D echo with no evidence of right heart strain. -CT scan with no evidence of right heart strain. -Outpatient follow-up with PCP.  5.  Thyroid nodule in the inferior aspect of the isthmus -Outpatient follow-up.  6.  Paroxysmal atrial fibrillation -Rate controlled.   -Not on anticoagulation secondary to problem #3.  7.  Hypokalemia/hypophosphatemia/hypomagnesemia -Patient was on TPN which has been weaned off. -Potassium currently at 3.5, phosphorus at 4.3, magnesium at 1.7.  -Magnesium 2 g IV x 1. -Repeat labs in  AM.   8.  Urinary retention -Urine output of 600 cc over the past 24 hours. -Per general surgery patient for voiding trial today.  9.  Hypotension -Resolved. -Follow.  10.  Leukocytosis -Resolved.  11.  Moderate protein calorie malnutrition -TPN weaned off.  Diet advanced.   12.  Pressure injury right Botox stage II/MASD Pressure Injury 10/08/23 Buttocks Right;Mid Stage 2 -  Partial thickness loss of dermis presenting as a shallow open injury with a red, pink wound bed without slough. (Active)  10/08/23 0800  Location: Buttocks  Location Orientation: Right;Mid  Staging: Stage 2 -  Partial thickness loss of dermis presenting as a shallow open injury with a red, pink wound bed without slough.  Wound Description (Comments):   Present on Admission:         DVT prophylaxis: SCDs Code Status: DNR Family Communication: Updated patient.  Updated daughter at bedside.  Disposition: SNF once weaned off TPN and completion of drain injection study on 11/05/2023 and further recommendations made per IR.  Cleared by general surgery.  Status is: Inpatient Remains inpatient appropriate because: Severity of illness   Consultants:  Gastroenterology: Dr. Lorenso Quarry 09/28/2023 General Surgery: Dr. [name] 09/29/2023 Wound care consult PCCM: Dr. Jamison Neighbor 10/04/2023 Urology: Dr. Cardell Peach 10/05/2023 Interventional radiology: Dr. Deanne Coffer 10/05/2023 Palliative care: Dr. Patterson Hammersmith 10/05/2023 Interventional radiology: Dr.Mugweru 10/12/2023 ID: Dr. Thedore Mins 10/13/2023   Procedures:  Flexible sigmoidoscopy per Dr. Marca Ancona GI 09/29/2023 IVC gram and IV filter placement per IR: Dr. Deanne Coffer 10/05/2023 Drainage catheter placement into pelvic abscess per IR: Dr.Mugweru 10/12/2023 CT abdomen  and pelvis 09/27/2023, 10/11/2023, 10/19/2023 CT chest 09/30/2023 MRI abdomen 09/28/2023 Drain injection/fistulogram/abscessogram per IR 10/22/2023 2D echo 10/01/2023 Lower extremity Dopplers 10/04/2023 CT abdomen and pelvis  without contrast 10/04/2023 Sigmoid colectomy with colostomy creation/Hartman's procedure/small bowel resection with anastomosis: Per general surgery: Dr. [name] 10/01/2023 Transfusion 4 units PRBCs  Antimicrobials:  Anti-infectives (From admission, onward)    Start     Dose/Rate Route Frequency Ordered Stop   10/15/23 1800  vancomycin (VANCOCIN) IVPB 1000 mg/200 mL premix  Status:  Discontinued        1,000 mg 200 mL/hr over 60 Minutes Intravenous Every 24 hours 10/14/23 1740 10/15/23 1045   10/15/23 1430  piperacillin-tazobactam (ZOSYN) IVPB 3.375 g        3.375 g 100 mL/hr over 30 Minutes Intravenous  Once 10/15/23 1343 10/15/23 1503   10/15/23 1400  DAPTOmycin (CUBICIN) IVPB 500 mg/16mL premix  Status:  Discontinued        8 mg/kg  64.1 kg 100 mL/hr over 30 Minutes Intravenous Daily 10/15/23 1045 10/22/23 1321   10/14/23 1800  vancomycin (VANCOREADY) IVPB 1250 mg/250 mL        1,250 mg 166.7 mL/hr over 90 Minutes Intravenous  Once 10/14/23 1740 10/14/23 2255   10/14/23 1400  piperacillin-tazobactam (ZOSYN) IVPB 3.375 g  Status:  Discontinued        3.375 g 12.5 mL/hr over 240 Minutes Intravenous Every 8 hours 10/14/23 0845 10/22/23 1321   10/14/23 0900  piperacillin-tazobactam (ZOSYN) IVPB 3.375 g        3.375 g 100 mL/hr over 30 Minutes Intravenous NOW 10/14/23 0844 10/14/23 1002   10/05/23 1200  piperacillin-tazobactam (ZOSYN) IVPB 3.375 g        3.375 g 12.5 mL/hr over 240 Minutes Intravenous Every 8 hours 10/05/23 0721 10/09/23 1716   10/04/23 2000  piperacillin-tazobactam (ZOSYN) IVPB 3.375 g  Status:  Discontinued        3.375 g 12.5 mL/hr over 240 Minutes Intravenous Every 8 hours 10/04/23 1827 10/05/23 0721   09/27/23 2315  piperacillin-tazobactam (ZOSYN) IVPB 3.375 g  Status:  Discontinued        3.375 g 12.5 mL/hr over 240 Minutes Intravenous Every 8 hours 09/27/23 2305 10/02/23 1106         Subjective: Patient sitting up in recliner.  States she feels better  today than she did yesterday.  Complaining of right buttocks pain.  States buttocks feels raw.  Denies any chest pain or shortness of breath.  Still with diffuse abdominal pain unchanged.   Objective: Vitals:   11/03/23 1341 11/03/23 1700 11/03/23 2205 11/04/23 0602  BP: (!) 143/80  121/71 (!) 119/51  Pulse: 77  74   Resp: 20  18 18   Temp: 98.4 F (36.9 C) 98.2 F (36.8 C) 97.9 F (36.6 C) 98.1 F (36.7 C)  TempSrc: Oral   Oral  SpO2: 97%  97% 98%  Weight:      Height:        Intake/Output Summary (Last 24 hours) at 11/04/2023 1312 Last data filed at 11/04/2023 0930 Gross per 24 hour  Intake 240 ml  Output 705 ml  Net -465 ml   Filed Weights   10/25/23 0713 10/28/23 0753 10/31/23 0500  Weight: 62.2 kg 58.2 kg 59.9 kg    Examination:  General exam: NAD Respiratory system: Lungs clear to auscultation bilaterally.  No wheezes, no crackles, no rhonchi.  Fair air movement.  Speaking in full sentences.  Cardiovascular system: RRR no murmurs rubs or  gallops.  No JVD.  No pitting lower extremity edema.  Gastrointestinal system: Abdomen is soft, nondistended, some diffuse tenderness to palpation.  Manage of a mid abdominal wound.  Ostomy with brown stool.  Positive bowel sounds.   Central nervous system: Alert and oriented. No focal neurological deficits. Extremities: Symmetric 5 x 5 power. Skin: No rashes, lesions or ulcers Psychiatry: Judgement and insight appear normal. Mood & affect appropriate.     Data Reviewed: I have personally reviewed following labs and imaging studies  CBC: Recent Labs  Lab 11/01/23 0342 11/02/23 0425 11/03/23 0306 11/04/23 0320  WBC 6.5 6.9 8.0 8.3  NEUTROABS 3.6  --   --   --   HGB 10.5* 11.0* 11.0* 10.7*  HCT 32.3* 34.5* 34.5* 33.3*  MCV 96.4 97.7 97.2 96.0  PLT 170 173 192 199    Basic Metabolic Panel: Recent Labs  Lab 10/29/23 0327 11/01/23 0342 11/02/23 0425 11/03/23 0306 11/04/23 0320  NA 130* 133* 134* 128* 128*  K 3.8  3.7 4.0 3.9 3.5  CL 101 102 103 97* 97*  CO2 23 23 24 25 25   GLUCOSE 112* 116* 100* 109* 109*  BUN 20 27* 30* 29* 23  CREATININE 0.48 0.56 0.52 0.70 0.62  CALCIUM 8.5* 8.9 9.1 9.1 8.9  MG 1.9 1.8 1.8 1.9 1.7  PHOS 3.0 3.7 4.0 4.3  --     GFR: Estimated Creatinine Clearance: 47.9 mL/min (by C-G formula based on SCr of 0.62 mg/dL).  Liver Function Tests: Recent Labs  Lab 10/29/23 0327 11/01/23 0342 11/02/23 0425 11/03/23 0306  AST 36 30 35 38  ALT 58* 47* 51* 59*  ALKPHOS 152* 128* 124 136*  BILITOT 0.4 0.6 0.9 1.2*  PROT 7.3 7.7 7.6 7.9  ALBUMIN 2.4* 2.4* 2.4* 2.5*    CBG: No results for input(s): "GLUCAP" in the last 168 hours.   No results found for this or any previous visit (from the past 240 hour(s)).       Radiology Studies: No results found.      Scheduled Meds:  Chlorhexidine Gluconate Cloth  6 each Topical Daily   docusate sodium  100 mg Oral BID   feeding supplement  1 Container Oral BID BM   Gerhardt's butt cream   Topical BID   ketotifen  2 drop Both Eyes BID   multivitamin with minerals  1 tablet Oral Daily   polyethylene glycol  17 g Oral Daily   sodium chloride flush  10-40 mL Intracatheter Q12H   Continuous Infusions:     LOS: 38 days    Time spent: 35 minutes    Ramiro Harvest, MD [facility]   To contact the attending provider between 7A-7P or the covering provider during after hours 7P-7A, please log into the web site [email or web address] and access using [facility] password for that web site. If you do not have the password, please call the hospital operator.  11/04/2023, 1:12 PM

## 2023-11-04 NOTE — Progress Notes (Addendum)
34 Days Post-Op   Subjective/Chief Complaint: No complaints today and reports that she is comfortable.   Objective: Vital signs in last 24 hours: Temp:  [97.9 F (36.6 C)-98.4 F (36.9 C)] 98.1 F (36.7 C) (11/24 0602) Pulse Rate:  [74-77] 74 (11/23 2205) Resp:  [18-20] 18 (11/24 0602) BP: (119-143)/(51-80) 119/51 (11/24 0602) SpO2:  [97 %-98 %] 98 % (11/24 0602) Last BM Date : 11/03/23  Intake/Output from previous day: 11/23 0701 - 11/24 0700 In: 480 [P.O.:480] Out: 805 [Urine:600; Drains:5; Stool:200] Intake/Output this shift: No intake/output data recorded.  Alert, calm Unlabored respirations Abdomen is soft, benign.  Midline wound is granulating and shallow without any unusual drainage.  Ostomy with dark liquid output.  Drain output with very scant slightly more purulent appearing output today; output recorded as 5/ 24 hours  Lab Results:  Recent Labs    11/03/23 0306 11/04/23 0320  WBC 8.0 8.3  HGB 11.0* 10.7*  HCT 34.5* 33.3*  PLT 192 199    BMET Recent Labs    11/03/23 0306 11/04/23 0320  NA 128* 128*  K 3.9 3.5  CL 97* 97*  CO2 25 25  GLUCOSE 109* 109*  BUN 29* 23  CREATININE 0.70 0.62  CALCIUM 9.1 8.9   PT/INR No results for input(s): "LABPROT", "INR" in the last 72 hours. ABG No results for input(s): "PHART", "HCO3" in the last 72 hours.  Invalid input(s): "PCO2", "PO2"  Studies/Results: No results found.  Anti-infectives: Anti-infectives (From admission, onward)    Start     Dose/Rate Route Frequency Ordered Stop   10/15/23 1800  vancomycin (VANCOCIN) IVPB 1000 mg/200 mL premix  Status:  Discontinued        1,000 mg 200 mL/hr over 60 Minutes Intravenous Every 24 hours 10/14/23 1740 10/15/23 1045   10/15/23 1430  piperacillin-tazobactam (ZOSYN) IVPB 3.375 g        3.375 g 100 mL/hr over 30 Minutes Intravenous  Once 10/15/23 1343 10/15/23 1503   10/15/23 1400  DAPTOmycin (CUBICIN) IVPB 500 mg/2mL premix  Status:  Discontinued         8 mg/kg  64.1 kg 100 mL/hr over 30 Minutes Intravenous Daily 10/15/23 1045 10/22/23 1321   10/14/23 1800  vancomycin (VANCOREADY) IVPB 1250 mg/250 mL        1,250 mg 166.7 mL/hr over 90 Minutes Intravenous  Once 10/14/23 1740 10/14/23 2255   10/14/23 1400  piperacillin-tazobactam (ZOSYN) IVPB 3.375 g  Status:  Discontinued        3.375 g 12.5 mL/hr over 240 Minutes Intravenous Every 8 hours 10/14/23 0845 10/22/23 1321   10/14/23 0900  piperacillin-tazobactam (ZOSYN) IVPB 3.375 g        3.375 g 100 mL/hr over 30 Minutes Intravenous NOW 10/14/23 0844 10/14/23 1002   10/05/23 1200  piperacillin-tazobactam (ZOSYN) IVPB 3.375 g        3.375 g 12.5 mL/hr over 240 Minutes Intravenous Every 8 hours 10/05/23 0721 10/09/23 1716   10/04/23 2000  piperacillin-tazobactam (ZOSYN) IVPB 3.375 g  Status:  Discontinued        3.375 g 12.5 mL/hr over 240 Minutes Intravenous Every 8 hours 10/04/23 1827 10/05/23 0721   09/27/23 2315  piperacillin-tazobactam (ZOSYN) IVPB 3.375 g  Status:  Discontinued        3.375 g 12.5 mL/hr over 240 Minutes Intravenous Every 8 hours 09/27/23 2305 10/02/23 1106       Assessment/Plan: POD 34 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  [name] - CT 10/24 with hematoma in the pelvis adjacent to small bowel anastomosis and ileus vs obstruction  - IR 11/1: drain placement for small bowel anastomotic leak/ abscess drainage - CT 11/8 with resolution in previous right hemipelvic collection and no leakage of enteric contrast - drain injection study 11/11 confirms fistulous connection - drain left in place. Stop flushing drain and placed to gravity.  Anticipate drain study next week - reg diet, supplements; appreciate ongoing care from dietitian.  P.o. intake has been okay the last 3 days.  TPN holiday. - getting in enough nutritionally will be key to trying to heal fistula which fortunately appears to be low output at this point - continue daily  dressing changes  - Needs mobilization and physical conditioning -Wean off IV medication/narcotics.    FEN: reg diet -appreciate ongoing care from dietitian, we will see how her appetite is off the TPN Void trial today Anticipate drain study this coming week Okay for transition to SNF when bed available VTE: IVC Filter  ID: no current abx     ABL anemia secondary to post-op hematomas and hematuria  Acute PE and LLE DVT Pancreatic tail cyst Thyroid nodule  Chronic HFpEF Paroxysmal A. Fib Pre-diabetes RLS    Elizabeth Odom 11/04/2023

## 2023-11-05 ENCOUNTER — Other Ambulatory Visit: Payer: Self-pay | Admitting: Physician Assistant

## 2023-11-05 ENCOUNTER — Inpatient Hospital Stay (HOSPITAL_COMMUNITY): Payer: PPO

## 2023-11-05 DIAGNOSIS — R338 Other retention of urine: Secondary | ICD-10-CM | POA: Diagnosis not present

## 2023-11-05 DIAGNOSIS — I2699 Other pulmonary embolism without acute cor pulmonale: Secondary | ICD-10-CM | POA: Diagnosis not present

## 2023-11-05 DIAGNOSIS — I824Y2 Acute embolism and thrombosis of unspecified deep veins of left proximal lower extremity: Secondary | ICD-10-CM | POA: Diagnosis not present

## 2023-11-05 DIAGNOSIS — K56609 Unspecified intestinal obstruction, unspecified as to partial versus complete obstruction: Secondary | ICD-10-CM | POA: Diagnosis not present

## 2023-11-05 HISTORY — PX: IR SINUS/FIST TUBE CHK-NON GI: IMG673

## 2023-11-05 LAB — BASIC METABOLIC PANEL
Anion gap: 10 (ref 5–15)
BUN: 27 mg/dL — ABNORMAL HIGH (ref 8–23)
CO2: 25 mmol/L (ref 22–32)
Calcium: 9.1 mg/dL (ref 8.9–10.3)
Chloride: 97 mmol/L — ABNORMAL LOW (ref 98–111)
Creatinine, Ser: 0.69 mg/dL (ref 0.44–1.00)
GFR, Estimated: >60 mL/min (ref >60)
Glucose, Bld: 124 mg/dL — ABNORMAL HIGH (ref 70–99)
Potassium: 3.6 mmol/L (ref 3.5–5.1)
Sodium: 132 mmol/L — ABNORMAL LOW (ref 135–145)

## 2023-11-05 LAB — CBC
HCT: 34.2 % — ABNORMAL LOW (ref 36.0–46.0)
Hemoglobin: 11.4 g/dL — ABNORMAL LOW (ref 12.0–15.0)
MCH: 31.7 pg (ref 26.0–34.0)
MCHC: 33.3 g/dL (ref 30.0–36.0)
MCV: 95 fL (ref 80.0–100.0)
Platelets: 247 10*3/uL (ref 150–400)
RBC: 3.6 MIL/uL — ABNORMAL LOW (ref 3.87–5.11)
RDW: 15.1 % (ref 11.5–15.5)
WBC: 7.4 10*3/uL (ref 4.0–10.5)
nRBC: 0 % (ref 0.0–0.2)

## 2023-11-05 LAB — MAGNESIUM: Magnesium: 2.2 mg/dL (ref 1.7–2.4)

## 2023-11-05 LAB — PHOSPHORUS: Phosphorus: 3.4 mg/dL (ref 2.5–4.6)

## 2023-11-05 MED ORDER — FLUCONAZOLE IN SODIUM CHLORIDE 200-0.9 MG/100ML-% IV SOLN
200.0000 mg | Freq: Once | INTRAVENOUS | Status: AC
  Administered 2023-11-05: 200 mg via INTRAVENOUS
  Filled 2023-11-05: qty 100

## 2023-11-05 MED ORDER — FLUCONAZOLE 100 MG PO TABS: 200.0000 mg | ORAL_TABLET | Freq: Every day | ORAL | Status: DC

## 2023-11-05 MED ORDER — ONDANSETRON HCL 4 MG/2ML IJ SOLN
INTRAMUSCULAR | Status: AC
  Filled 2023-11-05: qty 2

## 2023-11-05 MED ORDER — IOHEXOL 300 MG/ML  SOLN
10.0000 mL | Freq: Once | INTRAMUSCULAR | Status: AC | PRN
  Administered 2023-11-05: 6 mL

## 2023-11-05 MED ORDER — CLOTRIMAZOLE 1 % EX CREA
TOPICAL_CREAM | Freq: Two times a day (BID) | CUTANEOUS | Status: DC
  Filled 2023-11-05 (×2): qty 15

## 2023-11-05 NOTE — Progress Notes (Signed)
35 Days Post-Op   Subjective/Chief Complaint: C/o pain over buttock. Says she did not eat this morning due to nausea.   Objective: Vital signs in last 24 hours: Temp:  [98.1 F (36.7 C)-98.2 F (36.8 C)] 98.1 F (36.7 C) (11/25 0546) Pulse Rate:  [76-106] 86 (11/25 0546) Resp:  [18-20] 18 (11/25 0546) BP: (106-125)/(57-63) 106/63 (11/25 0546) SpO2:  [98 %-99 %] 98 % (11/25 0546) Last BM Date : 11/04/23  Intake/Output from previous day: 11/24 0701 - 11/25 0700 In: 700 [P.O.:700] Out: 315 [Urine:300; Drains:15] Intake/Output this shift: No intake/output data recorded.  Alert, calm Unlabored respirations Abdomen is soft, benign.  Midline wound is granulating and shallow without any unusual drainage.  Ostomy with brown semi-solid output. Interval removal of drain.  GU: there is skin breakdown over the buttock that appears pressure and moisture related. Also red rash of the perineal/inner thigh with sattelite lesions - might be yeast.  Lab Results:  Recent Labs    11/04/23 0320 11/05/23 0315  WBC 8.3 7.4  HGB 10.7* 11.4*  HCT 33.3* 34.2*  PLT 199 247    BMET Recent Labs    11/04/23 0320 11/05/23 0315  NA 128* 132*  K 3.5 3.6  CL 97* 97*  CO2 25 25  GLUCOSE 109* 124*  BUN 23 27*  CREATININE 0.62 0.69  CALCIUM 8.9 9.1   PT/INR No results for input(s): "LABPROT", "INR" in the last 72 hours. ABG No results for input(s): "PHART", "HCO3" in the last 72 hours.  Invalid input(s): "PCO2", "PO2"  Studies/Results: IR Sinus/Fist Tube Chk-Non GI  Result Date: 11/05/2023 INDICATION: 82 year old with postoperative pelvic abscess. Previous drain injection demonstrated a bowel fistula. Minimal output from the drain and evaluate for a bowel fistula. EXAM: SINUS TRACT INJECTION / FISTULOGRAM COMPARISON:  None Available. MEDICATIONS: None ANESTHESIA/SEDATION: None COMPLICATIONS: None immediate. FLUOROSCOPY: Radiation Exposure Index (as provided by the fluoroscopic device): 31  mGy Kerma TECHNIQUE: Patient was placed in left lateral decubitus position on the interventional table. Scout image was obtained. PROCEDURE: Drain was injected with 6 mL Omnipaque 300. Following the injection, the drain was cut and removed without complication. Bandage placed at the old drain site. FINDINGS: Pigtail drain was in the posterior aspect of the pelvis. Minimal collection around the drain itself. Majority of the contrast was draining around the tube towards the skin. No evidence for a bowel fistula. IMPRESSION: 1. No significant collection around the drain. No evidence for a bowel fistula. 2. Drain was removed. Electronically Signed   By: Richarda Overlie M.D.   On: 11/05/2023 10:35    Anti-infectives: Anti-infectives (From admission, onward)    Start     Dose/Rate Route Frequency Ordered Stop   10/15/23 1800  vancomycin (VANCOCIN) IVPB 1000 mg/200 mL premix  Status:  Discontinued        1,000 mg 200 mL/hr over 60 Minutes Intravenous Every 24 hours 10/14/23 1740 10/15/23 1045   10/15/23 1430  piperacillin-tazobactam (ZOSYN) IVPB 3.375 g        3.375 g 100 mL/hr over 30 Minutes Intravenous  Once 10/15/23 1343 10/15/23 1503   10/15/23 1400  DAPTOmycin (CUBICIN) IVPB 500 mg/54mL premix  Status:  Discontinued        8 mg/kg  64.1 kg 100 mL/hr over 30 Minutes Intravenous Daily 10/15/23 1045 10/22/23 1321   10/14/23 1800  vancomycin (VANCOREADY) IVPB 1250 mg/250 mL        1,250 mg 166.7 mL/hr over 90 Minutes Intravenous  Once 10/14/23 1740  10/14/23 2255   10/14/23 1400  piperacillin-tazobactam (ZOSYN) IVPB 3.375 g  Status:  Discontinued        3.375 g 12.5 mL/hr over 240 Minutes Intravenous Every 8 hours 10/14/23 0845 10/22/23 1321   10/14/23 0900  piperacillin-tazobactam (ZOSYN) IVPB 3.375 g        3.375 g 100 mL/hr over 30 Minutes Intravenous NOW 10/14/23 0844 10/14/23 1002   10/05/23 1200  piperacillin-tazobactam (ZOSYN) IVPB 3.375 g        3.375 g 12.5 mL/hr over 240 Minutes Intravenous  Every 8 hours 10/05/23 0721 10/09/23 1716   10/04/23 2000  piperacillin-tazobactam (ZOSYN) IVPB 3.375 g  Status:  Discontinued        3.375 g 12.5 mL/hr over 240 Minutes Intravenous Every 8 hours 10/04/23 1827 10/05/23 0721   09/27/23 2315  piperacillin-tazobactam (ZOSYN) IVPB 3.375 g  Status:  Discontinued        3.375 g 12.5 mL/hr over 240 Minutes Intravenous Every 8 hours 09/27/23 2305 10/02/23 1106       Assessment/Plan: POD 34 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 [name] - CT 10/24 with hematoma in the pelvis adjacent to small bowel anastomosis and ileus vs obstruction  - IR 11/1: drain placement for small bowel anastomotic leak/ abscess drainage - CT 11/8 with resolution in previous right hemipelvic collection and no leakage of enteric contrast - drain injection study 11/25 shows resolution of fistula and drain was removed. - reg diet, supplements; appreciate ongoing care from dietitian.  P.o. intake has been okay (700) - not enough bu better than previous. Fistula has resolved so hopefully can increase PO intake and avoid resumption of  TPN. - continue daily dressing changes  - Needs mobilization and physical conditioning -Wean off IV medication/narcotics.    FEN: reg diet -appreciate ongoing care from dietitian, we will see how her appetite is off the TPN Foley - removed. Has some urinary incontinence. Moisture related skin breakdown and possible yeast dermatitis. Will ask WOC to see. May need topical antifungal in addition to barrier cream.  Okay for transition to SNF when bed available VTE: IVC Filter  ID: no current abx     ABL anemia secondary to post-op hematomas and hematuria  Acute PE and LLE DVT Pancreatic tail cyst Thyroid nodule  Chronic HFpEF Paroxysmal A. Fib Pre-diabetes RLS    Adam Phenix 11/05/2023

## 2023-11-05 NOTE — Plan of Care (Signed)
?  Problem: Clinical Measurements: ?Goal: Will remain free from infection ?Outcome: Progressing ?  ?

## 2023-11-05 NOTE — Consult Note (Signed)
WOC Nurse Consult Note: Reason for Consult:  evaluate rash  Wound type: candida; inner thighs and buttocks  Pressure Injury POA: NA Measurement:NA Wound bed: intense redness centrally with satellite lesions  Drainage (amount, consistency, odor) none Periwound:  intact  Dressing procedure/placement/frequency: DC hydrocortisone (it is in the Gerhardt's) DC Desitin (no need for both Gerhardt's and Destin)  Continue Gerhardt's TID. CCS PA to add PO Diflucan.  Discussed with patient.    Dearra Myhand [facility] MSN, RN,CWOCN, CNS, CWON-AP [phone])

## 2023-11-05 NOTE — Procedures (Signed)
Interventional Radiology Procedure:   Indications: Follow up pelvic abscess, evaluate for a bowel fistula  Procedure: Drain injection and removal  Findings: No residual collection around the drain and no fistula.    Complications: None     EBL: Minimal  Plan: Drain was removed.   Janeane Cozart R. Lowella Dandy, MD  Pager: [phone]

## 2023-11-05 NOTE — Progress Notes (Signed)
Physical Therapy Treatment Patient Details Name: Elizabeth Odom MRN: [id number] DOB: 04/11/1941 Today's Date: 11/05/2023   History of Present Illness Patient is a 82 year old female who presented on 10/17 with acute colitis. on10/21, patient underwent ex laparotomy with hartman's resection and colostomy and small bowel resection. PMH: a fib, RLS, syncope, cervical CA, cervical spine fusions, anemia    PT Comments  Pt declined standing or amb attempt. Unable to return to bed d/t air mattress not functioning, RN made aware. Pt agreeable to LE exercises, reports being very tired and requires cues throughout for participation, keeps eyes closed most of the time but does respond verbally. D/c plan remains appropriate. Continue PT POC   If plan is discharge home, recommend the following: A lot of help with walking and/or transfers;A lot of help with bathing/dressing/bathroom;Assistance with cooking/housework;Help with stairs or ramp for entrance;Two people to help with walking and/or transfers   Can travel by private vehicle     No  Equipment Recommendations  None recommended by PT    Recommendations for Other Services       Precautions / Restrictions Precautions Precautions: Fall Precaution Comments: colostomy, sacral wound, abd  surgical wound Restrictions Weight Bearing Restrictions: No Other Position/Activity Restrictions: abdominal surgery, colostomy     Mobility  Bed Mobility               General bed mobility comments: in recliner on arrival, repositioned pt in chair and did not return pt to bed d/t air mattress not functioning    Transfers                   General transfer comment: pt declined    Ambulation/Gait               General Gait Details: pt declined   Stairs             Wheelchair Mobility     Tilt Bed    Modified Rankin (Stroke Patients Only)       Balance                                             Cognition Arousal: Alert Behavior During Therapy: WFL for tasks assessed/performed Overall Cognitive Status: Within Functional Limits for tasks assessed                                 General Comments: patient needed increased encouragement to engage in session on this date.        Exercises General Exercises - Lower Extremity Ankle Circles/Pumps: AROM, 15 reps, Both Quad Sets: AROM, Both, 10 reps Gluteal Sets: AROM, Both, 10 reps Heel Slides: AAROM, Both, 10 reps Hip ABduction/ADduction: AAROM, Both, 10 reps    General Comments        Pertinent Vitals/Pain Pain Assessment Pain Assessment: Faces Faces Pain Scale: Hurts even more Pain Location: buttocks/abdomen Pain Descriptors / Indicators: Constant Pain Intervention(s): Limited activity within patient's tolerance, Monitored during session, Repositioned, Premedicated before session    Home Living                          Prior Function            PT Goals (current goals can now be found in  the care plan section) Acute Rehab PT Goals 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[employer] PT Goal Formulation: With patient Time For Goal Achievement: 11/14/23 Potential to Achieve Goals: Good Progress towards PT goals: Progressing toward goals    Frequency    Min 1X/week      PT Plan      Co-evaluation              AM-PAC PT "6 Clicks" Mobility   Outcome Measure  Help needed turning from your back to your side while in a flat bed without using bedrails?: A Lot Help needed moving from lying on your back to sitting on the side of a flat bed without using bedrails?: A Lot Help needed moving to and from a bed to a chair (including a wheelchair)?: A Lot Help needed standing up from a chair using your arms (e.g., wheelchair or bedside chair)?: A Lot Help needed to walk in hospital room?: Total Help needed climbing 3-5 steps with a railing? : Total 6 Click  Score: 10    End of Session   Activity Tolerance: Patient limited by fatigue;Patient limited by pain Patient left: in chair;with call bell/phone within reach Nurse Communication: Mobility status PT Visit Diagnosis: Difficulty in walking, not elsewhere classified (R26.2);Muscle weakness (generalized) (M62.81);Unsteadiness on feet (R26.81);Pain Pain - part of body:  (abdomen and sacrum)     Time: 1610-9604 PT Time Calculation (min) (ACUTE ONLY): 16 min  Charges:    $Therapeutic Exercise: 8-22 mins PT General Charges $$ ACUTE PT VISIT: 1 Visit                     Buna Cuppett, PT  Acute Rehab Dept [facility]) [phone]  11/05/2023    [facility] 11/05/2023, 2:52 PM

## 2023-11-05 NOTE — Progress Notes (Signed)
PROGRESS NOTE    Elizabeth Odom  WUJ:[id number] DOB: [birth year] DOA: 09/27/2023 PCP: [name], FNP    Chief Complaint  Patient presents with   Abdominal Pain    Brief Narrative:  82 year old with past medical history significant for A-fib, RLS, anemia presented to the ED 10/17 with recurrent abdominal pain, nausea emesis, diarrhea intolerance of oral intake.  She was evaluated in the ED 10/10 for abdominal pain, decreased appetite, fever, diagnosed with possible colitis noted on CT abdomen and pelvis discharged on Augmentin.  She had recurrence of symptoms and presented back to the hospital.  CT initially showed proximal sigmoid colon wall thickening with upstream colonic dilation with air-fluid levels and large amount of stool compatible with obstruction.  9 mm cystic lesion in the tail of the pancreas and subcentimeter lucent lesion in L1 vertebral body.  GI consulted, patient refused colonoscopy but agreed to sigmoidoscopy showing stricture versus mass at the rectosigmoid region.  Scope could not be advanced past this.  Surgery was consulted and she underwent a small bowel resection and Hartman's resection and  colostomy on 10/01/2023.  She continued to have postoperative ileus managed by general surgery.  NG remains in place to suction.   Hospital course also complicated by acute PE and left lower extremity DVT she was started on heparin.  She subsequently developed acute blood loss anemia and subsequent pelvic hematoma near the small bowel anastomosis as well as notable hematuria.  Urology was consulted.  Signed off after hematuria resolution.  Given high risk for bleeding with notable PE DVT evaluated by IR and placed IVC filter 10/05/2023.  Patient required total of 4 units of packed red blood cell.   As her bowel function did not return she underwent a repeat CT abdomen and pelvis on 10/31st showing anastomotic leak into 7 x 6 cm complex fluid collection posteriorly in the pelvis.   IR was consulted and underwent CT-guided percutaneous drain placement into pelvic abscess.  Fluid grew MRSA.  ID consulted and once transitioned to daptomycin and Zosyn.   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  ID recommended no further need of antibiotics.    Needs to be wean off TPN to be able to go to SNF   Assessment & Plan:   Principal Problem:   Large bowel obstruction (HCC) Active Problems:   Acute colitis   Left leg DVT (HCC)   ABLA (acute blood loss anemia)   Palliative care encounter   Counseling and coordination of care   Need for emotional support   Goals of care, counseling/discussion   High risk medication use   Pain   Medication management   Pressure injury of skin   Malnutrition of moderate degree   Iron deficiency anemia due to chronic blood loss   Acute pulmonary embolism (HCC)   Hyponatremia   Acute urinary retention   #1 large bowel obstruction status post Hartman's resection, colostomy complicated by anastomosis leak of pelvic abscess requiring drain placement/fistula connection with colon staple line anastomosis/postop ileus versus partial small bowel obstruction -Patient noted to have presented abdominal pain with CT abdomen and pelvis done 09/27/2023 with proximal sigmoid colon thickening. -Patient seen in consultation by GI underwent sigmoidoscopy 09/29/2023 which showed a tight stricture found to have a mass, biopsy was negative for malignancy suggesting inflammation. -General Surgery consulted status post exploratory laparotomy Hartman's resection 10/01/2023 with small bowel resection and ostomy per Dr. [name]. -Repeat  CT abdomen and pelvis 10/11/2023 with small leak at the anastomosis going into 6.7 cm fluid collection. -Patient seen in consultation by IR underwent drain catheter placement of pelvic abscess on 10/12/2023 with cultures growing MRSA. -Patient seen in consultation  by ID and treated with daptomycin and Zosyn. -Repeat CT abdomen and pelvis 10/18/2023 with percutaneous peak tail drain catheter with no persistent anastomotic leak or fistula. -10/22/2023 pelvic drain injection study showed fistulous communication with colon/staple line anastomosis with drain to remain in place. -Per ID no further indication for antibiotics and antibiotics subsequently discontinued. -Was on TPN which has been weaned off per general surgery recommendations. -Diet advanced to regular diet which patient seems to be tolerating. -Continue daily dressings. -Mobilization. -Patient assessed by IR and patient underwent pelvic drain injection study and drain removal today 11/05/2023.   -Per general surgery.   2.  Hyponatremia -Was on D10 which was subsequently discontinued with improvement with hyponatremia. -Sodium fluctuating with sodium at 132 this morning. -Follow.  3.  Acute blood loss anemia status post perianastomotic hematoma with hematuria -Status post transfusion 4 units PRBCs during the hospitalization. -Status post IVC filter. -Seen in consultation by urology no indication for cystoscopy or intervention at this time. -Hemoglobin currently stable at 11.4. -Follow H&H.  4.  Acute PE (small to moderate clot burden) without right heart strain/acute left lower extremity DVT -Patient noted unable to tolerate anticoagulation status post IVC filter placement 10/05/2023. -2D echo with no evidence of right heart strain. -CT scan with no evidence of right heart strain. -Outpatient follow-up with PCP.  5.  Thyroid nodule in the inferior aspect of the isthmus -Outpatient follow-up.  6.  Paroxysmal atrial fibrillation -Rate controlled.   -Not on anticoagulation secondary to problem #3.  7.  Hypokalemia/hypophosphatemia/hypomagnesemia -Patient was on TPN which has been weaned off. -Potassium currently at 3.5, phosphorus at 4.3, magnesium at 1.7.  -Magnesium 2 g IV x  1. -Repeat labs in AM.   8.  Urinary retention -Urine output of 600 cc over the past 24 hours. -Per general surgery patient for voiding trial today.  9.  Hypotension -Resolved. -Follow.  10.  Leukocytosis -Resolved.  11.  Moderate protein calorie malnutrition -TPN weaned off.  Diet advanced.   12.  Pressure injury right Botox stage II/MASD Pressure Injury 10/08/23 Buttocks Right;Mid Stage 2 -  Partial thickness loss of dermis presenting as a shallow open injury with a red, pink wound bed without slough. (Active)  10/08/23 0800  Location: Buttocks  Location Orientation: Right;Mid  Staging: Stage 2 -  Partial thickness loss of dermis presenting as a shallow open injury with a red, pink wound bed without slough.  Wound Description (Comments):   Present on Admission:         DVT prophylaxis: SCDs Code Status: DNR Family Communication: Updated patient.  No family at bedside. Disposition: SNF once weaned off TPN and completion of drain injection study on 11/05/2023 and further recommendations made per IR.  Cleared by general surgery.  Status is: Inpatient Remains inpatient appropriate because: Severity of illness   Consultants:  Gastroenterology: Dr. Lorenso Quarry 09/28/2023 General Surgery: Dr. [name] 09/29/2023 Wound care consult PCCM: Dr. Jamison Neighbor 10/04/2023 Urology: Dr. Cardell Peach 10/05/2023 Interventional radiology: Dr. Deanne Coffer 10/05/2023 Palliative care: Dr. Patterson Hammersmith 10/05/2023 Interventional radiology: Dr.Mugweru 10/12/2023 ID: Dr. Thedore Mins 10/13/2023   Procedures:  Flexible sigmoidoscopy per Dr. Marca Ancona GI 09/29/2023 IVC gram and IV filter placement per IR: Dr. Deanne Coffer 10/05/2023 Drainage catheter placement into pelvic abscess per IR: Dr.Mugweru 10/12/2023 CT  abdomen and pelvis 09/27/2023, 10/11/2023, 10/19/2023 CT chest 09/30/2023 MRI abdomen 09/28/2023 Drain injection/fistulogram/abscessogram per IR 10/22/2023 2D echo 10/01/2023 Lower extremity Dopplers 10/04/2023 CT abdomen and  pelvis without contrast 10/04/2023 Sigmoid colectomy with colostomy creation/Hartman's procedure/small bowel resection with anastomosis: Per general surgery: Dr. [name] 10/01/2023 Transfusion 4 units PRBCs During injection and removal per IR: Dr. Lowella Dandy 11/05/2023  Antimicrobials:  Anti-infectives (From admission, onward)    Start     Dose/Rate Route Frequency Ordered Stop   10/15/23 1800  vancomycin (VANCOCIN) IVPB 1000 mg/200 mL premix  Status:  Discontinued        1,000 mg 200 mL/hr over 60 Minutes Intravenous Every 24 hours 10/14/23 1740 10/15/23 1045   10/15/23 1430  piperacillin-tazobactam (ZOSYN) IVPB 3.375 g        3.375 g 100 mL/hr over 30 Minutes Intravenous  Once 10/15/23 1343 10/15/23 1503   10/15/23 1400  DAPTOmycin (CUBICIN) IVPB 500 mg/21mL premix  Status:  Discontinued        8 mg/kg  64.1 kg 100 mL/hr over 30 Minutes Intravenous Daily 10/15/23 1045 10/22/23 1321   10/14/23 1800  vancomycin (VANCOREADY) IVPB 1250 mg/250 mL        1,250 mg 166.7 mL/hr over 90 Minutes Intravenous  Once 10/14/23 1740 10/14/23 2255   10/14/23 1400  piperacillin-tazobactam (ZOSYN) IVPB 3.375 g  Status:  Discontinued        3.375 g 12.5 mL/hr over 240 Minutes Intravenous Every 8 hours 10/14/23 0845 10/22/23 1321   10/14/23 0900  piperacillin-tazobactam (ZOSYN) IVPB 3.375 g        3.375 g 100 mL/hr over 30 Minutes Intravenous NOW 10/14/23 0844 10/14/23 1002   10/05/23 1200  piperacillin-tazobactam (ZOSYN) IVPB 3.375 g        3.375 g 12.5 mL/hr over 240 Minutes Intravenous Every 8 hours 10/05/23 0721 10/09/23 1716   10/04/23 2000  piperacillin-tazobactam (ZOSYN) IVPB 3.375 g  Status:  Discontinued        3.375 g 12.5 mL/hr over 240 Minutes Intravenous Every 8 hours 10/04/23 1827 10/05/23 0721   09/27/23 2315  piperacillin-tazobactam (ZOSYN) IVPB 3.375 g  Status:  Discontinued        3.375 g 12.5 mL/hr over 240 Minutes Intravenous Every 8 hours 09/27/23 2305 10/02/23 1106          Subjective: Sitting up in recliner.  Complaining of buttocks pain.  Denies any chest pain or shortness of breath.  Abdominal pain improving.    Objective: Vitals:   11/04/23 0602 11/04/23 1334 11/04/23 2152 11/05/23 0546  BP: (!) 119/51 (!) 125/58 (!) 124/57 106/63  Pulse:  76 (!) 106 86  Resp: 18 20 18 18   Temp: 98.1 F (36.7 C) 98.2 F (36.8 C) 98.1 F (36.7 C) 98.1 F (36.7 C)  TempSrc: Oral Oral Oral Oral  SpO2: 98% 98% 99% 98%  Weight:      Height:        Intake/Output Summary (Last 24 hours) at 11/05/2023 1308 Last data filed at 11/05/2023 1000 Gross per 24 hour  Intake 580 ml  Output 15 ml  Net 565 ml   Filed Weights   10/25/23 0713 10/28/23 0753 10/31/23 0500  Weight: 62.2 kg 58.2 kg 59.9 kg    Examination:  General exam: NAD Respiratory system: CTAB.  No wheezes, no crackles, no rhonchi.  Fair air movement.  Speaking in full sentences.  Cardiovascular system: Regular rate rhythm no murmurs rubs or gallops.  No JVD.  No lower extremity edema.  Gastrointestinal system: Abdomen is soft, nondistended, some diffuse tenderness to palpation.  Mid abdominal wound with dressing.  Ostomy with brown stool.  Positive bowel sounds.  Central nervous system: Alert and oriented. No focal neurological deficits. Extremities: Symmetric 5 x 5 power. Skin: No rashes, lesions or ulcers Psychiatry: Judgement and insight appear normal. Mood & affect appropriate.     Data Reviewed: I have personally reviewed following labs and imaging studies  CBC: Recent Labs  Lab 11/01/23 0342 11/02/23 0425 11/03/23 0306 11/04/23 0320 11/05/23 0315  WBC 6.5 6.9 8.0 8.3 7.4  NEUTROABS 3.6  --   --   --   --   HGB 10.5* 11.0* 11.0* 10.7* 11.4*  HCT 32.3* 34.5* 34.5* 33.3* 34.2*  MCV 96.4 97.7 97.2 96.0 95.0  PLT 170 173 192 199 247    Basic Metabolic Panel: Recent Labs  Lab 11/01/23 0342 11/02/23 0425 11/03/23 0306 11/04/23 0320 11/05/23 0315  NA 133* 134* 128* 128* 132*   K 3.7 4.0 3.9 3.5 3.6  CL 102 103 97* 97* 97*  CO2 23 24 25 25 25   GLUCOSE 116* 100* 109* 109* 124*  BUN 27* 30* 29* 23 27*  CREATININE 0.56 0.52 0.70 0.62 0.69  CALCIUM 8.9 9.1 9.1 8.9 9.1  MG 1.8 1.8 1.9 1.7 2.2  PHOS 3.7 4.0 4.3  --  3.4    GFR: Estimated Creatinine Clearance: 47.9 mL/min (by C-G formula based on SCr of 0.69 mg/dL).  Liver Function Tests: Recent Labs  Lab 11/01/23 0342 11/02/23 0425 11/03/23 0306  AST 30 35 38  ALT 47* 51* 59*  ALKPHOS 128* 124 136*  BILITOT 0.6 0.9 1.2*  PROT 7.7 7.6 7.9  ALBUMIN 2.4* 2.4* 2.5*    CBG: No results for input(s): "GLUCAP" in the last 168 hours.   No results found for this or any previous visit (from the past 240 hour(s)).       Radiology Studies: IR Sinus/Fist Tube Chk-Non GI  Result Date: 11/05/2023 INDICATION: 82 year old with postoperative pelvic abscess. Previous drain injection demonstrated a bowel fistula. Minimal output from the drain and evaluate for a bowel fistula. EXAM: SINUS TRACT INJECTION / FISTULOGRAM COMPARISON:  None Available. MEDICATIONS: None ANESTHESIA/SEDATION: None COMPLICATIONS: None immediate. FLUOROSCOPY: Radiation Exposure Index (as provided by the fluoroscopic device): 31 mGy Kerma TECHNIQUE: Patient was placed in left lateral decubitus position on the interventional table. Scout image was obtained. PROCEDURE: Drain was injected with 6 mL Omnipaque 300. Following the injection, the drain was cut and removed without complication. Bandage placed at the old drain site. FINDINGS: Pigtail drain was in the posterior aspect of the pelvis. Minimal collection around the drain itself. Majority of the contrast was draining around the tube towards the skin. No evidence for a bowel fistula. IMPRESSION: 1. No significant collection around the drain. No evidence for a bowel fistula. 2. Drain was removed. Electronically Signed   By: Richarda Overlie M.D.   On: 11/05/2023 10:35        Scheduled Meds:   Chlorhexidine Gluconate Cloth  6 each Topical Daily   docusate sodium  100 mg Oral BID   feeding supplement  1 Container Oral BID BM   Gerhardt's butt cream   Topical TID   ketotifen  2 drop Both Eyes BID   multivitamin with minerals  1 tablet Oral Daily   polyethylene glycol  17 g Oral Daily   sodium chloride flush  10-40 mL Intracatheter Q12H   Continuous Infusions:  LOS: 39 days    Time spent: 35 minutes    Ramiro Harvest, MD [facility]   To contact the attending provider between 7A-7P or the covering provider during after hours 7P-7A, please log into the web site [email or web address] and access using universal [place] password for that web site. If you do not have the password, please call the hospital operator.  11/05/2023, 1:08 PM

## 2023-11-06 DIAGNOSIS — I824Y2 Acute embolism and thrombosis of unspecified deep veins of left proximal lower extremity: Secondary | ICD-10-CM | POA: Diagnosis not present

## 2023-11-06 DIAGNOSIS — R338 Other retention of urine: Secondary | ICD-10-CM | POA: Diagnosis not present

## 2023-11-06 DIAGNOSIS — K56609 Unspecified intestinal obstruction, unspecified as to partial versus complete obstruction: Secondary | ICD-10-CM | POA: Diagnosis not present

## 2023-11-06 DIAGNOSIS — I2699 Other pulmonary embolism without acute cor pulmonale: Secondary | ICD-10-CM | POA: Diagnosis not present

## 2023-11-06 LAB — BASIC METABOLIC PANEL
Anion gap: 9 (ref 5–15)
BUN: 33 mg/dL — ABNORMAL HIGH (ref 8–23)
CO2: 25 mmol/L (ref 22–32)
Calcium: 9.1 mg/dL (ref 8.9–10.3)
Chloride: 98 mmol/L (ref 98–111)
Creatinine, Ser: 0.87 mg/dL (ref 0.44–1.00)
GFR, Estimated: >60 mL/min (ref >60)
Glucose, Bld: 120 mg/dL — ABNORMAL HIGH (ref 70–99)
Potassium: 3.6 mmol/L (ref 3.5–5.1)
Sodium: 132 mmol/L — ABNORMAL LOW (ref 135–145)

## 2023-11-06 LAB — CBC
HCT: 33.7 % — ABNORMAL LOW (ref 36.0–46.0)
Hemoglobin: 10.7 g/dL — ABNORMAL LOW (ref 12.0–15.0)
MCH: 30.7 pg (ref 26.0–34.0)
MCHC: 31.8 g/dL (ref 30.0–36.0)
MCV: 96.8 fL (ref 80.0–100.0)
Platelets: 275 10*3/uL (ref 150–400)
RBC: 3.48 MIL/uL — ABNORMAL LOW (ref 3.87–5.11)
RDW: 15 % (ref 11.5–15.5)
WBC: 6.4 10*3/uL (ref 4.0–10.5)
nRBC: 0 % (ref 0.0–0.2)

## 2023-11-06 NOTE — Progress Notes (Signed)
Occupational Therapy Treatment Patient Details Name: Elizabeth Odom MRN: [id number] DOB: 10-03-1941 Today's Date: 11/06/2023   History of present illness Patient is a 82 year old female who presented on 10/17 with acute colitis. on10/21, patient underwent ex laparotomy with hartman's resection and colostomy and small bowel resection. PMH: a fib, RLS, syncope, cervical CA, cervical spine fusions, anemia   OT comments  OT provided instruction on lower body dressing with the pt seated in the chair, for which the pt required mod assist overall. She was further assisted with sit to stand from the chair using a RW; she performed 2 stands & subsequently required assist for balance while demonstrating marching in place. She referenced pain of her buttocks/sacral region, which she attributed to a current sore. As such, OT educated her on how to, as well as the importance of implementing regular pressure relief to help facilitate improved healing. Continue OT plan of care. Patient will benefit from continued inpatient follow up therapy, <3 hours/day.       If plan is discharge home, recommend the following:  A lot of help with bathing/dressing/bathroom;Assistance with cooking/housework;Assist for transportation;A lot of help with walking and/or transfers;Direct supervision/assist for medications management   Equipment Recommendations  None recommended by OT    Recommendations for Other Services      Precautions / Restrictions Precautions Precautions: Fall Precaution Comments:  Restrictions Weight Bearing Restrictions: No Other Position/Activity Restrictions: abdominal surgery      Mobility Bed Mobility    General bed mobility comments: pt was found seated in the bedside chair    Transfers Overall transfer level: Needs assistance Equipment used: Rolling walker (2 wheels) Transfers: Sit to/from Stand Sit to Stand: Mod assist           General transfer comment: The pt was  instructed on sit to stand transfers from the chair using a RW, for which she required mod assist overall. She performed 2 total stands, with a seated rest break needed between stands. She was able to demo marching in place using the RW after each stand, though needed assist for balance/steadying.         ADL either performed or assessed with clinical judgement   ADL Overall ADL's : Needs assistance/impaired Eating/Feeding: Set up;Sitting Eating/Feeding Details (indicate cue type and reason): Not formally observed, however pt reported being able to self-feed seated in the chair                 Lower Body Dressing: Moderate assistance Lower Body Dressing Details (indicate cue type and reason): Reinforcement was provided on implementing the figure 4 technique for lower body dressing, specically donning and doffing socks. She performed teach back, requiring mod assist overall and verbal cues for correct form/technique                     Cognition Arousal: Alert Behavior During Therapy: WFL for tasks assessed/performed Overall Cognitive Status: Within Functional Limits for tasks assessed                        Pertinent Vitals/ Pain       Pain Assessment Pain Assessment: Faces Pain Score: 5  Pain Location: buttocks/abdomen Pain Intervention(s): Patient requesting pain meds-RN notified, Monitored during session, Limited activity within patient's tolerance         Frequency  Min 1X/week        Progress Toward Goals  OT Goals(current goals can now be found in  the care plan section)  Progress towards OT goals: Progressing toward goals  Acute Rehab OT Goals OT Goal Formulation: With patient Time For Goal Achievement: 11/16/23 Potential to Achieve Goals: Good  Plan         AM-PAC OT "6 Clicks" Daily Activity     Outcome Measure   Help from another person eating meals?: A Little Help from another person taking care of personal grooming?: A  Little Help from another person toileting, which includes using toliet, bedpan, or urinal?: A Lot Help from another person bathing (including washing, rinsing, drying)?: A Lot Help from another person to put on and taking off regular upper body clothing?: A Little Help from another person to put on and taking off regular lower body clothing?: A Lot 6 Click Score: 15    End of Session Equipment Utilized During Treatment: Gait belt;Rolling walker (2 wheels)  OT Visit Diagnosis: Muscle weakness (generalized) (M62.81);Pain;Unsteadiness on feet (R26.81) Pain - part of body:  (buttocks and abdomen)   Activity Tolerance Patient limited by pain   Patient Left in chair;with call bell/phone within reach   Nurse Communication Other (comment) (pt declined assistance to [facility] for toileting after indicating she needed to urinate; pt instead inquired about nursing putting back on Purewick)        Time: 2703-5009 OT Time Calculation (min): 22 min  Charges: OT General Charges $OT Visit: 1 Visit OT Treatments $Therapeutic Activity: 8-22 mins    Reuben Likes, OTR/L 11/06/2023, 3:54 PM

## 2023-11-06 NOTE — Plan of Care (Signed)
  Problem: Fluid Volume: Goal: Ability to maintain a balanced intake and output will improve Outcome: Progressing   

## 2023-11-06 NOTE — Progress Notes (Addendum)
36 Days Post-Op   Subjective/Chief Complaint: Resting comfortably.  850 of PO intake documented.   Objective: Vital signs in last 24 hours: Temp:  [97.9 F (36.6 C)-98.1 F (36.7 C)] 97.9 F (36.6 C) (11/26 0538) Pulse Rate:  [74-79] 79 (11/25 2213) Resp:  [18] 18 (11/26 0538) BP: (107-117)/(55-56) 117/56 (11/26 0538) SpO2:  [97 %-99 %] 97 % (11/26 0538) Last BM Date : 11/05/23  Intake/Output from previous day: 11/25 0701 - 11/26 0700 In: 850 [P.O.:850] Out: 200 [Stool:200] Intake/Output this shift: No intake/output data recorded.  Alert, calm Unlabored respirations Abdomen is soft, benign.  Midline wound is granulating and shallow without any unusual drainage.  Ostomy with brown semi-solid output. Interval removal of drain.    Lab Results:  Recent Labs    11/05/23 0315 11/06/23 0350  WBC 7.4 6.4  HGB 11.4* 10.7*  HCT 34.2* 33.7*  PLT 247 275    BMET Recent Labs    11/05/23 0315 11/06/23 0350  NA 132* 132*  K 3.6 3.6  CL 97* 98  CO2 25 25  GLUCOSE 124* 120*  BUN 27* 33*  CREATININE 0.69 0.87  CALCIUM 9.1 9.1   PT/INR No results for input(s): "LABPROT", "INR" in the last 72 hours. ABG No results for input(s): "PHART", "HCO3" in the last 72 hours.  Invalid input(s): "PCO2", "PO2"  Studies/Results: IR Sinus/Fist Tube Chk-Non GI  Result Date: 11/05/2023 INDICATION: 82 year old with postoperative pelvic abscess. Previous drain injection demonstrated a bowel fistula. Minimal output from the drain and evaluate for a bowel fistula. EXAM: SINUS TRACT INJECTION / FISTULOGRAM COMPARISON:  None Available. MEDICATIONS: None ANESTHESIA/SEDATION: None COMPLICATIONS: None immediate. FLUOROSCOPY: Radiation Exposure Index (as provided by the fluoroscopic device): 31 mGy Kerma TECHNIQUE: Patient was placed in left lateral decubitus position on the interventional table. Scout image was obtained. PROCEDURE: Drain was injected with 6 mL Omnipaque 300. Following the  injection, the drain was cut and removed without complication. Bandage placed at the old drain site. FINDINGS: Pigtail drain was in the posterior aspect of the pelvis. Minimal collection around the drain itself. Majority of the contrast was draining around the tube towards the skin. No evidence for a bowel fistula. IMPRESSION: 1. No significant collection around the drain. No evidence for a bowel fistula. 2. Drain was removed. Electronically Signed   By: Richarda Overlie M.D.   On: 11/05/2023 10:35    Anti-infectives: Anti-infectives (From admission, onward)    Start     Dose/Rate Route Frequency Ordered Stop   11/05/23 1830  fluconazole (DIFLUCAN) IVPB 200 mg        200 mg 100 mL/hr over 60 Minutes Intravenous  Once 11/05/23 1723 11/05/23 2227   11/05/23 1815  fluconazole (DIFLUCAN) tablet 200 mg  Status:  Discontinued        200 mg Oral Daily 11/05/23 1723 11/05/23 1725   10/15/23 1800  vancomycin (VANCOCIN) IVPB 1000 mg/200 mL premix  Status:  Discontinued        1,000 mg 200 mL/hr over 60 Minutes Intravenous Every 24 hours 10/14/23 1740 10/15/23 1045   10/15/23 1430  piperacillin-tazobactam (ZOSYN) IVPB 3.375 g        3.375 g 100 mL/hr over 30 Minutes Intravenous  Once 10/15/23 1343 10/15/23 1503   10/15/23 1400  DAPTOmycin (CUBICIN) IVPB 500 mg/70mL premix  Status:  Discontinued        8 mg/kg  64.1 kg 100 mL/hr over 30 Minutes Intravenous Daily 10/15/23 1045 10/22/23 1321   10/14/23 1800  vancomycin (VANCOREADY) IVPB 1250 mg/250 mL        1,250 mg 166.7 mL/hr over 90 Minutes Intravenous  Once 10/14/23 1740 10/14/23 2255   10/14/23 1400  piperacillin-tazobactam (ZOSYN) IVPB 3.375 g  Status:  Discontinued        3.375 g 12.5 mL/hr over 240 Minutes Intravenous Every 8 hours 10/14/23 0845 10/22/23 1321   10/14/23 0900  piperacillin-tazobactam (ZOSYN) IVPB 3.375 g        3.375 g 100 mL/hr over 30 Minutes Intravenous NOW 10/14/23 0844 10/14/23 1002   10/05/23 1200  piperacillin-tazobactam  (ZOSYN) IVPB 3.375 g        3.375 g 12.5 mL/hr over 240 Minutes Intravenous Every 8 hours 10/05/23 0721 10/09/23 1716   10/04/23 2000  piperacillin-tazobactam (ZOSYN) IVPB 3.375 g  Status:  Discontinued        3.375 g 12.5 mL/hr over 240 Minutes Intravenous Every 8 hours 10/04/23 1827 10/05/23 0721   09/27/23 2315  piperacillin-tazobactam (ZOSYN) IVPB 3.375 g  Status:  Discontinued        3.375 g 12.5 mL/hr over 240 Minutes Intravenous Every 8 hours 09/27/23 2305 10/02/23 1106       Assessment/Plan: POD 36 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 [name] - CT 10/24 with hematoma in the pelvis adjacent to small bowel anastomosis and ileus vs obstruction  - IR 11/1: drain placement for small bowel anastomotic leak/ abscess drainage - CT 11/8 with resolution in previous right hemipelvic collection and no leakage of enteric contrast - drain injection study 11/25 shows resolution of fistula and drain was removed. - reg diet, supplements; appreciate ongoing care from dietitian.  P.o. intake has been okay (700) - not enough bu better than previous. Fistula has resolved so hopefully can increase PO intake and avoid resumption of  TPN. - continue daily dressing changes  - Needs mobilization and physical conditioning -Wean off IV medication/narcotics.    FEN: reg diet -appreciate ongoing care from dietitian, we will see how her appetite is off the TPN Foley - removed. Has some urinary incontinence.  VTE: IVC Filter  ID: no current abx; topical antifungal to yeast dermatitis in groin area  Okay for transition to SNF when bed available   ABL anemia secondary to post-op hematomas and hematuria  Acute PE and LLE DVT Pancreatic tail cyst Thyroid nodule  Chronic HFpEF Paroxysmal A. Fib Pre-diabetes RLS    Adam Phenix 11/06/2023

## 2023-11-06 NOTE — Progress Notes (Signed)
PROGRESS NOTE    Elizabeth Odom  VOZ:[id number] DOB: 04/23/1941 DOA: 09/27/2023 PCP: [name], FNP    Chief Complaint  Patient presents with   Abdominal Pain    Brief Narrative:  82 year old with past medical history significant for A-fib, RLS, anemia presented to the ED 10/17 with recurrent abdominal pain, nausea emesis, diarrhea intolerance of oral intake.  She was evaluated in the ED 10/10 for abdominal pain, decreased appetite, fever, diagnosed with possible colitis noted on CT abdomen and pelvis discharged on Augmentin.  She had recurrence of symptoms and presented back to the hospital.  CT initially showed proximal sigmoid colon wall thickening with upstream colonic dilation with air-fluid levels and large amount of stool compatible with obstruction.  9 mm cystic lesion in the tail of the pancreas and subcentimeter lucent lesion in L1 vertebral body.  GI consulted, patient refused colonoscopy but agreed to sigmoidoscopy showing stricture versus mass at the rectosigmoid region.  Scope could not be advanced past this.  Surgery was consulted and she underwent a small bowel resection and Hartman's resection and  colostomy on 10/01/2023.  She continued to have postoperative ileus managed by general surgery.  NG remains in place to suction.   Hospital course also complicated by acute PE and left lower extremity DVT she was started on heparin.  She subsequently developed acute blood loss anemia and subsequent pelvic hematoma near the small bowel anastomosis as well as notable hematuria.  Urology was consulted.  Signed off after hematuria resolution.  Given high risk for bleeding with notable PE DVT evaluated by IR and placed IVC filter 10/05/2023.  Patient required total of 4 units of packed red blood cell.   As her bowel function did not return she underwent a repeat CT abdomen and pelvis on 10/31st showing anastomotic leak into 7 x 6 cm complex fluid collection posteriorly in the pelvis.   IR was consulted and underwent CT-guided percutaneous drain placement into pelvic abscess.  Fluid grew MRSA.  ID consulted and once transitioned to daptomycin and Zosyn.   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  ID recommended no further need of antibiotics.    Needs to be wean off TPN to be able to go to SNF   Assessment & Plan:   Principal Problem:   Large bowel obstruction (HCC) Active Problems:   Acute colitis   Left leg DVT (HCC)   ABLA (acute blood loss anemia)   Palliative care encounter   Counseling and coordination of care   Need for emotional support   Goals of care, counseling/discussion   High risk medication use   Pain   Medication management   Pressure injury of skin   Malnutrition of moderate degree   Iron deficiency anemia due to chronic blood loss   Acute pulmonary embolism (HCC)   Hyponatremia   Acute urinary retention   #1 large bowel obstruction status post Hartman's resection, colostomy complicated by anastomosis leak of pelvic abscess requiring drain placement/fistula connection with colon staple line anastomosis/postop ileus versus partial small bowel obstruction -Patient noted to have presented abdominal pain with CT abdomen and pelvis done 09/27/2023 with proximal sigmoid colon thickening. -Patient seen in consultation by GI underwent sigmoidoscopy 09/29/2023 which showed a tight stricture found to have a mass, biopsy was negative for malignancy suggesting inflammation. -General Surgery consulted status post exploratory laparotomy Hartman's resection 10/01/2023 with small bowel resection and ostomy per Dr. [name]. -Repeat  CT abdomen and pelvis 10/11/2023 with small leak at the anastomosis going into 6.7 cm fluid collection. -Patient seen in consultation by IR underwent drain catheter placement of pelvic abscess on 10/12/2023 with cultures growing MRSA. -Patient seen in consultation  by ID and treated with daptomycin and Zosyn. -Repeat CT abdomen and pelvis 10/18/2023 with percutaneous peak tail drain catheter with no persistent anastomotic leak or fistula. -10/22/2023 pelvic drain injection study showed fistulous communication with colon/staple line anastomosis with drain to remain in place. -Per ID no further indication for antibiotics and antibiotics subsequently discontinued. -Was on TPN which has been weaned off per general surgery recommendations. -Diet advanced to regular diet which patient seems to be tolerating. -Continue daily dressings. -Mobilization. -Patient assessed by IR and patient underwent pelvic drain injection study and drain removal on 11/05/2023.   -Per general surgery.   2.  Hyponatremia -Was on D10 which was subsequently discontinued with improvement with hyponatremia. -Sodium fluctuating however seems to have stabilized currently at 132. -Follow.  3.  Acute blood loss anemia status post perianastomotic hematoma with hematuria -Status post transfusion 4 units PRBCs during the hospitalization. -Status post IVC filter. -Seen in consultation by urology no indication for cystoscopy or intervention at this time. -Hemoglobin stable at 10.7. -Follow H&H.  4.  Acute PE (small to moderate clot burden) without right heart strain/acute left lower extremity DVT -Patient noted unable to tolerate anticoagulation status post IVC filter placement 10/05/2023. -2D echo with no evidence of right heart strain. -CT scan with no evidence of right heart strain. -Outpatient follow-up with PCP.  5.  Thyroid nodule in the inferior aspect of the isthmus -Outpatient follow-up.  6.  Paroxysmal atrial fibrillation -Rate controlled.   -Not on anticoagulation secondary to problem #3.  7.  Hypokalemia/hypophosphatemia/hypomagnesemia -Patient was on TPN which has been weaned off. -Potassium currently at 3.6, phosphorus at 3.4, magnesium at 2.2.  -Repeat labs in AM.    8.  Urinary retention -Status post Foley catheter placement which has been discontinued..   -Monitor urine output.    9.  Hypotension -Resolved. -Follow.  10.  Leukocytosis -Resolved.  11.  Moderate protein calorie malnutrition -TPN weaned off.  Diet advanced.   12.  Pressure injury right Botox stage II/MASD Pressure Injury 10/08/23 Buttocks Right;Mid Stage 2 -  Partial thickness loss of dermis presenting as a shallow open injury with a red, pink wound bed without slough. (Active)  10/08/23 0800  Location: Buttocks  Location Orientation: Right;Mid  Staging: Stage 2 -  Partial thickness loss of dermis presenting as a shallow open injury with a red, pink wound bed without slough.  Wound Description (Comments):   Present on Admission:    #13 yeast dermatitis -In groin area and buttocks. -Status post IV Diflucan x 1 -Continue clotrimazole antifungal cream. -Pain management.    DVT prophylaxis: SCDs Code Status: DNR Family Communication: Updated patient.  No family at bedside. Disposition: SNF once bed available.  Per general surgery patient okay for transfer to SNF when bed available.   Status is: Inpatient Remains inpatient appropriate because: Severity of illness   Consultants:  Gastroenterology: Dr. Lorenso Quarry 09/28/2023 General Surgery: Dr. [name] 09/29/2023 Wound care consult PCCM: Dr. Jamison Neighbor 10/04/2023 Urology: Dr. Cardell Peach 10/05/2023 Interventional radiology: Dr. Deanne Coffer 10/05/2023 Palliative care: Dr. Patterson Hammersmith 10/05/2023 Interventional radiology: Dr.Mugweru 10/12/2023 ID: Dr. Thedore Mins 10/13/2023   Procedures:  Flexible sigmoidoscopy per Dr. Marca Ancona GI 09/29/2023 IVC gram and IV filter placement per IR: Dr. Deanne Coffer 10/05/2023 Drainage catheter placement into pelvic abscess  per IR: Dr.Mugweru 10/12/2023 CT abdomen and pelvis 09/27/2023, 10/11/2023, 10/19/2023 CT chest 09/30/2023 MRI abdomen 09/28/2023 Drain injection/fistulogram/abscessogram per IR 10/22/2023 2D echo  10/01/2023 Lower extremity Dopplers 10/04/2023 CT abdomen and pelvis without contrast 10/04/2023 Sigmoid colectomy with colostomy creation/Hartman's procedure/small bowel resection with anastomosis: Per general surgery: Dr. [name] 10/01/2023 Transfusion 4 units PRBCs During injection and removal per IR: Dr. Lowella Dandy 11/05/2023  Antimicrobials:  Anti-infectives (From admission, onward)    Start     Dose/Rate Route Frequency Ordered Stop   11/05/23 1830  fluconazole (DIFLUCAN) IVPB 200 mg        200 mg 100 mL/hr over 60 Minutes Intravenous  Once 11/05/23 1723 11/05/23 2227   11/05/23 1815  fluconazole (DIFLUCAN) tablet 200 mg  Status:  Discontinued        200 mg Oral Daily 11/05/23 1723 11/05/23 1725   10/15/23 1800  vancomycin (VANCOCIN) IVPB 1000 mg/200 mL premix  Status:  Discontinued        1,000 mg 200 mL/hr over 60 Minutes Intravenous Every 24 hours 10/14/23 1740 10/15/23 1045   10/15/23 1430  piperacillin-tazobactam (ZOSYN) IVPB 3.375 g        3.375 g 100 mL/hr over 30 Minutes Intravenous  Once 10/15/23 1343 10/15/23 1503   10/15/23 1400  DAPTOmycin (CUBICIN) IVPB 500 mg/37mL premix  Status:  Discontinued        8 mg/kg  64.1 kg 100 mL/hr over 30 Minutes Intravenous Daily 10/15/23 1045 10/22/23 1321   10/14/23 1800  vancomycin (VANCOREADY) IVPB 1250 mg/250 mL        1,250 mg 166.7 mL/hr over 90 Minutes Intravenous  Once 10/14/23 1740 10/14/23 2255   10/14/23 1400  piperacillin-tazobactam (ZOSYN) IVPB 3.375 g  Status:  Discontinued        3.375 g 12.5 mL/hr over 240 Minutes Intravenous Every 8 hours 10/14/23 0845 10/22/23 1321   10/14/23 0900  piperacillin-tazobactam (ZOSYN) IVPB 3.375 g        3.375 g 100 mL/hr over 30 Minutes Intravenous NOW 10/14/23 0844 10/14/23 1002   10/05/23 1200  piperacillin-tazobactam (ZOSYN) IVPB 3.375 g        3.375 g 12.5 mL/hr over 240 Minutes Intravenous Every 8 hours 10/05/23 0721 10/09/23 1716   10/04/23 2000  piperacillin-tazobactam (ZOSYN)  IVPB 3.375 g  Status:  Discontinued        3.375 g 12.5 mL/hr over 240 Minutes Intravenous Every 8 hours 10/04/23 1827 10/05/23 0721   09/27/23 2315  piperacillin-tazobactam (ZOSYN) IVPB 3.375 g  Status:  Discontinued        3.375 g 12.5 mL/hr over 240 Minutes Intravenous Every 8 hours 09/27/23 2305 10/02/23 1106         Subjective: Sitting up in recliner.  Complaining of buttocks pain.  Denies any chest pain or shortness of breath.  No significant change in abdominal pain although slowly improving.  Patient concerned about going to SNF, anxious about SNF placement and would like her family to discuss with TOC prior to deciding on SNF placement.    Objective: Vitals:   11/05/23 1341 11/05/23 2213 11/06/23 0538 11/06/23 1334  BP: (!) 107/56 (!) 116/55 (!) 117/56 117/66  Pulse: 74 79  84  Resp: 18 18 18 16   Temp: 98.1 F (36.7 C) 98.1 F (36.7 C) 97.9 F (36.6 C)   TempSrc: Oral Oral    SpO2: 99% 98% 97% 99%  Weight:      Height:        Intake/Output Summary (Last  24 hours) at 11/06/2023 1342 Last data filed at 11/06/2023 0600 Gross per 24 hour  Intake 850 ml  Output 200 ml  Net 650 ml   Filed Weights   10/25/23 0713 10/28/23 0753 10/31/23 0500  Weight: 62.2 kg 58.2 kg 59.9 kg    Examination:  General exam: NAD Respiratory system: Lungs clear to auscultation bilaterally anterior lung fields.  No wheezes, no crackles, no rhonchi.  Fair air movement.  Speaking in full sentences.   Cardiovascular system: RRR no murmurs rubs or gallops.  No JVD.  No lower extremity edema.  Gastrointestinal system: Abdomen is soft, nondistended, some diffuse tenderness to palpation.  Mid abdominal wound with dressing.  Ostomy with brown stool.  Positive bowel sounds.  Central nervous system: Alert and oriented. No focal neurological deficits. Extremities: Symmetric 5 x 5 power. Skin: No rashes, lesions or ulcers Psychiatry: Judgement and insight appear normal. Mood & affect appropriate.      Data Reviewed: I have personally reviewed following labs and imaging studies  CBC: Recent Labs  Lab 11/01/23 0342 11/02/23 0425 11/03/23 0306 11/04/23 0320 11/05/23 0315 11/06/23 0350  WBC 6.5 6.9 8.0 8.3 7.4 6.4  NEUTROABS 3.6  --   --   --   --   --   HGB 10.5* 11.0* 11.0* 10.7* 11.4* 10.7*  HCT 32.3* 34.5* 34.5* 33.3* 34.2* 33.7*  MCV 96.4 97.7 97.2 96.0 95.0 96.8  PLT 170 173 192 199 247 275    Basic Metabolic Panel: Recent Labs  Lab 11/01/23 0342 11/02/23 0425 11/03/23 0306 11/04/23 0320 11/05/23 0315 11/06/23 0350  NA 133* 134* 128* 128* 132* 132*  K 3.7 4.0 3.9 3.5 3.6 3.6  CL 102 103 97* 97* 97* 98  CO2 23 24 25 25 25 25   GLUCOSE 116* 100* 109* 109* 124* 120*  BUN 27* 30* 29* 23 27* 33*  CREATININE 0.56 0.52 0.70 0.62 0.69 0.87  CALCIUM 8.9 9.1 9.1 8.9 9.1 9.1  MG 1.8 1.8 1.9 1.7 2.2  --   PHOS 3.7 4.0 4.3  --  3.4  --     GFR: Estimated Creatinine Clearance: 44.1 mL/min (by C-G formula based on SCr of 0.87 mg/dL).  Liver Function Tests: Recent Labs  Lab 11/01/23 0342 11/02/23 0425 11/03/23 0306  AST 30 35 38  ALT 47* 51* 59*  ALKPHOS 128* 124 136*  BILITOT 0.6 0.9 1.2*  PROT 7.7 7.6 7.9  ALBUMIN 2.4* 2.4* 2.5*    CBG: No results for input(s): "GLUCAP" in the last 168 hours.   No results found for this or any previous visit (from the past 240 hour(s)).       Radiology Studies: IR Sinus/Fist Tube Chk-Non GI  Result Date: 11/05/2023 INDICATION: 82 year old with postoperative pelvic abscess. Previous drain injection demonstrated a bowel fistula. Minimal output from the drain and evaluate for a bowel fistula. EXAM: SINUS TRACT INJECTION / FISTULOGRAM COMPARISON:  None Available. MEDICATIONS: None ANESTHESIA/SEDATION: None COMPLICATIONS: None immediate. FLUOROSCOPY: Radiation Exposure Index (as provided by the fluoroscopic device): 31 mGy Kerma TECHNIQUE: Patient was placed in left lateral decubitus position on the interventional  table. Scout image was obtained. PROCEDURE: Drain was injected with 6 mL Omnipaque 300. Following the injection, the drain was cut and removed without complication. Bandage placed at the old drain site. FINDINGS: Pigtail drain was in the posterior aspect of the pelvis. Minimal collection around the drain itself. Majority of the contrast was draining around the tube towards the skin. No evidence for a  bowel fistula. IMPRESSION: 1. No significant collection around the drain. No evidence for a bowel fistula. 2. Drain was removed. Electronically Signed   By: Richarda Overlie M.D.   On: 11/05/2023 10:35        Scheduled Meds:  Chlorhexidine Gluconate Cloth  6 each Topical Daily   clotrimazole   Topical BID   docusate sodium  100 mg Oral BID   feeding supplement  1 Container Oral BID BM   Gerhardt's butt cream   Topical TID   ketotifen  2 drop Both Eyes BID   multivitamin with minerals  1 tablet Oral Daily   polyethylene glycol  17 g Oral Daily   sodium chloride flush  10-40 mL Intracatheter Q12H   Continuous Infusions:     LOS: 40 days    Time spent: 35 minutes    Ramiro Harvest, MD [facility]   To contact the attending provider between 7A-7P or the covering provider during after hours 7P-7A, please log into the web site [email or web address] and access using universal [place] password for that web site. If you do not have the password, please call the hospital operator.  11/06/2023, 1:42 PM

## 2023-11-06 NOTE — Progress Notes (Signed)
Physical Therapy Treatment Patient Details Name: Elizabeth Odom MRN: [id number] DOB: 1941-05-04 Today's Date: 11/06/2023   History of Present Illness Patient is a 82 year old female who presented on 10/17 with acute colitis. on10/21, patient underwent ex laparotomy with hartman's resection and colostomy and small bowel resection. PMH: a fib, RLS, syncope, cervical CA, cervical spine fusions, anemia    PT Comments  Pt up in chair on arrival, agreeable to PT. Pt able to amb short hallway distance with mod assist and second person for safety/chair follow. Pt fatigues rapidly; encouraged OOB/BSC to incr activity tolerance. D/c plan remains appropriate.    If plan is discharge home, recommend the following: A lot of help with walking and/or transfers;A lot of help with bathing/dressing/bathroom;Assistance with cooking/housework;Help with stairs or ramp for entrance;Two people to help with walking and/or transfers   Can travel by private vehicle     No  Equipment Recommendations  None recommended by PT    Recommendations for Other Services       Precautions / Restrictions Precautions Precautions: Fall Precaution Comments: colostomy, sacral wound, abd  surgical wound Restrictions Weight Bearing Restrictions: No Other Position/Activity Restrictions: abdominal surgery, colostomy     Mobility  Bed Mobility               General bed mobility comments: in recliner on arrival, repositioned pt in chair and did not return pt to bed d/t air mattress not functioning    Transfers Overall transfer level: Needs assistance Equipment used: Rolling walker (2 wheels) Transfers: Sit to/from Stand Sit to Stand: Mod assist, Min assist, +2 physical assistance, +2 safety/equipment           General transfer comment: STS x2 from recliner. assist to power up and steady with cues for hand placement and wt shift    Ambulation/Gait Ambulation/Gait assistance: Min assist, Mod assist, +2  safety/equipment Gait Distance (Feet): 30 Feet Assistive device: Rolling walker (2 wheels) Gait Pattern/deviations: Step-through pattern, Decreased stride length, Trunk flexed, Narrow base of support, Knee flexed in stance - right, Knee flexed in stance - left, Decreased dorsiflexion - right, Decreased dorsiflexion - left Gait velocity: decreased     General Gait Details: cues for trunk extension, incr BOS, incr step length. min to mod assist to maneuver RW and balance  throughout. fatigues quickly   Stairs             Wheelchair Mobility     Tilt Bed    Modified Rankin (Stroke Patients Only)       Balance                                            Cognition Arousal: Alert Behavior During Therapy: WFL for tasks assessed/performed Overall Cognitive Status: Within Functional Limits for tasks assessed                                 General Comments: patient needed increased encouragement to engage in session on this date.        Exercises General Exercises - Lower Extremity Ankle Circles/Pumps: AROM, 15 reps, Both Gluteal Sets: AROM, Both, 10 reps    General Comments        Pertinent Vitals/Pain Pain Assessment Pain Assessment: Faces Faces Pain Scale: Hurts little more Pain Location: buttocks/abdomen Pain Descriptors /  Indicators: Constant, Sore Pain Intervention(s): Limited activity within patient's tolerance, Monitored during session, Repositioned    Home Living                          Prior Function            PT Goals (current goals can now be found in the care plan section) Acute Rehab PT Goals 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[employer] PT Goal Formulation: With patient Time For Goal Achievement: 11/14/23 Potential to Achieve Goals: Good Progress towards PT goals: Progressing toward goals    Frequency    Min 1X/week      PT Plan      Co-evaluation               AM-PAC PT "6 Clicks" Mobility   Outcome Measure  Help needed turning from your back to your side while in a flat bed without using bedrails?: A Lot Help needed moving from lying on your back to sitting on the side of a flat bed without using bedrails?: A Lot Help needed moving to and from a bed to a chair (including a wheelchair)?: A Lot Help needed standing up from a chair using your arms (e.g., wheelchair or bedside chair)?: A Lot Help needed to walk in hospital room?: A Lot Help needed climbing 3-5 steps with a railing? : Total 6 Click Score: 11    End of Session Equipment Utilized During Treatment: Gait belt Activity Tolerance: Patient limited by fatigue;Patient limited by pain Patient left: in chair;with call bell/phone within reach (no alarm on arrival) Nurse Communication: Mobility status PT Visit Diagnosis: Difficulty in walking, not elsewhere classified (R26.2);Muscle weakness (generalized) (M62.81);Unsteadiness on feet (R26.81);Pain Pain - part of body:  (abdomen and sacrum)     Time: 2841-3244 PT Time Calculation (min) (ACUTE ONLY): 14 min  Charges:    $Gait Training: 8-22 mins PT General Charges $$ ACUTE PT VISIT: 1 Visit                     Markevius Trombetta, PT  Acute Rehab Dept [facility]) [phone]  11/06/2023    [facility] 11/06/2023, 12:52 PM

## 2023-11-07 DIAGNOSIS — K56609 Unspecified intestinal obstruction, unspecified as to partial versus complete obstruction: Secondary | ICD-10-CM | POA: Diagnosis not present

## 2023-11-07 LAB — RENAL FUNCTION PANEL
Albumin: 2.4 g/dL — ABNORMAL LOW (ref 3.5–5.0)
Anion gap: 8 (ref 5–15)
BUN: 25 mg/dL — ABNORMAL HIGH (ref 8–23)
CO2: 26 mmol/L (ref 22–32)
Calcium: 9.2 mg/dL (ref 8.9–10.3)
Chloride: 101 mmol/L (ref 98–111)
Creatinine, Ser: 0.85 mg/dL (ref 0.44–1.00)
GFR, Estimated: >60 mL/min (ref >60)
Glucose, Bld: 113 mg/dL — ABNORMAL HIGH (ref 70–99)
Phosphorus: 3.2 mg/dL (ref 2.5–4.6)
Potassium: 3.6 mmol/L (ref 3.5–5.1)
Sodium: 135 mmol/L (ref 135–145)

## 2023-11-07 LAB — CBC
HCT: 32 % — ABNORMAL LOW (ref 36.0–46.0)
Hemoglobin: 10.1 g/dL — ABNORMAL LOW (ref 12.0–15.0)
MCH: 30.6 pg (ref 26.0–34.0)
MCHC: 31.6 g/dL (ref 30.0–36.0)
MCV: 97 fL (ref 80.0–100.0)
Platelets: 264 10*3/uL (ref 150–400)
RBC: 3.3 MIL/uL — ABNORMAL LOW (ref 3.87–5.11)
RDW: 14.6 % (ref 11.5–15.5)
WBC: 5.9 10*3/uL (ref 4.0–10.5)
nRBC: 0 % (ref 0.0–0.2)

## 2023-11-07 LAB — MAGNESIUM: Magnesium: 1.8 mg/dL (ref 1.7–2.4)

## 2023-11-07 MED ORDER — OXYCODONE HCL 5 MG PO TABS
5.0000 mg | ORAL_TABLET | Freq: Four times a day (QID) | ORAL | Status: DC | PRN
  Administered 2023-11-08 (×2): 5 mg via ORAL
  Filled 2023-11-07 (×2): qty 1

## 2023-11-07 MED ORDER — ACETAMINOPHEN 500 MG PO TABS
1000.0000 mg | ORAL_TABLET | Freq: Four times a day (QID) | ORAL | Status: DC
  Administered 2023-11-07 – 2023-11-09 (×6): 1000 mg via ORAL
  Filled 2023-11-07 (×7): qty 2

## 2023-11-07 MED ORDER — OXYCODONE HCL 5 MG PO TABS
10.0000 mg | ORAL_TABLET | Freq: Four times a day (QID) | ORAL | Status: DC | PRN
  Administered 2023-11-09: 10 mg via ORAL
  Filled 2023-11-07: qty 2

## 2023-11-07 MED ORDER — POLYETHYLENE GLYCOL 3350 17 G PO PACK: 17.0000 g | PACK | Freq: Every day | ORAL | Status: DC | PRN

## 2023-11-07 NOTE — TOC Progression Note (Signed)
Transition of Care [facility]) - Progression Note    Patient Details  Name: Elizabeth Odom MRN: [id number] Date of Birth: [birth year]  Transition of Care [facility] CM/SW Contact  Darleene Cleaver, [place] Phone Number: 11/07/2023, 12:51 PM  Clinical Narrative:     Updated FL2, and resent updated therapy notes.  Per bedside nurse, patient is no longer on TPN.    Expected Discharge Plan: Skilled Nursing Facility Barriers to Discharge: Continued Medical Work up, English as a second language teacher, SNF Pending bed offer  Expected Discharge Plan and Services In-house Referral: NA Discharge Planning Services: CM Consult Post Acute Care Choice: Home Health Living arrangements for the past 2 months: Apartment                 DME Arranged: N/A DME Agency: NA       HH Arranged: PT, RN HH Agency: [facility] Date HH Agency Contacted: 10/02/23 Time HH Agency Contacted: 1550 Representative spoke with at [facility] Agency: Morrie Sheldon   Social Determinants of Health (SDOH) Interventions SDOH Screenings   Food Insecurity: No Food Insecurity (09/28/2023)  Housing: Low Risk  (09/28/2023)  Transportation Needs: No Transportation Needs (09/28/2023)  Utilities: Not At Risk (09/28/2023)  Tobacco Use: Low Risk  (10/04/2023)    Readmission Risk Interventions    10/19/2023    6:13 PM  Readmission Risk Prevention Plan  Transportation Screening Complete  PCP or Specialist Appt within 5-7 Days Complete  Home Care Screening Complete  Medication Review (RN CM) Complete

## 2023-11-07 NOTE — Progress Notes (Signed)
Mobility Specialist - Progress Note   11/07/23 1007  Mobility  Activity Transferred from bed to chair  Level of Assistance Contact guard assist, steadying assist  Assistive Device Front wheel walker  Distance Ambulated (ft) 2 ft  Activity Response Tolerated well  Mobility Referral Yes  $Mobility charge 1 Mobility  Mobility Specialist Start Time (ACUTE ONLY) [id number]  Mobility Specialist Stop Time (ACUTE ONLY) 0953  Mobility Specialist Time Calculation (min) (ACUTE ONLY) 24 min   Pt received in bed and agreeable to mobility. Pt was minA from supiner >sitting& modA from STS. Once standing, pt c/o feeling dizzy. Halted further ambulation and assisted pt to recliner. Pt was CG during transfer. No other complaints during session. Pt to recliner after session with all needs met. RN in room.   [facility]

## 2023-11-07 NOTE — Evaluation (Signed)
Occupational Therapy Evaluation Patient Details Name: Elizabeth Odom MRN: [id number] DOB: 08-Feb-1941 Today's Date: 11/07/2023   History of Present Illness Patient is a 82 year old female who presented on 10/17 with acute colitis. on10/21, patient underwent ex laparotomy with hartman's resection and colostomy and small bowel resection. PMH: a fib, RLS, syncope, cervical CA, cervical spine fusions, anemia   Clinical Impression   Pt tearful during session, reports being tired of pain. Participated in seated grooming and sit to stand for pressure relief and repositioning. Moderate assistance to rise and steady with RW. Pt reports struggling to find a position of comfort with abdominal and buttocks pain. Listened to patient's concerns and communicated those with MD. Patient will benefit from continued inpatient follow up therapy, <3 hours/day.       If plan is discharge home, recommend the following: A lot of help with bathing/dressing/bathroom;Assistance with cooking/housework;Assist for transportation;A lot of help with walking and/or transfers;Direct supervision/assist for medications management    Functional Status Assessment     Equipment Recommendations  Other (comment) (defer)    Recommendations for Other Services       Precautions / Restrictions Precautions Precautions: Fall Precaution Comments: colostomy, sacral wound, abd  surgical wound Restrictions Weight Bearing Restrictions: No      Mobility Bed Mobility               General bed mobility comments: in chair    Transfers Overall transfer level: Needs assistance Equipment used: Rolling walker (2 wheels) Transfers: Sit to/from Stand Sit to Stand: Mod assist           General transfer comment: assist to rise and steady, pt with pain in buttocks when leaning forward in chair      Balance Overall balance assessment: Needs assistance   Sitting balance-Leahy Scale: Poor Sitting balance - Comments: left  leaning, attempting to offload buttocks   Standing balance support: Bilateral upper extremity supported, Reliant on assistive device for balance, During functional activity Standing balance-Leahy Scale: Poor                             ADL either performed or assessed with clinical judgement   ADL Overall ADL's : Needs assistance/impaired     Grooming: Wash/dry hands;Oral care;Brushing hair;Wash/dry face;Sitting;Set up                                       Vision         Perception         Praxis         Pertinent Vitals/Pain Pain Assessment Pain Assessment: Faces Faces Pain Scale: Hurts even more Pain Location: buttocks/abdomen Pain Descriptors / Indicators: Constant, Sore Pain Intervention(s): Repositioned, Monitored during session, Premedicated before session     Extremity/Trunk Assessment             Communication     Cognition Arousal: Alert Behavior During Therapy: Flat affect (tearful) Overall Cognitive Status: Within Functional Limits for tasks assessed                                 General Comments: pt describes herself as being "tired" of pain and cannot imagine participating in therapy at [facility]     General Comments       Exercises  Shoulder Instructions      Home Living                                          Prior Functioning/Environment                          OT Problem List:        OT Treatment/Interventions:      OT Goals(Current goals can be found in the care plan section) Acute Rehab OT Goals OT Goal Formulation: With patient Time For Goal Achievement: 11/16/23 Potential to Achieve Goals: Good  OT Frequency: Min 1X/week    Co-evaluation              AM-PAC OT "6 Clicks" Daily Activity     Outcome Measure Help from another person eating meals?: A Little Help from another person taking care of personal grooming?: A Little Help from another  person toileting, which includes using toliet, bedpan, or urinal?: A Lot Help from another person bathing (including washing, rinsing, drying)?: A Lot Help from another person to put on and taking off regular upper body clothing?: A Little Help from another person to put on and taking off regular lower body clothing?: A Lot 6 Click Score: 15   End of Session Equipment Utilized During Treatment: Rolling walker (2 wheels);Gait belt Nurse Communication: Mobility status  Activity Tolerance: Patient limited by pain Patient left: in chair;with call bell/phone within reach;with chair alarm set  OT Visit Diagnosis: Muscle weakness (generalized) (M62.81);Pain;Unsteadiness on feet (R26.81)                Time: 1430-1500 OT Time Calculation (min): 30 min Charges:  OT General Charges $OT Visit: 1 Visit OT Treatments $Self Care/Home Management : 23-37 mins  Berna Spare, OTR/L Acute Rehabilitation Services Office: [phone]   Evern Bio 11/07/2023, 3:10 PM

## 2023-11-07 NOTE — Progress Notes (Signed)
37 Days Post-Op   Subjective/Chief Complaint: Resting comfortably. Doesn't have a great appetite   Objective: Vital signs in last 24 hours: Temp:  [97.8 F (36.6 C)-98.1 F (36.7 C)] 98.1 F (36.7 C) (11/27 0607) Pulse Rate:  [47-84] 47 (11/27 0607) Resp:  [15-17] 15 (11/27 0607) BP: (116-125)/(63-71) 116/63 (11/27 0607) SpO2:  [96 %-100 %] 96 % (11/27 0607) Weight:  [58.2 kg] 58.2 kg (11/27 0631) Last BM Date : 11/06/23  Intake/Output from previous day: 11/26 0701 - 11/27 0700 In: 860 [P.O.:860] Out: 150 [Stool:150] Intake/Output this shift: No intake/output data recorded.  Alert, calm Unlabored respirations Abdomen is soft, benign.  Midline wound is granulating and shallow without any unusual drainage.  Ostomy with brown semi-solid output. Interval removal of drain.    Lab Results:  Recent Labs    11/06/23 0350 11/07/23 0410  WBC 6.4 5.9  HGB 10.7* 10.1*  HCT 33.7* 32.0*  PLT 275 264    BMET Recent Labs    11/06/23 0350 11/07/23 0410  NA 132* 135  K 3.6 3.6  CL 98 101  CO2 25 26  GLUCOSE 120* 113*  BUN 33* 25*  CREATININE 0.87 0.85  CALCIUM 9.1 9.2   PT/INR No results for input(s): "LABPROT", "INR" in the last 72 hours. ABG No results for input(s): "PHART", "HCO3" in the last 72 hours.  Invalid input(s): "PCO2", "PO2"  Studies/Results: No results found.  Anti-infectives: Anti-infectives (From admission, onward)    Start     Dose/Rate Route Frequency Ordered Stop   11/05/23 1830  fluconazole (DIFLUCAN) IVPB 200 mg        200 mg 100 mL/hr over 60 Minutes Intravenous  Once 11/05/23 1723 11/05/23 2227   11/05/23 1815  fluconazole (DIFLUCAN) tablet 200 mg  Status:  Discontinued        200 mg Oral Daily 11/05/23 1723 11/05/23 1725   10/15/23 1800  vancomycin (VANCOCIN) IVPB 1000 mg/200 mL premix  Status:  Discontinued        1,000 mg 200 mL/hr over 60 Minutes Intravenous Every 24 hours 10/14/23 1740 10/15/23 1045   10/15/23 1430   piperacillin-tazobactam (ZOSYN) IVPB 3.375 g        3.375 g 100 mL/hr over 30 Minutes Intravenous  Once 10/15/23 1343 10/15/23 1503   10/15/23 1400  DAPTOmycin (CUBICIN) IVPB 500 mg/21mL premix  Status:  Discontinued        8 mg/kg  64.1 kg 100 mL/hr over 30 Minutes Intravenous Daily 10/15/23 1045 10/22/23 1321   10/14/23 1800  vancomycin (VANCOREADY) IVPB 1250 mg/250 mL        1,250 mg 166.7 mL/hr over 90 Minutes Intravenous  Once 10/14/23 1740 10/14/23 2255   10/14/23 1400  piperacillin-tazobactam (ZOSYN) IVPB 3.375 g  Status:  Discontinued        3.375 g 12.5 mL/hr over 240 Minutes Intravenous Every 8 hours 10/14/23 0845 10/22/23 1321   10/14/23 0900  piperacillin-tazobactam (ZOSYN) IVPB 3.375 g        3.375 g 100 mL/hr over 30 Minutes Intravenous NOW 10/14/23 0844 10/14/23 1002   10/05/23 1200  piperacillin-tazobactam (ZOSYN) IVPB 3.375 g        3.375 g 12.5 mL/hr over 240 Minutes Intravenous Every 8 hours 10/05/23 0721 10/09/23 1716   10/04/23 2000  piperacillin-tazobactam (ZOSYN) IVPB 3.375 g  Status:  Discontinued        3.375 g 12.5 mL/hr over 240 Minutes Intravenous Every 8 hours 10/04/23 1827 10/05/23 0721   09/27/23 2315  piperacillin-tazobactam (ZOSYN) IVPB 3.375 g  Status:  Discontinued        3.375 g 12.5 mL/hr over 240 Minutes Intravenous Every 8 hours 09/27/23 2305 10/02/23 1106       Assessment/Plan: POD 37 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 [name] - CT 10/24 with hematoma in the pelvis adjacent to small bowel anastomosis and ileus vs obstruction  - IR 11/1: drain placement for small bowel anastomotic leak/ abscess drainage - CT 11/8 with resolution in previous right hemipelvic collection and no leakage of enteric contrast - drain injection study 11/25 shows resolution of fistula and drain was removed. - reg diet, supplements; appreciate ongoing care from dietitian.  P.o. intake has been okay (860) - not enough but better  than previous. Fistula has resolved so hopefully can increase PO intake and avoid resumption of  TPN. - continue daily dressing changes  - Needs mobilization and physical conditioning -Wean off IV medication/narcotics.    FEN: reg diet -appreciate ongoing care from dietitian, we will see how her appetite is off the TPN Foley - removed. Has some urinary incontinence.  VTE: IVC Filter  ID: no current abx; topical antifungal to yeast dermatitis in groin area  Okay for transition to SNF when bed available   ABL anemia secondary to post-op hematomas and hematuria  Acute PE and LLE DVT Pancreatic tail cyst Thyroid nodule  Chronic HFpEF Paroxysmal A. Fib Pre-diabetes RLS    Elizabeth Odom 11/07/2023

## 2023-11-07 NOTE — Progress Notes (Signed)
[facility] Progress Note Patient: Elizabeth Odom NGE:[id number] DOB: [birth year] DOA: 09/27/2023  DOS: the patient was seen and examined on 11/07/2023  Brief hospital course: 82 year old with past medical history significant for A-fib, RLS, anemia presented to the ED 10/17 with recurrent abdominal pain, nausea emesis, diarrhea intolerance of oral intake. She was evaluated in the ED 10/10 for abdominal pain, decreased appetite, fever, diagnosed with possible colitis noted on CT abdomen and pelvis discharged on Augmentin. She had recurrence of symptoms and presented back to the hospital. CT initially showed proximal sigmoid colon wall thickening with upstream colonic dilation with air-fluid levels and large amount of stool compatible with obstruction.   Assessment and Plan: Large bowel obstruction. SP Hartman's resection.  Colostomy creation. Koglucoid anastomotic leak with pelvic abscess requiring drain placement. Also had hematoma around the staple line. Postop ileus. Patient noted to have presented abdominal pain with CT abdomen and pelvis done 09/27/2023 with proximal sigmoid colon thickening. -Patient seen in consultation by GI underwent sigmoidoscopy 09/29/2023 which showed a tight stricture found to have a mass, biopsy was negative for malignancy suggesting inflammation. -General Surgery consulted status post exploratory laparotomy Hartman's resection 10/01/2023 with small bowel resection and ostomy per Dr. [name]. -Repeat CT abdomen and pelvis 10/11/2023 with small leak at the anastomosis going into 6.7 cm fluid collection. -Patient seen in consultation by IR underwent drain catheter placement of pelvic abscess on 10/12/2023 with cultures growing MRSA. -Patient seen in consultation by ID and treated with daptomycin and Zosyn. -Repeat CT abdomen and pelvis 10/18/2023 with percutaneous peak tail drain catheter with no persistent anastomotic leak or fistula. -10/22/2023 pelvic drain  injection study showed fistulous communication with colon/staple line anastomosis with drain to remain in place. -Per ID no further indication for antibiotics and antibiotics subsequently discontinued. -Was on TPN which has been weaned off per general surgery recommendations. -Diet advanced to regular diet which patient seems to be tolerating. -Continue daily dressings. -Mobilization. -Patient assessed by IR and patient underwent pelvic drain injection study and drain removal on 11/05/2023.   -Per general surgery.    2.  Hyponatremia -Was on D10 which was subsequently discontinued with improvement with hyponatremia. -Sodium fluctuating however seems to have stabilized currently at 132. -Follow.   3.  Acute blood loss anemia status post perianastomotic hematoma with hematuria -Status post transfusion 4 units PRBCs during the hospitalization. -Status post IVC filter. -Seen in consultation by urology no indication for cystoscopy or intervention at this time. -Hemoglobin stable at 10.7. -Follow H&H.   4.  Acute PE (small to moderate clot burden) without right heart strain/acute left lower extremity DVT -Patient noted unable to tolerate anticoagulation status post IVC filter placement 10/05/2023. -2D echo with no evidence of right heart strain. -CT scan with no evidence of right heart strain. -Outpatient follow-up with PCP.   5.  Thyroid nodule in the inferior aspect of the isthmus -Outpatient follow-up.   6.  Paroxysmal atrial fibrillation -Rate controlled.   -Not on anticoagulation secondary to problem #3.   7.  Hypokalemia/hypophosphatemia/hypomagnesemia -Patient was on TPN which has been weaned off. -Potassium currently at 3.6, phosphorus at 3.4, magnesium at 2.2.  -Repeat labs in AM.    8.  Urinary retention -Status post Foley catheter placement which has been discontinued..   -Monitor urine output.     9.  Hypotension -Resolved. -Follow.   10.   Leukocytosis -Resolved.   11.  Moderate protein calorie malnutrition -TPN weaned off.  Diet advanced.    12.  Pressure injury right Botox stage II/MASD Continue current dressing changes.  Goals of care conversation. Patient tells me that she wants to stop all the therapies. Patient tells me that no one at family can help her out with care at home. Patient tells me that she does not want to go to SNF and does not want to do dressing changes and does not want to do with IV and blood work. Patient tells me that she is not depressed. Recommended patient to discuss with the family as she tells me that her family is not agreeable with her current decision. She does not want any medication but she is very concerned with regards to infection in her sacrum. Explained in detail with regards to future prognosis on her current medical stability. Discussed with palliative care.  Will follow-up tomorrow.   Subjective: No nausea no vomiting.  Reports 8 out of 10 pain.  Physical Exam: General: in Mild distress, No Rash Cardiovascular: S1 and S2 Present, No Murmur Respiratory: Good respiratory effort, Bilateral Air entry present. No Crackles, No wheezes Abdomen: Bowel Sound present, No tenderness Extremities: No edema Neuro: Alert and oriented x3, no new focal deficit, anxious  Data Reviewed: I have Reviewed nursing notes, Vitals, and Lab results. Since last encounter, pertinent lab results CBC and BMP   . I have ordered test including CBC and BMP  .  Discussed with palliative care.  Disposition: Status is: Inpatient Remains inpatient appropriate because: Awaiting placement.  Place and maintain sequential compression device Start: 10/06/23 1319   Family Communication: No one at bedside Level of care: Med-Surg   Vitals:   11/06/23 2214 11/07/23 0607 11/07/23 0631 11/07/23 1357  BP: 125/71 116/63  (!) 107/51  Pulse: 79 (!) 47  (!) 50  Resp: 17 15  17   Temp: 97.8 F (36.6 C) 98.1 F (36.7  C)  98.4 F (36.9 C)  TempSrc: Oral Oral  Oral  SpO2: 100% 96%  98%  Weight:   58.2 kg   Height:         Author: Lynden Oxford, MD 11/07/2023 7:59 PM  Please look on [email or web address] to find out who is on call.

## 2023-11-07 NOTE — Progress Notes (Signed)
Nutrition Follow-up  DOCUMENTATION CODES:   Non-severe (moderate) malnutrition in context of chronic illness  INTERVENTION:   -Encourage PO intakes  -Continue Multivitamin with minerals daily  NUTRITION DIAGNOSIS:   Moderate Malnutrition related to chronic illness as evidenced by moderate fat depletion, severe muscle depletion.  Ongoing.  GOAL:   Patient will meet greater than or equal to 90% of their needs  MONITOR:   PO intake, Supplement acceptance, Diet advancement, Labs, Weight trends, I & O's  ASSESSMENT:   82 year old F with PMH of A-fib, RLS and anemia presented to ED on 10/17 with recurrent abdominal pain, nausea, emesis, diarrhea and intolerance of oral intake.  Patient was seen in ED on 10/10 for abdominal pain, decreased appetite and fever and discharged on Augmentin for possible colitis noted on CT abdomen and pelvis.  10/18: admitted 10/19: s/p flex sigmoidoscopy 10/21: s/p colectomy w/ colostomy 10/23: NPO 10/31: CLD -> NPO 11/1: TPN started 11/5: CLD 11/8: FLD 11/11: Soft diet 11/12: Calorie count started 11/13 Regular diet 11/14 Calorie count complete: meeting 23% kcal needs and 15% protein needs 11/15 2nd calorie count started, meeting average of 26% kcal needs and 29% protein needs 11/21 TPN discontinued  Patient not consuming Boost Breeze, will d/c. At this point no other protein options to offer as she has refused all we offer on our formulary.  Patient last documented PO: 75% of lunch on 11/25.   Admission weight: 124 lbs Current weight: 128 lbs  Medications: Colace, Multivitamin with minerals daily  Labs reviewed.  Diet Order:   Diet Order             Diet regular Room service appropriate? Yes; Fluid consistency: Thin  Diet effective now                   EDUCATION NEEDS:   Education needs have been addressed  Skin:  Skin Assessment: Skin Integrity Issues: Skin Integrity Issues:: Stage II, Incisions Stage II: mid  sacrum Incisions: 10/21 abdomen  Last BM:  11/26  Height:   Ht Readings from Last 1 Encounters:  10/04/23 5\' 5"  (1.651 m)    Weight:   Wt Readings from Last 1 Encounters:  11/07/23 58.2 kg    BMI:  Body mass index is 21.33 kg/m.  Estimated Nutritional Needs:   Kcal:  1900-2100  Protein:  90-105g  Fluid:  2L/day  Tilda Franco, MS, RD, LDN Inpatient Clinical Dietitian Contact information available via Amion

## 2023-11-07 NOTE — NC FL2 (Signed)
[place] MEDICAID [facility] LEVEL OF CARE FORM     IDENTIFICATION  Patient Name: Elizabeth Odom Birthdate: [birth year] Sex: female Admission Date (Current Location): 09/27/2023  [facility] and [place] Number:  Producer, television/film/video and Address:  [facility],  [address] [place], [place] [address]      Provider Number: [id number]  Attending Physician Name and Address:  Rolly Salter, MD  Relative Name and Phone Number:  Doneta Public   [phone]  [facility] Daughter [phone]  [phone]  [place], [place]   [phone]    Current Level of Care: Hospital Recommended Level of Care: Skilled Nursing Facility Prior Approval Number:    Date Approved/Denied:   PASRR Number: [id number]  Discharge Plan: SNF    Current Diagnoses: Patient Active Problem List   Diagnosis Date Noted   Iron deficiency anemia due to chronic blood loss 10/31/2023   Acute pulmonary embolism (HCC) 10/31/2023   Hyponatremia 10/31/2023   Acute urinary retention 10/31/2023   Malnutrition of moderate degree 10/17/2023   High risk medication use 10/16/2023   Pain 10/16/2023   Medication management 10/16/2023   Pressure injury of skin 10/16/2023   Large bowel obstruction (HCC) 10/05/2023   Left leg DVT (HCC) 10/05/2023   ABLA (acute blood loss anemia) 10/05/2023   Palliative care encounter 10/05/2023   Counseling and coordination of care 10/05/2023   Need for emotional support 10/05/2023   Goals of care, counseling/discussion 10/05/2023   Acute colitis 09/27/2023   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   Restless leg    Multiple fractures of cervical spine (HCC) 04/09/2016   Cervical spine fracture, initial encounter 04/09/2016   Syncope and collapse 04/09/2016   A-fib (HCC) 04/09/2016   Acute head injury 04/09/2016    Orientation RESPIRATION BLADDER Height & Weight     Place, Situation, Time, Self  O2 (2L continous nasal cannula)  Continent Weight: 128 lb 3.2 oz (58.2 kg) Height:  5\' 5"  (165.1 cm)  BEHAVIORAL SYMPTOMS/MOOD NEUROLOGICAL BOWEL NUTRITION STATUS      Continent Diet  AMBULATORY STATUS COMMUNICATION OF NEEDS Skin   Limited Assist Verbally PU Stage and Appropriate Care, Surgical wounds   PU Stage 2 Dressing:  (PRN dressing changes)                   Personal Care Assistance Level of Assistance  Bathing, Feeding, Dressing Bathing Assistance: Limited assistance Feeding assistance: Independent Dressing Assistance: Limited assistance     Functional Limitations Info  Sight, Hearing, Speech Sight Info: Adequate Hearing Info: Adequate Speech Info: Adequate    SPECIAL CARE FACTORS FREQUENCY  PT (By licensed PT), OT (By licensed OT)     PT Frequency: Minimum 5x a week OT Frequency: Minimum 5x a week            Contractures Contractures Info: Not present    Additional Factors Info  Code Status, Allergies Code Status Info: DNR Allergies Info: Elemental Sulfur           Current Medications (11/07/2023):  This is the current hospital active medication list Current Facility-Administered Medications  Medication Dose Route Frequency Provider Last Rate Last Admin   acetaminophen (TYLENOL) tablet 650 mg  650 mg Oral Q6H PRN Juliet Rude, PA-C   650 mg at 11/06/23 1302   Chlorhexidine Gluconate Cloth 2 % PADS 6 each  6 each Topical Daily Rai, Ripudeep K, MD   6 each at 11/07/23 0953   clotrimazole (LOTRIMIN) 1 %  cream   Topical BID Adam Phenix, [place]   Given at 11/07/23 1045   docusate sodium (COLACE) capsule 100 mg  100 mg Oral BID Juliet Rude, PA-C   100 mg at 11/07/23 4098   feeding supplement (BOOST / RESOURCE BREEZE) liquid 1 Container  1 Container Oral BID BM Rodolph Bong, MD   237 mL at 11/04/23 1191   Gerhardt's butt cream   Topical TID Rodolph Bong, MD   Given at 11/07/23 [phone]   HYDROmorphone (DILAUDID) injection 0.5 mg  0.5 mg Intravenous Q8H PRN Berna Bue, MD       ketotifen (ZADITOR) 0.035 % ophthalmic solution 2 drop  2 drop Both Eyes BID Rai, Ripudeep K, MD   2 drop at 11/07/23 0953   multivitamin with minerals tablet 1 tablet  1 tablet Oral Daily Cindi Carbon, [facility]   1 tablet at 11/07/23 0952   ondansetron (ZOFRAN) tablet 4 mg  4 mg Oral Q6H PRN Rai, Ripudeep K, MD       Or   ondansetron (ZOFRAN) injection 4 mg  4 mg Intravenous Q6H PRN Rai, Ripudeep K, MD   4 mg at 11/05/23 0843   oxyCODONE (Oxy IR/ROXICODONE) immediate release tablet 5 mg  5 mg Oral Q6H PRN Phylliss Blakes A, MD   5 mg at 11/07/23 1240   polyethylene glycol (MIRALAX / GLYCOLAX) packet 17 g  17 g Oral Daily PRN Rolly Salter, MD       polyvinyl alcohol (LIQUIFILM TEARS) 1.4 % ophthalmic solution 1 drop  1 drop Both Eyes PRN Rai, Ripudeep K, MD   1 drop at 11/05/23 1020   sodium chloride flush (NS) 0.9 % injection 10-40 mL  10-40 mL Intracatheter Q12H Rai, Ripudeep K, MD   10 mL at 11/04/23 0946   sodium chloride flush (NS) 0.9 % injection 10-40 mL  10-40 mL Intracatheter PRN Rai, Ripudeep K, MD       traMADol (ULTRAM) tablet 50 mg  50 mg Oral Q6H PRN Juliet Rude, PA-C   50 mg at 11/07/23 9562     Discharge Medications: Please see discharge summary for a list of discharge medications.  Relevant Imaging Results:  Relevant Lab Results:   Additional Information SSN [id number]  Darleene Cleaver, LCSW

## 2023-11-08 DIAGNOSIS — K56609 Unspecified intestinal obstruction, unspecified as to partial versus complete obstruction: Secondary | ICD-10-CM | POA: Diagnosis not present

## 2023-11-08 MED ORDER — ZINC SULFATE 220 (50 ZN) MG PO CAPS
220.0000 mg | ORAL_CAPSULE | Freq: Every day | ORAL | Status: DC
  Administered 2023-11-08 – 2023-11-09 (×2): 220 mg via ORAL
  Filled 2023-11-08 (×2): qty 1

## 2023-11-08 MED ORDER — VITAMIN C 500 MG PO TABS
1000.0000 mg | ORAL_TABLET | Freq: Every day | ORAL | Status: DC
  Administered 2023-11-08 – 2023-11-09 (×2): 1000 mg via ORAL
  Filled 2023-11-08 (×2): qty 2

## 2023-11-08 MED ORDER — BOOST / RESOURCE BREEZE PO LIQD CUSTOM: 1.0000 | Freq: Three times a day (TID) | ORAL | Status: DC

## 2023-11-08 MED ORDER — JUVEN PO PACK
1.0000 | PACK | Freq: Two times a day (BID) | ORAL | Status: DC
  Filled 2023-11-08: qty 1

## 2023-11-08 NOTE — TOC Progression Note (Signed)
Transition of Care [facility]) - Progression Note    Patient Details  Name: Elizabeth Odom MRN: [id number] Date of Birth: 06-30-1941  Transition of [facility]) CM/SW Contact  Darleene Cleaver, [place] Phone Number: 11/08/2023, 10:44 AM  Clinical Narrative:     CSW spoke to patient's granddaughter [name] [phone] via phone to discuss bed offers and insurance authorization.  CSW informed patient's granddaughter, that there a few other facilities who have offered a bed for patient.  CSW provided bed offers which were:  [facility]   [facility]   [place], [place].    [facility]   [facility]   [facility]   [facility]   [facility]   [facility]    Granddaughter requested the bed offers to be emailed to her at [email or web address].  CSW sent secure email to her to review options.  CSW informed her that insurance authorization and EMS transport is valid through end of business day tomorrow, and if patient does not discharge, authorization will have to be started again.  Patient's granddaughter expressed understanding, and will try to review options and let CSW know of choice.  Patient's granddaughter asked if patients are discharged on the weekend, CSW informed her yes they are it just depends on if SNF can accept over weekend or not.  Some are able to some are not.  Patient's granddaughter asked if patient is medically ready for discharge and she does not want to leave over the weekend, does she have a choice.  CSW informed her that technically she can appeal the discharge if she wants, however if Medicare agrees with discharge, patient would be responsible for paying the cost of the days while Medicare is deciding on the appeal.    CSW also tried calling [facility] again since this was their first choice, per [place], at [facility] she does not have any beds available at this time.  CSW updated patient's granddaughter  as well.  TOC to continue to follow patient's progress throughout discharge planning.     Expected Discharge Plan: Skilled Nursing Facility Barriers to Discharge: Continued Medical Work up, English as a second language teacher, SNF Pending bed offer  Expected Discharge Plan and Services In-house Referral: NA Discharge Planning Services: CM Consult Post Acute Care Choice: Home Health Living arrangements for the past 2 months: Apartment                 DME Arranged: N/A DME Agency: NA       HH Arranged: PT, RN HH Agency: [facility] Date HH Agency Contacted: 10/02/23 Time HH Agency Contacted: 1550 Representative spoke with at [facility] Agency: Morrie Sheldon   Social Determinants of Health (SDOH) Interventions SDOH Screenings   Food Insecurity: No Food Insecurity (09/28/2023)  Housing: Low Risk  (09/28/2023)  Transportation Needs: No Transportation Needs (09/28/2023)  Utilities: Not At Risk (09/28/2023)  Tobacco Use: Low Risk  (10/04/2023)    Readmission Risk Interventions    10/19/2023    6:13 PM  Readmission Risk Prevention Plan  Transportation Screening Complete  PCP or Specialist Appt within 5-7 Days Complete  Home Care Screening Complete  Medication Review (RN CM) Complete

## 2023-11-08 NOTE — Plan of Care (Signed)
  Problem: Metabolic: Goal: Ability to maintain appropriate glucose levels will improve Outcome: Progressing   Problem: Coping: Goal: Ability to adjust to condition or change in health will improve Outcome: Not Progressing   Problem: Fluid Volume: Goal: Ability to maintain a balanced intake and output will improve Outcome: Not Progressing   Problem: Nutritional: Goal: Maintenance of adequate nutrition will improve Outcome: Not Progressing   Problem: Skin Integrity: Goal: Risk for impaired skin integrity will decrease Outcome: Not Progressing   Problem: Tissue Perfusion: Goal: Adequacy of tissue perfusion will improve Outcome: Not Progressing

## 2023-11-08 NOTE — Progress Notes (Signed)
[facility] Progress Note Patient: Elizabeth Odom GEX:[id number] DOB: Mar 17, 1941 DOA: 09/27/2023  DOS: the patient was seen and examined on 11/08/2023  Brief hospital course: 82 year old with past medical history significant for A-fib, RLS, anemia presented to the ED 10/17 with recurrent abdominal pain, nausea emesis, diarrhea intolerance of oral intake. She was evaluated in the ED 10/10 for abdominal pain, decreased appetite, fever, diagnosed with possible colitis noted on CT abdomen and pelvis discharged on Augmentin. She had recurrence of symptoms and presented back to the hospital. CT initially showed proximal sigmoid colon wall thickening with upstream colonic dilation with air-fluid levels and large amount of stool compatible with obstruction.   Assessment and Plan: Large bowel obstruction. SP Hartman's resection.  Colostomy creation. anastomotic leak with pelvic abscess requiring drain placement. Also had hematoma around the staple line. Postop ileus. Patient noted to have presented abdominal pain with CT abdomen and pelvis done 09/27/2023 with proximal sigmoid colon thickening. -Patient seen in consultation by GI underwent sigmoidoscopy 09/29/2023 which showed a tight stricture found to have a mass, biopsy was negative for malignancy suggesting inflammation. -General Surgery consulted status post exploratory laparotomy Hartman's resection 10/01/2023 with small bowel resection and ostomy per Dr. [name]. -Repeat CT abdomen and pelvis 10/11/2023 with small leak at the anastomosis going into 6.7 cm fluid collection. -Patient seen in consultation by IR underwent drain catheter placement of pelvic abscess on 10/12/2023 with cultures growing MRSA. -Patient seen in consultation by ID and treated with daptomycin and Zosyn. -Repeat CT abdomen and pelvis 10/18/2023 with percutaneous peak tail drain catheter with no persistent anastomotic leak or fistula. -10/22/2023 pelvic drain injection study  showed fistulous communication with colon/staple line anastomosis with drain to remain in place. -Per ID no further indication for antibiotics and antibiotics subsequently discontinued. -Was on TPN which has been weaned off per general surgery recommendations. -Diet advanced to regular diet which patient seems to be tolerating. -Continue daily dressings. -Mobilization. -Patient assessed by IR and patient underwent pelvic drain injection study and drain removal on 11/05/2023.   -Per general surgery.    Hyponatremia -Was on D10 which was subsequently discontinued with improvement with hyponatremia. -Sodium fluctuating however seems to have stabilized currently at 132. -Follow.   Acute blood loss anemia status post perianastomotic hematoma with hematuria -Status post transfusion 4 units PRBCs during the hospitalization. -Status post IVC filter. -Seen in consultation by urology no indication for cystoscopy or intervention at this time. Hemoglobin stable.   Acute PE (small to moderate clot burden) without right heart strain/acute left lower extremity DVT -Patient noted unable to tolerate anticoagulation status post IVC filter placement 10/05/2023. -2D echo with no evidence of right heart strain. -CT scan with no evidence of right heart strain. -Outpatient follow-up with PCP.   Thyroid nodule in the inferior aspect of the isthmus -Outpatient follow-up.    Paroxysmal atrial fibrillation -Rate controlled.   -Not on anticoagulation   Hypokalemia/hypophosphatemia/hypomagnesemia -Patient was on TPN which has been weaned off.   Urinary retention -Status post Foley catheter placement which has been discontinued..   -Monitor urine output.     Hypotension -Resolved. -Follow.    Leukocytosis -Resolved.   Moderate protein calorie malnutrition -TPN weaned off.  Diet advanced.    Pressure injury right Botox stage II/MASD Continue current dressing changes.  Drain wound. Will consult  wound care.  Goals of care conversation. Patient tells me that she wants to stop all the therapies. Patient tells me that no one at family can help her  out with care at home. Patient tells me that she does not want to go to SNF and does not want to do dressing changes and does not want to do with IV and blood work. Patient tells me that she is not depressed. Recommended patient to discuss with the family as she tells me that her family is not agreeable with her current decision. She does not want any medication but she is very concerned with regards to infection in her sacrum. Explained in detail with regards to future prognosis on her current medical stability. Discussed with palliative care.    Further discussion with patient on 11/28.  Currently agreeable to go to SNF and work with physical therapy with ongoing plan for treatment although continues to refuse interventions and therapy that might be helpful for better pain control, diet improvement or appetite improvement.   Subjective: No nausea no vomiting no fever no chills.  Pain not controlled when I asked her initially but later on says her pain is well-controlled with Tylenol.  Physical Exam: General: in Mild distress, No Rash Cardiovascular: S1 and S2 Present, No Murmur Respiratory: Good respiratory effort, Bilateral Air entry present. No Crackles, No wheezes Abdomen: Bowel Sound present, No tenderness The right gluteal region has an wound from drain placement with some serosanguineous drainage. Extremities: No edema Neuro: Alert and oriented x3, no new focal deficit  Data Reviewed: I have Reviewed nursing notes, Vitals, and Lab results. I have ordered test including CBC and CMP  . I have discussed pt's care plan and test results with palliative care  .   Disposition: Status is: Inpatient Remains inpatient appropriate because: Awaiting placement medically stable.  Place and maintain sequential compression device Start:  10/06/23 1319   Family Communication: Discussed with granddaughter on the phone. Level of care: Med-Surg   Vitals:   11/07/23 2045 11/08/23 0617 11/08/23 0617 11/08/23 1431  BP: (!) 111/55 (!) 110/55  (!) 107/53  Pulse: 82 67 78 72  Resp: 18 16    Temp: 97.8 F (36.6 C)   97.8 F (36.6 C)  TempSrc: Oral   Oral  SpO2: 97%  100% 97%  Weight:      Height:         Author: Lynden Oxford, MD 11/08/2023 7:31 PM  Please look on [email or web address] to find out who is on call.

## 2023-11-08 NOTE — Progress Notes (Signed)
38 Days Post-Op   Subjective/Chief Complaint: No complaints other than soreness   Objective: Vital signs in last 24 hours: Temp:  [97.8 F (36.6 C)-98.4 F (36.9 C)] 97.8 F (36.6 C) (11/27 2045) Pulse Rate:  [50-82] 78 (11/28 0617) Resp:  [16-18] 16 (11/28 0617) BP: (107-111)/(51-55) 110/55 (11/28 0617) SpO2:  [97 %-100 %] 100 % (11/28 0617) Last BM Date : 11/06/23  Intake/Output from previous day: 11/27 0701 - 11/28 0700 In: 780 [P.O.:780] Out: 150 [Urine:150] Intake/Output this shift: No intake/output data recorded.  General appearance: alert and cooperative Resp: clear to auscultation bilaterally Cardio: regular rate and rhythm GI: soft, mild tenderness. Ostomy pink with air in bag  Lab Results:  Recent Labs    11/06/23 0350 11/07/23 0410  WBC 6.4 5.9  HGB 10.7* 10.1*  HCT 33.7* 32.0*  PLT 275 264   BMET Recent Labs    11/06/23 0350 11/07/23 0410  NA 132* 135  K 3.6 3.6  CL 98 101  CO2 25 26  GLUCOSE 120* 113*  BUN 33* 25*  CREATININE 0.87 0.85  CALCIUM 9.1 9.2   PT/INR No results for input(s): "LABPROT", "INR" in the last 72 hours. ABG No results for input(s): "PHART", "HCO3" in the last 72 hours.  Invalid input(s): "PCO2", "PO2"  Studies/Results: No results found.  Anti-infectives: Anti-infectives (From admission, onward)    Start     Dose/Rate Route Frequency Ordered Stop   11/05/23 1830  fluconazole (DIFLUCAN) IVPB 200 mg        200 mg 100 mL/hr over 60 Minutes Intravenous  Once 11/05/23 1723 11/05/23 2227   11/05/23 1815  fluconazole (DIFLUCAN) tablet 200 mg  Status:  Discontinued        200 mg Oral Daily 11/05/23 1723 11/05/23 1725   10/15/23 1800  vancomycin (VANCOCIN) IVPB 1000 mg/200 mL premix  Status:  Discontinued        1,000 mg 200 mL/hr over 60 Minutes Intravenous Every 24 hours 10/14/23 1740 10/15/23 1045   10/15/23 1430  piperacillin-tazobactam (ZOSYN) IVPB 3.375 g        3.375 g 100 mL/hr over 30 Minutes Intravenous   Once 10/15/23 1343 10/15/23 1503   10/15/23 1400  DAPTOmycin (CUBICIN) IVPB 500 mg/2mL premix  Status:  Discontinued        8 mg/kg  64.1 kg 100 mL/hr over 30 Minutes Intravenous Daily 10/15/23 1045 10/22/23 1321   10/14/23 1800  vancomycin (VANCOREADY) IVPB 1250 mg/250 mL        1,250 mg 166.7 mL/hr over 90 Minutes Intravenous  Once 10/14/23 1740 10/14/23 2255   10/14/23 1400  piperacillin-tazobactam (ZOSYN) IVPB 3.375 g  Status:  Discontinued        3.375 g 12.5 mL/hr over 240 Minutes Intravenous Every 8 hours 10/14/23 0845 10/22/23 1321   10/14/23 0900  piperacillin-tazobactam (ZOSYN) IVPB 3.375 g        3.375 g 100 mL/hr over 30 Minutes Intravenous NOW 10/14/23 0844 10/14/23 1002   10/05/23 1200  piperacillin-tazobactam (ZOSYN) IVPB 3.375 g        3.375 g 12.5 mL/hr over 240 Minutes Intravenous Every 8 hours 10/05/23 0721 10/09/23 1716   10/04/23 2000  piperacillin-tazobactam (ZOSYN) IVPB 3.375 g  Status:  Discontinued        3.375 g 12.5 mL/hr over 240 Minutes Intravenous Every 8 hours 10/04/23 1827 10/05/23 0721   09/27/23 2315  piperacillin-tazobactam (ZOSYN) IVPB 3.375 g  Status:  Discontinued  3.375 g 12.5 mL/hr over 240 Minutes Intravenous Every 8 hours 09/27/23 2305 10/02/23 1106       Assessment/Plan: s/p Procedure(s): COLECTOMY WITH COLOSTOMY CREATION/HARTMANN PROCEDURE (N/A) Advance diet POD 38 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 [name] - CT 10/24 with hematoma in the pelvis adjacent to small bowel anastomosis and ileus vs obstruction  - IR 11/1: drain placement for small bowel anastomotic leak/ abscess drainage - CT 11/8 with resolution in previous right hemipelvic collection and no leakage of enteric contrast - drain injection study 11/25 shows resolution of fistula and drain was removed. - reg diet, supplements; appreciate ongoing care from dietitian.  P.o. intake has been okay (860) - not enough but better than  previous. Fistula has resolved so hopefully can increase PO intake and avoid resumption of  TPN. - continue daily dressing changes  - Needs mobilization and physical conditioning -Wean off IV medication/narcotics.     FEN: reg diet -appreciate ongoing care from dietitian, we will see how her appetite is off the TPN Foley - removed. Has some urinary incontinence.  VTE: IVC Filter  ID: no current abx; topical antifungal to yeast dermatitis in groin area  Okay for transition to SNF when bed available   ABL anemia secondary to post-op hematomas and hematuria  Acute PE and LLE DVT Pancreatic tail cyst Thyroid nodule  Chronic HFpEF Paroxysmal A. Fib Pre-diabetes RLS   LOS: 42 days    Chevis Pretty III 11/08/2023

## 2023-11-08 NOTE — Progress Notes (Signed)
Daily Progress Note   Patient Name: Elizabeth Odom       Date: 11/08/2023 DOB: [birth year]  Age: 82 y.o. MRN#: [id number] Attending Physician: Rolly Salter, MD Primary Care Physician: [name], FNP Admit Date: 09/27/2023  Reason for Consultation/Follow-up: Establishing goals of care  Subjective:  Awake alert, no acute distress. Sitting in chair.   Length of Stay: 42  Current Medications: Scheduled Meds:   acetaminophen  1,000 mg Oral QID   Chlorhexidine Gluconate Cloth  6 each Topical Daily   clotrimazole   Topical BID   docusate sodium  100 mg Oral BID   feeding supplement  1 Container Oral BID BM   Gerhardt's butt cream   Topical TID   ketotifen  2 drop Both Eyes BID   multivitamin with minerals  1 tablet Oral Daily   sodium chloride flush  10-40 mL Intracatheter Q12H    Continuous Infusions:   PRN Meds: HYDROmorphone (DILAUDID) injection, ondansetron **OR** ondansetron (ZOFRAN) IV, oxyCODONE **OR** oxyCODONE, polyethylene glycol, polyvinyl alcohol, sodium chloride flush  Physical Exam         Elderly lady sitting in chair No acute distress Regular work of breathing  Vital Signs: BP (!) 110/55 (BP Location: Right Arm)   Pulse 78   Temp 97.8 F (36.6 C) (Oral)   Resp 16   Ht 5\' 5"  (1.651 m)   Wt 58.2 kg   SpO2 100%   BMI 21.33 kg/m  SpO2: SpO2: 100 % O2 Device: O2 Device: Room Air O2 Flow Rate: O2 Flow Rate (L/min): 2 L/min  Intake/output summary:  Intake/Output Summary (Last 24 hours) at 11/08/2023 1152 Last data filed at 11/08/2023 1000 Gross per 24 hour  Intake 780 ml  Output 150 ml  Net 630 ml   LBM: Last BM Date : 11/08/23 Baseline Weight: Weight: 56.2 kg Most recent weight: Weight: 58.2 kg       Palliative  Assessment/Data:      Patient Active Problem List   Diagnosis Date Noted   Iron deficiency anemia due to chronic blood loss 10/31/2023   Acute pulmonary embolism (HCC) 10/31/2023   Hyponatremia 10/31/2023   Acute urinary retention 10/31/2023   Malnutrition of moderate degree 10/17/2023   High risk medication use 10/16/2023   Pain 10/16/2023   Medication management 10/16/2023  Pressure injury of skin 10/16/2023   Large bowel obstruction (HCC) 10/05/2023   Left leg DVT (HCC) 10/05/2023   ABLA (acute blood loss anemia) 10/05/2023   Palliative care encounter 10/05/2023   Counseling and coordination of care 10/05/2023   Need for emotional support 10/05/2023   Goals of care, counseling/discussion 10/05/2023   Acute colitis 09/27/2023   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   Restless leg    Multiple fractures of cervical spine (HCC) 04/09/2016   Cervical spine fracture, initial encounter 04/09/2016   Syncope and collapse 04/09/2016   A-fib (HCC) 04/09/2016   Acute head injury 04/09/2016    Palliative Care Assessment & Plan   Patient Profile:    Assessment: 82 year old lady with past medical history of A-fib restless leg syndrome and anemia.  Patient with prolonged hospitalization, admitted to hospital medicine service with surgical colleagues following for large bowel obstruction status post Hartman's resection and colostomy creation.  Patient with anastomotic leak with pelvic abscess requiring drain placement and postop ileus. Patient was on TPN but this has been weaned off.  Infectious disease was consulted and antibiotics have been discontinued.  Recommendations/Plan: Discussed with [facility] MD and evening on 11-07-2023.  Patient wished to rediscuss goals of care.  Discussed with patient in a.m. on 11-08-2023.  She is awake and alert, she is decisional.  We reviewed about her current condition and how this prolonged hospitalization has gone.  Goals  wishes and values attempted to be explored.  Briefly outlined the differences between hospice and palliative.  Answered all of the patient's questions.  Subsequently discussed with Dr. Allena Katz [facility] MD.  No acute inpatient palliative specific recommendations at this time.  Recommend outpatient palliative support at SNF whether it is for rehab or long-term.   Code Status:    Code Status Orders  (From admission, onward)           Start     Ordered   10/04/23 1942  Do not attempt resuscitation (DNR)- Limited -Do Not Intubate (DNI)  (Code Status)  Continuous       Question Answer Comment  If pulseless and not breathing No CPR or chest compressions.   In Pre-Arrest Conditions (Patient Is Breathing and Has A Pulse) Do not intubate. Provide all appropriate non-invasive medical interventions. Avoid ICU transfer unless indicated or required.   Consent: Discussion documented in EHR or advanced directives reviewed      10/04/23 1941           Code Status History     Date Active Date Inactive Code Status Order ID Comments User Context   09/27/2023 2335 10/04/2023 1940 Full Code [id number]  Gery Pray, MD ED   04/09/2016 0504 04/10/2016 2032 Full Code [id number]  Ron Parker, MD ED      Advance Directive Documentation    Flowsheet Row Most Recent Value  Type of Advance Directive Healthcare Power of Attorney  Pre-existing out of facility DNR order (yellow form or pink MOST form) --  "MOST" Form in Place? --       Prognosis:  Unable to determine  Discharge Planning: To Be Determined  Care plan was discussed with patient and [facility] MD Dr Allena Katz.   Thank you for allowing the Palliative Medicine Team to assist in the care of this patient.  Mod MDM.      Greater than 50%  of this time was spent counseling and coordinating care related to the above assessment and plan.  Daveda Larock  Linna Darner, MD  Please contact Palliative Medicine Team phone at [phone] for questions and concerns.

## 2023-11-09 DIAGNOSIS — M79652 Pain in left thigh: Secondary | ICD-10-CM | POA: Diagnosis not present

## 2023-11-09 DIAGNOSIS — L988 Other specified disorders of the skin and subcutaneous tissue: Secondary | ICD-10-CM | POA: Diagnosis not present

## 2023-11-09 DIAGNOSIS — R278 Other lack of coordination: Secondary | ICD-10-CM | POA: Diagnosis not present

## 2023-11-09 DIAGNOSIS — M6259 Muscle wasting and atrophy, not elsewhere classified, multiple sites: Secondary | ICD-10-CM | POA: Diagnosis not present

## 2023-11-09 DIAGNOSIS — R2681 Unsteadiness on feet: Secondary | ICD-10-CM | POA: Diagnosis not present

## 2023-11-09 DIAGNOSIS — Z7401 Bed confinement status: Secondary | ICD-10-CM | POA: Diagnosis not present

## 2023-11-09 DIAGNOSIS — K56699 Other intestinal obstruction unspecified as to partial versus complete obstruction: Secondary | ICD-10-CM | POA: Diagnosis not present

## 2023-11-09 DIAGNOSIS — K529 Noninfective gastroenteritis and colitis, unspecified: Secondary | ICD-10-CM | POA: Diagnosis not present

## 2023-11-09 DIAGNOSIS — I82402 Acute embolism and thrombosis of unspecified deep veins of left lower extremity: Secondary | ICD-10-CM | POA: Diagnosis not present

## 2023-11-09 DIAGNOSIS — I2699 Other pulmonary embolism without acute cor pulmonale: Secondary | ICD-10-CM | POA: Diagnosis not present

## 2023-11-09 DIAGNOSIS — Z48815 Encounter for surgical aftercare following surgery on the digestive system: Secondary | ICD-10-CM | POA: Diagnosis not present

## 2023-11-09 DIAGNOSIS — E440 Moderate protein-calorie malnutrition: Secondary | ICD-10-CM | POA: Diagnosis not present

## 2023-11-09 DIAGNOSIS — K56609 Unspecified intestinal obstruction, unspecified as to partial versus complete obstruction: Secondary | ICD-10-CM | POA: Diagnosis not present

## 2023-11-09 DIAGNOSIS — Z933 Colostomy status: Secondary | ICD-10-CM | POA: Diagnosis not present

## 2023-11-09 DIAGNOSIS — D62 Acute posthemorrhagic anemia: Secondary | ICD-10-CM | POA: Diagnosis not present

## 2023-11-09 DIAGNOSIS — K9400 Colostomy complication, unspecified: Secondary | ICD-10-CM | POA: Diagnosis not present

## 2023-11-09 DIAGNOSIS — E871 Hypo-osmolality and hyponatremia: Secondary | ICD-10-CM | POA: Diagnosis not present

## 2023-11-09 DIAGNOSIS — A4902 Methicillin resistant Staphylococcus aureus infection, unspecified site: Secondary | ICD-10-CM | POA: Diagnosis not present

## 2023-11-09 DIAGNOSIS — Z95828 Presence of other vascular implants and grafts: Secondary | ICD-10-CM | POA: Diagnosis not present

## 2023-11-09 DIAGNOSIS — T8189XA Other complications of procedures, not elsewhere classified, initial encounter: Secondary | ICD-10-CM | POA: Diagnosis not present

## 2023-11-09 DIAGNOSIS — M6281 Muscle weakness (generalized): Secondary | ICD-10-CM | POA: Diagnosis not present

## 2023-11-09 MED ORDER — ONDANSETRON HCL 4 MG PO TABS: 4.0000 mg | ORAL_TABLET | Freq: Four times a day (QID) | ORAL | 0 refills | Status: DC | PRN

## 2023-11-09 MED ORDER — POLYETHYLENE GLYCOL 3350 17 G PO PACK: 17.0000 g | PACK | Freq: Every day | ORAL | 0 refills | Status: DC | PRN

## 2023-11-09 MED ORDER — CLOTRIMAZOLE 1 % EX CREA: TOPICAL_CREAM | Freq: Two times a day (BID) | CUTANEOUS | 0 refills | Status: DC

## 2023-11-09 MED ORDER — ZINC SULFATE 220 (50 ZN) MG PO CAPS: 220.0000 mg | ORAL_CAPSULE | Freq: Every day | ORAL | 0 refills | Status: DC

## 2023-11-09 MED ORDER — GERHARDT'S BUTT CREAM: 1.0000 | TOPICAL_CREAM | Freq: Three times a day (TID) | CUTANEOUS | 0 refills | Status: DC

## 2023-11-09 MED ORDER — JUVEN PO PACK: 1.0000 | PACK | Freq: Two times a day (BID) | ORAL | 0 refills | Status: DC

## 2023-11-09 MED ORDER — DOCUSATE SODIUM 100 MG PO CAPS: 100.0000 mg | ORAL_CAPSULE | Freq: Two times a day (BID) | ORAL | 0 refills | Status: DC

## 2023-11-09 MED ORDER — ADULT MULTIVITAMIN W/MINERALS CH: 1.0000 | ORAL_TABLET | Freq: Every day | ORAL | 0 refills | Status: DC

## 2023-11-09 MED ORDER — ACETAMINOPHEN 500 MG PO TABS: 1000.0000 mg | ORAL_TABLET | Freq: Four times a day (QID) | ORAL | Status: DC | PRN

## 2023-11-09 MED ORDER — OXYCODONE HCL 5 MG PO TABS: 5.0000 mg | ORAL_TABLET | ORAL | 0 refills | Status: DC | PRN

## 2023-11-09 MED ORDER — ADULT MULTIVITAMIN W/MINERALS CH: 1.0000 | ORAL_TABLET | Freq: Every day | ORAL | 0 refills | Status: AC

## 2023-11-09 MED ORDER — ASCORBIC ACID 1000 MG PO TABS: 1000.0000 mg | ORAL_TABLET | Freq: Every day | ORAL | 0 refills | Status: DC

## 2023-11-09 NOTE — Consult Note (Signed)
WOC Nurse Consult Note: Reason for Consult: buttock wound Previous drain site right upper buttock ICD with candida overgrowth, bilateral buttocks and inner thighs  Wound type: ICD; irritant contact dermatitis ICD-10 CM Codes for Irritant Dermatitis L24A2 - Due to fecal, urinary or dual incontinence 2. Full thickness; drain site right upper buttock  Pressure Injury POA: NA; wounds are not related to pressure Measurement: Left buttock; 0.5cm x 2.0cm linear area with 0.1 depth, superficial skin loss associated with moisture Right upper buttock 0.4c x 0.4cm x (did not measure depth) was previous drain site.  Wound bed: both clean and pink Drainage (amount, consistency, odor) none Periwound: epibole of wound edges at drain site, some hardening of the skin related to track formation  Dressing procedure/placement/frequency: Added single layer of xeroform to the left buttock, topped with foam.  Will need bedside nursing to add 1/4" iodoform packing to the right buttock wound daily.    Discussed limiting use of briefs, patient has them from home, using Purewick in patient with briefs per patient's request. Discussed use of LALM to benefit patient for moisture management, removing doubled layers of underpads to allow for air flow to maximize treatment to affected skin. Limit time up in the chair, using chair pressure redistribution pad, reminding patient to shift weight in chair, ambulate as much as possible and turn on her side while in the bed.   Ostomy pouch intact, changed per staff. Patient will be completely dependent in the care of her ostomy due to hand/finger deformities related to arthritis. Patient has not been willing to try any of the care during this hospitalization despite WOC nursing attempts.   Re consult if needed, will not follow at this time. Thanks  Anaira Seay [employer], RN,CWOCN, CNS, CWON-AP [phone])

## 2023-11-09 NOTE — Progress Notes (Signed)
Report called to [facility], granddaughter notified of imminent transport to facility. Pt transported via PTAR to facility w all belongings in stable condition.

## 2023-11-09 NOTE — TOC Progression Note (Addendum)
Transition of Care [facility]) - Progression Note    Patient Details  Name: Elizabeth Odom MRN: [id number] Date of Birth: 03-05-1941  Transition of [facility]) CM/SW Contact  Adrian Prows, RN Phone Number: 11/09/2023, 9:44 AM  Clinical Narrative:    Elizabeth Odom w/ pt in room, and grand dtr Elizabeth Odom on speaker phone; they have not chosen facility; pt says she does know what to do; Elizabeth Odom says she has not had time to review/tour facilities; explained this is a time sensitive matter; they agree to have a decision by 12 noon; awaiting choice.  -1206- spoke w/ pt in room; pt says she has not decided on SNF; attempted to contact her Grand dtr Elizabeth Odom; LVM at [phone]; awaiting return call  -1218- callback from Elizabeth Odom; she says they have selected Elizabeth Odom; attempted to notify [facility] admissions at facility; mailbox full; unable to LVM; called facility main #; Elizabeth Odom, Admission notified; she will call back w/ room # and call report #; pt will be transported by PTAR.   Expected Discharge Plan: Skilled Nursing Facility Barriers to Discharge: Continued Medical Work up, English as a second language teacher, SNF Pending bed offer  Expected Discharge Plan and Services In-house Referral: NA Discharge Planning Services: CM Consult Post Acute Care Choice: Home Health Living arrangements for the past 2 months: Apartment                 DME Arranged: N/A DME Agency: NA       HH Arranged: PT, RN HH Agency: [facility] Date HH Agency Contacted: 10/02/23 Time HH Agency Contacted: 1550 Representative spoke with at [facility] Agency: Morrie Sheldon   Social Determinants of Health (SDOH) Interventions SDOH Screenings   Food Insecurity: No Food Insecurity (09/28/2023)  Housing: Low Risk  (09/28/2023)  Transportation Needs: No Transportation Needs (09/28/2023)  Utilities: Not At Risk (09/28/2023)  Tobacco Use: Low Risk  (10/04/2023)    Readmission Risk Interventions     10/19/2023    6:13 PM  Readmission Risk Prevention Plan  Transportation Screening Complete  PCP or Specialist Appt within 5-7 Days Complete  Home Care Screening Complete  Medication Review (RN CM) Complete

## 2023-11-09 NOTE — Progress Notes (Signed)
39 Days Post-Op   Subjective/Chief Complaint: No specific complaint   Objective: Vital signs in last 24 hours: Temp:  [97.6 F (36.4 C)-97.8 F (36.6 C)] 97.6 F (36.4 C) (11/29 0348) Pulse Rate:  [48-76] 76 (11/29 0348) Resp:  [16] 16 (11/29 0348) BP: (103-127)/(51-61) 127/61 (11/29 0348) SpO2:  [97 %-100 %] 100 % (11/29 0348) Last BM Date : 11/09/23  Intake/Output from previous day: 11/28 0701 - 11/29 0700 In: 480 [P.O.:480] Out: 320 [Urine:320] Intake/Output this shift: No intake/output data recorded.  General appearance: alert and cooperative Resp: clear to auscultation bilaterally Cardio: regular rate and rhythm GI: soft, mild tenderness. Wound clean. Ostomy pink and productive  Lab Results:  Recent Labs    11/07/23 0410  WBC 5.9  HGB 10.1*  HCT 32.0*  PLT 264   BMET Recent Labs    11/07/23 0410  NA 135  K 3.6  CL 101  CO2 26  GLUCOSE 113*  BUN 25*  CREATININE 0.85  CALCIUM 9.2   PT/INR No results for input(s): "LABPROT", "INR" in the last 72 hours. ABG No results for input(s): "PHART", "HCO3" in the last 72 hours.  Invalid input(s): "PCO2", "PO2"  Studies/Results: No results found.  Anti-infectives: Anti-infectives (From admission, onward)    Start     Dose/Rate Route Frequency Ordered Stop   11/05/23 1830  fluconazole (DIFLUCAN) IVPB 200 mg        200 mg 100 mL/hr over 60 Minutes Intravenous  Once 11/05/23 1723 11/05/23 2227   11/05/23 1815  fluconazole (DIFLUCAN) tablet 200 mg  Status:  Discontinued        200 mg Oral Daily 11/05/23 1723 11/05/23 1725   10/15/23 1800  vancomycin (VANCOCIN) IVPB 1000 mg/200 mL premix  Status:  Discontinued        1,000 mg 200 mL/hr over 60 Minutes Intravenous Every 24 hours 10/14/23 1740 10/15/23 1045   10/15/23 1430  piperacillin-tazobactam (ZOSYN) IVPB 3.375 g        3.375 g 100 mL/hr over 30 Minutes Intravenous  Once 10/15/23 1343 10/15/23 1503   10/15/23 1400  DAPTOmycin (CUBICIN) IVPB 500  mg/81mL premix  Status:  Discontinued        8 mg/kg  64.1 kg 100 mL/hr over 30 Minutes Intravenous Daily 10/15/23 1045 10/22/23 1321   10/14/23 1800  vancomycin (VANCOREADY) IVPB 1250 mg/250 mL        1,250 mg 166.7 mL/hr over 90 Minutes Intravenous  Once 10/14/23 1740 10/14/23 2255   10/14/23 1400  piperacillin-tazobactam (ZOSYN) IVPB 3.375 g  Status:  Discontinued        3.375 g 12.5 mL/hr over 240 Minutes Intravenous Every 8 hours 10/14/23 0845 10/22/23 1321   10/14/23 0900  piperacillin-tazobactam (ZOSYN) IVPB 3.375 g        3.375 g 100 mL/hr over 30 Minutes Intravenous NOW 10/14/23 0844 10/14/23 1002   10/05/23 1200  piperacillin-tazobactam (ZOSYN) IVPB 3.375 g        3.375 g 12.5 mL/hr over 240 Minutes Intravenous Every 8 hours 10/05/23 0721 10/09/23 1716   10/04/23 2000  piperacillin-tazobactam (ZOSYN) IVPB 3.375 g  Status:  Discontinued        3.375 g 12.5 mL/hr over 240 Minutes Intravenous Every 8 hours 10/04/23 1827 10/05/23 0721   09/27/23 2315  piperacillin-tazobactam (ZOSYN) IVPB 3.375 g  Status:  Discontinued        3.375 g 12.5 mL/hr over 240 Minutes Intravenous Every 8 hours 09/27/23 2305 10/02/23 1106  Assessment/Plan: s/p Procedure(s): COLECTOMY WITH COLOSTOMY CREATION/HARTMANN PROCEDURE (N/A) [facility] diet Snf today POD 39 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 [name] - CT 10/24 with hematoma in the pelvis adjacent to small bowel anastomosis and ileus vs obstruction  - IR 11/1: drain placement for small bowel anastomotic leak/ abscess drainage - CT 11/8 with resolution in previous right hemipelvic collection and no leakage of enteric contrast - drain injection study 11/25 shows resolution of fistula and drain was removed. - reg diet, supplements; appreciate ongoing care from dietitian.  P.o. intake has been okay (860) - not enough but better than previous. Fistula has resolved so hopefully can increase PO intake and  avoid resumption of  TPN. - continue daily dressing changes  - Needs mobilization and physical conditioning -Wean off IV medication/narcotics.     FEN: reg diet -appreciate ongoing care from dietitian, we will see how her appetite is off the TPN Foley - removed. Has some urinary incontinence.  VTE: IVC Filter  ID: no current abx; topical antifungal to yeast dermatitis in groin area  Okay for transition to SNF when bed available   ABL anemia secondary to post-op hematomas and hematuria  Acute PE and LLE DVT Pancreatic tail cyst Thyroid nodule  Chronic HFpEF Paroxysmal A. Fib Pre-diabetes RLS   LOS: 43 days    Chevis Pretty III 11/09/2023

## 2023-11-09 NOTE — Discharge Summary (Signed)
Physician Discharge Summary   Patient: Elizabeth Odom MRN: [id number] DOB: 1941/05/21  Admit date:     09/27/2023  Discharge date: 11/09/23  Discharge Physician: Lynden Oxford  PCP: [name], FNP  Recommendations at discharge: Follow up with General surgery for wound recheck.  Follow up with PCP in 1 week  CBC BMP in 1 week.  Outpt referral to Palliative care.    Follow-up Information     Axel Filler, MD. Schedule an appointment as soon as possible for a visit in 3 week(s).   Specialty: General Surgery Why: For wound re-check Contact information: [address] [place] [address] [phone]         [name], FNP. Schedule an appointment as soon as possible for a visit in 1 week(s).   Specialty: Family Medicine Why: with BMP lab to look at kidney and electrolytes, with CBC lab to look at blood counts Contact information: [address] [place] [address] [phone]                Discharge Diagnoses: Principal Problem:   Large bowel obstruction [facility]) Active Problems:   Acute colitis   Left leg DVT (HCC)   ABLA (acute blood loss anemia)   Palliative care encounter   Counseling and coordination of care   Need for emotional support   Goals of care, counseling/discussion   High risk medication use   Pain   Medication management   Pressure injury of skin   Malnutrition of moderate degree   Iron deficiency anemia due to chronic blood loss   Acute pulmonary embolism (HCC)   Hyponatremia   Acute urinary retention  Brief hospital course: 82 year old with past medical history significant for A-fib, RLS, anemia presented to the ED 10/17 with recurrent abdominal pain, nausea emesis, diarrhea intolerance of oral intake. She was evaluated in the ED 10/10 for abdominal pain, decreased appetite, fever, diagnosed with possible colitis noted on CT abdomen and pelvis discharged on Augmentin. She had recurrence  of symptoms and presented back to the hospital. CT initially showed proximal sigmoid colon wall thickening with upstream colonic dilation with air-fluid levels and large amount of stool compatible with obstruction.   Assessment and Plan: Large bowel obstruction. SP Hartman's resection.  Colostomy creation. anastomotic leak with pelvic abscess requiring drain placement. Also had hematoma around the staple line. Postop ileus. Patient noted to have presented abdominal pain with CT abdomen and pelvis done 09/27/2023 with proximal sigmoid colon thickening. -Patient seen in consultation by GI underwent sigmoidoscopy 09/29/2023 which showed a tight stricture found to have a mass, biopsy was negative for malignancy suggesting inflammation. -General Surgery consulted  Underwent exploratory laparotomy Hartman's resection 10/01/2023 with small bowel resection and ostomy per Dr. [name]. -Repeat CT abdomen and pelvis 10/11/2023 with small leak at the anastomosis going into 6.7 cm fluid collection. -Patient seen in consultation by IR underwent drain catheter placement of pelvic abscess on 10/12/2023 with cultures growing MRSA. -Patient seen in consultation by ID and treated with daptomycin and Zosyn. -Repeat CT abdomen and pelvis 10/18/2023 with percutaneous pig  tail drain catheter with no persistent anastomotic leak or fistula. -10/22/2023 pelvic drain injection study showed fistulous communication with colon/staple line anastomosis with drain to remain in place. -Per ID no further indication for antibiotics and antibiotics subsequently discontinued. -Was on TPN which has been weaned off per general surgery recommendations. -Diet advanced to regular diet which patient seems to be tolerating. -Continue daily  dressings. -Mobilization. -Patient assessed by IR and patient underwent pelvic drain injection study and drain removal on 11/05/2023.   -Per general surgery. Outpt follow up recommended   Acute blood  loss anemia status post perianastomotic hematoma with hematuria -Status post transfusion 4 units PRBCs during the hospitalization. -Seen in consultation by urology no indication for cystoscopy or intervention at this time. Hemoglobin stable.   Acute PE (small to moderate clot burden) without right heart strain/acute left lower extremity DVT -Patient noted unable to tolerate anticoagulation due to surgicla site bleding and hematoma.  -Underwent IVC filter placement 10/05/2023. -2D echo with no evidence of right heart strain. -CT scan with no evidence of right heart strain. -Outpatient follow-up with PCP.  Hyponatremia -Was on D10 which was subsequently discontinued with improvement with hyponatremia. -Sodium fluctuating however seems to have stabilized currently at 132. -Follow.   Thyroid nodule in the inferior aspect of the isthmus -Outpatient follow-up.    Paroxysmal atrial fibrillation -Rate controlled.   -Not on anticoagulation due to bleeding.    Hypokalemia/hypophosphatemia/hypomagnesemia -Patient was on TPN which has been weaned off. Labs stable.    Urinary retention -Status post Foley catheter placement which has been discontinued..     Hypotension -Resolved.   Leukocytosis Resolved.   Moderate protein calorie malnutrition -TPN weaned off.  Diet advanced.  Continue supplements.    Pressure injury left  stage II/MASD Right buttock drain site ulcer Surgical site wound.  Continue current dressing changes.  Goals of care conversation. On 11/27 Patient tells me that she wants to stop all the therapies. Patient tells me that no one at family can help her out with care at home. Patient tells me that she does not want to go to SNF and does not want to do dressing changes and does not want to do with IV and blood work. Patient tells me that she is not depressed. Recommended patient to discuss with the family as she tells me that her family is not agreeable with her  current decision. She does not want any medication but she is very concerned with regards to infection in her sacrum. Explained in detail with regards to future prognosis on her current medical stability. Discussed with palliative care.    Further discussion with patient on 11/28.  Currently agreeable to go to SNF and work with physical therapy with ongoing plan for treatment although continues to refuse interventions and therapy that might be helpful for better pain control, diet improvement or appetite improvement. This was relayed to grand daughter as well.   Pain control - [employer] Controlled Substance Reporting System database was reviewed. and patient was instructed, not to drive, operate heavy machinery, perform activities at heights, swimming or participation in water activities or provide baby-sitting services while on Pain, Sleep and Anxiety Medications; until their outpatient Physician has advised to do so again. Also recommended to not to take more than prescribed Pain, Sleep and Anxiety Medications.  Consultants:  General surgery  Palliative care  IR ID Gastroenterology   Procedures performed:  Sigmoid COLECTOMY WITH COLOSTOMY CREATION/HARTMANN PROCEDURE (N/A) Small bowel resection with anastomosis 10/21  Flexible Sigmoidoscopy 10/19  Drain injection and removal  11/25  DRAINAGE CATHETER PLACEMENT into a PELVIC ABSCESS  11/01  IVC gram and IVC filter placement infrarenal, retrievable 10/25  DISCHARGE MEDICATION: Allergies as of 11/09/2023       Reactions   Elemental Sulfur Anaphylaxis        Medication List     STOP taking  these medications    amoxicillin-clavulanate 875-125 MG tablet Commonly known as: AUGMENTIN   Fish Oil 500 MG Caps   meclizine 25 MG tablet Commonly known as: ANTIVERT   ondansetron 4 MG disintegrating tablet Commonly known as: ZOFRAN-ODT       TAKE these medications    acetaminophen 500 MG tablet Commonly known as:  TYLENOL Take 2 tablets (1,000 mg total) by mouth every 6 (six) hours as needed for mild pain (pain score 1-3), moderate pain (pain score 4-6), fever or headache.   ascorbic acid 1000 MG tablet Commonly known as: VITAMIN C Take 1 tablet (1,000 mg total) by mouth daily. Start taking on: November 10, 2023   clotrimazole 1 % cream Commonly known as: LOTRIMIN Apply topically 2 (two) times daily.   docusate sodium 100 MG capsule Commonly known as: COLACE Take 1 capsule (100 mg total) by mouth 2 (two) times daily.   Gerhardt's butt cream Crea Apply 1 Application topically 3 (three) times daily.   multivitamin with minerals Tabs tablet Take 1 tablet by mouth daily. Start taking on: November 10, 2023   nutrition supplement (JUVEN) Pack Take 1 packet by mouth 2 (two) times daily between meals.   ondansetron 4 MG tablet Commonly known as: ZOFRAN Take 1 tablet (4 mg total) by mouth every 6 (six) hours as needed for nausea.   oxyCODONE 5 MG immediate release tablet Commonly known as: Oxy IR/ROXICODONE Take 1 tablet (5 mg total) by mouth every 4 (four) hours as needed for moderate pain (pain score 4-6) or severe pain (pain score 7-10).   polyethylene glycol 17 g packet Commonly known as: MIRALAX / GLYCOLAX Take 17 g by mouth daily as needed for mild constipation.   zinc sulfate (50mg  elemental zinc) 220 (50 Zn) MG capsule Take 1 capsule (220 mg total) by mouth daily. Start taking on: November 10, 2023               Discharge Care Instructions  (From admission, onward)           Start     Ordered   11/09/23 0000  Discharge wound care:       Comments: Right buttock: Cleanse drain site right upper buttock with saline, pat dry Tuck small strip of iodoform packing into drain site, top with foam. Change daily or PRN for soiling.  Left buttock: Xeroform to the left buttock, change daily or PRN for soiling. Anterior Abdominal surgical site: wet to dry dressing, change daily.    11/09/23 1116           Disposition: SNF Diet recommendation: Regular diet  Discharge Exam: Vitals:   11/08/23 1431 11/08/23 2152 11/08/23 2200 11/09/23 0348  BP: (!) 107/53 (!) 103/51  127/61  Pulse: 72 (!) 48 68 76  Resp:  16  16  Temp: 97.8 F (36.6 C) 97.8 F (36.6 C)  97.6 F (36.4 C)  TempSrc: Oral Oral  Oral  SpO2: 97% 100%  100%  Weight:      Height:       General: Appear in no distress; no visible Abnormal Neck Mass Or lumps, Conjunctiva normal Cardiovascular: S1 and S2 Present, no Murmur, Respiratory: good respiratory effort, Bilateral Air entry present and CTA, no Crackles, no wheezes Abdomen: Bowel Sound present, mild surgical tenderness Extremities: no Pedal edema Neurology: alert and oriented to time, place, and person  [facility] Weights   10/28/23 0753 10/31/23 0500 11/07/23 0631  Weight: 58.2 kg 59.9 kg 58.2 kg  Condition at discharge: stable  The results of significant diagnostics from this hospitalization (including imaging, microbiology, ancillary and laboratory) are listed below for reference.   Imaging Studies: IR Sinus/Fist Tube Chk-Non GI  Result Date: 11/05/2023 INDICATION: 82 year old with postoperative pelvic abscess. Previous drain injection demonstrated a bowel fistula. Minimal output from the drain and evaluate for a bowel fistula. EXAM: SINUS TRACT INJECTION / FISTULOGRAM COMPARISON:  None Available. MEDICATIONS: None ANESTHESIA/SEDATION: None COMPLICATIONS: None immediate. FLUOROSCOPY: Radiation Exposure Index (as provided by the fluoroscopic device): 31 mGy Kerma TECHNIQUE: Patient was placed in left lateral decubitus position on the interventional table. Scout image was obtained. PROCEDURE: Drain was injected with 6 mL Omnipaque 300. Following the injection, the drain was cut and removed without complication. Bandage placed at the old drain site. FINDINGS: Pigtail drain was in the posterior aspect of the pelvis. Minimal collection around the  drain itself. Majority of the contrast was draining around the tube towards the skin. No evidence for a bowel fistula. IMPRESSION: 1. No significant collection around the drain. No evidence for a bowel fistula. 2. Drain was removed. Electronically Signed   By: Richarda Overlie M.D.   On: 11/05/2023 10:35   DG Abd Portable 1V  Result Date: 10/26/2023 CLINICAL DATA:  Acute generalized abdominal pain and nausea. EXAM: PORTABLE ABDOMEN - 1 VIEW COMPARISON:  October 08, 2023. FINDINGS: Percutaneous drainage catheter is noted in right lower quadrant. No abnormal bowel dilatation is noted. Contrast is noted in nondilated colon. IMPRESSION: No abnormal bowel dilatation. Percutaneous drainage catheter noted in right lower quadrant. Electronically Signed   By: Lupita Raider M.D.   On: 10/26/2023 15:26   IR Sinus/Fist Tube Chk-Non GI  Result Date: 10/22/2023 CLINICAL DATA:  Pelvic abscess s/p drainage catheter placement 10/12/2023. EXAM: DRAIN INJECTION/FISTULOGRAM/ABSCESSOGRAM COMPARISON:  Pelvic XR, earlier same day. IR CT, 10/12/2023. CT AP, 10/19/2023 and 10/11/2023. CONTRAST:  10 mL Omnipaque 300-administered via the existing percutaneous drain. FLUOROSCOPY TIME:  Fluoroscopic dose; 20 mGy TECHNIQUE: The patient was positioned LEFT lateral decubitus on the fluoroscopy table. A preprocedural spot fluoroscopic image was obtained of the pelvis and the existing percutaneous drainage catheter. Multiple spot fluoroscopic and radiographic images were obtained following the injection of a small amount of contrast via the existing percutaneous drainage catheter. FINDINGS: *Similar positioning of RIGHT transgluteal-approach pelvic drainage catheter. *Drainage catheter contrast injection without significant residual pelvic collection. *Contrast opacification along the anastomotic staple line and adjacent colon. IMPRESSION: RIGHT pelvic drainage catheter with contrast opacification of the anastomotic staple line and adjacent  colon. Findings highly suspicious for fistulous communication with the colonic anastomosis Electronically Signed   By: Roanna Banning M.D.   On: 10/22/2023 15:55   DG Pelvis Portable  Result Date: 10/22/2023 CLINICAL DATA:  [id number] Pelvic abscess in female [id number] EXAM: PORTABLE PELVIS 1-2 VIEWS COMPARISON:  IR drain injection, concurrent. CT AP, 10/19/2023 and 10/11/2023. IR CT, 10/12/2023. FINDINGS: RIGHT pelvic drainage catheter with pigtail tip projecting near midline and lateral to the anastomotic staple line. Contrast opacification along the colon and line. Prior intraluminal contrast opacification of ascending and transverse colon. No interval osseous abnormality. IMPRESSION: RIGHT pelvic drainage catheter with contrast opacification of the colon and staple line. Findings highly suspicious for fistulous communication with anastomosis. Electronically Signed   By: Roanna Banning M.D.   On: 10/22/2023 11:21   CT ABDOMEN PELVIS W CONTRAST  Result Date: 10/19/2023 CLINICAL DATA:  Small bowel resection with anastomotic leak and fluid collection, status post percutaneous drain placement, history  of colon cancer status post resection * Tracking Code: BO * EXAM: CT ABDOMEN AND PELVIS WITH CONTRAST TECHNIQUE: Multidetector CT imaging of the abdomen and pelvis was performed using the standard protocol following bolus administration of intravenous contrast. RADIATION DOSE REDUCTION: This exam was performed according to the departmental dose-optimization program which includes automated exposure control, adjustment of the mA and/or kV according to patient size and/or use of iterative reconstruction technique. CONTRAST:  OMNIPAQUE IOHEXOL 300 MG/ML  SOLN COMPARISON:  10/11/2023 FINDINGS: Lower chest: Small bilateral pleural effusions and associated atelectasis or consolidation. Hepatobiliary: No solid liver abnormality is seen. No gallstones, gallbladder wall thickening, or biliary dilatation. Pancreas:  Unremarkable. No pancreatic ductal dilatation or surrounding inflammatory changes. Spleen: Normal in size without significant abnormality. Adrenals/Urinary Tract: Adrenal glands are unremarkable. Kidneys are normal, without renal calculi, solid lesion, or hydronephrosis. Foley catheter in the bladder. Stomach/Bowel: Stomach is within normal limits. Status post Gertie Gowda procedure sigmoid colon resection with left lower quadrant end colostomy and rectal stump. Descending colonic diverticulosis. Vascular/Lymphatic: Aortic atherosclerosis. Infrarenal IVC filter. No enlarged abdominal or pelvic lymph nodes. Reproductive: Status post hysterectomy. Other: Status post midline laparotomy. Interval placement of a right posterior approach percutaneous pigtail drain catheter within the low right hemipelvis, with resolution of a previously seen air and fluid collection in this vicinity (series 2, image 63). There is an adjacent small bowel anastomosis containing air and fluid but without enteric contrast (series 2, image 63). Musculoskeletal: No acute or significant osseous findings. IMPRESSION: 1. Interval placement of a right posterior approach percutaneous pigtail drain catheter within the low right hemipelvis, with resolution of a previously seen air and fluid collection in this vicinity. 2. Adjacent small bowel anastomosis containing air and fluid but without enteric contrast. No obvious evidence of persistent anastomotic leak or fistula. 3. Status post midline laparotomy and sigmoid colon resection with left lower quadrant end colostomy and rectal stump. 4. Small bilateral pleural effusions and associated atelectasis or consolidation. 5. Infrarenal IVC filter. Aortic Atherosclerosis (ICD10-I70.0). Electronically Signed   By: Jearld Lesch M.D.   On: 10/19/2023 14:00   CT GUIDED VISCERAL FLUID DRAIN BY PERC CATH  Result Date: 10/12/2023 INDICATION: [id number] Abscess [id number] EXAM: CT-GUIDED PELVIC ABSCESS DRAINAGE CATHETER  PLACEMENT COMPARISON:  CT AP, 10/11/2023 MEDICATIONS: The patient is currently admitted to the hospital and receiving intravenous antibiotics. The antibiotics were administered within an appropriate time frame prior to the initiation of the procedure. ANESTHESIA/SEDATION: Moderate (conscious) sedation was employed during this procedure. A total of Versed 1.5 mg and Fentanyl 75 mcg was administered intravenously. Moderate Sedation Time: 25 minutes. The patient's level of consciousness and vital signs were monitored continuously by radiology nursing throughout the procedure under my direct supervision. CONTRAST:  None FLUOROSCOPY TIME:  CT dose; 948 mGycm COMPLICATIONS: None immediate. PROCEDURE: RADIATION DOSE REDUCTION: This exam was performed according to the departmental dose-optimization program which includes automated exposure control, adjustment of the mA and/or kV according to patient size and/or use of iterative reconstruction technique. Informed written consent was obtained from the patient and/or patient's representative after a discussion of the risks, benefits and alternatives to treatment. The patient was placed prone on the CT gantry and a pre procedural CT was performed re-demonstrating the known abscess/fluid collection within the pelvis. The procedure was planned. A timeout was performed prior to the initiation of the procedure. The RIGHT gluteus was prepped and draped in the usual sterile fashion. The overlying soft tissues were anesthetized with 1% lidocaine with epinephrine. Appropriate  trajectory was planned with the use of a 22 gauge spinal needle. An 18 gauge trocar needle was advanced into the abscess/fluid collection and a short Amplatz super stiff wire was coiled within the collection. Appropriate positioning was confirmed with a limited CT scan. The tract was serially dilated allowing placement of a 12 Fr drainage catheter. Appropriate positioning was confirmed with a limited  postprocedural CT scan. 10 mL of serosanguineous fluid was aspirated. The tube was connected to a drainage bag and sutured in place. A dressing was placed. The patient tolerated the procedure well without immediate post procedural complication. IMPRESSION: Successful CT guided placement of a 12 Fr percutaneous drain catheter into the pelvic abscess via a RIGHT transgluteal approach Aspiration of 10 mL of serous sanguinous fluid. Samples were sent to the laboratory as requested by the ordering clinical team. Roanna Banning, MD [facility] Electronically Signed   By: Roanna Banning M.D.   On: 10/12/2023 17:20   [place] EKG SITE RITE  Result Date: 10/11/2023 If Site Rite image not attached, placement could not be confirmed due to current cardiac rhythm.  CT ABDOMEN PELVIS W CONTRAST  Result Date: 10/11/2023 CLINICAL DATA:  Postoperative abdominal pain. EXAM: CT ABDOMEN AND PELVIS WITH CONTRAST TECHNIQUE: Multidetector CT imaging of the abdomen and pelvis was performed using the standard protocol following bolus administration of intravenous contrast. RADIATION DOSE REDUCTION: This exam was performed according to the departmental dose-optimization program which includes automated exposure control, adjustment of the mA and/or kV according to patient size and/or use of iterative reconstruction technique. CONTRAST:  OMNIPAQUE IOHEXOL 300 MG/ML  SOLN COMPARISON:  October 04, 2023. FINDINGS: Lower chest: Mild bilateral posterior basilar subsegmental atelectasis is noted. Hepatobiliary: No focal liver abnormality is seen. No gallstones, gallbladder wall thickening, or biliary dilatation. Pancreas: Unremarkable. No pancreatic ductal dilatation or surrounding inflammatory changes. Spleen: Normal in size without focal abnormality. Adrenals/Urinary Tract: Adrenal glands appear normal. Stable right renal cyst for which no further follow-up is required. Small left  renal calculus. No hydronephrosis or renal obstruction is noted. Urinary bladder is unremarkable. Stomach/Bowel: Nasogastric tube tip is seen in stomach. Colostomy is noted in left lower quadrant with Hartmann's pouch. Status post partial small bowel obstruction and partial left colectomy. No significant bowel dilatation is noted. Small bowel surgical anastomosis is noted in the pelvis. There appears to be extravasation of contrast in this area suggesting possible bowel perforation. This is best seen on image number 60 of series 7. Contrast appears to be extending into posterior pelvic complex fluid collection which measures 7 x 6 cm. There is high density material within this suggesting clot. Vascular/Lymphatic: Aortic atherosclerosis. No enlarged abdominal or pelvic lymph nodes. IVC filter is noted in infrarenal position. Reproductive: Status post hysterectomy. No adnexal masses. Other: Midline surgical incision is noted anteriorly in the pelvis. No definite hernia is noted. Musculoskeletal: No acute or significant osseous findings. IMPRESSION: Extensive postsurgical changes are noted, with colostomy seen in left lower quadrant as well as Hartmann's pouch. Status post partial left colectomy. Small bowel anastomotic site is noted in the pelvis, and there does appear to be extravasation of contrast in this area suggesting perforation or leakage. Contrast is seen extending into 7 x 6 cm complex fluid collection posteriorly in the pelvis which appears to contain some degree of clot as well. These results will be called to the ordering clinician or representative by the Radiologist Assistant, and communication documented in the PACS or zVision Dashboard.  Aortic Atherosclerosis (ICD10-I70.0). Electronically Signed   By: Lupita Raider M.D.   On: 10/11/2023 11:44    Microbiology: Results for orders placed or performed during the hospital encounter of 09/27/23  Surgical pcr screen     Status: Abnormal   Collection  Time: 10/01/23  4:19 AM   Specimen: Nasal Mucosa; Nasal Swab  Result Value Ref Range Status   MRSA, PCR POSITIVE (A) NEGATIVE Final    Comment: RESULT CALLED TO, READ BACK BY AND VERIFIED WITH: NGUYEN, T. RN AT [phone] ON 10/01/2023 BY MECIAL J.    Staphylococcus aureus POSITIVE (A) NEGATIVE Final    Comment: (NOTE) The Xpert SA Assay (FDA approved for NASAL specimens in patients 33 years of age and older), is one component of a comprehensive surveillance program. It is not intended to diagnose infection nor to guide or monitor treatment. Performed at [facility], [address], [place], [place] [address]   Aerobic/Anaerobic Culture w Gram Stain (surgical/deep wound)     Status: None   Collection Time: 10/12/23  3:44 PM   Specimen: Abscess  Result Value Ref Range Status   Specimen Description   Final    ABSCESS Performed at [facility], [address], [place], [place] [address]    Special Requests   Final    NONE Performed at [facility], [address] [place], [place] [address]    Gram Stain   Final    MODERATE WBC PRESENT, PREDOMINANTLY PMN MODERATE GRAM POSITIVE COCCI FEW GRAM NEGATIVE RODS    Culture   Final    ABUNDANT METHICILLIN RESISTANT STAPHYLOCOCCUS AUREUS NO ANAEROBES ISOLATED Sent to [facility] for further susceptibility testing. SEE SEPARATE REPORT Performed at [facility], [address] [place], [place] [address]    Report Status 10/20/2023 FINAL  Final   Organism ID, Bacteria METHICILLIN RESISTANT STAPHYLOCOCCUS AUREUS  Final      Susceptibility   Methicillin resistant staphylococcus aureus - MIC*    CIPROFLOXACIN >=8 RESISTANT Resistant     ERYTHROMYCIN >=8 RESISTANT Resistant     GENTAMICIN <=0.5 SENSITIVE Sensitive     OXACILLIN >=4 RESISTANT Resistant     TETRACYCLINE <=1 SENSITIVE Sensitive     VANCOMYCIN 1 SENSITIVE Sensitive     TRIMETH/SULFA >=320 RESISTANT Resistant      CLINDAMYCIN <=0.25 SENSITIVE Sensitive     RIFAMPIN <=0.5 SENSITIVE Sensitive     Inducible Clindamycin NEGATIVE Sensitive     LINEZOLID 2 SENSITIVE Sensitive     * ABUNDANT METHICILLIN RESISTANT STAPHYLOCOCCUS AUREUS  Minimum Inhibitory Conc. (1 Drug)     Status: Abnormal   Collection Time: 10/12/23 11:26 PM  Result Value Ref Range Status   Min Inhibitory Conc (1 Drug) Final report (A)  Corrected    Comment: (NOTE) Performed At: [facility] [place] [address] [place], [place] [id number] Jolene Schimke MD IO:[id number] CORRECTED ON 11/09 AT 0637: PREVIOUSLY REPORTED AS Preliminary report    Source CRE PENDING  Incomplete   Labs: CBC: Recent Labs  Lab 11/03/23 0306 11/04/23 0320 11/05/23 0315 11/06/23 0350 11/07/23 0410  WBC 8.0 8.3 7.4 6.4 5.9  HGB 11.0* 10.7* 11.4* 10.7* 10.1*  HCT 34.5* 33.3* 34.2* 33.7* 32.0*  MCV 97.2 96.0 95.0 96.8 97.0  PLT 192 199 247 275 264   Basic Metabolic Panel: Recent Labs  Lab 11/03/23 0306 11/04/23 0320 11/05/23 0315 11/06/23 0350 11/07/23 0410  NA 128* 128* 132* 132* 135  K 3.9 3.5 3.6  3.6 3.6  CL 97* 97* 97* 98 101  CO2 25 25 25 25 26   GLUCOSE 109* 109* 124* 120* 113*  BUN 29* 23 27* 33* 25*  CREATININE 0.70 0.62 0.69 0.87 0.85  CALCIUM 9.1 8.9 9.1 9.1 9.2  MG 1.9 1.7 2.2  --  1.8  PHOS 4.3  --  3.4  --  3.2   Liver Function Tests: Recent Labs  Lab 11/03/23 0306 11/07/23 0410  AST 38  --   ALT 59*  --   ALKPHOS 136*  --   BILITOT 1.2*  --   PROT 7.9  --   ALBUMIN 2.5* 2.4*   CBG: No results for input(s): "GLUCAP" in the last 168 hours.  Discharge time spent: greater than 30 minutes.  Author: Lynden Oxford, MD  [facility]

## 2023-11-09 NOTE — Progress Notes (Signed)
LA DL PICC removed per protocol per MD order. Manual pressure applied for 4 mins. Vaseline gauze, gauze, and Tegaderm applied over insertion site. No bleeding or swelling noted. Instructed patient to remain in bed for thirty mins. Educated patient about S/S of infection and when to call MD.

## 2023-11-09 NOTE — Progress Notes (Signed)
Physical Therapy Treatment Patient Details Name: Elizabeth Odom MRN: [id number] DOB: 03-13-1941 Today's Date: 11/09/2023   History of Present Illness Patient is a 82 year old female who presented on 10/17 with acute colitis. on10/21, patient underwent ex laparotomy with hartman's resection and colostomy and small bowel resection. PMH: a fib, RLS, syncope, cervical CA, cervical spine fusions, anemia    PT Comments  Pt was seen in bed at start of treatment. Pt was very anxious throughout session. Pt performed supine to sit with max assist to scoot to EOB. Pt felt "fuzzy" when sitting, so BP was taken: 140/62. Pt performed sit to stand with elevated bed and min assist for physical assistance and cueing to push off bed. Pt ambulated to bathroom with a flexed posture, knee flexion, and short steps. Pt required cueing when standing from commode to push from handles. Pt unsteadily ambulated back to bed with increased knee and trunk flexion, she required direction to the bed and encouragement to keep walking. Pt was repositioned in bed with max assist + 2 at end of session. Pt is a high fall risk from weakness, decreased problem solving, and fear. LPT recommends SNF.   If plan is discharge home, recommend the following: A lot of help with walking and/or transfers;A lot of help with bathing/dressing/bathroom;Assistance with cooking/housework;Help with stairs or ramp for entrance;Two people to help with walking and/or transfers   Can travel by private vehicle     No  Equipment Recommendations  None recommended by PT    Recommendations for Other Services OT consult     Precautions / Restrictions Precautions Precautions: Fall Precaution Comments: colostomy, sacral wound, abd  surgical wound Restrictions Weight Bearing Restrictions: No Other Position/Activity Restrictions: abdominal surgery, colostomy     Mobility  Bed Mobility Overal bed mobility: Needs Assistance       Supine to sit: +2  for safety/equipment, HOB elevated, +2 for physical assistance, Mod assist Sit to supine: Max assist, +2 for physical assistance   General bed mobility comments: Pt required assistance to scoot to EOB, flexed posture when sitting. Patient Response: Anxious, Restless, Flat affect  Transfers Overall transfer level: Needs assistance Equipment used: Rolling walker (2 wheels) Transfers: Sit to/from Stand Sit to Stand: Min assist, From elevated surface           General transfer comment: Bed elevated, very slow, weak. Cues to push up from bed/handles in bathroom.    Ambulation/Gait Ambulation/Gait assistance: Mod assist Gait Distance (Feet): 16 Feet Assistive device: Rolling walker (2 wheels) Gait Pattern/deviations: Decreased stride length, Trunk flexed, Narrow base of support, Knee flexed in stance - right, Knee flexed in stance - left, Decreased dorsiflexion - right, Decreased dorsiflexion - left, Step-through pattern Gait velocity: decreased     General Gait Details: increased trunk extension and increased knee flexion when ambulating from bathroom, cues to stand tall. Quick to fatigue, unsteady.   Stairs             Wheelchair Mobility     Tilt Bed Tilt Bed Patient Response: Anxious, Restless, Flat affect  Modified Rankin (Stroke Patients Only)       Balance                                            Cognition Arousal: Alert Behavior During Therapy: Flat affect Overall Cognitive Status: Within Functional Limits for tasks assessed  General Comments: Pt is very anxious, scared of falling        Exercises      General Comments        Pertinent Vitals/Pain Pain Assessment Faces Pain Scale: Hurts even more Pain Location: buttocks/abdomen Pain Intervention(s): Monitored during session, Repositioned    Home Living                          Prior Function            PT  Goals (current goals can now be found in the care plan section) Acute Rehab PT Goals 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[employer] PT Goal Formulation: With patient Time For Goal Achievement: 11/14/23 Progress towards PT goals: Progressing toward goals    Frequency    Min 1X/week      PT Plan      Co-evaluation              AM-PAC PT "6 Clicks" Mobility   Outcome Measure  Help needed turning from your back to your side while in a flat bed without using bedrails?: A Lot Help needed moving from lying on your back to sitting on the side of a flat bed without using bedrails?: A Lot Help needed moving to and from a bed to a chair (including a wheelchair)?: A Lot Help needed standing up from a chair using your arms (e.g., wheelchair or bedside chair)?: A Lot Help needed to walk in hospital room?: A Lot Help needed climbing 3-5 steps with a railing? : Total 6 Click Score: 11    End of Session Equipment Utilized During Treatment: Gait belt Activity Tolerance: Patient limited by fatigue;Patient limited by pain Patient left: in chair;with call bell/phone within reach Nurse Communication: Mobility status PT Visit Diagnosis: Difficulty in walking, not elsewhere classified (R26.2);Muscle weakness (generalized) (M62.81);Unsteadiness on feet (R26.81);Pain     Time: 1023-1050 PT Time Calculation (min) (ACUTE ONLY): 27 min  Charges:    $Gait Training: 8-22 mins $Therapeutic Activity: 8-22 mins             Lazaro Arms, SPTA 11/09/2023, 10:59 AM

## 2023-11-09 NOTE — TOC Transition Note (Addendum)
Transition of Care [facility]) - CM/SW Discharge Note   Patient Details  Name: Elizabeth Odom MRN: [id number] Date of Birth: October 07, 1941  [facility] CM/SW Contact:  Adrian Prows, RN Phone Number: 11/09/2023, 12:30 PM   Clinical Narrative:    Called by pt's grand daughter Elizabeth Odom; she has selected [facility]; notified [place] in Admissions at facility; she will call back w/ RM # and call report #; d/c summary and SNF transfer report sent via SNF hub; [facility] for transport.  -[phone]- notified by [place], Admissions at facility pt given RM # [id number], and call report # [phone]; PT[facility] called at [phone]; spoke w/ [place]; no TOC needs.   Final next level of care: Skilled Nursing Facility Barriers to Discharge: No Barriers Identified   Patient Goals and CMS Choice CMS Medicare.gov Compare Post Acute Care list provided to:: Patient Choice offered to / list presented to : Patient  Discharge Placement                Patient [facility] bed at: [facility] Patient to be transferred to facility by: [facility] Name of family member notified: (S) Elizabeth Odom (grand daughter) [phone] Patient and family notified of of transfer: 11/09/23  Discharge Plan and Services Additional resources added to the After Visit Summary for   In-house Referral: NA Discharge Planning Services: CM Consult Post Acute Care Choice: Home Health          DME Arranged: N/A DME Agency: NA       HH Arranged: PT, RN HH Agency: [facility] Date HH Agency Contacted: 10/02/23 Time HH Agency Contacted: 1550 Representative spoke with at [facility] Agency: Morrie Sheldon  Social Determinants of Health (SDOH) Interventions SDOH Screenings   Food Insecurity: No Food Insecurity (09/28/2023)  Housing: Low Risk  (09/28/2023)  Transportation Needs: No Transportation Needs (09/28/2023)  Utilities: Not At Risk (09/28/2023)  Tobacco Use: Low Risk  (10/04/2023)     Readmission Risk  Interventions    10/19/2023    6:13 PM  Readmission Risk Prevention Plan  Transportation Screening Complete  PCP or Specialist Appt within 5-7 Days Complete  Home Care Screening Complete  Medication Review (RN CM) Complete

## 2023-11-11 DIAGNOSIS — E440 Moderate protein-calorie malnutrition: Secondary | ICD-10-CM | POA: Diagnosis not present

## 2023-11-11 DIAGNOSIS — M6259 Muscle wasting and atrophy, not elsewhere classified, multiple sites: Secondary | ICD-10-CM | POA: Diagnosis not present

## 2023-11-11 DIAGNOSIS — Z48815 Encounter for surgical aftercare following surgery on the digestive system: Secondary | ICD-10-CM | POA: Diagnosis not present

## 2023-11-11 DIAGNOSIS — R278 Other lack of coordination: Secondary | ICD-10-CM | POA: Diagnosis not present

## 2023-11-11 DIAGNOSIS — K9400 Colostomy complication, unspecified: Secondary | ICD-10-CM | POA: Diagnosis not present

## 2023-11-12 DIAGNOSIS — K56699 Other intestinal obstruction unspecified as to partial versus complete obstruction: Secondary | ICD-10-CM | POA: Diagnosis not present

## 2023-11-12 DIAGNOSIS — K529 Noninfective gastroenteritis and colitis, unspecified: Secondary | ICD-10-CM | POA: Diagnosis not present

## 2023-11-12 DIAGNOSIS — D62 Acute posthemorrhagic anemia: Secondary | ICD-10-CM | POA: Diagnosis not present

## 2023-11-12 DIAGNOSIS — E871 Hypo-osmolality and hyponatremia: Secondary | ICD-10-CM | POA: Diagnosis not present

## 2023-11-12 DIAGNOSIS — I82402 Acute embolism and thrombosis of unspecified deep veins of left lower extremity: Secondary | ICD-10-CM | POA: Diagnosis not present

## 2023-11-12 DIAGNOSIS — Z933 Colostomy status: Secondary | ICD-10-CM | POA: Diagnosis not present

## 2023-11-12 DIAGNOSIS — I2699 Other pulmonary embolism without acute cor pulmonale: Secondary | ICD-10-CM | POA: Diagnosis not present

## 2023-11-12 DIAGNOSIS — A4902 Methicillin resistant Staphylococcus aureus infection, unspecified site: Secondary | ICD-10-CM | POA: Diagnosis not present

## 2023-11-12 DIAGNOSIS — Z95828 Presence of other vascular implants and grafts: Secondary | ICD-10-CM | POA: Diagnosis not present

## 2023-11-13 DIAGNOSIS — T8189XA Other complications of procedures, not elsewhere classified, initial encounter: Secondary | ICD-10-CM | POA: Diagnosis not present

## 2023-11-13 DIAGNOSIS — L988 Other specified disorders of the skin and subcutaneous tissue: Secondary | ICD-10-CM | POA: Diagnosis not present

## 2023-11-14 DIAGNOSIS — I82402 Acute embolism and thrombosis of unspecified deep veins of left lower extremity: Secondary | ICD-10-CM | POA: Diagnosis not present

## 2023-11-14 DIAGNOSIS — E871 Hypo-osmolality and hyponatremia: Secondary | ICD-10-CM | POA: Diagnosis not present

## 2023-11-14 DIAGNOSIS — I2699 Other pulmonary embolism without acute cor pulmonale: Secondary | ICD-10-CM | POA: Diagnosis not present

## 2023-11-14 DIAGNOSIS — K56699 Other intestinal obstruction unspecified as to partial versus complete obstruction: Secondary | ICD-10-CM | POA: Diagnosis not present

## 2023-11-14 DIAGNOSIS — A4902 Methicillin resistant Staphylococcus aureus infection, unspecified site: Secondary | ICD-10-CM | POA: Diagnosis not present

## 2023-11-14 DIAGNOSIS — D62 Acute posthemorrhagic anemia: Secondary | ICD-10-CM | POA: Diagnosis not present

## 2023-11-14 DIAGNOSIS — Z933 Colostomy status: Secondary | ICD-10-CM | POA: Diagnosis not present

## 2023-11-14 DIAGNOSIS — Z95828 Presence of other vascular implants and grafts: Secondary | ICD-10-CM | POA: Diagnosis not present

## 2023-11-14 DIAGNOSIS — K529 Noninfective gastroenteritis and colitis, unspecified: Secondary | ICD-10-CM | POA: Diagnosis not present

## 2023-11-15 DIAGNOSIS — Z933 Colostomy status: Secondary | ICD-10-CM | POA: Diagnosis not present

## 2023-11-15 DIAGNOSIS — Z95828 Presence of other vascular implants and grafts: Secondary | ICD-10-CM | POA: Diagnosis not present

## 2023-11-15 DIAGNOSIS — I82402 Acute embolism and thrombosis of unspecified deep veins of left lower extremity: Secondary | ICD-10-CM | POA: Diagnosis not present

## 2023-11-15 DIAGNOSIS — K529 Noninfective gastroenteritis and colitis, unspecified: Secondary | ICD-10-CM | POA: Diagnosis not present

## 2023-11-15 DIAGNOSIS — I2699 Other pulmonary embolism without acute cor pulmonale: Secondary | ICD-10-CM | POA: Diagnosis not present

## 2023-11-15 DIAGNOSIS — K56699 Other intestinal obstruction unspecified as to partial versus complete obstruction: Secondary | ICD-10-CM | POA: Diagnosis not present

## 2023-11-15 DIAGNOSIS — D62 Acute posthemorrhagic anemia: Secondary | ICD-10-CM | POA: Diagnosis not present

## 2023-11-15 DIAGNOSIS — A4902 Methicillin resistant Staphylococcus aureus infection, unspecified site: Secondary | ICD-10-CM | POA: Diagnosis not present

## 2023-11-15 DIAGNOSIS — E871 Hypo-osmolality and hyponatremia: Secondary | ICD-10-CM | POA: Diagnosis not present

## 2023-11-16 DIAGNOSIS — K529 Noninfective gastroenteritis and colitis, unspecified: Secondary | ICD-10-CM | POA: Diagnosis not present

## 2023-11-16 DIAGNOSIS — I82402 Acute embolism and thrombosis of unspecified deep veins of left lower extremity: Secondary | ICD-10-CM | POA: Diagnosis not present

## 2023-11-16 DIAGNOSIS — Z95828 Presence of other vascular implants and grafts: Secondary | ICD-10-CM | POA: Diagnosis not present

## 2023-11-16 DIAGNOSIS — I2699 Other pulmonary embolism without acute cor pulmonale: Secondary | ICD-10-CM | POA: Diagnosis not present

## 2023-11-16 DIAGNOSIS — D62 Acute posthemorrhagic anemia: Secondary | ICD-10-CM | POA: Diagnosis not present

## 2023-11-16 DIAGNOSIS — E871 Hypo-osmolality and hyponatremia: Secondary | ICD-10-CM | POA: Diagnosis not present

## 2023-11-16 DIAGNOSIS — A4902 Methicillin resistant Staphylococcus aureus infection, unspecified site: Secondary | ICD-10-CM | POA: Diagnosis not present

## 2023-11-16 DIAGNOSIS — K56699 Other intestinal obstruction unspecified as to partial versus complete obstruction: Secondary | ICD-10-CM | POA: Diagnosis not present

## 2023-11-16 DIAGNOSIS — Z933 Colostomy status: Secondary | ICD-10-CM | POA: Diagnosis not present

## 2023-11-19 DIAGNOSIS — K56699 Other intestinal obstruction unspecified as to partial versus complete obstruction: Secondary | ICD-10-CM | POA: Diagnosis not present

## 2023-11-19 DIAGNOSIS — Z95828 Presence of other vascular implants and grafts: Secondary | ICD-10-CM | POA: Diagnosis not present

## 2023-11-19 DIAGNOSIS — K529 Noninfective gastroenteritis and colitis, unspecified: Secondary | ICD-10-CM | POA: Diagnosis not present

## 2023-11-19 DIAGNOSIS — E871 Hypo-osmolality and hyponatremia: Secondary | ICD-10-CM | POA: Diagnosis not present

## 2023-11-19 DIAGNOSIS — D62 Acute posthemorrhagic anemia: Secondary | ICD-10-CM | POA: Diagnosis not present

## 2023-11-19 DIAGNOSIS — Z933 Colostomy status: Secondary | ICD-10-CM | POA: Diagnosis not present

## 2023-11-19 DIAGNOSIS — I2699 Other pulmonary embolism without acute cor pulmonale: Secondary | ICD-10-CM | POA: Diagnosis not present

## 2023-11-19 DIAGNOSIS — I82402 Acute embolism and thrombosis of unspecified deep veins of left lower extremity: Secondary | ICD-10-CM | POA: Diagnosis not present

## 2023-11-19 DIAGNOSIS — A4902 Methicillin resistant Staphylococcus aureus infection, unspecified site: Secondary | ICD-10-CM | POA: Diagnosis not present

## 2023-11-20 DIAGNOSIS — T8189XA Other complications of procedures, not elsewhere classified, initial encounter: Secondary | ICD-10-CM | POA: Diagnosis not present

## 2023-11-20 DIAGNOSIS — L988 Other specified disorders of the skin and subcutaneous tissue: Secondary | ICD-10-CM | POA: Diagnosis not present

## 2023-11-21 DIAGNOSIS — K56699 Other intestinal obstruction unspecified as to partial versus complete obstruction: Secondary | ICD-10-CM | POA: Diagnosis not present

## 2023-11-21 DIAGNOSIS — D62 Acute posthemorrhagic anemia: Secondary | ICD-10-CM | POA: Diagnosis not present

## 2023-11-21 DIAGNOSIS — Z95828 Presence of other vascular implants and grafts: Secondary | ICD-10-CM | POA: Diagnosis not present

## 2023-11-21 DIAGNOSIS — E871 Hypo-osmolality and hyponatremia: Secondary | ICD-10-CM | POA: Diagnosis not present

## 2023-11-21 DIAGNOSIS — I82402 Acute embolism and thrombosis of unspecified deep veins of left lower extremity: Secondary | ICD-10-CM | POA: Diagnosis not present

## 2023-11-21 DIAGNOSIS — I2699 Other pulmonary embolism without acute cor pulmonale: Secondary | ICD-10-CM | POA: Diagnosis not present

## 2023-11-21 DIAGNOSIS — K529 Noninfective gastroenteritis and colitis, unspecified: Secondary | ICD-10-CM | POA: Diagnosis not present

## 2023-11-21 DIAGNOSIS — A4902 Methicillin resistant Staphylococcus aureus infection, unspecified site: Secondary | ICD-10-CM | POA: Diagnosis not present

## 2023-11-21 DIAGNOSIS — Z933 Colostomy status: Secondary | ICD-10-CM | POA: Diagnosis not present

## 2023-11-23 DIAGNOSIS — K529 Noninfective gastroenteritis and colitis, unspecified: Secondary | ICD-10-CM | POA: Diagnosis not present

## 2023-11-23 DIAGNOSIS — A4902 Methicillin resistant Staphylococcus aureus infection, unspecified site: Secondary | ICD-10-CM | POA: Diagnosis not present

## 2023-11-23 DIAGNOSIS — I2699 Other pulmonary embolism without acute cor pulmonale: Secondary | ICD-10-CM | POA: Diagnosis not present

## 2023-11-23 DIAGNOSIS — D62 Acute posthemorrhagic anemia: Secondary | ICD-10-CM | POA: Diagnosis not present

## 2023-11-23 DIAGNOSIS — M79652 Pain in left thigh: Secondary | ICD-10-CM | POA: Diagnosis not present

## 2023-11-23 DIAGNOSIS — K56699 Other intestinal obstruction unspecified as to partial versus complete obstruction: Secondary | ICD-10-CM | POA: Diagnosis not present

## 2023-11-23 DIAGNOSIS — I82402 Acute embolism and thrombosis of unspecified deep veins of left lower extremity: Secondary | ICD-10-CM | POA: Diagnosis not present

## 2023-11-23 DIAGNOSIS — Z933 Colostomy status: Secondary | ICD-10-CM | POA: Diagnosis not present

## 2023-11-23 DIAGNOSIS — E871 Hypo-osmolality and hyponatremia: Secondary | ICD-10-CM | POA: Diagnosis not present

## 2023-11-23 DIAGNOSIS — Z95828 Presence of other vascular implants and grafts: Secondary | ICD-10-CM | POA: Diagnosis not present

## 2023-11-26 ENCOUNTER — Encounter (HOSPITAL_COMMUNITY): Payer: Self-pay | Admitting: General Surgery

## 2023-11-27 DIAGNOSIS — Z933 Colostomy status: Secondary | ICD-10-CM | POA: Diagnosis not present

## 2023-11-27 DIAGNOSIS — K529 Noninfective gastroenteritis and colitis, unspecified: Secondary | ICD-10-CM | POA: Diagnosis not present

## 2023-11-27 DIAGNOSIS — K56699 Other intestinal obstruction unspecified as to partial versus complete obstruction: Secondary | ICD-10-CM | POA: Diagnosis not present

## 2023-11-27 DIAGNOSIS — Z95828 Presence of other vascular implants and grafts: Secondary | ICD-10-CM | POA: Diagnosis not present

## 2023-11-27 DIAGNOSIS — T8189XA Other complications of procedures, not elsewhere classified, initial encounter: Secondary | ICD-10-CM | POA: Diagnosis not present

## 2023-11-27 DIAGNOSIS — I82402 Acute embolism and thrombosis of unspecified deep veins of left lower extremity: Secondary | ICD-10-CM | POA: Diagnosis not present

## 2023-11-27 DIAGNOSIS — E871 Hypo-osmolality and hyponatremia: Secondary | ICD-10-CM | POA: Diagnosis not present

## 2023-11-27 DIAGNOSIS — D62 Acute posthemorrhagic anemia: Secondary | ICD-10-CM | POA: Diagnosis not present

## 2023-11-27 DIAGNOSIS — M79652 Pain in left thigh: Secondary | ICD-10-CM | POA: Diagnosis not present

## 2023-11-27 DIAGNOSIS — I2699 Other pulmonary embolism without acute cor pulmonale: Secondary | ICD-10-CM | POA: Diagnosis not present

## 2023-11-27 DIAGNOSIS — A4902 Methicillin resistant Staphylococcus aureus infection, unspecified site: Secondary | ICD-10-CM | POA: Diagnosis not present

## 2023-11-27 DIAGNOSIS — L988 Other specified disorders of the skin and subcutaneous tissue: Secondary | ICD-10-CM | POA: Diagnosis not present

## 2023-11-28 DIAGNOSIS — K631 Perforation of intestine (nontraumatic): Secondary | ICD-10-CM | POA: Diagnosis not present

## 2023-11-28 DIAGNOSIS — Z95828 Presence of other vascular implants and grafts: Secondary | ICD-10-CM | POA: Diagnosis not present

## 2023-11-28 DIAGNOSIS — I4891 Unspecified atrial fibrillation: Secondary | ICD-10-CM | POA: Diagnosis not present

## 2023-11-28 DIAGNOSIS — K56699 Other intestinal obstruction unspecified as to partial versus complete obstruction: Secondary | ICD-10-CM | POA: Diagnosis not present

## 2023-11-28 DIAGNOSIS — I82409 Acute embolism and thrombosis of unspecified deep veins of unspecified lower extremity: Secondary | ICD-10-CM | POA: Diagnosis not present

## 2023-11-28 DIAGNOSIS — G2581 Restless legs syndrome: Secondary | ICD-10-CM | POA: Diagnosis not present

## 2023-11-28 DIAGNOSIS — E871 Hypo-osmolality and hyponatremia: Secondary | ICD-10-CM | POA: Diagnosis not present

## 2023-11-28 DIAGNOSIS — Z433 Encounter for attention to colostomy: Secondary | ICD-10-CM | POA: Diagnosis not present

## 2023-11-28 DIAGNOSIS — E46 Unspecified protein-calorie malnutrition: Secondary | ICD-10-CM | POA: Diagnosis not present

## 2023-11-28 DIAGNOSIS — K529 Noninfective gastroenteritis and colitis, unspecified: Secondary | ICD-10-CM | POA: Diagnosis not present

## 2023-11-28 DIAGNOSIS — D62 Acute posthemorrhagic anemia: Secondary | ICD-10-CM | POA: Diagnosis not present

## 2023-11-28 DIAGNOSIS — I2699 Other pulmonary embolism without acute cor pulmonale: Secondary | ICD-10-CM | POA: Diagnosis not present

## 2023-11-28 DIAGNOSIS — A4902 Methicillin resistant Staphylococcus aureus infection, unspecified site: Secondary | ICD-10-CM | POA: Diagnosis not present

## 2023-12-19 ENCOUNTER — Telehealth: Payer: Self-pay | Admitting: Gastroenterology

## 2023-12-19 NOTE — Telephone Encounter (Signed)
 Hi Dr. Charlanne,  We received a referral for patient to be evaluated for Large bowel obstruction. It looks like she had a Flex sig with Colostomy with Dr. Saintclair from [facility] and a general surgeon Dr. Rubin. The patients daughter called to follow up with the referral and stated the patient is requesting a transfer of care over to [facility] as well as PCP from [facility], she stated they no longer want to continue her care with them. The procedure report is available in Epic for you to review. Please let me know if you need anything further.   Thanks

## 2023-12-25 NOTE — Telephone Encounter (Signed)
 Hi Dr. Charlanne FERNS received a call from this patient granddaughter. She was wanting to follow up on what your recommendation are for her to schedule her grandmother. Patient granddaughter did stated that her grandmother is needing a colonoscopy in order for her grandmother to get another surgery done. Would you please advise on scheduling.    Thank you.

## 2024-01-01 NOTE — Telephone Encounter (Signed)
Pt is s/p Hartman's procedure for ruptured sigmoid diverticulitis/diverticular stricture/large bowel obstruction Advised FS/colon through ostomy prior to takedown by Dr. [name]  Schedule OV - first available APP appointment RG

## 2024-01-02 ENCOUNTER — Ambulatory Visit: Payer: PPO | Admitting: Internal Medicine

## 2024-01-02 ENCOUNTER — Encounter: Payer: Self-pay | Admitting: Gastroenterology

## 2024-01-02 ENCOUNTER — Encounter: Payer: Self-pay | Admitting: Internal Medicine

## 2024-01-02 VITALS — BP 140/84 | HR 93 | Temp 97.2°F | Ht 65.0 in | Wt 132.8 lb

## 2024-01-02 DIAGNOSIS — Z933 Colostomy status: Secondary | ICD-10-CM

## 2024-01-02 DIAGNOSIS — I739 Peripheral vascular disease, unspecified: Secondary | ICD-10-CM

## 2024-01-02 DIAGNOSIS — I700 Atherosclerosis of aorta: Secondary | ICD-10-CM

## 2024-01-02 DIAGNOSIS — Z9989 Dependence on other enabling machines and devices: Secondary | ICD-10-CM | POA: Insufficient documentation

## 2024-01-02 DIAGNOSIS — Z9049 Acquired absence of other specified parts of digestive tract: Secondary | ICD-10-CM

## 2024-01-02 DIAGNOSIS — R739 Hyperglycemia, unspecified: Secondary | ICD-10-CM

## 2024-01-02 DIAGNOSIS — E8809 Other disorders of plasma-protein metabolism, not elsewhere classified: Secondary | ICD-10-CM | POA: Diagnosis not present

## 2024-01-02 DIAGNOSIS — M899 Disorder of bone, unspecified: Secondary | ICD-10-CM

## 2024-01-02 DIAGNOSIS — N281 Cyst of kidney, acquired: Secondary | ICD-10-CM | POA: Diagnosis not present

## 2024-01-02 DIAGNOSIS — E871 Hypo-osmolality and hyponatremia: Secondary | ICD-10-CM

## 2024-01-02 DIAGNOSIS — K760 Fatty (change of) liver, not elsewhere classified: Secondary | ICD-10-CM

## 2024-01-02 DIAGNOSIS — I10 Essential (primary) hypertension: Secondary | ICD-10-CM

## 2024-01-02 DIAGNOSIS — K862 Cyst of pancreas: Secondary | ICD-10-CM

## 2024-01-03 NOTE — Assessment & Plan Note (Signed)
Fatty Liver CT scan indicated fatty liver, potentially contributing to low albumin levels. No liver disease history. Discussed periodic liver function tests to assess liver health. Monitor liver function tests periodically.

## 2024-01-03 NOTE — Progress Notes (Signed)
[facility]  Phone: [phone]  - Medical Office Visit -  Visit Date: 01/02/2024 Patient: Elizabeth Odom   DOB: July 04, 1941   83 y.o. Female  MRN: [id number] Patient Care Team: Lula Olszewski, MD as PCP - General (Internal Medicine) Today's Health Care Provider: Lula Olszewski, MD  ===========================================   Assessment & Plan H/O partial resection of colon Postoperative Care for Sigmoid Colon Resection Following a sigmoid colon resection due to a stricture causing bowel obstruction, pathology confirmed colitis with no malignancy. Regular bowel movements and no significant pain are reported. Awaiting colostomy reversal post-colonoscopy. Schedule a colonoscopy for colostomy reversal evaluation and continue B12 supplementation due to partial small bowel resection. Hyponatremia  Hypoalbuminemia  Pancreatic cyst Pancreatic Cyst CT scan revealed a small pancreatic cyst. No immediate intervention required. Monitor pancreatic cyst with periodic imaging. Acquired renal cyst of right kidney Kidney Cyst CT scan identified a complex kidney cyst. No smoking history reduces malignancy likelihood. Order MRI for further evaluation. Fatty liver Fatty Liver CT scan indicated fatty liver, potentially contributing to low albumin levels. No liver disease history. Discussed periodic liver function tests to assess liver health. Monitor liver function tests periodically. Ambulates with cane  Colostomy status (HCC)  Primary hypertension Hypertension Blood pressure was elevated during the visit, though usually normotensive at home. Increased stress from heating issues is noted. Prefers to avoid medication. Monitor blood pressure at home and report readings. Avoid initiating antihypertensive medication at this time. Hyperglycemia Elevated Glucose Levels Recent labs showed elevated glucose levels without a diabetes diagnosis. Discussed periodic glucose  monitoring and maintaining a balanced diet to prevent diabetes progression. Monitor glucose levels periodically and educate on maintaining a balanced diet. Atherosclerosis of aorta [facility]) Aortic Atherosclerosis CT scan showed aortic atherosclerosis. Discussed potential benefits of aspirin and cholesterol medication to reduce stroke and heart attack risk. Consider starting baby aspirin and discuss cholesterol management options. Peripheral arterial disease (HCC)  Lesion of lumbosacral vertebra Lumbar Vertebra Lesion CT scan revealed a subcentimeter lucency lesion in the L1 lumbar vertebra, likely a benign hemangioma. Monitor lumbar vertebra lesion with periodic imaging.  General Health Maintenance Receiving physical and occupational therapy at home. No additional home health services needed. Continue current physical and occupational therapy regimen.  Follow-up Schedule a follow-up visit post-colonoscopy and colostomy reversal. Coordinate with Dr. [name] for expedited colonoscopy scheduling.   ED Discharge Orders     None     Diagnoses and all orders for this visit: Hyponatremia Hypoalbuminemia Pancreatic cyst Acquired renal cyst of right kidney Fatty liver Ambulates with cane H/O partial resection of colon Colostomy status (HCC) Primary hypertension Hyperglycemia Atherosclerosis of aorta (HCC) Peripheral arterial disease (HCC) Lesion of lumbosacral vertebra  @Recommended  follow up: No follow-ups on file. Future Appointments  Date Time Provider Department Center  03/11/2024  3:20 PM Lynann Bologna, MD [facility]      Subjective  83 y.o. female who has Multiple fractures of cervical spine (HCC); Cervical spine fracture, initial encounter; Syncope and collapse; A-fib (HCC); Acute head injury; Restless leg; Amnestic 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); Acute colitis; Large bowel obstruction (HCC); Left leg DVT (HCC); ABLA (acute blood loss anemia); Palliative  care encounter; Counseling and coordination of care; Need for emotional support; Goals of care, counseling/discussion; High risk medication use; Pain; Medication management; Pressure injury of skin; Malnutrition of moderate degree; Iron deficiency anemia due to chronic blood loss; Acute pulmonary embolism (HCC); Hyponatremia; Acute urinary retention; Pancreatic cyst; Hypoalbuminemia; Acquired renal cyst  of right kidney; Fatty liver; and Ambulates with cane on their problem list. Her reasons/main concerns/chief complaints for today's office visit are New Patient (Initial Visit), Hyperglycemia (Has been elevated on recent labs last month when she was in the hospital (for about two months).), and Possible hypertensiom (High BP today.)   History of Present Illness The patient, with a recent history of hospitalization for approximately two months, presents with concerns of elevated blood pressure and glucose levels. The patient reports that her blood pressure is typically not high at home, and she does not regularly monitor it. She attributes the current elevation to recent stress, primarily related to issues with her home heating system.  The patient has a history of a significant hospital stay due to a bowel obstruction caused by a sigmoid stricture of the large bowel. This resulted in a resection of the sigmoid colon and a portion of the small bowel, and the placement of a colostomy bag. The patient reports regular bowel movements and no current pain. She is awaiting a colonoscopy and subsequent reversal of the colostomy.  The patient also reports a history of a neck injury, which required bone from her thigh to be used in a repair. 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  The patient denies a history of smoking and reports a preference for frozen ices over ice cream. She also reports taking B12 supplements daily. The patient denies any current pain  and reports feeling well overall. She is currently receiving home health care, including visits from occupational therapists, physical therapists, and nurses.  The patient has a history of a negative experience with a previous healthcare provider, which resulted in a hospitalization in critical condition. She reports a diagnosis of colitis during that time, which was not adequately addressed, leading to a worsening of her condition. The patient expresses a desire to address current health concerns after the completion of her upcoming colonoscopy and colostomy reversal.  Past Medical History - Hypertension - Elevated glucose - Colitis - Kidney cyst - Fatty liver - Aortic atherosclerosis - Bowel obstruction due to sigmoid stricture  Medications reviewed and modified: Current Outpatient Medications on File Prior to Visit  Medication Sig   acetaminophen (TYLENOL) 500 MG tablet Take 2 tablets (1,000 mg total) by mouth every 6 (six) hours as needed for mild pain (pain score 1-3), moderate pain (pain score 4-6), fever or headache.   ascorbic acid (VITAMIN C) 1000 MG tablet Take 1 tablet (1,000 mg total) by mouth daily.   Cyanocobalamin (B-12 PO) Take by mouth.   docusate sodium (COLACE) 100 MG capsule Take 1 capsule (100 mg total) by mouth 2 (two) times daily.   Multiple Vitamin (MULTIVITAMIN WITH MINERALS) TABS tablet Take 1 tablet by mouth daily.   Multiple Vitamins-Minerals (HAIR SKIN AND NAILS FORMULA PO) Take by mouth.   Omega-3 Fatty Acids (OMEGA 3 PO) Take by mouth.   UNABLE TO FIND daily. Total Beets.   clotrimazole (LOTRIMIN) 1 % cream Apply topically 2 (two) times daily. (Patient not taking: Reported on 01/02/2024)   nutrition supplement, JUVEN, (JUVEN) PACK Take 1 packet by mouth 2 (two) times daily between meals. (Patient not taking: Reported on 01/02/2024)   Nystatin (GERHARDT'S BUTT CREAM) CREA Apply 1 Application topically 3 (three) times daily. (Patient not taking: Reported on  01/02/2024)   ondansetron (ZOFRAN) 4 MG tablet Take 1 tablet (4 mg total) by mouth every 6 (six) hours as needed for nausea. (  Patient not taking: Reported on 01/02/2024)   oxyCODONE (OXY IR/ROXICODONE) 5 MG immediate release tablet Take 1 tablet (5 mg total) by mouth every 4 (four) hours as needed for moderate pain (pain score 4-6) or severe pain (pain score 7-10). (Patient not taking: Reported on 01/02/2024)   polyethylene glycol (MIRALAX / GLYCOLAX) 17 g packet Take 17 g by mouth daily as needed for mild constipation. (Patient not taking: Reported on 01/02/2024)   zinc sulfate, 50mg  elemental zinc, 220 (50 Zn) MG capsule Take 1 capsule (220 mg total) by mouth daily. (Patient not taking: Reported on 01/02/2024)   No current facility-administered medications on file prior to visit.  There are no discontinued medications.  Problems: has Multiple fractures of cervical spine (HCC); Cervical spine fracture, initial encounter; Syncope and collapse; A-fib (HCC); Acute head injury; Restless leg; Amnestic 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); Acute colitis; Large bowel obstruction (HCC); Left leg DVT (HCC); ABLA (acute blood loss anemia); Palliative care encounter; Counseling and coordination of care; Need for emotional support; Goals of care, counseling/discussion; High risk medication use; Pain; Medication management; Pressure injury of skin; Malnutrition of moderate degree; Iron deficiency anemia due to chronic blood loss; Acute pulmonary embolism (HCC); Hyponatremia; Acute urinary retention; Pancreatic cyst; Hypoalbuminemia; Acquired renal cyst of right kidney; Fatty liver; and Ambulates with cane on their problem list. Current Meds  Medication Sig   acetaminophen (TYLENOL) 500 MG tablet Take 2 tablets (1,000 mg total) by mouth every 6 (six) hours as needed for mild pain (pain score 1-3), moderate pain (pain score 4-6), fever or headache.   ascorbic acid (VITAMIN C) 1000 MG tablet Take 1 tablet  (1,000 mg total) by mouth daily.   Cyanocobalamin (B-12 PO) Take by mouth.   docusate sodium (COLACE) 100 MG capsule Take 1 capsule (100 mg total) by mouth 2 (two) times daily.   Multiple Vitamin (MULTIVITAMIN WITH MINERALS) TABS tablet Take 1 tablet by mouth daily.   Multiple Vitamins-Minerals (HAIR SKIN AND NAILS FORMULA PO) Take by mouth.   Omega-3 Fatty Acids (OMEGA 3 PO) Take by mouth.   UNABLE TO FIND daily. Total Beets.   Allergies:   Allergies  Allergen Reactions   Elemental Sulfur Anaphylaxis   Past Medical History:  has a past medical history of A-fib (HCC) (04/09/2016), Acute head injury (04/09/2016), Cervical cancer (HCC), Cervical spine fracture, initial encounter (04/09/2016), Essential hypertension, History of anemia, Multiple fractures of cervical spine (HCC) (04/09/2016), PAC (premature atrial contraction), Restless leg, and Syncope and collapse (04/09/2016). Past Surgical History:   has a past surgical history that includes Harvest bone graft; Cervical laminectomy; Cardiac catheterization (N/A, 04/10/2016); LOOP RECORDER REMOVAL (N/A, 07/08/2018); complete hysterectomy; breast lump removal (Left); Appendectomy; Flexible sigmoidoscopy (N/A, 09/29/2023); biopsy (09/29/2023); IR IVC FILTER PLMT / S&I /IMG GUID/MOD SED (10/05/2023); IR Sinus/Fist Tube Chk-Non GI (10/22/2023); IR Sinus/Fist Tube Chk-Non GI (11/05/2023); and Colectomy with colostomy creation/hartmann procedure (N/A, 10/01/2023). Social History:   reports that she has never smoked. She has never used smokeless tobacco. She reports that she does not drink alcohol and does not use drugs. Family History:  family history includes Cancer in her sister; Diabetes in her sister; Other in her mother; Stroke in her daughter; Thyroid disease in her daughter. Depression Screen and Health Maintenance:    01/02/2024    2:00 PM  PHQ 2/9 Scores  PHQ - 2 Score 0   Health Maintenance  Topic Date Due   Pneumonia Vaccine 21+ Years old (1 of  1 -  PCV) Never done   DEXA SCAN  Never done   Zoster Vaccines- Shingrix (1 of 2) 04/01/2024 (Originally 10/10/1960)   DTaP/Tdap/Td (2 - Tdap) 08/15/2030   INFLUENZA VACCINE  Completed   HPV VACCINES  Aged Out   COVID-19 Vaccine  Discontinued   Immunization History  Administered Date(s) Administered   Influenza Inj Mdck Quad Pf 09/01/2016, 09/08/2017   Influenza, Quadrivalent, Recombinant, Inj, Pf 09/07/2018, 08/23/2019   Td 08/15/2020     Objective   Physical ExamBP (!) 140/84   Pulse 93   Temp (!) 97.2 F (36.2 C) (Temporal)   Ht 5\' 5"  (1.651 m)   Wt 132 lb 12.8 oz (60.2 kg)   SpO2 98%   BMI 22.10 kg/m  Wt Readings from Last 10 Encounters:  01/02/24 132 lb 12.8 oz (60.2 kg)  11/07/23 128 lb 3.2 oz (58.2 kg)  09/20/23 124 lb (56.2 kg)  11/23/20 122 lb (55.3 kg)  09/28/20 123 lb 0.6 oz (55.8 kg)  07/08/18 132 lb (59.9 kg)  04/09/16 137 lb 6.4 oz (62.3 kg)  Vital signs reviewed.  Nursing notes reviewed. Weight trend reviewed. Abnormalities and problem-specific physical exam findings:  SKIN: Colostomy site without signs of infection or irritation. Distressed from medical events leading to colostomy, overwhelmed by requests for further evaluation of other issues today. General Appearance:  Well developed, well nourished, well-groomed, healthy-appearing female with Body mass index is 22.1 kg/m. No acute distress appreciable.   Skin: Clear and well-hydrated. Pulmonary:  Normal work of breathing at rest, no respiratory distress apparent. SpO2: 98 %  Musculoskeletal: She demonstrates smooth and coordinated movements throughout all major joints.All extremities are intact.  Neurological:  Awake, alert, oriented, and engaged.  No obvious focal neurological deficits or cognitive impairments.  Sensorium seems unclouded.  Psychiatric:  Appropriate mood, pleasant and cooperative demeanor, cheerful and engaged during the exam  Reviewed Results & Data LABS Glucose: elevated Albumin:  low Sodium: low  RADIOLOGY CT scan abdomen: pancreatic cyst, kidney cyst, fatty liver, aortic atherosclerosis, subcentimeter lesion in L1 lumbar vertebra  PATHOLOGY Rectosigmoid colon: colitis, no cancer Sigmoid stricture of large bowel       No results found for any visits on 01/02/24.  No results displayed because visit has over 200 results.    Admission on 09/20/2023, Discharged on 09/21/2023  Component Date Value   Lipase 09/20/2023 28    Sodium 09/20/2023 131 (L)    Potassium 09/20/2023 3.8    Chloride 09/20/2023 95 (L)    CO2 09/20/2023 25    Glucose, Bld 09/20/2023 157 (H)    BUN 09/20/2023 12    Creatinine, Ser 09/20/2023 0.87    Calcium 09/20/2023 9.0    Total Protein 09/20/2023 8.0    Albumin 09/20/2023 3.3 (L)    AST 09/20/2023 31    ALT 09/20/2023 48 (H)    Alkaline Phosphatase 09/20/2023 105    Total Bilirubin 09/20/2023 1.2    GFR, Estimated 09/20/2023 >60    Anion gap 09/20/2023 11    WBC 09/20/2023 17.3 (H)    RBC 09/20/2023 4.44    Hemoglobin 09/20/2023 13.6    HCT 09/20/2023 40.9    MCV 09/20/2023 92.1    MCH 09/20/2023 30.6    MCHC 09/20/2023 33.3    RDW 09/20/2023 11.8    Platelets 09/20/2023 571 (H)    nRBC 09/20/2023 0.0    Lactic Acid, Venous 09/20/2023 1.4    Prothrombin Time 09/20/2023 14.5    INR 09/20/2023 1.1  Specimen Description 09/20/2023                     Value:BLOOD RIGHT ANTECUBITAL Performed at [facility], [address] [place], [place] [address]    Special Requests 09/20/2023                     Value:BOTTLES DRAWN AEROBIC AND ANAEROBIC Blood Culture adequate volume Performed at [facility], [address], [place], [place] [address]    Culture 09/20/2023                     Value:NO GROWTH 5 DAYS Performed at [facility], [address], [place], [place] [address]    Report Status 09/20/2023 09/25/2023 FINAL    Specimen Description 09/20/2023                      Value:BLOOD BLOOD RIGHT HAND Performed at [facility], [address] [place], [place] [address]    Special Requests 09/20/2023                     Value:BOTTLES DRAWN AEROBIC AND ANAEROBIC Blood Culture results may not be optimal due to an inadequate volume of blood received in culture bottles Performed at [facility], [address], [place], [place] [address]    Culture 09/20/2023                     Value:NO GROWTH 5 DAYS Performed at [facility], [address], [place], [place] [address]    Report Status 09/20/2023 09/25/2023 FINAL    Specimen Source 09/20/2023 URINE, CATHETERIZED    Color, Urine 09/20/2023 YELLOW    APPearance 09/20/2023 CLEAR    Specific Gravity, Urine 09/20/2023 1.025    pH 09/20/2023 7.0    Glucose, UA 09/20/2023 NEGATIVE    Hgb urine dipstick 09/20/2023 NEGATIVE    Bilirubin Urine 09/20/2023 NEGATIVE    Ketones, ur 09/20/2023 NEGATIVE    Protein, ur 09/20/2023 NEGATIVE    Nitrite 09/20/2023 NEGATIVE    Leukocytes,Ua 09/20/2023 NEGATIVE    RBC / HPF 09/20/2023 0-5    WBC, UA 09/20/2023 0-5    Bacteria, UA 09/20/2023 NONE SEEN    Squamous Epithelial / HPF 09/20/2023 0-5    SARS Coronavirus 2 by RT* 09/20/2023 NEGATIVE    Influenza A by PCR 09/20/2023 NEGATIVE    Influenza B by PCR 09/20/2023 NEGATIVE    Resp Syncytial Virus by * 09/20/2023 NEGATIVE    No image results found.   IR Sinus/Fist Tube Chk-Non GI Result Date: 11/05/2023 INDICATION: 83 year old with postoperative pelvic abscess. Previous drain injection demonstrated a bowel fistula. Minimal output from the drain and evaluate for a bowel fistula. EXAM: SINUS TRACT INJECTION / FISTULOGRAM COMPARISON:  None Available. MEDICATIONS: None ANESTHESIA/SEDATION: None COMPLICATIONS: None immediate. FLUOROSCOPY: Radiation Exposure Index (as provided by the fluoroscopic device): 31 mGy Kerma TECHNIQUE: Patient was placed in left lateral decubitus position on  the interventional table. Scout image was obtained. PROCEDURE: Drain was injected with 6 mL Omnipaque 300. Following the injection, the drain was cut and removed without complication. Bandage placed at the old drain site. FINDINGS: Pigtail drain was in the posterior aspect of the pelvis. Minimal collection around the drain itself. Majority of the contrast was draining around the tube towards the skin. No evidence for a bowel fistula. IMPRESSION: 1. No  significant collection around the drain. No evidence for a bowel fistula. 2. Drain was removed. Electronically Signed   By: Richarda Overlie M.D.   On: 11/05/2023 10:35   DG Abd Portable 1V Result Date: 10/26/2023 CLINICAL DATA:  Acute generalized abdominal pain and nausea. EXAM: PORTABLE ABDOMEN - 1 VIEW COMPARISON:  October 08, 2023. FINDINGS: Percutaneous drainage catheter is noted in right lower quadrant. No abnormal bowel dilatation is noted. Contrast is noted in nondilated colon. IMPRESSION: No abnormal bowel dilatation. Percutaneous drainage catheter noted in right lower quadrant. Electronically Signed   By: Lupita Raider M.D.   On: 10/26/2023 15:26   IR Sinus/Fist Tube Chk-Non GI Result Date: 10/22/2023 CLINICAL DATA:  Pelvic abscess s/p drainage catheter placement 10/12/2023. EXAM: DRAIN INJECTION/FISTULOGRAM/ABSCESSOGRAM COMPARISON:  Pelvic XR, earlier same day. IR CT, 10/12/2023. CT AP, 10/19/2023 and 10/11/2023. CONTRAST:  10 mL Omnipaque 300-administered via the existing percutaneous drain. FLUOROSCOPY TIME:  Fluoroscopic dose; 20 mGy TECHNIQUE: The patient was positioned LEFT lateral decubitus on the fluoroscopy table. A preprocedural spot fluoroscopic image was obtained of the pelvis and the existing percutaneous drainage catheter. Multiple spot fluoroscopic and radiographic images were obtained following the injection of a small amount of contrast via the existing percutaneous drainage catheter. FINDINGS: *Similar positioning of RIGHT  transgluteal-approach pelvic drainage catheter. *Drainage catheter contrast injection without significant residual pelvic collection. *Contrast opacification along the anastomotic staple line and adjacent colon. IMPRESSION: RIGHT pelvic drainage catheter with contrast opacification of the anastomotic staple line and adjacent colon. Findings highly suspicious for fistulous communication with the colonic anastomosis Electronically Signed   By: Roanna Banning M.D.   On: 10/22/2023 15:55   DG Pelvis Portable Result Date: 10/22/2023 CLINICAL DATA:  [id number] Pelvic abscess in female [id number] EXAM: PORTABLE PELVIS 1-2 VIEWS COMPARISON:  IR drain injection, concurrent. CT AP, 10/19/2023 and 10/11/2023. IR CT, 10/12/2023. FINDINGS: RIGHT pelvic drainage catheter with pigtail tip projecting near midline and lateral to the anastomotic staple line. Contrast opacification along the colon and line. Prior intraluminal contrast opacification of ascending and transverse colon. No interval osseous abnormality. IMPRESSION: RIGHT pelvic drainage catheter with contrast opacification of the colon and staple line. Findings highly suspicious for fistulous communication with anastomosis. Electronically Signed   By: Roanna Banning M.D.   On: 10/22/2023 11:21   CT ABDOMEN PELVIS W CONTRAST Result Date: 10/19/2023 CLINICAL DATA:  Small bowel resection with anastomotic leak and fluid collection, status post percutaneous drain placement, history of colon cancer status post resection * Tracking Code: BO * EXAM: CT ABDOMEN AND PELVIS WITH CONTRAST TECHNIQUE: Multidetector CT imaging of the abdomen and pelvis was performed using the standard protocol following bolus administration of intravenous contrast. RADIATION DOSE REDUCTION: This exam was performed according to the departmental dose-optimization program which includes automated exposure control, adjustment of the mA and/or kV according to patient size and/or use of iterative reconstruction  technique. CONTRAST:  OMNIPAQUE IOHEXOL 300 MG/ML  SOLN COMPARISON:  10/11/2023 FINDINGS: Lower chest: Small bilateral pleural effusions and associated atelectasis or consolidation. Hepatobiliary: No solid liver abnormality is seen. No gallstones, gallbladder wall thickening, or biliary dilatation. Pancreas: Unremarkable. No pancreatic ductal dilatation or surrounding inflammatory changes. Spleen: Normal in size without significant abnormality. Adrenals/Urinary Tract: Adrenal glands are unremarkable. Kidneys are normal, without renal calculi, solid lesion, or hydronephrosis. Foley catheter in the bladder. Stomach/Bowel: Stomach is within normal limits. Status post Gertie Gowda procedure sigmoid colon resection with left lower quadrant end colostomy and rectal stump. Descending colonic diverticulosis. Vascular/Lymphatic: Aortic  atherosclerosis. Infrarenal IVC filter. No enlarged abdominal or pelvic lymph nodes. Reproductive: Status post hysterectomy. Other: Status post midline laparotomy. Interval placement of a right posterior approach percutaneous pigtail drain catheter within the low right hemipelvis, with resolution of a previously seen air and fluid collection in this vicinity (series 2, image 63). There is an adjacent small bowel anastomosis containing air and fluid but without enteric contrast (series 2, image 63). Musculoskeletal: No acute or significant osseous findings. IMPRESSION: 1. Interval placement of a right posterior approach percutaneous pigtail drain catheter within the low right hemipelvis, with resolution of a previously seen air and fluid collection in this vicinity. 2. Adjacent small bowel anastomosis containing air and fluid but without enteric contrast. No obvious evidence of persistent anastomotic leak or fistula. 3. Status post midline laparotomy and sigmoid colon resection with left lower quadrant end colostomy and rectal stump. 4. Small bilateral pleural effusions and associated  atelectasis or consolidation. 5. Infrarenal IVC filter. Aortic Atherosclerosis (ICD10-I70.0). Electronically Signed   By: Jearld Lesch M.D.   On: 10/19/2023 14:00   CT GUIDED VISCERAL FLUID DRAIN BY PERC CATH Result Date: 10/12/2023 INDICATION: [id number] Abscess [id number] EXAM: CT-GUIDED PELVIC ABSCESS DRAINAGE CATHETER PLACEMENT COMPARISON:  CT AP, 10/11/2023 MEDICATIONS: The patient is currently admitted to the hospital and receiving intravenous antibiotics. The antibiotics were administered within an appropriate time frame prior to the initiation of the procedure. ANESTHESIA/SEDATION: Moderate (conscious) sedation was employed during this procedure. A total of Versed 1.5 mg and Fentanyl 75 mcg was administered intravenously. Moderate Sedation Time: 25 minutes. The patient's level of consciousness and vital signs were monitored continuously by radiology nursing throughout the procedure under my direct supervision. CONTRAST:  None FLUOROSCOPY TIME:  CT dose; 948 mGycm COMPLICATIONS: None immediate. PROCEDURE: RADIATION DOSE REDUCTION: This exam was performed according to the departmental dose-optimization program which includes automated exposure control, adjustment of the mA and/or kV according to patient size and/or use of iterative reconstruction technique. Informed written consent was obtained from the patient and/or patient's representative after a discussion of the risks, benefits and alternatives to treatment. The patient was placed prone on the CT gantry and a pre procedural CT was performed re-demonstrating the known abscess/fluid collection within the pelvis. The procedure was planned. A timeout was performed prior to the initiation of the procedure. The RIGHT gluteus was prepped and draped in the usual sterile fashion. The overlying soft tissues were anesthetized with 1% lidocaine with epinephrine. Appropriate trajectory was planned with the use of a 22 gauge spinal needle. An 18 gauge trocar needle was  advanced into the abscess/fluid collection and a short Amplatz super stiff wire was coiled within the collection. Appropriate positioning was confirmed with a limited CT scan. The tract was serially dilated allowing placement of a 12 Fr drainage catheter. Appropriate positioning was confirmed with a limited postprocedural CT scan. 10 mL of serosanguineous fluid was aspirated. The tube was connected to a drainage bag and sutured in place. A dressing was placed. The patient tolerated the procedure well without immediate post procedural complication. IMPRESSION: Successful CT guided placement of a 12 Fr percutaneous drain catheter into the pelvic abscess via a RIGHT transgluteal approach Aspiration of 10 mL of serous sanguinous fluid. Samples were sent to the laboratory as requested by the ordering clinical team. Roanna Banning, MD [facility] Electronically Signed   By: Roanna Banning M.D.   On: 10/12/2023 17:20   [place] EKG SITE RITE Result Date: 10/11/2023 If Site  Rite image not attached, placement could not be confirmed due to current cardiac rhythm.  CT ABDOMEN PELVIS W CONTRAST Result Date: 10/11/2023 CLINICAL DATA:  Postoperative abdominal pain. EXAM: CT ABDOMEN AND PELVIS WITH CONTRAST TECHNIQUE: Multidetector CT imaging of the abdomen and pelvis was performed using the standard protocol following bolus administration of intravenous contrast. RADIATION DOSE REDUCTION: This exam was performed according to the departmental dose-optimization program which includes automated exposure control, adjustment of the mA and/or kV according to patient size and/or use of iterative reconstruction technique. CONTRAST:  OMNIPAQUE IOHEXOL 300 MG/ML  SOLN COMPARISON:  October 04, 2023. FINDINGS: Lower chest: Mild bilateral posterior basilar subsegmental atelectasis is noted. Hepatobiliary: No focal liver abnormality is seen. No gallstones, gallbladder wall  thickening, or biliary dilatation. Pancreas: Unremarkable. No pancreatic ductal dilatation or surrounding inflammatory changes. Spleen: Normal in size without focal abnormality. Adrenals/Urinary Tract: Adrenal glands appear normal. Stable right renal cyst for which no further follow-up is required. Small left renal calculus. No hydronephrosis or renal obstruction is noted. Urinary bladder is unremarkable. Stomach/Bowel: Nasogastric tube tip is seen in stomach. Colostomy is noted in left lower quadrant with Hartmann's pouch. Status post partial small bowel obstruction and partial left colectomy. No significant bowel dilatation is noted. Small bowel surgical anastomosis is noted in the pelvis. There appears to be extravasation of contrast in this area suggesting possible bowel perforation. This is best seen on image number 60 of series 7. Contrast appears to be extending into posterior pelvic complex fluid collection which measures 7 x 6 cm. There is high density material within this suggesting clot. Vascular/Lymphatic: Aortic atherosclerosis. No enlarged abdominal or pelvic lymph nodes. IVC filter is noted in infrarenal position. Reproductive: Status post hysterectomy. No adnexal masses. Other: Midline surgical incision is noted anteriorly in the pelvis. No definite hernia is noted. Musculoskeletal: No acute or significant osseous findings. IMPRESSION: Extensive postsurgical changes are noted, with colostomy seen in left lower quadrant as well as Hartmann's pouch. Status post partial left colectomy. Small bowel anastomotic site is noted in the pelvis, and there does appear to be extravasation of contrast in this area suggesting perforation or leakage. Contrast is seen extending into 7 x 6 cm complex fluid collection posteriorly in the pelvis which appears to contain some degree of clot as well. These results will be called to the ordering clinician or representative by the Radiologist Assistant, and communication  documented in the PACS or zVision Dashboard. Aortic Atherosclerosis (ICD10-I70.0). Electronically Signed   By: Lupita Raider M.D.   On: 10/11/2023 11:44   DG Abd 1 View Result Date: 10/08/2023 CLINICAL DATA:  Nasogastric tube placement. EXAM: ABDOMEN - 1 VIEW COMPARISON:  Radiograph 10/06/2023 FINDINGS: Tip and side port of the enteric tube below the diaphragm in the stomach. Few prominent air-filled loops of small bowel in the pelvis. IVC filter in place. IMPRESSION: Tip and side port of the enteric tube below the diaphragm in the stomach. Electronically Signed   By: Narda Rutherford M.D.   On: 10/08/2023 09:59    MR ABDOMEN W WO CONTRAST Result Date: 09/28/2023 CLINICAL DATA:  9 mm pancreatic cystic lesion on CT. EXAM: MRI ABDOMEN WITHOUT AND WITH CONTRAST TECHNIQUE: Multiplanar multisequence MR imaging of the abdomen was performed both before and after the administration of intravenous contrast. CONTRAST:  5mL GADAVIST GADOBUTROL 1 MMOL/ML IV SOLN COMPARISON:  CT of 1 day prior. FINDINGS: Portions of exam are mild to moderately motion degraded. Lower chest: Mild cardiomegaly, without pericardial or  pleural effusion. Hepatobiliary: Normal liver. Normal gallbladder, without biliary ductal dilatation. Pancreas: No main pancreatic duct dilatation. Corresponding the CT abnormality, within the pancreatic body/tail junction, is a cystic lesion which measures up to 9 mm on 19/24. No suspicious postcontrast characteristics including on 42/19. No peripancreatic edema. Spleen:  Normal in size, without focal abnormality. Adrenals/Urinary Tract: Normal adrenal glands. Dominant lower pole right renal cyst at 6.3 cm does not warrant imaging follow-up. Normal left kidney. No hydronephrosis. Stomach/Bowel: Normal stomach and small bowel. Colonic obstruction as detailed on CT. Vascular/Lymphatic: Aortic atherosclerosis. No retroperitoneal or retrocrural adenopathy. Other:  No ascites. Musculoskeletal: No acute osseous  abnormality. IMPRESSION: 1. Mild to moderately motion degraded exam. 2. 9 mm cystic lesion within the pancreatic body/tail junction with differential considerations of pseudocyst or indolent cystic neoplasm. Per consensus criteria, this warrants follow-up with pre and post contrast abdominal MRI at 1 year. This recommendation follows ACR consensus guidelines: Management of Incidental Pancreatic Cysts: A White Paper of the ACR Incidental Findings Committee. J Am Coll Radiol 2017;14:911-923. 3.  Aortic Atherosclerosis (ICD10-I70.0). 4. Colonic obstruction, as on CT. Electronically Signed   By: Jeronimo Greaves M.D.   On: 09/28/2023 08:52   MR 3D Recon At Scanner Result Date: 09/28/2023 CLINICAL DATA:  9 mm pancreatic cystic lesion on CT. EXAM: MRI ABDOMEN WITHOUT AND WITH CONTRAST TECHNIQUE: Multiplanar multisequence MR imaging of the abdomen was performed both before and after the administration of intravenous contrast. CONTRAST:  5mL GADAVIST GADOBUTROL 1 MMOL/ML IV SOLN COMPARISON:  CT of 1 day prior. FINDINGS: Portions of exam are mild to moderately motion degraded. Lower chest: Mild cardiomegaly, without pericardial or pleural effusion. Hepatobiliary: Normal liver. Normal gallbladder, without biliary ductal dilatation. Pancreas: No main pancreatic duct dilatation. Corresponding the CT abnormality, within the pancreatic body/tail junction, is a cystic lesion which measures up to 9 mm on 19/24. No suspicious postcontrast characteristics including on 42/19. No peripancreatic edema. Spleen:  Normal in size, without focal abnormality. Adrenals/Urinary Tract: Normal adrenal glands. Dominant lower pole right renal cyst at 6.3 cm does not warrant imaging follow-up. Normal left kidney. No hydronephrosis. Stomach/Bowel: Normal stomach and small bowel. Colonic obstruction as detailed on CT. Vascular/Lymphatic: Aortic atherosclerosis. No retroperitoneal or retrocrural adenopathy. Other:  No ascites. Musculoskeletal: No acute  osseous abnormality. IMPRESSION: 1. Mild to moderately motion degraded exam. 2. 9 mm cystic lesion within the pancreatic body/tail junction with differential considerations of pseudocyst or indolent cystic neoplasm. Per consensus criteria, this warrants follow-up with pre and post contrast abdominal MRI at 1 year. This recommendation follows ACR consensus guidelines: Management of Incidental Pancreatic Cysts: A White Paper of the ACR Incidental Findings Committee. J Am Coll Radiol 2017;14:911-923. 3.  Aortic Atherosclerosis (ICD10-I70.0). 4. Colonic obstruction, as on CT. Electronically Signed   By: Jeronimo Greaves M.D.   On: 09/28/2023 08:52      Attestation:  I have personally spent  38 minutes involved in face-to-face and non-face-to-face activities for this patient on the day of the visit. Professional time spent includes the following activities:  Preparing to see the patient by reviewing medical records prior to and during the encounter; Obtaining, documenting, and reviewing an updated medical history; Performing a medically appropriate examination;  Evaluating, synthesizing, and documenting the available clinical information in the EMR;  Coordinating/Communicating with other health care professionals; Independently interpreting results (not separately reported), Communicating, counseling, educating about results to the patient/family/caregiver (not separately reported); Collaboratively developing and communicating an individualized treatment plan with the patient; Placing medically necessary  orders (for medications/tests/procedures/referrals);   This time was independent of any separately billable procedure(s).  The extended duration of this patient visit was medically necessary due to several factors:  The patient's health condition is multifaceted, requiring a comprehensive evaluation of patient and their past records to ensure accurate diagnosis and treatment planning; Effective patient education and  communication, particularly for patients with complex care needs, often require additional time to ensure the patient (or caregivers) fully understand the care plan;  Coordination of care with other healthcare professionals and services depends on thorough documentation, extending both documentation time and visit durations.  All these factors are integral to providing high-quality patient care and ensuring optimal health outcomes.      Additional notes: Initial Appointment Goals:  This initial visit focused on establishing a foundation for the patient's care. We collaboratively reviewed her medical history and medications in detail, updating the chart as shown in the encounter. Given the extensive information, we prioritized addressing her most pressing concerns, which she reported were: New Patient (Initial Visit), Hyperglycemia (Has been elevated on recent labs last month when she was in the hospital (for about two months).), and Possible hypertensiom (High BP today.)  While the complexity of the patient's medical picture may necessitate further evaluation in subsequent visits, we were able to develop a preliminary care plan together. To expedite a comprehensive plan at the next visit, we encouraged the patient to gather relevant medical records from previous providers. This collaborative approach will ensure a more complete understanding of the patient's health and inform the development of a personalized care plan. We look forward to continuing the conversation and working together with the patient on achieving her health goals.   Collaborative Documentation:  Today's encounter utilized real-time, dynamic patient engagement.  Patients actively participate by directly reviewing and assisting in updating their medical records through a shared screen. This transparency empowers patients to visually confirm chart updates made by the healthcare provider.  This collaborative approach facilitates problem  management as we jointly update the problem list, problem overview, and assessment/plan. Ultimately, this process enhances chart accuracy and completeness, fostering shared decision-making, patient education, and informed consent for tests and treatments.  Collaborative Treatment Planning:  Treatment plans were discussed and reviewed in detail.  Explained medication safety and potential side effects.  Encouraged participation and answered all patient questions, confirming understanding and comfort with the plan. Encouraged patient to contact our office if they have any questions or concerns. Agreed on patient returning to office if symptoms worsen, persist, or new symptoms develop. Discussed precautions in case of needing to visit the Emergency Department.  ----------------------------------------------------- Lula Olszewski, MD  01/03/2024 7:00 PM  [facility]:  [phone]

## 2024-01-03 NOTE — Assessment & Plan Note (Signed)
Pancreatic Cyst CT scan revealed a small pancreatic cyst. No immediate intervention required. Monitor pancreatic cyst with periodic imaging.

## 2024-01-03 NOTE — Patient Instructions (Signed)
VISIT SUMMARY:  During today's visit, we discussed your recent health concerns, including elevated blood pressure and glucose levels, and reviewed your postoperative care following your sigmoid colon resection. We also addressed findings from your recent CT scan and planned the next steps for your ongoing care.  YOUR PLAN:  -POSTOPERATIVE CARE FOR SIGMOID COLON RESECTION: You are recovering well from your sigmoid colon resection, with no significant pain and regular bowel movements. We will schedule a colonoscopy to evaluate for the reversal of your colostomy. Continue taking your B12 supplements daily.  -HYPERTENSION: Your blood pressure was elevated today, likely due to recent stress. Hypertension means high blood pressure. Please monitor your blood pressure at home and report your readings. We will avoid starting any new medication for now.  -ELEVATED GLUCOSE LEVELS: Your recent labs showed elevated glucose levels, which means higher than normal blood sugar. This is not yet diabetes, but we need to monitor it. Please check your glucose levels periodically and maintain a balanced diet to prevent diabetes.  -AORTIC ATHEROSCLEROSIS: A CT scan showed aortic atherosclerosis, which is the buildup of fats and cholesterol in the artery walls. This can increase the risk of stroke and heart attack. We discussed starting baby aspirin and managing your cholesterol levels.  -FATTY LIVER: Your CT scan indicated fatty liver, which means there is excess fat in your liver. This can affect liver function. We will monitor your liver function tests periodically to ensure your liver stays healthy.  -KIDNEY CYST: A CT scan identified a complex kidney cyst. A cyst is a fluid-filled sac. We will order an MRI to get a better look at it.  -PANCREATIC CYST: Your CT scan revealed a small pancreatic cyst, which is a fluid-filled sac in the pancreas. No immediate action is needed, but we will monitor it with periodic  imaging.  -LUMBAR VERTEBRA LESION: A CT scan showed a small lesion in your L1 lumbar vertebra, likely a benign hemangioma, which is a non-cancerous growth. We will monitor it with periodic imaging.  -GENERAL HEALTH MAINTENANCE: You are receiving physical and occupational therapy at home, which is helping you build strength. No additional home health services are needed at this time. Continue with your current therapy regimen.  INSTRUCTIONS:  Please schedule a follow-up visit after your colonoscopy and colostomy reversal. We will coordinate with Dr. [name] to expedite the scheduling of your colonoscopy.

## 2024-01-03 NOTE — Assessment & Plan Note (Signed)
Kidney Cyst CT scan identified a complex kidney cyst. No smoking history reduces malignancy likelihood. Order MRI for further evaluation.

## 2024-01-07 NOTE — Telephone Encounter (Signed)
Copied from CRM [id number]. Topic: General - Other >> Jan 07, 2024  2:55 PM Florestine Avers wrote: Reason for CRM: Fayrene Fearing called from [facility] medical supply. He states that he will be faxing over forms for Dr, Alycia Rossetti to sign for the patient in regards to a medical supply order for "Ostomy Care". I confirmed office fax number for Fayrene Fearing, and he said he will be sending over the forms shortly.   Noted. I have not receive these forms as of yet.

## 2024-01-29 ENCOUNTER — Telehealth: Payer: Self-pay | Admitting: Internal Medicine

## 2024-01-29 NOTE — Telephone Encounter (Signed)
 Copied from CRM [id number]. Topic: General - Other >> Jan 29, 2024  4:45 PM Eunice Blase wrote: Reason for CRM: Received call from [facility] per [facility] ph: [phone] on 12/06/2023 need add on discipline order.Please call Cathy.

## 2024-01-30 NOTE — Telephone Encounter (Signed)
 Spoke with Cahthy from Home Health ph: [phone]  Stated form was refaxed yesterday  Please  sign and fax form order

## 2024-02-04 ENCOUNTER — Telehealth: Payer: Self-pay | Admitting: Internal Medicine

## 2024-02-04 NOTE — Telephone Encounter (Signed)
 [facility] faxed document Home Health Certificate (Order [place] [id number]), to be filled out by provider. Patient requested to send it back via Fax within 5-days. Document is located in providers tray at front office.Please advise at  [phone].

## 2024-02-05 NOTE — Telephone Encounter (Signed)
 Spoke with [facility] and advised them that Dr.Morrison is unable to sign these orders as she was not his patient during the dates on the order.

## 2024-02-13 ENCOUNTER — Telehealth: Payer: Self-pay | Admitting: Internal Medicine

## 2024-02-13 NOTE — Telephone Encounter (Signed)
 Erroneous note

## 2024-02-13 NOTE — Telephone Encounter (Deleted)
 ERRONEOUS

## 2024-02-14 DIAGNOSIS — Z433 Encounter for attention to colostomy: Secondary | ICD-10-CM | POA: Diagnosis not present

## 2024-02-14 DIAGNOSIS — K5730 Diverticulosis of large intestine without perforation or abscess without bleeding: Secondary | ICD-10-CM | POA: Diagnosis not present

## 2024-02-14 DIAGNOSIS — Z9049 Acquired absence of other specified parts of digestive tract: Secondary | ICD-10-CM | POA: Diagnosis not present

## 2024-02-14 DIAGNOSIS — Z1211 Encounter for screening for malignant neoplasm of colon: Secondary | ICD-10-CM | POA: Diagnosis not present

## 2024-02-20 DIAGNOSIS — S0990XA Unspecified injury of head, initial encounter: Secondary | ICD-10-CM | POA: Diagnosis not present

## 2024-02-20 DIAGNOSIS — W010XXA Fall on same level from slipping, tripping and stumbling without subsequent striking against object, initial encounter: Secondary | ICD-10-CM | POA: Diagnosis not present

## 2024-03-07 ENCOUNTER — Ambulatory Visit (HOSPITAL_COMMUNITY)
Admission: RE | Admit: 2024-03-07 | Discharge: 2024-03-07 | Disposition: A | Discharge status: Home / Self Care | Source: Ambulatory Visit | Attending: Nurse Practitioner | Admitting: Nurse Practitioner

## 2024-03-07 DIAGNOSIS — Z433 Encounter for attention to colostomy: Secondary | ICD-10-CM

## 2024-03-07 DIAGNOSIS — Z434 Encounter for attention to other artificial openings of digestive tract: Secondary | ICD-10-CM | POA: Diagnosis not present

## 2024-03-07 NOTE — Progress Notes (Signed)
 [facility]   Reason for visit:  LMQ colostomy, here with granddaughter today.  HPI:  Stricture causing bowel obstruction, colon resection and LMQ colostomy Past Medical History:  Diagnosis Date   A-fib (HCC) 04/09/2016   Acute head injury 04/09/2016   Cervical cancer (HCC)    Cervical spine fracture, initial encounter 04/09/2016   Essential hypertension    History of anemia    Multiple fractures of cervical spine (HCC) 04/09/2016   PAC (premature atrial contraction)    Restless leg    Syncope and collapse 04/09/2016   Family History  Problem Relation Age of Onset   Other Mother        thrombosis   Cancer Sister    Diabetes Sister    Stroke Daughter    Thyroid disease Daughter    Allergies  Allergen Reactions   Elemental Sulfur Anaphylaxis   Current Outpatient Medications  Medication Sig Dispense Refill Last Dose/Taking   acetaminophen (TYLENOL) 500 MG tablet Take 2 tablets (1,000 mg total) by mouth every 6 (six) hours as needed for mild pain (pain score 1-3), moderate pain (pain score 4-6), fever or headache.      ascorbic acid (VITAMIN C) 1000 MG tablet Take 1 tablet (1,000 mg total) by mouth daily. 30 tablet 0    clotrimazole (LOTRIMIN) 1 % cream Apply topically 2 (two) times daily. (Patient not taking: Reported on 01/02/2024) 30 g 0    Cyanocobalamin (B-12 PO) Take by mouth.      docusate sodium (COLACE) 100 MG capsule Take 1 capsule (100 mg total) by mouth 2 (two) times daily. 10 capsule 0    Multiple Vitamin (MULTIVITAMIN WITH MINERALS) TABS tablet Take 1 tablet by mouth daily. 120 tablet 0    Multiple Vitamins-Minerals (HAIR SKIN AND NAILS FORMULA PO) Take by mouth.      nutrition supplement, JUVEN, (JUVEN) PACK Take 1 packet by mouth 2 (two) times daily between meals. (Patient not taking: Reported on 01/02/2024) 30 each 0    Nystatin (GERHARDT'S BUTT CREAM) CREA Apply 1 Application topically 3 (three) times daily. (Patient not taking: Reported on 01/02/2024)  300 each 0    Omega-3 Fatty Acids (OMEGA 3 PO) Take by mouth.      ondansetron (ZOFRAN) 4 MG tablet Take 1 tablet (4 mg total) by mouth every 6 (six) hours as needed for nausea. (Patient not taking: Reported on 01/02/2024) 20 tablet 0    oxyCODONE (OXY IR/ROXICODONE) 5 MG immediate release tablet Take 1 tablet (5 mg total) by mouth every 4 (four) hours as needed for moderate pain (pain score 4-6) or severe pain (pain score 7-10). (Patient not taking: Reported on 01/02/2024) 30 tablet 0    polyethylene glycol (MIRALAX / GLYCOLAX) 17 g packet Take 17 g by mouth daily as needed for mild constipation. (Patient not taking: Reported on 01/02/2024) 14 each 0    UNABLE TO FIND daily. Total Beets.      zinc sulfate, 50mg  elemental zinc, 220 (50 Zn) MG capsule Take 1 capsule (220 mg total) by mouth daily. (Patient not taking: Reported on 01/02/2024) 30 capsule 0    No current facility-administered medications for this encounter.   ROS  Review of Systems  Constitutional:  Positive for fatigue.  Gastrointestinal:        LMQ colostomy  Musculoskeletal:  Positive for arthralgias.       Impaired dexterity in hands.   Skin:  Positive for color change.  Psychiatric/Behavioral: Negative.    All  other systems reviewed and are negative.  Vital signs:  BP (!) 155/84 (BP Location: Right Arm) Comment: RN Notified  Pulse 72   Temp (!) 97.4 F (36.3 C) (Oral)   Resp 18   SpO2 99%  Exam:  Physical Exam Vitals reviewed.  Constitutional:      Appearance: Normal appearance.  HENT:     Mouth/Throat:     Mouth: Mucous membranes are moist.  Cardiovascular:     Rate and Rhythm: Normal rate and regular rhythm.     Pulses: Normal pulses.  Pulmonary:     Effort: Pulmonary effort is normal.     Breath sounds: Normal breath sounds.  Abdominal:     Palpations: Abdomen is soft.  Musculoskeletal:        General: Normal range of motion.  Skin:    General: Skin is warm and dry.  Neurological:     General: No focal  deficit present.     Mental Status: She is alert and oriented to person, place, and time. Mental status is at baseline.     Stoma type/location:  LMQ colostomy, pink and moist  Stomal assessment/size:  30mm Peristomal assessment:  intact Treatment options for stomal/peristomal skin: uses paste, prefers this to barrier ring  Output: soft brown stool.  Taking daily colace Ostomy pouching: 1pc.convex with paste.  Recommend belt for added security.  Would benefit from presized pouch as cutting the barrier is difficult due to dexterity issues in her hands.  Education provided:  performed pouch change.  Will enroll with [facility] for supplies.     Impression/dx  colostomy Discussion  Use of paste vs ring, adding ostomy belt for added security.  Pre sized pouch may aide in independence with ostomy care Plan  See back as needed.     Visit time: 55 minutes.   Mike Gip FNP-BC

## 2024-03-07 NOTE — Discharge Instructions (Signed)
 I will set up with [facility]  [phone] 1 piece convex presized pouch Barrier ring or paste

## 2024-03-10 ENCOUNTER — Other Ambulatory Visit (HOSPITAL_COMMUNITY): Payer: Self-pay | Admitting: Nurse Practitioner

## 2024-03-10 DIAGNOSIS — Z433 Encounter for attention to colostomy: Secondary | ICD-10-CM | POA: Insufficient documentation

## 2024-03-10 DIAGNOSIS — L24B3 Irritant contact dermatitis related to fecal or urinary stoma or fistula: Secondary | ICD-10-CM | POA: Insufficient documentation

## 2024-03-11 ENCOUNTER — Ambulatory Visit: Payer: PPO | Admitting: Gastroenterology

## 2024-03-11 DIAGNOSIS — K5730 Diverticulosis of large intestine without perforation or abscess without bleeding: Secondary | ICD-10-CM | POA: Diagnosis not present

## 2024-03-11 DIAGNOSIS — Z1211 Encounter for screening for malignant neoplasm of colon: Secondary | ICD-10-CM | POA: Diagnosis not present

## 2024-03-11 DIAGNOSIS — K635 Polyp of colon: Secondary | ICD-10-CM | POA: Diagnosis not present

## 2024-03-13 DIAGNOSIS — K635 Polyp of colon: Secondary | ICD-10-CM | POA: Diagnosis not present

## 2024-04-07 ENCOUNTER — Telehealth (HOSPITAL_BASED_OUTPATIENT_CLINIC_OR_DEPARTMENT_OTHER): Payer: Self-pay | Admitting: *Deleted

## 2024-04-07 ENCOUNTER — Ambulatory Visit: Payer: Self-pay | Admitting: Surgery

## 2024-04-07 ENCOUNTER — Encounter: Payer: Self-pay | Admitting: Surgery

## 2024-04-07 DIAGNOSIS — Z86711 Personal history of pulmonary embolism: Secondary | ICD-10-CM | POA: Diagnosis present

## 2024-04-07 DIAGNOSIS — R739 Hyperglycemia, unspecified: Secondary | ICD-10-CM

## 2024-04-07 DIAGNOSIS — K5730 Diverticulosis of large intestine without perforation or abscess without bleeding: Secondary | ICD-10-CM | POA: Diagnosis present

## 2024-04-07 DIAGNOSIS — Z86718 Personal history of other venous thrombosis and embolism: Secondary | ICD-10-CM

## 2024-04-07 DIAGNOSIS — Z95828 Presence of other vascular implants and grafts: Secondary | ICD-10-CM

## 2024-04-07 DIAGNOSIS — K56609 Unspecified intestinal obstruction, unspecified as to partial versus complete obstruction: Secondary | ICD-10-CM | POA: Diagnosis not present

## 2024-04-07 DIAGNOSIS — Z933 Colostomy status: Secondary | ICD-10-CM | POA: Diagnosis not present

## 2024-04-07 DIAGNOSIS — I10 Essential (primary) hypertension: Secondary | ICD-10-CM | POA: Diagnosis not present

## 2024-04-07 DIAGNOSIS — I4811 Longstanding persistent atrial fibrillation: Secondary | ICD-10-CM | POA: Diagnosis not present

## 2024-04-07 DIAGNOSIS — K56699 Other intestinal obstruction unspecified as to partial versus complete obstruction: Secondary | ICD-10-CM | POA: Diagnosis not present

## 2024-04-07 NOTE — Telephone Encounter (Signed)
   Pre-operative Risk Assessment    Patient Name: Elizabeth Odom  DOB: 1941/05/06 MRN: [id number]   Date of last office visit: 09/28/2020 Date of next office visit: 04/08/2024 Pre OP added to upcoming appt tomorrow and sent to device.    Request for Surgical Clearance    Procedure:   Robotic Ostomy Takedown  Date of Surgery:  Clearance TBD                                 Surgeon:  Dr. Candyce Champagne Surgeon's Group or Practice Name:  [facility] Phone number:  [phone] Fax number:  [phone]   Type of Clearance Requested:   - Medical    Type of Anesthesia:  General    Additional requests/questions:    Signed, Lauris Port   04/07/2024, 2:19 PM

## 2024-04-08 ENCOUNTER — Encounter: Payer: Self-pay | Admitting: Cardiology

## 2024-04-08 ENCOUNTER — Ambulatory Visit: Attending: Cardiology | Admitting: Cardiology

## 2024-04-08 ENCOUNTER — Other Ambulatory Visit (HOSPITAL_COMMUNITY): Payer: Self-pay

## 2024-04-08 VITALS — BP 181/84 | HR 62 | Resp 16 | Ht 65.0 in | Wt 128.6 lb

## 2024-04-08 DIAGNOSIS — K56609 Unspecified intestinal obstruction, unspecified as to partial versus complete obstruction: Secondary | ICD-10-CM | POA: Diagnosis not present

## 2024-04-08 DIAGNOSIS — Z01810 Encounter for preprocedural cardiovascular examination: Secondary | ICD-10-CM | POA: Diagnosis not present

## 2024-04-08 DIAGNOSIS — I491 Atrial premature depolarization: Secondary | ICD-10-CM | POA: Insufficient documentation

## 2024-04-08 DIAGNOSIS — Z86718 Personal history of other venous thrombosis and embolism: Secondary | ICD-10-CM | POA: Diagnosis not present

## 2024-04-08 DIAGNOSIS — I10 Essential (primary) hypertension: Secondary | ICD-10-CM

## 2024-04-08 DIAGNOSIS — Z933 Colostomy status: Secondary | ICD-10-CM | POA: Diagnosis not present

## 2024-04-08 DIAGNOSIS — E782 Mixed hyperlipidemia: Secondary | ICD-10-CM | POA: Diagnosis not present

## 2024-04-08 DIAGNOSIS — K56699 Other intestinal obstruction unspecified as to partial versus complete obstruction: Secondary | ICD-10-CM | POA: Diagnosis not present

## 2024-04-08 DIAGNOSIS — Z86711 Personal history of pulmonary embolism: Secondary | ICD-10-CM | POA: Diagnosis not present

## 2024-04-08 DIAGNOSIS — K5730 Diverticulosis of large intestine without perforation or abscess without bleeding: Secondary | ICD-10-CM | POA: Diagnosis not present

## 2024-04-08 DIAGNOSIS — I4811 Longstanding persistent atrial fibrillation: Secondary | ICD-10-CM | POA: Diagnosis not present

## 2024-04-08 LAB — CBC

## 2024-04-08 MED ORDER — AMLODIPINE BESYLATE 5 MG PO TABS
5.0000 mg | ORAL_TABLET | Freq: Every day | ORAL | 3 refills | Status: AC
  Filled 2024-04-08: qty 90, 90d supply, fill #0

## 2024-04-08 NOTE — Progress Notes (Signed)
 Cardiology Office Note:  .   Date:  04/08/2024  ID:  Elizabeth Odom, DOB May 31, 1941, MRN [id number] PCP: Anthon Kins, MD  [facility] Providers Cardiologist:  Fransico Ivy, MD PCP: Anthon Kins, MD  Chief Complaint  Patient presents with   Pre-op Exam    Robotic Ostomy Takedown Dr. Candyce Champagne w/ [facility] Elizabeth Odom is a 83 y.o. female here for pre op cardiac evaluation  Patient was last seen by Dr. Sandee Crook in 2021 for syncope and PACs.  Her implantable recorder that did not show significant arrhythmia.   Patient is here with her daughter today, lives in [place]. Patient lives by herself.  She had a large bowel obstruction that led to colon resection and creation of ostomy in 09/2020.  She had prolonged hospital stay without any cardiac.  During hospitalization, she was also found to have left leg DVT and small PE, with no RV strain, preserved LV and RV systolic function on echocardiogram.  Given her recent surgery and inability to use anticoagulation, an IVC filter was placed, which was subsequently removed, per the patient.  Patient is now going to undergo ostomy revision in the near future by Dr. Hershell Lose with [facility] surgery.  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  Blood pressure elevated on 2 separate checks today.  Reviewed lipid panel results from a year ago.      Vitals:   04/08/24 0856 04/08/24 0927  BP: (!) 152/82 (!) 181/84  Pulse: 74 62  Resp: 16   SpO2: 98%       Review of Systems  Cardiovascular:  Negative for chest pain, dyspnea on exertion, leg swelling, palpitations and syncope.        Studies Reviewed: Aaron Aas        EKG 04/08/2024: Normal sinus rhythm Normal ECG When compared with ECG of 03-Oct-2023 04:15, Premature ventricular complexes are no longer Present Criteria for Septal infarct are no  longer Present T wave inversion no longer evident in Inferior leads Nonspecific T wave abnormality no longer evident in Anterolateral leads  Independently interpreted 10/2023: Hb 10.1 Cr 0.85 HbA1C 5.6%  04/2023: Chol 208, TG 135, HDL 58, LDL 128  Risk Assessment/Calculations:   RCRI score: 0 Perioperative risk of major cardiac events: 0.4%    Physical Exam Vitals and nursing note reviewed.  Constitutional:      General: She is not in acute distress. Neck:     Vascular: No JVD.  Cardiovascular:     Rate and Rhythm: Normal rate and regular rhythm.     Heart sounds: Normal heart sounds. No murmur heard. Pulmonary:     Effort: Pulmonary effort is normal.     Breath sounds: Normal breath sounds. No wheezing or rales.  Abdominal:     Comments: Colostomy bag, normal appearance  Musculoskeletal:     Right lower leg: No edema.     Left lower leg: No edema.      VISIT DIAGNOSES:   ICD-10-CM   1. Preop cardiovascular exam  Z01.810 EKG 12-Lead    CBC    Comprehensive metabolic panel with GFR    2. Mixed hyperlipidemia  E78.2 Lipid panel    CT CARDIAC SCORING (SELF PAY ONLY)    3. Primary hypertension  I10        Elizabeth Odom is a 83  y.o. female with hypertension, hyperlipidemia, preop evaluation  Assessment & Plan  Preop evaluation: Excellent baseline functional capacity, with no symptoms of angina or dyspnea. Reassuring echocardiogram in 09/2023, performed a day after diagnosis of a small PE. I do not think she needs any repeat cardiac testing prior to surgery. Overall, preoperative cardiac risk is low.  However, her blood pressure is elevated today. I have started her on amlodipine 5 mg daily.  I have encouraged her to check blood pressure regularly at home and keep a blood pressure log. Will see her back in 2 weeks to reassess her blood pressure control. Okay to schedule for surgery tentatively in the second half of May 2025 or later, subject to adequate  blood pressure control Check CBC, CMP today, as part of preop workup.  Hypertension: Management as above  Mixed hyperlipidemia: LDL 128 in 04/2023. Check lipid panel today. She is not keen on starting statin therapy at this time.  Will check CT cardiac scoring scan for risk stratification.  I do not anticipate that this will have any significant change upon my above recommendations for her perioperative cardiac risk.        Meds ordered this encounter  Medications   amLODipine (NORVASC) 5 MG tablet    Sig: Take 1 tablet (5 mg total) by mouth daily.    Dispense:  90 tablet    Refill:  3     F/u in 2 weeks to reassess blood pressure control.  Signed, Cody Das, MD

## 2024-04-08 NOTE — Patient Instructions (Addendum)
 Medications:  START Amlodipine 5 mg take one tablet by mouth daily   Lab Work: Cbc Cmp Lipid panel  If you have labs (blood work) drawn today and your tests are completely normal, you will receive your results only by: [employer] (if you have MyChart) OR A paper copy in the mail If you have any lab test that is abnormal or we need to change your treatment, we will call you to review the results.  Testing/Procedures: Calcium score   CT scanning for a cardiac calcium score (CAT scanning), is a noninvasive, special x-ray that produces cross-sectional images of the body using x-rays and a computer. CT scans help physicians diagnose and treat medical conditions. For some CT exams, a contrast material is used to enhance visibility in the area of the body being studied. CT scans provide greater clarity and reveal more details than regular x-ray exams.   Follow-Up: At [facility], you and your health needs are our priority.  As part of our continuing mission to provide you with exceptional heart care, our providers are all part of one team.  This team includes your primary Cardiologist (physician) and Advanced Practice Providers or APPs (Physician Assistants and Nurse Practitioners) who all work together to provide you with the care you need, when you need it.  Your next appointment:   2 weeks   Provider:   With APP

## 2024-04-09 LAB — COMPREHENSIVE METABOLIC PANEL WITH GFR
ALT: 24 IU/L (ref 0–32)
AST: 26 IU/L (ref 0–40)
Albumin: 4.7 g/dL (ref 3.7–4.7)
Alkaline Phosphatase: 97 IU/L (ref 44–121)
BUN/Creatinine Ratio: 20 (ref 12–28)
BUN: 17 mg/dL (ref 8–27)
Bilirubin Total: 0.9 mg/dL (ref 0.0–1.2)
CO2: 25 mmol/L (ref 20–29)
Calcium: 9.9 mg/dL (ref 8.7–10.3)
Chloride: 98 mmol/L (ref 96–106)
Creatinine, Ser: 0.85 mg/dL (ref 0.57–1.00)
Globulin, Total: 2.9 g/dL (ref 1.5–4.5)
Glucose: 106 mg/dL — ABNORMAL HIGH (ref 70–99)
Potassium: 4.2 mmol/L (ref 3.5–5.2)
Sodium: 141 mmol/L (ref 134–144)
Total Protein: 7.6 g/dL (ref 6.0–8.5)
eGFR: 68 mL/min/{1.73_m2} (ref >59)

## 2024-04-09 LAB — CBC
Hematocrit: 41.9 % (ref 34.0–46.6)
Hemoglobin: 13.7 g/dL (ref 11.1–15.9)
MCH: 30.2 pg (ref 26.6–33.0)
MCHC: 32.7 g/dL (ref 31.5–35.7)
MCV: 92 fL (ref 79–97)
Platelets: 248 10*3/uL (ref 150–450)
RBC: 4.54 x10E6/uL (ref 3.77–5.28)
RDW: 12.7 % (ref 11.7–15.4)
WBC: 5.5 10*3/uL (ref 3.4–10.8)

## 2024-04-09 LAB — LIPID PANEL
Chol/HDL Ratio: 3.3 ratio (ref 0.0–4.4)
Cholesterol, Total: 196 mg/dL (ref 100–199)
HDL: 59 mg/dL (ref >39)
LDL Chol Calc (NIH): 122 mg/dL — ABNORMAL HIGH (ref 0–99)
Triglycerides: 85 mg/dL (ref 0–149)
VLDL Cholesterol Cal: 15 mg/dL (ref 5–40)

## 2024-04-10 NOTE — Progress Notes (Signed)
 Cholesterol remains elevated.  Will follow-up after upcoming CT scan with further recommendations, if you want to hold off starting statin therapy at this time.  Thanks MJP

## 2024-04-14 DIAGNOSIS — Z933 Colostomy status: Secondary | ICD-10-CM | POA: Diagnosis not present

## 2024-04-15 ENCOUNTER — Other Ambulatory Visit: Payer: Self-pay | Admitting: Urology

## 2024-04-24 ENCOUNTER — Telehealth: Payer: Self-pay | Admitting: Internal Medicine

## 2024-04-24 NOTE — Telephone Encounter (Signed)
 Left granddaughter vm to call back.

## 2024-04-29 ENCOUNTER — Ambulatory Visit: Admitting: Student

## 2024-04-30 ENCOUNTER — Ambulatory Visit (HOSPITAL_COMMUNITY)

## 2024-05-03 NOTE — Progress Notes (Signed)
 COVID Vaccine received:  []  No [x]  Yes Date of any COVID positive Test in last 90 days:  none  PCP - [facility], new PCP is being assigned to her instead of Dr. Boston Byers.  Cardiologist - Fransico Ivy, MD cardiac clearance 04-08-24  Epic note  Chest x-ray - 09-20-2023   1v  Epic   CT chest w/ contrast  09-30-2023  Epic EKG -  04-08-2024  Epic Stress Test -  ECHO - 10-01-2023  Epic  Cardiac Cath -  CT Coronary Calcium score:  appt scheduled for 05-14-24  Bowel Prep - []  No  [x]   Yes __CCS PREP____  Pacemaker / ICD device [x]  No []  Yes   Spinal Cord Stimulator:[x]  No []  Yes       History of Sleep Apnea? [x]  No []  Yes   CPAP used?- [x]  No []  Yes    Does the patient monitor blood sugar?   [x]  N/A   []  No []  Yes  Patient has: [x]  NO Hx DM   []  Pre-DM   []  DM1  []   DM2 Last A1c was:        on     MD ordered at PST  Blood Thinner / Instructions:  none Aspirin Instructions:  ASA 81 mg   okay to continue per Dr. Hershell Lose' orders  ERAS Protocol Ordered: []  No  [x]  Yes PRE-SURGERY [x]  ENSURE   X3    []  G2  Patient is to be NPO after: 1030  Dental hx: []  Dentures:  [x]  N/A      []  Bridge or Partial:                   []  Loose or Damaged teeth:   Comments: VM to Rhonda re: booking- [email or web address] and consent orders say [employer] (need wording to match). Patient will sign consents DOS.  Per Sallyanne Creamer, the consent order is correct for [employer]. She will get the booking changed.   Activity level: Able to walk up 2 flights of stairs without becoming significantly short of breath or having chest pain?  []  No   [x]    Yes   Anesthesia review: A.fib-no meds (had ILR but was removed 2019),Hx DVT / PE, PACs, Mild memory loss, HTN, restless leg syn, patient has IVC filter placed 10-05-2023  Patient denies shortness of breath, fever, cough and chest pain at PAT appointment.  Patient was appropriate with her answers to my PST questions. Her granddaughter, Genevive Ket (Her POA) was present and  helped correct a few of her answers (some memory loss). She has been taking all  her medications as directed with no lapses.   Patient verbalized understanding and agreement to the Pre-Surgical Instructions that were given to them at this PAT appointment. Patient was also educated of the need to review these PAT instructions again prior to her surgery.I reviewed the appropriate phone numbers to call if they have any and questions or concerns.

## 2024-05-03 NOTE — Patient Instructions (Signed)
 SURGICAL WAITING ROOM VISITATION Patients having surgery or a procedure may have no more than 2 support people in the waiting area - these visitors may rotate in the visitor waiting room.   If the patient needs to stay at the hospital during part of their recovery, the visitor guidelines for inpatient rooms apply.  PRE-OP VISITATION  Pre-op nurse will coordinate an appropriate time for 1 support person to accompany the patient in pre-op.  This support person may not rotate.  This visitor will be contacted when the time is appropriate for the visitor to come back in the pre-op area.  Please refer to the [facility] website for the visitor guidelines for Inpatients (after your surgery is over and you are in a regular room).  You are not required to quarantine at this time prior to your surgery. However, you must do this: Hand Hygiene often Do NOT share personal items Notify your provider if you are in close contact with someone who has COVID or you develop fever 100.4 or greater, new onset of sneezing, cough, sore throat, shortness of breath or body aches.  If you test positive for Covid or have been in contact with anyone that has tested positive in the last 10 days please notify you surgeon.    Your procedure is scheduled on:    THURSDAY  May 08, 2024  Report to [facility] Main Entrance: [address] entrance where the [employer] is available.   Report to admitting at: 11:15    AM  Call this number if you have any questions or problems the morning of surgery [phone]  FOLLOW ANY ADDITIONAL PRE OP INSTRUCTIONS YOU RECEIVED FROM YOUR SURGEON'S OFFICE!!!  Dulcolax 20 mg (total) - Take 4 (four) of the 5 mg Dulcolax tablets with water at 07:00 am the day prior to surgery.  Miralax  255 g - Mix with 64 oz Gatorade/Powerade.  Starting at 10:00 am ,Drink this gradually over the next few hours (8 oz glass every 15-30 minutes) until gone the day prior to surgery You should finish in 4  hours-6 hours.    Neomycin 1000 mg - At 2 pm, 3 pm and 10 pm after Miralax   bowel prep the day prior to surgery.  Metronidazole 1000 mg - At 2 pm, 3 pm and 10 pm after Miralax  bowel prep the day prior to surgery.   Drink plenty of clear liquids all evening to avoid getting dehydrated.   DRINK two (2) bottles of Pre-Surgery Clear Ensure starting at 6:00 pm the evening prior to your surgery to help prevent dehydration. Increase drinking clear fluids (see list below)          Do not eat food after Midnight the night prior to your surgery/procedure.  After Midnight you may have the following liquids until  10:30 AM  DAY OF SURGERY  Clear Liquid Diet Water Black Coffee (sugar ok, NO MILK/CREAM OR CREAMERS)  Tea (sugar ok, NO MILK/CREAM OR CREAMERS) regular and decaf                             Plain Jell-O  with no fruit (NO RED)                                           Fruit ices (not with fruit pulp, NO RED)  Popsicles (NO RED)                                                                  Juice: NO CITRUS JUICES: only apple, WHITE grape, WHITE cranberry Sports drinks like Gatorade or Powerade (NO RED)                   The day of surgery:  Drink ONE (1) Pre-Surgery Clear Ensure  at  10:30 AM the morning of surgery. Drink in one sitting. Do not sip.  This drink was given to you during your hospital pre-op appointment visit. Nothing else to drink after completing the Pre-Surgery Clear Ensure: No candy, chewing gum or throat lozenges.     Oral Hygiene is also important to reduce your risk of infection.        Remember - BRUSH YOUR TEETH THE MORNING OF SURGERY WITH YOUR REGULAR TOOTHPASTE  Do NOT smoke after Midnight the night before surgery.  STOP TAKING all Vitamins, Herbs and supplements 1 week before your surgery.   Take ONLY these medicines the morning of surgery with A SIP OF WATER: none                    You may not have any metal on  your body including hair pins, jewelry, and body piercing  Do not wear make-up, lotions, powders, perfumes or deodorant  Do not wear nail polish including gel and S&S, artificial / acrylic nails, or any other type of covering on natural nails including finger and toenails. If you have artificial nails, gel coating, etc., that needs to be removed by a nail salon, Please have this removed prior to surgery. Not doing so may mean that your surgery could be cancelled or delayed if the Surgeon or anesthesia staff feels like they are unable to monitor you safely.   Do not shave 48 hours prior to surgery to avoid nicks in your skin which may contribute to postoperative infections.   Contacts, Hearing Aids, dentures or bridgework may not be worn into surgery. DENTURES WILL BE REMOVED PRIOR TO SURGERY PLEASE DO NOT APPLY "Poly grip" OR ADHESIVES!!!  You may bring a small overnight bag with you on the day of surgery, only pack items that are not valuable. [facility] IS NOT RESPONSIBLE   FOR VALUABLES THAT ARE LOST OR STOLEN.   Patients discharged on the day of surgery will not be allowed to drive home.  Someone NEEDS to stay with you for the first 24 hours after anesthesia.  Do not bring your home medications to the hospital. The Pharmacy will dispense medications listed on your medication list to you during your admission in the Hospital.  Special Instructions: Bring a copy of your healthcare power of attorney and living will documents the day of surgery, if you wish to have them scanned into your [facility]- EPIC  Please read over the following fact sheets you were given: IF YOU HAVE QUESTIONS ABOUT YOUR PRE-OP INSTRUCTIONS, PLEASE CALL [phone]   [facility] - Preparing for Surgery Before surgery, you can play an important role.  Because skin is not sterile, your skin needs to be as free of germs as possible.  You can reduce the number of  germs on your skin by washing with CHG  (chlorahexidine gluconate) soap before surgery.  CHG is an antiseptic cleaner which kills germs and bonds with the skin to continue killing germs even after washing. Please DO NOT use if you have an allergy to CHG or antibacterial soaps.  If your skin becomes reddened/irritated stop using the CHG and inform your nurse when you arrive at Short Stay. Do not shave (including legs and underarms) for at least 48 hours prior to the first CHG shower.  You may shave your face/neck.  Please follow these instructions carefully:  1.  Shower with CHG Soap the night before surgery and the  morning of surgery.  2.  If you choose to wash your hair, wash your hair first as usual with your normal  shampoo.  3.  After you shampoo, rinse your hair and body thoroughly to remove the shampoo.                             4.  Use CHG as you would any other liquid soap.  You can apply chg directly to the skin and wash.  Gently with a scrungie or clean washcloth.  5.  Apply the CHG Soap to your body ONLY FROM THE NECK DOWN.   Do not use on face/ open                           Wound or open sores. Avoid contact with eyes, ears mouth and genitals (private parts).                       Wash face,  Genitals (private parts) with your normal soap.             6.  Wash thoroughly, paying special attention to the area where your  surgery  will be performed.  7.  Thoroughly rinse your body with warm water from the neck down.  8.  DO NOT shower/wash with your normal soap after using and rinsing off the CHG Soap.            9.  Pat yourself dry with a clean towel.            10.  Wear clean pajamas.            11.  Place clean sheets on your bed the night of your first shower and do not  sleep with pets.  ON THE DAY OF SURGERY : Do not apply any lotions/deodorants the morning of surgery.  Please wear clean clothes to the hospital/surgery center.     FAILURE TO FOLLOW THESE INSTRUCTIONS MAY RESULT IN THE CANCELLATION OF YOUR  SURGERY  PATIENT SIGNATURE_________________________________  NURSE SIGNATURE__________________________________  ________________________________________________________________________         Elizabeth Odom    An incentive spirometer is a tool that can help keep your lungs clear and active. This tool measures how well you are filling your lungs with each breath. Taking long deep breaths may help reverse or decrease the chance of developing breathing (pulmonary) problems (especially infection) following: A long period of time when you are unable to move or be active. BEFORE THE PROCEDURE  If the spirometer includes an indicator to show your best effort, your nurse or respiratory therapist will set it to a desired goal. If possible, sit up straight or lean slightly forward. Try not to  slouch. Hold the incentive spirometer in an upright position. INSTRUCTIONS FOR USE  Sit on the edge of your bed if possible, or sit up as far as you can in bed or on a chair. Hold the incentive spirometer in an upright position. Breathe out normally. Place the mouthpiece in your mouth and seal your lips tightly around it. Breathe in slowly and as deeply as possible, raising the piston or the ball toward the top of the column. Hold your breath for 3-5 seconds or for as long as possible. Allow the piston or ball to fall to the bottom of the column. Remove the mouthpiece from your mouth and breathe out normally. Rest for a few seconds and repeat Steps 1 through 7 at least 10 times every 1-2 hours when you are awake. Take your time and take a few normal breaths between deep breaths. The spirometer may include an indicator to show your best effort. Use the indicator as a goal to work toward during each repetition. After each set of 10 deep breaths, practice coughing to be sure your lungs are clear. If you have an incision (the cut made at the time of surgery), support your incision when coughing by  placing a pillow or rolled up towels firmly against it. Once you are able to get out of bed, walk around indoors and cough well. You may stop using the incentive spirometer when instructed by your caregiver.  RISKS AND COMPLICATIONS Take your time so you do not get dizzy or light-headed. If you are in pain, you may need to take or ask for pain medication before doing incentive spirometry. It is harder to take a deep breath if you are having pain. AFTER USE Rest and breathe slowly and easily. It can be helpful to keep track of a log of your progress. Your caregiver can provide you with a simple table to help with this. If you are using the spirometer at home, follow these instructions: SEEK MEDICAL CARE IF:  You are having difficultly using the spirometer. You have trouble using the spirometer as often as instructed. Your pain medication is not giving enough relief while using the spirometer. You develop fever of 100.5 F (38.1 C) or higher.                                                                                                    SEEK IMMEDIATE MEDICAL CARE IF:  You cough up bloody sputum that had not been present before. You develop fever of 102 F (38.9 C) or greater. You develop worsening pain at or near the incision site. MAKE SURE YOU:  Understand these instructions. Will watch your condition. Will get help right away if you are not doing well or get worse. Document Released: 04/09/2007 Document Revised: 02/19/2012 Document Reviewed: 06/10/2007 [facility] Patient Information 2014 [place], [place].         WHAT IS A BLOOD TRANSFUSION? Blood Transfusion Information  A transfusion is the replacement of blood or some of its parts. Blood is made up of multiple cells which provide different functions. Red blood cells  carry oxygen and are used for blood loss replacement. White blood cells fight against infection. Platelets control bleeding. Plasma helps clot blood. Other  blood products are available for specialized needs, such as hemophilia or other clotting disorders. BEFORE THE TRANSFUSION  Who gives blood for transfusions?  Healthy volunteers who are fully evaluated to make sure their blood is safe. This is blood bank blood. Transfusion therapy is the safest it has ever been in the practice of medicine. Before blood is taken from a donor, a complete history is taken to make sure that person has no history of diseases nor engages in risky social behavior (examples are intravenous drug use or sexual activity with multiple partners). The donor's travel history is screened to minimize risk of transmitting infections, such as malaria. The donated blood is tested for signs of infectious diseases, such as HIV and hepatitis. The blood is then tested to be sure it is compatible with you in order to minimize the chance of a transfusion reaction. If you or a relative donates blood, this is often done in anticipation of surgery and is not appropriate for emergency situations. It takes many days to process the donated blood. RISKS AND COMPLICATIONS Although transfusion therapy is very safe and saves many lives, the main dangers of transfusion include:  Getting an infectious disease. Developing a transfusion reaction. This is an allergic reaction to something in the blood you were given. Every precaution is taken to prevent this. The decision to have a blood transfusion has been considered carefully by your caregiver before blood is given. Blood is not given unless the benefits outweigh the risks. AFTER THE TRANSFUSION Right after receiving a blood transfusion, you will usually feel much better and more energetic. This is especially true if your red blood cells have gotten low (anemic). The transfusion raises the level of the red blood cells which carry oxygen, and this usually causes an energy increase. The nurse administering the transfusion will monitor you carefully for  complications. HOME CARE INSTRUCTIONS  No special instructions are needed after a transfusion. You may find your energy is better. Speak with your caregiver about any limitations on activity for underlying diseases you may have. SEEK MEDICAL CARE IF:  Your condition is not improving after your transfusion. You develop redness or irritation at the intravenous (IV) site. SEEK IMMEDIATE MEDICAL CARE IF:  Any of the following symptoms occur over the next 12 hours: Shaking chills. You have a temperature by mouth above 102 F (38.9 C), not controlled by medicine. Chest, back, or muscle pain. People around you feel you are not acting correctly or are confused. Shortness of breath or difficulty breathing. Dizziness and fainting. You get a rash or develop hives. You have a decrease in urine output. Your urine turns a dark color or changes to pink, red, or brown. Any of the following symptoms occur over the next 10 days: You have a temperature by mouth above 102 F (38.9 C), not controlled by medicine. Shortness of breath. Weakness after normal activity. The white part of the eye turns yellow (jaundice). You have a decrease in the amount of urine or are urinating less often. Your urine turns a dark color or changes to pink, red, or brown. Document Released: 11/24/2000 Document Revised: 02/19/2012 Document Reviewed: 07/13/2008 [facility] Patient Information 2014 [place], [place].  _______________________________________________________________________

## 2024-05-06 ENCOUNTER — Encounter (HOSPITAL_COMMUNITY): Payer: Self-pay

## 2024-05-06 ENCOUNTER — Other Ambulatory Visit: Payer: Self-pay

## 2024-05-06 ENCOUNTER — Encounter (HOSPITAL_COMMUNITY)
Admission: RE | Admit: 2024-05-06 | Discharge: 2024-05-06 | Disposition: A | Discharge status: Home / Self Care | Source: Ambulatory Visit | Attending: Surgery | Admitting: Surgery

## 2024-05-06 VITALS — BP 146/72 | HR 80 | Temp 98.4°F | Resp 16 | Ht 65.0 in | Wt 127.0 lb

## 2024-05-06 DIAGNOSIS — Z933 Colostomy status: Secondary | ICD-10-CM | POA: Insufficient documentation

## 2024-05-06 DIAGNOSIS — I10 Essential (primary) hypertension: Secondary | ICD-10-CM | POA: Insufficient documentation

## 2024-05-06 DIAGNOSIS — Z86718 Personal history of other venous thrombosis and embolism: Secondary | ICD-10-CM | POA: Insufficient documentation

## 2024-05-06 DIAGNOSIS — Z01818 Encounter for other preprocedural examination: Secondary | ICD-10-CM

## 2024-05-06 DIAGNOSIS — G2581 Restless legs syndrome: Secondary | ICD-10-CM | POA: Insufficient documentation

## 2024-05-06 DIAGNOSIS — K760 Fatty (change of) liver, not elsewhere classified: Secondary | ICD-10-CM

## 2024-05-06 DIAGNOSIS — Z01812 Encounter for preprocedural laboratory examination: Secondary | ICD-10-CM | POA: Insufficient documentation

## 2024-05-06 DIAGNOSIS — Z86711 Personal history of pulmonary embolism: Secondary | ICD-10-CM | POA: Insufficient documentation

## 2024-05-06 DIAGNOSIS — K5730 Diverticulosis of large intestine without perforation or abscess without bleeding: Secondary | ICD-10-CM | POA: Insufficient documentation

## 2024-05-06 DIAGNOSIS — R52 Pain, unspecified: Secondary | ICD-10-CM

## 2024-05-06 DIAGNOSIS — R739 Hyperglycemia, unspecified: Secondary | ICD-10-CM | POA: Insufficient documentation

## 2024-05-06 DIAGNOSIS — G3184 Mild cognitive impairment, so stated: Secondary | ICD-10-CM | POA: Insufficient documentation

## 2024-05-06 HISTORY — DX: Cardiac arrhythmia, unspecified: I49.9

## 2024-05-06 HISTORY — DX: Palmar fascial fibromatosis (dupuytren): M72.0

## 2024-05-06 HISTORY — DX: Mild cognitive impairment of uncertain or unknown etiology: G31.84

## 2024-05-06 HISTORY — DX: Anemia, unspecified: D64.9

## 2024-05-06 HISTORY — DX: Personal history of urinary calculi: Z87.442

## 2024-05-06 HISTORY — DX: Fatty (change of) liver, not elsewhere classified: K76.0

## 2024-05-06 HISTORY — DX: Disease of blood and blood-forming organs, unspecified: D75.9

## 2024-05-06 HISTORY — DX: Claustrophobia: F40.240

## 2024-05-06 LAB — COMPREHENSIVE METABOLIC PANEL WITH GFR
ALT: 21 U/L (ref 0–44)
AST: 26 U/L (ref 15–41)
Albumin: 4.3 g/dL (ref 3.5–5.0)
Alkaline Phosphatase: 91 U/L (ref 38–126)
Anion gap: 11 (ref 5–15)
BUN: 18 mg/dL (ref 8–23)
CO2: 28 mmol/L (ref 22–32)
Calcium: 10 mg/dL (ref 8.9–10.3)
Chloride: 99 mmol/L (ref 98–111)
Creatinine, Ser: 0.86 mg/dL (ref 0.44–1.00)
GFR, Estimated: >60 mL/min (ref >60)
Glucose, Bld: 110 mg/dL — ABNORMAL HIGH (ref 70–99)
Potassium: 3.9 mmol/L (ref 3.5–5.1)
Sodium: 138 mmol/L (ref 135–145)
Total Bilirubin: 0.5 mg/dL (ref 0.0–1.2)
Total Protein: 8.1 g/dL (ref 6.5–8.1)

## 2024-05-06 LAB — CBC
HCT: 42 % (ref 36.0–46.0)
Hemoglobin: 13.8 g/dL (ref 12.0–15.0)
MCH: 30.9 pg (ref 26.0–34.0)
MCHC: 32.9 g/dL (ref 30.0–36.0)
MCV: 94 fL (ref 80.0–100.0)
Platelets: 277 10*3/uL (ref 150–400)
RBC: 4.47 MIL/uL (ref 3.87–5.11)
RDW: 12.7 % (ref 11.5–15.5)
WBC: 5.4 10*3/uL (ref 4.0–10.5)
nRBC: 0 % (ref 0.0–0.2)

## 2024-05-06 LAB — HEMOGLOBIN A1C
Hgb A1c MFr Bld: 5.1 % (ref 4.8–5.6)
Mean Plasma Glucose: 99.67 mg/dL

## 2024-05-07 ENCOUNTER — Encounter (HOSPITAL_COMMUNITY): Payer: Self-pay

## 2024-05-07 NOTE — Anesthesia Preprocedure Evaluation (Signed)
 Anesthesia Evaluation  Patient identified by MRN, date of birth, ID band Patient awake    Reviewed: Allergy & Precautions, NPO status , Patient's Chart, lab work & pertinent test results  Airway Mallampati: II  TM Distance: >3 FB Neck ROM: Full    Dental no notable dental hx.    Pulmonary neg pulmonary ROS   Pulmonary exam normal        Cardiovascular hypertension, Normal cardiovascular exam+ dysrhythmias Atrial Fibrillation      Neuro/Psych  PSYCHIATRIC DISORDERS Anxiety     negative neurological ROS     GI/Hepatic negative GI ROS, Neg liver ROS,,,  Endo/Other  negative endocrine ROS    Renal/GU Renal disease     Musculoskeletal negative musculoskeletal ROS (+)    Abdominal   Peds  Hematology   Anesthesia Other Findings DESIRE FOR OSTOMY TAKEDOWN  Reproductive/Obstetrics                             Anesthesia Physical Anesthesia Plan  ASA: 3  Anesthesia Plan: General   Post-op Pain Management:    Induction: Intravenous  PONV Risk Score and Plan: 3 and Ondansetron , Dexamethasone  and Treatment may vary due to age or medical condition  Airway Management Planned: Oral ETT  Additional Equipment:   Intra-op Plan:   Post-operative Plan: Extubation in OR  Informed Consent: I have reviewed the patients History and Physical, chart, labs and discussed the procedure including the risks, benefits and alternatives for the proposed anesthesia with the patient or authorized representative who has indicated his/her understanding and acceptance.     Dental advisory given  Plan Discussed with: CRNA  Anesthesia Plan Comments: (PAT note from 5/27)        Anesthesia Quick Evaluation

## 2024-05-07 NOTE — Progress Notes (Signed)
 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 PMH of HTN, ?A.fib, hx of DVT/PE s/p IVC filter, mild cognitive impairment, RLS, sigmoid stricture s/p hartmann's (09/2023), anemia, cervical cancer, hx of C-spine fusion and C-spine fx in 2017 treated with C-collar.  Patient follows with Cardiology for hx of syncope. She had a ILR placed in 2017 which was removed in 2019. It showed PACs but no abnormal rhythms noted.  There is also mention of a diagnosis of A.fib however there is no documentation of this in Cardiology notes. She is not anticoagulated. She was last seen in clinic on 04/08/24 for pre op clearance. Her BP was noted to be high and was started on Amlodipine . Otherwise she was cleared for surgery by Dr. Filiberto Hug:  "Excellent baseline functional capacity, with no symptoms of angina or dyspnea. Reassuring echocardiogram in 09/2023, performed a day after diagnosis of a small PE. I do not think she needs any repeat cardiac testing prior to surgery. Overall, preoperative cardiac risk is low."  Pt with hx of DVT and PE in 09/2023 after bowel surgery. She underwent IVC filter placement since patient was having issues with surgical site bleeding and a hematoma.  VS: BP (!) 146/72 Comment: right  arm sitting  Pulse 80   Temp 36.9 C (Oral)   Resp 16   Ht 5\' 5"  (1.651 m)   Wt 57.6 kg   SpO2 97%   BMI 21.13 kg/m   PROVIDERS: Anthon Kins, MD   LABS: Labs reviewed: Acceptable for surgery. (all labs ordered are listed, but only abnormal results are displayed)  Labs Reviewed  COMPREHENSIVE METABOLIC PANEL WITH GFR - Abnormal; Notable for the following components:      Result Value   Glucose, Bld 110 (*)    All other components within normal limits  CBC  HEMOGLOBIN A1C  TYPE AND SCREEN     IMAGES:   EKG 4/29/215:  NSR, rate 72   CV:  Echo 10/01/23:  IMPRESSIONS    1. Left ventricular ejection fraction, by estimation, is 70 to 75%. The left ventricle has hyperdynamic function. The left ventricle has no regional wall motion abnormalities. There is mild concentric left ventricular hypertrophy. Left ventricular diastolic parameters are consistent with Grade I diastolic dysfunction (impaired relaxation).  2. Right ventricular systolic function is normal. The right ventricular size is normal.  3. The mitral valve is normal in structure. Trivial mitral valve regurgitation. No evidence of mitral stenosis.  4. The aortic valve is tricuspid. There is mild calcification of the aortic valve. Aortic valve regurgitation is not visualized. Aortic valve sclerosis/calcification is present, without any evidence of aortic stenosis.  5. The inferior vena cava is normal in size with greater than  50% respiratory variability, suggesting right atrial pressure of 3 mmHg Past Medical History:  Diagnosis Date   A-fib (HCC) 04/09/2016   Acute head injury 04/09/2016   Anemia    Blood dyscrasia    Hx DVT / PE   Cervical cancer (HCC)    Cervical spine fracture, initial encounter 04/09/2016   Claustrophobia    extremely claustrophobic   Dupuytren disease of palm of both hands    Dysrhythmia    PACs,  A.fib   Essential hypertension    Fatty liver    History of anemia     History of kidney stones    MCI (mild cognitive impairment) with memory loss    Multiple fractures of cervical spine (HCC) 04/09/2016   PAC (premature atrial contraction)    Restless leg    Syncope and collapse 04/09/2016    Past Surgical History:  Procedure Laterality Date   APPENDECTOMY     BIOPSY  09/29/2023   Procedure: BIOPSY;  Surgeon: Genell Ken, MD;  [facility]: [facility];  Service: Gastroenterology;;   breast lump removal Left    CERVICAL LAMINECTOMY     COLECTOMY WITH COLOSTOMY CREATION/HARTMANN PROCEDURE N/A 10/01/2023   Procedure: COLECTOMY WITH COLOSTOMY CREATION/HARTMANN PROCEDURE;  Surgeon: Shela Derby, MD;  Location: [facility];  Service: General;  Laterality: N/A;   complete hysterectomy     EP IMPLANTABLE DEVICE N/A 04/10/2016   Procedure: Loop Recorder Insertion;  Surgeon: Tammie Fall, MD;  Location: [facility];  Service: Cardiovascular;  Laterality: N/A;   FLEXIBLE SIGMOIDOSCOPY N/A 09/29/2023   Procedure: FLEXIBLE SIGMOIDOSCOPY;  Surgeon: Genell Ken, MD;  [facility];  Service: Gastroenterology;  Laterality: N/A;   HARVEST BONE GRAFT     IR IVC FILTER PLMT / S&I /IMG GUID/MOD SED  10/05/2023   IR SINUS/FIST TUBE CHK-NON GI  10/22/2023   IR SINUS/FIST TUBE CHK-NON GI  11/05/2023   LOOP RECORDER REMOVAL N/A 07/08/2018   Procedure: LOOP RECORDER REMOVAL;  Surgeon: Tammie Fall, MD;  Location: [facility];  Service: Cardiovascular;  Laterality: N/A;    MEDICATIONS:  amLODipine  (NORVASC ) 5 MG tablet   ascorbic acid  (VITAMIN C ) 250 MG CHEW   aspirin EC 81 MG tablet   cyanocobalamin (VITAMIN B12) 1000 MCG tablet   FIBER GUMMIES PO   Multiple Vitamin (MULTIVITAMIN WITH MINERALS) TABS tablet   Omega-3 Fatty Acids (OMEGA 3 500 PO)   OVER THE COUNTER MEDICATION   No current facility-administered medications for this encounter.   Antoinette Kirschner [facility] Short Stay/Anesthesiology [facility] Phone [phone] 05/07/2024  11:57 AM

## 2024-05-08 ENCOUNTER — Inpatient Hospital Stay (HOSPITAL_COMMUNITY): Payer: Self-pay | Admitting: Anesthesiology

## 2024-05-08 ENCOUNTER — Inpatient Hospital Stay (HOSPITAL_COMMUNITY)
Admission: RE | Admit: 2024-05-08 | Discharge: 2024-06-25 | DRG: 329 | Disposition: A | Discharge status: Hospice | Attending: Surgery | Admitting: Surgery

## 2024-05-08 ENCOUNTER — Encounter (HOSPITAL_COMMUNITY): Payer: Self-pay | Admitting: Surgery

## 2024-05-08 ENCOUNTER — Encounter (HOSPITAL_COMMUNITY)
Admission: RE | Disposition: A | Discharge status: Hospice | Payer: Self-pay | Source: Home / Self Care | Attending: Surgery

## 2024-05-08 ENCOUNTER — Inpatient Hospital Stay (HOSPITAL_COMMUNITY)

## 2024-05-08 ENCOUNTER — Inpatient Hospital Stay (HOSPITAL_COMMUNITY): Payer: Self-pay | Admitting: Medical

## 2024-05-08 ENCOUNTER — Other Ambulatory Visit: Payer: Self-pay

## 2024-05-08 DIAGNOSIS — T81320A Disruption or dehiscence of gastrointestinal tract anastomosis, repair, or closure, initial encounter: Secondary | ICD-10-CM | POA: Diagnosis not present

## 2024-05-08 DIAGNOSIS — N739 Female pelvic inflammatory disease, unspecified: Secondary | ICD-10-CM | POA: Diagnosis not present

## 2024-05-08 DIAGNOSIS — Z981 Arthrodesis status: Secondary | ICD-10-CM

## 2024-05-08 DIAGNOSIS — K632 Fistula of intestine: Secondary | ICD-10-CM | POA: Diagnosis not present

## 2024-05-08 DIAGNOSIS — I700 Atherosclerosis of aorta: Secondary | ICD-10-CM | POA: Diagnosis not present

## 2024-05-08 DIAGNOSIS — B9562 Methicillin resistant Staphylococcus aureus infection as the cause of diseases classified elsewhere: Secondary | ICD-10-CM | POA: Diagnosis not present

## 2024-05-08 DIAGNOSIS — I10 Essential (primary) hypertension: Secondary | ICD-10-CM | POA: Diagnosis not present

## 2024-05-08 DIAGNOSIS — K9189 Other postprocedural complications and disorders of digestive system: Secondary | ICD-10-CM | POA: Diagnosis present

## 2024-05-08 DIAGNOSIS — Z95828 Presence of other vascular implants and grafts: Secondary | ICD-10-CM

## 2024-05-08 DIAGNOSIS — R52 Pain, unspecified: Principal | ICD-10-CM

## 2024-05-08 DIAGNOSIS — R338 Other retention of urine: Secondary | ICD-10-CM | POA: Diagnosis present

## 2024-05-08 DIAGNOSIS — R4589 Other symptoms and signs involving emotional state: Secondary | ICD-10-CM | POA: Diagnosis not present

## 2024-05-08 DIAGNOSIS — R935 Abnormal findings on diagnostic imaging of other abdominal regions, including retroperitoneum: Secondary | ICD-10-CM | POA: Diagnosis not present

## 2024-05-08 DIAGNOSIS — D62 Acute posthemorrhagic anemia: Secondary | ICD-10-CM | POA: Diagnosis present

## 2024-05-08 DIAGNOSIS — R1313 Dysphagia, pharyngeal phase: Secondary | ICD-10-CM | POA: Diagnosis present

## 2024-05-08 DIAGNOSIS — Z433 Encounter for attention to colostomy: Secondary | ICD-10-CM | POA: Diagnosis not present

## 2024-05-08 DIAGNOSIS — S3692XA Contusion of unspecified intra-abdominal organ, initial encounter: Secondary | ICD-10-CM | POA: Diagnosis present

## 2024-05-08 DIAGNOSIS — R6339 Other feeding difficulties: Secondary | ICD-10-CM | POA: Diagnosis not present

## 2024-05-08 DIAGNOSIS — J90 Pleural effusion, not elsewhere classified: Secondary | ICD-10-CM | POA: Diagnosis present

## 2024-05-08 DIAGNOSIS — R0600 Dyspnea, unspecified: Secondary | ICD-10-CM | POA: Diagnosis not present

## 2024-05-08 DIAGNOSIS — T8143XA Infection following a procedure, organ and space surgical site, initial encounter: Secondary | ICD-10-CM | POA: Diagnosis present

## 2024-05-08 DIAGNOSIS — Z833 Family history of diabetes mellitus: Secondary | ICD-10-CM

## 2024-05-08 DIAGNOSIS — K5790 Diverticulosis of intestine, part unspecified, without perforation or abscess without bleeding: Principal | ICD-10-CM | POA: Diagnosis present

## 2024-05-08 DIAGNOSIS — Z9889 Other specified postprocedural states: Secondary | ICD-10-CM | POA: Diagnosis not present

## 2024-05-08 DIAGNOSIS — D72829 Elevated white blood cell count, unspecified: Secondary | ICD-10-CM | POA: Diagnosis not present

## 2024-05-08 DIAGNOSIS — K5721 Diverticulitis of large intestine with perforation and abscess with bleeding: Principal | ICD-10-CM | POA: Diagnosis present

## 2024-05-08 DIAGNOSIS — J9811 Atelectasis: Secondary | ICD-10-CM | POA: Diagnosis present

## 2024-05-08 DIAGNOSIS — R7303 Prediabetes: Secondary | ICD-10-CM | POA: Diagnosis not present

## 2024-05-08 DIAGNOSIS — N200 Calculus of kidney: Secondary | ICD-10-CM | POA: Diagnosis not present

## 2024-05-08 DIAGNOSIS — K285 Chronic or unspecified gastrojejunal ulcer with perforation: Secondary | ICD-10-CM | POA: Diagnosis present

## 2024-05-08 DIAGNOSIS — K56699 Other intestinal obstruction unspecified as to partial versus complete obstruction: Secondary | ICD-10-CM | POA: Diagnosis present

## 2024-05-08 DIAGNOSIS — R54 Age-related physical debility: Secondary | ICD-10-CM | POA: Diagnosis present

## 2024-05-08 DIAGNOSIS — E871 Hypo-osmolality and hyponatremia: Secondary | ICD-10-CM | POA: Diagnosis present

## 2024-05-08 DIAGNOSIS — G2581 Restless legs syndrome: Secondary | ICD-10-CM | POA: Diagnosis not present

## 2024-05-08 DIAGNOSIS — E43 Unspecified severe protein-calorie malnutrition: Secondary | ICD-10-CM | POA: Diagnosis not present

## 2024-05-08 DIAGNOSIS — I493 Ventricular premature depolarization: Secondary | ICD-10-CM | POA: Diagnosis present

## 2024-05-08 DIAGNOSIS — Z66 Do not resuscitate: Secondary | ICD-10-CM | POA: Diagnosis not present

## 2024-05-08 DIAGNOSIS — Z79899 Other long term (current) drug therapy: Secondary | ICD-10-CM | POA: Diagnosis not present

## 2024-05-08 DIAGNOSIS — Z86718 Personal history of other venous thrombosis and embolism: Secondary | ICD-10-CM

## 2024-05-08 DIAGNOSIS — Z7189 Other specified counseling: Secondary | ICD-10-CM | POA: Diagnosis not present

## 2024-05-08 DIAGNOSIS — R32 Unspecified urinary incontinence: Secondary | ICD-10-CM | POA: Diagnosis present

## 2024-05-08 DIAGNOSIS — I4891 Unspecified atrial fibrillation: Secondary | ICD-10-CM | POA: Diagnosis not present

## 2024-05-08 DIAGNOSIS — Z4682 Encounter for fitting and adjustment of non-vascular catheter: Secondary | ICD-10-CM | POA: Diagnosis not present

## 2024-05-08 DIAGNOSIS — Z408 Encounter for other prophylactic surgery: Secondary | ICD-10-CM | POA: Diagnosis not present

## 2024-05-08 DIAGNOSIS — Z809 Family history of malignant neoplasm, unspecified: Secondary | ICD-10-CM

## 2024-05-08 DIAGNOSIS — E8770 Fluid overload, unspecified: Secondary | ICD-10-CM | POA: Diagnosis present

## 2024-05-08 DIAGNOSIS — Z90710 Acquired absence of both cervix and uterus: Secondary | ICD-10-CM

## 2024-05-08 DIAGNOSIS — K660 Peritoneal adhesions (postprocedural) (postinfection): Secondary | ICD-10-CM | POA: Diagnosis present

## 2024-05-08 DIAGNOSIS — R627 Adult failure to thrive: Secondary | ICD-10-CM | POA: Diagnosis present

## 2024-05-08 DIAGNOSIS — R200 Anesthesia of skin: Secondary | ICD-10-CM | POA: Diagnosis not present

## 2024-05-08 DIAGNOSIS — R0902 Hypoxemia: Secondary | ICD-10-CM | POA: Diagnosis present

## 2024-05-08 DIAGNOSIS — B9689 Other specified bacterial agents as the cause of diseases classified elsewhere: Secondary | ICD-10-CM | POA: Diagnosis not present

## 2024-05-08 DIAGNOSIS — I82442 Acute embolism and thrombosis of left tibial vein: Secondary | ICD-10-CM | POA: Diagnosis not present

## 2024-05-08 DIAGNOSIS — T8110XA Postprocedural shock unspecified, initial encounter: Secondary | ICD-10-CM

## 2024-05-08 DIAGNOSIS — G9341 Metabolic encephalopathy: Secondary | ICD-10-CM | POA: Diagnosis present

## 2024-05-08 DIAGNOSIS — K30 Functional dyspepsia: Secondary | ICD-10-CM | POA: Diagnosis not present

## 2024-05-08 DIAGNOSIS — J439 Emphysema, unspecified: Secondary | ICD-10-CM | POA: Diagnosis not present

## 2024-05-08 DIAGNOSIS — B965 Pseudomonas (aeruginosa) (mallei) (pseudomallei) as the cause of diseases classified elsewhere: Secondary | ICD-10-CM | POA: Diagnosis not present

## 2024-05-08 DIAGNOSIS — R197 Diarrhea, unspecified: Secondary | ICD-10-CM | POA: Diagnosis not present

## 2024-05-08 DIAGNOSIS — K760 Fatty (change of) liver, not elsewhere classified: Secondary | ICD-10-CM | POA: Diagnosis not present

## 2024-05-08 DIAGNOSIS — B379 Candidiasis, unspecified: Secondary | ICD-10-CM | POA: Diagnosis not present

## 2024-05-08 DIAGNOSIS — K624 Stenosis of anus and rectum: Secondary | ICD-10-CM | POA: Diagnosis present

## 2024-05-08 DIAGNOSIS — R21 Rash and other nonspecific skin eruption: Secondary | ICD-10-CM | POA: Diagnosis not present

## 2024-05-08 DIAGNOSIS — D75839 Thrombocytosis, unspecified: Secondary | ICD-10-CM | POA: Diagnosis present

## 2024-05-08 DIAGNOSIS — Z48813 Encounter for surgical aftercare following surgery on the respiratory system: Secondary | ICD-10-CM | POA: Diagnosis not present

## 2024-05-08 DIAGNOSIS — D638 Anemia in other chronic diseases classified elsewhere: Secondary | ICD-10-CM | POA: Diagnosis not present

## 2024-05-08 DIAGNOSIS — J95821 Acute postprocedural respiratory failure: Secondary | ICD-10-CM | POA: Diagnosis present

## 2024-05-08 DIAGNOSIS — K651 Peritoneal abscess: Secondary | ICD-10-CM | POA: Diagnosis present

## 2024-05-08 DIAGNOSIS — I4819 Other persistent atrial fibrillation: Secondary | ICD-10-CM | POA: Diagnosis not present

## 2024-05-08 DIAGNOSIS — I959 Hypotension, unspecified: Secondary | ICD-10-CM | POA: Diagnosis present

## 2024-05-08 DIAGNOSIS — R159 Full incontinence of feces: Secondary | ICD-10-CM | POA: Diagnosis present

## 2024-05-08 DIAGNOSIS — E869 Volume depletion, unspecified: Secondary | ICD-10-CM | POA: Diagnosis present

## 2024-05-08 DIAGNOSIS — M7989 Other specified soft tissue disorders: Secondary | ICD-10-CM | POA: Diagnosis not present

## 2024-05-08 DIAGNOSIS — R7989 Other specified abnormal findings of blood chemistry: Secondary | ICD-10-CM | POA: Diagnosis present

## 2024-05-08 DIAGNOSIS — Z86711 Personal history of pulmonary embolism: Secondary | ICD-10-CM | POA: Diagnosis not present

## 2024-05-08 DIAGNOSIS — R188 Other ascites: Secondary | ICD-10-CM | POA: Diagnosis not present

## 2024-05-08 DIAGNOSIS — K286 Chronic or unspecified gastrojejunal ulcer with both hemorrhage and perforation: Secondary | ICD-10-CM | POA: Diagnosis not present

## 2024-05-08 DIAGNOSIS — Z931 Gastrostomy status: Secondary | ICD-10-CM | POA: Diagnosis not present

## 2024-05-08 DIAGNOSIS — Z7982 Long term (current) use of aspirin: Secondary | ICD-10-CM

## 2024-05-08 DIAGNOSIS — Z8349 Family history of other endocrine, nutritional and metabolic diseases: Secondary | ICD-10-CM

## 2024-05-08 DIAGNOSIS — L259 Unspecified contact dermatitis, unspecified cause: Secondary | ICD-10-CM | POA: Diagnosis not present

## 2024-05-08 DIAGNOSIS — K567 Ileus, unspecified: Secondary | ICD-10-CM | POA: Diagnosis not present

## 2024-05-08 DIAGNOSIS — Z7901 Long term (current) use of anticoagulants: Secondary | ICD-10-CM

## 2024-05-08 DIAGNOSIS — R000 Tachycardia, unspecified: Secondary | ICD-10-CM | POA: Diagnosis not present

## 2024-05-08 DIAGNOSIS — K5939 Other megacolon: Secondary | ICD-10-CM | POA: Diagnosis present

## 2024-05-08 DIAGNOSIS — Z6821 Body mass index (BMI) 21.0-21.9, adult: Secondary | ICD-10-CM

## 2024-05-08 DIAGNOSIS — E876 Hypokalemia: Secondary | ICD-10-CM | POA: Diagnosis present

## 2024-05-08 DIAGNOSIS — K625 Hemorrhage of anus and rectum: Secondary | ICD-10-CM | POA: Diagnosis not present

## 2024-05-08 DIAGNOSIS — Z5320 Procedure and treatment not carried out because of patient's decision for unspecified reasons: Secondary | ICD-10-CM | POA: Diagnosis present

## 2024-05-08 DIAGNOSIS — R109 Unspecified abdominal pain: Secondary | ICD-10-CM | POA: Diagnosis not present

## 2024-05-08 DIAGNOSIS — Z5331 Laparoscopic surgical procedure converted to open procedure: Secondary | ICD-10-CM

## 2024-05-08 DIAGNOSIS — T8119XS Other postprocedural shock, sequela: Secondary | ICD-10-CM | POA: Diagnosis not present

## 2024-05-08 DIAGNOSIS — K56609 Unspecified intestinal obstruction, unspecified as to partial versus complete obstruction: Secondary | ICD-10-CM | POA: Diagnosis not present

## 2024-05-08 DIAGNOSIS — I480 Paroxysmal atrial fibrillation: Secondary | ICD-10-CM | POA: Diagnosis not present

## 2024-05-08 DIAGNOSIS — I4892 Unspecified atrial flutter: Secondary | ICD-10-CM | POA: Diagnosis present

## 2024-05-08 DIAGNOSIS — K5730 Diverticulosis of large intestine without perforation or abscess without bleeding: Secondary | ICD-10-CM

## 2024-05-08 DIAGNOSIS — R55 Syncope and collapse: Secondary | ICD-10-CM | POA: Diagnosis not present

## 2024-05-08 DIAGNOSIS — N179 Acute kidney failure, unspecified: Secondary | ICD-10-CM | POA: Diagnosis not present

## 2024-05-08 DIAGNOSIS — R4020 Unspecified coma: Secondary | ICD-10-CM | POA: Diagnosis not present

## 2024-05-08 DIAGNOSIS — R64 Cachexia: Secondary | ICD-10-CM | POA: Diagnosis present

## 2024-05-08 DIAGNOSIS — Z452 Encounter for adjustment and management of vascular access device: Secondary | ICD-10-CM | POA: Diagnosis not present

## 2024-05-08 DIAGNOSIS — N736 Female pelvic peritoneal adhesions (postinfective): Secondary | ICD-10-CM | POA: Diagnosis present

## 2024-05-08 DIAGNOSIS — E782 Mixed hyperlipidemia: Secondary | ICD-10-CM | POA: Diagnosis not present

## 2024-05-08 DIAGNOSIS — Z515 Encounter for palliative care: Secondary | ICD-10-CM | POA: Diagnosis not present

## 2024-05-08 DIAGNOSIS — N281 Cyst of kidney, acquired: Secondary | ICD-10-CM | POA: Diagnosis not present

## 2024-05-08 DIAGNOSIS — K565 Intestinal adhesions [bands], unspecified as to partial versus complete obstruction: Secondary | ICD-10-CM

## 2024-05-08 DIAGNOSIS — J189 Pneumonia, unspecified organism: Secondary | ICD-10-CM | POA: Diagnosis not present

## 2024-05-08 DIAGNOSIS — F419 Anxiety disorder, unspecified: Secondary | ICD-10-CM | POA: Diagnosis not present

## 2024-05-08 DIAGNOSIS — Z823 Family history of stroke: Secondary | ICD-10-CM

## 2024-05-08 DIAGNOSIS — F05 Delirium due to known physiological condition: Secondary | ICD-10-CM | POA: Diagnosis not present

## 2024-05-08 DIAGNOSIS — Z87442 Personal history of urinary calculi: Secondary | ICD-10-CM

## 2024-05-08 DIAGNOSIS — E8809 Other disorders of plasma-protein metabolism, not elsewhere classified: Secondary | ICD-10-CM | POA: Diagnosis present

## 2024-05-08 DIAGNOSIS — B952 Enterococcus as the cause of diseases classified elsewhere: Secondary | ICD-10-CM | POA: Diagnosis not present

## 2024-05-08 DIAGNOSIS — R339 Retention of urine, unspecified: Secondary | ICD-10-CM | POA: Diagnosis present

## 2024-05-08 DIAGNOSIS — Z862 Personal history of diseases of the blood and blood-forming organs and certain disorders involving the immune mechanism: Secondary | ICD-10-CM | POA: Insufficient documentation

## 2024-05-08 DIAGNOSIS — R918 Other nonspecific abnormal finding of lung field: Secondary | ICD-10-CM | POA: Diagnosis not present

## 2024-05-08 HISTORY — PX: FLEXIBLE SIGMOIDOSCOPY: SHX5431

## 2024-05-08 HISTORY — PX: CYSTOSCOPY WITH INDOCYANINE GREEN IMAGING (ICG): SHX7549

## 2024-05-08 HISTORY — PX: LAPAROSCOPIC LYSIS OF ADHESIONS: SHX5905

## 2024-05-08 HISTORY — PX: XI ROBOTIC ASSISTED COLOSTOMY TAKEDOWN: SHX6828

## 2024-05-08 LAB — TYPE AND SCREEN
ABO/RH(D): A POS
Antibody Screen: NEGATIVE

## 2024-05-08 SURGERY — CLOSURE, COLOSTOMY, ROBOT-ASSISTED
Anesthesia: General

## 2024-05-08 MED ORDER — ROCURONIUM BROMIDE 10 MG/ML (PF) SYRINGE
PREFILLED_SYRINGE | INTRAVENOUS | Status: AC
  Filled 2024-05-08: qty 10

## 2024-05-08 MED ORDER — ONDANSETRON HCL 4 MG PO TABS: 4.0000 mg | ORAL_TABLET | Freq: Four times a day (QID) | ORAL | Status: DC | PRN

## 2024-05-08 MED ORDER — MELATONIN 3 MG PO TABS
3.0000 mg | ORAL_TABLET | Freq: Every evening | ORAL | Status: DC | PRN
Start: 2024-05-08 — End: 2024-05-12

## 2024-05-08 MED ORDER — BUPIVACAINE-EPINEPHRINE (PF) 0.25% -1:200000 IJ SOLN
INTRAMUSCULAR | Status: DC | PRN
  Administered 2024-05-08: 30 mL

## 2024-05-08 MED ORDER — BUPIVACAINE LIPOSOME 1.3 % IJ SUSP
INTRAMUSCULAR | Status: AC
  Filled 2024-05-08: qty 20

## 2024-05-08 MED ORDER — PHENOL 1.4 % MT LIQD: 2.0000 | OROMUCOSAL | Status: DC | PRN

## 2024-05-08 MED ORDER — GABAPENTIN 300 MG PO CAPS
300.0000 mg | ORAL_CAPSULE | ORAL | Status: DC
  Filled 2024-05-08: qty 1

## 2024-05-08 MED ORDER — SODIUM CHLORIDE 0.9% FLUSH: 3.0000 mL | INTRAVENOUS | Status: DC | PRN

## 2024-05-08 MED ORDER — VITAMIN C 500 MG PO TABS
250.0000 mg | ORAL_TABLET | Freq: Every day | ORAL | Status: DC
  Administered 2024-05-09 – 2024-05-11 (×3): 250 mg via ORAL
  Filled 2024-05-08 (×3): qty 1

## 2024-05-08 MED ORDER — FENTANYL CITRATE (PF) 100 MCG/2ML IJ SOLN
INTRAMUSCULAR | Status: DC | PRN
Start: 2024-05-08 — End: 2024-05-08
  Administered 2024-05-08: 100 ug via INTRAVENOUS

## 2024-05-08 MED ORDER — ROCURONIUM BROMIDE 100 MG/10ML IV SOLN
INTRAVENOUS | Status: DC | PRN
  Administered 2024-05-08 (×2): 20 mg via INTRAVENOUS
  Administered 2024-05-08: 40 mg via INTRAVENOUS

## 2024-05-08 MED ORDER — EPHEDRINE 5 MG/ML INJ
INTRAVENOUS | Status: AC
  Filled 2024-05-08: qty 10

## 2024-05-08 MED ORDER — FENTANYL CITRATE PF 50 MCG/ML IJ SOSY
25.0000 ug | PREFILLED_SYRINGE | INTRAMUSCULAR | Status: DC | PRN
  Administered 2024-05-08: 25 ug via INTRAVENOUS

## 2024-05-08 MED ORDER — CHLORHEXIDINE GLUCONATE 0.12 % MT SOLN
15.0000 mL | Freq: Once | OROMUCOSAL | Status: AC
  Administered 2024-05-08: 15 mL via OROMUCOSAL

## 2024-05-08 MED ORDER — NAPHAZOLINE-GLYCERIN 0.012-0.25 % OP SOLN: 1.0000 [drp] | Freq: Four times a day (QID) | OPHTHALMIC | Status: DC | PRN

## 2024-05-08 MED ORDER — HYDROMORPHONE HCL 1 MG/ML IJ SOLN
0.5000 mg | INTRAMUSCULAR | Status: DC | PRN
  Administered 2024-05-08: 1 mg via INTRAVENOUS
  Administered 2024-05-09: 0.5 mg via INTRAVENOUS
  Administered 2024-05-09 – 2024-05-10 (×3): 1 mg via INTRAVENOUS
  Administered 2024-05-11 (×2): 2 mg via INTRAVENOUS
  Filled 2024-05-08: qty 2
  Filled 2024-05-08 (×3): qty 1
  Filled 2024-05-08: qty 2
  Filled 2024-05-08 (×2): qty 1

## 2024-05-08 MED ORDER — DIPHENHYDRAMINE HCL 50 MG/ML IJ SOLN: 12.5000 mg | Freq: Four times a day (QID) | INTRAMUSCULAR | Status: DC | PRN

## 2024-05-08 MED ORDER — PROPOFOL 10 MG/ML IV BOLUS
INTRAVENOUS | Status: DC | PRN
  Administered 2024-05-08: 100 mg via INTRAVENOUS

## 2024-05-08 MED ORDER — SODIUM CHLORIDE 0.9 % IV SOLN: 250.0000 mL | INTRAVENOUS | Status: DC | PRN

## 2024-05-08 MED ORDER — DEXAMETHASONE SODIUM PHOSPHATE 10 MG/ML IJ SOLN
INTRAMUSCULAR | Status: AC
  Filled 2024-05-08: qty 1

## 2024-05-08 MED ORDER — SALINE SPRAY 0.65 % NA SOLN: 1.0000 | Freq: Four times a day (QID) | NASAL | Status: DC | PRN

## 2024-05-08 MED ORDER — CHLORHEXIDINE GLUCONATE CLOTH 2 % EX PADS: 6.0000 | MEDICATED_PAD | Freq: Once | CUTANEOUS | Status: DC

## 2024-05-08 MED ORDER — LACTATED RINGERS IV SOLN: INTRAVENOUS | Status: DC

## 2024-05-08 MED ORDER — ASPIRIN 81 MG PO TBEC
81.0000 mg | DELAYED_RELEASE_TABLET | Freq: Every day | ORAL | Status: DC
  Administered 2024-05-09 – 2024-05-16 (×7): 81 mg via ORAL
  Filled 2024-05-08 (×7): qty 1

## 2024-05-08 MED ORDER — DEXAMETHASONE SODIUM PHOSPHATE 10 MG/ML IJ SOLN
INTRAMUSCULAR | Status: DC | PRN
Start: 2024-05-08 — End: 2024-05-08
  Administered 2024-05-08: 10 mg via INTRAVENOUS

## 2024-05-08 MED ORDER — ENSURE PRE-SURGERY PO LIQD: 296.0000 mL | Freq: Once | ORAL | Status: DC

## 2024-05-08 MED ORDER — BISACODYL 5 MG PO TBEC: 20.0000 mg | DELAYED_RELEASE_TABLET | Freq: Once | ORAL | Status: DC

## 2024-05-08 MED ORDER — CALCIUM POLYCARBOPHIL 625 MG PO TABS
625.0000 mg | ORAL_TABLET | Freq: Two times a day (BID) | ORAL | Status: DC
  Administered 2024-05-09 (×2): 625 mg via ORAL
  Filled 2024-05-08 (×3): qty 1

## 2024-05-08 MED ORDER — LIDOCAINE HCL (PF) 2 % IJ SOLN
INTRAMUSCULAR | Status: AC
  Filled 2024-05-08: qty 5

## 2024-05-08 MED ORDER — FENTANYL CITRATE (PF) 100 MCG/2ML IJ SOLN
INTRAMUSCULAR | Status: AC
  Filled 2024-05-08: qty 2

## 2024-05-08 MED ORDER — HYDRALAZINE HCL 20 MG/ML IJ SOLN: 10.0000 mg | INTRAMUSCULAR | Status: DC | PRN

## 2024-05-08 MED ORDER — POLYETHYLENE GLYCOL 3350 17 GM/SCOOP PO POWD: 238.0000 g | Freq: Once | ORAL | Status: DC

## 2024-05-08 MED ORDER — EPHEDRINE SULFATE (PRESSORS) 50 MG/ML IJ SOLN
INTRAMUSCULAR | Status: DC | PRN
Start: 2024-05-08 — End: 2024-05-08
  Administered 2024-05-08: 7.5 mg via INTRAVENOUS

## 2024-05-08 MED ORDER — SODIUM CHLORIDE 0.9 % IV SOLN
2.0000 g | Freq: Two times a day (BID) | INTRAVENOUS | Status: AC
  Administered 2024-05-08: 2 g via INTRAVENOUS
  Filled 2024-05-08: qty 2

## 2024-05-08 MED ORDER — ORAL CARE MOUTH RINSE: 15.0000 mL | Freq: Once | OROMUCOSAL | Status: AC

## 2024-05-08 MED ORDER — SIMETHICONE 80 MG PO CHEW
40.0000 mg | CHEWABLE_TABLET | Freq: Four times a day (QID) | ORAL | Status: DC | PRN
  Administered 2024-05-09 – 2024-05-11 (×2): 40 mg via ORAL
  Filled 2024-05-08 (×2): qty 1

## 2024-05-08 MED ORDER — ACETAMINOPHEN 500 MG PO TABS
1000.0000 mg | ORAL_TABLET | Freq: Four times a day (QID) | ORAL | Status: DC
Start: 2024-05-08 — End: 2024-05-12
  Administered 2024-05-09 (×3): 1000 mg via ORAL
  Administered 2024-05-09: 500 mg via ORAL
  Administered 2024-05-10: 1000 mg via ORAL
  Administered 2024-05-10: 750 mg via ORAL
  Administered 2024-05-11 – 2024-05-12 (×3): 1000 mg via ORAL
  Filled 2024-05-08 (×9): qty 2

## 2024-05-08 MED ORDER — ALVIMOPAN 12 MG PO CAPS
12.0000 mg | ORAL_CAPSULE | ORAL | Status: AC
  Administered 2024-05-08: 12 mg via ORAL
  Filled 2024-05-08: qty 1

## 2024-05-08 MED ORDER — TRAMADOL HCL 50 MG PO TABS
50.0000 mg | ORAL_TABLET | Freq: Three times a day (TID) | ORAL | Status: DC | PRN
  Administered 2024-05-09: 50 mg via ORAL
  Administered 2024-05-09 (×2): 100 mg via ORAL
  Filled 2024-05-08 (×2): qty 2
  Filled 2024-05-08: qty 1
  Filled 2024-05-08: qty 2

## 2024-05-08 MED ORDER — BUPIVACAINE LIPOSOME 1.3 % IJ SUSP
INTRAMUSCULAR | Status: DC | PRN
Start: 2024-05-08 — End: 2024-05-08
  Administered 2024-05-08: 20 mL

## 2024-05-08 MED ORDER — FENTANYL CITRATE PF 50 MCG/ML IJ SOSY
PREFILLED_SYRINGE | INTRAMUSCULAR | Status: AC
Start: 2024-05-08 — End: ?
  Filled 2024-05-08: qty 1

## 2024-05-08 MED ORDER — METOPROLOL TARTRATE 5 MG/5ML IV SOLN
5.0000 mg | Freq: Four times a day (QID) | INTRAVENOUS | Status: DC | PRN
  Filled 2024-05-08: qty 5

## 2024-05-08 MED ORDER — PROCHLORPERAZINE MALEATE 10 MG PO TABS: 10.0000 mg | ORAL_TABLET | Freq: Four times a day (QID) | ORAL | Status: DC | PRN

## 2024-05-08 MED ORDER — KCL IN DEXTROSE-NACL 20-5-0.45 MEQ/L-%-% IV SOLN
INTRAVENOUS | Status: AC
  Filled 2024-05-08 (×3): qty 1000

## 2024-05-08 MED ORDER — NEOMYCIN SULFATE 500 MG PO TABS: 1000.0000 mg | ORAL_TABLET | ORAL | Status: DC

## 2024-05-08 MED ORDER — ONDANSETRON HCL 4 MG/2ML IJ SOLN
INTRAMUSCULAR | Status: AC
  Filled 2024-05-08: qty 2

## 2024-05-08 MED ORDER — MAGIC MOUTHWASH: 15.0000 mL | Freq: Four times a day (QID) | ORAL | Status: DC | PRN

## 2024-05-08 MED ORDER — STERILE WATER FOR INJECTION IJ SOLN
INTRAMUSCULAR | Status: DC | PRN
  Administered 2024-05-08: 20 mL via INTRAMUSCULAR

## 2024-05-08 MED ORDER — ALUM & MAG HYDROXIDE-SIMETH 200-200-20 MG/5ML PO SUSP
30.0000 mL | Freq: Four times a day (QID) | ORAL | Status: DC | PRN
  Administered 2024-05-13: 30 mL via ORAL
  Filled 2024-05-08: qty 30

## 2024-05-08 MED ORDER — LIDOCAINE HCL (CARDIAC) PF 100 MG/5ML IV SOSY
PREFILLED_SYRINGE | INTRAVENOUS | Status: DC | PRN
  Administered 2024-05-08: 60 mg via INTRAVENOUS

## 2024-05-08 MED ORDER — ENOXAPARIN SODIUM 40 MG/0.4ML IJ SOSY
40.0000 mg | PREFILLED_SYRINGE | Freq: Once | INTRAMUSCULAR | Status: AC
  Administered 2024-05-08: 40 mg via SUBCUTANEOUS
  Filled 2024-05-08: qty 0.4

## 2024-05-08 MED ORDER — METHOCARBAMOL 1000 MG/10ML IJ SOLN
500.0000 mg | Freq: Three times a day (TID) | INTRAMUSCULAR | Status: DC
  Administered 2024-05-09 – 2024-05-15 (×19): 500 mg via INTRAVENOUS
  Filled 2024-05-08 (×19): qty 10

## 2024-05-08 MED ORDER — TRAMADOL HCL 50 MG PO TABS: 50.0000 mg | ORAL_TABLET | Freq: Four times a day (QID) | ORAL | 0 refills | Status: AC | PRN

## 2024-05-08 MED ORDER — METRONIDAZOLE 500 MG PO TABS: 1000.0000 mg | ORAL_TABLET | ORAL | Status: DC

## 2024-05-08 MED ORDER — GABAPENTIN 100 MG PO CAPS
200.0000 mg | ORAL_CAPSULE | Freq: Every day | ORAL | Status: DC
  Filled 2024-05-08: qty 2

## 2024-05-08 MED ORDER — ONDANSETRON HCL 4 MG/2ML IJ SOLN
4.0000 mg | Freq: Four times a day (QID) | INTRAMUSCULAR | Status: DC | PRN
  Administered 2024-05-08 – 2024-05-16 (×6): 4 mg via INTRAVENOUS
  Filled 2024-05-08 (×5): qty 2

## 2024-05-08 MED ORDER — ENOXAPARIN SODIUM 40 MG/0.4ML IJ SOSY
40.0000 mg | PREFILLED_SYRINGE | INTRAMUSCULAR | Status: DC
  Administered 2024-05-09 – 2024-05-11 (×3): 40 mg via SUBCUTANEOUS
  Filled 2024-05-08 (×3): qty 0.4

## 2024-05-08 MED ORDER — AMISULPRIDE (ANTIEMETIC) 5 MG/2ML IV SOLN
INTRAVENOUS | Status: AC
  Filled 2024-05-08: qty 4

## 2024-05-08 MED ORDER — STERILE WATER FOR INJECTION IJ SOLN
INTRAMUSCULAR | Status: AC
  Filled 2024-05-08: qty 10

## 2024-05-08 MED ORDER — LACTATED RINGERS IV SOLN: INTRAVENOUS | Status: DC | PRN

## 2024-05-08 MED ORDER — ALVIMOPAN 12 MG PO CAPS
12.0000 mg | ORAL_CAPSULE | Freq: Two times a day (BID) | ORAL | Status: DC
Start: 2024-05-09 — End: 2024-05-13
  Administered 2024-05-09 – 2024-05-11 (×5): 12 mg via ORAL
  Filled 2024-05-08 (×9): qty 1

## 2024-05-08 MED ORDER — MENTHOL 3 MG MT LOZG: 1.0000 | LOZENGE | OROMUCOSAL | Status: DC | PRN

## 2024-05-08 MED ORDER — DIPHENHYDRAMINE HCL 12.5 MG/5ML PO ELIX: 12.5000 mg | ORAL_SOLUTION | Freq: Four times a day (QID) | ORAL | Status: DC | PRN

## 2024-05-08 MED ORDER — LACTATED RINGERS IV BOLUS: 1000.0000 mL | Freq: Three times a day (TID) | INTRAVENOUS | Status: AC | PRN

## 2024-05-08 MED ORDER — ENSURE PRE-SURGERY PO LIQD: 592.0000 mL | Freq: Once | ORAL | Status: DC

## 2024-05-08 MED ORDER — INDOCYANINE GREEN 25 MG IV SOLR
INTRAVENOUS | Status: DC | PRN
Start: 2024-05-08 — End: 2024-05-08
  Administered 2024-05-08: 5 mg via INTRAVENOUS

## 2024-05-08 MED ORDER — BUPIVACAINE LIPOSOME 1.3 % IJ SUSP: 20.0000 mL | Freq: Once | INTRAMUSCULAR | Status: DC

## 2024-05-08 MED ORDER — AMISULPRIDE (ANTIEMETIC) 5 MG/2ML IV SOLN
10.0000 mg | Freq: Once | INTRAVENOUS | Status: AC | PRN
Start: 2024-05-08 — End: 2024-05-08
  Administered 2024-05-08: 10 mg via INTRAVENOUS

## 2024-05-08 MED ORDER — LACTATED RINGERS IV SOLN: Freq: Three times a day (TID) | INTRAVENOUS | Status: AC | PRN

## 2024-05-08 MED ORDER — SODIUM CHLORIDE 0.9 % IV SOLN
2.0000 g | INTRAVENOUS | Status: AC
  Administered 2024-05-08: 2 g via INTRAVENOUS
  Filled 2024-05-08: qty 2

## 2024-05-08 MED ORDER — METHOCARBAMOL 500 MG PO TABS
1000.0000 mg | ORAL_TABLET | Freq: Four times a day (QID) | ORAL | Status: DC | PRN
  Filled 2024-05-08: qty 2

## 2024-05-08 MED ORDER — ENSURE SURGERY PO LIQD
237.0000 mL | Freq: Two times a day (BID) | ORAL | Status: DC
Start: 2024-05-09 — End: 2024-05-12
  Administered 2024-05-09 – 2024-05-11 (×4): 237 mL via ORAL
  Filled 2024-05-08 (×5): qty 237

## 2024-05-08 MED ORDER — AMLODIPINE BESYLATE 5 MG PO TABS
5.0000 mg | ORAL_TABLET | Freq: Every day | ORAL | Status: DC
  Administered 2024-05-09: 5 mg via ORAL
  Filled 2024-05-08: qty 1

## 2024-05-08 MED ORDER — ONDANSETRON HCL 4 MG/2ML IJ SOLN: 4.0000 mg | Freq: Once | INTRAMUSCULAR | Status: DC | PRN

## 2024-05-08 MED ORDER — DEXAMETHASONE SODIUM PHOSPHATE 4 MG/ML IJ SOLN: INTRAMUSCULAR | Status: DC | PRN

## 2024-05-08 MED ORDER — VITAMIN B-12 1000 MCG PO TABS
1000.0000 ug | ORAL_TABLET | Freq: Every day | ORAL | Status: DC
  Administered 2024-05-09 – 2024-05-11 (×3): 1000 ug via ORAL
  Filled 2024-05-08 (×3): qty 1

## 2024-05-08 MED ORDER — 0.9 % SODIUM CHLORIDE (POUR BTL) OPTIME
TOPICAL | Status: DC | PRN
  Administered 2024-05-08 (×2): 1000 mL

## 2024-05-08 MED ORDER — BUPIVACAINE-EPINEPHRINE (PF) 0.25% -1:200000 IJ SOLN
INTRAMUSCULAR | Status: AC
Start: 2024-05-08 — End: ?
  Filled 2024-05-08: qty 30

## 2024-05-08 MED ORDER — ACETAMINOPHEN 500 MG PO TABS
1000.0000 mg | ORAL_TABLET | ORAL | Status: AC
  Administered 2024-05-08: 1000 mg via ORAL
  Filled 2024-05-08: qty 2

## 2024-05-08 MED ORDER — SODIUM CHLORIDE 0.9% FLUSH
3.0000 mL | Freq: Two times a day (BID) | INTRAVENOUS | Status: DC
  Administered 2024-05-08 – 2024-05-11 (×6): 3 mL via INTRAVENOUS

## 2024-05-08 MED ORDER — ONDANSETRON HCL 4 MG/2ML IJ SOLN
INTRAMUSCULAR | Status: DC | PRN
Start: 2024-05-08 — End: 2024-05-08
  Administered 2024-05-08: 4 mg via INTRAVENOUS

## 2024-05-08 MED ORDER — SUGAMMADEX SODIUM 200 MG/2ML IV SOLN
INTRAVENOUS | Status: DC | PRN
  Administered 2024-05-08: 200 mg via INTRAVENOUS

## 2024-05-08 MED ORDER — STERILE WATER FOR IRRIGATION IR SOLN
Status: DC | PRN
  Administered 2024-05-08: 1000 mL

## 2024-05-08 MED ORDER — PROCHLORPERAZINE EDISYLATE 10 MG/2ML IJ SOLN
5.0000 mg | Freq: Four times a day (QID) | INTRAMUSCULAR | Status: DC | PRN
  Administered 2024-05-08 – 2024-05-09 (×3): 10 mg via INTRAVENOUS
  Administered 2024-05-10: 5 mg via INTRAVENOUS
  Administered 2024-05-12: 10 mg via INTRAVENOUS
  Filled 2024-05-08 (×5): qty 2

## 2024-05-08 MED ORDER — DEXMEDETOMIDINE HCL IN NACL 80 MCG/20ML IV SOLN
INTRAVENOUS | Status: DC | PRN
  Administered 2024-05-08 (×2): 4 ug via INTRAVENOUS
  Administered 2024-05-08: 8 ug via INTRAVENOUS

## 2024-05-08 MED ORDER — ADULT MULTIVITAMIN W/MINERALS CH
1.0000 | ORAL_TABLET | Freq: Every day | ORAL | Status: DC
  Administered 2024-05-09: 1 via ORAL
  Filled 2024-05-08 (×2): qty 1

## 2024-05-08 MED ORDER — BUPIVACAINE-EPINEPHRINE (PF) 0.25% -1:200000 IJ SOLN
INTRAMUSCULAR | Status: AC
  Filled 2024-05-08: qty 30

## 2024-05-08 SURGICAL SUPPLY — 88 items
BAG COUNTER SPONGE SURGICOUNT (BAG) ×1 IMPLANT
BLADE EXTENDED COATED 6.5IN (ELECTRODE) IMPLANT
CANNULA REDUCER 12-8 DVNC XI (CANNULA) IMPLANT
CELLS DAT CNTRL 66122 CELL SVR (MISCELLANEOUS) IMPLANT
CHLORAPREP W/TINT 26 (MISCELLANEOUS) IMPLANT
CLIP APPLIE 5 13 M/L LIGAMAX5 (MISCELLANEOUS) IMPLANT
CLIP APPLIE ROT 10 11.4 M/L (STAPLE) IMPLANT
COVER SURGICAL LIGHT HANDLE (MISCELLANEOUS) ×2 IMPLANT
COVER TIP SHEARS 8 DVNC (MISCELLANEOUS) ×1 IMPLANT
DEFOGGER SCOPE WARM SEASHARP (MISCELLANEOUS) ×1 IMPLANT
DEVICE TROCAR PUNCTURE CLOSURE (ENDOMECHANICALS) IMPLANT
DRAIN CHANNEL 19F RND (DRAIN) IMPLANT
DRAPE ARM DVNC X/XI (DISPOSABLE) ×4 IMPLANT
DRAPE COLUMN DVNC XI (DISPOSABLE) ×1 IMPLANT
DRAPE CV SPLIT W-CLR ANES SCRN (DRAPES) ×1 IMPLANT
DRAPE INCISE IOBAN 66X45 STRL (DRAPES) IMPLANT
DRAPE PERI GROIN 82X75IN TIB (DRAPES) ×1 IMPLANT
DRAPE SURG IRRIG POUCH 19X23 (DRAPES) ×1 IMPLANT
DRIVER NDL LRG 8 DVNC XI (INSTRUMENTS) ×1 IMPLANT
DRIVER NDLE LRG 8 DVNC XI (INSTRUMENTS) ×1 IMPLANT
DRSG OPSITE POSTOP 4X10 (GAUZE/BANDAGES/DRESSINGS) IMPLANT
DRSG OPSITE POSTOP 4X6 (GAUZE/BANDAGES/DRESSINGS) IMPLANT
DRSG OPSITE POSTOP 4X8 (GAUZE/BANDAGES/DRESSINGS) IMPLANT
DRSG TEGADERM 2-3/8X2-3/4 SM (GAUZE/BANDAGES/DRESSINGS) ×5 IMPLANT
DRSG TEGADERM 4X4.75 (GAUZE/BANDAGES/DRESSINGS) IMPLANT
ELECT PENCIL ROCKER SW 15FT (MISCELLANEOUS) ×1 IMPLANT
ELECT REM PT RETURN 15FT ADLT (MISCELLANEOUS) ×1 IMPLANT
ENDOLOOP SUT PDS II 0 18 (SUTURE) IMPLANT
EVACUATOR SILICONE 100CC (DRAIN) IMPLANT
GAUZE SPONGE 2X2 8PLY STRL LF (GAUZE/BANDAGES/DRESSINGS) ×1 IMPLANT
GLOVE ECLIPSE 8.0 STRL XLNG CF (GLOVE) ×3 IMPLANT
GLOVE INDICATOR 8.0 STRL GRN (GLOVE) ×3 IMPLANT
GOWN SRG XL LVL 4 BRTHBL STRL (GOWNS) ×1 IMPLANT
GOWN STRL REUS W/ TWL XL LVL3 (GOWN DISPOSABLE) ×4 IMPLANT
GRASPER SUT TROCAR 14GX15 (MISCELLANEOUS) IMPLANT
GRASPER TIP-UP FEN DVNC XI (INSTRUMENTS) ×1 IMPLANT
HOLDER FOLEY CATH W/STRAP (MISCELLANEOUS) ×1 IMPLANT
IRRIGATION SUCT STRKRFLW 2 WTP (MISCELLANEOUS) ×1 IMPLANT
KIT PROCEDURE DVNC SI (MISCELLANEOUS) IMPLANT
KIT SIGMOIDOSCOPE (SET/KITS/TRAYS/PACK) IMPLANT
KIT TURNOVER KIT A (KITS) IMPLANT
NDL INSUFFLATION 14GA 120MM (NEEDLE) ×1 IMPLANT
NEEDLE INSUFFLATION 14GA 120MM (NEEDLE) ×1 IMPLANT
PACK COLON (CUSTOM PROCEDURE TRAY) ×1 IMPLANT
PAD POSITIONING PINK XL (MISCELLANEOUS) ×1 IMPLANT
PROTECTOR NERVE ULNAR (MISCELLANEOUS) ×2 IMPLANT
RELOAD STAPLE 45 3.5 BLU DVNC (STAPLE) IMPLANT
RELOAD STAPLE 45 4.3 GRN DVNC (STAPLE) IMPLANT
RELOAD STAPLE 60 3.5 BLU DVNC (STAPLE) IMPLANT
RELOAD STAPLE 60 4.3 GRN DVNC (STAPLE) IMPLANT
RELOAD STAPLER 3.5X45 BLU DVNC (STAPLE) IMPLANT
RELOAD STAPLER 3.5X60 BLU DVNC (STAPLE) IMPLANT
RELOAD STAPLER 4.3X45 GRN DVNC (STAPLE) IMPLANT
RELOAD STAPLER 4.3X60 GRN DVNC (STAPLE) ×1 IMPLANT
RETRACTOR WND ALEXIS 18 MED (MISCELLANEOUS) IMPLANT
SCISSORS LAP 5X35 DISP (ENDOMECHANICALS) ×1 IMPLANT
SCISSORS MNPLR CVD DVNC XI (INSTRUMENTS) ×1 IMPLANT
SEAL UNIV 5-12 XI (MISCELLANEOUS) ×4 IMPLANT
SEALER VESSEL EXT DVNC XI (MISCELLANEOUS) ×1 IMPLANT
SOLUTION ELECTROSURG ANTI STCK (MISCELLANEOUS) ×1 IMPLANT
SPIKE FLUID TRANSFER (MISCELLANEOUS) ×1 IMPLANT
STAPLER 45 SUREFORM DVNC (STAPLE) IMPLANT
STAPLER 60 SUREFORM DVNC (STAPLE) IMPLANT
STAPLER 90 3.5 STD SLIM (STAPLE) IMPLANT
STAPLER ECHELON POWER CIR 29 (STAPLE) IMPLANT
STAPLER ECHELON POWER CIR 31 (STAPLE) IMPLANT
STAPLER PROXIMATE 75MM BLUE (STAPLE) IMPLANT
STOPCOCK 4 WAY LG BORE MALE ST (IV SETS) ×2 IMPLANT
SURGILUBE 2OZ TUBE FLIPTOP (MISCELLANEOUS) IMPLANT
SUT MNCRL AB 4-0 PS2 18 (SUTURE) ×1 IMPLANT
SUT PDS AB 1 CT1 27 (SUTURE) ×2 IMPLANT
SUT PROLENE 0 CT 2 (SUTURE) IMPLANT
SUT PROLENE 2 0 KS (SUTURE) IMPLANT
SUT PROLENE 2 0 SH DA (SUTURE) IMPLANT
SUT SILK 2 0 SH CR/8 (SUTURE) IMPLANT
SUT SILK 3 0 SH CR/8 (SUTURE) ×1 IMPLANT
SUT SILK 3-0 18XBRD TIE 12 (SUTURE) IMPLANT
SUT VIC AB 3-0 SH 18 (SUTURE) IMPLANT
SUT VIC AB 3-0 SH 27XBRD (SUTURE) IMPLANT
SUT VICRYL 0 UR6 27IN ABS (SUTURE) IMPLANT
SUTURE V-LC BRB 180 2/0GR6GS22 (SUTURE) IMPLANT
SYR 20ML ECCENTRIC (SYRINGE) ×1 IMPLANT
SYSTEM LAPSCP GELPORT 120MM (MISCELLANEOUS) IMPLANT
SYSTEM WOUND ALEXIS 18CM MED (MISCELLANEOUS) ×1 IMPLANT
TRAY FOLEY MTR SLVR 16FR STAT (SET/KITS/TRAYS/PACK) ×1 IMPLANT
TROCAR ADV FIXATION 5X100MM (TROCAR) ×1 IMPLANT
TUBING CONNECTING 10 (TUBING) ×2 IMPLANT
TUBING INSUFFLATION 10FT LAP (TUBING) ×1 IMPLANT

## 2024-05-08 NOTE — Anesthesia Postprocedure Evaluation (Signed)
 Anesthesia Post Note  Patient: Elizabeth Odom  Procedure(s) Performed: CLOSURE, COLOSTOMY, ROBOT-ASSISTED -ROBOTIC RECTOSIGMOID RESECTION (LAR) -TAKEDOWN OF END COLOSTOMY WITH ANASTOMOSIS -RESECTION OF SMALL INTESTINE WITH ANASTOMOSIS -SMALL BOWEL REPAIR -LYSIS OF ADHESIONS x 115 MINUTES (66% OF CASE),  -INTRAOPERATIVE ASSESSMENT OF TISSUE VASCULAR PERFUSION USING ICG (indocyanine green) IMMUNOFLUORESCENCE,  -TRANSVERSUS ABDOMINIS PLANE (TAP) BLOCK - BILATERAL LYSIS, ADHESIONS, LAPAROSCOPIC SIGMOIDOSCOPY, FLEXIBLE CYSTOSCOPY WITH INDOCYANINE GREEN IMAGING (ICG)     Patient location during evaluation: PACU Anesthesia Type: General Level of consciousness: awake Pain management: pain level controlled Vital Signs Assessment: post-procedure vital signs reviewed and stable Respiratory status: spontaneous breathing, nonlabored ventilation and respiratory function stable Cardiovascular status: blood pressure returned to baseline and stable Postop Assessment: no apparent nausea or vomiting Anesthetic complications: no   No notable events documented.  Last Vitals:  Vitals:   05/08/24 1730 05/08/24 1756  BP: 135/67 128/68  Pulse: 67 64  Resp: 17 16  Temp:    SpO2: 95% 98%    Last Pain:  Vitals:   05/08/24 1756  TempSrc:   PainSc: 0-No pain                 Jocie Meroney P Abbigaile Rockman

## 2024-05-08 NOTE — Transfer of Care (Signed)
 Immediate Anesthesia Transfer of Care Note  Patient: Elizabeth Odom  Procedure(s) Performed: Procedure(s) with comments: CLOSURE, COLOSTOMY, ROBOT-ASSISTED (N/A) - ROBOTIC OSTOMY TAKEDOWN LYSIS, ADHESIONS, LAPAROSCOPIC (N/A) - LYSIS OF ADHESIONS SIGMOIDOSCOPY, FLEXIBLE (N/A) CYSTOSCOPY WITH INDOCYANINE GREEN IMAGING (ICG) (N/A)  Patient Location: PACU  Anesthesia Type:General  Level of Consciousness:  sedated, patient cooperative and responds to stimulation  Airway & Oxygen Therapy:Patient Spontanous Breathing and Patient connected to face mask oxgen  Post-op Assessment:  Report given to PACU RN and Post -op Vital signs reviewed and stable  Post vital signs:  Reviewed and stable  Last Vitals:  Vitals:   05/08/24 1124  BP: (!) 155/88  Pulse: 82  Resp: 16  Temp: 36.6 C  SpO2: 98%    Complications: No apparent anesthesia complications

## 2024-05-08 NOTE — Op Note (Signed)
 Operative Note  Preoperative diagnosis:  1.  Ostomy reversal  Postoperative diagnosis: 1.  Ostomy reversal  Procedure(s): 1.  Cystoscopy 2. Firefly instillation in bilateral ureters 3.  Bilateral retrograde pyelogram   Surgeon: Aimee Alf MD  Assistants:  None  Anesthesia:  General  Complications:  None  EBL:  None for my portion of the case  Specimens: 1. None for my portion of the case  Drains/Catheters: 1.  16 fr catheter   Intraoperative findings:   Bladder without suspicious bladder lesions.  Retrograde pyelogram interpretation: Bilateral retrograde pyelograms demonstrating that firefly reach the renal collecting system and fill the entirety of the ureter.  No filling defects noted.   Indication: Status post large bowel obstruction resection and requires ostomy reversal General Surgery has requested firefly for identification of ureters  Description of procedure: The patient was identified and surgical site verification was performed prior to obtaining consent.  The patient was brought to the operating suite.  Under adequate general anesthesia the patient was positioned in dorsal lithotomy and prepped and draped.  A preoperative Time Out was performed addressing the anticipated surgical site, procedure, and safety precautions.  The 21Fr cystoscope was inserted into the patient's urethral meatus and advanced to the bladder.   The bladder was inspected with the 30 degree lens.  The ureteral orifices were in their normal anatomic positions.  The bladder showed no trabeculation.  There were no bladder tumors noted.  The right orifice was intubated with a sensor wire and a 5 [place] open ended catheter was placed over the wire into the right ureter and advanced to 25 cm.  The firefly was mixed with the contrast I then slowly pulled back and injected 7.5ml of the firefly contrast.  Retrograde pyelogram was taken demonstrating that the firefly had reached the renal  pelvis.  I subsequently turned my attention to the patient's left ureteral orifice and performed a similar task, injecting 7.53ml of the firefly solution. in a retrograde fashion in the left ureter.   I then  placed a 16 [place] Foley.  The case was then turned over to Dr. Hershell Lose team for the remainder of the procedure.  Aimee Alf, MD [facility]

## 2024-05-08 NOTE — Discharge Instructions (Signed)
 SURGERY: POST OP INSTRUCTIONS (Surgery for small bowel obstruction, colon resection, etc)   ######################################################################  EAT Gradually transition to a high fiber diet with a fiber supplement over the next few days after discharge  WALK Walk an hour a day.  Control your pain to do that.    CONTROL PAIN Control pain so that you can walk, sleep, tolerate sneezing/coughing, go up/down stairs.  HAVE A BOWEL MOVEMENT DAILY Keep your bowels regular to avoid problems.  OK to try a laxative to override constipation.  OK to use an antidairrheal to slow down diarrhea.  Call if not better after 2 tries  CALL IF YOU HAVE PROBLEMS/CONCERNS Call if you are still struggling despite following these instructions. Call if you have concerns not answered by these instructions  ######################################################################   DIET Follow a light diet the first few days at home.  Start with a bland diet such as soups, liquids, starchy foods, low fat foods, etc.  If you feel full, bloated, or constipated, stay on a ful liquid or pureed/blenderized diet for a few days until you feel better and no longer constipated. Be sure to drink plenty of fluids every day to avoid getting dehydrated (feeling dizzy, not urinating, etc.). Gradually add a fiber supplement to your diet over the next week.  Gradually get back to a regular solid diet.  Avoid fast food or heavy meals the first week as you are more likely to get nauseated. It is expected for your digestive tract to need a few months to get back to normal.  It is common for your bowel movements and stools to be irregular.  You will have occasional bloating and cramping that should eventually fade away.  Until you are eating solid food normally, off all pain medications, and back to regular activities; your bowels will not be normal. Focus on eating a low-fat, high fiber diet the rest of your life  (See Getting to Good Bowel Health, below).  CARE of your INCISION or WOUND  It is good for closed incisions and even open wounds to be washed every day.  Shower every day.  Short baths are fine.  Wash the incisions and wounds clean with soap & water .    You may leave closed incisions open to air if it is dry.   You may cover the incision with clean gauze & replace it after your daily shower for comfort.  TEGADERM:  You have clear gauze band-aid dressings over your closed incision(s).  Remove the dressings 2 days after surgery = 5/31.    If you have an open wound with a wound vac, see wound vac care instructions.    ACTIVITIES as tolerated Start light daily activities --- self-care, walking, climbing stairs-- beginning the day after surgery.  Gradually increase activities as tolerated.  Control your pain to be active.  Stop when you are tired.  Ideally, walk several times a day, eventually an hour a day.   Most people are back to most day-to-day activities in a few weeks.  It takes 4-8 weeks to get back to unrestricted, intense activity. If you can walk 30 minutes without difficulty, it is safe to try more intense activity such as jogging, treadmill, bicycling, low-impact aerobics, swimming, etc. Save the most intensive and strenuous activity for last (Usually 4-8 weeks after surgery) such as sit-ups, heavy lifting, contact sports, etc.  Refrain from any intense heavy lifting or straining until you are off narcotics for pain control.  You will have off  days, but things should improve week-by-week. DO NOT PUSH THROUGH PAIN.  Let pain be your guide: If it hurts to do something, don't do it.  Pain is your body warning you to avoid that activity for another week until the pain goes down. You may drive when you are no longer taking narcotic prescription pain medication, you can comfortably wear a seatbelt, and you can safely make sudden turns/stops to protect yourself without hesitating due to  pain. You may have sexual intercourse when it is comfortable. If it hurts to do something, stop.   MEDICATIONS Take your usually prescribed home medications unless otherwise directed.    Blood thinners:  You can restart any strong blood thinners after the second postoperative day  for example: COUMADIN (warfarin), XERELTO (rivaroxaban), ELIQUIS (apixaban), PLAVIX (clopidigrel), BRILINTA (ticagrelor), EFFIENT (prasugrel), PRADAXA (dabigatran), etc  Continue aspirin  before & after surgery..     Some oozing/bleeding the first 1-2 weeks is common but should taper down & be small volume.    If you are passing many large clots or having uncontrolling bleeding, call your surgeon    PAIN CONTROL Pain after surgery or related to activity is often due to strain/injury to muscle, tendon, nerves and/or incisions.  This pain is usually short-term and will improve in a few months.  To help speed the process of healing and to get back to regular activity more quickly, DO THE FOLLOWING THINGS TOGETHER: Increase activity gradually.  DO NOT PUSH THROUGH PAIN Use Ice and/or Heat Try Gentle Massage and/or Stretching Take over the counter pain medication Take Narcotic prescription pain medication for more severe pain  Good pain control = faster recovery.  It is better to take more medicine to be more active than to stay in bed all day to avoid medications.  Increase activity gradually Avoid heavy lifting at first, then increase to lifting as tolerated over the next 6 weeks. Do not "push through" the pain.  Listen to your body and avoid positions and maneuvers than reproduce the pain.  Wait a few days before trying something more intense Walking an hour a day is encouraged to help your body recover faster and more safely.  Start slowly and stop when getting sore.  If you can walk 30 minutes without stopping or pain, you can try more intense activity (running, jogging, aerobics, cycling, swimming,  treadmill, sex, sports, weightlifting, etc.) Remember: If it hurts to do it, then don't do it! Use Ice and/or Heat You will have swelling and bruising around the incisions.  This will take several weeks to resolve. Ice packs or heating pads (6-8 times a day, 30-60 minutes at a time) will help sooth soreness & bruising. Some people prefer to use ice alone, heat alone, or alternate between ice & heat.  Experiment and see what works best for you.  Consider trying ice for the first few days to help decrease swelling and bruising; then, switch to heat to help relax sore spots and speed recovery. Shower every day.  Short baths are fine.  It feels good!  Keep the incisions and wounds clean with soap & water .   Try Gentle Massage and/or Stretching Massage at the area of pain many times a day Stop if you feel pain - do not overdo it Take over the counter pain medication This helps the muscle and nerve tissues become less irritable and calm down faster Choose ONE of the following over-the-counter anti-inflammatory medications: Acetaminophen  500mg  tabs (Tylenol ) 1-2 pills with every meal  and just before bedtime (avoid if you have liver problems or if you have acetaminophen  in you narcotic prescription) Naproxen 220mg  tabs (ex. Aleve, Naprosyn) 1-2 pills twice a day (avoid if you have kidney, stomach, IBD, or bleeding problems) Ibuprofen 200mg  tabs (ex. Advil, Motrin) 3-4 pills with every meal and just before bedtime (avoid if you have kidney, stomach, IBD, or bleeding problems) Take with food/snack several times a day as directed for at least 2 weeks to help keep pain / soreness down & more manageable. Take Narcotic prescription pain medication for more severe pain A prescription for strong pain control is often given to you upon discharge (for example: oxycodone /Percocet, hydrocodone /Norco/Vicodin, or tramadol /Ultram ) Take your pain medication as prescribed. Be mindful that most narcotic prescriptions  contain Tylenol  (acetaminophen ) as well - avoid taking too much Tylenol . If you are having problems/concerns with the prescription medicine (does not control pain, nausea, vomiting, rash, itching, etc.), please call us  [phone] to see if we need to switch you to a different pain medicine that will work better for you and/or control your side effects better. If you need a refill on your pain medication, you must call the office before 4 pm and on weekdays only.  By federal law, prescriptions for narcotics cannot be called into a pharmacy.  They must be filled out on paper & picked up from our office by the patient or authorized caretaker.  Prescriptions cannot be filled after 4 pm nor on weekends.    WHEN TO CALL US  [phone] Severe uncontrolled or worsening pain  Fever over 101 F (38.5 C) Concerns with the incision: Worsening pain, redness, rash/hives, swelling, bleeding, or drainage Reactions / problems with new medications (itching, rash, hives, nausea, etc.) Nausea and/or vomiting Difficulty urinating Difficulty breathing Worsening fatigue, dizziness, lightheadedness, blurred vision Other concerns If you are not getting better after two weeks or are noticing you are getting worse, contact our office [phone] for further advice.  We may need to adjust your medications, re-evaluate you in the office, send you to the emergency room, or see what other things we can do to help. The clinic staff is available to answer your questions during regular business hours (8:30am-5pm).  Please don't hesitate to call and ask to speak to one of our nurses for clinical concerns.    A surgeon from [facility] is always on call at the hospitals 24 hours/day If you have a medical emergency, go to the nearest emergency room or call 911.  FOLLOW UP in our office One the day of your discharge from the hospital (or the next business weekday), please call [facility] to set up  or confirm an appointment to see your surgeon in the office for a follow-up appointment.  Usually it is 2-3 weeks after your surgery.   If you have skin staples at your incision(s), let the office know so we can set up a time in the office for the nurse to remove them (usually around 10 days after surgery). Make sure that you call for appointments the day of discharge (or the next business weekday) from the hospital to ensure a convenient appointment time. IF YOU HAVE DISABILITY OR FAMILY LEAVE FORMS, BRING THEM TO THE OFFICE FOR PROCESSING.  DO NOT GIVE THEM TO YOUR DOCTOR.  [facility] [address], [address], [place], [place]  [address] ? [phone] - Main [phone] - Toll Free,  [phone] - Fax [email or web address]  GETTING TO GOOD BOWEL HEALTH. It is expected for your digestive tract to need a few months to get back to normal.  It is common for your bowel movements and stools to be irregular.  You will have occasional bloating and cramping that should eventually fade away.  Until you are eating solid food normally, off all pain medications, and back to regular activities; your bowels will not be normal.   Avoiding constipation The goal: ONE SOFT BOWEL MOVEMENT A DAY!    Drink plenty of fluids.  Choose water  first. TAKE A FIBER SUPPLEMENT EVERY DAY THE REST OF YOUR LIFE During your first week back home, gradually add back a fiber supplement every day Experiment which form you can tolerate.   There are many forms such as powders, tablets, wafers, gummies, etc Psyllium bran (Metamucil), methylcellulose (Citrucel), Miralax  or Glycolax , Benefiber, Flax Seed.  Adjust the dose week-by-week (1/2 dose/day to 6 doses a day) until you are moving your bowels 1-2 times a day.  Cut back the dose or try a different fiber product if it is giving you problems such as diarrhea or bloating. Sometimes a laxative is needed to help jump-start bowels if  constipated until the fiber supplement can help regulate your bowels.  If you are tolerating eating & you are farting, it is okay to try a gentle laxative such as double dose MiraLax , prune juice, or Milk of Magnesia.  Avoid using laxatives too often. Stool softeners can sometimes help counteract the constipating effects of narcotic pain medicines.  It can also cause diarrhea, so avoid using for too long. If you are still constipated despite taking fiber daily, eating solids, and a few doses of laxatives, call our office. Controlling diarrhea Try drinking liquids and eating bland foods for a few days to avoid stressing your intestines further. Avoid dairy products (especially milk & ice cream) for a short time.  The intestines often can lose the ability to digest lactose when stressed. Avoid foods that cause gassiness or bloating.  Typical foods include beans and other legumes, cabbage, broccoli, and dairy foods.  Avoid greasy, spicy, fast foods.  Every person has some sensitivity to other foods, so listen to your body and avoid those foods that trigger problems for you. Probiotics (such as active yogurt, Align, etc) may help repopulate the intestines and colon with normal bacteria and calm down a sensitive digestive tract Adding a fiber supplement gradually can help thicken stools by absorbing excess fluid and retrain the intestines to act more normally.  Slowly increase the dose over a few weeks.  Too much fiber too soon can backfire and cause cramping & bloating. It is okay to try and slow down diarrhea with a few doses of antidiarrheal medicines.   Bismuth subsalicylate (ex. Kayopectate, Pepto Bismol) for a few doses can help control diarrhea.  Avoid if pregnant.   Loperamide (Imodium) can slow down diarrhea.  Start with one tablet (2mg ) first.  Avoid if you are having fevers or severe pain.  ILEOSTOMY PATIENTS WILL HAVE CHRONIC DIARRHEA since their colon is not in use.    Drink plenty of liquids.   You will need to drink even more glasses of water /liquid a day to avoid getting dehydrated. Record output from your ileostomy.  Expect to empty the bag every 3-4 hours at first.  Most people with a permanent ileostomy empty their bag 4-6 times at the least.   Use antidiarrheal medicine (especially Imodium) several times a day to avoid getting dehydrated.  Start with a dose at bedtime & breakfast.  Adjust up or down as needed.  Increase antidiarrheal medications as directed to avoid emptying the bag more than 8 times a day (every 3 hours). Work with your wound ostomy nurse to learn care for your ostomy.  See ostomy care instructions. TROUBLESHOOTING IRREGULAR BOWELS 1) Start with a soft & bland diet. No spicy, greasy, or fried foods.  2) Avoid gluten/wheat or dairy products from diet to see if symptoms improve. 3) Miralax  17gm or flax seed mixed in 8oz. water  or juice-daily. May use 2-4 times a day as needed. 4) Gas-X, Phazyme, etc. as needed for gas & bloating.  5) Prilosec (omeprazole) over-the-counter as needed 6)  Consider probiotics (Align, Activa, etc) to help calm the bowels down  Call your doctor if you are getting worse or not getting better.  Sometimes further testing (cultures, endoscopy, X-ray studies, CT scans, bloodwork, etc.) may be needed to help diagnose and treat the cause of the diarrhea. [facility] [address], [address], [place], [place]  [address] [phone] - Main.    [phone]  - Toll Free.   [phone] - Fax [email or web address]

## 2024-05-08 NOTE — H&P (Signed)
 H&P  Chief Complaint: Large bowel obstruction status post resection  History of Present Illness: 83 year old female who was previously admitted in late October for large bowel obstruction status post Hartman's resection and colostomy creation her hospital course was complicated by anastomotic leak.  She returns today for colostomy reversal.  General surgery has requested firefly instillation for identification of the ureters.  Past Medical History:  Diagnosis Date   A-fib (HCC) 04/09/2016   Acute head injury 04/09/2016   Anemia    Blood dyscrasia    Hx DVT / PE   Cervical cancer (HCC)    Cervical spine fracture, initial encounter 04/09/2016   Claustrophobia    extremely claustrophobic   Dupuytren disease of palm of both hands    Dysrhythmia    PACs,  A.fib   Essential hypertension    Fatty liver    History of anemia    History of kidney stones    MCI (mild cognitive impairment) with memory loss    Multiple fractures of cervical spine (HCC) 04/09/2016   PAC (premature atrial contraction)    Restless leg    Syncope and collapse 04/09/2016   Past Surgical History:  Procedure Laterality Date   APPENDECTOMY     BIOPSY  09/29/2023   Procedure: BIOPSY;  Surgeon: Genell Ken, MD;  Location: [facility];  Service: Gastroenterology;;   breast lump removal Left    CERVICAL LAMINECTOMY     COLECTOMY WITH COLOSTOMY CREATION/HARTMANN PROCEDURE N/A 10/01/2023   Procedure: COLECTOMY WITH COLOSTOMY CREATION/HARTMANN PROCEDURE;  Surgeon: Shela Derby, MD;  Location: [facility];  Service: General;  Laterality: N/A;   complete hysterectomy     EP IMPLANTABLE DEVICE N/A 04/10/2016   Procedure: Loop Recorder Insertion;  Surgeon: Tammie Fall, MD;  Location: [facility];  Service: Cardiovascular;  Laterality: N/A;   FLEXIBLE SIGMOIDOSCOPY N/A 09/29/2023   Procedure: FLEXIBLE SIGMOIDOSCOPY;  Surgeon: Genell Ken, MD;  Location: [facility];  Service: Gastroenterology;  Laterality: N/A;    HARVEST BONE GRAFT     IR IVC FILTER PLMT / S&I /IMG GUID/MOD SED  10/05/2023   IR SINUS/FIST TUBE CHK-NON GI  10/22/2023   IR SINUS/FIST TUBE CHK-NON GI  11/05/2023   LOOP RECORDER REMOVAL N/A 07/08/2018   Procedure: LOOP RECORDER REMOVAL;  Surgeon: Tammie Fall, MD;  Location: [facility];  Service: Cardiovascular;  Laterality: N/A;    Home Medications:  No medications prior to admission.   Allergies:  Allergies  Allergen Reactions   Elemental Sulfur Anaphylaxis    Family History  Problem Relation Age of Onset   Other Mother        thrombosis   Cancer Sister    Diabetes Sister    Stroke Daughter    Thyroid  disease Daughter    Social History:  reports that she has never smoked. She has never used smokeless tobacco. She reports that she does not drink alcohol  and does not use drugs.  ROS: A complete review of systems was performed.  All systems are negative except for pertinent findings as noted. ROS   Physical Exam:  Vital signs in last 24 hours:   General:  Alert and oriented, No acute distress Respiratory: Normal breathing room air Cardiovascular: Regular rate and rhythm per monitor  Laboratory Data:  No results found for this or any previous visit (from the past 24 hours). No results found for this or any previous visit (from the past 240 hours). Creatinine: Recent Labs    05/06/24  1145  CREATININE 0.86    Impression/Assessment:  83 year old female who was previously admitted in late October for large bowel obstruction status post Hartman's resection and colostomy creation her hospital course was complicated by anastomotic leak.  She returns today for colostomy reversal.  General surgery has requested firefly instillation for identification of the ureters.  .We discussed risk benefits alternatives of firefly instillation.  This included bleeding infection and damage to surrounding structures surrounding structures including ureter as well as urethra.   We discussed the need for stent postoperatively as well as the potential symptoms of stent placement.  We discussed possible inability to complete procedure due to caliber of your ureter or inability to pass stone possibly requiring long-term stent versus nephrostomy tube.  We discussed need for possible second surgery.  Patient voiced their understanding and consent was obtained.   Plan:  Plan for bilateral firefly instillation.  Thelbert Finner 05/08/2024, 7:03 AM

## 2024-05-08 NOTE — Progress Notes (Addendum)
 Patient was noticeably had moderate amount of blood and blood clots through her rectum. On call MD made aware.

## 2024-05-08 NOTE — Op Note (Signed)
 05/08/2024  4:17 PM  PATIENT:  Elizabeth Odom  83 y.o. female  Patient Care Team: Anthon Kins, MD as PCP - General (Internal Medicine) Cody Das, MD as PCP - Cardiology (Cardiology) Lajuan Pila, MD as Consulting Physician (Gastroenterology) Shela Derby, MD as Consulting Physician (General Surgery) Glory Larsen, MD (Neurology) Lahoma Pigg, MD as Consulting Physician (Urology) Hassan Links, MD as Consulting Physician (Cardiology) Candyce Champagne, MD as Consulting Physician (Colon and Rectal Surgery)  PRE-OPERATIVE DIAGNOSIS:  COLOSTOMY FOR RESECTION, DESIRE FOR OSTOMY TAKEDOWN  POST-OPERATIVE DIAGNOSIS:   COLOSTOMY FOR RESECTION, DESIRE FOR OSTOMY TAKEDOWN RECTAL STRICTURE HISTORY OF DIVERTICULITIS WITH PERFORATION & ABSCESS  PROCEDURE:   -ROBOTIC RECTOSIGMOID RESECTION (LAR) -TAKEDOWN OF END COLOSTOMY WITH ANASTOMOSIS -RESECTION OF SMALL INTESTINE WITH ANASTOMOSIS -SMALL BOWEL REPAIR -LYSIS OF ADHESIONS x 115 MINUTES (66% OF CASE),  -INTRAOPERATIVE ASSESSMENT OF TISSUE VASCULAR PERFUSION USING ICG (indocyanine green) IMMUNOFLUORESCENCE,  -TRANSVERSUS ABDOMINIS PLANE (TAP) BLOCK - BILATERAL -FLEXIBLE SIGMOIDOSCOPY  SURGEON:  Eddye Goodie, MD  ASSISTANT:  Joyce Nixon, MD  An experienced assistant was required given the standard of surgical care given the complexity of the case.  This assistant was needed for exposure, dissection, suction, tissue approximation, retraction, perception, etc  ANESTHESIA:  General endotracheal intubation anesthesia (GETA) and Regional TRANSVERSUS ABDOMINIS PLANE (TAP) nerve block -BILATERAL for perioperative & postoperative pain control at the level of the transverse abdominis & preperitoneal spaces along the flank at the anterior axillary line, from subcostal ridge to iliac crest under laparoscopic guidance provided with liposomal bupivacaine (Experel) 20mL mixed with 30 mL of bupivicaine 0.25% with  epinephrine   Estimated Blood Loss (EBL):   Total I/O In: 500 [I.V.:400; IV Piggyback:100] Out: 300 [Urine:200; Blood:100].   (See anesthesia record)  Delay start of Pharmacological VTE agent (>24hrs) due to concerns of significant anemia, surgical blood loss, or risk of bleeding?:  no  DRAINS: (None) and 19 Fr Blake drain the tip resting in the pelvis  SPECIMEN:   Small intestine (distal jejunum with old small bowel anastomosis) End Colostomy Proximal to mid rectum Distal anastomotic ring (FINAL DISTAL MARGIN)  DISPOSITION OF SPECIMEN:  Pathology  COUNTS:  Sponge, needle, & instrument counts CORRECT at the conclusion of the case.      PLAN OF CARE: Admit to inpatient   PATIENT DISPOSITION:  PACU - hemodynamically stable.  INDICATION: Pleasant patient with stricture and abscess causing obstruction requiring Hartmann resection colectomy with end ostomy.  No bowel resection.  The patient has recovered from that surgery and has understandably requested ostomy takedown.  Medically stabilized and felt reasonable to proceed.   I discussed the procedure with the patient:  The anatomy & physiology of the digestive tract was discussed.  The pathophysiology was discussed.  Possibility of remaining with an ostomy permanently was discussed.  I offered ostomy takedown.  Minimally invasive & open techniques were discussed.   Risks such as bleeding, infection, abscess, leak, reoperation, possible re-ostomy, injury to other organs, hernia, heart attack, death, and other risks were discussed.   I noted a good likelihood this will help address the problem.  Goals of post-operative recovery were discussed as well.  We will work to minimize complications.  Questions were answered.  The patient expresses understanding & wishes to proceed with surgery.  OR FINDINGS:   Very dense adhesions of small bowel especially to pelvis and retroperitoneum.  Extremely concrete dense adhesions with friable tissue near  her old prior small bowel anastomosis and distal  jejunum.  Required small bowel repair and required small bowel resection including old anastomosis given severe tissues.  Patient had significant fibrotic stricturing of rectal stump at the mid/distal rectal junction.  Could not be released up.  Ended up doing resection of rectal stump to mid/distal rectal junction.  No obvious metastatic disease on visceral parietal peritoneum or liver.  It is a 29mm EEA anastomosis ( distal descending colon  connected to mid/distal rectal junction.)  It rests 7 cm from the anal verge by rigid proctoscopy.  CASE DATA: Type of patient?: Elective [facility] Private Case Status of Case? Elective Scheduled Infection Present At Time Of Surgery (PATOS)?  NO   DESCRIPTION:   Informed consent was confirmed.  The patient underwent general anaesthesia without difficulty.  The patient was positioned appropriately.  VTE prevention in place.  The patient's abdomen was clipped, prepped, & draped in a sterile fashion.  Surgical timeout confirmed our plan.  Peritoneal entry with a laparoscopic port was obtained using Varess spring needle entry technique in the left upper abdomen as the patient was positioned in reverse Trendelenburg.  I induced carbon dioxide insufflation.  No change in end tidal CO2 measurements.  Full symmetrical abdominal distention.  Initial port was carefully placed.  Camera inspection revealed no injury.  Extra ports were carefully placed under direct laparoscopic visualization.  Xi robot carefully docked & instruments placed.  We then continued with minimally invasive exploration.  I worked to freed adhesions to the anterior abdominal wall parietal peritoneum.  Freed omental adhesions off the colostomy and visceral peritoneum.  Moderately dense small bowel adhesions and especially omentum.  Freed omentum off the small bowel.  Did off the pelvis.  Freed attachments to the colon mesentery and got greater omentum and  loops of small bowel off that as well.  Moderately dense retroperitoneal adhesions along the left flank.  Carefully freed off.  Make sure to free all small bowel interloop adhesions and adhesions to the left retroperitoneum and colostomy mesentery and pelvis.  We inspected the small intestine to ensure there is no injury or other surprises.  Hemostasis was good.    We focused on dissection down in the pelvis.  Work to free adhesions and identify the rectal stump.  Took quite some time and was extremely conchae and inherent with very poor planes.  Ended up having to err on the side of going into very beat up worse bowel and got to enterotomies.  Controlled spillage.  Found the blue Prolene tails consistent with marking of the rectal stump.  It was rather contracted and frozen down in the pelvis.  Freed off visceral peritoneum along the right and left anterior rectal pelvic reflection.  Work to come behind the rectal stump and the presacral plane to help elevate the mesorectum off the sacral promontory to be sure we adequately identified the rectosigmoid junction.  I did do rectal examination anesthesia and brought up dilators and did finger dissection.  At the tip my finger around 8 cm from the anal verge was a tight kinks stricturing.  Concerning for persistent stricture.  It was at the low peritoneal reflection.  Went back and carefully freed off thick peritoneal coverings of the mid rectum at the peritoneal reflection, taking care to avoid injury to the vaginal cuff.  That helped straighten things out.  I was able to get to an anterior rectal wall that was soft in the mid and distal rectum.  It came around to the lateral areas.  Left  lateral and posterolateral mesentery with some what [facility] and fibrotic in the proximal mesorectum.  We meticulously freed off the adhesions to try and unkinked the stump and released the stricturing but it was not salvageable.  Concerning for persistent stricture.  Decided the  best thing to do would be to resect the mid sigmoid stricture.  Came through the mesorectum with the vessel sealer and peeled down until they had soft mesorectum and soft mid rectum at the mid/distal junction.    To assess vascular perfusion of tissues, we asked anesthesia use intravenous  indocyanine green (ICG) with IV flush.  I switched to the NIR fluorescence (Firefly mode) imaging window on the daVinci robot platform.  We were able to see good light green visualization of blood vessels with good vascular perfusion of tissues, confirming good tissue perfusion of tissues (distal colostomy of descending colon and mid/distal rectal junction) planned for anastomosis. Stapled out off with a 60 mm green load stapler x 1 firing to good result.    We made an incision around the ostomy.  I got into the subcutaneous tissues.  I used careful focused right angle dissection and sharp dissection.  Some focused cautery dissection as well.  That helped to free adhesions to the subcutaneous tisses & fascia.  I was able to enter into the peritoneum focally.  I did a gentle finger sweep.  Gradually came around circumferentially and freed the bowel from remaining adhesions to the abdominal wall.  We were able eviscerate the ostomy to do inspection and assured viability and hemostasis.  I chose as distal the location that was viable for the proximal end of the anastomosis.  I clamped the colon at the point of resection using a reusable pursestringer device.  Passed a 2-0 Keith needle. I transected at the descending/sigmoid junction with a scalpel. I got healthy bleeding mucosa.  We sent the rectosigmoid colon specimen off to go to pathology.  We sized the colon orifice.   I chose a 29mm EEA anvil stapler system.  I reinforced the prolene pursestring with interrupted silk "belt loop" sutures.  I placed the anvil to the open end of the proximal remaining colon and closed around it using the pursestring.   Returned the viscera  back into the abdomen and did inspection of the abdomen.    I removed the transected proximal/mid rectal stump and sent that off.  We eviscerated the small bowel that was suspicious for enterotomies.  Freed off some interloop adhesions.  It was very close to the prior side-to-side anastomosis where there was still some inflammation.  One of the enterotomies was rather small, 7 mm.  We primarily repaired that transversely using interrupted silk sutures.  That was in the mid jejunum.  However the larger enterotomy ran at the small bowel anastomosis and not salvageable.  I therefore decided to do a resection.  Resected the old small bowel anastomosis as well.  Did a Barcelona type side-to-side stapled anastomosis with a 75 GIA stapler.  Placed silk at the proximal crotch of the side-to-side anastomosis for an antitension stitch.  I then used a TX 90 stapler to transect and closed the common staple defect to good result.  We took the intervening mesentery with clamps and silk ties.  I then closed the jejunal mesentery defect transversely, covering the TX 90 staple line with interrupted silk suture to protect it for a mesenteropexy.    We did copious irrigation with crystalloid solution.  Hemostasis was good.  The distal end of the colon at the handle easily reached down to the rectal stump, therefore, splenic flexure mobilization was not needed.  Inspected the bowel and did irrigation of extra liter with good return with good hemostasis.  I scrubbed down and did gentle anal dilation and advanced the EEA stapler up the rectal stump. The spike was brought out at the provimal end of the rectal stump under direct visualization.  Dr Andy Bannister attached the anvil of the proximal colon the spike of the stapler. Anvil was tightened down and held clamped for 60 seconds.  Orientation was confirmed such that there is no twisting of the colon nor small bowel underneath the mesenteric defect. No concerning tension.  The EEA  stapler was fired and held clamped for 30 seconds. The stapler was released & removed. Blue stitch is in the proximal ring.  Care was taken to ensure no other structures were incorporated within this either.  We noted 2 excellent anastomotic rings.   The colon proximal to the anastomosis was then gently occluded. The pelvis was filled with sterile irrigation.  I did flexible sigmoidoscopy.  Noted the anastomosis was at 7 cm from the anal verge consistent with the proximal rectum.  Intact anastomosis without active bleeding.  No mucosal ischemia.  There was a negative air leak test. There was no tension of mesentery or bowel at the anastomosis.   Tissues looked viable.  Ureters & bowel uninjured.  The anastomosis looked healthy.  I could palpate the EEA anastomosis at the tip of my finger greater omentum positioned down into the pelvis to help protect the anastomosis.  Dr. Andy Bannister ran the small bowel from the ileocecal valve to ligament of Treitz and confirmed intact anastomosis and enterotomy repair.  Mucosa viable with no other serosal injury or enterotomy injury.  No evidence of any injury or concerns or bleeding elsewhere.  Reassuring.  We removed CO2 gas out through the ports.  Ports and will protector and instruments removed.  We changed gown and gloves.  The patient was re-draped.  Sterile unused instruments were used from this point out per colon SSI prevention protocol.  I closed the 5mm port sites using Monocryl stitch and sterile dressing.  I closed the fascia of the abdominal wall ostomy wound using #1 PDS. I closed the skin with some interrupted Monocryl stitches. I placed antibiotic-soaked wicks into the closure at the corners x2. I placed a sterile dressing.    Patient is being extubated go to recovery room. I discussed postop care with the patient in detail the office & in the holding area with her daughter as well.. Instructions are written.  I discussed operative findings, updated the patient's  status, discussed probable steps to recovery, and gave postoperative recommendations to the patient's granddaugher, Genevive Ket.  Recommendations were made.  Questions were answered.  She expressed understanding & appreciation.    Eddye Goodie, M.D., F.A.C.S. Gastrointestinal and Minimally Invasive Surgery [facility] [address] [place], [place] [address] [phone] Main / Paging

## 2024-05-08 NOTE — H&P (Addendum)
 05/08/2024   PROVIDER: Girtha Lama, MD  Patient Care Team: Arnie Lao, NP as PCP - General (Family Medicine)  [facility] MRN: [id number] DOB: 1941-01-21 DATE OF ENCOUNTER: 04/07/2024  Interval History:   The patient returns to the office after undergoing urgent exploratory laparotomy, small bowel resection, sigmoid colectomy/colostomy = Hartmann for sigmoid stricture causing colon obstruction. On 10/01/2023  Pathology: Benign stricture with diverticulosis and rupture and abscess. No malignancy.  Has history of chronic atrial fibrillation usually not on anticoagulation, hypertension, cervical cancer. Sounds like she had some syncopal events and had a loop recorder for a time. Did not like it and no major issues so was removed after being in 2 years 2019 had developed some crampy abdominal pain with colon inflammation. Initially felt to be colitis on antibiotics. Started having worsening diarrhea then worsening cramping pain. Rectosigmoid rather thickened and completely obstructed. Obstipated for many days. Was admitted 09/27/2023. Flexible sigmoidoscopy noted stricture that could not be passed. Urgent consultation. Required urgent Hartmann resection for the stricture. Placed on TPN with prolonged ileus. Drainage of pelvic abscess. Had a PE that required IVC filter placement. Required transfusion given the anticoagulation. That was held. Switch to an IVC filter. He had a prolonged hospital stay but eventually was discharged after 5 weeks.   I think she had difficulty doing therapy and there is discussion of palliative follow-up. However she seemed to improve and was followed by Dr. Melton Squires. Had healed up and was feeling better by January. He recommended colonoscopy before considering colorectal surgery follow-up consideration of minimally invasive colostomy takedown. Looks like she was scheduled to get a colonoscopy for 12/2023 with Dr. Venice Gillis but that did not happen.  Patient walks in  today with her grand-daughter. No cane or wheelchair or walker. She wants her colostomy takedown as soon as possible. She changes the bag about every 4 days. She notes it is hot and blistery when she is outside around the colostomy bag. No major leaking though. She can walk at least 1/2-hour without difficulty. No problems with urinary incontinence or fecal incontinence. Appetite good. Energy level good.    Labs, Imaging and Diagnostic Testing:  Located in 'Care Everywhere' section of Epic EMR chart  PRIOR [facility] CLINIC NOTES:  Located in 'Care Everywhere' section of Epic EMR chart  SURGERY NOTES:  Located in 'Care Everywhere' section of Epic EMR chart  PATHOLOGY:  Located in 'Care Everywhere' section of Epic EMR chart  SURGICAL PATHOLOGY CASE: [id number] PATIENT: Elizabeth Odom Surgical Pathology Report  Clinical History: Acute colitis (crm)  FINAL MICROSCOPIC DIAGNOSIS:  A. COLON, SIGMOID WITH MASS, RESECTION: - Colon with diverticulosis with evidence of previous rupture with adhesions/stricturing and abundant lymphohistiocytic response and focal acute inflammation most consistent with a resolving abscess. - Viable margins with adhesions and chronic lymphohistiocytic inflammation. - 1 lymph node, negative for malignancy.  B. SMALL BOWEL, RESECTION: - Overall viable small intestine with focal evidence of adhesions. - 2 lymph nodes, negative for malignancy.   Physical Examination:   There is no height or weight on file to calculate BMI.  Constitutional: Not cachectic. Hygeine adequate.  Eyes: Normal extraocular movements. Sclera nonicteric Neuro: No major focal sensory defects. No major motor deficits. Psych: No severe agitation. No severe anxiety. Judgment & insight Adequate, Oriented x4, HENT: Normocephalic, Mucus membranes moist. No thrush.  Neck: Supple, No tracheal deviation.  Chest: Good respiratory excursion. No audible wheezing CV: No major extremity  edema Ext: No obvious deformity or contracture. Edema: not present. No  cyanosis Skin: Warm and dry Musculoskeletal: Mobility: no assist device moving easily without restrictions  Abdomen: Incisions Clean & dry with normal healing ridge Nontender. Soft. Nondistended. Ostomy appliance with good hearing with normal pink rosebud and stool in bag  Gen: Inguinal hernia: Not present. Inguinal lymph nodes: without lymphadenopathy.   Rectal: (Deferred)    Assessment and Plan:   Elizabeth Odom is a 83 y.o. female recovering s/p exploratory laparotomy and Hartman resection of sigmoid colon for sigmoid stricture.  Pathology consistent with diverticular disease.  There are no diagnoses linked to this encounter.   Despite her 5-week hospital stay and being very deconditioned she has made remarkable recovery.  I do not think her labs have been rechecked in 6 months. Would be good to get CBC and c-Met just to get a baseline to make sure her nutrition has resolved and her anemia has resolved.  Reasonable at some point to consider colostomy takedown with a minimally invasive approach. Robotic. Ask urology to do cystoscopy and firefly to make sure the ureters are not injured or uninvolved given the stricturing and pelvic abscesses.  The anatomy & physiology of the digestive tract was discussed. The pathophysiology was discussed. Possibility of remaining with an ostomy permanently was discussed. I offered ostomy takedown. Laparoscopic & open techniques were discussed.   Risks such as bleeding, infection, abscess, leak, reoperation, possible re-ostomy, injury to other organs, need for repair of tissues / organs, need for further treatment, hernia, heart attack, death, and other risks were discussed. I noted a good likelihood this will help address the problem. Goals of post-operative recovery were discussed as well. We will work to minimize complications. Questions were answered. The patient expresses  understanding & wishes to proceed with surgery.  Seen by cardiology.  Dr. Filiberto Hug started pt on amlodipine , and he felt like she had good performance status and was okay for colostomy takedown.   Eddye Goodie, MD, FACS, MASCRS Esophageal, Gastrointestinal & Colorectal Surgery Robotic and Minimally Invasive Surgery  [facility] [address], [address] [place], [place] [address] [phone] Fax [phone] Main  CONTACT INFORMATION: Weekday (9AM-5PM): Call [facility] main office at [phone] Weeknight (5PM-9AM) or Weekend/Holiday: Check EPIC "Web Links" tab & use "AMION" (password " [email or web address]") for General Surgery [facility] coverage  Please, DO NOT use SecureChat  (it is not reliable communication to reach operating surgeons & will lead to a delay in care).   Epic staff messaging available for outptient concerns needing 1-2 business day response.

## 2024-05-08 NOTE — Anesthesia Procedure Notes (Signed)
 Procedure Name: Intubation Date/Time: 05/08/2024 12:11 PM  Performed by: Judd Northern, RNPre-anesthesia Checklist: Patient identified, Emergency Drugs available, Suction available, Patient being monitored and Timeout performed Patient Re-evaluated:Patient Re-evaluated prior to induction Oxygen Delivery Method: Circle system utilized Preoxygenation: Pre-oxygenation with 100% oxygen Induction Type: IV induction Ventilation: Mask ventilation without difficulty Laryngoscope Size: Mac and 3 Grade View: Grade I Tube type: Oral Tube size: 7.0 mm Number of attempts: 1 Airway Equipment and Method: Stylet Placement Confirmation: ETT inserted through vocal cords under direct vision, positive ETCO2, CO2 detector and breath sounds checked- equal and bilateral Secured at: 22 cm Tube secured with: Tape Dental Injury: Teeth and Oropharynx as per pre-operative assessment

## 2024-05-09 ENCOUNTER — Encounter (HOSPITAL_COMMUNITY): Payer: Self-pay | Admitting: Surgery

## 2024-05-09 ENCOUNTER — Inpatient Hospital Stay (HOSPITAL_COMMUNITY)

## 2024-05-09 ENCOUNTER — Other Ambulatory Visit: Payer: Self-pay

## 2024-05-09 DIAGNOSIS — K5790 Diverticulosis of intestine, part unspecified, without perforation or abscess without bleeding: Secondary | ICD-10-CM

## 2024-05-09 LAB — CBC
HCT: 34.5 % — ABNORMAL LOW (ref 36.0–46.0)
Hemoglobin: 11.3 g/dL — ABNORMAL LOW (ref 12.0–15.0)
MCH: 31.6 pg (ref 26.0–34.0)
MCHC: 32.8 g/dL (ref 30.0–36.0)
MCV: 96.4 fL (ref 80.0–100.0)
Platelets: 280 10*3/uL (ref 150–400)
RBC: 3.58 MIL/uL — ABNORMAL LOW (ref 3.87–5.11)
RDW: 13 % (ref 11.5–15.5)
WBC: 14 10*3/uL — ABNORMAL HIGH (ref 4.0–10.5)
nRBC: 0 % (ref 0.0–0.2)

## 2024-05-09 LAB — CBC WITH DIFFERENTIAL/PLATELET
Abs Immature Granulocytes: 0.14 10*3/uL — ABNORMAL HIGH (ref 0.00–0.07)
Basophils Absolute: 0 10*3/uL (ref 0.0–0.1)
Basophils Relative: 0 %
Eosinophils Absolute: 0 10*3/uL (ref 0.0–0.5)
Eosinophils Relative: 0 %
HCT: 35.5 % — ABNORMAL LOW (ref 36.0–46.0)
Hemoglobin: 11.4 g/dL — ABNORMAL LOW (ref 12.0–15.0)
Immature Granulocytes: 1 %
Lymphocytes Relative: 4 %
Lymphs Abs: 0.7 10*3/uL (ref 0.7–4.0)
MCH: 31.5 pg (ref 26.0–34.0)
MCHC: 32.1 g/dL (ref 30.0–36.0)
MCV: 98.1 fL (ref 80.0–100.0)
Monocytes Absolute: 1.8 10*3/uL — ABNORMAL HIGH (ref 0.1–1.0)
Monocytes Relative: 9 %
Neutro Abs: 17 10*3/uL — ABNORMAL HIGH (ref 1.7–7.7)
Neutrophils Relative %: 86 %
Platelets: 315 10*3/uL (ref 150–400)
RBC: 3.62 MIL/uL — ABNORMAL LOW (ref 3.87–5.11)
RDW: 13 % (ref 11.5–15.5)
WBC: 19.7 10*3/uL — ABNORMAL HIGH (ref 4.0–10.5)
nRBC: 0 % (ref 0.0–0.2)

## 2024-05-09 LAB — URINALYSIS, ROUTINE W REFLEX MICROSCOPIC
Bacteria, UA: NONE SEEN
Bilirubin Urine: NEGATIVE
Glucose, UA: >=500 mg/dL — AB
Ketones, ur: NEGATIVE mg/dL
Nitrite: NEGATIVE
Protein, ur: 30 mg/dL — AB
RBC / HPF: >50 RBC/hpf (ref 0–5)
Specific Gravity, Urine: 1.023 (ref 1.005–1.030)
pH: 5 (ref 5.0–8.0)

## 2024-05-09 LAB — BASIC METABOLIC PANEL WITH GFR
Anion gap: 11 (ref 5–15)
BUN: 16 mg/dL (ref 8–23)
CO2: 21 mmol/L — ABNORMAL LOW (ref 22–32)
Calcium: 8.4 mg/dL — ABNORMAL LOW (ref 8.9–10.3)
Chloride: 102 mmol/L (ref 98–111)
Creatinine, Ser: 1.06 mg/dL — ABNORMAL HIGH (ref 0.44–1.00)
GFR, Estimated: 52 mL/min — ABNORMAL LOW (ref >60)
Glucose, Bld: 207 mg/dL — ABNORMAL HIGH (ref 70–99)
Potassium: 3.8 mmol/L (ref 3.5–5.1)
Sodium: 134 mmol/L — ABNORMAL LOW (ref 135–145)

## 2024-05-09 LAB — MAGNESIUM: Magnesium: 1.6 mg/dL — ABNORMAL LOW (ref 1.7–2.4)

## 2024-05-09 MED ORDER — METOPROLOL TARTRATE 5 MG/5ML IV SOLN: 5.0000 mg | INTRAVENOUS | Status: DC | PRN

## 2024-05-09 MED ORDER — SODIUM CHLORIDE 0.9% FLUSH
3.0000 mL | Freq: Two times a day (BID) | INTRAVENOUS | Status: DC
  Administered 2024-05-09 – 2024-06-25 (×63): 3 mL via INTRAVENOUS

## 2024-05-09 MED ORDER — DILTIAZEM LOAD VIA INFUSION: 15.0000 mg | Freq: Once | INTRAVENOUS | Status: DC

## 2024-05-09 MED ORDER — SIMETHICONE 80 MG PO CHEW
80.0000 mg | CHEWABLE_TABLET | Freq: Four times a day (QID) | ORAL | Status: DC
  Administered 2024-05-09 – 2024-05-11 (×8): 80 mg via ORAL
  Filled 2024-05-09 (×9): qty 1

## 2024-05-09 MED ORDER — DILTIAZEM HCL 25 MG/5ML IV SOLN
15.0000 mg | Freq: Once | INTRAVENOUS | Status: AC
  Administered 2024-05-09: 15 mg via INTRAVENOUS
  Filled 2024-05-09: qty 5

## 2024-05-09 MED ORDER — LACTATED RINGERS IV BOLUS: 1000.0000 mL | Freq: Three times a day (TID) | INTRAVENOUS | Status: AC | PRN

## 2024-05-09 MED ORDER — SODIUM CHLORIDE 0.9 % IV SOLN
250.0000 mg | Freq: Three times a day (TID) | INTRAVENOUS | Status: AC
  Administered 2024-05-09 – 2024-05-10 (×6): 250 mg via INTRAVENOUS
  Filled 2024-05-09 (×6): qty 5

## 2024-05-09 MED ORDER — METOCLOPRAMIDE HCL 5 MG/ML IJ SOLN
5.0000 mg | Freq: Three times a day (TID) | INTRAMUSCULAR | Status: AC
  Administered 2024-05-09 – 2024-05-10 (×6): 5 mg via INTRAVENOUS
  Filled 2024-05-09 (×6): qty 2

## 2024-05-09 MED ORDER — ORAL CARE MOUTH RINSE: 15.0000 mL | OROMUCOSAL | Status: DC | PRN

## 2024-05-09 MED ORDER — FAMOTIDINE 20 MG PO TABS
20.0000 mg | ORAL_TABLET | Freq: Every day | ORAL | Status: DC
  Administered 2024-05-09 – 2024-05-11 (×2): 20 mg via ORAL
  Filled 2024-05-09 (×2): qty 1

## 2024-05-09 MED ORDER — CHLORHEXIDINE GLUCONATE CLOTH 2 % EX PADS
6.0000 | MEDICATED_PAD | Freq: Every day | CUTANEOUS | Status: DC
  Administered 2024-05-09 – 2024-05-26 (×17): 6 via TOPICAL

## 2024-05-09 MED ORDER — SODIUM CHLORIDE 0.9% FLUSH: 3.0000 mL | INTRAVENOUS | Status: DC | PRN

## 2024-05-09 MED ORDER — SODIUM CHLORIDE 0.9 % IV SOLN
250.0000 mL | INTRAVENOUS | Status: DC | PRN
  Administered 2024-06-19: 250 mL via INTRAVENOUS

## 2024-05-09 MED ORDER — FAMOTIDINE 20 MG PO TABS: 20.0000 mg | ORAL_TABLET | Freq: Two times a day (BID) | ORAL | Status: DC

## 2024-05-09 NOTE — Progress Notes (Signed)
 Called by rapid response team.  Patient in atrial fibrillation.  Somewhat rapid rate.  Alert.  Not hypotensive.  Giving a trial of metoprolol.  I am in the OR.  I recommended transfer to stepdown unit.  Requested [facility] internal medicine hospitalist team to help follow and manage the patient with us .

## 2024-05-09 NOTE — Plan of Care (Signed)
  Problem: Education: Goal: Understanding of discharge needs will improve Outcome: Progressing Goal: Verbalization of understanding of the causes of altered bowel function will improve Outcome: Progressing   Problem: Activity: Goal: Ability to tolerate increased activity will improve Outcome: Progressing   Problem: Bowel/Gastric: Goal: Gastrointestinal status for postoperative course will improve Outcome: Progressing   Problem: Health Behavior/Discharge Planning: Goal: Identification of community resources to assist with postoperative recovery needs will improve Outcome: Progressing

## 2024-05-09 NOTE — Progress Notes (Signed)
 Called and updated granddaughter Mrs. Gabriel John.  Patient likes the mango icy.  Will try to give her basic medications and Ultram  in replace of Gabapentin.

## 2024-05-09 NOTE — Consult Note (Signed)
 Initial Consultation Note   Patient: Elizabeth Odom ZDG:[id number] DOB: 1941/04/24 PCP: Anthon Kins, MD DOA: 05/08/2024 DOS: the patient was seen and examined on 05/09/2024 Primary service: Candyce Champagne, MD  Referring physician: Candyce Champagne, MD Reason for consult: Afib   Assessment and Plan: Service: Hospitalist  #Paroxysmal atrial fibrillation She had a remote history of A-fib, in the past she had a loop recorder which ended up being negative, at some point was on anticoagulation however she had some bleeding and was d/ced.  Most recent EKG on 04/08/2024 by her cardiologist, she was on  sinus rhythm.  I suspect that this is due to dehydration and medication induced, post op ? Bleeding.  Increase IV fluids, GFR slight decrease today. 1 dose of IV Cardizem given, with good response to heart rate, will give a second bolus to see if we can achieve sinus rhythm.  Avoid multiple medications at the same time, especially w Dilaudid .  No indication for anticoagulation at this time.  If no spontaneous conversion consider consulting cardiology in the morning.   #Hypertension Blood pressure was stable.  Holding amlodipine  for now.  Metoprolol IV as needed.  #History of diverticulitis with perforation and abscess, with colostomy now status post ostomy takedown. Surgical sites no seems distended, no signs of acute bleeding.  Stat CBC.  Followed by general surgery team.  Addendum: H/H stable, WBC trending up could be post op vs infectious process, I have ordered UA and urine culture.   TR[facility] will continue to follow the patient.   HPI: Elizabeth Odom is a 83 y.o. female with past medical history of hypertension, history of partial resection of the colon with colostomy, remote history of atrial fibrillation, PACs, prior DVT and small PE, previously on anticoagulation then was placed on IVC filter which was subsequently removed and hyperlipidemia who was admitted for colostomy takedown by general  surgery team.  Patient underwent robotic procedure on 5/29 which tolerated well.  Today patient received her nausea medication with addition of 0.5 mg of Dilaudid  and developed lethargy around 5:00 PM.  Subsequently she was found to be on A-fib with mild RVR.  Patient was transferred to stepdown unit, metoprolol was recommended however never administered.  Per nursing reports slowly bleeding but nothing profusely or rapidly.  Upon my evaluation, patient is mildly lethargic, however responding to questions appropriately.  She denies any chest pain, shortness of breath, palpitations, dizziness and cough.  She does endorse mild abdominal pain.  She denies any pain with urination.  Nursing reports that she has been having bowel movement with blood, also when she urinates this seems to be mixed with stool and blood.  Review of Systems: As mentioned in the history of present illness. All other systems reviewed and are negative.   Past Medical History:  Diagnosis Date   A-fib (HCC) 04/09/2016   Acute head injury 04/09/2016   Anemia    Blood dyscrasia    Hx DVT / PE   Cervical cancer (HCC)    Cervical spine fracture, initial encounter 04/09/2016   Claustrophobia    extremely claustrophobic   Dupuytren disease of palm of both hands    Dysrhythmia    PACs,  A.fib   Essential hypertension    Fatty liver    History of anemia    History of kidney stones    MCI (mild cognitive impairment) with memory loss    Multiple fractures of cervical spine (HCC) 04/09/2016   PAC (premature atrial contraction)  Restless leg    Syncope and collapse 04/09/2016   Past Surgical History:  Procedure Laterality Date   APPENDECTOMY     BIOPSY  09/29/2023   Procedure: BIOPSY;  Surgeon: Genell Ken, MD;  Location: [facility];  Service: Gastroenterology;;   breast lump removal Left    CERVICAL LAMINECTOMY     COLECTOMY WITH COLOSTOMY CREATION/HARTMANN PROCEDURE N/A 10/01/2023   Procedure: COLECTOMY WITH  COLOSTOMY CREATION/HARTMANN PROCEDURE;  Surgeon: Shela Derby, MD;  Location: [facility];  Service: General;  Laterality: N/A;   complete hysterectomy     CYSTOSCOPY WITH INDOCYANINE GREEN IMAGING (ICG) N/A 05/08/2024   Procedure: CYSTOSCOPY WITH INDOCYANINE GREEN IMAGING (ICG);  Surgeon: Thelbert Finner, MD;  Location: [facility];  Service: Urology;  Laterality: N/A;   EP IMPLANTABLE DEVICE N/A 04/10/2016   Procedure: Loop Recorder Insertion;  Surgeon: Tammie Fall, MD;  Location: [facility];  Service: Cardiovascular;  Laterality: N/A;   FLEXIBLE SIGMOIDOSCOPY N/A 09/29/2023   Procedure: FLEXIBLE SIGMOIDOSCOPY;  Surgeon: Genell Ken, MD;  Location: [facility];  Service: Gastroenterology;  Laterality: N/A;   FLEXIBLE SIGMOIDOSCOPY N/A 05/08/2024   Procedure: SIGMOIDOSCOPY, FLEXIBLE;  Surgeon: Candyce Champagne, MD;  Location: [facility];  Service: General;  Laterality: N/A;   HARVEST BONE GRAFT     IR IVC FILTER PLMT / S&I Dan Dun GUID/MOD SED  10/05/2023   IR SINUS/FIST TUBE CHK-NON GI  10/22/2023   IR SINUS/FIST TUBE CHK-NON GI  11/05/2023   LAPAROSCOPIC LYSIS OF ADHESIONS N/A 05/08/2024   Procedure: LYSIS, ADHESIONS, LAPAROSCOPIC;  Surgeon: Candyce Champagne, MD;  Location: [facility];  Service: General;  Laterality: N/A;  LYSIS OF ADHESIONS   LOOP RECORDER REMOVAL N/A 07/08/2018   Procedure: LOOP RECORDER REMOVAL;  Surgeon: Tammie Fall, MD;  Location: [facility];  Service: Cardiovascular;  Laterality: N/A;   XI ROBOTIC ASSISTED COLOSTOMY TAKEDOWN N/A 05/08/2024   Procedure: CLOSURE, COLOSTOMY, ROBOT-ASSISTED -ROBOTIC RECTOSIGMOID RESECTION (LAR) -TAKEDOWN OF END COLOSTOMY WITH ANASTOMOSIS -RESECTION OF SMALL INTESTINE WITH ANASTOMOSIS -SMALL BOWEL REPAIR -LYSIS OF ADHESIONS x 115 MINUTES (66% OF CASE),  -INTRAOPERATIVE ASSESSMENT OF TISSUE VASCULAR PERFUSION USING ICG (indocyanine green) IMMUNOFLUORESCENCE,  -TRANSVERSUS ABDOMINIS PLANE (TAP) BLOCK - BILATE   Social History:  reports that she  has never smoked. She has never used smokeless tobacco. She reports that she does not drink alcohol  and does not use drugs.  Allergies  Allergen Reactions   Elemental Sulfur Anaphylaxis    Family History  Problem Relation Age of Onset   Other Mother        thrombosis   Cancer Sister    Diabetes Sister    Stroke Daughter    Thyroid  disease Daughter     Prior to Admission medications   Medication Sig Start Date End Date Taking? Authorizing Provider  amLODipine  (NORVASC ) 5 MG tablet Take 1 tablet (5 mg total) by mouth daily. Patient taking differently: Take 5 mg by mouth at bedtime. 04/08/24  Yes Patwardhan, Manish J, MD  ascorbic acid  (VITAMIN C ) 250 MG CHEW Chew 250 mg by mouth daily.   Yes [provider]  aspirin EC 81 MG tablet Take 81 mg by mouth daily. Swallow whole.   Yes [provider]  cyanocobalamin (VITAMIN B12) 1000 MCG tablet Take 1,000 mcg by mouth daily.   Yes [provider]  FIBER GUMMIES PO Take 1 capsule by mouth daily.   Yes [provider]  Multiple Vitamin (MULTIVITAMIN WITH MINERALS) TABS tablet Take 1 tablet  by mouth daily. 11/10/23  Yes Kraig Peru, MD  Omega-3 Fatty Acids (OMEGA 3 500 PO) Take 500 mg by mouth daily.   Yes [provider]  OVER THE COUNTER MEDICATION Take 650 mg by mouth daily. Total Beets supplement   Yes [provider]  traMADol  (ULTRAM ) 50 MG tablet Take 1-2 tablets (50-100 mg total) by mouth every 6 (six) hours as needed for moderate pain (pain score 4-6) or severe pain (pain score 7-10). 05/08/24  Yes Candyce Champagne, MD    Physical Exam: Vitals:   05/09/24 1628 05/09/24 1730 05/09/24 1800 05/09/24 1825  BP: (S) 118/69 (!) 141/69 (!) 136/51 119/71  Pulse: (S) (!) 120 (!) 120 (!) 115 (!) 112  Resp:  16 12 15   Temp:  98 F (36.7 C)    TempSrc:      SpO2:  98% 96% 97%  Weight:  61.3 kg    Height:  5\' 5"  (1.651 m)     Constitutional: Mildly lethargic, pale. Eyes: PERRL,  conjunctivae normal ENMT: Mucous membranes are dry.  Neck: normal, supple, no masses, no thyromegaly Respiratory: clear to auscultation bilaterally, no wheezing, no crackles. Normal respiratory effort. No accessory muscle use.  Cardiovascular: Irregularly irregular, no murmurs / rubs / gallops. No extremity edema. 2+ pedal pulses. No carotid bruits.  Abdomen: Mild tenderness, dressing with some dark blood, no signs of active bleeding.  Mild swelling likely postop, no distention per se.  No masses palpated. No hepatosplenomegaly. Bowel sounds slow  Skin: no rashes, lesions, ulcers. No induration Neurologic: Alert and oriented x 3, no focal deficit. Psychiatric: Normal judgment and insight. Alert and oriented x 3. Normal mood.    Data Reviewed:  CBC shows WBC 19.7, neutrophil 17.0.  Chest x-ray with no acute abnormality.  Thank you very much for involving us  in the care of your patient.  Author: Dora Gallo, MD 05/09/2024 6:49 PM  For on call review [email or web address].

## 2024-05-09 NOTE — Plan of Care (Signed)
 Discussed with patient plan of care for the evening, pain management and medications for the evening with some teach back displayed.  Patient states she can't take Gabapentin/Neurotin due to it makes her jittery aware he was in the room).    Problem: Education: Goal: Understanding of discharge needs will improve Outcome: Progressing

## 2024-05-09 NOTE — Plan of Care (Signed)
  Problem: Activity: Goal: Ability to tolerate increased activity will improve Outcome: Progressing   Problem: Nutritional: Goal: Will attain and maintain optimal nutritional status will improve Outcome: Progressing

## 2024-05-09 NOTE — TOC Initial Note (Addendum)
 Transition of Care [facility]) - Initial/Assessment Note    Patient Details  Name: Elizabeth Odom MRN: [id number] Date of Birth: 26-Sep-1941  [facility] CM/SW Contact:    Levie Ream, RN Phone Number: 05/09/2024, 2:49 PM  Clinical Narrative:                 [name] w/ pt in room; pt says she lives at home; she plans to return at d/c; she identified POC grand daughter Genevive Ket [phone]); she will provide transportation; pt verified insurance/PCP; she denied SDOH risks; pt says she has cane, rolator, and shower chair; she does not have HH services, or home oxygen; order received for RW; awaiting PT/OT evals before ordering DME.  Expected Discharge Plan: Home/Self Care Barriers to Discharge: Continued Medical Work up   Patient Goals and CMS Choice Patient states their goals for this hospitalization and ongoing recovery are:: home CMS Medicare.gov Compare Post Acute Care list provided to:: Patient   [place] ownership interest in [facility].provided to:: Patient    Expected Discharge Plan and Services   Discharge Planning Services: CM Consult   Living arrangements for the past 2 months: Apartment                                      Prior Living Arrangements/Services Living arrangements for the past 2 months: Apartment Lives with:: Self Patient language and need for interpreter reviewed:: Yes Do you feel safe going back to the place where you live?: Yes      Need for Family Participation in Patient Care: Yes (Comment) Care giver support system in place?: Yes (comment) Current home services: DME (cane, rolator, shower chair) Criminal Activity/Legal Involvement Pertinent to Current Situation/Hospitalization: No - Comment as needed  Activities of Daily Living   ADL Screening (condition at time of admission) Independently performs ADLs?: Yes (appropriate for developmental age) Is the patient deaf or have difficulty hearing?:  No Does the patient have difficulty seeing, even when wearing glasses/contacts?: No Does the patient have difficulty concentrating, remembering, or making decisions?: No  Permission Sought/Granted Permission sought to share information with : Case Manager Permission granted to share information with : Yes, Verbal Permission Granted  Share Information with NAME: Case Manager     Permission granted to share info w Relationship: Genevive Ket (grand daughter) [phone]     Emotional Assessment Appearance:: Appears stated age Attitude/Demeanor/Rapport: Gracious Affect (typically observed): Accepting Orientation: : Oriented to Self, Oriented to Place, Oriented to  Time, Oriented to Situation Alcohol  / Substance Use: Not Applicable Psych Involvement: No (comment)  Admission diagnosis:  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) [K28.5, K56.699] Colostomy status (HCC) [Z93.3] Diverticulosis of large intestine without diverticulitis [K57.30] Diverticular disease [K57.90] Patient Active Problem List   Diagnosis Date Noted   History of anemia 05/08/2024   Pre-diabetes 05/08/2024   Diverticular disease 05/08/2024   Premature atrial contraction 04/08/2024   Mixed hyperlipidemia 04/08/2024   Essential hypertension 04/07/2024   Diverticular disease of left colon 04/07/2024   History of DVT (deep vein thrombosis) 04/07/2024   History of pulmonary embolus (PE) 04/07/2024   Presence of IVC filter 04/07/2024   Stricture of sigmoid colon (HCC) 04/07/2024   Irritant contact dermatitis associated with fecal stoma 03/10/2024   Colostomy care (HCC) 03/10/2024   Pancreatic cyst 01/02/2024   Hypoalbuminemia 01/02/2024   Acquired renal cyst of right  kidney 01/02/2024   Fatty liver 01/02/2024   Ambulates with cane 01/02/2024   Iron deficiency anemia due to chronic blood loss 10/31/2023   Acute pulmonary embolism (HCC) 10/31/2023   Hyponatremia 10/31/2023   Acute  urinary retention 10/31/2023   Malnutrition of moderate degree 10/17/2023   High risk medication use 10/16/2023   Pain 10/16/2023   Preop cardiovascular exam 10/16/2023   Pressure injury of skin 10/16/2023   Colon obstruction (HCC) 10/05/2023   Left leg DVT (HCC) 10/05/2023   ABLA (acute blood loss anemia) 10/05/2023   Palliative care encounter 10/05/2023   Counseling and coordination of care 10/05/2023   Need for emotional support 10/05/2023   Goals of care, counseling/discussion 10/05/2023   Acute colitis 09/27/2023   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   Restless leg    Multiple fractures of cervical spine (HCC) 04/09/2016   Cervical spine fracture, initial encounter 04/09/2016   Syncope and collapse 04/09/2016   A-fib (HCC) 04/09/2016   Acute head injury 04/09/2016   PCP:  Anthon Kins, MD Pharmacy:   [id number] - [place], [place] - [address] [place] [address] Phone: [phone] Fax: [phone]     Social Drivers of Health (SDOH) Social History: SDOH Screenings   Food Insecurity: No Food Insecurity (05/09/2024)  Housing: Low Risk  (05/09/2024)  Transportation Needs: No Transportation Needs (05/09/2024)  Utilities: Not At Risk (05/09/2024)  Depression (PHQ2-9): Low Risk  (01/02/2024)  Tobacco Use: Low Risk  (05/08/2024)   SDOH Interventions: Food Insecurity Interventions: Intervention Not Indicated, Inpatient TOC Housing Interventions: Intervention Not Indicated, Inpatient TOC Transportation Interventions: Intervention Not Indicated, Inpatient TOC Utilities Interventions: Intervention Not Indicated, Inpatient TOC   Readmission Risk Interventions    05/09/2024    2:46 PM 10/19/2023    6:13 PM  Readmission Risk Prevention Plan  Transportation Screening Complete Complete  PCP or Specialist Appt within 5-7 Days  Complete  PCP or Specialist Appt within 3-5 Days Complete   Home Care Screening   Complete  Medication Review (RN CM)  Complete  HRI or Home Care Consult Complete   Social Work Consult for Recovery Care Planning/Counseling Complete   Palliative Care Screening Not Applicable   Medication Review Oceanographer) Complete

## 2024-05-09 NOTE — Progress Notes (Signed)
 05/09/2024  Elizabeth Odom [id number] [birth year]  CARE TEAM: PCP: Anthon Kins, MD  Outpatient Care Team: Patient Care Team: Anthon Kins, MD as PCP - General (Internal Medicine) Cody Das, MD as PCP - Cardiology (Cardiology) Lajuan Pila, MD as Consulting Physician (Gastroenterology) Shela Derby, MD as Consulting Physician (General Surgery) Glory Larsen, MD (Neurology) Lahoma Pigg, MD as Consulting Physician (Urology) Hassan Links, MD as Consulting Physician (Cardiology) Candyce Champagne, MD as Consulting Physician (Colon and Rectal Surgery)  Inpatient Treatment Team: Treatment Team:  Candyce Champagne, MD Annell Kidney, RN [place], [place]   Problem List:   Principal Problem:   Diverticular disease   05/08/2024  POST-OPERATIVE DIAGNOSIS:   COLOSTOMY FOR RESECTION, DESIRE FOR OSTOMY TAKEDOWN RECTAL STRICTURE HISTORY OF DIVERTICULITIS WITH PERFORATION & ABSCESS   PROCEDURE:   -ROBOTIC RECTOSIGMOID RESECTION (LAR) -TAKEDOWN OF END COLOSTOMY WITH ANASTOMOSIS -RESECTION OF SMALL INTESTINE WITH ANASTOMOSIS -SMALL BOWEL REPAIR -LYSIS OF ADHESIONS x 115 MINUTES (66% OF CASE),  -INTRAOPERATIVE ASSESSMENT OF TISSUE VASCULAR PERFUSION USING ICG (indocyanine green ) IMMUNOFLUORESCENCE,  -TRANSVERSUS ABDOMINIS PLANE (TAP) BLOCK - BILATERAL -FLEXIBLE SIGMOIDOSCOPY   SURGEON:  Eddye Goodie, MD  OR FINDINGS:  Very dense adhesions of small bowel especially to pelvis and retroperitoneum.  Extremely concrete dense adhesions with friable tissue near her old prior small bowel anastomosis and distal jejunum.  Required small bowel repair and required small bowel resection including old anastomosis given severe tissues.  Patient had significant fibrotic stricturing of rectal stump at the mid/distal rectal junction.  Could not be released up.  Ended up doing resection of rectal stump to mid/distal rectal junction.  No obvious metastatic disease on  visceral parietal peritoneum or liver.   It is a 29mm EEA anastomosis ( distal descending colon  connected to mid/distal rectal junction.)  It rests 7 cm from the anal verge by rigid proctoscopy.  Assessment [facility] Stay = 1 days) 1 Day Post-Op    Struggling    Plan:  ERAS protocol.  Keep on clear liquids for now.  High risk for ileus given prolonged lyse adhesions and need for bowel resection.  Try and help and turning around.  Simethicone  scheduled.  Metoclopramide /erythromycin  scheduled to minimize ileus.  Continue alvimopan .  Continue fiber.  If has emesis Place NG tube and more aggressive bowel rest.  Follow one half maintenance IV fluids with back up PRN boluses.  Try and keep on the dry side but watch out for dehydration.  History of hypertension on amlodipine .  Low threshold to hold if needed.  PRN (as needed) backup if elevated.  -monitor electrolytes & replace as needed  Keep K>4, Mg>2, Phos>3  -VTE prophylaxis- SCDs.  Anticoagulation prophyllaxis SQ as appropriate  -mobilize as tolerated to help recovery.  Enlist therapies in moderate/high risk patients as appropriate  I updated the patient's status to the patient  Recommendations were made.  Questions were answered.  She expressed understanding & appreciation.  -Disposition:  Disposition:  The patient is from: Home Anticipate discharge to:  Home with Home Health  Possible SNF Anticipated Date of Discharge is:  June 3,2025   Barriers to discharge:  Pending Clinical improvement (more likely than not), Therapy assessment & Recommendations pending, and Consultant clearance & sign off    Patient currently is NOT MEDICALLY STABLE for discharge from the hospital from a surgery standpoint.      I reviewed last 24 h vitals and pain scores, last 48 h intake and output, last  24 h labs and trends, and last 24 h imaging results.  I have reviewed this patient's available data, including medical history, events of note,  test results, etc as part of my evaluation.   A significant portion of that time was spent in counseling. Care during the described time interval was provided by me.  This care required moderate level of medical decision making.  05/09/2024    Subjective: (Chief complaint)  Feeling nauseated and sore this morning.  Had a few small bowel movements of small clots only    Burping.  Not vomiting.  Objective:  Vital signs:  Vitals:   05/08/24 2032 05/08/24 2120 05/09/24 0137 05/09/24 0544  BP: (!) 109/55 (!) 118/55 127/64 121/65  Pulse: 72 80 80 85  Resp: 16 16 16 16   Temp: 98.4 F (36.9 C) 98.2 F (36.8 C) (!) 97.4 F (36.3 C) 98.2 F (36.8 C)  TempSrc: Oral  Oral Oral  SpO2: 96% 96% 97% 96%  Weight:      Height:           Intake/Output   Yesterday:  05/29 0701 - 05/30 0700 In: 1211 [P.O.:400; I.V.:677.5; IV Piggyback:133.5] Out: 1565 [Urine:910; Drains:555; Blood:100] This shift:  No intake/output data recorded.  Bowel function:  Flatus: No  BM:  No  Drain: Serosanguinous   Physical Exam:  General: Pt awake/alert in mild acute distress Eyes: PERRL, normal EOM.  Sclera clear.  No icterus Neuro: CN II-XII intact w/o focal sensory/motor deficits. Lymph: No head/neck/groin lymphadenopathy Psych:  No delerium/psychosis/paranoia.  Oriented x 4 HENT: Normocephalic, Mucus membranes moist.  No thrush Neck: Supple, No tracheal deviation.  No obvious thyromegaly Chest: No pain to chest wall compression.  Good respiratory excursion.  No audible wheezing CV:  Pulses intact.  Regular rhythm.  No major extremity edema MS: Normal AROM mjr joints.  No obvious deformity  Abdomen: Soft.  Moderately distended.  Nontender.  No evidence of peritonitis.  No incarcerated hernias.  Ext:   No deformity.  No mjr edema.  No cyanosis Skin: No petechiae / purpurea.  No major sores.  Warm and dry    Results:   Cultures: No results found for this or any previous visit (from  the past 720 hours).  Labs: Results for orders placed or performed during the hospital encounter of 05/08/24 (from the past 48 hours)  Basic metabolic panel     Status: Abnormal   Collection Time: 05/09/24  4:20 AM  Result Value Ref Range   Sodium 134 (L) 135 - 145 mmol/L   Potassium 3.8 3.5 - 5.1 mmol/L   Chloride 102 98 - 111 mmol/L   CO2 21 (L) 22 - 32 mmol/L   Glucose, Bld 207 (H) 70 - 99 mg/dL    Comment: Glucose reference range applies only to samples taken after fasting for at least 8 hours.   BUN 16 8 - 23 mg/dL   Creatinine, Ser 4.09 (H) 0.44 - 1.00 mg/dL   Calcium 8.4 (L) 8.9 - 10.3 mg/dL   GFR, Estimated 52 (L) >60 mL/min    Comment: (NOTE) Calculated using the CKD-EPI Creatinine Equation (2021)    Anion gap 11 5 - 15    Comment: Performed at [facility], [address], [place], [place] [address]  CBC     Status: Abnormal   Collection Time: 05/09/24  4:20 AM  Result Value Ref Range   WBC 14.0 (H) 4.0 - 10.5 K/uL   RBC 3.58 (L) 3.87 -  5.11 MIL/uL   Hemoglobin 11.3 (L) 12.0 - 15.0 g/dL   HCT 24.4 (L) 01.0 - 27.2 %   MCV 96.4 80.0 - 100.0 fL   MCH 31.6 26.0 - 34.0 pg   MCHC 32.8 30.0 - 36.0 g/dL   RDW 53.6 64.4 - 03.4 %   Platelets 280 150 - 400 K/uL   nRBC 0.0 0.0 - 0.2 %    Comment: Performed at [facility], [address] [place], [place] [address]  Magnesium      Status: Abnormal   Collection Time: 05/09/24  4:20 AM  Result Value Ref Range   Magnesium  1.6 (L) 1.7 - 2.4 mg/dL    Comment: Performed at [facility], [address] [place], [place] [address]    Imaging / Studies: DG C-Arm 1-60 Min-No Report Result Date: 05/08/2024 Fluoroscopy was utilized by the requesting physician.  No radiographic interpretation.    Medications / Allergies: per chart  Antibiotics: Anti-infectives (From admission, onward)    Start     Dose/Rate Route Frequency Ordered Stop   05/08/24 2200  cefoTEtan (CEFOTAN)  2 g in sodium chloride  0.9 % 100 mL IVPB        2 g 200 mL/hr over 30 Minutes Intravenous Every 12 hours 05/08/24 1759 05/08/24 2219   05/08/24 1400  neomycin (MYCIFRADIN) tablet 1,000 mg  Status:  Discontinued       Placed in "And" Linked Group   1,000 mg Oral 3 times per day 05/08/24 1119 05/08/24 1120   05/08/24 1400  metroNIDAZOLE (FLAGYL) tablet 1,000 mg  Status:  Discontinued       Placed in "And" Linked Group   1,000 mg Oral 3 times per day 05/08/24 1119 05/08/24 1120   05/08/24 1130  cefoTEtan (CEFOTAN) 2 g in sodium chloride  0.9 % 100 mL IVPB        2 g 200 mL/hr over 30 Minutes Intravenous On call to O.R. 05/08/24 1119 05/08/24 1233         Note: Portions of this report may have been transcribed using voice recognition software. Every effort was made to ensure accuracy; however, inadvertent computerized transcription errors may be present.   Any transcriptional errors that result from this process are unintentional.    Eddye Goodie, MD, FACS, MASCRS Esophageal, Gastrointestinal & Colorectal Surgery Robotic and Minimally Invasive Surgery  [facility] [address], [address] [place], [place] [address] [phone] Fax [phone] Main  CONTACT INFORMATION: Weekday (9AM-5PM): Call [facility] main office at [phone] Weeknight (5PM-9AM) or Weekend/Holiday: Check EPIC "Web Links" tab & use "AMION" (password " [email or web address]") for General Surgery [facility] coverage  Please, DO NOT use SecureChat  (it is not reliable communication to reach operating surgeons & will lead to a delay in care).   Epic staff messaging available for outptient concerns needing 1-2 business day response.      05/09/2024  7:04 AM

## 2024-05-09 NOTE — Progress Notes (Signed)
 Multiple meds administered at 1600 by day nurse (please note that all the same meds were given together in the morning, without the Dilaudid , and patient had NO issues). With this afternoon administration, with the added Dilaudid  0.5mg , the patient became extremely lethargic, unable to hold her eyes open, she was drooling out a Simethicone  chew, very slurred speech, could not hold herself or her head up. Called ICU Charge, Herman Longs, for a Rapid Response consult. Patient recovered some speech and eye opening, was deemed neuro intact, but still very lethargic and slurred. First set of vitals taken with this incident had a elevated HR 120, while being evaluated by [facility] her HR hit the 150s and seemed to jump around. An EKG was performed, result is in her chart, it was determined that she was in Afib RVR, patient has known Afib. MD was consulted and decision was made to transfer patient to [facility] for weekend monitoring and Tele. It was also noted at this time that her rectal discharge has progressed from dark red to frank bright red and the amount has increased to a golf ball size. This morning her abdomen was soft, it seems to be slightly distended now and trying to firm up a bit, definitely not hard, but not entirely soft either, as compared to earlier this morning. No new drainage on dressings at the time of transfer to Stepdown, all old drainage the same as noted in the morning. JP output not too significant at 50 for this day shift, drain was checked and stripped in the morning.   Prior to this incident patient had been progressing, she started out refusing to eat, very nauseous, hurting and refusing to move. After lots of discussion she began eating and with meds her nausea decreased, not totally gone, but better. With Tramadol  she was able to get up to the [facility] slowly but did well considering its POD 1, so overall she had been improving slowly throughout the day.

## 2024-05-10 ENCOUNTER — Encounter (HOSPITAL_COMMUNITY): Payer: Self-pay | Admitting: Surgery

## 2024-05-10 ENCOUNTER — Inpatient Hospital Stay (HOSPITAL_COMMUNITY)

## 2024-05-10 DIAGNOSIS — D62 Acute posthemorrhagic anemia: Secondary | ICD-10-CM

## 2024-05-10 DIAGNOSIS — I4891 Unspecified atrial fibrillation: Secondary | ICD-10-CM | POA: Diagnosis not present

## 2024-05-10 DIAGNOSIS — I10 Essential (primary) hypertension: Secondary | ICD-10-CM | POA: Diagnosis not present

## 2024-05-10 DIAGNOSIS — D72829 Elevated white blood cell count, unspecified: Secondary | ICD-10-CM | POA: Diagnosis not present

## 2024-05-10 DIAGNOSIS — K5790 Diverticulosis of intestine, part unspecified, without perforation or abscess without bleeding: Secondary | ICD-10-CM | POA: Diagnosis not present

## 2024-05-10 LAB — ECHOCARDIOGRAM COMPLETE
AR max vel: 2.58 cm2
AV Area VTI: 2.37 cm2
AV Area mean vel: 2.82 cm2
AV Mean grad: 5.1 mmHg
AV Peak grad: 11.4 mmHg
Ao pk vel: 1.69 m/s
Area-P 1/2: 3.65 cm2
Calc EF: 56.1 %
Height: 65 in
S' Lateral: 2.4 cm
Single Plane A2C EF: 54.8 %
Single Plane A4C EF: 57.2 %
Weight: 2127 [oz_av]

## 2024-05-10 LAB — BASIC METABOLIC PANEL WITH GFR
Anion gap: 5 (ref 5–15)
BUN: 27 mg/dL — ABNORMAL HIGH (ref 8–23)
CO2: 24 mmol/L (ref 22–32)
Calcium: 8.3 mg/dL — ABNORMAL LOW (ref 8.9–10.3)
Chloride: 103 mmol/L (ref 98–111)
Creatinine, Ser: 0.91 mg/dL (ref 0.44–1.00)
GFR, Estimated: >60 mL/min (ref >60)
Glucose, Bld: 168 mg/dL — ABNORMAL HIGH (ref 70–99)
Potassium: 4 mmol/L (ref 3.5–5.1)
Sodium: 132 mmol/L — ABNORMAL LOW (ref 135–145)

## 2024-05-10 LAB — CBC
HCT: 32.4 % — ABNORMAL LOW (ref 36.0–46.0)
Hemoglobin: 10.5 g/dL — ABNORMAL LOW (ref 12.0–15.0)
MCH: 31.2 pg (ref 26.0–34.0)
MCHC: 32.4 g/dL (ref 30.0–36.0)
MCV: 96.1 fL (ref 80.0–100.0)
Platelets: 294 10*3/uL (ref 150–400)
RBC: 3.37 MIL/uL — ABNORMAL LOW (ref 3.87–5.11)
RDW: 13.2 % (ref 11.5–15.5)
WBC: 18.4 10*3/uL — ABNORMAL HIGH (ref 4.0–10.5)
nRBC: 0 % (ref 0.0–0.2)

## 2024-05-10 LAB — CREATININE, SERUM
Creatinine, Ser: 1.1 mg/dL — ABNORMAL HIGH (ref 0.44–1.00)
GFR, Estimated: 50 mL/min — ABNORMAL LOW (ref >60)

## 2024-05-10 LAB — TSH: TSH: 2.327 u[IU]/mL (ref 0.350–4.500)

## 2024-05-10 LAB — MAGNESIUM: Magnesium: 2.1 mg/dL (ref 1.7–2.4)

## 2024-05-10 LAB — POTASSIUM: Potassium: 3.8 mmol/L (ref 3.5–5.1)

## 2024-05-10 MED ORDER — METOPROLOL TARTRATE 5 MG/5ML IV SOLN
2.5000 mg | Freq: Four times a day (QID) | INTRAVENOUS | Status: DC
  Administered 2024-05-10 – 2024-05-11 (×4): 2.5 mg via INTRAVENOUS
  Filled 2024-05-10 (×4): qty 5

## 2024-05-10 MED ORDER — MAGNESIUM SULFATE 2 GM/50ML IV SOLN: 2.0000 g | Freq: Once | INTRAVENOUS | Status: DC

## 2024-05-10 MED ORDER — METOPROLOL TARTRATE 5 MG/5ML IV SOLN
5.0000 mg | INTRAVENOUS | Status: AC | PRN
  Administered 2024-05-10 (×2): 5 mg via INTRAVENOUS
  Filled 2024-05-10 (×2): qty 5

## 2024-05-10 MED ORDER — HYDRALAZINE HCL 20 MG/ML IJ SOLN
5.0000 mg | Freq: Four times a day (QID) | INTRAMUSCULAR | Status: DC | PRN
Start: 2024-05-10 — End: 2024-06-25
  Administered 2024-06-06: 5 mg via INTRAVENOUS
  Filled 2024-05-10 (×2): qty 1

## 2024-05-10 NOTE — Progress Notes (Signed)
 PROGRESS NOTE   Elizabeth Odom  ZOX:[id number]    DOB: 1941-10-01    DOA: 05/08/2024  PCP: Anthon Kins, MD   I have briefly reviewed patients previous medical records in [facility].   Brief Hospital Course:  83 year old female, lives independently, medical history significant for remote history of A-fib evaluated with a loop recorder which was negative, at some point was on anticoagulation, had some bleeding and this was discontinued, anemia, PACs, HTN, mild cognitive impairment, syncope, large bowel obstruction prompting colon resection, creation 09/2020 and at that hospitalization found to have left leg DVT, small PE when given recent surgery with inability to use anticoagulation, had IVC filter placed that was subsequently removed, underwent elective robotic rectosigmoid resection, colostomy takedown with bowel anastomosis, extensive lysis of additions on 5/29 after preop cardiac clearance.  On 5/30 even in, patient developed AMS after polypharmacy along with A-fib with RVR and [facility] was consulted.  Patient was transferred to stepdown unit.  Received 2 doses of IV Cardizem  15 mg each and reverted to sinus rhythm after a couple of hours.  Cardiology consulted for assistance.   Assessment & Plan:   Postop transient A-fib with RVR/PVCs Remote history of A-fib not on rate control meds or anticoagulation preop Seen by Dr. Filiberto Hug in preop clearance on 04/08/2024 Likely stress response due to recent surgery, multiple meds, postop acute blood loss anemia. Received IV Cardizem  15 mg x 2 doses and reverted to sinus rhythm around 8:30 PM on 5/30.  Telemetry does show a few PVCs Most recent TTE on 10/01/2023: LVEF 70-75%, grade 1 diastolic dysfunction Continue telemetry, checking repeat TTE and TSH Consider low-dose beta-blocker i.e. metoprolol  12.5 Mg twice daily but will await cardiology input.  Do not think that she needs long-term anticoagulation but again will defer to  cardiology.  Essential hypertension On amlodipine  5 Mg daily PTA, currently on hold Consider resuming amlodipine  if blood pressures start to consistently increase >170 systolic.  Postop acute blood loss anemia Hemoglobin 13.8 on 5/27 which has gradually drifted down to 10.5. From [facility] surgical note, unable to make out how much EBL patient had. Anemia likely multifactorial due to surgical blood loss, hemodilution and acute illness Continue to follow daily CBCs and transfuse to keep hemoglobin >7 g per DL  Hypomagnesemia Magnesium  1.6 on 5/30 but does not appear to have been replaced Follow-up BMP and magnesium  this morning and replace electrolytes as needed Attempt to keep potassium >4 and magnesium  >2  Leukocytosis May be stress response.  No clinical concern for infection apart from recent abdominal surgery-defer antibiotics for that to primary service. On IV erythromycin  for GI dysmotility No clinical UTI.  Urine microscopy without concern for UTI.  Chest x-ray without pneumonia Follow daily CBCs.  S/p colostomy takedown Management per primary service.  Discussed with Dr. Melton Squires.  Body mass index is 22.12 kg/m.   DVT prophylaxis: enoxaparin  (LOVENOX ) injection 40 mg Start: 05/09/24 0800 SCD's Start: 05/08/24 1759     Code Status: Full Code:  Family Communication: None at bedside Disposition:  Status is: Inpatient Remains inpatient appropriate because: Remains in stepdown unit for additional 24 hours while recovering from recent transient A-fib with RVR, complex surgery and immediate postop.     Consultants:   [facility] are consultants I have consulted cardiology/Dr. Lavonne Prairie  Procedures:     Subjective:  Patient interviewed and examined this morning along with surgical attending at bedside.  Reports some throat discomfort from recent intubation for surgery.  Denies  chest pain, dyspnea, palpitations.  Objective:   Vitals:   05/10/24 0500 05/10/24 0600 05/10/24 0700  05/10/24 0800  BP: (!) 155/69 (!) 164/62 (!) 153/66 (!) 158/72  Pulse: 94 74 76 91  Resp: 19 16 18 18   Temp:      TempSrc:      SpO2: 97% 95% 95% 97%  Weight: 60.3 kg     Height:        General exam: Elderly female, moderately built and frail lying comfortably propped up in bed without distress.  Oral mucosa moist. Respiratory system: Clear to auscultation. Respiratory effort normal. Cardiovascular system: S1 & S2 heard, RRR. No JVD, murmurs, rubs, gallops or clicks. No pedal edema.  Telemetry personally reviewed: Had A-fib with mild RVR which reverted to sinus rhythm at approximately 8:30 PM on 5/30.  Few PVCs. Gastrointestinal system: Multiple surgical dressings in place.  RLQ abdominal drain.  Abdomen with minimal distention, soft, appropriate postop tenderness without peritoneal signs.  Bowel sounds present. Central nervous system: Alert and oriented. No focal neurological deficits. Extremities: Symmetric 5 x 5 power. Skin: No rashes, lesions or ulcers Psychiatry: Judgement and insight appear normal. Mood & affect appropriate.     Data Reviewed:   I have personally reviewed following labs and imaging studies   CBC: Recent Labs  Lab 05/09/24 0420 05/09/24 1817 05/10/24 0257  WBC 14.0* 19.7* 18.4*  NEUTROABS  --  17.0*  --   HGB 11.3* 11.4* 10.5*  HCT 34.5* 35.5* 32.4*  MCV 96.4 98.1 96.1  PLT 280 315 294    Basic Metabolic Panel: Recent Labs  Lab 05/06/24 1145 05/09/24 0420 05/10/24 0257  NA 138 134*  --   K 3.9 3.8 3.8  CL 99 102  --   CO2 28 21*  --   GLUCOSE 110* 207*  --   BUN 18 16  --   CREATININE 0.86 1.06* 1.10*  CALCIUM  10.0 8.4*  --   MG  --  1.6*  --     Liver Function Tests: Recent Labs  Lab 05/06/24 1145  AST 26  ALT 21  ALKPHOS 91  BILITOT 0.5  PROT 8.1  ALBUMIN  4.3    CBG: No results for input(s): "GLUCAP" in the last 168 hours.  Microbiology Studies:  No results found for this or any previous visit (from the past 240  hours).  Radiology Studies:  DG Chest Port 1 View Result Date: 05/09/2024 CLINICAL DATA:  AFib postop colon surgery EXAM: PORTABLE CHEST 1 VIEW COMPARISON:  09/20/2023 FINDINGS: No acute airspace disease or effusion. Cardiomediastinal silhouette within normal limits. Aortic atherosclerosis. Chest wall emphysema bilaterally. Small volume free air beneath the right diaphragm. IVC filter to the right of L3. IMPRESSION: 1. No active disease. 2. Small volume free air beneath the right diaphragm and bilateral chest wall emphysema, likely postsurgical. It Electronically Signed   By: Esmeralda Hedge M.D.   On: 05/09/2024 19:41   DG C-Arm 1-60 Min-No Report Result Date: 05/08/2024 Fluoroscopy was utilized by the requesting physician.  No radiographic interpretation.    Scheduled Meds:    acetaminophen   1,000 mg Oral Q6H   alvimopan   12 mg Oral BID   ascorbic acid   250 mg Oral Daily   aspirin  EC  81 mg Oral Daily   Chlorhexidine  Gluconate Cloth  6 each Topical Daily   cyanocobalamin   1,000 mcg Oral Daily   enoxaparin  (LOVENOX ) injection  40 mg Subcutaneous Q24H   famotidine   20 mg Oral Daily  feeding supplement  237 mL Oral BID BM   gabapentin   200 mg Oral QHS   methocarbamol  (ROBAXIN ) injection  500 mg Intravenous Q8H   metoCLOPramide  (REGLAN ) injection  5 mg Intravenous Q8H   multivitamin with minerals  1 tablet Oral Daily   polycarbophil  625 mg Oral BID   simethicone   80 mg Oral QID   sodium chloride  flush  3 mL Intravenous Q12H   sodium chloride  flush  3 mL Intravenous Q12H    Continuous Infusions:    sodium chloride      sodium chloride      erythromycin  250 mg (05/10/24 0703)   lactated ringers      lactated ringers      lactated ringers        LOS: 2 days     Aubrey Blas, MD,  FACP, [facility], [facility], [facility]   [facility] [place]      To contact the attending provider between 7A-7P or the covering provider during after hours 7P-7A, please  log into the web site [email or web address] and access using universal [place] password for that web site. If you do not have the password, please call the hospital operator.  05/10/2024, 8:01 AM

## 2024-05-10 NOTE — Progress Notes (Signed)
  Echocardiogram 2D Echocardiogram has been performed.  Elizabeth Odom 05/10/2024, 1:07 PM

## 2024-05-10 NOTE — Progress Notes (Signed)
 CARDIOLOGY CONSULT NOTE  Patient ID: Elizabeth Odom MRN: [id number] DOB/AGE: 1941/06/30 83 y.o.  Admit date: 05/08/2024 Primary Physician Anthon Kins, MD Primary Cardiologist Cody Das, MD  Chief Complaint  Atrial fib Requesting  Dr. Bess Broody.   HPI:  The patient has a history of partial resection of her colon with colostomy.  She was admitted with a robotic rectosigmoid resection takedown of end colostomy with anastomosis.  Postoperatively she developed rapid atrial fibrillation.  She was treated with IV Cardizem  x 2 doses and reverted to sinus rhythm but is back in fibrillation.  She had previously had a history of syncope and an implanted loop that did not demonstrate arrhythmias.  She had this removed in 2019.  The etiology of the syncope was never explained.  She has had a mention of previous  atrial fibrillation.  However, I see through all of her cardiology notes mention of atrial premature contractions but I do not see a confirmed diagnosis of fibrillation.  She had history of DVT and small PE following bowel surgery and for a while had an implanted IVC filter.  She did not be treated with anticoagulation at that time because of the recent surgery.    She was seen by us  preoperatively in April of this year.  No repeat cardiac testing was suggested.  She had had an echo in October 2024 that demonstrated normal left ventricular function and no significant abnormalities.  She is somnolent and seems somewhat fused but reports that she has not had any recent syncope.  She does not describe palpitations.  She says she lives by herself.  She is not having any chest discomfort, neck or arm discomfort.    Of note she is not really taking and keeping down p.o.'s.   Past Medical History:  Diagnosis Date   A-fib (HCC) 04/09/2016   Acute head injury 04/09/2016   Anemia    Blood dyscrasia    Hx DVT / PE   Cervical cancer (HCC)    Cervical spine fracture, initial encounter  04/09/2016   Claustrophobia    extremely claustrophobic   Dupuytren disease of palm of both hands    Dysrhythmia    PACs,  A.fib   Essential hypertension    Fatty liver    History of anemia    History of kidney stones    MCI (mild cognitive impairment) with memory loss    Multiple fractures of cervical spine (HCC) 04/09/2016   PAC (premature atrial contraction)    Restless leg    Syncope and collapse 04/09/2016    Past Surgical History:  Procedure Laterality Date   APPENDECTOMY     BIOPSY  09/29/2023   Procedure: BIOPSY;  Surgeon: Genell Ken, MD;  Location: [facility];  Service: Gastroenterology;;   breast lump removal Left    CERVICAL LAMINECTOMY     COLECTOMY WITH COLOSTOMY CREATION/HARTMANN PROCEDURE N/A 10/01/2023   Procedure: COLECTOMY WITH COLOSTOMY CREATION/HARTMANN PROCEDURE;  Surgeon: Shela Derby, MD;  Location: [facility];  Service: General;  Laterality: N/A;   complete hysterectomy     CYSTOSCOPY WITH INDOCYANINE GREEN  IMAGING (ICG) N/A 05/08/2024   Procedure: CYSTOSCOPY WITH INDOCYANINE GREEN  IMAGING (ICG);  Surgeon: Thelbert Finner, MD;  Location: [facility];  Service: Urology;  Laterality: N/A;   EP IMPLANTABLE DEVICE N/A 04/10/2016   Procedure: Loop Recorder Insertion;  Surgeon: Tammie Fall, MD;  Location: [facility];  Service: Cardiovascular;  Laterality: N/A;   FLEXIBLE SIGMOIDOSCOPY N/A  09/29/2023   Procedure: FLEXIBLE SIGMOIDOSCOPY;  Surgeon: Genell Ken, MD;  Location: [facility];  Service: Gastroenterology;  Laterality: N/A;   FLEXIBLE SIGMOIDOSCOPY N/A 05/08/2024   Procedure: SIGMOIDOSCOPY, FLEXIBLE;  Surgeon: Candyce Champagne, MD;  Location: [facility];  Service: General;  Laterality: N/A;   HARVEST BONE GRAFT     IR IVC FILTER PLMT / S&I Dan Dun GUID/MOD SED  10/05/2023   IR SINUS/FIST TUBE CHK-NON GI  10/22/2023   IR SINUS/FIST TUBE CHK-NON GI  11/05/2023   LAPAROSCOPIC LYSIS OF ADHESIONS N/A 05/08/2024   Procedure: LYSIS, ADHESIONS, LAPAROSCOPIC;   Surgeon: Candyce Champagne, MD;  Location: [facility];  Service: General;  Laterality: N/A;  LYSIS OF ADHESIONS   LOOP RECORDER REMOVAL N/A 07/08/2018   Procedure: LOOP RECORDER REMOVAL;  Surgeon: Tammie Fall, MD;  Location: [facility];  Service: Cardiovascular;  Laterality: N/A;   XI ROBOTIC ASSISTED COLOSTOMY TAKEDOWN N/A 05/08/2024   Procedure: CLOSURE, COLOSTOMY, ROBOT-ASSISTED -ROBOTIC RECTOSIGMOID RESECTION (LAR) -TAKEDOWN OF END COLOSTOMY WITH ANASTOMOSIS -RESECTION OF SMALL INTESTINE WITH ANASTOMOSIS -SMALL BOWEL REPAIR -LYSIS OF ADHESIONS x 115 MINUTES (66% OF CASE),  -INTRAOPERATIVE ASSESSMENT OF TISSUE VASCULAR PERFUSION USING ICG (indocyanine green ) IMMUNOFLUORESCENCE,  -TRANSVERSUS ABDOMINIS PLANE (TAP) BLOCK - BILATE    Allergies  Allergen Reactions   Elemental Sulfur Anaphylaxis   Medications Prior to Admission  Medication Sig Dispense Refill Last Dose/Taking   amLODipine  (NORVASC ) 5 MG tablet Take 1 tablet (5 mg total) by mouth daily. (Patient taking differently: Take 5 mg by mouth at bedtime.) 90 tablet 3 05/07/2024 Evening   ascorbic acid  (VITAMIN C ) 250 MG CHEW Chew 250 mg by mouth daily.   Past Week   aspirin  EC 81 MG tablet Take 81 mg by mouth daily. Swallow whole.   Past Week   cyanocobalamin  (VITAMIN B12) 1000 MCG tablet Take 1,000 mcg by mouth daily.   Past Week   FIBER GUMMIES PO Take 1 capsule by mouth daily.   Past Week   Multiple Vitamin (MULTIVITAMIN WITH MINERALS) TABS tablet Take 1 tablet by mouth daily. 120 tablet 0 Past Week   Omega-3 Fatty Acids (OMEGA 3 500 PO) Take 500 mg by mouth daily.   Past Week   OVER THE COUNTER MEDICATION Take 650 mg by mouth daily. Total Beets supplement   Past Week   Family History  Problem Relation Age of Onset   Other Mother        thrombosis   Cancer Sister    Diabetes Sister    Stroke Daughter    Thyroid  disease Daughter     Social History   Socioeconomic History   Marital status: Widowed    Spouse name: Not on file    Number of children: 3   Years of education: schooling in [place]   Highest education level: Not on file  Occupational History   Not on file  Tobacco Use   Smoking status: Never   Smokeless tobacco: Never  Vaping Use   Vaping status: Never Used  Substance and Sexual Activity   Alcohol  use: Never    Alcohol /week: 0.0 standard drinks of alcohol    Drug use: Never   Sexual activity: Not Currently  Other Topics Concern   Not on file  Social History Narrative   Patient is originally from [place], [place].   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    Her emergency contact/medical decision maker is her granddaughter:  Nellie Banas  Gabriel John [phone]).   Social Drivers of Corporate investment banker Strain: Not on file  Food Insecurity: No Food Insecurity (05/09/2024)   Hunger Vital Sign    Worried About Running Out of Food in the Last Year: Never true    Ran Out of Food in the Last Year: Never true  Transportation Needs: No Transportation Needs (05/09/2024)   PRAPARE - Administrator, Civil Service (Medical): No    Lack of Transportation (Non-Medical): No  Physical Activity: Not on file  Stress: Not on file  Social Connections: Unknown (05/09/2024)   Social Connection and Isolation Panel [NHANES]    Frequency of Communication with Friends and Family: Three times a week    Frequency of Social Gatherings with Friends and Family: Once a week    Attends Religious Services: 1 to 4 times per year    Active Member of [employer] or Organizations: Yes    Attends Banker Meetings: 1 to 4 times per year    Marital Status: Patient declined  Intimate Partner Violence: Not At Risk (05/09/2024)   Humiliation, Afraid, Rape, and Kick questionnaire    Fear of Current or Ex-Partner: No    Emotionally Abused: No    Physically Abused: No    Sexually Abused: No     ROS:   Unable to obtain secondary to somnolence  confusion postop state  Physical Exam: Blood pressure (!) 158/72, pulse 91, temperature 98 F (36.7 C), resp. rate 18, height 5\' 5"  (1.651 m), weight 60.3 kg, SpO2 97%.  GENERAL: Chronically ill appearing HEENT:  Pupils equal round and reactive, fundi not visualized, oral mucosa unremarkable NECK:  No jugular venous distention, waveform within normal limits, carotid upstroke brisk and symmetric, no bruits, no thyromegaly LUNGS: Decreased breath sounds bilaterally BACK:  No CVA tenderness CHEST:  Unremarkable HEART:  PMI not displaced or sustained,S1 and S2 within normal limits, no S3,  no clicks, no rubs, no murmurs, irregular ABD: Absent bowel sounds drainage tubes in place postop surgical EXT:  2 plus pulses throughout, no edema, no cyanosis no clubbing SKIN:  No rashes no nodules NEURO: Moves all extremities PSYCH: Confused   Labs: Lab Results  Component Value Date   BUN 27 (H) 05/10/2024   Lab Results  Component Value Date   CREATININE 0.91 05/10/2024   Lab Results  Component Value Date   NA 132 (L) 05/10/2024   K 4.0 05/10/2024   CL 103 05/10/2024   CO2 24 05/10/2024   No results found for: "TROPONINI" Lab Results  Component Value Date   WBC 18.4 (H) 05/10/2024   HGB 10.5 (L) 05/10/2024   HCT 32.4 (L) 05/10/2024   MCV 96.1 05/10/2024   PLT 294 05/10/2024   Lab Results  Component Value Date   CHOL 196 04/08/2024   HDL 59 04/08/2024   LDLCALC 122 (H) 04/08/2024   TRIG 85 04/08/2024   CHOLHDL 3.3 04/08/2024   Lab Results  Component Value Date   ALT 21 05/06/2024   AST 26 05/06/2024   ALKPHOS 91 05/06/2024   BILITOT 0.5 05/06/2024      Radiology:   CXR: 1) No active disease. 2. Small volume free air beneath the right diaphragm and bilateral chest wall emphysema, likely postsurgical. It  EKG: Atrial fibrillation, baseline artifact precludes adequate analysis.  Rate 120   ASSESSMENT AND PLAN:   Atrial fib: I will start with IV beta-blocker  scheduled but we could use IV Cardizem  if she does  not rate control.  I suspect she will have paroxysms.  This point with her postop I would not suggest anticoagulation.  Is not a role for amiodarone at this point.  Supplement magnesium .  HTN: Blood pressure is up slightly.  This can be managed in the context of treating a rapid rate with IV beta-blockers.   SignedEilleen Grates 05/10/2024, 8:20 AM

## 2024-05-10 NOTE — Progress Notes (Signed)
 2 Days Post-Op   Subjective/Chief Complaint: Patient sent to stepdown unit for A-fib Patient otherwise doing well this a.m.  Tolerating fulls. Passing flatus no bowel movement.   Objective: Vital signs in last 24 hours: Temp:  [97.5 F (36.4 C)-98 F (36.7 C)] 98 F (36.7 C) (05/31 0400) Pulse Rate:  [51-120] 76 (05/31 0700) Resp:  [12-22] 18 (05/31 0700) BP: (118-173)/(46-88) 153/66 (05/31 0700) SpO2:  [94 %-98 %] 95 % (05/31 0700) Weight:  [60.3 kg-61.3 kg] 60.3 kg (05/31 0500) Last BM Date : 05/09/24  Intake/Output from previous day: 05/30 0701 - 05/31 0700 In: 2404 [P.O.:630; I.V.:1503.5; IV Piggyback:270.4] Out: 828 [Urine:525; Drains:303] Intake/Output this shift: Total I/O In: -  Out: 490 [Urine:460; Drains:30]  PE:  Constitutional: No acute distress, conversant, appears states age. Eyes: Anicteric sclerae, moist conjunctiva, no lid lag Lungs: Clear to auscultation bilaterally, normal respiratory effort CV: regular rate and rhythm, no murmurs, no peripheral edema, pedal pulses 2+ GI: Soft, no masses or hepatosplenomegaly, non-tender to palpation, inc c/d/I, Jp SS Skin: No rashes, palpation reveals normal turgor Psychiatric: appropriate judgment and insight, oriented to person, place, and time   Lab Results:  Recent Labs    05/09/24 1817 05/10/24 0257  WBC 19.7* 18.4*  HGB 11.4* 10.5*  HCT 35.5* 32.4*  PLT 315 294   BMET Recent Labs    05/09/24 0420 05/10/24 0257  NA 134*  --   K 3.8 3.8  CL 102  --   CO2 21*  --   GLUCOSE 207*  --   BUN 16  --   CREATININE 1.06* 1.10*  CALCIUM  8.4*  --    PT/INR No results for input(s): "LABPROT", "INR" in the last 72 hours. ABG No results for input(s): "PHART", "HCO3" in the last 72 hours.  Invalid input(s): "PCO2", "PO2"  Studies/Results: DG Chest Port 1 View Result Date: 05/09/2024 CLINICAL DATA:  AFib postop colon surgery EXAM: PORTABLE CHEST 1 VIEW COMPARISON:  09/20/2023 FINDINGS: No acute  airspace disease or effusion. Cardiomediastinal silhouette within normal limits. Aortic atherosclerosis. Chest wall emphysema bilaterally. Small volume free air beneath the right diaphragm. IVC filter to the right of L3. IMPRESSION: 1. No active disease. 2. Small volume free air beneath the right diaphragm and bilateral chest wall emphysema, likely postsurgical. It Electronically Signed   By: Esmeralda Hedge M.D.   On: 05/09/2024 19:41   DG C-Arm 1-60 Min-No Report Result Date: 05/08/2024 Fluoroscopy was utilized by the requesting physician.  No radiographic interpretation.    Anti-infectives: Anti-infectives (From admission, onward)    Start     Dose/Rate Route Frequency Ordered Stop   05/09/24 0900  erythromycin  250 mg in sodium chloride  0.9 % 100 mL IVPB        250 mg 100 mL/hr over 60 Minutes Intravenous Every 8 hours 05/09/24 0732 05/11/24 0559   05/08/24 2200  cefoTEtan  (CEFOTAN ) 2 g in sodium chloride  0.9 % 100 mL IVPB        2 g 200 mL/hr over 30 Minutes Intravenous Every 12 hours 05/08/24 1759 05/09/24 0749   05/08/24 1400  neomycin  (MYCIFRADIN ) tablet 1,000 mg  Status:  Discontinued       Placed in "And" Linked Group   1,000 mg Oral 3 times per day 05/08/24 1119 05/08/24 1120   05/08/24 1400  metroNIDAZOLE  (FLAGYL ) tablet 1,000 mg  Status:  Discontinued       Placed in "And" Linked Group   1,000 mg Oral 3 times per day 05/08/24  1119 05/08/24 1120   05/08/24 1130  cefoTEtan  (CEFOTAN ) 2 g in sodium chloride  0.9 % 100 mL IVPB        2 g 200 mL/hr over 30 Minutes Intravenous On call to O.R. 05/08/24 1119 05/09/24 0749       Assessment/Plan: POD #2 S/p LOA, colostomy takedown, small bowel resection with anastomosis. -Pt with PVCs, appreciate Medicine input -Con't PO as tol -trend Cr -labs pending -mobilize  LOS: 2 days    Shela Derby 05/10/2024

## 2024-05-11 ENCOUNTER — Inpatient Hospital Stay (HOSPITAL_COMMUNITY)

## 2024-05-11 DIAGNOSIS — R0600 Dyspnea, unspecified: Secondary | ICD-10-CM | POA: Diagnosis not present

## 2024-05-11 DIAGNOSIS — R4020 Unspecified coma: Secondary | ICD-10-CM | POA: Diagnosis not present

## 2024-05-11 DIAGNOSIS — I4891 Unspecified atrial fibrillation: Secondary | ICD-10-CM | POA: Diagnosis not present

## 2024-05-11 DIAGNOSIS — K625 Hemorrhage of anus and rectum: Secondary | ICD-10-CM | POA: Diagnosis not present

## 2024-05-11 DIAGNOSIS — K5790 Diverticulosis of intestine, part unspecified, without perforation or abscess without bleeding: Secondary | ICD-10-CM | POA: Diagnosis not present

## 2024-05-11 LAB — BASIC METABOLIC PANEL WITH GFR
Anion gap: 8 (ref 5–15)
BUN: 27 mg/dL — ABNORMAL HIGH (ref 8–23)
CO2: 24 mmol/L (ref 22–32)
Calcium: 8.6 mg/dL — ABNORMAL LOW (ref 8.9–10.3)
Chloride: 102 mmol/L (ref 98–111)
Creatinine, Ser: 0.7 mg/dL (ref 0.44–1.00)
GFR, Estimated: >60 mL/min (ref >60)
Glucose, Bld: 145 mg/dL — ABNORMAL HIGH (ref 70–99)
Potassium: 4.2 mmol/L (ref 3.5–5.1)
Sodium: 134 mmol/L — ABNORMAL LOW (ref 135–145)

## 2024-05-11 LAB — CBC
HCT: 32.8 % — ABNORMAL LOW (ref 36.0–46.0)
HCT: 33.9 % — ABNORMAL LOW (ref 36.0–46.0)
Hemoglobin: 10.5 g/dL — ABNORMAL LOW (ref 12.0–15.0)
Hemoglobin: 11.1 g/dL — ABNORMAL LOW (ref 12.0–15.0)
MCH: 31 pg (ref 26.0–34.0)
MCH: 31.1 pg (ref 26.0–34.0)
MCHC: 32 g/dL (ref 30.0–36.0)
MCHC: 32.7 g/dL (ref 30.0–36.0)
MCV: 96.8 fL (ref 80.0–100.0)
MCV: 95 fL (ref 80.0–100.0)
Platelets: 323 10*3/uL (ref 150–400)
Platelets: 368 10*3/uL (ref 150–400)
RBC: 3.39 MIL/uL — ABNORMAL LOW (ref 3.87–5.11)
RBC: 3.57 MIL/uL — ABNORMAL LOW (ref 3.87–5.11)
RDW: 13.5 % (ref 11.5–15.5)
RDW: 13.6 % (ref 11.5–15.5)
WBC: 13 10*3/uL — ABNORMAL HIGH (ref 4.0–10.5)
WBC: 11 10*3/uL — ABNORMAL HIGH (ref 4.0–10.5)
nRBC: 0 % (ref 0.0–0.2)
nRBC: 0 % (ref 0.0–0.2)

## 2024-05-11 LAB — URINE CULTURE: Culture: <10000 — AB

## 2024-05-11 MED ORDER — METOPROLOL TARTRATE 5 MG/5ML IV SOLN
5.0000 mg | Freq: Four times a day (QID) | INTRAVENOUS | Status: DC
  Administered 2024-05-11 – 2024-05-13 (×7): 5 mg via INTRAVENOUS
  Filled 2024-05-11 (×7): qty 5

## 2024-05-11 MED ORDER — METOPROLOL TARTRATE 5 MG/5ML IV SOLN: 5.0000 mg | INTRAVENOUS | Status: DC | PRN

## 2024-05-11 MED ORDER — SODIUM CHLORIDE 0.9 % IV SOLN: INTRAVENOUS | Status: AC

## 2024-05-11 MED ORDER — OXYCODONE HCL 5 MG PO TABS
10.0000 mg | ORAL_TABLET | ORAL | Status: DC | PRN
  Administered 2024-05-11 – 2024-05-12 (×2): 10 mg via ORAL
  Filled 2024-05-11 (×2): qty 2

## 2024-05-11 MED ORDER — SODIUM CHLORIDE 0.9 % IV BOLUS
500.0000 mL | Freq: Once | INTRAVENOUS | Status: AC
  Administered 2024-05-11: 500 mL via INTRAVENOUS

## 2024-05-11 MED ORDER — HYDROMORPHONE HCL 1 MG/ML IJ SOLN: 0.5000 mg | INTRAMUSCULAR | Status: DC | PRN

## 2024-05-11 MED ORDER — METOPROLOL TARTRATE 5 MG/5ML IV SOLN
5.0000 mg | INTRAVENOUS | Status: AC | PRN
  Administered 2024-05-11: 5 mg via INTRAVENOUS
  Filled 2024-05-11: qty 5

## 2024-05-11 MED ORDER — DILTIAZEM HCL 25 MG/5ML IV SOLN
15.0000 mg | Freq: Once | INTRAVENOUS | Status: AC
  Administered 2024-05-11: 15 mg via INTRAVENOUS
  Filled 2024-05-11: qty 5

## 2024-05-11 NOTE — Evaluation (Signed)
 Occupational Therapy Evaluation Patient Details Name: Elizabeth Odom MRN: [id number] DOB: June 05, 1941 Today's Date: 05/11/2024   History of Present Illness   83 y.o. female s/p rectosigmoid resection takedown on 5/29. 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     Clinical Impressions Pt admitted with the above diagnoses and presents with below problem list. Pt will benefit from continued acute OT to address the below listed deficits and maximize independence with basic ADLs prior to d/c. Pt received on [facility] requesting urgent assistance back to bed "I feel like I'm going to pass out. Hurry!" BP 74/35 on BSC, BP 99/53 during transfer, 104/56 supine in bed after a few minutes. Pt needed mod assist +2 for safety to return to bed. Pt also reporting she was very thirsty and offered water  (cleared with RN first). Pt resting in bed at end of session, reports increased abdominal pain, nursing notified of pt request for pain med.       If plan is discharge home, recommend the following:         Functional Status Assessment   Patient has had a recent decline in their functional status and demonstrates the ability to make significant improvements in function in a reasonable and predictable amount of time.     Equipment Recommendations   Other (comment) (TBD, may need shower seat)     Recommendations for Other Services         Precautions/Restrictions   Precautions Precautions: Fall Restrictions Weight Bearing Restrictions Per Provider Order: No     Mobility Bed Mobility Overal bed mobility: Needs Assistance Bed Mobility: Sit to Supine       Sit to supine: Mod assist, +2 for safety/equipment   General bed mobility comments: assist to advance BLE onto  bed. Pt experiencing syptomatic orthostatic hypotenstion.    Transfers Overall transfer level: Needs assistance Equipment used: Rolling walker (2 wheels) Transfers: Sit to/from Stand, Bed to chair/wheelchair/BSC Sit to Stand: Mod assist, +2 safety/equipment Stand pivot transfers: Mod assist, +2 safety/equipment         General transfer comment: BSC to EOB. Pt experiencing symptomatic orthostatic hypotension.      Balance Overall balance assessment: Needs assistance Sitting-balance support: Bilateral upper extremity supported, Feet supported Sitting balance-Leahy Scale: Fair     Standing balance support: Bilateral upper extremity supported, During functional activity, Reliant on assistive device for balance Standing balance-Leahy Scale: Poor                             ADL either performed or assessed with clinical judgement   ADL                                               Vision         Perception         Praxis         Pertinent Vitals/Pain Pain Assessment Pain Assessment: Faces Faces Pain Scale: Hurts even more Pain Location: abdomen Pain Descriptors / Indicators: Grimacing, Discomfort Pain Intervention(s): Limited activity within patient's tolerance, Monitored during session, Repositioned, Patient requesting pain meds-RN notified     Extremity/Trunk Assessment Upper  Extremity Assessment Upper Extremity Assessment: Generalized weakness   Lower Extremity Assessment Lower Extremity Assessment: Defer to PT evaluation       Communication Communication Communication: Other (comment);No apparent difficulties (HOH?)   Cognition Arousal: Alert Behavior During Therapy: WFL for tasks assessed/performed, Flat affect Cognition: No apparent impairments                               Following commands: Intact       Cueing  General Comments   Cueing Techniques: Verbal cues;Gestural cues  Pt on BSC upon  arrival of OT/PT. Pt requesting urgent assist back to bed, symptomatic orthostatic hypotension.   Exercises     Shoulder Instructions      Home Living Family/patient expects to be discharged to:: Private residence Living Arrangements: Alone   Type of Home: Independent living facility Home Access: Level entry     Home Layout: One level               Home Equipment: Cane - single Librarian, academic (2 wheels);Rollator (4 wheels)          Prior Functioning/Environment Prior Level of Function : Independent/Modified Independent;Driving                    OT Problem List: Decreased strength;Decreased activity tolerance;Impaired balance (sitting and/or standing);Decreased knowledge of precautions;Decreased knowledge of use of DME or AE;Cardiopulmonary status limiting activity;Pain   OT Treatment/Interventions: Self-care/ADL training;Therapeutic exercise;Energy conservation;DME and/or AE instruction;Therapeutic activities;Balance training;Patient/family education      OT Goals(Current goals can be found in the care plan section)   Acute Rehab OT Goals Patient Stated Goal: not stated OT Goal Formulation: With patient Time For Goal Achievement: 05/25/24 Potential to Achieve Goals: Good   OT Frequency:  Min 2X/week    Co-evaluation PT/OT/SLP Co-Evaluation/Treatment: Yes Reason for Co-Treatment: Complexity of the patient's impairments (multi-system involvement);For patient/therapist safety;To address functional/ADL transfers   OT goals addressed during session: ADL's and self-care;Strengthening/ROM;Proper use of Adaptive equipment and DME      AM-PAC OT "6 Clicks" Daily Activity     Outcome Measure Help from another person eating meals?: A Little Help from another person taking care of personal grooming?: A Little Help from another person toileting, which includes using toliet, bedpan, or urinal?: A Lot Help from another person bathing (including washing,  rinsing, drying)?: A Lot Help from another person to put on and taking off regular upper body clothing?: A Little Help from another person to put on and taking off regular lower body clothing?: A Lot 6 Click Score: 15   End of Session Equipment Utilized During Treatment: Rolling walker (2 wheels) Nurse Communication: Mobility status;Other (comment);Precautions (orthostatic)  Activity Tolerance: Other (comment) (symptomatic orthostatic hypotension) Patient left: in bed;with call bell/phone within reach;with bed alarm set (pt declined SCDs)  OT Visit Diagnosis: Unsteadiness on feet (R26.81);Pain;Muscle weakness (generalized) (M62.81)                Time: 1308-6578 OT Time Calculation (min): 13 min Charges:  OT General Charges $OT Visit: 1 Visit OT Evaluation $OT Eval Moderate Complexity: 1 Mod  Lael Pierce, OT Acute Rehabilitation Services Office: [phone]   Brinton Canavan 05/11/2024, 10:43 AM

## 2024-05-11 NOTE — Progress Notes (Signed)
     Patient Name: Elizabeth Odom           DOB: [birth year]  MRN: [id number]      Admission Date: 05/08/2024  Attending Provider: Candyce Champagne, MD  Primary Diagnosis: Diverticular disease   Level of care: Stepdown   OVERNIGHT PROGRESS REPORT   Atrial Fibrillation with RVR; HR 120-170's Notified of uncontrolled rate, HR ~ 120-170s. Hemodynamically stable, SBP 120-140s. Patient is on scheduled IV Lopressor  2.5 mg every 6 hours.    Will try an additional 5 mg IV Lopressor  to help with rate control.  If this fails to sustain controlled rate, will try IV Cardizem  push as long as BP is stable.     Addendum- 0400- A-fib with RVR, HR sustaining 130s.  BP stable RN to administer 15 mg IV Cardizem .   Keyla Milone, DNP, ACNPC- AG [facility] [place]

## 2024-05-11 NOTE — Progress Notes (Signed)
 3 Days Post-Op   Subjective/Chief Complaint: Pt with afib and RVR overnight Controlled HR this AM Having some bloody output    Objective: Vital signs in last 24 hours: Temp:  [97.8 F (36.6 C)-98.7 F (37.1 C)] 98.7 F (37.1 C) (06/01 0000) Pulse Rate:  [71-145] 89 (06/01 0700) Resp:  [9-22] 10 (06/01 0700) BP: (115-162)/(44-100) 118/54 (06/01 0700) SpO2:  [91 %-97 %] 95 % (06/01 0700) Weight:  [59.6 kg] 59.6 kg (06/01 0404) Last BM Date : 05/09/24  Intake/Output from previous day: 05/31 0701 - 06/01 0700 In: 363.1 [I.V.:63.2; IV Piggyback:299.9] Out: 1325 [Urine:1180; Drains:145] Intake/Output this shift: No intake/output data recorded.  PE:  Constitutional: No acute distress, conversant, appears states age. Eyes: Anicteric sclerae, moist conjunctiva, no lid lag Lungs: Clear to auscultation bilaterally, normal respiratory effort CV: regular rate and rhythm, no murmurs, no peripheral edema, pedal pulses 2+ GI: Soft, no masses or hepatosplenomegaly, non-tender to palpation, JP SS, inc c/d/i Skin: No rashes, palpation reveals normal turgor Psychiatric: appropriate judgment and insight, oriented to person, place, and time   Lab Results:  Recent Labs    05/10/24 0257 05/11/24 0233  WBC 18.4* 13.0*  HGB 10.5* 10.5*  HCT 32.4* 32.8*  PLT 294 323   BMET Recent Labs    05/10/24 0737 05/11/24 0233  NA 132* 134*  K 4.0 4.2  CL 103 102  CO2 24 24  GLUCOSE 168* 145*  BUN 27* 27*  CREATININE 0.91 0.70  CALCIUM  8.3* 8.6*   PT/INR No results for input(s): "LABPROT", "INR" in the last 72 hours. ABG No results for input(s): "PHART", "HCO3" in the last 72 hours.  Invalid input(s): "PCO2", "PO2"  Studies/Results: ECHOCARDIOGRAM COMPLETE Result Date: 05/10/2024    ECHOCARDIOGRAM REPORT   Patient Name:   Elizabeth Odom Date of Exam: 05/10/2024 Medical Rec #:  [id number]      Height:       65.0 in Accession #:    [id number]     Weight:       132.9 lb Date of Birth:   1941/09/19     BSA:          1.663 m Patient Age:    83 years       BP:           158/72 mmHg Patient Gender: F              HR:           95 bpm. Exam Location:  Inpatient Procedure: 2D Echo, Cardiac Doppler and Color Doppler (Both Spectral and Color            Flow Doppler were utilized during procedure). Indications:    I48.91* Unspeicified atrial fibrillation  History:        Patient has prior history of Echocardiogram examinations, most                 recent 10/01/2023. Abnormal ECG, Arrythmias:Atrial Fibrillation,                 Signs/Symptoms:Altered Mental Status; Risk Factors:Hypertension                 and Dyslipidemia. Polmonary embolus. IVC filter.  Sonographer:    Raynelle Callow RDCS Referring Phys: [id number] ANAND D HONGALGI IMPRESSIONS  1. Left ventricular ejection fraction, by estimation, is 60 to 65%. The left ventricle has normal function. The left ventricle has no regional wall motion abnormalities. Left ventricular diastolic parameters are indeterminate.  2. Right ventricular systolic function is normal. The right ventricular size is normal. There is normal pulmonary artery systolic pressure.  3. The mitral valve was not well visualized. Mild mitral valve regurgitation. No evidence of mitral stenosis.  4. The tricuspid valve is abnormal. Tricuspid valve regurgitation is mild to moderate.  5. The aortic valve is tricuspid. Aortic valve regurgitation is not visualized. No aortic stenosis is present.  6. The inferior vena cava is normal in size with greater than 50% respiratory variability, suggesting right atrial pressure of 3 mmHg. FINDINGS  Left Ventricle: Left ventricular ejection fraction, by estimation, is 60 to 65%. The left ventricle has normal function. The left ventricle has no regional wall motion abnormalities. The left ventricular internal cavity size was normal in size. There is  no left ventricular hypertrophy. Left ventricular diastolic parameters are indeterminate. Right Ventricle: The  right ventricular size is normal. No increase in right ventricular wall thickness. Right ventricular systolic function is normal. There is normal pulmonary artery systolic pressure. The tricuspid regurgitant velocity is 2.58 m/s, and  with an assumed right atrial pressure of 8 mmHg, the estimated right ventricular systolic pressure is 34.6 mmHg. Left Atrium: Left atrial size was normal in size. Right Atrium: Right atrial size was normal in size. Pericardium: There is no evidence of pericardial effusion. Mitral Valve: The mitral valve was not well visualized. Mild mitral valve regurgitation. No evidence of mitral valve stenosis. Tricuspid Valve: The tricuspid valve is abnormal. Tricuspid valve regurgitation is mild to moderate. No evidence of tricuspid stenosis. Aortic Valve: The aortic valve is tricuspid. Aortic valve regurgitation is not visualized. No aortic stenosis is present. Aortic valve mean gradient measures 5.1 mmHg. Aortic valve peak gradient measures 11.4 mmHg. Aortic valve area, by VTI measures 2.37  cm. Pulmonic Valve: The pulmonic valve was normal in structure. Pulmonic valve regurgitation is not visualized. No evidence of pulmonic stenosis. Aorta: The aortic root and ascending aorta are structurally normal, with no evidence of dilitation. Venous: The inferior vena cava is normal in size with greater than 50% respiratory variability, suggesting right atrial pressure of 3 mmHg. IAS/Shunts: No atrial level shunt detected by color flow Doppler.  LEFT VENTRICLE PLAX 2D LVIDd:         3.60 cm LVIDs:         2.40 cm LV PW:         1.00 cm LV IVS:        1.00 cm LVOT diam:     2.30 cm LV SV:         57 LV SV Index:   34 LVOT Area:     4.15 cm  LV Volumes (MOD) LV vol d, MOD A2C: 61.0 ml LV vol d, MOD A4C: 49.1 ml LV vol s, MOD A2C: 27.6 ml LV vol s, MOD A4C: 21.0 ml LV SV MOD A2C:     33.4 ml LV SV MOD A4C:     49.1 ml LV SV MOD BP:      33.9 ml RIGHT VENTRICLE             IVC RV S prime:     18.00 cm/s   IVC diam: 2.10 cm TAPSE (M-mode): 1.3 cm LEFT ATRIUM           Index        RIGHT ATRIUM           Index LA diam:      2.00 cm 1.20 cm/m   RA  Area:     14.60 cm LA Vol (A2C): 9.6 ml  5.79 ml/m   RA Volume:   30.80 ml  18.52 ml/m LA Vol (A4C): 17.6 ml 10.58 ml/m  AORTIC VALVE AV Area (Vmax):    2.58 cm AV Area (Vmean):   2.82 cm AV Area (VTI):     2.37 cm AV Vmax:           168.92 cm/s AV Vmean:          103.287 cm/s AV VTI:            0.240 m AV Peak Grad:      11.4 mmHg AV Mean Grad:      5.1 mmHg LVOT Vmax:         105.00 cm/s LVOT Vmean:        70.100 cm/s LVOT VTI:          0.137 m LVOT/AV VTI ratio: 0.57  AORTA Ao Root diam: 3.20 cm Ao Asc diam:  3.50 cm MITRAL VALVE               TRICUSPID VALVE MV Area (PHT): 3.65 cm    TR Peak grad:   26.6 mmHg MV Decel Time: 208 msec    TR Vmax:        258.00 cm/s MV E velocity: 85.20 cm/s                            SHUNTS                            Systemic VTI:  0.14 m                            Systemic Diam: 2.30 cm Armida Lander MD Electronically signed by Armida Lander MD Signature Date/Time: 05/10/2024/6:42:49 PM    Final    DG Chest Port 1 View Result Date: 05/09/2024 CLINICAL DATA:  AFib postop colon surgery EXAM: PORTABLE CHEST 1 VIEW COMPARISON:  09/20/2023 FINDINGS: No acute airspace disease or effusion. Cardiomediastinal silhouette within normal limits. Aortic atherosclerosis. Chest wall emphysema bilaterally. Small volume free air beneath the right diaphragm. IVC filter to the right of L3. IMPRESSION: 1. No active disease. 2. Small volume free air beneath the right diaphragm and bilateral chest wall emphysema, likely postsurgical. It Electronically Signed   By: Esmeralda Hedge M.D.   On: 05/09/2024 19:41    Anti-infectives: Anti-infectives (From admission, onward)    Start     Dose/Rate Route Frequency Ordered Stop   05/09/24 0900  erythromycin  250 mg in sodium chloride  0.9 % 100 mL IVPB        250 mg 100 mL/hr over 60 Minutes Intravenous  Every 8 hours 05/09/24 0732 05/11/24 0044   05/08/24 2200  cefoTEtan  (CEFOTAN ) 2 g in sodium chloride  0.9 % 100 mL IVPB        2 g 200 mL/hr over 30 Minutes Intravenous Every 12 hours 05/08/24 1759 05/09/24 0749   05/08/24 1400  neomycin  (MYCIFRADIN ) tablet 1,000 mg  Status:  Discontinued       Placed in "And" Linked Group   1,000 mg Oral 3 times per day 05/08/24 1119 05/08/24 1120   05/08/24 1400  metroNIDAZOLE  (FLAGYL ) tablet 1,000 mg  Status:  Discontinued       Placed in "And" Linked Group   1,000  mg Oral 3 times per day 05/08/24 1119 05/08/24 1120   05/08/24 1130  cefoTEtan  (CEFOTAN ) 2 g in sodium chloride  0.9 % 100 mL IVPB        2 g 200 mL/hr over 30 Minutes Intravenous On call to O.R. 05/08/24 1119 05/09/24 0749       Assessment/Plan: s/p Procedure(s) with comments: CLOSURE, COLOSTOMY, ROBOT-ASSISTED -ROBOTIC RECTOSIGMOID RESECTION (LAR) -TAKEDOWN OF END COLOSTOMY WITH ANASTOMOSIS -RESECTION OF SMALL INTESTINE WITH ANASTOMOSIS -SMALL BOWEL REPAIR -LYSIS OF ADHESIONS x 115 MINUTES (66% OF CASE),  -INTRAOPERATIVE ASSESSMENT OF TISSUE VASCULAR PERFUSION USING ICG (indocyanine green ) IMMUNOFLUORESCENCE,  -TRANSVERSUS ABDOMINIS PLANE (TAP) BLOCK - BILATERAL (N/A) - ROBOTIC OSTOMY TAKEDOWN LYSIS, ADHESIONS, LAPAROSCOPIC (N/A) - LYSIS OF ADHESIONS SIGMOIDOSCOPY, FLEXIBLE (N/A) CYSTOSCOPY WITH INDOCYANINE GREEN  IMAGING (ICG) (N/A) POD #3 S/p LOA, colostomy takedown, small bowel resection with anastomosis. -Pt with PVCs, appreciate Medicine/Cards input, currently rate controlled -Con't PO as tol, OK to crush meds -mobilize -f/u H/H this AM.  If con't with bloody GI output may have to hold DVT prophlaxis  LOS: 3 days    Shela Derby 05/11/2024

## 2024-05-11 NOTE — Plan of Care (Signed)

## 2024-05-11 NOTE — Progress Notes (Addendum)
 PROGRESS NOTE   Elizabeth Odom  ZOX:[id number]    DOB: Jun 25, 1941    DOA: 05/08/2024  PCP: Anthon Kins, MD   I have briefly reviewed patients previous medical records in [facility].   Brief Hospital Course:  83 year old female, lives independently, medical history significant for remote history of A-fib evaluated with a loop recorder which was negative, at some point was on anticoagulation, had some bleeding and this was discontinued, anemia, PACs, HTN, mild cognitive impairment, syncope, large bowel obstruction prompting colon resection, creation 09/2020 and at that hospitalization found to have left leg DVT, small PE when given recent surgery with inability to use anticoagulation, had IVC filter placed that was subsequently removed, underwent elective robotic rectosigmoid resection, colostomy takedown with bowel anastomosis, extensive lysis of additions on 5/29 after preop cardiac clearance.  On 5/30 even in, patient developed AMS after polypharmacy along with A-fib with RVR and [facility] was consulted.  Patient was transferred to stepdown unit.  Received 2 doses of IV Cardizem  15 mg each and reverted to sinus rhythm after a couple of hours.  Cardiology consulted for assistance.   Assessment & Plan:   Postop A-fib with RVR/PVCs Remote history of A-fib not on rate control meds or anticoagulation preop Seen by Dr. Filiberto Hug in preop clearance on 04/08/2024 Likely stress response due to recent surgery, multiple meds, postop acute blood loss anemia. After initial IV Cardizem  x 2 doses, briefly was in sinus rhythm but then reverted back to A-fib/atrial flutter with RVR. TTE 10/31 with preserved LVEF/60-65% with no abnormalities.  TSH normal. Continue telemetry, checking repeat TTE and TSH Cardiology input appreciated.  That started her on IV metoprolol  2.5 Mg every 6 hours.  However despite this, rate not controlled, got couple doses of as needed IV metoprolol  5 mg yesterday, a dose at 2 AM  and also an additional dose of IV Cardizem  15 mg at 4 AM. Overnight A-fib/flutter mostly controlled ventricular rates with periodic and transient RVR up to 110-150s Increased metoprolol  to 5 Mg IV every 6 hours with holding parameters.  Likely needs Cardizem , probably short acting p.o. versus IV infusion. No anticoagulation indicated per cardiology.  Await cardiology follow-up for changes.  Essential hypertension On amlodipine  5 Mg daily PTA, currently on hold Blood pressures controlled.  Most recent SBP's in the 110s.  Postop acute blood loss anemia Hemoglobin 13.8 on 5/27 which has gradually drifted down to 10.5. From [facility] surgical note, unable to make out how much EBL patient had. Anemia likely multifactorial due to surgical blood loss, hemodilution and acute illness Continue to follow daily CBCs and transfuse to keep hemoglobin >7 g per DL Stable.  Hypomagnesemia Replaced Attempt to keep potassium >4 and magnesium  >2  Leukocytosis May be stress response.  No clinical concern for infection apart from recent abdominal surgery-defer antibiotics for that to primary service. On IV erythromycin  for GI dysmotility No clinical UTI.  Urine microscopy without concern for UTI.  Chest x-ray without pneumonia Improved  S/p colostomy takedown Management per primary service.    Mild delirium Expected.  Overnight apparently mildly confused intermittently but no agitation. Coherent this morning.  Delirium precautions.  Minimize opioids and sedatives.  Mobilize as tolerated.  Body mass index is 21.87 kg/m.   DVT prophylaxis: enoxaparin  (LOVENOX ) injection 40 mg Start: 05/09/24 0800 SCD's Start: 05/08/24 1759     Code Status: Full Code:  Family Communication: None at bedside Disposition:  Status is: Inpatient Remains inpatient appropriate because: Remains in stepdown  unit for additional 24 hours due to uncontrolled A-fib/flutter with RVR.     Consultants:   [facility] are  consultants Cardiology/Dr. Lavonne Prairie  Procedures:     Subjective:  Per nursing report some bloody oozing in the genital/perineal area, unable to make out from where.  Intermittent mild confusion overnight without agitation.  Tolerating p.o.'s.  Patient reports that she feels good.  Mild surgical site pain.  Denies chest pain, cough, dyspnea or palpitations.  Objective:   Vitals:   05/11/24 0400 05/11/24 0404 05/11/24 0500 05/11/24 0600  BP: (!) 142/66  122/69 115/61  Pulse:   97 91  Resp: (!) 22  11 10   Temp:      TempSrc:      SpO2: 93%  95% 94%  Weight:  59.6 kg    Height:        General exam: Elderly female, moderately built and frail lying comfortably propped up in bed without distress.  Oral mucosa moist. Respiratory system: Clear to auscultation.  No increased work of breathing. Cardiovascular system: S1 & S2 heard, RRR. No JVD, murmurs, rubs, gallops or clicks. No pedal edema.  Telemetry metric personally reviewed: Atrial flutter/fib with mostly controlled ventricular rate with periodic RVR's in the 110s-150s Gastrointestinal system: Multiple surgical dressings in place, clean and dry.  RLQ abdominal drain.  Abdomen non distended, soft, appropriate postop tenderness without peritoneal signs.  Bowel sounds present. Central nervous system: Alert and oriented. No focal neurological deficits.  Coherent and answers questions appropriately. Extremities: Symmetric 5 x 5 power. Skin: No rashes, lesions or ulcers Psychiatry: Judgement and insight appear normal. Mood & affect appropriate.     Data Reviewed:   I have personally reviewed following labs and imaging studies   CBC: Recent Labs  Lab 05/09/24 1817 05/10/24 0257 05/11/24 0233  WBC 19.7* 18.4* 13.0*  NEUTROABS 17.0*  --   --   HGB 11.4* 10.5* 10.5*  HCT 35.5* 32.4* 32.8*  MCV 98.1 96.1 96.8  PLT 315 294 323    Basic Metabolic Panel: Recent Labs  Lab 05/06/24 1145 05/09/24 0420 05/10/24 0257  05/10/24 0737 05/11/24 0233  NA 138 134*  --  132* 134*  K 3.9 3.8 3.8 4.0 4.2  CL 99 102  --  103 102  CO2 28 21*  --  24 24  GLUCOSE 110* 207*  --  168* 145*  BUN 18 16  --  27* 27*  CREATININE 0.86 1.06* 1.10* 0.91 0.70  CALCIUM  10.0 8.4*  --  8.3* 8.6*  MG  --  1.6*  --  2.1  --     Liver Function Tests: Recent Labs  Lab 05/06/24 1145  AST 26  ALT 21  ALKPHOS 91  BILITOT 0.5  PROT 8.1  ALBUMIN  4.3    CBG: No results for input(s): "GLUCAP" in the last 168 hours.  Microbiology Studies:  No results found for this or any previous visit (from the past 240 hours).  Radiology Studies:  ECHOCARDIOGRAM COMPLETE Result Date: 05/10/2024    ECHOCARDIOGRAM REPORT   Patient Name:   Elizabeth Odom Date of Exam: 05/10/2024 Medical Rec #:  [id number]      Height:       65.0 in Accession #:    [id number]     Weight:       132.9 lb Date of Birth:  [birth year]     BSA:          1.663 m Patient Age:    17  years       BP:           158/72 mmHg Patient Gender: F              HR:           95 bpm. Exam Location:  Inpatient Procedure: 2D Echo, Cardiac Doppler and Color Doppler (Both Spectral and Color            Flow Doppler were utilized during procedure). Indications:    I48.91* Unspeicified atrial fibrillation  History:        Patient has prior history of Echocardiogram examinations, most                 recent 10/01/2023. Abnormal ECG, Arrythmias:Atrial Fibrillation,                 Signs/Symptoms:Altered Mental Status; Risk Factors:Hypertension                 and Dyslipidemia. Polmonary embolus. IVC filter.  Sonographer:    Raynelle Callow RDCS Referring Phys: [phone] Helder Crisafulli D Yasmin Dibello IMPRESSIONS  1. Left ventricular ejection fraction, by estimation, is 60 to 65%. The left ventricle has normal function. The left ventricle has no regional wall motion abnormalities. Left ventricular diastolic parameters are indeterminate.  2. Right ventricular systolic function is normal. The right ventricular size is normal.  There is normal pulmonary artery systolic pressure.  3. The mitral valve was not well visualized. Mild mitral valve regurgitation. No evidence of mitral stenosis.  4. The tricuspid valve is abnormal. Tricuspid valve regurgitation is mild to moderate.  5. The aortic valve is tricuspid. Aortic valve regurgitation is not visualized. No aortic stenosis is present.  6. The inferior vena cava is normal in size with greater than 50% respiratory variability, suggesting right atrial pressure of 3 mmHg. FINDINGS  Left Ventricle: Left ventricular ejection fraction, by estimation, is 60 to 65%. The left ventricle has normal function. The left ventricle has no regional wall motion abnormalities. The left ventricular internal cavity size was normal in size. There is  no left ventricular hypertrophy. Left ventricular diastolic parameters are indeterminate. Right Ventricle: The right ventricular size is normal. No increase in right ventricular wall thickness. Right ventricular systolic function is normal. There is normal pulmonary artery systolic pressure. The tricuspid regurgitant velocity is 2.58 m/s, and  with an assumed right atrial pressure of 8 mmHg, the estimated right ventricular systolic pressure is 34.6 mmHg. Left Atrium: Left atrial size was normal in size. Right Atrium: Right atrial size was normal in size. Pericardium: There is no evidence of pericardial effusion. Mitral Valve: The mitral valve was not well visualized. Mild mitral valve regurgitation. No evidence of mitral valve stenosis. Tricuspid Valve: The tricuspid valve is abnormal. Tricuspid valve regurgitation is mild to moderate. No evidence of tricuspid stenosis. Aortic Valve: The aortic valve is tricuspid. Aortic valve regurgitation is not visualized. No aortic stenosis is present. Aortic valve mean gradient measures 5.1 mmHg. Aortic valve peak gradient measures 11.4 mmHg. Aortic valve area, by VTI measures 2.37  cm. Pulmonic Valve: The pulmonic valve was  normal in structure. Pulmonic valve regurgitation is not visualized. No evidence of pulmonic stenosis. Aorta: The aortic root and ascending aorta are structurally normal, with no evidence of dilitation. Venous: The inferior vena cava is normal in size with greater than 50% respiratory variability, suggesting right atrial pressure of 3 mmHg. IAS/Shunts: No atrial level shunt detected by color flow Doppler.  LEFT VENTRICLE PLAX 2D  LVIDd:         3.60 cm LVIDs:         2.40 cm LV PW:         1.00 cm LV IVS:        1.00 cm LVOT diam:     2.30 cm LV SV:         57 LV SV Index:   34 LVOT Area:     4.15 cm  LV Volumes (MOD) LV vol d, MOD A2C: 61.0 ml LV vol d, MOD A4C: 49.1 ml LV vol s, MOD A2C: 27.6 ml LV vol s, MOD A4C: 21.0 ml LV SV MOD A2C:     33.4 ml LV SV MOD A4C:     49.1 ml LV SV MOD BP:      33.9 ml RIGHT VENTRICLE             IVC RV S prime:     18.00 cm/s  IVC diam: 2.10 cm TAPSE (M-mode): 1.3 cm LEFT ATRIUM           Index        RIGHT ATRIUM           Index LA diam:      2.00 cm 1.20 cm/m   RA Area:     14.60 cm LA Vol (A2C): 9.6 ml  5.79 ml/m   RA Volume:   30.80 ml  18.52 ml/m LA Vol (A4C): 17.6 ml 10.58 ml/m  AORTIC VALVE AV Area (Vmax):    2.58 cm AV Area (Vmean):   2.82 cm AV Area (VTI):     2.37 cm AV Vmax:           168.92 cm/s AV Vmean:          103.287 cm/s AV VTI:            0.240 m AV Peak Grad:      11.4 mmHg AV Mean Grad:      5.1 mmHg LVOT Vmax:         105.00 cm/s LVOT Vmean:        70.100 cm/s LVOT VTI:          0.137 m LVOT/AV VTI ratio: 0.57  AORTA Ao Root diam: 3.20 cm Ao Asc diam:  3.50 cm MITRAL VALVE               TRICUSPID VALVE MV Area (PHT): 3.65 cm    TR Peak grad:   26.6 mmHg MV Decel Time: 208 msec    TR Vmax:        258.00 cm/s MV E velocity: 85.20 cm/s                            SHUNTS                            Systemic VTI:  0.14 m                            Systemic Diam: 2.30 cm Armida Lander MD Electronically signed by Armida Lander MD Signature Date/Time:  05/10/2024/6:42:49 PM    Final    DG Chest Port 1 View Result Date: 05/09/2024 CLINICAL DATA:  AFib postop colon surgery EXAM: PORTABLE CHEST 1 VIEW COMPARISON:  09/20/2023 FINDINGS: No acute airspace disease or effusion. Cardiomediastinal silhouette within normal limits.  Aortic atherosclerosis. Chest wall emphysema bilaterally. Small volume free air beneath the right diaphragm. IVC filter to the right of L3. IMPRESSION: 1. No active disease. 2. Small volume free air beneath the right diaphragm and bilateral chest wall emphysema, likely postsurgical. It Electronically Signed   By: Esmeralda Hedge M.D.   On: 05/09/2024 19:41    Scheduled Meds:    acetaminophen   1,000 mg Oral Q6H   alvimopan   12 mg Oral BID   ascorbic acid   250 mg Oral Daily   aspirin  EC  81 mg Oral Daily   Chlorhexidine  Gluconate Cloth  6 each Topical Daily   cyanocobalamin   1,000 mcg Oral Daily   enoxaparin  (LOVENOX ) injection  40 mg Subcutaneous Q24H   famotidine   20 mg Oral Daily   feeding supplement  237 mL Oral BID BM   gabapentin   200 mg Oral QHS   methocarbamol  (ROBAXIN ) injection  500 mg Intravenous Q8H   metoprolol  tartrate  2.5 mg Intravenous Q6H   multivitamin with minerals  1 tablet Oral Daily   polycarbophil  625 mg Oral BID   simethicone   80 mg Oral QID   sodium chloride  flush  3 mL Intravenous Q12H   sodium chloride  flush  3 mL Intravenous Q12H    Continuous Infusions:    sodium chloride      sodium chloride      lactated ringers        LOS: 3 days     Aubrey Blas, MD,  FACP, [facility], [facility], [facility]   [facility] [place]      To contact the attending provider between 7A-7P or the covering provider during after hours 7P-7A, please log into the web site [email or web address] and access using universal [place] password for that web site. If you do not have the password, please call the hospital operator.  05/11/2024, 7:28 AM

## 2024-05-11 NOTE — Evaluation (Signed)
 Physical Therapy Evaluation Patient Details Name: Elizabeth Odom MRN: [id number] DOB: 07/13/1941 Today's Date: 05/11/2024  History of Present Illness  83 y.o. female s/p rectosigmoid resection takedown on 5/29. 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  Clinical Impression  Pt admitted as above and presenting with functional mobility limitations 2* generalized weakness, post op pain, balance deficits and decreased activity tolerance.  Pt hopes to progress to dc back to IND living apartment and resume previous lifestyle.  This date, Pt received on [facility] requesting urgent assistance back to bed "I feel like I'm going to pass out. Hurry!" BP 74/35 on BSC, BP 99/53 during transfer, 104/56 supine in bed after a few minutes. Pt needed mod assist +2 for safety to return to bed. Pt also reporting she was very thirsty and offered water  (cleared with RN first). Pt resting in bed at end of session, reports increased abdominal pain, nursing notified of pt request for pain med.      If plan is discharge home, recommend the following: A little help with walking and/or transfers;A little help with bathing/dressing/bathroom;Assistance with cooking/housework;Assist for transportation;Help with stairs or ramp for entrance   Can travel by private vehicle        Equipment Recommendations None recommended by PT  Recommendations for Other Services       Functional Status Assessment Patient has had a recent decline in their functional status and demonstrates the ability to make significant improvements in function in a reasonable and predictable amount of time.     Precautions / Restrictions Precautions Precautions: Fall Restrictions Weight Bearing Restrictions Per Provider Order: No       Mobility  Bed Mobility Overal bed mobility: Needs Assistance Bed Mobility: Sit to Supine       Sit to supine: Mod assist, +2 for safety/equipment   General bed mobility comments: assist to advance BLE onto bed. Pt experiencing syptomatic orthostatic hypotenstion.    Transfers Overall transfer level: Needs assistance Equipment used: Rolling walker (2 wheels) Transfers: Sit to/from Stand, Bed to chair/wheelchair/BSC Sit to Stand: +2 safety/equipment, Min assist, Mod assist   Step pivot transfers: Min assist, Mod assist, +2 physical assistance, +2 safety/equipment       General transfer comment: BSC to EOB. Pt experiencing symptomatic orthostatic hypotension.    Ambulation/Gait               General Gait Details: deferred 2* orthostatic hypotension  Stairs            Wheelchair Mobility     Tilt Bed    Modified Rankin (Stroke Patients Only)       Balance Overall balance assessment: Needs assistance Sitting-balance support: Bilateral upper extremity supported, Feet supported Sitting balance-Leahy Scale: Fair     Standing balance support: Bilateral upper extremity supported, During functional activity, Reliant on assistive device for balance Standing balance-Leahy Scale: Poor                               Pertinent Vitals/Pain Pain Assessment Pain Assessment: Faces Faces Pain Scale: Hurts even more Pain Location: L side abdominal area Pain Descriptors / Indicators: Grimacing, Discomfort Pain Intervention(s): Limited activity within patient's tolerance, Monitored during session, Patient requesting pain meds-RN notified    Home Living Family/patient  expects to be discharged to:: Private residence Living Arrangements: Alone   Type of Home: Independent living facility Home Access: Level entry       Home Layout: One level Home Equipment: Cane - single Librarian, academic (2 wheels);Rollator (4 wheels)      Prior Function Prior  Level of Function : Independent/Modified Independent;Driving                     Extremity/Trunk Assessment   Upper Extremity Assessment Upper Extremity Assessment: Generalized weakness    Lower Extremity Assessment Lower Extremity Assessment: Generalized weakness    Cervical / Trunk Assessment Cervical / Trunk Assessment: Kyphotic  Communication   Communication Communication: Other (comment);No apparent difficulties (HOH?)    Cognition Arousal: Alert Behavior During Therapy: WFL for tasks assessed/performed, Flat affect                             Following commands: Intact       Cueing Cueing Techniques: Verbal cues, Gestural cues     General Comments General comments (skin integrity, edema, etc.): Pt on BSC on arrival to room    Exercises     Assessment/Plan    PT Assessment Patient needs continued PT services  PT Problem List Decreased strength;Decreased activity tolerance;Decreased balance;Decreased mobility;Decreased knowledge of use of DME;Pain;Other (comment) (orthostatic)       PT Treatment Interventions DME instruction;Gait training;Stair training;Therapeutic activities;Functional mobility training;Therapeutic exercise;Patient/family education    PT Goals (Current goals can be found in the Care Plan section)  Acute Rehab PT Goals Patient Stated Goal: Regain IND PT Goal Formulation: With patient Time For Goal Achievement: 05/25/24 Potential to Achieve Goals: Good    Frequency Min 3X/week     Co-evaluation PT/OT/SLP Co-Evaluation/Treatment: Yes Reason for Co-Treatment: Complexity of the patient's impairments (multi-system involvement);For patient/therapist safety;To address functional/ADL transfers PT goals addressed during session: Mobility/safety with mobility OT goals addressed during session: ADL's and self-care;Strengthening/ROM;Proper use of Adaptive equipment and DME       AM-PAC PT "6 Clicks" Mobility  Outcome  Measure Help needed turning from your back to your side while in a flat bed without using bedrails?: A Lot Help needed moving from lying on your back to sitting on the side of a flat bed without using bedrails?: A Lot Help needed moving to and from a bed to a chair (including a wheelchair)?: A Lot Help needed standing up from a chair using your arms (e.g., wheelchair or bedside chair)?: A Lot Help needed to walk in hospital room?: Total Help needed climbing 3-5 steps with a railing? : Total 6 Click Score: 10    End of Session Equipment Utilized During Treatment: Gait belt Activity Tolerance: Other (comment) (orthostatic) Patient left: in bed;with call bell/phone within reach Nurse Communication: Mobility status PT Visit Diagnosis: Unsteadiness on feet (R26.81);Muscle weakness (generalized) (M62.81);Difficulty in walking, not elsewhere classified (R26.2);Pain    Time: 1011-1025 PT Time Calculation (min) (ACUTE ONLY): 14 min   Charges:   PT Evaluation $PT Eval Low Complexity: 1 Low   PT General Charges $$ ACUTE PT VISIT: 1 Visit         Thedora Finlay PT Acute Rehabilitation Services Pager [phone] Office [phone]   Jahlani Lorentz 05/11/2024, 12:09 PM

## 2024-05-11 NOTE — Progress Notes (Signed)
 Progress Note  Patient Name: Elizabeth Odom Date of Encounter: 05/11/2024  Primary Cardiologist:   Cody Das, MD   Subjective   Some abdominal pain.  No SOB.  Does not feel her heart rate.   Inpatient Medications    Scheduled Meds:  acetaminophen   1,000 mg Oral Q6H   alvimopan   12 mg Oral BID   ascorbic acid   250 mg Oral Daily   aspirin  EC  81 mg Oral Daily   Chlorhexidine  Gluconate Cloth  6 each Topical Daily   cyanocobalamin   1,000 mcg Oral Daily   enoxaparin  (LOVENOX ) injection  40 mg Subcutaneous Q24H   famotidine   20 mg Oral Daily   feeding supplement  237 mL Oral BID BM   gabapentin   200 mg Oral QHS   methocarbamol  (ROBAXIN ) injection  500 mg Intravenous Q8H   metoprolol  tartrate  5 mg Intravenous Q6H   multivitamin with minerals  1 tablet Oral Daily   polycarbophil  625 mg Oral BID   simethicone   80 mg Oral QID   sodium chloride  flush  3 mL Intravenous Q12H   sodium chloride  flush  3 mL Intravenous Q12H   Continuous Infusions:  sodium chloride      sodium chloride      PRN Meds: sodium chloride , sodium chloride , alum & mag hydroxide-simeth, diphenhydrAMINE  **OR** diphenhydrAMINE , hydrALAZINE , HYDROmorphone  (DILAUDID ) injection, magic mouthwash, melatonin, menthol -cetylpyridinium, methocarbamol , naphazoline-glycerin , ondansetron  **OR** ondansetron  (ZOFRAN ) IV, mouth rinse, phenol, prochlorperazine  **OR** prochlorperazine , simethicone , sodium chloride , sodium chloride  flush, sodium chloride  flush, traMADol    Vital Signs    Vitals:   05/11/24 0404 05/11/24 0500 05/11/24 0600 05/11/24 0700  BP:  122/69 115/61 (!) 118/54  Pulse:  97 91 89  Resp:  11 10 10   Temp:      TempSrc:      SpO2:  95% 94% 95%  Weight: 59.6 kg     Height:        Intake/Output Summary (Last 24 hours) at 05/11/2024 0753 Last data filed at 05/11/2024 0600 Gross per 24 hour  Intake 363.14 ml  Output 835 ml  Net -471.86 ml   Filed Weights   05/09/24 1730 05/10/24 0500 05/11/24  0404  Weight: 61.3 kg 60.3 kg 59.6 kg    Telemetry    Atrial fib and atrial flutter with variable conduction. - Personally Reviewed  ECG    NA - Personally Reviewed  Physical Exam   GEN: No acute distress.   Neck: No  JVD Cardiac: Irregular RR, no murmurs, rubs, or gallops.  Respiratory: Clear  to auscultation bilaterally. GI: Soft, nontender, non-distended  MS: No  edema; No deformity. Neuro:  Nonfocal  Psych: Normal affect   Labs    Chemistry Recent Labs  Lab 05/06/24 1145 05/09/24 0420 05/10/24 0257 05/10/24 0737 05/11/24 0233  NA 138 134*  --  132* 134*  K 3.9 3.8 3.8 4.0 4.2  CL 99 102  --  103 102  CO2 28 21*  --  24 24  GLUCOSE 110* 207*  --  168* 145*  BUN 18 16  --  27* 27*  CREATININE 0.86 1.06* 1.10* 0.91 0.70  CALCIUM  10.0 8.4*  --  8.3* 8.6*  PROT 8.1  --   --   --   --   ALBUMIN  4.3  --   --   --   --   AST 26  --   --   --   --   ALT 21  --   --   --   --  ALKPHOS 91  --   --   --   --   BILITOT 0.5  --   --   --   --   GFRNONAA >60 52* 50* >60 >60  ANIONGAP 11 11  --  5 8     Hematology Recent Labs  Lab 05/09/24 1817 05/10/24 0257 05/11/24 0233  WBC 19.7* 18.4* 13.0*  RBC 3.62* 3.37* 3.39*  HGB 11.4* 10.5* 10.5*  HCT 35.5* 32.4* 32.8*  MCV 98.1 96.1 96.8  MCH 31.5 31.2 31.0  MCHC 32.1 32.4 32.0  RDW 13.0 13.2 13.5  PLT 315 294 323    Cardiac EnzymesNo results for input(s): "TROPONINI" in the last 168 hours. No results for input(s): "TROPIPOC" in the last 168 hours.   BNPNo results for input(s): "BNP", "PROBNP" in the last 168 hours.   DDimer No results for input(s): "DDIMER" in the last 168 hours.   Radiology    ECHOCARDIOGRAM COMPLETE Result Date: 05/10/2024    ECHOCARDIOGRAM REPORT   Patient Name:   Elizabeth Odom Date of Exam: 05/10/2024 Medical Rec #:  [id number]      Height:       65.0 in Accession #:    [id number]     Weight:       132.9 lb Date of Birth:  03-12-1941     BSA:          1.663 m Patient Age:    83 years        BP:           158/72 mmHg Patient Gender: F              HR:           95 bpm. Exam Location:  Inpatient Procedure: 2D Echo, Cardiac Doppler and Color Doppler (Both Spectral and Color            Flow Doppler were utilized during procedure). Indications:    I48.91* Unspeicified atrial fibrillation  History:        Patient has prior history of Echocardiogram examinations, most                 recent 10/01/2023. Abnormal ECG, Arrythmias:Atrial Fibrillation,                 Signs/Symptoms:Altered Mental Status; Risk Factors:Hypertension                 and Dyslipidemia. Polmonary embolus. IVC filter.  Sonographer:    Raynelle Callow RDCS Referring Phys: [phone] ANAND D HONGALGI IMPRESSIONS  1. Left ventricular ejection fraction, by estimation, is 60 to 65%. The left ventricle has normal function. The left ventricle has no regional wall motion abnormalities. Left ventricular diastolic parameters are indeterminate.  2. Right ventricular systolic function is normal. The right ventricular size is normal. There is normal pulmonary artery systolic pressure.  3. The mitral valve was not well visualized. Mild mitral valve regurgitation. No evidence of mitral stenosis.  4. The tricuspid valve is abnormal. Tricuspid valve regurgitation is mild to moderate.  5. The aortic valve is tricuspid. Aortic valve regurgitation is not visualized. No aortic stenosis is present.  6. The inferior vena cava is normal in size with greater than 50% respiratory variability, suggesting right atrial pressure of 3 mmHg. FINDINGS  Left Ventricle: Left ventricular ejection fraction, by estimation, is 60 to 65%. The left ventricle has normal function. The left ventricle has no regional wall motion abnormalities. The left ventricular internal cavity size was  normal in size. There is  no left ventricular hypertrophy. Left ventricular diastolic parameters are indeterminate. Right Ventricle: The right ventricular size is normal. No increase in right ventricular  wall thickness. Right ventricular systolic function is normal. There is normal pulmonary artery systolic pressure. The tricuspid regurgitant velocity is 2.58 m/s, and  with an assumed right atrial pressure of 8 mmHg, the estimated right ventricular systolic pressure is 34.6 mmHg. Left Atrium: Left atrial size was normal in size. Right Atrium: Right atrial size was normal in size. Pericardium: There is no evidence of pericardial effusion. Mitral Valve: The mitral valve was not well visualized. Mild mitral valve regurgitation. No evidence of mitral valve stenosis. Tricuspid Valve: The tricuspid valve is abnormal. Tricuspid valve regurgitation is mild to moderate. No evidence of tricuspid stenosis. Aortic Valve: The aortic valve is tricuspid. Aortic valve regurgitation is not visualized. No aortic stenosis is present. Aortic valve mean gradient measures 5.1 mmHg. Aortic valve peak gradient measures 11.4 mmHg. Aortic valve area, by VTI measures 2.37  cm. Pulmonic Valve: The pulmonic valve was normal in structure. Pulmonic valve regurgitation is not visualized. No evidence of pulmonic stenosis. Aorta: The aortic root and ascending aorta are structurally normal, with no evidence of dilitation. Venous: The inferior vena cava is normal in size with greater than 50% respiratory variability, suggesting right atrial pressure of 3 mmHg. IAS/Shunts: No atrial level shunt detected by color flow Doppler.  LEFT VENTRICLE PLAX 2D LVIDd:         3.60 cm LVIDs:         2.40 cm LV PW:         1.00 cm LV IVS:        1.00 cm LVOT diam:     2.30 cm LV SV:         57 LV SV Index:   34 LVOT Area:     4.15 cm  LV Volumes (MOD) LV vol d, MOD A2C: 61.0 ml LV vol d, MOD A4C: 49.1 ml LV vol s, MOD A2C: 27.6 ml LV vol s, MOD A4C: 21.0 ml LV SV MOD A2C:     33.4 ml LV SV MOD A4C:     49.1 ml LV SV MOD BP:      33.9 ml RIGHT VENTRICLE             IVC RV S prime:     18.00 cm/s  IVC diam: 2.10 cm TAPSE (M-mode): 1.3 cm LEFT ATRIUM           Index         RIGHT ATRIUM           Index LA diam:      2.00 cm 1.20 cm/m   RA Area:     14.60 cm LA Vol (A2C): 9.6 ml  5.79 ml/m   RA Volume:   30.80 ml  18.52 ml/m LA Vol (A4C): 17.6 ml 10.58 ml/m  AORTIC VALVE AV Area (Vmax):    2.58 cm AV Area (Vmean):   2.82 cm AV Area (VTI):     2.37 cm AV Vmax:           168.92 cm/s AV Vmean:          103.287 cm/s AV VTI:            0.240 m AV Peak Grad:      11.4 mmHg AV Mean Grad:      5.1 mmHg LVOT Vmax:  105.00 cm/s LVOT Vmean:        70.100 cm/s LVOT VTI:          0.137 m LVOT/AV VTI ratio: 0.57  AORTA Ao Root diam: 3.20 cm Ao Asc diam:  3.50 cm MITRAL VALVE               TRICUSPID VALVE MV Area (PHT): 3.65 cm    TR Peak grad:   26.6 mmHg MV Decel Time: 208 msec    TR Vmax:        258.00 cm/s MV E velocity: 85.20 cm/s                            SHUNTS                            Systemic VTI:  0.14 m                            Systemic Diam: 2.30 cm Armida Lander MD Electronically signed by Armida Lander MD Signature Date/Time: 05/10/2024/6:42:49 PM    Final    DG Chest Port 1 View Result Date: 05/09/2024 CLINICAL DATA:  AFib postop colon surgery EXAM: PORTABLE CHEST 1 VIEW COMPARISON:  09/20/2023 FINDINGS: No acute airspace disease or effusion. Cardiomediastinal silhouette within normal limits. Aortic atherosclerosis. Chest wall emphysema bilaterally. Small volume free air beneath the right diaphragm. IVC filter to the right of L3. IMPRESSION: 1. No active disease. 2. Small volume free air beneath the right diaphragm and bilateral chest wall emphysema, likely postsurgical. It Electronically Signed   By: Esmeralda Hedge M.D.   On: 05/09/2024 19:41    Cardiac Studies   Echo results as above.   Patient Profile     83 y.o. female post rectosigmoid resection takedown.  Post up fib and flutter.    Assessment & Plan    Atrial fib and flutter:  Rate is relatively controlled while in NSR.  She is not able to talk POs yet.  Had to have frequent IV  metoprolol .  Also given Cardizem  IV once.  Continue with scheduled 5 mg Q4 hours.    Will discuss starting heparin  with the primary team.    For questions or updates, please contact [facility] Please consult [email or web address] for contact info under Cardiology/STEMI.   Signed, Eilleen Grates, MD  05/11/2024, 7:53 AM

## 2024-05-11 NOTE — Progress Notes (Signed)
 NT called RN into the room to help assess the patient.  NT got patient up from the chair to place on [facility].  Once done on the [facility], NT placed patient on the bed to clean her up.  Once on the bed, the patient started bleeding from the rectum and become unresponsive.  NT called for help from an RN.  RN came in to assess the patient.  Patient started talking, knew name, where she was at, that she felt lousy.  BP was 126/93 (101), pulse 72, EKG rate 108, O2 sat 92% on initial assessment.    Bed pad in chair had bright red blood and small clots on it.  When the patient turned on her side, bright red blood was expelling out of her rectum, pulsating out.  RN placed pressure on her rectum until bleeding stopped.  Approximately 7 minutes.  RN continued to talk to patient to make sure she stayed responsive.  Small clots were on the bed pad and in the wash cloths that were used for pressure.    The patient's vital signs 15 minutes later were 114/49 (68), pulse 76, EKG rate 103, and O2 sats 94%.  The assigned RN notified the MD for surgery.

## 2024-05-12 ENCOUNTER — Other Ambulatory Visit: Payer: Self-pay

## 2024-05-12 ENCOUNTER — Inpatient Hospital Stay (HOSPITAL_COMMUNITY)

## 2024-05-12 ENCOUNTER — Ambulatory Visit: Payer: Self-pay | Admitting: Surgery

## 2024-05-12 DIAGNOSIS — R55 Syncope and collapse: Secondary | ICD-10-CM

## 2024-05-12 DIAGNOSIS — K567 Ileus, unspecified: Secondary | ICD-10-CM

## 2024-05-12 DIAGNOSIS — N179 Acute kidney failure, unspecified: Secondary | ICD-10-CM

## 2024-05-12 DIAGNOSIS — E43 Unspecified severe protein-calorie malnutrition: Secondary | ICD-10-CM | POA: Insufficient documentation

## 2024-05-12 DIAGNOSIS — I959 Hypotension, unspecified: Secondary | ICD-10-CM | POA: Diagnosis not present

## 2024-05-12 DIAGNOSIS — K9189 Other postprocedural complications and disorders of digestive system: Secondary | ICD-10-CM | POA: Diagnosis not present

## 2024-05-12 DIAGNOSIS — K5790 Diverticulosis of intestine, part unspecified, without perforation or abscess without bleeding: Secondary | ICD-10-CM | POA: Diagnosis not present

## 2024-05-12 LAB — CBC
HCT: 33.5 % — ABNORMAL LOW (ref 36.0–46.0)
Hemoglobin: 11 g/dL — ABNORMAL LOW (ref 12.0–15.0)
MCH: 31.5 pg (ref 26.0–34.0)
MCHC: 32.8 g/dL (ref 30.0–36.0)
MCV: 96 fL (ref 80.0–100.0)
Platelets: 353 10*3/uL (ref 150–400)
RBC: 3.49 MIL/uL — ABNORMAL LOW (ref 3.87–5.11)
RDW: 13.7 % (ref 11.5–15.5)
WBC: 14.7 10*3/uL — ABNORMAL HIGH (ref 4.0–10.5)
nRBC: 0.1 % (ref 0.0–0.2)

## 2024-05-12 LAB — BASIC METABOLIC PANEL WITH GFR
Anion gap: 9 (ref 5–15)
BUN: 47 mg/dL — ABNORMAL HIGH (ref 8–23)
CO2: 20 mmol/L — ABNORMAL LOW (ref 22–32)
Calcium: 8.9 mg/dL (ref 8.9–10.3)
Chloride: 104 mmol/L (ref 98–111)
Creatinine, Ser: 1.07 mg/dL — ABNORMAL HIGH (ref 0.44–1.00)
GFR, Estimated: 52 mL/min — ABNORMAL LOW (ref >60)
Glucose, Bld: 124 mg/dL — ABNORMAL HIGH (ref 70–99)
Potassium: 4.3 mmol/L (ref 3.5–5.1)
Sodium: 133 mmol/L — ABNORMAL LOW (ref 135–145)

## 2024-05-12 LAB — MAGNESIUM: Magnesium: 2.2 mg/dL (ref 1.7–2.4)

## 2024-05-12 LAB — PHOSPHORUS: Phosphorus: 3.1 mg/dL (ref 2.5–4.6)

## 2024-05-12 LAB — GLUCOSE, CAPILLARY
Glucose-Capillary: 116 mg/dL — ABNORMAL HIGH (ref 70–99)
Glucose-Capillary: 108 mg/dL — ABNORMAL HIGH (ref 70–99)

## 2024-05-12 LAB — SURGICAL PATHOLOGY

## 2024-05-12 MED ORDER — INSULIN ASPART 100 UNIT/ML IJ SOLN
0.0000 [IU] | Freq: Four times a day (QID) | INTRAMUSCULAR | Status: DC
  Administered 2024-05-13: 2 [IU] via SUBCUTANEOUS
  Administered 2024-05-13 – 2024-05-14 (×4): 1 [IU] via SUBCUTANEOUS
  Administered 2024-05-14: 2 [IU] via SUBCUTANEOUS

## 2024-05-12 MED ORDER — TRAVASOL 10 % IV SOLN
INTRAVENOUS | Status: DC
  Filled 2024-05-12: qty 840

## 2024-05-12 MED ORDER — PANTOPRAZOLE SODIUM 40 MG IV SOLR
40.0000 mg | Freq: Every day | INTRAVENOUS | Status: DC
  Administered 2024-05-12: 40 mg via INTRAVENOUS
  Filled 2024-05-12: qty 10

## 2024-05-12 MED ORDER — SODIUM CHLORIDE 0.9% FLUSH
10.0000 mL | INTRAVENOUS | Status: DC | PRN
  Administered 2024-05-14 – 2024-05-26 (×2): 10 mL

## 2024-05-12 MED ORDER — TRAVASOL 10 % IV SOLN
INTRAVENOUS | Status: AC
  Filled 2024-05-12: qty 360

## 2024-05-12 MED ORDER — THIAMINE HCL 100 MG/ML IJ SOLN
100.0000 mg | INTRAMUSCULAR | Status: DC
  Administered 2024-05-12 – 2024-05-13 (×2): 100 mg via INTRAVENOUS
  Filled 2024-05-12 (×2): qty 2

## 2024-05-12 MED ORDER — SIMETHICONE 40 MG/0.6ML PO SUSP
80.0000 mg | Freq: Four times a day (QID) | ORAL | Status: DC | PRN
  Administered 2024-05-15: 80 mg via ORAL
  Filled 2024-05-12: qty 1.2

## 2024-05-12 MED ORDER — SODIUM CHLORIDE 0.9% FLUSH
10.0000 mL | Freq: Two times a day (BID) | INTRAVENOUS | Status: DC
  Administered 2024-05-12 – 2024-06-02 (×29): 10 mL
  Administered 2024-06-02: 40 mL
  Administered 2024-06-03 – 2024-06-22 (×21): 10 mL
  Administered 2024-06-23: 30 mL
  Administered 2024-06-24 – 2024-06-25 (×3): 10 mL

## 2024-05-12 MED ORDER — FENTANYL CITRATE PF 50 MCG/ML IJ SOSY
12.5000 ug | PREFILLED_SYRINGE | INTRAMUSCULAR | Status: DC | PRN
  Administered 2024-05-12: 25 ug via INTRAVENOUS
  Administered 2024-05-12: 12.5 ug via INTRAVENOUS
  Administered 2024-05-13 – 2024-05-14 (×9): 25 ug via INTRAVENOUS
  Filled 2024-05-12 (×11): qty 1

## 2024-05-12 MED ORDER — LACTATED RINGERS IV BOLUS: 1000.0000 mL | Freq: Three times a day (TID) | INTRAVENOUS | Status: AC | PRN

## 2024-05-12 NOTE — TOC Progression Note (Signed)
 Transition of Care [facility]) - Progression Note    Patient Details  Name: Elizabeth Odom MRN: [id number] Date of Birth: March 11, 1941  Transition of Care [facility] [address]) CM/SW Contact  Jonni Nettle, LCSW Phone Number: 05/12/2024, 2:23 PM  Clinical Narrative:    CSW spoke with pt's granddaughter, Elizabeth Odom [phone], via phone call to discuss discharge planning. CSW advised that PT recommended home health PT/OT services upon discharge. Granddaughter and pt agree with discharge plan. Granddaughter reports she wants to further discuss [facility] agency preference with pt. CSW will follow up with granddaughter tomorrow for [facility] agency preference. No DME recommendations from PT. TOC will continue to follow.   Expected Discharge Plan: Home/Self Care Barriers to Discharge: Continued Medical Work up  Expected Discharge Plan and Services Home with [facility] services   Discharge Planning Services: CM Consult   Living arrangements for the past 2 months: Apartment   Social Determinants of Health (SDOH) Interventions SDOH Screenings   Food Insecurity: No Food Insecurity (05/09/2024)  Housing: Low Risk  (05/09/2024)  Transportation Needs: No Transportation Needs (05/09/2024)  Utilities: Not At Risk (05/09/2024)  Depression (PHQ2-9): Low Risk  (01/02/2024)  Social Connections: Unknown (05/09/2024)  Tobacco Use: Low Risk  (05/08/2024)    Readmission Risk Interventions    05/12/2024    2:23 PM 05/09/2024    2:46 PM 10/19/2023    6:13 PM  Readmission Risk Prevention Plan  Transportation Screening Complete Complete Complete  PCP or Specialist Appt within 5-7 Days   Complete  PCP or Specialist Appt within 3-5 Days Complete Complete   Home Care Screening   Complete  Medication Review (RN CM)   Complete  HRI or Home Care Consult Complete Complete   Social Work Consult for Recovery Care Planning/Counseling Complete Complete   Palliative Care Screening Not Applicable Not Applicable   Medication Review (RN Care Manager)  Complete Complete     [place], MSW, LCSW 05/12/2024 2:27 PM

## 2024-05-12 NOTE — Progress Notes (Signed)
 Initial Nutrition Assessment  DOCUMENTATION CODES:   Severe malnutrition in context of chronic illness  INTERVENTION:  - TPN to start tonight @ 58mL/hr.   - TPN management per pharmacy.   - Monitor magnesium , potassium, and phosphorus BID for at least 3 days, MD to replete as needed, as pt is at risk for refeeding syndrome given severe malnutrition. - 100mg  thiamine  x5 days.   - Daily weights while on TPN.    NUTRITION DIAGNOSIS:   Severe Malnutrition related to chronic illness as evidenced by severe fat depletion, severe muscle depletion.  GOAL:   Patient will meet greater than or equal to 90% of their needs  MONITOR:   Diet advancement, Labs, Weight trends  REASON FOR ASSESSMENT:   Consult New TPN/TNA  ASSESSMENT:   83 year old female with PMH HTN, mild cognitive impairment, asd large bowel obstruction (October 2024) s/p Hartman's resection and colostomy creation who was admitted 5/29 for colostomy reversal.   5/29 Admit; s/p colostomy reversal, small bowel resection, and extensive lysis of adhesions; CLD 5/30 Soft diet -> FLD 6/2 NPO; NGT placed; TPN to be initiated  Patient reports a UBW of 155# and possible weight loss since her lengthy admission in October for bowel obstruction.  Per EMR, patient has not been weighed at more than 142# since weight history began in 2017. Her weight appears mostly stable/with no significant changes since November 2024.  Patient endorses typically eating a small breakfast, small lunch, and a larger dinner at home. Drinks Boost Plus occasionally, but not every day.   She reports minimal intake since admission. Had 1-3 Ensures when on a diet but NPO since this AM with plan to start TPN tonight.  Discussed TPN with patient and answered all questions.   Discussed patient with pharmacist. Plan to start TPN tonight @ 50mL/hr which will provide 36g protein and 737 kcals. Goal per pharmacy is 31mL/hr. Patient is at risk for refeeding  syndrome, discussed with pharmacy about thiamine  supplementation.   Medications reviewed and include: -  Labs reviewed:  Na 133 Creatinine 1.07 HA1C 5.1  NUTRITION - FOCUSED PHYSICAL EXAM:  Flowsheet Row Most Recent Value  Orbital Region Severe depletion  Upper Arm Region Mild depletion  Thoracic and Lumbar Region Unable to assess  Buccal Region Severe depletion  Temple Region Severe depletion  Clavicle Bone Region Severe depletion  Clavicle and Acromion Bone Region Severe depletion  Scapular Bone Region Unable to assess  Dorsal Hand Moderate depletion  Patellar Region No depletion  Anterior Thigh Region No depletion  Posterior Calf Region No depletion  Edema (RD Assessment) None  Hair Reviewed  Eyes Reviewed  Mouth Reviewed  Skin Reviewed  Nails Reviewed       Diet Order:   Diet Order             Diet NPO time specified Except for: Ice Chips  Diet effective now           Diet - low sodium heart healthy                   EDUCATION NEEDS:  Education needs have been addressed  Skin:  Skin Assessment: Skin Integrity Issues: Skin Integrity Issues:: Stage II, Incisions Stage II: Right Buttocks Incisions: Abdomen  Last BM:  6/1 - type 6  Height:  Ht Readings from Last 1 Encounters:  05/09/24 5\' 5"  (1.651 m)   Weight:  Wt Readings from Last 1 Encounters:  05/11/24 59.6 kg    BMI:  Body mass index is 21.87 kg/m.  Estimated Nutritional Needs:  Kcal:  1600-1750 kcals Protein:  80-95 grams Fluid:  >/= 1.6L    Scheryl Cushing RD, LDN Contact via Secure Chat.

## 2024-05-12 NOTE — Progress Notes (Addendum)
 05/12/2024  Elizabeth Odom [id number] May 29, 1941  CARE TEAM: PCP: Anthon Kins, MD  Outpatient Care Team: Patient Care Team: Anthon Kins, MD as PCP - General (Internal Medicine) Cody Das, MD as PCP - Cardiology (Cardiology) Lajuan Pila, MD as Consulting Physician (Gastroenterology) Shela Derby, MD as Consulting Physician (General Surgery) Glory Larsen, MD (Neurology) Lahoma Pigg, MD as Consulting Physician (Urology) Sandee Crook Margart Shears, MD as Consulting Physician (Cardiology) Candyce Champagne, MD as Consulting Physician (Colon and Rectal Surgery)  Inpatient Treatment Team: Treatment Team:  Candyce Champagne, MD Ruel Cotta, Heinz Llano, MD Eliot Guernsey, MD [facility], [place], MD Gladystine Lamprey, RN Ardia Becket, [place] Silvia Drummer, RN Layvonne Primas, [place]   Problem List:   Principal Problem:   Diverticular disease   05/08/2024  POST-OPERATIVE DIAGNOSIS:   COLOSTOMY FOR RESECTION, DESIRE FOR OSTOMY TAKEDOWN RECTAL STRICTURE HISTORY OF DIVERTICULITIS WITH PERFORATION & ABSCESS   PROCEDURE:   -ROBOTIC RECTOSIGMOID RESECTION (LAR) -TAKEDOWN OF END COLOSTOMY WITH ANASTOMOSIS -RESECTION OF SMALL INTESTINE WITH ANASTOMOSIS -SMALL BOWEL REPAIR -LYSIS OF ADHESIONS x 115 MINUTES (66% OF CASE),  -INTRAOPERATIVE ASSESSMENT OF TISSUE VASCULAR PERFUSION USING ICG (indocyanine green ) IMMUNOFLUORESCENCE,  -TRANSVERSUS ABDOMINIS PLANE (TAP) BLOCK - BILATERAL -FLEXIBLE SIGMOIDOSCOPY   SURGEON:  Eddye Goodie, MD  OR FINDINGS:  Very dense adhesions of small bowel especially to pelvis and retroperitoneum.  Extremely concrete dense adhesions with friable tissue near her old prior small bowel anastomosis and distal jejunum.  Required small bowel repair and required small bowel resection including old anastomosis given severe tissues.  Patient had significant fibrotic stricturing of rectal stump at the mid/distal rectal junction.  Could not be released up.   Ended up doing resection of rectal stump to mid/distal rectal junction.  No obvious metastatic disease on visceral parietal peritoneum or liver.   It is a 29mm EEA anastomosis ( distal descending colon  connected to mid/distal rectal junction.)  It rests 7 cm from the anal verge by rigid proctoscopy.  Assessment [facility] Stay = 4 days) 4 Days Post-Op    Struggling    Plan:  With worsening distention and vomiting despite promotility efforts, Place NG tube.  With poor p.o. effort start TPN through PICC line.  Atrial fibrillation postop.  History of a prior event.   Cardiology initially did not feel as needed but now is leading towards trying to do full anticoagulation.  She does have some hematochezia but hemoglobin is stable.  Nothing too severe.    Reasonable to start with IV heparin  gtt with low threshold to hold if has worsening bleeding or following hemoglobin   Rate control through IV means.  Looks like scheduled metoprolol  somewhat helping   I think she is getting oversedated with the Dilaudid  and oxycodone .  Switch to fentanyl  and see if that helps control pain with about being as sedating   Rehydration as tolerated.  -monitor electrolytes & replace as needed  Keep K>4, Mg>2, Phos>3  -VTE prophylaxis- SCDs.  Anticoagulation as appropriate  -mobilize as tolerated to help recovery.  Enlist therapies in moderate/high risk patients as appropriate  I updated the patient's status to the patient and nurse.  Discussed with [facility] internal medicine.  Asked Dr. Sherrod Dolphin to follow at least 1 or 2 more days until I get a send she is stable and can come out of stepdown.  Recommendations were made.  Questions were answered.  She expressed understanding & appreciation.  -Disposition: TBD      I reviewed  last 24 h vitals and pain scores, last 48 h intake and output, last 24 h labs and trends, and last 24 h imaging results.  I have reviewed this patient's available data, including  medical history, events of note, test results, etc as part of my evaluation.   A significant portion of that time was spent in counseling. Care during the described time interval was provided by me.  This care required moderate level of medical decision making.  05/12/2024    Subjective: (Chief complaint)  Events noted over weekend.  Patient tried to sip liquids but spit up several times.  No large emesis.  Patient had episode hematochezia yesterday afternoon.  1 small spot last night but no major bleeding.  Blood pressure stable.  Wants Dilaudid  but gets very sedated with it.  Nursing just outside room.  Objective:  Vital signs:  Vitals:   05/12/24 0300 05/12/24 0400 05/12/24 0500 05/12/24 0600  BP: (!) 110/48 129/72 (!) 124/53 (!) 112/53  Pulse: (!) 113 (!) 110 (!) 116 (!) 119  Resp: 17 19 17 16   Temp:  99.9 F (37.7 C)    TempSrc:  Axillary    SpO2: 96% 97% 95% 96%  Weight:      Height:        Last BM Date : 05/09/24  Intake/Output   Yesterday:  06/01 0701 - 06/02 0700 In: 619.1 [P.O.:240; I.V.:4.1; IV Piggyback:375] Out: 165 [Urine:150; Drains:15] This shift:  No intake/output data recorded.  Bowel function:  Flatus: No  BM:  No  Drain: Serosanguinous volume.   Physical Exam:  General: Pt sleeping to awaken and mild discomfort but no major distress  Eyes: PERRL, normal EOM.  Sclera clear.  No icterus Neuro: CN II-XII intact w/o focal sensory/motor deficits. Lymph: No head/neck/groin lymphadenopathy Psych:  No delerium/psychosis/paranoia.  Oriented x 4 HENT: Normocephalic, Mucus membranes moist.  No thrush Neck: Supple, No tracheal deviation.  No obvious thyromegaly Chest: No pain to chest wall compression.  Good respiratory excursion.  No audible wheezing CV:  Pulses intact.  Regular rhythm.  No major extremity edema MS: Normal AROM mjr joints.  No obvious deformity  Abdomen: Mostly firm.  Very distended.  Nontender.  Old ostomy incision with  normal healing ridge.  No evidence of peritonitis.  No incarcerated hernias.  Ext:   No deformity.  No mjr edema.  No cyanosis Skin: No petechiae / purpurea.  No major sores.  Warm and dry    Results:   Cultures: Recent Results (from the past 720 hours)  Urine Culture     Status: Abnormal   Collection Time: 05/09/24  8:30 PM   Specimen: Urine, Clean Catch  Result Value Ref Range Status   Specimen Description   Final    URINE, CLEAN CATCH Performed at [facility], [address] [place], [place] [address]    Special Requests   Final    NONE Performed at [facility], [address], [place], [place] [address]    Culture (A)  Final    <10,000 COLONIES/mL INSIGNIFICANT GROWTH Performed at [facility], [address] [place], [place] [address]    Report Status 05/11/2024 FINAL  Final    Labs: Results for orders placed or performed during the hospital encounter of 05/08/24 (from the past 48 hours)  Basic metabolic panel     Status: Abnormal   Collection Time: 05/10/24  7:37 AM  Result Value Ref Range   Sodium 132 (  L) 135 - 145 mmol/L   Potassium 4.0 3.5 - 5.1 mmol/L   Chloride 103 98 - 111 mmol/L   CO2 24 22 - 32 mmol/L   Glucose, Bld 168 (H) 70 - 99 mg/dL    Comment: Glucose reference range applies only to samples taken after fasting for at least 8 hours.   BUN 27 (H) 8 - 23 mg/dL   Creatinine, Ser 4.69 0.44 - 1.00 mg/dL   Calcium  8.3 (L) 8.9 - 10.3 mg/dL   GFR, Estimated >62 >95 mL/min    Comment: (NOTE) Calculated using the CKD-EPI Creatinine Equation (2021)    Anion gap 5 5 - 15    Comment: Performed at [facility], [address] [place], [place] [address]  TSH     Status: None   Collection Time: 05/10/24  7:37 AM  Result Value Ref Range   TSH 2.327 0.350 - 4.500 uIU/mL    Comment: Performed by a 3rd Generation assay with a functional sensitivity of <=0.01 uIU/mL. Performed at [facility], [address], [place], [place] [address]   Magnesium      Status: None   Collection Time: 05/10/24  7:37 AM  Result Value Ref Range   Magnesium  2.1 1.7 - 2.4 mg/dL    Comment: Performed at [facility], [address], [place], [place] [address]  CBC     Status: Abnormal   Collection Time: 05/11/24  2:33 AM  Result Value Ref Range   WBC 13.0 (H) 4.0 - 10.5 K/uL   RBC 3.39 (L) 3.87 - 5.11 MIL/uL   Hemoglobin 10.5 (L) 12.0 - 15.0 g/dL   HCT 36.6 (L) 44.0 - 34.7 %   MCV 96.8 80.0 - 100.0 fL   MCH 31.0 26.0 - 34.0 pg   MCHC 32.0 30.0 - 36.0 g/dL   RDW 42.5 95.6 - 38.7 %   Platelets 323 150 - 400 K/uL   nRBC 0.0 0.0 - 0.2 %    Comment: Performed at [facility], [address] [place], [place] [address]  Basic metabolic panel     Status: Abnormal   Collection Time: 05/11/24  2:33 AM  Result Value Ref Range   Sodium 134 (L) 135 - 145 mmol/L   Potassium 4.2 3.5 - 5.1 mmol/L   Chloride 102 98 - 111 mmol/L   CO2 24 22 - 32 mmol/L   Glucose, Bld 145 (H) 70 - 99 mg/dL    Comment: Glucose reference range applies only to samples taken after fasting for at least 8 hours.   BUN 27 (H) 8 - 23 mg/dL   Creatinine, Ser 2.95 0.44 - 1.00 mg/dL   Calcium  8.6 (L) 8.9 - 10.3 mg/dL   GFR, Estimated >18 >84 mL/min    Comment: (NOTE) Calculated using the CKD-EPI Creatinine Equation (2021)    Anion gap 8 5 - 15    Comment: Performed at [facility], [address], [place], [place] [address]  CBC     Status: Abnormal   Collection Time: 05/11/24  4:44 PM  Result Value Ref Range   WBC 11.0 (H) 4.0 - 10.5 K/uL   RBC 3.57 (L) 3.87 - 5.11 MIL/uL   Hemoglobin 11.1 (L) 12.0 - 15.0 g/dL   HCT 30.1 (L) 60.1 - 09.3 %   MCV 95.0 80.0 - 100.0 fL   MCH 31.1 26.0 - 34.0 pg   MCHC 32.7 30.0 - 36.0 g/dL   RDW  13.6 11.5 - 15.5 %   Platelets 368 150 - 400 K/uL   nRBC 0.0 0.0 - 0.2 %    Comment: Performed at [facility], [address], [place], [place] [address]  CBC     Status: Abnormal   Collection Time: 05/12/24  2:47 AM  Result Value Ref Range   WBC 14.7 (H) 4.0 - 10.5 K/uL   RBC 3.49 (L) 3.87 - 5.11 MIL/uL   Hemoglobin 11.0 (L) 12.0 - 15.0 g/dL   HCT 60.4 (L) 54.0 - 98.1 %   MCV 96.0 80.0 - 100.0 fL   MCH 31.5 26.0 - 34.0 pg   MCHC 32.8 30.0 - 36.0 g/dL   RDW 19.1 47.8 - 29.5 %   Platelets 353 150 - 400 K/uL   nRBC 0.1 0.0 - 0.2 %    Comment: Performed at [facility], [address] [place], [place] [address]  Basic metabolic panel     Status: Abnormal   Collection Time: 05/12/24  2:47 AM  Result Value Ref Range   Sodium 133 (L) 135 - 145 mmol/L   Potassium 4.3 3.5 - 5.1 mmol/L   Chloride 104 98 - 111 mmol/L   CO2 20 (L) 22 - 32 mmol/L   Glucose, Bld 124 (H) 70 - 99 mg/dL    Comment: Glucose reference range applies only to samples taken after fasting for at least 8 hours.   BUN 47 (H) 8 - 23 mg/dL   Creatinine, Ser 8.65 (H) 0.44 - 1.00 mg/dL   Calcium  8.9 8.9 - 10.3 mg/dL   GFR, Estimated 52 (L) >60 mL/min    Comment: (NOTE) Calculated using the CKD-EPI Creatinine Equation (2021)    Anion gap 9 5 - 15    Comment: Performed at [facility], [address], [place], [place] [address]  Magnesium      Status: None   Collection Time: 05/12/24  2:47 AM  Result Value Ref Range   Magnesium  2.2 1.7 - 2.4 mg/dL    Comment: Performed at [facility], [address], [place], [place] [address]    Imaging / Studies: DG CHEST PORT 1 VIEW Result Date: 05/11/2024 EXAM: 1 VIEW XRAY OF THE CHEST 05/11/2024 06:11:00 PM COMPARISON: 05/09/2024 CLINICAL HISTORY: Dyspnea, had bleeding from rectum earlier today and became unresponsive. FINDINGS: LUNGS AND PLEURA: No focal pulmonary opacity. No pulmonary edema. No pleural effusion. No pneumothorax. HEART AND MEDIASTINUM: No acute abnormality of the cardiac and mediastinal silhouettes. BONES  AND SOFT TISSUES: No acute osseous abnormality. IMPRESSION: 1. No acute process. Electronically signed by: Zadie Herter MD 05/11/2024 07:06 PM EDT RP Workstation: [id number]   ECHOCARDIOGRAM COMPLETE Result Date: 05/10/2024    ECHOCARDIOGRAM REPORT   Patient Name:   ASLEY BASKERVILLE Date of Exam: 05/10/2024 Medical Rec #:  [id number]      Height:       65.0 in Accession #:    [id number]     Weight:       132.9 lb Date of Birth:  08-01-1941     BSA:          1.663 m Patient Age:    82 years       BP:           158/72 mmHg Patient Gender: F              HR:           95 bpm. Exam Location:  Inpatient Procedure: 2D Echo, Cardiac Doppler and Color Doppler (Both Spectral and Color            Flow Doppler were utilized during procedure). Indications:    I48.91* Unspeicified atrial fibrillation  History:        Patient has prior history of Echocardiogram examinations, most                 recent 10/01/2023. Abnormal ECG, Arrythmias:Atrial Fibrillation,                 Signs/Symptoms:Altered Mental Status; Risk Factors:Hypertension                 and Dyslipidemia. Polmonary embolus. IVC filter.  Sonographer:    Raynelle Callow RDCS Referring Phys: [phone] ANAND D HONGALGI IMPRESSIONS  1. Left ventricular ejection fraction, by estimation, is 60 to 65%. The left ventricle has normal function. The left ventricle has no regional wall motion abnormalities. Left ventricular diastolic parameters are indeterminate.  2. Right ventricular systolic function is normal. The right ventricular size is normal. There is normal pulmonary artery systolic pressure.  3. The mitral valve was not well visualized. Mild mitral valve regurgitation. No evidence of mitral stenosis.  4. The tricuspid valve is abnormal. Tricuspid valve regurgitation is mild to moderate.  5. The aortic valve is tricuspid. Aortic valve regurgitation is not visualized. No aortic stenosis is present.  6. The inferior vena cava is normal in size with greater than 50% respiratory  variability, suggesting right atrial pressure of 3 mmHg. FINDINGS  Left Ventricle: Left ventricular ejection fraction, by estimation, is 60 to 65%. The left ventricle has normal function. The left ventricle has no regional wall motion abnormalities. The left ventricular internal cavity size was normal in size. There is  no left ventricular hypertrophy. Left ventricular diastolic parameters are indeterminate. Right Ventricle: The right ventricular size is normal. No increase in right ventricular wall thickness. Right ventricular systolic function is normal. There is normal pulmonary artery systolic pressure. The tricuspid regurgitant velocity is 2.58 m/s, and  with an assumed right atrial pressure of 8 mmHg, the estimated right ventricular systolic pressure is 34.6 mmHg. Left Atrium: Left atrial size was normal in size. Right Atrium: Right atrial size was normal in size. Pericardium: There is no evidence of pericardial effusion. Mitral Valve: The mitral valve was not well visualized. Mild mitral valve regurgitation. No evidence of mitral valve stenosis. Tricuspid Valve: The tricuspid valve is abnormal. Tricuspid valve regurgitation is mild to moderate. No evidence of tricuspid stenosis. Aortic Valve: The aortic valve is tricuspid. Aortic valve regurgitation is not visualized. No aortic stenosis is present. Aortic valve mean gradient measures 5.1 mmHg. Aortic valve peak gradient measures 11.4 mmHg. Aortic valve area, by VTI measures 2.37  cm. Pulmonic Valve: The pulmonic valve was normal in structure. Pulmonic valve regurgitation is not visualized. No evidence of pulmonic stenosis. Aorta: The aortic root and ascending aorta are structurally normal, with no evidence of dilitation. Venous: The inferior vena cava is normal in size with greater than 50% respiratory variability, suggesting right atrial pressure of 3 mmHg. IAS/Shunts: No atrial level shunt detected by color flow Doppler.  LEFT VENTRICLE PLAX 2D LVIDd:          3.60 cm LVIDs:         2.40 cm LV PW:         1.00 cm LV IVS:        1.00 cm LVOT diam:     2.30  cm LV SV:         57 LV SV Index:   34 LVOT Area:     4.15 cm  LV Volumes (MOD) LV vol d, MOD A2C: 61.0 ml LV vol d, MOD A4C: 49.1 ml LV vol s, MOD A2C: 27.6 ml LV vol s, MOD A4C: 21.0 ml LV SV MOD A2C:     33.4 ml LV SV MOD A4C:     49.1 ml LV SV MOD BP:      33.9 ml RIGHT VENTRICLE             IVC RV S prime:     18.00 cm/s  IVC diam: 2.10 cm TAPSE (M-mode): 1.3 cm LEFT ATRIUM           Index        RIGHT ATRIUM           Index LA diam:      2.00 cm 1.20 cm/m   RA Area:     14.60 cm LA Vol (A2C): 9.6 ml  5.79 ml/m   RA Volume:   30.80 ml  18.52 ml/m LA Vol (A4C): 17.6 ml 10.58 ml/m  AORTIC VALVE AV Area (Vmax):    2.58 cm AV Area (Vmean):   2.82 cm AV Area (VTI):     2.37 cm AV Vmax:           168.92 cm/s AV Vmean:          103.287 cm/s AV VTI:            0.240 m AV Peak Grad:      11.4 mmHg AV Mean Grad:      5.1 mmHg LVOT Vmax:         105.00 cm/s LVOT Vmean:        70.100 cm/s LVOT VTI:          0.137 m LVOT/AV VTI ratio: 0.57  AORTA Ao Root diam: 3.20 cm Ao Asc diam:  3.50 cm MITRAL VALVE               TRICUSPID VALVE MV Area (PHT): 3.65 cm    TR Peak grad:   26.6 mmHg MV Decel Time: 208 msec    TR Vmax:        258.00 cm/s MV E velocity: 85.20 cm/s                            SHUNTS                            Systemic VTI:  0.14 m                            Systemic Diam: 2.30 cm Armida Lander MD Electronically signed by Armida Lander MD Signature Date/Time: 05/10/2024/6:42:49 PM    Final     Medications / Allergies: per chart  Antibiotics: Anti-infectives (From admission, onward)    Start     Dose/Rate Route Frequency Ordered Stop   05/09/24 0900  erythromycin  250 mg in sodium chloride  0.9 % 100 mL IVPB        250 mg 100 mL/hr over 60 Minutes Intravenous Every 8 hours 05/09/24 0732 05/11/24 0044   05/08/24 2200  cefoTEtan  (CEFOTAN ) 2 g in sodium chloride  0.9 % 100 mL IVPB        2  g 200 mL/hr over 30 Minutes Intravenous Every 12 hours 05/08/24 1759 05/09/24 0749   05/08/24 1400  neomycin  (MYCIFRADIN ) tablet 1,000 mg  Status:  Discontinued       Placed in "And" Linked Group   1,000 mg Oral 3 times per day 05/08/24 1119 05/08/24 1120   05/08/24 1400  metroNIDAZOLE  (FLAGYL ) tablet 1,000 mg  Status:  Discontinued       Placed in "And" Linked Group   1,000 mg Oral 3 times per day 05/08/24 1119 05/08/24 1120   05/08/24 1130  cefoTEtan  (CEFOTAN ) 2 g in sodium chloride  0.9 % 100 mL IVPB        2 g 200 mL/hr over 30 Minutes Intravenous On call to O.R. 05/08/24 1119 05/09/24 0749         Note: Portions of this report may have been transcribed using voice recognition software. Every effort was made to ensure accuracy; however, inadvertent computerized transcription errors may be present.   Any transcriptional errors that result from this process are unintentional.    Eddye Goodie, MD, FACS, MASCRS Esophageal, Gastrointestinal & Colorectal Surgery Robotic and Minimally Invasive Surgery  [facility] [address] [place], [place] [address] [phone] Fax [phone] Main  CONTACT INFORMATION: Weekday (9AM-5PM): Call [facility] main office at [phone] Weeknight (5PM-9AM) or Weekend/Holiday: Check EPIC "Web Links" tab & use "AMION" (password " [email or web address]") for General Surgery [facility] coverage  Please, DO NOT use SecureChat  (it is not reliable communication to reach operating surgeons & will lead to a delay in care).   Epic staff messaging available for outptient concerns needing 1-2 business day response.      05/12/2024  7:04 AM

## 2024-05-12 NOTE — Progress Notes (Signed)
 Progress Note  Patient Name: Elizabeth Odom Date of Encounter: 05/12/2024  Primary Cardiologist: Cody Das, MD   Subjective   Patient seen and examined.   Inpatient Medications    Scheduled Meds:  alvimopan   12 mg Oral BID   aspirin  EC  81 mg Oral Daily   Chlorhexidine  Gluconate Cloth  6 each Topical Daily   insulin  aspart  0-9 Units Subcutaneous Q6H   methocarbamol  (ROBAXIN ) injection  500 mg Intravenous Q8H   metoprolol  tartrate  5 mg Intravenous Q6H   pantoprazole (PROTONIX) IV  40 mg Intravenous QHS   sodium chloride  flush  3 mL Intravenous Q12H   Continuous Infusions:  sodium chloride      sodium chloride  100 mL/hr at 05/12/24 0942   lactated ringers      PRN Meds: sodium chloride , alum & mag hydroxide-simeth, diphenhydrAMINE  **OR** diphenhydrAMINE , fentaNYL  (SUBLIMAZE ) injection, hydrALAZINE , lactated ringers , magic mouthwash, menthol -cetylpyridinium, metoprolol  tartrate, naphazoline-glycerin , [DISCONTINUED] ondansetron  **OR** ondansetron  (ZOFRAN ) IV, mouth rinse, phenol, [DISCONTINUED] prochlorperazine  **OR** prochlorperazine , simethicone , sodium chloride , sodium chloride  flush   Vital Signs    Vitals:   05/12/24 0500 05/12/24 0600 05/12/24 0743 05/12/24 0800  BP: (!) 124/53 (!) 112/53 (!) 116/46 (!) 112/58  Pulse: (!) 116 (!) 119 (!) 104 (!) 112  Resp: 17 16 17 20   Temp:   97.7 F (36.5 C)   TempSrc:   Oral   SpO2: 95% 96% 96% 94%  Weight:      Height:        Intake/Output Summary (Last 24 hours) at 05/12/2024 0948 Last data filed at 05/11/2024 1831 Gross per 24 hour  Intake 619.08 ml  Output 165 ml  Net 454.08 ml   Filed Weights   05/09/24 1730 05/10/24 0500 05/11/24 0404  Weight: 61.3 kg 60.3 kg 59.6 kg    Telemetry    Atrial fibrillation but rate controlled  - Personally Reviewed  ECG     - Personally Reviewed  Physical Exam   General: Comfortable Head: Atraumatic, normal size  Eyes: PEERLA, EOMI  Neck: Supple, normal  JVD Cardiac: Normal S1, S2; RRR; no murmurs, rubs, or gallops Lungs: Clear to auscultation bilaterally Abd: Soft, nontender, no hepatomegaly  Ext: warm, no edema Musculoskeletal: No deformities, BUE and BLE strength normal and equal Skin: Warm and dry, no rashes   Neuro: Alert and oriented to person, place, time, and situation, CNII-XII grossly intact, no focal deficits  Psych: Normal mood and affect   Labs    Chemistry Recent Labs  Lab 05/06/24 1145 05/09/24 0420 05/10/24 0737 05/11/24 0233 05/12/24 0247  NA 138   < > 132* 134* 133*  K 3.9   < > 4.0 4.2 4.3  CL 99   < > 103 102 104  CO2 28   < > 24 24 20*  GLUCOSE 110*   < > 168* 145* 124*  BUN 18   < > 27* 27* 47*  CREATININE 0.86   < > 0.91 0.70 1.07*  CALCIUM  10.0   < > 8.3* 8.6* 8.9  PROT 8.1  --   --   --   --   ALBUMIN  4.3  --   --   --   --   AST 26  --   --   --   --   ALT 21  --   --   --   --   ALKPHOS 91  --   --   --   --   BILITOT 0.5  --   --   --   --  GFRNONAA >60   < > >60 >60 52*  ANIONGAP 11   < > 5 8 9    < > = values in this interval not displayed.     Hematology Recent Labs  Lab 05/11/24 0233 05/11/24 1644 05/12/24 0247  WBC 13.0* 11.0* 14.7*  RBC 3.39* 3.57* 3.49*  HGB 10.5* 11.1* 11.0*  HCT 32.8* 33.9* 33.5*  MCV 96.8 95.0 96.0  MCH 31.0 31.1 31.5  MCHC 32.0 32.7 32.8  RDW 13.5 13.6 13.7  PLT 323 368 353    Cardiac EnzymesNo results for input(s): "TROPONINI" in the last 168 hours. No results for input(s): "TROPIPOC" in the last 168 hours.   BNPNo results for input(s): "BNP", "PROBNP" in the last 168 hours.   DDimer No results for input(s): "DDIMER" in the last 168 hours.   Radiology    DG Abd 1 View Result Date: 05/12/2024 CLINICAL DATA:  NG tube placement EXAM: ABDOMEN - 1 VIEW COMPARISON:  October 26, 2023 FINDINGS: the tip of the nasogastric tube is located within the antrum stomach in good position. IVC filter in place IMPRESSION: Nasogastric tube in good position.  Electronically Signed   By: Fredrich Jefferson M.D.   On: 05/12/2024 08:53   US  EKG SITE RITE Result Date: 05/12/2024 If Site Rite image not attached, placement could not be confirmed due to current cardiac rhythm.  DG CHEST PORT 1 VIEW Result Date: 05/11/2024 EXAM: 1 VIEW XRAY OF THE CHEST 05/11/2024 06:11:00 PM COMPARISON: 05/09/2024 CLINICAL HISTORY: Dyspnea, had bleeding from rectum earlier today and became unresponsive. FINDINGS: LUNGS AND PLEURA: No focal pulmonary opacity. No pulmonary edema. No pleural effusion. No pneumothorax. HEART AND MEDIASTINUM: No acute abnormality of the cardiac and mediastinal silhouettes. BONES AND SOFT TISSUES: No acute osseous abnormality. IMPRESSION: 1. No acute process. Electronically signed by: Zadie Herter MD 05/11/2024 07:06 PM EDT RP Workstation: [id number]   ECHOCARDIOGRAM COMPLETE Result Date: 05/10/2024    ECHOCARDIOGRAM REPORT   Patient Name:   Elizabeth Odom Date of Exam: 05/10/2024 Medical Rec #:  [id number]      Height:       65.0 in Accession #:    [id number]     Weight:       132.9 lb Date of Birth:  02-Dec-1941     BSA:          1.663 m Patient Age:    82 years       BP:           158/72 mmHg Patient Gender: F              HR:           95 bpm. Exam Location:  Inpatient Procedure: 2D Echo, Cardiac Doppler and Color Doppler (Both Spectral and Color            Flow Doppler were utilized during procedure). Indications:    I48.91* Unspeicified atrial fibrillation  History:        Patient has prior history of Echocardiogram examinations, most                 recent 10/01/2023. Abnormal ECG, Arrythmias:Atrial Fibrillation,                 Signs/Symptoms:Altered Mental Status; Risk Factors:Hypertension                 and Dyslipidemia. Polmonary embolus. IVC filter.  Sonographer:    Raynelle Callow RDCS Referring Phys: [phone] ANAND D HONGALGI IMPRESSIONS  1. Left ventricular ejection fraction, by estimation, is 60 to 65%. The left ventricle has normal function. The left  ventricle has no regional wall motion abnormalities. Left ventricular diastolic parameters are indeterminate.  2. Right ventricular systolic function is normal. The right ventricular size is normal. There is normal pulmonary artery systolic pressure.  3. The mitral valve was not well visualized. Mild mitral valve regurgitation. No evidence of mitral stenosis.  4. The tricuspid valve is abnormal. Tricuspid valve regurgitation is mild to moderate.  5. The aortic valve is tricuspid. Aortic valve regurgitation is not visualized. No aortic stenosis is present.  6. The inferior vena cava is normal in size with greater than 50% respiratory variability, suggesting right atrial pressure of 3 mmHg. FINDINGS  Left Ventricle: Left ventricular ejection fraction, by estimation, is 60 to 65%. The left ventricle has normal function. The left ventricle has no regional wall motion abnormalities. The left ventricular internal cavity size was normal in size. There is  no left ventricular hypertrophy. Left ventricular diastolic parameters are indeterminate. Right Ventricle: The right ventricular size is normal. No increase in right ventricular wall thickness. Right ventricular systolic function is normal. There is normal pulmonary artery systolic pressure. The tricuspid regurgitant velocity is 2.58 m/s, and  with an assumed right atrial pressure of 8 mmHg, the estimated right ventricular systolic pressure is 34.6 mmHg. Left Atrium: Left atrial size was normal in size. Right Atrium: Right atrial size was normal in size. Pericardium: There is no evidence of pericardial effusion. Mitral Valve: The mitral valve was not well visualized. Mild mitral valve regurgitation. No evidence of mitral valve stenosis. Tricuspid Valve: The tricuspid valve is abnormal. Tricuspid valve regurgitation is mild to moderate. No evidence of tricuspid stenosis. Aortic Valve: The aortic valve is tricuspid. Aortic valve regurgitation is not visualized. No aortic  stenosis is present. Aortic valve mean gradient measures 5.1 mmHg. Aortic valve peak gradient measures 11.4 mmHg. Aortic valve area, by VTI measures 2.37  cm. Pulmonic Valve: The pulmonic valve was normal in structure. Pulmonic valve regurgitation is not visualized. No evidence of pulmonic stenosis. Aorta: The aortic root and ascending aorta are structurally normal, with no evidence of dilitation. Venous: The inferior vena cava is normal in size with greater than 50% respiratory variability, suggesting right atrial pressure of 3 mmHg. IAS/Shunts: No atrial level shunt detected by color flow Doppler.  LEFT VENTRICLE PLAX 2D LVIDd:         3.60 cm LVIDs:         2.40 cm LV PW:         1.00 cm LV IVS:        1.00 cm LVOT diam:     2.30 cm LV SV:         57 LV SV Index:   34 LVOT Area:     4.15 cm  LV Volumes (MOD) LV vol d, MOD A2C: 61.0 ml LV vol d, MOD A4C: 49.1 ml LV vol s, MOD A2C: 27.6 ml LV vol s, MOD A4C: 21.0 ml LV SV MOD A2C:     33.4 ml LV SV MOD A4C:     49.1 ml LV SV MOD BP:      33.9 ml RIGHT VENTRICLE             IVC RV S prime:     18.00 cm/s  IVC diam: 2.10 cm TAPSE (M-mode): 1.3 cm LEFT ATRIUM           Index  RIGHT ATRIUM           Index LA diam:      2.00 cm 1.20 cm/m   RA Area:     14.60 cm LA Vol (A2C): 9.6 ml  5.79 ml/m   RA Volume:   30.80 ml  18.52 ml/m LA Vol (A4C): 17.6 ml 10.58 ml/m  AORTIC VALVE AV Area (Vmax):    2.58 cm AV Area (Vmean):   2.82 cm AV Area (VTI):     2.37 cm AV Vmax:           168.92 cm/s AV Vmean:          103.287 cm/s AV VTI:            0.240 m AV Peak Grad:      11.4 mmHg AV Mean Grad:      5.1 mmHg LVOT Vmax:         105.00 cm/s LVOT Vmean:        70.100 cm/s LVOT VTI:          0.137 m LVOT/AV VTI ratio: 0.57  AORTA Ao Root diam: 3.20 cm Ao Asc diam:  3.50 cm MITRAL VALVE               TRICUSPID VALVE MV Area (PHT): 3.65 cm    TR Peak grad:   26.6 mmHg MV Decel Time: 208 msec    TR Vmax:        258.00 cm/s MV E velocity: 85.20 cm/s                             SHUNTS                            Systemic VTI:  0.14 m                            Systemic Diam: 2.30 cm Armida Lander MD Electronically signed by Armida Lander MD Signature Date/Time: 05/10/2024/6:42:49 PM    Final     Cardiac Studies   Echo   Patient Profile     83 y.o. female status post rectosigmoid resection takedown and now with post op atrial fibrillation and flutter  Assessment & Plan    Paroxysmal atrial fib and flutter - in atrial fibrillation this morning but is rate controlled intermittently. She is not able to take po yet. She is on lopressor  5 mg Q6hr. But blood pressure is marginal so can not tolerate a dose increase. As long as her heart rate does not exceed 120 bpm we will take a lenient approach. If need will give IV digoxin. For now will hold off on anticoagulation per surgery due to bleeding.   AKI- cr is 1.07 will monitor closely. Will only use digoxin if we have to as a one time for rate control.   Syncpe - suspect vasovagal     For questions or updates, please contact [facility] Please consult [email or web address] for contact info under Cardiology/STEMI.      Signed, Tanvir Hipple, DO  05/12/2024, 9:48 AM

## 2024-05-12 NOTE — Progress Notes (Signed)
 PHARMACY - TOTAL PARENTERAL NUTRITION CONSULT NOTE   Indication: Prolonged ileus  Patient Measurements: Height: 5\' 5"  (165.1 cm) Weight: 59.6 kg (131 lb 6.3 oz) IBW/kg (Calculated) : 57 TPN AdjBW (KG): 61.3 Body mass index is 21.87 kg/m. Usual Weight:   Assessment: 4 yoF with history of diverticulitis with perforation and abscess.  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  She requested ostomy takedown 5/29 - LOA, colostomy takedown, small bowel resection with anastomosis.   Pharmacy is consulted to dose TPN on 6/2 for ileus  Glucose / Insulin : No hx DM.  Glucose 124 Electrolytes: Na low at 133, other WNL - Goal K > 4, Mag > 2 - CO2 low at 22, Cl WNL Renal: SCr increasing 0.7 > 1.07, BUN elevated to 47 Hepatic: check LFTs Intake / Output; MIVF:  - mIVF: NS @ 100 ml/hr - Output: drains 15 mL, urine (urine occurrence x1, stool x1) GI Imaging: GI Surgeries / Procedures:   Central access: PICC ordered 6/2  TPN start date: 6/2  Nutritional Goals: Goal TPN rate is 70 mL/hr (provides 84 g of protein and 1720 kcals per day)  RD Assessment:  1600-1750 kcals 80-95g protein >/= 1.6L     Current Nutrition:  No diet ordered NG tube to LIWS  Plan:  Start TPN at 30 mL/hr at 1800  Electrolytes in TPN: Na 199mEq/L, K 49mEq/L, Ca 19mEq/L, Mg 2mEq/L, and Phos 10mmol/L. Cl:Ac 1:2 Add standard MVI and trace elements to TPN Initiate Sensitive q6h SSI and adjust as needed  Reduce MIVF to 70 mL/hr at 1800 Monitor TPN labs on Mon/Thurs, mag/phos daily x3   Kendall Pauls PharmD, BCPS [facility] main pharmacy [phone] 05/12/2024 8:27 AM

## 2024-05-12 NOTE — Progress Notes (Signed)
 Peripherally Inserted Central Catheter Placement  The IV Nurse has discussed with the patient and/or persons authorized to consent for the patient, the purpose of this procedure and the potential benefits and risks involved with this procedure.  The benefits include less needle sticks, lab draws from the catheter, and the patient may be discharged home with the catheter. Risks include, but not limited to, infection, bleeding, blood clot (thrombus formation), and puncture of an artery; nerve damage and irregular heartbeat and possibility to perform a PICC exchange if needed/ordered by physician.  Alternatives to this procedure were also discussed.  Bard Power PICC patient education guide, fact sheet on infection prevention and patient information card has been provided to patient /or left at bedside. Consent signed by son at bedside (after discussion with POA) due to pt drowsiness.   PICC Placement Documentation  PICC Double Lumen 05/12/24 Right Basilic 35 cm 0 cm (Active)  Indication for Insertion or Continuance of Line Administration of hyperosmolar/irritating solutions (i.e. TPN, Vancomycin , etc.) 05/12/24 1040  Exposed Catheter (cm) 0 cm 05/12/24 1040  Site Assessment Clean, Dry, Intact 05/12/24 1040  Lumen #1 Status Saline locked;Blood return noted 05/12/24 1040  Lumen #2 Status Saline locked;Blood return noted 05/12/24 1040  Dressing Type Transparent;Securing device 05/12/24 1040  Dressing Status Antimicrobial disc/dressing in place;Clean, Dry, Intact 05/12/24 1040  Line Care Connections checked and tightened 05/12/24 1040  Line Adjustment (NICU/IV Team Only) No 05/12/24 1040  Dressing Intervention New dressing 05/12/24 1040  Dressing Change Due 05/19/24 05/12/24 1040       Mylasia Vorhees Chenice 05/12/2024, 11:08 AM

## 2024-05-12 NOTE — Progress Notes (Signed)
 PROGRESS NOTE   [name]  NUU:[id number]    DOB: 08/27/1941    DOA: 05/08/2024  PCP: Anthon Kins, MD   I have briefly reviewed patients previous medical records in [name].   Brief Hospital Course:  83 year old female, lives independently, medical history significant for remote history of A-fib evaluated with a loop recorder which was negative, at some point was on anticoagulation, had some bleeding and this was discontinued, anemia, PACs, HTN, mild cognitive impairment, syncope, large bowel obstruction prompting colon resection, creation 09/2020 and at that hospitalization found to have left leg DVT, small PE when given recent surgery with inability to use anticoagulation, had IVC filter placed that was subsequently removed, underwent elective robotic rectosigmoid resection, colostomy takedown with bowel anastomosis, extensive lysis of additions on 5/29 after preop cardiac clearance.  On 5/30 even in, patient developed AMS after polypharmacy along with A-fib with RVR and TR[facility] was consulted.  Patient was transferred to stepdown unit.  Course complicated by A-fib with RVR, cardiology consulting, on IV beta-blockers.  Also hypotension, rectal bleed and syncope x 1 on 6/1, prolonged postop ileus, plans for NG tube, PICC line and TPN.   Assessment & Plan:   Postop A-fib with RVR/PVCs Remote history of A-fib not on rate control meds or anticoagulation preop Seen by Dr. Filiberto Hug in preop clearance on 04/08/2024 Likely stress response due to recent surgery, multiple meds, postop acute blood loss anemia. After initial IV Cardizem  x 2 doses, briefly was in sinus rhythm but then reverted back to A-fib/atrial flutter with RVR. TTE 10/31 with preserved LVEF/60-65% with no abnormalities.  TSH normal. TTE with preserved LVEF.  TSH normal. Cardiology following, currently on IV metoprolol  5 Mg every 6 hours with additional as needed IV metoprolol . As discussed by cardiology with surgical  team on 6/1, no anticoagulation at this time due to some bleeding.  Hypotension Had SBP in the 70s on 6/1 afternoon. Suspected due to intravascular volume depletion Improved after IV fluid bolus and maintenance IV fluids.  Likely not related to low volume rectal bleeding  Acute kidney injury Creatinine has gone up from 0.7-1.07. Secondary to intravascular volume depletion and hypotension Continue IV hydration, TPN, avoid nephrotoxics and hypotension Trend daily BMP.  Syncope x 1 On 6/1 when patient was on bedside commode Suspected due to hypotension or vasovagal If patient drives, she must be counseled not to drive x 6 months. Echo with preserved LVEF Telemetry with A-fib  Postop ileus Management per primary service 6/1: Plan to place NG tube, PICC line and start TPN.  Essential hypertension On amlodipine  5 Mg daily PTA, currently on hold Controlled.  SBP's in the 110s.  Postop acute blood loss anemia Hemoglobin 13.8 on 5/27  Anemia likely multifactorial due to surgical blood loss, hemodilution and acute illness Hemoglobin stable in the 11 g range despite the rectal bleeding and hence suspect low volume bleeding. Follow CBC daily.  Hypomagnesemia Replaced Attempt to keep potassium >4 and magnesium  >2  Leukocytosis May be stress response.  No clinical concern for infection apart from recent abdominal surgery-defer antibiotics for that to primary service. On IV erythromycin  for GI dysmotility No clinical UTI.  Urine microscopy without concern for UTI.  Chest x-ray without pneumonia Improved  S/p colostomy takedown Management per primary service.    Mild delirium Ongoing on and off, multifactorial due to acute illness, recent surgery, meds, ICU/hospital delirium Delirium precautions Minimize opioids and sedatives.  Body mass index is 21.87 kg/m.  DVT prophylaxis: SCD's Start: 05/08/24 1759     Code Status: Full Code:  Family Communication: None at  bedside Disposition:  Patient remains critically ill with A-fib on IV meds, postop ileus requiring NG tube, PICC and TPN.     Consultants:   [facility] are consultants Cardiology/Dr. Lavonne Prairie  Procedures:     Subjective:  Patient alert and oriented x 2.  Appears somewhat confused but answers questions appropriately.  Currently denies abdominal pain.  Had an episode of nonbloody emesis early this morning.  Denies chest pain, dyspnea, dizziness, lightheadedness or palpitations.  Not excited that she has to get the NG tube back.  Objective:   Vitals:   05/12/24 0500 05/12/24 0600 05/12/24 0743 05/12/24 0800  BP: (!) 124/53 (!) 112/53 (!) 116/46 (!) 112/58  Pulse: (!) 116 (!) 119 (!) 104 (!) 112  Resp: 17 16 17 20   Temp:   97.7 F (36.5 C)   TempSrc:   Oral   SpO2: 95% 96% 96% 94%  Weight:      Height:        General exam: Elderly female, moderately built and frail sitting comfortably in bed sipping on water  from a cup with straw-advised her to stop drinking since she is going to be n.p.o.  Appears comfortable and in no distress. Respiratory system: Clear to auscultation.  No increased work of breathing. Cardiovascular system: S1 & S2 heard, RRR. No JVD, murmurs, rubs, gallops or clicks. No pedal edema.  Telemetry personally reviewed: Atrial flutter with ventricular rate in the 110-120s overnight but since 630 this morning in the 90s. Gastrointestinal system: Multiple surgical dressings in place, clean and dry.  RLQ abdominal drain with small amount of bloody drainage in the bulb.  Abdomen is definitely more distended than yesterday, not tense, appropriate.  Diffuse tenderness without peritoneal signs.  Diminished bowel sounds. Central nervous system: Alert and oriented x 2. No focal neurological deficits.  Coherent and answers questions appropriately. Extremities: Symmetric 5 x 5 power. Skin: No rashes, lesions or ulcers Psychiatry: Judgement and insight appear normal. Mood & affect  appropriate.     Data Reviewed:   I have personally reviewed following labs and imaging studies   CBC: Recent Labs  Lab 05/09/24 1817 05/10/24 0257 05/11/24 0233 05/11/24 1644 05/12/24 0247  WBC 19.7*   < > 13.0* 11.0* 14.7*  NEUTROABS 17.0*  --   --   --   --   HGB 11.4*   < > 10.5* 11.1* 11.0*  HCT 35.5*   < > 32.8* 33.9* 33.5*  MCV 98.1   < > 96.8 95.0 96.0  PLT 315   < > 323 368 353   < > = values in this interval not displayed.    Basic Metabolic Panel: Recent Labs  Lab 05/06/24 1145 05/09/24 0420 05/10/24 0257 05/10/24 0737 05/11/24 0233 05/12/24 0247  NA 138 134*  --  132* 134* 133*  K 3.9 3.8 3.8 4.0 4.2 4.3  CL 99 102  --  103 102 104  CO2 28 21*  --  24 24 20*  GLUCOSE 110* 207*  --  168* 145* 124*  BUN 18 16  --  27* 27* 47*  CREATININE 0.86 1.06* 1.10* 0.91 0.70 1.07*  CALCIUM  10.0 8.4*  --  8.3* 8.6* 8.9  MG  --  1.6*  --  2.1  --  2.2    Liver Function Tests: Recent Labs  Lab 05/06/24 1145  AST 26  ALT 21  ALKPHOS 91  BILITOT 0.5  PROT 8.1  ALBUMIN  4.3    CBG: No results for input(s): "GLUCAP" in the last 168 hours.  Microbiology Studies:   Recent Results (from the past 240 hours)  Urine Culture     Status: Abnormal   Collection Time: 05/09/24  8:30 PM   Specimen: Urine, Clean Catch  Result Value Ref Range Status   Specimen Description   Final    URINE, CLEAN CATCH Performed at [facility], [address], [place], [place] [address]    Special Requests   Final    NONE Performed at [facility], [address], [place], [place] [address]    Culture (A)  Final    <10,000 COLONIES/mL INSIGNIFICANT GROWTH Performed at [facility], [address] [place], [place] [address]    Report Status 05/11/2024 FINAL  Final    Radiology Studies:  US  EKG SITE RITE Result Date: 05/12/2024 If Site Rite image not attached, placement could not be confirmed due to current cardiac rhythm.  DG  CHEST PORT 1 VIEW Result Date: 05/11/2024 EXAM: 1 VIEW XRAY OF THE CHEST 05/11/2024 06:11:00 PM COMPARISON: 05/09/2024 CLINICAL HISTORY: Dyspnea, had bleeding from rectum earlier today and became unresponsive. FINDINGS: LUNGS AND PLEURA: No focal pulmonary opacity. No pulmonary edema. No pleural effusion. No pneumothorax. HEART AND MEDIASTINUM: No acute abnormality of the cardiac and mediastinal silhouettes. BONES AND SOFT TISSUES: No acute osseous abnormality. IMPRESSION: 1. No acute process. Electronically signed by: Zadie Herter MD 05/11/2024 07:06 PM EDT RP Workstation: [id number]   ECHOCARDIOGRAM COMPLETE Result Date: 05/10/2024    ECHOCARDIOGRAM REPORT   Patient Name:   Elizabeth Odom Date of Exam: 05/10/2024 Medical Rec #:  [id number]      Height:       65.0 in Accession #:    [id number]     Weight:       132.9 lb Date of Birth:  Feb 08, 1941     BSA:          1.663 m Patient Age:    82 years       BP:           158/72 mmHg Patient Gender: F              HR:           95 bpm. Exam Location:  Inpatient Procedure: 2D Echo, Cardiac Doppler and Color Doppler (Both Spectral and Color            Flow Doppler were utilized during procedure). Indications:    I48.91* Unspeicified atrial fibrillation  History:        Patient has prior history of Echocardiogram examinations, most                 recent 10/01/2023. Abnormal ECG, Arrythmias:Atrial Fibrillation,                 Signs/Symptoms:Altered Mental Status; Risk Factors:Hypertension                 and Dyslipidemia. Polmonary embolus. IVC filter.  Sonographer:    Raynelle Callow RDCS Referring Phys: [phone] Shantasia Hunnell D Keyandre Pileggi IMPRESSIONS  1. Left ventricular ejection fraction, by estimation, is 60 to 65%. The left ventricle has normal function. The left ventricle has no regional wall motion abnormalities. Left ventricular diastolic parameters are indeterminate.  2. Right ventricular systolic function is normal. The right ventricular size is normal. There is normal  pulmonary artery  systolic pressure.  3. The mitral valve was not well visualized. Mild mitral valve regurgitation. No evidence of mitral stenosis.  4. The tricuspid valve is abnormal. Tricuspid valve regurgitation is mild to moderate.  5. The aortic valve is tricuspid. Aortic valve regurgitation is not visualized. No aortic stenosis is present.  6. The inferior vena cava is normal in size with greater than 50% respiratory variability, suggesting right atrial pressure of 3 mmHg. FINDINGS  Left Ventricle: Left ventricular ejection fraction, by estimation, is 60 to 65%. The left ventricle has normal function. The left ventricle has no regional wall motion abnormalities. The left ventricular internal cavity size was normal in size. There is  no left ventricular hypertrophy. Left ventricular diastolic parameters are indeterminate. Right Ventricle: The right ventricular size is normal. No increase in right ventricular wall thickness. Right ventricular systolic function is normal. There is normal pulmonary artery systolic pressure. The tricuspid regurgitant velocity is 2.58 m/s, and  with an assumed right atrial pressure of 8 mmHg, the estimated right ventricular systolic pressure is 34.6 mmHg. Left Atrium: Left atrial size was normal in size. Right Atrium: Right atrial size was normal in size. Pericardium: There is no evidence of pericardial effusion. Mitral Valve: The mitral valve was not well visualized. Mild mitral valve regurgitation. No evidence of mitral valve stenosis. Tricuspid Valve: The tricuspid valve is abnormal. Tricuspid valve regurgitation is mild to moderate. No evidence of tricuspid stenosis. Aortic Valve: The aortic valve is tricuspid. Aortic valve regurgitation is not visualized. No aortic stenosis is present. Aortic valve mean gradient measures 5.1 mmHg. Aortic valve peak gradient measures 11.4 mmHg. Aortic valve area, by VTI measures 2.37  cm. Pulmonic Valve: The pulmonic valve was normal in  structure. Pulmonic valve regurgitation is not visualized. No evidence of pulmonic stenosis. Aorta: The aortic root and ascending aorta are structurally normal, with no evidence of dilitation. Venous: The inferior vena cava is normal in size with greater than 50% respiratory variability, suggesting right atrial pressure of 3 mmHg. IAS/Shunts: No atrial level shunt detected by color flow Doppler.  LEFT VENTRICLE PLAX 2D LVIDd:         3.60 cm LVIDs:         2.40 cm LV PW:         1.00 cm LV IVS:        1.00 cm LVOT diam:     2.30 cm LV SV:         57 LV SV Index:   34 LVOT Area:     4.15 cm  LV Volumes (MOD) LV vol d, MOD A2C: 61.0 ml LV vol d, MOD A4C: 49.1 ml LV vol s, MOD A2C: 27.6 ml LV vol s, MOD A4C: 21.0 ml LV SV MOD A2C:     33.4 ml LV SV MOD A4C:     49.1 ml LV SV MOD BP:      33.9 ml RIGHT VENTRICLE             IVC RV S prime:     18.00 cm/s  IVC diam: 2.10 cm TAPSE (M-mode): 1.3 cm LEFT ATRIUM           Index        RIGHT ATRIUM           Index LA diam:      2.00 cm 1.20 cm/m   RA Area:     14.60 cm LA Vol (A2C): 9.6 ml  5.79 ml/m   RA Volume:  30.80 ml  18.52 ml/m LA Vol (A4C): 17.6 ml 10.58 ml/m  AORTIC VALVE AV Area (Vmax):    2.58 cm AV Area (Vmean):   2.82 cm AV Area (VTI):     2.37 cm AV Vmax:           168.92 cm/s AV Vmean:          103.287 cm/s AV VTI:            0.240 m AV Peak Grad:      11.4 mmHg AV Mean Grad:      5.1 mmHg LVOT Vmax:         105.00 cm/s LVOT Vmean:        70.100 cm/s LVOT VTI:          0.137 m LVOT/AV VTI ratio: 0.57  AORTA Ao Root diam: 3.20 cm Ao Asc diam:  3.50 cm MITRAL VALVE               TRICUSPID VALVE MV Area (PHT): 3.65 cm    TR Peak grad:   26.6 mmHg MV Decel Time: 208 msec    TR Vmax:        258.00 cm/s MV E velocity: 85.20 cm/s                            SHUNTS                            Systemic VTI:  0.14 m                            Systemic Diam: 2.30 cm Armida Lander MD Electronically signed by Armida Lander MD Signature Date/Time:  05/10/2024/6:42:49 PM    Final     Scheduled Meds:    alvimopan   12 mg Oral BID   aspirin  EC  81 mg Oral Daily   Chlorhexidine  Gluconate Cloth  6 each Topical Daily   methocarbamol  (ROBAXIN ) injection  500 mg Intravenous Q8H   metoprolol  tartrate  5 mg Intravenous Q6H   pantoprazole (PROTONIX) IV  40 mg Intravenous QHS   sodium chloride  flush  3 mL Intravenous Q12H    Continuous Infusions:    sodium chloride      sodium chloride  100 mL/hr at 05/11/24 1742   lactated ringers        LOS: 4 days     Aubrey Blas, MD,  FACP, [facility], [facility], [facility]   [facility] [place]      To contact the attending provider between 7A-7P or the covering provider during after hours 7P-7A, please log into the web site [email or web address] and access using universal [place] password for that web site. If you do not have the password, please call the hospital operator.  05/12/2024, 8:11 AM

## 2024-05-13 DIAGNOSIS — K5790 Diverticulosis of intestine, part unspecified, without perforation or abscess without bleeding: Secondary | ICD-10-CM | POA: Diagnosis not present

## 2024-05-13 DIAGNOSIS — D62 Acute posthemorrhagic anemia: Secondary | ICD-10-CM | POA: Diagnosis not present

## 2024-05-13 DIAGNOSIS — I480 Paroxysmal atrial fibrillation: Secondary | ICD-10-CM | POA: Diagnosis not present

## 2024-05-13 DIAGNOSIS — I959 Hypotension, unspecified: Secondary | ICD-10-CM | POA: Diagnosis not present

## 2024-05-13 LAB — COMPREHENSIVE METABOLIC PANEL WITH GFR
ALT: 25 U/L (ref 0–44)
AST: 33 U/L (ref 15–41)
Albumin: 2.1 g/dL — ABNORMAL LOW (ref 3.5–5.0)
Alkaline Phosphatase: 56 U/L (ref 38–126)
Anion gap: 7 (ref 5–15)
BUN: 47 mg/dL — ABNORMAL HIGH (ref 8–23)
CO2: 26 mmol/L (ref 22–32)
Calcium: 8.7 mg/dL — ABNORMAL LOW (ref 8.9–10.3)
Chloride: 102 mmol/L (ref 98–111)
Creatinine, Ser: 0.75 mg/dL (ref 0.44–1.00)
GFR, Estimated: >60 mL/min (ref >60)
Glucose, Bld: 140 mg/dL — ABNORMAL HIGH (ref 70–99)
Potassium: 3.7 mmol/L (ref 3.5–5.1)
Sodium: 135 mmol/L (ref 135–145)
Total Bilirubin: 0.7 mg/dL (ref 0.0–1.2)
Total Protein: 5.6 g/dL — ABNORMAL LOW (ref 6.5–8.1)

## 2024-05-13 LAB — CBC
HCT: 27.4 % — ABNORMAL LOW (ref 36.0–46.0)
HCT: 29.8 % — ABNORMAL LOW (ref 36.0–46.0)
Hemoglobin: 8.7 g/dL — ABNORMAL LOW (ref 12.0–15.0)
Hemoglobin: 9.3 g/dL — ABNORMAL LOW (ref 12.0–15.0)
MCH: 31.1 pg (ref 26.0–34.0)
MCH: 31.5 pg (ref 26.0–34.0)
MCHC: 31.8 g/dL (ref 30.0–36.0)
MCHC: 31.2 g/dL (ref 30.0–36.0)
MCV: 97.9 fL (ref 80.0–100.0)
MCV: 101 fL — ABNORMAL HIGH (ref 80.0–100.0)
Platelets: 360 10*3/uL (ref 150–400)
Platelets: 398 10*3/uL (ref 150–400)
RBC: 2.8 MIL/uL — ABNORMAL LOW (ref 3.87–5.11)
RBC: 2.95 MIL/uL — ABNORMAL LOW (ref 3.87–5.11)
RDW: 14 % (ref 11.5–15.5)
RDW: 14.2 % (ref 11.5–15.5)
WBC: 15.1 10*3/uL — ABNORMAL HIGH (ref 4.0–10.5)
WBC: 12.8 10*3/uL — ABNORMAL HIGH (ref 4.0–10.5)
nRBC: 0.3 % — ABNORMAL HIGH (ref 0.0–0.2)
nRBC: 0.3 % — ABNORMAL HIGH (ref 0.0–0.2)

## 2024-05-13 LAB — GLUCOSE, CAPILLARY
Glucose-Capillary: 126 mg/dL — ABNORMAL HIGH (ref 70–99)
Glucose-Capillary: 142 mg/dL — ABNORMAL HIGH (ref 70–99)
Glucose-Capillary: 145 mg/dL — ABNORMAL HIGH (ref 70–99)
Glucose-Capillary: 152 mg/dL — ABNORMAL HIGH (ref 70–99)
Glucose-Capillary: 186 mg/dL — ABNORMAL HIGH (ref 70–99)

## 2024-05-13 LAB — PHOSPHORUS: Phosphorus: 2.2 mg/dL — ABNORMAL LOW (ref 2.5–4.6)

## 2024-05-13 LAB — TRIGLYCERIDES: Triglycerides: 119 mg/dL (ref <150)

## 2024-05-13 LAB — MAGNESIUM: Magnesium: 2.2 mg/dL (ref 1.7–2.4)

## 2024-05-13 MED ORDER — MAGIC MOUTHWASH
15.0000 mL | Freq: Four times a day (QID) | ORAL | Status: AC
  Filled 2024-05-13 (×5): qty 15

## 2024-05-13 MED ORDER — TRAVASOL 10 % IV SOLN
INTRAVENOUS | Status: AC
  Filled 2024-05-13: qty 600

## 2024-05-13 MED ORDER — SODIUM CHLORIDE 0.9 % IV SOLN: INTRAVENOUS | Status: DC

## 2024-05-13 MED ORDER — POTASSIUM PHOSPHATES 15 MMOLE/5ML IV SOLN
20.0000 mmol | Freq: Once | INTRAVENOUS | Status: AC
  Administered 2024-05-13: 20 mmol via INTRAVENOUS
  Filled 2024-05-13: qty 6.67

## 2024-05-13 MED ORDER — METOPROLOL TARTRATE 12.5 MG HALF TABLET
12.5000 mg | ORAL_TABLET | Freq: Three times a day (TID) | ORAL | Status: DC
  Administered 2024-05-13 – 2024-05-15 (×6): 12.5 mg via ORAL
  Filled 2024-05-13 (×6): qty 1

## 2024-05-13 MED ORDER — SODIUM CHLORIDE 0.9 % IV BOLUS
250.0000 mL | Freq: Once | INTRAVENOUS | Status: AC
  Administered 2024-05-13: 250 mL via INTRAVENOUS

## 2024-05-13 MED ORDER — BOOST / RESOURCE BREEZE PO LIQD CUSTOM
1.0000 | Freq: Three times a day (TID) | ORAL | Status: DC
  Administered 2024-05-13 – 2024-05-14 (×2): 1 via ORAL

## 2024-05-13 MED ORDER — PANTOPRAZOLE SODIUM 40 MG PO TBEC
40.0000 mg | DELAYED_RELEASE_TABLET | Freq: Every day | ORAL | Status: DC
  Administered 2024-05-13 – 2024-06-02 (×18): 40 mg via ORAL
  Filled 2024-05-13 (×20): qty 1

## 2024-05-13 NOTE — TOC Progression Note (Addendum)
 Transition of Care [facility]) - Progression Note    Patient Details  Name: Elizabeth Odom MRN: [id number] Date of Birth: 07-31-1941  Transition of Care [facility] CM/SW Contact  Jonni Nettle, LCSW Phone Number: 05/13/2024, 3:15 PM  Clinical Narrative:    3:01 PM - CSW attempted to speak with pt's granddaughter, Genevive Ket [phone], via phone call regarding [facility] agency preference. No answer, voicemail left. TOC will continue to follow.   Expected Discharge Plan: Home/Self Care Barriers to Discharge: Continued Medical Work up  Expected Discharge Plan and Services   Discharge Planning Services: CM Consult   Living arrangements for the past 2 months: Apartment   Social Determinants of Health (SDOH) Interventions SDOH Screenings   Food Insecurity: No Food Insecurity (05/09/2024)  Housing: Low Risk  (05/09/2024)  Transportation Needs: No Transportation Needs (05/09/2024)  Utilities: Not At Risk (05/09/2024)  Depression (PHQ2-9): Low Risk  (01/02/2024)  Social Connections: Unknown (05/09/2024)  Tobacco Use: Low Risk  (05/08/2024)    Readmission Risk Interventions    05/12/2024    2:23 PM 05/09/2024    2:46 PM 10/19/2023    6:13 PM  Readmission Risk Prevention Plan  Transportation Screening Complete Complete Complete  PCP or Specialist Appt within 5-7 Days   Complete  PCP or Specialist Appt within 3-5 Days Complete Complete   Home Care Screening   Complete  Medication Review (RN CM)   Complete  HRI or Home Care Consult Complete Complete   Social Work Consult for Recovery Care Planning/Counseling Complete Complete   Palliative Care Screening Not Applicable Not Applicable   Medication Review (RN Care Manager) Complete Complete     [place], MSW, LCSW 05/13/2024 3:16 PM

## 2024-05-13 NOTE — Progress Notes (Signed)
 Pharmacy Brief Note - Alvimopan  (Entereg )  The standing order set for alvimopan  (Entereg ) now includes an automatic order to discontinue the drug after the patient has had a bowel movement.  The change was approved by the Pharmacy & Therapeutics Committee and the Medical Executive Committee.    This patient has had a bowel movement documented by nursing.  Therefore, alvimopan  has been discontinued.  If there are questions, please contact the pharmacy at [phone].  Thank you    Cielle Aguila, PharmD, BCPS 05/13/2024 10:33 AM

## 2024-05-13 NOTE — Progress Notes (Signed)
 Occupational Therapy Treatment Patient Details Name: Elizabeth Odom MRN: [id number] DOB: 12-06-1941 Today's Date: 05/13/2024   History of present illness 83 y.o. female s/p rectosigmoid resection takedown on 5/29. 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   OT comments  Patient was motivated to participate but limited during session. Patient was noted to have multiple  large loose episodes of incontinence during session with nursing called into room after first one with noted dark red color. Patient transitioned back to bed with mod A with cues for avoiding pressure on abdomen. Patient's discharge plan remains appropriate at this time. OT will continue to follow acutely.        If plan is discharge home, recommend the following:  A lot of help with bathing/dressing/bathroom;Assistance with cooking/housework;Direct supervision/assist for medications management;Assist for transportation;Direct supervision/assist for financial management;A lot of help with walking and/or transfers;Help with stairs or ramp for entrance   Equipment Recommendations  None recommended by OT       Precautions / Restrictions Precautions Precautions: Fall Restrictions Weight Bearing Restrictions Per Provider Order: No       Mobility Bed Mobility Overal bed mobility: Needs Assistance Bed Mobility: Sit to Supine       Sit to supine: Mod assist, HOB elevated            Balance Overall balance assessment: Needs assistance Sitting-balance support: Bilateral upper extremity supported, Feet supported Sitting balance-Leahy Scale: Fair           ADL either performed or assessed with clinical judgement   ADL Overall ADL's : Needs assistance/impaired Eating/Feeding: Supervision/  safety;Sitting Eating/Feeding Details (indicate cue type and reason): drinking water  during session.                     Toilet Transfer: Stand-pivot;Rolling walker (2 wheels) Toilet Transfer Details (indicate cue type and reason): to recliner with patient reporting that she did not need to use bathroom. having large incontinence episode with standing. Toileting- Architect and Hygiene: Sit to/from stand;Total assistance Toileting - Clothing Manipulation Details (indicate cue type and reason): large incontinence episode upon getting to recliner chair and then and xtra large one transitioning back to recliner. dark red discharge with nurse in room to help with hygiene.              Cognition Arousal: Alert Behavior During Therapy: WFL for tasks assessed/performed, Flat affect Cognition: No apparent impairments             OT - Cognition Comments: patient was plesant and cooperative wants to get back home alone.                 Following commands: Intact                      Pertinent Vitals/ Pain       Pain Assessment Pain Assessment: Faces Faces Pain Scale: Hurts even more Pain Location: abdomen Pain Descriptors / Indicators: Grimacing, Constant, Discomfort Pain Intervention(s): Limited activity within patient's tolerance, Monitored during session, Repositioned         Frequency  Min 2X/week        Progress Toward Goals  OT Goals(current goals can now be found in the care plan section)  Progress towards OT goals: Progressing toward goals  Plan         AM-PAC OT "6 Clicks" Daily Activity     Outcome Measure   Help from another person eating meals?: A Little Help from another person taking care of personal grooming?: A Little Help from another person toileting, which includes using toliet, bedpan, or urinal?: A Lot Help from another person bathing (including washing, rinsing, drying)?: A Lot Help from another person to  put on and taking off regular upper body clothing?: A Little Help from another person to put on and taking off regular lower body clothing?: A Lot 6 Click Score: 15    End of Session Equipment Utilized During Treatment: Rolling walker (2 wheels)  OT Visit Diagnosis: Unsteadiness on feet (R26.81);Pain;Muscle weakness (generalized) (M62.81)   Activity Tolerance Other (comment) (large loose Bms)   Patient Left in bed;with call bell/phone within reach;with nursing/sitter in room   Nurse Communication Other (comment) (nurse called into room after first one secondary to color of stool. nurse present with patient rest of session.)        Time: 1308-6578 OT Time Calculation (min): 29 min  Charges: OT General Charges $OT Visit: 1 Visit OT Treatments $Self Care/Home Management : 23-37 mins  Wynette Heckler, MS Acute Rehabilitation Department Office# [phone]   Jame Maze 05/13/2024, 9:30 AM

## 2024-05-13 NOTE — Progress Notes (Signed)
 Report called. Patient belongings collected Tax inspector, cell phone & charger). Patient stable at time of tranfer

## 2024-05-13 NOTE — Progress Notes (Signed)
 Physical Therapy Treatment Patient Details Name: Elizabeth Odom MRN: [id number] DOB: 01/11/1941 Today's Date: 05/13/2024   History of Present Illness 83 y.o. female s/p rectosigmoid resection takedown on 5/29. 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    PT Comments  PT - Cognition Comments: AxO x 2 pleasant, nervous, Claustrophobic does not like door or curtain shut, lives home alone. Transported from step down to unit.  General transfer comment: assisted from transport chair to [facility] quickly due to uncontrolled watery loose stools.  Assisted again from [facility] to bed with RN/NT loose stools continue.  Unable to amb. General Gait Details: deferred due to MAX watery, loose stools continuous.  Assisted back to bed and positioned to comfort.  Pt repeats "I want to go home".   LPT has rec HH PT.     If plan is discharge home, recommend the following: A little help with walking and/or transfers;A little help with bathing/dressing/bathroom;Assistance with cooking/housework;Assist for transportation;Help with stairs or ramp for entrance   Can travel by private vehicle        Equipment Recommendations  None recommended by PT    Recommendations for Other Services       Precautions / Restrictions Precautions Precautions: Fall Precaution/Restrictions Comments: ABD surgery Restrictions Weight Bearing Restrictions Per Provider Order: No     Mobility  Bed Mobility               General bed mobility comments: OOB in transport chair    Transfers Overall transfer level: Needs assistance Equipment used: Rolling walker (2 wheels) Transfers: Sit to/from Stand, Bed to chair/wheelchair/BSC Sit to Stand: +2 safety/equipment, Min assist, Contact guard assist, Supervision            General transfer comment: assisted from transport chair to [facility] quickly due to uncontrolled watery loose stools.  Assisted again from [facility] to bed with RN/NT loose stools continue.  Unable to amb.    Ambulation/Gait               General Gait Details: deferred due to MAX watery, loose stools continous   Stairs             Wheelchair Mobility     Tilt Bed    Modified Rankin (Stroke Patients Only)       Balance                                            Communication    Cognition Arousal: Alert Behavior During Therapy: WFL for tasks assessed/performed                           PT - Cognition Comments: AxO x 2 pleasant, nervous, Claustrophobic does not like door or curtain shut, lives home alone. Following commands: Intact      Cueing Cueing Techniques: Verbal cues, Gestural cues  Exercises      General Comments        Pertinent Vitals/Pain Pain Assessment Pain Assessment: Faces Faces Pain Scale: Hurts little more Pain Location: abdomen with activity Pain Descriptors / Indicators: Grimacing, Constant, Discomfort Pain Intervention(s): Monitored during session    Home Living  Prior Function            PT Goals (current goals can now be found in the care plan section) Progress towards PT goals: Progressing toward goals    Frequency    Min 3X/week      PT Plan      Co-evaluation              AM-PAC PT "6 Clicks" Mobility   Outcome Measure  Help needed turning from your back to your side while in a flat bed without using bedrails?: A Lot Help needed moving from lying on your back to sitting on the side of a flat bed without using bedrails?: A Lot Help needed moving to and from a bed to a chair (including a wheelchair)?: A Lot Help needed standing up from a chair using your arms (e.g., wheelchair or bedside chair)?: A Lot Help needed to walk in hospital room?: A  Lot Help needed climbing 3-5 steps with a railing? : A Lot 6 Click Score: 12    End of Session Equipment Utilized During Treatment: Gait belt Activity Tolerance: Treatment limited secondary to medical complications (Comment) Patient left: in bed;with call bell/phone within reach   PT Visit Diagnosis: Unsteadiness on feet (R26.81);Muscle weakness (generalized) (M62.81);Difficulty in walking, not elsewhere classified (R26.2);Pain     Time: 1610-9604 PT Time Calculation (min) (ACUTE ONLY): 24 min  Charges:    $Therapeutic Activity: 23-37 mins PT General Charges $$ ACUTE PT VISIT: 1 Visit                     Bess Broody  PTA Acute  Rehabilitation Services Office M-F          [phone]

## 2024-05-13 NOTE — Plan of Care (Signed)
  Problem: Activity: Goal: Ability to tolerate increased activity will improve Outcome: Progressing   Problem: Bowel/Gastric: Goal: Gastrointestinal status for postoperative course will improve Outcome: Progressing   Problem: Nutritional: Goal: Will attain and maintain optimal nutritional status will improve Outcome: Progressing   Problem: Clinical Measurements: Goal: Postoperative complications will be avoided or minimized Outcome: Progressing   Problem: Respiratory: Goal: Respiratory status will improve Outcome: Progressing   Problem: Skin Integrity: Goal: Will show signs of wound healing Outcome: Progressing   Problem: Education: Goal: Knowledge of General Education information will improve Description: Including pain rating scale, medication(s)/side effects and non-pharmacologic comfort measures Outcome: Progressing   Problem: Clinical Measurements: Goal: Will remain free from infection Outcome: Progressing Goal: Cardiovascular complication will be avoided Outcome: Progressing   Problem: Activity: Goal: Risk for activity intolerance will decrease Outcome: Progressing   Problem: Nutrition: Goal: Adequate nutrition will be maintained Outcome: Progressing   Problem: Coping: Goal: Level of anxiety will decrease Outcome: Progressing   Problem: Elimination: Goal: Will not experience complications related to bowel motility Outcome: Progressing Goal: Will not experience complications related to urinary retention Outcome: Progressing   Problem: Pain Managment: Goal: General experience of comfort will improve and/or be controlled Outcome: Progressing   Problem: Safety: Goal: Ability to remain free from injury will improve Outcome: Progressing   Problem: Skin Integrity: Goal: Risk for impaired skin integrity will decrease Outcome: Progressing   Problem: Metabolic: Goal: Ability to maintain appropriate glucose levels will improve Outcome: Progressing    Problem: Nutritional: Goal: Maintenance of adequate nutrition will improve Outcome: Progressing   Problem: Tissue Perfusion: Goal: Adequacy of tissue perfusion will improve Outcome: Progressing

## 2024-05-13 NOTE — Plan of Care (Signed)
  Problem: Education: Goal: Understanding of discharge needs will improve Outcome: Progressing   Problem: Activity: Goal: Ability to tolerate increased activity will improve Outcome: Not Progressing   Problem: Bowel/Gastric: Goal: Gastrointestinal status for postoperative course will improve Outcome: Not Progressing

## 2024-05-13 NOTE — Progress Notes (Addendum)
 PROGRESS NOTE   Elizabeth Odom  ZOX:[id number]    DOB: Apr 07, 1941    DOA: 05/08/2024  PCP: Anthon Kins, MD   I have briefly reviewed patients previous medical records in [facility].   Brief Hospital Course:  83 year old female, lives independently, medical history significant for remote history of A-fib evaluated with a loop recorder which was negative, at some point was on anticoagulation, had some bleeding and this was discontinued, anemia, PACs, HTN, mild cognitive impairment, syncope, large bowel obstruction prompting colon resection, creation 09/2020 and at that hospitalization found to have left leg DVT, small PE when given recent surgery with inability to use anticoagulation, had IVC filter placed that was subsequently removed, underwent elective robotic rectosigmoid resection, colostomy takedown with bowel anastomosis, extensive lysis of additions on 5/29 after preop cardiac clearance.  On 5/30 even in, patient developed AMS after polypharmacy along with A-fib with RVR and [facility] was consulted.  Patient was transferred to stepdown unit.  Course complicated by A-fib with RVR, cardiology consulting, on IV beta-blockers.  Also hypotension, rectal bleed and syncope x 1 on 6/1, prolonged postop ileus, currently with NG tube, PICC line and TPN.   Assessment & Plan:   Postop A-fib with RVR/PVCs Remote history of A-fib not on rate control meds or anticoagulation preop Seen by Dr. Filiberto Hug in preop clearance on 04/08/2024 Likely stress response due to recent surgery, multiple meds, postop acute blood loss anemia. TTE 10/31 with preserved LVEF/60-65% with no abnormalities.  TSH normal. TTE with preserved LVEF.  TSH normal. Cardiology following, currently on IV metoprolol  5 Mg every 6 hours with additional as needed IV metoprolol . Rate mostly controlled in the 90s-100's which is acceptable.  As per cardiology follow-up yesterday, if needed, can give IV digoxin. When able to take p.o.,  beta-blockers can be transitioned to p.o. with cardiology assistance. General surgeon said advised to hold off on IV heparin  drip initially due to concern for postop bleeding.  However as per their follow-up on 6/2, advised that it is reasonable to start IV heparin  drip with low threshold to hold if worsening bleeding or following hemoglobin.  Since hemoglobin has dropped some as noted below, hold off anticoagulation for now.  Hypotension Had SBP in the 70s on 6/1 afternoon. Suspected due to intravascular volume depletion Improved after IV fluid bolus and maintenance IV fluids.  Likely not related to low volume rectal bleeding Soft blood pressures with SBP mostly in the 100s. Despite TPN, appears volume depleted.  Will give additional IV fluid bolus to 50 mL.  Acute kidney injury Creatinine has gone up from 0.7-1.07. Secondary to intravascular volume depletion and hypotension Continue IV hydration, TPN, avoid nephrotoxics and hypotension Resolved.  Monitor.  Syncope x 1 On 6/1 when patient was on bedside commode Suspected due to hypotension or vasovagal If patient drives, she must be counseled not to drive x 6 months. Echo with preserved LVEF Telemetry with A-fib with controlled ventricular rate.  Postop ileus Management per primary service 6/1: Placed NG tube, PICC line and start TPN. 6/2: Surgeons attempting NG tube clamping and clear liquids trial.  Essential hypertension On amlodipine  5 Mg daily PTA, currently on hold Soft blood pressures as noted above.  Postop acute blood loss anemia Hemoglobin 13.8 on 5/27  Anemia likely multifactorial due to surgical blood loss, hemodilution and acute illness Hemoglobin had been stable in the 11 g range for several days despite low volume rectal bleeding.  However on 6/3, hemoglobin has dropped from  11-8.7 in the absence of significant bleeding except in the abdominal drain Follow CBCs closely and transfuse if hemoglobin 7 g or  less.  Addendum Hemoglobin this afternoon up to 9.3.  Acute urinary retention Bladder scan this afternoon showed 337 mL.  Some of her abdominal pain may be related to this.  In and out catheterization as needed and hopefully will not need Foley to be left in [place].  Minimize opioids.  Hypomagnesemia Replaced Attempt to keep potassium >4 and magnesium  >2  Hypophosphatemia Replaced by pharmacy in TPN and monitor.  Leukocytosis May be stress response.  No clinical concern for infection apart from recent abdominal surgery-defer antibiotics for that to primary service. Completed IV azithromycin for GI dysmotility No clinical UTI.  Urine microscopy without concern for UTI.  Chest x-ray without pneumonia Fluctuating but overall stable  S/p colostomy takedown Management per primary service.    Mild delirium Ongoing on and off, multifactorial due to acute illness, recent surgery, meds, ICU/hospital delirium Delirium precautions Minimize opioids and sedatives.  Body mass index is 21.61 kg/m.   DVT prophylaxis: SCD's Start: 05/08/24 1759     Code Status: Full Code:  Family Communication: None at bedside.  Unable to reach patient's family/granddaughter via phone. Disposition:  Patient remains critically ill with A-fib on IV meds, postop ileus requiring NG tube, PICC and TPN.     Consultants:   [facility] are consultants Cardiology/Dr. Lavonne Prairie  Procedures:     Subjective:  Patient reported having BM this morning-dark-colored, abdominal pain rated as 8-9/10, confirmed by nursing.  Not much abdominal pain.  No chest pain, dyspnea or palpitations.  Per nursing, intermittent confusion, worse at night.  Reports feeling thirsty.  Objective:   Vitals:   05/13/24 0400 05/13/24 0500 05/13/24 0600 05/13/24 0800  BP: (!) 107/51 (!) 127/44 (!) 93/44 (!) 103/53  Pulse: (!) 106 (!) 117 94 (!) 107  Resp: (!) 22 (!) 24 20 (!) 22  Temp: 98.9 F (37.2 C)     TempSrc: Axillary     SpO2:  96% 96% 96% 95%  Weight:  58.9 kg    Height:        General exam: Elderly female, moderately built and frail sitting up in bed, does not appear in any distress. Respiratory system: Clear to auscultation.  No increased work of breathing. Cardiovascular system: S1 & S2 heard, RRR. No JVD, murmurs, rubs, gallops or clicks. No pedal edema.  Telemetry personally reviewed: A-fib with controlled ventricular rate in the 90s-100s. Gastrointestinal system: Multiple surgical dressings in place, clean and dry.  RLQ abdominal drain with small amount of bloody drainage in the bulb.  Abdomen is less red than yesterday, soft and nontender.?  Suprapubic distention-will get bladder scan.  Some bowel sounds heard. Central nervous system: Alert and oriented x 2. No focal neurological deficits.  Coherent and answers questions appropriately. Extremities: Symmetric 5 x 5 power. Skin: No rashes, lesions or ulcers Psychiatry: Judgement and insight somewhat impaired. Mood & affect appropriate.     Data Reviewed:   I have personally reviewed following labs and imaging studies   CBC: Recent Labs  Lab 05/09/24 1817 05/10/24 0257 05/11/24 1644 05/12/24 0247 05/13/24 0521  WBC 19.7*   < > 11.0* 14.7* 15.1*  NEUTROABS 17.0*  --   --   --   --   HGB 11.4*   < > 11.1* 11.0* 8.7*  HCT 35.5*   < > 33.9* 33.5* 27.4*  MCV 98.1   < > 95.0  96.0 97.9  PLT 315   < > 368 353 360   < > = values in this interval not displayed.    Basic Metabolic Panel: Recent Labs  Lab 05/09/24 0420 05/10/24 0257 05/10/24 0737 05/11/24 0233 05/12/24 0247 05/13/24 0521  NA 134*  --  132* 134* 133* 135  K 3.8 3.8 4.0 4.2 4.3 3.7  CL 102  --  103 102 104 102  CO2 21*  --  24 24 20* 26  GLUCOSE 207*  --  168* 145* 124* 140*  BUN 16  --  27* 27* 47* 47*  CREATININE 1.06* 1.10* 0.91 0.70 1.07* 0.75  CALCIUM  8.4*  --  8.3* 8.6* 8.9 8.7*  MG 1.6*  --  2.1  --  2.2 2.2  PHOS  --   --   --   --  3.1 2.2*    Liver Function  Tests: Recent Labs  Lab 05/06/24 1145 05/13/24 0521  AST 26 33  ALT 21 25  ALKPHOS 91 56  BILITOT 0.5 0.7  PROT 8.1 5.6*  ALBUMIN  4.3 2.1*    CBG: Recent Labs  Lab 05/12/24 1742 05/13/24 0008 05/13/24 0625  GLUCAP 108* 126* 142*    Microbiology Studies:   Recent Results (from the past 240 hours)  Urine Culture     Status: Abnormal   Collection Time: 05/09/24  8:30 PM   Specimen: Urine, Clean Catch  Result Value Ref Range Status   Specimen Description   Final    URINE, CLEAN CATCH Performed at [facility], [address] [place], [place] [address]    Special Requests   Final    NONE Performed at [facility], [address], [place], [place] [address]    Culture (A)  Final    <10,000 COLONIES/mL INSIGNIFICANT GROWTH Performed at [facility], [address], [place], [place] [address]    Report Status 05/11/2024 FINAL  Final    Radiology Studies:  DG CHEST PORT 1 VIEW Result Date: 05/12/2024 CLINICAL DATA:  [id number] S/P PICC central line placement [id number] EXAM: PORTABLE CHEST - 1 VIEW COMPARISON:  May 11, 2024 FINDINGS: Right PICC terminates lower SVC. Esophagogastric tube extends below the level of the diaphragms with the distal tip outside the field of view. However, the last side hole appears in the region of the stomach. No focal airspace consolidation, pleural effusion, or pneumothorax. No cardiomegaly. IMPRESSION: 1. Right PICC terminates lower SVC. 2. Esophagogastric tube extends below the level of the diaphragm with the distal tip outside the field of view. However, the last sidehole appears in the region of the stomach. Electronically Signed   By: Rance Burrows M.D.   On: 05/12/2024 11:59   DG Abd 1 View Result Date: 05/12/2024 CLINICAL DATA:  NG tube placement EXAM: ABDOMEN - 1 VIEW COMPARISON:  October 26, 2023 FINDINGS: the tip of the nasogastric tube is located within the antrum stomach in good position. IVC  filter in place IMPRESSION: Nasogastric tube in good position. Electronically Signed   By: Fredrich Jefferson M.D.   On: 05/12/2024 08:53   US  EKG SITE RITE Result Date: 05/12/2024 If Site Rite image not attached, placement could not be confirmed due to current cardiac rhythm.  DG CHEST PORT 1 VIEW Result Date: 05/11/2024 EXAM: 1 VIEW XRAY OF THE CHEST 05/11/2024 06:11:00 PM COMPARISON: 05/09/2024 CLINICAL HISTORY: Dyspnea, had bleeding from rectum earlier today and became unresponsive. FINDINGS: LUNGS AND PLEURA: No focal pulmonary  opacity. No pulmonary edema. No pleural effusion. No pneumothorax. HEART AND MEDIASTINUM: No acute abnormality of the cardiac and mediastinal silhouettes. BONES AND SOFT TISSUES: No acute osseous abnormality. IMPRESSION: 1. No acute process. Electronically signed by: Zadie Herter MD 05/11/2024 07:06 PM EDT RP Workstation: [id number]    Scheduled Meds:    alvimopan   12 mg Oral BID   aspirin  EC  81 mg Oral Daily   Chlorhexidine  Gluconate Cloth  6 each Topical Daily   insulin  aspart  0-9 Units Subcutaneous Q6H   magic mouthwash  15 mL Oral QID   methocarbamol  (ROBAXIN ) injection  500 mg Intravenous Q8H   metoprolol  tartrate  5 mg Intravenous Q6H   pantoprazole  40 mg Oral Daily   sodium chloride  flush  10-40 mL Intracatheter Q12H   sodium chloride  flush  3 mL Intravenous Q12H   thiamine  (VITAMIN B1) injection  100 mg Intravenous Q24H    Continuous Infusions:    sodium chloride      lactated ringers      TPN ADULT (ION) 30 mL/hr at 05/13/24 0410     LOS: 5 days     Aubrey Blas, MD,  FACP, [facility], [facility], [facility]   [facility] [place]      To contact the attending provider between 7A-7P or the covering provider during after hours 7P-7A, please log into the web site [email or web address] and access using universal [place] password for that web site. If you do not have the password, please call the hospital  operator.  05/13/2024, 8:20 AM

## 2024-05-13 NOTE — Progress Notes (Signed)
 Progress Note  Patient Name: Elizabeth Odom Date of Encounter: 05/13/2024  Primary Cardiologist: Cody Das, MD   Subjective   Patient seen and examined.  She was awake when I arrived.  It appears that she is having quit now.  But she tells me that she is having difficulty swallowing this.  During the time of my visit she was eating water  ice.  Inpatient Medications    Scheduled Meds:  aspirin  EC  81 mg Oral Daily   Chlorhexidine  Gluconate Cloth  6 each Topical Daily   feeding supplement  1 Container Oral TID BM   insulin  aspart  0-9 Units Subcutaneous Q6H   magic mouthwash  15 mL Oral QID   methocarbamol  (ROBAXIN ) injection  500 mg Intravenous Q8H   metoprolol  tartrate  5 mg Intravenous Q6H   pantoprazole  40 mg Oral Daily   sodium chloride  flush  10-40 mL Intracatheter Q12H   sodium chloride  flush  3 mL Intravenous Q12H   thiamine  (VITAMIN B1) injection  100 mg Intravenous Q24H   Continuous Infusions:  sodium chloride      lactated ringers      potassium PHOSPHATE IVPB (in mmol)     TPN ADULT (ION) 30 mL/hr at 05/13/24 0410   TPN ADULT (ION)     PRN Meds: sodium chloride , alum & mag hydroxide-simeth, diphenhydrAMINE  **OR** diphenhydrAMINE , fentaNYL  (SUBLIMAZE ) injection, hydrALAZINE , lactated ringers , menthol -cetylpyridinium, metoprolol  tartrate, naphazoline-glycerin , [DISCONTINUED] ondansetron  **OR** ondansetron  (ZOFRAN ) IV, mouth rinse, phenol, [DISCONTINUED] prochlorperazine  **OR** prochlorperazine , simethicone , sodium chloride , sodium chloride  flush, sodium chloride  flush   Vital Signs    Vitals:   05/13/24 0500 05/13/24 0600 05/13/24 0800 05/13/24 0900  BP: (!) 127/44 (!) 93/44 (!) 103/53 (!) 125/54  Pulse: (!) 117 94 (!) 107   Resp: (!) 24 20 (!) 22 (!) 23  Temp:   98 F (36.7 C)   TempSrc:   Oral   SpO2: 96% 96% 95%   Weight: 58.9 kg     Height:        Intake/Output Summary (Last 24 hours) at 05/13/2024 1044 Last data filed at 05/13/2024 0943 Gross  per 24 hour  Intake 565.2 ml  Output 1323 ml  Net -757.8 ml   Filed Weights   05/10/24 0500 05/11/24 0404 05/13/24 0500  Weight: 60.3 kg 59.6 kg 58.9 kg    Telemetry    Atrial fibrillation but rate controlled  - Personally Reviewed  ECG     - Personally Reviewed  Physical Exam   General: Comfortable Head: Atraumatic, normal size  Eyes: PEERLA, EOMI  Neck: Supple, normal JVD Cardiac: Normal S1, S2; RRR; no murmurs, rubs, or gallops Lungs: Clear to auscultation bilaterally Abd: Soft, nontender, no hepatomegaly  Ext: warm, no edema Musculoskeletal: No deformities, BUE and BLE strength normal and equal Skin: Warm and dry, no rashes   Neuro: Alert and oriented to person, place, time, and situation, CNII-XII grossly intact, no focal deficits  Psych: Normal mood and affect   Labs    Chemistry Recent Labs  Lab 05/06/24 1145 05/09/24 0420 05/11/24 0233 05/12/24 0247 05/13/24 0521  NA 138   < > 134* 133* 135  K 3.9   < > 4.2 4.3 3.7  CL 99   < > 102 104 102  CO2 28   < > 24 20* 26  GLUCOSE 110*   < > 145* 124* 140*  BUN 18   < > 27* 47* 47*  CREATININE 0.86   < > 0.70 1.07*  0.75  CALCIUM  10.0   < > 8.6* 8.9 8.7*  PROT 8.1  --   --   --  5.6*  ALBUMIN  4.3  --   --   --  2.1*  AST 26  --   --   --  33  ALT 21  --   --   --  25  ALKPHOS 91  --   --   --  56  BILITOT 0.5  --   --   --  0.7  GFRNONAA >60   < > >60 52* >60  ANIONGAP 11   < > 8 9 7    < > = values in this interval not displayed.     Hematology Recent Labs  Lab 05/11/24 1644 05/12/24 0247 05/13/24 0521  WBC 11.0* 14.7* 15.1*  RBC 3.57* 3.49* 2.80*  HGB 11.1* 11.0* 8.7*  HCT 33.9* 33.5* 27.4*  MCV 95.0 96.0 97.9  MCH 31.1 31.5 31.1  MCHC 32.7 32.8 31.8  RDW 13.6 13.7 14.0  PLT 368 353 360    Cardiac EnzymesNo results for input(s): "TROPONINI" in the last 168 hours. No results for input(s): "TROPIPOC" in the last 168 hours.   BNPNo results for input(s): "BNP", "PROBNP" in the last 168  hours.   DDimer No results for input(s): "DDIMER" in the last 168 hours.   Radiology    DG CHEST PORT 1 VIEW Result Date: 05/12/2024 CLINICAL DATA:  [id number] S/P PICC central line placement [id number] EXAM: PORTABLE CHEST - 1 VIEW COMPARISON:  May 11, 2024 FINDINGS: Right PICC terminates lower SVC. Esophagogastric tube extends below the level of the diaphragms with the distal tip outside the field of view. However, the last side hole appears in the region of the stomach. No focal airspace consolidation, pleural effusion, or pneumothorax. No cardiomegaly. IMPRESSION: 1. Right PICC terminates lower SVC. 2. Esophagogastric tube extends below the level of the diaphragm with the distal tip outside the field of view. However, the last sidehole appears in the region of the stomach. Electronically Signed   By: Rance Burrows M.D.   On: 05/12/2024 11:59   DG Abd 1 View Result Date: 05/12/2024 CLINICAL DATA:  NG tube placement EXAM: ABDOMEN - 1 VIEW COMPARISON:  October 26, 2023 FINDINGS: the tip of the nasogastric tube is located within the antrum stomach in good position. IVC filter in place IMPRESSION: Nasogastric tube in good position. Electronically Signed   By: Fredrich Jefferson M.D.   On: 05/12/2024 08:53   US  EKG SITE RITE Result Date: 05/12/2024 If Site Rite image not attached, placement could not be confirmed due to current cardiac rhythm.  DG CHEST PORT 1 VIEW Result Date: 05/11/2024 EXAM: 1 VIEW XRAY OF THE CHEST 05/11/2024 06:11:00 PM COMPARISON: 05/09/2024 CLINICAL HISTORY: Dyspnea, had bleeding from rectum earlier today and became unresponsive. FINDINGS: LUNGS AND PLEURA: No focal pulmonary opacity. No pulmonary edema. No pleural effusion. No pneumothorax. HEART AND MEDIASTINUM: No acute abnormality of the cardiac and mediastinal silhouettes. BONES AND SOFT TISSUES: No acute osseous abnormality. IMPRESSION: 1. No acute process. Electronically signed by: Zadie Herter MD 05/11/2024 07:06 PM EDT RP  Workstation: [id number]    Cardiac Studies   Echo   Patient Profile     83 y.o. female status post rectosigmoid resection takedown and now with post op atrial fibrillation and flutter  Assessment & Plan    Paroxysmal atrial fib and flutter - in atrial fibrillation this morning but is rate controlled intermittently.  It seems  that some of her medication has been transitioned to p.o. and it appears that she is taking them.  So we will convert her Lopressor  to 12.5 mg every 8 hours.  As long as her heart rate does not exceed 120 bpm we will take a lenient approach. If need will give IV digoxin. For now will hold off on anticoagulation per surgery due to bleeding-we will wait for their clearance before adding anticoagulation at this time.  AKI-this has resolved.  Syncpe - suspect vasovagal     For questions or updates, please contact [facility] Please consult [email or web address] for contact info under Cardiology/STEMI.      Signed, Jamiyla Ishee, DO  05/13/2024, 10:44 AM

## 2024-05-13 NOTE — Progress Notes (Signed)
 PHARMACY - TOTAL PARENTERAL NUTRITION CONSULT NOTE   Indication: Prolonged ileus  Patient Measurements: Height: 5\' 5"  (165.1 cm) Weight: 58.9 kg (129 lb 13.6 oz) IBW/kg (Calculated) : 57 TPN AdjBW (KG): 61.3 Body mass index is 21.61 kg/m. Usual Weight:   Assessment: 72 yoF with history of diverticulitis with perforation and abscess.  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  She requested ostomy takedown 5/29 - LOA, colostomy takedown, small bowel resection with anastomosis.   Pharmacy is consulted to dose TPN on 6/2 for ileus  Glucose / Insulin :  - No hx DM.   - Glucose  ( goal <150) 116-142 Electrolytes:  - Na improved to 135, K+ on low end at 3.7, phosphorus is 2.2  other WNL - Goal K > 4, Mag > 2 - CorrCa is 10.2 WNL - CO2 low at 22, Cl WNL Renal: SCr improved to <1 , BUN elevated to 47 Hepatic: WNL Intake / Output; MIVF:  - mIVF:  - Output: drains 15 mL, urine (urine occurrence x1, stool x1) GI Imaging: GI Surgeries / Procedures:   Central access: PICC ordered 6/2  TPN start date: 6/2  Nutritional Goals: Goal TPN rate is 70 mL/hr (provides 84 g of protein and 1720 kcals per day)  RD Assessment:  1600-1750 kcals 80-95g protein >/= 1.6L  Estimated Needs Total Energy Estimated Needs: 1600-1750 kcals Total Protein Estimated Needs: 80-95 grams Total Fluid Estimated Needs: >/= 1.6L  Current Nutrition:  No diet ordered NG tube to LIWS  Plan:  Now  Potassium phosphate 20 mmol IV x1   Increase TPN at 50 mL/hr at 1800  Electrolytes in TPN:  Na inc to 170mEq/L,  K inc to  57mEq/L,  Ca 48mEq/L,  Mg 2mEq/L  Phos inc to 12mmol/L.  Cl:Ac 1:2 Add standard MVI and trace elements to TPN Initiate Sensitive q6h SSI and adjust as needed  Thiamine  100 mg IV daily x 5 through 6/6  Reduce MIVF to 50 mL/hr at 1800 Monitor TPN labs on Mon/Thurs,  mag/phos daily x3   Barry Culverhouse, PharmD, BCPS 05/13/2024 10:13 AM

## 2024-05-13 NOTE — Progress Notes (Signed)
 05/13/2024  Elizabeth Odom [id number] Jan 24, 1941  CARE TEAM: PCP: Anthon Kins, MD  Outpatient Care Team: Patient Care Team: Anthon Kins, MD as PCP - General (Internal Medicine) Cody Das, MD as PCP - Cardiology (Cardiology) Lajuan Pila, MD as Consulting Physician (Gastroenterology) Shela Derby, MD as Consulting Physician (General Surgery) Glory Larsen, MD (Neurology) Lahoma Pigg, MD as Consulting Physician (Urology) Sandee Crook Margart Shears, MD as Consulting Physician (Cardiology) Candyce Champagne, MD as Consulting Physician (Colon and Rectal Surgery)  Inpatient Treatment Team: Treatment Team:  Candyce Champagne, MD Ruel Cotta, Heinz Llano, MD Eliot Guernsey, MD [facility], Zola Hint, MD Lawayne President, RRT Roberta Chin, RN Jame Maze, OT Layvonne Primas, NT Powell, Cadence, NT   Problem List:   Principal Problem:   Diverticular disease Active Problems:   Protein-calorie malnutrition, severe   05/08/2024  POST-OPERATIVE DIAGNOSIS:   COLOSTOMY FOR RESECTION, DESIRE FOR OSTOMY TAKEDOWN RECTAL STRICTURE HISTORY OF DIVERTICULITIS WITH PERFORATION & ABSCESS   PROCEDURE:   -ROBOTIC RECTOSIGMOID RESECTION (LAR) -TAKEDOWN OF END COLOSTOMY WITH ANASTOMOSIS -RESECTION OF SMALL INTESTINE WITH ANASTOMOSIS -SMALL BOWEL REPAIR -LYSIS OF ADHESIONS x 115 MINUTES (66% OF CASE),  -INTRAOPERATIVE ASSESSMENT OF TISSUE VASCULAR PERFUSION USING ICG (indocyanine green ) IMMUNOFLUORESCENCE,  -TRANSVERSUS ABDOMINIS PLANE (TAP) BLOCK - BILATERAL -FLEXIBLE SIGMOIDOSCOPY   SURGEON:  Eddye Goodie, MD  OR FINDINGS:  Very dense adhesions of small bowel especially to pelvis and retroperitoneum.  Extremely concrete dense adhesions with friable tissue near her old prior small bowel anastomosis and distal jejunum.  Required small bowel repair and required small bowel resection including old anastomosis given severe tissues.  Patient had significant fibrotic  stricturing of rectal stump at the mid/distal rectal junction.  Could not be released up.  Ended up doing resection of rectal stump to mid/distal rectal junction.  No obvious metastatic disease on visceral parietal peritoneum or liver.   It is a 29mm EEA anastomosis ( distal descending colon  connected to mid/distal rectal junction.)  It rests 7 cm from the anal verge by rigid proctoscopy.  Assessment [facility] Stay = 5 days) 5 Days Post-Op    Ileus appears to be resolving.    Plan:  NG tube clamping trial with clear liquids today.  If low output and tolerates with persistent flatus/bowel movements; then, remove NG tube and advance to full liquids tomorrow 6/4.  If having worsening symptoms or high residuals return to suction and reattempt daily  TPN through PICC line.  Atrial fibrillation postop.  History of a prior event.    Cardiology initially did not feel as needed but now is leading towards trying to do full anticoagulation.    She had some hematochezia but did not seem too severe.  Hemoglobin was stable but did drift down today.  Reasonable to start with IV heparin  gtt with low threshold to hold if has worsening bleeding or following hemoglobin   Rate control through IV means.  Looks like scheduled metoprolol  somewhat helping   Pain control.  Fentanyl  for now.  Rehydration as tolerated.  -monitor electrolytes & replace as needed  Keep K>4, Mg>2, Phos>3  -VTE prophylaxis- SCDs.  Anticoagulation as appropriate  -mobilize as tolerated to help recovery.  Enlist therapies in moderate/high risk patients as appropriate  I think she is stable enough to go back up to the floor.  Reasonable placed on telemetry to keep an eye on cardiac rate.  Cardiology and medicine help appreciated.  I updated the patient's status to the  patient and nurse.  Discussed with [facility] internal medicine.  Asked Dr. Sherrod Dolphin to follow at least 1 or 2 more days until I get a send she is stable and can come  out of stepdown.  Recommendations were made.  Questions were answered.  She expressed understanding & appreciation.  -Disposition: TBD      I reviewed last 24 h vitals and pain scores, last 48 h intake and output, last 24 h labs and trends, and last 24 h imaging results.  I have reviewed this patient's available data, including medical history, events of note, test results, etc as part of my evaluation.   A significant portion of that time was spent in counseling. Care during the described time interval was provided by me.  This care required moderate level of medical decision making.  05/13/2024    Subjective: (Chief complaint)  No major events.  Feels very dry in the mouth and wanting to drink.  Less sore.  Nursing just outside room.  Did have bowel movements.  Objective:  Vital signs:  Vitals:   05/13/24 0300 05/13/24 0400 05/13/24 0500 05/13/24 0600  BP: (!) 103/46 (!) 107/51 (!) 127/44 (!) 93/44  Pulse: 95 (!) 106 (!) 117 94  Resp: 20 (!) 22 (!) 24 20  Temp:  98.9 F (37.2 C)    TempSrc:  Axillary    SpO2: 97% 96% 96% 96%  Weight:   58.9 kg   Height:        Last BM Date : 05/09/24  Intake/Output   Yesterday:  06/02 0701 - 06/03 0700 In: 575.2 [P.O.:240; I.V.:335.2] Out: 1323 [Urine:400; Emesis/NG output:900; Drains:23] This shift:  No intake/output data recorded.  Bowel function:  Flatus: No  BM:  No  Drain: Serosanguinous volume.   Physical Exam:  General: Pt sleeping to awaken and mild discomfort but no major distress  Eyes: PERRL, normal EOM.  Sclera clear.  No icterus Neuro: CN II-XII intact w/o focal sensory/motor deficits. Lymph: No head/neck/groin lymphadenopathy Psych:  No delerium/psychosis/paranoia.  Oriented x 4 HENT: Normocephalic, Mucus membranes moist.  No thrush Neck: Supple, No tracheal deviation.  No obvious thyromegaly Chest: No pain to chest wall compression.  Good respiratory excursion.  No audible wheezing CV:  Pulses  intact.  Regular rhythm.  No major extremity edema MS: Normal AROM mjr joints.  No obvious deformity  Abdomen:  Soft.  Moderately distended.  Nontender.   Old ostomy incision with normal healing ridge.  No evidence of peritonitis.  No incarcerated hernias.  Ext:   No deformity.  No mjr edema.  No cyanosis Skin: No petechiae / purpurea.  No major sores.  Warm and dry    Results:   Cultures: Recent Results (from the past 720 hours)  Urine Culture     Status: Abnormal   Collection Time: 05/09/24  8:30 PM   Specimen: Urine, Clean Catch  Result Value Ref Range Status   Specimen Description   Final    URINE, CLEAN CATCH Performed at [facility], [address], [place], [place] [address]    Special Requests   Final    NONE Performed at [facility], [address] [place], [place] [address]    Culture (A)  Final    <10,000 COLONIES/mL INSIGNIFICANT GROWTH Performed at [facility], [address] [place], [place] [address]    Report Status 05/11/2024 FINAL  Final    Labs: Results for orders placed or performed during the  hospital encounter of 05/08/24 (from the past 48 hours)  CBC     Status: Abnormal   Collection Time: 05/11/24  4:44 PM  Result Value Ref Range   WBC 11.0 (H) 4.0 - 10.5 K/uL   RBC 3.57 (L) 3.87 - 5.11 MIL/uL   Hemoglobin 11.1 (L) 12.0 - 15.0 g/dL   HCT 40.9 (L) 81.1 - 91.4 %   MCV 95.0 80.0 - 100.0 fL   MCH 31.1 26.0 - 34.0 pg   MCHC 32.7 30.0 - 36.0 g/dL   RDW 78.2 95.6 - 21.3 %   Platelets 368 150 - 400 K/uL   nRBC 0.0 0.0 - 0.2 %    Comment: Performed at [facility], [address], [place], [place] [address]  CBC     Status: Abnormal   Collection Time: 05/12/24  2:47 AM  Result Value Ref Range   WBC 14.7 (H) 4.0 - 10.5 K/uL   RBC 3.49 (L) 3.87 - 5.11 MIL/uL   Hemoglobin 11.0 (L) 12.0 - 15.0 g/dL   HCT 84.6 (L) 96.2 - 95.2 %   MCV 96.0 80.0 - 100.0 fL   MCH 31.5 26.0 - 34.0 pg    MCHC 32.8 30.0 - 36.0 g/dL   RDW 84.1 32.4 - 40.1 %   Platelets 353 150 - 400 K/uL   nRBC 0.1 0.0 - 0.2 %    Comment: Performed at [facility], [address] [place], [place] [address]  Basic metabolic panel     Status: Abnormal   Collection Time: 05/12/24  2:47 AM  Result Value Ref Range   Sodium 133 (L) 135 - 145 mmol/L   Potassium 4.3 3.5 - 5.1 mmol/L   Chloride 104 98 - 111 mmol/L   CO2 20 (L) 22 - 32 mmol/L   Glucose, Bld 124 (H) 70 - 99 mg/dL    Comment: Glucose reference range applies only to samples taken after fasting for at least 8 hours.   BUN 47 (H) 8 - 23 mg/dL   Creatinine, Ser 3.66 (H) 0.44 - 1.00 mg/dL   Calcium  8.9 8.9 - 10.3 mg/dL   GFR, Estimated 52 (L) >60 mL/min    Comment: (NOTE) Calculated using the CKD-EPI Creatinine Equation (2021)    Anion gap 9 5 - 15    Comment: Performed at [facility], [address] [place], [place] [address]  Magnesium      Status: None   Collection Time: 05/12/24  2:47 AM  Result Value Ref Range   Magnesium  2.2 1.7 - 2.4 mg/dL    Comment: Performed at [facility], [address] [place], [place] [address]  Phosphorus     Status: None   Collection Time: 05/12/24  2:47 AM  Result Value Ref Range   Phosphorus 3.1 2.5 - 4.6 mg/dL    Comment: Performed at [facility], [address], [place], [place] [address]  Glucose, capillary     Status: Abnormal   Collection Time: 05/12/24 12:24 PM  Result Value Ref Range   Glucose-Capillary 116 (H) 70 - 99 mg/dL    Comment: Glucose reference range applies only to samples taken after fasting for at least 8 hours.  Glucose, capillary     Status: Abnormal   Collection Time: 05/12/24  5:42 PM  Result Value Ref Range   Glucose-Capillary 108 (H) 70 - 99 mg/dL    Comment: Glucose reference range applies only to samples taken after fasting for  at least 8 hours.   Comment 1 Notify RN   Glucose, capillary      Status: Abnormal   Collection Time: 05/13/24 12:08 AM  Result Value Ref Range   Glucose-Capillary 126 (H) 70 - 99 mg/dL    Comment: Glucose reference range applies only to samples taken after fasting for at least 8 hours.   Comment 1 Notify RN    Comment 2 Document in Chart   CBC     Status: Abnormal   Collection Time: 05/13/24  5:21 AM  Result Value Ref Range   WBC 15.1 (H) 4.0 - 10.5 K/uL   RBC 2.80 (L) 3.87 - 5.11 MIL/uL   Hemoglobin 8.7 (L) 12.0 - 15.0 g/dL   HCT 30.8 (L) 65.7 - 84.6 %   MCV 97.9 80.0 - 100.0 fL   MCH 31.1 26.0 - 34.0 pg   MCHC 31.8 30.0 - 36.0 g/dL   RDW 96.2 95.2 - 84.1 %   Platelets 360 150 - 400 K/uL   nRBC 0.3 (H) 0.0 - 0.2 %    Comment: Performed at [facility], [address], [place], [place] [address]  Comprehensive metabolic panel     Status: Abnormal   Collection Time: 05/13/24  5:21 AM  Result Value Ref Range   Sodium 135 135 - 145 mmol/L   Potassium 3.7 3.5 - 5.1 mmol/L   Chloride 102 98 - 111 mmol/L   CO2 26 22 - 32 mmol/L   Glucose, Bld 140 (H) 70 - 99 mg/dL    Comment: Glucose reference range applies only to samples taken after fasting for at least 8 hours.   BUN 47 (H) 8 - 23 mg/dL   Creatinine, Ser 1.02 0.44 - 1.00 mg/dL   Calcium  8.7 (L) 8.9 - 10.3 mg/dL   Total Protein 5.6 (L) 6.5 - 8.1 g/dL   Albumin  2.1 (L) 3.5 - 5.0 g/dL   AST 33 15 - 41 U/L   ALT 25 0 - 44 U/L   Alkaline Phosphatase 56 38 - 126 U/L   Total Bilirubin 0.7 0.0 - 1.2 mg/dL   GFR, Estimated >72 >53 mL/min    Comment: (NOTE) Calculated using the CKD-EPI Creatinine Equation (2021)    Anion gap 7 5 - 15    Comment: Performed at [facility], [address], [place], [place] [address]  Magnesium      Status: None   Collection Time: 05/13/24  5:21 AM  Result Value Ref Range   Magnesium  2.2 1.7 - 2.4 mg/dL    Comment: Performed at [facility], [address] [place], [place] [address]  Phosphorus     Status:  Abnormal   Collection Time: 05/13/24  5:21 AM  Result Value Ref Range   Phosphorus 2.2 (L) 2.5 - 4.6 mg/dL    Comment: Performed at [facility], [address] [place], [place] [address]  Triglycerides     Status: None   Collection Time: 05/13/24  5:21 AM  Result Value Ref Range   Triglycerides 119 <150 mg/dL    Comment: Performed at [facility], [address] [place], [place] [address]  Glucose, capillary     Status: Abnormal   Collection Time: 05/13/24  6:25 AM  Result Value Ref Range   Glucose-Capillary 142 (H) 70 - 99 mg/dL    Comment: Glucose reference range applies only to samples taken after fasting for at least 8 hours.   Comment 1  Notify RN    Comment 2 Document in Chart     Imaging / Studies: DG CHEST PORT 1 VIEW Result Date: 05/12/2024 CLINICAL DATA:  [id number] S/P PICC central line placement [id number] EXAM: PORTABLE CHEST - 1 VIEW COMPARISON:  May 11, 2024 FINDINGS: Right PICC terminates lower SVC. Esophagogastric tube extends below the level of the diaphragms with the distal tip outside the field of view. However, the last side hole appears in the region of the stomach. No focal airspace consolidation, pleural effusion, or pneumothorax. No cardiomegaly. IMPRESSION: 1. Right PICC terminates lower SVC. 2. Esophagogastric tube extends below the level of the diaphragm with the distal tip outside the field of view. However, the last sidehole appears in the region of the stomach. Electronically Signed   By: Rance Burrows M.D.   On: 05/12/2024 11:59   DG Abd 1 View Result Date: 05/12/2024 CLINICAL DATA:  NG tube placement EXAM: ABDOMEN - 1 VIEW COMPARISON:  October 26, 2023 FINDINGS: the tip of the nasogastric tube is located within the antrum stomach in good position. IVC filter in place IMPRESSION: Nasogastric tube in good position. Electronically Signed   By: Fredrich Jefferson M.D.   On: 05/12/2024 08:53   US  EKG SITE RITE Result Date:  05/12/2024 If Site Rite image not attached, placement could not be confirmed due to current cardiac rhythm.  DG CHEST PORT 1 VIEW Result Date: 05/11/2024 EXAM: 1 VIEW XRAY OF THE CHEST 05/11/2024 06:11:00 PM COMPARISON: 05/09/2024 CLINICAL HISTORY: Dyspnea, had bleeding from rectum earlier today and became unresponsive. FINDINGS: LUNGS AND PLEURA: No focal pulmonary opacity. No pulmonary edema. No pleural effusion. No pneumothorax. HEART AND MEDIASTINUM: No acute abnormality of the cardiac and mediastinal silhouettes. BONES AND SOFT TISSUES: No acute osseous abnormality. IMPRESSION: 1. No acute process. Electronically signed by: Zadie Herter MD 05/11/2024 07:06 PM EDT RP Workstation: [id number]    Medications / Allergies: per chart  Antibiotics: Anti-infectives (From admission, onward)    Start     Dose/Rate Route Frequency Ordered Stop   05/09/24 0900  erythromycin  250 mg in sodium chloride  0.9 % 100 mL IVPB        250 mg 100 mL/hr over 60 Minutes Intravenous Every 8 hours 05/09/24 0732 05/11/24 0044   05/08/24 2200  cefoTEtan  (CEFOTAN ) 2 g in sodium chloride  0.9 % 100 mL IVPB        2 g 200 mL/hr over 30 Minutes Intravenous Every 12 hours 05/08/24 1759 05/09/24 0749   05/08/24 1400  neomycin  (MYCIFRADIN ) tablet 1,000 mg  Status:  Discontinued       Placed in "And" Linked Group   1,000 mg Oral 3 times per day 05/08/24 1119 05/08/24 1120   05/08/24 1400  metroNIDAZOLE  (FLAGYL ) tablet 1,000 mg  Status:  Discontinued       Placed in "And" Linked Group   1,000 mg Oral 3 times per day 05/08/24 1119 05/08/24 1120   05/08/24 1130  cefoTEtan  (CEFOTAN ) 2 g in sodium chloride  0.9 % 100 mL IVPB        2 g 200 mL/hr over 30 Minutes Intravenous On call to O.R. 05/08/24 1119 05/09/24 0749         Note: Portions of this report may have been transcribed using voice recognition software. Every effort was made to ensure accuracy; however, inadvertent computerized transcription errors may be  present.   Any transcriptional errors that result from this process are unintentional.    Eddye Goodie, MD, FACS, MASCRS Esophageal,  Gastrointestinal & Colorectal Surgery Robotic and Minimally Invasive Surgery  [facility] [address], [address] [place], [place] [address] [phone] Fax [phone] Main  CONTACT INFORMATION: Weekday (9AM-5PM): Call [facility] main office at [phone] Weeknight (5PM-9AM) or Weekend/Holiday: Check EPIC "Web Links" tab & use "AMION" (password " [email or web address]") for General Surgery [facility] coverage  Please, DO NOT use SecureChat  (it is not reliable communication to reach operating surgeons & will lead to a delay in care).   Epic staff messaging available for outptient concerns needing 1-2 business day response.      05/13/2024  7:08 AM

## 2024-05-13 NOTE — Plan of Care (Signed)
   Problem: Education: Goal: Understanding of discharge needs will improve Outcome: Progressing Goal: Verbalization of understanding of the causes of altered bowel function will improve Outcome: Progressing   Problem: Activity: Goal: Ability to tolerate increased activity will improve Outcome: Progressing   Problem: Bowel/Gastric: Goal: Gastrointestinal status for postoperative course will improve Outcome: Progressing   Problem: Health Behavior/Discharge Planning: Goal: Identification of community resources to assist with postoperative recovery needs will improve Outcome: Progressing   Problem: Nutritional: Goal: Will attain and maintain optimal nutritional status will improve Outcome: Progressing   Problem: Clinical Measurements: Goal: Postoperative complications will be avoided or minimized Outcome: Progressing   Problem: Respiratory: Goal: Respiratory status will improve Outcome: Progressing   Problem: Skin Integrity: Goal: Will show signs of wound healing Outcome: Progressing   Problem: Education: Goal: Knowledge of General Education information will improve Description: Including pain rating scale, medication(s)/side effects and non-pharmacologic comfort measures Outcome: Progressing   Problem: Health Behavior/Discharge Planning: Goal: Ability to manage health-related needs will improve Outcome: Progressing   Problem: Clinical Measurements: Goal: Ability to maintain clinical measurements within normal limits will improve Outcome: Progressing Goal: Will remain free from infection Outcome: Progressing Goal: Diagnostic test results will improve Outcome: Progressing Goal: Respiratory complications will improve Outcome: Progressing Goal: Cardiovascular complication will be avoided Outcome: Progressing   Problem: Activity: Goal: Risk for activity intolerance will decrease Outcome: Progressing   Problem: Nutrition: Goal: Adequate nutrition will be  maintained Outcome: Progressing   Problem: Coping: Goal: Level of anxiety will decrease Outcome: Progressing   Problem: Elimination: Goal: Will not experience complications related to bowel motility Outcome: Progressing Goal: Will not experience complications related to urinary retention Outcome: Progressing   Problem: Pain Managment: Goal: General experience of comfort will improve and/or be controlled Outcome: Progressing   Problem: Safety: Goal: Ability to remain free from injury will improve Outcome: Progressing   Problem: Skin Integrity: Goal: Risk for impaired skin integrity will decrease Outcome: Progressing   Problem: Education: Goal: Ability to describe self-care measures that may prevent or decrease complications (Diabetes Survival Skills Education) will improve Outcome: Progressing Goal: Individualized Educational Video(s) Outcome: Progressing   Problem: Coping: Goal: Ability to adjust to condition or change in health will improve Outcome: Progressing   Problem: Fluid Volume: Goal: Ability to maintain a balanced intake and output will improve Outcome: Progressing   Problem: Health Behavior/Discharge Planning: Goal: Ability to identify and utilize available resources and services will improve Outcome: Progressing Goal: Ability to manage health-related needs will improve Outcome: Progressing   Problem: Metabolic: Goal: Ability to maintain appropriate glucose levels will improve Outcome: Progressing   Problem: Nutritional: Goal: Maintenance of adequate nutrition will improve Outcome: Progressing Goal: Progress toward achieving an optimal weight will improve Outcome: Progressing   Problem: Skin Integrity: Goal: Risk for impaired skin integrity will decrease Outcome: Progressing   Problem: Tissue Perfusion: Goal: Adequacy of tissue perfusion will improve Outcome: Progressing

## 2024-05-14 ENCOUNTER — Ambulatory Visit (HOSPITAL_COMMUNITY)

## 2024-05-14 DIAGNOSIS — E43 Unspecified severe protein-calorie malnutrition: Secondary | ICD-10-CM | POA: Diagnosis not present

## 2024-05-14 DIAGNOSIS — K5790 Diverticulosis of intestine, part unspecified, without perforation or abscess without bleeding: Secondary | ICD-10-CM | POA: Diagnosis not present

## 2024-05-14 LAB — BASIC METABOLIC PANEL WITH GFR
Anion gap: 10 (ref 5–15)
BUN: 34 mg/dL — ABNORMAL HIGH (ref 8–23)
CO2: 25 mmol/L (ref 22–32)
Calcium: 8.2 mg/dL — ABNORMAL LOW (ref 8.9–10.3)
Chloride: 100 mmol/L (ref 98–111)
Creatinine, Ser: 0.78 mg/dL (ref 0.44–1.00)
GFR, Estimated: >60 mL/min (ref >60)
Glucose, Bld: 142 mg/dL — ABNORMAL HIGH (ref 70–99)
Potassium: 3.5 mmol/L (ref 3.5–5.1)
Sodium: 135 mmol/L (ref 135–145)

## 2024-05-14 LAB — CBC
HCT: 28.2 % — ABNORMAL LOW (ref 36.0–46.0)
Hemoglobin: 8.9 g/dL — ABNORMAL LOW (ref 12.0–15.0)
MCH: 30.9 pg (ref 26.0–34.0)
MCHC: 31.6 g/dL (ref 30.0–36.0)
MCV: 97.9 fL (ref 80.0–100.0)
Platelets: 401 10*3/uL — ABNORMAL HIGH (ref 150–400)
RBC: 2.88 MIL/uL — ABNORMAL LOW (ref 3.87–5.11)
RDW: 14.2 % (ref 11.5–15.5)
WBC: 16 10*3/uL — ABNORMAL HIGH (ref 4.0–10.5)
nRBC: 0.3 % — ABNORMAL HIGH (ref 0.0–0.2)

## 2024-05-14 LAB — MAGNESIUM: Magnesium: 2 mg/dL (ref 1.7–2.4)

## 2024-05-14 LAB — GLUCOSE, CAPILLARY
Glucose-Capillary: 144 mg/dL — ABNORMAL HIGH (ref 70–99)
Glucose-Capillary: 150 mg/dL — ABNORMAL HIGH (ref 70–99)
Glucose-Capillary: 175 mg/dL — ABNORMAL HIGH (ref 70–99)

## 2024-05-14 LAB — PHOSPHORUS: Phosphorus: 2.6 mg/dL (ref 2.5–4.6)

## 2024-05-14 MED ORDER — POTASSIUM PHOSPHATES 15 MMOLE/5ML IV SOLN
15.0000 mmol | Freq: Once | INTRAVENOUS | Status: AC
  Administered 2024-05-14: 15 mmol via INTRAVENOUS
  Filled 2024-05-14: qty 5

## 2024-05-14 MED ORDER — ADULT MULTIVITAMIN W/MINERALS CH
1.0000 | ORAL_TABLET | Freq: Every day | ORAL | Status: DC
  Administered 2024-05-14 – 2024-05-17 (×4): 1 via ORAL
  Filled 2024-05-14 (×6): qty 1

## 2024-05-14 MED ORDER — METHOCARBAMOL 1000 MG/10ML IJ SOLN: 1000.0000 mg | Freq: Four times a day (QID) | INTRAMUSCULAR | Status: DC | PRN

## 2024-05-14 MED ORDER — ACETAMINOPHEN 500 MG PO TABS
500.0000 mg | ORAL_TABLET | Freq: Three times a day (TID) | ORAL | Status: DC
  Administered 2024-05-14 – 2024-06-03 (×40): 500 mg via ORAL
  Filled 2024-05-14 (×49): qty 1

## 2024-05-14 MED ORDER — BISMUTH SUBSALICYLATE 262 MG/15ML PO SUSP
30.0000 mL | Freq: Two times a day (BID) | ORAL | Status: DC
  Administered 2024-05-14 – 2024-05-15 (×4): 30 mL via ORAL
  Filled 2024-05-14: qty 236

## 2024-05-14 MED ORDER — INSULIN ASPART 100 UNIT/ML IJ SOLN
0.0000 [IU] | Freq: Three times a day (TID) | INTRAMUSCULAR | Status: DC
  Administered 2024-05-14: 1 [IU] via SUBCUTANEOUS
  Administered 2024-05-14: 2 [IU] via SUBCUTANEOUS
  Administered 2024-05-15 – 2024-05-16 (×4): 1 [IU] via SUBCUTANEOUS

## 2024-05-14 MED ORDER — ACETAMINOPHEN 500 MG PO TABS: 1000.0000 mg | ORAL_TABLET | Freq: Four times a day (QID) | ORAL | Status: DC

## 2024-05-14 MED ORDER — TRAVASOL 10 % IV SOLN
INTRAVENOUS | Status: AC
  Filled 2024-05-14: qty 600

## 2024-05-14 MED ORDER — LACTATED RINGERS IV BOLUS
1000.0000 mL | Freq: Three times a day (TID) | INTRAVENOUS | Status: AC | PRN
  Administered 2024-05-16: 1000 mL via INTRAVENOUS

## 2024-05-14 MED ORDER — FENTANYL CITRATE PF 50 MCG/ML IJ SOSY
25.0000 ug | PREFILLED_SYRINGE | INTRAMUSCULAR | Status: DC | PRN
  Administered 2024-05-15: 25 ug via INTRAVENOUS
  Administered 2024-05-16 – 2024-05-18 (×2): 50 ug via INTRAVENOUS
  Filled 2024-05-14 (×3): qty 1

## 2024-05-14 MED ORDER — APIXABAN 5 MG PO TABS
5.0000 mg | ORAL_TABLET | Freq: Two times a day (BID) | ORAL | Status: DC
  Administered 2024-05-14 – 2024-05-15 (×4): 5 mg via ORAL
  Filled 2024-05-14 (×4): qty 1

## 2024-05-14 MED ORDER — MAGIC MOUTHWASH: 15.0000 mL | Freq: Four times a day (QID) | ORAL | Status: AC | PRN

## 2024-05-14 MED ORDER — THIAMINE MONONITRATE 100 MG PO TABS
100.0000 mg | ORAL_TABLET | Freq: Every day | ORAL | Status: AC
  Administered 2024-05-14 – 2024-05-16 (×3): 100 mg via ORAL
  Filled 2024-05-14 (×3): qty 1

## 2024-05-14 MED ORDER — VITAMIN B-12 1000 MCG PO TABS
1000.0000 ug | ORAL_TABLET | Freq: Every day | ORAL | Status: DC
  Administered 2024-05-14 – 2024-06-02 (×18): 1000 ug via ORAL
  Filled 2024-05-14 (×18): qty 1

## 2024-05-14 MED ORDER — HYDROCODONE-ACETAMINOPHEN 10-325 MG PO TABS
0.5000 | ORAL_TABLET | ORAL | Status: DC | PRN
  Administered 2024-05-14 – 2024-05-16 (×2): 1 via ORAL
  Filled 2024-05-14 (×2): qty 1

## 2024-05-14 MED ORDER — POTASSIUM CHLORIDE CRYS ER 20 MEQ PO TBCR
40.0000 meq | EXTENDED_RELEASE_TABLET | Freq: Every day | ORAL | Status: DC
  Administered 2024-05-14 – 2024-05-15 (×2): 40 meq via ORAL
  Filled 2024-05-14 (×2): qty 2

## 2024-05-14 MED ORDER — CALCIUM POLYCARBOPHIL 625 MG PO TABS
625.0000 mg | ORAL_TABLET | Freq: Two times a day (BID) | ORAL | Status: DC
  Administered 2024-05-14 – 2024-05-15 (×4): 625 mg via ORAL
  Filled 2024-05-14 (×5): qty 1

## 2024-05-14 MED ORDER — VITAMIN C 500 MG PO TABS
250.0000 mg | ORAL_TABLET | Freq: Every day | ORAL | Status: DC
  Administered 2024-05-14 – 2024-05-29 (×13): 250 mg via ORAL
  Filled 2024-05-14 (×14): qty 1

## 2024-05-14 NOTE — Progress Notes (Signed)
 Calorie Count note  48 hour calorie count ordered per surgery to start 6/4 at breakfast and run through 6/5 tomorrow at dinner.  - Please document % intake of all foods, drinks, and nutrition supplements patient consumes on meal tickets and place in envelope on patient's door.  - If patient skips/refuses meals please document 0% for that meal.   -Envelope placed on door.  Arna Better, MS, RD, LDN Inpatient Clinical Dietitian Contact via Secure chat

## 2024-05-14 NOTE — Progress Notes (Signed)
 05/14/2024  Elizabeth Odom [id number] 24-Jun-1941  CARE TEAM: PCP: Anthon Kins, MD  Outpatient Care Team: Patient Care Team: Anthon Kins, MD as PCP - General (Internal Medicine) Cody Das, MD as PCP - Cardiology (Cardiology) Lajuan Pila, MD as Consulting Physician (Gastroenterology) Shela Derby, MD as Consulting Physician (General Surgery) Glory Larsen, MD (Neurology) Lahoma Pigg, MD as Consulting Physician (Urology) Sandee Crook Margart Shears, MD as Consulting Physician (Cardiology) Candyce Champagne, MD as Consulting Physician (Colon and Rectal Surgery)  Inpatient Treatment Team: Treatment Team:  Candyce Champagne, MD Ruel Cotta, Heinz Llano, MD L[facility], Rounding, MD Marrianne Six, RN Verlin Glory, MD Celestina Colas, NT Dock Freer, LPN Creta Dolin, RN   Problem List:   Principal Problem:   Diverticular disease Active Problems:   Protein-calorie malnutrition, severe   05/08/2024  POST-OPERATIVE DIAGNOSIS:   COLOSTOMY FOR RESECTION, DESIRE FOR OSTOMY TAKEDOWN RECTAL STRICTURE HISTORY OF DIVERTICULITIS WITH PERFORATION & ABSCESS   PROCEDURE:   -ROBOTIC RECTOSIGMOID RESECTION (LAR) -TAKEDOWN OF END COLOSTOMY WITH ANASTOMOSIS -RESECTION OF SMALL INTESTINE WITH ANASTOMOSIS -SMALL BOWEL REPAIR -LYSIS OF ADHESIONS x 115 MINUTES (66% OF CASE),  -INTRAOPERATIVE ASSESSMENT OF TISSUE VASCULAR PERFUSION USING ICG (indocyanine green ) IMMUNOFLUORESCENCE,  -TRANSVERSUS ABDOMINIS PLANE (TAP) BLOCK - BILATERAL -FLEXIBLE SIGMOIDOSCOPY   SURGEON:  Eddye Goodie, MD  OR FINDINGS:  Very dense adhesions of small bowel especially to pelvis and retroperitoneum.  Extremely concrete dense adhesions with friable tissue near her old prior small bowel anastomosis and distal jejunum.  Required small bowel repair and required small bowel resection including old anastomosis given severe tissues.  Patient had significant fibrotic stricturing of  rectal stump at the mid/distal rectal junction.  Could not be released up.  Ended up doing resection of rectal stump to mid/distal rectal junction.  No obvious metastatic disease on visceral parietal peritoneum or liver.   It is a 29mm EEA anastomosis ( distal descending colon  connected to mid/distal rectal junction.)  It rests 7 cm from the anal verge by rigid proctoscopy.  Assessment [facility] Stay = 6 days) 6 Days Post-Op    Ileus appears to be resolving.    Plan: I removed the NG tube.  Dysphagia 1 or full liquid diet today.  Start calorie counts and see if we can wean off TPN gradually.  TPN through PICC line.  Looks like she is having problems urinating.  Stop bladder scans.  Already got I&O cath once.  Looks like she needs another I&O.  Per my my orders, place a Foley if I&O cath needed a second time over concerns of urinary tension.  This would be the second time so lets put a Foley in.  I put in orders.  I removed the surgical drain  - serosanguineous to low volume.  Post ileus diarrhea.  No rectal tubes or catching bags in the area.  Bedside commode.  Will add fiber and Pepto-Bismol to gently thicken and slow down.  Avoid Imodium unless she is on solid diet and still struggling.  Hopefully not too likely.  We will see.  Atrial fibrillation postop.  History of a prior event.    Cardiology following.  Initially holding off but wishing to have the patient get full anticoagulation.  I am okay with this.  Sent a message to cardiology this morning to clarify it further.  Reached Dr. Emmette Harms - she is aware and will manage.  Pain control.  Challenge in this patient  Too sedated with morphine  &  Dilaudid .    Apparently fentanyl  inadequate.  Will increase the dose for now.    Restart Tylenol .    Add PO muscle relaxant.    See if hydrocodone  will work (tramadol  too weak and oxycodone  too strong)  Rehydration as tolerated.  Stop extra IV fluids and keep on the dry side.  -monitor  electrolytes & replace as needed.  Potassium borderline.  Add oral supplementation. Keep K>4, Mg>2, Phos>3  -VTE prophylaxis- SCDs.  Anticoagulation as appropriate  -mobilize as tolerated to help recovery.  Enlist therapies in moderate/high risk patients as appropriate  I think she is stable enough to go back up to the floor.  Reasonable placed on telemetry to keep an eye on cardiac rate.  Cardiology and medicine help appreciated.  I updated the patient's status to the patient and nurse.  Discussed with [facility] internal medicine.  Asked Dr. Sherrod Dolphin to follow at least 1 or 2 more days until I get a send she is stable and can come out of stepdown.  Recommendations were made.  Questions were answered.  She expressed understanding & appreciation.  -Disposition: TBD      I reviewed last 24 h vitals and pain scores, last 48 h intake and output, last 24 h labs and trends, and last 24 h imaging results.  I have reviewed this patient's available data, including medical history, events of note, test results, etc as part of my evaluation.   A significant portion of that time was spent in counseling. Care during the described time interval was provided by me.  This care required moderate level of medical decision making.  05/14/2024    Subjective: (Chief complaint)  No major events.  Feels very dry in the mouth and wanting to drink.  Less sore.  Nursing just outside room.  Did have bowel movements.  Objective:  Vital signs:  Vitals:   05/13/24 1136 05/13/24 1933 05/14/24 0139 05/14/24 0418  BP: (!) 109/47 (!) 110/59  117/60  Pulse: (!) 103 (!) 107    Resp:  18  18  Temp:  98.5 F (36.9 C)  98.4 F (36.9 C)  TempSrc:      SpO2: 97% 96%  97%  Weight:   63.4 kg   Height:        Last BM Date : 05/13/24  Intake/Output   Yesterday:  06/03 0701 - 06/04 0700 In: 998.1 [P.O.:50; I.V.:618.1; NG/GT:330] Out: 603.5 [Urine:500; Emesis/NG output:100; Drains:3.5] This shift:  No  intake/output data recorded.  Bowel function:  Flatus: YES  BM:  YES  Drain: Serosanguinous -I removed.     Physical Exam:  General: Pt sleeping to awaken and mild discomfort but no major distress  Eyes: PERRL, normal EOM.  Sclera clear.  No icterus Neuro: CN II-XII intact w/o focal sensory/motor deficits. Lymph: No head/neck/groin lymphadenopathy Psych:  No delerium/psychosis/paranoia.  Oriented x 4 HENT: Normocephalic, Mucus membranes moist.  No thrush Neck: Supple, No tracheal deviation.  No obvious thyromegaly Chest: No pain to chest wall compression.  Good respiratory excursion.  No audible wheezing CV:  Pulses intact.  Regular rhythm.  No major extremity edema MS: Normal AROM mjr joints.  No obvious deformity  Abdomen:  Soft.  Moderately distended.  Nontender.   Old ostomy incision with normal healing ridge.  No evidence of peritonitis.  No incarcerated hernias.  Ext:   No deformity.  No mjr edema.  No cyanosis Skin: No petechiae / purpurea.  No major sores.  Warm and dry  Results:   Cultures: Recent Results (from the past 720 hours)  Urine Culture     Status: Abnormal   Collection Time: 05/09/24  8:30 PM   Specimen: Urine, Clean Catch  Result Value Ref Range Status   Specimen Description   Final    URINE, CLEAN CATCH Performed at [facility], [address], [place], [place] [address]    Special Requests   Final    NONE Performed at [facility], [address] [place], [place] [address]    Culture (A)  Final    <10,000 COLONIES/mL INSIGNIFICANT GROWTH Performed at [facility], [address] [place], [place] [address]    Report Status 05/11/2024 FINAL  Final    Labs: Results for orders placed or performed during the hospital encounter of 05/08/24 (from the past 48 hours)  Glucose, capillary     Status: Abnormal   Collection Time: 05/12/24 12:24 PM  Result Value Ref Range   Glucose-Capillary 116 (H) 70  - 99 mg/dL    Comment: Glucose reference range applies only to samples taken after fasting for at least 8 hours.  Glucose, capillary     Status: Abnormal   Collection Time: 05/12/24  5:42 PM  Result Value Ref Range   Glucose-Capillary 108 (H) 70 - 99 mg/dL    Comment: Glucose reference range applies only to samples taken after fasting for at least 8 hours.   Comment 1 Notify RN   Glucose, capillary     Status: Abnormal   Collection Time: 05/13/24 12:08 AM  Result Value Ref Range   Glucose-Capillary 126 (H) 70 - 99 mg/dL    Comment: Glucose reference range applies only to samples taken after fasting for at least 8 hours.   Comment 1 Notify RN    Comment 2 Document in Chart   CBC     Status: Abnormal   Collection Time: 05/13/24  5:21 AM  Result Value Ref Range   WBC 15.1 (H) 4.0 - 10.5 K/uL   RBC 2.80 (L) 3.87 - 5.11 MIL/uL   Hemoglobin 8.7 (L) 12.0 - 15.0 g/dL   HCT 91.4 (L) 78.2 - 95.6 %   MCV 97.9 80.0 - 100.0 fL   MCH 31.1 26.0 - 34.0 pg   MCHC 31.8 30.0 - 36.0 g/dL   RDW 21.3 08.6 - 57.8 %   Platelets 360 150 - 400 K/uL   nRBC 0.3 (H) 0.0 - 0.2 %    Comment: Performed at [facility], [address], [place], [place] [address]  Comprehensive metabolic panel     Status: Abnormal   Collection Time: 05/13/24  5:21 AM  Result Value Ref Range   Sodium 135 135 - 145 mmol/L   Potassium 3.7 3.5 - 5.1 mmol/L   Chloride 102 98 - 111 mmol/L   CO2 26 22 - 32 mmol/L   Glucose, Bld 140 (H) 70 - 99 mg/dL    Comment: Glucose reference range applies only to samples taken after fasting for at least 8 hours.   BUN 47 (H) 8 - 23 mg/dL   Creatinine, Ser 9.52 0.44 - 1.00 mg/dL   Calcium  8.7 (L) 8.9 - 10.3 mg/dL   Total Protein 5.6 (L) 6.5 - 8.1 g/dL   Albumin  2.1 (L) 3.5 - 5.0 g/dL   AST 33 15 - 41 U/L   ALT 25 0 - 44 U/L   Alkaline Phosphatase 56 38 - 126 U/L   Total  Bilirubin 0.7 0.0 - 1.2 mg/dL   GFR, Estimated >95 >28 mL/min    Comment: (NOTE) Calculated using the  CKD-EPI Creatinine Equation (2021)    Anion gap 7 5 - 15    Comment: Performed at [facility], [address] [place], [place] [address]  Magnesium      Status: None   Collection Time: 05/13/24  5:21 AM  Result Value Ref Range   Magnesium  2.2 1.7 - 2.4 mg/dL    Comment: Performed at [facility], [address], [place], [place] [address]  Phosphorus     Status: Abnormal   Collection Time: 05/13/24  5:21 AM  Result Value Ref Range   Phosphorus 2.2 (L) 2.5 - 4.6 mg/dL    Comment: Performed at [facility], [address], [place], [place] [address]  Triglycerides     Status: None   Collection Time: 05/13/24  5:21 AM  Result Value Ref Range   Triglycerides 119 <150 mg/dL    Comment: Performed at [facility], [address], [place], [place] [address]  Glucose, capillary     Status: Abnormal   Collection Time: 05/13/24  6:25 AM  Result Value Ref Range   Glucose-Capillary 142 (H) 70 - 99 mg/dL    Comment: Glucose reference range applies only to samples taken after fasting for at least 8 hours.   Comment 1 Notify RN    Comment 2 Document in Chart   Glucose, capillary     Status: Abnormal   Collection Time: 05/13/24 12:09 PM  Result Value Ref Range   Glucose-Capillary 186 (H) 70 - 99 mg/dL    Comment: Glucose reference range applies only to samples taken after fasting for at least 8 hours.  CBC     Status: Abnormal   Collection Time: 05/13/24  3:43 PM  Result Value Ref Range   WBC 12.8 (H) 4.0 - 10.5 K/uL   RBC 2.95 (L) 3.87 - 5.11 MIL/uL   Hemoglobin 9.3 (L) 12.0 - 15.0 g/dL   HCT 47.4 (L) 25.9 - 56.3 %   MCV 101.0 (H) 80.0 - 100.0 fL   MCH 31.5 26.0 - 34.0 pg   MCHC 31.2 30.0 - 36.0 g/dL   RDW 87.5 64.3 - 32.9 %   Platelets 398 150 - 400 K/uL   nRBC 0.3 (H) 0.0 - 0.2 %    Comment: Performed at [facility], [address], [place], [place] [address]  Glucose, capillary     Status:  Abnormal   Collection Time: 05/13/24  6:08 PM  Result Value Ref Range   Glucose-Capillary 145 (H) 70 - 99 mg/dL    Comment: Glucose reference range applies only to samples taken after fasting for at least 8 hours.  Glucose, capillary     Status: Abnormal   Collection Time: 05/13/24 11:52 PM  Result Value Ref Range   Glucose-Capillary 152 (H) 70 - 99 mg/dL    Comment: Glucose reference range applies only to samples taken after fasting for at least 8 hours.  Basic metabolic panel     Status: Abnormal   Collection Time: 05/14/24  3:25 AM  Result Value Ref Range   Sodium 135 135 - 145 mmol/L   Potassium 3.5 3.5 - 5.1 mmol/L   Chloride 100 98 - 111 mmol/L   CO2 25 22 - 32 mmol/L   Glucose, Bld 142 (H) 70 - 99 mg/dL    Comment: Glucose reference range applies  only to samples taken after fasting for at least 8 hours.   BUN 34 (H) 8 - 23 mg/dL   Creatinine, Ser 1.61 0.44 - 1.00 mg/dL   Calcium  8.2 (L) 8.9 - 10.3 mg/dL   GFR, Estimated >09 >60 mL/min    Comment: (NOTE) Calculated using the CKD-EPI Creatinine Equation (2021)    Anion gap 10 5 - 15    Comment: Performed at [facility], [address] [place], [place] [address]  CBC     Status: Abnormal   Collection Time: 05/14/24  3:25 AM  Result Value Ref Range   WBC 16.0 (H) 4.0 - 10.5 K/uL   RBC 2.88 (L) 3.87 - 5.11 MIL/uL   Hemoglobin 8.9 (L) 12.0 - 15.0 g/dL   HCT 81.1 (L) 91.4 - 78.2 %   MCV 97.9 80.0 - 100.0 fL   MCH 30.9 26.0 - 34.0 pg   MCHC 31.6 30.0 - 36.0 g/dL   RDW 95.6 21.3 - 08.6 %   Platelets 401 (H) 150 - 400 K/uL   nRBC 0.3 (H) 0.0 - 0.2 %    Comment: Performed at [facility], [address], [place], [place] [address]  Magnesium      Status: None   Collection Time: 05/14/24  3:25 AM  Result Value Ref Range   Magnesium  2.0 1.7 - 2.4 mg/dL    Comment: Performed at [facility], [address] [place], [place] [address]  Phosphorus     Status: None    Collection Time: 05/14/24  3:25 AM  Result Value Ref Range   Phosphorus 2.6 2.5 - 4.6 mg/dL    Comment: Performed at [facility], [address], [place], [place] [address]  Glucose, capillary     Status: Abnormal   Collection Time: 05/14/24  5:27 AM  Result Value Ref Range   Glucose-Capillary 150 (H) 70 - 99 mg/dL    Comment: Glucose reference range applies only to samples taken after fasting for at least 8 hours.    Imaging / Studies: DG CHEST PORT 1 VIEW Result Date: 05/12/2024 CLINICAL DATA:  [id number] S/P PICC central line placement [id number] EXAM: PORTABLE CHEST - 1 VIEW COMPARISON:  May 11, 2024 FINDINGS: Right PICC terminates lower SVC. Esophagogastric tube extends below the level of the diaphragms with the distal tip outside the field of view. However, the last side hole appears in the region of the stomach. No focal airspace consolidation, pleural effusion, or pneumothorax. No cardiomegaly. IMPRESSION: 1. Right PICC terminates lower SVC. 2. Esophagogastric tube extends below the level of the diaphragm with the distal tip outside the field of view. However, the last sidehole appears in the region of the stomach. Electronically Signed   By: Rance Burrows M.D.   On: 05/12/2024 11:59   DG Abd 1 View Result Date: 05/12/2024 CLINICAL DATA:  NG tube placement EXAM: ABDOMEN - 1 VIEW COMPARISON:  October 26, 2023 FINDINGS: the tip of the nasogastric tube is located within the antrum stomach in good position. IVC filter in place IMPRESSION: Nasogastric tube in good position. Electronically Signed   By: Fredrich Jefferson M.D.   On: 05/12/2024 08:53    Medications / Allergies: per chart  Antibiotics: Anti-infectives (From admission, onward)    Start     Dose/Rate Route Frequency Ordered Stop   05/09/24 0900  erythromycin  250 mg in sodium chloride  0.9 % 100 mL IVPB        250 mg 100  mL/hr over 60 Minutes Intravenous Every 8 hours 05/09/24 0732 05/11/24 0044   05/08/24 2200   cefoTEtan  (CEFOTAN ) 2 g in sodium chloride  0.9 % 100 mL IVPB        2 g 200 mL/hr over 30 Minutes Intravenous Every 12 hours 05/08/24 1759 05/09/24 0749   05/08/24 1400  neomycin  (MYCIFRADIN ) tablet 1,000 mg  Status:  Discontinued       Placed in "And" Linked Group   1,000 mg Oral 3 times per day 05/08/24 1119 05/08/24 1120   05/08/24 1400  metroNIDAZOLE  (FLAGYL ) tablet 1,000 mg  Status:  Discontinued       Placed in "And" Linked Group   1,000 mg Oral 3 times per day 05/08/24 1119 05/08/24 1120   05/08/24 1130  cefoTEtan  (CEFOTAN ) 2 g in sodium chloride  0.9 % 100 mL IVPB        2 g 200 mL/hr over 30 Minutes Intravenous On call to O.R. 05/08/24 1119 05/09/24 0749         Note: Portions of this report may have been transcribed using voice recognition software. Every effort was made to ensure accuracy; however, inadvertent computerized transcription errors may be present.   Any transcriptional errors that result from this process are unintentional.    Eddye Goodie, MD, FACS, MASCRS Esophageal, Gastrointestinal & Colorectal Surgery Robotic and Minimally Invasive Surgery  [facility] [address] [place], [place] [address] [phone] Fax [phone] Main  CONTACT INFORMATION: Weekday (9AM-5PM): Call [facility] main office at [phone] Weeknight (5PM-9AM) or Weekend/Holiday: Check EPIC "Web Links" tab & use "AMION" (password " [email or web address]") for General Surgery [facility] coverage  Please, DO NOT use SecureChat  (it is not reliable communication to reach operating surgeons & will lead to a delay in care).   Epic staff messaging available for outptient concerns needing 1-2 business day response.      05/14/2024  7:44 AM

## 2024-05-14 NOTE — TOC Progression Note (Signed)
 Transition of Care [facility]) - Progression Note    Patient Details  Name: Elizabeth Odom MRN: [id number] Date of Birth: 09-Sep-1941  Transition of [facility]) CM/SW Contact  Amaryllis Junior, [place] Phone Number: 05/14/2024, 9:58 AM  Clinical Narrative:    Pt recommended for HHPT/OT. HHPT/OT setup through [facility]. [facility] HH information added to AVS.    Expected Discharge Plan: Home/Self Care Barriers to Discharge: Continued Medical Work up  Expected Discharge Plan and Services   Discharge Planning Services: CM Consult   Living arrangements for the past 2 months: Apartment                                       Social Determinants of Health (SDOH) Interventions SDOH Screenings   Food Insecurity: No Food Insecurity (05/09/2024)  Housing: Low Risk  (05/09/2024)  Transportation Needs: No Transportation Needs (05/09/2024)  Utilities: Not At Risk (05/09/2024)  Depression (PHQ2-9): Low Risk  (01/02/2024)  Social Connections: Unknown (05/09/2024)  Tobacco Use: Low Risk  (05/08/2024)    Readmission Risk Interventions    05/12/2024    2:23 PM 05/09/2024    2:46 PM 10/19/2023    6:13 PM  Readmission Risk Prevention Plan  Transportation Screening Complete Complete Complete  PCP or Specialist Appt within 5-7 Days   Complete  PCP or Specialist Appt within 3-5 Days Complete Complete   Home Care Screening   Complete  Medication Review (RN CM)   Complete  HRI or Home Care Consult Complete Complete   Social Work Consult for Recovery Care Planning/Counseling Complete Complete   Palliative Care Screening Not Applicable Not Applicable   Medication Review Oceanographer) Complete Complete

## 2024-05-14 NOTE — Progress Notes (Signed)
 Progress Note  Patient Name: Elizabeth Odom Date of Encounter: 05/14/2024  Primary Cardiologist: Cody Das, MD   Subjective   Patient seen and examined.  She was awake when I arrived.   Inpatient Medications    Scheduled Meds:  acetaminophen   500 mg Oral TID WC & HS   ascorbic acid   250 mg Oral Daily   aspirin  EC  81 mg Oral Daily   bismuth subsalicylate  30 mL Oral BID   Chlorhexidine  Gluconate Cloth  6 each Topical Daily   cyanocobalamin   1,000 mcg Oral Daily   feeding supplement  1 Container Oral TID BM   insulin  aspart  0-9 Units Subcutaneous Q6H   magic mouthwash  15 mL Oral QID   methocarbamol  (ROBAXIN ) injection  500 mg Intravenous Q8H   metoprolol  tartrate  12.5 mg Oral Q8H   multivitamin with minerals  1 tablet Oral Daily   pantoprazole  40 mg Oral Daily   polycarbophil  625 mg Oral BID   potassium chloride   40 mEq Oral Daily   sodium chloride  flush  10-40 mL Intracatheter Q12H   sodium chloride  flush  3 mL Intravenous Q12H   thiamine  (VITAMIN B1) injection  100 mg Intravenous Q24H   Continuous Infusions:  sodium chloride      lactated ringers      TPN ADULT (ION) 50 mL/hr at 05/14/24 0600   PRN Meds: sodium chloride , alum & mag hydroxide-simeth, diphenhydrAMINE  **OR** diphenhydrAMINE , fentaNYL  (SUBLIMAZE ) injection, hydrALAZINE , HYDROcodone -acetaminophen , lactated ringers , magic mouthwash, menthol -cetylpyridinium, methocarbamol  (ROBAXIN ) injection, naphazoline-glycerin , [DISCONTINUED] ondansetron  **OR** ondansetron  (ZOFRAN ) IV, mouth rinse, phenol, [DISCONTINUED] prochlorperazine  **OR** prochlorperazine , simethicone , sodium chloride , sodium chloride  flush, sodium chloride  flush   Vital Signs    Vitals:   05/13/24 1136 05/13/24 1933 05/14/24 0139 05/14/24 0418  BP: (!) 109/47 (!) 110/59  117/60  Pulse: (!) 103 (!) 107    Resp:  18  18  Temp:  98.5 F (36.9 C)  98.4 F (36.9 C)  TempSrc:      SpO2: 97% 96%  97%  Weight:   63.4 kg   Height:         Intake/Output Summary (Last 24 hours) at 05/14/2024 0946 Last data filed at 05/14/2024 0700 Gross per 24 hour  Intake 988.05 ml  Output 503.5 ml  Net 484.55 ml   Filed Weights   05/11/24 0404 05/13/24 0500 05/14/24 0139  Weight: 59.6 kg 58.9 kg 63.4 kg    Telemetry    Atrial fibrillation but rate controlled  - Personally Reviewed  ECG     - Personally Reviewed  Physical Exam   General: Comfortable Head: Atraumatic, normal size  Eyes: PEERLA, EOMI  Neck: Supple, normal JVD Cardiac: Normal S1, S2; RRR; no murmurs, rubs, or gallops Lungs: Clear to auscultation bilaterally Abd: Soft, nontender, no hepatomegaly  Ext: warm, no edema Musculoskeletal: No deformities, BUE and BLE strength normal and equal Skin: Warm and dry, no rashes   Neuro: Alert and oriented to person, place, time, and situation, CNII-XII grossly intact, no focal deficits  Psych: Normal mood and affect   Labs    Chemistry Recent Labs  Lab 05/12/24 0247 05/13/24 0521 05/14/24 0325  NA 133* 135 135  K 4.3 3.7 3.5  CL 104 102 100  CO2 20* 26 25  GLUCOSE 124* 140* 142*  BUN 47* 47* 34*  CREATININE 1.07* 0.75 0.78  CALCIUM  8.9 8.7* 8.2*  PROT  --  5.6*  --   ALBUMIN   --  2.1*  --  AST  --  33  --   ALT  --  25  --   ALKPHOS  --  56  --   BILITOT  --  0.7  --   GFRNONAA 52* >60 >60  ANIONGAP 9 7 10      Hematology Recent Labs  Lab 05/13/24 0521 05/13/24 1543 05/14/24 0325  WBC 15.1* 12.8* 16.0*  RBC 2.80* 2.95* 2.88*  HGB 8.7* 9.3* 8.9*  HCT 27.4* 29.8* 28.2*  MCV 97.9 101.0* 97.9  MCH 31.1 31.5 30.9  MCHC 31.8 31.2 31.6  RDW 14.0 14.2 14.2  PLT 360 398 401*    Cardiac EnzymesNo results for input(s): "TROPONINI" in the last 168 hours. No results for input(s): "TROPIPOC" in the last 168 hours.   BNPNo results for input(s): "BNP", "PROBNP" in the last 168 hours.   DDimer No results for input(s): "DDIMER" in the last 168 hours.   Radiology    DG CHEST PORT 1 VIEW Result Date:  05/12/2024 CLINICAL DATA:  [id number] S/P PICC central line placement [id number] EXAM: PORTABLE CHEST - 1 VIEW COMPARISON:  May 11, 2024 FINDINGS: Right PICC terminates lower SVC. Esophagogastric tube extends below the level of the diaphragms with the distal tip outside the field of view. However, the last side hole appears in the region of the stomach. No focal airspace consolidation, pleural effusion, or pneumothorax. No cardiomegaly. IMPRESSION: 1. Right PICC terminates lower SVC. 2. Esophagogastric tube extends below the level of the diaphragm with the distal tip outside the field of view. However, the last sidehole appears in the region of the stomach. Electronically Signed   By: Rance Burrows M.D.   On: 05/12/2024 11:59    Cardiac Studies   Echo   Patient Profile     83 y.o. female status post rectosigmoid resection takedown and now with post op atrial fibrillation and flutter  Assessment & Plan    Paroxysmal atrial fib and flutter - in atrial fibrillation this morning but is rate controlled intermittently.  Transitioned her to p.o. Lopressor  yesterday 12.5 mg every 8 hours.  As long as her heart rate does not exceed 120 bpm we will take a lenient approach.  Will stop heparin  drip and restart her Eliquis 5 mg twice daily.  AKI-this has resolved.  Syncpe - suspect vasovagal     For questions or updates, please contact [facility] Please consult [email or web address] for contact info under Cardiology/STEMI.      Signed, Dimas Scheck, DO  05/14/2024, 9:46 AM

## 2024-05-14 NOTE — Plan of Care (Signed)
  Problem: Education: Goal: Understanding of discharge needs will improve 05/14/2024 1916 by Dock Freer, LPN Outcome: Progressing 05/14/2024 1916 by Dock Freer, LPN Outcome: Progressing   Problem: Activity: Goal: Ability to tolerate increased activity will improve 05/14/2024 1916 by Dock Freer, LPN Outcome: Progressing 05/14/2024 1916 by Dock Freer, LPN Outcome: Progressing   Problem: Bowel/Gastric: Goal: Gastrointestinal status for postoperative course will improve Outcome: Progressing   Problem: Health Behavior/Discharge Planning: Goal: Identification of community resources to assist with postoperative recovery needs will improve Outcome: Progressing   Problem: Nutritional: Goal: Will attain and maintain optimal nutritional status will improve 05/14/2024 1916 by Dock Freer, LPN Outcome: Progressing 05/14/2024 1916 by Dock Freer, LPN Outcome: Progressing   Problem: Clinical Measurements: Goal: Postoperative complications will be avoided or minimized Outcome: Progressing   Problem: Skin Integrity: Goal: Will show signs of wound healing Outcome: Progressing   Problem: Education: Goal: Knowledge of General Education information will improve Description: Including pain rating scale, medication(s)/side effects and non-pharmacologic comfort measures 05/14/2024 1916 by Dock Freer, LPN Outcome: Progressing 05/14/2024 1916 by Dock Freer, LPN Outcome: Progressing   Problem: Health Behavior/Discharge Planning: Goal: Ability to manage health-related needs will improve Outcome: Progressing   Problem: Clinical Measurements: Goal: Ability to maintain clinical measurements within normal limits will improve Outcome: Progressing Goal: Will remain free from infection 05/14/2024 1916 by Dock Freer, LPN Outcome: Progressing 05/14/2024 1916 by Dock Freer, LPN Outcome: Progressing Goal: Diagnostic test results will  improve 05/14/2024 1916 by Dock Freer, LPN Outcome: Progressing 05/14/2024 1916 by Dock Freer, LPN Outcome: Progressing   Problem: Nutrition: Goal: Adequate nutrition will be maintained 05/14/2024 1916 by Dock Freer, LPN Outcome: Progressing 05/14/2024 1916 by Dock Freer, LPN Outcome: Progressing   Problem: Coping: Goal: Level of anxiety will decrease 05/14/2024 1916 by Dock Freer, LPN Outcome: Progressing 05/14/2024 1916 by Dock Freer, LPN Outcome: Progressing   Problem: Elimination: Goal: Will not experience complications related to bowel motility Outcome: Progressing Goal: Will not experience complications related to urinary retention Outcome: Progressing   Problem: Pain Managment: Goal: General experience of comfort will improve and/or be controlled 05/14/2024 1916 by Dock Freer, LPN Outcome: Progressing 05/14/2024 1916 by Dock Freer, LPN Outcome: Progressing   Problem: Safety: Goal: Ability to remain free from injury will improve 05/14/2024 1916 by Dock Freer, LPN Outcome: Progressing 05/14/2024 1916 by Dock Freer, LPN Outcome: Progressing   Problem: Skin Integrity: Goal: Risk for impaired skin integrity will decrease 05/14/2024 1916 by Dock Freer, LPN Outcome: Progressing 05/14/2024 1916 by Dock Freer, LPN Outcome: Progressing   Problem: Metabolic: Goal: Ability to maintain appropriate glucose levels will improve Outcome: Progressing   Problem: Nutritional: Goal: Maintenance of adequate nutrition will improve Outcome: Progressing   Problem: Skin Integrity: Goal: Risk for impaired skin integrity will decrease 05/14/2024 1916 by Dock Freer, LPN Outcome: Progressing 05/14/2024 1916 by Dock Freer, LPN Outcome: Progressing

## 2024-05-14 NOTE — Plan of Care (Signed)
  Problem: Education: Goal: Understanding of discharge needs will improve Outcome: Progressing   Problem: Activity: Goal: Ability to tolerate increased activity will improve Outcome: Progressing   Problem: Bowel/Gastric: Goal: Gastrointestinal status for postoperative course will improve Outcome: Progressing   Problem: Health Behavior/Discharge Planning: Goal: Identification of community resources to assist with postoperative recovery needs will improve Outcome: Progressing   Problem: Nutritional: Goal: Will attain and maintain optimal nutritional status will improve Outcome: Progressing   Problem: Clinical Measurements: Goal: Postoperative complications will be avoided or minimized Outcome: Progressing   Problem: Skin Integrity: Goal: Will show signs of wound healing Outcome: Progressing   Problem: Education: Goal: Knowledge of General Education information will improve Description: Including pain rating scale, medication(s)/side effects and non-pharmacologic comfort measures Outcome: Progressing   Problem: Health Behavior/Discharge Planning: Goal: Ability to manage health-related needs will improve Outcome: Progressing   Problem: Clinical Measurements: Goal: Ability to maintain clinical measurements within normal limits will improve Outcome: Progressing Goal: Will remain free from infection Outcome: Progressing Goal: Diagnostic test results will improve Outcome: Progressing   Problem: Nutrition: Goal: Adequate nutrition will be maintained Outcome: Progressing   Problem: Coping: Goal: Level of anxiety will decrease Outcome: Progressing   Problem: Pain Managment: Goal: General experience of comfort will improve and/or be controlled Outcome: Progressing   Problem: Safety: Goal: Ability to remain free from injury will improve Outcome: Progressing   Problem: Skin Integrity: Goal: Risk for impaired skin integrity will decrease Outcome: Progressing    Problem: Nutritional: Goal: Maintenance of adequate nutrition will improve Outcome: Progressing   Problem: Skin Integrity: Goal: Risk for impaired skin integrity will decrease Outcome: Progressing

## 2024-05-14 NOTE — Progress Notes (Signed)
 PHARMACY - TOTAL PARENTERAL NUTRITION CONSULT NOTE   Indication: Prolonged ileus  Patient Measurements: Height: 5\' 5"  (165.1 cm) Weight: 63.4 kg (139 lb 12.4 oz) IBW/kg (Calculated) : 57 TPN AdjBW (KG): 61.3 Body mass index is 23.26 kg/m. Usual Weight:   Assessment: 31 yoF with history of diverticulitis with perforation and abscess. 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 She requested ostomy takedown and underwent LOA, colostomy takedown, small bowel resection with anastomosis on 5/29. Pharmacy is consulted to dose TPN starting 6/2 for postop ileus.  Glucose / Insulin : no Hx DM - CBGs fairly well controlled on current insulin  regimen (goal 100-150; range 142-152) - 5 units SSI used yesterday Electrolytes:  - K WNL but below goal for ileus and trending down - Phos improved to borderline WNL after repletion yesterday - other lytes WNL Renal: SCr stable WNL; BUN elevated but trending down; UOP low (as charted) Hepatic: WNL except low albumin  (6/3) I/O:  - mIVF: none - NGT 500 ml yesterday (stable from 6/2) - minimal Abd drain OP (~10 ml) GI Imaging: none GI Surgeries / Procedures: - 5/29: LAR, SBR, colostomy takedown, LOA  Central access: PICC placed 6/2 TPN start date: 6/2  Nutritional Goals: - Goal TPN rate is 70 mL/hr (provides 84 g of protein and 1720 kcals per day) - Keep K >/= 4, Mag >/= 2 for ileus  RD Assessment:  Estimated Needs Total Energy Estimated Needs: 1600-1750 kcals Total Protein Estimated Needs: 80-95 grams Total Fluid Estimated Needs: >/= 1.6L  Current Nutrition:  Dys 1 diet ordered - calorie count ordered  Plan:  Now  Potassium phosphate 15 mmol IV x1 Kdur 40 mEq daily x 3 started this AM per MD  Continue TPN at 50 mL/hr at 1800 - hold off on advancing to goal as now on Dysphagia 1/FLD and planning to wean TPN soon if  tolerated Electrolytes in TPN: no changes from yesterday Na 110 mEq/L K 40 mEq/L Ca 5 mEq/L Mg 2 mEq/L Phos 15 mmol/L Cl:Ac 1:2 Remove standard MVI and trace elements from TPN - converted to PO by CCS Continue Sensitive SSI; will reduce to q8 hr CBG checks, given relatively good control Continue thiamine  100 mg daily x 5; will convert to PO MIVF per MD (none currently) Monitor TPN labs on Mon/Thurs   Tera Fellows, PharmD, BCPS [phone] 05/14/2024, 10:26 AM

## 2024-05-14 NOTE — Progress Notes (Signed)
 [facility]  PROGRESS NOTE  Elizabeth Odom:[id number] DOB: 05-31-1941 DOA: 05/08/2024 PCP: Anthon Kins, MD   Brief HPI:    83 year old female, lives independently, medical history significant for remote history of A-fib evaluated with a loop recorder which was negative, at some point was on anticoagulation, had some bleeding and this was discontinued, anemia, PACs, HTN, mild cognitive impairment, syncope, large bowel obstruction prompting colon resection, creation 09/2020 and at that hospitalization found to have left leg DVT, small PE when given recent surgery with inability to use anticoagulation, had IVC filter placed that was subsequently removed, underwent elective robotic rectosigmoid resection, colostomy takedown with bowel anastomosis, extensive lysis of additions on 5/29 after preop cardiac clearance.  On 5/30 even in, patient developed AMS after polypharmacy along with A-fib with RVR and TRH was consulted.  Patient was transferred to stepdown unit.  Course complicated by A-fib with RVR, cardiology consulting, on IV beta-blockers.  Also hypotension, rectal bleed and syncope x 1 on 6/1, prolonged postop ileus, currently with NG tube, PICC line and TPN.    Assessment/Plan:   Postop A-fib with RVR/PVCs Remote history of A-fib not on rate control meds or anticoagulation preop Seen by Dr. Filiberto Hug in preop clearance on 04/08/2024 Likely stress response due to recent surgery, multiple meds, postop acute blood loss anemia. TTE with preserved LVEF.  TSH normal. Cardiology following, started on metoprolol  12.5 mg p.o. every 8 hours Started on apixaban for anticoagulation  Hypotension Had SBP in the 70s on 6/1 afternoon. Suspected due to intravascular volume depletion Improved after IV fluid bolus and maintenance IV fluids.     Acute kidney injury Resolved, likely in setting of hypotension   Syncope x 1 On 6/1 when patient was on bedside commode Suspected due to  hypotension or vasovagal If patient drives, she must be counseled not to drive x 6 months. Echo with preserved LVEF Telemetry with A-fib with controlled ventricular rate.   Postop ileus Management per primary service 6/1: Placed NG tube, PICC line and start TPN. 6/2: Surgeons attempting NG tube clamping and clear liquids trial.   Essential hypertension On amlodipine  5 Mg daily PTA, currently on hold Soft blood pressures as noted above.   Postop acute blood loss anemia Hemoglobin 13.8 on 5/27  Anemia likely multifactorial due to surgical blood loss, hemodilution and acute illness Hemoglobin stable at 8.9      Acute urinary retention Bladder scan this afternoon showed 337 mL.  Some of her abdominal pain may be related to this.  In and out catheterization as needed and hopefully will not need Foley to be left in [place].  Minimize opioids.   Hypomagnesemia Replaced Attempt to keep potassium >4 and magnesium  >2   Hypophosphatemia Replaced by pharmacy in TPN and monitor.   Leukocytosis May be stress response.  No clinical concern for infection apart from recent abdominal surgery-defer antibiotics for that to primary service. Completed IV azithromycin for GI dysmotility No clinical UTI.  Urine microscopy without concern for UTI.  Chest x-ray without pneumonia Fluctuating but overall stable   S/p colostomy takedown Management per primary service.     Mild delirium Ongoing on and off, multifactorial due to acute illness, recent surgery, meds, ICU/hospital delirium Delirium precautions Minimize opioids and sedatives.    Medications     acetaminophen   500 mg Oral TID WC & HS   ascorbic acid   250 mg Oral Daily   aspirin  EC  81 mg Oral Daily   Chlorhexidine  Gluconate Cloth  6 each Topical Daily   cyanocobalamin   1,000 mcg Oral Daily   feeding supplement  1 Container Oral TID BM   insulin  aspart  0-9 Units Subcutaneous Q6H   magic mouthwash  15 mL Oral QID    methocarbamol  (ROBAXIN ) injection  500 mg Intravenous Q8H   metoprolol  tartrate  12.5 mg Oral Q8H   multivitamin with minerals  1 tablet Oral Daily   pantoprazole  40 mg Oral Daily   sodium chloride  flush  10-40 mL Intracatheter Q12H   sodium chloride  flush  3 mL Intravenous Q12H   thiamine  (VITAMIN B1) injection  100 mg Intravenous Q24H     Data Reviewed:   CBG:  Recent Labs  Lab 05/13/24 0625 05/13/24 1209 05/13/24 1808 05/13/24 2352 05/14/24 0527  GLUCAP 142* 186* 145* 152* 150*    SpO2: 97 % O2 Flow Rate (L/min): 6 L/min    Vitals:   05/13/24 1136 05/13/24 1933 05/14/24 0139 05/14/24 0418  BP: (!) 109/47 (!) 110/59  117/60  Pulse: (!) 103 (!) 107    Resp:  18  18  Temp:  98.5 F (36.9 C)  98.4 F (36.9 C)  TempSrc:      SpO2: 97% 96%  97%  Weight:   63.4 kg   Height:          Data Reviewed:  Basic Metabolic Panel: Recent Labs  Lab 05/09/24 0420 05/10/24 0257 05/10/24 0737 05/11/24 0233 05/12/24 0247 05/13/24 0521 05/14/24 0325  NA 134*  --  132* 134* 133* 135 135  K 3.8   < > 4.0 4.2 4.3 3.7 3.5  CL 102  --  103 102 104 102 100  CO2 21*  --  24 24 20* 26 25  GLUCOSE 207*  --  168* 145* 124* 140* 142*  BUN 16  --  27* 27* 47* 47* 34*  CREATININE 1.06*   < > 0.91 0.70 1.07* 0.75 0.78  CALCIUM  8.4*  --  8.3* 8.6* 8.9 8.7* 8.2*  MG 1.6*  --  2.1  --  2.2 2.2 2.0  PHOS  --   --   --   --  3.1 2.2* 2.6   < > = values in this interval not displayed.    CBC: Recent Labs  Lab 05/09/24 1817 05/10/24 0257 05/11/24 1644 05/12/24 0247 05/13/24 0521 05/13/24 1543 05/14/24 0325  WBC 19.7*   < > 11.0* 14.7* 15.1* 12.8* 16.0*  NEUTROABS 17.0*  --   --   --   --   --   --   HGB 11.4*   < > 11.1* 11.0* 8.7* 9.3* 8.9*  HCT 35.5*   < > 33.9* 33.5* 27.4* 29.8* 28.2*  MCV 98.1   < > 95.0 96.0 97.9 101.0* 97.9  PLT 315   < > 368 353 360 398 401*   < > = values in this interval not displayed.    LFT Recent Labs  Lab 05/13/24 0521  AST 33  ALT 25   ALKPHOS 56  BILITOT 0.7  PROT 5.6*  ALBUMIN  2.1*     Antibiotics: Anti-infectives (From admission, onward)    Start     Dose/Rate Route Frequency Ordered Stop   05/09/24 0900  erythromycin  250 mg in sodium chloride  0.9 % 100 mL IVPB        250 mg 100 mL/hr over 60 Minutes Intravenous Every 8 hours 05/09/24 0732 05/11/24 0044   05/08/24 2200  cefoTEtan  (CEFOTAN ) 2 g in sodium chloride   0.9 % 100 mL IVPB        2 g 200 mL/hr over 30 Minutes Intravenous Every 12 hours 05/08/24 1759 05/09/24 0749   05/08/24 1400  neomycin  (MYCIFRADIN ) tablet 1,000 mg  Status:  Discontinued       Placed in "And" Linked Group   1,000 mg Oral 3 times per day 05/08/24 1119 05/08/24 1120   05/08/24 1400  metroNIDAZOLE  (FLAGYL ) tablet 1,000 mg  Status:  Discontinued       Placed in "And" Linked Group   1,000 mg Oral 3 times per day 05/08/24 1119 05/08/24 1120   05/08/24 1130  cefoTEtan  (CEFOTAN ) 2 g in sodium chloride  0.9 % 100 mL IVPB        2 g 200 mL/hr over 30 Minutes Intravenous On call to O.R. 05/08/24 1119 05/09/24 0749            Subjective   Complains of abdominal pain   Objective    Physical Examination:   General-appears in no acute distress Heart-S1-S2, regular, no murmur auscultated Lungs-clear to auscultation bilaterally, no wheezing or crackles auscultated Abdomen-soft, mild generalized tenderness to palpation  Extremities-no edema in the lower extremities Neuro-alert, oriented x3, no focal deficit noted   Status is: Inpatient:      Pressure Injury 10/08/23 Buttocks Right;Mid Stage 2 -  Partial thickness loss of dermis presenting as a shallow open injury with a red, pink wound bed without slough. (Active)  10/08/23 0800  Location: Buttocks  Location Orientation: Right;Mid  Staging: Stage 2 -  Partial thickness loss of dermis presenting as a shallow open injury with a red, pink wound bed without slough.  Wound Description (Comments):   Present on Admission:          Elizabeth Odom   [facility] If 7PM-7AM, please contact night-coverage at [email or web address], Office  [phone]   05/14/2024, 8:26 AM  LOS: 6 days

## 2024-05-15 DIAGNOSIS — E876 Hypokalemia: Secondary | ICD-10-CM | POA: Insufficient documentation

## 2024-05-15 DIAGNOSIS — E43 Unspecified severe protein-calorie malnutrition: Secondary | ICD-10-CM | POA: Diagnosis not present

## 2024-05-15 DIAGNOSIS — K5790 Diverticulosis of intestine, part unspecified, without perforation or abscess without bleeding: Secondary | ICD-10-CM | POA: Diagnosis not present

## 2024-05-15 LAB — COMPREHENSIVE METABOLIC PANEL WITH GFR
ALT: 55 U/L — ABNORMAL HIGH (ref 0–44)
AST: 51 U/L — ABNORMAL HIGH (ref 15–41)
Albumin: 1.6 g/dL — ABNORMAL LOW (ref 3.5–5.0)
Alkaline Phosphatase: 78 U/L (ref 38–126)
Anion gap: 9 (ref 5–15)
BUN: 28 mg/dL — ABNORMAL HIGH (ref 8–23)
CO2: 25 mmol/L (ref 22–32)
Calcium: 7.9 mg/dL — ABNORMAL LOW (ref 8.9–10.3)
Chloride: 98 mmol/L (ref 98–111)
Creatinine, Ser: 0.71 mg/dL (ref 0.44–1.00)
GFR, Estimated: >60 mL/min (ref >60)
Glucose, Bld: 128 mg/dL — ABNORMAL HIGH (ref 70–99)
Potassium: 3.4 mmol/L — ABNORMAL LOW (ref 3.5–5.1)
Sodium: 132 mmol/L — ABNORMAL LOW (ref 135–145)
Total Bilirubin: 1.8 mg/dL — ABNORMAL HIGH (ref 0.0–1.2)
Total Protein: 5.3 g/dL — ABNORMAL LOW (ref 6.5–8.1)

## 2024-05-15 LAB — GLUCOSE, CAPILLARY
Glucose-Capillary: 131 mg/dL — ABNORMAL HIGH (ref 70–99)
Glucose-Capillary: 142 mg/dL — ABNORMAL HIGH (ref 70–99)

## 2024-05-15 LAB — MAGNESIUM: Magnesium: 1.9 mg/dL (ref 1.7–2.4)

## 2024-05-15 LAB — PHOSPHORUS: Phosphorus: 3 mg/dL (ref 2.5–4.6)

## 2024-05-15 MED ORDER — METHOCARBAMOL 500 MG PO TABS
500.0000 mg | ORAL_TABLET | Freq: Three times a day (TID) | ORAL | Status: AC
  Administered 2024-05-15 – 2024-05-17 (×9): 500 mg via ORAL
  Filled 2024-05-15 (×9): qty 1

## 2024-05-15 MED ORDER — MAGIC MOUTHWASH
15.0000 mL | Freq: Four times a day (QID) | ORAL | Status: AC | PRN
Start: 2024-05-15 — End: 2024-05-16
  Filled 2024-05-15: qty 15

## 2024-05-15 MED ORDER — METOPROLOL SUCCINATE ER 25 MG PO TB24
25.0000 mg | ORAL_TABLET | Freq: Every day | ORAL | Status: DC
  Administered 2024-05-15: 25 mg via ORAL
  Filled 2024-05-15: qty 1

## 2024-05-15 MED ORDER — METHOCARBAMOL 1000 MG/10ML IJ SOLN
500.0000 mg | Freq: Four times a day (QID) | INTRAMUSCULAR | Status: DC | PRN
  Administered 2024-05-18: 500 mg via INTRAVENOUS
  Filled 2024-05-15: qty 10

## 2024-05-15 MED ORDER — METOPROLOL SUCCINATE ER 25 MG PO TB24
12.5000 mg | ORAL_TABLET | Freq: Once | ORAL | Status: AC
  Administered 2024-05-15: 12.5 mg via ORAL
  Filled 2024-05-15: qty 1

## 2024-05-15 MED ORDER — BOOST PLUS PO LIQD
237.0000 mL | Freq: Three times a day (TID) | ORAL | Status: DC
  Filled 2024-05-15 (×19): qty 237

## 2024-05-15 MED ORDER — GERHARDT'S BUTT CREAM
TOPICAL_CREAM | Freq: Three times a day (TID) | CUTANEOUS | Status: DC | PRN
  Filled 2024-05-15: qty 60

## 2024-05-15 MED ORDER — TRAVASOL 10 % IV SOLN
INTRAVENOUS | Status: AC
  Filled 2024-05-15: qty 600

## 2024-05-15 NOTE — Progress Notes (Signed)
 Physical Therapy Treatment Patient Details Name: Elizabeth Odom MRN: [id number] DOB: [birth year] Today's Date: 05/15/2024   History of Present Illness 83 y.o. female s/p rectosigmoid resection takedown on 5/29. 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    PT Comments  POD # 7 Pt found in bed soaked with urine (has a cath) and loose, dark, reddish BM. Assisted OOB to [facility].  General transfer comment: VC's on proper hand placment to "push up" vs "pull up" on walker.  Assisted from EOB to [facility] Min Assist + 2 for safety.  Immediate loose stools upond standing.  Assisted with sit to stand THREE times for peri care, cream application and repeat uncontrolled loose watery stools.  RN called to room for skin inspection red/irritation and stools have a reddish dark tint. Unable to attempt amb due to freq, uncontrolled BM's.  Positioned in recliner to comfort. AxO x 3 pleasant Lady, Claustrophobic does not like door or curtain shut, lives home alone. "When can I go home?" Pt asks.  Motivated.  Following all directions. LPT has rec HH PT.  Will need initial assist from family/friends.  Pt is eager to go home.    If plan is discharge home, recommend the following: A little help with walking and/or transfers;A little help with bathing/dressing/bathroom;Assistance with cooking/housework;Assist for transportation;Help with stairs or ramp for entrance   Can travel by private vehicle        Equipment Recommendations  None recommended by PT    Recommendations for Other Services       Precautions / Restrictions Precautions Precautions: Fall Precaution/Restrictions Comments: ABD surgery Restrictions Weight Bearing Restrictions Per Provider Order: No     Mobility  Bed Mobility Overal  bed mobility: Needs Assistance Bed Mobility: Supine to Sit       Sit to supine: Min assist, Mod assist   General bed mobility comments: partial "log roll" and use of rail.  Increased ABD pain with activity.    Transfers Overall transfer level: Needs assistance Equipment used: Rolling walker (2 wheels) Transfers: Sit to/from Stand Sit to Stand: Contact guard assist, Min assist           General transfer comment: VC's on proper hand placment to "push up" vs "pull up" on walker.  Assisted from EOB to [facility] Min Assist + 2 for safety.  Immediate loose stools upond standing.  Assisted with sit to stand THREE times for peri care, cream application and repeat uncontrolled loose watery stools.  RN called to room for skin inspection red/irritation and stools have a reddish dark tint.    Ambulation/Gait               General Gait Details: deferred due to MAX uncontrolled watery, loose stools.   Stairs             Wheelchair Mobility     Tilt Bed    Modified Rankin (Stroke Patients Only)       Balance                                            Communication Communication Communication: No apparent difficulties  Cognition Arousal: Alert Behavior During Therapy: WFL for tasks assessed/performed  PT - Cognition Comments: AxO x 3 pleasant Lady, Claustrophobic does not like door or curtain shut, lives home alone. "When can I go home?" Pt asks.  Motivated.  Following all directions. Following commands: Intact      Cueing Cueing Techniques: Verbal cues  Exercises      General Comments        Pertinent Vitals/Pain Pain Assessment Pain Assessment: Faces Faces Pain Scale: Hurts little more Pain Location: abdomen with activity Pain Descriptors / Indicators: Grimacing, Constant, Discomfort Pain Intervention(s): Monitored during session, Repositioned    Home Living                          Prior  Function            PT Goals (current goals can now be found in the care plan section) Progress towards PT goals: Progressing toward goals    Frequency    Min 3X/week      PT Plan      Co-evaluation              AM-PAC PT "6 Clicks" Mobility   Outcome Measure  Help needed turning from your back to your side while in a flat bed without using bedrails?: A Lot Help needed moving from lying on your back to sitting on the side of a flat bed without using bedrails?: A Lot Help needed moving to and from a bed to a chair (including a wheelchair)?: A Lot Help needed standing up from a chair using your arms (e.g., wheelchair or bedside chair)?: A Lot Help needed to walk in hospital room?: A Lot Help needed climbing 3-5 steps with a railing? : A Lot 6 Click Score: 12    End of Session Equipment Utilized During Treatment: Gait belt Activity Tolerance: Treatment limited secondary to medical complications (Comment) Patient left: in chair;with call bell/phone within reach Nurse Communication: Mobility status PT Visit Diagnosis: Unsteadiness on feet (R26.81);Muscle weakness (generalized) (M62.81);Difficulty in walking, not elsewhere classified (R26.2);Pain     Time: 4696-2952 PT Time Calculation (min) (ACUTE ONLY): 24 min  Charges:    $Therapeutic Activity: 23-37 mins PT General Charges $$ ACUTE PT VISIT: 1 Visit                     Bess Broody  PTA Acute  Rehabilitation Services Office M-F          [phone]

## 2024-05-15 NOTE — Progress Notes (Signed)
 [facility]  PROGRESS NOTE  Elizabeth Odom ZOX:[id number] DOB: February 26, 1941 DOA: 05/08/2024 PCP: Anthon Kins, MD   Brief HPI:    83 year old female, lives independently, medical history significant for remote history of A-fib evaluated with a loop recorder which was negative, at some point was on anticoagulation, had some bleeding and this was discontinued, anemia, PACs, HTN, mild cognitive impairment, syncope, large bowel obstruction prompting colon resection, creation 09/2020 and at that hospitalization found to have left leg DVT, small PE when given recent surgery with inability to use anticoagulation, had IVC filter placed that was subsequently removed, underwent elective robotic rectosigmoid resection, colostomy takedown with bowel anastomosis, extensive lysis of additions on 5/29 after preop cardiac clearance.  On 5/30 even in, patient developed AMS after polypharmacy along with A-fib with RVR and TRH was consulted.  Patient was transferred to stepdown unit.  Course complicated by A-fib with RVR, cardiology consulting, on IV beta-blockers.  Also hypotension, rectal bleed and syncope x 1 on 6/1, prolonged postop ileus, currently with NG tube, PICC line and TPN.    Assessment/Plan:   Postop A-fib with RVR/PVCs Remote history of A-fib not on rate control meds or anticoagulation preop Seen by Dr. Filiberto Hug in preop clearance on 04/08/2024 Likely stress response due to recent surgery, multiple meds, postop acute blood loss anemia. TTE with preserved LVEF.  TSH normal. Cardiology following, started on Toprol  XL 25 mg at bedtime Started on apixaban for anticoagulation  Hypotension Had SBP in the 70s on 6/1 afternoon. Suspected due to intravascular volume depletion Improved after IV fluid bolus and maintenance IV fluids.     Acute kidney injury Resolved, likely in setting of hypotension   Syncope x 1 On 6/1 when patient was on bedside commode Suspected due to hypotension or  vasovagal If patient drives, she must be counseled not to drive x 6 months. Echo with preserved LVEF Telemetry with A-fib with controlled ventricular rate.   Postop ileus Management per primary service 6/1: Placed NG tube, PICC line and start TPN. 6/2: Surgeons attempting NG tube clamping and clear liquids trial.   Essential hypertension On amlodipine  5 Mg daily PTA, currently on hold Soft blood pressures as noted above.   Postop acute blood loss anemia Hemoglobin 13.8 on 5/27  Anemia likely multifactorial due to surgical blood loss, hemodilution and acute illness Hemoglobin stable at 8.9      Acute urinary retention Bladder scan this afternoon showed 337 mL.  Some of her abdominal pain may be related to this.  In and out catheterization as needed and hopefully will not need Foley to be left in [place].  Minimize opioids.   Hypomagnesemia Replaced Attempt to keep potassium >4 and magnesium  >2   Hypophosphatemia Replaced by pharmacy in TPN and monitor.   Leukocytosis May be stress response.  No clinical concern for infection apart from recent abdominal surgery-defer antibiotics for that to primary service. Completed IV azithromycin for GI dysmotility No clinical UTI.  Urine microscopy without concern for UTI.  Chest x-ray without pneumonia Fluctuating but overall stable   S/p colostomy takedown Management per primary service.     Mild delirium Ongoing on and off, multifactorial due to acute illness, recent surgery, meds, ICU/hospital delirium Delirium precautions Minimize opioids and sedatives.    Medications     acetaminophen   500 mg Oral TID WC & HS   apixaban  5 mg Oral BID   ascorbic acid   250 mg Oral Daily   aspirin  EC  81 mg  Oral Daily   bismuth subsalicylate  30 mL Oral BID   Chlorhexidine  Gluconate Cloth  6 each Topical Daily   cyanocobalamin   1,000 mcg Oral Daily   insulin  aspart  0-9 Units Subcutaneous Q8H   lactose free nutrition  237 mL Oral TID  WC   methocarbamol   500 mg Oral TID   metoprolol  succinate  25 mg Oral QHS   multivitamin with minerals  1 tablet Oral Daily   pantoprazole  40 mg Oral Daily   polycarbophil  625 mg Oral BID   potassium chloride   40 mEq Oral Daily   sodium chloride  flush  10-40 mL Intracatheter Q12H   sodium chloride  flush  3 mL Intravenous Q12H   thiamine   100 mg Oral Daily     Data Reviewed:   CBG:  Recent Labs  Lab 05/13/24 2352 05/14/24 0527 05/14/24 1458 05/14/24 2337 05/15/24 0542  GLUCAP 152* 150* 144* 175* 131*    SpO2: 98 % O2 Flow Rate (L/min): 6 L/min    Vitals:   05/14/24 1922 05/15/24 0458 05/15/24 1216 05/15/24 1353  BP: (!) 106/48 (!) 107/53 (!) 101/51 (!) 91/55  Pulse: 98 (!) 104 (!) 102 98  Resp: 15 18  18   Temp: 98.6 F (37 C) 98.3 F (36.8 C) 98.2 F (36.8 C) 98.2 F (36.8 C)  TempSrc:   Oral   SpO2: 98% 97%  98%  Weight:      Height:          Data Reviewed:  Basic Metabolic Panel: Recent Labs  Lab 05/10/24 0737 05/11/24 0233 05/12/24 0247 05/13/24 0521 05/14/24 0325 05/15/24 0339  NA 132* 134* 133* 135 135 132*  K 4.0 4.2 4.3 3.7 3.5 3.4*  CL 103 102 104 102 100 98  CO2 24 24 20* 26 25 25   GLUCOSE 168* 145* 124* 140* 142* 128*  BUN 27* 27* 47* 47* 34* 28*  CREATININE 0.91 0.70 1.07* 0.75 0.78 0.71  CALCIUM  8.3* 8.6* 8.9 8.7* 8.2* 7.9*  MG 2.1  --  2.2 2.2 2.0 1.9  PHOS  --   --  3.1 2.2* 2.6 3.0    CBC: Recent Labs  Lab 05/09/24 1817 05/10/24 0257 05/11/24 1644 05/12/24 0247 05/13/24 0521 05/13/24 1543 05/14/24 0325  WBC 19.7*   < > 11.0* 14.7* 15.1* 12.8* 16.0*  NEUTROABS 17.0*  --   --   --   --   --   --   HGB 11.4*   < > 11.1* 11.0* 8.7* 9.3* 8.9*  HCT 35.5*   < > 33.9* 33.5* 27.4* 29.8* 28.2*  MCV 98.1   < > 95.0 96.0 97.9 101.0* 97.9  PLT 315   < > 368 353 360 398 401*   < > = values in this interval not displayed.    LFT Recent Labs  Lab 05/13/24 0521 05/15/24 0339  AST 33 51*  ALT 25 55*  ALKPHOS 56 78   BILITOT 0.7 1.8*  PROT 5.6* 5.3*  ALBUMIN  2.1* 1.6*     Antibiotics: Anti-infectives (From admission, onward)    Start     Dose/Rate Route Frequency Ordered Stop   05/09/24 0900  erythromycin  250 mg in sodium chloride  0.9 % 100 mL IVPB        250 mg 100 mL/hr over 60 Minutes Intravenous Every 8 hours 05/09/24 0732 05/11/24 0044   05/08/24 2200  cefoTEtan  (CEFOTAN ) 2 g in sodium chloride  0.9 % 100 mL IVPB  2 g 200 mL/hr over 30 Minutes Intravenous Every 12 hours 05/08/24 1759 05/09/24 0749   05/08/24 1400  neomycin  (MYCIFRADIN ) tablet 1,000 mg  Status:  Discontinued       Placed in "And" Linked Group   1,000 mg Oral 3 times per day 05/08/24 1119 05/08/24 1120   05/08/24 1400  metroNIDAZOLE  (FLAGYL ) tablet 1,000 mg  Status:  Discontinued       Placed in "And" Linked Group   1,000 mg Oral 3 times per day 05/08/24 1119 05/08/24 1120   05/08/24 1130  cefoTEtan  (CEFOTAN ) 2 g in sodium chloride  0.9 % 100 mL IVPB        2 g 200 mL/hr over 30 Minutes Intravenous On call to O.R. 05/08/24 1119 05/09/24 0749            Subjective    Denies any complaints  Objective    Physical Examination:   General-appears in no acute distress Heart-irregularly irregular rhythm Lungs-clear to auscultation bilaterally, no wheezing or crackles auscultated Abdomen-soft, nontender, no organomegaly Extremities-no edema in the lower extremities Neuro-alert, oriented x3, no focal deficit noted   Status is: Inpatient:      Pressure Injury 10/08/23 Buttocks Right;Mid Stage 2 -  Partial thickness loss of dermis presenting as a shallow open injury with a red, pink wound bed without slough. (Active)  10/08/23 0800  Location: Buttocks  Location Orientation: Right;Mid  Staging: Stage 2 -  Partial thickness loss of dermis presenting as a shallow open injury with a red, pink wound bed without slough.  Wound Description (Comments):   Present on Admission:         Elizabeth Odom S  Jordi Kamm   [facility] If 7PM-7AM, please contact night-coverage at [email or web address], Office  [phone]   05/15/2024, 4:04 PM  LOS: 7 days

## 2024-05-15 NOTE — Progress Notes (Signed)
 Progress Note  Patient Name: Elizabeth Odom Date of Encounter: 05/15/2024  Primary Cardiologist: Cody Das, MD   Subjective   Patient seen and examined.  She was awake when I arrived.   Inpatient Medications    Scheduled Meds:  acetaminophen   500 mg Oral TID WC & HS   apixaban  5 mg Oral BID   ascorbic acid   250 mg Oral Daily   aspirin  EC  81 mg Oral Daily   bismuth subsalicylate  30 mL Oral BID   Chlorhexidine  Gluconate Cloth  6 each Topical Daily   cyanocobalamin   1,000 mcg Oral Daily   insulin  aspart  0-9 Units Subcutaneous Q8H   lactose free nutrition  237 mL Oral TID WC   methocarbamol   500 mg Oral TID   metoprolol  tartrate  12.5 mg Oral Q8H   multivitamin with minerals  1 tablet Oral Daily   pantoprazole  40 mg Oral Daily   polycarbophil  625 mg Oral BID   potassium chloride   40 mEq Oral Daily   sodium chloride  flush  10-40 mL Intracatheter Q12H   sodium chloride  flush  3 mL Intravenous Q12H   thiamine   100 mg Oral Daily   Continuous Infusions:  sodium chloride      lactated ringers      TPN ADULT (ION) 50 mL/hr at 05/15/24 0023   TPN ADULT (ION)     PRN Meds: sodium chloride , alum & mag hydroxide-simeth, diphenhydrAMINE  **OR** [DISCONTINUED] diphenhydrAMINE , fentaNYL  (SUBLIMAZE ) injection, Gerhardt's butt cream, hydrALAZINE , HYDROcodone -acetaminophen , lactated ringers , magic mouthwash, menthol -cetylpyridinium, methocarbamol  (ROBAXIN ) injection, naphazoline-glycerin , [DISCONTINUED] ondansetron  **OR** ondansetron  (ZOFRAN ) IV, mouth rinse, phenol, [DISCONTINUED] prochlorperazine  **OR** prochlorperazine , simethicone , sodium chloride , sodium chloride  flush, sodium chloride  flush   Vital Signs    Vitals:   05/14/24 0418 05/14/24 1651 05/14/24 1922 05/15/24 0458  BP: 117/60 107/62 (!) 106/48 (!) 107/53  Pulse:  90 98 (!) 104  Resp: 18  15 18   Temp: 98.4 F (36.9 C)  98.6 F (37 C) 98.3 F (36.8 C)  TempSrc:      SpO2: 97% 97% 98% 97%  Weight:       Height:        Intake/Output Summary (Last 24 hours) at 05/15/2024 1125 Last data filed at 05/15/2024 0600 Gross per 24 hour  Intake 1765.12 ml  Output 400 ml  Net 1365.12 ml   Filed Weights   05/11/24 0404 05/13/24 0500 05/14/24 0139  Weight: 59.6 kg 58.9 kg 63.4 kg    Telemetry    Atrial fibrillation but rate controlled  - Personally Reviewed  ECG     - Personally Reviewed  Physical Exam   General: Comfortable Head: Atraumatic, normal size  Eyes: PEERLA, EOMI  Neck: Supple, normal JVD Cardiac: Normal S1, S2; RRR; no murmurs, rubs, or gallops Lungs: Clear to auscultation bilaterally Abd: Soft, nontender, no hepatomegaly  Ext: warm, no edema Musculoskeletal: No deformities, BUE and BLE strength normal and equal Skin: Warm and dry, no rashes   Neuro: Alert and oriented to person, place, time, and situation, CNII-XII grossly intact, no focal deficits  Psych: Normal mood and affect   Labs    Chemistry Recent Labs  Lab 05/13/24 0521 05/14/24 0325 05/15/24 0339  NA 135 135 132*  K 3.7 3.5 3.4*  CL 102 100 98  CO2 26 25 25   GLUCOSE 140* 142* 128*  BUN 47* 34* 28*  CREATININE 0.75 0.78 0.71  CALCIUM  8.7* 8.2* 7.9*  PROT 5.6*  --  5.3*  ALBUMIN  2.1*  --  1.6*  AST 33  --  51*  ALT 25  --  55*  ALKPHOS 56  --  78  BILITOT 0.7  --  1.8*  GFRNONAA >60 >60 >60  ANIONGAP 7 10 9      Hematology Recent Labs  Lab 05/13/24 0521 05/13/24 1543 05/14/24 0325  WBC 15.1* 12.8* 16.0*  RBC 2.80* 2.95* 2.88*  HGB 8.7* 9.3* 8.9*  HCT 27.4* 29.8* 28.2*  MCV 97.9 101.0* 97.9  MCH 31.1 31.5 30.9  MCHC 31.8 31.2 31.6  RDW 14.0 14.2 14.2  PLT 360 398 401*    Cardiac EnzymesNo results for input(s): "TROPONINI" in the last 168 hours. No results for input(s): "TROPIPOC" in the last 168 hours.   BNPNo results for input(s): "BNP", "PROBNP" in the last 168 hours.   DDimer No results for input(s): "DDIMER" in the last 168 hours.   Radiology    No results  found.   Cardiac Studies   Echo   Patient Profile     83 y.o. female status post rectosigmoid resection takedown and now with post op atrial fibrillation and flutter  Assessment & Plan    Paroxysmal atrial fib and flutter - i her heart rate is not well-controlled, she still in A-fib but I would like to do at this time is transition the patient to Toprol  XL 25 mg at nighttime.  Will give her 12.5 one-time dosing now.  She has tolerated Eliquis, continue to monitor her hemoglobin.  AKI-this has resolved.  Syncpe - suspect vasovagal    For questions or updates, please contact [facility] Please consult [email or web address] for contact info under Cardiology/STEMI.      Signed, Henri Baumler, DO  05/15/2024, 11:25 AM

## 2024-05-15 NOTE — Progress Notes (Signed)
 PHARMACY - TOTAL PARENTERAL NUTRITION CONSULT NOTE   Indication: Prolonged ileus  Patient Measurements: Height: 5\' 5"  (165.1 cm) Weight: 63.4 kg (139 lb 12.4 oz) IBW/kg (Calculated) : 57 TPN AdjBW (KG): 61.3 Body mass index is 23.26 kg/m.  Assessment: 17 yoF with history of diverticulitis with perforation and abscess. 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 She requested ostomy takedown and underwent LOA, colostomy takedown, small bowel resection with anastomosis on 5/29. Pharmacy is consulted to dose TPN starting 6/2 for postop ileus.  Glucose / Insulin : no Hx DM -CBGs fairly well controlled on current insulin  regimen (goal 100-150; range 131-175) -5u SSI/24 hrs Electrolytes:  -Na 132, K 3.4 -All others WNL, including corr Ca Renal: SCr stable WNL; BUN elevated but trending down Hepatic: AST/ALT, Tbili slightly elevated; alb low 1.6 I/O:  -UOP 800 ml documented yesterday -Negligible drain output GI Imaging: none GI Surgeries / Procedures: -5/29: LAR, SBR, colostomy takedown, LOA  Central access: PICC placed 6/2 TPN start date: 6/2  Nutritional Goals: -Goal TPN rate is 70 mL/hr (provides 84 g of protein and 1720 kcals per day) -Keep K >/= 4, Mag >/= 2 for ileus  RD Assessment:  Estimated Needs Total Energy Estimated Needs: 1600-1750 kcals Total Protein Estimated Needs: 80-95 grams Total Fluid Estimated Needs: >/= 1.6L  Current Nutrition:  Calorie count ongoing - only consumed 25% of one meal 6/4 Advance to soft diet 6/5  Plan:  Potassium 40 mEq PO given this morning Continue TPN at 50 mL/hr (was not advanced to goal yesterday in anticipation of starting oral diet) Per surgeon, ok to cut TPN rate in half if calorie count meeting > 50% of needs. Per discussion with dietitian, patient only consumed 25% of one meal yesterday and refused Boost  supplements. Diet advanced to soft today and Boost supplements changed to see if she would like these better. Will defer any changes to TPN today; hopeful for increased PO intake today with diet advancement. Electrolytes in TPN: Na 130 mEq/L, K 50 mEq/L, Ca 5 mEq/L, Mg 5 mEq/L, Phos 15 mmol/L. Cl:Ac 1:1 Continue Sensitive SSI q8hr Thiamine  100 mg daily x 5 days Monitor TPN labs on Mon/Thurs   Lolita Rise, PharmD, BCPS Clinical Pharmacist 05/15/2024 10:03 AM

## 2024-05-15 NOTE — Progress Notes (Signed)
 Patient had medium sized amount of bleeding from rectum during peri care. Most recent vital signs are 98.2, 104/56, 108, 17, 99 RA. MD on call, Dr. Andy Bannister paged via [place] Foley. No new orders. Plan of care ongoing.

## 2024-05-15 NOTE — Plan of Care (Signed)
 Pt granddaughter updated on pt change of room from 1517 to 1308

## 2024-05-15 NOTE — Progress Notes (Addendum)
 05/15/2024  Elizabeth Odom [id number] 01-Jan-1941  CARE TEAM: PCP: Anthon Kins, MD  Outpatient Care Team: Patient Care Team: Anthon Kins, MD as PCP - General (Internal Medicine) Cody Das, MD as PCP - Cardiology (Cardiology) Lajuan Pila, MD as Consulting Physician (Gastroenterology) Shela Derby, MD as Consulting Physician (General Surgery) Glory Larsen, MD (Neurology) Lahoma Pigg, MD as Consulting Physician (Urology) Sandee Crook Margart Shears, MD as Consulting Physician (Cardiology) Candyce Champagne, MD as Consulting Physician (Colon and Rectal Surgery)  Inpatient Treatment Team: Treatment Team:  Candyce Champagne, MD Ruel Cotta, Heinz Llano, MD [facility], Rounding, MD Verlin Glory, MD Roberta Chin, RN Ozell Blunt, MD Chesley Cost, PTA Lolita Rise, RPH Edra Govern, RN Creta Dolin, RN   Problem List:   Principal Problem:   Diverticular disease Active Problems:   A-fib [facility])   Restless leg   ABLA (acute blood loss anemia)   Need for emotional support   Acute urinary retention   Mixed hyperlipidemia   Essential hypertension   Diverticular disease of left colon   History of DVT (deep vein thrombosis)   History of pulmonary embolus (PE)   Pre-diabetes   Presence of IVC filter   Stricture of sigmoid colon (HCC)   Protein-calorie malnutrition, severe   Hypokalemia   05/08/2024  POST-OPERATIVE DIAGNOSIS:   COLOSTOMY FOR RESECTION, DESIRE FOR OSTOMY TAKEDOWN RECTAL STRICTURE HISTORY OF DIVERTICULITIS WITH PERFORATION & ABSCESS   PROCEDURE:   -ROBOTIC RECTOSIGMOID RESECTION (LAR) -TAKEDOWN OF END COLOSTOMY WITH ANASTOMOSIS -RESECTION OF SMALL INTESTINE WITH ANASTOMOSIS -SMALL BOWEL REPAIR -LYSIS OF ADHESIONS x 115 MINUTES (66% OF CASE),  -INTRAOPERATIVE ASSESSMENT OF TISSUE VASCULAR PERFUSION USING ICG (indocyanine green ) IMMUNOFLUORESCENCE,  -TRANSVERSUS ABDOMINIS PLANE (TAP) BLOCK - BILATERAL -FLEXIBLE  SIGMOIDOSCOPY   SURGEON:  Eddye Goodie, MD  OR FINDINGS:  Very dense adhesions of small bowel especially to pelvis and retroperitoneum.  Extremely concrete dense adhesions with friable tissue near her old prior small bowel anastomosis and distal jejunum.  Required small bowel repair and required small bowel resection including old anastomosis given severe tissues.  Patient had significant fibrotic stricturing of rectal stump at the mid/distal rectal junction.  Could not be released up.  Ended up doing resection of rectal stump to mid/distal rectal junction.  No obvious metastatic disease on visceral parietal peritoneum or liver.   It is a 29mm EEA anastomosis ( distal descending colon  connected to mid/distal rectal junction.)  It rests 7 cm from the anal verge by rigid proctoscopy.  Assessment [facility] Stay = 7 days) 7 Days Post-Op    Ileus appears to be resolving.    Plan: Continue to gradually advance diet.  Calorie counts.  TPN through PICC line.  If we can go to half plate today and gradually wean off if adequate calorie counts.  Partial urine retention status post Foley replacement 6/4.  See if we can remove it prior to discharge.  Perhaps in the morning.  Spoke with Dr Dulcy Gibney with Urology.  I think I will hold off on Flomax for now given her blood pressure unless she fails removal.  We will see.    Post ileus diarrhea.  No rectal tubes or catching bags in the area.  Bedside commode.  Continue fiber and Pepto-Bismol to gently thicken and slow down.  Avoid Imodium unless she is on solid diet and still struggling.  Hopefully not too likely.  We will see.  Atrial fibrillation postop.  History of a  prior event.    Cardiology following.    Started DOAC Eliquis 6/4.  Rate controlled switch from amlodipine  to metoprolol .  Mildly tachycardic but they feel okay as long as heart rate less than 120 and blood pressure okay.  Defer to Dr. Emmette Harms & cardiology team.  Pain control proved  so far.  Challenge in this patient  Too sedated with morphine  & Dilaudid .    Fentanyl  for breakthrough at higher doses seems to be more successful.  Follow.    Restart Tylenol .    Add PO muscle relaxant.    See if hydrocodone  will work (tramadol  too weak and oxycodone  too strong)  Rehydration as tolerated.  Stop extra IV fluids and keep on the dry side.  -monitor electrolytes & replace as needed.  Potassium borderline.  Add oral supplementation. Keep K>4, Mg>2, Phos>3  -VTE prophylaxis- SCDs.  Anticoagulation as appropriate  -mobilize as tolerated to help recovery.  Enlist therapies in moderate/high risk patients as appropriate  I think she is stable enough to go back up to the floor.  Reasonable placed on telemetry to keep an eye on cardiac rate.  Cardiology and medicine help appreciated.  I updated the patient's status to the patient and nurse.  Discussed with [facility] internal medicine.  Asked Dr. Sherrod Dolphin to follow at least 1 or 2 more days until I get a send she is stable and can come out of stepdown.  Recommendations were made.  Questions were answered.  She expressed understanding & appreciation.  -Disposition: TBD      I reviewed last 24 h vitals and pain scores, last 48 h intake and output, last 24 h labs and trends, and last 24 h imaging results.  I have reviewed this patient's available data, including medical history, events of note, test results, etc as part of my evaluation.   A significant portion of that time was spent in counseling. Care during the described time interval was provided by me.  This care required moderate level of medical decision making.  05/15/2024    Subjective: (Chief complaint)  Patient feeling better today, 1st day not complaining of abdominal pain.  Foley catheter placed.  I wonder if that was the pressure discomfort she was feeling in her lower abdomen.  Tolerated full liquids and one spaghetti and meatballs.  Did walk a little  bit.  Objective:  Vital signs:  Vitals:   05/14/24 0418 05/14/24 1651 05/14/24 1922 05/15/24 0458  BP: 117/60 107/62 (!) 106/48 (!) 107/53  Pulse:  90 98 (!) 104  Resp: 18  15 18   Temp: 98.4 F (36.9 C)  98.6 F (37 C) 98.3 F (36.8 C)  TempSrc:      SpO2: 97% 97% 98% 97%  Weight:      Height:        Last BM Date : 05/14/24  Intake/Output   Yesterday:  06/04 0701 - 06/05 0700 In: 1885.1 [P.O.:1280; I.V.:348.9; IV Piggyback:256.2] Out: 800 [Urine:800] This shift:  No intake/output data recorded.  Bowel function:  Flatus: YES  BM:  YES  Drain: (No drain)    Physical Exam:  General: Pt sleeping to awaken and mild discomfort but no major distress  Eyes: PERRL, normal EOM.  Sclera clear.  No icterus Neuro: CN II-XII intact w/o focal sensory/motor deficits. Lymph: No head/neck/groin lymphadenopathy Psych:  No delerium/psychosis/paranoia.  Oriented x 4 HENT: Normocephalic, Mucus membranes moist.  No thrush Neck: Supple, No tracheal deviation.  No obvious thyromegaly Chest: No pain to chest  wall compression.  Good respiratory excursion.  No audible wheezing CV:  Pulses intact.  Regular rhythm.  No major extremity edema MS: Normal AROM mjr joints.  No obvious deformity  Abdomen:  Soft.  Moderately distended.  Nontender.   Old ostomy incision with normal healing ridge.  Right lower quadrant old drain site with minimal serous drainage  no evidence of peritonitis.  No incarcerated hernias.  Ext:   No deformity.  No mjr edema.  No cyanosis Skin: No petechiae / purpurea.  No major sores.  Warm and dry    Results:   Cultures: Recent Results (from the past 720 hours)  Urine Culture     Status: Abnormal   Collection Time: 05/09/24  8:30 PM   Specimen: Urine, Clean Catch  Result Value Ref Range Status   Specimen Description   Final    URINE, CLEAN CATCH Performed at [facility], [address] [place], [place] [address]    Special Requests    Final    NONE Performed at [facility], [address] [place], [place] [address]    Culture (A)  Final    <10,000 COLONIES/mL INSIGNIFICANT GROWTH Performed at [facility], [address] [place], [place] [address]    Report Status 05/11/2024 FINAL  Final    Labs: Results for orders placed or performed during the hospital encounter of 05/08/24 (from the past 48 hours)  Glucose, capillary     Status: Abnormal   Collection Time: 05/13/24 12:09 PM  Result Value Ref Range   Glucose-Capillary 186 (H) 70 - 99 mg/dL    Comment: Glucose reference range applies only to samples taken after fasting for at least 8 hours.  CBC     Status: Abnormal   Collection Time: 05/13/24  3:43 PM  Result Value Ref Range   WBC 12.8 (H) 4.0 - 10.5 K/uL   RBC 2.95 (L) 3.87 - 5.11 MIL/uL   Hemoglobin 9.3 (L) 12.0 - 15.0 g/dL   HCT 86.5 (L) 78.4 - 69.6 %   MCV 101.0 (H) 80.0 - 100.0 fL   MCH 31.5 26.0 - 34.0 pg   MCHC 31.2 30.0 - 36.0 g/dL   RDW 29.5 28.4 - 13.2 %   Platelets 398 150 - 400 K/uL   nRBC 0.3 (H) 0.0 - 0.2 %    Comment: Performed at [facility], [address] [place], [place] [address]  Glucose, capillary     Status: Abnormal   Collection Time: 05/13/24  6:08 PM  Result Value Ref Range   Glucose-Capillary 145 (H) 70 - 99 mg/dL    Comment: Glucose reference range applies only to samples taken after fasting for at least 8 hours.  Glucose, capillary     Status: Abnormal   Collection Time: 05/13/24 11:52 PM  Result Value Ref Range   Glucose-Capillary 152 (H) 70 - 99 mg/dL    Comment: Glucose reference range applies only to samples taken after fasting for at least 8 hours.  Basic metabolic panel     Status: Abnormal   Collection Time: 05/14/24  3:25 AM  Result Value Ref Range   Sodium 135 135 - 145 mmol/L   Potassium 3.5 3.5 - 5.1 mmol/L   Chloride 100 98 - 111 mmol/L   CO2 25 22 - 32 mmol/L   Glucose, Bld 142 (H) 70 - 99 mg/dL     Comment: Glucose reference range applies only to samples taken after fasting for  at least 8 hours.   BUN 34 (H) 8 - 23 mg/dL   Creatinine, Ser 1.61 0.44 - 1.00 mg/dL   Calcium  8.2 (L) 8.9 - 10.3 mg/dL   GFR, Estimated >09 >60 mL/min    Comment: (NOTE) Calculated using the CKD-EPI Creatinine Equation (2021)    Anion gap 10 5 - 15    Comment: Performed at [facility], [address], [place], [place] [address]  CBC     Status: Abnormal   Collection Time: 05/14/24  3:25 AM  Result Value Ref Range   WBC 16.0 (H) 4.0 - 10.5 K/uL   RBC 2.88 (L) 3.87 - 5.11 MIL/uL   Hemoglobin 8.9 (L) 12.0 - 15.0 g/dL   HCT 81.1 (L) 91.4 - 78.2 %   MCV 97.9 80.0 - 100.0 fL   MCH 30.9 26.0 - 34.0 pg   MCHC 31.6 30.0 - 36.0 g/dL   RDW 95.6 21.3 - 08.6 %   Platelets 401 (H) 150 - 400 K/uL   nRBC 0.3 (H) 0.0 - 0.2 %    Comment: Performed at [facility], [address] [place], [place] [address]  Magnesium      Status: None   Collection Time: 05/14/24  3:25 AM  Result Value Ref Range   Magnesium  2.0 1.7 - 2.4 mg/dL    Comment: Performed at [facility], [address] [place], [place] [address]  Phosphorus     Status: None   Collection Time: 05/14/24  3:25 AM  Result Value Ref Range   Phosphorus 2.6 2.5 - 4.6 mg/dL    Comment: Performed at [facility], [address] [place], [place] [address]  Glucose, capillary     Status: Abnormal   Collection Time: 05/14/24  5:27 AM  Result Value Ref Range   Glucose-Capillary 150 (H) 70 - 99 mg/dL    Comment: Glucose reference range applies only to samples taken after fasting for at least 8 hours.  Glucose, capillary     Status: Abnormal   Collection Time: 05/14/24  2:58 PM  Result Value Ref Range   Glucose-Capillary 144 (H) 70 - 99 mg/dL    Comment: Glucose reference range applies only to samples taken after fasting for at least 8 hours.  Glucose, capillary     Status: Abnormal    Collection Time: 05/14/24 11:37 PM  Result Value Ref Range   Glucose-Capillary 175 (H) 70 - 99 mg/dL    Comment: Glucose reference range applies only to samples taken after fasting for at least 8 hours.  Comprehensive metabolic panel     Status: Abnormal   Collection Time: 05/15/24  3:39 AM  Result Value Ref Range   Sodium 132 (L) 135 - 145 mmol/L   Potassium 3.4 (L) 3.5 - 5.1 mmol/L   Chloride 98 98 - 111 mmol/L   CO2 25 22 - 32 mmol/L   Glucose, Bld 128 (H) 70 - 99 mg/dL    Comment: Glucose reference range applies only to samples taken after fasting for at least 8 hours.   BUN 28 (H) 8 - 23 mg/dL   Creatinine, Ser 2.44 0.44 - 1.00 mg/dL   Calcium  7.9 (L) 8.9 - 10.3 mg/dL   Total Protein 5.3 (L) 6.5 - 8.1 g/dL   Albumin  1.6 (L) 3.5 - 5.0 g/dL   AST 51 (H) 15 - 41 U/L   ALT 55 (H) 0 - 44 U/L   Alkaline Phosphatase 78 38 - 126  U/L   Total Bilirubin 1.8 (H) 0.0 - 1.2 mg/dL   GFR, Estimated >95 >63 mL/min    Comment: (NOTE) Calculated using the CKD-EPI Creatinine Equation (2021)    Anion gap 9 5 - 15    Comment: Performed at [facility], [address], [place], [place] [address]  Magnesium      Status: None   Collection Time: 05/15/24  3:39 AM  Result Value Ref Range   Magnesium  1.9 1.7 - 2.4 mg/dL    Comment: Performed at [facility], [address] [place], [place] [address]  Phosphorus     Status: None   Collection Time: 05/15/24  3:39 AM  Result Value Ref Range   Phosphorus 3.0 2.5 - 4.6 mg/dL    Comment: Performed at [facility], [address] [place], [place] [address]  Glucose, capillary     Status: Abnormal   Collection Time: 05/15/24  5:42 AM  Result Value Ref Range   Glucose-Capillary 131 (H) 70 - 99 mg/dL    Comment: Glucose reference range applies only to samples taken after fasting for at least 8 hours.    Imaging / Studies: No results found.   Medications / Allergies: per  chart  Antibiotics: Anti-infectives (From admission, onward)    Start     Dose/Rate Route Frequency Ordered Stop   05/09/24 0900  erythromycin  250 mg in sodium chloride  0.9 % 100 mL IVPB        250 mg 100 mL/hr over 60 Minutes Intravenous Every 8 hours 05/09/24 0732 05/11/24 0044   05/08/24 2200  cefoTEtan  (CEFOTAN ) 2 g in sodium chloride  0.9 % 100 mL IVPB        2 g 200 mL/hr over 30 Minutes Intravenous Every 12 hours 05/08/24 1759 05/09/24 0749   05/08/24 1400  neomycin  (MYCIFRADIN ) tablet 1,000 mg  Status:  Discontinued       Placed in "And" Linked Group   1,000 mg Oral 3 times per day 05/08/24 1119 05/08/24 1120   05/08/24 1400  metroNIDAZOLE  (FLAGYL ) tablet 1,000 mg  Status:  Discontinued       Placed in "And" Linked Group   1,000 mg Oral 3 times per day 05/08/24 1119 05/08/24 1120   05/08/24 1130  cefoTEtan  (CEFOTAN ) 2 g in sodium chloride  0.9 % 100 mL IVPB        2 g 200 mL/hr over 30 Minutes Intravenous On call to O.R. 05/08/24 1119 05/09/24 0749         Note: Portions of this report may have been transcribed using voice recognition software. Every effort was made to ensure accuracy; however, inadvertent computerized transcription errors may be present.   Any transcriptional errors that result from this process are unintentional.    Eddye Goodie, MD, FACS, MASCRS Esophageal, Gastrointestinal & Colorectal Surgery Robotic and Minimally Invasive Surgery  [facility] [address], [address] [place], [place] [address] [phone] Fax [phone] Main  CONTACT INFORMATION: Weekday (9AM-5PM): Call [facility] main office at [phone] Weeknight (5PM-9AM) or Weekend/Holiday: Check EPIC "Web Links" tab & use "AMION" (password " [email or web address]") for General Surgery [facility] coverage  Please, DO NOT use SecureChat  (it is not reliable communication to reach operating surgeons & will lead to a delay in care).   Epic staff messaging  available for outptient concerns needing 1-2 business day response.      05/15/2024  7:51 AM

## 2024-05-15 NOTE — Progress Notes (Signed)
 Calorie Count Note  48 hour calorie count ordered.  Diet: CLD for breakfast, Dysphagia 1 diet for lunch and dinner Supplements: -Boost Breeze po TID, each supplement provides 250 kcal and 9 grams of protein   6/4: Breakfast: 80 kcals Lunch: 145 kcals, 9g protein Dinner: 0 Supplements: Refusing Boost Breeze  Total intake: 225 kcal (14% of minimum estimated needs)  9g protein (11% of minimum estimated needs)  Nutrition Dx: Severe Malnutrition related to chronic illness as evidenced by severe fat depletion, severe muscle depletion.   Goal: Pt to meet >/= 90% of their estimated nutrition needs . Meet >50% needs to wean TPN per surgery.  Intervention:  -Continue day 2 of Calorie Count -Added meal order with assist to ensure pt is ordering meals -D/c Boost Breeze -Boost Plus TID- Each supplement provides 360kcal and 14g protein. -Magic cup BID with meals, each supplement provides 290 kcal and 9 grams of protein      Arna Better, MS, RD, LDN Inpatient Clinical Dietitian Contact via Secure chat

## 2024-05-16 DIAGNOSIS — D62 Acute posthemorrhagic anemia: Secondary | ICD-10-CM | POA: Diagnosis not present

## 2024-05-16 DIAGNOSIS — R338 Other retention of urine: Secondary | ICD-10-CM | POA: Diagnosis not present

## 2024-05-16 DIAGNOSIS — K5790 Diverticulosis of intestine, part unspecified, without perforation or abscess without bleeding: Secondary | ICD-10-CM | POA: Diagnosis not present

## 2024-05-16 DIAGNOSIS — E43 Unspecified severe protein-calorie malnutrition: Secondary | ICD-10-CM | POA: Diagnosis not present

## 2024-05-16 LAB — BASIC METABOLIC PANEL WITH GFR
Anion gap: 11 (ref 5–15)
BUN: 36 mg/dL — ABNORMAL HIGH (ref 8–23)
CO2: 23 mmol/L (ref 22–32)
Calcium: 7.8 mg/dL — ABNORMAL LOW (ref 8.9–10.3)
Chloride: 98 mmol/L (ref 98–111)
Creatinine, Ser: 0.88 mg/dL (ref 0.44–1.00)
GFR, Estimated: >60 mL/min (ref >60)
Glucose, Bld: 160 mg/dL — ABNORMAL HIGH (ref 70–99)
Potassium: 4 mmol/L (ref 3.5–5.1)
Sodium: 132 mmol/L — ABNORMAL LOW (ref 135–145)

## 2024-05-16 LAB — GLUCOSE, CAPILLARY
Glucose-Capillary: 145 mg/dL — ABNORMAL HIGH (ref 70–99)
Glucose-Capillary: 144 mg/dL — ABNORMAL HIGH (ref 70–99)

## 2024-05-16 LAB — CBC
HCT: 19.6 % — ABNORMAL LOW (ref 36.0–46.0)
Hemoglobin: 6.1 g/dL — CL (ref 12.0–15.0)
MCH: 30.7 pg (ref 26.0–34.0)
MCHC: 31.1 g/dL (ref 30.0–36.0)
MCV: 98.5 fL (ref 80.0–100.0)
Platelets: 505 10*3/uL — ABNORMAL HIGH (ref 150–400)
RBC: 1.99 MIL/uL — ABNORMAL LOW (ref 3.87–5.11)
RDW: 14 % (ref 11.5–15.5)
WBC: 32.7 10*3/uL — ABNORMAL HIGH (ref 4.0–10.5)
nRBC: 0.1 % (ref 0.0–0.2)

## 2024-05-16 LAB — PREPARE RBC (CROSSMATCH)

## 2024-05-16 MED ORDER — INSULIN ASPART 100 UNIT/ML IJ SOLN
0.0000 [IU] | Freq: Three times a day (TID) | INTRAMUSCULAR | Status: DC
  Administered 2024-05-16 – 2024-05-18 (×7): 2 [IU] via SUBCUTANEOUS
  Administered 2024-05-19: 1 [IU] via SUBCUTANEOUS
  Administered 2024-05-19 – 2024-05-20 (×2): 2 [IU] via SUBCUTANEOUS
  Administered 2024-05-20: 1 [IU] via SUBCUTANEOUS
  Administered 2024-05-21: 3 [IU] via SUBCUTANEOUS
  Administered 2024-05-21: 2 [IU] via SUBCUTANEOUS

## 2024-05-16 MED ORDER — ZINC OXIDE 40 % EX OINT
TOPICAL_OINTMENT | Freq: Two times a day (BID) | CUTANEOUS | Status: DC
  Administered 2024-05-17 – 2024-06-02 (×10): 1 via TOPICAL
  Filled 2024-05-16 (×7): qty 57

## 2024-05-16 MED ORDER — SODIUM CHLORIDE 0.9% IV SOLUTION: Freq: Once | INTRAVENOUS | Status: AC

## 2024-05-16 MED ORDER — LACTATED RINGERS IV BOLUS: 1000.0000 mL | Freq: Three times a day (TID) | INTRAVENOUS | Status: AC | PRN

## 2024-05-16 MED ORDER — METOPROLOL TARTRATE 12.5 MG HALF TABLET
12.5000 mg | ORAL_TABLET | Freq: Two times a day (BID) | ORAL | Status: DC
  Administered 2024-05-18: 12.5 mg via ORAL
  Filled 2024-05-16 (×2): qty 1

## 2024-05-16 MED ORDER — CALCIUM POLYCARBOPHIL 625 MG PO TABS
625.0000 mg | ORAL_TABLET | Freq: Two times a day (BID) | ORAL | Status: DC
  Administered 2024-05-16 – 2024-05-18 (×5): 625 mg via ORAL
  Filled 2024-05-16 (×6): qty 1

## 2024-05-16 MED ORDER — CALCIUM POLYCARBOPHIL 625 MG PO TABS
1250.0000 mg | ORAL_TABLET | Freq: Two times a day (BID) | ORAL | Status: DC
  Filled 2024-05-16: qty 2

## 2024-05-16 MED ORDER — MAGIC MOUTHWASH
15.0000 mL | Freq: Four times a day (QID) | ORAL | Status: AC | PRN
  Filled 2024-05-16: qty 15

## 2024-05-16 MED ORDER — LACTATED RINGERS IV BOLUS
1000.0000 mL | Freq: Once | INTRAVENOUS | Status: AC
  Administered 2024-05-16: 1000 mL via INTRAVENOUS

## 2024-05-16 MED ORDER — WITCH HAZEL-GLYCERIN EX PADS: MEDICATED_PAD | CUTANEOUS | Status: DC | PRN

## 2024-05-16 MED ORDER — DIPHENHYDRAMINE HCL 12.5 MG/5ML PO ELIX
6.2500 mg | ORAL_SOLUTION | Freq: Four times a day (QID) | ORAL | Status: DC | PRN
Start: 2024-05-16 — End: 2024-06-04
  Administered 2024-05-27: 6.25 mg via ORAL
  Filled 2024-05-16: qty 5

## 2024-05-16 MED ORDER — TRAVASOL 10 % IV SOLN
INTRAVENOUS | Status: AC
  Filled 2024-05-16: qty 840

## 2024-05-16 MED ORDER — HYDROCODONE-ACETAMINOPHEN 5-325 MG PO TABS
1.0000 | ORAL_TABLET | ORAL | Status: DC | PRN
  Administered 2024-05-16 – 2024-05-18 (×3): 1 via ORAL
  Administered 2024-05-18 – 2024-05-19 (×2): 2 via ORAL
  Filled 2024-05-16: qty 2
  Filled 2024-05-16 (×2): qty 1
  Filled 2024-05-16: qty 2
  Filled 2024-05-16: qty 1

## 2024-05-16 NOTE — Progress Notes (Signed)
 [facility]  PROGRESS NOTE  ARMELIA PENTON ZOX:[id number] DOB: 09-14-1941 DOA: 05/08/2024 PCP: Anthon Kins, MD   Brief HPI:    83 year old female, lives independently, medical history significant for remote history of A-fib evaluated with a loop recorder which was negative, at some point was on anticoagulation, had some bleeding and this was discontinued, anemia, PACs, HTN, mild cognitive impairment, syncope, large bowel obstruction prompting colon resection, creation 09/2020 and at that hospitalization found to have left leg DVT, small PE when given recent surgery with inability to use anticoagulation, had IVC filter placed that was subsequently removed, underwent elective robotic rectosigmoid resection, colostomy takedown with bowel anastomosis, extensive lysis of additions on 5/29 after preop cardiac clearance.  On 5/30 even in, patient developed AMS after polypharmacy along with A-fib with RVR and [facility] was consulted.  Patient was transferred to stepdown unit.  Course complicated by A-fib with RVR, cardiology consulting, on IV beta-blockers.  Also hypotension, rectal bleed and syncope x 1 on 6/1, prolonged postop ileus, currently with NG tube, PICC line and TPN.    Assessment/Plan:   Postop A-fib with RVR/PVCs Remote history of A-fib not on rate control meds or anticoagulation preop Seen by Dr. Filiberto Hug in preop clearance on 04/08/2024 Likely stress response due to recent surgery, multiple meds, postop acute blood loss anemia. TTE with preserved LVEF.  TSH normal. Cardiology following, started on Toprol  XL 25 mg at bedtime Started on apixaban for anticoagulation - Apixaban on hold for recurrent lower GI bleeding likely at staple line  Acute blood loss anemia - Hemoglobin dropped to 6.1 today - 2 units PRBC ordered per general surgery - Follow CBC in a.m.  Hypotension Had SBP in the 70s on 6/1 afternoon. Suspected due to intravascular volume depletion Improved after IV fluid  bolus and maintenance IV fluids.     Acute kidney injury Resolved, likely in setting of hypotension   Syncope x 1 On 6/1 when patient was on bedside commode Suspected due to hypotension or vasovagal If patient drives, she must be counseled not to drive x 6 months. Echo with preserved LVEF Telemetry with A-fib with controlled ventricular rate.   Postop ileus Management per primary service 6/1: Placed NG tube, PICC line and start TPN. 6/2: Surgeons attempting NG tube clamping and clear liquids trial.   Urinary retention -Status post Foley placement on 6/4 -Plan for voiding trial on 6/7; if fails then Foley will be replaced and patient can follow-up with urology .   Hypomagnesemia Replaced Attempt to keep potassium >4 and magnesium  >2   Hypophosphatemia Replaced by pharmacy in TPN and monitor.   Leukocytosis May be stress response.  No clinical concern for infection apart from recent abdominal surgery-defer antibiotics for that to primary service. Completed IV azithromycin for GI dysmotility No clinical UTI.  Urine microscopy without concern for UTI.  Chest x-ray without pneumonia Fluctuating but overall stable   S/p colostomy takedown Management per primary service.     Mild delirium Ongoing on and off, multifactorial due to acute illness, recent surgery, meds, ICU/hospital delirium Delirium precautions Minimize opioids and sedatives.    Medications     sodium chloride    Intravenous Once   acetaminophen   500 mg Oral TID WC & HS   ascorbic acid   250 mg Oral Daily   aspirin  EC  81 mg Oral Daily   bismuth subsalicylate  30 mL Oral BID   Chlorhexidine  Gluconate Cloth  6 each Topical Daily   cyanocobalamin   1,000 mcg  Oral Daily   insulin  aspart  0-15 Units Subcutaneous Q8H   lactose free nutrition  237 mL Oral TID WC   methocarbamol   500 mg Oral TID   metoprolol  succinate  25 mg Oral QHS   multivitamin with minerals  1 tablet Oral Daily   pantoprazole  40 mg Oral  Daily   polycarbophil  1,250 mg Oral BID   sodium chloride  flush  10-40 mL Intracatheter Q12H   sodium chloride  flush  3 mL Intravenous Q12H   thiamine   100 mg Oral Daily     Data Reviewed:   CBG:  Recent Labs  Lab 05/14/24 1458 05/14/24 2337 05/15/24 0542 05/15/24 2008 05/16/24 0514  GLUCAP 144* 175* 131* 142* 145*    SpO2: 97 % O2 Flow Rate (L/min): 6 L/min    Vitals:   05/15/24 2012 05/16/24 0440 05/16/24 0452 05/16/24 0500  BP: (!) 104/56 (!) 120/50 (!) 120/49   Pulse:  (!) 101    Resp: 17  16   Temp: 98.2 F (36.8 C) 98.3 F (36.8 C) 98.3 F (36.8 C)   TempSrc: Oral Oral Oral   SpO2: 99% 99% 97%   Weight:    63 kg  Height:          Data Reviewed:  Basic Metabolic Panel: Recent Labs  Lab 05/10/24 0737 05/11/24 0233 05/12/24 0247 05/13/24 0521 05/14/24 0325 05/15/24 0339 05/16/24 0601  NA 132*   < > 133* 135 135 132* 132*  K 4.0   < > 4.3 3.7 3.5 3.4* 4.0  CL 103   < > 104 102 100 98 98  CO2 24   < > 20* 26 25 25 23   GLUCOSE 168*   < > 124* 140* 142* 128* 160*  BUN 27*   < > 47* 47* 34* 28* 36*  CREATININE 0.91   < > 1.07* 0.75 0.78 0.71 0.88  CALCIUM  8.3*   < > 8.9 8.7* 8.2* 7.9* 7.8*  MG 2.1  --  2.2 2.2 2.0 1.9  --   PHOS  --   --  3.1 2.2* 2.6 3.0  --    < > = values in this interval not displayed.    CBC: Recent Labs  Lab 05/09/24 1817 05/10/24 0257 05/12/24 0247 05/13/24 0521 05/13/24 1543 05/14/24 0325 05/16/24 0601  WBC 19.7*   < > 14.7* 15.1* 12.8* 16.0* 32.7*  NEUTROABS 17.0*  --   --   --   --   --   --   HGB 11.4*   < > 11.0* 8.7* 9.3* 8.9* 6.1*  HCT 35.5*   < > 33.5* 27.4* 29.8* 28.2* 19.6*  MCV 98.1   < > 96.0 97.9 101.0* 97.9 98.5  PLT 315   < > 353 360 398 401* 505*   < > = values in this interval not displayed.    LFT Recent Labs  Lab 05/13/24 0521 05/15/24 0339  AST 33 51*  ALT 25 55*  ALKPHOS 56 78  BILITOT 0.7 1.8*  PROT 5.6* 5.3*  ALBUMIN  2.1* 1.6*     Antibiotics: Anti-infectives (From  admission, onward)    Start     Dose/Rate Route Frequency Ordered Stop   05/09/24 0900  erythromycin  250 mg in sodium chloride  0.9 % 100 mL IVPB        250 mg 100 mL/hr over 60 Minutes Intravenous Every 8 hours 05/09/24 0732 05/11/24 0044   05/08/24 2200  cefoTEtan  (CEFOTAN ) 2 g in sodium chloride   0.9 % 100 mL IVPB        2 g 200 mL/hr over 30 Minutes Intravenous Every 12 hours 05/08/24 1759 05/09/24 0749   05/08/24 1400  neomycin  (MYCIFRADIN ) tablet 1,000 mg  Status:  Discontinued       Placed in "And" Linked Group   1,000 mg Oral 3 times per day 05/08/24 1119 05/08/24 1120   05/08/24 1400  metroNIDAZOLE  (FLAGYL ) tablet 1,000 mg  Status:  Discontinued       Placed in "And" Linked Group   1,000 mg Oral 3 times per day 05/08/24 1119 05/08/24 1120   05/08/24 1130  cefoTEtan  (CEFOTAN ) 2 g in sodium chloride  0.9 % 100 mL IVPB        2 g 200 mL/hr over 30 Minutes Intravenous On call to O.R. 05/08/24 1119 05/09/24 0749        Subjective   Complains of abdominal pain today.   Objective    Physical Examination:   Appears no acute distress S1-S2, regular Lungs clear to auscultation bilaterally Abdomen is soft, nontender   Status is: Inpatient:      Pressure Injury 10/08/23 Buttocks Right;Mid Stage 2 -  Partial thickness loss of dermis presenting as a shallow open injury with a red, pink wound bed without slough. (Active)  10/08/23 0800  Location: Buttocks  Location Orientation: Right;Mid  Staging: Stage 2 -  Partial thickness loss of dermis presenting as a shallow open injury with a red, pink wound bed without slough.  Wound Description (Comments):   Present on Admission:         Bernardine Langworthy S Ethelbert Thain   [facility] If 7PM-7AM, please contact night-coverage at [email or web address], Office  [phone]   05/16/2024, 10:38 AM  LOS: 8 days

## 2024-05-16 NOTE — Progress Notes (Signed)
 Updated by nurse Maureen Sour.  They are getting blood up in their and she is getting her first unit.  Blood pressure little soft.  I backed off of the metoprolol  to half the standing rate for now with threshold to hold.  Try and get IV fluids up.  Hold all anticoagulation.  Follow blood counts closely.  Hopefully just a temporary frustration of oozing that will calm down when she is further out.  I think this is a second episode where she had bleeding on anticoagulation.  Do not know if she really can tolerate it but we will have to see what cardiology medicine think.  Called discussed with the patient's granddaughter, Elizabeth Odom.  Questions answered.  She expressed appreciation.

## 2024-05-16 NOTE — Progress Notes (Signed)
 PHARMACY - TOTAL PARENTERAL NUTRITION CONSULT NOTE   Indication: Prolonged ileus  Patient Measurements: Height: 5\' 5"  (165.1 cm) Weight: 63 kg (138 lb 14.2 oz) IBW/kg (Calculated) : 57 TPN AdjBW (KG): 61.3 Body mass index is 23.11 kg/m.  Assessment: 34 yoF with history of diverticulitis with perforation and abscess. 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 She requested ostomy takedown and underwent LOA, colostomy takedown, small bowel resection with anastomosis on 5/29. Pharmacy is consulted to dose TPN starting 6/2 for postop ileus.  Glucose / Insulin : no Hx DM, A1c 5.1% -CBG goal 100-150. Range: 128-160 (3 units SSI/24 hrs) Electrolytes: Na (132) slightly low. Others WNL, including CorrCa (9.7) Renal: SCr <1; BUN elevated/increased Hepatic: AST/ALT, Tbili slightly elevated; alb low 1.6 I/O: no mIVF -UOP: 150 mL, Stool x4 -PO intake: 790 mL GI Imaging: none GI Surgeries / Procedures: -5/29: LAR, SBR, colostomy takedown, LOA  Central access: Double lumen PICC placed 6/2 TPN start date: 6/2  Nutritional Goals: -Goal TPN rate is 70 mL/hr (provides 84 g of protein and 1720 kcals per day) -Keep K >/= 4, Mag >/= 2 for ileus  RD Assessment:  Estimated Needs Total Energy Estimated Needs: 1600-1750 kcals Total Protein Estimated Needs: 80-95 grams Total Fluid Estimated Needs: >/= 1.6L  Current Nutrition:  Calorie count ongoing -Pt refusing Boost Plus supplements -No diet ordered currently. TPN.  Plan:  Now:  -Increase CBG checks and SSI to moderate q8h  At 18:00 Increase TPN to goal rate of 70 mL/hr Electrolytes in TPN: Increase Na Na 150 mEq/L, K 50 mEq/L, Ca 5 mEq/L, Mg 5 mEq/L, Phos 15 mmol/L.  Cl:Ac 1:1 Continue moderate SSI q8h Thiamine  100 mg daily x 5 days, last dose today MVI PO Monitor TPN labs on Mon/Thurs, recheck electrolytes with AM  labs tomorrow  Per surgeon, ok to cut TPN rate in half if calorie count meeting > 50% of needs.  Shireen Dory, PharmD, BCPS Clinical Pharmacist 05/16/2024 8:31 AM

## 2024-05-16 NOTE — Progress Notes (Signed)
 Patient continues to complain about feeling pressure in her stomach, prn medications have been given x3. This Clinical research associate offered to get patient up to sit on bedside commode to see if that would help her expel some gas to relieve the pressure, patient refused to get up and sit on bedside commode, stated, "that is not going to help me". Education provided about mobility and movement during post op care. Patient also refused to have SCD's placed back on. Plan of care ongoing.

## 2024-05-16 NOTE — Progress Notes (Signed)
 05/16/2024  Elizabeth Odom [id number] 1941/11/12  CARE TEAM: PCP: Anthon Kins, MD  Outpatient Care Team: Patient Care Team: Anthon Kins, MD as PCP - General (Internal Medicine) Cody Das, MD as PCP - Cardiology (Cardiology) Lajuan Pila, MD as Consulting Physician (Gastroenterology) Shela Derby, MD as Consulting Physician (General Surgery) Glory Larsen, MD (Neurology) Lahoma Pigg, MD as Consulting Physician (Urology) Hassan Links, MD as Consulting Physician (Cardiology) Candyce Champagne, MD as Consulting Physician (Colon and Rectal Surgery)  Inpatient Treatment Team: Treatment Team:  Candyce Champagne, MD Ruel Cotta, Heinz Llano, MD [facility], Rounding, MD Verlin Glory, MD Ozell Blunt, MD Chesley Cost, PTA Katheryn Pandy, LPN Ponce Brisker, NT Worischeck, Nancyann Aye, OT   Problem List:   Principal Problem:   Diverticular disease Active Problems:   A-fib (HCC)   Restless leg   ABLA (acute blood loss anemia)   Need for emotional support   Acute urinary retention   Mixed hyperlipidemia   Essential hypertension   Diverticular disease of left colon   History of DVT (deep vein thrombosis)   History of pulmonary embolus (PE)   Pre-diabetes   Presence of IVC filter   Stricture of sigmoid colon (HCC)   Protein-calorie malnutrition, severe   Hypokalemia   05/08/2024  POST-OPERATIVE DIAGNOSIS:   COLOSTOMY FOR RESECTION, DESIRE FOR OSTOMY TAKEDOWN RECTAL STRICTURE HISTORY OF DIVERTICULITIS WITH PERFORATION & ABSCESS   PROCEDURE:   -ROBOTIC RECTOSIGMOID RESECTION (LAR) -TAKEDOWN OF END COLOSTOMY WITH ANASTOMOSIS -RESECTION OF SMALL INTESTINE WITH ANASTOMOSIS -SMALL BOWEL REPAIR -LYSIS OF ADHESIONS x 115 MINUTES (66% OF CASE),  -INTRAOPERATIVE ASSESSMENT OF TISSUE VASCULAR PERFUSION USING ICG (indocyanine green ) IMMUNOFLUORESCENCE,  -TRANSVERSUS ABDOMINIS PLANE (TAP) BLOCK - BILATERAL -FLEXIBLE SIGMOIDOSCOPY   SURGEON:  Eddye Goodie, MD  OR FINDINGS:  Very dense adhesions of small bowel especially to pelvis and retroperitoneum.  Extremely concrete dense adhesions with friable tissue near her old prior small bowel anastomosis and distal jejunum.  Required small bowel repair and required small bowel resection including old anastomosis given severe tissues.  Patient had significant fibrotic stricturing of rectal stump at the mid/distal rectal junction.  Could not be released up.  Ended up doing resection of rectal stump to mid/distal rectal junction.  No obvious metastatic disease on visceral parietal peritoneum or liver.   It is a 29mm EEA anastomosis ( distal descending colon  connected to mid/distal rectal junction.)  It rests 7 cm from the anal verge by rigid proctoscopy.  Assessment [facility] Stay = 8 days) 8 Days Post-Op    Ileus appears to be resolving.  Recurrent hematochezia on Eliquis with drop in hemoglobin    Plan:  Hold anticoagulation for now given bleeding and hemoglobin 6.1.  Transfused 2 units.  Solid diet as tolerated.  Calorie counts.  TPN through PICC line.  If we can go to half plate today and gradually wean off if adequate calorie counts.  Partial urine retention status post Foley replacement 6/4.  Spoke with Dr Dulcy Gibney with Urology.  I think I will hold off on Flomax for now given her blood pressure unless she fails removal.  Remove on 6/7 Saturday morning.  If she fails that then replace Foley and have her follow-up with urology in a few weeks..    Transferred to surgery floor.  No rectal tubes.  Atrial fibrillation postop.  History of a prior event.    Cardiology following.    Started DOAC Eliquis 6/4.  Recurrent lower  GI bleeding most likely at staple line with evacuation of moderate clot today.  Follow hemoglobin.  I would hold  Rate controlled switch from amlodipine  to metoprolol .  Mildly tachycardic but they feel okay as long as heart rate less than 120 and blood pressure  okay.  Defer to Dr. Emmette Harms & cardiology team.  Pain control.  Challenge in this patient  Too sedated with morphine  & Dilaudid .  Fentanyl  for breakthrough at higher doses seems to be more successful.  Follow.    Scheduled Tylenol  low-dose to allow Norco for breakthrough since she cannot tolerate the other narcotics.    Add PO muscle relaxant.    Rehydration as tolerated.  Stop extra IV fluids and keep on the dry side.  -monitor electrolytes & replace as needed.  Potassium borderline.  Add oral supplementation. Keep K>4, Mg>2, Phos>3  -VTE prophylaxis- SCDs.  Anticoagulation as appropriate  -mobilize as tolerated to help recovery.  Enlist therapies in moderate/high risk patients as appropriate  I think she is stable enough to go back up to the floor.  Reasonable placed on telemetry to keep an eye on cardiac rate.  Cardiology and medicine help appreciated.  I updated the patient's status to the patient and nurse.  Discussed with [facility] internal medicine.  Asked Dr. Sherrod Dolphin to follow at least 1 or 2 more days until I get a send she is stable and can come out of stepdown.  Recommendations were made.  Questions were answered.  She expressed understanding & appreciation.  -Disposition: TBD      I reviewed last 24 h vitals and pain scores, last 48 h intake and output, last 24 h labs and trends, and last 24 h imaging results.  I have reviewed this patient's available data, including medical history, events of note, test results, etc as part of my evaluation.   A significant portion of that time was spent in counseling. Care during the described time interval was provided by me.  This care required moderate level of medical decision making.  05/16/2024    Subjective: (Chief complaint)  Patient with recurrent hematochezia.  Transferred to surgery floor.  Objective:  Vital signs:  Vitals:   05/15/24 2012 05/16/24 0440 05/16/24 0452 05/16/24 0500  BP: (!) 104/56 (!) 120/50 (!) 120/49    Pulse:  (!) 101    Resp: 17  16   Temp: 98.2 F (36.8 C) 98.3 F (36.8 C) 98.3 F (36.8 C)   TempSrc: Oral Oral Oral   SpO2: 99% 99% 97%   Weight:    63 kg  Height:        Last BM Date : 05/15/24  Intake/Output   Yesterday:  06/05 0701 - 06/06 0700 In: 590 [P.O.:590] Out: 150 [Urine:150] This shift:  Total I/O In: 590 [P.O.:590] Out: 150 [Urine:150]  Bowel function:  Flatus: YES  BM:  YES  Drain: (No drain)    Physical Exam:  General: Pt sleeping to awaken and mild discomfort but no major distress  Eyes: PERRL, normal EOM.  Sclera clear.  No icterus Neuro: CN II-XII intact w/o focal sensory/motor deficits. Lymph: No head/neck/groin lymphadenopathy Psych:  No delerium/psychosis/paranoia.  Oriented x 4 HENT: Normocephalic, Mucus membranes moist.  No thrush Neck: Supple, No tracheal deviation.  No obvious thyromegaly Chest: No pain to chest wall compression.  Good respiratory excursion.  No audible wheezing CV:  Pulses intact.  Regular rhythm.  No major extremity edema MS: Normal AROM mjr joints.  No obvious deformity  Abdomen:  Soft.  Mildy distended.  Nontender.   Old ostomy incision with normal healing ridge.  Right lower quadrant old drain site with minimal serous drainage  no evidence of peritonitis.  No incarcerated hernias.  Rectal: Large clot in rectal vault that I evacuated.  All old blood.  No active bleeding at this time.  Sphincter tone mildly decreased but intact.  I feel that with dehiscence of the EEA staple line at the tip of my finger.  Ext:   No deformity.  No mjr edema.  No cyanosis Skin: No petechiae / purpurea.  No major sores.  Warm and dry    Results:   Cultures: Recent Results (from the past 720 hours)  Urine Culture     Status: Abnormal   Collection Time: 05/09/24  8:30 PM   Specimen: Urine, Clean Catch  Result Value Ref Range Status   Specimen Description   Final    URINE, CLEAN CATCH Performed at [facility], [address], [place], [place] [address]    Special Requests   Final    NONE Performed at [facility], [address], [place], [place] [address]    Culture (A)  Final    <10,000 COLONIES/mL INSIGNIFICANT GROWTH Performed at [facility], [address] [place], [place] [address]    Report Status 05/11/2024 FINAL  Final    Labs: Results for orders placed or performed during the hospital encounter of 05/08/24 (from the past 48 hours)  Glucose, capillary     Status: Abnormal   Collection Time: 05/14/24  2:58 PM  Result Value Ref Range   Glucose-Capillary 144 (H) 70 - 99 mg/dL    Comment: Glucose reference range applies only to samples taken after fasting for at least 8 hours.  Glucose, capillary     Status: Abnormal   Collection Time: 05/14/24 11:37 PM  Result Value Ref Range   Glucose-Capillary 175 (H) 70 - 99 mg/dL    Comment: Glucose reference range applies only to samples taken after fasting for at least 8 hours.  Comprehensive metabolic panel     Status: Abnormal   Collection Time: 05/15/24  3:39 AM  Result Value Ref Range   Sodium 132 (L) 135 - 145 mmol/L   Potassium 3.4 (L) 3.5 - 5.1 mmol/L   Chloride 98 98 - 111 mmol/L   CO2 25 22 - 32 mmol/L   Glucose, Bld 128 (H) 70 - 99 mg/dL    Comment: Glucose reference range applies only to samples taken after fasting for at least 8 hours.   BUN 28 (H) 8 - 23 mg/dL   Creatinine, Ser 9.14 0.44 - 1.00 mg/dL   Calcium  7.9 (L) 8.9 - 10.3 mg/dL   Total Protein 5.3 (L) 6.5 - 8.1 g/dL   Albumin  1.6 (L) 3.5 - 5.0 g/dL   AST 51 (H) 15 - 41 U/L   ALT 55 (H) 0 - 44 U/L   Alkaline Phosphatase 78 38 - 126 U/L   Total Bilirubin 1.8 (H) 0.0 - 1.2 mg/dL   GFR, Estimated >78 >29 mL/min    Comment: (NOTE) Calculated using the CKD-EPI Creatinine Equation (2021)    Anion gap 9 5 - 15    Comment: Performed at [facility], [address] [place], [place] [address]  Magnesium       Status: None   Collection Time: 05/15/24  3:39 AM  Result Value Ref Range   Magnesium  1.9 1.7 - 2.4  mg/dL    Comment: Performed at [facility], [address], [place], [place] [address]  Phosphorus     Status: None   Collection Time: 05/15/24  3:39 AM  Result Value Ref Range   Phosphorus 3.0 2.5 - 4.6 mg/dL    Comment: Performed at [facility], [address] [place], [place] [address]  Glucose, capillary     Status: Abnormal   Collection Time: 05/15/24  5:42 AM  Result Value Ref Range   Glucose-Capillary 131 (H) 70 - 99 mg/dL    Comment: Glucose reference range applies only to samples taken after fasting for at least 8 hours.  Glucose, capillary     Status: Abnormal   Collection Time: 05/15/24  8:08 PM  Result Value Ref Range   Glucose-Capillary 142 (H) 70 - 99 mg/dL    Comment: Glucose reference range applies only to samples taken after fasting for at least 8 hours.  Glucose, capillary     Status: Abnormal   Collection Time: 05/16/24  5:14 AM  Result Value Ref Range   Glucose-Capillary 145 (H) 70 - 99 mg/dL    Comment: Glucose reference range applies only to samples taken after fasting for at least 8 hours.    Imaging / Studies: No results found.   Medications / Allergies: per chart  Antibiotics: Anti-infectives (From admission, onward)    Start     Dose/Rate Route Frequency Ordered Stop   05/09/24 0900  erythromycin  250 mg in sodium chloride  0.9 % 100 mL IVPB        250 mg 100 mL/hr over 60 Minutes Intravenous Every 8 hours 05/09/24 0732 05/11/24 0044   05/08/24 2200  cefoTEtan  (CEFOTAN ) 2 g in sodium chloride  0.9 % 100 mL IVPB        2 g 200 mL/hr over 30 Minutes Intravenous Every 12 hours 05/08/24 1759 05/09/24 0749   05/08/24 1400  neomycin  (MYCIFRADIN ) tablet 1,000 mg  Status:  Discontinued       Placed in "And" Linked Group   1,000 mg Oral 3 times per day 05/08/24 1119 05/08/24 1120   05/08/24 1400  metroNIDAZOLE  (FLAGYL )  tablet 1,000 mg  Status:  Discontinued       Placed in "And" Linked Group   1,000 mg Oral 3 times per day 05/08/24 1119 05/08/24 1120   05/08/24 1130  cefoTEtan  (CEFOTAN ) 2 g in sodium chloride  0.9 % 100 mL IVPB        2 g 200 mL/hr over 30 Minutes Intravenous On call to O.R. 05/08/24 1119 05/09/24 0749         Note: Portions of this report may have been transcribed using voice recognition software. Every effort was made to ensure accuracy; however, inadvertent computerized transcription errors may be present.   Any transcriptional errors that result from this process are unintentional.    Eddye Goodie, MD, FACS, MASCRS Esophageal, Gastrointestinal & Colorectal Surgery Robotic and Minimally Invasive Surgery  [facility] [address], [address] [place], [place] [address] [phone] Fax [phone] Main  CONTACT INFORMATION: Weekday (9AM-5PM): Call [facility] main office at [phone] Weeknight (5PM-9AM) or Weekend/Holiday: Check EPIC "Web Links" tab & use "AMION" (password " [email or web address]") for General Surgery [facility] coverage  Please, DO NOT use SecureChat  (it is not reliable communication to reach operating surgeons & will lead to a delay in care).   Epic staff messaging available for outptient concerns needing 1-2  business day response.      05/16/2024  6:22 AM

## 2024-05-16 NOTE — Progress Notes (Signed)
 No new recommendation from yesterday

## 2024-05-16 NOTE — Plan of Care (Signed)
  Problem: Bowel/Gastric: Goal: Gastrointestinal status for postoperative course will improve Outcome: Progressing   Problem: Clinical Measurements: Goal: Postoperative complications will be avoided or minimized Outcome: Progressing   Problem: Respiratory: Goal: Respiratory status will improve Outcome: Progressing   Problem: Skin Integrity: Goal: Will show signs of wound healing Outcome: Progressing

## 2024-05-16 NOTE — Progress Notes (Signed)
   05/16/24 1145  Assess: MEWS Score  Temp 98.4 F (36.9 C)  BP (!) 94/46  Pulse Rate (!) 104  Resp 16  Level of Consciousness Alert  SpO2 100 %  O2 Device Room Air  Assess: MEWS Score  MEWS Temp 0  MEWS Systolic 1  MEWS Pulse 1  MEWS RR 0  MEWS LOC 0  MEWS Score 2  MEWS Score Color Yellow  Assess: if the MEWS score is Yellow or Red  Were vital signs accurate and taken at a resting state? Yes  Does the patient meet 2 or more of the SIRS criteria? No  MEWS guidelines implemented  Yes, yellow  Treat  MEWS Interventions Considered administering scheduled or prn medications/treatments as ordered  Take Vital Signs  Increase Vital Sign Frequency  Yellow: Q2hr x1, continue Q4hrs until patient remains green for 12hrs  Escalate  MEWS: Escalate Yellow: Discuss with charge nurse and consider notifying provider and/or RRT  Notify: Charge Nurse/RN  Name of Charge Nurse/RN Notified Blanca Bunch  Assess: SIRS CRITERIA  SIRS Temperature  0  SIRS Respirations  0  SIRS Pulse 1  SIRS WBC 1  SIRS Score Sum  2

## 2024-05-16 NOTE — Progress Notes (Signed)
 Dr. Elvan Hamel made aware via phone that pt had passed another large blood clot from her rectum. Pt on second unit of PRBCs and doing well. She has perked up with better color in her face, HR is lower and extremities have warmed up. MD aware that BP remains low 80's over 40's yet pt asymptomatic. Dr. Elvan Hamel ordered a CBC for 2100 after blood, and he instructed me to have night shift nurse to let him know results.

## 2024-05-16 NOTE — Progress Notes (Signed)
 PT Cancellation Note  Patient Details Name: Elizabeth Odom MRN: [id number] DOB: August 13, 1941   Cancelled Treatment:     Per chart review, rectal bleeding with drop in HgB 6.1.  Will hold off Physical Therapy for today.  Rehab Team to continue to follow and see another day.   Bess Broody  PTA Acute  Rehabilitation Services Office M-F          [phone]

## 2024-05-16 NOTE — Progress Notes (Addendum)
   05/16/24 0981  Provider Notification  Provider Name/Title Dr. Vann Gent  Date Provider Notified 05/16/24  Time Provider Notified [phone]  Method of Notification Page  Notification Reason Critical Result  Test performed and critical result HBG 6.1  Date Critical Result Received 05/16/24  Time Critical Result Received 0705  Provider response Evaluate remotely   Called via answering service at [phone] spoke with Landon Pinion.

## 2024-05-16 NOTE — Progress Notes (Addendum)
 Updated, MD on call Dr. Andy Bannister  via [facility] page about patient having more bleeding from the rectum with medium sized clots.Most recent vitals are 98.3, 120/50, 101, 16, 99 RA. IV team to draw CBC this am.

## 2024-05-16 NOTE — Progress Notes (Signed)
 OT Cancellation Note  Patient Details Name: KIELY COUSAR MRN: [id number] DOB: [birth year]   Cancelled Treatment:    Reason Eval/Treat Not Completed: Medical issues which prohibited therapy. Chart reviewed, pt currently with low BP at 86/45 and drop in HgB at 6.1. Will hold OT tx this date and re-attempt when appropriate.   Taevyn Hausen L. Sulay Brymer, OTR/L  05/16/24, 12:43 PM

## 2024-05-16 NOTE — Progress Notes (Signed)
 Nutrition Follow-up  DOCUMENTATION CODES:   Severe malnutrition in context of chronic illness  INTERVENTION:   -Continue TPN management per Pharmacy -Advance to goal as pt is now not on diet and is not meeting needs PO  -D/c Calorie count as pt is not on diet  Once diet advanced resume: -Boost Plus TID- Each supplement provides 360kcal and 14g protein. -Magic cup BID with meals, each supplement provides 290 kcal and 9 grams of protein      NUTRITION DIAGNOSIS:   Severe Malnutrition related to chronic illness as evidenced by severe fat depletion, severe muscle depletion.  Ongoing.  GOAL:   Patient will meet greater than or equal to 90% of their needs  Progressing with TPN  MONITOR:   Diet advancement, Labs, Weight trends  ASSESSMENT:   83 year old female with PMH HTN, mild cognitive impairment, asd large bowel obstruction (October 2024) s/p Hartman's resection and colostomy creation who was admitted 5/29 for colostomy reversal.  Day 2 result of Calorie Count attempted but envelope lost d/t floor transfer to 3E. Per [facility], pt only ordered lunch yesterday despite being on meal order with assist. As of this morning, pt is not on a diet. Per nurse note, pt with bleeding from rectum. Will d/c Calorie Count for right now. Pharmacy to increase TPN as pt is not meeting needs and is not currently on a diet.   TPN advancing to 70 ml/hr tonight, providing 1719 kcals and 84g protein.  Admission weight: 127 lbs Current weight: 138 lbs  Medications: Vitamin C , Vitamin B-12, Multivitamin with minerals daily, Fibercon, Thiamine   Labs reviewed: CBGs: 131-145 Low Na   Diet Order:   Diet Order             Diet - low sodium heart healthy                   EDUCATION NEEDS:   Education needs have been addressed  Skin:  Skin Assessment: Skin Integrity Issues: Skin Integrity Issues:: Stage II, Incisions Stage II: Right Buttocks Incisions: Abdomen  Last BM:  6/5  -type 5  Height:   Ht Readings from Last 1 Encounters:  05/09/24 5\' 5"  (1.651 m)    Weight:   Wt Readings from Last 1 Encounters:  05/16/24 63 kg    BMI:  Body mass index is 23.11 kg/m.  Estimated Nutritional Needs:   Kcal:  1600-1750 kcals  Protein:  80-95 grams  Fluid:  >/= 1.6L   Arna Better, MS, RD, LDN Inpatient Clinical Dietitian Contact via Secure chat

## 2024-05-16 NOTE — Progress Notes (Signed)
 Dr Hershell Lose aware via phone pt continues to ooze blood from rectum and has a visible clot at entrance of rectum. Md recommended Give PRBC's stat when ready and ordered Bolus of LR in the meantime.

## 2024-05-17 DIAGNOSIS — I480 Paroxysmal atrial fibrillation: Secondary | ICD-10-CM

## 2024-05-17 DIAGNOSIS — I4819 Other persistent atrial fibrillation: Secondary | ICD-10-CM | POA: Diagnosis not present

## 2024-05-17 DIAGNOSIS — K5790 Diverticulosis of intestine, part unspecified, without perforation or abscess without bleeding: Secondary | ICD-10-CM | POA: Diagnosis not present

## 2024-05-17 DIAGNOSIS — D62 Acute posthemorrhagic anemia: Secondary | ICD-10-CM | POA: Diagnosis not present

## 2024-05-17 LAB — CBC
HCT: 20.9 % — ABNORMAL LOW (ref 36.0–46.0)
HCT: 22.2 % — ABNORMAL LOW (ref 36.0–46.0)
Hemoglobin: 6.9 g/dL — CL (ref 12.0–15.0)
Hemoglobin: 7.3 g/dL — ABNORMAL LOW (ref 12.0–15.0)
MCH: 29.2 pg (ref 26.0–34.0)
MCH: 29.3 pg (ref 26.0–34.0)
MCHC: 32.9 g/dL (ref 30.0–36.0)
MCHC: 33 g/dL (ref 30.0–36.0)
MCV: 88.6 fL (ref 80.0–100.0)
MCV: 89.2 fL (ref 80.0–100.0)
Platelets: 393 10*3/uL (ref 150–400)
Platelets: 428 10*3/uL — ABNORMAL HIGH (ref 150–400)
RBC: 2.36 MIL/uL — ABNORMAL LOW (ref 3.87–5.11)
RBC: 2.49 MIL/uL — ABNORMAL LOW (ref 3.87–5.11)
RDW: 17.7 % — ABNORMAL HIGH (ref 11.5–15.5)
RDW: 18 % — ABNORMAL HIGH (ref 11.5–15.5)
WBC: 21.7 10*3/uL — ABNORMAL HIGH (ref 4.0–10.5)
WBC: 24.4 10*3/uL — ABNORMAL HIGH (ref 4.0–10.5)
nRBC: 0.2 % (ref 0.0–0.2)
nRBC: 0.4 % — ABNORMAL HIGH (ref 0.0–0.2)

## 2024-05-17 LAB — BASIC METABOLIC PANEL WITH GFR
Anion gap: 10 (ref 5–15)
BUN: 46 mg/dL — ABNORMAL HIGH (ref 8–23)
CO2: 25 mmol/L (ref 22–32)
Calcium: 7.3 mg/dL — ABNORMAL LOW (ref 8.9–10.3)
Chloride: 98 mmol/L (ref 98–111)
Creatinine, Ser: 0.99 mg/dL (ref 0.44–1.00)
GFR, Estimated: 57 mL/min — ABNORMAL LOW (ref >60)
Glucose, Bld: 134 mg/dL — ABNORMAL HIGH (ref 70–99)
Potassium: 3.8 mmol/L (ref 3.5–5.1)
Sodium: 133 mmol/L — ABNORMAL LOW (ref 135–145)

## 2024-05-17 LAB — MAGNESIUM: Magnesium: 2 mg/dL (ref 1.7–2.4)

## 2024-05-17 LAB — GLUCOSE, CAPILLARY
Glucose-Capillary: 133 mg/dL — ABNORMAL HIGH (ref 70–99)
Glucose-Capillary: 141 mg/dL — ABNORMAL HIGH (ref 70–99)
Glucose-Capillary: 143 mg/dL — ABNORMAL HIGH (ref 70–99)
Glucose-Capillary: 149 mg/dL — ABNORMAL HIGH (ref 70–99)

## 2024-05-17 LAB — PREPARE RBC (CROSSMATCH)

## 2024-05-17 LAB — PHOSPHORUS: Phosphorus: 3.4 mg/dL (ref 2.5–4.6)

## 2024-05-17 MED ORDER — TRAVASOL 10 % IV SOLN
INTRAVENOUS | Status: AC
  Filled 2024-05-17: qty 840

## 2024-05-17 MED ORDER — SODIUM CHLORIDE 0.9% IV SOLUTION: Freq: Once | INTRAVENOUS | Status: AC

## 2024-05-17 MED ORDER — POTASSIUM CHLORIDE 20 MEQ PO PACK
20.0000 meq | PACK | Freq: Once | ORAL | Status: AC
  Administered 2024-05-17: 20 meq via ORAL
  Filled 2024-05-17: qty 1

## 2024-05-17 NOTE — Plan of Care (Signed)
  Problem: Bowel/Gastric: Goal: Gastrointestinal status for postoperative course will improve Outcome: Progressing   Problem: Nutritional: Goal: Will attain and maintain optimal nutritional status will improve Outcome: Progressing   Problem: Clinical Measurements: Goal: Postoperative complications will be avoided or minimized Outcome: Progressing   Problem: Nutrition: Goal: Adequate nutrition will be maintained Outcome: Progressing

## 2024-05-17 NOTE — Progress Notes (Signed)
 PHARMACY - TOTAL PARENTERAL NUTRITION CONSULT NOTE   Indication: Prolonged ileus  Patient Measurements: Height: 5\' 5"  (165.1 cm) Weight: 60.7 kg (133 lb 13.1 oz) IBW/kg (Calculated) : 57 TPN AdjBW (KG): 61.3 Body mass index is 22.27 kg/m.  Assessment: 13 yoF with history of diverticulitis with perforation and abscess. 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 She requested ostomy takedown and underwent LOA, colostomy takedown, small bowel resection with anastomosis on 5/29. Pharmacy is consulted to dose TPN starting 6/2 for postop ileus.  Glucose / Insulin : no Hx DM, A1c 5.1% -CBG goal 100-150. Range: 128-160 (2 units SSI/24 hrs) Electrolytes: Na (133) remains slightly low on max concentration of Na in TPN.  -All Others WNL, including CorrCa (9.2) Renal: SCr <1; BUN rising Hepatic: AST/ALT, Tbili slightly elevated; alb low 1.6 I/O: no mIVF -UOP: 1500 mL, Stool x3 -PO intake: 660 mL GI Imaging: none GI Surgeries / Procedures: -5/29: LAR, SBR, colostomy takedown, LOA  Central access: Double lumen PICC placed 6/2 TPN start date: 6/2  Nutritional Goals: -Goal TPN rate is 70 mL/hr (provides 84 g of protein and 1720 kcals per day) -Keep K >/= 4, Mag >/= 2 for ileus  RD Assessment:  Estimated Needs Total Energy Estimated Needs: 1600-1750 kcals Total Protein Estimated Needs: 80-95 grams Total Fluid Estimated Needs: >/= 1.6L  Current Nutrition:  Calorie count ongoing -Pt refusing all Boost Plus supplements -Diet: soft  Plan:  Now:  -KCl 20 mEq PO once  At 18:00 Continue TPN at goal rate of 70 mL/hr Electrolytes in TPN: No changes Na 150 mEq/L (max), K 50 mEq/L, Ca 5 mEq/L, Mg 5 mEq/L, Phos 15 mmol/L.  Cl:Ac 1:1 Continue moderate SSI q8h MVI PO Monitor TPN labs on Mon/Thurs, recheck electrolytes with AM labs tomorrow  Per surgeon, ok to cut TPN rate  in half if calorie count meeting > 50% of needs.  Shireen Dory, PharmD, BCPS Clinical Pharmacist 05/17/2024 8:54 AM

## 2024-05-17 NOTE — Progress Notes (Signed)
 9 Days Post-Op   Subjective/Chief Complaint: Complains of hurting all over. Large bm this am. Didn't look like blood   Objective: Vital signs in last 24 hours: Temp:  [97.7 F (36.5 C)-99.6 F (37.6 C)] 98.4 F (36.9 C) (06/07 0945) Pulse Rate:  [99-112] 111 (06/07 1000) Resp:  [16-20] 18 (06/07 0614) BP: (82-100)/(43-54) 95/47 (06/07 1000) SpO2:  [94 %-100 %] 99 % (06/07 0945) Weight:  [60.7 kg] 60.7 kg (06/07 0500) Last BM Date : 05/16/24  Intake/Output from previous day: 06/06 0701 - 06/07 0700 In: 1630.5 [P.O.:660; I.V.:250; Blood:720.5] Out: 1500 [Urine:1500] Intake/Output this shift: No intake/output data recorded.  General appearance: alert and cooperative Resp: clear to auscultation bilaterally Cardio: irregularly irregular rhythm GI: distended and tender  Lab Results:  Recent Labs    05/16/24 0601 05/17/24 0008  WBC 32.7* 24.4*  HGB 6.1* 7.3*  HCT 19.6* 22.2*  PLT 505* 393   BMET Recent Labs    05/16/24 0601 05/17/24 0008  NA 132* 133*  K 4.0 3.8  CL 98 98  CO2 23 25  GLUCOSE 160* 134*  BUN 36* 46*  CREATININE 0.88 0.99  CALCIUM  7.8* 7.3*   PT/INR No results for input(s): "LABPROT", "INR" in the last 72 hours. ABG No results for input(s): "PHART", "HCO3" in the last 72 hours.  Invalid input(s): "PCO2", "PO2"  Studies/Results: No results found.  Anti-infectives: Anti-infectives (From admission, onward)    Start     Dose/Rate Route Frequency Ordered Stop   05/09/24 0900  erythromycin  250 mg in sodium chloride  0.9 % 100 mL IVPB        250 mg 100 mL/hr over 60 Minutes Intravenous Every 8 hours 05/09/24 0732 05/11/24 0044   05/08/24 2200  cefoTEtan  (CEFOTAN ) 2 g in sodium chloride  0.9 % 100 mL IVPB        2 g 200 mL/hr over 30 Minutes Intravenous Every 12 hours 05/08/24 1759 05/09/24 0749   05/08/24 1400  neomycin  (MYCIFRADIN ) tablet 1,000 mg  Status:  Discontinued       Placed in "And" Linked Group   1,000 mg Oral 3 times per day  05/08/24 1119 05/08/24 1120   05/08/24 1400  metroNIDAZOLE  (FLAGYL ) tablet 1,000 mg  Status:  Discontinued       Placed in "And" Linked Group   1,000 mg Oral 3 times per day 05/08/24 1119 05/08/24 1120   05/08/24 1130  cefoTEtan  (CEFOTAN ) 2 g in sodium chloride  0.9 % 100 mL IVPB        2 g 200 mL/hr over 30 Minutes Intravenous On call to O.R. 05/08/24 1119 05/09/24 0749       Assessment/Plan: s/p Procedure(s) with comments: CLOSURE, COLOSTOMY, ROBOT-ASSISTED -ROBOTIC RECTOSIGMOID RESECTION (LAR) -TAKEDOWN OF END COLOSTOMY WITH ANASTOMOSIS -RESECTION OF SMALL INTESTINE WITH ANASTOMOSIS -SMALL BOWEL REPAIR -LYSIS OF ADHESIONS x 115 MINUTES (66% OF CASE),  -INTRAOPERATIVE ASSESSMENT OF TISSUE VASCULAR PERFUSION USING ICG (indocyanine green ) IMMUNOFLUORESCENCE,  -TRANSVERSUS ABDOMINIS PLANE (TAP) BLOCK - BILATERAL (N/A) - ROBOTIC OSTOMY TAKEDOWN LYSIS, ADHESIONS, LAPAROSCOPIC (N/A) - LYSIS OF ADHESIONS SIGMOIDOSCOPY, FLEXIBLE (N/A) CYSTOSCOPY WITH INDOCYANINE GREEN  IMAGING (ICG) (N/A) Advance diet. Continue liquids until distension improves Ileus Hold anticoagulation. Recheck hg and transfuse Afib. Cards following Wbc elevated. monitor  LOS: 9 days    Elizabeth Odom 05/17/2024

## 2024-05-17 NOTE — Progress Notes (Signed)
   05/17/24 0945  Assess: MEWS Score  Temp 98.4 F (36.9 C)  BP (!) 95/47  MAP (mmHg) (!) 61  Pulse Rate (!) 111  SpO2 99 %  O2 Device Room Air  Assess: MEWS Score  MEWS Temp 0  MEWS Systolic 1  MEWS Pulse 2  MEWS RR 0  MEWS LOC 0  MEWS Score 3  MEWS Score Color Yellow  Assess: if the MEWS score is Yellow or Red  Were vital signs accurate and taken at a resting state? Yes  Does the patient meet 2 or more of the SIRS criteria? No  MEWS guidelines implemented  No, previously yellow, continue vital signs every 4 hours  Notify: Charge Nurse/RN  Name of Charge Nurse/RN Notified [facility]  Provider Notification  Provider Name/Title dr Alethea Andes  Date Provider Notified 05/17/24  Time Provider Notified 0957  Method of Notification Rounds  Notification Reason Other (Comment) (Yellow mews)  Provider response See new orders  Date of Provider Response 05/17/24  Time of Provider Response 0958  Assess: SIRS CRITERIA  SIRS Temperature  0  SIRS Respirations  0  SIRS Pulse 1  SIRS WBC 0  SIRS Score Sum  1

## 2024-05-17 NOTE — Progress Notes (Signed)
 [facility]  PROGRESS NOTE  Elizabeth Odom UYQ:[id number] DOB: [birth year] DOA: 05/08/2024 PCP: Anthon Kins, MD   Brief HPI:    83 year old female, lives independently, medical history significant for remote history of A-fib evaluated with a loop recorder which was negative, at some point was on anticoagulation, had some bleeding and this was discontinued, anemia, PACs, HTN, mild cognitive impairment, syncope, large bowel obstruction prompting colon resection, creation 09/2020 and at that hospitalization found to have left leg DVT, small PE when given recent surgery with inability to use anticoagulation, had IVC filter placed that was subsequently removed, underwent elective robotic rectosigmoid resection, colostomy takedown with bowel anastomosis, extensive lysis of additions on 5/29 after preop cardiac clearance.  On 5/30 even in, patient developed AMS after polypharmacy along with A-fib with RVR and TRH was consulted.  Patient was transferred to stepdown unit.  Course complicated by A-fib with RVR, cardiology consulting, on IV beta-blockers.  Also hypotension, rectal bleed and syncope x 1 on 6/1, prolonged postop ileus, currently with NG tube, PICC line and TPN.    Assessment/Plan:   Postop A-fib with RVR/PVCs Remote history of A-fib not on rate control meds or anticoagulation preop Seen by Dr. Filiberto Hug in preop clearance on 04/08/2024 Likely stress response due to recent surgery, multiple meds, postop acute blood loss anemia. TTE with preserved LVEF.  TSH normal. Cardiology following, started on Toprol  XL 25 mg at bedtime Started on apixaban  for anticoagulation - Apixaban  on hold for recurrent lower GI bleeding   Acute blood loss anemia - Hemoglobin dropped to 6.9 -1 extra unit of PRBC ordered this afternoon -Follow CBC in a.m.  Hypotension Had SBP in the 70s on 6/1 afternoon. Suspected due to intravascular volume depletion Improved after IV fluid bolus and maintenance IV  fluids.     Acute kidney injury Resolved, likely in setting of hypotension   Syncope x 1 On 6/1 when patient was on bedside commode Suspected due to hypotension or vasovagal If patient drives, she must be counseled not to drive x 6 months. Echo with preserved LVEF Telemetry with A-fib with controlled ventricular rate.   Postop ileus Management per primary service 6/1: Placed NG tube, PICC line and start TPN. 6/2: Surgeons attempting NG tube clamping and clear liquids trial.   Urinary retention -Status post Foley placement on 6/4 -Plan for voiding trial on 6/7; if fails then Foley will be replaced and patient can follow-up with urology .   Hypomagnesemia Replaced Attempt to keep potassium >4 and magnesium  >2   Hypophosphatemia Replaced by pharmacy in TPN and monitor.   Leukocytosis May be stress response.  No clinical concern for infection apart from recent abdominal surgery-defer antibiotics for that to primary service. Completed IV azithromycin for GI dysmotility No clinical UTI.  Urine microscopy without concern for UTI.  Chest x-ray without pneumonia Fluctuating but overall stable   S/p colostomy takedown Management per primary service.     Mild delirium Ongoing on and off, multifactorial due to acute illness, recent surgery, meds, ICU/hospital delirium Delirium precautions Minimize opioids and sedatives.    Medications     acetaminophen   500 mg Oral TID WC & HS   ascorbic acid   250 mg Oral Daily   Chlorhexidine  Gluconate Cloth  6 each Topical Daily   cyanocobalamin   1,000 mcg Oral Daily   insulin  aspart  0-15 Units Subcutaneous Q8H   lactose free nutrition  237 mL Oral TID WC   liver oil-zinc  oxide   Topical BID  methocarbamol   500 mg Oral TID   metoprolol  tartrate  12.5 mg Oral BID   multivitamin with minerals  1 tablet Oral Daily   pantoprazole   40 mg Oral Daily   polycarbophil  625 mg Oral BID   sodium chloride  flush  10-40 mL Intracatheter Q12H    sodium chloride  flush  3 mL Intravenous Q12H     Data Reviewed:   CBG:  Recent Labs  Lab 05/15/24 2008 05/16/24 0514 05/16/24 1648 05/17/24 0017 05/17/24 0801  GLUCAP 142* 145* 144* 141* 133*    SpO2: 95 % O2 Flow Rate (L/min): 6 L/min    Vitals:   05/16/24 2158 05/17/24 0159 05/17/24 0500 05/17/24 0614  BP: (!) 100/54 (!) 90/47  (!) 96/54  Pulse: 100 100  99  Resp: 20 19  18   Temp: 97.7 F (36.5 C) 98.8 F (37.1 C)  99.6 F (37.6 C)  TempSrc: Oral Oral  Oral  SpO2: 98% 97%  95%  Weight:   60.7 kg   Height:          Data Reviewed:  Basic Metabolic Panel: Recent Labs  Lab 05/12/24 0247 05/13/24 0521 05/14/24 0325 05/15/24 0339 05/16/24 0601 05/17/24 0008  NA 133* 135 135 132* 132* 133*  K 4.3 3.7 3.5 3.4* 4.0 3.8  CL 104 102 100 98 98 98  CO2 20* 26 25 25 23 25   GLUCOSE 124* 140* 142* 128* 160* 134*  BUN 47* 47* 34* 28* 36* 46*  CREATININE 1.07* 0.75 0.78 0.71 0.88 0.99  CALCIUM  8.9 8.7* 8.2* 7.9* 7.8* 7.3*  MG 2.2 2.2 2.0 1.9  --  2.0  PHOS 3.1 2.2* 2.6 3.0  --  3.4    CBC: Recent Labs  Lab 05/13/24 0521 05/13/24 1543 05/14/24 0325 05/16/24 0601 05/17/24 0008  WBC 15.1* 12.8* 16.0* 32.7* 24.4*  HGB 8.7* 9.3* 8.9* 6.1* 7.3*  HCT 27.4* 29.8* 28.2* 19.6* 22.2*  MCV 97.9 101.0* 97.9 98.5 89.2  PLT 360 398 401* 505* 393    LFT Recent Labs  Lab 05/13/24 0521 05/15/24 0339  AST 33 51*  ALT 25 55*  ALKPHOS 56 78  BILITOT 0.7 1.8*  PROT 5.6* 5.3*  ALBUMIN  2.1* 1.6*     Antibiotics: Anti-infectives (From admission, onward)    Start     Dose/Rate Route Frequency Ordered Stop   05/09/24 0900  erythromycin  250 mg in sodium chloride  0.9 % 100 mL IVPB        250 mg 100 mL/hr over 60 Minutes Intravenous Every 8 hours 05/09/24 0732 05/11/24 0044   05/08/24 2200  cefoTEtan  (CEFOTAN ) 2 g in sodium chloride  0.9 % 100 mL IVPB        2 g 200 mL/hr over 30 Minutes Intravenous Every 12 hours 05/08/24 1759 05/09/24 0749   05/08/24 1400   neomycin  (MYCIFRADIN ) tablet 1,000 mg  Status:  Discontinued       Placed in "And" Linked Group   1,000 mg Oral 3 times per day 05/08/24 1119 05/08/24 1120   05/08/24 1400  metroNIDAZOLE  (FLAGYL ) tablet 1,000 mg  Status:  Discontinued       Placed in "And" Linked Group   1,000 mg Oral 3 times per day 05/08/24 1119 05/08/24 1120   05/08/24 1130  cefoTEtan  (CEFOTAN ) 2 g in sodium chloride  0.9 % 100 mL IVPB        2 g 200 mL/hr over 30 Minutes Intravenous On call to O.R. 05/08/24 1119 05/09/24 5188  Subjective   Having bright red bleeding per rectum   Objective    Physical Examination:  General-appears in no acute distress Heart-S1-S2, regular, no murmur auscultated Lungs-clear to auscultation bilaterally, no wheezing or crackles auscultated Abdomen-soft, nontender, no organomegaly Extremities-no edema in the lower extremities Neuro-alert, oriented x3, no focal deficit noted   Status is: Inpatient:      Pressure Injury 10/08/23 Buttocks Right;Mid Stage 2 -  Partial thickness loss of dermis presenting as a shallow open injury with a red, pink wound bed without slough. (Active)  10/08/23 0800  Location: Buttocks  Location Orientation: Right;Mid  Staging: Stage 2 -  Partial thickness loss of dermis presenting as a shallow open injury with a red, pink wound bed without slough.  Wound Description (Comments):   Present on Admission:         Bess Saltzman S Arlen Dupuis   [facility] If 7PM-7AM, please contact night-coverage at [email or web address], Office  [phone]   05/17/2024, 8:21 AM  LOS: 9 days

## 2024-05-17 NOTE — Progress Notes (Signed)
 Progress Note  Patient Name: Elizabeth Odom Date of Encounter: 05/17/2024  Primary Cardiologist: Cody Das, MD  Interval Summary  Chart reviewed.  She has continued to have issues with oozing/blood loss requiring PRBC transfusion.  Hemoglobin is up to 7.3 this morning.  Eliquis  has been discontinued for now.  Heart rate 90s to low 100s in atrial fibrillation.  She does not report any sense of palpitations or chest pain.  Vital Signs  Vitals:   05/16/24 2158 05/17/24 0159 05/17/24 0500 05/17/24 0614  BP: (!) 100/54 (!) 90/47  (!) 96/54  Pulse: 100 100  99  Resp: 20 19  18   Temp: 97.7 F (36.5 C) 98.8 F (37.1 C)  99.6 F (37.6 C)  TempSrc: Oral Oral  Oral  SpO2: 98% 97%  95%  Weight:   60.7 kg   Height:        Intake/Output Summary (Last 24 hours) at 05/17/2024 0852 Last data filed at 05/17/2024 0600 Gross per 24 hour  Intake 1630.5 ml  Output 1500 ml  Net 130.5 ml   Filed Weights   05/14/24 0139 05/16/24 0500 05/17/24 0500  Weight: 63.4 kg 63 kg 60.7 kg    Physical Exam  GEN: No acute distress.   Neck: No JVD. Cardiac: Irregularly irregular without gallop.  Respiratory: Nonlabored. Clear to auscultation bilaterally. MS: No edema.  ECG/Telemetry  Telemetry reviewed showing atrial fibrillation.  Labs  Chemistry Recent Labs  Lab 05/13/24 0521 05/14/24 0325 05/15/24 0339 05/16/24 0601 05/17/24 0008  NA 135   < > 132* 132* 133*  K 3.7   < > 3.4* 4.0 3.8  CL 102   < > 98 98 98  CO2 26   < > 25 23 25   GLUCOSE 140*   < > 128* 160* 134*  BUN 47*   < > 28* 36* 46*  CREATININE 0.75   < > 0.71 0.88 0.99  CALCIUM  8.7*   < > 7.9* 7.8* 7.3*  PROT 5.6*  --  5.3*  --   --   ALBUMIN  2.1*  --  1.6*  --   --   AST 33  --  51*  --   --   ALT 25  --  55*  --   --   ALKPHOS 56  --  78  --   --   BILITOT 0.7  --  1.8*  --   --   GFRNONAA >60   < > >60 >60 57*  ANIONGAP 7   < > 9 11 10    < > = values in this interval not displayed.    Hematology Recent  Labs  Lab 05/14/24 0325 05/16/24 0601 05/17/24 0008  WBC 16.0* 32.7* 24.4*  RBC 2.88* 1.99* 2.49*  HGB 8.9* 6.1* 7.3*  HCT 28.2* 19.6* 22.2*  MCV 97.9 98.5 89.2  MCH 30.9 30.7 29.3  MCHC 31.6 31.1 32.9  RDW 14.2 14.0 17.7*  PLT 401* 505* 393   Lipid Panel     Component Value Date/Time   CHOL 196 04/08/2024 1025   TRIG 119 05/13/2024 0521   HDL 59 04/08/2024 1025   CHOLHDL 3.3 04/08/2024 1025   LDLCALC 122 (H) 04/08/2024 1025   LABVLDL 15 04/08/2024 1025   Cardiac Studies  Echocardiogram 05/10/2024:  1. Left ventricular ejection fraction, by estimation, is 60 to 65%. The  left ventricle has normal function. The left ventricle has no regional  wall motion abnormalities. Left ventricular diastolic parameters are  indeterminate.   2. Right ventricular systolic function is normal. The right ventricular  size is normal. There is normal pulmonary artery systolic pressure.   3. The mitral valve was not well visualized. Mild mitral valve  regurgitation. No evidence of mitral stenosis.   4. The tricuspid valve is abnormal. Tricuspid valve regurgitation is mild  to moderate.   5. The aortic valve is tricuspid. Aortic valve regurgitation is not  visualized. No aortic stenosis is present.   6. The inferior vena cava is normal in size with greater than 50%  respiratory variability, suggesting right atrial pressure of 3 mmHg.   Assessment & Plan  1.  Persistent atrial fibrillation/flutter.  Currently on Lopressor  12.5 mg twice daily, Eliquis  discontinued in light of recurring postsurgical oozing/blood loss.  CHA2DS2-VASc score is 4.  2.  Primary hypertension by history, recent blood pressures low normal.  Not on Norvasc  at this time.  3.  Status post robotic LAR, takedown of end colostomy with anastomosis and resection of small bowel intestine with anastomosis and small bowel repair, lysis of adhesions.  Continue Lopressor  12.5 mg twice daily for now.  Heart rate control is  reasonable given the situation.  I agree with Dr. Hershell Lose, would not reinitiate anticoagulation at this time.  She needs to heal more completely before considering resumption of Eliquis .  For questions or updates, please contact [facility] Please consult [email or web address] for contact info under   Signed, Teddie Favre, MD  05/17/2024, 8:52 AM

## 2024-05-17 NOTE — Progress Notes (Signed)
 Hbg is 7.3. MD on call Dr. Elvan Hamel paged with results via AMION. Plan of care ongoing.

## 2024-05-17 NOTE — Progress Notes (Signed)
 Spoke with Dr. Alethea Andes via phone about a post blood HGB. MD ordered to put CBC in for am.

## 2024-05-18 DIAGNOSIS — I480 Paroxysmal atrial fibrillation: Secondary | ICD-10-CM | POA: Diagnosis not present

## 2024-05-18 DIAGNOSIS — D62 Acute posthemorrhagic anemia: Secondary | ICD-10-CM | POA: Diagnosis not present

## 2024-05-18 DIAGNOSIS — Z86711 Personal history of pulmonary embolism: Secondary | ICD-10-CM

## 2024-05-18 DIAGNOSIS — K5790 Diverticulosis of intestine, part unspecified, without perforation or abscess without bleeding: Secondary | ICD-10-CM | POA: Diagnosis not present

## 2024-05-18 DIAGNOSIS — I4819 Other persistent atrial fibrillation: Secondary | ICD-10-CM | POA: Diagnosis not present

## 2024-05-18 DIAGNOSIS — K5730 Diverticulosis of large intestine without perforation or abscess without bleeding: Secondary | ICD-10-CM

## 2024-05-18 LAB — BASIC METABOLIC PANEL WITH GFR
Anion gap: 9 (ref 5–15)
BUN: 25 mg/dL — ABNORMAL HIGH (ref 8–23)
CO2: 24 mmol/L (ref 22–32)
Calcium: 7.6 mg/dL — ABNORMAL LOW (ref 8.9–10.3)
Chloride: 101 mmol/L (ref 98–111)
Creatinine, Ser: 0.59 mg/dL (ref 0.44–1.00)
GFR, Estimated: >60 mL/min (ref >60)
Glucose, Bld: 150 mg/dL — ABNORMAL HIGH (ref 70–99)
Potassium: 4 mmol/L (ref 3.5–5.1)
Sodium: 134 mmol/L — ABNORMAL LOW (ref 135–145)

## 2024-05-18 LAB — CBC
HCT: 29.5 % — ABNORMAL LOW (ref 36.0–46.0)
HCT: 30.2 % — ABNORMAL LOW (ref 36.0–46.0)
Hemoglobin: 9.8 g/dL — ABNORMAL LOW (ref 12.0–15.0)
Hemoglobin: 10 g/dL — ABNORMAL LOW (ref 12.0–15.0)
MCH: 29.3 pg (ref 26.0–34.0)
MCH: 29.2 pg (ref 26.0–34.0)
MCHC: 33.2 g/dL (ref 30.0–36.0)
MCHC: 33.1 g/dL (ref 30.0–36.0)
MCV: 88.3 fL (ref 80.0–100.0)
MCV: 88.3 fL (ref 80.0–100.0)
Platelets: 430 10*3/uL — ABNORMAL HIGH (ref 150–400)
Platelets: 473 10*3/uL — ABNORMAL HIGH (ref 150–400)
RBC: 3.34 MIL/uL — ABNORMAL LOW (ref 3.87–5.11)
RBC: 3.42 MIL/uL — ABNORMAL LOW (ref 3.87–5.11)
RDW: 17.2 % — ABNORMAL HIGH (ref 11.5–15.5)
RDW: 17.2 % — ABNORMAL HIGH (ref 11.5–15.5)
WBC: 17.9 10*3/uL — ABNORMAL HIGH (ref 4.0–10.5)
WBC: 14.8 10*3/uL — ABNORMAL HIGH (ref 4.0–10.5)
nRBC: 0.9 % — ABNORMAL HIGH (ref 0.0–0.2)
nRBC: 1.5 % — ABNORMAL HIGH (ref 0.0–0.2)

## 2024-05-18 LAB — GLUCOSE, CAPILLARY
Glucose-Capillary: 123 mg/dL — ABNORMAL HIGH (ref 70–99)
Glucose-Capillary: 127 mg/dL — ABNORMAL HIGH (ref 70–99)
Glucose-Capillary: 143 mg/dL — ABNORMAL HIGH (ref 70–99)
Glucose-Capillary: 150 mg/dL — ABNORMAL HIGH (ref 70–99)

## 2024-05-18 LAB — PHOSPHORUS: Phosphorus: 2.5 mg/dL (ref 2.5–4.6)

## 2024-05-18 LAB — MAGNESIUM: Magnesium: 2 mg/dL (ref 1.7–2.4)

## 2024-05-18 MED ORDER — TRAVASOL 10 % IV SOLN
INTRAVENOUS | Status: AC
  Filled 2024-05-18: qty 840

## 2024-05-18 NOTE — Progress Notes (Signed)
 PHARMACY - TOTAL PARENTERAL NUTRITION CONSULT NOTE   Indication: Prolonged ileus  Patient Measurements: Height: 5\' 5"  (165.1 cm) Weight: 61 kg (134 lb 7.7 oz) IBW/kg (Calculated) : 57 TPN AdjBW (KG): 61.3 Body mass index is 22.38 kg/m.  Assessment: 72 yoF with history of diverticulitis with perforation and abscess. 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 She requested ostomy takedown and underwent LOA, colostomy takedown, small bowel resection with anastomosis on 5/29. Pharmacy is consulted to dose TPN starting 6/2 for postop ileus.  Glucose / Insulin : no Hx DM, A1c 5.1% -CBG goal 100-150. Range: 143-150 (6 units SSI/24 hrs) Electrolytes: Na (134) remains slightly low on max concentration of Na in TPN.  -All Others WNL, including CorrCa (9.5). Renal: SCr <1; BUN decreased Hepatic: AST/ALT, Tbili slightly elevated; alb low 1.6 I/O: no mIVF -UOP: 1200 mL, Stool x14 -PO intake: 780 mL, RN reports eating only minimally  GI Imaging: none GI Surgeries / Procedures: -5/29: LAR, SBR, colostomy takedown, LOA  Central access: Double lumen PICC placed 6/2 TPN start date: 6/2  Nutritional Goals: -Goal TPN rate is 70 mL/hr (provides 84 g of protein and 1720 kcals per day)  RD Assessment:  Estimated Needs Total Energy Estimated Needs: 1600-1750 kcals Total Protein Estimated Needs: 80-95 grams Total Fluid Estimated Needs: >/= 1.6L  Current Nutrition:  Calorie count ongoing -Pt refusing all Boost Plus supplements -Diet: soft, minimal intake reported  Plan:   At 18:00 Continue TPN at goal rate of 70 mL/hr Electrolytes in TPN: No change Na 150 mEq/L (max), K 50 mEq/L, Ca 5 mEq/L, Mg 5 mEq/L, Phos 15 mmol/L.  Cl:Ac 1:1 Continue moderate SSI q8h MVI PO Monitor TPN labs on Mon/Thurs.  Per surgeon, ok to cut TPN rate in half if calorie count meeting > 50% of  needs.  Shireen Dory, PharmD, BCPS Clinical Pharmacist 05/18/2024 9:55 AM

## 2024-05-18 NOTE — Progress Notes (Signed)
 [facility]  PROGRESS NOTE  Elizabeth Odom ZOX:[id number] DOB: 15-Sep-1941 DOA: 05/08/2024 PCP: Anthon Kins, MD   Brief HPI:    83 year old female, lives independently, medical history significant for remote history of A-fib evaluated with a loop recorder which was negative, at some point was on anticoagulation, had some bleeding and this was discontinued, anemia, PACs, HTN, mild cognitive impairment, syncope, large bowel obstruction prompting colon resection, creation 09/2020 and at that hospitalization found to have left leg DVT, small PE when given recent surgery with inability to use anticoagulation, had IVC filter placed that was subsequently removed, underwent elective robotic rectosigmoid resection, colostomy takedown with bowel anastomosis, extensive lysis of additions on 5/29 after preop cardiac clearance.  On 5/30 even in, patient developed AMS after polypharmacy along with A-fib with RVR and TRH was consulted.  Patient was transferred to stepdown unit.  Course complicated by A-fib with RVR, cardiology consulting, on IV beta-blockers.  Also hypotension, rectal bleed and syncope x 1 on 6/1, prolonged postop ileus, currently with NG tube, PICC line and TPN.    Assessment/Plan:   Postop A-fib with RVR/PVCs Remote history of A-fib not on rate control meds or anticoagulation preop Seen by Dr. Filiberto Hug in preop clearance on 04/08/2024 Likely stress response due to recent surgery, multiple meds, postop acute blood loss anemia. TTE with preserved LVEF.  TSH normal. Cardiology following, started on Toprol  XL 25 mg at bedtime Started on apixaban  for anticoagulation - Apixaban  on hold for recurrent lower GI bleeding   Acute blood loss anemia - Hemoglobin dropped to 6.9 -1 extra unit of PRBC ordered this afternoon -Follow CBC in a.m.  Hypotension Had SBP in the 70s on 6/1 afternoon. Suspected due to intravascular volume depletion Improved after IV fluid bolus and maintenance IV  fluids.     Acute kidney injury Resolved, likely in setting of hypotension   Syncope x 1 On 6/1 when patient was on bedside commode Suspected due to hypotension or vasovagal If patient drives, she must be counseled not to drive x 6 months. Echo with preserved LVEF Telemetry with A-fib with controlled ventricular rate.   Postop ileus Management per primary service -Started on TPN    Urinary retention -Status post Foley placement on 6/4 -Plan for voiding trial on 6/7; if fails then Foley will be replaced and patient can follow-up with urology .   Hypomagnesemia Replaced Attempt to keep potassium >4 and magnesium  >2   Hypophosphatemia Replaced by pharmacy in TPN and monitor.   Leukocytosis May be stress response.  No clinical concern for infection apart from recent abdominal surgery-defer antibiotics for that to primary service. Completed IV azithromycin for GI dysmotility No clinical UTI.  Urine microscopy without concern for UTI.  Chest x-ray without pneumonia Fluctuating but overall stable   S/p colostomy takedown Management per primary service.     Mild delirium -resolved Ongoing on and off, multifactorial due to acute illness, recent surgery, meds, ICU/hospital delirium Delirium precautions Minimize opioids and sedatives.    Medications     acetaminophen   500 mg Oral TID WC & HS   ascorbic acid   250 mg Oral Daily   Chlorhexidine  Gluconate Cloth  6 each Topical Daily   cyanocobalamin   1,000 mcg Oral Daily   insulin  aspart  0-15 Units Subcutaneous Q8H   lactose free nutrition  237 mL Oral TID WC   liver oil-zinc  oxide   Topical BID   metoprolol  tartrate  12.5 mg Oral BID   multivitamin with minerals  1 tablet Oral Daily   pantoprazole   40 mg Oral Daily   polycarbophil  625 mg Oral BID   sodium chloride  flush  10-40 mL Intracatheter Q12H   sodium chloride  flush  3 mL Intravenous Q12H     Data Reviewed:   CBG:  Recent Labs  Lab 05/17/24 0017  05/17/24 0801 05/17/24 1139 05/17/24 1716 05/18/24 0017  GLUCAP 141* 133* 149* 143* 150*    SpO2: 94 % O2 Flow Rate (L/min): 6 L/min    Vitals:   05/17/24 2027 05/18/24 0156 05/18/24 0500 05/18/24 0600  BP: (!) 110/59 104/63  104/63  Pulse: 95 (!) 101  100  Resp: 18 18  18   Temp: 98.7 F (37.1 C) 98 F (36.7 C)  98 F (36.7 C)  TempSrc: Oral Oral  Oral  SpO2: 95% 94%  94%  Weight:   61 kg   Height:          Data Reviewed:  Basic Metabolic Panel: Recent Labs  Lab 05/13/24 0521 05/14/24 0325 05/15/24 0339 05/16/24 0601 05/17/24 0008 05/18/24 0318  NA 135 135 132* 132* 133* 134*  K 3.7 3.5 3.4* 4.0 3.8 4.0  CL 102 100 98 98 98 101  CO2 26 25 25 23 25 24   GLUCOSE 140* 142* 128* 160* 134* 150*  BUN 47* 34* 28* 36* 46* 25*  CREATININE 0.75 0.78 0.71 0.88 0.99 0.59  CALCIUM  8.7* 8.2* 7.9* 7.8* 7.3* 7.6*  MG 2.2 2.0 1.9  --  2.0 2.0  PHOS 2.2* 2.6 3.0  --  3.4 2.5    CBC: Recent Labs  Lab 05/14/24 0325 05/16/24 0601 05/17/24 0008 05/17/24 1229 05/18/24 0318  WBC 16.0* 32.7* 24.4* 21.7* 17.9*  HGB 8.9* 6.1* 7.3* 6.9* 9.8*  HCT 28.2* 19.6* 22.2* 20.9* 29.5*  MCV 97.9 98.5 89.2 88.6 88.3  PLT 401* 505* 393 428* 430*    LFT Recent Labs  Lab 05/13/24 0521 05/15/24 0339  AST 33 51*  ALT 25 55*  ALKPHOS 56 78  BILITOT 0.7 1.8*  PROT 5.6* 5.3*  ALBUMIN  2.1* 1.6*     Antibiotics: Anti-infectives (From admission, onward)    Start     Dose/Rate Route Frequency Ordered Stop   05/09/24 0900  erythromycin  250 mg in sodium chloride  0.9 % 100 mL IVPB        250 mg 100 mL/hr over 60 Minutes Intravenous Every 8 hours 05/09/24 0732 05/11/24 0044   05/08/24 2200  cefoTEtan  (CEFOTAN ) 2 g in sodium chloride  0.9 % 100 mL IVPB        2 g 200 mL/hr over 30 Minutes Intravenous Every 12 hours 05/08/24 1759 05/09/24 0749   05/08/24 1400  neomycin  (MYCIFRADIN ) tablet 1,000 mg  Status:  Discontinued       Placed in "And" Linked Group   1,000 mg Oral 3 times per  day 05/08/24 1119 05/08/24 1120   05/08/24 1400  metroNIDAZOLE  (FLAGYL ) tablet 1,000 mg  Status:  Discontinued       Placed in "And" Linked Group   1,000 mg Oral 3 times per day 05/08/24 1119 05/08/24 1120   05/08/24 1130  cefoTEtan  (CEFOTAN ) 2 g in sodium chloride  0.9 % 100 mL IVPB        2 g 200 mL/hr over 30 Minutes Intravenous On call to O.R. 05/08/24 1119 05/09/24 0749        Subjective   Complains of abdominal pain   Objective    Physical Examination:  Appears in no  acute distress Abdomen is soft, tender to palpation Lungs clear to auscultation bilaterally Heart irregularly irregular rhythm   Status is: Inpatient:      Pressure Injury 10/08/23 Buttocks Right;Mid Stage 2 -  Partial thickness loss of dermis presenting as a shallow open injury with a red, pink wound bed without slough. (Active)  10/08/23 0800  Location: Buttocks  Location Orientation: Right;Mid  Staging: Stage 2 -  Partial thickness loss of dermis presenting as a shallow open injury with a red, pink wound bed without slough.  Wound Description (Comments):   Present on Admission:         Dondi Burandt S Sherryl Valido   [facility] If 7PM-7AM, please contact night-coverage at [email or web address], Office  [phone]   05/18/2024, 7:34 AM  LOS: 10 days

## 2024-05-18 NOTE — Progress Notes (Signed)
 Mobility Specialist - Progress Note   05/18/24 0912  Orthostatic Lying   BP- Lying 111/70  Orthostatic Sitting  BP- Sitting 112/58  Mobility  Activity Dangled on edge of bed  Level of Assistance Moderate assist, patient does 50-74%  Activity Response Tolerated well  Mobility Referral Yes  Mobility visit 1 Mobility  Mobility Specialist Start Time (ACUTE ONLY) [id number]  Mobility Specialist Stop Time (ACUTE ONLY) 0911  Mobility Specialist Time Calculation (min) (ACUTE ONLY) 13 min   Pt received in bed and agreeable to mobility. Lethargic throughout session. BP checked and recorded above. Pt was modA from supine>sitting. Pt tolerated sitting EOB for ~3min. No complaints during session.  Pt to bed after session with all needs met. Bed alarm on.   [facility]

## 2024-05-18 NOTE — Plan of Care (Signed)
  Problem: Bowel/Gastric: Goal: Gastrointestinal status for postoperative course will improve Outcome: Progressing   Problem: Education: Goal: Knowledge of General Education information will improve Description: Including pain rating scale, medication(s)/side effects and non-pharmacologic comfort measures Outcome: Progressing   Problem: Health Behavior/Discharge Planning: Goal: Ability to manage health-related needs will improve Outcome: Progressing   Problem: Clinical Measurements: Goal: Will remain free from infection Outcome: Progressing Goal: Diagnostic test results will improve Outcome: Progressing

## 2024-05-18 NOTE — Progress Notes (Signed)
 10 Days Post-Op   Subjective/Chief Complaint: Seems confused this morning.  Multiple bowel movements overnight.  Hemoglobin had an appropriate rise after transfusion   Objective: Vital signs in last 24 hours: Temp:  [98 F (36.7 C)-98.7 F (37.1 C)] 98 F (36.7 C) (06/08 0156) Pulse Rate:  [80-111] 101 (06/08 0156) Resp:  [18] 18 (06/08 0156) BP: (92-170)/(40-63) 104/63 (06/08 0156) SpO2:  [94 %-99 %] 94 % (06/08 0156) Weight:  [61 kg] 61 kg (06/08 0500) Last BM Date : 05/17/24  Intake/Output from previous day: 06/07 0701 - 06/08 0700 In: 4554.2 [P.O.:780; I.V.:3015; Blood:759.2] Out: 900 [Urine:900] Intake/Output this shift: Total I/O In: 1470.8 [P.O.:300; I.V.:820.8; Blood:350] Out: 400 [Urine:400]  General appearance: alert and cooperative Resp: clear to auscultation bilaterally Cardio: irregularly irregular rhythm GI: Distended with mild tenderness  Lab Results:  Recent Labs    05/17/24 1229 05/18/24 0318  WBC 21.7* 17.9*  HGB 6.9* 9.8*  HCT 20.9* 29.5*  PLT 428* 430*   BMET Recent Labs    05/17/24 0008 05/18/24 0318  NA 133* 134*  K 3.8 4.0  CL 98 101  CO2 25 24  GLUCOSE 134* 150*  BUN 46* 25*  CREATININE 0.99 0.59  CALCIUM  7.3* 7.6*   PT/INR No results for input(s): "LABPROT", "INR" in the last 72 hours. ABG No results for input(s): "PHART", "HCO3" in the last 72 hours.  Invalid input(s): "PCO2", "PO2"  Studies/Results: No results found.  Anti-infectives: Anti-infectives (From admission, onward)    Start     Dose/Rate Route Frequency Ordered Stop   05/09/24 0900  erythromycin  250 mg in sodium chloride  0.9 % 100 mL IVPB        250 mg 100 mL/hr over 60 Minutes Intravenous Every 8 hours 05/09/24 0732 05/11/24 0044   05/08/24 2200  cefoTEtan  (CEFOTAN ) 2 g in sodium chloride  0.9 % 100 mL IVPB        2 g 200 mL/hr over 30 Minutes Intravenous Every 12 hours 05/08/24 1759 05/09/24 0749   05/08/24 1400  neomycin  (MYCIFRADIN ) tablet 1,000 mg   Status:  Discontinued       Placed in "And" Linked Group   1,000 mg Oral 3 times per day 05/08/24 1119 05/08/24 1120   05/08/24 1400  metroNIDAZOLE  (FLAGYL ) tablet 1,000 mg  Status:  Discontinued       Placed in "And" Linked Group   1,000 mg Oral 3 times per day 05/08/24 1119 05/08/24 1120   05/08/24 1130  cefoTEtan  (CEFOTAN ) 2 g in sodium chloride  0.9 % 100 mL IVPB        2 g 200 mL/hr over 30 Minutes Intravenous On call to O.R. 05/08/24 1119 05/09/24 0749       Assessment/Plan: s/p Procedure(s) with comments: CLOSURE, COLOSTOMY, ROBOT-ASSISTED -ROBOTIC RECTOSIGMOID RESECTION (LAR) -TAKEDOWN OF END COLOSTOMY WITH ANASTOMOSIS -RESECTION OF SMALL INTESTINE WITH ANASTOMOSIS -SMALL BOWEL REPAIR -LYSIS OF ADHESIONS x 115 MINUTES (66% OF CASE),  -INTRAOPERATIVE ASSESSMENT OF TISSUE VASCULAR PERFUSION USING ICG (indocyanine green ) IMMUNOFLUORESCENCE,  -TRANSVERSUS ABDOMINIS PLANE (TAP) BLOCK - BILATERAL (N/A) - ROBOTIC OSTOMY TAKEDOWN LYSIS, ADHESIONS, LAPAROSCOPIC (N/A) - LYSIS OF ADHESIONS SIGMOIDOSCOPY, FLEXIBLE (N/A) CYSTOSCOPY WITH INDOCYANINE GREEN  IMAGING (ICG) (N/A) Continue TPN and diet as tolerated Hemoglobin had an appropriate rise after transfusion.  No further evidence of ongoing blood in the stool A-fib.  Cards following White count continues to improve  LOS: 10 days    Lillette Reid III 05/18/2024

## 2024-05-18 NOTE — Progress Notes (Signed)
 Progress Note  Patient Name: Elizabeth Odom Date of Encounter: 05/18/2024  Primary Cardiologist: Cody Das, MD  Interval Summary  Chart reviewed.  Patient states she feels thirsty, dry mouth.  Has had several bowel movements.  No obvious blood loss, hemoglobin has come up to 9.8.  She does not report any chest pain or palpitations.  Vital Signs  Vitals:   05/17/24 2027 05/18/24 0156 05/18/24 0500 05/18/24 0600  BP: (!) 110/59 104/63  104/63  Pulse: 95 (!) 101  100  Resp: 18 18  18   Temp: 98.7 F (37.1 C) 98 F (36.7 C)  98 F (36.7 C)  TempSrc: Oral Oral  Oral  SpO2: 95% 94%  94%  Weight:   61 kg   Height:        Intake/Output Summary (Last 24 hours) at 05/18/2024 0732 Last data filed at 05/18/2024 0600 Gross per 24 hour  Intake 4554.19 ml  Output 1200 ml  Net 3354.19 ml   Filed Weights   05/16/24 0500 05/17/24 0500 05/18/24 0500  Weight: 63 kg 60.7 kg 61 kg    Physical Exam  GEN: No acute distress.   Neck: No JVD. Cardiac: Irregularly irregular without gallop.  Respiratory: Nonlabored. Clear to auscultation bilaterally. MS: No edema.  ECG/Telemetry  Reviewed showing atrial fibrillation with heart rate in the 90s.  Labs  Chemistry Recent Labs  Lab 05/13/24 1610 05/14/24 0325 05/15/24 0339 05/16/24 0601 05/17/24 0008 05/18/24 0318  NA 135   < > 132* 132* 133* 134*  K 3.7   < > 3.4* 4.0 3.8 4.0  CL 102   < > 98 98 98 101  CO2 26   < > 25 23 25 24   GLUCOSE 140*   < > 128* 160* 134* 150*  BUN 47*   < > 28* 36* 46* 25*  CREATININE 0.75   < > 0.71 0.88 0.99 0.59  CALCIUM  8.7*   < > 7.9* 7.8* 7.3* 7.6*  PROT 5.6*  --  5.3*  --   --   --   ALBUMIN  2.1*  --  1.6*  --   --   --   AST 33  --  51*  --   --   --   ALT 25  --  55*  --   --   --   ALKPHOS 56  --  78  --   --   --   BILITOT 0.7  --  1.8*  --   --   --   GFRNONAA >60   < > >60 >60 57* >60  ANIONGAP 7   < > 9 11 10 9    < > = values in this interval not displayed.     Hematology Recent Labs  Lab 05/17/24 0008 05/17/24 1229 05/18/24 0318  WBC 24.4* 21.7* 17.9*  RBC 2.49* 2.36* 3.34*  HGB 7.3* 6.9* 9.8*  HCT 22.2* 20.9* 29.5*  MCV 89.2 88.6 88.3  MCH 29.3 29.2 29.3  MCHC 32.9 33.0 33.2  RDW 17.7* 18.0* 17.2*  PLT 393 428* 430*   Lipid Panel     Component Value Date/Time   CHOL 196 04/08/2024 1025   TRIG 119 05/13/2024 0521   HDL 59 04/08/2024 1025   CHOLHDL 3.3 04/08/2024 1025   LDLCALC 122 (H) 04/08/2024 1025   LABVLDL 15 04/08/2024 1025   Cardiac Studies  Echocardiogram 05/10/2024:  1. Left ventricular ejection fraction, by estimation, is 60 to 65%. The  left ventricle  has normal function. The left ventricle has no regional  wall motion abnormalities. Left ventricular diastolic parameters are  indeterminate.   2. Right ventricular systolic function is normal. The right ventricular  size is normal. There is normal pulmonary artery systolic pressure.   3. The mitral valve was not well visualized. Mild mitral valve  regurgitation. No evidence of mitral stenosis.   4. The tricuspid valve is abnormal. Tricuspid valve regurgitation is mild  to moderate.   5. The aortic valve is tricuspid. Aortic valve regurgitation is not  visualized. No aortic stenosis is present.   6. The inferior vena cava is normal in size with greater than 50%  respiratory variability, suggesting right atrial pressure of 3 mmHg.   Assessment & Plan  1.  Persistent atrial fibrillation/flutter.  Currently on Lopressor  12.5 mg twice daily, Eliquis  held in light of recurring postsurgical oozing/blood loss.  CHA2DS2-VASc score is 4.  Hemoglobin up to 9.8 after PRBC.  2.  Primary hypertension by history, recent blood pressures low normal.  Not on Norvasc  at this time.  3.  Status post robotic LAR, takedown of end colostomy with anastomosis and resection of small bowel intestine with anastomosis and small bowel repair, lysis of adhesions.  Continue Lopressor  12.5 mg  twice daily.  Norvasc  and Eliquis  are both on hold at this time.  Will continue to follow with you.  For questions or updates, please contact [facility] Please consult [email or web address] for contact info under   Signed, Teddie Favre, MD  05/18/2024, 7:32 AM

## 2024-05-18 NOTE — Progress Notes (Signed)
 Patient had multiple bowel movement with small clots of dark blood. 2 units of PRBC transfused yesterday, Hgb 9.8 this morning.

## 2024-05-19 ENCOUNTER — Inpatient Hospital Stay (HOSPITAL_COMMUNITY)

## 2024-05-19 DIAGNOSIS — I4891 Unspecified atrial fibrillation: Secondary | ICD-10-CM | POA: Diagnosis not present

## 2024-05-19 DIAGNOSIS — I10 Essential (primary) hypertension: Secondary | ICD-10-CM | POA: Diagnosis not present

## 2024-05-19 LAB — BPAM RBC
Blood Product Expiration Date: 202507022359
Blood Product Expiration Date: 202507022359
Blood Product Expiration Date: 202507062359
Blood Product Expiration Date: 202507062359
ISSUE DATE / TIME: 202506061148
ISSUE DATE / TIME: 202506061537
ISSUE DATE / TIME: 202506071333
ISSUE DATE / TIME: 202506071727
Unit Type and Rh: 6200
Unit Type and Rh: 6200
Unit Type and Rh: 6200
Unit Type and Rh: 6200

## 2024-05-19 LAB — TYPE AND SCREEN
ABO/RH(D): A POS
Antibody Screen: NEGATIVE
Unit division: 0
Unit division: 0
Unit division: 0
Unit division: 0

## 2024-05-19 LAB — COMPREHENSIVE METABOLIC PANEL WITH GFR
ALT: 65 U/L — ABNORMAL HIGH (ref 0–44)
AST: 54 U/L — ABNORMAL HIGH (ref 15–41)
Albumin: <1.5 g/dL — ABNORMAL LOW (ref 3.5–5.0)
Alkaline Phosphatase: 245 U/L — ABNORMAL HIGH (ref 38–126)
Anion gap: 9 (ref 5–15)
BUN: 22 mg/dL (ref 8–23)
CO2: 24 mmol/L (ref 22–32)
Calcium: 7.5 mg/dL — ABNORMAL LOW (ref 8.9–10.3)
Chloride: 99 mmol/L (ref 98–111)
Creatinine, Ser: 0.63 mg/dL (ref 0.44–1.00)
GFR, Estimated: >60 mL/min (ref >60)
Glucose, Bld: 124 mg/dL — ABNORMAL HIGH (ref 70–99)
Potassium: 4.3 mmol/L (ref 3.5–5.1)
Sodium: 132 mmol/L — ABNORMAL LOW (ref 135–145)
Total Bilirubin: 1.4 mg/dL — ABNORMAL HIGH (ref 0.0–1.2)
Total Protein: 5.5 g/dL — ABNORMAL LOW (ref 6.5–8.1)

## 2024-05-19 LAB — CBC
HCT: 30.2 % — ABNORMAL LOW (ref 36.0–46.0)
Hemoglobin: 9.7 g/dL — ABNORMAL LOW (ref 12.0–15.0)
MCH: 28.9 pg (ref 26.0–34.0)
MCHC: 32.1 g/dL (ref 30.0–36.0)
MCV: 89.9 fL (ref 80.0–100.0)
Platelets: 491 10*3/uL — ABNORMAL HIGH (ref 150–400)
RBC: 3.36 MIL/uL — ABNORMAL LOW (ref 3.87–5.11)
RDW: 17.5 % — ABNORMAL HIGH (ref 11.5–15.5)
WBC: 13.8 10*3/uL — ABNORMAL HIGH (ref 4.0–10.5)
nRBC: 0.7 % — ABNORMAL HIGH (ref 0.0–0.2)

## 2024-05-19 LAB — PROTIME-INR
INR: 1.2 (ref 0.8–1.2)
Prothrombin Time: 14.9 s (ref 11.4–15.2)

## 2024-05-19 LAB — PHOSPHORUS: Phosphorus: 3.5 mg/dL (ref 2.5–4.6)

## 2024-05-19 LAB — MAGNESIUM: Magnesium: 2.1 mg/dL (ref 1.7–2.4)

## 2024-05-19 LAB — GLUCOSE, CAPILLARY
Glucose-Capillary: 113 mg/dL — ABNORMAL HIGH (ref 70–99)
Glucose-Capillary: 131 mg/dL — ABNORMAL HIGH (ref 70–99)
Glucose-Capillary: 144 mg/dL — ABNORMAL HIGH (ref 70–99)

## 2024-05-19 LAB — TRIGLYCERIDES: Triglycerides: 120 mg/dL (ref <150)

## 2024-05-19 LAB — PREALBUMIN: Prealbumin: 10 mg/dL — ABNORMAL LOW (ref 18–38)

## 2024-05-19 MED ORDER — IOHEXOL 9 MG/ML PO SOLN
ORAL | Status: AC
  Filled 2024-05-19: qty 1000

## 2024-05-19 MED ORDER — HYDROCODONE-ACETAMINOPHEN 5-325 MG PO TABS
1.0000 | ORAL_TABLET | ORAL | Status: DC | PRN
  Administered 2024-05-21 – 2024-05-23 (×5): 1 via ORAL
  Filled 2024-05-19 (×5): qty 1

## 2024-05-19 MED ORDER — METOPROLOL SUCCINATE ER 25 MG PO TB24: 25.0000 mg | ORAL_TABLET | Freq: Every day | ORAL | Status: DC

## 2024-05-19 MED ORDER — TRAVASOL 10 % IV SOLN
INTRAVENOUS | Status: AC
  Filled 2024-05-19: qty 840

## 2024-05-19 MED ORDER — TAMSULOSIN HCL 0.4 MG PO CAPS
0.4000 mg | ORAL_CAPSULE | Freq: Every day | ORAL | Status: DC
  Administered 2024-05-22 – 2024-06-01 (×8): 0.4 mg via ORAL
  Filled 2024-05-19 (×11): qty 1

## 2024-05-19 MED ORDER — METOPROLOL TARTRATE 12.5 MG HALF TABLET: 12.5000 mg | ORAL_TABLET | Freq: Two times a day (BID) | ORAL | Status: AC

## 2024-05-19 MED ORDER — CALCIUM POLYCARBOPHIL 625 MG PO TABS
625.0000 mg | ORAL_TABLET | Freq: Every day | ORAL | Status: DC
  Administered 2024-05-19 – 2024-05-23 (×3): 625 mg via ORAL
  Filled 2024-05-19 (×4): qty 1

## 2024-05-19 MED ORDER — IOHEXOL 300 MG/ML  SOLN
100.0000 mL | Freq: Once | INTRAMUSCULAR | Status: AC | PRN
  Administered 2024-05-19: 100 mL via INTRAVENOUS

## 2024-05-19 MED ORDER — LOPERAMIDE HCL 2 MG PO CAPS: 2.0000 mg | ORAL_CAPSULE | Freq: Two times a day (BID) | ORAL | Status: DC

## 2024-05-19 MED ORDER — PIPERACILLIN-TAZOBACTAM 3.375 G IVPB
3.3750 g | Freq: Three times a day (TID) | INTRAVENOUS | Status: DC
  Administered 2024-05-19 – 2024-05-23 (×12): 3.375 g via INTRAVENOUS
  Filled 2024-05-19 (×12): qty 50

## 2024-05-19 MED ORDER — LACTATED RINGERS IV BOLUS: 1000.0000 mL | Freq: Three times a day (TID) | INTRAVENOUS | Status: AC | PRN

## 2024-05-19 MED ORDER — FENTANYL CITRATE PF 50 MCG/ML IJ SOSY
25.0000 ug | PREFILLED_SYRINGE | INTRAMUSCULAR | Status: DC | PRN
  Administered 2024-05-19 – 2024-05-21 (×5): 25 ug via INTRAVENOUS
  Filled 2024-05-19 (×5): qty 1

## 2024-05-19 MED ORDER — IOHEXOL 9 MG/ML PO SOLN
500.0000 mL | ORAL | Status: AC
  Administered 2024-05-19: 500 mL via ORAL

## 2024-05-19 NOTE — Progress Notes (Signed)
 PT Cancellation Note  Patient Details Name: Elizabeth Odom MRN: [id number] DOB: 1941/02/11   Cancelled Treatment:     ABD CT earlier.  "I can't get up. I am waiting on my results".  "I'm afraid to move", stated Pt. Pt agreed for me to come back tomorrow morning.  Bess Broody  PTA Acute  Rehabilitation Services Office M-F          [phone]

## 2024-05-19 NOTE — Progress Notes (Addendum)
 CT scan done.  Anterior fluid collections and fluid collections in the pelvis.  Very suspicious for colorectal anastomotic disruption.  Discussed with radiology.  They favor left anterior partial breach.  Small bowel anastomosis rather dilated.  Would be very unusual to get a leak there  Patient not in septic shock and denies abdominal pain.  We will see if we can control these with abdominal and transgluteal drains, TPN, antibiotics, bowel rest.  And see if we can get these under control and calm down.  If she does not turnaround, she will require operative exploration washout and fecal diversion.  May end up with permanent end colostomy again versus trying to do a diverting loop ileostomy if the anastomosis is partially salvageable.  We will see.

## 2024-05-19 NOTE — Progress Notes (Signed)
 OT Cancellation Note  Patient Details Name: Elizabeth Odom MRN: [id number] DOB: 07-Aug-1941   Cancelled Treatment:    Reason Eval/Treat Not Completed: Medical issues which prohibited therapy Patient just back from CT with MD reporting "Very suspicious for colorectal anastomotic disruption" OT to continue to follow and check back as schedule will allow.  Wynette Heckler, MS Acute Rehabilitation Department Office# [phone]  05/19/2024, 1:33 PM

## 2024-05-19 NOTE — Plan of Care (Signed)
  Problem: Nutritional: Goal: Will attain and maintain optimal nutritional status will improve Outcome: Progressing   Problem: Health Behavior/Discharge Planning: Goal: Ability to manage health-related needs will improve Outcome: Progressing   Problem: Clinical Measurements: Goal: Ability to maintain clinical measurements within normal limits will improve Outcome: Progressing

## 2024-05-19 NOTE — Progress Notes (Signed)
 I updated the patient's status to the patient's granddaughter Genevive Ket.   Recommendations were made.  Questions were answered.  She expressed understanding & appreciation.

## 2024-05-19 NOTE — Progress Notes (Signed)
 Interventional Radiology Brief Note:  Consult for intra-abdominal drain placement noted.  Case to be reviewed by IR attending 6/10.  Will place NPO orders p MN pending review of case.  INR ordered.  Jacen Carlini, MS RD PA-C

## 2024-05-19 NOTE — Plan of Care (Signed)
  Problem: Education: Goal: Understanding of discharge needs will improve Outcome: Not Applicable   Problem: Activity: Goal: Ability to tolerate increased activity will improve Outcome: Not Applicable

## 2024-05-19 NOTE — Progress Notes (Addendum)
 Progress Note  Patient Name: Elizabeth Odom Date of Encounter: 05/19/2024  Primary Cardiologist: Cody Das, MD  Interval Summary  Chart reviewed. Denies any CP, SOB or palpitations  Vital Signs  Vitals:   05/18/24 1459 05/18/24 1843 05/18/24 2048 05/19/24 0620  BP: 111/65 (!) 116/47 119/65 (!) 108/48  Pulse: (!) 104 96 (!) 106 100  Resp: 19 (!) 22 18 18   Temp: 98.5 F (36.9 C) 99.4 F (37.4 C) 98.6 F (37 C) 99.7 F (37.6 C)  TempSrc: Oral Oral Oral Oral  SpO2: 97% 96% 96% 96%  Weight:      Height:        Intake/Output Summary (Last 24 hours) at 05/19/2024 1238 Last data filed at 05/19/2024 1000 Gross per 24 hour  Intake 2674.16 ml  Output 1925 ml  Net 749.16 ml   Filed Weights   05/16/24 0500 05/17/24 0500 05/18/24 0500  Weight: 63 kg 60.7 kg 61 kg    Physical Exam  GEN: thin cachectic appearing HEENT: Normal NECK: No JVD; No carotid bruits LYMPHATICS: No lymphadenopathy CARDIAC:irregularly irregular, no murmurs, rubs, gallops RESPIRATORY: few crackles at left base ABDOMEN: Soft, non-tender, non-distended MUSCULOSKELETAL:  No edema; No deformity  SKIN: Warm and dry NEUROLOGIC:  Alert and oriented x 3 PSYCHIATRIC:  Normal affect   ECG/Telemetry Tele personally reviewed and demonstrates atrial fib with CVR  Labs  Chemistry Recent Labs  Lab 05/13/24 0521 05/14/24 0325 05/15/24 1610 05/16/24 0601 05/17/24 0008 05/18/24 0318 05/19/24 0337  NA 135   < > 132*   < > 133* 134* 132*  K 3.7   < > 3.4*   < > 3.8 4.0 4.3  CL 102   < > 98   < > 98 101 99  CO2 26   < > 25   < > 25 24 24   GLUCOSE 140*   < > 128*   < > 134* 150* 124*  BUN 47*   < > 28*   < > 46* 25* 22  CREATININE 0.75   < > 0.71   < > 0.99 0.59 0.63  CALCIUM  8.7*   < > 7.9*   < > 7.3* 7.6* 7.5*  PROT 5.6*  --  5.3*  --   --   --  5.5*  ALBUMIN  2.1*  --  1.6*  --   --   --  <1.5*  AST 33  --  51*  --   --   --  54*  ALT 25  --  55*  --   --   --  65*  ALKPHOS 56  --  78  --    --   --  245*  BILITOT 0.7  --  1.8*  --   --   --  1.4*  GFRNONAA >60   < > >60   < > 57* >60 >60  ANIONGAP 7   < > 9   < > 10 9 9    < > = values in this interval not displayed.    Hematology Recent Labs  Lab 05/17/24 1229 05/18/24 0318 05/18/24 1831  WBC 21.7* 17.9* 14.8*  RBC 2.36* 3.34* 3.42*  HGB 6.9* 9.8* 10.0*  HCT 20.9* 29.5* 30.2*  MCV 88.6 88.3 88.3  MCH 29.2 29.3 29.2  MCHC 33.0 33.2 33.1  RDW 18.0* 17.2* 17.2*  PLT 428* 430* 473*   Lipid Panel     Component Value Date/Time   CHOL 196 04/08/2024 1025  TRIG 120 05/19/2024 0340   HDL 59 04/08/2024 1025   CHOLHDL 3.3 04/08/2024 1025   LDLCALC 122 (H) 04/08/2024 1025   LABVLDL 15 04/08/2024 1025   Cardiac Studies  Echocardiogram 05/10/2024:  1. Left ventricular ejection fraction, by estimation, is 60 to 65%. The  left ventricle has normal function. The left ventricle has no regional  wall motion abnormalities. Left ventricular diastolic parameters are  indeterminate.   2. Right ventricular systolic function is normal. The right ventricular  size is normal. There is normal pulmonary artery systolic pressure.   3. The mitral valve was not well visualized. Mild mitral valve  regurgitation. No evidence of mitral stenosis.   4. The tricuspid valve is abnormal. Tricuspid valve regurgitation is mild  to moderate.   5. The aortic valve is tricuspid. Aortic valve regurgitation is not  visualized. No aortic stenosis is present.   6. The inferior vena cava is normal in size with greater than 50%  respiratory variability, suggesting right atrial pressure of 3 mmHg.   Assessment & Plan  #Persistent atrial fibrillation/flutter.   -Remains on Lopressor  12.5 mg twice daily  -Will consolidate lopressor  to Toprol  XL 25mg  daily starting tomorrow -Eliquis  held in light of recurring postsurgical oozing/blood loss.   -CHA2DS2-VASc score is 4.   -Hemoglobin up to 10 after PRBC. -Restart Eliquis  when okay by  surgery  #Primary hypertension - PTA amlodipine  held due to soft BP>> would not restart at this time - Now on Lopressor    #Status post robotic LAR takedown of end colostomy with anastomosis and resection of small bowel intestine with anastomosis and small bowel repair, lysis of adhesions. - Per surgery  No new recs at this time.  Will sign off.  [facility] will sign off.   Medication Recommendations:  Toprol  XL 25mg  daily, Eliquis  when ok with surgery Other recommendations (labs, testing, etc):  none Follow up as an outpatient:  She has Cardiology OV on 6/18 with Sharren Decree, PA   For questions or updates, please contact [facility] Please consult [email or web address] for contact info under   Signed, Gaylyn Keas, MD  05/19/2024, 12:38 PM

## 2024-05-19 NOTE — Progress Notes (Signed)
 PHARMACY - TOTAL PARENTERAL NUTRITION CONSULT NOTE   Indication: Prolonged ileus  Patient Measurements: Height: 5\' 5"  (165.1 cm) Weight: 61 kg (134 lb 7.7 oz) IBW/kg (Calculated) : 57 TPN AdjBW (KG): 61.3 Body mass index is 22.38 kg/m.  Assessment: 33 yoF with history of diverticulitis with perforation and abscess. 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 She requested ostomy takedown and underwent LOA, colostomy takedown, small bowel resection with anastomosis on 5/29. Pharmacy is consulted to dose TPN starting 6/2 for postop ileus.  Glucose / Insulin : no Hx DM, A1c 5.1% -CBG at goal < 150 (6 units SSI/24 hrs) Electrolytes: Na (132) remains slightly low on max concentration of Na in TPN.  -All Others WNL Renal: SCr <1; BUN WNL Hepatic: AST/ALT, Tbili slightly elevated; alb low 1.4. Alkphos 245, up I/O: no mIVF -UOP: 2125 mL, Stool x15 -PO intake: 1370 mL, RN reports eating only minimally  GI Imaging: none GI Surgeries / Procedures: -5/29: LAR, SBR, colostomy takedown, LOA  Central access: Double lumen PICC placed 6/2 TPN start date: 6/2  Nutritional Goals: -Goal TPN rate is 70 mL/hr (provides 84 g of protein and 1720 kcals per day)  RD Assessment:  Estimated Needs Total Energy Estimated Needs: 1600-1750 kcals Total Protein Estimated Needs: 80-95 grams Total Fluid Estimated Needs: >/= 1.6L  Current Nutrition:  Calorie count ongoing -Pt refusing all Boost Plus supplements -Diet: soft, minimal intake reported  Plan:  At 18:00 Continue TPN at goal rate of 70 mL/hr Electrolytes in TPN: No change Na 150 mEq/L (max), K 50 mEq/L, Ca 5 mEq/L, Mg 5 mEq/L, Phos 15 mmol/L.  Cl:Ac 1:1 Continue moderate SSI q8h MVI PO Monitor TPN labs on Mon/Thurs.  Per surgeon, ok to cut TPN rate in half if calorie count meeting > 50% of needs.  Bernett Brill,  PharmD, BCPS Clinical Pharmacist 05/19/2024 7:56 AM

## 2024-05-19 NOTE — Progress Notes (Signed)
 Calorie Count Note  Calorie Count continues  Diet: 6/6: Soft diet Supplements:  -Boost Plus TID- Each supplement provides 360kcal and 14g protein.     6/7: Breakfast: Didn't order breakfast Lunch: no documented intake of lunch Dinner: 360 kcals, 12g Supplements: refusing supplements  Total intake: 360 kcal (22% of minimum estimated needs)  12g protein (15% of minimum estimated needs)  6/8: Breakfast: no documented intake of b-fast Lunch: 255 kcals, 8g protein Dinner: Refused dinner Supplements: Refused supplements, RNs are offering  Total intake: 255 kcal (15% of minimum estimated needs)  8g protein (10% of minimum estimated needs)  Nutrition Dx: Severe Malnutrition related to chronic illness as evidenced by severe fat depletion, severe muscle depletion.   Goal: Pt to meet >/= 90% of their estimated nutrition needs. Needs to meet >50% of needs to wean TPN per surgery.  Intervention:  -Will continue Calorie Count (#3) for next 48 hours -D/c Boost as pt is refusing consistently -Magic cup TID with meals, each supplement provides 290 kcal and 9 grams of protein  -Noted pt is now NPO, will await plan of care -Continue TPN   Arna Better, MS, RD, LDN Inpatient Clinical Dietitian Contact via Secure chat

## 2024-05-19 NOTE — Progress Notes (Signed)
 05/19/2024  Elizabeth Odom [id number] [birth year]  CARE TEAM: PCP: Anthon Kins, MD  Outpatient Care Team: Patient Care Team: Anthon Kins, MD as PCP - General (Internal Medicine) Cody Das, MD as PCP - Cardiology (Cardiology) Lajuan Pila, MD as Consulting Physician (Gastroenterology) Shela Derby, MD as Consulting Physician (General Surgery) Glory Larsen, MD (Neurology) Lahoma Pigg, MD as Consulting Physician (Urology) Sandee Crook Margart Shears, MD as Consulting Physician (Cardiology) Candyce Champagne, MD as Consulting Physician (Colon and Rectal Surgery)  Inpatient Treatment Team: Treatment Team:  Candyce Champagne, MD Ruel Cotta, Heinz Llano, MD [facility], Rounding, MD Verlin Glory, MD Ozell Blunt, MD Francella Infante, RN Janas Meadows, NT Jame Maze, OT Luise Saint, [facility] Creta Dolin, RN   Problem List:   Principal Problem:   Diverticular disease Active Problems:   A-fib [facility])   Restless leg   ABLA (acute blood loss anemia)   Need for emotional support   Acute urinary retention   Mixed hyperlipidemia   Essential hypertension   Diverticular disease of left colon   History of DVT (deep vein thrombosis)   History of pulmonary embolus (PE)   Pre-diabetes   Presence of IVC filter   Stricture of sigmoid colon (HCC)   Protein-calorie malnutrition, severe   Hypokalemia   05/08/2024  POST-OPERATIVE DIAGNOSIS:   COLOSTOMY FOR RESECTION, DESIRE FOR OSTOMY TAKEDOWN RECTAL STRICTURE HISTORY OF DIVERTICULITIS WITH PERFORATION & ABSCESS   PROCEDURE:   -ROBOTIC RECTOSIGMOID RESECTION (LAR) -TAKEDOWN OF END COLOSTOMY WITH ANASTOMOSIS -RESECTION OF SMALL INTESTINE WITH ANASTOMOSIS -SMALL BOWEL REPAIR -LYSIS OF ADHESIONS x 115 MINUTES (66% OF CASE),  -INTRAOPERATIVE ASSESSMENT OF TISSUE VASCULAR PERFUSION USING ICG (indocyanine green ) IMMUNOFLUORESCENCE,  -TRANSVERSUS ABDOMINIS PLANE (TAP) BLOCK - BILATERAL -FLEXIBLE  SIGMOIDOSCOPY   SURGEON:  Eddye Goodie, MD  OR FINDINGS:  Very dense adhesions of small bowel especially to pelvis and retroperitoneum.  Extremely concrete dense adhesions with friable tissue near her old prior small bowel anastomosis and distal jejunum.  Required small bowel repair and required small bowel resection including old anastomosis given severe tissues.  Patient had significant fibrotic stricturing of rectal stump at the mid/distal rectal junction.  Could not be released up.  Ended up doing resection of rectal stump to mid/distal rectal junction.  No obvious metastatic disease on visceral parietal peritoneum or liver.   It is a 29mm EEA anastomosis ( distal descending colon  connected to mid/distal rectal junction.)  It rests 7 cm from the anal verge by rigid proctoscopy.  Assessment [facility] Stay = 11 days) 11 Days Post-Op    Ileus  Recurrent hematochezia on Eliquis  with drop in hemoglobin    Plan:  Confusing picture with some distention but claims she is tolerating solid food and having numerous loose bowel movements.  Will get CAT scan to rule out abscess or other concerns.  She does not have peritonitis.  She had some leukocytosis that is falling down off antibiotics.  Activities.  Hold anticoagulation for now given bleeding.  Transfused 3 units total  Solid diet as tolerated.  Calorie counts.  TPN through PICC line.  I would continue full rate since she is rather malnourished until she can fly on calorie counts.  Nurses notes she only drinks soda and juice and nothing else but patient says she is eating well.  Hard to tease out  Partial urine retention status post Foley replacement 6/4.  Failed 72-hour removal.  Placed back in.  Start tamsulosin.  Will  need outpatient urology follow-up in 2 weeks  No rectal tubes.  Atrial fibrillation postop.  History of a prior event.    Cardiology following.    Started DOAC Eliquis  6/4.  Recurrent lower GI bleeding most  likely at staple line.  Held 6/7.  Continue to hold for now  Rate controlled switch from amlodipine  to metoprolol .  Mildly tachycardic but they feel okay as long as heart rate less than 120 and blood pressure okay.  Defer to cardiology team.  Pain control.  Challenge in this patient   Too sedated with morphine  & Dilaudid .  Fentanyl  for breakthrough but lower dose and lengthen interval since she seems to get sleepy with it at times.  Nurse notes no ask for in the past she had values against worsening peritonitis/pain.  Follow-up.     Scheduled Tylenol  low-dose to allow Norco for breakthrough since she cannot tolerate the other narcotics.    IV muscle relaxant PRN  Rehydration as tolerated.  Stop extra IV fluids and keep on the dry side.  -monitor electrolytes & replace as needed.  Potassium borderline.  Add oral supplementation. Keep K>4, Mg>2, Phos>3  -VTE prophylaxis- SCDs.  Anticoagulation as appropriate  -mobilize as tolerated to help recovery.  Enlist therapies in moderate/high risk patients as appropriate  I think she is stable enough to go back up to the floor.  Reasonable placed on telemetry to keep an eye on cardiac rate.  Cardiology and medicine help appreciated.  I updated the patient's status to the patient and nurse.  Discussed with [facility] internal medicine.  Asked Dr. Sherrod Dolphin to follow at least 1 or 2 more days until I get a send she is stable and can come out of stepdown.  Recommendations were made.  Questions were answered.  She expressed understanding & appreciation.  -Disposition: TBD      I reviewed last 24 h vitals and pain scores, last 48 h intake and output, last 24 h labs and trends, and last 24 h imaging results.  I have reviewed this patient's available data, including medical history, events of note, test results, etc as part of my evaluation.   A significant portion of that time was spent in counseling. Care during the described time interval was provided by  me.  This care required moderate level of medical decision making.  05/19/2024    Subjective: (Chief complaint)  Patient with numerous loose blunted bowel movements last night.  She feels better overall.  No nausea.  Hungry.  Denies much abdominal pain.  A little bit sore.  Did not ask for pain meds last night at all.  Objective:  Vital signs:  Vitals:   05/18/24 1459 05/18/24 1843 05/18/24 2048 05/19/24 0620  BP: 111/65 (!) 116/47 119/65 (!) 108/48  Pulse: (!) 104 96 (!) 106 100  Resp: 19 (!) 22 18 18   Temp: 98.5 F (36.9 C) 99.4 F (37.4 C) 98.6 F (37 C) 99.7 F (37.6 C)  TempSrc: Oral Oral Oral Oral  SpO2: 97% 96% 96% 96%  Weight:      Height:        Last BM Date : 05/18/24  Intake/Output   Yesterday:  06/08 0701 - 06/09 0700 In: 3044.1 [P.O.:1370; I.V.:1674.1] Out: 2125 [Urine:2125] This shift:  No intake/output data recorded.  Bowel function:  Flatus: YES  BM:  YES  Drain: (No drain)    Physical Exam:  General: Pt awake and chatty.  Still mild mumbling but speaking much better overall.  Eyes: PERRL, normal EOM.  Sclera clear.  No icterus Neuro: CN II-XII intact w/o focal sensory/motor deficits. Lymph: No head/neck/groin lymphadenopathy Psych:  No delerium/psychosis/paranoia.  Oriented x 4 HENT: Normocephalic, Mucus membranes moist.  No thrush Neck: Supple, No tracheal deviation.  No obvious thyromegaly Chest: No pain to chest wall compression.  Good respiratory excursion.  No audible wheezing CV:  Pulses intact.  Regular rhythm.  No major extremity edema MS: Normal AROM mjr joints.  No obvious deformity  Abdomen:  Somewhat firm.  Very distended.  Nontender.  No guarding.  No complaints of pain to percussion or benching or cough.  No evidence of peritonitis. Old ostomy incision with normal healing ridge.  Right lower quadrant old drain site with minimal serous drainage    No incarcerated hernias.  Rectal: deferred today  Ext:   No  deformity.  No mjr edema.  No cyanosis Skin: No petechiae / purpurea.  No major sores.  Warm and dry    Results:   Cultures: Recent Results (from the past 720 hours)  Urine Culture     Status: Abnormal   Collection Time: 05/09/24  8:30 PM   Specimen: Urine, Clean Catch  Result Value Ref Range Status   Specimen Description   Final    URINE, CLEAN CATCH Performed at [facility], [address] [place], [place] [address]    Special Requests   Final    NONE Performed at [facility], [address] [place], [place] [address]    Culture (A)  Final    <10,000 COLONIES/mL INSIGNIFICANT GROWTH Performed at [facility], [address] [place], [place] [address]    Report Status 05/11/2024 FINAL  Final    Labs: Results for orders placed or performed during the hospital encounter of 05/08/24 (from the past 48 hours)  Glucose, capillary     Status: Abnormal   Collection Time: 05/17/24  8:01 AM  Result Value Ref Range   Glucose-Capillary 133 (H) 70 - 99 mg/dL    Comment: Glucose reference range applies only to samples taken after fasting for at least 8 hours.  Prepare RBC (crossmatch)     Status: None   Collection Time: 05/17/24 11:32 AM  Result Value Ref Range   Order Confirmation      ORDER PROCESSED BY BLOOD BANK Performed at [facility], [address] [place], [place] [address]   Glucose, capillary     Status: Abnormal   Collection Time: 05/17/24 11:39 AM  Result Value Ref Range   Glucose-Capillary 149 (H) 70 - 99 mg/dL    Comment: Glucose reference range applies only to samples taken after fasting for at least 8 hours.  CBC     Status: Abnormal   Collection Time: 05/17/24 12:29 PM  Result Value Ref Range   WBC 21.7 (H) 4.0 - 10.5 K/uL   RBC 2.36 (L) 3.87 - 5.11 MIL/uL   Hemoglobin 6.9 (LL) 12.0 - 15.0 g/dL    Comment: REPEATED TO VERIFY THIS CRITICAL RESULT HAS VERIFIED AND BEEN CALLED TO ARMSTRONG, P  RN BY GLIBERT, LATRICE ON 06 07 2025 AT 1356, AND HAS BEEN READ BACK.     HCT 20.9 (L) 36.0 - 46.0 %   MCV 88.6 80.0 - 100.0 fL   MCH 29.2 26.0 - 34.0 pg   MCHC 33.0 30.0 - 36.0 g/dL   RDW 57.8 (H) 46.9 - 62.9 %   Platelets 428 (H) 150 - 400  K/uL   nRBC 0.4 (H) 0.0 - 0.2 %    Comment: Performed at [facility], [address], [place], [place] [address]  Glucose, capillary     Status: Abnormal   Collection Time: 05/17/24  5:16 PM  Result Value Ref Range   Glucose-Capillary 143 (H) 70 - 99 mg/dL    Comment: Glucose reference range applies only to samples taken after fasting for at least 8 hours.  Glucose, capillary     Status: Abnormal   Collection Time: 05/18/24 12:17 AM  Result Value Ref Range   Glucose-Capillary 150 (H) 70 - 99 mg/dL    Comment: Glucose reference range applies only to samples taken after fasting for at least 8 hours.  CBC     Status: Abnormal   Collection Time: 05/18/24  3:18 AM  Result Value Ref Range   WBC 17.9 (H) 4.0 - 10.5 K/uL   RBC 3.34 (L) 3.87 - 5.11 MIL/uL   Hemoglobin 9.8 (L) 12.0 - 15.0 g/dL    Comment: REPEATED TO VERIFY POST TRANSFUSION SPECIMEN    HCT 29.5 (L) 36.0 - 46.0 %   MCV 88.3 80.0 - 100.0 fL   MCH 29.3 26.0 - 34.0 pg   MCHC 33.2 30.0 - 36.0 g/dL   RDW 60.4 (H) 54.0 - 98.1 %   Platelets 430 (H) 150 - 400 K/uL   nRBC 0.9 (H) 0.0 - 0.2 %    Comment: Performed at [facility], [address], [place], [place] [address]  Basic metabolic panel     Status: Abnormal   Collection Time: 05/18/24  3:18 AM  Result Value Ref Range   Sodium 134 (L) 135 - 145 mmol/L   Potassium 4.0 3.5 - 5.1 mmol/L   Chloride 101 98 - 111 mmol/L   CO2 24 22 - 32 mmol/L   Glucose, Bld 150 (H) 70 - 99 mg/dL    Comment: Glucose reference range applies only to samples taken after fasting for at least 8 hours.   BUN 25 (H) 8 - 23 mg/dL   Creatinine, Ser 8.29 0.44 - 1.00 mg/dL   Calcium  7.6 (L) 8.9 - 10.3 mg/dL   GFR,  Estimated >56 >21 mL/min    Comment: (NOTE) Calculated using the CKD-EPI Creatinine Equation (2021)    Anion gap 9 5 - 15    Comment: Performed at [facility], [address], [place], [place] [address]  Magnesium      Status: None   Collection Time: 05/18/24  3:18 AM  Result Value Ref Range   Magnesium  2.0 1.7 - 2.4 mg/dL    Comment: Performed at [facility], [address], [place], [place] [address]  Phosphorus     Status: None   Collection Time: 05/18/24  3:18 AM  Result Value Ref Range   Phosphorus 2.5 2.5 - 4.6 mg/dL    Comment: Performed at [facility], [address], [place], [place] [address]  Glucose, capillary     Status: Abnormal   Collection Time: 05/18/24  8:02 AM  Result Value Ref Range   Glucose-Capillary 143 (H) 70 - 99 mg/dL    Comment: Glucose reference range applies only to samples taken after fasting for at least 8 hours.  Glucose, capillary     Status: Abnormal   Collection Time: 05/18/24  4:10 PM  Result Value Ref Range   Glucose-Capillary 123 (H) 70 - 99 mg/dL    Comment: Glucose reference range  applies only to samples taken after fasting for at least 8 hours.  CBC     Status: Abnormal   Collection Time: 05/18/24  6:31 PM  Result Value Ref Range   WBC 14.8 (H) 4.0 - 10.5 K/uL   RBC 3.42 (L) 3.87 - 5.11 MIL/uL   Hemoglobin 10.0 (L) 12.0 - 15.0 g/dL   HCT 84.6 (L) 96.2 - 95.2 %   MCV 88.3 80.0 - 100.0 fL   MCH 29.2 26.0 - 34.0 pg   MCHC 33.1 30.0 - 36.0 g/dL   RDW 84.1 (H) 32.4 - 40.1 %   Platelets 473 (H) 150 - 400 K/uL   nRBC 1.5 (H) 0.0 - 0.2 %    Comment: Performed at [facility], [address] [place], [place] [address]  Glucose, capillary     Status: Abnormal   Collection Time: 05/18/24 11:50 PM  Result Value Ref Range   Glucose-Capillary 127 (H) 70 - 99 mg/dL    Comment: Glucose reference range applies only to samples taken after fasting for at least 8 hours.   Comprehensive metabolic panel     Status: Abnormal   Collection Time: 05/19/24  3:37 AM  Result Value Ref Range   Sodium 132 (L) 135 - 145 mmol/L   Potassium 4.3 3.5 - 5.1 mmol/L   Chloride 99 98 - 111 mmol/L   CO2 24 22 - 32 mmol/L   Glucose, Bld 124 (H) 70 - 99 mg/dL    Comment: Glucose reference range applies only to samples taken after fasting for at least 8 hours.   BUN 22 8 - 23 mg/dL   Creatinine, Ser 3.66 0.44 - 1.00 mg/dL   Calcium  7.5 (L) 8.9 - 10.3 mg/dL   Total Protein 5.5 (L) 6.5 - 8.1 g/dL   Albumin  <1.5 (L) 3.5 - 5.0 g/dL   AST 54 (H) 15 - 41 U/L   ALT 65 (H) 0 - 44 U/L   Alkaline Phosphatase 245 (H) 38 - 126 U/L   Total Bilirubin 1.4 (H) 0.0 - 1.2 mg/dL   GFR, Estimated >44 >03 mL/min    Comment: (NOTE) Calculated using the CKD-EPI Creatinine Equation (2021)    Anion gap 9 5 - 15    Comment: Performed at [facility], [address] [place], [place] [address]  Magnesium      Status: None   Collection Time: 05/19/24  3:37 AM  Result Value Ref Range   Magnesium  2.1 1.7 - 2.4 mg/dL    Comment: Performed at [facility], [address], [place], [place] [address]  Phosphorus     Status: None   Collection Time: 05/19/24  3:37 AM  Result Value Ref Range   Phosphorus 3.5 2.5 - 4.6 mg/dL    Comment: Performed at [facility], [address] [place], [place] [address]  Triglycerides     Status: None   Collection Time: 05/19/24  3:40 AM  Result Value Ref Range   Triglycerides 120 <150 mg/dL    Comment: Performed at [facility], [address], [place], [place] [address]    Imaging / Studies: No results found.   Medications / Allergies: per chart  Antibiotics: Anti-infectives (From admission, onward)    Start     Dose/Rate Route Frequency Ordered Stop   05/09/24 0900  erythromycin  250 mg in sodium chloride  0.9 % 100 mL IVPB        250 mg 100 mL/hr over 60  Minutes Intravenous Every  8 hours 05/09/24 0732 05/11/24 0044   05/08/24 2200  cefoTEtan  (CEFOTAN ) 2 g in sodium chloride  0.9 % 100 mL IVPB        2 g 200 mL/hr over 30 Minutes Intravenous Every 12 hours 05/08/24 1759 05/09/24 0749   05/08/24 1400  neomycin  (MYCIFRADIN ) tablet 1,000 mg  Status:  Discontinued       Placed in "And" Linked Group   1,000 mg Oral 3 times per day 05/08/24 1119 05/08/24 1120   05/08/24 1400  metroNIDAZOLE  (FLAGYL ) tablet 1,000 mg  Status:  Discontinued       Placed in "And" Linked Group   1,000 mg Oral 3 times per day 05/08/24 1119 05/08/24 1120   05/08/24 1130  cefoTEtan  (CEFOTAN ) 2 g in sodium chloride  0.9 % 100 mL IVPB        2 g 200 mL/hr over 30 Minutes Intravenous On call to O.R. 05/08/24 1119 05/09/24 0749         Note: Portions of this report may have been transcribed using voice recognition software. Every effort was made to ensure accuracy; however, inadvertent computerized transcription errors may be present.   Any transcriptional errors that result from this process are unintentional.    Eddye Goodie, MD, FACS, MASCRS Esophageal, Gastrointestinal & Colorectal Surgery Robotic and Minimally Invasive Surgery  [facility] [address] [place], [place] [address] [phone] Fax [phone] Main  CONTACT INFORMATION: Weekday (9AM-5PM): Call [facility] main office at [phone] Weeknight (5PM-9AM) or Weekend/Holiday: Check EPIC "Web Links" tab & use "AMION" (password " [email or web address]") for General Surgery [facility] coverage  Please, DO NOT use SecureChat  (it is not reliable communication to reach operating surgeons & will lead to a delay in care).   Epic staff messaging available for outptient concerns needing 1-2 business day response.      05/19/2024  7:58 AM

## 2024-05-19 NOTE — Progress Notes (Signed)
 [facility]  PROGRESS NOTE  Elizabeth Odom WJX:[id number] DOB: 1941-02-27 DOA: 05/08/2024 PCP: Anthon Kins, MD   Brief HPI:    83 year old female, lives independently, medical history significant for remote history of A-fib evaluated with a loop recorder which was negative, at some point was on anticoagulation, had some bleeding and this was discontinued, anemia, PACs, HTN, mild cognitive impairment, syncope, large bowel obstruction prompting colon resection, creation 09/2020 and at that hospitalization found to have left leg DVT, small PE when given recent surgery with inability to use anticoagulation, had IVC filter placed that was subsequently removed, underwent elective robotic rectosigmoid resection, colostomy takedown with bowel anastomosis, extensive lysis of additions on 5/29 after preop cardiac clearance.  On 5/30 even in, patient developed AMS after polypharmacy along with A-fib with RVR and TRH was consulted.  Patient was transferred to stepdown unit.  Course complicated by A-fib with RVR, cardiology consulting, on IV beta-blockers.  Also hypotension, rectal bleed and syncope x 1 on 6/1, prolonged postop ileus, currently with NG tube, PICC line and TPN.    Assessment/Plan:   Postop A-fib with RVR/PVCs Remote history of A-fib not on rate control meds or anticoagulation preop Seen by Dr. Filiberto Hug in preop clearance on 04/08/2024 Likely stress response due to recent surgery, multiple meds, postop acute blood loss anemia. TTE with preserved LVEF.  TSH normal. Cardiology following, started on Toprol  XL 25 mg at bedtime Started on apixaban  for anticoagulation; currently on hold due to recurrent GI bleed -Cardiology has signed off, recommend to restart Eliquis  when okay with surgery   Acute blood loss anemia - S/p 4 units of blood transfusion - Hemoglobin stable at 9.7 - Eliquis  currently on hold as above   Acute kidney injury Resolved, likely in setting of hypotension    Syncope x 1 On 6/1 when patient was on bedside commode Suspected due to hypotension or vasovagal If patient drives, she must be counseled not to drive x 6 months. Echo with preserved LVEF Telemetry with A-fib with controlled ventricular rate.   Postop ileus Management per primary service -Started on TPN    Urinary retention -Status post Foley placement on 6/4 -Plan for voiding trial on 6/7; if fails then Foley will be replaced and patient can follow-up with urology .   Hypophosphatemia Replaced by pharmacy in TPN and monitor.   Leukocytosis May be stress response.  No clinical concern for infection apart from recent abdominal surgery-defer antibiotics for that to primary service. Completed IV azithromycin for GI dysmotility No clinical UTI.  Urine microscopy without concern for UTI.  Chest x-ray without pneumonia Fluctuating but overall stable   S/p colostomy takedown Management per primary service.     Mild delirium -resolved Ongoing on and off, multifactorial due to acute illness, recent surgery, meds, ICU/hospital delirium Delirium precautions Minimize opioids and sedatives.    Medications     acetaminophen   500 mg Oral TID WC & HS   ascorbic acid   250 mg Oral Daily   Chlorhexidine  Gluconate Cloth  6 each Topical Daily   cyanocobalamin   1,000 mcg Oral Daily   insulin  aspart  0-15 Units Subcutaneous Q8H   lactose free nutrition  237 mL Oral TID WC   liver oil-zinc  oxide   Topical BID   metoprolol  tartrate  12.5 mg Oral BID   multivitamin with minerals  1 tablet Oral Daily   pantoprazole   40 mg Oral Daily   polycarbophil  625 mg Oral BID   sodium chloride  flush  10-40 mL Intracatheter Q12H   sodium chloride  flush  3 mL Intravenous Q12H     Data Reviewed:   CBG:  Recent Labs  Lab 05/17/24 1716 05/18/24 0017 05/18/24 0802 05/18/24 1610 05/18/24 2350  GLUCAP 143* 150* 143* 123* 127*    SpO2: 96 % O2 Flow Rate (L/min): 6 L/min    Vitals:    05/18/24 1459 05/18/24 1843 05/18/24 2048 05/19/24 0620  BP: 111/65 (!) 116/47 119/65 (!) 108/48  Pulse: (!) 104 96 (!) 106 100  Resp: 19 (!) 22 18 18   Temp: 98.5 F (36.9 C) 99.4 F (37.4 C) 98.6 F (37 C) 99.7 F (37.6 C)  TempSrc: Oral Oral Oral Oral  SpO2: 97% 96% 96% 96%  Weight:      Height:          Data Reviewed:  Basic Metabolic Panel: Recent Labs  Lab 05/14/24 0325 05/15/24 0339 05/16/24 0601 05/17/24 0008 05/18/24 0318 05/19/24 0337  NA 135 132* 132* 133* 134* 132*  K 3.5 3.4* 4.0 3.8 4.0 4.3  CL 100 98 98 98 101 99  CO2 25 25 23 25 24 24   GLUCOSE 142* 128* 160* 134* 150* 124*  BUN 34* 28* 36* 46* 25* 22  CREATININE 0.78 0.71 0.88 0.99 0.59 0.63  CALCIUM  8.2* 7.9* 7.8* 7.3* 7.6* 7.5*  MG 2.0 1.9  --  2.0 2.0 2.1  PHOS 2.6 3.0  --  3.4 2.5 3.5    CBC: Recent Labs  Lab 05/16/24 0601 05/17/24 0008 05/17/24 1229 05/18/24 0318 05/18/24 1831  WBC 32.7* 24.4* 21.7* 17.9* 14.8*  HGB 6.1* 7.3* 6.9* 9.8* 10.0*  HCT 19.6* 22.2* 20.9* 29.5* 30.2*  MCV 98.5 89.2 88.6 88.3 88.3  PLT 505* 393 428* 430* 473*    LFT Recent Labs  Lab 05/13/24 0521 05/15/24 0339 05/19/24 0337  AST 33 51* 54*  ALT 25 55* 65*  ALKPHOS 56 78 245*  BILITOT 0.7 1.8* 1.4*  PROT 5.6* 5.3* 5.5*  ALBUMIN  2.1* 1.6* <1.5*     Antibiotics: Anti-infectives (From admission, onward)    Start     Dose/Rate Route Frequency Ordered Stop   05/09/24 0900  erythromycin  250 mg in sodium chloride  0.9 % 100 mL IVPB        250 mg 100 mL/hr over 60 Minutes Intravenous Every 8 hours 05/09/24 0732 05/11/24 0044   05/08/24 2200  cefoTEtan  (CEFOTAN ) 2 g in sodium chloride  0.9 % 100 mL IVPB        2 g 200 mL/hr over 30 Minutes Intravenous Every 12 hours 05/08/24 1759 05/09/24 0749   05/08/24 1400  neomycin  (MYCIFRADIN ) tablet 1,000 mg  Status:  Discontinued       Placed in "And" Linked Group   1,000 mg Oral 3 times per day 05/08/24 1119 05/08/24 1120   05/08/24 1400  metroNIDAZOLE  (FLAGYL )  tablet 1,000 mg  Status:  Discontinued       Placed in "And" Linked Group   1,000 mg Oral 3 times per day 05/08/24 1119 05/08/24 1120   05/08/24 1130  cefoTEtan  (CEFOTAN ) 2 g in sodium chloride  0.9 % 100 mL IVPB        2 g 200 mL/hr over 30 Minutes Intravenous On call to O.R. 05/08/24 1119 05/09/24 0749        Subjective   Still complains of abdominal pain   Objective    Physical Examination:  Appears in no acute distress S1-S2, regular Abdomen is soft, distended, no organomegaly  Status is: Inpatient:      Pressure Injury 10/08/23 Buttocks Right;Mid Stage 2 -  Partial thickness loss of dermis presenting as a shallow open injury with a red, pink wound bed without slough. (Active)  10/08/23 0800  Location: Buttocks  Location Orientation: Right;Mid  Staging: Stage 2 -  Partial thickness loss of dermis presenting as a shallow open injury with a red, pink wound bed without slough.  Wound Description (Comments):   Present on Admission:         Artemisia Auvil S Hyman Crossan   [facility] If 7PM-7AM, please contact night-coverage at [email or web address], Office  [phone]   05/19/2024, 7:58 AM  LOS: 11 days

## 2024-05-20 ENCOUNTER — Inpatient Hospital Stay (HOSPITAL_COMMUNITY)

## 2024-05-20 DIAGNOSIS — I4891 Unspecified atrial fibrillation: Secondary | ICD-10-CM | POA: Diagnosis not present

## 2024-05-20 DIAGNOSIS — I10 Essential (primary) hypertension: Secondary | ICD-10-CM | POA: Diagnosis not present

## 2024-05-20 LAB — COMPREHENSIVE METABOLIC PANEL WITH GFR
ALT: 47 U/L — ABNORMAL HIGH (ref 0–44)
AST: 32 U/L (ref 15–41)
Albumin: <1.5 g/dL — ABNORMAL LOW (ref 3.5–5.0)
Alkaline Phosphatase: 269 U/L — ABNORMAL HIGH (ref 38–126)
Anion gap: 9 (ref 5–15)
BUN: 23 mg/dL (ref 8–23)
CO2: 24 mmol/L (ref 22–32)
Calcium: 7.7 mg/dL — ABNORMAL LOW (ref 8.9–10.3)
Chloride: 101 mmol/L (ref 98–111)
Creatinine, Ser: 0.65 mg/dL (ref 0.44–1.00)
GFR, Estimated: >60 mL/min (ref >60)
Glucose, Bld: 129 mg/dL — ABNORMAL HIGH (ref 70–99)
Potassium: 4.1 mmol/L (ref 3.5–5.1)
Sodium: 134 mmol/L — ABNORMAL LOW (ref 135–145)
Total Bilirubin: 1.2 mg/dL (ref 0.0–1.2)
Total Protein: 5.8 g/dL — ABNORMAL LOW (ref 6.5–8.1)

## 2024-05-20 LAB — CBC
HCT: 30.6 % — ABNORMAL LOW (ref 36.0–46.0)
Hemoglobin: 9.7 g/dL — ABNORMAL LOW (ref 12.0–15.0)
MCH: 29.3 pg (ref 26.0–34.0)
MCHC: 31.7 g/dL (ref 30.0–36.0)
MCV: 92.4 fL (ref 80.0–100.0)
Platelets: 541 10*3/uL — ABNORMAL HIGH (ref 150–400)
RBC: 3.31 MIL/uL — ABNORMAL LOW (ref 3.87–5.11)
RDW: 17.3 % — ABNORMAL HIGH (ref 11.5–15.5)
WBC: 13.8 10*3/uL — ABNORMAL HIGH (ref 4.0–10.5)
nRBC: 0.1 % (ref 0.0–0.2)

## 2024-05-20 LAB — GLUCOSE, CAPILLARY
Glucose-Capillary: 131 mg/dL — ABNORMAL HIGH (ref 70–99)
Glucose-Capillary: 130 mg/dL — ABNORMAL HIGH (ref 70–99)

## 2024-05-20 MED ORDER — CARMEX CLASSIC LIP BALM EX OINT
TOPICAL_OINTMENT | CUTANEOUS | Status: DC | PRN
  Filled 2024-05-20 (×2): qty 10

## 2024-05-20 MED ORDER — MIDAZOLAM HCL 2 MG/2ML IJ SOLN
INTRAMUSCULAR | Status: AC
Start: 2024-05-20 — End: ?
  Filled 2024-05-20: qty 2

## 2024-05-20 MED ORDER — FLUMAZENIL 0.5 MG/5ML IV SOLN
INTRAVENOUS | Status: AC
  Filled 2024-05-20: qty 5

## 2024-05-20 MED ORDER — FENTANYL CITRATE (PF) 100 MCG/2ML IJ SOLN
INTRAMUSCULAR | Status: AC | PRN
  Administered 2024-05-20: 25 ug via INTRAVENOUS

## 2024-05-20 MED ORDER — LIDOCAINE HCL 1 % IJ SOLN
INTRAMUSCULAR | Status: AC | PRN
  Administered 2024-05-20: 10 mL via INTRADERMAL

## 2024-05-20 MED ORDER — NALOXONE HCL 0.4 MG/ML IJ SOLN
INTRAMUSCULAR | Status: AC
Start: 2024-05-20 — End: ?
  Filled 2024-05-20: qty 1

## 2024-05-20 MED ORDER — MIDAZOLAM HCL 2 MG/2ML IJ SOLN
INTRAMUSCULAR | Status: AC | PRN
  Administered 2024-05-20: .5 mg via INTRAVENOUS

## 2024-05-20 MED ORDER — SODIUM CHLORIDE 0.9 % IV SOLN
INTRAVENOUS | Status: AC | PRN
  Administered 2024-05-20: 75 mL/h via INTRAVENOUS

## 2024-05-20 MED ORDER — TRAVASOL 10 % IV SOLN
INTRAVENOUS | Status: AC
  Filled 2024-05-20: qty 840

## 2024-05-20 MED ORDER — SODIUM CHLORIDE 0.9% FLUSH
5.0000 mL | Freq: Three times a day (TID) | INTRAVENOUS | Status: DC
  Administered 2024-05-20 – 2024-06-04 (×39): 5 mL

## 2024-05-20 MED ORDER — SODIUM CHLORIDE 0.9 % IV SOLN
INTRAVENOUS | Status: AC
  Filled 2024-05-20: qty 250

## 2024-05-20 MED ORDER — FENTANYL CITRATE (PF) 100 MCG/2ML IJ SOLN
INTRAMUSCULAR | Status: AC
  Filled 2024-05-20: qty 2

## 2024-05-20 NOTE — Plan of Care (Signed)
  Problem: Education: Goal: Verbalization of understanding of the causes of altered bowel function will improve Outcome: Progressing

## 2024-05-20 NOTE — Progress Notes (Addendum)
 05/20/2024  Elizabeth Odom [id number] 07-01-1941  CARE TEAM: PCP: Anthon Kins, MD  Outpatient Care Team: Patient Care Team: Anthon Kins, MD as PCP - General (Internal Medicine) Cody Das, MD as PCP - Cardiology (Cardiology) Lajuan Pila, MD as Consulting Physician (Gastroenterology) Shela Derby, MD as Consulting Physician (General Surgery) Glory Larsen, MD (Neurology) Lahoma Pigg, MD as Consulting Physician (Urology) Sandee Crook Margart Shears, MD as Consulting Physician (Cardiology) Candyce Champagne, MD as Consulting Physician (Colon and Rectal Surgery)  Inpatient Treatment Team: Treatment Team:  Candyce Champagne, MD Ruel Cotta, Heinz Llano, MD Verlin Glory, MD Ozell Blunt, MD Chesley Cost, PTA Consuela Denier, RN Jame Maze, OT   Problem List:   Principal Problem:   Diverticular disease Active Problems:   A-fib (HCC)   Restless leg   ABLA (acute blood loss anemia)   Need for emotional support   Acute urinary retention   Mixed hyperlipidemia   Essential hypertension   Diverticular disease of left colon   History of DVT (deep vein thrombosis)   History of pulmonary embolus (PE)   Pre-diabetes   Presence of IVC filter   Stricture of sigmoid colon (HCC)   Protein-calorie malnutrition, severe   Hypokalemia   05/08/2024  POST-OPERATIVE DIAGNOSIS:   COLOSTOMY FOR RESECTION, DESIRE FOR OSTOMY TAKEDOWN RECTAL STRICTURE HISTORY OF DIVERTICULITIS WITH PERFORATION & ABSCESS   PROCEDURE:   -ROBOTIC RECTOSIGMOID RESECTION (LAR) -TAKEDOWN OF END COLOSTOMY WITH ANASTOMOSIS -RESECTION OF SMALL INTESTINE WITH ANASTOMOSIS -SMALL BOWEL REPAIR -LYSIS OF ADHESIONS x 115 MINUTES (66% OF CASE),  -INTRAOPERATIVE ASSESSMENT OF TISSUE VASCULAR PERFUSION USING ICG (indocyanine green ) IMMUNOFLUORESCENCE,  -TRANSVERSUS ABDOMINIS PLANE (TAP) BLOCK - BILATERAL -FLEXIBLE SIGMOIDOSCOPY   SURGEON:  Eddye Goodie, MD  OR FINDINGS:  Very dense  adhesions of small bowel especially to pelvis and retroperitoneum.  Extremely concrete dense adhesions with friable tissue near her old prior small bowel anastomosis and distal jejunum.  Required small bowel repair and required small bowel resection including old anastomosis given severe tissues.  Patient had significant fibrotic stricturing of rectal stump at the mid/distal rectal junction.  Could not be released up.  Ended up doing resection of rectal stump to mid/distal rectal junction.  No obvious metastatic disease on visceral parietal peritoneum or liver.   It is a 29mm EEA anastomosis ( distal descending colon  connected to mid/distal rectal junction.)  It rests 7 cm from the anal verge by rigid proctoscopy.  Assessment [facility] Stay = 12 days) 12 Days Post-Op    Ileus  Fluid collections raises suspicion of contained leak    Plan:  Will need to get this under better control.  IV antibiotics.  Do Zosyn .  IR to place drains in anterior,suprapubic collection as well as transgluteal drain in pelvis.  D/w Dr Burna Carrier with [facility] radiology/ Interventional.  See if we can divert leaking contents and gas and get things under control.  Despite her CAT scan, she does not have diffuse peritonitis nor is in shock.  She is severely malnourished and I think operative intervention right now could risk dehiscence and leak.  If she does not turn around or worsens, she will require operative exploration with washout and fecal diversion with either diverting loop ileostomy versus takedown of anastomosis if it looks ischemic or necrotic or a significant dehiscence.    Patient is desperate to drink and eat but I noted that was not a good idea.  She wishes to hold off on  NG tube.  We will try and respect that for now but low threshold to replace it if needed.  Despite her fluid and gas pockets she is having some bowel movements.  Hold on anticoagulation with hematochezia.  Transfused 3 units total  TPN  through PICC line.  I would continue full rate since she is rather malnourished until she can fly on calorie counts.  Nurses notes she only drinks soda and juice and nothing else but patient says she is eating well.  Hard to tease out  Partial urine retention status post Foley replacement 6/4.  Failed 72-hour removal.  Placed back in.  Start tamsulosin.  Will need outpatient urology follow-up in 2 weeks  No rectal tubes.  Atrial fibrillation postop.  History of a prior event.    Cardiology following.    Started DOAC Eliquis  6/4.  Recurrent lower GI bleeding most likely at staple line.  Held 6/7.  Continue to hold for now  Rate controlled switch from amlodipine  to metoprolol .  Mildly tachycardic but they feel okay as long as heart rate less than 120 and blood pressure okay.  Defer to cardiology team.  Pain control.  Challenge in this patient   Too sedated with morphine  & Dilaudid .  Fentanyl  for breakthrough but lower dose and lengthen interval since she seems to get sleepy with it at times.  Nurse notes no ask for in the past she had values against worsening peritonitis/pain.  Follow-up.     Scheduled Tylenol  low-dose to allow Norco for breakthrough since she cannot tolerate the other narcotics.    IV muscle relaxant PRN  Rehydration as tolerated.    -monitor electrolytes & replace as needed.  Potassium borderline.  Add oral supplementation.  Keep K>4, Mg>2, Phos>3  -VTE prophylaxis- SCDs.  Anticoagulation as appropriate  -mobilize as tolerated to help recovery.  Enlist therapies in moderate/high risk patients as appropriate  I think she is stable enough to go back up to the floor.  Reasonable placed on telemetry to keep an eye on cardiac rate.  Cardiology and medicine help appreciated.  I updated the patient's status to the patient and nurse.  Discussed with [facility] internal medicine.  Asked Dr. Sherrod Dolphin to follow at least 1 or 2 more days until I get a send she is stable and can come out  of stepdown.  Recommendations were made.  Questions were answered.  She expressed understanding & appreciation.  -Disposition: TBD      I reviewed last 24 h vitals and pain scores, last 48 h intake and output, last 24 h labs and trends, and last 24 h imaging results.  I have reviewed this patient's available data, including medical history, events of note, test results, etc as part of my evaluation.   A significant portion of that time was spent in counseling. Care during the described time interval was provided by me.  This care required moderate level of medical decision making.  05/20/2024    Subjective: (Chief complaint)  CAT scan done which shows gas pockets in abdomen and fluid and gas pockets in pelvis suspicious for colorectal anastomotic disruption/side leak.  Antibiotics started.  Drain placement ordered.  Patient sleeping but awakens.  Wants something to drink.  Annoyed that she cannot watch TV but has throat with her.  Is oriented x 4 though.  Objective:  Vital signs:  Vitals:   05/19/24 1804 05/19/24 2110 05/20/24 0500 05/20/24 0536  BP:  108/61  (!) 110/57  Pulse:  79  (!)  108  Resp:  18  18  Temp: 99.5 F (37.5 C) 98.9 F (37.2 C)  99.3 F (37.4 C)  TempSrc: Oral Oral  Oral  SpO2:  94%  95%  Weight:   61.2 kg   Height:        Last BM Date : 05/19/24  Intake/Output   Yesterday:  06/09 0701 - 06/10 0700 In: 1872.2 [P.O.:120; I.V.:1641.1; IV Piggyback:111.1] Out: 2350 [Urine:2350] This shift:  Total I/O In: 894.4 [I.V.:833.4; IV Piggyback:61.1] Out: 1450 [Urine:1450]  Bowel function:  Flatus: YES  BM:  YES  Drain: (No drain)    Physical Exam:  General: Resting but easily awakens.  Talkative ask him questions.  Tired.   Still mild mumbling but speaking okay.    Eyes: PERRL, normal EOM.  Sclera clear.  No icterus Neuro: CN II-XII intact w/o focal sensory/motor deficits.  All extremities.  No facial droop. Lymph: No head/neck/groin  lymphadenopathy Psych:  No delerium/psychosis/paranoia.  Oriented x 4 HENT: Normocephalic, Mucus membranes moist.  No thrush Neck: Supple, No tracheal deviation.  No obvious thyromegaly Chest: No pain to chest wall compression.  Good respiratory excursion.  No audible wheezing CV:  Pulses intact.  Regular rhythm.  No major extremity edema MS: Normal AROM mjr joints.  No obvious deformity  Abdomen:  Somewhat firm.  Very distended.  Patient claims lower abdominal discomfort but not in upper abdomen or side..  No guarding.  No complaints of pain to percussion or benching or cough.  No evidence of peritonitis. Old ostomy incision with normal healing ridge.  Right lower quadrant old drain site with minimal serous drainage    No incarcerated hernias.  Rectal: deferred today  Ext:   No deformity.  No mjr edema.  No cyanosis Skin: No petechiae / purpurea.  No major sores.  Warm and dry    Results:   Cultures: Recent Results (from the past 720 hours)  Urine Culture     Status: Abnormal   Collection Time: 05/09/24  8:30 PM   Specimen: Urine, Clean Catch  Result Value Ref Range Status   Specimen Description   Final    URINE, CLEAN CATCH Performed at [facility], [address] [place], [place] [address]    Special Requests   Final    NONE Performed at [facility], [address], [place], [place] [address]    Culture (A)  Final    <10,000 COLONIES/mL INSIGNIFICANT GROWTH Performed at [facility], [address], [place], [place] [address]    Report Status 05/11/2024 FINAL  Final    Labs: Results for orders placed or performed during the hospital encounter of 05/08/24 (from the past 48 hours)  Glucose, capillary     Status: Abnormal   Collection Time: 05/18/24  8:02 AM  Result Value Ref Range   Glucose-Capillary 143 (H) 70 - 99 mg/dL    Comment: Glucose reference range applies only to samples taken after fasting for at least 8  hours.  Glucose, capillary     Status: Abnormal   Collection Time: 05/18/24  4:10 PM  Result Value Ref Range   Glucose-Capillary 123 (H) 70 - 99 mg/dL    Comment: Glucose reference range applies only to samples taken after fasting for at least 8 hours.  CBC     Status: Abnormal   Collection Time: 05/18/24  6:31 PM  Result Value Ref Range   WBC 14.8 (H) 4.0 - 10.5 K/uL  RBC 3.42 (L) 3.87 - 5.11 MIL/uL   Hemoglobin 10.0 (L) 12.0 - 15.0 g/dL   HCT 16.1 (L) 09.6 - 04.5 %   MCV 88.3 80.0 - 100.0 fL   MCH 29.2 26.0 - 34.0 pg   MCHC 33.1 30.0 - 36.0 g/dL   RDW 40.9 (H) 81.1 - 91.4 %   Platelets 473 (H) 150 - 400 K/uL   nRBC 1.5 (H) 0.0 - 0.2 %    Comment: Performed at [facility], [address], [place], [place] [address]  Glucose, capillary     Status: Abnormal   Collection Time: 05/18/24 11:50 PM  Result Value Ref Range   Glucose-Capillary 127 (H) 70 - 99 mg/dL    Comment: Glucose reference range applies only to samples taken after fasting for at least 8 hours.  Comprehensive metabolic panel     Status: Abnormal   Collection Time: 05/19/24  3:37 AM  Result Value Ref Range   Sodium 132 (L) 135 - 145 mmol/L   Potassium 4.3 3.5 - 5.1 mmol/L   Chloride 99 98 - 111 mmol/L   CO2 24 22 - 32 mmol/L   Glucose, Bld 124 (H) 70 - 99 mg/dL    Comment: Glucose reference range applies only to samples taken after fasting for at least 8 hours.   BUN 22 8 - 23 mg/dL   Creatinine, Ser 6.21 0.44 - 1.00 mg/dL   Calcium  7.5 (L) 8.9 - 10.3 mg/dL   Total Protein 5.5 (L) 6.5 - 8.1 g/dL   Albumin  <1.5 (L) 3.5 - 5.0 g/dL   AST 54 (H) 15 - 41 U/L   ALT 65 (H) 0 - 44 U/L   Alkaline Phosphatase 245 (H) 38 - 126 U/L   Total Bilirubin 1.4 (H) 0.0 - 1.2 mg/dL   GFR, Estimated >30 >86 mL/min    Comment: (NOTE) Calculated using the CKD-EPI Creatinine Equation (2021)    Anion gap 9 5 - 15    Comment: Performed at [facility], [address], [place], [place] [address]   Magnesium      Status: None   Collection Time: 05/19/24  3:37 AM  Result Value Ref Range   Magnesium  2.1 1.7 - 2.4 mg/dL    Comment: Performed at [facility], [address], [place], [place] [address]  Phosphorus     Status: None   Collection Time: 05/19/24  3:37 AM  Result Value Ref Range   Phosphorus 3.5 2.5 - 4.6 mg/dL    Comment: Performed at [facility], [address] [place], [place] [address]  Triglycerides     Status: None   Collection Time: 05/19/24  3:40 AM  Result Value Ref Range   Triglycerides 120 <150 mg/dL    Comment: Performed at [facility], [address] [place], [place] [address]  Glucose, capillary     Status: Abnormal   Collection Time: 05/19/24  8:06 AM  Result Value Ref Range   Glucose-Capillary 144 (H) 70 - 99 mg/dL    Comment: Glucose reference range applies only to samples taken after fasting for at least 8 hours.  CBC     Status: Abnormal   Collection Time: 05/19/24  1:26 PM  Result Value Ref Range   WBC 13.8 (H) 4.0 - 10.5 K/uL   RBC 3.36 (L) 3.87 - 5.11 MIL/uL   Hemoglobin 9.7 (L) 12.0 - 15.0 g/dL   HCT 02.7 (L) 25.3 - 66.4 %  MCV 89.9 80.0 - 100.0 fL   MCH 28.9 26.0 - 34.0 pg   MCHC 32.1 30.0 - 36.0 g/dL   RDW 16.1 (H) 09.6 - 04.5 %   Platelets 491 (H) 150 - 400 K/uL   nRBC 0.7 (H) 0.0 - 0.2 %    Comment: Performed at [facility], [address], [place], [place] [address]  Prealbumin     Status: Abnormal   Collection Time: 05/19/24  1:26 PM  Result Value Ref Range   Prealbumin 10 (L) 18 - 38 mg/dL    Comment: Performed at [facility], [address], [place], [place] [address]  Glucose, capillary     Status: Abnormal   Collection Time: 05/19/24  4:07 PM  Result Value Ref Range   Glucose-Capillary 113 (H) 70 - 99 mg/dL    Comment: Glucose reference range applies only to samples taken after fasting for at least 8 hours.  Protime-INR     Status: None    Collection Time: 05/19/24  8:54 PM  Result Value Ref Range   Prothrombin Time 14.9 11.4 - 15.2 seconds   INR 1.2 0.8 - 1.2    Comment: (NOTE) INR goal varies based on device and disease states. Performed at [facility], [address], [place], [place] [address]   Glucose, capillary     Status: Abnormal   Collection Time: 05/19/24 11:56 PM  Result Value Ref Range   Glucose-Capillary 131 (H) 70 - 99 mg/dL    Comment: Glucose reference range applies only to samples taken after fasting for at least 8 hours.  CBC     Status: Abnormal   Collection Time: 05/20/24  3:50 AM  Result Value Ref Range   WBC 13.8 (H) 4.0 - 10.5 K/uL   RBC 3.31 (L) 3.87 - 5.11 MIL/uL   Hemoglobin 9.7 (L) 12.0 - 15.0 g/dL   HCT 21.3 (L) 08.6 - 57.8 %   MCV 92.4 80.0 - 100.0 fL   MCH 29.3 26.0 - 34.0 pg   MCHC 31.7 30.0 - 36.0 g/dL   RDW 46.9 (H) 62.9 - 52.8 %   Platelets 541 (H) 150 - 400 K/uL   nRBC 0.1 0.0 - 0.2 %    Comment: Performed at [facility], [address], [place], [place] [address]  Comprehensive metabolic panel     Status: Abnormal   Collection Time: 05/20/24  3:50 AM  Result Value Ref Range   Sodium 134 (L) 135 - 145 mmol/L   Potassium 4.1 3.5 - 5.1 mmol/L   Chloride 101 98 - 111 mmol/L   CO2 24 22 - 32 mmol/L   Glucose, Bld 129 (H) 70 - 99 mg/dL    Comment: Glucose reference range applies only to samples taken after fasting for at least 8 hours.   BUN 23 8 - 23 mg/dL   Creatinine, Ser 4.01 0.44 - 1.00 mg/dL   Calcium  7.7 (L) 8.9 - 10.3 mg/dL   Total Protein 5.8 (L) 6.5 - 8.1 g/dL   Albumin  <1.5 (L) 3.5 - 5.0 g/dL   AST 32 15 - 41 U/L   ALT 47 (H) 0 - 44 U/L   Alkaline Phosphatase 269 (H) 38 - 126 U/L   Total Bilirubin 1.2 0.0 - 1.2 mg/dL   GFR, Estimated >02 >72 mL/min    Comment: (NOTE) Calculated using the CKD-EPI Creatinine Equation (2021)    Anion gap 9 5 - 15    Comment:  Performed at [facility], [address] [place], [place] [address]    Imaging / Studies: CT ABDOMEN PELVIS W CONTRAST Result Date: 05/19/2024 CLINICAL DATA:  History of complicated diverticulitis with perforation status post colostomy takedown with abdominal distension EXAM: CT ABDOMEN AND PELVIS WITH CONTRAST TECHNIQUE: Multidetector CT imaging of the abdomen and pelvis was performed using the standard protocol following bolus administration of intravenous contrast. RADIATION DOSE REDUCTION: This exam was performed according to the departmental dose-optimization program which includes automated exposure control, adjustment of the mA and/or kV according to patient size and/or use of iterative reconstruction technique. CONTRAST:  OMNIPAQUE  IOHEXOL  300 MG/ML  SOLN COMPARISON:  CT abdomen and pelvis dated 10/19/2023 FINDINGS: Lower chest: Bilateral lower lobe relaxation atelectasis adjacent to small bilateral pleural effusions. No pneumothorax. Partially imaged heart size is normal. Hepatobiliary: No focal hepatic lesions. No intra or extrahepatic biliary ductal dilation. Gallbladder is contracted. 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 No main pancreatic ductal dilation. Spleen: Normal in size without focal abnormality. Adrenals/Urinary Tract: No adrenal nodules. No hydronephrosis or suspicious renal masses. Punctate nonobstructing left lower pole renal stone. Exophytic lower pole right renal cyst demonstrates peripheral calcification. No specific follow-up imaging recommended. Underdistended urinary bladder contains small volume intraluminal gas and catheter in-situ. Stomach/Bowel: Normal appearance of the stomach. Postsurgical changes of prior low anterior section with colostomy takedown and small bowel anastomosis. Frank dehiscence along the rectal anastomosis extending from 1-3 o'clock (2:76) with moderate volume free air and intraluminal contents extending  into the perirectal space and extending superiorly along the presacral space. Fluid and gas tracks to the anterior lower abdomen (2:73), where there is a moderately sized pocket of gas and fluid measuring 10.3 x 6.7 cm (2:72). This collection is also contiguous along the lateral right lower quadrant/pelvic sidewall (2:72). Multiple additional irregular fluid collections insinuate between loops of bowel, for example 3.9 x 2.0 cm right lower quadrant (2:64) and surrounding the descending/proximal sigmoid colon in the left lower quadrant measuring 5.0 x 2.2 cm (2:58). Vascular/Lymphatic: Aortic atherosclerosis. IVC filter in-situ. No enlarged abdominal or pelvic lymph nodes. Reproductive: No adnexal masses. Other: Free air and fluid collections as described. Musculoskeletal: No acute or abnormal lytic or blastic osseous lesions. Small foci of subcutaneous emphysema within the right anterior abdominal wall. Mild body wall edema. Postsurgical changes of the anterior abdominal wall. IMPRESSION: 1. Postsurgical changes of prior low anterior section with colostomy takedown and small bowel anastomosis. Frank dehiscence along the rectal anastomosis extending from 1-3 o'clock with moderate volume free air and intraluminal contents extending into the perirectal space and extending superiorly along the presacral space. Multiple irregular fluid collections as described. 2. Small bilateral pleural effusions with adjacent relaxation atelectasis. 3.  Aortic Atherosclerosis (ICD10-I70.0). Critical Value/emergent results were called by telephone at the time of interpretation on 05/19/2024 at 1:15 pm to provider [facility] , who verbally acknowledged these results. Electronically Signed   By: Limin  Xu M.D.   On: 05/19/2024 13:45     Medications / Allergies: per chart  Antibiotics: Anti-infectives (From admission, onward)    Start     Dose/Rate Route Frequency Ordered Stop   05/19/24 1400  piperacillin -tazobactam (ZOSYN ) IVPB  3.375 g        3.375 g 12.5 mL/hr over 240 Minutes Intravenous Every 8 hours 05/19/24 1303 05/24/24 1359   05/09/24 0900  erythromycin  250 mg in sodium chloride  0.9 % 100  mL IVPB        250 mg 100 mL/hr over 60 Minutes Intravenous Every 8 hours 05/09/24 0732 05/11/24 0044   05/08/24 2200  cefoTEtan  (CEFOTAN ) 2 g in sodium chloride  0.9 % 100 mL IVPB        2 g 200 mL/hr over 30 Minutes Intravenous Every 12 hours 05/08/24 1759 05/09/24 0749   05/08/24 1400  neomycin  (MYCIFRADIN ) tablet 1,000 mg  Status:  Discontinued       Placed in "And" Linked Group   1,000 mg Oral 3 times per day 05/08/24 1119 05/08/24 1120   05/08/24 1400  metroNIDAZOLE  (FLAGYL ) tablet 1,000 mg  Status:  Discontinued       Placed in "And" Linked Group   1,000 mg Oral 3 times per day 05/08/24 1119 05/08/24 1120   05/08/24 1130  cefoTEtan  (CEFOTAN ) 2 g in sodium chloride  0.9 % 100 mL IVPB        2 g 200 mL/hr over 30 Minutes Intravenous On call to O.R. 05/08/24 1119 05/09/24 0749         Note: Portions of this report may have been transcribed using voice recognition software. Every effort was made to ensure accuracy; however, inadvertent computerized transcription errors may be present.   Any transcriptional errors that result from this process are unintentional.    Eddye Goodie, MD, FACS, MASCRS Esophageal, Gastrointestinal & Colorectal Surgery Robotic and Minimally Invasive Surgery  [facility] [address] [place], [place] [address] [phone] Fax [phone] Main  CONTACT INFORMATION: Weekday (9AM-5PM): Call [facility] main office at [phone] Weeknight (5PM-9AM) or Weekend/Holiday: Check EPIC "Web Links" tab & use "AMION" (password " [email or web address]") for General Surgery [facility] coverage  Please, DO NOT use SecureChat  (it is not reliable communication to reach operating surgeons & will lead to a delay in care).   Epic staff messaging available  for outptient concerns needing 1-2 business day response.      05/20/2024  6:57 AM

## 2024-05-20 NOTE — Progress Notes (Signed)
 OT Cancellation Note  Patient Details Name: FIORELLA HANAHAN MRN: [id number] DOB: 11/13/1941   Cancelled Treatment:    Reason Eval/Treat Not Completed: Medical issues which prohibited therapy Patient is pending drain placements today. OT to continue to follow and check back as schedule will allow.  Wynette Heckler, MS Acute Rehabilitation Department Office# [phone]  05/20/2024, 9:43 AM

## 2024-05-20 NOTE — Sedation Documentation (Signed)
 Abdominal drain placed by Dr Burna Carrier with patient in supine position. Patient being prepped for transgluteal drain placement in prone position.

## 2024-05-20 NOTE — Consult Note (Signed)
 Chief Complaint: Abdominal pain, postop abdominal/pelvic fluid collections; referred  for image guided abdominal/pelvic fluid collection drainage(s)  Referring Provider(s): Gross,S  Supervising Physician: Babcock,G  Patient Status: [facility] - In-pt  History of Present Illness: Elizabeth Odom is an 83 y.o. female with past medical history of atrial fibrillation, anemia, cervical cancer, hypertension, fatty liver, nephrolithiasis, mild cognitive impairment, restless leg,PE/left lower extremity DVT, status post Hartman's resection and colostomy with small bowel resection for diverticular stricture/perforation/abscess on 10/01/2023.  She underwent right transgluteal approach pelvic drain placement by our team on 10/12/2023 and removed on 11/05/2023.  She is status post robotic rectosigmoid resection, takedown of end colostomy with anastomosis, resection of small intestine with anastomosis, small bowel repair, lysis of adhesions on 05/08/2024.  Follow-up CT abdomen pelvis yesterday revealed:  1. Postsurgical changes of prior low anterior section with colostomy takedown and small bowel anastomosis. Frank dehiscence along the rectal anastomosis extending from 1-3 o'clock with moderate volume free air and intraluminal contents extending into the perirectal space and extending superiorly along the presacral space. Multiple irregular fluid collections as described. 2. Small bilateral pleural effusions with adjacent relaxation atelectasis. 3.  Aortic Atherosclerosis   Current temp 99.3, WBC 13.8, hemoglobin 9.7, platelets 541k, creat nl, PT/INR normal; patient on TPN and Zosyn .  Request now received from surgical team for image guided drainage of abdominal/pelvic fluid collections.   Patient is Full Code  Past Medical History:  Diagnosis Date   A-fib (HCC) 04/09/2016   Acute head injury 04/09/2016   Anemia    Blood dyscrasia    Hx DVT / PE   Cervical cancer (HCC)    Cervical spine fracture,  initial encounter 04/09/2016   Claustrophobia    extremely claustrophobic   Dupuytren disease of palm of both hands    Essential hypertension    Fatty liver    History of kidney stones    MCI (mild cognitive impairment) with memory loss    Multiple fractures of cervical spine (HCC) 04/09/2016   Restless leg    Syncope and collapse 04/09/2016    Past Surgical History:  Procedure Laterality Date   APPENDECTOMY     BIOPSY  09/29/2023   Procedure: BIOPSY;  Surgeon: Genell Ken, MD;  Location: [facility];  Service: Gastroenterology;;   breast lump removal Left    CERVICAL LAMINECTOMY     COLECTOMY WITH COLOSTOMY CREATION/HARTMANN PROCEDURE N/A 10/01/2023   Procedure: COLECTOMY WITH COLOSTOMY CREATION/HARTMANN PROCEDURE;  Surgeon: Shela Derby, MD;  Location: [facility];  Service: General;  Laterality: N/A;   complete hysterectomy     CYSTOSCOPY WITH INDOCYANINE GREEN  IMAGING (ICG) N/A 05/08/2024   Procedure: CYSTOSCOPY WITH INDOCYANINE GREEN  IMAGING (ICG);  Surgeon: Thelbert Finner, MD;  Location: [facility];  Service: Urology;  Laterality: N/A;   EP IMPLANTABLE DEVICE N/A 04/10/2016   Procedure: Loop Recorder Insertion;  Surgeon: Tammie Fall, MD;  Location: [facility];  Service: Cardiovascular;  Laterality: N/A;   FLEXIBLE SIGMOIDOSCOPY N/A 09/29/2023   Procedure: FLEXIBLE SIGMOIDOSCOPY;  Surgeon: Genell Ken, MD;  Location: [facility];  Service: Gastroenterology;  Laterality: N/A;   FLEXIBLE SIGMOIDOSCOPY N/A 05/08/2024   Procedure: SIGMOIDOSCOPY, FLEXIBLE;  Surgeon: Candyce Champagne, MD;  Location: [facility];  Service: General;  Laterality: N/A;   HARVEST BONE GRAFT     IR IVC FILTER PLMT / S&I /IMG GUID/MOD SED  10/05/2023   IR SINUS/FIST TUBE CHK-NON GI  10/22/2023   IR SINUS/FIST TUBE CHK-NON GI  11/05/2023   LAPAROSCOPIC  LYSIS OF ADHESIONS N/A 05/08/2024   Procedure: LYSIS, ADHESIONS, LAPAROSCOPIC;  Surgeon: Candyce Champagne, MD;  Location: [facility];  Service: General;   Laterality: N/A;  LYSIS OF ADHESIONS   LOOP RECORDER REMOVAL N/A 07/08/2018   Procedure: LOOP RECORDER REMOVAL;  Surgeon: Tammie Fall, MD;  Location: [facility];  Service: Cardiovascular;  Laterality: N/A;   XI ROBOTIC ASSISTED COLOSTOMY TAKEDOWN N/A 05/08/2024   Procedure: CLOSURE, COLOSTOMY, ROBOT-ASSISTED -ROBOTIC RECTOSIGMOID RESECTION (LAR) -TAKEDOWN OF END COLOSTOMY WITH ANASTOMOSIS -RESECTION OF SMALL INTESTINE WITH ANASTOMOSIS -SMALL BOWEL REPAIR -LYSIS OF ADHESIONS x 115 MINUTES (66% OF CASE),  -INTRAOPERATIVE ASSESSMENT OF TISSUE VASCULAR PERFUSION USING ICG (indocyanine green ) IMMUNOFLUORESCENCE,  -TRANSVERSUS ABDOMINIS PLANE (TAP) BLOCK - BILATE    Allergies: Elemental sulfur  Medications: Prior to Admission medications   Medication Sig Start Date End Date Taking? Authorizing Provider  amLODipine  (NORVASC ) 5 MG tablet Take 1 tablet (5 mg total) by mouth daily. Patient taking differently: Take 5 mg by mouth at bedtime. 04/08/24  Yes Patwardhan, Manish J, MD  ascorbic acid  (VITAMIN C ) 250 MG CHEW Chew 250 mg by mouth daily.   Yes [provider]  aspirin  EC 81 MG tablet Take 81 mg by mouth daily. Swallow whole.   Yes [provider]  cyanocobalamin  (VITAMIN B12) 1000 MCG tablet Take 1,000 mcg by mouth daily.   Yes [provider]  FIBER GUMMIES PO Take 1 capsule by mouth daily.   Yes [provider]  Multiple Vitamin (MULTIVITAMIN WITH MINERALS) TABS tablet Take 1 tablet by mouth daily. 11/10/23  Yes Kraig Peru, MD  Omega-3 Fatty Acids (OMEGA 3 500 PO) Take 500 mg by mouth daily.   Yes [provider]  OVER THE COUNTER MEDICATION Take 650 mg by mouth daily. Total Beets supplement   Yes [provider]  traMADol  (ULTRAM ) 50 MG tablet Take 1-2 tablets (50-100 mg total) by mouth every 6 (six) hours as needed for moderate pain (pain score 4-6) or severe pain (pain score 7-10). 05/08/24  Yes Candyce Champagne, MD     Family  History  Problem Relation Age of Onset   Other Mother        thrombosis   Cancer Sister    Diabetes Sister    Stroke Daughter    Thyroid  disease Daughter     Social History   Socioeconomic History   Marital status: Widowed    Spouse name: Not on file   Number of children: 3   Years of education: schooling in [place]   Highest education level: Not on file  Occupational History   Not on file  Tobacco Use   Smoking status: Never   Smokeless tobacco: Never  Vaping Use   Vaping status: Never Used  Substance and Sexual Activity   Alcohol  use: Never    Alcohol /week: 0.0 standard drinks of alcohol    Drug use: Never   Sexual activity: Not Currently  Other Topics Concern   Not on file  Social History Narrative   Patient is originally from [place], [place].   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    Her emergency contact/medical decision maker is her granddaughter:  Genevive Ket [phone]).   Social Drivers of Corporate investment banker Strain: Not on file  Food Insecurity: No Food Insecurity (05/09/2024)   Hunger Vital Sign    Worried About Running Out of Food in the Last Year: Never  true    Ran Out of Food in the Last Year: Never true  Transportation Needs: No Transportation Needs (05/09/2024)   PRAPARE - Administrator, Civil Service (Medical): No    Lack of Transportation (Non-Medical): No  Physical Activity: Not on file  Stress: Not on file  Social Connections: Unknown (05/09/2024)   Social Connection and Isolation Panel [NHANES]    Frequency of Communication with Friends and Family: Three times a week    Frequency of Social Gatherings with Friends and Family: Once a week    Attends Religious Services: 1 to 4 times per year    Active Member of [employer] or Organizations: Yes    Attends Banker Meetings: 1 to 4 times per year    Marital Status: Patient declined        Review of Systems currently without high fever, headache, chest pain, worsening dyspnea, nausea, vomiting or bleeding.  She does have abdominal/back pain, weakness  Vital Signs: BP (!) 110/57 (BP Location: Left Arm)   Pulse (!) 108   Temp 99.3 F (37.4 C) (Oral)   Resp 18   Ht 5\' 5"  (1.651 m)   Wt 134 lb 14.7 oz (61.2 kg)   SpO2 95%   BMI 22.45 kg/m   Advance Care Plan: No documents on file.  Physical Exam patient awake, answers simple questions okay.  Chest with slightly diminished breath sounds bases.  Heart with slightly tachycardic rate, ectopy noted.  Abdomen distended, few bowel sounds, some mid abdominal tenderness to palpation; no significant lower extremity edema; speech slightly garbled  Imaging: CT ABDOMEN PELVIS W CONTRAST Result Date: 05/19/2024 CLINICAL DATA:  History of complicated diverticulitis with perforation status post colostomy takedown with abdominal distension EXAM: CT ABDOMEN AND PELVIS WITH CONTRAST TECHNIQUE: Multidetector CT imaging of the abdomen and pelvis was performed using the standard protocol following bolus administration of intravenous contrast. RADIATION DOSE REDUCTION: This exam was performed according to the departmental dose-optimization program which includes automated exposure control, adjustment of the mA and/or kV according to patient size and/or use of iterative reconstruction technique. CONTRAST:  OMNIPAQUE  IOHEXOL  300 MG/ML  SOLN COMPARISON:  CT abdomen and pelvis dated 10/19/2023 FINDINGS: Lower chest: Bilateral lower lobe relaxation atelectasis adjacent to small bilateral pleural effusions. No pneumothorax. Partially imaged heart size is normal. Hepatobiliary: No focal hepatic lesions. No intra or extrahepatic biliary ductal dilation. Gallbladder is contracted. 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 No main pancreatic ductal dilation. Spleen:  Normal in size without focal abnormality. Adrenals/Urinary Tract: No adrenal nodules. No hydronephrosis or suspicious renal masses. Punctate nonobstructing left lower pole renal stone. Exophytic lower pole right renal cyst demonstrates peripheral calcification. No specific follow-up imaging recommended. Underdistended urinary bladder contains small volume intraluminal gas and catheter in-situ. Stomach/Bowel: Normal appearance of the stomach. Postsurgical changes of prior low anterior section with colostomy takedown and small bowel anastomosis. Frank dehiscence along the rectal anastomosis extending from 1-3 o'clock (2:76) with moderate volume free air and intraluminal contents extending into the perirectal space and extending superiorly along the presacral space. Fluid and gas tracks to the anterior lower abdomen (2:73), where there is a moderately sized pocket of gas and fluid measuring 10.3 x 6.7 cm (2:72). This collection is also contiguous along the lateral right lower quadrant/pelvic sidewall (2:72). Multiple additional irregular fluid collections insinuate between loops of bowel, for example 3.9 x 2.0 cm right lower quadrant (2:64) and  surrounding the descending/proximal sigmoid colon in the left lower quadrant measuring 5.0 x 2.2 cm (2:58). Vascular/Lymphatic: Aortic atherosclerosis. IVC filter in-situ. No enlarged abdominal or pelvic lymph nodes. Reproductive: No adnexal masses. Other: Free air and fluid collections as described. Musculoskeletal: No acute or abnormal lytic or blastic osseous lesions. Small foci of subcutaneous emphysema within the right anterior abdominal wall. Mild body wall edema. Postsurgical changes of the anterior abdominal wall. IMPRESSION: 1. Postsurgical changes of prior low anterior section with colostomy takedown and small bowel anastomosis. Frank dehiscence along the rectal anastomosis extending from 1-3 o'clock with moderate volume free air and intraluminal contents extending  into the perirectal space and extending superiorly along the presacral space. Multiple irregular fluid collections as described. 2. Small bilateral pleural effusions with adjacent relaxation atelectasis. 3.  Aortic Atherosclerosis (ICD10-I70.0). Critical Value/emergent results were called by telephone at the time of interpretation on 05/19/2024 at 1:15 pm to provider [facility] , who verbally acknowledged these results. Electronically Signed   By: Limin  Xu M.D.   On: 05/19/2024 13:45   DG CHEST PORT 1 VIEW Result Date: 05/12/2024 CLINICAL DATA:  [id number] S/P PICC central line placement [id number] EXAM: PORTABLE CHEST - 1 VIEW COMPARISON:  May 11, 2024 FINDINGS: Right PICC terminates lower SVC. Esophagogastric tube extends below the level of the diaphragms with the distal tip outside the field of view. However, the last side hole appears in the region of the stomach. No focal airspace consolidation, pleural effusion, or pneumothorax. No cardiomegaly. IMPRESSION: 1. Right PICC terminates lower SVC. 2. Esophagogastric tube extends below the level of the diaphragm with the distal tip outside the field of view. However, the last sidehole appears in the region of the stomach. Electronically Signed   By: Rance Burrows M.D.   On: 05/12/2024 11:59   DG Abd 1 View Result Date: 05/12/2024 CLINICAL DATA:  NG tube placement EXAM: ABDOMEN - 1 VIEW COMPARISON:  October 26, 2023 FINDINGS: the tip of the nasogastric tube is located within the antrum stomach in good position. IVC filter in place IMPRESSION: Nasogastric tube in good position. Electronically Signed   By: Fredrich Jefferson M.D.   On: 05/12/2024 08:53   US  EKG SITE RITE Result Date: 05/12/2024 If Site Rite image not attached, placement could not be confirmed due to current cardiac rhythm.  DG CHEST PORT 1 VIEW Result Date: 05/11/2024 EXAM: 1 VIEW XRAY OF THE CHEST 05/11/2024 06:11:00 PM COMPARISON: 05/09/2024 CLINICAL HISTORY: Dyspnea, had bleeding from rectum earlier  today and became unresponsive. FINDINGS: LUNGS AND PLEURA: No focal pulmonary opacity. No pulmonary edema. No pleural effusion. No pneumothorax. HEART AND MEDIASTINUM: No acute abnormality of the cardiac and mediastinal silhouettes. BONES AND SOFT TISSUES: No acute osseous abnormality. IMPRESSION: 1. No acute process. Electronically signed by: Zadie Herter MD 05/11/2024 07:06 PM EDT RP Workstation: [id number]   ECHOCARDIOGRAM COMPLETE Result Date: 05/10/2024    ECHOCARDIOGRAM REPORT   Patient Name:   SUA SPADAFORA Date of Exam: 05/10/2024 Medical Rec #:  [id number]      Height:       65.0 in Accession #:    [id number]     Weight:       132.9 lb Date of Birth:  1941/10/27     BSA:          1.663 m Patient Age:    82 years       BP:           158/72 mmHg Patient Gender: F  HR:           95 bpm. Exam Location:  Inpatient Procedure: 2D Echo, Cardiac Doppler and Color Doppler (Both Spectral and Color            Flow Doppler were utilized during procedure). Indications:    I48.91* Unspeicified atrial fibrillation  History:        Patient has prior history of Echocardiogram examinations, most                 recent 10/01/2023. Abnormal ECG, Arrythmias:Atrial Fibrillation,                 Signs/Symptoms:Altered Mental Status; Risk Factors:Hypertension                 and Dyslipidemia. Polmonary embolus. IVC filter.  Sonographer:    Raynelle Callow RDCS Referring Phys: [phone] ANAND D HONGALGI IMPRESSIONS  1. Left ventricular ejection fraction, by estimation, is 60 to 65%. The left ventricle has normal function. The left ventricle has no regional wall motion abnormalities. Left ventricular diastolic parameters are indeterminate.  2. Right ventricular systolic function is normal. The right ventricular size is normal. There is normal pulmonary artery systolic pressure.  3. The mitral valve was not well visualized. Mild mitral valve regurgitation. No evidence of mitral stenosis.  4. The tricuspid valve is abnormal.  Tricuspid valve regurgitation is mild to moderate.  5. The aortic valve is tricuspid. Aortic valve regurgitation is not visualized. No aortic stenosis is present.  6. The inferior vena cava is normal in size with greater than 50% respiratory variability, suggesting right atrial pressure of 3 mmHg. FINDINGS  Left Ventricle: Left ventricular ejection fraction, by estimation, is 60 to 65%. The left ventricle has normal function. The left ventricle has no regional wall motion abnormalities. The left ventricular internal cavity size was normal in size. There is  no left ventricular hypertrophy. Left ventricular diastolic parameters are indeterminate. Right Ventricle: The right ventricular size is normal. No increase in right ventricular wall thickness. Right ventricular systolic function is normal. There is normal pulmonary artery systolic pressure. The tricuspid regurgitant velocity is 2.58 m/s, and  with an assumed right atrial pressure of 8 mmHg, the estimated right ventricular systolic pressure is 34.6 mmHg. Left Atrium: Left atrial size was normal in size. Right Atrium: Right atrial size was normal in size. Pericardium: There is no evidence of pericardial effusion. Mitral Valve: The mitral valve was not well visualized. Mild mitral valve regurgitation. No evidence of mitral valve stenosis. Tricuspid Valve: The tricuspid valve is abnormal. Tricuspid valve regurgitation is mild to moderate. No evidence of tricuspid stenosis. Aortic Valve: The aortic valve is tricuspid. Aortic valve regurgitation is not visualized. No aortic stenosis is present. Aortic valve mean gradient measures 5.1 mmHg. Aortic valve peak gradient measures 11.4 mmHg. Aortic valve area, by VTI measures 2.37  cm. Pulmonic Valve: The pulmonic valve was normal in structure. Pulmonic valve regurgitation is not visualized. No evidence of pulmonic stenosis. Aorta: The aortic root and ascending aorta are structurally normal, with no evidence of dilitation.  Venous: The inferior vena cava is normal in size with greater than 50% respiratory variability, suggesting right atrial pressure of 3 mmHg. IAS/Shunts: No atrial level shunt detected by color flow Doppler.  LEFT VENTRICLE PLAX 2D LVIDd:         3.60 cm LVIDs:         2.40 cm LV PW:         1.00 cm LV IVS:  1.00 cm LVOT diam:     2.30 cm LV SV:         57 LV SV Index:   34 LVOT Area:     4.15 cm  LV Volumes (MOD) LV vol d, MOD A2C: 61.0 ml LV vol d, MOD A4C: 49.1 ml LV vol s, MOD A2C: 27.6 ml LV vol s, MOD A4C: 21.0 ml LV SV MOD A2C:     33.4 ml LV SV MOD A4C:     49.1 ml LV SV MOD BP:      33.9 ml RIGHT VENTRICLE             IVC RV S prime:     18.00 cm/s  IVC diam: 2.10 cm TAPSE (M-mode): 1.3 cm LEFT ATRIUM           Index        RIGHT ATRIUM           Index LA diam:      2.00 cm 1.20 cm/m   RA Area:     14.60 cm LA Vol (A2C): 9.6 ml  5.79 ml/m   RA Volume:   30.80 ml  18.52 ml/m LA Vol (A4C): 17.6 ml 10.58 ml/m  AORTIC VALVE AV Area (Vmax):    2.58 cm AV Area (Vmean):   2.82 cm AV Area (VTI):     2.37 cm AV Vmax:           168.92 cm/s AV Vmean:          103.287 cm/s AV VTI:            0.240 m AV Peak Grad:      11.4 mmHg AV Mean Grad:      5.1 mmHg LVOT Vmax:         105.00 cm/s LVOT Vmean:        70.100 cm/s LVOT VTI:          0.137 m LVOT/AV VTI ratio: 0.57  AORTA Ao Root diam: 3.20 cm Ao Asc diam:  3.50 cm MITRAL VALVE               TRICUSPID VALVE MV Area (PHT): 3.65 cm    TR Peak grad:   26.6 mmHg MV Decel Time: 208 msec    TR Vmax:        258.00 cm/s MV E velocity: 85.20 cm/s                            SHUNTS                            Systemic VTI:  0.14 m                            Systemic Diam: 2.30 cm Armida Lander MD Electronically signed by Armida Lander MD Signature Date/Time: 05/10/2024/6:42:49 PM    Final    DG Chest Port 1 View Result Date: 05/09/2024 CLINICAL DATA:  AFib postop colon surgery EXAM: PORTABLE CHEST 1 VIEW COMPARISON:  09/20/2023 FINDINGS: No acute airspace  disease or effusion. Cardiomediastinal silhouette within normal limits. Aortic atherosclerosis. Chest wall emphysema bilaterally. Small volume free air beneath the right diaphragm. IVC filter to the right of L3. IMPRESSION: 1. No active disease. 2. Small volume free air beneath the right diaphragm and bilateral chest wall emphysema, likely postsurgical. It  Electronically Signed   By: Esmeralda Hedge M.D.   On: 05/09/2024 19:41   DG C-Arm 1-60 Min-No Report Result Date: 05/08/2024 Fluoroscopy was utilized by the requesting physician.  No radiographic interpretation.    Labs:  CBC: Recent Labs    05/18/24 0318 05/18/24 1831 05/19/24 1326 05/20/24 0350  WBC 17.9* 14.8* 13.8* 13.8*  HGB 9.8* 10.0* 9.7* 9.7*  HCT 29.5* 30.2* 30.2* 30.6*  PLT 430* 473* 491* 541*    COAGS: Recent Labs    10/05/23 0247 10/11/23 1255 10/12/23 0655 05/19/24 2054  INR 1.1 1.1 1.1 1.2  APTT 30  --   --   --     BMP: Recent Labs    05/17/24 0008 05/18/24 0318 05/19/24 0337 05/20/24 0350  NA 133* 134* 132* 134*  K 3.8 4.0 4.3 4.1  CL 98 101 99 101  CO2 25 24 24 24   GLUCOSE 134* 150* 124* 129*  BUN 46* 25* 22 23  CALCIUM  7.3* 7.6* 7.5* 7.7*  CREATININE 0.99 0.59 0.63 0.65  GFRNONAA 57* >60 >60 >60    LIVER FUNCTION TESTS: Recent Labs    05/13/24 0521 05/15/24 0339 05/19/24 0337 05/20/24 0350  BILITOT 0.7 1.8* 1.4* 1.2  AST 33 51* 54* 32  ALT 25 55* 65* 47*  ALKPHOS 56 78 245* 269*  PROT 5.6* 5.3* 5.5* 5.8*  ALBUMIN  2.1* 1.6* <1.5* <1.5*    TUMOR MARKERS: No results for input(s): "AFPTM", "CEA", "CA199", "CHROMGRNA" in the last 8760 hours.  Assessment and Plan: 83 y.o. female with past medical history of atrial fibrillation, anemia, cervical cancer, hypertension, fatty liver, nephrolithiasis, mild cognitive impairment, restless leg,PE/left lower extremity DVT, status post Hartman's resection and colostomy with small bowel resection for diverticular stricture/perforation/abscess on  10/01/2023.  She underwent right transgluteal approach pelvic drain placement by our team on 10/12/2023 and removed on 11/05/2023.  She is status post robotic rectosigmoid resection, takedown of end colostomy with anastomosis, resection of small intestine with anastomosis, small bowel repair, lysis of adhesions on 05/08/2024.  Follow-up CT abdomen pelvis yesterday revealed:  1. Postsurgical changes of prior low anterior section with colostomy takedown and small bowel anastomosis. Frank dehiscence along the rectal anastomosis extending from 1-3 o'clock with moderate volume free air and intraluminal contents extending into the perirectal space and extending superiorly along the presacral space. Multiple irregular fluid collections as described. 2. Small bilateral pleural effusions with adjacent relaxation atelectasis. 3.  Aortic Atherosclerosis   Current temp 99.3, WBC 13.8, hemoglobin 9.7, platelets 541k, creat nl, PT/INR normal; patient on TPN and Zosyn .  Request now received from surgical team for image guided drainage of abdominal/pelvic fluid collections.  Latest imaging studies have been reviewed by Dr. Burna Carrier.Risks and benefits discussed with the patient/power of attorney Genevive Ket including bleeding, infection, damage to adjacent structures, bowel perforation/fistula connection, and sepsis.  All of the patient's questions were answered, patient is agreeable to proceed. Consent signed and in chart.  Procedure scheduled for this afternoon  Thank you for allowing our service to participate in Symphani M Recore 's care.  Electronically Signed: D. Honore Lux, PA-C   05/20/2024, 11:55 AM      I spent a total of    40 Minutes in face to face in clinical consultation, greater than 50% of which was counseling/coordinating care for image guided drainage of abdominal/pelvic fluid collections

## 2024-05-20 NOTE — Progress Notes (Signed)
 PHARMACY - TOTAL PARENTERAL NUTRITION CONSULT NOTE   Indication: Prolonged ileus  Patient Measurements: Height: 5\' 5"  (165.1 cm) Weight: 61.2 kg (134 lb 14.7 oz) IBW/kg (Calculated) : 57 TPN AdjBW (KG): 61.3 Body mass index is 22.45 kg/m.  Assessment: 67 yoF with history of diverticulitis with perforation and abscess. 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 She requested ostomy takedown and underwent LOA, colostomy takedown, small bowel resection with anastomosis on 5/29. Pharmacy is consulted to dose TPN starting 6/2 for postop ileus.  Glucose / Insulin : no Hx DM, A1c 5.1% -CBG at goal < 150 (3 units SSI/24 hrs) Electrolytes: Na (134) remains slightly low/improved on max concentration of Na in TPN.  All Others WNL. - Goal K>4, Mg>2, Phos>3 Renal: SCr stable ~0.6; BUN WNL Hepatic: ALT slightly elevated, alb low <1.5, Alkphos up to 269 I/O: no mIVF -UOP: 2350 mL, Stool x13 -PO intake: 120 mL, RN reports eating only minimally  GI Imaging:  - 6/9 CT: Elizabeth Odom dehiscence along the rectal anastomosis, free air and multiple fluid collections. GI Surgeries / Procedures: -5/29: LAR, SBR, colostomy takedown, LOA - 6/10 IR consult for drain placement  Central access: Double lumen PICC placed 6/2 TPN start date: 6/2  Nutritional Goals: -Goal TPN rate is 70 mL/hr (provides 84 g of protein and 1720 kcals per day)  RD Assessment:  Estimated Needs Total Energy Estimated Needs: 1600-1750 kcals Total Protein Estimated Needs: 80-95 grams Total Fluid Estimated Needs: >/= 1.6L  Current Nutrition:  Magic cup TID per RD.  Pt refusing all Boost Plus supplements. Calorie count ordered, but now changed to NPO pending IR.    Plan:  At 18:00 Continue TPN at goal rate of 70 mL/hr Electrolytes in TPN: No change Na 150 mEq/L (max), K 50 mEq/L, Ca 5 mEq/L, Mg 5 mEq/L, Phos 15  mmol/L.  Cl:Ac 1:1 Continue moderate SSI q8h Add standard MVI + trace to TPN Monitor TPN labs on Mon/Thurs.  Kendall Pauls PharmD, BCPS [facility] main pharmacy [phone] 05/20/2024 8:44 AM

## 2024-05-20 NOTE — Procedures (Signed)
 Pre procedural Dx: Post op abscess, multiple Post procedural Dx: Same  Technically successful CT guided placed of a 10 Fr drainage catheter placement into the midline ventral and dorsal perirectal abscesses.    A representative aspirated sample was capped and sent to the laboratory for analysis.    EBL: Trace Complications: None immediate  Elizabeth Hillock, MD Pager #: [phone]

## 2024-05-20 NOTE — Progress Notes (Signed)
 PT Cancellation Note  Patient Details Name: Elizabeth Odom MRN: [id number] DOB: 11-16-1941   Cancelled Treatment:     Pt scheduled for drain placement today.  Rehab Team to continue to monitor and attempt to see another day.    Bess Broody  PTA Acute  Rehabilitation Services Office M-F          [phone]

## 2024-05-20 NOTE — Progress Notes (Signed)
 [facility]  PROGRESS NOTE  Elizabeth Odom XBM:[id number] DOB: 1941/05/14 DOA: 05/08/2024 PCP: Anthon Kins, MD   Brief HPI:    83 year old female, lives independently, medical history significant for remote history of A-fib evaluated with a loop recorder which was negative, at some point was on anticoagulation, had some bleeding and this was discontinued, anemia, PACs, HTN, mild cognitive impairment, syncope, large bowel obstruction prompting colon resection, creation 09/2020 and at that hospitalization found to have left leg DVT, small PE when given recent surgery with inability to use anticoagulation, had IVC filter placed that was subsequently removed, underwent elective robotic rectosigmoid resection, colostomy takedown with bowel anastomosis, extensive lysis of additions on 5/29 after preop cardiac clearance.  On 5/30 even in, patient developed AMS after polypharmacy along with A-fib with RVR and TRH was consulted.  Patient was transferred to stepdown unit.  Course complicated by A-fib with RVR, cardiology consulting, on IV beta-blockers.  Also hypotension, rectal bleed and syncope x 1 on 6/1, prolonged postop ileus, currently with NG tube, PICC line and TPN.    Assessment/Plan:   Postop A-fib with RVR/PVCs Remote history of A-fib not on rate control meds or anticoagulation preop Seen by Dr. Filiberto Hug in preop clearance on 04/08/2024 Likely stress response due to recent surgery, multiple meds, postop acute blood loss anemia. TTE with preserved LVEF.  TSH normal. Cardiology following, started on Toprol  XL 25 mg at bedtime Started on apixaban  for anticoagulation; currently on hold due to recurrent GI bleed -Cardiology has signed off, recommend to restart Eliquis  when okay with surgery   Acute blood loss anemia - S/p 4 units of blood transfusion - Hemoglobin stable at 9.7 - Eliquis  currently on hold as above   Acute kidney injury Resolved, likely in setting of hypotension    Syncope x 1 On 6/1 when patient was on bedside commode Suspected due to hypotension or vasovagal If patient drives, she must be counseled not to drive x 6 months. Echo with preserved LVEF Telemetry with A-fib with controlled ventricular rate.   Postop ileus Management per primary service -Started on TPN    Urinary retention -Status post Foley placement on 6/4 -Plan for voiding trial on 6/7; if fails then Foley will be replaced and patient can follow-up with urology .   Hypophosphatemia Replaced by pharmacy in TPN and monitor.   Leukocytosis May be stress response.  No clinical concern for infection apart from recent abdominal surgery-defer antibiotics for that to primary service. Completed IV azithromycin for GI dysmotility No clinical UTI.  Urine microscopy without concern for UTI.  Chest x-ray without pneumonia Fluctuating but overall stable   S/p colostomy takedown Management per primary service.     Mild delirium -resolved Ongoing on and off, multifactorial due to acute illness, recent surgery, meds, ICU/hospital delirium Delirium precautions Minimize opioids and sedatives.    Medications     acetaminophen   500 mg Oral TID WC & HS   ascorbic acid   250 mg Oral Daily   Chlorhexidine  Gluconate Cloth  6 each Topical Daily   cyanocobalamin   1,000 mcg Oral Daily   insulin  aspart  0-15 Units Subcutaneous Q8H   lactose free nutrition  237 mL Oral TID WC   liver oil-zinc  oxide   Topical BID   metoprolol  succinate  25 mg Oral Daily   metoprolol  tartrate  12.5 mg Oral BID   multivitamin with minerals  1 tablet Oral Daily   pantoprazole   40 mg Oral Daily   polycarbophil  625 mg Oral Daily   sodium chloride  flush  10-40 mL Intracatheter Q12H   sodium chloride  flush  3 mL Intravenous Q12H   tamsulosin  0.4 mg Oral QPC supper     Data Reviewed:   CBG:  Recent Labs  Lab 05/18/24 2350 05/19/24 0806 05/19/24 1607 05/19/24 2356 05/20/24 0749  GLUCAP 127* 144*  113* 131* 131*    SpO2: 95 % O2 Flow Rate (L/min): 6 L/min    Vitals:   05/19/24 1804 05/19/24 2110 05/20/24 0500 05/20/24 0536  BP:  108/61  (!) 110/57  Pulse:  79  (!) 108  Resp:  18  18  Temp: 99.5 F (37.5 C) 98.9 F (37.2 C)  99.3 F (37.4 C)  TempSrc: Oral Oral  Oral  SpO2:  94%  95%  Weight:   61.2 kg   Height:          Data Reviewed:  Basic Metabolic Panel: Recent Labs  Lab 05/14/24 0325 05/15/24 0339 05/16/24 0601 05/17/24 0008 05/18/24 0318 05/19/24 0337 05/20/24 0350  NA 135 132* 132* 133* 134* 132* 134*  K 3.5 3.4* 4.0 3.8 4.0 4.3 4.1  CL 100 98 98 98 101 99 101  CO2 25 25 23 25 24 24 24   GLUCOSE 142* 128* 160* 134* 150* 124* 129*  BUN 34* 28* 36* 46* 25* 22 23  CREATININE 0.78 0.71 0.88 0.99 0.59 0.63 0.65  CALCIUM  8.2* 7.9* 7.8* 7.3* 7.6* 7.5* 7.7*  MG 2.0 1.9  --  2.0 2.0 2.1  --   PHOS 2.6 3.0  --  3.4 2.5 3.5  --     CBC: Recent Labs  Lab 05/17/24 1229 05/18/24 0318 05/18/24 1831 05/19/24 1326 05/20/24 0350  WBC 21.7* 17.9* 14.8* 13.8* 13.8*  HGB 6.9* 9.8* 10.0* 9.7* 9.7*  HCT 20.9* 29.5* 30.2* 30.2* 30.6*  MCV 88.6 88.3 88.3 89.9 92.4  PLT 428* 430* 473* 491* 541*    LFT Recent Labs  Lab 05/15/24 0339 05/19/24 0337 05/20/24 0350  AST 51* 54* 32  ALT 55* 65* 47*  ALKPHOS 78 245* 269*  BILITOT 1.8* 1.4* 1.2  PROT 5.3* 5.5* 5.8*  ALBUMIN  1.6* <1.5* <1.5*     Antibiotics: Anti-infectives (From admission, onward)    Start     Dose/Rate Route Frequency Ordered Stop   05/19/24 1400  piperacillin -tazobactam (ZOSYN ) IVPB 3.375 g        3.375 g 12.5 mL/hr over 240 Minutes Intravenous Every 8 hours 05/19/24 1303 05/24/24 1359   05/09/24 0900  erythromycin  250 mg in sodium chloride  0.9 % 100 mL IVPB        250 mg 100 mL/hr over 60 Minutes Intravenous Every 8 hours 05/09/24 0732 05/11/24 0044   05/08/24 2200  cefoTEtan  (CEFOTAN ) 2 g in sodium chloride  0.9 % 100 mL IVPB        2 g 200 mL/hr over 30 Minutes Intravenous Every  12 hours 05/08/24 1759 05/09/24 0749   05/08/24 1400  neomycin  (MYCIFRADIN ) tablet 1,000 mg  Status:  Discontinued       Placed in "And" Linked Group   1,000 mg Oral 3 times per day 05/08/24 1119 05/08/24 1120   05/08/24 1400  metroNIDAZOLE  (FLAGYL ) tablet 1,000 mg  Status:  Discontinued       Placed in "And" Linked Group   1,000 mg Oral 3 times per day 05/08/24 1119 05/08/24 1120   05/08/24 1130  cefoTEtan  (CEFOTAN ) 2 g in sodium chloride  0.9 % 100 mL IVPB  2 g 200 mL/hr over 30 Minutes Intravenous On call to O.R. 05/08/24 1119 05/09/24 0749        Subjective   Still complains of abdominal pain.   Objective    Physical Examination:  Appears in no acute distress Abdomen is soft, mild tenderness to palpation, distended Lungs are clear to auscultation bilaterally Heart irregularly irregular rhythm   Status is: Inpatient:      Pressure Injury 10/08/23 Buttocks Right;Mid Stage 2 -  Partial thickness loss of dermis presenting as a shallow open injury with a red, pink wound bed without slough. (Active)  10/08/23 0800  Location: Buttocks  Location Orientation: Right;Mid  Staging: Stage 2 -  Partial thickness loss of dermis presenting as a shallow open injury with a red, pink wound bed without slough.  Wound Description (Comments):   Present on Admission:         Willette Mudry S Rodrecus Belsky   [facility] If 7PM-7AM, please contact night-coverage at [email or web address], Office  [phone]   05/20/2024, 8:27 AM  LOS: 12 days

## 2024-05-20 NOTE — Sedation Documentation (Addendum)
 Transgluteal drain placement procedure begun.

## 2024-05-21 DIAGNOSIS — K5790 Diverticulosis of intestine, part unspecified, without perforation or abscess without bleeding: Secondary | ICD-10-CM | POA: Diagnosis not present

## 2024-05-21 LAB — CBC
HCT: 31.4 % — ABNORMAL LOW (ref 36.0–46.0)
Hemoglobin: 9.7 g/dL — ABNORMAL LOW (ref 12.0–15.0)
MCH: 28.4 pg (ref 26.0–34.0)
MCHC: 30.9 g/dL (ref 30.0–36.0)
MCV: 92.1 fL (ref 80.0–100.0)
Platelets: 600 10*3/uL — ABNORMAL HIGH (ref 150–400)
RBC: 3.41 MIL/uL — ABNORMAL LOW (ref 3.87–5.11)
RDW: 17.2 % — ABNORMAL HIGH (ref 11.5–15.5)
WBC: 9.2 10*3/uL (ref 4.0–10.5)
nRBC: 0 % (ref 0.0–0.2)

## 2024-05-21 LAB — GLUCOSE, CAPILLARY
Glucose-Capillary: 144 mg/dL — ABNORMAL HIGH (ref 70–99)
Glucose-Capillary: 164 mg/dL — ABNORMAL HIGH (ref 70–99)
Glucose-Capillary: 134 mg/dL — ABNORMAL HIGH (ref 70–99)

## 2024-05-21 LAB — BASIC METABOLIC PANEL WITH GFR
Anion gap: 7 (ref 5–15)
BUN: 15 mg/dL (ref 8–23)
CO2: 25 mmol/L (ref 22–32)
Calcium: 7.9 mg/dL — ABNORMAL LOW (ref 8.9–10.3)
Chloride: 104 mmol/L (ref 98–111)
Creatinine, Ser: 0.46 mg/dL (ref 0.44–1.00)
GFR, Estimated: >60 mL/min (ref >60)
Glucose, Bld: 131 mg/dL — ABNORMAL HIGH (ref 70–99)
Potassium: 4.2 mmol/L (ref 3.5–5.1)
Sodium: 136 mmol/L (ref 135–145)

## 2024-05-21 MED ORDER — VANCOMYCIN HCL 1250 MG/250ML IV SOLN
1250.0000 mg | Freq: Once | INTRAVENOUS | Status: AC
  Administered 2024-05-21: 1250 mg via INTRAVENOUS
  Filled 2024-05-21: qty 250

## 2024-05-21 MED ORDER — LOPERAMIDE HCL 2 MG PO CAPS
2.0000 mg | ORAL_CAPSULE | Freq: Once | ORAL | Status: AC
  Administered 2024-05-21: 2 mg via ORAL
  Filled 2024-05-21: qty 1

## 2024-05-21 MED ORDER — VANCOMYCIN HCL IN DEXTROSE 1-5 GM/200ML-% IV SOLN
1000.0000 mg | INTRAVENOUS | Status: DC
  Administered 2024-05-22 – 2024-05-26 (×4): 1000 mg via INTRAVENOUS
  Filled 2024-05-21 (×6): qty 200

## 2024-05-21 MED ORDER — LOPERAMIDE HCL 2 MG PO CAPS
2.0000 mg | ORAL_CAPSULE | Freq: Every day | ORAL | Status: DC
  Administered 2024-05-21 – 2024-05-22 (×2): 2 mg via ORAL
  Filled 2024-05-21 (×2): qty 1

## 2024-05-21 MED ORDER — TRAVASOL 10 % IV SOLN
INTRAVENOUS | Status: AC
  Filled 2024-05-21: qty 940.8

## 2024-05-21 NOTE — Progress Notes (Signed)
 Referring Physician(s): Gross,Steven  Supervising Physician: Babcock,G  Patient Status:  [facility] - In-pt  Chief Complaint: Abdominal pain, postop abdominal fluid collections, status post drain placement x 2 on 05/20/2024   Subjective: Patient complaining of some back/abdominal discomfort; occasional loose stools but less than previous; no nausea or vomiting   Allergies: Elemental sulfur  Medications: Prior to Admission medications   Medication Sig Start Date End Date Taking? Authorizing Provider  amLODipine  (NORVASC ) 5 MG tablet Take 1 tablet (5 mg total) by mouth daily. Patient taking differently: Take 5 mg by mouth at bedtime. 04/08/24  Yes Patwardhan, Manish J, MD  ascorbic acid  (VITAMIN C ) 250 MG CHEW Chew 250 mg by mouth daily.   Yes [provider]  aspirin  EC 81 MG tablet Take 81 mg by mouth daily. Swallow whole.   Yes [provider]  cyanocobalamin  (VITAMIN B12) 1000 MCG tablet Take 1,000 mcg by mouth daily.   Yes [provider]  FIBER GUMMIES PO Take 1 capsule by mouth daily.   Yes [provider]  Multiple Vitamin (MULTIVITAMIN WITH MINERALS) TABS tablet Take 1 tablet by mouth daily. 11/10/23  Yes Kraig Peru, MD  Omega-3 Fatty Acids (OMEGA 3 500 PO) Take 500 mg by mouth daily.   Yes [provider]  OVER THE COUNTER MEDICATION Take 650 mg by mouth daily. Total Beets supplement   Yes [provider]  traMADol  (ULTRAM ) 50 MG tablet Take 1-2 tablets (50-100 mg total) by mouth every 6 (six) hours as needed for moderate pain (pain score 4-6) or severe pain (pain score 7-10). 05/08/24  Yes Candyce Champagne, MD     Vital Signs: BP (!) 110/59 (BP Location: Left Arm)   Pulse 94   Temp 98.3 F (36.8 C) (Oral)   Resp 18   Ht 5' 5 (1.651 m)   Wt 134 lb 11.2 oz (61.1 kg)   SpO2 97%   BMI 22.42 kg/m   Physical Exam awake, alert.  Right anterior abdominal and left transgluteal drains intact, right abdominal drain with  turbid yellow fluid, left transgluteal drain with turbid light green fluid; both drains flushed earlier by nursing without difficulty; insertion sites mild to moderately tender.  Imaging: CT GUIDED PERITONEAL/RETROPERITONEAL FLUID DRAIN BY [facility] CATH Result Date: 05/20/2024 Rosetta Cons, MD     05/20/2024  5:44 PM Pre procedural Dx: Post op abscess, multiple Post procedural Dx: Same Technically successful CT guided placed of a 10 Fr drainage catheter placement into the midline ventral and dorsal perirectal abscesses.  A representative aspirated sample was capped and sent to the laboratory for analysis.  EBL: Trace Complications: None immediate Zettie Hillock, MD Pager #: [phone]    CT ABDOMEN PELVIS W CONTRAST Result Date: 05/19/2024 CLINICAL DATA:  History of complicated diverticulitis with perforation status post colostomy takedown with abdominal distension EXAM: CT ABDOMEN AND PELVIS WITH CONTRAST TECHNIQUE: Multidetector CT imaging of the abdomen and pelvis was performed using the standard protocol following bolus administration of intravenous contrast. RADIATION DOSE REDUCTION: This exam was performed according to the departmental dose-optimization program which includes automated exposure control, adjustment of the mA and/or kV according to patient size and/or use of iterative reconstruction technique. CONTRAST:  OMNIPAQUE  IOHEXOL  300 MG/ML  SOLN COMPARISON:  CT abdomen and pelvis dated 10/19/2023 FINDINGS: Lower chest: Bilateral lower lobe relaxation atelectasis adjacent to small bilateral pleural effusions. No pneumothorax. Partially imaged heart size is normal. Hepatobiliary: No focal hepatic lesions. No intra or extrahepatic biliary  ductal dilation. Gallbladder is contracted. 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 No main pancreatic ductal dilation. Spleen: Normal in size without focal abnormality.  Adrenals/Urinary Tract: No adrenal nodules. No hydronephrosis or suspicious renal masses. Punctate nonobstructing left lower pole renal stone. Exophytic lower pole right renal cyst demonstrates peripheral calcification. No specific follow-up imaging recommended. Underdistended urinary bladder contains small volume intraluminal gas and catheter in-situ. Stomach/Bowel: Normal appearance of the stomach. Postsurgical changes of prior low anterior section with colostomy takedown and small bowel anastomosis. Frank dehiscence along the rectal anastomosis extending from 1-3 o'clock (2:76) with moderate volume free air and intraluminal contents extending into the perirectal space and extending superiorly along the presacral space. Fluid and gas tracks to the anterior lower abdomen (2:73), where there is a moderately sized pocket of gas and fluid measuring 10.3 x 6.7 cm (2:72). This collection is also contiguous along the lateral right lower quadrant/pelvic sidewall (2:72). Multiple additional irregular fluid collections insinuate between loops of bowel, for example 3.9 x 2.0 cm right lower quadrant (2:64) and surrounding the descending/proximal sigmoid colon in the left lower quadrant measuring 5.0 x 2.2 cm (2:58). Vascular/Lymphatic: Aortic atherosclerosis. IVC filter in-situ. No enlarged abdominal or pelvic lymph nodes. Reproductive: No adnexal masses. Other: Free air and fluid collections as described. Musculoskeletal: No acute or abnormal lytic or blastic osseous lesions. Small foci of subcutaneous emphysema within the right anterior abdominal wall. Mild body wall edema. Postsurgical changes of the anterior abdominal wall. IMPRESSION: 1. Postsurgical changes of prior low anterior section with colostomy takedown and small bowel anastomosis. Frank dehiscence along the rectal anastomosis extending from 1-3 o'clock with moderate volume free air and intraluminal contents extending into the perirectal space and extending  superiorly along the presacral space. Multiple irregular fluid collections as described. 2. Small bilateral pleural effusions with adjacent relaxation atelectasis. 3.  Aortic Atherosclerosis (ICD10-I70.0). Critical Value/emergent results were called by telephone at the time of interpretation on 05/19/2024 at 1:15 pm to provider [facility] , who verbally acknowledged these results. Electronically Signed   By: Limin  Xu M.D.   On: 05/19/2024 13:45    Labs:  CBC: Recent Labs    05/18/24 1831 05/19/24 1326 05/20/24 0350 05/21/24 0331  WBC 14.8* 13.8* 13.8* 9.2  HGB 10.0* 9.7* 9.7* 9.7*  HCT 30.2* 30.2* 30.6* 31.4*  PLT 473* 491* 541* 600*    COAGS: Recent Labs    10/05/23 0247 10/11/23 1255 10/12/23 0655 05/19/24 2054  INR 1.1 1.1 1.1 1.2  APTT 30  --   --   --     BMP: Recent Labs    05/18/24 0318 05/19/24 0337 05/20/24 0350 05/21/24 0331  NA 134* 132* 134* 136  K 4.0 4.3 4.1 4.2  CL 101 99 101 104  CO2 24 24 24 25   GLUCOSE 150* 124* 129* 131*  BUN 25* 22 23 15   CALCIUM  7.6* 7.5* 7.7* 7.9*  CREATININE 0.59 0.63 0.65 0.46  GFRNONAA >60 >60 >60 >60    LIVER FUNCTION TESTS: Recent Labs    05/13/24 0521 05/15/24 0339 05/19/24 0337 05/20/24 0350  BILITOT 0.7 1.8* 1.4* 1.2  AST 33 51* 54* 32  ALT 25 55* 65* 47*  ALKPHOS 56 78 245* 269*  PROT 5.6* 5.3* 5.5* 5.8*  ALBUMIN  2.1* 1.6* <1.5* <1.5*    Assessment and Plan: 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 in October  of last year; status post robotic rectosigmoid resection /takedown of end colostomy with anastomosis /resection of small intestine with anastomosis, small bowel repair on 05/08/2024; now with postop abdominal fluid collections, status post drain placement x 2 on 05/20/2024; afebrile, WBC normal, hemoglobin stable, creatinine normal, drain fluid cultures pending   Drain Location: RLQ/left transgluteal Size: Fr size: 10  Fr Date of placement: 05/20/24  Currently to: Drain collection device: suction bulb 24 hour output:  Output by Drain (mL) 05/19/24 0701 - 05/19/24 1900 05/19/24 1901 - 05/20/24 0700 05/20/24 0701 - 05/20/24 1900 05/20/24 1901 - 05/21/24 0700 05/21/24 0701 - 05/21/24 0936  Closed System Drain 1 Left;Midline Abdomen Bulb (JP) 10 Fr.   0 40   Closed System Drain 1 Left Buttock Bulb (JP) 10 Fr.   40 310 10      Current examination: Flushes/aspirates easily.  Insertion site unremarkable. Suture  in place. Dressed appropriately.   Plan: Continue TID flushes with 5 cc NS. Record output Q shift. Dressing changes QD or PRN if soiled.  Call IR APP or on call IR MD if difficulty flushing or sudden change in drain output.  Repeat imaging/possible drain injection once output < 10 mL/QD (excluding flush material). Consideration for drain removal if output is < 10 mL/QD (excluding flush material), pending discussion with the providing surgical service.  Discharge planning: Please contact IR APP or on call IR MD prior to patient d/c to ensure appropriate follow up plans are in place. Typically patient will follow up with IR clinic 10-14 days post d/c for repeat imaging/possible drain injection. IR scheduler will contact patient with date/time of appointment. Patient will need to flush drain QD with 5 cc NS, record output QD, dressing changes every 2-3 days or earlier if soiled.   IR will continue to follow - please call with questions or concerns.      Electronically Signed: D. Honore Lux, PA-C 05/21/2024, 9:31 AM   I spent a total of 15 Minutes at the the patient's bedside AND on the patient's hospital floor or unit, greater than 50% of which was counseling/coordinating care for abdominal/pelvic fluid collection drain x 2    Patient ID: Elizabeth Odom, female   DOB: July 30, 1941, 83 y.o.   MRN: [id number]

## 2024-05-21 NOTE — Progress Notes (Signed)
 05/21/2024  Elizabeth Odom [id number] 1941/06/11  CARE TEAM: PCP: Anthon Kins, MD  Outpatient Care Team: Patient Care Team: Anthon Kins, MD as PCP - General (Internal Medicine) Cody Das, MD as PCP - Cardiology (Cardiology) Lajuan Pila, MD as Consulting Physician (Gastroenterology) Shela Derby, MD as Consulting Physician (General Surgery) Glory Larsen, MD (Neurology) Lahoma Pigg, MD as Consulting Physician (Urology) Sandee Crook Margart Shears, MD as Consulting Physician (Cardiology) Candyce Champagne, MD as Consulting Physician (Colon and Rectal Surgery)  Inpatient Treatment Team: Treatment Team:  Candyce Champagne, MD Ruel Cotta, Heinz Llano, MD Ozell Blunt, MD Vladimir Groves Radiology, MD Morse Ard, NT Rimando, Honora Lutes, RN Lexine Redder, MD   Problem List:   Principal Problem:   Diverticular disease Active Problems:   A-fib [facility])   Restless leg   ABLA (acute blood loss anemia)   Need for emotional support   Acute urinary retention   Mixed hyperlipidemia   Essential hypertension   Diverticular disease of left colon   History of DVT (deep vein thrombosis)   History of pulmonary embolus (PE)   Pre-diabetes   Presence of IVC filter   Stricture of sigmoid colon (HCC)   Protein-calorie malnutrition, severe   Hypokalemia   05/08/2024  POST-OPERATIVE DIAGNOSIS:   COLOSTOMY FOR RESECTION, DESIRE FOR OSTOMY TAKEDOWN RECTAL STRICTURE HISTORY OF DIVERTICULITIS WITH PERFORATION & ABSCESS   PROCEDURE:   -ROBOTIC RECTOSIGMOID RESECTION (LAR) -TAKEDOWN OF END COLOSTOMY WITH ANASTOMOSIS -RESECTION OF SMALL INTESTINE WITH ANASTOMOSIS -SMALL BOWEL REPAIR -LYSIS OF ADHESIONS x 115 MINUTES (66% OF CASE),  -INTRAOPERATIVE ASSESSMENT OF TISSUE VASCULAR PERFUSION USING ICG (indocyanine green ) IMMUNOFLUORESCENCE,  -TRANSVERSUS ABDOMINIS PLANE (TAP) BLOCK - BILATERAL -FLEXIBLE SIGMOIDOSCOPY   SURGEON:  Eddye Goodie, MD  OR FINDINGS:  Very  dense adhesions of small bowel especially to pelvis and retroperitoneum.  Extremely concrete dense adhesions with friable tissue near her old prior small bowel anastomosis and distal jejunum.  Required small bowel repair and required small bowel resection including old anastomosis given severe tissues.  Patient had significant fibrotic stricturing of rectal stump at the mid/distal rectal junction.  Could not be released up.  Ended up doing resection of rectal stump to mid/distal rectal junction.  No obvious metastatic disease on visceral parietal peritoneum or liver.   It is a 29mm EEA anastomosis ( distal descending colon  connected to mid/distal rectal junction.)  It rests 7 cm from the anal verge by rigid proctoscopy.  05/20/2024  Post procedural Dx: Post op abscess, multiple   Technically successful CT guided placed of a  10 Fr drainage catheter placement x 2  into the midline ventral and dorsal perirectal abscesses.     Rosetta Cons, MD    Assessment [facility] Stay = 13 days) 13 Days Post-Op    Ileus  Fluid collections raises suspicion of contained leak s/p drainage 6/10    Plan:  Drains placed yesterday in abdomen and transgluteal.  Help appreciated.  Continue IV antibiotics.  Zosyn .  Add vancomycin  with history of MRSA.  Follow-up on cultures to adjust.  Patient denies much abdominal pain and has bowel function without nausea or vomiting.  Will do low-dose clear liquids but I would not advance much beyond that for now.  Imodium x 1 with her intermittent diarrhea.  Challenged to get her comfortable.  She denies pain but is uncomfortable in the bed with her neck.  Talked about trying heating pad.  Continue Tylenol .  Cannot have oral and  IV pain medications and muscle relaxants.  See if that helps.  She wishes to hold off on NG tube.  We will try and respect that for now but low threshold to replace it if needed.  Despite her fluid and gas pockets she is having some  bowel movements.  Hold on anticoagulation with hematochezia.  Transfused 3 units total  TPN.    Partial urine retention status post Foley replacement 6/4.  Failed 72-hour removal.  Placed back in.  Start tamsulosin.  Will need outpatient urology follow-up in 2 weeks  No rectal tubes.  Atrial fibrillation postop.  History of a prior event.    Cardiology following.    Started DOAC Eliquis  6/4.  Recurrent lower GI bleeding most likely at staple line.  Held 6/7.  Continue to hold for now.    Rate controlled switch from amlodipine  to metoprolol .  Mildly tachycardic but they feel okay as long as heart rate less than 120 and blood pressure okay.    Pain control.  Challenge in this patient   Rehydration as tolerated.    -monitor electrolytes & replace as needed.  Keep K>4, Mg>2, Phos>3  -VTE prophylaxis- SCDs.  Anticoagulation as appropriate  -mobilize as tolerated to help recovery.  Enlist therapies in moderate/high risk patients as appropriate  Reasonable placed on telemetry to keep an eye on cardiac rate.  Cardiology and medicine help appreciated.  I updated the patient's status to the patient and nurse.  Discussed with [facility] internal medicine.  Asked Dr. Sherrod Dolphin to follow at least 1 or 2 more days until I get a send she is stable and can come out of stepdown.  Recommendations were made.  Questions were answered.  She expressed understanding & appreciation.  -Disposition: TBD      I reviewed last 24 h vitals and pain scores, last 48 h intake and output, last 24 h labs and trends, and last 24 h imaging results.  I have reviewed this patient's available data, including medical history, events of note, test results, etc as part of my evaluation.   A significant portion of that time was spent in counseling. Care during the described time interval was provided by me.  This care required moderate level of medical decision making.  05/21/2024    Subjective: (Chief  complaint)  Drains placed yesterday.  Patient sitting up in bed.  Feels her head/neck is uncomfortable.  Denies any abdominal pain.  Thirsty.  Some loose bowel movements.  Floor nursing just outside room.  Objective:  Vital signs:  Vitals:   05/20/24 2030 05/20/24 2122 05/21/24 0133 05/21/24 0500  BP: 105/79 (!) 116/58 (!) 108/58   Pulse: 99 95 89   Resp: 16 18 18    Temp: 98.9 F (37.2 C) 98.9 F (37.2 C) 98 F (36.7 C)   TempSrc: Oral Oral    SpO2: 98% 97% 96%   Weight:    61.1 kg  Height:        Last BM Date : 05/20/24  Intake/Output   Yesterday:  06/10 0701 - 06/11 0700 In: 1776.8 [P.O.:180; I.V.:1464.2; IV Piggyback:132.5] Out: 830 [Urine:550; Drains:280] This shift:  Total I/O In: 966.6 [P.O.:120; I.V.:784.3; IV Piggyback:62.3] Out: 240 [Drains:240]  Bowel function:  Flatus: YES  BM:  YES  Drain:  Right lower quadrant drain: Scant purulent  Transgluteal drain: thin brown   Physical Exam:  General: Resting but easily awakens.  Less mumbling.  Talking.  Asking somewhat repeated questions but more directable today and less  confused.  .    Eyes: PERRL, normal EOM.  Sclera clear.  No icterus Neuro: CN II-XII intact w/o focal sensory/motor deficits.  All extremities.  No facial droop. Lymph: No head/neck/groin lymphadenopathy Psych:  No delerium/psychosis/paranoia.  Oriented x 4 HENT: Normocephalic, Mucus membranes moist.  No thrush Neck: Supple, No tracheal deviation.  No obvious thyromegaly.  Prefers to sit with head flexed and chin down.  Does not want to have her neck back.  Pillow adjusted. Chest: No pain to chest wall compression.  Good respiratory excursion.  No audible wheezing CV:  Pulses intact.  Regular rhythm.  No major extremity edema MS: Normal AROM mjr joints.  No obvious deformity  Abdomen:  Soft.  Moderately distended.  Nontender.  No guarding.  No complaints of pain to percussion or benching or cough.  No evidence of  peritonitis. Old ostomy incision with normal healing ridge.    Rectal: deferred today  Ext:   No deformity.  No mjr edema.  No cyanosis Skin: No petechiae / purpurea.  No major sores.  Warm and dry    Results:   Cultures: Recent Results (from the past 720 hours)  Urine Culture     Status: Abnormal   Collection Time: 05/09/24  8:30 PM   Specimen: Urine, Clean Catch  Result Value Ref Range Status   Specimen Description   Final    URINE, CLEAN CATCH Performed at [facility], [address], [place], [place] [address]    Special Requests   Final    NONE Performed at [facility], [address], [place], [place] [address]    Culture (A)  Final    <10,000 COLONIES/mL INSIGNIFICANT GROWTH Performed at [facility], [address], [place], [place] [address]    Report Status 05/11/2024 FINAL  Final    Labs: Results for orders placed or performed during the hospital encounter of 05/08/24 (from the past 48 hours)  Glucose, capillary     Status: Abnormal   Collection Time: 05/19/24  8:06 AM  Result Value Ref Range   Glucose-Capillary 144 (H) 70 - 99 mg/dL    Comment: Glucose reference range applies only to samples taken after fasting for at least 8 hours.  CBC     Status: Abnormal   Collection Time: 05/19/24  1:26 PM  Result Value Ref Range   WBC 13.8 (H) 4.0 - 10.5 K/uL   RBC 3.36 (L) 3.87 - 5.11 MIL/uL   Hemoglobin 9.7 (L) 12.0 - 15.0 g/dL   HCT 13.0 (L) 86.5 - 78.4 %   MCV 89.9 80.0 - 100.0 fL   MCH 28.9 26.0 - 34.0 pg   MCHC 32.1 30.0 - 36.0 g/dL   RDW 69.6 (H) 29.5 - 28.4 %   Platelets 491 (H) 150 - 400 K/uL   nRBC 0.7 (H) 0.0 - 0.2 %    Comment: Performed at [facility], [address], [place], [place] [address]  Prealbumin     Status: Abnormal   Collection Time: 05/19/24  1:26 PM  Result Value Ref Range   Prealbumin 10 (L) 18 - 38 mg/dL    Comment: Performed at [facility], [address], [place], [place] [address]  Glucose, capillary     Status: Abnormal   Collection Time: 05/19/24  4:07 PM  Result Value Ref Range   Glucose-Capillary 113 (H) 70 - 99 mg/dL    Comment: Glucose reference range applies only  to samples taken after fasting for at least 8 hours.  Protime-INR     Status: None   Collection Time: 05/19/24  8:54 PM  Result Value Ref Range   Prothrombin Time 14.9 11.4 - 15.2 seconds   INR 1.2 0.8 - 1.2    Comment: (NOTE) INR goal varies based on device and disease states. Performed at [facility], [address], [place], [place] [address]   Glucose, capillary     Status: Abnormal   Collection Time: 05/19/24 11:56 PM  Result Value Ref Range   Glucose-Capillary 131 (H) 70 - 99 mg/dL    Comment: Glucose reference range applies only to samples taken after fasting for at least 8 hours.  CBC     Status: Abnormal   Collection Time: 05/20/24  3:50 AM  Result Value Ref Range   WBC 13.8 (H) 4.0 - 10.5 K/uL   RBC 3.31 (L) 3.87 - 5.11 MIL/uL   Hemoglobin 9.7 (L) 12.0 - 15.0 g/dL   HCT 47.8 (L) 29.5 - 62.1 %   MCV 92.4 80.0 - 100.0 fL   MCH 29.3 26.0 - 34.0 pg   MCHC 31.7 30.0 - 36.0 g/dL   RDW 30.8 (H) 65.7 - 84.6 %   Platelets 541 (H) 150 - 400 K/uL   nRBC 0.1 0.0 - 0.2 %    Comment: Performed at [facility], [address], [place], [place] [address]  Comprehensive metabolic panel     Status: Abnormal   Collection Time: 05/20/24  3:50 AM  Result Value Ref Range   Sodium 134 (L) 135 - 145 mmol/L   Potassium 4.1 3.5 - 5.1 mmol/L   Chloride 101 98 - 111 mmol/L   CO2 24 22 - 32 mmol/L   Glucose, Bld 129 (H) 70 - 99 mg/dL    Comment: Glucose reference range applies only to samples taken after fasting for at least 8 hours.   BUN 23 8 - 23 mg/dL   Creatinine, Ser 2.84 0.44 - 1.00 mg/dL   Calcium  7.7 (L) 8.9 - 10.3 mg/dL   Total Protein 5.8 (L) 6.5 - 8.1 g/dL   Albumin  <1.5 (L) 3.5 - 5.0 g/dL   AST 32 15 - 41 U/L   ALT 47  (H) 0 - 44 U/L   Alkaline Phosphatase 269 (H) 38 - 126 U/L   Total Bilirubin 1.2 0.0 - 1.2 mg/dL   GFR, Estimated >13 >24 mL/min    Comment: (NOTE) Calculated using the CKD-EPI Creatinine Equation (2021)    Anion gap 9 5 - 15    Comment: Performed at [facility], [address] [place], [place] [address]  Glucose, capillary     Status: Abnormal   Collection Time: 05/20/24  7:49 AM  Result Value Ref Range   Glucose-Capillary 131 (H) 70 - 99 mg/dL    Comment: Glucose reference range applies only to samples taken after fasting for at least 8 hours.  Glucose, capillary     Status: Abnormal   Collection Time: 05/20/24  6:16 PM  Result Value Ref Range   Glucose-Capillary 130 (H) 70 - 99 mg/dL    Comment: Glucose reference range applies only to samples taken after fasting for at least 8 hours.  Glucose, capillary     Status: Abnormal   Collection Time: 05/21/24  2:09 AM  Result Value Ref Range   Glucose-Capillary 144 (H) 70 - 99 mg/dL    Comment: Glucose reference range applies only  to samples taken after fasting for at least 8 hours.  CBC     Status: Abnormal   Collection Time: 05/21/24  3:31 AM  Result Value Ref Range   WBC 9.2 4.0 - 10.5 K/uL   RBC 3.41 (L) 3.87 - 5.11 MIL/uL   Hemoglobin 9.7 (L) 12.0 - 15.0 g/dL   HCT 16.1 (L) 09.6 - 04.5 %   MCV 92.1 80.0 - 100.0 fL   MCH 28.4 26.0 - 34.0 pg   MCHC 30.9 30.0 - 36.0 g/dL   RDW 40.9 (H) 81.1 - 91.4 %   Platelets 600 (H) 150 - 400 K/uL   nRBC 0.0 0.0 - 0.2 %    Comment: Performed at [facility], [address] [place], [place] [address]    Imaging / Studies: CT GUIDED PERITONEAL/RETROPERITONEAL FLUID DRAIN BY [facility] CATH Result Date: 05/20/2024 Rosetta Cons, MD     05/20/2024  5:44 PM Pre procedural Dx: Post op abscess, multiple Post procedural Dx: Same Technically successful CT guided placed of a 10 Fr drainage catheter placement into the midline ventral and dorsal perirectal  abscesses.  A representative aspirated sample was capped and sent to the laboratory for analysis.  EBL: Trace Complications: None immediate Zettie Hillock, MD Pager #: [phone]    CT ABDOMEN PELVIS W CONTRAST Result Date: 05/19/2024 CLINICAL DATA:  History of complicated diverticulitis with perforation status post colostomy takedown with abdominal distension EXAM: CT ABDOMEN AND PELVIS WITH CONTRAST TECHNIQUE: Multidetector CT imaging of the abdomen and pelvis was performed using the standard protocol following bolus administration of intravenous contrast. RADIATION DOSE REDUCTION: This exam was performed according to the departmental dose-optimization program which includes automated exposure control, adjustment of the mA and/or kV according to patient size and/or use of iterative reconstruction technique. CONTRAST:  OMNIPAQUE  IOHEXOL  300 MG/ML  SOLN COMPARISON:  CT abdomen and pelvis dated 10/19/2023 FINDINGS: Lower chest: Bilateral lower lobe relaxation atelectasis adjacent to small bilateral pleural effusions. No pneumothorax. Partially imaged heart size is normal. Hepatobiliary: No focal hepatic lesions. No intra or extrahepatic biliary ductal dilation. Gallbladder is contracted. 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 No main pancreatic ductal dilation. Spleen: Normal in size without focal abnormality. Adrenals/Urinary Tract: No adrenal nodules. No hydronephrosis or suspicious renal masses. Punctate nonobstructing left lower pole renal stone. Exophytic lower pole right renal cyst demonstrates peripheral calcification. No specific follow-up imaging recommended. Underdistended urinary bladder contains small volume intraluminal gas and catheter in-situ. Stomach/Bowel: Normal appearance of the stomach. Postsurgical changes of prior low anterior section with colostomy takedown and small bowel anastomosis. Frank dehiscence  along the rectal anastomosis extending from 1-3 o'clock (2:76) with moderate volume free air and intraluminal contents extending into the perirectal space and extending superiorly along the presacral space. Fluid and gas tracks to the anterior lower abdomen (2:73), where there is a moderately sized pocket of gas and fluid measuring 10.3 x 6.7 cm (2:72). This collection is also contiguous along the lateral right lower quadrant/pelvic sidewall (2:72). Multiple additional irregular fluid collections insinuate between loops of bowel, for example 3.9 x 2.0 cm right lower quadrant (2:64) and surrounding the descending/proximal sigmoid colon in the left lower quadrant measuring 5.0 x 2.2 cm (2:58). Vascular/Lymphatic: Aortic atherosclerosis. IVC filter in-situ. No enlarged abdominal or pelvic lymph nodes. Reproductive: No adnexal masses. Other: Free air and fluid collections as described. Musculoskeletal: No acute or abnormal lytic or blastic osseous lesions. Small  foci of subcutaneous emphysema within the right anterior abdominal wall. Mild body wall edema. Postsurgical changes of the anterior abdominal wall. IMPRESSION: 1. Postsurgical changes of prior low anterior section with colostomy takedown and small bowel anastomosis. Frank dehiscence along the rectal anastomosis extending from 1-3 o'clock with moderate volume free air and intraluminal contents extending into the perirectal space and extending superiorly along the presacral space. Multiple irregular fluid collections as described. 2. Small bilateral pleural effusions with adjacent relaxation atelectasis. 3.  Aortic Atherosclerosis (ICD10-I70.0). Critical Value/emergent results were called by telephone at the time of interpretation on 05/19/2024 at 1:15 pm to provider [facility] , who verbally acknowledged these results. Electronically Signed   By: Limin  Xu M.D.   On: 05/19/2024 13:45     Medications / Allergies: per chart  Antibiotics: Anti-infectives  (From admission, onward)    Start     Dose/Rate Route Frequency Ordered Stop   05/19/24 1400  piperacillin -tazobactam (ZOSYN ) IVPB 3.375 g        3.375 g 12.5 mL/hr over 240 Minutes Intravenous Every 8 hours 05/19/24 1303 05/24/24 1359   05/09/24 0900  erythromycin  250 mg in sodium chloride  0.9 % 100 mL IVPB        250 mg 100 mL/hr over 60 Minutes Intravenous Every 8 hours 05/09/24 0732 05/11/24 0044   05/08/24 2200  cefoTEtan  (CEFOTAN ) 2 g in sodium chloride  0.9 % 100 mL IVPB        2 g 200 mL/hr over 30 Minutes Intravenous Every 12 hours 05/08/24 1759 05/09/24 0749   05/08/24 1400  neomycin  (MYCIFRADIN ) tablet 1,000 mg  Status:  Discontinued       Placed in And Linked Group   1,000 mg Oral 3 times per day 05/08/24 1119 05/08/24 1120   05/08/24 1400  metroNIDAZOLE  (FLAGYL ) tablet 1,000 mg  Status:  Discontinued       Placed in And Linked Group   1,000 mg Oral 3 times per day 05/08/24 1119 05/08/24 1120   05/08/24 1130  cefoTEtan  (CEFOTAN ) 2 g in sodium chloride  0.9 % 100 mL IVPB        2 g 200 mL/hr over 30 Minutes Intravenous On call to O.R. 05/08/24 1119 05/09/24 0749         Note: Portions of this report may have been transcribed using voice recognition software. Every effort was made to ensure accuracy; however, inadvertent computerized transcription errors may be present.   Any transcriptional errors that result from this process are unintentional.    Eddye Goodie, MD, FACS, MASCRS Esophageal, Gastrointestinal & Colorectal Surgery Robotic and Minimally Invasive Surgery  [facility] [address] [place], [place] [address] [phone] Fax [phone] Main  CONTACT INFORMATION: Weekday (9AM-5PM): Call [facility] main office at [phone] Weeknight (5PM-9AM) or Weekend/Holiday: Check EPIC Web Links tab & use AMION (password  [email or web address]) for General Surgery [facility] coverage  Please, DO NOT use  SecureChat  (it is not reliable communication to reach operating surgeons & will lead to a delay in care).   Epic staff messaging available for outptient concerns needing 1-2 business day response.      05/21/2024  6:13 AM

## 2024-05-21 NOTE — Progress Notes (Signed)
 PROGRESS NOTE    Elizabeth Odom  WUJ:[id number] DOB: 04-Jan-1941 DOA: 05/08/2024 PCP: Anthon Kins, MD   Brief Narrative:  83 year old female with past medical history significant for remote history of A-fib evaluated with a loop recorder which was negative, prior DVT and small PE currently not on any anticoagulation because of some history of bleeding in the past had IVC filter which was subsequently removed at some point), anemia, PACs, HTN, mild cognitive impairment,  large bowel obstruction requiring colon resection and colostomy was admitted for colostomy takedown by general surgery.  Patient underwent elective robotic rectosigmoid resection, colostomy takedown with bowel anastomosis, extensive lysis of additions on 05/08/24 after preop cardiac clearance. On 05/09/24 evening, patient developed AMS after polypharmacy along with A-fib with RVR and TRH was consulted. Patient was transferred to stepdown unit. Course complicated by A-fib with RVR: Cardiology was consulted.  Subsequently, rate got controlled.  Cardiology signed off on 05/19/2024.  Eliquis  on hold.  Hospitalization also complicated by hypotension/rectal bleeding and syncope on 05/11/2024.  Patient also had postop ileus needing NG tube and TPN.  Imaging on 05/19/2024 showed possible abscesses.  She was started on IV antibiotics by general surgery on 05/20/2024.  She underwent IR guided drains placed on 05/20/2024.  Assessment & Plan:   Persistent A-fib/flutter - Still has mild intermittent tachycardia.  Continue metoprolol  succinate.  Cardiology signed off on 05/19/2024.  Recommended to restart Eliquis  when okay by surgery.  Outpatient follow-up with cardiology  Status post electiverobotic rectosigmoid resection, colostomy takedown with bowel anastomosis, extensive lysis of additions on 05/08/24 with hospital course complicated by ileus and now possible intra-abdominal abscesses - Management as per primary surgical team.  Currently still getting  TPN. -Imaging on 05/19/2024 showed possible abscesses.  She was started on IV antibiotics by general surgery on 05/20/2024.  She underwent IR guided drains placed on 05/20/2024.  Acute blood loss anemia - Status post 4 units packed red cell transfusion.  Hemoglobin 9.7 today  Acute kidney injury - Resolved  Hypoalbuminemia - Continue TPN  Leukocytosis - Resolved  Thrombocytosis - Possibly reactive.  Monitor intermittently  Hyponatremia -Improved  Syncope - Had syncope on 05/11/2024 1 patient was on bedside commode.  Possibly due to hypotension or vasovagal.  Echo with preserved LVEF.  No further syncope since then  Urinary retention--continues to have Foley catheter.  Will need outpatient follow-up with urology  Acute metabolic encephalopathy/delirium - Resolved.  Monitor mental status.  Fall precautions.  Minimize opiates and sedatives  Elevated LFTs -Improving.  Monitor intermittently   Subjective: Patient seen and examined at bedside.  Feels weak.  Does not feel well.  No fever, agitation, vomiting reported.   Objective: Vitals:   05/20/24 2122 05/21/24 0133 05/21/24 0500 05/21/24 0645  BP: (!) 116/58 (!) 108/58  (!) 110/59  Pulse: 95 89  94  Resp: 18 18  18   Temp: 98.9 F (37.2 C) 98 F (36.7 C)  98.3 F (36.8 C)  TempSrc: Oral   Oral  SpO2: 97% 96%  97%  Weight:   61.1 kg   Height:        Intake/Output Summary (Last 24 hours) at 05/21/2024 1145 Last data filed at 05/21/2024 1000 Gross per 24 hour  Intake 2292.64 ml  Output 1120 ml  Net 1172.64 ml   Filed Weights   05/18/24 0500 05/20/24 0500 05/21/24 0500  Weight: 61 kg 61.2 kg 61.1 kg    Examination:  General exam: Appears calm and comfortable.  Looks chronically  ill and deconditioned. Respiratory system: Bilateral decreased breath sounds at bases with scattered crackles Cardiovascular system: S1 & S2 heard, Rate controlled Gastrointestinal system: Abdomen is distended, soft and mildly tender.   Abdominal drains present.   Extremities: No cyanosis, clubbing, edema  Central nervous system: Alert and oriented.  Slow to respond.  Poor historian.  No focal neurological deficits. Moving extremities Skin: No rashes, lesions or ulcers Psychiatry: Flat affect.  Not agitated.    Data Reviewed: I have personally reviewed following labs and imaging studies  CBC: Recent Labs  Lab 05/18/24 0318 05/18/24 1831 05/19/24 1326 05/20/24 0350 05/21/24 0331  WBC 17.9* 14.8* 13.8* 13.8* 9.2  HGB 9.8* 10.0* 9.7* 9.7* 9.7*  HCT 29.5* 30.2* 30.2* 30.6* 31.4*  MCV 88.3 88.3 89.9 92.4 92.1  PLT 430* 473* 491* 541* 600*   Basic Metabolic Panel: Recent Labs  Lab 05/15/24 0339 05/16/24 0601 05/17/24 0008 05/18/24 0318 05/19/24 0337 05/20/24 0350 05/21/24 0331  NA 132*   < > 133* 134* 132* 134* 136  K 3.4*   < > 3.8 4.0 4.3 4.1 4.2  CL 98   < > 98 101 99 101 104  CO2 25   < > 25 24 24 24 25   GLUCOSE 128*   < > 134* 150* 124* 129* 131*  BUN 28*   < > 46* 25* 22 23 15   CREATININE 0.71   < > 0.99 0.59 0.63 0.65 0.46  CALCIUM  7.9*   < > 7.3* 7.6* 7.5* 7.7* 7.9*  MG 1.9  --  2.0 2.0 2.1  --   --   PHOS 3.0  --  3.4 2.5 3.5  --   --    < > = values in this interval not displayed.   GFR: Estimated Creatinine Clearance: 47.9 mL/min (by C-G formula based on SCr of 0.46 mg/dL). Liver Function Tests: Recent Labs  Lab 05/15/24 0339 05/19/24 0337 05/20/24 0350  AST 51* 54* 32  ALT 55* 65* 47*  ALKPHOS 78 245* 269*  BILITOT 1.8* 1.4* 1.2  PROT 5.3* 5.5* 5.8*  ALBUMIN  1.6* <1.5* <1.5*   No results for input(s): LIPASE, AMYLASE in the last 168 hours. No results for input(s): AMMONIA in the last 168 hours. Coagulation Profile: Recent Labs  Lab 05/19/24 2054  INR 1.2   Cardiac Enzymes: No results for input(s): CKTOTAL, CKMB, CKMBINDEX, TROPONINI in the last 168 hours. BNP (last 3 results) No results for input(s): PROBNP in the last 8760 hours. HbA1C: No results for  input(s): HGBA1C in the last 72 hours. CBG: Recent Labs  Lab 05/19/24 2356 05/20/24 0749 05/20/24 1816 05/21/24 0209 05/21/24 0754  GLUCAP 131* 131* 130* 144* 164*   Lipid Profile: Recent Labs    05/19/24 0340  TRIG 120   Thyroid  Function Tests: No results for input(s): TSH, T4TOTAL, FREET4, T3FREE, THYROIDAB in the last 72 hours. Anemia Panel: No results for input(s): VITAMINB12, FOLATE, FERRITIN, TIBC, IRON, RETICCTPCT in the last 72 hours. Sepsis Labs: No results for input(s): PROCALCITON, LATICACIDVEN in the last 168 hours.  No results found for this or any previous visit (from the past 240 hours).       Radiology Studies: CT GUIDED PERITONEAL/RETROPERITONEAL FLUID DRAIN BY [facility] CATH Result Date: 05/21/2024 INDICATION: Multiple pelvic and abdominal postoperative abscesses. Planned placement of percutaneous drainage catheters in the 2 largest collections. EXAM: CT-guided drain placement x2 TECHNIQUE: Multidetector CT imaging of the pelvis was performed following the standard protocol without IV contrast. RADIATION DOSE REDUCTION: This  exam was performed according to the departmental dose-optimization program which includes automated exposure control, adjustment of the mA and/or kV according to patient size and/or use of iterative reconstruction technique. MEDICATIONS: The patient is currently admitted to the hospital and receiving intravenous antibiotics. The antibiotics were administered within an appropriate time frame prior to the initiation of the procedure. ANESTHESIA/SEDATION: Moderate (conscious) sedation was employed during this procedure. A total of Versed  1.5 mg and Fentanyl  75 mcg was administered intravenously by the radiology nurse. Total intra-service moderate Sedation Time: 58 minutes. The patient's level of consciousness and vital signs were monitored continuously by radiology nursing throughout the procedure under my direct supervision.  COMPLICATIONS: None immediate. PROCEDURE: Informed written consent was obtained from the patient after a thorough discussion of the procedural risks, benefits and alternatives. All questions were addressed. Maximal Sterile Barrier Technique was utilized including caps, mask, sterile gowns, sterile gloves, sterile drape, hand hygiene and skin antiseptic. A timeout was performed prior to the initiation of the procedure. With the patient in a supine position, radiopaque markers were placed near the umbilicus and initial images of the pelvis were obtained. The patient's skin was then marked, prepped, and draped in the usual sterile fashion. Local anesthesia was achieved by infiltrating subcutaneous tissue with 1% lidocaine . A small incision was made in the right lower quadrant and a 7 cm Yueh needle was advanced and repeat imaging demonstrated the needle to be within the abscess. The needle was withdrawn leaving the cannula in position for an Amplatz wire to be advanced into the collection. The access site was then dilated using a 10 [place] dilator. A 10 French drainage catheter was then advanced over the guidewire and placed in the collection. Retention suture, sterile dressing, and JP bulb for applied. The patient was then rotated to a prone position with radiopaque markers placed on the gluteal region. Repeat imaging was then used to try angulate location. The patient's skin was prepped and draped in usual sterile fashion. Local infiltration with 1% lidocaine  was used to anesthetize the region. A small incision was then made and again a 7 cm Yueh needle was advanced from the skin into the collection. The needle was withdrawn and an Amplatz wire was advanced through the cannula. Repeat imaging demonstrated satisfactory position. The access site was dilated using a 10 French fascial dilator. A 10 French drainage catheter was then advanced over the guidewire and locked in position. Retention suture and sterile dressing  applied. The catheter was connected to a JP bulb. IMPRESSION: Satisfactory placement of 210 French drainage catheters and the largest pelvic abscess collections in this patient. Ventral near the umbilicus and transgluteal. Electronically Signed   By: Susan Ensign   On: 05/21/2024 10:00   CT ABDOMEN PELVIS W CONTRAST Result Date: 05/19/2024 CLINICAL DATA:  History of complicated diverticulitis with perforation status post colostomy takedown with abdominal distension EXAM: CT ABDOMEN AND PELVIS WITH CONTRAST TECHNIQUE: Multidetector CT imaging of the abdomen and pelvis was performed using the standard protocol following bolus administration of intravenous contrast. RADIATION DOSE REDUCTION: This exam was performed according to the departmental dose-optimization program which includes automated exposure control, adjustment of the mA and/or kV according to patient size and/or use of iterative reconstruction technique. CONTRAST:  OMNIPAQUE  IOHEXOL  300 MG/ML  SOLN COMPARISON:  CT abdomen and pelvis dated 10/19/2023 FINDINGS: Lower chest: Bilateral lower lobe relaxation atelectasis adjacent to small bilateral pleural effusions. No pneumothorax. Partially imaged heart size is normal. Hepatobiliary: No focal hepatic lesions. No  intra or extrahepatic biliary ductal dilation. Gallbladder is contracted. 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 No main pancreatic ductal dilation. Spleen: Normal in size without focal abnormality. Adrenals/Urinary Tract: No adrenal nodules. No hydronephrosis or suspicious renal masses. Punctate nonobstructing left lower pole renal stone. Exophytic lower pole right renal cyst demonstrates peripheral calcification. No specific follow-up imaging recommended. Underdistended urinary bladder contains small volume intraluminal gas and catheter in-situ. Stomach/Bowel: Normal appearance of the stomach.  Postsurgical changes of prior low anterior section with colostomy takedown and small bowel anastomosis. Frank dehiscence along the rectal anastomosis extending from 1-3 o'clock (2:76) with moderate volume free air and intraluminal contents extending into the perirectal space and extending superiorly along the presacral space. Fluid and gas tracks to the anterior lower abdomen (2:73), where there is a moderately sized pocket of gas and fluid measuring 10.3 x 6.7 cm (2:72). This collection is also contiguous along the lateral right lower quadrant/pelvic sidewall (2:72). Multiple additional irregular fluid collections insinuate between loops of bowel, for example 3.9 x 2.0 cm right lower quadrant (2:64) and surrounding the descending/proximal sigmoid colon in the left lower quadrant measuring 5.0 x 2.2 cm (2:58). Vascular/Lymphatic: Aortic atherosclerosis. IVC filter in-situ. No enlarged abdominal or pelvic lymph nodes. Reproductive: No adnexal masses. Other: Free air and fluid collections as described. Musculoskeletal: No acute or abnormal lytic or blastic osseous lesions. Small foci of subcutaneous emphysema within the right anterior abdominal wall. Mild body wall edema. Postsurgical changes of the anterior abdominal wall. IMPRESSION: 1. Postsurgical changes of prior low anterior section with colostomy takedown and small bowel anastomosis. Frank dehiscence along the rectal anastomosis extending from 1-3 o'clock with moderate volume free air and intraluminal contents extending into the perirectal space and extending superiorly along the presacral space. Multiple irregular fluid collections as described. 2. Small bilateral pleural effusions with adjacent relaxation atelectasis. 3.  Aortic Atherosclerosis (ICD10-I70.0). Critical Value/emergent results were called by telephone at the time of interpretation on 05/19/2024 at 1:15 pm to provider [facility] , who verbally acknowledged these results. Electronically Signed    By: Limin  Xu M.D.   On: 05/19/2024 13:45        Scheduled Meds:  acetaminophen   500 mg Oral TID WC & HS   ascorbic acid   250 mg Oral Daily   Chlorhexidine  Gluconate Cloth  6 each Topical Daily   cyanocobalamin   1,000 mcg Oral Daily   liver oil-zinc  oxide   Topical BID   loperamide  2 mg Oral QHS   metoprolol  succinate  25 mg Oral Daily   pantoprazole   40 mg Oral Daily   polycarbophil  625 mg Oral Daily   sodium chloride  flush  10-40 mL Intracatheter Q12H   sodium chloride  flush  3 mL Intravenous Q12H   sodium chloride  flush  5 mL Intracatheter Q8H   tamsulosin  0.4 mg Oral QPC supper   Continuous Infusions:  sodium chloride      lactated ringers      piperacillin -tazobactam (ZOSYN )  IV 3.375 g (05/21/24 0505)   TPN ADULT (ION) 70 mL/hr at 05/20/24 1850   TPN ADULT (ION)     [START ON 05/22/2024] vancomycin             Dhriti Fales Maury Space, MD [facility] 05/21/2024, 11:45 AM

## 2024-05-21 NOTE — Progress Notes (Signed)
 Occupational Therapy Treatment Patient Details Name: Elizabeth Odom MRN: [id number] DOB: 07/14/1941 Today's Date: 05/21/2024   History of present illness 83 y.o. female s/p rectosigmoid resection takedown on 5/29. 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   OT comments  Patient initially agreeable to attempting to move but then changing mind when attempts were made. Patient not agreeable to positioning education for sidelying to reduce pressure on bottom with patient continuously reporting pain in this area. Nurse made aware. Patient is +2 for movement at bed level with this level of pain which is a change from previous session. Patient lives at home alone and does not have this level of support. Patient would continue to benefit from skilled OT services at this time while admitted and after d/c to address noted deficits in order to improve overall safety and independence in ADLs. Patient will benefit from continued inpatient follow up therapy, <3 hours/day.       If plan is discharge home, recommend the following:  A lot of help with bathing/dressing/bathroom;Assistance with cooking/housework;Direct supervision/assist for medications management;Assist for transportation;Direct supervision/assist for financial management;Help with stairs or ramp for entrance;Two people to help with walking and/or transfers   Equipment Recommendations  None recommended by OT       Precautions / Restrictions Precautions Precautions: Fall Precaution/Restrictions Comments: ABD surgery, gluteal JP and abdominal JP Restrictions Weight Bearing Restrictions Per Provider Order: No              ADL either performed or assessed with clinical judgement   ADL Overall ADL's : Needs  assistance/impaired       General ADL Comments: patient in room reporting that her tail hurts her. patient was educated on lying on R side with more pillows to support patient in sidelying to reduce pressure on bottom. patient initally agreeable but upon attempts to engage in pillow placement patient declining movement. patient asking for [facility] to be raised and then placed back down in same statement. patient then declining to move anymore and asking for TV to be turned back on. nurse made aware.      Cognition Arousal: Alert Behavior During Therapy: Flat affect               OT - Cognition Comments: patient upset changing mind and having hard time understanding positioning to help with pain management.                 Following commands: Impaired                      Pertinent Vitals/ Pain       Pain Assessment Pain Assessment: Faces Faces Pain Scale: Hurts whole lot Pain Location: abdomen with activity Pain Descriptors / Indicators: Grimacing, Constant, Discomfort Pain Intervention(s): Limited activity within patient's tolerance, Monitored during session         Frequency  Min 2X/week        Progress Toward Goals  OT Goals(current goals can now be found in the care plan section)  Progress towards OT goals: Not progressing toward goals - comment (pain and poor insight to deficits)     Plan         AM-PAC OT 6 Clicks Daily Activity     Outcome Measure   Help from another person  eating meals?: Total (npo) Help from another person taking care of personal grooming?: A Little Help from another person toileting, which includes using toliet, bedpan, or urinal?: Total Help from another person bathing (including washing, rinsing, drying)?: Total Help from another person to put on and taking off regular upper body clothing?: A Lot Help from another person to put on and taking off regular lower body clothing?: Total 6 Click Score: 9    End of Session     OT Visit Diagnosis: Unsteadiness on feet (R26.81);Pain;Muscle weakness (generalized) (M62.81)   Activity Tolerance Patient limited by pain   Patient Left in bed;with call bell/phone within reach;with bed alarm set   Nurse Communication          Time: 1610-9604 OT Time Calculation (min): 11 min  Charges: OT General Charges $OT Visit: 1 Visit OT Treatments $Therapeutic Activity: 8-22 mins  Wynette Heckler, MS Acute Rehabilitation Department Office# [phone]   Jame Maze 05/21/2024, 10:37 AM

## 2024-05-21 NOTE — Plan of Care (Signed)
  Problem: Education: Goal: Verbalization of understanding of the causes of altered bowel function will improve Outcome: Progressing

## 2024-05-21 NOTE — Progress Notes (Signed)
 Nutrition Follow-up  DOCUMENTATION CODES:   Severe malnutrition in context of chronic illness  INTERVENTION:   -Continue TPN management per Pharmacy   -Monitor for diet advancement  NUTRITION DIAGNOSIS:   Severe Malnutrition related to chronic illness as evidenced by severe fat depletion, severe muscle depletion.  Ongoing.  GOAL:   Patient will meet greater than or equal to 90% of their needs  Meeting with TPN  MONITOR:   Diet advancement, Labs, Weight trends, TPN  ASSESSMENT:   83 year old female with PMH HTN, mild cognitive impairment, asd large bowel obstruction (October 2024) s/p Hartman's resection and colostomy creation who was admitted 5/29 for colostomy reversal.  5/29 Admit; s/p colostomy reversal, small bowel resection, and extensive lysis of adhesions; CLD 5/30 Soft diet -> FLD 6/2 NPO; NGT placed; TPN to be initiated 6/3 CLD 6/4-6/5 Dysphagia 1 -> Soft-> Reg diet, Calorie Count #1 completed, consumed 11-14% of needs 6/6 Soft diet 6/7-6/8 Calorie Count #2 completed, consumed 10-22% of needs 6/9 Heart Healthy -> NPO 6/10 s/p drain placement  Patient on clear liquids now. Will continue to monitor for diet advancement and improvements in PO.  TPN continues at goal rate of 70 ml/hr, providing 1680 kcals and 94g protein.  Admission weight: 127 lbs Current weight: 134 lbs  Medications: Vitamin C , Vitamin B-12, Imodium, Fibercon  Labs reviewed: CBGs: 130-164  Diet Order:   Diet Order             Diet clear liquid Room service appropriate? Yes; Fluid consistency: Thin; Fluid restriction: 1200 mL Fluid  Diet effective now           Diet - low sodium heart healthy                   EDUCATION NEEDS:   Education needs have been addressed  Skin:  Skin Assessment: Skin Integrity Issues: Skin Integrity Issues:: Stage II, Incisions Stage II: Right Buttocks Incisions: Abdomen  Last BM:  6/11 -type 7  Height:   Ht Readings from Last 1  Encounters:  05/09/24 5' 5 (1.651 m)    Weight:   Wt Readings from Last 1 Encounters:  05/21/24 61.1 kg    BMI:  Body mass index is 22.42 kg/m.  Estimated Nutritional Needs:   Kcal:  1600-1750 kcals  Protein:  80-95 grams  Fluid:  >/= 1.6L   Arna Better, MS, RD, LDN Inpatient Clinical Dietitian Contact via Secure chat

## 2024-05-21 NOTE — Progress Notes (Signed)
 PHARMACY - TOTAL PARENTERAL NUTRITION CONSULT NOTE   Indication: Prolonged ileus  Patient Measurements: Height: 5' 5 (165.1 cm) Weight: 61.1 kg (134 lb 11.2 oz) IBW/kg (Calculated) : 57 TPN AdjBW (KG): 61.3 Body mass index is 22.42 kg/m.  Assessment: 37 yoF with history of diverticulitis with perforation and abscess. 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 She requested ostomy takedown and underwent LOA, colostomy takedown, small bowel resection with anastomosis on 5/29. Pharmacy is consulted to dose TPN starting 6/2 for postop ileus.  Now with anastomotic leak complicated by multiple-intraabdominal abscesses  Glucose / Insulin : no Hx DM, A1c 5.1% - CBGs mostly well controlled (goal 100-150; range 130-164) on current insulin  regimen (3 units yesterday) Electrolytes: All lytes now WNL, mostly stable - Goal K >4, Mg >2, Phos >3 Renal: SCr, BUN stable WNL; UOP adequate (per charting) Hepatic: mild LFT bump appears mostly recovered; Alk Phos still trending up slightly - albumin  remains low - Tbili declined to WNL - TG stable WNL (6/9) I/O: minimal PO intake; low threshold per CCS to replace NGT - drain OP ~200 ml yesterday after placement - no mIVF - having small BMs, last on 6/11 GI Imaging:  - 6/9 CT: [name] dehiscence along the rectal anastomosis, free air and multiple fluid collections GI Surgeries / Procedures: - 5/29: LAR, SBR, colostomy takedown, LOA - 6/10 drains placed x 2 in IR (midline abd, transgluteal)  Central access: Double lumen PICC placed 6/2 TPN start date: 6/2  Nutritional Goals: - Goal TPN rate is 70 mL/hr (provides 94 g of protein and 1680 kcals per day)  RD Assessment:  Estimated Needs Total Energy Estimated Needs: 1600-1750 kcals Total Protein Estimated Needs: 80-95 grams Total Fluid Estimated Needs: >/= 1.6L  Current Nutrition:   Magic cup TID per RD.  Pt refusing all Boost Plus supplements. Initial calorie count interrupted by need for drain placement; now back to CLD and will need to restart calorie count as able  Plan:  At 18:00 Continue TPN at goal rate of 70 mL/hr Slightly decreasing dextrose  in order to maximize protein for healing Electrolytes in TPN: No change Na 150 mEq/L (max) K 50 mEq/L Ca 5 mEq/L Mg 5 mEq/L Phos 15 mmol/L Cl:Ac 1:1 Discontinue SSI with good CBG control (and slightly lower dextrose  in TPN) Add standard MVI + trace to TPN Chromium remains on hold d/t critical shortage Monitor TPN labs on Mon/Thurs   Tera Fellows, PharmD, BCPS [phone] 05/21/2024, 8:58 AM

## 2024-05-21 NOTE — Progress Notes (Signed)
 Pharmacy Antibiotic Note  Elizabeth Odom is a 83 y.o. female who is known to pharmacy from current TPN consult.  Pharmacy has been consulted on 05/21/24 to dose vancomycin  for abdominal abscess.  Today, 05/21/2024: - afeb - wbc down 9.2 - scr 0.65 on 6/10 (crcl~48) - day #3 zosyn    Plan: - vancomycin  1250 mg x1, then 1000 mg q24h for AUC 515 - zosyn  3.375 gm IV q8h (infuse over 4 hrs) - monitor scr closely while on vancomycin  and zosyn    _________________________________________  Height: 5' 5 (165.1 cm) Weight: 61.1 kg (134 lb 11.2 oz) IBW/kg (Calculated) : 57  Temp (24hrs), Avg:98.4 F (36.9 C), Min:97.8 F (36.6 C), Max:98.9 F (37.2 C)  Recent Labs  Lab 05/16/24 0601 05/17/24 0008 05/17/24 1229 05/18/24 0318 05/18/24 1831 05/19/24 0337 05/19/24 1326 05/20/24 0350 05/21/24 0331  WBC 32.7* 24.4*   < > 17.9* 14.8*  --  13.8* 13.8* 9.2  CREATININE 0.88 0.99  --  0.59  --  0.63  --  0.65  --    < > = values in this interval not displayed.    Estimated Creatinine Clearance: 47.9 mL/min (by C-G formula based on SCr of 0.65 mg/dL).    Allergies  Allergen Reactions   Elemental Sulfur Anaphylaxis      Thank you for allowing pharmacy to be a part of this patient's care.  Spurgeon Dyer 05/21/2024 6:27 AM

## 2024-05-22 DIAGNOSIS — K5790 Diverticulosis of intestine, part unspecified, without perforation or abscess without bleeding: Secondary | ICD-10-CM | POA: Diagnosis not present

## 2024-05-22 LAB — COMPREHENSIVE METABOLIC PANEL WITH GFR
ALT: 50 U/L — ABNORMAL HIGH (ref 0–44)
AST: 42 U/L — ABNORMAL HIGH (ref 15–41)
Albumin: <1.5 g/dL — ABNORMAL LOW (ref 3.5–5.0)
Alkaline Phosphatase: 265 U/L — ABNORMAL HIGH (ref 38–126)
Anion gap: 8 (ref 5–15)
BUN: 29 mg/dL — ABNORMAL HIGH (ref 8–23)
CO2: 24 mmol/L (ref 22–32)
Calcium: 7.8 mg/dL — ABNORMAL LOW (ref 8.9–10.3)
Chloride: 104 mmol/L (ref 98–111)
Creatinine, Ser: 0.59 mg/dL (ref 0.44–1.00)
GFR, Estimated: >60 mL/min (ref >60)
Glucose, Bld: 121 mg/dL — ABNORMAL HIGH (ref 70–99)
Potassium: 4.1 mmol/L (ref 3.5–5.1)
Sodium: 136 mmol/L (ref 135–145)
Total Bilirubin: 0.8 mg/dL (ref 0.0–1.2)
Total Protein: 6 g/dL — ABNORMAL LOW (ref 6.5–8.1)

## 2024-05-22 LAB — CBC
HCT: 30.6 % — ABNORMAL LOW (ref 36.0–46.0)
Hemoglobin: 9.4 g/dL — ABNORMAL LOW (ref 12.0–15.0)
MCH: 28.9 pg (ref 26.0–34.0)
MCHC: 30.7 g/dL (ref 30.0–36.0)
MCV: 94.2 fL (ref 80.0–100.0)
Platelets: 635 10*3/uL — ABNORMAL HIGH (ref 150–400)
RBC: 3.25 MIL/uL — ABNORMAL LOW (ref 3.87–5.11)
RDW: 16.8 % — ABNORMAL HIGH (ref 11.5–15.5)
WBC: 7.9 10*3/uL (ref 4.0–10.5)
nRBC: 0 % (ref 0.0–0.2)

## 2024-05-22 LAB — MAGNESIUM: Magnesium: 2 mg/dL (ref 1.7–2.4)

## 2024-05-22 LAB — PHOSPHORUS: Phosphorus: 3.8 mg/dL (ref 2.5–4.6)

## 2024-05-22 MED ORDER — ADULT MULTIVITAMIN W/MINERALS CH
1.0000 | ORAL_TABLET | Freq: Every day | ORAL | Status: DC
  Administered 2024-05-23 – 2024-06-01 (×8): 1 via ORAL
  Filled 2024-05-22 (×11): qty 1

## 2024-05-22 MED ORDER — TRAVASOL 10 % IV SOLN
INTRAVENOUS | Status: AC
  Filled 2024-05-22: qty 940.8

## 2024-05-22 MED ORDER — APIXABAN 2.5 MG PO TABS
2.5000 mg | ORAL_TABLET | Freq: Two times a day (BID) | ORAL | Status: DC
  Administered 2024-05-22 – 2024-05-25 (×6): 2.5 mg via ORAL
  Filled 2024-05-22 (×8): qty 1

## 2024-05-22 MED ORDER — FUROSEMIDE 40 MG PO TABS
40.0000 mg | ORAL_TABLET | Freq: Once | ORAL | Status: AC
  Administered 2024-05-22: 40 mg via ORAL
  Filled 2024-05-22: qty 1

## 2024-05-22 MED ORDER — BOOST / RESOURCE BREEZE PO LIQD CUSTOM: 1.0000 | Freq: Three times a day (TID) | ORAL | Status: DC

## 2024-05-22 NOTE — Progress Notes (Signed)
 PHARMACY - TOTAL PARENTERAL NUTRITION CONSULT NOTE   Indication: Prolonged ileus  Patient Measurements: Height: 5' 5 (165.1 cm) Weight: 61.1 kg (134 lb 11.2 oz) IBW/kg (Calculated) : 57 TPN AdjBW (KG): 61.3 Body mass index is 22.42 kg/m.  Assessment: 62 yoF with history of diverticulitis with perforation and abscess. 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 She requested ostomy takedown and underwent LOA, colostomy takedown, small bowel resection with anastomosis on 5/29. Pharmacy is consulted to dose TPN starting 6/2 for postop ileus.  Now with anastomotic leak complicated by multiple-intraabdominal abscesses.  Glucose / Insulin : no Hx DM, A1c 5.1% - CBGs goal 100-150. BG 121 this AM - CBG check and SSI discontinued on 6/11 Electrolytes: All WNL, including CorrCa (9.8) Renal: SCr <1, BUN increased Hepatic: LFTs remains slightly elevated. Alk Phos elevated -Albumin  low. T.bili, TG WNL I/O: Increased PO intake, 1260 mL yesterday -UOP: 1100  mL, drains: 215 mL, stool x4 -No mIVF GI Imaging:  - 6/9 CT: [name] dehiscence along the rectal anastomosis, free air and multiple fluid collections GI Surgeries / Procedures: - 5/29: LAR, SBR, colostomy takedown, LOA - 6/10 drains placed x 2 in IR (midline abd, transgluteal)  Central access: Double lumen PICC placed 6/2 TPN start date: 6/2  Nutritional Goals: - Goal TPN rate is 70 mL/hr (provides 94 g of protein and 1680 kcals per day)  RD Assessment:  Estimated Needs Total Energy Estimated Needs: 1600-1750 kcals Total Protein Estimated Needs: 80-95 grams Total Fluid Estimated Needs: >/= 1.6L  Current Nutrition:  -TPN at goal -Diet advanced to full liquid -Pt refusing Boost supplementation, Magic cup TID per RD  Calorie count interrupted for drain placement.  Plan:  At 18:00 Continue TPN at goal rate of 70  mL/hr Electrolytes in TPN: No change Na 150 mEq/L (max) K 50 mEq/L Ca 5 mEq/L Mg 5 mEq/L Phos 15 mmol/L Cl:Ac 1:1 No CBG checks/SSI  Resume MVI PO Monitor TPN labs on Mon/Thurs, and as needed  Shireen Dory, PharmD 05/22/24 9:46 AM

## 2024-05-22 NOTE — Evaluation (Signed)
 Physical Therapy Re-Evaluation Patient Details Name: Elizabeth Odom MRN: [id number] DOB: [birth year] Today's Date: 05/22/2024  History of Present Illness  83 y.o. female s/p rectosigmoid resection takedown on 5/29. Course complicated by A-fib with RVR and then hypotension/rectal bleeding and syncope on 05/11/2024.  Patient also had postop ileus needing NG tube and TPN.  Imaging on 05/19/2024 showed possible abscesses.  She was started on IV antibiotics by general surgery on 05/20/2024.  She underwent IR guided drains placed on 05/20/2024.  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  Clinical Impression  Pt admitted 5/29 and has had complicated hospital course (as above).  Pt currently with functional limitations due to the deficits listed below (see PT Problem List). Pt will benefit from acute skilled PT to increase their independence and safety with mobility to allow discharge.  Pt requiring total assist to complete rolling and provide pericare.  Pt's mobility is significantly limited by pain (red periarea/buttocks) as well as fluid from rectum with bed mobility.  Due to patient's current assist level, generalized weakness and deconditioning from mostly remaining in bed this admission, d/c recommendations updated.  Patient will benefit from continued inpatient follow up therapy, <3 hours/day.  Despite pt's pain, current condition, and assist level, pt stating I just want to go home at end of session.  Pt repositioned with pillows under left side.          If plan is discharge home, recommend the following: Two people to help with walking and/or transfers;Two people to help with bathing/dressing/bathroom;Assistance with cooking/housework;Help with stairs or ramp for entrance;Assist for  transportation   Can travel by private vehicle        Equipment Recommendations None recommended by PT  Recommendations for Other Services       Functional Status Assessment Patient has had a recent decline in their functional status and demonstrates the ability to make significant improvements in function in a reasonable and predictable amount of time.     Precautions / Restrictions Precautions Precautions: Fall Precaution/Restrictions Comments: ABD surgery, gluteal JP and abdominal JP Restrictions Weight Bearing Restrictions Per Provider Order: No      Mobility  Bed Mobility Overal bed mobility: Needs Assistance Bed Mobility: Rolling Rolling: Total assist, +2 for physical assistance         General bed mobility comments: required total assist to complete roll; provided cues to self assist and reach for rail; required pericare due to increased fluid/drainage? from rectum upon rolling    Transfers                   General transfer comment: deferred due to pt continuing to leak fluid from rectum    Ambulation/Gait                  Stairs            Wheelchair Mobility     Tilt Bed    Modified Rankin (Stroke Patients Only)       Balance                                             Pertinent Vitals/Pain Pain Assessment Pain Assessment: Faces Faces Pain  Scale: Hurts whole lot Pain Location: abdomen with activity; periarea (very red buttocks/periarea - pt out of destin so applied barrier cream - RN present and aware) Pain Descriptors / Indicators: Grimacing, Constant, Discomfort Pain Intervention(s): Monitored during session    Home Living Family/patient expects to be discharged to:: Private residence Living Arrangements: Alone   Type of Home: Independent living facility Home Access: Level entry       Home Layout: One level Home Equipment: Cane - single Librarian, academic (2 wheels);Rollator (4 wheels)       Prior Function Prior Level of Function : Independent/Modified Independent;Driving                     Extremity/Trunk Assessment   Upper Extremity Assessment Upper Extremity Assessment: Generalized weakness    Lower Extremity Assessment Lower Extremity Assessment: Generalized weakness       Communication   Communication Communication: No apparent difficulties    Cognition Arousal: Alert Behavior During Therapy: Flat affect   PT - Cognitive impairments: Initiation, Attention, Sequencing                         Following commands: Impaired Following commands impaired: Follows one step commands with increased time     Cueing Cueing Techniques: Verbal cues, Tactile cues     General Comments      Exercises     Assessment/Plan    PT Assessment Patient needs continued PT services  PT Problem List Decreased strength;Decreased activity tolerance;Decreased balance;Decreased mobility;Decreased knowledge of use of DME;Pain;Decreased skin integrity       PT Treatment Interventions DME instruction;Gait training;Stair training;Therapeutic activities;Functional mobility training;Therapeutic exercise;Patient/family education;Balance training    PT Goals (Current goals can be found in the Care Plan section)  Acute Rehab PT Goals Patient Stated Goal: I just want to go home PT Goal Formulation: With patient Time For Goal Achievement: 06/05/24 Potential to Achieve Goals: Fair    Frequency Min 2X/week     Co-evaluation               AM-PAC PT 6 Clicks Mobility  Outcome Measure Help needed turning from your back to your side while in a flat bed without using bedrails?: A Lot Help needed moving from lying on your back to sitting on the side of a flat bed without using bedrails?: Total Help needed moving to and from a bed to a chair (including a wheelchair)?: Total Help needed standing up from a chair using your arms (e.g., wheelchair or bedside  chair)?: Total Help needed to walk in hospital room?: Total Help needed climbing 3-5 steps with a railing? : Total 6 Click Score: 7    End of Session   Activity Tolerance: Other (comment) (limited by fluid from rectum and pericare required) Patient left: in bed;with call bell/phone within reach;with bed alarm set Nurse Communication: Mobility status PT Visit Diagnosis: Muscle weakness (generalized) (M62.81);Difficulty in walking, not elsewhere classified (R26.2)    Time: 1610-9604 PT Time Calculation (min) (ACUTE ONLY): 22 min   Charges:   PT Evaluation $PT Re-evaluation: 1 Re-eval   PT General Charges $$ ACUTE PT VISIT: 1 Visit        Henretta Lodge PT, DPT Physical Therapist Acute Rehabilitation Services Office: [phone]   Myna Asal Payson 05/22/2024, 4:09 PM

## 2024-05-22 NOTE — Plan of Care (Signed)
  Problem: Education: Goal: Verbalization of understanding of the causes of altered bowel function will improve Outcome: Progressing   Problem: Bowel/Gastric: Goal: Gastrointestinal status for postoperative course will improve Outcome: Progressing   Problem: Health Behavior/Discharge Planning: Goal: Identification of community resources to assist with postoperative recovery needs will improve Outcome: Progressing   Problem: Nutritional: Goal: Will attain and maintain optimal nutritional status will improve Outcome: Progressing   Problem: Clinical Measurements: Goal: Postoperative complications will be avoided or minimized Outcome: Progressing   Problem: Respiratory: Goal: Respiratory status will improve Outcome: Progressing   Problem: Skin Integrity: Goal: Will show signs of wound healing Outcome: Progressing   Problem: Education: Goal: Knowledge of General Education information will improve Description: Including pain rating scale, medication(s)/side effects and non-pharmacologic comfort measures Outcome: Progressing   Problem: Health Behavior/Discharge Planning: Goal: Ability to manage health-related needs will improve Outcome: Progressing   Problem: Clinical Measurements: Goal: Ability to maintain clinical measurements within normal limits will improve Outcome: Progressing Goal: Will remain free from infection Outcome: Progressing Goal: Diagnostic test results will improve Outcome: Progressing Goal: Respiratory complications will improve Outcome: Progressing Goal: Cardiovascular complication will be avoided Outcome: Progressing   Problem: Activity: Goal: Risk for activity intolerance will decrease Outcome: Progressing   Problem: Coping: Goal: Level of anxiety will decrease Outcome: Progressing   Problem: Elimination: Goal: Will not experience complications related to bowel motility Outcome: Progressing Goal: Will not experience complications related to  urinary retention Outcome: Progressing   Problem: Pain Managment: Goal: General experience of comfort will improve and/or be controlled Outcome: Progressing   Problem: Safety: Goal: Ability to remain free from injury will improve Outcome: Progressing   Problem: Skin Integrity: Goal: Risk for impaired skin integrity will decrease Outcome: Progressing   Problem: Education: Goal: Ability to describe self-care measures that may prevent or decrease complications (Diabetes Survival Skills Education) will improve Outcome: Progressing Goal: Individualized Educational Video(s) Outcome: Progressing   Problem: Coping: Goal: Ability to adjust to condition or change in health will improve Outcome: Progressing   Problem: Fluid Volume: Goal: Ability to maintain a balanced intake and output will improve Outcome: Progressing   Problem: Health Behavior/Discharge Planning: Goal: Ability to identify and utilize available resources and services will improve Outcome: Progressing Goal: Ability to manage health-related needs will improve Outcome: Progressing   Problem: Metabolic: Goal: Ability to maintain appropriate glucose levels will improve Outcome: Progressing   Problem: Nutritional: Goal: Maintenance of adequate nutrition will improve Outcome: Progressing Goal: Progress toward achieving an optimal weight will improve Outcome: Progressing   Problem: Skin Integrity: Goal: Risk for impaired skin integrity will decrease Outcome: Progressing   Problem: Tissue Perfusion: Goal: Adequacy of tissue perfusion will improve Outcome: Progressing

## 2024-05-22 NOTE — Plan of Care (Signed)
  Problem: Coping: Goal: Level of anxiety will decrease Outcome: Progressing   Problem: Nutritional: Goal: Maintenance of adequate nutrition will improve Outcome: Progressing

## 2024-05-22 NOTE — Progress Notes (Signed)
 05/22/2024  Elizabeth Odom [id number] 06-06-1941  CARE TEAM: PCP: Anthon Kins, MD  Outpatient Care Team: Patient Care Team: Anthon Kins, MD as PCP - General (Internal Medicine) Cody Das, MD as PCP - Cardiology (Cardiology) Lajuan Pila, MD as Consulting Physician (Gastroenterology) Shela Derby, MD as Consulting Physician (General Surgery) Glory Larsen, MD (Neurology) Lahoma Pigg, MD as Consulting Physician (Urology) Sandee Crook Margart Shears, MD as Consulting Physician (Cardiology) Candyce Champagne, MD as Consulting Physician (Colon and Rectal Surgery)  Inpatient Treatment Team: Treatment Team:  Candyce Champagne, MD Ruel Cotta, Heinz Llano, MD Ozell Blunt, MD Vladimir Groves Radiology, MD Lexine Redder, MD Audria Leather, MD Annell Kidney, RN   Problem List:   Principal Problem:   Diverticular disease Active Problems:   A-fib [facility])   Restless leg   ABLA (acute blood loss anemia)   Need for emotional support   Acute urinary retention   Mixed hyperlipidemia   Essential hypertension   Diverticular disease of left colon   History of DVT (deep vein thrombosis)   History of pulmonary embolus (PE)   Pre-diabetes   Presence of IVC filter   Stricture of sigmoid colon (HCC)   Protein-calorie malnutrition, severe   Hypokalemia   05/08/2024  POST-OPERATIVE DIAGNOSIS:   COLOSTOMY FOR RESECTION, DESIRE FOR OSTOMY TAKEDOWN RECTAL STRICTURE HISTORY OF DIVERTICULITIS WITH PERFORATION & ABSCESS   PROCEDURE:   -ROBOTIC RECTOSIGMOID RESECTION (LAR) -TAKEDOWN OF END COLOSTOMY WITH ANASTOMOSIS -RESECTION OF SMALL INTESTINE WITH ANASTOMOSIS -SMALL BOWEL REPAIR -LYSIS OF ADHESIONS x 115 MINUTES (66% OF CASE),  -INTRAOPERATIVE ASSESSMENT OF TISSUE VASCULAR PERFUSION USING ICG (indocyanine green ) IMMUNOFLUORESCENCE,  -TRANSVERSUS ABDOMINIS PLANE (TAP) BLOCK - BILATERAL -FLEXIBLE SIGMOIDOSCOPY   SURGEON:  Eddye Goodie, MD  OR FINDINGS:  Very  dense adhesions of small bowel especially to pelvis and retroperitoneum.  Extremely concrete dense adhesions with friable tissue near her old prior small bowel anastomosis and distal jejunum.  Required small bowel repair and required small bowel resection including old anastomosis given severe tissues.  Patient had significant fibrotic stricturing of rectal stump at the mid/distal rectal junction.  Could not be released up.  Ended up doing resection of rectal stump to mid/distal rectal junction.  No obvious metastatic disease on visceral parietal peritoneum or liver.   It is a 29mm EEA anastomosis ( distal descending colon  connected to mid/distal rectal junction.)  It rests 7 cm from the anal verge by rigid proctoscopy.  05/20/2024  Post procedural Dx: Post op abscess, multiple   Technically successful CT guided placed of a  10 Fr drainage catheter placement x 2  into the midline ventral and dorsal perirectal abscesses.     Rosetta Cons, MD    Assessment [facility] Stay = 14 days) 14 Days Post-Op    Ileus resolving   Fluid collections raises suspicion of contained leak s/p drainage 6/10    Plan:  Drains placed 6/10 in abdomen and transgluteal.  Help appreciated.  Continue IV antibiotics.  Zosyn .  Add vancomycin  with history of MRSA.  I cannot find any cultures yet.  Send again.  Because the patient tolerated liquids and is hungry with bowel function and softer abdomen, will gradually advance diet since this seems to be a low volume controlled leak.  Imodium x 1 with her intermittent diarrhea.  Mental status markedly improved a guardedly hopeful sign.  TPN.  Consider calorie counts and start weaning off.  Partial urine retention status post Foley replacement 6/4.  Failed 72-hour removal.  Placed back in.  Started tamsulosin.  Will try to remove prior to discharge given pelvic fluid collections better drained.  Will need outpatient urology follow-up in 2 weeks  No rectal  tubes.  Atrial fibrillation postop.  History of a prior event.    Cardiology signed off for now.  Reconsult as needed.  Stop telemetry for now  Started DOAC Eliquis  6/4.  Recurrent lower GI bleeding most likely at staple line.  Held 6/7.  No more hematochezia.  Drains placed without hematochezia.  Restart and follow  Rate controlled switch from amlodipine  to metoprolol .  Mildly tachycardic but they feel okay as long as heart rate less than 120 and blood pressure okay.    Pain control.  Challenge in this patient with oversedation with most narcotics but seems better.  -monitor electrolytes & replace as needed.  Keep K>4, Mg>2, Phos>3  -VTE prophylaxis- SCDs.  Anticoagulation as appropriate  -mobilize as tolerated to help recovery.  Enlist therapies in moderate/high risk patients as appropriate.  Hopefully able to get up and move around more  I updated the patient's status to the patient.  Recommendations were made.  Questions were answered.  She expressed understanding & appreciation.  -Disposition: TBD      I reviewed last 24 h vitals and pain scores, last 48 h intake and output, last 24 h labs and trends, and last 24 h imaging results.  I have reviewed this patient's available data, including medical history, events of note, test results, etc as part of my evaluation.   A significant portion of that time was spent in counseling. Care during the described time interval was provided by me.  This care required moderate level of medical decision making.  05/22/2024    Subjective: (Chief complaint)  Patient getting a bath.  Tolerating liquids & no nausea/vomiting.  Hungry.  Sore but less abdominal pain overall.  Definitely feeling better and more optimistic today.  Appreciative of nursing care and my care as well.  Objective:  Vital signs:  Vitals:   05/21/24 0645 05/21/24 1312 05/21/24 2051 05/22/24 0558  BP: (!) 110/59 111/74 (!) 111/56 115/71  Pulse: 94 (!) 54 (!)  101 91  Resp: 18 16 17 17   Temp: 98.3 F (36.8 C) 98 F (36.7 C) 98.5 F (36.9 C) 98.4 F (36.9 C)  TempSrc: Oral Oral Oral Oral  SpO2: 97% 99% 99% 99%  Weight:      Height:        Last BM Date : 05/21/24  Intake/Output   Yesterday:  06/11 0701 - 06/12 0700 In: 3066 [P.O.:1260; I.V.:1653.1; IV Piggyback:142.9] Out: 1315 [Urine:1100; Drains:215] This shift:  Total I/O In: 1481.7 [P.O.:300; I.V.:1131.7; IV Piggyback:49.9] Out: 625 [Urine:600; Drains:25]  Bowel function:  Flatus: YES  BM:  YES  Drain:  Right lower quadrant drain: Scant purulent  Transgluteal drain: thin serosanguineous with brown tinge   Physical Exam:  General: Resting but easily awakens.  Less mumbling.  Talking.  Asking somewhat repeated questions but more directable today and less confused.  .    Eyes: PERRL, normal EOM.  Sclera clear.  No icterus Neuro: CN II-XII intact w/o focal sensory/motor deficits.  All extremities.  No facial droop. Lymph: No head/neck/groin lymphadenopathy Psych:  No delerium/psychosis/paranoia.  Oriented x 4 HENT: Normocephalic, Mucus membranes moist.  No thrush Neck: Supple, No tracheal deviation.  No obvious thyromegaly.  Prefers to sit with head flexed and chin down.  Does not want to have  her neck back.  Pillow adjusted. Chest: No pain to chest wall compression.  Good respiratory excursion.  No audible wheezing CV:  Pulses intact.  Regular rhythm.  No major extremity edema MS: Normal AROM mjr joints.  No obvious deformity  Abdomen:  Soft.  Moderately distended.  Nontender.  No guarding.  No complaints of pain to percussion or benching or cough.  No evidence of peritonitis. Old ostomy incision with normal healing ridge.    GU: Foley in place with clear light yellow urine Rectal: deferred today  Ext:   No deformity.  No mjr edema.  No cyanosis Skin: No petechiae / purpurea.  No major sores.  Warm and dry    Results:   Cultures: Recent Results (from the past  720 hours)  Urine Culture     Status: Abnormal   Collection Time: 05/09/24  8:30 PM   Specimen: Urine, Clean Catch  Result Value Ref Range Status   Specimen Description   Final    URINE, CLEAN CATCH Performed at [facility], [address], [place], [place] [address]    Special Requests   Final    NONE Performed at [facility], [address], [place], [place] [address]    Culture (A)  Final    <10,000 COLONIES/mL INSIGNIFICANT GROWTH Performed at [facility], [address] [place], [place] [address]    Report Status 05/11/2024 FINAL  Final    Labs: Results for orders placed or performed during the hospital encounter of 05/08/24 (from the past 48 hours)  Glucose, capillary     Status: Abnormal   Collection Time: 05/20/24  7:49 AM  Result Value Ref Range   Glucose-Capillary 131 (H) 70 - 99 mg/dL    Comment: Glucose reference range applies only to samples taken after fasting for at least 8 hours.  Glucose, capillary     Status: Abnormal   Collection Time: 05/20/24  6:16 PM  Result Value Ref Range   Glucose-Capillary 130 (H) 70 - 99 mg/dL    Comment: Glucose reference range applies only to samples taken after fasting for at least 8 hours.  Glucose, capillary     Status: Abnormal   Collection Time: 05/21/24  2:09 AM  Result Value Ref Range   Glucose-Capillary 144 (H) 70 - 99 mg/dL    Comment: Glucose reference range applies only to samples taken after fasting for at least 8 hours.  CBC     Status: Abnormal   Collection Time: 05/21/24  3:31 AM  Result Value Ref Range   WBC 9.2 4.0 - 10.5 K/uL   RBC 3.41 (L) 3.87 - 5.11 MIL/uL   Hemoglobin 9.7 (L) 12.0 - 15.0 g/dL   HCT 62.1 (L) 30.8 - 65.7 %   MCV 92.1 80.0 - 100.0 fL   MCH 28.4 26.0 - 34.0 pg   MCHC 30.9 30.0 - 36.0 g/dL   RDW 84.6 (H) 96.2 - 95.2 %   Platelets 600 (H) 150 - 400 K/uL   nRBC 0.0 0.0 - 0.2 %    Comment: Performed at [facility], [address], [place], [place] [address]  Basic metabolic panel with GFR     Status: Abnormal   Collection Time: 05/21/24  3:31 AM  Result Value Ref Range   Sodium 136 135 - 145 mmol/L   Potassium 4.2 3.5 - 5.1 mmol/L   Chloride 104 98 - 111 mmol/L   CO2 25 22 - 32  mmol/L   Glucose, Bld 131 (H) 70 - 99 mg/dL    Comment: Glucose reference range applies only to samples taken after fasting for at least 8 hours.   BUN 15 8 - 23 mg/dL   Creatinine, Ser 4.09 0.44 - 1.00 mg/dL   Calcium  7.9 (L) 8.9 - 10.3 mg/dL   GFR, Estimated >81 >19 mL/min    Comment: (NOTE) Calculated using the CKD-EPI Creatinine Equation (2021)    Anion gap 7 5 - 15    Comment: Performed at [facility], [address], [place], [place] [address]  Glucose, capillary     Status: Abnormal   Collection Time: 05/21/24  7:54 AM  Result Value Ref Range   Glucose-Capillary 164 (H) 70 - 99 mg/dL    Comment: Glucose reference range applies only to samples taken after fasting for at least 8 hours.  Glucose, capillary     Status: Abnormal   Collection Time: 05/21/24  4:15 PM  Result Value Ref Range   Glucose-Capillary 134 (H) 70 - 99 mg/dL    Comment: Glucose reference range applies only to samples taken after fasting for at least 8 hours.  Comprehensive metabolic panel     Status: Abnormal   Collection Time: 05/22/24  4:07 AM  Result Value Ref Range   Sodium 136 135 - 145 mmol/L   Potassium 4.1 3.5 - 5.1 mmol/L   Chloride 104 98 - 111 mmol/L   CO2 24 22 - 32 mmol/L   Glucose, Bld 121 (H) 70 - 99 mg/dL    Comment: Glucose reference range applies only to samples taken after fasting for at least 8 hours.   BUN 29 (H) 8 - 23 mg/dL   Creatinine, Ser 9.56 0.44 - 1.00 mg/dL   Calcium  7.8 (L) 8.9 - 10.3 mg/dL   Total Protein 6.0 (L) 6.5 - 8.1 g/dL   Albumin  <1.5 (L) 3.5 - 5.0 g/dL   AST 42 (H) 15 - 41 U/L   ALT 50 (H) 0 - 44 U/L   Alkaline Phosphatase 265 (H) 38 - 126 U/L   Total Bilirubin 0.8 0.0 - 1.2 mg/dL    GFR, Estimated >21 >30 mL/min    Comment: (NOTE) Calculated using the CKD-EPI Creatinine Equation (2021)    Anion gap 8 5 - 15    Comment: Performed at [facility], [address] [place], [place] [address]  Magnesium      Status: None   Collection Time: 05/22/24  4:07 AM  Result Value Ref Range   Magnesium  2.0 1.7 - 2.4 mg/dL    Comment: Performed at [facility], [address], [place], [place] [address]  Phosphorus     Status: None   Collection Time: 05/22/24  4:07 AM  Result Value Ref Range   Phosphorus 3.8 2.5 - 4.6 mg/dL    Comment: Performed at [facility], [address], [place], [place] [address]  CBC     Status: Abnormal   Collection Time: 05/22/24  4:07 AM  Result Value Ref Range   WBC 7.9 4.0 - 10.5 K/uL   RBC 3.25 (L) 3.87 - 5.11 MIL/uL   Hemoglobin 9.4 (L) 12.0 - 15.0 g/dL   HCT 13.2 (L) 44.0 - 10.2 %   MCV 94.2 80.0 - 100.0 fL   MCH 28.9 26.0 - 34.0 pg   MCHC 30.7 30.0 - 36.0 g/dL   RDW 72.5 (H) 36.6 - 44.0 %   Platelets 635 (H)  150 - 400 K/uL   nRBC 0.0 0.0 - 0.2 %    Comment: Performed at [facility], [address], [place], [place] [address]    Imaging / Studies: CT GUIDED PERITONEAL/RETROPERITONEAL FLUID DRAIN BY [facility] CATH Result Date: 05/21/2024 INDICATION: Multiple pelvic and abdominal postoperative abscesses. Planned placement of percutaneous drainage catheters in the 2 largest collections. EXAM: CT-guided drain placement x2 TECHNIQUE: Multidetector CT imaging of the pelvis was performed following the standard protocol without IV contrast. RADIATION DOSE REDUCTION: This exam was performed according to the departmental dose-optimization program which includes automated exposure control, adjustment of the mA and/or kV according to patient size and/or use of iterative reconstruction technique. MEDICATIONS: The patient is currently admitted to the hospital and receiving intravenous  antibiotics. The antibiotics were administered within an appropriate time frame prior to the initiation of the procedure. ANESTHESIA/SEDATION: Moderate (conscious) sedation was employed during this procedure. A total of Versed  1.5 mg and Fentanyl  75 mcg was administered intravenously by the radiology nurse. Total intra-service moderate Sedation Time: 58 minutes. The patient's level of consciousness and vital signs were monitored continuously by radiology nursing throughout the procedure under my direct supervision. COMPLICATIONS: None immediate. PROCEDURE: Informed written consent was obtained from the patient after a thorough discussion of the procedural risks, benefits and alternatives. All questions were addressed. Maximal Sterile Barrier Technique was utilized including caps, mask, sterile gowns, sterile gloves, sterile drape, hand hygiene and skin antiseptic. A timeout was performed prior to the initiation of the procedure. With the patient in a supine position, radiopaque markers were placed near the umbilicus and initial images of the pelvis were obtained. The patient's skin was then marked, prepped, and draped in the usual sterile fashion. Local anesthesia was achieved by infiltrating subcutaneous tissue with 1% lidocaine . A small incision was made in the right lower quadrant and a 7 cm Yueh needle was advanced and repeat imaging demonstrated the needle to be within the abscess. The needle was withdrawn leaving the cannula in position for an Amplatz wire to be advanced into the collection. The access site was then dilated using a 10 [place] dilator. A 10 French drainage catheter was then advanced over the guidewire and placed in the collection. Retention suture, sterile dressing, and JP bulb for applied. The patient was then rotated to a prone position with radiopaque markers placed on the gluteal region. Repeat imaging was then used to try angulate location. The patient's skin was prepped and draped in usual  sterile fashion. Local infiltration with 1% lidocaine  was used to anesthetize the region. A small incision was then made and again a 7 cm Yueh needle was advanced from the skin into the collection. The needle was withdrawn and an Amplatz wire was advanced through the cannula. Repeat imaging demonstrated satisfactory position. The access site was dilated using a 10 French fascial dilator. A 10 French drainage catheter was then advanced over the guidewire and locked in position. Retention suture and sterile dressing applied. The catheter was connected to a JP bulb. IMPRESSION: Satisfactory placement of 210 French drainage catheters and the largest pelvic abscess collections in this patient. Ventral near the umbilicus and transgluteal. Electronically Signed   By: Susan Ensign   On: 05/21/2024 10:00     Medications / Allergies: per chart  Antibiotics: Anti-infectives (From admission, onward)    Start     Dose/Rate Route Frequency Ordered Stop   05/22/24 0800  vancomycin  (VANCOCIN ) IVPB 1000 mg/200 mL premix  1,000 mg 200 mL/hr over 60 Minutes Intravenous Every 24 hours 05/21/24 0634     05/21/24 0730  vancomycin  (VANCOREADY) IVPB 1250 mg/250 mL        1,250 mg 166.7 mL/hr over 90 Minutes Intravenous  Once 05/21/24 0634 05/21/24 1043   05/19/24 1400  piperacillin -tazobactam (ZOSYN ) IVPB 3.375 g        3.375 g 12.5 mL/hr over 240 Minutes Intravenous Every 8 hours 05/19/24 1303 05/24/24 1359   05/09/24 0900  erythromycin  250 mg in sodium chloride  0.9 % 100 mL IVPB        250 mg 100 mL/hr over 60 Minutes Intravenous Every 8 hours 05/09/24 0732 05/11/24 0044   05/08/24 2200  cefoTEtan  (CEFOTAN ) 2 g in sodium chloride  0.9 % 100 mL IVPB        2 g 200 mL/hr over 30 Minutes Intravenous Every 12 hours 05/08/24 1759 05/09/24 0749   05/08/24 1400  neomycin  (MYCIFRADIN ) tablet 1,000 mg  Status:  Discontinued       Placed in And Linked Group   1,000 mg Oral 3 times per day 05/08/24 1119  05/08/24 1120   05/08/24 1400  metroNIDAZOLE  (FLAGYL ) tablet 1,000 mg  Status:  Discontinued       Placed in And Linked Group   1,000 mg Oral 3 times per day 05/08/24 1119 05/08/24 1120   05/08/24 1130  cefoTEtan  (CEFOTAN ) 2 g in sodium chloride  0.9 % 100 mL IVPB        2 g 200 mL/hr over 30 Minutes Intravenous On call to O.R. 05/08/24 1119 05/09/24 0749         Note: Portions of this report may have been transcribed using voice recognition software. Every effort was made to ensure accuracy; however, inadvertent computerized transcription errors may be present.   Any transcriptional errors that result from this process are unintentional.    Eddye Goodie, MD, FACS, MASCRS Esophageal, Gastrointestinal & Colorectal Surgery Robotic and Minimally Invasive Surgery  [facility] [address] [place], [place] [address] [phone] Fax [phone] Main  CONTACT INFORMATION: Weekday (9AM-5PM): Call [facility] main office at [phone] Weeknight (5PM-9AM) or Weekend/Holiday: Check EPIC Web Links tab & use AMION (password  [email or web address]) for General Surgery [facility] coverage  Please, DO NOT use SecureChat  (it is not reliable communication to reach operating surgeons & will lead to a delay in care).   Epic staff messaging available for outptient concerns needing 1-2 business day response.      05/22/2024  6:51 AM

## 2024-05-22 NOTE — Progress Notes (Signed)
 PROGRESS NOTE    Elizabeth Odom  WUJ:[id number] DOB: 21-Oct-1941 DOA: 05/08/2024 PCP: Anthon Kins, MD   Brief Narrative:  83 year old female with past medical history significant for remote history of A-fib evaluated with a loop recorder which was negative, prior DVT and small PE currently not on any anticoagulation because of some history of bleeding in the past had IVC filter which was subsequently removed at some point), anemia, PACs, HTN, mild cognitive impairment,  large bowel obstruction requiring colon resection and colostomy was admitted for colostomy takedown by general surgery.  Patient underwent elective robotic rectosigmoid resection, colostomy takedown with bowel anastomosis, extensive lysis of additions on 05/08/24 after preop cardiac clearance. On 05/09/24 evening, patient developed AMS after polypharmacy along with A-fib with RVR and TRH was consulted. Patient was transferred to stepdown unit. Course complicated by A-fib with RVR: Cardiology was consulted.  Subsequently, rate got controlled.  Cardiology signed off on 05/19/2024.  Eliquis  on hold.  Hospitalization also complicated by hypotension/rectal bleeding and syncope on 05/11/2024.  Patient also had postop ileus needing NG tube and TPN.  Imaging on 05/19/2024 showed possible abscesses.  She was started on IV antibiotics by general surgery on 05/20/2024.  She underwent IR guided drains placed on 05/20/2024.  Assessment & Plan:   Persistent A-fib/flutter - Still has mild intermittent tachycardia.  Continue metoprolol  succinate.  Cardiology signed off on 05/19/2024.  Recommended to restart Eliquis  when okay by surgery.  Outpatient follow-up with cardiology  Status post elective robotic rectosigmoid resection, colostomy takedown with bowel anastomosis, extensive lysis of additions on 05/08/24 with hospital course complicated by ileus and now possible intra-abdominal abscesses - Management as per primary surgical team.  Currently still getting  TPN. -Imaging on 05/19/2024 showed possible abscesses.  She was started on IV antibiotics by general surgery on 05/20/2024.  She underwent IR guided drains placed on 05/20/2024.  Acute blood loss anemia - Status post 4 units packed red cell transfusion.  Hemoglobin 9.4 today.  Monitor intermittently.  Acute kidney injury - Resolved  Hypoalbuminemia - Continue TPN  Leukocytosis - Resolved  Thrombocytosis - Possibly reactive.  Monitor intermittently  Hyponatremia -Improved  Syncope - Had syncope on 05/11/2024 1 patient was on bedside commode.  Possibly due to hypotension or vasovagal.  Echo with preserved LVEF.  No further syncope since then  Urinary retention--failed voiding trial.  Continue Flomax.  Continues to have Foley catheter.  Will need outpatient follow-up with urology  Acute metabolic encephalopathy/delirium - Resolved.  Monitor mental status.  Fall precautions.  Minimize opiates and sedatives  Elevated LFTs -Improving.  Monitor intermittently   Subjective: Patient seen and examined at bedside.  Continues to feel weak.  Abdominal pain is improving.  No fever, worsening shortness of breath or vomiting reported.   Objective: Vitals:   05/21/24 0645 05/21/24 1312 05/21/24 2051 05/22/24 0558  BP: (!) 110/59 111/74 (!) 111/56 115/71  Pulse: 94 (!) 54 (!) 101 91  Resp: 18 16 17 17   Temp: 98.3 F (36.8 C) 98 F (36.7 C) 98.5 F (36.9 C) 98.4 F (36.9 C)  TempSrc: Oral Oral Oral Oral  SpO2: 97% 99% 99% 99%  Weight:      Height:        Intake/Output Summary (Last 24 hours) at 05/22/2024 0750 Last data filed at 05/22/2024 0601 Gross per 24 hour  Intake 3066.03 ml  Output 1315 ml  Net 1751.03 ml   Filed Weights   05/18/24 0500 05/20/24 0500 05/21/24 0500  Weight:  61 kg 61.2 kg 61.1 kg    Examination:  General: On room air.  No distress.  Chronically ill and deconditioned looking. ENT/neck: No thyromegaly.  JVD is not elevated  respiratory: Decreased breath  sounds at bases bilaterally with some crackles; no wheezing  CVS: S1-S2 heard, tachycardic  abdominal: Soft, mild tender, slightly distended; no organomegaly, normal bowel sounds are heard.  Abdominal drains are present Extremities: Trace lower extremity edema; no cyanosis  CNS: Awake and alert.  Still slow to respond and a poor historian.  No focal neurologic deficit.  Moves extremities Lymph: No obvious lymphadenopathy Skin: No obvious ecchymosis/lesions  psych: Mostly flat affect with no signs of agitation.   Musculoskeletal: No obvious joint swelling/deformity     Data Reviewed: I have personally reviewed following labs and imaging studies  CBC: Recent Labs  Lab 05/18/24 1831 05/19/24 1326 05/20/24 0350 05/21/24 0331 05/22/24 0407  WBC 14.8* 13.8* 13.8* 9.2 7.9  HGB 10.0* 9.7* 9.7* 9.7* 9.4*  HCT 30.2* 30.2* 30.6* 31.4* 30.6*  MCV 88.3 89.9 92.4 92.1 94.2  PLT 473* 491* 541* 600* 635*   Basic Metabolic Panel: Recent Labs  Lab 05/17/24 0008 05/18/24 0318 05/19/24 0337 05/20/24 0350 05/21/24 0331 05/22/24 0407  NA 133* 134* 132* 134* 136 136  K 3.8 4.0 4.3 4.1 4.2 4.1  CL 98 101 99 101 104 104  CO2 25 24 24 24 25 24   GLUCOSE 134* 150* 124* 129* 131* 121*  BUN 46* 25* 22 23 15  29*  CREATININE 0.99 0.59 0.63 0.65 0.46 0.59  CALCIUM  7.3* 7.6* 7.5* 7.7* 7.9* 7.8*  MG 2.0 2.0 2.1  --   --  2.0  PHOS 3.4 2.5 3.5  --   --  3.8   GFR: Estimated Creatinine Clearance: 47.9 mL/min (by C-G formula based on SCr of 0.59 mg/dL). Liver Function Tests: Recent Labs  Lab 05/19/24 0337 05/20/24 0350 05/22/24 0407  AST 54* 32 42*  ALT 65* 47* 50*  ALKPHOS 245* 269* 265*  BILITOT 1.4* 1.2 0.8  PROT 5.5* 5.8* 6.0*  ALBUMIN  <1.5* <1.5* <1.5*   No results for input(s): LIPASE, AMYLASE in the last 168 hours. No results for input(s): AMMONIA in the last 168 hours. Coagulation Profile: Recent Labs  Lab 05/19/24 2054  INR 1.2   Cardiac Enzymes: No results for  input(s): CKTOTAL, CKMB, CKMBINDEX, TROPONINI in the last 168 hours. BNP (last 3 results) No results for input(s): PROBNP in the last 8760 hours. HbA1C: No results for input(s): HGBA1C in the last 72 hours. CBG: Recent Labs  Lab 05/20/24 0749 05/20/24 1816 05/21/24 0209 05/21/24 0754 05/21/24 1615  GLUCAP 131* 130* 144* 164* 134*   Lipid Profile: No results for input(s): CHOL, HDL, LDLCALC, TRIG, CHOLHDL, LDLDIRECT in the last 72 hours.  Thyroid  Function Tests: No results for input(s): TSH, T4TOTAL, FREET4, T3FREE, THYROIDAB in the last 72 hours. Anemia Panel: No results for input(s): VITAMINB12, FOLATE, FERRITIN, TIBC, IRON, RETICCTPCT in the last 72 hours. Sepsis Labs: No results for input(s): PROCALCITON, LATICACIDVEN in the last 168 hours.  No results found for this or any previous visit (from the past 240 hours).       Radiology Studies: CT GUIDED PERITONEAL/RETROPERITONEAL FLUID DRAIN BY [facility] CATH Result Date: 05/21/2024 INDICATION: Multiple pelvic and abdominal postoperative abscesses. Planned placement of percutaneous drainage catheters in the 2 largest collections. EXAM: CT-guided drain placement x2 TECHNIQUE: Multidetector CT imaging of the pelvis was performed following the standard protocol without IV contrast. RADIATION DOSE  REDUCTION: This exam was performed according to the departmental dose-optimization program which includes automated exposure control, adjustment of the mA and/or kV according to patient size and/or use of iterative reconstruction technique. MEDICATIONS: The patient is currently admitted to the hospital and receiving intravenous antibiotics. The antibiotics were administered within an appropriate time frame prior to the initiation of the procedure. ANESTHESIA/SEDATION: Moderate (conscious) sedation was employed during this procedure. A total of Versed  1.5 mg and Fentanyl  75 mcg was administered  intravenously by the radiology nurse. Total intra-service moderate Sedation Time: 58 minutes. The patient's level of consciousness and vital signs were monitored continuously by radiology nursing throughout the procedure under my direct supervision. COMPLICATIONS: None immediate. PROCEDURE: Informed written consent was obtained from the patient after a thorough discussion of the procedural risks, benefits and alternatives. All questions were addressed. Maximal Sterile Barrier Technique was utilized including caps, mask, sterile gowns, sterile gloves, sterile drape, hand hygiene and skin antiseptic. A timeout was performed prior to the initiation of the procedure. With the patient in a supine position, radiopaque markers were placed near the umbilicus and initial images of the pelvis were obtained. The patient's skin was then marked, prepped, and draped in the usual sterile fashion. Local anesthesia was achieved by infiltrating subcutaneous tissue with 1% lidocaine . A small incision was made in the right lower quadrant and a 7 cm Yueh needle was advanced and repeat imaging demonstrated the needle to be within the abscess. The needle was withdrawn leaving the cannula in position for an Amplatz wire to be advanced into the collection. The access site was then dilated using a 10 [place] dilator. A 10 French drainage catheter was then advanced over the guidewire and placed in the collection. Retention suture, sterile dressing, and JP bulb for applied. The patient was then rotated to a prone position with radiopaque markers placed on the gluteal region. Repeat imaging was then used to try angulate location. The patient's skin was prepped and draped in usual sterile fashion. Local infiltration with 1% lidocaine  was used to anesthetize the region. A small incision was then made and again a 7 cm Yueh needle was advanced from the skin into the collection. The needle was withdrawn and an Amplatz wire was advanced through the  cannula. Repeat imaging demonstrated satisfactory position. The access site was dilated using a 10 French fascial dilator. A 10 French drainage catheter was then advanced over the guidewire and locked in position. Retention suture and sterile dressing applied. The catheter was connected to a JP bulb. IMPRESSION: Satisfactory placement of 210 French drainage catheters and the largest pelvic abscess collections in this patient. Ventral near the umbilicus and transgluteal. Electronically Signed   By: Susan Ensign   On: 05/21/2024 10:00        Scheduled Meds:  acetaminophen   500 mg Oral TID WC & HS   ascorbic acid   250 mg Oral Daily   Chlorhexidine  Gluconate Cloth  6 each Topical Daily   cyanocobalamin   1,000 mcg Oral Daily   feeding supplement  1 Container Oral TID BM   liver oil-zinc  oxide   Topical BID   loperamide  2 mg Oral QHS   metoprolol  succinate  25 mg Oral Daily   pantoprazole   40 mg Oral Daily   polycarbophil  625 mg Oral Daily   sodium chloride  flush  10-40 mL Intracatheter Q12H   sodium chloride  flush  3 mL Intravenous Q12H   sodium chloride  flush  5 mL Intracatheter Q8H   tamsulosin  0.4 mg Oral QPC supper   Continuous Infusions:  sodium chloride      piperacillin -tazobactam (ZOSYN )  IV 3.375 g (05/22/24 0601)   TPN ADULT (ION) 70 mL/hr at 05/21/24 1706   vancomycin             Giovanie Lefebre Maury Space, MD [facility] 05/22/2024, 7:50 AM

## 2024-05-23 DIAGNOSIS — K5790 Diverticulosis of intestine, part unspecified, without perforation or abscess without bleeding: Secondary | ICD-10-CM | POA: Diagnosis not present

## 2024-05-23 LAB — CREATININE, SERUM
Creatinine, Ser: 0.69 mg/dL (ref 0.44–1.00)
GFR, Estimated: >60 mL/min (ref >60)

## 2024-05-23 MED ORDER — PIPERACILLIN-TAZOBACTAM 3.375 G IVPB
3.3750 g | Freq: Three times a day (TID) | INTRAVENOUS | Status: DC
  Administered 2024-05-23 – 2024-06-04 (×33): 3.375 g via INTRAVENOUS
  Filled 2024-05-23 (×37): qty 50

## 2024-05-23 MED ORDER — LOPERAMIDE HCL 2 MG PO CAPS
2.0000 mg | ORAL_CAPSULE | Freq: Two times a day (BID) | ORAL | Status: DC
  Administered 2024-05-23 – 2024-05-25 (×4): 2 mg via ORAL
  Filled 2024-05-23 (×6): qty 1

## 2024-05-23 MED ORDER — CYCLOBENZAPRINE HCL 5 MG PO TABS
5.0000 mg | ORAL_TABLET | Freq: Three times a day (TID) | ORAL | Status: DC | PRN
  Administered 2024-05-28 (×2): 5 mg via ORAL
  Filled 2024-05-23 (×2): qty 1

## 2024-05-23 MED ORDER — SODIUM CHLORIDE 0.9 % IV SOLN
100.0000 mg | INTRAVENOUS | Status: AC
  Administered 2024-05-23 – 2024-06-05 (×13): 100 mg via INTRAVENOUS
  Filled 2024-05-23 (×14): qty 5

## 2024-05-23 MED ORDER — TRAVASOL 10 % IV SOLN
INTRAVENOUS | Status: AC
  Filled 2024-05-23: qty 940.8

## 2024-05-23 MED ORDER — GABAPENTIN 100 MG PO CAPS
300.0000 mg | ORAL_CAPSULE | Freq: Every day | ORAL | Status: AC
  Administered 2024-05-23 – 2024-05-25 (×2): 300 mg via ORAL
  Filled 2024-05-23 (×3): qty 3

## 2024-05-23 MED ORDER — METOPROLOL SUCCINATE ER 25 MG PO TB24
12.5000 mg | ORAL_TABLET | Freq: Every day | ORAL | Status: DC
  Administered 2024-05-25 – 2024-06-02 (×9): 12.5 mg via ORAL
  Filled 2024-05-23 (×11): qty 1

## 2024-05-23 MED ORDER — TRAMADOL HCL 50 MG PO TABS
50.0000 mg | ORAL_TABLET | Freq: Four times a day (QID) | ORAL | Status: DC | PRN
  Administered 2024-05-23 – 2024-06-01 (×6): 100 mg via ORAL
  Filled 2024-05-23 (×7): qty 2

## 2024-05-23 NOTE — Progress Notes (Signed)
 PROGRESS NOTE    Elizabeth Odom  URK:[id number] DOB: 06-Jun-1941 DOA: 05/08/2024 PCP: Anthon Kins, MD    Hospital course:  83 year old female with past medical history significant for remote history of A-fib evaluated with a loop recorder which was negative, prior DVT and small PE currently not on any anticoagulation because of some history of bleeding in the past had IVC filter which was subsequently removed at some point), anemia, PACs, HTN, mild cognitive impairment,  large bowel obstruction requiring colon resection and colostomy was admitted for colostomy takedown by general surgery.  Patient underwent elective robotic rectosigmoid resection, colostomy takedown with bowel anastomosis, extensive lysis of additions on 05/08/24 after preop cardiac clearance. On 05/09/24 evening, patient developed AMS after polypharmacy along with A-fib with RVR and TRH was consulted. Patient was transferred to stepdown unit. Course complicated by A-fib with RVR: Cardiology was consulted.  Subsequently, rate got controlled.  Cardiology signed off on 05/19/2024.  Eliquis  on hold.  Hospitalization also complicated by hypotension/rectal bleeding and syncope on 05/11/2024.  Patient also had postop ileus needing NG tube and TPN.  Imaging on 05/19/2024 showed possible abscesses.  She was started on IV antibiotics by general surgery on 05/20/2024.  She underwent IR guided drains placed on 05/20/2024.   Subjective:  Patient's main concern today is that she not be sent to a rehab facility.  Notes that she went to rehab before and she hated it, was not treated well.  She is insistent that she needs to go home once time for her to go home.  Notes that her granddaughter is looking into possibility of home health care.  Exam:  General: Tired appearing thin female sitting up in recliner in no respiratory distress Eyes: sclera anicteric, conjuctiva mild injection bilaterally CVS: S1-S2, regular  Respiratory:  decreased air entry  bilaterally secondary to decreased inspiratory effort, rales at bases  GI: NABS, soft, NT  LE: No edema  Assessment & Plan:  Ileus Possible intra-abdominal abscess S/p elective rectosigmoid resection with bowel anastomosis/lysis of adhesions Severe protein calorie malnutrition with albumin  < 1.5 Patient is followed closely by general surgery Ileus appears to be resolving, patient advance to bland soft solids--she continues on TPN, this is to be tapered when she is adequately able to maintain p.o.'s, nutrition is following. Patient is on vancomycin /Zosyn  General surgery added micafungin as cultures are showing rare yeast  Urinary retention Foley catheter Voiding trial per general surgery Previously failed voiding trial, continue Flomax   Persistent A-fib Continue Toprol -XL Cardiology signed off on 6/9, outpatient follow-up recommended Eliquis  restarted 6/12  ABLA H&H have been stable for several days post transfusion with 4 units PRBC  S/p syncope 6/1, possibly micturition syncope While patient was on bedside commode Echo with preserved EF  Acute metabolic encephalopathy--resolved    DVT prophylaxis: SCD Code Status: Full Family Communication: Attempted to speak with with granddaughter Disposition: TBD      Pressure Injury 10/08/23 Buttocks Right;Mid Stage 2 -  Partial thickness loss of dermis presenting as a shallow open injury with a red, pink wound bed without slough. (Active)  10/08/23 0800  Location: Buttocks  Location Orientation: Right;Mid  Staging: Stage 2 -  Partial thickness loss of dermis presenting as a shallow open injury with a red, pink wound bed without slough.  Wound Description (Comments):   DO NOT USE:  Present on Admission:      Diet Orders (From admission, onward)     Start     Ordered   05/23/24  0865  Diet regular Room service appropriate? Yes; Fluid consistency: Thin  Diet effective now       Comments: If patient experiences nausea,  vomiting, worsening pain, or worsening distention; then, make NPO with sips/ice chips only until seen by MD  Question Answer Comment  Room service appropriate? Yes   Fluid consistency: Thin      05/23/24 0753   05/08/24 0000  Diet - low sodium heart healthy       Comments: Start with a bland diet such as soups, liquids, starchy foods, low fat foods, etc. the first few days at home. Gradually advance to a solid, low-fat, high fiber diet by the end of the first week at home.   Add a fiber supplement to your diet (Metamucil, etc) If you feel full, bloated, or constipated, stay on a full liquid or pureed/blenderized diet for a few days until you feel better and are no longer constipated.   05/08/24 1149            Objective: Vitals:   05/22/24 1410 05/22/24 2116 05/23/24 0623 05/23/24 1345  BP: 122/76 (!) 94/49 109/65 (!) 100/56  Pulse: 88 90 88 78  Resp: 18 20 16 18   Temp: 98.7 F (37.1 C) 98.5 F (36.9 C) 97.9 F (36.6 C) 98.2 F (36.8 C)  TempSrc: Oral Oral  Oral  SpO2: 97% 97% 98% 98%  Weight:      Height:        Intake/Output Summary (Last 24 hours) at 05/23/2024 1726 Last data filed at 05/23/2024 1400 Gross per 24 hour  Intake 2403.34 ml  Output 830 ml  Net 1573.34 ml   Filed Weights   05/18/24 0500 05/20/24 0500 05/21/24 0500  Weight: 61 kg 61.2 kg 61.1 kg    Scheduled Meds:  acetaminophen   500 mg Oral TID WC & HS   apixaban   2.5 mg Oral BID   ascorbic acid   250 mg Oral Daily   Chlorhexidine  Gluconate Cloth  6 each Topical Daily   cyanocobalamin   1,000 mcg Oral Daily   feeding supplement  1 Container Oral TID BM   gabapentin   300 mg Oral QHS   liver oil-zinc  oxide   Topical BID   loperamide   2 mg Oral BID   metoprolol  succinate  12.5 mg Oral Daily   multivitamin with minerals  1 tablet Oral Daily   pantoprazole   40 mg Oral Daily   polycarbophil  625 mg Oral Daily   sodium chloride  flush  10-40 mL Intracatheter Q12H   sodium chloride  flush  3 mL  Intravenous Q12H   sodium chloride  flush  5 mL Intracatheter Q8H   tamsulosin   0.4 mg Oral QPC supper   Continuous Infusions:  sodium chloride      micafungin (MYCAMINE) 100 mg in sodium chloride  0.9 % 100 mL IVPB 100 mg (05/23/24 1144)   piperacillin -tazobactam (ZOSYN )  IV 3.375 g (05/23/24 1315)   TPN ADULT (ION) 70 mL/hr at 05/22/24 1718   TPN ADULT (ION)     vancomycin  1,000 mg (05/23/24 0914)    Nutritional status Signs/Symptoms: severe fat depletion, severe muscle depletion Interventions: Refer to RD note for recommendations, TPN Body mass index is 22.42 kg/m.  Data Reviewed:   CBC: Recent Labs  Lab 05/18/24 1831 05/19/24 1326 05/20/24 0350 05/21/24 0331 05/22/24 0407  WBC 14.8* 13.8* 13.8* 9.2 7.9  HGB 10.0* 9.7* 9.7* 9.7* 9.4*  HCT 30.2* 30.2* 30.6* 31.4* 30.6*  MCV 88.3 89.9 92.4 92.1 94.2  PLT  473* 491* 541* 600* 635*   Basic Metabolic Panel: Recent Labs  Lab 05/17/24 0008 05/18/24 0318 05/19/24 0337 05/20/24 0350 05/21/24 0331 05/22/24 0407 05/23/24 0500  NA 133* 134* 132* 134* 136 136  --   K 3.8 4.0 4.3 4.1 4.2 4.1  --   CL 98 101 99 101 104 104  --   CO2 25 24 24 24 25 24   --   GLUCOSE 134* 150* 124* 129* 131* 121*  --   BUN 46* 25* 22 23 15  29*  --   CREATININE 0.99 0.59 0.63 0.65 0.46 0.59 0.69  CALCIUM  7.3* 7.6* 7.5* 7.7* 7.9* 7.8*  --   MG 2.0 2.0 2.1  --   --  2.0  --   PHOS 3.4 2.5 3.5  --   --  3.8  --    GFR: Estimated Creatinine Clearance: 47.9 mL/min (by C-G formula based on SCr of 0.69 mg/dL). Liver Function Tests: Recent Labs  Lab 05/19/24 0337 05/20/24 0350 05/22/24 0407  AST 54* 32 42*  ALT 65* 47* 50*  ALKPHOS 245* 269* 265*  BILITOT 1.4* 1.2 0.8  PROT 5.5* 5.8* 6.0*  ALBUMIN  <1.5* <1.5* <1.5*   No results for input(s): LIPASE, AMYLASE in the last 168 hours. No results for input(s): AMMONIA in the last 168 hours. Coagulation Profile: Recent Labs  Lab 05/19/24 2054  INR 1.2   Cardiac Enzymes: No results  for input(s): CKTOTAL, CKMB, CKMBINDEX, TROPONINI in the last 168 hours. BNP (last 3 results) No results for input(s): PROBNP in the last 8760 hours. HbA1C: No results for input(s): HGBA1C in the last 72 hours. CBG: Recent Labs  Lab 05/20/24 0749 05/20/24 1816 05/21/24 0209 05/21/24 0754 05/21/24 1615  GLUCAP 131* 130* 144* 164* 134*   Lipid Profile: No results for input(s): CHOL, HDL, LDLCALC, TRIG, CHOLHDL, LDLDIRECT in the last 72 hours. Thyroid  Function Tests: No results for input(s): TSH, T4TOTAL, FREET4, T3FREE, THYROIDAB in the last 72 hours. Anemia Panel: No results for input(s): VITAMINB12, FOLATE, FERRITIN, TIBC, IRON, RETICCTPCT in the last 72 hours. Sepsis Labs: No results for input(s): PROCALCITON, LATICACIDVEN in the last 168 hours.  Recent Results (from the past 240 hours)  Aerobic/Anaerobic Culture w Gram Stain (surgical/deep wound)     Status: None (Preliminary result)   Collection Time: 05/22/24  3:59 PM   Specimen: Abdomen  Result Value Ref Range Status   Specimen Description   Final    ABDOMEN Performed at [facility], [address] [place], [place] [address]    Special Requests   Final    ABDOMINAL DRAIN Performed at [facility], [address] [place], [place] [address]    Gram Stain   Final    ABUNDANT WBC PRESENT, PREDOMINANTLY PMN RARE GRAM POSITIVE COCCI RARE YEAST WITH PSEUDOHYPHAE    Culture   Final    TOO YOUNG TO READ Performed at [facility], [address], [place], [place] [address]    Report Status PENDING  Incomplete  Aerobic/Anaerobic Culture w Gram Stain (surgical/deep wound)     Status: None (Preliminary result)   Collection Time: 05/22/24  3:59 PM   Specimen: Back  Result Value Ref Range Status   Specimen Description   Final    BACK Performed at [facility], [address], [place], [place] [address]     Special Requests   Final    TRANSGLUTEAL PERCUTANEOUS IR DRAIN Performed at [facility],  [address], [place], [place] [address]    Gram Stain   Final    NO WBC SEEN ABUNDANT GRAM NEGATIVE RODS RARE GRAM POSITIVE COCCI IN PAIRS RARE BUDDING YEAST SEEN    Culture   Final    TOO YOUNG TO READ Performed at [facility], [address], [place], [place] [address]    Report Status PENDING  Incomplete         Radiology Studies: No results found.         LOS: 15 days   Time spent= 35 mins    Magdalene School, MD [facility]  If 7PM-7AM, please contact night-coverage  05/23/2024, 5:26 PM

## 2024-05-23 NOTE — Progress Notes (Signed)
 Physical Therapy Treatment Patient Details Name: Elizabeth Odom MRN: [id number] DOB: 01/18/1941 Today's Date: 05/23/2024   History of Present Illness 83 y.o. female s/p rectosigmoid resection takedown on 5/29. Course complicated by A-fib with RVR and then hypotension/rectal bleeding and syncope on 05/11/2024.  Patient also had postop ileus needing NG tube and TPN.  Imaging on 05/19/2024 showed possible abscesses.  She was started on IV antibiotics by general surgery on 05/20/2024.  She underwent IR guided drains placed on 05/20/2024.  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    PT Comments  Pt was up in recliner at start of PT session. Assisted pt with sit to stand x 7 reps, initially with min assist then progressing to max assist 2* fatigue. Pt had liquid stool leakage with first 5 attempts. She was assisted to bedside commode. She was then too fatigued to stand with RW so used Stedy to stand and transfer to bed.     If plan is discharge home, recommend the following: Two people to help with walking and/or transfers;Two people to help with bathing/dressing/bathroom;Assistance with cooking/housework;Help with stairs or ramp for entrance;Assist for transportation   Can travel by private vehicle     No  Equipment Recommendations  None recommended by PT    Recommendations for Other Services       Precautions / Restrictions Precautions Precautions: Fall Recall of Precautions/Restrictions: Impaired Precaution/Restrictions Comments: ABD surgery, gluteal JP and abdominal JP Restrictions Weight Bearing Restrictions Per Provider Order: No     Mobility  Bed Mobility Overal bed mobility: Needs Assistance Bed Mobility: Sit to Supine       Sit to supine: Max assist    General bed mobility comments: Max A to control descent of trunk and for BLEs into bed    Transfers Overall transfer level: Needs assistance Equipment used: Rolling walker (2 wheels) Transfers: Sit to/from Stand Sit to Stand: Mod assist   Step pivot transfers: Min assist, +2 physical assistance, +2 safety/equipment       General transfer comment: sit to stand x 7 with RW for pericare, SPT to bedside commode, then pt fatigued so used Stedy for return to bed. Initially min assist to stand, then progressed to max assist. Pt had liquid stool drainage with first 5 sit to stands. VCs for hand and LE placement for set up.     Ambulation/Gait               General Gait Details: deferred due to MAX uncontrolled watery, loose stools.   Stairs             Wheelchair Mobility     Tilt Bed    Modified Rankin (Stroke Patients Only)       Balance Overall balance assessment: Needs assistance Sitting-balance support: Bilateral upper extremity supported, Feet supported Sitting balance-Leahy Scale: Fair     Standing balance support: Bilateral upper extremity supported, During functional activity, Reliant on assistive device for balance Standing balance-Leahy Scale: Poor                              Communication Communication Communication: No apparent difficulties  Cognition Arousal: Alert Behavior During Therapy: Flat affect   PT - Cognitive impairments: Initiation, Attention, Sequencing  PT - Cognition Comments: AxO x 3 pleasant Lady, Claustrophobic does not like door or curtain shut, lives home alone. Following commands: Impaired Following commands impaired: Follows one step commands with increased time    Cueing Cueing Techniques: Verbal cues, Tactile cues  Exercises General Exercises - Lower Extremity Long Arc Quad: AROM, Both, 10 reps, Seated    General Comments        Pertinent Vitals/Pain Pain  Assessment Pain Assessment: Faces Faces Pain Scale: Hurts even more Pain Location: perineal area Pain Descriptors / Indicators: Grimacing, Constant, Discomfort Pain Intervention(s): Limited activity within patient's tolerance, Monitored during session, Repositioned    Home Living                          Prior Function            PT Goals (current goals can now be found in the care plan section) Acute Rehab PT Goals Patient Stated Goal: I just want to go home PT Goal Formulation: With patient Time For Goal Achievement: 06/05/24 Potential to Achieve Goals: Fair Progress towards PT goals: Progressing toward goals    Frequency    Min 3X/week      PT Plan      Co-evaluation              AM-PAC PT 6 Clicks Mobility   Outcome Measure  Help needed turning from your back to your side while in a flat bed without using bedrails?: A Lot Help needed moving from lying on your back to sitting on the side of a flat bed without using bedrails?: A Lot Help needed moving to and from a bed to a chair (including a wheelchair)?: Total Help needed standing up from a chair using your arms (e.g., wheelchair or bedside chair)?: A Lot Help needed to walk in hospital room?: Total Help needed climbing 3-5 steps with a railing? : Total 6 Click Score: 9    End of Session Equipment Utilized During Treatment: Gait belt Activity Tolerance: Patient limited by fatigue;Other (comment) (limited by frequent liquid stool) Patient left: in bed;with call bell/phone within reach;with bed alarm set;with nursing/sitter in room Nurse Communication: Mobility status PT Visit Diagnosis: Muscle weakness (generalized) (M62.81);Difficulty in walking, not elsewhere classified (R26.2)     Time: 8295-6213 PT Time Calculation (min) (ACUTE ONLY): 46 min  Charges:    $Therapeutic Activity: 38-52 mins PT General Charges $$ ACUTE PT VISIT: 1 Visit                     Daymon Evans PT 05/23/2024  Acute Rehabilitation Services  Office [phone]

## 2024-05-23 NOTE — Progress Notes (Incomplete)
 Referring Physician(s): Gross,Steven  Supervising Physician: Art Largo  Patient Status:  [facility] - In-pt  Chief Complaint:  Abdominal pain, postop abdominal fluid collections, status post drain placement x 2 on 05/20/2024   Subjective:    Allergies: Elemental sulfur  Medications: Prior to Admission medications   Medication Sig Start Date End Date Taking? Authorizing Provider  amLODipine  (NORVASC ) 5 MG tablet Take 1 tablet (5 mg total) by mouth daily. Patient taking differently: Take 5 mg by mouth at bedtime. 04/08/24  Yes Patwardhan, Manish J, MD  ascorbic acid  (VITAMIN C ) 250 MG CHEW Chew 250 mg by mouth daily.   Yes [provider]  aspirin  EC 81 MG tablet Take 81 mg by mouth daily. Swallow whole.   Yes [provider]  cyanocobalamin  (VITAMIN B12) 1000 MCG tablet Take 1,000 mcg by mouth daily.   Yes [provider]  FIBER GUMMIES PO Take 1 capsule by mouth daily.   Yes [provider]  Multiple Vitamin (MULTIVITAMIN WITH MINERALS) TABS tablet Take 1 tablet by mouth daily. 11/10/23  Yes Kraig Peru, MD  Omega-3 Fatty Acids (OMEGA 3 500 PO) Take 500 mg by mouth daily.   Yes [provider]  OVER THE COUNTER MEDICATION Take 650 mg by mouth daily. Total Beets supplement   Yes [provider]  traMADol  (ULTRAM ) 50 MG tablet Take 1-2 tablets (50-100 mg total) by mouth every 6 (six) hours as needed for moderate pain (pain score 4-6) or severe pain (pain score 7-10). 05/08/24  Yes Candyce Champagne, MD     Vital Signs: BP (!) 100/56 (BP Location: Left Arm)   Pulse 78   Temp 98.2 F (36.8 C) (Oral)   Resp 18   Ht 5' 5 (1.651 m)   Wt 134 lb 11.2 oz (61.1 kg)   SpO2 98%   BMI 22.42 kg/m   Physical Exam  Imaging: CT GUIDED PERITONEAL/RETROPERITONEAL FLUID DRAIN BY PERC CATH Result Date: 05/21/2024 INDICATION: Multiple pelvic and abdominal postoperative abscesses. Planned placement of percutaneous drainage catheters in  the 2 largest collections. EXAM: CT-guided drain placement x2 TECHNIQUE: Multidetector CT imaging of the pelvis was performed following the standard protocol without IV contrast. RADIATION DOSE REDUCTION: This exam was performed according to the departmental dose-optimization program which includes automated exposure control, adjustment of the mA and/or kV according to patient size and/or use of iterative reconstruction technique. MEDICATIONS: The patient is currently admitted to the hospital and receiving intravenous antibiotics. The antibiotics were administered within an appropriate time frame prior to the initiation of the procedure. ANESTHESIA/SEDATION: Moderate (conscious) sedation was employed during this procedure. A total of Versed  1.5 mg and Fentanyl  75 mcg was administered intravenously by the radiology nurse. Total intra-service moderate Sedation Time: 58 minutes. The patient's level of consciousness and vital signs were monitored continuously by radiology nursing throughout the procedure under my direct supervision. COMPLICATIONS: None immediate. PROCEDURE: Informed written consent was obtained from the patient after a thorough discussion of the procedural risks, benefits and alternatives. All questions were addressed. Maximal Sterile Barrier Technique was utilized including caps, mask, sterile gowns, sterile gloves, sterile drape, hand hygiene and skin antiseptic. A timeout was performed prior to the initiation of the procedure. With the patient in a supine position, radiopaque markers were placed near the umbilicus and initial images of the pelvis were obtained. The patient's skin was then marked, prepped, and draped in the usual sterile fashion. Local anesthesia was achieved by infiltrating subcutaneous tissue with 1% lidocaine .  A small incision was made in the right lower quadrant and a 7 cm Yueh needle was advanced and repeat imaging demonstrated the needle to be within the abscess. The needle was  withdrawn leaving the cannula in position for an Amplatz wire to be advanced into the collection. The access site was then dilated using a 10 [place] dilator. A 10 French drainage catheter was then advanced over the guidewire and placed in the collection. Retention suture, sterile dressing, and JP bulb for applied. The patient was then rotated to a prone position with radiopaque markers placed on the gluteal region. Repeat imaging was then used to try angulate location. The patient's skin was prepped and draped in usual sterile fashion. Local infiltration with 1% lidocaine  was used to anesthetize the region. A small incision was then made and again a 7 cm Yueh needle was advanced from the skin into the collection. The needle was withdrawn and an Amplatz wire was advanced through the cannula. Repeat imaging demonstrated satisfactory position. The access site was dilated using a 10 French fascial dilator. A 10 French drainage catheter was then advanced over the guidewire and locked in position. Retention suture and sterile dressing applied. The catheter was connected to a JP bulb. IMPRESSION: Satisfactory placement of 210 French drainage catheters and the largest pelvic abscess collections in this patient. Ventral near the umbilicus and transgluteal. Electronically Signed   By: Susan Ensign   On: 05/21/2024 10:00    Labs:  CBC: Recent Labs    05/19/24 1326 05/20/24 0350 05/21/24 0331 05/22/24 0407  WBC 13.8* 13.8* 9.2 7.9  HGB 9.7* 9.7* 9.7* 9.4*  HCT 30.2* 30.6* 31.4* 30.6*  PLT 491* 541* 600* 635*    COAGS: Recent Labs    10/05/23 0247 10/11/23 1255 10/12/23 0655 05/19/24 2054  INR 1.1 1.1 1.1 1.2  APTT 30  --   --   --     BMP: Recent Labs    05/19/24 0337 05/20/24 0350 05/21/24 0331 05/22/24 0407 05/23/24 0500  NA 132* 134* 136 136  --   K 4.3 4.1 4.2 4.1  --   CL 99 101 104 104  --   CO2 24 24 25 24   --   GLUCOSE 124* 129* 131* 121*  --   BUN 22 23 15  29*  --    CALCIUM  7.5* 7.7* 7.9* 7.8*  --   CREATININE 0.63 0.65 0.46 0.59 0.69  GFRNONAA >60 >60 >60 >60 >60    LIVER FUNCTION TESTS: Recent Labs    05/15/24 0339 05/19/24 0337 05/20/24 0350 05/22/24 0407  BILITOT 1.8* 1.4* 1.2 0.8  AST 51* 54* 32 42*  ALT 55* 65* 47* 50*  ALKPHOS 78 245* 269* 265*  PROT 5.3* 5.5* 5.8* 6.0*  ALBUMIN  1.6* <1.5* <1.5* <1.5*    Assessment and Plan:  ***  Electronically Signed: D. Honore Lux, PA-C 05/23/2024, 4:03 PM   I spent a total of {Evaluation Minutes:[id number]} at the the patient's bedside AND on the patient's hospital floor or unit, greater than 50% of which was counseling/coordinating care for ***    Patient ID: Elizabeth Odom, female   DOB: 02/24/1941, 83 y.o.   MRN: [id number]

## 2024-05-23 NOTE — Progress Notes (Signed)
 OT Cancellation Note  Patient Details Name: Elizabeth Odom MRN: [id number] DOB: [birth year]   Cancelled Treatment:    Reason Eval/Treat Not Completed: Fatigue/lethargy limiting ability to participate Patient seen by PT earlier and reports fatigue now with OT attempted visit. Will see as soon as schedule allows for next OT treatment.   Marsella Suman OT/L Acute Rehabilitation Department  [phone]   05/23/2024, 4:11 PM

## 2024-05-23 NOTE — Progress Notes (Signed)
 Calorie Count Note  48 hour calorie count ordered per surgery. To start 6/13 at breakfast and run through the weekend.  - Please document % intake of all foods, drinks, and nutrition supplements patient consumes on meal tickets and place in envelope on patient's door.  - If patient skips/refuses meals please document 0% for that meal.             -Envelope placed on door    Diet: Regular Supplements: Boost Breeze po TID, each supplement provides 250 kcal and 9 grams of protein   Results available 6/16.  Arna Better, MS, RD, LDN Inpatient Clinical Dietitian Contact via Secure chat

## 2024-05-23 NOTE — TOC Progression Note (Signed)
 Transition of Care [facility]) - Progression Note    Patient Details  Name: Elizabeth Odom MRN: [id number] Date of Birth: 01/06/1941  [facility] CM/SW Contact  Amaryllis Junior, [place] Phone Number: 05/23/2024, 12:56 PM  Clinical Narrative:    Pt recommended for SNF. Pt declines but states that she lives at home alone. CSW spoke with pt granddtr and granddtr ask for SNF referral to be faxed out for bed offers. Granddtr states that she will disucc with pt as pt does not has support at home. PASRR obtained and FL2 completed. SNF referral faxed out awaitig bed offers. TOC following .   Expected Discharge Plan: Home/Self Care Barriers to Discharge: Continued Medical Work up  Expected Discharge Plan and Services   Discharge Planning Services: CM Consult   Living arrangements for the past 2 months: Apartment                                       Social Determinants of Health (SDOH) Interventions SDOH Screenings   Food Insecurity: No Food Insecurity (05/09/2024)  Housing: Low Risk  (05/09/2024)  Transportation Needs: No Transportation Needs (05/09/2024)  Utilities: Not At Risk (05/09/2024)  Depression (PHQ2-9): Low Risk  (01/02/2024)  Social Connections: Unknown (05/09/2024)  Tobacco Use: Low Risk  (05/08/2024)    Readmission Risk Interventions    05/12/2024    2:23 PM 05/09/2024    2:46 PM 10/19/2023    6:13 PM  Readmission Risk Prevention Plan  Transportation Screening Complete Complete Complete  PCP or Specialist Appt within 5-7 Days   Complete  PCP or Specialist Appt within 3-5 Days Complete Complete   Home Care Screening   Complete  Medication Review (RN CM)   Complete  HRI or Home Care Consult Complete Complete   Social Work Consult for Recovery Care Planning/Counseling Complete Complete   Palliative Care Screening Not Applicable Not Applicable   Medication Review Oceanographer) Complete Complete

## 2024-05-23 NOTE — Progress Notes (Signed)
 PHARMACY - TOTAL PARENTERAL NUTRITION CONSULT NOTE   Indication: Prolonged ileus  Patient Measurements: Height: 5' 5 (165.1 cm) Weight: 61.1 kg (134 lb 11.2 oz) IBW/kg (Calculated) : 57 TPN AdjBW (KG): 61.3 Body mass index is 22.42 kg/m.  Assessment: 18 yoF with history of diverticulitis with perforation and abscess. 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 She requested ostomy takedown and underwent LOA, colostomy takedown, small bowel resection with anastomosis on 5/29. Pharmacy is consulted to dose TPN starting 6/2 for postop ileus.  Now with anastomotic leak complicated by multiple-intraabdominal abscesses.  Below based on 6/12 labs, will recheck again 6/14 AM Glucose / Insulin : no Hx DM, A1c 5.1% - CBGs goal <150 - range 130-164 - CBG check and SSI discontinued on 6/11 Electrolytes: All WNL, including CorrCa (9.8) Renal: SCr <1, BUN increased Hepatic: LFTs remains slightly elevated. Alk Phos elevated -Albumin  low. T.bili, TG WNL I/O: Increased PO intake, 1100 mL yesterday -UOP: 1350  mL, drains: 35 mL, stool x2 -No mIVF GI Imaging:  - 6/9 CT: [name] dehiscence along the rectal anastomosis, free air and multiple fluid collections GI Surgeries / Procedures: - 5/29: LAR, SBR, colostomy takedown, LOA - 6/10 drains placed x 2 in IR (midline abd, transgluteal)  Central access: Double lumen PICC placed 6/2 TPN start date: 6/2  Nutritional Goals: - Goal TPN rate is 70 mL/hr (provides 94 g of protein and 1680 kcals per day)  RD Assessment:  Estimated Needs Total Energy Estimated Needs: 1600-1750 kcals Total Protein Estimated Needs: 80-95 grams Total Fluid Estimated Needs: >/= 1.6L  Current Nutrition:  -TPN at goal -Diet advanced to soft, to do calorie count -Pt refusing Boost supplementation, Magic cup TID per RD  Calorie count interrupted for drain  placement.  Plan:  At 18:00 Continue TPN at goal rate of 70 mL/hr Electrolytes in TPN: No change Na 150 mEq/L (max) K 50 mEq/L Ca 5 mEq/L Mg 5 mEq/L Phos 15 mmol/L Cl:Ac 1:1 No CBG checks/SSI  Resume MVI PO Monitor TPN labs on Mon/Thurs, and as needed   Luise Saint, PharmD, BCPS Secure Chat if ?s 05/23/2024 8:06 AM

## 2024-05-23 NOTE — [place] FL2 (Signed)
 [place]  MEDICAID FL2 LEVEL OF CARE FORM     IDENTIFICATION  Patient Name: Elizabeth Odom Birthdate: September 18, 1941 Sex: female Admission Date (Current Location): 05/08/2024  [facility] [place] and [place] Number:  Producer, television/film/video and Address:  [facility],  [address] [place], [place] [address]      Provider Number: [id number]  Attending Physician Name and Address:  Candyce Champagne, MD  Relative Name and Phone Number:  Genevive Ket (Granddaughter)  [phone] [facility] Phone)    Current Level of Care: Hospital Recommended Level of Care: Skilled Nursing Facility Prior Approval Number:    Date Approved/Denied:   PASRR Number: [id number]  Discharge Plan: SNF    Current Diagnoses: Patient Active Problem List   Diagnosis Date Noted   Hypokalemia 05/15/2024   Protein-calorie malnutrition, severe 05/12/2024   History of anemia 05/08/2024   Pre-diabetes 05/08/2024   Diverticular disease 05/08/2024   Premature atrial contraction 04/08/2024   Mixed hyperlipidemia 04/08/2024   Essential hypertension 04/07/2024   Diverticular disease of left colon 04/07/2024   History of DVT (deep vein thrombosis) 04/07/2024   History of pulmonary embolus (PE) 04/07/2024   Presence of IVC filter 04/07/2024   Stricture of sigmoid colon (HCC) 04/07/2024   Irritant contact dermatitis associated with fecal stoma 03/10/2024   Pancreatic cyst 01/02/2024   Hypoalbuminemia 01/02/2024   Acquired renal cyst of right kidney 01/02/2024   Fatty liver 01/02/2024   Ambulates with cane 01/02/2024   Iron deficiency anemia due to chronic blood loss 10/31/2023   Acute pulmonary embolism (HCC) 10/31/2023   Hyponatremia 10/31/2023   Acute urinary retention 10/31/2023   Malnutrition of moderate degree 10/17/2023   High risk medication use 10/16/2023   Pain 10/16/2023   Pressure injury of skin 10/16/2023   Left leg DVT (HCC) 10/05/2023   ABLA (acute blood loss anemia) 10/05/2023   Palliative care  encounter 10/05/2023   Counseling and coordination of care 10/05/2023   Need for emotional support 10/05/2023   Acute colitis 09/27/2023   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   Restless leg    Multiple fractures of cervical spine (HCC) 04/09/2016   Cervical spine fracture, initial encounter 04/09/2016   Syncope and collapse 04/09/2016   A-fib (HCC) 04/09/2016   Acute head injury 04/09/2016    Orientation RESPIRATION BLADDER Height & Weight     Self, Situation, Place  Normal Incontinent, External catheter Weight: 134 lb 11.2 oz (61.1 kg) Height:  5' 5 (165.1 cm)  BEHAVIORAL SYMPTOMS/MOOD NEUROLOGICAL BOWEL NUTRITION STATUS      Incontinent Diet (regular)  AMBULATORY STATUS COMMUNICATION OF NEEDS Skin   Limited Assist Verbally Normal                       Personal Care Assistance Level of Assistance  Bathing, Feeding, Dressing Bathing Assistance: Limited assistance Feeding assistance: Limited assistance Dressing Assistance: Limited assistance     Functional Limitations Info  Sight, Hearing, Speech Sight Info: Impaired Hearing Info: Adequate Speech Info: Adequate    SPECIAL CARE FACTORS FREQUENCY  PT (By licensed PT), OT (By licensed OT)     PT Frequency: 5x/wk OT Frequency: 5x/wk            Contractures Contractures Info: Not present    Additional Factors Info  Code Status, Allergies Code Status Info: Full code Allergies Info: Elemental Sulfur           Current Medications (05/23/2024):  This is the current hospital active  medication list Current Facility-Administered Medications  Medication Dose Route Frequency Provider Last Rate Last Admin   0.9 %  sodium chloride  infusion  250 mL Intravenous PRN Candyce Champagne, MD       acetaminophen  (TYLENOL ) tablet 500 mg  500 mg Oral TID WC & HS Candyce Champagne, MD   500 mg at 05/23/24 1139   alum & mag hydroxide-simeth (MAALOX/MYLANTA) 200-200-20 MG/5ML suspension 30 mL  30 mL  Oral Q6H PRN Candyce Champagne, MD   30 mL at 05/13/24 1326   apixaban  (ELIQUIS ) tablet 2.5 mg  2.5 mg Oral BID Candyce Champagne, MD   2.5 mg at 05/23/24 7829   ascorbic acid  (VITAMIN C ) tablet 250 mg  250 mg Oral Daily Candyce Champagne, MD   250 mg at 05/23/24 0840   Chlorhexidine  Gluconate Cloth 2 % PADS 6 each  6 each Topical Daily Candyce Champagne, MD   6 each at 05/23/24 5621   cyanocobalamin  (VITAMIN B12) tablet 1,000 mcg  1,000 mcg Oral Daily Candyce Champagne, MD   1,000 mcg at 05/23/24 3086   cyclobenzaprine (FLEXERIL) tablet 5-10 mg  5-10 mg Oral TID PRN Candyce Champagne, MD       diphenhydrAMINE  (BENADRYL ) 12.5 MG/5ML elixir 6.25 mg  6.25 mg Oral Q6H PRN Candyce Champagne, MD       feeding supplement (BOOST / RESOURCE BREEZE) liquid 1 Container  1 Container Oral TID BM Candyce Champagne, MD       fentaNYL  (SUBLIMAZE ) injection 25 mcg  25 mcg Intravenous Q4H PRN Candyce Champagne, MD   25 mcg at 05/21/24 5784   gabapentin  (NEURONTIN ) capsule 300 mg  300 mg Oral QHS Gross, Steven, MD       hydrALAZINE  (APRESOLINE ) injection 5 mg  5 mg Intravenous Q6H PRN Candyce Champagne, MD       lip balm (CARMEX) ointment   Topical PRN Candyce Champagne, MD   Given at 05/20/24 1051   liver oil-zinc  oxide (DESITIN) 40 % ointment   Topical BID Candyce Champagne, MD   Given at 05/23/24 [phone]   loperamide  (IMODIUM ) capsule 2 mg  2 mg Oral BID Candyce Champagne, MD   2 mg at 05/23/24 9528   menthol -cetylpyridinium (CEPACOL) lozenge 3 mg  1 lozenge Oral PRN Candyce Champagne, MD       metoprolol  succinate (TOPROL -XL) 24 hr tablet 12.5 mg  12.5 mg Oral Daily Candyce Champagne, MD       micafungin (MYCAMINE) 100 mg in sodium chloride  0.9 % 100 mL IVPB  100 mg Intravenous Q24H Candyce Champagne, MD 105 mL/hr at 05/23/24 1144 100 mg at 05/23/24 1144   multivitamin with minerals tablet 1 tablet  1 tablet Oral Daily Shireen Dory, [facility]   1 tablet at 05/23/24 4132   naphazoline-glycerin  (CLEAR EYES REDNESS) ophth solution 1-2 drop  1-2 drop Both Eyes QID PRN Candyce Champagne, MD        ondansetron  (ZOFRAN ) injection 4 mg  4 mg Intravenous Q6H PRN Candyce Champagne, MD   4 mg at 05/16/24 4401   Oral care mouth rinse  15 mL Mouth Rinse PRN Candyce Champagne, MD       pantoprazole  (PROTONIX ) EC tablet 40 mg  40 mg Oral Daily Candyce Champagne, MD   40 mg at 05/23/24 0841   phenol (CHLORASEPTIC) mouth spray 2 spray  2 spray Mouth/Throat PRN Candyce Champagne, MD       piperacillin -tazobactam (ZOSYN ) IVPB 3.375 g  3.375 g Intravenous Q8H Gross, Steven, MD  polycarbophil (FIBERCON) tablet 625 mg  625 mg Oral Daily Candyce Champagne, MD   625 mg at 05/23/24 0840   prochlorperazine  (COMPAZINE ) injection 5-10 mg  5-10 mg Intravenous Q6H PRN Candyce Champagne, MD   10 mg at 05/12/24 0155   simethicone  (MYLICON) 40 MG/0.6ML suspension 80 mg  80 mg Oral QID PRN Candyce Champagne, MD   80 mg at 05/15/24 2355   sodium chloride  (OCEAN) 0.65 % nasal spray 1-2 spray  1-2 spray Each Nare Q6H PRN Candyce Champagne, MD       sodium chloride  flush (NS) 0.9 % injection 10-40 mL  10-40 mL Intracatheter Q12H Gross, Steven, MD   10 mL at 05/23/24 0844   sodium chloride  flush (NS) 0.9 % injection 10-40 mL  10-40 mL Intracatheter PRN Candyce Champagne, MD   10 mL at 05/14/24 0041   sodium chloride  flush (NS) 0.9 % injection 3 mL  3 mL Intravenous Q12H Gross, Steven, MD   3 mL at 05/23/24 0844   sodium chloride  flush (NS) 0.9 % injection 3 mL  3 mL Intravenous PRN Candyce Champagne, MD       sodium chloride  flush (NS) 0.9 % injection 5 mL  5 mL Intracatheter Q8H Rosetta Cons, MD   5 mL at 05/23/24 0520   tamsulosin  (FLOMAX ) capsule 0.4 mg  0.4 mg Oral QPC supper Candyce Champagne, MD   0.4 mg at 05/22/24 1706   TPN ADULT (ION)   Intravenous Continuous TPN Shireen Dory, RPH 70 mL/hr at 05/22/24 1718 New Bag at 05/22/24 1718   TPN ADULT (ION)   Intravenous Continuous TPN Luise Saint, [facility]       traMADol  (ULTRAM ) tablet 50-100 mg  50-100 mg Oral Q6H PRN Candyce Champagne, MD   100 mg at 05/23/24 1138   vancomycin  (VANCOCIN ) IVPB 1000  mg/200 mL premix  1,000 mg Intravenous Q24H Pham, Anh P, RPH 200 mL/hr at 05/23/24 0914 1,000 mg at 05/23/24 3086   witch hazel-glycerin  (TUCKS) pad   Topical PRN Candyce Champagne, MD         Discharge Medications: Please see discharge summary for a list of discharge medications.  Relevant Imaging Results:  Relevant Lab Results:   Additional Information SSN [id number] [place], [place]

## 2024-05-23 NOTE — Plan of Care (Signed)
  Problem: Education: Goal: Verbalization of understanding of the causes of altered bowel function will improve Outcome: Progressing   Problem: Bowel/Gastric: Goal: Gastrointestinal status for postoperative course will improve Outcome: Progressing   Problem: Health Behavior/Discharge Planning: Goal: Identification of community resources to assist with postoperative recovery needs will improve Outcome: Progressing   Problem: Nutritional: Goal: Will attain and maintain optimal nutritional status will improve Outcome: Progressing   Problem: Clinical Measurements: Goal: Postoperative complications will be avoided or minimized Outcome: Progressing   Problem: Respiratory: Goal: Respiratory status will improve Outcome: Progressing   Problem: Skin Integrity: Goal: Will show signs of wound healing Outcome: Progressing   Problem: Education: Goal: Knowledge of General Education information will improve Description: Including pain rating scale, medication(s)/side effects and non-pharmacologic comfort measures Outcome: Progressing   Problem: Health Behavior/Discharge Planning: Goal: Ability to manage health-related needs will improve Outcome: Progressing   Problem: Clinical Measurements: Goal: Ability to maintain clinical measurements within normal limits will improve Outcome: Progressing Goal: Will remain free from infection Outcome: Progressing Goal: Diagnostic test results will improve Outcome: Progressing Goal: Respiratory complications will improve Outcome: Progressing Goal: Cardiovascular complication will be avoided Outcome: Progressing   Problem: Activity: Goal: Risk for activity intolerance will decrease Outcome: Progressing   Problem: Coping: Goal: Level of anxiety will decrease Outcome: Progressing   Problem: Elimination: Goal: Will not experience complications related to bowel motility Outcome: Progressing Goal: Will not experience complications related to  urinary retention Outcome: Progressing   Problem: Pain Managment: Goal: General experience of comfort will improve and/or be controlled Outcome: Progressing   Problem: Safety: Goal: Ability to remain free from injury will improve Outcome: Progressing   Problem: Skin Integrity: Goal: Risk for impaired skin integrity will decrease Outcome: Progressing   Problem: Education: Goal: Ability to describe self-care measures that may prevent or decrease complications (Diabetes Survival Skills Education) will improve Outcome: Progressing Goal: Individualized Educational Video(s) Outcome: Progressing   Problem: Coping: Goal: Ability to adjust to condition or change in health will improve Outcome: Progressing   Problem: Fluid Volume: Goal: Ability to maintain a balanced intake and output will improve Outcome: Progressing   Problem: Health Behavior/Discharge Planning: Goal: Ability to identify and utilize available resources and services will improve Outcome: Progressing Goal: Ability to manage health-related needs will improve Outcome: Progressing   Problem: Metabolic: Goal: Ability to maintain appropriate glucose levels will improve Outcome: Progressing   Problem: Nutritional: Goal: Maintenance of adequate nutrition will improve Outcome: Progressing Goal: Progress toward achieving an optimal weight will improve Outcome: Progressing   Problem: Skin Integrity: Goal: Risk for impaired skin integrity will decrease Outcome: Progressing   Problem: Tissue Perfusion: Goal: Adequacy of tissue perfusion will improve Outcome: Progressing

## 2024-05-23 NOTE — Progress Notes (Signed)
 05/23/2024  Elizabeth Odom [id number] 25-Jan-1941  CARE TEAM: PCP: Anthon Kins, MD  Outpatient Care Team: Patient Care Team: Anthon Kins, MD as PCP - General (Internal Medicine) Cody Das, MD as PCP - Cardiology (Cardiology) Lajuan Pila, MD as Consulting Physician (Gastroenterology) Shela Derby, MD as Consulting Physician (General Surgery) Glory Larsen, MD (Neurology) Lahoma Pigg, MD as Consulting Physician (Urology) Sandee Crook Margart Shears, MD as Consulting Physician (Cardiology) Candyce Champagne, MD as Consulting Physician (Colon and Rectal Surgery)  Inpatient Treatment Team: Treatment Team:  Candyce Champagne, MD Ruel Cotta, Heinz Llano, MD Ozell Blunt, MD Vladimir Groves Radiology, MD Lexine Redder, MD Audria Leather, MD Merrill Abide, OT Consuela Denier, RN Ahmad Hotter, NT Lafaye Pierini, RN Luise Saint, [facility]   Problem List:   Principal Problem:   Diverticular disease Active Problems:   A-fib [facility])   Restless leg   ABLA (acute blood loss anemia)   Need for emotional support   Acute urinary retention   Mixed hyperlipidemia   Essential hypertension   Diverticular disease of left colon   History of DVT (deep vein thrombosis)   History of pulmonary embolus (PE)   Pre-diabetes   Presence of IVC filter   Stricture of sigmoid colon (HCC)   Protein-calorie malnutrition, severe   Hypokalemia   05/08/2024  POST-OPERATIVE DIAGNOSIS:   COLOSTOMY FOR RESECTION, DESIRE FOR OSTOMY TAKEDOWN RECTAL STRICTURE HISTORY OF DIVERTICULITIS WITH PERFORATION & ABSCESS   PROCEDURE:   -ROBOTIC RECTOSIGMOID RESECTION (LAR) -TAKEDOWN OF END COLOSTOMY WITH ANASTOMOSIS -RESECTION OF SMALL INTESTINE WITH ANASTOMOSIS -SMALL BOWEL REPAIR -LYSIS OF ADHESIONS x 115 MINUTES (66% OF CASE),  -INTRAOPERATIVE ASSESSMENT OF TISSUE VASCULAR PERFUSION USING ICG (indocyanine green ) IMMUNOFLUORESCENCE,  -TRANSVERSUS ABDOMINIS PLANE (TAP) BLOCK -  BILATERAL -FLEXIBLE SIGMOIDOSCOPY   SURGEON:  Eddye Goodie, MD  OR FINDINGS:  Very dense adhesions of small bowel especially to pelvis and retroperitoneum.  Extremely concrete dense adhesions with friable tissue near her old prior small bowel anastomosis and distal jejunum.  Required small bowel repair and required small bowel resection including old anastomosis given severe tissues.  Patient had significant fibrotic stricturing of rectal stump at the mid/distal rectal junction.  Could not be released up.  Ended up doing resection of rectal stump to mid/distal rectal junction.  No obvious metastatic disease on visceral parietal peritoneum or liver.   It is a 29mm EEA anastomosis ( distal descending colon  connected to mid/distal rectal junction.)  It rests 7 cm from the anal verge by rigid proctoscopy.  05/20/2024  Post procedural Dx: Post op abscess, multiple   Technically successful CT guided placed of a  10 Fr drainage catheter placement x 2  into the midline ventral and dorsal perirectal abscesses.     Rosetta Cons, MD    Assessment [facility] Stay = 15 days) 15 Days Post-Op    Ileus resolving   Fluid collections raises suspicion of contained leak s/p drainage 6/10    Plan:  Advance to solid diet.  Imodium  twice daily to gently slow down.  Continue fiber supplement.  IV antibiotics.  Vancomycin /piperacillin /disimpact him.  Cultures showing rare yeast.  Will add micafungin.  Anticipate 14-day course of oral antibiotics for now  Continue drains for fluid collections.  Patient getting groggy with narcotics.  Claims Norco does not help but is very sleepy with the hydrocodone .  See if we can transition to tramadol  and add in Flexeril and gabapentin  instead for nonnarcotic  options since the opioids make her rather sedated/groggy   TPN.    Try calorie counts and see if she has adequate intake to start weaning her TPN.  She is very malnourished so I am hesitant to move  off until I know she is definitely eating.    With collections drained and on appropriate antibiotics, retry removing Foley catheter.  If fails then replace and leave for several weeks until can follow-up with urology as an outpatient.  Otherwise try and get out so she does not have a risk of UTIs   No rectal tubes.  Atrial fibrillation postop.  History of a prior event.    Cardiology signed off for now.  Reconsult as needed.  Stop telemetry for now  Restarted DOAC Eliquis  6/12 after prior hematochezia.  No strong evidence of that.  Follow hemoglobin.  Rate controlled switch from amlodipine  to metoprolol .  Less tachycardic.  Blood pressure soft.  Back down from 25 to 12.5 mg of metoprolol  and follow with PRN (as needed) breakthrough  -monitor electrolytes & replace as needed.  Keep K>4, Mg>2, Phos>3  -VTE prophylaxis- SCDs.  Anticoagulation as appropriate  -mobilize as tolerated to help recovery.  Enlist therapies in moderate/high risk patients as appropriate.  Patient is refusing skilled facility or any rehab but is not getting up much.  Try to have strategies to help her get up and mobilize and actually work with physical therapy, or she is going to lose her argument.    I updated the patient's status to the patient and nurse.  Recommendations were made.  Questions were answered.  They expressed understanding & appreciation.  -Disposition: TBD      I reviewed last 24 h vitals and pain scores, last 48 h intake and output, last 24 h labs and trends, and last 24 h imaging results.  I have reviewed this patient's available data, including medical history, events of note, test results, etc as part of my evaluation.   A significant portion of that time was spent in counseling. Care during the described time interval was provided by me.  This care required moderate level of medical decision making.  05/23/2024    Subjective: (Chief complaint)  Patient got Norco and is  sleepy/groggy.  No nausea or vomiting with soft diet.  Not wanting to eat much.  Not wanting to get out of bed.  No nausea or vomiting.  Objective:  Vital signs:  Vitals:   05/22/24 0558 05/22/24 1410 05/22/24 2116 05/23/24 0623  BP: 115/71 122/76 (!) 94/49 109/65  Pulse: 91 88 90 88  Resp: 17 18 20 16   Temp: 98.4 F (36.9 C) 98.7 F (37.1 C) 98.5 F (36.9 C) 97.9 F (36.6 C)  TempSrc: Oral Oral Oral   SpO2: 99% 97% 97% 98%  Weight:      Height:        Last BM Date : 05/22/24  Intake/Output   Yesterday:  06/12 0701 - 06/13 0700 In: 3394.6 [P.O.:1100; I.V.:1321.8; IV Piggyback:972.8] Out: 1385 [Urine:1350; Drains:35] This shift:  No intake/output data recorded.  Bowel function:  Flatus: YES  BM:  YES  Drain:  Right lower quadrant drain: Scant purulent  Transgluteal drain: thin serosanguineous with brown tinge   Physical Exam:  General: Resting but easily awakens.  Less mumbling.  Talking.  Asking somewhat repeated questions but more directable today and less confused.  .    Eyes: PERRL, normal EOM.  Sclera clear.  No icterus Neuro: CN II-XII intact  w/o focal sensory/motor deficits.  All extremities.  No facial droop. Lymph: No head/neck/groin lymphadenopathy Psych:  No delerium/psychosis/paranoia.  Oriented x 4 HENT: Normocephalic, Mucus membranes moist.  No thrush Neck: Supple, No tracheal deviation.  No obvious thyromegaly.  Prefers to sit with head flexed and chin down.  Does not want to have her neck back.  Pillow adjusted. Chest: No pain to chest wall compression.  Good respiratory excursion.  No audible wheezing CV:  Pulses intact.  Regular rhythm.  No major extremity edema MS: Normal AROM mjr joints.  No obvious deformity  Abdomen:  Soft.  Moderately distended.  Nontender.  No guarding.  No complaints of pain to percussion or benching or cough.  No evidence of peritonitis. Old ostomy incision with normal healing ridge.    GU: Foley in place with  clear light yellow urine Rectal: deferred today  Ext:   No deformity.  No mjr edema.  No cyanosis Skin: No petechiae / purpurea.  No major sores.  Warm and dry    Results:   Cultures: Recent Results (from the past 720 hours)  Urine Culture     Status: Abnormal   Collection Time: 05/09/24  8:30 PM   Specimen: Urine, Clean Catch  Result Value Ref Range Status   Specimen Description   Final    URINE, CLEAN CATCH Performed at [facility], [address] [place], [place] [address]    Special Requests   Final    NONE Performed at [facility], [address], [place], [place] [address]    Culture (A)  Final    <10,000 COLONIES/mL INSIGNIFICANT GROWTH Performed at [facility], [address], [place], [place] [address]    Report Status 05/11/2024 FINAL  Final  Aerobic/Anaerobic Culture w Gram Stain (surgical/deep wound)     Status: None (Preliminary result)   Collection Time: 05/22/24  3:59 PM   Specimen: Abdomen  Result Value Ref Range Status   Specimen Description   Final    ABDOMEN Performed at [facility], [address], [place], [place] [address]    Special Requests   Final    ABDOMINAL DRAIN Performed at [facility], [address], [place], [place] [address]    Gram Stain   Final    ABUNDANT WBC PRESENT, PREDOMINANTLY PMN RARE GRAM POSITIVE COCCI RARE YEAST WITH PSEUDOHYPHAE Performed at [facility], [address] [place], [place] [address]    Culture PENDING  Incomplete   Report Status PENDING  Incomplete  Aerobic/Anaerobic Culture w Gram Stain (surgical/deep wound)     Status: None (Preliminary result)   Collection Time: 05/22/24  3:59 PM   Specimen: Back  Result Value Ref Range Status   Specimen Description   Final    BACK Performed at [facility], [address] [place], [place] [address]    Special Requests   Final    TRANSGLUTEAL  PERCUTANEOUS IR DRAIN Performed at [facility], [address], [place], [place] [address]    Gram Stain   Final    NO WBC SEEN ABUNDANT GRAM NEGATIVE RODS RARE GRAM POSITIVE COCCI IN PAIRS RARE BUDDING YEAST SEEN Performed at [facility], [address], [place], [place] [address]    Culture PENDING  Incomplete   Report Status PENDING  Incomplete    Labs: Results for orders placed or performed during the hospital encounter of 05/08/24 (from the  past 48 hours)  Glucose, capillary     Status: Abnormal   Collection Time: 05/21/24  7:54 AM  Result Value Ref Range   Glucose-Capillary 164 (H) 70 - 99 mg/dL    Comment: Glucose reference range applies only to samples taken after fasting for at least 8 hours.  Glucose, capillary     Status: Abnormal   Collection Time: 05/21/24  4:15 PM  Result Value Ref Range   Glucose-Capillary 134 (H) 70 - 99 mg/dL    Comment: Glucose reference range applies only to samples taken after fasting for at least 8 hours.  Comprehensive metabolic panel     Status: Abnormal   Collection Time: 05/22/24  4:07 AM  Result Value Ref Range   Sodium 136 135 - 145 mmol/L   Potassium 4.1 3.5 - 5.1 mmol/L   Chloride 104 98 - 111 mmol/L   CO2 24 22 - 32 mmol/L   Glucose, Bld 121 (H) 70 - 99 mg/dL    Comment: Glucose reference range applies only to samples taken after fasting for at least 8 hours.   BUN 29 (H) 8 - 23 mg/dL   Creatinine, Ser 6.43 0.44 - 1.00 mg/dL   Calcium  7.8 (L) 8.9 - 10.3 mg/dL   Total Protein 6.0 (L) 6.5 - 8.1 g/dL   Albumin  <1.5 (L) 3.5 - 5.0 g/dL   AST 42 (H) 15 - 41 U/L   ALT 50 (H) 0 - 44 U/L   Alkaline Phosphatase 265 (H) 38 - 126 U/L   Total Bilirubin 0.8 0.0 - 1.2 mg/dL   GFR, Estimated >32 >95 mL/min    Comment: (NOTE) Calculated using the CKD-EPI Creatinine Equation (2021)    Anion gap 8 5 - 15    Comment: Performed at [facility], [address], [place], [place] [address]   Magnesium      Status: None   Collection Time: 05/22/24  4:07 AM  Result Value Ref Range   Magnesium  2.0 1.7 - 2.4 mg/dL    Comment: Performed at [facility], [address], [place], [place] [address]  Phosphorus     Status: None   Collection Time: 05/22/24  4:07 AM  Result Value Ref Range   Phosphorus 3.8 2.5 - 4.6 mg/dL    Comment: Performed at [facility], [address] [place], [place] [address]  CBC     Status: Abnormal   Collection Time: 05/22/24  4:07 AM  Result Value Ref Range   WBC 7.9 4.0 - 10.5 K/uL   RBC 3.25 (L) 3.87 - 5.11 MIL/uL   Hemoglobin 9.4 (L) 12.0 - 15.0 g/dL   HCT 09.3 (L) 23.5 - 57.3 %   MCV 94.2 80.0 - 100.0 fL   MCH 28.9 26.0 - 34.0 pg   MCHC 30.7 30.0 - 36.0 g/dL   RDW 22.0 (H) 25.4 - 27.0 %   Platelets 635 (H) 150 - 400 K/uL   nRBC 0.0 0.0 - 0.2 %    Comment: Performed at [facility], [address] [place], [place] [address]  Aerobic/Anaerobic Culture w Gram Stain (surgical/deep wound)     Status: None (Preliminary result)   Collection Time: 05/22/24  3:59 PM   Specimen: Abdomen  Result Value Ref Range   Specimen Description      ABDOMEN Performed at [facility], [address] [place], [place] [address]    Special Requests      ABDOMINAL DRAIN Performed  at [facility], [address], [place], [place] [address]    Gram Stain      ABUNDANT WBC PRESENT, PREDOMINANTLY PMN RARE GRAM POSITIVE COCCI RARE YEAST WITH PSEUDOHYPHAE Performed at [facility], [address] [place], [place] [address]    Culture PENDING    Report Status PENDING   Aerobic/Anaerobic Culture w Gram Stain (surgical/deep wound)     Status: None (Preliminary result)   Collection Time: 05/22/24  3:59 PM   Specimen: Back  Result Value Ref Range   Specimen Description      BACK Performed at [facility], [address] [place], [place]  [address]    Special Requests      TRANSGLUTEAL PERCUTANEOUS IR DRAIN Performed at [facility], [address] [place], [place] [address]    Gram Stain      NO WBC SEEN ABUNDANT GRAM NEGATIVE RODS RARE GRAM POSITIVE COCCI IN PAIRS RARE BUDDING YEAST SEEN Performed at [facility], [address], [place], [place] [address]    Culture PENDING    Report Status PENDING   Creatinine, serum     Status: None   Collection Time: 05/23/24  5:00 AM  Result Value Ref Range   Creatinine, Ser 0.69 0.44 - 1.00 mg/dL   GFR, Estimated >13 >08 mL/min    Comment: (NOTE) Calculated using the CKD-EPI Creatinine Equation (2021) Performed at [facility], [address], [place], [place] [address]     Imaging / Studies: No results found.    Medications / Allergies: per chart  Antibiotics: Anti-infectives (From admission, onward)    Start     Dose/Rate Route Frequency Ordered Stop   05/22/24 0800  vancomycin  (VANCOCIN ) IVPB 1000 mg/200 mL premix        1,000 mg 200 mL/hr over 60 Minutes Intravenous Every 24 hours 05/21/24 0634     05/21/24 0730  vancomycin  (VANCOREADY) IVPB 1250 mg/250 mL        1,250 mg 166.7 mL/hr over 90 Minutes Intravenous  Once 05/21/24 0634 05/21/24 1043   05/19/24 1400  piperacillin -tazobactam (ZOSYN ) IVPB 3.375 g        3.375 g 12.5 mL/hr over 240 Minutes Intravenous Every 8 hours 05/19/24 1303 05/24/24 1359   05/09/24 0900  erythromycin  250 mg in sodium chloride  0.9 % 100 mL IVPB        250 mg 100 mL/hr over 60 Minutes Intravenous Every 8 hours 05/09/24 0732 05/11/24 0044   05/08/24 2200  cefoTEtan  (CEFOTAN ) 2 g in sodium chloride  0.9 % 100 mL IVPB        2 g 200 mL/hr over 30 Minutes Intravenous Every 12 hours 05/08/24 1759 05/09/24 0749   05/08/24 1400  neomycin  (MYCIFRADIN ) tablet 1,000 mg  Status:  Discontinued       Placed in And Linked Group   1,000 mg Oral 3 times per day 05/08/24 1119 05/08/24 1120    05/08/24 1400  metroNIDAZOLE  (FLAGYL ) tablet 1,000 mg  Status:  Discontinued       Placed in And Linked Group   1,000 mg Oral 3 times per day 05/08/24 1119 05/08/24 1120   05/08/24 1130  cefoTEtan  (CEFOTAN ) 2 g in sodium chloride  0.9 % 100 mL IVPB        2 g 200 mL/hr over 30 Minutes Intravenous On call to O.R. 05/08/24 1119 05/09/24 0749         Note: Portions of this  report may have been transcribed using voice recognition software. Every effort was made to ensure accuracy; however, inadvertent computerized transcription errors may be present.   Any transcriptional errors that result from this process are unintentional.    Eddye Goodie, MD, FACS, MASCRS Esophageal, Gastrointestinal & Colorectal Surgery Robotic and Minimally Invasive Surgery  [facility] [address] [place], [place] [address] [phone] Fax [phone] Main  CONTACT INFORMATION: Weekday (9AM-5PM): Call [facility] main office at [phone] Weeknight (5PM-9AM) or Weekend/Holiday: Check EPIC Web Links tab & use AMION (password  [email or web address]) for General Surgery [facility] coverage  Please, DO NOT use SecureChat  (it is not reliable communication to reach operating surgeons & will lead to a delay in care).   Epic staff messaging available for outptient concerns needing 1-2 business day response.      05/23/2024  7:17 AM

## 2024-05-23 NOTE — Progress Notes (Signed)
 Referring Physician(s): Gross,Steven  Supervising Physician: Art Largo  Patient Status:  [facility] - In-pt  Chief Complaint:  Abdominal pain, postop abdominal fluid collections, status post drain placement x 2 on 05/20/2024   Subjective: Patient states she feels a little better today.  She still has some loose stools but less abdominal pain, denies nausea, vomiting or fever.   Allergies: Elemental sulfur  Medications: Prior to Admission medications   Medication Sig Start Date End Date Taking? Authorizing Provider  amLODipine  (NORVASC ) 5 MG tablet Take 1 tablet (5 mg total) by mouth daily. Patient taking differently: Take 5 mg by mouth at bedtime. 04/08/24  Yes Patwardhan, Manish J, MD  ascorbic acid  (VITAMIN C ) 250 MG CHEW Chew 250 mg by mouth daily.   Yes [provider]  aspirin  EC 81 MG tablet Take 81 mg by mouth daily. Swallow whole.   Yes [provider]  cyanocobalamin  (VITAMIN B12) 1000 MCG tablet Take 1,000 mcg by mouth daily.   Yes [provider]  FIBER GUMMIES PO Take 1 capsule by mouth daily.   Yes [provider]  Multiple Vitamin (MULTIVITAMIN WITH MINERALS) TABS tablet Take 1 tablet by mouth daily. 11/10/23  Yes Kraig Peru, MD  Omega-3 Fatty Acids (OMEGA 3 500 PO) Take 500 mg by mouth daily.   Yes [provider]  OVER THE COUNTER MEDICATION Take 650 mg by mouth daily. Total Beets supplement   Yes [provider]  traMADol  (ULTRAM ) 50 MG tablet Take 1-2 tablets (50-100 mg total) by mouth every 6 (six) hours as needed for moderate pain (pain score 4-6) or severe pain (pain score 7-10). 05/08/24  Yes Candyce Champagne, MD     Vital Signs: BP (!) 100/56 (BP Location: Left Arm)   Pulse 78   Temp 98.2 F (36.8 C) (Oral)   Resp 18   Ht 5' 5 (1.651 m)   Wt 134 lb 11.2 oz (61.1 kg)   SpO2 98%   BMI 22.42 kg/m   Physical Exam: Awake, answers questions okay. Right anterior abdominal and left transgluteal drains  intact, outputs ranged from 10 to 25 cc turbid brown fluid; both drains flushed earlier by nursing without difficulty   Imaging: CT GUIDED PERITONEAL/RETROPERITONEAL FLUID DRAIN BY [facility] CATH Result Date: 05/21/2024 INDICATION: Multiple pelvic and abdominal postoperative abscesses. Planned placement of percutaneous drainage catheters in the 2 largest collections. EXAM: CT-guided drain placement x2 TECHNIQUE: Multidetector CT imaging of the pelvis was performed following the standard protocol without IV contrast. RADIATION DOSE REDUCTION: This exam was performed according to the departmental dose-optimization program which includes automated exposure control, adjustment of the mA and/or kV according to patient size and/or use of iterative reconstruction technique. MEDICATIONS: The patient is currently admitted to the hospital and receiving intravenous antibiotics. The antibiotics were administered within an appropriate time frame prior to the initiation of the procedure. ANESTHESIA/SEDATION: Moderate (conscious) sedation was employed during this procedure. A total of Versed  1.5 mg and Fentanyl  75 mcg was administered intravenously by the radiology nurse. Total intra-service moderate Sedation Time: 58 minutes. The patient's level of consciousness and vital signs were monitored continuously by radiology nursing throughout the procedure under my direct supervision. COMPLICATIONS: None immediate. PROCEDURE: Informed written consent was obtained from the patient after a thorough discussion of the procedural risks, benefits and alternatives. All questions were addressed. Maximal Sterile Barrier Technique was utilized including caps, mask, sterile gowns, sterile gloves, sterile drape, hand hygiene and skin antiseptic. A timeout was performed  prior to the initiation of the procedure. With the patient in a supine position, radiopaque markers were placed near the umbilicus and initial images of the pelvis were obtained. The  patient's skin was then marked, prepped, and draped in the usual sterile fashion. Local anesthesia was achieved by infiltrating subcutaneous tissue with 1% lidocaine . A small incision was made in the right lower quadrant and a 7 cm Yueh needle was advanced and repeat imaging demonstrated the needle to be within the abscess. The needle was withdrawn leaving the cannula in position for an Amplatz wire to be advanced into the collection. The access site was then dilated using a 10 [place] dilator. A 10 French drainage catheter was then advanced over the guidewire and placed in the collection. Retention suture, sterile dressing, and JP bulb for applied. The patient was then rotated to a prone position with radiopaque markers placed on the gluteal region. Repeat imaging was then used to try angulate location. The patient's skin was prepped and draped in usual sterile fashion. Local infiltration with 1% lidocaine  was used to anesthetize the region. A small incision was then made and again a 7 cm Yueh needle was advanced from the skin into the collection. The needle was withdrawn and an Amplatz wire was advanced through the cannula. Repeat imaging demonstrated satisfactory position. The access site was dilated using a 10 French fascial dilator. A 10 French drainage catheter was then advanced over the guidewire and locked in position. Retention suture and sterile dressing applied. The catheter was connected to a JP bulb. IMPRESSION: Satisfactory placement of 210 French drainage catheters and the largest pelvic abscess collections in this patient. Ventral near the umbilicus and transgluteal. Electronically Signed   By: Susan Ensign   On: 05/21/2024 10:00    Labs:  CBC: Recent Labs    05/19/24 1326 05/20/24 0350 05/21/24 0331 05/22/24 0407  WBC 13.8* 13.8* 9.2 7.9  HGB 9.7* 9.7* 9.7* 9.4*  HCT 30.2* 30.6* 31.4* 30.6*  PLT 491* 541* 600* 635*    COAGS: Recent Labs    10/05/23 0247 10/11/23 1255  10/12/23 0655 05/19/24 2054  INR 1.1 1.1 1.1 1.2  APTT 30  --   --   --     BMP: Recent Labs    05/19/24 0337 05/20/24 0350 05/21/24 0331 05/22/24 0407 05/23/24 0500  NA 132* 134* 136 136  --   K 4.3 4.1 4.2 4.1  --   CL 99 101 104 104  --   CO2 24 24 25 24   --   GLUCOSE 124* 129* 131* 121*  --   BUN 22 23 15  29*  --   CALCIUM  7.5* 7.7* 7.9* 7.8*  --   CREATININE 0.63 0.65 0.46 0.59 0.69  GFRNONAA >60 >60 >60 >60 >60    LIVER FUNCTION TESTS: Recent Labs    05/15/24 0339 05/19/24 0337 05/20/24 0350 05/22/24 0407  BILITOT 1.8* 1.4* 1.2 0.8  AST 51* 54* 32 42*  ALT 55* 65* 47* 50*  ALKPHOS 78 245* 269* 265*  PROT 5.3* 5.5* 5.8* 6.0*  ALBUMIN  1.6* <1.5* <1.5* <1.5*    Assessment and Plan: 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 in October of last year; status post robotic rectosigmoid resection /takedown of end colostomy with anastomosis /resection of small intestine with anastomosis, small bowel repair on 05/08/2024; now with postop abdominal fluid collections, status post drain placement x 2 on 05/20/2024; afebrile, no new labs  today, drain fluid cultures pending  Drain Location: RLQ/left transgluteal Size: Fr size: 10 Fr Date of placement: 05/20/24  Currently to: Drain collection device: suction bulb 24 hour output:  Output by Drain (mL) 05/21/24 0701 - 05/21/24 1900 05/21/24 1901 - 05/22/24 0700 05/22/24 0701 - 05/22/24 1900 05/22/24 1901 - 05/23/24 0700 05/23/24 0701 - 05/23/24 1611  Closed System Drain 1 Left;Midline Abdomen Bulb (JP) 10 Fr. 30 20 20 5  0  Closed System Drain 1 Left Buttock Bulb (JP) 10 Fr. 160 5 5 5 20       Current examination: Flushes/aspirates easily.  Insertion site unremarkable. Suture  in place. Dressed appropriately.   Plan: Continue TID flushes with 5 cc NS. Record output Q shift. Dressing changes QD or PRN if soiled.  Call IR APP or on call IR  MD if difficulty flushing or sudden change in drain output.  Repeat imaging/possible drain injection once output < 10 mL/QD (excluding flush material). Consideration for drain removal if output is < 10 mL/QD (excluding flush material), pending discussion with the providing surgical service.  Discharge planning: Please contact IR APP or on call IR MD prior to patient d/c to ensure appropriate follow up plans are in place. Typically patient will follow up with IR clinic 10-14 days post d/c for repeat imaging/possible drain injection. IR scheduler will contact patient with date/time of appointment. Patient will need to flush drain QD with 5 cc NS, record output QD, dressing changes every 2-3 days or earlier if soiled.   IR will continue to follow - please call with questions or concerns.      Electronically Signed: D. Honore Lux, PA-C 05/23/2024, 4:07 PM   I spent a total of 15 Minutes at the the patient's bedside AND on the patient's hospital floor or unit, greater than 50% of which was counseling/coordinating care for abdominal/pelvic fluid collection drains x 2     Patient ID: Elizabeth Odom, female   DOB: July 26, 1941, 83 y.o.   MRN: [id number]

## 2024-05-24 DIAGNOSIS — K5790 Diverticulosis of intestine, part unspecified, without perforation or abscess without bleeding: Secondary | ICD-10-CM | POA: Diagnosis not present

## 2024-05-24 LAB — CBC
HCT: 30.3 % — ABNORMAL LOW (ref 36.0–46.0)
Hemoglobin: 9.3 g/dL — ABNORMAL LOW (ref 12.0–15.0)
MCH: 28.9 pg (ref 26.0–34.0)
MCHC: 30.7 g/dL (ref 30.0–36.0)
MCV: 94.1 fL (ref 80.0–100.0)
Platelets: 635 10*3/uL — ABNORMAL HIGH (ref 150–400)
RBC: 3.22 MIL/uL — ABNORMAL LOW (ref 3.87–5.11)
RDW: 16.3 % — ABNORMAL HIGH (ref 11.5–15.5)
WBC: 9.4 10*3/uL (ref 4.0–10.5)
nRBC: 0 % (ref 0.0–0.2)

## 2024-05-24 LAB — COMPREHENSIVE METABOLIC PANEL WITH GFR
ALT: 39 U/L (ref 0–44)
AST: 26 U/L (ref 15–41)
Albumin: <1.5 g/dL — ABNORMAL LOW (ref 3.5–5.0)
Alkaline Phosphatase: 226 U/L — ABNORMAL HIGH (ref 38–126)
Anion gap: 6 (ref 5–15)
BUN: 30 mg/dL — ABNORMAL HIGH (ref 8–23)
CO2: 25 mmol/L (ref 22–32)
Calcium: 7.8 mg/dL — ABNORMAL LOW (ref 8.9–10.3)
Chloride: 101 mmol/L (ref 98–111)
Creatinine, Ser: 0.64 mg/dL (ref 0.44–1.00)
GFR, Estimated: >60 mL/min (ref >60)
Glucose, Bld: 118 mg/dL — ABNORMAL HIGH (ref 70–99)
Potassium: 4 mmol/L (ref 3.5–5.1)
Sodium: 132 mmol/L — ABNORMAL LOW (ref 135–145)
Total Bilirubin: 1 mg/dL (ref 0.0–1.2)
Total Protein: 6.2 g/dL — ABNORMAL LOW (ref 6.5–8.1)

## 2024-05-24 LAB — MAGNESIUM: Magnesium: 1.9 mg/dL (ref 1.7–2.4)

## 2024-05-24 LAB — PHOSPHORUS: Phosphorus: 3.2 mg/dL (ref 2.5–4.6)

## 2024-05-24 MED ORDER — TRAVASOL 10 % IV SOLN
INTRAVENOUS | Status: AC
  Filled 2024-05-24: qty 940.8

## 2024-05-24 NOTE — Progress Notes (Signed)
 PHARMACY - TOTAL PARENTERAL NUTRITION CONSULT NOTE   Indication: Prolonged ileus  Patient Measurements: Height: 5' 5 (165.1 cm) Weight: 61.5 kg (135 lb 9.3 oz) IBW/kg (Calculated) : 57 TPN AdjBW (KG): 61.3 Body mass index is 22.56 kg/m.  Assessment: 73 yoF with history of diverticulitis with perforation and abscess. 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 She requested ostomy takedown and underwent LOA, colostomy takedown, small bowel resection with anastomosis on 5/29. Pharmacy is consulted to dose TPN starting 6/2 for postop ileus.  Now with anastomotic leak complicated by multiple-intraabdominal abscesses.  Glucose / Insulin : no Hx DM, A1c 5.1% - blood glucose 118 this AM - CBG check and SSI discontinued on 6/11 Electrolytes: All WNL, including CorrCa (9.8) Renal: SCr <1, BUN increased Hepatic: LFTs now WNL. Alk Phos elevated but improved -Albumin  low. T.bili, TG WNL I/O: PO intake, 738 mL yesterday, down -UOP: not documented -No mIVF GI Imaging:  - 6/9 CT: [name] dehiscence along the rectal anastomosis, free air and multiple fluid collections GI Surgeries / Procedures: - 5/29: LAR, SBR, colostomy takedown, LOA - 6/10 drains placed x 2 in IR (midline abd, transgluteal)  Central access: Double lumen PICC placed 6/2 TPN start date: 6/2  Nutritional Goals: - Goal TPN rate is 70 mL/hr (provides 94 g of protein and 1680 kcals per day)  RD Assessment:  Estimated Needs Total Energy Estimated Needs: 1600-1750 kcals Total Protein Estimated Needs: 80-95 grams Total Fluid Estimated Needs: >/= 1.6L  Current Nutrition:  -TPN at goal -Diet advanced to soft - Calorie count in progress -Pt refusing Boost supplementation, Magic cup TID per RD  Calorie count interrupted for drain placement.  Plan:  At 18:00 Continue TPN at goal rate of 70  mL/hr Electrolytes in TPN: No change Na 150 mEq/L (max) K 50 mEq/L Ca 5 mEq/L Mg 5 mEq/L Phos 15 mmol/L Cl:Ac 1:1 No CBG checks/SSI  Resume MVI PO Monitor TPN labs on Mon/Thurs, and as needed   Luise Saint, PharmD, BCPS Secure Chat if ?s 05/24/2024 11:18 AM

## 2024-05-24 NOTE — Plan of Care (Signed)
  Problem: Education: Goal: Verbalization of understanding of the causes of altered bowel function will improve Outcome: Progressing   Problem: Bowel/Gastric: Goal: Gastrointestinal status for postoperative course will improve Outcome: Progressing   Problem: Health Behavior/Discharge Planning: Goal: Identification of community resources to assist with postoperative recovery needs will improve Outcome: Progressing   Problem: Nutritional: Goal: Will attain and maintain optimal nutritional status will improve Outcome: Progressing   Problem: Clinical Measurements: Goal: Postoperative complications will be avoided or minimized Outcome: Progressing   Problem: Respiratory: Goal: Respiratory status will improve Outcome: Progressing   Problem: Skin Integrity: Goal: Will show signs of wound healing Outcome: Progressing   Problem: Education: Goal: Knowledge of General Education information will improve Description: Including pain rating scale, medication(s)/side effects and non-pharmacologic comfort measures Outcome: Progressing   Problem: Health Behavior/Discharge Planning: Goal: Ability to manage health-related needs will improve Outcome: Progressing   Problem: Clinical Measurements: Goal: Ability to maintain clinical measurements within normal limits will improve Outcome: Progressing Goal: Will remain free from infection Outcome: Progressing Goal: Diagnostic test results will improve Outcome: Progressing Goal: Respiratory complications will improve Outcome: Progressing Goal: Cardiovascular complication will be avoided Outcome: Progressing   Problem: Activity: Goal: Risk for activity intolerance will decrease Outcome: Progressing   Problem: Coping: Goal: Level of anxiety will decrease Outcome: Progressing   Problem: Elimination: Goal: Will not experience complications related to bowel motility Outcome: Progressing Goal: Will not experience complications related to  urinary retention Outcome: Progressing   Problem: Pain Managment: Goal: General experience of comfort will improve and/or be controlled Outcome: Progressing   Problem: Safety: Goal: Ability to remain free from injury will improve Outcome: Progressing   Problem: Skin Integrity: Goal: Risk for impaired skin integrity will decrease Outcome: Progressing   Problem: Education: Goal: Ability to describe self-care measures that may prevent or decrease complications (Diabetes Survival Skills Education) will improve Outcome: Progressing Goal: Individualized Educational Video(s) Outcome: Progressing   Problem: Coping: Goal: Ability to adjust to condition or change in health will improve Outcome: Progressing   Problem: Fluid Volume: Goal: Ability to maintain a balanced intake and output will improve Outcome: Progressing   Problem: Health Behavior/Discharge Planning: Goal: Ability to identify and utilize available resources and services will improve Outcome: Progressing Goal: Ability to manage health-related needs will improve Outcome: Progressing   Problem: Metabolic: Goal: Ability to maintain appropriate glucose levels will improve Outcome: Progressing   Problem: Nutritional: Goal: Maintenance of adequate nutrition will improve Outcome: Progressing Goal: Progress toward achieving an optimal weight will improve Outcome: Progressing   Problem: Skin Integrity: Goal: Risk for impaired skin integrity will decrease Outcome: Progressing   Problem: Tissue Perfusion: Goal: Adequacy of tissue perfusion will improve Outcome: Progressing

## 2024-05-24 NOTE — TOC Progression Note (Signed)
 Transition of Care [facility]) - Progression Note    Patient Details  Name: Elizabeth Odom MRN: [id number] Date of Birth: 01-Mar-1941  [facility] CM/SW Contact  Amaryllis Junior, [place] Phone Number: 05/24/2024, 1:12 PM  Clinical Narrative:    Pt and family still deciding between [facility] vs SNF. TOC to follow up   Expected Discharge Plan: Home/Self Care Barriers to Discharge: Continued Medical Work up  Expected Discharge Plan and Services   Discharge Planning Services: CM Consult   Living arrangements for the past 2 months: Apartment                                       Social Determinants of Health (SDOH) Interventions SDOH Screenings   Food Insecurity: No Food Insecurity (05/09/2024)  Housing: Low Risk  (05/09/2024)  Transportation Needs: No Transportation Needs (05/09/2024)  Utilities: Not At Risk (05/09/2024)  Depression (PHQ2-9): Low Risk  (01/02/2024)  Social Connections: Unknown (05/09/2024)  Tobacco Use: Low Risk  (05/08/2024)    Readmission Risk Interventions    05/12/2024    2:23 PM 05/09/2024    2:46 PM 10/19/2023    6:13 PM  Readmission Risk Prevention Plan  Transportation Screening Complete Complete Complete  PCP or Specialist Appt within 5-7 Days   Complete  PCP or Specialist Appt within 3-5 Days Complete Complete   Home Care Screening   Complete  Medication Review (RN CM)   Complete  HRI or Home Care Consult Complete Complete   Social Work Consult for Recovery Care Planning/Counseling Complete Complete   Palliative Care Screening Not Applicable Not Applicable   Medication Review Oceanographer) Complete Complete

## 2024-05-24 NOTE — Progress Notes (Signed)
 Pharmacy Antibiotic Note  Kip M Linson is a 83 y.o. female who is known to pharmacy from current TPN consult.  Pharmacy has been consulted on 05/21/24 to dose vancomycin  for abdominal abscess.  Today, 05/24/2024: - Afebrile - WBC stable - Scr stable - day #6 zosyn   - Day #4 vancomycin  - Day #2 micafungin - Abdomen and back cultures both re-incubated for better growth  Plan: - Continue vancomycin  1000 mg q24h for AUC 515 - Continue Zosyn  3.375 gm IV q8h (infuse over 4 hrs) - Monitor scr closely while on vancomycin  and zosyn    _________________________________________  Height: 5' 5 (165.1 cm) Weight: 61.5 kg (135 lb 9.3 oz) IBW/kg (Calculated) : 57  Temp (24hrs), Avg:97.9 F (36.6 C), Min:97.7 F (36.5 C), Max:98.2 F (36.8 C)  Recent Labs  Lab 05/19/24 1326 05/20/24 0350 05/21/24 0331 05/22/24 0407 05/23/24 0500 05/24/24 0427  WBC 13.8* 13.8* 9.2 7.9  --  9.4  CREATININE  --  0.65 0.46 0.59 0.69 0.64    Estimated Creatinine Clearance: 47.9 mL/min (by C-G formula based on SCr of 0.64 mg/dL).    Allergies  Allergen Reactions   Elemental Sulfur Anaphylaxis      Thank you for allowing pharmacy to be a part of this patient's care.  Bernett Brill 05/24/2024 11:16 AM

## 2024-05-24 NOTE — Progress Notes (Signed)
 PROGRESS NOTE    Elizabeth Odom  ZOX:[id number] DOB: 1941/12/05 DOA: 05/08/2024 PCP: Anthon Kins, MD    Hospital course:  83 year old female with past medical history significant for remote history of A-fib evaluated with a loop recorder which was negative, prior DVT and small PE currently not on any anticoagulation because of some history of bleeding in the past had IVC filter which was subsequently removed at some point), anemia, PACs, HTN, mild cognitive impairment,  large bowel obstruction requiring colon resection and colostomy was admitted for colostomy takedown by general surgery.  Patient underwent elective robotic rectosigmoid resection, colostomy takedown with bowel anastomosis, extensive lysis of additions on 05/08/24 after preop cardiac clearance. On 05/09/24 evening, patient developed AMS after polypharmacy along with A-fib with RVR and TRH was consulted. Patient was transferred to stepdown unit. Course complicated by A-fib with RVR: Cardiology was consulted.  Subsequently, rate got controlled.  Cardiology signed off on 05/19/2024.  Eliquis  on hold.  Hospitalization also complicated by hypotension/rectal bleeding and syncope on 05/11/2024.  Patient also had postop ileus needing NG tube and TPN.  Imaging on 05/19/2024 showed possible abscesses.  She was started on IV antibiotics by general surgery on 05/20/2024.  She underwent IR guided drains placed on 05/20/2024.   Subjective:  Patient was seen with son at bedside.  She continues to state that she does not want to go to rehab.  Patient's son states they have been discussed in the family and they are all aware that she does need to go to rehab.  They are making plans for her to stay with somebody after her time in rehab.  Notes she has been eating well without increased abdominal pain or vomiting  Exam:  General: Patient appears to have more energy, sitting up in bed having lunch Eyes: sclera anicteric, conjuctiva mild injection  bilaterally CVS: S1-S2, regular  Respiratory:  decreased air entry bilaterally secondary to decreased inspiratory effort, rales at bases  GI: Distended, soft, diffuse tenderness to palpation LE: No edema  Assessment & Plan:  Ileus Possible intra-abdominal abscess S/p elective rectosigmoid resection with bowel anastomosis/lysis of adhesions Severe protein calorie malnutrition with albumin  < 1.5 Patient is followed closely by general surgery Ileus appears to be resolving, patient advance to bland soft solids--she continues on TPN, this is to be tapered when she is adequately able to maintain p.o.'s, nutrition is following. Patient is on vancomycin /Zosyn  General surgery added micafungin as cultures are showing rare yeast Alk phos remains elevated  Urinary retention Foley catheter Voiding trial per general surgery Previously failed voiding trial, continue Flomax   Persistent A-fib Continue Toprol -XL Cardiology signed off on 6/9, outpatient follow-up recommended Eliquis  restarted 6/12  ABLA H&H have been stable for several days post transfusion with 4 units PRBC  S/p syncope 6/1, possibly micturition syncope While patient was on bedside commode Echo with preserved EF  Acute metabolic encephalopathy--resolved    DVT prophylaxis: SCD Code Status: Full Family Communication: Attempted to speak with with granddaughter Disposition: TBD             Pressure Injury 10/08/23 Buttocks Right;Mid Stage 2 -  Partial thickness loss of dermis presenting as a shallow open injury with a red, pink wound bed without slough. (Active)  10/08/23 0800  Location: Buttocks  Location Orientation: Right;Mid  Staging: Stage 2 -  Partial thickness loss of dermis presenting as a shallow open injury with a red, pink wound bed without slough.  Wound Description (Comments):   DO NOT USE:  Present on Admission:      Diet Orders (From admission, onward)     Start     Ordered   05/23/24 0752   Diet regular Room service appropriate? Yes; Fluid consistency: Thin  Diet effective now       Comments: If patient experiences nausea, vomiting, worsening pain, or worsening distention; then, make NPO with sips/ice chips only until seen by MD  Question Answer Comment  Room service appropriate? Yes   Fluid consistency: Thin      05/23/24 0753   05/08/24 0000  Diet - low sodium heart healthy       Comments: Start with a bland diet such as soups, liquids, starchy foods, low fat foods, etc. the first few days at home. Gradually advance to a solid, low-fat, high fiber diet by the end of the first week at home.   Add a fiber supplement to your diet (Metamucil, etc) If you feel full, bloated, or constipated, stay on a full liquid or pureed/blenderized diet for a few days until you feel better and are no longer constipated.   05/08/24 1149            Objective: Vitals:   05/23/24 2118 05/24/24 0500 05/24/24 0508 05/24/24 1335  BP: 121/76  117/60 (!) 114/52  Pulse: 85  87 90  Resp: 16  14 14   Temp: 97.9 F (36.6 C)  97.7 F (36.5 C) 98 F (36.7 C)  TempSrc: Oral  Oral Oral  SpO2: 99%  97% 99%  Weight:  61.5 kg    Height:        Intake/Output Summary (Last 24 hours) at 05/24/2024 1848 Last data filed at 05/24/2024 1750 Gross per 24 hour  Intake 2201.79 ml  Output 35 ml  Net 2166.79 ml   Filed Weights   05/20/24 0500 05/21/24 0500 05/24/24 0500  Weight: 61.2 kg 61.1 kg 61.5 kg    Scheduled Meds:  acetaminophen   500 mg Oral TID WC & HS   apixaban   2.5 mg Oral BID   ascorbic acid   250 mg Oral Daily   Chlorhexidine  Gluconate Cloth  6 each Topical Daily   cyanocobalamin   1,000 mcg Oral Daily   feeding supplement  1 Container Oral TID BM   gabapentin   300 mg Oral QHS   liver oil-zinc  oxide   Topical BID   loperamide   2 mg Oral BID   metoprolol  succinate  12.5 mg Oral Daily   multivitamin with minerals  1 tablet Oral Daily   pantoprazole   40 mg Oral Daily   polycarbophil   625 mg Oral Daily   sodium chloride  flush  10-40 mL Intracatheter Q12H   sodium chloride  flush  3 mL Intravenous Q12H   sodium chloride  flush  5 mL Intracatheter Q8H   tamsulosin   0.4 mg Oral QPC supper   Continuous Infusions:  sodium chloride      micafungin (MYCAMINE) 100 mg in sodium chloride  0.9 % 100 mL IVPB 100 mg (05/24/24 1156)   piperacillin -tazobactam (ZOSYN )  IV 3.375 g (05/24/24 1401)   TPN ADULT (ION) 70 mL/hr at 05/24/24 1725   vancomycin  1,000 mg (05/24/24 0936)    Nutritional status Signs/Symptoms: severe fat depletion, severe muscle depletion Interventions: Refer to RD note for recommendations, TPN Body mass index is 22.56 kg/m.  Data Reviewed:   CBC: Recent Labs  Lab 05/19/24 1326 05/20/24 0350 05/21/24 0331 05/22/24 0407 05/24/24 0427  WBC 13.8* 13.8* 9.2 7.9 9.4  HGB 9.7* 9.7* 9.7* 9.4* 9.3*  HCT 30.2* 30.6* 31.4* 30.6* 30.3*  MCV 89.9 92.4 92.1 94.2 94.1  PLT 491* 541* 600* 635* 635*   Basic Metabolic Panel: Recent Labs  Lab 05/18/24 0318 05/19/24 0337 05/20/24 0350 05/21/24 0331 05/22/24 0407 05/23/24 0500 05/24/24 0427  NA 134* 132* 134* 136 136  --  132*  K 4.0 4.3 4.1 4.2 4.1  --  4.0  CL 101 99 101 104 104  --  101  CO2 24 24 24 25 24   --  25  GLUCOSE 150* 124* 129* 131* 121*  --  118*  BUN 25* 22 23 15  29*  --  30*  CREATININE 0.59 0.63 0.65 0.46 0.59 0.69 0.64  CALCIUM  7.6* 7.5* 7.7* 7.9* 7.8*  --  7.8*  MG 2.0 2.1  --   --  2.0  --  1.9  PHOS 2.5 3.5  --   --  3.8  --  3.2   GFR: Estimated Creatinine Clearance: 47.9 mL/min (by C-G formula based on SCr of 0.64 mg/dL). Liver Function Tests: Recent Labs  Lab 05/19/24 0337 05/20/24 0350 05/22/24 0407 05/24/24 0427  AST 54* 32 42* 26  ALT 65* 47* 50* 39  ALKPHOS 245* 269* 265* 226*  BILITOT 1.4* 1.2 0.8 1.0  PROT 5.5* 5.8* 6.0* 6.2*  ALBUMIN  <1.5* <1.5* <1.5* <1.5*   No results for input(s): LIPASE, AMYLASE in the last 168 hours. No results for input(s): AMMONIA in  the last 168 hours. Coagulation Profile: Recent Labs  Lab 05/19/24 2054  INR 1.2   Cardiac Enzymes: No results for input(s): CKTOTAL, CKMB, CKMBINDEX, TROPONINI in the last 168 hours. BNP (last 3 results) No results for input(s): PROBNP in the last 8760 hours. HbA1C: No results for input(s): HGBA1C in the last 72 hours. CBG: Recent Labs  Lab 05/20/24 0749 05/20/24 1816 05/21/24 0209 05/21/24 0754 05/21/24 1615  GLUCAP 131* 130* 144* 164* 134*   Lipid Profile: No results for input(s): CHOL, HDL, LDLCALC, TRIG, CHOLHDL, LDLDIRECT in the last 72 hours. Thyroid  Function Tests: No results for input(s): TSH, T4TOTAL, FREET4, T3FREE, THYROIDAB in the last 72 hours. Anemia Panel: No results for input(s): VITAMINB12, FOLATE, FERRITIN, TIBC, IRON, RETICCTPCT in the last 72 hours. Sepsis Labs: No results for input(s): PROCALCITON, LATICACIDVEN in the last 168 hours.  Recent Results (from the past 240 hours)  Aerobic/Anaerobic Culture w Gram Stain (surgical/deep wound)     Status: None (Preliminary result)   Collection Time: 05/22/24  3:59 PM   Specimen: Abdomen  Result Value Ref Range Status   Specimen Description   Final    ABDOMEN Performed at [facility], [address] [place], [place] [address]    Special Requests   Final    ABDOMINAL DRAIN Performed at [facility], [address] [place], [place] [address]    Gram Stain   Final    ABUNDANT WBC PRESENT, PREDOMINANTLY PMN RARE GRAM POSITIVE COCCI RARE YEAST WITH PSEUDOHYPHAE Performed at [facility], [address], [place], [place] [address]    Culture   Final    CULTURE REINCUBATED FOR BETTER GROWTH NO ANAEROBES ISOLATED; CULTURE IN PROGRESS FOR 5 DAYS    Report Status PENDING  Incomplete  Aerobic/Anaerobic Culture w Gram Stain (surgical/deep wound)     Status: None (Preliminary result)   Collection Time: 05/22/24   3:59 PM   Specimen: Back  Result Value Ref Range Status   Specimen Description   Final    BACK Performed  at [facility], [address] [place], [place] [address]    Special Requests   Final    TRANSGLUTEAL PERCUTANEOUS IR DRAIN Performed at [facility], [address], [place], [place] [address]    Gram Stain   Final    NO WBC SEEN ABUNDANT GRAM NEGATIVE RODS RARE GRAM POSITIVE COCCI IN PAIRS RARE BUDDING YEAST SEEN Performed at [facility], [address] [place], [place] [address]    Culture   Final    CULTURE REINCUBATED FOR BETTER GROWTH NO ANAEROBES ISOLATED; CULTURE IN PROGRESS FOR 5 DAYS    Report Status PENDING  Incomplete         Radiology Studies: No results found.         LOS: 16 days   Time spent= 35 mins    Magdalene School, MD [facility]  If 7PM-7AM, please contact night-coverage  05/24/2024, 6:48 PM

## 2024-05-24 NOTE — Progress Notes (Signed)
 16 Days Post-Op   Subjective/Chief Complaint: No acute events. Having some incontinence which is unchanged.     Objective: Vital signs in last 24 hours: Temp:  [97.7 F (36.5 C)-98.2 F (36.8 C)] 97.7 F (36.5 C) (06/14 0508) Pulse Rate:  [78-87] 87 (06/14 0508) Resp:  [14-18] 14 (06/14 0508) BP: (100-121)/(56-76) 117/60 (06/14 0508) SpO2:  [97 %-99 %] 97 % (06/14 0508) Weight:  [61.5 kg] 61.5 kg (06/14 0500) Last BM Date : 05/24/24  Intake/Output from previous day: 06/13 0701 - 06/14 0700 In: 1503.9 [P.O.:510.1; I.V.:737.8; IV Piggyback:251] Out: 50 [Drains:50] Intake/Output this shift: Total I/O In: 480 [P.O.:480] Out: 10 [Drains:10]  Alert and oriented Breathing comfortably Soft, non tender, non distended Drains small amount stool  Lab Results:  Recent Labs    05/22/24 0407 05/24/24 0427  WBC 7.9 9.4  HGB 9.4* 9.3*  HCT 30.6* 30.3*  PLT 635* 635*   BMET Recent Labs    05/22/24 0407 05/23/24 0500 05/24/24 0427  NA 136  --  132*  K 4.1  --  4.0  CL 104  --  101  CO2 24  --  25  GLUCOSE 121*  --  118*  BUN 29*  --  30*  CREATININE 0.59 0.69 0.64  CALCIUM  7.8*  --  7.8*   PT/INR No results for input(s): LABPROT, INR in the last 72 hours. ABG No results for input(s): PHART, HCO3 in the last 72 hours.  Invalid input(s): PCO2, PO2  Studies/Results: No results found.  Anti-infectives: Anti-infectives (From admission, onward)    Start     Dose/Rate Route Frequency Ordered Stop   05/23/24 1400  piperacillin -tazobactam (ZOSYN ) IVPB 3.375 g        3.375 g 12.5 mL/hr over 240 Minutes Intravenous Every 8 hours 05/23/24 0804     05/23/24 1200  micafungin (MYCAMINE) 100 mg in sodium chloride  0.9 % 100 mL IVPB        100 mg 105 mL/hr over 1 Hours Intravenous Every 24 hours 05/23/24 0755 06/06/24 1159   05/22/24 0800  vancomycin  (VANCOCIN ) IVPB 1000 mg/200 mL premix        1,000 mg 200 mL/hr over 60 Minutes Intravenous Every 24 hours  05/21/24 0634     05/21/24 0730  vancomycin  (VANCOREADY) IVPB 1250 mg/250 mL        1,250 mg 166.7 mL/hr over 90 Minutes Intravenous  Once 05/21/24 0634 05/21/24 1043   05/19/24 1400  piperacillin -tazobactam (ZOSYN ) IVPB 3.375 g  Status:  Discontinued        3.375 g 12.5 mL/hr over 240 Minutes Intravenous Every 8 hours 05/19/24 1303 05/23/24 0804   05/09/24 0900  erythromycin  250 mg in sodium chloride  0.9 % 100 mL IVPB        250 mg 100 mL/hr over 60 Minutes Intravenous Every 8 hours 05/09/24 0732 05/11/24 0044   05/08/24 2200  cefoTEtan  (CEFOTAN ) 2 g in sodium chloride  0.9 % 100 mL IVPB        2 g 200 mL/hr over 30 Minutes Intravenous Every 12 hours 05/08/24 1759 05/09/24 0749   05/08/24 1400  neomycin  (MYCIFRADIN ) tablet 1,000 mg  Status:  Discontinued       Placed in And Linked Group   1,000 mg Oral 3 times per day 05/08/24 1119 05/08/24 1120   05/08/24 1400  metroNIDAZOLE  (FLAGYL ) tablet 1,000 mg  Status:  Discontinued       Placed in And Linked Group   1,000 mg Oral 3 times  per day 05/08/24 1119 05/08/24 1120   05/08/24 1130  cefoTEtan  (CEFOTAN ) 2 g in sodium chloride  0.9 % 100 mL IVPB        2 g 200 mL/hr over 30 Minutes Intravenous On call to O.R. 05/08/24 1119 05/09/24 0749       Assessment/Plan: s/p Procedure(s) with comments: CLOSURE, COLOSTOMY, ROBOT-ASSISTED -ROBOTIC RECTOSIGMOID RESECTION (LAR) -TAKEDOWN OF END COLOSTOMY WITH ANASTOMOSIS -RESECTION OF SMALL INTESTINE WITH ANASTOMOSIS -SMALL BOWEL REPAIR -LYSIS OF ADHESIONS x 115 MINUTES (66% OF CASE),  -INTRAOPERATIVE ASSESSMENT OF TISSUE VASCULAR PERFUSION USING ICG (indocyanine green ) IMMUNOFLUORESCENCE,  -TRANSVERSUS ABDOMINIS PLANE (TAP) BLOCK - BILATERAL (N/A) - ROBOTIC OSTOMY TAKEDOWN LYSIS, ADHESIONS, LAPAROSCOPIC (N/A) - LYSIS OF ADHESIONS SIGMOIDOSCOPY, FLEXIBLE (N/A) CYSTOSCOPY WITH INDOCYANINE GREEN  IMAGING (ICG) (N/A)  Continue TPN Continue drains Food as tolerated Monitor electrolytes   LOS: 16 days     Lockie Rima 05/24/2024

## 2024-05-25 DIAGNOSIS — K5790 Diverticulosis of intestine, part unspecified, without perforation or abscess without bleeding: Secondary | ICD-10-CM | POA: Diagnosis not present

## 2024-05-25 LAB — CBC
HCT: 28.8 % — ABNORMAL LOW (ref 36.0–46.0)
Hemoglobin: 8.6 g/dL — ABNORMAL LOW (ref 12.0–15.0)
MCH: 28.3 pg (ref 26.0–34.0)
MCHC: 29.9 g/dL — ABNORMAL LOW (ref 30.0–36.0)
MCV: 94.7 fL (ref 80.0–100.0)
Platelets: 630 10*3/uL — ABNORMAL HIGH (ref 150–400)
RBC: 3.04 MIL/uL — ABNORMAL LOW (ref 3.87–5.11)
RDW: 16.5 % — ABNORMAL HIGH (ref 11.5–15.5)
WBC: 8.1 10*3/uL (ref 4.0–10.5)
nRBC: 0 % (ref 0.0–0.2)

## 2024-05-25 LAB — CREATININE, SERUM
Creatinine, Ser: 0.62 mg/dL (ref 0.44–1.00)
GFR, Estimated: >60 mL/min (ref >60)

## 2024-05-25 LAB — POTASSIUM: Potassium: 3.9 mmol/L (ref 3.5–5.1)

## 2024-05-25 MED ORDER — PSYLLIUM 95 % PO PACK
1.0000 | PACK | Freq: Three times a day (TID) | ORAL | Status: DC
  Administered 2024-05-25: 1 via ORAL
  Filled 2024-05-25: qty 1

## 2024-05-25 MED ORDER — PSYLLIUM 95 % PO PACK: 1.0000 | PACK | Freq: Every day | ORAL | Status: DC

## 2024-05-25 MED ORDER — TRAVASOL 10 % IV SOLN
INTRAVENOUS | Status: AC
  Filled 2024-05-25: qty 940.8

## 2024-05-25 NOTE — Progress Notes (Signed)
  17 Days Post-Op   Chief Complaint/Subjective: Pain about the same, possible improvement, she is eating some things though breakfast smell is making her upset. She has been able to get out of bed  Objective: Vital signs in last 24 hours: Temp:  [98 F (36.7 C)-98.4 F (36.9 C)] 98.4 F (36.9 C) (06/15 0611) Pulse Rate:  [82-90] 82 (06/15 0611) Resp:  [14-17] 17 (06/15 0611) BP: (106-115)/(52-57) 106/55 (06/15 0611) SpO2:  [97 %-99 %] 97 % (06/15 0611) Weight:  [62.3 kg] 62.3 kg (06/15 0500) Last BM Date : 05/25/24 Intake/Output from previous day: 06/14 0701 - 06/15 0700 In: 2365.6 [P.O.:1080; I.V.:878; IV Piggyback:407.6] Out: 25 [Drains:25]  PE: Gen: NAd Resp: nonlabored Card: RRR Abd: soft, slight distension vs edema, drains with dark output, no guarding  Lab Results:  Recent Labs    05/24/24 0427 05/25/24 0300  WBC 9.4 8.1  HGB 9.3* 8.6*  HCT 30.3* 28.8*  PLT 635* 630*   Recent Labs    05/24/24 0427 05/25/24 0300  NA 132*  --   K 4.0 3.9  CL 101  --   CO2 25  --   GLUCOSE 118*  --   BUN 30*  --   CREATININE 0.64 0.62  CALCIUM  7.8*  --    No results for input(s): LABPROT, INR in the last 72 hours.    Component Value Date/Time   NA 132 (L) 05/24/2024 0427   NA 141 04/08/2024 1025   K 3.9 05/25/2024 0300   CL 101 05/24/2024 0427   CO2 25 05/24/2024 0427   GLUCOSE 118 (H) 05/24/2024 0427   BUN 30 (H) 05/24/2024 0427   BUN 17 04/08/2024 1025   CREATININE 0.62 05/25/2024 0300   CALCIUM  7.8 (L) 05/24/2024 0427   PROT 6.2 (L) 05/24/2024 0427   PROT 7.6 04/08/2024 1025   ALBUMIN  <1.5 (L) 05/24/2024 0427   ALBUMIN  4.7 04/08/2024 1025   AST 26 05/24/2024 0427   ALT 39 05/24/2024 0427   ALKPHOS 226 (H) 05/24/2024 0427   BILITOT 1.0 05/24/2024 0427   BILITOT 0.9 04/08/2024 1025   GFRNONAA >60 05/25/2024 0300   GFRAA >60 04/10/2016 1352    Assessment/Plan  s/p Procedure(s): CLOSURE, COLOSTOMY, ROBOT-ASSISTED -ROBOTIC RECTOSIGMOID RESECTION  (LAR) -TAKEDOWN OF END COLOSTOMY WITH ANASTOMOSIS -RESECTION OF SMALL INTESTINE WITH ANASTOMOSIS -SMALL BOWEL REPAIR -LYSIS OF ADHESIONS x 115 MINUTES (66% OF CASE),  -INTRAOPERATIVE ASSESSMENT OF TISSUE VASCULAR PERFUSION USING ICG (indocyanine green ) IMMUNOFLUORESCENCE,  -TRANSVERSUS ABDOMINIS PLANE (TAP) BLOCK - BILATERAL LYSIS, ADHESIONS, LAPAROSCOPIC SIGMOIDOSCOPY, FLEXIBLE CYSTOSCOPY WITH INDOCYANINE GREEN  IMAGING (ICG) 05/08/2024    FEN - continue TPN, reg diet, boost, calorie count ongoing VTE - apixaban  ID - zosyn , micafungin, vancomycin  Disposition - inpatient, encouraged ambulation and oral intake to indicate improving abdominal symptoms and to help for severe protein calorie malnutrition   LOS: 17 days   I reviewed last 24 h vitals and pain scores, last 48 h intake and output, last 24 h labs and trends, and last 24 h imaging results.  This care required moderate level of medical decision making.   Alphonso Aschoff [facility] at [facility] 05/25/2024, 8:36 AM Please see Amion for pager number during day hours 7:00am-4:30pm or 7:00am -11:30am on weekends

## 2024-05-25 NOTE — Progress Notes (Signed)
 PROGRESS NOTE    Elizabeth Odom  GMW:[id number] DOB: [birth year] DOA: 05/08/2024 PCP: Anthon Kins, MD    Hospital course:  83 year old female with past medical history significant for remote history of A-fib evaluated with a loop recorder which was negative, prior DVT and small PE currently not on any anticoagulation because of some history of bleeding in the past had IVC filter which was subsequently removed at some point), anemia, PACs, HTN, mild cognitive impairment,  large bowel obstruction requiring colon resection and colostomy was admitted for colostomy takedown by general surgery.  Patient underwent elective robotic rectosigmoid resection, colostomy takedown with bowel anastomosis, extensive lysis of additions on 05/08/24 after preop cardiac clearance. On 05/09/24 evening, patient developed AMS after polypharmacy along with A-fib with RVR and TRH was consulted. Patient was transferred to stepdown unit. Course complicated by A-fib with RVR: Cardiology was consulted.  Subsequently, rate got controlled.  Cardiology signed off on 05/19/2024.  Eliquis  on hold.  Hospitalization also complicated by hypotension/rectal bleeding and syncope on 05/11/2024.  Patient also had postop ileus needing NG tube and TPN.  Imaging on 05/19/2024 showed possible abscesses.  She was started on IV antibiotics by general surgery on 05/20/2024.  She underwent IR guided drains placed on 05/20/2024.   Subjective:  Patient was seen with son at bedside.  She continues to state that she does not want to go to rehab.  Patient's son states they have been discussed in the family and they are all aware that she does need to go to rehab.  They are making plans for her to stay with somebody after her time in rehab.  Notes she has been eating well without increased abdominal pain or vomiting  Exam:  General: Patient appears to have more energy, sitting up in bed having lunch Eyes: sclera anicteric, conjuctiva mild injection  bilaterally CVS: S1-S2, regular  Respiratory:  decreased air entry bilaterally secondary to decreased inspiratory effort, rales at bases  GI: Distended, soft, diffuse tenderness to palpation LE: No edema  Assessment & Plan:  Ileus Possible intra-abdominal abscess S/p elective rectosigmoid resection with bowel anastomosis/lysis of adhesions Severe protein calorie malnutrition with albumin  < 1.5 Patient is followed closely by general surgery Ileus appears to be resolving, patient advance to bland soft solids--she continues on TPN, this is to be tapered when she is adequately able to maintain p.o.'s, nutrition is following. Patient is on vancomycin /Zosyn   General surgery added micafungin as cultures are showing rare yeast Alk phos remains elevated  Urinary retention Foley catheter Voiding trial per general surgery Previously failed voiding trial, continue Flomax   Persistent A-fib Continue Toprol -XL Cardiology signed off on 6/9, outpatient follow-up recommended Eliquis  restarted 6/12  ABLA H&H have been stable for several days post transfusion with 4 units PRBC  S/p syncope 6/1, possibly micturition syncope While patient was on bedside commode Echo with preserved EF  Acute metabolic encephalopathy--resolved    DVT prophylaxis: SCD Code Status: Full Family Communication: Spoke with patient's son who was visiting Disposition: TBD             Pressure Injury 10/08/23 Buttocks Right;Mid Stage 2 -  Partial thickness loss of dermis presenting as a shallow open injury with a red, pink wound bed without slough. (Active)  10/08/23 0800  Location: Buttocks  Location Orientation: Right;Mid  Staging: Stage 2 -  Partial thickness loss of dermis presenting as a shallow open injury with a red, pink wound bed without slough.  Wound Description (Comments):   DO  NOT USE:  Present on Admission:      Diet Orders (From admission, onward)     Start     Ordered   05/23/24 0752   Diet regular Room service appropriate? Yes; Fluid consistency: Thin  Diet effective now       Comments: If patient experiences nausea, vomiting, worsening pain, or worsening distention; then, make NPO with sips/ice chips only until seen by MD  Question Answer Comment  Room service appropriate? Yes   Fluid consistency: Thin      05/23/24 0753   05/08/24 0000  Diet - low sodium heart healthy       Comments: Start with a bland diet such as soups, liquids, starchy foods, low fat foods, etc. the first few days at home. Gradually advance to a solid, low-fat, high fiber diet by the end of the first week at home.   Add a fiber supplement to your diet (Metamucil, etc) If you feel full, bloated, or constipated, stay on a full liquid or pureed/blenderized diet for a few days until you feel better and are no longer constipated.   05/08/24 1149            Objective: Vitals:   05/25/24 0500 05/25/24 0611 05/25/24 1003 05/25/24 1317  BP:  (!) 106/55 111/63 (!) 107/56  Pulse:  82 81 79  Resp:  17  18  Temp:  98.4 F (36.9 C)  98.7 F (37.1 C)  TempSrc:  Oral  Oral  SpO2:  97% 98% 97%  Weight: 62.3 kg     Height:        Intake/Output Summary (Last 24 hours) at 05/25/2024 1620 Last data filed at 05/25/2024 1317 Gross per 24 hour  Intake 1805.6 ml  Output 25 ml  Net 1780.6 ml   Filed Weights   05/21/24 0500 05/24/24 0500 05/25/24 0500  Weight: 61.1 kg 61.5 kg 62.3 kg    Scheduled Meds:  acetaminophen   500 mg Oral TID WC & HS   apixaban   2.5 mg Oral BID   ascorbic acid   250 mg Oral Daily   Chlorhexidine  Gluconate Cloth  6 each Topical Daily   cyanocobalamin   1,000 mcg Oral Daily   feeding supplement  1 Container Oral TID BM   gabapentin   300 mg Oral QHS   liver oil-zinc  oxide   Topical BID   loperamide   2 mg Oral BID   metoprolol  succinate  12.5 mg Oral Daily   multivitamin with minerals  1 tablet Oral Daily   pantoprazole   40 mg Oral Daily   polycarbophil  625 mg Oral Daily    sodium chloride  flush  10-40 mL Intracatheter Q12H   sodium chloride  flush  3 mL Intravenous Q12H   sodium chloride  flush  5 mL Intracatheter Q8H   tamsulosin   0.4 mg Oral QPC supper   Continuous Infusions:  sodium chloride      micafungin (MYCAMINE) 100 mg in sodium chloride  0.9 % 100 mL IVPB 100 mg (05/25/24 1142)   piperacillin -tazobactam (ZOSYN )  IV 3.375 g (05/25/24 1330)   TPN ADULT (ION) 70 mL/hr at 05/25/24 0200   TPN ADULT (ION)     vancomycin  1,000 mg (05/25/24 0745)    Nutritional status Signs/Symptoms: severe fat depletion, severe muscle depletion Interventions: Refer to RD note for recommendations, TPN Body mass index is 22.86 kg/m.  Data Reviewed:   CBC: Recent Labs  Lab 05/20/24 0350 05/21/24 0331 05/22/24 0407 05/24/24 0427 05/25/24 0300  WBC 13.8* 9.2 7.9 9.4  8.1  HGB 9.7* 9.7* 9.4* 9.3* 8.6*  HCT 30.6* 31.4* 30.6* 30.3* 28.8*  MCV 92.4 92.1 94.2 94.1 94.7  PLT 541* 600* 635* 635* 630*   Basic Metabolic Panel: Recent Labs  Lab 05/19/24 0337 05/20/24 0350 05/21/24 0331 05/22/24 0407 05/23/24 0500 05/24/24 0427 05/25/24 0300  NA 132* 134* 136 136  --  132*  --   K 4.3 4.1 4.2 4.1  --  4.0 3.9  CL 99 101 104 104  --  101  --   CO2 24 24 25 24   --  25  --   GLUCOSE 124* 129* 131* 121*  --  118*  --   BUN 22 23 15  29*  --  30*  --   CREATININE 0.63 0.65 0.46 0.59 0.69 0.64 0.62  CALCIUM  7.5* 7.7* 7.9* 7.8*  --  7.8*  --   MG 2.1  --   --  2.0  --  1.9  --   PHOS 3.5  --   --  3.8  --  3.2  --    GFR: Estimated Creatinine Clearance: 47.9 mL/min (by C-G formula based on SCr of 0.62 mg/dL). Liver Function Tests: Recent Labs  Lab 05/19/24 0337 05/20/24 0350 05/22/24 0407 05/24/24 0427  AST 54* 32 42* 26  ALT 65* 47* 50* 39  ALKPHOS 245* 269* 265* 226*  BILITOT 1.4* 1.2 0.8 1.0  PROT 5.5* 5.8* 6.0* 6.2*  ALBUMIN  <1.5* <1.5* <1.5* <1.5*   No results for input(s): LIPASE, AMYLASE in the last 168 hours. No results for input(s):  AMMONIA in the last 168 hours. Coagulation Profile: Recent Labs  Lab 05/19/24 2054  INR 1.2   Cardiac Enzymes: No results for input(s): CKTOTAL, CKMB, CKMBINDEX, TROPONINI in the last 168 hours. BNP (last 3 results) No results for input(s): PROBNP in the last 8760 hours. HbA1C: No results for input(s): HGBA1C in the last 72 hours. CBG: Recent Labs  Lab 05/20/24 0749 05/20/24 1816 05/21/24 0209 05/21/24 0754 05/21/24 1615  GLUCAP 131* 130* 144* 164* 134*   Lipid Profile: No results for input(s): CHOL, HDL, LDLCALC, TRIG, CHOLHDL, LDLDIRECT in the last 72 hours. Thyroid  Function Tests: No results for input(s): TSH, T4TOTAL, FREET4, T3FREE, THYROIDAB in the last 72 hours. Anemia Panel: No results for input(s): VITAMINB12, FOLATE, FERRITIN, TIBC, IRON, RETICCTPCT in the last 72 hours. Sepsis Labs: No results for input(s): PROCALCITON, LATICACIDVEN in the last 168 hours.  Recent Results (from the past 240 hours)  Aerobic/Anaerobic Culture w Gram Stain (surgical/deep wound)     Status: None (Preliminary result)   Collection Time: 05/22/24  3:59 PM   Specimen: Abdomen  Result Value Ref Range Status   Specimen Description   Final    ABDOMEN Performed at [facility], [address], [place], [place] [address]    Special Requests   Final    ABDOMINAL DRAIN Performed at [facility], [address], [place], [place] [address]    Gram Stain   Final    ABUNDANT WBC PRESENT, PREDOMINANTLY PMN RARE GRAM POSITIVE COCCI RARE YEAST WITH PSEUDOHYPHAE Performed at [facility], [address] [place], [place] [address]    Culture   Final    MODERATE PSEUDOMONAS AERUGINOSA SUSCEPTIBILITIES TO FOLLOW MODERATE CANDIDA ALBICANS NO ANAEROBES ISOLATED; CULTURE IN PROGRESS FOR 5 DAYS    Report Status PENDING  Incomplete  Aerobic/Anaerobic Culture w Gram Stain (surgical/deep wound)      Status: None (Preliminary result)  Collection Time: 05/22/24  3:59 PM   Specimen: Back  Result Value Ref Range Status   Specimen Description   Final    BACK Performed at [facility], [address] [place], [place] [address]    Special Requests   Final    TRANSGLUTEAL PERCUTANEOUS IR DRAIN Performed at [facility], [address] [place], [place] [address]    Gram Stain   Final    NO WBC SEEN ABUNDANT GRAM NEGATIVE RODS RARE GRAM POSITIVE COCCI IN PAIRS RARE BUDDING YEAST SEEN    Culture   Final    ABUNDANT PSEUDOMONAS AERUGINOSA ABUNDANT STAPHYLOCOCCUS AUREUS ABUNDANT ENTEROCOCCUS FAECALIS ABUNDANT CANDIDA ALBICANS SUSCEPTIBILITIES TO FOLLOW Performed at [facility], [address], [place], [place] [address]    Report Status PENDING  Incomplete         Radiology Studies: No results found.         LOS: 17 days   Time spent= 35 mins    Magdalene School, MD [facility]  If 7PM-7AM, please contact night-coverage  05/25/2024, 4:20 PM

## 2024-05-25 NOTE — Plan of Care (Signed)
  Problem: Education: Goal: Verbalization of understanding of the causes of altered bowel function will improve Outcome: Progressing   Problem: Bowel/Gastric: Goal: Gastrointestinal status for postoperative course will improve Outcome: Progressing   Problem: Health Behavior/Discharge Planning: Goal: Identification of community resources to assist with postoperative recovery needs will improve Outcome: Progressing   Problem: Nutritional: Goal: Will attain and maintain optimal nutritional status will improve Outcome: Progressing   Problem: Clinical Measurements: Goal: Postoperative complications will be avoided or minimized Outcome: Progressing   Problem: Respiratory: Goal: Respiratory status will improve Outcome: Progressing   Problem: Skin Integrity: Goal: Will show signs of wound healing Outcome: Progressing   Problem: Education: Goal: Knowledge of General Education information will improve Description: Including pain rating scale, medication(s)/side effects and non-pharmacologic comfort measures Outcome: Progressing   Problem: Health Behavior/Discharge Planning: Goal: Ability to manage health-related needs will improve Outcome: Progressing   Problem: Clinical Measurements: Goal: Ability to maintain clinical measurements within normal limits will improve Outcome: Progressing Goal: Will remain free from infection Outcome: Progressing Goal: Diagnostic test results will improve Outcome: Progressing Goal: Respiratory complications will improve Outcome: Progressing Goal: Cardiovascular complication will be avoided Outcome: Progressing   Problem: Activity: Goal: Risk for activity intolerance will decrease Outcome: Progressing   Problem: Coping: Goal: Level of anxiety will decrease Outcome: Progressing   Problem: Elimination: Goal: Will not experience complications related to bowel motility Outcome: Progressing Goal: Will not experience complications related to  urinary retention Outcome: Progressing   Problem: Pain Managment: Goal: General experience of comfort will improve and/or be controlled Outcome: Progressing   Problem: Safety: Goal: Ability to remain free from injury will improve Outcome: Progressing   Problem: Skin Integrity: Goal: Risk for impaired skin integrity will decrease Outcome: Progressing   Problem: Education: Goal: Ability to describe self-care measures that may prevent or decrease complications (Diabetes Survival Skills Education) will improve Outcome: Progressing Goal: Individualized Educational Video(s) Outcome: Progressing   Problem: Coping: Goal: Ability to adjust to condition or change in health will improve Outcome: Progressing   Problem: Fluid Volume: Goal: Ability to maintain a balanced intake and output will improve Outcome: Progressing   Problem: Health Behavior/Discharge Planning: Goal: Ability to identify and utilize available resources and services will improve Outcome: Progressing Goal: Ability to manage health-related needs will improve Outcome: Progressing   Problem: Metabolic: Goal: Ability to maintain appropriate glucose levels will improve Outcome: Progressing   Problem: Nutritional: Goal: Maintenance of adequate nutrition will improve Outcome: Progressing Goal: Progress toward achieving an optimal weight will improve Outcome: Progressing   Problem: Skin Integrity: Goal: Risk for impaired skin integrity will decrease Outcome: Progressing   Problem: Tissue Perfusion: Goal: Adequacy of tissue perfusion will improve Outcome: Progressing

## 2024-05-25 NOTE — Progress Notes (Signed)
 PHARMACY - TOTAL PARENTERAL NUTRITION CONSULT NOTE   Indication: Prolonged ileus  Patient Measurements: Height: 5' 5 (165.1 cm) Weight: 62.3 kg (137 lb 5.6 oz) IBW/kg (Calculated) : 57 TPN AdjBW (KG): 61.3 Body mass index is 22.86 kg/m.  Assessment: 7 yoF with history of diverticulitis with perforation and abscess. 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 She requested ostomy takedown and underwent LOA, colostomy takedown, small bowel resection with anastomosis on 5/29. Pharmacy is consulted to dose TPN starting 6/2 for postop ileus.  Now with anastomotic leak complicated by multiple-intraabdominal abscesses.  Glucose / Insulin : no Hx DM, A1c 5.1% - CBG check and SSI discontinued on 6/11 Electrolytes: All WNL, including CorrCa (9.8) Renal: SCr <1, BUN increased Hepatic: LFTs now WNL. Alk Phos elevated but improved -Albumin  low. T.bili, TG WNL I/O: PO intake, 1080 mL yesterday -UOP: not documented, 9x stools, 6x urine occurrences  -No mIVF GI Imaging:  - 6/9 CT: [name] dehiscence along the rectal anastomosis, free air and multiple fluid collections GI Surgeries / Procedures: - 5/29: LAR, SBR, colostomy takedown, LOA - 6/10 drains placed x 2 in IR (midline abd, transgluteal)  Central access: Double lumen PICC placed 6/2 TPN start date: 6/2  Nutritional Goals: - Goal TPN rate is 70 mL/hr (provides 94 g of protein and 1680 kcals per day)  RD Assessment:  Estimated Needs Total Energy Estimated Needs: 1600-1750 kcals Total Protein Estimated Needs: 80-95 grams Total Fluid Estimated Needs: >/= 1.6L  Current Nutrition:  -TPN at goal -Diet advanced to soft - Calorie count in progress -Pt refusing Boost supplementation, Magic cup TID per RD  Calorie count interrupted for drain placement.  Plan:  At 18:00 Continue TPN at goal rate of 70  mL/hr Electrolytes in TPN: No change Na 150 mEq/L (max) K 50 mEq/L Ca 5 mEq/L Mg 5 mEq/L Phos 15 mmol/L Cl:Ac 1:1 No CBG checks/SSI  Resume MVI PO Monitor TPN labs on Mon/Thurs, and as needed   Luise Saint, PharmD, BCPS Secure Chat if ?s 05/25/2024 10:42 AM

## 2024-05-26 ENCOUNTER — Inpatient Hospital Stay (HOSPITAL_COMMUNITY)

## 2024-05-26 DIAGNOSIS — K5790 Diverticulosis of intestine, part unspecified, without perforation or abscess without bleeding: Secondary | ICD-10-CM | POA: Diagnosis not present

## 2024-05-26 LAB — CBC
HCT: 28.4 % — ABNORMAL LOW (ref 36.0–46.0)
Hemoglobin: 8.7 g/dL — ABNORMAL LOW (ref 12.0–15.0)
MCH: 28.2 pg (ref 26.0–34.0)
MCHC: 30.6 g/dL (ref 30.0–36.0)
MCV: 92.2 fL (ref 80.0–100.0)
Platelets: 628 10*3/uL — ABNORMAL HIGH (ref 150–400)
RBC: 3.08 MIL/uL — ABNORMAL LOW (ref 3.87–5.11)
RDW: 16 % — ABNORMAL HIGH (ref 11.5–15.5)
WBC: 8.3 10*3/uL (ref 4.0–10.5)
nRBC: 0 % (ref 0.0–0.2)

## 2024-05-26 LAB — COMPREHENSIVE METABOLIC PANEL WITH GFR
ALT: 37 U/L (ref 0–44)
AST: 31 U/L (ref 15–41)
Albumin: <1.5 g/dL — ABNORMAL LOW (ref 3.5–5.0)
Alkaline Phosphatase: 212 U/L — ABNORMAL HIGH (ref 38–126)
Anion gap: 7 (ref 5–15)
BUN: 20 mg/dL (ref 8–23)
CO2: 24 mmol/L (ref 22–32)
Calcium: 7.9 mg/dL — ABNORMAL LOW (ref 8.9–10.3)
Chloride: 104 mmol/L (ref 98–111)
Creatinine, Ser: 0.62 mg/dL (ref 0.44–1.00)
GFR, Estimated: >60 mL/min (ref >60)
Glucose, Bld: 129 mg/dL — ABNORMAL HIGH (ref 70–99)
Potassium: 3.8 mmol/L (ref 3.5–5.1)
Sodium: 135 mmol/L (ref 135–145)
Total Bilirubin: 0.7 mg/dL (ref 0.0–1.2)
Total Protein: 6 g/dL — ABNORMAL LOW (ref 6.5–8.1)

## 2024-05-26 LAB — PHOSPHORUS: Phosphorus: 3.4 mg/dL (ref 2.5–4.6)

## 2024-05-26 LAB — TRIGLYCERIDES: Triglycerides: 82 mg/dL (ref <150)

## 2024-05-26 LAB — MAGNESIUM: Magnesium: 1.9 mg/dL (ref 1.7–2.4)

## 2024-05-26 LAB — PREALBUMIN: Prealbumin: 16 mg/dL — ABNORMAL LOW (ref 18–38)

## 2024-05-26 MED ORDER — IOHEXOL 9 MG/ML PO SOLN
ORAL | Status: AC
Start: 2024-05-26 — End: 2024-05-26
  Filled 2024-05-26: qty 1000

## 2024-05-26 MED ORDER — TRAVASOL 10 % IV SOLN
INTRAVENOUS | Status: AC
  Filled 2024-05-26: qty 940.8

## 2024-05-26 MED ORDER — IOHEXOL 9 MG/ML PO SOLN
500.0000 mL | ORAL | Status: AC
  Administered 2024-05-26 (×2): 500 mL via ORAL

## 2024-05-26 MED ORDER — IOHEXOL 300 MG/ML  SOLN
80.0000 mL | Freq: Once | INTRAMUSCULAR | Status: AC | PRN
  Administered 2024-05-26: 80 mL via INTRAVENOUS

## 2024-05-26 MED ORDER — SODIUM CHLORIDE (PF) 0.9 % IJ SOLN
INTRAMUSCULAR | Status: AC
  Filled 2024-05-26: qty 50

## 2024-05-26 MED ORDER — PSYLLIUM 95 % PO PACK
1.0000 | PACK | Freq: Two times a day (BID) | ORAL | Status: DC
  Administered 2024-05-26 – 2024-05-29 (×6): 1 via ORAL
  Filled 2024-05-26 (×6): qty 1

## 2024-05-26 MED ORDER — CHLORHEXIDINE GLUCONATE CLOTH 2 % EX PADS
6.0000 | MEDICATED_PAD | Freq: Every day | CUTANEOUS | Status: AC
  Administered 2024-05-27 – 2024-05-30 (×4): 6 via TOPICAL

## 2024-05-26 MED ORDER — LOPERAMIDE HCL 2 MG PO CAPS
4.0000 mg | ORAL_CAPSULE | Freq: Two times a day (BID) | ORAL | Status: DC
  Administered 2024-05-26 – 2024-05-27 (×4): 4 mg via ORAL
  Filled 2024-05-26 (×3): qty 2

## 2024-05-26 MED ORDER — DIPHENOXYLATE-ATROPINE 2.5-0.025 MG PO TABS: 1.0000 | ORAL_TABLET | Freq: Four times a day (QID) | ORAL | Status: DC | PRN

## 2024-05-26 MED ORDER — MUPIROCIN 2 % EX OINT
1.0000 | TOPICAL_OINTMENT | Freq: Two times a day (BID) | CUTANEOUS | Status: AC
  Administered 2024-05-26 – 2024-05-30 (×10): 1 via NASAL
  Filled 2024-05-26: qty 22

## 2024-05-26 MED ORDER — VANCOMYCIN HCL IN DEXTROSE 1-5 GM/200ML-% IV SOLN
1000.0000 mg | INTRAVENOUS | Status: DC
  Administered 2024-05-27 – 2024-05-28 (×2): 1000 mg via INTRAVENOUS
  Filled 2024-05-26 (×3): qty 200

## 2024-05-26 NOTE — Progress Notes (Signed)
 Placed call to surgical answering service to update them on pt's JP drain coming out of buttock and the amount of diarrhea the pt has been having. Dr. Cherlynn Cornfield returned call. Updated her with the above information. Verbal orders received.

## 2024-05-26 NOTE — Progress Notes (Signed)
 Nutrition Follow-up  DOCUMENTATION CODES:   Severe malnutrition in context of chronic illness  INTERVENTION:   -48 hour Calorie count completed. This is #3. Pt unlikely to meet needs if continues to refuse supplements. Consider palliative care consult.   -Boost Breeze po TID, each supplement provides 250 kcal and 9 grams of protein -currently not accepting  -TPN per Pharmacy -Daily weights while on TPN  NUTRITION DIAGNOSIS:   Severe Malnutrition related to chronic illness as evidenced by severe fat depletion, severe muscle depletion.  Ongoing.  GOAL:   Patient will meet greater than or equal to 90% of their needs  Meeting with TPN  MONITOR:   Diet advancement, Labs, Weight trends  ASSESSMENT:   83 year old female with PMH HTN, mild cognitive impairment, asd large bowel obstruction (October 2024) s/p Hartman's resection and colostomy creation who was admitted 5/29 for colostomy reversal.  5/29 Admit; s/p colostomy reversal, small bowel resection, and extensive lysis of adhesions; CLD 5/30 Soft diet -> FLD 6/2 NPO; NGT placed; TPN to be initiated 6/3 CLD 6/4-6/5 Dysphagia 1 -> Soft-> Reg diet, Calorie Count #1 completed, consumed 11-14% of needs 6/6 Soft diet 6/7-6/8 Calorie Count #2 completed, consumed 10-22% of needs 6/9 Heart Healthy -> NPO 6/10 s/p drain placement 6/11 CLD 6/12 FLD -> Regular diet -> soft diet 6/13 Regular diet 6/16 Heart Healthy diet  Calorie Count results  6/13 B: 0% L: 300 kcals, 1g protein D: 90 kcals Supplements: refused Total:  390 kcals (24% of needs), 1g protein (1% of needs)  6/14 B: 0% L: 50 kcals, 2g protein D: 0% Supplements: refused Total: 50 kcals (3% of needs), 2g protein (2%of needs)  6/15 B: 0% L: 0% D: 70 kcals, 3g protein Supplements: refused Total: 70 kcals (4% of needs), 3g protein (3% of needs)  6/16: so far today B: 75% -220 kcals, 13g protein  TPN continues at 70 ml/hr, provides 1680 kcals and 94g  protein  Admission weight: 127 lbs Current weight: 139 lbs   Medications: Vitamin B-12, Vitamin C , Imodium , Multivitamin with minerals daily, Psyllium  Labs reviewed: TG 82  Diet Order:   Diet Order             Diet Heart Room service appropriate? Yes; Fluid consistency: Thin  Diet effective now           Diet - low sodium heart healthy                   EDUCATION NEEDS:   Education needs have been addressed  Skin:  Skin Assessment: Skin Integrity Issues: Skin Integrity Issues:: Stage II, Incisions Stage II: Right Buttocks Incisions: Abdomen  Last BM:  6/16 -type 7  Height:   Ht Readings from Last 1 Encounters:  05/09/24 5' 5 (1.651 m)    Weight:   Wt Readings from Last 1 Encounters:  05/26/24 63.4 kg    BMI:  Body mass index is 23.26 kg/m.  Estimated Nutritional Needs:   Kcal:  1600-1750 kcals  Protein:  80-95 grams  Fluid:  >/= 1.6L   Arna Better, MS, RD, LDN Inpatient Clinical Dietitian Contact via Secure chat

## 2024-05-26 NOTE — Progress Notes (Signed)
 Occupational Therapy Treatment Patient Details Name: Elizabeth Odom MRN: [id number] DOB: [birth year] Today's Date: 05/26/2024   History of present illness 83 y.o. female s/p rectosigmoid resection takedown on 5/29. Course complicated by A-fib with RVR and then hypotension/rectal bleeding and syncope on 05/11/2024.  Patient also had postop ileus needing NG tube and TPN.  Imaging on 05/19/2024 showed possible abscesses.  She was started on IV antibiotics by general surgery on 05/20/2024.  She underwent IR guided drains placed on 05/20/2024.  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   OT comments  Patient was self limiting today with fixation on pending CT. Patient was not receptive to education on positioning to offload pressure on bottom to reduce pain/discomfort. Patient did need mod A to reposition in recliner with increased time and cues during session. Patient declined to engage in any grooming/hygiene tasks during session. [name]. Patient will benefit from continued inpatient follow up therapy, <3 hours/day.       If plan is discharge home, recommend the following:  A lot of help with bathing/dressing/bathroom;Assistance with cooking/housework;Direct supervision/assist for medications management;Assist for transportation;Direct supervision/assist for financial management;Help with stairs or ramp for entrance;Two people to help with walking and/or transfers   Equipment Recommendations  None recommended by OT       Precautions / Restrictions Precautions Precautions: Fall Recall of Precautions/Restrictions: Impaired Precaution/Restrictions Comments: ABD surgery,  abdominal JP Restrictions Weight Bearing Restrictions  Per Provider Order: No              ADL either performed or assessed with clinical judgement   ADL Overall ADL's : Needs assistance/impaired       Grooming Details (indicate cue type and reason): patient declining to engage in any ADLs at this time even with encouragement reporting  i am giving up. patient was educated on how this was not a going home attitude. nurse made aware of patients reports during session.       General ADL Comments: patient was almost supine in recliner with neck flexed onto backrest upon entrance to room. patietn was mod A to scoot backwards into chair with cues for positioning. patient declining to stand or go to different spot in room at this time. patient reporting feeling soiled. none observed on pad with scooting and patient declining to stand up to get hygiene completed anyway. patients nurse made aware of patient declining to get clean even when reporting being soiled while no smell or soiled was observed by OT. patients nurse verbalized understanding.      Cognition Arousal: Alert Behavior During Therapy: Flat affect Cognition: Cognition impaired     Awareness: Intellectual awareness impaired, Online awareness impaired       OT - Cognition Comments: patient having increased difficulty with education on repositioning to alievate pain.                 Following commands: Impaired Following commands impaired: Follows one step commands with increased time                    Pertinent Vitals/ Pain       Pain Assessment Pain Assessment: Faces Faces Pain Scale: Hurts even more Pain Location: bottom and abdomen with repositioning Pain Descriptors / Indicators: Grimacing, Constant, Discomfort Pain  Intervention(s): Limited activity within patient's tolerance, Monitored during session, Repositioned         Frequency  Min 2X/week        Progress Toward Goals  OT Goals(current goals can now be found in the care plan  section)  Progress towards OT goals: Not progressing toward goals - comment (patient having fixation on pending CT and prep at this time)     Plan         AM-PAC OT 6 Clicks Daily Activity     Outcome Measure   Help from another person eating meals?: Total (NPO pending CT) Help from another person taking care of personal grooming?: A Little Help from another person toileting, which includes using toliet, bedpan, or urinal?: Total Help from another person bathing (including washing, rinsing, drying)?: Total Help from another person to put on and taking off regular upper body clothing?: A Lot Help from another person to put on and taking off regular lower body clothing?: Total 6 Click Score: 9    End of Session    OT Visit Diagnosis: Unsteadiness on feet (R26.81);Pain;Muscle weakness (generalized) (M62.81)   Activity Tolerance Patient limited by pain   Patient Left in chair;with call bell/phone within reach   Nurse Communication Other (comment) (patients reports that she had given up and declining to get cleaned while reporting she was soiled even when no soiled was observed.)        Time: 1318-1330 OT Time Calculation (min): 12 min  Charges: OT General Charges $OT Visit: 1 Visit OT Treatments $Therapeutic Activity: 8-22 mins  Elizabeth Heckler, MS Acute Rehabilitation Department Office# [phone]   Elizabeth Odom 05/26/2024, 3:08 PM

## 2024-05-26 NOTE — Progress Notes (Signed)
 PT Cancellation Note  Patient Details Name: ZAHRIYAH JOO MRN: [id number] DOB: 05-08-1941   Cancelled Treatment:     Pt was OOB in recliner had recently returned from CT.  Max c/o fatigue. Jonell Neptune' think I can, stated Pt.  Rehab Team to continue to attempt and see another day as schedule permits.  Bess Broody  PTA Acute  Rehabilitation Services Office M-F          [phone]

## 2024-05-26 NOTE — Progress Notes (Signed)
 PHARMACY - TOTAL PARENTERAL NUTRITION CONSULT NOTE   Indication: Prolonged ileus  Patient Measurements: Height: 5' 5 (165.1 cm) Weight: 63.4 kg (139 lb 12.4 oz) IBW/kg (Calculated) : 57 TPN AdjBW (KG): 61.3 Body mass index is 23.26 kg/m.  Assessment: 33 yoF with history of diverticulitis with perforation and abscess. 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 She requested ostomy takedown and underwent LOA, colostomy takedown, small bowel resection with anastomosis on 5/29. Pharmacy is consulted to dose TPN starting 6/2 for postop ileus.  Now with anastomotic leak complicated by multiple-intraabdominal abscesses.  Glucose / Insulin : no Hx DM, A1c 5.1%. BG goal 100-180. - CBG check and SSI discontinued on 6/11. BG 129  Electrolytes: All WNL, including CorrCa (9.9) Hepatic: LFTs, Tbili, TG WNL.  -Albumin  low. Alk Phos trending down I/O: No mIVF. PO intake, 450 mL -UOP: x8, stool x13, drain: 20 mL GI Imaging:  - 6/9 CT: [name] dehiscence along the rectal anastomosis, free air and multiple fluid collections GI Surgeries / Procedures: - 5/29: LAR, SBR, colostomy takedown, LOA - 6/10 drains placed x 2 in IR (midline abd, transgluteal)  Central access: Double lumen PICC placed 6/2 TPN start date: 6/2  Nutritional Goals: - Goal TPN rate is 70 mL/hr (provides 94 g of protein and 1680 kcals per day)  RD Assessment:  Estimated Needs Total Energy Estimated Needs: 1600-1750 kcals Total Protein Estimated Needs: 80-95 grams Total Fluid Estimated Needs: >/= 1.6L  Current Nutrition:  -TPN at goal -Diet advanced to heart healthy - Calorie count started 6/13 -Pt continues to refuse all Boost supplementation, Magic cup  Plan:  At 18:00 Continue TPN at goal rate of 70 mL/hr Electrolytes in TPN: No change Na 150 mEq/L (max) K 50 mEq/L Ca 5 mEq/L Mg 5 mEq/L Phos 15  mmol/L Cl:Ac 1:1 No CBG checks/SSI  MVI PO Monitor TPN labs on Mon/Thurs.  Shireen Dory, PharmD 05/26/24 9:09 AM

## 2024-05-26 NOTE — Progress Notes (Addendum)
 Grand daughter, Genevive Ket called up to unit several times. Nurse recently has had a few minutes to return call to her at [phone]. She inquired about what the pt shared with her about one of her drains coming out. Shared with Nellie Banas that the one in her buttock did come out and the surgeon was made aware of it. Also informed her of the amount of diarrhea she has been having and that this was informed to the surgeon on call, as well, orders were received- had given the pt Lomotil and Metamucil. [place] nurse for calling her back. During the call, Nellie Banas shared that the pt had red sauce on pizza and this makes her have diarrhea.

## 2024-05-26 NOTE — Progress Notes (Signed)
 PROGRESS NOTE    Elizabeth Odom  ZOX:[id number] DOB: 1941-05-01 DOA: 05/08/2024 PCP: Anthon Kins, MD   Brief Narrative:  83 year old female with past medical history significant for remote history of A-fib evaluated with a loop recorder which was negative, prior DVT and small PE currently not on any anticoagulation because of some history of bleeding in the past had IVC filter which was subsequently removed at some point), anemia, PACs, HTN, mild cognitive impairment,  large bowel obstruction requiring colon resection and colostomy was admitted for colostomy takedown by general surgery.  Patient underwent elective robotic rectosigmoid resection, colostomy takedown with bowel anastomosis, extensive lysis of additions on 05/08/24 after preop cardiac clearance. On 05/09/24 evening, patient developed AMS after polypharmacy along with A-fib with RVR and TRH was consulted. Patient was transferred to stepdown unit. Course complicated by A-fib with RVR: Cardiology was consulted.  Subsequently, rate got controlled.  Cardiology signed off on 05/19/2024.  Eliquis  on hold.  Hospitalization also complicated by hypotension/rectal bleeding and syncope on 05/11/2024.  Patient also had postop ileus needing NG tube and TPN.  Imaging on 05/19/2024 showed possible abscesses.  She was started on IV antibiotics by general surgery on 05/20/2024.  She underwent IR guided drains placed on 05/20/2024.  Assessment & Plan:   Persistent A-fib/flutter - Currently rate controlled.  Continue metoprolol  succinate.  Cardiology signed off on 05/19/2024.  Recommended to restart Eliquis  when okay by surgery.  Outpatient follow-up with cardiology  Status post elective robotic rectosigmoid resection, colostomy takedown with bowel anastomosis, extensive lysis of additions on 05/08/24 with hospital course complicated by ileus and now possible intra-abdominal abscesses - Management as per primary surgical team.  Currently still getting  TPN. -Imaging on 05/19/2024 showed possible abscesses.  She underwent IR guided drains placed on 05/20/2024.  Currently on IV antibiotics and antifungal  Acute blood loss anemia - Status post 4 units packed red cell transfusion.  Hemoglobin 8.7 today.  Monitor intermittently.  Acute kidney injury - Resolved  Hypoalbuminemia - Continue TPN  Leukocytosis - Resolved  Thrombocytosis - Possibly reactive.  Monitor intermittently  Hyponatremia -Improved  Syncope - Had syncope on 05/11/2024 1 patient was on bedside commode.  Possibly due to hypotension or vasovagal.  Echo with preserved LVEF.  No further syncope since then  Urinary retention--failed voiding trial.  Continue Flomax .  Continues to have Foley catheter.  Will need outpatient follow-up with urology  Acute metabolic encephalopathy/delirium - Resolved.  Monitor mental status.  Fall precautions.  Minimize opiates and sedatives  Elevated LFTs - Resolved  Subjective: Patient seen and examined at bedside.  Still feels weak.  No vomiting, fever, worsening abdominal pain reported.  Objective: Vitals:   05/25/24 1317 05/25/24 2222 05/26/24 0628 05/26/24 0718  BP: (!) 107/56 125/73 111/66   Pulse: 79 96 92   Resp: 18 18 16    Temp: 98.7 F (37.1 C) 98.4 F (36.9 C) 98.4 F (36.9 C)   TempSrc: Oral Oral    SpO2: 97% 98% 98%   Weight:    63.4 kg  Height:        Intake/Output Summary (Last 24 hours) at 05/26/2024 0755 Last data filed at 05/26/2024 0731 Gross per 24 hour  Intake 1125.6 ml  Output 20 ml  Net 1105.6 ml   Filed Weights   05/24/24 0500 05/25/24 0500 05/26/24 0718  Weight: 61.5 kg 62.3 kg 63.4 kg    Examination:  General: No acute distress.  Remains on room air.  Chronically ill and  deconditioned looking. ENT/neck: No obvious JVD elevation or palpable neck masses noted respiratory: Bilateral decreased breath sounds at bases with scattered crackles CVS: Currently rate controlled; S1 and S2 are  heard abdominal: Soft, slightly tender and distended; no organomegaly, bowel sounds are heard.  Abdominal drains are present Extremities: No clubbing; mild lower extremity edema present  CNS: Poor historian.  Extremity slow to respond.  No focal neurologic deficit.  Able to move extremities Lymph: No obvious palpable lymphadenopathy Skin: No obvious petechia/rashes psych: Affect is flat with no agitation currently Musculoskeletal: No obvious joint tenderness/erythema     Data Reviewed: I have personally reviewed following labs and imaging studies  CBC: Recent Labs  Lab 05/21/24 0331 05/22/24 0407 05/24/24 0427 05/25/24 0300 05/26/24 0224  WBC 9.2 7.9 9.4 8.1 8.3  HGB 9.7* 9.4* 9.3* 8.6* 8.7*  HCT 31.4* 30.6* 30.3* 28.8* 28.4*  MCV 92.1 94.2 94.1 94.7 92.2  PLT 600* 635* 635* 630* 628*   Basic Metabolic Panel: Recent Labs  Lab 05/20/24 0350 05/21/24 0331 05/22/24 0407 05/23/24 0500 05/24/24 0427 05/25/24 0300 05/26/24 0224  NA 134* 136 136  --  132*  --  135  K 4.1 4.2 4.1  --  4.0 3.9 3.8  CL 101 104 104  --  101  --  104  CO2 24 25 24   --  25  --  24  GLUCOSE 129* 131* 121*  --  118*  --  129*  BUN 23 15 29*  --  30*  --  20  CREATININE 0.65 0.46 0.59 0.69 0.64 0.62 0.62  CALCIUM  7.7* 7.9* 7.8*  --  7.8*  --  7.9*  MG  --   --  2.0  --  1.9  --  1.9  PHOS  --   --  3.8  --  3.2  --  3.4   GFR: Estimated Creatinine Clearance: 47.9 mL/min (by C-G formula based on SCr of 0.62 mg/dL). Liver Function Tests: Recent Labs  Lab 05/20/24 0350 05/22/24 0407 05/24/24 0427 05/26/24 0224  AST 32 42* 26 31  ALT 47* 50* 39 37  ALKPHOS 269* 265* 226* 212*  BILITOT 1.2 0.8 1.0 0.7  PROT 5.8* 6.0* 6.2* 6.0*  ALBUMIN  <1.5* <1.5* <1.5* <1.5*   No results for input(s): LIPASE, AMYLASE in the last 168 hours. No results for input(s): AMMONIA in the last 168 hours. Coagulation Profile: Recent Labs  Lab 05/19/24 2054  INR 1.2   Cardiac Enzymes: No results for  input(s): CKTOTAL, CKMB, CKMBINDEX, TROPONINI in the last 168 hours. BNP (last 3 results) No results for input(s): PROBNP in the last 8760 hours. HbA1C: No results for input(s): HGBA1C in the last 72 hours. CBG: Recent Labs  Lab 05/20/24 0749 05/20/24 1816 05/21/24 0209 05/21/24 0754 05/21/24 1615  GLUCAP 131* 130* 144* 164* 134*   Lipid Profile: Recent Labs    05/26/24 0224  TRIG 82    Thyroid  Function Tests: No results for input(s): TSH, T4TOTAL, FREET4, T3FREE, THYROIDAB in the last 72 hours. Anemia Panel: No results for input(s): VITAMINB12, FOLATE, FERRITIN, TIBC, IRON, RETICCTPCT in the last 72 hours. Sepsis Labs: No results for input(s): PROCALCITON, LATICACIDVEN in the last 168 hours.  Recent Results (from the past 240 hours)  Aerobic/Anaerobic Culture w Gram Stain (surgical/deep wound)     Status: None (Preliminary result)   Collection Time: 05/22/24  3:59 PM   Specimen: Abdomen  Result Value Ref Range Status   Specimen Description   Final  ABDOMEN Performed at [facility], [address] [place], [place] [address]    Special Requests   Final    ABDOMINAL DRAIN Performed at [facility], [address], [place], [place] [address]    Gram Stain   Final    ABUNDANT WBC PRESENT, PREDOMINANTLY PMN RARE GRAM POSITIVE COCCI RARE YEAST WITH PSEUDOHYPHAE Performed at [facility], [address], [place], [place] [address]    Culture   Final    MODERATE PSEUDOMONAS AERUGINOSA SUSCEPTIBILITIES TO FOLLOW MODERATE CANDIDA ALBICANS NO ANAEROBES ISOLATED; CULTURE IN PROGRESS FOR 5 DAYS    Report Status PENDING  Incomplete  Aerobic/Anaerobic Culture w Gram Stain (surgical/deep wound)     Status: None (Preliminary result)   Collection Time: 05/22/24  3:59 PM   Specimen: Back  Result Value Ref Range Status   Specimen Description   Final    BACK Performed at [facility], [address] [place], [place] [address]    Special Requests   Final    TRANSGLUTEAL PERCUTANEOUS IR DRAIN Performed at [facility], [address] [place], [place] [address]    Gram Stain   Final    NO WBC SEEN ABUNDANT GRAM NEGATIVE RODS RARE GRAM POSITIVE COCCI IN PAIRS RARE BUDDING YEAST SEEN    Culture   Final    ABUNDANT PSEUDOMONAS AERUGINOSA ABUNDANT STAPHYLOCOCCUS AUREUS ABUNDANT ENTEROCOCCUS FAECALIS ABUNDANT CANDIDA ALBICANS SUSCEPTIBILITIES TO FOLLOW Performed at [facility], [address], [place], [place] [address]    Report Status PENDING  Incomplete         Radiology Studies: No results found.       Scheduled Meds:  acetaminophen   500 mg Oral TID WC & HS   ascorbic acid   250 mg Oral Daily   Chlorhexidine  Gluconate Cloth  6 each Topical Daily   cyanocobalamin   1,000 mcg Oral Daily   feeding supplement  1 Container Oral TID BM   liver oil-zinc  oxide   Topical BID   loperamide   4 mg Oral BID   metoprolol  succinate  12.5 mg Oral Daily   multivitamin with minerals  1 tablet Oral Daily   pantoprazole   40 mg Oral Daily   polycarbophil  625 mg Oral Daily   sodium chloride  flush  10-40 mL Intracatheter Q12H   sodium chloride  flush  3 mL Intravenous Q12H   sodium chloride  flush  5 mL Intracatheter Q8H   tamsulosin   0.4 mg Oral QPC supper   Continuous Infusions:  sodium chloride      micafungin (MYCAMINE) 100 mg in sodium chloride  0.9 % 100 mL IVPB 100 mg (05/25/24 1142)   piperacillin -tazobactam (ZOSYN )  IV 3.375 g (05/26/24 0611)   TPN ADULT (ION) 70 mL/hr at 05/25/24 1742   vancomycin  1,000 mg (05/26/24 0730)          Audria Leather, MD [facility] 05/26/2024, 7:55 AM

## 2024-05-26 NOTE — Plan of Care (Signed)
  Problem: Bowel/Gastric: Goal: Gastrointestinal status for postoperative course will improve Outcome: Progressing   Problem: Health Behavior/Discharge Planning: Goal: Identification of community resources to assist with postoperative recovery needs will improve Outcome: Progressing   Problem: Nutritional: Goal: Will attain and maintain optimal nutritional status will improve Outcome: Progressing

## 2024-05-26 NOTE — Progress Notes (Addendum)
 05/26/2024  Elizabeth Odom [id number] Jul 13, 1941  CARE TEAM: PCP: Anthon Kins, MD  Outpatient Care Team: Patient Care Team: Anthon Kins, MD as PCP - General (Internal Medicine) Cody Das, MD as PCP - Cardiology (Cardiology) Lajuan Pila, MD as Consulting Physician (Gastroenterology) Shela Derby, MD as Consulting Physician (General Surgery) Glory Larsen, MD (Neurology) Lahoma Pigg, MD as Consulting Physician (Urology) Sandee Crook Margart Shears, MD as Consulting Physician (Cardiology) Candyce Champagne, MD as Consulting Physician (Colon and Rectal Surgery)  Inpatient Treatment Team: Treatment Team:  Candyce Champagne, MD Ruel Cotta, Heinz Llano, MD Ozell Blunt, MD Vladimir Groves Radiology, MD Lexine Redder, MD Audria Leather, MD Larayne Platter, NT Ahmad Hotter, NT Gae Jointer, RN Jari Merles, RN Chesley Cost, PTA Jame Maze, OT   Problem List:   Principal Problem:   Diverticular disease Active Problems:   A-fib [facility])   Restless leg   ABLA (acute blood loss anemia)   Need for emotional support   Acute urinary retention   Mixed hyperlipidemia   Essential hypertension   Diverticular disease of left colon   History of DVT (deep vein thrombosis)   History of pulmonary embolus (PE)   Pre-diabetes   Presence of IVC filter   Stricture of sigmoid colon (HCC)   Protein-calorie malnutrition, severe   Hypokalemia   05/08/2024  POST-OPERATIVE DIAGNOSIS:   COLOSTOMY FOR RESECTION, DESIRE FOR OSTOMY TAKEDOWN RECTAL STRICTURE HISTORY OF DIVERTICULITIS WITH PERFORATION & ABSCESS   PROCEDURE:   -ROBOTIC RECTOSIGMOID RESECTION (LAR) -TAKEDOWN OF END COLOSTOMY WITH ANASTOMOSIS -RESECTION OF SMALL INTESTINE WITH ANASTOMOSIS -SMALL BOWEL REPAIR -LYSIS OF ADHESIONS x 115 MINUTES (66% OF CASE),  -INTRAOPERATIVE ASSESSMENT OF TISSUE VASCULAR PERFUSION USING ICG (indocyanine green ) IMMUNOFLUORESCENCE,  -TRANSVERSUS ABDOMINIS PLANE  (TAP) BLOCK - BILATERAL -FLEXIBLE SIGMOIDOSCOPY   SURGEON:  Eddye Goodie, MD  OR FINDINGS:  Very dense adhesions of small bowel especially to pelvis and retroperitoneum.  Extremely concrete dense adhesions with friable tissue near her old prior small bowel anastomosis and distal jejunum.  Required small bowel repair and required small bowel resection including old anastomosis given severe tissues.  Patient had significant fibrotic stricturing of rectal stump at the mid/distal rectal junction.  Could not be released up.  Ended up doing resection of rectal stump to mid/distal rectal junction.  No obvious metastatic disease on visceral parietal peritoneum or liver.   It is a 29mm EEA anastomosis ( distal descending colon  connected to mid/distal rectal junction.)  It rests 7 cm from the anal verge by rigid proctoscopy.  05/20/2024  Post procedural Dx: Post op abscess, multiple   Technically successful CT guided placed of a  10 Fr drainage catheter placement x 2  into the midline ventral and dorsal perirectal abscesses.     Rosetta Cons, MD    Assessment [facility] Stay = 18 days) 18 Days Post-Op    Ileus resolving   Fluid collections raises suspicion of contained leak s/p drainage 6/10    Plan:  Drain fell out in the transgluteal region.  It was closest to the contained anastomotic leak.  It needs to be replaced.  I put an order for interventional radiology to replace it.    Holding her Eliquis .  Last dose 6/15 yesterday.   Hopefully they can do it tomorrow, 6/17.  Attending wants to repeat CAT scan first so will order it.  Hopefully can happen today.  Would like a well contrasted CT scan.  See if  abdominal drain can be removed since it has been low volume and was mainly gas containing.  Antidiarrheal regimen.   She refused FiberCon but is willing to take psyllium.    Will increase loperamide  before doing that and Lomotil.  Regroup.  Growing numerous organisms  including Pseudomonas and Candida albicans.   Simplify regimen to piperacillin /tazobactam and micafungin.    Would be helpful to get sensitivities on albicans to make sure it is not resistant.  Anticipate 14-day course of oral antibiotics for now given her malnourished and elderly state and significant pelvic contamination.  Pain control without over-sedating her.  Tramadol , Flexeril, and gabapentin  since the opioids make her rather sedated/groggy   TPN for severe protein malnutrition.  Prealbumin starting to climb in more normal range which is a guardedly hopeful sign.  Try to wean down to half rate.  If gets drain replaced and doing well hopefully wean off in a day or 2.  Foley catheter out with no problems with urination now that pelvic collection under better control.  Continue tamsulosin  if fails then replace and leave for several weeks until can follow-up with urology as an outpatient.  Otherwise try and get out so she does not have a risk of UTIs   No rectal tubes.  Atrial fibrillation postop.  History of a prior event.    Cardiology signed off for now.  Reconsult as needed.  Stop telemetry for now  Restarted DOAC Eliquis  6/12 after prior hematochezia.  No strong evidence of that.  Follow hemoglobin.  Holding Eliquis  6/15 in anticipation of drain replacement 6/17  Rate controlled switch from amlodipine  to metoprolol .  Less tachycardic.  Blood pressure soft.  Back down 12.5 mg of metoprolol  and follow with PRN (as needed) breakthrough  -monitor electrolytes & replace as needed.  Keep K>4, Mg>2, Phos>3  -VTE prophylaxis- SCDs.  Anticoagulation as appropriate  -mobilize as tolerated to help recovery.  Enlist therapies in moderate/high risk patients as appropriate.  Patient is refusing skilled facility or any rehab but is not getting up much.  Try to have strategies to help her get up and mobilize and actually work with physical therapy, or she is going to lose her argument.    I  updated the patient's status to the patient and nurse.  Recommendations were made.  Questions were answered.  They expressed understanding & appreciation.  -Disposition: TBD      I reviewed last 24 h vitals and pain scores, last 48 h intake and output, last 24 h labs and trends, and last 24 h imaging results.  I have reviewed this patient's available data, including medical history, events of note, test results, etc as part of my evaluation.   A significant portion of that time was spent in counseling. Care during the described time interval was provided by me.  This care required moderate level of medical decision making.  05/26/2024    Subjective: (Chief complaint)  Transgluteal pelvic drain fell out.  Patient prefers to take psyllium over FiberCon.  Patient with diarrhea but apparently had tomato sauce which is a trigger.  Nurse in room.  Urinating with Foley catheter out.  Patient not nauseated.  Objective:  Vital signs:  Vitals:   05/25/24 1003 05/25/24 1317 05/25/24 2222 05/26/24 0628  BP: 111/63 (!) 107/56 125/73 111/66  Pulse: 81 79 96 92  Resp:  18 18 16   Temp:  98.7 F (37.1 C) 98.4 F (36.9 C) 98.4 F (36.9 C)  TempSrc:  Oral Oral  SpO2: 98% 97% 98% 98%  Weight:      Height:        Last BM Date : 05/25/24  Intake/Output   Yesterday:  06/15 0701 - 06/16 0700 In: 885.6 [P.O.:450; I.V.:26.8; IV Piggyback:408.8] Out: 20 [Drains:20] This shift:  No intake/output data recorded.  Bowel function:  Flatus: YES  BM:  YES  Drain:  Right lower quadrant drain: Scant purulent  Transgluteal drain: fell our 6/15  Physical Exam:  General: Awake and alert.  Conversational.  Somewhat perseverating, repeating questions but I think she is use of hearing.       Eyes: PERRL, normal EOM.  Sclera clear.  No icterus Neuro: CN II-XII intact w/o focal sensory/motor deficits.  All extremities.  No facial droop. Lymph: No head/neck/groin lymphadenopathy Psych:   No delerium/psychosis/paranoia.  Oriented x 4 HENT: Normocephalic, Mucus membranes moist.  No thrush.  Mild HOH Neck: Supple, No tracheal deviation.  No obvious thyromegaly.  Prefers to sit with head flexed and chin down.  Does not want to have her neck back.  Pillow adjusted. Chest: No pain to chest wall compression.  Good respiratory excursion.  No audible wheezing CV:  Pulses intact.  Regular rhythm.  No major extremity edema MS: Normal AROM mjr joints.  No obvious deformity  Abdomen:  Soft.  Moderately distended.  Nontender.  No guarding.  No complaints of pain to percussion or benching or cough.  No evidence of peritonitis. Old ostomy incision with normal healing ridge.    GU: No Foley Rectal: deferred today  Ext:   No deformity.  No mjr edema.  No cyanosis Skin: No petechiae / purpurea.  No major sores.  Warm and dry    Results:   Cultures: Recent Results (from the past 720 hours)  Urine Culture     Status: Abnormal   Collection Time: 05/09/24  8:30 PM   Specimen: Urine, Clean Catch  Result Value Ref Range Status   Specimen Description   Final    URINE, CLEAN CATCH Performed at [facility], [address] [place], [place] [address]    Special Requests   Final    NONE Performed at [facility], [address] [place], [place] [address]    Culture (A)  Final    <10,000 COLONIES/mL INSIGNIFICANT GROWTH Performed at [facility], [address], [place], [place] [address]    Report Status 05/11/2024 FINAL  Final  Aerobic/Anaerobic Culture w Gram Stain (surgical/deep wound)     Status: None (Preliminary result)   Collection Time: 05/22/24  3:59 PM   Specimen: Abdomen  Result Value Ref Range Status   Specimen Description   Final    ABDOMEN Performed at [facility], [address] [place], [place] [address]    Special Requests   Final    ABDOMINAL DRAIN Performed at [facility],  [address], [place], [place] [address]    Gram Stain   Final    ABUNDANT WBC PRESENT, PREDOMINANTLY PMN RARE GRAM POSITIVE COCCI RARE YEAST WITH PSEUDOHYPHAE Performed at [facility], [address] [place], [place] [address]    Culture   Final    MODERATE PSEUDOMONAS AERUGINOSA SUSCEPTIBILITIES TO FOLLOW MODERATE CANDIDA ALBICANS NO ANAEROBES ISOLATED; CULTURE IN PROGRESS FOR 5 DAYS    Report Status PENDING  Incomplete  Aerobic/Anaerobic Culture w Gram Stain (surgical/deep wound)     Status: None (Preliminary result)   Collection Time:  05/22/24  3:59 PM   Specimen: Back  Result Value Ref Range Status   Specimen Description   Final    BACK Performed at [facility], [address] [place], [place] [address]    Special Requests   Final    TRANSGLUTEAL PERCUTANEOUS IR DRAIN Performed at [facility], [address], [place], [place] [address]    Gram Stain   Final    NO WBC SEEN ABUNDANT GRAM NEGATIVE RODS RARE GRAM POSITIVE COCCI IN PAIRS RARE BUDDING YEAST SEEN    Culture   Final    ABUNDANT PSEUDOMONAS AERUGINOSA ABUNDANT STAPHYLOCOCCUS AUREUS ABUNDANT ENTEROCOCCUS FAECALIS ABUNDANT CANDIDA ALBICANS SUSCEPTIBILITIES TO FOLLOW Performed at [facility], [address] [place], [place] [address]    Report Status PENDING  Incomplete    Labs: Results for orders placed or performed during the hospital encounter of 05/08/24 (from the past 48 hours)  CBC     Status: Abnormal   Collection Time: 05/25/24  3:00 AM  Result Value Ref Range   WBC 8.1 4.0 - 10.5 K/uL   RBC 3.04 (L) 3.87 - 5.11 MIL/uL   Hemoglobin 8.6 (L) 12.0 - 15.0 g/dL   HCT 08.6 (L) 57.8 - 46.9 %   MCV 94.7 80.0 - 100.0 fL   MCH 28.3 26.0 - 34.0 pg   MCHC 29.9 (L) 30.0 - 36.0 g/dL   RDW 62.9 (H) 52.8 - 41.3 %   Platelets 630 (H) 150 - 400 K/uL   nRBC 0.0 0.0 - 0.2 %    Comment: Performed at [facility], [address] [place], [place] [address]  Creatinine, serum     Status: None   Collection Time: 05/25/24  3:00 AM  Result Value Ref Range   Creatinine, Ser 0.62 0.44 - 1.00 mg/dL   GFR, Estimated >02 >72 mL/min    Comment: (NOTE) Calculated using the CKD-EPI Creatinine Equation (2021) Performed at [facility], [address], [place], [place] [address]   Potassium     Status: None   Collection Time: 05/25/24  3:00 AM  Result Value Ref Range   Potassium 3.9 3.5 - 5.1 mmol/L    Comment: Performed at [facility], [address] [place], [place] [address]  Prealbumin     Status: Abnormal   Collection Time: 05/26/24  2:24 AM  Result Value Ref Range   Prealbumin 16 (L) 18 - 38 mg/dL    Comment: Performed at [facility], [address], [place], [place] [address]  Comprehensive metabolic panel     Status: Abnormal   Collection Time: 05/26/24  2:24 AM  Result Value Ref Range   Sodium 135 135 - 145 mmol/L   Potassium 3.8 3.5 - 5.1 mmol/L   Chloride 104 98 - 111 mmol/L   CO2 24 22 - 32 mmol/L   Glucose, Bld 129 (H) 70 - 99 mg/dL    Comment: Glucose reference range applies only to samples taken after fasting for at least 8 hours.   BUN 20 8 - 23 mg/dL   Creatinine, Ser 6.38 0.44 - 1.00 mg/dL   Calcium  7.9 (L) 8.9 - 10.3 mg/dL   Total Protein 6.0 (L) 6.5 - 8.1 g/dL   Albumin  <1.5 (L) 3.5 - 5.0 g/dL   AST 31 15 - 41 U/L   ALT 37 0 - 44 U/L   Alkaline Phosphatase 212 (H) 38 - 126 U/L  Total Bilirubin 0.7 0.0 - 1.2 mg/dL   GFR, Estimated >09 >81 mL/min    Comment: (NOTE) Calculated using the CKD-EPI Creatinine Equation (2021)    Anion gap 7 5 - 15    Comment: Performed at [facility], [address], [place], [place] [address]  Magnesium      Status: None   Collection Time: 05/26/24  2:24 AM  Result Value Ref Range   Magnesium  1.9 1.7 - 2.4 mg/dL    Comment: Performed at [facility], [address],  [place], [place] [address]  Phosphorus     Status: None   Collection Time: 05/26/24  2:24 AM  Result Value Ref Range   Phosphorus 3.4 2.5 - 4.6 mg/dL    Comment: Performed at [facility], [address], [place], [place] [address]  Triglycerides     Status: None   Collection Time: 05/26/24  2:24 AM  Result Value Ref Range   Triglycerides 82 <150 mg/dL    Comment: Performed at [facility], [address] [place], [place] [address]  CBC     Status: Abnormal   Collection Time: 05/26/24  2:24 AM  Result Value Ref Range   WBC 8.3 4.0 - 10.5 K/uL   RBC 3.08 (L) 3.87 - 5.11 MIL/uL   Hemoglobin 8.7 (L) 12.0 - 15.0 g/dL   HCT 69.6 (L) 29.5 - 28.4 %   MCV 92.2 80.0 - 100.0 fL   MCH 28.2 26.0 - 34.0 pg   MCHC 30.6 30.0 - 36.0 g/dL   RDW 13.2 (H) 44.0 - 10.2 %   Platelets 628 (H) 150 - 400 K/uL   nRBC 0.0 0.0 - 0.2 %    Comment: Performed at [facility], [address], [place], [place] [address]    Imaging / Studies: No results found.    Medications / Allergies: per chart  Antibiotics: Anti-infectives (From admission, onward)    Start     Dose/Rate Route Frequency Ordered Stop   05/23/24 1400  piperacillin -tazobactam (ZOSYN ) IVPB 3.375 g        3.375 g 12.5 mL/hr over 240 Minutes Intravenous Every 8 hours 05/23/24 0804     05/23/24 1200  micafungin (MYCAMINE) 100 mg in sodium chloride  0.9 % 100 mL IVPB        100 mg 105 mL/hr over 1 Hours Intravenous Every 24 hours 05/23/24 0755 06/06/24 1159   05/22/24 0800  vancomycin  (VANCOCIN ) IVPB 1000 mg/200 mL premix        1,000 mg 200 mL/hr over 60 Minutes Intravenous Every 24 hours 05/21/24 0634     05/21/24 0730  vancomycin  (VANCOREADY) IVPB 1250 mg/250 mL        1,250 mg 166.7 mL/hr over 90 Minutes Intravenous  Once 05/21/24 0634 05/21/24 1043   05/19/24 1400  piperacillin -tazobactam (ZOSYN ) IVPB 3.375 g  Status:  Discontinued        3.375 g 12.5 mL/hr over 240 Minutes Intravenous Every 8  hours 05/19/24 1303 05/23/24 0804   05/09/24 0900  erythromycin  250 mg in sodium chloride  0.9 % 100 mL IVPB        250 mg 100 mL/hr over 60 Minutes Intravenous Every 8 hours 05/09/24 0732 05/11/24 0044   05/08/24 2200  cefoTEtan  (CEFOTAN ) 2 g in sodium chloride  0.9 % 100 mL IVPB        2 g 200 mL/hr over 30 Minutes Intravenous Every 12 hours 05/08/24 1759 05/09/24 0749  05/08/24 1400  neomycin  (MYCIFRADIN ) tablet 1,000 mg  Status:  Discontinued       Placed in And Linked Group   1,000 mg Oral 3 times per day 05/08/24 1119 05/08/24 1120   05/08/24 1400  metroNIDAZOLE  (FLAGYL ) tablet 1,000 mg  Status:  Discontinued       Placed in And Linked Group   1,000 mg Oral 3 times per day 05/08/24 1119 05/08/24 1120   05/08/24 1130  cefoTEtan  (CEFOTAN ) 2 g in sodium chloride  0.9 % 100 mL IVPB        2 g 200 mL/hr over 30 Minutes Intravenous On call to O.R. 05/08/24 1119 05/09/24 0749         Note: Portions of this report may have been transcribed using voice recognition software. Every effort was made to ensure accuracy; however, inadvertent computerized transcription errors may be present.   Any transcriptional errors that result from this process are unintentional.    Eddye Goodie, MD, FACS, MASCRS Esophageal, Gastrointestinal & Colorectal Surgery Robotic and Minimally Invasive Surgery  [facility] [address] [name], [place] [address] [phone] Fax [phone] Main  CONTACT INFORMATION: Weekday (9AM-5PM): Call [facility] main office at [phone] Weeknight (5PM-9AM) or Weekend/Holiday: Check EPIC Web Links tab & use AMION (password  [email or web address]) for General Surgery [facility] coverage  Please, DO NOT use SecureChat  (it is not reliable communication to reach operating surgeons & will lead to a delay in care).   Epic staff messaging available for outptient concerns needing 1-2 business day response.       05/26/2024  7:27 AM

## 2024-05-27 ENCOUNTER — Inpatient Hospital Stay (HOSPITAL_COMMUNITY)

## 2024-05-27 DIAGNOSIS — K5790 Diverticulosis of intestine, part unspecified, without perforation or abscess without bleeding: Secondary | ICD-10-CM | POA: Diagnosis not present

## 2024-05-27 MED ORDER — MIDAZOLAM HCL 2 MG/2ML IJ SOLN
INTRAMUSCULAR | Status: AC
  Filled 2024-05-27: qty 2

## 2024-05-27 MED ORDER — FENTANYL CITRATE (PF) 100 MCG/2ML IJ SOLN
INTRAMUSCULAR | Status: AC
  Filled 2024-05-27: qty 2

## 2024-05-27 MED ORDER — FENTANYL CITRATE (PF) 100 MCG/2ML IJ SOLN
INTRAMUSCULAR | Status: AC | PRN
  Administered 2024-05-27 (×2): 50 ug via INTRAVENOUS

## 2024-05-27 MED ORDER — MIDAZOLAM HCL 2 MG/2ML IJ SOLN
INTRAMUSCULAR | Status: AC | PRN
  Administered 2024-05-27 (×2): 1 mg via INTRAVENOUS

## 2024-05-27 MED ORDER — TRAVASOL 10 % IV SOLN
INTRAVENOUS | Status: AC
  Filled 2024-05-27: qty 739.2

## 2024-05-27 NOTE — Procedures (Signed)
 Pre procedural Dx: Mult abscesses Post procedural Dx: Same  Technically successful CT guided placed of a 10 Fr drainage catheter placement into the abdominal and pelvic abscesses yielding pus and stool.    A representative aspirated sample was capped and sent to the laboratory for analysis.    EBL: Trace Complications: None immediate  Zettie Hillock, MD Pager #: [phone]

## 2024-05-27 NOTE — Plan of Care (Signed)
  Problem: Education: Goal: Verbalization of understanding of the causes of altered bowel function will improve Outcome: Progressing   Problem: Bowel/Gastric: Goal: Gastrointestinal status for postoperative course will improve Outcome: Progressing   Problem: Nutritional: Goal: Will attain and maintain optimal nutritional status will improve Outcome: Not Progressing

## 2024-05-27 NOTE — Progress Notes (Signed)
 Referring Physician(s): Gross,Steven  Supervising Physician: Susan Ensign  Patient Status:  [facility] - In-pt  Chief Complaint: Abdominal pain, postop abdomino-pelvic abscesses;  status post drain placement x 2 on 05/20/2024 ; recent dislodgement of pelvic/transgluteal drain over weekend   Subjective: Pt resting quietly in bed; still has some rt sided abd pain; denies fever, resp issues,N/V or bleeding; her left transgluteal drain became dislodged over weekend; surgery now requesting pelvic/TG drain replacement as well as drainage of additional abd collection   Allergies: Elemental sulfur  Medications: Prior to Admission medications   Medication Sig Start Date End Date Taking? Authorizing Provider  amLODipine  (NORVASC ) 5 MG tablet Take 1 tablet (5 mg total) by mouth daily. Patient taking differently: Take 5 mg by mouth at bedtime. 04/08/24  Yes Patwardhan, Manish J, MD  ascorbic acid  (VITAMIN C ) 250 MG CHEW Chew 250 mg by mouth daily.   Yes [provider]  aspirin  EC 81 MG tablet Take 81 mg by mouth daily. Swallow whole.   Yes [provider]  cyanocobalamin  (VITAMIN B12) 1000 MCG tablet Take 1,000 mcg by mouth daily.   Yes [provider]  FIBER GUMMIES PO Take 1 capsule by mouth daily.   Yes [provider]  Multiple Vitamin (MULTIVITAMIN WITH MINERALS) TABS tablet Take 1 tablet by mouth daily. 11/10/23  Yes Kraig Peru, MD  Omega-3 Fatty Acids (OMEGA 3 500 PO) Take 500 mg by mouth daily.   Yes [provider]  OVER THE COUNTER MEDICATION Take 650 mg by mouth daily. Total Beets supplement   Yes [provider]  traMADol  (ULTRAM ) 50 MG tablet Take 1-2 tablets (50-100 mg total) by mouth every 6 (six) hours as needed for moderate pain (pain score 4-6) or severe pain (pain score 7-10). 05/08/24  Yes Candyce Champagne, MD     Vital Signs: BP 108/67 (BP Location: Left Arm)   Pulse 94   Temp 98.4 F (36.9 C) (Oral)   Resp 20    Ht 5' 5 (1.651 m)   Wt 138 lb 0.1 oz (62.6 kg)   SpO2 97%   BMI 22.97 kg/m   Physical Exam: awake/answers questions ok; chest- sl dim BS bases; heart- nl rate, ectopy noted; abd-sl dist,few BS, rt lower ant drain intact, site mild-mod tender, no LE edema  Imaging: CT ABDOMEN PELVIS W CONTRAST Result Date: 05/26/2024 CLINICAL DATA:  Abdominal pain, postop EXAM: CT ABDOMEN AND PELVIS WITH CONTRAST TECHNIQUE: Multidetector CT imaging of the abdomen and pelvis was performed using the standard protocol following bolus administration of intravenous contrast. RADIATION DOSE REDUCTION: This exam was performed according to the departmental dose-optimization program which includes automated exposure control, adjustment of the mA and/or kV according to patient size and/or use of iterative reconstruction technique. CONTRAST:  80mL OMNIPAQUE  IOHEXOL  300 MG/ML  SOLN COMPARISON:  None Available. FINDINGS: Lower chest: Small bilateral pleural effusions. Compressive atelectasis in the lower lobes. Hepatobiliary: No focal hepatic abnormality. Gallbladder unremarkable. Pancreas: No focal abnormality or ductal dilatation. Spleen: No focal abnormality.  Normal size. Adrenals/Urinary Tract: Adrenal glands normal. Punctate left lower pole nephrolithiasis. No hydronephrosis. Benign appearing right lower pole cyst measures 6 cm. No follow-up imaging recommended. Stomach/Bowel: Postsurgical changes in the rectum. Again noted are probable frank dehiscence changes. Air again noted around the rectum in the perirectal space. Multiple small fluid collections and air collections in the abdomen and pelvis. The largest is noted anteriorly measuring 4 x 2.7 cm on image 47. This is just deep  to and superior to and indwelling pigtail drainage catheter. Multiple other smaller fluid collections noted along the descending colon and sigmoid colon in the left lower quadrant and along small bowel loops in the right lower quadrant. No bowel  obstruction. Vascular/Lymphatic: Aortic atherosclerosis. No evidence of aneurysm or adenopathy. IVC filter in place. Reproductive: Prior hysterectomy.  No adnexal masses. Other: Trace free fluid in the pelvis. A few locules of extraluminal gas scattered throughout the abdomen and pelvis within the fluid collections described above. Musculoskeletal: No acute bony abnormality. IMPRESSION: Similar appearance at the rectal anastomosis concerning for dehiscence. Interval placement of pigtail drainage catheter anteriorly in the pelvis. There are several small fluid collections throughout the abdomen and pelvis with largest anteriorly in the midline of the pelvis measuring 4 x 2.7 cm. Small bilateral pleural effusions. Compressive atelectasis in the lower lobes. Aortic atherosclerosis. Electronically Signed   By: Janeece Mechanic M.D.   On: 05/26/2024 14:44    Labs:  CBC: Recent Labs    05/22/24 0407 05/24/24 0427 05/25/24 0300 05/26/24 0224  WBC 7.9 9.4 8.1 8.3  HGB 9.4* 9.3* 8.6* 8.7*  HCT 30.6* 30.3* 28.8* 28.4*  PLT 635* 635* 630* 628*    COAGS: Recent Labs    10/05/23 0247 10/11/23 1255 10/12/23 0655 05/19/24 2054  INR 1.1 1.1 1.1 1.2  APTT 30  --   --   --     BMP: Recent Labs    05/21/24 0331 05/22/24 0407 05/23/24 0500 05/24/24 0427 05/25/24 0300 05/26/24 0224  NA 136 136  --  132*  --  135  K 4.2 4.1  --  4.0 3.9 3.8  CL 104 104  --  101  --  104  CO2 25 24  --  25  --  24  GLUCOSE 131* 121*  --  118*  --  129*  BUN 15 29*  --  30*  --  20  CALCIUM  7.9* 7.8*  --  7.8*  --  7.9*  CREATININE 0.46 0.59 0.69 0.64 0.62 0.62  GFRNONAA >60 >60 >60 >60 >60 >60    LIVER FUNCTION TESTS: Recent Labs    05/20/24 0350 05/22/24 0407 05/24/24 0427 05/26/24 0224  BILITOT 1.2 0.8 1.0 0.7  AST 32 42* 26 31  ALT 47* 50* 39 37  ALKPHOS 269* 265* 226* 212*  PROT 5.8* 6.0* 6.2* 6.0*  ALBUMIN  <1.5* <1.5* <1.5* <1.5*    Assessment and Plan: Patient with history of complex  diverticular abscess and prior Hartman's resection and colostomy with small bowel resection in October of last year; status post robotic rectosigmoid resection /takedown of end colostomy with anastomosis /resection of small intestine with anastomosis, small bowel repair on 05/08/2024; now with postop abdomino-pelvic abscesses, status post drain placement x 2 on 05/20/2024 ; left TG drain became dislodged on 6/15; latest CT A/P reveals:   Similar appearance at the rectal anastomosis concerning for dehiscence.   Interval placement of pigtail drainage catheter anteriorly in the pelvis. There are several small fluid collections throughout the abdomen and pelvis with largest anteriorly in the midline of the pelvis measuring 4 x 2.7 cm.   Small bilateral pleural effusions. Compressive atelectasis in the lower lobes.   Aortic atherosclerosis   Pt afebrile, WBC nl, hgb stable, creat nl, PT/INR nl; drain fl cx- pseudomonas, MRSA, enterococcus; request now received from [facility] for TG drain replacement, additional ant abd drain placement; images have been reviewed by Dr. Kreg Pesa and benefits discussed with the patient/granddaughter/POA Nellie Banas  Casey  including bleeding, infection, damage to adjacent structures, bowel perforation/fistula connection, and sepsis. Procedure planned for today.  All of the patient's questions were answered, patient is agreeable to proceed. Consent signed and in chart.   Electronically Signed: D. Honore Lux, PA-C 05/27/2024, 9:46 AM   I spent a total of 20 minutes at the the patient's bedside AND on the patient's hospital floor or unit, greater than 50% of which was counseling/coordinating care for abdomino-pelvic abscess drainages    Patient ID: Elizabeth Odom, female   DOB: [birth year], 83 y.o.   MRN: [id number]

## 2024-05-27 NOTE — Sedation Documentation (Signed)
 V/S disrupted due to flipping of pt.

## 2024-05-27 NOTE — Progress Notes (Signed)
 PROGRESS NOTE    Elizabeth Odom  FBP:[id number] DOB: 1941-04-27 DOA: 05/08/2024 PCP: Anthon Kins, MD   Brief Narrative:  83 year old female with past medical history significant for remote history of A-fib evaluated with a loop recorder which was negative, prior DVT and small PE currently not on any anticoagulation because of some history of bleeding in the past had IVC filter which was subsequently removed at some point), anemia, PACs, HTN, mild cognitive impairment,  large bowel obstruction requiring colon resection and colostomy was admitted for colostomy takedown by general surgery.  Patient underwent elective robotic rectosigmoid resection, colostomy takedown with bowel anastomosis, extensive lysis of additions on 05/08/24 after preop cardiac clearance. On 05/09/24 evening, patient developed AMS after polypharmacy along with A-fib with RVR and TRH was consulted. Patient was transferred to stepdown unit. Course complicated by A-fib with RVR: Cardiology was consulted.  Subsequently, rate got controlled.  Cardiology signed off on 05/19/2024.  Eliquis  on hold.  Hospitalization also complicated by hypotension/rectal bleeding and syncope on 05/11/2024.  Patient also had postop ileus needing NG tube and TPN.  Imaging on 05/19/2024 showed possible abscesses.  She was started on IV antibiotics by general surgery on 05/20/2024.  She underwent IR guided drains placed on 05/20/2024.  Assessment & Plan:   Persistent A-fib/flutter - Currently rate controlled.  Continue metoprolol  succinate.  Cardiology signed off on 05/19/2024.  Recommended to restart Eliquis  when okay by surgery.  Outpatient follow-up with cardiology  Status post elective robotic rectosigmoid resection, colostomy takedown with bowel anastomosis, extensive lysis of additions on 05/08/24 with hospital course complicated by ileus and now possible intra-abdominal abscesses - Management as per primary surgical team.  Currently still getting  TPN. -Imaging on 05/19/2024 showed possible abscesses.  She underwent IR guided drains placed on 05/20/2024.  Currently on IV antibiotics and antifungal  Acute blood loss anemia - Status post 4 units packed red cell transfusion.  Hemoglobin 8.7 on 05/26/2024.  Monitor intermittently.  Acute kidney injury - Resolved  Hypoalbuminemia - Continue TPN  Leukocytosis - Resolved  Thrombocytosis - Possibly reactive.  Monitor intermittently  Hyponatremia -Improved  Syncope - Had syncope on 05/11/2024 1 patient was on bedside commode.  Possibly due to hypotension or vasovagal.  Echo with preserved LVEF.  No further syncope since then  Urinary retention--failed voiding trial.  Continue Flomax .  Continues to have Foley catheter.  Will need outpatient follow-up with urology  Acute metabolic encephalopathy/delirium - Resolved.  Monitor mental status.  Fall precautions.  Minimize opiates and sedatives  Elevated LFTs - Resolved  Subjective: Patient seen and examined at bedside.  Poor historian.  Had fever last night.  No seizures, agitation, vomiting reported. Objective: Vitals:   05/26/24 1508 05/26/24 2201 05/27/24 0600 05/27/24 0629  BP: (!) 113/57 127/83  108/67  Pulse: 86 (!) 105  94  Resp: 18 20  20   Temp: (!) 97.4 F (36.3 C) (!) 100.4 F (38 C)  98.4 F (36.9 C)  TempSrc: Oral Oral  Oral  SpO2: (!) 89% 96%  97%  Weight:   62.6 kg   Height:        Intake/Output Summary (Last 24 hours) at 05/27/2024 0800 Last data filed at 05/27/2024 0640 Gross per 24 hour  Intake 4867.64 ml  Output 20 ml  Net 4847.64 ml   Filed Weights   05/25/24 0500 05/26/24 0718 05/27/24 0600  Weight: 62.3 kg 63.4 kg 62.6 kg    Examination:  General: On room air.  No distress  currently.  Chronically ill and deconditioned looking. ENT/neck: No palpable thyromegaly or JVD elevation noted respiratory: Decreased breath sounds at bases bilaterally with some crackles CVS: Rate mostly controlled; S1 and S2  heard abdominal: Soft, tender and mildly distended; no organomegaly, normal bowel sounds heard.  Abdominal drains are present Extremities: Trace lower extremity edema present; no clubbing  CNS: Very slow to stand and a poor historian.  No focal deficits noted  lymph: No palpable lymphadenopathy Skin: No obvious ecchymosis/lesions  psych: Flat affect.  Currently not agitated. Musculoskeletal: No obvious joint swelling/deformity     Data Reviewed: I have personally reviewed following labs and imaging studies  CBC: Recent Labs  Lab 05/21/24 0331 05/22/24 0407 05/24/24 0427 05/25/24 0300 05/26/24 0224  WBC 9.2 7.9 9.4 8.1 8.3  HGB 9.7* 9.4* 9.3* 8.6* 8.7*  HCT 31.4* 30.6* 30.3* 28.8* 28.4*  MCV 92.1 94.2 94.1 94.7 92.2  PLT 600* 635* 635* 630* 628*   Basic Metabolic Panel: Recent Labs  Lab 05/21/24 0331 05/22/24 0407 05/23/24 0500 05/24/24 0427 05/25/24 0300 05/26/24 0224  NA 136 136  --  132*  --  135  K 4.2 4.1  --  4.0 3.9 3.8  CL 104 104  --  101  --  104  CO2 25 24  --  25  --  24  GLUCOSE 131* 121*  --  118*  --  129*  BUN 15 29*  --  30*  --  20  CREATININE 0.46 0.59 0.69 0.64 0.62 0.62  CALCIUM  7.9* 7.8*  --  7.8*  --  7.9*  MG  --  2.0  --  1.9  --  1.9  PHOS  --  3.8  --  3.2  --  3.4   GFR: Estimated Creatinine Clearance: 47.9 mL/min (by C-G formula based on SCr of 0.62 mg/dL). Liver Function Tests: Recent Labs  Lab 05/22/24 0407 05/24/24 0427 05/26/24 0224  AST 42* 26 31  ALT 50* 39 37  ALKPHOS 265* 226* 212*  BILITOT 0.8 1.0 0.7  PROT 6.0* 6.2* 6.0*  ALBUMIN  <1.5* <1.5* <1.5*   No results for input(s): LIPASE, AMYLASE in the last 168 hours. No results for input(s): AMMONIA in the last 168 hours. Coagulation Profile: No results for input(s): INR, PROTIME in the last 168 hours.  Cardiac Enzymes: No results for input(s): CKTOTAL, CKMB, CKMBINDEX, TROPONINI in the last 168 hours. BNP (last 3 results) No results for  input(s): PROBNP in the last 8760 hours. HbA1C: No results for input(s): HGBA1C in the last 72 hours. CBG: Recent Labs  Lab 05/20/24 1816 05/21/24 0209 05/21/24 0754 05/21/24 1615  GLUCAP 130* 144* 164* 134*   Lipid Profile: Recent Labs    05/26/24 0224  TRIG 82    Thyroid  Function Tests: No results for input(s): TSH, T4TOTAL, FREET4, T3FREE, THYROIDAB in the last 72 hours. Anemia Panel: No results for input(s): VITAMINB12, FOLATE, FERRITIN, TIBC, IRON, RETICCTPCT in the last 72 hours. Sepsis Labs: No results for input(s): PROCALCITON, LATICACIDVEN in the last 168 hours.  Recent Results (from the past 240 hours)  Aerobic/Anaerobic Culture w Gram Stain (surgical/deep wound)     Status: None (Preliminary result)   Collection Time: 05/22/24  3:59 PM   Specimen: Abdomen  Result Value Ref Range Status   Specimen Description   Final    ABDOMEN Performed at [facility], [address] [place], [place] [address]    Special Requests   Final    ABDOMINAL  DRAIN Performed at [facility], [address], [place], [place] [address]    Gram Stain   Final    ABUNDANT WBC PRESENT, PREDOMINANTLY PMN RARE GRAM POSITIVE COCCI RARE YEAST WITH PSEUDOHYPHAE Performed at [facility], [address] [place], [place] [address]    Culture   Final    MODERATE PSEUDOMONAS AERUGINOSA MODERATE STAPHYLOCOCCUS AUREUS MODERATE CANDIDA ALBICANS NO ANAEROBES ISOLATED; CULTURE IN PROGRESS FOR 5 DAYS    Report Status PENDING  Incomplete   Organism ID, Bacteria PSEUDOMONAS AERUGINOSA  Final      Susceptibility   Pseudomonas aeruginosa - MIC*    CEFTAZIDIME 4 SENSITIVE Sensitive     CIPROFLOXACIN <=0.25 SENSITIVE Sensitive     GENTAMICIN 4 SENSITIVE Sensitive     IMIPENEM 2 SENSITIVE Sensitive     PIP/TAZO <=4 SENSITIVE Sensitive ug/mL    CEFEPIME 2 SENSITIVE Sensitive     * MODERATE PSEUDOMONAS AERUGINOSA   Aerobic/Anaerobic Culture w Gram Stain (surgical/deep wound)     Status: None (Preliminary result)   Collection Time: 05/22/24  3:59 PM   Specimen: Back  Result Value Ref Range Status   Specimen Description   Final    BACK Performed at [facility], [address], [place], [place] [address]    Special Requests   Final    TRANSGLUTEAL PERCUTANEOUS IR DRAIN Performed at [facility], [address] [place], [place] [address]    Gram Stain   Final    NO WBC SEEN ABUNDANT GRAM NEGATIVE RODS RARE GRAM POSITIVE COCCI IN PAIRS RARE BUDDING YEAST SEEN Performed at [facility], [address] [place], [place] [address]    Culture   Final    ABUNDANT PSEUDOMONAS AERUGINOSA ABUNDANT METHICILLIN RESISTANT STAPHYLOCOCCUS AUREUS ABUNDANT ENTEROCOCCUS FAECALIS ABUNDANT CANDIDA ALBICANS NO ANAEROBES ISOLATED; CULTURE IN PROGRESS FOR 5 DAYS    Report Status PENDING  Incomplete   Organism ID, Bacteria PSEUDOMONAS AERUGINOSA  Final   Organism ID, Bacteria METHICILLIN RESISTANT STAPHYLOCOCCUS AUREUS  Final   Organism ID, Bacteria ENTEROCOCCUS FAECALIS  Final      Susceptibility   Enterococcus faecalis - MIC*    AMPICILLIN <=2 SENSITIVE Sensitive     VANCOMYCIN  1 SENSITIVE Sensitive     GENTAMICIN SYNERGY SENSITIVE Sensitive     * ABUNDANT ENTEROCOCCUS FAECALIS   Methicillin resistant staphylococcus aureus - MIC*    CIPROFLOXACIN >=8 RESISTANT Resistant     ERYTHROMYCIN  >=8 RESISTANT Resistant     GENTAMICIN <=0.5 SENSITIVE Sensitive     OXACILLIN >=4 RESISTANT Resistant     TETRACYCLINE <=1 SENSITIVE Sensitive     VANCOMYCIN  1 SENSITIVE Sensitive     TRIMETH/SULFA >=320 RESISTANT Resistant     CLINDAMYCIN <=0.25 SENSITIVE Sensitive     RIFAMPIN <=0.5 SENSITIVE Sensitive     Inducible Clindamycin NEGATIVE Sensitive     LINEZOLID 2 SENSITIVE Sensitive     * ABUNDANT METHICILLIN RESISTANT STAPHYLOCOCCUS AUREUS   Pseudomonas aeruginosa - MIC*     CEFTAZIDIME 4 SENSITIVE Sensitive     CIPROFLOXACIN <=0.25 SENSITIVE Sensitive     GENTAMICIN 4 SENSITIVE Sensitive     IMIPENEM 2 SENSITIVE Sensitive     PIP/TAZO <=4 SENSITIVE Sensitive ug/mL    CEFEPIME 2 SENSITIVE Sensitive     * ABUNDANT PSEUDOMONAS AERUGINOSA         Radiology Studies: CT ABDOMEN PELVIS W CONTRAST Result Date: 05/26/2024 CLINICAL DATA:  Abdominal pain, postop EXAM: CT ABDOMEN AND PELVIS  WITH CONTRAST TECHNIQUE: Multidetector CT imaging of the abdomen and pelvis was performed using the standard protocol following bolus administration of intravenous contrast. RADIATION DOSE REDUCTION: This exam was performed according to the departmental dose-optimization program which includes automated exposure control, adjustment of the mA and/or kV according to patient size and/or use of iterative reconstruction technique. CONTRAST:  80mL OMNIPAQUE  IOHEXOL  300 MG/ML  SOLN COMPARISON:  None Available. FINDINGS: Lower chest: Small bilateral pleural effusions. Compressive atelectasis in the lower lobes. Hepatobiliary: No focal hepatic abnormality. Gallbladder unremarkable. Pancreas: No focal abnormality or ductal dilatation. Spleen: No focal abnormality.  Normal size. Adrenals/Urinary Tract: Adrenal glands normal. Punctate left lower pole nephrolithiasis. No hydronephrosis. Benign appearing right lower pole cyst measures 6 cm. No follow-up imaging recommended. Stomach/Bowel: Postsurgical changes in the rectum. Again noted are probable frank dehiscence changes. Air again noted around the rectum in the perirectal space. Multiple small fluid collections and air collections in the abdomen and pelvis. The largest is noted anteriorly measuring 4 x 2.7 cm on image 47. This is just deep to and superior to and indwelling pigtail drainage catheter. Multiple other smaller fluid collections noted along the descending colon and sigmoid colon in the left lower quadrant and along small bowel loops in the  right lower quadrant. No bowel obstruction. Vascular/Lymphatic: Aortic atherosclerosis. No evidence of aneurysm or adenopathy. IVC filter in place. Reproductive: Prior hysterectomy.  No adnexal masses. Other: Trace free fluid in the pelvis. A few locules of extraluminal gas scattered throughout the abdomen and pelvis within the fluid collections described above. Musculoskeletal: No acute bony abnormality. IMPRESSION: Similar appearance at the rectal anastomosis concerning for dehiscence. Interval placement of pigtail drainage catheter anteriorly in the pelvis. There are several small fluid collections throughout the abdomen and pelvis with largest anteriorly in the midline of the pelvis measuring 4 x 2.7 cm. Small bilateral pleural effusions. Compressive atelectasis in the lower lobes. Aortic atherosclerosis. Electronically Signed   By: Janeece Mechanic M.D.   On: 05/26/2024 14:44         Scheduled Meds:  acetaminophen   500 mg Oral TID WC & HS   ascorbic acid   250 mg Oral Daily   Chlorhexidine  Gluconate Cloth  6 each Topical Daily   cyanocobalamin   1,000 mcg Oral Daily   feeding supplement  1 Container Oral TID BM   liver oil-zinc  oxide   Topical BID   loperamide   4 mg Oral BID   metoprolol  succinate  12.5 mg Oral Daily   multivitamin with minerals  1 tablet Oral Daily   mupirocin  ointment  1 Application Nasal BID   pantoprazole   40 mg Oral Daily   psyllium  1 packet Oral BID   sodium chloride  flush  10-40 mL Intracatheter Q12H   sodium chloride  flush  3 mL Intravenous Q12H   sodium chloride  flush  5 mL Intracatheter Q8H   tamsulosin   0.4 mg Oral QPC supper   Continuous Infusions:  sodium chloride  10 mL/hr at 05/26/24 2028   micafungin (MYCAMINE) 100 mg in sodium chloride  0.9 % 100 mL IVPB Stopped (05/26/24 1900)   piperacillin -tazobactam (ZOSYN )  IV 3.375 g (05/27/24 0510)   TPN ADULT (ION) 70 mL/hr at 05/26/24 1751   vancomycin             Eligha Kmetz Maury Space, MD [facility] 05/27/2024, 8:00 AM

## 2024-05-27 NOTE — Progress Notes (Signed)
 Physical Therapy Treatment Patient Details Name: Elizabeth Odom MRN: [id number] DOB: Apr 15, 1941 Today's Date: 05/27/2024   History of Present Illness 83 y.o. female s/p rectosigmoid resection takedown on 5/29. Course complicated by A-fib with RVR and then hypotension/rectal bleeding and syncope on 05/11/2024.  Patient  had postop ileus needing NG tube and TPN.  Imaging on 05/19/2024 showed possible abscesses. S/P IR guided drains placed on 05/20/2024.  PMH includes:  hypertension, history of partial resection of the colon with colostomy, remote history of atrial fibrillation, PACs, prior DVT and small PE, S/P IVC filter which was subsequently removed and hyperlipidemia.    PT Comments   The patient did participate in mobility with much encouragement. Patient mobilized to standing at Rw with onset of incontinence of B/B. Assisted back into bed and was washed up. RN in room. Continue PT. Patient will benefit from continued inpatient follow up therapy, <3 hours/day.    If plan is discharge home, recommend the following: Two people to help with walking and/or transfers;Two people to help with bathing/dressing/bathroom;Assistance with cooking/housework;Help with stairs or ramp for entrance;Assist for transportation   Can travel by private vehicle     No  Equipment Recommendations  None recommended by PT    Recommendations for Other Services       Precautions / Restrictions Precautions Precautions: Fall Recall of Precautions/Restrictions: Impaired Precaution/Restrictions Comments: ABD surgery,  abdominal JP, incontinence B/B Restrictions Weight Bearing Restrictions Per Provider Order: No     Mobility  Bed Mobility   Bed Mobility: Rolling, Sidelying to Sit, Sit to Supine Rolling: Max assist, Mod assist Sidelying to sit: Mod assist   Sit to supine: Mod assist   General bed mobility comments: initial roll to left with mod support and cues for position, mod support to sit upright, mod  support to return to side.  max  assist to roll when changing bed pads    Transfers Overall transfer level: Needs assistance Equipment used: Rolling walker (2 wheels) Transfers: Sit to/from Stand Sit to Stand: Mod assist Stand pivot transfers: Mod assist, +2 safety/equipment         General transfer comment: patient stood briefly, incontinent of B/B so sat back down, returned to sidelying in bed for hygiene    Ambulation/Gait                   Stairs             Wheelchair Mobility     Tilt Bed    Modified Rankin (Stroke Patients Only)       Balance   Sitting-balance support: Bilateral upper extremity supported, Feet supported Sitting balance-Leahy Scale: Fair     Standing balance support: Bilateral upper extremity supported, During functional activity, Reliant on assistive device for balance Standing balance-Leahy Scale: Poor                              Communication Communication Communication: No apparent difficulties  Cognition Arousal: Alert Behavior During Therapy: Flat affect   PT - Cognitive impairments: Initiation, Attention, Sequencing                       PT - Cognition Comments: much encouragement to mobilize   Following commands impaired: Follows one step commands with increased time    Cueing Cueing Techniques: Verbal cues, Tactile cues  Exercises      General Comments  Pertinent Vitals/Pain Pain Assessment Faces Pain Scale: Hurts even more Pain Location: bottom and abdomen with repositioning and  hygiene Pain Descriptors / Indicators: Grimacing, Constant, Discomfort Pain Intervention(s): Monitored during session, Repositioned    Home Living                          Prior Function            PT Goals (current goals can now be found in the care plan section) Progress towards PT goals: Progressing toward goals    Frequency    Min 2X/week      PT Plan       Co-evaluation PT/OT/SLP Co-Evaluation/Treatment: Yes Reason for Co-Treatment: Complexity of the patient's impairments (multi-system involvement);For patient/therapist safety;To address functional/ADL transfers PT goals addressed during session: Mobility/safety with mobility OT goals addressed during session: ADL's and self-care;Strengthening/ROM;Proper use of Adaptive equipment and DME      AM-PAC PT 6 Clicks Mobility   Outcome Measure  Help needed turning from your back to your side while in a flat bed without using bedrails?: A Lot Help needed moving from lying on your back to sitting on the side of a flat bed without using bedrails?: A Lot Help needed moving to and from a bed to a chair (including a wheelchair)?: A Lot Help needed standing up from a chair using your arms (e.g., wheelchair or bedside chair)?: Total Help needed to walk in hospital room?: Total Help needed climbing 3-5 steps with a railing? : Total 6 Click Score: 9    End of Session   Activity Tolerance: Treatment limited secondary to medical complications (Comment) (diarrhea) Patient left: in bed;with call bell/phone within reach;with bed alarm set Nurse Communication: Mobility status PT Visit Diagnosis: Muscle weakness (generalized) (M62.81);Difficulty in walking, not elsewhere classified (R26.2)     Time: 5409-8119 PT Time Calculation (min) (ACUTE ONLY): 17 min  Charges:    $Therapeutic Activity: 8-22 mins PT General Charges $$ ACUTE PT VISIT: 1 Visit                    Abelina Hoes PT Acute Rehabilitation Services Office [phone]    Dareen Ebbing 05/27/2024, 12:33 PM

## 2024-05-27 NOTE — Progress Notes (Signed)
 PHARMACY - TOTAL PARENTERAL NUTRITION CONSULT NOTE   Indication: Prolonged ileus  Patient Measurements: Height: 5' 5 (165.1 cm) Weight: 62.6 kg (138 lb 0.1 oz) IBW/kg (Calculated) : 57 TPN AdjBW (KG): 61.3 Body mass index is 22.97 kg/m.  Assessment: 64 yoF with history of diverticulitis with perforation and abscess. 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 She requested ostomy takedown and underwent LOA, colostomy takedown, small bowel resection with anastomosis on 5/29. Pharmacy is consulted to dose TPN starting 6/2 for postop ileus.  Now with anastomotic leak complicated by multiple-intraabdominal abscesses.  *Last TPN labs obtained 6/16* Glucose / Insulin : no Hx DM, A1c 5.1%. BG goal 100-180. - CBG check and SSI discontinued on 6/11. Electrolytes: All WNL, including CorrCa (9.9) Hepatic: LFTs, Tbili, TG WNL.  -Albumin  low. Alk Phos trending down I/O: No mIVF. -PO intake: 2320 mL (increased) -UOP: x11, stool x12, drain: 20 mL GI Imaging:  - 6/9 CT: [name] dehiscence along the rectal anastomosis, free air and multiple fluid collections GI Surgeries / Procedures: - 5/29: LAR, SBR, colostomy takedown, LOA - 6/10 drains placed x 2 in IR (midline abd, transgluteal) - 6/17: Planning IR replacement of transgluteal drain that became dislodged  Central access: Double lumen PICC placed 6/2 TPN start date: 6/2  Nutritional Goals: - Goal TPN rate is 70 mL/hr (provides 94 g of protein and 1680 kcals per day)  RD Assessment:  Estimated Needs Total Energy Estimated Needs: 1600-1750 kcals Total Protein Estimated Needs: 80-95 grams Total Fluid Estimated Needs: >/= 1.6L  Current Nutrition:  - TPN at goal - Diet advanced to heart healthy   Calorie count completed from 6/13 > 6/16. Not meeting needs. -Pt continues to refuse all Boost supplementation, Magic  cup.  Plan:  Received orders from [facility] to begin gradually weaning TPN.  At 18:00 Decrease TPN to 55 mL/hr Will provide 74 g protein, 1320 kcal Electrolytes in TPN: No change Na 150 mEq/L (max) K 50 mEq/L Ca 5 mEq/L Mg 5 mEq/L Phos 15 mmol/L Cl:Ac 1:1 No CBG checks/SSI  MVI PO Monitor TPN labs on Mon/Thurs. Recheck electrolytes with AM labs tomorrow.   Shireen Dory, PharmD 05/27/24 10:16 AM

## 2024-05-27 NOTE — Sedation Documentation (Signed)
 Flipping pt to prone position for 2nd drain placement.

## 2024-05-27 NOTE — Progress Notes (Addendum)
 05/27/2024  Elizabeth Odom [id number] 11-09-1941  CARE TEAM: PCP: Anthon Kins, MD  Outpatient Care Team: Patient Care Team: Anthon Kins, MD as PCP - General (Internal Medicine) Cody Das, MD as PCP - Cardiology (Cardiology) Lajuan Pila, MD as Consulting Physician (Gastroenterology) Shela Derby, MD as Consulting Physician (General Surgery) Glory Larsen, MD (Neurology) Lahoma Pigg, MD as Consulting Physician (Urology) Sandee Crook Margart Shears, MD as Consulting Physician (Cardiology) Candyce Champagne, MD as Consulting Physician (Colon and Rectal Surgery)  Inpatient Treatment Team: Treatment Team:  Candyce Champagne, MD Ruel Cotta, Heinz Llano, MD Ozell Blunt, MD Vladimir [facility]ves [facility], MD Lexine Redder, MD Audria Leather, MD Allred, Boyd Cabal, PA-C Mugweru, Jon, MD Barrington Bores, NT [place], RN Jame Maze, OT Jari Merles, RN Holley Lung, NT [place], Leeta Puls, PT Shireen Dory, [facility]   Problem List:   Principal Problem:   Diverticular disease Active Problems:   A-fib [facility])   Restless leg   ABLA (acute blood loss anemia)   Need for emotional support   Acute urinary retention   Mixed hyperlipidemia   Essential hypertension   Diverticular disease of left colon   History of DVT (deep vein thrombosis)   History of pulmonary embolus (PE)   Pre-diabetes   Presence of IVC filter   Stricture of sigmoid colon (HCC)   Protein-calorie malnutrition, severe   Hypokalemia   05/08/2024  POST-OPERATIVE DIAGNOSIS:   COLOSTOMY FOR RESECTION, DESIRE FOR OSTOMY TAKEDOWN RECTAL STRICTURE HISTORY OF DIVERTICULITIS WITH PERFORATION & ABSCESS   PROCEDURE:   -ROBOTIC RECTOSIGMOID RESECTION (LAR) -TAKEDOWN OF END COLOSTOMY WITH ANASTOMOSIS -RESECTION OF SMALL INTESTINE WITH ANASTOMOSIS -SMALL BOWEL REPAIR -LYSIS OF ADHESIONS x 115 MINUTES (66% OF CASE),  -INTRAOPERATIVE ASSESSMENT OF TISSUE VASCULAR PERFUSION USING ICG  (indocyanine green ) IMMUNOFLUORESCENCE,  -TRANSVERSUS ABDOMINIS PLANE (TAP) BLOCK - BILATERAL -FLEXIBLE SIGMOIDOSCOPY   SURGEON:  Eddye Goodie, MD  OR FINDINGS:  Very dense adhesions of small bowel especially to pelvis and retroperitoneum.  Extremely concrete dense adhesions with friable tissue near her old prior small bowel anastomosis and distal jejunum.  Required small bowel repair and required small bowel resection including old anastomosis given severe tissues.  Patient had significant fibrotic stricturing of rectal stump at the mid/distal rectal junction.  Could not be released up.  Ended up doing resection of rectal stump to mid/distal rectal junction.  No obvious metastatic disease on visceral parietal peritoneum or liver.   It is a 29mm EEA anastomosis ( distal descending colon  connected to mid/distal rectal junction.)  It rests 7 cm from the anal verge by rigid proctoscopy.  05/20/2024  Post procedural Dx: Post op abscess, multiple   Technically successful CT guided placed of a  10 Fr drainage catheter placement x 2  into the midline ventral and dorsal perirectal abscesses.     Rosetta Cons, MD    Assessment [facility] Stay = 19 days) 19 Days Post-Op    Ileus resolving   Fluid collections raises suspicion of contained leak s/p drainage 6/10    Plan:  Drain fell out in the transgluteal region.  It was closest to the contained anastomotic leak.  Ideally, it needs to be replaced to better control and contain it.  I put an order for interventional radiology to replace it.    Holding her Eliquis .  Last dose 6/15 yesterday.   Hopefully they can do it today, 6/17.  Repeat CAT scan done yesterday.  Do not  know if there is a great window in the pelvis but see if they can.  The abdominal drain gas pocket is collapsed but there is another abscess nearby.  Ideally they could redirect or place a new drain into that final dominant abscess cavity to get that under  control.  I went down to interventional radiology to find the team but without any success.  Patient is on the board, scheduled at the end of the day.  Updated the interventional radiology team they can call or reach me if there are further questions or concerns.  Hopefully drain placement can happen today.      Antidiarrheal regimen -  Improved with psyllium and loperamide .    Growing numerous organisms including Pseudomonas, MRSA and Candida albicans.    Vancomycin , piperacillin /tazobactam and micafungin.    Would be helpful to get sensitivities on C. albicans to make sure it is not resistant.  Anticipate 14-day course of antibiotics for now given her malnourished and elderly state and significant pelvic contamination.  Pain control without over-sedating her.  Tramadol , Flexeril, and gabapentin  since the opioids make her rather sedated/groggy.  That seems to be working  TPN for severe protein malnutrition.  Prealbumin starting to climb in more normal range which is a guardedly hopeful sign.  Eating more but very picky eater.  Try to wean TPN down to half rate.  If gets drain replaced and doing well hopefully wean off in next day.  Foley catheter out with no problems with urination now that pelvic collection under better control.  Continue tamsulosin  if fails then replace and leave for several weeks until can follow-up with urology as an outpatient.  Otherwise try and get out so she does not have a risk of UTIs   No rectal tubes.  Atrial fibrillation postop.  History of a prior event.    Cardiology signed off for now.  Reconsult as needed.  Stop telemetry for now  Restarted DOAC Eliquis  6/12 after prior hematochezia.  No strong evidence of that.  Follow hemoglobin.  Holding Eliquis  6/15 in anticipation of drain replacement 6/17, and restart if medicine agrees  Rate controlled switch from amlodipine  to metoprolol .  Less tachycardic.  Blood pressure soft.  Back down 12.5 mg of  metoprolol  and follow with PRN (as needed) breakthrough  -monitor electrolytes & replace as needed.  Keep K>4, Mg>2, Phos>3  -VTE prophylaxis- SCDs.  Anticoagulation as appropriate  -mobilize as tolerated to help recovery.  Enlist therapies in moderate/high risk patients as appropriate.  Patient is refusing skilled facility or any rehab but is not getting up much.  Try to have strategies to help her get up and mobilize and actually work with physical therapy, or she is going to lose her argument.    I updated the patient's status to the patient and nurse.  Recommendations were made.  Questions were answered.  They expressed understanding & appreciation.  -Disposition: TBD      I reviewed last 24 h vitals and pain scores, last 48 h intake and output, last 24 h labs and trends, and last 24 h imaging results.  I have reviewed this patient's available data, including medical history, events of note, test results, etc as part of my evaluation.   A significant portion of that time was spent in counseling. Care during the described time interval was provided by me.  This care required moderate level of medical decision making.  05/27/2024    Subjective: (Chief complaint)  CT scan done.  Patient feels diarrhea under better control now.  Thirsty and hungry.  Denies much pain.  Floor nurse just outside room.  Objective:  Vital signs:  Vitals:   05/26/24 1508 05/26/24 2201 05/27/24 0600 05/27/24 0629  BP: (!) 113/57 127/83  108/67  Pulse: 86 (!) 105  94  Resp: 18 20  20   Temp: (!) 97.4 F (36.3 C) (!) 100.4 F (38 C)  98.4 F (36.9 C)  TempSrc: Oral Oral  Oral  SpO2: (!) 89% 96%  97%  Weight:   62.6 kg   Height:        Last BM Date : 05/26/24  Intake/Output   Yesterday:  06/16 0701 - 06/17 0700 In: 5107.6 [P.O.:2320; I.V.:2521.8; IV Piggyback:260.9] Out: 20 [Drains:20] This shift:  No intake/output data recorded.  Bowel function:  Flatus: YES  BM:   YES  Drain:  Right lower quadrant drain: Scant purulent  Physical Exam:  General: Awake and alert.  Conversational.  Somewhat perseverating, repeating questions but I think she is use of hearing.       Eyes: PERRL, normal EOM.  Sclera clear.  No icterus Neuro: CN II-XII intact w/o focal sensory/motor deficits.  All extremities.  No facial droop. Lymph: No head/neck/groin lymphadenopathy Psych:  No delerium/psychosis/paranoia.  Oriented x 4 HENT: Normocephalic, Mucus membranes moist.  No thrush.  Mild HOH Neck: Supple, No tracheal deviation.  No obvious thyromegaly.  Prefers to sit with head flexed and chin down.  Does not want to have her neck back.  Pillow adjusted. Chest: No pain to chest wall compression.  Good respiratory excursion.  No audible wheezing CV:  Pulses intact.  Regular rhythm.  No major extremity edema MS: Normal AROM mjr joints.  No obvious deformity  Abdomen:  Soft.  Moderately distended.  Nontender.  No guarding.  No complaints of pain to percussion or benching or cough.  No evidence of peritonitis. Old ostomy incision with normal healing ridge.    GU: No Foley Rectal: deferred today  Ext:   No deformity.  No mjr edema.  No cyanosis Skin: No petechiae / purpurea.  No major sores.  Warm and dry    Results:   Cultures: Recent Results (from the past 720 hours)  Urine Culture     Status: Abnormal   Collection Time: 05/09/24  8:30 PM   Specimen: Urine, Clean Catch  Result Value Ref Range Status   Specimen Description   Final    URINE, CLEAN CATCH Performed at [facility], [address], [place], [place] [address]    Special Requests   Final    NONE Performed at [facility], [address], [place], [place] [address]    Culture (A)  Final    <10,000 COLONIES/mL INSIGNIFICANT GROWTH Performed at [facility], [address] [place], [place] [address]    Report Status 05/11/2024 FINAL  Final   Aerobic/Anaerobic Culture w Gram Stain (surgical/deep wound)     Status: None (Preliminary result)   Collection Time: 05/22/24  3:59 PM   Specimen: Abdomen  Result Value Ref Range Status   Specimen Description   Final    ABDOMEN Performed at [facility], [address], [place], [place] [address]    Special Requests   Final    ABDOMINAL DRAIN Performed at [facility], [address] [place], [place] [address]    Gram Stain   Final    ABUNDANT WBC PRESENT, PREDOMINANTLY  PMN RARE GRAM POSITIVE COCCI RARE YEAST WITH PSEUDOHYPHAE Performed at [facility], [address] [place], [place] [address]    Culture   Final    MODERATE PSEUDOMONAS AERUGINOSA MODERATE STAPHYLOCOCCUS AUREUS MODERATE CANDIDA ALBICANS NO ANAEROBES ISOLATED; CULTURE IN PROGRESS FOR 5 DAYS    Report Status PENDING  Incomplete   Organism ID, Bacteria PSEUDOMONAS AERUGINOSA  Final      Susceptibility   Pseudomonas aeruginosa - MIC*    CEFTAZIDIME 4 SENSITIVE Sensitive     CIPROFLOXACIN <=0.25 SENSITIVE Sensitive     GENTAMICIN 4 SENSITIVE Sensitive     IMIPENEM 2 SENSITIVE Sensitive     PIP/TAZO <=4 SENSITIVE Sensitive ug/mL    CEFEPIME 2 SENSITIVE Sensitive     * MODERATE PSEUDOMONAS AERUGINOSA  Aerobic/Anaerobic Culture w Gram Stain (surgical/deep wound)     Status: None (Preliminary result)   Collection Time: 05/22/24  3:59 PM   Specimen: Back  Result Value Ref Range Status   Specimen Description   Final    BACK Performed at [facility], [address], [place], [place] [address]    Special Requests   Final    TRANSGLUTEAL PERCUTANEOUS IR DRAIN Performed at [facility], [address] [place], [place] [address]    Gram Stain   Final    NO WBC SEEN ABUNDANT GRAM NEGATIVE RODS RARE GRAM POSITIVE COCCI IN PAIRS RARE BUDDING YEAST SEEN Performed at [facility], [address], [place], [place] [address]     Culture   Final    ABUNDANT PSEUDOMONAS AERUGINOSA ABUNDANT METHICILLIN RESISTANT STAPHYLOCOCCUS AUREUS ABUNDANT ENTEROCOCCUS FAECALIS ABUNDANT CANDIDA ALBICANS NO ANAEROBES ISOLATED; CULTURE IN PROGRESS FOR 5 DAYS    Report Status PENDING  Incomplete   Organism ID, Bacteria PSEUDOMONAS AERUGINOSA  Final   Organism ID, Bacteria METHICILLIN RESISTANT STAPHYLOCOCCUS AUREUS  Final   Organism ID, Bacteria ENTEROCOCCUS FAECALIS  Final      Susceptibility   Enterococcus faecalis - MIC*    AMPICILLIN <=2 SENSITIVE Sensitive     VANCOMYCIN  1 SENSITIVE Sensitive     GENTAMICIN SYNERGY SENSITIVE Sensitive     * ABUNDANT ENTEROCOCCUS FAECALIS   Methicillin resistant staphylococcus aureus - MIC*    CIPROFLOXACIN >=8 RESISTANT Resistant     ERYTHROMYCIN  >=8 RESISTANT Resistant     GENTAMICIN <=0.5 SENSITIVE Sensitive     OXACILLIN >=4 RESISTANT Resistant     TETRACYCLINE <=1 SENSITIVE Sensitive     VANCOMYCIN  1 SENSITIVE Sensitive     TRIMETH/SULFA >=320 RESISTANT Resistant     CLINDAMYCIN <=0.25 SENSITIVE Sensitive     RIFAMPIN <=0.5 SENSITIVE Sensitive     Inducible Clindamycin NEGATIVE Sensitive     LINEZOLID 2 SENSITIVE Sensitive     * ABUNDANT METHICILLIN RESISTANT STAPHYLOCOCCUS AUREUS   Pseudomonas aeruginosa - MIC*    CEFTAZIDIME 4 SENSITIVE Sensitive     CIPROFLOXACIN <=0.25 SENSITIVE Sensitive     GENTAMICIN 4 SENSITIVE Sensitive     IMIPENEM 2 SENSITIVE Sensitive     PIP/TAZO <=4 SENSITIVE Sensitive ug/mL    CEFEPIME 2 SENSITIVE Sensitive     * ABUNDANT PSEUDOMONAS AERUGINOSA    Labs: Results for orders placed or performed during the hospital encounter of 05/08/24 (from the past 48 hours)  Prealbumin     Status: Abnormal   Collection Time: 05/26/24  2:24 AM  Result Value Ref Range   Prealbumin 16 (L) 18 - 38 mg/dL    Comment: Performed at [facility], [address] [place], [place] [address]  Comprehensive metabolic panel     Status: Abnormal   Collection  Time: 05/26/24  2:24 AM  Result Value Ref Range   Sodium 135 135 - 145 mmol/L   Potassium 3.8 3.5 - 5.1 mmol/L   Chloride 104 98 - 111 mmol/L   CO2 24 22 - 32 mmol/L   Glucose, Bld 129 (H) 70 - 99 mg/dL    Comment: Glucose reference range applies only to samples taken after fasting for at least 8 hours.   BUN 20 8 - 23 mg/dL   Creatinine, Ser 8.29 0.44 - 1.00 mg/dL   Calcium  7.9 (L) 8.9 - 10.3 mg/dL   Total Protein 6.0 (L) 6.5 - 8.1 g/dL   Albumin  <1.5 (L) 3.5 - 5.0 g/dL   AST 31 15 - 41 U/L   ALT 37 0 - 44 U/L   Alkaline Phosphatase 212 (H) 38 - 126 U/L   Total Bilirubin 0.7 0.0 - 1.2 mg/dL   GFR, Estimated >56 >21 mL/min    Comment: (NOTE) Calculated using the CKD-EPI Creatinine Equation (2021)    Anion gap 7 5 - 15    Comment: Performed at [facility], [address] [place], [place] [address]  Magnesium      Status: None   Collection Time: 05/26/24  2:24 AM  Result Value Ref Range   Magnesium  1.9 1.7 - 2.4 mg/dL    Comment: Performed at [facility], [address], [place], [place] [address]  Phosphorus     Status: None   Collection Time: 05/26/24  2:24 AM  Result Value Ref Range   Phosphorus 3.4 2.5 - 4.6 mg/dL    Comment: Performed at [facility], [address], [place], [place] [address]  Triglycerides     Status: None   Collection Time: 05/26/24  2:24 AM  Result Value Ref Range   Triglycerides 82 <150 mg/dL    Comment: Performed at [facility], [address], [place], [place] [address]  CBC     Status: Abnormal   Collection Time: 05/26/24  2:24 AM  Result Value Ref Range   WBC 8.3 4.0 - 10.5 K/uL   RBC 3.08 (L) 3.87 - 5.11 MIL/uL   Hemoglobin 8.7 (L) 12.0 - 15.0 g/dL   HCT 44.0 (L) 10.2 - 72.5 %   MCV 92.2 80.0 - 100.0 fL   MCH 28.2 26.0 - 34.0 pg   MCHC 30.6 30.0 - 36.0 g/dL   RDW 36.6 (H) 44.0 - 34.7 %   Platelets 628 (H) 150 - 400 K/uL   nRBC 0.0 0.0 - 0.2 %    Comment: Performed at  [facility], [address], [place], [place] [address]    Imaging / Studies: CT ABDOMEN PELVIS W CONTRAST Result Date: 05/26/2024 CLINICAL DATA:  Abdominal pain, postop EXAM: CT ABDOMEN AND PELVIS WITH CONTRAST TECHNIQUE: Multidetector CT imaging of the abdomen and pelvis was performed using the standard protocol following bolus administration of intravenous contrast. RADIATION DOSE REDUCTION: This exam was performed according to the departmental dose-optimization program which includes automated exposure control, adjustment of the mA and/or kV according to patient size and/or use of iterative reconstruction technique. CONTRAST:  80mL OMNIPAQUE  IOHEXOL  300 MG/ML  SOLN COMPARISON:  None Available. FINDINGS: Lower chest: Small bilateral pleural effusions. Compressive atelectasis in the lower lobes. Hepatobiliary: No focal hepatic abnormality. Gallbladder unremarkable. Pancreas: No focal abnormality or ductal  dilatation. Spleen: No focal abnormality.  Normal size. Adrenals/Urinary Tract: Adrenal glands normal. Punctate left lower pole nephrolithiasis. No hydronephrosis. Benign appearing right lower pole cyst measures 6 cm. No follow-up imaging recommended. Stomach/Bowel: Postsurgical changes in the rectum. Again noted are probable frank dehiscence changes. Air again noted around the rectum in the perirectal space. Multiple small fluid collections and air collections in the abdomen and pelvis. The largest is noted anteriorly measuring 4 x 2.7 cm on image 47. This is just deep to and superior to and indwelling pigtail drainage catheter. Multiple other smaller fluid collections noted along the descending colon and sigmoid colon in the left lower quadrant and along small bowel loops in the right lower quadrant. No bowel obstruction. Vascular/Lymphatic: Aortic atherosclerosis. No evidence of aneurysm or adenopathy. IVC filter in place. Reproductive: Prior hysterectomy.  No adnexal masses. Other:  Trace free fluid in the pelvis. A few locules of extraluminal gas scattered throughout the abdomen and pelvis within the fluid collections described above. Musculoskeletal: No acute bony abnormality. IMPRESSION: Similar appearance at the rectal anastomosis concerning for dehiscence. Interval placement of pigtail drainage catheter anteriorly in the pelvis. There are several small fluid collections throughout the abdomen and pelvis with largest anteriorly in the midline of the pelvis measuring 4 x 2.7 cm. Small bilateral pleural effusions. Compressive atelectasis in the lower lobes. Aortic atherosclerosis. Electronically Signed   By: Janeece Mechanic M.D.   On: 05/26/2024 14:44      Medications / Allergies: per chart  Antibiotics: Anti-infectives (From admission, onward)    Start     Dose/Rate Route Frequency Ordered Stop   05/27/24 0800  vancomycin  (VANCOCIN ) IVPB 1000 mg/200 mL premix        1,000 mg 200 mL/hr over 60 Minutes Intravenous Every 24 hours 05/26/24 1144     05/23/24 1400  piperacillin -tazobactam (ZOSYN ) IVPB 3.375 g        3.375 g 12.5 mL/hr over 240 Minutes Intravenous Every 8 hours 05/23/24 0804     05/23/24 1200  micafungin (MYCAMINE) 100 mg in sodium chloride  0.9 % 100 mL IVPB        100 mg 105 mL/hr over 1 Hours Intravenous Every 24 hours 05/23/24 0755 06/06/24 1159   05/22/24 0800  vancomycin  (VANCOCIN ) IVPB 1000 mg/200 mL premix  Status:  Discontinued        1,000 mg 200 mL/hr over 60 Minutes Intravenous Every 24 hours 05/21/24 0634 05/26/24 0806   05/21/24 0730  vancomycin  (VANCOREADY) IVPB 1250 mg/250 mL        1,250 mg 166.7 mL/hr over 90 Minutes Intravenous  Once 05/21/24 0634 05/21/24 1043   05/19/24 1400  piperacillin -tazobactam (ZOSYN ) IVPB 3.375 g  Status:  Discontinued        3.375 g 12.5 mL/hr over 240 Minutes Intravenous Every 8 hours 05/19/24 1303 05/23/24 0804   05/09/24 0900  erythromycin  250 mg in sodium chloride  0.9 % 100 mL IVPB        250 mg 100 mL/hr  over 60 Minutes Intravenous Every 8 hours 05/09/24 0732 05/11/24 0044   05/08/24 2200  cefoTEtan  (CEFOTAN ) 2 g in sodium chloride  0.9 % 100 mL IVPB        2 g 200 mL/hr over 30 Minutes Intravenous Every 12 hours 05/08/24 1759 05/09/24 0749   05/08/24 1400  neomycin  (MYCIFRADIN ) tablet 1,000 mg  Status:  Discontinued       Placed in And Linked Group   1,000 mg Oral 3 times per day 05/08/24  1119 05/08/24 1120   05/08/24 1400  metroNIDAZOLE  (FLAGYL ) tablet 1,000 mg  Status:  Discontinued       Placed in And Linked Group   1,000 mg Oral 3 times per day 05/08/24 1119 05/08/24 1120   05/08/24 1130  cefoTEtan  (CEFOTAN ) 2 g in sodium chloride  0.9 % 100 mL IVPB        2 g 200 mL/hr over 30 Minutes Intravenous On call to O.R. 05/08/24 1119 05/09/24 0749         Note: Portions of this report may have been transcribed using voice recognition software. Every effort was made to ensure accuracy; however, inadvertent computerized transcription errors may be present.   Any transcriptional errors that result from this process are unintentional.    Eddye Goodie, MD, FACS, MASCRS Esophageal, Gastrointestinal & Colorectal Surgery Robotic and Minimally Invasive Surgery  [facility] [address], [address] [place], [place] [address] [phone] Fax [phone] Main  CONTACT INFORMATION: Weekday (9AM-5PM): Call [facility] main office at [phone] Weeknight (5PM-9AM) or Weekend/Holiday: Check EPIC Web Links tab & use AMION (password  [email or web address]) for General Surgery [facility] coverage  Please, DO NOT use SecureChat  (it is not reliable communication to reach operating surgeons & will lead to a delay in care).   Epic staff messaging available for outptient concerns needing 1-2 business day response.      05/27/2024  7:41 AM

## 2024-05-27 NOTE — Progress Notes (Signed)
 Occupational Therapy Treatment Patient Details Name: Elizabeth Odom MRN: [id number] DOB: [birth year] Today's Date: 05/27/2024   History of present illness 83 y.o. female s/p rectosigmoid resection takedown on 5/29. Course complicated by A-fib with RVR and then hypotension/rectal bleeding and syncope on 05/11/2024.  Patient  had postop ileus needing NG tube and TPN.  Imaging on 05/19/2024 showed possible abscesses. S/P IR guided drains placed on 05/20/2024.  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   OT comments  Patient was limited during session today with onset of very loose BM with attempted standing. Patient will benefit from continued inpatient follow up therapy, <3 hours/day. Patient's discharge plan remains appropriate at this time. OT will continue to follow acutely.        If plan is discharge home, recommend the following:  A lot of help with bathing/dressing/bathroom;Assistance with cooking/housework;Direct supervision/assist for medications management;Assist for transportation;Direct supervision/assist for financial management;Help with stairs or ramp for entrance;Two people to help with walking and/or transfers   Equipment Recommendations  None recommended by OT       Precautions / Restrictions Precautions Precautions: Fall Recall of Precautions/Restrictions: Impaired Precaution/Restrictions Comments: ABD surgery,  abdominal JP Restrictions Weight Bearing Restrictions Per Provider Order: No       Mobility Bed Mobility Overal bed mobility: Needs Assistance Bed Mobility: Rolling, Sidelying to Sit, Sit to Supine Rolling: Max assist, Mod assist Sidelying to sit: Mod assist   Sit to supine: Mod assist   General bed mobility comments: initial roll to left  with mod support and cues for position, mofd support to sit upright, mod support to return to side.  max  assist to roll when changing bed pads      Balance   Sitting-balance support: Bilateral upper extremity supported, Feet supported Sitting balance-Leahy Scale: Fair     Standing balance support: Bilateral upper extremity supported, During functional activity, Reliant on assistive device for balance Standing balance-Leahy Scale: Poor       ADL either performed or assessed with clinical judgement   ADL Overall ADL's : Needs assistance/impaired                         Toilet Transfer: +2 for safety/equipment;Minimal assistance;+2 for physical assistance;Rolling walker (2 wheels)   Toileting- Clothing Manipulation and Hygiene: Sit to/from stand;Total assistance Toileting - Clothing Manipulation Details (indicate cue type and reason): patient stood to get to take steps with RW. patient startd to have liquid bm. returned to bed.             Communication Communication Communication: No apparent difficulties   Cognition Arousal: Alert Behavior During Therapy: Flat affect Cognition: Cognition impaired             OT - Cognition Comments: patient having poor awareness of needs past pain in bottom and abdomen. not receptive to education to improve positioning                   Following commands impaired: Follows one step commands with increased time                    Pertinent Vitals/ Pain       Pain Assessment Pain Assessment: Faces Faces Pain Scale: Hurts even more Pain Location: bottom and abdomen  with repositioning and  hygiene Pain Descriptors / Indicators: Grimacing, Constant, Discomfort Pain Intervention(s): Limited activity within patient's tolerance, Monitored during session, Repositioned         Frequency  Min 2X/week        Progress Toward Goals  OT Goals(current goals can now be found in the care plan section)  Progress  towards OT goals: Progressing toward goals     Plan      Co-evaluation      Reason for Co-Treatment: Complexity of the patient's impairments (multi-system involvement);For patient/therapist safety;To address functional/ADL transfers PT goals addressed during session: Mobility/safety with mobility OT goals addressed during session: ADL's and self-care;Strengthening/ROM;Proper use of Adaptive equipment and DME      AM-PAC OT 6 Clicks Daily Activity     Outcome Measure   Help from another person eating meals?: Total (NPO) Help from another person taking care of personal grooming?: A Little Help from another person toileting, which includes using toliet, bedpan, or urinal?: Total Help from another person bathing (including washing, rinsing, drying)?: Total Help from another person to put on and taking off regular upper body clothing?: A Lot Help from another person to put on and taking off regular lower body clothing?: Total 6 Click Score: 9    End of Session Equipment Utilized During Treatment: Rolling walker (2 wheels)  OT Visit Diagnosis: Unsteadiness on feet (R26.81);Pain;Muscle weakness (generalized) (M62.81)   Activity Tolerance Patient limited by pain   Patient Left with call bell/phone within reach;in bed;with bed alarm set   Nurse Communication Other (comment) (in room at end of session)        Time: 1610-9604 OT Time Calculation (min): 18 min  Charges: OT General Charges $OT Visit: 1 Visit  Wynette Heckler, MS Acute Rehabilitation Department Office# [phone]   Jame Maze 05/27/2024, 1:01 PM

## 2024-05-28 ENCOUNTER — Ambulatory Visit: Admitting: Student

## 2024-05-28 DIAGNOSIS — B965 Pseudomonas (aeruginosa) (mallei) (pseudomallei) as the cause of diseases classified elsewhere: Secondary | ICD-10-CM

## 2024-05-28 DIAGNOSIS — B9562 Methicillin resistant Staphylococcus aureus infection as the cause of diseases classified elsewhere: Secondary | ICD-10-CM | POA: Diagnosis not present

## 2024-05-28 DIAGNOSIS — B379 Candidiasis, unspecified: Secondary | ICD-10-CM

## 2024-05-28 DIAGNOSIS — K651 Peritoneal abscess: Secondary | ICD-10-CM | POA: Diagnosis not present

## 2024-05-28 DIAGNOSIS — K5790 Diverticulosis of intestine, part unspecified, without perforation or abscess without bleeding: Secondary | ICD-10-CM | POA: Diagnosis not present

## 2024-05-28 DIAGNOSIS — B952 Enterococcus as the cause of diseases classified elsewhere: Secondary | ICD-10-CM

## 2024-05-28 LAB — VANCOMYCIN, PEAK: Vancomycin Pk: 23 ug/mL — ABNORMAL LOW (ref 30–40)

## 2024-05-28 LAB — BASIC METABOLIC PANEL WITH GFR
Anion gap: 9 (ref 5–15)
BUN: 19 mg/dL (ref 8–23)
CO2: 23 mmol/L (ref 22–32)
Calcium: 7.9 mg/dL — ABNORMAL LOW (ref 8.9–10.3)
Chloride: 101 mmol/L (ref 98–111)
Creatinine, Ser: 0.51 mg/dL (ref 0.44–1.00)
GFR, Estimated: >60 mL/min (ref >60)
Glucose, Bld: 125 mg/dL — ABNORMAL HIGH (ref 70–99)
Potassium: 4 mmol/L (ref 3.5–5.1)
Sodium: 133 mmol/L — ABNORMAL LOW (ref 135–145)

## 2024-05-28 LAB — VANCOMYCIN, TROUGH: Vancomycin Tr: 5 ug/mL — ABNORMAL LOW (ref 15–20)

## 2024-05-28 LAB — MAGNESIUM: Magnesium: 1.9 mg/dL (ref 1.7–2.4)

## 2024-05-28 LAB — PHOSPHORUS: Phosphorus: 3.3 mg/dL (ref 2.5–4.6)

## 2024-05-28 MED ORDER — LOPERAMIDE HCL 2 MG PO CAPS
4.0000 mg | ORAL_CAPSULE | Freq: Three times a day (TID) | ORAL | Status: DC
  Administered 2024-05-28 (×3): 4 mg via ORAL
  Filled 2024-05-28 (×4): qty 2

## 2024-05-28 MED ORDER — VANCOMYCIN HCL 750 MG/150ML IV SOLN
750.0000 mg | Freq: Two times a day (BID) | INTRAVENOUS | Status: DC
  Administered 2024-05-28 – 2024-05-29 (×2): 750 mg via INTRAVENOUS
  Filled 2024-05-28 (×2): qty 150

## 2024-05-28 MED ORDER — TRAVASOL 10 % IV SOLN
INTRAVENOUS | Status: AC
  Filled 2024-05-28: qty 537.6

## 2024-05-28 NOTE — Plan of Care (Signed)

## 2024-05-28 NOTE — Progress Notes (Addendum)
 PHARMACY - TOTAL PARENTERAL NUTRITION CONSULT NOTE   Indication: Prolonged ileus  Patient Measurements: Height: 5' 5 (165.1 cm) Weight: 60.9 kg (134 lb 4.2 oz) IBW/kg (Calculated) : 57 TPN AdjBW (KG): 61.3 Body mass index is 22.34 kg/m.  Assessment: 51 yoF with history of diverticulitis with perforation and abscess. 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 She requested ostomy takedown and underwent LOA, colostomy takedown, small bowel resection with anastomosis on 5/29. Pharmacy is consulted to dose TPN starting 6/2 for postop ileus.  Now with anastomotic leak complicated by multiple-intraabdominal abscesses.  Glucose / Insulin : no Hx DM, A1c 5.1%. BG goal 100-180. - CBG/SSI discontinued on 6/11. BG 125 this morning. Electrolytes: Na (133) slightly low on max Na concentration -All others WNL, including CorrCa (9.9) Hepatic: LFTs, Tbili, TG WNL.  -Albumin  low. Alk Phos trending down I/O: No mIVF. -PO intake: 660 mL  -UOP: x7, stool x8, drain: 175 mL GI Imaging:  - 6/9 CT: [name] dehiscence along the rectal anastomosis, free air and multiple fluid collections GI Surgeries / Procedures: - 5/29: LAR, SBR, colostomy takedown, LOA - 6/10 drains placed x 2 in IR (midline abd, transgluteal) - 6/17: Replacement of drainage catheter by IR  Central access: Double lumen PICC placed 6/2 TPN start date: 6/2  Nutritional Goals: - Goal TPN rate is 70 mL/hr (provides 94 g of protein and 1680 kcals per day)  RD Assessment:  Estimated Needs Total Energy Estimated Needs: 1600-1750 kcals Total Protein Estimated Needs: 80-95 grams Total Fluid Estimated Needs: >/= 1.6L  Current Nutrition:  - TPN (weaning) - Diet: Regular  Calorie count completed from 6/13 > 6/16. Not meeting needs. -Pt continues to refuse all Boost supplementation, Magic cup.  Plan:  Received orders  from [facility] to gradually wean off of TPN.  At 18:00 Decrease TPN to 40 mL/hr. Plan to stop TPN on 6/19. Will provide 54 g protein, 960 kcal Electrolytes in TPN: No change Na 150 mEq/L (max) K 50 mEq/L Ca 5 mEq/L Mg 5 mEq/L Phos 15 mmol/L Cl:Ac 1:1 No CBG checks/SSI  MVI PO Monitor TPN labs on Mon/Thurs.   Shireen Dory, PharmD 05/28/24 9:38 AM

## 2024-05-28 NOTE — Progress Notes (Signed)
 PROGRESS NOTE    Elizabeth Odom  ZOX:[id number] DOB: [birth year] DOA: 05/08/2024 PCP: Anthon Kins, MD   Brief Narrative:    Assessment and Plan:  Persistent atrial fibrillation/flutter Cardiology consulted for management.  Medication adjusted to metoprolol  XL 25 mg daily with recommendation for Eliquis  when okay per surgery.  Eliquis  still held at this time.  Toprol -XL decreased to 12.5 mg daily -Continue Toprol -XL 12.5 mg daily  History of diverticulitis with perforation and abscess with colostomy status post ostomy takedown Ileus Per primary.  Acute blood loss anemia Secondary to surgery.  Hemoglobin down to a low of 6.1 this admission.  Patient received a total of 4 units of PRBC via transfusion this admission.  Hemoglobin currently stable.  AKI Mild. Resolved.  Hypoalbuminemia Noted. Associated poor nutrition.  Leukocytosis In setting of surgery. Resolved.  Thrombocytosis Noted. Likely reactive. Mildly elevated and stable.  Hyponatremia Mild.  Syncope Syncopal episode occurred during hospitalization while patient was on commode.  Concern for possible vasovagal versus hypotension etiology.  Transthoracic echo obtained with evidence of preserved LVEF of 60 to 65% in addition to no aortic stenosis.  Acute urinary retention Foley inserted on 6/4. Flomax  started. Foley removed on 6/13. -Continue Flomax   Acute metabolic encephalopathy Delirium Resolved.   DVT prophylaxis: Per primary Code Status:   Code Status: Full Code Family Communication: None at bedside Disposition Plan: Per primary   Consultants:    Procedures:    Antimicrobials:     Subjective: Patient without specific concerns. She thinks she is better than she expected, but not where she wants to be.  Objective: BP 122/69 (BP Location: Left Arm)   Pulse 93   Temp 98.5 F (36.9 C) (Oral)   Resp 18   Ht 5' 5 (1.651 m)   Wt 60.9 kg   SpO2 95%   BMI 22.34 kg/m    Examination:  General exam: Appears calm and comfortable Respiratory system: Clear to auscultation. Respiratory effort normal. Cardiovascular system: S1 & S2 heard, RRR. Gastrointestinal system: Abdomen is nondistended, soft and nontender. Normal bowel sounds heard. Central nervous system: Alert and oriented. No focal neurological deficits. Psychiatry: Judgement and insight appear normal. Mood & affect appropriate.    Data Reviewed: I have personally reviewed following labs and imaging studies  CBC Lab Results  Component Value Date   WBC 8.3 05/26/2024   RBC 3.08 (L) 05/26/2024   HGB 8.7 (L) 05/26/2024   HCT 28.4 (L) 05/26/2024   MCV 92.2 05/26/2024   MCH 28.2 05/26/2024   PLT 628 (H) 05/26/2024   MCHC 30.6 05/26/2024   RDW 16.0 (H) 05/26/2024   LYMPHSABS 0.7 05/09/2024   MONOABS 1.8 (H) 05/09/2024   EOSABS 0.0 05/09/2024   BASOSABS 0.0 05/09/2024     Last metabolic panel Lab Results  Component Value Date   NA 133 (L) 05/28/2024   K 4.0 05/28/2024   CL 101 05/28/2024   CO2 23 05/28/2024   BUN 19 05/28/2024   CREATININE 0.51 05/28/2024   GLUCOSE 125 (H) 05/28/2024   GFRNONAA >60 05/28/2024   GFRAA >60 04/10/2016   CALCIUM  7.9 (L) 05/28/2024   PHOS 3.3 05/28/2024   PROT 6.0 (L) 05/26/2024   ALBUMIN  <1.5 (L) 05/26/2024   LABGLOB 2.9 04/08/2024   BILITOT 0.7 05/26/2024   ALKPHOS 212 (H) 05/26/2024   AST 31 05/26/2024   ALT 37 05/26/2024   ANIONGAP 9 05/28/2024    GFR: Estimated Creatinine Clearance: 47.9 mL/min (by C-G formula based on SCr  of 0.51 mg/dL).  Recent Results (from the past 240 hours)  Aerobic/Anaerobic Culture w Gram Stain (surgical/deep wound)     Status: None (Preliminary result)   Collection Time: 05/22/24  3:59 PM   Specimen: Abdomen  Result Value Ref Range Status   Specimen Description   Final    ABDOMEN Performed at [facility], [address], [place], [place] [address]    Special Requests   Final    ABDOMINAL  DRAIN Performed at [facility], [address] [place], [place] [address]    Gram Stain   Final    ABUNDANT WBC PRESENT, PREDOMINANTLY PMN RARE GRAM POSITIVE COCCI RARE YEAST WITH PSEUDOHYPHAE    Culture   Final    MODERATE PSEUDOMONAS AERUGINOSA MODERATE STAPHYLOCOCCUS AUREUS SUSCEPTIBILITIES PERFORMED ON PREVIOUS CULTURE WITHIN THE LAST 5 DAYS. MODERATE CANDIDA ALBICANS Sent to [facility] for further susceptibility testing. NO ANAEROBES ISOLATED Performed at [facility], [address], [place], [place] [address]    Report Status PENDING  Incomplete   Organism ID, Bacteria PSEUDOMONAS AERUGINOSA  Final      Susceptibility   Pseudomonas aeruginosa - MIC*    CEFTAZIDIME 4 SENSITIVE Sensitive     CIPROFLOXACIN <=0.25 SENSITIVE Sensitive     GENTAMICIN 4 SENSITIVE Sensitive     IMIPENEM 2 SENSITIVE Sensitive     PIP/TAZO <=4 SENSITIVE Sensitive ug/mL    CEFEPIME 2 SENSITIVE Sensitive     * MODERATE PSEUDOMONAS AERUGINOSA  Aerobic/Anaerobic Culture w Gram Stain (surgical/deep wound)     Status: None   Collection Time: 05/22/24  3:59 PM   Specimen: Back  Result Value Ref Range Status   Specimen Description   Final    BACK Performed at [facility], [address] [place], [place] [address]    Special Requests   Final    TRANSGLUTEAL PERCUTANEOUS IR DRAIN Performed at [facility], [address] [place], [place] [address]    Gram Stain   Final    NO WBC SEEN ABUNDANT GRAM NEGATIVE RODS RARE GRAM POSITIVE COCCI IN PAIRS RARE BUDDING YEAST SEEN    Culture   Final    ABUNDANT PSEUDOMONAS AERUGINOSA ABUNDANT METHICILLIN RESISTANT STAPHYLOCOCCUS AUREUS ABUNDANT ENTEROCOCCUS FAECALIS ABUNDANT CANDIDA ALBICANS NO ANAEROBES ISOLATED Performed at [facility], [address] [place], [place] [address]    Report Status 05/27/2024 FINAL  Final   Organism ID, Bacteria PSEUDOMONAS AERUGINOSA  Final    Organism ID, Bacteria METHICILLIN RESISTANT STAPHYLOCOCCUS AUREUS  Final   Organism ID, Bacteria ENTEROCOCCUS FAECALIS  Final      Susceptibility   Enterococcus faecalis - MIC*    AMPICILLIN <=2 SENSITIVE Sensitive     VANCOMYCIN  1 SENSITIVE Sensitive     GENTAMICIN SYNERGY SENSITIVE Sensitive     * ABUNDANT ENTEROCOCCUS FAECALIS   Methicillin resistant staphylococcus aureus - MIC*    CIPROFLOXACIN >=8 RESISTANT Resistant     ERYTHROMYCIN  >=8 RESISTANT Resistant     GENTAMICIN <=0.5 SENSITIVE Sensitive     OXACILLIN >=4 RESISTANT Resistant     TETRACYCLINE <=1 SENSITIVE Sensitive     VANCOMYCIN  1 SENSITIVE Sensitive     TRIMETH/SULFA >=320 RESISTANT Resistant     CLINDAMYCIN <=0.25 SENSITIVE Sensitive     RIFAMPIN <=0.5 SENSITIVE Sensitive     Inducible Clindamycin NEGATIVE Sensitive     LINEZOLID 2 SENSITIVE Sensitive     * ABUNDANT METHICILLIN RESISTANT STAPHYLOCOCCUS AUREUS   Pseudomonas aeruginosa -  MIC*    CEFTAZIDIME 4 SENSITIVE Sensitive     CIPROFLOXACIN <=0.25 SENSITIVE Sensitive     GENTAMICIN 4 SENSITIVE Sensitive     IMIPENEM 2 SENSITIVE Sensitive     PIP/TAZO <=4 SENSITIVE Sensitive ug/mL    CEFEPIME 2 SENSITIVE Sensitive     * ABUNDANT PSEUDOMONAS AERUGINOSA  Aerobic/Anaerobic Culture w Gram Stain (surgical/deep wound)     Status: None (Preliminary result)   Collection Time: 05/27/24  5:19 PM   Specimen: Abscess  Result Value Ref Range Status   Specimen Description   Final    ABSCESS ABDOMEN Performed at [facility], [address] [place], [place] [address]    Special Requests   Final    NONE Performed at [facility], [address] [place], [place] [address]    Gram Stain   Final    ABUNDANT WBC PRESENT, PREDOMINANTLY PMN NO ORGANISMS SEEN Performed at [facility], [address] [place], [place] [address]    Culture PENDING  Incomplete   Report Status PENDING  Incomplete      Radiology Studies: CT  IMAGE GUIDE DRAIN TRANSVAG TRANSRECT PERITONEAL RETROPER Result Date: 05/27/2024 Rosetta Cons, MD     05/27/2024  5:19 PM Pre procedural Dx: Mult abscesses Post procedural Dx: Same Technically successful CT guided placed of a 10 Fr drainage catheter placement into the abdominal and pelvic abscesses yielding pus and stool.  A representative aspirated sample was capped and sent to the laboratory for analysis.  EBL: Trace Complications: None immediate Zettie Hillock, MD Pager #: [phone]   CT GUIDED PERITONEAL/RETROPERITONEAL FLUID DRAIN BY [facility] CATH Result Date: 05/27/2024 Rosetta Cons, MD     05/27/2024  5:19 PM Pre procedural Dx: Mult abscesses Post procedural Dx: Same Technically successful CT guided placed of a 10 Fr drainage catheter placement into the abdominal and pelvic abscesses yielding pus and stool.  A representative aspirated sample was capped and sent to the laboratory for analysis.  EBL: Trace Complications: None immediate Zettie Hillock, MD Pager #: [phone]   CT ABDOMEN PELVIS W CONTRAST Result Date: 05/26/2024 CLINICAL DATA:  Abdominal pain, postop EXAM: CT ABDOMEN AND PELVIS WITH CONTRAST TECHNIQUE: Multidetector CT imaging of the abdomen and pelvis was performed using the standard protocol following bolus administration of intravenous contrast. RADIATION DOSE REDUCTION: This exam was performed according to the departmental dose-optimization program which includes automated exposure control, adjustment of the mA and/or kV according to patient size and/or use of iterative reconstruction technique. CONTRAST:  80mL OMNIPAQUE  IOHEXOL  300 MG/ML  SOLN COMPARISON:  None Available. FINDINGS: Lower chest: Small bilateral pleural effusions. Compressive atelectasis in the lower lobes. Hepatobiliary: No focal hepatic abnormality. Gallbladder unremarkable. Pancreas: No focal abnormality or ductal dilatation. Spleen: No focal abnormality.  Normal size. Adrenals/Urinary Tract: Adrenal glands normal.  Punctate left lower pole nephrolithiasis. No hydronephrosis. Benign appearing right lower pole cyst measures 6 cm. No follow-up imaging recommended. Stomach/Bowel: Postsurgical changes in the rectum. Again noted are probable frank dehiscence changes. Air again noted around the rectum in the perirectal space. Multiple small fluid collections and air collections in the abdomen and pelvis. The largest is noted anteriorly measuring 4 x 2.7 cm on image 47. This is just deep to and superior to and indwelling pigtail drainage catheter. Multiple other smaller fluid collections noted along the descending colon and sigmoid colon in the left lower quadrant and along small bowel loops in the right lower quadrant. No bowel obstruction. Vascular/Lymphatic:  Aortic atherosclerosis. No evidence of aneurysm or adenopathy. IVC filter in place. Reproductive: Prior hysterectomy.  No adnexal masses. Other: Trace free fluid in the pelvis. A few locules of extraluminal gas scattered throughout the abdomen and pelvis within the fluid collections described above. Musculoskeletal: No acute bony abnormality. IMPRESSION: Similar appearance at the rectal anastomosis concerning for dehiscence. Interval placement of pigtail drainage catheter anteriorly in the pelvis. There are several small fluid collections throughout the abdomen and pelvis with largest anteriorly in the midline of the pelvis measuring 4 x 2.7 cm. Small bilateral pleural effusions. Compressive atelectasis in the lower lobes. Aortic atherosclerosis. Electronically Signed   By: Janeece Mechanic M.D.   On: 05/26/2024 14:44      LOS: 20 days    Aneita Keens, MD [facility] 05/28/2024, 7:56 AM   If 7PM-7AM, please contact night-coverage [email or web address]

## 2024-05-28 NOTE — Plan of Care (Signed)
  Problem: Education: Goal: Verbalization of understanding of the causes of altered bowel function will improve Outcome: Progressing   Problem: Bowel/Gastric: Goal: Gastrointestinal status for postoperative course will improve Outcome: Progressing   Problem: Health Behavior/Discharge Planning: Goal: Identification of community resources to assist with postoperative recovery needs will improve Outcome: Progressing   Problem: Nutritional: Goal: Will attain and maintain optimal nutritional status will improve Outcome: Progressing

## 2024-05-28 NOTE — Progress Notes (Signed)
 Pharmacy Antibiotic Note   CC/HPI: Elizabeth Odom is a 83 yo F presenting with anastomotic leak complicated by multiple-intraabdominal abscesses. Pharmacy has been consulted on 05/28/24 to dose vancomycin  for abdominal abscess.   Assessment: WBC WNL (8.3), remains afebrile (last fever 6/16 at 100.4 F), SCr stable at 0.51 mg/dL for eCrCl 16.1 mL/min (by C-G formula). Zosyn  D#10, vancomycin  D#8, micafungin D#6. Repeat abdominal cultures pending.  Vanc levels today: peak 23 mcg/mL and trough 5 mcg/mL for AUC 293   Plan: - Adjust vancomycin  to 750 mg IV q12h for calculated AUC 439 - Continue Zosyn  3.375 gm IV q8h (infuse over 4 hrs) - Monitor SCr closely while on vancomycin  and zosyn   - Continue micafungin 100 mg IV q24h   _________________________________________   Height: 5' 5 (165.1 cm) Weight: 60.9 kg (134 lb 4.2 oz) IBW/kg (Calculated) : 57   Allergies      Allergies  Allergen Reactions   Elemental Sulfur Anaphylaxis          Thank you for allowing pharmacy to be a part of this patient's care.   Sissy Duff, Arvin Bishop PharmD Candidate 05/28/2024 14:15 PM

## 2024-05-28 NOTE — Progress Notes (Signed)
 05/28/2024  Elizabeth Odom [id number] 10-Jul-1941  CARE TEAM: PCP: Anthon Kins, MD  Outpatient Care Team: Patient Care Team: Anthon Kins, MD as PCP - General (Internal Medicine) Cody Das, MD as PCP - Cardiology (Cardiology) Lajuan Pila, MD as Consulting Physician (Gastroenterology) Shela Derby, MD as Consulting Physician (General Surgery) Glory Larsen, MD (Neurology) Lahoma Pigg, MD as Consulting Physician (Urology) Sandee Crook Margart Shears, MD as Consulting Physician (Cardiology) Candyce Champagne, MD as Consulting Physician (Colon and Rectal Surgery)  Inpatient Treatment Team: Treatment Team:  Candyce Champagne, MD Ruel Cotta, Heinz Llano, MD Ozell Blunt, MD Vladimir Groves Radiology, MD Lexine Redder, MD Audria Leather, MD Allred, Boyd Cabal, PA-C Mugweru, Sam Creighton, MD Rosetta Cons, MD Consuela Denier, RN Verlyn Goad, MD Ardena Becker, LPN   Problem List:   Principal Problem:   Diverticular disease Active Problems:   A-fib [facility])   Restless leg   ABLA (acute blood loss anemia)   Need for emotional support   Acute urinary retention   Mixed hyperlipidemia   Essential hypertension   Diverticular disease of left colon   History of DVT (deep vein thrombosis)   History of pulmonary embolus (PE)   Pre-diabetes   Presence of IVC filter   Stricture of sigmoid colon (HCC)   Protein-calorie malnutrition, severe   Hypokalemia   05/08/2024  POST-OPERATIVE DIAGNOSIS:   COLOSTOMY FOR RESECTION, DESIRE FOR OSTOMY TAKEDOWN RECTAL STRICTURE HISTORY OF DIVERTICULITIS WITH PERFORATION & ABSCESS   PROCEDURE:   -ROBOTIC RECTOSIGMOID RESECTION (LAR) -TAKEDOWN OF END COLOSTOMY WITH ANASTOMOSIS -RESECTION OF SMALL INTESTINE WITH ANASTOMOSIS -SMALL BOWEL REPAIR -LYSIS OF ADHESIONS x 115 MINUTES (66% OF CASE),  -INTRAOPERATIVE ASSESSMENT OF TISSUE VASCULAR PERFUSION USING ICG (indocyanine green ) IMMUNOFLUORESCENCE,  -TRANSVERSUS  ABDOMINIS PLANE (TAP) BLOCK - BILATERAL -FLEXIBLE SIGMOIDOSCOPY   SURGEON:  Eddye Goodie, MD  OR FINDINGS:  Very dense adhesions of small bowel especially to pelvis and retroperitoneum.  Extremely concrete dense adhesions with friable tissue near her old prior small bowel anastomosis and distal jejunum.  Required small bowel repair and required small bowel resection including old anastomosis given severe tissues.  Patient had significant fibrotic stricturing of rectal stump at the mid/distal rectal junction.  Could not be released up.  Ended up doing resection of rectal stump to mid/distal rectal junction.  No obvious metastatic disease on visceral parietal peritoneum or liver.   It is a 29mm EEA anastomosis ( distal descending colon  connected to mid/distal rectal junction.)  It rests 7 cm from the anal verge by rigid proctoscopy.  05/20/2024  Post procedural Dx: Post op abscess, multiple   Technically successful CT guided placed of a  10 Fr drainage catheter placement x 2  into the midline ventral and dorsal perirectal abscesses.     Rosetta Cons, MD    Assessment [facility] Stay = 20 days) 20 Days Post-Op    Ileus resolving   Fluid collections raises suspicion of contained leak s/p drainage 6/10 & redo 6/17    Plan:  Internal drainage  Perc drain in abdomen collapsed - redirected to last major abscess - decompressed  New transgluteal drain replaced - need to keep near contained leak  Antidiarrheal regimen -  Psyllium and loperamide .  Having loose BMs again after tapering - go back up on loperamide .  Lomotil for breakthrough   Growing numerous organisms including Pseudomonas, MRSA and Candida albicans.    Vancomycin , piperacillin /tazobactam and micafungin.    Would be helpful  to get sensitivities on C. albicans to make sure it is not resistant.  Anticipate 14-day course of antibiotics for now given her malnourished and elderly state and significant pelvic  contamination.  Will ask ID to help with outpatient   Pain control without over-sedating her.  Tramadol , Flexeril, and gabapentin  since the opioids make her rather sedated/groggy.  That seems to be working  TPN for severe protein malnutrition.  Prealbumin starting to climb in more normal range which is a guardedly hopeful sign.  Eating more but very picky eater.  Try to wean TPN off.   History of urinary retention Foley catheter out with no problems with urination now that pelvic collection under better control.    Continue tamsulosin    if fails then replace and leave for several weeks until can follow-up with urology as an outpatient.  Otherwise try and get out so she does not have a risk of UTIs   No rectal tubes.  Atrial fibrillation postop.  History of a prior event.    Cardiology signed off for now.  Reconsult as needed.  Stop telemetry for now  Restarted DOAC Eliquis  6/12 after prior hematochezia.  No strong evidence of that.  Follow hemoglobin.  Holding Eliquis  6/15 in anticipation of drain replacement 6/17, and restart 6/18  Rate controlled switch from amlodipine  to metoprolol .  Less tachycardic.  Blood pressure soft.  Back down 12.5 mg of metoprolol  and follow with PRN (as needed) breakthrough  -monitor electrolytes & replace as needed.  Keep K>4, Mg>2, Phos>3  -VTE prophylaxis- SCDs.  Anticoagulation as appropriate  -mobilize as tolerated to help recovery.  Enlist therapies in moderate/high risk patients as appropriate.  Patient is refusing skilled facility or any rehab but is not getting up much.  Try to have strategies to help her get up and mobilize and actually work with physical therapy, or she is going to lose her argument.    I updated the patient's status to the patient and nurse.  Recommendations were made.  Questions were answered.  They expressed understanding & appreciation.  -Disposition: TBD - I think she needs SNF.  She has been very resistant, but she may  reconsider      I reviewed last 24 h vitals and pain scores, last 48 h intake and output, last 24 h labs and trends, and last 24 h imaging results.  I have reviewed this patient's available data, including medical history, events of note, test results, etc as part of my evaluation.   A significant portion of that time was spent in counseling. Care during the described time interval was provided by me.  This care required moderate level of medical decision making.  05/28/2024    Subjective: (Chief complaint)  Drain replace in pelvis Abd drain repositioned Pt tired/sleepy Denies abd pain  Objective:  Vital signs:  Vitals:   05/27/24 1715 05/27/24 2143 05/28/24 0104 05/28/24 0444  BP: 131/70 (!) 117/58 130/76 122/69  Pulse: (!) 104 92 (!) 101 93  Resp: 19 18 18 18   Temp:  98.6 F (37 C) 98.3 F (36.8 C) 98.5 F (36.9 C)  TempSrc:  Oral Oral Oral  SpO2: 98% 97% 96% 95%  Weight:      Height:        Last BM Date : 05/27/24  Intake/Output   Yesterday:  06/17 0701 - 06/18 0700 In: 2595.3 [P.O.:660; I.V.:1492.6; IV Piggyback:442.7] Out: 175 [Drains:175] This shift:  No intake/output data recorded.  Bowel function:  Flatus: YES  BM:  YES - loose watery  Drain:  Right lower quadrant drain: Scant purulent Transgluteal light brown/blood  Physical Exam:  General: Sleepy but answering questions.       Eyes: PERRL, normal EOM.  Sclera clear.  No icterus Neuro: CN II-XII intact w/o focal sensory/motor deficits.  All extremities.  No facial droop. Lymph: No head/neck/groin lymphadenopathy Psych:  No delerium/psychosis/paranoia.  Oriented x 4 HENT: Normocephalic, Mucus membranes moist.  No thrush.  Mild HOH Neck: Supple, No tracheal deviation.  No obvious thyromegaly.  Prefers to sit with head flexed and chin down.  Does not want to have her neck back.  Pillow adjusted. Chest: No pain to chest wall compression.  Good respiratory excursion.  No audible wheezing CV:   Pulses intact.  Regular rhythm.  No major extremity edema MS: Normal AROM mjr joints.  No obvious deformity  Abdomen:  Soft.  Moderately distended.  Nontender.  No guarding.  No complaints of pain to percussion or benching or cough.  No evidence of peritonitis. Old ostomy incision with normal healing ridge.    GU: No Foley Rectal: deferred today  Ext:   No deformity.  No mjr edema.  No cyanosis Skin: No petechiae / purpurea.  No major sores.  Warm and dry    Results:   Cultures: Recent Results (from the past 720 hours)  Urine Culture     Status: Abnormal   Collection Time: 05/09/24  8:30 PM   Specimen: Urine, Clean Catch  Result Value Ref Range Status   Specimen Description   Final    URINE, CLEAN CATCH Performed at [facility], [address], [place], [place] [address]    Special Requests   Final    NONE Performed at [facility], [address] [place], [place] [address]    Culture (A)  Final    <10,000 COLONIES/mL INSIGNIFICANT GROWTH Performed at [facility], [address] [place], [place] [address]    Report Status 05/11/2024 FINAL  Final  Aerobic/Anaerobic Culture w Gram Stain (surgical/deep wound)     Status: None (Preliminary result)   Collection Time: 05/22/24  3:59 PM   Specimen: Abdomen  Result Value Ref Range Status   Specimen Description   Final    ABDOMEN Performed at [facility], [address] [place], [place] [address]    Special Requests   Final    ABDOMINAL DRAIN Performed at [facility], [address], [place], [place] [address]    Gram Stain   Final    ABUNDANT WBC PRESENT, PREDOMINANTLY PMN RARE GRAM POSITIVE COCCI RARE YEAST WITH PSEUDOHYPHAE    Culture   Final    MODERATE PSEUDOMONAS AERUGINOSA MODERATE STAPHYLOCOCCUS AUREUS SUSCEPTIBILITIES PERFORMED ON PREVIOUS CULTURE WITHIN THE LAST 5 DAYS. MODERATE CANDIDA ALBICANS Sent to [facility] for  further susceptibility testing. NO ANAEROBES ISOLATED Performed at [facility], [address], [place], [place] [address]    Report Status PENDING  Incomplete   Organism ID, Bacteria PSEUDOMONAS AERUGINOSA  Final      Susceptibility   Pseudomonas aeruginosa - MIC*    CEFTAZIDIME 4 SENSITIVE Sensitive     CIPROFLOXACIN <=0.25 SENSITIVE Sensitive     GENTAMICIN 4 SENSITIVE Sensitive     IMIPENEM 2 SENSITIVE Sensitive     PIP/TAZO <=4 SENSITIVE Sensitive ug/mL    CEFEPIME 2 SENSITIVE Sensitive     * MODERATE PSEUDOMONAS AERUGINOSA  Aerobic/Anaerobic Culture w Gram Stain (  surgical/deep wound)     Status: None   Collection Time: 05/22/24  3:59 PM   Specimen: Back  Result Value Ref Range Status   Specimen Description   Final    BACK Performed at [facility], [address] [place], [place] [address]    Special Requests   Final    TRANSGLUTEAL PERCUTANEOUS IR DRAIN Performed at [facility], [address], [place], [place] [address]    Gram Stain   Final    NO WBC SEEN ABUNDANT GRAM NEGATIVE RODS RARE GRAM POSITIVE COCCI IN PAIRS RARE BUDDING YEAST SEEN    Culture   Final    ABUNDANT PSEUDOMONAS AERUGINOSA ABUNDANT METHICILLIN RESISTANT STAPHYLOCOCCUS AUREUS ABUNDANT ENTEROCOCCUS FAECALIS ABUNDANT CANDIDA ALBICANS NO ANAEROBES ISOLATED Performed at [facility], [address] [place], [place] [address]    Report Status 05/27/2024 FINAL  Final   Organism ID, Bacteria PSEUDOMONAS AERUGINOSA  Final   Organism ID, Bacteria METHICILLIN RESISTANT STAPHYLOCOCCUS AUREUS  Final   Organism ID, Bacteria ENTEROCOCCUS FAECALIS  Final      Susceptibility   Enterococcus faecalis - MIC*    AMPICILLIN <=2 SENSITIVE Sensitive     VANCOMYCIN  1 SENSITIVE Sensitive     GENTAMICIN SYNERGY SENSITIVE Sensitive     * ABUNDANT ENTEROCOCCUS FAECALIS   Methicillin resistant staphylococcus aureus - MIC*    CIPROFLOXACIN >=8 RESISTANT Resistant      ERYTHROMYCIN  >=8 RESISTANT Resistant     GENTAMICIN <=0.5 SENSITIVE Sensitive     OXACILLIN >=4 RESISTANT Resistant     TETRACYCLINE <=1 SENSITIVE Sensitive     VANCOMYCIN  1 SENSITIVE Sensitive     TRIMETH/SULFA >=320 RESISTANT Resistant     CLINDAMYCIN <=0.25 SENSITIVE Sensitive     RIFAMPIN <=0.5 SENSITIVE Sensitive     Inducible Clindamycin NEGATIVE Sensitive     LINEZOLID 2 SENSITIVE Sensitive     * ABUNDANT METHICILLIN RESISTANT STAPHYLOCOCCUS AUREUS   Pseudomonas aeruginosa - MIC*    CEFTAZIDIME 4 SENSITIVE Sensitive     CIPROFLOXACIN <=0.25 SENSITIVE Sensitive     GENTAMICIN 4 SENSITIVE Sensitive     IMIPENEM 2 SENSITIVE Sensitive     PIP/TAZO <=4 SENSITIVE Sensitive ug/mL    CEFEPIME 2 SENSITIVE Sensitive     * ABUNDANT PSEUDOMONAS AERUGINOSA  Aerobic/Anaerobic Culture w Gram Stain (surgical/deep wound)     Status: None (Preliminary result)   Collection Time: 05/27/24  5:19 PM   Specimen: Abscess  Result Value Ref Range Status   Specimen Description   Final    ABSCESS ABDOMEN Performed at [facility], [address], [place], [place] [address]    Special Requests   Final    NONE Performed at [facility], [address], [place], [place] [address]    Gram Stain   Final    ABUNDANT WBC PRESENT, PREDOMINANTLY PMN NO ORGANISMS SEEN Performed at [facility], [address], [place], [place] [address]    Culture PENDING  Incomplete   Report Status PENDING  Incomplete    Labs: Results for orders placed or performed during the hospital encounter of 05/08/24 (from the past 48 hours)  Aerobic/Anaerobic Culture w Gram Stain (surgical/deep wound)     Status: None (Preliminary result)   Collection Time: 05/27/24  5:19 PM   Specimen: Abscess  Result Value Ref Range   Specimen Description      ABSCESS ABDOMEN Performed at [facility], [address] [place],  [place] [address]    Special Requests       NONE Performed at [name], [address], [place], [place] [address]    Gram Stain      ABUNDANT WBC PRESENT, PREDOMINANTLY PMN NO ORGANISMS SEEN Performed at [facility], [address] [place], [place] [address]    Culture PENDING    Report Status PENDING     Imaging / Studies: CT IMAGE GUIDE DRAIN TRANSVAG TRANSRECT PERITONEAL RETROPER Result Date: 05/27/2024 Rosetta Cons, MD     05/27/2024  5:19 PM Pre procedural Dx: Mult abscesses Post procedural Dx: Same Technically successful CT guided placed of a 10 Fr drainage catheter placement into the abdominal and pelvic abscesses yielding pus and stool.  A representative aspirated sample was capped and sent to the laboratory for analysis.  EBL: Trace Complications: None immediate Zettie Hillock, MD Pager #: [phone]   CT GUIDED PERITONEAL/RETROPERITONEAL FLUID DRAIN BY [facility] CATH Result Date: 05/27/2024 Rosetta Cons, MD     05/27/2024  5:19 PM Pre procedural Dx: Mult abscesses Post procedural Dx: Same Technically successful CT guided placed of a 10 Fr drainage catheter placement into the abdominal and pelvic abscesses yielding pus and stool.  A representative aspirated sample was capped and sent to the laboratory for analysis.  EBL: Trace Complications: None immediate Zettie Hillock, MD Pager #: [phone]   CT ABDOMEN PELVIS W CONTRAST Result Date: 05/26/2024 CLINICAL DATA:  Abdominal pain, postop EXAM: CT ABDOMEN AND PELVIS WITH CONTRAST TECHNIQUE: Multidetector CT imaging of the abdomen and pelvis was performed using the standard protocol following bolus administration of intravenous contrast. RADIATION DOSE REDUCTION: This exam was performed according to the departmental dose-optimization program which includes automated exposure control, adjustment of the mA and/or kV according to patient size and/or use of iterative reconstruction technique. CONTRAST:  80mL OMNIPAQUE  IOHEXOL  300 MG/ML  SOLN COMPARISON:  None  Available. FINDINGS: Lower chest: Small bilateral pleural effusions. Compressive atelectasis in the lower lobes. Hepatobiliary: No focal hepatic abnormality. Gallbladder unremarkable. Pancreas: No focal abnormality or ductal dilatation. Spleen: No focal abnormality.  Normal size. Adrenals/Urinary Tract: Adrenal glands normal. Punctate left lower pole nephrolithiasis. No hydronephrosis. Benign appearing right lower pole cyst measures 6 cm. No follow-up imaging recommended. Stomach/Bowel: Postsurgical changes in the rectum. Again noted are probable frank dehiscence changes. Air again noted around the rectum in the perirectal space. Multiple small fluid collections and air collections in the abdomen and pelvis. The largest is noted anteriorly measuring 4 x 2.7 cm on image 47. This is just deep to and superior to and indwelling pigtail drainage catheter. Multiple other smaller fluid collections noted along the descending colon and sigmoid colon in the left lower quadrant and along small bowel loops in the right lower quadrant. No bowel obstruction. Vascular/Lymphatic: Aortic atherosclerosis. No evidence of aneurysm or adenopathy. IVC filter in place. Reproductive: Prior hysterectomy.  No adnexal masses. Other: Trace free fluid in the pelvis. A few locules of extraluminal gas scattered throughout the abdomen and pelvis within the fluid collections described above. Musculoskeletal: No acute bony abnormality. IMPRESSION: Similar appearance at the rectal anastomosis concerning for dehiscence. Interval placement of pigtail drainage catheter anteriorly in the pelvis. There are several small fluid collections throughout the abdomen and pelvis with largest anteriorly in the midline of the pelvis measuring 4 x 2.7 cm. Small bilateral pleural effusions. Compressive atelectasis in the lower lobes. Aortic atherosclerosis. Electronically Signed   By: Janeece Mechanic M.D.   On:  05/26/2024 14:44      Medications / Allergies: per  chart  Antibiotics: Anti-infectives (From admission, onward)    Start     Dose/Rate Route Frequency Ordered Stop   05/27/24 0800  vancomycin  (VANCOCIN ) IVPB 1000 mg/200 mL premix        1,000 mg 200 mL/hr over 60 Minutes Intravenous Every 24 hours 05/26/24 1144     05/23/24 1400  piperacillin -tazobactam (ZOSYN ) IVPB 3.375 g        3.375 g 12.5 mL/hr over 240 Minutes Intravenous Every 8 hours 05/23/24 0804     05/23/24 1200  micafungin (MYCAMINE) 100 mg in sodium chloride  0.9 % 100 mL IVPB        100 mg 105 mL/hr over 1 Hours Intravenous Every 24 hours 05/23/24 0755 06/06/24 1159   05/22/24 0800  vancomycin  (VANCOCIN ) IVPB 1000 mg/200 mL premix  Status:  Discontinued        1,000 mg 200 mL/hr over 60 Minutes Intravenous Every 24 hours 05/21/24 0634 05/26/24 0806   05/21/24 0730  vancomycin  (VANCOREADY) IVPB 1250 mg/250 mL        1,250 mg 166.7 mL/hr over 90 Minutes Intravenous  Once 05/21/24 0634 05/21/24 1043   05/19/24 1400  piperacillin -tazobactam (ZOSYN ) IVPB 3.375 g  Status:  Discontinued        3.375 g 12.5 mL/hr over 240 Minutes Intravenous Every 8 hours 05/19/24 1303 05/23/24 0804   05/09/24 0900  erythromycin  250 mg in sodium chloride  0.9 % 100 mL IVPB        250 mg 100 mL/hr over 60 Minutes Intravenous Every 8 hours 05/09/24 0732 05/11/24 0044   05/08/24 2200  cefoTEtan  (CEFOTAN ) 2 g in sodium chloride  0.9 % 100 mL IVPB        2 g 200 mL/hr over 30 Minutes Intravenous Every 12 hours 05/08/24 1759 05/09/24 0749   05/08/24 1400  neomycin  (MYCIFRADIN ) tablet 1,000 mg  Status:  Discontinued       Placed in And Linked Group   1,000 mg Oral 3 times per day 05/08/24 1119 05/08/24 1120   05/08/24 1400  metroNIDAZOLE  (FLAGYL ) tablet 1,000 mg  Status:  Discontinued       Placed in And Linked Group   1,000 mg Oral 3 times per day 05/08/24 1119 05/08/24 1120   05/08/24 1130  cefoTEtan  (CEFOTAN ) 2 g in sodium chloride  0.9 % 100 mL IVPB        2 g 200 mL/hr over 30 Minutes  Intravenous On call to O.R. 05/08/24 1119 05/09/24 0749         Note: Portions of this report may have been transcribed using voice recognition software. Every effort was made to ensure accuracy; however, inadvertent computerized transcription errors may be present.   Any transcriptional errors that result from this process are unintentional.    Eddye Goodie, MD, FACS, MASCRS Esophageal, Gastrointestinal & Colorectal Surgery Robotic and Minimally Invasive Surgery  [facility] [address] [place], [place] [address] [phone] Fax [phone] Main  CONTACT INFORMATION: Weekday (9AM-5PM): Call [facility] main office at [phone] Weeknight (5PM-9AM) or Weekend/Holiday: Check EPIC Web Links tab & use AMION (password  [email or web address]) for General Surgery [facility] coverage  Please, DO NOT use SecureChat  (it is not reliable communication to reach operating surgeons & will lead to a delay in care).   Epic staff messaging available for outptient concerns needing 1-2 business day response.  05/28/2024  7:20 AM

## 2024-05-29 DIAGNOSIS — K5790 Diverticulosis of intestine, part unspecified, without perforation or abscess without bleeding: Secondary | ICD-10-CM | POA: Diagnosis not present

## 2024-05-29 LAB — CBC
HCT: 29.7 % — ABNORMAL LOW (ref 36.0–46.0)
Hemoglobin: 9 g/dL — ABNORMAL LOW (ref 12.0–15.0)
MCH: 28.4 pg (ref 26.0–34.0)
MCHC: 30.3 g/dL (ref 30.0–36.0)
MCV: 93.7 fL (ref 80.0–100.0)
Platelets: 502 10*3/uL — ABNORMAL HIGH (ref 150–400)
RBC: 3.17 MIL/uL — ABNORMAL LOW (ref 3.87–5.11)
RDW: 16.1 % — ABNORMAL HIGH (ref 11.5–15.5)
WBC: 6.9 10*3/uL (ref 4.0–10.5)
nRBC: 0 % (ref 0.0–0.2)

## 2024-05-29 LAB — COMPREHENSIVE METABOLIC PANEL WITH GFR
ALT: 26 U/L (ref 0–44)
AST: 18 U/L (ref 15–41)
Albumin: 1.6 g/dL — ABNORMAL LOW (ref 3.5–5.0)
Alkaline Phosphatase: 198 U/L — ABNORMAL HIGH (ref 38–126)
Anion gap: 9 (ref 5–15)
BUN: 20 mg/dL (ref 8–23)
CO2: 24 mmol/L (ref 22–32)
Calcium: 8.1 mg/dL — ABNORMAL LOW (ref 8.9–10.3)
Chloride: 100 mmol/L (ref 98–111)
Creatinine, Ser: 0.84 mg/dL (ref 0.44–1.00)
GFR, Estimated: >60 mL/min (ref >60)
Glucose, Bld: 112 mg/dL — ABNORMAL HIGH (ref 70–99)
Potassium: 3.9 mmol/L (ref 3.5–5.1)
Sodium: 133 mmol/L — ABNORMAL LOW (ref 135–145)
Total Bilirubin: 0.7 mg/dL (ref 0.0–1.2)
Total Protein: 6.6 g/dL (ref 6.5–8.1)

## 2024-05-29 LAB — AEROBIC/ANAEROBIC CULTURE W GRAM STAIN (SURGICAL/DEEP WOUND): Gram Stain: NONE SEEN

## 2024-05-29 LAB — PHOSPHORUS: Phosphorus: 3.5 mg/dL (ref 2.5–4.6)

## 2024-05-29 LAB — MAGNESIUM: Magnesium: 1.8 mg/dL (ref 1.7–2.4)

## 2024-05-29 MED ORDER — SODIUM CHLORIDE 0.9% FLUSH
5.0000 mL | Freq: Three times a day (TID) | INTRAVENOUS | Status: DC
  Administered 2024-05-29 – 2024-06-04 (×17): 5 mL

## 2024-05-29 MED ORDER — DAPTOMYCIN-SODIUM CHLORIDE 500-0.9 MG/50ML-% IV SOLN
8.0000 mg/kg | Freq: Every day | INTRAVENOUS | Status: DC
  Administered 2024-05-29 – 2024-06-03 (×6): 500 mg via INTRAVENOUS
  Filled 2024-05-29 (×7): qty 50

## 2024-05-29 MED ORDER — LOPERAMIDE HCL 2 MG PO CAPS
4.0000 mg | ORAL_CAPSULE | Freq: Three times a day (TID) | ORAL | Status: DC
  Administered 2024-05-29 – 2024-06-03 (×17): 4 mg via ORAL
  Filled 2024-05-29 (×17): qty 2

## 2024-05-29 MED ORDER — DIPHENOXYLATE-ATROPINE 2.5-0.025 MG PO TABS: 2.0000 | ORAL_TABLET | Freq: Four times a day (QID) | ORAL | Status: DC | PRN

## 2024-05-29 MED ORDER — TRAVASOL 10 % IV SOLN
INTRAVENOUS | Status: AC
  Filled 2024-05-29: qty 537.6

## 2024-05-29 MED ORDER — KATE FARMS STANDARD 1.4 PO LIQD
325.0000 mL | Freq: Two times a day (BID) | ORAL | Status: DC
  Administered 2024-05-31: 325 mL via ORAL
  Filled 2024-05-29 (×9): qty 325

## 2024-05-29 MED ORDER — MEGESTROL ACETATE 40 MG PO TABS
40.0000 mg | ORAL_TABLET | Freq: Every day | ORAL | Status: DC
  Administered 2024-05-29 – 2024-06-01 (×4): 40 mg via ORAL
  Filled 2024-05-29 (×6): qty 1

## 2024-05-29 MED ORDER — NYSTATIN 100000 UNIT/GM EX POWD
Freq: Two times a day (BID) | CUTANEOUS | Status: DC
  Filled 2024-05-29: qty 15

## 2024-05-29 MED ORDER — APIXABAN 2.5 MG PO TABS
2.5000 mg | ORAL_TABLET | Freq: Two times a day (BID) | ORAL | Status: DC
  Administered 2024-05-29 – 2024-06-02 (×10): 2.5 mg via ORAL
  Filled 2024-05-29 (×12): qty 1

## 2024-05-29 NOTE — Progress Notes (Signed)
 Nutrition Follow-up  DOCUMENTATION CODES:   Severe malnutrition in context of chronic illness  INTERVENTION:   -Calorie Count #4 ordered per surgery.   -Will trial Johny Nap 1.4 PO BID, each provides 455 kcals and 20g protein, pt agreeable  -Reviewed soft and easy to digest foods for patient to try. Reviewed stool bulking foods.   -Pt unlikely to meet needs if doesn't consume supplements. Consider Cortrak tube placement vs palliative care consult.   -TPN per Pharmacy -Daily weights while on TPN  NUTRITION DIAGNOSIS:   Severe Malnutrition related to chronic illness as evidenced by severe fat depletion, severe muscle depletion.  Ongoing.  GOAL:   Patient will meet greater than or equal to 90% of their needs  Progressing  MONITOR:   Diet advancement, Labs, Weight trends  REASON FOR ASSESSMENT:   Consult Calorie Count, Poor PO, Wound healing  ASSESSMENT:   83 year old female with PMH HTN, mild cognitive impairment, asd large bowel obstruction (October 2024) s/p Hartman's resection and colostomy creation who was admitted 5/29 for colostomy reversal.  5/29 Admit; s/p colostomy reversal, small bowel resection, and extensive lysis of adhesions; CLD 5/30 Soft diet -> FLD 6/2 NPO; NGT placed; TPN to be initiated 6/3 CLD 6/4-6/5 Dysphagia 1 -> Soft-> Reg diet, Calorie Count #1 completed, consumed 11-14% of needs 6/6 Soft diet 6/7-6/8 Calorie Count #2 completed, consumed 10-22% of needs 6/9 Heart Healthy -> NPO 6/10 s/p drain placement 6/11 CLD 6/12 FLD -> Regular diet -> soft diet 6/13 Regular diet 6/16 Heart Healthy diet 6/17 NPO ->Regular diet  So far today, pt not consuming any PO of meals. 0% of breakfast and 0% lunch. Spoke with patient about menu options, quick to say what she doesn't like. Willing to try pasta and toast. Reviewed BRAT diet and other foods to help with stool bulking. Doesn't like bananas or applesauce. Willing to try [employer] as it is dairy  free. Not wanting other protein options.  Recommend Cortrak placement if PO doesn't improve.  Surgery ordered appetite stimulant and calorie count. Not optimistic pt will ever meet needs, especially if doesn't consume supplements.  TPN to half rate, 40 ml/hr, providing 960 kcals and 53g protein.  Admission weight: 127 lbs Current weight: 134 lbs  Medications: Vitamin B-12, Imodium , Megace, Multivitamin with minerals daily, Metamucil  Labs reviewed: Low Na   Diet Order:   Diet Order             Diet regular Room service appropriate? Yes; Fluid consistency: Thin  Diet effective now           Diet - low sodium heart healthy                   EDUCATION NEEDS:   Education needs have been addressed  Skin:  Skin Assessment: Skin Integrity Issues: Skin Integrity Issues:: Stage II, Incisions Stage II: Right Buttocks Incisions: Abdomen  Last BM:  6/18 -type 7  Height:   Ht Readings from Last 1 Encounters:  05/09/24 5' 5 (1.651 m)    Weight:   Wt Readings from Last 1 Encounters:  05/29/24 60.9 kg    BMI:  Body mass index is 22.34 kg/m.  Estimated Nutritional Needs:   Kcal:  1600-1750 kcals  Protein:  80-95 grams  Fluid:  >/= 1.6L  Arna Better, MS, RD, LDN Inpatient Clinical Dietitian Contact via Secure chat

## 2024-05-29 NOTE — Progress Notes (Signed)
 PT Cancellation Note  Patient Details Name: Elizabeth Odom MRN: [id number] DOB: [birth year]   Cancelled Treatment:     Pt too fatigued after being cleaned up.  Pt continue to have loose uncontrolled loose stools.  Pt has had multiple complications after her Colostomy Takedown with extended LOS now 20 days.  Pt was hoping to D/C to home and only stay a few days, updated rec are now for SNF.  Rehab Team to continue to follow.  Bess Broody  PTA Acute  Rehabilitation Services Office M-F          [phone]

## 2024-05-29 NOTE — Progress Notes (Signed)
 PHARMACY - TOTAL PARENTERAL NUTRITION CONSULT NOTE   Indication: Prolonged ileus  Patient Measurements: Height: 5' 5 (165.1 cm) Weight: 60.9 kg (134 lb 4.2 oz) IBW/kg (Calculated) : 57 TPN AdjBW (KG): 61.3 Body mass index is 22.34 kg/m.  Assessment: 58 yoF with history of diverticulitis with perforation and abscess. 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 She requested ostomy takedown and underwent LOA, colostomy takedown, small bowel resection with anastomosis on 5/29. Pharmacy is consulted to dose TPN starting 6/2 for postop ileus.  Now with anastomotic leak complicated by multiple-intraabdominal abscesses.  Glucose / Insulin : no Hx DM, A1c 5.1%. BG goal 100-180. - CBG/SSI discontinued on 6/11. BG 112 this morning. Electrolytes: Na (133) slightly low on max Na concentration -All others WNL, including CorrCa (10) Hepatic: LFTs, Tbili, TG WNL.  -Albumin  low. Alk Phos trending down slowly I/O:  No mIVF -PO intake: 840 mL  -UOP: x5, stool x5, drain: 20 mL GI Imaging:  - 6/9 CT: [name] dehiscence along the rectal anastomosis, free air and multiple fluid collections GI Surgeries / Procedures: - 5/29: LAR, SBR, colostomy takedown, LOA - 6/10 drains placed x 2 in IR (midline abd, transgluteal) - 6/17: Replacement of drainage catheter by IR  Central access: Double lumen PICC placed 6/2 TPN start date: 6/2  Nutritional Goals: - Goal TPN rate is 70 mL/hr (provides 94 g of protein and 1680 kcals per day)  RD Assessment:  Estimated Needs Total Energy Estimated Needs: 1600-1750 kcals Total Protein Estimated Needs: 80-95 grams Total Fluid Estimated Needs: >/= 1.6L  Current Nutrition:  - TPN (weaned to half-rate) - Diet: Regular  Calorie count completed from 6/13 > 6/16. Not meeting needs. -Pt continues to refuse all Boost supplementation, Magic cup.  Plan:   Received orders from [facility] to gradually wean off of TPN. Reached out to [facility] on 6/19 to inquire about ability to stop TPN > plan is to continue TPN at half of goal rate for now.   At 18:00 Continue TPN at 40 mL/hr Will provide 54 g protein, 960 kcal Electrolytes in TPN: No change Na 150 mEq/L (max) K 50 mEq/L Ca 5 mEq/L Mg 5 mEq/L Phos 15 mmol/L Cl:Ac 1:1 No CBG checks/SSI  MVI PO Monitor TPN labs on Mon/Thurs, and as needed.  Shireen Dory, PharmD 05/29/24 7:53 AM

## 2024-05-29 NOTE — Progress Notes (Signed)
 05/29/2024  Elizabeth Odom [id number] 27-Aug-1941  CARE TEAM: PCP: Anthon Kins, MD  Outpatient Care Team: Patient Care Team: Anthon Kins, MD as PCP - General (Internal Medicine) Cody Das, MD as PCP - Cardiology (Cardiology) Lajuan Pila, MD as Consulting Physician (Gastroenterology) Shela Derby, MD as Consulting Physician (General Surgery) Glory Larsen, MD (Neurology) Lahoma Pigg, MD as Consulting Physician (Urology) Sandee Crook Margart Shears, MD as Consulting Physician (Cardiology) Candyce Champagne, MD as Consulting Physician (Colon and Rectal Surgery)  Inpatient Treatment Team: Treatment Team:  Candyce Champagne, MD Ruel Cotta, Heinz Llano, MD Ozell Blunt, MD Vladimir Groves Radiology, MD Lexine Redder, MD Audria Leather, MD Allred, Boyd Cabal, PA-C Mugweru, Sam Creighton, MD Rosetta Cons, MD Verlyn Goad, MD Orlie Bjornstad, MD Chesley Cost, PTA Missy Amos, NT Alford Im, RN Shireen Dory, [place] Creta Dolin, RN Amaryllis Junior, LCSW   Problem List:   Principal Problem:   Diverticular disease Active Problems:   A-fib [facility])   Restless leg   ABLA (acute blood loss anemia)   Need for emotional support   Acute urinary retention   Mixed hyperlipidemia   Essential hypertension   Diverticular disease of left colon   History of DVT (deep vein thrombosis)   History of pulmonary embolus (PE)   Pre-diabetes   Presence of IVC filter   Stricture of sigmoid colon (HCC)   Protein-calorie malnutrition, severe   Hypokalemia   05/08/2024  POST-OPERATIVE DIAGNOSIS:   COLOSTOMY FOR RESECTION, DESIRE FOR OSTOMY TAKEDOWN RECTAL STRICTURE HISTORY OF DIVERTICULITIS WITH PERFORATION & ABSCESS   PROCEDURE:   -ROBOTIC RECTOSIGMOID RESECTION (LAR) -TAKEDOWN OF END COLOSTOMY WITH ANASTOMOSIS -RESECTION OF SMALL INTESTINE WITH ANASTOMOSIS -SMALL BOWEL REPAIR -LYSIS OF ADHESIONS x 115 MINUTES (66% OF CASE),  -INTRAOPERATIVE  ASSESSMENT OF TISSUE VASCULAR PERFUSION USING ICG (indocyanine green ) IMMUNOFLUORESCENCE,  -TRANSVERSUS ABDOMINIS PLANE (TAP) BLOCK - BILATERAL -FLEXIBLE SIGMOIDOSCOPY   SURGEON:  Eddye Goodie, MD  OR FINDINGS:  Very dense adhesions of small bowel especially to pelvis and retroperitoneum.  Extremely concrete dense adhesions with friable tissue near her old prior small bowel anastomosis and distal jejunum.  Required small bowel repair and required small bowel resection including old anastomosis given severe tissues.  Patient had significant fibrotic stricturing of rectal stump at the mid/distal rectal junction.  Could not be released up.  Ended up doing resection of rectal stump to mid/distal rectal junction.  No obvious metastatic disease on visceral parietal peritoneum or liver.   It is a 29mm EEA anastomosis ( distal descending colon  connected to mid/distal rectal junction.)  It rests 7 cm from the anal verge by rigid proctoscopy.  05/20/2024  Post procedural Dx: Post op abscess, multiple   Technically successful CT guided placed of a  10 Fr drainage catheter placement x 2  into the midline ventral and dorsal perirectal abscesses.     Rosetta Cons, MD    Assessment [facility] Stay = 21 days) 21 Days Post-Op    Diarrhea in the setting of contained colorectal anastomotic leak    Plan:  Internal drainage  Perc drain in abdomen collapsed - redirected to last major abscess - decompressed.  That abscess is growing no organisms, a guardedly hopeful sign that most of the infection is getting under control.  New transgluteal drain replaced - need to keep near contained leak  Antidiarrheal regimen -  Psyllium and loperamide .  Having loose BMs again after tapering - go back  up on loperamide .  Will go to 4 mg before every meal/nightly.  Also have Lomotil for breakthrough   Growing numerous organisms including Pseudomonas, MRSA and Candida albicans.    Vancomycin ,  piperacillin /tazobactam and micafungin.    Would be helpful to get sensitivities on C. albicans to make sure it is not resistant.  Anticipate 14-day course of antibiotics for now given her malnourished and elderly state and significant pelvic contamination.  ID to help with outpatient   Pain control - opioids make her rather sedated/groggy.   Severe malnutrition   TPN for severe protein malnutrition.    Retry calorie counts now that all the drains are done.  Will do half TPN for now.  Add Megace  Long discussion with the patient.  She is trying to avoid any foods that she gets diarrhea.  I noted that is not a good long-term plan.  Would like to avoid a Corpak or feeding G-tube if possible.  She would like to hold off and will reconsider.  Prealbumin starting to climb in more normal range which is a guardedly hopeful sign.    History of urinary retention  Foley catheter out with no problems with urination now that pelvic collection under better control.    Continue tamsulosin    if fails then replace and leave for several weeks until can follow-up with urology as an outpatient.  Less likely now  No rectal tubes.  Atrial fibrillation postop.  History of a prior event.    Cardiology signed off for now.  Reconsult as needed.  Stop telemetry for now  Restarted DOAC Eliquis  6/12 after prior hematochezia.  No strong evidence of that.  Follow hemoglobin.  Holding Eliquis  6/15 in anticipation of drain replacement 6/17, and restart 6/18  Rate controlled switch from amlodipine  to metoprolol .  Less tachycardic.  Blood pressure soft.  Back down 12.5 mg of metoprolol  and follow with PRN (as needed) breakthrough  -monitor electrolytes & replace as needed.  Keep K>4, Mg>2, Phos>3  -VTE prophylaxis- SCDs.  Anticoagulation as appropriate  -mobilize as tolerated to help recovery.  Enlist therapies in moderate/high risk patients as appropriate.  Patient is refusing skilled facility or any  rehab but is not getting up much.  Try to have strategies to help her get up and mobilize and actually work with physical therapy, or she is going to lose her argument.    I updated the patient's status to the patient and nurse.  Recommendations were made.  Questions were answered.  They expressed understanding & appreciation.  -Disposition: TBD - I think she needs SNF.  She has been very resistant, but she may reconsider      I reviewed last 24 h vitals and pain scores, last 48 h intake and output, last 24 h labs and trends, and last 24 h imaging results.  I have reviewed this patient's available data, including medical history, events of note, test results, etc as part of my evaluation.   A significant portion of that time was spent in counseling. Care during the described time interval was provided by me.  This care required moderate level of medical decision making.  05/29/2024    Subjective: (Chief complaint)  Patient had loose bowel movement again this morning.  Diarrhea has calmed down but not gone away.  Prefers to stay in bed.  Afraid to walk left she leak.  Nurses note much less accidents.  Prefers not to eat or drink for fear of getting diarrhea.  Does not  like the supplemental shakes.  Does not like the hospital food.  Denies abdominal pain.    Objective:  Vital signs:  Vitals:   05/28/24 1751 05/28/24 2120 05/29/24 0437 05/29/24 0500  BP: (!) 102/54 116/66 (!) 129/56   Pulse: 75 75 (!) 103   Resp:  14 15   Temp:  98 F (36.7 C) 98.5 F (36.9 C)   TempSrc:  Oral    SpO2:  99% 96%   Weight:    60.9 kg  Height:        Last BM Date : 05/28/24  Intake/Output   Yesterday:  06/18 0701 - 06/19 0700 In: 2076 [P.O.:840; I.V.:640.9; IV Piggyback:575.2] Out: 20 [Drains:20] This shift:  No intake/output data recorded.  Bowel function:  Flatus: YES  BM:  YES - loose oatmeal consistency   Drain:  Right lower quadrant drain: Primarily serous   Transgluteal: light brown  Physical Exam:  General: Alert.  More talkative.       Eyes: PERRL, normal EOM.  Sclera clear.  No icterus Neuro: CN II-XII intact w/o focal sensory/motor deficits.  All extremities.  No facial droop. Lymph: No head/neck/groin lymphadenopathy Psych:  No delerium/psychosis/paranoia.  Oriented x 4 HENT: Normocephalic, Mucus membranes moist.  No thrush.  Mild HOH Neck: Supple, No tracheal deviation.  No obvious thyromegaly.  Prefers to sit with head flexed and chin down.  Does not want to have her neck back.  Pillow adjusted. Chest: No pain to chest wall compression.  Good respiratory excursion.  No audible wheezing CV:  Pulses intact.  Regular rhythm.  No major extremity edema MS: Normal AROM mjr joints.  No obvious deformity  Abdomen:  Soft.  Mildy distended.  Nontender.  No guarding.   No complaints of pain to percussion or benching or cough. No evidence of peritonitis. Old ostomy incision with normal healing ridge.    GU: No Foley Rectal: Scant thick stool perianally.  There is some barrier cream.  Some erythema and irritation  Ext:   No deformity.  No mjr edema.  No cyanosis Skin: No petechiae / purpurea.  No major sores.  Warm and dry    Results:   Cultures: Recent Results (from the past 720 hours)  Urine Culture     Status: Abnormal   Collection Time: 05/09/24  8:30 PM   Specimen: Urine, Clean Catch  Result Value Ref Range Status   Specimen Description   Final    URINE, CLEAN CATCH Performed at [facility], [address], [place], [place] [address]    Special Requests   Final    NONE Performed at [facility], [address] [place], [place] [address]    Culture (A)  Final    <10,000 COLONIES/mL INSIGNIFICANT GROWTH Performed at [facility], [address] [place], [place] [address]    Report Status 05/11/2024 FINAL  Final  Aerobic/Anaerobic Culture w Gram Stain (surgical/deep wound)      Status: None (Preliminary result)   Collection Time: 05/22/24  3:59 PM   Specimen: Abdomen  Result Value Ref Range Status   Specimen Description   Final    ABDOMEN Performed at [facility], [address], [place], [place] [address]    Special Requests   Final    ABDOMINAL DRAIN Performed at [facility], [address], [place], [place] [address]    Gram Stain   Final    ABUNDANT WBC PRESENT, PREDOMINANTLY PMN  RARE GRAM POSITIVE COCCI RARE YEAST WITH PSEUDOHYPHAE    Culture   Final    MODERATE PSEUDOMONAS AERUGINOSA MODERATE STAPHYLOCOCCUS AUREUS SUSCEPTIBILITIES PERFORMED ON PREVIOUS CULTURE WITHIN THE LAST 5 DAYS. MODERATE CANDIDA ALBICANS Sent to [facility] for further susceptibility testing. NO ANAEROBES ISOLATED Performed at [facility], [address] [place], [place] [address]    Report Status PENDING  Incomplete   Organism ID, Bacteria PSEUDOMONAS AERUGINOSA  Final      Susceptibility   Pseudomonas aeruginosa - MIC*    CEFTAZIDIME 4 SENSITIVE Sensitive     CIPROFLOXACIN <=0.25 SENSITIVE Sensitive     GENTAMICIN 4 SENSITIVE Sensitive     IMIPENEM 2 SENSITIVE Sensitive     PIP/TAZO <=4 SENSITIVE Sensitive ug/mL    CEFEPIME 2 SENSITIVE Sensitive     * MODERATE PSEUDOMONAS AERUGINOSA  Aerobic/Anaerobic Culture w Gram Stain (surgical/deep wound)     Status: None   Collection Time: 05/22/24  3:59 PM   Specimen: Back  Result Value Ref Range Status   Specimen Description   Final    BACK Performed at [facility], [address], [place], [place] [address]    Special Requests   Final    TRANSGLUTEAL PERCUTANEOUS IR DRAIN Performed at [facility], [address] [place], [place] [address]    Gram Stain   Final    NO WBC SEEN ABUNDANT GRAM NEGATIVE RODS RARE GRAM POSITIVE COCCI IN PAIRS RARE BUDDING YEAST SEEN    Culture   Final    ABUNDANT PSEUDOMONAS AERUGINOSA ABUNDANT  METHICILLIN RESISTANT STAPHYLOCOCCUS AUREUS ABUNDANT ENTEROCOCCUS FAECALIS ABUNDANT CANDIDA ALBICANS NO ANAEROBES ISOLATED Performed at [facility], [address], [place], [place] [address]    Report Status 05/27/2024 FINAL  Final   Organism ID, Bacteria PSEUDOMONAS AERUGINOSA  Final   Organism ID, Bacteria METHICILLIN RESISTANT STAPHYLOCOCCUS AUREUS  Final   Organism ID, Bacteria ENTEROCOCCUS FAECALIS  Final      Susceptibility   Enterococcus faecalis - MIC*    AMPICILLIN <=2 SENSITIVE Sensitive     VANCOMYCIN  1 SENSITIVE Sensitive     GENTAMICIN SYNERGY SENSITIVE Sensitive     * ABUNDANT ENTEROCOCCUS FAECALIS   Methicillin resistant staphylococcus aureus - MIC*    CIPROFLOXACIN >=8 RESISTANT Resistant     ERYTHROMYCIN  >=8 RESISTANT Resistant     GENTAMICIN <=0.5 SENSITIVE Sensitive     OXACILLIN >=4 RESISTANT Resistant     TETRACYCLINE <=1 SENSITIVE Sensitive     VANCOMYCIN  1 SENSITIVE Sensitive     TRIMETH/SULFA >=320 RESISTANT Resistant     CLINDAMYCIN <=0.25 SENSITIVE Sensitive     RIFAMPIN <=0.5 SENSITIVE Sensitive     Inducible Clindamycin NEGATIVE Sensitive     LINEZOLID 2 SENSITIVE Sensitive     * ABUNDANT METHICILLIN RESISTANT STAPHYLOCOCCUS AUREUS   Pseudomonas aeruginosa - MIC*    CEFTAZIDIME 4 SENSITIVE Sensitive     CIPROFLOXACIN <=0.25 SENSITIVE Sensitive     GENTAMICIN 4 SENSITIVE Sensitive     IMIPENEM 2 SENSITIVE Sensitive     PIP/TAZO <=4 SENSITIVE Sensitive ug/mL    CEFEPIME 2 SENSITIVE Sensitive     * ABUNDANT PSEUDOMONAS AERUGINOSA  Aerobic/Anaerobic Culture w Gram Stain (surgical/deep wound)     Status: None (Preliminary result)   Collection Time: 05/27/24  5:19 PM   Specimen: Abscess  Result Value Ref Range Status   Specimen Description   Final    ABSCESS ABDOMEN Performed at [facility], [address]  [place], [place] [address]    Special Requests   Final    NONE Performed at [facility], [address] [place], [place] [address]    Gram Stain   Final    ABUNDANT WBC PRESENT, PREDOMINANTLY PMN NO ORGANISMS SEEN    Culture   Final    NO GROWTH < 24 HOURS Performed at [facility], [address] [place], [place] [address]    Report Status PENDING  Incomplete    Labs: Results for orders placed or performed during the hospital encounter of 05/08/24 (from the past 48 hours)  Aerobic/Anaerobic Culture w Gram Stain (surgical/deep wound)     Status: None (Preliminary result)   Collection Time: 05/27/24  5:19 PM   Specimen: Abscess  Result Value Ref Range   Specimen Description      ABSCESS ABDOMEN Performed at [facility], [address] [place], [place] [address]    Special Requests      NONE Performed at [facility], [address], [place], [place] [address]    Gram Stain      ABUNDANT WBC PRESENT, PREDOMINANTLY PMN NO ORGANISMS SEEN    Culture      NO GROWTH < 24 HOURS Performed at [facility], [address] [place], [place] [address]    Report Status PENDING   Basic metabolic panel with GFR     Status: Abnormal   Collection Time: 05/28/24  6:51 AM  Result Value Ref Range   Sodium 133 (L) 135 - 145 mmol/L   Potassium 4.0 3.5 - 5.1 mmol/L   Chloride 101 98 - 111 mmol/L   CO2 23 22 - 32 mmol/L   Glucose, Bld 125 (H) 70 - 99 mg/dL    Comment: Glucose reference range applies only to samples taken after fasting for at least 8 hours.   BUN 19 8 - 23 mg/dL   Creatinine, Ser 2.84 0.44 - 1.00 mg/dL   Calcium  7.9 (L) 8.9 - 10.3 mg/dL   GFR, Estimated >13 >24 mL/min    Comment: (NOTE) Calculated using the CKD-EPI Creatinine Equation (2021)    Anion gap 9 5 - 15    Comment: Performed at [facility], [address], [place], [place] [address]  Magnesium      Status: None   Collection Time: 05/28/24  6:51 AM  Result Value Ref Range   Magnesium  1.9 1.7 - 2.4 mg/dL    Comment: Performed at  [facility], [address], [place], [place] [address]  Phosphorus     Status: None   Collection Time: 05/28/24  6:51 AM  Result Value Ref Range   Phosphorus 3.3 2.5 - 4.6 mg/dL    Comment: Performed at [facility], [address], [place], [place] [address]  Vancomycin , trough     Status: Abnormal   Collection Time: 05/28/24  6:51 AM  Result Value Ref Range   Vancomycin  Tr 5 (L) 15 - 20 ug/mL    Comment: Performed at [facility], [address] [place], [place] [address]  Vancomycin , peak     Status: Abnormal   Collection Time: 05/28/24 11:32 AM  Result Value Ref Range   Vancomycin  Pk 23 (L) 30 - 40 ug/mL    Comment: Performed at [facility], [address] [place], [place] [address]  Comprehensive metabolic panel     Status: Abnormal   Collection Time:  05/29/24  4:09 AM  Result Value Ref Range   Sodium 133 (L) 135 - 145 mmol/L   Potassium 3.9 3.5 - 5.1 mmol/L   Chloride 100 98 - 111 mmol/L   CO2 24 22 - 32 mmol/L   Glucose, Bld 112 (H) 70 - 99 mg/dL    Comment: Glucose reference range applies only to samples taken after fasting for at least 8 hours.   BUN 20 8 - 23 mg/dL   Creatinine, Ser 7.82 0.44 - 1.00 mg/dL   Calcium  8.1 (L) 8.9 - 10.3 mg/dL   Total Protein 6.6 6.5 - 8.1 g/dL   Albumin  1.6 (L) 3.5 - 5.0 g/dL   AST 18 15 - 41 U/L   ALT 26 0 - 44 U/L   Alkaline Phosphatase 198 (H) 38 - 126 U/L   Total Bilirubin 0.7 0.0 - 1.2 mg/dL   GFR, Estimated >95 >62 mL/min    Comment: (NOTE) Calculated using the CKD-EPI Creatinine Equation (2021)    Anion gap 9 5 - 15    Comment: Performed at [facility], [address], [place], [place] [address]  Magnesium      Status: None   Collection Time: 05/29/24  4:09 AM  Result Value Ref Range   Magnesium  1.8 1.7 - 2.4 mg/dL    Comment: Performed at [facility], [address] [place], [place] [address]  Phosphorus     Status:  None   Collection Time: 05/29/24  4:09 AM  Result Value Ref Range   Phosphorus 3.5 2.5 - 4.6 mg/dL    Comment: Performed at [facility], [address] [place], [place] [address]  CBC     Status: Abnormal   Collection Time: 05/29/24  4:09 AM  Result Value Ref Range   WBC 6.9 4.0 - 10.5 K/uL   RBC 3.17 (L) 3.87 - 5.11 MIL/uL   Hemoglobin 9.0 (L) 12.0 - 15.0 g/dL   HCT 28.4 (L) 13.2 - 44.0 %   MCV 93.7 80.0 - 100.0 fL   MCH 28.4 26.0 - 34.0 pg   MCHC 30.3 30.0 - 36.0 g/dL   RDW 10.2 (H) 72.5 - 36.6 %   Platelets 502 (H) 150 - 400 K/uL   nRBC 0.0 0.0 - 0.2 %    Comment: Performed at [facility], [address], [place], [place] [address]    Imaging / Studies: CT IMAGE GUIDE DRAIN TRANSVAG TRANSRECT PERITONEAL RETROPER Result Date: 05/28/2024 INDICATION: Numerous small abdominal and pelvic abscesses in a postop patient. Previously 2 drains were placed. One drain demonstrates resolution of the abscess. The other drain has been accidentally removed. The patient needs a new drain placed in the pelvic region where the previous string was accidentally removed and a new drain placed and a maturing midline pelvic abscess. The indwelling catheter will be removed. EXAM: CT-guided drain placement x2 and removal of indwelling drainage catheter TECHNIQUE: Multidetector CT imaging of the pelvis was performed following the standard protocol with/without IV contrast. RADIATION DOSE REDUCTION: This exam was performed according to the departmental dose-optimization program which includes automated exposure control, adjustment of the mA and/or kV according to patient size and/or use of iterative reconstruction technique. MEDICATIONS: The patient is currently admitted to the hospital and receiving intravenous antibiotics. The antibiotics were administered within an appropriate time frame prior to the initiation of the procedure. ANESTHESIA/SEDATION: Moderate (conscious)  sedation was employed during this procedure. A total of Versed  2 mg and  Fentanyl  100 mcg was administered intravenously by the radiology nurse. Total intra-service moderate Sedation Time: 47 minutes. The patient's level of consciousness and vital signs were monitored continuously by radiology nursing throughout the procedure under my direct supervision. COMPLICATIONS: None immediate. PROCEDURE: Informed written consent was obtained from the patient after a thorough discussion of the procedural risks, benefits and alternatives. All questions were addressed. Maximal Sterile Barrier Technique was utilized including caps, mask, sterile gowns, sterile gloves, sterile drape, hand hygiene and skin antiseptic. A timeout was performed prior to the initiation of the procedure. With patient in a supine position on the CT gantry, radiopaque markers were placed on patient's skin. Patient is status and marked, prepped, and draped in usual sterile fashion. A 7 cm Yueh needle was then advanced through a small incision after local anesthesia was achieved with 1% lidocaine . The midline pelvic abscess was then accessed with a Yueh needle as demonstrated with follow-up CT imaging. The needle was removed and a short Amplatz wire was then advanced through the Yueh cannula. The Wendel Hals was removed and the access site was dilated using a 10 [place] dilator. A 10 French pigtail drainage catheter was then advanced over the guidewire coiled in the pelvic abscess. Retention sutures sterile dressing applied. The indwelling midline drainage catheter was then removed by cutting the retention suture and then cutting the catheter and discarded. Slow gentle traction was then applied to the antral portion of the catheter into the catheter was completely removed. Sterile dressing was applied. The patient was then turned to a prone position and radiopaque markers were placed on the patient's skin. Initial axial imaging was obtained in this position. The  patient was scanned was then marked, prepped, and draped in usual sterile fashion. Local anesthesia was achieved with 1% lidocaine . A 10 cm Yueh needle was then advanced into the perirectal abscess. Needle position was verified with serial CT imaging until the abscess was accessed. The needle was then removed leaving the cannula. A short Amplatz wire was then advanced and coiled within the abscess. The UB cannula was then retrieved and the access site was dilated using a 10 French fascial dilator. A 10 French drainage catheter was then advanced over the guidewire coiled within the abscess. Retention suture sterile dressings were applied. Both drainage catheters were connected to a JP bulb. Orders for flushing and care of the drainage catheters placed in the patient's chart. IMPRESSION: Satisfactory removal of the midline anterior abscess drainage catheter and placement of a new midline anterior pelvic drainage catheter as well as a posterior transgluteal drainage catheter in pelvic abscesses. Electronically Signed   By: Susan Ensign   On: 05/28/2024 10:11   CT GUIDED PERITONEAL/RETROPERITONEAL FLUID DRAIN BY PERC CATH Result Date: 05/28/2024 INDICATION: Numerous small abdominal and pelvic abscesses in a postop patient. Previously 2 drains were placed. One drain demonstrates resolution of the abscess. The other drain has been accidentally removed. The patient needs a new drain placed in the pelvic region where the previous string was accidentally removed and a new drain placed and a maturing midline pelvic abscess. The indwelling catheter will be removed. EXAM: CT-guided drain placement x2 and removal of indwelling drainage catheter TECHNIQUE: Multidetector CT imaging of the pelvis was performed following the standard protocol with/without IV contrast. RADIATION DOSE REDUCTION: This exam was performed according to the departmental dose-optimization program which includes automated exposure control, adjustment  of the mA and/or kV according to patient size and/or use of iterative reconstruction technique. MEDICATIONS: The patient  is currently admitted to the hospital and receiving intravenous antibiotics. The antibiotics were administered within an appropriate time frame prior to the initiation of the procedure. ANESTHESIA/SEDATION: Moderate (conscious) sedation was employed during this procedure. A total of Versed  2 mg and Fentanyl  100 mcg was administered intravenously by the radiology nurse. Total intra-service moderate Sedation Time: 47 minutes. The patient's level of consciousness and vital signs were monitored continuously by radiology nursing throughout the procedure under my direct supervision. COMPLICATIONS: None immediate. PROCEDURE: Informed written consent was obtained from the patient after a thorough discussion of the procedural risks, benefits and alternatives. All questions were addressed. Maximal Sterile Barrier Technique was utilized including caps, mask, sterile gowns, sterile gloves, sterile drape, hand hygiene and skin antiseptic. A timeout was performed prior to the initiation of the procedure. With patient in a supine position on the CT gantry, radiopaque markers were placed on patient's skin. Patient is status and marked, prepped, and draped in usual sterile fashion. A 7 cm Yueh needle was then advanced through a small incision after local anesthesia was achieved with 1% lidocaine . The midline pelvic abscess was then accessed with a Yueh needle as demonstrated with follow-up CT imaging. The needle was removed and a short Amplatz wire was then advanced through the Yueh cannula. The Wendel Hals was removed and the access site was dilated using a 10 [place] dilator. A 10 French pigtail drainage catheter was then advanced over the guidewire coiled in the pelvic abscess. Retention sutures sterile dressing applied. The indwelling midline drainage catheter was then removed by cutting the retention suture and then  cutting the catheter and discarded. Slow gentle traction was then applied to the antral portion of the catheter into the catheter was completely removed. Sterile dressing was applied. The patient was then turned to a prone position and radiopaque markers were placed on the patient's skin. Initial axial imaging was obtained in this position. The patient was scanned was then marked, prepped, and draped in usual sterile fashion. Local anesthesia was achieved with 1% lidocaine . A 10 cm Yueh needle was then advanced into the perirectal abscess. Needle position was verified with serial CT imaging until the abscess was accessed. The needle was then removed leaving the cannula. A short Amplatz wire was then advanced and coiled within the abscess. The UB cannula was then retrieved and the access site was dilated using a 10 French fascial dilator. A 10 French drainage catheter was then advanced over the guidewire coiled within the abscess. Retention suture sterile dressings were applied. Both drainage catheters were connected to a JP bulb. Orders for flushing and care of the drainage catheters placed in the patient's chart. IMPRESSION: Satisfactory removal of the midline anterior abscess drainage catheter and placement of a new midline anterior pelvic drainage catheter as well as a posterior transgluteal drainage catheter in pelvic abscesses. Electronically Signed   By: Susan Ensign   On: 05/28/2024 10:11      Medications / Allergies: per chart  Antibiotics: Anti-infectives (From admission, onward)    Start     Dose/Rate Route Frequency Ordered Stop   05/28/24 2100  vancomycin  (VANCOREADY) IVPB 750 mg/150 mL        750 mg 150 mL/hr over 60 Minutes Intravenous Every 12 hours 05/28/24 1419     05/27/24 0800  vancomycin  (VANCOCIN ) IVPB 1000 mg/200 mL premix  Status:  Discontinued        1,000 mg 200 mL/hr over 60 Minutes Intravenous Every 24 hours 05/26/24 1144 05/28/24 1419  05/23/24 1400   piperacillin -tazobactam (ZOSYN ) IVPB 3.375 g        3.375 g 12.5 mL/hr over 240 Minutes Intravenous Every 8 hours 05/23/24 0804     05/23/24 1200  micafungin (MYCAMINE) 100 mg in sodium chloride  0.9 % 100 mL IVPB        100 mg 105 mL/hr over 1 Hours Intravenous Every 24 hours 05/23/24 0755 06/06/24 1159   05/22/24 0800  vancomycin  (VANCOCIN ) IVPB 1000 mg/200 mL premix  Status:  Discontinued        1,000 mg 200 mL/hr over 60 Minutes Intravenous Every 24 hours 05/21/24 0634 05/26/24 0806   05/21/24 0730  vancomycin  (VANCOREADY) IVPB 1250 mg/250 mL        1,250 mg 166.7 mL/hr over 90 Minutes Intravenous  Once 05/21/24 0634 05/21/24 1043   05/19/24 1400  piperacillin -tazobactam (ZOSYN ) IVPB 3.375 g  Status:  Discontinued        3.375 g 12.5 mL/hr over 240 Minutes Intravenous Every 8 hours 05/19/24 1303 05/23/24 0804   05/09/24 0900  erythromycin  250 mg in sodium chloride  0.9 % 100 mL IVPB        250 mg 100 mL/hr over 60 Minutes Intravenous Every 8 hours 05/09/24 0732 05/11/24 0044   05/08/24 2200  cefoTEtan  (CEFOTAN ) 2 g in sodium chloride  0.9 % 100 mL IVPB        2 g 200 mL/hr over 30 Minutes Intravenous Every 12 hours 05/08/24 1759 05/09/24 0749   05/08/24 1400  neomycin  (MYCIFRADIN ) tablet 1,000 mg  Status:  Discontinued       Placed in And Linked Group   1,000 mg Oral 3 times per day 05/08/24 1119 05/08/24 1120   05/08/24 1400  metroNIDAZOLE  (FLAGYL ) tablet 1,000 mg  Status:  Discontinued       Placed in And Linked Group   1,000 mg Oral 3 times per day 05/08/24 1119 05/08/24 1120   05/08/24 1130  cefoTEtan  (CEFOTAN ) 2 g in sodium chloride  0.9 % 100 mL IVPB        2 g 200 mL/hr over 30 Minutes Intravenous On call to O.R. 05/08/24 1119 05/09/24 0749         Note: Portions of this report may have been transcribed using voice recognition software. Every effort was made to ensure accuracy; however, inadvertent computerized transcription errors may be present.   Any transcriptional  errors that result from this process are unintentional.    Eddye Goodie, MD, FACS, MASCRS Esophageal, Gastrointestinal & Colorectal Surgery Robotic and Minimally Invasive Surgery  [facility] [address] [place], [place] [address] [phone] Fax [phone] Main  CONTACT INFORMATION: Weekday (9AM-5PM): Call [facility] main office at [phone] Weeknight (5PM-9AM) or Weekend/Holiday: Check EPIC Web Links tab & use AMION (password  [email or web address]) for General Surgery [facility] coverage  Please, DO NOT use SecureChat  (it is not reliable communication to reach operating surgeons & will lead to a delay in care).   Epic staff messaging available for outptient concerns needing 1-2 business day response.      05/29/2024  9:20 AM

## 2024-05-29 NOTE — Progress Notes (Signed)
 Referring Physician(s): Gross,Steven  Supervising Physician: Susan Ensign  Patient Status:  [name] - In-pt  Chief Complaint: Abdominal pain, postop abdomino-pelvic abscesses;  status post drain placement x 2 on 05/20/2024 and replacement drains on 05/27/24   Subjective: Patient continues to have some right-sided abdominal discomfort, no fever, chills, nausea, vomiting; poor appetite   Allergies: Elemental sulfur  Medications: Prior to Admission medications   Medication Sig Start Date End Date Taking? Authorizing Provider  amLODipine  (NORVASC ) 5 MG tablet Take 1 tablet (5 mg total) by mouth daily. Patient taking differently: Take 5 mg by mouth at bedtime. 04/08/24  Yes Patwardhan, Manish J, MD  ascorbic acid  (VITAMIN C ) 250 MG CHEW Chew 250 mg by mouth daily.   Yes [provider]  aspirin  EC 81 MG tablet Take 81 mg by mouth daily. Swallow whole.   Yes [provider]  cyanocobalamin  (VITAMIN B12) 1000 MCG tablet Take 1,000 mcg by mouth daily.   Yes [provider]  FIBER GUMMIES PO Take 1 capsule by mouth daily.   Yes [provider]  Multiple Vitamin (MULTIVITAMIN WITH MINERALS) TABS tablet Take 1 tablet by mouth daily. 11/10/23  Yes Kraig Peru, MD  Omega-3 Fatty Acids (OMEGA 3 500 PO) Take 500 mg by mouth daily.   Yes [provider]  OVER THE COUNTER MEDICATION Take 650 mg by mouth daily. Total Beets supplement   Yes [provider]  traMADol  (ULTRAM ) 50 MG tablet Take 1-2 tablets (50-100 mg total) by mouth every 6 (six) hours as needed for moderate pain (pain score 4-6) or severe pain (pain score 7-10). 05/08/24  Yes Candyce Champagne, MD     Vital Signs: BP 130/69 (BP Location: Left Arm)   Pulse 97   Temp 98.7 F (37.1 C) (Oral)   Resp 18   Ht 5' 5 (1.651 m)   Wt 134 lb 4.2 oz (60.9 kg)   SpO2 97%   BMI 22.34 kg/m   Physical Exam awake, answering questions okay.  Right lower quadrant drain intact, insertion  site okay, mild to moderately tender to palpation, turbid light yellow fluid in JP bulb, drain flushed without difficulty; left transgluteal drain intact, output appears feculent, drain flushed without difficulty; both drain outputs about 15 cc each  Imaging: CT IMAGE GUIDE DRAIN TRANSVAG TRANSRECT PERITONEAL RETROPER Result Date: 05/28/2024 INDICATION: Numerous small abdominal and pelvic abscesses in a postop patient. Previously 2 drains were placed. One drain demonstrates resolution of the abscess. The other drain has been accidentally removed. The patient needs a new drain placed in the pelvic region where the previous string was accidentally removed and a new drain placed and a maturing midline pelvic abscess. The indwelling catheter will be removed. EXAM: CT-guided drain placement x2 and removal of indwelling drainage catheter TECHNIQUE: Multidetector CT imaging of the pelvis was performed following the standard protocol with/without IV contrast. RADIATION DOSE REDUCTION: This exam was performed according to the departmental dose-optimization program which includes automated exposure control, adjustment of the mA and/or kV according to patient size and/or use of iterative reconstruction technique. MEDICATIONS: The patient is currently admitted to the hospital and receiving intravenous antibiotics. The antibiotics were administered within an appropriate time frame prior to the initiation of the procedure. ANESTHESIA/SEDATION: Moderate (conscious) sedation was employed during this procedure. A total of Versed  2 mg and Fentanyl  100 mcg was administered intravenously by the radiology nurse. Total intra-service moderate Sedation Time: 47 minutes. The patient's level of consciousness and vital signs  were monitored continuously by radiology nursing throughout the procedure under my direct supervision. COMPLICATIONS: None immediate. PROCEDURE: Informed written consent was obtained from the patient after a thorough  discussion of the procedural risks, benefits and alternatives. All questions were addressed. Maximal Sterile Barrier Technique was utilized including caps, mask, sterile gowns, sterile gloves, sterile drape, hand hygiene and skin antiseptic. A timeout was performed prior to the initiation of the procedure. With patient in a supine position on the CT gantry, radiopaque markers were placed on patient's skin. Patient is status and marked, prepped, and draped in usual sterile fashion. A 7 cm Yueh needle was then advanced through a small incision after local anesthesia was achieved with 1% lidocaine . The midline pelvic abscess was then accessed with a Yueh needle as demonstrated with follow-up CT imaging. The needle was removed and a short Amplatz wire was then advanced through the Yueh cannula. The Wendel Hals was removed and the access site was dilated using a 10 [place] dilator. A 10 French pigtail drainage catheter was then advanced over the guidewire coiled in the pelvic abscess. Retention sutures sterile dressing applied. The indwelling midline drainage catheter was then removed by cutting the retention suture and then cutting the catheter and discarded. Slow gentle traction was then applied to the antral portion of the catheter into the catheter was completely removed. Sterile dressing was applied. The patient was then turned to a prone position and radiopaque markers were placed on the patient's skin. Initial axial imaging was obtained in this position. The patient was scanned was then marked, prepped, and draped in usual sterile fashion. Local anesthesia was achieved with 1% lidocaine . A 10 cm Yueh needle was then advanced into the perirectal abscess. Needle position was verified with serial CT imaging until the abscess was accessed. The needle was then removed leaving the cannula. A short Amplatz wire was then advanced and coiled within the abscess. The UB cannula was then retrieved and the access site was dilated  using a 10 French fascial dilator. A 10 French drainage catheter was then advanced over the guidewire coiled within the abscess. Retention suture sterile dressings were applied. Both drainage catheters were connected to a JP bulb. Orders for flushing and care of the drainage catheters placed in the patient's chart. IMPRESSION: Satisfactory removal of the midline anterior abscess drainage catheter and placement of a new midline anterior pelvic drainage catheter as well as a posterior transgluteal drainage catheter in pelvic abscesses. Electronically Signed   By: Susan Ensign   On: 05/28/2024 10:11   CT GUIDED PERITONEAL/RETROPERITONEAL FLUID DRAIN BY PERC CATH Result Date: 05/28/2024 INDICATION: Numerous small abdominal and pelvic abscesses in a postop patient. Previously 2 drains were placed. One drain demonstrates resolution of the abscess. The other drain has been accidentally removed. The patient needs a new drain placed in the pelvic region where the previous string was accidentally removed and a new drain placed and a maturing midline pelvic abscess. The indwelling catheter will be removed. EXAM: CT-guided drain placement x2 and removal of indwelling drainage catheter TECHNIQUE: Multidetector CT imaging of the pelvis was performed following the standard protocol with/without IV contrast. RADIATION DOSE REDUCTION: This exam was performed according to the departmental dose-optimization program which includes automated exposure control, adjustment of the mA and/or kV according to patient size and/or use of iterative reconstruction technique. MEDICATIONS: The patient is currently admitted to the hospital and receiving intravenous antibiotics. The antibiotics were administered within an appropriate time frame prior to the initiation of the  procedure. ANESTHESIA/SEDATION: Moderate (conscious) sedation was employed during this procedure. A total of Versed  2 mg and Fentanyl  100 mcg was administered intravenously  by the radiology nurse. Total intra-service moderate Sedation Time: 47 minutes. The patient's level of consciousness and vital signs were monitored continuously by radiology nursing throughout the procedure under my direct supervision. COMPLICATIONS: None immediate. PROCEDURE: Informed written consent was obtained from the patient after a thorough discussion of the procedural risks, benefits and alternatives. All questions were addressed. Maximal Sterile Barrier Technique was utilized including caps, mask, sterile gowns, sterile gloves, sterile drape, hand hygiene and skin antiseptic. A timeout was performed prior to the initiation of the procedure. With patient in a supine position on the CT gantry, radiopaque markers were placed on patient's skin. Patient is status and marked, prepped, and draped in usual sterile fashion. A 7 cm Yueh needle was then advanced through a small incision after local anesthesia was achieved with 1% lidocaine . The midline pelvic abscess was then accessed with a Yueh needle as demonstrated with follow-up CT imaging. The needle was removed and a short Amplatz wire was then advanced through the Yueh cannula. The Wendel Hals was removed and the access site was dilated using a 10 [place] dilator. A 10 French pigtail drainage catheter was then advanced over the guidewire coiled in the pelvic abscess. Retention sutures sterile dressing applied. The indwelling midline drainage catheter was then removed by cutting the retention suture and then cutting the catheter and discarded. Slow gentle traction was then applied to the antral portion of the catheter into the catheter was completely removed. Sterile dressing was applied. The patient was then turned to a prone position and radiopaque markers were placed on the patient's skin. Initial axial imaging was obtained in this position. The patient was scanned was then marked, prepped, and draped in usual sterile fashion. Local anesthesia was achieved with 1%  lidocaine . A 10 cm Yueh needle was then advanced into the perirectal abscess. Needle position was verified with serial CT imaging until the abscess was accessed. The needle was then removed leaving the cannula. A short Amplatz wire was then advanced and coiled within the abscess. The UB cannula was then retrieved and the access site was dilated using a 10 French fascial dilator. A 10 French drainage catheter was then advanced over the guidewire coiled within the abscess. Retention suture sterile dressings were applied. Both drainage catheters were connected to a JP bulb. Orders for flushing and care of the drainage catheters placed in the patient's chart. IMPRESSION: Satisfactory removal of the midline anterior abscess drainage catheter and placement of a new midline anterior pelvic drainage catheter as well as a posterior transgluteal drainage catheter in pelvic abscesses. Electronically Signed   By: Susan Ensign   On: 05/28/2024 10:11   CT ABDOMEN PELVIS W CONTRAST Result Date: 05/26/2024 CLINICAL DATA:  Abdominal pain, postop EXAM: CT ABDOMEN AND PELVIS WITH CONTRAST TECHNIQUE: Multidetector CT imaging of the abdomen and pelvis was performed using the standard protocol following bolus administration of intravenous contrast. RADIATION DOSE REDUCTION: This exam was performed according to the departmental dose-optimization program which includes automated exposure control, adjustment of the mA and/or kV according to patient size and/or use of iterative reconstruction technique. CONTRAST:  80mL OMNIPAQUE  IOHEXOL  300 MG/ML  SOLN COMPARISON:  None Available. FINDINGS: Lower chest: Small bilateral pleural effusions. Compressive atelectasis in the lower lobes. Hepatobiliary: No focal hepatic abnormality. Gallbladder unremarkable. Pancreas: No focal abnormality or ductal dilatation. Spleen: No focal abnormality.  Normal size.  Adrenals/Urinary Tract: Adrenal glands normal. Punctate left lower pole nephrolithiasis.  No hydronephrosis. Benign appearing right lower pole cyst measures 6 cm. No follow-up imaging recommended. Stomach/Bowel: Postsurgical changes in the rectum. Again noted are probable frank dehiscence changes. Air again noted around the rectum in the perirectal space. Multiple small fluid collections and air collections in the abdomen and pelvis. The largest is noted anteriorly measuring 4 x 2.7 cm on image 47. This is just deep to and superior to and indwelling pigtail drainage catheter. Multiple other smaller fluid collections noted along the descending colon and sigmoid colon in the left lower quadrant and along small bowel loops in the right lower quadrant. No bowel obstruction. Vascular/Lymphatic: Aortic atherosclerosis. No evidence of aneurysm or adenopathy. IVC filter in place. Reproductive: Prior hysterectomy.  No adnexal masses. Other: Trace free fluid in the pelvis. A few locules of extraluminal gas scattered throughout the abdomen and pelvis within the fluid collections described above. Musculoskeletal: No acute bony abnormality. IMPRESSION: Similar appearance at the rectal anastomosis concerning for dehiscence. Interval placement of pigtail drainage catheter anteriorly in the pelvis. There are several small fluid collections throughout the abdomen and pelvis with largest anteriorly in the midline of the pelvis measuring 4 x 2.7 cm. Small bilateral pleural effusions. Compressive atelectasis in the lower lobes. Aortic atherosclerosis. Electronically Signed   By: Janeece Mechanic M.D.   On: 05/26/2024 14:44    Labs:  CBC: Recent Labs    05/24/24 0427 05/25/24 0300 05/26/24 0224 05/29/24 0409  WBC 9.4 8.1 8.3 6.9  HGB 9.3* 8.6* 8.7* 9.0*  HCT 30.3* 28.8* 28.4* 29.7*  PLT 635* 630* 628* 502*    COAGS: Recent Labs    10/05/23 0247 10/11/23 1255 10/12/23 0655 05/19/24 2054  INR 1.1 1.1 1.1 1.2  APTT 30  --   --   --     BMP: Recent Labs    05/24/24 0427 05/25/24 0300 05/26/24 0224  05/28/24 0651 05/29/24 0409  NA 132*  --  135 133* 133*  K 4.0 3.9 3.8 4.0 3.9  CL 101  --  104 101 100  CO2 25  --  24 23 24   GLUCOSE 118*  --  129* 125* 112*  BUN 30*  --  20 19 20   CALCIUM  7.8*  --  7.9* 7.9* 8.1*  CREATININE 0.64 0.62 0.62 0.51 0.84  GFRNONAA >60 >60 >60 >60 >60    LIVER FUNCTION TESTS: Recent Labs    05/22/24 0407 05/24/24 0427 05/26/24 0224 05/29/24 0409  BILITOT 0.8 1.0 0.7 0.7  AST 42* 26 31 18   ALT 50* 39 37 26  ALKPHOS 265* 226* 212* 198*  PROT 6.0* 6.2* 6.0* 6.6  ALBUMIN  <1.5* <1.5* <1.5* 1.6*    Assessment and Plan: 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 in October of last year; status post robotic rectosigmoid resection /takedown of end colostomy with anastomosis /resection of small intestine with anastomosis, small bowel repair on 05/08/2024; now with postop abdomino-pelvic abscesses, status post drain placement x 2 on 05/20/2024 followed by drain replacements on 05/27/2024; afebrile, WBC normal, hemoglobin 9, creatinine normal; most recent cultures with rare Pseudomonas  Drain Location: RLQ/left TG Size: Fr size: 10 Fr Date of placement: 05/27/24  Currently to: Drain collection device: suction bulb 24 hour output:  Output by Drain (mL) 05/27/24 0701 - 05/27/24 1900 05/27/24 1901 - 05/28/24 0700 05/28/24 0701 - 05/28/24 1900 05/28/24 1901 - 05/29/24 0700 05/29/24  0701 - 05/29/24 1404  Closed System Drain 1 Inferior;Midline Abdomen Bulb (JP) 10 Fr. 20 55 5 5 5   Closed System Drain 2 Left Buttock Bulb (JP) 10 Fr. 40 60 5 5 5       Current examination: Flushes/aspirates easily.  Insertion site unremarkable. Suture  in place. Dressed appropriately.   Plan: Continue TID flushes with 5 cc NS. Record output Q shift. Dressing changes QD or PRN if soiled.  Call IR APP or on call IR MD if difficulty flushing or sudden change in drain output.  Repeat  imaging/possible drain injection once output < 10 mL/QD (excluding flush material). Consideration for drain removal if output is < 10 mL/QD (excluding flush material), pending discussion with the providing surgical service.  Discharge planning: Please contact IR APP or on call IR MD prior to patient d/c to ensure appropriate follow up plans are in place. Typically patient will follow up with IR clinic 10-14 days post d/c for repeat imaging/possible drain injection. IR scheduler will contact patient with date/time of appointment. Patient will need to flush drain QD with 5 cc NS, record output QD, dressing changes every 2-3 days or earlier if soiled.   IR will continue to follow - please call with questions or concerns.      Electronically Signed: D. Honore Lux, PA-C 05/29/2024, 1:59 PM   I spent a total of 15 Minutes at the the patient's bedside AND on the patient's hospital floor or unit, greater than 50% of which was counseling/coordinating care for abdominal/ pelvic abscess drains    Patient ID: Elizabeth Odom, Elizabeth Odom   DOB: 10-17-1941, 83 y.o.   MRN: [id number]

## 2024-05-29 NOTE — Consult Note (Signed)
 [facility]    Date of Admission:  05/08/2024   Total days of inpatient antibiotics 17        Reason for Consult: Intra-abdominal infection    Principal Problem:   Diverticular disease Active Problems:   A-fib (HCC)   Restless leg   ABLA (acute blood loss anemia)   Need for emotional support   Acute urinary retention   Mixed hyperlipidemia   Essential hypertension   Diverticular disease of left colon   History of DVT (deep vein thrombosis)   History of pulmonary embolus (PE)   Pre-diabetes   Presence of IVC filter   Stricture of sigmoid colon (HCC)   Protein-calorie malnutrition, severe   Hypokalemia   Assessment:  83 year old female with past medical history of large bowel resection status post Hartmann resection and colostomy creation in October 2020 for complicated, anastomotic leak, A-fib not on anticoagulation, cervical cancer, syncopal episode admitted for colostomy takedown found to have: #Post op intra-abdominal abscesses, 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 Hershell Lose [facility] course was complicated by paroxysmal A-fib, acute blood loss requiring packed red blood cells.  Found to have postop intra-abdominal abscesses as well.  She initially had percutaneous drain placement with IR on 6/10, no cultures obtained.  Cultures from drain were polymicrobial on 6/12 growing Pseudomonas aeruginosa, MRSA, Candida albicans, E faecalis.  Patient has been on pip-tazo, micafungin, vancomycin . - She underwent a second set of drain placement on 6/17 as transgluteal drain became dislodged.  Cultures obtained from that day.  ID engaged for antibiotic recommendations.  Recommendations:  -Continue vancomycin , pip-tazo and micafungin - Discussed with IR noted during aspiration the pus was from the  anterior pelvic drain. the transgluteal pretty much has stool coming from it.  Mirman concern for enterocutaneous fistula since there is stool coming from the transgluteal drain. Consider drain study.  Will continue IV antibiotics for now.  Await cultures.  Of note cultures from 6/12 were from drain not at time of aspiration as such clinical significance is unclear. -  Contact precautions   Evaluation of this patient requires complex antimicrobial therapy evaluation and counseling + isolation needs for disease transmission risk assessment and mitigation   Microbiology:   Antibiotics: Cefotan  10//20 Erythromycin  05/09/1930 Pip-tazo 6/9-present Vancomycin  6/12-present Micafungin 6/13-present  Cultures:  Blood  Urine  Other 6/17 pending 6/12 Pseudomonas aeruginosa, MRSA, Candida albicans, E faecalis  HPI: Elizabeth Odom is a 83 y.o. female large bowel obstruction in October status post Hartman resection and colostomy creation complicated by anastomotic leak, A-fib not on anticoagulation, hypertension, cervical cancer syncopal episode requiring loop recorder admitted for colostomy takedown. Hospital stay complicated by persistent A-fib, ABL, and abdominal abscesses.  She underwent drain placement on 6/10.  Cultures from drain on 6/12 grew polymicrobial organism.  One of her drains dislodged.  She underwent another set of drain placement on 6/17 with culture sent.  ID engaged for antibiotic recommendationsn   Review of Systems: Review of Systems  All other systems reviewed and are negative.   Past Medical History:  Diagnosis Date   A-fib (HCC) 04/09/2016   Acute head injury 04/09/2016   Anemia    Blood dyscrasia    Hx DVT / PE   Cervical cancer (HCC)    Cervical spine fracture, initial encounter 04/09/2016   Claustrophobia  extremely claustrophobic   Dupuytren disease of palm of both hands    Essential hypertension    Fatty liver    History of kidney stones    MCI (mild  cognitive impairment) with memory loss    Multiple fractures of cervical spine (HCC) 04/09/2016   Restless leg    Syncope and collapse 04/09/2016    Social History   Tobacco Use   Smoking status: Never   Smokeless tobacco: Never  Vaping Use   Vaping status: Never Used  Substance Use Topics   Alcohol  use: Never    Alcohol /week: 0.0 standard drinks of alcohol    Drug use: Never    Family History  Problem Relation Age of Onset   Other Mother        thrombosis   Cancer Sister    Diabetes Sister    Stroke Daughter    Thyroid  disease Daughter    Scheduled Meds:  acetaminophen   500 mg Oral TID WC & HS   ascorbic acid   250 mg Oral Daily   Chlorhexidine  Gluconate Cloth  6 each Topical Daily   cyanocobalamin   1,000 mcg Oral Daily   feeding supplement  1 Container Oral TID BM   liver oil-zinc  oxide   Topical BID   loperamide   4 mg Oral TID   metoprolol  succinate  12.5 mg Oral Daily   multivitamin with minerals  1 tablet Oral Daily   mupirocin  ointment  1 Application Nasal BID   pantoprazole   40 mg Oral Daily   psyllium  1 packet Oral BID   sodium chloride  flush  10-40 mL Intracatheter Q12H   sodium chloride  flush  3 mL Intravenous Q12H   sodium chloride  flush  5 mL Intracatheter Q8H   tamsulosin   0.4 mg Oral QPC supper   Continuous Infusions:  sodium chloride  10 mL/hr at 05/26/24 2028   micafungin (MYCAMINE) 100 mg in sodium chloride  0.9 % 100 mL IVPB 100 mg (05/28/24 1215)   piperacillin -tazobactam (ZOSYN )  IV 3.375 g (05/29/24 0512)   TPN ADULT (ION) 40 mL/hr at 05/28/24 1731   vancomycin  750 mg (05/28/24 2134)   PRN Meds:.sodium chloride , alum & mag hydroxide-simeth, cyclobenzaprine, diphenhydrAMINE  **OR** [DISCONTINUED] diphenhydrAMINE , diphenoxylate-atropine, fentaNYL  (SUBLIMAZE ) injection, hydrALAZINE , lip balm, menthol -cetylpyridinium, naphazoline-glycerin , [DISCONTINUED] ondansetron  **OR** ondansetron  (ZOFRAN ) IV, mouth rinse, phenol, [DISCONTINUED] prochlorperazine   **OR** prochlorperazine , simethicone , sodium chloride , sodium chloride  flush, sodium chloride  flush, traMADol , witch hazel-glycerin  Allergies  Allergen Reactions   Elemental Sulfur Anaphylaxis    OBJECTIVE: Blood pressure (!) 129/56, pulse (!) 103, temperature 98.5 F (36.9 C), resp. rate 15, height 5' 5 (1.651 m), weight 60.9 kg, SpO2 96%.  Physical Exam Constitutional:      Appearance: Normal appearance.  HENT:     Head: Normocephalic and atraumatic.     Right Ear: Tympanic membrane normal.     Left Ear: Tympanic membrane normal.     Nose: Nose normal.     Mouth/Throat:     Mouth: Mucous membranes are moist.   Eyes:     Extraocular Movements: Extraocular movements intact.     Conjunctiva/sclera: Conjunctivae normal.     Pupils: Pupils are equal, round, and reactive to light.    Cardiovascular:     Rate and Rhythm: Normal rate and regular rhythm.     Heart sounds: No murmur heard.    No friction rub. No gallop.  Pulmonary:     Effort: Pulmonary effort is normal.     Breath sounds: Normal breath sounds.  Abdominal:  Comments: Drain   Musculoskeletal:        General: Normal range of motion.   Skin:    General: Skin is warm and dry.   Neurological:     General: No focal deficit present.     Mental Status: She is alert and oriented to person, place, and time.   Psychiatric:        Mood and Affect: Mood normal.     Lab Results Lab Results  Component Value Date   WBC 6.9 05/29/2024   HGB 9.0 (L) 05/29/2024   HCT 29.7 (L) 05/29/2024   MCV 93.7 05/29/2024   PLT 502 (H) 05/29/2024    Lab Results  Component Value Date   CREATININE 0.84 05/29/2024   BUN 20 05/29/2024   NA 133 (L) 05/29/2024   K 3.9 05/29/2024   CL 100 05/29/2024   CO2 24 05/29/2024    Lab Results  Component Value Date   ALT 26 05/29/2024   AST 18 05/29/2024   ALKPHOS 198 (H) 05/29/2024   BILITOT 0.7 05/29/2024       Orlie Bjornstad, MD [facility] 05/29/2024, 5:57 AM

## 2024-05-29 NOTE — Plan of Care (Signed)
  Problem: Respiratory: Goal: Respiratory status will improve Outcome: Progressing   Problem: Clinical Measurements: Goal: Respiratory complications will improve Outcome: Progressing   Problem: Coping: Goal: Level of anxiety will decrease Outcome: Progressing   Problem: Elimination: Goal: Will not experience complications related to bowel motility Outcome: Progressing   Problem: Pain Managment: Goal: General experience of comfort will improve and/or be controlled Outcome: Progressing   Problem: Safety: Goal: Ability to remain free from injury will improve Outcome: Progressing

## 2024-05-29 NOTE — Progress Notes (Signed)
 PROGRESS NOTE    Elizabeth Odom  MVH:[id number] DOB: [birth year] DOA: 05/08/2024 PCP: Anthon Kins, MD   Brief Narrative: Elizabeth Odom is a 83 y.o. female with a history of atrial fibrillation, DVT, PE, anemia, PACs, hypertension, mild cognitive impairment, large bowel obstruction.  Patient initially presented for surgical management involving history of diverticulitis with planned ostomy takedown with anastomosis. During hospitalization, patient developed altered mental status secondary to polypharmacy in addition to atrial fibrillation with RVR. Cardiology consulted for recommendation and management with subsequent improvement in RVR. Ongoing care by general surgery.   Assessment and Plan:  Persistent atrial fibrillation/flutter Cardiology consulted for management.  Medication adjusted to metoprolol  XL 25 mg daily with recommendation for Eliquis  when okay per surgery. Toprol -XL decreased to 12.5 mg daily -Continue Toprol -XL 12.5 mg daily -Restart Eliquis   History of diverticulitis with perforation and abscess with colostomy status post ostomy takedown Ileus Per primary.  Acute blood loss anemia Secondary to surgery.  Hemoglobin down to a low of 6.1 this admission.  Patient received a total of 4 units of PRBC via transfusion this admission.  Hemoglobin currently stable.  AKI Mild. Resolved.  Hypoalbuminemia Noted. Associated poor nutrition.  Leukocytosis In setting of surgery. Resolved.  Thrombocytosis Noted. Likely reactive. Mildly elevated and stable.  Hyponatremia Mild.  Syncope Syncopal episode occurred during hospitalization while patient was on commode.  Concern for possible vasovagal versus hypotension etiology.  Transthoracic echo obtained with evidence of preserved LVEF of 60 to 65% in addition to no aortic stenosis.  Acute urinary retention Foley inserted on 6/4. Flomax  started. Foley removed on 6/13. -Continue Flomax   Acute metabolic  encephalopathy Delirium Resolved.   DVT prophylaxis: Per primary Code Status:   Code Status: Full Code Family Communication: None at bedside Disposition Plan: Per primary   Consultants:    Procedures:    Antimicrobials:     Subjective: No issues noted from overnight events.  Objective: BP (!) 129/56 (BP Location: Left Arm)   Pulse (!) 103   Temp 98.5 F (36.9 C)   Resp 15   Ht 5' 5 (1.651 m)   Wt 60.9 kg   SpO2 96%   BMI 22.34 kg/m   Examination:  General exam: Appears calm and comfortable   Data Reviewed: I have personally reviewed following labs and imaging studies  CBC Lab Results  Component Value Date   WBC 6.9 05/29/2024   RBC 3.17 (L) 05/29/2024   HGB 9.0 (L) 05/29/2024   HCT 29.7 (L) 05/29/2024   MCV 93.7 05/29/2024   MCH 28.4 05/29/2024   PLT 502 (H) 05/29/2024   MCHC 30.3 05/29/2024   RDW 16.1 (H) 05/29/2024   LYMPHSABS 0.7 05/09/2024   MONOABS 1.8 (H) 05/09/2024   EOSABS 0.0 05/09/2024   BASOSABS 0.0 05/09/2024     Last metabolic panel Lab Results  Component Value Date   NA 133 (L) 05/29/2024   K 3.9 05/29/2024   CL 100 05/29/2024   CO2 24 05/29/2024   BUN 20 05/29/2024   CREATININE 0.84 05/29/2024   GLUCOSE 112 (H) 05/29/2024   GFRNONAA >60 05/29/2024   GFRAA >60 04/10/2016   CALCIUM  8.1 (L) 05/29/2024   PHOS 3.5 05/29/2024   PROT 6.6 05/29/2024   ALBUMIN  1.6 (L) 05/29/2024   LABGLOB 2.9 04/08/2024   BILITOT 0.7 05/29/2024   ALKPHOS 198 (H) 05/29/2024   AST 18 05/29/2024   ALT 26 05/29/2024   ANIONGAP 9 05/29/2024    GFR: Estimated Creatinine Clearance: 45.7  mL/min (by C-G formula based on SCr of 0.84 mg/dL).  Recent Results (from the past 240 hours)  Aerobic/Anaerobic Culture w Gram Stain (surgical/deep wound)     Status: None (Preliminary result)   Collection Time: 05/22/24  3:59 PM   Specimen: Abdomen  Result Value Ref Range Status   Specimen Description   Final    ABDOMEN Performed at [facility], [address], [place], [place] [address]    Special Requests   Final    ABDOMINAL DRAIN Performed at [facility], [address] [place], [place] [address]    Gram Stain   Final    ABUNDANT WBC PRESENT, PREDOMINANTLY PMN RARE GRAM POSITIVE COCCI RARE YEAST WITH PSEUDOHYPHAE    Culture   Final    MODERATE PSEUDOMONAS AERUGINOSA MODERATE STAPHYLOCOCCUS AUREUS SUSCEPTIBILITIES PERFORMED ON PREVIOUS CULTURE WITHIN THE LAST 5 DAYS. MODERATE CANDIDA ALBICANS Sent to [facility] for further susceptibility testing. NO ANAEROBES ISOLATED Performed at [facility], [address], [place], [place] [address]    Report Status PENDING  Incomplete   Organism ID, Bacteria PSEUDOMONAS AERUGINOSA  Final      Susceptibility   Pseudomonas aeruginosa - MIC*    CEFTAZIDIME 4 SENSITIVE Sensitive     CIPROFLOXACIN <=0.25 SENSITIVE Sensitive     GENTAMICIN 4 SENSITIVE Sensitive     IMIPENEM 2 SENSITIVE Sensitive     PIP/TAZO <=4 SENSITIVE Sensitive ug/mL    CEFEPIME 2 SENSITIVE Sensitive     * MODERATE PSEUDOMONAS AERUGINOSA  Aerobic/Anaerobic Culture w Gram Stain (surgical/deep wound)     Status: None   Collection Time: 05/22/24  3:59 PM   Specimen: Back  Result Value Ref Range Status   Specimen Description   Final    BACK Performed at [facility], [address] [place], [place] [address]    Special Requests   Final    TRANSGLUTEAL PERCUTANEOUS IR DRAIN Performed at [facility], [address], [place], [place] [address]    Gram Stain   Final    NO WBC SEEN ABUNDANT GRAM NEGATIVE RODS RARE GRAM POSITIVE COCCI IN PAIRS RARE BUDDING YEAST SEEN    Culture   Final    ABUNDANT PSEUDOMONAS AERUGINOSA ABUNDANT METHICILLIN RESISTANT STAPHYLOCOCCUS AUREUS ABUNDANT ENTEROCOCCUS FAECALIS ABUNDANT CANDIDA ALBICANS NO ANAEROBES ISOLATED Performed at [facility], [address] [place], [place] [address]     Report Status 05/27/2024 FINAL  Final   Organism ID, Bacteria PSEUDOMONAS AERUGINOSA  Final   Organism ID, Bacteria METHICILLIN RESISTANT STAPHYLOCOCCUS AUREUS  Final   Organism ID, Bacteria ENTEROCOCCUS FAECALIS  Final      Susceptibility   Enterococcus faecalis - MIC*    AMPICILLIN <=2 SENSITIVE Sensitive     VANCOMYCIN  1 SENSITIVE Sensitive     GENTAMICIN SYNERGY SENSITIVE Sensitive     * ABUNDANT ENTEROCOCCUS FAECALIS   Methicillin resistant staphylococcus aureus - MIC*    CIPROFLOXACIN >=8 RESISTANT Resistant     ERYTHROMYCIN  >=8 RESISTANT Resistant     GENTAMICIN <=0.5 SENSITIVE Sensitive     OXACILLIN >=4 RESISTANT Resistant     TETRACYCLINE <=1 SENSITIVE Sensitive     VANCOMYCIN  1 SENSITIVE Sensitive     TRIMETH/SULFA >=320 RESISTANT Resistant     CLINDAMYCIN <=0.25 SENSITIVE Sensitive     RIFAMPIN <=0.5 SENSITIVE Sensitive     Inducible Clindamycin NEGATIVE Sensitive     LINEZOLID 2 SENSITIVE Sensitive     * ABUNDANT METHICILLIN RESISTANT  STAPHYLOCOCCUS AUREUS   Pseudomonas aeruginosa - MIC*    CEFTAZIDIME 4 SENSITIVE Sensitive     CIPROFLOXACIN <=0.25 SENSITIVE Sensitive     GENTAMICIN 4 SENSITIVE Sensitive     IMIPENEM 2 SENSITIVE Sensitive     PIP/TAZO <=4 SENSITIVE Sensitive ug/mL    CEFEPIME 2 SENSITIVE Sensitive     * ABUNDANT PSEUDOMONAS AERUGINOSA  Aerobic/Anaerobic Culture w Gram Stain (surgical/deep wound)     Status: None (Preliminary result)   Collection Time: 05/27/24  5:19 PM   Specimen: Abscess  Result Value Ref Range Status   Specimen Description   Final    ABSCESS ABDOMEN Performed at [facility], [address], [place], [place] [address]    Special Requests   Final    NONE Performed at [facility], [address] [place], [place] [address]    Gram Stain   Final    ABUNDANT WBC PRESENT, PREDOMINANTLY PMN NO ORGANISMS SEEN Performed at [facility], [address], [place], [place] [address]     Culture   Final    RARE PSEUDOMONAS AERUGINOSA SUSCEPTIBILITIES TO FOLLOW NO ANAEROBES ISOLATED; CULTURE IN PROGRESS FOR 5 DAYS    Report Status PENDING  Incomplete      Radiology Studies: CT IMAGE GUIDE DRAIN TRANSVAG TRANSRECT PERITONEAL RETROPER Result Date: 05/28/2024 INDICATION: Numerous small abdominal and pelvic abscesses in a postop patient. Previously 2 drains were placed. One drain demonstrates resolution of the abscess. The other drain has been accidentally removed. The patient needs a new drain placed in the pelvic region where the previous string was accidentally removed and a new drain placed and a maturing midline pelvic abscess. The indwelling catheter will be removed. EXAM: CT-guided drain placement x2 and removal of indwelling drainage catheter TECHNIQUE: Multidetector CT imaging of the pelvis was performed following the standard protocol with/without IV contrast. RADIATION DOSE REDUCTION: This exam was performed according to the departmental dose-optimization program which includes automated exposure control, adjustment of the mA and/or kV according to patient size and/or use of iterative reconstruction technique. MEDICATIONS: The patient is currently admitted to the hospital and receiving intravenous antibiotics. The antibiotics were administered within an appropriate time frame prior to the initiation of the procedure. ANESTHESIA/SEDATION: Moderate (conscious) sedation was employed during this procedure. A total of Versed  2 mg and Fentanyl  100 mcg was administered intravenously by the radiology nurse. Total intra-service moderate Sedation Time: 47 minutes. The patient's level of consciousness and vital signs were monitored continuously by radiology nursing throughout the procedure under my direct supervision. COMPLICATIONS: None immediate. PROCEDURE: Informed written consent was obtained from the patient after a thorough discussion of the procedural risks, benefits and alternatives.  All questions were addressed. Maximal Sterile Barrier Technique was utilized including caps, mask, sterile gowns, sterile gloves, sterile drape, hand hygiene and skin antiseptic. A timeout was performed prior to the initiation of the procedure. With patient in a supine position on the CT gantry, radiopaque markers were placed on patient's skin. Patient is status and marked, prepped, and draped in usual sterile fashion. A 7 cm Yueh needle was then advanced through a small incision after local anesthesia was achieved with 1% lidocaine . The midline pelvic abscess was then accessed with a Yueh needle as demonstrated with follow-up CT imaging. The needle was removed and a short Amplatz wire was then advanced through the Yueh cannula. The Wendel Hals was removed and the access site was dilated using a 10 [place] dilator. A 10 French pigtail  drainage catheter was then advanced over the guidewire coiled in the pelvic abscess. Retention sutures sterile dressing applied. The indwelling midline drainage catheter was then removed by cutting the retention suture and then cutting the catheter and discarded. Slow gentle traction was then applied to the antral portion of the catheter into the catheter was completely removed. Sterile dressing was applied. The patient was then turned to a prone position and radiopaque markers were placed on the patient's skin. Initial axial imaging was obtained in this position. The patient was scanned was then marked, prepped, and draped in usual sterile fashion. Local anesthesia was achieved with 1% lidocaine . A 10 cm Yueh needle was then advanced into the perirectal abscess. Needle position was verified with serial CT imaging until the abscess was accessed. The needle was then removed leaving the cannula. A short Amplatz wire was then advanced and coiled within the abscess. The UB cannula was then retrieved and the access site was dilated using a 10 French fascial dilator. A 10 French drainage catheter  was then advanced over the guidewire coiled within the abscess. Retention suture sterile dressings were applied. Both drainage catheters were connected to a JP bulb. Orders for flushing and care of the drainage catheters placed in the patient's chart. IMPRESSION: Satisfactory removal of the midline anterior abscess drainage catheter and placement of a new midline anterior pelvic drainage catheter as well as a posterior transgluteal drainage catheter in pelvic abscesses. Electronically Signed   By: Susan Ensign   On: 05/28/2024 10:11   CT GUIDED PERITONEAL/RETROPERITONEAL FLUID DRAIN BY PERC CATH Result Date: 05/28/2024 INDICATION: Numerous small abdominal and pelvic abscesses in a postop patient. Previously 2 drains were placed. One drain demonstrates resolution of the abscess. The other drain has been accidentally removed. The patient needs a new drain placed in the pelvic region where the previous string was accidentally removed and a new drain placed and a maturing midline pelvic abscess. The indwelling catheter will be removed. EXAM: CT-guided drain placement x2 and removal of indwelling drainage catheter TECHNIQUE: Multidetector CT imaging of the pelvis was performed following the standard protocol with/without IV contrast. RADIATION DOSE REDUCTION: This exam was performed according to the departmental dose-optimization program which includes automated exposure control, adjustment of the mA and/or kV according to patient size and/or use of iterative reconstruction technique. MEDICATIONS: The patient is currently admitted to the hospital and receiving intravenous antibiotics. The antibiotics were administered within an appropriate time frame prior to the initiation of the procedure. ANESTHESIA/SEDATION: Moderate (conscious) sedation was employed during this procedure. A total of Versed  2 mg and Fentanyl  100 mcg was administered intravenously by the radiology nurse. Total intra-service moderate Sedation  Time: 47 minutes. The patient's level of consciousness and vital signs were monitored continuously by radiology nursing throughout the procedure under my direct supervision. COMPLICATIONS: None immediate. PROCEDURE: Informed written consent was obtained from the patient after a thorough discussion of the procedural risks, benefits and alternatives. All questions were addressed. Maximal Sterile Barrier Technique was utilized including caps, mask, sterile gowns, sterile gloves, sterile drape, hand hygiene and skin antiseptic. A timeout was performed prior to the initiation of the procedure. With patient in a supine position on the CT gantry, radiopaque markers were placed on patient's skin. Patient is status and marked, prepped, and draped in usual sterile fashion. A 7 cm Yueh needle was then advanced through a small incision after local anesthesia was achieved with 1% lidocaine . The midline pelvic abscess was then accessed with a Wendel Hals  needle as demonstrated with follow-up CT imaging. The needle was removed and a short Amplatz wire was then advanced through the Yueh cannula. The Wendel Hals was removed and the access site was dilated using a 10 [place] dilator. A 10 French pigtail drainage catheter was then advanced over the guidewire coiled in the pelvic abscess. Retention sutures sterile dressing applied. The indwelling midline drainage catheter was then removed by cutting the retention suture and then cutting the catheter and discarded. Slow gentle traction was then applied to the antral portion of the catheter into the catheter was completely removed. Sterile dressing was applied. The patient was then turned to a prone position and radiopaque markers were placed on the patient's skin. Initial axial imaging was obtained in this position. The patient was scanned was then marked, prepped, and draped in usual sterile fashion. Local anesthesia was achieved with 1% lidocaine . A 10 cm Yueh needle was then advanced into the  perirectal abscess. Needle position was verified with serial CT imaging until the abscess was accessed. The needle was then removed leaving the cannula. A short Amplatz wire was then advanced and coiled within the abscess. The UB cannula was then retrieved and the access site was dilated using a 10 French fascial dilator. A 10 French drainage catheter was then advanced over the guidewire coiled within the abscess. Retention suture sterile dressings were applied. Both drainage catheters were connected to a JP bulb. Orders for flushing and care of the drainage catheters placed in the patient's chart. IMPRESSION: Satisfactory removal of the midline anterior abscess drainage catheter and placement of a new midline anterior pelvic drainage catheter as well as a posterior transgluteal drainage catheter in pelvic abscesses. Electronically Signed   By: Susan Ensign   On: 05/28/2024 10:11      LOS: 21 days    Aneita Keens, MD [facility] 05/29/2024, 1:06 PM   If 7PM-7AM, please contact night-coverage [email or web address]

## 2024-05-30 DIAGNOSIS — K651 Peritoneal abscess: Secondary | ICD-10-CM | POA: Diagnosis not present

## 2024-05-30 DIAGNOSIS — B9562 Methicillin resistant Staphylococcus aureus infection as the cause of diseases classified elsewhere: Secondary | ICD-10-CM | POA: Diagnosis not present

## 2024-05-30 DIAGNOSIS — B952 Enterococcus as the cause of diseases classified elsewhere: Secondary | ICD-10-CM | POA: Diagnosis not present

## 2024-05-30 DIAGNOSIS — K5790 Diverticulosis of intestine, part unspecified, without perforation or abscess without bleeding: Secondary | ICD-10-CM | POA: Diagnosis not present

## 2024-05-30 DIAGNOSIS — B965 Pseudomonas (aeruginosa) (mallei) (pseudomallei) as the cause of diseases classified elsewhere: Secondary | ICD-10-CM | POA: Diagnosis not present

## 2024-05-30 LAB — AEROBIC/ANAEROBIC CULTURE W GRAM STAIN (SURGICAL/DEEP WOUND)

## 2024-05-30 LAB — CK: Total CK: 13 U/L — ABNORMAL LOW (ref 38–234)

## 2024-05-30 MED ORDER — TRAVASOL 10 % IV SOLN
INTRAVENOUS | Status: AC
  Filled 2024-05-30: qty 537.6

## 2024-05-30 MED ORDER — CALCIUM POLYCARBOPHIL 625 MG PO TABS
625.0000 mg | ORAL_TABLET | Freq: Two times a day (BID) | ORAL | Status: DC
  Administered 2024-05-30 – 2024-06-02 (×7): 625 mg via ORAL
  Filled 2024-05-30 (×9): qty 1

## 2024-05-30 NOTE — Progress Notes (Signed)
 PROGRESS NOTE    Elizabeth Odom  ZOX:[id number] DOB: [birth year] DOA: 05/08/2024 PCP: Anthon Kins, MD   Brief Narrative: Elizabeth Odom is a 83 y.o. female with a history of atrial fibrillation, DVT, PE, anemia, PACs, hypertension, mild cognitive impairment, large bowel obstruction.  Patient initially presented for surgical management involving history of diverticulitis with planned ostomy takedown with anastomosis. During hospitalization, patient developed altered mental status secondary to polypharmacy in addition to atrial fibrillation with RVR. Cardiology consulted for recommendation and management with subsequent improvement in RVR. Ongoing care by general surgery.   Assessment and Plan:  Persistent atrial fibrillation/flutter Cardiology consulted for management.  Medication adjusted to metoprolol  XL 25 mg daily with recommendation for Eliquis  when okay per surgery. Toprol -XL decreased to 12.5 mg daily -Continue Toprol -XL 12.5 mg daily - Continue Eliquis   History of diverticulitis with perforation and abscess with colostomy status post ostomy takedown Ileus Per primary.  Infectious diseases consulted and are planning for management with multi week course of antibiotics.  Urine culture (6/12) significant for Pseudomonas, MRSA, Enterococcus faecalis, Candida albicans.  Drain culture (6/17) significant for rare Pseudomonas aeruginosa.  Ongoing antibiotics per infectious disease.  Patient now on daptomycin , micafungin, Zosyn   Acute blood loss anemia Secondary to surgery.  Hemoglobin down to a low of 6.1 this admission.  Patient received a total of 4 units of PRBC via transfusion this admission.  Hemoglobin currently stable.  AKI Mild. Resolved.  Hypoalbuminemia Noted. Associated poor nutrition.  Leukocytosis In setting of surgery. Resolved.  Thrombocytosis Noted. Likely reactive. Mildly elevated and stable.  Hyponatremia Mild.  Syncope Syncopal episode occurred during  hospitalization while patient was on commode.  Concern for possible vasovagal versus hypotension etiology.  Transthoracic echo obtained with evidence of preserved LVEF of 60 to 65% in addition to no aortic stenosis.  Acute urinary retention Foley inserted on 6/4. Flomax  started. Foley removed on 6/13. -Continue Flomax   Acute metabolic encephalopathy Delirium Resolved.  Generalized weakness Physical therapy is ordered already.  Discussed with patient that is important for her to participate as much as possible with therapy to improve her strength.   DVT prophylaxis: Per primary Code Status:   Code Status: Full Code Family Communication: None at bedside Disposition Plan: Per primary   Consultants:    Procedures:    Antimicrobials:     Subjective: Patient reports wanting to get up today.  Objective: BP (!) 111/47 (BP Location: Left Arm)   Pulse 91   Temp 98.3 F (36.8 C) (Oral)   Resp 15   Ht 5' 5 (1.651 m)   Wt 60.9 kg   SpO2 94%   BMI 22.34 kg/m   Examination:  General exam: Appears calm and comfortable Respiratory system: Clear to auscultation. Respiratory effort normal. Cardiovascular system: S1 & S2 heard, regular rhythm with normal rate. Gastrointestinal system: Abdomen is distended, soft and nontender. Normal bowel sounds heard. Central nervous system: Alert and oriented. No focal neurological deficits. Psychiatry: Judgement and insight appear normal. Mood & affect appropriate.    Data Reviewed: I have personally reviewed following labs and imaging studies  CBC Lab Results  Component Value Date   WBC 6.9 05/29/2024   RBC 3.17 (L) 05/29/2024   HGB 9.0 (L) 05/29/2024   HCT 29.7 (L) 05/29/2024   MCV 93.7 05/29/2024   MCH 28.4 05/29/2024   PLT 502 (H) 05/29/2024   MCHC 30.3 05/29/2024   RDW 16.1 (H) 05/29/2024   LYMPHSABS 0.7 05/09/2024   MONOABS 1.8 (H)  05/09/2024   EOSABS 0.0 05/09/2024   BASOSABS 0.0 05/09/2024     Last metabolic  panel Lab Results  Component Value Date   NA 133 (L) 05/29/2024   K 3.9 05/29/2024   CL 100 05/29/2024   CO2 24 05/29/2024   BUN 20 05/29/2024   CREATININE 0.84 05/29/2024   GLUCOSE 112 (H) 05/29/2024   GFRNONAA >60 05/29/2024   GFRAA >60 04/10/2016   CALCIUM  8.1 (L) 05/29/2024   PHOS 3.5 05/29/2024   PROT 6.6 05/29/2024   ALBUMIN  1.6 (L) 05/29/2024   LABGLOB 2.9 04/08/2024   BILITOT 0.7 05/29/2024   ALKPHOS 198 (H) 05/29/2024   AST 18 05/29/2024   ALT 26 05/29/2024   ANIONGAP 9 05/29/2024    GFR: Estimated Creatinine Clearance: 45.7 mL/min (by C-G formula based on SCr of 0.84 mg/dL).  Recent Results (from the past 240 hours)  Aerobic/Anaerobic Culture w Gram Stain (surgical/deep wound)     Status: None (Preliminary result)   Collection Time: 05/22/24  3:59 PM   Specimen: Abdomen  Result Value Ref Range Status   Specimen Description   Final    ABDOMEN Performed at [facility], [address], [place], [place] [address]    Special Requests   Final    ABDOMINAL DRAIN Performed at [facility], [address], [place], [place] [address]    Gram Stain   Final    ABUNDANT WBC PRESENT, PREDOMINANTLY PMN RARE GRAM POSITIVE COCCI RARE YEAST WITH PSEUDOHYPHAE    Culture   Final    MODERATE PSEUDOMONAS AERUGINOSA MODERATE STAPHYLOCOCCUS AUREUS SUSCEPTIBILITIES PERFORMED ON PREVIOUS CULTURE WITHIN THE LAST 5 DAYS. MODERATE CANDIDA ALBICANS Sent to [facility] for further susceptibility testing. NO ANAEROBES ISOLATED Performed at [facility], [address], [place], [place] [address]    Report Status PENDING  Incomplete   Organism ID, Bacteria PSEUDOMONAS AERUGINOSA  Final      Susceptibility   Pseudomonas aeruginosa - MIC*    CEFTAZIDIME 4 SENSITIVE Sensitive     CIPROFLOXACIN <=0.25 SENSITIVE Sensitive     GENTAMICIN 4 SENSITIVE Sensitive     IMIPENEM 2 SENSITIVE Sensitive     PIP/TAZO <=4 SENSITIVE Sensitive ug/mL     CEFEPIME 2 SENSITIVE Sensitive     * MODERATE PSEUDOMONAS AERUGINOSA  Aerobic/Anaerobic Culture w Gram Stain (surgical/deep wound)     Status: None   Collection Time: 05/22/24  3:59 PM   Specimen: Back  Result Value Ref Range Status   Specimen Description   Final    BACK Performed at [facility], [address] [place], [place] [address]    Special Requests   Final    TRANSGLUTEAL PERCUTANEOUS IR DRAIN Performed at [facility], [address] [place], [place] [address]    Gram Stain   Final    NO WBC SEEN ABUNDANT GRAM NEGATIVE RODS RARE GRAM POSITIVE COCCI IN PAIRS RARE BUDDING YEAST SEEN    Culture   Final    ABUNDANT PSEUDOMONAS AERUGINOSA ABUNDANT METHICILLIN RESISTANT STAPHYLOCOCCUS AUREUS ABUNDANT ENTEROCOCCUS FAECALIS ABUNDANT CANDIDA ALBICANS NO ANAEROBES ISOLATED Sent to [facility] for further susceptibility testing. Performed at [facility], [address] [place], [place] [address]    Report Status 05/27/2024 FINAL  Final   Organism ID, Bacteria PSEUDOMONAS AERUGINOSA  Final   Organism ID, Bacteria METHICILLIN RESISTANT STAPHYLOCOCCUS AUREUS  Final   Organism ID, Bacteria ENTEROCOCCUS FAECALIS  Final      Susceptibility  Enterococcus faecalis - MIC*    AMPICILLIN <=2 SENSITIVE Sensitive     VANCOMYCIN  1 SENSITIVE Sensitive     GENTAMICIN SYNERGY SENSITIVE Sensitive     * ABUNDANT ENTEROCOCCUS FAECALIS   Methicillin resistant staphylococcus aureus - MIC*    CIPROFLOXACIN >=8 RESISTANT Resistant     ERYTHROMYCIN  >=8 RESISTANT Resistant     GENTAMICIN <=0.5 SENSITIVE Sensitive     OXACILLIN >=4 RESISTANT Resistant     TETRACYCLINE <=1 SENSITIVE Sensitive     VANCOMYCIN  1 SENSITIVE Sensitive     TRIMETH/SULFA >=320 RESISTANT Resistant     CLINDAMYCIN <=0.25 SENSITIVE Sensitive     RIFAMPIN <=0.5 SENSITIVE Sensitive     Inducible Clindamycin NEGATIVE Sensitive     LINEZOLID 2 SENSITIVE Sensitive     * ABUNDANT  METHICILLIN RESISTANT STAPHYLOCOCCUS AUREUS   Pseudomonas aeruginosa - MIC*    CEFTAZIDIME 4 SENSITIVE Sensitive     CIPROFLOXACIN <=0.25 SENSITIVE Sensitive     GENTAMICIN 4 SENSITIVE Sensitive     IMIPENEM 2 SENSITIVE Sensitive     PIP/TAZO <=4 SENSITIVE Sensitive ug/mL    CEFEPIME 2 SENSITIVE Sensitive     * ABUNDANT PSEUDOMONAS AERUGINOSA  Aerobic/Anaerobic Culture w Gram Stain (surgical/deep wound)     Status: None (Preliminary result)   Collection Time: 05/27/24  5:19 PM   Specimen: Abscess  Result Value Ref Range Status   Specimen Description   Final    ABSCESS ABDOMEN Performed at [facility], [address], [place], [place] [address]    Special Requests   Final    NONE Performed at [facility], [address], [place], [place] [address]    Gram Stain   Final    ABUNDANT WBC PRESENT, PREDOMINANTLY PMN NO ORGANISMS SEEN Performed at [facility], [address] [place], [place] [address]    Culture   Final    RARE PSEUDOMONAS AERUGINOSA NO ANAEROBES ISOLATED; CULTURE IN PROGRESS FOR 5 DAYS    Report Status PENDING  Incomplete   Organism ID, Bacteria PSEUDOMONAS AERUGINOSA  Final      Susceptibility   Pseudomonas aeruginosa - MIC*    CEFTAZIDIME 4 SENSITIVE Sensitive     CIPROFLOXACIN <=0.25 SENSITIVE Sensitive     GENTAMICIN 4 SENSITIVE Sensitive     IMIPENEM 2 SENSITIVE Sensitive     PIP/TAZO <=4 SENSITIVE Sensitive ug/mL    CEFEPIME 2 SENSITIVE Sensitive     * RARE PSEUDOMONAS AERUGINOSA      Radiology Studies: No results found.     LOS: 22 days    Aneita Keens, MD [facility] 05/30/2024, 10:45 AM   If 7PM-7AM, please contact night-coverage [email or web address]

## 2024-05-30 NOTE — Progress Notes (Signed)
 PHARMACY - TOTAL PARENTERAL NUTRITION CONSULT NOTE   Indication: Prolonged ileus  Patient Measurements: Height: 5' 5 (165.1 cm) Weight: 60.9 kg (134 lb 4.2 oz) IBW/kg (Calculated) : 57 TPN AdjBW (KG): 61.3 Body mass index is 22.34 kg/m.  Assessment: 89 yoF with history of diverticulitis with perforation and abscess. 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 She requested ostomy takedown and underwent LOA, colostomy takedown, small bowel resection with anastomosis on 5/29. Pharmacy is consulted to dose TPN starting 6/2 for postop ileus.  Now with anastomotic leak complicated by multiple-intraabdominal abscesses.  Glucose / Insulin : no Hx DM, A1c 5.1%. BG goal 100-180. -CBG/SSI discontinued on 6/11 Electrolytes: none today, previously Na (133) slightly low on max Na concentration -All others WNL, including CorrCa (10) Hepatic: LFTs, Tbili, TG WNL.  -Albumin  low. Alk Phos trending down slowly I/O:  -PO intake 240 mL this am, although appears to be eating 0% of meals per documentation  -No UOP or stool volume recorded (LBM 6/20), drain 60 mL in 24 hrs GI Imaging:  -6/16 CT: Similar appearance at the rectal anastomosis concerning for dehiscence -6/9 CT: [name] dehiscence along the rectal anastomosis, free air and multiple fluid collections GI Surgeries / Procedures: -5/29: LAR, SBR, colostomy takedown, LOA -6/10 drains placed x 2 in IR (midline abd, transgluteal) -6/17: Replacement of drainage catheter by IR  Central access: Double lumen PICC placed 6/2 TPN start date: 6/2  Nutritional Goals: Goal TPN rate is 70 mL/hr (provides 94 g of protein and 1680 kcals per day)  RD Assessment:  Estimated Needs Total Energy Estimated Needs: 1600-1750 kcals Total Protein Estimated Needs: 80-95 grams Total Fluid Estimated Needs: >/= 1.6L  Current Nutrition:  -TPN  (weaned to half-rate) -Diet: Regular  Multiple calorie counts this admission. Not meeting caloric needs. Refusing all supplements; hesitant to try many foods. Unlikely to meet caloric needs, recommendation for consideration of Cortrak placement by dietitian.  Plan:  Continue TPN at 40 mL/hr (provides 54 g protein, 960 kcal) Electrolytes in TPN: No change Na 150 mEq/L (max) K 50 mEq/L Ca 5 mEq/L Mg 5 mEq/L Phos 15 mmol/L Cl:Ac 1:1 No CBG checks/SSI  MVI PO Monitor TPN labs on Mon/Thurs, and as needed.   Lolita Rise, PharmD, BCPS Clinical Pharmacist 05/30/2024 9:41 AM

## 2024-05-30 NOTE — Progress Notes (Signed)
 [facility]  Date of Admission:  05/08/2024   Total days of inpatient antibiotics 18  Principal Problem:   Diverticular disease Active Problems:   A-fib (HCC)   Restless leg   ABLA (acute blood loss anemia)   Need for emotional support   Acute urinary retention   Mixed hyperlipidemia   Essential hypertension   Diverticular disease of left colon   History of DVT (deep vein thrombosis)   History of pulmonary embolus (PE)   Pre-diabetes   Presence of IVC filter   Stricture of sigmoid colon (HCC)   Protein-calorie malnutrition, severe   Hypokalemia          Assessment:  83 year old female with past medical history of large bowel resection status post Hartmann resection and colostomy creation in October 2020 for complicated, anastomotic leak, A-fib not on anticoagulation, cervical cancer, syncopal episode admitted for colostomy takedown found to have: #Post op intra-abdominal abscesses, 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 Hershell Lose [facility] course was complicated by paroxysmal A-fib, acute blood loss requiring packed red blood cells.  Found to have postop intra-abdominal abscesses as well.  She initially had percutaneous drain placement with IR on 6/10, no cultures obtained.  Cultures from drain were polymicrobial on 6/12 growing Pseudomonas aeruginosa, MRSA, Candida albicans, E faecalis.  Patient has been on pip-tazo, micafungin, vancomycin . - She underwent a second set of drain placement on 6/17 as transgluteal drain became dislodged.  Cultures obtained from that day.  ID engaged for antibiotic recommendations.  Recommendations:  - Continue daptomycin , pip-tazo, micafungin.  Repeat cultures Pseudomonas aeruginosa, counts.  Due to limited p.o. intake, will target Candida with micafungin.  Pending  improvement in p.o. intake can transition to fluconazole .  Will keep daptomycin  given prior history of MRSA. - Plan on 4 weeks of antibiotic treatment from 6/17.  Will need repeat imaging prior to stopping antibiotics.  There is some concern that may be a fistula at TG site.  Hopefully repeat imaging will be able to shed some light on this issue.  Evaluation of this patient requires complex antimicrobial therapy evaluation and counseling + isolation needs for disease transmission risk assessment and mitigation   Microbiology:   Antibiotics: Cefotan  10//20 Erythromycin  05/09/1930 Pip-tazo 6/9-present Vancomycin  6/12-present Micafungin 6/13-present  Cultures:  Blood  Urine  Other 6/17 pending 6/12 Pseudomonas aeruginosa, MRSA, Candida albicans, E faecalis     SUBJECTIVE: Resting in bed. Afebrile overnight.    Review of Systems: Review of Systems  All other systems reviewed and are negative.    Scheduled Meds:  acetaminophen   500 mg Oral TID WC & HS   apixaban   2.5 mg Oral BID   Chlorhexidine  Gluconate Cloth  6 each Topical Daily   cyanocobalamin   1,000 mcg Oral Daily   feeding supplement  1 Container Oral TID BM   feeding supplement (KATE FARMS STANDARD 1.4)  325 mL Oral BID BM   liver oil-zinc  oxide   Topical BID   loperamide   4 mg Oral TID WC & HS   megestrol  40 mg Oral Daily   metoprolol  succinate  12.5 mg Oral Daily   multivitamin with minerals  1 tablet Oral Daily   mupirocin  ointment  1 Application Nasal BID   nystatin   Topical BID   pantoprazole   40 mg Oral Daily   polycarbophil  625 mg Oral BID   sodium chloride  flush  10-40 mL Intracatheter Q12H   sodium chloride  flush  3 mL Intravenous Q12H   sodium chloride  flush  5 mL Intracatheter Q8H   sodium chloride  flush  5 mL Intracatheter Q8H   tamsulosin   0.4 mg Oral QPC supper   Continuous Infusions:  sodium chloride  10 mL/hr at 05/26/24 2028   DAPTOmycin  500 mg (05/29/24 1512)   micafungin (MYCAMINE)  100 mg in sodium chloride  0.9 % 100 mL IVPB 100 mg (05/30/24 1129)   piperacillin -tazobactam (ZOSYN )  IV 3.375 g (05/30/24 0702)   TPN ADULT (ION) 40 mL/hr at 05/29/24 1712   TPN ADULT (ION)     PRN Meds:.sodium chloride , alum & mag hydroxide-simeth, cyclobenzaprine, diphenhydrAMINE  **OR** [DISCONTINUED] diphenhydrAMINE , diphenoxylate-atropine, fentaNYL  (SUBLIMAZE ) injection, hydrALAZINE , lip balm, menthol -cetylpyridinium, naphazoline-glycerin , [DISCONTINUED] ondansetron  **OR** ondansetron  (ZOFRAN ) IV, mouth rinse, phenol, [DISCONTINUED] prochlorperazine  **OR** prochlorperazine , simethicone , sodium chloride , sodium chloride  flush, sodium chloride  flush, traMADol , witch hazel-glycerin  Allergies  Allergen Reactions   Elemental Sulfur Anaphylaxis    OBJECTIVE: Vitals:   05/29/24 1319 05/29/24 1337 05/29/24 1945 05/30/24 0551  BP: 119/73 130/69 (!) 115/54 (!) 111/47  Pulse: 96 97 97 91  Resp: 18  16 15   Temp: 99.8 F (37.7 C) 98.7 F (37.1 C) 97.8 F (36.6 C) 98.3 F (36.8 C)  TempSrc: Oral Oral Oral Oral  SpO2: 97% 97% 97% 94%  Weight:      Height:       Body mass index is 22.34 kg/m.  Physical Exam Constitutional:      Appearance: Normal appearance.  HENT:     Head: Normocephalic and atraumatic.     Right Ear: Tympanic membrane normal.     Left Ear: Tympanic membrane normal.     Nose: Nose normal.     Mouth/Throat:     Mouth: Mucous membranes are moist.   Eyes:     Extraocular Movements: Extraocular movements intact.     Conjunctiva/sclera: Conjunctivae normal.     Pupils: Pupils are equal, round, and reactive to light.    Cardiovascular:     Rate and Rhythm: Normal rate and regular rhythm.     Heart sounds: No murmur heard.    No friction rub. No gallop.  Pulmonary:     Effort: Pulmonary effort is normal.     Breath sounds: Normal breath sounds.  Abdominal:     Comments: 2 drains in place, intact   Musculoskeletal:        General: Normal range of motion.    Skin:    General: Skin is warm and dry.   Neurological:     General: No focal deficit present.     Mental Status: She is alert and oriented to person, place, and time.   Psychiatric:        Mood and Affect: Mood normal.       Lab Results Lab Results  Component Value Date   WBC 6.9 05/29/2024   HGB 9.0 (L) 05/29/2024   HCT 29.7 (L) 05/29/2024   MCV 93.7 05/29/2024   PLT 502 (H) 05/29/2024    Lab Results  Component Value Date   CREATININE 0.84 05/29/2024   BUN 20 05/29/2024   NA 133 (L) 05/29/2024   K 3.9 05/29/2024   CL 100 05/29/2024   CO2 24 05/29/2024    Lab Results  Component Value Date   ALT 26 05/29/2024   AST 18 05/29/2024   ALKPHOS 198 (H) 05/29/2024   BILITOT 0.7  05/29/2024        Orlie Bjornstad, MD [facility] 05/30/2024, 1:15 PM

## 2024-05-30 NOTE — Consult Note (Incomplete)
 [facility]    Date of Admission:  05/08/2024   Total days of inpatient antibiotics 17        Reason for Consult: Intra-abdominal infection    Principal Problem:   Diverticular disease Active Problems:   A-fib (HCC)   Restless leg   ABLA (acute blood loss anemia)   Need for emotional support   Acute urinary retention   Mixed hyperlipidemia   Essential hypertension   Diverticular disease of left colon   History of DVT (deep vein thrombosis)   History of pulmonary embolus (PE)   Pre-diabetes   Presence of IVC filter   Stricture of sigmoid colon (HCC)   Protein-calorie malnutrition, severe   Hypokalemia   Assessment:  83 year old female with past medical history of large bowel resection status post Hartmann resection and colostomy creation in October 2020 for complicated, anastomotic leak, A-fib not on anticoagulation, cervical cancer, syncopal episode admitted for colostomy takedown found to have: #Post op intra-abdominal abscesses, 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 Hershell Lose [facility] course was complicated by paroxysmal A-fib, acute blood loss requiring packed red blood cells.  Found to have postop intra-abdominal abscesses as well.  She initially had percutaneous drain placement with IR on 6/10, no cultures obtained.  Cultures from drain were polymicrobial on 6/12 growing Pseudomonas aeruginosa, MRSA, Candida albicans, E faecalis.  Patient has been on pip-tazo, micafungin, vancomycin . - She underwent a second set of drain placement on 6/17 as transgluteal drain became dislodged.  Cultures obtained from that day.  ID engaged for antibiotic recommendations.  Recommendations:  - Continue daptomycin , pip-tazo, micafungin.  Repeat cultures Pseudomonas aeruginosa, counts.  Due to limited p.o.  intake, will target Candida with micafungin.  Pending improvement in p.o. intake can transition to fluconazole .  Will keep daptomycin  given prior history of MRSA. - Plan on 4 weeks of antibiotic treatment from 6/17.  Will need repeat imaging prior to stopping antibiotics.  There is some concern that may be a fistula at TG site.  Hopefully repeat imaging will be able to shed some light on this issue. - Dr. Cleora Daft is covering this weekend.  I will be back on service on Monday.   Evaluation of this patient requires complex antimicrobial therapy evaluation and counseling + isolation needs for disease transmission risk assessment and mitigation   Microbiology:   Antibiotics: Cefotan  10//20 Erythromycin  05/09/1930 Pip-tazo 6/9-present Vancomycin  6/12-present Micafungin 6/13-present  Cultures:  Blood  Urine  Other 6/17 pending 6/12 Pseudomonas aeruginosa, MRSA, Candida albicans, E faecalis  HPI: Elizabeth Odom is a 83 y.o. female large bowel obstruction in October status post Hartman resection and colostomy creation complicated by anastomotic leak, A-fib not on anticoagulation, hypertension, cervical cancer syncopal episode requiring loop recorder admitted for colostomy takedown. Hospital stay complicated by persistent A-fib, ABL, and abdominal abscesses.  She underwent drain placement on 6/10.  Cultures from drain on 6/12 grew polymicrobial organism.  One of her drains dislodged.  She underwent another set of drain placement on 6/17 with culture sent.  ID engaged for antibiotic recommendationsn   Review of Systems: Review of Systems  All other systems reviewed and are negative.   Past Medical History:  Diagnosis Date  . A-fib (HCC) 04/09/2016  . Acute head injury 04/09/2016  . Anemia   . Blood dyscrasia  Hx DVT / PE  . Cervical cancer (HCC)   . Cervical spine fracture, initial encounter 04/09/2016  . Claustrophobia    extremely claustrophobic  . Dupuytren disease of palm  of both hands   . Essential hypertension   . Fatty liver   . History of kidney stones   . MCI (mild cognitive impairment) with memory loss   . Multiple fractures of cervical spine (HCC) 04/09/2016  . Restless leg   . Syncope and collapse 04/09/2016    Social History   Tobacco Use  . Smoking status: Never  . Smokeless tobacco: Never  Vaping Use  . Vaping status: Never Used  Substance Use Topics  . Alcohol  use: Never    Alcohol /week: 0.0 standard drinks of alcohol   . Drug use: Never    Family History  Problem Relation Age of Onset  . Other Mother        thrombosis  . Cancer Sister   . Diabetes Sister   . Stroke Daughter   . Thyroid  disease Daughter    Scheduled Meds: . acetaminophen   500 mg Oral TID WC & HS  . apixaban   2.5 mg Oral BID  . Chlorhexidine  Gluconate Cloth  6 each Topical Daily  . cyanocobalamin   1,000 mcg Oral Daily  . feeding supplement  1 Container Oral TID BM  . feeding supplement (KATE FARMS STANDARD 1.4)  325 mL Oral BID BM  . liver oil-zinc  oxide   Topical BID  . loperamide   4 mg Oral TID WC & HS  . megestrol  40 mg Oral Daily  . metoprolol  succinate  12.5 mg Oral Daily  . multivitamin with minerals  1 tablet Oral Daily  . mupirocin  ointment  1 Application Nasal BID  . nystatin   Topical BID  . pantoprazole   40 mg Oral Daily  . polycarbophil  625 mg Oral BID  . sodium chloride  flush  10-40 mL Intracatheter Q12H  . sodium chloride  flush  3 mL Intravenous Q12H  . sodium chloride  flush  5 mL Intracatheter Q8H  . sodium chloride  flush  5 mL Intracatheter Q8H  . tamsulosin   0.4 mg Oral QPC supper   Continuous Infusions: . sodium chloride  10 mL/hr at 05/26/24 2028  . DAPTOmycin  500 mg (05/29/24 1512)  . micafungin (MYCAMINE) 100 mg in sodium chloride  0.9 % 100 mL IVPB 100 mg (05/30/24 1129)  . piperacillin -tazobactam (ZOSYN )  IV 3.375 g (05/30/24 0702)  . TPN ADULT (ION) 40 mL/hr at 05/29/24 1712  . TPN ADULT (ION)     PRN Meds:.sodium  chloride, alum & mag hydroxide-simeth, cyclobenzaprine, diphenhydrAMINE  **OR** [DISCONTINUED] diphenhydrAMINE , diphenoxylate-atropine, fentaNYL  (SUBLIMAZE ) injection, hydrALAZINE , lip balm, menthol -cetylpyridinium, naphazoline-glycerin , [DISCONTINUED] ondansetron  **OR** ondansetron  (ZOFRAN ) IV, mouth rinse, phenol, [DISCONTINUED] prochlorperazine  **OR** prochlorperazine , simethicone , sodium chloride , sodium chloride  flush, sodium chloride  flush, traMADol , witch hazel-glycerin  Allergies  Allergen Reactions  . Elemental Sulfur Anaphylaxis    OBJECTIVE: Blood pressure (!) 111/47, pulse 91, temperature 98.3 F (36.8 C), temperature source Oral, resp. rate 15, height 5' 5 (1.651 m), weight 60.9 kg, SpO2 94%.  Physical Exam Constitutional:      Appearance: Normal appearance.  HENT:     Head: Normocephalic and atraumatic.     Right Ear: Tympanic membrane normal.     Left Ear: Tympanic membrane normal.     Nose: Nose normal.     Mouth/Throat:     Mouth: Mucous membranes are moist.   Eyes:     Extraocular Movements: Extraocular movements intact.  Conjunctiva/sclera: Conjunctivae normal.     Pupils: Pupils are equal, round, and reactive to light.    Cardiovascular:     Rate and Rhythm: Normal rate and regular rhythm.     Heart sounds: No murmur heard.    No friction rub. No gallop.  Pulmonary:     Effort: Pulmonary effort is normal.     Breath sounds: Normal breath sounds.  Abdominal:     Comments: Drain   Musculoskeletal:        General: Normal range of motion.   Skin:    General: Skin is warm and dry.   Neurological:     General: No focal deficit present.     Mental Status: She is alert and oriented to person, place, and time.   Psychiatric:        Mood and Affect: Mood normal.    Lab Results Lab Results  Component Value Date   WBC 6.9 05/29/2024   HGB 9.0 (L) 05/29/2024   HCT 29.7 (L) 05/29/2024   MCV 93.7 05/29/2024   PLT 502 (H) 05/29/2024    Lab Results   Component Value Date   CREATININE 0.84 05/29/2024   BUN 20 05/29/2024   NA 133 (L) 05/29/2024   K 3.9 05/29/2024   CL 100 05/29/2024   CO2 24 05/29/2024    Lab Results  Component Value Date   ALT 26 05/29/2024   AST 18 05/29/2024   ALKPHOS 198 (H) 05/29/2024   BILITOT 0.7 05/29/2024       Orlie Bjornstad, MD [facility] 05/30/2024, 12:42 PM

## 2024-05-30 NOTE — Plan of Care (Signed)
  Problem: Education: Goal: Verbalization of understanding of the causes of altered bowel function will improve Outcome: Progressing   Problem: Bowel/Gastric: Goal: Gastrointestinal status for postoperative course will improve Outcome: Progressing   Problem: Health Behavior/Discharge Planning: Goal: Identification of community resources to assist with postoperative recovery needs will improve Outcome: Progressing   Problem: Nutritional: Goal: Will attain and maintain optimal nutritional status will improve Outcome: Progressing   Problem: Clinical Measurements: Goal: Postoperative complications will be avoided or minimized Outcome: Progressing   Problem: Respiratory: Goal: Respiratory status will improve Outcome: Progressing   Problem: Skin Integrity: Goal: Will show signs of wound healing Outcome: Progressing   Problem: Education: Goal: Knowledge of General Education information will improve Description: Including pain rating scale, medication(s)/side effects and non-pharmacologic comfort measures Outcome: Progressing   Problem: Health Behavior/Discharge Planning: Goal: Ability to manage health-related needs will improve Outcome: Progressing   Problem: Clinical Measurements: Goal: Ability to maintain clinical measurements within normal limits will improve Outcome: Progressing Goal: Will remain free from infection Outcome: Progressing Goal: Diagnostic test results will improve Outcome: Progressing Goal: Respiratory complications will improve Outcome: Progressing Goal: Cardiovascular complication will be avoided Outcome: Progressing   Problem: Activity: Goal: Risk for activity intolerance will decrease Outcome: Progressing   Problem: Coping: Goal: Level of anxiety will decrease Outcome: Progressing   Problem: Elimination: Goal: Will not experience complications related to bowel motility Outcome: Progressing Goal: Will not experience complications related to  urinary retention Outcome: Progressing   Problem: Pain Managment: Goal: General experience of comfort will improve and/or be controlled Outcome: Progressing   Problem: Safety: Goal: Ability to remain free from injury will improve Outcome: Progressing   Problem: Skin Integrity: Goal: Risk for impaired skin integrity will decrease Outcome: Progressing   Problem: Education: Goal: Ability to describe self-care measures that may prevent or decrease complications (Diabetes Survival Skills Education) will improve Outcome: Progressing Goal: Individualized Educational Video(s) Outcome: Progressing   Problem: Coping: Goal: Ability to adjust to condition or change in health will improve Outcome: Progressing   Problem: Fluid Volume: Goal: Ability to maintain a balanced intake and output will improve Outcome: Progressing   Problem: Health Behavior/Discharge Planning: Goal: Ability to identify and utilize available resources and services will improve Outcome: Progressing Goal: Ability to manage health-related needs will improve Outcome: Progressing   Problem: Metabolic: Goal: Ability to maintain appropriate glucose levels will improve Outcome: Progressing   Problem: Nutritional: Goal: Maintenance of adequate nutrition will improve Outcome: Progressing Goal: Progress toward achieving an optimal weight will improve Outcome: Progressing   Problem: Skin Integrity: Goal: Risk for impaired skin integrity will decrease Outcome: Progressing   Problem: Tissue Perfusion: Goal: Adequacy of tissue perfusion will improve Outcome: Progressing

## 2024-05-30 NOTE — Progress Notes (Signed)
 05/30/2024  Elizabeth Odom [id number] Sep 01, 1941  CARE TEAM: PCP: Anthon Kins, MD  Outpatient Care Team: Patient Care Team: Anthon Kins, MD as PCP - General (Internal Medicine) Cody Das, MD as PCP - Cardiology (Cardiology) Lajuan Pila, MD as Consulting Physician (Gastroenterology) Shela Derby, MD as Consulting Physician (General Surgery) Glory Larsen, MD (Neurology) Lahoma Pigg, MD as Consulting Physician (Urology) Sandee Crook Margart Shears, MD as Consulting Physician (Cardiology) Candyce Champagne, MD as Consulting Physician (Colon and Rectal Surgery)  Inpatient Treatment Team: Treatment Team:  Candyce Champagne, MD Ruel Cotta, Heinz Llano, MD Ozell Blunt, MD Vladimir Groves Radiology, MD Lexine Redder, MD Audria Leather, MD Allred, Boyd Cabal, PA-C Mugweru, Jon, MD Rosetta Cons, MD Verlyn Goad, MD Orlie Bjornstad, MD Philip Bravo, RN Lawayne President, RRT Monty App, RN Lolita Rise, [facility]   Problem List:   Principal Problem:   Diverticular disease Active Problems:   A-fib [facility])   Restless leg   ABLA (acute blood loss anemia)   Need for emotional support   Acute urinary retention   Mixed hyperlipidemia   Essential hypertension   Diverticular disease of left colon   History of DVT (deep vein thrombosis)   History of pulmonary embolus (PE)   Pre-diabetes   Presence of IVC filter   Stricture of sigmoid colon (HCC)   Protein-calorie malnutrition, severe   Hypokalemia   05/08/2024  POST-OPERATIVE DIAGNOSIS:   COLOSTOMY FOR RESECTION, DESIRE FOR OSTOMY TAKEDOWN RECTAL STRICTURE HISTORY OF DIVERTICULITIS WITH PERFORATION & ABSCESS   PROCEDURE:   -ROBOTIC RECTOSIGMOID RESECTION (LAR) -TAKEDOWN OF END COLOSTOMY WITH ANASTOMOSIS -RESECTION OF SMALL INTESTINE WITH ANASTOMOSIS -SMALL BOWEL REPAIR -LYSIS OF ADHESIONS x 115 MINUTES (66% OF CASE),  -INTRAOPERATIVE ASSESSMENT OF TISSUE VASCULAR PERFUSION USING ICG  (indocyanine green ) IMMUNOFLUORESCENCE,  -TRANSVERSUS ABDOMINIS PLANE (TAP) BLOCK - BILATERAL -FLEXIBLE SIGMOIDOSCOPY   SURGEON:  Eddye Goodie, MD  OR FINDINGS:  Very dense adhesions of small bowel especially to pelvis and retroperitoneum.  Extremely concrete dense adhesions with friable tissue near her old prior small bowel anastomosis and distal jejunum.  Required small bowel repair and required small bowel resection including old anastomosis given severe tissues.  Patient had significant fibrotic stricturing of rectal stump at the mid/distal rectal junction.  Could not be released up.  Ended up doing resection of rectal stump to mid/distal rectal junction.  No obvious metastatic disease on visceral parietal peritoneum or liver.   It is a 29mm EEA anastomosis ( distal descending colon  connected to mid/distal rectal junction.)  It rests 7 cm from the anal verge by rigid proctoscopy.  05/20/2024  Post procedural Dx: Post op abscess, multiple   Technically successful CT guided placed of a  10 Fr drainage catheter placement x 2  into the midline ventral and dorsal perirectal abscesses.     Rosetta Cons, MD    Assessment [facility] Stay = 22 days) 22 Days Post-Op    Diarrhea in the setting of contained colorectal anastomotic leak    Plan:  I long discussion with the patient again.  She remains very picky in what she eats.  She remains wanting to stay in bed.  We will see if we can turn this around through the weekend.  If she does not improve in the next few days, then I will push more strongly for fecal diversion.  See if I can do a diverting loop ileostomy to get the anastomosis to be under better  control (and hope the anastomosis will heal with diversion and be able to have the diverting ostomy taken down in 6 months) versus transitioning to a permanent colostomy.  Patient does not want.  She is not in any shock, so we will try and see if we can turn things around.  Internal  drainage  Perc drain in abdomen collapsed - redirected to last major abscess - decompressed.  That abscess is growing no organisms, a guardedly hopeful sign that most of the infection is getting under control.  New transgluteal drain replaced - need to keep near contained leak  Antidiarrheal regimen -  Loperamide  4 times daily.  Try and switch back to FiberCon which is more soluble and less diarrhea forming.  Lomotil for breakthrough.  Growing numerous organisms including Pseudomonas, MRSA and Candida albicans.    Discussed with Dr. Zelda Hickman with infectious disease.  She suspects many of these organs are just colonization/contamination of the drain I do not represent necessarily any abscesses.  She is adjusting antibiotics.  Will see what the new drain shows.  She is leaning towards 4 weeks of antibiotics postop just in case.  Pain control - opioids make her rather sedated/groggy.  Continue gabapentin /acetaminophen /cyclobenzaprine  Severe malnutrition   TPN for severe protein malnutrition.    Retry calorie counts now that all the drains are done.  Will do half TPN for now.  Add Megace  Prealbumin starting to climb in more normal range which is a guardedly hopeful sign.    History of urinary retention  Foley catheter out with no problems with urination now that pelvic collection under better control.    Continue tamsulosin    if fails then replace and leave for several weeks until can follow-up with urology as an outpatient.  Less likely now  No rectal tubes.  Atrial fibrillation postop.  History of a prior event.    Cardiology signed off for now.  Reconsult as needed.  Stop telemetry for now  Restarted DOAC Eliquis  6/12 after prior hematochezia.  No strong evidence of that.  Follow hemoglobin.  Holding Eliquis  6/15 in anticipation of drain replacement 6/17, and restart 6/18  Rate controlled switch from amlodipine  to metoprolol .  Less tachycardic.  Blood pressure soft.   Back down 12.5 mg of metoprolol  and follow with PRN (as needed) breakthrough  -monitor electrolytes & replace as needed.  Keep K>4, Mg>2, Phos>3  -VTE prophylaxis- SCDs.  Anticoagulation as appropriate  -mobilize as tolerated to help recovery.  Enlist therapies in moderate/high risk patients as appropriate.  Patient is refusing skilled facility or any rehab but is not getting up much.  Try to have strategies to help her get up and mobilize and actually work with physical therapy, or she is going to lose her argument.    I updated the patient's status to the patient and nurse.  Recommendations were made.  Questions were answered.  They expressed understanding & appreciation.  -Disposition: TBD - I think she needs SNF.  She has been very resistant, but she may reconsider      I reviewed last 24 h vitals and pain scores, last 48 h intake and output, last 24 h labs and trends, and last 24 h imaging results.  I have reviewed this patient's available data, including medical history, events of note, test results, etc as part of my evaluation.   A significant portion of that time was spent in counseling. Care during the described time interval was provided by me.  This care  required moderate level of medical decision making.  05/30/2024    Subjective: (Chief complaint)  No major events.  Thirsty but not wanting to eat and drink much.  Nutrition met with her so she is trying different supplemental shakes.  Refused physical therapy yesterday.  Had diarrhea during the day but last 12 hours has been much more quiet.  Urinating.  Complains of pain at the abdominal drain spot only.    Objective:  Vital signs:  Vitals:   05/29/24 1319 05/29/24 1337 05/29/24 1945 05/30/24 0551  BP: 119/73 130/69 (!) 115/54 (!) 111/47  Pulse: 96 97 97 91  Resp: 18  16 15   Temp: 99.8 F (37.7 C) 98.7 F (37.1 C) 97.8 F (36.6 C) 98.3 F (36.8 C)  TempSrc: Oral Oral Oral Oral  SpO2: 97% 97% 97%  94%  Weight:      Height:        Last BM Date : 05/30/24  Intake/Output   Yesterday:  06/19 0701 - 06/20 0700 In: 2250.2 [P.O.:420; I.V.:1372.9; IV Piggyback:457.2] Out: 60 [Drains:60] This shift:  Total I/O In: 10 [I.V.:10] Out: -   Bowel function:  Flatus: YES  BM:  YES - loose oatmeal consistency   Drain:  Right lower quadrant drain: Primarily serous  Transgluteal: light brown/thinly feculent  Physical Exam:  General: Alert.  More talkative.       Eyes: PERRL, normal EOM.  Sclera clear.  No icterus Neuro: CN II-XII intact w/o focal sensory/motor deficits.  All extremities.  No facial droop. Lymph: No head/neck/groin lymphadenopathy Psych:  No delerium/psychosis/paranoia.  Oriented x 4 HENT: Normocephalic, Mucus membranes moist.  No thrush.  Mild HOH Neck: Supple, No tracheal deviation.  No obvious thyromegaly.  Prefers to sit with head flexed and chin down.  Does not want to have her neck back.  Pillow adjusted. Chest: No pain to chest wall compression.  Good respiratory excursion.  No audible wheezing CV:  Pulses intact.  Regular rhythm.  No major extremity edema MS: Normal AROM mjr joints.  No obvious deformity  Abdomen:  Soft.  Mildy distended.  Nontender.  No guarding.   No complaints of pain to percussion or benching or cough. No evidence of peritonitis. Old ostomy incision with normal healing ridge.    GU: No Foley Rectal: Scant thick stool perianally.  There is some barrier cream.  Some erythema and irritation  Ext:   No deformity.  No mjr edema.  No cyanosis Skin: No petechiae / purpurea.  No major sores.  Warm and dry    Results:   Cultures: Recent Results (from the past 720 hours)  Urine Culture     Status: Abnormal   Collection Time: 05/09/24  8:30 PM   Specimen: Urine, Clean Catch  Result Value Ref Range Status   Specimen Description   Final    URINE, CLEAN CATCH Performed at [facility], [address], [place],  [place] [address]    Special Requests   Final    NONE Performed at [facility], [address] [place], [place] [address]    Culture (A)  Final    <10,000 COLONIES/mL INSIGNIFICANT GROWTH Performed at [facility], [address], [place], [place] [address]    Report Status 05/11/2024 FINAL  Final  Aerobic/Anaerobic Culture w Gram Stain (surgical/deep wound)     Status: None (Preliminary result)   Collection Time: 05/22/24  3:59 PM   Specimen: Abdomen  Result Value Ref Range  Status   Specimen Description   Final    ABDOMEN Performed at [facility], [address], [place], [place] [address]    Special Requests   Final    ABDOMINAL DRAIN Performed at [facility], [address] [place], [place] [address]    Gram Stain   Final    ABUNDANT WBC PRESENT, PREDOMINANTLY PMN RARE GRAM POSITIVE COCCI RARE YEAST WITH PSEUDOHYPHAE    Culture   Final    MODERATE PSEUDOMONAS AERUGINOSA MODERATE STAPHYLOCOCCUS AUREUS SUSCEPTIBILITIES PERFORMED ON PREVIOUS CULTURE WITHIN THE LAST 5 DAYS. MODERATE CANDIDA ALBICANS Sent to [facility] for further susceptibility testing. NO ANAEROBES ISOLATED Performed at [facility], [address], [place], [place] [address]    Report Status PENDING  Incomplete   Organism ID, Bacteria PSEUDOMONAS AERUGINOSA  Final      Susceptibility   Pseudomonas aeruginosa - MIC*    CEFTAZIDIME 4 SENSITIVE Sensitive     CIPROFLOXACIN <=0.25 SENSITIVE Sensitive     GENTAMICIN 4 SENSITIVE Sensitive     IMIPENEM 2 SENSITIVE Sensitive     PIP/TAZO <=4 SENSITIVE Sensitive ug/mL    CEFEPIME 2 SENSITIVE Sensitive     * MODERATE PSEUDOMONAS AERUGINOSA  Aerobic/Anaerobic Culture w Gram Stain (surgical/deep wound)     Status: None   Collection Time: 05/22/24  3:59 PM   Specimen: Back  Result Value Ref Range Status   Specimen Description   Final    BACK Performed at [facility], [address], [place], [place] [address]    Special Requests   Final    TRANSGLUTEAL PERCUTANEOUS IR DRAIN Performed at [facility], [address], [place], [place] [address]    Gram Stain   Final    NO WBC SEEN ABUNDANT GRAM NEGATIVE RODS RARE GRAM POSITIVE COCCI IN PAIRS RARE BUDDING YEAST SEEN    Culture   Final    ABUNDANT PSEUDOMONAS AERUGINOSA ABUNDANT METHICILLIN RESISTANT STAPHYLOCOCCUS AUREUS ABUNDANT ENTEROCOCCUS FAECALIS ABUNDANT CANDIDA ALBICANS NO ANAEROBES ISOLATED Sent to [facility] for further susceptibility testing. Performed at [facility], [address], [place], [place] [address]    Report Status 05/27/2024 FINAL  Final   Organism ID, Bacteria PSEUDOMONAS AERUGINOSA  Final   Organism ID, Bacteria METHICILLIN RESISTANT STAPHYLOCOCCUS AUREUS  Final   Organism ID, Bacteria ENTEROCOCCUS FAECALIS  Final      Susceptibility   Enterococcus faecalis - MIC*    AMPICILLIN <=2 SENSITIVE Sensitive     VANCOMYCIN  1 SENSITIVE Sensitive     GENTAMICIN SYNERGY SENSITIVE Sensitive     * ABUNDANT ENTEROCOCCUS FAECALIS   Methicillin resistant staphylococcus aureus - MIC*    CIPROFLOXACIN >=8 RESISTANT Resistant     ERYTHROMYCIN  >=8 RESISTANT Resistant     GENTAMICIN <=0.5 SENSITIVE Sensitive     OXACILLIN >=4 RESISTANT Resistant     TETRACYCLINE <=1 SENSITIVE Sensitive     VANCOMYCIN  1 SENSITIVE Sensitive     TRIMETH/SULFA >=320 RESISTANT Resistant     CLINDAMYCIN <=0.25 SENSITIVE Sensitive     RIFAMPIN <=0.5 SENSITIVE Sensitive     Inducible Clindamycin NEGATIVE Sensitive     LINEZOLID 2 SENSITIVE Sensitive     * ABUNDANT METHICILLIN RESISTANT STAPHYLOCOCCUS AUREUS   Pseudomonas aeruginosa - MIC*    CEFTAZIDIME 4 SENSITIVE Sensitive     CIPROFLOXACIN <=0.25 SENSITIVE Sensitive     GENTAMICIN 4 SENSITIVE Sensitive     IMIPENEM 2 SENSITIVE Sensitive     PIP/TAZO <=4  SENSITIVE Sensitive ug/mL    CEFEPIME 2 SENSITIVE Sensitive     * ABUNDANT  PSEUDOMONAS AERUGINOSA  Aerobic/Anaerobic Culture w Gram Stain (surgical/deep wound)     Status: None (Preliminary result)   Collection Time: 05/27/24  5:19 PM   Specimen: Abscess  Result Value Ref Range Status   Specimen Description   Final    ABSCESS ABDOMEN Performed at [facility], [address], [place], [place] [address]    Special Requests   Final    NONE Performed at [facility], [address] [place], [place] [address]    Gram Stain   Final    ABUNDANT WBC PRESENT, PREDOMINANTLY PMN NO ORGANISMS SEEN Performed at [facility], [address], [place], [place] [address]    Culture   Final    RARE PSEUDOMONAS AERUGINOSA SUSCEPTIBILITIES TO FOLLOW NO ANAEROBES ISOLATED; CULTURE IN PROGRESS FOR 5 DAYS    Report Status PENDING  Incomplete    Labs: Results for orders placed or performed during the hospital encounter of 05/08/24 (from the past 48 hours)  Vancomycin , peak     Status: Abnormal   Collection Time: 05/28/24 11:32 AM  Result Value Ref Range   Vancomycin  Pk 23 (L) 30 - 40 ug/mL    Comment: Performed at [facility], [address] [place], [place] [address]  Comprehensive metabolic panel     Status: Abnormal   Collection Time: 05/29/24  4:09 AM  Result Value Ref Range   Sodium 133 (L) 135 - 145 mmol/L   Potassium 3.9 3.5 - 5.1 mmol/L   Chloride 100 98 - 111 mmol/L   CO2 24 22 - 32 mmol/L   Glucose, Bld 112 (H) 70 - 99 mg/dL    Comment: Glucose reference range applies only to samples taken after fasting for at least 8 hours.   BUN 20 8 - 23 mg/dL   Creatinine, Ser 2.95 0.44 - 1.00 mg/dL   Calcium  8.1 (L) 8.9 - 10.3 mg/dL   Total Protein 6.6 6.5 - 8.1 g/dL   Albumin  1.6 (L) 3.5 - 5.0 g/dL   AST 18 15 - 41 U/L   ALT 26 0 - 44 U/L   Alkaline Phosphatase 198 (H) 38 - 126 U/L   Total Bilirubin 0.7 0.0 - 1.2 mg/dL   GFR, Estimated >28 >41 mL/min    Comment: (NOTE) Calculated using the  CKD-EPI Creatinine Equation (2021)    Anion gap 9 5 - 15    Comment: Performed at [facility], [address] [place], [place] [address]  Magnesium      Status: None   Collection Time: 05/29/24  4:09 AM  Result Value Ref Range   Magnesium  1.8 1.7 - 2.4 mg/dL    Comment: Performed at [facility], [address], [place], [place] [address]  Phosphorus     Status: None   Collection Time: 05/29/24  4:09 AM  Result Value Ref Range   Phosphorus 3.5 2.5 - 4.6 mg/dL    Comment: Performed at [facility], [address] [place], [place] [address]  CBC     Status: Abnormal   Collection Time: 05/29/24  4:09 AM  Result Value Ref Range   WBC 6.9 4.0 - 10.5 K/uL   RBC 3.17 (L) 3.87 - 5.11 MIL/uL   Hemoglobin 9.0 (L) 12.0 - 15.0 g/dL   HCT 40.3 (L) 47.4 - 25.9 %   MCV 93.7 80.0 -  100.0 fL   MCH 28.4 26.0 - 34.0 pg   MCHC 30.3 30.0 - 36.0 g/dL   RDW 25.4 (H) 27.0 - 62.3 %   Platelets 502 (H) 150 - 400 K/uL   nRBC 0.0 0.0 - 0.2 %    Comment: Performed at [facility], [address], [place], [place] [address]  CK     Status: Abnormal   Collection Time: 05/30/24  3:12 AM  Result Value Ref Range   Total CK 13 (L) 38 - 234 U/L    Comment: Performed at [facility], [address], [place], [place] [address]    Imaging / Studies: No results found.     Medications / Allergies: per chart  Antibiotics: Anti-infectives (From admission, onward)    Start     Dose/Rate Route Frequency Ordered Stop   05/29/24 1500  DAPTOmycin  (CUBICIN ) IVPB 500 mg/90mL premix        8 mg/kg  60.9 kg 100 mL/hr over 30 Minutes Intravenous Daily 05/29/24 1359     05/28/24 2100  vancomycin  (VANCOREADY) IVPB 750 mg/150 mL  Status:  Discontinued        750 mg 150 mL/hr over 60 Minutes Intravenous Every 12 hours 05/28/24 1419 05/29/24 1359   05/27/24 0800  vancomycin  (VANCOCIN ) IVPB 1000 mg/200 mL premix  Status:   Discontinued        1,000 mg 200 mL/hr over 60 Minutes Intravenous Every 24 hours 05/26/24 1144 05/28/24 1419   05/23/24 1400  piperacillin -tazobactam (ZOSYN ) IVPB 3.375 g        3.375 g 12.5 mL/hr over 240 Minutes Intravenous Every 8 hours 05/23/24 0804     05/23/24 1200  micafungin (MYCAMINE) 100 mg in sodium chloride  0.9 % 100 mL IVPB        100 mg 105 mL/hr over 1 Hours Intravenous Every 24 hours 05/23/24 0755 06/06/24 1159   05/22/24 0800  vancomycin  (VANCOCIN ) IVPB 1000 mg/200 mL premix  Status:  Discontinued        1,000 mg 200 mL/hr over 60 Minutes Intravenous Every 24 hours 05/21/24 0634 05/26/24 0806   05/21/24 0730  vancomycin  (VANCOREADY) IVPB 1250 mg/250 mL        1,250 mg 166.7 mL/hr over 90 Minutes Intravenous  Once 05/21/24 0634 05/21/24 1043   05/19/24 1400  piperacillin -tazobactam (ZOSYN ) IVPB 3.375 g  Status:  Discontinued        3.375 g 12.5 mL/hr over 240 Minutes Intravenous Every 8 hours 05/19/24 1303 05/23/24 0804   05/09/24 0900  erythromycin  250 mg in sodium chloride  0.9 % 100 mL IVPB        250 mg 100 mL/hr over 60 Minutes Intravenous Every 8 hours 05/09/24 0732 05/11/24 0044   05/08/24 2200  cefoTEtan  (CEFOTAN ) 2 g in sodium chloride  0.9 % 100 mL IVPB        2 g 200 mL/hr over 30 Minutes Intravenous Every 12 hours 05/08/24 1759 05/09/24 0749   05/08/24 1400  neomycin  (MYCIFRADIN ) tablet 1,000 mg  Status:  Discontinued       Placed in And Linked Group   1,000 mg Oral 3 times per day 05/08/24 1119 05/08/24 1120   05/08/24 1400  metroNIDAZOLE  (FLAGYL ) tablet 1,000 mg  Status:  Discontinued       Placed in And Linked Group   1,000 mg Oral 3 times per day 05/08/24 1119 05/08/24 1120   05/08/24 1130  cefoTEtan  (CEFOTAN ) 2 g in sodium chloride  0.9 %  100 mL IVPB        2 g 200 mL/hr over 30 Minutes Intravenous On call to O.R. 05/08/24 1119 05/09/24 0749         Note: Portions of this report may have been transcribed using voice recognition software.  Every effort was made to ensure accuracy; however, inadvertent computerized transcription errors may be present.   Any transcriptional errors that result from this process are unintentional.    Eddye Goodie, MD, FACS, MASCRS Esophageal, Gastrointestinal & Colorectal Surgery Robotic and Minimally Invasive Surgery  [facility] [address] [place], [place] [address] [phone] Fax [phone] Main  CONTACT INFORMATION: Weekday (9AM-5PM): Call [facility] main office at [phone] Weeknight (5PM-9AM) or Weekend/Holiday: Check EPIC Web Links tab & use AMION (password  [email or web address]) for General Surgery [facility] coverage  Please, DO NOT use SecureChat  (it is not reliable communication to reach operating surgeons & will lead to a delay in care).   Epic staff messaging available for outptient concerns needing 1-2 business day response.      05/30/2024  7:32 AM

## 2024-05-30 NOTE — Progress Notes (Signed)
 Calorie Count Note  48 hour calorie count ordered.  Diet: Regular Supplements:  [employer] 1.4 PO BID, each provides 455 kcals and 20g protein  6/19: Breakfast: 0% Lunch: 0% Dinner: nothing ordered Supplements: declined supplement Mainly consuming Coke.  Total intake: Insubstantial  6/20: Refused any breakfast this AM  Nutrition Dx: Severe Malnutrition related to chronic illness as evidenced by severe fat depletion, severe muscle depletion.   Goal: Pt to meet >/= 50% of their estimated nutrition needs    Intervention:  -[employer] BID -Encourage PO intakes -TPN per Pharmacy -Continue vitamin supplementation -Consider Cortrak tube placement vs palliative care consult.   Arna Better, MS, RD, LDN Inpatient Clinical Dietitian Contact via Secure chat

## 2024-05-30 NOTE — TOC Progression Note (Addendum)
 Transition of Care [facility]) - Progression Note    Patient Details  Name: Elizabeth Odom MRN: [id number] Date of Birth: 01-May-1941  Transition of Care [facility]) CM/SW Contact  Amaryllis Junior, [place] Phone Number: 05/30/2024, 11:54 AM  Clinical Narrative:    Per MD, pt may not be medically ready to dc until possibly next week. Pt may need reoperation and ostomy if she does not improve through the weekend. Will re check bed offers/availability for bed choice closer to medical readiness. TOC continues to follow.    Expected Discharge Plan: Home/Self Care Barriers to Discharge: Continued Medical Work up  Expected Discharge Plan and Services   Discharge Planning Services: CM Consult   Living arrangements for the past 2 months: Apartment                                       Social Determinants of Health (SDOH) Interventions SDOH Screenings   Food Insecurity: No Food Insecurity (05/09/2024)  Housing: Low Risk  (05/09/2024)  Transportation Needs: No Transportation Needs (05/09/2024)  Utilities: Not At Risk (05/09/2024)  Depression (PHQ2-9): Low Risk  (01/02/2024)  Social Connections: Unknown (05/09/2024)  Tobacco Use: Low Risk  (05/08/2024)    Readmission Risk Interventions    05/12/2024    2:23 PM 05/09/2024    2:46 PM 10/19/2023    6:13 PM  Readmission Risk Prevention Plan  Transportation Screening Complete Complete Complete  PCP or Specialist Appt within 5-7 Days   Complete  PCP or Specialist Appt within 3-5 Days Complete Complete   Home Care Screening   Complete  Medication Review (RN CM)   Complete  HRI or Home Care Consult Complete Complete   Social Work Consult for Recovery Care Planning/Counseling Complete Complete   Palliative Care Screening Not Applicable Not Applicable   Medication Review Oceanographer) Complete Complete

## 2024-05-31 DIAGNOSIS — K5790 Diverticulosis of intestine, part unspecified, without perforation or abscess without bleeding: Secondary | ICD-10-CM | POA: Diagnosis not present

## 2024-05-31 MED ORDER — TRAVASOL 10 % IV SOLN
INTRAVENOUS | Status: AC
  Filled 2024-05-31: qty 537.6

## 2024-05-31 NOTE — Progress Notes (Signed)
 PHARMACY - TOTAL PARENTERAL NUTRITION CONSULT NOTE   Indication: Prolonged ileus  Patient Measurements: Height: 5' 5 (165.1 cm) Weight: 58.8 kg (129 lb 10.1 oz) IBW/kg (Calculated) : 57 TPN AdjBW (KG): 61.3 Body mass index is 21.57 kg/m.  Assessment: 89 yoF with history of diverticulitis with perforation and abscess. 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 She requested ostomy takedown and underwent LOA, colostomy takedown, small bowel resection with anastomosis on 5/29. Pharmacy is consulted to dose TPN starting 6/2 for postop ileus.  Now with anastomotic leak complicated by multiple-intraabdominal abscesses.  Glucose / Insulin : no Hx DM, A1c 5.1%. BG goal 100-180. -CBG/SSI discontinued on 6/11 Electrolytes: none today, previously Na (133) slightly low on max Na concentration -All others WNL, including CorrCa (10) Hepatic: LFTs, Tbili, TG WNL.  -Albumin  low. Alk Phos trending down slowly I/O:  -Minimal PO intake -No UOP or stool volume recorded (LBM 6/20), drain output last recorded 6/20 GI Imaging:  -6/16 CT: Similar appearance at the rectal anastomosis concerning for dehiscence -6/9 CT: Dempsey dehiscence along the rectal anastomosis, free air and multiple fluid collections GI Surgeries / Procedures: -5/29: LAR, SBR, colostomy takedown, LOA -6/10 drains placed x 2 in IR (midline abd, transgluteal) -6/17: Replacement of drainage catheter by IR  Central access: Double lumen PICC placed 6/2 TPN start date: 6/2  Nutritional Goals: Goal TPN rate is 70 mL/hr (provides 94 g of protein and 1680 kcals per day)  RD Assessment:  Estimated Needs Total Energy Estimated Needs: 1600-1750 kcals Total Protein Estimated Needs: 80-95 grams Total Fluid Estimated Needs: >/= 1.6L  Current Nutrition:  -TPN (weaned to half-rate) -Diet: Regular  Multiple calorie counts  this admission. Not meeting caloric needs. Refusing all supplements; hesitant to try many foods. Essentially eating 0% of all meals. Unlikely to meet caloric needs, recommendation for consideration of Cortrak placement by dietitian.  Plan:  Continue TPN at 40 mL/hr (provides 54 g protein, 960 kcal) Electrolytes in TPN: No change Na 150 mEq/L (max) K 50 mEq/L Ca 5 mEq/L Mg 5 mEq/L Phos 15 mmol/L Cl:Ac 1:1 No CBG checks/SSI  MVI PO Monitor TPN labs on Mon/Thurs, and as needed.   [name], PharmD, BCPS Clinical Pharmacist 05/31/2024 10:03 AM

## 2024-05-31 NOTE — Progress Notes (Signed)
 23 Days Post-Op  Subjective: Feels about the same.  Doesn't have an apettite   Objective: Vital signs in last 24 hours: Temp:  [98.5 F (36.9 C)-99.1 F (37.3 C)] 98.5 F (36.9 C) (06/21 0612) Pulse Rate:  [83-92] 87 (06/21 0612) Resp:  [15-17] 15 (06/21 0612) BP: (113-125)/(53-69) 125/69 (06/21 0612) SpO2:  [96 %-98 %] 98 % (06/21 0612) Weight:  [58.8 kg] 58.8 kg (06/21 0500)   Intake/Output from previous day: 06/20 0701 - 06/21 0700 In: 1310.9 [P.O.:670; I.V.:409.4; IV Piggyback:211.6] Out: 10 [Drains:10] Intake/Output this shift: No intake/output data recorded.   General appearance: alert and cooperative GI: soft, non-distended  Incision: healing well  Lab Results:  Recent Labs    05/29/24 0409  WBC 6.9  HGB 9.0*  HCT 29.7*  PLT 502*   BMET Recent Labs    05/29/24 0409  NA 133*  K 3.9  CL 100  CO2 24  GLUCOSE 112*  BUN 20  CREATININE 0.84  CALCIUM  8.1*   PT/INR No results for input(s): LABPROT, INR in the last 72 hours. ABG No results for input(s): PHART, HCO3 in the last 72 hours.  Invalid input(s): PCO2, PO2  MEDS, Scheduled  acetaminophen   500 mg Oral TID WC & HS   apixaban   2.5 mg Oral BID   Chlorhexidine  Gluconate Cloth  6 each Topical Daily   cyanocobalamin   1,000 mcg Oral Daily   feeding supplement  1 Container Oral TID BM   feeding supplement (K[employer] STANDARD 1.4)  325 mL Oral BID BM   liver oil-zinc  oxide   Topical BID   loperamide   4 mg Oral TID WC & HS   megestrol   40 mg Oral Daily   metoprolol  succinate  12.5 mg Oral Daily   multivitamin with minerals  1 tablet Oral Daily   nystatin    Topical BID   pantoprazole   40 mg Oral Daily   polycarbophil  625 mg Oral BID   sodium chloride  flush  10-40 mL Intracatheter Q12H   sodium chloride  flush  3 mL Intravenous Q12H   sodium chloride  flush  5 mL Intracatheter Q8H   sodium chloride  flush  5 mL Intracatheter Q8H   tamsulosin   0.4 mg Oral QPC supper     Studies/Results: No results found.  Assessment: s/p Procedure(s): CLOSURE, COLOSTOMY, ROBOT-ASSISTED -ROBOTIC RECTOSIGMOID RESECTION (LAR) -TAKEDOWN OF END COLOSTOMY WITH ANASTOMOSIS -RESECTION OF SMALL INTESTINE WITH ANASTOMOSIS -SMALL BOWEL REPAIR -LYSIS OF ADHESIONS x 115 MINUTES (66% OF CASE),  -INTRAOPERATIVE ASSESSMENT OF TISSUE VASCULAR PERFUSION USING ICG (indocyanine green ) IMMUNOFLUORESCENCE,  -TRANSVERSUS ABDOMINIS PLANE (TAP) BLOCK - BILATERAL LYSIS, ADHESIONS, LAPAROSCOPIC SIGMOIDOSCOPY, FLEXIBLE CYSTOSCOPY WITH INDOCYANINE GREEN  IMAGING (ICG) Patient Active Problem List   Diagnosis Date Noted   Hypokalemia 05/15/2024   Protein-calorie malnutrition, severe 05/12/2024   History of anemia 05/08/2024   Pre-diabetes 05/08/2024   Diverticular disease 05/08/2024   Premature atrial contraction 04/08/2024   Mixed hyperlipidemia 04/08/2024   Essential hypertension 04/07/2024   Diverticular disease of left colon 04/07/2024   History of DVT (deep vein thrombosis) 04/07/2024   History of pulmonary embolus (PE) 04/07/2024   Presence of IVC filter 04/07/2024   Stricture of sigmoid colon (HCC) 04/07/2024   Irritant contact dermatitis associated with fecal stoma 03/10/2024   Pancreatic cyst 01/02/2024   Hypoalbuminemia 01/02/2024   Acquired renal cyst of right kidney 01/02/2024   Fatty liver 01/02/2024   Ambulates with cane 01/02/2024   Iron deficiency anemia due to chronic blood loss 10/31/2023  Acute pulmonary embolism (HCC) 10/31/2023   Hyponatremia 10/31/2023   Acute urinary retention 10/31/2023   Malnutrition of moderate degree 10/17/2023   High risk medication use 10/16/2023   Pain 10/16/2023   Pressure injury of skin 10/16/2023   Left leg DVT (HCC) 10/05/2023   ABLA (acute blood loss anemia) 10/05/2023   Palliative care encounter 10/05/2023   Counseling and coordination of care 10/05/2023   Need for emotional support 10/05/2023   Acute colitis 09/27/2023    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

## 2024-05-31 NOTE — Progress Notes (Signed)
 PROGRESS NOTE    Elizabeth Odom  FMW:[id number] DOB: 1941/10/21 DOA: 05/08/2024 PCP: Jesus Bernardino MATSU, MD   Brief Narrative: Elizabeth Odom is a 83 y.o. female with a history of atrial fibrillation, DVT, PE, anemia, PACs, hypertension, mild cognitive impairment, large bowel obstruction.  Patient initially presented for surgical management involving history of diverticulitis with planned ostomy takedown with anastomosis. During hospitalization, patient developed altered mental status secondary to polypharmacy in addition to atrial fibrillation with RVR. Cardiology consulted for recommendation and management with subsequent improvement in RVR. Ongoing care by general surgery.   Assessment and Plan:  Persistent atrial fibrillation/flutter Cardiology consulted for management.  Medication adjusted to metoprolol  XL 25 mg daily with recommendation for Eliquis  when okay per surgery. Toprol -XL decreased to 12.5 mg daily -Continue Toprol -XL 12.5 mg daily -Continue Eliquis   History of diverticulitis with perforation and abscess with colostomy status post ostomy takedown Ileus Per primary.  Infectious diseases consulted and are planning for management with multi week course of antibiotics.  Urine culture (6/12) significant for Pseudomonas, MRSA, Enterococcus faecalis, Candida albicans.  Drain culture (6/17) significant for rare Pseudomonas aeruginosa.  Ongoing antibiotics per infectious disease.  Patient now on daptomycin , micafungin , Zosyn   Acute blood loss anemia Secondary to surgery.  Hemoglobin down to a low of 6.1 this admission.  Patient received a total of 4 units of PRBC via transfusion this admission.  Hemoglobin currently stable.  AKI Mild. Resolved.  Hypoalbuminemia Noted. Associated poor nutrition.  Leukocytosis In setting of surgery. Resolved.  Thrombocytosis Noted. Likely reactive. Mildly elevated and stable.  Hyponatremia Mild.  Syncope Syncopal episode occurred during  hospitalization while patient was on commode.  Concern for possible vasovagal versus hypotension etiology.  Transthoracic echo obtained with evidence of preserved LVEF of 60 to 65% in addition to no aortic stenosis.  Acute urinary retention Foley inserted on 6/4. Flomax  started. Foley removed on 6/13. -Continue Flomax   Acute metabolic encephalopathy Delirium Resolved.  Generalized weakness Physical therapy is ordered already.  Discussed with patient that is important for her to participate as much as possible with therapy to improve her strength.   DVT prophylaxis: Per primary Code Status:   Code Status: Full Code Family Communication: None at bedside Disposition Plan: Per primary   Consultants:    Procedures:    Antimicrobials:     Subjective: Complaints about staff responses. Medically, no concerns. Patient reports having no appetite as the reason she is not eating.  Objective: BP 132/72 (BP Location: Left Arm)   Pulse 92   Temp 97.8 F (36.6 C) (Oral)   Resp 15   Ht 5' 5 (1.651 m)   Wt 58.8 kg   SpO2 96%   BMI 21.57 kg/m   Examination:  General exam: Appears calm and comfortable Respiratory system: Clear to auscultation. Respiratory effort normal. Cardiovascular system: S1 & S2 heard, RRR. No murmurs, rubs, gallops or clicks. Gastrointestinal system: Abdomen is nondistended, soft and nontender. Normal bowel sounds heard. Central nervous system: Alert and oriented. No focal neurological deficits. Psychiatry: Judgement and insight appear normal. Mood & affect appropriate.    Data Reviewed: I have personally reviewed following labs and imaging studies  CBC Lab Results  Component Value Date   WBC 6.9 05/29/2024   RBC 3.17 (L) 05/29/2024   HGB 9.0 (L) 05/29/2024   HCT 29.7 (L) 05/29/2024   MCV 93.7 05/29/2024   MCH 28.4 05/29/2024   PLT 502 (H) 05/29/2024   MCHC 30.3 05/29/2024   RDW 16.1 (  H) 05/29/2024   LYMPHSABS 0.7 05/09/2024   MONOABS 1.8 (H)  05/09/2024   EOSABS 0.0 05/09/2024   BASOSABS 0.0 05/09/2024     Last metabolic panel Lab Results  Component Value Date   NA 133 (L) 05/29/2024   K 3.9 05/29/2024   CL 100 05/29/2024   CO2 24 05/29/2024   BUN 20 05/29/2024   CREATININE 0.84 05/29/2024   GLUCOSE 112 (H) 05/29/2024   GFRNONAA >60 05/29/2024   GFRAA >60 04/10/2016   CALCIUM  8.1 (L) 05/29/2024   PHOS 3.5 05/29/2024   PROT 6.6 05/29/2024   ALBUMIN  1.6 (L) 05/29/2024   LABGLOB 2.9 04/08/2024   BILITOT 0.7 05/29/2024   ALKPHOS 198 (H) 05/29/2024   AST 18 05/29/2024   ALT 26 05/29/2024   ANIONGAP 9 05/29/2024    GFR: Estimated Creatinine Clearance: 45.7 mL/min (by C-G formula based on SCr of 0.84 mg/dL).  Recent Results (from the past 240 hours)  MIC (1 Drug)-Abdominal drain abscess; 05/22/2024; Abdomen; MRSA; Daptomycin      Status: Abnormal   Collection Time: 05/22/24  1:54 PM   Specimen: Abdomen  Result Value Ref Range Status   Min Inhibitory Conc (1 Drug) Preliminary report (A)  Final    Comment: (NOTE) Performed At: [facility] [address] [place], [place] [id number] Jennette Shorter MD Ey:[id number]    Source ABSCESS  Final    Comment: Performed at [facility], [address], [place], [place] [address]  MIC Result     Status: Abnormal   Collection Time: 05/22/24  1:54 PM  Result Value Ref Range Status   Result 1 (MIC) Comment (A)  Final    Comment: (NOTE) Methicillin - resistant Staphylococcus aureus Identification performed by account, not confirmed by this laboratory. Performed At: [facility] [address] [place], [place] [id number] Jennette Shorter MD Ey:[id number]   Aerobic/Anaerobic Culture w Gram Stain (surgical/deep wound)     Status: None (Preliminary result)   Collection Time: 05/22/24  3:59 PM   Specimen: Abdomen  Result Value Ref Range Status   Specimen Description   Final    ABDOMEN Performed at [facility], [address]  [place], [place] [address]    Special Requests   Final    ABDOMINAL DRAIN Performed at [facility], [address] [place], [place] [address]    Gram Stain   Final    ABUNDANT WBC PRESENT, PREDOMINANTLY PMN RARE GRAM POSITIVE COCCI RARE YEAST WITH PSEUDOHYPHAE    Culture   Final    MODERATE PSEUDOMONAS AERUGINOSA MODERATE STAPHYLOCOCCUS AUREUS SUSCEPTIBILITIES PERFORMED ON PREVIOUS CULTURE WITHIN THE LAST 5 DAYS. MODERATE CANDIDA ALBICANS Sent to [facility] for further susceptibility testing. NO ANAEROBES ISOLATED Performed at [facility], [address], [place], [place] [address]    Report Status PENDING  Incomplete   Organism ID, Bacteria PSEUDOMONAS AERUGINOSA  Final      Susceptibility   Pseudomonas aeruginosa - MIC*    CEFTAZIDIME 4 SENSITIVE Sensitive     CIPROFLOXACIN <=0.25 SENSITIVE Sensitive     GENTAMICIN 4 SENSITIVE Sensitive     IMIPENEM 2 SENSITIVE Sensitive     PIP/TAZO <=4 SENSITIVE Sensitive ug/mL    CEFEPIME 2 SENSITIVE Sensitive     * MODERATE PSEUDOMONAS AERUGINOSA  Aerobic/Anaerobic Culture w Gram Stain (surgical/deep wound)     Status: None   Collection Time: 05/22/24  3:59 PM   Specimen: Back  Result Value Ref Range Status   Specimen  Description   Final    BACK Performed at [facility], [address], [place], [place] [address]    Special Requests   Final    TRANSGLUTEAL PERCUTANEOUS IR DRAIN Performed at [facility], [address] [place], [place] [address]    Gram Stain   Final    NO WBC SEEN ABUNDANT GRAM NEGATIVE RODS RARE GRAM POSITIVE COCCI IN PAIRS RARE BUDDING YEAST SEEN    Culture   Final    ABUNDANT PSEUDOMONAS AERUGINOSA ABUNDANT METHICILLIN RESISTANT STAPHYLOCOCCUS AUREUS ABUNDANT ENTEROCOCCUS FAECALIS ABUNDANT CANDIDA ALBICANS NO ANAEROBES ISOLATED Sent to [facility] for further susceptibility testing. Performed at [facility], [address]  [place], [place] [address]    Report Status 05/27/2024 FINAL  Final   Organism ID, Bacteria PSEUDOMONAS AERUGINOSA  Final   Organism ID, Bacteria METHICILLIN RESISTANT STAPHYLOCOCCUS AUREUS  Final   Organism ID, Bacteria ENTEROCOCCUS FAECALIS  Final      Susceptibility   Enterococcus faecalis - MIC*    AMPICILLIN <=2 SENSITIVE Sensitive     VANCOMYCIN  1 SENSITIVE Sensitive     GENTAMICIN SYNERGY SENSITIVE Sensitive     * ABUNDANT ENTEROCOCCUS FAECALIS   Methicillin resistant staphylococcus aureus - MIC*    CIPROFLOXACIN >=8 RESISTANT Resistant     ERYTHROMYCIN  >=8 RESISTANT Resistant     GENTAMICIN <=0.5 SENSITIVE Sensitive     OXACILLIN >=4 RESISTANT Resistant     TETRACYCLINE <=1 SENSITIVE Sensitive     VANCOMYCIN  1 SENSITIVE Sensitive     TRIMETH/SULFA >=320 RESISTANT Resistant     CLINDAMYCIN <=0.25 SENSITIVE Sensitive     RIFAMPIN <=0.5 SENSITIVE Sensitive     Inducible Clindamycin NEGATIVE Sensitive     LINEZOLID 2 SENSITIVE Sensitive     * ABUNDANT METHICILLIN RESISTANT STAPHYLOCOCCUS AUREUS   Pseudomonas aeruginosa - MIC*    CEFTAZIDIME 4 SENSITIVE Sensitive     CIPROFLOXACIN <=0.25 SENSITIVE Sensitive     GENTAMICIN 4 SENSITIVE Sensitive     IMIPENEM 2 SENSITIVE Sensitive     PIP/TAZO <=4 SENSITIVE Sensitive ug/mL    CEFEPIME 2 SENSITIVE Sensitive     * ABUNDANT PSEUDOMONAS AERUGINOSA  Aerobic/Anaerobic Culture w Gram Stain (surgical/deep wound)     Status: None (Preliminary result)   Collection Time: 05/27/24  5:19 PM   Specimen: Abscess  Result Value Ref Range Status   Specimen Description   Final    ABSCESS ABDOMEN Performed at [facility], [address] [place], [place] [address]    Special Requests   Final    NONE Performed at [facility], [address] [place], [place] [address]    Gram Stain   Final    ABUNDANT WBC PRESENT, PREDOMINANTLY PMN NO ORGANISMS SEEN Performed at [facility], [address],  [place], [place] [address]    Culture   Final    RARE PSEUDOMONAS AERUGINOSA RARE CANDIDA ALBICANS NO ANAEROBES ISOLATED; CULTURE IN PROGRESS FOR 5 DAYS    Report Status PENDING  Incomplete   Organism ID, Bacteria PSEUDOMONAS AERUGINOSA  Final      Susceptibility   Pseudomonas aeruginosa - MIC*    CEFTAZIDIME 4 SENSITIVE Sensitive     CIPROFLOXACIN <=0.25 SENSITIVE Sensitive     GENTAMICIN 4 SENSITIVE Sensitive     IMIPENEM 2 SENSITIVE Sensitive     PIP/TAZO <=4 SENSITIVE Sensitive ug/mL    CEFEPIME 2 SENSITIVE Sensitive     * RARE PSEUDOMONAS AERUGINOSA  Radiology Studies: No results found.     LOS: 23 days    Elgin Lam, MD [facility] 05/31/2024, 2:07 PM   If 7PM-7AM, please contact night-coverage [email or web address]

## 2024-06-01 DIAGNOSIS — K5790 Diverticulosis of intestine, part unspecified, without perforation or abscess without bleeding: Secondary | ICD-10-CM | POA: Diagnosis not present

## 2024-06-01 LAB — AEROBIC/ANAEROBIC CULTURE W GRAM STAIN (SURGICAL/DEEP WOUND)

## 2024-06-01 MED ORDER — TRAVASOL 10 % IV SOLN
INTRAVENOUS | Status: AC
  Filled 2024-06-01: qty 537.6

## 2024-06-01 NOTE — Progress Notes (Signed)
 PHARMACY - TOTAL PARENTERAL NUTRITION CONSULT NOTE   Indication: Prolonged ileus  Patient Measurements: Height: 5' 5 (165.1 cm) Weight: 58.8 kg (129 lb 10.1 oz) IBW/kg (Calculated) : 57 TPN AdjBW (KG): 61.3 Body mass index is 21.57 kg/m.  Assessment: 69 yoF with history of diverticulitis with perforation and abscess. 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 She requested ostomy takedown and underwent LOA, colostomy takedown, small bowel resection with anastomosis on 5/29. Pharmacy is consulted to dose TPN starting 6/2 for postop ileus.  Now with anastomotic leak complicated by multiple-intraabdominal abscesses.  Glucose / Insulin : no Hx DM, A1c 5.1%. BG goal 100-180. -CBG/SSI discontinued on 6/11 Electrolytes: none today, previously Na (133) slightly low on max Na concentration -All others WNL, including CorrCa (10) Hepatic: LFTs, Tbili, TG WNL.  -Albumin  low. Alk Phos trending down slowly I/O:  -Negligible PO intake -No UOP or stool volume recorded (LBM 6/22), drain output ~ 50 ml which likely also accounts for scheduled flushes GI Imaging:  -6/16 CT: Similar appearance at the rectal anastomosis concerning for dehiscence -6/9 CT: Dempsey dehiscence along the rectal anastomosis, free air and multiple fluid collections GI Surgeries / Procedures: -5/29: LAR, SBR, colostomy takedown, LOA -6/10 drains placed x 2 in IR (midline abd, transgluteal) -6/17: Replacement of drainage catheter by IR  Central access: Double lumen PICC placed 6/2 TPN start date: 6/2  Nutritional Goals: Goal TPN rate is 70 mL/hr (provides 94 g of protein and 1680 kcals per day)  RD Assessment:  Estimated Needs Total Energy Estimated Needs: 1600-1750 kcals Total Protein Estimated Needs: 80-95 grams Total Fluid Estimated Needs: >/= 1.6L  Current Nutrition:  -TPN (weaned to  half-rate) -Diet: Regular  Multiple calorie counts this admission. Not meeting caloric needs. Refusing all supplements; hesitant to try many foods. Essentially eating 0% of all meals. Unlikely to meet caloric needs, recommendation for consideration of Cortrak placement by dietitian.  Plan:  Continue TPN at 40 mL/hr (provides 54 g protein, 960 kcal) Electrolytes in TPN: No change Na 150 mEq/L (max) K 50 mEq/L Ca 5 mEq/L Mg 5 mEq/L Phos 15 mmol/L Cl:Ac 1:1 No CBG checks/SSI  MVI PO Monitor TPN labs on Mon/Thurs, and as needed.   [name], PharmD, BCPS Clinical Pharmacist 06/01/2024 9:29 AM

## 2024-06-01 NOTE — Progress Notes (Signed)
 24 Days Post-Op  Subjective: Feels about the same.  Doesn't have an apettite   Objective: Vital signs in last 24 hours: Temp:  [97.8 F (36.6 C)-99.8 F (37.7 C)] 99.8 F (37.7 C) (06/22 0524) Pulse Rate:  [92-102] 102 (06/22 0524) Resp:  [17] 17 (06/22 0524) BP: (101-132)/(58-72) 124/67 (06/22 0524) SpO2:  [95 %-96 %] 95 % (06/22 0524)   Intake/Output from previous day: 06/21 0701 - 06/22 0700 In: 787.3 [I.V.:515.3; IV Piggyback:267] Out: 52 [Drains:52] Intake/Output this shift: No intake/output data recorded.   General appearance: alert and cooperative GI: soft, non-distended  Incision: healing well  Lab Results:  No results for input(s): WBC, HGB, HCT, PLT in the last 72 hours.  BMET No results for input(s): NA, K, CL, CO2, GLUCOSE, BUN, CREATININE, CALCIUM  in the last 72 hours.  PT/INR No results for input(s): LABPROT, INR in the last 72 hours. ABG No results for input(s): PHART, HCO3 in the last 72 hours.  Invalid input(s): PCO2, PO2  MEDS, Scheduled  acetaminophen   500 mg Oral TID WC & HS   apixaban   2.5 mg Oral BID   cyanocobalamin   1,000 mcg Oral Daily   feeding supplement  1 Container Oral TID BM   feeding supplement (KATE [employer]MS STANDARD 1.4)  325 mL Oral BID BM   liver oil-zinc  oxide   Topical BID   loperamide   4 mg Oral TID WC & HS   megestrol   40 mg Oral Daily   metoprolol  succinate  12.5 mg Oral Daily   multivitamin with minerals  1 tablet Oral Daily   nystatin    Topical BID   pantoprazole   40 mg Oral Daily   polycarbophil  625 mg Oral BID   sodium chloride  flush  10-40 mL Intracatheter Q12H   sodium chloride  flush  3 mL Intravenous Q12H   sodium chloride  flush  5 mL Intracatheter Q8H   sodium chloride  flush  5 mL Intracatheter Q8H   tamsulosin   0.4 mg Oral QPC supper    Studies/Results: No results found.  Assessment: s/p Procedure(s): CLOSURE, COLOSTOMY, ROBOT-ASSISTED -ROBOTIC RECTOSIGMOID RESECTION  (LAR) -TAKEDOWN OF END COLOSTOMY WITH ANASTOMOSIS -RESECTION OF SMALL INTESTINE WITH ANASTOMOSIS -SMALL BOWEL REPAIR -LYSIS OF ADHESIONS x 115 MINUTES (66% OF CASE),  -INTRAOPERATIVE ASSESSMENT OF TISSUE VASCULAR PERFUSION USING ICG (indocyanine green ) IMMUNOFLUORESCENCE,  -TRANSVERSUS ABDOMINIS PLANE (TAP) BLOCK - BILATERAL LYSIS, ADHESIONS, LAPAROSCOPIC SIGMOIDOSCOPY, FLEXIBLE CYSTOSCOPY WITH INDOCYANINE GREEN  IMAGING (ICG) Patient Active Problem List   Diagnosis Date Noted   Hypokalemia 05/15/2024   Protein-calorie malnutrition, severe 05/12/2024   History of anemia 05/08/2024   Pre-diabetes 05/08/2024   Diverticular disease 05/08/2024   Premature atrial contraction 04/08/2024   Mixed hyperlipidemia 04/08/2024   Essential hypertension 04/07/2024   Diverticular disease of left colon 04/07/2024   History of DVT (deep vein thrombosis) 04/07/2024   History of pulmonary embolus (PE) 04/07/2024   Presence of IVC filter 04/07/2024   Stricture of sigmoid colon (HCC) 04/07/2024   Irritant contact dermatitis associated with fecal stoma 03/10/2024   Pancreatic cyst 01/02/2024   Hypoalbuminemia 01/02/2024   Acquired renal cyst of right kidney 01/02/2024   Fatty liver 01/02/2024   Ambulates with cane 01/02/2024   Iron deficiency anemia due to chronic blood loss 10/31/2023   Acute pulmonary embolism (HCC) 10/31/2023   Hyponatremia 10/31/2023   Acute urinary retention 10/31/2023   Malnutrition of moderate degree 10/17/2023   High risk medication use 10/16/2023   Pain 10/16/2023   Pressure injury of skin 10/16/2023  Left leg DVT (HCC) 10/05/2023   ABLA (acute blood loss anemia) 10/05/2023   Palliative care encounter 10/05/2023   Counseling and coordination of care 10/05/2023   Need for emotional support 10/05/2023   Acute colitis 09/27/2023   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

## 2024-06-01 NOTE — Progress Notes (Signed)
 PROGRESS NOTE    Elizabeth Odom  FMW:[id number] DOB: [birth year] DOA: 05/08/2024 PCP: Elizabeth Odom MATSU, MD   Brief Narrative: Elizabeth Odom is a 83 y.o. female with a history of atrial fibrillation, DVT, PE, anemia, PACs, hypertension, mild cognitive impairment, large bowel obstruction.  Patient initially presented for surgical management involving history of diverticulitis with planned ostomy takedown with anastomosis. During hospitalization, patient developed altered mental status secondary to polypharmacy in addition to atrial fibrillation with RVR. Cardiology consulted for recommendation and management with subsequent improvement in RVR. Ongoing care by general surgery.   Assessment and Plan:  Persistent atrial fibrillation/flutter Cardiology consulted for management.  Medication adjusted to metoprolol  XL 25 mg daily with recommendation for Eliquis  when okay per surgery. Toprol -XL decreased to 12.5 mg daily -Continue Toprol -XL 12.5 mg daily -Continue Eliquis   History of diverticulitis with perforation and abscess with colostomy status post ostomy takedown Ileus Per primary.  Infectious diseases consulted and are planning for management with multi week course of antibiotics.  Urine culture (6/12) significant for Pseudomonas, MRSA, Enterococcus faecalis, Candida albicans.  Drain culture (6/17) significant for rare Pseudomonas aeruginosa.  Ongoing antibiotics per infectious disease.  Patient now on daptomycin , micafungin , Zosyn   Acute blood loss anemia Secondary to surgery.  Hemoglobin down to a low of 6.1 this admission.  Patient received a total of 4 units of PRBC via transfusion this admission.  Hemoglobin currently stable.  AKI Mild. Resolved.  Hypoalbuminemia Noted. Associated poor nutrition.  Leukocytosis In setting of surgery. Resolved.  Thrombocytosis Noted. Likely reactive. Mildly elevated and stable.  Hyponatremia Mild.  Syncope Syncopal episode occurred during  hospitalization while patient was on commode.  Concern for possible vasovagal versus hypotension etiology.  Transthoracic echo obtained with evidence of preserved LVEF of 60 to 65% in addition to no aortic stenosis.  Acute urinary retention Foley inserted on 6/4. Flomax  started. Foley removed on 6/13. -Continue Flomax   Acute metabolic encephalopathy Delirium Resolved.  Generalized weakness Physical therapy is ordered already.  Discussed with patient that is important for her to participate as much as possible with therapy to improve her strength.   DVT prophylaxis: Per primary Code Status:   Code Status: Full Code Family Communication: None at bedside Disposition Plan: Per primary   Consultants:    Procedures:    Antimicrobials:     Subjective: Lost her phone. Concerned about her clinical course and what are her next steps.  Objective: BP 124/67 (BP Location: Left Arm)   Pulse (!) 102   Temp 99.8 F (37.7 C) (Oral)   Resp 17   Ht 5' 5 (1.651 m)   Wt 58.8 kg   SpO2 95%   BMI 21.57 kg/m   Examination:  General exam: Appears calm and comfortable Respiratory system: Respiratory effort normal.   Data Reviewed: I have personally reviewed following labs and imaging studies  CBC Lab Results  Component Value Date   WBC 6.9 05/29/2024   RBC 3.17 (L) 05/29/2024   HGB 9.0 (L) 05/29/2024   HCT 29.7 (L) 05/29/2024   MCV 93.7 05/29/2024   MCH 28.4 05/29/2024   PLT 502 (H) 05/29/2024   MCHC 30.3 05/29/2024   RDW 16.1 (H) 05/29/2024   LYMPHSABS 0.7 05/09/2024   MONOABS 1.8 (H) 05/09/2024   EOSABS 0.0 05/09/2024   BASOSABS 0.0 05/09/2024     Last metabolic panel Lab Results  Component Value Date   NA 133 (L) 05/29/2024   K 3.9 05/29/2024   CL 100 05/29/2024  CO2 24 05/29/2024   BUN 20 05/29/2024   CREATININE 0.84 05/29/2024   GLUCOSE 112 (H) 05/29/2024   GFRNONAA >60 05/29/2024   GFRAA >60 04/10/2016   CALCIUM  8.1 (L) 05/29/2024   PHOS 3.5  05/29/2024   PROT 6.6 05/29/2024   ALBUMIN  1.6 (L) 05/29/2024   LABGLOB 2.9 04/08/2024   BILITOT 0.7 05/29/2024   ALKPHOS 198 (H) 05/29/2024   AST 18 05/29/2024   ALT 26 05/29/2024   ANIONGAP 9 05/29/2024    GFR: Estimated Creatinine Clearance: 45.7 mL/min (by C-G formula based on SCr of 0.84 mg/dL).  Recent Results (from the past 240 hours)  MIC (1 Drug)-Abdominal drain abscess; 05/22/2024; Abdomen; MRSA; Daptomycin      Status: Abnormal   Collection Time: 05/22/24  1:54 PM   Specimen: Abdomen  Result Value Ref Range Status   Min Inhibitory Conc (1 Drug) Preliminary report (A)  Final    Comment: (NOTE) Performed At: [facility] [address] [place], [place] [id number] Jennette Shorter MD Ey:[id number]    Source ABSCESS  Final    Comment: Performed at [facility], [address] [place], [place] [address]  MIC Result     Status: Abnormal   Collection Time: 05/22/24  1:54 PM  Result Value Ref Range Status   Result 1 (MIC) Comment (A)  Final    Comment: (NOTE) Methicillin - resistant Staphylococcus aureus Identification performed by account, not confirmed by this laboratory. Performed At: [facility] [address] [place], [place] [id number] Jennette Shorter MD Ey:[id number]   Aerobic/Anaerobic Culture w Gram Stain (surgical/deep wound)     Status: None (Preliminary result)   Collection Time: 05/22/24  3:59 PM   Specimen: Abdomen  Result Value Ref Range Status   Specimen Description   Final    ABDOMEN Performed at [facility], [address] [place], [place] [address]    Special Requests   Final    ABDOMINAL DRAIN Performed at [facility], [address], [place], [place] [address]    Gram Stain   Final    ABUNDANT WBC PRESENT, PREDOMINANTLY PMN RARE GRAM POSITIVE COCCI RARE YEAST WITH PSEUDOHYPHAE    Culture   Final    MODERATE PSEUDOMONAS AERUGINOSA MODERATE STAPHYLOCOCCUS  AUREUS SUSCEPTIBILITIES PERFORMED ON PREVIOUS CULTURE WITHIN THE LAST 5 DAYS. MODERATE CANDIDA ALBICANS Sent to [facility] for further susceptibility testing. NO ANAEROBES ISOLATED Performed at [facility], [address], [place], [place] [address]    Report Status PENDING  Incomplete   Organism ID, Bacteria PSEUDOMONAS AERUGINOSA  Final      Susceptibility   Pseudomonas aeruginosa - MIC*    CEFTAZIDIME 4 SENSITIVE Sensitive     CIPROFLOXACIN <=0.25 SENSITIVE Sensitive     GENTAMICIN 4 SENSITIVE Sensitive     IMIPENEM 2 SENSITIVE Sensitive     PIP/TAZO <=4 SENSITIVE Sensitive ug/mL    CEFEPIME 2 SENSITIVE Sensitive     * MODERATE PSEUDOMONAS AERUGINOSA  Aerobic/Anaerobic Culture w Gram Stain (surgical/deep wound)     Status: None   Collection Time: 05/22/24  3:59 PM   Specimen: Back  Result Value Ref Range Status   Specimen Description   Final    BACK Performed at [facility], [address], [place], [place] [address]    Special Requests   Final    TRANSGLUTEAL PERCUTANEOUS IR DRAIN Performed at [facility], [address], [place], [place] [address]    Gram Stain  Final    NO WBC SEEN ABUNDANT GRAM NEGATIVE RODS RARE GRAM POSITIVE COCCI IN PAIRS RARE BUDDING YEAST SEEN    Culture   Final    ABUNDANT PSEUDOMONAS AERUGINOSA ABUNDANT METHICILLIN RESISTANT STAPHYLOCOCCUS AUREUS ABUNDANT ENTEROCOCCUS FAECALIS ABUNDANT CANDIDA ALBICANS NO ANAEROBES ISOLATED Sent to [facility] for further susceptibility testing. Performed at [facility], [address] [place], [place] [address]    Report Status 05/27/2024 FINAL  Final   Organism ID, Bacteria PSEUDOMONAS AERUGINOSA  Final   Organism ID, Bacteria METHICILLIN RESISTANT STAPHYLOCOCCUS AUREUS  Final   Organism ID, Bacteria ENTEROCOCCUS FAECALIS  Final      Susceptibility   Enterococcus faecalis - MIC*    AMPICILLIN <=2 SENSITIVE Sensitive     VANCOMYCIN  1 SENSITIVE  Sensitive     GENTAMICIN SYNERGY SENSITIVE Sensitive     * ABUNDANT ENTEROCOCCUS FAECALIS   Methicillin resistant staphylococcus aureus - MIC*    CIPROFLOXACIN >=8 RESISTANT Resistant     ERYTHROMYCIN  >=8 RESISTANT Resistant     GENTAMICIN <=0.5 SENSITIVE Sensitive     OXACILLIN >=4 RESISTANT Resistant     TETRACYCLINE <=1 SENSITIVE Sensitive     VANCOMYCIN  1 SENSITIVE Sensitive     TRIMETH/SULFA >=320 RESISTANT Resistant     CLINDAMYCIN <=0.25 SENSITIVE Sensitive     RIFAMPIN <=0.5 SENSITIVE Sensitive     Inducible Clindamycin NEGATIVE Sensitive     LINEZOLID 2 SENSITIVE Sensitive     * ABUNDANT METHICILLIN RESISTANT STAPHYLOCOCCUS AUREUS   Pseudomonas aeruginosa - MIC*    CEFTAZIDIME 4 SENSITIVE Sensitive     CIPROFLOXACIN <=0.25 SENSITIVE Sensitive     GENTAMICIN 4 SENSITIVE Sensitive     IMIPENEM 2 SENSITIVE Sensitive     PIP/TAZO <=4 SENSITIVE Sensitive ug/mL    CEFEPIME 2 SENSITIVE Sensitive     * ABUNDANT PSEUDOMONAS AERUGINOSA  Aerobic/Anaerobic Culture w Gram Stain (surgical/deep wound)     Status: None (Preliminary result)   Collection Time: 05/27/24  5:19 PM   Specimen: Abscess  Result Value Ref Range Status   Specimen Description   Final    ABSCESS ABDOMEN Performed at [facility], [address] [place], [place] [address]    Special Requests   Final    NONE Performed at [facility], [address], [place], [place] [address]    Gram Stain   Final    ABUNDANT WBC PRESENT, PREDOMINANTLY PMN NO ORGANISMS SEEN Performed at [facility], [address] [place], [place] [address]    Culture   Final    RARE PSEUDOMONAS AERUGINOSA RARE CANDIDA ALBICANS NO ANAEROBES ISOLATED; CULTURE IN PROGRESS FOR 5 DAYS    Report Status PENDING  Incomplete   Organism ID, Bacteria PSEUDOMONAS AERUGINOSA  Final      Susceptibility   Pseudomonas aeruginosa - MIC*    CEFTAZIDIME 4 SENSITIVE Sensitive     CIPROFLOXACIN <=0.25  SENSITIVE Sensitive     GENTAMICIN 4 SENSITIVE Sensitive     IMIPENEM 2 SENSITIVE Sensitive     PIP/TAZO <=4 SENSITIVE Sensitive ug/mL    CEFEPIME 2 SENSITIVE Sensitive     * RARE PSEUDOMONAS AERUGINOSA      Radiology Studies: No results found.     LOS: 24 days    Elgin Lam, MD [facility] 06/01/2024, 9:59 AM   If 7PM-7AM, please contact night-coverage [email or web address]

## 2024-06-02 ENCOUNTER — Inpatient Hospital Stay (HOSPITAL_COMMUNITY)

## 2024-06-02 DIAGNOSIS — K5790 Diverticulosis of intestine, part unspecified, without perforation or abscess without bleeding: Secondary | ICD-10-CM | POA: Diagnosis not present

## 2024-06-02 LAB — COMPREHENSIVE METABOLIC PANEL WITH GFR
ALT: 26 U/L (ref 0–44)
AST: 25 U/L (ref 15–41)
Albumin: 1.6 g/dL — ABNORMAL LOW (ref 3.5–5.0)
Alkaline Phosphatase: 183 U/L — ABNORMAL HIGH (ref 38–126)
Anion gap: 8 (ref 5–15)
BUN: 18 mg/dL (ref 8–23)
CO2: 23 mmol/L (ref 22–32)
Calcium: 8.1 mg/dL — ABNORMAL LOW (ref 8.9–10.3)
Chloride: 101 mmol/L (ref 98–111)
Creatinine, Ser: 0.71 mg/dL (ref 0.44–1.00)
GFR, Estimated: >60 mL/min (ref >60)
Glucose, Bld: 105 mg/dL — ABNORMAL HIGH (ref 70–99)
Potassium: 3.9 mmol/L (ref 3.5–5.1)
Sodium: 132 mmol/L — ABNORMAL LOW (ref 135–145)
Total Bilirubin: 0.9 mg/dL (ref 0.0–1.2)
Total Protein: 7.1 g/dL (ref 6.5–8.1)

## 2024-06-02 LAB — SURGICAL PCR SCREEN
MRSA, PCR: POSITIVE — AB
Staphylococcus aureus: POSITIVE — AB

## 2024-06-02 LAB — CBC
HCT: 29.8 % — ABNORMAL LOW (ref 36.0–46.0)
Hemoglobin: 9 g/dL — ABNORMAL LOW (ref 12.0–15.0)
MCH: 27.9 pg (ref 26.0–34.0)
MCHC: 30.2 g/dL (ref 30.0–36.0)
MCV: 92.3 fL (ref 80.0–100.0)
Platelets: 424 10*3/uL — ABNORMAL HIGH (ref 150–400)
RBC: 3.23 MIL/uL — ABNORMAL LOW (ref 3.87–5.11)
RDW: 16.2 % — ABNORMAL HIGH (ref 11.5–15.5)
WBC: 9 10*3/uL (ref 4.0–10.5)
nRBC: 0 % (ref 0.0–0.2)

## 2024-06-02 LAB — PREALBUMIN
Prealbumin: 11 mg/dL — ABNORMAL LOW (ref 18–38)
Prealbumin: 9 mg/dL — ABNORMAL LOW (ref 18–38)

## 2024-06-02 LAB — TRIGLYCERIDES: Triglycerides: 137 mg/dL (ref <150)

## 2024-06-02 LAB — PHOSPHORUS: Phosphorus: 3.3 mg/dL (ref 2.5–4.6)

## 2024-06-02 LAB — MAGNESIUM: Magnesium: 1.8 mg/dL (ref 1.7–2.4)

## 2024-06-02 MED ORDER — TRAVASOL 10 % IV SOLN
INTRAVENOUS | Status: AC
  Filled 2024-06-02: qty 537.6

## 2024-06-02 MED ORDER — IOHEXOL 300 MG/ML  SOLN
100.0000 mL | Freq: Once | INTRAMUSCULAR | Status: AC | PRN
  Administered 2024-06-02: 100 mL via INTRAVENOUS

## 2024-06-02 NOTE — Progress Notes (Signed)
 PHARMACY - TOTAL PARENTERAL NUTRITION CONSULT NOTE   Indication: Prolonged ileus  Patient Measurements: Height: 5' 5 (165.1 cm) Weight: 58.8 kg (129 lb 10.1 oz) IBW/kg (Calculated) : 57 TPN AdjBW (KG): 61.3 Body mass index is 21.57 kg/m.  Assessment: 32 yoF with history of diverticulitis with perforation and abscess. 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 She requested ostomy takedown and underwent LOA, colostomy takedown, small bowel resection with anastomosis on 5/29. Pharmacy is consulted to dose TPN starting 6/2 for postop ileus.  Now with anastomotic leak complicated by multiple-intraabdominal abscesses.  Glucose / Insulin : no Hx DM, A1c 5.1%. BG goal 100-180. -CBG/SSI discontinued on 6/11 Electrolytes: Na (132) slightly low but stable on max Na concentration -All others WNL, including CorrCa (10) Hepatic: LFTs, Tbili, TG WNL.  -Albumin  low. Alk Phos trending down slowly I/O:  -Negligible PO intake, pt refusing to eat much per notes -No UOP or stool volume recorded (LBM 6/22), drain output ~ 100 ml which likely also accounts for scheduled flushes GI Imaging:  -6/16 CT: Similar appearance at the rectal anastomosis concerning for dehiscence -6/9 CT: Dempsey dehiscence along the rectal anastomosis, free air and multiple fluid collections GI Surgeries / Procedures: -5/29: LAR, SBR, colostomy takedown, LOA -6/10 drains placed x 2 in IR (midline abd, transgluteal) -6/17: Replacement of drainage catheter by IR  Central access: Double lumen PICC placed 6/2 TPN start date: 6/2  Nutritional Goals: Goal TPN rate is 70 mL/hr (provides 94 g of protein and 1680 kcals per day)  RD Assessment:  Estimated Needs Total Energy Estimated Needs: 1600-1750 kcals Total Protein Estimated Needs: 80-95 grams Total Fluid Estimated Needs: >/= 1.6L  Current Nutrition:  -TPN  (weaned to half-rate) -Diet: Regular  Multiple calorie counts this admission. Not meeting caloric needs. Refusing all supplements; hesitant to try many foods. Essentially eating 0% of all meals. Unlikely to meet caloric needs, recommendation for consideration of Cortrak placement by dietitian.  Plan:  Continue TPN at 40 mL/hr (provides 54 g protein, 960 kcal) Electrolytes in TPN: No change Na 150 mEq/L (max) K 50 mEq/L Ca 5 mEq/L Mg 5 mEq/L Phos 15 mmol/L Cl:Ac 1:1 No CBG checks/SSI  MVI PO Monitor TPN labs on Mon/Thurs, and as needed.   Britta Eva Na, PharmD, BCPS Clinical Pharmacist 06/02/2024 9:14 AM

## 2024-06-02 NOTE — Consult Note (Addendum)
 WOC Nurse requested for preoperative stoma site marking  Discussed surgical procedure and possible stoma creation with patient.  Explained role of the WOC nurse team.  Provided the patient with educational booklet and provided samples of pouching options. She denies any questions, no family members present.   Examined patient lying,  and sitting upright, in order to place the marking in the patient's visual field, away from any creases or abdominal contour issues and within the rectus muscle. Abd is very tight and distended, it is difficult to tell if there are any folds/or creases which will occur when this subsides.  Pt had previous surgery and does not have an umbilicus to use as a landmark.  A drainage tube is in place to use as a reference point.    Mark placed even with the drain and 7 cm to the right   Mark placed even with the drain and 4 cm to the left.   Patient's abdomen cleansed with CHG wipes at site markings, allowed to air dry prior to marking. Covered mark with thin film transparent dressing to preserve mark until date of surgery.  WOC Nurse team will follow up with patient after surgery for continued ostomy care and teaching if she receives an ostomy.  Thank-you,  Stephane Fought MSN, RN, CWOCN, [place], CNS [phone]

## 2024-06-02 NOTE — Progress Notes (Addendum)
 06/02/2024  Elizabeth Odom [id number] 08/01/1941  CARE TEAM: PCP: Jesus Bernardino MATSU, MD  Outpatient Care Team: Patient Care Team: Jesus Bernardino MATSU, MD as PCP - General (Internal Medicine) Elmira Newman PARAS, MD as PCP - Cardiology (Cardiology) [name], MD as Consulting Physician (Gastroenterology) Rubin Calamity, MD as Consulting Physician (General Surgery) [name], MD (Neurology) [name], MD as Consulting Physician (Urology) [name], MD as Consulting Physician (Cardiology) [name], MD as Consulting Physician (Colon and Rectal Surgery)  Inpatient Treatment Team: Treatment Team:  [name], MD Nikki Rams, Aliene, MD Drusilla Sabas RAMAN, MD [name], MD Bobbette Cartwright, MD Cheryle Page, MD Allred, Harman POUR, PA-C Mugweru, Jon, MD [name] LABOR, MD [name], MD Dennise Kingsley, MD Cesario Mylinda KIDD, RN Margery Katheryn LABOR, PTA Dorian Chiquita CROME, RN   Problem List:   Principal Problem:   Diverticular disease Active Problems:   A-fib [facility])   Restless leg   ABLA (acute blood loss anemia)   Need for emotional support   Acute urinary retention   Mixed hyperlipidemia   Essential hypertension   Diverticular disease of left colon   History of DVT (deep vein thrombosis)   History of pulmonary embolus (PE)   Pre-diabetes   Presence of IVC filter   Stricture of sigmoid colon (HCC)   Protein-calorie malnutrition, severe   Hypokalemia   05/08/2024  POST-OPERATIVE DIAGNOSIS:   COLOSTOMY FOR RESECTION, DESIRE FOR OSTOMY TAKEDOWN RECTAL STRICTURE HISTORY OF DIVERTICULITIS WITH PERFORATION & ABSCESS   PROCEDURE:   -ROBOTIC RECTOSIGMOID RESECTION (LAR) -TAKEDOWN OF END COLOSTOMY WITH ANASTOMOSIS -RESECTION OF SMALL INTESTINE WITH ANASTOMOSIS -SMALL BOWEL REPAIR -LYSIS OF ADHESIONS x 115 MINUTES (66% OF CASE),  -INTRAOPERATIVE ASSESSMENT OF TISSUE VASCULAR PERFUSION USING ICG (indocyanine green )  IMMUNOFLUORESCENCE,  -TRANSVERSUS ABDOMINIS PLANE (TAP) BLOCK - BILATERAL -FLEXIBLE SIGMOIDOSCOPY   SURGEON:  [name], MD  OR FINDINGS:  Very dense adhesions of small bowel especially to pelvis and retroperitoneum.  Extremely concrete dense adhesions with friable tissue near her old prior small bowel anastomosis and distal jejunum.  Required small bowel repair and required small bowel resection including old anastomosis given severe tissues.  Patient had significant fibrotic stricturing of rectal stump at the mid/distal rectal junction.  Could not be released up.  Ended up doing resection of rectal stump to mid/distal rectal junction.  No obvious metastatic disease on visceral parietal peritoneum or liver.   It is a 29mm EEA anastomosis ( distal descending colon  connected to mid/distal rectal junction.)  It rests 7 cm from the anal verge by rigid proctoscopy.  05/20/2024  Post procedural Dx: Post op abscess, multiple   Technically successful CT guided placed of a  10 Fr drainage catheter placement x 2  into the midline ventral and dorsal perirectal abscesses.     [name] LABOR, MD    Assessment [facility] Stay = 25 days) 25 Days Post-Op    Failure to thrive with delayed anastomotic leak despite TPN, antibiotics, drainage.   Plan:  I had a long discussion with the patient again.  She is not rallying with the drain replacement near the anastomosis like she did this 1st time.  Still having intermittent diarrhea.  She is not eating.  Her nutrition remains fair although prealbumin is better with the help of the TPN.    I think we have failed nonoperative management.  I think she requires fecal diversion.  Probably permanent.    I recommended  laparoscopic probable open abdominal exploration.  [place].  Evaluate the anastomosis.  If very small contained and I can patch it, can try to do diverting loop ileostomy in the hopes of turning around and taking the ostomy down in 6  months.  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  If there is evidence of ischemia or significant leak then convert to a permanent end colostomy.  She has been against surgery but agrees that she is not getting better and is considering it  She refuses to eat much.  I am leaning towards placing a feeding gastrostomy tube as well, so that I know that she can get full enteral nutrition and get off the TPN.  Hopefully could be temporary if she eats better and can get off it in a couple of months.  We will see.   I am going to call & discuss with her granddaughter as well who seems to understand this unfortunate/challenging situation even better.  The anatomy & physiology of the digestive tract was discussed.  The pathophysiology of perforation was discussed.  Differential diagnosis such as perforated ulcer or colon, etc was discussed.   Natural history risks without surgery such as death was discussed.  I recommended abdominal exploration to diagnose & treat the source of the problem.  Laparoscopic & open techniques were discussed.   Risks such as bleeding, infection, abscess, leak, reoperation, bowel resection, possible ostomy, injury to other organs, need for repair of tissues / organs, hernia, heart attack, death, and other risks were discussed.   The risks of no intervention will lead to serious problems including death.   I expressed a good likelihood that surgery will address the problem.    Goals of post-operative recovery were discussed as well.  We will work to minimize complications although risks in an emergent setting are high.   Questions were answered.  The patient expressed understanding & wishes to proceed with surgery.        CT scan abdomen pelvis to make sure there are no undrained abscesses and get a lay of the land.  Make sure were not missing anything.  Preoperative marking  for probable diverting loop ileostomy/diverting colostomy.  Help my plan if I can get a gastrostomy tube in there as well.    Growing numerous organisms including Pseudomonas, MRSA and Candida albicans.    Discussed with Dr. Dennise with infectious disease.  She suspects many of these organs are just colonization/contamination of the drain I do not represent necessarily any abscesses.  She is adjusting antibiotics.  Will see what the new drain shows.  She is leaning towards 4 weeks of antibiotics postop just in case.  Pain control - opioids make her rather sedated/groggy.  Continue gabapentin /acetaminophen /cyclobenzaprine   Severe malnutrition   TPN for severe protein malnutrition.    Retry calorie counts now that all the drains are done.  Will do half TPN for now.  Add Megace  -no improvement in appetite yet.  Prealbumin starting to climb in more normal range which is a guardedly hopeful sign.    History of urinary retention  Foley catheter out with no problems with urination now that pelvic collection under better control.    Continue tamsulosin    if fails then replace and leave for several weeks until can follow-up with urology as an outpatient.  Less likely now  No  rectal tubes.  Atrial fibrillation postop.  History of a prior event.    Cardiology signed off for now.  Reconsult as needed.  Stop telemetry for now  Restarted DOAC Eliquis  6/12 after prior hematochezia.  No strong evidence of that.  Follow hemoglobin.  Holding Eliquis  6/15 in anticipation of drain replacement 6/17, and restart 6/18  Rate controlled switch from amlodipine  to metoprolol .  Less tachycardic.  Blood pressure soft.  Back down 12.5 mg of metoprolol  and follow with PRN (as needed) breakthrough  -monitor electrolytes & replace as needed.  Keep K>4, Mg>2, Phos>3  -VTE prophylaxis- SCDs.  Anticoagulation as appropriate  -mobilize as tolerated to help recovery.  Enlist therapies in moderate/high risk  patients as appropriate.  Patient is refusing skilled facility or any rehab but is not getting up much.  Try to have strategies to help her get up and mobilize and actually work with physical therapy, or she is going to lose her argument.    I updated the patient's status to the patient and nurse.  Recommendations were made.  Questions were answered.  They expressed understanding & appreciation.  -Disposition: TBD - I think she needs SNF.  She has been very resistant, but she may reconsider      I reviewed last 24 h vitals and pain scores, last 48 h intake and output, last 24 h labs and trends, and last 24 h imaging results.  I have reviewed this patient's available data, including medical history, events of note, test results, etc as part of my evaluation.   A significant portion of that time was spent in counseling. Care during the described time interval was provided by me.  This care required moderate level of medical decision making.  06/02/2024    Subjective: (Chief complaint)  Patient staying in bed.  Does not feel like eating or drinking much.  Says she has no appetite.  No nausea or vomiting.  Soreness in lower abdomen unchanged.  Mostly irritated around drain.  No diarrhea last night.    Objective:  Vital signs:  Vitals:   06/01/24 0524 06/01/24 1332 06/01/24 2127 06/02/24 0556  BP: 124/67 113/62 (!) 133/57 (!) 126/56  Pulse: (!) 102 92 91 93  Resp: 17 18 17 17   Temp: 99.8 F (37.7 C) 98.4 F (36.9 C) 100.1 F (37.8 C) 99.1 F (37.3 C)  TempSrc: Oral Oral Oral Oral  SpO2: 95% 97% 96% 96%  Weight:      Height:        Last BM Date : 06/01/24  Intake/Output   Yesterday:  06/22 0701 - 06/23 0700 In: 1383.5 [P.O.:600; I.V.:502.5; IV Piggyback:261] Out: 100 [Drains:100] This shift:  No intake/output data recorded.  Bowel function:  Flatus: YES  BM:  YES -Play-Doh soft.  Drain:  Right lower quadrant drain: Scant seropurulent   Transgluteal: light  brown/thinly feculent  Physical Exam:  General: Alert.  More talkative.       Eyes: PERRL, normal EOM.  Sclera clear.  No icterus Neuro: CN II-XII intact w/o focal sensory/motor deficits.  All extremities.  No facial droop. Lymph: No head/neck/groin lymphadenopathy Psych:  No delerium/psychosis/paranoia.  Oriented x 4 HENT: Normocephalic, Mucus membranes moist.  No thrush.  Mild HOH Neck: Supple, No tracheal deviation.  No obvious thyromegaly.  Prefers to sit with head flexed and chin down.  Does not want to have her neck back.  Pillow adjusted. Chest: No pain to chest wall compression.  Good respiratory excursion.  No audible  wheezing CV:  Pulses intact.  Regular rhythm.  No major extremity edema MS: Normal AROM mjr joints.  No obvious deformity  Abdomen:  Soft.  Moderately distended.  Nontender.  No guarding.   Discomfort around drain.  Lower abdomen with some soreness  No evidence of peritonitis. Old ostomy incision with normal healing ridge.    GU: No Foley Rectal: Some erythema but improved overall.  No diarrhea in bed.   Ext:   No deformity.  No mjr edema.  No cyanosis Skin: No petechiae / purpurea.  No major sores.  Warm and dry    Results:   Cultures: Recent Results (from the past 720 hours)  Urine Culture     Status: Abnormal   Collection Time: 05/09/24  8:30 PM   Specimen: Urine, Clean Catch  Result Value Ref Range Status   Specimen Description   Final    URINE, CLEAN CATCH Performed at [facility], [address] [place], [place] [address]    Special Requests   Final    NONE Performed at [facility], [address], [place], [place] [address]    Culture (A)  Final    <10,000 COLONIES/mL INSIGNIFICANT GROWTH Performed at [facility], [address], [place], [place] [address]    Report Status 05/11/2024 FINAL  Final  MIC (1 Drug)-Abdominal drain abscess; 05/22/2024; Abdomen; MRSA; Daptomycin      Status: Abnormal    Collection Time: 05/22/24  1:54 PM   Specimen: Abdomen  Result Value Ref Range Status   Min Inhibitory Conc (1 Drug) Preliminary report (A)  Final    Comment: (NOTE) Performed At: [facility] [address] [place], [place] [id number] Jennette Shorter MD Ey:[id number]    Source ABSCESS  Final    Comment: Performed at [facility], [address], [place], [place] [address]  MIC Result     Status: Abnormal   Collection Time: 05/22/24  1:54 PM  Result Value Ref Range Status   Result 1 (MIC) Comment (A)  Final    Comment: (NOTE) Methicillin - resistant Staphylococcus aureus Identification performed by account, not confirmed by this laboratory. Performed At: [facility] [address] [place], [place] [id number] Jennette Shorter MD Ey:[id number]   Aerobic/Anaerobic Culture w Gram Stain (surgical/deep wound)     Status: None (Preliminary result)   Collection Time: 05/22/24  3:59 PM   Specimen: Abdomen  Result Value Ref Range Status   Specimen Description   Final    ABDOMEN Performed at [facility], [address] [place], [place] [address]    Special Requests   Final    ABDOMINAL DRAIN Performed at [facility], [address] [place], [place] [address]    Gram Stain   Final    ABUNDANT WBC PRESENT, PREDOMINANTLY PMN RARE GRAM POSITIVE COCCI RARE YEAST WITH PSEUDOHYPHAE    Culture   Final    MODERATE PSEUDOMONAS AERUGINOSA MODERATE STAPHYLOCOCCUS AUREUS SUSCEPTIBILITIES PERFORMED ON PREVIOUS CULTURE WITHIN THE LAST 5 DAYS. MODERATE CANDIDA ALBICANS Sent to [facility] for further susceptibility testing. NO ANAEROBES ISOLATED Performed at [facility], [address] [place], [place] [address]    Report Status PENDING  Incomplete   Organism ID, Bacteria PSEUDOMONAS AERUGINOSA  Final      Susceptibility   Pseudomonas aeruginosa - MIC*    CEFTAZIDIME 4 SENSITIVE Sensitive     CIPROFLOXACIN <=0.25  SENSITIVE Sensitive     GENTAMICIN 4  SENSITIVE Sensitive     IMIPENEM 2 SENSITIVE Sensitive     PIP/TAZO <=4 SENSITIVE Sensitive ug/mL    CEFEPIME 2 SENSITIVE Sensitive     * MODERATE PSEUDOMONAS AERUGINOSA  Aerobic/Anaerobic Culture w Gram Stain (surgical/deep wound)     Status: None   Collection Time: 05/22/24  3:59 PM   Specimen: Back  Result Value Ref Range Status   Specimen Description   Final    BACK Performed at [facility], [address], [place], [place] [address]    Special Requests   Final    TRANSGLUTEAL PERCUTANEOUS IR DRAIN Performed at [facility], [address] [place], [place] [address]    Gram Stain   Final    NO WBC SEEN ABUNDANT GRAM NEGATIVE RODS RARE GRAM POSITIVE COCCI IN PAIRS RARE BUDDING YEAST SEEN    Culture   Final    ABUNDANT PSEUDOMONAS AERUGINOSA ABUNDANT METHICILLIN RESISTANT STAPHYLOCOCCUS AUREUS ABUNDANT ENTEROCOCCUS FAECALIS ABUNDANT CANDIDA ALBICANS NO ANAEROBES ISOLATED Sent to [facility] for further susceptibility testing. Performed at [facility], [address], [place], [place] [address]    Report Status 05/27/2024 FINAL  Final   Organism ID, Bacteria PSEUDOMONAS AERUGINOSA  Final   Organism ID, Bacteria METHICILLIN RESISTANT STAPHYLOCOCCUS AUREUS  Final   Organism ID, Bacteria ENTEROCOCCUS FAECALIS  Final      Susceptibility   Enterococcus faecalis - MIC*    AMPICILLIN <=2 SENSITIVE Sensitive     VANCOMYCIN  1 SENSITIVE Sensitive     GENTAMICIN SYNERGY SENSITIVE Sensitive     * ABUNDANT ENTEROCOCCUS FAECALIS   Methicillin resistant staphylococcus aureus - MIC*    CIPROFLOXACIN >=8 RESISTANT Resistant     ERYTHROMYCIN  >=8 RESISTANT Resistant     GENTAMICIN <=0.5 SENSITIVE Sensitive     OXACILLIN >=4 RESISTANT Resistant     TETRACYCLINE <=1 SENSITIVE Sensitive     VANCOMYCIN  1 SENSITIVE Sensitive     TRIMETH/SULFA >=320 RESISTANT Resistant     CLINDAMYCIN <=0.25 SENSITIVE Sensitive      RIFAMPIN <=0.5 SENSITIVE Sensitive     Inducible Clindamycin NEGATIVE Sensitive     LINEZOLID 2 SENSITIVE Sensitive     * ABUNDANT METHICILLIN RESISTANT STAPHYLOCOCCUS AUREUS   Pseudomonas aeruginosa - MIC*    CEFTAZIDIME 4 SENSITIVE Sensitive     CIPROFLOXACIN <=0.25 SENSITIVE Sensitive     GENTAMICIN 4 SENSITIVE Sensitive     IMIPENEM 2 SENSITIVE Sensitive     PIP/TAZO <=4 SENSITIVE Sensitive ug/mL    CEFEPIME 2 SENSITIVE Sensitive     * ABUNDANT PSEUDOMONAS AERUGINOSA  Yeast Susceptibilities     Status: None   Collection Time: 05/22/24  3:59 PM  Result Value Ref Range Status   SOURCE CANDIDA ALBICAN ABD ABSC  Final    Comment: Performed at [facility], [address], [place], [place] [address]   Organism ID, Yeast Candida albicans  Final    Comment: (NOTE) Identification performed by account, not confirmed by this laboratory.    Amphotericin B MIC 0.25 ug/mL  Final    Comment: (NOTE) Breakpoints have been established for only some organism-drug combinations as indicated. This test was developed and its performance characteristics determined by [facility]. It has not been cleared or approved by the Food and Drug Administration.    Anidulafungin MIC Comment  Final    Comment: (NOTE) 0.03 ug/mL Susceptible Breakpoints have been established for only some organism-drug combinations as indicated. This test was developed and its performance characteristics determined by  [facility]. It has not been cleared or approved by the Food and Drug Administration.    Caspofungin MIC Comment  Final    Comment: (NOTE) 0.12 ug/mL Susceptible Breakpoints have been established for only some organism-drug combinations as indicated. This test was developed and its performance characteristics determined by [facility]. It has not been cleared or approved by the Food and Drug Administration.    Fluconazole  Islt MIC 1.0 ug/mL Susceptible  Final    Comment: (NOTE) Breakpoints have been  established for only some organism-drug combinations as indicated. This test was developed and its performance characteristics determined by [facility]. It has not been cleared or approved by the Food and Drug Administration.    ISAVUCONAZOLE MIC 0.008 ug/mL or less  Final    Comment: (NOTE) This test was developed and its performance characteristics determined by [facility]. It has not been cleared or approved by the Food and Drug Administration.    Itraconazole MIC 0.06 ug/mL  Final    Comment: (NOTE) Breakpoints have been established for only some organism-drug combinations as indicated. This test was developed and its performance characteristics determined by [facility]. It has not been cleared or approved by the Food and Drug Administration.    Micafungin  MIC Comment  Final    Comment: (NOTE) 0.008 ug/mL or less, Susceptible Breakpoints have been established for only some organism-drug combinations as indicated. This test was developed and its performance characteristics determined by [facility]. It has not been cleared or approved by the Food and Drug Administration.    Posaconazole MIC 0.06 ug/mL  Final    Comment: (NOTE) Breakpoints have been established for only some organism-drug combinations as indicated. This test was developed and its performance characteristics determined by [facility]. It has not been cleared or approved by the Food and Drug Administration.    REZAFUNGIN MIC Comment  Final    Comment: (NOTE) 0.15 ug/mL Susceptible This test was developed and its performance characteristics determined by [facility]. It has not been cleared or approved by the Food and Drug Administration.    Voriconazole MIC Comment  Final    Comment: (NOTE) 0.15 ug/mL Susceptible Breakpoints have been established for only some organism-drug combinations as indicated. This test was developed and its performance characteristics determined by [facility]. It has not been cleared or approved by  the Food and Drug Administration. Performed At: [facility] [address] [place], [place] [id number] Jennette Shorter MD Ey:[id number]   Aerobic/Anaerobic Culture w Gram Stain (surgical/deep wound)     Status: None   Collection Time: 05/27/24  5:19 PM   Specimen: Abscess  Result Value Ref Range Status   Specimen Description   Final    ABSCESS ABDOMEN Performed at [facility], [address], [place], [place] [address]    Special Requests   Final    NONE Performed at [facility], [address] [place], [place] [address]    Gram Stain   Final    ABUNDANT WBC PRESENT, PREDOMINANTLY PMN NO ORGANISMS SEEN    Culture   Final    RARE PSEUDOMONAS AERUGINOSA RARE CANDIDA ALBICANS NO ANAEROBES ISOLATED Performed at [facility], [address], [place], [place] [address]    Report Status 06/01/2024 FINAL  Final   Organism ID, Bacteria PSEUDOMONAS AERUGINOSA  Final      Susceptibility   Pseudomonas aeruginosa - MIC*    CEFTAZIDIME 4 SENSITIVE Sensitive     CIPROFLOXACIN <=0.25 SENSITIVE Sensitive     GENTAMICIN 4  SENSITIVE Sensitive     IMIPENEM 2 SENSITIVE Sensitive     PIP/TAZO <=4 SENSITIVE Sensitive ug/mL    CEFEPIME 2 SENSITIVE Sensitive     * RARE PSEUDOMONAS AERUGINOSA    Labs: Results for orders placed or performed during the hospital encounter of 05/08/24 (from the past 48 hours)  Comprehensive metabolic panel     Status: Abnormal   Collection Time: 06/02/24  2:55 AM  Result Value Ref Range   Sodium 132 (L) 135 - 145 mmol/L   Potassium 3.9 3.5 - 5.1 mmol/L   Chloride 101 98 - 111 mmol/L   CO2 23 22 - 32 mmol/L   Glucose, Bld 105 (H) 70 - 99 mg/dL    Comment: Glucose reference range applies only to samples taken after fasting for at least 8 hours.   BUN 18 8 - 23 mg/dL   Creatinine, Ser 9.28 0.44 - 1.00 mg/dL   Calcium  8.1 (L) 8.9 - 10.3 mg/dL   Total Protein 7.1 6.5 - 8.1 g/dL   Albumin  1.6 (L) 3.5 - 5.0  g/dL   AST 25 15 - 41 U/L   ALT 26 0 - 44 U/L   Alkaline Phosphatase 183 (H) 38 - 126 U/L   Total Bilirubin 0.9 0.0 - 1.2 mg/dL   GFR, Estimated >39 >39 mL/min    Comment: (NOTE) Calculated using the CKD-EPI Creatinine Equation (2021)    Anion gap 8 5 - 15    Comment: Performed at [facility], [address] [place], [place] [address]  Magnesium      Status: None   Collection Time: 06/02/24  2:55 AM  Result Value Ref Range   Magnesium  1.8 1.7 - 2.4 mg/dL    Comment: Performed at [facility], [address], [place], [place] [address]  Phosphorus     Status: None   Collection Time: 06/02/24  2:55 AM  Result Value Ref Range   Phosphorus 3.3 2.5 - 4.6 mg/dL    Comment: Performed at [facility], [address], [place], [place] [address]  Triglycerides     Status: None   Collection Time: 06/02/24  2:55 AM  Result Value Ref Range   Triglycerides 137 <150 mg/dL    Comment: Performed at [facility], [address], [place], [place] [address]  CBC     Status: Abnormal   Collection Time: 06/02/24  2:55 AM  Result Value Ref Range   WBC 9.0 4.0 - 10.5 K/uL   RBC 3.23 (L) 3.87 - 5.11 MIL/uL   Hemoglobin 9.0 (L) 12.0 - 15.0 g/dL   HCT 70.1 (L) 63.9 - 53.9 %   MCV 92.3 80.0 - 100.0 fL   MCH 27.9 26.0 - 34.0 pg   MCHC 30.2 30.0 - 36.0 g/dL   RDW 83.7 (H) 88.4 - 84.4 %   Platelets 424 (H) 150 - 400 K/uL   nRBC 0.0 0.0 - 0.2 %    Comment: Performed at [facility], [address], [place], [place] [address]    Imaging / Studies: No results found.     Medications / Allergies: per chart  Antibiotics: Anti-infectives (From admission, onward)    Start     Dose/Rate Route Frequency Ordered Stop   05/29/24 1500  DAPTOmycin  (CUBICIN ) IVPB 500 mg/79mL premix        8 mg/kg  60.9 kg 100 mL/hr over 30 Minutes Intravenous Daily 05/29/24 1359     05/28/24 2100  vancomycin  (VANCOREADY) IVPB  750 mg/150 mL  Status:  Discontinued        750 mg 150 mL/hr over 60 Minutes Intravenous Every 12 hours 05/28/24 1419 05/29/24 1359   05/27/24 0800  vancomycin  (VANCOCIN ) IVPB 1000 mg/200 mL premix  Status:  Discontinued        1,000 mg 200 mL/hr over 60 Minutes Intravenous Every 24 hours 05/26/24 1144 05/28/24 1419   05/23/24 1400  piperacillin -tazobactam (ZOSYN ) IVPB 3.375 g        3.375 g 12.5 mL/hr over 240 Minutes Intravenous Every 8 hours 05/23/24 0804     05/23/24 1200  micafungin  (MYCAMINE ) 100 mg in sodium chloride  0.9 % 100 mL IVPB        100 mg 105 mL/hr over 1 Hours Intravenous Every 24 hours 05/23/24 0755 06/06/24 1159   05/22/24 0800  vancomycin  (VANCOCIN ) IVPB 1000 mg/200 mL premix  Status:  Discontinued        1,000 mg 200 mL/hr over 60 Minutes Intravenous Every 24 hours 05/21/24 0634 05/26/24 0806   05/21/24 0730  vancomycin  (VANCOREADY) IVPB 1250 mg/250 mL        1,250 mg 166.7 mL/hr over 90 Minutes Intravenous  Once 05/21/24 0634 05/21/24 1043   05/19/24 1400  piperacillin -tazobactam (ZOSYN ) IVPB 3.375 g  Status:  Discontinued        3.375 g 12.5 mL/hr over 240 Minutes Intravenous Every 8 hours 05/19/24 1303 05/23/24 0804   05/09/24 0900  erythromycin  250 mg in sodium chloride  0.9 % 100 mL IVPB        250 mg 100 mL/hr over 60 Minutes Intravenous Every 8 hours 05/09/24 0732 05/11/24 0044   05/08/24 2200  cefoTEtan  (CEFOTAN ) 2 g in sodium chloride  0.9 % 100 mL IVPB        2 g 200 mL/hr over 30 Minutes Intravenous Every 12 hours 05/08/24 1759 05/09/24 0749   05/08/24 1400  neomycin  (MYCIFRADIN ) tablet 1,000 mg  Status:  Discontinued       Placed in And Linked Group   1,000 mg Oral 3 times per day 05/08/24 1119 05/08/24 1120   05/08/24 1400  metroNIDAZOLE  (FLAGYL ) tablet 1,000 mg  Status:  Discontinued       Placed in And Linked Group   1,000 mg Oral 3 times per day 05/08/24 1119 05/08/24 1120   05/08/24 1130  cefoTEtan  (CEFOTAN ) 2 g in sodium chloride  0.9 % 100 mL  IVPB        2 g 200 mL/hr over 30 Minutes Intravenous On call to O.R. 05/08/24 1119 05/09/24 0749         Note: Portions of this report may have been transcribed using voice recognition software. Every effort was made to ensure accuracy; however, inadvertent computerized transcription errors may be present.   Any transcriptional errors that result from this process are unintentional.    Elspeth KYM Schultze, MD, FACS, MASCRS Esophageal, Gastrointestinal & Colorectal Surgery Robotic and Minimally Invasive Surgery  [facility] [address] [place], [place] [address] [phone] Fax [phone] Main  CONTACT INFORMATION: Weekday (9AM-5PM): Call [facility] main office at [phone] Weeknight (5PM-9AM) or Weekend/Holiday: Check EPIC Web Links tab & use AMION (password  [email or web address]) for General Surgery [facility] coverage  Please, DO NOT use SecureChat  (it is not reliable communication to reach operating surgeons & will lead to a delay in care).   Epic staff messaging available for outptient concerns needing 1-2 business  day response.      06/02/2024  7:05 AM

## 2024-06-02 NOTE — Progress Notes (Addendum)
 Nutrition Follow-up  DOCUMENTATION CODES:   Severe malnutrition in context of chronic illness  INTERVENTION:   -TPN per Pharmacy -recommend going back to 100% of nutrition initially following surgery 6/24, to support post-op healing unless tube feeds can be started soon after -Daily weights while on TPN  Tube feeding recommendations: -Initiate Osmolite 1.5 @ 20 ml/hr, advance by 10 ml every 24 hours to goal rate of 50 ml/hr. -Provides 1800 kcals, 75g protein and 914 ml H2O  -D/c Calorie count  -D/c Mallie Pinion  NUTRITION DIAGNOSIS:   Severe Malnutrition related to chronic illness as evidenced by severe fat depletion, severe muscle depletion.  Ongoing  GOAL:   Patient will meet greater than or equal to 90% of their needs  Progressing.  MONITOR:   Diet advancement, Labs, Weight trends  REASON FOR ASSESSMENT:   Consult Calorie Count, Poor PO, Wound healing  ASSESSMENT:   83 year old female with PMH HTN, mild cognitive impairment, asd large bowel obstruction (October 2024) s/p Hartman's resection and colostomy creation who was admitted 5/29 for colostomy reversal.  5/29 Admit; s/p colostomy reversal, small bowel resection, and extensive lysis of adhesions; CLD 5/30 Soft diet -> FLD 6/2 NPO; NGT placed; TPN to be initiated 6/3 CLD 6/4-6/5 Dysphagia 1 -> Soft-> Reg diet, Calorie Count #1 completed, consumed 11-14% of needs 6/6 Soft diet 6/7-6/8 Calorie Count #2 completed, consumed 10-22% of needs 6/9 Heart Healthy -> NPO 6/10 s/p drain placement 6/11 CLD 6/12 FLD -> Regular diet -> soft diet 6/13-6/15 Regular diet, Calorie Count #3 -consumed 1-4% of needs 6/16 Heart Healthy diet 6/17 NPO ->Regular diet 6/19-6/20 Calorie Count #4 -pt consumed 0-10% of needs  Calorie count results 6/20: B: refused 0% L: refused 0% D: 25% =175 kcals, 7g protein Supplements: refused Total: 175 kcals (10% of needs), 7g protein (8% of needs)  Per surgery note, plan is for  patient to have open abdominal exploration 6/24, possible G-tube placement if able. Will leave tube feeds recommendations above.    TPN to continue at half rate, 40 ml/hr, providing 960 kcals and 53g protein.  Admission weight: 127 lbs Current weight: 129 lbs   Medications: Vitamin B-12, Imodium , Megace , Multivitamin with minerals daily, Fibercon,   Labs reviewed: Low Na   Diet Order:   Diet Order             Diet NPO time specified Except for: Sips with Meds, Ice Chips  Diet effective 0500 tomorrow           Diet clear liquid Room service appropriate? Yes; Fluid consistency: Thin  Diet effective midnight           Diet regular Room service appropriate? Yes; Fluid consistency: Thin  Diet effective now           Diet - low sodium heart healthy                   EDUCATION NEEDS:   Education needs have been addressed  Skin:  Skin Assessment: Skin Integrity Issues: Skin Integrity Issues:: Stage II, Incisions Stage II: Right Buttocks Incisions: Abdomen  Last BM:  6/23 - type 7  Height:   Ht Readings from Last 1 Encounters:  05/09/24 5' 5 (1.651 m)    Weight:   Wt Readings from Last 1 Encounters:  05/31/24 58.8 kg    BMI:  Body mass index is 21.57 kg/m.  Estimated Nutritional Needs:   Kcal:  1600-1750 kcals  Protein:  80-95 grams  Fluid:  >/=  1.6L  Morna Lee, MS, RD, LDN Inpatient Clinical Dietitian Contact via Secure chat

## 2024-06-02 NOTE — Anesthesia Preprocedure Evaluation (Addendum)
 Anesthesia Evaluation  Patient identified by MRN, date of birth, ID band Patient awake    Reviewed: Allergy & Precautions, NPO status , Patient's Chart, lab work & pertinent test results  Airway Mallampati: II  TM Distance: >3 FB Neck ROM: Full    Dental no notable dental hx. (+) Teeth Intact, Dental Advisory Given   Pulmonary PE (oneliquis IVC filter)   Pulmonary exam normal breath sounds clear to auscultation       Cardiovascular hypertension, Pt. on medications and Pt. on home beta blockers Normal cardiovascular exam+ dysrhythmias Atrial Fibrillation  Rhythm:Regular Rate:Normal  05/10/2024 Echo  1. Left ventricular ejection fraction, by estimation, is 60 to 65%. The  left ventricle has normal function. The left ventricle has no regional  wall motion abnormalities. Left ventricular diastolic parameters are  indeterminate.   2. Right ventricular systolic function is normal. The right ventricular  size is normal. There is normal pulmonary artery systolic pressure.   3. The mitral valve was not well visualized. Mild mitral valve  regurgitation. No evidence of mitral stenosis.   4. The tricuspid valve is abnormal. Tricuspid valve regurgitation is mild  to moderate.   5. The aortic valve is tricuspid. Aortic valve regurgitation is not  visualized. No aortic stenosis is present.   6. The inferior vena cava is normal in size with greater than 50%  respiratory variability, suggesting right atrial pressure of 3 mmHg.      Neuro/Psych  PSYCHIATRIC DISORDERS Anxiety        GI/Hepatic   Endo/Other    Renal/GU Renal diseaseLab Results      Component                Value               Date                                   K                        3.9                 06/02/2024                    CREATININE               0.71                06/02/2024                GFRNONAA                 >60                 06/02/2024                 CALCIUM                   8.1 (L)             06/02/2024               ALBUMIN                   1.6 (L)             06/02/2024                 hyponatremia  Musculoskeletal   Abdominal   Peds  Hematology  (+) Blood dyscrasia, anemia On Eliquis   Lab Results      Component                Value               Date                      WBC                      9.0                 06/02/2024                HGB                      9.0 (L)             06/02/2024                HCT                      29.8 (L)            06/02/2024                MCV                      92.3                06/02/2024                PLT                      424 (H)             06/02/2024              Anesthesia Other Findings All: Sulfa  Reproductive/Obstetrics                             Anesthesia Physical Anesthesia Plan  ASA: 3  Anesthesia Plan: General   Post-op Pain Management: Precedex  and Ofirmev  IV (intra-op)*   Induction: Intravenous  PONV Risk Score and Plan: 2 and Treatment may vary due to age or medical condition, Ondansetron  and Dexamethasone   Airway Management Planned: Oral ETT  Additional Equipment: Arterial line  Intra-op Plan:   Post-operative Plan: Extubation in OR and Possible Post-op intubation/ventilation  Informed Consent: I have reviewed the patients History and Physical, chart, labs and discussed the procedure including the risks, benefits and alternatives for the proposed anesthesia with the patient or authorized representative who has indicated his/her understanding and acceptance.     Dental advisory given  Plan Discussed with: CRNA and Surgeon  Anesthesia Plan Comments:        Anesthesia Quick Evaluation

## 2024-06-02 NOTE — Progress Notes (Signed)
 PROGRESS NOTE    Elizabeth Odom  FMW:[id number] DOB: Aug 27, 1941 DOA: 05/08/2024 PCP: Jesus Bernardino MATSU, MD   Brief Narrative: Elizabeth Odom is a 83 y.o. female with a history of atrial fibrillation, DVT, PE, anemia, PACs, hypertension, mild cognitive impairment, large bowel obstruction.  Patient initially presented for surgical management involving history of diverticulitis with planned ostomy takedown with anastomosis. During hospitalization, patient developed altered mental status secondary to polypharmacy in addition to atrial fibrillation with RVR. Cardiology consulted for recommendation and management with subsequent improvement in RVR. Ongoing care by general surgery.   Assessment and Plan:  Persistent atrial fibrillation/flutter Cardiology consulted for management.  Medication adjusted to metoprolol  XL 25 mg daily with recommendation for Eliquis  when okay per surgery. Toprol -XL decreased to 12.5 mg daily -Continue Toprol -XL 12.5 mg daily -Continue Eliquis   History of diverticulitis with perforation and abscess with colostomy status post ostomy takedown Ileus Per primary.  Infectious diseases consulted and are planning for management with multi week course of antibiotics.  Urine culture (6/12) significant for Pseudomonas, MRSA, Enterococcus faecalis, Candida albicans.  Drain culture (6/17) significant for rare Pseudomonas aeruginosa.  Ongoing antibiotics per infectious disease.  Patient now on daptomycin , micafungin , Zosyn   Acute blood loss anemia Secondary to surgery.  Hemoglobin down to a low of 6.1 this admission.  Patient received a total of 4 units of PRBC via transfusion this admission.  Hemoglobin currently stable.  AKI Mild. Resolved.  Hypoalbuminemia Noted. Associated poor nutrition.  Leukocytosis In setting of surgery. Resolved.  Thrombocytosis Noted. Likely reactive. Mildly elevated and stable.  Hyponatremia Mild.  Syncope Syncopal episode occurred during  hospitalization while patient was on commode.  Concern for possible vasovagal versus hypotension etiology.  Transthoracic echo obtained with evidence of preserved LVEF of 60 to 65% in addition to no aortic stenosis.  Acute urinary retention Foley inserted on 6/4. Flomax  started. Foley removed on 6/13. -Continue Flomax   Acute metabolic encephalopathy Delirium Resolved.  Generalized weakness Physical therapy is ordered already.  Discussed with patient that is important for her to participate as much as possible with therapy to improve her strength.   DVT prophylaxis: Per primary Code Status:   Code Status: Full Code Family Communication: None at bedside Disposition Plan: Per primary   Consultants:    Procedures:    Antimicrobials:     Subjective: Patient reports agreeing to consideration for diverting colostomy. She states that she has no choice.  Objective: BP (!) 126/56   Pulse 93   Temp 99.1 F (37.3 C) (Oral)   Resp 17   Ht 5' 5 (1.651 m)   Wt 58.8 kg   SpO2 96%   BMI 21.57 kg/m   Examination:  General exam: Appears calm and comfortable Respiratory system: Respiratory effort normal. Cardiovascular system: S1 & S2 heard, RRR. Gastrointestinal system: Abdomen is distended Central nervous system: Alert and oriented. Psychiatry: Judgement and insight appear normal.   Data Reviewed: I have personally reviewed following labs and imaging studies  CBC Lab Results  Component Value Date   WBC 9.0 06/02/2024   RBC 3.23 (L) 06/02/2024   HGB 9.0 (L) 06/02/2024   HCT 29.8 (L) 06/02/2024   MCV 92.3 06/02/2024   MCH 27.9 06/02/2024   PLT 424 (H) 06/02/2024   MCHC 30.2 06/02/2024   RDW 16.2 (H) 06/02/2024   LYMPHSABS 0.7 05/09/2024   MONOABS 1.8 (H) 05/09/2024   EOSABS 0.0 05/09/2024   BASOSABS 0.0 05/09/2024     Last metabolic panel Lab Results  Component Value Date   NA 132 (L) 06/02/2024   K 3.9 06/02/2024   CL 101 06/02/2024   CO2 23 06/02/2024    BUN 18 06/02/2024   CREATININE 0.71 06/02/2024   GLUCOSE 105 (H) 06/02/2024   GFRNONAA >60 06/02/2024   GFRAA >60 04/10/2016   CALCIUM  8.1 (L) 06/02/2024   PHOS 3.3 06/02/2024   PROT 7.1 06/02/2024   ALBUMIN  1.6 (L) 06/02/2024   LABGLOB 2.9 04/08/2024   BILITOT 0.9 06/02/2024   ALKPHOS 183 (H) 06/02/2024   AST 25 06/02/2024   ALT 26 06/02/2024   ANIONGAP 8 06/02/2024    GFR: Estimated Creatinine Clearance: 47.9 mL/min (by C-G formula based on SCr of 0.71 mg/dL).  Recent Results (from the past 240 hours)  Aerobic/Anaerobic Culture w Gram Stain (surgical/deep wound)     Status: None   Collection Time: 05/27/24  5:19 PM   Specimen: Abscess  Result Value Ref Range Status   Specimen Description   Final    ABSCESS ABDOMEN Performed at [facility], [address], [place], [place] [address]    Special Requests   Final    NONE Performed at [facility], [address], [place], [place] [address]    Gram Stain   Final    ABUNDANT WBC PRESENT, PREDOMINANTLY PMN NO ORGANISMS SEEN    Culture   Final    RARE PSEUDOMONAS AERUGINOSA RARE CANDIDA ALBICANS NO ANAEROBES ISOLATED Performed at [facility], [address] [place], [place] [address]    Report Status 06/01/2024 FINAL  Final   Organism ID, Bacteria PSEUDOMONAS AERUGINOSA  Final      Susceptibility   Pseudomonas aeruginosa - MIC*    CEFTAZIDIME 4 SENSITIVE Sensitive     CIPROFLOXACIN <=0.25 SENSITIVE Sensitive     GENTAMICIN 4 SENSITIVE Sensitive     IMIPENEM 2 SENSITIVE Sensitive     PIP/TAZO <=4 SENSITIVE Sensitive ug/mL    CEFEPIME 2 SENSITIVE Sensitive     * RARE PSEUDOMONAS AERUGINOSA      Radiology Studies: No results found.     LOS: 25 days    Elgin Lam, MD [facility] 06/02/2024, 11:10 AM   If 7PM-7AM, please contact night-coverage [email or web address]

## 2024-06-02 NOTE — Progress Notes (Signed)
 Tried to reach patient's granddaughter, [name], without luck so far.  Will try again later today versus tomorrow

## 2024-06-02 NOTE — H&P (View-Only) (Signed)
 06/02/2024  Elizabeth Odom [id number] 08/01/1941  CARE TEAM: PCP: Jesus Bernardino MATSU, MD  Outpatient Care Team: Patient Care Team: Jesus Bernardino MATSU, MD as PCP - General (Internal Medicine) Elmira Newman PARAS, MD as PCP - Cardiology (Cardiology) [name], MD as Consulting Physician (Gastroenterology) Rubin Calamity, MD as Consulting Physician (General Surgery) [name], MD (Neurology) [name], MD as Consulting Physician (Urology) [name], MD as Consulting Physician (Cardiology) [name], MD as Consulting Physician (Colon and Rectal Surgery)  Inpatient Treatment Team: Treatment Team:  [name], MD Nikki Rams, Aliene, MD Drusilla Sabas RAMAN, MD [name], MD Bobbette Cartwright, MD Cheryle Page, MD Allred, Harman POUR, PA-C Mugweru, Jon, MD [name] LABOR, MD [name], MD Dennise Kingsley, MD Cesario Mylinda KIDD, RN Margery Katheryn LABOR, PTA Dorian Chiquita CROME, RN   Problem List:   Principal Problem:   Diverticular disease Active Problems:   A-fib [facility])   Restless leg   ABLA (acute blood loss anemia)   Need for emotional support   Acute urinary retention   Mixed hyperlipidemia   Essential hypertension   Diverticular disease of left colon   History of DVT (deep vein thrombosis)   History of pulmonary embolus (PE)   Pre-diabetes   Presence of IVC filter   Stricture of sigmoid colon (HCC)   Protein-calorie malnutrition, severe   Hypokalemia   05/08/2024  POST-OPERATIVE DIAGNOSIS:   COLOSTOMY FOR RESECTION, DESIRE FOR OSTOMY TAKEDOWN RECTAL STRICTURE HISTORY OF DIVERTICULITIS WITH PERFORATION & ABSCESS   PROCEDURE:   -ROBOTIC RECTOSIGMOID RESECTION (LAR) -TAKEDOWN OF END COLOSTOMY WITH ANASTOMOSIS -RESECTION OF SMALL INTESTINE WITH ANASTOMOSIS -SMALL BOWEL REPAIR -LYSIS OF ADHESIONS x 115 MINUTES (66% OF CASE),  -INTRAOPERATIVE ASSESSMENT OF TISSUE VASCULAR PERFUSION USING ICG (indocyanine green )  IMMUNOFLUORESCENCE,  -TRANSVERSUS ABDOMINIS PLANE (TAP) BLOCK - BILATERAL -FLEXIBLE SIGMOIDOSCOPY   SURGEON:  [name], MD  OR FINDINGS:  Very dense adhesions of small bowel especially to pelvis and retroperitoneum.  Extremely concrete dense adhesions with friable tissue near her old prior small bowel anastomosis and distal jejunum.  Required small bowel repair and required small bowel resection including old anastomosis given severe tissues.  Patient had significant fibrotic stricturing of rectal stump at the mid/distal rectal junction.  Could not be released up.  Ended up doing resection of rectal stump to mid/distal rectal junction.  No obvious metastatic disease on visceral parietal peritoneum or liver.   It is a 29mm EEA anastomosis ( distal descending colon  connected to mid/distal rectal junction.)  It rests 7 cm from the anal verge by rigid proctoscopy.  05/20/2024  Post procedural Dx: Post op abscess, multiple   Technically successful CT guided placed of a  10 Fr drainage catheter placement x 2  into the midline ventral and dorsal perirectal abscesses.     [name] LABOR, MD    Assessment [facility] Stay = 25 days) 25 Days Post-Op    Failure to thrive with delayed anastomotic leak despite TPN, antibiotics, drainage.   Plan:  I had a long discussion with the patient again.  She is not rallying with the drain replacement near the anastomosis like she did this 1st time.  Still having intermittent diarrhea.  She is not eating.  Her nutrition remains fair although prealbumin is better with the help of the TPN.    I think we have failed nonoperative management.  I think she requires fecal diversion.  Probably permanent.    I recommended  laparoscopic probable open abdominal exploration.  [place].  Evaluate the anastomosis.  If very small contained and I can patch it, can try to do diverting loop ileostomy in the hopes of turning around and taking the ostomy down in 6  months.  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  If there is evidence of ischemia or significant leak then convert to a permanent end colostomy.  She has been against surgery but agrees that she is not getting better and is considering it  She refuses to eat much.  I am leaning towards placing a feeding gastrostomy tube as well, so that I know that she can get full enteral nutrition and get off the TPN.  Hopefully could be temporary if she eats better and can get off it in a couple of months.  We will see.   I am going to call & discuss with her granddaughter as well who seems to understand this unfortunate/challenging situation even better.  The anatomy & physiology of the digestive tract was discussed.  The pathophysiology of perforation was discussed.  Differential diagnosis such as perforated ulcer or colon, etc was discussed.   Natural history risks without surgery such as death was discussed.  I recommended abdominal exploration to diagnose & treat the source of the problem.  Laparoscopic & open techniques were discussed.   Risks such as bleeding, infection, abscess, leak, reoperation, bowel resection, possible ostomy, injury to other organs, need for repair of tissues / organs, hernia, heart attack, death, and other risks were discussed.   The risks of no intervention will lead to serious problems including death.   I expressed a good likelihood that surgery will address the problem.    Goals of post-operative recovery were discussed as well.  We will work to minimize complications although risks in an emergent setting are high.   Questions were answered.  The patient expressed understanding & wishes to proceed with surgery.        CT scan abdomen pelvis to make sure there are no undrained abscesses and get a lay of the land.  Make sure were not missing anything.  Preoperative marking  for probable diverting loop ileostomy/diverting colostomy.  Help my plan if I can get a gastrostomy tube in there as well.    Growing numerous organisms including Pseudomonas, MRSA and Candida albicans.    Discussed with Dr. Dennise with infectious disease.  She suspects many of these organs are just colonization/contamination of the drain I do not represent necessarily any abscesses.  She is adjusting antibiotics.  Will see what the new drain shows.  She is leaning towards 4 weeks of antibiotics postop just in case.  Pain control - opioids make her rather sedated/groggy.  Continue gabapentin /acetaminophen /cyclobenzaprine   Severe malnutrition   TPN for severe protein malnutrition.    Retry calorie counts now that all the drains are done.  Will do half TPN for now.  Add Megace  -no improvement in appetite yet.  Prealbumin starting to climb in more normal range which is a guardedly hopeful sign.    History of urinary retention  Foley catheter out with no problems with urination now that pelvic collection under better control.    Continue tamsulosin    if fails then replace and leave for several weeks until can follow-up with urology as an outpatient.  Less likely now  No  rectal tubes.  Atrial fibrillation postop.  History of a prior event.    Cardiology signed off for now.  Reconsult as needed.  Stop telemetry for now  Restarted DOAC Eliquis  6/12 after prior hematochezia.  No strong evidence of that.  Follow hemoglobin.  Holding Eliquis  6/15 in anticipation of drain replacement 6/17, and restart 6/18  Rate controlled switch from amlodipine  to metoprolol .  Less tachycardic.  Blood pressure soft.  Back down 12.5 mg of metoprolol  and follow with PRN (as needed) breakthrough  -monitor electrolytes & replace as needed.  Keep K>4, Mg>2, Phos>3  -VTE prophylaxis- SCDs.  Anticoagulation as appropriate  -mobilize as tolerated to help recovery.  Enlist therapies in moderate/high risk  patients as appropriate.  Patient is refusing skilled facility or any rehab but is not getting up much.  Try to have strategies to help her get up and mobilize and actually work with physical therapy, or she is going to lose her argument.    I updated the patient's status to the patient and nurse.  Recommendations were made.  Questions were answered.  They expressed understanding & appreciation.  -Disposition: TBD - I think she needs SNF.  She has been very resistant, but she may reconsider      I reviewed last 24 h vitals and pain scores, last 48 h intake and output, last 24 h labs and trends, and last 24 h imaging results.  I have reviewed this patient's available data, including medical history, events of note, test results, etc as part of my evaluation.   A significant portion of that time was spent in counseling. Care during the described time interval was provided by me.  This care required moderate level of medical decision making.  06/02/2024    Subjective: (Chief complaint)  Patient staying in bed.  Does not feel like eating or drinking much.  Says she has no appetite.  No nausea or vomiting.  Soreness in lower abdomen unchanged.  Mostly irritated around drain.  No diarrhea last night.    Objective:  Vital signs:  Vitals:   06/01/24 0524 06/01/24 1332 06/01/24 2127 06/02/24 0556  BP: 124/67 113/62 (!) 133/57 (!) 126/56  Pulse: (!) 102 92 91 93  Resp: 17 18 17 17   Temp: 99.8 F (37.7 C) 98.4 F (36.9 C) 100.1 F (37.8 C) 99.1 F (37.3 C)  TempSrc: Oral Oral Oral Oral  SpO2: 95% 97% 96% 96%  Weight:      Height:        Last BM Date : 06/01/24  Intake/Output   Yesterday:  06/22 0701 - 06/23 0700 In: 1383.5 [P.O.:600; I.V.:502.5; IV Piggyback:261] Out: 100 [Drains:100] This shift:  No intake/output data recorded.  Bowel function:  Flatus: YES  BM:  YES -Play-Doh soft.  Drain:  Right lower quadrant drain: Scant seropurulent   Transgluteal: light  brown/thinly feculent  Physical Exam:  General: Alert.  More talkative.       Eyes: PERRL, normal EOM.  Sclera clear.  No icterus Neuro: CN II-XII intact w/o focal sensory/motor deficits.  All extremities.  No facial droop. Lymph: No head/neck/groin lymphadenopathy Psych:  No delerium/psychosis/paranoia.  Oriented x 4 HENT: Normocephalic, Mucus membranes moist.  No thrush.  Mild HOH Neck: Supple, No tracheal deviation.  No obvious thyromegaly.  Prefers to sit with head flexed and chin down.  Does not want to have her neck back.  Pillow adjusted. Chest: No pain to chest wall compression.  Good respiratory excursion.  No audible  wheezing CV:  Pulses intact.  Regular rhythm.  No major extremity edema MS: Normal AROM mjr joints.  No obvious deformity  Abdomen:  Soft.  Moderately distended.  Nontender.  No guarding.   Discomfort around drain.  Lower abdomen with some soreness  No evidence of peritonitis. Old ostomy incision with normal healing ridge.    GU: No Foley Rectal: Some erythema but improved overall.  No diarrhea in bed.   Ext:   No deformity.  No mjr edema.  No cyanosis Skin: No petechiae / purpurea.  No major sores.  Warm and dry    Results:   Cultures: Recent Results (from the past 720 hours)  Urine Culture     Status: Abnormal   Collection Time: 05/09/24  8:30 PM   Specimen: Urine, Clean Catch  Result Value Ref Range Status   Specimen Description   Final    URINE, CLEAN CATCH Performed at [facility], [address] [place], [place] [address]    Special Requests   Final    NONE Performed at [facility], [address], [place], [place] [address]    Culture (A)  Final    <10,000 COLONIES/mL INSIGNIFICANT GROWTH Performed at [facility], [address], [place], [place] [address]    Report Status 05/11/2024 FINAL  Final  MIC (1 Drug)-Abdominal drain abscess; 05/22/2024; Abdomen; MRSA; Daptomycin      Status: Abnormal    Collection Time: 05/22/24  1:54 PM   Specimen: Abdomen  Result Value Ref Range Status   Min Inhibitory Conc (1 Drug) Preliminary report (A)  Final    Comment: (NOTE) Performed At: [facility] [address] [place], [place] [id number] Jennette Shorter MD Ey:[id number]    Source ABSCESS  Final    Comment: Performed at [facility], [address], [place], [place] [address]  MIC Result     Status: Abnormal   Collection Time: 05/22/24  1:54 PM  Result Value Ref Range Status   Result 1 (MIC) Comment (A)  Final    Comment: (NOTE) Methicillin - resistant Staphylococcus aureus Identification performed by account, not confirmed by this laboratory. Performed At: [facility] [address] [place], [place] [id number] Jennette Shorter MD Ey:[id number]   Aerobic/Anaerobic Culture w Gram Stain (surgical/deep wound)     Status: None (Preliminary result)   Collection Time: 05/22/24  3:59 PM   Specimen: Abdomen  Result Value Ref Range Status   Specimen Description   Final    ABDOMEN Performed at [facility], [address] [place], [place] [address]    Special Requests   Final    ABDOMINAL DRAIN Performed at [facility], [address] [place], [place] [address]    Gram Stain   Final    ABUNDANT WBC PRESENT, PREDOMINANTLY PMN RARE GRAM POSITIVE COCCI RARE YEAST WITH PSEUDOHYPHAE    Culture   Final    MODERATE PSEUDOMONAS AERUGINOSA MODERATE STAPHYLOCOCCUS AUREUS SUSCEPTIBILITIES PERFORMED ON PREVIOUS CULTURE WITHIN THE LAST 5 DAYS. MODERATE CANDIDA ALBICANS Sent to [facility] for further susceptibility testing. NO ANAEROBES ISOLATED Performed at [facility], [address] [place], [place] [address]    Report Status PENDING  Incomplete   Organism ID, Bacteria PSEUDOMONAS AERUGINOSA  Final      Susceptibility   Pseudomonas aeruginosa - MIC*    CEFTAZIDIME 4 SENSITIVE Sensitive     CIPROFLOXACIN <=0.25  SENSITIVE Sensitive     GENTAMICIN 4  SENSITIVE Sensitive     IMIPENEM 2 SENSITIVE Sensitive     PIP/TAZO <=4 SENSITIVE Sensitive ug/mL    CEFEPIME 2 SENSITIVE Sensitive     * MODERATE PSEUDOMONAS AERUGINOSA  Aerobic/Anaerobic Culture w Gram Stain (surgical/deep wound)     Status: None   Collection Time: 05/22/24  3:59 PM   Specimen: Back  Result Value Ref Range Status   Specimen Description   Final    BACK Performed at [facility], [address], [place], [place] [address]    Special Requests   Final    TRANSGLUTEAL PERCUTANEOUS IR DRAIN Performed at [facility], [address] [place], [place] [address]    Gram Stain   Final    NO WBC SEEN ABUNDANT GRAM NEGATIVE RODS RARE GRAM POSITIVE COCCI IN PAIRS RARE BUDDING YEAST SEEN    Culture   Final    ABUNDANT PSEUDOMONAS AERUGINOSA ABUNDANT METHICILLIN RESISTANT STAPHYLOCOCCUS AUREUS ABUNDANT ENTEROCOCCUS FAECALIS ABUNDANT CANDIDA ALBICANS NO ANAEROBES ISOLATED Sent to [facility] for further susceptibility testing. Performed at [facility], [address], [place], [place] [address]    Report Status 05/27/2024 FINAL  Final   Organism ID, Bacteria PSEUDOMONAS AERUGINOSA  Final   Organism ID, Bacteria METHICILLIN RESISTANT STAPHYLOCOCCUS AUREUS  Final   Organism ID, Bacteria ENTEROCOCCUS FAECALIS  Final      Susceptibility   Enterococcus faecalis - MIC*    AMPICILLIN <=2 SENSITIVE Sensitive     VANCOMYCIN  1 SENSITIVE Sensitive     GENTAMICIN SYNERGY SENSITIVE Sensitive     * ABUNDANT ENTEROCOCCUS FAECALIS   Methicillin resistant staphylococcus aureus - MIC*    CIPROFLOXACIN >=8 RESISTANT Resistant     ERYTHROMYCIN  >=8 RESISTANT Resistant     GENTAMICIN <=0.5 SENSITIVE Sensitive     OXACILLIN >=4 RESISTANT Resistant     TETRACYCLINE <=1 SENSITIVE Sensitive     VANCOMYCIN  1 SENSITIVE Sensitive     TRIMETH/SULFA >=320 RESISTANT Resistant     CLINDAMYCIN <=0.25 SENSITIVE Sensitive      RIFAMPIN <=0.5 SENSITIVE Sensitive     Inducible Clindamycin NEGATIVE Sensitive     LINEZOLID 2 SENSITIVE Sensitive     * ABUNDANT METHICILLIN RESISTANT STAPHYLOCOCCUS AUREUS   Pseudomonas aeruginosa - MIC*    CEFTAZIDIME 4 SENSITIVE Sensitive     CIPROFLOXACIN <=0.25 SENSITIVE Sensitive     GENTAMICIN 4 SENSITIVE Sensitive     IMIPENEM 2 SENSITIVE Sensitive     PIP/TAZO <=4 SENSITIVE Sensitive ug/mL    CEFEPIME 2 SENSITIVE Sensitive     * ABUNDANT PSEUDOMONAS AERUGINOSA  Yeast Susceptibilities     Status: None   Collection Time: 05/22/24  3:59 PM  Result Value Ref Range Status   SOURCE CANDIDA ALBICAN ABD ABSC  Final    Comment: Performed at [facility], [address], [place], [place] [address]   Organism ID, Yeast Candida albicans  Final    Comment: (NOTE) Identification performed by account, not confirmed by this laboratory.    Amphotericin B MIC 0.25 ug/mL  Final    Comment: (NOTE) Breakpoints have been established for only some organism-drug combinations as indicated. This test was developed and its performance characteristics determined by [facility]. It has not been cleared or approved by the Food and Drug Administration.    Anidulafungin MIC Comment  Final    Comment: (NOTE) 0.03 ug/mL Susceptible Breakpoints have been established for only some organism-drug combinations as indicated. This test was developed and its performance characteristics determined by  [facility]. It has not been cleared or approved by the Food and Drug Administration.    Caspofungin MIC Comment  Final    Comment: (NOTE) 0.12 ug/mL Susceptible Breakpoints have been established for only some organism-drug combinations as indicated. This test was developed and its performance characteristics determined by [facility]. It has not been cleared or approved by the Food and Drug Administration.    Fluconazole  Islt MIC 1.0 ug/mL Susceptible  Final    Comment: (NOTE) Breakpoints have been  established for only some organism-drug combinations as indicated. This test was developed and its performance characteristics determined by [facility]. It has not been cleared or approved by the Food and Drug Administration.    ISAVUCONAZOLE MIC 0.008 ug/mL or less  Final    Comment: (NOTE) This test was developed and its performance characteristics determined by [facility]. It has not been cleared or approved by the Food and Drug Administration.    Itraconazole MIC 0.06 ug/mL  Final    Comment: (NOTE) Breakpoints have been established for only some organism-drug combinations as indicated. This test was developed and its performance characteristics determined by [facility]. It has not been cleared or approved by the Food and Drug Administration.    Micafungin  MIC Comment  Final    Comment: (NOTE) 0.008 ug/mL or less, Susceptible Breakpoints have been established for only some organism-drug combinations as indicated. This test was developed and its performance characteristics determined by [facility]. It has not been cleared or approved by the Food and Drug Administration.    Posaconazole MIC 0.06 ug/mL  Final    Comment: (NOTE) Breakpoints have been established for only some organism-drug combinations as indicated. This test was developed and its performance characteristics determined by [facility]. It has not been cleared or approved by the Food and Drug Administration.    REZAFUNGIN MIC Comment  Final    Comment: (NOTE) 0.15 ug/mL Susceptible This test was developed and its performance characteristics determined by [facility]. It has not been cleared or approved by the Food and Drug Administration.    Voriconazole MIC Comment  Final    Comment: (NOTE) 0.15 ug/mL Susceptible Breakpoints have been established for only some organism-drug combinations as indicated. This test was developed and its performance characteristics determined by [facility]. It has not been cleared or approved by  the Food and Drug Administration. Performed At: [facility] [address] [place], [place] [id number] Jennette Shorter MD Ey:[id number]   Aerobic/Anaerobic Culture w Gram Stain (surgical/deep wound)     Status: None   Collection Time: 05/27/24  5:19 PM   Specimen: Abscess  Result Value Ref Range Status   Specimen Description   Final    ABSCESS ABDOMEN Performed at [facility], [address], [place], [place] [address]    Special Requests   Final    NONE Performed at [facility], [address] [place], [place] [address]    Gram Stain   Final    ABUNDANT WBC PRESENT, PREDOMINANTLY PMN NO ORGANISMS SEEN    Culture   Final    RARE PSEUDOMONAS AERUGINOSA RARE CANDIDA ALBICANS NO ANAEROBES ISOLATED Performed at [facility], [address], [place], [place] [address]    Report Status 06/01/2024 FINAL  Final   Organism ID, Bacteria PSEUDOMONAS AERUGINOSA  Final      Susceptibility   Pseudomonas aeruginosa - MIC*    CEFTAZIDIME 4 SENSITIVE Sensitive     CIPROFLOXACIN <=0.25 SENSITIVE Sensitive     GENTAMICIN 4  SENSITIVE Sensitive     IMIPENEM 2 SENSITIVE Sensitive     PIP/TAZO <=4 SENSITIVE Sensitive ug/mL    CEFEPIME 2 SENSITIVE Sensitive     * RARE PSEUDOMONAS AERUGINOSA    Labs: Results for orders placed or performed during the hospital encounter of 05/08/24 (from the past 48 hours)  Comprehensive metabolic panel     Status: Abnormal   Collection Time: 06/02/24  2:55 AM  Result Value Ref Range   Sodium 132 (L) 135 - 145 mmol/L   Potassium 3.9 3.5 - 5.1 mmol/L   Chloride 101 98 - 111 mmol/L   CO2 23 22 - 32 mmol/L   Glucose, Bld 105 (H) 70 - 99 mg/dL    Comment: Glucose reference range applies only to samples taken after fasting for at least 8 hours.   BUN 18 8 - 23 mg/dL   Creatinine, Ser 9.28 0.44 - 1.00 mg/dL   Calcium  8.1 (L) 8.9 - 10.3 mg/dL   Total Protein 7.1 6.5 - 8.1 g/dL   Albumin  1.6 (L) 3.5 - 5.0  g/dL   AST 25 15 - 41 U/L   ALT 26 0 - 44 U/L   Alkaline Phosphatase 183 (H) 38 - 126 U/L   Total Bilirubin 0.9 0.0 - 1.2 mg/dL   GFR, Estimated >39 >39 mL/min    Comment: (NOTE) Calculated using the CKD-EPI Creatinine Equation (2021)    Anion gap 8 5 - 15    Comment: Performed at [facility], [address] [place], [place] [address]  Magnesium      Status: None   Collection Time: 06/02/24  2:55 AM  Result Value Ref Range   Magnesium  1.8 1.7 - 2.4 mg/dL    Comment: Performed at [facility], [address], [place], [place] [address]  Phosphorus     Status: None   Collection Time: 06/02/24  2:55 AM  Result Value Ref Range   Phosphorus 3.3 2.5 - 4.6 mg/dL    Comment: Performed at [facility], [address], [place], [place] [address]  Triglycerides     Status: None   Collection Time: 06/02/24  2:55 AM  Result Value Ref Range   Triglycerides 137 <150 mg/dL    Comment: Performed at [facility], [address], [place], [place] [address]  CBC     Status: Abnormal   Collection Time: 06/02/24  2:55 AM  Result Value Ref Range   WBC 9.0 4.0 - 10.5 K/uL   RBC 3.23 (L) 3.87 - 5.11 MIL/uL   Hemoglobin 9.0 (L) 12.0 - 15.0 g/dL   HCT 70.1 (L) 63.9 - 53.9 %   MCV 92.3 80.0 - 100.0 fL   MCH 27.9 26.0 - 34.0 pg   MCHC 30.2 30.0 - 36.0 g/dL   RDW 83.7 (H) 88.4 - 84.4 %   Platelets 424 (H) 150 - 400 K/uL   nRBC 0.0 0.0 - 0.2 %    Comment: Performed at [facility], [address], [place], [place] [address]    Imaging / Studies: No results found.     Medications / Allergies: per chart  Antibiotics: Anti-infectives (From admission, onward)    Start     Dose/Rate Route Frequency Ordered Stop   05/29/24 1500  DAPTOmycin  (CUBICIN ) IVPB 500 mg/79mL premix        8 mg/kg  60.9 kg 100 mL/hr over 30 Minutes Intravenous Daily 05/29/24 1359     05/28/24 2100  vancomycin  (VANCOREADY) IVPB  750 mg/150 mL  Status:  Discontinued        750 mg 150 mL/hr over 60 Minutes Intravenous Every 12 hours 05/28/24 1419 05/29/24 1359   05/27/24 0800  vancomycin  (VANCOCIN ) IVPB 1000 mg/200 mL premix  Status:  Discontinued        1,000 mg 200 mL/hr over 60 Minutes Intravenous Every 24 hours 05/26/24 1144 05/28/24 1419   05/23/24 1400  piperacillin -tazobactam (ZOSYN ) IVPB 3.375 g        3.375 g 12.5 mL/hr over 240 Minutes Intravenous Every 8 hours 05/23/24 0804     05/23/24 1200  micafungin  (MYCAMINE ) 100 mg in sodium chloride  0.9 % 100 mL IVPB        100 mg 105 mL/hr over 1 Hours Intravenous Every 24 hours 05/23/24 0755 06/06/24 1159   05/22/24 0800  vancomycin  (VANCOCIN ) IVPB 1000 mg/200 mL premix  Status:  Discontinued        1,000 mg 200 mL/hr over 60 Minutes Intravenous Every 24 hours 05/21/24 0634 05/26/24 0806   05/21/24 0730  vancomycin  (VANCOREADY) IVPB 1250 mg/250 mL        1,250 mg 166.7 mL/hr over 90 Minutes Intravenous  Once 05/21/24 0634 05/21/24 1043   05/19/24 1400  piperacillin -tazobactam (ZOSYN ) IVPB 3.375 g  Status:  Discontinued        3.375 g 12.5 mL/hr over 240 Minutes Intravenous Every 8 hours 05/19/24 1303 05/23/24 0804   05/09/24 0900  erythromycin  250 mg in sodium chloride  0.9 % 100 mL IVPB        250 mg 100 mL/hr over 60 Minutes Intravenous Every 8 hours 05/09/24 0732 05/11/24 0044   05/08/24 2200  cefoTEtan  (CEFOTAN ) 2 g in sodium chloride  0.9 % 100 mL IVPB        2 g 200 mL/hr over 30 Minutes Intravenous Every 12 hours 05/08/24 1759 05/09/24 0749   05/08/24 1400  neomycin  (MYCIFRADIN ) tablet 1,000 mg  Status:  Discontinued       Placed in And Linked Group   1,000 mg Oral 3 times per day 05/08/24 1119 05/08/24 1120   05/08/24 1400  metroNIDAZOLE  (FLAGYL ) tablet 1,000 mg  Status:  Discontinued       Placed in And Linked Group   1,000 mg Oral 3 times per day 05/08/24 1119 05/08/24 1120   05/08/24 1130  cefoTEtan  (CEFOTAN ) 2 g in sodium chloride  0.9 % 100 mL  IVPB        2 g 200 mL/hr over 30 Minutes Intravenous On call to O.R. 05/08/24 1119 05/09/24 0749         Note: Portions of this report may have been transcribed using voice recognition software. Every effort was made to ensure accuracy; however, inadvertent computerized transcription errors may be present.   Any transcriptional errors that result from this process are unintentional.    Elspeth KYM Schultze, MD, FACS, MASCRS Esophageal, Gastrointestinal & Colorectal Surgery Robotic and Minimally Invasive Surgery  [facility] [address] [place], [place] [address] [phone] Fax [phone] Main  CONTACT INFORMATION: Weekday (9AM-5PM): Call [facility] main office at [phone] Weeknight (5PM-9AM) or Weekend/Holiday: Check EPIC Web Links tab & use AMION (password  [email or web address]) for General Surgery [facility] coverage  Please, DO NOT use SecureChat  (it is not reliable communication to reach operating surgeons & will lead to a delay in care).   Epic staff messaging available for outptient concerns needing 1-2 business  day response.      06/02/2024  7:05 AM

## 2024-06-02 NOTE — Progress Notes (Signed)
 PT Cancellation Note  Patient Details Name: OTHA RICKLES MRN: [id number] DOB: [birth year]   Cancelled Treatment:     Pt declined any activity.  I just can't do it, stated Pt.  Max c/o weakness/fatigue and ABD discomfort with fear of creating more discomfort if she moves.  Pt also express worry about her surgery tomorrow.  Offered comfort and applied lotion to her itchy back.  Will continue to follow and attempt to see another day as schedule permits.  Katheryn Leap  PTA Acute  Rehabilitation Services Office M-F          [phone]

## 2024-06-03 ENCOUNTER — Inpatient Hospital Stay (HOSPITAL_COMMUNITY)

## 2024-06-03 ENCOUNTER — Inpatient Hospital Stay (HOSPITAL_COMMUNITY): Payer: Self-pay | Admitting: Anesthesiology

## 2024-06-03 ENCOUNTER — Encounter (HOSPITAL_COMMUNITY)
Admission: RE | Disposition: A | Discharge status: Hospice | Payer: Self-pay | Source: Home / Self Care | Attending: Surgery

## 2024-06-03 ENCOUNTER — Other Ambulatory Visit: Payer: Self-pay

## 2024-06-03 DIAGNOSIS — T81320A Disruption or dehiscence of gastrointestinal tract anastomosis, repair, or closure, initial encounter: Secondary | ICD-10-CM | POA: Diagnosis not present

## 2024-06-03 DIAGNOSIS — I4891 Unspecified atrial fibrillation: Secondary | ICD-10-CM

## 2024-06-03 DIAGNOSIS — I10 Essential (primary) hypertension: Secondary | ICD-10-CM | POA: Diagnosis not present

## 2024-06-03 DIAGNOSIS — K5790 Diverticulosis of intestine, part unspecified, without perforation or abscess without bleeding: Secondary | ICD-10-CM | POA: Diagnosis not present

## 2024-06-03 DIAGNOSIS — F419 Anxiety disorder, unspecified: Secondary | ICD-10-CM

## 2024-06-03 DIAGNOSIS — E43 Unspecified severe protein-calorie malnutrition: Secondary | ICD-10-CM | POA: Diagnosis not present

## 2024-06-03 HISTORY — PX: LAPAROSCOPIC DIVERTED COLOSTOMY: SHX5892

## 2024-06-03 HISTORY — PX: LAPAROSCOPIC GASTROSTOMY: SHX5896

## 2024-06-03 HISTORY — PX: LAPAROSCOPY: SHX197

## 2024-06-03 HISTORY — PX: LAPAROTOMY: SHX154

## 2024-06-03 HISTORY — PX: INCISION AND DRAINAGE ABSCESS: SHX5864

## 2024-06-03 LAB — GLUCOSE, CAPILLARY
Glucose-Capillary: 152 mg/dL — ABNORMAL HIGH (ref 70–99)
Glucose-Capillary: 163 mg/dL — ABNORMAL HIGH (ref 70–99)

## 2024-06-03 LAB — BLOOD GAS, ARTERIAL
Acid-base deficit: 4.5 mmol/L — ABNORMAL HIGH (ref 0.0–2.0)
Bicarbonate: 20.3 mmol/L (ref 20.0–28.0)
Drawn by: 31394
FIO2: 80 %
MECHVT: 450 mL
O2 Saturation: 100 %
PEEP: 5 cmH2O
Patient temperature: 36.3
RATE: 20 {breaths}/min
pCO2 arterial: 35 mmHg (ref 32–48)
pH, Arterial: 7.37 (ref 7.35–7.45)
pO2, Arterial: 197 mmHg — ABNORMAL HIGH (ref 83–108)

## 2024-06-03 LAB — MINIMUM INHIBITORY CONC. (1 DRUG)

## 2024-06-03 LAB — MIC RESULT

## 2024-06-03 SURGERY — CREATION, GASTROSTOMY, LAPAROSCOPIC
Anesthesia: General | Site: Abdomen

## 2024-06-03 MED ORDER — STERILE WATER FOR IRRIGATION IR SOLN
Status: DC | PRN
  Administered 2024-06-03: 1000 mL

## 2024-06-03 MED ORDER — BUPIVACAINE-EPINEPHRINE (PF) 0.25% -1:200000 IJ SOLN
INTRAMUSCULAR | Status: AC
  Filled 2024-06-03: qty 30

## 2024-06-03 MED ORDER — ORAL CARE MOUTH RINSE: 15.0000 mL | Freq: Once | OROMUCOSAL | Status: AC

## 2024-06-03 MED ORDER — ORAL CARE MOUTH RINSE
15.0000 mL | OROMUCOSAL | Status: DC
  Administered 2024-06-03 – 2024-06-04 (×8): 15 mL via OROMUCOSAL

## 2024-06-03 MED ORDER — TRAVASOL 10 % IV SOLN
INTRAVENOUS | Status: AC
  Filled 2024-06-03: qty 940.8

## 2024-06-03 MED ORDER — ONDANSETRON HCL 4 MG/2ML IJ SOLN
INTRAMUSCULAR | Status: AC
  Filled 2024-06-03: qty 2

## 2024-06-03 MED ORDER — LACTATED RINGERS IV SOLN: INTRAVENOUS | Status: DC

## 2024-06-03 MED ORDER — VITAMIN B-12 1000 MCG PO TABS: 1000.0000 ug | ORAL_TABLET | Freq: Every day | ORAL | Status: DC

## 2024-06-03 MED ORDER — PROPOFOL 10 MG/ML IV BOLUS
INTRAVENOUS | Status: AC
  Filled 2024-06-03: qty 20

## 2024-06-03 MED ORDER — PHENYLEPHRINE HCL (PRESSORS) 10 MG/ML IV SOLN
INTRAVENOUS | Status: AC
  Filled 2024-06-03: qty 1

## 2024-06-03 MED ORDER — PROPOFOL 500 MG/50ML IV EMUL
INTRAVENOUS | Status: DC | PRN
  Administered 2024-06-03: 50 ug/kg/min via INTRAVENOUS

## 2024-06-03 MED ORDER — PROPOFOL 10 MG/ML IV BOLUS
INTRAVENOUS | Status: DC | PRN
  Administered 2024-06-03: 60 mg via INTRAVENOUS
  Administered 2024-06-03: 40 mg via INTRAVENOUS

## 2024-06-03 MED ORDER — MIDAZOLAM HCL 2 MG/2ML IJ SOLN
INTRAMUSCULAR | Status: AC
  Filled 2024-06-03: qty 2

## 2024-06-03 MED ORDER — CHLORHEXIDINE GLUCONATE CLOTH 2 % EX PADS
6.0000 | MEDICATED_PAD | Freq: Every day | CUTANEOUS | Status: AC
  Administered 2024-06-03 – 2024-06-07 (×4): 6 via TOPICAL

## 2024-06-03 MED ORDER — LACTATED RINGERS IV SOLN: INTRAVENOUS | Status: DC | PRN

## 2024-06-03 MED ORDER — ESMOLOL HCL 100 MG/10ML IV SOLN
INTRAVENOUS | Status: AC
  Filled 2024-06-03: qty 10

## 2024-06-03 MED ORDER — PHENYLEPHRINE HCL-NACL 20-0.9 MG/250ML-% IV SOLN
INTRAVENOUS | Status: DC | PRN
  Administered 2024-06-03: 40 ug/min via INTRAVENOUS

## 2024-06-03 MED ORDER — MIDAZOLAM HCL 2 MG/2ML IJ SOLN
INTRAMUSCULAR | Status: DC | PRN
  Administered 2024-06-03: 2 mg via INTRAVENOUS

## 2024-06-03 MED ORDER — FENTANYL CITRATE (PF) 100 MCG/2ML IJ SOLN
INTRAMUSCULAR | Status: AC
  Filled 2024-06-03: qty 2

## 2024-06-03 MED ORDER — LOPERAMIDE HCL 1 MG/7.5ML PO SUSP
4.0000 mg | Freq: Three times a day (TID) | ORAL | Status: DC
  Filled 2024-06-03: qty 30

## 2024-06-03 MED ORDER — FENTANYL CITRATE (PF) 100 MCG/2ML IJ SOLN
INTRAMUSCULAR | Status: DC | PRN
  Administered 2024-06-03: 25 ug via INTRAVENOUS

## 2024-06-03 MED ORDER — ALBUMIN HUMAN 5 % IV SOLN: INTRAVENOUS | Status: DC | PRN

## 2024-06-03 MED ORDER — NEOMYCIN SULFATE 500 MG PO TABS
500.0000 mg | ORAL_TABLET | Freq: Three times a day (TID) | ORAL | Status: DC
  Filled 2024-06-03 (×3): qty 1

## 2024-06-03 MED ORDER — 0.9 % SODIUM CHLORIDE (POUR BTL) OPTIME
TOPICAL | Status: DC | PRN
  Administered 2024-06-03 (×2): 1000 mL

## 2024-06-03 MED ORDER — NOREPINEPHRINE 4 MG/250ML-% IV SOLN
INTRAVENOUS | Status: DC | PRN
  Administered 2024-06-03 (×2): 10 ug/min via INTRAVENOUS

## 2024-06-03 MED ORDER — LIDOCAINE HCL (CARDIAC) PF 100 MG/5ML IV SOSY
PREFILLED_SYRINGE | INTRAVENOUS | Status: DC | PRN
  Administered 2024-06-03: 60 mg via INTRAVENOUS

## 2024-06-03 MED ORDER — LIDOCAINE HCL (PF) 2 % IJ SOLN
INTRAMUSCULAR | Status: AC
  Filled 2024-06-03: qty 5

## 2024-06-03 MED ORDER — PANTOPRAZOLE SODIUM 40 MG IV SOLR
40.0000 mg | Freq: Every day | INTRAVENOUS | Status: DC
  Administered 2024-06-04 – 2024-06-09 (×6): 40 mg via INTRAVENOUS
  Filled 2024-06-03 (×6): qty 10

## 2024-06-03 MED ORDER — FENTANYL CITRATE PF 50 MCG/ML IJ SOSY
25.0000 ug | PREFILLED_SYRINGE | INTRAMUSCULAR | Status: DC | PRN
  Administered 2024-06-03: 50 ug via INTRAVENOUS
  Filled 2024-06-03: qty 1

## 2024-06-03 MED ORDER — APIXABAN 2.5 MG PO TABS: 2.5000 mg | ORAL_TABLET | Freq: Two times a day (BID) | ORAL | Status: DC

## 2024-06-03 MED ORDER — ALBUMIN HUMAN 5 % IV SOLN
INTRAVENOUS | Status: AC
Start: 2024-06-03 — End: 2024-06-03
  Filled 2024-06-03: qty 500

## 2024-06-03 MED ORDER — BUPIVACAINE LIPOSOME 1.3 % IJ SUSP
INTRAMUSCULAR | Status: AC
  Filled 2024-06-03: qty 20

## 2024-06-03 MED ORDER — MEGESTROL ACETATE 40 MG PO TABS
40.0000 mg | ORAL_TABLET | Freq: Every day | ORAL | Status: DC
  Filled 2024-06-03: qty 1

## 2024-06-03 MED ORDER — FAMOTIDINE 20 MG PO TABS
20.0000 mg | ORAL_TABLET | Freq: Two times a day (BID) | ORAL | Status: DC
  Filled 2024-06-03: qty 1

## 2024-06-03 MED ORDER — PROPOFOL 1000 MG/100ML IV EMUL
INTRAVENOUS | Status: AC
  Filled 2024-06-03: qty 100

## 2024-06-03 MED ORDER — CHLORHEXIDINE GLUCONATE 0.12 % MT SOLN
15.0000 mL | Freq: Once | OROMUCOSAL | Status: AC
  Administered 2024-06-03: 15 mL via OROMUCOSAL

## 2024-06-03 MED ORDER — ACETAMINOPHEN 160 MG/5ML PO SOLN
500.0000 mg | Freq: Three times a day (TID) | ORAL | Status: DC
  Administered 2024-06-04: 500 mg

## 2024-06-03 MED ORDER — EPINEPHRINE 1 MG/10ML IJ SOSY
PREFILLED_SYRINGE | INTRAMUSCULAR | Status: DC | PRN
  Administered 2024-06-03 (×5): 10 ug via INTRAVENOUS

## 2024-06-03 MED ORDER — METRONIDAZOLE 500 MG PO TABS: 500.0000 mg | ORAL_TABLET | Freq: Three times a day (TID) | ORAL | Status: DC

## 2024-06-03 MED ORDER — BUPIVACAINE LIPOSOME 1.3 % IJ SUSP: INTRAMUSCULAR | Status: DC | PRN

## 2024-06-03 MED ORDER — ADULT MULTIVITAMIN LIQUID CH
15.0000 mL | Freq: Every day | ORAL | Status: DC
  Administered 2024-06-04: 15 mL
  Filled 2024-06-03: qty 15

## 2024-06-03 MED ORDER — ROCURONIUM BROMIDE 10 MG/ML (PF) SYRINGE
PREFILLED_SYRINGE | INTRAVENOUS | Status: DC | PRN
  Administered 2024-06-03 (×3): 20 mg via INTRAVENOUS
  Administered 2024-06-03: 40 mg via INTRAVENOUS

## 2024-06-03 MED ORDER — ROCURONIUM BROMIDE 10 MG/ML (PF) SYRINGE
PREFILLED_SYRINGE | INTRAVENOUS | Status: AC
Start: 2024-06-03 — End: 2024-06-03
  Filled 2024-06-03: qty 10

## 2024-06-03 MED ORDER — MUPIROCIN 2 % EX OINT
1.0000 | TOPICAL_OINTMENT | Freq: Two times a day (BID) | CUTANEOUS | Status: AC
  Administered 2024-06-03 – 2024-06-08 (×9): 1 via NASAL
  Filled 2024-06-03 (×2): qty 22

## 2024-06-03 MED ORDER — ONDANSETRON HCL 4 MG/2ML IJ SOLN
INTRAMUSCULAR | Status: DC | PRN
  Administered 2024-06-03: 4 mg via INTRAVENOUS

## 2024-06-03 MED ORDER — PROPOFOL 1000 MG/100ML IV EMUL
0.0000 ug/kg/min | INTRAVENOUS | Status: DC
  Administered 2024-06-03 – 2024-06-04 (×2): 50 ug/kg/min via INTRAVENOUS
  Filled 2024-06-03 (×2): qty 100

## 2024-06-03 MED ORDER — PHENYLEPHRINE 80 MCG/ML (10ML) SYRINGE FOR IV PUSH (FOR BLOOD PRESSURE SUPPORT)
PREFILLED_SYRINGE | INTRAVENOUS | Status: DC | PRN
  Administered 2024-06-03: 160 ug via INTRAVENOUS
  Administered 2024-06-03: 240 ug via INTRAVENOUS

## 2024-06-03 SURGICAL SUPPLY — 59 items
BAG COUNTER SPONGE SURGICOUNT (BAG) IMPLANT
BAG URINE DRAIN 2000ML AR STRL (UROLOGICAL SUPPLIES) IMPLANT
BLADE SURG SZ10 CARB STEEL (BLADE) ×3 IMPLANT
CANISTER WOUNDNEG PRESSURE 500 (CANNISTER) IMPLANT
CATH FOLEY SILVER 30CC 28FR (CATHETERS) IMPLANT
CHLORAPREP W/TINT 26 (MISCELLANEOUS) ×3 IMPLANT
COVER MAYO STAND STRL (DRAPES) ×3 IMPLANT
DRAIN CHANNEL 19F RND (DRAIN) IMPLANT
DRAPE INCISE IOBAN 66X45 STRL (DRAPES) IMPLANT
DRAPE LAPAROSCOPIC ABDOMINAL (DRAPES) ×3 IMPLANT
DRAPE SHEET LG 3/4 BI-LAMINATE (DRAPES) IMPLANT
DRAPE UTILITY XL STRL (DRAPES) ×3 IMPLANT
DRAPE WARM FLUID 44X44 (DRAPES) ×3 IMPLANT
DRSG OPSITE POSTOP 4X10 (GAUZE/BANDAGES/DRESSINGS) IMPLANT
DRSG OPSITE POSTOP 4X6 (GAUZE/BANDAGES/DRESSINGS) IMPLANT
DRSG OPSITE POSTOP 4X8 (GAUZE/BANDAGES/DRESSINGS) IMPLANT
DRSG TEGADERM 2-3/8X2-3/4 SM (GAUZE/BANDAGES/DRESSINGS) ×3 IMPLANT
DRSG TEGADERM 4X4.75 (GAUZE/BANDAGES/DRESSINGS) ×3 IMPLANT
DRSG VAC GRANUFOAM SM (GAUZE/BANDAGES/DRESSINGS) IMPLANT
ELECT REM PT RETURN 15FT ADLT (MISCELLANEOUS) ×3 IMPLANT
EVACUATOR SILICONE 100CC (DRAIN) IMPLANT
G-TUBE MIC BOLUS 22FR ENFIT (TUBING) IMPLANT
GAUZE SPONGE 2X2 8PLY STRL LF (GAUZE/BANDAGES/DRESSINGS) IMPLANT
GAUZE SPONGE 4X4 12PLY STRL (GAUZE/BANDAGES/DRESSINGS) ×3 IMPLANT
GLOVE ECLIPSE 8.0 STRL XLNG CF (GLOVE) ×3 IMPLANT
GLOVE INDICATOR 8.0 STRL GRN (GLOVE) ×3 IMPLANT
GOWN STRL REUS W/ TWL XL LVL3 (GOWN DISPOSABLE) ×12 IMPLANT
HANDLE SUCTION POOLE (INSTRUMENTS) ×3 IMPLANT
KIT BASIN OR (CUSTOM PROCEDURE TRAY) ×3 IMPLANT
KIT TURNOVER KIT A (KITS) ×3 IMPLANT
LEGGING LITHOTOMY PAIR STRL (DRAPES) ×3 IMPLANT
NDL INSUFFLATION 14GA 120MM (NEEDLE) IMPLANT
NEEDLE INSUFFLATION 14GA 120MM (NEEDLE) ×3 IMPLANT
PACK GENERAL/GYN (CUSTOM PROCEDURE TRAY) ×3 IMPLANT
RELOAD PROXIMATE 75MM BLUE (ENDOMECHANICALS) IMPLANT
RELOAD STAPLE 75 3.8 BLU REG (ENDOMECHANICALS) IMPLANT
RETRACTOR WND ALEXIS 25 LRG (MISCELLANEOUS) IMPLANT
SPIKE FLUID TRANSFER (MISCELLANEOUS) ×3 IMPLANT
SPONGE DRAIN TRACH 4X4 STRL 2S (GAUZE/BANDAGES/DRESSINGS) IMPLANT
STAPLER 90 3.5 STD SLIM (STAPLE) IMPLANT
STAPLER PROXIMATE 75MM BLUE (STAPLE) IMPLANT
STAPLER SKIN PROX 35W (STAPLE) ×3 IMPLANT
SUT MNCRL AB 4-0 PS2 18 (SUTURE) ×3 IMPLANT
SUT PDS AB 1 CT 36 (SUTURE) IMPLANT
SUT PDS AB 1 TP1 96 (SUTURE) ×6 IMPLANT
SUT PDS AB 2-0 CT2 27 (SUTURE) IMPLANT
SUT PROLENE 2 0 SH DA (SUTURE) IMPLANT
SUT SILK 2 0 SH (SUTURE) IMPLANT
SUT SILK 2 0 SH CR/8 (SUTURE) ×3 IMPLANT
SUT SILK 2-0 18XBRD TIE 12 (SUTURE) IMPLANT
SUT VIC AB 2-0 SH 18 (SUTURE) IMPLANT
SUT VICRYL 0 UR6 27IN ABS (SUTURE) ×3 IMPLANT
SYR BULB IRRIG 60ML STRL (SYRINGE) ×3 IMPLANT
TAPE UMBILICAL 1/8 X36 TWILL (MISCELLANEOUS) ×3 IMPLANT
TOWEL OR 17X26 10 PK STRL BLUE (TOWEL DISPOSABLE) ×6 IMPLANT
TRAY FOLEY MTR SLVR 14FR STAT (SET/KITS/TRAYS/PACK) IMPLANT
TRAY FOLEY MTR SLVR 16FR STAT (SET/KITS/TRAYS/PACK) IMPLANT
TROCAR Z-THREAD OPTICAL 5X100M (TROCAR) IMPLANT
TUBE GASTRO BOLUS 22FR ENFIT (TUBING) ×3 IMPLANT

## 2024-06-03 NOTE — Transfer of Care (Signed)
 Immediate Anesthesia Transfer of Care Note  Patient: Elizabeth Odom  Procedure(s) Performed: CREATION, GASTROSTOMY, LAPAROSCOPIC CREATION, COLOSTOMY, DIVERTING, LAPAROSCOPIC (Abdomen) LAPAROSCOPY, DIAGNOSTIC (Abdomen) LAPAROTOMY, EXPLORATORY (Abdomen) INCISION AND DRAINAGE, ABSCESS (Abdomen)  Patient Location: PACU and ICU  Anesthesia Type:General  Level of Consciousness: sedated  Airway & Oxygen Therapy: Patient remains intubated per anesthesia plan  Post-op Assessment: Report given to RN and Post -op Vital signs reviewed and stable  Post vital signs: Reviewed and stable  Last Vitals:  Vitals Value Taken Time  BP 161/60   Temp    Pulse 82 06/03/24 16:08  Resp 20 06/03/24 16:08  SpO2 100 % 06/03/24 16:08  Vitals shown include unfiled device data.  Last Pain:  Vitals:   06/03/24 1200  TempSrc: Oral  PainSc:       Patients Stated Pain Goal: 0 (05/24/24 0740)  Complications: No notable events documented.

## 2024-06-03 NOTE — Anesthesia Procedure Notes (Signed)
 Procedure Name: Intubation Date/Time: 06/03/2024 12:49 PM  Performed by: Brandy Almarie BROCKS, CRNAPre-anesthesia Checklist: Patient identified, Emergency Drugs available, Suction available and Patient being monitored Patient Re-evaluated:Patient Re-evaluated prior to induction Oxygen Delivery Method: Circle system utilized Preoxygenation: Pre-oxygenation with 100% oxygen Induction Type: IV induction Ventilation: Mask ventilation without difficulty Laryngoscope Size: Mac and 4 Grade View: Grade I Tube type: Oral Tube size: 7.0 mm Number of attempts: 1 Airway Equipment and Method: Stylet Placement Confirmation: ETT inserted through vocal cords under direct vision, positive ETCO2 and breath sounds checked- equal and bilateral Secured at: 21 cm Tube secured with: Tape Dental Injury: Teeth and Oropharynx as per pre-operative assessment

## 2024-06-03 NOTE — Progress Notes (Signed)
 OT Cancellation Note  Patient Details Name: BREHANNA DEVENY MRN: [id number] DOB: [birth year]   Cancelled Treatment:    Reason Eval/Treat Not Completed: Patient declined, no reason specified Patient declined reporting she has procedure later today. OT to continue to follow and check back as schedule will allow on 6/25. Geofm LEYLAND, MS Acute Rehabilitation Department Office# [phone]  06/03/2024, 9:34 AM

## 2024-06-03 NOTE — Consult Note (Signed)
 WOC team received consult for new diverting transverse colostomy placed by Dr. Sheldon 06/03/2024.  WOC team marked patient preoperatively and will follow for education and support of new ostomy.   Thanks,    [employer] MSN, RN-BC, [employer]

## 2024-06-03 NOTE — Interval H&P Note (Signed)
 History and Physical Interval Note:  06/03/2024 7:08 AM  Elizabeth Odom  has presented today for surgery, with the diagnosis of DELAYED COLORECTAL ANASTOMOTIC LEAK, NEED FOR FECAL DIVERSION.  The various methods of treatment have been discussed with the patient and family. After consideration of risks, benefits and other options for treatment, the patient has consented to  Procedure(s) with comments: CLOSURE, ILEOSTOMY (N/A) CREATION, GASTROSTOMY, LAPAROSCOPIC (N/A) - FEEDING GASROSTOMY TUBE SIGMOIDOSCOPY, FLEXIBLE (N/A) as a surgical intervention.  The patient's history has been reviewed, patient examined, no change in status, stable for surgery.  I have reviewed the patient's chart and labs.  Questions were answered to the patient's satisfaction.    I have re-reviewed the the patient's records, history, medications, and allergies.  I have re-examined the patient.  I again discussed intraoperative plans and goals of post-operative recovery.  The patient agrees to proceed.  Elizabeth Odom  1941-10-07 [id number]  Patient Care Team: Jesus Bernardino MATSU, MD as PCP - General (Internal Medicine) Elmira Newman PARAS, MD as PCP - Cardiology (Cardiology) [name], MD as Consulting Physician (Gastroenterology) Rubin Calamity, MD as Consulting Physician (General Surgery) [name], MD (Neurology) [name], MD as Consulting Physician (Urology) [name], MD as Consulting Physician (Cardiology) [name], MD as Consulting Physician (Colon and Rectal Surgery)  Patient Active Problem List   Diagnosis Date Noted   Hypokalemia 05/15/2024   Protein-calorie malnutrition, severe 05/12/2024   History of anemia 05/08/2024   Pre-diabetes 05/08/2024   Diverticular disease 05/08/2024   Premature atrial contraction 04/08/2024   Mixed hyperlipidemia 04/08/2024   Essential hypertension 04/07/2024   Diverticular disease of left colon 04/07/2024   History of DVT (deep vein  thrombosis) 04/07/2024   History of pulmonary embolus (PE) 04/07/2024   Presence of IVC filter 04/07/2024   Stricture of sigmoid colon (HCC) 04/07/2024   Irritant contact dermatitis associated with fecal stoma 03/10/2024   Pancreatic cyst 01/02/2024   Hypoalbuminemia 01/02/2024   Acquired renal cyst of right kidney 01/02/2024   Fatty liver 01/02/2024   Ambulates with cane 01/02/2024   Iron deficiency anemia due to chronic blood loss 10/31/2023   Acute pulmonary embolism (HCC) 10/31/2023   Hyponatremia 10/31/2023   Acute urinary retention 10/31/2023   Malnutrition of moderate degree 10/17/2023   High risk medication use 10/16/2023   Pain 10/16/2023   Pressure injury of skin 10/16/2023   Left leg DVT (HCC) 10/05/2023   ABLA (acute blood loss anemia) 10/05/2023   Palliative care encounter 10/05/2023   Counseling and coordination of care 10/05/2023   Need for emotional support 10/05/2023   Acute colitis 09/27/2023   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   Restless leg    Multiple fractures of cervical spine (HCC) 04/09/2016   Cervical spine fracture, initial encounter 04/09/2016   Syncope and collapse 04/09/2016   A-fib (HCC) 04/09/2016   Acute head injury 04/09/2016    Past Medical History:  Diagnosis Date   A-fib (HCC) 04/09/2016   Acute head injury 04/09/2016   Anemia    Blood dyscrasia    Hx DVT / PE   Cervical cancer (HCC)    Cervical spine fracture, initial encounter 04/09/2016   Claustrophobia    extremely claustrophobic   Dupuytren disease of palm of both hands    Essential hypertension    Fatty liver    History of kidney stones    MCI (mild cognitive impairment) with memory loss    Multiple  fractures of cervical spine (HCC) 04/09/2016   Restless leg    Syncope and collapse 04/09/2016    Past Surgical History:  Procedure Laterality Date   APPENDECTOMY     BIOPSY  09/29/2023   Procedure: BIOPSY;  Surgeon: Saintclair Jasper, MD;   Location: [facility];  Service: Gastroenterology;;   breast lump removal Left    CERVICAL LAMINECTOMY     COLECTOMY WITH COLOSTOMY CREATION/HARTMANN PROCEDURE N/A 10/01/2023   Procedure: COLECTOMY WITH COLOSTOMY CREATION/HARTMANN PROCEDURE;  Surgeon: Rubin Calamity, MD;  Location: [facility];  Service: General;  Laterality: N/A;   complete hysterectomy     CYSTOSCOPY WITH INDOCYANINE GREEN  IMAGING (ICG) N/A 05/08/2024   Procedure: CYSTOSCOPY WITH INDOCYANINE GREEN  IMAGING (ICG);  Surgeon: Shane Steffan BROCKS, MD;  Location: [facility];  Service: Urology;  Laterality: N/A;   EP IMPLANTABLE DEVICE N/A 04/10/2016   Procedure: Loop Recorder Insertion;  Surgeon: Danelle LELON Birmingham, MD;  [facility];  Service: Cardiovascular;  Laterality: N/A;   FLEXIBLE SIGMOIDOSCOPY N/A 09/29/2023   Procedure: FLEXIBLE SIGMOIDOSCOPY;  Surgeon: Saintclair Jasper, MD;  Location: [facility];  Service: Gastroenterology;  Laterality: N/A;   FLEXIBLE SIGMOIDOSCOPY N/A 05/08/2024   Procedure: SIGMOIDOSCOPY, FLEXIBLE;  Surgeon: [name], MD;  Location: [facility];  Service: General;  Laterality: N/A;   HARVEST BONE GRAFT     IR IVC FILTER PLMT / S&I PORTER GUID/MOD SED  10/05/2023   IR SINUS/FIST TUBE CHK-NON GI  10/22/2023   IR SINUS/FIST TUBE CHK-NON GI  11/05/2023   LAPAROSCOPIC LYSIS OF ADHESIONS N/A 05/08/2024   Procedure: LYSIS, ADHESIONS, LAPAROSCOPIC;  Surgeon: [name], MD;  Location: [facility];  Service: General;  Laterality: N/A;  LYSIS OF ADHESIONS   LOOP RECORDER REMOVAL N/A 07/08/2018   Procedure: LOOP RECORDER REMOVAL;  Surgeon: Birmingham Danelle LELON, MD;  [facility];  Service: Cardiovascular;  Laterality: N/A;   XI ROBOTIC ASSISTED COLOSTOMY TAKEDOWN N/A 05/08/2024   Procedure: CLOSURE, COLOSTOMY, ROBOT-ASSISTED -ROBOTIC RECTOSIGMOID RESECTION (LAR) -TAKEDOWN OF END COLOSTOMY WITH ANASTOMOSIS -RESECTION OF SMALL INTESTINE WITH ANASTOMOSIS -SMALL BOWEL REPAIR -LYSIS OF ADHESIONS x 115 MINUTES  (66% OF CASE),  -INTRAOPERATIVE ASSESSMENT OF TISSUE VASCULAR PERFUSION USING ICG (indocyanine green ) IMMUNOFLUORESCENCE,  -TRANSVERSUS ABDOMINIS PLANE (TAP) BLOCK - BILATE    Social History   Socioeconomic History   Marital status: Widowed    Spouse name: Not on file   Number of children: 3   Years of education: schooling in [place]   Highest education level: Not on file  Occupational History   Not on file  Tobacco Use   Smoking status: Never   Smokeless tobacco: Never  Vaping Use   Vaping status: Never Used  Substance and Sexual Activity   Alcohol  use: Never    Alcohol /week: 0.0 standard drinks of alcohol    Drug use: Never   Sexual activity: Not Currently  Other Topics Concern   Not on file  Social History Narrative   Patient is originally from [place], [place].   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    Her emergency contact/medical decision maker is her granddaughter:  [name] [phone]).   Social Drivers of Corporate investment banker Strain: Not on file  Food Insecurity: No Food Insecurity (05/09/2024)   Hunger Vital Sign    Worried About Running Out of Food in the Last Year: Never true    Ran Out of Food in the Last  Year: Never true  Transportation Needs: No Transportation Needs (05/09/2024)   PRAPARE - Administrator, Civil Service (Medical): No    Lack of Transportation (Non-Medical): No  Physical Activity: Not on file  Stress: Not on file  Social Connections: Unknown (05/09/2024)   Social Connection and Isolation Panel    Frequency of Communication with Friends and Family: Three times a week    Frequency of Social Gatherings with Friends and Family: Once a week    Attends Religious Services: 1 to 4 times per year    Active Member of [employer] or Organizations: Yes    Attends Banker Meetings: 1 to 4 times per year    Marital Status: Patient declined  Intimate  Partner Violence: Not At Risk (05/09/2024)   Humiliation, Afraid, Rape, and Kick questionnaire    Fear of Current or Ex-Partner: No    Emotionally Abused: No    Physically Abused: No    Sexually Abused: No    Family History  Problem Relation Age of Onset   Other Mother        thrombosis   Cancer Sister    Diabetes Sister    Stroke Daughter    Thyroid  disease Daughter     Medications Prior to Admission  Medication Sig Dispense Refill Last Dose/Taking   amLODipine  (NORVASC ) 5 MG tablet Take 1 tablet (5 mg total) by mouth daily. (Patient taking differently: Take 5 mg by mouth at bedtime.) 90 tablet 3 05/07/2024 Evening   ascorbic acid  (VITAMIN C ) 250 MG CHEW Chew 250 mg by mouth daily.   Past Week   aspirin  EC 81 MG tablet Take 81 mg by mouth daily. Swallow whole.   Past Week   cyanocobalamin  (VITAMIN B12) 1000 MCG tablet Take 1,000 mcg by mouth daily.   Past Week   FIBER GUMMIES PO Take 1 capsule by mouth daily.   Past Week   Multiple Vitamin (MULTIVITAMIN WITH MINERALS) TABS tablet Take 1 tablet by mouth daily. 120 tablet 0 Past Week   Omega-3 Fatty Acids (OMEGA 3 500 PO) Take 500 mg by mouth daily.   Past Week   OVER THE COUNTER MEDICATION Take 650 mg by mouth daily. Total Beets supplement   Past Week    Current Facility-Administered Medications  Medication Dose Route Frequency Provider Last Rate Last Admin   0.9 %  sodium chloride  infusion  250 mL Intravenous PRN [name], MD 10 mL/hr at 05/26/24 2028 Restarted at 05/26/24 2028   acetaminophen  (TYLENOL ) tablet 500 mg  500 mg Oral TID WC & HS [name], MD   500 mg at 06/02/24 2130   alum & mag hydroxide-simeth (MAALOX/MYLANTA) 200-200-20 MG/5ML suspension 30 mL  30 mL Oral Q6H PRN [name], MD   30 mL at 05/13/24 1326   apixaban  (ELIQUIS ) tablet 2.5 mg  2.5 mg Oral BID [name], MD   2.5 mg at 06/02/24 2130   cyanocobalamin  (VITAMIN B12) tablet 1,000 mcg  1,000 mcg Oral Daily [name], MD   1,000 mcg at  06/02/24 1037   cyclobenzaprine  (FLEXERIL ) tablet 5-10 mg  5-10 mg Oral TID PRN [name], MD   5 mg at 05/28/24 2124   DAPTOmycin  (CUBICIN ) IVPB 500 mg/11mL premix  8 mg/kg Intravenous Q1400 Dennise Kingsley, MD 100 mL/hr at 06/02/24 1434 500 mg at 06/02/24 1434   diphenhydrAMINE  (BENADRYL ) 12.5 MG/5ML elixir 6.25 mg  6.25 mg Oral Q6H PRN [name], MD   6.25 mg at  05/27/24 2115   diphenoxylate -atropine  (LOMOTIL ) 2.5-0.025 MG per tablet 2 tablet  2 tablet Oral QID PRN [name], MD       feeding supplement (BOOST / RESOURCE BREEZE) liquid 1 Container  1 Container Oral TID BM [name], MD       fentaNYL  (SUBLIMAZE ) injection 25 mcg  25 mcg Intravenous Q4H PRN [name], MD   25 mcg at 05/21/24 9356   hydrALAZINE  (APRESOLINE ) injection 5 mg  5 mg Intravenous Q6H PRN [name], MD       lip balm (CARMEX) ointment   Topical PRN [name], MD   Given at 05/20/24 1051   liver oil-zinc  oxide (DESITIN) 40 % ointment   Topical BID [name], MD   1 Application at 06/02/24 2130   loperamide  (IMODIUM ) capsule 4 mg  4 mg Oral TID WC & HS [name], MD   4 mg at 06/02/24 2129   megestrol  (MEGACE ) tablet 40 mg  40 mg Oral Daily [name], MD   40 mg at 06/01/24 1139   menthol -cetylpyridinium (CEPACOL) lozenge 3 mg  1 lozenge Oral PRN [name], MD       metoprolol  succinate (TOPROL -XL) 24 hr tablet 12.5 mg  12.5 mg Oral Daily [name], MD   12.5 mg at 06/02/24 1036   micafungin  (MYCAMINE ) 100 mg in sodium chloride  0.9 % 100 mL IVPB  100 mg Intravenous Q24H [name], MD 105 mL/hr at 06/02/24 1209 100 mg at 06/02/24 1209   multivitamin with minerals tablet 1 tablet  1 tablet Oral Daily Seabron Ronal HERO, [facility]   1 tablet at 06/01/24 9045   naphazoline-glycerin  (CLEAR EYES REDNESS) ophth solution 1-2 drop  1-2 drop Both Eyes QID PRN [name], MD       nystatin  (MYCOSTATIN /NYSTOP ) topical powder   Topical BID [name], MD   Given at 06/02/24 2135    ondansetron  (ZOFRAN ) injection 4 mg  4 mg Intravenous Q6H PRN [name], MD   4 mg at 05/16/24 9573   Oral care mouth rinse  15 mL Mouth Rinse PRN [name], MD       pantoprazole  (PROTONIX ) EC tablet 40 mg  40 mg Oral Daily [name], MD   40 mg at 06/02/24 1036   phenol (CHLORASEPTIC) mouth spray 2 spray  2 spray Mouth/Throat PRN [name], MD       piperacillin -tazobactam (ZOSYN ) IVPB 3.375 g  3.375 g Intravenous Q8H Meriel Kelliher, MD 12.5 mL/hr at 06/03/24 0515 3.375 g at 06/03/24 0515   polycarbophil (FIBERCON) tablet 625 mg  625 mg Oral BID [name], MD   625 mg at 06/02/24 2129   prochlorperazine  (COMPAZINE ) injection 5-10 mg  5-10 mg Intravenous Q6H PRN [name], MD   10 mg at 05/12/24 0155   simethicone  (MYLICON) 40 MG/0.6ML suspension 80 mg  80 mg Oral QID PRN [name], MD   80 mg at 05/15/24 2355   sodium chloride  (OCEAN) 0.65 % nasal spray 1-2 spray  1-2 spray Each Nare Q6H PRN [name], MD       sodium chloride  flush (NS) 0.9 % injection 10-40 mL  10-40 mL Intracatheter LENNETTE [name], MD   10 mL at 06/02/24 2135   sodium chloride  flush (NS) 0.9 % injection 10-40 mL  10-40 mL Intracatheter PRN [name], MD   10 mL at 05/26/24 1751   sodium chloride  flush (NS) 0.9 % injection 3 mL  3 mL Intravenous Q12H [name],  MD   3 mL at 06/02/24 2134   sodium chloride  flush (NS) 0.9 % injection 3 mL  3 mL Intravenous PRN [name], MD       sodium chloride  flush (NS) 0.9 % injection 5 mL  5 mL Intracatheter Q8H [name] LABOR, MD   5 mL at 06/03/24 0515   sodium chloride  flush (NS) 0.9 % injection 5 mL  5 mL Intracatheter Q8H [name] LABOR, MD   5 mL at 06/03/24 0515   tamsulosin  (FLOMAX ) capsule 0.4 mg  0.4 mg Oral QPC supper [name], MD   0.4 mg at 06/01/24 1716   TPN ADULT (ION)   Intravenous Continuous TPN Britta Eva HERO, RPH 40 mL/hr at 06/02/24 1848 New Bag at 06/02/24 1848   traMADol  (ULTRAM ) tablet 50-100 mg  50-100 mg  Oral Q6H PRN [name], MD   100 mg at 06/01/24 1432   witch hazel-glycerin  (TUCKS) pad   Topical PRN [name], MD         Allergies  Allergen Reactions   Elemental Sulfur Anaphylaxis    BP (!) 110/53 (BP Location: Left Arm)   Pulse 80   Temp 97.9 F (36.6 C) (Oral)   Resp 15   Ht 5' 5 (1.651 m)   Wt 61 kg   SpO2 97%   BMI 22.38 kg/m   Labs: Results for orders placed or performed during the hospital encounter of 05/08/24 (from the past 48 hours)  Prealbumin     Status: Abnormal   Collection Time: 06/02/24  2:55 AM  Result Value Ref Range   Prealbumin 11 (L) 18 - 38 mg/dL    Comment: Performed at [facility], [address], [place], [place] [address]  Comprehensive metabolic panel     Status: Abnormal   Collection Time: 06/02/24  2:55 AM  Result Value Ref Range   Sodium 132 (L) 135 - 145 mmol/L   Potassium 3.9 3.5 - 5.1 mmol/L   Chloride 101 98 - 111 mmol/L   CO2 23 22 - 32 mmol/L   Glucose, Bld 105 (H) 70 - 99 mg/dL    Comment: Glucose reference range applies only to samples taken after fasting for at least 8 hours.   BUN 18 8 - 23 mg/dL   Creatinine, Ser 9.28 0.44 - 1.00 mg/dL   Calcium  8.1 (L) 8.9 - 10.3 mg/dL   Total Protein 7.1 6.5 - 8.1 g/dL   Albumin  1.6 (L) 3.5 - 5.0 g/dL   AST 25 15 - 41 U/L   ALT 26 0 - 44 U/L   Alkaline Phosphatase 183 (H) 38 - 126 U/L   Total Bilirubin 0.9 0.0 - 1.2 mg/dL   GFR, Estimated >39 >39 mL/min    Comment: (NOTE) Calculated using the CKD-EPI Creatinine Equation (2021)    Anion gap 8 5 - 15    Comment: Performed at [facility], [address], [place], [place] [address]  Magnesium      Status: None   Collection Time: 06/02/24  2:55 AM  Result Value Ref Range   Magnesium  1.8 1.7 - 2.4 mg/dL    Comment: Performed at [facility], [address] [place], [place] [address]  Phosphorus     Status: None   Collection Time: 06/02/24  2:55 AM  Result Value Ref Range    Phosphorus 3.3 2.5 - 4.6 mg/dL    Comment: Performed at [facility], [address] [place],  [place] [address]  Triglycerides     Status: None   Collection Time: 06/02/24  2:55 AM  Result Value Ref Range   Triglycerides 137 <150 mg/dL    Comment: Performed at [facility], [address] [place], [place] [address]  CBC     Status: Abnormal   Collection Time: 06/02/24  2:55 AM  Result Value Ref Range   WBC 9.0 4.0 - 10.5 K/uL   RBC 3.23 (L) 3.87 - 5.11 MIL/uL   Hemoglobin 9.0 (L) 12.0 - 15.0 g/dL   HCT 70.1 (L) 63.9 - 53.9 %   MCV 92.3 80.0 - 100.0 fL   MCH 27.9 26.0 - 34.0 pg   MCHC 30.2 30.0 - 36.0 g/dL   RDW 83.7 (H) 88.4 - 84.4 %   Platelets 424 (H) 150 - 400 K/uL   nRBC 0.0 0.0 - 0.2 %    Comment: Performed at [facility], [address] [place], [place] [address]  Prealbumin     Status: Abnormal   Collection Time: 06/02/24  4:52 PM  Result Value Ref Range   Prealbumin 9 (L) 18 - 38 mg/dL    Comment: Performed at [facility], [address], [place], [place] [address]  Surgical pcr screen     Status: Abnormal   Collection Time: 06/02/24  7:55 PM   Specimen: Nasal Mucosa; Nasal Swab  Result Value Ref Range   MRSA, PCR POSITIVE (A) NEGATIVE   Staphylococcus aureus POSITIVE (A) NEGATIVE    Comment: (NOTE) The Xpert SA Assay (FDA approved for NASAL specimens in patients 62 years of age and older), is one component of a comprehensive surveillance program. It is not intended to diagnose infection nor to guide or monitor treatment. Performed at [facility], [address] [place], [place] [address]     Imaging / Studies: CT ABDOMEN PELVIS W CONTRAST Result Date: 06/02/2024 CLINICAL DATA:  Postoperative abdominal pain, pelvic abscess, anastomotic leak. Status post colostomy takedown EXAM: CT ABDOMEN AND PELVIS WITH CONTRAST TECHNIQUE: Multidetector CT imaging of the abdomen and pelvis was  performed using the standard protocol following bolus administration of intravenous contrast. RADIATION DOSE REDUCTION: This exam was performed according to the departmental dose-optimization program which includes automated exposure control, adjustment of the mA and/or kV according to patient size and/or use of iterative reconstruction technique. CONTRAST:  OMNIPAQUE  IOHEXOL  300 MG/ML  SOLN COMPARISON:  05/26/2024 FINDINGS: Lower chest: Small bilateral pleural effusions are present with associated bibasilar compressive atelectasis. Mild cardiomegaly. Hepatobiliary: No focal liver abnormality is seen. No gallstones, gallbladder wall thickening, or biliary dilatation. Pancreas: Unremarkable Spleen: Unremarkable Adrenals/Urinary Tract: The adrenal glands are unremarkable. The kidneys are normal in size and position. Stable minimally complex 7.2 cm right lower pole renal cyst demonstrating a single thin partially calcified septum most in keeping with a Bosniak class 2 cyst. No follow-up imaging is recommended. Punctate nonobstructing renal calculi are seen kidneys bilaterally measuring up to 2 mm. No hydronephrosis. Gas is again seen non dependently within bladder lumen, nonspecific, possibly related to recent catheterization. There is asymmetric bladder wall thickening involving the bladder dome anteriorly which may relate to the adjacent inflammatory process. Stomach/Bowel: Colorectal anastomoses appears widely dehiscent. Interval percutaneous drainage catheter has been placed into the rectal vault via a left transgluteal route. Moderate debris persists surrounding the drainage catheter raising the question of catheter occlusion. The dehiscent cavity appears to communicate with small bowel within the right lower quadrant (61/2.  Additionally, the terminal sigmoid colon demonstrates several fistula, 1 of which communicates with a developing gas collection within the anterior pelvis (69/2) this gas collection  appears to be within the area of the previously noted drainage catheter on 05/26/2024 and measures roughly 5.8 x 5.2 x 10.7 cm in dimension (67/2, 19/5). New percutaneous drainage catheter is seen within the colonic mesentery with evacuation of the previously noted loculated fluid collection in this region. Several small adjacent loculated fluid collections are seen within the small bowel mesentery measuring to 21 mm and 8 mm (55/2) which do not clearly communicate with this drainage catheter. Additional 11 mm fluid collection within the right mid abdominal mesentery (50/2), and right lower quadrant mesentery measuring 2.6 cm (62/2). There is extensive infiltration of the mesentery in keeping with changes of peritonitis. No evidence of obstruction. No free intraperitoneal gas or fluid. Vascular/Lymphatic: Moderate aortoiliac atherosclerotic calcification. Inferior vena cava filter noted within the infrarenal cava. The abdominal vasculature is otherwise unremarkable. In particular, the superior and inferior mesenteric veins and portal vein are patent. Reproductive: Status post hysterectomy. No adnexal masses. Other: No abdominal wall hernia. Moderate subcutaneous edema within the flanks bilaterally. Musculoskeletal: No acute bone abnormality. Osseous structures are age-appropriate IMPRESSION: 1. Interval percutaneous drainage catheter placement within the rectal vault via a left transgluteal route. Moderate debris persists surrounding the drainage catheter raising the question of catheter occlusion. 2. Status post fall rectal anastomosis with dehiscence of the anastomosis. The dehiscent cavity appears to communicate with small bowel within the right lower quadrant. Additionally, the terminal sigmoid colon demonstrates several fistulae, 1 of which communicates with a developing non dependent gas collection within the anterior pelvis measuring roughly 5.8 x 5.2 x 10.7 cm in dimension. 3. New percutaneous drainage  catheter within the colonic mesentery with evacuation of the previously noted loculated fluid collection in this region. Multiple additional small loculated fluid collections are seen within the small bowel mesentery measuring up to 21 mm are Jason 2 but do not communicate with this collection. 4. Additional small loculated fluid collections within the small bowel mesentery, as described above, without clear percutaneous route for access. 5. Extensive infiltration of the mesentery in keeping with changes of peritonitis. 6. Anasarca with small bilateral pleural effusions and subcutaneous body wall edema. Aortic Atherosclerosis (ICD10-I70.0). Electronically Signed   By: Dorethia Molt M.D.   On: 06/02/2024 20:18   CT IMAGE GUIDE DRAIN TRANSVAG TRANSRECT PERITONEAL RETROPER Result Date: 05/28/2024 INDICATION: Numerous small abdominal and pelvic abscesses in a postop patient. Previously 2 drains were placed. One drain demonstrates resolution of the abscess. The other drain has been accidentally removed. The patient needs a new drain placed in the pelvic region where the previous string was accidentally removed and a new drain placed and a maturing midline pelvic abscess. The indwelling catheter will be removed. EXAM: CT-guided drain placement x2 and removal of indwelling drainage catheter TECHNIQUE: Multidetector CT imaging of the pelvis was performed following the standard protocol with/without IV contrast. RADIATION DOSE REDUCTION: This exam was performed according to the departmental dose-optimization program which includes automated exposure control, adjustment of the mA and/or kV according to patient size and/or use of iterative reconstruction technique. MEDICATIONS: The patient is currently admitted to the hospital and receiving intravenous antibiotics. The antibiotics were administered within an appropriate time frame prior to the initiation of the procedure. ANESTHESIA/SEDATION: Moderate (conscious) sedation  was employed during this procedure. A total of Versed  2 mg and Fentanyl  100 mcg was administered intravenously by  the radiology nurse. Total intra-service moderate Sedation Time: 47 minutes. The patient's level of consciousness and vital signs were monitored continuously by radiology nursing throughout the procedure under my direct supervision. COMPLICATIONS: None immediate. PROCEDURE: Informed written consent was obtained from the patient after a thorough discussion of the procedural risks, benefits and alternatives. All questions were addressed. Maximal Sterile Barrier Technique was utilized including caps, mask, sterile gowns, sterile gloves, sterile drape, hand hygiene and skin antiseptic. A timeout was performed prior to the initiation of the procedure. With patient in a supine position on the CT gantry, radiopaque markers were placed on patient's skin. Patient is status and marked, prepped, and draped in usual sterile fashion. A 7 cm Yueh needle was then advanced through a small incision after local anesthesia was achieved with 1% lidocaine . The midline pelvic abscess was then accessed with a Yueh needle as demonstrated with follow-up CT imaging. The needle was removed and a short Amplatz wire was then advanced through the Yueh cannula. The Lafonda was removed and the access site was dilated using a 10 [place] dilator. A 10 French pigtail drainage catheter was then advanced over the guidewire coiled in the pelvic abscess. Retention sutures sterile dressing applied. The indwelling midline drainage catheter was then removed by cutting the retention suture and then cutting the catheter and discarded. Slow gentle traction was then applied to the antral portion of the catheter into the catheter was completely removed. Sterile dressing was applied. The patient was then turned to a prone position and radiopaque markers were placed on the patient's skin. Initial axial imaging was obtained in this position. The patient was  scanned was then marked, prepped, and draped in usual sterile fashion. Local anesthesia was achieved with 1% lidocaine . A 10 cm Yueh needle was then advanced into the perirectal abscess. Needle position was verified with serial CT imaging until the abscess was accessed. The needle was then removed leaving the cannula. A short Amplatz wire was then advanced and coiled within the abscess. The UB cannula was then retrieved and the access site was dilated using a 10 French fascial dilator. A 10 French drainage catheter was then advanced over the guidewire coiled within the abscess. Retention suture sterile dressings were applied. Both drainage catheters were connected to a JP bulb. Orders for flushing and care of the drainage catheters placed in the patient's chart. IMPRESSION: Satisfactory removal of the midline anterior abscess drainage catheter and placement of a new midline anterior pelvic drainage catheter as well as a posterior transgluteal drainage catheter in pelvic abscesses. Electronically Signed   By: Cordella Banner   On: 05/28/2024 10:11   CT GUIDED PERITONEAL/RETROPERITONEAL FLUID DRAIN BY PERC CATH Result Date: 05/28/2024 INDICATION: Numerous small abdominal and pelvic abscesses in a postop patient. Previously 2 drains were placed. One drain demonstrates resolution of the abscess. The other drain has been accidentally removed. The patient needs a new drain placed in the pelvic region where the previous string was accidentally removed and a new drain placed and a maturing midline pelvic abscess. The indwelling catheter will be removed. EXAM: CT-guided drain placement x2 and removal of indwelling drainage catheter TECHNIQUE: Multidetector CT imaging of the pelvis was performed following the standard protocol with/without IV contrast. RADIATION DOSE REDUCTION: This exam was performed according to the departmental dose-optimization program which includes automated exposure control, adjustment of the mA  and/or kV according to patient size and/or use of iterative reconstruction technique. MEDICATIONS: The patient is currently admitted to the hospital and  receiving intravenous antibiotics. The antibiotics were administered within an appropriate time frame prior to the initiation of the procedure. ANESTHESIA/SEDATION: Moderate (conscious) sedation was employed during this procedure. A total of Versed  2 mg and Fentanyl  100 mcg was administered intravenously by the radiology nurse. Total intra-service moderate Sedation Time: 47 minutes. The patient's level of consciousness and vital signs were monitored continuously by radiology nursing throughout the procedure under my direct supervision. COMPLICATIONS: None immediate. PROCEDURE: Informed written consent was obtained from the patient after a thorough discussion of the procedural risks, benefits and alternatives. All questions were addressed. Maximal Sterile Barrier Technique was utilized including caps, mask, sterile gowns, sterile gloves, sterile drape, hand hygiene and skin antiseptic. A timeout was performed prior to the initiation of the procedure. With patient in a supine position on the CT gantry, radiopaque markers were placed on patient's skin. Patient is status and marked, prepped, and draped in usual sterile fashion. A 7 cm Yueh needle was then advanced through a small incision after local anesthesia was achieved with 1% lidocaine . The midline pelvic abscess was then accessed with a Yueh needle as demonstrated with follow-up CT imaging. The needle was removed and a short Amplatz wire was then advanced through the Yueh cannula. The Lafonda was removed and the access site was dilated using a 10 [place] dilator. A 10 French pigtail drainage catheter was then advanced over the guidewire coiled in the pelvic abscess. Retention sutures sterile dressing applied. The indwelling midline drainage catheter was then removed by cutting the retention suture and then cutting  the catheter and discarded. Slow gentle traction was then applied to the antral portion of the catheter into the catheter was completely removed. Sterile dressing was applied. The patient was then turned to a prone position and radiopaque markers were placed on the patient's skin. Initial axial imaging was obtained in this position. The patient was scanned was then marked, prepped, and draped in usual sterile fashion. Local anesthesia was achieved with 1% lidocaine . A 10 cm Yueh needle was then advanced into the perirectal abscess. Needle position was verified with serial CT imaging until the abscess was accessed. The needle was then removed leaving the cannula. A short Amplatz wire was then advanced and coiled within the abscess. The UB cannula was then retrieved and the access site was dilated using a 10 French fascial dilator. A 10 French drainage catheter was then advanced over the guidewire coiled within the abscess. Retention suture sterile dressings were applied. Both drainage catheters were connected to a JP bulb. Orders for flushing and care of the drainage catheters placed in the patient's chart. IMPRESSION: Satisfactory removal of the midline anterior abscess drainage catheter and placement of a new midline anterior pelvic drainage catheter as well as a posterior transgluteal drainage catheter in pelvic abscesses. Electronically Signed   By: Cordella Banner   On: 05/28/2024 10:11   CT ABDOMEN PELVIS W CONTRAST Result Date: 05/26/2024 CLINICAL DATA:  Abdominal pain, postop EXAM: CT ABDOMEN AND PELVIS WITH CONTRAST TECHNIQUE: Multidetector CT imaging of the abdomen and pelvis was performed using the standard protocol following bolus administration of intravenous contrast. RADIATION DOSE REDUCTION: This exam was performed according to the departmental dose-optimization program which includes automated exposure control, adjustment of the mA and/or kV according to patient size and/or use of iterative  reconstruction technique. CONTRAST:  80mL OMNIPAQUE  IOHEXOL  300 MG/ML  SOLN COMPARISON:  None Available. FINDINGS: Lower chest: Small bilateral pleural effusions. Compressive atelectasis in the lower lobes. Hepatobiliary: No focal  hepatic abnormality. Gallbladder unremarkable. Pancreas: No focal abnormality or ductal dilatation. Spleen: No focal abnormality.  Normal size. Adrenals/Urinary Tract: Adrenal glands normal. Punctate left lower pole nephrolithiasis. No hydronephrosis. Benign appearing right lower pole cyst measures 6 cm. No follow-up imaging recommended. Stomach/Bowel: Postsurgical changes in the rectum. Again noted are probable frank dehiscence changes. Air again noted around the rectum in the perirectal space. Multiple small fluid collections and air collections in the abdomen and pelvis. The largest is noted anteriorly measuring 4 x 2.7 cm on image 47. This is just deep to and superior to and indwelling pigtail drainage catheter. Multiple other smaller fluid collections noted along the descending colon and sigmoid colon in the left lower quadrant and along small bowel loops in the right lower quadrant. No bowel obstruction. Vascular/Lymphatic: Aortic atherosclerosis. No evidence of aneurysm or adenopathy. IVC filter in place. Reproductive: Prior hysterectomy.  No adnexal masses. Other: Trace free fluid in the pelvis. A few locules of extraluminal gas scattered throughout the abdomen and pelvis within the fluid collections described above. Musculoskeletal: No acute bony abnormality. IMPRESSION: Similar appearance at the rectal anastomosis concerning for dehiscence. Interval placement of pigtail drainage catheter anteriorly in the pelvis. There are several small fluid collections throughout the abdomen and pelvis with largest anteriorly in the midline of the pelvis measuring 4 x 2.7 cm. Small bilateral pleural effusions. Compressive atelectasis in the lower lobes. Aortic atherosclerosis. Electronically  Signed   By: Franky Crease M.D.   On: 05/26/2024 14:44   CT GUIDED PERITONEAL/RETROPERITONEAL FLUID DRAIN BY PERC CATH Result Date: 05/21/2024 INDICATION: Multiple pelvic and abdominal postoperative abscesses. Planned placement of percutaneous drainage catheters in the 2 largest collections. EXAM: CT-guided drain placement x2 TECHNIQUE: Multidetector CT imaging of the pelvis was performed following the standard protocol without IV contrast. RADIATION DOSE REDUCTION: This exam was performed according to the departmental dose-optimization program which includes automated exposure control, adjustment of the mA and/or kV according to patient size and/or use of iterative reconstruction technique. MEDICATIONS: The patient is currently admitted to the hospital and receiving intravenous antibiotics. The antibiotics were administered within an appropriate time frame prior to the initiation of the procedure. ANESTHESIA/SEDATION: Moderate (conscious) sedation was employed during this procedure. A total of Versed  1.5 mg and Fentanyl  75 mcg was administered intravenously by the radiology nurse. Total intra-service moderate Sedation Time: 58 minutes. The patient's level of consciousness and vital signs were monitored continuously by radiology nursing throughout the procedure under my direct supervision. COMPLICATIONS: None immediate. PROCEDURE: Informed written consent was obtained from the patient after a thorough discussion of the procedural risks, benefits and alternatives. All questions were addressed. Maximal Sterile Barrier Technique was utilized including caps, mask, sterile gowns, sterile gloves, sterile drape, hand hygiene and skin antiseptic. A timeout was performed prior to the initiation of the procedure. With the patient in a supine position, radiopaque markers were placed near the umbilicus and initial images of the pelvis were obtained. The patient's skin was then marked, prepped, and draped in the usual sterile  fashion. Local anesthesia was achieved by infiltrating subcutaneous tissue with 1% lidocaine . A small incision was made in the right lower quadrant and a 7 cm Yueh needle was advanced and repeat imaging demonstrated the needle to be within the abscess. The needle was withdrawn leaving the cannula in position for an Amplatz wire to be advanced into the collection. The access site was then dilated using a 10 [place] dilator. A 10 French drainage catheter was then advanced over  the guidewire and placed in the collection. Retention suture, sterile dressing, and JP bulb for applied. The patient was then rotated to a prone position with radiopaque markers placed on the gluteal region. Repeat imaging was then used to try angulate location. The patient's skin was prepped and draped in usual sterile fashion. Local infiltration with 1% lidocaine  was used to anesthetize the region. A small incision was then made and again a 7 cm Yueh needle was advanced from the skin into the collection. The needle was withdrawn and an Amplatz wire was advanced through the cannula. Repeat imaging demonstrated satisfactory position. The access site was dilated using a 10 French fascial dilator. A 10 French drainage catheter was then advanced over the guidewire and locked in position. Retention suture and sterile dressing applied. The catheter was connected to a JP bulb. IMPRESSION: Satisfactory placement of 210 French drainage catheters and the largest pelvic abscess collections in this patient. Ventral near the umbilicus and transgluteal. Electronically Signed   By: Cordella Banner   On: 05/21/2024 10:00   CT ABDOMEN PELVIS W CONTRAST Result Date: 05/19/2024 CLINICAL DATA:  History of complicated diverticulitis with perforation status post colostomy takedown with abdominal distension EXAM: CT ABDOMEN AND PELVIS WITH CONTRAST TECHNIQUE: Multidetector CT imaging of the abdomen and pelvis was performed using the standard protocol following  bolus administration of intravenous contrast. RADIATION DOSE REDUCTION: This exam was performed according to the departmental dose-optimization program which includes automated exposure control, adjustment of the mA and/or kV according to patient size and/or use of iterative reconstruction technique. CONTRAST:  OMNIPAQUE  IOHEXOL  300 MG/ML  SOLN COMPARISON:  CT abdomen and pelvis dated 10/19/2023 FINDINGS: Lower chest: Bilateral lower lobe relaxation atelectasis adjacent to small bilateral pleural effusions. No pneumothorax. Partially imaged heart size is normal. Hepatobiliary: No focal hepatic lesions. No intra or extrahepatic biliary ductal dilation. Gallbladder is contracted. 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 No main pancreatic ductal dilation. Spleen: Normal in size without focal abnormality. Adrenals/Urinary Tract: No adrenal nodules. No hydronephrosis or suspicious renal masses. Punctate nonobstructing left lower pole renal stone. Exophytic lower pole right renal cyst demonstrates peripheral calcification. No specific follow-up imaging recommended. Underdistended urinary bladder contains small volume intraluminal gas and catheter in-situ. Stomach/Bowel: Normal appearance of the stomach. Postsurgical changes of prior low anterior section with colostomy takedown and small bowel anastomosis. Frank dehiscence along the rectal anastomosis extending from 1-3 o'clock (2:76) with moderate volume free air and intraluminal contents extending into the perirectal space and extending superiorly along the presacral space. Fluid and gas tracks to the anterior lower abdomen (2:73), where there is a moderately sized pocket of gas and fluid measuring 10.3 x 6.7 cm (2:72). This collection is also contiguous along the lateral right lower quadrant/pelvic sidewall (2:72). Multiple additional irregular fluid collections insinuate  between loops of bowel, for example 3.9 x 2.0 cm right lower quadrant (2:64) and surrounding the descending/proximal sigmoid colon in the left lower quadrant measuring 5.0 x 2.2 cm (2:58). Vascular/Lymphatic: Aortic atherosclerosis. IVC filter in-situ. No enlarged abdominal or pelvic lymph nodes. Reproductive: No adnexal masses. Other: Free air and fluid collections as described. Musculoskeletal: No acute or abnormal lytic or blastic osseous lesions. Small foci of subcutaneous emphysema within the right anterior abdominal wall. Mild body wall edema. Postsurgical changes of the anterior abdominal wall. IMPRESSION: 1. Postsurgical changes of prior low anterior section with colostomy takedown and small bowel anastomosis. Frank dehiscence along the  rectal anastomosis extending from 1-3 o'clock with moderate volume free air and intraluminal contents extending into the perirectal space and extending superiorly along the presacral space. Multiple irregular fluid collections as described. 2. Small bilateral pleural effusions with adjacent relaxation atelectasis. 3.  Aortic Atherosclerosis (ICD10-I70.0). Critical Value/emergent results were called by telephone at the time of interpretation on 05/19/2024 at 1:15 pm to provider [facility] , who verbally acknowledged these results. Electronically Signed   By: Limin  Xu M.D.   On: 05/19/2024 13:45   DG CHEST PORT 1 VIEW Result Date: 05/12/2024 CLINICAL DATA:  [id number] S/P PICC central line placement [id number] EXAM: PORTABLE CHEST - 1 VIEW COMPARISON:  May 11, 2024 FINDINGS: Right PICC terminates lower SVC. Esophagogastric tube extends below the level of the diaphragms with the distal tip outside the field of view. However, the last side hole appears in the region of the stomach. No focal airspace consolidation, pleural effusion, or pneumothorax. No cardiomegaly. IMPRESSION: 1. Right PICC terminates lower SVC. 2. Esophagogastric tube extends below the level of the diaphragm with the  distal tip outside the field of view. However, the last sidehole appears in the region of the stomach. Electronically Signed   By: Rogelia Myers M.D.   On: 05/12/2024 11:59   DG Abd 1 View Result Date: 05/12/2024 CLINICAL DATA:  NG tube placement EXAM: ABDOMEN - 1 VIEW COMPARISON:  October 26, 2023 FINDINGS: the tip of the nasogastric tube is located within the antrum stomach in good position. IVC filter in place IMPRESSION: Nasogastric tube in good position. Electronically Signed   By: Franky Chard M.D.   On: 05/12/2024 08:53   US  EKG SITE RITE Result Date: 05/12/2024 If Site Rite image not attached, placement could not be confirmed due to current cardiac rhythm.  DG CHEST PORT 1 VIEW Result Date: 05/11/2024 EXAM: 1 VIEW XRAY OF THE CHEST 05/11/2024 06:11:00 PM COMPARISON: 05/09/2024 CLINICAL HISTORY: Dyspnea, had bleeding from rectum earlier today and became unresponsive. FINDINGS: LUNGS AND PLEURA: No focal pulmonary opacity. No pulmonary edema. No pleural effusion. No pneumothorax. HEART AND MEDIASTINUM: No acute abnormality of the cardiac and mediastinal silhouettes. BONES AND SOFT TISSUES: No acute osseous abnormality. IMPRESSION: 1. No acute process. Electronically signed by: Pinkie Pebbles MD 05/11/2024 07:06 PM EDT RP Workstation: [id number]   ECHOCARDIOGRAM COMPLETE Result Date: 05/10/2024    ECHOCARDIOGRAM REPORT   Patient Name:   Elizabeth Odom Date of Exam: 05/10/2024 Medical Rec #:  [id number]      Height:       65.0 in Accession #:    [id number]     Weight:       132.9 lb Date of Birth:  1941/08/27     BSA:          1.663 m Patient Age:    82 years       BP:           158/72 mmHg Patient Gender: F              HR:           95 bpm. Exam Location:  Inpatient Procedure: 2D Echo, Cardiac Doppler and Color Doppler (Both Spectral and Color            Flow Doppler were utilized during procedure). Indications:    I48.91* Unspeicified atrial fibrillation  History:        Patient has prior  history of Echocardiogram examinations, most  recent 10/01/2023. Abnormal ECG, Arrythmias:Atrial Fibrillation,                 Signs/Symptoms:Altered Mental Status; Risk Factors:Hypertension                 and Dyslipidemia. Polmonary embolus. IVC filter.  Sonographer:    Ellouise Mose RDCS Referring Phys: [phone] ANAND D HONGALGI IMPRESSIONS  1. Left ventricular ejection fraction, by estimation, is 60 to 65%. The left ventricle has normal function. The left ventricle has no regional wall motion abnormalities. Left ventricular diastolic parameters are indeterminate.  2. Right ventricular systolic function is normal. The right ventricular size is normal. There is normal pulmonary artery systolic pressure.  3. The mitral valve was not well visualized. Mild mitral valve regurgitation. No evidence of mitral stenosis.  4. The tricuspid valve is abnormal. Tricuspid valve regurgitation is mild to moderate.  5. The aortic valve is tricuspid. Aortic valve regurgitation is not visualized. No aortic stenosis is present.  6. The inferior vena cava is normal in size with greater than 50% respiratory variability, suggesting right atrial pressure of 3 mmHg. FINDINGS  Left Ventricle: Left ventricular ejection fraction, by estimation, is 60 to 65%. The left ventricle has normal function. The left ventricle has no regional wall motion abnormalities. The left ventricular internal cavity size was normal in size. There is  no left ventricular hypertrophy. Left ventricular diastolic parameters are indeterminate. Right Ventricle: The right ventricular size is normal. No increase in right ventricular wall thickness. Right ventricular systolic function is normal. There is normal pulmonary artery systolic pressure. The tricuspid regurgitant velocity is 2.58 m/s, and  with an assumed right atrial pressure of 8 mmHg, the estimated right ventricular systolic pressure is 34.6 mmHg. Left Atrium: Left atrial size was normal in size. Right  Atrium: Right atrial size was normal in size. Pericardium: There is no evidence of pericardial effusion. Mitral Valve: The mitral valve was not well visualized. Mild mitral valve regurgitation. No evidence of mitral valve stenosis. Tricuspid Valve: The tricuspid valve is abnormal. Tricuspid valve regurgitation is mild to moderate. No evidence of tricuspid stenosis. Aortic Valve: The aortic valve is tricuspid. Aortic valve regurgitation is not visualized. No aortic stenosis is present. Aortic valve mean gradient measures 5.1 mmHg. Aortic valve peak gradient measures 11.4 mmHg. Aortic valve area, by VTI measures 2.37  cm. Pulmonic Valve: The pulmonic valve was normal in structure. Pulmonic valve regurgitation is not visualized. No evidence of pulmonic stenosis. Aorta: The aortic root and ascending aorta are structurally normal, with no evidence of dilitation. Venous: The inferior vena cava is normal in size with greater than 50% respiratory variability, suggesting right atrial pressure of 3 mmHg. IAS/Shunts: No atrial level shunt detected by color flow Doppler.  LEFT VENTRICLE PLAX 2D LVIDd:         3.60 cm LVIDs:         2.40 cm LV PW:         1.00 cm LV IVS:        1.00 cm LVOT diam:     2.30 cm LV SV:         57 LV SV Index:   34 LVOT Area:     4.15 cm  LV Volumes (MOD) LV vol d, MOD A2C: 61.0 ml LV vol d, MOD A4C: 49.1 ml LV vol s, MOD A2C: 27.6 ml LV vol s, MOD A4C: 21.0 ml LV SV MOD A2C:     33.4 ml LV SV MOD A4C:  49.1 ml LV SV MOD BP:      33.9 ml RIGHT VENTRICLE             IVC RV S prime:     18.00 cm/s  IVC diam: 2.10 cm TAPSE (M-mode): 1.3 cm LEFT ATRIUM           Index        RIGHT ATRIUM           Index LA diam:      2.00 cm 1.20 cm/m   RA Area:     14.60 cm LA Vol (A2C): 9.6 ml  5.79 ml/m   RA Volume:   30.80 ml  18.52 ml/m LA Vol (A4C): 17.6 ml 10.58 ml/m  AORTIC VALVE AV Area (Vmax):    2.58 cm AV Area (Vmean):   2.82 cm AV Area (VTI):     2.37 cm AV Vmax:           168.92 cm/s AV Vmean:           103.287 cm/s AV VTI:            0.240 m AV Peak Grad:      11.4 mmHg AV Mean Grad:      5.1 mmHg LVOT Vmax:         105.00 cm/s LVOT Vmean:        70.100 cm/s LVOT VTI:          0.137 m LVOT/AV VTI ratio: 0.57  AORTA Ao Root diam: 3.20 cm Ao Asc diam:  3.50 cm MITRAL VALVE               TRICUSPID VALVE MV Area (PHT): 3.65 cm    TR Peak grad:   26.6 mmHg MV Decel Time: 208 msec    TR Vmax:        258.00 cm/s MV E velocity: 85.20 cm/s                            SHUNTS                            Systemic VTI:  0.14 m                            Systemic Diam: 2.30 cm Dorn Ross MD Electronically signed by Dorn Ross MD Signature Date/Time: 05/10/2024/6:42:49 PM    Final    DG Chest Port 1 View Result Date: 05/09/2024 CLINICAL DATA:  AFib postop colon surgery EXAM: PORTABLE CHEST 1 VIEW COMPARISON:  09/20/2023 FINDINGS: No acute airspace disease or effusion. Cardiomediastinal silhouette within normal limits. Aortic atherosclerosis. Chest wall emphysema bilaterally. Small volume free air beneath the right diaphragm. IVC filter to the right of L3. IMPRESSION: 1. No active disease. 2. Small volume free air beneath the right diaphragm and bilateral chest wall emphysema, likely postsurgical. It Electronically Signed   By: Luke Bun M.D.   On: 05/09/2024 19:41   DG C-Arm 1-60 Min-No Report Result Date: 05/08/2024 Fluoroscopy was utilized by the requesting physician.  No radiographic interpretation.     Briant KYM Schultze, M.D., F.A.C.S. Gastrointestinal and Minimally Invasive Surgery [facility] [address] [place], [place] [address] [phone] Main / Paging  06/03/2024 7:08 AM    Elspeth JAYSON Schultze

## 2024-06-03 NOTE — Progress Notes (Signed)
 I updated the patient's status to the patient's granddaughter  I explained the patient's failure to rally despite supplemental nutrition drainage and antibiotics.  Need for fecal diversion.  I strongly suspect this will be permanent.  Granddaughter agrees.  She has been trying to get the patient to eat or drink without success.  Has noticed decline.  Agrees with proceeding with surgery.  Ostomy and probable feeding gastrostomy tube.  Recommendations were made.  Questions were answered.  She expressed understanding & appreciation.

## 2024-06-03 NOTE — Op Note (Signed)
 06/03/2024 3:46 PM  PATIENT:  Elizabeth Odom  83 y.o. female  Patient Care Team: Jesus Bernardino MATSU, MD as PCP - General (Internal Medicine) Elmira Newman PARAS, MD as PCP - Cardiology (Cardiology) [name], MD as Consulting Physician (Gastroenterology) Rubin Calamity, MD as Consulting Physician (General Surgery) [name], MD (Neurology) [name], MD as Consulting Physician (Urology) [name], MD as Consulting Physician (Cardiology) [name], MD as Consulting Physician (Colon and Rectal Surgery)  PRE-OPERATIVE DIAGNOSIS:   DELAYED COLORECTAL ANASTOMOTIC LEAK, NEED FOR FECAL DIVERSION  POST-OPERATIVE DIAGNOSIS:   DELAYED COLORECTAL ANASTOMOTIC LEAK, NEED FOR FECAL DIVERSION CHRONIC PELVIC ABSCESS  PROCEDURE:   DIAGNOSTIC LAPAROSCOPY CONVERTED TO LAPAROTOMY DIVERTING LOOP TRANSVERSE COLOSTOMY DRAINAGE OF ABDOMINAL ABSCESS GASTROSTOMY TUBE PLACEMENT RECTAL TUBE PLACEMENT  SURGEON:  [name], MD  ASSISTANT:  Odelia Flock, MD, [facility]  An experienced assistant was required given the standard of surgical care given the complexity of the case.  This assistant was needed for exposure, dissection, suction, tissue approximation, retraction, perception, etc  ANESTHESIA:  General endotracheal intubation anesthesia (GETA)  Estimated Blood Loss (EBL):   Total I/O In: 500 [IV Piggyback:500] Out: - .   (See anesthesia record)  Delay start of Pharmacological VTE agent (>24hrs) due to concerns of significant anemia, surgical blood loss, or risk of bleeding?:  no  DRAINS:  - (Prior abdominal and transgluteal drains removed) - New 19 French Blake drain from left upper quadrant runs along the left paracolic gutter down to the pelvis. - Left upper quadrant 22 French gastrostomy Mickey tube - 30 French Foley catheter acting as temporary rectal tube  SPECIMEN:  (no specimen)  DISPOSITION OF SPECIMEN:  (not applicable)  COUNTS:  Sponge,  needle, & instrument counts CORRECT at the conclusion of the case.      PLAN OF CARE: Admit to inpatient   PATIENT DISPOSITION:  ICU - intubated and hemodynamically stable.  INDICATION: 83 year old woman requiring Hartmann resection for diverticulitis with abscesses and small bowel resection last year.  Eventual recovery.  Desired colostomy takedown.  I did robotic lyse adhesions with resection of rectal stump stricture and anastomosis.  Struggled with postoperative afibrillation, bleeding, delayed abscesses, delayed anastomotic leak.  Initially refused surgery.  Placed on TPN with transgluteal intra-abdominal drainage.  Initially improved then pelvic drain came out.  Failure to thrive.  Discussed with granddaughter and convinced patient to go to surgery for diverting ostomy The anatomy & physiology of the digestive tract was discussed.  The pathophysiology of perforation was discussed.  Differential diagnosis such as perforated ulcer or colon, etc was discussed.   Natural history risks without surgery such as death was discussed.  I recommended abdominal exploration to diagnose & treat the source of the problem.  Laparoscopic & open techniques were discussed.   Risks such as bleeding, infection, abscess, leak, reoperation, bowel resection, possible ostomy, injury to other organs, need for repair of tissues / organs, hernia, heart attack, death, and other risks were discussed.   The risks of no intervention will lead to serious problems including death.   I expressed a good likelihood that surgery will address the problem.    Goals of post-operative recovery were discussed as well.  We will work to minimize complications although risks in an emergent setting are high.   Questions were answered.  The patient expressed understanding & wishes to proceed with surgery.        OR FINDINGS: Very dense concrete intraperitoneal and pelvic  adhesions infraumbilically.  Bladder, small bowel, omentum walling off  the lower pelvis with delayed anastomotic leak.  Near disruption of colorectal anastomosis by transanal examination.  Rectal tube placed through rectal stump to drain the pelvis more directly.  Diverting mid-transverse loop colostomy done.  Removal of old percutaneous abdominal and transgluteal drains.    CASE DATA:  Type of patient?: Elective [facility] Private Case  Status of Case? URGENT Add On  Infection Present At Time Of Surgery (PATOS)?  ABSCESS  DESCRIPTION:  Informed consent was confirmed.  The patient underwent general anaesthesia without difficulty.  The patient was positioned appropriately.  VTE prevention in place.  I removed the transabdominal and transgluteal drains.  The patient's abdomen was clipped, prepped, & draped in a sterile fashion.  Surgical timeout confirmed our plan.  Peritoneal entry with a laparoscopic port was obtained using Varess spring needle entry technique in the left upper abdomen as the patient was positioned in reverse Trendelenburg.  I induced carbon dioxide insufflation.  .  Full symmetrical abdominal distention.  Initial port was carefully placed.  Camera inspection noted small needle aspiration of left lateral sector of the liver with no major bleeding.  This point patient became very hypotensive.  Patient was desufflated.  Received albumin  volume and pressors.  ET tube adjusted.  Patient's pressure and saturations improved.  After 15 minutes of resuscitation, it was decided could proceed with laparotomy since patient needed diversion.  I therefore entered the abdomen through a midline incision.  Freed off dense adhesions to the anterior abdominal wall primarily and bluntly with some focused cautery.  The lower half the abdomen was densely fixed with concrete adhesions.  Work to try and free off some interloop adhesions.  Right lower quadrant was densely fixed so there is no pathway there.  I can get a window along the left lower quadrant and flank I can get  down into the pelvis.  The bladder dome was densely adherent and tacked down to the mid pelvis.  Was able to come laterally and get into the deeper pelvis.  I cannot definitely pump into any stool or undrained abscess.  We did irrigation.  Ensured hemostasis.  I felt it was not safe to do more aggressive dissection and decided to do more proximal fecal diversion.  The mid transverse colon was not particularly inflamed and was free.  I did not feel Bringle loop ileostomy was wise since she had already had 2 distal small bowel resections from her prior 2 surgeries.  I chose an area in the mid transverse colon that could reach into the right side at the premarked location.  I wish to avoid a new colostomy in the left side where she had a prior one anyway.  Made a 2 cm skin incision in the right paramedian supraumbilical premarked region and excised a short cylinder of subcutaneous tissue.  Anterior to his fascia transversely.  Came through the rectus muscle and posterior rectus fascia vertically.  Dilated 2 fingers.  Brought up the dilated transverse colon up and held out in place.  Oriented so that the proximal end was coming up superiorly.  I placed a drain from the left upper quadrant along the left paracolic gutter with the tip crossing down the pelvis and laying over the top of the frozen abdomen in the midline where the prior intra-abdominal abscess had been.  Because of her poor oral intake I decided to proceed with a feeding tube gastrostomy.  I confirmed that the nasogastric  tube was in the body of the stomach.  Blood pressure remained stable off pressors.  I chose a region in the left subcostal region and made a transverse 1 cm opening.  Passed a 79 [place] Mickey tube through the abdominal wall and held it away.  I chose a region on the stomach near the antrum.  We did a 2-0 PDS pursestring suture.  I then used 2-0 silk interrupted sutures in 4 quadrants (superior, laterally, medially, inferiorly on the  anterior abdominal wall and to the stomach.  I held those clamped.  We then opened the stomach and created gastrostomy.  Passed the Mickey tube into the opening and got it to pass.  Grew up the gastrostomy tube balloon with 10 mL of sterile water .  Then tied the silk sutures down to help tack the stomach to the anterior abdominal wall to have a good flush apposition between the stomach and anterior abdominal wall.  Area.  Tied the pursestring down as well.  I then secured the flange to the skin using 2-0 Prolene interrupted horizontal mattress loose type sutures with 1 suture wrapping around the tube as well to secure it externally as well.  Gastrostomy tube secured to gravity bag.  Reinspected the abdomen.  Saw no obvious bowel or bladder injury.  Hemostasis was adequate.  We closed the fascia using #1 PDS in a running fashion.  Freed up some of the dermis off the divot in midline scar.  Placed a gray sponge sliver along the midline since she was thin-walled and placed a wound VAC to good result.  We then matured the diverting colostomy.  Started with 2-0 Vicryl sutures x 4 corners for good dermal and colon wall apposition.  Opened the colon in a Genter transverse curve.  Then everted the mucosa with sutures to create a 2 cm Brooke loop colostomy.  I did finger intubation to confirm no kinking or twisting of the diverting colostomy.  Proximal and cephalad.  Appliance placed.  I did an anorectal examination under anesthesia confirmed that the colorectal anastomosis staple line was significantly disrupted with stool.  I carefully aspirated the stool out of the pelvis with a gentle tip sucker with irrigation to clear return.  I decided to pleat a 30 French Foley catheter in the rectal stump to have some transanal drainage at the start.  Most likely be removed in the next day or so.  Patient remained hemodynamically stable since the laparotomy.  Making urine.  Patient was sent to the intensive care unit intubated  given her significant hemodynamic compromise at the start of the case.  I called and discussed with Dr. Meade with pulmonary critical care.  Will watch her at least overnight and see if she is extubated will in the morning.  Hopefully can pull an NG tube and use gastrostomy tube for feedings.  Get rectal tube out in the next day or so.  I discussed operative findings, updated the patient's status, discussed probable steps to recovery, and gave postoperative recommendations to the patient's granddaughter, [name].  Recommendations were made.  Questions were answered.  She expressed understanding & appreciation.     Elspeth KYM Schultze, M.D., F.A.C.S. Gastrointestinal and Minimally Invasive Surgery [facility] [address] [place], [place] [address] [phone] Main / Paging

## 2024-06-03 NOTE — Progress Notes (Signed)
 PROGRESS NOTE    Elizabeth Odom  FMW:[id number] DOB: [birth year] DOA: 05/08/2024 PCP: Jesus Bernardino MATSU, MD   Brief Narrative: Elizabeth Odom is a 83 y.o. female with a history of atrial fibrillation, DVT, PE, anemia, PACs, hypertension, mild cognitive impairment, large bowel obstruction.  Patient initially presented for surgical management involving history of diverticulitis with planned ostomy takedown with anastomosis. During hospitalization, patient developed altered mental status secondary to polypharmacy in addition to atrial fibrillation with RVR. Cardiology consulted for recommendation and management with subsequent improvement in RVR. Ongoing care by general surgery.   Assessment and Plan:  Persistent atrial fibrillation/flutter Cardiology consulted for management.  Medication adjusted to metoprolol  XL 25 mg daily with recommendation for Eliquis  when okay per surgery. Toprol -XL decreased to 12.5 mg daily -Continue Toprol -XL 12.5 mg daily -Continue Eliquis   History of diverticulitis with perforation and abscess with colostomy status post ostomy takedown Ileus Per primary.  Infectious diseases consulted and are planning for management with multi week course of antibiotics.  Urine culture (6/12) significant for Pseudomonas, MRSA, Enterococcus faecalis, Candida albicans.  Drain culture (6/17) significant for rare Pseudomonas aeruginosa.  Ongoing antibiotics per infectious disease.  Patient now on daptomycin , micafungin , Zosyn . Plan for diverting colostomy.  Acute blood loss anemia Secondary to surgery.  Hemoglobin down to a low of 6.1 this admission.  Patient received a total of 4 units of PRBC via transfusion this admission.  Hemoglobin currently stable.  AKI Mild. Resolved.  Hypoalbuminemia Noted. Associated poor nutrition.  Leukocytosis In setting of surgery. Resolved.  Thrombocytosis Noted. Likely reactive. Mildly elevated and  stable.  Hyponatremia Mild.  Syncope Syncopal episode occurred during hospitalization while patient was on commode.  Concern for possible vasovagal versus hypotension etiology.  Transthoracic echo obtained with evidence of preserved LVEF of 60 to 65% in addition to no aortic stenosis.  Acute urinary retention Foley inserted on 6/4. Flomax  started. Foley removed on 6/13. -Continue Flomax   Acute metabolic encephalopathy Delirium Resolved.  Generalized weakness Physical therapy is ordered already.  Discussed with patient that is important for her to participate as much as possible with therapy to improve her strength.   DVT prophylaxis: Per primary Code Status:   Code Status: Full Code Family Communication: None at bedside Disposition Plan: Per primary   Consultants:    Procedures:    Antimicrobials:     Subjective: No events noted from overnight.  Objective: BP (!) 110/53 (BP Location: Left Arm)   Pulse 80   Temp 97.9 F (36.6 C) (Oral)   Resp 15   Ht 5' 5 (1.651 m)   Wt 61 kg   SpO2 97%   BMI 22.38 kg/m   Examination:  General exam: Appears calm and comfortable Respiratory system: Respiratory effort normal.    Data Reviewed: I have personally reviewed following labs and imaging studies  CBC Lab Results  Component Value Date   WBC 9.0 06/02/2024   RBC 3.23 (L) 06/02/2024   HGB 9.0 (L) 06/02/2024   HCT 29.8 (L) 06/02/2024   MCV 92.3 06/02/2024   MCH 27.9 06/02/2024   PLT 424 (H) 06/02/2024   MCHC 30.2 06/02/2024   RDW 16.2 (H) 06/02/2024   LYMPHSABS 0.7 05/09/2024   MONOABS 1.8 (H) 05/09/2024   EOSABS 0.0 05/09/2024   BASOSABS 0.0 05/09/2024     Last metabolic panel Lab Results  Component Value Date   NA 132 (L) 06/02/2024   K 3.9 06/02/2024   CL 101 06/02/2024   CO2 23 06/02/2024  BUN 18 06/02/2024   CREATININE 0.71 06/02/2024   GLUCOSE 105 (H) 06/02/2024   GFRNONAA >60 06/02/2024   GFRAA >60 04/10/2016   CALCIUM  8.1 (L)  06/02/2024   PHOS 3.3 06/02/2024   PROT 7.1 06/02/2024   ALBUMIN  1.6 (L) 06/02/2024   LABGLOB 2.9 04/08/2024   BILITOT 0.9 06/02/2024   ALKPHOS 183 (H) 06/02/2024   AST 25 06/02/2024   ALT 26 06/02/2024   ANIONGAP 8 06/02/2024    GFR: Estimated Creatinine Clearance: 47.9 mL/min (by C-G formula based on SCr of 0.71 mg/dL).  Recent Results (from the past 240 hours)  Aerobic/Anaerobic Culture w Gram Stain (surgical/deep wound)     Status: None   Collection Time: 05/27/24  5:19 PM   Specimen: Abscess  Result Value Ref Range Status   Specimen Description   Final    ABSCESS ABDOMEN Performed at [facility], [address] [place], [place] [address]    Special Requests   Final    NONE Performed at [facility], [address], [place], [place] [address]    Gram Stain   Final    ABUNDANT WBC PRESENT, PREDOMINANTLY PMN NO ORGANISMS SEEN    Culture   Final    RARE PSEUDOMONAS AERUGINOSA RARE CANDIDA ALBICANS NO ANAEROBES ISOLATED Performed at [facility], [address], [place], [place] [address]    Report Status 06/01/2024 FINAL  Final   Organism ID, Bacteria PSEUDOMONAS AERUGINOSA  Final      Susceptibility   Pseudomonas aeruginosa - MIC*    CEFTAZIDIME 4 SENSITIVE Sensitive     CIPROFLOXACIN <=0.25 SENSITIVE Sensitive     GENTAMICIN 4 SENSITIVE Sensitive     IMIPENEM 2 SENSITIVE Sensitive     PIP/TAZO <=4 SENSITIVE Sensitive ug/mL    CEFEPIME 2 SENSITIVE Sensitive     * RARE PSEUDOMONAS AERUGINOSA  Surgical pcr screen     Status: Abnormal   Collection Time: 06/02/24  7:55 PM   Specimen: Nasal Mucosa; Nasal Swab  Result Value Ref Range Status   MRSA, PCR POSITIVE (A) NEGATIVE Final   Staphylococcus aureus POSITIVE (A) NEGATIVE Final    Comment: (NOTE) The Xpert SA Assay (FDA approved for NASAL specimens in patients 8 years of age and older), is one component of a comprehensive surveillance program. It is not intended  to diagnose infection nor to guide or monitor treatment. Performed at [facility], [address] [place], [place] [address]       Radiology Studies: CT ABDOMEN PELVIS W CONTRAST Result Date: 06/02/2024 CLINICAL DATA:  Postoperative abdominal pain, pelvic abscess, anastomotic leak. Status post colostomy takedown EXAM: CT ABDOMEN AND PELVIS WITH CONTRAST TECHNIQUE: Multidetector CT imaging of the abdomen and pelvis was performed using the standard protocol following bolus administration of intravenous contrast. RADIATION DOSE REDUCTION: This exam was performed according to the departmental dose-optimization program which includes automated exposure control, adjustment of the mA and/or kV according to patient size and/or use of iterative reconstruction technique. CONTRAST:  OMNIPAQUE  IOHEXOL  300 MG/ML  SOLN COMPARISON:  05/26/2024 FINDINGS: Lower chest: Small bilateral pleural effusions are present with associated bibasilar compressive atelectasis. Mild cardiomegaly. Hepatobiliary: No focal liver abnormality is seen. No gallstones, gallbladder wall thickening, or biliary dilatation. Pancreas: Unremarkable Spleen: Unremarkable Adrenals/Urinary Tract: The adrenal glands are unremarkable. The kidneys are normal in size and position. Stable minimally complex 7.2 cm right lower pole renal cyst demonstrating a single thin partially calcified septum most in keeping  with a Bosniak class 2 cyst. No follow-up imaging is recommended. Punctate nonobstructing renal calculi are seen kidneys bilaterally measuring up to 2 mm. No hydronephrosis. Gas is again seen non dependently within bladder lumen, nonspecific, possibly related to recent catheterization. There is asymmetric bladder wall thickening involving the bladder dome anteriorly which may relate to the adjacent inflammatory process. Stomach/Bowel: Colorectal anastomoses appears widely dehiscent. Interval percutaneous drainage catheter has  been placed into the rectal vault via a left transgluteal route. Moderate debris persists surrounding the drainage catheter raising the question of catheter occlusion. The dehiscent cavity appears to communicate with small bowel within the right lower quadrant (61/2. Additionally, the terminal sigmoid colon demonstrates several fistula, 1 of which communicates with a developing gas collection within the anterior pelvis (69/2) this gas collection appears to be within the area of the previously noted drainage catheter on 05/26/2024 and measures roughly 5.8 x 5.2 x 10.7 cm in dimension (67/2, 19/5). New percutaneous drainage catheter is seen within the colonic mesentery with evacuation of the previously noted loculated fluid collection in this region. Several small adjacent loculated fluid collections are seen within the small bowel mesentery measuring to 21 mm and 8 mm (55/2) which do not clearly communicate with this drainage catheter. Additional 11 mm fluid collection within the right mid abdominal mesentery (50/2), and right lower quadrant mesentery measuring 2.6 cm (62/2). There is extensive infiltration of the mesentery in keeping with changes of peritonitis. No evidence of obstruction. No free intraperitoneal gas or fluid. Vascular/Lymphatic: Moderate aortoiliac atherosclerotic calcification. Inferior vena cava filter noted within the infrarenal cava. The abdominal vasculature is otherwise unremarkable. In particular, the superior and inferior mesenteric veins and portal vein are patent. Reproductive: Status post hysterectomy. No adnexal masses. Other: No abdominal wall hernia. Moderate subcutaneous edema within the flanks bilaterally. Musculoskeletal: No acute bone abnormality. Osseous structures are age-appropriate IMPRESSION: 1. Interval percutaneous drainage catheter placement within the rectal vault via a left transgluteal route. Moderate debris persists surrounding the drainage catheter raising the  question of catheter occlusion. 2. Status post fall rectal anastomosis with dehiscence of the anastomosis. The dehiscent cavity appears to communicate with small bowel within the right lower quadrant. Additionally, the terminal sigmoid colon demonstrates several fistulae, 1 of which communicates with a developing non dependent gas collection within the anterior pelvis measuring roughly 5.8 x 5.2 x 10.7 cm in dimension. 3. New percutaneous drainage catheter within the colonic mesentery with evacuation of the previously noted loculated fluid collection in this region. Multiple additional small loculated fluid collections are seen within the small bowel mesentery measuring up to 21 mm are Jason 2 but do not communicate with this collection. 4. Additional small loculated fluid collections within the small bowel mesentery, as described above, without clear percutaneous route for access. 5. Extensive infiltration of the mesentery in keeping with changes of peritonitis. 6. Anasarca with small bilateral pleural effusions and subcutaneous body wall edema. Aortic Atherosclerosis (ICD10-I70.0). Electronically Signed   By: Dorethia Molt M.D.   On: 06/02/2024 20:18       LOS: 26 days    Elgin Lam, MD [facility] 06/03/2024, 11:50 AM   If 7PM-7AM, please contact night-coverage [email or web address]

## 2024-06-03 NOTE — Progress Notes (Signed)
 Patient intubated post-surgery. [facility] will sign off as consults at this time as PCCM is managing medicine needs. Please re-consult [facility] as needed.  Elgin Lam, MD [facility] 06/03/2024, 8:09 PM

## 2024-06-03 NOTE — Progress Notes (Signed)
 PHARMACY - TOTAL PARENTERAL NUTRITION CONSULT NOTE   Indication: Prolonged ileus  Patient Measurements: Height: 5' 5 (165.1 cm) Weight: 61 kg (134 lb 7.7 oz) IBW/kg (Calculated) : 57 TPN AdjBW (KG): 61.3 Body mass index is 22.38 kg/m.  Assessment: 34 yoF with history of diverticulitis with perforation and abscess. 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 She requested ostomy takedown and underwent LOA, colostomy takedown, small bowel resection with anastomosis on 5/29. Pharmacy is consulted to dose TPN starting 6/2 for postop ileus.  Now with anastomotic leak complicated by multiple-intraabdominal abscesses.  Note that below labs are from 6/23 and were stable, will recheck 6/25 Glucose / Insulin : no Hx DM, A1c 5.1%. BG goal 100-180. -CBG/SSI discontinued on 6/11 Electrolytes: Na (132) slightly low but stable on max Na concentration -All others WNL, including CorrCa (10) Hepatic: LFTs, Tbili, TG WNL.  -Albumin  low. Alk Phos trending down slowly - Prealbumin 9 (6/24) I/O:  -Negligible PO intake, pt refusing to eat much per notes -No UOP or stool volume recorded (LBM 6/22), drain output ~ 100 ml which likely also accounts for scheduled flushes GI Imaging:  -6/16 CT: Similar appearance at the rectal anastomosis concerning for dehiscence -6/9 CT: Dempsey dehiscence along the rectal anastomosis, free air and multiple fluid collections GI Surgeries / Procedures: -5/29: LAR, SBR, colostomy takedown, LOA -6/10 drains placed x 2 in IR (midline abd, transgluteal) -6/17: Replacement of drainage catheter by IR -6/24: plan for fecal diversion procedure due to delayed colorectal anastomotic leak  Central access: Double lumen PICC placed 6/2 TPN start date: 6/2  Nutritional Goals: Goal TPN rate is 70 mL/hr (provides 94 g of protein and 1680 kcals per day)  RD Assessment:   Estimated Needs Total Energy Estimated Needs: 1600-1750 kcals Total Protein Estimated Needs: 80-95 grams Total Fluid Estimated Needs: >/= 1.6L  Current Nutrition:  -TPN -Diet: NPO for surgery  Multiple calorie counts this admission. Not meeting caloric needs. Refusing all supplements; hesitant to try many foods. Essentially eating 0% of all meals. Unlikely to meet caloric needs, recommendation for consideration of Cortrak placement by dietitian.  Plan:  Increase TPN back to goal of 70 mL/hr per RD recommendations and post op healing Electrolytes in TPN: No change Na 150 mEq/L (max) K 50 mEq/L Ca 5 mEq/L Mg 5 mEq/L Phos 15 mmol/L Cl:Ac 1:1 No CBG checks/SSI  MVI PO Monitor TPN labs on Mon/Thurs, and as needed.   Britta Eva Na, PharmD, BCPS Clinical Pharmacist 06/03/2024 9:46 AM

## 2024-06-03 NOTE — Consult Note (Signed)
 NAME:  Elizabeth Odom, MRN:  [id number], DOB:  June 08, 1941, LOS: 26 ADMISSION DATE:  05/08/2024, CONSULTATION DATE:  06/03/2024 REFERRING MD:  [name], MD, CHIEF COMPLAINT:  post-operative vent management   History of Present Illness:  Elizabeth Odom is a 83 y.o. woman who presented last fall for a large bowel obstruction and was found to have colonic resection and colostomy creation.  Her hospital course was complicated by anastomotic leak.  She presented last month for a colostomy resection and takedown.  Her clinical course was complicated by dense adhesions.  Postoperatively she experienced atrial fibrillation with RVR, polypharmacy and altered mental status, acute blood loss anemia, acute urinary retention.  She had intra-abdominal abscess with polymicrobial growth positive for Pseudomonas aeruginosa, MRSA, Candida, E faecalis.  She has been in the hospital for the last month.  She suffered poor p.o. intake and has been on TPN.  She went back to the OR today with Dr. Sheldon for lysis of adhesions, colostomy placement, drainage of abscess.  Ultimately a rectal tube was placed to drain her abscess collection from below.  This should be removed at some point.  Her colostomy was placed.  G-tube was placed for nutrition.  Intraoperatively she had episode of hypotension requiring vasopressors immediately upon insufflation to the abdomen.  This has since resolved.  She returned to the ICU intubated.  I discussed her care with Dr. Sheldon.  Pertinent  Medical History  HTN   Significant Hospital Events: Including procedures, antibiotic start and stop dates in addition to other pertinent events     Interim History / Subjective:    Objective    Blood pressure (!) 93/53, pulse 80, temperature 99.2 F (37.3 C), temperature source Oral, resp. rate 15, height 5' 5 (1.651 m), weight 61 kg, SpO2 95%.        Intake/Output Summary (Last 24 hours) at 06/03/2024 1605 Last data filed at 06/03/2024  1522 Gross per 24 hour  Intake 599.02 ml  Output 50 ml  Net 549.02 ml   Filed Weights   05/29/24 0500 05/31/24 0500 06/03/24 0423  Weight: 60.9 kg 58.8 kg 61 kg    Examination: Gen:      Intubated, sedated, acutely and chronically ill appearing HEENT:  ETT to vent, temporal wasting, NG in place Lungs:    sounds of mechanical ventilation auscultated no wheeze CV:         RRR Abd:      Ventral wound vac in place, left JP drain with serosanguinous drainage, PEG tube, ostomy pouch, all dressing CDI Ext:    No edema Skin:      Warm and dry; no rashes Neuro:   sedated, RASS -2  Resolved problem list   Assessment and Plan   Post-procedural vent management Sedation for mechanical ventilation - continue propofol . Will increase fentanyl  pushes, see if we can taper off propofol  since she is on tpn - lung protective ventilation, vap bundle, stress ulcer ppx - potential SBT and extubation 6/25 am.  Complex surgical abdomen POD 0 ex-lap for lysis of adhesions Diverting Transverse loop colostomy G tube placement Rectal tube placement (for abscess drainage) - care per surgery - maintain wound vac - continue TPN - no plans for repeat OR trip  Intra-abdominal abscess - Polymicrobial cultures - Currently on daptomycin , pip-tazo, micafungin  per ID.  Atrial Fibrillation - hold eliquis  today, can resume tomorrow - PRNs for rate control until she can take p.o  Severe protein calorie malnutrition - on tpn,  needs tube feeds when ok per surgery  Best Practice (right click and Reselect all SmartList Selections daily)   Diet/type: NPO on TPN. Per surgery to taper off and start TF maybe tomorrow.  DVT prophylaxis DOAC holding tonight, resume tomorrow Pressure ulcer(s): pressure ulcer assessment deferred  GI prophylaxis: H2B Lines: N/A Foley:  N/A Code Status:  full code Last date of multidisciplinary goals of care discussion [per primary]  Labs   CBC: Recent Labs  Lab  05/29/24 0409 06/02/24 0255  WBC 6.9 9.0  HGB 9.0* 9.0*  HCT 29.7* 29.8*  MCV 93.7 92.3  PLT 502* 424*    Basic Metabolic Panel: Recent Labs  Lab 05/28/24 0651 05/29/24 0409 06/02/24 0255  NA 133* 133* 132*  K 4.0 3.9 3.9  CL 101 100 101  CO2 23 24 23   GLUCOSE 125* 112* 105*  BUN 19 20 18   CREATININE 0.51 0.84 0.71  CALCIUM  7.9* 8.1* 8.1*  MG 1.9 1.8 1.8  PHOS 3.3 3.5 3.3   GFR: Estimated Creatinine Clearance: 47.9 mL/min (by C-G formula based on SCr of 0.71 mg/dL). Recent Labs  Lab 05/29/24 0409 06/02/24 0255  WBC 6.9 9.0    Liver Function Tests: Recent Labs  Lab 05/29/24 0409 06/02/24 0255  AST 18 25  ALT 26 26  ALKPHOS 198* 183*  BILITOT 0.7 0.9  PROT 6.6 7.1  ALBUMIN  1.6* 1.6*   No results for input(s): LIPASE, AMYLASE in the last 168 hours. No results for input(s): AMMONIA in the last 168 hours.  ABG    Component Value Date/Time   TCO2 25 04/08/2016 2316     Coagulation Profile: No results for input(s): INR, PROTIME in the last 168 hours.  Cardiac Enzymes: Recent Labs  Lab 05/30/24 0312  CKTOTAL 13*    HbA1C: Hgb A1c MFr Bld  Date/Time Value Ref Range Status  05/06/2024 11:45 AM 5.1 4.8 - 5.6 % Final    Comment:    (NOTE) Diagnosis of Diabetes The following HbA1c ranges recommended by the American Diabetes Association (ADA) may be used as an aid in the diagnosis of diabetes mellitus.  Hemoglobin             Suggested A1C NGSP%              Diagnosis  <5.7                   Non Diabetic  5.7-6.4                Pre-Diabetic  >6.4                   Diabetic  <7.0                   Glycemic control for                       adults with diabetes.    09/28/2023 12:25 AM 5.8 (H) 4.8 - 5.6 % Final    Comment:    (NOTE) Pre diabetes:          5.7%-6.4%  Diabetes:              >6.4%  Glycemic control for   <7.0% adults with diabetes     CBG: No results for input(s): GLUCAP in the last 168 hours.  Review  of Systems:   Unable to obtain due to patient condition.   Past Medical History:  She,  has  a past medical history of A-fib (HCC) (04/09/2016), Acute head injury (04/09/2016), Anemia, Blood dyscrasia, Cervical cancer (HCC), Cervical spine fracture, initial encounter (04/09/2016), Claustrophobia, Dupuytren disease of palm of both hands, Essential hypertension, Fatty liver, History of kidney stones, MCI (mild cognitive impairment) with memory loss, Multiple fractures of cervical spine (HCC) (04/09/2016), Restless leg, and Syncope and collapse (04/09/2016).   Surgical History:   Past Surgical History:  Procedure Laterality Date   APPENDECTOMY     BIOPSY  09/29/2023   Procedure: BIOPSY;  Surgeon: Saintclair Jasper, MD;  Location: [facility];  Service: Gastroenterology;;   breast lump removal Left    CERVICAL LAMINECTOMY     COLECTOMY WITH COLOSTOMY CREATION/HARTMANN PROCEDURE N/A 10/01/2023   Procedure: COLECTOMY WITH COLOSTOMY CREATION/HARTMANN PROCEDURE;  Surgeon: Rubin Calamity, MD;  Location: [facility];  Service: General;  Laterality: N/A;   complete hysterectomy     CYSTOSCOPY WITH INDOCYANINE GREEN  IMAGING (ICG) N/A 05/08/2024   Procedure: CYSTOSCOPY WITH INDOCYANINE GREEN  IMAGING (ICG);  Surgeon: Shane Steffan BROCKS, MD;  Location: [facility];  Service: Urology;  Laterality: N/A;   EP IMPLANTABLE DEVICE N/A 04/10/2016   Procedure: Loop Recorder Insertion;  Surgeon: Danelle LELON Birmingham, MD;  Location: [facility];  Service: Cardiovascular;  Laterality: N/A;   FLEXIBLE SIGMOIDOSCOPY N/A 09/29/2023   Procedure: FLEXIBLE SIGMOIDOSCOPY;  Surgeon: Saintclair Jasper, MD;  Location: [facility];  Service: Gastroenterology;  Laterality: N/A;   FLEXIBLE SIGMOIDOSCOPY N/A 05/08/2024   Procedure: SIGMOIDOSCOPY, FLEXIBLE;  Surgeon: [name], MD;  Location: [facility];  Service: General;  Laterality: N/A;   HARVEST BONE GRAFT     IR IVC FILTER PLMT / S&I PORTER GUID/MOD SED  10/05/2023   IR SINUS/FIST TUBE CHK-NON  GI  10/22/2023   IR SINUS/FIST TUBE CHK-NON GI  11/05/2023   LAPAROSCOPIC LYSIS OF ADHESIONS N/A 05/08/2024   Procedure: LYSIS, ADHESIONS, LAPAROSCOPIC;  Surgeon: [name], MD;  Location: [facility];  Service: General;  Laterality: N/A;  LYSIS OF ADHESIONS   LOOP RECORDER REMOVAL N/A 07/08/2018   Procedure: LOOP RECORDER REMOVAL;  Surgeon: Birmingham Danelle LELON, MD;  Location: [facility];  Service: Cardiovascular;  Laterality: N/A;   XI ROBOTIC ASSISTED COLOSTOMY TAKEDOWN N/A 05/08/2024   Procedure: CLOSURE, COLOSTOMY, ROBOT-ASSISTED -ROBOTIC RECTOSIGMOID RESECTION (LAR) -TAKEDOWN OF END COLOSTOMY WITH ANASTOMOSIS -RESECTION OF SMALL INTESTINE WITH ANASTOMOSIS -SMALL BOWEL REPAIR -LYSIS OF ADHESIONS x 115 MINUTES (66% OF CASE),  -INTRAOPERATIVE ASSESSMENT OF TISSUE VASCULAR PERFUSION USING ICG (indocyanine green ) IMMUNOFLUORESCENCE,  -TRANSVERSUS ABDOMINIS PLANE (TAP) BLOCK - BILATE     Social History:   reports that she has never smoked. She has never used smokeless tobacco. She reports that she does not drink alcohol  and does not use drugs.   Family History:  Her family history includes Cancer in her sister; Diabetes in her sister; Other in her mother; Stroke in her daughter; Thyroid  disease in her daughter.   Allergies Allergies  Allergen Reactions   Elemental Sulfur Anaphylaxis     Home Medications  Prior to Admission medications   Medication Sig Start Date End Date Taking? Authorizing Provider  amLODipine  (NORVASC ) 5 MG tablet Take 1 tablet (5 mg total) by mouth daily. Patient taking differently: Take 5 mg by mouth at bedtime. 04/08/24  Yes Patwardhan, Manish J, MD  ascorbic acid  (VITAMIN C ) 250 MG CHEW Chew 250 mg by mouth daily.   Yes [provider]  aspirin  EC 81 MG tablet Take 81 mg by mouth daily. Swallow whole.  Yes [provider]  cyanocobalamin  (VITAMIN B12) 1000 MCG tablet Take 1,000 mcg by mouth daily.   Yes [provider]  FIBER GUMMIES PO  Take 1 capsule by mouth daily.   Yes [provider]  Multiple Vitamin (MULTIVITAMIN WITH MINERALS) TABS tablet Take 1 tablet by mouth daily. 11/10/23  Yes Tobie Yetta HERO, MD  Omega-3 Fatty Acids (OMEGA 3 500 PO) Take 500 mg by mouth daily.   Yes [provider]  OVER THE COUNTER MEDICATION Take 650 mg by mouth daily. Total Beets supplement   Yes [provider]  traMADol  (ULTRAM ) 50 MG tablet Take 1-2 tablets (50-100 mg total) by mouth every 6 (six) hours as needed for moderate pain (pain score 4-6) or severe pain (pain score 7-10). 05/08/24  Yes [name], MD     Critical care time:      The patient is critically ill due to vent management.  Critical care was necessary to treat or prevent imminent or life-threatening deterioration.  Critical care was time spent personally by me on the following activities: development of treatment plan with patient and/or surrogate as well as nursing, discussions with consultants, evaluation of patient's response to treatment, examination of patient, obtaining history from patient or surrogate, ordering and performing treatments and interventions, ordering and review of laboratory studies, ordering and review of radiographic studies, pulse oximetry, re-evaluation of patient's condition and participation in multidisciplinary rounds.   Critical Care Time devoted to patient care services described in this note is 65 minutes. This time reflects time of care of this signee Douglas Rooks S Dimetri Armitage . This critical care time does not reflect separately billable procedures or procedure time, teaching time or supervisory time of PA/NP/Med student/Med Resident etc but could involve care discussion time.       Verdon GORMAN [facility] 06/03/2024 5:19 PM  Pager: see AMION  If no response to pager , please call critical care on call (see AMION) until 7pm After 7:00 pm call Elink

## 2024-06-04 ENCOUNTER — Encounter (HOSPITAL_COMMUNITY): Payer: Self-pay | Admitting: Surgery

## 2024-06-04 DIAGNOSIS — B952 Enterococcus as the cause of diseases classified elsewhere: Secondary | ICD-10-CM | POA: Diagnosis not present

## 2024-06-04 DIAGNOSIS — I4891 Unspecified atrial fibrillation: Secondary | ICD-10-CM | POA: Diagnosis not present

## 2024-06-04 DIAGNOSIS — K651 Peritoneal abscess: Secondary | ICD-10-CM | POA: Diagnosis not present

## 2024-06-04 DIAGNOSIS — B965 Pseudomonas (aeruginosa) (mallei) (pseudomallei) as the cause of diseases classified elsewhere: Secondary | ICD-10-CM | POA: Diagnosis not present

## 2024-06-04 DIAGNOSIS — K5790 Diverticulosis of intestine, part unspecified, without perforation or abscess without bleeding: Secondary | ICD-10-CM | POA: Diagnosis not present

## 2024-06-04 DIAGNOSIS — B9562 Methicillin resistant Staphylococcus aureus infection as the cause of diseases classified elsewhere: Secondary | ICD-10-CM | POA: Diagnosis not present

## 2024-06-04 LAB — AEROBIC/ANAEROBIC CULTURE W GRAM STAIN (SURGICAL/DEEP WOUND)

## 2024-06-04 LAB — CBC
HCT: 23.8 % — ABNORMAL LOW (ref 36.0–46.0)
Hemoglobin: 7.2 g/dL — ABNORMAL LOW (ref 12.0–15.0)
MCH: 27.2 pg (ref 26.0–34.0)
MCHC: 30.3 g/dL (ref 30.0–36.0)
MCV: 89.8 fL (ref 80.0–100.0)
Platelets: 370 10*3/uL (ref 150–400)
RBC: 2.65 MIL/uL — ABNORMAL LOW (ref 3.87–5.11)
RDW: 16.5 % — ABNORMAL HIGH (ref 11.5–15.5)
WBC: 12.4 10*3/uL — ABNORMAL HIGH (ref 4.0–10.5)
nRBC: 0 % (ref 0.0–0.2)

## 2024-06-04 LAB — MAGNESIUM: Magnesium: 1.8 mg/dL (ref 1.7–2.4)

## 2024-06-04 LAB — COMPREHENSIVE METABOLIC PANEL WITH GFR
ALT: 23 U/L (ref 0–44)
AST: 34 U/L (ref 15–41)
Albumin: 1.7 g/dL — ABNORMAL LOW (ref 3.5–5.0)
Alkaline Phosphatase: 129 U/L — ABNORMAL HIGH (ref 38–126)
Anion gap: 9 (ref 5–15)
BUN: 23 mg/dL (ref 8–23)
CO2: 22 mmol/L (ref 22–32)
Calcium: 7.7 mg/dL — ABNORMAL LOW (ref 8.9–10.3)
Chloride: 101 mmol/L (ref 98–111)
Creatinine, Ser: 0.77 mg/dL (ref 0.44–1.00)
GFR, Estimated: >60 mL/min (ref >60)
Glucose, Bld: 151 mg/dL — ABNORMAL HIGH (ref 70–99)
Potassium: 3.9 mmol/L (ref 3.5–5.1)
Sodium: 132 mmol/L — ABNORMAL LOW (ref 135–145)
Total Bilirubin: 1.2 mg/dL (ref 0.0–1.2)
Total Protein: 6.2 g/dL — ABNORMAL LOW (ref 6.5–8.1)

## 2024-06-04 LAB — HEPARIN LEVEL (UNFRACTIONATED): Heparin Unfractionated: 0.6 [IU]/mL (ref 0.30–0.70)

## 2024-06-04 LAB — GLUCOSE, CAPILLARY
Glucose-Capillary: 135 mg/dL — ABNORMAL HIGH (ref 70–99)
Glucose-Capillary: 136 mg/dL — ABNORMAL HIGH (ref 70–99)
Glucose-Capillary: 151 mg/dL — ABNORMAL HIGH (ref 70–99)
Glucose-Capillary: 160 mg/dL — ABNORMAL HIGH (ref 70–99)
Glucose-Capillary: 163 mg/dL — ABNORMAL HIGH (ref 70–99)

## 2024-06-04 LAB — TRIGLYCERIDES: Triglycerides: 150 mg/dL — ABNORMAL HIGH (ref <150)

## 2024-06-04 LAB — PHOSPHORUS: Phosphorus: 2.8 mg/dL (ref 2.5–4.6)

## 2024-06-04 LAB — APTT: aPTT: 41 s — ABNORMAL HIGH (ref 24–36)

## 2024-06-04 MED ORDER — PIPERACILLIN-TAZOBACTAM 3.375 G IVPB
3.3750 g | Freq: Three times a day (TID) | INTRAVENOUS | Status: AC
  Administered 2024-06-04 – 2024-06-09 (×15): 3.375 g via INTRAVENOUS
  Filled 2024-06-04 (×15): qty 50

## 2024-06-04 MED ORDER — MAGNESIUM SULFATE 2 GM/50ML IV SOLN
2.0000 g | Freq: Once | INTRAVENOUS | Status: AC
  Administered 2024-06-04: 2 g via INTRAVENOUS
  Filled 2024-06-04: qty 50

## 2024-06-04 MED ORDER — LACTATED RINGERS IV BOLUS
1000.0000 mL | Freq: Once | INTRAVENOUS | Status: AC
  Administered 2024-06-04: 1000 mL via INTRAVENOUS

## 2024-06-04 MED ORDER — INSULIN ASPART 100 UNIT/ML IJ SOLN
0.0000 [IU] | Freq: Four times a day (QID) | INTRAMUSCULAR | Status: DC
  Administered 2024-06-04: 2 [IU] via SUBCUTANEOUS
  Administered 2024-06-04 – 2024-06-05 (×2): 1 [IU] via SUBCUTANEOUS
  Administered 2024-06-05: 2 [IU] via SUBCUTANEOUS
  Administered 2024-06-06 – 2024-06-07 (×5): 1 [IU] via SUBCUTANEOUS

## 2024-06-04 MED ORDER — FENTANYL CITRATE PF 50 MCG/ML IJ SOSY
25.0000 ug | PREFILLED_SYRINGE | Freq: Once | INTRAMUSCULAR | Status: AC
  Administered 2024-06-04: 25 ug via INTRAVENOUS

## 2024-06-04 MED ORDER — ORAL CARE MOUTH RINSE
15.0000 mL | OROMUCOSAL | Status: DC
  Administered 2024-06-04 – 2024-06-21 (×59): 15 mL via OROMUCOSAL

## 2024-06-04 MED ORDER — METOPROLOL TARTRATE 5 MG/5ML IV SOLN
2.5000 mg | Freq: Four times a day (QID) | INTRAVENOUS | Status: DC | PRN
  Administered 2024-06-04 – 2024-06-19 (×4): 2.5 mg via INTRAVENOUS
  Filled 2024-06-04 (×4): qty 5

## 2024-06-04 MED ORDER — DAPTOMYCIN-SODIUM CHLORIDE 500-0.9 MG/50ML-% IV SOLN
8.0000 mg/kg | Freq: Every day | INTRAVENOUS | Status: AC
  Administered 2024-06-04 – 2024-06-08 (×5): 500 mg via INTRAVENOUS
  Filled 2024-06-04 (×5): qty 50

## 2024-06-04 MED ORDER — TRAVASOL 10 % IV SOLN
INTRAVENOUS | Status: AC
  Filled 2024-06-04: qty 940.8

## 2024-06-04 MED ORDER — NYSTATIN 100000 UNIT/GM EX POWD
Freq: Two times a day (BID) | CUTANEOUS | Status: DC
  Administered 2024-06-24: 1 via TOPICAL
  Filled 2024-06-04 (×3): qty 15

## 2024-06-04 MED ORDER — HEPARIN (PORCINE) 25000 UT/250ML-% IV SOLN
1550.0000 [IU]/h | INTRAVENOUS | Status: DC
  Administered 2024-06-04: 900 [IU]/h via INTRAVENOUS
  Administered 2024-06-05: 1100 [IU]/h via INTRAVENOUS
  Administered 2024-06-06 – 2024-06-10 (×7): 1550 [IU]/h via INTRAVENOUS
  Filled 2024-06-04 (×9): qty 250

## 2024-06-04 MED ORDER — ADULT MULTIVITAMIN LIQUID CH
15.0000 mL | Freq: Every day | ORAL | Status: DC
  Filled 2024-06-04: qty 15

## 2024-06-04 MED ORDER — FENTANYL CITRATE PF 50 MCG/ML IJ SOSY
12.5000 ug | PREFILLED_SYRINGE | INTRAMUSCULAR | Status: DC | PRN
  Administered 2024-06-04: 25 ug via INTRAVENOUS
  Administered 2024-06-04: 12.5 ug via INTRAVENOUS
  Administered 2024-06-05 (×2): 25 ug via INTRAVENOUS
  Administered 2024-06-06: 12.5 ug via INTRAVENOUS
  Filled 2024-06-04 (×6): qty 1

## 2024-06-04 MED ORDER — FAMOTIDINE IN NACL 20-0.9 MG/50ML-% IV SOLN: 20.0000 mg | Freq: Two times a day (BID) | INTRAVENOUS | Status: DC

## 2024-06-04 NOTE — Progress Notes (Signed)
 06/04/2024  Elizabeth Odom [id number] 05/18/1941  CARE TEAM: PCP: Jesus Bernardino MATSU, MD  Outpatient Care Team: Patient Care Team: Jesus Bernardino MATSU, MD as PCP - General (Internal Medicine) Elmira Newman PARAS, MD as PCP - Cardiology (Cardiology) [name], MD as Consulting Physician (Gastroenterology) Rubin Calamity, MD as Consulting Physician (General Surgery) [name], MD (Neurology) [name], MD as Consulting Physician (Urology) [name], MD as Consulting Physician (Cardiology) [name], MD as Consulting Physician (Colon and Rectal Surgery)  Inpatient Treatment Team: Treatment Team:  [name], MD Nikki Rams, Aliene, MD [facility] Radiology, MD Allred, Harman POUR, PA-C Dennise Kingsley, MD [name], RN Pccm, Md, MD Meade Verdon RAMAN, MD Rosan Deward ORN, NP Shona Ward CROME, RN Margrette Candy DASEN, RN   Problem List:   Principal Problem:   Diverticular disease Active Problems:   A-fib [facility])   Restless leg   ABLA (acute blood loss anemia)   Need for emotional support   Acute urinary retention   Mixed hyperlipidemia   Essential hypertension   Diverticular disease of left colon   History of DVT (deep vein thrombosis)   History of pulmonary embolus (PE)   Pre-diabetes   Presence of IVC filter   Stricture of sigmoid colon (HCC)   Protein-calorie malnutrition, severe   Hypokalemia   05/08/2024  POST-OPERATIVE DIAGNOSIS:   COLOSTOMY FOR RESECTION, DESIRE FOR OSTOMY TAKEDOWN RECTAL STRICTURE HISTORY OF DIVERTICULITIS WITH PERFORATION & ABSCESS   PROCEDURE:   -ROBOTIC RECTOSIGMOID RESECTION (LAR) -TAKEDOWN OF END COLOSTOMY WITH ANASTOMOSIS -RESECTION OF SMALL INTESTINE WITH ANASTOMOSIS -SMALL BOWEL REPAIR -LYSIS OF ADHESIONS x 115 MINUTES (66% OF CASE),  -INTRAOPERATIVE ASSESSMENT OF TISSUE VASCULAR PERFUSION USING ICG (indocyanine green ) IMMUNOFLUORESCENCE,  -TRANSVERSUS ABDOMINIS PLANE (TAP) BLOCK -  BILATERAL -FLEXIBLE SIGMOIDOSCOPY   SURGEON:  [name], MD   05/20/2024  Post procedural Dx: Post op abscess, multiple   Technically successful CT guided placed of a  10 Fr drainage catheter placement x 2  into the midline ventral and dorsal perirectal abscesses.     [name] LABOR, MD    05/28/2024  IMPRESSION: Satisfactory removal of the midline anterior abscess drainage catheter and placement of a new midline anterior pelvic drainage catheter as well as a posterior transgluteal drainage catheter in pelvic abscesses.     Electronically Signed   By: [name]    06/03/2024  POST-OPERATIVE DIAGNOSIS:   DELAYED COLORECTAL ANASTOMOTIC LEAK, NEED FOR FECAL DIVERSION CHRONIC PELVIC ABSCESS   PROCEDURE:   DIAGNOSTIC LAPAROSCOPY CONVERTED TO LAPAROTOMY DIVERTING LOOP TRANSVERSE COLOSTOMY DRAINAGE OF ABDOMINAL ABSCESS GASTROSTOMY TUBE PLACEMENT RECTAL TUBE PLACEMENT   SURGEON:  [name], MD   OR FINDINGS: Very dense concrete intraperitoneal and pelvic adhesions infraumbilically.  Bladder, small bowel, omentum walling off the lower pelvis with delayed anastomotic leak.   Near disruption of colorectal anastomosis by transanal examination.  Rectal tube placed through rectal stump to drain the pelvis more directly.   Diverting mid-transverse loop colostomy done.   Removal of old percutaneous abdominal and transgluteal drains.     Assessment [facility] Stay = 27 days) 1 Day Post-Op    Failure to thrive with delayed anastomotic leak    Plan:  Fecal diversion with diverting transverse colostomy (lower abdomen extremely fixed).   exploration 6/25 with very fixed abdomen.    Internal drainage with rectal tube.  Evacuate most of the remaining stool from left colon IntraOp.  Not much from rectal tube.  I removed it .  Should be able to drain transanally.    Intraperitoneal drainage going over near prior abdominal abscess down towards left pelvis.   Keep for now.  TPN at 100% for now.  I removed NG tube.  Keep surgical gastrostomy G-tube to gravity.  If no plans for extubation can start trophic tube feeds.  Will advance to goal if she has return of bowel function with flatus and colostomy.  Antibiotics.  Because she has source control, I would lean towards doing 5 more days and stopping to regroup since there was concern of the MRSA and Candida being colonization may be the only legitimate organism is Pseudomonas of colonization anyway.  Redo MRSA decolonization.  See what infectious disease thinks.  Minimal vent settings.  Hopefully can wean and extubate today or soon.  We will see.  Some recurrent atrial fibrillation and rapid rate.  Defer to critical care and cardiology of placing back on intermittent metoprolol .  Anticoagulation as needed given her history of thromboembolic events with IVC filter in atrial fibrillation.  I would not trust the digestive tract of function until she has flatus and bowel movements so would lean towards heparin  drip if that is preferred.  Follow hemoglobin closely.    -monitor electrolytes & replace as needed.  Keep K>4, Mg>2, Phos>3  -VTE prophylaxis- SCDs.  Anticoagulation as appropriate  -mobilize as tolerated to help recovery.  Enlist therapies in moderate/high risk patients as appropriate.  Patient is refusing skilled facility or any rehab but is not getting up much.  Try to have strategies to help her get up and mobilize and actually work with physical therapy, or she is going to lose her argument.    I updated the patient's status to the patient and nurse.  Recommendations were made.  Questions were answered.  They expressed understanding & appreciation.  -Disposition: TBD - I think she needs SNF.  She has been very resistant, but she may reconsider      I reviewed last 24 h vitals and pain scores, last 48 h intake and output, last 24 h labs and trends, and last 24 h imaging results.  I have  reviewed this patient's available data, including medical history, events of note, test results, etc as part of my evaluation.   A significant portion of that time was spent in counseling. Care during the described time interval was provided by me.  This care required moderate level of medical decision making.  06/04/2024    Subjective: (Chief complaint)  Not needing pressors or volume.  Intermittent tachycardia and rate changes but back in sinus rhythm.  ICU nurse at bedside.    Objective:  Vital signs:  Vitals:   06/03/24 1955 06/03/24 2359 06/04/24 0400 06/04/24 0500  BP:      Pulse:      Resp:      Temp: (!) 97.5 F (36.4 C) 98.4 F (36.9 C) 98.6 F (37 C)   TempSrc: Axillary Axillary Axillary   SpO2:      Weight:    63.6 kg  Height:        Last BM Date : 06/03/24  Intake/Output   Yesterday:  06/24 0701 - 06/25 0700 In: 3287.4 [I.V.:2490.4; IV Piggyback:797.1] Out: 905 [Urine:630; Emesis/NG output:100; Drains:150; Blood:25] This shift:  Total I/O In: 1014.1 [I.V.:962.5; IV Piggyback:51.6] Out: 580 [Urine:330; Emesis/NG output:100; Drains:150]  Bowel function:  Flatus: No  BM:  No   Drain:  Left lower quadrant Blake drain serosanguineous  going down to left lateral pelvis.  Rectal tube with minimal liquid stool output.  I removed 6/25    Physical Exam:  General: Intubated sedated.  No focal motor deficits.         Eyes: PERRL, normal EOM.  Sclera clear.  No icterus Neuro: CN II-XII intact w/o focal sensory/motor deficits.  No facial droop. Lymph: No head/neck/groin lymphadenopathy Psych:  No delerium/psychosis/paranoia.  Oriented x 4 HENT: Normocephalic, Mucus membranes moist.  No thrush.  Mild HOH Neck: Supple, No tracheal deviation.  No obvious thyromegaly.  Prefers to sit with head flexed and chin down.  Does not want to have her neck back.  Pillow adjusted. Chest: No pain to chest wall compression.  Good respiratory excursion.  No audible  wheezing CV:  Pulses intact.  Regular rhythm.  No major extremity edema MS: Normal AROM mjr joints.  No obvious deformity  Abdomen:  Soft.  Moderately distended.  Nontender.  No guarding.   Right upper quadrant mid transverse diverting loop colostomy pink with edema.  Minimal stool and gas   GU: Fortunately with light orange clear urine  Rectal: Some erythema but improved overall.  No diarrhea in bed.  Removed rectal tube. Ext:   No deformity.  No mjr edema.  No cyanosis Skin: No petechiae / purpurea.  No major sores.  Warm and dry    Results:   Cultures: Recent Results (from the past 720 hours)  Urine Culture     Status: Abnormal   Collection Time: 05/09/24  8:30 PM   Specimen: Urine, Clean Catch  Result Value Ref Range Status   Specimen Description   Final    URINE, CLEAN CATCH Performed at [facility], [address] [place], [place] [address]    Special Requests   Final    NONE Performed at [facility], [address] [place], [place] [address]    Culture (A)  Final    <10,000 COLONIES/mL INSIGNIFICANT GROWTH Performed at [facility], [address], [place], [place] [address]    Report Status 05/11/2024 FINAL  Final  MIC (1 Drug)-Abdominal drain abscess; 05/22/2024; Abdomen; MRSA; Daptomycin      Status: Abnormal   Collection Time: 05/22/24  1:54 PM   Specimen: Abdomen  Result Value Ref Range Status   Min Inhibitory Conc (1 Drug) Final report (A)  Corrected    Comment: (NOTE) Performed At: [facility] [address] [place], [place] [id number] Jennette Shorter MD Ey:[id number] CORRECTED ON 06/24 AT 0836: PREVIOUSLY REPORTED AS Preliminary report    Source ABSCESS  Final    Comment: Performed at [facility], [address] [place], [place] [address]  MIC Result     Status: Abnormal   Collection Time: 05/22/24  1:54 PM  Result Value Ref Range Status   Result 1 (MIC) Comment (A)  Final    Comment:  (NOTE) Methicillin - resistant Staphylococcus aureus Identification performed by account, not confirmed by this laboratory. DAPTOMYCIN    <=1 UG/ML = SUSCEPTIBLE Performed At: [facility] [address] [place], [place] [id number] Jennette Shorter MD Ey:[id number]   Aerobic/Anaerobic Culture w Gram Stain (surgical/deep wound)     Status: None (Preliminary result)   Collection Time: 05/22/24  3:59 PM   Specimen: Abdomen  Result Value Ref Range Status   Specimen Description   Final    ABDOMEN Performed at [facility], [address], [place], [place] [address]    Special  Requests   Final    ABDOMINAL DRAIN Performed at [facility], [address] [place], [place] [address]    Gram Stain   Final    ABUNDANT WBC PRESENT, PREDOMINANTLY PMN RARE GRAM POSITIVE COCCI RARE YEAST WITH PSEUDOHYPHAE    Culture   Final    MODERATE PSEUDOMONAS AERUGINOSA MODERATE STAPHYLOCOCCUS AUREUS SUSCEPTIBILITIES PERFORMED ON PREVIOUS CULTURE WITHIN THE LAST 5 DAYS. MODERATE CANDIDA ALBICANS Sent to [facility] for further susceptibility testing. NO ANAEROBES ISOLATED Performed at [facility], [address], [place], [place] [address]    Report Status PENDING  Incomplete   Organism ID, Bacteria PSEUDOMONAS AERUGINOSA  Final      Susceptibility   Pseudomonas aeruginosa - MIC*    CEFTAZIDIME 4 SENSITIVE Sensitive     CIPROFLOXACIN <=0.25 SENSITIVE Sensitive     GENTAMICIN 4 SENSITIVE Sensitive     IMIPENEM 2 SENSITIVE Sensitive     PIP/TAZO <=4 SENSITIVE Sensitive ug/mL    CEFEPIME 2 SENSITIVE Sensitive     * MODERATE PSEUDOMONAS AERUGINOSA  Aerobic/Anaerobic Culture w Gram Stain (surgical/deep wound)     Status: None   Collection Time: 05/22/24  3:59 PM   Specimen: Back  Result Value Ref Range Status   Specimen Description   Final    BACK Performed at [facility], [address], [place], [place] [address]    Special  Requests   Final    TRANSGLUTEAL PERCUTANEOUS IR DRAIN Performed at [facility], [address], [place], [place] [address]    Gram Stain   Final    NO WBC SEEN ABUNDANT GRAM NEGATIVE RODS RARE GRAM POSITIVE COCCI IN PAIRS RARE BUDDING YEAST SEEN    Culture   Final    ABUNDANT PSEUDOMONAS AERUGINOSA ABUNDANT METHICILLIN RESISTANT STAPHYLOCOCCUS AUREUS ABUNDANT ENTEROCOCCUS FAECALIS ABUNDANT CANDIDA ALBICANS NO ANAEROBES ISOLATED Sent to [facility] for further susceptibility testing. Performed at [facility], [address], [place], [place] [address]    Report Status 05/27/2024 FINAL  Final   Organism ID, Bacteria PSEUDOMONAS AERUGINOSA  Final   Organism ID, Bacteria METHICILLIN RESISTANT STAPHYLOCOCCUS AUREUS  Final   Organism ID, Bacteria ENTEROCOCCUS FAECALIS  Final      Susceptibility   Enterococcus faecalis - MIC*    AMPICILLIN <=2 SENSITIVE Sensitive     VANCOMYCIN  1 SENSITIVE Sensitive     GENTAMICIN SYNERGY SENSITIVE Sensitive     * ABUNDANT ENTEROCOCCUS FAECALIS   Methicillin resistant staphylococcus aureus - MIC*    CIPROFLOXACIN >=8 RESISTANT Resistant     ERYTHROMYCIN  >=8 RESISTANT Resistant     GENTAMICIN <=0.5 SENSITIVE Sensitive     OXACILLIN >=4 RESISTANT Resistant     TETRACYCLINE <=1 SENSITIVE Sensitive     VANCOMYCIN  1 SENSITIVE Sensitive     TRIMETH/SULFA >=320 RESISTANT Resistant     CLINDAMYCIN <=0.25 SENSITIVE Sensitive     RIFAMPIN <=0.5 SENSITIVE Sensitive     Inducible Clindamycin NEGATIVE Sensitive     LINEZOLID 2 SENSITIVE Sensitive     * ABUNDANT METHICILLIN RESISTANT STAPHYLOCOCCUS AUREUS   Pseudomonas aeruginosa - MIC*    CEFTAZIDIME 4 SENSITIVE Sensitive     CIPROFLOXACIN <=0.25 SENSITIVE Sensitive     GENTAMICIN 4 SENSITIVE Sensitive     IMIPENEM 2 SENSITIVE Sensitive     PIP/TAZO <=4 SENSITIVE Sensitive ug/mL    CEFEPIME 2 SENSITIVE Sensitive     * ABUNDANT PSEUDOMONAS AERUGINOSA  Yeast Susceptibilities      Status: None  Collection Time: 05/22/24  3:59 PM  Result Value Ref Range Status   SOURCE CANDIDA ALBICAN ABD ABSC  Final    Comment: Performed at [facility], [address] [place], [place] [address]   Organism ID, Yeast Candida albicans  Final    Comment: (NOTE) Identification performed by account, not confirmed by this laboratory.    Amphotericin B MIC 0.25 ug/mL  Final    Comment: (NOTE) Breakpoints have been established for only some organism-drug combinations as indicated. This test was developed and its performance characteristics determined by [facility]. It has not been cleared or approved by the Food and Drug Administration.    Anidulafungin MIC Comment  Final    Comment: (NOTE) 0.03 ug/mL Susceptible Breakpoints have been established for only some organism-drug combinations as indicated. This test was developed and its performance characteristics determined by [facility]. It has not been cleared or approved by the Food and Drug Administration.    Caspofungin MIC Comment  Final    Comment: (NOTE) 0.12 ug/mL Susceptible Breakpoints have been established for only some organism-drug combinations as indicated. This test was developed and its performance characteristics determined by [facility]. It has not been cleared or approved by the Food and Drug Administration.    Fluconazole  Islt MIC 1.0 ug/mL Susceptible  Final    Comment: (NOTE) Breakpoints have been established for only some organism-drug combinations as indicated. This test was developed and its performance characteristics determined by [facility]. It has not been cleared or approved by the Food and Drug Administration.    ISAVUCONAZOLE MIC 0.008 ug/mL or less  Final    Comment: (NOTE) This test was developed and its performance characteristics determined by [facility]. It has not been cleared or approved by the Food and Drug Administration.    Itraconazole MIC 0.06 ug/mL  Final    Comment:  (NOTE) Breakpoints have been established for only some organism-drug combinations as indicated. This test was developed and its performance characteristics determined by [facility]. It has not been cleared or approved by the Food and Drug Administration.    Micafungin  MIC Comment  Final    Comment: (NOTE) 0.008 ug/mL or less, Susceptible Breakpoints have been established for only some organism-drug combinations as indicated. This test was developed and its performance characteristics determined by [facility]. It has not been cleared or approved by the Food and Drug Administration.    Posaconazole MIC 0.06 ug/mL  Final    Comment: (NOTE) Breakpoints have been established for only some organism-drug combinations as indicated. This test was developed and its performance characteristics determined by [facility]. It has not been cleared or approved by the Food and Drug Administration.    REZAFUNGIN MIC Comment  Final    Comment: (NOTE) 0.15 ug/mL Susceptible This test was developed and its performance characteristics determined by [facility]. It has not been cleared or approved by the Food and Drug Administration.    Voriconazole MIC Comment  Final    Comment: (NOTE) 0.15 ug/mL Susceptible Breakpoints have been established for only some organism-drug combinations as indicated. This test was developed and its performance characteristics determined by [facility]. It has not been cleared or approved by the Food and Drug Administration. Performed At: [facility] [address] [place], [place] [id number] Jennette Shorter MD Ey:[id number]   Aerobic/Anaerobic Culture w Gram Stain (surgical/deep wound)     Status: None   Collection Time: 05/27/24  5:19 PM   Specimen: Abscess  Result Value Ref Range Status   Specimen Description  Final    ABSCESS ABDOMEN Performed at [facility], [address], [place], [place] [address]    Special Requests   Final     NONE Performed at [facility], [address] [place], [place] [address]    Gram Stain   Final    ABUNDANT WBC PRESENT, PREDOMINANTLY PMN NO ORGANISMS SEEN    Culture   Final    RARE PSEUDOMONAS AERUGINOSA RARE CANDIDA ALBICANS NO ANAEROBES ISOLATED Performed at [facility], [address] [place], [place] [address]    Report Status 06/01/2024 FINAL  Final   Organism ID, Bacteria PSEUDOMONAS AERUGINOSA  Final      Susceptibility   Pseudomonas aeruginosa - MIC*    CEFTAZIDIME 4 SENSITIVE Sensitive     CIPROFLOXACIN <=0.25 SENSITIVE Sensitive     GENTAMICIN 4 SENSITIVE Sensitive     IMIPENEM 2 SENSITIVE Sensitive     PIP/TAZO <=4 SENSITIVE Sensitive ug/mL    CEFEPIME 2 SENSITIVE Sensitive     * RARE PSEUDOMONAS AERUGINOSA  Surgical pcr screen     Status: Abnormal   Collection Time: 06/02/24  7:55 PM   Specimen: Nasal Mucosa; Nasal Swab  Result Value Ref Range Status   MRSA, PCR POSITIVE (A) NEGATIVE Final   Staphylococcus aureus POSITIVE (A) NEGATIVE Final    Comment: (NOTE) The Xpert SA Assay (FDA approved for NASAL specimens in patients 85 years of age and older), is one component of a comprehensive surveillance program. It is not intended to diagnose infection nor to guide or monitor treatment. Performed at [facility], [address], [place], [place] [address]     Labs: Results for orders placed or performed during the hospital encounter of 05/08/24 (from the past 48 hours)  Prealbumin     Status: Abnormal   Collection Time: 06/02/24  4:52 PM  Result Value Ref Range   Prealbumin 9 (L) 18 - 38 mg/dL    Comment: Performed at [facility], [address], [place], [place] [address]  Surgical pcr screen     Status: Abnormal   Collection Time: 06/02/24  7:55 PM   Specimen: Nasal Mucosa; Nasal Swab  Result Value Ref Range   MRSA, PCR POSITIVE (A) NEGATIVE   Staphylococcus aureus POSITIVE (A) NEGATIVE    Comment:  (NOTE) The Xpert SA Assay (FDA approved for NASAL specimens in patients 3 years of age and older), is one component of a comprehensive surveillance program. It is not intended to diagnose infection nor to guide or monitor treatment. Performed at [facility], [address], [place], [place] [address]   Type and screen [facility] Now for preop     Status: None   Collection Time: 06/03/24 11:45 AM  Result Value Ref Range   ABO/RH(D) A POS    Antibody Screen NEG    Sample Expiration      06/06/2024,2359 Performed at [facility], [address], [place], [place] [address]   Blood gas, arterial     Status: Abnormal   Collection Time: 06/03/24  5:40 PM  Result Value Ref Range   FIO2 80 %   Delivery systems VENTILATOR    Mode PRESSURE REGULATED VOLUME CONTROL    MECHVT 450 mL   RATE 20 resp/min   PEEP 5 cm H20   pH, Arterial 7.37 7.35 - 7.45   pCO2 arterial 35 32 - 48 mmHg   pO2, Arterial 197 (  H) 83 - 108 mmHg   Bicarbonate 20.3 20.0 - 28.0 mmol/L   Acid-base deficit 4.5 (H) 0.0 - 2.0 mmol/L   O2 Saturation 100 %   Patient temperature 36.3    Collection site A-LINE DRAW    Drawn by [phone]     Comment: Performed at [facility], [address], [place], [place] [address]  Glucose, capillary     Status: Abnormal   Collection Time: 06/03/24  7:53 PM  Result Value Ref Range   Glucose-Capillary 163 (H) 70 - 99 mg/dL    Comment: Glucose reference range applies only to samples taken after fasting for at least 8 hours.  Glucose, capillary     Status: Abnormal   Collection Time: 06/03/24 11:57 PM  Result Value Ref Range   Glucose-Capillary 152 (H) 70 - 99 mg/dL    Comment: Glucose reference range applies only to samples taken after fasting for at least 8 hours.  Glucose, capillary     Status: Abnormal   Collection Time: 06/04/24  4:00 AM  Result Value Ref Range   Glucose-Capillary 151 (H) 70 - 99 mg/dL     Comment: Glucose reference range applies only to samples taken after fasting for at least 8 hours.  Comprehensive metabolic panel with GFR     Status: Abnormal   Collection Time: 06/04/24  6:09 AM  Result Value Ref Range   Sodium 132 (L) 135 - 145 mmol/L   Potassium 3.9 3.5 - 5.1 mmol/L   Chloride 101 98 - 111 mmol/L   CO2 22 22 - 32 mmol/L   Glucose, Bld 151 (H) 70 - 99 mg/dL    Comment: Glucose reference range applies only to samples taken after fasting for at least 8 hours.   BUN 23 8 - 23 mg/dL   Creatinine, Ser 9.22 0.44 - 1.00 mg/dL   Calcium  7.7 (L) 8.9 - 10.3 mg/dL   Total Protein 6.2 (L) 6.5 - 8.1 g/dL   Albumin  1.7 (L) 3.5 - 5.0 g/dL   AST 34 15 - 41 U/L   ALT 23 0 - 44 U/L   Alkaline Phosphatase 129 (H) 38 - 126 U/L   Total Bilirubin 1.2 0.0 - 1.2 mg/dL   GFR, Estimated >39 >39 mL/min    Comment: (NOTE) Calculated using the CKD-EPI Creatinine Equation (2021)    Anion gap 9 5 - 15    Comment: Performed at [facility], [address], [place], [place] [address]  Magnesium      Status: None   Collection Time: 06/04/24  6:09 AM  Result Value Ref Range   Magnesium  1.8 1.7 - 2.4 mg/dL    Comment: Performed at [facility], [address] [place], [place] [address]  Phosphorus     Status: None   Collection Time: 06/04/24  6:09 AM  Result Value Ref Range   Phosphorus 2.8 2.5 - 4.6 mg/dL    Comment: Performed at [facility], [address], [place], [place] [address]    Imaging / Studies: DG CHEST PORT 1 VIEW Result Date: 06/03/2024 CLINICAL DATA:  Evaluation to confirm nasogastric and endotracheal tube placement. EXAM: PORTABLE CHEST 1 VIEW COMPARISON:  Chest radiograph 05/12/2024. FINDINGS: Endotracheal tube with tip overlying the trachea just below the level of the clavicular heads approximately 5 cm above the carina. Endotracheal tube with tip overlying the left upper quadrant and side hole within approximately 1  cm of the GE junction. Redemonstrated right PICC  terminating in the lower SVC. Lungs are well inflated. Mild prominence of the central pulmonary vasculature. Opacities over the medial right lung base. No pleural effusion or pneumothorax. Similar appearance of the cardiomediastinal silhouette. IVC filter partially visualized. IMPRESSION: Endotracheal tube tip just below the level of the clavicular heads. Enteric tube with side hole just below the GE junction, recommend advancement. Mild prominence of the central pulmonary vasculature without overt pulmonary edema. Electronically Signed   By: Donnice Mania M.D.   On: 06/03/2024 19:33   CT ABDOMEN PELVIS W CONTRAST Result Date: 06/02/2024 CLINICAL DATA:  Postoperative abdominal pain, pelvic abscess, anastomotic leak. Status post colostomy takedown EXAM: CT ABDOMEN AND PELVIS WITH CONTRAST TECHNIQUE: Multidetector CT imaging of the abdomen and pelvis was performed using the standard protocol following bolus administration of intravenous contrast. RADIATION DOSE REDUCTION: This exam was performed according to the departmental dose-optimization program which includes automated exposure control, adjustment of the mA and/or kV according to patient size and/or use of iterative reconstruction technique. CONTRAST:  OMNIPAQUE  IOHEXOL  300 MG/ML  SOLN COMPARISON:  05/26/2024 FINDINGS: Lower chest: Small bilateral pleural effusions are present with associated bibasilar compressive atelectasis. Mild cardiomegaly. Hepatobiliary: No focal liver abnormality is seen. No gallstones, gallbladder wall thickening, or biliary dilatation. Pancreas: Unremarkable Spleen: Unremarkable Adrenals/Urinary Tract: The adrenal glands are unremarkable. The kidneys are normal in size and position. Stable minimally complex 7.2 cm right lower pole renal cyst demonstrating a single thin partially calcified septum most in keeping with a Bosniak class 2 cyst. No follow-up imaging is recommended.  Punctate nonobstructing renal calculi are seen kidneys bilaterally measuring up to 2 mm. No hydronephrosis. Gas is again seen non dependently within bladder lumen, nonspecific, possibly related to recent catheterization. There is asymmetric bladder wall thickening involving the bladder dome anteriorly which may relate to the adjacent inflammatory process. Stomach/Bowel: Colorectal anastomoses appears widely dehiscent. Interval percutaneous drainage catheter has been placed into the rectal vault via a left transgluteal route. Moderate debris persists surrounding the drainage catheter raising the question of catheter occlusion. The dehiscent cavity appears to communicate with small bowel within the right lower quadrant (61/2. Additionally, the terminal sigmoid colon demonstrates several fistula, 1 of which communicates with a developing gas collection within the anterior pelvis (69/2) this gas collection appears to be within the area of the previously noted drainage catheter on 05/26/2024 and measures roughly 5.8 x 5.2 x 10.7 cm in dimension (67/2, 19/5). New percutaneous drainage catheter is seen within the colonic mesentery with evacuation of the previously noted loculated fluid collection in this region. Several small adjacent loculated fluid collections are seen within the small bowel mesentery measuring to 21 mm and 8 mm (55/2) which do not clearly communicate with this drainage catheter. Additional 11 mm fluid collection within the right mid abdominal mesentery (50/2), and right lower quadrant mesentery measuring 2.6 cm (62/2). There is extensive infiltration of the mesentery in keeping with changes of peritonitis. No evidence of obstruction. No free intraperitoneal gas or fluid. Vascular/Lymphatic: Moderate aortoiliac atherosclerotic calcification. Inferior vena cava filter noted within the infrarenal cava. The abdominal vasculature is otherwise unremarkable. In particular, the superior and inferior mesenteric  veins and portal vein are patent. Reproductive: Status post hysterectomy. No adnexal masses. Other: No abdominal wall hernia. Moderate subcutaneous edema within the flanks bilaterally. Musculoskeletal: No acute bone abnormality. Osseous structures are age-appropriate IMPRESSION: 1. Interval percutaneous drainage catheter placement within the rectal vault via a left transgluteal route. Moderate debris persists surrounding the drainage catheter raising  the question of catheter occlusion. 2. Status post fall rectal anastomosis with dehiscence of the anastomosis. The dehiscent cavity appears to communicate with small bowel within the right lower quadrant. Additionally, the terminal sigmoid colon demonstrates several fistulae, 1 of which communicates with a developing non dependent gas collection within the anterior pelvis measuring roughly 5.8 x 5.2 x 10.7 cm in dimension. 3. New percutaneous drainage catheter within the colonic mesentery with evacuation of the previously noted loculated fluid collection in this region. Multiple additional small loculated fluid collections are seen within the small bowel mesentery measuring up to 21 mm are Jason 2 but do not communicate with this collection. 4. Additional small loculated fluid collections within the small bowel mesentery, as described above, without clear percutaneous route for access. 5. Extensive infiltration of the mesentery in keeping with changes of peritonitis. 6. Anasarca with small bilateral pleural effusions and subcutaneous body wall edema. Aortic Atherosclerosis (ICD10-I70.0). Electronically Signed   By: Dorethia Molt M.D.   On: 06/02/2024 20:18       Medications / Allergies: per chart  Antibiotics: Anti-infectives (From admission, onward)    Start     Dose/Rate Route Frequency Ordered Stop   06/03/24 1000  neomycin  (MYCIFRADIN ) tablet 500 mg  Status:  Discontinued        500 mg Oral 3 times daily 06/03/24 0935 06/03/24 1555   06/03/24 1000   metroNIDAZOLE  (FLAGYL ) tablet 500 mg  Status:  Discontinued       Note to Pharmacy: Take 2 pills (=1000mg ) by mouth at 1pm, 3pm, and 10pm the day before your colorectal operation   500 mg Oral 3 times daily 06/03/24 0935 06/03/24 1555   05/29/24 1500  DAPTOmycin  (CUBICIN ) IVPB 500 mg/50mL premix        8 mg/kg  60.9 kg 100 mL/hr over 30 Minutes Intravenous Daily 05/29/24 1359     05/28/24 2100  vancomycin  (VANCOREADY) IVPB 750 mg/150 mL  Status:  Discontinued        750 mg 150 mL/hr over 60 Minutes Intravenous Every 12 hours 05/28/24 1419 05/29/24 1359   05/27/24 0800  vancomycin  (VANCOCIN ) IVPB 1000 mg/200 mL premix  Status:  Discontinued        1,000 mg 200 mL/hr over 60 Minutes Intravenous Every 24 hours 05/26/24 1144 05/28/24 1419   05/23/24 1400  piperacillin -tazobactam (ZOSYN ) IVPB 3.375 g        3.375 g 12.5 mL/hr over 240 Minutes Intravenous Every 8 hours 05/23/24 0804     05/23/24 1200  micafungin  (MYCAMINE ) 100 mg in sodium chloride  0.9 % 100 mL IVPB        100 mg 105 mL/hr over 1 Hours Intravenous Every 24 hours 05/23/24 0755 06/06/24 1159   05/22/24 0800  vancomycin  (VANCOCIN ) IVPB 1000 mg/200 mL premix  Status:  Discontinued        1,000 mg 200 mL/hr over 60 Minutes Intravenous Every 24 hours 05/21/24 0634 05/26/24 0806   05/21/24 0730  vancomycin  (VANCOREADY) IVPB 1250 mg/250 mL        1,250 mg 166.7 mL/hr over 90 Minutes Intravenous  Once 05/21/24 0634 05/21/24 1043   05/19/24 1400  piperacillin -tazobactam (ZOSYN ) IVPB 3.375 g  Status:  Discontinued        3.375 g 12.5 mL/hr over 240 Minutes Intravenous Every 8 hours 05/19/24 1303 05/23/24 0804   05/09/24 0900  erythromycin  250 mg in sodium chloride  0.9 % 100 mL IVPB        250 mg 100 mL/hr  over 60 Minutes Intravenous Every 8 hours 05/09/24 0732 05/11/24 0044   05/08/24 2200  cefoTEtan  (CEFOTAN ) 2 g in sodium chloride  0.9 % 100 mL IVPB        2 g 200 mL/hr over 30 Minutes Intravenous Every 12 hours 05/08/24 1759  05/09/24 0749   05/08/24 1400  neomycin  (MYCIFRADIN ) tablet 1,000 mg  Status:  Discontinued       Placed in And Linked Group   1,000 mg Oral 3 times per day 05/08/24 1119 05/08/24 1120   05/08/24 1400  metroNIDAZOLE  (FLAGYL ) tablet 1,000 mg  Status:  Discontinued       Placed in And Linked Group   1,000 mg Oral 3 times per day 05/08/24 1119 05/08/24 1120   05/08/24 1130  cefoTEtan  (CEFOTAN ) 2 g in sodium chloride  0.9 % 100 mL IVPB        2 g 200 mL/hr over 30 Minutes Intravenous On call to O.R. 05/08/24 1119 05/09/24 0749         Note: Portions of this report may have been transcribed using voice recognition software. Every effort was made to ensure accuracy; however, inadvertent computerized transcription errors may be present.   Any transcriptional errors that result from this process are unintentional.    Elspeth KYM Schultze, MD, FACS, MASCRS Esophageal, Gastrointestinal & Colorectal Surgery Robotic and Minimally Invasive Surgery  [facility] [address] [place], [place] [address] [phone] Fax [phone] Main  CONTACT INFORMATION: Weekday (9AM-5PM): Call [facility] main office at [phone] Weeknight (5PM-9AM) or Weekend/Holiday: Check EPIC Web Links tab & use AMION (password  [email or web address]) for General Surgery [facility] coverage  Please, DO NOT use SecureChat  (it is not reliable communication to reach operating surgeons & will lead to a delay in care).   Epic staff messaging available for outptient concerns needing 1-2 business day response.      06/04/2024  6:52 AM

## 2024-06-04 NOTE — Consult Note (Addendum)
 WOC Nurse wound consult Note: Pt had surgery yesterday and a Vac dressing was applied to the midline full thickness post-op wound.  It is currently intact with a good seal at cont suction and small amt blood-tinged drainage. WOC team will plan to perform the first post-op dressing change tomorrow, and Q Mon/Thurs. Supplies ordered to the room.   WOC Nurse ostomy consult note Pt is familiar with ostomy care since she previously had an ostomy which was reversed.  She had another colostomy surgery performed yesterday and is critically ill on the ventilator. No family members at the bedside.  WOC team will perform first post-op pouch change tomorrow, along with the Vac dressing, since they are in close proximity and cannot be kept separate.  Stoma is red and viable when visualized through the pouch, which is intact with small amt pink drainage, no stool or flatus.  5 sets of supplies ordered to the bedside for staff nurses' use: barrier rings, Gerlean # [phone] and convex pouches, Gerlean # [phone]  Enrolled patient in [facility] DC program: NOT YET. Thank-you,  Stephane Fought MSN, RN, CWOCN, [place], CNS [phone]

## 2024-06-04 NOTE — Progress Notes (Signed)
 [facility]  Date of Admission:  05/08/2024   Total days of inpatient antibiotics 26  Principal Problem:   Diverticular disease Active Problems:   A-fib (HCC)   Restless leg   ABLA (acute blood loss anemia)   Need for emotional support   Acute urinary retention   Mixed hyperlipidemia   Essential hypertension   Diverticular disease of left colon   History of DVT (deep vein thrombosis)   History of pulmonary embolus (PE)   Pre-diabetes   Presence of IVC filter   Stricture of sigmoid colon (HCC)   Protein-calorie malnutrition, severe   Hypokalemia          Assessment:  83 year old female with past medical history of large bowel resection status post Hartmann resection and colostomy creation in October 2020 for complicated, anastomotic leak, A-fib not on anticoagulation, cervical cancer, syncopal episode admitted for colostomy takedown found to have: #Post op intra-abdominal abscesses, 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 Sheldon [facility] course was complicated by paroxysmal A-fib, acute blood loss requiring packed red blood cells.  Found to have postop intra-abdominal abscesses as well.  She initially had percutaneous drain placement with IR on 6/10, no cultures obtained.  Cultures from drain were polymicrobial on 6/12 growing Pseudomonas aeruginosa, MRSA, Candida albicans, E faecalis.  Patient has been on pip-tazo, micafungin , vancomycin . - She underwent a second set of drain placement on 6/17 as transgluteal drain became dislodged.  Cultures obtained from that day.  ID engaged for antibiotic recommendations. -Patient has been on daptomycin , pip-tazo and micafungin .  Repeat cultures from 6/17 grew Pseudomonas aeruginosa and Candida albicans. - Patient has CT abdomen pelvis on 6/23 which showed  questionable catheter occlusion of PERC drain via left transgluteal route given debris's persisted.  Several fistulous and sigmoid colon, one of them communicating with 5.8 X5.2X 10.7 gas collection, rectal anastomosis with dehiscence cavity communicating to small bowel within right lower quadrant.  Multiple additional small loculated fluid collection at new percutaneous drainage catheter site.  Additional small loculated fluid collection small bowel mesentery. - Taken to the OR on 6/24 for laparotomy with diverting loop transverse colostomy, I&D of abdominal abscess, gastrotomy tube placement, rectal tube placement.  Or findings noted very dense concreted intraperitoneal and pelvic adhesions.  Delayed ana anastomotic leak.  Near dissection colorectal anastomosis.  Rectal tube was placed through rectal stump to drain pelvis more directly.  Old drains removed. - Patient not extubated. Recommendations:  - Continue daptomycin , pip-tazo, micafungin .  -Given that patient multiple possible abscesses, extensive surgery.  Would recommend reimaging prior to stopping antibiotics. - General Surgery noted that most likely she will need a diversion to be able to heal, patient not willing to do this currently. -Contact precautions  Microbiology:   Antibiotics: Cefotan  10//20 Erythromycin  05/09/1930 Pip-tazo 6/9-present Vancomycin  6/12-present Micafungin  6/13-present  Lines: GERSHON Gostoro Colostomy JP abdominal Rectal  Cultures:  Blood  Urine  Other 6/17 pending 6/12 Pseudomonas aeruginosa, MRSA, Candida albicans, E faecalis     SUBJECTIVE: Resting in bed. Afebrile overnight.    Review of Systems: Review of Systems  All other systems reviewed and are negative.    Scheduled Meds:  Chlorhexidine  Gluconate Cloth  6 each Topical Daily   insulin  aspart  0-9 Units Subcutaneous Q6H   [START ON 06/05/2024] multivitamin  15 mL Oral Daily  mupirocin  ointment  1 Application Nasal BID   nystatin     Topical BID   mouth rinse  15 mL Mouth Rinse 4 times per day   pantoprazole  (PROTONIX ) IV  40 mg Intravenous Daily   sodium chloride  flush  10-40 mL Intracatheter Q12H   sodium chloride  flush  3 mL Intravenous Q12H   sodium chloride  flush  5 mL Intracatheter Q8H   sodium chloride  flush  5 mL Intracatheter Q8H   Continuous Infusions:  sodium chloride  10 mL/hr at 05/26/24 2028   DAPTOmycin      micafungin  (MYCAMINE ) 100 mg in sodium chloride  0.9 % 100 mL IVPB 100 mg (06/04/24 1246)   piperacillin -tazobactam (ZOSYN )  IV     TPN ADULT (ION) 70 mL/hr at 06/04/24 1226   TPN ADULT (ION)     PRN Meds:.sodium chloride , alum & mag hydroxide-simeth, fentaNYL  (SUBLIMAZE ) injection, hydrALAZINE , lip balm, menthol -cetylpyridinium, metoprolol  tartrate, naphazoline-glycerin , [DISCONTINUED] ondansetron  **OR** ondansetron  (ZOFRAN ) IV, mouth rinse, phenol, [DISCONTINUED] prochlorperazine  **OR** prochlorperazine , simethicone , sodium chloride , sodium chloride  flush, sodium chloride  flush, witch hazel-glycerin  Allergies  Allergen Reactions   Elemental Sulfur Anaphylaxis    OBJECTIVE: Vitals:   06/04/24 1000 06/04/24 1100 06/04/24 1200 06/04/24 1300  BP:      Pulse: (!) 59 (!) 110 (!) 109 (!) 106  Resp: (!) 24 (!) 25 (!) 24 (!) 25  Temp:   100.2 F (37.9 C)   TempSrc:   Axillary   SpO2: 99% 98% 98% 99%  Weight:      Height:       Body mass index is 23.33 kg/m.  Physical Exam Constitutional:      Appearance: Normal appearance.  HENT:     Head: Normocephalic and atraumatic.     Right Ear: Tympanic membrane normal.     Left Ear: Tympanic membrane normal.     Nose: Nose normal.     Mouth/Throat:     Mouth: Mucous membranes are moist.   Eyes:     Extraocular Movements: Extraocular movements intact.     Conjunctiva/sclera: Conjunctivae normal.     Pupils: Pupils are equal, round, and reactive to light.    Cardiovascular:     Rate and Rhythm: Normal rate and regular rhythm.     Heart  sounds: No murmur heard.    No friction rub. No gallop.  Pulmonary:     Effort: Pulmonary effort is normal.     Breath sounds: Normal breath sounds.  Abdominal:     Comments: Post op wounds, drains   Skin:    General: Skin is warm and dry.   Neurological:     General: No focal deficit present.     Mental Status: She is alert and oriented to person, place, and time.   Psychiatric:        Mood and Affect: Mood normal.       Lab Results Lab Results  Component Value Date   WBC 9.0 06/02/2024   HGB 9.0 (L) 06/02/2024   HCT 29.8 (L) 06/02/2024   MCV 92.3 06/02/2024   PLT 424 (H) 06/02/2024    Lab Results  Component Value Date   CREATININE 0.77 06/04/2024   BUN 23 06/04/2024   NA 132 (L) 06/04/2024   K 3.9 06/04/2024   CL 101 06/04/2024   CO2 22 06/04/2024    Lab Results  Component Value Date   ALT 23 06/04/2024   AST 34 06/04/2024   ALKPHOS 129 (H) 06/04/2024   BILITOT 1.2 06/04/2024  Loney Stank, MD [facility] 06/04/2024, 1:22 PM Evaluation of this patient requires complex antimicrobial therapy evaluation and counseling + isolation needs for disease transmission risk assessment and mitigation

## 2024-06-04 NOTE — Progress Notes (Signed)
 PHARMACY - ANTICOAGULATION CONSULT NOTE  Pharmacy Consult for heparin  Indication: atrial fibrillation (apixaban  on hold)  Allergies  Allergen Reactions   Elemental Sulfur Anaphylaxis    Patient Measurements: Height: 5' 5 (165.1 cm) Weight: 63.6 kg (140 lb 3.4 oz) IBW/kg (Calculated) : 57 HEPARIN  DW (KG): 61.3  Vital Signs: Temp: 100.9 F (38.3 C) (06/25 0745) Temp Source: Axillary (06/25 0745) Pulse Rate: 68 (06/25 0939)  Labs: Recent Labs    06/02/24 0255 06/04/24 0609  HGB 9.0*  --   HCT 29.8*  --   PLT 424*  --   CREATININE 0.71 0.77    Estimated Creatinine Clearance: 47.9 mL/min (by C-G formula based on SCr of 0.77 mg/dL).   Medical History: Past Medical History:  Diagnosis Date   A-fib (HCC) 04/09/2016   Acute head injury 04/09/2016   Anemia    Blood dyscrasia    Hx DVT / PE   Cervical cancer (HCC)    Cervical spine fracture, initial encounter 04/09/2016   Claustrophobia    extremely claustrophobic   Dupuytren disease of palm of both hands    Essential hypertension    Fatty liver    History of kidney stones    MCI (mild cognitive impairment) with memory loss    Multiple fractures of cervical spine (HCC) 04/09/2016   Restless leg    Syncope and collapse 04/09/2016    Medications: Pt was not on anticoagulants PTA, however, apixaban  was started during admission for atrial fibrillation.  -Apixaban  2.5 mg PO BID, last dose given 6/23 @ 21:30  Assessment: Pt is an 66 yoF who was started on apixaban  during this admission for atrial fibrillation. Pt underwent surgery on 6/24 - placement of G-tube, I&D of abscess, diverting colostomy. [facility] recommending heparin  drip post-op until pt having BM.   Pharmacy consulted to dose heparin  for afib while apixaban  on hold. Baseline labs ordered STAT (heparin  level, aPTT, CBC).   Today, 06/04/24 CBC: Awaiting results Baseline heparin  level may potentially be falsely elevated due to recent DOAC. If that is the case,  will monitor using aPTT until aPTT and heparin  level correlate in range.   Goal of Therapy:  Heparin  level 0.3-0.7 units/ml aPTT 66-102 seconds Monitor platelets by anticoagulation protocol: Yes   Plan: Earlier in admission, pt experienced hematochezia requiring transfusion and apixaban  to be held. CBC ordered to ensure stable post-op prior to initiation of heparin  drip. Will plan to start heparin  as below pending CBC result.  No bolus since pt is POD#1 Initiate heparin  infusion at 900 units/hr Check 8 hour aPTT/heparin  levels CBC, heparin  level, aPTT daily Monitor for signs of bleeding Follow for ability to resume apixaban    Ronal CHRISTELLA Rav, PharmD, BCPS 06/04/2024,10:59 AM

## 2024-06-04 NOTE — Progress Notes (Signed)
 PHARMACY - TOTAL PARENTERAL NUTRITION CONSULT NOTE   Indication: Prolonged ileus  Patient Measurements: Height: 5' 5 (165.1 cm) Weight: 63.6 kg (140 lb 3.4 oz) IBW/kg (Calculated) : 57 TPN AdjBW (KG): 61.3 Body mass index is 23.33 kg/m.  Assessment: 21 yoF with history of diverticulitis with perforation and abscess. 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 She requested ostomy takedown and underwent LOA, colostomy takedown, small bowel resection with anastomosis on 5/29. Pharmacy is consulted to dose TPN starting 6/2 for postop ileus.  Post-op course complicated by anastomotic leak, multiple-intraabdominal abscesses.  Glucose / Insulin : no Hx DM, A1c 5.1%. BG goal 100-150. -CBG/SSI discontinued on 6/11 -BG 152-160 slightly above goal Electrolytes: Na (132) slightly low but stable on max Na concentration -All others WNL, including CorrCa (9.5) Hepatic: LFTs, Tbili WNL -Albumin  low. Alk Phos trending down slowly -TG 150, was on propofol  while intubated. Extubated this AM I/O: no mIVF -UOP: 630 mL, NG: 100 mL, drain: 150 mL, stool: none -No PO intake/24 hrs. NPO pre-op GI Imaging:  -6/16 CT: Similar appearance at the rectal anastomosis concerning for dehiscence -6/9 CT: Dempsey dehiscence along the rectal anastomosis, free air and multiple fluid collections GI Surgeries / Procedures: -5/29: LAR, SBR, colostomy takedown, LOA -6/10 drains placed x 2 in IR (midline abd, transgluteal) -6/17: Replacement of drainage catheter by IR -6/24: Colostomy, G-tube placement, I&D abscess, ex lap  Central access: Double lumen PICC placed 6/2 TPN start date: 6/2  Nutritional Goals: Goal TPN rate is 70 mL/hr (provides 94 g of protein and 1680 kcals per day)  RD Assessment:  Estimated Needs Total Energy Estimated Needs: 1600-1750 kcals Total Protein Estimated Needs: 80-95  grams Total Fluid Estimated Needs: >/= 1.6L  Current Nutrition:  -TPN at goal rate -Diet: NPO except for ice chips  Multiple calorie counts this admission. Not meeting caloric needs. Refusing all supplements. Pt underwent G-tube placement 6/24.   Plan:  Now: Resume q6h CBG check and sSSI  Mg 2 g IV once per MD  At 1800: Continue TPN at goal rate of 70 mL/hr Electrolytes in TPN: No change Na 150 mEq/L (max) K 50 mEq/L Ca 5 mEq/L Mg 5 mEq/L Phos 15 mmol/L Cl:Ac 1:1 Continue sSSI q6h MVI PO Monitor TPN labs on Mon/Thurs, and as needed.  Ronal CHRISTELLA Rav, PharmD, BCPS Clinical Pharmacist 06/04/2024 10:16 AM

## 2024-06-04 NOTE — TOC Progression Note (Signed)
 Transition of Care [facility]) - Progression Note    Patient Details  Name: AHRIANNA SIGLIN MRN: [id number] Date of Birth: [birth year]  [facility] of [facility]) CM/SW Contact  Jon ONEIDA Anon, RN Phone Number: 06/04/2024, 3:45 PM  Clinical Narrative:    Pt extubated, off ventilator today. Pt is POD# 1. Pt currently requiring TPN. Not medically ready for discharge. TOC continuing to follow.    Expected Discharge Plan: Home/Self Care Barriers to Discharge: Continued Medical Work up  Expected Discharge Plan and Services   Discharge Planning Services: CM Consult   Living arrangements for the past 2 months: Apartment                                       Social Determinants of Health (SDOH) Interventions SDOH Screenings   Food Insecurity: No Food Insecurity (05/09/2024)  Housing: Low Risk  (05/09/2024)  Transportation Needs: No Transportation Needs (05/09/2024)  Utilities: Not At Risk (05/09/2024)  Depression (PHQ2-9): Low Risk  (01/02/2024)  Social Connections: Unknown (05/09/2024)  Tobacco Use: Low Risk  (05/08/2024)    Readmission Risk Interventions    05/12/2024    2:23 PM 05/09/2024    2:46 PM 10/19/2023    6:13 PM  Readmission Risk Prevention Plan  Transportation Screening Complete Complete Complete  PCP or Specialist Appt within 5-7 Days   Complete  PCP or Specialist Appt within 3-5 Days Complete Complete   Home Care Screening   Complete  Medication Review (RN CM)   Complete  HRI or Home Care Consult Complete Complete   Social Work Consult for Recovery Care Planning/Counseling Complete Complete   Palliative Care Screening Not Applicable Not Applicable   Medication Review Oceanographer) Complete Complete

## 2024-06-04 NOTE — Procedures (Signed)
 Extubation Procedure Note  Patient Details:   Name: Elizabeth Odom DOB: 1941-07-22 MRN: [id number]   Airway Documentation:    Vent end date: 06/04/24 Vent end time: 0939   Evaluation  O2 sats: stable throughout Complications: No apparent complications Patient did tolerate procedure well. Bilateral Breath Sounds: Clear, Diminished   Yes Patient tolerated wean. MD ordered to extubate. Positive for cuff leak. Patient extubated to a 4Lpm [place]. No signs of dyspnea or stridor noted. Patient resting comfortably. RN at bedside.   Estell Sailors 06/04/2024, 9:47 AM

## 2024-06-04 NOTE — Anesthesia Postprocedure Evaluation (Signed)
 Anesthesia Post Note  Patient: Elizabeth Odom  Procedure(s) Performed: CREATION, GASTROSTOMY, LAPAROSCOPIC CREATION, COLOSTOMY, DIVERTING, LAPAROSCOPIC (Abdomen) LAPAROSCOPY, DIAGNOSTIC (Abdomen) LAPAROTOMY, EXPLORATORY (Abdomen) INCISION AND DRAINAGE, ABSCESS (Abdomen)     Patient location during evaluation: SICU Anesthesia Type: General Level of consciousness: sedated Pain management: pain level controlled Vital Signs Assessment: post-procedure vital signs reviewed and stable Respiratory status: patient remains intubated per anesthesia plan Cardiovascular status: stable Postop Assessment: no apparent nausea or vomiting Anesthetic complications: no  No notable events documented.  Last Vitals:  Vitals:   06/04/24 0745 06/04/24 0939  BP:    Pulse: (!) 56 68  Resp: (!) 25 (!) 26  Temp: (!) 38.3 C   SpO2: 98% 100%    Last Pain:  Vitals:   06/04/24 0745  TempSrc: Axillary  PainSc:                  Elizabeth Odom

## 2024-06-04 NOTE — Progress Notes (Signed)
 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 Hospital course was complicated by paroxysmal A-fib, acute blood loss requiring packed red blood cells, and postop intra-abdominal abscesses as well.  She initially had percutaneous drain placement on 6/10. She underwent a second set of drain placement on 6/17 as transgluteal drain became dislodged. Patient had a CT abdomen pelvis on 6/23 which showed questionable catheter occlusion  gas collection, rectal anastomosis with dehiscence. She was taken to the OR on 6/24 for laparotomy with diverting loop transverse colostomy, I&D of abdominal abscess, gastrotomy tube placement, and rectal tube. PMH:  hypertension, history of partial resection of the colon with colostomy, atrial fibrillation, DVT and small PE, hyperlipidemia.   OT comments  The pt was agreeable to therapy participation though needing some reinforcement encouragement. Pt required max assist x2 to perform supine to sit. She tolerated sitting edge of bed for several minutes, initially requiring max assist to maintain sitting balance. She progressed to needing mod assist for sitting. She presented with slower, guarded movements, due to reports of significant 8/10 abdominal pain. Out of bed attempts were deferred, due to pt's decreased activity tolerance and pain. She was assisted back to semi-fowler's at the end  of the session. Continue OT plan of care. Patient will benefit from continued inpatient follow up therapy, <3 hours/day.      If plan is discharge home, recommend the following:  A lot of help with  bathing/dressing/bathroom;Assistance with cooking/housework;Direct supervision/assist for medications management;Assist for transportation;Direct supervision/assist for financial management;Help with stairs or ramp for entrance;Two people to help with walking and/or transfers   Equipment Recommendations  Other (comment) (defer to next level of care)    Recommendations for Other Services      Precautions / Restrictions Precautions Precautions: Fall Precaution/Restrictions Comments: ABD surgery,  abdominal JP Restrictions Weight Bearing Restrictions Per Provider Order: No       Mobility Bed Mobility Overal bed mobility: Needs Assistance Bed Mobility: Supine to Sit, Sit to Supine     Supine to sit: Max assist, +2 for physical assistance Sit to supine: Max assist, +2 for physical assistance   General bed mobility comments: pt required increased cues for general transfer technique, including advancing B LE and using trunk positioning       Balance     Sitting balance-Leahy Scale: Poor              ADL either performed or assessed with clinical judgement   ADL Overall ADL's : Needs assistance/impaired                 Upper Body Dressing : Maximal assistance;Bed level Upper Body Dressing Details (indicate cue type and reason): for gown management in bed Lower Body Dressing: Total assistance;Bed level Lower Body Dressing Details (indicate cue type and reason): to donn socks in bed                              Communication Communication Communication: No apparent difficulties   Cognition Arousal:  (mildly lethargic) Behavior During Therapy: Flat affect Cognition: Difficult to assess          Following commands impaired: Follows one  step commands with increased time (and occasional repetition of prompts)                    Pertinent Vitals/ Pain       Pain Assessment Pain Assessment: 0-10 Pain Score: 8  Pain Location: abdomen Pain  Intervention(s): Limited activity within patient's tolerance, Monitored during session, Repositioned   Frequency  Min 2X/week        Progress Toward Goals  OT Goals(current goals can now be found in the care plan section)  Progress towards OT goals: Progressing toward goals  Acute Rehab OT Goals OT Goal Formulation: With patient Time For Goal Achievement: 05/25/24 Potential to Achieve Goals: Fair  Plan         AM-PAC OT 6 Clicks Daily Activity     Outcome Measure   Help from another person eating meals?: Total Help from another person taking care of personal grooming?: A Lot Help from another person toileting, which includes using toliet, bedpan, or urinal?: Total Help from another person bathing (including washing, rinsing, drying)?: A Lot Help from another person to put on and taking off regular upper body clothing?: A Lot Help from another person to put on and taking off regular lower body clothing?: Total 6 Click Score: 9    End of Session Equipment Utilized During Treatment: Oxygen  OT Visit Diagnosis: Pain;Muscle weakness (generalized) (M62.81)   Activity Tolerance Patient limited by pain   Patient Left in bed;with call bell/phone within reach;with bed alarm set   Nurse Communication Other (comment) (nurse cleared the pt for therapy participation)        Time: 1420-1436 OT Time Calculation (min): 16 min  Charges: OT General Charges $OT Visit: 1 Visit OT Treatments $Therapeutic Activity: 8-22 mins      [name], OTR/L 06/04/2024, 3:52 PM

## 2024-06-04 NOTE — Progress Notes (Signed)
 Patient doing well post-extubation. Have signed out to [facility] to resume care for medical management 6/26.

## 2024-06-04 NOTE — Plan of Care (Signed)
  Problem: Education: Goal: Verbalization of understanding of the causes of altered bowel function will improve Outcome: Not Progressing   Problem: Bowel/Gastric: Goal: Gastrointestinal status for postoperative course will improve Outcome: Not Progressing   Problem: Nutritional: Goal: Will attain and maintain optimal nutritional status will improve Outcome: Not Progressing   Problem: Clinical Measurements: Goal: Postoperative complications will be avoided or minimized Outcome: Not Progressing   Problem: Respiratory: Goal: Respiratory status will improve Outcome: Progressing

## 2024-06-04 NOTE — Plan of Care (Signed)
  Problem: Education: Goal: Verbalization of understanding of the causes of altered bowel function will improve Outcome: Progressing   Problem: Bowel/Gastric: Goal: Gastrointestinal status for postoperative course will improve Outcome: Progressing   Problem: Health Behavior/Discharge Planning: Goal: Identification of community resources to assist with postoperative recovery needs will improve Outcome: Progressing   Problem: Nutritional: Goal: Will attain and maintain optimal nutritional status will improve Outcome: Progressing

## 2024-06-04 NOTE — Progress Notes (Signed)
 Pharmacy Brief Note - Evening Anticoagulation Follow Up:  Patient is a 83 yo female ordered IV heparin  infusion for atrial fibrillation (apixaban  on hold).   For full note/history/plan, see note by Ronal Rav, PharmD from earlier today.  Awaiting CBC ordered today prior to initiation of IV heparin .  Assessment: CBC resulted including hemoglobin = 7.2 (down) and platelets WNL  Goal: Heparin  level 0.3-0.7 units/ml aPTT 66-102 seconds  Plan: Per Critical Care Nurse Practitioner, ok to proceed with IV heparin  as ordered and using no heparin  bolus.    Thank you for allowing pharmacy to be a part of this patient's care.  Eleanor EMERSON Agent, PharmD, BCPS Clinical Pharmacist [place] 06/04/2024 4:27 PM

## 2024-06-04 NOTE — Progress Notes (Signed)
 NAME:  Elizabeth Odom, MRN:  [id number], DOB:  09-11-1941, LOS: 27 ADMISSION DATE:  05/08/2024, CONSULTATION DATE:  06/04/2024 REFERRING MD:  [name], MD, CHIEF COMPLAINT:  post-operative vent management   History of Present Illness:  Elizabeth Odom is a 83 y.o. woman who presented last fall for a large bowel obstruction and was found to have colonic resection and colostomy creation.  Her hospital course was complicated by anastomotic leak.  She presented last month for a colostomy resection and takedown.  Her clinical course was complicated by dense adhesions.  Postoperatively she experienced atrial fibrillation with RVR, polypharmacy and altered mental status, acute blood loss anemia, acute urinary retention.  She had intra-abdominal abscess with polymicrobial growth positive for Pseudomonas aeruginosa, MRSA, Candida, E faecalis.  She has been in the hospital for the last month.  She suffered poor p.o. intake and has been on TPN.  She went back to the OR today with Dr. Sheldon for lysis of adhesions, colostomy placement, drainage of abscess.  Ultimately a rectal tube was placed to drain her abscess collection from below.  This should be removed at some point.  Her colostomy was placed.  G-tube was placed for nutrition.  Intraoperatively she had episode of hypotension requiring vasopressors immediately upon insufflation to the abdomen.  This has since resolved.  She returned to the ICU intubated.  I discussed her care with Dr. Sheldon.  Pertinent  Medical History  HTN  Significant Hospital Events: Including procedures, antibiotic start and stop dates in addition to other pertinent events   5/29 presented for elective colostomy takedown per Dr. Sheldon  6/10 Transgluteal drain placed per IR 6/17 CT guided drain placed into the abdominal and pelvic abscesses yielding pus and stool.   6/24 Back to the OR with Dr. Sheldon for lysis of adhesions, colostomy placement, drainage of abscess, and placement of  G-tube, Intraoperatively she had episode of hypotension requiring vasopressors immediately upon insufflation to the abdomen. 6/25 tolerating SBT this a.m. hopeful for extubation  Interim History / Subjective:  Opens eyes and able to follow simple commands on vent this a.m.  Objective    Blood pressure (!) 93/53, pulse (!) 56, temperature 98.6 F (37 C), temperature source Axillary, resp. rate (!) 25, height 5' 5 (1.651 m), weight 63.6 kg, SpO2 98%.    Vent Mode: CPAP;PSV FiO2 (%):  [40 %-100 %] 40 % Set Rate:  [20 bmp] 20 bmp Vt Set:  [450 mL] 450 mL PEEP:  [5 cmH20] 5 cmH20 Pressure Support:  [5 cmH20] 5 cmH20 Plateau Pressure:  [12 cmH20-16 cmH20] 12 cmH20   Intake/Output Summary (Last 24 hours) at 06/04/2024 0824 Last data filed at 06/04/2024 0554 Gross per 24 hour  Intake 3182.4 ml  Output 905 ml  Net 2277.4 ml   Filed Weights   05/31/24 0500 06/03/24 0423 06/04/24 0500  Weight: 58.8 kg 61 kg 63.6 kg    Examination: General: Acute on chronic significantly deconditioned plan elderly female lying in bed on mechanical ventilation no acute distress HEENT: ETT, MM pink/moist, PERRL,  Neuro: Opens eyes and able to follow simple commands on ventilator this a.m. CV: s1s2 regular rate and rhythm, no murmur, rubs, or gallops,  PULM: Clear to auscultation bilaterally, no increased work of breathing, breath sounds GI: soft, bowel sounds hypoactive in all 4 quadrants, non-tender, non-distended, right upper colostomy, JP drain,  tolerating TPN Extremities: warm/dry, no edema  Skin: no rashes or lesions  Resolved problem list   Assessment  and Plan   Post-procedural vent management Sedation for mechanical ventilation P: Tolerating SBT if able to awake more this a.m.  Can likely tolerate extubation Continue ventilator support with lung protective strategies  Wean PEEP and FiO2 for sats greater than 90%. Head of bed elevated 30 degrees. Plateau pressures less than 30 cm H20.   Follow intermittent chest x-ray and ABG.   SAT/SBT as tolerated, mentation preclude extubation  Ensure adequate pulmonary hygiene  Follow cultures  VAP bundle in place  PAD protocol  Complex surgical abdomen POD 0 ex-lap for lysis of adhesions Diverting Transverse loop colostomy G tube placement Rectal tube placement (for abscess drainage) P: Management per surgery Continue TPN per surgery Wound VAC per surgery  Intra-abdominal abscess P: ID following, appreciate assistance Currently remains on daptomycin , Zosyn , and micafungin  Follow cultures   Atrial Fibrillation P: Discussed with care team resumption of anticoagulation per surgery and show acute GI tract capillary medications manage consider IV heparin  drip As needed IV Lopressor  for rate control  Severe protein calorie malnutrition P: Continue TPN advance to tube feeds per surgery   Best Practice (right click and Reselect all SmartList Selections daily)   Diet/type: NPO on TPN. Per surgery to taper off and start TF maybe tomorrow.  DVT prophylaxis DOAC holding tonight, resume tomorrow Pressure ulcer(s): pressure ulcer assessment deferred  GI prophylaxis: H2B Lines: N/A Foley:  N/A Code Status:  full code Last date of multidisciplinary goals of care discussion [per primary]    Critical care time:     CRITICAL CARE Performed by: Blakely Gluth D. Harris   Total critical care time: 38 minutes  Critical care time was exclusive of separately billable procedures and treating other patients.  Critical care was necessary to treat or prevent imminent or life-threatening deterioration.  Critical care was time spent personally by me on the following activities: development of treatment plan with patient and/or surrogate as well as nursing, discussions with consultants, evaluation of patient's response to treatment, examination of patient, obtaining history from patient or surrogate, ordering and performing treatments  and interventions, ordering and review of laboratory studies, ordering and review of radiographic studies, pulse oximetry and re-evaluation of patient's condition.  Laddie Math D. Harris, NP-C [facility] Personal contact information can be found on Amion  If no contact or response made please call [phone] 06/04/2024, 8:35 AM

## 2024-06-05 DIAGNOSIS — K5790 Diverticulosis of intestine, part unspecified, without perforation or abscess without bleeding: Secondary | ICD-10-CM | POA: Diagnosis not present

## 2024-06-05 LAB — COMPREHENSIVE METABOLIC PANEL WITH GFR
ALT: 21 U/L (ref 0–44)
AST: 26 U/L (ref 15–41)
Albumin: 1.7 g/dL — ABNORMAL LOW (ref 3.5–5.0)
Alkaline Phosphatase: 146 U/L — ABNORMAL HIGH (ref 38–126)
Anion gap: 9 (ref 5–15)
BUN: 21 mg/dL (ref 8–23)
CO2: 22 mmol/L (ref 22–32)
Calcium: 8 mg/dL — ABNORMAL LOW (ref 8.9–10.3)
Chloride: 107 mmol/L (ref 98–111)
Creatinine, Ser: 0.55 mg/dL (ref 0.44–1.00)
GFR, Estimated: >60 mL/min (ref >60)
Glucose, Bld: 140 mg/dL — ABNORMAL HIGH (ref 70–99)
Potassium: 4 mmol/L (ref 3.5–5.1)
Sodium: 138 mmol/L (ref 135–145)
Total Bilirubin: 1.3 mg/dL — ABNORMAL HIGH (ref 0.0–1.2)
Total Protein: 6.4 g/dL — ABNORMAL LOW (ref 6.5–8.1)

## 2024-06-05 LAB — CBC
HCT: 23.3 % — ABNORMAL LOW (ref 36.0–46.0)
Hemoglobin: 7.1 g/dL — ABNORMAL LOW (ref 12.0–15.0)
MCH: 29.1 pg (ref 26.0–34.0)
MCHC: 30.5 g/dL (ref 30.0–36.0)
MCV: 95.5 fL (ref 80.0–100.0)
Platelets: 361 10*3/uL (ref 150–400)
RBC: 2.44 MIL/uL — ABNORMAL LOW (ref 3.87–5.11)
RDW: 16.9 % — ABNORMAL HIGH (ref 11.5–15.5)
WBC: 13.5 10*3/uL — ABNORMAL HIGH (ref 4.0–10.5)
nRBC: 0 % (ref 0.0–0.2)

## 2024-06-05 LAB — PHOSPHORUS: Phosphorus: 1.9 mg/dL — ABNORMAL LOW (ref 2.5–4.6)

## 2024-06-05 LAB — MAGNESIUM: Magnesium: 1.9 mg/dL (ref 1.7–2.4)

## 2024-06-05 LAB — GLUCOSE, CAPILLARY
Glucose-Capillary: 112 mg/dL — ABNORMAL HIGH (ref 70–99)
Glucose-Capillary: 124 mg/dL — ABNORMAL HIGH (ref 70–99)
Glucose-Capillary: 146 mg/dL — ABNORMAL HIGH (ref 70–99)
Glucose-Capillary: 152 mg/dL — ABNORMAL HIGH (ref 70–99)

## 2024-06-05 LAB — PREPARE RBC (CROSSMATCH)

## 2024-06-05 LAB — APTT: aPTT: 40 s — ABNORMAL HIGH (ref 24–36)

## 2024-06-05 LAB — HEPARIN LEVEL (UNFRACTIONATED)
Heparin Unfractionated: 0.19 [IU]/mL — ABNORMAL LOW (ref 0.30–0.70)
Heparin Unfractionated: 0.16 [IU]/mL — ABNORMAL LOW (ref 0.30–0.70)

## 2024-06-05 MED ORDER — SODIUM CHLORIDE 0.9 % IV SOLN
200.0000 mg | INTRAVENOUS | Status: DC
  Administered 2024-06-05 – 2024-06-19 (×3): 200 mg via INTRAVENOUS
  Filled 2024-06-05: qty 200
  Filled 2024-06-05 (×3): qty 10

## 2024-06-05 MED ORDER — VITAL HIGH PROTEIN PO LIQD
1000.0000 mL | ORAL | Status: DC
  Filled 2024-06-05 (×2): qty 1000

## 2024-06-05 MED ORDER — SODIUM CHLORIDE 0.9% IV SOLUTION: Freq: Once | INTRAVENOUS | Status: AC

## 2024-06-05 MED ORDER — TRAVASOL 10 % IV SOLN
INTRAVENOUS | Status: AC
  Filled 2024-06-05: qty 940.8

## 2024-06-05 MED ORDER — SODIUM CHLORIDE 0.9 % IV SOLN
100.0000 mg | INTRAVENOUS | Status: DC
  Administered 2024-06-06 – 2024-06-11 (×6): 100 mg via INTRAVENOUS
  Filled 2024-06-05 (×7): qty 5

## 2024-06-05 MED ORDER — POTASSIUM PHOSPHATES 15 MMOLE/5ML IV SOLN
20.0000 mmol | Freq: Once | INTRAVENOUS | Status: AC
  Administered 2024-06-05: 20 mmol via INTRAVENOUS
  Filled 2024-06-05 (×2): qty 6.67

## 2024-06-05 NOTE — Progress Notes (Signed)
 Physical Therapy Treatment Patient Details Name: Elizabeth Odom MRN: [id number] DOB: 10-Jan-1941 Today's Date: 06/05/2024   History of Present Illness 83 y.o. female s/p rectosigmoid resection takedown on 5/29. Course complicated by A-fib with RVR and then hypotension/rectal bleeding and syncope on 05/11/2024.  Patient  had postop ileus needing NG tube and TPN.  Imaging on 05/19/2024 showed possible abscesses. S/P IR guided drains placed on 05/20/2024.  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    PT Comments  Pt in bed c/o thirst, reports pain has improved since initial check in at 1400. Pt preparing for blood transfusion, OOB activity not indicated at this time, pt and family receptive to developing exercise program based on pt tolerance at this time, nursing in agreement. Family encouraged and agreeable to assist with compliance of completion q2 hours during the day to maintain joint mobility in BLE and promote blood flow to distal regions. Pt able to complete with some lethargy requiring inc cues. PT to continue to follow up progressing as tolerated.    If plan is discharge home, recommend the following: Two people to help with walking and/or transfers;Two people to help with bathing/dressing/bathroom;Assistance with cooking/housework;Help with stairs or ramp for entrance;Assist for transportation   Can travel by private vehicle     No  Equipment Recommendations  None recommended by PT    Recommendations for Other Services       Precautions / Restrictions Precautions Precautions: Fall Recall of Precautions/Restrictions: Impaired Precaution/Restrictions Comments: ABD surgery,  abdominal JP Restrictions Weight Bearing Restrictions Per Provider Order: No     Mobility   Bed Mobility               General bed mobility comments: Unable to progress bed mobility due to pain and blood transfusion. Pt family and nursing agreeable to pt exercise program today and follow up as indicated next day    Transfers                        Ambulation/Gait                   Stairs             Wheelchair Mobility     Tilt Bed    Modified Rankin (Stroke Patients Only)       Balance                                            Communication Communication Communication: No apparent difficulties  Cognition Arousal: Lethargic Behavior During Therapy: Flat affect   PT - Cognitive impairments: Initiation, Attention, Sequencing                       PT - Cognition Comments: frequent cues during exercise repetitions Following commands: Impaired Following commands impaired: Follows one step commands with increased time    Cueing Cueing Techniques: Verbal cues, Tactile cues, Gestural cues  Exercises Total Joint Exercises Ankle Circles/Pumps: AROM, Both, 15 reps Quad Sets: 10 reps, Both    General Comments        Pertinent Vitals/Pain Pain Assessment Pain Assessment: Faces Faces Pain Scale: Hurts whole  lot Pain Location: abdomen Pain Descriptors / Indicators: Aching, Moaning, Grimacing, Discomfort Pain Intervention(s): Limited activity within patient's tolerance, Monitored during session, Premedicated before session    Home Living                          Prior Function            PT Goals (current goals can now be found in the care plan section) Acute Rehab PT Goals Patient Stated Goal: I just want to go home PT Goal Formulation: With patient Time For Goal Achievement: 06/05/24 Potential to Achieve Goals: Fair Progress towards PT goals: Progressing toward goals    Frequency    Min 2X/week      PT Plan      Co-evaluation              AM-PAC PT 6 Clicks  Mobility   Outcome Measure  Help needed turning from your back to your side while in a flat bed without using bedrails?: A Lot Help needed moving from lying on your back to sitting on the side of a flat bed without using bedrails?: A Lot Help needed moving to and from a bed to a chair (including a wheelchair)?: A Lot Help needed standing up from a chair using your arms (e.g., wheelchair or bedside chair)?: Total Help needed to walk in hospital room?: Total Help needed climbing 3-5 steps with a railing? : Total 6 Click Score: 9    End of Session   Activity Tolerance: Treatment limited secondary to medical complications (Comment);Patient limited by pain (Pt reports inc pain and thirst, limited PO intake per SLP and med team, preparing blood transfusion) Patient left: in bed;with call bell/phone within reach;with bed alarm set;with nursing/sitter in room;with family/visitor present Nurse Communication: Other (comment) (thirst and pain) PT Visit Diagnosis: Muscle weakness (generalized) (M62.81);Difficulty in walking, not elsewhere classified (R26.2)     Time: 8561-8552 PT Time Calculation (min) (ACUTE ONLY): 9 min  Charges:    $Therapeutic Exercise: 8-22 mins PT General Charges $$ ACUTE PT VISIT: 1 Visit                     Elizabeth Odom, PT Acute Rehabilitation Services Office: [phone] 06/05/2024    Elizabeth Odom Elizabeth Odom 06/05/2024, 3:08 PM

## 2024-06-05 NOTE — Progress Notes (Signed)
 PHARMACY - TOTAL PARENTERAL NUTRITION CONSULT NOTE   Indication: Prolonged ileus  Patient Measurements: Height: 5' 5 (165.1 cm) Weight: 65.2 kg (143 lb 11.8 oz) IBW/kg (Calculated) : 57 TPN AdjBW (KG): 61.3 Body mass index is 23.92 kg/m.  Assessment: 44 yoF with history of diverticulitis with perforation and abscess. 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 She requested ostomy takedown and underwent LOA, colostomy takedown, small bowel resection with anastomosis on 5/29. Pharmacy is consulted to dose TPN starting 6/2 for postop ileus.  Post-op course complicated by anastomotic leak, multiple-intraabdominal abscesses.  Glucose / Insulin : no Hx DM, A1c 5.1%. BG goal <180 -CBG/SSI discontinued on 6/11, 6/25>> -BG 135-160  - SSI: 4 units/24h Electrolytes: Na up to 138 on max Na concentration -Phos 1.9 low-replace, CoCa 9.8  Hepatic: LFTs, Tbili up to 1.3 today -Albumin  low but stable. Alk Phos elevated -TG 150 (6/25) I/O: no mIVF -UOP: 650 mL, NG:0 mL, drain:60 mL, LBM 6/26, rectal tube out. -No PO intake/24 hrs. NPO pre-op  GI Imaging:  -6/16 CT: Similar appearance at the rectal anastomosis concerning for dehiscence -6/9 CT: Dempsey dehiscence along the rectal anastomosis, free air and multiple fluid collections - 6/23: poss catheter occlusion of PERC drain, several fistulous of sigmoid colon, gas collection, rectal anastomosis with dehiscence cavity communicating to small bowel, Multiple additional small loculated fluid collection at new percutaneous drainage catheter site. Additional small loculated fluid collection small bowel mesentery.  GI Surgeries / Procedures: -5/29: LAR, SBR, colostomy takedown, LOA -6/10 drains placed x 2 in IR (midline abd, transgluteal) -6/17: Replacement of drainage catheter by IR -6/24: diverting loop transverse colostomy, I&D of  abdominal abscess, gastrotomy tube placement, rectal tube placement.   Central access: Double lumen PICC placed 6/2 TPN start date: 6/2  Nutritional Goals: Goal TPN rate is 70 mL/hr (provides 94 g of protein and 1680 kcals per day)  RD Assessment:  Estimated Needs Total Energy Estimated Needs: 1600-1750 kcals Total Protein Estimated Needs: 80-95 grams Total Fluid Estimated Needs: >/= 1.6L  Current Nutrition:  -TPN at goal rate 31ml/hr  -Diet: NPO except for ice chips Multiple calorie counts this admission. Not meeting caloric needs. Refusing all supplements. Pt underwent G-tube placement 6/24.   Plan:  Start Vital HP 20ml/hr today. Kphos 20mmol IV x 1 Continue TPN at goal rate of 70 mL/hr Resume MV in TPN for now Electrolytes in TPN: No change Na 150 mEq/L (max) K 50 mEq/L Ca 5 mEq/L Mg 5 mEq/L Phos 15 mmol/L Cl:Ac 1:1 Resumed sSSI q6h MVI PO Monitor TPN labs on Mon/Thurs, and as needed.  Elizabeth Odom, PharmD, BCPS Clinical Pharmacist 06/05/2024 10:38 AM

## 2024-06-05 NOTE — Progress Notes (Addendum)
 PHARMACY - ANTICOAGULATION CONSULT NOTE  Pharmacy Consult for heparin  Indication: atrial fibrillation (apixaban  on hold)  Allergies  Allergen Reactions   Elemental Sulfur Anaphylaxis    Patient Measurements: Height: 5' 5 (165.1 cm) Weight: 63.6 kg (140 lb 3.4 oz) IBW/kg (Calculated) : 57 HEPARIN  DW (KG): 61.3  Vital Signs: Temp: 99.7 F (37.6 C) (06/26 0226) Temp Source: Oral (06/26 0226) BP: 144/64 (06/26 0226) Pulse Rate: 101 (06/26 0226)  Labs: Recent Labs    06/04/24 0609 06/04/24 1522 06/04/24 1523 06/04/24 1546 06/05/24 0429  HGB  --   --   --  7.2* 7.1*  HCT  --   --   --  23.8* 23.3*  PLT  --   --   --  370 361  APTT  --  41*  --   --  40*  HEPARINUNFRC  --   --  0.60  --  0.19*  CREATININE 0.77  --   --   --   --     Estimated Creatinine Clearance: 47.9 mL/min (by C-G formula based on SCr of 0.77 mg/dL).   Medical History: Past Medical History:  Diagnosis Date   A-fib (HCC) 04/09/2016   Acute head injury 04/09/2016   Anemia    Blood dyscrasia    Hx DVT / PE   Cervical cancer (HCC)    Cervical spine fracture, initial encounter 04/09/2016   Claustrophobia    extremely claustrophobic   Dupuytren disease of palm of both hands    Essential hypertension    Fatty liver    History of kidney stones    MCI (mild cognitive impairment) with memory loss    Multiple fractures of cervical spine (HCC) 04/09/2016   Restless leg    Syncope and collapse 04/09/2016    Medications: Pt was not on anticoagulants PTA, however, apixaban  was started during admission for atrial fibrillation.  -Apixaban  2.5 mg PO BID, last dose given 6/23 @ 21:30  Assessment: Pt is an 52 yoF who was started on apixaban  during this admission for atrial fibrillation. Pt underwent surgery on 6/24 - placement of G-tube, I&D of abscess, diverting colostomy. CCS recommending heparin  drip post-op until pt having BM.   Pharmacy consulted to dose heparin  for afib while apixaban  on hold.  Baseline labs ordered STAT (heparin  level, aPTT, CBC).   Today, 06/05/24 .aPTT 40 subtherapeutic on 900 units/hr HL 0.19 also subtherapeutic Hgb 7.1, plts 361 No bleeding noted per RN  Goal of Therapy:  Heparin  level 0.3-0.7 units/ml aPTT 66-102 seconds Monitor platelets by anticoagulation protocol: Yes   Plan:  Increase heparin  drip to 1100 units/hr aPTT and heparin  correlating, check heparin  level in 8 hours CBC, heparin  level, aPTT daily Monitor for signs of bleeding Follow for ability to resume apixaban   Leeroy Mace RPh 06/05/2024, 5:21 AM

## 2024-06-05 NOTE — Evaluation (Signed)
 Clinical/Bedside Swallow Evaluation Patient Details  Name: Elizabeth Odom MRN: [id number] Date of Birth: 10-Jan-1941  Today's Date: 06/05/2024 Time: SLP Start Time (ACUTE ONLY): 0750 SLP Stop Time (ACUTE ONLY): 9177 SLP Time Calculation (min) (ACUTE ONLY): 32 min  Past Medical History:  Past Medical History:  Diagnosis Date   A-fib (HCC) 04/09/2016   Acute head injury 04/09/2016   Anemia    Blood dyscrasia    Hx DVT / PE   Cervical cancer (HCC)    Cervical spine fracture, initial encounter 04/09/2016   Claustrophobia    extremely claustrophobic   Dupuytren disease of palm of both hands    Essential hypertension    Fatty liver    History of kidney stones    MCI (mild cognitive impairment) with memory loss    Multiple fractures of cervical spine (HCC) 04/09/2016   Restless leg    Syncope and collapse 04/09/2016   Past Surgical History:  Past Surgical History:  Procedure Laterality Date   APPENDECTOMY     BIOPSY  09/29/2023   Procedure: BIOPSY;  Surgeon: Saintclair Jasper, MD;  Location: [facility];  Service: Gastroenterology;;   breast lump removal Left    CERVICAL LAMINECTOMY     COLECTOMY WITH COLOSTOMY CREATION/HARTMANN PROCEDURE N/A 10/01/2023   Procedure: COLECTOMY WITH COLOSTOMY CREATION/HARTMANN PROCEDURE;  Surgeon: Rubin Calamity, MD;  Location: [facility];  Service: General;  Laterality: N/A;   complete hysterectomy     CYSTOSCOPY WITH INDOCYANINE GREEN  IMAGING (ICG) N/A 05/08/2024   Procedure: CYSTOSCOPY WITH INDOCYANINE GREEN  IMAGING (ICG);  Surgeon: Shane Steffan BROCKS, MD;  Location: [facility];  Service: Urology;  Laterality: N/A;   EP IMPLANTABLE DEVICE N/A 04/10/2016   Procedure: Loop Recorder Insertion;  Surgeon: Danelle LELON [place], MD;  Location: [facility];  Service: Cardiovascular;  Laterality: N/A;   FLEXIBLE SIGMOIDOSCOPY N/A 09/29/2023   Procedure: FLEXIBLE SIGMOIDOSCOPY;  Surgeon: Saintclair Jasper, MD;  Location: [facility];  Service: Gastroenterology;   Laterality: N/A;   FLEXIBLE SIGMOIDOSCOPY N/A 05/08/2024   Procedure: SIGMOIDOSCOPY, FLEXIBLE;  Surgeon: [name], MD;  Location: [facility];  Service: General;  Laterality: N/A;   HARVEST BONE GRAFT     INCISION AND DRAINAGE ABSCESS  06/03/2024   Procedure: INCISION AND DRAINAGE, ABSCESS;  Surgeon: [name], MD;  Location: [facility];  Service: General;;  Abdominal Abscess   IR IVC FILTER PLMT / S&I /IMG GUID/MOD SED  10/05/2023   IR SINUS/FIST TUBE CHK-NON GI  10/22/2023   IR SINUS/FIST TUBE CHK-NON GI  11/05/2023   LAPAROSCOPIC DIVERTED COLOSTOMY  06/03/2024   Procedure: CREATION, COLOSTOMY, DIVERTING, LAPAROSCOPIC;  Surgeon: [name], MD;  Location: [facility];  Service: General;;  Laparoscopy converted to Open   LAPAROSCOPIC GASTROSTOMY N/A 06/03/2024   Procedure: CREATION, GASTROSTOMY, LAPAROSCOPIC;  Surgeon: [name], MD;  Location: [facility];  Service: General;  Laterality: N/A;  FEEDING GASROSTOMY TUBE   LAPAROSCOPIC LYSIS OF ADHESIONS N/A 05/08/2024   Procedure: LYSIS, ADHESIONS, LAPAROSCOPIC;  Surgeon: [name], MD;  Location: [facility];  Service: General;  Laterality: N/A;  LYSIS OF ADHESIONS   LAPAROSCOPY  06/03/2024   Procedure: LAPAROSCOPY, DIAGNOSTIC;  Surgeon: [name], MD;  Location: [facility];  Service: General;;   LAPAROTOMY N/A 06/03/2024   Procedure: LAPAROTOMY, EXPLORATORY;  Surgeon: [name], MD;  Location: [facility];  Service: General;  Laterality: N/A;   LOOP RECORDER REMOVAL N/A 07/08/2018   Procedure: LOOP RECORDER REMOVAL;  Surgeon: [place] Danelle LELON, MD;  [facility]: [facility];  Service: Cardiovascular;  Laterality: N/A;   XI ROBOTIC ASSISTED COLOSTOMY TAKEDOWN N/A 05/08/2024   Procedure: CLOSURE, COLOSTOMY, ROBOT-ASSISTED -ROBOTIC RECTOSIGMOID RESECTION (LAR) -TAKEDOWN OF END COLOSTOMY WITH ANASTOMOSIS -RESECTION OF SMALL INTESTINE WITH ANASTOMOSIS -SMALL BOWEL REPAIR -LYSIS OF ADHESIONS x 115 MINUTES (66% OF CASE),  -INTRAOPERATIVE ASSESSMENT OF TISSUE  VASCULAR PERFUSION USING ICG (indocyanine green ) IMMUNOFLUORESCENCE,  -TRANSVERSUS ABDOMINIS PLANE (TAP) BLOCK - BILATE   HPI:  83 y.o. female s/p rectosigmoid resection takedown on 5/29. Course complicated by A-fib with RVR and then hypotension/rectal bleeding and syncope on 05/11/2024.  Patient  had postop ileus needing NG tube and TPN.  Imaging on 05/19/2024 showed possible abscesses. S/P IR guided drains placed on 05/20/2024.  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  Swallow eval ordered. Pt is s/p PEG placement.  She required intubation for surgery 6/24 and was extubated 6/25.  Chest imaging showed Enteric tube with side hole just below the GE junction, recommend  advancement.     Mild prominence of the central pulmonary vasculature without overt  pulmonary edema.    Assessment / Plan / Recommendation  Clinical Impression  Patient currently grossly weak with difficulties even holding her head in neutral for more than 30 seconds without cues.  Voice is very hoarse but loud enough for SLP to hear her at approximately 6 feet away.  No focal cranial nerve deficits noted but patient did lean to the left consistently, even after repositioning neutral.  She did request intake of ice chips and water  and obtain permission from RN to provide.  She tolerated ice chips and single teaspoons of water  without significant concerns for pharyngeal deficits, subjectively slight delay with swallowing noted and patient required 2-3 swallows per teaspoon of liquid.  However once increased to small single sip patient swallow followed by increased work of breathing with shallow rapid respirations and needed to conduct 4-5 subswallows concerning for pharyngeal retention.  At this time recommend single ice chips  and single teaspoons of water  after mouth care.  SLP will follow-up for dysphagia treatment as suspect patient has severe deconditioning, disuse use muscle atrophy from 27-day hospital course impacting pharyngeal swallow with potential exacerbation due to her short term intubation.  Patient informed in swallow precautions sign created and posted above bed.    In courage patient to work on strength of her voice and cough as well as holding head neutral to help maximize her airway protection and rehab.  Thank you SLP Visit Diagnosis: Dysphagia, unspecified (R13.10);Dysphagia, pharyngeal phase (R13.13)    Aspiration Risk       Diet Recommendation Ice chips PRN after oral care (ice chips and tsps of water  within MD restrictions)    Medication Administration: Via alternative means Supervision: Full supervision/cueing for compensatory strategies Compensations: Slow rate;Small sips/bites Postural Changes: Remain upright for at least 30 minutes after po intake;Seated upright at 90 degrees    Other  Recommendations Oral Care Recommendations: Oral care prior to ice chip/H20     Assistance Recommended at Discharge    Functional Status Assessment Patient has had a recent decline in their functional status and demonstrates the ability to make significant improvements in function in a reasonable and predictable amount of time.  Frequency and Duration min 2x/week  2 weeks       Prognosis  Prognosis for improved oropharyngeal function: Guarded Barriers to Reach Goals: Other (Comment) (severity of deficit subjectively)      Swallow Study   General Date of Onset: 06/05/24 HPI: 83 y.o. female s/p rectosigmoid resection takedown on 5/29. Course complicated by A-fib with RVR and then hypotension/rectal bleeding and syncope on 05/11/2024.  Patient  had postop ileus needing NG tube and TPN.  Imaging on 05/19/2024 showed possible abscesses. S/P IR guided drains placed on 05/20/2024.  PMH includes:  hypertension,  history of partial resection of the colon with colostomy, remote history of atrial fibrillation, PACs, prior DVT and small PE, previously on anticoagulation then was placed on IVC filter which was subsequently removed and hyperlipidemia.  Swallow eval ordered. Pt is s/p PEG placement.  She required intubation for surgery 6/24 and was extubated 6/25.  Chest imaging showed Enteric tube with side hole just below the GE junction, recommend  advancement.     Mild prominence of the central pulmonary vasculature without overt  pulmonary edema. Type of Study: Bedside Swallow Evaluation Previous Swallow Assessment: SLE BSE when in hospital with GI system issues - rec regular/thin diet - subtle cough with large sequential boluses of thin Diet Prior to this Study: NPO Temperature Spikes Noted: No Respiratory Status: Nasal cannula History of Recent Intubation: Yes Total duration of intubation (days): 2 days Date extubated: 06/05/24 Behavior/Cognition: Alert;Other (Comment) (does not consistently follow directions) Oral Care Completed by SLP: No (no materials in room to provide oral care and pt is on contact precautions) Oral Cavity - Dentition: Adequate natural dentition Vision: Functional for self-feeding Self-Feeding Abilities: Needs assist Patient Positioning: Upright in bed Baseline Vocal Quality: Hoarse Volitional Cough: Weak Volitional Swallow: Able to elicit    Oral/Motor/Sensory Function Overall Oral Motor/Sensory Function: Generalized oral weakness (GROSS generalized weakness noted; pt has difficulty holding her head neutral for more than 30 seconds and cough/voice are grossly weak)   Ice Chips Ice chips: Within functional limits Presentation: Spoon   Thin Liquid Thin Liquid: Impaired Presentation: Cup;Self Fed;Spoon Oral Phase Impairments: Reduced labial seal Oral Phase Functional Implications: Left anterior spillage Pharyngeal  Phase Impairments: Multiple swallows;Change in Vital Signs;Other  (comments)    Nectar Thick Nectar Thick Liquid: Not tested   Honey Thick Honey Thick Liquid: Not tested   Puree Puree: Not tested   Solid     Solid: Not tested      Nicolas Emmie Caldron 06/05/2024,8:42 AM Madelin POUR, MS [facility] SLP Acute Rehab Services Office [phone]

## 2024-06-05 NOTE — Progress Notes (Signed)
 PHARMACY - ANTICOAGULATION CONSULT NOTE  Pharmacy Consult for heparin  Indication: atrial fibrillation (apixaban  on hold)  Allergies  Allergen Reactions   Elemental Sulfur Anaphylaxis    Patient Measurements: Height: 5' 5 (165.1 cm) Weight: 65.2 kg (143 lb 11.8 oz) IBW/kg (Calculated) : 57 HEPARIN  DW (KG): 61.3  Vital Signs: Temp: 98.6 F (37 C) (06/26 1700) Temp Source: Oral (06/26 1700) BP: 147/83 (06/26 1700) Pulse Rate: 96 (06/26 1700)  Labs: Recent Labs    06/04/24 0609 06/04/24 1522 06/04/24 1523 06/04/24 1546 06/05/24 0429 06/05/24 0830 06/05/24 1530  HGB  --   --   --  7.2* 7.1*  --   --   HCT  --   --   --  23.8* 23.3*  --   --   PLT  --   --   --  370 361  --   --   APTT  --  41*  --   --  40*  --   --   HEPARINUNFRC  --   --  0.60  --  0.19*  --  0.16*  CREATININE 0.77  --   --   --   --  0.55  --     Estimated Creatinine Clearance: 47.9 mL/min (by C-G formula based on SCr of 0.55 mg/dL).   Medical History: Past Medical History:  Diagnosis Date   A-fib (HCC) 04/09/2016   Acute head injury 04/09/2016   Anemia    Blood dyscrasia    Hx DVT / PE   Cervical cancer (HCC)    Cervical spine fracture, initial encounter 04/09/2016   Claustrophobia    extremely claustrophobic   Dupuytren disease of palm of both hands    Essential hypertension    Fatty liver    History of kidney stones    MCI (mild cognitive impairment) with memory loss    Multiple fractures of cervical spine (HCC) 04/09/2016   Restless leg    Syncope and collapse 04/09/2016    Medications: Pt was not on anticoagulants PTA, however, apixaban  was started during admission for atrial fibrillation.  -Apixaban  2.5 mg PO BID, last dose given 6/23 @ 21:30  Assessment: Pt is an 89 yoF who was started on apixaban  during this admission for atrial fibrillation. Pt underwent surgery on 6/24 - placement of G-tube, I&D of abscess, diverting colostomy. CCS recommending heparin  drip post-op until  pt having BM.   Pharmacy consulted to dose heparin  for afib while apixaban  on hold. Baseline labs ordered STAT (heparin  level, aPTT, CBC).   PM, 06/05/24 Heparin  level still SUBtherapeutic on current IV heparin  rate of 1100 units/hr despite increase in rate early this AM Per RN, no bleeding or issues noted  Goal of Therapy:  Heparin  level 0.3-0.7 units/ml aPTT 66-102 seconds Monitor platelets by anticoagulation protocol: Yes   Plan:  Increase heparin  drip from 1100 units/hr to 1400 units/hr Recheck heparin  level in 8 hours CBC, heparin  level daily Monitor for signs of bleeding Follow for ability to resume apixaban    Eva CHRISTELLA Allis, PharmD, BCPS Secure Chat if ?s 06/05/2024 5:34 PM

## 2024-06-05 NOTE — Consult Note (Addendum)
 WOC Nurse wound consult Note: Vac dressing changed.  Pt was medicated for pain prior to the procedure and tolerated with mod amt discomfort. Midline abd with full thickness post-op wound is beefy red, small amt pink drainage in the cannister, 16X2X1cm, more shallow at the top then at the bottom of the wound.  Applied one piece black foam to cont suction.  WOC team will plan to change again on Monday; supplies at the bedside.   WOC Nurse ostomy consult note Pt is familiar with ostomy care since she previously had an ostomy which was reversed.  She had another colostomy surgery performed on 6/24. No family members at the bedside.  Pt does not watch the process or participate in the process; she will need to be independent with pouch application and emptying if she intends to discharge home, instead of a SNF.   Stoma type/location: Stoma is red and viable, 1 1/2 inches, slightly above skin level, small amt pink liquid in the pouch, no stool or flatus. Stoma is in close proximity to the Vac dressing and both may need to be changed each time. No family present.  Applied barrier ring and one piece flexible convex pouch. 3 sets of supplies left at the bedside for staff nurses use: Use barrier ring, Lawson # [phone] and one piece convex pouch Lawson # [id number].  Enrolled patient in [facility] DC program: Yes, today WOC team will continue to follow for further teaching and will see again on Monday.  Thank-you,  Stephane Fought MSN, RN, CWOCN, [place], CNS [phone]

## 2024-06-05 NOTE — Progress Notes (Signed)
 Nutrition Follow-up  DOCUMENTATION CODES:   Severe malnutrition in context of chronic illness  INTERVENTION:  - Per Surgery, starting trickle feeds today: Vital High Protein @ 88mL/hr - provides 480 kcals, 42g protein, and free water   Tube feeding recommendations: - Osmolite 1.5 @ 50 ml/hr  - Advancements per Surgery, recommend 10mL Q12-24 hours -Provides 1800 kcals, 75g protein and 914 ml H2O  - Plan to continue goal TPN for now.  - TPN per Pharmacy   -Daily weights while on TPN    NUTRITION DIAGNOSIS:   Severe Malnutrition related to chronic illness as evidenced by severe fat depletion, severe muscle depletion. *ongoing  GOAL:   Patient will meet greater than or equal to 90% of their needs *met with TPN  MONITOR:   Diet advancement, Labs, Weight trends  REASON FOR ASSESSMENT:   Consult Enteral/tube feeding initiation and management (trickle tube feeds, RD to provide recs)  ASSESSMENT:   83 year old female with PMH HTN, mild cognitive impairment, asd large bowel obstruction (October 2024) s/p Hartman's resection and colostomy creation who was admitted 5/29 for colostomy reversal.  5/29 Admit; s/p colostomy reversal, small bowel resection, and extensive lysis of adhesions; CLD 5/30 Soft diet -> FLD 6/2 NPO; NGT placed; TPN to be initiated 6/3 CLD 6/4-6/5 Dysphagia 1 -> Soft-> Reg diet, Calorie Count #1 completed, consumed 11-14% of needs 6/6 Soft diet 6/7-6/8 Calorie Count #2 completed, consumed 10-22% of needs 6/9 Heart Healthy -> NPO 6/10 s/p drain placement 6/11 CLD 6/12 FLD -> Regular diet -> soft diet 6/13-6/15 Regular diet, Calorie Count #3 -consumed 1-4% of needs 6/16 Heart Healthy diet 6/17 NPO ->Regular diet 6/19-6/20 Calorie Count #4 -pt consumed 0-10% of needs 6/24 TPN increased back to goal; NPO; s/p G-tube placement, diverting loop colostomy, drain placement, returned to ICU intubated 6/25 Extubated 6/26 Trickle feeds to be started via  G-tube; SLP eval -> ice chips prn  Patient moaning in pain at time of visit. Noted to be very lethargic. Reports her abdomen is very sore and painful.  Discussed plan to initiate tube feeds, continue TPN for now.  Of note, patient had SLP eval today and reportedly very weak. Diet recommendation for ice chips prn.    Admit weight: 143# Current weight: 127# I&O's: +26.5L since 6/12 + for generalized   Medications reviewed and include: -  Labs reviewed:  Phosphorus 1.9 Triglycerides 150 (as of 6/25)   Diet Order:   Diet Order             Diet NPO time specified Except for: Ice Chips  Diet effective now           Diet - low sodium heart healthy                   EDUCATION NEEDS:  Education needs have been addressed  Skin:  Skin Assessment: Skin Integrity Issues: Skin Integrity Issues:: Stage II, Incisions Stage II: Right Buttocks Incisions: Abdomen  Last BM:  6/26 - colostomy  Height:  Ht Readings from Last 1 Encounters:  05/09/24 5' 5 (1.651 m)   Weight:  Wt Readings from Last 1 Encounters:  06/05/24 65.2 kg   BMI:  Body mass index is 23.92 kg/m.  Estimated Nutritional Needs:  Kcal:  1600-1750 kcals Protein:  80-95 grams Fluid:  >/= 1.6L    Trude Ned RD, LDN Contact via Secure Chat.

## 2024-06-05 NOTE — Progress Notes (Signed)
 06/05/2024  Elizabeth Odom [id number] May 14, 1941  CARE TEAM: PCP: Jesus Bernardino MATSU, MD  Outpatient Care Team: Patient Care Team: Jesus Bernardino MATSU, MD as PCP - General (Internal Medicine) Elmira Newman PARAS, MD as PCP - Cardiology (Cardiology) [name], MD as Consulting Physician (Gastroenterology) Rubin Calamity, MD as Consulting Physician (General Surgery) [name], MD (Neurology) [name], MD as Consulting Physician (Urology) [name], MD as Consulting Physician (Cardiology) [name], MD as Consulting Physician (Colon and Rectal Surgery)  Inpatient Treatment Team: Treatment Team:  Darci Pore, MD Nikki Rams, Aliene, MD Dennise Kingsley, MD [name], RN Pccm, Md, MD Meade Verdon RAMAN, MD Rosan Deward ORN, NP Brien Blew, NT Willodean Estela LABOR, RN Claudene Ruben SQUIBB, NT Izola Brodie, RN Bobbette File, MD Sonjia Held, MD Chaya Stann LABOR, PT Nelwyn Ole BIRCH, NT Casimir Camelia RAMAN, [facility] [name], MD   Problem List:   Principal Problem:   Diverticular disease Active Problems:   A-fib [facility])   Restless leg   ABLA (acute blood loss anemia)   Need for emotional support   Acute urinary retention   Mixed hyperlipidemia   Essential hypertension   Diverticular disease of left colon   History of DVT (deep vein thrombosis)   History of pulmonary embolus (PE)   Pre-diabetes   Presence of IVC filter   Stricture of sigmoid colon (HCC)   Protein-calorie malnutrition, severe   Hypokalemia   05/08/2024  POST-OPERATIVE DIAGNOSIS:   COLOSTOMY FOR RESECTION, DESIRE FOR OSTOMY TAKEDOWN RECTAL STRICTURE HISTORY OF DIVERTICULITIS WITH PERFORATION & ABSCESS   PROCEDURE:   -ROBOTIC RECTOSIGMOID RESECTION (LAR) -TAKEDOWN OF END COLOSTOMY WITH ANASTOMOSIS -RESECTION OF SMALL INTESTINE WITH ANASTOMOSIS -SMALL BOWEL REPAIR -LYSIS OF ADHESIONS x 115 MINUTES (66% OF CASE),  -INTRAOPERATIVE ASSESSMENT OF TISSUE VASCULAR  PERFUSION USING ICG (indocyanine green ) IMMUNOFLUORESCENCE,  -TRANSVERSUS ABDOMINIS PLANE (TAP) BLOCK - BILATERAL -FLEXIBLE SIGMOIDOSCOPY   SURGEON:  [name], MD   05/20/2024  Post procedural Dx: Post op abscess, multiple   Technically successful CT guided placed of a  10 Fr drainage catheter placement x 2  into the midline ventral and dorsal perirectal abscesses.     [name] LABOR, MD    05/28/2024  IMPRESSION: Satisfactory removal of the midline anterior abscess drainage catheter and placement of a new midline anterior pelvic drainage catheter as well as a posterior transgluteal drainage catheter in pelvic abscesses.     Electronically Signed   By: [name]    06/03/2024  POST-OPERATIVE DIAGNOSIS:   DELAYED COLORECTAL ANASTOMOTIC LEAK, NEED FOR FECAL DIVERSION CHRONIC PELVIC ABSCESS   PROCEDURE:   DIAGNOSTIC LAPAROSCOPY CONVERTED TO LAPAROTOMY DIVERTING LOOP TRANSVERSE COLOSTOMY DRAINAGE OF ABDOMINAL ABSCESS GASTROSTOMY TUBE PLACEMENT RECTAL TUBE PLACEMENT   SURGEON:  [name], MD   OR FINDINGS: Very dense concrete intraperitoneal and pelvic adhesions infraumbilically.  Bladder, small bowel, omentum walling off the lower pelvis with delayed anastomotic leak.   Near disruption of colorectal anastomosis by transanal examination.  Rectal tube placed through rectal stump to drain the pelvis more directly.   Diverting mid-transverse loop colostomy done.   Removal of old percutaneous abdominal and transgluteal drains.     Assessment [facility] Stay = 28 days) 2 Days Post-Op    Failure to thrive with delayed anastomotic leak    Plan:  Delayed anastomotic leak  status post percutaneous drainage   with failure diverting loop transverse colostomy (lower abdomen extremely fixed).  6/25 with very fixed  abdomen.    Internal drainage with rectal tube overnight.  Removed.  Should continue to transanally drain  Intraperitoneal drainage  going over near prior abdominal abscess down towards left pelvis.  Keep for now.  Severe malnutrition TPN at 100% for now.  G-tube without significant output nor any worsening pain or distention for now.  Start trophic tube feeds through the G-tube.  If patient has return of bowel function through colostomy advance to goal and eventually start cycling and bolusing.  Okay to try sips for now.  If has worsening pain or distention, placed NG tube to low intermittent wall suction and regroup.  If that fails then replace NG tube as well.  Hopefully not as likely.   Antibiotics.  Because she has source control, I would lean towards doing 5 more days and stopping to regroup since there was concern of the MRSA and Candida being colonization may be the only legitimate organism is Pseudomonas of colonization anyway.  Redo MRSA decolonization.  Agree with infectious ease to consider CT scan postop day 5 to make sure were not missing any intra-abdominal fluid collections.    Some recurrent atrial fibrillation and rapid rate.  History of thromboembolic events and IVC filter on chronic anticoagulation.  I restarted low-dose metoprolol .  See what internal medicine thinks.  Back on heparin  anticoagulation.  Acute blood loss anemia in the setting of anemia of chronic disease.  Hemoglobin borderline.  With intermittent tachycardia and regularity will transfuse 1 unit of blood to help take her back up.  IV iron weekly as well.  If she has good bowel function can switch to enteral but I am guarded that she will have good bowel absorption anytime soon   History of urinary retention.  Will keep Foley catheter for 3 days postop and consider removal, especially if we can get her mobilizing out of bed.  -monitor electrolytes & replace as needed.  Keep K>4, Mg>2, Phos>3  -VTE prophylaxis- SCDs.  Anticoagulation as appropriate  -mobilize as tolerated to help recovery.  Try and get therapists involved to help her  mobilize more.  Biggest challenge.  Patient does not like the idea of going to a skilled facility or rehab as she needed after her month hospital stay last year from emergency surgery.  I do not think that that is avoidable at this time. Work to have strategies to help her get up and mobilize and actually work with physical therapy, or she is going to lose her argument.    I updated the patient's status to the patient and nurse.  Recommendations were made.  Questions were answered.  They expressed understanding & appreciation.  -Disposition: TBD - I think she needs SNF.  She has been very resistant, but she may reconsider      I reviewed last 24 h vitals and pain scores, last 48 h intake and output, last 24 h labs and trends, and last 24 h imaging results.  I have reviewed this patient's available data, including medical history, events of note, test results, etc as part of my evaluation.   A significant portion of that time was spent in counseling. Care during the described time interval was provided by me.  This care required moderate level of medical decision making.  06/05/2024    Subjective: (Chief complaint)  Extubated.  Improved through the day.  Transfer to progressive floor.  Patient resting.  Tired but denies any nausea or vomiting.    Objective:  Vital signs:  Vitals:  06/04/24 2129 06/05/24 0226 06/05/24 0500 06/05/24 0634  BP: (!) 155/88 (!) 144/64  125/68  Pulse: (!) 104 (!) 101  95  Resp: 20 19  20   Temp: 99 F (37.2 C) 99.7 F (37.6 C)  98.7 F (37.1 C)  TempSrc: Oral Oral  Axillary  SpO2: 99% 99%  100%  Weight:   65.2 kg   Height:        Last BM Date : 06/05/24  Intake/Output   Yesterday:  06/25 0701 - 06/26 0700 In: 2092.5 [I.V.:810.1; IV Piggyback:1282.4] Out: 710 [Urine:650; Drains:60] This shift:  No intake/output data recorded.  Bowel function:  Flatus: No  BM:  No   Drain:  Left lower quadrant Blake drain serosanguineous going  down to left lateral pelvis.  Physical Exam:  General: Intubated sedated.  No focal motor deficits.         Eyes: PERRL, normal EOM.  Sclera clear.  No icterus Neuro: CN II-XII intact w/o focal sensory/motor deficits.  No facial droop. Lymph: No head/neck/groin lymphadenopathy Psych:  No delerium/psychosis/paranoia.  Oriented x 4 HENT: Normocephalic, Mucus membranes moist.  No thrush.  Mild HOH Neck: Supple, No tracheal deviation.  No obvious thyromegaly.  Prefers to sit with head flexed and chin down.  Does not want to have her neck back.  Pillow adjusted. Chest: No pain to chest wall compression.  Good respiratory excursion.  No audible wheezing CV:  Pulses intact.  Regular rhythm.  No major extremity edema MS: Normal AROM mjr joints.  No obvious deformity  Abdomen:  Soft.  Moderately distended.  Nontender.  No guarding.   Right upper quadrant mid transverse diverting loop colostomy pink with edema.  Minimal thin bilious stool and scant gas   GU: Foley in place with light orange clear urine  Rectal: Some erythema but improved overall.  No diarrhea in bed.  Removed rectal tube. Ext:   No deformity.  No mjr edema.  No cyanosis Skin: No petechiae / purpurea.  No major sores.  Warm and dry    Results:   Cultures: Recent Results (from the past 720 hours)  Urine Culture     Status: Abnormal   Collection Time: 05/09/24  8:30 PM   Specimen: Urine, Clean Catch  Result Value Ref Range Status   Specimen Description   Final    URINE, CLEAN CATCH Performed at [facility], [address] [place], [place] [address]    Special Requests   Final    NONE Performed at [facility], [address], [place], [place] [address]    Culture (A)  Final    <10,000 COLONIES/mL INSIGNIFICANT GROWTH Performed at [facility], [address], [place], [place] [address]    Report Status 05/11/2024 FINAL  Final  MIC (1 Drug)-Abdominal drain abscess;  05/22/2024; Abdomen; MRSA; Daptomycin      Status: Abnormal   Collection Time: 05/22/24  1:54 PM   Specimen: Abdomen  Result Value Ref Range Status   Min Inhibitory Conc (1 Drug) Final report (A)  Corrected    Comment: (NOTE) Performed At: [facility] [address] [place], [place] [id number] Jennette Shorter MD Ey:[id number] CORRECTED ON 06/24 AT 0836: PREVIOUSLY REPORTED AS Preliminary report    Source ABSCESS  Final    Comment: Performed at [facility], [address] [place], [place] [address]  MIC Result     Status: Abnormal   Collection Time: 05/22/24  1:54 PM  Result Value  Ref Range Status   Result 1 (MIC) Comment (A)  Final    Comment: (NOTE) Methicillin - resistant Staphylococcus aureus Identification performed by account, not confirmed by this laboratory. DAPTOMYCIN    <=1 UG/ML = SUSCEPTIBLE Performed At: [facility] [address] [place], [place] [id number] Jennette Shorter MD Ey:[id number]   Aerobic/Anaerobic Culture w Gram Stain (surgical/deep wound)     Status: None   Collection Time: 05/22/24  3:59 PM   Specimen: Abdomen  Result Value Ref Range Status   Specimen Description   Final    ABDOMEN Performed at [facility], [address] [place], [place] [address]    Special Requests   Final    ABDOMINAL DRAIN Performed at [facility], [address], [place], [place] [address]    Gram Stain   Final    ABUNDANT WBC PRESENT, PREDOMINANTLY PMN RARE GRAM POSITIVE COCCI RARE YEAST WITH PSEUDOHYPHAE    Culture   Final    MODERATE PSEUDOMONAS AERUGINOSA MODERATE STAPHYLOCOCCUS AUREUS SUSCEPTIBILITIES PERFORMED ON PREVIOUS CULTURE WITHIN THE LAST 5 DAYS. MODERATE CANDIDA ALBICANS SEE SEPARATE REPORT NO ANAEROBES ISOLATED Performed at [facility], [address] [place], [place] [address]    Report Status 06/04/2024 FINAL  Final   Organism ID, Bacteria PSEUDOMONAS AERUGINOSA  Final       Susceptibility   Pseudomonas aeruginosa - MIC*    CEFTAZIDIME 4 SENSITIVE Sensitive     CIPROFLOXACIN <=0.25 SENSITIVE Sensitive     GENTAMICIN 4 SENSITIVE Sensitive     IMIPENEM 2 SENSITIVE Sensitive     PIP/TAZO <=4 SENSITIVE Sensitive ug/mL    CEFEPIME 2 SENSITIVE Sensitive     * MODERATE PSEUDOMONAS AERUGINOSA  Aerobic/Anaerobic Culture w Gram Stain (surgical/deep wound)     Status: None   Collection Time: 05/22/24  3:59 PM   Specimen: Back  Result Value Ref Range Status   Specimen Description   Final    BACK Performed at [facility], [address], [place], [place] [address]    Special Requests   Final    TRANSGLUTEAL PERCUTANEOUS IR DRAIN Performed at [facility], [address] [place], [place] [address]    Gram Stain   Final    NO WBC SEEN ABUNDANT GRAM NEGATIVE RODS RARE GRAM POSITIVE COCCI IN PAIRS RARE BUDDING YEAST SEEN    Culture   Final    ABUNDANT PSEUDOMONAS AERUGINOSA ABUNDANT METHICILLIN RESISTANT STAPHYLOCOCCUS AUREUS ABUNDANT ENTEROCOCCUS FAECALIS ABUNDANT CANDIDA ALBICANS NO ANAEROBES ISOLATED Sent to [facility] for further susceptibility testing. Performed at [facility], [address], [place], [place] [address]    Report Status 05/27/2024 FINAL  Final   Organism ID, Bacteria PSEUDOMONAS AERUGINOSA  Final   Organism ID, Bacteria METHICILLIN RESISTANT STAPHYLOCOCCUS AUREUS  Final   Organism ID, Bacteria ENTEROCOCCUS FAECALIS  Final      Susceptibility   Enterococcus faecalis - MIC*    AMPICILLIN <=2 SENSITIVE Sensitive     VANCOMYCIN  1 SENSITIVE Sensitive     GENTAMICIN SYNERGY SENSITIVE Sensitive     * ABUNDANT ENTEROCOCCUS FAECALIS   Methicillin resistant staphylococcus aureus - MIC*    CIPROFLOXACIN >=8 RESISTANT Resistant     ERYTHROMYCIN  >=8 RESISTANT Resistant     GENTAMICIN <=0.5 SENSITIVE Sensitive     OXACILLIN >=4 RESISTANT Resistant     TETRACYCLINE <=1 SENSITIVE Sensitive      VANCOMYCIN  1 SENSITIVE Sensitive     TRIMETH/SULFA >=320 RESISTANT Resistant  CLINDAMYCIN <=0.25 SENSITIVE Sensitive     RIFAMPIN <=0.5 SENSITIVE Sensitive     Inducible Clindamycin NEGATIVE Sensitive     LINEZOLID 2 SENSITIVE Sensitive     * ABUNDANT METHICILLIN RESISTANT STAPHYLOCOCCUS AUREUS   Pseudomonas aeruginosa - MIC*    CEFTAZIDIME 4 SENSITIVE Sensitive     CIPROFLOXACIN <=0.25 SENSITIVE Sensitive     GENTAMICIN 4 SENSITIVE Sensitive     IMIPENEM 2 SENSITIVE Sensitive     PIP/TAZO <=4 SENSITIVE Sensitive ug/mL    CEFEPIME 2 SENSITIVE Sensitive     * ABUNDANT PSEUDOMONAS AERUGINOSA  Yeast Susceptibilities     Status: None   Collection Time: 05/22/24  3:59 PM  Result Value Ref Range Status   SOURCE CANDIDA ALBICAN ABD ABSC  Final    Comment: Performed at [facility], [address], [place], [place] [address]   Organism ID, Yeast Candida albicans  Final    Comment: (NOTE) Identification performed by account, not confirmed by this laboratory.    Amphotericin B MIC 0.25 ug/mL  Final    Comment: (NOTE) Breakpoints have been established for only some organism-drug combinations as indicated. This test was developed and its performance characteristics determined by [facility]. It has not been cleared or approved by the Food and Drug Administration.    Anidulafungin MIC Comment  Final    Comment: (NOTE) 0.03 ug/mL Susceptible Breakpoints have been established for only some organism-drug combinations as indicated. This test was developed and its performance characteristics determined by [facility]. It has not been cleared or approved by the Food and Drug Administration.    Caspofungin MIC Comment  Final    Comment: (NOTE) 0.12 ug/mL Susceptible Breakpoints have been established for only some organism-drug combinations as indicated. This test was developed and its performance characteristics determined by [facility]. It has not been cleared or approved by the Food and  Drug Administration.    Fluconazole  Islt MIC 1.0 ug/mL Susceptible  Final    Comment: (NOTE) Breakpoints have been established for only some organism-drug combinations as indicated. This test was developed and its performance characteristics determined by [facility]. It has not been cleared or approved by the Food and Drug Administration.    ISAVUCONAZOLE MIC 0.008 ug/mL or less  Final    Comment: (NOTE) This test was developed and its performance characteristics determined by [facility]. It has not been cleared or approved by the Food and Drug Administration.    Itraconazole MIC 0.06 ug/mL  Final    Comment: (NOTE) Breakpoints have been established for only some organism-drug combinations as indicated. This test was developed and its performance characteristics determined by [facility]. It has not been cleared or approved by the Food and Drug Administration.    Micafungin  MIC Comment  Final    Comment: (NOTE) 0.008 ug/mL or less, Susceptible Breakpoints have been established for only some organism-drug combinations as indicated. This test was developed and its performance characteristics determined by [facility]. It has not been cleared or approved by the Food and Drug Administration.    Posaconazole MIC 0.06 ug/mL  Final    Comment: (NOTE) Breakpoints have been established for only some organism-drug combinations as indicated. This test was developed and its performance characteristics determined by [facility]. It has not been cleared or approved by the Food and Drug Administration.    REZAFUNGIN MIC Comment  Final    Comment: (NOTE) 0.15 ug/mL Susceptible This test was developed and its performance characteristics determined by [facility]. It has not been cleared or approved  by the Food and Drug Administration.    Voriconazole MIC Comment  Final    Comment: (NOTE) 0.15 ug/mL Susceptible Breakpoints have been established for only some organism-drug combinations as indicated. This  test was developed and its performance characteristics determined by [facility]. It has not been cleared or approved by the Food and Drug Administration. Performed At: [facility] [address] [place], [place] [id number] Jennette Shorter MD Ey:[id number]   Aerobic/Anaerobic Culture w Gram Stain (surgical/deep wound)     Status: None   Collection Time: 05/27/24  5:19 PM   Specimen: Abscess  Result Value Ref Range Status   Specimen Description   Final    ABSCESS ABDOMEN Performed at [facility], [address], [place], [place] [address]    Special Requests   Final    NONE Performed at [facility], [address], [place], [place] [address]    Gram Stain   Final    ABUNDANT WBC PRESENT, PREDOMINANTLY PMN NO ORGANISMS SEEN    Culture   Final    RARE PSEUDOMONAS AERUGINOSA RARE CANDIDA ALBICANS NO ANAEROBES ISOLATED Performed at [facility], [address], [place], [place] [address]    Report Status 06/01/2024 FINAL  Final   Organism ID, Bacteria PSEUDOMONAS AERUGINOSA  Final      Susceptibility   Pseudomonas aeruginosa - MIC*    CEFTAZIDIME 4 SENSITIVE Sensitive     CIPROFLOXACIN <=0.25 SENSITIVE Sensitive     GENTAMICIN 4 SENSITIVE Sensitive     IMIPENEM 2 SENSITIVE Sensitive     PIP/TAZO <=4 SENSITIVE Sensitive ug/mL    CEFEPIME 2 SENSITIVE Sensitive     * RARE PSEUDOMONAS AERUGINOSA  Surgical pcr screen     Status: Abnormal   Collection Time: 06/02/24  7:55 PM   Specimen: Nasal Mucosa; Nasal Swab  Result Value Ref Range Status   MRSA, PCR POSITIVE (A) NEGATIVE Final   Staphylococcus aureus POSITIVE (A) NEGATIVE Final    Comment: (NOTE) The Xpert SA Assay (FDA approved for NASAL specimens in patients 71 years of age and older), is one component of a comprehensive surveillance program. It is not intended to diagnose infection nor to guide or monitor treatment. Performed at [facility], [address] [place], [place] [address]     Labs: Results for orders placed or performed during the hospital encounter of 05/08/24 (from the past 48 hours)  Type and screen [facility] Now for preop     Status: None   Collection Time: 06/03/24 11:45 AM  Result Value Ref Range   ABO/RH(D) A POS    Antibody Screen NEG    Sample Expiration      06/06/2024,2359 Performed at [facility], [address] [place], [place] [address]   Blood gas, arterial     Status: Abnormal   Collection Time: 06/03/24  5:40 PM  Result Value Ref Range   FIO2 80 %   Delivery systems VENTILATOR    Mode PRESSURE REGULATED VOLUME CONTROL    MECHVT 450 mL   RATE 20 resp/min   PEEP 5 cm H20   pH, Arterial 7.37 7.35 - 7.45   pCO2 arterial 35 32 - 48 mmHg   pO2, Arterial 197 (H) 83 - 108 mmHg   Bicarbonate 20.3 20.0 - 28.0 mmol/L   Acid-base deficit 4.5 (H) 0.0 - 2.0 mmol/L   O2 Saturation 100 %   Patient temperature 36.3  Collection site A-LINE DRAW    Drawn by [phone]     Comment: Performed at [facility], [address] [place], [place] [address]  Glucose, capillary     Status: Abnormal   Collection Time: 06/03/24  7:53 PM  Result Value Ref Range   Glucose-Capillary 163 (H) 70 - 99 mg/dL    Comment: Glucose reference range applies only to samples taken after fasting for at least 8 hours.  Glucose, capillary     Status: Abnormal   Collection Time: 06/03/24 11:57 PM  Result Value Ref Range   Glucose-Capillary 152 (H) 70 - 99 mg/dL    Comment: Glucose reference range applies only to samples taken after fasting for at least 8 hours.  Glucose, capillary     Status: Abnormal   Collection Time: 06/04/24  4:00 AM  Result Value Ref Range   Glucose-Capillary 151 (H) 70 - 99 mg/dL    Comment: Glucose reference range applies only to samples taken after fasting for at least 8 hours.  Comprehensive metabolic panel with GFR     Status: Abnormal    Collection Time: 06/04/24  6:09 AM  Result Value Ref Range   Sodium 132 (L) 135 - 145 mmol/L   Potassium 3.9 3.5 - 5.1 mmol/L   Chloride 101 98 - 111 mmol/L   CO2 22 22 - 32 mmol/L   Glucose, Bld 151 (H) 70 - 99 mg/dL    Comment: Glucose reference range applies only to samples taken after fasting for at least 8 hours.   BUN 23 8 - 23 mg/dL   Creatinine, Ser 9.22 0.44 - 1.00 mg/dL   Calcium  7.7 (L) 8.9 - 10.3 mg/dL   Total Protein 6.2 (L) 6.5 - 8.1 g/dL   Albumin  1.7 (L) 3.5 - 5.0 g/dL   AST 34 15 - 41 U/L   ALT 23 0 - 44 U/L   Alkaline Phosphatase 129 (H) 38 - 126 U/L   Total Bilirubin 1.2 0.0 - 1.2 mg/dL   GFR, Estimated >39 >39 mL/min    Comment: (NOTE) Calculated using the CKD-EPI Creatinine Equation (2021)    Anion gap 9 5 - 15    Comment: Performed at [facility], [address] [place], [place] [address]  Magnesium      Status: None   Collection Time: 06/04/24  6:09 AM  Result Value Ref Range   Magnesium  1.8 1.7 - 2.4 mg/dL    Comment: Performed at [facility], [address] [place], [place] [address]  Phosphorus     Status: None   Collection Time: 06/04/24  6:09 AM  Result Value Ref Range   Phosphorus 2.8 2.5 - 4.6 mg/dL    Comment: Performed at [facility], [address] [place], [place] [address]  Triglycerides     Status: Abnormal   Collection Time: 06/04/24  6:10 AM  Result Value Ref Range   Triglycerides 150 (H) <150 mg/dL    Comment: Performed at [facility], [address] [place], [place] [address]  Glucose, capillary     Status: Abnormal   Collection Time: 06/04/24  7:37 AM  Result Value Ref Range   Glucose-Capillary 160 (H) 70 - 99 mg/dL    Comment: Glucose reference range applies only to samples taken after fasting for at least 8 hours.  Glucose, capillary     Status: Abnormal   Collection Time: 06/04/24 12:04 PM  Result Value Ref Range  Glucose-Capillary 163 (H) 70 -  99 mg/dL    Comment: Glucose reference range applies only to samples taken after fasting for at least 8 hours.  APTT     Status: Abnormal   Collection Time: 06/04/24  3:22 PM  Result Value Ref Range   aPTT 41 (H) 24 - 36 seconds    Comment:        IF BASELINE aPTT IS ELEVATED, SUGGEST PATIENT RISK ASSESSMENT BE USED TO DETERMINE APPROPRIATE ANTICOAGULANT THERAPY. Performed at [facility], [address], [place], [place] [address]   Heparin  level (unfractionated)     Status: None   Collection Time: 06/04/24  3:23 PM  Result Value Ref Range   Heparin  Unfractionated 0.60 0.30 - 0.70 IU/mL    Comment: (NOTE) The clinical reportable range upper limit is being lowered to >1.10 to align with the FDA approved guidance for the current laboratory assay.  If heparin  results are below expected values, and patient dosage has  been confirmed, suggest follow up testing of antithrombin III levels. Performed at [facility], [address], [place], [place] [address]   CBC     Status: Abnormal   Collection Time: 06/04/24  3:46 PM  Result Value Ref Range   WBC 12.4 (H) 4.0 - 10.5 K/uL   RBC 2.65 (L) 3.87 - 5.11 MIL/uL   Hemoglobin 7.2 (L) 12.0 - 15.0 g/dL   HCT 76.1 (L) 63.9 - 53.9 %   MCV 89.8 80.0 - 100.0 fL   MCH 27.2 26.0 - 34.0 pg   MCHC 30.3 30.0 - 36.0 g/dL   RDW 83.4 (H) 88.4 - 84.4 %   Platelets 370 150 - 400 K/uL   nRBC 0.0 0.0 - 0.2 %    Comment: Performed at [facility], [address], [place], [place] [address]  Glucose, capillary     Status: Abnormal   Collection Time: 06/04/24  4:12 PM  Result Value Ref Range   Glucose-Capillary 136 (H) 70 - 99 mg/dL    Comment: Glucose reference range applies only to samples taken after fasting for at least 8 hours.  Glucose, capillary     Status: Abnormal   Collection Time: 06/04/24 11:37 PM  Result Value Ref Range   Glucose-Capillary 135 (H) 70 - 99 mg/dL    Comment:  Glucose reference range applies only to samples taken after fasting for at least 8 hours.  CBC     Status: Abnormal   Collection Time: 06/05/24  4:29 AM  Result Value Ref Range   WBC 13.5 (H) 4.0 - 10.5 K/uL   RBC 2.44 (L) 3.87 - 5.11 MIL/uL   Hemoglobin 7.1 (L) 12.0 - 15.0 g/dL   HCT 76.6 (L) 63.9 - 53.9 %   MCV 95.5 80.0 - 100.0 fL   MCH 29.1 26.0 - 34.0 pg   MCHC 30.5 30.0 - 36.0 g/dL   RDW 83.0 (H) 88.4 - 84.4 %   Platelets 361 150 - 400 K/uL   nRBC 0.0 0.0 - 0.2 %    Comment: Performed at [facility], [address] [place], [place] [address]  APTT     Status: Abnormal   Collection Time: 06/05/24  4:29 AM  Result Value Ref Range   aPTT 40 (H) 24 - 36 seconds    Comment:        IF BASELINE aPTT IS ELEVATED, SUGGEST PATIENT RISK ASSESSMENT BE USED TO DETERMINE APPROPRIATE ANTICOAGULANT THERAPY. Performed at  [facility], [address], [place], [place] [address]   Heparin  level (unfractionated)     Status: Abnormal   Collection Time: 06/05/24  4:29 AM  Result Value Ref Range   Heparin  Unfractionated 0.19 (L) 0.30 - 0.70 IU/mL    Comment: (NOTE) The clinical reportable range upper limit is being lowered to >1.10 to align with the FDA approved guidance for the current laboratory assay.  If heparin  results are below expected values, and patient dosage has  been confirmed, suggest follow up testing of antithrombin III levels. Performed at [facility], [address] [place], [place] [address]   Glucose, capillary     Status: Abnormal   Collection Time: 06/05/24  5:23 AM  Result Value Ref Range   Glucose-Capillary 146 (H) 70 - 99 mg/dL    Comment: Glucose reference range applies only to samples taken after fasting for at least 8 hours.    Imaging / Studies: DG CHEST PORT 1 VIEW Result Date: 06/03/2024 CLINICAL DATA:  Evaluation to confirm nasogastric and endotracheal tube placement. EXAM: PORTABLE CHEST 1  VIEW COMPARISON:  Chest radiograph 05/12/2024. FINDINGS: Endotracheal tube with tip overlying the trachea just below the level of the clavicular heads approximately 5 cm above the carina. Endotracheal tube with tip overlying the left upper quadrant and side hole within approximately 1 cm of the GE junction. Redemonstrated right PICC terminating in the lower SVC. Lungs are well inflated. Mild prominence of the central pulmonary vasculature. Opacities over the medial right lung base. No pleural effusion or pneumothorax. Similar appearance of the cardiomediastinal silhouette. IVC filter partially visualized. IMPRESSION: Endotracheal tube tip just below the level of the clavicular heads. Enteric tube with side hole just below the GE junction, recommend advancement. Mild prominence of the central pulmonary vasculature without overt pulmonary edema. Electronically Signed   By: Donnice Mania M.D.   On: 06/03/2024 19:33       Medications / Allergies: per chart  Antibiotics: Anti-infectives (From admission, onward)    Start     Dose/Rate Route Frequency Ordered Stop   06/04/24 1400  piperacillin -tazobactam (ZOSYN ) IVPB 3.375 g        3.375 g 12.5 mL/hr over 240 Minutes Intravenous Every 8 hours 06/04/24 0738 06/09/24 1359   06/04/24 1400  DAPTOmycin  (CUBICIN ) IVPB 500 mg/62mL premix        8 mg/kg  60.9 kg 100 mL/hr over 30 Minutes Intravenous Daily 06/04/24 0738 06/09/24 1359   06/03/24 1000  neomycin  (MYCIFRADIN ) tablet 500 mg  Status:  Discontinued        500 mg Oral 3 times daily 06/03/24 0935 06/03/24 1555   06/03/24 1000  metroNIDAZOLE  (FLAGYL ) tablet 500 mg  Status:  Discontinued       Note to Pharmacy: Take 2 pills (=1000mg ) by mouth at 1pm, 3pm, and 10pm the day before your colorectal operation   500 mg Oral 3 times daily 06/03/24 0935 06/03/24 1555   05/29/24 1500  DAPTOmycin  (CUBICIN ) IVPB 500 mg/50mL premix  Status:  Discontinued        8 mg/kg  60.9 kg 100 mL/hr over 30 Minutes  Intravenous Daily 05/29/24 1359 06/04/24 0738   05/28/24 2100  vancomycin  (VANCOREADY) IVPB 750 mg/150 mL  Status:  Discontinued        750 mg 150 mL/hr over 60 Minutes Intravenous Every 12 hours 05/28/24 1419 05/29/24 1359   05/27/24 0800  vancomycin  (VANCOCIN ) IVPB 1000 mg/200 mL premix  Status:  Discontinued  1,000 mg 200 mL/hr over 60 Minutes Intravenous Every 24 hours 05/26/24 1144 05/28/24 1419   05/23/24 1400  piperacillin -tazobactam (ZOSYN ) IVPB 3.375 g  Status:  Discontinued        3.375 g 12.5 mL/hr over 240 Minutes Intravenous Every 8 hours 05/23/24 0804 06/04/24 0738   05/23/24 1200  micafungin  (MYCAMINE ) 100 mg in sodium chloride  0.9 % 100 mL IVPB        100 mg 105 mL/hr over 1 Hours Intravenous Every 24 hours 05/23/24 0755 06/06/24 1159   05/22/24 0800  vancomycin  (VANCOCIN ) IVPB 1000 mg/200 mL premix  Status:  Discontinued        1,000 mg 200 mL/hr over 60 Minutes Intravenous Every 24 hours 05/21/24 0634 05/26/24 0806   05/21/24 0730  vancomycin  (VANCOREADY) IVPB 1250 mg/250 mL        1,250 mg 166.7 mL/hr over 90 Minutes Intravenous  Once 05/21/24 0634 05/21/24 1043   05/19/24 1400  piperacillin -tazobactam (ZOSYN ) IVPB 3.375 g  Status:  Discontinued        3.375 g 12.5 mL/hr over 240 Minutes Intravenous Every 8 hours 05/19/24 1303 05/23/24 0804   05/09/24 0900  erythromycin  250 mg in sodium chloride  0.9 % 100 mL IVPB        250 mg 100 mL/hr over 60 Minutes Intravenous Every 8 hours 05/09/24 0732 05/11/24 0044   05/08/24 2200  cefoTEtan  (CEFOTAN ) 2 g in sodium chloride  0.9 % 100 mL IVPB        2 g 200 mL/hr over 30 Minutes Intravenous Every 12 hours 05/08/24 1759 05/09/24 0749   05/08/24 1400  neomycin  (MYCIFRADIN ) tablet 1,000 mg  Status:  Discontinued       Placed in And Linked Group   1,000 mg Oral 3 times per day 05/08/24 1119 05/08/24 1120   05/08/24 1400  metroNIDAZOLE  (FLAGYL ) tablet 1,000 mg  Status:  Discontinued       Placed in And Linked Group    1,000 mg Oral 3 times per day 05/08/24 1119 05/08/24 1120   05/08/24 1130  cefoTEtan  (CEFOTAN ) 2 g in sodium chloride  0.9 % 100 mL IVPB        2 g 200 mL/hr over 30 Minutes Intravenous On call to O.R. 05/08/24 1119 05/09/24 0749         Note: Portions of this report may have been transcribed using voice recognition software. Every effort was made to ensure accuracy; however, inadvertent computerized transcription errors may be present.   Any transcriptional errors that result from this process are unintentional.    Elspeth KYM Schultze, MD, FACS, MASCRS Esophageal, Gastrointestinal & Colorectal Surgery Robotic and Minimally Invasive Surgery  [facility] [address] [place], [place] [address] [phone] Fax [phone] Main  CONTACT INFORMATION: Weekday (9AM-5PM): Call [facility] main office at [phone] Weeknight (5PM-9AM) or Weekend/Holiday: Check EPIC Web Links tab & use AMION (password  [email or web address]) for General Surgery [facility] coverage  Please, DO NOT use SecureChat  (it is not reliable communication to reach operating surgeons & will lead to a delay in care).   Epic staff messaging available for outptient concerns needing 1-2 business day response.      06/05/2024  7:21 AM

## 2024-06-05 NOTE — Progress Notes (Signed)
 PROGRESS NOTE  Elizabeth Odom FMW:[id number] DOB: 1941/01/02 DOA: 05/08/2024 PCP: Jesus Bernardino MATSU, MD   LOS: 28 days   Brief narrative:  Elizabeth Odom is a 83 y.o. female with a history of atrial fibrillation, DVT, PE, anemia, PACs, hypertension, mild cognitive impairment, history of large bowel obstruction status post colonic resection and colostomy creation last year.  Her clinical course was complicated by anastomotic leak in presented last month for a colostomy resection and takedown but had complicated postop course with dense adhesions.  Patient also had atrial fibrillation with RVR polypharmacy and altered mental status.  At this time patient has been in the hospital for intra-abdominal abscess with polymicrobial growth and has been having poor oral intake and on TPN.  Patient was taken back to the OR on 625 for lysis of adhesions colostomy placement and drainage of abscess.  Rectal tube was placed thin for drainage.  Patient also had G-tube placed in for nutrition.  Intraoperatively patient had hypotension requiring vasopressors but this resolved and patient has been extubated as well.  At this time patient has been considered for transfer out of the ICU  Sequence of events. 5/29 presented for elective colostomy takedown per Dr. Sheldon  6/10 Transgluteal drain placed per IR 6/17 CT guided drain placed into the abdominal and pelvic abscesses yielding pus and stool.   6/24 Back to the OR with Dr. Sheldon for lysis of adhesions, colostomy placement, drainage of abscess, and placement of G-tube, Intraoperatively she had episode of hypotension requiring vasopressors immediately upon insufflation to the abdomen. 6/25 extubated.  Assessment/Plan: Principal Problem:   Diverticular disease Active Problems:   A-fib (HCC)   Restless leg   ABLA (acute blood loss anemia)   Need for emotional support   Acute urinary retention   Mixed hyperlipidemia   Essential hypertension   Diverticular  disease of left colon   History of DVT (deep vein thrombosis)   History of pulmonary embolus (PE)   Pre-diabetes   Presence of IVC filter   Stricture of sigmoid colon (HCC)   Protein-calorie malnutrition, severe   Hypokalemia   Complex surgical abdomen POD 1 ex-lap for lysis of adhesions Diverting Transverse loop colostomy G tube placement Rectal tube placement (for abscess drainage) Continue TPN wound VAC and management as per surgery   Intra-abdominal abscess Infectious disease on board.  Currently on  daptomycin , Zosyn , and micafungin .  Follow ID recommendations.   Atrial Fibrillation On IV Lopressor  and heparin  drip.   Severe protein calorie malnutrition Nutrition Status:Body mass index is 23.92 kg/m.  Nutrition Problem: Severe Malnutrition Etiology: chronic illness Signs/Symptoms: severe fat depletion, severe muscle depletion Interventions: Refer to RD note for recommendations, TPN.  Patient underwent gastrostomy tube placement.  Debility deconditioning patient frailty.  Continue PT.  PT has recommended skilled nursing facility placement.  DVT prophylaxis: SCD's Start: 05/08/24 1759   Disposition: As per PT, skilled nursing facility placement likely in several days.  Status is: Inpatient Remains inpatient appropriate because: Pending clinical improvement,    Code Status:     Code Status: Full Code  Family Communication: None at bedside  Consultants: General Surgery PCCM Infectious disease  Procedures: 5/29 elective colostomy takedown per Dr. Sheldon  6/10 Transgluteal drain placed per IR 6/17 CT guided drain placed into the abdominal and pelvic abscesses yielding pus and stool.   6/24 Lysis of adhesions, colostomy placement, drainage of abscess, and placement of G-tube,  6/25 extubated.  Anti-infectives:  Zosyn  IV Daptomycin  IV Micafungin  IV  Anti-infectives (From admission, onward)    Start     Dose/Rate Route Frequency Ordered Stop   06/04/24  1400  piperacillin -tazobactam (ZOSYN ) IVPB 3.375 g        3.375 g 12.5 mL/hr over 240 Minutes Intravenous Every 8 hours 06/04/24 0738 06/09/24 1359   06/04/24 1400  DAPTOmycin  (CUBICIN ) IVPB 500 mg/50mL premix        8 mg/kg  60.9 kg 100 mL/hr over 30 Minutes Intravenous Daily 06/04/24 0738 06/09/24 1359   06/03/24 1000  neomycin  (MYCIFRADIN ) tablet 500 mg  Status:  Discontinued        500 mg Oral 3 times daily 06/03/24 0935 06/03/24 1555   06/03/24 1000  metroNIDAZOLE  (FLAGYL ) tablet 500 mg  Status:  Discontinued       Note to Pharmacy: Take 2 pills (=1000mg ) by mouth at 1pm, 3pm, and 10pm the day before your colorectal operation   500 mg Oral 3 times daily 06/03/24 0935 06/03/24 1555   05/29/24 1500  DAPTOmycin  (CUBICIN ) IVPB 500 mg/50mL premix  Status:  Discontinued        8 mg/kg  60.9 kg 100 mL/hr over 30 Minutes Intravenous Daily 05/29/24 1359 06/04/24 0738   05/28/24 2100  vancomycin  (VANCOREADY) IVPB 750 mg/150 mL  Status:  Discontinued        750 mg 150 mL/hr over 60 Minutes Intravenous Every 12 hours 05/28/24 1419 05/29/24 1359   05/27/24 0800  vancomycin  (VANCOCIN ) IVPB 1000 mg/200 mL premix  Status:  Discontinued        1,000 mg 200 mL/hr over 60 Minutes Intravenous Every 24 hours 05/26/24 1144 05/28/24 1419   05/23/24 1400  piperacillin -tazobactam (ZOSYN ) IVPB 3.375 g  Status:  Discontinued        3.375 g 12.5 mL/hr over 240 Minutes Intravenous Every 8 hours 05/23/24 0804 06/04/24 0738   05/23/24 1200  micafungin  (MYCAMINE ) 100 mg in sodium chloride  0.9 % 100 mL IVPB        100 mg 105 mL/hr over 1 Hours Intravenous Every 24 hours 05/23/24 0755 06/06/24 1159   05/22/24 0800  vancomycin  (VANCOCIN ) IVPB 1000 mg/200 mL premix  Status:  Discontinued        1,000 mg 200 mL/hr over 60 Minutes Intravenous Every 24 hours 05/21/24 0634 05/26/24 0806   05/21/24 0730  vancomycin  (VANCOREADY) IVPB 1250 mg/250 mL        1,250 mg 166.7 mL/hr over 90 Minutes Intravenous  Once 05/21/24  0634 05/21/24 1043   05/19/24 1400  piperacillin -tazobactam (ZOSYN ) IVPB 3.375 g  Status:  Discontinued        3.375 g 12.5 mL/hr over 240 Minutes Intravenous Every 8 hours 05/19/24 1303 05/23/24 0804   05/09/24 0900  erythromycin  250 mg in sodium chloride  0.9 % 100 mL IVPB        250 mg 100 mL/hr over 60 Minutes Intravenous Every 8 hours 05/09/24 0732 05/11/24 0044   05/08/24 2200  cefoTEtan  (CEFOTAN ) 2 g in sodium chloride  0.9 % 100 mL IVPB        2 g 200 mL/hr over 30 Minutes Intravenous Every 12 hours 05/08/24 1759 05/09/24 0749   05/08/24 1400  neomycin  (MYCIFRADIN ) tablet 1,000 mg  Status:  Discontinued       Placed in And Linked Group   1,000 mg Oral 3 times per day 05/08/24 1119 05/08/24 1120   05/08/24 1400  metroNIDAZOLE  (FLAGYL ) tablet 1,000 mg  Status:  Discontinued       Placed in And  Linked Group   1,000 mg Oral 3 times per day 05/08/24 1119 05/08/24 1120   05/08/24 1130  cefoTEtan  (CEFOTAN ) 2 g in sodium chloride  0.9 % 100 mL IVPB        2 g 200 mL/hr over 30 Minutes Intravenous On call to O.R. 05/08/24 1119 05/09/24 0749      Subjective: Today, patient was seen and examined at bedside.  Nursing staff reported that the G-tube positioning might not be in place from the radiology report.  Patient mildly interactive.  Denies nausea or vomiting but complains of pain in the abdomen.  Objective: Vitals:   06/05/24 0226 06/05/24 0634  BP: (!) 144/64 125/68  Pulse: (!) 101 95  Resp: 19 20  Temp: 99.7 F (37.6 C) 98.7 F (37.1 C)  SpO2: 99% 100%    Intake/Output Summary (Last 24 hours) at 06/05/2024 1101 Last data filed at 06/04/2024 1720 Gross per 24 hour  Intake 1742.51 ml  Output 710 ml  Net 1032.51 ml   Filed Weights   06/03/24 0423 06/04/24 0500 06/05/24 0500  Weight: 61 kg 63.6 kg 65.2 kg   Body mass index is 23.92 kg/m.   Physical Exam: GENERAL: Patient is awake on verbal command, mildly somnolent, complains of pain, appears chronically ill  deconditioned and weak.  Nasal cannula in place HENT: No scleral pallor or icterus. Pupils equally reactive to light. Oral mucosa is moist NECK: is supple, no gross swelling noted. CHEST: Diminished breath sounds bilaterally. CVS: S1 and S2 heard, no murmur. Regular rate and rhythm.  ABDOMEN: Soft, gastrostomy tube in place, abdominal drain present.  Urethral catheter in place.  Colostomy right upper quadrant. EXTREMITIES: Upper extremity PICC line in place CNS: Cranial nerves are intact. No focal motor deficits. SKIN: warm and dry without rashes.  Data Review: I have personally reviewed the following laboratory data and studies,  CBC: Recent Labs  Lab 06/02/24 0255 06/04/24 1546 06/05/24 0429  WBC 9.0 12.4* 13.5*  HGB 9.0* 7.2* 7.1*  HCT 29.8* 23.8* 23.3*  MCV 92.3 89.8 95.5  PLT 424* 370 361   Basic Metabolic Panel: Recent Labs  Lab 06/02/24 0255 06/04/24 0609 06/05/24 0830  NA 132* 132* 138  K 3.9 3.9 4.0  CL 101 101 107  CO2 23 22 22   GLUCOSE 105* 151* 140*  BUN 18 23 21   CREATININE 0.71 0.77 0.55  CALCIUM  8.1* 7.7* 8.0*  MG 1.8 1.8 1.9  PHOS 3.3 2.8 1.9*   Liver Function Tests: Recent Labs  Lab 06/02/24 0255 06/04/24 0609 06/05/24 0830  AST 25 34 26  ALT 26 23 21   ALKPHOS 183* 129* 146*  BILITOT 0.9 1.2 1.3*  PROT 7.1 6.2* 6.4*  ALBUMIN  1.6* 1.7* 1.7*   No results for input(s): LIPASE, AMYLASE in the last 168 hours. No results for input(s): AMMONIA in the last 168 hours. Cardiac Enzymes: Recent Labs  Lab 05/30/24 0312  CKTOTAL 13*   BNP (last 3 results) No results for input(s): BNP in the last 8760 hours.  ProBNP (last 3 results) No results for input(s): PROBNP in the last 8760 hours.  CBG: Recent Labs  Lab 06/04/24 0737 06/04/24 1204 06/04/24 1612 06/04/24 2337 06/05/24 0523  GLUCAP 160* 163* 136* 135* 146*   Recent Results (from the past 240 hours)  Aerobic/Anaerobic Culture w Gram Stain (surgical/deep wound)     Status:  None   Collection Time: 05/27/24  5:19 PM   Specimen: Abscess  Result Value Ref Range Status  Specimen Description   Final    ABSCESS ABDOMEN Performed at [facility], [address] [place], [place] [address]    Special Requests   Final    NONE Performed at [facility], [address], [place], [place] [address]    Gram Stain   Final    ABUNDANT WBC PRESENT, PREDOMINANTLY PMN NO ORGANISMS SEEN    Culture   Final    RARE PSEUDOMONAS AERUGINOSA RARE CANDIDA ALBICANS NO ANAEROBES ISOLATED Performed at [facility], [address], [place], [place] [address]    Report Status 06/01/2024 FINAL  Final   Organism ID, Bacteria PSEUDOMONAS AERUGINOSA  Final      Susceptibility   Pseudomonas aeruginosa - MIC*    CEFTAZIDIME 4 SENSITIVE Sensitive     CIPROFLOXACIN <=0.25 SENSITIVE Sensitive     GENTAMICIN 4 SENSITIVE Sensitive     IMIPENEM 2 SENSITIVE Sensitive     PIP/TAZO <=4 SENSITIVE Sensitive ug/mL    CEFEPIME 2 SENSITIVE Sensitive     * RARE PSEUDOMONAS AERUGINOSA  Surgical pcr screen     Status: Abnormal   Collection Time: 06/02/24  7:55 PM   Specimen: Nasal Mucosa; Nasal Swab  Result Value Ref Range Status   MRSA, PCR POSITIVE (A) NEGATIVE Final   Staphylococcus aureus POSITIVE (A) NEGATIVE Final    Comment: (NOTE) The Xpert SA Assay (FDA approved for NASAL specimens in patients 72 years of age and older), is one component of a comprehensive surveillance program. It is not intended to diagnose infection nor to guide or monitor treatment. Performed at [facility], [address] [place], [place] [address]      Studies: DG CHEST PORT 1 VIEW Result Date: 06/03/2024 CLINICAL DATA:  Evaluation to confirm nasogastric and endotracheal tube placement. EXAM: PORTABLE CHEST 1 VIEW COMPARISON:  Chest radiograph 05/12/2024. FINDINGS: Endotracheal tube with tip overlying the trachea just below the level of the  clavicular heads approximately 5 cm above the carina. Endotracheal tube with tip overlying the left upper quadrant and side hole within approximately 1 cm of the GE junction. Redemonstrated right PICC terminating in the lower SVC. Lungs are well inflated. Mild prominence of the central pulmonary vasculature. Opacities over the medial right lung base. No pleural effusion or pneumothorax. Similar appearance of the cardiomediastinal silhouette. IVC filter partially visualized. IMPRESSION: Endotracheal tube tip just below the level of the clavicular heads. Enteric tube with side hole just below the GE junction, recommend advancement. Mild prominence of the central pulmonary vasculature without overt pulmonary edema. Electronically Signed   By: Donnice Mania M.D.   On: 06/03/2024 19:33      Vernal Alstrom, MD  [facility] 06/05/2024  If 7PM-7AM, please contact night-coverage

## 2024-06-06 DIAGNOSIS — B965 Pseudomonas (aeruginosa) (mallei) (pseudomallei) as the cause of diseases classified elsewhere: Secondary | ICD-10-CM | POA: Diagnosis not present

## 2024-06-06 DIAGNOSIS — B952 Enterococcus as the cause of diseases classified elsewhere: Secondary | ICD-10-CM | POA: Diagnosis not present

## 2024-06-06 DIAGNOSIS — T8143XA Infection following a procedure, organ and space surgical site, initial encounter: Secondary | ICD-10-CM

## 2024-06-06 DIAGNOSIS — B9562 Methicillin resistant Staphylococcus aureus infection as the cause of diseases classified elsewhere: Secondary | ICD-10-CM | POA: Diagnosis not present

## 2024-06-06 DIAGNOSIS — K5790 Diverticulosis of intestine, part unspecified, without perforation or abscess without bleeding: Secondary | ICD-10-CM | POA: Diagnosis not present

## 2024-06-06 DIAGNOSIS — B379 Candidiasis, unspecified: Secondary | ICD-10-CM | POA: Diagnosis not present

## 2024-06-06 LAB — GLUCOSE, CAPILLARY
Glucose-Capillary: 115 mg/dL — ABNORMAL HIGH (ref 70–99)
Glucose-Capillary: 149 mg/dL — ABNORMAL HIGH (ref 70–99)
Glucose-Capillary: 125 mg/dL — ABNORMAL HIGH (ref 70–99)

## 2024-06-06 LAB — BASIC METABOLIC PANEL WITH GFR
Anion gap: 9 (ref 5–15)
BUN: 21 mg/dL (ref 8–23)
CO2: 22 mmol/L (ref 22–32)
Calcium: 8 mg/dL — ABNORMAL LOW (ref 8.9–10.3)
Chloride: 104 mmol/L (ref 98–111)
Creatinine, Ser: 0.61 mg/dL (ref 0.44–1.00)
GFR, Estimated: >60 mL/min (ref >60)
Glucose, Bld: 121 mg/dL — ABNORMAL HIGH (ref 70–99)
Potassium: 3.8 mmol/L (ref 3.5–5.1)
Sodium: 135 mmol/L (ref 135–145)

## 2024-06-06 LAB — CBC
HCT: 25.8 % — ABNORMAL LOW (ref 36.0–46.0)
Hemoglobin: 8 g/dL — ABNORMAL LOW (ref 12.0–15.0)
MCH: 28.1 pg (ref 26.0–34.0)
MCHC: 31 g/dL (ref 30.0–36.0)
MCV: 90.5 fL (ref 80.0–100.0)
Platelets: 353 10*3/uL (ref 150–400)
RBC: 2.85 MIL/uL — ABNORMAL LOW (ref 3.87–5.11)
RDW: 16 % — ABNORMAL HIGH (ref 11.5–15.5)
WBC: 11.7 10*3/uL — ABNORMAL HIGH (ref 4.0–10.5)
nRBC: 0.2 % (ref 0.0–0.2)

## 2024-06-06 LAB — MAGNESIUM: Magnesium: 1.9 mg/dL (ref 1.7–2.4)

## 2024-06-06 LAB — HEPARIN LEVEL (UNFRACTIONATED)
Heparin Unfractionated: 0.29 [IU]/mL — ABNORMAL LOW (ref 0.30–0.70)
Heparin Unfractionated: 0.37 [IU]/mL (ref 0.30–0.70)
Heparin Unfractionated: 0.34 [IU]/mL (ref 0.30–0.70)

## 2024-06-06 LAB — TYPE AND SCREEN
ABO/RH(D): A POS
ABO/RH(D): A POS
Antibody Screen: NEGATIVE
Antibody Screen: NEGATIVE
Unit division: 0

## 2024-06-06 LAB — BPAM RBC
Blood Product Expiration Date: 202507192359
ISSUE DATE / TIME: 202506261439
Unit Type and Rh: 6200

## 2024-06-06 LAB — PHOSPHORUS: Phosphorus: 2.2 mg/dL — ABNORMAL LOW (ref 2.5–4.6)

## 2024-06-06 MED ORDER — TRAVASOL 10 % IV SOLN
INTRAVENOUS | Status: AC
  Filled 2024-06-06: qty 940.8

## 2024-06-06 MED ORDER — FENTANYL CITRATE PF 50 MCG/ML IJ SOSY
12.5000 ug | PREFILLED_SYRINGE | Freq: Once | INTRAMUSCULAR | Status: AC
  Administered 2024-06-06: 12.5 ug via INTRAVENOUS
  Filled 2024-06-06: qty 1

## 2024-06-06 MED ORDER — POTASSIUM PHOSPHATES 15 MMOLE/5ML IV SOLN
20.0000 mmol | Freq: Once | INTRAVENOUS | Status: AC
  Administered 2024-06-06: 20 mmol via INTRAVENOUS
  Filled 2024-06-06: qty 6.67

## 2024-06-06 MED ORDER — FENTANYL CITRATE PF 50 MCG/ML IJ SOSY
12.5000 ug | PREFILLED_SYRINGE | INTRAMUSCULAR | Status: DC | PRN
  Administered 2024-06-06 – 2024-06-07 (×6): 25 ug via INTRAVENOUS
  Filled 2024-06-06 (×6): qty 1

## 2024-06-06 MED ORDER — MAGNESIUM SULFATE 4 GM/100ML IV SOLN
4.0000 g | Freq: Once | INTRAVENOUS | Status: AC
  Administered 2024-06-06: 4 g via INTRAVENOUS
  Filled 2024-06-06: qty 100

## 2024-06-06 NOTE — Progress Notes (Signed)
 PROGRESS NOTE  Elizabeth Odom FMW:[id number] DOB: October 27, 1941 DOA: 05/08/2024 PCP: Jesus Bernardino MATSU, MD   LOS: 29 days   Brief narrative:  Elizabeth Odom is a 83 y.o. female with a history of atrial fibrillation, DVT, PE, anemia, PACs, hypertension, mild cognitive impairment, history of large bowel obstruction status post colonic resection and colostomy creation last year.  Her clinical course was complicated by anastomotic leak in presented last month for a colostomy resection and takedown but had complicated postop course with dense adhesions.  Patient also had atrial fibrillation with RVR polypharmacy and altered mental status.  At this time patient has been in the hospital for intra-abdominal abscess with polymicrobial growth and has been having poor oral intake and on TPN.  Patient was taken back to the OR on 625 for lysis of adhesions colostomy placement and drainage of abscess.  Rectal tube was placed thin for drainage.  Patient also had G-tube placed in for nutrition.  Intraoperatively patient had hypotension requiring vasopressors but this resolved and patient has been extubated as well.  At this time, patient has been considered for transfer out of the ICU  Sequence of events. 5/29 presented for elective colostomy takedown per Dr. Sheldon  6/10 Transgluteal drain placed per IR 6/17 CT guided drain placed into the abdominal and pelvic abscesses yielding pus and stool.   6/24 Back to the OR with Dr. Sheldon for lysis of adhesions, colostomy placement, drainage of abscess, and placement of G-tube, Intraoperatively she had episode of hypotension requiring vasopressors immediately upon insufflation to the abdomen. 6/25 extubated.  Assessment/Plan: Principal Problem:   Diverticular disease Active Problems:   A-fib (HCC)   Restless leg   ABLA (acute blood loss anemia)   Need for emotional support   Acute urinary retention   Mixed hyperlipidemia   Essential hypertension   Diverticular  disease of left colon   History of DVT (deep vein thrombosis)   History of pulmonary embolus (PE)   Pre-diabetes   Presence of IVC filter   Stricture of sigmoid colon (HCC)   Protein-calorie malnutrition, severe   Hypokalemia   Complex surgical abdomen POD 1 ex-lap for lysis of adhesions Diverting Transverse loop colostomy G tube placement Rectal tube placement (for abscess drainage) Continue TPN wound VAC and management as per surgery.  On G-tube with intermittent wall suction.   Intra-abdominal abscess Infectious disease on board.  Currently on  daptomycin , Zosyn , and micafungin .  Follow ID recommendations.   Atrial Fibrillation On IV Lopressor  and heparin  drip.  Overall rate controlled.   Severe protein calorie malnutrition Nutrition Status:Body mass index is 23.92 kg/m.  Nutrition Problem: Severe Malnutrition Etiology: chronic illness Signs/Symptoms: severe fat depletion, severe muscle depletion Interventions: Refer to RD note for recommendations, TPN.  Patient underwent gastrostomy tube placement.  Debility deconditioning patient frailty.  Continue PT.  PT has recommended skilled nursing facility placement.  Overall prognosis of the patient appears to be guarded.  DVT prophylaxis: SCD's Start: 05/08/24 1759   Disposition: As per PT, skilled nursing facility placement, likely in several days.  Status is: Inpatient  Remains inpatient appropriate because: Pending clinical improvement,   Code Status:     Code Status: Full Code  Family Communication: Spoke with the patient's granddaughter on the phone and updated her about the clinical condition of the patient.  Overall guarded prognosis.  Consultants: General Surgery PCCM Infectious disease  Procedures: 5/29 elective colostomy takedown per Dr. Sheldon  6/10 Transgluteal drain placed per IR 6/17 CT guided  drain placed into the abdominal and pelvic abscesses yielding pus and stool.   6/24 Lysis of adhesions,  colostomy placement, drainage of abscess, and placement of G-tube,  6/25 extubated.  Anti-infectives:  Zosyn  IV Daptomycin  IV Micafungin  IV  Anti-infectives (From admission, onward)    Start     Dose/Rate Route Frequency Ordered Stop   06/06/24 1000  micafungin  (MYCAMINE ) 100 mg in sodium chloride  0.9 % 100 mL IVPB        100 mg 105 mL/hr over 1 Hours Intravenous Every 24 hours 06/05/24 1349 06/20/24 0959   06/04/24 1400  piperacillin -tazobactam (ZOSYN ) IVPB 3.375 g        3.375 g 12.5 mL/hr over 240 Minutes Intravenous Every 8 hours 06/04/24 0738 06/09/24 1359   06/04/24 1400  DAPTOmycin  (CUBICIN ) IVPB 500 mg/70mL premix        8 mg/kg  60.9 kg 100 mL/hr over 30 Minutes Intravenous Daily 06/04/24 0738 06/09/24 1359   06/03/24 1000  neomycin  (MYCIFRADIN ) tablet 500 mg  Status:  Discontinued        500 mg Oral 3 times daily 06/03/24 0935 06/03/24 1555   06/03/24 1000  metroNIDAZOLE  (FLAGYL ) tablet 500 mg  Status:  Discontinued       Note to Pharmacy: Take 2 pills (=1000mg ) by mouth at 1pm, 3pm, and 10pm the day before your colorectal operation   500 mg Oral 3 times daily 06/03/24 0935 06/03/24 1555   05/29/24 1500  DAPTOmycin  (CUBICIN ) IVPB 500 mg/50mL premix  Status:  Discontinued        8 mg/kg  60.9 kg 100 mL/hr over 30 Minutes Intravenous Daily 05/29/24 1359 06/04/24 0738   05/28/24 2100  vancomycin  (VANCOREADY) IVPB 750 mg/150 mL  Status:  Discontinued        750 mg 150 mL/hr over 60 Minutes Intravenous Every 12 hours 05/28/24 1419 05/29/24 1359   05/27/24 0800  vancomycin  (VANCOCIN ) IVPB 1000 mg/200 mL premix  Status:  Discontinued        1,000 mg 200 mL/hr over 60 Minutes Intravenous Every 24 hours 05/26/24 1144 05/28/24 1419   05/23/24 1400  piperacillin -tazobactam (ZOSYN ) IVPB 3.375 g  Status:  Discontinued        3.375 g 12.5 mL/hr over 240 Minutes Intravenous Every 8 hours 05/23/24 0804 06/04/24 0738   05/23/24 1200  micafungin  (MYCAMINE ) 100 mg in sodium chloride   0.9 % 100 mL IVPB        100 mg 105 mL/hr over 1 Hours Intravenous Every 24 hours 05/23/24 0755 06/05/24 1221   05/22/24 0800  vancomycin  (VANCOCIN ) IVPB 1000 mg/200 mL premix  Status:  Discontinued        1,000 mg 200 mL/hr over 60 Minutes Intravenous Every 24 hours 05/21/24 0634 05/26/24 0806   05/21/24 0730  vancomycin  (VANCOREADY) IVPB 1250 mg/250 mL        1,250 mg 166.7 mL/hr over 90 Minutes Intravenous  Once 05/21/24 0634 05/21/24 1043   05/19/24 1400  piperacillin -tazobactam (ZOSYN ) IVPB 3.375 g  Status:  Discontinued        3.375 g 12.5 mL/hr over 240 Minutes Intravenous Every 8 hours 05/19/24 1303 05/23/24 0804   05/09/24 0900  erythromycin  250 mg in sodium chloride  0.9 % 100 mL IVPB        250 mg 100 mL/hr over 60 Minutes Intravenous Every 8 hours 05/09/24 0732 05/11/24 0044   05/08/24 2200  cefoTEtan  (CEFOTAN ) 2 g in sodium chloride  0.9 % 100 mL IVPB  2 g 200 mL/hr over 30 Minutes Intravenous Every 12 hours 05/08/24 1759 05/09/24 0749   05/08/24 1400  neomycin  (MYCIFRADIN ) tablet 1,000 mg  Status:  Discontinued       Placed in And Linked Group   1,000 mg Oral 3 times per day 05/08/24 1119 05/08/24 1120   05/08/24 1400  metroNIDAZOLE  (FLAGYL ) tablet 1,000 mg  Status:  Discontinued       Placed in And Linked Group   1,000 mg Oral 3 times per day 05/08/24 1119 05/08/24 1120   05/08/24 1130  cefoTEtan  (CEFOTAN ) 2 g in sodium chloride  0.9 % 100 mL IVPB        2 g 200 mL/hr over 30 Minutes Intravenous On call to O.R. 05/08/24 1119 05/09/24 0749      Subjective:  Today, patient was seen and examined at bedside.  Patient complains of abdominal pain.  Denies any nausea vomiting fever chills or rigor.  Objective: Vitals:   06/06/24 0513 06/06/24 1234  BP: (!) 155/91 (!) 149/90  Pulse: (!) 102 81  Resp: 18 16  Temp: 98.7 F (37.1 C) 98 F (36.7 C)  SpO2: 100% 100%    Intake/Output Summary (Last 24 hours) at 06/06/2024 1351 Last data filed at 06/05/2024  1814 Gross per 24 hour  Intake 3300.2 ml  Output 80 ml  Net 3220.2 ml   Filed Weights   06/04/24 0500 06/05/24 0500 06/06/24 0500  Weight: 63.6 kg 65.2 kg 65.2 kg   Body mass index is 23.92 kg/m.   Physical Exam: GENERAL: Patient is awake on verbal command, planes of pain chronically ill deconditioned and weak.   Nasal cannula in place HENT: No scleral pallor or icterus. Pupils equally reactive to light. Oral mucosa is moist NECK: is supple, no gross swelling noted. CHEST: Diminished breath sounds bilaterally. CVS: S1 and S2 heard, no murmur. Regular rate and rhythm.  ABDOMEN: Soft, gastrostomy tube in place, abdominal drain present.  Urethral catheter in place.  Colostomy right upper quadrant. EXTREMITIES: Right upper extremity PICC line in place CNS: Communicative.  Moves extremities but generalized weakness noted. SKIN: warm and dry without rashes.  Abdominal incision with dressing  Data Review: I have personally reviewed the following laboratory data and studies,  CBC: Recent Labs  Lab 06/02/24 0255 06/04/24 1546 06/05/24 0429 06/06/24 0346  WBC 9.0 12.4* 13.5* 11.7*  HGB 9.0* 7.2* 7.1* 8.0*  HCT 29.8* 23.8* 23.3* 25.8*  MCV 92.3 89.8 95.5 90.5  PLT 424* 370 361 353   Basic Metabolic Panel: Recent Labs  Lab 06/02/24 0255 06/04/24 0609 06/05/24 0830 06/06/24 0346  NA 132* 132* 138 135  K 3.9 3.9 4.0 3.8  CL 101 101 107 104  CO2 23 22 22 22   GLUCOSE 105* 151* 140* 121*  BUN 18 23 21 21   CREATININE 0.71 0.77 0.55 0.61  CALCIUM  8.1* 7.7* 8.0* 8.0*  MG 1.8 1.8 1.9 1.9  PHOS 3.3 2.8 1.9* 2.2*   Liver Function Tests: Recent Labs  Lab 06/02/24 0255 06/04/24 0609 06/05/24 0830  AST 25 34 26  ALT 26 23 21   ALKPHOS 183* 129* 146*  BILITOT 0.9 1.2 1.3*  PROT 7.1 6.2* 6.4*  ALBUMIN  1.6* 1.7* 1.7*   No results for input(s): LIPASE, AMYLASE in the last 168 hours. No results for input(s): AMMONIA in the last 168 hours. Cardiac Enzymes: No results for  input(s): CKTOTAL, CKMB, CKMBINDEX, TROPONINI in the last 168 hours.  BNP (last 3 results) No results for input(s): BNP  in the last 8760 hours.  ProBNP (last 3 results) No results for input(s): PROBNP in the last 8760 hours.  CBG: Recent Labs  Lab 06/05/24 1151 06/05/24 1750 06/05/24 2353 06/06/24 0609 06/06/24 1132  GLUCAP 152* 112* 124* 115* 149*   Recent Results (from the past 240 hours)  Aerobic/Anaerobic Culture w Gram Stain (surgical/deep wound)     Status: None   Collection Time: 05/27/24  5:19 PM   Specimen: Abscess  Result Value Ref Range Status   Specimen Description   Final    ABSCESS ABDOMEN Performed at [facility], [address] [place], [place] [address]    Special Requests   Final    NONE Performed at [facility], [address] [place], [place] [address]    Gram Stain   Final    ABUNDANT WBC PRESENT, PREDOMINANTLY PMN NO ORGANISMS SEEN    Culture   Final    RARE PSEUDOMONAS AERUGINOSA RARE CANDIDA ALBICANS NO ANAEROBES ISOLATED Performed at [facility], [address], [place], [place] [address]    Report Status 06/01/2024 FINAL  Final   Organism ID, Bacteria PSEUDOMONAS AERUGINOSA  Final      Susceptibility   Pseudomonas aeruginosa - MIC*    CEFTAZIDIME 4 SENSITIVE Sensitive     CIPROFLOXACIN <=0.25 SENSITIVE Sensitive     GENTAMICIN 4 SENSITIVE Sensitive     IMIPENEM 2 SENSITIVE Sensitive     PIP/TAZO <=4 SENSITIVE Sensitive ug/mL    CEFEPIME 2 SENSITIVE Sensitive     * RARE PSEUDOMONAS AERUGINOSA  Surgical pcr screen     Status: Abnormal   Collection Time: 06/02/24  7:55 PM   Specimen: Nasal Mucosa; Nasal Swab  Result Value Ref Range Status   MRSA, PCR POSITIVE (A) NEGATIVE Final   Staphylococcus aureus POSITIVE (A) NEGATIVE Final    Comment: (NOTE) The Xpert SA Assay (FDA approved for NASAL specimens in patients 42 years of age and older), is one component of a  comprehensive surveillance program. It is not intended to diagnose infection nor to guide or monitor treatment. Performed at [facility], [address], [place], [place] [address]      Studies: No results found.     Elon Eoff, MD  [facility] 06/06/2024  If 7PM-7AM, please contact night-coverage

## 2024-06-06 NOTE — Plan of Care (Signed)
  Problem: Activity: Goal: Risk for activity intolerance will decrease Outcome: Progressing   Problem: Pain Managment: Goal: General experience of comfort will improve and/or be controlled Outcome: Progressing   Problem: Safety: Goal: Ability to remain free from injury will improve Outcome: Progressing   Problem: Coping: Goal: Ability to adjust to condition or change in health will improve Outcome: Progressing

## 2024-06-06 NOTE — Progress Notes (Signed)
 PHARMACY - TOTAL PARENTERAL NUTRITION CONSULT NOTE   Indication: Prolonged ileus  Patient Measurements: Height: 5' 5 (165.1 cm) Weight: 65.2 kg (143 lb 11.8 oz) IBW/kg (Calculated) : 57 TPN AdjBW (KG): 61.3 Body mass index is 23.92 kg/m.  Assessment: 64 yoF with history of diverticulitis with perforation and abscess. 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 She requested ostomy takedown and underwent LOA, colostomy takedown, small bowel resection with anastomosis on 5/29. Pharmacy is consulted to dose TPN starting 6/2 for postop ileus.  Post-op course complicated by anastomotic leak, multiple-intraabdominal abscesses.  Glucose / Insulin : no Hx DM, A1c 5.1%. BG goal <180 -CBG/SSI discontinued on 6/11, 6/25>> -BG 112-152 - SSI: 4 units/24h  Electrolytes: Na 135 on max Na concentration -Phos 1.9>2.2 still  low-replace, CoCa 9.8 , Mg 1.9 (suppl ordered by MD) Hepatic: LFTs, Tbili up to 1.3 today,  -Albumin  low but stable. Alk Phos elevated -TG 150 (6/25) I/O: no mIVF -UOP: 450 mL, NG:0 mL, drain:80 mL, LBM 6/26, rectal tube out.  GI Imaging:  -6/16 CT: Similar appearance at the rectal anastomosis concerning for dehiscence -6/9 CT: Dempsey dehiscence along the rectal anastomosis, free air and multiple fluid collections - 6/23: poss catheter occlusion of PERC drain, several fistulous of sigmoid colon, gas collection, rectal anastomosis with dehiscence cavity communicating to small bowel, Multiple additional small loculated fluid collection at new percutaneous drainage catheter site. Additional small loculated fluid collection small bowel mesentery.  GI Surgeries / Procedures: -5/29: LAR, SBR, colostomy takedown, LOA -6/10 drains placed x 2 in IR (midline abd, transgluteal) -6/17: Replacement of drainage catheter by IR -6/24: diverting loop transverse colostomy, I&D  of abdominal abscess, gastrotomy tube placement, rectal tube placement.   Central access: Double lumen PICC placed 6/2 TPN start date: 6/2  Nutritional Goals: Goal TPN rate is 70 mL/hr (provides 94 g of protein and 1680 kcals per day)  RD Assessment:  Estimated Needs Total Energy Estimated Needs: 1600-1750 kcals Total Protein Estimated Needs: 80-95 grams Total Fluid Estimated Needs: >/= 1.6L  Current Nutrition:  -TPN at goal rate 50ml/hr  -Diet: NPO except for ice chips Multiple calorie counts this admission. Not meeting caloric needs. Refusing all supplements. Pt underwent G-tube placement 6/24.   Plan:  D/C TF, not tolerating. G tube to suction. Try sips Kphos 20mmol IV x 1 again today Magnesium  4g IV x 1 per MD Continue TPN at goal rate of 70 mL/hr Resume MV in TPN for now Electrolytes in TPN: No change Na 150 mEq/L (max) K 50 mEq/L Ca 5 mEq/L Mg 5 mEq/L Phos 15 mmol/L Cl:Ac 1:1 Resumed sSSI q6h Monitor TPN labs on Mon/Thurs, and as needed.  Casimir Camelia Ours, PharmD, BCPS Clinical Pharmacist 06/06/2024 8:51 AM

## 2024-06-06 NOTE — Progress Notes (Signed)
 PHARMACY - ANTICOAGULATION CONSULT NOTE  Pharmacy Consult for heparin  Indication: atrial fibrillation (apixaban  on hold)  Allergies  Allergen Reactions   Elemental Sulfur Anaphylaxis    Patient Measurements: Height: 5' 5 (165.1 cm) Weight: 65.2 kg (143 lb 11.8 oz) IBW/kg (Calculated) : 57 HEPARIN  DW (KG): 61.3  Vital Signs: Temp: 97.8 F (36.6 C) (06/26 1951) Temp Source: Oral (06/26 1951) BP: 145/78 (06/26 1951) Pulse Rate: 84 (06/26 1951)  Labs: Recent Labs    06/04/24 0609 06/04/24 1522 06/04/24 1523 06/04/24 1546 06/05/24 0429 06/05/24 0830 06/05/24 1530 06/06/24 0346  HGB  --   --    < > 7.2* 7.1*  --   --  8.0*  HCT  --   --   --  23.8* 23.3*  --   --  25.8*  PLT  --   --   --  370 361  --   --  353  APTT  --  41*  --   --  40*  --   --   --   HEPARINUNFRC  --   --    < >  --  0.19*  --  0.16* 0.29*  CREATININE 0.77  --   --   --   --  0.55  --  0.61   < > = values in this interval not displayed.    Estimated Creatinine Clearance: 47.9 mL/min (by C-G formula based on SCr of 0.61 mg/dL).   Medical History: Past Medical History:  Diagnosis Date   A-fib (HCC) 04/09/2016   Acute head injury 04/09/2016   Anemia    Blood dyscrasia    Hx DVT / PE   Cervical cancer (HCC)    Cervical spine fracture, initial encounter 04/09/2016   Claustrophobia    extremely claustrophobic   Dupuytren disease of palm of both hands    Essential hypertension    Fatty liver    History of kidney stones    MCI (mild cognitive impairment) with memory loss    Multiple fractures of cervical spine (HCC) 04/09/2016   Restless leg    Syncope and collapse 04/09/2016    Medications: Pt was not on anticoagulants PTA, however, apixaban  was started during admission for atrial fibrillation.  -Apixaban  2.5 mg PO BID, last dose given 6/23 @ 21:30  Assessment: Pt is an 42 yoF who was started on apixaban  during this admission for atrial fibrillation. Pt underwent surgery on 6/24 -  placement of G-tube, I&D of abscess, diverting colostomy. CCS recommending heparin  drip post-op until pt having BM.   Pharmacy consulted to dose heparin  for afib while apixaban  on hold. Baseline labs ordered STAT (heparin  level, aPTT, CBC).   PM, 06/06/24 HL 0.29 subtherapeutic on 1400 units/hr Hgb 8, plts 353  no bleeding or issues noted  Goal of Therapy:  Heparin  level 0.3-0.7 units/ml aPTT 66-102 seconds Monitor platelets by anticoagulation protocol: Yes   Plan:  Increase heparin  drip to 1550 units/hr Recheck heparin  level in 8 hours CBC, heparin  level daily Monitor for signs of bleeding Follow for ability to resume apixaban    Leeroy Mace RPh 06/06/2024, 4:43 AM

## 2024-06-06 NOTE — Progress Notes (Signed)
 SLP Cancellation Note  Patient Details Name: Elizabeth Odom MRN: [id number] DOB: [birth year]   Cancelled treatment:        Spoke with RN who stated not to give pt ice water  or ice chips because she just gave her some. Pt is limited to 1 ml per hour per Dr Sheldon (as documented by other SLP). RN also states the ice hurts her stomach and then pt had to have pain meds. Will plan to follow up next week.    Dustin Olam Bull 06/06/2024, 12:36 PM

## 2024-06-06 NOTE — Progress Notes (Signed)
 PHARMACY - ANTICOAGULATION CONSULT NOTE  Pharmacy Consult for Heparin  Indication: atrial fibrillation  Allergies  Allergen Reactions   Elemental Sulfur Anaphylaxis    Patient Measurements: Height: 5' 5 (165.1 cm) Weight: 65.2 kg (143 lb 11.8 oz) IBW/kg (Calculated) : 57 HEPARIN  DW (KG): 61.3  Vital Signs: Temp: 98 F (36.7 C) (06/27 1234) Temp Source: Oral (06/27 0513) BP: 149/90 (06/27 1234) Pulse Rate: 81 (06/27 1234)  Labs: Recent Labs    06/04/24 0609 06/04/24 1522 06/04/24 1523 06/04/24 1546 06/05/24 0429 06/05/24 0830 06/05/24 1530 06/06/24 0346 06/06/24 1340  HGB  --   --    < > 7.2* 7.1*  --   --  8.0*  --   HCT  --   --   --  23.8* 23.3*  --   --  25.8*  --   PLT  --   --   --  370 361  --   --  353  --   APTT  --  41*  --   --  40*  --   --   --   --   HEPARINUNFRC  --   --    < >  --  0.19*  --  0.16* 0.29* 0.37  CREATININE 0.77  --   --   --   --  0.55  --  0.61  --    < > = values in this interval not displayed.    Estimated Creatinine Clearance: 47.9 mL/min (by C-G formula based on SCr of 0.61 mg/dL).   Medical History: Past Medical History:  Diagnosis Date   A-fib (HCC) 04/09/2016   Acute head injury 04/09/2016   Anemia    Blood dyscrasia    Hx DVT / PE   Cervical cancer (HCC)    Cervical spine fracture, initial encounter 04/09/2016   Claustrophobia    extremely claustrophobic   Dupuytren disease of palm of both hands    Essential hypertension    Fatty liver    History of kidney stones    MCI (mild cognitive impairment) with memory loss    Multiple fractures of cervical spine (HCC) 04/09/2016   Restless leg    Syncope and collapse 04/09/2016    Assessment: AC/Heme: UFH post-op (DOAC held) -Eliquis  2.5 BID (new) -educ complete - Hgb 7.1>transfused 6/26>Hgb 8 today. Plts WNL - Hep level 0.37 now in goal  Goal of Therapy:  Heparin  level 0.3-0.7 units/ml Monitor platelets by anticoagulation protocol: Yes   Plan:  Cont' IV  heparin  at 1550 units/hr Confirm HL in 6 hrs. Daily HL and CBC   Lance Galas Karoline Marina, PharmD, BCPS Clinical Staff Pharmacist Marina Salines Stillinger 06/06/2024,2:14 PM

## 2024-06-06 NOTE — Progress Notes (Addendum)
 [facility]  Date of Admission:  05/08/2024   Total days of inpatient antibiotics 30  Principal Problem:   Diverticular disease Active Problems:   A-fib (HCC)   Restless leg   ABLA (acute blood loss anemia)   Need for emotional support   Acute urinary retention   Mixed hyperlipidemia   Essential hypertension   Diverticular disease of left colon   History of DVT (deep vein thrombosis)   History of pulmonary embolus (PE)   Pre-diabetes   Presence of IVC filter   Stricture of sigmoid colon (HCC)   Protein-calorie malnutrition, severe   Hypokalemia          Assessment:  83 year old female with past medical history of large bowel resection status post Hartmann resection and colostomy creation in October 2020 for complicated, anastomotic leak, A-fib not on anticoagulation, cervical cancer, syncopal episode admitted for colostomy takedown found to have:  #Post op intra-abdominal abscesses, 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 [facility] course was complicated by paroxysmal A-fib, acute blood loss requiring packed red blood cells.  Found to have postop intra-abdominal abscesses as well.  She initially had percutaneous drain placement with IR on 6/10, no cultures obtained.  Cultures from drain were polymicrobial on 6/12 growing Pseudomonas aeruginosa, MRSA, Candida albicans, E faecalis.  Patient has been on pip-tazo, micafungin , vancomycin . - She underwent a second set of drain placement on 6/17 as transgluteal drain became dislodged.  Cultures obtained from that day.  ID engaged for antibiotic recommendations. -Patient has been on daptomycin , pip-tazo  and micafungin .  Repeat cultures from 6/17 grew Pseudomonas aeruginosa and Candida albicans. - Patient has CT abdomen pelvis on 6/23 which showed questionable catheter occlusion of PERC drain via left transgluteal route given debris's persisted.  Several fistulous and sigmoid colon, one of them communicating with 5.8 X5.2X 10.7 gas collection, rectal anastomosis with dehiscence cavity communicating to small bowel within right lower quadrant.  Multiple additional small loculated fluid collection at new percutaneous drainage catheter site.  Additional small loculated fluid collection small bowel mesentery. - Taken to the OR on 6/24 for laparotomy with diverting loop transverse colostomy, I&D of abdominal abscess, gastrotomy tube placement, rectal tube placement.  Or findings noted very dense concreted intraperitoneal and pelvic adhesions.  Delayed ana anastomotic leak.  Near dissection colorectal anastomosis.  Rectal tube was placed through rectal stump to drain pelvis more directly.  Old drains removed. - Patient not extubated. Recommendations:  - Continue daptomycin , pip-tazo, micafungin .  -Given that patient multiple possible abscesses, extensive surgery.  Would recommend reimaging prior to stopping antibiotics. General surgery notes source control, consider ct scan day 5 which is 6/29. Agree with repeat imaging.  - General Surgery noted that most likely she will need a diversion to be able to heal, patient not willing to do this currently. -Contact precautions   Dr. Dea is covering this weekend. New ID team on Monday.  Microbiology:   Antibiotics: Cefotan  10//20 Erythromycin  05/09/1930 Pip-tazo 6/9-present Vancomycin  6/12-present Micafungin  6/13-present  Lines: GERSHON Gostoro Colostomy JP abdominal Rectal  Cultures:  Blood  Urine  Other 6/17 pending 6/12 Pseudomonas aeruginosa, MRSA, Candida albicans, E faecalis     SUBJECTIVE: Resting in bed, more  tired today.  Interval:  Afebrile overnight. Wbc 11.7k.    Review of Systems: Review of Systems  All other systems reviewed and are negative.    Scheduled Meds:  Chlorhexidine  Gluconate Cloth  6 each Topical Daily   insulin  aspart  0-9 Units Subcutaneous Q6H   mupirocin  ointment  1 Application Nasal BID   nystatin    Topical BID   mouth rinse  15 mL Mouth Rinse 4 times per day   pantoprazole  (PROTONIX ) IV  40 mg Intravenous Daily   sodium chloride  flush  10-40 mL Intracatheter Q12H   sodium chloride  flush  3 mL Intravenous Q12H   Continuous Infusions:  sodium chloride  10 mL/hr at 05/26/24 2028   DAPTOmycin  500 mg (06/06/24 1318)   heparin  1,550 Units/hr (06/06/24 1158)   iron  sucrose 440 mL/hr at 06/05/24 1814   micafungin  (MYCAMINE ) 100 mg in sodium chloride  0.9 % 100 mL IVPB 100 mg (06/06/24 0921)   piperacillin -tazobactam (ZOSYN )  IV 3.375 g (06/06/24 1413)   potassium PHOSPHATE  IVPB (in mmol) 20 mmol (06/06/24 0845)   TPN ADULT (ION) 70 mL/hr at 06/05/24 1814   TPN ADULT (ION)     PRN Meds:.sodium chloride , alum & mag hydroxide-simeth, fentaNYL  (SUBLIMAZE ) injection, hydrALAZINE , lip balm, menthol -cetylpyridinium, metoprolol  tartrate, naphazoline-glycerin , [DISCONTINUED] ondansetron  **OR** ondansetron  (ZOFRAN ) IV, phenol, [DISCONTINUED] prochlorperazine  **OR** prochlorperazine , simethicone , sodium chloride , sodium chloride  flush, witch hazel-glycerin  Allergies  Allergen Reactions   Elemental Sulfur Anaphylaxis    OBJECTIVE: Vitals:   06/05/24 1951 06/06/24 0500 06/06/24 0513 06/06/24 1234  BP: (!) 145/78  (!) 155/91 (!) 149/90  Pulse: 84  (!) 102 81  Resp: 17  18 16   Temp: 97.8 F (36.6 C)  98.7 F (37.1 C) 98 F (36.7 C)  TempSrc: Oral  Oral   SpO2: 100%  100% 100%  Weight:  65.2 kg    Height:       Body mass index is 23.92 kg/m.  Physical Exam Constitutional:      Appearance: Normal appearance.  HENT:     Head: Normocephalic and atraumatic.     Right Ear: Tympanic membrane  normal.     Left Ear: Tympanic membrane normal.     Nose: Nose normal.     Mouth/Throat:     Mouth: Mucous membranes are moist.   Eyes:     Extraocular Movements: Extraocular movements intact.     Conjunctiva/sclera: Conjunctivae normal.     Pupils: Pupils are equal, round, and reactive to light.    Cardiovascular:     Rate and Rhythm: Normal rate and regular rhythm.     Heart sounds: No murmur heard.    No friction rub. No gallop.  Pulmonary:     Effort: Pulmonary effort is normal.     Breath sounds: Normal breath sounds.  Abdominal:     Comments: Post op wounds, drains   Skin:    General: Skin is warm and dry.   Neurological:     General: No focal deficit present.     Mental Status: She is alert and oriented to person, place, and time.   Psychiatric:        Mood and Affect: Mood normal.       Lab Results Lab Results  Component Value Date   WBC 11.7 (H) 06/06/2024   HGB 8.0 (L) 06/06/2024   HCT 25.8 (L) 06/06/2024   MCV 90.5 06/06/2024   PLT 353 06/06/2024    Lab Results  Component Value Date   CREATININE 0.61 06/06/2024  BUN 21 06/06/2024   NA 135 06/06/2024   K 3.8 06/06/2024   CL 104 06/06/2024   CO2 22 06/06/2024    Lab Results  Component Value Date   ALT 21 06/05/2024   AST 26 06/05/2024   ALKPHOS 146 (H) 06/05/2024   BILITOT 1.3 (H) 06/05/2024        Loney Stank, MD [facility] 06/06/2024, 2:43 PM Evaluation of this patient requires complex antimicrobial therapy evaluation and counseling + isolation needs for disease transmission risk assessment and mitigation

## 2024-06-06 NOTE — Plan of Care (Signed)
  Problem: Education: Goal: Verbalization of understanding of the causes of altered bowel function will improve Outcome: Progressing   Problem: Bowel/Gastric: Goal: Gastrointestinal status for postoperative course will improve Outcome: Progressing   Problem: Health Behavior/Discharge Planning: Goal: Identification of community resources to assist with postoperative recovery needs will improve Outcome: Progressing   Problem: Nutritional: Goal: Will attain and maintain optimal nutritional status will improve Outcome: Progressing   Problem: Clinical Measurements: Goal: Postoperative complications will be avoided or minimized Outcome: Progressing   Problem: Respiratory: Goal: Respiratory status will improve Outcome: Progressing   Problem: Skin Integrity: Goal: Will show signs of wound healing Outcome: Progressing   Problem: Education: Goal: Knowledge of General Education information will improve Description: Including pain rating scale, medication(s)/side effects and non-pharmacologic comfort measures Outcome: Progressing   Problem: Health Behavior/Discharge Planning: Goal: Ability to manage health-related needs will improve Outcome: Progressing   Problem: Clinical Measurements: Goal: Ability to maintain clinical measurements within normal limits will improve Outcome: Progressing Goal: Will remain free from infection Outcome: Progressing Goal: Diagnostic test results will improve Outcome: Progressing Goal: Respiratory complications will improve Outcome: Progressing Goal: Cardiovascular complication will be avoided Outcome: Progressing   Problem: Activity: Goal: Risk for activity intolerance will decrease Outcome: Progressing   Problem: Coping: Goal: Level of anxiety will decrease Outcome: Progressing   Problem: Elimination: Goal: Will not experience complications related to bowel motility Outcome: Progressing Goal: Will not experience complications related to  urinary retention Outcome: Progressing   Problem: Pain Managment: Goal: General experience of comfort will improve and/or be controlled Outcome: Progressing   Problem: Safety: Goal: Ability to remain free from injury will improve Outcome: Progressing   Problem: Skin Integrity: Goal: Risk for impaired skin integrity will decrease Outcome: Progressing   Problem: Education: Goal: Ability to describe self-care measures that may prevent or decrease complications (Diabetes Survival Skills Education) will improve Outcome: Progressing Goal: Individualized Educational Video(s) Outcome: Progressing   Problem: Coping: Goal: Ability to adjust to condition or change in health will improve Outcome: Progressing   Problem: Fluid Volume: Goal: Ability to maintain a balanced intake and output will improve Outcome: Progressing   Problem: Health Behavior/Discharge Planning: Goal: Ability to identify and utilize available resources and services will improve Outcome: Progressing Goal: Ability to manage health-related needs will improve Outcome: Progressing   Problem: Metabolic: Goal: Ability to maintain appropriate glucose levels will improve Outcome: Progressing   Problem: Nutritional: Goal: Maintenance of adequate nutrition will improve Outcome: Progressing Goal: Progress toward achieving an optimal weight will improve Outcome: Progressing   Problem: Skin Integrity: Goal: Risk for impaired skin integrity will decrease Outcome: Progressing   Problem: Tissue Perfusion: Goal: Adequacy of tissue perfusion will improve Outcome: Progressing   Problem: Activity: Goal: Ability to tolerate increased activity will improve Outcome: Progressing   Problem: Respiratory: Goal: Ability to maintain a clear airway and adequate ventilation will improve Outcome: Progressing   Problem: Role Relationship: Goal: Method of communication will improve Outcome: Progressing

## 2024-06-06 NOTE — Progress Notes (Signed)
 PT Cancellation Note  Patient Details Name: CLAIRA JETER MRN: [id number] DOB: 1940-12-14   Cancelled Treatment:     Pt stated she was not able to do anything at this time due to increased ABD pain.  PT has been evaluated with rec for SNF.  Rehab Team to continue to follow and attempt to another day as schedule permits.  Katheryn Leap  PTA Acute  Rehabilitation Services Office M-F          [phone]

## 2024-06-06 NOTE — Progress Notes (Signed)
 Pharmacy: Re- heparin    Patient is an 83 y.o F with hx large bowel obstruction (s/p Hartman's resection and colostomy creation in October 2024 that was complicated by anastomotic leak) and afib on Eliquis  PTA who presented to [facility] on 05/08/24 for colostomy reversal.  She underwent procedure on 05/08/24 and taken back to the OR on 06/03/24 for diverting loop transverse colostomy/ drainage of abdominal abscess/placement if G-tube and rectal tube. Patient is currently on heparin  drip while Eliquis  is on hold.   - confirmatory heparin  level collected at 7:37p is therapeutic at 0.34  Goal of Therapy:  Heparin  level 0.3-0.7 units/ml aPTT 66-102 seconds Monitor platelets by anticoagulation protocol: Yes   Plan: - heparin  drip at 1550 units/hr - daily heparin  level and cbc - monitor for s/sx bleeding  Iantha Batch, PharmD, BCPS 06/06/2024 9:07 PM

## 2024-06-06 NOTE — Progress Notes (Signed)
 06/06/2024  Elizabeth Odom [id number] [birth year]  CARE TEAM: PCP: Jesus Bernardino MATSU, MD  Outpatient Care Team: Patient Care Team: Jesus Bernardino MATSU, MD as PCP - General (Internal Medicine) Elmira Newman PARAS, MD as PCP - Cardiology (Cardiology) [name], MD as Consulting Physician (Gastroenterology) Rubin Calamity, MD as Consulting Physician (General Surgery) [name], MD (Neurology) [name], MD as Consulting Physician (Urology) [name], MD as Consulting Physician (Cardiology) [name], MD as Consulting Physician (Colon and Rectal Surgery)  Inpatient Treatment Team: Treatment Team:  Sonjia Held, MD Nikki Rams, Aliene, MD Dennise Kingsley, MD [name], RN Pccm, Md, MD Meade Verdon RAMAN, MD Rosan Deward ORN, NP Bobbette File, MD Sonjia Held, MD [name], MD Izola Brodie, RN Rudy Kirsch, NT Dustin Olam ORN, CCC-SLP   Problem List:   Principal Problem:   Diverticular disease Active Problems:   A-fib [facility])   Restless leg   ABLA (acute blood loss anemia)   Need for emotional support   Acute urinary retention   Mixed hyperlipidemia   Essential hypertension   Diverticular disease of left colon   History of DVT (deep vein thrombosis)   History of pulmonary embolus (PE)   Pre-diabetes   Presence of IVC filter   Stricture of sigmoid colon (HCC)   Protein-calorie malnutrition, severe   Hypokalemia   05/08/2024  POST-OPERATIVE DIAGNOSIS:   COLOSTOMY FOR RESECTION, DESIRE FOR OSTOMY TAKEDOWN RECTAL STRICTURE HISTORY OF DIVERTICULITIS WITH PERFORATION & ABSCESS   PROCEDURE:   -ROBOTIC RECTOSIGMOID RESECTION (LAR) -TAKEDOWN OF END COLOSTOMY WITH ANASTOMOSIS -RESECTION OF SMALL INTESTINE WITH ANASTOMOSIS -SMALL BOWEL REPAIR -LYSIS OF ADHESIONS x 115 MINUTES (66% OF CASE),  -INTRAOPERATIVE ASSESSMENT OF TISSUE VASCULAR PERFUSION USING ICG (indocyanine green ) IMMUNOFLUORESCENCE,  -TRANSVERSUS ABDOMINIS PLANE  (TAP) BLOCK - BILATERAL -FLEXIBLE SIGMOIDOSCOPY   SURGEON:  [name], MD   05/20/2024  Post procedural Dx: Post op abscess, multiple   Technically successful CT guided placed of a  10 Fr drainage catheter placement x 2  into the midline ventral and dorsal perirectal abscesses.     [name] LABOR, MD    05/28/2024  IMPRESSION: Satisfactory removal of the midline anterior abscess drainage catheter and placement of a new midline anterior pelvic drainage catheter as well as a posterior transgluteal drainage catheter in pelvic abscesses.     Electronically Signed   By: [name]    06/03/2024  POST-OPERATIVE DIAGNOSIS:   DELAYED COLORECTAL ANASTOMOTIC LEAK, NEED FOR FECAL DIVERSION CHRONIC PELVIC ABSCESS   PROCEDURE:   DIAGNOSTIC LAPAROSCOPY CONVERTED TO LAPAROTOMY DIVERTING LOOP TRANSVERSE COLOSTOMY DRAINAGE OF ABDOMINAL ABSCESS GASTROSTOMY TUBE PLACEMENT RECTAL TUBE PLACEMENT   SURGEON:  [name], MD   OR FINDINGS: Very dense concrete intraperitoneal and pelvic adhesions infraumbilically.  Bladder, small bowel, omentum walling off the lower pelvis with delayed anastomotic leak.   Near disruption of colorectal anastomosis by transanal examination.  Rectal tube placed through rectal stump to drain the pelvis more directly.   Diverting mid-transverse loop colostomy done.   Removal of old percutaneous abdominal and transgluteal drains.     Assessment [facility] Stay = 29 days) 3 Days Post-Op    Failure to thrive with delayed anastomotic leak    Plan:  Delayed anastomotic leak  status post percutaneous drainage x 2  diverting loop transverse colostomy (lower abdomen extremely fixed).  6/25 with very fixed abdomen.    Internal drainage with rectal tube overnight.  Removed POD#1.  Should continue to  transanally drain  Intraperitoneal drainage going over near prior abdominal abscess down towards left pelvis.  Keep for now.  Severe  malnutrition TPN at 100% for now.  Worsening distention and discomfort.  Stop G-tube feeds and place G-tube on low intermittent wall suction to more aggressively decompress the stomach.    Okay to try sips for now.  Infection: -Zosyn , Daptomycin , Micafungin  per ID   -Because she has source control, I would lean towards doing 5 more days and stopping to regroup since there was concern of the MRSA and Candida being colonization may be the only legitimate organism is Pseudomonas of colonization anyway.  Redo MRSA decolonization.  Agree with infectious ease to consider CT scan postop day 5 to make sure were not missing any intra-abdominal fluid collections.    Some recurrent atrial fibrillation and rapid rate.  History of thromboembolic events and IVC filter on chronic anticoagulation.  I restarted low-dose metoprolol  - improved.  See what internal medicine thinks.  Back on heparin  anticoagulation.  Acute blood loss anemia in the setting of anemia of chronic disease.   Transfused 6/26.  IV iron .  I would not trust enteral route.  History of urinary retention.  Will keep Foley catheter for 3 days postop and consider removal, especially if we can get her mobilizing out of bed.  -monitor electrolytes & replace as needed.  Keep K>4, Mg>2, Phos>3  -VTE prophylaxis- SCDs.  Anticoagulation as appropriate  -mobilize as tolerated to help recovery.  Try and get therapists involved to help her mobilize more.  Biggest challenge.  Patient does not like the idea of going to a skilled facility or rehab as she needed after her month hospital stay last year from emergency surgery.  I do not think that that is avoidable at this time. Work to have strategies to help her get up and mobilize and actually work with physical therapy, or she is going to lose her argument.    I updated the patient's status to the patient and nurse.  Recommendations were made.  Questions were answered.  They expressed understanding &  appreciation.  -Disposition: TBD - I think she needs SNF.  She has been very resistant, but she may reconsider      I reviewed last 24 h vitals and pain scores, last 48 h intake and output, last 24 h labs and trends, and last 24 h imaging results.  I have reviewed this patient's available data, including medical history, events of note, test results, etc as part of my evaluation.   A significant portion of that time was spent in counseling. Care during the described time interval was provided by me.  This care required moderate level of medical decision making.  06/06/2024    Subjective: (Chief complaint)  Uncomfortable in abdomen.  Did not ask for pain meds last night but wants some now.  Nurse just outside room.  Tired and frustrated.    Objective:  Vital signs:  Vitals:   06/05/24 1700 06/05/24 1951 06/06/24 0500 06/06/24 0513  BP: (!) 147/83 (!) 145/78  (!) 155/91  Pulse: 96 84  (!) 102  Resp: 16 17  18   Temp: 98.6 F (37 C) 97.8 F (36.6 C)  98.7 F (37.1 C)  TempSrc: Oral Oral  Oral  SpO2: 100% 100%  100%  Weight:   65.2 kg   Height:        Last BM Date : 06/05/24  Intake/Output   Yesterday:  06/26 0701 - 06/27 0700  In: 3300.2 [I.V.:2150.1; Blood:281.3; IV Piggyback:868.9] Out: 530 [Urine:450; Drains:80] This shift:  No intake/output data recorded.  Bowel function:  Flatus: No  BM:  No   Drain:  Left lower quadrant Blake drain serosanguineous going down to left lateral pelvis.  Physical Exam:  General: Intubated sedated.  No focal motor deficits.  More alert today but exhausted.         Eyes: PERRL, normal EOM.  Sclera clear.  No icterus Neuro: CN II-XII intact w/o focal sensory/motor deficits.  No facial droop. Lymph: No head/neck/groin lymphadenopathy Psych:  No delerium/psychosis/paranoia.  Oriented x 4 HENT: Normocephalic, Mucus membranes moist.  No thrush.  Mild HOH Neck: Supple, No tracheal deviation.  No obvious thyromegaly.   Prefers to sit with head flexed and chin down.  Does not want to have her neck back.  Pillow adjusted. Chest: No pain to chest wall compression.  Good respiratory excursion.  No audible wheezing CV:  Pulses intact.  Regular rhythm.  No major extremity edema MS: Normal AROM mjr joints.  No obvious deformity  Abdomen:  Soft.  Moderately distended.  Mildly tender at incisions only.  No guarding.   Right upper quadrant mid transverse diverting loop colostomy pink with edema.  Minimal thin bilious stool and no gas   GU: Foley in place with light orange clear urine  Rectal: Some erythema but improved overall.  No diarrhea in bed.  Removed rectal tube. Ext:   No deformity.  No mjr edema.  No cyanosis Skin: No petechiae / purpurea.  No major sores.  Warm and dry    Results:   Cultures: Recent Results (from the past 720 hours)  Urine Culture     Status: Abnormal   Collection Time: 05/09/24  8:30 PM   Specimen: Urine, Clean Catch  Result Value Ref Range Status   Specimen Description   Final    URINE, CLEAN CATCH Performed at [facility], [address], [place], [place] [address]    Special Requests   Final    NONE Performed at [facility], [address] [place], [place] [address]    Culture (A)  Final    <10,000 COLONIES/mL INSIGNIFICANT GROWTH Performed at [facility], [address], [place], [place] [address]    Report Status 05/11/2024 FINAL  Final  MIC (1 Drug)-Abdominal drain abscess; 05/22/2024; Abdomen; MRSA; Daptomycin      Status: Abnormal   Collection Time: 05/22/24  1:54 PM   Specimen: Abdomen  Result Value Ref Range Status   Min Inhibitory Conc (1 Drug) Final report (A)  Corrected    Comment: (NOTE) Performed At: [facility] [address] [place], [place] [id number] Jennette Shorter MD Ey:[id number] CORRECTED ON 06/24 AT 0836: PREVIOUSLY REPORTED AS Preliminary report    Source ABSCESS  Final    Comment:  Performed at [facility], [address], [place], [place] [address]  MIC Result     Status: Abnormal   Collection Time: 05/22/24  1:54 PM  Result Value Ref Range Status   Result 1 (MIC) Comment (A)  Final    Comment: (NOTE) Methicillin - resistant Staphylococcus aureus Identification performed by account, not confirmed by this laboratory. DAPTOMYCIN    <=1 UG/ML = SUSCEPTIBLE Performed At: [facility] [address] [place], [place] [id number] Jennette Shorter MD Ey:[id number]   Aerobic/Anaerobic Culture w Gram Stain (surgical/deep wound)     Status: None   Collection Time: 05/22/24  3:59 PM  Specimen: Abdomen  Result Value Ref Range Status   Specimen Description   Final    ABDOMEN Performed at [facility], [address] [place], [place] [address]    Special Requests   Final    ABDOMINAL DRAIN Performed at [facility], [address], [place], [place] [address]    Gram Stain   Final    ABUNDANT WBC PRESENT, PREDOMINANTLY PMN RARE GRAM POSITIVE COCCI RARE YEAST WITH PSEUDOHYPHAE    Culture   Final    MODERATE PSEUDOMONAS AERUGINOSA MODERATE STAPHYLOCOCCUS AUREUS SUSCEPTIBILITIES PERFORMED ON PREVIOUS CULTURE WITHIN THE LAST 5 DAYS. MODERATE CANDIDA ALBICANS SEE SEPARATE REPORT NO ANAEROBES ISOLATED Performed at [facility], [address] [place], [place] [address]    Report Status 06/04/2024 FINAL  Final   Organism ID, Bacteria PSEUDOMONAS AERUGINOSA  Final      Susceptibility   Pseudomonas aeruginosa - MIC*    CEFTAZIDIME 4 SENSITIVE Sensitive     CIPROFLOXACIN <=0.25 SENSITIVE Sensitive     GENTAMICIN 4 SENSITIVE Sensitive     IMIPENEM 2 SENSITIVE Sensitive     PIP/TAZO <=4 SENSITIVE Sensitive ug/mL    CEFEPIME 2 SENSITIVE Sensitive     * MODERATE PSEUDOMONAS AERUGINOSA  Aerobic/Anaerobic Culture w Gram Stain (surgical/deep wound)     Status: None   Collection Time: 05/22/24  3:59 PM   Specimen:  Back  Result Value Ref Range Status   Specimen Description   Final    BACK Performed at [facility], [address], [place], [place] [address]    Special Requests   Final    TRANSGLUTEAL PERCUTANEOUS IR DRAIN Performed at [facility], [address], [place], [place] [address]    Gram Stain   Final    NO WBC SEEN ABUNDANT GRAM NEGATIVE RODS RARE GRAM POSITIVE COCCI IN PAIRS RARE BUDDING YEAST SEEN    Culture   Final    ABUNDANT PSEUDOMONAS AERUGINOSA ABUNDANT METHICILLIN RESISTANT STAPHYLOCOCCUS AUREUS ABUNDANT ENTEROCOCCUS FAECALIS ABUNDANT CANDIDA ALBICANS NO ANAEROBES ISOLATED Sent to [facility] for further susceptibility testing. Performed at [facility], [address] [place], [place] [address]    Report Status 05/27/2024 FINAL  Final   Organism ID, Bacteria PSEUDOMONAS AERUGINOSA  Final   Organism ID, Bacteria METHICILLIN RESISTANT STAPHYLOCOCCUS AUREUS  Final   Organism ID, Bacteria ENTEROCOCCUS FAECALIS  Final      Susceptibility   Enterococcus faecalis - MIC*    AMPICILLIN <=2 SENSITIVE Sensitive     VANCOMYCIN  1 SENSITIVE Sensitive     GENTAMICIN SYNERGY SENSITIVE Sensitive     * ABUNDANT ENTEROCOCCUS FAECALIS   Methicillin resistant staphylococcus aureus - MIC*    CIPROFLOXACIN >=8 RESISTANT Resistant     ERYTHROMYCIN  >=8 RESISTANT Resistant     GENTAMICIN <=0.5 SENSITIVE Sensitive     OXACILLIN >=4 RESISTANT Resistant     TETRACYCLINE <=1 SENSITIVE Sensitive     VANCOMYCIN  1 SENSITIVE Sensitive     TRIMETH/SULFA >=320 RESISTANT Resistant     CLINDAMYCIN <=0.25 SENSITIVE Sensitive     RIFAMPIN <=0.5 SENSITIVE Sensitive     Inducible Clindamycin NEGATIVE Sensitive     LINEZOLID 2 SENSITIVE Sensitive     * ABUNDANT METHICILLIN RESISTANT STAPHYLOCOCCUS AUREUS   Pseudomonas aeruginosa - MIC*    CEFTAZIDIME 4 SENSITIVE Sensitive     CIPROFLOXACIN <=0.25 SENSITIVE Sensitive     GENTAMICIN 4 SENSITIVE Sensitive      IMIPENEM 2 SENSITIVE Sensitive  PIP/TAZO <=4 SENSITIVE Sensitive ug/mL    CEFEPIME 2 SENSITIVE Sensitive     * ABUNDANT PSEUDOMONAS AERUGINOSA  Yeast Susceptibilities     Status: None   Collection Time: 05/22/24  3:59 PM  Result Value Ref Range Status   SOURCE CANDIDA ALBICAN ABD ABSC  Final    Comment: Performed at [facility], [address], [place], [place] [address]   Organism ID, Yeast Candida albicans  Final    Comment: (NOTE) Identification performed by account, not confirmed by this laboratory.    Amphotericin B MIC 0.25 ug/mL  Final    Comment: (NOTE) Breakpoints have been established for only some organism-drug combinations as indicated. This test was developed and its performance characteristics determined by [facility]. It has not been cleared or approved by the Food and Drug Administration.    Anidulafungin MIC Comment  Final    Comment: (NOTE) 0.03 ug/mL Susceptible Breakpoints have been established for only some organism-drug combinations as indicated. This test was developed and its performance characteristics determined by [facility]. It has not been cleared or approved by the Food and Drug Administration.    Caspofungin MIC Comment  Final    Comment: (NOTE) 0.12 ug/mL Susceptible Breakpoints have been established for only some organism-drug combinations as indicated. This test was developed and its performance characteristics determined by [facility]. It has not been cleared or approved by the Food and Drug Administration.    Fluconazole  Islt MIC 1.0 ug/mL Susceptible  Final    Comment: (NOTE) Breakpoints have been established for only some organism-drug combinations as indicated. This test was developed and its performance characteristics determined by [facility]. It has not been cleared or approved by the Food and Drug Administration.    ISAVUCONAZOLE MIC 0.008 ug/mL or less  Final    Comment: (NOTE) This test was developed and its performance  characteristics determined by [facility]. It has not been cleared or approved by the Food and Drug Administration.    Itraconazole MIC 0.06 ug/mL  Final    Comment: (NOTE) Breakpoints have been established for only some organism-drug combinations as indicated. This test was developed and its performance characteristics determined by [facility]. It has not been cleared or approved by the Food and Drug Administration.    Micafungin  MIC Comment  Final    Comment: (NOTE) 0.008 ug/mL or less, Susceptible Breakpoints have been established for only some organism-drug combinations as indicated. This test was developed and its performance characteristics determined by [facility]. It has not been cleared or approved by the Food and Drug Administration.    Posaconazole MIC 0.06 ug/mL  Final    Comment: (NOTE) Breakpoints have been established for only some organism-drug combinations as indicated. This test was developed and its performance characteristics determined by [facility]. It has not been cleared or approved by the Food and Drug Administration.    REZAFUNGIN MIC Comment  Final    Comment: (NOTE) 0.15 ug/mL Susceptible This test was developed and its performance characteristics determined by [facility]. It has not been cleared or approved by the Food and Drug Administration.    Voriconazole MIC Comment  Final    Comment: (NOTE) 0.15 ug/mL Susceptible Breakpoints have been established for only some organism-drug combinations as indicated. This test was developed and its performance characteristics determined by [facility]. It has not been cleared or approved by the Food and Drug Administration. Performed At: [facility] [address] [place], [place] [id number] Jennette Shorter MD Ey:[id number]   Aerobic/Anaerobic Culture w Gram Stain (  surgical/deep wound)     Status: None   Collection Time: 05/27/24  5:19 PM   Specimen: Abscess  Result Value Ref Range Status   Specimen  Description   Final    ABSCESS ABDOMEN Performed at [facility], [address], [place], [place] [address]    Special Requests   Final    NONE Performed at [facility] [place], [address] [place], [place] [address]    Gram Stain   Final    ABUNDANT WBC PRESENT, PREDOMINANTLY PMN NO ORGANISMS SEEN    Culture   Final    RARE PSEUDOMONAS AERUGINOSA RARE CANDIDA ALBICANS NO ANAEROBES ISOLATED Performed at [facility], [address] [place], [place] [address]    Report Status 06/01/2024 FINAL  Final   Organism ID, Bacteria PSEUDOMONAS AERUGINOSA  Final      Susceptibility   Pseudomonas aeruginosa - MIC*    CEFTAZIDIME 4 SENSITIVE Sensitive     CIPROFLOXACIN <=0.25 SENSITIVE Sensitive     GENTAMICIN 4 SENSITIVE Sensitive     IMIPENEM 2 SENSITIVE Sensitive     PIP/TAZO <=4 SENSITIVE Sensitive ug/mL    CEFEPIME 2 SENSITIVE Sensitive     * RARE PSEUDOMONAS AERUGINOSA  Surgical pcr screen     Status: Abnormal   Collection Time: 06/02/24  7:55 PM   Specimen: Nasal Mucosa; Nasal Swab  Result Value Ref Range Status   MRSA, PCR POSITIVE (A) NEGATIVE Final   Staphylococcus aureus POSITIVE (A) NEGATIVE Final    Comment: (NOTE) The Xpert SA Assay (FDA approved for NASAL specimens in patients 18 years of age and older), is one component of a comprehensive surveillance program. It is not intended to diagnose infection nor to guide or monitor treatment. Performed at [facility], [address], [place], [place] [address]     Labs: Results for orders placed or performed during the hospital encounter of 05/08/24 (from the past 48 hours)  Glucose, capillary     Status: Abnormal   Collection Time: 06/04/24  7:37 AM  Result Value Ref Range   Glucose-Capillary 160 (H) 70 - 99 mg/dL    Comment: Glucose reference range applies only to samples taken after fasting for at least 8 hours.  Glucose, capillary     Status: Abnormal    Collection Time: 06/04/24 12:04 PM  Result Value Ref Range   Glucose-Capillary 163 (H) 70 - 99 mg/dL    Comment: Glucose reference range applies only to samples taken after fasting for at least 8 hours.  APTT     Status: Abnormal   Collection Time: 06/04/24  3:22 PM  Result Value Ref Range   aPTT 41 (H) 24 - 36 seconds    Comment:        IF BASELINE aPTT IS ELEVATED, SUGGEST PATIENT RISK ASSESSMENT BE USED TO DETERMINE APPROPRIATE ANTICOAGULANT THERAPY. Performed at [facility], [address], [place], [place] [address]   Heparin  level (unfractionated)     Status: None   Collection Time: 06/04/24  3:23 PM  Result Value Ref Range   Heparin  Unfractionated 0.60 0.30 - 0.70 IU/mL    Comment: (NOTE) The clinical reportable range upper limit is being lowered to >1.10 to align with the FDA approved guidance for the current laboratory assay.  If heparin  results are below expected values, and patient dosage has  been confirmed, suggest follow up testing of antithrombin III levels. Performed at [facility], [address], [place], [place] [address]   CBC     Status: Abnormal   Collection Time: 06/04/24  3:46 PM  Result Value Ref Range   WBC 12.4 (H) 4.0 - 10.5 K/uL   RBC 2.65 (L) 3.87 - 5.11 MIL/uL   Hemoglobin 7.2 (L) 12.0 - 15.0 g/dL   HCT 76.1 (L) 63.9 - 53.9 %   MCV 89.8 80.0 - 100.0 fL   MCH 27.2 26.0 - 34.0 pg   MCHC 30.3 30.0 - 36.0 g/dL   RDW 83.4 (H) 88.4 - 84.4 %   Platelets 370 150 - 400 K/uL   nRBC 0.0 0.0 - 0.2 %    Comment: Performed at [facility], [address], [place], [place] [address]  Glucose, capillary     Status: Abnormal   Collection Time: 06/04/24  4:12 PM  Result Value Ref Range   Glucose-Capillary 136 (H) 70 - 99 mg/dL    Comment: Glucose reference range applies only to samples taken after fasting for at least 8 hours.  Glucose, capillary     Status: Abnormal   Collection Time: 06/04/24  11:37 PM  Result Value Ref Range   Glucose-Capillary 135 (H) 70 - 99 mg/dL    Comment: Glucose reference range applies only to samples taken after fasting for at least 8 hours.  CBC     Status: Abnormal   Collection Time: 06/05/24  4:29 AM  Result Value Ref Range   WBC 13.5 (H) 4.0 - 10.5 K/uL   RBC 2.44 (L) 3.87 - 5.11 MIL/uL   Hemoglobin 7.1 (L) 12.0 - 15.0 g/dL   HCT 76.6 (L) 63.9 - 53.9 %   MCV 95.5 80.0 - 100.0 fL   MCH 29.1 26.0 - 34.0 pg   MCHC 30.5 30.0 - 36.0 g/dL   RDW 83.0 (H) 88.4 - 84.4 %   Platelets 361 150 - 400 K/uL   nRBC 0.0 0.0 - 0.2 %    Comment: Performed at [facility], [address], [place], [place] [address]  APTT     Status: Abnormal   Collection Time: 06/05/24  4:29 AM  Result Value Ref Range   aPTT 40 (H) 24 - 36 seconds    Comment:        IF BASELINE aPTT IS ELEVATED, SUGGEST PATIENT RISK ASSESSMENT BE USED TO DETERMINE APPROPRIATE ANTICOAGULANT THERAPY. Performed at [facility], [address], [place], [place] [address]   Heparin  level (unfractionated)     Status: Abnormal   Collection Time: 06/05/24  4:29 AM  Result Value Ref Range   Heparin  Unfractionated 0.19 (L) 0.30 - 0.70 IU/mL    Comment: (NOTE) The clinical reportable range upper limit is being lowered to >1.10 to align with the FDA approved guidance for the current laboratory assay.  If heparin  results are below expected values, and patient dosage has  been confirmed, suggest follow up testing of antithrombin III levels. Performed at [facility], [address], [place], [place] [address]   Glucose, capillary     Status: Abnormal   Collection Time: 06/05/24  5:23 AM  Result Value Ref Range   Glucose-Capillary 146 (H) 70 - 99 mg/dL    Comment: Glucose reference range applies only to samples taken after fasting for at least 8 hours.  Type and screen [facility]     Status: None (Preliminary result)    Collection Time: 06/05/24  8:29 AM  Result Value Ref  Range   ABO/RH(D) A POS    Antibody Screen NEG    Sample Expiration 06/08/2024,2359    Unit Number [id number]    Blood Component Type RBC LR PHER2    Unit division 00    Status of Unit ISSUED    Transfusion Status OK TO TRANSFUSE    Crossmatch Result      Compatible Performed at [facility], [address], [place], [place] [address]   Comprehensive metabolic panel with GFR     Status: Abnormal   Collection Time: 06/05/24  8:30 AM  Result Value Ref Range   Sodium 138 135 - 145 mmol/L   Potassium 4.0 3.5 - 5.1 mmol/L   Chloride 107 98 - 111 mmol/L   CO2 22 22 - 32 mmol/L   Glucose, Bld 140 (H) 70 - 99 mg/dL    Comment: Glucose reference range applies only to samples taken after fasting for at least 8 hours.   BUN 21 8 - 23 mg/dL   Creatinine, Ser 9.44 0.44 - 1.00 mg/dL   Calcium  8.0 (L) 8.9 - 10.3 mg/dL   Total Protein 6.4 (L) 6.5 - 8.1 g/dL   Albumin  1.7 (L) 3.5 - 5.0 g/dL   AST 26 15 - 41 U/L   ALT 21 0 - 44 U/L   Alkaline Phosphatase 146 (H) 38 - 126 U/L   Total Bilirubin 1.3 (H) 0.0 - 1.2 mg/dL   GFR, Estimated >39 >39 mL/min    Comment: (NOTE) Calculated using the CKD-EPI Creatinine Equation (2021)    Anion gap 9 5 - 15    Comment: Performed at [facility], [address], [place], [place] [address]  Magnesium      Status: None   Collection Time: 06/05/24  8:30 AM  Result Value Ref Range   Magnesium  1.9 1.7 - 2.4 mg/dL    Comment: Performed at [facility], [address], [place], [place] [address]  Phosphorus     Status: Abnormal   Collection Time: 06/05/24  8:30 AM  Result Value Ref Range   Phosphorus 1.9 (L) 2.5 - 4.6 mg/dL    Comment: Performed at [facility], [address], [place], [place] [address]  Prepare RBC (crossmatch)     Status: None   Collection Time: 06/05/24  8:30 AM  Result Value Ref Range   Order Confirmation       ORDER PROCESSED BY BLOOD BANK Performed at [facility], [address], [place], [place] [address]   Glucose, capillary     Status: Abnormal   Collection Time: 06/05/24 11:51 AM  Result Value Ref Range   Glucose-Capillary 152 (H) 70 - 99 mg/dL    Comment: Glucose reference range applies only to samples taken after fasting for at least 8 hours.  Heparin  level (unfractionated)     Status: Abnormal   Collection Time: 06/05/24  3:30 PM  Result Value Ref Range   Heparin  Unfractionated 0.16 (L) 0.30 - 0.70 IU/mL    Comment: (NOTE) The clinical reportable range upper limit is being lowered to >1.10 to align with the FDA approved guidance for the current laboratory assay.  If heparin  results are below expected values, and patient dosage has  been confirmed, suggest follow up testing of antithrombin III levels. Performed at [facility], [address] [place], [place] [address]   Glucose, capillary     Status: Abnormal   Collection Time: 06/05/24  5:50 PM  Result Value Ref Range   Glucose-Capillary 112 (H) 70 - 99 mg/dL    Comment: Glucose reference range applies only to samples taken after fasting for at least 8 hours.  Glucose, capillary     Status: Abnormal   Collection Time: 06/05/24 11:53 PM  Result Value Ref Range   Glucose-Capillary 124 (H) 70 - 99 mg/dL    Comment: Glucose reference range applies only to samples taken after fasting for at least 8 hours.  CBC     Status: Abnormal   Collection Time: 06/06/24  3:46 AM  Result Value Ref Range   WBC 11.7 (H) 4.0 - 10.5 K/uL   RBC 2.85 (L) 3.87 - 5.11 MIL/uL   Hemoglobin 8.0 (L) 12.0 - 15.0 g/dL   HCT 74.1 (L) 63.9 - 53.9 %   MCV 90.5 80.0 - 100.0 fL   MCH 28.1 26.0 - 34.0 pg   MCHC 31.0 30.0 - 36.0 g/dL   RDW 83.9 (H) 88.4 - 84.4 %   Platelets 353 150 - 400 K/uL   nRBC 0.2 0.0 - 0.2 %    Comment: Performed at [facility], [address], [place], [place]  [address]  Basic metabolic panel with GFR     Status: Abnormal   Collection Time: 06/06/24  3:46 AM  Result Value Ref Range   Sodium 135 135 - 145 mmol/L   Potassium 3.8 3.5 - 5.1 mmol/L   Chloride 104 98 - 111 mmol/L   CO2 22 22 - 32 mmol/L   Glucose, Bld 121 (H) 70 - 99 mg/dL    Comment: Glucose reference range applies only to samples taken after fasting for at least 8 hours.   BUN 21 8 - 23 mg/dL   Creatinine, Ser 9.38 0.44 - 1.00 mg/dL   Calcium  8.0 (L) 8.9 - 10.3 mg/dL   GFR, Estimated >39 >39 mL/min    Comment: (NOTE) Calculated using the CKD-EPI Creatinine Equation (2021)    Anion gap 9 5 - 15    Comment: Performed at [facility], [address], [place], [place] [address]  Magnesium      Status: None   Collection Time: 06/06/24  3:46 AM  Result Value Ref Range   Magnesium  1.9 1.7 - 2.4 mg/dL    Comment: Performed at [facility], [address], [place], [place] [address]  Phosphorus     Status: Abnormal   Collection Time: 06/06/24  3:46 AM  Result Value Ref Range   Phosphorus 2.2 (L) 2.5 - 4.6 mg/dL    Comment: Performed at [facility], [address], [place], [place] [address]  Heparin  level (unfractionated)     Status: Abnormal   Collection Time: 06/06/24  3:46 AM  Result Value Ref Range   Heparin  Unfractionated 0.29 (L) 0.30 - 0.70 IU/mL    Comment: (NOTE) The clinical reportable range upper limit is being lowered to >1.10 to align with the FDA approved guidance for the current laboratory assay.  If heparin  results are below expected values, and patient dosage has  been confirmed, suggest follow up testing of antithrombin III levels. Performed at [facility], [address] [place], [place] [address]   Glucose, capillary     Status: Abnormal   Collection Time: 06/06/24  6:09 AM  Result Value Ref Range   Glucose-Capillary 115 (H) 70 - 99 mg/dL    Comment: Glucose reference range  applies only to samples taken after fasting for  at least 8 hours.    Imaging / Studies: No results found.      Medications / Allergies: per chart  Antibiotics: Anti-infectives (From admission, onward)    Start     Dose/Rate Route Frequency Ordered Stop   06/06/24 1000  micafungin  (MYCAMINE ) 100 mg in sodium chloride  0.9 % 100 mL IVPB        100 mg 105 mL/hr over 1 Hours Intravenous Every 24 hours 06/05/24 1349 06/20/24 0959   06/04/24 1400  piperacillin -tazobactam (ZOSYN ) IVPB 3.375 g        3.375 g 12.5 mL/hr over 240 Minutes Intravenous Every 8 hours 06/04/24 0738 06/09/24 1359   06/04/24 1400  DAPTOmycin  (CUBICIN ) IVPB 500 mg/55mL premix        8 mg/kg  60.9 kg 100 mL/hr over 30 Minutes Intravenous Daily 06/04/24 0738 06/09/24 1359   06/03/24 1000  neomycin  (MYCIFRADIN ) tablet 500 mg  Status:  Discontinued        500 mg Oral 3 times daily 06/03/24 0935 06/03/24 1555   06/03/24 1000  metroNIDAZOLE  (FLAGYL ) tablet 500 mg  Status:  Discontinued       Note to Pharmacy: Take 2 pills (=1000mg ) by mouth at 1pm, 3pm, and 10pm the day before your colorectal operation   500 mg Oral 3 times daily 06/03/24 0935 06/03/24 1555   05/29/24 1500  DAPTOmycin  (CUBICIN ) IVPB 500 mg/50mL premix  Status:  Discontinued        8 mg/kg  60.9 kg 100 mL/hr over 30 Minutes Intravenous Daily 05/29/24 1359 06/04/24 0738   05/28/24 2100  vancomycin  (VANCOREADY) IVPB 750 mg/150 mL  Status:  Discontinued        750 mg 150 mL/hr over 60 Minutes Intravenous Every 12 hours 05/28/24 1419 05/29/24 1359   05/27/24 0800  vancomycin  (VANCOCIN ) IVPB 1000 mg/200 mL premix  Status:  Discontinued        1,000 mg 200 mL/hr over 60 Minutes Intravenous Every 24 hours 05/26/24 1144 05/28/24 1419   05/23/24 1400  piperacillin -tazobactam (ZOSYN ) IVPB 3.375 g  Status:  Discontinued        3.375 g 12.5 mL/hr over 240 Minutes Intravenous Every 8 hours 05/23/24 0804 06/04/24 0738   05/23/24 1200  micafungin  (MYCAMINE ) 100  mg in sodium chloride  0.9 % 100 mL IVPB        100 mg 105 mL/hr over 1 Hours Intravenous Every 24 hours 05/23/24 0755 06/05/24 1221   05/22/24 0800  vancomycin  (VANCOCIN ) IVPB 1000 mg/200 mL premix  Status:  Discontinued        1,000 mg 200 mL/hr over 60 Minutes Intravenous Every 24 hours 05/21/24 0634 05/26/24 0806   05/21/24 0730  vancomycin  (VANCOREADY) IVPB 1250 mg/250 mL        1,250 mg 166.7 mL/hr over 90 Minutes Intravenous  Once 05/21/24 0634 05/21/24 1043   05/19/24 1400  piperacillin -tazobactam (ZOSYN ) IVPB 3.375 g  Status:  Discontinued        3.375 g 12.5 mL/hr over 240 Minutes Intravenous Every 8 hours 05/19/24 1303 05/23/24 0804   05/09/24 0900  erythromycin  250 mg in sodium chloride  0.9 % 100 mL IVPB        250 mg 100 mL/hr over 60 Minutes Intravenous Every 8 hours 05/09/24 0732 05/11/24 0044   05/08/24 2200  cefoTEtan  (CEFOTAN ) 2 g in sodium chloride  0.9 % 100 mL IVPB        2 g 200 mL/hr over 30 Minutes Intravenous Every 12 hours 05/08/24 1759  05/09/24 0749   05/08/24 1400  neomycin  (MYCIFRADIN ) tablet 1,000 mg  Status:  Discontinued       Placed in And Linked Group   1,000 mg Oral 3 times per day 05/08/24 1119 05/08/24 1120   05/08/24 1400  metroNIDAZOLE  (FLAGYL ) tablet 1,000 mg  Status:  Discontinued       Placed in And Linked Group   1,000 mg Oral 3 times per day 05/08/24 1119 05/08/24 1120   05/08/24 1130  cefoTEtan  (CEFOTAN ) 2 g in sodium chloride  0.9 % 100 mL IVPB        2 g 200 mL/hr over 30 Minutes Intravenous On call to O.R. 05/08/24 1119 05/09/24 0749         Note: Portions of this report may have been transcribed using voice recognition software. Every effort was made to ensure accuracy; however, inadvertent computerized transcription errors may be present.   Any transcriptional errors that result from this process are unintentional.    Elspeth KYM Schultze, MD, FACS, MASCRS Esophageal, Gastrointestinal & Colorectal Surgery Robotic and Minimally  Invasive Surgery  [facility] [address] [place], [place] [address] [phone] Fax [phone] Main  CONTACT INFORMATION: Weekday (9AM-5PM): Call [facility] main office at [phone] Weeknight (5PM-9AM) or Weekend/Holiday: Check EPIC Web Links tab & use AMION (password  [email or web address]) for General Surgery [facility] coverage  Please, DO NOT use SecureChat  (it is not reliable communication to reach operating surgeons & will lead to a delay in care).   Epic staff messaging available for outptient concerns needing 1-2 business day response.      06/06/2024  6:39 AM

## 2024-06-07 DIAGNOSIS — K5790 Diverticulosis of intestine, part unspecified, without perforation or abscess without bleeding: Secondary | ICD-10-CM | POA: Diagnosis not present

## 2024-06-07 LAB — BASIC METABOLIC PANEL WITH GFR
Anion gap: 11 (ref 5–15)
BUN: 19 mg/dL (ref 8–23)
CO2: 21 mmol/L — ABNORMAL LOW (ref 22–32)
Calcium: 7.8 mg/dL — ABNORMAL LOW (ref 8.9–10.3)
Chloride: 101 mmol/L (ref 98–111)
Creatinine, Ser: 0.47 mg/dL (ref 0.44–1.00)
GFR, Estimated: >60 mL/min (ref >60)
Glucose, Bld: 133 mg/dL — ABNORMAL HIGH (ref 70–99)
Potassium: 3.8 mmol/L (ref 3.5–5.1)
Sodium: 133 mmol/L — ABNORMAL LOW (ref 135–145)

## 2024-06-07 LAB — CBC
HCT: 27.6 % — ABNORMAL LOW (ref 36.0–46.0)
Hemoglobin: 8.6 g/dL — ABNORMAL LOW (ref 12.0–15.0)
MCH: 28 pg (ref 26.0–34.0)
MCHC: 31.2 g/dL (ref 30.0–36.0)
MCV: 89.9 fL (ref 80.0–100.0)
Platelets: 380 10*3/uL (ref 150–400)
RBC: 3.07 MIL/uL — ABNORMAL LOW (ref 3.87–5.11)
RDW: 16.6 % — ABNORMAL HIGH (ref 11.5–15.5)
WBC: 13.1 10*3/uL — ABNORMAL HIGH (ref 4.0–10.5)
nRBC: 0.3 % — ABNORMAL HIGH (ref 0.0–0.2)

## 2024-06-07 LAB — GLUCOSE, CAPILLARY
Glucose-Capillary: 126 mg/dL — ABNORMAL HIGH (ref 70–99)
Glucose-Capillary: 129 mg/dL — ABNORMAL HIGH (ref 70–99)
Glucose-Capillary: 136 mg/dL — ABNORMAL HIGH (ref 70–99)
Glucose-Capillary: 152 mg/dL — ABNORMAL HIGH (ref 70–99)

## 2024-06-07 LAB — MAGNESIUM: Magnesium: 2 mg/dL (ref 1.7–2.4)

## 2024-06-07 LAB — CK: Total CK: 19 U/L — ABNORMAL LOW (ref 38–234)

## 2024-06-07 LAB — TRIGLYCERIDES: Triglycerides: 174 mg/dL — ABNORMAL HIGH (ref <150)

## 2024-06-07 LAB — PHOSPHORUS: Phosphorus: 2.5 mg/dL (ref 2.5–4.6)

## 2024-06-07 LAB — HEPARIN LEVEL (UNFRACTIONATED): Heparin Unfractionated: 0.3 [IU]/mL (ref 0.30–0.70)

## 2024-06-07 MED ORDER — HYDROMORPHONE HCL 1 MG/ML IJ SOLN
0.5000 mg | INTRAMUSCULAR | Status: DC | PRN
  Administered 2024-06-07 – 2024-06-10 (×15): 0.5 mg via INTRAVENOUS
  Filled 2024-06-07 (×17): qty 0.5

## 2024-06-07 MED ORDER — ACETAMINOPHEN 500 MG PO TABS
1000.0000 mg | ORAL_TABLET | Freq: Four times a day (QID) | ORAL | Status: DC
  Administered 2024-06-07 – 2024-06-23 (×56): 1000 mg
  Filled 2024-06-07 (×58): qty 2

## 2024-06-07 MED ORDER — DIPHENHYDRAMINE-ZINC ACETATE 2-0.1 % EX CREA
TOPICAL_CREAM | Freq: Three times a day (TID) | CUTANEOUS | Status: DC | PRN
  Administered 2024-06-12: 1 via TOPICAL
  Filled 2024-06-07 (×3): qty 28

## 2024-06-07 MED ORDER — TRAVASOL 10 % IV SOLN
INTRAVENOUS | Status: AC
  Filled 2024-06-07: qty 940.8

## 2024-06-07 MED ORDER — METHOCARBAMOL 1000 MG/10ML IJ SOLN
500.0000 mg | Freq: Three times a day (TID) | INTRAMUSCULAR | Status: DC
  Administered 2024-06-07 – 2024-06-11 (×12): 500 mg via INTRAVENOUS
  Filled 2024-06-07 (×12): qty 10

## 2024-06-07 MED ORDER — OXYCODONE HCL 5 MG PO TABS
5.0000 mg | ORAL_TABLET | ORAL | Status: DC | PRN
  Administered 2024-06-07 – 2024-06-10 (×3): 5 mg
  Filled 2024-06-07 (×3): qty 1

## 2024-06-07 NOTE — Progress Notes (Signed)
 PROGRESS NOTE  Elizabeth Odom FMW:[id number] DOB: 05/23/1941 DOA: 05/08/2024 PCP: Jesus Bernardino MATSU, MD   LOS: 30 days   Brief narrative:  Elizabeth Odom is a 83 y.o. female with a history of atrial fibrillation, DVT, PE, anemia, PACs, hypertension, mild cognitive impairment, history of large bowel obstruction status post colonic resection and colostomy creation last year.  Her clinical course was complicated by anastomotic leak in presented last month for a colostomy resection and takedown but had complicated postop course with dense adhesions.  Patient also had atrial fibrillation with RVR polypharmacy and altered mental status.  At this time patient has been in the hospital for intra-abdominal abscess with polymicrobial growth and has been having poor oral intake and on TPN.  Patient was taken back to the OR on 625 for lysis of adhesions colostomy placement and drainage of abscess.  Rectal tube was placed thin for drainage.  Patient also had G-tube placed in for nutrition.  Intraoperatively patient had hypotension requiring vasopressors but this resolved and patient has been extubated as well.  At this time, patient has been considered for transfer out of the ICU  Sequence of events. 5/29 presented for elective colostomy takedown per Dr. Sheldon  6/10 Transgluteal drain placed per IR 6/17 CT guided drain placed into the abdominal and pelvic abscesses yielding pus and stool.   6/24 Back to the OR with Dr. Sheldon for lysis of adhesions, colostomy placement, drainage of abscess, and placement of G-tube, Intraoperatively she had episode of hypotension requiring vasopressors immediately upon insufflation to the abdomen. 6/25 extubated.  Assessment/Plan: Principal Problem:   Diverticular disease Active Problems:   A-fib (HCC)   Restless leg   ABLA (acute blood loss anemia)   Need for emotional support   Acute urinary retention   Mixed hyperlipidemia   Essential hypertension   Diverticular  disease of left colon   History of DVT (deep vein thrombosis)   History of pulmonary embolus (PE)   Pre-diabetes   Presence of IVC filter   Stricture of sigmoid colon (HCC)   Protein-calorie malnutrition, severe   Hypokalemia   Complex surgical abdomen POD 1 ex-lap for lysis of adhesions Diverting Transverse loop colostomy G tube placement Rectal tube placement (for abscess drainage) Continue TPN wound VAC and management as per surgery.  On G-tube with intermittent wall suction.  Patient observed a little more alert no nausea or vomiting today   Intra-abdominal abscess Infectious disease on board.  Currently on  daptomycin , Zosyn , and micafungin .  Follow ID recommendations.  Plan for repeat CT scan as per ID and general surgery.   Atrial Fibrillation On IV Lopressor  and heparin  drip.  Overall rate controlled.   Severe protein calorie malnutrition Nutrition Status:Body mass index is 23.92 kg/m.  Nutrition Problem: Severe Malnutrition Etiology: chronic illness Signs/Symptoms: severe fat depletion, severe muscle depletion Interventions: Refer to RD note for recommendations, TPN.  Patient underwent gastrostomy tube placement.  Debility deconditioning patient frailty.  Continue PT.  PT has recommended skilled nursing facility placement.  Overall prognosis of the patient appears to be guarded.  DVT prophylaxis: SCD's Start: 05/08/24 1759   Disposition: As per PT, skilled nursing facility placement, likely in several days.  Status is: Inpatient  Remains inpatient appropriate because: Pending clinical improvement,   Code Status:     Code Status: Full Code  Family Communication:  Spoke with the patient's granddaughter on the phone on 06/06/2024   Consultants: General Surgery PCCM Infectious disease  Procedures: 5/29 elective colostomy  takedown per Dr. Sheldon  6/10 Transgluteal drain placed per IR 6/17 CT guided drain placed into the abdominal and pelvic abscesses yielding  pus and stool.   6/24 Lysis of adhesions, colostomy placement, drainage of abscess, and placement of G-tube,  6/25 extubated.  Anti-infectives:  Zosyn  IV Daptomycin  IV Micafungin  IV  Anti-infectives (From admission, onward)    Start     Dose/Rate Route Frequency Ordered Stop   06/06/24 1000  micafungin  (MYCAMINE ) 100 mg in sodium chloride  0.9 % 100 mL IVPB        100 mg 105 mL/hr over 1 Hours Intravenous Every 24 hours 06/05/24 1349 06/20/24 0959   06/04/24 1400  piperacillin -tazobactam (ZOSYN ) IVPB 3.375 g        3.375 g 12.5 mL/hr over 240 Minutes Intravenous Every 8 hours 06/04/24 0738 06/09/24 1359   06/04/24 1400  DAPTOmycin  (CUBICIN ) IVPB 500 mg/51mL premix        8 mg/kg  60.9 kg 100 mL/hr over 30 Minutes Intravenous Daily 06/04/24 0738 06/09/24 1359   06/03/24 1000  neomycin  (MYCIFRADIN ) tablet 500 mg  Status:  Discontinued        500 mg Oral 3 times daily 06/03/24 0935 06/03/24 1555   06/03/24 1000  metroNIDAZOLE  (FLAGYL ) tablet 500 mg  Status:  Discontinued       Note to Pharmacy: Take 2 pills (=1000mg ) by mouth at 1pm, 3pm, and 10pm the day before your colorectal operation   500 mg Oral 3 times daily 06/03/24 0935 06/03/24 1555   05/29/24 1500  DAPTOmycin  (CUBICIN ) IVPB 500 mg/50mL premix  Status:  Discontinued        8 mg/kg  60.9 kg 100 mL/hr over 30 Minutes Intravenous Daily 05/29/24 1359 06/04/24 0738   05/28/24 2100  vancomycin  (VANCOREADY) IVPB 750 mg/150 mL  Status:  Discontinued        750 mg 150 mL/hr over 60 Minutes Intravenous Every 12 hours 05/28/24 1419 05/29/24 1359   05/27/24 0800  vancomycin  (VANCOCIN ) IVPB 1000 mg/200 mL premix  Status:  Discontinued        1,000 mg 200 mL/hr over 60 Minutes Intravenous Every 24 hours 05/26/24 1144 05/28/24 1419   05/23/24 1400  piperacillin -tazobactam (ZOSYN ) IVPB 3.375 g  Status:  Discontinued        3.375 g 12.5 mL/hr over 240 Minutes Intravenous Every 8 hours 05/23/24 0804 06/04/24 0738   05/23/24 1200   micafungin  (MYCAMINE ) 100 mg in sodium chloride  0.9 % 100 mL IVPB        100 mg 105 mL/hr over 1 Hours Intravenous Every 24 hours 05/23/24 0755 06/05/24 1221   05/22/24 0800  vancomycin  (VANCOCIN ) IVPB 1000 mg/200 mL premix  Status:  Discontinued        1,000 mg 200 mL/hr over 60 Minutes Intravenous Every 24 hours 05/21/24 0634 05/26/24 0806   05/21/24 0730  vancomycin  (VANCOREADY) IVPB 1250 mg/250 mL        1,250 mg 166.7 mL/hr over 90 Minutes Intravenous  Once 05/21/24 0634 05/21/24 1043   05/19/24 1400  piperacillin -tazobactam (ZOSYN ) IVPB 3.375 g  Status:  Discontinued        3.375 g 12.5 mL/hr over 240 Minutes Intravenous Every 8 hours 05/19/24 1303 05/23/24 0804   05/09/24 0900  erythromycin  250 mg in sodium chloride  0.9 % 100 mL IVPB        250 mg 100 mL/hr over 60 Minutes Intravenous Every 8 hours 05/09/24 0732 05/11/24 0044   05/08/24 2200  cefoTEtan  (CEFOTAN )  2 g in sodium chloride  0.9 % 100 mL IVPB        2 g 200 mL/hr over 30 Minutes Intravenous Every 12 hours 05/08/24 1759 05/09/24 0749   05/08/24 1400  neomycin  (MYCIFRADIN ) tablet 1,000 mg  Status:  Discontinued       Placed in And Linked Group   1,000 mg Oral 3 times per day 05/08/24 1119 05/08/24 1120   05/08/24 1400  metroNIDAZOLE  (FLAGYL ) tablet 1,000 mg  Status:  Discontinued       Placed in And Linked Group   1,000 mg Oral 3 times per day 05/08/24 1119 05/08/24 1120   05/08/24 1130  cefoTEtan  (CEFOTAN ) 2 g in sodium chloride  0.9 % 100 mL IVPB        2 g 200 mL/hr over 30 Minutes Intravenous On call to O.R. 05/08/24 1119 05/09/24 0749      Subjective:  Today, patient was seen and examined at bedside.  Patient complains of mild abdominal pain.  Denies any nausea vomiting fever or chills.  More alert awake and Communicative.  Objective: Vitals:   06/06/24 2033 06/07/24 0532  BP: (!) 173/95 (!) 154/80  Pulse: 98 82  Resp: 20 20  Temp: 98.5 F (36.9 C) 98 F (36.7 C)  SpO2: 99% 98%    Intake/Output  Summary (Last 24 hours) at 06/07/2024 1101 Last data filed at 06/07/2024 0900 Gross per 24 hour  Intake 2741.73 ml  Output 775 ml  Net 1966.73 ml   Filed Weights   06/05/24 0500 06/06/24 0500 06/07/24 0500  Weight: 65.2 kg 65.2 kg 62.3 kg   Body mass index is 22.86 kg/m.   Physical Exam: GENERAL: Patient is awake, moderately Communicative, chronically ill deconditioned and weak.   Nasal cannula in place HENT: No scleral pallor or icterus. Pupils equally reactive to light. Oral mucosa is moist NECK: is supple, no gross swelling noted. CHEST: Diminished breath sounds bilaterally. CVS: S1 and S2 heard, no murmur. Regular rate and rhythm.  ABDOMEN: Soft, gastrostomy tube in place, abdominal drain present.  Urethral catheter in place.  Colostomy right upper quadrant. EXTREMITIES: Right upper extremity PICC line in place CNS: Communicative.  Moves extremities but generalized weakness noted. SKIN: warm and dry without rashes.  Abdominal incision with dressing  Data Review: I have personally reviewed the following laboratory data and studies,  CBC: Recent Labs  Lab 06/02/24 0255 06/04/24 1546 06/05/24 0429 06/06/24 0346 06/07/24 0423  WBC 9.0 12.4* 13.5* 11.7* 13.1*  HGB 9.0* 7.2* 7.1* 8.0* 8.6*  HCT 29.8* 23.8* 23.3* 25.8* 27.6*  MCV 92.3 89.8 95.5 90.5 89.9  PLT 424* 370 361 353 380   Basic Metabolic Panel: Recent Labs  Lab 06/02/24 0255 06/04/24 0609 06/05/24 0830 06/06/24 0346 06/07/24 0423  NA 132* 132* 138 135 133*  K 3.9 3.9 4.0 3.8 3.8  CL 101 101 107 104 101  CO2 23 22 22 22  21*  GLUCOSE 105* 151* 140* 121* 133*  BUN 18 23 21 21 19   CREATININE 0.71 0.77 0.55 0.61 0.47  CALCIUM  8.1* 7.7* 8.0* 8.0* 7.8*  MG 1.8 1.8 1.9 1.9 2.0  PHOS 3.3 2.8 1.9* 2.2* 2.5   Liver Function Tests: Recent Labs  Lab 06/02/24 0255 06/04/24 0609 06/05/24 0830  AST 25 34 26  ALT 26 23 21   ALKPHOS 183* 129* 146*  BILITOT 0.9 1.2 1.3*  PROT 7.1 6.2* 6.4*  ALBUMIN  1.6* 1.7*  1.7*   No results for input(s): LIPASE, AMYLASE in the last  168 hours. No results for input(s): AMMONIA in the last 168 hours. Cardiac Enzymes: Recent Labs  Lab 06/07/24 0423  CKTOTAL 19*    BNP (last 3 results) No results for input(s): BNP in the last 8760 hours.  ProBNP (last 3 results) No results for input(s): PROBNP in the last 8760 hours.  CBG: Recent Labs  Lab 06/06/24 0609 06/06/24 1132 06/06/24 1729 06/06/24 2358 06/07/24 0529  GLUCAP 115* 149* 125* 129* 136*   Recent Results (from the past 240 hours)  Surgical pcr screen     Status: Abnormal   Collection Time: 06/02/24  7:55 PM   Specimen: Nasal Mucosa; Nasal Swab  Result Value Ref Range Status   MRSA, PCR POSITIVE (A) NEGATIVE Final   Staphylococcus aureus POSITIVE (A) NEGATIVE Final    Comment: (NOTE) The Xpert SA Assay (FDA approved for NASAL specimens in patients 89 years of age and older), is one component of a comprehensive surveillance program. It is not intended to diagnose infection nor to guide or monitor treatment. Performed at [facility], [address] [place], [place] [address]      Studies: No results found.     Tamera Pingley, MD  [facility] 06/07/2024  If 7PM-7AM, please contact night-coverage

## 2024-06-07 NOTE — Plan of Care (Signed)
  Problem: Education: Goal: Verbalization of understanding of the causes of altered bowel function will improve Outcome: Progressing   Problem: Bowel/Gastric: Goal: Gastrointestinal status for postoperative course will improve Outcome: Progressing   Problem: Health Behavior/Discharge Planning: Goal: Identification of community resources to assist with postoperative recovery needs will improve Outcome: Progressing   Problem: Nutritional: Goal: Will attain and maintain optimal nutritional status will improve Outcome: Progressing   Problem: Clinical Measurements: Goal: Postoperative complications will be avoided or minimized Outcome: Progressing   Problem: Respiratory: Goal: Respiratory status will improve Outcome: Progressing   Problem: Skin Integrity: Goal: Will show signs of wound healing Outcome: Progressing   Problem: Education: Goal: Knowledge of General Education information will improve Description: Including pain rating scale, medication(s)/side effects and non-pharmacologic comfort measures Outcome: Progressing   Problem: Health Behavior/Discharge Planning: Goal: Ability to manage health-related needs will improve Outcome: Progressing   Problem: Clinical Measurements: Goal: Ability to maintain clinical measurements within normal limits will improve Outcome: Progressing Goal: Will remain free from infection Outcome: Progressing Goal: Diagnostic test results will improve Outcome: Progressing Goal: Respiratory complications will improve Outcome: Progressing Goal: Cardiovascular complication will be avoided Outcome: Progressing   Problem: Activity: Goal: Risk for activity intolerance will decrease Outcome: Progressing   Problem: Coping: Goal: Level of anxiety will decrease Outcome: Progressing   Problem: Elimination: Goal: Will not experience complications related to bowel motility Outcome: Progressing Goal: Will not experience complications related to  urinary retention Outcome: Progressing   Problem: Pain Managment: Goal: General experience of comfort will improve and/or be controlled Outcome: Progressing   Problem: Safety: Goal: Ability to remain free from injury will improve Outcome: Progressing   Problem: Skin Integrity: Goal: Risk for impaired skin integrity will decrease Outcome: Progressing   Problem: Education: Goal: Ability to describe self-care measures that may prevent or decrease complications (Diabetes Survival Skills Education) will improve Outcome: Progressing Goal: Individualized Educational Video(s) Outcome: Progressing   Problem: Coping: Goal: Ability to adjust to condition or change in health will improve Outcome: Progressing   Problem: Fluid Volume: Goal: Ability to maintain a balanced intake and output will improve Outcome: Progressing   Problem: Health Behavior/Discharge Planning: Goal: Ability to identify and utilize available resources and services will improve Outcome: Progressing Goal: Ability to manage health-related needs will improve Outcome: Progressing   Problem: Metabolic: Goal: Ability to maintain appropriate glucose levels will improve Outcome: Progressing   Problem: Nutritional: Goal: Maintenance of adequate nutrition will improve Outcome: Progressing Goal: Progress toward achieving an optimal weight will improve Outcome: Progressing   Problem: Skin Integrity: Goal: Risk for impaired skin integrity will decrease Outcome: Progressing   Problem: Tissue Perfusion: Goal: Adequacy of tissue perfusion will improve Outcome: Progressing   Problem: Activity: Goal: Ability to tolerate increased activity will improve Outcome: Progressing   Problem: Respiratory: Goal: Ability to maintain a clear airway and adequate ventilation will improve Outcome: Progressing   Problem: Role Relationship: Goal: Method of communication will improve Outcome: Progressing

## 2024-06-07 NOTE — Progress Notes (Signed)
 PHARMACY - TOTAL PARENTERAL NUTRITION CONSULT NOTE   Indication: Prolonged ileus  Patient Measurements: Height: 5' 5 (165.1 cm) Weight: 62.3 kg (137 lb 5.6 oz) IBW/kg (Calculated) : 57 TPN AdjBW (KG): 61.3 Body mass index is 22.86 kg/m.  Assessment: 37 yoF with history of diverticulitis with perforation and abscess. 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 She requested ostomy takedown and underwent LOA, colostomy takedown, small bowel resection with anastomosis on 5/29. Pharmacy is consulted to dose TPN starting 6/2 for postop ileus.  Post-op course complicated by anastomotic leak, multiple-intraabdominal abscesses.  Glucose / Insulin : no Hx DM, A1c 5.1%. BG goal <180 -CBG/SSI discontinued on 6/11, resumed on 6/25>> -BG 125 - 136 - SSI: 4 units/24h  Electrolytes: Na 133 on max Na concentration; Phos up to 2.5 after 30 mM Kphos yesterday, CoCa 9.64 , Mg 2 after 4 gm yesterday Hepatic: LFTs, Tbil 1.3 on 6/26 -Albumin  low but stable. Alk Phos slightley elevated -TG 174 (6/28) I/O: no mIVF -UOP: 700 mL 6/27 4 stools recorded  GI Imaging:  -6/16 CT: Similar appearance at the rectal anastomosis concerning for dehiscence -6/9 CT: Dempsey dehiscence along the rectal anastomosis, free air and multiple fluid collections - 6/23: poss catheter occlusion of PERC drain, several fistulous of sigmoid colon, gas collection, rectal anastomosis with dehiscence cavity communicating to small bowel, Multiple additional small loculated fluid collection at new percutaneous drainage catheter site. Additional small loculated fluid collection small bowel mesentery.  GI Surgeries / Procedures: -5/29: LAR, SBR, colostomy takedown, LOA -6/10 drains placed x 2 in IR (midline abd, transgluteal) -6/17: Replacement of drainage catheter by IR -6/24: diverting loop transverse colostomy, I&D of  abdominal abscess, gastrotomy tube placement, rectal tube placement.   Central access: Double lumen PICC placed 6/2 TPN start date: 6/2  Nutritional Goals: Goal TPN rate is 70 mL/hr (provides 94 g of protein and 1680 kcals per day)  RD Assessment:  Estimated Needs Total Energy Estimated Needs: 1600-1750 kcals Total Protein Estimated Needs: 80-95 grams Total Fluid Estimated Needs: >/= 1.6L  Current Nutrition:  -TPN at goal rate 41ml/hr  -Diet: NPO except for ice chips Multiple calorie counts this admission. Not meeting caloric needs. Refusing all supplements. Pt underwent G-tube placement 6/24.  Failed G tube feed trial 6/26 w/ abd distention & discomfort  Plan:  Continue TPN at goal rate of 70 mL/hr Electrolytes in TPN: No change Na 150 mEq/L (max) K 50 mEq/L Ca 5 mEq/L Mg 5 mEq/L Phos 15 mmol/L Cl:Ac 1:1 Continue MVI in TPN DC SSI & CBGs while NPO Monitor TPN labs on Mon/Thurs, and as needed.  Rosaline IVAR Edison, Pharm.D Use secure chat for questions 06/07/2024 8:56 AM

## 2024-06-07 NOTE — Progress Notes (Signed)
 4 Days Post-Op   Subjective/Chief Complaint: Does not feel well today.  No specific complaints.   Objective: Vital signs in last 24 hours: Temp:  [98 F (36.7 C)-98.5 F (36.9 C)] 98 F (36.7 C) (06/28 0532) Pulse Rate:  [81-98] 82 (06/28 0532) Resp:  [16-20] 20 (06/28 0532) BP: (149-173)/(80-95) 154/80 (06/28 0532) SpO2:  [98 %-100 %] 98 % (06/28 0532) Weight:  [62.3 kg] 62.3 kg (06/28 0500) Last BM Date : 06/06/24  Intake/Output from previous day: 06/27 0701 - 06/28 0700 In: 2741.7 [I.V.:1941.9; IV Piggyback:799.9] Out: 700 [Urine:700] Intake/Output this shift: No intake/output data recorded.  Alert, somewhat lethargic Unlabored respirations Abdomen is distended, diffusely tender.  Stoma with sweat in the bag, drain with sanguino-serous output; G-tube with minimal output, light bilious  Lab Results:  Recent Labs    06/06/24 0346 06/07/24 0423  WBC 11.7* 13.1*  HGB 8.0* 8.6*  HCT 25.8* 27.6*  PLT 353 380   BMET Recent Labs    06/06/24 0346 06/07/24 0423  NA 135 133*  K 3.8 3.8  CL 104 101  CO2 22 21*  GLUCOSE 121* 133*  BUN 21 19  CREATININE 0.61 0.47  CALCIUM  8.0* 7.8*   PT/INR No results for input(s): LABPROT, INR in the last 72 hours. ABG No results for input(s): PHART, HCO3 in the last 72 hours.  Invalid input(s): PCO2, PO2  Studies/Results: No results found.  Anti-infectives: Anti-infectives (From admission, onward)    Start     Dose/Rate Route Frequency Ordered Stop   06/06/24 1000  micafungin  (MYCAMINE ) 100 mg in sodium chloride  0.9 % 100 mL IVPB        100 mg 105 mL/hr over 1 Hours Intravenous Every 24 hours 06/05/24 1349 06/20/24 0959   06/04/24 1400  piperacillin -tazobactam (ZOSYN ) IVPB 3.375 g        3.375 g 12.5 mL/hr over 240 Minutes Intravenous Every 8 hours 06/04/24 0738 06/09/24 1359   06/04/24 1400  DAPTOmycin  (CUBICIN ) IVPB 500 mg/48mL premix        8 mg/kg  60.9 kg 100 mL/hr over 30 Minutes Intravenous Daily  06/04/24 0738 06/09/24 1359   06/03/24 1000  neomycin  (MYCIFRADIN ) tablet 500 mg  Status:  Discontinued        500 mg Oral 3 times daily 06/03/24 0935 06/03/24 1555   06/03/24 1000  metroNIDAZOLE  (FLAGYL ) tablet 500 mg  Status:  Discontinued       Note to Pharmacy: Take 2 pills (=1000mg ) by mouth at 1pm, 3pm, and 10pm the day before your colorectal operation   500 mg Oral 3 times daily 06/03/24 0935 06/03/24 1555   05/29/24 1500  DAPTOmycin  (CUBICIN ) IVPB 500 mg/50mL premix  Status:  Discontinued        8 mg/kg  60.9 kg 100 mL/hr over 30 Minutes Intravenous Daily 05/29/24 1359 06/04/24 0738   05/28/24 2100  vancomycin  (VANCOREADY) IVPB 750 mg/150 mL  Status:  Discontinued        750 mg 150 mL/hr over 60 Minutes Intravenous Every 12 hours 05/28/24 1419 05/29/24 1359   05/27/24 0800  vancomycin  (VANCOCIN ) IVPB 1000 mg/200 mL premix  Status:  Discontinued        1,000 mg 200 mL/hr over 60 Minutes Intravenous Every 24 hours 05/26/24 1144 05/28/24 1419   05/23/24 1400  piperacillin -tazobactam (ZOSYN ) IVPB 3.375 g  Status:  Discontinued        3.375 g 12.5 mL/hr over 240 Minutes Intravenous Every 8 hours 05/23/24 0804 06/04/24 9261  05/23/24 1200  micafungin  (MYCAMINE ) 100 mg in sodium chloride  0.9 % 100 mL IVPB        100 mg 105 mL/hr over 1 Hours Intravenous Every 24 hours 05/23/24 0755 06/05/24 1221   05/22/24 0800  vancomycin  (VANCOCIN ) IVPB 1000 mg/200 mL premix  Status:  Discontinued        1,000 mg 200 mL/hr over 60 Minutes Intravenous Every 24 hours 05/21/24 0634 05/26/24 0806   05/21/24 0730  vancomycin  (VANCOREADY) IVPB 1250 mg/250 mL        1,250 mg 166.7 mL/hr over 90 Minutes Intravenous  Once 05/21/24 0634 05/21/24 1043   05/19/24 1400  piperacillin -tazobactam (ZOSYN ) IVPB 3.375 g  Status:  Discontinued        3.375 g 12.5 mL/hr over 240 Minutes Intravenous Every 8 hours 05/19/24 1303 05/23/24 0804   05/09/24 0900  erythromycin  250 mg in sodium chloride  0.9 % 100 mL IVPB         250 mg 100 mL/hr over 60 Minutes Intravenous Every 8 hours 05/09/24 0732 05/11/24 0044   05/08/24 2200  cefoTEtan  (CEFOTAN ) 2 g in sodium chloride  0.9 % 100 mL IVPB        2 g 200 mL/hr over 30 Minutes Intravenous Every 12 hours 05/08/24 1759 05/09/24 0749   05/08/24 1400  neomycin  (MYCIFRADIN ) tablet 1,000 mg  Status:  Discontinued       Placed in And Linked Group   1,000 mg Oral 3 times per day 05/08/24 1119 05/08/24 1120   05/08/24 1400  metroNIDAZOLE  (FLAGYL ) tablet 1,000 mg  Status:  Discontinued       Placed in And Linked Group   1,000 mg Oral 3 times per day 05/08/24 1119 05/08/24 1120   05/08/24 1130  cefoTEtan  (CEFOTAN ) 2 g in sodium chloride  0.9 % 100 mL IVPB        2 g 200 mL/hr over 30 Minutes Intravenous On call to O.R. 05/08/24 1119 05/09/24 0749       Assessment/Plan: 83 year old woman status post robotic LAR with takedown of end colostomy and anastomosis, small bowel resection and small bowel repair along with 2 hours of adhesiolysis on 05/08/2024  Complicated by anastomotic leak necessitating drain placement 6/10 and 6/18 and ultimately diverting colostomy + G-tube placement 6/24  Failure to thrive, severe malnutrition, intraperitoneal abscess -Continue TPN, will place G-tube to gravity as opposed to suction, sips for comfort Continue broad-spectrum antibiotics On heparin  for A-fib; hemoglobin is stable; drain output does look a little bit bloody Continue Foley for now Mobilize as tolerated   Principal Problem:   Diverticular disease Active Problems:   A-fib (HCC)   Restless leg   ABLA (acute blood loss anemia)   Need for emotional support   Acute urinary retention   Mixed hyperlipidemia   Essential hypertension   Diverticular disease of left colon   History of DVT (deep vein thrombosis)   History of pulmonary embolus (PE)   Pre-diabetes   Presence of IVC filter   Stricture of sigmoid colon (HCC)   Protein-calorie malnutrition, severe    Hypokalemia   LOS: 30 days    Elizabeth Odom 06/07/2024

## 2024-06-07 NOTE — Progress Notes (Signed)
 PHARMACY - ANTICOAGULATION CONSULT NOTE  Pharmacy Consult for heparin  Indication: atrial fibrillation (apixaban  on hold)  Allergies  Allergen Reactions   Elemental Sulfur Anaphylaxis    Patient Measurements: Height: 5' 5 (165.1 cm) Weight: 62.3 kg (137 lb 5.6 oz) IBW/kg (Calculated) : 57 HEPARIN  DW (KG): 61.3  Vital Signs: Temp: 98 F (36.7 C) (06/28 0532) Temp Source: Oral (06/28 0532) BP: 154/80 (06/28 0532) Pulse Rate: 82 (06/28 0532)  Labs: Recent Labs    06/04/24 1522 06/04/24 1523 06/05/24 0429 06/05/24 0830 06/05/24 1530 06/06/24 0346 06/06/24 1340 06/06/24 1937 06/07/24 0423  HGB  --    < > 7.1*  --   --  8.0*  --   --  8.6*  HCT  --    < > 23.3*  --   --  25.8*  --   --  27.6*  PLT  --    < > 361  --   --  353  --   --  380  APTT 41*  --  40*  --   --   --   --   --   --   HEPARINUNFRC  --    < > 0.19*  --    < > 0.29* 0.37 0.34 0.30  CREATININE  --   --   --  0.55  --  0.61  --   --  0.47  CKTOTAL  --   --   --   --   --   --   --   --  19*   < > = values in this interval not displayed.    Estimated Creatinine Clearance: 47.9 mL/min (by C-G formula based on SCr of 0.47 mg/dL).  Medications: Pt was not on anticoagulants PTA, however, apixaban  was started during admission for atrial fibrillation.  -Apixaban  2.5 mg PO BID, last dose given 6/23 @ 21:30  Assessment: Pt is an 41 yoF who was started on apixaban  during this admission for atrial fibrillation. Pt underwent surgery on 6/24 - placement of G-tube, I&D of abscess, diverting colostomy. CCS recommending heparin  drip post-op until pt having BM.   Pharmacy consulted to dose heparin  for afib while apixaban  on hold.   06/07/2024 Heparin  level 0.30 - therapeutic on 1550 units/hr Hg 8.6 - stable  (transfused on 6/26) PLT remains WNL No bleeding reported  Goal of Therapy:  Heparin  level 0.3-0.7 units/ml aPTT 66-102 seconds Monitor platelets by anticoagulation protocol: Yes   Plan:  continue  heparin  drip @ 1550 units/hr CBC, heparin  level daily Monitor for signs of bleeding Follow for ability to resume apixaban  - currently NPO  Rosaline IVAR Edison, Pharm.D Use secure chat for questions 06/07/2024 8:39 AM

## 2024-06-08 DIAGNOSIS — K5790 Diverticulosis of intestine, part unspecified, without perforation or abscess without bleeding: Secondary | ICD-10-CM | POA: Diagnosis not present

## 2024-06-08 LAB — BASIC METABOLIC PANEL WITH GFR
Anion gap: 7 (ref 5–15)
BUN: 23 mg/dL (ref 8–23)
CO2: 22 mmol/L (ref 22–32)
Calcium: 7.6 mg/dL — ABNORMAL LOW (ref 8.9–10.3)
Chloride: 104 mmol/L (ref 98–111)
Creatinine, Ser: 0.43 mg/dL — ABNORMAL LOW (ref 0.44–1.00)
GFR, Estimated: >60 mL/min (ref >60)
Glucose, Bld: 124 mg/dL — ABNORMAL HIGH (ref 70–99)
Potassium: 3.4 mmol/L — ABNORMAL LOW (ref 3.5–5.1)
Sodium: 133 mmol/L — ABNORMAL LOW (ref 135–145)

## 2024-06-08 LAB — CBC
HCT: 26 % — ABNORMAL LOW (ref 36.0–46.0)
Hemoglobin: 8 g/dL — ABNORMAL LOW (ref 12.0–15.0)
MCH: 28.5 pg (ref 26.0–34.0)
MCHC: 30.8 g/dL (ref 30.0–36.0)
MCV: 92.5 fL (ref 80.0–100.0)
Platelets: 319 10*3/uL (ref 150–400)
RBC: 2.81 MIL/uL — ABNORMAL LOW (ref 3.87–5.11)
RDW: 16.8 % — ABNORMAL HIGH (ref 11.5–15.5)
WBC: 10.3 10*3/uL (ref 4.0–10.5)
nRBC: 0.3 % — ABNORMAL HIGH (ref 0.0–0.2)

## 2024-06-08 LAB — GLUCOSE, CAPILLARY
Glucose-Capillary: 129 mg/dL — ABNORMAL HIGH (ref 70–99)
Glucose-Capillary: 129 mg/dL — ABNORMAL HIGH (ref 70–99)
Glucose-Capillary: 140 mg/dL — ABNORMAL HIGH (ref 70–99)

## 2024-06-08 LAB — HEPARIN LEVEL (UNFRACTIONATED): Heparin Unfractionated: 0.39 [IU]/mL (ref 0.30–0.70)

## 2024-06-08 MED ORDER — TRAVASOL 10 % IV SOLN
INTRAVENOUS | Status: AC
  Filled 2024-06-08: qty 940.8

## 2024-06-08 MED ORDER — CHLORHEXIDINE GLUCONATE CLOTH 2 % EX PADS
6.0000 | MEDICATED_PAD | Freq: Every day | CUTANEOUS | Status: DC
  Administered 2024-06-08 – 2024-06-25 (×18): 6 via TOPICAL

## 2024-06-08 MED ORDER — POTASSIUM CHLORIDE 10 MEQ/50ML IV SOLN
10.0000 meq | INTRAVENOUS | Status: AC
  Administered 2024-06-08 (×4): 10 meq via INTRAVENOUS
  Filled 2024-06-08 (×4): qty 50

## 2024-06-08 MED ORDER — POTASSIUM CHLORIDE 10 MEQ/100ML IV SOLN: 10.0000 meq | INTRAVENOUS | Status: DC

## 2024-06-08 NOTE — Progress Notes (Signed)
 PHARMACY - TOTAL PARENTERAL NUTRITION CONSULT NOTE   Indication: Prolonged ileus  Patient Measurements: Height: 5' 5 (165.1 cm) Weight: 64.8 kg (142 lb 13.7 oz) IBW/kg (Calculated) : 57 TPN AdjBW (KG): 61.3 Body mass index is 23.77 kg/m.  Assessment: 55 yoF with history of diverticulitis with perforation and abscess. 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 She requested ostomy takedown and underwent LOA, colostomy takedown, small bowel resection with anastomosis on 5/29. Pharmacy is consulted to dose TPN starting 6/2 for postop ileus.  Post-op course complicated by anastomotic leak, multiple-intraabdominal abscesses.  Glucose / Insulin : no Hx DM, A1c 5.1%. BG goal <180 -CBG/SSI discontinued on 6/11, resumed on 6/25 and discontinued 6/18 while NPO 6/29 serum glucose 124 Electrolytes: Na 133 on max Na concentration; K 3.4; CorrCa remains WNL Hepatic: LFTs, Tbil 1.3 on 6/26 -Albumin  low but stable. Alk Phos slightley elevated -TG 174 (6/28) I/O: no mIVF -UOP: 1275 mL Stool: 250 ml Drain: 55 ml GI Imaging:  -6/16 CT: Similar appearance at the rectal anastomosis concerning for dehiscence -6/9 CT: Dempsey dehiscence along the rectal anastomosis, free air and multiple fluid collections - 6/23: poss catheter occlusion of PERC drain, several fistulous of sigmoid colon, gas collection, rectal anastomosis with dehiscence cavity communicating to small bowel, Multiple additional small loculated fluid collection at new percutaneous drainage catheter site. Additional small loculated fluid collection small bowel mesentery.  GI Surgeries / Procedures: -5/29: LAR, SBR, colostomy takedown, LOA -6/10 drains placed x 2 in IR (midline abd, transgluteal) -6/17: Replacement of drainage catheter by IR -6/24: diverting loop transverse colostomy, I&D of abdominal abscess, gastrotomy tube  placement, rectal tube placement.   Central access: Double lumen PICC placed 6/2 TPN start date: 6/2  Nutritional Goals: Goal TPN rate is 70 mL/hr (provides 94 g of protein and 1680 kcals per day)  RD Assessment:  Estimated Needs Total Energy Estimated Needs: 1600-1750 kcals Total Protein Estimated Needs: 80-95 grams Total Fluid Estimated Needs: >/= 1.6L  Current Nutrition:  -TPN at goal rate 64ml/hr  -Diet: NPO except for ice chips Multiple calorie counts this admission. Not meeting caloric needs. Refusing all supplements. Pt underwent G-tube placement 6/24.  Failed G tube feed trial 6/26 w/ abd distention & discomfort 6/27 G tube to suction 6/28 G tube to gravity  Plan:  Now: 4 runs K ordered by TRH Continue TPN at goal rate of 70 mL/hr Electrolytes in TPN: No change Na 150 mEq/L (max) K 50 mEq/L Ca 5 mEq/L Mg 5 mEq/L Phos 15 mmol/L Cl:Ac 1:1 Continue MVI in TPN Monitor TPN labs on Mon/Thurs, and as needed.  Rosaline IVAR Edison, Pharm.D Use secure chat for questions 06/08/2024 9:12 AM

## 2024-06-08 NOTE — Consult Note (Signed)
 Consultation Note Date: 06/08/2024   Patient Name: Elizabeth Odom  DOB: Mar 05, 1941  MRN: [id number]  Age / Sex: 83 y.o., female  PCP: Jesus Bernardino MATSU, MD Referring Physician: Sonjia Held, MD  Reason for Consultation: Establishing goals of care  HPI/Patient Profile: 83 y.o. female admitted on 05/08/2024.   Clinical Assessment and Goals of Care: 83 year old lady status post robotic LAR with takedown of end colostomy and anastomosis, small bowel resection and small bowel repair along with lysis of adhesions done on 05-08-2024 Initial surgery complicated by anastomotic leak necessitating drain placement on 610 and 618, now status post colostomy and G-tube placement 6-24 Failure to thrive severe malnutrition intraperitoneal abscess Remains on hospital medicine service and surgery colleagues following closely Remains on TPN Remains on broad-spectrum antibiotics Has opioids for pain control Palliative consult for ongoing goals of care discussions has been requested. Chart reviewed Patient seen and examined Call placed and was able to reach [facility] granddaughter Augustin Patient has advanced care planning documents on the chart noting granddaughter Augustin is her HCPOA Prior palliative consult notes also reviewed  Palliative medicine is specialized medical care for people living with serious illness. It focuses on providing relief from the symptoms and stress of a serious illness. The goal is to improve quality of life for both the patient and the family.  Goals of care: Broad aims of medical therapy in relation to the patient's values and preferences. Our aim is to provide medical care aimed at enabling patients to achieve the goals that matter most to them, given the circumstances of their particular medical situation and their constraints.    HCPOA Granddaughter Augustin  SUMMARY OF RECOMMENDATIONS   Full  Code/ Full scope care Okay with current opioid regimen Monitor hospital course  Code Status/Advance Care Planning: Full code   Symptom Management:     Palliative Prophylaxis:  Delirium Protocol  Additional Recommendations (Limitations, Scope, Preferences): Full Scope Treatment  Psycho-social/Spiritual:  Desire for further Chaplaincy support:yes Additional Recommendations: Caregiving  Support/Resources  Prognosis:  Unable to determine  Discharge Planning: To Be Determined      Primary Diagnoses: Present on Admission:  Diverticular disease  A-fib (HCC)  Restless leg  ABLA (acute blood loss anemia)  Need for emotional support  Acute urinary retention  Mixed hyperlipidemia  Essential hypertension  Diverticular disease of left colon  History of pulmonary embolus (PE)  Pre-diabetes  Stricture of sigmoid colon (HCC)   I have reviewed the medical record, interviewed the patient and family, and examined the patient. The following aspects are pertinent.  Past Medical History:  Diagnosis Date   A-fib (HCC) 04/09/2016   Acute head injury 04/09/2016   Anemia    Blood dyscrasia    Hx DVT / PE   Cervical cancer (HCC)    Cervical spine fracture, initial encounter 04/09/2016   Claustrophobia    extremely claustrophobic   Dupuytren disease of palm of both hands    Essential hypertension    Fatty liver    History of kidney stones  MCI (mild cognitive impairment) with memory loss    Multiple fractures of cervical spine (HCC) 04/09/2016   Restless leg    Syncope and collapse 04/09/2016   Social History   Socioeconomic History   Marital status: Widowed    Spouse name: Not on file   Number of children: 3   Years of education: schooling in [place]   Highest education level: Not on file  Occupational History   Not on file  Tobacco Use   Smoking status: Never   Smokeless tobacco: Never  Vaping Use   Vaping status: Never Used  Substance and Sexual Activity    Alcohol  use: Never    Alcohol /week: 0.0 standard drinks of alcohol    Drug use: Never   Sexual activity: Not Currently  Other Topics Concern   Not on file  Social History Narrative   Patient is originally from [place], [place].   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    Her emergency contact/medical decision maker is her granddaughter:  [name] [phone]).   Social Drivers of Corporate investment banker Strain: Not on file  Food Insecurity: No Food Insecurity (05/09/2024)   Hunger Vital Sign    Worried About Running Out of Food in the Last Year: Never true    Ran Out of Food in the Last Year: Never true  Transportation Needs: No Transportation Needs (05/09/2024)   PRAPARE - Administrator, Civil Service (Medical): No    Lack of Transportation (Non-Medical): No  Physical Activity: Not on file  Stress: Not on file  Social Connections: Unknown (05/09/2024)   Social Connection and Isolation Panel    Frequency of Communication with Friends and Family: Three times a week    Frequency of Social Gatherings with Friends and Family: Once a week    Attends Religious Services: 1 to 4 times per year    Active Member of [employer] or Organizations: Yes    Attends Banker Meetings: 1 to 4 times per year    Marital Status: Patient declined   Family History  Problem Relation Age of Onset   Other Mother        thrombosis   Cancer Sister    Diabetes Sister    Stroke Daughter    Thyroid  disease Daughter    Scheduled Meds:  acetaminophen   1,000 mg Per Tube Q6H   Chlorhexidine  Gluconate Cloth  6 each Topical Daily   methocarbamol  (ROBAXIN ) injection  500 mg Intravenous Q8H   mupirocin  ointment  1 Application Nasal BID   nystatin    Topical BID   mouth rinse  15 mL Mouth Rinse 4 times per day   pantoprazole  (PROTONIX ) IV  40 mg Intravenous Daily   sodium chloride  flush  10-40 mL Intracatheter Q12H    sodium chloride  flush  3 mL Intravenous Q12H   Continuous Infusions:  sodium chloride  10 mL/hr at 05/26/24 2028   DAPTOmycin  Stopped (06/07/24 2035)   heparin  1,550 Units/hr (06/08/24 0610)   iron  sucrose 440 mL/hr at 06/06/24 1722   micafungin  (MYCAMINE ) 100 mg in sodium chloride  0.9 % 100 mL IVPB 100 mg (06/08/24 1020)   piperacillin -tazobactam (ZOSYN )  IV 3.375 g (06/08/24 1352)   potassium chloride  10 mEq (06/08/24 1342)   TPN ADULT (ION) 70 mL/hr at 06/08/24 0610   TPN ADULT (ION)     PRN Meds:.sodium chloride , alum & mag hydroxide-simeth, diphenhydrAMINE -zinc  acetate, hydrALAZINE , HYDROmorphone  (DILAUDID )  injection, lip balm, menthol -cetylpyridinium, metoprolol  tartrate, naphazoline-glycerin , [DISCONTINUED] ondansetron  **OR** ondansetron  (ZOFRAN ) IV, oxyCODONE , phenol, [DISCONTINUED] prochlorperazine  **OR** prochlorperazine , simethicone , sodium chloride , sodium chloride  flush, witch hazel-glycerin  Medications Prior to Admission:  Prior to Admission medications   Medication Sig Start Date End Date Taking? Authorizing Provider  amLODipine  (NORVASC ) 5 MG tablet Take 1 tablet (5 mg total) by mouth daily. Patient taking differently: Take 5 mg by mouth at bedtime. 04/08/24  Yes Patwardhan, Manish J, MD  ascorbic acid  (VITAMIN C ) 250 MG CHEW Chew 250 mg by mouth daily.   Yes [provider]  aspirin  EC 81 MG tablet Take 81 mg by mouth daily. Swallow whole.   Yes [provider]  cyanocobalamin  (VITAMIN B12) 1000 MCG tablet Take 1,000 mcg by mouth daily.   Yes [provider]  FIBER GUMMIES PO Take 1 capsule by mouth daily.   Yes [provider]  Multiple Vitamin (MULTIVITAMIN WITH MINERALS) TABS tablet Take 1 tablet by mouth daily. 11/10/23  Yes Tobie Yetta HERO, MD  Omega-3 Fatty Acids (OMEGA 3 500 PO) Take 500 mg by mouth daily.   Yes [provider]  OVER THE COUNTER MEDICATION Take 650 mg by mouth daily. Total Beets supplement   Yes [provider]  traMADol  (ULTRAM ) 50 MG tablet Take 1-2 tablets (50-100 mg total) by mouth every 6 (six) hours as needed for moderate pain (pain score 4-6) or severe pain (pain score 7-10). 05/08/24  Yes [name], MD   Allergies  Allergen Reactions   Elemental Sulfur Anaphylaxis   Review of Systems Complains of pain Physical Exam Awake resting in bed Nonlabored respirations Abdomen with mild distention Has ostomy Has G-tube  Vital Signs: BP (!) 152/85 (BP Location: Left Arm)   Pulse 87   Temp 97.8 F (36.6 C) (Oral)   Resp (!) 22   Ht 5' 5 (1.651 m)   Wt 64.8 kg   SpO2 100%   BMI 23.77 kg/m  Pain Scale: 0-10 POSS *See Group Information*: 1-Acceptable,Awake and alert Pain Score: Asleep   SpO2: SpO2: 100 % O2 Device:SpO2: 100 % O2 Flow Rate: .O2 Flow Rate (L/min): 4 L/min  IO: Intake/output summary:  Intake/Output Summary (Last 24 hours) at 06/08/2024 1428 Last data filed at 06/08/2024 1020 Gross per 24 hour  Intake 1813.23 ml  Output 1305 ml  Net 508.23 ml    LBM: Last BM Date : 06/08/24 Baseline Weight: Weight: 57.6 kg Most recent weight: Weight: 64.8 kg     Palliative Assessment/Data:   Palliative performance scale 50%  Time In: 1300 Time Out: 1400 Time Total: 60 Greater than 50%  of this time was spent counseling and coordinating care related to the above assessment and plan.  Signed by: Lonia Serve, MD   Please contact Palliative Medicine Team phone at [phone] for questions and concerns.  For individual provider: See Tracey

## 2024-06-08 NOTE — Progress Notes (Addendum)
 PHARMACY - ANTICOAGULATION CONSULT NOTE  Pharmacy Consult for heparin  Indication: atrial fibrillation (apixaban  on hold)  Allergies  Allergen Reactions   Elemental Sulfur Anaphylaxis    Patient Measurements: Height: 5' 5 (165.1 cm) Weight: 64.8 kg (142 lb 13.7 oz) IBW/kg (Calculated) : 57 HEPARIN  DW (KG): 61.3  Vital Signs: Temp: 97.9 F (36.6 C) (06/29 0615) Temp Source: Axillary (06/29 0615) BP: 133/89 (06/29 0615) Pulse Rate: 93 (06/29 0615)  Labs: Recent Labs    06/06/24 0346 06/06/24 1340 06/06/24 1937 06/07/24 0423 06/08/24 0406  HGB 8.0*  --   --  8.6* 8.0*  HCT 25.8*  --   --  27.6* 26.0*  PLT 353  --   --  380 319  HEPARINUNFRC 0.29*   < > 0.34 0.30 0.39  CREATININE 0.61  --   --  0.47 0.43*  CKTOTAL  --   --   --  19*  --    < > = values in this interval not displayed.    Estimated Creatinine Clearance: 47.9 mL/min (A) (by C-G formula based on SCr of 0.43 mg/dL (L)).  Medications: Pt was not on anticoagulants PTA, however, apixaban  was started during admission for atrial fibrillation.  -Apixaban  2.5 mg PO BID, last dose given 6/23 @ 21:30  Assessment: Pt is an 46 yoF who was started on apixaban  during this admission for atrial fibrillation. Pt underwent surgery on 6/24 - placement of G-tube, I&D of abscess, diverting colostomy. CCS recommending heparin  drip post-op until pt having BM.   Pharmacy consulted to dose heparin  for afib while apixaban  on hold.   06/08/2024 Heparin  level 0.43 - remains therapeutic on 1550 units/hr Hg 8.0 - stable  (transfused on 6/26) On 6/28 CCS noted drain with sanguino-serous output  Goal of Therapy:  Heparin  level 0.3-0.7 units/ml aPTT 66-102 seconds Monitor platelets by anticoagulation protocol: Yes   Plan:  continue heparin  drip @ 1550 units/hr CBC, heparin  level daily Monitor for signs of bleeding Follow for ability to resume apixaban  - currently NPO  Rosaline IVAR Edison, Pharm.D Use secure chat for  questions 06/08/2024 9:09 AM

## 2024-06-08 NOTE — Plan of Care (Signed)
  Problem: Education: Goal: Verbalization of understanding of the causes of altered bowel function will improve Outcome: Progressing   Problem: Bowel/Gastric: Goal: Gastrointestinal status for postoperative course will improve Outcome: Progressing   Problem: Health Behavior/Discharge Planning: Goal: Identification of community resources to assist with postoperative recovery needs will improve Outcome: Progressing   Problem: Nutritional: Goal: Will attain and maintain optimal nutritional status will improve Outcome: Progressing   Problem: Clinical Measurements: Goal: Postoperative complications will be avoided or minimized Outcome: Progressing   Problem: Respiratory: Goal: Respiratory status will improve Outcome: Progressing   Problem: Skin Integrity: Goal: Will show signs of wound healing Outcome: Progressing   Problem: Education: Goal: Knowledge of General Education information will improve Description: Including pain rating scale, medication(s)/side effects and non-pharmacologic comfort measures Outcome: Progressing   Problem: Health Behavior/Discharge Planning: Goal: Ability to manage health-related needs will improve Outcome: Progressing   Problem: Clinical Measurements: Goal: Ability to maintain clinical measurements within normal limits will improve Outcome: Progressing Goal: Will remain free from infection Outcome: Progressing Goal: Diagnostic test results will improve Outcome: Progressing Goal: Respiratory complications will improve Outcome: Progressing Goal: Cardiovascular complication will be avoided Outcome: Progressing   Problem: Activity: Goal: Risk for activity intolerance will decrease Outcome: Progressing   Problem: Coping: Goal: Level of anxiety will decrease Outcome: Progressing   Problem: Elimination: Goal: Will not experience complications related to bowel motility Outcome: Progressing Goal: Will not experience complications related to  urinary retention Outcome: Progressing   Problem: Pain Managment: Goal: General experience of comfort will improve and/or be controlled Outcome: Progressing   Problem: Safety: Goal: Ability to remain free from injury will improve Outcome: Progressing   Problem: Skin Integrity: Goal: Risk for impaired skin integrity will decrease Outcome: Progressing   Problem: Education: Goal: Ability to describe self-care measures that may prevent or decrease complications (Diabetes Survival Skills Education) will improve Outcome: Progressing Goal: Individualized Educational Video(s) Outcome: Progressing   Problem: Coping: Goal: Ability to adjust to condition or change in health will improve Outcome: Progressing   Problem: Fluid Volume: Goal: Ability to maintain a balanced intake and output will improve Outcome: Progressing   Problem: Health Behavior/Discharge Planning: Goal: Ability to identify and utilize available resources and services will improve Outcome: Progressing Goal: Ability to manage health-related needs will improve Outcome: Progressing   Problem: Metabolic: Goal: Ability to maintain appropriate glucose levels will improve Outcome: Progressing   Problem: Nutritional: Goal: Maintenance of adequate nutrition will improve Outcome: Progressing Goal: Progress toward achieving an optimal weight will improve Outcome: Progressing   Problem: Skin Integrity: Goal: Risk for impaired skin integrity will decrease Outcome: Progressing   Problem: Tissue Perfusion: Goal: Adequacy of tissue perfusion will improve Outcome: Progressing   Problem: Activity: Goal: Ability to tolerate increased activity will improve Outcome: Progressing   Problem: Respiratory: Goal: Ability to maintain a clear airway and adequate ventilation will improve Outcome: Progressing   Problem: Role Relationship: Goal: Method of communication will improve Outcome: Progressing

## 2024-06-08 NOTE — Progress Notes (Signed)
 5 Days Post-Op   Subjective/Chief Complaint: Reports continued pain today. G tube remains on suction, minimal output. Colostomy is productive.   Objective: Vital signs in last 24 hours: Temp:  [97.6 F (36.4 C)-98.3 F (36.8 C)] 97.9 F (36.6 C) (06/29 0615) Pulse Rate:  [77-93] 93 (06/29 0615) Resp:  [16-22] 22 (06/29 0615) BP: (133-144)/(86-96) 133/89 (06/29 0615) SpO2:  [99 %-100 %] 100 % (06/29 0615) Weight:  [64.8 kg] 64.8 kg (06/29 0442) Last BM Date : 06/08/24  Intake/Output from previous day: 06/28 0701 - 06/29 0700 In: 1803.2 [I.V.:1441.3; IV Piggyback:261.9] Out: 1580 [Urine:1275; Drains:55; Stool:250] Intake/Output this shift: No intake/output data recorded.  Mildly lethargic but answers questions Unlabored respirations Abdomen is mildly distended, tender.  Stoma is pink with soft stool in bag. JP serosanguinous. LUQ G tube to suction, minimal nonbilious fluid in tubing.  Lab Results:  Recent Labs    06/07/24 0423 06/08/24 0406  WBC 13.1* 10.3  HGB 8.6* 8.0*  HCT 27.6* 26.0*  PLT 380 319   BMET Recent Labs    06/07/24 0423 06/08/24 0406  NA 133* 133*  K 3.8 3.4*  CL 101 104  CO2 21* 22  GLUCOSE 133* 124*  BUN 19 23  CREATININE 0.47 0.43*  CALCIUM  7.8* 7.6*   PT/INR No results for input(s): LABPROT, INR in the last 72 hours. ABG No results for input(s): PHART, HCO3 in the last 72 hours.  Invalid input(s): PCO2, PO2  Studies/Results: No results found.  Anti-infectives: Anti-infectives (From admission, onward)    Start     Dose/Rate Route Frequency Ordered Stop   06/06/24 1000  micafungin  (MYCAMINE ) 100 mg in sodium chloride  0.9 % 100 mL IVPB        100 mg 105 mL/hr over 1 Hours Intravenous Every 24 hours 06/05/24 1349 06/20/24 0959   06/04/24 1400  piperacillin -tazobactam (ZOSYN ) IVPB 3.375 g        3.375 g 12.5 mL/hr over 240 Minutes Intravenous Every 8 hours 06/04/24 0738 06/09/24 1359   06/04/24 1400  DAPTOmycin   (CUBICIN ) IVPB 500 mg/51mL premix        8 mg/kg  60.9 kg 100 mL/hr over 30 Minutes Intravenous Daily 06/04/24 0738 06/09/24 1959   06/03/24 1000  neomycin  (MYCIFRADIN ) tablet 500 mg  Status:  Discontinued        500 mg Oral 3 times daily 06/03/24 0935 06/03/24 1555   06/03/24 1000  metroNIDAZOLE  (FLAGYL ) tablet 500 mg  Status:  Discontinued       Note to Pharmacy: Take 2 pills (=1000mg ) by mouth at 1pm, 3pm, and 10pm the day before your colorectal operation   500 mg Oral 3 times daily 06/03/24 0935 06/03/24 1555   05/29/24 1500  DAPTOmycin  (CUBICIN ) IVPB 500 mg/50mL premix  Status:  Discontinued        8 mg/kg  60.9 kg 100 mL/hr over 30 Minutes Intravenous Daily 05/29/24 1359 06/04/24 0738   05/28/24 2100  vancomycin  (VANCOREADY) IVPB 750 mg/150 mL  Status:  Discontinued        750 mg 150 mL/hr over 60 Minutes Intravenous Every 12 hours 05/28/24 1419 05/29/24 1359   05/27/24 0800  vancomycin  (VANCOCIN ) IVPB 1000 mg/200 mL premix  Status:  Discontinued        1,000 mg 200 mL/hr over 60 Minutes Intravenous Every 24 hours 05/26/24 1144 05/28/24 1419   05/23/24 1400  piperacillin -tazobactam (ZOSYN ) IVPB 3.375 g  Status:  Discontinued        3.375 g 12.5  mL/hr over 240 Minutes Intravenous Every 8 hours 05/23/24 0804 06/04/24 0738   05/23/24 1200  micafungin  (MYCAMINE ) 100 mg in sodium chloride  0.9 % 100 mL IVPB        100 mg 105 mL/hr over 1 Hours Intravenous Every 24 hours 05/23/24 0755 06/05/24 1221   05/22/24 0800  vancomycin  (VANCOCIN ) IVPB 1000 mg/200 mL premix  Status:  Discontinued        1,000 mg 200 mL/hr over 60 Minutes Intravenous Every 24 hours 05/21/24 0634 05/26/24 0806   05/21/24 0730  vancomycin  (VANCOREADY) IVPB 1250 mg/250 mL        1,250 mg 166.7 mL/hr over 90 Minutes Intravenous  Once 05/21/24 0634 05/21/24 1043   05/19/24 1400  piperacillin -tazobactam (ZOSYN ) IVPB 3.375 g  Status:  Discontinued        3.375 g 12.5 mL/hr over 240 Minutes Intravenous Every 8 hours  05/19/24 1303 05/23/24 0804   05/09/24 0900  erythromycin  250 mg in sodium chloride  0.9 % 100 mL IVPB        250 mg 100 mL/hr over 60 Minutes Intravenous Every 8 hours 05/09/24 0732 05/11/24 0044   05/08/24 2200  cefoTEtan  (CEFOTAN ) 2 g in sodium chloride  0.9 % 100 mL IVPB        2 g 200 mL/hr over 30 Minutes Intravenous Every 12 hours 05/08/24 1759 05/09/24 0749   05/08/24 1400  neomycin  (MYCIFRADIN ) tablet 1,000 mg  Status:  Discontinued       Placed in And Linked Group   1,000 mg Oral 3 times per day 05/08/24 1119 05/08/24 1120   05/08/24 1400  metroNIDAZOLE  (FLAGYL ) tablet 1,000 mg  Status:  Discontinued       Placed in And Linked Group   1,000 mg Oral 3 times per day 05/08/24 1119 05/08/24 1120   05/08/24 1130  cefoTEtan  (CEFOTAN ) 2 g in sodium chloride  0.9 % 100 mL IVPB        2 g 200 mL/hr over 30 Minutes Intravenous On call to O.R. 05/08/24 1119 05/09/24 0749       Assessment/Plan: 83 year old woman status post robotic LAR with takedown of end colostomy and anastomosis, small bowel resection and small bowel repair along with 2 hours of adhesiolysis on 05/08/2024  Complicated by anastomotic leak necessitating drain placement 6/10 and 6/18 and ultimately diverting colostomy + G-tube placement 6/24  Failure to thrive, severe malnutrition, intraperitoneal abscess -Continue TPN -As colostomy is functioning, G tube was clamped this morning. Ok to vent as needed for nausea or distension. Ok for sips. SLP to follow up again with patient this week. Continue broad-spectrum antibiotics On heparin  for A-fib; hemoglobin is stable Mobilize as tolerated     LOS: 31 days    Elizabeth Odom Dawn 06/08/2024

## 2024-06-08 NOTE — Progress Notes (Signed)
 Patient draining green liquid from left upper gluteal area (where drain was previously)- drainage appears to be the same as the green stool coming from her rectum. Area around previous drain is red, swollen, firm to touch. Dry gauze placed over site. MD Pokhrel made aware- Will pass to next shift RN to alert surgical team 06/09/24. WOC also re-consulted.

## 2024-06-08 NOTE — Progress Notes (Signed)
 PROGRESS NOTE  Elizabeth Odom FMW:[id number] DOB: 1941-01-17 DOA: 05/08/2024 PCP: Jesus Bernardino MATSU, MD   LOS: 31 days   Brief narrative:  Elizabeth Odom is a 83 y.o. female with a history of atrial fibrillation, DVT, PE, anemia, PACs, hypertension, mild cognitive impairment, history of large bowel obstruction status post colonic resection and colostomy creation last year.  Her clinical course was complicated by anastomotic leak in presented last month for a colostomy resection and takedown but had complicated postop course with dense adhesions.  Patient also had atrial fibrillation with RVR polypharmacy and altered mental status.  At this time patient has been in the hospital for intra-abdominal abscess with polymicrobial growth and has been having poor oral intake and on TPN.  Patient was taken back to the OR on 625 for lysis of adhesions colostomy placement and drainage of abscess.  Rectal tube was placed thin for drainage.  Patient also had G-tube placed in for nutrition.  Intraoperatively patient had hypotension requiring vasopressors but this resolved and patient has been extubated as well.  At this time, patient has been considered for transfer out of the ICU  Sequence of events. 5/29 presented for elective colostomy takedown per Dr. Sheldon  6/10 Transgluteal drain placed per IR 6/17 CT guided drain placed into the abdominal and pelvic abscesses yielding pus and stool.   6/24 Back to the OR with Dr. Sheldon for lysis of adhesions, colostomy placement, drainage of abscess, and placement of G-tube, Intraoperatively she had episode of hypotension requiring vasopressors immediately upon insufflation to the abdomen. 6/25 extubated.  Assessment/Plan: Principal Problem:   Diverticular disease Active Problems:   A-fib (HCC)   Restless leg   ABLA (acute blood loss anemia)   Need for emotional support   Acute urinary retention   Mixed hyperlipidemia   Essential hypertension   Diverticular  disease of left colon   History of DVT (deep vein thrombosis)   History of pulmonary embolus (PE)   Pre-diabetes   Presence of IVC filter   Stricture of sigmoid colon (HCC)   Protein-calorie malnutrition, severe   Hypokalemia   Complex surgical abdomen POD 1 ex-lap for lysis of adhesions Diverting Transverse loop colostomy G tube placement Rectal tube placement (for abscess drainage) Continue TPN wound VAC and management as per surgery.  On G-tube with intermittent wall suction.  Patient is alert awake but complains of a lot of pain today.  Functioning colostomy at this time.  NG tube has been clamped.  Intra-abdominal abscess Infectious disease on board.  Currently on  daptomycin , Zosyn , and micafungin .  Follow ID recommendations.  Plan for repeat CT scan as per ID and general surgery.   Atrial Fibrillation On IV Lopressor  and heparin  drip.  Overall rate controlled.  Hypokalemia.  Will replace with IV KCl.  Check levels in AM.   Severe protein calorie malnutrition Nutrition Status:Body mass index is 23.92 kg/m.  Nutrition Problem: Severe Malnutrition Etiology: chronic illness Signs/Symptoms: severe fat depletion, severe muscle depletion Interventions: Refer to RD note for recommendations, TPN.  Patient underwent gastrostomy tube placement.  Debility deconditioning patient frailty.  Continue PT.  PT has recommended skilled nursing facility placement.  Overall prognosis of the patient appears to be guarded.  Will consult palliative care for goals of care, symptom management..  DVT prophylaxis: SCD's Start: 05/08/24 1759   Disposition: As per PT, skilled nursing facility placement, likely in several days.  Status is: Inpatient  Remains inpatient appropriate because: Pending clinical improvement,   Code  Status:     Code Status: Full Code  Family Communication:  Spoke with the patient's granddaughter on the phone on 06/06/2024   Consultants: General  Surgery PCCM Infectious disease Palliative care.  Procedures: 5/29 elective colostomy takedown per Dr. Sheldon  6/10 Transgluteal drain placed per IR 6/17 CT guided drain placed into the abdominal and pelvic abscesses yielding pus and stool.   6/24 Lysis of adhesions, colostomy placement, drainage of abscess, and placement of G-tube,  6/25 extubated.  Anti-infectives:  Zosyn  IV Daptomycin  IV Micafungin  IV  Anti-infectives (From admission, onward)    Start     Dose/Rate Route Frequency Ordered Stop   06/06/24 1000  micafungin  (MYCAMINE ) 100 mg in sodium chloride  0.9 % 100 mL IVPB        100 mg 105 mL/hr over 1 Hours Intravenous Every 24 hours 06/05/24 1349 06/20/24 0959   06/04/24 1400  piperacillin -tazobactam (ZOSYN ) IVPB 3.375 g        3.375 g 12.5 mL/hr over 240 Minutes Intravenous Every 8 hours 06/04/24 0738 06/09/24 1359   06/04/24 1400  DAPTOmycin  (CUBICIN ) IVPB 500 mg/53mL premix        8 mg/kg  60.9 kg 100 mL/hr over 30 Minutes Intravenous Daily 06/04/24 0738 06/09/24 1959   06/03/24 1000  neomycin  (MYCIFRADIN ) tablet 500 mg  Status:  Discontinued        500 mg Oral 3 times daily 06/03/24 0935 06/03/24 1555   06/03/24 1000  metroNIDAZOLE  (FLAGYL ) tablet 500 mg  Status:  Discontinued       Note to Pharmacy: Take 2 pills (=1000mg ) by mouth at 1pm, 3pm, and 10pm the day before your colorectal operation   500 mg Oral 3 times daily 06/03/24 0935 06/03/24 1555   05/29/24 1500  DAPTOmycin  (CUBICIN ) IVPB 500 mg/50mL premix  Status:  Discontinued        8 mg/kg  60.9 kg 100 mL/hr over 30 Minutes Intravenous Daily 05/29/24 1359 06/04/24 0738   05/28/24 2100  vancomycin  (VANCOREADY) IVPB 750 mg/150 mL  Status:  Discontinued        750 mg 150 mL/hr over 60 Minutes Intravenous Every 12 hours 05/28/24 1419 05/29/24 1359   05/27/24 0800  vancomycin  (VANCOCIN ) IVPB 1000 mg/200 mL premix  Status:  Discontinued        1,000 mg 200 mL/hr over 60 Minutes Intravenous Every 24 hours  05/26/24 1144 05/28/24 1419   05/23/24 1400  piperacillin -tazobactam (ZOSYN ) IVPB 3.375 g  Status:  Discontinued        3.375 g 12.5 mL/hr over 240 Minutes Intravenous Every 8 hours 05/23/24 0804 06/04/24 0738   05/23/24 1200  micafungin  (MYCAMINE ) 100 mg in sodium chloride  0.9 % 100 mL IVPB        100 mg 105 mL/hr over 1 Hours Intravenous Every 24 hours 05/23/24 0755 06/05/24 1221   05/22/24 0800  vancomycin  (VANCOCIN ) IVPB 1000 mg/200 mL premix  Status:  Discontinued        1,000 mg 200 mL/hr over 60 Minutes Intravenous Every 24 hours 05/21/24 0634 05/26/24 0806   05/21/24 0730  vancomycin  (VANCOREADY) IVPB 1250 mg/250 mL        1,250 mg 166.7 mL/hr over 90 Minutes Intravenous  Once 05/21/24 0634 05/21/24 1043   05/19/24 1400  piperacillin -tazobactam (ZOSYN ) IVPB 3.375 g  Status:  Discontinued        3.375 g 12.5 mL/hr over 240 Minutes Intravenous Every 8 hours 05/19/24 1303 05/23/24 0804   05/09/24 0900  erythromycin  250  mg in sodium chloride  0.9 % 100 mL IVPB        250 mg 100 mL/hr over 60 Minutes Intravenous Every 8 hours 05/09/24 0732 05/11/24 0044   05/08/24 2200  cefoTEtan  (CEFOTAN ) 2 g in sodium chloride  0.9 % 100 mL IVPB        2 g 200 mL/hr over 30 Minutes Intravenous Every 12 hours 05/08/24 1759 05/09/24 0749   05/08/24 1400  neomycin  (MYCIFRADIN ) tablet 1,000 mg  Status:  Discontinued       Placed in And Linked Group   1,000 mg Oral 3 times per day 05/08/24 1119 05/08/24 1120   05/08/24 1400  metroNIDAZOLE  (FLAGYL ) tablet 1,000 mg  Status:  Discontinued       Placed in And Linked Group   1,000 mg Oral 3 times per day 05/08/24 1119 05/08/24 1120   05/08/24 1130  cefoTEtan  (CEFOTAN ) 2 g in sodium chloride  0.9 % 100 mL IVPB        2 g 200 mL/hr over 30 Minutes Intravenous On call to O.R. 05/08/24 1119 05/09/24 0749      Subjective:  Today, patient was seen and examined at bedside.  Patient complains of mild abdominal pain.  Denies any nausea vomiting fever or  chills.  More alert awake and Communicative.  Objective: Vitals:   06/07/24 1942 06/08/24 0615  BP: (!) 144/96 133/89  Pulse: 81 93  Resp: 20 (!) 22  Temp: 97.6 F (36.4 C) 97.9 F (36.6 C)  SpO2: 100% 100%    Intake/Output Summary (Last 24 hours) at 06/08/2024 1153 Last data filed at 06/08/2024 0610 Gross per 24 hour  Intake 1803.23 ml  Output 1505 ml  Net 298.23 ml   Filed Weights   06/06/24 0500 06/07/24 0500 06/08/24 0442  Weight: 65.2 kg 62.3 kg 64.8 kg   Body mass index is 23.77 kg/m.   Physical Exam: General: Alert awake and Communicative not chronically ill and deconditioned, on nasal cannula oxygen,  HENT:   No scleral pallor or icterus noted. Oral mucosa is moist.  Chest: Diminished breath sounds bilaterally. CVS: S1 &S2 heard. No murmur.  Regular rate and rhythm. Abdomen: Tenderness noted with gastrostomy tube in place, abdominal drain present, urethral catheter in place.  Colostomy right upper quadrant functioning. Extremities: Right upper extremity PICC line in place. Psych: Alert, awake and oriented, normal mood CNS:  No cranial nerve deficits.  Generalized weakness noted Skin: Warm and dry.  Abdominal incision.   Data Review: I have personally reviewed the following laboratory data and studies,  CBC: Recent Labs  Lab 06/04/24 1546 06/05/24 0429 06/06/24 0346 06/07/24 0423 06/08/24 0406  WBC 12.4* 13.5* 11.7* 13.1* 10.3  HGB 7.2* 7.1* 8.0* 8.6* 8.0*  HCT 23.8* 23.3* 25.8* 27.6* 26.0*  MCV 89.8 95.5 90.5 89.9 92.5  PLT 370 361 353 380 319   Basic Metabolic Panel: Recent Labs  Lab 06/02/24 0255 06/04/24 0609 06/05/24 0830 06/06/24 0346 06/07/24 0423 06/08/24 0406  NA 132* 132* 138 135 133* 133*  K 3.9 3.9 4.0 3.8 3.8 3.4*  CL 101 101 107 104 101 104  CO2 23 22 22 22  21* 22  GLUCOSE 105* 151* 140* 121* 133* 124*  BUN 18 23 21 21 19 23   CREATININE 0.71 0.77 0.55 0.61 0.47 0.43*  CALCIUM  8.1* 7.7* 8.0* 8.0* 7.8* 7.6*  MG 1.8 1.8 1.9 1.9  2.0  --   PHOS 3.3 2.8 1.9* 2.2* 2.5  --    Liver Function Tests: Recent  Labs  Lab 06/02/24 0255 06/04/24 0609 06/05/24 0830  AST 25 34 26  ALT 26 23 21   ALKPHOS 183* 129* 146*  BILITOT 0.9 1.2 1.3*  PROT 7.1 6.2* 6.4*  ALBUMIN  1.6* 1.7* 1.7*   No results for input(s): LIPASE, AMYLASE in the last 168 hours. No results for input(s): AMMONIA in the last 168 hours. Cardiac Enzymes: Recent Labs  Lab 06/07/24 0423  CKTOTAL 19*    BNP (last 3 results) No results for input(s): BNP in the last 8760 hours.  ProBNP (last 3 results) No results for input(s): PROBNP in the last 8760 hours.  CBG: Recent Labs  Lab 06/06/24 2358 06/07/24 0529 06/07/24 1307 06/07/24 2358 06/08/24 1105  GLUCAP 129* 136* 152* 126* 129*   Recent Results (from the past 240 hours)  Surgical pcr screen     Status: Abnormal   Collection Time: 06/02/24  7:55 PM   Specimen: Nasal Mucosa; Nasal Swab  Result Value Ref Range Status   MRSA, PCR POSITIVE (A) NEGATIVE Final   Staphylococcus aureus POSITIVE (A) NEGATIVE Final    Comment: (NOTE) The Xpert SA Assay (FDA approved for NASAL specimens in patients 84 years of age and older), is one component of a comprehensive surveillance program. It is not intended to diagnose infection nor to guide or monitor treatment. Performed at [facility], [address], [place], [place] [address]      Studies: No results found.     Vernal Alstrom, MD  [facility] 06/08/2024  If 7PM-7AM, please contact night-coverage

## 2024-06-08 NOTE — Progress Notes (Signed)
 Patient concerned that she lost her cell phone here at hospital. Granddaughter, Almarie, called. Stated patient lost her cell phone on 05/31/24 on floor 3E. States RN/MD was made aware, and paperwork was filled out at that time.

## 2024-06-08 NOTE — Consult Note (Signed)
 WOC consulted for drainage from previous IR drain from the left buttock (drain placed 6/10), not clear when this was removed. [place] to nurse to determine amount of drainage, new onset, stool looking in appearance per nursing.  Dr. Dasie included in the [facility]. Suggested foam for now and have surgery evaluate in the am.   Jalaiyah Throgmorton [facility], CNS, [employer] [phone]

## 2024-06-09 DIAGNOSIS — K5790 Diverticulosis of intestine, part unspecified, without perforation or abscess without bleeding: Secondary | ICD-10-CM | POA: Diagnosis not present

## 2024-06-09 LAB — CBC
HCT: 24.4 % — ABNORMAL LOW (ref 36.0–46.0)
Hemoglobin: 7.4 g/dL — ABNORMAL LOW (ref 12.0–15.0)
MCH: 27.9 pg (ref 26.0–34.0)
MCHC: 30.3 g/dL (ref 30.0–36.0)
MCV: 92.1 fL (ref 80.0–100.0)
Platelets: 351 10*3/uL (ref 150–400)
RBC: 2.65 MIL/uL — ABNORMAL LOW (ref 3.87–5.11)
RDW: 17.9 % — ABNORMAL HIGH (ref 11.5–15.5)
WBC: 10.8 10*3/uL — ABNORMAL HIGH (ref 4.0–10.5)
nRBC: 0.3 % — ABNORMAL HIGH (ref 0.0–0.2)

## 2024-06-09 LAB — GLUCOSE, CAPILLARY
Glucose-Capillary: 109 mg/dL — ABNORMAL HIGH (ref 70–99)
Glucose-Capillary: 121 mg/dL — ABNORMAL HIGH (ref 70–99)
Glucose-Capillary: 134 mg/dL — ABNORMAL HIGH (ref 70–99)
Glucose-Capillary: 146 mg/dL — ABNORMAL HIGH (ref 70–99)
Glucose-Capillary: 162 mg/dL — ABNORMAL HIGH (ref 70–99)

## 2024-06-09 LAB — HEPARIN LEVEL (UNFRACTIONATED): Heparin Unfractionated: 0.36 [IU]/mL (ref 0.30–0.70)

## 2024-06-09 LAB — COMPREHENSIVE METABOLIC PANEL WITH GFR
ALT: 21 U/L (ref 0–44)
AST: 22 U/L (ref 15–41)
Albumin: 1.5 g/dL — ABNORMAL LOW (ref 3.5–5.0)
Alkaline Phosphatase: 196 U/L — ABNORMAL HIGH (ref 38–126)
Anion gap: 7 (ref 5–15)
BUN: 23 mg/dL (ref 8–23)
CO2: 22 mmol/L (ref 22–32)
Calcium: 7.7 mg/dL — ABNORMAL LOW (ref 8.9–10.3)
Chloride: 105 mmol/L (ref 98–111)
Creatinine, Ser: 0.46 mg/dL (ref 0.44–1.00)
GFR, Estimated: >60 mL/min (ref >60)
Glucose, Bld: 128 mg/dL — ABNORMAL HIGH (ref 70–99)
Potassium: 3.9 mmol/L (ref 3.5–5.1)
Sodium: 134 mmol/L — ABNORMAL LOW (ref 135–145)
Total Bilirubin: 1.4 mg/dL — ABNORMAL HIGH (ref 0.0–1.2)
Total Protein: 6.1 g/dL — ABNORMAL LOW (ref 6.5–8.1)

## 2024-06-09 LAB — PHOSPHORUS: Phosphorus: 2.7 mg/dL (ref 2.5–4.6)

## 2024-06-09 LAB — PREALBUMIN: Prealbumin: 13 mg/dL — ABNORMAL LOW (ref 18–38)

## 2024-06-09 LAB — TRIGLYCERIDES: Triglycerides: 205 mg/dL — ABNORMAL HIGH (ref <150)

## 2024-06-09 LAB — MAGNESIUM: Magnesium: 1.8 mg/dL (ref 1.7–2.4)

## 2024-06-09 MED ORDER — CALCIUM POLYCARBOPHIL 625 MG PO TABS
625.0000 mg | ORAL_TABLET | Freq: Two times a day (BID) | ORAL | Status: DC
  Administered 2024-06-09 – 2024-06-16 (×14): 625 mg
  Filled 2024-06-09 (×14): qty 1

## 2024-06-09 MED ORDER — METOPROLOL TARTRATE 12.5 MG HALF TABLET
12.5000 mg | ORAL_TABLET | Freq: Two times a day (BID) | ORAL | Status: DC
  Administered 2024-06-09 – 2024-06-19 (×18): 12.5 mg
  Filled 2024-06-09 (×20): qty 1

## 2024-06-09 MED ORDER — FUROSEMIDE 10 MG/ML IJ SOLN
40.0000 mg | Freq: Once | INTRAMUSCULAR | Status: AC
  Administered 2024-06-09: 40 mg via INTRAVENOUS
  Filled 2024-06-09: qty 4

## 2024-06-09 MED ORDER — POTASSIUM CHLORIDE CRYS ER 20 MEQ PO TBCR
40.0000 meq | EXTENDED_RELEASE_TABLET | Freq: Every day | ORAL | Status: DC
  Administered 2024-06-09 – 2024-06-12 (×3): 40 meq via ORAL
  Filled 2024-06-09 (×3): qty 2

## 2024-06-09 MED ORDER — IOHEXOL 9 MG/ML PO SOLN
1000.0000 mL | ORAL | Status: AC
  Administered 2024-06-09: 1000 mL via ORAL

## 2024-06-09 MED ORDER — FERROUS SULFATE 325 (65 FE) MG PO TABS
325.0000 mg | ORAL_TABLET | Freq: Two times a day (BID) | ORAL | Status: DC
  Administered 2024-06-09 – 2024-06-13 (×7): 325 mg via ORAL
  Filled 2024-06-09 (×7): qty 1

## 2024-06-09 MED ORDER — VITAMIN C 500 MG PO TABS
500.0000 mg | ORAL_TABLET | Freq: Two times a day (BID) | ORAL | Status: DC
  Administered 2024-06-09: 500 mg via ORAL
  Filled 2024-06-09: qty 1

## 2024-06-09 MED ORDER — TRAVASOL 10 % IV SOLN
INTRAVENOUS | Status: AC
  Filled 2024-06-09: qty 940.8

## 2024-06-09 MED ORDER — PANTOPRAZOLE SODIUM 40 MG PO TBEC
40.0000 mg | DELAYED_RELEASE_TABLET | Freq: Every day | ORAL | Status: DC
  Administered 2024-06-10 – 2024-06-15 (×5): 40 mg via ORAL
  Filled 2024-06-09 (×5): qty 1

## 2024-06-09 MED ORDER — DAPTOMYCIN-SODIUM CHLORIDE 500-0.9 MG/50ML-% IV SOLN
500.0000 mg | Freq: Every day | INTRAVENOUS | Status: DC
  Administered 2024-06-09 – 2024-06-13 (×5): 500 mg via INTRAVENOUS
  Filled 2024-06-09 (×5): qty 50

## 2024-06-09 MED ORDER — OSMOLITE 1.5 CAL PO LIQD
1000.0000 mL | ORAL | Status: DC
  Administered 2024-06-09: 1000 mL
  Filled 2024-06-09 (×2): qty 1000

## 2024-06-09 MED ORDER — METOPROLOL TARTRATE 25 MG PO TABS
12.5000 mg | ORAL_TABLET | Freq: Two times a day (BID) | ORAL | Status: DC
  Administered 2024-06-09: 12.5 mg via ORAL
  Filled 2024-06-09: qty 1

## 2024-06-09 MED ORDER — VITAMIN C 500 MG PO TABS
500.0000 mg | ORAL_TABLET | Freq: Every day | ORAL | Status: DC
  Administered 2024-06-10 – 2024-06-20 (×10): 500 mg
  Filled 2024-06-09 (×10): qty 1

## 2024-06-09 MED ORDER — IOHEXOL 9 MG/ML PO SOLN
ORAL | Status: AC
Start: 2024-06-09 — End: 2024-06-09
  Filled 2024-06-09: qty 1000

## 2024-06-09 MED ORDER — CALCIUM POLYCARBOPHIL 625 MG PO TABS: 625.0000 mg | ORAL_TABLET | Freq: Two times a day (BID) | ORAL | Status: DC

## 2024-06-09 MED ORDER — OSMOLITE 1.2 CAL PO LIQD
1000.0000 mL | ORAL | Status: DC
  Filled 2024-06-09: qty 1000

## 2024-06-09 NOTE — Progress Notes (Signed)
 Speech Language Pathology Treatment: Dysphagia  Patient Details Name: Elizabeth Odom MRN: [id number] DOB: Jul 12, 1941 Today's Date: 06/09/2024 Time: 1035-1050 SLP Time Calculation (min) (ACUTE ONLY): 15 min  Assessment / Plan / Recommendation Clinical Impression  Patient seen by SLP for skilled treatment focused on dysphagia goals. Surgery (Dr. Sheldon) advanced her to PO diet today of Dys 1 (puree) solids, thin liquids. When SLP entered room, patient awake, alert and was agreeable to some PO's, requesting a mango italian ice. She did request it be mashed up in a cup so she could eat it with a spoon. SLP assisted with raising HOB but patient declining to have it raised completely for 90 degrees elevation. She was able to feed herself spoon bites of italian ice with no overt s/s aspiration with patient pausing after each bite and exhibiting a mild increase in her WOB. She did not c/o any pain or difficulty with swallowing. At one point she c/o feeling something in the left side of her throat and shortly afterwards she expectorated thick light yellowish phlegm. SLP inspected oral cavity and did not see any other significant amount of phlegm/secretions. SLP recommending to continue on current PO diet of dys 1 (puree) solids, thin liquids and will follow for toleration and ability to advance.   HPI HPI: 83 y.o. female s/p rectosigmoid resection takedown on 5/29. Course complicated by A-fib with RVR and then hypotension/rectal bleeding and syncope on 05/11/2024.  Patient  had postop ileus needing NG tube and TPN.  Imaging on 05/19/2024 showed possible abscesses. S/P IR guided drains placed on 05/20/2024.  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  Swallow eval ordered. Pt is s/p PEG placement.   She required intubation for surgery 6/24 and was extubated 6/25.  Chest imaging showed Enteric tube with side hole just below the GE junction, recommend  advancement.     Mild prominence of the central pulmonary vasculature without overt  pulmonary edema.      SLP Plan  Continue with current plan of care          Recommendations  Diet recommendations: Dysphagia 1 (puree);Thin liquid Medication Administration: Other (Comment) (as tolerated) Supervision: Patient able to self feed Compensations: Slow rate;Small sips/bites Postural Changes and/or Swallow Maneuvers: Seated upright 90 degrees                  Oral care BID;Staff/trained caregiver to provide oral care;Oral care before and after PO   Intermittent Supervision/Assistance Dysphagia, unspecified (R13.10)     Continue with current plan of care    Elizabeth IVAR Blase, MA, CCC-SLP Speech Therapy

## 2024-06-09 NOTE — Progress Notes (Signed)
 PHARMACY - ANTICOAGULATION CONSULT NOTE  Pharmacy Consult for heparin  Indication: atrial fibrillation (apixaban  on hold)  Allergies  Allergen Reactions   Elemental Sulfur Anaphylaxis    Patient Measurements: Height: 5' 5 (165.1 cm) Weight: 66.9 kg (147 lb 7.8 oz) IBW/kg (Calculated) : 57 HEPARIN  DW (KG): 61.3  Vital Signs: Temp: 98.6 F (37 C) (06/30 0418) Temp Source: Oral (06/30 0418) BP: 150/75 (06/30 0418) Pulse Rate: 96 (06/30 0418)  Labs: Recent Labs    06/07/24 0423 06/08/24 0406 06/09/24 0406  HGB 8.6* 8.0* 7.4*  HCT 27.6* 26.0* 24.4*  PLT 380 319 351  HEPARINUNFRC 0.30 0.39 0.36  CREATININE 0.47 0.43* 0.46  CKTOTAL 19*  --   --     Estimated Creatinine Clearance: 47.9 mL/min (by C-G formula based on SCr of 0.46 mg/dL).  Medications: Pt was not on anticoagulants PTA, however, apixaban  was started during admission for atrial fibrillation.  -Apixaban  2.5 mg PO BID, last dose given 6/23 @ 21:30  Assessment: Pt is an 35 yoF who was started on apixaban  during this admission for atrial fibrillation. Pt underwent surgery on 6/24 - placement of G-tube, I&D of abscess, diverting colostomy. CCS recommending heparin  drip post-op until pt having BM.   Pharmacy consulted to dose heparin  for afib while apixaban  on hold.   06/09/2024 -Heparin  level 0.36 - remains therapeutic on 1550 units/hr -Hg 7.4 - trending down -G tube previously to suction, clamped 6/29  Goal of Therapy:  Heparin  level 0.3-0.7 units/ml aPTT 66-102 seconds Monitor platelets by anticoagulation protocol: Yes   Plan:  -Continue heparin  infusion at 1550 units/hr -CBC, heparin  level daily -Follow for ability to resume apixaban    Elizabeth Odom, PharmD, BCPS Clinical Pharmacist 06/09/2024 7:34 AM

## 2024-06-09 NOTE — Progress Notes (Signed)
 PHARMACY - TOTAL PARENTERAL NUTRITION CONSULT NOTE   Indication: Prolonged ileus  Patient Measurements: Height: 5' 5 (165.1 cm) Weight: 66.9 kg (147 lb 7.8 oz) IBW/kg (Calculated) : 57 TPN AdjBW (KG): 61.3 Body mass index is 24.54 kg/m.  Assessment: 48 yoF with history of diverticulitis with perforation and abscess. 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 She requested ostomy takedown and underwent LOA, colostomy takedown, small bowel resection with anastomosis on 5/29. Pharmacy is consulted to dose TPN starting 6/2 for postop ileus.  Post-op course complicated by anastomotic leak, multiple-intraabdominal abscesses. Patient is now s/p diverting loop transverse colostomy.  Glucose / Insulin : no Hx DM, A1c 5.1%. BG goal <180 -SSI discontinued on 6/11 -CBGs q4h < 150 Electrolytes: Na 134 (slightly low, stable), all others WNL Hepatic: albumin  low, Alk Phos slightley elevated -TG 174 (6/28) > 205 (6/30) I/O: -UOP: 1400 mL recorded this morning, lasix  IV x 1 -Stool: 150 ml -Drain: minimal GI Imaging:  -6/9 CT: Dempsey dehiscence along the rectal anastomosis, free air and multiple fluid collections -6/16 CT: Similar appearance at the rectal anastomosis concerning for dehiscence -6/23: poss catheter occlusion of PERC drain, several fistulous of sigmoid colon, gas collection, rectal anastomosis with dehiscence cavity communicating to small bowel, Multiple additional small loculated fluid collection at new percutaneous drainage catheter site. Additional small loculated fluid collection small bowel mesentery.  GI Surgeries / Procedures: -5/29: LAR, SBR, colostomy takedown, LOA -6/10 drains placed x 2 in IR (midline abd, transgluteal) -6/17: Replacement of drainage catheter by IR -6/24: diverting loop transverse colostomy, I&D of abdominal abscess, gastrotomy tube  placement, rectal tube placement.   Central access: Double lumen PICC placed 6/2 TPN start date: 6/2  Nutritional Goals: Goal TPN rate is 70 mL/hr (provides 94 g of protein and 1680 kcals per day)  RD Assessment:  Estimated Needs Total Energy Estimated Needs: 1600-1750 kcals Total Protein Estimated Needs: 80-95 grams Total Fluid Estimated Needs: >/= 1.6L  Current Nutrition:  TPN at goal rate 70 ml/hr  Diet: dysphagia 1 Multiple calorie counts this admission. Not meeting caloric needs. Refusing all supplements. Pt underwent G-tube placement 6/24. Tube feeds started 6/26, stopped for abdominal distention and discomfort. G tube to suction, has been clamped. 6/30 Tube feeds started this morning  Plan:  Potassium 40 mEq PO daily x 5 days per CCS, lasix  40 mg IV x 1 today (expect she may need additional diuresis) Tube feeds started this morning - if able to tolerate advancement to goal, hopeful to stop TPN next 24-48 hours Continue TPN at goal rate of 70 mL/hr Electrolytes in TPN: Na 150 mEq/L K 40 mEq/L Ca 5 mEq/L Mg 5 mEq/L Phos 15 mmol/L Cl:Ac 1:1 Continue MVI in TPN Monitor TPN labs on Mon/Thurs, and as needed.  [name], PharmD, BCPS Clinical Pharmacist 06/09/2024 9:17 AM

## 2024-06-09 NOTE — Progress Notes (Signed)
 PROGRESS NOTE  UNKNOWN FLANNIGAN FMW:[id number] DOB: 05-27-1941 DOA: 05/08/2024 PCP: Jesus Bernardino MATSU, MD   LOS: 32 days   Brief narrative:  Elizabeth Odom is a 83 y.o. female with a history of atrial fibrillation, DVT, PE, anemia, PACs, hypertension, mild cognitive impairment, history of large bowel obstruction status post colonic resection and colostomy creation last year.  Her clinical course was complicated by anastomotic leak in presented last month for a colostomy resection and takedown but had complicated postop course with dense adhesions.  Patient also had atrial fibrillation with RVR polypharmacy and altered mental status.  At this time patient has been in the hospital for intra-abdominal abscess with polymicrobial growth and has been having poor oral intake and on TPN.  Patient was taken back to the OR on 625 for lysis of adhesions colostomy placement and drainage of abscess.  Rectal tube was placed thin for drainage.  Patient also had G-tube placed in for nutrition.  Intraoperatively, patient had hypotension requiring vasopressors but this resolved and patient has been extubated as well.  At this time, patient has been considered for transfer out of the ICU  Sequence of events. 5/29 presented for elective colostomy takedown per Dr. Sheldon  6/10 Transgluteal drain placed per IR 6/17 CT guided drain placed into the abdominal and pelvic abscesses yielding pus and stool.   6/24 Back to the OR with Dr. Sheldon for lysis of adhesions, colostomy placement, drainage of abscess, and placement of G-tube, Intraoperatively she had episode of hypotension requiring vasopressors immediately upon insufflation to the abdomen. 6/25 extubated.  Assessment/Plan: Principal Problem:   Diverticular disease Active Problems:   A-fib (HCC)   Restless leg   ABLA (acute blood loss anemia)   Need for emotional support   Acute urinary retention   Mixed hyperlipidemia   Essential hypertension   Diverticular  disease of left colon   History of DVT (deep vein thrombosis)   History of pulmonary embolus (PE)   Pre-diabetes   Presence of IVC filter   Stricture of sigmoid colon (HCC)   Protein-calorie malnutrition, severe   Hypokalemia   Complex surgical abdomen POD 1 ex-lap for lysis of adhesions Diverting Transverse loop colostomy G tube placement Rectal tube placement (for abscess drainage) Continue TPN wound VAC and management as per surgery.  On G-tube feeding.   Functioning colostomy at this time.  Has been started on oral diet as well.  Complains of significant pain.  Intra-abdominal abscess Infectious disease on board.  Currently on  daptomycin , Zosyn , and micafungin .  CT scan of the abdomen as per general surgery  Atrial Fibrillation On metoprolol  and heparin  drip.  Rate controlled.  Hypokalemia.  Proved after replacement.  Latest potassium 3 point   Severe protein calorie malnutrition Nutrition Status:Body mass index is 23.92 kg/m.  Nutrition Problem: Severe Malnutrition Etiology: chronic illness Signs/Symptoms: severe fat depletion, severe muscle depletion Interventions: Refer to RD note for recommendations, currently on TPN, tube feeds and oral diet.    Debility deconditioning, frailty.    PT has recommended skilled nursing facility placement.    Goals of care.  Patient undergoing severe pain and deconditioning.  Palliative care on board regarding ongoing goals of care and symptom management  DVT prophylaxis: SCD's Start: 05/08/24 1759   Disposition: Skilled nursing facility when okay with general surgery.  Status is: Inpatient  Remains inpatient appropriate because: Pending clinical improvement, need for vascular thin facility placement   Code Status:     Code Status: Full Code  Family Communication:  Spoke with the patient's granddaughter on the phone on 06/06/2024   Consultants: General Surgery PCCM Infectious disease Palliative care.  Procedures: 5/29  elective colostomy takedown per Dr. Sheldon  6/10 Transgluteal drain placed per IR 6/17 CT guided drain placed into the abdominal and pelvic abscesses yielding pus and stool.   6/24 Lysis of adhesions, colostomy placement, drainage of abscess, and placement of G-tube,  6/25 extubated.  Anti-infectives:  Zosyn  IV Daptomycin  IV Micafungin  IV  Anti-infectives (From admission, onward)    Start     Dose/Rate Route Frequency Ordered Stop   06/06/24 1000  micafungin  (MYCAMINE ) 100 mg in sodium chloride  0.9 % 100 mL IVPB        100 mg 105 mL/hr over 1 Hours Intravenous Every 24 hours 06/05/24 1349 06/20/24 0959   06/04/24 1400  piperacillin -tazobactam (ZOSYN ) IVPB 3.375 g        3.375 g 12.5 mL/hr over 240 Minutes Intravenous Every 8 hours 06/04/24 0738 06/09/24 0947   06/04/24 1400  DAPTOmycin  (CUBICIN ) IVPB 500 mg/60mL premix        8 mg/kg  60.9 kg 100 mL/hr over 30 Minutes Intravenous Daily 06/04/24 0738 06/08/24 2020   06/03/24 1000  neomycin  (MYCIFRADIN ) tablet 500 mg  Status:  Discontinued        500 mg Oral 3 times daily 06/03/24 0935 06/03/24 1555   06/03/24 1000  metroNIDAZOLE  (FLAGYL ) tablet 500 mg  Status:  Discontinued       Note to Pharmacy: Take 2 pills (=1000mg ) by mouth at 1pm, 3pm, and 10pm the day before your colorectal operation   500 mg Oral 3 times daily 06/03/24 0935 06/03/24 1555   05/29/24 1500  DAPTOmycin  (CUBICIN ) IVPB 500 mg/50mL premix  Status:  Discontinued        8 mg/kg  60.9 kg 100 mL/hr over 30 Minutes Intravenous Daily 05/29/24 1359 06/04/24 0738   05/28/24 2100  vancomycin  (VANCOREADY) IVPB 750 mg/150 mL  Status:  Discontinued        750 mg 150 mL/hr over 60 Minutes Intravenous Every 12 hours 05/28/24 1419 05/29/24 1359   05/27/24 0800  vancomycin  (VANCOCIN ) IVPB 1000 mg/200 mL premix  Status:  Discontinued        1,000 mg 200 mL/hr over 60 Minutes Intravenous Every 24 hours 05/26/24 1144 05/28/24 1419   05/23/24 1400  piperacillin -tazobactam (ZOSYN )  IVPB 3.375 g  Status:  Discontinued        3.375 g 12.5 mL/hr over 240 Minutes Intravenous Every 8 hours 05/23/24 0804 06/04/24 0738   05/23/24 1200  micafungin  (MYCAMINE ) 100 mg in sodium chloride  0.9 % 100 mL IVPB        100 mg 105 mL/hr over 1 Hours Intravenous Every 24 hours 05/23/24 0755 06/05/24 1221   05/22/24 0800  vancomycin  (VANCOCIN ) IVPB 1000 mg/200 mL premix  Status:  Discontinued        1,000 mg 200 mL/hr over 60 Minutes Intravenous Every 24 hours 05/21/24 0634 05/26/24 0806   05/21/24 0730  vancomycin  (VANCOREADY) IVPB 1250 mg/250 mL        1,250 mg 166.7 mL/hr over 90 Minutes Intravenous  Once 05/21/24 0634 05/21/24 1043   05/19/24 1400  piperacillin -tazobactam (ZOSYN ) IVPB 3.375 g  Status:  Discontinued        3.375 g 12.5 mL/hr over 240 Minutes Intravenous Every 8 hours 05/19/24 1303 05/23/24 0804   05/09/24 0900  erythromycin  250 mg in sodium chloride  0.9 % 100 mL IVPB  250 mg 100 mL/hr over 60 Minutes Intravenous Every 8 hours 05/09/24 0732 05/11/24 0044   05/08/24 2200  cefoTEtan  (CEFOTAN ) 2 g in sodium chloride  0.9 % 100 mL IVPB        2 g 200 mL/hr over 30 Minutes Intravenous Every 12 hours 05/08/24 1759 05/09/24 0749   05/08/24 1400  neomycin  (MYCIFRADIN ) tablet 1,000 mg  Status:  Discontinued       Placed in And Linked Group   1,000 mg Oral 3 times per day 05/08/24 1119 05/08/24 1120   05/08/24 1400  metroNIDAZOLE  (FLAGYL ) tablet 1,000 mg  Status:  Discontinued       Placed in And Linked Group   1,000 mg Oral 3 times per day 05/08/24 1119 05/08/24 1120   05/08/24 1130  cefoTEtan  (CEFOTAN ) 2 g in sodium chloride  0.9 % 100 mL IVPB        2 g 200 mL/hr over 30 Minutes Intravenous On call to O.R. 05/08/24 1119 05/09/24 0749      Subjective:  Today, patient was seen and examined at bedside.  Patient continues to complain of abdominal pain.  Denies any nausea vomiting fever chills or rigor.  Feels fatigued and weak.  Objective: Vitals:   06/09/24  0418 06/09/24 1252  BP: (!) 150/75 136/70  Pulse: 96 99  Resp: 16 20  Temp: 98.6 F (37 C) 97.8 F (36.6 C)  SpO2: 98% 99%    Intake/Output Summary (Last 24 hours) at 06/09/2024 1423 Last data filed at 06/09/2024 1252 Gross per 24 hour  Intake --  Output 3560 ml  Net -3560 ml   Filed Weights   06/07/24 0500 06/08/24 0442 06/09/24 0500  Weight: 62.3 kg 64.8 kg 66.9 kg   Body mass index is 24.54 kg/m.   Physical Exam: General:  Average built, chronically ill and deconditioned, on nasal cannula oxygen HENT:   No scleral pallor or icterus noted. Oral mucosa is moist.  Chest:   Diminished breath sounds bilaterally  CVS: S1 &S2 heard. No murmur.  Regular rate and rhythm. Abdomen: Soft, tenderness noted with gastrostomy tube, abdominal drain in place, urethral catheter in place, colostomy right upper quadrant which is functioning. Extremities: Right upper extremity PICC line in place. Psych: Alert, awake and oriented, normal mood CNS:  No cranial nerve deficits.  Generalized weakness noted Skin: Warm and dry.  Abdominal incision with dressing.   Data Review: I have personally reviewed the following laboratory data and studies,  CBC: Recent Labs  Lab 06/05/24 0429 06/06/24 0346 06/07/24 0423 06/08/24 0406 06/09/24 0406  WBC 13.5* 11.7* 13.1* 10.3 10.8*  HGB 7.1* 8.0* 8.6* 8.0* 7.4*  HCT 23.3* 25.8* 27.6* 26.0* 24.4*  MCV 95.5 90.5 89.9 92.5 92.1  PLT 361 353 380 319 351   Basic Metabolic Panel: Recent Labs  Lab 06/04/24 0609 06/05/24 0830 06/06/24 0346 06/07/24 0423 06/08/24 0406 06/09/24 0406  NA 132* 138 135 133* 133* 134*  K 3.9 4.0 3.8 3.8 3.4* 3.9  CL 101 107 104 101 104 105  CO2 22 22 22  21* 22 22  GLUCOSE 151* 140* 121* 133* 124* 128*  BUN 23 21 21 19 23 23   CREATININE 0.77 0.55 0.61 0.47 0.43* 0.46  CALCIUM  7.7* 8.0* 8.0* 7.8* 7.6* 7.7*  MG 1.8 1.9 1.9 2.0  --  1.8  PHOS 2.8 1.9* 2.2* 2.5  --  2.7   Liver Function Tests: Recent Labs  Lab  06/04/24 0609 06/05/24 0830 06/09/24 0406  AST 34 26 22  ALT 23 21 21   ALKPHOS 129* 146* 196*  BILITOT 1.2 1.3* 1.4*  PROT 6.2* 6.4* 6.1*  ALBUMIN  1.7* 1.7* 1.5*   No results for input(s): LIPASE, AMYLASE in the last 168 hours. No results for input(s): AMMONIA in the last 168 hours. Cardiac Enzymes: Recent Labs  Lab 06/07/24 0423  CKTOTAL 19*    BNP (last 3 results) No results for input(s): BNP in the last 8760 hours.  ProBNP (last 3 results) No results for input(s): PROBNP in the last 8760 hours.  CBG: Recent Labs  Lab 06/08/24 1602 06/08/24 1956 06/08/24 2359 06/09/24 0802 06/09/24 1144  GLUCAP 129* 140* 109* 121* 146*   Recent Results (from the past 240 hours)  Surgical pcr screen     Status: Abnormal   Collection Time: 06/02/24  7:55 PM   Specimen: Nasal Mucosa; Nasal Swab  Result Value Ref Range Status   MRSA, PCR POSITIVE (A) NEGATIVE Final   Staphylococcus aureus POSITIVE (A) NEGATIVE Final    Comment: (NOTE) The Xpert SA Assay (FDA approved for NASAL specimens in patients 67 years of age and older), is one component of a comprehensive surveillance program. It is not intended to diagnose infection nor to guide or monitor treatment. Performed at [facility], [address], [place], [place] [address]      Studies: No results found.     Ashtan Laton, MD  [facility] 06/09/2024  If 7PM-7AM, please contact night-coverage

## 2024-06-09 NOTE — Progress Notes (Signed)
 Daily Progress Note   Patient Name: Elizabeth Odom       Date: 06/09/2024 DOB: [birth year]  Age: 83 y.o. MRN#: [id number] Attending Physician: Sonjia Held, MD Primary Care Physician: Jesus Bernardino MATSU, MD Admit Date: 05/08/2024  Reason for Consultation/Follow-up: Establishing goals of care  Subjective: Resting in bed SLP colleagues at bedside Appears in no acute distress Chart reviewed Surgery note reviewed CT abdomen and pelvis today  Length of Stay: 32  Current Medications: Scheduled Meds:   acetaminophen   1,000 mg Per Tube Q6H   vitamin C   500 mg Oral BID   Chlorhexidine  Gluconate Cloth  6 each Topical Daily   ferrous sulfate  325 mg Oral BID WC   methocarbamol  (ROBAXIN ) injection  500 mg Intravenous Q8H   metoprolol  tartrate  12.5 mg Oral BID   nystatin    Topical BID   mouth rinse  15 mL Mouth Rinse 4 times per day   pantoprazole  (PROTONIX ) IV  40 mg Intravenous Daily   potassium chloride   40 mEq Oral Daily   sodium chloride  flush  10-40 mL Intracatheter Q12H   sodium chloride  flush  3 mL Intravenous Q12H    Continuous Infusions:  sodium chloride  10 mL/hr at 05/26/24 2028   feeding supplement (OSMOLITE 1.5 CAL) 1,000 mL (06/09/24 0826)   heparin  1,550 Units/hr (06/09/24 0913)   iron  sucrose 440 mL/hr at 06/06/24 1722   micafungin  (MYCAMINE ) 100 mg in sodium chloride  0.9 % 100 mL IVPB 100 mg (06/09/24 1000)   TPN ADULT (ION) 70 mL/hr at 06/08/24 1725   TPN ADULT (ION)      PRN Meds: sodium chloride , alum & mag hydroxide-simeth, diphenhydrAMINE -zinc  acetate, hydrALAZINE , HYDROmorphone  (DILAUDID ) injection, lip balm, menthol -cetylpyridinium, metoprolol  tartrate, naphazoline-glycerin , [DISCONTINUED] ondansetron  **OR** ondansetron  (ZOFRAN ) IV, oxyCODONE , phenol,  [DISCONTINUED] prochlorperazine  **OR** prochlorperazine , simethicone , sodium chloride , sodium chloride  flush, witch hazel-glycerin   Physical Exam         Appears weak and chronically ill but not in acute distress  Chart reviewed  Vital Signs: BP (!) 150/75 (BP Location: Left Arm)   Pulse 96   Temp 98.6 F (37 C) (Oral)   Resp 16   Ht 5' 5 (1.651 m)   Wt 66.9 kg   SpO2 98%   BMI 24.54 kg/m  SpO2: SpO2: 98 % O2 Device: O2 Device: Nasal  Cannula O2 Flow Rate: O2 Flow Rate (L/min): 4 L/min  Intake/output summary:  Intake/Output Summary (Last 24 hours) at 06/09/2024 1103 Last data filed at 06/09/2024 0956 Gross per 24 hour  Intake --  Output 2830 ml  Net -2830 ml   LBM: Last BM Date : 06/08/24 Baseline Weight: Weight: 57.6 kg Most recent weight: Weight: 66.9 kg       Palliative Assessment/Data:      Patient Active Problem List   Diagnosis Date Noted   Hypokalemia 05/15/2024   Protein-calorie malnutrition, severe 05/12/2024   History of anemia 05/08/2024   Pre-diabetes 05/08/2024   Diverticular disease 05/08/2024   Premature atrial contraction 04/08/2024   Mixed hyperlipidemia 04/08/2024   Essential hypertension 04/07/2024   Diverticular disease of left colon 04/07/2024   History of DVT (deep vein thrombosis) 04/07/2024   History of pulmonary embolus (PE) 04/07/2024   Presence of IVC filter 04/07/2024   Stricture of sigmoid colon (HCC) 04/07/2024   Irritant contact dermatitis associated with fecal stoma 03/10/2024   Pancreatic cyst 01/02/2024   Hypoalbuminemia 01/02/2024   Acquired renal cyst of right kidney 01/02/2024   Fatty liver 01/02/2024   Ambulates with cane 01/02/2024   Iron  deficiency anemia due to chronic blood loss 10/31/2023   Acute pulmonary embolism (HCC) 10/31/2023   Hyponatremia 10/31/2023   Acute urinary retention 10/31/2023   Malnutrition of moderate degree 10/17/2023   High risk medication use 10/16/2023   Pain 10/16/2023   Pressure  injury of skin 10/16/2023   Left leg DVT (HCC) 10/05/2023   ABLA (acute blood loss anemia) 10/05/2023   Palliative care encounter 10/05/2023   Counseling and coordination of care 10/05/2023   Need for emotional support 10/05/2023   Acute colitis 09/27/2023   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  Surgical colleagues following closely.  On TPN, oral diet is being attempted SLP consulted, palliative care consulted for additional layer of support and for ongoing goals of care discussions.  Recommendations/Plan: Continue current mode of care, as per initial goals of care discussions with HCPOA on 06-08-2024 at the time of initial consult-full code full scope and family remains hopeful for ongoing stabilization/recovery even if it is slow. Monitor for pain and non-- pain symptom management Follow-up results of CT abdomen and pelvis Palliative following peripherally and will continue goals of care discussions with patient and granddaughter Augustin. Recommend SNF rehab with palliative on discharge if possible.  Goals of Care and Additional Recommendations: Limitations on Scope of Treatment: Full Scope Treatment  Code Status:    Code Status Orders  (From admission, onward)           Start     Ordered   05/08/24 1759  Full code  Continuous       Question:  By:  Answer:  Consent: discussion documented in EHR   05/08/24 1759            Code Status History     Date Active Date Inactive Code Status  Order ID Comments User Context   10/04/2023 1941 11/09/2023 1950 Limited: Do not attempt resuscitation (DNR) -DNR-LIMITED -Do Not Intubate/DNI  [id number]  Noreen Tonnie BRAVO, MD Inpatient   09/27/2023 2335 10/04/2023 1940 Full Code [id number]  Laveda Roosevelt, MD ED   04/09/2016 0504 04/10/2016 2032 Full Code [id number]  Mavis Ritchie BROCKS, MD ED      Advance Directive Documentation    Flowsheet Row Most Recent Value  Type of Advance Directive Healthcare Power of Attorney  Pre-existing out of facility DNR order (yellow form or pink MOST form) --  MOST Form in Place? --    Prognosis:  Unable to determine  Discharge Planning: Recommend skilled Nursing Facility for rehab with Palliative care service follow-up  Care plan was discussed with interdisciplinary team  Thank you for allowing the Palliative Medicine Team to assist in the care of this patient. Low MDM     Greater than 50%  of this time was spent counseling and coordinating care related to the above assessment and plan.  Lonia Serve, MD  Please contact Palliative Medicine Team phone at [phone] for questions and concerns.

## 2024-06-09 NOTE — Progress Notes (Signed)
 Physical Therapy Treatment Patient Details Name: Elizabeth Odom MRN: [id number] DOB: 1941-08-13 Today's Date: 06/09/2024   History of Present Illness Pt is 83 y.o. female who presented on 05/08/24 for elective colostomy takedown. Course complicated by A-fib with RVR and then hypotension/rectal bleeding and syncope on 05/11/2024.  Patient  had postop ileus needing NG tube and TPN.  Imaging on 05/19/2024 showed possible abscesses with transgluteal drain on 6/10 and CT guided drain to abdomen and pelvic abscesses yielding pus and stool.  Back to OR 6/24 for LOA, colostomy placement, drainage of abscess, and placement of g-tube with hypotension in OR requiring vasopressors, able to extubate 6/25. 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    PT Comments  Pt due for updated goals with therapy.  Pt with decline in medical status/further surgery since initial goals.  Pt also with limited therapy sessions due to tolerance and medical complications.  Goals downgraded at this time.  Do continue to recommend Patient will benefit from continued inpatient follow up therapy, <3 hours/day at d/c.  Today, pt with limited participation.  Pt reports not feeling well and nauseated.  Pt does appear to feel poorly.  Tried to encourage any OOB activity (even sitting EOB) but pt refused.  She did agree to supine exercises but needs AAROM for most exercises and cues for every rep.  Additionally, tried to determine the best time of day to see pt but she was unable to state.  Will attempt to continue POC as able.     If plan is discharge home, recommend the following: Two people to help with walking and/or transfers;Two people to help with bathing/dressing/bathroom;Assistance with cooking/housework;Help with  stairs or ramp for entrance;Assist for transportation   Can travel by private vehicle     No  Equipment Recommendations  Hospital bed;Wheelchair cushion (measurements PT);Wheelchair (measurements PT);Hoyer lift;BSC/3in1    Recommendations for Other Services       Precautions / Restrictions Precautions Precautions: Fall Precaution/Restrictions Comments: ABD surgery,  abdominal JP     Mobility  Bed Mobility               General bed mobility comments: Pt refusing any bed mobility or transfers despite max encouragement/education.  Reports not today , doesn't feel well and nauseate. Discussed that hasn't been OOB since 05/27/24 wtih therapy and several refusals.  Pt continue to decline.  Tried to determine best time of day to help set schedule for increased participation but pt not answering.  Pt does physically appear to feel poorly.  She did agree to bed exercises.  At end of session, pt more alert and reports back itching.  Had her hold bed rails and gently pull up to partial long sitting to apply lotion; pt pulling up slightly 3 x with mod A from 52 degrees elevation    Transfers                        Ambulation/Gait                   Stairs             Wheelchair Mobility     Tilt Bed    Modified Rankin (Stroke Patients Only)       Balance  Sitting balance - Comments: refused                                    Communication    Cognition Arousal: Alert (often keeps eyes closed but responds and at end of session eyes open to watch tv) Behavior During Therapy: Flat affect   PT - Cognitive impairments: Initiation, Attention, Sequencing                       PT - Cognition Comments: frequent cues during exercise repetitions Following commands: Impaired Following commands impaired: Follows one step commands with increased time    Cueing    Exercises General Exercises - Lower Extremity Ankle  Circles/Pumps: AAROM, Both, 10 reps, Supine Quad Sets: AAROM, Both, 10 reps, Supine Heel Slides: AAROM, Both, 5 reps, Supine Hip ABduction/ADduction: AAROM, Both, 5 reps, Supine Other Exercises Other Exercises: Chest press : AAROM , supine , 10 reps, Bil Other Exercises: Reaching across body, trying to lift scapula off bed minimally: AAROM, bil, supine 5 reps Other Exercises: For all exercises requires constant cues to keep going    General Comments        Pertinent Vitals/Pain Pain Assessment Pain Assessment: Faces Faces Pain Scale: Hurts even more Pain Location: abdomen Pain Descriptors / Indicators: Moaning, Discomfort, Tightness, Grimacing Pain Intervention(s): Limited activity within patient's tolerance, Monitored during session, Premedicated before session, Repositioned    Home Living                          Prior Function            PT Goals (current goals can now be found in the care plan section) Acute Rehab PT Goals Patient Stated Goal: not stated PT Goal Formulation: With patient Time For Goal Achievement: 06/23/24 Potential to Achieve Goals: Fair Progress towards PT goals: Not progressing toward goals - comment;Goals downgraded-see care plan (medical complications/further sx since goals written)    Frequency    Min 2X/week      PT Plan      Co-evaluation              AM-PAC PT 6 Clicks Mobility   Outcome Measure  Help needed turning from your back to your side while in a flat bed without using bedrails?: Total Help needed moving from lying on your back to sitting on the side of a flat bed without using bedrails?: Total Help needed moving to and from a bed to a chair (including a wheelchair)?: Total Help needed standing up from a chair using your arms (e.g., wheelchair or bedside chair)?: Total Help needed to walk in hospital room?: Total Help needed climbing 3-5 steps with a railing? : Total 6 Click Score: 6    End of Session    Activity Tolerance: Patient limited by fatigue Patient left: in bed;with call bell/phone within reach;with bed alarm set Nurse Communication: Mobility status;Other (comment) (pt w c/o back itching) PT Visit Diagnosis: Muscle weakness (generalized) (M62.81);Difficulty in walking, not elsewhere classified (R26.2)     Time: 1537-1600 PT Time Calculation (min) (ACUTE ONLY): 23 min  Charges:    $Therapeutic Exercise: 23-37 mins PT General Charges $$ ACUTE PT VISIT: 1 Visit                     Benjiman, PT Acute Rehab Services [facility]  [phone]    Benjiman VEAR [place] 06/09/2024, 5:07 PM

## 2024-06-09 NOTE — Progress Notes (Signed)
 06/09/2024  Elizabeth Odom [id number] [birth year]  CARE TEAM: PCP: Jesus Bernardino MATSU, MD  Outpatient Care Team: Patient Care Team: Jesus Bernardino MATSU, MD as PCP - General (Internal Medicine) Elmira Newman PARAS, MD as PCP - Cardiology (Cardiology) [name], MD as Consulting Physician (Gastroenterology) Rubin Calamity, MD as Consulting Physician (General Surgery) [name], MD (Neurology) [name], MD as Consulting Physician (Urology) [name], MD as Consulting Physician (Cardiology) [name], MD as Consulting Physician (Colon and Rectal Surgery)  Inpatient Treatment Team: Treatment Team:  Pokhrel, Laxman, MD Nikki Rams, Aliene, MD Dennise Kingsley, MD [name], RN Rosan Deward ORN, NP Bobbette File, MD Sonjia Held, MD [name], MD [name], DELENA, OT Shlomo Shaver, RN Manker, Mylinda, RN Norval, Dwha W, NT Bethene Stefano POUR, [facility]   Problem List:   Principal Problem:   Diverticular disease Active Problems:   A-fib [facility])   Restless leg   ABLA (acute blood loss anemia)   Need for emotional support   Acute urinary retention   Mixed hyperlipidemia   Essential hypertension   Diverticular disease of left colon   History of DVT (deep vein thrombosis)   History of pulmonary embolus (PE)   Pre-diabetes   Presence of IVC filter   Stricture of sigmoid colon (HCC)   Protein-calorie malnutrition, severe   Hypokalemia   05/08/2024  POST-OPERATIVE DIAGNOSIS:   COLOSTOMY FOR RESECTION, DESIRE FOR OSTOMY TAKEDOWN RECTAL STRICTURE HISTORY OF DIVERTICULITIS WITH PERFORATION & ABSCESS   PROCEDURE:   -ROBOTIC RECTOSIGMOID RESECTION (LAR) -TAKEDOWN OF END COLOSTOMY WITH ANASTOMOSIS -RESECTION OF SMALL INTESTINE WITH ANASTOMOSIS -SMALL BOWEL REPAIR -LYSIS OF ADHESIONS x 115 MINUTES (66% OF CASE),  -INTRAOPERATIVE ASSESSMENT OF TISSUE VASCULAR PERFUSION USING ICG (indocyanine green ) IMMUNOFLUORESCENCE,  -TRANSVERSUS ABDOMINIS PLANE  (TAP) BLOCK - BILATERAL -FLEXIBLE SIGMOIDOSCOPY   SURGEON:  [name], MD   05/20/2024  Post procedural Dx: Post op abscess, multiple   Technically successful CT guided placed of a  10 Fr drainage catheter placement x 2  into the midline ventral and dorsal perirectal abscesses.     [name] DELENA, MD    05/28/2024  IMPRESSION: Satisfactory removal of the midline anterior abscess drainage catheter and placement of a new midline anterior pelvic drainage catheter as well as a posterior transgluteal drainage catheter in pelvic abscesses.     Electronically Signed   By: [name]    06/03/2024  POST-OPERATIVE DIAGNOSIS:   DELAYED COLORECTAL ANASTOMOTIC LEAK, NEED FOR FECAL DIVERSION CHRONIC PELVIC ABSCESS   PROCEDURE:   DIAGNOSTIC LAPAROSCOPY CONVERTED TO LAPAROTOMY DIVERTING LOOP TRANSVERSE COLOSTOMY DRAINAGE OF ABDOMINAL ABSCESS GASTROSTOMY TUBE PLACEMENT RECTAL TUBE PLACEMENT   SURGEON:  [name], MD   OR FINDINGS: Very dense concrete intraperitoneal and pelvic adhesions infraumbilically.  Bladder, small bowel, omentum walling off the lower pelvis with delayed anastomotic leak.   Near disruption of colorectal anastomosis by transanal examination.  Rectal tube placed through rectal stump to drain the pelvis more directly.   Diverting mid-transverse loop colostomy done.   Removal of old percutaneous abdominal and transgluteal drains.     Assessment [facility] Stay = 32 days) 6 Days Post-Op    Failure to thrive with delayed anastomotic leak    Plan:  Delayed anastomotic leak  status post percutaneous drainage x 2  diverting loop transverse colostomy (lower abdomen extremely fixed).  6/25 with very fixed abdomen.    Internal drainage with rectal tube overnight.  Removed POD#1.  Should continue to  transanally drain  Intraperitoneal drainage going over near prior abdominal abscess down towards left pelvis.  Keep for now.  Concern for  drainage at old transgluteal site.  I removed a percutaneous drain when I did the diverting colostomy on 6/25.  She is going to have some intermittent anal and a possible old drain site drainage.  She is diverted.  Keep the area clean and dry with sitz bath's.  They ordered 3 times daily and as needed.  Severe malnutrition -biggest risk for now  TPN at 100% for now.  Has return of bowel function and tolerating G tube clamping.  Start tube feeds through gastrostomy tube.  Start oral diet.  If tolerating tube feeds, wean off TPN, cycle, intermittent bolus feeds over the next week hopefully   Okay to try sips for now.  Infection: -Zosyn , Daptomycin , Micafungin  per ID   -Because she has source control, I would lean towards doing 5 more days and stopping to regroup since there was concern of the MRSA and Candida being colonization may be the only legitimate organism is Pseudomonas of colonization anyway.  Redo MRSA decolonization.    - Clinically does not act like she is getting septic.  Will order CT scan abdomen pelvis today.  If there are no undrained collections, remove low output surgical drain and stop antibiotics and regroup.  See if infectious disease agrees.  Some recurrent atrial fibrillation and rapid rate.   Transition to enteral metoprolol .  See if medicine agrees.  Anticoagulation.  On heparin  drip.  If CAT scan negative and no need for drains or procedures, switch over to DOAC and stop heparin  drip.  History of thromboembolic events and IVC filter -anticoagulation as above.    Acute blood loss anemia in the setting of anemia of chronic disease.    Transfused 6/26.  Transfuse again if hemoglobin less than 7.  IV iron .    Will try and add enteral iron  with vitamin C .  I would not trust enteral route to be totally adequate but may help  Appears totally fluid overloaded needing oxygen.  Will give Lasix  x 1.  History of urinary retention.  Will keep Foley catheter for  now.  May consider removing with intermittent I&O cath or Flexi-Seal if needed.  Get CAT scan and diuresis first.  See if medicine agrees.    -monitor electrolytes & replace as needed.  Keep K>4, Mg>2, Phos>3.  Intermittent potassium supplementation.  Will give enteral potassium x 5 days and follow since I suspect she will need more diuresis.  See if medicine agrees  -VTE prophylaxis- SCDs.  Anticoagulation as appropriate  -mobilize as tolerated to help recovery.  Try and get therapists involved to help her mobilize more.  Biggest challenge.  Patient does not like the idea of going to a skilled facility or rehab as she needed after her month hospital stay last year from emergency surgery.  I do not think that that is avoidable at this time. Work to have strategies to help her get up and mobilize and actually work with physical therapy, or she is going to lose her argument.    I updated the patient's status to the patient and nurse.  Recommendations were made.  Questions were answered.  They expressed understanding & appreciation.  -Disposition: TBD - I think she needs SNF.  She has been very resistant, but she may reconsider.  Agree with palliative care in the hopes of providing better pain and symptom control.  I am hopeful  that if we can get her nutrition better mobilize she will recover but it is a challenge.  She has been through a lot.      I reviewed last 24 h vitals and pain scores, last 48 h intake and output, last 24 h labs and trends, and last 24 h imaging results.  I have reviewed this patient's available data, including medical history, events of note, test results, etc as part of my evaluation.   A significant portion of that time was spent in counseling. Care during the described time interval was provided by me.  This care required moderate level of medical decision making.  06/09/2024    Subjective: (Chief complaint)  Tired and disheartened.  Still with abdominal pain but  getting enteral meds.  They do help.  No nausea or vomiting but not particular hungry.    Objective:  Vital signs:  Vitals:   06/08/24 1731 06/08/24 1951 06/09/24 0418 06/09/24 0500  BP: (!) 167/97 132/88 (!) 150/75   Pulse: 78 93 96   Resp: 20 16 16    Temp: 97.7 F (36.5 C) 97.7 F (36.5 C) 98.6 F (37 C)   TempSrc: Oral Oral Oral   SpO2: 99% 100% 98%   Weight:    66.9 kg  Height:        Last BM Date : 06/08/24  Intake/Output   Yesterday:  06/29 0701 - 06/30 0700 In: 60  Out: 2155 [Urine:1800; Drains:5; Stool:350] This shift:  No intake/output data recorded.  Bowel function:  Flatus: No  BM:  No   Drain:  Left lower quadrant Blake drain serosanguineous going down to left lateral pelvis.  Physical Exam:  General: Extubated.  A little withdrawn but answers questions.  Exhausted.  Depressed.           Eyes: PERRL, normal EOM.  Sclera clear.  No icterus Neuro: CN II-XII intact w/o focal sensory/motor deficits.  No facial droop. Lymph: No head/neck/groin lymphadenopathy Psych:  No delerium/psychosis/paranoia.  Oriented x 4 HENT: Normocephalic, Mucus membranes moist.  No thrush.  Mild HOH Neck: Supple, No tracheal deviation.  No obvious thyromegaly.  Chest: No pain to chest wall compression.  Good respiratory excursion.  Mild audible wheezing CV:  Pulses intact.  Regular rhythm.  No major extremity edema MS: Normal AROM mjr joints.  No obvious deformity  Abdomen:  Soft.  Mildy distended.  Mildly tender at incisions only.  No guarding.   Right upper quadrant mid transverse diverting loop colostomy pink with edema.  Gas and stool in bag    GU: Foley in place with light yellow clear urine  Rectal: Some erythema but improved overall.  No diarrhea in bed. . Ext:   No deformity.  No mjr edema.  No cyanosis Skin: No petechiae / purpurea.  No major sores.  Warm and dry    Results:   Cultures: Recent Results (from the past 720 hours)  MIC (1 Drug)-Abdominal  drain abscess; 05/22/2024; Abdomen; MRSA; Daptomycin      Status: Abnormal   Collection Time: 05/22/24  1:54 PM   Specimen: Abdomen  Result Value Ref Range Status   Min Inhibitory Conc (1 Drug) Final report (A)  Corrected    Comment: (NOTE) Performed At: [facility] [address] [place], [place] [id number] Jennette Shorter MD Ey:[id number] CORRECTED ON 06/24 AT 0836: PREVIOUSLY REPORTED AS Preliminary report    Source ABSCESS  Final    Comment: Performed at [facility], [address], [place],  [place] [address]  MIC Result     Status: Abnormal   Collection Time: 05/22/24  1:54 PM  Result Value Ref Range Status   Result 1 (MIC) Comment (A)  Final    Comment: (NOTE) Methicillin - resistant Staphylococcus aureus Identification performed by account, not confirmed by this laboratory. DAPTOMYCIN    <=1 UG/ML = SUSCEPTIBLE Performed At: [facility] [address] [place], [place] [id number] Jennette Shorter MD Ey:[id number]   Aerobic/Anaerobic Culture w Gram Stain (surgical/deep wound)     Status: None   Collection Time: 05/22/24  3:59 PM   Specimen: Abdomen  Result Value Ref Range Status   Specimen Description   Final    ABDOMEN Performed at [facility], [address] [place], [place] [address]    Special Requests   Final    ABDOMINAL DRAIN Performed at [facility], [address], [place], [place] [address]    Gram Stain   Final    ABUNDANT WBC PRESENT, PREDOMINANTLY PMN RARE GRAM POSITIVE COCCI RARE YEAST WITH PSEUDOHYPHAE    Culture   Final    MODERATE PSEUDOMONAS AERUGINOSA MODERATE STAPHYLOCOCCUS AUREUS SUSCEPTIBILITIES PERFORMED ON PREVIOUS CULTURE WITHIN THE LAST 5 DAYS. MODERATE CANDIDA ALBICANS SEE SEPARATE REPORT NO ANAEROBES ISOLATED Performed at [facility], [address], [place], [place] [address]    Report Status 06/04/2024 FINAL  Final   Organism ID, Bacteria PSEUDOMONAS AERUGINOSA   Final      Susceptibility   Pseudomonas aeruginosa - MIC*    CEFTAZIDIME 4 SENSITIVE Sensitive     CIPROFLOXACIN <=0.25 SENSITIVE Sensitive     GENTAMICIN 4 SENSITIVE Sensitive     IMIPENEM 2 SENSITIVE Sensitive     PIP/TAZO <=4 SENSITIVE Sensitive ug/mL    CEFEPIME 2 SENSITIVE Sensitive     * MODERATE PSEUDOMONAS AERUGINOSA  Aerobic/Anaerobic Culture w Gram Stain (surgical/deep wound)     Status: None   Collection Time: 05/22/24  3:59 PM   Specimen: Back  Result Value Ref Range Status   Specimen Description   Final    BACK Performed at [facility], [address], [place], [place] [address]    Special Requests   Final    TRANSGLUTEAL PERCUTANEOUS IR DRAIN Performed at [facility], [address], [place], [place] [address]    Gram Stain   Final    NO WBC SEEN ABUNDANT GRAM NEGATIVE RODS RARE GRAM POSITIVE COCCI IN PAIRS RARE BUDDING YEAST SEEN    Culture   Final    ABUNDANT PSEUDOMONAS AERUGINOSA ABUNDANT METHICILLIN RESISTANT STAPHYLOCOCCUS AUREUS ABUNDANT ENTEROCOCCUS FAECALIS ABUNDANT CANDIDA ALBICANS NO ANAEROBES ISOLATED Sent to [facility] for further susceptibility testing. Performed at [facility], [address] [place], [place] [address]    Report Status 05/27/2024 FINAL  Final   Organism ID, Bacteria PSEUDOMONAS AERUGINOSA  Final   Organism ID, Bacteria METHICILLIN RESISTANT STAPHYLOCOCCUS AUREUS  Final   Organism ID, Bacteria ENTEROCOCCUS FAECALIS  Final      Susceptibility   Enterococcus faecalis - MIC*    AMPICILLIN <=2 SENSITIVE Sensitive     VANCOMYCIN  1 SENSITIVE Sensitive     GENTAMICIN SYNERGY SENSITIVE Sensitive     * ABUNDANT ENTEROCOCCUS FAECALIS   Methicillin resistant staphylococcus aureus - MIC*    CIPROFLOXACIN >=8 RESISTANT Resistant     ERYTHROMYCIN  >=8 RESISTANT Resistant     GENTAMICIN <=0.5 SENSITIVE Sensitive     OXACILLIN >=4 RESISTANT Resistant     TETRACYCLINE <=  1 SENSITIVE Sensitive      VANCOMYCIN  1 SENSITIVE Sensitive     TRIMETH/SULFA >=320 RESISTANT Resistant     CLINDAMYCIN <=0.25 SENSITIVE Sensitive     RIFAMPIN <=0.5 SENSITIVE Sensitive     Inducible Clindamycin NEGATIVE Sensitive     LINEZOLID 2 SENSITIVE Sensitive     * ABUNDANT METHICILLIN RESISTANT STAPHYLOCOCCUS AUREUS   Pseudomonas aeruginosa - MIC*    CEFTAZIDIME 4 SENSITIVE Sensitive     CIPROFLOXACIN <=0.25 SENSITIVE Sensitive     GENTAMICIN 4 SENSITIVE Sensitive     IMIPENEM 2 SENSITIVE Sensitive     PIP/TAZO <=4 SENSITIVE Sensitive ug/mL    CEFEPIME 2 SENSITIVE Sensitive     * ABUNDANT PSEUDOMONAS AERUGINOSA  Yeast Susceptibilities     Status: None   Collection Time: 05/22/24  3:59 PM  Result Value Ref Range Status   SOURCE CANDIDA ALBICAN ABD ABSC  Final    Comment: Performed at [facility], [address], [place], [place] [address]   Organism ID, Yeast Candida albicans  Final    Comment: (NOTE) Identification performed by account, not confirmed by this laboratory.    Amphotericin B MIC 0.25 ug/mL  Final    Comment: (NOTE) Breakpoints have been established for only some organism-drug combinations as indicated. This test was developed and its performance characteristics determined by [facility]. It has not been cleared or approved by the Food and Drug Administration.    Anidulafungin MIC Comment  Final    Comment: (NOTE) 0.03 ug/mL Susceptible Breakpoints have been established for only some organism-drug combinations as indicated. This test was developed and its performance characteristics determined by [facility]. It has not been cleared or approved by the Food and Drug Administration.    Caspofungin MIC Comment  Final    Comment: (NOTE) 0.12 ug/mL Susceptible Breakpoints have been established for only some organism-drug combinations as indicated. This test was developed and its performance characteristics determined by [facility]. It has not been cleared or approved by the Food  and Drug Administration.    Fluconazole  Islt MIC 1.0 ug/mL Susceptible  Final    Comment: (NOTE) Breakpoints have been established for only some organism-drug combinations as indicated. This test was developed and its performance characteristics determined by [facility]. It has not been cleared or approved by the Food and Drug Administration.    ISAVUCONAZOLE MIC 0.008 ug/mL or less  Final    Comment: (NOTE) This test was developed and its performance characteristics determined by [facility]. It has not been cleared or approved by the Food and Drug Administration.    Itraconazole MIC 0.06 ug/mL  Final    Comment: (NOTE) Breakpoints have been established for only some organism-drug combinations as indicated. This test was developed and its performance characteristics determined by [facility]. It has not been cleared or approved by the Food and Drug Administration.    Micafungin  MIC Comment  Final    Comment: (NOTE) 0.008 ug/mL or less, Susceptible Breakpoints have been established for only some organism-drug combinations as indicated. This test was developed and its performance characteristics determined by [facility]. It has not been cleared or approved by the Food and Drug Administration.    Posaconazole MIC 0.06 ug/mL  Final    Comment: (NOTE) Breakpoints have been established for only some organism-drug combinations as indicated. This test was developed and its performance characteristics determined by [facility]. It has not been cleared or approved by the Food and Drug Administration.    REZAFUNGIN MIC Comment  Final  Comment: (NOTE) 0.15 ug/mL Susceptible This test was developed and its performance characteristics determined by [facility]. It has not been cleared or approved by the Food and Drug Administration.    Voriconazole MIC Comment  Final    Comment: (NOTE) 0.15 ug/mL Susceptible Breakpoints have been established for only some organism-drug combinations as  indicated. This test was developed and its performance characteristics determined by [facility]. It has not been cleared or approved by the Food and Drug Administration. Performed At: [facility] [address] [place], [place] [id number] Jennette Shorter MD Ey:[id number]   Aerobic/Anaerobic Culture w Gram Stain (surgical/deep wound)     Status: None   Collection Time: 05/27/24  5:19 PM   Specimen: Abscess  Result Value Ref Range Status   Specimen Description   Final    ABSCESS ABDOMEN Performed at [facility], [address], [place], [place] [address]    Special Requests   Final    NONE Performed at [facility], [address], [place], [place] [address]    Gram Stain   Final    ABUNDANT WBC PRESENT, PREDOMINANTLY PMN NO ORGANISMS SEEN    Culture   Final    RARE PSEUDOMONAS AERUGINOSA RARE CANDIDA ALBICANS NO ANAEROBES ISOLATED Performed at [facility], [address] [place], [place] [address]    Report Status 06/01/2024 FINAL  Final   Organism ID, Bacteria PSEUDOMONAS AERUGINOSA  Final      Susceptibility   Pseudomonas aeruginosa - MIC*    CEFTAZIDIME 4 SENSITIVE Sensitive     CIPROFLOXACIN <=0.25 SENSITIVE Sensitive     GENTAMICIN 4 SENSITIVE Sensitive     IMIPENEM 2 SENSITIVE Sensitive     PIP/TAZO <=4 SENSITIVE Sensitive ug/mL    CEFEPIME 2 SENSITIVE Sensitive     * RARE PSEUDOMONAS AERUGINOSA  Surgical pcr screen     Status: Abnormal   Collection Time: 06/02/24  7:55 PM   Specimen: Nasal Mucosa; Nasal Swab  Result Value Ref Range Status   MRSA, PCR POSITIVE (A) NEGATIVE Final   Staphylococcus aureus POSITIVE (A) NEGATIVE Final    Comment: (NOTE) The Xpert SA Assay (FDA approved for NASAL specimens in patients 41 years of age and older), is one component of a comprehensive surveillance program. It is not intended to diagnose infection nor to guide or monitor treatment. Performed at [facility], [address], [place], [place] [address]     Labs: Results for orders placed or performed during the hospital encounter of 05/08/24 (from the past 48 hours)  Glucose, capillary     Status: Abnormal   Collection Time: 06/07/24  1:07 PM  Result Value Ref Range   Glucose-Capillary 152 (H) 70 - 99 mg/dL    Comment: Glucose reference range applies only to samples taken after fasting for at least 8 hours.  Glucose, capillary     Status: Abnormal   Collection Time: 06/07/24 11:58 PM  Result Value Ref Range   Glucose-Capillary 126 (H) 70 - 99 mg/dL    Comment: Glucose reference range applies only to samples taken after fasting for at least 8 hours.  CBC     Status: Abnormal   Collection Time: 06/08/24  4:06 AM  Result Value Ref Range   WBC 10.3 4.0 - 10.5 K/uL   RBC 2.81 (L) 3.87 - 5.11 MIL/uL   Hemoglobin 8.0 (L) 12.0 - 15.0 g/dL   HCT 73.9 (L) 63.9 - 53.9 %   MCV 92.5  80.0 - 100.0 fL   MCH 28.5 26.0 - 34.0 pg   MCHC 30.8 30.0 - 36.0 g/dL   RDW 83.1 (H) 88.4 - 84.4 %   Platelets 319 150 - 400 K/uL   nRBC 0.3 (H) 0.0 - 0.2 %    Comment: Performed at [facility], [address], [place], [place] [address]  Heparin  level (unfractionated)     Status: None   Collection Time: 06/08/24  4:06 AM  Result Value Ref Range   Heparin  Unfractionated 0.39 0.30 - 0.70 IU/mL    Comment: (NOTE) The clinical reportable range upper limit is being lowered to >1.10 to align with the FDA approved guidance for the current laboratory assay.  If heparin  results are below expected values, and patient dosage has  been confirmed, suggest follow up testing of antithrombin III levels. Performed at [facility], [address] [place], [place] [address]   Basic metabolic panel with GFR     Status: Abnormal   Collection Time: 06/08/24  4:06 AM  Result Value Ref Range   Sodium 133 (L) 135 - 145 mmol/L   Potassium 3.4 (L) 3.5 - 5.1 mmol/L   Chloride 104 98  - 111 mmol/L   CO2 22 22 - 32 mmol/L   Glucose, Bld 124 (H) 70 - 99 mg/dL    Comment: Glucose reference range applies only to samples taken after fasting for at least 8 hours.   BUN 23 8 - 23 mg/dL   Creatinine, Ser 9.56 (L) 0.44 - 1.00 mg/dL   Calcium  7.6 (L) 8.9 - 10.3 mg/dL   GFR, Estimated >39 >39 mL/min    Comment: (NOTE) Calculated using the CKD-EPI Creatinine Equation (2021)    Anion gap 7 5 - 15    Comment: Performed at [facility], [address] [place], [place] [address]  Glucose, capillary     Status: Abnormal   Collection Time: 06/08/24 11:05 AM  Result Value Ref Range   Glucose-Capillary 129 (H) 70 - 99 mg/dL    Comment: Glucose reference range applies only to samples taken after fasting for at least 8 hours.  Glucose, capillary     Status: Abnormal   Collection Time: 06/08/24  4:02 PM  Result Value Ref Range   Glucose-Capillary 129 (H) 70 - 99 mg/dL    Comment: Glucose reference range applies only to samples taken after fasting for at least 8 hours.  Glucose, capillary     Status: Abnormal   Collection Time: 06/08/24  7:56 PM  Result Value Ref Range   Glucose-Capillary 140 (H) 70 - 99 mg/dL    Comment: Glucose reference range applies only to samples taken after fasting for at least 8 hours.  Glucose, capillary     Status: Abnormal   Collection Time: 06/08/24 11:59 PM  Result Value Ref Range   Glucose-Capillary 109 (H) 70 - 99 mg/dL    Comment: Glucose reference range applies only to samples taken after fasting for at least 8 hours.  Comprehensive metabolic panel     Status: Abnormal   Collection Time: 06/09/24  4:06 AM  Result Value Ref Range   Sodium 134 (L) 135 - 145 mmol/L   Potassium 3.9 3.5 - 5.1 mmol/L   Chloride 105 98 - 111 mmol/L   CO2 22 22 - 32 mmol/L   Glucose, Bld 128 (H) 70 - 99 mg/dL    Comment: Glucose reference range applies only to samples taken after fasting for  at least 8 hours.   BUN 23 8 - 23 mg/dL   Creatinine, Ser  9.53 0.44 - 1.00 mg/dL   Calcium  7.7 (L) 8.9 - 10.3 mg/dL   Total Protein 6.1 (L) 6.5 - 8.1 g/dL   Albumin  1.5 (L) 3.5 - 5.0 g/dL   AST 22 15 - 41 U/L   ALT 21 0 - 44 U/L   Alkaline Phosphatase 196 (H) 38 - 126 U/L   Total Bilirubin 1.4 (H) 0.0 - 1.2 mg/dL   GFR, Estimated >39 >39 mL/min    Comment: (NOTE) Calculated using the CKD-EPI Creatinine Equation (2021)    Anion gap 7 5 - 15    Comment: Performed at [facility], [address] [place], [place] [address]  Magnesium      Status: None   Collection Time: 06/09/24  4:06 AM  Result Value Ref Range   Magnesium  1.8 1.7 - 2.4 mg/dL    Comment: Performed at [facility], [address], [place], [place] [id number]  Phosphorus     Status: None   Collection Time: 06/09/24  4:06 AM  Result Value Ref Range   Phosphorus 2.7 2.5 - 4.6 mg/dL    Comment: Performed at [facility], [address], [place], [place] [address]  Triglycerides     Status: Abnormal   Collection Time: 06/09/24  4:06 AM  Result Value Ref Range   Triglycerides 205 (H) <150 mg/dL    Comment: Performed at [facility], [address] [place], [place] [address]  CBC     Status: Abnormal   Collection Time: 06/09/24  4:06 AM  Result Value Ref Range   WBC 10.8 (H) 4.0 - 10.5 K/uL   RBC 2.65 (L) 3.87 - 5.11 MIL/uL   Hemoglobin 7.4 (L) 12.0 - 15.0 g/dL   HCT 75.5 (L) 63.9 - 53.9 %   MCV 92.1 80.0 - 100.0 fL   MCH 27.9 26.0 - 34.0 pg   MCHC 30.3 30.0 - 36.0 g/dL   RDW 82.0 (H) 88.4 - 84.4 %   Platelets 351 150 - 400 K/uL   nRBC 0.3 (H) 0.0 - 0.2 %    Comment: Performed at [facility], [address], [place], [place] [address]  Heparin  level (unfractionated)     Status: None   Collection Time: 06/09/24  4:06 AM  Result Value Ref Range   Heparin  Unfractionated 0.36 0.30 - 0.70 IU/mL    Comment: (NOTE) The clinical reportable range upper limit is being lowered to  >1.10 to align with the FDA approved guidance for the current laboratory assay.  If heparin  results are below expected values, and patient dosage has  been confirmed, suggest follow up testing of antithrombin III levels. Performed at [facility], [address] [place], [place] [address]     Imaging / Studies: No results found.      Medications / Allergies: per chart  Antibiotics: Anti-infectives (From admission, onward)    Start     Dose/Rate Route Frequency Ordered Stop   06/06/24 1000  micafungin  (MYCAMINE ) 100 mg in sodium chloride  0.9 % 100 mL IVPB        100 mg 105 mL/hr over 1 Hours Intravenous Every 24 hours 06/05/24 1349 06/20/24 0959   06/04/24 1400  piperacillin -tazobactam (ZOSYN ) IVPB 3.375 g        3.375 g 12.5 mL/hr over 240 Minutes Intravenous Every 8 hours 06/04/24 0738 06/09/24 1359  06/04/24 1400  DAPTOmycin  (CUBICIN ) IVPB 500 mg/34mL premix        8 mg/kg  60.9 kg 100 mL/hr over 30 Minutes Intravenous Daily 06/04/24 0738 06/08/24 2020   06/03/24 1000  neomycin  (MYCIFRADIN ) tablet 500 mg  Status:  Discontinued        500 mg Oral 3 times daily 06/03/24 0935 06/03/24 1555   06/03/24 1000  metroNIDAZOLE  (FLAGYL ) tablet 500 mg  Status:  Discontinued       Note to Pharmacy: Take 2 pills (=1000mg ) by mouth at 1pm, 3pm, and 10pm the day before your colorectal operation   500 mg Oral 3 times daily 06/03/24 0935 06/03/24 1555   05/29/24 1500  DAPTOmycin  (CUBICIN ) IVPB 500 mg/50mL premix  Status:  Discontinued        8 mg/kg  60.9 kg 100 mL/hr over 30 Minutes Intravenous Daily 05/29/24 1359 06/04/24 0738   05/28/24 2100  vancomycin  (VANCOREADY) IVPB 750 mg/150 mL  Status:  Discontinued        750 mg 150 mL/hr over 60 Minutes Intravenous Every 12 hours 05/28/24 1419 05/29/24 1359   05/27/24 0800  vancomycin  (VANCOCIN ) IVPB 1000 mg/200 mL premix  Status:  Discontinued        1,000 mg 200 mL/hr over 60 Minutes Intravenous Every 24 hours  05/26/24 1144 05/28/24 1419   05/23/24 1400  piperacillin -tazobactam (ZOSYN ) IVPB 3.375 g  Status:  Discontinued        3.375 g 12.5 mL/hr over 240 Minutes Intravenous Every 8 hours 05/23/24 0804 06/04/24 0738   05/23/24 1200  micafungin  (MYCAMINE ) 100 mg in sodium chloride  0.9 % 100 mL IVPB        100 mg 105 mL/hr over 1 Hours Intravenous Every 24 hours 05/23/24 0755 06/05/24 1221   05/22/24 0800  vancomycin  (VANCOCIN ) IVPB 1000 mg/200 mL premix  Status:  Discontinued        1,000 mg 200 mL/hr over 60 Minutes Intravenous Every 24 hours 05/21/24 0634 05/26/24 0806   05/21/24 0730  vancomycin  (VANCOREADY) IVPB 1250 mg/250 mL        1,250 mg 166.7 mL/hr over 90 Minutes Intravenous  Once 05/21/24 0634 05/21/24 1043   05/19/24 1400  piperacillin -tazobactam (ZOSYN ) IVPB 3.375 g  Status:  Discontinued        3.375 g 12.5 mL/hr over 240 Minutes Intravenous Every 8 hours 05/19/24 1303 05/23/24 0804   05/09/24 0900  erythromycin  250 mg in sodium chloride  0.9 % 100 mL IVPB        250 mg 100 mL/hr over 60 Minutes Intravenous Every 8 hours 05/09/24 0732 05/11/24 0044   05/08/24 2200  cefoTEtan  (CEFOTAN ) 2 g in sodium chloride  0.9 % 100 mL IVPB        2 g 200 mL/hr over 30 Minutes Intravenous Every 12 hours 05/08/24 1759 05/09/24 0749   05/08/24 1400  neomycin  (MYCIFRADIN ) tablet 1,000 mg  Status:  Discontinued       Placed in And Linked Group   1,000 mg Oral 3 times per day 05/08/24 1119 05/08/24 1120   05/08/24 1400  metroNIDAZOLE  (FLAGYL ) tablet 1,000 mg  Status:  Discontinued       Placed in And Linked Group   1,000 mg Oral 3 times per day 05/08/24 1119 05/08/24 1120   05/08/24 1130  cefoTEtan  (CEFOTAN ) 2 g in sodium chloride  0.9 % 100 mL IVPB        2 g 200 mL/hr over 30 Minutes Intravenous On call to O.R. 05/08/24  1119 05/09/24 0749         Note: Portions of this report may have been transcribed using voice recognition software. Every effort was made to ensure accuracy; however,  inadvertent computerized transcription errors may be present.   Any transcriptional errors that result from this process are unintentional.    Elspeth KYM Schultze, MD, FACS, MASCRS Esophageal, Gastrointestinal & Colorectal Surgery Robotic and Minimally Invasive Surgery  [facility] [address], [address] [place], [place] [address] [phone] Fax [phone] Main  CONTACT INFORMATION: Weekday (9AM-5PM): Call [facility] main office at [phone] Weeknight (5PM-9AM) or Weekend/Holiday: Check EPIC Web Links tab & use AMION (password  [email or web address]) for General Surgery [facility] coverage  Please, DO NOT use SecureChat  (it is not reliable communication to reach operating surgeons & will lead to a delay in care).   Epic staff messaging available for outptient concerns needing 1-2 business day response.      06/09/2024  7:22 AM

## 2024-06-09 NOTE — Consult Note (Addendum)
 WOC Nurse ostomy follow up Stoma type/location: RLQ colostomy Stomal assessment/size: 32 mm round. Red and viable. Partial dead tissue on the suture. Peristomal assessment: intact. Treatment options for stomal/peristomal skin: ring [id number]. Output aprox. 80 ml on the bag at 1220. Soft brown stools. Ostomy pouching: 1pc. #[id number].  Education provided: No family present. The pt does no follow the instructions about ostomy care.  She must to be independent if she intents to go home.  Enrolled patient in [place] Secure Start Discharge program: Yes (06/26)  WOC Nurse wound follow up Wound type: Surgical. Midline abd. Measurement: 16 x 1 x 0.5 cm. Wound bed: 100% red. Drainage (amount, consistency, odor) Scant amount. No odor, serosanguinous. Periwound: intact Dressing procedure/placement/frequency: Removed old NPWT dressing Cleansed wound with normal saline Periwound skin protected with skin barrier wipe or window framed with drape Skin protected to the hip with VAC drape for foam bridge  Filled wound with 1 piece of black foam  Sealed NPWT dressing at HG/1102mmHG  Patient received IV pain medication per bedside nurse prior to dressing change Patient tolerated procedure well  WOC nurse will continue to provide NPWT dressing changed due to the complexity of the dressing change.     WOC team will follow THURS.   Please reconsult if further assistance is needed. Thank-you,  Lela Holm BSN, RN, [employer], WOC  (Pager: [phone])

## 2024-06-09 NOTE — TOC Progression Note (Signed)
 Transition of Care [facility]) - Progression Note    Patient Details  Name: SHARMANE DAME MRN: [id number] Date of Birth: Nov 24, 1941  [facility]) CM/SW Contact  Tawni CHRISTELLA Eva, LCSW Phone Number: 06/09/2024, 3:15 PM  Clinical Narrative:     Pt not medically stable , still requiring TPN. TOC to follow.    Expected Discharge Plan: (P) Skilled Nursing Facility Barriers to Discharge: (P) Continued Medical Work up, SNF Pending bed offer, Conservator, museum/gallery and Services   Discharge Planning Services: CM Consult   Living arrangements for the past 2 months: Apartment                                       Social Determinants of Health (SDOH) Interventions SDOH Screenings   Food Insecurity: No Food Insecurity (05/09/2024)  Housing: Low Risk  (05/09/2024)  Transportation Needs: No Transportation Needs (05/09/2024)  Utilities: Not At Risk (05/09/2024)  Depression (PHQ2-9): Low Risk  (01/02/2024)  Social Connections: Unknown (05/09/2024)  Tobacco Use: Low Risk  (05/08/2024)    Readmission Risk Interventions    05/12/2024    2:23 PM 05/09/2024    2:46 PM 10/19/2023    6:13 PM  Readmission Risk Prevention Plan  Transportation Screening Complete Complete Complete  PCP or Specialist Appt within 5-7 Days   Complete  PCP or Specialist Appt within 3-5 Days Complete Complete   Home Care Screening   Complete  Medication Review (RN CM)   Complete  HRI or Home Care Consult Complete Complete   Social Work Consult for Recovery Care Planning/Counseling Complete Complete   Palliative Care Screening Not Applicable Not Applicable   Medication Review Oceanographer) Complete Complete

## 2024-06-09 NOTE — Plan of Care (Signed)
  Problem: Education: Goal: Verbalization of understanding of the causes of altered bowel function will improve Outcome: Progressing   Problem: Bowel/Gastric: Goal: Gastrointestinal status for postoperative course will improve Outcome: Progressing   Problem: Health Behavior/Discharge Planning: Goal: Identification of community resources to assist with postoperative recovery needs will improve Outcome: Progressing   Problem: Nutritional: Goal: Will attain and maintain optimal nutritional status will improve Outcome: Progressing   Problem: Clinical Measurements: Goal: Postoperative complications will be avoided or minimized Outcome: Progressing   Problem: Respiratory: Goal: Respiratory status will improve Outcome: Progressing   Problem: Skin Integrity: Goal: Will show signs of wound healing Outcome: Progressing   Problem: Education: Goal: Knowledge of General Education information will improve Description: Including pain rating scale, medication(s)/side effects and non-pharmacologic comfort measures Outcome: Progressing   Problem: Health Behavior/Discharge Planning: Goal: Ability to manage health-related needs will improve Outcome: Progressing   Problem: Clinical Measurements: Goal: Ability to maintain clinical measurements within normal limits will improve Outcome: Progressing Goal: Will remain free from infection Outcome: Progressing Goal: Diagnostic test results will improve Outcome: Progressing Goal: Respiratory complications will improve Outcome: Progressing Goal: Cardiovascular complication will be avoided Outcome: Progressing   Problem: Activity: Goal: Risk for activity intolerance will decrease Outcome: Progressing   Problem: Coping: Goal: Level of anxiety will decrease Outcome: Progressing   Problem: Elimination: Goal: Will not experience complications related to bowel motility Outcome: Progressing Goal: Will not experience complications related to  urinary retention Outcome: Progressing   Problem: Pain Managment: Goal: General experience of comfort will improve and/or be controlled Outcome: Progressing   Problem: Safety: Goal: Ability to remain free from injury will improve Outcome: Progressing   Problem: Skin Integrity: Goal: Risk for impaired skin integrity will decrease Outcome: Progressing   Problem: Education: Goal: Ability to describe self-care measures that may prevent or decrease complications (Diabetes Survival Skills Education) will improve Outcome: Progressing Goal: Individualized Educational Video(s) Outcome: Progressing   Problem: Coping: Goal: Ability to adjust to condition or change in health will improve Outcome: Progressing   Problem: Fluid Volume: Goal: Ability to maintain a balanced intake and output will improve Outcome: Progressing   Problem: Health Behavior/Discharge Planning: Goal: Ability to identify and utilize available resources and services will improve Outcome: Progressing Goal: Ability to manage health-related needs will improve Outcome: Progressing   Problem: Metabolic: Goal: Ability to maintain appropriate glucose levels will improve Outcome: Progressing   Problem: Nutritional: Goal: Maintenance of adequate nutrition will improve Outcome: Progressing Goal: Progress toward achieving an optimal weight will improve Outcome: Progressing   Problem: Skin Integrity: Goal: Risk for impaired skin integrity will decrease Outcome: Progressing   Problem: Tissue Perfusion: Goal: Adequacy of tissue perfusion will improve Outcome: Progressing   Problem: Activity: Goal: Ability to tolerate increased activity will improve Outcome: Progressing   Problem: Respiratory: Goal: Ability to maintain a clear airway and adequate ventilation will improve Outcome: Progressing   Problem: Role Relationship: Goal: Method of communication will improve Outcome: Progressing

## 2024-06-09 NOTE — Progress Notes (Signed)
 Nutrition Follow-up  DOCUMENTATION CODES:   Severe malnutrition in context of chronic illness  INTERVENTION:   -Per G-tube: Initiate Osmolite 1.5 @ 30 ml/hr, advance by 10 ml every 4-6 hours to goal rate of 50 ml/hr. -Provides 1800 kcals, 75g protein and 914 ml H2O   -TPN per Pharmacy  -Daily weights while on nutrition support  NUTRITION DIAGNOSIS:   Severe Malnutrition related to chronic illness as evidenced by severe fat depletion, severe muscle depletion.  Ongoing.  GOAL:   Patient will meet greater than or equal to 90% of their needs  Meeting with TPN  MONITOR:   Diet advancement, Labs, Weight trends  REASON FOR ASSESSMENT:   Consult Enteral/tube feeding initiation and management  ASSESSMENT:   83 year old female with PMH HTN, mild cognitive impairment, asd large bowel obstruction (October 2024) s/p Hartman's resection and colostomy creation who was admitted 5/29 for colostomy reversal.  5/29 Admit; s/p colostomy reversal, small bowel resection, and extensive lysis of adhesions; CLD 5/30 Soft diet -> FLD 6/2 NPO; NGT placed; TPN to be initiated 6/3 CLD 6/4-6/5 Dysphagia 1 -> Soft-> Reg diet, Calorie Count #1 completed, consumed 11-14% of needs 6/6 Soft diet 6/7-6/8 Calorie Count #2 completed, consumed 10-22% of needs 6/9 Heart Healthy -> NPO 6/10 s/p drain placement 6/11 CLD 6/12 FLD -> Regular diet -> soft diet 6/13-6/15 Regular diet, Calorie Count #3 -consumed 1-4% of needs 6/16 Heart Healthy diet 6/17 NPO ->Regular diet 6/19-6/20 Calorie Count #4 -pt consumed 0-10% of needs 6/24 TPN increased back to goal; NPO; s/p G-tube placement, diverting loop colostomy, drain placement, returned to ICU intubated 6/25 Extubated 6/26 Trickle feeds to be started via G-tube; SLP eval -> ice chips prn  Surgery consulted RD for tube feeding initiation and management. Starting at 30 ml/hr and advancing to goal of 50. Will monitor for tolerance.   TPN continues at 70  ml/hr, providing 1680 kcals and 94g protein.  Medications: Vitamin C , Ferrous sulfate, Lasix , KLOR-CON   Labs reviewed: CBGs: 109-140 Low Na TG 205  Diet Order:   Diet Order             DIET - DYS 1 Room service appropriate? Yes; Fluid consistency: Thin  Diet effective now           Diet - low sodium heart healthy                   EDUCATION NEEDS:   Education needs have been addressed  Skin:  Skin Assessment: Skin Integrity Issues: Skin Integrity Issues:: Stage II, Incisions Stage II: Right Buttocks Incisions: Abdomen  Last BM:  6/30 -colostomy  Height:   Ht Readings from Last 1 Encounters:  05/09/24 5' 5 (1.651 m)    Weight:   Wt Readings from Last 1 Encounters:  06/09/24 66.9 kg    BMI:  Body mass index is 24.54 kg/m.  Estimated Nutritional Needs:   Kcal:  1600-1750 kcals  Protein:  80-95 grams  Fluid:  >/= 1.6L   Elizabeth Lee, MS, RD, LDN Inpatient Clinical Dietitian Contact via Secure chat

## 2024-06-10 ENCOUNTER — Inpatient Hospital Stay (HOSPITAL_COMMUNITY)

## 2024-06-10 DIAGNOSIS — Z515 Encounter for palliative care: Secondary | ICD-10-CM | POA: Diagnosis not present

## 2024-06-10 DIAGNOSIS — K56699 Other intestinal obstruction unspecified as to partial versus complete obstruction: Secondary | ICD-10-CM | POA: Diagnosis not present

## 2024-06-10 DIAGNOSIS — K5790 Diverticulosis of intestine, part unspecified, without perforation or abscess without bleeding: Secondary | ICD-10-CM | POA: Diagnosis not present

## 2024-06-10 DIAGNOSIS — R4589 Other symptoms and signs involving emotional state: Secondary | ICD-10-CM | POA: Diagnosis not present

## 2024-06-10 DIAGNOSIS — Z7189 Other specified counseling: Secondary | ICD-10-CM

## 2024-06-10 DIAGNOSIS — E43 Unspecified severe protein-calorie malnutrition: Secondary | ICD-10-CM | POA: Diagnosis not present

## 2024-06-10 DIAGNOSIS — Z931 Gastrostomy status: Secondary | ICD-10-CM

## 2024-06-10 LAB — HEPARIN LEVEL (UNFRACTIONATED): Heparin Unfractionated: 0.47 [IU]/mL (ref 0.30–0.70)

## 2024-06-10 LAB — BASIC METABOLIC PANEL WITH GFR
Anion gap: 7 (ref 5–15)
BUN: 29 mg/dL — ABNORMAL HIGH (ref 8–23)
CO2: 23 mmol/L (ref 22–32)
Calcium: 7.9 mg/dL — ABNORMAL LOW (ref 8.9–10.3)
Chloride: 108 mmol/L (ref 98–111)
Creatinine, Ser: 0.4 mg/dL — ABNORMAL LOW (ref 0.44–1.00)
GFR, Estimated: >60 mL/min (ref >60)
Glucose, Bld: 161 mg/dL — ABNORMAL HIGH (ref 70–99)
Potassium: 3.6 mmol/L (ref 3.5–5.1)
Sodium: 138 mmol/L (ref 135–145)

## 2024-06-10 LAB — GLUCOSE, CAPILLARY
Glucose-Capillary: 117 mg/dL — ABNORMAL HIGH (ref 70–99)
Glucose-Capillary: 137 mg/dL — ABNORMAL HIGH (ref 70–99)
Glucose-Capillary: 148 mg/dL — ABNORMAL HIGH (ref 70–99)
Glucose-Capillary: 151 mg/dL — ABNORMAL HIGH (ref 70–99)
Glucose-Capillary: 153 mg/dL — ABNORMAL HIGH (ref 70–99)
Glucose-Capillary: 161 mg/dL — ABNORMAL HIGH (ref 70–99)

## 2024-06-10 MED ORDER — AMLODIPINE BESYLATE 10 MG PO TABS
5.0000 mg | ORAL_TABLET | Freq: Every day | ORAL | Status: DC
  Administered 2024-06-10 – 2024-06-13 (×4): 5 mg via ORAL
  Filled 2024-06-10 (×4): qty 1

## 2024-06-10 MED ORDER — HYDROMORPHONE HCL 1 MG/ML IJ SOLN
0.5000 mg | INTRAMUSCULAR | Status: DC | PRN
  Administered 2024-06-10 – 2024-06-13 (×7): 0.5 mg via INTRAVENOUS
  Filled 2024-06-10 (×7): qty 0.5

## 2024-06-10 MED ORDER — IOHEXOL 300 MG/ML  SOLN
100.0000 mL | Freq: Once | INTRAMUSCULAR | Status: AC | PRN
  Administered 2024-06-10: 100 mL via INTRAVENOUS

## 2024-06-10 MED ORDER — FUROSEMIDE 10 MG/ML IJ SOLN: 40.0000 mg | Freq: Once | INTRAMUSCULAR | Status: DC

## 2024-06-10 MED ORDER — DIPHENHYDRAMINE HCL 50 MG/ML IJ SOLN
12.5000 mg | Freq: Three times a day (TID) | INTRAMUSCULAR | Status: DC | PRN
  Administered 2024-06-10 – 2024-06-14 (×7): 12.5 mg via INTRAVENOUS
  Filled 2024-06-10 (×8): qty 1

## 2024-06-10 MED ORDER — OXYCODONE HCL 5 MG PO TABS
5.0000 mg | ORAL_TABLET | ORAL | Status: DC | PRN
  Administered 2024-06-10 – 2024-06-11 (×5): 10 mg
  Administered 2024-06-12 (×2): 5 mg
  Administered 2024-06-13 – 2024-06-16 (×5): 10 mg
  Administered 2024-06-17 (×2): 5 mg
  Administered 2024-06-17 – 2024-06-18 (×2): 10 mg
  Administered 2024-06-19: 5 mg
  Administered 2024-06-20 – 2024-06-23 (×2): 10 mg
  Filled 2024-06-10 (×3): qty 2
  Filled 2024-06-10: qty 1
  Filled 2024-06-10 (×4): qty 2
  Filled 2024-06-10: qty 1
  Filled 2024-06-10 (×3): qty 2
  Filled 2024-06-10: qty 1
  Filled 2024-06-10 (×5): qty 2
  Filled 2024-06-10 (×2): qty 1

## 2024-06-10 MED ORDER — OXYCODONE HCL 5 MG PO TABS
5.0000 mg | ORAL_TABLET | Freq: Every evening | ORAL | Status: DC | PRN
  Administered 2024-06-11 – 2024-06-12 (×4): 5 mg via ORAL
  Filled 2024-06-10 (×4): qty 1

## 2024-06-10 MED ORDER — OSMOLITE 1.5 CAL PO LIQD
1000.0000 mL | ORAL | Status: DC
  Administered 2024-06-10: 1000 mL
  Filled 2024-06-10: qty 1000

## 2024-06-10 MED ORDER — OSMOLITE 1.5 CAL PO LIQD
237.0000 mL | ORAL | Status: AC
  Administered 2024-06-10: 237 mL
  Filled 2024-06-10 (×5): qty 237

## 2024-06-10 MED ORDER — FUROSEMIDE 10 MG/ML IJ SOLN
40.0000 mg | Freq: Once | INTRAMUSCULAR | Status: AC
  Administered 2024-06-10: 40 mg via INTRAVENOUS
  Filled 2024-06-10: qty 4

## 2024-06-10 MED ORDER — OSMOLITE 1.5 CAL PO LIQD
1200.0000 mL | ORAL | Status: DC
  Filled 2024-06-10: qty 1422

## 2024-06-10 MED ORDER — OSMOLITE 1.5 CAL PO LIQD
1000.0000 mL | ORAL | Status: AC
  Administered 2024-06-10: 1000 mL
  Filled 2024-06-10 (×2): qty 1000

## 2024-06-10 NOTE — Progress Notes (Signed)
 CT scan of abdomen pelvis done.  Pelvis markedly improved and decompressed.  There are no new interloop fluid collections.  HOWEVER, In the left upper quadrant around her stomach are some fluid collections that seem to have some sediment.    There is contrast in her stomach and it does not leak to that.  I see no extravasation of contrast.    I suspect these are postoperative hematomas from her surgery since she was back on her IV heparin  postop and she dropped her hemoglobin, needing transfusion.  Will ask interventional radiology to place a drain to make sure these are not abscesses or other concerns.  If these are postoperative hematomas and nothing grows on cultures, stop all antibiotics and regroup.   I was able to reach the patient's granddaughter and go over findings and recommendations.  Transitioning off TPN, doing tube feeds, chasing postop fluid collections, stopping telemetry, hopefully get her in the Foley catheter later this week; and most importantly working with physical therapy and rehab.  Granddaughter noted that her grandmother, the patient, did actually get up into a chair this weekend which is a guardedly hopeful sign.  Patient standing a little bit with PT as well  Elspeth KYM Schultze, MD, FACS, MASCRS Esophageal, Gastrointestinal & Colorectal Surgery Robotic and Minimally Invasive Surgery  [facility] [address] [place], [place] [address] [phone] Fax [phone] Main  CONTACT INFORMATION: Weekday (9AM-5PM): Call [facility] main office at [phone] Weeknight (5PM-9AM) or Weekend/Holiday: Check EPIC Web Links tab & use AMION (password  [email or web address]) for General Surgery [facility] coverage  Please, DO NOT use SecureChat  (it is not reliable communication to reach operating surgeons & will lead to a delay in care).   Epic staff messaging available for outptient concerns needing 1-2 business day response.

## 2024-06-10 NOTE — Progress Notes (Signed)
 Nutrition Follow-up  DOCUMENTATION CODES:   Severe malnutrition in context of chronic illness  INTERVENTION:   -Tube feeding per surgery: Osmolite 1.5 @ 75 ml/hr x 16 hours (1600-0800) via G-tube -Provides 1800 kcals, 75g protein and 914 ml H2O  Bolus gravity recommendations: -5 cartons of Osmolite 1.5 daily -Free water  flushes: 60 ml before and after each feed -Provides 1775 kcals, 74g protein and 1505 ml H2O  NUTRITION DIAGNOSIS:   Severe Malnutrition related to chronic illness as evidenced by severe fat depletion, severe muscle depletion.  Ongoing.  GOAL:   Patient will meet greater than or equal to 90% of their needs  Progressing.  MONITOR:   Diet advancement, Labs, Weight trends TF tolerance  ASSESSMENT:   83 year old female with PMH HTN, mild cognitive impairment, asd large bowel obstruction (October 2024) s/p Hartman's resection and colostomy creation who was admitted 5/29 for colostomy reversal.  5/29 Admit; s/p colostomy reversal, small bowel resection, and extensive lysis of adhesions; CLD 5/30 Soft diet -> FLD 6/2 NPO; NGT placed; TPN to be initiated 6/3 CLD 6/4-6/5 Dysphagia 1 -> Soft-> Reg diet, Calorie Count #1 completed, consumed 11-14% of needs 6/6 Soft diet 6/7-6/8 Calorie Count #2 completed, consumed 10-22% of needs 6/9 Heart Healthy -> NPO 6/10 s/p drain placement 6/11 CLD 6/12 FLD -> Regular diet -> soft diet 6/13-6/15 Regular diet, Calorie Count #3 -consumed 1-4% of needs 6/16 Heart Healthy diet 6/17 NPO ->Regular diet 6/19-6/20 Calorie Count #4 -pt consumed 0-10% of needs 6/24 TPN increased back to goal; NPO; s/p G-tube placement, diverting loop colostomy, drain placement, returned to ICU intubated 6/25 Extubated 6/26 Trickle feeds to be started via G-tube; SLP eval -> ice chips prn 6/30 TF advancing to goal rate, SLP recommended dys 1 diet  Patient tolerating Osmolite 1.5 @ goal of 50 ml/hr. Surgery transitioning to night regimen at 75  ml/hr x 16 hours. Adjusted order to reflect start time at 1600. Plan is to transition to bolus gravity regimen once tolerating night feeds. Consider continuing night regimen if boluses impede her ability to eat during the day or she is unable to administer boluses.  TPN to wean off today.  Admission weight: 127 lbs Current weight: 146 lbs  Medications: Vitamin C , Ferrous sulfate, Lasix , Fibercon, KLOR-CON   Labs reviewed: CBGs:148-161   Diet Order:   Diet Order             Diet clear liquid Room service appropriate? Yes; Fluid consistency: Thin  Diet effective midnight           Diet NPO time specified Except for: Sips with Meds, Ice Chips  Diet effective 0500           DIET - DYS 1 Room service appropriate? Yes; Fluid consistency: Thin  Diet effective now           Diet - low sodium heart healthy                   EDUCATION NEEDS:   Education needs have been addressed  Skin:  Skin Assessment: Skin Integrity Issues: Skin Integrity Issues:: Stage II, Incisions Stage II: Right Buttocks Incisions: Abdomen  Last BM:  6/30 -colostomy  Height:   Ht Readings from Last 1 Encounters:  05/09/24 5' 5 (1.651 m)    Weight:   Wt Readings from Last 1 Encounters:  06/10/24 66.6 kg    BMI:  Body mass index is 24.43 kg/m.  Estimated Nutritional Needs:   Kcal:  1600-1750 kcals  Protein:  80-95 grams  Fluid:  >/= 1.6L   Morna Lee, MS, RD, LDN Inpatient Clinical Dietitian Contact via Secure chat

## 2024-06-10 NOTE — Progress Notes (Signed)
 Occupational Therapy Treatment Patient Details Name: Elizabeth Odom MRN: [id number] DOB: 1941-10-08 Today's Date: 06/10/2024   History of present illness Pt is 83 yr old female who presented on 05/08/24 for elective colostomy takedown. Course complicated by A-fib with RVR and then hypotension/rectal bleeding and syncope on 05/11/2024.  Patient  had post-op ileus needing NG tube and TPN.  Imaging on 05/19/2024 showed possible abscesses with transgluteal drain on 6/10 and CT guided drain to abdomen and pelvic abscesses yielding pus and stool.  Back to OR 6/24 for LOA, colostomy placement, drainage of abscess, and placement of g-tube with hypotension in OR requiring vasopressors, able to extubate 6/25. 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   OT comments  The pt declined to attempt out of bed activity, due to abdominal pain and malaise. She was agreeable to attempting partial rolling in bed, for which she required max assist. She was subsequently instructed on B UE and B LE ROM and therapeutic exercises needed to promote joint flexibility and muscle strengthening needed to achieve improved ADL performance, she required intermittent active assistive ROM, for notably for her B LE. Continue OT plan of care. Patient will benefit from continued inpatient follow up therapy, <3 hours/day.       If plan is discharge home, recommend the following:  A lot of help with bathing/dressing/bathroom;Assistance with cooking/housework;Direct supervision/assist for medications management;Assist for transportation;Direct supervision/assist for financial management;Help with stairs or ramp for entrance;Two people to help with walking and/or transfers   Equipment Recommendations  Other (comment) (defer to  next level of care)    Recommendations for Other Services      Precautions / Restrictions Precautions Precautions: Fall Restrictions Weight Bearing Restrictions Per Provider Order: No Other Position/Activity Restrictions: abdominal surgery and precautions       Mobility Bed Mobility Overal bed mobility: Needs Assistance Bed Mobility: Rolling Rolling: Max assist         General bed mobility comments: The pt was instructed on partial rolling to the right in bed, for which she required assist and instruction on best transfer technique and sequencing    Transfers      General transfer comment: The pt declined to attempt         ADL either performed or assessed with clinical judgement   ADL Overall ADL's : Needs assistance/impaired                   Communication Communication Communication: No apparent difficulties   Cognition Arousal: Alert Behavior During Therapy: Flat affect Cognition: Difficult to assess         Attention impairment (select first level of impairment): Divided attention      Following commands impaired: Follows one step commands with increased time (and occasional repetition of prompts)      Cueing   Cueing Techniques: Verbal cues, Tactile cues, Gestural cues  Exercises              Pertinent Vitals/ Pain       Pain Assessment Pain Assessment: 0-10 Pain Score: 8  Pain Intervention(s): Limited activity within patient's tolerance, Monitored during session, Repositioned   Progress Toward Goals  OT Goals(current goals can now be found in the care plan section)     Acute Rehab OT Goals Patient Stated Goal: pt did not  specifically state OT Goal Formulation: With patient Time For Goal Achievement: 06/18/24 Potential to Achieve Goals: Fair  Plan         AM-PAC OT 6 Clicks Daily Activity     Outcome Measure   Help from another person eating meals?: Total Help from another person taking care of personal grooming?: A  Lot Help from another person toileting, which includes using toliet, bedpan, or urinal?: Total Help from another person bathing (including washing, rinsing, drying)?: A Lot Help from another person to put on and taking off regular upper body clothing?: A Lot Help from another person to put on and taking off regular lower body clothing?: Total 6 Click Score: 9    End of Session Equipment Utilized During Treatment: Oxygen  OT Visit Diagnosis: Pain;Muscle weakness (generalized) (M62.81);Other abnormalities of gait and mobility (R26.89) Pain - part of body:  (abdomen)   Activity Tolerance Patient limited by pain   Patient Left in bed;with call bell/phone within reach;with bed alarm set   Nurse Communication Other (comment)        Time: 8558-8543 OT Time Calculation (min): 15 min  Charges: OT General Charges $OT Visit: 1 Visit OT Treatments $Therapeutic Exercise: 8-22 mins     [name], OTR/L 06/10/2024, 4:01 PM

## 2024-06-10 NOTE — Progress Notes (Addendum)
 PHARMACY - ANTICOAGULATION CONSULT NOTE  Pharmacy Consult for heparin  Indication: atrial fibrillation (apixaban  on hold)  Allergies  Allergen Reactions   Elemental Sulfur Anaphylaxis    Patient Measurements: Height: 5' 5 (165.1 cm) Weight: 66.6 kg (146 lb 13.2 oz) IBW/kg (Calculated) : 57 HEPARIN  DW (KG): 61.3  Vital Signs: Temp: 98.7 F (37.1 C) (07/01 0409) Temp Source: Oral (07/01 0409) BP: 148/87 (07/01 0409) Pulse Rate: 108 (07/01 0409)  Labs: Recent Labs    06/08/24 0406 06/09/24 0406 06/10/24 0400  HGB 8.0* 7.4*  --   HCT 26.0* 24.4*  --   PLT 319 351  --   HEPARINUNFRC 0.39 0.36 0.47  CREATININE 0.43* 0.46 0.40*    Estimated Creatinine Clearance: 47.9 mL/min (A) (by C-G formula based on SCr of 0.4 mg/dL (L)).  Medications: Pt was not on anticoagulants PTA, however, apixaban  was started during admission for atrial fibrillation.  -Apixaban  2.5 mg PO BID, last dose given 6/23 @ 21:30  Assessment: Pt is an 63 yoF who was started on apixaban  during this admission for atrial fibrillation. Pt underwent surgery on 6/24 - placement of G-tube, I&D of abscess, diverting colostomy. [facility] recommending heparin  drip post-op until pt having BM.   Pharmacy consulted to dose heparin  for afib while apixaban  on hold.   06/10/2024 -Heparin  level 0.47 - remains therapeutic on 1550 units/hr -Hg 7.4 - trending down (CBC now twice weekly per [facility]) -Now tolerating tube feeds, enteral meds -Await imaging, r/o need for additional drains/procedures before converting heparin  back to Eliquis   Goal of Therapy:  Heparin  level 0.3-0.7 units/ml aPTT 66-102 seconds Monitor platelets by anticoagulation protocol: Yes   Plan:  -Continue heparin  infusion at 1550 units/hr -CBC, heparin  level daily -Follow for ability to resume apixaban    ADDENDUM 1330: -[facility] consult to hold heparin  6 hours prior to drain placement on 7/2 -Called IR - case is currently under review, not on the schedule at  this time -Defer to IR timing around holding heparin  once case reviewed and plan in place   [name], PharmD, BCPS Clinical Pharmacist 06/10/2024 9:17 AM

## 2024-06-10 NOTE — Progress Notes (Signed)
   06/10/24 1555  Spiritual Encounters  Type of Visit Initial  Care provided to: Pt not available  Referral source Other (comment) (Spiritual Consult)  Reason for visit Religious ritual  OnCall Visit No   Chaplain responded to a spiritual consult for prayer. The patient, Elizabeth Odom, was sleeping.   Carley [place] [facility]  [phone]

## 2024-06-10 NOTE — Consult Note (Signed)
 Chief Complaint: Patient was seen in consultation today for post-op abdominal fluid collections.    Referring Physician(s): Gross,Steven  Supervising Physician: Jennefer Rover  Patient Status: [facility] - In-pt  History of Present Illness: Elizabeth Odom is a 83 y.o. female  with past medical history of atrial fibrillation, anemia, cervical cancer, hypertension, fatty liver, nephrolithiasis, mild cognitive impairment, restless leg,PE/left lower extremity DVT, status post Hartman's resection and colostomy with small bowel resection for diverticular stricture/perforation/abscess on 10/01/2023.  She underwent right transgluteal approach pelvic drain placement by our team on 10/12/2023 and removed on 11/05/2023.  She is status post robotic rectosigmoid resection, takedown of end colostomy with anastomosis, resection of small intestine with anastomosis, small bowel repair, lysis of adhesions on 05/08/2024.  Follow-up CT abdomen pelvis 05/19/24 showed multiple fluid collections and she was referred to IR for drain placement(s).  On 05/20/24 she received two 10 Fr catheters into the two largest pelvic fluid collections. One of the drains became dislodged and she was seen back in IR 05/27/24 for a drain exchange.    She was taken back to the OR with Dr. Sheldon 06/03/24 due to a delayed colorectal anastomotic leak and need for fecal diversion. She underwent diagnostic laparotomy, diverting loop transverse colostomy, drainage of abdominal abscess, gastrostomy tube placement and rectal tube placement. The IR drains were removed and a new 19 Fr Blake drain from the LUQ drain running along the left paracolic gutter and into the pelvis was placed.   The patient continued to have abdominal pain and a repeat CT Abdomen/pelvis was obtained today.   CT abdomen/pelvis with contrast 06/10/24 IMPRESSION: 1. Multiple complex fluid collections within the upper abdomen as above, containing fluid-fluid levels as above.  Differential would include hematocrit levels from prior hemorrhage, versus layering material from bowel contents given prior history of bowel perforation. Continued follow-up recommended. 2. Interval diverting transverse colostomy within the right mid abdomen. No bowel obstruction or ileus. 3. Nonspecific wall thickening of the transverse colon, which may be due to decompressed state. 4. Support devices as above. 5. Moderate bilateral pleural effusions with dependent lower lobe atelectasis. 6. Small volume free fluid, most pronounced in the right upper quadrant. 7. Diffuse body wall edema.   Interventional Radiology has been asked to evaluate patient for additional drain placements. Imaging reviewed and procedure approved by Dr. Jennefer.   Past Medical History:  Diagnosis Date   A-fib (HCC) 04/09/2016   Acute head injury 04/09/2016   Anemia    Blood dyscrasia    Hx DVT / PE   Cervical cancer (HCC)    Cervical spine fracture, initial encounter 04/09/2016   Claustrophobia    extremely claustrophobic   Dupuytren disease of palm of both hands    Essential hypertension    Fatty liver    History of kidney stones    MCI (mild cognitive impairment) with memory loss    Multiple fractures of cervical spine (HCC) 04/09/2016   Restless leg    Syncope and collapse 04/09/2016    Past Surgical History:  Procedure Laterality Date   APPENDECTOMY     BIOPSY  09/29/2023   Procedure: BIOPSY;  Surgeon: Saintclair Jasper, MD;  Location: [facility];  Service: Gastroenterology;;   breast lump removal Left    CERVICAL LAMINECTOMY     COLECTOMY WITH COLOSTOMY CREATION/HARTMANN PROCEDURE N/A 10/01/2023   Procedure: COLECTOMY WITH COLOSTOMY CREATION/HARTMANN PROCEDURE;  Surgeon: Rubin Calamity, MD;  Location: [facility];  Service: General;  Laterality: N/A;   complete  hysterectomy     CYSTOSCOPY WITH INDOCYANINE GREEN  IMAGING (ICG) N/A 05/08/2024   Procedure: CYSTOSCOPY WITH INDOCYANINE GREEN  IMAGING  (ICG);  Surgeon: Shane Steffan BROCKS, MD;  Location: [facility];  Service: Urology;  Laterality: N/A;   EP IMPLANTABLE DEVICE N/A 04/10/2016   Procedure: Loop Recorder Insertion;  Surgeon: Danelle LELON [place], MD;  Location: [facility];  Service: Cardiovascular;  Laterality: N/A;   FLEXIBLE SIGMOIDOSCOPY N/A 09/29/2023   Procedure: FLEXIBLE SIGMOIDOSCOPY;  Surgeon: Saintclair Jasper, MD;  Location: [facility];  Service: Gastroenterology;  Laterality: N/A;   FLEXIBLE SIGMOIDOSCOPY N/A 05/08/2024   Procedure: SIGMOIDOSCOPY, FLEXIBLE;  Surgeon: [name], MD;  Location: [facility];  Service: General;  Laterality: N/A;   HARVEST BONE GRAFT     INCISION AND DRAINAGE ABSCESS  06/03/2024   Procedure: INCISION AND DRAINAGE, ABSCESS;  Surgeon: [name], MD;  Location: [facility];  Service: General;;  Abdominal Abscess   IR IVC FILTER PLMT / S&I /IMG GUID/MOD SED  10/05/2023   IR SINUS/FIST TUBE CHK-NON GI  10/22/2023   IR SINUS/FIST TUBE CHK-NON GI  11/05/2023   LAPAROSCOPIC DIVERTED COLOSTOMY  06/03/2024   Procedure: CREATION, COLOSTOMY, DIVERTING, LAPAROSCOPIC;  Surgeon: [name], MD;  Location: [facility];  Service: General;;  Laparoscopy converted to Open   LAPAROSCOPIC GASTROSTOMY N/A 06/03/2024   Procedure: CREATION, GASTROSTOMY, LAPAROSCOPIC;  Surgeon: [name], MD;  Location: [facility];  Service: General;  Laterality: N/A;  FEEDING GASROSTOMY TUBE   LAPAROSCOPIC LYSIS OF ADHESIONS N/A 05/08/2024   Procedure: LYSIS, ADHESIONS, LAPAROSCOPIC;  Surgeon: [name], MD;  Location: [facility];  Service: General;  Laterality: N/A;  LYSIS OF ADHESIONS   LAPAROSCOPY  06/03/2024   Procedure: LAPAROSCOPY, DIAGNOSTIC;  Surgeon: [name], MD;  Location: [facility];  Service: General;;   LAPAROTOMY N/A 06/03/2024   Procedure: LAPAROTOMY, EXPLORATORY;  Surgeon: [name], MD;  Location: [facility];  Service: General;  Laterality: N/A;   LOOP RECORDER REMOVAL N/A 07/08/2018   Procedure: LOOP RECORDER REMOVAL;  Surgeon:  B[place] Danelle LELON, MD;  Location: [facility] INVAS[facility] CV LAB;  Service: Cardiovascular;  Laterality: N/A;   XI ROBOTIC ASSISTED COLOSTOMY TAKEDOWN N/A 05/08/2024   Procedure: CLOSURE, COLOSTOMY, ROBOT-ASSISTED -ROBOTIC RECTOSIGMOID RESECTION (LAR) -TAKEDOWN OF END COLOSTOMY WITH ANASTOMOSIS -RESECTION OF SMALL INTESTINE WITH ANASTOMOSIS -SMALL BOWEL REPAIR -LYSIS OF ADHESIONS x 115 MINUTES (66% OF CASE),  -INTRAOPERATIVE ASSESSMENT OF TISSUE VASCULAR PERFUSION USING ICG (indocyanine green ) IMMUNOFLUORESCENCE,  -TRANSVERSUS ABDOMINIS PLANE (TAP) BLOCK - BILATE    Allergies: Elemental sulfur  Medications: Prior to Admission medications   Medication Sig Start Date End Date Taking? Authorizing Provider  amLODipine  (NORVASC ) 5 MG tablet Take 1 tablet (5 mg total) by mouth daily. Patient taking differently: Take 5 mg by mouth at bedtime. 04/08/24  Yes Patwardhan, Manish J, MD  ascorbic acid  (VITAMIN C ) 250 MG CHEW Chew 250 mg by mouth daily.   Yes [provider]  aspirin  EC 81 MG tablet Take 81 mg by mouth daily. Swallow whole.   Yes [provider]  cyanocobalamin  (VITAMIN B12) 1000 MCG tablet Take 1,000 mcg by mouth daily.   Yes [provider]  FIBER GUMMIES PO Take 1 capsule by mouth daily.   Yes [provider]  Multiple Vitamin (MULTIVITAMIN WITH MINERALS) TABS tablet Take 1 tablet by mouth daily. 11/10/23  Yes Tobie Yetta HERO, MD  Omega-3 Fatty Acids (OMEGA 3 500 PO) Take 500 mg by mouth daily.   Yes [provider]  OVER  THE COUNTER MEDICATION Take 650 mg by mouth daily. Total Beets supplement   Yes [provider]  traMADol  (ULTRAM ) 50 MG tablet Take 1-2 tablets (50-100 mg total) by mouth every 6 (six) hours as needed for moderate pain (pain score 4-6) or severe pain (pain score 7-10). 05/08/24  Yes [name], MD     Family History  Problem Relation Age of Onset   Other Mother        thrombosis   Cancer Sister    Diabetes Sister    Stroke  Daughter    Thyroid  disease Daughter     Social History   Socioeconomic History   Marital status: Widowed    Spouse name: Not on file   Number of children: 3   Years of education: schooling in [place]   Highest education level: Not on file  Occupational History   Not on file  Tobacco Use   Smoking status: Never   Smokeless tobacco: Never  Vaping Use   Vaping status: Never Used  Substance and Sexual Activity   Alcohol  use: Never    Alcohol /week: 0.0 standard drinks of alcohol    Drug use: Never   Sexual activity: Not Currently  Other Topics Concern   Not on file  Social History Narrative   Patient is originally from [place], [place].   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    Her emergency contact/medical decision maker is her granddaughter:  [name] [phone]).   Social Drivers of Corporate investment banker Strain: Not on file  Food Insecurity: No Food Insecurity (05/09/2024)   Hunger Vital Sign    Worried About Running Out of Food in the Last Year: Never true    Ran Out of Food in the Last Year: Never true  Transportation Needs: No Transportation Needs (05/09/2024)   PRAPARE - Administrator, Civil Service (Medical): No    Lack of Transportation (Non-Medical): No  Physical Activity: Not on file  Stress: Not on file  Social Connections: Unknown (05/09/2024)   Social Connection and Isolation Panel    Frequency of Communication with Friends and Family: Three times a week    Frequency of Social Gatherings with Friends and Family: Once a week    Attends Religious Services: 1 to 4 times per year    Active Member of [employer] or Organizations: Yes    Attends Banker Meetings: 1 to 4 times per year    Marital Status: Patient declined    Review of Systems: A 12 point ROS discussed and pertinent positives are indicated in the HPI above.  All other systems are  negative.  Review of Systems  Constitutional:  Positive for appetite change and fatigue.  Gastrointestinal:  Positive for abdominal pain.  Skin:        Diffuse pruritus over entire back   Neurological:  Positive for weakness.    Vital Signs: BP 134/68 (BP Location: Left Arm)   Pulse (!) 101   Temp 98.7 F (37.1 C)   Resp 18   Ht 5' 5 (1.651 m)   Wt 146 lb 13.2 oz (66.6 kg)   SpO2 99%   BMI 24.43 kg/m   Physical Exam Constitutional:      General: She is not in acute distress.    Appearance: She is ill-appearing.  HENT:     Mouth/Throat:     Mouth: Mucous membranes are moist.  Pharynx: Oropharynx is clear.  Pulmonary:     Effort: Pulmonary effort is normal.  Abdominal:     Tenderness: There is abdominal tenderness.     Comments: Colostomy, gastrostomy tube, midline abdominal wound (wound vac)   Skin:    General: Skin is warm and dry.     Findings: Erythema present.     Comments: Erythema over entire back    Neurological:     Mental Status: She is alert. Mental status is at baseline.     Imaging: CT ABDOMEN PELVIS W CONTRAST Result Date: 06/10/2024 CLINICAL DATA:  Abdominal pain, postop status post diverting transverse colostomy with history of anastomotic leak EXAM: CT ABDOMEN AND PELVIS WITH CONTRAST TECHNIQUE: Multidetector CT imaging of the abdomen and pelvis was performed using the standard protocol following bolus administration of intravenous contrast. RADIATION DOSE REDUCTION: This exam was performed according to the departmental dose-optimization program which includes automated exposure control, adjustment of the mA and/or kV according to patient size and/or use of iterative reconstruction technique. CONTRAST:  OMNIPAQUE  IOHEXOL  300 MG/ML  SOLN COMPARISON:  06/02/2024 FINDINGS: Lower chest: Moderate bilateral pleural effusions with dependent lower lobe atelectasis. Hepatobiliary: Gallbladder is decompressed, with no evidence of calcified gallstones or  gallbladder wall thickening. The liver is unremarkable. No biliary duct dilation. Pancreas: Unremarkable. No pancreatic ductal dilatation or surrounding inflammatory changes. Spleen: Normal in size without focal abnormality. Adrenals/Urinary Tract: Stable punctate less than 2 mm nonobstructing left renal calculus. No right-sided calculi or obstruction. Stable minimally complex right renal cyst does not require specific follow-up. The adrenals are stable. The bladder is decompressed with a Foley catheter. Stomach/Bowel: There is a percutaneous gastrostomy tube identified within the lumen of the gastric antrum. Interval colostomy involving the proximal transverse colon within the right mid abdomen. Postsurgical changes from distal colectomy, with rectal stump identified. Interval removal of the pigtail drainage catheter previously seen within the lower pelvis/rectum. No bowel obstruction or ileus. There is nonspecific wall thickening of the transverse colon given decompressed state. There are multiple complex fluid collections within the abdomen. Along the ventral margin of the stomach there are 2 collections measuring 7.2 x 6.1 cm image 22/2, and 6.8 x 6.0 cm image 33/2, resulting in mass effect upon the stomach. The more inferior collection extends inferiorly to the level of the umbilicus, with fluid-fluid levels and hyperdense material layering dependently in these collections, most prominently at the umbilicus measuring 3.5 x 4.4 cm reference image 50/2. This could reflect hematocrit level from previous hemorrhage, versus layering material from bowel contents given history of prior anastomosis breakdown. There is no evidence of contrast extravasation to suggest active hemorrhage. Vascular/Lymphatic: Aortic atherosclerosis. IVC filter again noted. No enlarged abdominal or pelvic lymph nodes. Reproductive: Status post hysterectomy. No adnexal masses. Other: In addition to the multiple fluid collections described  above, there is a small amount of free fluid throughout the abdomen and pelvis, greatest along the inferior margin of the liver. There is no free intraperitoneal gas. Indwelling surgical drain identified entering via the left mid abdomen, tip in the left lower pelvis. Musculoskeletal: Diffuse subcutaneous edema. No acute or destructive bony abnormalities. Reconstructed images demonstrate no additional findings. IMPRESSION: 1. Multiple complex fluid collections within the upper abdomen as above, containing fluid-fluid levels as above. Differential would include hematocrit levels from prior hemorrhage, versus layering material from bowel contents given prior history of bowel perforation. Continued follow-up recommended. 2. Interval diverting transverse colostomy within the right mid abdomen. No bowel obstruction or ileus.  3. Nonspecific wall thickening of the transverse colon, which may be due to decompressed state. 4. Support devices as above. 5. Moderate bilateral pleural effusions with dependent lower lobe atelectasis. 6. Small volume free fluid, most pronounced in the right upper quadrant. 7. Diffuse body wall edema. Critical Value/emergent results were called by telephone at the time of interpretation on 06/10/2024 at 10:57 am to provider [facility], who verbally acknowledged these results. Electronically Signed   By: Ozell Daring M.D.   On: 06/10/2024 11:00   DG CHEST PORT 1 VIEW Result Date: 06/03/2024 CLINICAL DATA:  Evaluation to confirm nasogastric and endotracheal tube placement. EXAM: PORTABLE CHEST 1 VIEW COMPARISON:  Chest radiograph 05/12/2024. FINDINGS: Endotracheal tube with tip overlying the trachea just below the level of the clavicular heads approximately 5 cm above the carina. Endotracheal tube with tip overlying the left upper quadrant and side hole within approximately 1 cm of the GE junction. Redemonstrated right PICC terminating in the lower SVC. Lungs are well inflated. Mild prominence  of the central pulmonary vasculature. Opacities over the medial right lung base. No pleural effusion or pneumothorax. Similar appearance of the cardiomediastinal silhouette. IVC filter partially visualized. IMPRESSION: Endotracheal tube tip just below the level of the clavicular heads. Enteric tube with side hole just below the GE junction, recommend advancement. Mild prominence of the central pulmonary vasculature without overt pulmonary edema. Electronically Signed   By: Donnice Mania M.D.   On: 06/03/2024 19:33   CT ABDOMEN PELVIS W CONTRAST Result Date: 06/02/2024 CLINICAL DATA:  Postoperative abdominal pain, pelvic abscess, anastomotic leak. Status post colostomy takedown EXAM: CT ABDOMEN AND PELVIS WITH CONTRAST TECHNIQUE: Multidetector CT imaging of the abdomen and pelvis was performed using the standard protocol following bolus administration of intravenous contrast. RADIATION DOSE REDUCTION: This exam was performed according to the departmental dose-optimization program which includes automated exposure control, adjustment of the mA and/or kV according to patient size and/or use of iterative reconstruction technique. CONTRAST:  OMNIPAQUE  IOHEXOL  300 MG/ML  SOLN COMPARISON:  05/26/2024 FINDINGS: Lower chest: Small bilateral pleural effusions are present with associated bibasilar compressive atelectasis. Mild cardiomegaly. Hepatobiliary: No focal liver abnormality is seen. No gallstones, gallbladder wall thickening, or biliary dilatation. Pancreas: Unremarkable Spleen: Unremarkable Adrenals/Urinary Tract: The adrenal glands are unremarkable. The kidneys are normal in size and position. Stable minimally complex 7.2 cm right lower pole renal cyst demonstrating a single thin partially calcified septum most in keeping with a Bosniak class 2 cyst. No follow-up imaging is recommended. Punctate nonobstructing renal calculi are seen kidneys bilaterally measuring up to 2 mm. No hydronephrosis. Gas is again  seen non dependently within bladder lumen, nonspecific, possibly related to recent catheterization. There is asymmetric bladder wall thickening involving the bladder dome anteriorly which may relate to the adjacent inflammatory process. Stomach/Bowel: Colorectal anastomoses appears widely dehiscent. Interval percutaneous drainage catheter has been placed into the rectal vault via a left transgluteal route. Moderate debris persists surrounding the drainage catheter raising the question of catheter occlusion. The dehiscent cavity appears to communicate with small bowel within the right lower quadrant (61/2. Additionally, the terminal sigmoid colon demonstrates several fistula, 1 of which communicates with a developing gas collection within the anterior pelvis (69/2) this gas collection appears to be within the area of the previously noted drainage catheter on 05/26/2024 and measures roughly 5.8 x 5.2 x 10.7 cm in dimension (67/2, 19/5). New percutaneous drainage catheter is seen within the colonic mesentery with evacuation of the previously noted loculated fluid  collection in this region. Several small adjacent loculated fluid collections are seen within the small bowel mesentery measuring to 21 mm and 8 mm (55/2) which do not clearly communicate with this drainage catheter. Additional 11 mm fluid collection within the right mid abdominal mesentery (50/2), and right lower quadrant mesentery measuring 2.6 cm (62/2). There is extensive infiltration of the mesentery in keeping with changes of peritonitis. No evidence of obstruction. No free intraperitoneal gas or fluid. Vascular/Lymphatic: Moderate aortoiliac atherosclerotic calcification. Inferior vena cava filter noted within the infrarenal cava. The abdominal vasculature is otherwise unremarkable. In particular, the superior and inferior mesenteric veins and portal vein are patent. Reproductive: Status post hysterectomy. No adnexal masses. Other: No abdominal wall  hernia. Moderate subcutaneous edema within the flanks bilaterally. Musculoskeletal: No acute bone abnormality. Osseous structures are age-appropriate IMPRESSION: 1. Interval percutaneous drainage catheter placement within the rectal vault via a left transgluteal route. Moderate debris persists surrounding the drainage catheter raising the question of catheter occlusion. 2. Status post fall rectal anastomosis with dehiscence of the anastomosis. The dehiscent cavity appears to communicate with small bowel within the right lower quadrant. Additionally, the terminal sigmoid colon demonstrates several fistulae, 1 of which communicates with a developing non dependent gas collection within the anterior pelvis measuring roughly 5.8 x 5.2 x 10.7 cm in dimension. 3. New percutaneous drainage catheter within the colonic mesentery with evacuation of the previously noted loculated fluid collection in this region. Multiple additional small loculated fluid collections are seen within the small bowel mesentery measuring up to 21 mm are Jason 2 but do not communicate with this collection. 4. Additional small loculated fluid collections within the small bowel mesentery, as described above, without clear percutaneous route for access. 5. Extensive infiltration of the mesentery in keeping with changes of peritonitis. 6. Anasarca with small bilateral pleural effusions and subcutaneous body wall edema. Aortic Atherosclerosis (ICD10-I70.0). Electronically Signed   By: Dorethia Molt M.D.   On: 06/02/2024 20:18   CT IMAGE GUIDE DRAIN TRANSVAG TRANSRECT PERITONEAL RETROPER Result Date: 05/28/2024 INDICATION: Numerous small abdominal and pelvic abscesses in a postop patient. Previously 2 drains were placed. One drain demonstrates resolution of the abscess. The other drain has been accidentally removed. The patient needs a new drain placed in the pelvic region where the previous string was accidentally removed and a new drain placed and a  maturing midline pelvic abscess. The indwelling catheter will be removed. EXAM: CT-guided drain placement x2 and removal of indwelling drainage catheter TECHNIQUE: Multidetector CT imaging of the pelvis was performed following the standard protocol with/without IV contrast. RADIATION DOSE REDUCTION: This exam was performed according to the departmental dose-optimization program which includes automated exposure control, adjustment of the mA and/or kV according to patient size and/or use of iterative reconstruction technique. MEDICATIONS: The patient is currently admitted to the hospital and receiving intravenous antibiotics. The antibiotics were administered within an appropriate time frame prior to the initiation of the procedure. ANESTHESIA/SEDATION: Moderate (conscious) sedation was employed during this procedure. A total of Versed  2 mg and Fentanyl  100 mcg was administered intravenously by the radiology nurse. Total intra-service moderate Sedation Time: 47 minutes. The patient's level of consciousness and vital signs were monitored continuously by radiology nursing throughout the procedure under my direct supervision. COMPLICATIONS: None immediate. PROCEDURE: Informed written consent was obtained from the patient after a thorough discussion of the procedural risks, benefits and alternatives. All questions were addressed. Maximal Sterile Barrier Technique was utilized including caps, mask, sterile gowns, sterile gloves,  sterile drape, hand hygiene and skin antiseptic. A timeout was performed prior to the initiation of the procedure. With patient in a supine position on the CT gantry, radiopaque markers were placed on patient's skin. Patient is status and marked, prepped, and draped in usual sterile fashion. A 7 cm Yueh needle was then advanced through a small incision after local anesthesia was achieved with 1% lidocaine . The midline pelvic abscess was then accessed with a Yueh needle as demonstrated with  follow-up CT imaging. The needle was removed and a short Amplatz wire was then advanced through the Yueh cannula. The Lafonda was removed and the access site was dilated using a 10 [place] dilator. A 10 French pigtail drainage catheter was then advanced over the guidewire coiled in the pelvic abscess. Retention sutures sterile dressing applied. The indwelling midline drainage catheter was then removed by cutting the retention suture and then cutting the catheter and discarded. Slow gentle traction was then applied to the antral portion of the catheter into the catheter was completely removed. Sterile dressing was applied. The patient was then turned to a prone position and radiopaque markers were placed on the patient's skin. Initial axial imaging was obtained in this position. The patient was scanned was then marked, prepped, and draped in usual sterile fashion. Local anesthesia was achieved with 1% lidocaine . A 10 cm Yueh needle was then advanced into the perirectal abscess. Needle position was verified with serial CT imaging until the abscess was accessed. The needle was then removed leaving the cannula. A short Amplatz wire was then advanced and coiled within the abscess. The UB cannula was then retrieved and the access site was dilated using a 10 French fascial dilator. A 10 French drainage catheter was then advanced over the guidewire coiled within the abscess. Retention suture sterile dressings were applied. Both drainage catheters were connected to a JP bulb. Orders for flushing and care of the drainage catheters placed in the patient's chart. IMPRESSION: Satisfactory removal of the midline anterior abscess drainage catheter and placement of a new midline anterior pelvic drainage catheter as well as a posterior transgluteal drainage catheter in pelvic abscesses. Electronically Signed   By: Cordella Banner   On: 05/28/2024 10:11   CT GUIDED PERITONEAL/RETROPERITONEAL FLUID DRAIN BY PER[facility] CATH Result Date:  05/28/2024 INDICATION: Numerous small abdominal and pelvic abscesses in a postop patient. Previously 2 drains were placed. One drain demonstrates resolution of the abscess. The other drain has been accidentally removed. The patient needs a new drain placed in the pelvic region where the previous string was accidentally removed and a new drain placed and a maturing midline pelvic abscess. The indwelling catheter will be removed. EXAM: CT-guided drain placement x2 and removal of indwelling drainage catheter TECHNIQUE: Multidetector CT imaging of the pelvis was performed following the standard protocol with/without IV contrast. RADIATION DOSE REDUCTION: This exam was performed according to the departmental dose-optimization program which includes automated exposure control, adjustment of the mA and/or kV according to patient size and/or use of iterative reconstruction technique. MEDICATIONS: The patient is currently admitted to the hospital and receiving intravenous antibiotics. The antibiotics were administered within an appropriate time frame prior to the initiation of the procedure. ANESTHESIA/SEDATION: Moderate (conscious) sedation was employed during this procedure. A total of Versed  2 mg and Fentanyl  100 mcg was administered intravenously by the radiology nurse. Total intra-service moderate Sedation Time: 47 minutes. The patient's level of consciousness and vital signs were monitored continuously by radiology nursing throughout the procedure under my  direct supervision. COMPLICATIONS: None immediate. PROCEDURE: Informed written consent was obtained from the patient after a thorough discussion of the procedural risks, benefits and alternatives. All questions were addressed. Maximal Sterile Barrier Technique was utilized including caps, mask, sterile gowns, sterile gloves, sterile drape, hand hygiene and skin antiseptic. A timeout was performed prior to the initiation of the procedure. With patient in a supine  position on the CT gantry, radiopaque markers were placed on patient's skin. Patient is status and marked, prepped, and draped in usual sterile fashion. A 7 cm Yueh needle was then advanced through a small incision after local anesthesia was achieved with 1% lidocaine . The midline pelvic abscess was then accessed with a Yueh needle as demonstrated with follow-up CT imaging. The needle was removed and a short Amplatz wire was then advanced through the Yueh cannula. The [place] was removed and the access site was dilated using a 10 [place] dilator. A 10 French pigtail drainage catheter was then advanced over the guidewire coiled in the pelvic abscess. Retention sutures sterile dressing applied. The indwelling midline drainage catheter was then removed by cutting the retention suture and then cutting the catheter and discarded. Slow gentle traction was then applied to the antral portion of the catheter into the catheter was completely removed. Sterile dressing was applied. The patient was then turned to a prone position and radiopaque markers were placed on the patient's skin. Initial axial imaging was obtained in this position. The patient was scanned was then marked, prepped, and draped in usual sterile fashion. Local anesthesia was achieved with 1% lidocaine . A 10 cm Yueh needle was then advanced into the perirectal abscess. Needle position was verified with serial CT imaging until the abscess was accessed. The needle was then removed leaving the cannula. A short Amplatz wire was then advanced and coiled within the abscess. The UB cannula was then retrieved and the access site was dilated using a 10 French fascial dilator. A 10 French drainage catheter was then advanced over the guidewire coiled within the abscess. Retention suture sterile dressings were applied. Both drainage catheters were connected to a JP bulb. Orders for flushing and care of the drainage catheters placed in the patient's chart. IMPRESSION:  Satisfactory removal of the midline anterior abscess drainage catheter and placement of a new midline anterior pelvic drainage catheter as well as a posterior transgluteal drainage catheter in pelvic abscesses. Electronically Signed   By: Cordella Banner   On: 05/28/2024 10:11   CT ABDOMEN PELVIS W CONTRAST Result Date: 05/26/2024 CLINICAL DATA:  Abdominal pain, postop EXAM: CT ABDOMEN AND PELVIS WITH CONTRAST TECHNIQUE: Multidetector CT imaging of the abdomen and pelvis was performed using the standard protocol following bolus administration of intravenous contrast. RADIATION DOSE REDUCTION: This exam was performed according to the departmental dose-optimization program which includes automated exposure control, adjustment of the mA and/or kV according to patient size and/or use of iterative reconstruction technique. CONTRAST:  80mL OMNIPAQUE  IOHEXOL  300 MG/ML  SOLN COMPARISON:  None Available. FINDINGS: Lower chest: Small bilateral pleural effusions. Compressive atelectasis in the lower lobes. Hepatobiliary: No focal hepatic abnormality. Gallbladder unremarkable. Pancreas: No focal abnormality or ductal dilatation. Spleen: No focal abnormality.  Normal size. Adrenals/Urinary Tract: Adrenal glands normal. Punctate left lower pole nephrolithiasis. No hydronephrosis. Benign appearing right lower pole cyst measures 6 cm. No follow-up imaging recommended. Stomach/Bowel: Postsurgical changes in the rectum. Again noted are probable frank dehiscence changes. Air again noted around the rectum in the perirectal space. Multiple small fluid collections and  air collections in the abdomen and pelvis. The largest is noted anteriorly measuring 4 x 2.7 cm on image 47. This is just deep to and superior to and indwelling pigtail drainage catheter. Multiple other smaller fluid collections noted along the descending colon and sigmoid colon in the left lower quadrant and along small bowel loops in the right lower quadrant. No bowel  obstruction. Vascular/Lymphatic: Aortic atherosclerosis. No evidence of aneurysm or adenopathy. IVC filter in place. Reproductive: Prior hysterectomy.  No adnexal masses. Other: Trace free fluid in the pelvis. A few locules of extraluminal gas scattered throughout the abdomen and pelvis within the fluid collections described above. Musculoskeletal: No acute bony abnormality. IMPRESSION: Similar appearance at the rectal anastomosis concerning for dehiscence. Interval placement of pigtail drainage catheter anteriorly in the pelvis. There are several small fluid collections throughout the abdomen and pelvis with largest anteriorly in the midline of the pelvis measuring 4 x 2.7 cm. Small bilateral pleural effusions. Compressive atelectasis in the lower lobes. Aortic atherosclerosis. Electronically Signed   By: Franky Crease M.D.   On: 05/26/2024 14:44   CT GUIDED PERITONEAL/RETROPERITONEAL FLUID DRAIN BY PERC CATH Result Date: 05/21/2024 INDICATION: Multiple pelvic and abdominal postoperative abscesses. Planned placement of percutaneous drainage catheters in the 2 largest collections. EXAM: CT-guided drain placement x2 TECHNIQUE: Multidetector CT imaging of the pelvis was performed following the standard protocol without IV contrast. RADIATION DOSE REDUCTION: This exam was performed according to the departmental dose-optimization program which includes automated exposure control, adjustment of the mA and/or kV according to patient size and/or use of iterative reconstruction technique. MEDICATIONS: The patient is currently admitted to the hospital and receiving intravenous antibiotics. The antibiotics were administered within an appropriate time frame prior to the initiation of the procedure. ANESTHESIA/SEDATION: Moderate (conscious) sedation was employed during this procedure. A total of Versed  1.5 mg and Fentanyl  75 mcg was administered intravenously by the radiology nurse. Total intra-service moderate Sedation Time:  58 minutes. The patient's level of consciousness and vital signs were monitored continuously by radiology nursing throughout the procedure under my direct supervision. COMPLICATIONS: None immediate. PROCEDURE: Informed written consent was obtained from the patient after a thorough discussion of the procedural risks, benefits and alternatives. All questions were addressed. Maximal Sterile Barrier Technique was utilized including caps, mask, sterile gowns, sterile gloves, sterile drape, hand hygiene and skin antiseptic. A timeout was performed prior to the initiation of the procedure. With the patient in a supine position, radiopaque markers were placed near the umbilicus and initial images of the pelvis were obtained. The patient's skin was then marked, prepped, and draped in the usual sterile fashion. Local anesthesia was achieved by infiltrating subcutaneous tissue with 1% lidocaine . A small incision was made in the right lower quadrant and a 7 cm Yueh needle was advanced and repeat imaging demonstrated the needle to be within the abscess. The needle was withdrawn leaving the cannula in position for an Amplatz wire to be advanced into the collection. The access site was then dilated using a 10 [place] dilator. A 10 French drainage catheter was then advanced over the guidewire and placed in the collection. Retention suture, sterile dressing, and JP bulb for applied. The patient was then rotated to a prone position with radiopaque markers placed on the gluteal region. Repeat imaging was then used to try angulate location. The patient's skin was prepped and draped in usual sterile fashion. Local infiltration with 1% lidocaine  was used to anesthetize the region. A small incision was then made and  again a 7 cm Yueh needle was advanced from the skin into the collection. The needle was withdrawn and an Amplatz wire was advanced through the cannula. Repeat imaging demonstrated satisfactory position. The access site was  dilated using a 10 French fascial dilator. A 10 French drainage catheter was then advanced over the guidewire and locked in position. Retention suture and sterile dressing applied. The catheter was connected to a JP bulb. IMPRESSION: Satisfactory placement of 210 French drainage catheters and the largest pelvic abscess collections in this patient. Ventral near the umbilicus and transgluteal. Electronically Signed   By: Cordella Banner   On: 05/21/2024 10:00   CT ABDOMEN PELVIS W CONTRAST Result Date: 05/19/2024 CLINICAL DATA:  History of complicated diverticulitis with perforation status post colostomy takedown with abdominal distension EXAM: CT ABDOMEN AND PELVIS WITH CONTRAST TECHNIQUE: Multidetector CT imaging of the abdomen and pelvis was performed using the standard protocol following bolus administration of intravenous contrast. RADIATION DOSE REDUCTION: This exam was performed according to the departmental dose-optimization program which includes automated exposure control, adjustment of the mA and/or kV according to patient size and/or use of iterative reconstruction technique. CONTRAST:  OMNIPAQUE  IOHEXOL  300 MG/ML  SOLN COMPARISON:  CT abdomen and pelvis dated 10/19/2023 FINDINGS: Lower chest: Bilateral lower lobe relaxation atelectasis adjacent to small bilateral pleural effusions. No pneumothorax. Partially imaged heart size is normal. Hepatobiliary: No focal hepatic lesions. No intra or extrahepatic biliary ductal dilation. Gallbladder is contracted. 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 No main pancreatic ductal dilation. Spleen: Normal in size without focal abnormality. Adrenals/Urinary Tract: No adrenal nodules. No hydronephrosis or suspicious renal masses. Punctate nonobstructing left lower pole renal stone. Exophytic lower pole right renal cyst demonstrates peripheral calcification. No specific  follow-up imaging recommended. Underdistended urinary bladder contains small volume intraluminal gas and catheter in-situ. Stomach/Bowel: Normal appearance of the stomach. Postsurgical changes of prior low anterior section with colostomy takedown and small bowel anastomosis. Frank dehiscence along the rectal anastomosis extending from 1-3 o'clock (2:76) with moderate volume free air and intraluminal contents extending into the perirectal space and extending superiorly along the presacral space. Fluid and gas tracks to the anterior lower abdomen (2:73), where there is a moderately sized pocket of gas and fluid measuring 10.3 x 6.7 cm (2:72). This collection is also contiguous along the lateral right lower quadrant/pelvic sidewall (2:72). Multiple additional irregular fluid collections insinuate between loops of bowel, for example 3.9 x 2.0 cm right lower quadrant (2:64) and surrounding the descending/proximal sigmoid colon in the left lower quadrant measuring 5.0 x 2.2 cm (2:58). Vascular/Lymphatic: Aortic atherosclerosis. IVC filter in-situ. No enlarged abdominal or pelvic lymph nodes. Reproductive: No adnexal masses. Other: Free air and fluid collections as described. Musculoskeletal: No acute or abnormal lytic or blastic osseous lesions. Small foci of subcutaneous emphysema within the right anterior abdominal wall. Mild body wall edema. Postsurgical changes of the anterior abdominal wall. IMPRESSION: 1. Postsurgical changes of prior low anterior section with colostomy takedown and small bowel anastomosis. Frank dehiscence along the rectal anastomosis extending from 1-3 o'clock with moderate volume free air and intraluminal contents extending into the perirectal space and extending superiorly along the presacral space. Multiple irregular fluid collections as described. 2. Small bilateral pleural effusions with adjacent relaxation atelectasis. 3.  Aortic Atherosclerosis (ICD10-I70.0). Critical Value/emergent results  were called by telephone at the time of interpretation on 05/19/2024 at 1:15 pm to provider STEVEN GROSS , who verbally  acknowledged these results. Electronically Signed   By: Limin  Xu M.D.   On: 05/19/2024 13:45   DG CHEST PORT 1 VIEW Result Date: 05/12/2024 CLINICAL DATA:  [id number] S/P PICC central line placement [id number] EXAM: PORTABLE CHEST - 1 VIEW COMPARISON:  May 11, 2024 FINDINGS: Right PICC terminates lower SVC. Esophagogastric tube extends below the level of the diaphragms with the distal tip outside the field of view. However, the last side hole appears in the region of the stomach. No focal airspace consolidation, pleural effusion, or pneumothorax. No cardiomegaly. IMPRESSION: 1. Right PICC terminates lower SVC. 2. Esophagogastric tube extends below the level of the diaphragm with the distal tip outside the field of view. However, the last sidehole appears in the region of the stomach. Electronically Signed   By: Rogelia Myers M.D.   On: 05/12/2024 11:59   DG Abd 1 View Result Date: 05/12/2024 CLINICAL DATA:  NG tube placement EXAM: ABDOMEN - 1 VIEW COMPARISON:  October 26, 2023 FINDINGS: the tip of the nasogastric tube is located within the antrum stomach in good position. IVC filter in place IMPRESSION: Nasogastric tube in good position. Electronically Signed   By: Franky Chard M.D.   On: 05/12/2024 08:53   US  EKG SITE RITE Result Date: 05/12/2024 If Site Rite image not attached, placement could not be confirmed due to current cardiac rhythm.  DG CHEST PORT 1 VIEW Result Date: 05/11/2024 EXAM: 1 VIEW XRAY OF THE CHEST 05/11/2024 06:11:00 PM COMPARISON: 05/09/2024 CLINICAL HISTORY: Dyspnea, had bleeding from rectum earlier today and became unresponsive. FINDINGS: LUNGS AND PLEURA: No focal pulmonary opacity. No pulmonary edema. No pleural effusion. No pneumothorax. HEART AND MEDIASTINUM: No acute abnormality of the cardiac and mediastinal silhouettes. BONES AND SOFT TISSUES: No acute osseous  abnormality. IMPRESSION: 1. No acute process. Electronically signed by: Pinkie Pebbles MD 05/11/2024 07:06 PM EDT RP Workstation: [id number]    Labs:  CBC: Recent Labs    06/06/24 0346 06/07/24 0423 06/08/24 0406 06/09/24 0406  WBC 11.7* 13.1* 10.3 10.8*  HGB 8.0* 8.6* 8.0* 7.4*  HCT 25.8* 27.6* 26.0* 24.4*  PLT 353 380 319 351    COAGS: Recent Labs    10/05/23 0247 10/11/23 1255 10/12/23 0655 05/19/24 2054 06/04/24 1522 06/05/24 0429  INR 1.1 1.1 1.1 1.2  --   --   APTT 30  --   --   --  41* 40*    BMP: Recent Labs    06/07/24 0423 06/08/24 0406 06/09/24 0406 06/10/24 0400  NA 133* 133* 134* 138  K 3.8 3.4* 3.9 3.6  CL 101 104 105 108  CO2 21* 22 22 23   GLUCOSE 133* 124* 128* 161*  BUN 19 23 23  29*  CALCIUM  7.8* 7.6* 7.7* 7.9*  CREATININE 0.47 0.43* 0.46 0.40*  GFRNONAA >60 >60 >60 >60    LIVER FUNCTION TESTS: Recent Labs    06/02/24 0255 06/04/24 0609 06/05/24 0830 06/09/24 0406  BILITOT 0.9 1.2 1.3* 1.4*  AST 25 34 26 22  ALT 26 23 21 21   ALKPHOS 183* 129* 146* 196*  PROT 7.1 6.2* 6.4* 6.1*  ALBUMIN  1.6* 1.7* 1.7* 1.5*    TUMOR MARKERS: No results for input(s): AFPTM, CEA, CA199, CHROMGRNA in the last 8760 hours.  Assessment and Plan:  Post-op fluid collections: Elizabeth Odom, 83 year old female, is tentatively scheduled tomorrow for image-guided intra-abdominal fluid collection aspirations with drain placements. The procedure was discussed with the patient at the bedside who stated she would defer to her  granddaughter, Almarie. I discussed the procedure with Almarie and telephone consent was obtained.   Risks and benefits discussed with the patient including bleeding, infection, damage to adjacent structures, bowel perforation/fistula connection, and sepsis.  All of the patient's questions were answered, patient is agreeable to proceed. She will be NPO at midnight. An order to hold tube feeds was placed. Patient has heparin   infusing. Someone from IR will communicate with the bedside nurse 06/11/24 about when to hold this infusion.   Consent signed and in the IR APP office at [facility].  Thank you for this interesting consult.  I greatly enjoyed meeting Shyane M Koppen and look forward to participating in their care.  A copy of this report was sent to the requesting provider on this date.  Electronically Signed: Warren Dais, AGACNP-BC 06/10/2024, 2:32 PM   I spent a total of 20 Minutes    in face to face in clinical consultation, greater than 50% of which was counseling/coordinating care for intra-abdominal fluid collections.

## 2024-06-10 NOTE — Progress Notes (Signed)
 Daily Progress Note   Patient Name: Elizabeth Odom       Date: 06/10/2024 DOB: [birth year]  Age: 83 y.o. MRN#: [id number] Attending Physician: Sonjia Held, MD Primary Care Physician: Jesus Bernardino MATSU, MD Admit Date: 05/08/2024 Length of Stay: 33 days  Reason for Consultation/Follow-up: Establishing goals of care  Subjective:   CC: Patient hopeful to continue regaining strength.  Following up regarding complex medical decision making.  Subjective:  Extensive review of EMR prior to presenting to bedside.  Reviewed recent documentation from nutrition, hospitalist, SLP, and surgery.  Patient had CT of abdomen pelvis performed today which showed left upper quadrant fluid collections.  IR involved to place drain to make sure these are not abscesses or other sites of concern.  Surgical team managing patient's postop pain and making adjustments to medications accordingly.  Presented to bedside to meet with patient.  No visitors present at bedside.  Patient lying comfortably in bed.  Able to introduce myself as a member of the palliative medicine team and my role in patient's medical journey.  Noted had previously met patient during hospitalization in 2024.  Spent time providing emotional support for patient as she was anxious.  Inquired about patient's hopes for care moving forward.  Patient is hopeful to be able to return home to have her independence.  With permission did express concern about patient's worsening physical status and current care requirements.  Patient noted that she is willing to continue working with physical therapy to hopefully regain some strength.  Acknowledged this and encouraged participation. Did inquire about patient's CODE STATUS.  Patient had previously stated that she did not want to be put on ventilator for life support or receive cardiac resuscitation in the event of cardiac arrest.  Inquired about patient's feelings for this at this point since her CODE STATUS is  currently full.  Patient again states she does not want to receive cardiac resuscitation or intubation with mechanical ventilation.  Acknowledged this from patient and inquired if she had spoken with her granddaughter about these wishes.  Patient gave this provider permission to call granddaughter to discuss further. Spent time providing emotional support via active listening.  Patient noted appreciation for progress of healing.  Acknowledges this and noted would involve chaplain as well.  Able to call patient's granddaughter and [place] Elizabeth Odom.  Introduced myself as a member of the palliative medicine team and my role in patient's medical journey.  Elizabeth noted that she had received updates from surgery team regarding plan for another IR intervention.  Elizabeth supportive of proceeding with this.  With permission, did attempt to address CODE STATUS conversation.  Discussed conversation had with patient and that patient has continued to states she does not want to receive cardiac resuscitation or intubation with mechanical ventilation.  Elizabeth states that patient had told her she would be willing to receive these interventions though does not want them long term.  Acknowledged their conversation and also encouraged further conversation to provide clarity knowing that patient has multiple medical illnesses and concerns that intubation or cardiac resuscitation would not lead to quality of life outcomes the patient is describing such as having her independence.  Noted would continue full code at this time.   Discussed would involve chaplain for spiritual support at patient's request.  All questions answered at that time.  Noted palliative medicine team continue to follow along with patient's medical journey.  Updated hospitalist and RN regarding conversation.  Objective:   Vital Signs:  BP ROLLEN)  148/87 (BP Location: Left Arm)   Pulse (!) 108   Temp 98.7 F (37.1 C) (Oral)   Resp 14   Ht 5'  5 (1.651 m)   Wt 66.6 kg   SpO2 97%   BMI 24.43 kg/m   Physical Exam: General: NAD, alert, awake, cachectic, frail Cardiovascular: Tachycardia noted Respiratory: no increased work of breathing noted, not in respiratory distress Neuro: A&Ox4, following commands easily Psych: Anxious  Assessment & Plan:   Assessment: Patient is an 83 year old female with a past medical history of DVT, PE, atrial fibrillation, hypertension, mild cognitive impairment, and large bowel obstruction status post colonic resection and colostomy creation in 2020 for which has been complicated by anastomotic leak.  Patient presented last month for colostomy resection and takedown but had complicated postop course with dense adhesions, A-fib with RVR, intra-abdominal abscess, and poor oral intake requiring support of TPN and PEG placement.  Palliative medicine team consulted to assist with complex medical decision making.  Recommendations/Plan: # Complex medical decision making/goals of care:  - Discussed care with patient as detailed above in HPI.  Patient continues to hope for slow improvements in strength.  Patient noting that her goal is to eventually return home and be independent.  Acknowledged this while also expressing concerns about patient's level of care needs.  Did discuss CODE STATUS with patient as she had previously told this provider during prior hospitalization that she did not want to be put on life support.  While patient again confirms she does not want cardiac or respiratory resuscitation, granddaughter Elizabeth notes that patient does want this and just does not want these interventions long-term.  Encouraged further conversations between them as concerned that interventions such as cardiac resuscitation and intubation with mechanical ventilation would not lead to quality of life outcomes that patient has described such as maintaining her independence at home.  Noted palliative medicine team will  continue to engage in conversations as able and appropriate.  -  Code Status: Full Code  # Symptom management: Surgical team managing patient's postop pain.  # Psychosocial support  - Consulted chaplain to assist with spiritual support.  # Discharge Planning: To Be Determined  Discussed with: Patient, patient's granddaughter-Elizabeth, RN, hospitalist  Thank you for allowing the palliative care team to participate in the care Nakesha M Bingaman.  Tinnie Radar, DO Palliative Care Provider PMT # [phone]  If patient remains symptomatic despite maximum doses, please call PMT at [phone] between 0700 and 1900. Outside of these hours, please call attending, as PMT does not have night coverage.  Personally spent 50 minutes in patient care including extensive chart review (labs, imaging, progress/consult notes, vital signs), medically appropraite exam, discussed with treatment team, education to patient, family, and staff, documenting clinical information, medication review and management, coordination of care, and available advanced directive documents.

## 2024-06-10 NOTE — Progress Notes (Signed)
 [facility]    Date of Admission:  05/08/2024   Total days of antibiotics 23/daptomycin  and micafungin  and piptazo   ID: Elizabeth Odom is a 83 y.o. female with   Principal Problem:   Diverticular disease Active Problems:   A-fib (HCC)   Restless leg   ABLA (acute blood loss anemia)   Need for emotional support   Acute urinary retention   Mixed hyperlipidemia   Essential hypertension   Diverticular disease of left colon   History of DVT (deep vein thrombosis)   History of pulmonary embolus (PE)   Pre-diabetes   Presence of IVC filter   Stricture of sigmoid colon (HCC)   Protein-calorie malnutrition, severe   Hypokalemia    Subjective: Afebrile. Had abd CT showing multiple fluid collections in upper abdomina suspect hematoma in the setting of hct drop. Continues to transition to tube feeds.  IR evaluating for drain placement  Stool noted in ostomy bag. Still some soreness associated with recent abdominal surgery  Leukocytosis improved.  Medications:   acetaminophen   1,000 mg Per Tube Q6H   amLODipine   5 mg Oral QHS   vitamin C   500 mg Per Tube Daily   Chlorhexidine  Gluconate Cloth  6 each Topical Daily   ferrous sulfate  325 mg Oral BID WC   methocarbamol  (ROBAXIN ) injection  500 mg Intravenous Q8H   metoprolol  tartrate  12.5 mg Per Tube BID   nystatin    Topical BID   mouth rinse  15 mL Mouth Rinse 4 times per day   oxyCODONE   5 mg Oral QHS,MR X 1   pantoprazole   40 mg Oral Q1200   polycarbophil  625 mg Per Tube BID   potassium chloride   40 mEq Oral Daily   sodium chloride  flush  10-40 mL Intracatheter Q12H   sodium chloride  flush  3 mL Intravenous Q12H    Objective: Vital signs in last 24 hours: Temp:  [98 F (36.7 C)-98.7 F (37.1 C)] 98.7 F (37.1 C) (07/01 1215) Pulse Rate:  [84-108] 101 (07/01 1215) Resp:  [14-18] 18 (07/01 1215) BP: (134-156)/(68-92) 134/68 (07/01 1215) SpO2:  [97 %-99 %] 99 % (07/01 1215) Weight:   [66.6 kg] 66.6 kg (07/01 0500)  Physical Exam  Constitutional:  oriented to person, place, and time. appears frail and under-nourished. No distress.  HENT: [place]/AT, PERRLA, no scleral icterus Mouth/Throat: Oropharynx is clear and moist. No oropharyngeal exudate.  Cardiovascular: Normal rate, regular rhythm and normal heart sounds. Exam reveals no gallop and no friction rub.  No murmur heard.  Pulmonary/Chest: Effort normal and breath sounds normal. No respiratory distress.  has no wheezes.  Neck = supple, no nuchal rigidity Abdominal: Soft. Bowel sounds are normal.  exhibits no distension. There is no tenderness. Ostomy+, wound vac in place Lymphadenopathy: no cervical adenopathy. No axillary adenopathy Neurological: alert and oriented to person, place, and time.  Skin: Skin is warm and dry. No rash noted. No erythema.  Psychiatric: a normal mood and affect.  behavior is normal.    Lab Results Recent Labs    06/08/24 0406 06/09/24 0406 06/10/24 0400  WBC 10.3 10.8*  --   HGB 8.0* 7.4*  --   HCT 26.0* 24.4*  --   NA 133* 134* 138  K 3.4* 3.9 3.6  CL 104 105 108  CO2 22 22 23   BUN 23 23 29*  CREATININE 0.43* 0.46 0.40*   Liver Panel Recent Labs    06/09/24 0406  PROT 6.1*  ALBUMIN  1.5*  AST 22  ALT 21  ALKPHOS 196*  BILITOT 1.4*   Sedimentation Rate No results for input(s): ESRSEDRATE in the last 72 hours. C-Reactive Protein No results for input(s): CRP in the last 72 hours.  Microbiology: 6/17 rare pseudomonas and c.albicans 6/12 e.faecalis, psa, MRSA, pseudohyphae Studies/Results: CT ABDOMEN PELVIS W CONTRAST Result Date: 06/10/2024 CLINICAL DATA:  Abdominal pain, postop status post diverting transverse colostomy with history of anastomotic leak EXAM: CT ABDOMEN AND PELVIS WITH CONTRAST TECHNIQUE: Multidetector CT imaging of the abdomen and pelvis was performed using the standard protocol following bolus administration of intravenous contrast. RADIATION DOSE  REDUCTION: This exam was performed according to the departmental dose-optimization program which includes automated exposure control, adjustment of the mA and/or kV according to patient size and/or use of iterative reconstruction technique. CONTRAST:  OMNIPAQUE  IOHEXOL  300 MG/ML  SOLN COMPARISON:  06/02/2024 FINDINGS: Lower chest: Moderate bilateral pleural effusions with dependent lower lobe atelectasis. Hepatobiliary: Gallbladder is decompressed, with no evidence of calcified gallstones or gallbladder wall thickening. The liver is unremarkable. No biliary duct dilation. Pancreas: Unremarkable. No pancreatic ductal dilatation or surrounding inflammatory changes. Spleen: Normal in size without focal abnormality. Adrenals/Urinary Tract: Stable punctate less than 2 mm nonobstructing left renal calculus. No right-sided calculi or obstruction. Stable minimally complex right renal cyst does not require specific follow-up. The adrenals are stable. The bladder is decompressed with a Foley catheter. Stomach/Bowel: There is a percutaneous gastrostomy tube identified within the lumen of the gastric antrum. Interval colostomy involving the proximal transverse colon within the right mid abdomen. Postsurgical changes from distal colectomy, with rectal stump identified. Interval removal of the pigtail drainage catheter previously seen within the lower pelvis/rectum. No bowel obstruction or ileus. There is nonspecific wall thickening of the transverse colon given decompressed state. There are multiple complex fluid collections within the abdomen. Along the ventral margin of the stomach there are 2 collections measuring 7.2 x 6.1 cm image 22/2, and 6.8 x 6.0 cm image 33/2, resulting in mass effect upon the stomach. The more inferior collection extends inferiorly to the level of the umbilicus, with fluid-fluid levels and hyperdense material layering dependently in these collections, most prominently at the umbilicus measuring  3.5 x 4.4 cm reference image 50/2. This could reflect hematocrit level from previous hemorrhage, versus layering material from bowel contents given history of prior anastomosis breakdown. There is no evidence of contrast extravasation to suggest active hemorrhage. Vascular/Lymphatic: Aortic atherosclerosis. IVC filter again noted. No enlarged abdominal or pelvic lymph nodes. Reproductive: Status post hysterectomy. No adnexal masses. Other: In addition to the multiple fluid collections described above, there is a small amount of free fluid throughout the abdomen and pelvis, greatest along the inferior margin of the liver. There is no free intraperitoneal gas. Indwelling surgical drain identified entering via the left mid abdomen, tip in the left lower pelvis. Musculoskeletal: Diffuse subcutaneous edema. No acute or destructive bony abnormalities. Reconstructed images demonstrate no additional findings. IMPRESSION: 1. Multiple complex fluid collections within the upper abdomen as above, containing fluid-fluid levels as above. Differential would include hematocrit levels from prior hemorrhage, versus layering material from bowel contents given prior history of bowel perforation. Continued follow-up recommended. 2. Interval diverting transverse colostomy within the right mid abdomen. No bowel obstruction or ileus. 3. Nonspecific wall thickening of the transverse colon, which may be due to decompressed state. 4. Support devices as above. 5. Moderate bilateral pleural effusions with dependent lower lobe atelectasis. 6. Small volume free  fluid, most pronounced in the right upper quadrant. 7. Diffuse body wall edema. Critical Value/emergent results were called by telephone at the time of interpretation on 06/10/2024 at 10:57 am to provider [facility], who verbally acknowledged these results. Electronically Signed   By: Ozell Daring M.D.   On: 06/10/2024 11:00     Assessment/Plan: Polymicrobial intra-abdominal  abscess = continue on piptazo, dapto and micafungin .   Aspirate intra-abdominal fluid collection (superior fluid collections) - to see if + cx (please send fluid for aerobic/anaerobic and fungal cultures) --if remains clear and no further growth, can consider to stop abtx in   [facility] Pager: [phone]  06/10/2024, 4:05 PM

## 2024-06-10 NOTE — Progress Notes (Addendum)
 06/10/2024  Esmeralda M Gallicchio [id number] 1941/02/05  CARE TEAM: PCP: Jesus Bernardino MATSU, MD  Outpatient Care Team: Patient Care Team: Jesus Bernardino MATSU, MD as PCP - General (Internal Medicine) Elmira Newman PARAS, MD as PCP - Cardiology (Cardiology) [name], MD as Consulting Physician (Gastroenterology) Rubin Calamity, MD as Consulting Physician (General Surgery) [name], MD (Neurology) [name], MD as Consulting Physician (Urology) [name], MD as Consulting Physician (Cardiology) [name], MD as Consulting Physician (Colon and Rectal Surgery)  Inpatient Treatment Team: Treatment Team:  Pokhrel, Laxman, MD Nikki Rams, Aliene, MD Dennise Kingsley, MD [name], RN Rosan Deward ORN, NP Bobbette File, MD Sonjia Held, MD [name], MD [name], A, OT Alquiros, Ray DELENA, RN Izola Brodie, RN Nicolas Emmie DELENA, CCC-SLP   Problem List:   Principal Problem:   Diverticular disease Active Problems:   A-fib [facility])   Restless leg   ABLA (acute blood loss anemia)   Need for emotional support   Acute urinary retention   Mixed hyperlipidemia   Essential hypertension   Diverticular disease of left colon   History of DVT (deep vein thrombosis)   History of pulmonary embolus (PE)   Pre-diabetes   Presence of IVC filter   Stricture of sigmoid colon (HCC)   Protein-calorie malnutrition, severe   Hypokalemia   05/08/2024  POST-OPERATIVE DIAGNOSIS:   COLOSTOMY FOR RESECTION, DESIRE FOR OSTOMY TAKEDOWN RECTAL STRICTURE HISTORY OF DIVERTICULITIS WITH PERFORATION & ABSCESS   PROCEDURE:   -ROBOTIC RECTOSIGMOID RESECTION (LAR) -TAKEDOWN OF END COLOSTOMY WITH ANASTOMOSIS -RESECTION OF SMALL INTESTINE WITH ANASTOMOSIS -SMALL BOWEL REPAIR -LYSIS OF ADHESIONS x 115 MINUTES (66% OF CASE),  -INTRAOPERATIVE ASSESSMENT OF TISSUE VASCULAR PERFUSION USING ICG (indocyanine green ) IMMUNOFLUORESCENCE,  -TRANSVERSUS ABDOMINIS PLANE (TAP) BLOCK -  BILATERAL -FLEXIBLE SIGMOIDOSCOPY   SURGEON:  [name], MD   05/20/2024  Post procedural Dx: Post op abscess, multiple   Technically successful CT guided placed of a  10 Fr drainage catheter placement x 2  into the midline ventral and dorsal perirectal abscesses.     [name] DELENA, MD    05/28/2024  IMPRESSION: Satisfactory removal of the midline anterior abscess drainage catheter and placement of a new midline anterior pelvic drainage catheter as well as a posterior transgluteal drainage catheter in pelvic abscesses.     Electronically Signed   By: [name]    06/03/2024  POST-OPERATIVE DIAGNOSIS:   DELAYED COLORECTAL ANASTOMOTIC LEAK, NEED FOR FECAL DIVERSION CHRONIC PELVIC ABSCESS   PROCEDURE:   DIAGNOSTIC LAPAROSCOPY CONVERTED TO LAPAROTOMY DIVERTING LOOP TRANSVERSE COLOSTOMY DRAINAGE OF ABDOMINAL ABSCESS GASTROSTOMY TUBE PLACEMENT RECTAL TUBE PLACEMENT   SURGEON:  [name], MD   OR FINDINGS: Very dense concrete intraperitoneal and pelvic adhesions infraumbilically.  Bladder, small bowel, omentum walling off the lower pelvis with delayed anastomotic leak.   Near disruption of colorectal anastomosis by transanal examination.  Rectal tube placed through rectal stump to drain the pelvis more directly.   Diverting mid-transverse loop colostomy done.   Removal of old percutaneous abdominal and transgluteal drains.     Assessment [facility] Stay = 33 days) 7 Days Post-Op    Failure to thrive with delayed anastomotic leak    Plan:  Delayed anastomotic leak   status post percutaneous drainage with some improvement and then decline after drain fell out and no improvement after replacement drainage.  Diverting loop transverse colostomy (lower abdomen extremely fixed).  6/25 with very fixed abdomen.    Intraperitoneal  drainage going over near prior abdominal abscess down towards left pelvis.  Keep for now.  Keep perianal region  keep the area clean and dry with sitz baths and dry powder.  They ordered 3 times daily and as needed.  Severe malnutrition -biggest risk for now  Tolerating tube feeds at goal.  Start cycling nightly tube feeds.  If tolerates that then switch to bolus tube feeds.  Wean off TPN.  Try to encourage oral diet.  Dysphagia one per speech therapy recommendations.  Okay to do solid food from my standpoint   Infection: -Zosyn , Daptomycin , Micafungin  per ID  Ordered CAT scan yesterday morning.  He still has not happened.  Discussed with nurse and made it stat.  Need to make sure there are no new concerns.  If CAT scan 7/1 today underwhelming for new abscess or other concerns, per surgical drain and stop all antibiotics and regroup.  See if infectious disease agrees.  -Continue wound VAC and midline incision.  Suspect it will be superficial enough that we can just switch to dry dressing later in the week.  Will see.  Some recurrent atrial fibrillation and rapid rate.    PO/Gtube metoprolol .  Add back amlodipine  since starting to get hypertensive again.  See if medicine agrees.  Anticoagulation.  On heparin  drip.   If CAT scan negative and no need for drains or procedures, switch over to DOAC and stop heparin  drip.    Consider stopping telemetry as well if medicine agrees since rate has been mostly controlled and cardiology's last input noted a tolerance of mild tachycardia in the 100-110s.  History of thromboembolic events and IVC filter -anticoagulation as above.    Acute blood loss anemia in the setting of anemia of chronic disease.    Transfused 6/26.  Transfuse again if hemoglobin less than 7.  IV iron .    Started enteral iron  with vitamin C  6/30.  I would not trust enteral route to be totally adequate but may help  History of urinary retention.  Will keep Foley catheter for now.  May consider removing with intermittent I&O cath or Flexi-Seal if needed.  Get CAT scan and diuresis  first.  See if medicine agrees.    -monitor electrolytes & replace as needed.  Keep K>4, Mg>2, Phos>3.  Intermittent potassium supplementation.  Will give enteral potassium x 5 days and follow since I suspect she will need more diuresis.  See if medicine agrees  -Diuresis.  Get edema down.    -Pain control.  Scheduled Tylenol .  Will give oxycodone  nightly.  She prefers to take Dilaudid  but is very sedating.  Will try and wean interval and encourage nursing to use oxycodone  first in the hopes that she will have a more smooth longer acting pain control  -mobilize as tolerated to help recovery.  Try and get therapists involved to help her mobilize more.  Biggest challenge.  Patient does not like the idea of going to a skilled facility or rehab as she needed after her month hospital stay last year from emergency surgery.  I do not think that that is avoidable at this time. Work to have strategies to help her get up and mobilize and actually work with physical therapy, or she is going to lose her argument.    I updated the patient's status to the patient and nurse.  Recommendations were made.  Questions were answered.  They expressed understanding & appreciation.  -Disposition: TBD - I think she needs SNF.  She  has been very resistant, but she may reconsider.  Agree with palliative care in the hopes of providing better pain and symptom control.  I am hopeful that if we can get her nutrition better mobilize she will recover but it is a challenge.  She has been through a lot.      I reviewed last 24 h vitals and pain scores, last 48 h intake and output, last 24 h labs and trends, and last 24 h imaging results.  I have reviewed this patient's available data, including medical history, events of note, test results, etc as part of my evaluation.   A significant portion of that time was spent in counseling. Care during the described time interval was provided by me.  This care required moderate level of  medical decision making.  06/10/2024    Subjective: (Chief complaint)  Patient tired.  Not eating much.  Tube feeds at goal.  No major events according to nursing.  Declines other meds but Dilaudid  for her pain    Objective:  Vital signs:  Vitals:   06/09/24 1252 06/09/24 1952 06/10/24 0409 06/10/24 0500  BP: 136/70 (!) 156/92 (!) 148/87   Pulse: 99 84 (!) 108   Resp: 20 16 14    Temp: 97.8 F (36.6 C) 98 F (36.7 C) 98.7 F (37.1 C)   TempSrc: Oral Oral Oral   SpO2: 99% 99% 97%   Weight:    66.6 kg  Height:        Last BM Date : 06/09/24  Intake/Output   Yesterday:  06/30 0701 - 07/01 0700 In: 1988.5 [P.O.:10; I.V.:1613.5; IV Piggyback:365] Out: 2655 [Urine:2175; Drains:30; Stool:450] This shift:  No intake/output data recorded.  Bowel function:  Flatus: YES  BM:  YES   Drain:  Left lower quadrant Blake drain serosanguineous going down to left lateral pelvis.  Physical Exam:  General: Awake.  Mildly withdrawn but answers questions.  Exhausted.  Depressed.           Eyes: PERRL, normal EOM.  Sclera clear.  No icterus Neuro: CN II-XII intact w/o focal sensory/motor deficits.  No facial droop. Lymph: No head/neck/groin lymphadenopathy Psych:  No delerium/psychosis/paranoia.  Oriented x 4 HENT: Normocephalic, Mucus membranes moist.  No thrush.  Mild HOH Neck: Supple, No tracheal deviation.  No obvious thyromegaly.  Chest: No pain to chest wall compression.  Good respiratory excursion.  Mild audible wheezing CV:  Pulses intact.  Regular rhythm.  No major extremity edema MS: Normal AROM mjr joints.  No obvious deformity  Abdomen:  Soft.  Mildy distended.  Mildly tender at incisions only.  No guarding.   Right upper quadrant mid transverse diverting loop colostomy pink with edema.  Gas and stool in bag    GU: Foley in place with light yellow clear urine  Rectal: Some erythema but improved overall.  No diarrhea in bed. . Ext:   No deformity.  No mjr  edema.  No cyanosis Skin: No petechiae / purpurea.  No major sores.  Warm and dry    Results:   Cultures: Recent Results (from the past 720 hours)  MIC (1 Drug)-Abdominal drain abscess; 05/22/2024; Abdomen; MRSA; Daptomycin      Status: Abnormal   Collection Time: 05/22/24  1:54 PM   Specimen: Abdomen  Result Value Ref Range Status   Min Inhibitory Conc (1 Drug) Final report (A)  Corrected    Comment: (NOTE) Performed At: [facility] [address] [place], [place] [id number] Jennette Shorter MD Ey:[id number]  CORRECTED ON 06/24 AT 0836: PREVIOUSLY REPORTED AS Preliminary report    Source ABSCESS  Final    Comment: Performed at [facility], [address] [place], [place] [address]  MIC Result     Status: Abnormal   Collection Time: 05/22/24  1:54 PM  Result Value Ref Range Status   Result 1 (MIC) Comment (A)  Final    Comment: (NOTE) Methicillin - resistant Staphylococcus aureus Identification performed by account, not confirmed by this laboratory. DAPTOMYCIN    <=1 UG/ML = SUSCEPTIBLE Performed At: [place] [facility] [address] [place], [place] [id number] Jennette Shorter MD Ey:[id number]   Aerobic/Anaerobic Culture w Gram Stain (surgical/deep wound)     Status: None   Collection Time: 05/22/24  3:59 PM   Specimen: Abdomen  Result Value Ref Range Status   Specimen Description   Final    ABDOMEN Performed at [facility], [address] [place], [place] [address]    Special Requests   Final    ABDOMINAL DRAIN Performed at [facility] [address], [place], [place] [address]    Gram Stain   Final    ABUNDANT WBC PRESENT, PREDOMINANTLY PMN RARE GRAM POSITIVE COCCI RARE YEAST WITH PSEUDOHYPHAE    Culture   Final    MODERATE PSEUDOMONAS AERUGINOSA MODERATE STAPHYLOCOCCUS AUREUS SUSCEPTIBILITIES PERFORMED ON PREVIOUS CULTURE WITHIN THE LAST 5 DAYS. MODERATE CANDIDA ALBICANS SEE SEPARATE REPORT NO ANAEROBES  ISOLATED Performed at [facility], [address] [place], [place] [address]    Report Status 06/04/2024 FINAL  Final   Organism ID, Bacteria PSEUDOMONAS AERUGINOSA  Final      Susceptibility   Pseudomonas aeruginosa - MIC*    CEFTAZIDIME 4 SENSITIVE Sensitive     CIPROFLOXACIN <=0.25 SENSITIVE Sensitive     GENTAMICIN 4 SENSITIVE Sensitive     IMIPENEM 2 SENSITIVE Sensitive     PIP/TAZO <=4 SENSITIVE Sensitive ug/mL    CEFEPIME 2 SENSITIVE Sensitive     * MODERATE PSEUDOMONAS AERUGINOSA  Aerobic/Anaerobic Culture w Gram Stain (surgical/deep wound)     Status: None   Collection Time: 05/22/24  3:59 PM   Specimen: Back  Result Value Ref Range Status   Specimen Description   Final    BACK Performed at [facility], [address] [place], [place] [address]    Special Requests   Final    TRANSGLUTEAL PERCUTANEOUS IR DRAIN Performed at [facility], [address] [place], [place] [address]    Gram Stain   Final    NO WBC SEEN ABUNDANT GRAM NEGATIVE RODS RARE GRAM POSITIVE COCCI IN PAIRS RARE BUDDING YEAST SEEN    Culture   Final    ABUNDANT PSEUDOMONAS AERUGINOSA ABUNDANT METHICILLIN RESISTANT STAPHYLOCOCCUS AUREUS ABUNDANT ENTEROCOCCUS FAECALIS ABUNDANT CANDIDA ALBICANS NO ANAEROBES ISOLATED Sent to [facility] for further susceptibility testing. Performed at [facility], [address] [place], [place] [address]    Report Status 05/27/2024 FINAL  Final   Organism ID, Bacteria PSEUDOMONAS AERUGINOSA  Final   Organism ID, Bacteria METHICILLIN RESISTANT STAPHYLOCOCCUS AUREUS  Final   Organism ID, Bacteria ENTEROCOCCUS FAECALIS  Final      Susceptibility   Enterococcus faecalis - MIC*    AMPICILLIN <=2 SENSITIVE Sensitive     VANCOMYCIN  1 SENSITIVE Sensitive     GENTAMICIN SYNERGY SENSITIVE Sensitive     * ABUNDANT ENTEROCOCCUS FAECALIS   Methicillin resistant staphylococcus aureus - MIC*    CIPROFLOXACIN >=  8 RESISTANT  Resistant     ERYTHROMYCIN  >=8 RESISTANT Resistant     GENTAMICIN <=0.5 SENSITIVE Sensitive     OXACILLIN >=4 RESISTANT Resistant     TETRACYCLINE <=1 SENSITIVE Sensitive     VANCOMYCIN  1 SENSITIVE Sensitive     TRIMETH/SULFA >=320 RESISTANT Resistant     CLINDAMYCIN <=0.25 SENSITIVE Sensitive     RIFAMPIN <=0.5 SENSITIVE Sensitive     Inducible Clindamycin NEGATIVE Sensitive     LINEZOLID 2 SENSITIVE Sensitive     * ABUNDANT METHICILLIN RESISTANT STAPHYLOCOCCUS AUREUS   Pseudomonas aeruginosa - MIC*    CEFTAZIDIME 4 SENSITIVE Sensitive     CIPROFLOXACIN <=0.25 SENSITIVE Sensitive     GENTAMICIN 4 SENSITIVE Sensitive     IMIPENEM 2 SENSITIVE Sensitive     PIP/TAZO <=4 SENSITIVE Sensitive ug/mL    CEFEPIME 2 SENSITIVE Sensitive     * ABUNDANT PSEUDOMONAS AERUGINOSA  Yeast Susceptibilities     Status: None   Collection Time: 05/22/24  3:59 PM  Result Value Ref Range Status   SOURCE CANDIDA ALBICAN ABD ABSC  Final    Comment: Performed at [facility], [address], [place], [place] [address]   Organism ID, Yeast Candida albicans  Final    Comment: (NOTE) Identification performed by account, not confirmed by this laboratory.    Amphotericin B MIC 0.25 ug/mL  Final    Comment: (NOTE) Breakpoints have been established for only some organism-drug combinations as indicated. This test was developed and its performance characteristics determined by [facility]. It has not been cleared or approved by the Food and Drug Administration.    Anidulafungin MIC Comment  Final    Comment: (NOTE) 0.03 ug/mL Susceptible Breakpoints have been established for only some organism-drug combinations as indicated. This test was developed and its performance characteristics determined by [facility]. It has not been cleared or approved by the Food and Drug Administration.    Caspofungin MIC Comment  Final    Comment: (NOTE) 0.12 ug/mL Susceptible Breakpoints have been established for only some  organism-drug combinations as indicated. This test was developed and its performance characteristics determined by [facility]. It has not been cleared or approved by the Food and Drug Administration.    Fluconazole  Islt MIC 1.0 ug/mL Susceptible  Final    Comment: (NOTE) Breakpoints have been established for only some organism-drug combinations as indicated. This test was developed and its performance characteristics determined by [facility]. It has not been cleared or approved by the Food and Drug Administration.    ISAVUCONAZOLE MIC 0.008 ug/mL or less  Final    Comment: (NOTE) This test was developed and its performance characteristics determined by [facility]. It has not been cleared or approved by the Food and Drug Administration.    Itraconazole MIC 0.06 ug/mL  Final    Comment: (NOTE) Breakpoints have been established for only some organism-drug combinations as indicated. This test was developed and its performance characteristics determined by [facility]. It has not been cleared or approved by the Food and Drug Administration.    Micafungin  MIC Comment  Final    Comment: (NOTE) 0.008 ug/mL or less, Susceptible Breakpoints have been established for only some organism-drug combinations as indicated. This test was developed and its performance characteristics determined by [facility]. It has not been cleared or approved by the Food and Drug Administration.    Posaconazole MIC 0.06 ug/mL  Final    Comment: (NOTE) Breakpoints have been established for only some organism-drug combinations as indicated. This test  was developed and its performance characteristics determined by [facility]. It has not been cleared or approved by the Food and Drug Administration.    REZAFUNGIN MIC Comment  Final    Comment: (NOTE) 0.15 ug/mL Susceptible This test was developed and its performance characteristics determined by [facility]. It has not been cleared or approved by the Food and Drug  Administration.    Voriconazole MIC Comment  Final    Comment: (NOTE) 0.15 ug/mL Susceptible Breakpoints have been established for only some organism-drug combinations as indicated. This test was developed and its performance characteristics determined by [facility]. It has not been cleared or approved by the Food and Drug Administration. Performed At: [facility] [address] [place], [place] [id number] Jennette Shorter MD Ey:[id number]   Aerobic/Anaerobic Culture w Gram Stain (surgical/deep wound)     Status: None   Collection Time: 05/27/24  5:19 PM   Specimen: Abscess  Result Value Ref Range Status   Specimen Description   Final    ABSCESS ABDOMEN Performed at [facility], [address] [place], [place] [address]    Special Requests   Final    NONE Performed at [facility], [address] [place], [place] [address]    Gram Stain   Final    ABUNDANT WBC PRESENT, PREDOMINANTLY PMN NO ORGANISMS SEEN    Culture   Final    RARE PSEUDOMONAS AERUGINOSA RARE CANDIDA ALBICANS NO ANAEROBES ISOLATED Performed at [facility], [address] [place], [place] [address]    Report Status 06/01/2024 FINAL  Final   Organism ID, Bacteria PSEUDOMONAS AERUGINOSA  Final      Susceptibility   Pseudomonas aeruginosa - MIC*    CEFTAZIDIME 4 SENSITIVE Sensitive     CIPROFLOXACIN <=0.25 SENSITIVE Sensitive     GENTAMICIN 4 SENSITIVE Sensitive     IMIPENEM 2 SENSITIVE Sensitive     PIP/TAZO <=4 SENSITIVE Sensitive ug/mL    CEFEPIME 2 SENSITIVE Sensitive     * RARE PSEUDOMONAS AERUGINOSA  Surgical pcr screen     Status: Abnormal   Collection Time: 06/02/24  7:55 PM   Specimen: Nasal Mucosa; Nasal Swab  Result Value Ref Range Status   MRSA, PCR POSITIVE (A) NEGATIVE Final   Staphylococcus aureus POSITIVE (A) NEGATIVE Final    Comment: (NOTE) The Xpert SA Assay (FDA approved for NASAL specimens in patients 19 years of age and  older), is one component of a comprehensive surveillance program. It is not intended to diagnose infection nor to guide or monitor treatment. Performed at [facility] [place], [address], [place], [place] [address]     Labs: Results for orders placed or performed during the hospital encounter of 05/08/24 (from the past 48 hours)  Glucose, capillary     Status: Abnormal   Collection Time: 06/08/24 11:05 AM  Result Value Ref Range   Glucose-Capillary 129 (H) 70 - 99 mg/dL    Comment: Glucose reference range applies only to samples taken after fasting for at least 8 hours.  Glucose, capillary     Status: Abnormal   Collection Time: 06/08/24  4:02 PM  Result Value Ref Range   Glucose-Capillary 129 (H) 70 - 99 mg/dL    Comment: Glucose reference range applies only to samples taken after fasting for at least 8 hours.  Glucose, capillary     Status: Abnormal   Collection Time: 06/08/24  7:56 PM  Result Value Ref Range   Glucose-Capillary 140 (H)  70 - 99 mg/dL    Comment: Glucose reference range applies only to samples taken after fasting for at least 8 hours.  Glucose, capillary     Status: Abnormal   Collection Time: 06/08/24 11:59 PM  Result Value Ref Range   Glucose-Capillary 109 (H) 70 - 99 mg/dL    Comment: Glucose reference range applies only to samples taken after fasting for at least 8 hours.  Prealbumin     Status: Abnormal   Collection Time: 06/09/24  4:06 AM  Result Value Ref Range   Prealbumin 13 (L) 18 - 38 mg/dL    Comment: Performed at [facility], [address], [place], [place] [address]  Comprehensive metabolic panel     Status: Abnormal   Collection Time: 06/09/24  4:06 AM  Result Value Ref Range   Sodium 134 (L) 135 - 145 mmol/L   Potassium 3.9 3.5 - 5.1 mmol/L   Chloride 105 98 - 111 mmol/L   CO2 22 22 - 32 mmol/L   Glucose, Bld 128 (H) 70 - 99 mg/dL    Comment: Glucose reference range applies only to samples taken after fasting for at  least 8 hours.   BUN 23 8 - 23 mg/dL   Creatinine, Ser 9.53 0.44 - 1.00 mg/dL   Calcium  7.7 (L) 8.9 - 10.3 mg/dL   Total Protein 6.1 (L) 6.5 - 8.1 g/dL   Albumin  1.5 (L) 3.5 - 5.0 g/dL   AST 22 15 - 41 U/L   ALT 21 0 - 44 U/L   Alkaline Phosphatase 196 (H) 38 - 126 U/L   Total Bilirubin 1.4 (H) 0.0 - 1.2 mg/dL   GFR, Estimated >39 >39 mL/min    Comment: (NOTE) Calculated using the CKD-EPI Creatinine Equation (2021)    Anion gap 7 5 - 15    Comment: Performed at [facility], [address], [place], [place] [address]  Magnesium      Status: None   Collection Time: 06/09/24  4:06 AM  Result Value Ref Range   Magnesium  1.8 1.7 - 2.4 mg/dL    Comment: Performed at [facility], [address], [place], [place] [address]  Phosphorus     Status: None   Collection Time: 06/09/24  4:06 AM  Result Value Ref Range   Phosphorus 2.7 2.5 - 4.6 mg/dL    Comment: Performed at [facility], [address], [place], [place] [address]  Triglycerides     Status: Abnormal   Collection Time: 06/09/24  4:06 AM  Result Value Ref Range   Triglycerides 205 (H) <150 mg/dL    Comment: Performed at [facility], [address], [place], [place] [address]  CBC     Status: Abnormal   Collection Time: 06/09/24  4:06 AM  Result Value Ref Range   WBC 10.8 (H) 4.0 - 10.5 K/uL   RBC 2.65 (L) 3.87 - 5.11 MIL/uL   Hemoglobin 7.4 (L) 12.0 - 15.0 g/dL   HCT 75.5 (L) 63.9 - 53.9 %   MCV 92.1 80.0 - 100.0 fL   MCH 27.9 26.0 - 34.0 pg   MCHC 30.3 30.0 - 36.0 g/dL   RDW 82.0 (H) 88.4 - 84.4 %   Platelets 351 150 - 400 K/uL   nRBC 0.3 (H) 0.0 - 0.2 %    Comment: Performed at [facility], [address], [place], [place] [address]  Heparin  level (unfractionated)  Status: None   Collection Time: 06/09/24  4:06 AM  Result Value Ref Range   Heparin  Unfractionated 0.36 0.30 - 0.70 IU/mL    Comment: (NOTE) The clinical  reportable range upper limit is being lowered to >1.10 to align with the FDA approved guidance for the current laboratory assay.  If heparin  results are below expected values, and patient dosage has  been confirmed, suggest follow up testing of antithrombin III levels. Performed at [facility], [address] [place], [place] [address]   Glucose, capillary     Status: Abnormal   Collection Time: 06/09/24  8:02 AM  Result Value Ref Range   Glucose-Capillary 121 (H) 70 - 99 mg/dL    Comment: Glucose reference range applies only to samples taken after fasting for at least 8 hours.  Glucose, capillary     Status: Abnormal   Collection Time: 06/09/24 11:44 AM  Result Value Ref Range   Glucose-Capillary 146 (H) 70 - 99 mg/dL    Comment: Glucose reference range applies only to samples taken after fasting for at least 8 hours.  Glucose, capillary     Status: Abnormal   Collection Time: 06/09/24  4:13 PM  Result Value Ref Range   Glucose-Capillary 134 (H) 70 - 99 mg/dL    Comment: Glucose reference range applies only to samples taken after fasting for at least 8 hours.  Glucose, capillary     Status: Abnormal   Collection Time: 06/09/24  7:54 PM  Result Value Ref Range   Glucose-Capillary 162 (H) 70 - 99 mg/dL    Comment: Glucose reference range applies only to samples taken after fasting for at least 8 hours.  Glucose, capillary     Status: Abnormal   Collection Time: 06/10/24 12:00 AM  Result Value Ref Range   Glucose-Capillary 153 (H) 70 - 99 mg/dL    Comment: Glucose reference range applies only to samples taken after fasting for at least 8 hours.  Heparin  level (unfractionated)     Status: None   Collection Time: 06/10/24  4:00 AM  Result Value Ref Range   Heparin  Unfractionated 0.47 0.30 - 0.70 IU/mL    Comment: (NOTE) The clinical reportable range upper limit is being lowered to >1.10 to align with the FDA approved guidance for the current  laboratory assay.  If heparin  results are below expected values, and patient dosage has  been confirmed, suggest follow up testing of antithrombin III levels. Performed at [facility], [address] [place], [place] [address]   Basic metabolic panel with GFR     Status: Abnormal   Collection Time: 06/10/24  4:00 AM  Result Value Ref Range   Sodium 138 135 - 145 mmol/L   Potassium 3.6 3.5 - 5.1 mmol/L   Chloride 108 98 - 111 mmol/L   CO2 23 22 - 32 mmol/L   Glucose, Bld 161 (H) 70 - 99 mg/dL    Comment: Glucose reference range applies only to samples taken after fasting for at least 8 hours.   BUN 29 (H) 8 - 23 mg/dL   Creatinine, Ser 9.59 (L) 0.44 - 1.00 mg/dL   Calcium  7.9 (L) 8.9 - 10.3 mg/dL   GFR, Estimated >39 >39 mL/min    Comment: (NOTE) Calculated using the CKD-EPI Creatinine Equation (2021)    Anion gap 7 5 - 15    Comment: Performed at [facility], [address] [place], [place] [address]  Glucose, capillary  Status: Abnormal   Collection Time: 06/10/24  4:10 AM  Result Value Ref Range   Glucose-Capillary 151 (H) 70 - 99 mg/dL    Comment: Glucose reference range applies only to samples taken after fasting for at least 8 hours.    Imaging / Studies: No results found.      Medications / Allergies: per chart  Antibiotics: Anti-infectives (From admission, onward)    Start     Dose/Rate Route Frequency Ordered Stop   06/09/24 1745  DAPTOmycin  (CUBICIN ) IVPB 500 mg/62mL premix        500 mg 100 mL/hr over 30 Minutes Intravenous Daily 06/09/24 1647     06/06/24 1000  micafungin  (MYCAMINE ) 100 mg in sodium chloride  0.9 % 100 mL IVPB        100 mg 105 mL/hr over 1 Hours Intravenous Every 24 hours 06/05/24 1349 06/20/24 0959   06/04/24 1400  piperacillin -tazobactam (ZOSYN ) IVPB 3.375 g        3.375 g 12.5 mL/hr over 240 Minutes Intravenous Every 8 hours 06/04/24 0738 06/09/24 2124   06/04/24 1400  DAPTOmycin   (CUBICIN ) IVPB 500 mg/27mL premix        8 mg/kg  60.9 kg 100 mL/hr over 30 Minutes Intravenous Daily 06/04/24 0738 06/10/24 0126   06/03/24 1000  neomycin  (MYCIFRADIN ) tablet 500 mg  Status:  Discontinued        500 mg Oral 3 times daily 06/03/24 0935 06/03/24 1555   06/03/24 1000  metroNIDAZOLE  (FLAGYL ) tablet 500 mg  Status:  Discontinued       Note to Pharmacy: Take 2 pills (=1000mg ) by mouth at 1pm, 3pm, and 10pm the day before your colorectal operation   500 mg Oral 3 times daily 06/03/24 0935 06/03/24 1555   05/29/24 1500  DAPTOmycin  (CUBICIN ) IVPB 500 mg/50mL premix  Status:  Discontinued        8 mg/kg  60.9 kg 100 mL/hr over 30 Minutes Intravenous Daily 05/29/24 1359 06/04/24 0738   05/28/24 2100  vancomycin  (VANCOREADY) IVPB 750 mg/150 mL  Status:  Discontinued        750 mg 150 mL/hr over 60 Minutes Intravenous Every 12 hours 05/28/24 1419 05/29/24 1359   05/27/24 0800  vancomycin  (VANCOCIN ) IVPB 1000 mg/200 mL premix  Status:  Discontinued        1,000 mg 200 mL/hr over 60 Minutes Intravenous Every 24 hours 05/26/24 1144 05/28/24 1419   05/23/24 1400  piperacillin -tazobactam (ZOSYN ) IVPB 3.375 g  Status:  Discontinued        3.375 g 12.5 mL/hr over 240 Minutes Intravenous Every 8 hours 05/23/24 0804 06/04/24 0738   05/23/24 1200  micafungin  (MYCAMINE ) 100 mg in sodium chloride  0.9 % 100 mL IVPB        100 mg 105 mL/hr over 1 Hours Intravenous Every 24 hours 05/23/24 0755 06/05/24 1221   05/22/24 0800  vancomycin  (VANCOCIN ) IVPB 1000 mg/200 mL premix  Status:  Discontinued        1,000 mg 200 mL/hr over 60 Minutes Intravenous Every 24 hours 05/21/24 0634 05/26/24 0806   05/21/24 0730  vancomycin  (VANCOREADY) IVPB 1250 mg/250 mL        1,250 mg 166.7 mL/hr over 90 Minutes Intravenous  Once 05/21/24 0634 05/21/24 1043   05/19/24 1400  piperacillin -tazobactam (ZOSYN ) IVPB 3.375 g  Status:  Discontinued        3.375 g 12.5 mL/hr over 240 Minutes Intravenous Every 8 hours  05/19/24 1303 05/23/24 0804   05/09/24 0900  erythromycin  250 mg in sodium chloride  0.9 % 100 mL IVPB        250 mg 100 mL/hr over 60 Minutes Intravenous Every 8 hours 05/09/24 0732 05/11/24 0044   05/08/24 2200  cefoTEtan  (CEFOTAN ) 2 g in sodium chloride  0.9 % 100 mL IVPB        2 g 200 mL/hr over 30 Minutes Intravenous Every 12 hours 05/08/24 1759 05/09/24 0749   05/08/24 1400  neomycin  (MYCIFRADIN ) tablet 1,000 mg  Status:  Discontinued       Placed in And Linked Group   1,000 mg Oral 3 times per day 05/08/24 1119 05/08/24 1120   05/08/24 1400  metroNIDAZOLE  (FLAGYL ) tablet 1,000 mg  Status:  Discontinued       Placed in And Linked Group   1,000 mg Oral 3 times per day 05/08/24 1119 05/08/24 1120   05/08/24 1130  cefoTEtan  (CEFOTAN ) 2 g in sodium chloride  0.9 % 100 mL IVPB        2 g 200 mL/hr over 30 Minutes Intravenous On call to O.R. 05/08/24 1119 05/09/24 0749         Note: Portions of this report may have been transcribed using voice recognition software. Every effort was made to ensure accuracy; however, inadvertent computerized transcription errors may be present.   Any transcriptional errors that result from this process are unintentional.    Elspeth KYM Schultze, MD, FACS, MASCRS Esophageal, Gastrointestinal & Colorectal Surgery Robotic and Minimally Invasive Surgery  [facility] [address], [address] [place], [place] [address] [phone] Fax [phone] Main  CONTACT INFORMATION: Weekday (9AM-5PM): Call [facility] main office at [phone] Weeknight (5PM-9AM) or Weekend/Holiday: Check EPIC Web Links tab & use AMION (password  [email or web address]) for General Surgery [facility] coverage  Please, DO NOT use SecureChat  (it is not reliable communication to reach operating surgeons & will lead to a delay in care).   Epic staff messaging available for outptient concerns needing 1-2 business day response.      06/10/2024   7:13 AM

## 2024-06-10 NOTE — Progress Notes (Signed)
 Speech Language Pathology Treatment: Dysphagia  Patient Details Name: Elizabeth Odom MRN: [id number] DOB: 18-Sep-1941 Today's Date: 06/10/2024 Time: 9160-9144 SLP Time Calculation (min) (ACUTE ONLY): 16 min  Assessment / Plan / Recommendation Clinical Impression  Pt seen for po tolerance given dietary advancement. CT abdomen with contrast pending. SLP did not observe pt with solids due to her pending test however she was observed with po soda.    Swallow subjectively appeared timely with clear voice throughout without multiple subswallows as observed during initial evaluation on 6/26.  She does demonstrate deep inhalation post-swallow of sequential thin boluses via straw which can increase her aspiration risk - but coughing or throat clearing noted.  No dysarthria present as well - thus recommend advance diet as surgery indicates. Will follow up x1 more due to pt's prolonged hospital coarse and deconditioning.   Pt now has PEG tube feedings in place.   Using teach back, pt educated to importance of intake and precautions.    HPI HPI: 83 y.o. female s/p rectosigmoid resection takedown on 5/29. Course complicated by A-fib with RVR and then hypotension/rectal bleeding and syncope on 05/11/2024.  Patient  had postop ileus needing NG tube and TPN.  Imaging on 05/19/2024 showed possible abscesses. S/P IR guided drains placed on 05/20/2024.  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  Swallow eval ordered. Pt is s/p PEG placement.  She required intubation for surgery 6/24 and was extubated 6/25.  Chest imaging showed Enteric tube with side hole just below the GE junction, recommend  advancement.     Mild prominence of the central pulmonary vasculature without overt  pulmonary edema.      SLP  Plan  Continue with current plan of care          Recommendations  Diet recommendations: Thin liquid Liquids provided via: Straw;Cup Medication Administration: Other (Comment) (as tolerated) Supervision: Patient able to self feed Compensations: Slow rate;Small sips/bites Postural Changes and/or Swallow Maneuvers: Seated upright 90 degrees                  Oral care BID;Staff/trained caregiver to provide oral care;Oral care before and after PO   Intermittent Supervision/Assistance Dysphagia, unspecified (R13.10)     Continue with current plan of care     Nicolas Emmie Caldron  06/10/2024, 10:59 AM

## 2024-06-10 NOTE — Progress Notes (Signed)
 PROGRESS NOTE  Elizabeth Odom FMW:[id number] DOB: 09-17-1941 DOA: 05/08/2024 PCP: Jesus Bernardino MATSU, MD   LOS: 33 days   Brief narrative:  Elizabeth Odom is a 83 y.o. female with a history of atrial fibrillation, DVT, PE, anemia, PACs, hypertension, mild cognitive impairment, history of large bowel obstruction status post colonic resection and colostomy creation last year.  Her clinical course was complicated by anastomotic leak in presented last month for a colostomy resection and takedown but had complicated postop course with dense adhesions.  Patient also had atrial fibrillation with RVR polypharmacy and altered mental status.  At this time patient has been in the hospital for intra-abdominal abscess with polymicrobial growth and has been having poor oral intake and on TPN.  Patient was taken back to the OR on 6/25 for lysis of adhesions colostomy placement and drainage of abscess.  Rectal tube was placed thin for drainage.  Patient also had G-tube placed in for nutrition.  Intraoperatively, patient had hypotension requiring vasopressors but this resolved and patient has been extubated as well.  At this time, patient has been considered for transfer out of the ICU  Sequence of events. 5/29 presented for elective colostomy takedown per Dr. Sheldon  6/10 Transgluteal drain placed per IR 6/17 CT guided drain placed into the abdominal and pelvic abscesses yielding pus and stool.   6/24 Back to the OR with Dr. Sheldon for lysis of adhesions, colostomy placement, drainage of abscess, and placement of G-tube, Intraoperatively she had episode of hypotension requiring vasopressors immediately upon insufflation to the abdomen. 6/25 extubated. 7/1- Repeat CT abdomen done  Assessment/Plan: Principal Problem:   Diverticular disease Active Problems:   A-fib (HCC)   Restless leg   ABLA (acute blood loss anemia)   Need for emotional support   Acute urinary retention   Mixed hyperlipidemia   Essential  hypertension   Diverticular disease of left colon   History of DVT (deep vein thrombosis)   History of pulmonary embolus (PE)   Pre-diabetes   Presence of IVC filter   Stricture of sigmoid colon (HCC)   Protein-calorie malnutrition, severe   Hypokalemia   Complex surgical abdomen POD 1 ex-lap for lysis of adhesions Diverting Transverse loop colostomy G tube placement Rectal tube placement (for abscess drainage) Continue TPN for nutrition.  Continue wound VAC and management as per surgery.  On G-tube feeding.   Functioning colostomy at this time.  Has been started on oral diet as well.  Still complains of significant pain.  General surgery has been repeated CT scan of the abdomen with multiple findings.  Intra-abdominal abscess Infectious disease on board.  Currently on  daptomycin , Zosyn , and micafungin .  CT scan of the abdomen done today with multiple findings.  Follow surgical recommendations.  Atrial Fibrillation On metoprolol  and heparin  drip.  Rate controlled.  Hypokalemia.  Improved after replacement.  Latest potassium 3.6   Severe protein calorie malnutrition Nutrition Status:Body mass index is 23.92 kg/m.  Nutrition Problem: Severe Malnutrition Etiology: chronic illness Signs/Symptoms: severe fat depletion, severe muscle depletion Interventions: Refer to RD note for recommendations, currently on TPN, tube feeds and oral diet.  Further plan as per surgery.  Debility deconditioning, frailty.    PT has recommended skilled nursing facility placement.    Goals of care.  Patient undergoing severe pain and deconditioning.  Palliative care on board regarding ongoing goals of care and symptom management.  DVT prophylaxis: SCD's Start: 05/08/24 1759   Disposition: Skilled nursing facility when okay with  general surgery.  Status is: Inpatient  Remains inpatient appropriate because: Pending clinical improvement, need for skilled nursing facility placement   Code Status:      Code Status: Full Code  Family Communication:  Spoke with the patient's granddaughter on the phone on 06/06/2024   Consultants: General Surgery PCCM Infectious disease Palliative care.  Procedures: 5/29 elective colostomy takedown per Dr. Sheldon  6/10 Transgluteal drain placed per IR 6/17 CT guided drain placed into the abdominal and pelvic abscesses yielding pus and stool.   6/24 Lysis of adhesions, colostomy placement, drainage of abscess, and placement of G-tube,  6/25 extubated.  Anti-infectives:  Zosyn  IV Daptomycin  IV Micafungin  IV  Anti-infectives (From admission, onward)    Start     Dose/Rate Route Frequency Ordered Stop   06/09/24 1745  DAPTOmycin  (CUBICIN ) IVPB 500 mg/47mL premix        500 mg 100 mL/hr over 30 Minutes Intravenous Daily 06/09/24 1647     06/06/24 1000  micafungin  (MYCAMINE ) 100 mg in sodium chloride  0.9 % 100 mL IVPB        100 mg 105 mL/hr over 1 Hours Intravenous Every 24 hours 06/05/24 1349 06/20/24 0959   06/04/24 1400  piperacillin -tazobactam (ZOSYN ) IVPB 3.375 g        3.375 g 12.5 mL/hr over 240 Minutes Intravenous Every 8 hours 06/04/24 0738 06/09/24 2124   06/04/24 1400  DAPTOmycin  (CUBICIN ) IVPB 500 mg/38mL premix        8 mg/kg  60.9 kg 100 mL/hr over 30 Minutes Intravenous Daily 06/04/24 0738 06/10/24 0126   06/03/24 1000  neomycin  (MYCIFRADIN ) tablet 500 mg  Status:  Discontinued        500 mg Oral 3 times daily 06/03/24 0935 06/03/24 1555   06/03/24 1000  metroNIDAZOLE  (FLAGYL ) tablet 500 mg  Status:  Discontinued       Note to Pharmacy: Take 2 pills (=1000mg ) by mouth at 1pm, 3pm, and 10pm the day before your colorectal operation   500 mg Oral 3 times daily 06/03/24 0935 06/03/24 1555   05/29/24 1500  DAPTOmycin  (CUBICIN ) IVPB 500 mg/50mL premix  Status:  Discontinued        8 mg/kg  60.9 kg 100 mL/hr over 30 Minutes Intravenous Daily 05/29/24 1359 06/04/24 0738   05/28/24 2100  vancomycin  (VANCOREADY) IVPB 750 mg/150 mL   Status:  Discontinued        750 mg 150 mL/hr over 60 Minutes Intravenous Every 12 hours 05/28/24 1419 05/29/24 1359   05/27/24 0800  vancomycin  (VANCOCIN ) IVPB 1000 mg/200 mL premix  Status:  Discontinued        1,000 mg 200 mL/hr over 60 Minutes Intravenous Every 24 hours 05/26/24 1144 05/28/24 1419   05/23/24 1400  piperacillin -tazobactam (ZOSYN ) IVPB 3.375 g  Status:  Discontinued        3.375 g 12.5 mL/hr over 240 Minutes Intravenous Every 8 hours 05/23/24 0804 06/04/24 0738   05/23/24 1200  micafungin  (MYCAMINE ) 100 mg in sodium chloride  0.9 % 100 mL IVPB        100 mg 105 mL/hr over 1 Hours Intravenous Every 24 hours 05/23/24 0755 06/05/24 1221   05/22/24 0800  vancomycin  (VANCOCIN ) IVPB 1000 mg/200 mL premix  Status:  Discontinued        1,000 mg 200 mL/hr over 60 Minutes Intravenous Every 24 hours 05/21/24 0634 05/26/24 0806   05/21/24 0730  vancomycin  (VANCOREADY) IVPB 1250 mg/250 mL        1,250 mg 166.7  mL/hr over 90 Minutes Intravenous  Once 05/21/24 0634 05/21/24 1043   05/19/24 1400  piperacillin -tazobactam (ZOSYN ) IVPB 3.375 g  Status:  Discontinued        3.375 g 12.5 mL/hr over 240 Minutes Intravenous Every 8 hours 05/19/24 1303 05/23/24 0804   05/09/24 0900  erythromycin  250 mg in sodium chloride  0.9 % 100 mL IVPB        250 mg 100 mL/hr over 60 Minutes Intravenous Every 8 hours 05/09/24 0732 05/11/24 0044   05/08/24 2200  cefoTEtan  (CEFOTAN ) 2 g in sodium chloride  0.9 % 100 mL IVPB        2 g 200 mL/hr over 30 Minutes Intravenous Every 12 hours 05/08/24 1759 05/09/24 0749   05/08/24 1400  neomycin  (MYCIFRADIN ) tablet 1,000 mg  Status:  Discontinued       Placed in And Linked Group   1,000 mg Oral 3 times per day 05/08/24 1119 05/08/24 1120   05/08/24 1400  metroNIDAZOLE  (FLAGYL ) tablet 1,000 mg  Status:  Discontinued       Placed in And Linked Group   1,000 mg Oral 3 times per day 05/08/24 1119 05/08/24 1120   05/08/24 1130  cefoTEtan  (CEFOTAN ) 2 g in sodium  chloride 0.9 % 100 mL IVPB        2 g 200 mL/hr over 30 Minutes Intravenous On call to O.R. 05/08/24 1119 05/09/24 0749      Subjective: Today, patient was seen and examined at bedside.  Patient continues to abdominal pain.  Denies any nausea vomiting fever chills.  Feels like she wants to drink.  Objective: Vitals:   06/09/24 1952 06/10/24 0409  BP: (!) 156/92 (!) 148/87  Pulse: 84 (!) 108  Resp: 16 14  Temp: 98 F (36.7 C) 98.7 F (37.1 C)  SpO2: 99% 97%    Intake/Output Summary (Last 24 hours) at 06/10/2024 1120 Last data filed at 06/10/2024 0622 Gross per 24 hour  Intake 1988.52 ml  Output 1980 ml  Net 8.52 ml   Filed Weights   06/08/24 0442 06/09/24 0500 06/10/24 0500  Weight: 64.8 kg 66.9 kg 66.6 kg   Body mass index is 24.43 kg/m.   Physical Exam:  General:  Average built, chronically ill and deconditioned, on nasal cannula oxygen HENT:   No scleral pallor or icterus noted. Oral mucosa is moist.  Chest:   Diminished breath sounds bilaterally CVS: S1 &S2 heard. No murmur.  Regular rate and rhythm. Abdomen: Soft, tenderness noted with gastrostomy tube, abdominal drain in place, urethral catheter in place, colostomy right upper quadrant which is functioning. Extremities: Right upper extremity PICC line in place. Psych: Alert, awake and oriented, normal mood CNS:  No cranial nerve deficits.  Generalized weakness noted Skin: Warm and dry.  Abdominal incision with dressing.   Data Review: I have personally reviewed the following laboratory data and studies,  CBC: Recent Labs  Lab 06/05/24 0429 06/06/24 0346 06/07/24 0423 06/08/24 0406 06/09/24 0406  WBC 13.5* 11.7* 13.1* 10.3 10.8*  HGB 7.1* 8.0* 8.6* 8.0* 7.4*  HCT 23.3* 25.8* 27.6* 26.0* 24.4*  MCV 95.5 90.5 89.9 92.5 92.1  PLT 361 353 380 319 351   Basic Metabolic Panel: Recent Labs  Lab 06/04/24 0609 06/05/24 0830 06/06/24 0346 06/07/24 0423 06/08/24 0406 06/09/24 0406 06/10/24 0400  NA 132*  138 135 133* 133* 134* 138  K 3.9 4.0 3.8 3.8 3.4* 3.9 3.6  CL 101 107 104 101 104 105 108  CO2 22 22 22  21* 22 22 23   GLUCOSE 151* 140* 121* 133* 124* 128* 161*  BUN 23 21 21 19 23 23  29*  CREATININE 0.77 0.55 0.61 0.47 0.43* 0.46 0.40*  CALCIUM  7.7* 8.0* 8.0* 7.8* 7.6* 7.7* 7.9*  MG 1.8 1.9 1.9 2.0  --  1.8  --   PHOS 2.8 1.9* 2.2* 2.5  --  2.7  --    Liver Function Tests: Recent Labs  Lab 06/04/24 0609 06/05/24 0830 06/09/24 0406  AST 34 26 22  ALT 23 21 21   ALKPHOS 129* 146* 196*  BILITOT 1.2 1.3* 1.4*  PROT 6.2* 6.4* 6.1*  ALBUMIN  1.7* 1.7* 1.5*   No results for input(s): LIPASE, AMYLASE in the last 168 hours. No results for input(s): AMMONIA in the last 168 hours. Cardiac Enzymes: Recent Labs  Lab 06/07/24 0423  CKTOTAL 19*    BNP (last 3 results) No results for input(s): BNP in the last 8760 hours.  ProBNP (last 3 results) No results for input(s): PROBNP in the last 8760 hours.  CBG: Recent Labs  Lab 06/09/24 1613 06/09/24 1954 06/10/24 0000 06/10/24 0410 06/10/24 0749  GLUCAP 134* 162* 153* 151* 148*   Recent Results (from the past 240 hours)  Surgical pcr screen     Status: Abnormal   Collection Time: 06/02/24  7:55 PM   Specimen: Nasal Mucosa; Nasal Swab  Result Value Ref Range Status   MRSA, PCR POSITIVE (A) NEGATIVE Final   Staphylococcus aureus POSITIVE (A) NEGATIVE Final    Comment: (NOTE) The Xpert SA Assay (FDA approved for NASAL specimens in patients 57 years of age and older), is one component of a comprehensive surveillance program. It is not intended to diagnose infection nor to guide or monitor treatment. Performed at [facility], [address] [place], [place] [address]      Studies: CT ABDOMEN PELVIS W CONTRAST Result Date: 06/10/2024 CLINICAL DATA:  Abdominal pain, postop status post diverting transverse colostomy with history of anastomotic leak EXAM: CT ABDOMEN AND PELVIS WITH CONTRAST  TECHNIQUE: Multidetector CT imaging of the abdomen and pelvis was performed using the standard protocol following bolus administration of intravenous contrast. RADIATION DOSE REDUCTION: This exam was performed according to the departmental dose-optimization program which includes automated exposure control, adjustment of the mA and/or kV according to patient size and/or use of iterative reconstruction technique. CONTRAST:  OMNIPAQUE  IOHEXOL  300 MG/ML  SOLN COMPARISON:  06/02/2024 FINDINGS: Lower chest: Moderate bilateral pleural effusions with dependent lower lobe atelectasis. Hepatobiliary: Gallbladder is decompressed, with no evidence of calcified gallstones or gallbladder wall thickening. The liver is unremarkable. No biliary duct dilation. Pancreas: Unremarkable. No pancreatic ductal dilatation or surrounding inflammatory changes. Spleen: Normal in size without focal abnormality. Adrenals/Urinary Tract: Stable punctate less than 2 mm nonobstructing left renal calculus. No right-sided calculi or obstruction. Stable minimally complex right renal cyst does not require specific follow-up. The adrenals are stable. The bladder is decompressed with a Foley catheter. Stomach/Bowel: There is a percutaneous gastrostomy tube identified within the lumen of the gastric antrum. Interval colostomy involving the proximal transverse colon within the right mid abdomen. Postsurgical changes from distal colectomy, with rectal stump identified. Interval removal of the pigtail drainage catheter previously seen within the lower pelvis/rectum. No bowel obstruction or ileus. There is nonspecific wall thickening of the transverse colon given decompressed state. There are multiple complex fluid collections within the abdomen. Along the ventral margin of the stomach there are 2 collections measuring 7.2 x 6.1 cm image  22/2, and 6.8 x 6.0 cm image 33/2, resulting in mass effect upon the stomach. The more inferior collection extends  inferiorly to the level of the umbilicus, with fluid-fluid levels and hyperdense material layering dependently in these collections, most prominently at the umbilicus measuring 3.5 x 4.4 cm reference image 50/2. This could reflect hematocrit level from previous hemorrhage, versus layering material from bowel contents given history of prior anastomosis breakdown. There is no evidence of contrast extravasation to suggest active hemorrhage. Vascular/Lymphatic: Aortic atherosclerosis. IVC filter again noted. No enlarged abdominal or pelvic lymph nodes. Reproductive: Status post hysterectomy. No adnexal masses. Other: In addition to the multiple fluid collections described above, there is a small amount of free fluid throughout the abdomen and pelvis, greatest along the inferior margin of the liver. There is no free intraperitoneal gas. Indwelling surgical drain identified entering via the left mid abdomen, tip in the left lower pelvis. Musculoskeletal: Diffuse subcutaneous edema. No acute or destructive bony abnormalities. Reconstructed images demonstrate no additional findings. IMPRESSION: 1. Multiple complex fluid collections within the upper abdomen as above, containing fluid-fluid levels as above. Differential would include hematocrit levels from prior hemorrhage, versus layering material from bowel contents given prior history of bowel perforation. Continued follow-up recommended. 2. Interval diverting transverse colostomy within the right mid abdomen. No bowel obstruction or ileus. 3. Nonspecific wall thickening of the transverse colon, which may be due to decompressed state. 4. Support devices as above. 5. Moderate bilateral pleural effusions with dependent lower lobe atelectasis. 6. Small volume free fluid, most pronounced in the right upper quadrant. 7. Diffuse body wall edema. Critical Value/emergent results were called by telephone at the time of interpretation on 06/10/2024 at 10:57 am to provider [facility], who verbally acknowledged these results. Electronically Signed   By: Ozell Daring M.D.   On: 06/10/2024 11:00     Vernal Alstrom, MD  [facility] 06/10/2024  If 7PM-7AM, please contact night-coverage

## 2024-06-10 NOTE — Progress Notes (Addendum)
 PHARMACY - TOTAL PARENTERAL NUTRITION CONSULT NOTE   Indication: Prolonged ileus  Patient Measurements: Height: 5' 5 (165.1 cm) Weight: 66.6 kg (146 lb 13.2 oz) IBW/kg (Calculated) : 57 TPN AdjBW (KG): 61.3 Body mass index is 24.43 kg/m.  Assessment: 48 yoF with history of diverticulitis with perforation and abscess. 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 She requested ostomy takedown and underwent LOA, colostomy takedown, small bowel resection with anastomosis on 5/29. Pharmacy is consulted to dose TPN starting 6/2 for postop ileus.  Post-op course complicated by anastomotic leak, multiple-intraabdominal abscesses. Patient is now s/p diverting loop transverse colostomy.  Glucose / Insulin : no Hx DM, A1c 5.1%. BG goal <180 -SSI discontinued on 6/11 -CBGs q4h < 150 Electrolytes: Na 134 (slightly low, stable), all others WNL Hepatic: albumin  low, Alk Phos slightley elevated -TG 174 (6/28) > 205 (6/30) I/O: -UOP: 1400 mL recorded this morning, lasix  IV x 1 -Stool: 150 ml -Drain: minimal GI Imaging:  -6/9 CT: Dempsey dehiscence along the rectal anastomosis, free air and multiple fluid collections -6/16 CT: Similar appearance at the rectal anastomosis concerning for dehiscence -6/23: poss catheter occlusion of PERC drain, several fistulous of sigmoid colon, gas collection, rectal anastomosis with dehiscence cavity communicating to small bowel, Multiple additional small loculated fluid collection at new percutaneous drainage catheter site. Additional small loculated fluid collection small bowel mesentery.  GI Surgeries / Procedures: -5/29: LAR, SBR, colostomy takedown, LOA -6/10 drains placed x 2 in IR (midline abd, transgluteal) -6/17: Replacement of drainage catheter by IR -6/24: diverting loop transverse colostomy, I&D of abdominal abscess, gastrotomy tube  placement, rectal tube placement.   Central access: Double lumen PICC placed 6/2 TPN start date: 6/2  Nutritional Goals: Goal TPN rate is 70 mL/hr (provides 94 g of protein and 1680 kcals per day)  RD Assessment:  Estimated Needs Total Energy Estimated Needs: 1600-1750 kcals Total Protein Estimated Needs: 80-95 grams Total Fluid Estimated Needs: >/= 1.6L  Current Nutrition:  TPN at goal rate 70 ml/hr  Diet: dysphagia 1 Multiple calorie counts this admission. Not meeting caloric needs. Refusing all supplements. Pt underwent G-tube placement 6/24. Tube feeds started 6/26, stopped for abdominal distention and discomfort. G tube to suction, has been clamped. 6/30 Tube feeds started 7/1 Tolerating tube feeds at goal, plan to start cycling feeds with goal to do bolus feeds  Plan:  Okay to wean off TPN per consult from [facility] Decrease TPN to half rate 35 mL/hr for 2-3 hours and then discontinue - no further TPN orders Pharmacy to sign off TPN consult  [name], PharmD, BCPS Clinical Pharmacist 06/10/2024 9:02 AM

## 2024-06-10 NOTE — Plan of Care (Signed)
  Problem: Education: Goal: Verbalization of understanding of the causes of altered bowel function will improve Outcome: Progressing   Problem: Bowel/Gastric: Goal: Gastrointestinal status for postoperative course will improve Outcome: Progressing   Problem: Health Behavior/Discharge Planning: Goal: Identification of community resources to assist with postoperative recovery needs will improve Outcome: Progressing   Problem: Nutritional: Goal: Will attain and maintain optimal nutritional status will improve Outcome: Progressing   Problem: Clinical Measurements: Goal: Postoperative complications will be avoided or minimized Outcome: Progressing   Problem: Respiratory: Goal: Respiratory status will improve Outcome: Progressing   Problem: Skin Integrity: Goal: Will show signs of wound healing Outcome: Progressing   Problem: Education: Goal: Knowledge of General Education information will improve Description: Including pain rating scale, medication(s)/side effects and non-pharmacologic comfort measures Outcome: Progressing   Problem: Health Behavior/Discharge Planning: Goal: Ability to manage health-related needs will improve Outcome: Progressing   Problem: Clinical Measurements: Goal: Ability to maintain clinical measurements within normal limits will improve Outcome: Progressing Goal: Will remain free from infection Outcome: Progressing Goal: Diagnostic test results will improve Outcome: Progressing Goal: Respiratory complications will improve Outcome: Progressing Goal: Cardiovascular complication will be avoided Outcome: Progressing   Problem: Activity: Goal: Risk for activity intolerance will decrease Outcome: Progressing   Problem: Coping: Goal: Level of anxiety will decrease Outcome: Progressing   Problem: Elimination: Goal: Will not experience complications related to bowel motility Outcome: Progressing Goal: Will not experience complications related to  urinary retention Outcome: Progressing   Problem: Pain Managment: Goal: General experience of comfort will improve and/or be controlled Outcome: Progressing   Problem: Safety: Goal: Ability to remain free from injury will improve Outcome: Progressing   Problem: Skin Integrity: Goal: Risk for impaired skin integrity will decrease Outcome: Progressing   Problem: Education: Goal: Ability to describe self-care measures that may prevent or decrease complications (Diabetes Survival Skills Education) will improve Outcome: Progressing Goal: Individualized Educational Video(s) Outcome: Progressing   Problem: Coping: Goal: Ability to adjust to condition or change in health will improve Outcome: Progressing   Problem: Fluid Volume: Goal: Ability to maintain a balanced intake and output will improve Outcome: Progressing   Problem: Health Behavior/Discharge Planning: Goal: Ability to identify and utilize available resources and services will improve Outcome: Progressing Goal: Ability to manage health-related needs will improve Outcome: Progressing   Problem: Metabolic: Goal: Ability to maintain appropriate glucose levels will improve Outcome: Progressing   Problem: Nutritional: Goal: Maintenance of adequate nutrition will improve Outcome: Progressing Goal: Progress toward achieving an optimal weight will improve Outcome: Progressing   Problem: Skin Integrity: Goal: Risk for impaired skin integrity will decrease Outcome: Progressing   Problem: Tissue Perfusion: Goal: Adequacy of tissue perfusion will improve Outcome: Progressing   Problem: Activity: Goal: Ability to tolerate increased activity will improve Outcome: Progressing   Problem: Respiratory: Goal: Ability to maintain a clear airway and adequate ventilation will improve Outcome: Progressing   Problem: Role Relationship: Goal: Method of communication will improve Outcome: Progressing

## 2024-06-11 ENCOUNTER — Inpatient Hospital Stay (HOSPITAL_COMMUNITY)

## 2024-06-11 DIAGNOSIS — K5790 Diverticulosis of intestine, part unspecified, without perforation or abscess without bleeding: Secondary | ICD-10-CM | POA: Diagnosis not present

## 2024-06-11 DIAGNOSIS — D62 Acute posthemorrhagic anemia: Secondary | ICD-10-CM | POA: Diagnosis not present

## 2024-06-11 DIAGNOSIS — I4819 Other persistent atrial fibrillation: Secondary | ICD-10-CM | POA: Diagnosis not present

## 2024-06-11 LAB — CBC
HCT: 21 % — ABNORMAL LOW (ref 36.0–46.0)
Hemoglobin: 6.2 g/dL — CL (ref 12.0–15.0)
MCH: 27.9 pg (ref 26.0–34.0)
MCHC: 29.5 g/dL — ABNORMAL LOW (ref 30.0–36.0)
MCV: 94.6 fL (ref 80.0–100.0)
Platelets: 389 10*3/uL (ref 150–400)
RBC: 2.22 MIL/uL — ABNORMAL LOW (ref 3.87–5.11)
RDW: 19.2 % — ABNORMAL HIGH (ref 11.5–15.5)
WBC: 15.7 10*3/uL — ABNORMAL HIGH (ref 4.0–10.5)
nRBC: 1 % — ABNORMAL HIGH (ref 0.0–0.2)

## 2024-06-11 LAB — PROTIME-INR
INR: 1.2 (ref 0.8–1.2)
Prothrombin Time: 15.5 s — ABNORMAL HIGH (ref 11.4–15.2)

## 2024-06-11 LAB — PREPARE RBC (CROSSMATCH)

## 2024-06-11 LAB — GLUCOSE, CAPILLARY
Glucose-Capillary: 106 mg/dL — ABNORMAL HIGH (ref 70–99)
Glucose-Capillary: 120 mg/dL — ABNORMAL HIGH (ref 70–99)
Glucose-Capillary: 140 mg/dL — ABNORMAL HIGH (ref 70–99)
Glucose-Capillary: 87 mg/dL (ref 70–99)
Glucose-Capillary: 92 mg/dL (ref 70–99)
Glucose-Capillary: 99 mg/dL (ref 70–99)

## 2024-06-11 LAB — HEMOGLOBIN AND HEMATOCRIT, BLOOD
HCT: 19.7 % — ABNORMAL LOW (ref 36.0–46.0)
Hemoglobin: 6 g/dL — CL (ref 12.0–15.0)

## 2024-06-11 LAB — HEPARIN LEVEL (UNFRACTIONATED): Heparin Unfractionated: 0.47 [IU]/mL (ref 0.30–0.70)

## 2024-06-11 MED ORDER — FENTANYL CITRATE (PF) 100 MCG/2ML IJ SOLN
INTRAMUSCULAR | Status: AC
  Filled 2024-06-11: qty 2

## 2024-06-11 MED ORDER — FENTANYL CITRATE (PF) 100 MCG/2ML IJ SOLN
INTRAMUSCULAR | Status: AC | PRN
  Administered 2024-06-11: 25 ug via INTRAVENOUS

## 2024-06-11 MED ORDER — SODIUM CHLORIDE (PF) 0.9 % IJ SOLN
INTRAMUSCULAR | Status: AC
  Filled 2024-06-11: qty 50

## 2024-06-11 MED ORDER — ALBUMIN HUMAN 5 % IV SOLN
25.0000 g | Freq: Once | INTRAVENOUS | Status: AC
  Administered 2024-06-11: 25 g via INTRAVENOUS
  Filled 2024-06-11: qty 500

## 2024-06-11 MED ORDER — LIDOCAINE HCL 1 % IJ SOLN
INTRAMUSCULAR | Status: AC | PRN
  Administered 2024-06-11: 10 mL via INTRADERMAL

## 2024-06-11 MED ORDER — OSMOLITE 1.5 CAL PO LIQD
1000.0000 mL | ORAL | Status: DC
  Administered 2024-06-11: 1000 mL
  Filled 2024-06-11: qty 1000

## 2024-06-11 MED ORDER — MIDAZOLAM HCL 2 MG/2ML IJ SOLN
INTRAMUSCULAR | Status: AC | PRN
  Administered 2024-06-11: .5 mg via INTRAVENOUS

## 2024-06-11 MED ORDER — PIPERACILLIN-TAZOBACTAM 3.375 G IVPB
3.3750 g | Freq: Three times a day (TID) | INTRAVENOUS | Status: DC
  Administered 2024-06-11 – 2024-06-15 (×13): 3.375 g via INTRAVENOUS
  Filled 2024-06-11 (×13): qty 50

## 2024-06-11 MED ORDER — SODIUM CHLORIDE 0.9 % IV SOLN
INTRAVENOUS | Status: AC | PRN
  Administered 2024-06-11: 20 mL/h via INTRAVENOUS

## 2024-06-11 MED ORDER — SODIUM CHLORIDE 0.9% IV SOLUTION: Freq: Once | INTRAVENOUS | Status: AC

## 2024-06-11 MED ORDER — MIDAZOLAM HCL 2 MG/2ML IJ SOLN
INTRAMUSCULAR | Status: AC
  Filled 2024-06-11: qty 2

## 2024-06-11 MED ORDER — MIDAZOLAM HCL 2 MG/2ML IJ SOLN
INTRAMUSCULAR | Status: AC | PRN
Start: 2024-06-11 — End: 2024-06-11
  Administered 2024-06-11: .5 mg via INTRAVENOUS

## 2024-06-11 MED ORDER — IOHEXOL 300 MG/ML  SOLN
75.0000 mL | Freq: Once | INTRAMUSCULAR | Status: AC | PRN
  Administered 2024-06-11: 75 mL via INTRAVENOUS

## 2024-06-11 MED ORDER — SODIUM CHLORIDE 0.9 % IV SOLN
INTRAVENOUS | Status: AC
  Filled 2024-06-11: qty 250

## 2024-06-11 NOTE — Progress Notes (Signed)
 Patient returned from Interventional Radiology. Noted to have large amount of bright red bleeding from rectum with multiple dime to nickel size clots. Dr. Sammy, Dr. Briana, and Dr, Aron made aware. New orders noted.

## 2024-06-11 NOTE — Progress Notes (Signed)
 Patient noted to have a large liquid stool from rectum with bright red blood and small pea to dime size clots

## 2024-06-11 NOTE — Progress Notes (Signed)
   06/11/24 1315  Vitals  Temp 97.6 F (36.4 C)  Temp Source Oral  BP (!) 150/79  MAP (mmHg) 101  BP Location Left Arm  BP Method Automatic  Patient Position (if appropriate) Lying  Pulse Rate (!) 118  Pulse Rate Source Monitor  Level of Consciousness  Level of Consciousness Alert  MEWS COLOR  MEWS Score Color Yellow  Oxygen Therapy  SpO2 98 %  O2 Device Nasal Cannula  O2 Flow Rate (L/min) 6 L/min  MEWS Score  MEWS Temp 0  MEWS Systolic 0  MEWS Pulse 2  MEWS RR 0  MEWS LOC 0  MEWS Score 2  Provider Notification  Provider Name/Title Dr. Briana  Date Provider Notified 06/11/24  Time Provider Notified 1320  Method of Notification Page (secure chat)  Notification Reason Other (Comment) (MEWS)  Date of Provider Response 06/11/24  Time of Provider Response 1320

## 2024-06-11 NOTE — Plan of Care (Signed)
  Problem: Education: Goal: Verbalization of understanding of the causes of altered bowel function will improve Outcome: Progressing   Problem: Bowel/Gastric: Goal: Gastrointestinal status for postoperative course will improve Outcome: Progressing   Problem: Health Behavior/Discharge Planning: Goal: Identification of community resources to assist with postoperative recovery needs will improve Outcome: Progressing   Problem: Nutritional: Goal: Will attain and maintain optimal nutritional status will improve Outcome: Progressing   Problem: Clinical Measurements: Goal: Postoperative complications will be avoided or minimized Outcome: Progressing   Problem: Respiratory: Goal: Respiratory status will improve Outcome: Progressing   Problem: Skin Integrity: Goal: Will show signs of wound healing Outcome: Progressing   Problem: Education: Goal: Knowledge of General Education information will improve Description: Including pain rating scale, medication(s)/side effects and non-pharmacologic comfort measures Outcome: Progressing   Problem: Health Behavior/Discharge Planning: Goal: Ability to manage health-related needs will improve Outcome: Progressing   Problem: Clinical Measurements: Goal: Ability to maintain clinical measurements within normal limits will improve Outcome: Progressing Goal: Will remain free from infection Outcome: Progressing Goal: Diagnostic test results will improve Outcome: Progressing Goal: Respiratory complications will improve Outcome: Progressing Goal: Cardiovascular complication will be avoided Outcome: Progressing   Problem: Activity: Goal: Risk for activity intolerance will decrease Outcome: Progressing   Problem: Coping: Goal: Level of anxiety will decrease Outcome: Progressing   Problem: Elimination: Goal: Will not experience complications related to bowel motility Outcome: Progressing Goal: Will not experience complications related to  urinary retention Outcome: Progressing   Problem: Pain Managment: Goal: General experience of comfort will improve and/or be controlled Outcome: Progressing   Problem: Safety: Goal: Ability to remain free from injury will improve Outcome: Progressing   Problem: Skin Integrity: Goal: Risk for impaired skin integrity will decrease Outcome: Progressing   Problem: Education: Goal: Ability to describe self-care measures that may prevent or decrease complications (Diabetes Survival Skills Education) will improve Outcome: Progressing Goal: Individualized Educational Video(s) Outcome: Progressing   Problem: Coping: Goal: Ability to adjust to condition or change in health will improve Outcome: Progressing   Problem: Fluid Volume: Goal: Ability to maintain a balanced intake and output will improve Outcome: Progressing   Problem: Health Behavior/Discharge Planning: Goal: Ability to identify and utilize available resources and services will improve Outcome: Progressing Goal: Ability to manage health-related needs will improve Outcome: Progressing   Problem: Metabolic: Goal: Ability to maintain appropriate glucose levels will improve Outcome: Progressing   Problem: Nutritional: Goal: Maintenance of adequate nutrition will improve Outcome: Progressing Goal: Progress toward achieving an optimal weight will improve Outcome: Progressing   Problem: Skin Integrity: Goal: Risk for impaired skin integrity will decrease Outcome: Progressing   Problem: Tissue Perfusion: Goal: Adequacy of tissue perfusion will improve Outcome: Progressing   Problem: Activity: Goal: Ability to tolerate increased activity will improve Outcome: Progressing   Problem: Respiratory: Goal: Ability to maintain a clear airway and adequate ventilation will improve Outcome: Progressing   Problem: Role Relationship: Goal: Method of communication will improve Outcome: Progressing

## 2024-06-11 NOTE — Progress Notes (Signed)
 PT Cancellation Note  Patient Details Name: Elizabeth Odom MRN: [id number] DOB: September 15, 1941   Cancelled Treatment:    Reason Eval/Treat Not Completed: Other (comment) Attempted to see pt for PT tx. Pt received in bed, appearing to not feel well. Pt declined participation on this date, noting pain in belly & legs - nursing staff notified of pt's request for pain meds. Encouraged pt to reconsider going to SNF at d/c but pt declines. Will continue efforts as able.  Elizabeth Odom, PT, DPT 06/11/24, 1:41 PM   Elizabeth Odom 06/11/2024, 1:40 PM

## 2024-06-11 NOTE — Progress Notes (Signed)
 PROGRESS NOTE    Elizabeth Odom  FMW:[id number] DOB: [birth year] DOA: 05/08/2024 PCP: Jesus Bernardino MATSU, MD   Brief Narrative: Elizabeth Odom is a 83 y.o. female with a history of atrial fibrillation, DVT, PE, anemia, PACs, hypertension, mild cognitive impairment, large bowel obstruction. Patient initially presented for surgical management involving history of diverticulitis with planned ostomy takedown with anastomosis. During hospitalization, patient developed altered mental status secondary to polypharmacy in addition to atrial fibrillation with RVR. Cardiology consulted for recommendation and management with subsequent improvement in RVR. On 6/24, patient underwent repeat laparoscopy converted to laparotomy with diverting loops transverse colostomy performed, in addition to drainage of an abdominal abscess and gastrostomy/rectal tube placement. Patient required admission to ICU post-operatively for respiratory failure but was extubated successfully on 6/25 and subsequent transferred out of ICU.    Assessment and Plan:  Persistent atrial fibrillation/flutter Cardiology consulted for management.  Medication adjusted to metoprolol  XL 25 mg daily with recommendation for Eliquis  when okay per surgery. Toprol -XL decreased to 12.5 mg daily. Eliquis  held and transitioned to heparin  IV. Heparin  now held secondary to acute anemia with concern for hematoma. -Continue Toprol -XL 12.5 mg daily  History of diverticulitis with perforation and abscess with colostomy status post ostomy takedown and re-do Ileus Per primary.  Infectious diseases consulted and are planning for management with multi week course of antibiotics.  Urine culture (6/12) significant for Pseudomonas, MRSA, Enterococcus faecalis, Candida albicans.  Drain culture (6/17) significant for rare Pseudomonas aeruginosa.  Ongoing antibiotics per infectious disease.  Patient now on daptomycin , micafungin , Zosyn . Patient underwent diverting  colostomy on 6/24 with gastrotomy tube placement. TPN weaned off. -Continue tube feeds -Ongoing general surgery recommendations  Intraabdominal abscess Infectious disease consulted. Culture data is polymicrobial and evident for pseudomonas aeruginosa, staphylococcus aureus and candida albicans. -ID recommendations: Zosyn , daptomycin , micafungin   Acute blood loss anemia Initially secondary to surgery, requiring multiple transfusions. Patient with recurrent significant anemia with concern for intraabdominal abscess vs hematoma. Hemoglobin down to 6.2 today. 1 unit of PRBC ordered. -Follow-up post-transfusion H&H  AKI Mild. Resolved.  Hypoalbuminemia Noted. Associated poor nutrition.  Leukocytosis In setting of surgery. Resolved.  Thrombocytosis Noted. Likely reactive. Mildly elevated and stable.  Hyponatremia Mild.  Syncope Syncopal episode occurred during hospitalization while patient was on commode.  Concern for possible vasovagal versus hypotension etiology.  Transthoracic echo obtained with evidence of preserved LVEF of 60 to 65% in addition to no aortic stenosis.  Acute urinary retention Foley inserted on 6/4. Flomax  started. Foley removed on 6/13. -Continue Flomax   Acute metabolic encephalopathy Delirium Resolved.  Generalized weakness Physical therapy is ordered already.  Discussed with patient that is important for her to participate as much as possible with therapy to improve her strength.   DVT prophylaxis: Heparin  IV (held) Code Status:   Code Status: Full Code Family Communication: None at bedside Disposition Plan: Discharge pending continued general surgery and ID recommendations. Anticipate discharge to SNF when medically ready.   Consultants:  General surgery PCCM Infectious disease  Procedures:    Antimicrobials:     Subjective: No events noted from overnight.  Objective: BP 133/70 (BP Location: Left Arm)   Pulse (!) 101   Temp 98.4 F  (36.9 C) (Oral)   Resp 16   Ht 5' 5 (1.651 m)   Wt 66.5 kg   SpO2 99%   BMI 24.41 kg/m   Examination:  General exam: Appears calm and comfortable Respiratory system: Respiratory effort normal.    Data Reviewed:  I have personally reviewed following labs and imaging studies  CBC Lab Results  Component Value Date   WBC 15.7 (H) 06/11/2024   RBC 2.22 (L) 06/11/2024   HGB 6.2 (LL) 06/11/2024   HCT 21.0 (L) 06/11/2024   MCV 94.6 06/11/2024   MCH 27.9 06/11/2024   PLT 389 06/11/2024   MCHC 29.5 (L) 06/11/2024   RDW 19.2 (H) 06/11/2024   LYMPHSABS 0.7 05/09/2024   MONOABS 1.8 (H) 05/09/2024   EOSABS 0.0 05/09/2024   BASOSABS 0.0 05/09/2024     Last metabolic panel Lab Results  Component Value Date   NA 138 06/10/2024   K 3.6 06/10/2024   CL 108 06/10/2024   CO2 23 06/10/2024   BUN 29 (H) 06/10/2024   CREATININE 0.40 (L) 06/10/2024   GLUCOSE 161 (H) 06/10/2024   GFRNONAA >60 06/10/2024   GFRAA >60 04/10/2016   CALCIUM  7.9 (L) 06/10/2024   PHOS 2.7 06/09/2024   PROT 6.1 (L) 06/09/2024   ALBUMIN  1.5 (L) 06/09/2024   LABGLOB 2.9 04/08/2024   BILITOT 1.4 (H) 06/09/2024   ALKPHOS 196 (H) 06/09/2024   AST 22 06/09/2024   ALT 21 06/09/2024   ANIONGAP 7 06/10/2024    GFR: Estimated Creatinine Clearance: 47.9 mL/min (A) (by C-G formula based on SCr of 0.4 mg/dL (L)).  Recent Results (from the past 240 hours)  Surgical pcr screen     Status: Abnormal   Collection Time: 06/02/24  7:55 PM   Specimen: Nasal Mucosa; Nasal Swab  Result Value Ref Range Status   MRSA, PCR POSITIVE (A) NEGATIVE Final   Staphylococcus aureus POSITIVE (A) NEGATIVE Final    Comment: (NOTE) The Xpert SA Assay (FDA approved for NASAL specimens in patients 100 years of age and older), is one component of a comprehensive surveillance program. It is not intended to diagnose infection nor to guide or monitor treatment. Performed at [facility], [address] [place], [place] [address]       Radiology Studies: CT ABDOMEN PELVIS W CONTRAST Result Date: 06/10/2024 CLINICAL DATA:  Abdominal pain, postop status post diverting transverse colostomy with history of anastomotic leak EXAM: CT ABDOMEN AND PELVIS WITH CONTRAST TECHNIQUE: Multidetector CT imaging of the abdomen and pelvis was performed using the standard protocol following bolus administration of intravenous contrast. RADIATION DOSE REDUCTION: This exam was performed according to the departmental dose-optimization program which includes automated exposure control, adjustment of the mA and/or kV according to patient size and/or use of iterative reconstruction technique. CONTRAST:  OMNIPAQUE  IOHEXOL  300 MG/ML  SOLN COMPARISON:  06/02/2024 FINDINGS: Lower chest: Moderate bilateral pleural effusions with dependent lower lobe atelectasis. Hepatobiliary: Gallbladder is decompressed, with no evidence of calcified gallstones or gallbladder wall thickening. The liver is unremarkable. No biliary duct dilation. Pancreas: Unremarkable. No pancreatic ductal dilatation or surrounding inflammatory changes. Spleen: Normal in size without focal abnormality. Adrenals/Urinary Tract: Stable punctate less than 2 mm nonobstructing left renal calculus. No right-sided calculi or obstruction. Stable minimally complex right renal cyst does not require specific follow-up. The adrenals are stable. The bladder is decompressed with a Foley catheter. Stomach/Bowel: There is a percutaneous gastrostomy tube identified within the lumen of the gastric antrum. Interval colostomy involving the proximal transverse colon within the right mid abdomen. Postsurgical changes from distal colectomy, with rectal stump identified. Interval removal of the pigtail drainage catheter previously seen within the lower pelvis/rectum. No bowel obstruction or ileus. There is nonspecific wall thickening of the transverse colon given decompressed state.  There  are multiple complex fluid collections within the abdomen. Along the ventral margin of the stomach there are 2 collections measuring 7.2 x 6.1 cm image 22/2, and 6.8 x 6.0 cm image 33/2, resulting in mass effect upon the stomach. The more inferior collection extends inferiorly to the level of the umbilicus, with fluid-fluid levels and hyperdense material layering dependently in these collections, most prominently at the umbilicus measuring 3.5 x 4.4 cm reference image 50/2. This could reflect hematocrit level from previous hemorrhage, versus layering material from bowel contents given history of prior anastomosis breakdown. There is no evidence of contrast extravasation to suggest active hemorrhage. Vascular/Lymphatic: Aortic atherosclerosis. IVC filter again noted. No enlarged abdominal or pelvic lymph nodes. Reproductive: Status post hysterectomy. No adnexal masses. Other: In addition to the multiple fluid collections described above, there is a small amount of free fluid throughout the abdomen and pelvis, greatest along the inferior margin of the liver. There is no free intraperitoneal gas. Indwelling surgical drain identified entering via the left mid abdomen, tip in the left lower pelvis. Musculoskeletal: Diffuse subcutaneous edema. No acute or destructive bony abnormalities. Reconstructed images demonstrate no additional findings. IMPRESSION: 1. Multiple complex fluid collections within the upper abdomen as above, containing fluid-fluid levels as above. Differential would include hematocrit levels from prior hemorrhage, versus layering material from bowel contents given prior history of bowel perforation. Continued follow-up recommended. 2. Interval diverting transverse colostomy within the right mid abdomen. No bowel obstruction or ileus. 3. Nonspecific wall thickening of the transverse colon, which may be due to decompressed state. 4. Support devices as above. 5. Moderate bilateral pleural effusions with  dependent lower lobe atelectasis. 6. Small volume free fluid, most pronounced in the right upper quadrant. 7. Diffuse body wall edema. Critical Value/emergent results were called by telephone at the time of interpretation on 06/10/2024 at 10:57 am to provider [facility], who verbally acknowledged these results. Electronically Signed   By: Ozell Daring M.D.   On: 06/10/2024 11:00       LOS: 34 days    Elgin Lam, MD [facility] 06/11/2024, 7:43 AM   If 7PM-7AM, please contact night-coverage [email or web address]

## 2024-06-11 NOTE — Progress Notes (Signed)
 PHARMACY - ANTICOAGULATION CONSULT NOTE  Pharmacy Consult for heparin  Indication: atrial fibrillation (apixaban  on hold)  Allergies  Allergen Reactions   Elemental Sulfur Anaphylaxis    Patient Measurements: Height: 5' 5 (165.1 cm) Weight: 66.5 kg (146 lb 11.1 oz) IBW/kg (Calculated) : 57 HEPARIN  DW (KG): 61.3  Vital Signs: Temp: 98.4 F (36.9 C) (07/02 0453) Temp Source: Oral (07/02 0453) BP: 133/70 (07/02 0453) Pulse Rate: 101 (07/02 0453)  Labs: Recent Labs    06/09/24 0406 06/10/24 0400 06/11/24 0309  HGB 7.4*  --  6.2*  HCT 24.4*  --  21.0*  PLT 351  --  389  LABPROT  --   --  15.5*  INR  --   --  1.2  HEPARINUNFRC 0.36 0.47 0.47  CREATININE 0.46 0.40*  --     Estimated Creatinine Clearance: 47.9 mL/min (A) (by C-G formula based on SCr of 0.4 mg/dL (L)).  Medications: Pt was not on anticoagulants PTA, however, apixaban  was started during admission for atrial fibrillation.  -Apixaban  2.5 mg PO BID, last dose given 6/23 @ 21:30  Assessment: Pt is an 60 yoF who was started on apixaban  during this admission for atrial fibrillation. Pt underwent surgery on 6/24 - placement of G-tube, I&D of abscess, diverting colostomy. [facility] recommending heparin  drip post-op until pt having BM.   Pharmacy consulted to dose heparin  for afib while apixaban  on hold.   06/11/2024 -Heparin  level 0.47 - remains therapeutic on 1550 units/hr -Hg 6.2 - trending down, transfuse 1u PRBC today -Concern for possible post op hematomas per [facility] note  Goal of Therapy:  Heparin  level 0.3-0.7 units/ml aPTT 66-102 seconds Monitor platelets by anticoagulation protocol: Yes   Plan:  -Per my discussion with [facility], stop heparin  for now -Plan relayed to RN - heparin  already stopped this morning per verbal from surgeon -All orders discontinued for clarity - please re-consult pharmacy as appropriate once safe to resume heparin    [name], PharmD, BCPS Clinical Pharmacist 06/11/2024 8:45  AM

## 2024-06-11 NOTE — Progress Notes (Signed)
 06/11/2024  Elizabeth Odom [id number] 1941-11-12  CARE TEAM: PCP: Jesus Bernardino MATSU, MD  Outpatient Care Team: Patient Care Team: Jesus Bernardino MATSU, MD as PCP - General (Internal Medicine) [name], MD as Consulting Physician (Gastroenterology) Rubin Calamity, MD as Consulting Physician (General Surgery) [name], MD (Neurology) [name], MD as Consulting Physician (Urology) [name], MD as Consulting Physician (Cardiology) [name], MD as Consulting Physician (Colon and Rectal Surgery)  Inpatient Treatment Team: Treatment Team:  [name], MD Nikki Hansel Atlas, MD Dennise Kingsley, MD [name], RN Rosan Deward ORN, NP Sonjia Held, MD [name], MD Lovetta Lenis, NT Suttle, Ester PARAS, MD Allred, Harman POUR, PA-C Izola Brodie, RN Bobbette Clause, MD Durenda Rolin GAILS, NT [place], [place], RPH [place], Candida H, RN Cleotilde Richerd HERO, PT Kriste Asberry BRAVO, RN   Problem List:   Principal Problem:   Diverticular disease Active Problems:   A-fib [facility])   Restless leg   ABLA (acute blood loss anemia)   Need for emotional support   Acute urinary retention   Mixed hyperlipidemia   Essential hypertension   Diverticular disease of left colon   History of DVT (deep vein thrombosis)   History of pulmonary embolus (PE)   Pre-diabetes   Presence of IVC filter   Stricture of sigmoid colon (HCC)   Protein-calorie malnutrition, severe   Hypokalemia   S/P percutaneous endoscopic gastrostomy (PEG) tube placement (HCC)   Goals of care, counseling/discussion   05/08/2024  POST-OPERATIVE DIAGNOSIS:   COLOSTOMY FOR RESECTION, DESIRE FOR OSTOMY TAKEDOWN RECTAL STRICTURE HISTORY OF DIVERTICULITIS WITH PERFORATION & ABSCESS   PROCEDURE:   -ROBOTIC RECTOSIGMOID RESECTION (LAR) -TAKEDOWN OF END COLOSTOMY WITH ANASTOMOSIS -RESECTION OF SMALL INTESTINE WITH ANASTOMOSIS -SMALL BOWEL REPAIR -LYSIS OF ADHESIONS x 115  MINUTES (66% OF CASE),  -INTRAOPERATIVE ASSESSMENT OF TISSUE VASCULAR PERFUSION USING ICG (indocyanine green ) IMMUNOFLUORESCENCE,  -TRANSVERSUS ABDOMINIS PLANE (TAP) BLOCK - BILATERAL -FLEXIBLE SIGMOIDOSCOPY   SURGEON:  [name], MD   05/20/2024  Post procedural Dx: Post op abscess, multiple   Technically successful CT guided placed of a  10 Fr drainage catheter placement x 2  into the midline ventral and dorsal perirectal abscesses.     [name] LABOR, MD    05/28/2024  IMPRESSION: Satisfactory removal of the midline anterior abscess drainage catheter and placement of a new midline anterior pelvic drainage catheter as well as a posterior transgluteal drainage catheter in pelvic abscesses.     Electronically Signed   By: [name]    06/03/2024  POST-OPERATIVE DIAGNOSIS:   DELAYED COLORECTAL ANASTOMOTIC LEAK, NEED FOR FECAL DIVERSION CHRONIC PELVIC ABSCESS   PROCEDURE:   DIAGNOSTIC LAPAROSCOPY CONVERTED TO LAPAROTOMY DIVERTING LOOP TRANSVERSE COLOSTOMY DRAINAGE OF ABDOMINAL ABSCESS GASTROSTOMY TUBE PLACEMENT RECTAL TUBE PLACEMENT   SURGEON:  [name], MD   OR FINDINGS: Very dense concrete intraperitoneal and pelvic adhesions infraumbilically.  Bladder, small bowel, omentum walling off the lower pelvis with delayed anastomotic leak.   Near disruption of colorectal anastomosis by transanal examination.  Rectal tube placed through rectal stump to drain the pelvis more directly.   Diverting mid-transverse loop colostomy done.   Removal of old percutaneous abdominal and transgluteal drains.     Assessment [facility] Stay = 34 days) 8 Days Post-Op    Failure to thrive with delayed anastomotic leak    Plan:  Delayed anastomotic leak   status post percutaneous drainage with some improvement and then decline after  drain fell out and no improvement after replacement drainage.  Diverting loop transverse colostomy (lower abdomen extremely  fixed).  6/25 with very fixed abdomen.    Intraperitoneal drainage going over near prior abdominal abscess down towards left pelvis.  Keep for now.  Keep perianal region keep the area clean and dry with sitz baths and dry powder.  They ordered 3 times daily and as needed.  Severe malnutrition - biggest risk for now  Tolerating tube feeds at goal.  Start cycling nightly tube feeds.  If tolerates that then switch to bolus tube feeds.  Wean off TPN.  Try to encourage oral diet.  Dysphagia one per speech therapy recommendations.  Okay to do solid food from my standpoint   Infection: -Zosyn , Daptomycin , Micafungin  per ID  CT scan done shows pelvis markedly improved and no major bleed abscesses but left upper quadrant with fluid collections and sentiment around the stomach.  I suspect these are postop hematomas given the fact that she has been on heparin  and her hemoglobin.  Discussed in Dr. Jennefer with interventional radiology planning to do drain placement today.  If that is negative for cultures, stop all antibiotics.  Surgical drain serosanguineous with no evidence of the leak or abscess nearby.  Removed 7/1  Followed by infectious disease.  Dr. Luiz agreeing with this tentative plan depending how things go.  -Continue wound VAC and midline incision.  Suspect it will be superficial enough that we can just switch to dry dressing later in the week.  Will see.  Some recurrent atrial fibrillation and rapid rate.    PO/Gtube metoprolol .  Add back amlodipine  since starting to get hypertensive again.  See if medicine agrees.  Anticoagulation.  On heparin  drip.  She is dropping her hemoglobin again.  She has postop hematoma fluid functions.  I am stopping her heparin .  I think she needs to be off it for 48 hours after drain placement and repair.  Sleep medicines agrees.  She already has an IVC filter from the last time she had bleeding problems on anticoagulation prior DVT/PEs.  Challenging  situation.  Stable x 48 hours after all procedures, can consider restarting home DOAC anticoagulation  Consider stopping telemetry as well if medicine agrees since rate has been mostly controlled and cardiology's last input noted a tolerance of mild tachycardia in the 100-110s.  Acute blood loss anemia in the setting of anemia of chronic disease.    Transfused 6/26.  Hemoglobin in the 6s.  Transfuse again.    I am stopping her heparin  for now  IV iron .    Started enteral iron  with vitamin C  6/30.  I would not trust enteral route to be totally adequate but may help  History of urinary retention.  Will keep Foley catheter for now.  May consider removing with intermittent I&O cath or Flexi-Seal if needed.  Get CAT scan and diuresis first.  See if medicine agrees.    -monitor electrolytes & replace as needed.  Keep K>4, Mg>2, Phos>3.  Intermittent potassium supplementation.  Will give enteral potassium x 5 days and follow since I suspect she will need more diuresis.  See if medicine agrees  -Diuresis as tolerated.  Follow-up with anemia needing transfusion.  -Pain control.  Scheduled Tylenol .  Will give oxycodone  nightly.  She prefers to take Dilaudid  but it is very sedating.  Will try and wean interval and encourage nursing to use oxycodone  first in the hopes that she will have a more smooth longer acting  pain control  -mobilize as tolerated to help recovery.  Try and get therapists involved to help her mobilize more.  Biggest challenge.  Patient does not like the idea of going to a skilled facility or rehab as she needed after her month hospital stay last year from emergency surgery.  I do not think that that is avoidable at this time. Work to have strategies to help her get up and mobilize and actually work with physical therapy, or she is going to lose her argument.    I updated the patient's status to the patient and nurse.  Called discussed with the patient's granddaughter who closely  follows her 170/1.  Recommendations were made.  Questions were answered.  They expressed understanding & appreciation.  -Disposition: TBD - I think she needs SNF.  She has been very resistant, but she may reconsider.  Agree with palliative care in the hopes of providing better pain and symptom control.  I am hopeful that if we can get her nutrition better mobilize she will recover but it is a challenge.  She has been through a lot.      I reviewed last 24 h vitals and pain scores, last 48 h intake and output, last 24 h labs and trends, and last 24 h imaging results.  I have reviewed this patient's available data, including medical history, events of note, test results, etc as part of my evaluation.   A significant portion of that time was spent in counseling. Care during the described time interval was provided by me.  This care required moderate level of medical decision making.  06/11/2024    Subjective: (Chief complaint)  CT scan done discussion with patient's granddaughter interventional radiology.  Hemoglobin dropped again.  Getting transfused.  Patient thirsty or something to drink.  No nausea or vomiting.    Objective:  Vital signs:  Vitals:   06/10/24 0500 06/10/24 1215 06/11/24 0453 06/11/24 0704  BP:  134/68 133/70   Pulse:  (!) 101 (!) 101   Resp:  18 16   Temp:  98.7 F (37.1 C) 98.4 F (36.9 C)   TempSrc:   Oral   SpO2:  99% 99%   Weight: 66.6 kg   66.5 kg  Height:        Last BM Date : 06/09/24  Intake/Output   Yesterday:  07/01 0701 - 07/02 0700 In: 3102.6 [I.V.:729.1; WH/HU:8006.4; IV Piggyback:155] Out: 2800 [Urine:1850; Drains:50; Stool:900] This shift:  Total I/O In: 321.3 [I.V.:55.1; NG/GT:266.3] Out: -   Bowel function:  Flatus: YES  BM:  YES   Drain: removed 7/1  Physical Exam:  General: Awake.  Mildly withdrawn but answers questions.  Exhausted.  Depressed.           Eyes: PERRL, normal EOM.  Sclera clear.  No icterus Neuro: CN  II-XII intact w/o focal sensory/motor deficits.  No facial droop. Lymph: No head/neck/groin lymphadenopathy Psych:  No delerium/psychosis/paranoia.  Oriented x 4 HENT: Normocephalic, Mucus membranes moist.  No thrush.  Mild HOH Neck: Supple, No tracheal deviation.  No obvious thyromegaly.  Chest: No pain to chest wall compression.  Good respiratory excursion.  Mild audible wheezing CV:  Pulses intact.  Regular rhythm.  No major extremity edema MS: Normal AROM mjr joints.  No obvious deformity  Abdomen:  Soft.  Mildy distended.  Mildly tender at incisions only.  No guarding.   Right upper quadrant mid transverse diverting loop colostomy pink with edema.  Gas and stool in bag  GU: Foley in place with light yellow clear urine  Rectal: Some erythema but improved overall.  No diarrhea in bed. . Ext:   No deformity.  No mjr edema.  No cyanosis Skin: No petechiae / purpurea.  No major sores.  Warm and dry    Results:   Cultures: Recent Results (from the past 720 hours)  MIC (1 Drug)-Abdominal drain abscess; 05/22/2024; Abdomen; MRSA; Daptomycin      Status: Abnormal   Collection Time: 05/22/24  1:54 PM   Specimen: Abdomen  Result Value Ref Range Status   Min Inhibitory Conc (1 Drug) Final report (A)  Corrected    Comment: (NOTE) Performed At: [facility] [address] [place], [place] [id number] Jennette Shorter MD Ey:[id number] CORRECTED ON 06/24 AT 0836: PREVIOUSLY REPORTED AS Preliminary report    Source ABSCESS  Final    Comment: Performed at [facility], [address], [place], [place] [address]  MIC Result     Status: Abnormal   Collection Time: 05/22/24  1:54 PM  Result Value Ref Range Status   Result 1 (MIC) Comment (A)  Final    Comment: (NOTE) Methicillin - resistant Staphylococcus aureus Identification performed by account, not confirmed by this laboratory. DAPTOMYCIN    <=1 UG/ML = SUSCEPTIBLE Performed At: [facility] [address]  [place], [place] [id number] Jennette Shorter MD Ey:[id number]   Aerobic/Anaerobic Culture w Gram Stain (surgical/deep wound)     Status: None   Collection Time: 05/22/24  3:59 PM   Specimen: Abdomen  Result Value Ref Range Status   Specimen Description   Final    ABDOMEN Performed at [facility], [address] [place], [place] [address]    Special Requests   Final    ABDOMINAL DRAIN Performed at [facility], [address], [place], [place] [address]    Gram Stain   Final    ABUNDANT WBC PRESENT, PREDOMINANTLY PMN RARE GRAM POSITIVE COCCI RARE YEAST WITH PSEUDOHYPHAE    Culture   Final    MODERATE PSEUDOMONAS AERUGINOSA MODERATE STAPHYLOCOCCUS AUREUS SUSCEPTIBILITIES PERFORMED ON PREVIOUS CULTURE WITHIN THE LAST 5 DAYS. MODERATE CANDIDA ALBICANS SEE SEPARATE REPORT NO ANAEROBES ISOLATED Performed at [facility], [address] [place], [place] [address]    Report Status 06/04/2024 FINAL  Final   Organism ID, Bacteria PSEUDOMONAS AERUGINOSA  Final      Susceptibility   Pseudomonas aeruginosa - MIC*    CEFTAZIDIME 4 SENSITIVE Sensitive     CIPROFLOXACIN <=0.25 SENSITIVE Sensitive     GENTAMICIN 4 SENSITIVE Sensitive     IMIPENEM 2 SENSITIVE Sensitive     PIP/TAZO <=4 SENSITIVE Sensitive ug/mL    CEFEPIME 2 SENSITIVE Sensitive     * MODERATE PSEUDOMONAS AERUGINOSA  Aerobic/Anaerobic Culture w Gram Stain (surgical/deep wound)     Status: None   Collection Time: 05/22/24  3:59 PM   Specimen: Back  Result Value Ref Range Status   Specimen Description   Final    BACK Performed at [facility], [address], [place], [place] [address]    Special Requests   Final    TRANSGLUTEAL PERCUTANEOUS IR DRAIN Performed at [facility], [address], [place], [place] [address]    Gram Stain   Final    NO WBC SEEN ABUNDANT GRAM NEGATIVE RODS RARE GRAM POSITIVE COCCI IN PAIRS RARE BUDDING YEAST  SEEN    Culture   Final    ABUNDANT PSEUDOMONAS  AERUGINOSA ABUNDANT METHICILLIN RESISTANT STAPHYLOCOCCUS AUREUS ABUNDANT ENTEROCOCCUS FAECALIS ABUNDANT CANDIDA ALBICANS NO ANAEROBES ISOLATED Sent to [facility] for further susceptibility testing. Performed at [facility], [address], [place], [place] [address]    Report Status 05/27/2024 FINAL  Final   Organism ID, Bacteria PSEUDOMONAS AERUGINOSA  Final   Organism ID, Bacteria METHICILLIN RESISTANT STAPHYLOCOCCUS AUREUS  Final   Organism ID, Bacteria ENTEROCOCCUS FAECALIS  Final      Susceptibility   Enterococcus faecalis - MIC*    AMPICILLIN <=2 SENSITIVE Sensitive     VANCOMYCIN  1 SENSITIVE Sensitive     GENTAMICIN SYNERGY SENSITIVE Sensitive     * ABUNDANT ENTEROCOCCUS FAECALIS   Methicillin resistant staphylococcus aureus - MIC*    CIPROFLOXACIN >=8 RESISTANT Resistant     ERYTHROMYCIN  >=8 RESISTANT Resistant     GENTAMICIN <=0.5 SENSITIVE Sensitive     OXACILLIN >=4 RESISTANT Resistant     TETRACYCLINE <=1 SENSITIVE Sensitive     VANCOMYCIN  1 SENSITIVE Sensitive     TRIMETH/SULFA >=320 RESISTANT Resistant     CLINDAMYCIN <=0.25 SENSITIVE Sensitive     RIFAMPIN <=0.5 SENSITIVE Sensitive     Inducible Clindamycin NEGATIVE Sensitive     LINEZOLID 2 SENSITIVE Sensitive     * ABUNDANT METHICILLIN RESISTANT STAPHYLOCOCCUS AUREUS   Pseudomonas aeruginosa - MIC*    CEFTAZIDIME 4 SENSITIVE Sensitive     CIPROFLOXACIN <=0.25 SENSITIVE Sensitive     GENTAMICIN 4 SENSITIVE Sensitive     IMIPENEM 2 SENSITIVE Sensitive     PIP/TAZO <=4 SENSITIVE Sensitive ug/mL    CEFEPIME 2 SENSITIVE Sensitive     * ABUNDANT PSEUDOMONAS AERUGINOSA  Yeast Susceptibilities     Status: None   Collection Time: 05/22/24  3:59 PM  Result Value Ref Range Status   SOURCE CANDIDA ALBICAN ABD ABSC  Final    Comment: Performed at [facility], [address], [place], [place] [address]   Organism ID, Yeast Candida albicans  Final    Comment:  (NOTE) Identification performed by account, not confirmed by this laboratory.    Amphotericin B MIC 0.25 ug/mL  Final    Comment: (NOTE) Breakpoints have been established for only some organism-drug combinations as indicated. This test was developed and its performance characteristics determined by [facility]. It has not been cleared or approved by the Food and Drug Administration.    Anidulafungin MIC Comment  Final    Comment: (NOTE) 0.03 ug/mL Susceptible Breakpoints have been established for only some organism-drug combinations as indicated. This test was developed and its performance characteristics determined by [facility]. It has not been cleared or approved by the Food and Drug Administration.    Caspofungin MIC Comment  Final    Comment: (NOTE) 0.12 ug/mL Susceptible Breakpoints have been established for only some organism-drug combinations as indicated. This test was developed and its performance characteristics determined by [facility]. It has not been cleared or approved by the Food and Drug Administration.    Fluconazole  Islt MIC 1.0 ug/mL Susceptible  Final    Comment: (NOTE) Breakpoints have been established for only some organism-drug combinations as indicated. This test was developed and its performance characteristics determined by [facility]. It has not been cleared or approved by the Food and Drug Administration.    ISAVUCONAZOLE MIC 0.008 ug/mL or less  Final    Comment: (NOTE) This test was developed and its performance characteristics determined by [facility]. It has not been cleared or approved by the Food and Drug Administration.  Itraconazole MIC 0.06 ug/mL  Final    Comment: (NOTE) Breakpoints have been established for only some organism-drug combinations as indicated. This test was developed and its performance characteristics determined by [facility]. It has not been cleared or approved by the Food and Drug Administration.    Micafungin  MIC Comment   Final    Comment: (NOTE) 0.008 ug/mL or less, Susceptible Breakpoints have been established for only some organism-drug combinations as indicated. This test was developed and its performance characteristics determined by [facility]. It has not been cleared or approved by the Food and Drug Administration.    Posaconazole MIC 0.06 ug/mL  Final    Comment: (NOTE) Breakpoints have been established for only some organism-drug combinations as indicated. This test was developed and its performance characteristics determined by [facility]. It has not been cleared or approved by the Food and Drug Administration.    REZAFUNGIN MIC Comment  Final    Comment: (NOTE) 0.15 ug/mL Susceptible This test was developed and its performance characteristics determined by [facility]. It has not been cleared or approved by the Food and Drug Administration.    Voriconazole MIC Comment  Final    Comment: (NOTE) 0.15 ug/mL Susceptible Breakpoints have been established for only some organism-drug combinations as indicated. This test was developed and its performance characteristics determined by [facility]. It has not been cleared or approved by the Food and Drug Administration. Performed At: [facility] [address] [place], [place] [id number] Jennette Shorter MD Ey:[id number]   Aerobic/Anaerobic Culture w Gram Stain (surgical/deep wound)     Status: None   Collection Time: 05/27/24  5:19 PM   Specimen: Abscess  Result Value Ref Range Status   Specimen Description   Final    ABSCESS ABDOMEN Performed at [facility], [address], [place], [place] [address]    Special Requests   Final    NONE Performed at [facility], [address] [place], [place] [address]    Gram Stain   Final    ABUNDANT WBC PRESENT, PREDOMINANTLY PMN NO ORGANISMS SEEN    Culture   Final    RARE PSEUDOMONAS AERUGINOSA RARE CANDIDA ALBICANS NO ANAEROBES ISOLATED Performed at  [facility], [address] [place], [place] [address]    Report Status 06/01/2024 FINAL  Final   Organism ID, Bacteria PSEUDOMONAS AERUGINOSA  Final      Susceptibility   Pseudomonas aeruginosa - MIC*    CEFTAZIDIME 4 SENSITIVE Sensitive     CIPROFLOXACIN <=0.25 SENSITIVE Sensitive     GENTAMICIN 4 SENSITIVE Sensitive     IMIPENEM 2 SENSITIVE Sensitive     PIP/TAZO <=4 SENSITIVE Sensitive ug/mL    CEFEPIME 2 SENSITIVE Sensitive     * RARE PSEUDOMONAS AERUGINOSA  Surgical pcr screen     Status: Abnormal   Collection Time: 06/02/24  7:55 PM   Specimen: Nasal Mucosa; Nasal Swab  Result Value Ref Range Status   MRSA, PCR POSITIVE (A) NEGATIVE Final   Staphylococcus aureus POSITIVE (A) NEGATIVE Final    Comment: (NOTE) The Xpert SA Assay (FDA approved for NASAL specimens in patients 94 years of age and older), is one component of a comprehensive surveillance program. It is not intended to diagnose infection nor to guide or monitor treatment. Performed at [facility], [address] [place], [place] [address]     Labs: Results for orders placed or performed during the hospital encounter of 05/08/24 (from the past 48 hours)  Glucose, capillary     Status: Abnormal   Collection Time: 06/09/24  8:02 AM  Result Value Ref Range   Glucose-Capillary 121 (H) 70 - 99 mg/dL    Comment: Glucose reference range applies only to samples taken after fasting for at least 8 hours.  Glucose, capillary     Status: Abnormal   Collection Time: 06/09/24 11:44 AM  Result Value Ref Range   Glucose-Capillary 146 (H) 70 - 99 mg/dL    Comment: Glucose reference range applies only to samples taken after fasting for at least 8 hours.  Glucose, capillary     Status: Abnormal   Collection Time: 06/09/24  4:13 PM  Result Value Ref Range   Glucose-Capillary 134 (H) 70 - 99 mg/dL    Comment: Glucose reference range applies only to samples taken after fasting for at least 8 hours.   Glucose, capillary     Status: Abnormal   Collection Time: 06/09/24  7:54 PM  Result Value Ref Range   Glucose-Capillary 162 (H) 70 - 99 mg/dL    Comment: Glucose reference range applies only to samples taken after fasting for at least 8 hours.  Glucose, capillary     Status: Abnormal   Collection Time: 06/10/24 12:00 AM  Result Value Ref Range   Glucose-Capillary 153 (H) 70 - 99 mg/dL    Comment: Glucose reference range applies only to samples taken after fasting for at least 8 hours.  Heparin  level (unfractionated)     Status: None   Collection Time: 06/10/24  4:00 AM  Result Value Ref Range   Heparin  Unfractionated 0.47 0.30 - 0.70 IU/mL    Comment: (NOTE) The clinical reportable range upper limit is being lowered to >1.10 to align with the FDA approved guidance for the current laboratory assay.  If heparin  results are below expected values, and patient dosage has  been confirmed, suggest follow up testing of antithrombin III levels. Performed at [facility], [address] [place], [place] [address]   Basic metabolic panel with GFR     Status: Abnormal   Collection Time: 06/10/24  4:00 AM  Result Value Ref Range   Sodium 138 135 - 145 mmol/L   Potassium 3.6 3.5 - 5.1 mmol/L   Chloride 108 98 - 111 mmol/L   CO2 23 22 - 32 mmol/L   Glucose, Bld 161 (H) 70 - 99 mg/dL    Comment: Glucose reference range applies only to samples taken after fasting for at least 8 hours.   BUN 29 (H) 8 - 23 mg/dL   Creatinine, Ser 9.59 (L) 0.44 - 1.00 mg/dL   Calcium  7.9 (L) 8.9 - 10.3 mg/dL   GFR, Estimated >39 >39 mL/min    Comment: (NOTE) Calculated using the CKD-EPI Creatinine Equation (2021)    Anion gap 7 5 - 15    Comment: Performed at [facility], [address], [place], [place] [address]  Glucose, capillary     Status: Abnormal   Collection Time: 06/10/24  4:10 AM  Result Value Ref Range   Glucose-Capillary 151 (H) 70 - 99 mg/dL     Comment: Glucose reference range applies only to samples taken after fasting for at least 8 hours.  Glucose, capillary     Status: Abnormal   Collection Time: 06/10/24  7:49 AM  Result Value Ref Range   Glucose-Capillary 148 (H) 70 - 99 mg/dL    Comment: Glucose reference range applies only to samples taken after fasting  for at least 8 hours.  Glucose, capillary     Status: Abnormal   Collection Time: 06/10/24 12:04 PM  Result Value Ref Range   Glucose-Capillary 161 (H) 70 - 99 mg/dL    Comment: Glucose reference range applies only to samples taken after fasting for at least 8 hours.  Glucose, capillary     Status: Abnormal   Collection Time: 06/10/24  4:30 PM  Result Value Ref Range   Glucose-Capillary 137 (H) 70 - 99 mg/dL    Comment: Glucose reference range applies only to samples taken after fasting for at least 8 hours.  Glucose, capillary     Status: Abnormal   Collection Time: 06/10/24  8:03 PM  Result Value Ref Range   Glucose-Capillary 117 (H) 70 - 99 mg/dL    Comment: Glucose reference range applies only to samples taken after fasting for at least 8 hours.  Glucose, capillary     Status: Abnormal   Collection Time: 06/11/24 12:06 AM  Result Value Ref Range   Glucose-Capillary 120 (H) 70 - 99 mg/dL    Comment: Glucose reference range applies only to samples taken after fasting for at least 8 hours.  Heparin  level (unfractionated)     Status: None   Collection Time: 06/11/24  3:09 AM  Result Value Ref Range   Heparin  Unfractionated 0.47 0.30 - 0.70 IU/mL    Comment: (NOTE) The clinical reportable range upper limit is being lowered to >1.10 to align with the FDA approved guidance for the current laboratory assay.  If heparin  results are below expected values, and patient dosage has  been confirmed, suggest follow up testing of antithrombin III levels. Performed at [facility], [address] [place], [place] [address]   CBC     Status: Abnormal    Collection Time: 06/11/24  3:09 AM  Result Value Ref Range   WBC 15.7 (H) 4.0 - 10.5 K/uL   RBC 2.22 (L) 3.87 - 5.11 MIL/uL   Hemoglobin 6.2 (LL) 12.0 - 15.0 g/dL    Comment: REPEATED TO VERIFY THIS CRITICAL RESULT HAS VERIFIED AND BEEN CALLED TO GARCIA, K. RN BY ALIEFENDIC,FATIMA ON 07 02 2025 AT 0415, AND HAS BEEN READ BACK.     HCT 21.0 (L) 36.0 - 46.0 %   MCV 94.6 80.0 - 100.0 fL   MCH 27.9 26.0 - 34.0 pg   MCHC 29.5 (L) 30.0 - 36.0 g/dL   RDW 80.7 (H) 88.4 - 84.4 %   Platelets 389 150 - 400 K/uL   nRBC 1.0 (H) 0.0 - 0.2 %    Comment: Performed at [facility], [address], [place], [place] [address]  Protime-INR     Status: Abnormal   Collection Time: 06/11/24  3:09 AM  Result Value Ref Range   Prothrombin Time 15.5 (H) 11.4 - 15.2 seconds   INR 1.2 0.8 - 1.2    Comment: (NOTE) INR goal varies based on device and disease states. Performed at [facility], [address] [place], [place] [address]   Glucose, capillary     Status: None   Collection Time: 06/11/24  4:55 AM  Result Value Ref Range   Glucose-Capillary 99 70 - 99 mg/dL    Comment: Glucose reference range applies only to samples taken after fasting for at least 8 hours.  Glucose, capillary     Status: Abnormal   Collection Time: 06/11/24  7:35 AM  Result Value Ref Range   Glucose-Capillary  106 (H) 70 - 99 mg/dL    Comment: Glucose reference range applies only to samples taken after fasting for at least 8 hours.    Imaging / Studies: CT ABDOMEN PELVIS W CONTRAST Result Date: 06/10/2024 CLINICAL DATA:  Abdominal pain, postop status post diverting transverse colostomy with history of anastomotic leak EXAM: CT ABDOMEN AND PELVIS WITH CONTRAST TECHNIQUE: Multidetector CT imaging of the abdomen and pelvis was performed using the standard protocol following bolus administration of intravenous contrast. RADIATION DOSE REDUCTION: This exam was performed according to the  departmental dose-optimization program which includes automated exposure control, adjustment of the mA and/or kV according to patient size and/or use of iterative reconstruction technique. CONTRAST:  OMNIPAQUE  IOHEXOL  300 MG/ML  SOLN COMPARISON:  06/02/2024 FINDINGS: Lower chest: Moderate bilateral pleural effusions with dependent lower lobe atelectasis. Hepatobiliary: Gallbladder is decompressed, with no evidence of calcified gallstones or gallbladder wall thickening. The liver is unremarkable. No biliary duct dilation. Pancreas: Unremarkable. No pancreatic ductal dilatation or surrounding inflammatory changes. Spleen: Normal in size without focal abnormality. Adrenals/Urinary Tract: Stable punctate less than 2 mm nonobstructing left renal calculus. No right-sided calculi or obstruction. Stable minimally complex right renal cyst does not require specific follow-up. The adrenals are stable. The bladder is decompressed with a Foley catheter. Stomach/Bowel: There is a percutaneous gastrostomy tube identified within the lumen of the gastric antrum. Interval colostomy involving the proximal transverse colon within the right mid abdomen. Postsurgical changes from distal colectomy, with rectal stump identified. Interval removal of the pigtail drainage catheter previously seen within the lower pelvis/rectum. No bowel obstruction or ileus. There is nonspecific wall thickening of the transverse colon given decompressed state. There are multiple complex fluid collections within the abdomen. Along the ventral margin of the stomach there are 2 collections measuring 7.2 x 6.1 cm image 22/2, and 6.8 x 6.0 cm image 33/2, resulting in mass effect upon the stomach. The more inferior collection extends inferiorly to the level of the umbilicus, with fluid-fluid levels and hyperdense material layering dependently in these collections, most prominently at the umbilicus measuring 3.5 x 4.4 cm reference image 50/2. This could  reflect hematocrit level from previous hemorrhage, versus layering material from bowel contents given history of prior anastomosis breakdown. There is no evidence of contrast extravasation to suggest active hemorrhage. Vascular/Lymphatic: Aortic atherosclerosis. IVC filter again noted. No enlarged abdominal or pelvic lymph nodes. Reproductive: Status post hysterectomy. No adnexal masses. Other: In addition to the multiple fluid collections described above, there is a small amount of free fluid throughout the abdomen and pelvis, greatest along the inferior margin of the liver. There is no free intraperitoneal gas. Indwelling surgical drain identified entering via the left mid abdomen, tip in the left lower pelvis. Musculoskeletal: Diffuse subcutaneous edema. No acute or destructive bony abnormalities. Reconstructed images demonstrate no additional findings. IMPRESSION: 1. Multiple complex fluid collections within the upper abdomen as above, containing fluid-fluid levels as above. Differential would include hematocrit levels from prior hemorrhage, versus layering material from bowel contents given prior history of bowel perforation. Continued follow-up recommended. 2. Interval diverting transverse colostomy within the right mid abdomen. No bowel obstruction or ileus. 3. Nonspecific wall thickening of the transverse colon, which may be due to decompressed state. 4. Support devices as above. 5. Moderate bilateral pleural effusions with dependent lower lobe atelectasis. 6. Small volume free fluid, most pronounced in the right upper quadrant. 7. Diffuse body wall edema. Critical Value/emergent results were called by telephone at the time of interpretation  on 06/10/2024 at 10:57 am to provider [facility], who verbally acknowledged these results. Electronically Signed   By: Ozell Daring M.D.   On: 06/10/2024 11:00        Medications / Allergies: per chart  Antibiotics: Anti-infectives (From admission, onward)     Start     Dose/Rate Route Frequency Ordered Stop   06/09/24 1745  DAPTOmycin  (CUBICIN ) IVPB 500 mg/88mL premix        500 mg 100 mL/hr over 30 Minutes Intravenous Daily 06/09/24 1647     06/06/24 1000  micafungin  (MYCAMINE ) 100 mg in sodium chloride  0.9 % 100 mL IVPB        100 mg 105 mL/hr over 1 Hours Intravenous Every 24 hours 06/05/24 1349 06/20/24 0959   06/04/24 1400  piperacillin -tazobactam (ZOSYN ) IVPB 3.375 g        3.375 g 12.5 mL/hr over 240 Minutes Intravenous Every 8 hours 06/04/24 0738 06/09/24 2124   06/04/24 1400  DAPTOmycin  (CUBICIN ) IVPB 500 mg/70mL premix        8 mg/kg  60.9 kg 100 mL/hr over 30 Minutes Intravenous Daily 06/04/24 0738 06/10/24 0126   06/03/24 1000  neomycin  (MYCIFRADIN ) tablet 500 mg  Status:  Discontinued        500 mg Oral 3 times daily 06/03/24 0935 06/03/24 1555   06/03/24 1000  metroNIDAZOLE  (FLAGYL ) tablet 500 mg  Status:  Discontinued       Note to Pharmacy: Take 2 pills (=1000mg ) by mouth at 1pm, 3pm, and 10pm the day before your colorectal operation   500 mg Oral 3 times daily 06/03/24 0935 06/03/24 1555   05/29/24 1500  DAPTOmycin  (CUBICIN ) IVPB 500 mg/50mL premix  Status:  Discontinued        8 mg/kg  60.9 kg 100 mL/hr over 30 Minutes Intravenous Daily 05/29/24 1359 06/04/24 0738   05/28/24 2100  vancomycin  (VANCOREADY) IVPB 750 mg/150 mL  Status:  Discontinued        750 mg 150 mL/hr over 60 Minutes Intravenous Every 12 hours 05/28/24 1419 05/29/24 1359   05/27/24 0800  vancomycin  (VANCOCIN ) IVPB 1000 mg/200 mL premix  Status:  Discontinued        1,000 mg 200 mL/hr over 60 Minutes Intravenous Every 24 hours 05/26/24 1144 05/28/24 1419   05/23/24 1400  piperacillin -tazobactam (ZOSYN ) IVPB 3.375 g  Status:  Discontinued        3.375 g 12.5 mL/hr over 240 Minutes Intravenous Every 8 hours 05/23/24 0804 06/04/24 0738   05/23/24 1200  micafungin  (MYCAMINE ) 100 mg in sodium chloride  0.9 % 100 mL IVPB        100 mg 105 mL/hr over 1  Hours Intravenous Every 24 hours 05/23/24 0755 06/05/24 1221   05/22/24 0800  vancomycin  (VANCOCIN ) IVPB 1000 mg/200 mL premix  Status:  Discontinued        1,000 mg 200 mL/hr over 60 Minutes Intravenous Every 24 hours 05/21/24 0634 05/26/24 0806   05/21/24 0730  vancomycin  (VANCOREADY) IVPB 1250 mg/250 mL        1,250 mg 166.7 mL/hr over 90 Minutes Intravenous  Once 05/21/24 0634 05/21/24 1043   05/19/24 1400  piperacillin -tazobactam (ZOSYN ) IVPB 3.375 g  Status:  Discontinued        3.375 g 12.5 mL/hr over 240 Minutes Intravenous Every 8 hours 05/19/24 1303 05/23/24 0804   05/09/24 0900  erythromycin  250 mg in sodium chloride  0.9 % 100 mL IVPB        250 mg 100  mL/hr over 60 Minutes Intravenous Every 8 hours 05/09/24 0732 05/11/24 0044   05/08/24 2200  cefoTEtan  (CEFOTAN ) 2 g in sodium chloride  0.9 % 100 mL IVPB        2 g 200 mL/hr over 30 Minutes Intravenous Every 12 hours 05/08/24 1759 05/09/24 0749   05/08/24 1400  neomycin  (MYCIFRADIN ) tablet 1,000 mg  Status:  Discontinued       Placed in And Linked Group   1,000 mg Oral 3 times per day 05/08/24 1119 05/08/24 1120   05/08/24 1400  metroNIDAZOLE  (FLAGYL ) tablet 1,000 mg  Status:  Discontinued       Placed in And Linked Group   1,000 mg Oral 3 times per day 05/08/24 1119 05/08/24 1120   05/08/24 1130  cefoTEtan  (CEFOTAN ) 2 g in sodium chloride  0.9 % 100 mL IVPB        2 g 200 mL/hr over 30 Minutes Intravenous On call to O.R. 05/08/24 1119 05/09/24 0749         Note: Portions of this report may have been transcribed using voice recognition software. Every effort was made to ensure accuracy; however, inadvertent computerized transcription errors may be present.   Any transcriptional errors that result from this process are unintentional.    Elspeth KYM Schultze, MD, FACS, MASCRS Esophageal, Gastrointestinal & Colorectal Surgery Robotic and Minimally Invasive Surgery  [facility] [address] [place], [place] [address] [phone] Fax [phone] Main  CONTACT INFORMATION: Weekday (9AM-5PM): Call [facility] main office at [phone] Weeknight (5PM-9AM) or Weekend/Holiday: Check EPIC Web Links tab & use AMION (password  [email or web address]) for General Surgery [facility] coverage  Please, DO NOT use SecureChat  (it is not reliable communication to reach operating surgeons & will lead to a delay in care).   Epic staff messaging available for outptient concerns needing 1-2 business day response.      06/11/2024  7:44 AM

## 2024-06-11 NOTE — Progress Notes (Signed)
   06/11/24 1020  Spiritual Encounters  Type of Visit Attempt (pt unavailable)   Per spiritual care consult request by Ms. Latanza Kingdon, I attempted a visit to offer support and prayer.  Ms. Sambrano was sleeping at this time. I discussed needs with her RN who requested allowing patient to sleep. I encouraged her to page chaplain if patient awake and visit desired.  Zandrea Kenealy L. Fredrica, M.Div [phone]

## 2024-06-11 NOTE — Procedures (Signed)
 Interventional Radiology Procedure:   Indications: Complex post operative intra-abdominal fluid collections  Procedure: CT guided drain placement and aspiration.  Drain removed  Findings: Complex fluid collections with gas in the abdomen.  12 Fr drain placed in an anterior collection but drain did not follow the wire into larger portion of the collection.  Drain was pulled back to anterior abdomen and 50 ml of red cloudy fluid aspirated.  Drain was removed because it was not communicating with larger collections.  Needle was directed into larger pocket but no fluid could be aspirated. US  demonstrated the abdominal collections are severely loculated.    Complications: None     EBL: Minimal  Plan: Fluid sent for culture.  Jentry Mcqueary R. Philip, MD  Pager: [phone]

## 2024-06-11 NOTE — Progress Notes (Addendum)
 Updated by Dr. Philip with interventional radiology.  Small bloody fluid collection aspirated but largest collection appears to be solid hematoma.  To my view is suspicious for rectus muscle hematoma.  No frank stool nor succus.  He did not seen any obvious extravasation nor leak of contrast.  Some pelvic spotting most likely from rectum.    Follow-up on cultures. Tachycardic from procedure.  Will give albumin  x 1. Transfuse another unit of blood stat and follow blood pressure and hemoglobin closely  Continue to hold heparin .    Continue supportive care.  Bleeding/oozing should calm down on its own.  Very high risk for any attempt at surgical reintervention.  I would hold off.  PT/INR within normal limits this morning.  Patient had prior hematology workup fall 2024 that was negative for any coagulopathy by Dr. Lanny when she had bleeding issues on anticoagulation for her acute postop DVT/PE.  May revisit that if she has persistent oozing greater than 48 hours off heparin .

## 2024-06-12 DIAGNOSIS — I4819 Other persistent atrial fibrillation: Secondary | ICD-10-CM | POA: Diagnosis not present

## 2024-06-12 DIAGNOSIS — D62 Acute posthemorrhagic anemia: Secondary | ICD-10-CM | POA: Diagnosis not present

## 2024-06-12 DIAGNOSIS — K5790 Diverticulosis of intestine, part unspecified, without perforation or abscess without bleeding: Secondary | ICD-10-CM | POA: Diagnosis not present

## 2024-06-12 LAB — BASIC METABOLIC PANEL WITH GFR
Anion gap: 7 (ref 5–15)
BUN: 35 mg/dL — ABNORMAL HIGH (ref 8–23)
CO2: 21 mmol/L — ABNORMAL LOW (ref 22–32)
Calcium: 7.8 mg/dL — ABNORMAL LOW (ref 8.9–10.3)
Chloride: 108 mmol/L (ref 98–111)
Creatinine, Ser: 0.74 mg/dL (ref 0.44–1.00)
GFR, Estimated: >60 mL/min (ref >60)
Glucose, Bld: 172 mg/dL — ABNORMAL HIGH (ref 70–99)
Potassium: 3.6 mmol/L (ref 3.5–5.1)
Sodium: 136 mmol/L (ref 135–145)

## 2024-06-12 LAB — CBC
HCT: 21.7 % — ABNORMAL LOW (ref 36.0–46.0)
Hemoglobin: 6.8 g/dL — CL (ref 12.0–15.0)
MCH: 28.3 pg (ref 26.0–34.0)
MCHC: 31.3 g/dL (ref 30.0–36.0)
MCV: 90.4 fL (ref 80.0–100.0)
Platelets: 348 10*3/uL (ref 150–400)
RBC: 2.4 MIL/uL — ABNORMAL LOW (ref 3.87–5.11)
RDW: 18.6 % — ABNORMAL HIGH (ref 11.5–15.5)
WBC: 12.8 10*3/uL — ABNORMAL HIGH (ref 4.0–10.5)
nRBC: 0.9 % — ABNORMAL HIGH (ref 0.0–0.2)

## 2024-06-12 LAB — PREPARE RBC (CROSSMATCH)

## 2024-06-12 LAB — GLUCOSE, CAPILLARY
Glucose-Capillary: 122 mg/dL — ABNORMAL HIGH (ref 70–99)
Glucose-Capillary: 125 mg/dL — ABNORMAL HIGH (ref 70–99)
Glucose-Capillary: 144 mg/dL — ABNORMAL HIGH (ref 70–99)
Glucose-Capillary: 152 mg/dL — ABNORMAL HIGH (ref 70–99)
Glucose-Capillary: 174 mg/dL — ABNORMAL HIGH (ref 70–99)
Glucose-Capillary: 98 mg/dL (ref 70–99)

## 2024-06-12 LAB — HEMOGLOBIN AND HEMATOCRIT, BLOOD
HCT: 31 % — ABNORMAL LOW (ref 36.0–46.0)
Hemoglobin: 10 g/dL — ABNORMAL LOW (ref 12.0–15.0)

## 2024-06-12 LAB — MAGNESIUM: Magnesium: 2 mg/dL (ref 1.7–2.4)

## 2024-06-12 LAB — PHOSPHORUS: Phosphorus: 3.9 mg/dL (ref 2.5–4.6)

## 2024-06-12 MED ORDER — LOPERAMIDE HCL 2 MG PO CAPS
2.0000 mg | ORAL_CAPSULE | Freq: Four times a day (QID) | ORAL | Status: DC | PRN
  Filled 2024-06-12: qty 2

## 2024-06-12 MED ORDER — LACTATED RINGERS IV BOLUS: 1000.0000 mL | Freq: Three times a day (TID) | INTRAVENOUS | Status: AC | PRN

## 2024-06-12 MED ORDER — SODIUM CHLORIDE 0.9% IV SOLUTION
Freq: Once | INTRAVENOUS | Status: DC
Start: 2024-06-12 — End: 2024-06-15

## 2024-06-12 MED ORDER — SODIUM CHLORIDE 0.9% IV SOLUTION: Freq: Once | INTRAVENOUS | Status: AC

## 2024-06-12 MED ORDER — LOPERAMIDE HCL 2 MG PO CAPS
2.0000 mg | ORAL_CAPSULE | Freq: Two times a day (BID) | ORAL | Status: DC
  Administered 2024-06-12: 2 mg via ORAL
  Filled 2024-06-12: qty 1

## 2024-06-12 MED ORDER — LOPERAMIDE HCL 2 MG PO CAPS
4.0000 mg | ORAL_CAPSULE | Freq: Three times a day (TID) | ORAL | Status: DC
  Administered 2024-06-12 – 2024-06-13 (×6): 4 mg via ORAL
  Filled 2024-06-12 (×5): qty 2

## 2024-06-12 MED ORDER — KCL IN DEXTROSE-NACL 40-5-0.45 MEQ/L-%-% IV SOLN
INTRAVENOUS | Status: AC
  Filled 2024-06-12 (×2): qty 1000

## 2024-06-12 NOTE — Progress Notes (Addendum)
 PROGRESS NOTE    Elizabeth Odom  FMW:[id number] DOB: 1941-04-19 DOA: 05/08/2024 PCP: Jesus Bernardino MATSU, MD   Brief Narrative: Elizabeth Odom is a 83 y.o. female with a history of atrial fibrillation, DVT, PE, anemia, PACs, hypertension, mild cognitive impairment, large bowel obstruction. Patient initially presented for surgical management involving history of diverticulitis with planned ostomy takedown with anastomosis. During hospitalization, patient developed altered mental status secondary to polypharmacy in addition to atrial fibrillation with RVR. Cardiology consulted for recommendation and management with subsequent improvement in RVR. On 6/24, patient underwent repeat laparoscopy converted to laparotomy with diverting loops transverse colostomy performed, in addition to drainage of an abdominal abscess and gastrostomy/rectal tube placement. Patient required admission to ICU post-operatively for respiratory failure but was extubated successfully on 6/25 and subsequent transferred out of ICU.    Assessment and Plan:  Persistent atrial fibrillation/flutter Cardiology consulted for management.  Medication adjusted to metoprolol  XL 25 mg daily with recommendation for Eliquis  when okay per surgery. Toprol -XL decreased to 12.5 mg daily. Eliquis  held and transitioned to heparin  IV. Heparin  now held secondary to acute anemia with concern for hematoma. -Continue Toprol -XL 12.5 mg daily  History of diverticulitis with perforation and abscess with colostomy status post ostomy takedown and re-do Ileus Per primary.  Infectious diseases consulted and are planning for management with multi week course of antibiotics.  Urine culture (6/12) significant for Pseudomonas, MRSA, Enterococcus faecalis, Candida albicans.  Drain culture (6/17) significant for rare Pseudomonas aeruginosa.  Ongoing antibiotics per infectious disease.  Patient now on daptomycin , micafungin , Zosyn . Patient underwent diverting  colostomy on 6/24 with gastrotomy tube placement. TPN weaned off. Tube feeds started. -Ongoing general surgery recommendations  Intraabdominal abscess Infectious disease consulted. Culture data is polymicrobial and evident for pseudomonas aeruginosa, staphylococcus aureus and candida albicans. -ID recommendations: Zosyn , daptomycin , micafungin   Acute blood loss anemia Initially secondary to surgery, requiring multiple transfusions. Patient with recurrent significant anemia with concern for intraabdominal abscess vs hematoma. Hemoglobin down to 6.2 on 7/2 requiring blood transfusions secondary to ongoing rectal bleeding. -Follow-up post-transfusion H&H and transfuse as needed -Ongoing general surgery recommendations  AKI Mild. Resolved.  Hypoalbuminemia Noted. Associated poor nutrition.  Leukocytosis In setting of surgery. Improving.  Thrombocytosis Noted. Likely reactive. Mildly elevated and now resolved.  Hyponatremia Mild.  Syncope Syncopal episode occurred during hospitalization while patient was on commode.  Concern for possible vasovagal versus hypotension etiology.  Transthoracic echo obtained with evidence of preserved LVEF of 60 to 65% in addition to no aortic stenosis.  Acute urinary retention Foley inserted on 6/4. Flomax  started. Foley removed on 6/13. Flomax  discontinued.  Acute metabolic encephalopathy Delirium Resolved.  Generalized weakness Physical therapy is ordered already.  Discussed with patient that is important for her to participate as much as possible with therapy to improve her strength.   DVT prophylaxis: Heparin  IV (held). SCDs Code Status:   Code Status: Full Code Family Communication: None at bedside Disposition Plan: Discharge pending continued general surgery and ID recommendations. Anticipate discharge to SNF when medically ready.   Consultants:  General surgery PCCM Infectious disease  Procedures:    Antimicrobials:      Subjective: Patient with recurrent anemia. Rectal bleeding last night and this morning.  Objective: BP 120/82   Pulse 80   Temp 97.6 F (36.4 C)   Resp 18   Ht 5' 5 (1.651 m)   Wt 69.8 kg   SpO2 100%   BMI 25.61 kg/m   Examination:  General exam:  Appears calm and comfortable Respiratory system: Clear to auscultation. Respiratory effort normal. Cardiovascular system: S1 & S2 heard Gastrointestinal system: Abdomen is nondistended, soft and nontender. Normal bowel sounds heard. Central nervous system: Alert and oriented.  Psychiatry: Judgement and insight appear normal.   Data Reviewed: I have personally reviewed following labs and imaging studies  CBC Lab Results  Component Value Date   WBC 12.8 (H) 06/12/2024   RBC 2.40 (L) 06/12/2024   HGB 6.8 (LL) 06/12/2024   HCT 21.7 (L) 06/12/2024   MCV 90.4 06/12/2024   MCH 28.3 06/12/2024   PLT 348 06/12/2024   MCHC 31.3 06/12/2024   RDW 18.6 (H) 06/12/2024   LYMPHSABS 0.7 05/09/2024   MONOABS 1.8 (H) 05/09/2024   EOSABS 0.0 05/09/2024   BASOSABS 0.0 05/09/2024     Last metabolic panel Lab Results  Component Value Date   NA 136 06/12/2024   K 3.6 06/12/2024   CL 108 06/12/2024   CO2 21 (L) 06/12/2024   BUN 35 (H) 06/12/2024   CREATININE 0.74 06/12/2024   GLUCOSE 172 (H) 06/12/2024   GFRNONAA >60 06/12/2024   GFRAA >60 04/10/2016   CALCIUM  7.8 (L) 06/12/2024   PHOS 3.9 06/12/2024   PROT 6.1 (L) 06/09/2024   ALBUMIN  1.5 (L) 06/09/2024   LABGLOB 2.9 04/08/2024   BILITOT 1.4 (H) 06/09/2024   ALKPHOS 196 (H) 06/09/2024   AST 22 06/09/2024   ALT 21 06/09/2024   ANIONGAP 7 06/12/2024    GFR: Estimated Creatinine Clearance: 52.2 mL/min (by C-G formula based on SCr of 0.74 mg/dL).  Recent Results (from the past 240 hours)  Surgical pcr screen     Status: Abnormal   Collection Time: 06/02/24  7:55 PM   Specimen: Nasal Mucosa; Nasal Swab  Result Value Ref Range Status   MRSA, PCR POSITIVE (A) NEGATIVE  Final   Staphylococcus aureus POSITIVE (A) NEGATIVE Final    Comment: (NOTE) The Xpert SA Assay (FDA approved for NASAL specimens in patients 64 years of age and older), is one component of a comprehensive surveillance program. It is not intended to diagnose infection nor to guide or monitor treatment. Performed at [facility], [address], [place], [place] [address]   Aerobic/Anaerobic Culture w Gram Stain (surgical/deep wound)     Status: None (Preliminary result)   Collection Time: 06/11/24  4:12 PM   Specimen: Abdomen; Abdominal Fluid  Result Value Ref Range Status   Specimen Description   Final    ABDOMEN Performed at [facility], [address], [place], [place] [address]    Special Requests   Final    ABDOMINAL FLUID Performed at [facility], [address], [place], [place] [address]    Gram Stain   Final    ABUNDANT WBC PRESENT, PREDOMINANTLY PMN ABUNDANT GRAM NEGATIVE RODS FEW GRAM POSITIVE COCCI    Culture   Final    NO GROWTH < 12 HOURS Performed at [facility], [address] [place], [place] [address]    Report Status PENDING  Incomplete      Radiology Studies: CT GUIDED VISCERAL FLUID DRAIN BY PERC CATH Result Date: 06/12/2024 INDICATION: 83 year old with postoperative fluid collections. Plan for image guided aspiration or drainage. EXAM: CT-guided aspiration and drain placement of abdominal fluid collections MEDICATIONS: Moderate sedation ANESTHESIA/SEDATION: Moderate (conscious) sedation was employed during this procedure. A total of Versed  2mg  and fentanyl  100 mcg was administered intravenously at the order of the provider performing  the procedure. Total intra-service moderate sedation time: 60 minutes. Patient's level of consciousness and vital signs were monitored continuously by radiology nurse throughout the procedure under the supervision of the provider performing the procedure.  COMPLICATIONS: None immediate. PROCEDURE: Informed written consent was obtained from the patient after a thorough discussion of the procedural risks, benefits and alternatives. All questions were addressed. Maximal Sterile Barrier Technique was utilized including caps, mask, sterile gowns, sterile gloves, sterile drape, hand hygiene and skin antiseptic. A timeout was performed prior to the initiation of the procedure. Patient was placed supine on CT scanner. Images through the abdomen were obtained without contrast. Evidence for new fluid collections in the anterior abdomen. Therefore, contrast enhanced CT of the abdomen was performed with 75 mL Omnipaque  300. The anterior abdomen was prepped with Betadine. Sterile field was created. Skin was anesthetized using 1% lidocaine . Using CT guidance, the 18 gauge trocar needle was directed into the anterior abdominal cavity and red cloudy fluid was aspirated. Superstiff Amplatz wire was advanced into the collection and wire position was confirmed with CT guidance. The tract was dilated to accommodate a 12 [place] multipurpose drain. The drain was not aspirating well and follow up CT images demonstrated that the drain went beyond the fluid collection. The drain was pulled back into the fluid collection and approximately 50 mL of cloudy red fluid was aspirated. No additional fluid could be obtained from the drain and the drain was not communicating with the other larger collections. Therefore, this drain was cut and completely removed. An area slightly more caudal to the drain site was prepped with chlorhexidine  and anesthetized with 1% lidocaine . 18 gauge needle was directed into this collection CT-guidance but no fluid could be aspirated. Ultrasound was prepped at this time. Ultrasound demonstrated that the intra-abdominal fluid collections are severely complex with echogenic material probably representing blood products. Based on ultrasound findings, the needle was  removed. Bandages were placed. RADIATION DOSE REDUCTION: This exam was performed according to the departmental dose-optimization program which includes automated exposure control, adjustment of the mA and/or kV according to patient size and/or use of iterative reconstruction technique. FINDINGS: Initial CT images were compared with a CT on 06/10/2024. Compared to the previous CT, there is new complex fluid in the left anterior abdomen containing gas. Again noted is high-density material layering within the fluid collections suggestive for blood products. Contrast enhanced CT was performed in order to better characterize these complex fluid collections. 12 French drain did not track within the larger collections and probably related to the severe loculations. This drain was pulled back into the anterior abdomen and approximately 50 mL of fluid was aspirated from the anterior component. This drain was completely removed. No fluid could be aspirated from the second needle placement. IMPRESSION: 1. Increased air-fluid collections in the left anterior abdomen compared to the exam on 06/10/2024. These fluid collections are very complex with evidence of blood products. 2. CT-guided drain was placed in one of the anterior collections but only 50 mL of fluid could be obtained and the drain was not communicating with the other collections. Therefore, this drain was removed. Fluid was sent for culture. Electronically Signed   By: Juliene Balder M.D.   On: 06/12/2024 08:57       LOS: 35 days    Elgin Lam, MD [facility] 06/12/2024, 11:59 AM   If 7PM-7AM, please contact night-coverage [email or web address]

## 2024-06-12 NOTE — Consult Note (Addendum)
 WOC Nurse ostomy follow up Stoma type/location: RLQ colostomy Stomal assessment/size: 32 mm round. Red and viable. Partial dead tissue on the suture. Peristomal assessment: intact. Treatment options for stomal/peristomal skin: ring [id number]. Output aprox. 40 ml on the bag at 1100. Soft brown stools. Pt is having diarrhea episode for the bottom. Ostomy pouching: 1pc. #[id number].  Education provided: No family present. The pt does no follow the instructions about ostomy care.  She must to be independent if she intents to go home. I explained for her the importance of being independent, and the WOC team will try one more time a teaching section.   Enrolled patient in [facility] Discharge program: Yes (06/26)  WOC Nurse wound follow up Wound type: Surgical. Midline abd. Measurement: 16 x 1 x 0.5 cm. Wound bed: 100% red. Drainage (amount, consistency, odor) Scant amount. No odor, serosanguinous. 80 ml on the canister at 1100. Talked with Dr Sheldon about the wound management with a wet to dry dressing instead. He agrees.  Dressing procedure/placement/frequency:  Apply a wet gauze, cover with dry gauze and tape. Change twice a day or PRN.  WOC team will follow MON.   Please reconsult if further assistance is needed. Thank-you,  Lela Holm BSN, RN, [employer], WOC  (Pager: [phone])

## 2024-06-12 NOTE — Plan of Care (Signed)
  Problem: Safety: Goal: Ability to remain free from injury will improve Outcome: Progressing   Problem: Respiratory: Goal: Ability to maintain a clear airway and adequate ventilation will improve Outcome: Progressing   Problem: Skin Integrity: Goal: Will show signs of wound healing Outcome: Progressing

## 2024-06-12 NOTE — Progress Notes (Signed)
 PT Cancellation Note  Patient Details Name: Elizabeth Odom MRN: [id number] DOB: 1941-04-05   Cancelled Treatment:     HgB 6.8 will attempt to see another day.  Pt has been evaluated with rec for SNF.   Katheryn Leap  PTA Acute  Rehabilitation Services Office M-F          [phone]

## 2024-06-12 NOTE — Progress Notes (Signed)
 [facility]    Date of Admission:  05/08/2024      ID: Elizabeth Odom is a 83 y.o. female with   Principal Problem:   Diverticular disease Active Problems:   A-fib (HCC)   Restless leg   ABLA (acute blood loss anemia)   Need for emotional support   Acute urinary retention   Mixed hyperlipidemia   Essential hypertension   Diverticular disease of left colon   History of DVT (deep vein thrombosis)   History of pulmonary embolus (PE)   Pre-diabetes   Presence of IVC filter   Stricture of sigmoid colon (HCC)   Protein-calorie malnutrition, severe   Hypokalemia   S/P percutaneous endoscopic gastrostomy (PEG) tube placement (HCC)   Goals of care, counseling/discussion    Subjective: Having frequent watery diarrhea, acidity which is causing contact dermatitis to buttock, inner thighs, perineum.  Micro - GNR and GPC noted from CT aspirated fluid-anterior abdominal fluid collection.  IR unable to reach larger posterior fluid collection that severely loculate  Medications:   sodium chloride    Intravenous Once   acetaminophen   1,000 mg Per Tube Q6H   amLODipine   5 mg Oral QHS   vitamin C   500 mg Per Tube Daily   Chlorhexidine  Gluconate Cloth  6 each Topical Daily   ferrous sulfate  325 mg Oral BID WC   loperamide   4 mg Oral TID WC & HS   metoprolol  tartrate  12.5 mg Per Tube BID   nystatin    Topical BID   mouth rinse  15 mL Mouth Rinse 4 times per day   oxyCODONE   5 mg Oral QHS,MR X 1   pantoprazole   40 mg Oral Q1200   polycarbophil  625 mg Per Tube BID   potassium chloride   40 mEq Oral Daily   sodium chloride  flush  10-40 mL Intracatheter Q12H   sodium chloride  flush  3 mL Intravenous Q12H    Objective: Vital signs in last 24 hours: Temp:  [97.5 F (36.4 C)-99.5 F (37.5 C)] 97.6 F (36.4 C) (07/03 1330) Pulse Rate:  [61-128] 89 (07/03 1330) Resp:  [16-21] 18 (07/03 1045) BP: (101-159)/(54-95) 141/95 (07/03 1330) SpO2:  [99 %-100 %]  100 % (07/03 1026) Weight:  [69.8 kg] 69.8 kg (07/03 0351)  Physical Exam  Constitutional:  oriented to person, place, and time. appears chronically ill and undernourished. No distress.  HENT: Carbon/AT, PERRLA, no scleral icterus Mouth/Throat: Oropharynx is clear and moist. No oropharyngeal exudate.  Cardiovascular: Normal rate, regular rhythm and normal heart sounds. Exam reveals no gallop and no friction rub.  No murmur heard.  Pulmonary/Chest: Effort normal and breath sounds normal. No respiratory distress.  has no wheezes.  Neck = supple, no nuchal rigidity Abdominal: Soft. Wound vac, ostomy, and gastromy. Lymphadenopathy: no cervical adenopathy. No axillary adenopathy Neurological: alert and oriented to person, place, and time.  Skin: back and buttock with dermatitis. (Buttock-stool exposure)  Psychiatric: tearful   Lab Results Recent Labs    06/10/24 0400 06/11/24 0309 06/11/24 0309 06/11/24 1756 06/12/24 0433  WBC  --  15.7*  --   --  12.8*  HGB  --  6.2*   < > 6.0* 6.8*  HCT  --  21.0*   < > 19.7* 21.7*  NA 138  --   --   --  136  K 3.6  --   --   --  3.6  CL 108  --   --   --  108  CO2 23  --   --   --  21*  BUN 29*  --   --   --  35*  CREATININE 0.40*  --   --   --  0.74   < > = values in this interval not displayed.   Liver Panel No results for input(s): PROT, ALBUMIN , AST, ALT, ALKPHOS, BILITOT, BILIDIR, IBILI in the last 72 hours. Sedimentation Rate No results for input(s): ESRSEDRATE in the last 72 hours. C-Reactive Protein No results for input(s): CRP in the last 72 hours.  Microbiology: 7/2 GNR and GPC but no yeast Studies/Results: CT GUIDED VISCERAL FLUID DRAIN BY PERC CATH Result Date: 06/12/2024 INDICATION: 83 year old with postoperative fluid collections. Plan for image guided aspiration or drainage. EXAM: CT-guided aspiration and drain placement of abdominal fluid collections MEDICATIONS: Moderate sedation ANESTHESIA/SEDATION:  Moderate (conscious) sedation was employed during this procedure. A total of Versed  2mg  and fentanyl  100 mcg was administered intravenously at the order of the provider performing the procedure. Total intra-service moderate sedation time: 60 minutes. Patient's level of consciousness and vital signs were monitored continuously by radiology nurse throughout the procedure under the supervision of the provider performing the procedure. COMPLICATIONS: None immediate. PROCEDURE: Informed written consent was obtained from the patient after a thorough discussion of the procedural risks, benefits and alternatives. All questions were addressed. Maximal Sterile Barrier Technique was utilized including caps, mask, sterile gowns, sterile gloves, sterile drape, hand hygiene and skin antiseptic. A timeout was performed prior to the initiation of the procedure. Patient was placed supine on CT scanner. Images through the abdomen were obtained without contrast. Evidence for new fluid collections in the anterior abdomen. Therefore, contrast enhanced CT of the abdomen was performed with 75 mL Omnipaque  300. The anterior abdomen was prepped with Betadine. Sterile field was created. Skin was anesthetized using 1% lidocaine . Using CT guidance, the 18 gauge trocar needle was directed into the anterior abdominal cavity and red cloudy fluid was aspirated. Superstiff Amplatz wire was advanced into the collection and wire position was confirmed with CT guidance. The tract was dilated to accommodate a 12 [place] multipurpose drain. The drain was not aspirating well and follow up CT images demonstrated that the drain went beyond the fluid collection. The drain was pulled back into the fluid collection and approximately 50 mL of cloudy red fluid was aspirated. No additional fluid could be obtained from the drain and the drain was not communicating with the other larger collections. Therefore, this drain was cut and completely removed. An area  slightly more caudal to the drain site was prepped with chlorhexidine  and anesthetized with 1% lidocaine . 18 gauge needle was directed into this collection CT-guidance but no fluid could be aspirated. Ultrasound was prepped at this time. Ultrasound demonstrated that the intra-abdominal fluid collections are severely complex with echogenic material probably representing blood products. Based on ultrasound findings, the needle was removed. Bandages were placed. RADIATION DOSE REDUCTION: This exam was performed according to the departmental dose-optimization program which includes automated exposure control, adjustment of the mA and/or kV according to patient size and/or use of iterative reconstruction technique. FINDINGS: Initial CT images were compared with a CT on 06/10/2024. Compared to the previous CT, there is new complex fluid in the left anterior abdomen containing gas. Again noted is high-density material layering within the fluid collections suggestive for blood products. Contrast enhanced CT was performed in order to better characterize these complex fluid collections. 12 French drain did not track within the  larger collections and probably related to the severe loculations. This drain was pulled back into the anterior abdomen and approximately 50 mL of fluid was aspirated from the anterior component. This drain was completely removed. No fluid could be aspirated from the second needle placement. IMPRESSION: 1. Increased air-fluid collections in the left anterior abdomen compared to the exam on 06/10/2024. These fluid collections are very complex with evidence of blood products. 2. CT-guided drain was placed in one of the anterior collections but only 50 mL of fluid could be obtained and the drain was not communicating with the other collections. Therefore, this drain was removed. Fluid was sent for culture. Electronically Signed   By: Juliene Balder M.D.   On: 06/12/2024 08:57      Assessment/Plan: Intra-abdominal infection = continue with piptazo and daptomycin  especially since unable to completely drain larger fluid collections.   Diarrhea - agree with plan as outlined by dr gross. To increase immodium and hold tube feeds for the time being to see if any changes are noted.   Dermatitis = flexi-seal may help if it can be placed. Would check with dr gross if safe todo given recent surgery  [facility] Pager: [phone]  06/12/2024, 5:19 PM

## 2024-06-12 NOTE — Progress Notes (Signed)
 Patient with persistent diarrhea despite holding tube feeds of uncertain etiology.  Raises concerns of some type of spontaneous enteral vaginal/anorectal fistula.  Not consistent with the studies for the past 2 days by CAT scan.  Nonetheless, will increase antidiarrheals to maximum for now.  Continue holding tube feeds.  Keep gastrostomy tube to gravity.  She can sip on some liquids as she desires.  That is all she really wants right now anyway.  Okay to put Flexi-Seal in rectal stump to catch and divert the diarrhea for now since she is already diverted.  If diarrhea calms down, can reconsider starting tube feeds.  If that fails then TPN.  If not better by next week may need to revisit palliative options since surgical or close to being exhausted at this point.  Discussion with patient's floor nurse Raynard.   I called and had discussion with the patient's granddaughter who has her closest family member & caregiver.  Questions answered.  She expressed understanding and appreciation.

## 2024-06-12 NOTE — Progress Notes (Addendum)
 06/12/2024  Callaway M Birkhead [id number] [birth year]  CARE TEAM: PCP: Jesus Bernardino MATSU, MD  Outpatient Care Team: Patient Care Team: Jesus Bernardino MATSU, MD as PCP - General (Internal Medicine) [name], MD as Consulting Physician (Gastroenterology) Rubin Calamity, MD as Consulting Physician (General Surgery) [name], MD (Neurology) [name], MD as Consulting Physician (Urology) [name], MD as Consulting Physician (Cardiology) [name], MD as Consulting Physician (Colon and Rectal Surgery)  Inpatient Treatment Team: Treatment Team:  [name], MD Nikki Rams, Aliene, MD Dennise Kingsley, MD [name], RN Rosan Deward ORN, NP Sonjia Held, MD [name], MD Jennefer Ester PARAS, MD Allred, Harman POUR, PA-C Bobbette Clause, MD Primus Gee, NT Andrez Clayborne CROME, RN [name], A, OT   Problem List:   Principal Problem:   Diverticular disease Active Problems:   A-fib [facility])   Restless leg   ABLA (acute blood loss anemia)   Need for emotional support   Acute urinary retention   Mixed hyperlipidemia   Essential hypertension   Diverticular disease of left colon   History of DVT (deep vein thrombosis)   History of pulmonary embolus (PE)   Pre-diabetes   Presence of IVC filter   Stricture of sigmoid colon (HCC)   Protein-calorie malnutrition, severe   Hypokalemia   S/P percutaneous endoscopic gastrostomy (PEG) tube placement (HCC)   Goals of care, counseling/discussion   05/08/2024  POST-OPERATIVE DIAGNOSIS:   COLOSTOMY FOR RESECTION, DESIRE FOR OSTOMY TAKEDOWN RECTAL STRICTURE HISTORY OF DIVERTICULITIS WITH PERFORATION & ABSCESS   PROCEDURE:   -ROBOTIC RECTOSIGMOID RESECTION (LAR) -TAKEDOWN OF END COLOSTOMY WITH ANASTOMOSIS -RESECTION OF SMALL INTESTINE WITH ANASTOMOSIS -SMALL BOWEL REPAIR -LYSIS OF ADHESIONS x 115 MINUTES (66% OF CASE),  -INTRAOPERATIVE ASSESSMENT OF TISSUE VASCULAR PERFUSION USING ICG (indocyanine green )  IMMUNOFLUORESCENCE,  -TRANSVERSUS ABDOMINIS PLANE (TAP) BLOCK - BILATERAL -FLEXIBLE SIGMOIDOSCOPY   SURGEON:  [name], MD   05/20/2024  Post procedural Dx: Post op abscess, multiple   Technically successful CT guided placed of a  10 Fr drainage catheter placement x 2  into the midline ventral and dorsal perirectal abscesses.     [name] LABOR, MD    05/28/2024  IMPRESSION: Satisfactory removal of the midline anterior abscess drainage catheter and placement of a new midline anterior pelvic drainage catheter as well as a posterior transgluteal drainage catheter in pelvic abscesses.     Electronically Signed   By: [name]    06/03/2024  POST-OPERATIVE DIAGNOSIS:   DELAYED COLORECTAL ANASTOMOTIC LEAK, NEED FOR FECAL DIVERSION CHRONIC PELVIC ABSCESS   PROCEDURE:   DIAGNOSTIC LAPAROSCOPY CONVERTED TO LAPAROTOMY DIVERTING LOOP TRANSVERSE COLOSTOMY DRAINAGE OF ABDOMINAL ABSCESS GASTROSTOMY TUBE PLACEMENT RECTAL TUBE PLACEMENT   SURGEON:  [name], MD   OR FINDINGS: Very dense concrete intraperitoneal and pelvic adhesions infraumbilically.  Bladder, small bowel, omentum walling off the lower pelvis with delayed anastomotic leak.   Near disruption of colorectal anastomosis by transanal examination.  Rectal tube placed through rectal stump to drain the pelvis more directly.   Diverting mid-transverse loop colostomy done.   Removal of old percutaneous abdominal and transgluteal drains.     Assessment [facility] Stay = 35 days) 9 Days Post-Op    Failure to thrive with delayed anastomotic leak     Plan:  Delayed anastomotic leak   status post percutaneous drainage with some improvement and then decline after drain fell out and no improvement after replacement drainage.  Diverting loop transverse colostomy (lower  abdomen extremely fixed).  6/25 with very fixed abdomen.  She had been tolerating tube feeds at goal but now having diarrhea out  anus.  Do not know if she is having diarrhea across the diverting loop vs she has any new fistula down to the pelvis/rectum.  Hold TFs and regroup  Keep perianal region keep the area clean and dry with sitz baths TID and dry powder BID.  Midline incision with wound VAC.  Her abdominal wall is not particularly thick so I suspect we will be able to transition to just packing within this coming week.  Wound ostomy nurses following.  Severe malnutrition - biggest risk for now  Diarrhea of uncertain etiology.  Do not know if passing across diverting colostomy or new entero/colo-pelvic fistula.  There are bowel loops that fall down the pelvis but they did not look particularly inflamed or abnormal last night.  Hold tube feeds & regroup  Suspect needs well enteral contrasted CT to evaluate   May need TPN & bowel rest if not better   Infection: -Zosyn , Daptomycin , Micafungin  per ID  Surgical drain serosanguineous with no evidence of the leak or abscess nearby.  Removed 7/1  Bloody fluid collection aspirated 7/2 by Dr. Marty with interventional radiology.  He did not have a drain.  Larger collection not able to have drain placed.  Wonder if it is clotted hematoma which explain her drop in hemoglobin.  Initial culture showing gram-negative rods.  Follow.  Followed by infectious disease.  Dr. Luiz agreeing with this tentative plan depending how things go.  -Continue wound VAC and midline incision.  Suspect it will be superficial enough that we can just switch to dry dressing later in the week.  Will see.  Some recurrent atrial fibrillation and rapid rate.    PO/Gtube metoprolol .  Add back amlodipine  since starting to get hypertensive again.  See if medicine agrees.     Acute blood loss anemia in the setting of anemia of chronic disease.    Reviewed left upper quadrant collections or postoperative hematomas on full anticoagulation.  Stopping all heparin .  Looks almost like a rectus  sheath hematoma to my view.  Attempting to drain turned things up as well as that she has been bleeding since procedure follow closely.  Should self tamponade.  Hostile abdomen = hold off on any surgical intervention  Transfused 6/26, 7/2.  Hemoglobin in the 6s.  Transfuse again.    She is having persistent anemia and bleeding.  Stopped all heparin  7/2.  I think she is again failed anticoagulation which was a problem in the past.  (Needed an IVC filter when she developed DVT/PEs in the fall 2024.) I worry about some coagulopathy but INR is within normal limits, platelet count within normal limits.  Of note, she had a prior workup and follow-up that did not show any coagulopathy by Dr. Lanny with hematology.  May reconsult if persistent oozing/bleeding  IV iron .    Started enteral iron  with vitamin C  6/30.  I would not trust enteral route to be totally adequate but may help  History of urinary retention.  Will keep Foley catheter for now.  May consider removing with intermittent I&O cath or Flexi-Seal if needed.  Get CAT scan and diuresis first.  See if medicine agrees.    -monitor electrolytes & replace as needed.  Keep K>4, Mg>2, Phos>3.  Intermittent potassium supplementation.  Will give enteral potassium x 5 days and follow since I suspect she will need  more diuresis.  See if medicine agrees  -Diuresis as tolerated.  Follow-up with anemia needing transfusion.  -Pain control.  Scheduled Tylenol .  Will give oxycodone  nightly.  She prefers to take Dilaudid  but it is very sedating.  Will try and wean interval and encourage nursing to use oxycodone  first in the hopes that she will have a more smooth longer acting pain control  -mobilize as tolerated to help recovery.  Try and get therapists involved to help her mobilize more.  Biggest challenge.  Patient does not like the idea of going to a skilled facility or rehab as she needed after her month hospital stay last year from emergency surgery.  I do  not think that that is avoidable at this time. Work to have strategies to help her get up and mobilize and actually work with physical therapy, or she is going to lose her argument.    I updated the patient's status to the patient and nurse.  Called discussed with the patient's granddaughter who closely follows her 170/1.  Recommendations were made.  Questions were answered.  They expressed understanding & appreciation.  -Disposition: TBD.  There is no way she can go straight home.  She will need skilled nursing facility and rehab in the past.  Patient still challenged with recurring issues.  Agree with palliative care.  I do not think we have definitely had a point of futility but do not want her to have prolonged suffering either.  See if we can turn things around in this coming week.     I reviewed last 24 h vitals and pain scores, last 48 h intake and output, last 24 h labs and trends, and last 24 h imaging results.  I have reviewed this patient's available data, including medical history, events of note, test results, etc as part of my evaluation.   A significant portion of that time was spent in counseling. Care during the described time interval was provided by me.  This care required moderate level of medical decision making.  06/12/2024    Subjective: (Chief complaint)  Drain placement done but switched to aspiration only.  Patient with some bleeding out of the pelvis rectum.  Started to have diarrhea transanally with her tube feeds.  Not nauseated but not hungry.    Objective:  Vital signs:  Vitals:   06/11/24 2359 06/12/24 0351 06/12/24 0553 06/12/24 0630  BP: 111/72 112/63 130/85 (!) 159/85  Pulse: (!) 102 61 (!) 105 (!) 112  Resp: 18 20 (!) 21 (!) 21  Temp: 98.2 F (36.8 C) 98 F (36.7 C) 97.8 F (36.6 C) (!) 97.5 F (36.4 C)  TempSrc: Oral Oral Oral Oral  SpO2: 99% 100% 100%   Weight:  69.8 kg    Height:        Last BM Date : 06/11/24  Intake/Output    Yesterday:  07/02 0701 - 07/03 0700 In: 3185.4 [I.V.:336.9; Blood:682.5; NG/GT:1845; IV Piggyback:320.9] Out: 800 [Urine:800] This shift:  Total I/O In: 1206.7 [Blood:315; NG/GT:798.8; IV Piggyback:93] Out: 350 [Urine:350]  Bowel function:  Flatus: YES  BM:  YES   Drain: removed 7/1  Physical Exam:  General: Awake.  Mildly withdrawn but answers questions.  Exhausted.  Depressed.           Eyes: PERRL, normal EOM.  Sclera clear.  No icterus Neuro: CN II-XII intact w/o focal sensory/motor deficits.  No facial droop. Lymph: No head/neck/groin lymphadenopathy Psych:  No delerium/psychosis/paranoia.  Oriented x 4 HENT: Normocephalic,  Mucus membranes moist.  No thrush.  Mild HOH Neck: Supple, No tracheal deviation.  No obvious thyromegaly.  Chest: No pain to chest wall compression.  Good respiratory excursion.  Mild audible wheezing CV:  Pulses intact.  Regular rhythm.  No major extremity edema MS: Normal AROM mjr joints.  No obvious deformity  Abdomen:  Soft.  Moderately distended.  Mildly tender at incisions only.  No guarding.   Right upper quadrant mid transverse diverting loop colostomy pink with edema.  Gas and stool in bag    GU: Foley in place with light yellow clear urine  Rectal: Liquid tan diarrhea in bed Ext:   No deformity.  No mjr edema.  No cyanosis Skin: No petechiae / purpurea.  No major sores.  Warm and dry    Results:   Cultures: Recent Results (from the past 720 hours)  MIC (1 Drug)-Abdominal drain abscess; 05/22/2024; Abdomen; MRSA; Daptomycin      Status: Abnormal   Collection Time: 05/22/24  1:54 PM   Specimen: Abdomen  Result Value Ref Range Status   Min Inhibitory Conc (1 Drug) Final report (A)  Corrected    Comment: (NOTE) Performed At: [facility] [address] [place], [place] [id number] Jennette Shorter MD Ey:[id number] CORRECTED ON 06/24 AT 0836: PREVIOUSLY REPORTED AS Preliminary report    Source ABSCESS  Final    Comment:  Performed at [facility], [address] [place], [place] [address]  MIC Result     Status: Abnormal   Collection Time: 05/22/24  1:54 PM  Result Value Ref Range Status   Result 1 (MIC) Comment (A)  Final    Comment: (NOTE) Methicillin - resistant Staphylococcus aureus Identification performed by account, not confirmed by this laboratory. DAPTOMYCIN    <=1 UG/ML = SUSCEPTIBLE Performed At: [facility] [address] [place], [place] [id number] Jennette Shorter MD Ey:[id number]   Aerobic/Anaerobic Culture w Gram Stain (surgical/deep wound)     Status: None   Collection Time: 05/22/24  3:59 PM   Specimen: Abdomen  Result Value Ref Range Status   Specimen Description   Final    ABDOMEN Performed at [facility], [address] [place], [place] [address]    Special Requests   Final    ABDOMINAL DRAIN Performed at [facility], [address], [place], [place] [address]    Gram Stain   Final    ABUNDANT WBC PRESENT, PREDOMINANTLY PMN RARE GRAM POSITIVE COCCI RARE YEAST WITH PSEUDOHYPHAE    Culture   Final    MODERATE PSEUDOMONAS AERUGINOSA MODERATE STAPHYLOCOCCUS AUREUS SUSCEPTIBILITIES PERFORMED ON PREVIOUS CULTURE WITHIN THE LAST 5 DAYS. MODERATE CANDIDA ALBICANS SEE SEPARATE REPORT NO ANAEROBES ISOLATED Performed at [facility], [address], [place], [place] [address]    Report Status 06/04/2024 FINAL  Final   Organism ID, Bacteria PSEUDOMONAS AERUGINOSA  Final      Susceptibility   Pseudomonas aeruginosa - MIC*    CEFTAZIDIME 4 SENSITIVE Sensitive     CIPROFLOXACIN <=0.25 SENSITIVE Sensitive     GENTAMICIN 4 SENSITIVE Sensitive     IMIPENEM 2 SENSITIVE Sensitive     PIP/TAZO <=4 SENSITIVE Sensitive ug/mL    CEFEPIME 2 SENSITIVE Sensitive     * MODERATE PSEUDOMONAS AERUGINOSA  Aerobic/Anaerobic Culture w Gram Stain (surgical/deep wound)     Status: None   Collection Time: 05/22/24  3:59 PM   Specimen:  Back  Result Value Ref Range Status   Specimen Description  Final    BACK Performed at [facility], [address] [place], [place] [address]    Special Requests   Final    TRANSGLUTEAL PERCUTANEOUS IR DRAIN Performed at [facility], [address], [place], [place] [address]    Gram Stain   Final    NO WBC SEEN ABUNDANT GRAM NEGATIVE RODS RARE GRAM POSITIVE COCCI IN PAIRS RARE BUDDING YEAST SEEN    Culture   Final    ABUNDANT PSEUDOMONAS AERUGINOSA ABUNDANT METHICILLIN RESISTANT STAPHYLOCOCCUS AUREUS ABUNDANT ENTEROCOCCUS FAECALIS ABUNDANT CANDIDA ALBICANS NO ANAEROBES ISOLATED Sent to [facility] for further susceptibility testing. Performed at [facility], [address], [place], [place] [address]    Report Status 05/27/2024 FINAL  Final   Organism ID, Bacteria PSEUDOMONAS AERUGINOSA  Final   Organism ID, Bacteria METHICILLIN RESISTANT STAPHYLOCOCCUS AUREUS  Final   Organism ID, Bacteria ENTEROCOCCUS FAECALIS  Final      Susceptibility   Enterococcus faecalis - MIC*    AMPICILLIN <=2 SENSITIVE Sensitive     VANCOMYCIN  1 SENSITIVE Sensitive     GENTAMICIN SYNERGY SENSITIVE Sensitive     * ABUNDANT ENTEROCOCCUS FAECALIS   Methicillin resistant staphylococcus aureus - MIC*    CIPROFLOXACIN >=8 RESISTANT Resistant     ERYTHROMYCIN  >=8 RESISTANT Resistant     GENTAMICIN <=0.5 SENSITIVE Sensitive     OXACILLIN >=4 RESISTANT Resistant     TETRACYCLINE <=1 SENSITIVE Sensitive     VANCOMYCIN  1 SENSITIVE Sensitive     TRIMETH/SULFA >=320 RESISTANT Resistant     CLINDAMYCIN <=0.25 SENSITIVE Sensitive     RIFAMPIN <=0.5 SENSITIVE Sensitive     Inducible Clindamycin NEGATIVE Sensitive     LINEZOLID 2 SENSITIVE Sensitive     * ABUNDANT METHICILLIN RESISTANT STAPHYLOCOCCUS AUREUS   Pseudomonas aeruginosa - MIC*    CEFTAZIDIME 4 SENSITIVE Sensitive     CIPROFLOXACIN <=0.25 SENSITIVE Sensitive     GENTAMICIN 4 SENSITIVE Sensitive      IMIPENEM 2 SENSITIVE Sensitive     PIP/TAZO <=4 SENSITIVE Sensitive ug/mL    CEFEPIME 2 SENSITIVE Sensitive     * ABUNDANT PSEUDOMONAS AERUGINOSA  Yeast Susceptibilities     Status: None   Collection Time: 05/22/24  3:59 PM  Result Value Ref Range Status   SOURCE CANDIDA ALBICAN ABD ABSC  Final    Comment: Performed at [facility], [address], [place], [place] [address]   Organism ID, Yeast Candida albicans  Final    Comment: (NOTE) Identification performed by account, not confirmed by this laboratory.    Amphotericin B MIC 0.25 ug/mL  Final    Comment: (NOTE) Breakpoints have been established for only some organism-drug combinations as indicated. This test was developed and its performance characteristics determined by [facility]. It has not been cleared or approved by the Food and Drug Administration.    Anidulafungin MIC Comment  Final    Comment: (NOTE) 0.03 ug/mL Susceptible Breakpoints have been established for only some organism-drug combinations as indicated. This test was developed and its performance characteristics determined by [facility]. It has not been cleared or approved by the Food and Drug Administration.    Caspofungin MIC Comment  Final    Comment: (NOTE) 0.12 ug/mL Susceptible Breakpoints have been established for only some organism-drug combinations as indicated. This test was developed and its performance characteristics determined by [facility]. It has not been cleared or approved by the Food and Drug Administration.    Fluconazole  Islt MIC 1.0  ug/mL Susceptible  Final    Comment: (NOTE) Breakpoints have been established for only some organism-drug combinations as indicated. This test was developed and its performance characteristics determined by [facility]. It has not been cleared or approved by the Food and Drug Administration.    ISAVUCONAZOLE MIC 0.008 ug/mL or less  Final    Comment: (NOTE) This test was developed and its performance  characteristics determined by [facility]. It has not been cleared or approved by the Food and Drug Administration.    Itraconazole MIC 0.06 ug/mL  Final    Comment: (NOTE) Breakpoints have been established for only some organism-drug combinations as indicated. This test was developed and its performance characteristics determined by [facility]. It has not been cleared or approved by the Food and Drug Administration.    Micafungin  MIC Comment  Final    Comment: (NOTE) 0.008 ug/mL or less, Susceptible Breakpoints have been established for only some organism-drug combinations as indicated. This test was developed and its performance characteristics determined by [facility]. It has not been cleared or approved by the Food and Drug Administration.    Posaconazole MIC 0.06 ug/mL  Final    Comment: (NOTE) Breakpoints have been established for only some organism-drug combinations as indicated. This test was developed and its performance characteristics determined by [facility]. It has not been cleared or approved by the Food and Drug Administration.    REZAFUNGIN MIC Comment  Final    Comment: (NOTE) 0.15 ug/mL Susceptible This test was developed and its performance characteristics determined by [facility]. It has not been cleared or approved by the Food and Drug Administration.    Voriconazole MIC Comment  Final    Comment: (NOTE) 0.15 ug/mL Susceptible Breakpoints have been established for only some organism-drug combinations as indicated. This test was developed and its performance characteristics determined by [facility]. It has not been cleared or approved by the Food and Drug Administration. Performed At: [facility] [address] [place], [place] [id number] Jennette Shorter MD Ey:[id number]   Aerobic/Anaerobic Culture w Gram Stain (surgical/deep wound)     Status: None   Collection Time: 05/27/24  5:19 PM   Specimen: Abscess  Result Value Ref Range Status   Specimen  Description   Final    ABSCESS ABDOMEN Performed at [facility], [address] [place], [place] [address]    Special Requests   Final    NONE Performed at [facility], [address], [place], [place] [address]    Gram Stain   Final    ABUNDANT WBC PRESENT, PREDOMINANTLY PMN NO ORGANISMS SEEN    Culture   Final    RARE PSEUDOMONAS AERUGINOSA RARE CANDIDA ALBICANS NO ANAEROBES ISOLATED Performed at [facility], [address], [place], [place] [address]    Report Status 06/01/2024 FINAL  Final   Organism ID, Bacteria PSEUDOMONAS AERUGINOSA  Final      Susceptibility   Pseudomonas aeruginosa - MIC*    CEFTAZIDIME 4 SENSITIVE Sensitive     CIPROFLOXACIN <=0.25 SENSITIVE Sensitive     GENTAMICIN 4 SENSITIVE Sensitive     IMIPENEM 2 SENSITIVE Sensitive     PIP/TAZO <=4 SENSITIVE Sensitive ug/mL    CEFEPIME 2 SENSITIVE Sensitive     * RARE PSEUDOMONAS AERUGINOSA  Surgical pcr screen     Status: Abnormal   Collection Time: 06/02/24  7:55 PM   Specimen: Nasal Mucosa; Nasal Swab  Result Value Ref Range Status   MRSA, PCR POSITIVE (A) NEGATIVE  Final   Staphylococcus aureus POSITIVE (A) NEGATIVE Final    Comment: (NOTE) The Xpert SA Assay (FDA approved for NASAL specimens in patients 61 years of age and older), is one component of a comprehensive surveillance program. It is not intended to diagnose infection nor to guide or monitor treatment. Performed at [facility], [address] [place], [place] [address]   Aerobic/Anaerobic Culture w Gram Stain (surgical/deep wound)     Status: None (Preliminary result)   Collection Time: 06/11/24  4:12 PM   Specimen: Abdomen; Abdominal Fluid  Result Value Ref Range Status   Specimen Description   Final    ABDOMEN Performed at [facility], [address] [place], [place] [address]    Special Requests   Final    ABDOMINAL FLUID Performed at [facility], [address], [place], [place] [address]    Gram Stain   Final    ABUNDANT WBC PRESENT, PREDOMINANTLY PMN ABUNDANT GRAM NEGATIVE RODS FEW GRAM POSITIVE COCCI Performed at [facility], [address], [place], [place] [address]    Culture PENDING  Incomplete   Report Status PENDING  Incomplete    Labs: Results for orders placed or performed during the hospital encounter of 05/08/24 (from the past 48 hours)  Glucose, capillary     Status: Abnormal   Collection Time: 06/10/24  7:49 AM  Result Value Ref Range   Glucose-Capillary 148 (H) 70 - 99 mg/dL    Comment: Glucose reference range applies only to samples taken after fasting for at least 8 hours.  Glucose, capillary     Status: Abnormal   Collection Time: 06/10/24 12:04 PM  Result Value Ref Range   Glucose-Capillary 161 (H) 70 - 99 mg/dL    Comment: Glucose reference range applies only to samples taken after fasting for at least 8 hours.  Glucose, capillary     Status: Abnormal   Collection Time: 06/10/24  4:30 PM  Result Value Ref Range   Glucose-Capillary 137 (H) 70 - 99 mg/dL    Comment: Glucose reference range applies only to samples taken after fasting for at least 8 hours.  Glucose, capillary     Status: Abnormal   Collection Time: 06/10/24  8:03 PM  Result Value Ref Range   Glucose-Capillary 117 (H) 70 - 99 mg/dL    Comment: Glucose reference range applies only to samples taken after fasting for at least 8 hours.  Glucose, capillary     Status: Abnormal   Collection Time: 06/11/24 12:06 AM  Result Value Ref Range   Glucose-Capillary 120 (H) 70 - 99 mg/dL    Comment: Glucose reference range applies only to samples taken after fasting for at least 8 hours.  Heparin  level (unfractionated)     Status: None   Collection Time: 06/11/24  3:09 AM  Result Value Ref Range   Heparin  Unfractionated 0.47 0.30 - 0.70 IU/mL    Comment: (NOTE) The clinical reportable range upper limit is being  lowered to >1.10 to align with the FDA approved guidance for the current laboratory assay.  If heparin  results are below expected values, and patient dosage has  been confirmed, suggest follow up testing of antithrombin III levels. Performed at [facility], [address] [place], [place] [address]   CBC     Status: Abnormal   Collection Time: 06/11/24  3:09 AM  Result Value Ref Range   WBC 15.7 (H) 4.0 - 10.5  K/uL   RBC 2.22 (L) 3.87 - 5.11 MIL/uL   Hemoglobin 6.2 (LL) 12.0 - 15.0 g/dL    Comment: REPEATED TO VERIFY THIS CRITICAL RESULT HAS VERIFIED AND BEEN CALLED TO GARCIA, K. RN BY ALIEFENDIC,FATIMA ON 07 02 2025 AT 0415, AND HAS BEEN READ BACK.     HCT 21.0 (L) 36.0 - 46.0 %   MCV 94.6 80.0 - 100.0 fL   MCH 27.9 26.0 - 34.0 pg   MCHC 29.5 (L) 30.0 - 36.0 g/dL   RDW 80.7 (H) 88.4 - 84.4 %   Platelets 389 150 - 400 K/uL   nRBC 1.0 (H) 0.0 - 0.2 %    Comment: Performed at [facility], [address], [place], [place] [address]  Protime-INR     Status: Abnormal   Collection Time: 06/11/24  3:09 AM  Result Value Ref Range   Prothrombin Time 15.5 (H) 11.4 - 15.2 seconds   INR 1.2 0.8 - 1.2    Comment: (NOTE) INR goal varies based on device and disease states. Performed at [facility], [address], [place], [place] [address]   Glucose, capillary     Status: None   Collection Time: 06/11/24  4:55 AM  Result Value Ref Range   Glucose-Capillary 99 70 - 99 mg/dL    Comment: Glucose reference range applies only to samples taken after fasting for at least 8 hours.  Glucose, capillary     Status: Abnormal   Collection Time: 06/11/24  7:35 AM  Result Value Ref Range   Glucose-Capillary 106 (H) 70 - 99 mg/dL    Comment: Glucose reference range applies only to samples taken after fasting for at least 8 hours.  Prepare RBC (crossmatch)     Status: None   Collection Time: 06/11/24  8:00 AM  Result Value Ref Range    Order Confirmation      ORDER PROCESSED BY BLOOD BANK Performed at [facility], [address], [place], [place] [address]   Type and screen [facility]     Status: None (Preliminary result)   Collection Time: 06/11/24  8:03 AM  Result Value Ref Range   ABO/RH(D) A POS    Antibody Screen NEG    Sample Expiration 06/14/2024,2359    Unit Number [id number]    Blood Component Type RED CELLS,LR    Unit division 00    Status of Unit ISSUED    Transfusion Status OK TO TRANSFUSE    Crossmatch Result Compatible    Unit Number [id number]    Blood Component Type RED CELLS,LR    Unit division 00    Status of Unit ISSUED    Transfusion Status OK TO TRANSFUSE    Crossmatch Result Compatible    Unit Number [id number]    Blood Component Type RED CELLS,LR    Unit division 00    Status of Unit ISSUED    Transfusion Status OK TO TRANSFUSE    Crossmatch Result      Compatible Performed at [facility], [address] [place], [place] [address]   Glucose, capillary     Status: None   Collection Time: 06/11/24 11:30 AM  Result Value Ref Range   Glucose-Capillary 92 70 - 99 mg/dL    Comment: Glucose reference range applies only to samples taken after fasting for at least 8 hours.  Aerobic/Anaerobic Culture w Gram Stain (surgical/deep wound)     Status: None (Preliminary result)  Collection Time: 06/11/24  4:12 PM   Specimen: Abdomen; Abdominal Fluid  Result Value Ref Range   Specimen Description      ABDOMEN Performed at [facility], [address], [place], [place] [address]    Special Requests      ABDOMINAL FLUID Performed at [facility], [address], [place], [place] [address]    Gram Stain      ABUNDANT WBC PRESENT, PREDOMINANTLY PMN ABUNDANT GRAM NEGATIVE RODS FEW GRAM POSITIVE COCCI Performed at [facility], [address] [place], [place] [address]    Culture  PENDING    Report Status PENDING   Prepare RBC (crossmatch)     Status: None   Collection Time: 06/11/24  4:25 PM  Result Value Ref Range   Order Confirmation      ORDER PROCESSED BY BLOOD BANK Performed at [facility], [address], [place], [place] [address]   Glucose, capillary     Status: None   Collection Time: 06/11/24  4:32 PM  Result Value Ref Range   Glucose-Capillary 87 70 - 99 mg/dL    Comment: Glucose reference range applies only to samples taken after fasting for at least 8 hours.  Hemoglobin and hematocrit, blood     Status: Abnormal   Collection Time: 06/11/24  5:56 PM  Result Value Ref Range   Hemoglobin 6.0 (LL) 12.0 - 15.0 g/dL    Comment: CRITICAL VALUE NOTED.  VALUE IS CONSISTENT WITH PREVIOUSLY REPORTED AND CALLED VALUE. REPEATED TO VERIFY    HCT 19.7 (L) 36.0 - 46.0 %    Comment: Performed at [facility], [address], [place], [place] [address]  Glucose, capillary     Status: Abnormal   Collection Time: 06/11/24  8:38 PM  Result Value Ref Range   Glucose-Capillary 140 (H) 70 - 99 mg/dL    Comment: Glucose reference range applies only to samples taken after fasting for at least 8 hours.  Glucose, capillary     Status: Abnormal   Collection Time: 06/11/24 11:55 PM  Result Value Ref Range   Glucose-Capillary 152 (H) 70 - 99 mg/dL    Comment: Glucose reference range applies only to samples taken after fasting for at least 8 hours.  Glucose, capillary     Status: Abnormal   Collection Time: 06/12/24  3:50 AM  Result Value Ref Range   Glucose-Capillary 144 (H) 70 - 99 mg/dL    Comment: Glucose reference range applies only to samples taken after fasting for at least 8 hours.  Basic metabolic panel with GFR     Status: Abnormal   Collection Time: 06/12/24  4:33 AM  Result Value Ref Range   Sodium 136 135 - 145 mmol/L   Potassium 3.6 3.5 - 5.1 mmol/L   Chloride 108 98 - 111 mmol/L   CO2 21 (L) 22 - 32 mmol/L    Glucose, Bld 172 (H) 70 - 99 mg/dL    Comment: Glucose reference range applies only to samples taken after fasting for at least 8 hours.   BUN 35 (H) 8 - 23 mg/dL   Creatinine, Ser 9.25 0.44 - 1.00 mg/dL   Calcium  7.8 (L) 8.9 - 10.3 mg/dL   GFR, Estimated >39 >39 mL/min    Comment: (NOTE) Calculated using the CKD-EPI Creatinine Equation (2021)    Anion gap 7 5 - 15    Comment: Performed at [facility], [address]  [place], [place] [address]  Magnesium      Status: None   Collection Time: 06/12/24  4:33 AM  Result Value Ref Range   Magnesium  2.0 1.7 - 2.4 mg/dL    Comment: Performed at [facility], [address] [place], [place] [address]  Phosphorus     Status: None   Collection Time: 06/12/24  4:33 AM  Result Value Ref Range   Phosphorus 3.9 2.5 - 4.6 mg/dL    Comment: Performed at [facility], [address] [place], [place] [address]  CBC     Status: Abnormal   Collection Time: 06/12/24  4:33 AM  Result Value Ref Range   WBC 12.8 (H) 4.0 - 10.5 K/uL   RBC 2.40 (L) 3.87 - 5.11 MIL/uL   Hemoglobin 6.8 (LL) 12.0 - 15.0 g/dL    Comment: REPEATED TO VERIFY THIS CRITICAL RESULT HAS VERIFIED AND BEEN CALLED TO A. CHAVEZ, RN BY KATIE DAVIS ON 07 03 2025 AT 0502, AND HAS BEEN READ BACK.     HCT 21.7 (L) 36.0 - 46.0 %   MCV 90.4 80.0 - 100.0 fL   MCH 28.3 26.0 - 34.0 pg   MCHC 31.3 30.0 - 36.0 g/dL   RDW 81.3 (H) 88.4 - 84.4 %   Platelets 348 150 - 400 K/uL   nRBC 0.9 (H) 0.0 - 0.2 %    Comment: Performed at [facility], [address] [place], [place] [address]  Prepare RBC (crossmatch)     Status: None   Collection Time: 06/12/24  5:28 AM  Result Value Ref Range   Order Confirmation      ORDER PROCESSED BY BLOOD BANK Performed at [facility], [address], [place], [place] [address]     Imaging / Studies: CT ABDOMEN PELVIS W CONTRAST Result Date: 06/10/2024 CLINICAL  DATA:  Abdominal pain, postop status post diverting transverse colostomy with history of anastomotic leak EXAM: CT ABDOMEN AND PELVIS WITH CONTRAST TECHNIQUE: Multidetector CT imaging of the abdomen and pelvis was performed using the standard protocol following bolus administration of intravenous contrast. RADIATION DOSE REDUCTION: This exam was performed according to the departmental dose-optimization program which includes automated exposure control, adjustment of the mA and/or kV according to patient size and/or use of iterative reconstruction technique. CONTRAST:  OMNIPAQUE  IOHEXOL  300 MG/ML  SOLN COMPARISON:  06/02/2024 FINDINGS: Lower chest: Moderate bilateral pleural effusions with dependent lower lobe atelectasis. Hepatobiliary: Gallbladder is decompressed, with no evidence of calcified gallstones or gallbladder wall thickening. The liver is unremarkable. No biliary duct dilation. Pancreas: Unremarkable. No pancreatic ductal dilatation or surrounding inflammatory changes. Spleen: Normal in size without focal abnormality. Adrenals/Urinary Tract: Stable punctate less than 2 mm nonobstructing left renal calculus. No right-sided calculi or obstruction. Stable minimally complex right renal cyst does not require specific follow-up. The adrenals are stable. The bladder is decompressed with a Foley catheter. Stomach/Bowel: There is a percutaneous gastrostomy tube identified within the lumen of the gastric antrum. Interval colostomy involving the proximal transverse colon within the right mid abdomen. Postsurgical changes from distal colectomy, with rectal stump identified. Interval removal of the pigtail drainage catheter previously seen within the lower pelvis/rectum. No bowel obstruction or ileus. There is nonspecific wall thickening of the transverse colon given decompressed state. There are multiple complex fluid collections within the abdomen. Along the ventral margin of the stomach there are 2  collections measuring 7.2 x 6.1 cm image 22/2, and 6.8 x 6.0 cm  image 33/2, resulting in mass effect upon the stomach. The more inferior collection extends inferiorly to the level of the umbilicus, with fluid-fluid levels and hyperdense material layering dependently in these collections, most prominently at the umbilicus measuring 3.5 x 4.4 cm reference image 50/2. This could reflect hematocrit level from previous hemorrhage, versus layering material from bowel contents given history of prior anastomosis breakdown. There is no evidence of contrast extravasation to suggest active hemorrhage. Vascular/Lymphatic: Aortic atherosclerosis. IVC filter again noted. No enlarged abdominal or pelvic lymph nodes. Reproductive: Status post hysterectomy. No adnexal masses. Other: In addition to the multiple fluid collections described above, there is a small amount of free fluid throughout the abdomen and pelvis, greatest along the inferior margin of the liver. There is no free intraperitoneal gas. Indwelling surgical drain identified entering via the left mid abdomen, tip in the left lower pelvis. Musculoskeletal: Diffuse subcutaneous edema. No acute or destructive bony abnormalities. Reconstructed images demonstrate no additional findings. IMPRESSION: 1. Multiple complex fluid collections within the upper abdomen as above, containing fluid-fluid levels as above. Differential would include hematocrit levels from prior hemorrhage, versus layering material from bowel contents given prior history of bowel perforation. Continued follow-up recommended. 2. Interval diverting transverse colostomy within the right mid abdomen. No bowel obstruction or ileus. 3. Nonspecific wall thickening of the transverse colon, which may be due to decompressed state. 4. Support devices as above. 5. Moderate bilateral pleural effusions with dependent lower lobe atelectasis. 6. Small volume free fluid, most pronounced in the right upper quadrant. 7.  Diffuse body wall edema. Critical Value/emergent results were called by telephone at the time of interpretation on 06/10/2024 at 10:57 am to provider [facility], who verbally acknowledged these results. Electronically Signed   By: Ozell Daring M.D.   On: 06/10/2024 11:00        Medications / Allergies: per chart  Antibiotics: Anti-infectives (From admission, onward)    Start     Dose/Rate Route Frequency Ordered Stop   06/11/24 1545  piperacillin -tazobactam (ZOSYN ) IVPB 3.375 g        3.375 g 12.5 mL/hr over 240 Minutes Intravenous Every 8 hours 06/11/24 1457     06/09/24 1745  DAPTOmycin  (CUBICIN ) IVPB 500 mg/50mL premix        500 mg 100 mL/hr over 30 Minutes Intravenous Daily 06/09/24 1647     06/06/24 1000  micafungin  (MYCAMINE ) 100 mg in sodium chloride  0.9 % 100 mL IVPB        100 mg 105 mL/hr over 1 Hours Intravenous Every 24 hours 06/05/24 1349 06/20/24 0959   06/04/24 1400  piperacillin -tazobactam (ZOSYN ) IVPB 3.375 g        3.375 g 12.5 mL/hr over 240 Minutes Intravenous Every 8 hours 06/04/24 0738 06/09/24 2124   06/04/24 1400  DAPTOmycin  (CUBICIN ) IVPB 500 mg/68mL premix        8 mg/kg  60.9 kg 100 mL/hr over 30 Minutes Intravenous Daily 06/04/24 0738 06/10/24 0126   06/03/24 1000  neomycin  (MYCIFRADIN ) tablet 500 mg  Status:  Discontinued        500 mg Oral 3 times daily 06/03/24 0935 06/03/24 1555   06/03/24 1000  metroNIDAZOLE  (FLAGYL ) tablet 500 mg  Status:  Discontinued       Note to Pharmacy: Take 2 pills (=1000mg ) by mouth at 1pm, 3pm, and 10pm the day before your colorectal operation   500 mg Oral 3 times daily 06/03/24 0935 06/03/24 1555   05/29/24 1500  DAPTOmycin  (CUBICIN ) IVPB 500  mg/26mL premix  Status:  Discontinued        8 mg/kg  60.9 kg 100 mL/hr over 30 Minutes Intravenous Daily 05/29/24 1359 06/04/24 0738   05/28/24 2100  vancomycin  (VANCOREADY) IVPB 750 mg/150 mL  Status:  Discontinued        750 mg 150 mL/hr over 60 Minutes Intravenous  Every 12 hours 05/28/24 1419 05/29/24 1359   05/27/24 0800  vancomycin  (VANCOCIN ) IVPB 1000 mg/200 mL premix  Status:  Discontinued        1,000 mg 200 mL/hr over 60 Minutes Intravenous Every 24 hours 05/26/24 1144 05/28/24 1419   05/23/24 1400  piperacillin -tazobactam (ZOSYN ) IVPB 3.375 g  Status:  Discontinued        3.375 g 12.5 mL/hr over 240 Minutes Intravenous Every 8 hours 05/23/24 0804 06/04/24 0738   05/23/24 1200  micafungin  (MYCAMINE ) 100 mg in sodium chloride  0.9 % 100 mL IVPB        100 mg 105 mL/hr over 1 Hours Intravenous Every 24 hours 05/23/24 0755 06/05/24 1221   05/22/24 0800  vancomycin  (VANCOCIN ) IVPB 1000 mg/200 mL premix  Status:  Discontinued        1,000 mg 200 mL/hr over 60 Minutes Intravenous Every 24 hours 05/21/24 0634 05/26/24 0806   05/21/24 0730  vancomycin  (VANCOREADY) IVPB 1250 mg/250 mL        1,250 mg 166.7 mL/hr over 90 Minutes Intravenous  Once 05/21/24 0634 05/21/24 1043   05/19/24 1400  piperacillin -tazobactam (ZOSYN ) IVPB 3.375 g  Status:  Discontinued        3.375 g 12.5 mL/hr over 240 Minutes Intravenous Every 8 hours 05/19/24 1303 05/23/24 0804   05/09/24 0900  erythromycin  250 mg in sodium chloride  0.9 % 100 mL IVPB        250 mg 100 mL/hr over 60 Minutes Intravenous Every 8 hours 05/09/24 0732 05/11/24 0044   05/08/24 2200  cefoTEtan  (CEFOTAN ) 2 g in sodium chloride  0.9 % 100 mL IVPB        2 g 200 mL/hr over 30 Minutes Intravenous Every 12 hours 05/08/24 1759 05/09/24 0749   05/08/24 1400  neomycin  (MYCIFRADIN ) tablet 1,000 mg  Status:  Discontinued       Placed in And Linked Group   1,000 mg Oral 3 times per day 05/08/24 1119 05/08/24 1120   05/08/24 1400  metroNIDAZOLE  (FLAGYL ) tablet 1,000 mg  Status:  Discontinued       Placed in And Linked Group   1,000 mg Oral 3 times per day 05/08/24 1119 05/08/24 1120   05/08/24 1130  cefoTEtan  (CEFOTAN ) 2 g in sodium chloride  0.9 % 100 mL IVPB        2 g 200 mL/hr over 30 Minutes Intravenous  On call to O.R. 05/08/24 1119 05/09/24 0749         Note: Portions of this report may have been transcribed using voice recognition software. Every effort was made to ensure accuracy; however, inadvertent computerized transcription errors may be present.   Any transcriptional errors that result from this process are unintentional.    Elspeth KYM Schultze, MD, FACS, MASCRS Esophageal, Gastrointestinal & Colorectal Surgery Robotic and Minimally Invasive Surgery  [facility] [address] [place], [place] [address] [phone] Fax [phone] Main  CONTACT INFORMATION: Weekday (9AM-5PM): Call [facility] main office at [phone] Weeknight (5PM-9AM) or Weekend/Holiday: Check EPIC Web Links tab & use AMION (password  [facility]) for General Surgery  CCS coverage  Please, DO NOT use SecureChat  (it is not reliable communication to reach operating surgeons & will lead to a delay in care).   Epic staff messaging available for outptient concerns needing 1-2 business day response.      06/12/2024  6:48 AM

## 2024-06-13 DIAGNOSIS — T8110XA Postprocedural shock unspecified, initial encounter: Secondary | ICD-10-CM

## 2024-06-13 DIAGNOSIS — Z7189 Other specified counseling: Secondary | ICD-10-CM | POA: Diagnosis not present

## 2024-06-13 DIAGNOSIS — T8119XS Other postprocedural shock, sequela: Secondary | ICD-10-CM | POA: Diagnosis not present

## 2024-06-13 DIAGNOSIS — I4819 Other persistent atrial fibrillation: Secondary | ICD-10-CM | POA: Diagnosis not present

## 2024-06-13 DIAGNOSIS — Z515 Encounter for palliative care: Secondary | ICD-10-CM | POA: Diagnosis not present

## 2024-06-13 DIAGNOSIS — E43 Unspecified severe protein-calorie malnutrition: Secondary | ICD-10-CM | POA: Diagnosis not present

## 2024-06-13 DIAGNOSIS — D62 Acute posthemorrhagic anemia: Secondary | ICD-10-CM | POA: Diagnosis not present

## 2024-06-13 DIAGNOSIS — K5790 Diverticulosis of intestine, part unspecified, without perforation or abscess without bleeding: Secondary | ICD-10-CM | POA: Diagnosis not present

## 2024-06-13 LAB — GLUCOSE, CAPILLARY
Glucose-Capillary: 102 mg/dL — ABNORMAL HIGH (ref 70–99)
Glucose-Capillary: 100 mg/dL — ABNORMAL HIGH (ref 70–99)
Glucose-Capillary: 108 mg/dL — ABNORMAL HIGH (ref 70–99)
Glucose-Capillary: 113 mg/dL — ABNORMAL HIGH (ref 70–99)
Glucose-Capillary: 100 mg/dL — ABNORMAL HIGH (ref 70–99)
Glucose-Capillary: 89 mg/dL (ref 70–99)

## 2024-06-13 LAB — CBC
HCT: 30.1 % — ABNORMAL LOW (ref 36.0–46.0)
Hemoglobin: 9.6 g/dL — ABNORMAL LOW (ref 12.0–15.0)
MCH: 28.3 pg (ref 26.0–34.0)
MCHC: 31.9 g/dL (ref 30.0–36.0)
MCV: 88.8 fL (ref 80.0–100.0)
Platelets: 335 K/uL (ref 150–400)
RBC: 3.39 MIL/uL — ABNORMAL LOW (ref 3.87–5.11)
RDW: 18.4 % — ABNORMAL HIGH (ref 11.5–15.5)
WBC: 9.5 K/uL (ref 4.0–10.5)
nRBC: 0.4 % — ABNORMAL HIGH (ref 0.0–0.2)

## 2024-06-13 MED ORDER — TRAVASOL 10 % IV SOLN
INTRAVENOUS | Status: AC
  Filled 2024-06-13: qty 537.6

## 2024-06-13 MED ORDER — GERHARDT'S BUTT CREAM
TOPICAL_CREAM | Freq: Two times a day (BID) | CUTANEOUS | Status: DC
  Filled 2024-06-13: qty 60

## 2024-06-13 MED ORDER — OXYCODONE HCL 5 MG PO TABS
10.0000 mg | ORAL_TABLET | Freq: Every day | ORAL | Status: DC
  Administered 2024-06-13: 10 mg via ORAL
  Filled 2024-06-13: qty 2

## 2024-06-13 MED ORDER — POTASSIUM CHLORIDE IN NACL 40-0.9 MEQ/L-% IV SOLN
INTRAVENOUS | Status: AC
  Filled 2024-06-13: qty 1000

## 2024-06-13 MED ORDER — FERROUS SULFATE 75 (15 FE) MG/ML PO SOLN
15.0000 mg | Freq: Two times a day (BID) | ORAL | Status: DC
  Administered 2024-06-13 – 2024-06-16 (×7): 15 mg
  Filled 2024-06-13 (×9): qty 1

## 2024-06-13 NOTE — Progress Notes (Signed)
 PHARMACY - TOTAL PARENTERAL NUTRITION CONSULT NOTE   Indication: Prolonged ileus  Patient Measurements: Height: 5' 5 (165.1 cm) Weight: 69.8 kg (153 lb 14.1 oz) IBW/kg (Calculated) : 57 TPN AdjBW (KG): 61.3 Body mass index is 25.61 kg/m.  Assessment: 77 yoF with history of diverticulitis with perforation and abscess. 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 She requested ostomy takedown and underwent LOA, colostomy takedown, small bowel resection with anastomosis on 5/29. Pharmacy is consulted to dose TPN starting 6/2 for postop ileus.  Post-op course complicated by anastomotic leak, multiple-intraabdominal abscesses. Post-op bleeding. Patient is now s/p diverting loop transverse colostomy.  Glucose / Insulin : no Hx DM, A1c 5.1%. BG goal <180 -CBGs q4h remain < 150 -Not on SSI currently  Electrolytes:  Bicarb 21 slightly low. Other WNL  Hepatic: albumin  remains low, Alk Phos elevated -TG 174 (6/28) > 205 (6/30) I/O:  - MIVF: dextrose  5 % and 0.45 % NaCl with KCl 40 mEq/L at 27ml/hr -UOP (foley) :  -Stool: 220 with multiple episodes of water  diarrhea. Place Flexiseal  -Drain:   GI Imaging:  -6/9 CT: Dempsey dehiscence along the rectal anastomosis, free air and multiple fluid collections -6/16 CT: Similar appearance at the rectal anastomosis concerning for dehiscence -6/23: poss catheter occlusion of PERC drain, several fistulous of sigmoid colon, gas collection, rectal anastomosis with dehiscence cavity communicating to small bowel, Multiple additional small loculated fluid collection at new percutaneous drainage catheter site. Additional small loculated fluid collection small bowel mesentery.  - 7/1: CT: Multiple complex fluid collections within the upper abdomen   GI Surgeries / Procedures: -5/29: LAR, SBR, colostomy takedown, LOA -6/10 drains  placed x 2 in IR (midline abd, transgluteal) -6/17: Replacement of drainage catheter by IR -6/24: diverting loop transverse colostomy, I&D of abdominal abscess, gastrotomy tube placement, rectal tube placement.   Central access: Double lumen PICC placed 6/2 TPN start date: 6/2>7/1, resume 7/4>>  Nutritional Goals: Goal TPN rate is 70 mL/hr (provides 94 g of protein and 1680 kcals per day)  RD Assessment:  Estimated Needs Total Energy Estimated Needs: 1600-1750 kcals Total Protein Estimated Needs: 80-95 grams Total Fluid Estimated Needs: >/= 1.6L  Current Nutrition:  TPN 7/4>> Clear liquids as tolerated  Multiple calorie counts this admission. Not meeting caloric needs. Refusing all supplements. Pt underwent G-tube placement 6/24. Tube feeds started 6/26, stopped for abdominal distention and discomfort.  6/30 Tube feeds started>>stopped 7/3   Plan:  Resume TPN at 60ml/hr and assess electrolyte and glucose toleration. Can likely titrate back up to previous goal 86ml/hr tomorrow if stable. Clear liquids as tolerated Electrolytes in TPN: No change Na 75 mEq/L (max) K 40 mEq/L Ca 5 mEq/L Mg 5 mEq/L Phos 15 mmol/L Cl:Ac 1:1 Resume sSSI q6h if CBGs trend up >150 TF on HOLD Change IVF to NS + K+67meq qt 35ml/hr when TPN hung. Likely will need to remove K+ tomorrow with TPN rate increase. Monitor TPN labs on Mon/Thurs, and as needed.    Casimir Camelia Ours, PharmD, BCPS Clinical Pharmacist 06/13/2024 10:18 AM

## 2024-06-13 NOTE — Progress Notes (Signed)
   06/13/24 1010  Spiritual Encounters  Type of Visit Initial  Care provided to: Patient  Referral source Patient request   Per spiritual consult request by patient through care team, I visited briefly with Ms. Elizabeth Odom.  [name] initially appeared to be asleep but she responded when I let her know of my presence. She welcomed my visit but did not wish to engage at length, she merely said she appreciated this chaplain's presence.  I let [name] know that I had been by earlier in the week and would remain available, offering prayers as she had requested. I let [name] know that she could ask for a chaplain through her RN at anytime. Will continue to follow.  Dennis Killilea L. Fredrica, M.Div [phone]

## 2024-06-13 NOTE — Progress Notes (Addendum)
 [facility]    Date of Admission:  05/08/2024      ID: Elizabeth Odom is a 83 y.o. female with  intra-abdominal infection Principal Problem:   Diverticular disease Active Problems:   A-fib (HCC)   Restless leg   ABLA (acute blood loss anemia)   Need for emotional support   Acute urinary retention   Mixed hyperlipidemia   Essential hypertension   Diverticular disease of left colon   History of DVT (deep vein thrombosis)   History of pulmonary embolus (PE)   Pre-diabetes   Presence of IVC filter   Stricture of sigmoid colon (HCC)   Protein-calorie malnutrition, severe   Hypokalemia   S/P percutaneous endoscopic gastrostomy (PEG) tube placement (HCC)   Goals of care, counseling/discussion   Shock, during or resulting from a surgical procedure    Subjective: Afebrile, no output from ostomy but liquid stool from rectum/rectal tube  Medications:   sodium chloride    Intravenous Once   acetaminophen   1,000 mg Per Tube Q6H   amLODipine   5 mg Oral QHS   vitamin C   500 mg Per Tube Daily   Chlorhexidine  Gluconate Cloth  6 each Topical Daily   ferrous sulfate   15 mg of iron  Per Tube BID   Gerhardt's butt cream   Topical BID   loperamide   4 mg Oral TID WC & HS   metoprolol  tartrate  12.5 mg Per Tube BID   nystatin    Topical BID   mouth rinse  15 mL Mouth Rinse 4 times per day   oxyCODONE   10 mg Oral QHS   pantoprazole   40 mg Oral Q1200   polycarbophil  625 mg Per Tube BID   sodium chloride  flush  10-40 mL Intracatheter Q12H   sodium chloride  flush  3 mL Intravenous Q12H    Objective: Vital signs in last 24 hours: Temp:  [97.5 F (36.4 C)-98.2 F (36.8 C)] 97.8 F (36.6 C) (07/04 1206) Pulse Rate:  [90-102] 95 (07/04 1206) Resp:  [16-18] 16 (07/04 1206) BP: (120-128)/(69-96) 120/70 (07/04 1206) SpO2:  [100 %] 100 % (07/04 1206) Weight:  [69.8 kg] 69.8 kg (07/04 0500)  Physical Exam  Constitutional:  oriented to person, place, and time.  appears well-developed and well-nourished. In mild discomfort HENT: [place]/AT, PERRLA, no scleral icterus Mouth/Throat: Oropharynx is clear and moist. No oropharyngeal exudate.  Cardiovascular: Normal rate, regular rhythm and normal heart sounds. Exam reveals no gallop and no friction rub.  No murmur heard.  Pulmonary/Chest: Effort normal and breath sounds normal. No respiratory distress.  has no wheezes.  Neck = supple, no nuchal rigidity Abdominal: Soft. Bowel sounds are decreased exhibits no distension. There is no tenderness. Ostomy bag is clean(placed yesterday) rectal tube watery stool Lymphadenopathy: no cervical adenopathy. No axillary adenopathy Neurological: alert and oriented to person, place, and time.  Skin: Skin is warm and dry. No rash noted. No erythema.  Psychiatric: a normal mood and affect.  behavior is normal.    Lab Results Recent Labs    06/12/24 0433 06/12/24 1851 06/13/24 0359  WBC 12.8*  --  9.5  HGB 6.8* 10.0* 9.6*  HCT 21.7* 31.0* 30.1*  NA 136  --   --   K 3.6  --   --   CL 108  --   --   CO2 21*  --   --   BUN 35*  --   --   CREATININE 0.74  --   --  Liver Panel No results for input(s): PROT, ALBUMIN , AST, ALT, ALKPHOS, BILITOT, BILIDIR, IBILI in the last 72 hours. Sedimentation Rate No results for input(s): ESRSEDRATE in the last 72 hours. C-Reactive Protein No results for input(s): CRP in the last 72 hours.  Microbiology: 7/2 fluid- PsA Studies/Results: CT GUIDED VISCERAL FLUID DRAIN BY PERC CATH Result Date: 06/12/2024 INDICATION: 83 year old with postoperative fluid collections. Plan for image guided aspiration or drainage. EXAM: CT-guided aspiration and drain placement of abdominal fluid collections MEDICATIONS: Moderate sedation ANESTHESIA/SEDATION: Moderate (conscious) sedation was employed during this procedure. A total of Versed  2mg  and fentanyl  100 mcg was administered intravenously at the order of the provider  performing the procedure. Total intra-service moderate sedation time: 60 minutes. Patient's level of consciousness and vital signs were monitored continuously by radiology nurse throughout the procedure under the supervision of the provider performing the procedure. COMPLICATIONS: None immediate. PROCEDURE: Informed written consent was obtained from the patient after a thorough discussion of the procedural risks, benefits and alternatives. All questions were addressed. Maximal Sterile Barrier Technique was utilized including caps, mask, sterile gowns, sterile gloves, sterile drape, hand hygiene and skin antiseptic. A timeout was performed prior to the initiation of the procedure. Patient was placed supine on CT scanner. Images through the abdomen were obtained without contrast. Evidence for new fluid collections in the anterior abdomen. Therefore, contrast enhanced CT of the abdomen was performed with 75 mL Omnipaque  300. The anterior abdomen was prepped with Betadine. Sterile field was created. Skin was anesthetized using 1% lidocaine . Using CT guidance, the 18 gauge trocar needle was directed into the anterior abdominal cavity and red cloudy fluid was aspirated. Superstiff Amplatz wire was advanced into the collection and wire position was confirmed with CT guidance. The tract was dilated to accommodate a 12 [place] multipurpose drain. The drain was not aspirating well and follow up CT images demonstrated that the drain went beyond the fluid collection. The drain was pulled back into the fluid collection and approximately 50 mL of cloudy red fluid was aspirated. No additional fluid could be obtained from the drain and the drain was not communicating with the other larger collections. Therefore, this drain was cut and completely removed. An area slightly more caudal to the drain site was prepped with chlorhexidine  and anesthetized with 1% lidocaine . 18 gauge needle was directed into this collection CT-guidance but  no fluid could be aspirated. Ultrasound was prepped at this time. Ultrasound demonstrated that the intra-abdominal fluid collections are severely complex with echogenic material probably representing blood products. Based on ultrasound findings, the needle was removed. Bandages were placed. RADIATION DOSE REDUCTION: This exam was performed according to the departmental dose-optimization program which includes automated exposure control, adjustment of the mA and/or kV according to patient size and/or use of iterative reconstruction technique. FINDINGS: Initial CT images were compared with a CT on 06/10/2024. Compared to the previous CT, there is new complex fluid in the left anterior abdomen containing gas. Again noted is high-density material layering within the fluid collections suggestive for blood products. Contrast enhanced CT was performed in order to better characterize these complex fluid collections. 12 French drain did not track within the larger collections and probably related to the severe loculations. This drain was pulled back into the anterior abdomen and approximately 50 mL of fluid was aspirated from the anterior component. This drain was completely removed. No fluid could be aspirated from the second needle placement. IMPRESSION: 1. Increased air-fluid collections in the left anterior abdomen compared to  the exam on 06/10/2024. These fluid collections are very complex with evidence of blood products. 2. CT-guided drain was placed in one of the anterior collections but only 50 mL of fluid could be obtained and the drain was not communicating with the other collections. Therefore, this drain was removed. Fluid was sent for culture. Electronically Signed   By: Juliene Balder M.D.   On: 06/12/2024 08:57     Assessment/Plan: Intra-abdominal abscess= will continue with piptazo. Discontinued micafungin  yesterday. Will stop daptomycin  today.  Agree with plan to repeat limited abdCT for tomorrow to see  if there is bowel disruption since nothing in ostomy  Diarrhea = has rectal tube in place to minimize dermatitis   Dr hatcher will be available for questions over the weekend and checking micro results  [facility] Pager: [phone]  06/13/2024, 3:40 PM

## 2024-06-13 NOTE — Progress Notes (Signed)
 Daily Progress Note   Patient Name: Elizabeth Odom       Date: 06/13/2024 DOB: July 16, 1941  Age: 83 y.o. MRN#: [id number] Attending Physician: [name], MD Primary Care Physician: Jesus Bernardino MATSU, MD Admit Date: 05/08/2024 Length of Stay: 36 days  Reason for Consultation/Follow-up: Establishing goals of care  Subjective:   Reviewed recent EMR documentation.  As per surgery documentation, patient having persistent diarrhea despite diverting loop colostomy.  Surgery unlikely to get further imaging to determine if patient is having diarrhea across the diverting loop versus any new fistula present.  Patient did not wish to have imaging today so surgery will hold and revisit discussion tomorrow.  Patient now restarted on TPN for anticipated bowel rest.  Noted patient likely had rectus sheath hematoma bleeding on heparin  and so now attempt to restart anticoagulation.  Surgery team adjusting as needed pain medications.  Surgery notes stating I do not think we have definitely had a point of futility but do not want her to have prolonged suffering either.  See if we can turn things around in the coming week.  As per prior conversation with granddaughter, surgery team has been updating patient's granddaughter regarding recommendations for medical care moving forward.  When presenting to bedside, patient was resting peacefully and had already told multiple providers including chaplain she wanted to be allowed to rest today.  This provider respected her wishes and allowed her to rest at this time.  Vital Signs:  BP 128/80 (BP Location: Left Arm)   Pulse (!) 102   Temp (!) 97.5 F (36.4 C) (Oral)   Resp 18   Ht 5' 5 (1.651 m)   Wt 69.8 kg   SpO2 100%   BMI 25.61 kg/m   Physical Exam: General: Sleeping comfortably cachectic, frail Cardiovascular: RRR Respiratory: no increased work of breathing noted, not in respiratory distress Neuro: Sleeping comfortably  Assessment & Plan:    Assessment: Patient is an 83 year old female with a past medical history of DVT, PE, atrial fibrillation, hypertension, mild cognitive impairment, and large bowel obstruction status post colonic resection and colostomy creation in 2020 for which has been complicated by anastomotic leak.  Patient presented last month for colostomy resection and takedown but had complicated postop course with dense adhesions, A-fib with RVR, intra-abdominal abscess, and poor oral intake requiring support of TPN and PEG placement.  Palliative medicine team consulted to assist with complex medical decision making.  Recommendations/Plan: # Complex medical decision making/goals of care:  - Allowed patient to rest today.  Had already discussed care with patient who continues to hope for slow improvements in strength.  Patient had noted that her goal is to eventually return home and be independent.  Acknowledged this while also expressing concerns about patient's level of care needs.    -At this time surgery team bleeding with granddaughter based on possibilities of appropriate surgical interventions to offer moving forward.  After talking with granddaughter previously, she appreciates discussions with surgical team regarding recommendations.  Awaiting further scans to determine patient's current cause of diarrhea despite diverting loop colostomy.  Should there be a point of exhausting all surgical possibilities, would recommend be discussed with patient's granddaughter as would greatly impact discussions about planning for medical care moving forward.  Palliative medicine team will continue to follow along and engage in conversations as able and appropriate. Noted palliative medicine team will continue to engage in conversations as able and appropriate.  -  Code Status: Full Code   Have readdressed  CODE STATUS with patient and granddaughter.  While patient stated that she would not want to be on life support, granddaughter  does not believe patient fully understands this-and would want attempts at resuscitation.  Have already encouraged further conversations between the 2 of them as concerned that if patient were sick enough her heart were to stop or she would stop breathing, interventions such as cardiac resuscitation and intubation with mechanical ventilation would not lead to quality of life outcomes and patient with multiple medical comorbidities.  # Symptom management: Surgical team managing patient's postop pain.  # Psychosocial support  - Already consulted chaplain to assist with spiritual support.  # Discharge Planning: To Be Determined  Thank you for allowing the palliative care team to participate in the care Elizabeth Odom.  Tinnie Radar, DO Palliative Care Provider PMT # [phone]  If patient remains symptomatic despite maximum doses, please call PMT at [phone] between 0700 and 1900. Outside of these hours, please call attending, as PMT does not have night coverage.

## 2024-06-13 NOTE — Progress Notes (Signed)
 PROGRESS NOTE    Elizabeth Odom  FMW:[id number] DOB: [birth year] DOA: 05/08/2024 PCP: Jesus Bernardino MATSU, MD   Brief Narrative: Elizabeth Odom is a 83 y.o. female with a history of atrial fibrillation, DVT, PE, anemia, PACs, hypertension, mild cognitive impairment, large bowel obstruction. Patient initially presented for surgical management involving history of diverticulitis with planned ostomy takedown with anastomosis. During hospitalization, patient developed altered mental status secondary to polypharmacy in addition to atrial fibrillation with RVR. Cardiology consulted for recommendation and management with subsequent improvement in RVR. On 6/24, patient underwent repeat laparoscopy converted to laparotomy with diverting loops transverse colostomy performed, in addition to drainage of an abdominal abscess and gastrostomy/rectal tube placement. Patient required admission to ICU post-operatively for respiratory failure but was extubated successfully on 6/25 and subsequent transferred out of ICU.    Assessment and Plan:  Persistent atrial fibrillation/flutter Cardiology consulted for management.  Medication adjusted to metoprolol  XL 25 mg daily with recommendation for Eliquis  when okay per surgery. Toprol -XL decreased to 12.5 mg daily. Eliquis  held and transitioned to heparin  IV. Heparin  now held secondary to acute anemia with concern for hematoma. -Continue Toprol -XL 12.5 mg daily  History of diverticulitis with perforation and abscess with colostomy status post ostomy takedown and re-do Ileus Per primary.  Infectious diseases consulted and are planning for management with multi week course of antibiotics.  Urine culture (6/12) significant for Pseudomonas, MRSA, Enterococcus faecalis, Candida albicans.  Drain culture (6/17) significant for rare Pseudomonas aeruginosa.  Ongoing antibiotics per infectious disease.  Patient now on daptomycin , micafungin , Zosyn . Patient underwent diverting  colostomy on 6/24 with gastrotomy tube placement. TPN weaned off. Tube feeds started. Tube feeds now held and TPN re-ordered. -Ongoing general surgery recommendations  Intraabdominal abscess Infectious disease consulted. Culture data is polymicrobial and evident for pseudomonas aeruginosa, staphylococcus aureus and candida albicans. -ID recommendations: Zosyn , daptomycin , micafungin   Acute blood loss anemia Initially secondary to surgery, requiring multiple transfusions. Patient with recurrent significant anemia with concern for intraabdominal abscess vs hematoma. Patient then developed blood per rectum requiring multiple transfusions. Patient has received a total of 9 units of PRBC via transfusion. -Follow-up post-transfusion H&H and transfuse as needed -Ongoing general surgery recommendations  Rectal output Unclear source as patient has a diverting colostomy, however she has minimal output from colostomy. Concern for possible fistula. -Per general surgery  AKI Mild. Resolved.  Hypoalbuminemia Noted. Associated poor nutrition.  Leukocytosis In setting of surgery. Improving.  Thrombocytosis Noted. Likely reactive. Mildly elevated and now resolved.  Hyponatremia Mild.  Syncope Syncopal episode occurred during hospitalization while patient was on commode.  Concern for possible vasovagal versus hypotension etiology.  Transthoracic echo obtained with evidence of preserved LVEF of 60 to 65% in addition to no aortic stenosis.  Acute urinary retention Foley inserted on 6/4. Flomax  started. Foley removed on 6/13. Flomax  discontinued.  Acute metabolic encephalopathy Delirium Resolved.  Generalized weakness Physical therapy is ordered already.  Discussed with patient that is important for her to participate as much as possible with therapy to improve her strength.   DVT prophylaxis: Heparin  IV (held). SCDs Code Status:   Code Status: Full Code Family Communication: None at  bedside Disposition Plan: Discharge pending continued general surgery and ID recommendations. Anticipate discharge to SNF when medically ready.   Consultants:  General surgery PCCM Infectious disease  Procedures:    Antimicrobials:     Subjective: Abdominal pain mostly on the lest side. Otherwise no concerns  Objective: BP 120/70 (BP Location: Left Arm)  Pulse 95   Temp 97.8 F (36.6 C) (Oral)   Resp 16   Ht 5' 5 (1.651 m)   Wt 69.8 kg   SpO2 100%   BMI 25.61 kg/m   Examination:  General exam: Appears calm and comfortable. Chronically ill appearing. Respiratory system: Clear to auscultation. Respiratory effort normal. Cardiovascular system: S1 & S2 heard, irregular rhythm with slightly fast rate. Gastrointestinal system: Abdomen is mildly distended, soft and generally tender. Decreased bowel sounds heard. Central nervous system: Alert and oriented. No focal neurological deficits. Psychiatry: Judgement and insight appear normal. Mood & affect appropriate.    Data Reviewed: I have personally reviewed following labs and imaging studies  CBC Lab Results  Component Value Date   WBC 9.5 06/13/2024   RBC 3.39 (L) 06/13/2024   HGB 9.6 (L) 06/13/2024   HCT 30.1 (L) 06/13/2024   MCV 88.8 06/13/2024   MCH 28.3 06/13/2024   PLT 335 06/13/2024   MCHC 31.9 06/13/2024   RDW 18.4 (H) 06/13/2024   LYMPHSABS 0.7 05/09/2024   MONOABS 1.8 (H) 05/09/2024   EOSABS 0.0 05/09/2024   BASOSABS 0.0 05/09/2024     Last metabolic panel Lab Results  Component Value Date   NA 136 06/12/2024   K 3.6 06/12/2024   CL 108 06/12/2024   CO2 21 (L) 06/12/2024   BUN 35 (H) 06/12/2024   CREATININE 0.74 06/12/2024   GLUCOSE 172 (H) 06/12/2024   GFRNONAA >60 06/12/2024   GFRAA >60 04/10/2016   CALCIUM  7.8 (L) 06/12/2024   PHOS 3.9 06/12/2024   PROT 6.1 (L) 06/09/2024   ALBUMIN  1.5 (L) 06/09/2024   LABGLOB 2.9 04/08/2024   BILITOT 1.4 (H) 06/09/2024   ALKPHOS 196 (H) 06/09/2024    AST 22 06/09/2024   ALT 21 06/09/2024   ANIONGAP 7 06/12/2024    GFR: Estimated Creatinine Clearance: 52.2 mL/min (by C-G formula based on SCr of 0.74 mg/dL).  Recent Results (from the past 240 hours)  Aerobic/Anaerobic Culture w Gram Stain (surgical/deep wound)     Status: None (Preliminary result)   Collection Time: 06/11/24  4:12 PM   Specimen: Abdomen; Abdominal Fluid  Result Value Ref Range Status   Specimen Description   Final    ABDOMEN Performed at [facility], [address] [place], [place] [address]    Special Requests   Final    ABDOMINAL FLUID Performed at [facility], [address] [place], [place] [address]    Gram Stain   Final    ABUNDANT WBC PRESENT, PREDOMINANTLY PMN ABUNDANT GRAM NEGATIVE RODS FEW GRAM POSITIVE COCCI    Culture   Final    MODERATE PSEUDOMONAS AERUGINOSA SUSCEPTIBILITIES TO FOLLOW HOLDING FOR POSSIBLE ANAEROBE Performed at [facility], [address] [place], [place] [address]    Report Status PENDING  Incomplete      Radiology Studies: CT GUIDED VISCERAL FLUID DRAIN BY PERC CATH Result Date: 06/12/2024 INDICATION: 83 year old with postoperative fluid collections. Plan for image guided aspiration or drainage. EXAM: CT-guided aspiration and drain placement of abdominal fluid collections MEDICATIONS: Moderate sedation ANESTHESIA/SEDATION: Moderate (conscious) sedation was employed during this procedure. A total of Versed  2mg  and fentanyl  100 mcg was administered intravenously at the order of the provider performing the procedure. Total intra-service moderate sedation time: 60 minutes. Patient's level of consciousness and vital signs were monitored continuously by radiology nurse throughout the procedure under the supervision of the provider performing the procedure. COMPLICATIONS: None immediate. PROCEDURE: Informed written  consent was obtained from the patient after a thorough discussion of  the procedural risks, benefits and alternatives. All questions were addressed. Maximal Sterile Barrier Technique was utilized including caps, mask, sterile gowns, sterile gloves, sterile drape, hand hygiene and skin antiseptic. A timeout was performed prior to the initiation of the procedure. Patient was placed supine on CT scanner. Images through the abdomen were obtained without contrast. Evidence for new fluid collections in the anterior abdomen. Therefore, contrast enhanced CT of the abdomen was performed with 75 mL Omnipaque  300. The anterior abdomen was prepped with Betadine. Sterile field was created. Skin was anesthetized using 1% lidocaine . Using CT guidance, the 18 gauge trocar needle was directed into the anterior abdominal cavity and red cloudy fluid was aspirated. Superstiff Amplatz wire was advanced into the collection and wire position was confirmed with CT guidance. The tract was dilated to accommodate a 12 [place] multipurpose drain. The drain was not aspirating well and follow up CT images demonstrated that the drain went beyond the fluid collection. The drain was pulled back into the fluid collection and approximately 50 mL of cloudy red fluid was aspirated. No additional fluid could be obtained from the drain and the drain was not communicating with the other larger collections. Therefore, this drain was cut and completely removed. An area slightly more caudal to the drain site was prepped with chlorhexidine  and anesthetized with 1% lidocaine . 18 gauge needle was directed into this collection CT-guidance but no fluid could be aspirated. Ultrasound was prepped at this time. Ultrasound demonstrated that the intra-abdominal fluid collections are severely complex with echogenic material probably representing blood products. Based on ultrasound findings, the needle was removed. Bandages were placed. RADIATION DOSE REDUCTION: This exam was performed according to the departmental dose-optimization  program which includes automated exposure control, adjustment of the mA and/or kV according to patient size and/or use of iterative reconstruction technique. FINDINGS: Initial CT images were compared with a CT on 06/10/2024. Compared to the previous CT, there is new complex fluid in the left anterior abdomen containing gas. Again noted is high-density material layering within the fluid collections suggestive for blood products. Contrast enhanced CT was performed in order to better characterize these complex fluid collections. 12 French drain did not track within the larger collections and probably related to the severe loculations. This drain was pulled back into the anterior abdomen and approximately 50 mL of fluid was aspirated from the anterior component. This drain was completely removed. No fluid could be aspirated from the second needle placement. IMPRESSION: 1. Increased air-fluid collections in the left anterior abdomen compared to the exam on 06/10/2024. These fluid collections are very complex with evidence of blood products. 2. CT-guided drain was placed in one of the anterior collections but only 50 mL of fluid could be obtained and the drain was not communicating with the other collections. Therefore, this drain was removed. Fluid was sent for culture. Electronically Signed   By: Juliene Balder M.D.   On: 06/12/2024 08:57       LOS: 36 days    Elgin Lam, MD [facility] 06/13/2024, 1:20 PM   If 7PM-7AM, please contact night-coverage [email or web address]

## 2024-06-13 NOTE — Progress Notes (Signed)
 Pharmacy called ;TNA still not available. IVT numbers given to call once TNA has arrived to be infused.

## 2024-06-13 NOTE — Progress Notes (Signed)
 Nutrition Follow-up  DOCUMENTATION CODES:   Severe malnutrition in context of chronic illness  INTERVENTION:  - TPN to restart tonight.   - TPN per pharmacy.   - Clear Liquid diet.   - Once able to restart tube feeds, would recommend transition back to 24 hour feeds for better tolerance: Osmolite 1.5 @ 50 ml/hr *Consider starting at 20 ml/hr and advancing by 10 ml every 4 hours  Provides 1800 kcals, 75g protein and 914 ml H2O   - Monitor weight trends.  NUTRITION DIAGNOSIS:   Severe Malnutrition related to chronic illness as evidenced by severe fat depletion, severe muscle depletion. *ongoing  GOAL:   Patient will meet greater than or equal to 90% of their needs *progressing, restarting TPN  MONITOR:   Diet advancement, Labs, Weight trends  REASON FOR ASSESSMENT:   Consult New TPN/TNA  ASSESSMENT:   83 year old female with PMH HTN, mild cognitive impairment, asd large bowel obstruction (October 2024) s/p Hartman's resection and colostomy creation who was admitted 5/29 for colostomy reversal.  5/29 Admit; s/p colostomy reversal, small bowel resection, and extensive lysis of adhesions; CLD 5/30 Soft diet -> FLD 6/2 NPO; NGT placed; TPN to be initiated 6/3 CLD 6/4-6/5 Dysphagia 1 -> Soft-> Reg diet, Calorie Count #1 completed, consumed 11-14% of needs 6/6 Soft diet 6/7-6/8 Calorie Count #2 completed, consumed 10-22% of needs 6/9 Heart Healthy -> NPO 6/10 s/p drain placement 6/11 CLD 6/12 FLD -> Regular diet -> soft diet 6/13-6/15 Regular diet, Calorie Count #3 -consumed 1-4% of needs 6/16 Heart Healthy diet 6/17 NPO ->Regular diet 6/19-6/20 Calorie Count #4 -pt consumed 0-10% of needs 6/24 TPN increased back to goal; NPO; s/p G-tube placement, diverting loop colostomy, drain placement, returned to ICU intubated 6/25 Extubated 6/26 Trickle feeds to be started via G-tube; SLP eval -> ice chips prn 6/30 TF advancing to goal rate, SLP recommended DYS 1 diet  7/1  Surgery transitioned patient to nocturnal TF regimen of 75mL x16 hours; TPN weaned off 7/2 CLD 7/3 Patient developed bloody diarrhea out of anus; TF's held 7/4 TPN restarting at 42mL/hr  Patient began developing bloody diarrhea out of anus and tube feeds held since 7/3.  No PO intake documented since 6/29.  Per surgery restarting TPN tonight in anticipation of needed bowel rest with possible ileus.  Pharmacy ordered TPN to start at 8mL/hr tonight and plan to reassess labs tomorrow and if tolerating plan to increase back to goal to meet nutritional needs tomorrow.   Admit weight: 127# Current weight: 153# I&O's: +17.1L since 6/20  Medications: 500mg  Vitamin C  daily, 325 mg Ferrous sulfate , Imodium  QID, Fibercon BID, D5 @ 745mL/hr (provides 306 kcals over 24 hours - expires at 17:59)  Labs reviewed:  No BMP today Triglycerides 205 (as of 6/30)   Diet Order:   Diet Order             Diet clear liquid Room service appropriate? Yes; Fluid consistency: Thin  Diet effective now           Diet - low sodium heart healthy                   EDUCATION NEEDS:  Education needs have been addressed  Skin:  Skin Assessment: Skin Integrity Issues: Skin Integrity Issues:: Stage II, Incisions Stage II: Right Buttocks Incisions: Abdomen  Last BM:  7/3 - rectal tube and colostomy  Height:  Ht Readings from Last 1 Encounters:  05/09/24 5' 5 (1.651 m)  Weight:  Wt Readings from Last 1 Encounters:  06/13/24 69.8 kg   BMI:  Body mass index is 25.61 kg/m.  Estimated Nutritional Needs:  Kcal:  1600-1750 kcals Protein:  80-95 grams Fluid:  >/= 1.6L    Trude Ned RD, LDN Contact via Secure Chat.

## 2024-06-13 NOTE — Plan of Care (Signed)
  Problem: Nutritional: Goal: Will attain and maintain optimal nutritional status will improve Outcome: Progressing   Problem: Skin Integrity: Goal: Will show signs of wound healing Outcome: Progressing   Problem: Clinical Measurements: Goal: Respiratory complications will improve Outcome: Progressing

## 2024-06-13 NOTE — Progress Notes (Signed)
 06/13/2024  Elizabeth Odom [id number] 1941-08-17  CARE TEAM: PCP: Jesus Bernardino MATSU, MD  Outpatient Care Team: Patient Care Team: Jesus Bernardino MATSU, MD as PCP - General (Internal Medicine) [name], MD as Consulting Physician (Gastroenterology) Rubin Calamity, MD as Consulting Physician (General Surgery) [name], MD (Neurology) [name], MD as Consulting Physician (Urology) [name], MD as Consulting Physician (Cardiology) [name], MD as Consulting Physician (Colon and Rectal Surgery)  Inpatient Treatment Team: Treatment Team:  [name], MD Dennise Kingsley, MD [name], RN [name], MD Jennefer Ester PARAS, MD Allred, Harman POUR, PA-C Bobbette Clause, MD [name], DELENA, OT Kermit Handler, RN Briana Elgin DELENA, MD Kriste Asberry BRAVO, RN Lovetta Lenis, NT [place], Tawni HERO, LCSW   Problem List:   Principal Problem:   Diverticular disease Active Problems:   A-fib [name])   Restless leg   ABLA (acute blood loss anemia)   Need for emotional support   Acute urinary retention   Mixed hyperlipidemia   Essential hypertension   Diverticular disease of left colon   History of DVT (deep vein thrombosis)   History of pulmonary embolus (PE)   Pre-diabetes   Presence of IVC filter   Stricture of sigmoid colon (HCC)   Protein-calorie malnutrition, severe   Hypokalemia   S/P percutaneous endoscopic gastrostomy (PEG) tube placement (HCC)   Goals of care, counseling/discussion   05/08/2024  POST-OPERATIVE DIAGNOSIS:   COLOSTOMY FOR RESECTION, DESIRE FOR OSTOMY TAKEDOWN RECTAL STRICTURE HISTORY OF DIVERTICULITIS WITH PERFORATION & ABSCESS   PROCEDURE:   -ROBOTIC RECTOSIGMOID RESECTION (LAR) -TAKEDOWN OF END COLOSTOMY WITH ANASTOMOSIS -RESECTION OF SMALL INTESTINE WITH ANASTOMOSIS -SMALL BOWEL REPAIR -LYSIS OF ADHESIONS x 115 MINUTES (66% OF CASE),  -INTRAOPERATIVE ASSESSMENT OF TISSUE VASCULAR PERFUSION USING ICG  (indocyanine green ) IMMUNOFLUORESCENCE,  -TRANSVERSUS ABDOMINIS PLANE (TAP) BLOCK - BILATERAL -FLEXIBLE SIGMOIDOSCOPY   SURGEON:  [name], MD   05/20/2024  Post procedural Dx: Post op abscess, multiple   Technically successful CT guided placed of a  10 Fr drainage catheter placement x 2  into the midline ventral and dorsal perirectal abscesses.     [name] DELENA, MD    05/28/2024  IMPRESSION: Satisfactory removal of the midline anterior abscess drainage catheter and placement of a new midline anterior pelvic drainage catheter as well as a posterior transgluteal drainage catheter in pelvic abscesses.     Electronically Signed   By: [name]    06/03/2024  POST-OPERATIVE DIAGNOSIS:   DELAYED COLORECTAL ANASTOMOTIC LEAK, NEED FOR FECAL DIVERSION CHRONIC PELVIC ABSCESS   PROCEDURE:   DIAGNOSTIC LAPAROSCOPY CONVERTED TO LAPAROTOMY DIVERTING LOOP TRANSVERSE COLOSTOMY DRAINAGE OF ABDOMINAL ABSCESS GASTROSTOMY TUBE PLACEMENT RECTAL TUBE PLACEMENT   SURGEON:  [name], MD   OR FINDINGS: Very dense concrete intraperitoneal and pelvic adhesions infraumbilically.  Bladder, small bowel, omentum walling off the lower pelvis with delayed anastomotic leak.   Near disruption of colorectal anastomosis by transanal examination.  Rectal tube placed through rectal stump to drain the pelvis more directly.   Diverting mid-transverse loop colostomy done.   Removal of old percutaneous abdominal and transgluteal drains.     Assessment [facility] Stay = 36 days) 10 Days Post-Op    Failure to thrive with delayed anastomotic leak     Plan:  Delayed anastomotic leak   status post percutaneous drainage with some improvement and then decline after drain fell out and no improvement after replacement drainage.  Diverting loop transverse colostomy (lower abdomen  extremely fixed).  6/25 with very fixed abdomen.  She had been tolerating tube feeds at goal but  now having diarrhea out anus.  Do not know if she is having diarrhea across the diverting loop vs she has any new fistula down to the pelvis/rectum.  Hold TFs and regroup  Because of persistent diarrhea and leaking, okay with doing rectal tube for now.  She has pressure and discomfort but is glad not to be sitting in diarrhea.  WTD packing incision since superficial  I suspect they will need to do a well contrasted CAT scan to rule out any leak but she is exhausted with scans and procedures and wishes to hold off today.  That is not unreasonable.  Revisit tomorrow.  Severe malnutrition   Hold tube feeds & regroup  Keep tube to gravity since seems to be having ileus.  Restart TPN for now in anticipation of needed bowel rest   Infection: -Zosyn , Daptomycin , Micafungin  per ID  Surgical drain serosanguineous with no evidence of the leak or abscess nearby.  Removed 7/1  Bloody fluid collection aspirated 7/2 by Dr. Philip with interventional radiology.  He did not leave a drain.  Larger collection not able to have drain placed.  Wonder if it is clotted hematoma which explain her drop in hemoglobin.    Culture growing Pseudomonas again.  Would be helpful to get sensitivities to see if she is growing a more drug-resistant organism.    Followed by infectious disease.    Midline wound superficial enough to do just packing for now  Some recurrent atrial fibrillation and rapid rate.    PO/Gtube metoprolol .  Add back amlodipine  since starting to get hypertensive again.  See if medicine agrees.  I think medicine placed back on telemetry.  Will defer back to them.    Acute blood loss anemia in the setting of anemia of chronic disease.    Transfused 6/26, 7/2.  Hemoglobin improved from 10-9.6 this morning.  Follow.    Suspect she has rectus sheath hematoma bleeding on heparin .  That is held.  Hemoglobin more stable currently reassuring.  I think she has failed anticoagulation again.  I  would be very hesitant to restart it.  No evidence of coagulopathy or thrombocytopenia.  Of note, she had a prior workup and follow-up that did not show any coagulopathy by Dr. Lanny with hematology.  May reconsult if persistent oozing/bleeding  IV iron .    Enteral iron  with vitamin C  6/30.  I would not trust enteral route to be totally adequate but may help  History of urinary retention.  Will keep Foley catheter for now.  May consider removing with intermittent I&O cath or Flexi-Seal if needed.  Get CAT scan and diuresis first.  See if medicine agrees.    -monitor electrolytes & replace as needed.  Keep K>4, Mg>2, Phos>3.  Intermittent potassium supplementation.  Will give enteral potassium x 5 days and follow since I suspect she will need more diuresis.  See if medicine agrees  -Diuresis as tolerated.  Follow-up with anemia needing transfusion.  -Pain control.  Scheduled Tylenol .  More alert so I think we can increase her oxycodone  dose since she struggles with pain.  10 mg at night to help her sleep.  control  -mobilize as tolerated to help recovery.  Try and get therapists involved to help her mobilize more.  Biggest challenge.  Patient does not like the idea of going to a skilled facility or rehab as she needed  after her month hospital stay last year from emergency surgery.  I do not think that that is avoidable at this time. Work to have strategies to help her get up and mobilize and actually work with physical therapy, or she is going to lose her argument.    I updated the patient's status to the patient and nurse.  Called discussed with the patient's granddaughter who closely follows her 170/1.  Recommendations were made.  Questions were answered.  They expressed understanding & appreciation.  -Disposition: TBD.  There is no way she can go straight home.  She will need skilled nursing facility and rehab in the past.  Patient still challenged with recurring issues.  Agree with palliative  care.  I do not think we have definitely had a point of futility but do not want her to have prolonged suffering either.  See if we can turn things around in this coming week.     I reviewed last 24 h vitals and pain scores, last 48 h intake and output, last 24 h labs and trends, and last 24 h imaging results.  I have reviewed this patient's available data, including medical history, events of note, test results, etc as part of my evaluation.   A significant portion of that time was spent in counseling. Care during the described time interval was provided by me.  This care required moderate level of medical decision making.  06/13/2024    Subjective: (Chief complaint)  Persistent diarrhea.  Flexi-Seal tube placed.  Diverting away diarrhea.  Patient sitting up.  Notes soreness in left abdomen.  Pain medicine somewhat helping.  No nausea or vomiting.  Nursing in room.  Hospitalist Dr. Briana just saw patient.    Objective:  Vital signs:  Vitals:   06/12/24 2023 06/13/24 0411 06/13/24 0500 06/13/24 0817  BP: (!) 123/96 128/80  125/69  Pulse: 90 (!) 102  (!) 102  Resp: 18 18  16   Temp: 97.7 F (36.5 C) (!) 97.5 F (36.4 C)  98.2 F (36.8 C)  TempSrc: Oral Oral    SpO2: 100% 100%  100%  Weight:   69.8 kg   Height:        Last BM Date : 06/12/24  Intake/Output   Yesterday:  07/03 0701 - 07/04 0700 In: 4747 [P.O.:480; I.V.:41.5; Blood:804; NG/GT:1046.3; IV Piggyback:2375.3] Out: 1450 [Urine:750; Drains:480; Stool:220] This shift:  Total I/O In: 751.4 [I.V.:702.8; IV Piggyback:48.5] Out: -   Bowel function:  Flatus: YES  BM:  YES   Drain:  Gastrostomy tube with thin bilious effluent and gravity bag Foley catheter with clear yellow urine Rectal tube with thin green bilious effluent   Physical Exam:  General: Awake.  Sitting up.  More alert.             Eyes: PERRL, normal EOM.  Sclera clear.  No icterus Neuro: CN II-XII intact w/o focal sensory/motor  deficits.  No facial droop. Lymph: No head/neck/groin lymphadenopathy Psych:  No delerium/psychosis/paranoia.  Oriented x 4 HENT: Normocephalic, Mucus membranes moist.  No thrush.  Mild HOH Neck: Supple, No tracheal deviation.  No obvious thyromegaly.  Chest: No pain to chest wall compression.  Good respiratory excursion.  Mild audible wheezing CV:  Pulses intact.  Regular rhythm.  No major extremity edema MS: Normal AROM mjr joints.  No obvious deformity  Abdomen:  Most of abdomen soft but left upper quadrant with some firmness consistent with rectus sheath abdominal wall hematoma.  Moderately distended.  Mildly tender  at incisions only.  No guarding.   Right upper quadrant mid transverse diverting loop colostomy pink with edema.  No gas nor stool Midline incision with some granulation. Gastrostomy tube left upper quadrant without any leaking   GU: Foley in place with light yellow clear urine  Rectal: Flexi-Seal tube in. Ext:   No deformity.  No mjr edema.  No cyanosis Skin: No petechiae / purpurea.  No major sores.  Warm and dry    Results:   Cultures: Recent Results (from the past 720 hours)  MIC (1 Drug)-Abdominal drain abscess; 05/22/2024; Abdomen; MRSA; Daptomycin      Status: Abnormal   Collection Time: 05/22/24  1:54 PM   Specimen: Abdomen  Result Value Ref Range Status   Min Inhibitory Conc (1 Drug) Final report (A)  Corrected    Comment: (NOTE) Performed At: [facility] [address] [place], [place] [id number] Jennette Shorter MD Ey:[id number] CORRECTED ON 06/24 AT 0836: PREVIOUSLY REPORTED AS Preliminary report    Source ABSCESS  Final    Comment: Performed at [facility], [address], [place], [place] [address]  MIC Result     Status: Abnormal   Collection Time: 05/22/24  1:54 PM  Result Value Ref Range Status   Result 1 (MIC) Comment (A)  Final    Comment: (NOTE) Methicillin - resistant Staphylococcus aureus Identification performed by  account, not confirmed by this laboratory. DAPTOMYCIN    <=1 UG/ML = SUSCEPTIBLE Performed At: [facility] [address] [place], [place] [id number] Jennette Shorter MD Ey:[id number]   Aerobic/Anaerobic Culture w Gram Stain (surgical/deep wound)     Status: None   Collection Time: 05/22/24  3:59 PM   Specimen: Abdomen  Result Value Ref Range Status   Specimen Description   Final    ABDOMEN Performed at [facility], [address] [place], [place] [address]    Special Requests   Final    ABDOMINAL DRAIN Performed at [facility], [address], [place], [place] [address]    Gram Stain   Final    ABUNDANT WBC PRESENT, PREDOMINANTLY PMN RARE GRAM POSITIVE COCCI RARE YEAST WITH PSEUDOHYPHAE    Culture   Final    MODERATE PSEUDOMONAS AERUGINOSA MODERATE STAPHYLOCOCCUS AUREUS SUSCEPTIBILITIES PERFORMED ON PREVIOUS CULTURE WITHIN THE LAST 5 DAYS. MODERATE CANDIDA ALBICANS SEE SEPARATE REPORT NO ANAEROBES ISOLATED Performed at [facility], [address], [place], [place] [address]    Report Status 06/04/2024 FINAL  Final   Organism ID, Bacteria PSEUDOMONAS AERUGINOSA  Final      Susceptibility   Pseudomonas aeruginosa - MIC*    CEFTAZIDIME 4 SENSITIVE Sensitive     CIPROFLOXACIN <=0.25 SENSITIVE Sensitive     GENTAMICIN 4 SENSITIVE Sensitive     IMIPENEM 2 SENSITIVE Sensitive     PIP/TAZO <=4 SENSITIVE Sensitive ug/mL    CEFEPIME 2 SENSITIVE Sensitive     * MODERATE PSEUDOMONAS AERUGINOSA  Aerobic/Anaerobic Culture w Gram Stain (surgical/deep wound)     Status: None   Collection Time: 05/22/24  3:59 PM   Specimen: Back  Result Value Ref Range Status   Specimen Description   Final    BACK Performed at [facility], [address], [place], [place] [address]    Special Requests   Final    TRANSGLUTEAL PERCUTANEOUS IR DRAIN Performed at [facility], [address]  [place], [place] [address]    Gram Stain   Final  NO WBC SEEN ABUNDANT GRAM NEGATIVE RODS RARE GRAM POSITIVE COCCI IN PAIRS RARE BUDDING YEAST SEEN    Culture   Final    ABUNDANT PSEUDOMONAS AERUGINOSA ABUNDANT METHICILLIN RESISTANT STAPHYLOCOCCUS AUREUS ABUNDANT ENTEROCOCCUS FAECALIS ABUNDANT CANDIDA ALBICANS NO ANAEROBES ISOLATED Sent to [facility] for further susceptibility testing. Performed at [facility], [address], [place], [place] [address]    Report Status 05/27/2024 FINAL  Final   Organism ID, Bacteria PSEUDOMONAS AERUGINOSA  Final   Organism ID, Bacteria METHICILLIN RESISTANT STAPHYLOCOCCUS AUREUS  Final   Organism ID, Bacteria ENTEROCOCCUS FAECALIS  Final      Susceptibility   Enterococcus faecalis - MIC*    AMPICILLIN <=2 SENSITIVE Sensitive     VANCOMYCIN  1 SENSITIVE Sensitive     GENTAMICIN SYNERGY SENSITIVE Sensitive     * ABUNDANT ENTEROCOCCUS FAECALIS   Methicillin resistant staphylococcus aureus - MIC*    CIPROFLOXACIN >=8 RESISTANT Resistant     ERYTHROMYCIN  >=8 RESISTANT Resistant     GENTAMICIN <=0.5 SENSITIVE Sensitive     OXACILLIN >=4 RESISTANT Resistant     TETRACYCLINE <=1 SENSITIVE Sensitive     VANCOMYCIN  1 SENSITIVE Sensitive     TRIMETH/SULFA >=320 RESISTANT Resistant     CLINDAMYCIN <=0.25 SENSITIVE Sensitive     RIFAMPIN <=0.5 SENSITIVE Sensitive     Inducible Clindamycin NEGATIVE Sensitive     LINEZOLID 2 SENSITIVE Sensitive     * ABUNDANT METHICILLIN RESISTANT STAPHYLOCOCCUS AUREUS   Pseudomonas aeruginosa - MIC*    CEFTAZIDIME 4 SENSITIVE Sensitive     CIPROFLOXACIN <=0.25 SENSITIVE Sensitive     GENTAMICIN 4 SENSITIVE Sensitive     IMIPENEM 2 SENSITIVE Sensitive     PIP/TAZO <=4 SENSITIVE Sensitive ug/mL    CEFEPIME 2 SENSITIVE Sensitive     * ABUNDANT PSEUDOMONAS AERUGINOSA  Yeast Susceptibilities     Status: None   Collection Time: 05/22/24  3:59 PM  Result Value Ref Range Status   SOURCE CANDIDA ALBICAN ABD ABSC  Final     Comment: Performed at [facility], [address] [place], [place] [address]   Organism ID, Yeast Candida albicans  Final    Comment: (NOTE) Identification performed by account, not confirmed by this laboratory.    Amphotericin B MIC 0.25 ug/mL  Final    Comment: (NOTE) Breakpoints have been established for only some organism-drug combinations as indicated. This test was developed and its performance characteristics determined by [facility]. It has not been cleared or approved by the Food and Drug Administration.    Anidulafungin MIC Comment  Final    Comment: (NOTE) 0.03 ug/mL Susceptible Breakpoints have been established for only some organism-drug combinations as indicated. This test was developed and its performance characteristics determined by [facility]. It has not been cleared or approved by the Food and Drug Administration.    Caspofungin MIC Comment  Final    Comment: (NOTE) 0.12 ug/mL Susceptible Breakpoints have been established for only some organism-drug combinations as indicated. This test was developed and its performance characteristics determined by [facility]. It has not been cleared or approved by the Food and Drug Administration.    Fluconazole  Islt MIC 1.0 ug/mL Susceptible  Final    Comment: (NOTE) Breakpoints have been established for only some organism-drug combinations as indicated. This test was developed and its performance characteristics determined by [facility]. It has not been cleared or approved by the Food and Drug Administration.    ISAVUCONAZOLE MIC 0.008 ug/mL or less  Final  Comment: (NOTE) This test was developed and its performance characteristics determined by [facility]. It has not been cleared or approved by the Food and Drug Administration.    Itraconazole MIC 0.06 ug/mL  Final    Comment: (NOTE) Breakpoints have been established for only some organism-drug combinations as indicated. This test was developed and its performance  characteristics determined by [facility]. It has not been cleared or approved by the Food and Drug Administration.    Micafungin  MIC Comment  Final    Comment: (NOTE) 0.008 ug/mL or less, Susceptible Breakpoints have been established for only some organism-drug combinations as indicated. This test was developed and its performance characteristics determined by [facility]. It has not been cleared or approved by the Food and Drug Administration.    Posaconazole MIC 0.06 ug/mL  Final    Comment: (NOTE) Breakpoints have been established for only some organism-drug combinations as indicated. This test was developed and its performance characteristics determined by [facility]. It has not been cleared or approved by the Food and Drug Administration.    REZAFUNGIN MIC Comment  Final    Comment: (NOTE) 0.15 ug/mL Susceptible This test was developed and its performance characteristics determined by [facility]. It has not been cleared or approved by the Food and Drug Administration.    Voriconazole MIC Comment  Final    Comment: (NOTE) 0.15 ug/mL Susceptible Breakpoints have been established for only some organism-drug combinations as indicated. This test was developed and its performance characteristics determined by [facility]. It has not been cleared or approved by the Food and Drug Administration. Performed At: [facility] [address] [place], [place] [id number] Jennette Shorter MD Ey:[id number]   Aerobic/Anaerobic Culture w Gram Stain (surgical/deep wound)     Status: None   Collection Time: 05/27/24  5:19 PM   Specimen: Abscess  Result Value Ref Range Status   Specimen Description   Final    ABSCESS ABDOMEN Performed at [facility], [address], [place], [place] [address]    Special Requests   Final    NONE Performed at [facility], [address] [place], [place] [address]    Gram Stain   Final    ABUNDANT WBC PRESENT,  PREDOMINANTLY PMN NO ORGANISMS SEEN    Culture   Final    RARE PSEUDOMONAS AERUGINOSA RARE CANDIDA ALBICANS NO ANAEROBES ISOLATED Performed at [facility], [address] [place], [place] [address]    Report Status 06/01/2024 FINAL  Final   Organism ID, Bacteria PSEUDOMONAS AERUGINOSA  Final      Susceptibility   Pseudomonas aeruginosa - MIC*    CEFTAZIDIME 4 SENSITIVE Sensitive     CIPROFLOXACIN <=0.25 SENSITIVE Sensitive     GENTAMICIN 4 SENSITIVE Sensitive     IMIPENEM 2 SENSITIVE Sensitive     PIP/TAZO <=4 SENSITIVE Sensitive ug/mL    CEFEPIME 2 SENSITIVE Sensitive     * RARE PSEUDOMONAS AERUGINOSA  Surgical pcr screen     Status: Abnormal   Collection Time: 06/02/24  7:55 PM   Specimen: Nasal Mucosa; Nasal Swab  Result Value Ref Range Status   MRSA, PCR POSITIVE (A) NEGATIVE Final   Staphylococcus aureus POSITIVE (A) NEGATIVE Final    Comment: (NOTE) The Xpert SA Assay (FDA approved for NASAL specimens in patients 67 years of age and older), is one component of a comprehensive surveillance program. It is not intended to diagnose infection nor to guide or monitor treatment. Performed at [facility],  [address] [place], [place] [address]   Aerobic/Anaerobic Culture w Gram Stain (surgical/deep wound)     Status: None (Preliminary result)   Collection Time: 06/11/24  4:12 PM   Specimen: Abdomen; Abdominal Fluid  Result Value Ref Range Status   Specimen Description   Final    ABDOMEN Performed at [facility], [address], [place], [place] [address]    Special Requests   Final    ABDOMINAL FLUID Performed at [facility], [address], [place], [place] [address]    Gram Stain   Final    ABUNDANT WBC PRESENT, PREDOMINANTLY PMN ABUNDANT GRAM NEGATIVE RODS FEW GRAM POSITIVE COCCI    Culture   Final    MODERATE PSEUDOMONAS AERUGINOSA SUSCEPTIBILITIES TO FOLLOW Performed at [facility], [address], [place], [place] [address]    Report Status PENDING  Incomplete    Labs: Results for orders placed or performed during the hospital encounter of 05/08/24 (from the past 48 hours)  Glucose, capillary     Status: None   Collection Time: 06/11/24 11:30 AM  Result Value Ref Range   Glucose-Capillary 92 70 - 99 mg/dL    Comment: Glucose reference range applies only to samples taken after fasting for at least 8 hours.  Aerobic/Anaerobic Culture w Gram Stain (surgical/deep wound)     Status: None (Preliminary result)   Collection Time: 06/11/24  4:12 PM   Specimen: Abdomen; Abdominal Fluid  Result Value Ref Range   Specimen Description      ABDOMEN Performed at [facility], [address] [place], [place] [address]    Special Requests      ABDOMINAL FLUID Performed at [facility], [address], [place], [place] [address]    Gram Stain      ABUNDANT WBC PRESENT, PREDOMINANTLY PMN ABUNDANT GRAM NEGATIVE RODS FEW GRAM POSITIVE COCCI    Culture      MODERATE PSEUDOMONAS AERUGINOSA SUSCEPTIBILITIES TO FOLLOW Performed at [facility], [address] [address], [place], [place] [address]    Report Status PENDING   Prepare RBC (crossmatch)     Status: None   Collection Time: 06/11/24  4:25 PM  Result Value Ref Range   Order Confirmation      ORDER PROCESSED BY BLOOD BANK Performed at [facility], [address], [place], [place] [address]   Glucose, capillary     Status: None   Collection Time: 06/11/24  4:32 PM  Result Value Ref Range   Glucose-Capillary 87 70 - 99 mg/dL    Comment: Glucose reference range applies only to samples taken after fasting for at least 8 hours.  Hemoglobin and hematocrit, blood     Status: Abnormal   Collection Time: 06/11/24  5:56 PM  Result Value Ref Range   Hemoglobin 6.0 (LL) 12.0 - 15.0 g/dL    Comment: CRITICAL VALUE NOTED.  VALUE IS CONSISTENT WITH PREVIOUSLY REPORTED AND  CALLED VALUE. REPEATED TO VERIFY    HCT 19.7 (L) 36.0 - 46.0 %    Comment: Performed at [facility], [address], [place], [place] [address]  Glucose, capillary     Status: Abnormal   Collection Time: 06/11/24  8:38 PM  Result Value Ref Range   Glucose-Capillary 140 (H) 70 - 99 mg/dL    Comment: Glucose reference range applies only to samples taken after fasting for at least 8 hours.  Glucose, capillary  Status: Abnormal   Collection Time: 06/11/24 11:55 PM  Result Value Ref Range   Glucose-Capillary 152 (H) 70 - 99 mg/dL    Comment: Glucose reference range applies only to samples taken after fasting for at least 8 hours.  Glucose, capillary     Status: Abnormal   Collection Time: 06/12/24  3:50 AM  Result Value Ref Range   Glucose-Capillary 144 (H) 70 - 99 mg/dL    Comment: Glucose reference range applies only to samples taken after fasting for at least 8 hours.  Basic metabolic panel with GFR     Status: Abnormal   Collection Time: 06/12/24  4:33 AM  Result Value Ref Range   Sodium 136 135 - 145 mmol/L   Potassium 3.6 3.5 - 5.1 mmol/L   Chloride 108 98 - 111 mmol/L   CO2 21 (L) 22 - 32 mmol/L   Glucose, Bld 172 (H) 70 - 99 mg/dL    Comment: Glucose reference range applies only to samples taken after fasting for at least 8 hours.   BUN 35 (H) 8 - 23 mg/dL   Creatinine, Ser 9.25 0.44 - 1.00 mg/dL   Calcium  7.8 (L) 8.9 - 10.3 mg/dL   GFR, Estimated >39 >39 mL/min    Comment: (NOTE) Calculated using the CKD-EPI Creatinine Equation (2021)    Anion gap 7 5 - 15    Comment: Performed at [facility], [address], [place], [place] [address]  Magnesium      Status: None   Collection Time: 06/12/24  4:33 AM  Result Value Ref Range   Magnesium  2.0 1.7 - 2.4 mg/dL    Comment: Performed at [facility], [address] [place], [place] [address]  Phosphorus     Status: None   Collection Time: 06/12/24  4:33 AM  Result  Value Ref Range   Phosphorus 3.9 2.5 - 4.6 mg/dL    Comment: Performed at [facility], [address], [place], [place] [address]  CBC     Status: Abnormal   Collection Time: 06/12/24  4:33 AM  Result Value Ref Range   WBC 12.8 (H) 4.0 - 10.5 K/uL   RBC 2.40 (L) 3.87 - 5.11 MIL/uL   Hemoglobin 6.8 (LL) 12.0 - 15.0 g/dL    Comment: REPEATED TO VERIFY THIS CRITICAL RESULT HAS VERIFIED AND BEEN CALLED TO A. CHAVEZ, RN BY KATIE DAVIS ON 07 03 2025 AT 0502, AND HAS BEEN READ BACK.     HCT 21.7 (L) 36.0 - 46.0 %   MCV 90.4 80.0 - 100.0 fL   MCH 28.3 26.0 - 34.0 pg   MCHC 31.3 30.0 - 36.0 g/dL   RDW 81.3 (H) 88.4 - 84.4 %   Platelets 348 150 - 400 K/uL   nRBC 0.9 (H) 0.0 - 0.2 %    Comment: Performed at [facility], [address], [place], [place] [address]  Prepare RBC (crossmatch)     Status: None   Collection Time: 06/12/24  5:28 AM  Result Value Ref Range   Order Confirmation      ORDER PROCESSED BY BLOOD BANK Performed at [facility], [address] [place], [place] [address]   Glucose, capillary     Status: Abnormal   Collection Time: 06/12/24  7:32 AM  Result Value Ref Range   Glucose-Capillary 174 (H) 70 - 99 mg/dL    Comment: Glucose reference range applies only to samples taken after fasting for  at least 8 hours.  Glucose, capillary     Status: Abnormal   Collection Time: 06/12/24 12:47 PM  Result Value Ref Range   Glucose-Capillary 122 (H) 70 - 99 mg/dL    Comment: Glucose reference range applies only to samples taken after fasting for at least 8 hours.  Glucose, capillary     Status: Abnormal   Collection Time: 06/12/24  4:03 PM  Result Value Ref Range   Glucose-Capillary 125 (H) 70 - 99 mg/dL    Comment: Glucose reference range applies only to samples taken after fasting for at least 8 hours.  Hemoglobin and hematocrit, blood     Status: Abnormal   Collection Time: 06/12/24  6:51 PM  Result Value Ref Range    Hemoglobin 10.0 (L) 12.0 - 15.0 g/dL    Comment: REPEATED TO VERIFY POST TRANSFUSION SPECIMEN DELTA CHECK NOTED    HCT 31.0 (L) 36.0 - 46.0 %    Comment: Performed at [facility], [address] [place], [place] [address]  Glucose, capillary     Status: None   Collection Time: 06/12/24  8:52 PM  Result Value Ref Range   Glucose-Capillary 98 70 - 99 mg/dL    Comment: Glucose reference range applies only to samples taken after fasting for at least 8 hours.  Glucose, capillary     Status: Abnormal   Collection Time: 06/13/24  1:19 AM  Result Value Ref Range   Glucose-Capillary 102 (H) 70 - 99 mg/dL    Comment: Glucose reference range applies only to samples taken after fasting for at least 8 hours.  CBC     Status: Abnormal   Collection Time: 06/13/24  3:59 AM  Result Value Ref Range   WBC 9.5 4.0 - 10.5 K/uL   RBC 3.39 (L) 3.87 - 5.11 MIL/uL   Hemoglobin 9.6 (L) 12.0 - 15.0 g/dL   HCT 69.8 (L) 63.9 - 53.9 %   MCV 88.8 80.0 - 100.0 fL   MCH 28.3 26.0 - 34.0 pg   MCHC 31.9 30.0 - 36.0 g/dL   RDW 81.5 (H) 88.4 - 84.4 %   Platelets 335 150 - 400 K/uL   nRBC 0.4 (H) 0.0 - 0.2 %    Comment: Performed at [facility], [address] [place], [place] [address]  Glucose, capillary     Status: Abnormal   Collection Time: 06/13/24  4:09 AM  Result Value Ref Range   Glucose-Capillary 100 (H) 70 - 99 mg/dL    Comment: Glucose reference range applies only to samples taken after fasting for at least 8 hours.  Glucose, capillary     Status: Abnormal   Collection Time: 06/13/24  7:55 AM  Result Value Ref Range   Glucose-Capillary 108 (H) 70 - 99 mg/dL    Comment: Glucose reference range applies only to samples taken after fasting for at least 8 hours.    Imaging / Studies: CT GUIDED VISCERAL FLUID DRAIN BY PERC CATH Result Date: 06/12/2024 INDICATION: 83 year old with postoperative fluid collections. Plan for image guided aspiration or drainage.  EXAM: CT-guided aspiration and drain placement of abdominal fluid collections MEDICATIONS: Moderate sedation ANESTHESIA/SEDATION: Moderate (conscious) sedation was employed during this procedure. A total of Versed  2mg  and fentanyl  100 mcg was administered intravenously at the order of the provider performing the procedure. Total intra-service moderate sedation time: 60 minutes. Patient's level of consciousness and vital signs were monitored continuously by radiology nurse throughout the procedure under the supervision of  the provider performing the procedure. COMPLICATIONS: None immediate. PROCEDURE: Informed written consent was obtained from the patient after a thorough discussion of the procedural risks, benefits and alternatives. All questions were addressed. Maximal Sterile Barrier Technique was utilized including caps, mask, sterile gowns, sterile gloves, sterile drape, hand hygiene and skin antiseptic. A timeout was performed prior to the initiation of the procedure. Patient was placed supine on CT scanner. Images through the abdomen were obtained without contrast. Evidence for new fluid collections in the anterior abdomen. Therefore, contrast enhanced CT of the abdomen was performed with 75 mL Omnipaque  300. The anterior abdomen was prepped with Betadine. Sterile field was created. Skin was anesthetized using 1% lidocaine . Using CT guidance, the 18 gauge trocar needle was directed into the anterior abdominal cavity and red cloudy fluid was aspirated. Superstiff Amplatz wire was advanced into the collection and wire position was confirmed with CT guidance. The tract was dilated to accommodate a 12 [place] multipurpose drain. The drain was not aspirating well and follow up CT images demonstrated that the drain went beyond the fluid collection. The drain was pulled back into the fluid collection and approximately 50 mL of cloudy red fluid was aspirated. No additional fluid could be obtained from the drain and the  drain was not communicating with the other larger collections. Therefore, this drain was cut and completely removed. An area slightly more caudal to the drain site was prepped with chlorhexidine  and anesthetized with 1% lidocaine . 18 gauge needle was directed into this collection CT-guidance but no fluid could be aspirated. Ultrasound was prepped at this time. Ultrasound demonstrated that the intra-abdominal fluid collections are severely complex with echogenic material probably representing blood products. Based on ultrasound findings, the needle was removed. Bandages were placed. RADIATION DOSE REDUCTION: This exam was performed according to the departmental dose-optimization program which includes automated exposure control, adjustment of the mA and/or kV according to patient size and/or use of iterative reconstruction technique. FINDINGS: Initial CT images were compared with a CT on 06/10/2024. Compared to the previous CT, there is new complex fluid in the left anterior abdomen containing gas. Again noted is high-density material layering within the fluid collections suggestive for blood products. Contrast enhanced CT was performed in order to better characterize these complex fluid collections. 12 French drain did not track within the larger collections and probably related to the severe loculations. This drain was pulled back into the anterior abdomen and approximately 50 mL of fluid was aspirated from the anterior component. This drain was completely removed. No fluid could be aspirated from the second needle placement. IMPRESSION: 1. Increased air-fluid collections in the left anterior abdomen compared to the exam on 06/10/2024. These fluid collections are very complex with evidence of blood products. 2. CT-guided drain was placed in one of the anterior collections but only 50 mL of fluid could be obtained and the drain was not communicating with the other collections. Therefore, this drain was removed.  Fluid was sent for culture. Electronically Signed   By: Juliene Balder M.D.   On: 06/12/2024 08:57        Medications / Allergies: per chart  Antibiotics: Anti-infectives (From admission, onward)    Start     Dose/Rate Route Frequency Ordered Stop   06/11/24 1545  piperacillin -tazobactam (ZOSYN ) IVPB 3.375 g        3.375 g 12.5 mL/hr over 240 Minutes Intravenous Every 8 hours 06/11/24 1457     06/09/24 1745  DAPTOmycin  (CUBICIN ) IVPB 500 mg/50mL premix  500 mg 100 mL/hr over 30 Minutes Intravenous Daily 06/09/24 1647     06/06/24 1000  micafungin  (MYCAMINE ) 100 mg in sodium chloride  0.9 % 100 mL IVPB  Status:  Discontinued        100 mg 105 mL/hr over 1 Hours Intravenous Every 24 hours 06/05/24 1349 06/12/24 1142   06/04/24 1400  piperacillin -tazobactam (ZOSYN ) IVPB 3.375 g        3.375 g 12.5 mL/hr over 240 Minutes Intravenous Every 8 hours 06/04/24 0738 06/09/24 2124   06/04/24 1400  DAPTOmycin  (CUBICIN ) IVPB 500 mg/44mL premix        8 mg/kg  60.9 kg 100 mL/hr over 30 Minutes Intravenous Daily 06/04/24 0738 06/10/24 0126   06/03/24 1000  neomycin  (MYCIFRADIN ) tablet 500 mg  Status:  Discontinued        500 mg Oral 3 times daily 06/03/24 0935 06/03/24 1555   06/03/24 1000  metroNIDAZOLE  (FLAGYL ) tablet 500 mg  Status:  Discontinued       Note to Pharmacy: Take 2 pills (=1000mg ) by mouth at 1pm, 3pm, and 10pm the day before your colorectal operation   500 mg Oral 3 times daily 06/03/24 0935 06/03/24 1555   05/29/24 1500  DAPTOmycin  (CUBICIN ) IVPB 500 mg/50mL premix  Status:  Discontinued        8 mg/kg  60.9 kg 100 mL/hr over 30 Minutes Intravenous Daily 05/29/24 1359 06/04/24 0738   05/28/24 2100  vancomycin  (VANCOREADY) IVPB 750 mg/150 mL  Status:  Discontinued        750 mg 150 mL/hr over 60 Minutes Intravenous Every 12 hours 05/28/24 1419 05/29/24 1359   05/27/24 0800  vancomycin  (VANCOCIN ) IVPB 1000 mg/200 mL premix  Status:  Discontinued        1,000 mg 200 mL/hr  over 60 Minutes Intravenous Every 24 hours 05/26/24 1144 05/28/24 1419   05/23/24 1400  piperacillin -tazobactam (ZOSYN ) IVPB 3.375 g  Status:  Discontinued        3.375 g 12.5 mL/hr over 240 Minutes Intravenous Every 8 hours 05/23/24 0804 06/04/24 0738   05/23/24 1200  micafungin  (MYCAMINE ) 100 mg in sodium chloride  0.9 % 100 mL IVPB        100 mg 105 mL/hr over 1 Hours Intravenous Every 24 hours 05/23/24 0755 06/05/24 1221   05/22/24 0800  vancomycin  (VANCOCIN ) IVPB 1000 mg/200 mL premix  Status:  Discontinued        1,000 mg 200 mL/hr over 60 Minutes Intravenous Every 24 hours 05/21/24 0634 05/26/24 0806   05/21/24 0730  vancomycin  (VANCOREADY) IVPB 1250 mg/250 mL        1,250 mg 166.7 mL/hr over 90 Minutes Intravenous  Once 05/21/24 0634 05/21/24 1043   05/19/24 1400  piperacillin -tazobactam (ZOSYN ) IVPB 3.375 g  Status:  Discontinued        3.375 g 12.5 mL/hr over 240 Minutes Intravenous Every 8 hours 05/19/24 1303 05/23/24 0804   05/09/24 0900  erythromycin  250 mg in sodium chloride  0.9 % 100 mL IVPB        250 mg 100 mL/hr over 60 Minutes Intravenous Every 8 hours 05/09/24 0732 05/11/24 0044   05/08/24 2200  cefoTEtan  (CEFOTAN ) 2 g in sodium chloride  0.9 % 100 mL IVPB        2 g 200 mL/hr over 30 Minutes Intravenous Every 12 hours 05/08/24 1759 05/09/24 0749   05/08/24 1400  neomycin  (MYCIFRADIN ) tablet 1,000 mg  Status:  Discontinued       Placed in And  Linked Group   1,000 mg Oral 3 times per day 05/08/24 1119 05/08/24 1120   05/08/24 1400  metroNIDAZOLE  (FLAGYL ) tablet 1,000 mg  Status:  Discontinued       Placed in And Linked Group   1,000 mg Oral 3 times per day 05/08/24 1119 05/08/24 1120   05/08/24 1130  cefoTEtan  (CEFOTAN ) 2 g in sodium chloride  0.9 % 100 mL IVPB        2 g 200 mL/hr over 30 Minutes Intravenous On call to O.R. 05/08/24 1119 05/09/24 0749         Note: Portions of this report may have been transcribed using voice recognition software. Every  effort was made to ensure accuracy; however, inadvertent computerized transcription errors may be present.   Any transcriptional errors that result from this process are unintentional.    Elspeth KYM Schultze, MD, FACS, MASCRS Esophageal, Gastrointestinal & Colorectal Surgery Robotic and Minimally Invasive Surgery  [facility] [address], [address] [place], [place] [address] [phone] Fax [phone] Main  CONTACT INFORMATION: Weekday (9AM-5PM): Call [facility] main office at [phone] Weeknight (5PM-9AM) or Weekend/Holiday: Check EPIC Web Links tab & use AMION (password  [email or web address]) for General Surgery [facility] coverage  Please, DO NOT use SecureChat  (it is not reliable communication to reach operating surgeons & will lead to a delay in care).   Epic staff messaging available for outptient concerns needing 1-2 business day response.      06/13/2024  11:18 AM

## 2024-06-14 ENCOUNTER — Inpatient Hospital Stay (HOSPITAL_COMMUNITY)

## 2024-06-14 DIAGNOSIS — K5790 Diverticulosis of intestine, part unspecified, without perforation or abscess without bleeding: Secondary | ICD-10-CM | POA: Diagnosis not present

## 2024-06-14 DIAGNOSIS — I4819 Other persistent atrial fibrillation: Secondary | ICD-10-CM | POA: Diagnosis not present

## 2024-06-14 DIAGNOSIS — D62 Acute posthemorrhagic anemia: Secondary | ICD-10-CM | POA: Diagnosis not present

## 2024-06-14 LAB — COMPREHENSIVE METABOLIC PANEL WITH GFR
ALT: 18 U/L (ref 0–44)
AST: 25 U/L (ref 15–41)
Albumin: 1.8 g/dL — ABNORMAL LOW (ref 3.5–5.0)
Alkaline Phosphatase: 231 U/L — ABNORMAL HIGH (ref 38–126)
Anion gap: 9 (ref 5–15)
BUN: 21 mg/dL (ref 8–23)
CO2: 22 mmol/L (ref 22–32)
Calcium: 8.1 mg/dL — ABNORMAL LOW (ref 8.9–10.3)
Chloride: 103 mmol/L (ref 98–111)
Creatinine, Ser: 0.6 mg/dL (ref 0.44–1.00)
GFR, Estimated: >60 mL/min (ref >60)
Glucose, Bld: 107 mg/dL — ABNORMAL HIGH (ref 70–99)
Potassium: 3.9 mmol/L (ref 3.5–5.1)
Sodium: 134 mmol/L — ABNORMAL LOW (ref 135–145)
Total Bilirubin: 1.2 mg/dL (ref 0.0–1.2)
Total Protein: 6.3 g/dL — ABNORMAL LOW (ref 6.5–8.1)

## 2024-06-14 LAB — CBC
HCT: 31.5 % — ABNORMAL LOW (ref 36.0–46.0)
Hemoglobin: 9.7 g/dL — ABNORMAL LOW (ref 12.0–15.0)
MCH: 28 pg (ref 26.0–34.0)
MCHC: 30.8 g/dL (ref 30.0–36.0)
MCV: 90.8 fL (ref 80.0–100.0)
Platelets: 360 K/uL (ref 150–400)
RBC: 3.47 MIL/uL — ABNORMAL LOW (ref 3.87–5.11)
RDW: 19 % — ABNORMAL HIGH (ref 11.5–15.5)
WBC: 9.3 K/uL (ref 4.0–10.5)
nRBC: 0.2 % (ref 0.0–0.2)

## 2024-06-14 LAB — GLUCOSE, CAPILLARY
Glucose-Capillary: 103 mg/dL — ABNORMAL HIGH (ref 70–99)
Glucose-Capillary: 111 mg/dL — ABNORMAL HIGH (ref 70–99)
Glucose-Capillary: 128 mg/dL — ABNORMAL HIGH (ref 70–99)
Glucose-Capillary: 135 mg/dL — ABNORMAL HIGH (ref 70–99)
Glucose-Capillary: 142 mg/dL — ABNORMAL HIGH (ref 70–99)
Glucose-Capillary: 95 mg/dL (ref 70–99)

## 2024-06-14 LAB — MAGNESIUM: Magnesium: 2 mg/dL (ref 1.7–2.4)

## 2024-06-14 LAB — PHOSPHORUS: Phosphorus: 2.9 mg/dL (ref 2.5–4.6)

## 2024-06-14 MED ORDER — TRAVASOL 10 % IV SOLN
INTRAVENOUS | Status: AC
  Filled 2024-06-14: qty 940.8

## 2024-06-14 MED ORDER — OXYCODONE HCL 5 MG PO TABS
10.0000 mg | ORAL_TABLET | Freq: Every day | ORAL | Status: DC
  Administered 2024-06-14 – 2024-06-16 (×3): 10 mg
  Filled 2024-06-14 (×3): qty 2

## 2024-06-14 MED ORDER — LOPERAMIDE HCL 2 MG PO CAPS: 2.0000 mg | ORAL_CAPSULE | Freq: Four times a day (QID) | ORAL | Status: DC | PRN

## 2024-06-14 MED ORDER — FUROSEMIDE 20 MG PO TABS
20.0000 mg | ORAL_TABLET | Freq: Every day | ORAL | Status: AC
  Administered 2024-06-14 – 2024-06-16 (×3): 20 mg
  Filled 2024-06-14 (×3): qty 1

## 2024-06-14 MED ORDER — TRIAMCINOLONE ACETONIDE 0.1 % EX CREA
TOPICAL_CREAM | Freq: Two times a day (BID) | CUTANEOUS | Status: AC
  Filled 2024-06-14 (×2): qty 15

## 2024-06-14 MED ORDER — HYDROMORPHONE HCL 1 MG/ML IJ SOLN
0.5000 mg | INTRAMUSCULAR | Status: DC | PRN
  Administered 2024-06-14 – 2024-06-20 (×14): 1 mg via INTRAVENOUS
  Administered 2024-06-21: 0.5 mg via INTRAVENOUS
  Administered 2024-06-21 – 2024-06-23 (×8): 1 mg via INTRAVENOUS
  Filled 2024-06-14 (×24): qty 1

## 2024-06-14 MED ORDER — IOHEXOL 9 MG/ML PO SOLN
500.0000 mL | ORAL | Status: AC
  Administered 2024-06-14 (×2): 500 mL

## 2024-06-14 MED ORDER — HYDROXYZINE HCL 25 MG PO TABS
12.5000 mg | ORAL_TABLET | Freq: Four times a day (QID) | ORAL | Status: DC | PRN
  Administered 2024-06-14 – 2024-06-16 (×6): 25 mg via ORAL
  Filled 2024-06-14 (×7): qty 1

## 2024-06-14 MED ORDER — BISMUTH SUBSALICYLATE 262 MG/15ML PO SUSP
30.0000 mL | Freq: Two times a day (BID) | ORAL | Status: DC
  Administered 2024-06-14 (×2): 30 mL
  Filled 2024-06-14: qty 236

## 2024-06-14 MED ORDER — LOPERAMIDE HCL 2 MG PO CAPS: 4.0000 mg | ORAL_CAPSULE | Freq: Every day | ORAL | Status: DC

## 2024-06-14 MED ORDER — AMLODIPINE BESYLATE 10 MG PO TABS
5.0000 mg | ORAL_TABLET | Freq: Every day | ORAL | Status: DC
  Administered 2024-06-14 – 2024-06-17 (×4): 5 mg
  Filled 2024-06-14 (×4): qty 1

## 2024-06-14 MED ORDER — DIPHENOXYLATE-ATROPINE 2.5-0.025 MG PO TABS
2.0000 | ORAL_TABLET | Freq: Four times a day (QID) | ORAL | Status: DC
  Administered 2024-06-14 – 2024-06-17 (×11): 2
  Filled 2024-06-14 (×12): qty 2

## 2024-06-14 MED ORDER — HYDROCORTISONE 1 % EX CREA: TOPICAL_CREAM | Freq: Four times a day (QID) | CUTANEOUS | Status: DC | PRN

## 2024-06-14 MED ORDER — IOHEXOL 300 MG/ML  SOLN
100.0000 mL | Freq: Once | INTRAMUSCULAR | Status: AC | PRN
  Administered 2024-06-14: 100 mL via INTRAVENOUS

## 2024-06-14 MED ORDER — SODIUM CHLORIDE 0.9 % IV SOLN: INTRAVENOUS | Status: AC

## 2024-06-14 MED ORDER — VITAL HP 1.0 CAL PO LIQD
1000.0000 mL | ORAL | Status: DC
  Administered 2024-06-14: 1000 mL
  Filled 2024-06-14 (×2): qty 1000

## 2024-06-14 MED ORDER — HYDROCORTISONE 1 % EX CREA
TOPICAL_CREAM | Freq: Four times a day (QID) | CUTANEOUS | Status: DC
  Filled 2024-06-14: qty 28

## 2024-06-14 NOTE — Plan of Care (Signed)
  Problem: Nutritional: Goal: Maintenance of adequate nutrition will improve 06/14/2024 1643 by Kermit Handler, RN Outcome: Progressing 06/14/2024 1628 by Kermit Handler, RN Outcome: Progressing   Problem: Safety: Goal: Ability to remain free from injury will improve 06/14/2024 1643 by Kermit Handler, RN Outcome: Progressing 06/14/2024 1628 by Kermit Handler, RN Outcome: Progressing   Problem: Clinical Measurements: Goal: Cardiovascular complication will be avoided 06/14/2024 1643 by Kermit Handler, RN Outcome: Progressing 06/14/2024 1628 by Kermit Handler, RN Outcome: Progressing   Problem: Clinical Measurements: Goal: Respiratory complications will improve 06/14/2024 1643 by Kermit Handler, RN Outcome: Progressing 06/14/2024 1628 by Kermit Handler, RN Outcome: Progressing

## 2024-06-14 NOTE — Progress Notes (Addendum)
 06/14/2024  Elda CHRISTELLA Lecher [id number] 20-Feb-1941  CARE TEAM: PCP: Jesus Bernardino MATSU, MD  Outpatient Care Team: Patient Care Team: Jesus Bernardino MATSU, MD as PCP - General (Internal Medicine) [name], MD as Consulting Physician (Gastroenterology) Rubin Calamity, MD as Consulting Physician (General Surgery) [name], MD (Neurology) [name], MD as Consulting Physician (Urology) [name], MD as Consulting Physician (Cardiology) [name], MD as Consulting Physician (Colon and Rectal Surgery)  Inpatient Treatment Team: Treatment Team:  [name], MD Dennise Kingsley, MD [name], RN [name], MD Jennefer Ester PARAS, MD Allred, Harman POUR, PA-C Bobbette Clause, MD [name], A, OT [name], MD Saralee Yancy RAMAN, NT Willodean Estela LABOR, RN Kermit Handler, RN Koleen Devere SAUNDERS, NT Erlenmeyer, Bernardino CHRISTELLA, RN   Problem List:   Principal Problem:   Diverticular disease Active Problems:   A-fib [facility])   Restless leg   ABLA (acute blood loss anemia)   Need for emotional support   Acute urinary retention   Mixed hyperlipidemia   Essential hypertension   Diverticular disease of left colon   History of DVT (deep vein thrombosis)   History of pulmonary embolus (PE)   Pre-diabetes   Presence of IVC filter   Stricture of sigmoid colon (HCC)   Protein-calorie malnutrition, severe   Hypokalemia   S/P percutaneous endoscopic gastrostomy (PEG) tube placement [facility])   Goals of care, counseling/discussion   Shock, during or resulting from a surgical procedure   05/08/2024  POST-OPERATIVE DIAGNOSIS:   COLOSTOMY FOR RESECTION, DESIRE FOR OSTOMY TAKEDOWN RECTAL STRICTURE HISTORY OF DIVERTICULITIS WITH PERFORATION & ABSCESS   PROCEDURE:   -ROBOTIC RECTOSIGMOID RESECTION (LAR) -TAKEDOWN OF END COLOSTOMY WITH ANASTOMOSIS -RESECTION OF SMALL INTESTINE WITH ANASTOMOSIS -SMALL BOWEL REPAIR -LYSIS OF ADHESIONS x 115 MINUTES (66% OF CASE),   -INTRAOPERATIVE ASSESSMENT OF TISSUE VASCULAR PERFUSION USING ICG (indocyanine green ) IMMUNOFLUORESCENCE,  -TRANSVERSUS ABDOMINIS PLANE (TAP) BLOCK - BILATERAL -FLEXIBLE SIGMOIDOSCOPY   SURGEON:  [name], MD   05/20/2024  Post procedural Dx: Post op abscess, multiple   Technically successful CT guided placed of a  10 Fr drainage catheter placement x 2  into the midline ventral and dorsal perirectal abscesses.     [name] LABOR, MD    05/28/2024  IMPRESSION: Satisfactory removal of the midline anterior abscess drainage catheter and placement of a new midline anterior pelvic drainage catheter as well as a posterior transgluteal drainage catheter in pelvic abscesses.     Electronically Signed   By: [name]    06/03/2024  POST-OPERATIVE DIAGNOSIS:   DELAYED COLORECTAL ANASTOMOTIC LEAK, NEED FOR FECAL DIVERSION CHRONIC PELVIC ABSCESS   PROCEDURE:   DIAGNOSTIC LAPAROSCOPY CONVERTED TO LAPAROTOMY DIVERTING LOOP TRANSVERSE COLOSTOMY DRAINAGE OF ABDOMINAL ABSCESS GASTROSTOMY TUBE PLACEMENT RECTAL TUBE PLACEMENT   SURGEON:  [name], MD   OR FINDINGS: Very dense concrete intraperitoneal and pelvic adhesions infraumbilically.  Bladder, small bowel, omentum walling off the lower pelvis with delayed anastomotic leak.   Near disruption of colorectal anastomosis by transanal examination.  Rectal tube placed through rectal stump to drain the pelvis more directly.   Diverting mid-transverse loop colostomy done.   Removal of old percutaneous abdominal and transgluteal drains.     Assessment [facility] Stay = 37 days) 11 Days Post-Op    Failure to thrive with delayed anastomotic leak     Plan:  Delayed anastomotic leak   percutaneous drainage with some improvement and then decline after drain fell out  and no improvement after replacement drainage.  Diverting loop transverse colostomy (lower abdomen extremely fixed).  6/25 with very fixed  abdomen.  She had been tolerating tube feeds at goal but now having diarrhea out anus.  Do not know if she is having diarrhea across the diverting loop vs she has any new fistula down to the pelvis/rectum.  Retry trophic TFs  Because of persistent diarrhea and leaking, okay with doing rectal tube for now.  She has pressure and discomfort but is glad not to be sitting in diarrhea.  WTD packing incision since superficial  CT scan w eneteral contrast via Gtube (wont drink contrast) to r/o new SB fistula to pelvis/rectum.  Try to avoid colostomy revision  Severe malnutrition   Back on TPN for now.  Try trophic tube feeds and see if she tolerates that    Infection: -Zosyn  only given Pseudomonas the only thing growing on last 2 cultures -per ID Would be helpful to get sensitivities to see if she is growing a more drug-resistant organism.   Surgical drain removed since low output.  Left upper quadrant fluid collections consistent with postop hematoma is growing Pseudomonas.  Drain not left in place.  May need IR to reattempt if not better on CT - holf off for now given bleeding w last drain placement  Midline wound superficial enough to do just packing for now  Patient needs repeat CAT scan - held off 7/4 but try today   C/o Leg itching/weakness.  No evid of rash nor weakness.  Not better onIV/topical benadryl ,  OK to try topical steroids.  Defer to need for Duplex w TRH/medicine.  Pt w IVC filter already & significant probs w anticoagulation.    Some recurrent atrial fibrillation and rapid rate.    PO/Gtube metoprolol .  Add back amlodipine  since starting to get hypertensive again.  See if medicine agrees.  I think medicine placed back on telemetry.  Do not feel it is needed   Will defer back to them.    Acute blood loss anemia in the setting of anemia of chronic disease.    Transfused 6/26, 7/2.  Hemoglobin improved from 10-9.6 this morning.  Follow.    Suspect she has rectus  sheath hematoma bleeding on heparin .  That is held.  Hemoglobin more stable currently reassuring.  I think she has failed anticoagulation again.  I would be very hesitant to restart heparin  ever again given her numerous severe bleeding episodes on that despite her history of PE/DVT (has IVC filter as a result of prior bleeding problems) and chronic A-fib.  No easy solutions here  No evidence of coagulopathy or thrombocytopenia.  Of note, she had a prior workup and follow-up that did not show any coagulopathy by Dr. Lanny with hematology.  May reconsult if persistent oozing/bleeding  IV iron .    Enteral iron  with vitamin C  6/30.  I would not trust enteral route to be totally adequate but may help  History of urinary retention.  Will keep Foley catheter for now.  May consider removing with intermittent I&O cath or Flexi-Seal if needed.  Get CAT scan and diuresis first.  See if medicine agrees.    -monitor electrolytes & replace as needed.  Keep K>4, Mg>2, Phos>3.   -Diuresis as tolerated.  Trying to get down to her presurgical weight (~62kg)  if possible.  Follow-up with anemia needing transfusion.  -Pain control.  Scheduled Tylenol .  More alert so I think we can increase her oxycodone  dose  since she struggles with pain.  10 mg at night to help her sleep.  control  VTE prophylaxis.  Patient had severe bleeding issues on anticoagulation numerous hospitalizations.  Would avoid.  Trying to SCDs.  Already has IVC filter in place for prior PE/DVTs.  Patient has some leg swelling -standard of care is considering duplex of lower extremities but will defer to medicine.  Very guarded on doing anticoagulation on her yet again numerous bleeding complications she has had  -mobilize as tolerated to help recovery.  Try and get therapists involved to help her mobilize more.  Biggest challenge.    I updated the patient's status to the patient and nurse.  Called discussed with the patient's granddaughter who  closely follows her 170/1.  Recommendations were made.  Questions were answered.  They expressed understanding & appreciation.  -Disposition: TBD.   There is no way she can go straight home.  She will need skilled nursing facility and rehab in the past.    Agree with palliative care.  I do not think we have a final point of futility but options are limited - neither I nor granddaughter desire prolonged suffering either.    See if we can turn things around in this coming week.     I reviewed last 24 h vitals and pain scores, last 48 h intake and output, last 24 h labs and trends, and last 24 h imaging results.  I have reviewed this patient's available data, including medical history, events of note, test results, etc as part of my evaluation.   A significant portion of that time was spent in counseling. Care during the described time interval was provided by me.  This care required moderate level of medical decision making.  06/14/2024    Subjective: (Chief complaint)  Patient more alert.  Number of less abdominal pain.  Complaining of right lower extremity itching and numbness and back itching.  No relief with Benadryl  IV or topical.  Thirsty and drinking plenty of liquids coming out gastrostomy tube.  Diarrhea seems to have calmed down.  Patient wondering if she can eat.  Nursing just outside room    Objective:  Vital signs:  Vitals:   06/13/24 1206 06/13/24 2042 06/14/24 0413 06/14/24 0500  BP: 120/70 134/88 (!) 148/88   Pulse: 95 92    Resp: 16 20 18    Temp: 97.8 F (36.6 C) 98.4 F (36.9 C) 97.8 F (36.6 C)   TempSrc: Oral Oral Oral   SpO2: 100% 100% 100%   Weight:    69.6 kg  Height:        Last BM Date : 06/13/24  Intake/Output   Yesterday:  07/04 0701 - 07/05 0700 In: 2410.2 [I.V.:2179.6; IV Piggyback:230.6] Out: 2210 [Urine:1000; Drains:1010; Stool:200] This shift:  No intake/output data recorded.  Patient claims to have drank 3-4 full cups of  water  - not recorded  Bowel function:  Flatus: YES  BM:  YES   Drain:  Gastrostomy tube with thin bilious effluent and gravity bag Foley catheter with clear yellow urine Rectal tube with thin green bilious effluent   Physical Exam:  General: Awake.  Sitting up.  More alert.             Eyes: PERRL, normal EOM.  Sclera clear.  No icterus Neuro: CN II-XII intact w/o focal sensory/motor deficits.  No facial droop. Lymph: No head/neck/groin lymphadenopathy Psych:  No delerium/psychosis/paranoia.  Oriented x 4 HENT: Normocephalic, Mucus membranes moist.  No thrush.  Mild HOH Neck: Supple, No tracheal deviation.  No obvious thyromegaly.  Chest: No pain to chest wall compression.  Good respiratory excursion.  Mild audible wheezing CV:  Pulses intact.  Regular rhythm.  No major extremity edema MS: Normal AROM mjr joints.  No obvious deformity  Abdomen:  Most of abdomen soft but left upper quadrant with some firmness consistent with rectus sheath abdominal wall hematoma.  Mildy distended.  Mildly tender at incisions only.  No guarding.   Right upper quadrant mid transverse diverting loop colostomy pink with edema.  No gas nor stool Midline incision with some granulation. superificial Gastrostomy tube left upper quadrant without any leaking   GU: Foley in place with light yellow clear urine  Rectal: Flexi-Seal tube  - thin brown effluent Ext:   No deformity.  No mjr edema.  No cyanosis Skin: No petechiae / purpurea.  No major sores.  Warm and dry    Results:   Cultures: Recent Results (from the past 720 hours)  MIC (1 Drug)-Abdominal drain abscess; 05/22/2024; Abdomen; MRSA; Daptomycin      Status: Abnormal   Collection Time: 05/22/24  1:54 PM   Specimen: Abdomen  Result Value Ref Range Status   Min Inhibitory Conc (1 Drug) Final report (A)  Corrected    Comment: (NOTE) Performed At: [facility] [address] [place], [place] [id number] Jennette Shorter MD  Ey:[id number] CORRECTED ON 06/24 AT 0836: PREVIOUSLY REPORTED AS Preliminary report    Source ABSCESS  Final    Comment: Performed at [facility], [address] [place], [place] [address]  MIC Result     Status: Abnormal   Collection Time: 05/22/24  1:54 PM  Result Value Ref Range Status   Result 1 (MIC) Comment (A)  Final    Comment: (NOTE) Methicillin - resistant Staphylococcus aureus Identification performed by account, not confirmed by this laboratory. DAPTOMYCIN    <=1 UG/ML = SUSCEPTIBLE Performed At: [facility] [address] [place], [place] [id number] Jennette Shorter MD Ey:[id number]   Aerobic/Anaerobic Culture w Gram Stain (surgical/deep wound)     Status: None   Collection Time: 05/22/24  3:59 PM   Specimen: Abdomen  Result Value Ref Range Status   Specimen Description   Final    ABDOMEN Performed at [facility], [address], [place], [place] [address]    Special Requests   Final    ABDOMINAL DRAIN Performed at [facility], [address] [place], [place] [address]    Gram Stain   Final    ABUNDANT WBC PRESENT, PREDOMINANTLY PMN RARE GRAM POSITIVE COCCI RARE YEAST WITH PSEUDOHYPHAE    Culture   Final    MODERATE PSEUDOMONAS AERUGINOSA MODERATE STAPHYLOCOCCUS AUREUS SUSCEPTIBILITIES PERFORMED ON PREVIOUS CULTURE WITHIN THE LAST 5 DAYS. MODERATE CANDIDA ALBICANS SEE SEPARATE REPORT NO ANAEROBES ISOLATED Performed at [facility], [address] [place], [place] [address]    Report Status 06/04/2024 FINAL  Final   Organism ID, Bacteria PSEUDOMONAS AERUGINOSA  Final      Susceptibility   Pseudomonas aeruginosa - MIC*    CEFTAZIDIME 4 SENSITIVE Sensitive     CIPROFLOXACIN <=0.25 SENSITIVE Sensitive     GENTAMICIN 4 SENSITIVE Sensitive     IMIPENEM 2 SENSITIVE Sensitive     PIP/TAZO <=4 SENSITIVE Sensitive ug/mL    CEFEPIME 2 SENSITIVE Sensitive     * MODERATE PSEUDOMONAS AERUGINOSA   Aerobic/Anaerobic Culture w Gram Stain (surgical/deep wound)     Status:  None   Collection Time: 05/22/24  3:59 PM   Specimen: Back  Result Value Ref Range Status   Specimen Description   Final    BACK Performed at [facility], [address] [place], [place] [address]    Special Requests   Final    TRANSGLUTEAL PERCUTANEOUS IR DRAIN Performed at [facility], [address], [place], [place] [address]    Gram Stain   Final    NO WBC SEEN ABUNDANT GRAM NEGATIVE RODS RARE GRAM POSITIVE COCCI IN PAIRS RARE BUDDING YEAST SEEN    Culture   Final    ABUNDANT PSEUDOMONAS AERUGINOSA ABUNDANT METHICILLIN RESISTANT STAPHYLOCOCCUS AUREUS ABUNDANT ENTEROCOCCUS FAECALIS ABUNDANT CANDIDA ALBICANS NO ANAEROBES ISOLATED Sent to [facility] for further susceptibility testing. Performed at [facility], [address], [place], [place] [address]    Report Status 05/27/2024 FINAL  Final   Organism ID, Bacteria PSEUDOMONAS AERUGINOSA  Final   Organism ID, Bacteria METHICILLIN RESISTANT STAPHYLOCOCCUS AUREUS  Final   Organism ID, Bacteria ENTEROCOCCUS FAECALIS  Final      Susceptibility   Enterococcus faecalis - MIC*    AMPICILLIN <=2 SENSITIVE Sensitive     VANCOMYCIN  1 SENSITIVE Sensitive     GENTAMICIN SYNERGY SENSITIVE Sensitive     * ABUNDANT ENTEROCOCCUS FAECALIS   Methicillin resistant staphylococcus aureus - MIC*    CIPROFLOXACIN >=8 RESISTANT Resistant     ERYTHROMYCIN  >=8 RESISTANT Resistant     GENTAMICIN <=0.5 SENSITIVE Sensitive     OXACILLIN >=4 RESISTANT Resistant     TETRACYCLINE <=1 SENSITIVE Sensitive     VANCOMYCIN  1 SENSITIVE Sensitive     TRIMETH/SULFA >=320 RESISTANT Resistant     CLINDAMYCIN <=0.25 SENSITIVE Sensitive     RIFAMPIN <=0.5 SENSITIVE Sensitive     Inducible Clindamycin NEGATIVE Sensitive     LINEZOLID 2 SENSITIVE Sensitive     * ABUNDANT METHICILLIN RESISTANT STAPHYLOCOCCUS AUREUS   Pseudomonas aeruginosa -  MIC*    CEFTAZIDIME 4 SENSITIVE Sensitive     CIPROFLOXACIN <=0.25 SENSITIVE Sensitive     GENTAMICIN 4 SENSITIVE Sensitive     IMIPENEM 2 SENSITIVE Sensitive     PIP/TAZO <=4 SENSITIVE Sensitive ug/mL    CEFEPIME 2 SENSITIVE Sensitive     * ABUNDANT PSEUDOMONAS AERUGINOSA  Yeast Susceptibilities     Status: None   Collection Time: 05/22/24  3:59 PM  Result Value Ref Range Status   SOURCE CANDIDA ALBICAN ABD ABSC  Final    Comment: Performed at [facility], [address] [place], [place] [address]   Organism ID, Yeast Candida albicans  Final    Comment: (NOTE) Identification performed by account, not confirmed by this laboratory.    Amphotericin B MIC 0.25 ug/mL  Final    Comment: (NOTE) Breakpoints have been established for only some organism-drug combinations as indicated. This test was developed and its performance characteristics determined by [facility]. It has not been cleared or approved by the Food and Drug Administration.    Anidulafungin MIC Comment  Final    Comment: (NOTE) 0.03 ug/mL Susceptible Breakpoints have been established for only some organism-drug combinations as indicated. This test was developed and its performance characteristics determined by [facility]. It has not been cleared or approved by the Food and Drug Administration.    Caspofungin MIC Comment  Final    Comment: (NOTE) 0.12 ug/mL Susceptible Breakpoints have been established for only some organism-drug combinations as indicated. This test was developed and its  performance characteristics determined by [facility]. It has not been cleared or approved by the Food and Drug Administration.    Fluconazole  Islt MIC 1.0 ug/mL Susceptible  Final    Comment: (NOTE) Breakpoints have been established for only some organism-drug combinations as indicated. This test was developed and its performance characteristics determined by [facility]. It has not been cleared or approved by the Food and Drug  Administration.    ISAVUCONAZOLE MIC 0.008 ug/mL or less  Final    Comment: (NOTE) This test was developed and its performance characteristics determined by [facility]. It has not been cleared or approved by the Food and Drug Administration.    Itraconazole MIC 0.06 ug/mL  Final    Comment: (NOTE) Breakpoints have been established for only some organism-drug combinations as indicated. This test was developed and its performance characteristics determined by [facility]. It has not been cleared or approved by the Food and Drug Administration.    Micafungin  MIC Comment  Final    Comment: (NOTE) 0.008 ug/mL or less, Susceptible Breakpoints have been established for only some organism-drug combinations as indicated. This test was developed and its performance characteristics determined by [facility]. It has not been cleared or approved by the Food and Drug Administration.    Posaconazole MIC 0.06 ug/mL  Final    Comment: (NOTE) Breakpoints have been established for only some organism-drug combinations as indicated. This test was developed and its performance characteristics determined by [facility]. It has not been cleared or approved by the Food and Drug Administration.    REZAFUNGIN MIC Comment  Final    Comment: (NOTE) 0.15 ug/mL Susceptible This test was developed and its performance characteristics determined by [facility]. It has not been cleared or approved by the Food and Drug Administration.    Voriconazole MIC Comment  Final    Comment: (NOTE) 0.15 ug/mL Susceptible Breakpoints have been established for only some organism-drug combinations as indicated. This test was developed and its performance characteristics determined by [facility]. It has not been cleared or approved by the Food and Drug Administration. Performed At: [facility] [address] [place], [place] [id number] Jennette Shorter MD Ey:[id number]   Aerobic/Anaerobic Culture w Gram Stain (surgical/deep  wound)     Status: None   Collection Time: 05/27/24  5:19 PM   Specimen: Abscess  Result Value Ref Range Status   Specimen Description   Final    ABSCESS ABDOMEN Performed at [facility], [address], [place], [place] [address]    Special Requests   Final    NONE Performed at [facility], [address], [place], [place] [address]    Gram Stain   Final    ABUNDANT WBC PRESENT, PREDOMINANTLY PMN NO ORGANISMS SEEN    Culture   Final    RARE PSEUDOMONAS AERUGINOSA RARE CANDIDA ALBICANS NO ANAEROBES ISOLATED Performed at [facility], [address] [place], [place] [address]    Report Status 06/01/2024 FINAL  Final   Organism ID, Bacteria PSEUDOMONAS AERUGINOSA  Final      Susceptibility   Pseudomonas aeruginosa - MIC*    CEFTAZIDIME 4 SENSITIVE Sensitive     CIPROFLOXACIN <=0.25 SENSITIVE Sensitive     GENTAMICIN 4 SENSITIVE Sensitive     IMIPENEM 2 SENSITIVE Sensitive     PIP/TAZO <=4 SENSITIVE Sensitive ug/mL    CEFEPIME 2 SENSITIVE Sensitive     * RARE PSEUDOMONAS AERUGINOSA  Surgical pcr screen     Status: Abnormal   Collection  Time: 06/02/24  7:55 PM   Specimen: Nasal Mucosa; Nasal Swab  Result Value Ref Range Status   MRSA, PCR POSITIVE (A) NEGATIVE Final   Staphylococcus aureus POSITIVE (A) NEGATIVE Final    Comment: (NOTE) The Xpert SA Assay (FDA approved for NASAL specimens in patients 71 years of age and older), is one component of a comprehensive surveillance program. It is not intended to diagnose infection nor to guide or monitor treatment. Performed at [facility], [address], [place], [place] [address]   Aerobic/Anaerobic Culture w Gram Stain (surgical/deep wound)     Status: None (Preliminary result)   Collection Time: 06/11/24  4:12 PM   Specimen: Abdomen; Abdominal Fluid  Result Value Ref Range Status   Specimen Description   Final    ABDOMEN Performed at [facility], [address], [place], [place] [address]    Special Requests   Final    ABDOMINAL FLUID Performed at [facility], [address] [place], [place] [address]    Gram Stain   Final    ABUNDANT WBC PRESENT, PREDOMINANTLY PMN ABUNDANT GRAM NEGATIVE RODS FEW GRAM POSITIVE COCCI    Culture   Final    MODERATE PSEUDOMONAS AERUGINOSA SUSCEPTIBILITIES TO FOLLOW HOLDING FOR POSSIBLE ANAEROBE Performed at [facility], [address], [place], [place] [address]    Report Status PENDING  Incomplete    Labs: Results for orders placed or performed during the hospital encounter of 05/08/24 (from the past 48 hours)  Glucose, capillary     Status: Abnormal   Collection Time: 06/12/24  7:32 AM  Result Value Ref Range   Glucose-Capillary 174 (H) 70 - 99 mg/dL    Comment: Glucose reference range applies only to samples taken after fasting for at least 8 hours.  Glucose, capillary     Status: Abnormal   Collection Time: 06/12/24 12:47 PM  Result Value Ref Range   Glucose-Capillary 122 (H) 70 - 99 mg/dL    Comment: Glucose reference range applies only to samples taken after fasting for at least 8 hours.  Glucose, capillary     Status: Abnormal   Collection Time: 06/12/24  4:03 PM  Result Value Ref Range   Glucose-Capillary 125 (H) 70 - 99 mg/dL    Comment: Glucose reference range applies only to samples taken after fasting for at least 8 hours.  Hemoglobin and hematocrit, blood     Status: Abnormal   Collection Time: 06/12/24  6:51 PM  Result Value Ref Range   Hemoglobin 10.0 (L) 12.0 - 15.0 g/dL    Comment: REPEATED TO VERIFY POST TRANSFUSION SPECIMEN DELTA CHECK NOTED    HCT 31.0 (L) 36.0 - 46.0 %    Comment: Performed at [facility], [address] [place], [place] [address]  Glucose, capillary     Status: None   Collection Time: 06/12/24  8:52 PM  Result Value Ref Range   Glucose-Capillary 98 70 - 99 mg/dL     Comment: Glucose reference range applies only to samples taken after fasting for at least 8 hours.  Glucose, capillary     Status: Abnormal   Collection Time: 06/13/24  1:19 AM  Result Value Ref Range   Glucose-Capillary 102 (H) 70 - 99 mg/dL    Comment: Glucose reference range applies only to samples taken after fasting for at least 8 hours.  CBC     Status: Abnormal   Collection Time: 06/13/24  3:59 AM  Result Value Ref Range   WBC 9.5 4.0 - 10.5 K/uL   RBC 3.39 (L) 3.87 - 5.11 MIL/uL   Hemoglobin 9.6 (L) 12.0 - 15.0 g/dL   HCT 69.8 (L) 63.9 - 53.9 %   MCV 88.8 80.0 - 100.0 fL   MCH 28.3 26.0 - 34.0 pg   MCHC 31.9 30.0 - 36.0 g/dL   RDW 81.5 (H) 88.4 - 84.4 %   Platelets 335 150 - 400 K/uL   nRBC 0.4 (H) 0.0 - 0.2 %    Comment: Performed at [facility], [address] [place], [place] [address]  Glucose, capillary     Status: Abnormal   Collection Time: 06/13/24  4:09 AM  Result Value Ref Range   Glucose-Capillary 100 (H) 70 - 99 mg/dL    Comment: Glucose reference range applies only to samples taken after fasting for at least 8 hours.  Glucose, capillary     Status: Abnormal   Collection Time: 06/13/24  7:55 AM  Result Value Ref Range   Glucose-Capillary 108 (H) 70 - 99 mg/dL    Comment: Glucose reference range applies only to samples taken after fasting for at least 8 hours.  Glucose, capillary     Status: Abnormal   Collection Time: 06/13/24 12:09 PM  Result Value Ref Range   Glucose-Capillary 113 (H) 70 - 99 mg/dL    Comment: Glucose reference range applies only to samples taken after fasting for at least 8 hours.  Glucose, capillary     Status: Abnormal   Collection Time: 06/13/24  4:12 PM  Result Value Ref Range   Glucose-Capillary 100 (H) 70 - 99 mg/dL    Comment: Glucose reference range applies only to samples taken after fasting for at least 8 hours.  Glucose, capillary     Status: None   Collection Time: 06/13/24  8:39 PM  Result Value Ref  Range   Glucose-Capillary 89 70 - 99 mg/dL    Comment: Glucose reference range applies only to samples taken after fasting for at least 8 hours.  Glucose, capillary     Status: None   Collection Time: 06/14/24 12:23 AM  Result Value Ref Range   Glucose-Capillary 95 70 - 99 mg/dL    Comment: Glucose reference range applies only to samples taken after fasting for at least 8 hours.  CBC     Status: Abnormal   Collection Time: 06/14/24  2:33 AM  Result Value Ref Range   WBC 9.3 4.0 - 10.5 K/uL   RBC 3.47 (L) 3.87 - 5.11 MIL/uL   Hemoglobin 9.7 (L) 12.0 - 15.0 g/dL   HCT 68.4 (L) 63.9 - 53.9 %   MCV 90.8 80.0 - 100.0 fL   MCH 28.0 26.0 - 34.0 pg   MCHC 30.8 30.0 - 36.0 g/dL   RDW 80.9 (H) 88.4 - 84.4 %   Platelets 360 150 - 400 K/uL   nRBC 0.2 0.0 - 0.2 %    Comment: Performed at [facility], [address], [place], [place] [address]  Comprehensive metabolic panel     Status: Abnormal   Collection Time: 06/14/24  2:33 AM  Result Value Ref Range   Sodium 134 (L) 135 - 145 mmol/L   Potassium 3.9 3.5 - 5.1 mmol/L   Chloride 103 98 - 111 mmol/L   CO2 22 22 - 32 mmol/L   Glucose, Bld 107 (H) 70 - 99 mg/dL    Comment: Glucose reference  range applies only to samples taken after fasting for at least 8 hours.   BUN 21 8 - 23 mg/dL   Creatinine, Ser 9.39 0.44 - 1.00 mg/dL   Calcium  8.1 (L) 8.9 - 10.3 mg/dL   Total Protein 6.3 (L) 6.5 - 8.1 g/dL   Albumin  1.8 (L) 3.5 - 5.0 g/dL   AST 25 15 - 41 U/L   ALT 18 0 - 44 U/L   Alkaline Phosphatase 231 (H) 38 - 126 U/L   Total Bilirubin 1.2 0.0 - 1.2 mg/dL   GFR, Estimated >39 >39 mL/min    Comment: (NOTE) Calculated using the CKD-EPI Creatinine Equation (2021)    Anion gap 9 5 - 15    Comment: Performed at [facility], [address], [place], [place] [address]  Magnesium      Status: None   Collection Time: 06/14/24  2:33 AM  Result Value Ref Range   Magnesium  2.0 1.7 - 2.4 mg/dL    Comment:  Performed at [facility], [address] [place], [place] [address]  Phosphorus     Status: None   Collection Time: 06/14/24  2:33 AM  Result Value Ref Range   Phosphorus 2.9 2.5 - 4.6 mg/dL    Comment: Performed at [facility], [address], [place], [place] [address]  Glucose, capillary     Status: Abnormal   Collection Time: 06/14/24  4:10 AM  Result Value Ref Range   Glucose-Capillary 103 (H) 70 - 99 mg/dL    Comment: Glucose reference range applies only to samples taken after fasting for at least 8 hours.    Imaging / Studies: No results found.       Medications / Allergies: per chart  Antibiotics: Anti-infectives (From admission, onward)    Start     Dose/Rate Route Frequency Ordered Stop   06/11/24 1545  piperacillin -tazobactam (ZOSYN ) IVPB 3.375 g        3.375 g 12.5 mL/hr over 240 Minutes Intravenous Every 8 hours 06/11/24 1457     06/09/24 1745  DAPTOmycin  (CUBICIN ) IVPB 500 mg/50mL premix  Status:  Discontinued        500 mg 100 mL/hr over 30 Minutes Intravenous Daily 06/09/24 1647 06/13/24 1539   06/06/24 1000  micafungin  (MYCAMINE ) 100 mg in sodium chloride  0.9 % 100 mL IVPB  Status:  Discontinued        100 mg 105 mL/hr over 1 Hours Intravenous Every 24 hours 06/05/24 1349 06/12/24 1142   06/04/24 1400  piperacillin -tazobactam (ZOSYN ) IVPB 3.375 g        3.375 g 12.5 mL/hr over 240 Minutes Intravenous Every 8 hours 06/04/24 0738 06/09/24 2124   06/04/24 1400  DAPTOmycin  (CUBICIN ) IVPB 500 mg/83mL premix        8 mg/kg  60.9 kg 100 mL/hr over 30 Minutes Intravenous Daily 06/04/24 0738 06/10/24 0126   06/03/24 1000  neomycin  (MYCIFRADIN ) tablet 500 mg  Status:  Discontinued        500 mg Oral 3 times daily 06/03/24 0935 06/03/24 1555   06/03/24 1000  metroNIDAZOLE  (FLAGYL ) tablet 500 mg  Status:  Discontinued       Note to Pharmacy: Take 2 pills (=1000mg ) by mouth at 1pm, 3pm, and 10pm the day before your colorectal  operation   500 mg Oral 3 times daily 06/03/24 0935 06/03/24 1555   05/29/24 1500  DAPTOmycin  (CUBICIN ) IVPB 500 mg/50mL premix  Status:  Discontinued  8 mg/kg  60.9 kg 100 mL/hr over 30 Minutes Intravenous Daily 05/29/24 1359 06/04/24 0738   05/28/24 2100  vancomycin  (VANCOREADY) IVPB 750 mg/150 mL  Status:  Discontinued        750 mg 150 mL/hr over 60 Minutes Intravenous Every 12 hours 05/28/24 1419 05/29/24 1359   05/27/24 0800  vancomycin  (VANCOCIN ) IVPB 1000 mg/200 mL premix  Status:  Discontinued        1,000 mg 200 mL/hr over 60 Minutes Intravenous Every 24 hours 05/26/24 1144 05/28/24 1419   05/23/24 1400  piperacillin -tazobactam (ZOSYN ) IVPB 3.375 g  Status:  Discontinued        3.375 g 12.5 mL/hr over 240 Minutes Intravenous Every 8 hours 05/23/24 0804 06/04/24 0738   05/23/24 1200  micafungin  (MYCAMINE ) 100 mg in sodium chloride  0.9 % 100 mL IVPB        100 mg 105 mL/hr over 1 Hours Intravenous Every 24 hours 05/23/24 0755 06/05/24 1221   05/22/24 0800  vancomycin  (VANCOCIN ) IVPB 1000 mg/200 mL premix  Status:  Discontinued        1,000 mg 200 mL/hr over 60 Minutes Intravenous Every 24 hours 05/21/24 0634 05/26/24 0806   05/21/24 0730  vancomycin  (VANCOREADY) IVPB 1250 mg/250 mL        1,250 mg 166.7 mL/hr over 90 Minutes Intravenous  Once 05/21/24 0634 05/21/24 1043   05/19/24 1400  piperacillin -tazobactam (ZOSYN ) IVPB 3.375 g  Status:  Discontinued        3.375 g 12.5 mL/hr over 240 Minutes Intravenous Every 8 hours 05/19/24 1303 05/23/24 0804   05/09/24 0900  erythromycin  250 mg in sodium chloride  0.9 % 100 mL IVPB        250 mg 100 mL/hr over 60 Minutes Intravenous Every 8 hours 05/09/24 0732 05/11/24 0044   05/08/24 2200  cefoTEtan  (CEFOTAN ) 2 g in sodium chloride  0.9 % 100 mL IVPB        2 g 200 mL/hr over 30 Minutes Intravenous Every 12 hours 05/08/24 1759 05/09/24 0749   05/08/24 1400  neomycin  (MYCIFRADIN ) tablet 1,000 mg  Status:  Discontinued       Placed  in And Linked Group   1,000 mg Oral 3 times per day 05/08/24 1119 05/08/24 1120   05/08/24 1400  metroNIDAZOLE  (FLAGYL ) tablet 1,000 mg  Status:  Discontinued       Placed in And Linked Group   1,000 mg Oral 3 times per day 05/08/24 1119 05/08/24 1120   05/08/24 1130  cefoTEtan  (CEFOTAN ) 2 g in sodium chloride  0.9 % 100 mL IVPB        2 g 200 mL/hr over 30 Minutes Intravenous On call to O.R. 05/08/24 1119 05/09/24 0749         Note: Portions of this report may have been transcribed using voice recognition software. Every effort was made to ensure accuracy; however, inadvertent computerized transcription errors may be present.   Any transcriptional errors that result from this process are unintentional.    Elspeth KYM Schultze, MD, FACS, MASCRS Esophageal, Gastrointestinal & Colorectal Surgery Robotic and Minimally Invasive Surgery  [facility] [address], [address] [place], [place] [address] [phone] Fax [phone] Main  CONTACT INFORMATION: Weekday (9AM-5PM): Call [facility] main office at [phone] Weeknight (5PM-9AM) or Weekend/Holiday: Check EPIC Web Links tab & use AMION (password  [email or web address]) for General Surgery [facility] coverage  Please, DO NOT use SecureChat  (it is not reliable communication  to reach operating surgeons & will lead to a delay in care).   Epic staff messaging available for outptient concerns needing 1-2 business day response.      06/14/2024  7:25 AM

## 2024-06-14 NOTE — Progress Notes (Signed)
 PROGRESS NOTE    Elizabeth Odom  FMW:[id number] DOB: [birth year] DOA: 05/08/2024 PCP: Jesus Bernardino MATSU, MD   Brief Narrative: Elizabeth Odom is a 83 y.o. female with a history of atrial fibrillation, DVT, PE, anemia, PACs, hypertension, mild cognitive impairment, large bowel obstruction. Patient initially presented for surgical management involving history of diverticulitis with planned ostomy takedown with anastomosis. During hospitalization, patient developed altered mental status secondary to polypharmacy in addition to atrial fibrillation with RVR. Cardiology consulted for recommendation and management with subsequent improvement in RVR. On 6/24, patient underwent repeat laparoscopy converted to laparotomy with diverting loops transverse colostomy performed, in addition to drainage of an abdominal abscess and gastrostomy/rectal tube placement. Patient required admission to ICU post-operatively for respiratory failure but was extubated successfully on 6/25 and subsequent transferred out of ICU.    Assessment and Plan:  Persistent atrial fibrillation/flutter Cardiology consulted for management.  Medication adjusted to metoprolol  XL 25 mg daily with recommendation for Eliquis  when okay per surgery. Toprol -XL decreased to 12.5 mg daily. Eliquis  held and transitioned to heparin  IV. Heparin  now held secondary to acute anemia with concern for hematoma. -Continue Toprol -XL 12.5 mg daily  History of diverticulitis with perforation and abscess with colostomy status post ostomy takedown and re-do Ileus Per primary.  Infectious diseases consulted and are planning for management with multi week course of antibiotics.  Urine culture (6/12) significant for Pseudomonas, MRSA, Enterococcus faecalis, Candida albicans.  Drain culture (6/17) significant for rare Pseudomonas aeruginosa.  Ongoing antibiotics per infectious disease.  Patient now on daptomycin , micafungin , Zosyn . Patient underwent diverting  colostomy on 6/24 with gastrotomy tube placement. TPN weaned off. Tube feeds started. Tube feeds now held and TPN re-ordered. -Ongoing general surgery recommendations  Intraabdominal abscess Infectious disease consulted. Culture data is polymicrobial and evident for pseudomonas aeruginosa, staphylococcus aureus and candida albicans. -ID recommendations: Zosyn , daptomycin , micafungin   Acute blood loss anemia Initially secondary to surgery, requiring multiple transfusions. Patient with recurrent significant anemia with concern for intraabdominal abscess vs hematoma. Patient then developed blood per rectum requiring multiple transfusions. Patient has received a total of 9 units of PRBC via transfusion. -Follow-up post-transfusion H&H and transfuse as needed -Ongoing general surgery recommendations  Rectal output Unclear source as patient has a diverting colostomy, however she has minimal output from colostomy. Concern for possible fistula. -Per general surgery  AKI Mild. Resolved.  Hypoalbuminemia Noted. Associated poor nutrition.  Leukocytosis In setting of surgery. Improving.  Thrombocytosis Noted. Likely reactive. Mildly elevated and now resolved.  Hyponatremia Mild.  Syncope Syncopal episode occurred during hospitalization while patient was on commode.  Concern for possible vasovagal versus hypotension etiology.  Transthoracic echo obtained with evidence of preserved LVEF of 60 to 65% in addition to no aortic stenosis.  Acute urinary retention Foley inserted on 6/4. Flomax  started. Foley removed on 6/13. Flomax  discontinued.  Acute metabolic encephalopathy Delirium Resolved.  History of DVT/PE LLE swelling Patient previously did not tolerate anticoagulation, requiring the placement of a retrievable IVC filter on 10/05/2023. CT this admission confirms its placement at this time. -Check LE venous duplex  Generalized weakness Physical therapy is ordered already.   Discussed with patient that is important for her to participate as much as possible with therapy to improve her strength.   DVT prophylaxis: Heparin  IV (held). SCDs Code Status:   Code Status: Full Code Family Communication: None at bedside Disposition Plan: Discharge pending continued general surgery and ID recommendations. Anticipate discharge to SNF when medically ready.   Consultants:  General surgery PCCM Infectious disease  Procedures:    Antimicrobials:     Subjective: Patient reports ongoing abdominal pain, but reports no other concerns to me. Per nursing, patient was complaining of right lower leg pain.  Objective: BP (!) 148/88 (BP Location: Left Arm)   Pulse 92   Temp 97.8 F (36.6 C) (Oral)   Resp 18   Ht 5' 5 (1.651 m)   Wt 69.6 kg   SpO2 100%   BMI 25.53 kg/m   Examination:  General exam: Appears calm and comfortable. Chronically ill appearing. Respiratory system: Clear to auscultation. Respiratory effort normal. Cardiovascular system: S1 & S2 heard, RRR. No murmurs. Gastrointestinal system: Abdomen is nondistended, soft and nontender. Normal bowel sounds heard. Central nervous system: Alert and oriented.  Musculoskeletal: LLE edema compared to RLE.   Data Reviewed: I have personally reviewed following labs and imaging studies  CBC Lab Results  Component Value Date   WBC 9.3 06/14/2024   RBC 3.47 (L) 06/14/2024   HGB 9.7 (L) 06/14/2024   HCT 31.5 (L) 06/14/2024   MCV 90.8 06/14/2024   MCH 28.0 06/14/2024   PLT 360 06/14/2024   MCHC 30.8 06/14/2024   RDW 19.0 (H) 06/14/2024   LYMPHSABS 0.7 05/09/2024   MONOABS 1.8 (H) 05/09/2024   EOSABS 0.0 05/09/2024   BASOSABS 0.0 05/09/2024     Last metabolic panel Lab Results  Component Value Date   NA 134 (L) 06/14/2024   K 3.9 06/14/2024   CL 103 06/14/2024   CO2 22 06/14/2024   BUN 21 06/14/2024   CREATININE 0.60 06/14/2024   GLUCOSE 107 (H) 06/14/2024   GFRNONAA >60 06/14/2024    GFRAA >60 04/10/2016   CALCIUM  8.1 (L) 06/14/2024   PHOS 2.9 06/14/2024   PROT 6.3 (L) 06/14/2024   ALBUMIN  1.8 (L) 06/14/2024   LABGLOB 2.9 04/08/2024   BILITOT 1.2 06/14/2024   ALKPHOS 231 (H) 06/14/2024   AST 25 06/14/2024   ALT 18 06/14/2024   ANIONGAP 9 06/14/2024    GFR: Estimated Creatinine Clearance: 52.2 mL/min (by C-G formula based on SCr of 0.6 mg/dL).  Recent Results (from the past 240 hours)  Aerobic/Anaerobic Culture w Gram Stain (surgical/deep wound)     Status: None (Preliminary result)   Collection Time: 06/11/24  4:12 PM   Specimen: Abdomen; Abdominal Fluid  Result Value Ref Range Status   Specimen Description   Final    ABDOMEN Performed at [facility], [address], [place], [place] [address]    Special Requests   Final    ABDOMINAL FLUID Performed at [facility], [address], [place], [place] [address]    Gram Stain   Final    ABUNDANT WBC PRESENT, PREDOMINANTLY PMN ABUNDANT GRAM NEGATIVE RODS FEW GRAM POSITIVE COCCI    Culture   Final    MODERATE PSEUDOMONAS AERUGINOSA SUSCEPTIBILITIES TO FOLLOW HOLDING FOR POSSIBLE ANAEROBE Performed at [facility], [address] [place], [place] [address]    Report Status PENDING  Incomplete      Radiology Studies: No results found.      LOS: 37 days    Elgin Lam, MD [facility] 06/14/2024, 11:36 AM   If 7PM-7AM, please contact night-coverage [email or web address]

## 2024-06-14 NOTE — Progress Notes (Signed)
 PHARMACY - TOTAL PARENTERAL NUTRITION CONSULT NOTE   Indication: Prolonged ileus  Patient Measurements: Height: 5' 5 (165.1 cm) Weight: 69.6 kg (153 lb 7 oz) IBW/kg (Calculated) : 57 TPN AdjBW (KG): 61.3 Body mass index is 25.53 kg/m.  Assessment: 4 yoF with history of diverticulitis with perforation and abscess. 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 She requested ostomy takedown and underwent LOA, colostomy takedown, small bowel resection with anastomosis on 5/29. Pharmacy is consulted to dose TPN starting 6/2 for postop ileus.  Post-op course complicated by anastomotic leak, multiple-intraabdominal abscesses. Post-op bleeding. Patient is now s/p diverting loop transverse colostomy.  Glucose / Insulin : no Hx DM, A1c 5.1%. BG goal <180 -CBGs q4h remain < 150 -Not on SSI currently  Electrolytes:  Na 134 (previously maxed in TPN). CoCa 9.87 - 7/5: Gross specified Keep K>4, Mg>2, Phos>3. Intermittent potassium supplementation. Will give enteral potassium x 5 days with diuresis  Hepatic: albumin  remains low 1.8, Alk Phos elevated -TG 174 (6/28) > 205 (6/30)>107 (7/5)   I/O:  - MIVF: NS K+40 at 32ml/hr -UOP (foley) :  -Stool: 200 with multiple episodes of water  diarrhea. Place Flexiseal (loperamide ,  Peptobismol) -Drain:  1010 up  GI Imaging:  -6/9 CT: Dempsey dehiscence along the rectal anastomosis, free air and multiple fluid collections -6/16 CT: Similar appearance at the rectal anastomosis concerning for dehiscence -6/23: poss catheter occlusion of PERC drain, several fistulous of sigmoid colon, gas collection, rectal anastomosis with dehiscence cavity communicating to small bowel, Multiple additional small loculated fluid collection at new percutaneous drainage catheter site. Additional small loculated fluid collection small bowel mesentery.  - 7/1:  CT: Multiple complex fluid collections within the upper abdomen   GI Surgeries / Procedures: -5/29: LAR, SBR, colostomy takedown, LOA -6/10 drains placed x 2 in IR (midline abd, transgluteal) -6/17: Replacement of drainage catheter by IR -6/24: diverting loop transverse colostomy, I&D of abdominal abscess, gastrotomy tube placement, rectal tube placement.   Central access: Double lumen PICC placed 6/2 TPN start date: 6/2>7/1, resume 7/4>>  Nutritional Goals: Goal TPN rate is 70 mL/hr (provides 94 g of protein and 1680 kcals per day)  RD Assessment:  Estimated Needs Total Energy Estimated Needs: 1600-1750 kcals Total Protein Estimated Needs: 80-95 grams Total Fluid Estimated Needs: >/= 1.6L  Current Nutrition:  TPN 7/4>> Clear liquids as tolerated 7/5: Restart Vital HP at 76ml/hr  Multiple calorie counts this admission. Not meeting caloric needs. Refusing all supplements. Pt underwent G-tube placement 6/24. Tube feeds started 6/26, stopped for abdominal distention and discomfort.  6/30 Tube feeds started>>stopped 7/3   Plan:  Increase TPN back to 37ml/hr goal rate. Retry Vital HP at 5ml/hr Clear liquids as tolerated Electrolytes in TPN: No change Na 75  K 40 mEq/L Ca 5 mEq/L Mg 5 mEq/L Phos 15 mmol/L Cl:Ac 1:1 Resume sSSI q6h if CBGs trend up >150 Change IVF to NS at kvo with next bag of TPN Monitor TPN labs on Mon/Thurs, and as needed.   Casimir Camelia Ours, PharmD, BCPS Clinical Pharmacist 06/14/2024 8:57 AM

## 2024-06-14 NOTE — Progress Notes (Signed)
 Urinary drainage bag was leaking. Changed it accordingly. While doing patient care, writer noticed leakage from Flexi-Seal. New Flexi-seal applied as no more than 45 ml can placed inside balloon. Assessed rectum and no additional leakage noted. Wound care completed to abdomen as ordered. Patient incessantly complains of itching to her back. Redness and rash noted. Dr. Briana notified for possible assistance with any other interventions in addition to those already ordered by Dr. Sheldon. Mattress replacement was initiated per order. PRN pain medication given as ordered.

## 2024-06-14 NOTE — Progress Notes (Signed)
 Physical Therapy Treatment Patient Details Name: Elizabeth Odom MRN: [id number] DOB: 1941-01-01 Today's Date: 06/14/2024   History of Present Illness Pt is 83 y.o. female who presented on 05/08/24 for elective colostomy takedown. Course complicated by A-fib with RVR and then hypotension/rectal bleeding and syncope on 05/11/2024.  Patient  had postop ileus needing NG tube and TPN.  Imaging on 05/19/2024 showed possible abscesses with transgluteal drain on 6/10 and CT guided drain to abdomen and pelvic abscesses yielding pus and stool.  Back to OR 6/24 for LOA, colostomy placement, drainage of abscess, and placement of g-tube with hypotension in OR requiring vasopressors, able to extubate 6/25. 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    PT Comments  Pt initially not agreeable however 2 RNs into room to motivate and encouraged pt for OOB activity.  Pt assisted with sitting EOB and standing.  Utilized Stedy to assist pt over to recliner (with blue chair pressure redistribution pad and multiple pillows).  Pt requiring extensive assist for even bed mobility.  Patient will benefit from continued inpatient follow up therapy, <3 hours/day.    If plan is discharge home, recommend the following: Two people to help with walking and/or transfers;Two people to help with bathing/dressing/bathroom;Assistance with cooking/housework;Help with stairs or ramp for entrance;Assist for transportation   Can travel by private vehicle     No  Equipment Recommendations  Hospital bed;Wheelchair cushion (measurements PT);Wheelchair (measurements PT);Hoyer lift;BSC/3in1    Recommendations for Other Services       Precautions / Restrictions Precautions Precautions: Fall Precaution/Restrictions Comments:  ABD surgery,  abdominal JP, colostomy, rectal tube     Mobility  Bed Mobility Overal bed mobility: Needs Assistance Bed Mobility: Rolling, Sidelying to Sit Rolling: Max assist, +2 for physical assistance Sidelying to sit: Max assist, +2 for physical assistance       General bed mobility comments: verbal cues for log roll technique; assist for upper and lower body required due to pain    Transfers Overall transfer level: Needs assistance   Transfers: Sit to/from Stand Sit to Stand: Max assist, +2 physical assistance           General transfer comment: pt requiring increased assist to rise and stand erect for use of Stedy; assist for weakness and pain; remained on O2 [place], cautious of multiple lines/drains Transfer via Lift Equipment: Stedy  Ambulation/Gait                   Stairs             Wheelchair Mobility     Tilt Bed    Modified Rankin (Stroke Patients Only)       Balance                                            Communication Communication Communication: No apparent difficulties  Cognition Arousal: Alert Behavior During Therapy: Flat affect   PT - Cognitive impairments: Initiation, Attention, Sequencing                       PT - Cognition Comments: pt appears reluctant to initiate, requiring max cues Following commands: Impaired Following commands impaired: Follows  one step commands with increased time    Cueing Cueing Techniques: Verbal cues, Tactile cues, Gestural cues  Exercises      General Comments        Pertinent Vitals/Pain Pain Assessment Pain Assessment: Faces Faces Pain Scale: Hurts even more Pain Location: periarea, backside with mobility Pain Descriptors / Indicators: Moaning, Sore, Tender, Grimacing, Guarding Pain Intervention(s): Repositioned, Monitored during session, Premedicated before session    Home Living                          Prior Function             PT Goals (current goals can now be found in the care plan section) Progress towards PT goals: Progressing toward goals    Frequency    Min 2X/week      PT Plan      Co-evaluation              AM-PAC PT 6 Clicks Mobility   Outcome Measure  Help needed turning from your back to your side while in a flat bed without using bedrails?: Total Help needed moving from lying on your back to sitting on the side of a flat bed without using bedrails?: Total Help needed moving to and from a bed to a chair (including a wheelchair)?: Total Help needed standing up from a chair using your arms (e.g., wheelchair or bedside chair)?: Total Help needed to walk in hospital room?: Total Help needed climbing 3-5 steps with a railing? : Total 6 Click Score: 6    End of Session Equipment Utilized During Treatment: Gait belt;Oxygen Activity Tolerance: Patient limited by fatigue Patient left: in chair;with chair alarm set;with call bell/phone within reach Nurse Communication: Mobility status (nursing appears aware to use Stedy) PT Visit Diagnosis: Muscle weakness (generalized) (M62.81)     Time: 8484-8464 PT Time Calculation (min) (ACUTE ONLY): 20 min  Charges:    $Therapeutic Activity: 8-22 mins PT General Charges $$ ACUTE PT VISIT: 1 Visit                    Elizabeth PT, DPT Physical Therapist Acute Rehabilitation Services Office: [phone]   Elizabeth Odom 06/14/2024, 4:21 PM

## 2024-06-15 ENCOUNTER — Encounter (HOSPITAL_COMMUNITY)

## 2024-06-15 DIAGNOSIS — Z7189 Other specified counseling: Secondary | ICD-10-CM

## 2024-06-15 DIAGNOSIS — T8119XS Other postprocedural shock, sequela: Secondary | ICD-10-CM | POA: Diagnosis not present

## 2024-06-15 DIAGNOSIS — Z515 Encounter for palliative care: Secondary | ICD-10-CM | POA: Diagnosis not present

## 2024-06-15 DIAGNOSIS — E43 Unspecified severe protein-calorie malnutrition: Secondary | ICD-10-CM | POA: Diagnosis not present

## 2024-06-15 LAB — CBC
HCT: 32.1 % — ABNORMAL LOW (ref 36.0–46.0)
Hemoglobin: 9.9 g/dL — ABNORMAL LOW (ref 12.0–15.0)
MCH: 28.3 pg (ref 26.0–34.0)
MCHC: 30.8 g/dL (ref 30.0–36.0)
MCV: 91.7 fL (ref 80.0–100.0)
Platelets: 393 K/uL (ref 150–400)
RBC: 3.5 MIL/uL — ABNORMAL LOW (ref 3.87–5.11)
RDW: 18.8 % — ABNORMAL HIGH (ref 11.5–15.5)
WBC: 8.3 K/uL (ref 4.0–10.5)
nRBC: 0 % (ref 0.0–0.2)

## 2024-06-15 LAB — GLUCOSE, CAPILLARY
Glucose-Capillary: 145 mg/dL — ABNORMAL HIGH (ref 70–99)
Glucose-Capillary: 131 mg/dL — ABNORMAL HIGH (ref 70–99)
Glucose-Capillary: 145 mg/dL — ABNORMAL HIGH (ref 70–99)
Glucose-Capillary: 124 mg/dL — ABNORMAL HIGH (ref 70–99)
Glucose-Capillary: 115 mg/dL — ABNORMAL HIGH (ref 70–99)
Glucose-Capillary: 132 mg/dL — ABNORMAL HIGH (ref 70–99)

## 2024-06-15 LAB — BASIC METABOLIC PANEL WITH GFR
Anion gap: 8 (ref 5–15)
BUN: 18 mg/dL (ref 8–23)
CO2: 24 mmol/L (ref 22–32)
Calcium: 8.1 mg/dL — ABNORMAL LOW (ref 8.9–10.3)
Chloride: 102 mmol/L (ref 98–111)
Creatinine, Ser: 0.49 mg/dL (ref 0.44–1.00)
GFR, Estimated: >60 mL/min (ref >60)
Glucose, Bld: 134 mg/dL — ABNORMAL HIGH (ref 70–99)
Potassium: 3.6 mmol/L (ref 3.5–5.1)
Sodium: 134 mmol/L — ABNORMAL LOW (ref 135–145)

## 2024-06-15 LAB — MAGNESIUM: Magnesium: 1.9 mg/dL (ref 1.7–2.4)

## 2024-06-15 LAB — PHOSPHORUS: Phosphorus: 2.6 mg/dL (ref 2.5–4.6)

## 2024-06-15 MED ORDER — TRAVASOL 10 % IV SOLN
INTRAVENOUS | Status: AC
  Filled 2024-06-15: qty 940.8

## 2024-06-15 MED ORDER — ZINC OXIDE 40 % EX OINT
TOPICAL_OINTMENT | Freq: Two times a day (BID) | CUTANEOUS | Status: DC
  Administered 2024-06-16 – 2024-06-24 (×4): 1 via TOPICAL
  Filled 2024-06-15 (×2): qty 57

## 2024-06-15 MED ORDER — LORATADINE 10 MG PO TABS
10.0000 mg | ORAL_TABLET | Freq: Every day | ORAL | Status: DC
  Administered 2024-06-15 – 2024-06-16 (×2): 10 mg via ORAL
  Filled 2024-06-15 (×2): qty 1

## 2024-06-15 MED ORDER — DAPTOMYCIN-SODIUM CHLORIDE 700-0.9 MG/100ML-% IV SOLN
700.0000 mg | Freq: Every day | INTRAVENOUS | Status: DC
  Administered 2024-06-15 – 2024-06-23 (×9): 700 mg via INTRAVENOUS
  Filled 2024-06-15 (×10): qty 100

## 2024-06-15 MED ORDER — SODIUM CHLORIDE 0.9 % IV SOLN
1.0000 g | Freq: Three times a day (TID) | INTRAVENOUS | Status: DC
  Administered 2024-06-15 – 2024-06-24 (×27): 1 g via INTRAVENOUS
  Filled 2024-06-15 (×29): qty 20

## 2024-06-15 MED ORDER — LOPERAMIDE HCL 2 MG PO CAPS
4.0000 mg | ORAL_CAPSULE | Freq: Two times a day (BID) | ORAL | Status: DC
  Administered 2024-06-15 – 2024-06-16 (×2): 4 mg via ORAL
  Filled 2024-06-15 (×3): qty 2

## 2024-06-15 NOTE — Progress Notes (Signed)
 Patient was to see IR today for procedure. RN reached out to IR with no answer. Around 12noon RN paged MD Deward Null, to see if it was ok to give pt clears for today. MD gave verbal 'yes to clears'. Patient asked for a cup of tea and ginger ale. RN then seen & spoke with MD from IR and was told that the patient would be seen 7/7.

## 2024-06-15 NOTE — Progress Notes (Signed)
 PROGRESS NOTE    Elizabeth Odom  FMW:[id number] DOB: July 28, 1941 DOA: 05/08/2024 PCP: Jesus Bernardino MATSU, MD   Brief Narrative: Elizabeth Odom is a 83 y.o. female with a history of atrial fibrillation, DVT, PE, anemia, PACs, hypertension, mild cognitive impairment, large bowel obstruction. Patient initially presented for surgical management involving history of diverticulitis with planned ostomy takedown with anastomosis. During hospitalization, patient developed altered mental status secondary to polypharmacy in addition to atrial fibrillation with RVR. Cardiology consulted for recommendation and management with subsequent improvement in RVR. On 6/24, patient underwent repeat laparoscopy converted to laparotomy with diverting loops transverse colostomy performed, in addition to drainage of an abdominal abscess and gastrostomy/rectal tube placement. Patient required admission to ICU post-operatively for respiratory failure but was extubated successfully on 6/25 and subsequent transferred out of ICU.    Assessment and Plan:  Persistent atrial fibrillation/flutter Cardiology consulted for management.  Medication adjusted to metoprolol  XL 25 mg daily with recommendation for Eliquis  when okay per surgery. Toprol -XL decreased to 12.5 mg daily. Eliquis  held and transitioned to heparin  IV. Heparin  now held secondary to acute anemia with concern for hematoma. -Continue Toprol -XL 12.5 mg daily  History of diverticulitis with perforation and abscess with colostomy status post ostomy takedown and re-do Ileus Per primary.  Infectious diseases consulted and are planning for management with multi week course of antibiotics.  Urine culture (6/12) significant for Pseudomonas, MRSA, Enterococcus faecalis, Candida albicans.  Drain culture (6/17) significant for rare Pseudomonas aeruginosa.  Ongoing antibiotics per infectious disease.  Patient now on daptomycin , micafungin , Zosyn . Patient underwent diverting  colostomy on 6/24 with gastrotomy tube placement. TPN weaned off. Tube feeds started. Tube feeds now held and TPN re-ordered. -Ongoing general surgery recommendations  Intraabdominal abscess Infectious disease consulted. Culture data is polymicrobial and evident for pseudomonas aeruginosa, staphylococcus aureus and candida albicans. -ID recommendations: Zosyn , daptomycin , micafungin   Acute blood loss anemia Initially secondary to surgery, requiring multiple transfusions. Patient with recurrent significant anemia with concern for intraabdominal abscess vs hematoma. Patient then developed blood per rectum requiring multiple transfusions. Patient has received a total of 9 units of PRBC via transfusion. Hemoglobin stable. Will likely need to avoid anticoagulation completely in the future. -Follow-up post-transfusion H&H and transfuse as needed -Ongoing general surgery recommendations  Rectal output Unclear source as patient has a diverting colostomy, however she has minimal output from colostomy. Concern for possible fistula. -Per general surgery  AKI Mild. Resolved.  Hypoalbuminemia Noted. Associated poor nutrition. Patient is on TPN.  Leukocytosis In setting of surgery and infection. Resolved..  Thrombocytosis Noted. Likely reactive. Mildly elevated and now resolved.  Hyponatremia Mild. Stable.  Syncope Syncopal episode occurred during hospitalization while patient was on commode.  Concern for possible vasovagal versus hypotension etiology.  Transthoracic echo obtained with evidence of preserved LVEF of 60 to 65% in addition to no aortic stenosis.  Acute urinary retention Foley inserted on 6/4. Flomax  started. Foley removed on 6/13. Flomax  discontinued.  Acute metabolic encephalopathy Delirium Resolved.  History of DVT/PE LLE swelling Patient previously did not tolerate anticoagulation, requiring the placement of a retrievable IVC filter on 10/05/2023. CT this admission  confirms its placement at this time. -Follow-up LE venous duplex (pending)  Generalized weakness Physical therapy is ordered already.  Discussed with patient that is important for her to participate as much as possible with therapy to improve her strength.   DVT prophylaxis: SCDs Code Status:   Code Status: Full Code Family Communication: None at bedside Disposition Plan: Discharge  pending continued general surgery and ID recommendations. Anticipate discharge to SNF when medically ready.   Consultants:  General surgery PCCM Infectious disease  Procedures:    Antimicrobials:     Subjective: No issues overnight noted.  Objective: BP (!) 123/90 (BP Location: Left Arm)   Pulse 94   Temp 98.3 F (36.8 C) (Oral)   Resp 18   Ht 5' 5 (1.651 m)   Wt 69.6 kg   SpO2 99%   BMI 25.53 kg/m   Examination:  General exam: Appears calm and comfortable Respiratory system: Respiratory effort normal.   Data Reviewed: I have personally reviewed following labs and imaging studies  CBC Lab Results  Component Value Date   WBC 8.3 06/15/2024   RBC 3.50 (L) 06/15/2024   HGB 9.9 (L) 06/15/2024   HCT 32.1 (L) 06/15/2024   MCV 91.7 06/15/2024   MCH 28.3 06/15/2024   PLT 393 06/15/2024   MCHC 30.8 06/15/2024   RDW 18.8 (H) 06/15/2024   LYMPHSABS 0.7 05/09/2024   MONOABS 1.8 (H) 05/09/2024   EOSABS 0.0 05/09/2024   BASOSABS 0.0 05/09/2024     Last metabolic panel Lab Results  Component Value Date   NA 134 (L) 06/15/2024   K 3.6 06/15/2024   CL 102 06/15/2024   CO2 24 06/15/2024   BUN 18 06/15/2024   CREATININE 0.49 06/15/2024   GLUCOSE 134 (H) 06/15/2024   GFRNONAA >60 06/15/2024   GFRAA >60 04/10/2016   CALCIUM  8.1 (L) 06/15/2024   PHOS 2.6 06/15/2024   PROT 6.3 (L) 06/14/2024   ALBUMIN  1.8 (L) 06/14/2024   LABGLOB 2.9 04/08/2024   BILITOT 1.2 06/14/2024   ALKPHOS 231 (H) 06/14/2024   AST 25 06/14/2024   ALT 18 06/14/2024   ANIONGAP 8 06/15/2024     GFR: Estimated Creatinine Clearance: 52.2 mL/min (by C-G formula based on SCr of 0.49 mg/dL).  Recent Results (from the past 240 hours)  Aerobic/Anaerobic Culture w Gram Stain (surgical/deep wound)     Status: None (Preliminary result)   Collection Time: 06/11/24  4:12 PM   Specimen: Abdomen; Abdominal Fluid  Result Value Ref Range Status   Specimen Description   Final    ABDOMEN Performed at [facility], [address], [place], [place] [address]    Special Requests   Final    ABDOMINAL FLUID Performed at [facility], [address] [place], [place] [address]    Gram Stain   Final    ABUNDANT WBC PRESENT, PREDOMINANTLY PMN ABUNDANT GRAM NEGATIVE RODS FEW GRAM POSITIVE COCCI    Culture   Final    MODERATE PSEUDOMONAS AERUGINOSA SUSCEPTIBILITIES TO FOLLOW CULTURE REINCUBATED FOR BETTER GROWTH HOLDING FOR POSSIBLE ANAEROBE MODERATE CLOSTRIDIUM INNOCUUM Standardized susceptibility testing for this organism is not available. Performed at [facility], [address], [place], [place] [address]    Report Status PENDING  Incomplete      Radiology Studies: CT ABDOMEN PELVIS W CONTRAST Result Date: 06/14/2024 CLINICAL DATA:  Diarrhea diverting colostomy with anal diarrhea ?enterorectal fistula? EXAM: CT ABDOMEN AND PELVIS WITH CONTRAST TECHNIQUE: Multidetector CT imaging of the abdomen and pelvis was performed using the standard protocol following bolus administration of intravenous contrast. RADIATION DOSE REDUCTION: This exam was performed according to the departmental dose-optimization program which includes automated exposure control, adjustment of the mA and/or kV according to patient size and/or use of iterative reconstruction technique. CONTRAST:  OMNIPAQUE  IOHEXOL  300 MG/ML  SOLN COMPARISON:  06/10/2024 FINDINGS:  Lower chest: Moderate bilateral pleural effusions with compressive atelectasis in the lower lobes, unchanged.  Hepatobiliary: No focal hepatic abnormality. Gallbladder unremarkable. Pancreas: No focal abnormality or ductal dilatation. Spleen: No focal abnormality.  Normal size. Adrenals/Urinary Tract: Adrenal glands normal. 6.4 cm right lower pole renal cyst is stable. No follow-up imaging recommended. Punctate 1 mm nonobstructing stone in the lower pole of the left kidney. No hydronephrosis. Stomach/Bowel: Right lower quadrant diverting colostomy again noted, unchanged. Gastrostomy tube in the distal stomach, stable. Small bowel decompressed. Postoperative changes in the region of the rectum. No clearcut enterorectal fistula visualized. Vascular/Lymphatic: Aortic atherosclerosis. No evidence of aneurysm or adenopathy. IVC filter in place. Reproductive: Prior hysterectomy.  No adnexal masses. Other: Complex fluid collections again noted. Left upper abdominal complex fluid collection measures 7.1 x 5.8 cm compared to 6.6 x 6.0 cm previously. Large mixed density fluid collection containing locules of gas noted anteriorly in the pelvis in the region of prior surgical drain (now removed) measures 11 x 5.3 cm on image 57. High-density fluid collection noted deep to the umbilicus measuring 3.8 x 3.1 cm, similar to prior study. Small amount of free fluid throughout the abdomen and pelvis. Musculoskeletal: No acute bony abnormality. IMPRESSION: Postoperative changes with rectal stump and right lower quadrant transverse diverting colostomy. No well-defined or clear enterorectal fistula. Large mixed density fluid collections again noted in the abdomen and pelvis, possibly from prior hemorrhage. The upper abdominal wall fluid collection and the fluid collection deep to the umbilicus are similar to prior study. Anterior pelvic fluid collection containing air and mixed density fluid has increased in size. This is in the area of prior surgical drain, now removed. Moderate bilateral pleural effusions. Compressive atelectasis in the lower  lobes. Electronically Signed   By: Franky Crease M.D.   On: 06/14/2024 15:52        LOS: 38 days    Elgin Lam, MD [facility] 06/15/2024, 9:50 AM   If 7PM-7AM, please contact night-coverage [email or web address]

## 2024-06-15 NOTE — Progress Notes (Signed)
 06/15/2024  Elizabeth Odom [id number] 06/28/1941  CARE TEAM: PCP: Jesus Bernardino MATSU, MD  Outpatient Care Team: Patient Care Team: Jesus Bernardino MATSU, MD as PCP - General (Internal Medicine) [name], MD as Consulting Physician (Gastroenterology) [name], MD (Neurology) [name], MD as Consulting Physician (Urology) [name], MD as Consulting Physician (Cardiology) [name], MD as Consulting Physician (Colon and Rectal Surgery)  Inpatient Treatment Team: Treatment Team:  [name], MD [name], RN [name], MD Bobbette Clause, MD [name], A, OT [name], MD Erlenmeyer, Bernardino CHRISTELLA, RN Brien Blew, NT Janet Maurilio BROCKS, NT Vilchis, Tona LABOR, NT Mays, Diamone A, RN   Problem List:   Principal Problem:   Diverticular disease Active Problems:   A-fib [facility])   Restless leg   ABLA (acute blood loss anemia)   Need for emotional support   Acute urinary retention   Mixed hyperlipidemia   Essential hypertension   Diverticular disease of left colon   History of DVT (deep vein thrombosis)   History of pulmonary embolus (PE)   Pre-diabetes   Presence of IVC filter   Stricture of sigmoid colon (HCC)   Protein-calorie malnutrition, severe   Hypokalemia   S/P percutaneous endoscopic gastrostomy (PEG) tube placement [facility])   Goals of care, counseling/discussion   Shock, during or resulting from a surgical procedure   05/08/2024  POST-OPERATIVE DIAGNOSIS:   COLOSTOMY FOR RESECTION, DESIRE FOR OSTOMY TAKEDOWN RECTAL STRICTURE HISTORY OF DIVERTICULITIS WITH PERFORATION & ABSCESS   PROCEDURE:   -ROBOTIC RECTOSIGMOID RESECTION (LAR) -TAKEDOWN OF END COLOSTOMY WITH ANASTOMOSIS -RESECTION OF SMALL INTESTINE WITH ANASTOMOSIS -SMALL BOWEL REPAIR -LYSIS OF ADHESIONS x 115 MINUTES (66% OF CASE),  -INTRAOPERATIVE ASSESSMENT OF TISSUE VASCULAR PERFUSION USING ICG (indocyanine green ) IMMUNOFLUORESCENCE,  -TRANSVERSUS ABDOMINIS PLANE  (TAP) BLOCK - BILATERAL -FLEXIBLE SIGMOIDOSCOPY   SURGEON:  [name], MD   05/20/2024  Post procedural Dx: Post op abscess, multiple   Technically successful CT guided placed of a  10 Fr drainage catheter placement x 2  into the midline ventral and dorsal perirectal abscesses.     [name] LABOR, MD    05/28/2024  IMPRESSION: Satisfactory removal of the midline anterior abscess drainage catheter and placement of a new midline anterior pelvic drainage catheter as well as a posterior transgluteal drainage catheter in pelvic abscesses.     Electronically Signed   By: [name]    06/03/2024  POST-OPERATIVE DIAGNOSIS:   DELAYED COLORECTAL ANASTOMOTIC LEAK, NEED FOR FECAL DIVERSION CHRONIC PELVIC ABSCESS   PROCEDURE:   DIAGNOSTIC LAPAROSCOPY CONVERTED TO LAPAROTOMY DIVERTING LOOP TRANSVERSE COLOSTOMY DRAINAGE OF ABDOMINAL ABSCESS GASTROSTOMY TUBE PLACEMENT RECTAL TUBE PLACEMENT   SURGEON:  [name], MD   OR FINDINGS: Very dense concrete intraperitoneal and pelvic adhesions infraumbilically.  Bladder, small bowel, omentum walling off the lower pelvis with delayed anastomotic leak.   Near disruption of colorectal anastomosis by transanal examination.  Rectal tube placed through rectal stump to drain the pelvis more directly.   Diverting mid-transverse loop colostomy done.   Removal of old percutaneous abdominal and transgluteal drains.     Assessment [facility] Stay = 38 days) 12 Days Post-Op    Failure to thrive with delayed anastomotic leaks     Plan:  Delayed anastomotic leaks   Delayed colorectal anastomotic leak initially controlled with bowel rest and drains since patient declined colostomy diversion.    Then drain fell out & felt worse despite drain replacement.  Acquiesced to diverting loop transverse colostomy (lower abdomen extremely fixed).  6/25 with very fixed abdomen.    Transanal diarrhea started 7/2.    Continue  4 times daily Lomotil .  Add loperamide .  If maximal loperamide /Lomotil /iron  do not resolve diarrhea; then, consider starting tincture of opium  which usually works well but is hard to obtain as an outpatient.  I would hold off on Sandostatin as it usually does not help with small bowel fistula  Because of persistent diarrhea and leaking, okay with replacing rectal tube 7/3.  She had the diarrhea before the rectal tube was placed and it is sealing things off for right now.  Try to remove once diarrhea under control.    Still with perianal complaints.  Switch to Desitin cream for better barrier control given skin itching and irritation  Worsening diarrhea on low rate trophic TFs, so stop.  CT scan 7/5 raises concerns in my mind that ileal small bowel anastomosis may have eroded into the pelvis and is causing an ileal-rectal stump internal fistula.  This is the second breakdown despite TPN and bowel rest.  She would be extremely high risk and attempting to take this down at this time.  More strict bowel rest.  G-tube to gravity bag.  Sips okay.  Full TPN for nutrition for now.   Severe malnutrition   Back on TPN for now.  Infection:  -Zosyn  only given Pseudomonas the only thing growing on last 2 cultures -per ID Updated culture shows Clostridium inoculum would be helpful to get sensitivities to see if she is growing a more drug-resistant organism.   CT scan 7/5 shows left upper fluid collection larger without more significant fluid component.  Retry percutaneous drainage for better control.  Concerned that there may be a right-sided fluid collection but looks like mostly giant kidney cyst and some ascites around the liver.  See if any radiology wishes to try and sample and leave drain in there as well.  Midline wound superficial enough to do just packing for now   C/o Leg itching/weakness.  No evid of rash nor weakness.  Topical steroids.  Add enteral histamine blocker.  I would like to  hold off on IV steroids.  Main complaint seems to be perianal.  Defer to need for Duplex w TRH/medicine.  Pt w IVC filter already & significant probs w anticoagulation.    Some recurrent atrial fibrillation and rapid rate.    PO/Gtube metoprolol .  Add back amlodipine  since starting to get hypertensive again.  See if medicine agrees.  I think medicine placed back on telemetry.  Do not feel it is needed   Will defer back to them.  Acute blood loss anemia in the setting of anemia of chronic disease.    Transfused 6/26, 7/2.  Hemoglobin improved from 10-9.6 this morning.  Follow.    Suspect she has rectus sheath hematoma bleeding on heparin .  That is held.  Hemoglobin more stable currently reassuring.  CAT scan with more liquid hematoma collection.  I think this needs to be drained see if intervention neurology will be more successful and will leave a drain  I think she has failed full anticoagulation again.  I would be very hesitant to restart heparin  ever again given her numerous severe bleeding episodes on that despite her history of PE/DVT (has IVC filter as a result of prior bleeding problems) and chronic A-fib.  Perhaps can consider prophylactic heparin  or enoxaparin  if hemoglobin stable and no procedures for 48 hours.  See what internal medicine thinks.  No easy solutions here  No evidence of coagulopathy or thrombocytopenia.  Of note, she had a prior workup and follow-up that did not show any coagulopathy by Dr. Lanny with hematology.  May reconsult if persistent oozing/bleeding  IV iron .    Enteral iron  with vitamin C  6/30.  I would not trust enteral route to be totally adequate but may help  History of urinary retention.  Will keep Foley catheter for now, especially in light with rectal drainage to keep the skin clear.    -monitor electrolytes & replace as needed.  Keep K>4, Mg>2, Phos>3.   -Diuresis as tolerated.  Trying to get down to her presurgical weight (~62kg)  if possible.   Follow-up with anemia needing transfusion.  -Pain control.  Scheduled Tylenol .  More alert so I think we can increase her oxycodone  dose since she struggles with pain.  10 mg at night to help her sleep.    VTE prophylaxis.  Patient had severe bleeding issues on anticoagulation numerous hospitalizations.  Would avoid.  Trying to SCDs.  Already has IVC filter in place for prior PE/DVTs.  Patient has some leg swelling -standard of care is considering duplex of lower extremities but will defer to medicine.  Very guarded on doing anticoagulation on her yet again numerous bleeding complications she has had  -mobilize as tolerated to help recovery.  Try and get therapists involved to help her mobilize more.  Biggest challenge.    I updated the patient's status to the patient and nurse.  Called discussed with the patient's granddaughter who closely follows her 170/1.  Recommendations were made.  Questions were answered.  They expressed understanding & appreciation.  -Disposition: TBD.   There is no way she can go straight home.  She will need skilled nursing facility and rehab in the past.    Agree with palliative care.  I do not think we have a final point of futility but options are limited - neither I nor granddaughter desire prolonged suffering either.    See if we can turn things around in this coming week.     I reviewed last 24 h vitals and pain scores, last 48 h intake and output, last 24 h labs and trends, and last 24 h imaging results.  I have reviewed this patient's available data, including medical history, events of note, test results, etc as part of my evaluation.   A significant portion of that time was spent in counseling. Care during the described time interval was provided by me.  This care required moderate level of medical decision making.  06/15/2024    Subjective: (Chief complaint)  CAT scan done.  Tube feeds very low rate restarted.  Antidiarrheals increased.  Still  with diarrhea.  Patient with soreness in left upper abdomen only.  No nausea or vomiting.  Nursing in room.    Objective:  Vital signs:  Vitals:   06/14/24 0500 06/14/24 1241 06/14/24 2010 06/15/24 0440  BP:  134/81 129/86 (!) 123/90  Pulse:  (!) 110 100 94  Resp:  16 17 18   Temp:  97.7 F (36.5 C) 97.8 F (36.6 C) 98.3 F (36.8 C)  TempSrc:  Oral Oral Oral  SpO2:  100% 100% 99%  Weight: 69.6 kg     Height:        Last BM Date : 06/14/24  Intake/Output   Yesterday:  07/05 0701 - 07/06 0700 In: 2262.7 [P.O.:100; I.V.:1633; NG/GT:393.7; IV Piggyback:136.1] Out:  3000 [Urine:2300; Stool:700] This shift:  No intake/output data recorded.  Patient claims to have drank 3-4 full cups of water  - not recorded  Bowel function:  Flatus: YES  BM:  YES   Drain:  Gastrostomy tube with thin bilious effluent and gravity bag Foley catheter with clear yellow urine Rectal tube with thin green bilious effluent   Physical Exam:  General: Awake.  Sitting up.  More alert.  Asking for better judgment movement and medication           Eyes: PERRL, normal EOM.  Sclera clear.  No icterus Neuro: CN II-XII intact w/o focal sensory/motor deficits.  No facial droop. Lymph: No head/neck/groin lymphadenopathy Psych:  No delerium/psychosis/paranoia.  Oriented x 4 HENT: Normocephalic, Mucus membranes moist.  No thrush.  Mild HOH Neck: Supple, No tracheal deviation.  No obvious thyromegaly.  Chest: No pain to chest wall compression.  Good respiratory excursion.  Mild audible wheezing CV:  Pulses intact.  Regular rhythm.  No major extremity edema MS: Normal AROM mjr joints.  No obvious deformity  Abdomen:  Most of abdomen soft but left upper quadrant with firmness consistent with rectus sheath abdominal wall hematoma.  Mildy distended.  Mildly tender at incisions only.  No guarding.   Right upper quadrant mid transverse diverting loop colostomy pink with edema.  No gas nor stool Midline  incision with some granulation. superificial Gastrostomy tube left upper quadrant without any leaking   GU: Foley in place with light yellow clear urine  Rectal: Flexi-Seal tube  - thin brown effluent Ext:   No deformity.  No mjr edema.  No cyanosis Skin: No petechiae / purpurea.  No major sores.  Warm and dry    Results:   Cultures: Recent Results (from the past 720 hours)  MIC (1 Drug)-Abdominal drain abscess; 05/22/2024; Abdomen; MRSA; Daptomycin      Status: Abnormal   Collection Time: 05/22/24  1:54 PM   Specimen: Abdomen  Result Value Ref Range Status   Min Inhibitory Conc (1 Drug) Final report (A)  Corrected    Comment: (NOTE) Performed At: [facility] [address] [place], [place] [id number] Jennette Shorter MD Ey:[id number] CORRECTED ON 06/24 AT 0836: PREVIOUSLY REPORTED AS Preliminary report    Source ABSCESS  Final    Comment: Performed at [facility], [address], [place], [place] [address]  MIC Result     Status: Abnormal   Collection Time: 05/22/24  1:54 PM  Result Value Ref Range Status   Result 1 (MIC) Comment (A)  Final    Comment: (NOTE) Methicillin - resistant Staphylococcus aureus Identification performed by account, not confirmed by this laboratory. DAPTOMYCIN    <=1 UG/ML = SUSCEPTIBLE Performed At: [facility] [address] [place], [place] [id number] Jennette Shorter MD Ey:[id number]   Aerobic/Anaerobic Culture w Gram Stain (surgical/deep wound)     Status: None   Collection Time: 05/22/24  3:59 PM   Specimen: Abdomen  Result Value Ref Range Status   Specimen Description   Final    ABDOMEN Performed at [facility], [address] [place], [place] [address]    Special Requests   Final    ABDOMINAL DRAIN Performed at [facility], [address] [place], [place] [address]    Gram Stain   Final    ABUNDANT WBC PRESENT, PREDOMINANTLY PMN RARE GRAM POSITIVE COCCI RARE YEAST WITH  PSEUDOHYPHAE    Culture   Final    MODERATE PSEUDOMONAS AERUGINOSA  MODERATE STAPHYLOCOCCUS AUREUS SUSCEPTIBILITIES PERFORMED ON PREVIOUS CULTURE WITHIN THE LAST 5 DAYS. MODERATE CANDIDA ALBICANS SEE SEPARATE REPORT NO ANAEROBES ISOLATED Performed at [facility], [address], [place], [place] [address]    Report Status 06/04/2024 FINAL  Final   Organism ID, Bacteria PSEUDOMONAS AERUGINOSA  Final      Susceptibility   Pseudomonas aeruginosa - MIC*    CEFTAZIDIME 4 SENSITIVE Sensitive     CIPROFLOXACIN <=0.25 SENSITIVE Sensitive     GENTAMICIN 4 SENSITIVE Sensitive     IMIPENEM 2 SENSITIVE Sensitive     PIP/TAZO <=4 SENSITIVE Sensitive ug/mL    CEFEPIME 2 SENSITIVE Sensitive     * MODERATE PSEUDOMONAS AERUGINOSA  Aerobic/Anaerobic Culture w Gram Stain (surgical/deep wound)     Status: None   Collection Time: 05/22/24  3:59 PM   Specimen: Back  Result Value Ref Range Status   Specimen Description   Final    BACK Performed at [facility], [address] [place], [place] [address]    Special Requests   Final    TRANSGLUTEAL PERCUTANEOUS IR DRAIN Performed at [facility], [address] [place], [place] [address]    Gram Stain   Final    NO WBC SEEN ABUNDANT GRAM NEGATIVE RODS RARE GRAM POSITIVE COCCI IN PAIRS RARE BUDDING YEAST SEEN    Culture   Final    ABUNDANT PSEUDOMONAS AERUGINOSA ABUNDANT METHICILLIN RESISTANT STAPHYLOCOCCUS AUREUS ABUNDANT ENTEROCOCCUS FAECALIS ABUNDANT CANDIDA ALBICANS NO ANAEROBES ISOLATED Sent to [facility] for further susceptibility testing. Performed at [facility], [address] [place], [place] [address]    Report Status 05/27/2024 FINAL  Final   Organism ID, Bacteria PSEUDOMONAS AERUGINOSA  Final   Organism ID, Bacteria METHICILLIN RESISTANT STAPHYLOCOCCUS AUREUS  Final   Organism ID, Bacteria ENTEROCOCCUS FAECALIS  Final      Susceptibility   Enterococcus faecalis - MIC*     AMPICILLIN <=2 SENSITIVE Sensitive     VANCOMYCIN  1 SENSITIVE Sensitive     GENTAMICIN SYNERGY SENSITIVE Sensitive     * ABUNDANT ENTEROCOCCUS FAECALIS   Methicillin resistant staphylococcus aureus - MIC*    CIPROFLOXACIN >=8 RESISTANT Resistant     ERYTHROMYCIN  >=8 RESISTANT Resistant     GENTAMICIN <=0.5 SENSITIVE Sensitive     OXACILLIN >=4 RESISTANT Resistant     TETRACYCLINE <=1 SENSITIVE Sensitive     VANCOMYCIN  1 SENSITIVE Sensitive     TRIMETH/SULFA >=320 RESISTANT Resistant     CLINDAMYCIN <=0.25 SENSITIVE Sensitive     RIFAMPIN <=0.5 SENSITIVE Sensitive     Inducible Clindamycin NEGATIVE Sensitive     LINEZOLID 2 SENSITIVE Sensitive     * ABUNDANT METHICILLIN RESISTANT STAPHYLOCOCCUS AUREUS   Pseudomonas aeruginosa - MIC*    CEFTAZIDIME 4 SENSITIVE Sensitive     CIPROFLOXACIN <=0.25 SENSITIVE Sensitive     GENTAMICIN 4 SENSITIVE Sensitive     IMIPENEM 2 SENSITIVE Sensitive     PIP/TAZO <=4 SENSITIVE Sensitive ug/mL    CEFEPIME 2 SENSITIVE Sensitive     * ABUNDANT PSEUDOMONAS AERUGINOSA  Yeast Susceptibilities     Status: None   Collection Time: 05/22/24  3:59 PM  Result Value Ref Range Status   SOURCE CANDIDA ALBICAN ABD ABSC  Final    Comment: Performed at [facility], [address], [place], [place] [address]   Organism ID, Yeast Candida albicans  Final    Comment: (NOTE) Identification performed by account, not confirmed by this laboratory.  Amphotericin B MIC 0.25 ug/mL  Final    Comment: (NOTE) Breakpoints have been established for only some organism-drug combinations as indicated. This test was developed and its performance characteristics determined by [facility]. It has not been cleared or approved by the Food and Drug Administration.    Anidulafungin MIC Comment  Final    Comment: (NOTE) 0.03 ug/mL Susceptible Breakpoints have been established for only some organism-drug combinations as indicated. This test was developed and its performance  characteristics determined by [facility]. It has not been cleared or approved by the Food and Drug Administration.    Caspofungin MIC Comment  Final    Comment: (NOTE) 0.12 ug/mL Susceptible Breakpoints have been established for only some organism-drug combinations as indicated. This test was developed and its performance characteristics determined by [facility]. It has not been cleared or approved by the Food and Drug Administration.    Fluconazole  Islt MIC 1.0 ug/mL Susceptible  Final    Comment: (NOTE) Breakpoints have been established for only some organism-drug combinations as indicated. This test was developed and its performance characteristics determined by [facility]. It has not been cleared or approved by the Food and Drug Administration.    ISAVUCONAZOLE MIC 0.008 ug/mL or less  Final    Comment: (NOTE) This test was developed and its performance characteristics determined by [facility]. It has not been cleared or approved by the Food and Drug Administration.    Itraconazole MIC 0.06 ug/mL  Final    Comment: (NOTE) Breakpoints have been established for only some organism-drug combinations as indicated. This test was developed and its performance characteristics determined by [facility]. It has not been cleared or approved by the Food and Drug Administration.    Micafungin  MIC Comment  Final    Comment: (NOTE) 0.008 ug/mL or less, Susceptible Breakpoints have been established for only some organism-drug combinations as indicated. This test was developed and its performance characteristics determined by [facility]. It has not been cleared or approved by the Food and Drug Administration.    Posaconazole MIC 0.06 ug/mL  Final    Comment: (NOTE) Breakpoints have been established for only some organism-drug combinations as indicated. This test was developed and its performance characteristics determined by [facility]. It has not been cleared or approved by the Food and Drug  Administration.    REZAFUNGIN MIC Comment  Final    Comment: (NOTE) 0.15 ug/mL Susceptible This test was developed and its performance characteristics determined by [facility]. It has not been cleared or approved by the Food and Drug Administration.    Voriconazole MIC Comment  Final    Comment: (NOTE) 0.15 ug/mL Susceptible Breakpoints have been established for only some organism-drug combinations as indicated. This test was developed and its performance characteristics determined by [facility]. It has not been cleared or approved by the Food and Drug Administration. Performed At: [facility] [address] [place], [place] [id number] Jennette Shorter MD Ey:[id number]   Aerobic/Anaerobic Culture w Gram Stain (surgical/deep wound)     Status: None   Collection Time: 05/27/24  5:19 PM   Specimen: Abscess  Result Value Ref Range Status   Specimen Description   Final    ABSCESS ABDOMEN Performed at [facility], [address], [place], [place] [address]    Special Requests   Final    NONE Performed at [facility], [address], [place], [place] [address]    Gram Stain   Final    ABUNDANT WBC PRESENT, PREDOMINANTLY PMN NO ORGANISMS  SEEN    Culture   Final    RARE PSEUDOMONAS AERUGINOSA RARE CANDIDA ALBICANS NO ANAEROBES ISOLATED Performed at [facility], [address] [place], [place] [address]    Report Status 06/01/2024 FINAL  Final   Organism ID, Bacteria PSEUDOMONAS AERUGINOSA  Final      Susceptibility   Pseudomonas aeruginosa - MIC*    CEFTAZIDIME 4 SENSITIVE Sensitive     CIPROFLOXACIN <=0.25 SENSITIVE Sensitive     GENTAMICIN 4 SENSITIVE Sensitive     IMIPENEM 2 SENSITIVE Sensitive     PIP/TAZO <=4 SENSITIVE Sensitive ug/mL    CEFEPIME 2 SENSITIVE Sensitive     * RARE PSEUDOMONAS AERUGINOSA  Surgical pcr screen     Status: Abnormal   Collection Time: 06/02/24  7:55 PM   Specimen: Nasal Mucosa; Nasal Swab   Result Value Ref Range Status   MRSA, PCR POSITIVE (A) NEGATIVE Final   Staphylococcus aureus POSITIVE (A) NEGATIVE Final    Comment: (NOTE) The Xpert SA Assay (FDA approved for NASAL specimens in patients 59 years of age and older), is one component of a comprehensive surveillance program. It is not intended to diagnose infection nor to guide or monitor treatment. Performed at [facility], [address], [place], [place] [address]   Aerobic/Anaerobic Culture w Gram Stain (surgical/deep wound)     Status: None (Preliminary result)   Collection Time: 06/11/24  4:12 PM   Specimen: Abdomen; Abdominal Fluid  Result Value Ref Range Status   Specimen Description   Final    ABDOMEN Performed at [facility], [address], [place], [place] [address]    Special Requests   Final    ABDOMINAL FLUID Performed at [facility], [address], [place], [place] [address]    Gram Stain   Final    ABUNDANT WBC PRESENT, PREDOMINANTLY PMN ABUNDANT GRAM NEGATIVE RODS FEW GRAM POSITIVE COCCI    Culture   Final    MODERATE PSEUDOMONAS AERUGINOSA SUSCEPTIBILITIES TO FOLLOW CULTURE REINCUBATED FOR BETTER GROWTH HOLDING FOR POSSIBLE ANAEROBE MODERATE CLOSTRIDIUM INNOCUUM Standardized susceptibility testing for this organism is not available. Performed at [facility], [address] [place], [place] [address]    Report Status PENDING  Incomplete    Labs: Results for orders placed or performed during the hospital encounter of 05/08/24 (from the past 48 hours)  Glucose, capillary     Status: Abnormal   Collection Time: 06/13/24  7:55 AM  Result Value Ref Range   Glucose-Capillary 108 (H) 70 - 99 mg/dL    Comment: Glucose reference range applies only to samples taken after fasting for at least 8 hours.  Glucose, capillary     Status: Abnormal   Collection Time: 06/13/24 12:09 PM  Result Value Ref Range   Glucose-Capillary 113  (H) 70 - 99 mg/dL    Comment: Glucose reference range applies only to samples taken after fasting for at least 8 hours.  Glucose, capillary     Status: Abnormal   Collection Time: 06/13/24  4:12 PM  Result Value Ref Range   Glucose-Capillary 100 (H) 70 - 99 mg/dL    Comment: Glucose reference range applies only to samples taken after fasting for at least 8 hours.  Glucose, capillary     Status: None   Collection Time: 06/13/24  8:39 PM  Result Value Ref Range   Glucose-Capillary 89 70 - 99 mg/dL    Comment: Glucose reference range applies  only to samples taken after fasting for at least 8 hours.  Glucose, capillary     Status: None   Collection Time: 06/14/24 12:23 AM  Result Value Ref Range   Glucose-Capillary 95 70 - 99 mg/dL    Comment: Glucose reference range applies only to samples taken after fasting for at least 8 hours.  CBC     Status: Abnormal   Collection Time: 06/14/24  2:33 AM  Result Value Ref Range   WBC 9.3 4.0 - 10.5 K/uL   RBC 3.47 (L) 3.87 - 5.11 MIL/uL   Hemoglobin 9.7 (L) 12.0 - 15.0 g/dL   HCT 68.4 (L) 63.9 - 53.9 %   MCV 90.8 80.0 - 100.0 fL   MCH 28.0 26.0 - 34.0 pg   MCHC 30.8 30.0 - 36.0 g/dL   RDW 80.9 (H) 88.4 - 84.4 %   Platelets 360 150 - 400 K/uL   nRBC 0.2 0.0 - 0.2 %    Comment: Performed at [facility], [address] [place], [place] [address]  Comprehensive metabolic panel     Status: Abnormal   Collection Time: 06/14/24  2:33 AM  Result Value Ref Range   Sodium 134 (L) 135 - 145 mmol/L   Potassium 3.9 3.5 - 5.1 mmol/L   Chloride 103 98 - 111 mmol/L   CO2 22 22 - 32 mmol/L   Glucose, Bld 107 (H) 70 - 99 mg/dL    Comment: Glucose reference range applies only to samples taken after fasting for at least 8 hours.   BUN 21 8 - 23 mg/dL   Creatinine, Ser 9.39 0.44 - 1.00 mg/dL   Calcium  8.1 (L) 8.9 - 10.3 mg/dL   Total Protein 6.3 (L) 6.5 - 8.1 g/dL   Albumin  1.8 (L) 3.5 - 5.0 g/dL   AST 25 15 - 41 U/L   ALT 18 0 - 44  U/L   Alkaline Phosphatase 231 (H) 38 - 126 U/L   Total Bilirubin 1.2 0.0 - 1.2 mg/dL   GFR, Estimated >39 >39 mL/min    Comment: (NOTE) Calculated using the CKD-EPI Creatinine Equation (2021)    Anion gap 9 5 - 15    Comment: Performed at [facility], [address], [place], [place] [address]  Magnesium      Status: None   Collection Time: 06/14/24  2:33 AM  Result Value Ref Range   Magnesium  2.0 1.7 - 2.4 mg/dL    Comment: Performed at [facility], [address] [place], [place] [address]  Phosphorus     Status: None   Collection Time: 06/14/24  2:33 AM  Result Value Ref Range   Phosphorus 2.9 2.5 - 4.6 mg/dL    Comment: Performed at [facility], [address], [place], [place] [address]  Glucose, capillary     Status: Abnormal   Collection Time: 06/14/24  4:10 AM  Result Value Ref Range   Glucose-Capillary 103 (H) 70 - 99 mg/dL    Comment: Glucose reference range applies only to samples taken after fasting for at least 8 hours.  Glucose, capillary     Status: Abnormal   Collection Time: 06/14/24  7:28 AM  Result Value Ref Range   Glucose-Capillary 111 (H) 70 - 99 mg/dL    Comment: Glucose reference range applies only to samples taken after fasting for at least 8 hours.  Glucose, capillary     Status: Abnormal   Collection Time: 06/14/24 11:22 AM  Result Value Ref Range   Glucose-Capillary 128 (H) 70 - 99 mg/dL    Comment: Glucose reference range applies only to samples taken after fasting for at least 8 hours.  Glucose, capillary     Status: Abnormal   Collection Time: 06/14/24  8:09 PM  Result Value Ref Range   Glucose-Capillary 142 (H) 70 - 99 mg/dL    Comment: Glucose reference range applies only to samples taken after fasting for at least 8 hours.  Glucose, capillary     Status: Abnormal   Collection Time: 06/14/24 11:44 PM  Result Value Ref Range   Glucose-Capillary 135 (H) 70 - 99 mg/dL    Comment:  Glucose reference range applies only to samples taken after fasting for at least 8 hours.  CBC     Status: Abnormal   Collection Time: 06/15/24  4:14 AM  Result Value Ref Range   WBC 8.3 4.0 - 10.5 K/uL   RBC 3.50 (L) 3.87 - 5.11 MIL/uL   Hemoglobin 9.9 (L) 12.0 - 15.0 g/dL   HCT 67.8 (L) 63.9 - 53.9 %   MCV 91.7 80.0 - 100.0 fL   MCH 28.3 26.0 - 34.0 pg   MCHC 30.8 30.0 - 36.0 g/dL   RDW 81.1 (H) 88.4 - 84.4 %   Platelets 393 150 - 400 K/uL   nRBC 0.0 0.0 - 0.2 %    Comment: Performed at [facility], [address] [place], [place] [address]  Basic metabolic panel with GFR     Status: Abnormal   Collection Time: 06/15/24  4:14 AM  Result Value Ref Range   Sodium 134 (L) 135 - 145 mmol/L   Potassium 3.6 3.5 - 5.1 mmol/L   Chloride 102 98 - 111 mmol/L   CO2 24 22 - 32 mmol/L   Glucose, Bld 134 (H) 70 - 99 mg/dL    Comment: Glucose reference range applies only to samples taken after fasting for at least 8 hours.   BUN 18 8 - 23 mg/dL   Creatinine, Ser 9.50 0.44 - 1.00 mg/dL   Calcium  8.1 (L) 8.9 - 10.3 mg/dL   GFR, Estimated >39 >39 mL/min    Comment: (NOTE) Calculated using the CKD-EPI Creatinine Equation (2021)    Anion gap 8 5 - 15    Comment: Performed at [facility], [address], [place], [place] [address]  Magnesium      Status: None   Collection Time: 06/15/24  4:14 AM  Result Value Ref Range   Magnesium  1.9 1.7 - 2.4 mg/dL    Comment: Performed at [facility], [address], [place], [place] [address]  Phosphorus     Status: None   Collection Time: 06/15/24  4:14 AM  Result Value Ref Range   Phosphorus 2.6 2.5 - 4.6 mg/dL    Comment: Performed at [facility], [address], [place], [place] [address]  Glucose, capillary     Status: Abnormal   Collection Time: 06/15/24  5:44 AM  Result Value Ref Range   Glucose-Capillary 145 (H) 70 - 99 mg/dL    Comment: Glucose reference range  applies only to samples taken after fasting for at least 8 hours.    Imaging / Studies: CT ABDOMEN PELVIS W CONTRAST Result Date: 06/14/2024 CLINICAL DATA:  Diarrhea diverting colostomy with anal diarrhea ?enterorectal fistula? EXAM: CT ABDOMEN AND PELVIS WITH CONTRAST TECHNIQUE: Multidetector CT imaging of the abdomen and pelvis was performed using the  standard protocol following bolus administration of intravenous contrast. RADIATION DOSE REDUCTION: This exam was performed according to the departmental dose-optimization program which includes automated exposure control, adjustment of the mA and/or kV according to patient size and/or use of iterative reconstruction technique. CONTRAST:  OMNIPAQUE  IOHEXOL  300 MG/ML  SOLN COMPARISON:  06/10/2024 FINDINGS: Lower chest: Moderate bilateral pleural effusions with compressive atelectasis in the lower lobes, unchanged. Hepatobiliary: No focal hepatic abnormality. Gallbladder unremarkable. Pancreas: No focal abnormality or ductal dilatation. Spleen: No focal abnormality.  Normal size. Adrenals/Urinary Tract: Adrenal glands normal. 6.4 cm right lower pole renal cyst is stable. No follow-up imaging recommended. Punctate 1 mm nonobstructing stone in the lower pole of the left kidney. No hydronephrosis. Stomach/Bowel: Right lower quadrant diverting colostomy again noted, unchanged. Gastrostomy tube in the distal stomach, stable. Small bowel decompressed. Postoperative changes in the region of the rectum. No clearcut enterorectal fistula visualized. Vascular/Lymphatic: Aortic atherosclerosis. No evidence of aneurysm or adenopathy. IVC filter in place. Reproductive: Prior hysterectomy.  No adnexal masses. Other: Complex fluid collections again noted. Left upper abdominal complex fluid collection measures 7.1 x 5.8 cm compared to 6.6 x 6.0 cm previously. Large mixed density fluid collection containing locules of gas noted anteriorly in the pelvis in the region of prior  surgical drain (now removed) measures 11 x 5.3 cm on image 57. High-density fluid collection noted deep to the umbilicus measuring 3.8 x 3.1 cm, similar to prior study. Small amount of free fluid throughout the abdomen and pelvis. Musculoskeletal: No acute bony abnormality. IMPRESSION: Postoperative changes with rectal stump and right lower quadrant transverse diverting colostomy. No well-defined or clear enterorectal fistula. Large mixed density fluid collections again noted in the abdomen and pelvis, possibly from prior hemorrhage. The upper abdominal wall fluid collection and the fluid collection deep to the umbilicus are similar to prior study. Anterior pelvic fluid collection containing air and mixed density fluid has increased in size. This is in the area of prior surgical drain, now removed. Moderate bilateral pleural effusions. Compressive atelectasis in the lower lobes. Electronically Signed   By: Franky Crease M.D.   On: 06/14/2024 15:52         Medications / Allergies: per chart  Antibiotics: Anti-infectives (From admission, onward)    Start     Dose/Rate Route Frequency Ordered Stop   06/11/24 1545  piperacillin -tazobactam (ZOSYN ) IVPB 3.375 g        3.375 g 12.5 mL/hr over 240 Minutes Intravenous Every 8 hours 06/11/24 1457     06/09/24 1745  DAPTOmycin  (CUBICIN ) IVPB 500 mg/50mL premix  Status:  Discontinued        500 mg 100 mL/hr over 30 Minutes Intravenous Daily 06/09/24 1647 06/13/24 1539   06/06/24 1000  micafungin  (MYCAMINE ) 100 mg in sodium chloride  0.9 % 100 mL IVPB  Status:  Discontinued        100 mg 105 mL/hr over 1 Hours Intravenous Every 24 hours 06/05/24 1349 06/12/24 1142   06/04/24 1400  piperacillin -tazobactam (ZOSYN ) IVPB 3.375 g        3.375 g 12.5 mL/hr over 240 Minutes Intravenous Every 8 hours 06/04/24 0738 06/09/24 2124   06/04/24 1400  DAPTOmycin  (CUBICIN ) IVPB 500 mg/22mL premix        8 mg/kg  60.9 kg 100 mL/hr over 30 Minutes Intravenous Daily  06/04/24 0738 06/10/24 0126   06/03/24 1000  neomycin  (MYCIFRADIN ) tablet 500 mg  Status:  Discontinued        500 mg Oral 3  times daily 06/03/24 0935 06/03/24 1555   06/03/24 1000  metroNIDAZOLE  (FLAGYL ) tablet 500 mg  Status:  Discontinued       Note to Pharmacy: Take 2 pills (=1000mg ) by mouth at 1pm, 3pm, and 10pm the day before your colorectal operation   500 mg Oral 3 times daily 06/03/24 0935 06/03/24 1555   05/29/24 1500  DAPTOmycin  (CUBICIN ) IVPB 500 mg/50mL premix  Status:  Discontinued        8 mg/kg  60.9 kg 100 mL/hr over 30 Minutes Intravenous Daily 05/29/24 1359 06/04/24 0738   05/28/24 2100  vancomycin  (VANCOREADY) IVPB 750 mg/150 mL  Status:  Discontinued        750 mg 150 mL/hr over 60 Minutes Intravenous Every 12 hours 05/28/24 1419 05/29/24 1359   05/27/24 0800  vancomycin  (VANCOCIN ) IVPB 1000 mg/200 mL premix  Status:  Discontinued        1,000 mg 200 mL/hr over 60 Minutes Intravenous Every 24 hours 05/26/24 1144 05/28/24 1419   05/23/24 1400  piperacillin -tazobactam (ZOSYN ) IVPB 3.375 g  Status:  Discontinued        3.375 g 12.5 mL/hr over 240 Minutes Intravenous Every 8 hours 05/23/24 0804 06/04/24 0738   05/23/24 1200  micafungin  (MYCAMINE ) 100 mg in sodium chloride  0.9 % 100 mL IVPB        100 mg 105 mL/hr over 1 Hours Intravenous Every 24 hours 05/23/24 0755 06/05/24 1221   05/22/24 0800  vancomycin  (VANCOCIN ) IVPB 1000 mg/200 mL premix  Status:  Discontinued        1,000 mg 200 mL/hr over 60 Minutes Intravenous Every 24 hours 05/21/24 0634 05/26/24 0806   05/21/24 0730  vancomycin  (VANCOREADY) IVPB 1250 mg/250 mL        1,250 mg 166.7 mL/hr over 90 Minutes Intravenous  Once 05/21/24 0634 05/21/24 1043   05/19/24 1400  piperacillin -tazobactam (ZOSYN ) IVPB 3.375 g  Status:  Discontinued        3.375 g 12.5 mL/hr over 240 Minutes Intravenous Every 8 hours 05/19/24 1303 05/23/24 0804   05/09/24 0900  erythromycin  250 mg in sodium chloride  0.9 % 100 mL IVPB         250 mg 100 mL/hr over 60 Minutes Intravenous Every 8 hours 05/09/24 0732 05/11/24 0044   05/08/24 2200  cefoTEtan  (CEFOTAN ) 2 g in sodium chloride  0.9 % 100 mL IVPB        2 g 200 mL/hr over 30 Minutes Intravenous Every 12 hours 05/08/24 1759 05/09/24 0749   05/08/24 1400  neomycin  (MYCIFRADIN ) tablet 1,000 mg  Status:  Discontinued       Placed in And Linked Group   1,000 mg Oral 3 times per day 05/08/24 1119 05/08/24 1120   05/08/24 1400  metroNIDAZOLE  (FLAGYL ) tablet 1,000 mg  Status:  Discontinued       Placed in And Linked Group   1,000 mg Oral 3 times per day 05/08/24 1119 05/08/24 1120   05/08/24 1130  cefoTEtan  (CEFOTAN ) 2 g in sodium chloride  0.9 % 100 mL IVPB        2 g 200 mL/hr over 30 Minutes Intravenous On call to O.R. 05/08/24 1119 05/09/24 0749         Note: Portions of this report may have been transcribed using voice recognition software. Every effort was made to ensure accuracy; however, inadvertent computerized transcription errors may be present.   Any transcriptional errors that result from this process are unintentional.    Elspeth KYM Schultze,  MD, FACS, MASCRS Esophageal, Gastrointestinal & Colorectal Surgery Robotic and Minimally Invasive Surgery  [facility] [address], [address] [place], [place] [address] [phone] Fax [phone] Main  CONTACT INFORMATION: Weekday (9AM-5PM): Call [facility] main office at [phone] Weeknight (5PM-9AM) or Weekend/Holiday: Check EPIC Web Links tab & use AMION (password  [email or web address]) for General Surgery [facility] coverage  Please, DO NOT use SecureChat  (it is not reliable communication to reach operating surgeons & will lead to a delay in care).   Epic staff messaging available for outptient concerns needing 1-2 business day response.      06/15/2024  7:47 AM

## 2024-06-15 NOTE — Progress Notes (Signed)
 Referring Physician(s): Dr. Elspeth Schultze  Supervising Physician: Jenna Hacker  Patient Status:  Elizabeth Odom  Chief Complaint: Intra-abdominal fluid collections  Subjective: Known to IR from recent aspiration of fluid collection in the anterior pelvic insufficient for drain placement. Large collections with thickened fluid unable to aspirated.  Repeat imaging 06/14/24 showed: Large mixed density fluid collections again noted in the abdomen and pelvis, possibly from prior hemorrhage. The upper abdominal wall fluid collection and the fluid collection deep to the umbilicus are similar to prior study. Anterior pelvic fluid collection containing air and mixed density fluid has increased in size  IR consulted for re-attempt of aspiration/drainage of enlarging collection.   Allergies: Elemental sulfur and Heparin   Medications: Prior to Admission medications   Medication Sig Start Date End Date Taking? Authorizing Provider  amLODipine  (NORVASC ) 5 MG tablet Take 1 tablet (5 mg total) by mouth daily. Patient taking differently: Take 5 mg by mouth at bedtime. 04/08/24  Yes Patwardhan, Manish J, MD  ascorbic acid  (VITAMIN C ) 250 MG CHEW Chew 250 mg by mouth daily.   Yes [provider]  aspirin  EC 81 MG tablet Take 81 mg by mouth daily. Swallow whole.   Yes [provider]  cyanocobalamin  (VITAMIN B12) 1000 MCG tablet Take 1,000 mcg by mouth daily.   Yes [provider]  FIBER GUMMIES PO Take 1 capsule by mouth daily.   Yes [provider]  Multiple Vitamin (MULTIVITAMIN WITH MINERALS) TABS tablet Take 1 tablet by mouth daily. 11/10/23  Yes Tobie Yetta HERO, MD  Omega-3 Fatty Acids (OMEGA 3 500 PO) Take 500 mg by mouth daily.   Yes [provider]  OVER THE COUNTER MEDICATION Take 650 mg by mouth daily. Total Beets supplement   Yes [provider]  traMADol  (ULTRAM ) 50 MG tablet Take 1-2 tablets (50-100 mg total) by mouth every 6  (six) hours as needed for moderate pain (pain score 4-6) or severe pain (pain score 7-10). 05/08/24  Yes Schultze Elspeth, MD     Vital Signs: BP (!) 123/90 (BP Location: Left Arm)   Pulse 94   Temp 98.3 F (36.8 C) (Oral)   Resp 18   Ht 5' 5 (1.651 m)   Wt 153 lb 7 oz (69.6 kg)   SpO2 99%   BMI 25.53 kg/m   Physical Exam NAD, alert, complains of pain Abdomen: Ostomy in place.  Dressing dry.  Gastrostomy tube in place.  No IR drains remain.   Imaging: CT ABDOMEN PELVIS W CONTRAST Result Date: 06/14/2024 CLINICAL DATA:  Diarrhea diverting colostomy with anal diarrhea ?enterorectal fistula? EXAM: CT ABDOMEN AND PELVIS WITH CONTRAST TECHNIQUE: Multidetector CT imaging of the abdomen and pelvis was performed using the standard protocol following bolus administration of intravenous contrast. RADIATION DOSE REDUCTION: This exam was performed according to the departmental dose-optimization program which includes automated exposure control, adjustment of the mA and/or kV according to patient size and/or use of iterative reconstruction technique. CONTRAST:  OMNIPAQUE  IOHEXOL  300 MG/ML  SOLN COMPARISON:  06/10/2024 FINDINGS: Lower chest: Moderate bilateral pleural effusions with compressive atelectasis in the lower lobes, unchanged. Hepatobiliary: No focal hepatic abnormality. Gallbladder unremarkable. Pancreas: No focal abnormality or ductal dilatation. Spleen: No focal abnormality.  Normal size. Adrenals/Urinary Tract: Adrenal glands normal. 6.4 cm right lower pole renal cyst is stable. No follow-up imaging recommended. Punctate 1 mm nonobstructing stone in the lower pole of the left kidney. No hydronephrosis. Stomach/Bowel: Right lower quadrant diverting colostomy again noted,  unchanged. Gastrostomy tube in the distal stomach, stable. Small bowel decompressed. Postoperative changes in the region of the rectum. No clearcut enterorectal fistula visualized. Vascular/Lymphatic: Aortic atherosclerosis.  No evidence of aneurysm or adenopathy. IVC filter in place. Reproductive: Prior hysterectomy.  No adnexal masses. Other: Complex fluid collections again noted. Left upper abdominal complex fluid collection measures 7.1 x 5.8 cm compared to 6.6 x 6.0 cm previously. Large mixed density fluid collection containing locules of gas noted anteriorly in the pelvis in the region of prior surgical drain (now removed) measures 11 x 5.3 cm on image 57. High-density fluid collection noted deep to the umbilicus measuring 3.8 x 3.1 cm, similar to prior study. Small amount of free fluid throughout the abdomen and pelvis. Musculoskeletal: No acute bony abnormality. IMPRESSION: Postoperative changes with rectal stump and right lower quadrant transverse diverting colostomy. No well-defined or clear enterorectal fistula. Large mixed density fluid collections again noted in the abdomen and pelvis, possibly from prior hemorrhage. The upper abdominal wall fluid collection and the fluid collection deep to the umbilicus are similar to prior study. Anterior pelvic fluid collection containing air and mixed density fluid has increased in size. This is in the area of prior surgical drain, now removed. Moderate bilateral pleural effusions. Compressive atelectasis in the lower lobes. Electronically Signed   By: Franky Crease M.D.   On: 06/14/2024 15:52   CT GUIDED VISCERAL FLUID DRAIN BY PERC CATH Result Date: 06/12/2024 INDICATION: 83 year old with postoperative fluid collections. Plan for image guided aspiration or drainage. EXAM: CT-guided aspiration and drain placement of abdominal fluid collections MEDICATIONS: Moderate sedation ANESTHESIA/SEDATION: Moderate (conscious) sedation was employed during this procedure. A total of Versed  2mg  and fentanyl  100 mcg was administered intravenously at the order of the provider performing the procedure. Total intra-service moderate sedation time: 60 minutes. Patient's level of consciousness and vital  signs were monitored continuously by radiology nurse throughout the procedure under the supervision of the provider performing the procedure. COMPLICATIONS: None immediate. PROCEDURE: Informed written consent was obtained from the patient after a thorough discussion of the procedural risks, benefits and alternatives. All questions were addressed. Maximal Sterile Barrier Technique was utilized including caps, mask, sterile gowns, sterile gloves, sterile drape, hand hygiene and skin antiseptic. A timeout was performed prior to the initiation of the procedure. Patient was placed supine on CT scanner. Images through the abdomen were obtained without contrast. Evidence for new fluid collections in the anterior abdomen. Therefore, contrast enhanced CT of the abdomen was performed with 75 mL Omnipaque  300. The anterior abdomen was prepped with Betadine. Sterile field was created. Skin was anesthetized using 1% lidocaine . Using CT guidance, the 18 gauge trocar needle was directed into the anterior abdominal cavity and red cloudy fluid was aspirated. Superstiff Amplatz wire was advanced into the collection and wire position was confirmed with CT guidance. The tract was dilated to accommodate a 12 [place] multipurpose drain. The drain was not aspirating well and follow up CT images demonstrated that the drain went beyond the fluid collection. The drain was pulled back into the fluid collection and approximately 50 mL of cloudy red fluid was aspirated. No additional fluid could be obtained from the drain and the drain was not communicating with the other larger collections. Therefore, this drain was cut and completely removed. An area slightly more caudal to the drain site was prepped with chlorhexidine  and anesthetized with 1% lidocaine . 18 gauge needle was directed into this collection CT-guidance but no fluid could be aspirated. Ultrasound was  prepped at this time. Ultrasound demonstrated that the intra-abdominal fluid  collections are severely complex with echogenic material probably representing blood products. Based on ultrasound findings, the needle was removed. Bandages were placed. RADIATION DOSE REDUCTION: This exam was performed according to the departmental dose-optimization program which includes automated exposure control, adjustment of the mA and/or kV according to patient size and/or use of iterative reconstruction technique. FINDINGS: Initial CT images were compared with a CT on 06/10/2024. Compared to the previous CT, there is new complex fluid in the left anterior abdomen containing gas. Again noted is high-density material layering within the fluid collections suggestive for blood products. Contrast enhanced CT was performed in order to better characterize these complex fluid collections. 12 French drain did not track within the larger collections and probably related to the severe loculations. This drain was pulled back into the anterior abdomen and approximately 50 mL of fluid was aspirated from the anterior component. This drain was completely removed. No fluid could be aspirated from the second needle placement. IMPRESSION: 1. Increased air-fluid collections in the left anterior abdomen compared to the exam on 06/10/2024. These fluid collections are very complex with evidence of blood products. 2. CT-guided drain was placed in one of the anterior collections but only 50 mL of fluid could be obtained and the drain was not communicating with the other collections. Therefore, this drain was removed. Fluid was sent for culture. Electronically Signed   By: Juliene Balder M.D.   On: 06/12/2024 08:57    Labs:  CBC: Recent Labs    06/12/24 0433 06/12/24 1851 06/13/24 0359 06/14/24 0233 06/15/24 0414  WBC 12.8*  --  9.5 9.3 8.3  HGB 6.8* 10.0* 9.6* 9.7* 9.9*  HCT 21.7* 31.0* 30.1* 31.5* 32.1*  PLT 348  --  335 360 393    COAGS: Recent Labs    10/05/23 0247 10/11/23 1255 10/12/23 0655 05/19/24 2054  06/04/24 1522 06/05/24 0429 06/11/24 0309  INR 1.1 1.1 1.1 1.2  --   --  1.2  APTT 30  --   --   --  41* 40*  --     BMP: Recent Labs    06/10/24 0400 06/12/24 0433 06/14/24 0233 06/15/24 0414  NA 138 136 134* 134*  K 3.6 3.6 3.9 3.6  CL 108 108 103 102  CO2 23 21* 22 24  GLUCOSE 161* 172* 107* 134*  BUN 29* 35* 21 18  CALCIUM  7.9* 7.8* 8.1* 8.1*  CREATININE 0.40* 0.74 0.60 0.49  GFRNONAA >60 >60 >60 >60    LIVER FUNCTION TESTS: Recent Labs    06/04/24 0609 06/05/24 0830 06/09/24 0406 06/14/24 0233  BILITOT 1.2 1.3* 1.4* 1.2  AST 34 26 22 25   ALT 23 21 21 18   ALKPHOS 129* 146* 196* 231*  PROT 6.2* 6.4* 6.1* 6.3*  ALBUMIN  1.7* 1.7* 1.5* 1.8*    Assessment and Plan: Intra-abdominal fluid collections  Elizabeth Odom is an 83 year old female with post-operative fluid collections  known to IR from prior interventions: -10/12/23 R transgluteal drain >> removed after abscess resolved 11/05/23 -05/21/24 TG drain placed >> inadvertently removed 05/25/24, and anterior drain placement >> repositioned 05/27/24 -05/27/24 TG drain replacement >> removed intra-operatively 06/04/23 exchange/reposition of anterior drain >> removed intra-operatively 06/03/24 7/2 Aspiration of left anterior collection, no drain placed.   IR now consulted for further aspiration and drainages as able for ongoing persistent intra-abdominal fluid.   Case reviewed and attempt approved for 7/7 by Dr. Jenna.  Risks and benefits discussed with the patient including bleeding, infection, damage to adjacent structures, bowel perforation/fistula connection, and sepsis.  All of the patient's questions were answered, patient is agreeable to proceed. Consent signed and in chart.  Electronically Signed: Yeslin Delio Sue-Ellen Whalen Trompeter, PA 06/15/2024, 11:46 AM   I spent a total of 15 Minutes at the the patient's bedside AND on the patient's hospital floor or unit, greater than 50% of which was counseling/coordinating care  for intra-abdominal fluid collections.

## 2024-06-15 NOTE — Progress Notes (Signed)
 PHARMACY - TOTAL PARENTERAL NUTRITION CONSULT NOTE   Indication: Prolonged ileus  Patient Measurements: Height: 5' 5 (165.1 cm) Weight:  (Unable to weight pt due to zeroed bed) IBW/kg (Calculated) : 57 TPN AdjBW (KG): 61.3 Body mass index is 25.53 kg/m.  Assessment: 89 yoF with history of diverticulitis with perforation and abscess. 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 She requested ostomy takedown and underwent LOA, colostomy takedown, small bowel resection with anastomosis on 5/29. Pharmacy is consulted to dose TPN starting 6/2 for postop ileus.  Post-op course complicated by anastomotic leak, multiple-intraabdominal abscesses. Post-op bleeding. Patient is now s/p diverting loop transverse colostomy.  Glucose / Insulin : no Hx DM, A1c 5.1%. BG goal <180 -CBGs q4h remain < 150 -Not on SSI currently  Electrolytes:  Na 134 (previously maxed in TPN). CoCa 9.87, K 3.6, Phos 2.6, Mg 1.9  - 7/5: Gross specified Keep K>4, Mg>2, Phos>3. Intermittent potassium supplementation with diuresis  Hepatic: albumin  remains low 1.8, Alk Phos elevated -TG 174 (6/28) > 205 (6/30)>107 (7/5)   I/O:  - MIVF: NS @kvo  -UOP (foley) :  -Stool: 700 with multiple episodes of watery diarrhea. Place Flexiseal (loperamide  BID, Fiber BID, Lomotil  QID) -Drain:  none noted  GI Imaging:  -6/9 CT: Dempsey dehiscence along the rectal anastomosis, free air and multiple fluid collections -6/16 CT: Similar appearance at the rectal anastomosis concerning for dehiscence -6/23: poss catheter occlusion of PERC drain, several fistulous of sigmoid colon, gas collection, rectal anastomosis with dehiscence cavity communicating to small bowel, Multiple additional small loculated fluid collection at new percutaneous drainage catheter site. Additional small loculated fluid collection small bowel  mesentery.  - 7/1: CT: Multiple complex fluid collections within the upper abdomen  - 7/5: CT: No well-defined or clear enterorectal fistula.  Large mixed density fluid collections again noted in the abdomen and pelvis, possibly from prior hemorrhage. The upper abdominal wall fluid collection and the fluid collection deep to the umbilicus are similar to prior study. Anterior pelvic fluid collection containing air and mixed density fluid has increased in size. This is in the area of prior surgical drain, now removed.   GI Surgeries / Procedures: -5/29: LAR, SBR, colostomy takedown, LOA -6/10 drains placed x 2 in IR (midline abd, transgluteal) -6/17: Replacement of drainage catheter by IR -6/24: diverting loop transverse colostomy, I&D of abdominal abscess, gastrotomy tube placement, rectal tube placement.   Central access: Double lumen PICC placed 6/2 TPN start date: 6/2>7/1, resume 7/4>>  Nutritional Goals: Goal TPN rate is 70 mL/hr (provides 94 g of protein and 1680 kcals per day)  RD Assessment:  Estimated Needs Total Energy Estimated Needs: 1600-1750 kcals Total Protein Estimated Needs: 80-95 grams Total Fluid Estimated Needs: >/= 1.6L  Current Nutrition:  Several attempts with TF. Not tolerating with significant diarrhea. TPN 7/4>> Sips as tolerated  Multiple calorie counts this admission. Not meeting caloric needs. Refusing all supplements. Pt underwent G-tube placement 6/24. Tube feeds started 6/26, stopped for abdominal distention and discomfort.  6/30 Tube feeds started>>stopped 7/3   Plan:  TPN 73ml/hr goal rate. D/c TF. Still not tolerating Sips as tolerated Electrolytes in TPN: No change Na 100 K 65 mEq/L (extra next bag) Ca 5 mEq/L Mg 10 mEq/L (extra 1g next bag) Phos 24 mmol/L (extra next bag) Cl:Ac 1:1 Resume sSSI q6h if CBGs trend up >150 Change IVF to NS at kvo with next bag of TPN  Monitor TPN labs on Mon/Thurs, and as  needed.   Casimir Camelia Ours, PharmD, BCPS Clinical Pharmacist 06/15/2024 8:08 AM

## 2024-06-16 ENCOUNTER — Inpatient Hospital Stay (HOSPITAL_COMMUNITY)

## 2024-06-16 ENCOUNTER — Encounter (HOSPITAL_COMMUNITY): Payer: Self-pay | Admitting: Surgery

## 2024-06-16 DIAGNOSIS — B9689 Other specified bacterial agents as the cause of diseases classified elsewhere: Secondary | ICD-10-CM | POA: Diagnosis not present

## 2024-06-16 DIAGNOSIS — L259 Unspecified contact dermatitis, unspecified cause: Secondary | ICD-10-CM | POA: Diagnosis not present

## 2024-06-16 DIAGNOSIS — D62 Acute posthemorrhagic anemia: Secondary | ICD-10-CM | POA: Diagnosis not present

## 2024-06-16 DIAGNOSIS — M7989 Other specified soft tissue disorders: Secondary | ICD-10-CM | POA: Diagnosis not present

## 2024-06-16 DIAGNOSIS — B9562 Methicillin resistant Staphylococcus aureus infection as the cause of diseases classified elsewhere: Secondary | ICD-10-CM | POA: Diagnosis not present

## 2024-06-16 DIAGNOSIS — K651 Peritoneal abscess: Secondary | ICD-10-CM | POA: Diagnosis not present

## 2024-06-16 DIAGNOSIS — K5790 Diverticulosis of intestine, part unspecified, without perforation or abscess without bleeding: Secondary | ICD-10-CM | POA: Diagnosis not present

## 2024-06-16 DIAGNOSIS — R197 Diarrhea, unspecified: Secondary | ICD-10-CM

## 2024-06-16 DIAGNOSIS — B952 Enterococcus as the cause of diseases classified elsewhere: Secondary | ICD-10-CM | POA: Diagnosis not present

## 2024-06-16 DIAGNOSIS — R21 Rash and other nonspecific skin eruption: Secondary | ICD-10-CM

## 2024-06-16 DIAGNOSIS — I4819 Other persistent atrial fibrillation: Secondary | ICD-10-CM | POA: Diagnosis not present

## 2024-06-16 LAB — AEROBIC/ANAEROBIC CULTURE W GRAM STAIN (SURGICAL/DEEP WOUND)

## 2024-06-16 LAB — BPAM RBC
Blood Product Expiration Date: 202507232359
Blood Product Expiration Date: 202507292359
Blood Product Expiration Date: 202507292359
Blood Product Expiration Date: 202508022359
ISSUE DATE / TIME: 202507021040
ISSUE DATE / TIME: 202507021812
ISSUE DATE / TIME: 202507030603
ISSUE DATE / TIME: 202507031016
Unit Type and Rh: 6200
Unit Type and Rh: 6200
Unit Type and Rh: 6200
Unit Type and Rh: 6200

## 2024-06-16 LAB — CBC
HCT: 33.8 % — ABNORMAL LOW (ref 36.0–46.0)
Hemoglobin: 10.5 g/dL — ABNORMAL LOW (ref 12.0–15.0)
MCH: 28.6 pg (ref 26.0–34.0)
MCHC: 31.1 g/dL (ref 30.0–36.0)
MCV: 92.1 fL (ref 80.0–100.0)
Platelets: 413 K/uL — ABNORMAL HIGH (ref 150–400)
RBC: 3.67 MIL/uL — ABNORMAL LOW (ref 3.87–5.11)
RDW: 18.6 % — ABNORMAL HIGH (ref 11.5–15.5)
WBC: 10.8 K/uL — ABNORMAL HIGH (ref 4.0–10.5)
nRBC: 0 % (ref 0.0–0.2)

## 2024-06-16 LAB — CK: Total CK: 29 U/L — ABNORMAL LOW (ref 38–234)

## 2024-06-16 LAB — COMPREHENSIVE METABOLIC PANEL WITH GFR
ALT: 17 U/L (ref 0–44)
AST: 23 U/L (ref 15–41)
Albumin: 1.9 g/dL — ABNORMAL LOW (ref 3.5–5.0)
Alkaline Phosphatase: 299 U/L — ABNORMAL HIGH (ref 38–126)
Anion gap: 9 (ref 5–15)
BUN: 16 mg/dL (ref 8–23)
CO2: 23 mmol/L (ref 22–32)
Calcium: 8.1 mg/dL — ABNORMAL LOW (ref 8.9–10.3)
Chloride: 101 mmol/L (ref 98–111)
Creatinine, Ser: 0.43 mg/dL — ABNORMAL LOW (ref 0.44–1.00)
GFR, Estimated: >60 mL/min (ref >60)
Glucose, Bld: 119 mg/dL — ABNORMAL HIGH (ref 70–99)
Potassium: 3.8 mmol/L (ref 3.5–5.1)
Sodium: 133 mmol/L — ABNORMAL LOW (ref 135–145)
Total Bilirubin: 1.1 mg/dL (ref 0.0–1.2)
Total Protein: 7 g/dL (ref 6.5–8.1)

## 2024-06-16 LAB — DIFFERENTIAL
Abs Immature Granulocytes: 0.18 K/uL — ABNORMAL HIGH (ref 0.00–0.07)
Basophils Absolute: 0.1 K/uL (ref 0.0–0.1)
Basophils Relative: 1 %
Eosinophils Absolute: 0.3 K/uL (ref 0.0–0.5)
Eosinophils Relative: 3 %
Immature Granulocytes: 2 %
Lymphocytes Relative: 20 %
Lymphs Abs: 2.1 K/uL (ref 0.7–4.0)
Monocytes Absolute: 0.6 K/uL (ref 0.1–1.0)
Monocytes Relative: 6 %
Neutro Abs: 7.2 K/uL (ref 1.7–7.7)
Neutrophils Relative %: 68 %

## 2024-06-16 LAB — MAGNESIUM: Magnesium: 2 mg/dL (ref 1.7–2.4)

## 2024-06-16 LAB — TYPE AND SCREEN
ABO/RH(D): A POS
Antibody Screen: NEGATIVE
Unit division: 0
Unit division: 0
Unit division: 0
Unit division: 0

## 2024-06-16 LAB — GLUCOSE, CAPILLARY
Glucose-Capillary: 126 mg/dL — ABNORMAL HIGH (ref 70–99)
Glucose-Capillary: 127 mg/dL — ABNORMAL HIGH (ref 70–99)
Glucose-Capillary: 142 mg/dL — ABNORMAL HIGH (ref 70–99)
Glucose-Capillary: 148 mg/dL — ABNORMAL HIGH (ref 70–99)

## 2024-06-16 LAB — TRIGLYCERIDES: Triglycerides: 182 mg/dL — ABNORMAL HIGH (ref <150)

## 2024-06-16 LAB — PHOSPHORUS: Phosphorus: 2.4 mg/dL — ABNORMAL LOW (ref 2.5–4.6)

## 2024-06-16 MED ORDER — FENTANYL CITRATE (PF) 100 MCG/2ML IJ SOLN
INTRAMUSCULAR | Status: AC
  Filled 2024-06-16: qty 2

## 2024-06-16 MED ORDER — HYDROXYZINE HCL 25 MG PO TABS
25.0000 mg | ORAL_TABLET | Freq: Three times a day (TID) | ORAL | Status: DC
  Administered 2024-06-16 (×2): 25 mg via ORAL
  Filled 2024-06-16 (×2): qty 1

## 2024-06-16 MED ORDER — TRAVASOL 10 % IV SOLN
INTRAVENOUS | Status: AC
  Filled 2024-06-16: qty 940.8

## 2024-06-16 MED ORDER — K PHOS MONO-SOD PHOS DI & MONO 155-852-130 MG PO TABS
500.0000 mg | ORAL_TABLET | Freq: Two times a day (BID) | ORAL | Status: DC
  Administered 2024-06-16 – 2024-06-17 (×3): 500 mg
  Filled 2024-06-16 (×4): qty 2

## 2024-06-16 MED ORDER — SODIUM CHLORIDE 0.9% FLUSH
5.0000 mL | Freq: Three times a day (TID) | INTRAVENOUS | Status: DC
  Administered 2024-06-16 – 2024-06-25 (×23): 5 mL

## 2024-06-16 MED ORDER — LOPERAMIDE HCL 2 MG PO CAPS
4.0000 mg | ORAL_CAPSULE | Freq: Four times a day (QID) | ORAL | Status: DC
  Administered 2024-06-16 (×2): 4 mg via ORAL
  Filled 2024-06-16 (×2): qty 2

## 2024-06-16 MED ORDER — FERROUS SULFATE 300 (60 FE) MG/5ML PO SOLN
300.0000 mg | Freq: Two times a day (BID) | ORAL | Status: DC
  Filled 2024-06-16: qty 5

## 2024-06-16 MED ORDER — POTASSIUM CHLORIDE 10 MEQ/50ML IV SOLN
10.0000 meq | INTRAVENOUS | Status: AC
  Administered 2024-06-16 (×2): 10 meq via INTRAVENOUS
  Filled 2024-06-16 (×2): qty 50

## 2024-06-16 MED ORDER — MIDAZOLAM HCL 2 MG/2ML IJ SOLN
INTRAMUSCULAR | Status: AC
  Filled 2024-06-16: qty 2

## 2024-06-16 MED ORDER — NALOXONE HCL 0.4 MG/ML IJ SOLN
INTRAMUSCULAR | Status: AC
  Filled 2024-06-16: qty 1

## 2024-06-16 MED ORDER — FENTANYL CITRATE (PF) 100 MCG/2ML IJ SOLN
INTRAMUSCULAR | Status: AC | PRN
  Administered 2024-06-16 (×2): 25 ug via INTRAVENOUS

## 2024-06-16 MED ORDER — FERROUS SULFATE 75 (15 FE) MG/ML PO SOLN
15.0000 mg | Freq: Two times a day (BID) | ORAL | Status: DC
  Administered 2024-06-16 – 2024-06-17 (×2): 15 mg
  Filled 2024-06-16 (×2): qty 1

## 2024-06-16 NOTE — TOC Progression Note (Signed)
 Transition of Care [facility]) - Progression Note    Patient Details  Name: Elizabeth Odom MRN: [id number] Date of Birth: June 26, 1941  [facility] CM/SW Contact  Tawni CHRISTELLA Eva, LCSW Phone Number: 06/16/2024, 12:11 PM  Clinical Narrative:     Pt's chart was reviewed, not medically stable. TOC will continue to follow.    Expected Discharge Plan: (P) Skilled Nursing Facility Barriers to Discharge: (P) Continued Medical Work up, SNF Pending bed offer, Conservator, museum/gallery and Services   Discharge Planning Services: CM Consult   Living arrangements for the past 2 months: Apartment                                       Social Determinants of Health (SDOH) Interventions SDOH Screenings   Food Insecurity: No Food Insecurity (05/09/2024)  Housing: Low Risk  (05/09/2024)  Transportation Needs: No Transportation Needs (05/09/2024)  Utilities: Not At Risk (05/09/2024)  Depression (PHQ2-9): Low Risk  (01/02/2024)  Social Connections: Unknown (05/09/2024)  Tobacco Use: Low Risk  (05/08/2024)    Readmission Risk Interventions    05/12/2024    2:23 PM 05/09/2024    2:46 PM 10/19/2023    6:13 PM  Readmission Risk Prevention Plan  Transportation Screening Complete Complete Complete  PCP or Specialist Appt within 5-7 Days   Complete  PCP or Specialist Appt within 3-5 Days Complete Complete   Home Care Screening   Complete  Medication Review (RN CM)   Complete  HRI or Home Care Consult Complete Complete   Social Work Consult for Recovery Care Planning/Counseling Complete Complete   Palliative Care Screening Not Applicable Not Applicable   Medication Review Oceanographer) Complete Complete

## 2024-06-16 NOTE — Progress Notes (Signed)
 PROGRESS NOTE    Elizabeth Odom  FMW:[id number] DOB: 11/13/1941 DOA: 05/08/2024 PCP: Elizabeth Odom   Brief Narrative: Elizabeth Odom is a 83 y.o. female with a history of atrial fibrillation, DVT, PE, anemia, PACs, hypertension, mild cognitive impairment, large bowel obstruction. Patient initially presented for surgical management involving history of diverticulitis with planned ostomy takedown with anastomosis. During hospitalization, patient developed altered mental status secondary to polypharmacy in addition to atrial fibrillation with RVR. Cardiology consulted for recommendation and management with subsequent improvement in RVR. On 6/24, patient underwent repeat laparoscopy converted to laparotomy with diverting loops transverse colostomy performed, in addition to drainage of an abdominal abscess and gastrostomy/rectal tube placement. Patient required admission to ICU post-operatively for respiratory failure but was extubated successfully on 6/25 and subsequent transferred out of ICU.    Assessment and Plan:  Persistent atrial fibrillation/flutter Cardiology consulted for management.  Medication adjusted to metoprolol  XL 25 mg daily with recommendation for Eliquis  when okay per surgery. Toprol -XL decreased to 12.5 mg daily. Eliquis  held and transitioned to heparin  IV. Heparin  now held secondary to acute anemia with concern for hematoma. -Continue Toprol -XL 12.5 mg daily  History of diverticulitis with perforation and abscess with colostomy status post ostomy takedown and re-do Ileus Per primary.  Infectious diseases consulted and are planning for management with multi week course of antibiotics.  Urine culture (6/12) significant for Pseudomonas, MRSA, Enterococcus faecalis, Candida albicans.  Drain culture (6/17) significant for rare Pseudomonas aeruginosa.  Ongoing antibiotics per infectious disease.  Patient now on daptomycin , micafungin , Zosyn . Patient underwent diverting  colostomy on 6/24 with gastrotomy tube placement. TPN weaned off. Tube feeds started. Tube feeds now held and TPN re-ordered. Repeat CT abdomen/pelvis (7/5) significant for large fluid collections seen from prior CT imaging; IR consulted for drain. -Ongoing general surgery recommendations  Intraabdominal abscess Infectious disease consulted. Culture data is polymicrobial and evident for pseudomonas aeruginosa, staphylococcus aureus and candida albicans. Candida treated with micafungin  and course is completed. Abdominal drain fluid culture (7/2) significant for enterococcus faecalis, clostridium innocuum, MRSA. -ID recommendations: Meropenem , daptomycin   Acute blood loss anemia Initially secondary to surgery, requiring multiple transfusions. Patient with recurrent significant anemia with concern for intraabdominal abscess vs hematoma. Patient then developed blood per rectum requiring multiple transfusions. Patient has received a total of 9 units of PRBC via transfusion. Hemoglobin stable. Will likely need to avoid anticoagulation completely in the future. -Follow-up post-transfusion H&H and transfuse as needed -Ongoing general surgery recommendations  Pleural effusions Moderate in size and bilateral. Currently asymptomatic. -If develops symptoms, will need a thoracentesis  Rectal output Unclear source as patient has a diverting colostomy, however she has minimal output from colostomy. Concern for possible fistula, however no fistula was identified on CT abdomen/pelvis (7/5). -Per general surgery  Contact dermatitis Noted on back. Very symptomatic with itching. -Continue Atarax , triamcinolone , barrier cream  AKI Mild. Resolved.  Hypoalbuminemia Noted. Associated poor nutrition. Patient is on TPN.  Leukocytosis In setting of surgery and infection. Mildly elevated.  Thrombocytosis Noted. Likely reactive. Mildly elevated.  Hyponatremia Mild. Stable.  Syncope Syncopal episode  occurred during hospitalization while patient was on commode.  Concern for possible vasovagal versus hypotension etiology.  Transthoracic echo obtained with evidence of preserved LVEF of 60 to 65% in addition to no aortic stenosis.  Acute urinary retention Foley inserted on 6/4. Flomax  started. Foley removed on 6/13. Flomax  discontinued.  Acute metabolic encephalopathy Delirium Resolved.  History of DVT/PE LLE swelling Patient previously did not  tolerate anticoagulation, requiring the placement of a retrievable IVC filter on 10/05/2023. CT this admission confirms its placement at this time. RLE venous duplex without evidence of acute DVT, but with evidence of chronic DVT involving the left posterior tibial veins and left peroneal veins.  Generalized weakness Physical therapy is ordered already.  Discussed with patient that is important for her to participate as much as possible with therapy to improve her strength.   DVT prophylaxis: SCDs Code Status:   Code Status: Full Code Family Communication: None at bedside Disposition Plan: Discharge pending continued general surgery and ID recommendations. Anticipate discharge to SNF when medically ready.   Consultants:  General surgery PCCM Infectious disease  Procedures:    Antimicrobials: Zosyn  Daptomycin  Micafungin  Meropenem  Vancomycin    Subjective: Patient with persistent itching.  Objective: BP 128/83 (BP Location: Left Arm)   Pulse 79   Temp 98.1 F (36.7 C) (Oral)   Resp 18   Ht 5' 5 (1.651 m)   Wt 69.6 kg   SpO2 100%   BMI 25.53 kg/m   Examination:  General exam: Appears calm and comfortable Respiratory system: Respiratory effort normal. Central nervous system: Alert and oriented.  Psychiatry: Judgement and insight appear normal. Mood & affect appropriate.    Data Reviewed: I have personally reviewed following labs and imaging studies  CBC Lab Results  Component Value Date   WBC 10.8 (H) 06/16/2024    RBC 3.67 (L) 06/16/2024   HGB 10.5 (L) 06/16/2024   HCT 33.8 (L) 06/16/2024   MCV 92.1 06/16/2024   MCH 28.6 06/16/2024   PLT 413 (H) 06/16/2024   MCHC 31.1 06/16/2024   RDW 18.6 (H) 06/16/2024   LYMPHSABS 0.7 05/09/2024   MONOABS 1.8 (H) 05/09/2024   EOSABS 0.0 05/09/2024   BASOSABS 0.0 05/09/2024     Last metabolic panel Lab Results  Component Value Date   NA 133 (L) 06/16/2024   K 3.8 06/16/2024   CL 101 06/16/2024   CO2 23 06/16/2024   BUN 16 06/16/2024   CREATININE 0.43 (L) 06/16/2024   GLUCOSE 119 (H) 06/16/2024   GFRNONAA >60 06/16/2024   GFRAA >60 04/10/2016   CALCIUM  8.1 (L) 06/16/2024   PHOS 2.4 (L) 06/16/2024   PROT 7.0 06/16/2024   ALBUMIN  1.9 (L) 06/16/2024   LABGLOB 2.9 04/08/2024   BILITOT 1.1 06/16/2024   ALKPHOS 299 (H) 06/16/2024   AST 23 06/16/2024   ALT 17 06/16/2024   ANIONGAP 9 06/16/2024    GFR: Estimated Creatinine Clearance: 52.2 mL/min (A) (by C-G formula based on SCr of 0.43 mg/dL (L)).  Recent Results (from the past 240 hours)  Aerobic/Anaerobic Culture w Gram Stain (surgical/deep wound)     Status: None   Collection Time: 06/11/24  4:12 PM   Specimen: Abdomen; Abdominal Fluid  Result Value Ref Range Status   Specimen Description   Final    ABDOMEN Performed at [facility], [address], [place], [place] [address]    Special Requests   Final    ABDOMINAL FLUID Performed at [facility], [address], [place], [place] [address]    Gram Stain   Final    ABUNDANT WBC PRESENT, PREDOMINANTLY PMN ABUNDANT GRAM NEGATIVE RODS FEW GRAM POSITIVE COCCI    Culture   Final    MODERATE PSEUDOMONAS AERUGINOSA Two isolates with different morphologies were identified as the same organism.The most resistant organism was reported. FEW METHICILLIN RESISTANT STAPHYLOCOCCUS AUREUS MODERATE ENTEROCOCCUS FAECALIS MODERATE CLOSTRIDIUM INNOCUUM Standardized  susceptibility testing for this organism is not  available. Performed at [facility], [address] [place], [place] [address]    Report Status 06/16/2024 FINAL  Final   Organism ID, Bacteria PSEUDOMONAS AERUGINOSA  Final   Organism ID, Bacteria ENTEROCOCCUS FAECALIS  Final   Organism ID, Bacteria METHICILLIN RESISTANT STAPHYLOCOCCUS AUREUS  Final      Susceptibility   Enterococcus faecalis - MIC*    AMPICILLIN <=2 SENSITIVE Sensitive     VANCOMYCIN  1 SENSITIVE Sensitive     GENTAMICIN SYNERGY SENSITIVE Sensitive     * MODERATE ENTEROCOCCUS FAECALIS   Methicillin resistant staphylococcus aureus - MIC*    CIPROFLOXACIN >=8 RESISTANT Resistant     ERYTHROMYCIN  >=8 RESISTANT Resistant     GENTAMICIN <=0.5 SENSITIVE Sensitive     OXACILLIN >=4 RESISTANT Resistant     TETRACYCLINE <=1 SENSITIVE Sensitive     VANCOMYCIN  1 SENSITIVE Sensitive     TRIMETH/SULFA >=320 RESISTANT Resistant     CLINDAMYCIN <=0.25 SENSITIVE Sensitive     RIFAMPIN <=0.5 SENSITIVE Sensitive     Inducible Clindamycin NEGATIVE Sensitive     LINEZOLID 2 SENSITIVE Sensitive     * FEW METHICILLIN RESISTANT STAPHYLOCOCCUS AUREUS   Pseudomonas aeruginosa - MIC*    CEFTAZIDIME >=64 RESISTANT Resistant     CIPROFLOXACIN 0.5 SENSITIVE Sensitive     GENTAMICIN 4 SENSITIVE Sensitive     IMIPENEM 2 SENSITIVE Sensitive     * MODERATE PSEUDOMONAS AERUGINOSA      Radiology Studies: VAS US  LOWER EXTREMITY VENOUS (DVT) Result Date: 06/16/2024  Lower Venous DVT Study Patient Name:  CONCETTA GUION  Date of Exam:   06/16/2024 Medical Rec #: [id number]       Accession #:    [id number] Date of Birth: [birth year]      Patient Gender: F Patient Age:   28 years Exam Location:  [facility] Procedure:      VAS US  LOWER EXTREMITY VENOUS (DVT) Referring Phys: Jaia Alonge --------------------------------------------------------------------------------  Indications: Edema. Extensive hospital stay, intolerant to anticoagulants. Other Indications: Large bowel obstruction, status  post multiple abdominal                    surgeries, including colostomy and colostomy reversal. Risk Factors: DVT 10/04/23 left popliteal, posterior tibial, peroneal, and gastroc veins History of PE. Patient had IVC filter placement 10/05/23 by IR. Atrial fibrillation. Comparison Study: Prior bilateral LEV done 10/04/2023 Performing Technologist: Alberta Lis RVS  Examination Guidelines: A complete evaluation includes B-mode imaging, spectral Doppler, color Doppler, and power Doppler as needed of all accessible portions of each vessel. Bilateral testing is considered an integral part of a complete examination. Limited examinations for reoccurring indications may be performed as noted. The reflux portion of the exam is performed with the patient in reverse Trendelenburg.  +---------+---------------+---------+-----------+----------+--------------+ RIGHT    CompressibilityPhasicitySpontaneityPropertiesThrombus Aging +---------+---------------+---------+-----------+----------+--------------+ CFV      Full           Yes      Yes                                 +---------+---------------+---------+-----------+----------+--------------+ SFJ      Full                                                        +---------+---------------+---------+-----------+----------+--------------+  FV Prox  Full           Yes      Yes                                 +---------+---------------+---------+-----------+----------+--------------+ FV Mid   Full           Yes      Yes                                 +---------+---------------+---------+-----------+----------+--------------+ FV DistalFull                                                        +---------+---------------+---------+-----------+----------+--------------+ PFV      Full           Yes      Yes                                 +---------+---------------+---------+-----------+----------+--------------+ POP      Full            Yes      Yes                                 +---------+---------------+---------+-----------+----------+--------------+ PTV      Full                                                        +---------+---------------+---------+-----------+----------+--------------+ PERO     Full                                                        +---------+---------------+---------+-----------+----------+--------------+ Soleal   Full                                                        +---------+---------------+---------+-----------+----------+--------------+ Gastroc  Full                                                        +---------+---------------+---------+-----------+----------+--------------+ SSV      Full                                                        +---------+---------------+---------+-----------+----------+--------------+   +---------+---------------+---------+-----------+----------+--------------+ LEFT     CompressibilityPhasicitySpontaneityPropertiesThrombus Aging +---------+---------------+---------+-----------+----------+--------------+  CFV      Full           Yes      Yes                                 +---------+---------------+---------+-----------+----------+--------------+ SFJ      Full                                                        +---------+---------------+---------+-----------+----------+--------------+ FV Prox  Full                                                        +---------+---------------+---------+-----------+----------+--------------+ FV Mid   Full           Yes      Yes                                 +---------+---------------+---------+-----------+----------+--------------+ FV DistalFull           Yes      No                                  +---------+---------------+---------+-----------+----------+--------------+ PFV      Full                                                         +---------+---------------+---------+-----------+----------+--------------+ POP      Full           Yes      Yes                                 +---------+---------------+---------+-----------+----------+--------------+ PTV      Partial                                                     +---------+---------------+---------+-----------+----------+--------------+ PERO     Partial                                      Chronic        +---------+---------------+---------+-----------+----------+--------------+ Soleal                                                Chronic        +---------+---------------+---------+-----------+----------+--------------+ Gastroc  Full                                                        +---------+---------------+---------+-----------+----------+--------------+  Summary: RIGHT: - There is no evidence of deep vein thrombosis in the lower extremity.  - No cystic structure found in the popliteal fossa. interstitial edema noted in popliteal fossa and throughout calf  LEFT: - Findings consistent with chronic deep vein thrombosis involving the left posterior tibial veins, and left peroneal veins.  - There is no evidence of acute deep vein thrombosis in the lower extremity.  Interstitial edema noted in popliteal fossa and throughout calf.  *See table(s) above for measurements and observations.    Preliminary         LOS: 39 days    Elgin Lam, Odom [facility] 06/16/2024, 2:57 PM   If 7PM-7AM, please contact night-coverage [email or web address]

## 2024-06-16 NOTE — Progress Notes (Addendum)
 [facility]    Date of Admission:  05/08/2024      ID: Elizabeth Odom is a 83 y.o. female with   Principal Problem:   Diverticular disease Active Problems:   A-fib (HCC)   Restless leg   ABLA (acute blood loss anemia)   Need for emotional support   Acute urinary retention   Mixed hyperlipidemia   Essential hypertension   Diverticular disease of left colon   History of DVT (deep vein thrombosis)   History of pulmonary embolus (PE)   Pre-diabetes   Presence of IVC filter   Stricture of sigmoid colon (HCC)   Protein-calorie malnutrition, severe   Hypokalemia   S/P percutaneous endoscopic gastrostomy (PEG) tube placement (HCC)   Goals of care, counseling/discussion   Shock, during or resulting from a surgical procedure    Subjective: Afebrile, but feeling poorly, with abdominal pain. No on TPN and bowel rest. Awaiting for IR to place drain into abdominal fluid collection  She underwent CT on 7/5 showing: LU abd complex fluid collection (slightly increased) 7 x 6cm, Large mixed density anterior pelvis measugin 11 x 5 cm High density fluid collection by umbilicus 4 x 3 cm.  Medications:   acetaminophen   1,000 mg Per Tube Q6H   amLODipine   5 mg Per Tube QHS   vitamin C   500 mg Per Tube Daily   Chlorhexidine  Gluconate Cloth  6 each Topical Daily   diphenoxylate -atropine   2 tablet Per Tube QID   ferrous sulfate   15 mg of iron  Per Tube BID   liver oil-zinc  oxide   Topical BID   loperamide   4 mg Oral QID   loratadine   10 mg Oral Daily   metoprolol  tartrate  12.5 mg Per Tube BID   nystatin    Topical BID   mouth rinse  15 mL Mouth Rinse 4 times per day   oxyCODONE   10 mg Per Tube QHS   pantoprazole   40 mg Oral Q1200   phosphorus  500 mg Per Tube BID   polycarbophil  625 mg Per Tube BID   sodium chloride  flush  10-40 mL Intracatheter Q12H   sodium chloride  flush  3 mL Intravenous Q12H   sodium chloride  flush  5 mL Intracatheter Q8H    triamcinolone  cream   Topical BID    Objective: Vital signs in last 24 hours: Temp:  [97.7 F (36.5 C)-98.6 F (37 C)] 98.1 F (36.7 C) (07/07 1343) Pulse Rate:  [73-101] 79 (07/07 1343) Resp:  [18-95] 18 (07/07 1343) BP: (112-158)/(62-108) 128/83 (07/07 1343) SpO2:  [100 %] 100 % (07/07 1343)  Physical Exam  Constitutional:  oriented to person, place, and time. appears chronically and under-nourished. No distress.  HENT: [place]/AT, PERRLA, no scleral icterus Mouth/Throat: Oropharynx is clear and moist. No oropharyngeal exudate.  Cardiovascular: Normal rate, regular rhythm and normal heart sounds. Exam reveals no gallop and no friction rub.  No murmur heard.  Pulmonary/Chest: Effort normal and breath sounds normal. No respiratory distress.  has no wheezes.  Neck = supple, no nuchal rigidity Abdominal: Soft. Bowel sounds are decreased. Scant brown fluid in ostomy Lymphadenopathy: no cervical adenopathy. No axillary adenopathy Neurological: alert and oriented to person, place, and time.  Skin: Skin is warm and dry. No rash noted. No erythema.  Psychiatric: a normal mood and affect.  behavior is normal.    Lab Results Recent Labs    06/15/24 0414 06/16/24 0335  WBC 8.3 10.8*  HGB 9.9*  10.5*  HCT 32.1* 33.8*  NA 134* 133*  K 3.6 3.8  CL 102 101  CO2 24 23  BUN 18 16  CREATININE 0.49 0.43*   Liver Panel Recent Labs    06/14/24 0233 06/16/24 0335  PROT 6.3* 7.0  ALBUMIN  1.8* 1.9*  AST 25 23  ALT 18 17  ALKPHOS 231* 299*  BILITOT 1.2 1.1   Sedimentation Rate No results for input(s): ESRSEDRATE in the last 72 hours. C-Reactive Protein No results for input(s): CRP in the last 72 hours.  Microbiology: 7/2 showed  PsA- ceftaz R Enterococcus faecalis-amp S Clostridium innocuum MRSA Studies/Results: VAS US  LOWER EXTREMITY VENOUS (DVT) Result Date: 06/16/2024  Lower Venous DVT Study Patient Name:  Elizabeth Odom  Date of Exam:   06/16/2024 Medical Rec #: [id number]        Accession #:    [id number] Date of Birth: 1940/12/30      Patient Gender: F Patient Age:   83 years Exam Location:  [facility] Procedure:      VAS US  LOWER EXTREMITY VENOUS (DVT) Referring Phys: RALPH NETTEY --------------------------------------------------------------------------------  Indications: Edema. Extensive hospital stay, intolerant to anticoagulants. Other Indications: Large bowel obstruction, status post multiple abdominal                    surgeries, including colostomy and colostomy reversal. Risk Factors: DVT 10/04/23 left popliteal, posterior tibial, peroneal, and gastroc veins History of PE. Patient had IVC filter placement 10/05/23 by IR. Atrial fibrillation. Comparison Study: Prior bilateral LEV done 10/04/2023 Performing Technologist: Alberta Lis RVS  Examination Guidelines: A complete evaluation includes B-mode imaging, spectral Doppler, color Doppler, and power Doppler as needed of all accessible portions of each vessel. Bilateral testing is considered an integral part of a complete examination. Limited examinations for reoccurring indications may be performed as noted. The reflux portion of the exam is performed with the patient in reverse Trendelenburg.  +---------+---------------+---------+-----------+----------+--------------+ RIGHT    CompressibilityPhasicitySpontaneityPropertiesThrombus Aging +---------+---------------+---------+-----------+----------+--------------+ CFV      Full           Yes      Yes                                 +---------+---------------+---------+-----------+----------+--------------+ SFJ      Full                                                        +---------+---------------+---------+-----------+----------+--------------+ FV Prox  Full           Yes      Yes                                 +---------+---------------+---------+-----------+----------+--------------+ FV Mid   Full           Yes      Yes                                  +---------+---------------+---------+-----------+----------+--------------+ FV DistalFull                                                        +---------+---------------+---------+-----------+----------+--------------+  PFV      Full           Yes      Yes                                 +---------+---------------+---------+-----------+----------+--------------+ POP      Full           Yes      Yes                                 +---------+---------------+---------+-----------+----------+--------------+ PTV      Full                                                        +---------+---------------+---------+-----------+----------+--------------+ PERO     Full                                                        +---------+---------------+---------+-----------+----------+--------------+ Soleal   Full                                                        +---------+---------------+---------+-----------+----------+--------------+ Gastroc  Full                                                        +---------+---------------+---------+-----------+----------+--------------+ SSV      Full                                                        +---------+---------------+---------+-----------+----------+--------------+   +---------+---------------+---------+-----------+----------+--------------+ LEFT     CompressibilityPhasicitySpontaneityPropertiesThrombus Aging +---------+---------------+---------+-----------+----------+--------------+ CFV      Full           Yes      Yes                                 +---------+---------------+---------+-----------+----------+--------------+ SFJ      Full                                                        +---------+---------------+---------+-----------+----------+--------------+ FV Prox  Full                                                         +---------+---------------+---------+-----------+----------+--------------+  FV Mid   Full           Yes      Yes                                 +---------+---------------+---------+-----------+----------+--------------+ FV DistalFull           Yes      No                                  +---------+---------------+---------+-----------+----------+--------------+ PFV      Full                                                        +---------+---------------+---------+-----------+----------+--------------+ POP      Full           Yes      Yes                                 +---------+---------------+---------+-----------+----------+--------------+ PTV      Partial                                                     +---------+---------------+---------+-----------+----------+--------------+ PERO     Partial                                      Chronic        +---------+---------------+---------+-----------+----------+--------------+ Soleal                                                Chronic        +---------+---------------+---------+-----------+----------+--------------+ Gastroc  Full                                                        +---------+---------------+---------+-----------+----------+--------------+     Summary: RIGHT: - There is no evidence of deep vein thrombosis in the lower extremity.  - No cystic structure found in the popliteal fossa. interstitial edema noted in popliteal fossa and throughout calf  LEFT: - Findings consistent with chronic deep vein thrombosis involving the left posterior tibial veins, and left peroneal veins.  - There is no evidence of acute deep vein thrombosis in the lower extremity.  Interstitial edema noted in popliteal fossa and throughout calf.  *See table(s) above for measurements and observations.    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 Ultimately having IR drain the 2 larger fluid collection would be the best option if drain  can position properly- to help with source control  Pruritic rash to back = does not appear diffuse as one would see with drug rash. Agree with h2 blocker, and schedule hydroxyzine  in the meantime to see if that helps symptoms. Continue with barrier cream  Malnutrition = due to concern for ileorectal fistula, transition to TPN while giving bowel rest  Diarrhea = continue on loperamide  QID  Overall prognosis is guarded  Continue on contact isolation for MRSA  evaluation of this patient requires complex antimicrobial therapy evaluation and counseling and isolation needs for disease transmission risk assessment and mitigation.    [facility] Pager: [phone]  06/16/2024, 2:46 PM

## 2024-06-16 NOTE — Progress Notes (Signed)
 VASCULAR LAB    Bilateral lower extremity venous duplex has been performed.  See CV proc for preliminary results.   Reisa Coppola, RVT 06/16/2024, 9:55 AM

## 2024-06-16 NOTE — Progress Notes (Signed)
  Daily Progress Note   Patient Name: Elizabeth Odom       Date: 06/16/2024 DOB: 1941-02-10  Age: 83 y.o. MRN#: [id number] Attending Physician: Elizabeth Odom LABOR, MD Primary Care Physician: Elizabeth Bernardino MATSU, MD Admit Date: 05/08/2024 Length of Stay: 39 days  Reason for Consultation/Follow-up: Establishing goals of care  Subjective:   Reviewed recent EMR documentation as well as surgery documentation  Patient resting in bed, appears chronically ill.  Vital Signs:  BP (!) 151/108 (BP Location: Left Wrist)   Pulse 73   Temp (P) 98.6 F (37 C) (Oral)   Resp 20   Ht 5' 5 (1.651 m)   Wt 69.6 kg   SpO2 100%   BMI 25.53 kg/m   Physical Exam: General: Sleeping comfortably cachectic, frail Cardiovascular: RRR Respiratory: no increased work of breathing noted, not in respiratory distress Neuro: Sleeping comfortably  Assessment & Plan:   Assessment: Patient is an 83 year old female with a past medical history of DVT, PE, atrial fibrillation, hypertension, mild cognitive impairment, and large bowel obstruction status post colonic resection and colostomy creation in 2020 for which has been complicated by anastomotic leak.  Patient presented last month for colostomy resection and takedown but had complicated postop course with dense adhesions, A-fib with RVR, intra-abdominal abscess, and poor oral intake requiring support of TPN and PEG placement.  Palliative medicine team consulted to assist with complex medical decision making.  Recommendations/Plan: # Complex medical decision making/goals of care:  - Surgery note reviewed, palliative team on standby, available to be of further support this week, depending on hospital course. Grand daughter [facility] familiar with various members of the palliative team, including this Clinical research associate.  Note palliative medicine team will continue to engage in conversations as able and appropriate.  -  Code Status: Full Code    Will revisit full code versus DNR DNI  discussion, based on hospital course this week.  # Symptom management: Surgical team managing patient's postop pain. Agree. Medication history noted.   # Psychosocial support  - Already consulted chaplain to assist with spiritual support. Appreciate spiritual care.   # Discharge Planning: To Be Determined  Thank you for allowing the palliative care team to participate in the care Elizabeth Odom.  Low MDM Elizabeth Serve MD.  Palliative Care Provider PMT # [phone]  If patient remains symptomatic despite maximum doses, please call PMT at [phone] between 0700 and 1900. Outside of these hours, please call attending, as PMT does not have night coverage.

## 2024-06-16 NOTE — Progress Notes (Addendum)
 06/16/2024  Elizabeth Odom [id number] 08-22-1941  CARE TEAM: PCP: Jesus Bernardino MATSU, MD  Outpatient Care Team: Patient Care Team: Jesus Bernardino MATSU, MD as PCP - General (Internal Medicine) [name], MD as Consulting Physician (Gastroenterology) [name], MD (Neurology) [name], MD as Consulting Physician (Urology) [name], MD as Consulting Physician (Cardiology) [name], MD as Consulting Physician (Colon and Rectal Surgery)  Inpatient Treatment Team: Treatment Team:  [name], MD [name], RN [name], MD Bobbette Clause, MD [name], A, OT [name], MD Erlenmeyer, Bernardino CHRISTELLA, RN Burns, Alan BROCKS, NT Brimfield, Dwha W, NT Heiss, Burnard LABOR, NT Lynwood Setter, RPH Mays, Diamone A, RN   Problem List:   Principal Problem:   Diverticular disease Active Problems:   A-fib [facility])   Restless leg   ABLA (acute blood loss anemia)   Need for emotional support   Acute urinary retention   Mixed hyperlipidemia   Essential hypertension   Diverticular disease of left colon   History of DVT (deep vein thrombosis)   History of pulmonary embolus (PE)   Pre-diabetes   Presence of IVC filter   Stricture of sigmoid colon (HCC)   Protein-calorie malnutrition, severe   Hypokalemia   S/P percutaneous endoscopic gastrostomy (PEG) tube placement [facility])   Goals of care, counseling/discussion   Shock, during or resulting from a surgical procedure   05/08/2024  POST-OPERATIVE DIAGNOSIS:   COLOSTOMY FOR RESECTION, DESIRE FOR OSTOMY TAKEDOWN RECTAL STRICTURE HISTORY OF DIVERTICULITIS WITH PERFORATION & ABSCESS   PROCEDURE:   -ROBOTIC RECTOSIGMOID RESECTION (LAR) -TAKEDOWN OF END COLOSTOMY WITH ANASTOMOSIS -RESECTION OF SMALL INTESTINE WITH ANASTOMOSIS -SMALL BOWEL REPAIR -LYSIS OF ADHESIONS x 115 MINUTES (66% OF CASE),  -INTRAOPERATIVE ASSESSMENT OF TISSUE VASCULAR PERFUSION USING ICG (indocyanine green ) IMMUNOFLUORESCENCE,  -TRANSVERSUS  ABDOMINIS PLANE (TAP) BLOCK - BILATERAL -FLEXIBLE SIGMOIDOSCOPY   SURGEON:  [name], MD   05/20/2024  Post procedural Dx: Post op abscess, multiple   Technically successful CT guided placed of a  10 Fr drainage catheter placement x 2  into the midline ventral and dorsal perirectal abscesses.     [name] LABOR, MD    05/28/2024  IMPRESSION: Satisfactory removal of the midline anterior abscess drainage catheter and placement of a new midline anterior pelvic drainage catheter as well as a posterior transgluteal drainage catheter in pelvic abscesses.     Electronically Signed   By: [name]    06/03/2024  POST-OPERATIVE DIAGNOSIS:   DELAYED COLORECTAL ANASTOMOTIC LEAK, NEED FOR FECAL DIVERSION CHRONIC PELVIC ABSCESS   PROCEDURE:   DIAGNOSTIC LAPAROSCOPY CONVERTED TO LAPAROTOMY DIVERTING LOOP TRANSVERSE COLOSTOMY DRAINAGE OF ABDOMINAL ABSCESS GASTROSTOMY TUBE PLACEMENT RECTAL TUBE PLACEMENT   SURGEON:  [name], MD   OR FINDINGS: Very dense concrete intraperitoneal and pelvic adhesions infraumbilically.  Bladder, small bowel, omentum walling off the lower pelvis with delayed anastomotic leak.   Near disruption of colorectal anastomosis by transanal examination.  Rectal tube placed through rectal stump to drain the pelvis more directly.   Diverting mid-transverse loop colostomy done.   Removal of old percutaneous abdominal and transgluteal drains.     Assessment [facility] Stay = 39 days) 13 Days Post-Op    Failure to thrive with delayed anastomotic leaks     Plan:  Delayed anastomotic leaks   Delayed colorectal anastomotic leak initially controlled with bowel rest and drains since patient declined colostomy diversion.    Then drain fell out & felt worse despite  drain replacement.    Acquiesced to diverting loop transverse colostomy (lower abdomen extremely fixed).  6/25 with very fixed abdomen.    Transanal diarrhea started  7/2.  Concern by me that healing anastomosis is leaking into the rectal stump and she has an internal ileorectal fistula.  Trying to control diarrhea with 4 times daily Lomotil .  Increase loperamide  to 4 times daily.  Sips but leave gastrostomy tube to gravity.  Full TPN  Severe malnutrition   Back on TPN for now.  Infection:  -Zosyn  only given Pseudomonas and Clostridium inoculum.  Usually metronidazole  is redundant but will defer to infectious disease.  See what new drain placement will show and is probably infected or chronically irritated hematoma   CT scan 7/5 shows left upper fluid collection larger without more significant fluid component.  I ordered a drain on Saturday in the hopes that get done Sunday.  Has not happened.  SHE NEEDS A DRAIN today 7/7   Concerned that there may be a right-sided fluid collection but looks like mostly giant kidney cyst and some ascites around the liver.  See if any radiology wishes to try and sample and leave drain on right side as well.  Midline wound superficial enough to do just packing for now   C/o Leg itching/weakness.  No evid of rash nor weakness.  Topical steroids.  Add enteral histamine blocker.  I would like to hold off on IV steroids.  Main complaint seems to be perianal.  Defer to need for Duplex w TRH/medicine.  Pt w IVC filter already & significant probs w anticoagulation.    Some recurrent atrial fibrillation and rapid rate.    PO/Gtube metoprolol .  Add back amlodipine  since starting to get hypertensive again.  See if medicine agrees.  I think medicine placed back on telemetry.  Do not feel it is needed   Will defer back to them.  Acute blood loss anemia in the setting of anemia of chronic disease.    Transfused 6/26, 7/2.  Hemoglobin improved from 10-9.6 this morning.  Follow.    Suspect she has rectus sheath hematoma bleeding on heparin .  That is held.  Hemoglobin more stable currently reassuring.  CAT scan with more liquid  hematoma collection.  I think this needs to be drained see if intervention neurology will be more successful and will leave a drain  I think she has failed full anticoagulation again.  I would be very hesitant to restart full anticoagulation w heparin  ever again given her numerous severe bleeding episodes on that despite her history of PE/DVT (has IVC filter as a result of prior bleeding problems) and chronic A-fib.  Perhaps can consider prophylactic heparin  or enoxaparin  if hemoglobin stable and no procedures for 48 hours.  See what internal medicine thinks.  No easy solutions here  No evidence of coagulopathy or thrombocytopenia.  Of note, she had a prior workup and follow-up that did not show any coagulopathy by Dr. Lanny with hematology.  May reconsult if persistent oozing/bleeding  IV iron .    Enteral iron  with vitamin C  6/30.  I would not trust enteral route to be totally adequate but may help  History of urinary retention.  Will keep Foley catheter for now, especially in light with rectal drainage to keep the skin clear.    -monitor electrolytes & replace as needed.  Keep K>4, Mg>2, Phos>3.  Replace phosphorus.  Pharmacy following and most likely will adjust TPN as well  -Diuresis as tolerated.  Trying  to get down to her presurgical weight (~62kg)  if possible.  Follow-up with anemia needing transfusion.  -Pain control.  Scheduled Tylenol .  More alert so I think we can increase her oxycodone  dose since she struggles with pain.  10 mg at night to help her sleep.    VTE prophylaxis.  Patient had severe bleeding issues on anticoagulation numerous hospitalizations.  Would avoid.  Trying to SCDs.  Already has IVC filter in place for prior PE/DVTs.  Patient has some leg swelling -standard of care is considering duplex of lower extremities but will defer to medicine.  Very guarded on doing anticoagulation on her yet again numerous bleeding complications she has had  -mobilize as tolerated to help  recovery.  Try and get therapists involved to help her mobilize more.  Biggest challenge.    I updated the patient's status to the patient and nurse.  Called discussed with the patient's granddaughter who closely follows her 170/1.  Recommendations were made.  Questions were answered.  They expressed understanding & appreciation.  -Disposition: TBD.   There is no way she can go straight home.  She will need skilled nursing facility and rehab in the past.    Agree with palliative care.  I do not think we have a final point of futility but options are limited - neither I nor granddaughter desire prolonged suffering either.    See if we can turn things around in this coming week.     I reviewed last 24 h vitals and pain scores, last 48 h intake and output, last 24 h labs and trends, and last 24 h imaging results.  I have reviewed this patient's available data, including medical history, events of note, test results, etc as part of my evaluation.   A significant portion of that time was spent in counseling. Care during the described time interval was provided by me.  This care required moderate level of medical decision making.  06/16/2024    Subjective: (Chief complaint)  Drain not done yesterday  Patient trying to rest.  No major events.    Objective:  Vital signs:  Vitals:   06/15/24 0440 06/15/24 1354 06/15/24 1929 06/16/24 0356  BP: (!) 123/90 (!) 145/87 (!) 137/94 (!) 151/108  Pulse: 94 94 (!) 101 73  Resp: 18 20 20 20   Temp: 98.3 F (36.8 C) 98.5 F (36.9 C) 97.7 F (36.5 C) 98.2 F (36.8 C)  TempSrc: Oral Oral  Oral  SpO2: 99% 100% 100% 100%  Weight:      Height:        Last BM Date : 06/15/24  Intake/Output   Yesterday:  07/06 0701 - 07/07 0700 In: 2153.2 [P.O.:120; I.V.:1661.5; IV Piggyback:371.7] Out: 2500 [Urine:1600; Drains:400; Stool:500] This shift:  No intake/output data recorded.   Bowel function:  Flatus: YES  BM:  YES -liquid effluent.   Clot in rectal tube given leaking and poor continence  Drain:  Gastrostomy tube with thin bilious effluent and gravity bag Foley catheter with clear yellow urine Rectal tube with thin green bilious effluent   Physical Exam:  General: More tired and sleepy this morning.             Eyes: PERRL, normal EOM.  Sclera clear.  No icterus Neuro: CN II-XII intact w/o focal sensory/motor deficits.  No facial droop. Lymph: No head/neck/groin lymphadenopathy Psych:  No delerium/psychosis/paranoia.  Oriented x 4 HENT: Normocephalic, Mucus membranes moist.  No thrush.  Mild HOH Neck: Supple, No tracheal  deviation.  No obvious thyromegaly.  Chest: No pain to chest wall compression.  Good respiratory excursion.  Mild audible wheezing CV:  Pulses intact.  Regular rhythm.  No major extremity edema MS: Normal AROM mjr joints.  No obvious deformity  Abdomen:  Most of abdomen soft but left upper quadrant with firmness consistent with rectus sheath abdominal wall hematoma.  Mildy distended.  Mildly tender at incisions only.  No guarding.   Right upper quadrant mid transverse diverting loop colostomy pink with edema.  No gas nor stool Midline incision with some granulation. superificial Gastrostomy tube left upper quadrant without any leaking   GU: Foley in place with light yellow clear urine  Rectal: Flexi-Seal tube  - thin brown effluent Ext:   No deformity.  No mjr edema.  No cyanosis Skin: No petechiae / purpurea.  No major sores.  Warm and dry    Results:   Cultures: Recent Results (from the past 720 hours)  MIC (1 Drug)-Abdominal drain abscess; 05/22/2024; Abdomen; MRSA; Daptomycin      Status: Abnormal   Collection Time: 05/22/24  1:54 PM   Specimen: Abdomen  Result Value Ref Range Status   Min Inhibitory Conc (1 Drug) Final report (A)  Corrected    Comment: (NOTE) Performed At: [facility] [address] [place], [place] [id number] Jennette Shorter MD Ey:[id number] CORRECTED ON  06/24 AT 0836: PREVIOUSLY REPORTED AS Preliminary report    Source ABSCESS  Final    Comment: Performed at [facility], [address], [place], [place] [address]  MIC Result     Status: Abnormal   Collection Time: 05/22/24  1:54 PM  Result Value Ref Range Status   Result 1 (MIC) Comment (A)  Final    Comment: (NOTE) Methicillin - resistant Staphylococcus aureus Identification performed by account, not confirmed by this laboratory. DAPTOMYCIN    <=1 UG/ML = SUSCEPTIBLE Performed At: [facility] [address] [place], [place] [id number] Jennette Shorter MD Ey:[id number]   Aerobic/Anaerobic Culture w Gram Stain (surgical/deep wound)     Status: None   Collection Time: 05/22/24  3:59 PM   Specimen: Abdomen  Result Value Ref Range Status   Specimen Description   Final    ABDOMEN Performed at [facility], [address], [place], [place] [address]    Special Requests   Final    ABDOMINAL DRAIN Performed at [facility], [address] [place], [place] [address]    Gram Stain   Final    ABUNDANT WBC PRESENT, PREDOMINANTLY PMN RARE GRAM POSITIVE COCCI RARE YEAST WITH PSEUDOHYPHAE    Culture   Final    MODERATE PSEUDOMONAS AERUGINOSA MODERATE STAPHYLOCOCCUS AUREUS SUSCEPTIBILITIES PERFORMED ON PREVIOUS CULTURE WITHIN THE LAST 5 DAYS. MODERATE CANDIDA ALBICANS SEE SEPARATE REPORT NO ANAEROBES ISOLATED Performed at [facility], [address], [place], [place] [address]    Report Status 06/04/2024 FINAL  Final   Organism ID, Bacteria PSEUDOMONAS AERUGINOSA  Final      Susceptibility   Pseudomonas aeruginosa - MIC*    CEFTAZIDIME 4 SENSITIVE Sensitive     CIPROFLOXACIN <=0.25 SENSITIVE Sensitive     GENTAMICIN 4 SENSITIVE Sensitive     IMIPENEM 2 SENSITIVE Sensitive     PIP/TAZO <=4 SENSITIVE Sensitive ug/mL    CEFEPIME 2 SENSITIVE Sensitive     * MODERATE PSEUDOMONAS AERUGINOSA  Aerobic/Anaerobic Culture w Gram  Stain (surgical/deep wound)     Status: None   Collection Time: 05/22/24  3:59 PM   Specimen: Back  Result Value Ref Range Status   Specimen Description   Final    BACK Performed at [facility], [address] [place], [place] [address]    Special Requests   Final    TRANSGLUTEAL PERCUTANEOUS IR DRAIN Performed at [facility], [address] [place], [place] [address]    Gram Stain   Final    NO WBC SEEN ABUNDANT GRAM NEGATIVE RODS RARE GRAM POSITIVE COCCI IN PAIRS RARE BUDDING YEAST SEEN    Culture   Final    ABUNDANT PSEUDOMONAS AERUGINOSA ABUNDANT METHICILLIN RESISTANT STAPHYLOCOCCUS AUREUS ABUNDANT ENTEROCOCCUS FAECALIS ABUNDANT CANDIDA ALBICANS NO ANAEROBES ISOLATED Sent to [facility] for further susceptibility testing. Performed at [facility], [address] [place], [place] [address]    Report Status 05/27/2024 FINAL  Final   Organism ID, Bacteria PSEUDOMONAS AERUGINOSA  Final   Organism ID, Bacteria METHICILLIN RESISTANT STAPHYLOCOCCUS AUREUS  Final   Organism ID, Bacteria ENTEROCOCCUS FAECALIS  Final      Susceptibility   Enterococcus faecalis - MIC*    AMPICILLIN <=2 SENSITIVE Sensitive     VANCOMYCIN  1 SENSITIVE Sensitive     GENTAMICIN SYNERGY SENSITIVE Sensitive     * ABUNDANT ENTEROCOCCUS FAECALIS   Methicillin resistant staphylococcus aureus - MIC*    CIPROFLOXACIN >=8 RESISTANT Resistant     ERYTHROMYCIN  >=8 RESISTANT Resistant     GENTAMICIN <=0.5 SENSITIVE Sensitive     OXACILLIN >=4 RESISTANT Resistant     TETRACYCLINE <=1 SENSITIVE Sensitive     VANCOMYCIN  1 SENSITIVE Sensitive     TRIMETH/SULFA >=320 RESISTANT Resistant     CLINDAMYCIN <=0.25 SENSITIVE Sensitive     RIFAMPIN <=0.5 SENSITIVE Sensitive     Inducible Clindamycin NEGATIVE Sensitive     LINEZOLID 2 SENSITIVE Sensitive     * ABUNDANT METHICILLIN RESISTANT STAPHYLOCOCCUS AUREUS   Pseudomonas aeruginosa - MIC*    CEFTAZIDIME 4 SENSITIVE  Sensitive     CIPROFLOXACIN <=0.25 SENSITIVE Sensitive     GENTAMICIN 4 SENSITIVE Sensitive     IMIPENEM 2 SENSITIVE Sensitive     PIP/TAZO <=4 SENSITIVE Sensitive ug/mL    CEFEPIME 2 SENSITIVE Sensitive     * ABUNDANT PSEUDOMONAS AERUGINOSA  Yeast Susceptibilities     Status: None   Collection Time: 05/22/24  3:59 PM  Result Value Ref Range Status   SOURCE CANDIDA ALBICAN ABD ABSC  Final    Comment: Performed at [facility], [address], [place], [place] [address]   Organism ID, Yeast Candida albicans  Final    Comment: (NOTE) Identification performed by account, not confirmed by this laboratory.    Amphotericin B MIC 0.25 ug/mL  Final    Comment: (NOTE) Breakpoints have been established for only some organism-drug combinations as indicated. This test was developed and its performance characteristics determined by [facility]. It has not been cleared or approved by the Food and Drug Administration.    Anidulafungin MIC Comment  Final    Comment: (NOTE) 0.03 ug/mL Susceptible Breakpoints have been established for only some organism-drug combinations as indicated. This test was developed and its performance characteristics determined by [facility]. It has not been cleared or approved by the Food and Drug Administration.    Caspofungin MIC Comment  Final    Comment: (NOTE) 0.12 ug/mL Susceptible Breakpoints have been established for only some organism-drug combinations as indicated. This test was developed and its performance characteristics determined by [facility]. It has  not been cleared or approved by the Food and Drug Administration.    Fluconazole  Islt MIC 1.0 ug/mL Susceptible  Final    Comment: (NOTE) Breakpoints have been established for only some organism-drug combinations as indicated. This test was developed and its performance characteristics determined by [facility]. It has not been cleared or approved by the Food and Drug Administration.    ISAVUCONAZOLE  MIC 0.008 ug/mL or less  Final    Comment: (NOTE) This test was developed and its performance characteristics determined by [facility]. It has not been cleared or approved by the Food and Drug Administration.    Itraconazole MIC 0.06 ug/mL  Final    Comment: (NOTE) Breakpoints have been established for only some organism-drug combinations as indicated. This test was developed and its performance characteristics determined by [facility]. It has not been cleared or approved by the Food and Drug Administration.    Micafungin  MIC Comment  Final    Comment: (NOTE) 0.008 ug/mL or less, Susceptible Breakpoints have been established for only some organism-drug combinations as indicated. This test was developed and its performance characteristics determined by [facility]. It has not been cleared or approved by the Food and Drug Administration.    Posaconazole MIC 0.06 ug/mL  Final    Comment: (NOTE) Breakpoints have been established for only some organism-drug combinations as indicated. This test was developed and its performance characteristics determined by [facility]. It has not been cleared or approved by the Food and Drug Administration.    REZAFUNGIN MIC Comment  Final    Comment: (NOTE) 0.15 ug/mL Susceptible This test was developed and its performance characteristics determined by [facility]. It has not been cleared or approved by the Food and Drug Administration.    Voriconazole MIC Comment  Final    Comment: (NOTE) 0.15 ug/mL Susceptible Breakpoints have been established for only some organism-drug combinations as indicated. This test was developed and its performance characteristics determined by [facility]. It has not been cleared or approved by the Food and Drug Administration. Performed At: [facility] [address] [place], [place] [id number] Jennette Shorter MD Ey:[id number]   Aerobic/Anaerobic Culture w Gram Stain (surgical/deep wound)     Status: None   Collection  Time: 05/27/24  5:19 PM   Specimen: Abscess  Result Value Ref Range Status   Specimen Description   Final    ABSCESS ABDOMEN Performed at [facility], [address], [place], [place] [address]    Special Requests   Final    NONE Performed at [facility], [address] [place], [place] [address]    Gram Stain   Final    ABUNDANT WBC PRESENT, PREDOMINANTLY PMN NO ORGANISMS SEEN    Culture   Final    RARE PSEUDOMONAS AERUGINOSA RARE CANDIDA ALBICANS NO ANAEROBES ISOLATED Performed at [facility], [address] [place], [place] [address]    Report Status 06/01/2024 FINAL  Final   Organism ID, Bacteria PSEUDOMONAS AERUGINOSA  Final      Susceptibility   Pseudomonas aeruginosa - MIC*    CEFTAZIDIME 4 SENSITIVE Sensitive     CIPROFLOXACIN <=0.25 SENSITIVE Sensitive     GENTAMICIN 4 SENSITIVE Sensitive     IMIPENEM 2 SENSITIVE Sensitive     PIP/TAZO <=4 SENSITIVE Sensitive ug/mL    CEFEPIME 2 SENSITIVE Sensitive     * RARE PSEUDOMONAS AERUGINOSA  Surgical pcr screen     Status: Abnormal   Collection Time: 06/02/24  7:55 PM  Specimen: Nasal Mucosa; Nasal Swab  Result Value Ref Range Status   MRSA, PCR POSITIVE (A) NEGATIVE Final   Staphylococcus aureus POSITIVE (A) NEGATIVE Final    Comment: (NOTE) The Xpert SA Assay (FDA approved for NASAL specimens in patients 75 years of age and older), is one component of a comprehensive surveillance program. It is not intended to diagnose infection nor to guide or monitor treatment. Performed at [facility], [address], [place], [place] [address]   Aerobic/Anaerobic Culture w Gram Stain (surgical/deep wound)     Status: None (Preliminary result)   Collection Time: 06/11/24  4:12 PM   Specimen: Abdomen; Abdominal Fluid  Result Value Ref Range Status   Specimen Description   Final    ABDOMEN Performed at [facility], [address]  [place], [place] [address]    Special Requests   Final    ABDOMINAL FLUID Performed at [facility], [address] [place], [place] [address]    Gram Stain   Final    ABUNDANT WBC PRESENT, PREDOMINANTLY PMN ABUNDANT GRAM NEGATIVE RODS FEW GRAM POSITIVE COCCI    Culture   Final    MODERATE PSEUDOMONAS AERUGINOSA Two isolates with different morphologies were identified as the same organism.The most resistant organism was reported. FEW STAPHYLOCOCCUS AUREUS MODERATE ENTEROCOCCUS FAECALIS MODERATE CLOSTRIDIUM INNOCUUM Standardized susceptibility testing for this organism is not available. SUSCEPTIBILITIES TO FOLLOW Performed at [facility], [address] [place], [place] [address]    Report Status PENDING  Incomplete   Organism ID, Bacteria PSEUDOMONAS AERUGINOSA  Final   Organism ID, Bacteria ENTEROCOCCUS FAECALIS  Final      Susceptibility   Enterococcus faecalis - MIC*    AMPICILLIN <=2 SENSITIVE Sensitive     VANCOMYCIN  1 SENSITIVE Sensitive     GENTAMICIN SYNERGY SENSITIVE Sensitive     * MODERATE ENTEROCOCCUS FAECALIS   Pseudomonas aeruginosa - MIC*    CEFTAZIDIME >=64 RESISTANT Resistant     CIPROFLOXACIN 0.5 SENSITIVE Sensitive     GENTAMICIN 4 SENSITIVE Sensitive     IMIPENEM 2 SENSITIVE Sensitive     * MODERATE PSEUDOMONAS AERUGINOSA    Labs: Results for orders placed or performed during the hospital encounter of 05/08/24 (from the past 48 hours)  Glucose, capillary     Status: Abnormal   Collection Time: 06/14/24 11:22 AM  Result Value Ref Range   Glucose-Capillary 128 (H) 70 - 99 mg/dL    Comment: Glucose reference range applies only to samples taken after fasting for at least 8 hours.  Glucose, capillary     Status: Abnormal   Collection Time: 06/14/24  8:09 PM  Result Value Ref Range   Glucose-Capillary 142 (H) 70 - 99 mg/dL    Comment: Glucose reference range applies only to samples taken after fasting for at least 8 hours.   Glucose, capillary     Status: Abnormal   Collection Time: 06/14/24 11:44 PM  Result Value Ref Range   Glucose-Capillary 135 (H) 70 - 99 mg/dL    Comment: Glucose reference range applies only to samples taken after fasting for at least 8 hours.  CBC     Status: Abnormal   Collection Time: 06/15/24  4:14 AM  Result Value Ref Range   WBC 8.3 4.0 - 10.5 K/uL   RBC 3.50 (L) 3.87 - 5.11 MIL/uL   Hemoglobin 9.9 (L) 12.0 - 15.0 g/dL   HCT 67.8 (L) 63.9 - 53.9 %  MCV 91.7 80.0 - 100.0 fL   MCH 28.3 26.0 - 34.0 pg   MCHC 30.8 30.0 - 36.0 g/dL   RDW 81.1 (H) 88.4 - 84.4 %   Platelets 393 150 - 400 K/uL   nRBC 0.0 0.0 - 0.2 %    Comment: Performed at [facility] [facility], [address], [place], [place] [address]  Basic metabolic panel with GFR     Status: Abnormal   Collection Time: 06/15/24  4:14 AM  Result Value Ref Range   Sodium 134 (L) 135 - 145 mmol/L   Potassium 3.6 3.5 - 5.1 mmol/L   Chloride 102 98 - 111 mmol/L   CO2 24 22 - 32 mmol/L   Glucose, Bld 134 (H) 70 - 99 mg/dL    Comment: Glucose reference range applies only to samples taken after fasting for at least 8 hours.   BUN 18 8 - 23 mg/dL   Creatinine, Ser 9.50 0.44 - 1.00 mg/dL   Calcium  8.1 (L) 8.9 - 10.3 mg/dL   GFR, Estimated >39 >39 mL/min    Comment: (NOTE) Calculated using the CKD-EPI Creatinine Equation (2021)    Anion gap 8 5 - 15    Comment: Performed at [facility], [address] [place] [address] [place], [place] [address]  Magnesium      Status: None   Collection Time: 06/15/24  4:14 AM  Result Value Ref Range   Magnesium  1.9 1.7 - 2.4 mg/dL    Comment: Performed at [facility], [address] [place], [place] [address]  Phosphorus     Status: None   Collection Time: 06/15/24  4:14 AM  Result Value Ref Range   Phosphorus 2.6 2.5 - 4.6 mg/dL    Comment: Performed at [facility], [address] [place], [place] [address]  Glucose, capillary      Status: Abnormal   Collection Time: 06/15/24  5:44 AM  Result Value Ref Range   Glucose-Capillary 145 (H) 70 - 99 mg/dL    Comment: Glucose reference range applies only to samples taken after fasting for at least 8 hours.  Glucose, capillary     Status: Abnormal   Collection Time: 06/15/24 11:51 AM  Result Value Ref Range   Glucose-Capillary 131 (H) 70 - 99 mg/dL    Comment: Glucose reference range applies only to samples taken after fasting for at least 8 hours.  Glucose, capillary     Status: Abnormal   Collection Time: 06/15/24  4:23 PM  Result Value Ref Range   Glucose-Capillary 145 (H) 70 - 99 mg/dL    Comment: Glucose reference range applies only to samples taken after fasting for at least 8 hours.  Glucose, capillary     Status: Abnormal   Collection Time: 06/15/24  6:08 PM  Result Value Ref Range   Glucose-Capillary 124 (H) 70 - 99 mg/dL    Comment: Glucose reference range applies only to samples taken after fasting for at least 8 hours.  Glucose, capillary     Status: Abnormal   Collection Time: 06/15/24  7:26 PM  Result Value Ref Range   Glucose-Capillary 115 (H) 70 - 99 mg/dL    Comment: Glucose reference range applies only to samples taken after fasting for at least 8 hours.  Glucose, capillary     Status: Abnormal   Collection Time: 06/15/24 11:18 PM  Result Value Ref Range   Glucose-Capillary 132 (H) 70 - 99 mg/dL    Comment: Glucose reference  range applies only to samples taken after fasting for at least 8 hours.  Glucose, capillary     Status: Abnormal   Collection Time: 06/16/24  3:23 AM  Result Value Ref Range   Glucose-Capillary 127 (H) 70 - 99 mg/dL    Comment: Glucose reference range applies only to samples taken after fasting for at least 8 hours.  CK     Status: Abnormal   Collection Time: 06/16/24  3:35 AM  Result Value Ref Range   Total CK 29 (L) 38 - 234 U/L    Comment: Performed at [facility], [address], [place], [place]  [address]  CBC     Status: Abnormal   Collection Time: 06/16/24  3:35 AM  Result Value Ref Range   WBC 10.8 (H) 4.0 - 10.5 K/uL   RBC 3.67 (L) 3.87 - 5.11 MIL/uL   Hemoglobin 10.5 (L) 12.0 - 15.0 g/dL   HCT 66.1 (L) 63.9 - 53.9 %   MCV 92.1 80.0 - 100.0 fL   MCH 28.6 26.0 - 34.0 pg   MCHC 31.1 30.0 - 36.0 g/dL   RDW 81.3 (H) 88.4 - 84.4 %   Platelets 413 (H) 150 - 400 K/uL   nRBC 0.0 0.0 - 0.2 %    Comment: Performed at [facility], [address], [place], [place] [address]  Comprehensive metabolic panel     Status: Abnormal   Collection Time: 06/16/24  3:35 AM  Result Value Ref Range   Sodium 133 (L) 135 - 145 mmol/L   Potassium 3.8 3.5 - 5.1 mmol/L   Chloride 101 98 - 111 mmol/L   CO2 23 22 - 32 mmol/L   Glucose, Bld 119 (H) 70 - 99 mg/dL    Comment: Glucose reference range applies only to samples taken after fasting for at least 8 hours.   BUN 16 8 - 23 mg/dL   Creatinine, Ser 9.56 (L) 0.44 - 1.00 mg/dL   Calcium  8.1 (L) 8.9 - 10.3 mg/dL   Total Protein 7.0 6.5 - 8.1 g/dL   Albumin  1.9 (L) 3.5 - 5.0 g/dL   AST 23 15 - 41 U/L   ALT 17 0 - 44 U/L   Alkaline Phosphatase 299 (H) 38 - 126 U/L   Total Bilirubin 1.1 0.0 - 1.2 mg/dL   GFR, Estimated >39 >39 mL/min    Comment: (NOTE) Calculated using the CKD-EPI Creatinine Equation (2021)    Anion gap 9 5 - 15    Comment: Performed at [facility], [address] [place], [place] [address]  Magnesium      Status: None   Collection Time: 06/16/24  3:35 AM  Result Value Ref Range   Magnesium  2.0 1.7 - 2.4 mg/dL    Comment: Performed at [facility], [address] [place], [place] [address]  Phosphorus     Status: Abnormal   Collection Time: 06/16/24  3:35 AM  Result Value Ref Range   Phosphorus 2.4 (L) 2.5 - 4.6 mg/dL    Comment: Performed at [facility], [address], [place], [place] [address]  Triglycerides     Status: Abnormal   Collection  Time: 06/16/24  3:35 AM  Result Value Ref Range   Triglycerides 182 (H) <150 mg/dL    Comment: Performed at [facility], [address] [place], [place] [address]    Imaging / Studies: CT ABDOMEN PELVIS W CONTRAST Result Date: 06/14/2024 CLINICAL  DATA:  Diarrhea diverting colostomy with anal diarrhea ?enterorectal fistula? EXAM: CT ABDOMEN AND PELVIS WITH CONTRAST TECHNIQUE: Multidetector CT imaging of the abdomen and pelvis was performed using the standard protocol following bolus administration of intravenous contrast. RADIATION DOSE REDUCTION: This exam was performed according to the departmental dose-optimization program which includes automated exposure control, adjustment of the mA and/or kV according to patient size and/or use of iterative reconstruction technique. CONTRAST:  OMNIPAQUE  IOHEXOL  300 MG/ML  SOLN COMPARISON:  06/10/2024 FINDINGS: Lower chest: Moderate bilateral pleural effusions with compressive atelectasis in the lower lobes, unchanged. Hepatobiliary: No focal hepatic abnormality. Gallbladder unremarkable. Pancreas: No focal abnormality or ductal dilatation. Spleen: No focal abnormality.  Normal size. Adrenals/Urinary Tract: Adrenal glands normal. 6.4 cm right lower pole renal cyst is stable. No follow-up imaging recommended. Punctate 1 mm nonobstructing stone in the lower pole of the left kidney. No hydronephrosis. Stomach/Bowel: Right lower quadrant diverting colostomy again noted, unchanged. Gastrostomy tube in the distal stomach, stable. Small bowel decompressed. Postoperative changes in the region of the rectum. No clearcut enterorectal fistula visualized. Vascular/Lymphatic: Aortic atherosclerosis. No evidence of aneurysm or adenopathy. IVC filter in place. Reproductive: Prior hysterectomy.  No adnexal masses. Other: Complex fluid collections again noted. Left upper abdominal complex fluid collection measures 7.1 x 5.8 cm compared to 6.6 x 6.0 cm  previously. Large mixed density fluid collection containing locules of gas noted anteriorly in the pelvis in the region of prior surgical drain (now removed) measures 11 x 5.3 cm on image 57. High-density fluid collection noted deep to the umbilicus measuring 3.8 x 3.1 cm, similar to prior study. Small amount of free fluid throughout the abdomen and pelvis. Musculoskeletal: No acute bony abnormality. IMPRESSION: Postoperative changes with rectal stump and right lower quadrant transverse diverting colostomy. No well-defined or clear enterorectal fistula. Large mixed density fluid collections again noted in the abdomen and pelvis, possibly from prior hemorrhage. The upper abdominal wall fluid collection and the fluid collection deep to the umbilicus are similar to prior study. Anterior pelvic fluid collection containing air and mixed density fluid has increased in size. This is in the area of prior surgical drain, now removed. Moderate bilateral pleural effusions. Compressive atelectasis in the lower lobes. Electronically Signed   By: Franky Crease M.D.   On: 06/14/2024 15:52         Medications / Allergies: per chart  Antibiotics: Anti-infectives (From admission, onward)    Start     Dose/Rate Route Frequency Ordered Stop   06/15/24 1600  meropenem  (MERREM ) 1 g in sodium chloride  0.9 % 100 mL IVPB        1 g 200 mL/hr over 30 Minutes Intravenous Every 8 hours 06/15/24 1510     06/15/24 1600  DAPTOmycin  (CUBICIN ) IVPB 700 mg/100mL premix        700 mg 200 mL/hr over 30 Minutes Intravenous Daily 06/15/24 1510     06/11/24 1545  piperacillin -tazobactam (ZOSYN ) IVPB 3.375 g  Status:  Discontinued        3.375 g 12.5 mL/hr over 240 Minutes Intravenous Every 8 hours 06/11/24 1457 06/15/24 1510   06/09/24 1745  DAPTOmycin  (CUBICIN ) IVPB 500 mg/50mL premix  Status:  Discontinued        500 mg 100 mL/hr over 30 Minutes Intravenous Daily 06/09/24 1647 06/13/24 1539   06/06/24 1000  micafungin   (MYCAMINE ) 100 mg in sodium chloride  0.9 % 100 mL IVPB  Status:  Discontinued        100 mg  105 mL/hr over 1 Hours Intravenous Every 24 hours 06/05/24 1349 06/12/24 1142   06/04/24 1400  piperacillin -tazobactam (ZOSYN ) IVPB 3.375 g        3.375 g 12.5 mL/hr over 240 Minutes Intravenous Every 8 hours 06/04/24 0738 06/09/24 2124   06/04/24 1400  DAPTOmycin  (CUBICIN ) IVPB 500 mg/29mL premix        8 mg/kg  60.9 kg 100 mL/hr over 30 Minutes Intravenous Daily 06/04/24 0738 06/10/24 0126   06/03/24 1000  neomycin  (MYCIFRADIN ) tablet 500 mg  Status:  Discontinued        500 mg Oral 3 times daily 06/03/24 0935 06/03/24 1555   06/03/24 1000  metroNIDAZOLE  (FLAGYL ) tablet 500 mg  Status:  Discontinued       Note to Pharmacy: Take 2 pills (=1000mg ) by mouth at 1pm, 3pm, and 10pm the day before your colorectal operation   500 mg Oral 3 times daily 06/03/24 0935 06/03/24 1555   05/29/24 1500  DAPTOmycin  (CUBICIN ) IVPB 500 mg/50mL premix  Status:  Discontinued        8 mg/kg  60.9 kg 100 mL/hr over 30 Minutes Intravenous Daily 05/29/24 1359 06/04/24 0738   05/28/24 2100  vancomycin  (VANCOREADY) IVPB 750 mg/150 mL  Status:  Discontinued        750 mg 150 mL/hr over 60 Minutes Intravenous Every 12 hours 05/28/24 1419 05/29/24 1359   05/27/24 0800  vancomycin  (VANCOCIN ) IVPB 1000 mg/200 mL premix  Status:  Discontinued        1,000 mg 200 mL/hr over 60 Minutes Intravenous Every 24 hours 05/26/24 1144 05/28/24 1419   05/23/24 1400  piperacillin -tazobactam (ZOSYN ) IVPB 3.375 g  Status:  Discontinued        3.375 g 12.5 mL/hr over 240 Minutes Intravenous Every 8 hours 05/23/24 0804 06/04/24 0738   05/23/24 1200  micafungin  (MYCAMINE ) 100 mg in sodium chloride  0.9 % 100 mL IVPB        100 mg 105 mL/hr over 1 Hours Intravenous Every 24 hours 05/23/24 0755 06/05/24 1221   05/22/24 0800  vancomycin  (VANCOCIN ) IVPB 1000 mg/200 mL premix  Status:  Discontinued        1,000 mg 200 mL/hr over 60 Minutes  Intravenous Every 24 hours 05/21/24 0634 05/26/24 0806   05/21/24 0730  vancomycin  (VANCOREADY) IVPB 1250 mg/250 mL        1,250 mg 166.7 mL/hr over 90 Minutes Intravenous  Once 05/21/24 0634 05/21/24 1043   05/19/24 1400  piperacillin -tazobactam (ZOSYN ) IVPB 3.375 g  Status:  Discontinued        3.375 g 12.5 mL/hr over 240 Minutes Intravenous Every 8 hours 05/19/24 1303 05/23/24 0804   05/09/24 0900  erythromycin  250 mg in sodium chloride  0.9 % 100 mL IVPB        250 mg 100 mL/hr over 60 Minutes Intravenous Every 8 hours 05/09/24 0732 05/11/24 0044   05/08/24 2200  cefoTEtan  (CEFOTAN ) 2 g in sodium chloride  0.9 % 100 mL IVPB        2 g 200 mL/hr over 30 Minutes Intravenous Every 12 hours 05/08/24 1759 05/09/24 0749   05/08/24 1400  neomycin  (MYCIFRADIN ) tablet 1,000 mg  Status:  Discontinued       Placed in And Linked Group   1,000 mg Oral 3 times per day 05/08/24 1119 05/08/24 1120   05/08/24 1400  metroNIDAZOLE  (FLAGYL ) tablet 1,000 mg  Status:  Discontinued       Placed in And Linked Group   1,000  mg Oral 3 times per day 05/08/24 1119 05/08/24 1120   05/08/24 1130  cefoTEtan  (CEFOTAN ) 2 g in sodium chloride  0.9 % 100 mL IVPB        2 g 200 mL/hr over 30 Minutes Intravenous On call to O.R. 05/08/24 1119 05/09/24 0749         Note: Portions of this report may have been transcribed using voice recognition software. Every effort was made to ensure accuracy; however, inadvertent computerized transcription errors may be present.   Any transcriptional errors that result from this process are unintentional.    Elspeth KYM Schultze, MD, FACS, MASCRS Esophageal, Gastrointestinal & Colorectal Surgery Robotic and Minimally Invasive Surgery  [facility] [address] [place], [place] [address] [phone] Fax [phone] Main  CONTACT INFORMATION: Weekday (9AM-5PM): Call [facility] main office at [phone] Weeknight  (5PM-9AM) or Weekend/Holiday: Check EPIC Web Links tab & use AMION (password  [email or web address]) for General Surgery [facility] coverage  Please, DO NOT use SecureChat  (it is not reliable communication to reach operating surgeons & will lead to a delay in care).   Epic staff messaging available for outptient concerns needing 1-2 business day response.      06/16/2024  7:30 AM

## 2024-06-16 NOTE — Progress Notes (Signed)
 Occupational Therapy Treatment Patient Details Name: Elizabeth Odom MRN: [id number] DOB: [birth year] Today's Date: 06/16/2024   History of present illness Pt is 83 y.o. female who presented on 05/08/24 for elective colostomy takedown. Course complicated by A-fib with RVR and then hypotension/rectal bleeding and syncope on 05/11/2024.  Patient  had postop ileus needing NG tube and TPN.  Imaging on 05/19/2024 showed possible abscesses with transgluteal drain on 6/10 and CT guided drain to abdomen and pelvic abscesses yielding pus and stool.  Back to OR 6/24 for LOA, colostomy placement, drainage of abscess, and placement of g-tube with hypotension in OR requiring vasopressors, able to extubate 6/25. 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   OT comments  Pt appearing fatigued. Self feeding liquids upon arrival with set up. Pt able to participate in bed level grooming and bed level A/AROM exercises. Repositioned for comfort in bed using bed pad. Patient will benefit from continued inpatient follow up therapy, <3 hours/day.      If plan is discharge home, recommend the following:  Two people to help with walking and/or transfers;A lot of help with bathing/dressing/bathroom;Assistance with cooking/housework;Direct supervision/assist for medications management;Direct supervision/assist for financial management;Assist for transportation;Help with stairs or ramp for entrance   Equipment Recommendations  Other (comment) (defer to next venue)    Recommendations for Other Services      Precautions / Restrictions Precautions Precautions: Fall Precaution/Restrictions Comments: ABD surgery,  abdominal JP, colostomy, rectal tube Restrictions Weight Bearing Restrictions Per Provider Order: No        Mobility Bed Mobility Overal bed mobility: Needs Assistance Bed Mobility: Rolling Rolling: Total assist         General bed mobility comments: for repositioning, to check pericarea    Transfers                         Balance                                           ADL either performed or assessed with clinical judgement   ADL Overall ADL's : Needs assistance/impaired Eating/Feeding: Set up;Bed level   Grooming: Wash/dry face;Oral care;Bed level;Set up               Lower Body Dressing: Total assistance;Bed level                      Extremity/Trunk Assessment              Vision       Perception     Praxis     Communication     Cognition Arousal: Alert Behavior During Therapy: Flat affect Cognition: No apparent impairments             OT - Cognition Comments: Pt following commands with verbal cues, appropriate conversation, appreciates importance of daily mobility.                          Cueing   Cueing Techniques: Verbal cues  Exercises Other Exercises Other Exercises: Chest press : AAROM , supine , 10 reps, Bil Other Exercises: FF B shoulders x  10 Other Exercises: B elbow flexion and extension with min manual resistance    Shoulder Instructions       General Comments      Pertinent Vitals/ Pain       Pain Assessment Pain Assessment: Faces Faces Pain Scale: Hurts little more Pain Location: back side, hips Pain Descriptors / Indicators: Sore, Discomfort Pain Intervention(s): Monitored during session, Repositioned  Home Living                                          Prior Functioning/Environment              Frequency  Min 2X/week        Progress Toward Goals  OT Goals(current goals can now be found in the care plan section)  Progress towards OT goals: Progressing toward goals  Acute Rehab OT Goals OT Goal Formulation: With  patient Time For Goal Achievement: 06/18/24 Potential to Achieve Goals: Fair  Plan      Co-evaluation                 AM-PAC OT 6 Clicks Daily Activity     Outcome Measure   Help from another person eating meals?: A Little Help from another person taking care of personal grooming?: A Little Help from another person toileting, which includes using toliet, bedpan, or urinal?: Total Help from another person bathing (including washing, rinsing, drying)?: A Lot Help from another person to put on and taking off regular upper body clothing?: A Lot Help from another person to put on and taking off regular lower body clothing?: Total 6 Click Score: 12    End of Session    OT Visit Diagnosis: Pain;Muscle weakness (generalized) (M62.81);Unsteadiness on feet (R26.81)   Activity Tolerance Patient limited by fatigue   Patient Left in bed;with call bell/phone within reach;with bed alarm set   Nurse Communication          Time: 8469-8454 OT Time Calculation (min): 15 min  Charges: OT General Charges $OT Visit: 1 Visit OT Treatments $Therapeutic Activity: 8-22 mins  Elizabeth Odom, OTR/L Acute Rehabilitation Services Office: [phone]   Elizabeth Odom 06/16/2024, 3:52 PM

## 2024-06-16 NOTE — Procedures (Signed)
 Interventional Radiology Procedure Note  Procedure: CT DRAIN LEFT ABD POST OP FLD COLLECRTION    Complications: None  Estimated Blood Loss:  MIN  Findings: AMBER SEROSANG FLUID CX SEND     EMERSON FREDERIC SPECKING, MD

## 2024-06-16 NOTE — Progress Notes (Signed)
 PHARMACY - TOTAL PARENTERAL NUTRITION CONSULT NOTE   Indication: Prolonged ileus  Patient Measurements: Height: 5' 5 (165.1 cm) Weight:  (Unable to weight pt due to zeroed bed) IBW/kg (Calculated) : 57 TPN AdjBW (KG): 61.3 Body mass index is 25.53 kg/m.  Assessment: 70 yoF with history of diverticulitis with perforation and abscess. 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 She requested ostomy takedown and underwent LOA, colostomy takedown, small bowel resection with anastomosis on 5/29. Pharmacy is consulted to dose TPN starting 6/2 for postop ileus.  Post-op course complicated by anastomotic leak, multiple-intraabdominal abscesses. Post-op bleeding. Patient is now s/p diverting loop transverse colostomy.  Glucose / Insulin : no Hx DM, A1c 5.1%. BG goal <180 -CBGs q4h remain < 150 -Not on SSI currently  Electrolytes:  Na 133 (previously maxed in TPN). CoCa 9.87, K 3.8, Phos 2.4, Mg 2.0 - 7/5: Gross specified Keep K>4, Mg>2, Phos>3. Intermittent potassium supplementation with diuresis - 7/7: K Phos  Neutral 500 mg BID x 3 doses ordered by MD this am.  Potassium and phosphorus increased in TPN yesterday.  Hepatic: albumin  remains low 1.8, Alk Phos elevated -TG 174 (6/28) > 205 (6/30)>107 (7/5)   I/O:  - MIVF: NS @kvo  -UOP (foley) :  -Stool: 700 with multiple episodes of watery diarrhea. Place Flexiseal (loperamide  BID, Fiber BID, Lomotil  QID) -Drain:  none noted  GI Imaging:  -6/9 CT: Dempsey dehiscence along the rectal anastomosis, free air and multiple fluid collections -6/16 CT: Similar appearance at the rectal anastomosis concerning for dehiscence -6/23: poss catheter occlusion of PERC drain, several fistulous of sigmoid colon, gas collection, rectal anastomosis with dehiscence cavity communicating to small bowel, Multiple additional small loculated  fluid collection at new percutaneous drainage catheter site. Additional small loculated fluid collection small bowel mesentery.  - 7/1: CT: Multiple complex fluid collections within the upper abdomen  - 7/5: CT: No well-defined or clear enterorectal fistula.  Large mixed density fluid collections again noted in the abdomen and pelvis, possibly from prior hemorrhage. The upper abdominal wall fluid collection and the fluid collection deep to the umbilicus are similar to prior study. Anterior pelvic fluid collection containing air and mixed density fluid has increased in size. This is in the area of prior surgical drain, now removed.   GI Surgeries / Procedures: -5/29: LAR, SBR, colostomy takedown, LOA -6/10 drains placed x 2 in IR (midline abd, transgluteal) -6/17: Replacement of drainage catheter by IR -6/24: diverting loop transverse colostomy, I&D of abdominal abscess, gastrotomy tube placement, rectal tube placement.   Central access: Double lumen PICC placed 6/2 TPN start date: 6/2>7/1, resume 7/4>>  Nutritional Goals: Goal TPN rate is 70 mL/hr (provides 94 g of protein and 1680 kcals per day)  RD Assessment:  Estimated Needs Total Energy Estimated Needs: 1600-1750 kcals Total Protein Estimated Needs: 80-95 grams Total Fluid Estimated Needs: >/= 1.6L  Current Nutrition:  TPN Sips with meds, ice chips  Multiple calorie counts this admission. Not meeting caloric needs. Refusing all supplements. Pt underwent G-tube placement 6/24. Tube feeds started 6/26, stopped for abdominal distention and discomfort.  6/30 Tube feeds started>>stopped 7/3   Plan:  KCl 10 mEq IV q1h x 2  At 1800, continue TPN with lipids at 70 ml/hr (goal rate) Electrolytes in TPN:  Na 140 mEq/L - increased K 65 mEq/L Ca 5 mEq/L Mg 10 mEq/L Phos 24 mmol/L Cl:Ac 1:1 Continue MVI & trace in TPN  Resume sSSI q6h if CBGs trend up >150 Monitor TPN labs on Mon/Thurs, and as needed. BMET, mag, & phos  daily per MD.   Thank you for allowing pharmacy to be a part of this patient's care.  Eleanor EMERSON Agent, PharmD, BCPS Clinical Pharmacist [place] 06/16/2024 9:54 AM

## 2024-06-17 DIAGNOSIS — L259 Unspecified contact dermatitis, unspecified cause: Secondary | ICD-10-CM | POA: Diagnosis not present

## 2024-06-17 DIAGNOSIS — D62 Acute posthemorrhagic anemia: Secondary | ICD-10-CM | POA: Diagnosis not present

## 2024-06-17 DIAGNOSIS — K5790 Diverticulosis of intestine, part unspecified, without perforation or abscess without bleeding: Secondary | ICD-10-CM | POA: Diagnosis not present

## 2024-06-17 DIAGNOSIS — I4819 Other persistent atrial fibrillation: Secondary | ICD-10-CM | POA: Diagnosis not present

## 2024-06-17 LAB — BASIC METABOLIC PANEL WITH GFR
Anion gap: 6 (ref 5–15)
BUN: 18 mg/dL (ref 8–23)
CO2: 25 mmol/L (ref 22–32)
Calcium: 8.2 mg/dL — ABNORMAL LOW (ref 8.9–10.3)
Chloride: 103 mmol/L (ref 98–111)
Creatinine, Ser: 0.36 mg/dL — ABNORMAL LOW (ref 0.44–1.00)
GFR, Estimated: >60 mL/min (ref >60)
Glucose, Bld: 126 mg/dL — ABNORMAL HIGH (ref 70–99)
Potassium: 4 mmol/L (ref 3.5–5.1)
Sodium: 134 mmol/L — ABNORMAL LOW (ref 135–145)

## 2024-06-17 LAB — CBC
HCT: 32.8 % — ABNORMAL LOW (ref 36.0–46.0)
Hemoglobin: 10.1 g/dL — ABNORMAL LOW (ref 12.0–15.0)
MCH: 28.2 pg (ref 26.0–34.0)
MCHC: 30.8 g/dL (ref 30.0–36.0)
MCV: 91.6 fL (ref 80.0–100.0)
Platelets: 426 K/uL — ABNORMAL HIGH (ref 150–400)
RBC: 3.58 MIL/uL — ABNORMAL LOW (ref 3.87–5.11)
RDW: 18.3 % — ABNORMAL HIGH (ref 11.5–15.5)
WBC: 9.5 K/uL (ref 4.0–10.5)
nRBC: 0 % (ref 0.0–0.2)

## 2024-06-17 LAB — PHOSPHORUS: Phosphorus: 2.8 mg/dL (ref 2.5–4.6)

## 2024-06-17 LAB — MAGNESIUM: Magnesium: 2 mg/dL (ref 1.7–2.4)

## 2024-06-17 LAB — GLUCOSE, CAPILLARY
Glucose-Capillary: 128 mg/dL — ABNORMAL HIGH (ref 70–99)
Glucose-Capillary: 128 mg/dL — ABNORMAL HIGH (ref 70–99)
Glucose-Capillary: 128 mg/dL — ABNORMAL HIGH (ref 70–99)
Glucose-Capillary: 132 mg/dL — ABNORMAL HIGH (ref 70–99)

## 2024-06-17 MED ORDER — TRAVASOL 10 % IV SOLN
INTRAVENOUS | Status: AC
  Filled 2024-06-17: qty 940.8

## 2024-06-17 MED ORDER — LOPERAMIDE HCL 1 MG/7.5ML PO SUSP
4.0000 mg | Freq: Four times a day (QID) | ORAL | Status: DC
  Administered 2024-06-17 (×3): 4 mg
  Filled 2024-06-17 (×5): qty 30

## 2024-06-17 MED ORDER — PANTOPRAZOLE SODIUM 40 MG IV SOLR
40.0000 mg | Freq: Every day | INTRAVENOUS | Status: DC
  Administered 2024-06-17: 40 mg via INTRAVENOUS
  Filled 2024-06-17: qty 10

## 2024-06-17 MED ORDER — ALUM & MAG HYDROXIDE-SIMETH 200-200-20 MG/5ML PO SUSP: 30.0000 mL | Freq: Four times a day (QID) | ORAL | Status: DC | PRN

## 2024-06-17 MED ORDER — OXYCODONE HCL 5 MG PO TABS
5.0000 mg | ORAL_TABLET | Freq: Every day | ORAL | Status: DC
  Administered 2024-06-17 – 2024-06-24 (×8): 5 mg
  Filled 2024-06-17 (×8): qty 1

## 2024-06-17 MED ORDER — HYDROXYZINE HCL 25 MG PO TABS: 25.0000 mg | ORAL_TABLET | Freq: Three times a day (TID) | ORAL | Status: DC

## 2024-06-17 MED ORDER — SIMETHICONE 40 MG/0.6ML PO SUSP: 80.0000 mg | Freq: Four times a day (QID) | ORAL | Status: DC | PRN

## 2024-06-17 MED ORDER — OPIUM 10 MG/ML (1%) PO TINC
6.0000 mg | Freq: Two times a day (BID) | ORAL | Status: DC
  Administered 2024-06-17 (×2): 6 mg
  Filled 2024-06-17 (×3): qty 1

## 2024-06-17 MED ORDER — CALCIUM POLYCARBOPHIL 625 MG PO TABS
1250.0000 mg | ORAL_TABLET | Freq: Two times a day (BID) | ORAL | Status: DC
  Administered 2024-06-17 – 2024-06-24 (×16): 1250 mg
  Filled 2024-06-17 (×17): qty 2

## 2024-06-17 MED ORDER — HYDROXYZINE HCL 25 MG PO TABS
25.0000 mg | ORAL_TABLET | Freq: Three times a day (TID) | ORAL | Status: DC | PRN
  Administered 2024-06-17 – 2024-06-18 (×2): 25 mg
  Filled 2024-06-17 (×2): qty 1

## 2024-06-17 MED ORDER — FUROSEMIDE 20 MG PO TABS
20.0000 mg | ORAL_TABLET | Freq: Every day | ORAL | Status: AC
  Administered 2024-06-17 – 2024-06-19 (×3): 20 mg
  Filled 2024-06-17 (×3): qty 1

## 2024-06-17 NOTE — Progress Notes (Signed)
 PHARMACY - TOTAL PARENTERAL NUTRITION CONSULT NOTE   Indication: Prolonged ileus  Patient Measurements: Height: 5' 5 (165.1 cm) Weight:  (Unable to weight pt due to zeroed bed) IBW/kg (Calculated) : 57 TPN AdjBW (KG): 61.3 Body mass index is 25.53 kg/m.  Assessment: 54 yoF with history of diverticulitis with perforation and abscess. 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 She requested ostomy takedown and underwent LOA, colostomy takedown, small bowel resection with anastomosis on 5/29. Pharmacy is consulted to dose TPN starting 6/2 for postop ileus.  Post-op course complicated by anastomotic leak, multiple-intraabdominal abscesses. Post-op bleeding. Patient is now s/p diverting loop transverse colostomy.  Glucose / Insulin : no Hx DM, A1c 5.1%. BG goal <180 -CBGs q6h remain < 150 -Not on SSI currently  Electrolytes:  Na up to 134 (increased in TPN yesterday). CoCa 9.88, K has improved to 4.0, Phos has improved to 2.8, Mg steady/WNL at 2.0  - 7/5: Gross specified Keep K>4, Mg>2, Phos>3. Intermittent potassium supplementation with diuresis - 7/7: K Phos  Neutral 500 mg BID x 3 doses ordered by MD this am.  Potassium and phosphorus increased in TPN yesterday.  Hepatic: albumin  remains low 1.9, Alk Phos elevated -TG 174 (6/28) > 205 (6/30)>107 (7/5) >182 (7/7)  I/O:  - MIVF: none -UOP (foley) :2000 ml  -Stool: 700 with multiple episodes of watery diarrhea. Flexiseal since 7/3 (loperamide  BID, Fiber BID, Lomotil  QID, opium  tincture BID) -Drain:  455  GI Imaging:  -6/9 CT: Dempsey dehiscence along the rectal anastomosis, free air and multiple fluid collections -6/16 CT: Similar appearance at the rectal anastomosis concerning for dehiscence -6/23: poss catheter occlusion of PERC drain, several fistulous of sigmoid colon, gas collection, rectal anastomosis with  dehiscence cavity communicating to small bowel, Multiple additional small loculated fluid collection at new percutaneous drainage catheter site. Additional small loculated fluid collection small bowel mesentery.  - 7/1: CT: Multiple complex fluid collections within the upper abdomen  - 7/5: CT: No well-defined or clear enterorectal fistula.  Large mixed density fluid collections again noted in the abdomen and pelvis, possibly from prior hemorrhage. The upper abdominal wall fluid collection and the fluid collection deep to the umbilicus are similar to prior study. Anterior pelvic fluid collection containing air and mixed density fluid has increased in size. This is in the area of prior surgical drain, now removed.   GI Surgeries / Procedures: -5/29: LAR, SBR, colostomy takedown, LOA -6/10 drains placed x 2 in IR (midline abd, transgluteal) -6/17: Replacement of drainage catheter by IR -6/24: diverting loop transverse colostomy, I&D of abdominal abscess, gastrotomy tube placement, rectal tube placement.  -7/3: CT guided visceral fluid drain by perc cath -7/7: CT drainage of the left anterior abdominal fluid collection  Central access: Double lumen PICC placed 6/2 TPN start date: 6/2>7/1, resume 7/4>>  Nutritional Goals: Goal TPN rate is 70 mL/hr (provides 94 g of protein and 1680 kcals per day)  RD Assessment:  Estimated Needs Total Energy Estimated Needs: 1600-1750 kcals Total Protein Estimated Needs: 80-95 grams Total Fluid Estimated Needs: >/= 1.6L  Current Nutrition:  TPN Sips with meds, ice chips  Multiple calorie counts this admission. Not meeting caloric needs. Refusing all supplements. Pt underwent G-tube placement 6/24. Tube feeds started 6/26, stopped for abdominal distention and discomfort.  6/30 Tube feeds started>>stopped 7/3   Plan:  At 1800, continue TPN with lipids at 70 ml/hr (goal rate) Electrolytes in TPN:  Na 140 mEq/L K 65 mEq/L Ca 5 mEq/L Mg 10  mEq/L Phos 25 mmol/L - increased Cl:Ac 1:1 Continue MVI & trace in TPN  Resume sSSI q6h if CBGs trend up >150 Monitor TPN labs on Mon/Thurs, and as needed. BMET, mag, & phos daily per MD.   Thank you for allowing pharmacy to be a part of this patient's care.  Eleanor EMERSON Agent, PharmD, BCPS Clinical Pharmacist [facility] 06/17/2024 7:34 AM

## 2024-06-17 NOTE — Progress Notes (Addendum)
 PROGRESS NOTE    Elizabeth Odom  FMW:[id number] DOB: [birth year] DOA: 05/08/2024 PCP: Jesus Bernardino MATSU, MD   Brief Narrative: Elizabeth Odom is a 83 y.o. female with a history of atrial fibrillation, DVT, PE, anemia, PACs, hypertension, mild cognitive impairment, large bowel obstruction. Patient initially presented for surgical management involving history of diverticulitis with planned ostomy takedown with anastomosis. During hospitalization, patient developed altered mental status secondary to polypharmacy in addition to atrial fibrillation with RVR. Cardiology consulted for recommendation and management with subsequent improvement in RVR. On 6/24, patient underwent repeat laparoscopy converted to laparotomy with diverting loops transverse colostomy performed, in addition to drainage of an abdominal abscess and gastrostomy/rectal tube placement. Patient required admission to ICU post-operatively for respiratory failure but was extubated successfully on 6/25 and subsequent transferred out of ICU.    Assessment and Plan:  Persistent atrial fibrillation/flutter Cardiology consulted for management.  Medication adjusted to metoprolol  XL 25 mg daily with recommendation for Eliquis  when okay per surgery. Toprol -XL decreased to 12.5 mg daily. Eliquis  held and transitioned to heparin  IV. Heparin  now held secondary to acute anemia with concern for hematoma. -Continue Toprol -XL 12.5 mg daily  History of diverticulitis with perforation and abscess with colostomy status post ostomy takedown and re-do Ileus Per primary.  Infectious diseases consulted and are planning for management with multi week course of antibiotics.  Urine culture (6/12) significant for Pseudomonas, MRSA, Enterococcus faecalis, Candida albicans.  Drain culture (6/17) significant for rare Pseudomonas aeruginosa.  Ongoing antibiotics per infectious disease.  Patient now on daptomycin , micafungin , Zosyn . Patient underwent diverting  colostomy on 6/24 with gastrotomy tube placement. TPN weaned off. Tube feeds started. Tube feeds now held and TPN re-ordered. Repeat CT abdomen/pelvis (7/5) significant for large fluid collections seen from prior CT imaging; IR consulted for drain, with left abdominal drain placed on 7/7 -Ongoing general surgery recommendations  Intraabdominal abscess Infectious disease consulted. Culture data is polymicrobial and evident for pseudomonas aeruginosa, staphylococcus aureus and candida albicans. Candida treated with micafungin  and course is completed. Abdominal drain fluid culture (7/2) significant for enterococcus faecalis, clostridium innocuum, MRSA. Left abdominal drain placed on 7/7 -ID recommendations: Meropenem , daptomycin  -Follow-up culture (7/7) data  Acute blood loss anemia Initially secondary to surgery, requiring multiple transfusions. Patient with recurrent significant anemia with concern for intraabdominal abscess vs hematoma. Patient then developed blood per rectum requiring multiple transfusions. Patient has received a total of 9 units of PRBC via transfusion. Hemoglobin stable. Will likely need to avoid anticoagulation completely in the future. -Follow-up post-transfusion H&H and transfuse as needed -Ongoing general surgery recommendations  Pleural effusions Moderate in size and bilateral. Currently asymptomatic. -If develops symptoms, will need a thoracentesis  Rectal output Unclear source as patient has a diverting colostomy, however she has minimal output from colostomy. Concern for possible fistula, however no fistula was identified on CT abdomen/pelvis (7/5). -Per general surgery  Contact dermatitis Noted on back. Very symptomatic with itching. Discussed with ID and not likely related to a drug reaction. No eosinophilia on differential. -Continue Atarax , triamcinolone , barrier cream  AKI Mild. Resolved.  Hypoalbuminemia Noted. Associated poor nutrition. Patient is on  TPN.  Leukocytosis In setting of surgery and infection. Mildly elevated.  Thrombocytosis Noted. Likely reactive. Mildly elevated.  Hyponatremia Mild. Stable.  Syncope Syncopal episode occurred during hospitalization while patient was on commode.  Concern for possible vasovagal versus hypotension etiology.  Transthoracic echo obtained with evidence of preserved LVEF of 60 to 65% in addition to no aortic stenosis.  Acute urinary retention Foley inserted on 6/4. Flomax  started. Foley removed on 6/13. Flomax  discontinued.  Acute metabolic encephalopathy Delirium Resolved.  History of DVT/PE LLE swelling Patient previously did not tolerate anticoagulation, requiring the placement of a retrievable IVC filter on 10/05/2023. CT this admission confirms its placement at this time. RLE venous duplex without evidence of acute DVT, but there is evidence of chronic DVT involving the left posterior tibial veins and left peroneal veins. -Continue IVC filter and will avoid anticoagulation  Generalized weakness Physical therapy is ordered already.  Discussed with patient that is important for her to participate as much as possible with therapy to improve her strength.   DVT prophylaxis: SCDs Code Status:   Code Status: Full Code Family Communication: None at bedside Disposition Plan: Discharge pending continued general surgery and ID recommendations. Anticipate discharge to SNF when medically ready.   Consultants:  General surgery PCCM Infectious disease  Procedures:    Antimicrobials: Zosyn  Daptomycin  Micafungin  Meropenem  Vancomycin    Subjective: Continued itching. No other concerns.  Objective: BP 138/86 (BP Location: Left Arm)   Pulse 94   Temp 98.6 F (37 C) (Oral)   Resp 16   Ht 5' 5 (1.651 m)   Wt 69.6 kg   SpO2 95%   BMI 25.53 kg/m   Examination:  General exam: Appears calm and comfortable Respiratory system: Clear to auscultation. Respiratory effort  normal. Cardiovascular system: S1 & S2 heard. Gastrointestinal system: Abdomen is nondistended, soft and nontender. Normal bowel sounds heard. Ostomy bag is empty. Central nervous system: Alert and oriented.  Musculoskeletal: No edema. No calf tenderness Skin: No cyanosis. No rashes   Data Reviewed: I have personally reviewed following labs and imaging studies  CBC Lab Results  Component Value Date   WBC 9.5 06/17/2024   RBC 3.58 (L) 06/17/2024   HGB 10.1 (L) 06/17/2024   HCT 32.8 (L) 06/17/2024   MCV 91.6 06/17/2024   MCH 28.2 06/17/2024   PLT 426 (H) 06/17/2024   MCHC 30.8 06/17/2024   RDW 18.3 (H) 06/17/2024   LYMPHSABS 2.1 06/16/2024   MONOABS 0.6 06/16/2024   EOSABS 0.3 06/16/2024   BASOSABS 0.1 06/16/2024     Last metabolic panel Lab Results  Component Value Date   NA 134 (L) 06/17/2024   K 4.0 06/17/2024   CL 103 06/17/2024   CO2 25 06/17/2024   BUN 18 06/17/2024   CREATININE 0.36 (L) 06/17/2024   GLUCOSE 126 (H) 06/17/2024   GFRNONAA >60 06/17/2024   GFRAA >60 04/10/2016   CALCIUM  8.2 (L) 06/17/2024   PHOS 2.8 06/17/2024   PROT 7.0 06/16/2024   ALBUMIN  1.9 (L) 06/16/2024   LABGLOB 2.9 04/08/2024   BILITOT 1.1 06/16/2024   ALKPHOS 299 (H) 06/16/2024   AST 23 06/16/2024   ALT 17 06/16/2024   ANIONGAP 6 06/17/2024    GFR: Estimated Creatinine Clearance: 52.2 mL/min (A) (by C-G formula based on SCr of 0.36 mg/dL (L)).  Recent Results (from the past 240 hours)  Aerobic/Anaerobic Culture w Gram Stain (surgical/deep wound)     Status: None   Collection Time: 06/11/24  4:12 PM   Specimen: Abdomen; Abdominal Fluid  Result Value Ref Range Status   Specimen Description   Final    ABDOMEN Performed at [facility], [address] [place], [place] [address]    Special Requests   Final    ABDOMINAL FLUID Performed at [facility], [address], [place], [place] [address]  Gram Stain   Final    ABUNDANT WBC  PRESENT, PREDOMINANTLY PMN ABUNDANT GRAM NEGATIVE RODS FEW GRAM POSITIVE COCCI    Culture   Final    MODERATE PSEUDOMONAS AERUGINOSA Two isolates with different morphologies were identified as the same organism.The most resistant organism was reported. FEW METHICILLIN RESISTANT STAPHYLOCOCCUS AUREUS MODERATE ENTEROCOCCUS FAECALIS MODERATE CLOSTRIDIUM INNOCUUM Standardized susceptibility testing for this organism is not available. Performed at [facility], [address], [place], [place] [address]    Report Status 06/16/2024 FINAL  Final   Organism ID, Bacteria PSEUDOMONAS AERUGINOSA  Final   Organism ID, Bacteria ENTEROCOCCUS FAECALIS  Final   Organism ID, Bacteria METHICILLIN RESISTANT STAPHYLOCOCCUS AUREUS  Final      Susceptibility   Enterococcus faecalis - MIC*    AMPICILLIN <=2 SENSITIVE Sensitive     VANCOMYCIN  1 SENSITIVE Sensitive     GENTAMICIN SYNERGY SENSITIVE Sensitive     * MODERATE ENTEROCOCCUS FAECALIS   Methicillin resistant staphylococcus aureus - MIC*    CIPROFLOXACIN >=8 RESISTANT Resistant     ERYTHROMYCIN  >=8 RESISTANT Resistant     GENTAMICIN <=0.5 SENSITIVE Sensitive     OXACILLIN >=4 RESISTANT Resistant     TETRACYCLINE <=1 SENSITIVE Sensitive     VANCOMYCIN  1 SENSITIVE Sensitive     TRIMETH/SULFA >=320 RESISTANT Resistant     CLINDAMYCIN <=0.25 SENSITIVE Sensitive     RIFAMPIN <=0.5 SENSITIVE Sensitive     Inducible Clindamycin NEGATIVE Sensitive     LINEZOLID 2 SENSITIVE Sensitive     * FEW METHICILLIN RESISTANT STAPHYLOCOCCUS AUREUS   Pseudomonas aeruginosa - MIC*    CEFTAZIDIME >=64 RESISTANT Resistant     CIPROFLOXACIN 0.5 SENSITIVE Sensitive     GENTAMICIN 4 SENSITIVE Sensitive     IMIPENEM 2 SENSITIVE Sensitive     * MODERATE PSEUDOMONAS AERUGINOSA  Aerobic/Anaerobic Culture w Gram Stain (surgical/deep wound)     Status: None (Preliminary result)   Collection Time: 06/16/24  1:08 PM   Specimen: Abscess  Result Value Ref Range Status    Specimen Description   Final    ABSCESS ABDOMEN Performed at [facility], [address] [place], [place] [address]    Special Requests   Final    NONE Performed at [facility], [address], [place], [place] [address]    Gram Stain NO WBC SEEN NO ORGANISMS SEEN   Final   Culture   Final    NO GROWTH < 12 HOURS Performed at [facility], [address] [place], [place] [address]    Report Status PENDING  Incomplete      Radiology Studies: VAS US  LOWER EXTREMITY VENOUS (DVT) Result Date: 06/16/2024  Lower Venous DVT Study Patient Name:  Elizabeth Odom  Date of Exam:   06/16/2024 Medical Rec #: [id number]       Accession #:    [id number] Date of Birth: 04/24/1941      Patient Gender: F Patient Age:   70 years Exam Location:  [facility] Procedure:      VAS US  LOWER EXTREMITY VENOUS (DVT) Referring Phys: Maggie Dworkin --------------------------------------------------------------------------------  Indications: Edema. Extensive hospital stay, intolerant to anticoagulants. Other Indications: Large bowel obstruction, status post multiple abdominal                    surgeries, including colostomy and colostomy reversal. Risk Factors: DVT 10/04/23 left popliteal, posterior tibial, peroneal, and gastroc veins History of PE. Patient had  IVC filter placement 10/05/23 by IR. Atrial fibrillation. Comparison Study: Prior bilateral LEV done 10/04/2023 Performing Technologist: Alberta Lis RVS  Examination Guidelines: A complete evaluation includes B-mode imaging, spectral Doppler, color Doppler, and power Doppler as needed of all accessible portions of each vessel. Bilateral testing is considered an integral part of a complete examination. Limited examinations for reoccurring indications may be performed as noted. The reflux portion of the exam is performed with the patient in reverse Trendelenburg.   +---------+---------------+---------+-----------+----------+--------------+ RIGHT    CompressibilityPhasicitySpontaneityPropertiesThrombus Aging +---------+---------------+---------+-----------+----------+--------------+ CFV      Full           Yes      Yes                                 +---------+---------------+---------+-----------+----------+--------------+ SFJ      Full                                                        +---------+---------------+---------+-----------+----------+--------------+ FV Prox  Full           Yes      Yes                                 +---------+---------------+---------+-----------+----------+--------------+ FV Mid   Full           Yes      Yes                                 +---------+---------------+---------+-----------+----------+--------------+ FV DistalFull                                                        +---------+---------------+---------+-----------+----------+--------------+ PFV      Full           Yes      Yes                                 +---------+---------------+---------+-----------+----------+--------------+ POP      Full           Yes      Yes                                 +---------+---------------+---------+-----------+----------+--------------+ PTV      Full                                                        +---------+---------------+---------+-----------+----------+--------------+ PERO     Full                                                        +---------+---------------+---------+-----------+----------+--------------+  Soleal   Full                                                        +---------+---------------+---------+-----------+----------+--------------+ Gastroc  Full                                                        +---------+---------------+---------+-----------+----------+--------------+ SSV      Full                                                         +---------+---------------+---------+-----------+----------+--------------+   +---------+---------------+---------+-----------+----------+--------------+ LEFT     CompressibilityPhasicitySpontaneityPropertiesThrombus Aging +---------+---------------+---------+-----------+----------+--------------+ CFV      Full           Yes      Yes                                 +---------+---------------+---------+-----------+----------+--------------+ SFJ      Full                                                        +---------+---------------+---------+-----------+----------+--------------+ FV Prox  Full                                                        +---------+---------------+---------+-----------+----------+--------------+ FV Mid   Full           Yes      Yes                                 +---------+---------------+---------+-----------+----------+--------------+ FV DistalFull           Yes      No                                  +---------+---------------+---------+-----------+----------+--------------+ PFV      Full                                                        +---------+---------------+---------+-----------+----------+--------------+ POP      Full           Yes      Yes                                 +---------+---------------+---------+-----------+----------+--------------+  PTV      Partial                                                     +---------+---------------+---------+-----------+----------+--------------+ PERO     Partial                                      Chronic        +---------+---------------+---------+-----------+----------+--------------+ Soleal                                                Chronic        +---------+---------------+---------+-----------+----------+--------------+ Gastroc  Full                                                         +---------+---------------+---------+-----------+----------+--------------+     Summary: RIGHT: - There is no evidence of deep vein thrombosis in the lower extremity.  - No cystic structure found in the popliteal fossa. interstitial edema noted in popliteal fossa and throughout calf  LEFT: - Findings consistent with chronic deep vein thrombosis involving the left posterior tibial veins, and left peroneal veins.  - There is no evidence of acute deep vein thrombosis in the lower extremity.  Interstitial edema noted in popliteal fossa and throughout calf.  *See table(s) above for measurements and observations. Electronically signed by Fonda Rim on 06/16/2024 at 5:50:11 PM.    Final    CT GUIDED VISCERAL FLUID DRAIN BY PERC CATH Result Date: 06/16/2024 INDICATION: POSTOP LEFT ABDOMINAL FLUID COLLECTION EXAM: CT DRAINAGE OF THE LEFT ANTERIOR ABDOMINAL FLUID COLLECTION MEDICATIONS: The patient is currently admitted to the hospital and receiving intravenous antibiotics. The antibiotics were administered within an appropriate time frame prior to the initiation of the procedure. ANESTHESIA/SEDATION: Moderate (conscious) sedation was employed during this procedure. A total of Versed  0 mg and Fentanyl  50 mcg was administered intravenously by the radiology nurse. Total intra-service moderate Sedation Time: 9 minutes. The patient's level of consciousness and vital signs were monitored continuously by radiology nursing throughout the procedure under my direct supervision. COMPLICATIONS: None immediate. PROCEDURE: Informed written consent was obtained from the patient after a thorough discussion of the procedural risks, benefits and alternatives. All questions were addressed. Maximal Sterile Barrier Technique was utilized including caps, mask, sterile gowns, sterile gloves, sterile drape, hand hygiene and skin antiseptic. A timeout was performed prior to the initiation of the procedure. previous imaging reviewed. patient  positioned supine. noncontrast localization CT performed. the left anterior abdominal fluid collection was localized and marked for a left lateral approach. Under sterile conditions and local anesthesia, the 18 gauge trocar needle was advanced from a anterior left lateral approach into the fluid collection. Needle position confirmed with CT. Guidewire inserted and confirmed within the collection. Tract dilatation performed to insert a 10 [place] drain. Drain catheter position confirmed with CT. Serosanguineous fluid aspirated and sent for culture. Catheter secured with a silk suture and a sterile dressing. External suction  bulb applied. Patient tolerated the procedure well. No immediate complication. IMPRESSION: Successful CT-guided drainage of the left anterior abdominal postop fluid collection. Culture sent. Electronically Signed   By: CHRISTELLA.  Shick M.D.   On: 06/16/2024 16:03        LOS: 40 days    Elgin Lam, MD [facility] 06/17/2024, 12:41 PM   If 7PM-7AM, please contact night-coverage [email or web address]

## 2024-06-17 NOTE — Progress Notes (Signed)
 Nutrition Follow-up  DOCUMENTATION CODES:   Severe malnutrition in context of chronic illness  INTERVENTION:  - Plan to continue goal TPN.             - TPN per pharmacy.    - Clear Liquid diet with fluid restriction per MD.    - Once able to restart tube feeds, would recommend transition back to 24 hour feeds for better tolerance: Osmolite 1.5 @ 50 ml/hr *Consider starting at 20 ml/hr and advancing by 10 ml every 4 hours  Provides 1800 kcals, 75g protein and 914 ml H2O    - Monitor weight trends.   NUTRITION DIAGNOSIS:   Severe Malnutrition related to chronic illness as evidenced by severe fat depletion, severe muscle depletion. *ongoing  GOAL:   Patient will meet greater than or equal to 90% of their needs *met with TPN  MONITOR:   Diet advancement, Labs, Weight trends  REASON FOR ASSESSMENT:   Consult New TPN/TNA  ASSESSMENT:   83 year old female with PMH HTN, mild cognitive impairment, asd large bowel obstruction (October 2024) s/p Hartman's resection and colostomy creation who was admitted 5/29 for colostomy reversal.  5/29 Admit; s/p colostomy reversal, small bowel resection, and extensive lysis of adhesions; CLD 5/30 Soft diet -> FLD 6/2 NPO; NGT placed; TPN to be initiated 6/3 CLD 6/4-6/5 Dysphagia 1 -> Soft-> Reg diet, Calorie Count #1 completed, consumed 11-14% of needs 6/6 Soft diet 6/7-6/8 Calorie Count #2 completed, consumed 10-22% of needs 6/9 Heart Healthy -> NPO 6/10 s/p drain placement 6/11 CLD 6/12 FLD -> Regular diet -> soft diet 6/13-6/15 Regular diet, Calorie Count #3 -consumed 1-4% of needs 6/16 Heart Healthy diet 6/17 NPO ->Regular diet 6/19-6/20 Calorie Count #4 -pt consumed 0-10% of needs 6/24 TPN increased back to goal; NPO; s/p G-tube placement, diverting loop colostomy, drain placement, returned to ICU intubated 6/25 Extubated 6/26 Trickle feeds to be started via G-tube; SLP eval -> ice chips prn 6/30 TF advancing to goal  rate, SLP recommended DYS 1 diet  7/1 Surgery transitioned patient to nocturnal TF regimen of 75mL x16 hours; TPN weaned off 7/2 CLD 7/3 Patient developed bloody diarrhea out of anus; TF's held 7/4 TPN restarting at 75mL/hr 7/5 TPN increased back to goal of 27mL/hr 7/6 NPO 7/7 CLD - fluid restriction  Patient working with another provider at time of visit.  TPN remains at goal of 26mL/hr, providing 1680 kcals and 94g of protein.  G-tube remains to gravity and patient on clear liquids, does not appear to be taking in anything substantial.  Had SLP eval today, recommended to continue thin clear liquids. SLP signed off.    Admit weight: 127# Current weight: 153# I&O's: +10.2L since 6/24   Medications: 500mg  Vitamin C  daily, Ferrous sulfate , Imodium  QID, Fibercon BID   Labs reviewed:  Na 134 Triglycerides 182 (as of 7/7)  Diet Order:   Diet Order             Diet clear liquid Room service appropriate? Yes; Fluid consistency: Thin; Fluid restriction: 1200 mL Fluid  Diet effective now           Diet - low sodium heart healthy                   EDUCATION NEEDS:  Education needs have been addressed  Skin:  Skin Assessment: Skin Integrity Issues: Skin Integrity Issues:: Stage II, Incisions Stage II: Right Buttocks Incisions: Abdomen  Last BM:  7/7 - rectal tube  and colostomy  Height:  Ht Readings from Last 1 Encounters:  05/09/24 5' 5 (1.651 m)   Weight:  Wt Readings from Last 1 Encounters:  06/14/24 69.6 kg   BMI:  Body mass index is 25.53 kg/m.  Estimated Nutritional Needs:   Kcal:  1600-1750 kcals Protein:  80-95 grams Fluid:  >/= 1.6L    Trude Ned RD, LDN Contact via Secure Chat.

## 2024-06-17 NOTE — Plan of Care (Signed)
  Problem: Education: Goal: Verbalization of understanding of the causes of altered bowel function will improve Outcome: Progressing   Problem: Bowel/Gastric: Goal: Gastrointestinal status for postoperative course will improve Outcome: Progressing   Problem: Health Behavior/Discharge Planning: Goal: Identification of community resources to assist with postoperative recovery needs will improve Outcome: Progressing   Problem: Nutritional: Goal: Will attain and maintain optimal nutritional status will improve Outcome: Progressing   Problem: Clinical Measurements: Goal: Postoperative complications will be avoided or minimized Outcome: Progressing   Problem: Respiratory: Goal: Respiratory status will improve Outcome: Progressing   Problem: Skin Integrity: Goal: Will show signs of wound healing Outcome: Progressing   Problem: Education: Goal: Knowledge of General Education information will improve Description: Including pain rating scale, medication(s)/side effects and non-pharmacologic comfort measures Outcome: Progressing   Problem: Health Behavior/Discharge Planning: Goal: Ability to manage health-related needs will improve Outcome: Progressing   Problem: Clinical Measurements: Goal: Ability to maintain clinical measurements within normal limits will improve Outcome: Progressing Goal: Will remain free from infection Outcome: Progressing Goal: Diagnostic test results will improve Outcome: Progressing Goal: Respiratory complications will improve Outcome: Progressing Goal: Cardiovascular complication will be avoided Outcome: Progressing   Problem: Activity: Goal: Risk for activity intolerance will decrease Outcome: Progressing   Problem: Coping: Goal: Level of anxiety will decrease Outcome: Progressing   Problem: Elimination: Goal: Will not experience complications related to bowel motility Outcome: Progressing Goal: Will not experience complications related to  urinary retention Outcome: Progressing   Problem: Pain Managment: Goal: General experience of comfort will improve and/or be controlled Outcome: Progressing   Problem: Safety: Goal: Ability to remain free from injury will improve Outcome: Progressing   Problem: Skin Integrity: Goal: Risk for impaired skin integrity will decrease Outcome: Progressing   Problem: Education: Goal: Ability to describe self-care measures that may prevent or decrease complications (Diabetes Survival Skills Education) will improve Outcome: Progressing Goal: Individualized Educational Video(s) Outcome: Progressing   Problem: Coping: Goal: Ability to adjust to condition or change in health will improve Outcome: Progressing   Problem: Fluid Volume: Goal: Ability to maintain a balanced intake and output will improve Outcome: Progressing   Problem: Health Behavior/Discharge Planning: Goal: Ability to identify and utilize available resources and services will improve Outcome: Progressing Goal: Ability to manage health-related needs will improve Outcome: Progressing   Problem: Metabolic: Goal: Ability to maintain appropriate glucose levels will improve Outcome: Progressing   Problem: Nutritional: Goal: Maintenance of adequate nutrition will improve Outcome: Progressing Goal: Progress toward achieving an optimal weight will improve Outcome: Progressing   Problem: Skin Integrity: Goal: Risk for impaired skin integrity will decrease Outcome: Progressing   Problem: Tissue Perfusion: Goal: Adequacy of tissue perfusion will improve Outcome: Progressing   Problem: Activity: Goal: Ability to tolerate increased activity will improve Outcome: Progressing   Problem: Respiratory: Goal: Ability to maintain a clear airway and adequate ventilation will improve Outcome: Progressing   Problem: Role Relationship: Goal: Method of communication will improve Outcome: Progressing

## 2024-06-17 NOTE — Progress Notes (Signed)
 Foley removed at 1300.  Bladder scan at 1700:  1st Void after foley removal at 1710: 50mL

## 2024-06-17 NOTE — Consult Note (Addendum)
 WOC Nurse ostomy follow-up consult note  Pt did not awaken, watch or participate in ostomy pouch change. She will need total assistance with pouch application and emptying after discharge.  No family members at the bedside.  Stoma type/location: Stoma is red and viable, 1 1/4 inches, flush with skin level, 30 cc brown liquid in the pouch. Applied barrier ring and one piece flexible convex pouch. 5 sets of supplies ordered to the bedside for staff nurse's use: Use barrier ring, Lawson # [phone] and one piece convex pouch Lawson # [id number].  Enrolled patient in [employer] DC program: Yes, previously WOC team will continue to follow. Thank-you,  Stephane Fought MSN, RN, CWOCN, [place], CNS [phone]

## 2024-06-17 NOTE — Progress Notes (Signed)
  Daily Progress Note   Patient Name: Elizabeth Odom       Date: 06/17/2024 DOB: Feb 13, 1941  Age: 83 y.o. MRN#: [id number] Attending Physician: [name], MD Primary Care Physician: Jesus Bernardino MATSU, MD Admit Date: 05/08/2024 Length of Stay: 40 days  Reason for Consultation/Follow-up: Establishing goals of care  Subjective:   Reviewed recent EMR documentation as well as surgery documentation  Patient resting in bed, appears chronically ill. left abdominal drain placed on 7/7  Vital Signs:  BP 138/86 (BP Location: Left Arm)   Pulse 94   Temp 98.6 F (37 C) (Oral)   Resp 16   Ht 5' 5 (1.651 m)   Wt 69.6 kg   SpO2 95%   BMI 25.53 kg/m   Physical Exam: General:resting comfortably cachectic, frail Cardiovascular: RRR Respiratory: no increased work of breathing noted, not in respiratory distress    Assessment & Plan:   Assessment: Patient is an 83 year old female with a past medical history of DVT, PE, atrial fibrillation, hypertension, mild cognitive impairment, and large bowel obstruction status post colonic resection and colostomy creation in 2020 for which has been complicated by anastomotic leak.  Patient presented last month for colostomy resection and takedown but had complicated postop course with dense adhesions, A-fib with RVR, intra-abdominal abscess, and poor oral intake requiring support of TPN and PEG placement.  Palliative medicine team consulted to assist with complex medical decision making.  Recommendations/Plan: # Complex medical decision making/goals of care:  - Surgery note reviewed, palliative team on standby, available to be of further support this week, depending on hospital course. Grand daughter [facility] familiar with various members of the palliative team, including this Clinical research associate.  Note palliative medicine team will continue to engage in conversations as able and appropriate.  -  Code Status: Full Code    Will revisit full code versus DNR DNI discussion,  based on hospital course this week.  # Symptom management: Surgical team managing patient's postop pain. Agree. Medication history noted.  left abdominal drain placed on 7/7  Ongoing general surgery recommendations  # Psychosocial support  - Already consulted chaplain to assist with spiritual support. Appreciate spiritual care.   # Discharge Planning: To Be Determined  Thank you for allowing the palliative care team to participate in the care Kahlani M Pieri.  Low MDM Lonia Serve MD.  Palliative Care Provider PMT # [phone]  If patient remains symptomatic despite maximum doses, please call PMT at [phone] between 0700 and 1900. Outside of these hours, please call attending, as PMT does not have night coverage.

## 2024-06-17 NOTE — Progress Notes (Signed)
 PT Cancellation Note  Patient Details Name: Elizabeth Odom MRN: [id number] DOB: [birth year]   Cancelled Treatment:    Reason Eval/Treat Not Completed: Medical issues which prohibited therapy. RN reports pt's belly is hard and extended, awaiting direction from MD, requesting therapy check back as schedule permits.    Metta Ave PT, DPT 06/17/24, 12:35 PM

## 2024-06-17 NOTE — Progress Notes (Signed)
 Speech Language Pathology Treatment: Dysphagia  Patient Details Name: Elizabeth Odom MRN: [id number] DOB: 17-Jun-1941 Today's Date: 06/17/2024 Time: 8894-8879 SLP Time Calculation (min) (ACUTE ONLY): 15 min  Assessment / Plan / Recommendation Clinical Impression  Pt seen at bedside for assessment of PO tolerance. Pt currently limited to clear liquids only. SLP provided sips of ginger ale via straw. Delayed cough noted x1 given multiple presentations. RN reports pt has been doing well with clear liquids. Primary nutrition/hydration via PEG and TPN. ST will sign off at this time. Please reconsult if needs arise.   HPI HPI: 83 y.o. female s/p rectosigmoid resection takedown on 5/29. Course complicated by A-fib with RVR and then hypotension/rectal bleeding and syncope on 05/11/2024.  Patient  had postop ileus needing NG tube and TPN.  Imaging on 05/19/2024 showed possible abscesses. S/P IR guided drains placed on 05/20/2024.  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  Swallow eval ordered. Pt is s/p PEG placement.  She required intubation for surgery 6/24 and was extubated 6/25.  Chest imaging showed Enteric tube with side hole just below the GE junction, recommend  advancement.     Mild prominence of the central pulmonary vasculature without overt  pulmonary edema.      SLP Plan  Discharge SLP treatment due to goals met          Recommendations  Diet recommendations: Thin liquid;Other(comment) (clear liquids) Liquids provided via: Straw;Cup Medication Administration: Other (Comment) (via PEG tube) Supervision: Patient able to self feed Compensations: Slow rate;Small sips/bites Postural Changes and/or Swallow Maneuvers: Seated upright 90 degrees                  Oral  care BID;Staff/trained caregiver to provide oral care;Oral care before and after PO   Intermittent Supervision/Assistance Dysphagia, unspecified (R13.10)     Discharge SLP treatment due to goals met    Taniesha Glanz B. Dory, MSP, CCC-SLP Speech Language Pathologist  Dory Caprice Daring 06/17/2024, 11:28 AM

## 2024-06-17 NOTE — Progress Notes (Signed)
 06/17/2024  Elizabeth Odom [id number] June 23, 1941  CARE TEAM: PCP: Jesus Bernardino MATSU, MD  Outpatient Care Team: Patient Care Team: Jesus Bernardino MATSU, MD as PCP - General (Internal Medicine) [name], MD as Consulting Physician (Gastroenterology) [name], MD (Neurology) [name], MD as Consulting Physician (Urology) [name], MD as Consulting Physician (Cardiology) [name], MD as Consulting Physician (Colon and Rectal Surgery)  Inpatient Treatment Team: Treatment Team:  [name], MD [name], RN [name], MD Bobbette Clause, MD [name], MD [name], MD [name], MD Wynona Doyal GAILS, NT Olene Edsel SAILOR, RN Bonney Dufm BROCKS, RN Vilchis, Tona LABOR, NT Lynwood Setter, [facility]   Problem List:   Principal Problem:   Diverticular disease Active Problems:   A-fib [facility])   Restless leg   ABLA (acute blood loss anemia)   Need for emotional support   Acute urinary retention   Mixed hyperlipidemia   Essential hypertension   Diverticular disease of left colon   History of DVT (deep vein thrombosis)   History of pulmonary embolus (PE)   Pre-diabetes   Presence of IVC filter   Stricture of sigmoid colon (HCC)   Protein-calorie malnutrition, severe   Hypokalemia   S/P percutaneous endoscopic gastrostomy (PEG) tube placement [facility])   Goals of care, counseling/discussion   Shock, during or resulting from a surgical procedure   05/08/2024  POST-OPERATIVE DIAGNOSIS:   COLOSTOMY FOR RESECTION, DESIRE FOR OSTOMY TAKEDOWN RECTAL STRICTURE HISTORY OF DIVERTICULITIS WITH PERFORATION & ABSCESS   PROCEDURE:   -ROBOTIC RECTOSIGMOID RESECTION (LAR) -TAKEDOWN OF END COLOSTOMY WITH ANASTOMOSIS -RESECTION OF SMALL INTESTINE WITH ANASTOMOSIS -SMALL BOWEL REPAIR -LYSIS OF ADHESIONS x 115 MINUTES (66% OF CASE),  -INTRAOPERATIVE ASSESSMENT OF TISSUE VASCULAR PERFUSION USING ICG (indocyanine green )  IMMUNOFLUORESCENCE,  -TRANSVERSUS ABDOMINIS PLANE (TAP) BLOCK - BILATERAL -FLEXIBLE SIGMOIDOSCOPY   SURGEON:  [name], MD   05/20/2024  Post procedural Dx: Post op abscess, multiple   Technically successful CT guided placed of a  10 Fr drainage catheter placement x 2  into the midline ventral and dorsal perirectal abscesses.     [name] LABOR, MD    05/28/2024  IMPRESSION: Satisfactory removal of the midline anterior abscess drainage catheter and placement of a new midline anterior pelvic drainage catheter as well as a posterior transgluteal drainage catheter in pelvic abscesses.     Electronically Signed   By: [name]    06/03/2024  POST-OPERATIVE DIAGNOSIS:   DELAYED COLORECTAL ANASTOMOTIC LEAK, NEED FOR FECAL DIVERSION CHRONIC PELVIC ABSCESS   PROCEDURE:   DIAGNOSTIC LAPAROSCOPY CONVERTED TO LAPAROTOMY DIVERTING LOOP TRANSVERSE COLOSTOMY DRAINAGE OF ABDOMINAL ABSCESS GASTROSTOMY TUBE PLACEMENT RECTAL TUBE PLACEMENT   SURGEON:  [name], MD   OR FINDINGS: Very dense concrete intraperitoneal and pelvic adhesions infraumbilically.  Bladder, small bowel, omentum walling off the lower pelvis with delayed anastomotic leak.   Near disruption of colorectal anastomosis by transanal examination.  Rectal tube placed through rectal stump to drain the pelvis more directly.   Diverting mid-transverse loop colostomy done.   Removal of old percutaneous abdominal and transgluteal drains.     Assessment [facility] Stay = 40 days) 14 Days Post-Op    Failure to thrive with delayed anastomotic leaks     Plan:  Delayed anastomotic leaks   Delayed colorectal anastomotic leak initially controlled with bowel rest and drains since patient declined colostomy diversion.    Then drain fell out & felt worse  despite drain replacement.    Acquiesced to diverting loop transverse colostomy (lower abdomen extremely fixed).  6/25 with very fixed abdomen.     Transanal diarrhea started 7/2.    Concern by this & CT scan that her ileal SBR anastomosis leaking into the pelvis c/w internal ileorectal fistula.  Uncontrolled transanal diarrhea.  Flexi-Seal ordered by me 7/3 to help divert and protect the skin  Trying to control diarrhea with 4 times daily Lomotil  & loperamide .  Add DTO BID 7/8  Low vol clears PO, but leave gastrostomy tube to gravity.  Full TPN  Severe malnutrition   Back on TPN for now.  Infection:  Abdomen switched to daptomycin  to meropenem .  I am presuming this is by infectious disease.  Recent left upper quadrant fluid collection drained 7/7.  Seems consistent with seroma/old hematoma.  Nothing on cultures yet.  Follow.  Midline wound superficial enough to do just packing for now   C/o Leg & back itching -No evid of rash nor weakness.    -Topical steroids.    -H2 blockage - Atarax .  I would make it PRN (as needed) and not scheduled since she is getting rather sedated on it scheduled    -would like to hold off on IV steroids given her delayed anastomotic leaks, severe malnutrition, chronic infections    Some recurrent atrial fibrillation and rapid rate.    PO/Gtube metoprolol  & amlodipine   Rate is controlled so do not see need for telemetry at this time.  Defer to medicine  Acute blood loss anemia in the setting of anemia of chronic disease.    Transfused 6/26, 7/2.    Suspect she had a rectus sheath hematoma bleeding on heparin .  That is held.    Hemoglobin more stable currently reassuring.    I think she has failed full anticoagulation again.  I would be very hesitant to restart full anticoagulation w heparin  ever again given her numerous severe bleeding episodes on that despite her history of PE/DVT (has IVC filter as a result of prior bleeding problems) and chronic A-fib.  Perhaps can consider prophylactic heparin  or enoxaparin  if hemoglobin stable and no procedures for 48 hours.  See what internal  medicine thinks.  No easy solutions here  No evidence of coagulopathy or thrombocytopenia.  Of note, she had a prior workup and follow-up that did not show any coagulopathy by Dr. Lanny with hematology.  May reconsult if persistent oozing/bleeding  IV iron .    Enteral iron  with vitamin C  6/30.  I would not trust enteral route to be totally adequate but may help  History of urinary retention.  See if we can switch from Foley to pure wick in hopes of avoiding UTI issues.  If that fails then replace Foley for quite some time.  -monitor electrolytes & replace as needed.  Keep K>4, Mg>2, Phos>3.  Replace phosphorus.  Pharmacy following and most likely will adjust TPN as well  -Diuresis as tolerated.  Trying to get down to her presurgical weight (~62kg)  if possible.  Follow-up with anemia needing transfusion.  -Pain control.  Scheduled Tylenol .  More alert so I think we can increase her oxycodone  dose since she struggles with pain.  10 mg at night to help her sleep.    VTE prophylaxis.  Patient had severe bleeding issues on anticoagulation numerous hospitalizations.  Would avoid.  Trying to SCDs.  Already has IVC filter in place for prior PE/DVTs.  Patient has some leg swelling -standard of care  is considering duplex of lower extremities but will defer to medicine.  Very guarded on doing anticoagulation on her yet again numerous bleeding complications she has had  -mobilize as tolerated to help recovery.  Try and get therapists involved to help her mobilize more.  Biggest challenge.    I updated the patient's status to the patient and nurse.  Called discussed with the patient's granddaughter who closely follows her 170/1.  Recommendations were made.  Questions were answered.  They expressed understanding & appreciation.  -Disposition: TBD.   There is no way she can go straight home.  She will need skilled nursing facility and rehab in the past.    Agree with palliative care.  I do not think we  have a final point of futility but options are limited - neither I nor granddaughter desire prolonged suffering either.    See if we can turn things around in this coming week.     I reviewed last 24 h vitals and pain scores, last 48 h intake and output, last 24 h labs and trends, and last 24 h imaging results.  I have reviewed this patient's available data, including medical history, events of note, test results, etc as part of my evaluation.   A significant portion of that time was spent in counseling. Care during the described time interval was provided by me.  This care required moderate level of medical decision making.  06/17/2024    Subjective: (Chief complaint)  Drain placed.  Looks primarily serous/serosanguineous.  Antibiotics adjusted  Patient resting in bed wanting just to sip drinks only.  Does not want to get out of bed.  Does not want to take any pills swallowing soap and placing through the gastrostomy tube.  Nursing just outside room.  Still with thin output of rectal tube.  Nurse is pretty certain patient has been getting the antidiarrheals.    Objective:  Vital signs:  Vitals:   06/16/24 1343 06/16/24 2049 06/17/24 0203 06/17/24 0445  BP: 128/83 131/87 137/81 (!) 133/93  Pulse: 79 87 96 84  Resp: 18 18  16   Temp: 98.1 F (36.7 C) 98.1 F (36.7 C)  97.9 F (36.6 C)  TempSrc: Oral Oral  Oral  SpO2: 100% 100% 95% 96%  Weight:      Height:        Last BM Date : 06/16/24  Intake/Output   Yesterday:  07/07 0701 - 07/08 0700 In: 2280.4 [P.O.:320; I.V.:1355.4; IV Piggyback:500] Out: 3155 [Urine:2000; Drains:455; Stool:700] This shift:  No intake/output data recorded.   Bowel function:  Flatus: YES  BM:  YES -thin dark green  Drain:  Left upper quadrant percutaneous tube 7/7 serosanguineous Leslie serous Gastrostomy tube 6/24 with thin bilious effluent and gravity bag Foley catheter with clear yellow urine Rectal tube with thin green  bilious effluent   Physical Exam:  General: Sleeping but does wake up.  Tired and sickly but not acutely in distress              Eyes: PERRL, normal EOM.  Sclera clear.  No icterus Neuro: CN II-XII intact w/o focal sensory/motor deficits.  No facial droop. Lymph: No head/neck/groin lymphadenopathy Psych:  No delerium/psychosis/paranoia.  Oriented x 4 HENT: Normocephalic, Mucus membranes moist.  No thrush.  Mild HOH Neck: Supple, No tracheal deviation.  No obvious thyromegaly.  Chest: No pain to chest wall compression.  Good respiratory excursion.  Mild audible wheezing CV:  Pulses intact.  Regular rhythm.  No major  extremity edema MS: Normal AROM mjr joints.  No obvious deformity  Abdomen:  Most of abdomen soft but left upper quadrant with firmness consistent with rectus sheath abdominal wall hematoma.  Mildy distended.  Mildly tender at incisions only.  No guarding.   Right upper quadrant mid transverse diverting loop colostomy pink with edema.  No gas nor stool Midline incision with some granulation. superificial Gastrostomy tube left upper quadrant without any leaking   GU: Foley in place with light yellow clear urine  Rectal: Flexi-Seal tube  - thin brown effluent.  Pruritus improved.  Skin warm dry. Ext:   No deformity.  No mjr edema.  No cyanosis Skin: No petechiae / purpurea.  No major sores.  Warm and dry    Results:   Cultures: Recent Results (from the past 720 hours)  MIC (1 Drug)-Abdominal drain abscess; 05/22/2024; Abdomen; MRSA; Daptomycin      Status: Abnormal   Collection Time: 05/22/24  1:54 PM   Specimen: Abdomen  Result Value Ref Range Status   Min Inhibitory Conc (1 Drug) Final report (A)  Corrected    Comment: (NOTE) Performed At: [facility] [address] [place], [place] [id number] Jennette Shorter MD Ey:[id number] CORRECTED ON 06/24 AT 0836: PREVIOUSLY REPORTED AS Preliminary report    Source ABSCESS  Final    Comment: Performed at [facility], [address], [place], [place] [address]  MIC Result     Status: Abnormal   Collection Time: 05/22/24  1:54 PM  Result Value Ref Range Status   Result 1 (MIC) Comment (A)  Final    Comment: (NOTE) Methicillin - resistant Staphylococcus aureus Identification performed by account, not confirmed by this laboratory. DAPTOMYCIN    <=1 UG/ML = SUSCEPTIBLE Performed At: [facility] [address] [place], [place] [id number] Jennette Shorter MD Ey:[id number]   Aerobic/Anaerobic Culture w Gram Stain (surgical/deep wound)     Status: None   Collection Time: 05/22/24  3:59 PM   Specimen: Abdomen  Result Value Ref Range Status   Specimen Description   Final    ABDOMEN Performed at [facility], [address], [place], [place] [address]    Special Requests   Final    ABDOMINAL DRAIN Performed at [facility], [address] [place], [place] [address]    Gram Stain   Final    ABUNDANT WBC PRESENT, PREDOMINANTLY PMN RARE GRAM POSITIVE COCCI RARE YEAST WITH PSEUDOHYPHAE    Culture   Final    MODERATE PSEUDOMONAS AERUGINOSA MODERATE STAPHYLOCOCCUS AUREUS SUSCEPTIBILITIES PERFORMED ON PREVIOUS CULTURE WITHIN THE LAST 5 DAYS. MODERATE CANDIDA ALBICANS SEE SEPARATE REPORT NO ANAEROBES ISOLATED Performed at [facility], [address], [place], [place] [address]    Report Status 06/04/2024 FINAL  Final   Organism ID, Bacteria PSEUDOMONAS AERUGINOSA  Final      Susceptibility   Pseudomonas aeruginosa - MIC*    CEFTAZIDIME 4 SENSITIVE Sensitive     CIPROFLOXACIN <=0.25 SENSITIVE Sensitive     GENTAMICIN 4 SENSITIVE Sensitive     IMIPENEM 2 SENSITIVE Sensitive     PIP/TAZO <=4 SENSITIVE Sensitive ug/mL    CEFEPIME 2 SENSITIVE Sensitive     * MODERATE PSEUDOMONAS AERUGINOSA  Aerobic/Anaerobic Culture w Gram Stain (surgical/deep wound)     Status: None   Collection Time: 05/22/24  3:59 PM   Specimen: Back  Result Value  Ref Range Status   Specimen Description   Final    BACK Performed  at [facility], [address] [place], [place] [address]    Special Requests   Final    TRANSGLUTEAL PERCUTANEOUS IR DRAIN Performed at [facility], [address] [place], [place] [address]    Gram Stain   Final    NO WBC SEEN ABUNDANT GRAM NEGATIVE RODS RARE GRAM POSITIVE COCCI IN PAIRS RARE BUDDING YEAST SEEN    Culture   Final    ABUNDANT PSEUDOMONAS AERUGINOSA ABUNDANT METHICILLIN RESISTANT STAPHYLOCOCCUS AUREUS ABUNDANT ENTEROCOCCUS FAECALIS ABUNDANT CANDIDA ALBICANS NO ANAEROBES ISOLATED Sent to [facility] for further susceptibility testing. Performed at [facility], [address], [place], [place] [address]    Report Status 05/27/2024 FINAL  Final   Organism ID, Bacteria PSEUDOMONAS AERUGINOSA  Final   Organism ID, Bacteria METHICILLIN RESISTANT STAPHYLOCOCCUS AUREUS  Final   Organism ID, Bacteria ENTEROCOCCUS FAECALIS  Final      Susceptibility   Enterococcus faecalis - MIC*    AMPICILLIN <=2 SENSITIVE Sensitive     VANCOMYCIN  1 SENSITIVE Sensitive     GENTAMICIN SYNERGY SENSITIVE Sensitive     * ABUNDANT ENTEROCOCCUS FAECALIS   Methicillin resistant staphylococcus aureus - MIC*    CIPROFLOXACIN >=8 RESISTANT Resistant     ERYTHROMYCIN  >=8 RESISTANT Resistant     GENTAMICIN <=0.5 SENSITIVE Sensitive     OXACILLIN >=4 RESISTANT Resistant     TETRACYCLINE <=1 SENSITIVE Sensitive     VANCOMYCIN  1 SENSITIVE Sensitive     TRIMETH/SULFA >=320 RESISTANT Resistant     CLINDAMYCIN <=0.25 SENSITIVE Sensitive     RIFAMPIN <=0.5 SENSITIVE Sensitive     Inducible Clindamycin NEGATIVE Sensitive     LINEZOLID 2 SENSITIVE Sensitive     * ABUNDANT METHICILLIN RESISTANT STAPHYLOCOCCUS AUREUS   Pseudomonas aeruginosa - MIC*    CEFTAZIDIME 4 SENSITIVE Sensitive     CIPROFLOXACIN <=0.25 SENSITIVE Sensitive     GENTAMICIN 4 SENSITIVE Sensitive     IMIPENEM 2  SENSITIVE Sensitive     PIP/TAZO <=4 SENSITIVE Sensitive ug/mL    CEFEPIME 2 SENSITIVE Sensitive     * ABUNDANT PSEUDOMONAS AERUGINOSA  Yeast Susceptibilities     Status: None   Collection Time: 05/22/24  3:59 PM  Result Value Ref Range Status   SOURCE CANDIDA ALBICAN ABD ABSC  Final    Comment: Performed at [facility], [address], [place], [place] [address]   Organism ID, Yeast Candida albicans  Final    Comment: (NOTE) Identification performed by account, not confirmed by this laboratory.    Amphotericin B MIC 0.25 ug/mL  Final    Comment: (NOTE) Breakpoints have been established for only some organism-drug combinations as indicated. This test was developed and its performance characteristics determined by [facility]. It has not been cleared or approved by the Food and Drug Administration.    Anidulafungin MIC Comment  Final    Comment: (NOTE) 0.03 ug/mL Susceptible Breakpoints have been established for only some organism-drug combinations as indicated. This test was developed and its performance characteristics determined by [facility]. It has not been cleared or approved by the Food and Drug Administration.    Caspofungin MIC Comment  Final    Comment: (NOTE) 0.12 ug/mL Susceptible Breakpoints have been established for only some organism-drug combinations as indicated. This test was developed and its performance characteristics determined by [facility]. It has not been cleared or approved by the Food and Drug Administration.    Fluconazole  Islt MIC 1.0 ug/mL Susceptible  Final  Comment: (NOTE) Breakpoints have been established for only some organism-drug combinations as indicated. This test was developed and its performance characteristics determined by [facility]. It has not been cleared or approved by the Food and Drug Administration.    ISAVUCONAZOLE MIC 0.008 ug/mL or less  Final    Comment: (NOTE) This test was developed and its performance  characteristics determined by [facility]. It has not been cleared or approved by the Food and Drug Administration.    Itraconazole MIC 0.06 ug/mL  Final    Comment: (NOTE) Breakpoints have been established for only some organism-drug combinations as indicated. This test was developed and its performance characteristics determined by [facility]. It has not been cleared or approved by the Food and Drug Administration.    Micafungin  MIC Comment  Final    Comment: (NOTE) 0.008 ug/mL or less, Susceptible Breakpoints have been established for only some organism-drug combinations as indicated. This test was developed and its performance characteristics determined by [facility]. It has not been cleared or approved by the Food and Drug Administration.    Posaconazole MIC 0.06 ug/mL  Final    Comment: (NOTE) Breakpoints have been established for only some organism-drug combinations as indicated. This test was developed and its performance characteristics determined by [facility]. It has not been cleared or approved by the Food and Drug Administration.    REZAFUNGIN MIC Comment  Final    Comment: (NOTE) 0.15 ug/mL Susceptible This test was developed and its performance characteristics determined by [facility]. It has not been cleared or approved by the Food and Drug Administration.    Voriconazole MIC Comment  Final    Comment: (NOTE) 0.15 ug/mL Susceptible Breakpoints have been established for only some organism-drug combinations as indicated. This test was developed and its performance characteristics determined by [facility]. It has not been cleared or approved by the Food and Drug Administration. Performed At: [facility] [address] [place], [place] [id number] Jennette Shorter MD Ey:[id number]   Aerobic/Anaerobic Culture w Gram Stain (surgical/deep wound)     Status: None   Collection Time: 05/27/24  5:19 PM   Specimen: Abscess  Result Value Ref Range Status   Specimen  Description   Final    ABSCESS ABDOMEN Performed at [facility], [address] [place], [place] [address]    Special Requests   Final    NONE Performed at [facility], [address] [place], [place] [address]    Gram Stain   Final    ABUNDANT WBC PRESENT, PREDOMINANTLY PMN NO ORGANISMS SEEN    Culture   Final    RARE PSEUDOMONAS AERUGINOSA RARE CANDIDA ALBICANS NO ANAEROBES ISOLATED Performed at [facility], [address], [place], [place] [address]    Report Status 06/01/2024 FINAL  Final   Organism ID, Bacteria PSEUDOMONAS AERUGINOSA  Final      Susceptibility   Pseudomonas aeruginosa - MIC*    CEFTAZIDIME 4 SENSITIVE Sensitive     CIPROFLOXACIN <=0.25 SENSITIVE Sensitive     GENTAMICIN 4 SENSITIVE Sensitive     IMIPENEM 2 SENSITIVE Sensitive     PIP/TAZO <=4 SENSITIVE Sensitive ug/mL    CEFEPIME 2 SENSITIVE Sensitive     * RARE PSEUDOMONAS AERUGINOSA  Surgical pcr screen     Status: Abnormal   Collection Time: 06/02/24  7:55 PM   Specimen: Nasal Mucosa; Nasal Swab  Result Value Ref Range Status   MRSA, PCR POSITIVE (A) NEGATIVE Final   Staphylococcus aureus POSITIVE (A)  NEGATIVE Final    Comment: (NOTE) The Xpert SA Assay (FDA approved for NASAL specimens in patients 50 years of age and older), is one component of a comprehensive surveillance program. It is not intended to diagnose infection nor to guide or monitor treatment. Performed at [facility], [address], [place], [place] [address]   Aerobic/Anaerobic Culture w Gram Stain (surgical/deep wound)     Status: None   Collection Time: 06/11/24  4:12 PM   Specimen: Abdomen; Abdominal Fluid  Result Value Ref Range Status   Specimen Description   Final    ABDOMEN Performed at [facility], [address], [place], [place] [address]    Special Requests   Final    ABDOMINAL FLUID Performed at [facility], [address] [place], [place] [address]    Gram Stain   Final    ABUNDANT WBC PRESENT, PREDOMINANTLY PMN ABUNDANT GRAM NEGATIVE RODS FEW GRAM POSITIVE COCCI    Culture   Final    MODERATE PSEUDOMONAS AERUGINOSA Two isolates with different morphologies were identified as the same organism.The most resistant organism was reported. FEW METHICILLIN RESISTANT STAPHYLOCOCCUS AUREUS MODERATE ENTEROCOCCUS FAECALIS MODERATE CLOSTRIDIUM INNOCUUM Standardized susceptibility testing for this organism is not available. Performed at [facility], [address], [place], [place] [address]    Report Status 06/16/2024 FINAL  Final   Organism ID, Bacteria PSEUDOMONAS AERUGINOSA  Final   Organism ID, Bacteria ENTEROCOCCUS FAECALIS  Final   Organism ID, Bacteria METHICILLIN RESISTANT STAPHYLOCOCCUS AUREUS  Final      Susceptibility   Enterococcus faecalis - MIC*    AMPICILLIN <=2 SENSITIVE Sensitive     VANCOMYCIN  1 SENSITIVE Sensitive     GENTAMICIN SYNERGY SENSITIVE Sensitive     * MODERATE ENTEROCOCCUS FAECALIS   Methicillin resistant staphylococcus aureus - MIC*    CIPROFLOXACIN >=8 RESISTANT Resistant     ERYTHROMYCIN  >=8 RESISTANT Resistant     GENTAMICIN <=0.5 SENSITIVE Sensitive     OXACILLIN >=4 RESISTANT Resistant     TETRACYCLINE <=1 SENSITIVE Sensitive     VANCOMYCIN  1 SENSITIVE Sensitive     TRIMETH/SULFA >=320 RESISTANT Resistant     CLINDAMYCIN <=0.25 SENSITIVE Sensitive     RIFAMPIN <=0.5 SENSITIVE Sensitive     Inducible Clindamycin NEGATIVE Sensitive     LINEZOLID 2 SENSITIVE Sensitive     * FEW METHICILLIN RESISTANT STAPHYLOCOCCUS AUREUS   Pseudomonas aeruginosa - MIC*    CEFTAZIDIME >=64 RESISTANT Resistant     CIPROFLOXACIN 0.5 SENSITIVE Sensitive     GENTAMICIN 4 SENSITIVE Sensitive     IMIPENEM 2 SENSITIVE Sensitive     * MODERATE PSEUDOMONAS AERUGINOSA  Aerobic/Anaerobic Culture w Gram Stain (surgical/deep wound)     Status: None (Preliminary  result)   Collection Time: 06/16/24  1:08 PM   Specimen: Abscess  Result Value Ref Range Status   Specimen Description   Final    ABSCESS ABDOMEN Performed at [facility], [address], [place], [place] [address]    Special Requests   Final    NONE Performed at [facility], [address], [place], [place] [address]    Gram Stain NO WBC SEEN NO ORGANISMS SEEN   Final   Culture   Final    NO GROWTH < 12 HOURS Performed at [facility], [address], [place], [place] [address]    Report Status PENDING  Incomplete    Labs: Results  for orders placed or performed during the hospital encounter of 05/08/24 (from the past 48 hours)  Glucose, capillary     Status: Abnormal   Collection Time: 06/15/24 11:51 AM  Result Value Ref Range   Glucose-Capillary 131 (H) 70 - 99 mg/dL    Comment: Glucose reference range applies only to samples taken after fasting for at least 8 hours.  Glucose, capillary     Status: Abnormal   Collection Time: 06/15/24  4:23 PM  Result Value Ref Range   Glucose-Capillary 145 (H) 70 - 99 mg/dL    Comment: Glucose reference range applies only to samples taken after fasting for at least 8 hours.  Glucose, capillary     Status: Abnormal   Collection Time: 06/15/24  6:08 PM  Result Value Ref Range   Glucose-Capillary 124 (H) 70 - 99 mg/dL    Comment: Glucose reference range applies only to samples taken after fasting for at least 8 hours.  Glucose, capillary     Status: Abnormal   Collection Time: 06/15/24  7:26 PM  Result Value Ref Range   Glucose-Capillary 115 (H) 70 - 99 mg/dL    Comment: Glucose reference range applies only to samples taken after fasting for at least 8 hours.  Glucose, capillary     Status: Abnormal   Collection Time: 06/15/24 11:18 PM  Result Value Ref Range   Glucose-Capillary 132 (H) 70 - 99 mg/dL    Comment: Glucose reference range applies only to samples taken after fasting for at  least 8 hours.  Glucose, capillary     Status: Abnormal   Collection Time: 06/16/24  3:23 AM  Result Value Ref Range   Glucose-Capillary 127 (H) 70 - 99 mg/dL    Comment: Glucose reference range applies only to samples taken after fasting for at least 8 hours.  CK     Status: Abnormal   Collection Time: 06/16/24  3:35 AM  Result Value Ref Range   Total CK 29 (L) 38 - 234 U/L    Comment: Performed at [facility], [address], [place], [place] [address]  CBC     Status: Abnormal   Collection Time: 06/16/24  3:35 AM  Result Value Ref Range   WBC 10.8 (H) 4.0 - 10.5 K/uL   RBC 3.67 (L) 3.87 - 5.11 MIL/uL   Hemoglobin 10.5 (L) 12.0 - 15.0 g/dL   HCT 66.1 (L) 63.9 - 53.9 %   MCV 92.1 80.0 - 100.0 fL   MCH 28.6 26.0 - 34.0 pg   MCHC 31.1 30.0 - 36.0 g/dL   RDW 81.3 (H) 88.4 - 84.4 %   Platelets 413 (H) 150 - 400 K/uL   nRBC 0.0 0.0 - 0.2 %    Comment: Performed at [facility], [address], [place], [place] [address]  Comprehensive metabolic panel     Status: Abnormal   Collection Time: 06/16/24  3:35 AM  Result Value Ref Range   Sodium 133 (L) 135 - 145 mmol/L   Potassium 3.8 3.5 - 5.1 mmol/L   Chloride 101 98 - 111 mmol/L   CO2 23 22 - 32 mmol/L   Glucose, Bld 119 (H) 70 - 99 mg/dL    Comment: Glucose reference range applies only to samples taken after fasting for at least 8 hours.   BUN 16 8 - 23 mg/dL   Creatinine, Ser 9.56 (L) 0.44 - 1.00 mg/dL   Calcium  8.1 (L) 8.9 - 10.3 mg/dL  Total Protein 7.0 6.5 - 8.1 g/dL   Albumin  1.9 (L) 3.5 - 5.0 g/dL   AST 23 15 - 41 U/L   ALT 17 0 - 44 U/L   Alkaline Phosphatase 299 (H) 38 - 126 U/L   Total Bilirubin 1.1 0.0 - 1.2 mg/dL   GFR, Estimated >39 >39 mL/min    Comment: (NOTE) Calculated using the CKD-EPI Creatinine Equation (2021)    Anion gap 9 5 - 15    Comment: Performed at [facility], [address], [place], [place] [address]  Magnesium      Status: None    Collection Time: 06/16/24  3:35 AM  Result Value Ref Range   Magnesium  2.0 1.7 - 2.4 mg/dL    Comment: Performed at [facility], [address] [place], [place] [address]  Phosphorus     Status: Abnormal   Collection Time: 06/16/24  3:35 AM  Result Value Ref Range   Phosphorus 2.4 (L) 2.5 - 4.6 mg/dL    Comment: Performed at [facility], [address], [place], [place] [address]  Triglycerides     Status: Abnormal   Collection Time: 06/16/24  3:35 AM  Result Value Ref Range   Triglycerides 182 (H) <150 mg/dL    Comment: Performed at [facility], [address] [place], [place] [address]  Differential     Status: Abnormal   Collection Time: 06/16/24  3:35 AM  Result Value Ref Range   Neutrophils Relative % 68 %   Neutro Abs 7.2 1.7 - 7.7 K/uL   Lymphocytes Relative 20 %   Lymphs Abs 2.1 0.7 - 4.0 K/uL   Monocytes Relative 6 %   Monocytes Absolute 0.6 0.1 - 1.0 K/uL   Eosinophils Relative 3 %   Eosinophils Absolute 0.3 0.0 - 0.5 K/uL   Basophils Relative 1 %   Basophils Absolute 0.1 0.0 - 0.1 K/uL   Immature Granulocytes 2 %   Abs Immature Granulocytes 0.18 (H) 0.00 - 0.07 K/uL    Comment: Performed at [facility], [address], [place], [place] [address]  Glucose, capillary     Status: Abnormal   Collection Time: 06/16/24 11:36 AM  Result Value Ref Range   Glucose-Capillary 142 (H) 70 - 99 mg/dL    Comment: Glucose reference range applies only to samples taken after fasting for at least 8 hours.  Aerobic/Anaerobic Culture w Gram Stain (surgical/deep wound)     Status: None (Preliminary result)   Collection Time: 06/16/24  1:08 PM   Specimen: Abscess  Result Value Ref Range   Specimen Description      ABSCESS ABDOMEN Performed at [facility], [address], [place], [place] [address]    Special Requests      NONE Performed at [facility], [address] [place], [place] [address]    Gram Stain NO WBC SEEN NO ORGANISMS SEEN     Culture      NO GROWTH < 12 HOURS Performed at [facility], [address], [place], [place] [address]    Report Status PENDING   Glucose, capillary     Status: Abnormal   Collection Time: 06/16/24  6:14 PM  Result Value Ref Range   Glucose-Capillary 126 (H) 70 - 99 mg/dL    Comment: Glucose reference range applies only to samples taken after fasting for at least 8 hours.  Glucose, capillary  Status: Abnormal   Collection Time: 06/16/24 11:46 PM  Result Value Ref Range   Glucose-Capillary 148 (H) 70 - 99 mg/dL    Comment: Glucose reference range applies only to samples taken after fasting for at least 8 hours.  CBC     Status: Abnormal   Collection Time: 06/17/24  4:45 AM  Result Value Ref Range   WBC 9.5 4.0 - 10.5 K/uL   RBC 3.58 (L) 3.87 - 5.11 MIL/uL   Hemoglobin 10.1 (L) 12.0 - 15.0 g/dL   HCT 67.1 (L) 63.9 - 53.9 %   MCV 91.6 80.0 - 100.0 fL   MCH 28.2 26.0 - 34.0 pg   MCHC 30.8 30.0 - 36.0 g/dL   RDW 81.6 (H) 88.4 - 84.4 %   Platelets 426 (H) 150 - 400 K/uL   nRBC 0.0 0.0 - 0.2 %    Comment: Performed at [facility], [address] [place], [place] [address]  Basic metabolic panel with GFR     Status: Abnormal   Collection Time: 06/17/24  4:45 AM  Result Value Ref Range   Sodium 134 (L) 135 - 145 mmol/L   Potassium 4.0 3.5 - 5.1 mmol/L   Chloride 103 98 - 111 mmol/L   CO2 25 22 - 32 mmol/L   Glucose, Bld 126 (H) 70 - 99 mg/dL    Comment: Glucose reference range applies only to samples taken after fasting for at least 8 hours.   BUN 18 8 - 23 mg/dL   Creatinine, Ser 9.63 (L) 0.44 - 1.00 mg/dL   Calcium  8.2 (L) 8.9 - 10.3 mg/dL   GFR, Estimated >39 >39 mL/min    Comment: (NOTE) Calculated using the CKD-EPI Creatinine Equation (2021)    Anion gap 6 5 - 15    Comment: Performed at [facility], [address] [place], [place]  [address]  Magnesium      Status: None   Collection Time: 06/17/24  4:45 AM  Result Value Ref Range   Magnesium  2.0 1.7 - 2.4 mg/dL    Comment: Performed at [facility], [address] [place], [place] [address]  Phosphorus     Status: None   Collection Time: 06/17/24  4:45 AM  Result Value Ref Range   Phosphorus 2.8 2.5 - 4.6 mg/dL    Comment: Performed at [facility], [address], [place], [place] [address]  Glucose, capillary     Status: Abnormal   Collection Time: 06/17/24  6:04 AM  Result Value Ref Range   Glucose-Capillary 132 (H) 70 - 99 mg/dL    Comment: Glucose reference range applies only to samples taken after fasting for at least 8 hours.    Imaging / Studies: VAS US  LOWER EXTREMITY VENOUS (DVT) Result Date: 06/16/2024  Lower Venous DVT Study Patient Name:  TRESSIE RAGIN  Date of Exam:   06/16/2024 Medical Rec #: [id number]       Accession #:    [id number] Date of Birth: 07-Dec-1941      Patient Gender: F Patient Age:   48 years Exam Location:  [facility] Procedure:      VAS US  LOWER EXTREMITY VENOUS (DVT) Referring Phys: RALPH NETTEY --------------------------------------------------------------------------------  Indications: Edema. Extensive hospital stay, intolerant to anticoagulants. Other Indications: Large bowel obstruction, status post multiple abdominal                    surgeries, including colostomy and colostomy reversal.  Risk Factors: DVT 10/04/23 left popliteal, posterior tibial, peroneal, and gastroc veins History of PE. Patient had IVC filter placement 10/05/23 by IR. Atrial fibrillation. Comparison Study: Prior bilateral LEV done 10/04/2023 Performing Technologist: Alberta Lis RVS  Examination Guidelines: A complete evaluation includes B-mode imaging, spectral Doppler, color Doppler, and power Doppler as needed of all accessible portions of each vessel. Bilateral testing is considered an integral part of a complete  examination. Limited examinations for reoccurring indications may be performed as noted. The reflux portion of the exam is performed with the patient in reverse Trendelenburg.  +---------+---------------+---------+-----------+----------+--------------+ RIGHT    CompressibilityPhasicitySpontaneityPropertiesThrombus Aging +---------+---------------+---------+-----------+----------+--------------+ CFV      Full           Yes      Yes                                 +---------+---------------+---------+-----------+----------+--------------+ SFJ      Full                                                        +---------+---------------+---------+-----------+----------+--------------+ FV Prox  Full           Yes      Yes                                 +---------+---------------+---------+-----------+----------+--------------+ FV Mid   Full           Yes      Yes                                 +---------+---------------+---------+-----------+----------+--------------+ FV DistalFull                                                        +---------+---------------+---------+-----------+----------+--------------+ PFV      Full           Yes      Yes                                 +---------+---------------+---------+-----------+----------+--------------+ POP      Full           Yes      Yes                                 +---------+---------------+---------+-----------+----------+--------------+ PTV      Full                                                        +---------+---------------+---------+-----------+----------+--------------+ PERO     Full                                                        +---------+---------------+---------+-----------+----------+--------------+  Soleal   Full                                                        +---------+---------------+---------+-----------+----------+--------------+ Gastroc  Full                                                         +---------+---------------+---------+-----------+----------+--------------+ SSV      Full                                                        +---------+---------------+---------+-----------+----------+--------------+   +---------+---------------+---------+-----------+----------+--------------+ LEFT     CompressibilityPhasicitySpontaneityPropertiesThrombus Aging +---------+---------------+---------+-----------+----------+--------------+ CFV      Full           Yes      Yes                                 +---------+---------------+---------+-----------+----------+--------------+ SFJ      Full                                                        +---------+---------------+---------+-----------+----------+--------------+ FV Prox  Full                                                        +---------+---------------+---------+-----------+----------+--------------+ FV Mid   Full           Yes      Yes                                 +---------+---------------+---------+-----------+----------+--------------+ FV DistalFull           Yes      No                                  +---------+---------------+---------+-----------+----------+--------------+ PFV      Full                                                        +---------+---------------+---------+-----------+----------+--------------+ POP      Full           Yes      Yes                                 +---------+---------------+---------+-----------+----------+--------------+  PTV      Partial                                                     +---------+---------------+---------+-----------+----------+--------------+ PERO     Partial                                      Chronic        +---------+---------------+---------+-----------+----------+--------------+ Soleal                                                Chronic         +---------+---------------+---------+-----------+----------+--------------+ Gastroc  Full                                                        +---------+---------------+---------+-----------+----------+--------------+     Summary: RIGHT: - There is no evidence of deep vein thrombosis in the lower extremity.  - No cystic structure found in the popliteal fossa. interstitial edema noted in popliteal fossa and throughout calf  LEFT: - Findings consistent with chronic deep vein thrombosis involving the left posterior tibial veins, and left peroneal veins.  - There is no evidence of acute deep vein thrombosis in the lower extremity.  Interstitial edema noted in popliteal fossa and throughout calf.  *See table(s) above for measurements and observations. Electronically signed by Fonda Rim on 06/16/2024 at 5:50:11 PM.    Final    CT GUIDED VISCERAL FLUID DRAIN BY PERC CATH Result Date: 06/16/2024 INDICATION: POSTOP LEFT ABDOMINAL FLUID COLLECTION EXAM: CT DRAINAGE OF THE LEFT ANTERIOR ABDOMINAL FLUID COLLECTION MEDICATIONS: The patient is currently admitted to the hospital and receiving intravenous antibiotics. The antibiotics were administered within an appropriate time frame prior to the initiation of the procedure. ANESTHESIA/SEDATION: Moderate (conscious) sedation was employed during this procedure. A total of Versed  0 mg and Fentanyl  50 mcg was administered intravenously by the radiology nurse. Total intra-service moderate Sedation Time: 9 minutes. The patient's level of consciousness and vital signs were monitored continuously by radiology nursing throughout the procedure under my direct supervision. COMPLICATIONS: None immediate. PROCEDURE: Informed written consent was obtained from the patient after a thorough discussion of the procedural risks, benefits and alternatives. All questions were addressed. Maximal Sterile Barrier Technique was utilized including caps, mask, sterile gowns, sterile gloves,  sterile drape, hand hygiene and skin antiseptic. A timeout was performed prior to the initiation of the procedure. previous imaging reviewed. patient positioned supine. noncontrast localization CT performed. the left anterior abdominal fluid collection was localized and marked for a left lateral approach. Under sterile conditions and local anesthesia, the 18 gauge trocar needle was advanced from a anterior left lateral approach into the fluid collection. Needle position confirmed with CT. Guidewire inserted and confirmed within the collection. Tract dilatation performed to insert a 10 [place] drain. Drain catheter position confirmed with CT. Serosanguineous fluid aspirated and sent for culture. Catheter secured with a silk suture and a sterile dressing. External suction  bulb applied. Patient tolerated the procedure well. No immediate complication. IMPRESSION: Successful CT-guided drainage of the left anterior abdominal postop fluid collection. Culture sent. Electronically Signed   By: CHRISTELLA.  Shick M.D.   On: 06/16/2024 16:03         Medications / Allergies: per chart  Antibiotics: Anti-infectives (From admission, onward)    Start     Dose/Rate Route Frequency Ordered Stop   06/15/24 1600  meropenem  (MERREM ) 1 g in sodium chloride  0.9 % 100 mL IVPB        1 g 200 mL/hr over 30 Minutes Intravenous Every 8 hours 06/15/24 1510     06/15/24 1600  DAPTOmycin  (CUBICIN ) IVPB 700 mg/100mL premix        700 mg 200 mL/hr over 30 Minutes Intravenous Daily 06/15/24 1510     06/11/24 1545  piperacillin -tazobactam (ZOSYN ) IVPB 3.375 g  Status:  Discontinued        3.375 g 12.5 mL/hr over 240 Minutes Intravenous Every 8 hours 06/11/24 1457 06/15/24 1510   06/09/24 1745  DAPTOmycin  (CUBICIN ) IVPB 500 mg/50mL premix  Status:  Discontinued        500 mg 100 mL/hr over 30 Minutes Intravenous Daily 06/09/24 1647 06/13/24 1539   06/06/24 1000  micafungin  (MYCAMINE ) 100 mg in sodium chloride  0.9 % 100 mL IVPB   Status:  Discontinued        100 mg 105 mL/hr over 1 Hours Intravenous Every 24 hours 06/05/24 1349 06/12/24 1142   06/04/24 1400  piperacillin -tazobactam (ZOSYN ) IVPB 3.375 g        3.375 g 12.5 mL/hr over 240 Minutes Intravenous Every 8 hours 06/04/24 0738 06/09/24 2124   06/04/24 1400  DAPTOmycin  (CUBICIN ) IVPB 500 mg/54mL premix        8 mg/kg  60.9 kg 100 mL/hr over 30 Minutes Intravenous Daily 06/04/24 0738 06/10/24 0126   06/03/24 1000  neomycin  (MYCIFRADIN ) tablet 500 mg  Status:  Discontinued        500 mg Oral 3 times daily 06/03/24 0935 06/03/24 1555   06/03/24 1000  metroNIDAZOLE  (FLAGYL ) tablet 500 mg  Status:  Discontinued       Note to Pharmacy: Take 2 pills (=1000mg ) by mouth at 1pm, 3pm, and 10pm the day before your colorectal operation   500 mg Oral 3 times daily 06/03/24 0935 06/03/24 1555   05/29/24 1500  DAPTOmycin  (CUBICIN ) IVPB 500 mg/50mL premix  Status:  Discontinued        8 mg/kg  60.9 kg 100 mL/hr over 30 Minutes Intravenous Daily 05/29/24 1359 06/04/24 0738   05/28/24 2100  vancomycin  (VANCOREADY) IVPB 750 mg/150 mL  Status:  Discontinued        750 mg 150 mL/hr over 60 Minutes Intravenous Every 12 hours 05/28/24 1419 05/29/24 1359   05/27/24 0800  vancomycin  (VANCOCIN ) IVPB 1000 mg/200 mL premix  Status:  Discontinued        1,000 mg 200 mL/hr over 60 Minutes Intravenous Every 24 hours 05/26/24 1144 05/28/24 1419   05/23/24 1400  piperacillin -tazobactam (ZOSYN ) IVPB 3.375 g  Status:  Discontinued        3.375 g 12.5 mL/hr over 240 Minutes Intravenous Every 8 hours 05/23/24 0804 06/04/24 0738   05/23/24 1200  micafungin  (MYCAMINE ) 100 mg in sodium chloride  0.9 % 100 mL IVPB        100 mg 105 mL/hr over 1 Hours Intravenous Every 24 hours 05/23/24 0755 06/05/24 1221   05/22/24 0800  vancomycin  (VANCOCIN ) IVPB  1000 mg/200 mL premix  Status:  Discontinued        1,000 mg 200 mL/hr over 60 Minutes Intravenous Every 24 hours 05/21/24 0634 05/26/24 0806    05/21/24 0730  vancomycin  (VANCOREADY) IVPB 1250 mg/250 mL        1,250 mg 166.7 mL/hr over 90 Minutes Intravenous  Once 05/21/24 0634 05/21/24 1043   05/19/24 1400  piperacillin -tazobactam (ZOSYN ) IVPB 3.375 g  Status:  Discontinued        3.375 g 12.5 mL/hr over 240 Minutes Intravenous Every 8 hours 05/19/24 1303 05/23/24 0804   05/09/24 0900  erythromycin  250 mg in sodium chloride  0.9 % 100 mL IVPB        250 mg 100 mL/hr over 60 Minutes Intravenous Every 8 hours 05/09/24 0732 05/11/24 0044   05/08/24 2200  cefoTEtan  (CEFOTAN ) 2 g in sodium chloride  0.9 % 100 mL IVPB        2 g 200 mL/hr over 30 Minutes Intravenous Every 12 hours 05/08/24 1759 05/09/24 0749   05/08/24 1400  neomycin  (MYCIFRADIN ) tablet 1,000 mg  Status:  Discontinued       Placed in And Linked Group   1,000 mg Oral 3 times per day 05/08/24 1119 05/08/24 1120   05/08/24 1400  metroNIDAZOLE  (FLAGYL ) tablet 1,000 mg  Status:  Discontinued       Placed in And Linked Group   1,000 mg Oral 3 times per day 05/08/24 1119 05/08/24 1120   05/08/24 1130  cefoTEtan  (CEFOTAN ) 2 g in sodium chloride  0.9 % 100 mL IVPB        2 g 200 mL/hr over 30 Minutes Intravenous On call to O.R. 05/08/24 1119 05/09/24 0749         Note: Portions of this report may have been transcribed using voice recognition software. Every effort was made to ensure accuracy; however, inadvertent computerized transcription errors may be present.   Any transcriptional errors that result from this process are unintentional.    Elspeth KYM Schultze, MD, FACS, MASCRS Esophageal, Gastrointestinal & Colorectal Surgery Robotic and Minimally Invasive Surgery  [facility] [address] [place], [place] [address] [phone] Fax [phone] Main  CONTACT INFORMATION: Weekday (9AM-5PM): Call [facility] main office at [phone] Weeknight (5PM-9AM) or Weekend/Holiday: Check EPIC Web Links tab &  use AMION (password  [email or web address]) for General Surgery [facility] coverage  Please, DO NOT use SecureChat  (it is not reliable communication to reach operating surgeons & will lead to a delay in care).   Epic staff messaging available for outptient concerns needing 1-2 business day response.      06/17/2024  7:18 AM

## 2024-06-18 ENCOUNTER — Inpatient Hospital Stay (HOSPITAL_COMMUNITY)

## 2024-06-18 DIAGNOSIS — K5790 Diverticulosis of intestine, part unspecified, without perforation or abscess without bleeding: Secondary | ICD-10-CM | POA: Diagnosis not present

## 2024-06-18 LAB — BASIC METABOLIC PANEL WITH GFR
Anion gap: 6 (ref 5–15)
BUN: 23 mg/dL (ref 8–23)
CO2: 26 mmol/L (ref 22–32)
Calcium: 8 mg/dL — ABNORMAL LOW (ref 8.9–10.3)
Chloride: 100 mmol/L (ref 98–111)
Creatinine, Ser: 0.38 mg/dL — ABNORMAL LOW (ref 0.44–1.00)
GFR, Estimated: >60 mL/min (ref >60)
Glucose, Bld: 127 mg/dL — ABNORMAL HIGH (ref 70–99)
Potassium: 4 mmol/L (ref 3.5–5.1)
Sodium: 132 mmol/L — ABNORMAL LOW (ref 135–145)

## 2024-06-18 LAB — GLUCOSE, CAPILLARY
Glucose-Capillary: 134 mg/dL — ABNORMAL HIGH (ref 70–99)
Glucose-Capillary: 158 mg/dL — ABNORMAL HIGH (ref 70–99)
Glucose-Capillary: 131 mg/dL — ABNORMAL HIGH (ref 70–99)

## 2024-06-18 LAB — PHOSPHORUS: Phosphorus: 3.2 mg/dL (ref 2.5–4.6)

## 2024-06-18 LAB — MAGNESIUM: Magnesium: 2 mg/dL (ref 1.7–2.4)

## 2024-06-18 MED ORDER — TRAVASOL 10 % IV SOLN
INTRAVENOUS | Status: AC
  Filled 2024-06-18: qty 940.8

## 2024-06-18 MED ORDER — HYDROXYZINE HCL 10 MG PO TABS
10.0000 mg | ORAL_TABLET | Freq: Three times a day (TID) | ORAL | Status: DC | PRN
  Administered 2024-06-18 – 2024-06-24 (×8): 10 mg
  Filled 2024-06-18 (×11): qty 1

## 2024-06-18 MED ORDER — TAMSULOSIN HCL 0.4 MG PO CAPS
0.4000 mg | ORAL_CAPSULE | Freq: Every day | ORAL | Status: DC
  Administered 2024-06-18 – 2024-06-24 (×7): 0.4 mg via ORAL
  Filled 2024-06-18 (×8): qty 1

## 2024-06-18 MED ORDER — OPIUM 10 MG/ML (1%) PO TINC
6.0000 mg | Freq: Three times a day (TID) | ORAL | Status: DC
  Administered 2024-06-18 – 2024-06-20 (×7): 6 mg
  Filled 2024-06-18 (×7): qty 1

## 2024-06-18 NOTE — Progress Notes (Signed)
 PHARMACY - TOTAL PARENTERAL NUTRITION CONSULT NOTE   Indication: Prolonged ileus  Patient Measurements: Height: 5' 5 (165.1 cm) Weight:  (Unable to weight pt due to zeroed bed) IBW/kg (Calculated) : 57 TPN AdjBW (KG): 61.3 Body mass index is 25.53 kg/m.  Assessment: 69 yoF with history of diverticulitis with perforation and abscess. 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 She requested ostomy takedown and underwent LOA, colostomy takedown, small bowel resection with anastomosis on 5/29. Pharmacy is consulted to dose TPN starting 6/2 for postop ileus.  Post-op course complicated by anastomotic leak, multiple-intraabdominal abscesses. Post-op bleeding. Patient is now s/p diverting loop transverse colostomy.  Glucose / Insulin :  no Hx DM, A1c 5.1%. BG goal <180 -CBGs q6h remain < 150 -Not on SSI currently  Electrolytes:   Na remains low/down at 132,  CoCa 9.68-WNL, K steady at 4.0, Phos has improved to 3.2, Mg steady/WNL at 2.0  - 7/5: Gross specified Keep K>4, Mg>2, Phos>3.  Intermittent potassium supplementation with diuresis - 7/7: K Phos  Neutral 500 mg BID x 3 doses ordered by MD this am.  Potassium and phosphorus increased in TPN yesterday.  Hepatic: albumin  remains low 1.9, Alk Phos elevated -TG 174 (6/28) > 205 (6/30)>107 (7/5) >182 (7/7)  I/O:  - MIVF: none -UOP (foley) :2250 ml  -Stool: 480; Flexiseal since 7/3 (loperamide  BID, Fiber BID, Lomotil  QID, opium  tincture BID) -Drain:  265  GI Imaging:  -6/9 CT: Dempsey dehiscence along the rectal anastomosis, free air and multiple fluid collections -6/16 CT: Similar appearance at the rectal anastomosis concerning for dehiscence -6/23: poss catheter occlusion of PERC drain, several fistulous of sigmoid colon, gas collection, rectal anastomosis with dehiscence cavity communicating to small bowel, Multiple  additional small loculated fluid collection at new percutaneous drainage catheter site. Additional small loculated fluid collection small bowel mesentery.  - 7/1: CT: Multiple complex fluid collections within the upper abdomen  - 7/5: CT: No well-defined or clear enterorectal fistula.  Large mixed density fluid collections again noted in the abdomen and pelvis, possibly from prior hemorrhage. The upper abdominal wall fluid collection and the fluid collection deep to the umbilicus are similar to prior study. Anterior pelvic fluid collection containing air and mixed density fluid has increased in size. This is in the area of prior surgical drain, now removed.   GI Surgeries / Procedures: -5/29: LAR, SBR, colostomy takedown, LOA -6/10 drains placed x 2 in IR (midline abd, transgluteal) -6/17: Replacement of drainage catheter by IR -6/24: diverting loop transverse colostomy, I&D of abdominal abscess, gastrotomy tube placement, rectal tube placement.  -7/3: CT guided visceral fluid drain by perc cath -7/7: CT drainage of the left anterior abdominal fluid collection  Central access: Double lumen PICC placed 6/2 TPN start date: 6/2>7/1, resume 7/4>>  Nutritional Goals: Goal TPN rate is 70 mL/hr (provides 94 g of protein and 1680 kcals per day)  RD Assessment:  Estimated Needs Total Energy Estimated Needs: 1600-1750 kcals Total Protein Estimated Needs: 80-95 grams Total Fluid Estimated Needs: >/= 1.6L  Current Nutrition:  TPN Full liquid  Multiple calorie counts this admission. Not meeting caloric needs. Refusing all supplements. Pt underwent G-tube placement 6/24. Tube feeds started 6/26, stopped for abdominal distention and discomfort.  6/30 Tube feeds started>>stopped 7/3   Plan:  At 1800, continue TPN with lipids at 70 ml/hr (goal rate) Electrolytes in TPN:  Na 146 mEq/L - increased K 65 mEq/L Ca  5 mEq/L Mg 10 mEq/L Phos 25 mmol/L Cl:Ac 1:1 Continue MVI & trace in  TPN  Resume sSSI q6h if CBGs trend up >150 Monitor TPN labs on Mon/Thurs, and as needed. BMET, mag, & phos daily per MD.   Thank you for allowing pharmacy to be a part of this patient's care.  Eleanor EMERSON Agent, PharmD, BCPS Clinical Pharmacist [place] 06/18/2024 8:20 AM

## 2024-06-18 NOTE — Progress Notes (Signed)
 [facility]    Date of Admission:  05/08/2024      ID: Elizabeth Odom is a 83 y.o. female with polymicrobial intra-abdominal abscesses due to delayed anastomosis leak Principal Problem:   Diverticular disease Active Problems:   A-fib (HCC)   Restless leg   ABLA (acute blood loss anemia)   Need for emotional support   Acute urinary retention   Mixed hyperlipidemia   Essential hypertension   Diverticular disease of left colon   History of DVT (deep vein thrombosis)   History of pulmonary embolus (PE)   Pre-diabetes   Presence of IVC filter   Stricture of sigmoid colon (HCC)   Protein-calorie malnutrition, severe   Hypokalemia   S/P percutaneous endoscopic gastrostomy (PEG) tube placement (HCC)   Goals of care, counseling/discussion   Shock, during or resulting from a surgical procedure    Subjective: Afebrile; less itching of her back, still abodminal soreness/discomfort.  Medications:   acetaminophen   1,000 mg Per Tube Q6H   vitamin C   500 mg Per Tube Daily   Chlorhexidine  Gluconate Cloth  6 each Topical Daily   furosemide   20 mg Per Tube Daily   liver oil-zinc  oxide   Topical BID   metoprolol  tartrate  12.5 mg Per Tube BID   nystatin    Topical BID   Opium   6 mg Per Tube TID   mouth rinse  15 mL Mouth Rinse 4 times per day   oxyCODONE   5 mg Per Tube QHS   polycarbophil  1,250 mg Per Tube BID   sodium chloride  flush  10-40 mL Intracatheter Q12H   sodium chloride  flush  3 mL Intravenous Q12H   sodium chloride  flush  5 mL Intracatheter Q8H   tamsulosin   0.4 mg Oral QPC breakfast   triamcinolone  cream   Topical BID    Objective: Vital signs in last 24 hours: Temp:  [97.9 F (36.6 C)-98.6 F (37 C)] 98 F (36.7 C) (07/09 0617) Pulse Rate:  [76-94] 76 (07/09 0904) Resp:  [14-17] 17 (07/09 0617) BP: (124-138)/(66-86) 131/70 (07/09 0904) SpO2:  [95 %-98 %] 98 % (07/09 0617)  Physical Exam  Constitutional:  oriented to person, place,  and time. appears well-developed and well-nourished. No distress.  HENT: [place]/AT, PERRLA, no scleral icterus Mouth/Throat: Oropharynx is clear and moist. No oropharyngeal exudate.  Cardiovascular: Normal rate, regular rhythm and normal heart sounds. Exam reveals no gallop and no friction rub.  No murmur heard.  Pulmonary/Chest: Effort normal and breath sounds normal. No respiratory distress.  has no wheezes.  Neck = supple, no nuchal rigidity Abdominal: Soft. Bowel sounds are decreased.  exhibits no distension. There is no tenderness. No stool in ostomy bag. Blood in left sided jp drain. Rectal tube still in place Lymphadenopathy: no cervical adenopathy. No axillary adenopathy Neurological: alert and oriented to person, place, and time.  Skin: Skin is warm and dry. No rash noted. No erythema.  Psychiatric: a normal mood and affect.  behavior is normal.    Lab Results Recent Labs    06/16/24 0335 06/17/24 0445 06/18/24 0333  WBC 10.8* 9.5  --   HGB 10.5* 10.1*  --   HCT 33.8* 32.8*  --   NA 133* 134* 132*  K 3.8 4.0 4.0  CL 101 103 100  CO2 23 25 26   BUN 16 18 23   CREATININE 0.43* 0.36* 0.38*   Liver Panel Recent Labs    06/16/24 0335  PROT 7.0  ALBUMIN  1.9*  AST 23  ALT 17  ALKPHOS 299*  BILITOT 1.1   Sedimentation Rate No results for input(s): ESRSEDRATE in the last 72 hours. C-Reactive Protein No results for input(s): CRP in the last 72 hours.  Microbiology: 7/7 aspirate = NGTD 7/2 aspirate = MRSA, PsA, enterococcus and clostridium species Studies/Results: DG Chest Port 1 View Result Date: 06/18/2024 CLINICAL DATA:  Right pleural effusion. EXAM: PORTABLE CHEST 1 VIEW COMPARISON:  June 03, 2024. FINDINGS: Stable cardiomediastinal silhouette. Increased left perihilar and basilar opacity is noted concerning for pneumonia or edema with associated effusion. Right lung is unremarkable. Right-sided PICC line is noted with distal tip in expected position of the SVC. Bony  thorax is unremarkable. IMPRESSION: Increased left lung opacity as noted above concerning for pneumonia or edema with associated effusion. Electronically Signed   By: Lynwood Landy Raddle M.D.   On: 06/18/2024 10:35   CT GUIDED VISCERAL FLUID DRAIN BY PERC CATH Result Date: 06/16/2024 INDICATION: POSTOP LEFT ABDOMINAL FLUID COLLECTION EXAM: CT DRAINAGE OF THE LEFT ANTERIOR ABDOMINAL FLUID COLLECTION MEDICATIONS: The patient is currently admitted to the hospital and receiving intravenous antibiotics. The antibiotics were administered within an appropriate time frame prior to the initiation of the procedure. ANESTHESIA/SEDATION: Moderate (conscious) sedation was employed during this procedure. A total of Versed  0 mg and Fentanyl  50 mcg was administered intravenously by the radiology nurse. Total intra-service moderate Sedation Time: 9 minutes. The patient's level of consciousness and vital signs were monitored continuously by radiology nursing throughout the procedure under my direct supervision. COMPLICATIONS: None immediate. PROCEDURE: Informed written consent was obtained from the patient after a thorough discussion of the procedural risks, benefits and alternatives. All questions were addressed. Maximal Sterile Barrier Technique was utilized including caps, mask, sterile gowns, sterile gloves, sterile drape, hand hygiene and skin antiseptic. A timeout was performed prior to the initiation of the procedure. previous imaging reviewed. patient positioned supine. noncontrast localization CT performed. the left anterior abdominal fluid collection was localized and marked for a left lateral approach. Under sterile conditions and local anesthesia, the 18 gauge trocar needle was advanced from a anterior left lateral approach into the fluid collection. Needle position confirmed with CT. Guidewire inserted and confirmed within the collection. Tract dilatation performed to insert a 10 [place] drain. Drain catheter position  confirmed with CT. Serosanguineous fluid aspirated and sent for culture. Catheter secured with a silk suture and a sterile dressing. External suction bulb applied. Patient tolerated the procedure well. No immediate complication. IMPRESSION: Successful CT-guided drainage of the left anterior abdominal postop fluid collection. Culture sent. Electronically Signed   By: CHRISTELLA.  Shick M.D.   On: 06/16/2024 16:03     Assessment/Plan: Left anterior abdominal fluid collection = appears consistent with hematoma.blood in collection tube. Cx from 7/7 NGTD.  Continues on bowel rest for the time being  Intra-abdominal fluid collection = continue with meropenem  and daptomycin   Long term medication = will check weekly CK   Severe protein calorie malnutrition = currently on TPN support  [facility] Pager: [phone]  06/18/2024, 11:12 AM

## 2024-06-18 NOTE — Progress Notes (Signed)
 Progress Note   Patient: Elizabeth Odom FMW:[id number] DOB: 07/09/1941 DOA: 05/08/2024     41 DOS: the patient was seen and examined on 06/18/2024   Brief hospital course: Elizabeth Odom is a 83 y.o. female with a history of atrial fibrillation, DVT, PE, anemia, PACs, hypertension, mild cognitive impairment, large bowel obstruction. Patient initially presented for surgical management involving history of diverticulitis with planned ostomy takedown with anastomosis. During hospitalization, patient developed altered mental status secondary to polypharmacy in addition to atrial fibrillation with RVR. Cardiology consulted for recommendation and management with subsequent improvement in RVR. On 6/24, patient underwent repeat laparoscopy converted to laparotomy with diverting loops transverse colostomy performed, in addition to drainage of an abdominal abscess and gastrostomy/rectal tube placement. Patient required admission to ICU post-operatively for respiratory failure but was extubated successfully on 6/25 and subsequent transferred out of ICU.  Infectious disease was consulted  for  diverticulitis with perforation and abscess with colostomy, s/p ostomy takedown and redo.  Intra-abdominal abscess grew polymicrobial with Pseudomonas, staph and Candida.  ID advised for meropenem  and daptomycin .   Assessment and Plan:  Persistent atrial fibrillation/flutter Cardiology consulted for management.   -Medication adjusted to metoprolol  XL 25 mg daily with recommendation for Eliquis  when okay per surgery.  -Toprol -XL decreased to 12.5 mg daily. -Eliquis  held and transitioned to heparin  IV.  -Heparin  now held secondary to acute anemia with concern for hematoma. -Continue Toprol -XL 12.5 mg daily   History of diverticulitis with perforation and abscess with colostomy status post ostomy takedown and re-do Ileus Per primary.  Infectious diseases consulted and are planning for management with multi week course of  antibiotics.  Urine culture (6/12) significant for Pseudomonas, MRSA, Enterococcus faecalis, Candida albicans.  Drain culture (6/17) significant for rare Pseudomonas aeruginosa.  Ongoing antibiotics per infectious disease.  Patient now on daptomycin , micafungin , Zosyn . Patient underwent diverting colostomy on 6/24 with gastrotomy tube placement. TPN weaned off. Tube feeds started. Tube feeds now held and TPN re-ordered. Repeat CT abdomen/pelvis (7/5) significant for large fluid collections seen from prior CT imaging; IR consulted for drain, with left abdominal drain placed on 7/7 -Ongoing general surgery recommendations   Intraabdominal abscess Infectious disease consulted. Culture data is polymicrobial and evident for pseudomonas aeruginosa, staphylococcus aureus and candida albicans. Candida treated with micafungin  and course is completed. Abdominal drain fluid culture (7/2) significant for enterococcus faecalis, clostridium innocuum, MRSA. Left abdominal drain placed on 7/7 -ID recommendations: Meropenem , daptomycin  -Follow-up culture (7/7) data   Acute blood loss anemia Initially secondary to surgery, requiring multiple transfusions. Patient with recurrent significant anemia with concern for intraabdominal abscess vs hematoma. Patient then developed blood per rectum requiring multiple transfusions. Patient has received a total of 9 units of PRBC via transfusion. Hemoglobin stable. Will likely need to avoid anticoagulation completely in the future. -Follow-up post-transfusion H&H and transfuse as needed -Ongoing general surgery recommendations   Pleural effusions Moderate in size and bilateral. Currently asymptomatic. -If develops symptoms, will need a thoracentesis   Rectal output Unclear source as patient has a diverting colostomy, however she has minimal output from colostomy. Concern for possible fistula, however no fistula was identified on CT abdomen/pelvis (7/5). -Per general surgery    Contact dermatitis Noted on back. Very symptomatic with itching. Discussed with ID and not likely related to a drug reaction. No eosinophilia on differential. -Continue Atarax , triamcinolone , barrier cream   AKI Mild. Resolved.   Hypoalbuminemia Noted. Associated poor nutrition. Patient is on TPN.   Leukocytosis In setting  of surgery and infection. Mildly elevated.   Thrombocytosis Noted. Likely reactive. Mildly elevated.   Hyponatremia Mild. Stable.   Syncope Syncopal episode occurred during hospitalization while patient was on commode.  Concern for possible vasovagal versus hypotension etiology.  Transthoracic echo obtained with evidence of preserved LVEF of 60 to 65% in addition to no aortic stenosis.   Acute urinary retention Foley inserted on 6/4. Flomax  started. Foley removed on 6/13. Flomax  discontinued.   Acute metabolic encephalopathy Delirium Resolved.   History of DVT/PE LLE swelling Patient previously did not tolerate anticoagulation, requiring the placement of a retrievable IVC filter on 10/05/2023. CT this admission confirms its placement at this time. RLE venous duplex without evidence of acute DVT, but there is evidence of chronic DVT involving the left posterior tibial veins and left peroneal veins. -Continue IVC filter and will avoid anticoagulation   Generalized weakness PT/OT: Recommended SNF  DVT prophylaxis: SCDs     Subjective: Still complains of abdominal pain  Physical Exam: Vitals:   06/17/24 2009 06/18/24 0617 06/18/24 0904 06/18/24 1152  BP: 136/66 124/68 131/70 118/66  Pulse: 80 92 76 89  Resp: 14 17  18   Temp: 97.9 F (36.6 C) 98 F (36.7 C)  (!) 97.5 F (36.4 C)  TempSrc: Oral   Oral  SpO2: 95% 98%  97%  Weight:      Height:       Constitutional: Alert, awake, calm, comfortable HEENT: Neck supple Respiratory: clear to auscultation bilaterally, no wheezing, no crackles. Normal respiratory effort. No accessory muscle use.   Cardiovascular: Regular rate and rhythm, no murmurs / rubs / gallops. No extremity edema. 2+ pedal pulses. No carotid bruits.  Abdomen: Diffuse tenderness, distention, JP drain, PEG tube present Musculoskeletal: no clubbing / cyanosis. No joint deformity upper and lower extremities. Good ROM, no contractures. Normal muscle tone.  Skin: no rashes, lesions, ulcers. No induration Neurologic: CN 2-12 grossly intact. Sensation intact, DTR normal. Strength 5/5 x all 4 extremities.  Psychiatric: Normal judgment and insight. Alert and oriented x 3. Normal mood.   Data Reviewed:  Sodium 132, BUN 23, creatinine 0.38, white count 9.5, hemoglobin 10.1, hematocrit 32.8  Family Communication: No family member was around today  Disposition: Status is: Inpatient Remains inpatient appropriate because: Ongoing treatment of intra-abdominal abscess, continue general surgery and ID recommendations  Planned Discharge Destination: SNF    Time spent: 35 minutes  Author: Nena Rebel, MD 06/18/2024 4:02 PM  For on call review [email or web address].

## 2024-06-18 NOTE — Progress Notes (Signed)
 06/18/2024  Elizabeth Odom [id number] [birth year]  CARE TEAM: PCP: Elizabeth Bernardino MATSU, MD  Outpatient Care Team: Patient Care Team: Elizabeth Bernardino MATSU, MD as PCP - General (Internal Medicine) Elizabeth Groom, MD as Consulting Physician (Gastroenterology) Elizabeth Onetha NOVAK, MD (Neurology) Elizabeth Donnice SAUNDERS, MD as Consulting Physician (Urology) Elizabeth Redell PARAS, MD as Consulting Physician (Cardiology) Elizabeth Standing, MD as Consulting Physician (Colon and Rectal Surgery)  Inpatient Treatment Team: Treatment Team:  Elizabeth Standing, MD Elizabeth Stephane KIDD, RN Elizabeth Standing, MD Elizabeth Elgin LABOR, MD Elizabeth Elizabeth Radiology, MD Elizabeth Skeens, MD Elizabeth Dufm BROCKS, RN Elizabeth Civil, MD Elizabeth Odom, NT Small, Harlene DEL, RN Elizabeth Odom Elizabeth Odom, [facility] Apickup-Pt, A, PT Kriste Asberry BRAVO, RN   Problem List:   Principal Problem:   Diverticular disease Active Problems:   A-fib [facility])   Restless leg   ABLA (acute blood loss anemia)   Need for emotional support   Acute urinary retention   Mixed hyperlipidemia   Essential hypertension   Diverticular disease of left colon   History of DVT (deep vein thrombosis)   History of pulmonary embolus (PE)   Pre-diabetes   Presence of IVC filter   Stricture of sigmoid colon (HCC)   Protein-calorie malnutrition, severe   Hypokalemia   S/P percutaneous endoscopic gastrostomy (PEG) tube placement [facility])   Goals of care, counseling/discussion   Shock, during or resulting from a surgical procedure   05/08/2024  POST-OPERATIVE DIAGNOSIS:   COLOSTOMY FOR RESECTION, DESIRE FOR OSTOMY TAKEDOWN RECTAL STRICTURE HISTORY OF DIVERTICULITIS WITH PERFORATION & ABSCESS   PROCEDURE:   -ROBOTIC RECTOSIGMOID RESECTION (LAR) -TAKEDOWN OF END COLOSTOMY WITH ANASTOMOSIS -RESECTION OF SMALL INTESTINE WITH ANASTOMOSIS -SMALL BOWEL REPAIR -LYSIS OF ADHESIONS x 115 MINUTES (66% OF CASE),  -INTRAOPERATIVE ASSESSMENT OF TISSUE VASCULAR PERFUSION USING  ICG (indocyanine green ) IMMUNOFLUORESCENCE,  -TRANSVERSUS ABDOMINIS PLANE (TAP) BLOCK - BILATERAL -FLEXIBLE SIGMOIDOSCOPY   SURGEON:  Odom KYM Sheldon, MD   05/20/2024  Post procedural Dx: Post op abscess, multiple   Technically successful CT guided placed of a  10 Fr drainage catheter placement x 2  into the midline ventral and dorsal perirectal abscesses.     [name] LABOR, MD    05/28/2024  IMPRESSION: Satisfactory removal of the midline anterior abscess drainage catheter and placement of a new midline anterior pelvic drainage catheter as well as a posterior transgluteal drainage catheter in pelvic abscesses.     Electronically Signed   By: [name]    06/03/2024  POST-OPERATIVE DIAGNOSIS:   DELAYED COLORECTAL ANASTOMOTIC LEAK, NEED FOR FECAL DIVERSION CHRONIC PELVIC ABSCESS   PROCEDURE:   DIAGNOSTIC LAPAROSCOPY CONVERTED TO LAPAROTOMY DIVERTING LOOP TRANSVERSE COLOSTOMY DRAINAGE OF ABDOMINAL ABSCESS GASTROSTOMY TUBE PLACEMENT RECTAL TUBE PLACEMENT   SURGEON:  Odom KYM Sheldon, MD   OR FINDINGS: Very dense concrete intraperitoneal and pelvic adhesions infraumbilically.  Bladder, small bowel, omentum walling off the lower pelvis with delayed anastomotic leak.   Near disruption of colorectal anastomosis by transanal examination.  Rectal tube placed through rectal stump to drain the pelvis more directly.   Diverting mid-transverse loop colostomy done.   Removal of old percutaneous abdominal and transgluteal drains.     Assessment [facility] Stay = 41 days) 15 Days Post-Op    Failure to thrive with delayed anastomotic leaks     Plan:  Delayed anastomotic leaks   Delayed colorectal anastomotic leak initially controlled with bowel rest and drains since patient declined colostomy diversion.    Then  drain fell out & felt worse despite drain replacement.    Acquiesced to diverting loop transverse colostomy (lower abdomen extremely fixed).  6/25  with very fixed abdomen.    Transanal diarrhea started 7/2.    Concern by this & CT scan that her ileal SBR anastomosis leaking into the pelvis c/w internal ileorectal fistula.  Uncontrolled transanal diarrhea.  Flexi-Seal ordered by me 7/3 to help divert and protect the skin  Volume going down.  See if we can remove Flexi-Seal and switch to external catchment device.  Discussed with nursing.  If that fails then Flexi-Seal goes back in.  Control diarrhea.  Poor control with Lomotil  and loperamide .  Gradually increase deodorized tincture of opium  (DTO).  Increase to 3 times daily.    Low vol liquids for palliation, but leave gastrostomy tube to gravity.  Full TPN  Severe malnutrition   Back on TPN for now.  Infection:  Abdomen switched to daptomycin  & meropenem .  I am presuming this is by infectious disease.  Recent left upper quadrant fluid collection drained 7/7.  Seems consistent with seroma/old hematoma.  Nothing on cultures yet.  Follow.  Midline wound superficial enough to do just packing for now   C/o Leg & back itching -no complaints yesterday.  -No evid of rash nor weakness.    -Topical steroids.    -H2 blockage - Atarax .  I would make it PRN (as needed) and not scheduled since she is getting rather sedated on it scheduled    -would like to hold off on IV steroids given her delayed anastomotic leaks, severe malnutrition, chronic infections    Some recurrent atrial fibrillation and rapid rate.    PO/Gtube metoprolol  & amlodipine   Rate is controlled so do not see need for telemetry at this time.  Defer to medicine  Acute blood loss anemia in the setting of anemia of chronic disease.    Transfused 6/26, 7/2.    Suspect she had a rectus sheath hematoma bleeding on heparin .  That is held.    Hemoglobin more stable currently reassuring.    I think she has failed full anticoagulation again.  I would be very hesitant to restart full anticoagulation w heparin   ever again given her numerous severe bleeding episodes  -despite her history of PE/DVT (has IVC filter as a result of prior bleeding problems) and chronic A-fib.   -Duplex 7/7 probable chronic DVT on left (she complains of symptoms on right) but nothing acute  Can consider prophylactic heparin  or enoxaparin  if hemoglobin stable and no procedures for 48 hours.  See what internal medicine thinks.  No easy solutions here  No evidence of coagulopathy or thrombocytopenia.  Of note, she had a prior workup and follow-up that did not show any coagulopathy by Dr. Lanny with hematology.  May reconsult if persistent oozing/bleeding  IV iron  q week.   History of urinary retention.   Foley catheter removed 7/8  switch to pure wick to avoid internal catheter  Restart tamsulosin  given some history of urine retention.  Hold amlodipine  as a compromise and follow heart rate  -monitor electrolytes & replace as needed.  Keep K>4, Mg>2, Phos>3.  Replace phosphorus.  Pharmacy following and most likely will adjust TPN as well  -Diuresis as tolerated.  Trying to get down to her presurgical weight (~62kg)  if possible.  Follow-up with anemia needing transfusion.  -Persistent hypoxia presumably due to chronic pulmonary effusions due to chronic malnutrition.  I am trying some Lasix .  Repeat  chest x-ray today.  Medicine had debated about thoracentesis.  See what medicine think  -Pain control.  Scheduled Tylenol .  Was sedated so backed off on oxycodone  to just 5 mg nightly.  See if that helps her sleep     -mobilize as tolerated to help recovery.  Try and get therapists involved to help her mobilize more.  Biggest challenge.    I updated the patient's status to the patient and nurse.  Called discussed with the patient's granddaughter who closely follows her 170/1.  Recommendations were made.  Questions were answered.  They expressed understanding & appreciation.  -Disposition: TBD.   There is no way she can go  straight home.  She will need skilled nursing facility and rehab in the past.    Agree with palliative care.  I do not think we have a final point of futility but options are limited - neither I nor granddaughter desire prolonged suffering either.    See if we can turn things around in this coming week.     I reviewed last 24 h vitals and pain scores, last 48 h intake and output, last 24 h labs and trends, and last 24 h imaging results.  I have reviewed this patient's available data, including medical history, events of note, test results, etc as part of my evaluation.   A significant portion of that time was spent in counseling. Care during the described time interval was provided by me.  This care required moderate level of medical decision making.  06/18/2024    Subjective: (Chief complaint)  Drain placed.  Looks primarily serous/serosanguineous.  Patient resting in bed wanting just to sip drinks only.  Does not want to get out of bed.  Does not want to take any pills, so placing through the gastrostomy tube.  Nursing just outside room.  No complaints of any itching or pain through the night.  Foley catheter removed.  Urinating with pure wick.    Objective:  Vital signs:  Vitals:   06/17/24 0445 06/17/24 1219 06/17/24 2009 06/18/24 0617  BP: (!) 133/93 138/86 136/66 124/68  Pulse: 84 94 80 92  Resp: 16 16 14 17   Temp: 97.9 F (36.6 C) 98.6 F (37 C) 97.9 F (36.6 C) 98 F (36.7 C)  TempSrc: Oral Oral Oral   SpO2: 96% 95% 95% 98%  Weight:      Height:        Last BM Date : 06/18/24  Intake/Output   Yesterday:  07/08 0701 - 07/09 0700 In: 3609 [P.O.:360; I.V.:2160.6; IV Piggyback:823.4] Out: 2995 [Urine:2250; Drains:265; Stool:480] This shift:  No intake/output data recorded.   Bowel function:  Flatus: YES  BM:  YES -thin dark green  Drain:  Left upper quadrant percutaneous tube 7/7 thinly serosanguineous  Gastrostomy tube 6/24 with thin bilious  effluent and gravity bag Foley catheter d/c'd 7/8 Rectal tube with thin green bilious effluent   Physical Exam:  General: Sleeping but does wake up.  Tired and sickly but not acutely in distress              Eyes: PERRL, normal EOM.  Sclera clear.  No icterus Neuro: CN II-XII intact w/o focal sensory/motor deficits.  No facial droop. Lymph: No head/neck/groin lymphadenopathy Psych:  No delerium/psychosis/paranoia.  Oriented x 4 HENT: Normocephalic, Mucus membranes moist.  No thrush.  Mild HOH Neck: Supple, No tracheal deviation.  No obvious thyromegaly.  Chest: No pain to chest wall compression.  Good respiratory excursion.  Mild audible wheezing CV:  Pulses intact.  Regular rhythm.  No major extremity edema MS: Normal AROM mjr joints.  No obvious deformity  Abdomen:  Most of abdomen with firmness consistent with rectus sheath abdominal wall hematoma.  Mildy distended.  Mildly tender at incisions only.  No guarding.   Right upper quadrant mid transverse diverting loop colostomy pink with edema.  No gas nor stool Midline incision with some granulation. superificial Gastrostomy tube left upper quadrant without any leaking   GU: Pure wick in place with clear urine   Rectal: Flexi-Seal tube  - thin brown effluent.  Pruritus improved.  Skin warm dry. Ext:   No deformity.  No mjr edema.  No cyanosis Skin: No petechiae / purpurea.  No major sores.  Warm and dry    Results:   Cultures: Recent Results (from the past 720 hours)  MIC (1 Drug)-Abdominal drain abscess; 05/22/2024; Abdomen; MRSA; Daptomycin      Status: Abnormal   Collection Time: 05/22/24  1:54 PM   Specimen: Abdomen  Result Value Ref Range Status   Min Inhibitory Conc (1 Drug) Final report (A)  Corrected    Comment: (NOTE) Performed At: [facility] [address] [place], [place] [id number] Jennette Shorter MD Ey:[id number] CORRECTED ON 06/24 AT 0836: PREVIOUSLY REPORTED AS Preliminary report    Source ABSCESS   Final    Comment: Performed at [facility], [address] [place], [place] [address]  MIC Result     Status: Abnormal   Collection Time: 05/22/24  1:54 PM  Result Value Ref Range Status   Result 1 (MIC) Comment (A)  Final    Comment: (NOTE) Methicillin - resistant Staphylococcus aureus Identification performed by account, not confirmed by this laboratory. DAPTOMYCIN    <=1 UG/ML = SUSCEPTIBLE Performed At: [facility] [address] [place], [place] [id number] Jennette Shorter MD Ey:[id number]   Aerobic/Anaerobic Culture w Gram Stain (surgical/deep wound)     Status: None   Collection Time: 05/22/24  3:59 PM   Specimen: Abdomen  Result Value Ref Range Status   Specimen Description   Final    ABDOMEN Performed at [facility], [address], [place], [place] [address]    Special Requests   Final    ABDOMINAL DRAIN Performed at [facility], [address] [place], [place] [address]    Gram Stain   Final    ABUNDANT WBC PRESENT, PREDOMINANTLY PMN RARE GRAM POSITIVE COCCI RARE YEAST WITH PSEUDOHYPHAE    Culture   Final    MODERATE PSEUDOMONAS AERUGINOSA MODERATE STAPHYLOCOCCUS AUREUS SUSCEPTIBILITIES PERFORMED ON PREVIOUS CULTURE WITHIN THE LAST 5 DAYS. MODERATE CANDIDA ALBICANS SEE SEPARATE REPORT NO ANAEROBES ISOLATED Performed at [facility], [address], [place], [place] [address]    Report Status 06/04/2024 FINAL  Final   Organism ID, Bacteria PSEUDOMONAS AERUGINOSA  Final      Susceptibility   Pseudomonas aeruginosa - MIC*    CEFTAZIDIME 4 SENSITIVE Sensitive     CIPROFLOXACIN <=0.25 SENSITIVE Sensitive     GENTAMICIN 4 SENSITIVE Sensitive     IMIPENEM 2 SENSITIVE Sensitive     PIP/TAZO <=4 SENSITIVE Sensitive ug/mL    CEFEPIME 2 SENSITIVE Sensitive     * MODERATE PSEUDOMONAS AERUGINOSA  Aerobic/Anaerobic Culture w Gram Stain (surgical/deep wound)     Status: None   Collection Time: 05/22/24   3:59 PM   Specimen: Back  Result Value Ref Range Status   Specimen Description  Final    BACK Performed at [facility], [address], [place], [place] [address]    Special Requests   Final    TRANSGLUTEAL PERCUTANEOUS IR DRAIN Performed at [facility], [address], [place], [place] [address]    Gram Stain   Final    NO WBC SEEN ABUNDANT GRAM NEGATIVE RODS RARE GRAM POSITIVE COCCI IN PAIRS RARE BUDDING YEAST SEEN    Culture   Final    ABUNDANT PSEUDOMONAS AERUGINOSA ABUNDANT METHICILLIN RESISTANT STAPHYLOCOCCUS AUREUS ABUNDANT ENTEROCOCCUS FAECALIS ABUNDANT CANDIDA ALBICANS NO ANAEROBES ISOLATED Sent to [facility] for further susceptibility testing. Performed at [facility], [address] [place], [place] [address]    Report Status 05/27/2024 FINAL  Final   Organism ID, Bacteria PSEUDOMONAS AERUGINOSA  Final   Organism ID, Bacteria METHICILLIN RESISTANT STAPHYLOCOCCUS AUREUS  Final   Organism ID, Bacteria ENTEROCOCCUS FAECALIS  Final      Susceptibility   Enterococcus faecalis - MIC*    AMPICILLIN <=2 SENSITIVE Sensitive     VANCOMYCIN  1 SENSITIVE Sensitive     GENTAMICIN SYNERGY SENSITIVE Sensitive     * ABUNDANT ENTEROCOCCUS FAECALIS   Methicillin resistant staphylococcus aureus - MIC*    CIPROFLOXACIN >=8 RESISTANT Resistant     ERYTHROMYCIN  >=8 RESISTANT Resistant     GENTAMICIN <=0.5 SENSITIVE Sensitive     OXACILLIN >=4 RESISTANT Resistant     TETRACYCLINE <=1 SENSITIVE Sensitive     VANCOMYCIN  1 SENSITIVE Sensitive     TRIMETH/SULFA >=320 RESISTANT Resistant     CLINDAMYCIN <=0.25 SENSITIVE Sensitive     RIFAMPIN <=0.5 SENSITIVE Sensitive     Inducible Clindamycin NEGATIVE Sensitive     LINEZOLID 2 SENSITIVE Sensitive     * ABUNDANT METHICILLIN RESISTANT STAPHYLOCOCCUS AUREUS   Pseudomonas aeruginosa - MIC*    CEFTAZIDIME 4 SENSITIVE Sensitive     CIPROFLOXACIN <=0.25 SENSITIVE Sensitive     GENTAMICIN 4  SENSITIVE Sensitive     IMIPENEM 2 SENSITIVE Sensitive     PIP/TAZO <=4 SENSITIVE Sensitive ug/mL    CEFEPIME 2 SENSITIVE Sensitive     * ABUNDANT PSEUDOMONAS AERUGINOSA  Yeast Susceptibilities     Status: None   Collection Time: 05/22/24  3:59 PM  Result Value Ref Range Status   SOURCE CANDIDA ALBICAN ABD ABSC  Final    Comment: Performed at [facility], [address], [place], [place] [address]   Organism ID, Yeast Candida albicans  Final    Comment: (NOTE) Identification performed by account, not confirmed by this laboratory.    Amphotericin B MIC 0.25 ug/mL  Final    Comment: (NOTE) Breakpoints have been established for only some organism-drug combinations as indicated. This test was developed and its performance characteristics determined by [facility]. It has not been cleared or approved by the Food and Drug Administration.    Anidulafungin MIC Comment  Final    Comment: (NOTE) 0.03 ug/mL Susceptible Breakpoints have been established for only some organism-drug combinations as indicated. This test was developed and its performance characteristics determined by [facility]. It has not been cleared or approved by the Food and Drug Administration.    Caspofungin MIC Comment  Final    Comment: (NOTE) 0.12 ug/mL Susceptible Breakpoints have been established for only some organism-drug combinations as indicated. This test was developed and its performance characteristics determined by [facility]. It has not been cleared or approved by the Food and Drug Administration.    Fluconazole  Islt MIC 1.0  ug/mL Susceptible  Final    Comment: (NOTE) Breakpoints have been established for only some organism-drug combinations as indicated. This test was developed and its performance characteristics determined by [facility]. It has not been cleared or approved by the Food and Drug Administration.    ISAVUCONAZOLE MIC 0.008 ug/mL or less  Final    Comment: (NOTE) This test was developed  and its performance characteristics determined by [facility]. It has not been cleared or approved by the Food and Drug Administration.    Itraconazole MIC 0.06 ug/mL  Final    Comment: (NOTE) Breakpoints have been established for only some organism-drug combinations as indicated. This test was developed and its performance characteristics determined by [facility]. It has not been cleared or approved by the Food and Drug Administration.    Micafungin  MIC Comment  Final    Comment: (NOTE) 0.008 ug/mL or less, Susceptible Breakpoints have been established for only some organism-drug combinations as indicated. This test was developed and its performance characteristics determined by [facility]. It has not been cleared or approved by the Food and Drug Administration.    Posaconazole MIC 0.06 ug/mL  Final    Comment: (NOTE) Breakpoints have been established for only some organism-drug combinations as indicated. This test was developed and its performance characteristics determined by [facility]. It has not been cleared or approved by the Food and Drug Administration.    REZAFUNGIN MIC Comment  Final    Comment: (NOTE) 0.15 ug/mL Susceptible This test was developed and its performance characteristics determined by [facility]. It has not been cleared or approved by the Food and Drug Administration.    Voriconazole MIC Comment  Final    Comment: (NOTE) 0.15 ug/mL Susceptible Breakpoints have been established for only some organism-drug combinations as indicated. This test was developed and its performance characteristics determined by [facility]. It has not been cleared or approved by the Food and Drug Administration. Performed At: [facility] [address] [place], [place] [id number] Jennette Shorter MD Ey:[id number]   Aerobic/Anaerobic Culture w Gram Stain (surgical/deep wound)     Status: None   Collection Time: 05/27/24  5:19 PM   Specimen: Abscess  Result Value Ref Range Status    Specimen Description   Final    ABSCESS ABDOMEN Performed at [facility], [address], [place], [place] [address]    Special Requests   Final    NONE Performed at [facility], [address] [place], [place] [address]    Gram Stain   Final    ABUNDANT WBC PRESENT, PREDOMINANTLY PMN NO ORGANISMS SEEN    Culture   Final    RARE PSEUDOMONAS AERUGINOSA RARE CANDIDA ALBICANS NO ANAEROBES ISOLATED Performed at [facility], [address], [place], [place] [address]    Report Status 06/01/2024 FINAL  Final   Organism ID, Bacteria PSEUDOMONAS AERUGINOSA  Final      Susceptibility   Pseudomonas aeruginosa - MIC*    CEFTAZIDIME 4 SENSITIVE Sensitive     CIPROFLOXACIN <=0.25 SENSITIVE Sensitive     GENTAMICIN 4 SENSITIVE Sensitive     IMIPENEM 2 SENSITIVE Sensitive     PIP/TAZO <=4 SENSITIVE Sensitive ug/mL    CEFEPIME 2 SENSITIVE Sensitive     * RARE PSEUDOMONAS AERUGINOSA  Surgical pcr screen     Status: Abnormal   Collection Time: 06/02/24  7:55 PM   Specimen: Nasal Mucosa; Nasal Swab  Result Value Ref Range Status   MRSA, PCR POSITIVE (A) NEGATIVE  Final   Staphylococcus aureus POSITIVE (A) NEGATIVE Final    Comment: (NOTE) The Xpert SA Assay (FDA approved for NASAL specimens in patients 48 years of age and older), is one component of a comprehensive surveillance program. It is not intended to diagnose infection nor to guide or monitor treatment. Performed at [facility], [address], [place], [place] [address]   Aerobic/Anaerobic Culture w Gram Stain (surgical/deep wound)     Status: None   Collection Time: 06/11/24  4:12 PM   Specimen: Abdomen; Abdominal Fluid  Result Value Ref Range Status   Specimen Description   Final    ABDOMEN Performed at [facility], [address], [place], [place] [address]    Special Requests   Final    ABDOMINAL FLUID Performed at [facility], [address], [place], [place] [address]    Gram Stain   Final    ABUNDANT WBC PRESENT, PREDOMINANTLY PMN ABUNDANT GRAM NEGATIVE RODS FEW GRAM POSITIVE COCCI    Culture   Final    MODERATE PSEUDOMONAS AERUGINOSA Two isolates with different morphologies were identified as the same organism.The most resistant organism was reported. FEW METHICILLIN RESISTANT STAPHYLOCOCCUS AUREUS MODERATE ENTEROCOCCUS FAECALIS MODERATE CLOSTRIDIUM INNOCUUM Standardized susceptibility testing for this organism is not available. Performed at [facility], [address] [place], [place] [address]    Report Status 06/16/2024 FINAL  Final   Organism ID, Bacteria PSEUDOMONAS AERUGINOSA  Final   Organism ID, Bacteria ENTEROCOCCUS FAECALIS  Final   Organism ID, Bacteria METHICILLIN RESISTANT STAPHYLOCOCCUS AUREUS  Final      Susceptibility   Enterococcus faecalis - MIC*    AMPICILLIN <=2 SENSITIVE Sensitive     VANCOMYCIN  1 SENSITIVE Sensitive     GENTAMICIN SYNERGY SENSITIVE Sensitive     * MODERATE ENTEROCOCCUS FAECALIS   Methicillin resistant staphylococcus aureus - MIC*    CIPROFLOXACIN >=8 RESISTANT Resistant     ERYTHROMYCIN  >=8 RESISTANT Resistant     GENTAMICIN <=0.5 SENSITIVE Sensitive     OXACILLIN >=4 RESISTANT Resistant     TETRACYCLINE <=1 SENSITIVE Sensitive     VANCOMYCIN  1 SENSITIVE Sensitive     TRIMETH/SULFA >=320 RESISTANT Resistant     CLINDAMYCIN <=0.25 SENSITIVE Sensitive     RIFAMPIN <=0.5 SENSITIVE Sensitive     Inducible Clindamycin NEGATIVE Sensitive     LINEZOLID 2 SENSITIVE Sensitive     * FEW METHICILLIN RESISTANT STAPHYLOCOCCUS AUREUS   Pseudomonas aeruginosa - MIC*    CEFTAZIDIME >=64 RESISTANT Resistant     CIPROFLOXACIN 0.5 SENSITIVE Sensitive     GENTAMICIN 4 SENSITIVE Sensitive     IMIPENEM 2 SENSITIVE Sensitive     * MODERATE PSEUDOMONAS AERUGINOSA  Aerobic/Anaerobic Culture w Gram Stain (surgical/deep wound)     Status: None  (Preliminary result)   Collection Time: 06/16/24  1:08 PM   Specimen: Abscess  Result Value Ref Range Status   Specimen Description   Final    ABSCESS ABDOMEN Performed at [facility], [address] [place], [place] [address]    Special Requests   Final    NONE Performed at [facility], [address], [place], [place] [address]    Gram Stain NO WBC SEEN NO ORGANISMS SEEN   Final   Culture   Final    NO GROWTH < 12 HOURS Performed at [facility], [address] [place], [place] [address]    Report Status PENDING  Incomplete    Labs: Results for orders placed or performed during the hospital encounter of 05/08/24 (from the past 48 hours)  Glucose, capillary     Status: Abnormal   Collection Time: 06/16/24 11:36 AM  Result Value Ref Range   Glucose-Capillary 142 (H) 70 - 99 mg/dL    Comment: Glucose reference range applies only to samples taken after fasting for at least 8 hours.  Aerobic/Anaerobic Culture w Gram Stain (surgical/deep wound)     Status: None (Preliminary result)   Collection Time: 06/16/24  1:08 PM   Specimen: Abscess  Result Value Ref Range   Specimen Description      ABSCESS ABDOMEN Performed at [facility], [address] [place], [place] [address]    Special Requests      NONE Performed at [facility], [address] [place], [place] [address]    Gram Stain NO WBC SEEN NO ORGANISMS SEEN     Culture      NO GROWTH < 12 HOURS Performed at [facility], [address] [place], [place] [address]    Report Status PENDING   Glucose, capillary     Status: Abnormal   Collection Time: 06/16/24  6:14 PM  Result Value Ref Range   Glucose-Capillary 126 (H) 70 - 99 mg/dL    Comment: Glucose reference range applies only to samples taken after fasting for at least 8 hours.  Glucose, capillary     Status: Abnormal   Collection Time: 06/16/24 11:46 PM  Result  Value Ref Range   Glucose-Capillary 148 (H) 70 - 99 mg/dL    Comment: Glucose reference range applies only to samples taken after fasting for at least 8 hours.  CBC     Status: Abnormal   Collection Time: 06/17/24  4:45 AM  Result Value Ref Range   WBC 9.5 4.0 - 10.5 K/uL   RBC 3.58 (L) 3.87 - 5.11 MIL/uL   Hemoglobin 10.1 (L) 12.0 - 15.0 g/dL   HCT 67.1 (L) 63.9 - 53.9 %   MCV 91.6 80.0 - 100.0 fL   MCH 28.2 26.0 - 34.0 pg   MCHC 30.8 30.0 - 36.0 g/dL   RDW 81.6 (H) 88.4 - 84.4 %   Platelets 426 (H) 150 - 400 K/uL   nRBC 0.0 0.0 - 0.2 %    Comment: Performed at [facility], [address], [place], [place] [address]  Basic metabolic panel with GFR     Status: Abnormal   Collection Time: 06/17/24  4:45 AM  Result Value Ref Range   Sodium 134 (L) 135 - 145 mmol/L   Potassium 4.0 3.5 - 5.1 mmol/L   Chloride 103 98 - 111 mmol/L   CO2 25 22 - 32 mmol/L   Glucose, Bld 126 (H) 70 - 99 mg/dL    Comment: Glucose reference range applies only to samples taken after fasting for at least 8 hours.   BUN 18 8 - 23 mg/dL   Creatinine, Ser 9.63 (L) 0.44 - 1.00 mg/dL   Calcium  8.2 (L) 8.9 - 10.3 mg/dL   GFR, Estimated >39 >39 mL/min    Comment: (NOTE) Calculated using the CKD-EPI Creatinine Equation (2021)    Anion gap 6 5 - 15    Comment: Performed at [facility], [address], [place], [place] [address]  Magnesium      Status: None   Collection Time: 06/17/24  4:45 AM  Result Value Ref  Range   Magnesium  2.0 1.7 - 2.4 mg/dL    Comment: Performed at [facility], [address], [place], [place] [address]  Phosphorus     Status: None   Collection Time: 06/17/24  4:45 AM  Result Value Ref Range   Phosphorus 2.8 2.5 - 4.6 mg/dL    Comment: Performed at [facility], [address] [place], [place] [address]  Glucose, capillary     Status: Abnormal   Collection Time: 06/17/24  6:04 AM  Result Value Ref Range    Glucose-Capillary 132 (H) 70 - 99 mg/dL    Comment: Glucose reference range applies only to samples taken after fasting for at least 8 hours.  Glucose, capillary     Status: Abnormal   Collection Time: 06/17/24 11:34 AM  Result Value Ref Range   Glucose-Capillary 128 (H) 70 - 99 mg/dL    Comment: Glucose reference range applies only to samples taken after fasting for at least 8 hours.  Glucose, capillary     Status: Abnormal   Collection Time: 06/17/24  5:47 PM  Result Value Ref Range   Glucose-Capillary 128 (H) 70 - 99 mg/dL    Comment: Glucose reference range applies only to samples taken after fasting for at least 8 hours.  Glucose, capillary     Status: Abnormal   Collection Time: 06/17/24 11:37 PM  Result Value Ref Range   Glucose-Capillary 128 (H) 70 - 99 mg/dL    Comment: Glucose reference range applies only to samples taken after fasting for at least 8 hours.  Basic metabolic panel with GFR     Status: Abnormal   Collection Time: 06/18/24  3:33 AM  Result Value Ref Range   Sodium 132 (L) 135 - 145 mmol/L   Potassium 4.0 3.5 - 5.1 mmol/L   Chloride 100 98 - 111 mmol/L   CO2 26 22 - 32 mmol/L   Glucose, Bld 127 (H) 70 - 99 mg/dL    Comment: Glucose reference range applies only to samples taken after fasting for at least 8 hours.   BUN 23 8 - 23 mg/dL   Creatinine, Ser 9.61 (L) 0.44 - 1.00 mg/dL   Calcium  8.0 (L) 8.9 - 10.3 mg/dL   GFR, Estimated >39 >39 mL/min    Comment: (NOTE) Calculated using the CKD-EPI Creatinine Equation (2021)    Anion gap 6 5 - 15    Comment: Performed at [facility], [address] [place], [place] [address]  Magnesium      Status: None   Collection Time: 06/18/24  3:33 AM  Result Value Ref Range   Magnesium  2.0 1.7 - 2.4 mg/dL    Comment: Performed at [facility], [address] [place], [place] [address]  Phosphorus     Status: None   Collection Time: 06/18/24  3:33 AM  Result Value Ref Range    Phosphorus 3.2 2.5 - 4.6 mg/dL    Comment: Performed at [facility], [address], [place], [place] [address]  Glucose, capillary     Status: Abnormal   Collection Time: 06/18/24  6:15 AM  Result Value Ref Range   Glucose-Capillary 134 (H) 70 - 99 mg/dL    Comment: Glucose reference range applies only to samples taken after fasting for at least 8 hours.    Imaging / Studies: VAS US  LOWER EXTREMITY VENOUS (DVT) Result Date: 06/16/2024  Lower Venous DVT Study Patient Name:  LATAISHA COLAN  Date of Exam:   06/16/2024 Medical Rec #: [id number]       Accession #:    [id number] Date of Birth: [birth year]      Patient Gender: F Patient Age:   83 years Exam Location:  [facility] Procedure:      VAS US  LOWER EXTREMITY VENOUS (DVT) Referring Phys: RALPH NETTEY --------------------------------------------------------------------------------  Indications: Edema. Extensive hospital stay, intolerant to anticoagulants. Other Indications: Large bowel obstruction, status post multiple abdominal                    surgeries, including colostomy and colostomy reversal. Risk Factors: DVT 10/04/23 left popliteal, posterior tibial, peroneal, and gastroc veins History of PE. Patient had IVC filter placement 10/05/23 by IR. Atrial fibrillation. Comparison Study: Prior bilateral LEV done 10/04/2023 Performing Technologist: Alberta Lis RVS  Examination Guidelines: A complete evaluation includes B-mode imaging, spectral Doppler, color Doppler, and power Doppler as needed of all accessible portions of each vessel. Bilateral testing is considered an integral part of a complete examination. Limited examinations for reoccurring indications may be performed as noted. The reflux portion of the exam is performed with the patient in reverse Trendelenburg.  +---------+---------------+---------+-----------+----------+--------------+ RIGHT    CompressibilityPhasicitySpontaneityPropertiesThrombus Aging  +---------+---------------+---------+-----------+----------+--------------+ CFV      Full           Yes      Yes                                 +---------+---------------+---------+-----------+----------+--------------+ SFJ      Full                                                        +---------+---------------+---------+-----------+----------+--------------+ FV Prox  Full           Yes      Yes                                 +---------+---------------+---------+-----------+----------+--------------+ FV Mid   Full           Yes      Yes                                 +---------+---------------+---------+-----------+----------+--------------+ FV DistalFull                                                        +---------+---------------+---------+-----------+----------+--------------+ PFV      Full           Yes      Yes                                 +---------+---------------+---------+-----------+----------+--------------+ POP      Full           Yes      Yes                                 +---------+---------------+---------+-----------+----------+--------------+  PTV      Full                                                        +---------+---------------+---------+-----------+----------+--------------+ PERO     Full                                                        +---------+---------------+---------+-----------+----------+--------------+ Soleal   Full                                                        +---------+---------------+---------+-----------+----------+--------------+ Gastroc  Full                                                        +---------+---------------+---------+-----------+----------+--------------+ SSV      Full                                                        +---------+---------------+---------+-----------+----------+--------------+    +---------+---------------+---------+-----------+----------+--------------+ LEFT     CompressibilityPhasicitySpontaneityPropertiesThrombus Aging +---------+---------------+---------+-----------+----------+--------------+ CFV      Full           Yes      Yes                                 +---------+---------------+---------+-----------+----------+--------------+ SFJ      Full                                                        +---------+---------------+---------+-----------+----------+--------------+ FV Prox  Full                                                        +---------+---------------+---------+-----------+----------+--------------+ FV Mid   Full           Yes      Yes                                 +---------+---------------+---------+-----------+----------+--------------+ FV DistalFull           Yes      No                                  +---------+---------------+---------+-----------+----------+--------------+  PFV      Full                                                        +---------+---------------+---------+-----------+----------+--------------+ POP      Full           Yes      Yes                                 +---------+---------------+---------+-----------+----------+--------------+ PTV      Partial                                                     +---------+---------------+---------+-----------+----------+--------------+ PERO     Partial                                      Chronic        +---------+---------------+---------+-----------+----------+--------------+ Soleal                                                Chronic        +---------+---------------+---------+-----------+----------+--------------+ Gastroc  Full                                                        +---------+---------------+---------+-----------+----------+--------------+     Summary: RIGHT: - There is no evidence of  deep vein thrombosis in the lower extremity.  - No cystic structure found in the popliteal fossa. interstitial edema noted in popliteal fossa and throughout calf  LEFT: - Findings consistent with chronic deep vein thrombosis involving the left posterior tibial veins, and left peroneal veins.  - There is no evidence of acute deep vein thrombosis in the lower extremity.  Interstitial edema noted in popliteal fossa and throughout calf.  *See table(s) above for measurements and observations. Electronically signed by Fonda Rim on 06/16/2024 at 5:50:11 PM.    Final    CT GUIDED VISCERAL FLUID DRAIN BY PERC CATH Result Date: 06/16/2024 INDICATION: POSTOP LEFT ABDOMINAL FLUID COLLECTION EXAM: CT DRAINAGE OF THE LEFT ANTERIOR ABDOMINAL FLUID COLLECTION MEDICATIONS: The patient is currently admitted to the hospital and receiving intravenous antibiotics. The antibiotics were administered within an appropriate time frame prior to the initiation of the procedure. ANESTHESIA/SEDATION: Moderate (conscious) sedation was employed during this procedure. A total of Versed  0 mg and Fentanyl  50 mcg was administered intravenously by the [facility] nurse. Total intra-service moderate Sedation Time: 9 minutes. The patient's level of consciousness and vital signs were monitored continuously by [facility] nursing throughout the procedure under my direct supervision. COMPLICATIONS: None immediate. PROCEDURE: Informed written consent was obtained from the patient after a thorough discussion of the procedural risks, benefits and alternatives. All questions were addressed. Maximal Sterile Barrier Technique was utilized  including caps, mask, sterile gowns, sterile gloves, sterile drape, hand hygiene and skin antiseptic. A timeout was performed prior to the initiation of the procedure. previous imaging reviewed. patient positioned supine. noncontrast localization CT performed. the left anterior abdominal fluid collection was localized and marked  for a left lateral approach. Under sterile conditions and local anesthesia, the 18 gauge trocar needle was advanced from a anterior left lateral approach into the fluid collection. Needle position confirmed with CT. Guidewire inserted and confirmed within the collection. Tract dilatation performed to insert a 10 [place] drain. Drain catheter position confirmed with CT. Serosanguineous fluid aspirated and sent for culture. Catheter secured with a silk suture and a sterile dressing. External suction bulb applied. Patient tolerated the procedure well. No immediate complication. IMPRESSION: Successful CT-guided drainage of the left anterior abdominal postop fluid collection. Culture sent. Electronically Signed   By: CHRISTELLA.  Shick M.D.   On: 06/16/2024 16:03         Medications / Allergies: per chart  Antibiotics: Anti-infectives (From admission, onward)    Start     Dose/Rate Route Frequency Ordered Stop   06/15/24 1600  meropenem  (MERREM ) 1 g in sodium chloride  0.9 % 100 mL IVPB        1 g 200 mL/hr over 30 Minutes Intravenous Every 8 hours 06/15/24 1510     06/15/24 1600  DAPTOmycin  (CUBICIN ) IVPB 700 mg/100mL premix        700 mg 200 mL/hr over 30 Minutes Intravenous Daily 06/15/24 1510     06/11/24 1545  piperacillin -tazobactam (ZOSYN ) IVPB 3.375 g  Status:  Discontinued        3.375 g 12.5 mL/hr over 240 Minutes Intravenous Every 8 hours 06/11/24 1457 06/15/24 1510   06/09/24 1745  DAPTOmycin  (CUBICIN ) IVPB 500 mg/50mL premix  Status:  Discontinued        500 mg 100 mL/hr over 30 Minutes Intravenous Daily 06/09/24 1647 06/13/24 1539   06/06/24 1000  micafungin  (MYCAMINE ) 100 mg in sodium chloride  0.9 % 100 mL IVPB  Status:  Discontinued        100 mg 105 mL/hr over 1 Hours Intravenous Every 24 hours 06/05/24 1349 06/12/24 1142   06/04/24 1400  piperacillin -tazobactam (ZOSYN ) IVPB 3.375 g        3.375 g 12.5 mL/hr over 240 Minutes Intravenous Every 8 hours 06/04/24 0738 06/09/24 2124    06/04/24 1400  DAPTOmycin  (CUBICIN ) IVPB 500 mg/71mL premix        8 mg/kg  60.9 kg 100 mL/hr over 30 Minutes Intravenous Daily 06/04/24 0738 06/10/24 0126   06/03/24 1000  neomycin  (MYCIFRADIN ) tablet 500 mg  Status:  Discontinued        500 mg Oral 3 times daily 06/03/24 0935 06/03/24 1555   06/03/24 1000  metroNIDAZOLE  (FLAGYL ) tablet 500 mg  Status:  Discontinued       Note to Pharmacy: Take 2 pills (=1000mg ) by mouth at 1pm, 3pm, and 10pm the day before your colorectal operation   500 mg Oral 3 times daily 06/03/24 0935 06/03/24 1555   05/29/24 1500  DAPTOmycin  (CUBICIN ) IVPB 500 mg/50mL premix  Status:  Discontinued        8 mg/kg  60.9 kg 100 mL/hr over 30 Minutes Intravenous Daily 05/29/24 1359 06/04/24 0738   05/28/24 2100  vancomycin  (VANCOREADY) IVPB 750 mg/150 mL  Status:  Discontinued        750 mg 150 mL/hr over 60 Minutes Intravenous Every 12 hours 05/28/24 1419 05/29/24 1359  05/27/24 0800  vancomycin  (VANCOCIN ) IVPB 1000 mg/200 mL premix  Status:  Discontinued        1,000 mg 200 mL/hr over 60 Minutes Intravenous Every 24 hours 05/26/24 1144 05/28/24 1419   05/23/24 1400  piperacillin -tazobactam (ZOSYN ) IVPB 3.375 g  Status:  Discontinued        3.375 g 12.5 mL/hr over 240 Minutes Intravenous Every 8 hours 05/23/24 0804 06/04/24 0738   05/23/24 1200  micafungin  (MYCAMINE ) 100 mg in sodium chloride  0.9 % 100 mL IVPB        100 mg 105 mL/hr over 1 Hours Intravenous Every 24 hours 05/23/24 0755 06/05/24 1221   05/22/24 0800  vancomycin  (VANCOCIN ) IVPB 1000 mg/200 mL premix  Status:  Discontinued        1,000 mg 200 mL/hr over 60 Minutes Intravenous Every 24 hours 05/21/24 0634 05/26/24 0806   05/21/24 0730  vancomycin  (VANCOREADY) IVPB 1250 mg/250 mL        1,250 mg 166.7 mL/hr over 90 Minutes Intravenous  Once 05/21/24 0634 05/21/24 1043   05/19/24 1400  piperacillin -tazobactam (ZOSYN ) IVPB 3.375 g  Status:  Discontinued        3.375 g 12.5 mL/hr over 240 Minutes  Intravenous Every 8 hours 05/19/24 1303 05/23/24 0804   05/09/24 0900  erythromycin  250 mg in sodium chloride  0.9 % 100 mL IVPB        250 mg 100 mL/hr over 60 Minutes Intravenous Every 8 hours 05/09/24 0732 05/11/24 0044   05/08/24 2200  cefoTEtan  (CEFOTAN ) 2 g in sodium chloride  0.9 % 100 mL IVPB        2 g 200 mL/hr over 30 Minutes Intravenous Every 12 hours 05/08/24 1759 05/09/24 0749   05/08/24 1400  neomycin  (MYCIFRADIN ) tablet 1,000 mg  Status:  Discontinued       Placed in And Linked Group   1,000 mg Oral 3 times per day 05/08/24 1119 05/08/24 1120   05/08/24 1400  metroNIDAZOLE  (FLAGYL ) tablet 1,000 mg  Status:  Discontinued       Placed in And Linked Group   1,000 mg Oral 3 times per day 05/08/24 1119 05/08/24 1120   05/08/24 1130  cefoTEtan  (CEFOTAN ) 2 g in sodium chloride  0.9 % 100 mL IVPB        2 g 200 mL/hr over 30 Minutes Intravenous On call to O.R. 05/08/24 1119 05/09/24 0749         Note: Portions of this report may have been transcribed using voice recognition software. Every effort was made to ensure accuracy; however, inadvertent computerized transcription errors may be present.   Any transcriptional errors that result from this process are unintentional.    Elspeth KYM Schultze, MD, FACS, MASCRS Esophageal, Gastrointestinal & Colorectal Surgery Robotic and Minimally Invasive Surgery  [facility] [address] [place], [place] [address] [phone] Fax [phone] Main  CONTACT INFORMATION: Weekday (9AM-5PM): Call [facility] main office at [phone] Weeknight (5PM-9AM) or Weekend/Holiday: Check EPIC Web Links tab & use AMION (password  [email or web address]) for General Surgery [facility] coverage  Please, DO NOT use SecureChat  (it is not reliable communication to reach operating surgeons & will lead to a delay in care).   Epic staff messaging available for outptient concerns needing 1-2 business day  response.      06/18/2024  7:45 AM

## 2024-06-19 ENCOUNTER — Inpatient Hospital Stay (HOSPITAL_COMMUNITY)

## 2024-06-19 DIAGNOSIS — K5790 Diverticulosis of intestine, part unspecified, without perforation or abscess without bleeding: Secondary | ICD-10-CM | POA: Diagnosis not present

## 2024-06-19 LAB — COMPREHENSIVE METABOLIC PANEL WITH GFR
ALT: 29 U/L (ref 0–44)
AST: 32 U/L (ref 15–41)
Albumin: 1.9 g/dL — ABNORMAL LOW (ref 3.5–5.0)
Alkaline Phosphatase: 255 U/L — ABNORMAL HIGH (ref 38–126)
Anion gap: 7 (ref 5–15)
BUN: 25 mg/dL — ABNORMAL HIGH (ref 8–23)
CO2: 24 mmol/L (ref 22–32)
Calcium: 7.9 mg/dL — ABNORMAL LOW (ref 8.9–10.3)
Chloride: 98 mmol/L (ref 98–111)
Creatinine, Ser: 0.44 mg/dL (ref 0.44–1.00)
GFR, Estimated: >60 mL/min (ref >60)
Glucose, Bld: 118 mg/dL — ABNORMAL HIGH (ref 70–99)
Potassium: 4 mmol/L (ref 3.5–5.1)
Sodium: 129 mmol/L — ABNORMAL LOW (ref 135–145)
Total Bilirubin: 1.9 mg/dL — ABNORMAL HIGH (ref 0.0–1.2)
Total Protein: 7 g/dL (ref 6.5–8.1)

## 2024-06-19 LAB — LACTATE DEHYDROGENASE, PLEURAL OR PERITONEAL FLUID: LD, Fluid: 257 U/L — ABNORMAL HIGH (ref 3–23)

## 2024-06-19 LAB — BODY FLUID CELL COUNT WITH DIFFERENTIAL
Eos, Fluid: 1 %
Lymphs, Fluid: 12 %
Monocyte-Macrophage-Serous Fluid: 4 % — ABNORMAL LOW (ref 50–90)
Neutrophil Count, Fluid: 83 % — ABNORMAL HIGH (ref 0–25)
Total Nucleated Cell Count, Fluid: 1890 uL — ABNORMAL HIGH (ref 0–1000)

## 2024-06-19 LAB — PHOSPHORUS: Phosphorus: 3 mg/dL (ref 2.5–4.6)

## 2024-06-19 LAB — PROTEIN, PLEURAL OR PERITONEAL FLUID: Total protein, fluid: 3.5 g/dL

## 2024-06-19 LAB — GLUCOSE, CAPILLARY
Glucose-Capillary: 136 mg/dL — ABNORMAL HIGH (ref 70–99)
Glucose-Capillary: 144 mg/dL — ABNORMAL HIGH (ref 70–99)

## 2024-06-19 LAB — MAGNESIUM: Magnesium: 2 mg/dL (ref 1.7–2.4)

## 2024-06-19 MED ORDER — TRAVASOL 10 % IV SOLN
INTRAVENOUS | Status: AC
  Filled 2024-06-19: qty 940.8

## 2024-06-19 MED ORDER — LIDOCAINE-EPINEPHRINE (PF) 2 %-1:200000 IJ SOLN
INTRAMUSCULAR | Status: AC
  Filled 2024-06-19: qty 20

## 2024-06-19 MED ORDER — METOPROLOL TARTRATE 25 MG PO TABS
25.0000 mg | ORAL_TABLET | Freq: Two times a day (BID) | ORAL | Status: DC
  Administered 2024-06-19 – 2024-06-20 (×2): 25 mg
  Filled 2024-06-19 (×2): qty 1

## 2024-06-19 MED ORDER — GABAPENTIN 100 MG PO CAPS
100.0000 mg | ORAL_CAPSULE | Freq: Three times a day (TID) | ORAL | Status: DC
  Administered 2024-06-19 – 2024-06-20 (×4): 100 mg via ORAL
  Filled 2024-06-19 (×4): qty 1

## 2024-06-19 MED ORDER — METOPROLOL TARTRATE 5 MG/5ML IV SOLN: 2.5000 mg | Freq: Four times a day (QID) | INTRAVENOUS | Status: DC | PRN

## 2024-06-19 NOTE — Plan of Care (Signed)
  Problem: Education: Goal: Verbalization of understanding of the causes of altered bowel function will improve Outcome: Progressing   Problem: Pain Managment: Goal: General experience of comfort will improve and/or be controlled Outcome: Progressing

## 2024-06-19 NOTE — Procedures (Signed)
 PROCEDURE SUMMARY:  Successful US  guided left thoracentesis. Yielded of amber fluid. Pt tolerated procedure fair;  difficulty with roll, itching (RN medicated in US  prior to start), complained of difficulty with position stating I can't breathe despite re-positioning and elevating HOB, O2 sats 95% throughout.  Patient given maximum support and encouragement.  Provided opportunities to terminate procedure, however she repeats it's got to be done. No immediate complications.  Specimen was sent for labs. CXR ordered.  EBL < 5 mL  Solmon Selmer Ku PA-C 06/19/2024 3:39 PM

## 2024-06-19 NOTE — Plan of Care (Signed)
  Problem: Nutritional: Goal: Will attain and maintain optimal nutritional status will improve Outcome: Progressing   Problem: Clinical Measurements: Goal: Postoperative complications will be avoided or minimized Outcome: Progressing   Problem: Skin Integrity: Goal: Will show signs of wound healing Outcome: Progressing   Problem: Education: Goal: Verbalization of understanding of the causes of altered bowel function will improve Outcome: Not Progressing   Problem: Bowel/Gastric: Goal: Gastrointestinal status for postoperative course will improve Outcome: Not Progressing   Problem: Health Behavior/Discharge Planning: Goal: Identification of community resources to assist with postoperative recovery needs will improve Outcome: Not Progressing   Problem: Respiratory: Goal: Respiratory status will improve Outcome: Adequate for Discharge

## 2024-06-19 NOTE — Progress Notes (Addendum)
 Progress Note   Patient: Elizabeth Odom FMW:[id number] DOB: Apr 07, 1941 DOA: 05/08/2024     42 DOS: the patient was seen and examined on 06/19/2024   Brief hospital course: Elizabeth Odom is a 84 y.o. female with a history of atrial fibrillation, DVT, PE, anemia, PACs, hypertension, mild cognitive impairment, large bowel obstruction. Patient initially presented for surgical management involving history of diverticulitis with planned ostomy takedown with anastomosis. During hospitalization, patient developed altered mental status secondary to polypharmacy in addition to atrial fibrillation with RVR. Cardiology consulted for recommendation and management with subsequent improvement in RVR. On 6/24, patient underwent repeat laparoscopy converted to laparotomy with diverting loops transverse colostomy performed, in addition to drainage of an abdominal abscess and gastrostomy/rectal tube placement. Patient required admission to ICU post-operatively for respiratory failure but was extubated successfully on 6/25 and subsequent transferred out of ICU.  Infectious disease was consulted  for  diverticulitis with perforation and abscess with colostomy, s/p ostomy takedown and redo.  Intra-abdominal abscess grew polymicrobial with Pseudomonas, staph and Candida.  ID advised for meropenem  and daptomycin .  Patient has persistent hypoxemia, abdomen has a drain, there is a left pleural effusion on chest x-ray.  Ultrasound-guided thoracocentesis has been requested by IR.   Assessment and Plan:  Persistent hypoxemia/pneumonia/pleural effusion on the left side - Consulted IR for possible ultrasound-guided Thora centesis - Continue oxygen to maintain saturation more than 90%   Persistent atrial fibrillation/flutter Cardiology consulted for management.   -Medication adjusted to metoprolol  XL 25 mg daily with recommendation for Eliquis  when okay per surgery. - IV metoprolol  2.5 mg for systolic blood pressure more than  160 and heart rate more than 130 -Eliquis  held and transitioned to heparin  IV.  -Heparin  now held secondary to acute anemia with concern for hematoma.   History of diverticulitis with perforation and abscess with colostomy status post ostomy takedown and re-do Ileus Per primary.  Infectious diseases consulted and are planning for management with multi week course of antibiotics.  Urine culture (6/12) significant for Pseudomonas, MRSA, Enterococcus faecalis, Candida albicans.  Drain culture (6/17) significant for rare Pseudomonas aeruginosa.  Ongoing antibiotics per infectious disease.  Patient now on daptomycin , micafungin , Zosyn . Patient underwent diverting colostomy on 6/24 with gastrotomy tube placement. TPN weaned off. Tube feeds started. Tube feeds now held and TPN re-ordered. Repeat CT abdomen/pelvis (7/5) significant for large fluid collections seen from prior CT imaging; IR consulted for drain, with left abdominal drain placed on 7/7 -Ongoing general surgery recommendations   Intraabdominal abscess Infectious disease consulted. Culture data is polymicrobial and evident for pseudomonas aeruginosa, staphylococcus aureus and candida albicans. Candida treated with micafungin  and course is completed. Abdominal drain fluid culture (7/2) significant for enterococcus faecalis, clostridium innocuum, MRSA. Left abdominal drain placed on 7/7 -ID recommendations: Meropenem , daptomycin  -Follow-up culture (7/7) data   Acute blood loss anemia Initially secondary to surgery, requiring multiple transfusions. Patient with recurrent significant anemia with concern for intraabdominal abscess vs hematoma. Patient then developed blood per rectum requiring multiple transfusions. Patient has received a total of 9 units of PRBC via transfusion. Hemoglobin stable. Will likely need to avoid anticoagulation completely in the future. -Follow-up post-transfusion H&H and transfuse as needed -Ongoing general surgery  recommendations   Pleural effusions Moderate in size and bilateral. Currently asymptomatic. -If develops symptoms, will need a thoracentesis   Rectal output Unclear source as patient has a diverting colostomy, however she has minimal output from colostomy. Concern for possible fistula, however no fistula was identified on  CT abdomen/pelvis (7/5). -Per general surgery   Contact dermatitis Noted on back. Very symptomatic with itching. Discussed with ID and not likely related to a drug reaction. No eosinophilia on differential. -Continue Atarax , triamcinolone , barrier cream   AKI Mild. Resolved.   Hypoalbuminemia Noted. Associated poor nutrition. Patient is on TPN.   Leukocytosis In setting of surgery and infection. Mildly elevated.   Thrombocytosis Noted. Likely reactive. Mildly elevated.   Hyponatremia Mild. Stable.   Syncope Syncopal episode occurred during hospitalization while patient was on commode.  Concern for possible vasovagal versus hypotension etiology.  Transthoracic echo obtained with evidence of preserved LVEF of 60 to 65% in addition to no aortic stenosis.   Acute urinary retention Foley inserted on 6/4. Flomax  started. Foley removed on 6/13. Flomax  discontinued.   Acute metabolic encephalopathy Delirium Resolved.   History of DVT/PE LLE swelling Patient previously did not tolerate anticoagulation, requiring the placement of a retrievable IVC filter on 10/05/2023. CT this admission confirms its placement at this time. RLE venous duplex without evidence of acute DVT, but there is evidence of chronic DVT involving the left posterior tibial veins and left peroneal veins. -Continue IVC filter and will avoid anticoagulation   Generalized weakness PT/OT: Recommended SNF   DVT prophylaxis: SCDs       Subjective: Complains of pain all over the abdomen  Physical Exam: Vitals:   06/19/24 0434 06/19/24 0826 06/19/24 0827 06/19/24 1000  BP: (!) 151/82 137/69   126/88  Pulse: 92 (!) 181 (!) 101 91  Resp: 18     Temp: 98.3 F (36.8 C) (!) 97.5 F (36.4 C)    TempSrc: Oral Oral    SpO2: 96% 96% 100% 99%  Weight:      Height:       Constitutional: Alert, awake, calm, comfortable HEENT: Neck supple, oxygen in place Respiratory: clear to auscultation bilaterally, no wheezing, no crackles. Normal respiratory effort. No accessory muscle use.  Cardiovascular: Regular rate and rhythm, no murmurs / rubs / gallops. No extremity edema. 2+ pedal pulses. No carotid bruits.  Abdomen: Diffuse tenderness, bowel sounds are present, drains are present Musculoskeletal: no clubbing / cyanosis. No joint deformity upper and lower extremities. Good ROM, no contractures. Normal muscle tone.  Skin: no rashes, lesions, ulcers. No induration Neurologic: CN 2-12 grossly intact. Sensation intact, DTR normal. Strength 5/5 x all 4 extremities.  Psychiatric: Normal judgment and insight. Alert and oriented x 3. Normal mood.   Data Reviewed:  Sodium 129, potassium 4, BUN 25, creatinine 0.44, magnesium  2  Family Communication: None available  Disposition: Status is: Inpatient Remains inpatient appropriate because: Clinical improvement  Planned Discharge Destination: Skilled nursing facility    Time spent: 35 minutes  Author: Nena Rebel, MD 06/19/2024 1:01 PM  For on call review [email or web address].

## 2024-06-19 NOTE — Progress Notes (Signed)
 PHARMACY - TOTAL PARENTERAL NUTRITION CONSULT NOTE   Indication: Prolonged ileus  Patient Measurements: Height: 5' 5 (165.1 cm) Weight:  (Unable to weight pt due to zeroed bed) IBW/kg (Calculated) : 57 TPN AdjBW (KG): 61.3 Body mass index is 25.53 kg/m.  Assessment: 45 yoF with history of diverticulitis with perforation and abscess. 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 She requested ostomy takedown and underwent LOA, colostomy takedown, small bowel resection with anastomosis on 5/29. Pharmacy is consulted to dose TPN starting 6/2 for postop ileus.  Post-op course complicated by anastomotic leak, multiple-intraabdominal abscesses. Post-op bleeding. Patient is now s/p diverting loop transverse colostomy.  Glucose / Insulin : no Hx DM, A1c 5.1%; CBG goal 100-150) - CBGs q6h remain < 150 - Off SSI since 6/28 Electrolytes:  Per Dr. Sheldon, keep K>4, Mg>2, Phos>3.  - Na remains low and decreased despite increasing in TPN, all other lytes stable WNL - All electrolyte goals for ileus met as per the above Renal: SCr stable WNL (<1.0); BUN now slightly elevated - UOP remains adequate on Lasix  20 mg/d Hepatic: AST/ALT stable WNL; Alk Phos elevated but peaked shortly after 7/7 and now trending down - Albumin  remains significantly low - Tbili now increased to 1.9; previously WNL - TG WNL at start of TPN, became elevated on 6/25; measured peak at 205 on 6/30; currently still elevated but trending down I/O: - Colostomy: no output yesterday (possibly being diverted as below) - Gastrostomy (vent): OP had improved since 7/4 weekend; now increasing again to 500 ml yesterday - J-P Drain: OP significantly improved; now < 100 ml/d - no mIVF - Stool: rectal output ([facility] suspects d/t leakage into abdomen at ileal anastomosis, with resultant creation of ileo-rectal and possible  ileovesical fistula) now improving with antimotility agents; no output charted last 24 hr   GI Imaging:  - 6/9 CTa/p: Dehiscence of rectal anastomosis w/ associated air & fluid collections - 6/16 CTa/p: minimal change from 6/9 - 6/23 CTa/p: possibly occluded JP drain, rectal anastomosis with dehiscence cavity communicating to small bowel; several fistulas of sigmoid colon with associated gas and multiple small loculated fluid collection at new percutaneous drainage catheter site. Additional small loculated fluid collection small bowel mesentery.  - 7/1 CTa/p: Multiple complex fluid collections within the upper abdomen  - 7/5 CTa/p: No well-defined or clear enterorectal fistula. Persistent fluid collections again noted in the abdomen and pelvis, possibly from prior hemorrhage, unchanged from prior. Enlarging anterior pelvic fluid collection at prior drain site  GI Surgeries / Procedures: - 5/29: LAR, SBR, colostomy takedown, LOA - 6/10 drains placed x 2 in IR (midline abd, transgluteal) - 6/17: Replacement of drainage catheter by IR - 6/24: diverting loop transverse colostomy, I&D of abdominal abscess, gastrotomy tube placement, rectal tube placement.  - 7/3: CT guided visceral fluid drain by perc cath - 7/7: CT drainage of the left anterior abdominal fluid collection  Central access: Double lumen PICC placed 6/2 TPN start date: 6/2>7/1, resume 7/4>>  Nutritional Goals: Goal TPN rate is 70 mL/hr (provides 94 g of protein and 1680 kcals per day)  RD Assessment:  Estimated Needs Total Energy Estimated Needs: 1600-1750 kcals Total Protein Estimated Needs: 80-95 grams Total Fluid Estimated Needs: >/= 1.6L  Current Nutrition:  7/9 FLD, TPN at goal rate  Multiple calorie counts this admission. Not meeting caloric needs. Refusing all supplements. Pt underwent G-tube placement 6/24. Tube feeds started 6/26, stopped  for abdominal distention and discomfort. Restarted 6/30 >> stopped  7/3.   Plan:  At 1800, continue TPN with lipids at 70 ml/hr (goal rate) Electrolytes in TPN: maximize Na; increase Cl:Ac Na 146 >> 154 mEq/L K 65 mEq/L Ca 5 mEq/L Mg 10 mEq/L Phos 25 mmol/L Cl:Ac 1:1 >> 2:1 Continue MVI & trace in TPN Chromium on hold d/t critical shortage Stop CBG checks with excellent control; trend SBG on routine Bmet MIVF per MD (none currently) Monitor TPN labs on Mon/Thurs, and as needed CMP tomorrow (for Tbili) Mg & Phos daily per [facility]    Thank you for allowing pharmacy to be a part of this patient's care.  Bard Jeans, PharmD, BCPS [phone] 06/19/2024, 9:18 AM

## 2024-06-19 NOTE — Progress Notes (Signed)
 Referring Physician(s): Gross,S  Supervising Physician: Philip Cornet  Patient Status:  [facility] - In-pt  Chief Complaint: Polymicrobial post op intra-abdominal abscesses due to delayed anastomosis leak ; s/p LUQ drain 06/14/24   Subjective: Pt resting quietly in bed; afebrile, still c/o some abd discomfort, some dyspnea, occ cough; no N/V   Allergies: Elemental sulfur and Heparin   Medications: Prior to Admission medications   Medication Sig Start Date End Date Taking? Authorizing Provider  amLODipine  (NORVASC ) 5 MG tablet Take 1 tablet (5 mg total) by mouth daily. Patient taking differently: Take 5 mg by mouth at bedtime. 04/08/24  Yes Patwardhan, Manish J, MD  ascorbic acid  (VITAMIN C ) 250 MG CHEW Chew 250 mg by mouth daily.   Yes [provider]  aspirin  EC 81 MG tablet Take 81 mg by mouth daily. Swallow whole.   Yes [provider]  cyanocobalamin  (VITAMIN B12) 1000 MCG tablet Take 1,000 mcg by mouth daily.   Yes [provider]  FIBER GUMMIES PO Take 1 capsule by mouth daily.   Yes [provider]  Multiple Vitamin (MULTIVITAMIN WITH MINERALS) TABS tablet Take 1 tablet by mouth daily. 11/10/23  Yes Tobie Yetta HERO, MD  Omega-3 Fatty Acids (OMEGA 3 500 PO) Take 500 mg by mouth daily.   Yes [provider]  OVER THE COUNTER MEDICATION Take 650 mg by mouth daily. Total Beets supplement   Yes [provider]  traMADol  (ULTRAM ) 50 MG tablet Take 1-2 tablets (50-100 mg total) by mouth every 6 (six) hours as needed for moderate pain (pain score 4-6) or severe pain (pain score 7-10). 05/08/24  Yes [name], MD     Vital Signs: BP 137/69 (BP Location: Left Arm)   Pulse (!) 101   Temp (!) 97.5 F (36.4 C) (Oral)   Resp 18   Ht 5' 5 (1.651 m)   Wt 153 lb 7 oz (69.6 kg)   SpO2 100%   BMI 25.53 kg/m   Physical Exam awake,answers questions ok, LUQ drain intact, insertion site clean and dry, OP 60 cc blood-tinged fluid; drain  irrigated with minimal return  Imaging: DG Chest Port 1 View Result Date: 06/18/2024 CLINICAL DATA:  Right pleural effusion. EXAM: PORTABLE CHEST 1 VIEW COMPARISON:  June 03, 2024. FINDINGS: Stable cardiomediastinal silhouette. Increased left perihilar and basilar opacity is noted concerning for pneumonia or edema with associated effusion. Right lung is unremarkable. Right-sided PICC line is noted with distal tip in expected position of the SVC. Bony thorax is unremarkable. IMPRESSION: Increased left lung opacity as noted above concerning for pneumonia or edema with associated effusion. Electronically Signed   By: Lynwood Landy Raddle M.D.   On: 06/18/2024 10:35   VAS US  LOWER EXTREMITY VENOUS (DVT) Result Date: 06/16/2024  Lower Venous DVT Study Patient Name:  TELESA JEANCHARLES  Date of Exam:   06/16/2024 Medical Rec #: [id number]       Accession #:    [id number] Date of Birth: Apr 01, 1941      Patient Gender: F Patient Age:   3 years Exam Location:  [facility] Procedure:      VAS US  LOWER EXTREMITY VENOUS (DVT) Referring Phys: RALPH NETTEY --------------------------------------------------------------------------------  Indications: Edema. Extensive hospital stay, intolerant to anticoagulants. Other Indications: Large bowel obstruction, status post multiple abdominal                    surgeries, including colostomy and colostomy reversal. Risk Factors: DVT 10/04/23 left popliteal,  posterior tibial, peroneal, and gastroc veins History of PE. Patient had IVC filter placement 10/05/23 by IR. Atrial fibrillation. Comparison Study: Prior bilateral LEV done 10/04/2023 Performing Technologist: Alberta Lis RVS  Examination Guidelines: A complete evaluation includes B-mode imaging, spectral Doppler, color Doppler, and power Doppler as needed of all accessible portions of each vessel. Bilateral testing is considered an integral part of a complete examination. Limited examinations for reoccurring indications may be  performed as noted. The reflux portion of the exam is performed with the patient in reverse Trendelenburg.  +---------+---------------+---------+-----------+----------+--------------+ RIGHT    CompressibilityPhasicitySpontaneityPropertiesThrombus Aging +---------+---------------+---------+-----------+----------+--------------+ CFV      Full           Yes      Yes                                 +---------+---------------+---------+-----------+----------+--------------+ SFJ      Full                                                        +---------+---------------+---------+-----------+----------+--------------+ FV Prox  Full           Yes      Yes                                 +---------+---------------+---------+-----------+----------+--------------+ FV Mid   Full           Yes      Yes                                 +---------+---------------+---------+-----------+----------+--------------+ FV DistalFull                                                        +---------+---------------+---------+-----------+----------+--------------+ PFV      Full           Yes      Yes                                 +---------+---------------+---------+-----------+----------+--------------+ POP      Full           Yes      Yes                                 +---------+---------------+---------+-----------+----------+--------------+ PTV      Full                                                        +---------+---------------+---------+-----------+----------+--------------+ PERO     Full                                                        +---------+---------------+---------+-----------+----------+--------------+  Soleal   Full                                                        +---------+---------------+---------+-----------+----------+--------------+ Gastroc  Full                                                         +---------+---------------+---------+-----------+----------+--------------+ SSV      Full                                                        +---------+---------------+---------+-----------+----------+--------------+   +---------+---------------+---------+-----------+----------+--------------+ LEFT     CompressibilityPhasicitySpontaneityPropertiesThrombus Aging +---------+---------------+---------+-----------+----------+--------------+ CFV      Full           Yes      Yes                                 +---------+---------------+---------+-----------+----------+--------------+ SFJ      Full                                                        +---------+---------------+---------+-----------+----------+--------------+ FV Prox  Full                                                        +---------+---------------+---------+-----------+----------+--------------+ FV Mid   Full           Yes      Yes                                 +---------+---------------+---------+-----------+----------+--------------+ FV DistalFull           Yes      No                                  +---------+---------------+---------+-----------+----------+--------------+ PFV      Full                                                        +---------+---------------+---------+-----------+----------+--------------+ POP      Full           Yes      Yes                                 +---------+---------------+---------+-----------+----------+--------------+  PTV      Partial                                                     +---------+---------------+---------+-----------+----------+--------------+ PERO     Partial                                      Chronic        +---------+---------------+---------+-----------+----------+--------------+ Soleal                                                Chronic         +---------+---------------+---------+-----------+----------+--------------+ Gastroc  Full                                                        +---------+---------------+---------+-----------+----------+--------------+     Summary: RIGHT: - There is no evidence of deep vein thrombosis in the lower extremity.  - No cystic structure found in the popliteal fossa. interstitial edema noted in popliteal fossa and throughout calf  LEFT: - Findings consistent with chronic deep vein thrombosis involving the left posterior tibial veins, and left peroneal veins.  - There is no evidence of acute deep vein thrombosis in the lower extremity.  Interstitial edema noted in popliteal fossa and throughout calf.  *See table(s) above for measurements and observations. Electronically signed by Fonda Rim on 06/16/2024 at 5:50:11 PM.    Final    CT GUIDED VISCERAL FLUID DRAIN BY PERC CATH Result Date: 06/16/2024 INDICATION: POSTOP LEFT ABDOMINAL FLUID COLLECTION EXAM: CT DRAINAGE OF THE LEFT ANTERIOR ABDOMINAL FLUID COLLECTION MEDICATIONS: The patient is currently admitted to the hospital and receiving intravenous antibiotics. The antibiotics were administered within an appropriate time frame prior to the initiation of the procedure. ANESTHESIA/SEDATION: Moderate (conscious) sedation was employed during this procedure. A total of Versed  0 mg and Fentanyl  50 mcg was administered intravenously by the radiology nurse. Total intra-service moderate Sedation Time: 9 minutes. The patient's level of consciousness and vital signs were monitored continuously by radiology nursing throughout the procedure under my direct supervision. COMPLICATIONS: None immediate. PROCEDURE: Informed written consent was obtained from the patient after a thorough discussion of the procedural risks, benefits and alternatives. All questions were addressed. Maximal Sterile Barrier Technique was utilized including caps, mask, sterile gowns, sterile gloves,  sterile drape, hand hygiene and skin antiseptic. A timeout was performed prior to the initiation of the procedure. previous imaging reviewed. patient positioned supine. noncontrast localization CT performed. the left anterior abdominal fluid collection was localized and marked for a left lateral approach. Under sterile conditions and local anesthesia, the 18 gauge trocar needle was advanced from a anterior left lateral approach into the fluid collection. Needle position confirmed with CT. Guidewire inserted and confirmed within the collection. Tract dilatation performed to insert a 10 [place] drain. Drain catheter position confirmed with CT. Serosanguineous fluid aspirated and sent for culture. Catheter secured with a silk suture and a sterile dressing. External suction  bulb applied. Patient tolerated the procedure well. No immediate complication. IMPRESSION: Successful CT-guided drainage of the left anterior abdominal postop fluid collection. Culture sent. Electronically Signed   By: CHRISTELLA.  Shick M.D.   On: 06/16/2024 16:03    Labs:  CBC: Recent Labs    06/14/24 0233 06/15/24 0414 06/16/24 0335 06/17/24 0445  WBC 9.3 8.3 10.8* 9.5  HGB 9.7* 9.9* 10.5* 10.1*  HCT 31.5* 32.1* 33.8* 32.8*  PLT 360 393 413* 426*    COAGS: Recent Labs    10/05/23 0247 10/11/23 1255 10/12/23 0655 05/19/24 2054 06/04/24 1522 06/05/24 0429 06/11/24 0309  INR 1.1 1.1 1.1 1.2  --   --  1.2  APTT 30  --   --   --  41* 40*  --     BMP: Recent Labs    06/16/24 0335 06/17/24 0445 06/18/24 0333 06/19/24 0305  NA 133* 134* 132* 129*  K 3.8 4.0 4.0 4.0  CL 101 103 100 98  CO2 23 25 26 24   GLUCOSE 119* 126* 127* 118*  BUN 16 18 23  25*  CALCIUM  8.1* 8.2* 8.0* 7.9*  CREATININE 0.43* 0.36* 0.38* 0.44  GFRNONAA >60 >60 >60 >60    LIVER FUNCTION TESTS: Recent Labs    06/09/24 0406 06/14/24 0233 06/16/24 0335 06/19/24 0305  BILITOT 1.4* 1.2 1.1 1.9*  AST 22 25 23  32  ALT 21 18 17 29   ALKPHOS 196*  231* 299* 255*  PROT 6.1* 6.3* 7.0 7.0  ALBUMIN  1.5* 1.8* 1.9* 1.9*    Assessment and Plan: Pt with prior hx diverticulitis with perf/abscess with colostomy/ostomy takedown/G tube placement, delayed colorectal anast leak, multiple abd fluid collections, s/p LUQ drain placement 7/7; afebrile, creat nl, last hgb 10.1 7/8, WBC nl, latest drain fl cx neg to date; CXR yesterday with increased left lung opacity as noted above concerning for pneumonia or edema with associated effusion   Drain Location: LUQ Size: Fr size: 10 Fr Date of placement: 06/16/24  Currently to: Drain collection device: suction bulb 24 hour output:  Output by Drain (mL) 06/17/24 0701 - 06/17/24 1900 06/17/24 1901 - 06/18/24 0700 06/18/24 0701 - 06/18/24 1900 06/18/24 1901 - 06/19/24 0700 06/19/24 0701 - 06/19/24 0924  Closed System Drain Left LLQ Bulb (JP) 10 Fr.  15  60   Gastrostomy/Enterostomy Gastrostomy 22 Fr. LUQ  250  500       Current examination: Flushes/aspirates easily.  Insertion site unremarkable. Suture /statlock in place. Dressed appropriately.   Plan: Continue TID flushes with 5 cc NS. Record output Q shift. Dressing changes QD or PRN if soiled.  Call IR APP or on call IR MD if difficulty flushing or sudden change in drain output.  Repeat imaging/possible drain injection once output < 10 mL/QD (excluding flush material). Consideration for drain removal if output is < 10 mL/QD (excluding flush material), pending discussion with the providing surgical service.  Discharge planning: Please contact IR APP or on call IR MD prior to patient d/c to ensure appropriate follow up plans are in place. Typically patient will follow up with IR clinic 10-14 days post d/c for repeat imaging/possible drain injection. IR scheduler will contact patient with date/time of appointment. Patient will need to flush drain QD with 5 cc NS, record output QD, dressing changes every 2-3 days or earlier if soiled.   IR will  continue to follow - please call with questions or concerns.      Electronically Signed: D. Franky Rakers, PA-C 06/19/2024, 8:58  AM   I spent a total of 15 Minutes at the the patient's bedside AND on the patient's hospital floor or unit, greater than 50% of which was counseling/coordinating care for left abdominal fluid collection drain    Patient ID: Elda CHRISTELLA Lecher, female   DOB: [birth year], 83 y.o.   MRN: [id number]

## 2024-06-19 NOTE — Progress Notes (Signed)
 PT Cancellation Note  Patient Details Name: Elizabeth Odom MRN: [id number] DOB: Jun 25, 1941   Cancelled Treatment:     US  Guided Thoracocentesis Today.  Pt has been evaluated with rec for SNF.   Katheryn Leap  PTA Acute  Rehabilitation Services Office M-F          [phone]

## 2024-06-19 NOTE — Progress Notes (Signed)
 06/19/2024  Elizabeth Odom [id number] [birth year]  CARE TEAM: PCP: Jesus Bernardino MATSU, MD  Outpatient Care Team: Patient Care Team: Jesus Bernardino MATSU, MD as PCP - General (Internal Medicine) [name], MD as Consulting Physician (Gastroenterology) [name], MD (Neurology) [name], MD as Consulting Physician (Urology) [name], MD as Consulting Physician (Cardiology) [name], MD as Consulting Physician (Colon and Rectal Surgery)  Inpatient Treatment Team: Treatment Team:  [name], MD [name], RN [name], MD [name], MD [name], MD [name], MD [name], MD Maranda Rigoberto RAMAN, NT [name], A, OT Ragaas, Leander BIRCH, RN [name], MD Alline Omega FERNS, RN Margery Katheryn LABOR, PTA   Problem List:   Principal Problem:   Diverticular disease Active Problems:   A-fib [facility])   Restless leg   ABLA (acute blood loss anemia)   Need for emotional support   Acute urinary retention   Mixed hyperlipidemia   Essential hypertension   Diverticular disease of left colon   History of DVT (deep vein thrombosis)   History of pulmonary embolus (PE)   Pre-diabetes   Presence of IVC filter   Stricture of sigmoid colon (HCC)   Protein-calorie malnutrition, severe   Hypokalemia   S/P percutaneous endoscopic gastrostomy (PEG) tube placement [facility])   Goals of care, counseling/discussion   Shock, during or resulting from a surgical procedure   05/08/2024  POST-OPERATIVE DIAGNOSIS:   COLOSTOMY FOR RESECTION, DESIRE FOR OSTOMY TAKEDOWN RECTAL STRICTURE HISTORY OF DIVERTICULITIS WITH PERFORATION & ABSCESS   PROCEDURE:   -ROBOTIC RECTOSIGMOID RESECTION (LAR) -TAKEDOWN OF END COLOSTOMY WITH ANASTOMOSIS -RESECTION OF SMALL INTESTINE WITH ANASTOMOSIS -SMALL BOWEL REPAIR -LYSIS OF ADHESIONS x 115 MINUTES (66% OF CASE),  -INTRAOPERATIVE ASSESSMENT OF TISSUE VASCULAR PERFUSION USING ICG (indocyanine green )  IMMUNOFLUORESCENCE,  -TRANSVERSUS ABDOMINIS PLANE (TAP) BLOCK - BILATERAL -FLEXIBLE SIGMOIDOSCOPY   SURGEON:  [name], MD   05/20/2024  Post procedural Dx: Post op abscess, multiple   Technically successful CT guided placed of a  10 Fr drainage catheter placement x 2  into the midline ventral and dorsal perirectal abscesses.     [name] LABOR, MD    05/28/2024  IMPRESSION: Satisfactory removal of the midline anterior abscess drainage catheter and placement of a new midline anterior pelvic drainage catheter as well as a posterior transgluteal drainage catheter in pelvic abscesses.     Electronically Signed   By: [name]    06/03/2024  POST-OPERATIVE DIAGNOSIS:   DELAYED COLORECTAL ANASTOMOTIC LEAK, NEED FOR FECAL DIVERSION CHRONIC PELVIC ABSCESS   PROCEDURE:   DIAGNOSTIC LAPAROSCOPY CONVERTED TO LAPAROTOMY DIVERTING LOOP TRANSVERSE COLOSTOMY DRAINAGE OF ABDOMINAL ABSCESS GASTROSTOMY TUBE PLACEMENT RECTAL TUBE PLACEMENT   SURGEON:  [name], MD   OR FINDINGS: Very dense concrete intraperitoneal and pelvic adhesions infraumbilically.  Bladder, small bowel, omentum walling off the lower pelvis with delayed anastomotic leak.   Near disruption of colorectal anastomosis by transanal examination.  Rectal tube placed through rectal stump to drain the pelvis more directly.   Diverting mid-transverse loop colostomy done.   Removal of old percutaneous abdominal and transgluteal drains.     Assessment [facility] Stay = 84 days) 16 Days Post-Op    Failure to thrive with delayed anastomotic leaks     Plan:  Delayed anastomotic leaks   Delayed colorectal anastomotic leak initially controlled with bowel rest and drains since patient declined colostomy diversion.    Then drain fell out &  felt worse despite drain replacement.    Acquiesced to diverting loop transverse colostomy (lower abdomen extremely fixed).  6/25 with very fixed abdomen.     Transanal diarrhea started 7/2.    Concern by transanal diarrhea and follow-up CAT scan by my view that her ileal SBR anastomosis is leaking into the pelvis c/w internal ileorectal fistula.  Transanal diarrhea.   Flexi-Seal ordered by me 7/3 to help divert and protect the skin.    Volume tapering off with antidiarrheals.  Switching to TTO 4 times daily  Flexi-Seal removed 7/9 and switch to external catchment device.    If that fails then Flexi-Seal goes back in.  Low vol liquids for palliation, but leave gastrostomy tube to gravity.  Full TPN  Severe malnutrition   Back on TPN for now.  Infection:  Antibiotic coverage per infectious disease with daptomycin  & meropenem .    Recent left upper quadrant fluid collection drained 7/7.  Seems consistent with seroma/old hematoma.  Nothing on cultures yet.  Follow.  Midline wound superficial enough to do just packing for now   -Persistent hypoxia  Not able to wean oxygen yet.  Evidence of pulmonary effusions on prior CTs.  Chest x-ray yesterday consistent with that as well.  Medicine had debated about considering thoracentesis.  Will reach out to current internal medicine hospitalist to see if they feel it is appropriate.  They may be leaning towards ultrasound first.    Some recurrent atrial fibrillation and rapid rate.    PO/Gtube metoprolol .  Holding amlodipine  so that tamsulosin  can be given to avoid recurrent urinary retention.  Rate seems controlled with metoprolol  only.  Defer to medicine/cardiology  Acute blood loss anemia in the setting of anemia of chronic disease.    Transfused 6/26, 7/2.    Suspect she had a rectus sheath hematoma bleeding on heparin .  That is held.    Hemoglobin more stable currently reassuring.    I think she has failed full anticoagulation again.  I would be very hesitant to restart full anticoagulation w heparin  ever again given her numerous severe bleeding episodes  -despite her history of  PE/DVT (has IVC filter as a result of prior bleeding problems) and chronic A-fib.   -Duplex 7/7 probable chronic DVT on left (she complains of symptoms on right) but nothing acute  Can consider prophylactic heparin  or enoxaparin  if hemoglobin stable and no procedures for 48 hours.  See what internal medicine thinks.  No easy solutions here  No evidence of coagulopathy or thrombocytopenia.  Of note, she had a prior workup and follow-up that did not show any coagulopathy by Dr. Lanny with hematology.  May reconsult if persistent oozing/bleeding  IV iron  q week.   C/o Leg & back itching -no complaints this week.  -No evid of rash nor weakness.    -Topical steroids.    -H2 blockage - Atarax .  Lowered the dose and switch to PRN (as needed) to avoid her being as sedated.      -would like to hold off on IV steroids given her delayed anastomotic leaks, severe malnutrition, chronic infections  History of urinary retention.   Prior need for Foley catheter replacement but later removed.    With transanal diarrhea Foley catheter replaced to help clear the skin that was getting rather raw  -Foley catheter removed 7/8   switch to pure wick to avoid internal catheter   Restart tamsulosin  given some history of urine retention.  Hold amlodipine  as a compromise and follow  heart rate  -monitor electrolytes & replace as needed.  Keep K>4, Mg>2, Phos>3.  Replace phosphorus.  Pharmacy following and most likely will adjust TPN as well  -Diuresis as tolerated.  Trying to get down to her presurgical weight (~62kg)  if possible.  Follow-up with anemia needing transfusion.  -Pain control.  Scheduled Tylenol .  Was sedated so backed off on oxycodone  to just 5 mg nightly.  See if that helps her sleep.  Complain was more soreness so we will add low-dose gabapentin  and follow  -mobilize as tolerated to help recovery.  Try and get therapists involved to help her mobilize more.  Biggest challenge.    I updated  the patient's status to the patient and nurse.  Called discussed with the patient's granddaughter who closely follows her 170/1.  Recommendations were made.  Questions were answered.  They expressed understanding & appreciation.  -Disposition: TBD.   At best she will need a skilled nursing facility with bowel rest and IV TPN for many months.  Agree with palliative care.  I do not think we have a final point of futility but options are getting rather limited.  I do not think patient is ready for hospice but is frustrated and struggling.  Neither I nor granddaughter desire prolonged suffering either.    If her diarrhea/internal entero-rectal fistula does not get under control; she will need bowel rest and TPN for several months until her nutrition is normal to consider operative reexploration with lysis of adhesions takedown of fistula and conversion back to permanent colostomy versus diverting loop ileostomy.  Ideally would wait at least 6 if not 12 months given her numerous delayed leaks, deconditioned state, numerous fluid collections, etc.  See if the patient will recover enough to be involved with therapies and get stronger.  I do not know if she wants to go through all this.  She is not ready to consider hospice at this time  See if we can turn things around in this coming week.     I reviewed last 24 h vitals and pain scores, last 48 h intake and output, last 24 h labs and trends, and last 24 h imaging results.  I have reviewed this patient's available data, including medical history, events of note, test results, etc as part of my evaluation.   A significant portion of that time was spent in counseling. Care during the described time interval was provided by me.  This care required moderate level of medical decision making.  06/19/2024    Subjective: (Chief complaint)  Patient more alert today.  No complaints of itching but still with some abdominal complaints.  Tolerating liquids.   Not all she really wants.      Objective:  Vital signs:  Vitals:   06/18/24 1152 06/18/24 2207 06/19/24 0214 06/19/24 0434  BP: 118/66 (!) 149/93 133/84 (!) 151/82  Pulse: 89 93 92 92  Resp: 18 14 18 18   Temp: (!) 97.5 F (36.4 C) 98.1 F (36.7 C) 98.3 F (36.8 C) 98.3 F (36.8 C)  TempSrc: Oral Oral Oral Oral  SpO2: 97% 99% 96% 96%  Weight:      Height:        Last BM Date : 06/19/24  Intake/Output   Yesterday:  07/09 0701 - 07/10 0700 In: 1697.2 [P.O.:420; I.V.:767.8; IV Piggyback:389.4] Out: 2410 [Urine:1850; Drains:560] This shift:  No intake/output data recorded.   Bowel function:  Flatus: YES  BM:  YES -thin dark green  Drain:  Left upper quadrant percutaneous tube 7/7 thinly serosanguineous  Gastrostomy tube 6/24 with thin bilious effluent and gravity bag Foley catheter d/c'd 7/8 Rectal tube with thin green bilious effluent   Physical Exam:  General: More alert and talkative today.                Eyes: PERRL, normal EOM.  Sclera clear.  No icterus Neuro: CN II-XII intact w/o focal sensory/motor deficits.  No facial droop. Lymph: No head/neck/groin lymphadenopathy Psych:  No delerium/psychosis/paranoia.  Oriented x 4 HENT: Normocephalic, Mucus membranes moist.  No thrush.  Mild HOH Neck: Supple, No tracheal deviation.  No obvious thyromegaly.  Chest: No pain to chest wall compression.  Good respiratory excursion.  Mild conversational dyspnea and tachypnea.  No audible wheezing.   CV:  Pulses intact.  Mostly regular rhythm.  No major extremity edema MS: Normal AROM mjr joints.  No obvious deformity  Abdomen:  Most of abdomen with firmness consistent with rectus sheath abdominal wall hematoma.  Mildy distended.  Mildly tender at incisions only.  No guarding.   Right upper quadrant mid transverse diverting loop colostomy pink with edema.  No gas nor stool Midline incision with some granulation. superificial Gastrostomy tube left upper quadrant  without any leaking   GU: Pure wick in place with clear urine   Rectal: Flexi-Seal tube  - thin brown effluent.  Pruritus improved.  Skin warm dry. Ext:   No deformity.  No mjr edema.  No cyanosis Skin: No petechiae / purpurea.  No major sores.  Warm and dry    Results:   Cultures: Recent Results (from the past 720 hours)  MIC (1 Drug)-Abdominal drain abscess; 05/22/2024; Abdomen; MRSA; Daptomycin      Status: Abnormal   Collection Time: 05/22/24  1:54 PM   Specimen: Abdomen  Result Value Ref Range Status   Min Inhibitory Conc (1 Drug) Final report (A)  Corrected    Comment: (NOTE) Performed At: [facility] [address] [place], [place] [id number] Jennette Shorter MD Ey:[id number] CORRECTED ON 06/24 AT 0836: PREVIOUSLY REPORTED AS Preliminary report    Source ABSCESS  Final    Comment: Performed at [facility], [address] [place], [place] [address]  MIC Result     Status: Abnormal   Collection Time: 05/22/24  1:54 PM  Result Value Ref Range Status   Result 1 (MIC) Comment (A)  Final    Comment: (NOTE) Methicillin - resistant Staphylococcus aureus Identification performed by account, not confirmed by this laboratory. DAPTOMYCIN    <=1 UG/ML = SUSCEPTIBLE Performed At: [facility] [address] [place], [place] [id number] Jennette Shorter MD Ey:[id number]   Aerobic/Anaerobic Culture w Gram Stain (surgical/deep wound)     Status: None   Collection Time: 05/22/24  3:59 PM   Specimen: Abdomen  Result Value Ref Range Status   Specimen Description   Final    ABDOMEN Performed at [facility], [address], [place], [place] [address]    Special Requests   Final    ABDOMINAL DRAIN Performed at [facility], [address] [place], [place] [address]    Gram Stain   Final    ABUNDANT WBC PRESENT, PREDOMINANTLY PMN RARE GRAM POSITIVE COCCI RARE YEAST WITH PSEUDOHYPHAE    Culture   Final    MODERATE  PSEUDOMONAS AERUGINOSA MODERATE STAPHYLOCOCCUS AUREUS SUSCEPTIBILITIES PERFORMED ON PREVIOUS CULTURE WITHIN THE LAST 5 DAYS. MODERATE CANDIDA ALBICANS SEE SEPARATE REPORT NO ANAEROBES ISOLATED Performed at  [facility], [address] [place], [place] [address]    Report Status 06/04/2024 FINAL  Final   Organism ID, Bacteria PSEUDOMONAS AERUGINOSA  Final      Susceptibility   Pseudomonas aeruginosa - MIC*    CEFTAZIDIME 4 SENSITIVE Sensitive     CIPROFLOXACIN <=0.25 SENSITIVE Sensitive     GENTAMICIN 4 SENSITIVE Sensitive     IMIPENEM 2 SENSITIVE Sensitive     PIP/TAZO <=4 SENSITIVE Sensitive ug/mL    CEFEPIME 2 SENSITIVE Sensitive     * MODERATE PSEUDOMONAS AERUGINOSA  Aerobic/Anaerobic Culture w Gram Stain (surgical/deep wound)     Status: None   Collection Time: 05/22/24  3:59 PM   Specimen: Back  Result Value Ref Range Status   Specimen Description   Final    BACK Performed at [facility], [address] [place], [place] [address]    Special Requests   Final    TRANSGLUTEAL PERCUTANEOUS IR DRAIN Performed at [facility], [address] [place], [place] [address]    Gram Stain   Final    NO WBC SEEN ABUNDANT GRAM NEGATIVE RODS RARE GRAM POSITIVE COCCI IN PAIRS RARE BUDDING YEAST SEEN    Culture   Final    ABUNDANT PSEUDOMONAS AERUGINOSA ABUNDANT METHICILLIN RESISTANT STAPHYLOCOCCUS AUREUS ABUNDANT ENTEROCOCCUS FAECALIS ABUNDANT CANDIDA ALBICANS NO ANAEROBES ISOLATED Sent to [facility] for further susceptibility testing. Performed at [facility], [address] [place], [place] [address]    Report Status 05/27/2024 FINAL  Final   Organism ID, Bacteria PSEUDOMONAS AERUGINOSA  Final   Organism ID, Bacteria METHICILLIN RESISTANT STAPHYLOCOCCUS AUREUS  Final   Organism ID, Bacteria ENTEROCOCCUS FAECALIS  Final      Susceptibility   Enterococcus faecalis - MIC*    AMPICILLIN <=2 SENSITIVE Sensitive     VANCOMYCIN   1 SENSITIVE Sensitive     GENTAMICIN SYNERGY SENSITIVE Sensitive     * ABUNDANT ENTEROCOCCUS FAECALIS   Methicillin resistant staphylococcus aureus - MIC*    CIPROFLOXACIN >=8 RESISTANT Resistant     ERYTHROMYCIN  >=8 RESISTANT Resistant     GENTAMICIN <=0.5 SENSITIVE Sensitive     OXACILLIN >=4 RESISTANT Resistant     TETRACYCLINE <=1 SENSITIVE Sensitive     VANCOMYCIN  1 SENSITIVE Sensitive     TRIMETH/SULFA >=320 RESISTANT Resistant     CLINDAMYCIN <=0.25 SENSITIVE Sensitive     RIFAMPIN <=0.5 SENSITIVE Sensitive     Inducible Clindamycin NEGATIVE Sensitive     LINEZOLID 2 SENSITIVE Sensitive     * ABUNDANT METHICILLIN RESISTANT STAPHYLOCOCCUS AUREUS   Pseudomonas aeruginosa - MIC*    CEFTAZIDIME 4 SENSITIVE Sensitive     CIPROFLOXACIN <=0.25 SENSITIVE Sensitive     GENTAMICIN 4 SENSITIVE Sensitive     IMIPENEM 2 SENSITIVE Sensitive     PIP/TAZO <=4 SENSITIVE Sensitive ug/mL    CEFEPIME 2 SENSITIVE Sensitive     * ABUNDANT PSEUDOMONAS AERUGINOSA  Yeast Susceptibilities     Status: None   Collection Time: 05/22/24  3:59 PM  Result Value Ref Range Status   SOURCE CANDIDA ALBICAN ABD ABSC  Final    Comment: Performed at [facility], [address], [place], [place] [address]   Organism ID, Yeast Candida albicans  Final    Comment: (NOTE) Identification performed by account, not confirmed by this laboratory.    Amphotericin B MIC 0.25 ug/mL  Final    Comment: (NOTE) Breakpoints have been established for only some organism-drug combinations as  indicated. This test was developed and its performance characteristics determined by [facility]. It has not been cleared or approved by the Food and Drug Administration.    Anidulafungin MIC Comment  Final    Comment: (NOTE) 0.03 ug/mL Susceptible Breakpoints have been established for only some organism-drug combinations as indicated. This test was developed and its performance characteristics determined by [facility]. It has not  been cleared or approved by the Food and Drug Administration.    Caspofungin MIC Comment  Final    Comment: (NOTE) 0.12 ug/mL Susceptible Breakpoints have been established for only some organism-drug combinations as indicated. This test was developed and its performance characteristics determined by [facility]. It has not been cleared or approved by the Food and Drug Administration.    Fluconazole  Islt MIC 1.0 ug/mL Susceptible  Final    Comment: (NOTE) Breakpoints have been established for only some organism-drug combinations as indicated. This test was developed and its performance characteristics determined by [facility]. It has not been cleared or approved by the Food and Drug Administration.    ISAVUCONAZOLE MIC 0.008 ug/mL or less  Final    Comment: (NOTE) This test was developed and its performance characteristics determined by [facility]. It has not been cleared or approved by the Food and Drug Administration.    Itraconazole MIC 0.06 ug/mL  Final    Comment: (NOTE) Breakpoints have been established for only some organism-drug combinations as indicated. This test was developed and its performance characteristics determined by [facility]. It has not been cleared or approved by the Food and Drug Administration.    Micafungin  MIC Comment  Final    Comment: (NOTE) 0.008 ug/mL or less, Susceptible Breakpoints have been established for only some organism-drug combinations as indicated. This test was developed and its performance characteristics determined by [facility]. It has not been cleared or approved by the Food and Drug Administration.    Posaconazole MIC 0.06 ug/mL  Final    Comment: (NOTE) Breakpoints have been established for only some organism-drug combinations as indicated. This test was developed and its performance characteristics determined by [facility]. It has not been cleared or approved by the Food and Drug Administration.    REZAFUNGIN MIC Comment  Final     Comment: (NOTE) 0.15 ug/mL Susceptible This test was developed and its performance characteristics determined by [facility]. It has not been cleared or approved by the Food and Drug Administration.    Voriconazole MIC Comment  Final    Comment: (NOTE) 0.15 ug/mL Susceptible Breakpoints have been established for only some organism-drug combinations as indicated. This test was developed and its performance characteristics determined by [facility]. It has not been cleared or approved by the Food and Drug Administration. Performed At: [facility] [address] [place], [place] [id number] Jennette Shorter MD Ey:[id number]   Aerobic/Anaerobic Culture w Gram Stain (surgical/deep wound)     Status: None   Collection Time: 05/27/24  5:19 PM   Specimen: Abscess  Result Value Ref Range Status   Specimen Description   Final    ABSCESS ABDOMEN Performed at [facility], [address], [place], [place] [address]    Special Requests   Final    NONE Performed at [facility], [address], [place], [place] [address]    Gram Stain   Final    ABUNDANT WBC PRESENT, PREDOMINANTLY PMN NO ORGANISMS SEEN    Culture   Final    RARE PSEUDOMONAS AERUGINOSA RARE CANDIDA ALBICANS NO ANAEROBES ISOLATED Performed at  [facility], [address] [place], [place] [address]    Report Status 06/01/2024 FINAL  Final   Organism ID, Bacteria PSEUDOMONAS AERUGINOSA  Final      Susceptibility   Pseudomonas aeruginosa - MIC*    CEFTAZIDIME 4 SENSITIVE Sensitive     CIPROFLOXACIN <=0.25 SENSITIVE Sensitive     GENTAMICIN 4 SENSITIVE Sensitive     IMIPENEM 2 SENSITIVE Sensitive     PIP/TAZO <=4 SENSITIVE Sensitive ug/mL    CEFEPIME 2 SENSITIVE Sensitive     * RARE PSEUDOMONAS AERUGINOSA  Surgical pcr screen     Status: Abnormal   Collection Time: 06/02/24  7:55 PM   Specimen: Nasal Mucosa; Nasal Swab  Result Value Ref Range Status   MRSA, PCR POSITIVE  (A) NEGATIVE Final   Staphylococcus aureus POSITIVE (A) NEGATIVE Final    Comment: (NOTE) The Xpert SA Assay (FDA approved for NASAL specimens in patients 43 years of age and older), is one component of a comprehensive surveillance program. It is not intended to diagnose infection nor to guide or monitor treatment. Performed at [facility], [address], [place], [place] [address]   Aerobic/Anaerobic Culture w Gram Stain (surgical/deep wound)     Status: None   Collection Time: 06/11/24  4:12 PM   Specimen: Abdomen; Abdominal Fluid  Result Value Ref Range Status   Specimen Description   Final    ABDOMEN Performed at [facility], [address], [place], [place] [address]    Special Requests   Final    ABDOMINAL FLUID Performed at [facility], [address], [place], [place] [address]    Gram Stain   Final    ABUNDANT WBC PRESENT, PREDOMINANTLY PMN ABUNDANT GRAM NEGATIVE RODS FEW GRAM POSITIVE COCCI    Culture   Final    MODERATE PSEUDOMONAS AERUGINOSA Two isolates with different morphologies were identified as the same organism.The most resistant organism was reported. FEW METHICILLIN RESISTANT STAPHYLOCOCCUS AUREUS MODERATE ENTEROCOCCUS FAECALIS MODERATE CLOSTRIDIUM INNOCUUM Standardized susceptibility testing for this organism is not available. Performed at [facility], [address] [place], [place] [address]    Report Status 06/16/2024 FINAL  Final   Organism ID, Bacteria PSEUDOMONAS AERUGINOSA  Final   Organism ID, Bacteria ENTEROCOCCUS FAECALIS  Final   Organism ID, Bacteria METHICILLIN RESISTANT STAPHYLOCOCCUS AUREUS  Final      Susceptibility   Enterococcus faecalis - MIC*    AMPICILLIN <=2 SENSITIVE Sensitive     VANCOMYCIN  1 SENSITIVE Sensitive     GENTAMICIN SYNERGY SENSITIVE Sensitive     * MODERATE ENTEROCOCCUS FAECALIS   Methicillin resistant staphylococcus aureus - MIC*     CIPROFLOXACIN >=8 RESISTANT Resistant     ERYTHROMYCIN  >=8 RESISTANT Resistant     GENTAMICIN <=0.5 SENSITIVE Sensitive     OXACILLIN >=4 RESISTANT Resistant     TETRACYCLINE <=1 SENSITIVE Sensitive     VANCOMYCIN  1 SENSITIVE Sensitive     TRIMETH/SULFA >=320 RESISTANT Resistant     CLINDAMYCIN <=0.25 SENSITIVE Sensitive     RIFAMPIN <=0.5 SENSITIVE Sensitive     Inducible Clindamycin NEGATIVE Sensitive     LINEZOLID 2 SENSITIVE Sensitive     * FEW METHICILLIN RESISTANT STAPHYLOCOCCUS AUREUS   Pseudomonas aeruginosa - MIC*    CEFTAZIDIME >=64 RESISTANT Resistant     CIPROFLOXACIN 0.5 SENSITIVE Sensitive     GENTAMICIN 4 SENSITIVE Sensitive     IMIPENEM 2 SENSITIVE Sensitive     *  MODERATE PSEUDOMONAS AERUGINOSA  Aerobic/Anaerobic Culture w Gram Stain (surgical/deep wound)     Status: None (Preliminary result)   Collection Time: 06/16/24  1:08 PM   Specimen: Abscess  Result Value Ref Range Status   Specimen Description   Final    ABSCESS ABDOMEN Performed at [facility], [address], [place], [place] [address]    Special Requests   Final    NONE Performed at [facility], [address] [place], [place] [address]    Gram Stain NO WBC SEEN NO ORGANISMS SEEN   Final   Culture   Final    NO GROWTH 2 DAYS NO ANAEROBES ISOLATED; CULTURE IN PROGRESS FOR 5 DAYS Performed at [facility], [address] [place], [place] [address]    Report Status PENDING  Incomplete    Labs: Results for orders placed or performed during the hospital encounter of 05/08/24 (from the past 48 hours)  Glucose, capillary     Status: Abnormal   Collection Time: 06/17/24 11:34 AM  Result Value Ref Range   Glucose-Capillary 128 (H) 70 - 99 mg/dL    Comment: Glucose reference range applies only to samples taken after fasting for at least 8 hours.  Glucose, capillary     Status: Abnormal   Collection Time: 06/17/24  5:47 PM  Result Value Ref Range    Glucose-Capillary 128 (H) 70 - 99 mg/dL    Comment: Glucose reference range applies only to samples taken after fasting for at least 8 hours.  Glucose, capillary     Status: Abnormal   Collection Time: 06/17/24 11:37 PM  Result Value Ref Range   Glucose-Capillary 128 (H) 70 - 99 mg/dL    Comment: Glucose reference range applies only to samples taken after fasting for at least 8 hours.  Basic metabolic panel with GFR     Status: Abnormal   Collection Time: 06/18/24  3:33 AM  Result Value Ref Range   Sodium 132 (L) 135 - 145 mmol/L   Potassium 4.0 3.5 - 5.1 mmol/L   Chloride 100 98 - 111 mmol/L   CO2 26 22 - 32 mmol/L   Glucose, Bld 127 (H) 70 - 99 mg/dL    Comment: Glucose reference range applies only to samples taken after fasting for at least 8 hours.   BUN 23 8 - 23 mg/dL   Creatinine, Ser 9.61 (L) 0.44 - 1.00 mg/dL   Calcium  8.0 (L) 8.9 - 10.3 mg/dL   GFR, Estimated >39 >39 mL/min    Comment: (NOTE) Calculated using the CKD-EPI Creatinine Equation (2021)    Anion gap 6 5 - 15    Comment: Performed at [facility], [address], [place], [place] [address]  Magnesium      Status: None   Collection Time: 06/18/24  3:33 AM  Result Value Ref Range   Magnesium  2.0 1.7 - 2.4 mg/dL    Comment: Performed at [facility], [address], [place], [place] [address]  Phosphorus     Status: None   Collection Time: 06/18/24  3:33 AM  Result Value Ref Range   Phosphorus 3.2 2.5 - 4.6 mg/dL    Comment: Performed at [facility], [address], [place], [place] [address]  Glucose, capillary     Status: Abnormal   Collection Time: 06/18/24  6:15 AM  Result Value Ref Range   Glucose-Capillary 134 (H) 70 - 99 mg/dL    Comment:  Glucose reference range applies only to samples taken after fasting for at least 8 hours.  Glucose, capillary     Status: Abnormal   Collection Time: 06/18/24 11:50 AM  Result Value Ref Range    Glucose-Capillary 158 (H) 70 - 99 mg/dL    Comment: Glucose reference range applies only to samples taken after fasting for at least 8 hours.  Glucose, capillary     Status: Abnormal   Collection Time: 06/18/24  5:43 PM  Result Value Ref Range   Glucose-Capillary 131 (H) 70 - 99 mg/dL    Comment: Glucose reference range applies only to samples taken after fasting for at least 8 hours.  Glucose, capillary     Status: Abnormal   Collection Time: 06/19/24 12:01 AM  Result Value Ref Range   Glucose-Capillary 136 (H) 70 - 99 mg/dL    Comment: Glucose reference range applies only to samples taken after fasting for at least 8 hours.  Comprehensive metabolic panel     Status: Abnormal   Collection Time: 06/19/24  3:05 AM  Result Value Ref Range   Sodium 129 (L) 135 - 145 mmol/L   Potassium 4.0 3.5 - 5.1 mmol/L   Chloride 98 98 - 111 mmol/L   CO2 24 22 - 32 mmol/L   Glucose, Bld 118 (H) 70 - 99 mg/dL    Comment: Glucose reference range applies only to samples taken after fasting for at least 8 hours.   BUN 25 (H) 8 - 23 mg/dL   Creatinine, Ser 9.55 0.44 - 1.00 mg/dL   Calcium  7.9 (L) 8.9 - 10.3 mg/dL   Total Protein 7.0 6.5 - 8.1 g/dL   Albumin  1.9 (L) 3.5 - 5.0 g/dL   AST 32 15 - 41 U/L   ALT 29 0 - 44 U/L   Alkaline Phosphatase 255 (H) 38 - 126 U/L   Total Bilirubin 1.9 (H) 0.0 - 1.2 mg/dL   GFR, Estimated >39 >39 mL/min    Comment: (NOTE) Calculated using the CKD-EPI Creatinine Equation (2021)    Anion gap 7 5 - 15    Comment: Performed at [facility], [address], [place], [place] [address]  Magnesium      Status: None   Collection Time: 06/19/24  3:05 AM  Result Value Ref Range   Magnesium  2.0 1.7 - 2.4 mg/dL    Comment: Performed at [facility], [address], [place], [place] [address]  Phosphorus     Status: None   Collection Time: 06/19/24  3:05 AM  Result Value Ref Range   Phosphorus 3.0 2.5 - 4.6 mg/dL    Comment: Performed at  [facility], [address] [place], [place] [address]    Imaging / Studies: DG Chest Port 1 View Result Date: 06/18/2024 CLINICAL DATA:  Right pleural effusion. EXAM: PORTABLE CHEST 1 VIEW COMPARISON:  June 03, 2024. FINDINGS: Stable cardiomediastinal silhouette. Increased left perihilar and basilar opacity is noted concerning for pneumonia or edema with associated effusion. Right lung is unremarkable. Right-sided PICC line is noted with distal tip in expected position of the SVC. Bony thorax is unremarkable. IMPRESSION: Increased left lung opacity as noted above concerning for pneumonia or edema with associated effusion. Electronically Signed   By: Lynwood Landy Raddle M.D.   On: 06/18/2024 10:35         Medications / Allergies: per chart  Antibiotics: Anti-infectives (From admission, onward)    Start     Dose/Rate Route  Frequency Ordered Stop   06/15/24 1600  meropenem  (MERREM ) 1 g in sodium chloride  0.9 % 100 mL IVPB        1 g 200 mL/hr over 30 Minutes Intravenous Every 8 hours 06/15/24 1510     06/15/24 1600  DAPTOmycin  (CUBICIN ) IVPB 700 mg/100mL premix        700 mg 200 mL/hr over 30 Minutes Intravenous Daily 06/15/24 1510     06/11/24 1545  piperacillin -tazobactam (ZOSYN ) IVPB 3.375 g  Status:  Discontinued        3.375 g 12.5 mL/hr over 240 Minutes Intravenous Every 8 hours 06/11/24 1457 06/15/24 1510   06/09/24 1745  DAPTOmycin  (CUBICIN ) IVPB 500 mg/37mL premix  Status:  Discontinued        500 mg 100 mL/hr over 30 Minutes Intravenous Daily 06/09/24 1647 06/13/24 1539   06/06/24 1000  micafungin  (MYCAMINE ) 100 mg in sodium chloride  0.9 % 100 mL IVPB  Status:  Discontinued        100 mg 105 mL/hr over 1 Hours Intravenous Every 24 hours 06/05/24 1349 06/12/24 1142   06/04/24 1400  piperacillin -tazobactam (ZOSYN ) IVPB 3.375 g        3.375 g 12.5 mL/hr over 240 Minutes Intravenous Every 8 hours 06/04/24 0738 06/09/24 2124   06/04/24 1400  DAPTOmycin   (CUBICIN ) IVPB 500 mg/56mL premix        8 mg/kg  60.9 kg 100 mL/hr over 30 Minutes Intravenous Daily 06/04/24 0738 06/10/24 0126   06/03/24 1000  neomycin  (MYCIFRADIN ) tablet 500 mg  Status:  Discontinued        500 mg Oral 3 times daily 06/03/24 0935 06/03/24 1555   06/03/24 1000  metroNIDAZOLE  (FLAGYL ) tablet 500 mg  Status:  Discontinued       Note to Pharmacy: Take 2 pills (=1000mg ) by mouth at 1pm, 3pm, and 10pm the day before your colorectal operation   500 mg Oral 3 times daily 06/03/24 0935 06/03/24 1555   05/29/24 1500  DAPTOmycin  (CUBICIN ) IVPB 500 mg/50mL premix  Status:  Discontinued        8 mg/kg  60.9 kg 100 mL/hr over 30 Minutes Intravenous Daily 05/29/24 1359 06/04/24 0738   05/28/24 2100  vancomycin  (VANCOREADY) IVPB 750 mg/150 mL  Status:  Discontinued        750 mg 150 mL/hr over 60 Minutes Intravenous Every 12 hours 05/28/24 1419 05/29/24 1359   05/27/24 0800  vancomycin  (VANCOCIN ) IVPB 1000 mg/200 mL premix  Status:  Discontinued        1,000 mg 200 mL/hr over 60 Minutes Intravenous Every 24 hours 05/26/24 1144 05/28/24 1419   05/23/24 1400  piperacillin -tazobactam (ZOSYN ) IVPB 3.375 g  Status:  Discontinued        3.375 g 12.5 mL/hr over 240 Minutes Intravenous Every 8 hours 05/23/24 0804 06/04/24 0738   05/23/24 1200  micafungin  (MYCAMINE ) 100 mg in sodium chloride  0.9 % 100 mL IVPB        100 mg 105 mL/hr over 1 Hours Intravenous Every 24 hours 05/23/24 0755 06/05/24 1221   05/22/24 0800  vancomycin  (VANCOCIN ) IVPB 1000 mg/200 mL premix  Status:  Discontinued        1,000 mg 200 mL/hr over 60 Minutes Intravenous Every 24 hours 05/21/24 0634 05/26/24 0806   05/21/24 0730  vancomycin  (VANCOREADY) IVPB 1250 mg/250 mL        1,250 mg 166.7 mL/hr over 90 Minutes Intravenous  Once 05/21/24 0634 05/21/24 1043   05/19/24 1400  piperacillin -tazobactam (ZOSYN ) IVPB 3.375 g  Status:  Discontinued        3.375 g 12.5 mL/hr over 240 Minutes Intravenous Every 8 hours  05/19/24 1303 05/23/24 0804   05/09/24 0900  erythromycin  250 mg in sodium chloride  0.9 % 100 mL IVPB        250 mg 100 mL/hr over 60 Minutes Intravenous Every 8 hours 05/09/24 0732 05/11/24 0044   05/08/24 2200  cefoTEtan  (CEFOTAN ) 2 g in sodium chloride  0.9 % 100 mL IVPB        2 g 200 mL/hr over 30 Minutes Intravenous Every 12 hours 05/08/24 1759 05/09/24 0749   05/08/24 1400  neomycin  (MYCIFRADIN ) tablet 1,000 mg  Status:  Discontinued       Placed in And Linked Group   1,000 mg Oral 3 times per day 05/08/24 1119 05/08/24 1120   05/08/24 1400  metroNIDAZOLE  (FLAGYL ) tablet 1,000 mg  Status:  Discontinued       Placed in And Linked Group   1,000 mg Oral 3 times per day 05/08/24 1119 05/08/24 1120   05/08/24 1130  cefoTEtan  (CEFOTAN ) 2 g in sodium chloride  0.9 % 100 mL IVPB        2 g 200 mL/hr over 30 Minutes Intravenous On call to O.R. 05/08/24 1119 05/09/24 0749         Note: Portions of this report may have been transcribed using voice recognition software. Every effort was made to ensure accuracy; however, inadvertent computerized transcription errors may be present.   Any transcriptional errors that result from this process are unintentional.    Elspeth KYM Schultze, MD, FACS, MASCRS Esophageal, Gastrointestinal & Colorectal Surgery Robotic and Minimally Invasive Surgery  [facility] [address], [address] [place], [place] [address] [phone] Fax [phone] Main  CONTACT INFORMATION: Weekday (9AM-5PM): Call [facility] main office at [phone] Weeknight (5PM-9AM) or Weekend/Holiday: Check EPIC Web Links tab & use AMION (password  [email or web address]) for General Surgery [facility] coverage  Please, DO NOT use SecureChat  (it is not reliable communication to reach operating surgeons & will lead to a delay in care).   Epic staff messaging available for outptient concerns needing 1-2 business day response.      06/19/2024   7:07 AM

## 2024-06-19 NOTE — TOC Progression Note (Signed)
 Transition of Care [facility]) - Progression Note    Patient Details  Name: Elizabeth Odom MRN: [id number] Date of Birth: [birth year]  [facility] CM/SW Contact  Alfonse JONELLE Rex, RN Phone Number: 06/19/2024, 9:42 AM  Clinical Narrative:   Remains on TPN, iv abx, not medically ready for dc. TOC will continue to follow.     Expected Discharge Plan: (P) Skilled Nursing Facility Barriers to Discharge: (P) Continued Medical Work up, SNF Pending bed offer, Conservator, museum/gallery and Services   Discharge Planning Services: CM Consult   Living arrangements for the past 2 months: Apartment                                       Social Determinants of Health (SDOH) Interventions SDOH Screenings   Food Insecurity: No Food Insecurity (05/09/2024)  Housing: Low Risk  (05/09/2024)  Transportation Needs: No Transportation Needs (05/09/2024)  Utilities: Not At Risk (05/09/2024)  Depression (PHQ2-9): Low Risk  (01/02/2024)  Social Connections: Unknown (05/09/2024)  Tobacco Use: Low Risk  (05/08/2024)    Readmission Risk Interventions    05/12/2024    2:23 PM 05/09/2024    2:46 PM 10/19/2023    6:13 PM  Readmission Risk Prevention Plan  Transportation Screening Complete Complete Complete  PCP or Specialist Appt within 5-7 Days   Complete  PCP or Specialist Appt within 3-5 Days Complete Complete   Home Care Screening   Complete  Medication Review (RN CM)   Complete  HRI or Home Care Consult Complete Complete   Social Work Consult for Recovery Care Planning/Counseling Complete Complete   Palliative Care Screening Not Applicable Not Applicable   Medication Review Oceanographer) Complete Complete

## 2024-06-20 DIAGNOSIS — K5790 Diverticulosis of intestine, part unspecified, without perforation or abscess without bleeding: Secondary | ICD-10-CM | POA: Diagnosis not present

## 2024-06-20 LAB — BASIC METABOLIC PANEL WITH GFR
Anion gap: 8 (ref 5–15)
BUN: 27 mg/dL — ABNORMAL HIGH (ref 8–23)
CO2: 24 mmol/L (ref 22–32)
Calcium: 8.3 mg/dL — ABNORMAL LOW (ref 8.9–10.3)
Chloride: 102 mmol/L (ref 98–111)
Creatinine, Ser: 0.39 mg/dL — ABNORMAL LOW (ref 0.44–1.00)
GFR, Estimated: >60 mL/min (ref >60)
Glucose, Bld: 117 mg/dL — ABNORMAL HIGH (ref 70–99)
Potassium: 4.3 mmol/L (ref 3.5–5.1)
Sodium: 134 mmol/L — ABNORMAL LOW (ref 135–145)

## 2024-06-20 LAB — CBC
HCT: 35.8 % — ABNORMAL LOW (ref 36.0–46.0)
Hemoglobin: 11 g/dL — ABNORMAL LOW (ref 12.0–15.0)
MCH: 28.1 pg (ref 26.0–34.0)
MCHC: 30.7 g/dL (ref 30.0–36.0)
MCV: 91.6 fL (ref 80.0–100.0)
Platelets: 489 K/uL — ABNORMAL HIGH (ref 150–400)
RBC: 3.91 MIL/uL (ref 3.87–5.11)
RDW: 17.7 % — ABNORMAL HIGH (ref 11.5–15.5)
WBC: 11.1 K/uL — ABNORMAL HIGH (ref 4.0–10.5)
nRBC: 0 % (ref 0.0–0.2)

## 2024-06-20 LAB — MAGNESIUM: Magnesium: 2.1 mg/dL (ref 1.7–2.4)

## 2024-06-20 LAB — ACID FAST SMEAR (AFB, MYCOBACTERIA): Acid Fast Smear: NEGATIVE

## 2024-06-20 LAB — PHOSPHORUS: Phosphorus: 3 mg/dL (ref 2.5–4.6)

## 2024-06-20 MED ORDER — GABAPENTIN 100 MG PO CAPS
200.0000 mg | ORAL_CAPSULE | Freq: Three times a day (TID) | ORAL | Status: DC
  Administered 2024-06-20 – 2024-06-22 (×7): 200 mg via ORAL
  Filled 2024-06-20 (×9): qty 2

## 2024-06-20 MED ORDER — OPIUM 10 MG/ML (1%) PO TINC
6.0000 mg | Freq: Four times a day (QID) | ORAL | Status: DC
  Administered 2024-06-20 – 2024-06-25 (×18): 6 mg
  Filled 2024-06-20 (×18): qty 1

## 2024-06-20 MED ORDER — METOPROLOL TARTRATE 12.5 MG HALF TABLET
12.5000 mg | ORAL_TABLET | Freq: Two times a day (BID) | ORAL | Status: DC
  Administered 2024-06-20 – 2024-06-24 (×9): 12.5 mg
  Filled 2024-06-20 (×10): qty 1

## 2024-06-20 MED ORDER — TRAVASOL 10 % IV SOLN
INTRAVENOUS | Status: AC
  Filled 2024-06-20: qty 940.8

## 2024-06-20 NOTE — Progress Notes (Signed)
 06/20/2024  Elizabeth Odom [id number] 01-Jul-1941  CARE TEAM: PCP: Jesus Bernardino MATSU, MD  Outpatient Care Team: Patient Care Team: Jesus Bernardino MATSU, MD as PCP - General (Internal Medicine) [name], MD as Consulting Physician (Gastroenterology) [name], MD (Neurology) [name], MD as Consulting Physician (Urology) [name], MD as Consulting Physician (Cardiology) [name], MD as Consulting Physician (Colon and Rectal Surgery)  Inpatient Treatment Team: Treatment Team:  [name], MD [name], RN [name], MD [name], MD [name], MD [name], MD [name], MD [name], MD [name], NT [name], [name], RN [name], A, OT [name], RN [name], OT   Problem List:   Principal Problem:   Diverticular disease Active Problems:   A-fib [facility])   Restless leg   ABLA (acute blood loss anemia)   Need for emotional support   Acute urinary retention   Mixed hyperlipidemia   Essential hypertension   Diverticular disease of left colon   History of DVT (deep vein thrombosis)   History of pulmonary embolus (PE)   Pre-diabetes   Presence of IVC filter   Stricture of sigmoid colon (HCC)   Protein-calorie malnutrition, severe   Hypokalemia   S/P percutaneous endoscopic gastrostomy (PEG) tube placement [facility])   Goals of care, counseling/discussion   Shock, during or resulting from a surgical procedure   05/08/2024  POST-OPERATIVE DIAGNOSIS:   COLOSTOMY FOR RESECTION, DESIRE FOR OSTOMY TAKEDOWN RECTAL STRICTURE HISTORY OF DIVERTICULITIS WITH PERFORATION & ABSCESS   PROCEDURE:   -ROBOTIC RECTOSIGMOID RESECTION (LAR) -TAKEDOWN OF END COLOSTOMY WITH ANASTOMOSIS -RESECTION OF SMALL INTESTINE WITH ANASTOMOSIS -SMALL BOWEL REPAIR -LYSIS OF ADHESIONS x 115 MINUTES (66% OF CASE),  -INTRAOPERATIVE ASSESSMENT OF TISSUE VASCULAR PERFUSION USING ICG (indocyanine green )  IMMUNOFLUORESCENCE,  -TRANSVERSUS ABDOMINIS PLANE (TAP) BLOCK - BILATERAL -FLEXIBLE SIGMOIDOSCOPY   SURGEON:  [name], MD   05/20/2024  Post procedural Dx: Post op abscess, multiple   Technically successful CT guided placed of a  10 Fr drainage catheter placement x 2  into the midline ventral and dorsal perirectal abscesses.     [name] LABOR, MD    05/28/2024  IMPRESSION: Satisfactory removal of the midline anterior abscess drainage catheter and placement of a new midline anterior pelvic drainage catheter as well as a posterior transgluteal drainage catheter in pelvic abscesses.     Electronically Signed   By: [name]    06/03/2024  POST-OPERATIVE DIAGNOSIS:   DELAYED COLORECTAL ANASTOMOTIC LEAK, NEED FOR FECAL DIVERSION CHRONIC PELVIC ABSCESS   PROCEDURE:   DIAGNOSTIC LAPAROSCOPY CONVERTED TO LAPAROTOMY DIVERTING LOOP TRANSVERSE COLOSTOMY DRAINAGE OF ABDOMINAL ABSCESS GASTROSTOMY TUBE PLACEMENT RECTAL TUBE PLACEMENT   SURGEON:  [name], MD   OR FINDINGS: Very dense concrete intraperitoneal and pelvic adhesions infraumbilically.  Bladder, small bowel, omentum walling off the lower pelvis with delayed anastomotic leak.   Near disruption of colorectal anastomosis by transanal examination.  Rectal tube placed through rectal stump to drain the pelvis more directly.   Diverting mid-transverse loop colostomy done.   Removal of old percutaneous abdominal and transgluteal drains.     Assessment [facility] Stay = 43 days) 17 Days Post-Op    Failure to thrive with delayed anastomotic leaks     Plan:  Delayed anastomotic leaks   Delayed colorectal anastomotic leak initially controlled with bowel rest and drains since patient declined colostomy diversion.    Then drain fell out &  felt worse despite drain replacement.    Acquiesced to diverting loop transverse colostomy (lower abdomen extremely fixed).  6/25 with very fixed abdomen.     Transanal diarrhea started 7/2.    Concern by transanal diarrhea and follow-up CAT scan by my view that her ileal SBR anastomosis is leaking into the pelvis c/w internal ileorectal fistula.  Transanal diarrhea.   Flexi-Seal ordered by me 7/3 to help divert and protect the skin.    Volume tapering off with antidiarrheals.  DTO (deodorized tincture of opium ).  INc TID to QID w loose BM this AM  Flexi-Seal removed 7/9 and switch to external catchment device.    If that fails then Flexi-Seal goes back in.  Low vol liquids for palliation, but leave gastrostomy tube to gravity.  Full TPN  Severe malnutrition   Back on TPN for now.  Infection:  Antibiotic coverage per infectious disease with daptomycin  & meropenem .    Recent left upper quadrant fluid collection drained 7/7.  Seems consistent with seroma/old hematoma.  Nothing on cultures yet.  Follow.  Midline wound superficial enough to do just packing for now   -Persistent hypoxia   Thoracentesis done yesterday.  Seems to have less hypoxia.  Defer follow-up on cultures and pulmonary management to medicine service.    Some recurrent atrial fibrillation and rapid rate.    PO/Gtube metoprolol .  Holding amlodipine  so that tamsulosin  can be given to avoid recurrent urinary retention.    Rate seems controlled with metoprolol  only.  Blood pressure more soft today, so I cut back on metoprolol  but see what medicine thinks.  Defer to medicine/cardiology  Acute blood loss anemia in the setting of anemia of chronic disease.    Transfused 6/26, 7/2.    Suspect she had a rectus sheath hematoma bleeding on heparin .  That is held.    Hemoglobin more stable currently reassuring.    I would be very hesitant to restart full anticoagulation w heparin  ever again given her numerous severe bleeding episodes  -despite her history of PE/DVT (has IVC filter as a result of prior bleeding problems) and chronic A-fib.   -Duplex 7/7 probable  chronic DVT on left (she complains of symptoms on right) but nothing acute  Can consider prophylactic heparin  or enoxaparin  if hemoglobin stable and no procedures for 48 hours.  See what internal medicine thinks.  No easy solutions here  No evidence of coagulopathy or thrombocytopenia.  Of note, she had a prior workup and follow-up that did not show any coagulopathy by Dr. Lanny with hematology.  May reconsult if persistent oozing/bleeding  IV iron  q week.   C/o Leg & back itching -no complaints this week.  -No evid of rash nor weakness.    -Topical steroids.    -H2 blockage - Atarax .  Lowered the dose and switch to PRN (as needed) to avoid her being as sedated.      -would like to hold off on IV steroids given her delayed anastomotic leaks, severe malnutrition, chronic infections  History of urinary retention.   Prior need for Foley catheter replacement but later removed.    With transanal diarrhea Foley catheter replaced to help clear the skin that was getting rather raw  -Foley catheter removed 7/8   switch to pure wick to avoid internal catheter   Restart tamsulosin  given some history of urine retention.  Hold amlodipine  as a compromise and follow heart rate  -monitor electrolytes & replace as needed.  Keep K>4, Mg>2, Phos>3.  Replace phosphorus.  Pharmacy following and most likely will adjust TPN as well  -Diuresis as tolerated.  Had been on furosemide .  Just got thoracentesis.  Holding off for now.  Defer to medicine.    -Pain control.  A constant challenge since she is either very sleepy and sedated or complaining of severe pain. -Scheduled Tylenol .   -Gradually increase gabapentin   -was sedated so backed off on oxycodone  to just 5 mg nightly.  Can have that PRN (as needed).  I think patient likes Dilaudid  but it makes her sleepy.  Use that as a last resort.  -mobilize as tolerated to help recovery.  Try and get therapists involved to help her mobilize more.  Patient  continues to decline any therapy even though she tells me she will help.  Biggest challenge.    I updated the patient's status to the patient and nurse.  Called discussed with the patient's granddaughter who closely follows her 170/1.  Recommendations were made.  Questions were answered.  They expressed understanding & appreciation.  -Disposition: TBD.   At best she will need a skilled nursing facility with bowel rest and IV TPN for many months.  Agree with palliative care.  I worry that if she does not rally in this coming week that we may more strongly have to consider hospice palliative care.  If her diarrhea/internal entero-rectal fistula does not get under control; she will need bowel rest and TPN for several months until her nutrition is normal to consider operative reexploration with lysis of adhesions takedown of fistula and conversion back to permanent colostomy versus diverting loop ileostomy.  Ideally would wait at least 6 if not 12 months given her numerous delayed leaks, deconditioned state, numerous fluid collections, etc.  See if the patient will recover enough to be involved with therapies and get stronger.  I do not know if she wants to go through all this.  She is not ready to consider hospice at this time  See if we can turn things around in this coming week.     I reviewed last 24 h vitals and pain scores, last 48 h intake and output, last 24 h labs and trends, and last 24 h imaging results.  I have reviewed this patient's available data, including medical history, events of note, test results, etc as part of my evaluation.   A significant portion of that time was spent in counseling. Care during the described time interval was provided by me.  This care required moderate level of medical decision making.  06/20/2024    Subjective: (Chief complaint)  Patient with more pain complaints but better now.  Denies any diarrhea but nurse notes they had to clean her  up.  Keeping liquids down denies nausea.  No complaints of itching or leg pain/soreness today.  Declining offers of exercise and therapy.      Objective:  Vital signs:  Vitals:   06/19/24 1909 06/19/24 2052 06/20/24 0154 06/20/24 0511  BP: (!) 122/52 (!) 149/74 112/72 (!) 158/91  Pulse: (!) 52 92 87 98  Resp: 18 16 16 18   Temp:  98 F (36.7 C) 98.4 F (36.9 C)   TempSrc:  Oral Oral Oral  SpO2: 97% 96% 96% 97%  Weight:      Height:        Last BM Date : 06/19/24  Intake/Output   Yesterday:  07/10 0701 - 07/11 0700 In: 2335.1 [P.O.:60; I.V.:1665.1; IV Piggyback:410] Out: 3110 [Urine:2500; Drains:610] This shift:  No intake/output data recorded.   Bowel function:  Flatus: YES  BM:  YES -scant stool and flatus in colostomy bag which is better than the past few days   Drain:  Left upper quadrant percutaneous tube 7/7 thinly serosanguineous  Gastrostomy tube 6/24 with thin bilious effluent and gravity bag Foley catheter d/c'd 7/8    Physical Exam:  General: More alert and talkative today.                Eyes: PERRL, normal EOM.  Sclera clear.  No icterus Neuro: CN II-XII intact w/o focal sensory/motor deficits.  No facial droop. Lymph: No head/neck/groin lymphadenopathy Psych:  No delerium/psychosis/paranoia.  Oriented x 4 HENT: Normocephalic, Mucus membranes moist.  No thrush.  Mild HOH Neck: Supple, No tracheal deviation.  No obvious thyromegaly.  Chest: No pain to chest wall compression.  Good respiratory excursion.  Mild conversational dyspnea and tachypnea.  No audible wheezing.   CV:  Pulses intact.  Mostly regular rhythm.  No major extremity edema MS: Normal AROM mjr joints.  No obvious deformity  Abdomen:  Most of abdomen with firmness consistent with rectus sheath abdominal wall hematoma.  Right side of abdomen softer.  Moderately distended.  Mildly tender at incisions only.  No guarding.   Right upper quadrant mid transverse diverting loop  colostomy pink with edema.  No gas nor stool Midline incision with some granulation. superificial Gastrostomy tube left upper quadrant without any leaking   GU: Pure wick in place with clear urine   Rectal: Flexi-Seal tube  - thin brown effluent.  Pruritus improved.  Skin warm dry. Ext:   No deformity.  No mjr edema.  No cyanosis Skin: No petechiae / purpurea.  No major sores.  Warm and dry    Results:   Cultures: Recent Results (from the past 720 hours)  MIC (1 Drug)-Abdominal drain abscess; 05/22/2024; Abdomen; MRSA; Daptomycin      Status: Abnormal   Collection Time: 05/22/24  1:54 PM   Specimen: Abdomen  Result Value Ref Range Status   Min Inhibitory Conc (1 Drug) Final report (A)  Corrected    Comment: (NOTE) Performed At: [facility] [address] [place], [place] [id number] Jennette Shorter MD Ey:[id number] CORRECTED ON 06/24 AT 0836: PREVIOUSLY REPORTED AS Preliminary report    Source ABSCESS  Final    Comment: Performed at [facility], [address] [place], [place] [address]  MIC Result     Status: Abnormal   Collection Time: 05/22/24  1:54 PM  Result Value Ref Range Status   Result 1 (MIC) Comment (A)  Final    Comment: (NOTE) Methicillin - resistant Staphylococcus aureus Identification performed by account, not confirmed by this laboratory. DAPTOMYCIN    <=1 UG/ML = SUSCEPTIBLE Performed At: [facility] [address] [place], [place] [id number] Jennette Shorter MD Ey:[id number]   Aerobic/Anaerobic Culture w Gram Stain (surgical/deep wound)     Status: None   Collection Time: 05/22/24  3:59 PM   Specimen: Abdomen  Result Value Ref Range Status   Specimen Description   Final    ABDOMEN Performed at [facility], [address], [place], [place] [address]    Special Requests   Final    ABDOMINAL DRAIN Performed at [facility], [address], [place], [place] [address]    Gram Stain   Final     ABUNDANT WBC PRESENT, PREDOMINANTLY PMN RARE GRAM POSITIVE COCCI RARE YEAST WITH PSEUDOHYPHAE  Culture   Final    MODERATE PSEUDOMONAS AERUGINOSA MODERATE STAPHYLOCOCCUS AUREUS SUSCEPTIBILITIES PERFORMED ON PREVIOUS CULTURE WITHIN THE LAST 5 DAYS. MODERATE CANDIDA ALBICANS SEE SEPARATE REPORT NO ANAEROBES ISOLATED Performed at [facility], [address] [place], [place] [address]    Report Status 06/04/2024 FINAL  Final   Organism ID, Bacteria PSEUDOMONAS AERUGINOSA  Final      Susceptibility   Pseudomonas aeruginosa - MIC*    CEFTAZIDIME 4 SENSITIVE Sensitive     CIPROFLOXACIN <=0.25 SENSITIVE Sensitive     GENTAMICIN 4 SENSITIVE Sensitive     IMIPENEM 2 SENSITIVE Sensitive     PIP/TAZO <=4 SENSITIVE Sensitive ug/mL    CEFEPIME 2 SENSITIVE Sensitive     * MODERATE PSEUDOMONAS AERUGINOSA  Aerobic/Anaerobic Culture w Gram Stain (surgical/deep wound)     Status: None   Collection Time: 05/22/24  3:59 PM   Specimen: Back  Result Value Ref Range Status   Specimen Description   Final    BACK Performed at [facility], [address] [place], [place] [address]    Special Requests   Final    TRANSGLUTEAL PERCUTANEOUS IR DRAIN Performed at [facility], [address], [place], [place] [address]    Gram Stain   Final    NO WBC SEEN ABUNDANT GRAM NEGATIVE RODS RARE GRAM POSITIVE COCCI IN PAIRS RARE BUDDING YEAST SEEN    Culture   Final    ABUNDANT PSEUDOMONAS AERUGINOSA ABUNDANT METHICILLIN RESISTANT STAPHYLOCOCCUS AUREUS ABUNDANT ENTEROCOCCUS FAECALIS ABUNDANT CANDIDA ALBICANS NO ANAEROBES ISOLATED Sent to [facility] for further susceptibility testing. Performed at [facility], [address], [place], [place] [address]    Report Status 05/27/2024 FINAL  Final   Organism ID, Bacteria PSEUDOMONAS AERUGINOSA  Final   Organism ID, Bacteria METHICILLIN RESISTANT STAPHYLOCOCCUS AUREUS  Final   Organism ID, Bacteria  ENTEROCOCCUS FAECALIS  Final      Susceptibility   Enterococcus faecalis - MIC*    AMPICILLIN <=2 SENSITIVE Sensitive     VANCOMYCIN  1 SENSITIVE Sensitive     GENTAMICIN SYNERGY SENSITIVE Sensitive     * ABUNDANT ENTEROCOCCUS FAECALIS   Methicillin resistant staphylococcus aureus - MIC*    CIPROFLOXACIN >=8 RESISTANT Resistant     ERYTHROMYCIN  >=8 RESISTANT Resistant     GENTAMICIN <=0.5 SENSITIVE Sensitive     OXACILLIN >=4 RESISTANT Resistant     TETRACYCLINE <=1 SENSITIVE Sensitive     VANCOMYCIN  1 SENSITIVE Sensitive     TRIMETH/SULFA >=320 RESISTANT Resistant     CLINDAMYCIN <=0.25 SENSITIVE Sensitive     RIFAMPIN <=0.5 SENSITIVE Sensitive     Inducible Clindamycin NEGATIVE Sensitive     LINEZOLID 2 SENSITIVE Sensitive     * ABUNDANT METHICILLIN RESISTANT STAPHYLOCOCCUS AUREUS   Pseudomonas aeruginosa - MIC*    CEFTAZIDIME 4 SENSITIVE Sensitive     CIPROFLOXACIN <=0.25 SENSITIVE Sensitive     GENTAMICIN 4 SENSITIVE Sensitive     IMIPENEM 2 SENSITIVE Sensitive     PIP/TAZO <=4 SENSITIVE Sensitive ug/mL    CEFEPIME 2 SENSITIVE Sensitive     * ABUNDANT PSEUDOMONAS AERUGINOSA  Yeast Susceptibilities     Status: None   Collection Time: 05/22/24  3:59 PM  Result Value Ref Range Status   SOURCE CANDIDA ALBICAN ABD ABSC  Final    Comment: Performed at [facility], [address], [place], [place] [address]   Organism ID, Yeast Candida albicans  Final    Comment: (NOTE)  Identification performed by account, not confirmed by this laboratory.    Amphotericin B MIC 0.25 ug/mL  Final    Comment: (NOTE) Breakpoints have been established for only some organism-drug combinations as indicated. This test was developed and its performance characteristics determined by [facility]. It has not been cleared or approved by the Food and Drug Administration.    Anidulafungin MIC Comment  Final    Comment: (NOTE) 0.03 ug/mL Susceptible Breakpoints have been established for only some  organism-drug combinations as indicated. This test was developed and its performance characteristics determined by [facility]. It has not been cleared or approved by the Food and Drug Administration.    Caspofungin MIC Comment  Final    Comment: (NOTE) 0.12 ug/mL Susceptible Breakpoints have been established for only some organism-drug combinations as indicated. This test was developed and its performance characteristics determined by [facility]. It has not been cleared or approved by the Food and Drug Administration.    Fluconazole  Islt MIC 1.0 ug/mL Susceptible  Final    Comment: (NOTE) Breakpoints have been established for only some organism-drug combinations as indicated. This test was developed and its performance characteristics determined by [facility]. It has not been cleared or approved by the Food and Drug Administration.    ISAVUCONAZOLE MIC 0.008 ug/mL or less  Final    Comment: (NOTE) This test was developed and its performance characteristics determined by [facility]. It has not been cleared or approved by the Food and Drug Administration.    Itraconazole MIC 0.06 ug/mL  Final    Comment: (NOTE) Breakpoints have been established for only some organism-drug combinations as indicated. This test was developed and its performance characteristics determined by [facility]. It has not been cleared or approved by the Food and Drug Administration.    Micafungin  MIC Comment  Final    Comment: (NOTE) 0.008 ug/mL or less, Susceptible Breakpoints have been established for only some organism-drug combinations as indicated. This test was developed and its performance characteristics determined by [facility]. It has not been cleared or approved by the Food and Drug Administration.    Posaconazole MIC 0.06 ug/mL  Final    Comment: (NOTE) Breakpoints have been established for only some organism-drug combinations as indicated. This test was developed and its performance  characteristics determined by [facility]. It has not been cleared or approved by the Food and Drug Administration.    REZAFUNGIN MIC Comment  Final    Comment: (NOTE) 0.15 ug/mL Susceptible This test was developed and its performance characteristics determined by [facility]. It has not been cleared or approved by the Food and Drug Administration.    Voriconazole MIC Comment  Final    Comment: (NOTE) 0.15 ug/mL Susceptible Breakpoints have been established for only some organism-drug combinations as indicated. This test was developed and its performance characteristics determined by [facility]. It has not been cleared or approved by the Food and Drug Administration. Performed At: [facility] [address] [place], [place] [id number] Jennette Shorter MD Ey:[id number]   Aerobic/Anaerobic Culture w Gram Stain (surgical/deep wound)     Status: None   Collection Time: 05/27/24  5:19 PM   Specimen: Abscess  Result Value Ref Range Status   Specimen Description   Final    ABSCESS ABDOMEN Performed at [facility], [address] [place], [place] [address]    Special Requests   Final    NONE Performed at [facility], [address], [place], [place] [address]    Gram Stain  Final    ABUNDANT WBC PRESENT, PREDOMINANTLY PMN NO ORGANISMS SEEN    Culture   Final    RARE PSEUDOMONAS AERUGINOSA RARE CANDIDA ALBICANS NO ANAEROBES ISOLATED Performed at [facility], [address], [place], [place] [address]    Report Status 06/01/2024 FINAL  Final   Organism ID, Bacteria PSEUDOMONAS AERUGINOSA  Final      Susceptibility   Pseudomonas aeruginosa - MIC*    CEFTAZIDIME 4 SENSITIVE Sensitive     CIPROFLOXACIN <=0.25 SENSITIVE Sensitive     GENTAMICIN 4 SENSITIVE Sensitive     IMIPENEM 2 SENSITIVE Sensitive     PIP/TAZO <=4 SENSITIVE Sensitive ug/mL    CEFEPIME 2 SENSITIVE Sensitive     * RARE PSEUDOMONAS AERUGINOSA  Surgical pcr screen      Status: Abnormal   Collection Time: 06/02/24  7:55 PM   Specimen: Nasal Mucosa; Nasal Swab  Result Value Ref Range Status   MRSA, PCR POSITIVE (A) NEGATIVE Final   Staphylococcus aureus POSITIVE (A) NEGATIVE Final    Comment: (NOTE) The Xpert SA Assay (FDA approved for NASAL specimens in patients 56 years of age and older), is one component of a comprehensive surveillance program. It is not intended to diagnose infection nor to guide or monitor treatment. Performed at [facility] [address] [place], [place] [address]   Aerobic/Anaerobic Culture w Gram Stain (surgical/deep wound)     Status: None   Collection Time: 06/11/24  4:12 PM   Specimen: Abdomen; Abdominal Fluid  Result Value Ref Range Status   Specimen Description   Final    ABDOMEN Performed at [facility], [address], [place], [place] [address]    Special Requests   Final    ABDOMINAL FLUID Performed at [facility], [address] [place], [place] [address]    Gram Stain   Final    ABUNDANT WBC PRESENT, PREDOMINANTLY PMN ABUNDANT GRAM NEGATIVE RODS FEW GRAM POSITIVE COCCI    Culture   Final    MODERATE PSEUDOMONAS AERUGINOSA Two isolates with different morphologies were identified as the same organism.The most resistant organism was reported. FEW METHICILLIN RESISTANT STAPHYLOCOCCUS AUREUS MODERATE ENTEROCOCCUS FAECALIS MODERATE CLOSTRIDIUM INNOCUUM Standardized susceptibility testing for this organism is not available. Performed at [facility], [address] [place], [place] [address]    Report Status 06/16/2024 FINAL  Final   Organism ID, Bacteria PSEUDOMONAS AERUGINOSA  Final   Organism ID, Bacteria ENTEROCOCCUS FAECALIS  Final   Organism ID, Bacteria METHICILLIN RESISTANT STAPHYLOCOCCUS AUREUS  Final      Susceptibility   Enterococcus faecalis - MIC*    AMPICILLIN <=2 SENSITIVE Sensitive     VANCOMYCIN  1 SENSITIVE Sensitive      GENTAMICIN SYNERGY SENSITIVE Sensitive     * MODERATE ENTEROCOCCUS FAECALIS   Methicillin resistant staphylococcus aureus - MIC*    CIPROFLOXACIN >=8 RESISTANT Resistant     ERYTHROMYCIN  >=8 RESISTANT Resistant     GENTAMICIN <=0.5 SENSITIVE Sensitive     OXACILLIN >=4 RESISTANT Resistant     TETRACYCLINE <=1 SENSITIVE Sensitive     VANCOMYCIN  1 SENSITIVE Sensitive     TRIMETH/SULFA >=320 RESISTANT Resistant     CLINDAMYCIN <=0.25 SENSITIVE Sensitive     RIFAMPIN <=0.5 SENSITIVE Sensitive     Inducible Clindamycin NEGATIVE Sensitive     LINEZOLID 2 SENSITIVE Sensitive     * FEW METHICILLIN RESISTANT STAPHYLOCOCCUS AUREUS   Pseudomonas aeruginosa - MIC*  CEFTAZIDIME >=64 RESISTANT Resistant     CIPROFLOXACIN 0.5 SENSITIVE Sensitive     GENTAMICIN 4 SENSITIVE Sensitive     IMIPENEM 2 SENSITIVE Sensitive     * MODERATE PSEUDOMONAS AERUGINOSA  Aerobic/Anaerobic Culture w Gram Stain (surgical/deep wound)     Status: None (Preliminary result)   Collection Time: 06/16/24  1:08 PM   Specimen: Abscess  Result Value Ref Range Status   Specimen Description   Final    ABSCESS ABDOMEN Performed at [facility], [address], [place], [place] [address]    Special Requests   Final    NONE Performed at [facility], [address], [place], [place] [address]    Gram Stain NO WBC SEEN NO ORGANISMS SEEN   Final   Culture   Final    NO GROWTH 3 DAYS NO ANAEROBES ISOLATED; CULTURE IN PROGRESS FOR 5 DAYS Performed at [facility], [address], [place], [place] [address]    Report Status PENDING  Incomplete  Body fluid culture w Gram Stain     Status: None (Preliminary result)   Collection Time: 06/19/24  3:22 PM   Specimen: PATH Cytology Pleural fluid  Result Value Ref Range Status   Specimen Description   Final    PLEURAL CYTO Performed at [facility], [address], [place], [place] [address]    Special Requests    Final    NONE Performed at [facility], [address], [place], [place] [address]    Gram Stain   Final    FEW WBC PRESENT, PREDOMINANTLY PMN NO ORGANISMS SEEN Performed at [facility], [address] [place], [place] [address]    Culture PENDING  Incomplete   Report Status PENDING  Incomplete    Labs: Results for orders placed or performed during the hospital encounter of 05/08/24 (from the past 48 hours)  Glucose, capillary     Status: Abnormal   Collection Time: 06/18/24 11:50 AM  Result Value Ref Range   Glucose-Capillary 158 (H) 70 - 99 mg/dL    Comment: Glucose reference range applies only to samples taken after fasting for at least 8 hours.  Glucose, capillary     Status: Abnormal   Collection Time: 06/18/24  5:43 PM  Result Value Ref Range   Glucose-Capillary 131 (H) 70 - 99 mg/dL    Comment: Glucose reference range applies only to samples taken after fasting for at least 8 hours.  Glucose, capillary     Status: Abnormal   Collection Time: 06/19/24 12:01 AM  Result Value Ref Range   Glucose-Capillary 136 (H) 70 - 99 mg/dL    Comment: Glucose reference range applies only to samples taken after fasting for at least 8 hours.  Comprehensive metabolic panel     Status: Abnormal   Collection Time: 06/19/24  3:05 AM  Result Value Ref Range   Sodium 129 (L) 135 - 145 mmol/L   Potassium 4.0 3.5 - 5.1 mmol/L   Chloride 98 98 - 111 mmol/L   CO2 24 22 - 32 mmol/L   Glucose, Bld 118 (H) 70 - 99 mg/dL    Comment: Glucose reference range applies only to samples taken after fasting for at least 8 hours.   BUN 25 (H) 8 - 23 mg/dL   Creatinine, Ser 9.55 0.44 - 1.00 mg/dL   Calcium  7.9 (L) 8.9 - 10.3 mg/dL   Total Protein 7.0 6.5 - 8.1 g/dL  Albumin  1.9 (L) 3.5 - 5.0 g/dL   AST 32 15 - 41 U/L   ALT 29 0 - 44 U/L   Alkaline Phosphatase 255 (H) 38 - 126 U/L   Total Bilirubin 1.9 (H) 0.0 - 1.2 mg/dL   GFR, Estimated >39 >39 mL/min    Comment:  (NOTE) Calculated using the CKD-EPI Creatinine Equation (2021)    Anion gap 7 5 - 15    Comment: Performed at [facility], [address], [place], [place] [address]  Magnesium      Status: None   Collection Time: 06/19/24  3:05 AM  Result Value Ref Range   Magnesium  2.0 1.7 - 2.4 mg/dL    Comment: Performed at [facility], [address], [place], [place] [address]  Phosphorus     Status: None   Collection Time: 06/19/24  3:05 AM  Result Value Ref Range   Phosphorus 3.0 2.5 - 4.6 mg/dL    Comment: Performed at [facility], [address] [place], [place] [address]  Glucose, capillary     Status: Abnormal   Collection Time: 06/19/24  7:18 AM  Result Value Ref Range   Glucose-Capillary 144 (H) 70 - 99 mg/dL    Comment: Glucose reference range applies only to samples taken after fasting for at least 8 hours.  Lactate dehydrogenase (pleural or peritoneal fluid)     Status: Abnormal   Collection Time: 06/19/24  3:22 PM  Result Value Ref Range   LD, Fluid 257 (H) 3 - 23 U/L    Comment: (NOTE) Results should be evaluated in conjunction with serum values    Fluid Type-FLDH CYTO PLEU     Comment: Performed at [facility], [address], [place], [place] [address]  Body fluid cell count with differential     Status: Abnormal   Collection Time: 06/19/24  3:22 PM  Result Value Ref Range   Fluid Type-FCT CYTO PLEU    Color, Fluid RED (A) YELLOW   Appearance, Fluid TURBID (A) CLEAR   Total Nucleated Cell Count, Fluid 1,890 (H) 0 - 1,000 cu mm   Neutrophil Count, Fluid 83 (H) 0 - 25 %   Lymphs, Fluid 12 %   Monocyte-Macrophage-Serous Fluid 4 (L) 50 - 90 %   Eos, Fluid 1 %   Other Cells, Fluid OCCASIONAL BASOPHIL %    Comment: OTHER CELLS IDENTIFIED AS MESOTHELIAL CELLS CORRELATE WITH CYTOLOGY. Performed at [facility], [address], [place], [place] [address]   Protein, pleural or  peritoneal fluid     Status: None   Collection Time: 06/19/24  3:22 PM  Result Value Ref Range   Total protein, fluid 3.5 g/dL    Comment: (NOTE) No normal range established for this test Results should be evaluated in conjunction with serum values    Fluid Type-FTP CYTO PLEU     Comment: Performed at [facility], [address], [place], [place] [address]  Body fluid culture w Gram Stain     Status: None (Preliminary result)   Collection Time: 06/19/24  3:22 PM   Specimen: PATH Cytology Pleural fluid  Result Value Ref Range   Specimen Description      PLEURAL CYTO Performed at [facility], [address], [place], [place] [address]    Special Requests      NONE Performed at [facility], [address], [place], [place] [address]  Gram Stain      FEW WBC PRESENT, PREDOMINANTLY PMN NO ORGANISMS SEEN Performed at [facility], [address], [place], [place] [address]    Culture PENDING    Report Status PENDING   Basic metabolic panel with GFR     Status: Abnormal   Collection Time: 06/20/24  4:36 AM  Result Value Ref Range   Sodium 134 (L) 135 - 145 mmol/L   Potassium 4.3 3.5 - 5.1 mmol/L   Chloride 102 98 - 111 mmol/L   CO2 24 22 - 32 mmol/L   Glucose, Bld 117 (H) 70 - 99 mg/dL    Comment: Glucose reference range applies only to samples taken after fasting for at least 8 hours.   BUN 27 (H) 8 - 23 mg/dL   Creatinine, Ser 9.60 (L) 0.44 - 1.00 mg/dL   Calcium  8.3 (L) 8.9 - 10.3 mg/dL   GFR, Estimated >39 >39 mL/min    Comment: (NOTE) Calculated using the CKD-EPI Creatinine Equation (2021)    Anion gap 8 5 - 15    Comment: Performed at [facility], [address], [place], [place] [address]  Magnesium      Status: None   Collection Time: 06/20/24  4:36 AM  Result Value Ref Range   Magnesium  2.1 1.7 - 2.4 mg/dL    Comment: Performed at [facility], [address], [place], [place] [address]  Phosphorus     Status: None   Collection Time: 06/20/24  4:36 AM  Result Value Ref Range   Phosphorus 3.0 2.5 - 4.6 mg/dL    Comment: Performed at [facility], [address], [place], [place] [address]  CBC     Status: Abnormal   Collection Time: 06/20/24  4:36 AM  Result Value Ref Range   WBC 11.1 (H) 4.0 - 10.5 K/uL   RBC 3.91 3.87 - 5.11 MIL/uL   Hemoglobin 11.0 (L) 12.0 - 15.0 g/dL   HCT 64.1 (L) 63.9 - 53.9 %   MCV 91.6 80.0 - 100.0 fL   MCH 28.1 26.0 - 34.0 pg   MCHC 30.7 30.0 - 36.0 g/dL   RDW 82.2 (H) 88.4 - 84.4 %   Platelets 489 (H) 150 - 400 K/uL   nRBC 0.0 0.0 - 0.2 %    Comment: Performed at [facility], [address] [place], [place] [address]    Imaging / Studies: US  THORACENTESIS ASP PLEURAL SPACE W/IMG GUIDE Result Date: 06/19/2024 INDICATION: 83 year old female with left pleural effusion. Request for diagnostic and therapeutic thoracentesis. EXAM: ULTRASOUND GUIDED LEFT THORACENTESIS MEDICATIONS: 5 mL 1% lidocaine  COMPLICATIONS: None immediate. PROCEDURE: An ultrasound guided thoracentesis was thoroughly discussed with the patient and questions answered. The benefits, risks, alternatives and complications were also discussed. The patient understands and wishes to proceed with the procedure. Written consent was obtained. Ultrasound was performed to localize and mark an adequate pocket of fluid in the left chest. The area was then prepped and draped in the normal sterile fashion. 1% Lidocaine  was used for local anesthesia. Under ultrasound guidance a 6 Fr Safe-T-Centesis catheter was introduced. Thoracentesis was performed. The catheter was removed and a dressing applied. FINDINGS: A total of approximately 670 mL of amber fluid was removed. Samples were sent to the laboratory as requested by the clinical team. IMPRESSION: Successful ultrasound guided left thoracentesis yielding 670 mL of pleural fluid.  Performed by: Kacie Matthews PA-C Electronically Signed   By:  Juliene Balder M.D.   On: 06/19/2024 16:18   DG CHEST PORT 1 VIEW Result Date: 06/19/2024 CLINICAL DATA:  Post thoracentesis on the left. EXAM: PORTABLE CHEST 1 VIEW COMPARISON:  Radiograph 06/18/2024 and 06/03/2024.  CT 09/30/2023. FINDINGS: 1541 hours. Right arm PICC projects to the lower SVC level. The heart size and mediastinal contours are stable. Left pleural effusion has decreased in volume. There are small residual bilateral pleural effusions with associated bibasilar pulmonary opacities. No pneumothorax. The bones appear unchanged. IMPRESSION: 1. Decreased left pleural effusion post thoracentesis. No pneumothorax. 2. Small residual bilateral pleural effusions with associated bibasilar pulmonary opacities. Electronically Signed   By: Elsie Perone M.D.   On: 06/19/2024 16:07   DG Chest Port 1 View Result Date: 06/18/2024 CLINICAL DATA:  Right pleural effusion. EXAM: PORTABLE CHEST 1 VIEW COMPARISON:  June 03, 2024. FINDINGS: Stable cardiomediastinal silhouette. Increased left perihilar and basilar opacity is noted concerning for pneumonia or edema with associated effusion. Right lung is unremarkable. Right-sided PICC line is noted with distal tip in expected position of the SVC. Bony thorax is unremarkable. IMPRESSION: Increased left lung opacity as noted above concerning for pneumonia or edema with associated effusion. Electronically Signed   By: Lynwood Landy Raddle M.D.   On: 06/18/2024 10:35         Medications / Allergies: per chart  Antibiotics: Anti-infectives (From admission, onward)    Start     Dose/Rate Route Frequency Ordered Stop   06/15/24 1600  meropenem  (MERREM ) 1 g in sodium chloride  0.9 % 100 mL IVPB        1 g 200 mL/hr over 30 Minutes Intravenous Every 8 hours 06/15/24 1510     06/15/24 1600  DAPTOmycin  (CUBICIN ) IVPB 700 mg/100mL premix        700 mg 200 mL/hr over 30 Minutes Intravenous Daily 06/15/24 1510      06/11/24 1545  piperacillin -tazobactam (ZOSYN ) IVPB 3.375 g  Status:  Discontinued        3.375 g 12.5 mL/hr over 240 Minutes Intravenous Every 8 hours 06/11/24 1457 06/15/24 1510   06/09/24 1745  DAPTOmycin  (CUBICIN ) IVPB 500 mg/50mL premix  Status:  Discontinued        500 mg 100 mL/hr over 30 Minutes Intravenous Daily 06/09/24 1647 06/13/24 1539   06/06/24 1000  micafungin  (MYCAMINE ) 100 mg in sodium chloride  0.9 % 100 mL IVPB  Status:  Discontinued        100 mg 105 mL/hr over 1 Hours Intravenous Every 24 hours 06/05/24 1349 06/12/24 1142   06/04/24 1400  piperacillin -tazobactam (ZOSYN ) IVPB 3.375 g        3.375 g 12.5 mL/hr over 240 Minutes Intravenous Every 8 hours 06/04/24 0738 06/09/24 2124   06/04/24 1400  DAPTOmycin  (CUBICIN ) IVPB 500 mg/55mL premix        8 mg/kg  60.9 kg 100 mL/hr over 30 Minutes Intravenous Daily 06/04/24 0738 06/10/24 0126   06/03/24 1000  neomycin  (MYCIFRADIN ) tablet 500 mg  Status:  Discontinued        500 mg Oral 3 times daily 06/03/24 0935 06/03/24 1555   06/03/24 1000  metroNIDAZOLE  (FLAGYL ) tablet 500 mg  Status:  Discontinued       Note to Pharmacy: Take 2 pills (=1000mg ) by mouth at 1pm, 3pm, and 10pm the day before your colorectal operation   500 mg Oral 3 times daily 06/03/24 0935 06/03/24 1555   05/29/24 1500  DAPTOmycin  (CUBICIN ) IVPB 500 mg/50mL premix  Status:  Discontinued        8 mg/kg  60.9 kg 100 mL/hr over 30 Minutes Intravenous Daily 05/29/24 1359 06/04/24 0738   05/28/24 2100  vancomycin  (VANCOREADY) IVPB 750 mg/150 mL  Status:  Discontinued        750 mg 150 mL/hr over 60 Minutes Intravenous Every 12 hours 05/28/24 1419 05/29/24 1359   05/27/24 0800  vancomycin  (VANCOCIN ) IVPB 1000 mg/200 mL premix  Status:  Discontinued        1,000 mg 200 mL/hr over 60 Minutes Intravenous Every 24 hours 05/26/24 1144 05/28/24 1419   05/23/24 1400  piperacillin -tazobactam (ZOSYN ) IVPB 3.375 g  Status:  Discontinued        3.375 g 12.5 mL/hr  over 240 Minutes Intravenous Every 8 hours 05/23/24 0804 06/04/24 0738   05/23/24 1200  micafungin  (MYCAMINE ) 100 mg in sodium chloride  0.9 % 100 mL IVPB        100 mg 105 mL/hr over 1 Hours Intravenous Every 24 hours 05/23/24 0755 06/05/24 1221   05/22/24 0800  vancomycin  (VANCOCIN ) IVPB 1000 mg/200 mL premix  Status:  Discontinued        1,000 mg 200 mL/hr over 60 Minutes Intravenous Every 24 hours 05/21/24 0634 05/26/24 0806   05/21/24 0730  vancomycin  (VANCOREADY) IVPB 1250 mg/250 mL        1,250 mg 166.7 mL/hr over 90 Minutes Intravenous  Once 05/21/24 0634 05/21/24 1043   05/19/24 1400  piperacillin -tazobactam (ZOSYN ) IVPB 3.375 g  Status:  Discontinued        3.375 g 12.5 mL/hr over 240 Minutes Intravenous Every 8 hours 05/19/24 1303 05/23/24 0804   05/09/24 0900  erythromycin  250 mg in sodium chloride  0.9 % 100 mL IVPB        250 mg 100 mL/hr over 60 Minutes Intravenous Every 8 hours 05/09/24 0732 05/11/24 0044   05/08/24 2200  cefoTEtan  (CEFOTAN ) 2 g in sodium chloride  0.9 % 100 mL IVPB        2 g 200 mL/hr over 30 Minutes Intravenous Every 12 hours 05/08/24 1759 05/09/24 0749   05/08/24 1400  neomycin  (MYCIFRADIN ) tablet 1,000 mg  Status:  Discontinued       Placed in And Linked Group   1,000 mg Oral 3 times per day 05/08/24 1119 05/08/24 1120   05/08/24 1400  metroNIDAZOLE  (FLAGYL ) tablet 1,000 mg  Status:  Discontinued       Placed in And Linked Group   1,000 mg Oral 3 times per day 05/08/24 1119 05/08/24 1120   05/08/24 1130  cefoTEtan  (CEFOTAN ) 2 g in sodium chloride  0.9 % 100 mL IVPB        2 g 200 mL/hr over 30 Minutes Intravenous On call to O.R. 05/08/24 1119 05/09/24 0749         Note: Portions of this report may have been transcribed using voice recognition software. Every effort was made to ensure accuracy; however, inadvertent computerized transcription errors may be present.   Any transcriptional errors that result from this process are  unintentional.    Elspeth KYM Schultze, MD, FACS, MASCRS Esophageal, Gastrointestinal & Colorectal Surgery Robotic and Minimally Invasive Surgery  [facility] [address] [place], [place] [address] [phone] Fax [phone] Main  CONTACT INFORMATION: Weekday (9AM-5PM): Call [facility] main office at [phone] Weeknight (5PM-9AM) or Weekend/Holiday: Check EPIC Web Links tab & use AMION (password  [facility]) for General Surgery [facility] coverage  Please, DO  NOT use SecureChat  (it is not reliable communication to reach operating surgeons & will lead to a delay in care).   Epic staff messaging available for outptient concerns needing 1-2 business day response.      06/20/2024  7:31 AM

## 2024-06-20 NOTE — TOC Progression Note (Signed)
 Transition of Care [facility]) - Progression Note    Patient Details  Name: Elizabeth Odom MRN: [id number] Date of Birth: 07/26/1941  Transition of Care [facility]) CM/SW Contact  Alfonse JONELLE Rex, RN Phone Number: 06/20/2024, 10:41 AM  Clinical Narrative:   Remains on full TPN, pain management, therapy, surgery following.  DC disposition TBD. TOC following.     Expected Discharge Plan: (P) Skilled Nursing Facility Barriers to Discharge: (P) Continued Medical Work up, SNF Pending bed offer, Conservator, museum/gallery and Services   Discharge Planning Services: CM Consult   Living arrangements for the past 2 months: Apartment                                       Social Determinants of Health (SDOH) Interventions SDOH Screenings   Food Insecurity: No Food Insecurity (05/09/2024)  Housing: Low Risk  (05/09/2024)  Transportation Needs: No Transportation Needs (05/09/2024)  Utilities: Not At Risk (05/09/2024)  Depression (PHQ2-9): Low Risk  (01/02/2024)  Social Connections: Unknown (05/09/2024)  Tobacco Use: Low Risk  (05/08/2024)    Readmission Risk Interventions    05/12/2024    2:23 PM 05/09/2024    2:46 PM 10/19/2023    6:13 PM  Readmission Risk Prevention Plan  Transportation Screening Complete Complete Complete  PCP or Specialist Appt within 5-7 Days   Complete  PCP or Specialist Appt within 3-5 Days Complete Complete   Home Care Screening   Complete  Medication Review (RN CM)   Complete  HRI or Home Care Consult Complete Complete   Social Work Consult for Recovery Care Planning/Counseling Complete Complete   Palliative Care Screening Not Applicable Not Applicable   Medication Review Oceanographer) Complete Complete

## 2024-06-20 NOTE — Progress Notes (Signed)
 OT Cancellation Note  Patient Details Name: Elizabeth Odom MRN: [id number] DOB: 01/01/1941   Cancelled Treatment:    Reason Eval/Treat Not Completed: Pain limiting ability to participate;Other (comment). Per R pt having severe pain this a.m. with anxiety. OT will follow up next available time  Jacques Karna Loose 06/20/2024, 9:53 AM

## 2024-06-20 NOTE — Progress Notes (Signed)
 Pt continues to plead we stop all interventions and let her go home. MD is aware. Encouraged pt to verbalize wishes to rounding MD tomorrow.

## 2024-06-20 NOTE — Progress Notes (Signed)
   06/20/24 0939  Vitals  Temp 98.4 F (36.9 C)  Temp Source Oral  BP (!) 99/52  MAP (mmHg) 65  BP Location Left Arm  BP Method Automatic  Patient Position (if appropriate) Lying  Pulse Rate (!) 102  Pulse Rate Source Monitor  Resp 18  Level of Consciousness  Level of Consciousness Alert  MEWS COLOR  MEWS Score Color Yellow  Oxygen Therapy  SpO2 100 %  O2 Device Room Air  Pain Assessment  Pain Scale 0-10  Pain Score 10  Pain Type Surgical pain  Pain Location Abdomen  Pain Orientation Upper;Left  Pain Descriptors / Indicators Sharp;Constant  Pain Frequency Constant  Pain Onset Progressive  Patients Stated Pain Goal 0  Pain Intervention(s) Medication (See eMAR)  Multiple Pain Sites No  POSS Scale (Pasero Opioid Sedation Scale)  POSS *See Group Information* 1-Acceptable,Awake and alert  Complaints & Interventions  Neuro symptoms relieved by Rest;Relaxation techniques (Comment)  Diarrhea relieved by Nothing  PCA/Epidural/Spinal Assessment  Respiratory Pattern Regular;Unlabored  Glasgow Coma Scale  Eye Opening 4  Best Verbal Response (NON-intubated) 5  Best Motor Response 6  Glasgow Coma Scale Score 15  MEWS Score  MEWS Temp 0  MEWS Systolic 1  MEWS Pulse 1  MEWS RR 0  MEWS LOC 0  MEWS Score 2  Provider Notification  Provider Name/Title Elspeth Schultze, MD [facility] // Nena Rebel, MD [facility]  Date Provider Notified 06/20/24  Time Provider Notified 1015  Method of Notification Face-to-face  Notification Reason Other (Comment) (yellow MEWS)  Provider response In department;See new orders  Date of Provider Response 06/20/24  Time of Provider Response 1015

## 2024-06-20 NOTE — Progress Notes (Signed)
 [facility]    Date of Admission:  05/08/2024      ID: Elizabeth Odom is a 83 y.o. female with   Principal Problem:   Diverticular disease Active Problems:   A-fib (HCC)   Restless leg   ABLA (acute blood loss anemia)   Need for emotional support   Acute urinary retention   Mixed hyperlipidemia   Essential hypertension   Diverticular disease of left colon   History of DVT (deep vein thrombosis)   History of pulmonary embolus (PE)   Pre-diabetes   Presence of IVC filter   Stricture of sigmoid colon (HCC)   Protein-calorie malnutrition, severe   Hypokalemia   S/P percutaneous endoscopic gastrostomy (PEG) tube placement (HCC)   Goals of care, counseling/discussion   Shock, during or resulting from a surgical procedure    Subjective: Afebrile, underwent L- sided thoracentesis with removed, feels that it is easier to breath. Rectal tube removed. She still has LLQ pain. But overall looks more comfortable today, the best this week.  Still on tpn, gastrostomy to gravity  Medications:   acetaminophen   1,000 mg Per Tube Q6H   Chlorhexidine  Gluconate Cloth  6 each Topical Daily   gabapentin   200 mg Oral TID   liver oil-zinc  oxide   Topical BID   metoprolol  tartrate  12.5 mg Per Tube BID   nystatin    Topical BID   Opium   6 mg Per Tube QID   mouth rinse  15 mL Mouth Rinse 4 times per day   oxyCODONE   5 mg Per Tube QHS   polycarbophil  1,250 mg Per Tube BID   sodium chloride  flush  10-40 mL Intracatheter Q12H   sodium chloride  flush  3 mL Intravenous Q12H   sodium chloride  flush  5 mL Intracatheter Q8H   tamsulosin   0.4 mg Oral QPC breakfast    Objective: Vital signs in last 24 hours: Temp:  [98 F (36.7 C)-98.4 F (36.9 C)] 98.4 F (36.9 C) (07/11 0939) Pulse Rate:  [52-102] 92 (07/11 1344) Resp:  [16-20] 17 (07/11 1344) BP: (92-158)/(45-91) 116/79 (07/11 1344) SpO2:  [96 %-100 %] 97 % (07/11 1344)  Physical Exam  Constitutional:   oriented to person, place, and time. appears well-developed and well-nourished. No distress.  HENT: Elizabeth Odom/AT, PERRLA, no scleral icterus Mouth/Throat: Oropharynx is clear and moist. No oropharyngeal exudate.  Cardiovascular: Normal rate, regular rhythm and normal heart sounds. Exam reveals no gallop and no friction rub.  No murmur heard.  Pulmonary/Chest: Effort normal and breath sounds normal. No respiratory distress.  has no wheezes.  Neck = supple, no nuchal rigidity Abdominal: Soft. Bowel sounds are slow. colostomy-small amount of liquid in bag < 10mL Lymphadenopathy: no cervical adenopathy. No axillary adenopathy Neurological: alert and oriented to person, place, and time.  Skin: Skin is warm and dry. No rash noted. No erythema.  Psychiatric: a normal mood and affect.  behavior is normal.    Lab Results Recent Labs    06/19/24 0305 06/20/24 0436  WBC  --  11.1*  HGB  --  11.0*  HCT  --  35.8*  NA 129* 134*  K 4.0 4.3  CL 98 102  CO2 24 24  BUN 25* 27*  CREATININE 0.44 0.39*   Liver Panel Recent Labs    06/19/24 0305  PROT 7.0  ALBUMIN  1.9*  AST 32  ALT 29  ALKPHOS 255*  BILITOT 1.9*   Sedimentation Rate No results for  input(s): ESRSEDRATE in the last 72 hours. C-Reactive Protein No results for input(s): CRP in the last 72 hours.  Microbiology: Reviewed 7/10 thoracentesis - ngtd 7/7 abdominal fluid =ngtd 7/2 ant abd fluid- PsA, enterococcus, clostridial species. and MRSA Studies/Results: US  THORACENTESIS ASP PLEURAL SPACE W/IMG GUIDE Result Date: 06/19/2024 INDICATION: 83 year old female with left pleural effusion. Request for diagnostic and therapeutic thoracentesis. EXAM: ULTRASOUND GUIDED LEFT THORACENTESIS MEDICATIONS: 5 mL 1% lidocaine  COMPLICATIONS: None immediate. PROCEDURE: An ultrasound guided thoracentesis was thoroughly discussed with the patient and questions answered. The benefits, risks, alternatives and complications were also discussed. The  patient understands and wishes to proceed with the procedure. Written consent was obtained. Ultrasound was performed to localize and mark an adequate pocket of fluid in the left chest. The area was then prepped and draped in the normal sterile fashion. 1% Lidocaine  was used for local anesthesia. Under ultrasound guidance a 6 Fr Safe-T-Centesis catheter was introduced. Thoracentesis was performed. The catheter was removed and a dressing applied. FINDINGS: A total of approximately 670 mL of amber fluid was removed. Samples were sent to the laboratory as requested by the clinical team. IMPRESSION: Successful ultrasound guided left thoracentesis yielding 670 mL of pleural fluid. Performed by: Elizabeth Matthews PA-C Electronically Signed   By: Elizabeth Odom M.D.   On: 06/19/2024 16:18   DG CHEST PORT 1 VIEW Result Date: 06/19/2024 CLINICAL DATA:  Post thoracentesis on the left. EXAM: PORTABLE CHEST 1 VIEW COMPARISON:  Radiograph 06/18/2024 and 06/03/2024.  CT 09/30/2023. FINDINGS: 1541 hours. Right arm PICC projects to the lower SVC level. The heart size and mediastinal contours are stable. Left pleural effusion has decreased in volume. There are small residual bilateral pleural effusions with associated bibasilar pulmonary opacities. No pneumothorax. The bones appear unchanged. IMPRESSION: 1. Decreased left pleural effusion post thoracentesis. No pneumothorax. 2. Small residual bilateral pleural effusions with associated bibasilar pulmonary opacities. Electronically Signed   By: Elizabeth Odom M.D.   On: 06/19/2024 16:07     Assessment/Plan: 82yo F with slow anastomosis leak, s/p diverting loop transverse colostomy, gastrostomy tube,on 6/24. C/b intra abdominal hematoma and abscess- Continue on meropenem  plus daptomycin . Would do 2 wk from 7/2 and repeat abdominal imaging to see if any of fluid collections have improved  Concern for colonic fistula, transanal diarrhea= currently on bowel rest,  and gastrostomy to  gravity. QID tincture of opium   Severe protein calorie malnutrition = continue on tpn  Pleural effusion = cultures negative thus far. Appears to be therapeutic as patient has better ease of breathing  Dr Dennise available over the weekend for question. Dr Overton to see on monday  [facility] Pager: [phone]  06/20/2024, 1:52 PM

## 2024-06-20 NOTE — Progress Notes (Addendum)
 Progress Note   Patient: Elizabeth Odom FMW:[id number] DOB: 1941-04-14 DOA: 05/08/2024     43 DOS: the patient was seen and examined on 06/20/2024   Brief hospital course: Elizabeth Odom is a 83 y.o. female with a history of atrial fibrillation, DVT, PE, anemia, PACs, hypertension, mild cognitive impairment, large bowel obstruction. Patient initially presented for surgical management involving history of diverticulitis with planned ostomy takedown with anastomosis. During hospitalization, patient developed altered mental status secondary to polypharmacy in addition to atrial fibrillation with RVR. Cardiology consulted for recommendation and management with subsequent improvement in RVR. On 6/24, patient underwent repeat laparoscopy converted to laparotomy with diverting loops transverse colostomy performed, in addition to drainage of an abdominal abscess and gastrostomy/rectal tube placement. Patient required admission to ICU post-operatively for respiratory failure but was extubated successfully on 6/25 and subsequent transferred out of ICU.  Infectious disease was consulted  for  diverticulitis with perforation and abscess with colostomy, s/p ostomy takedown and redo.  Intra-abdominal abscess grew polymicrobial with Pseudomonas, staph and Candida.  ID advised for meropenem  and daptomycin .  Patient has persistent hypoxemia, abdomen has a drain, there is a left pleural effusion on chest x-ray.  Ultrasound-guided thoracocentesis done 7/10 with improved hypoxemia and saturating well on room air today 7/11.   Assessment and Plan:  Persistent hypoxemia/pneumonia/pleural effusion B/L - S/p left-sided thoracentesis with 670 mL fluid - She is off oxygen today - Continue to monitor oxygen to maintain saturation more than 90% - Follow-up culture report     Persistent atrial fibrillation/flutter Cardiology consulted for management.   -Medication adjusted to metoprolol  12.5 mg BID with recommendation for  Eliquis  when okay per surgery. - IV metoprolol  2.5 mg for systolic blood pressure more than 160 and heart rate more than 130 -Eliquis  held and transitioned to heparin  IV.  -Heparin  now held secondary to acute anemia with concern for hematoma.   History of diverticulitis with perforation and abscess with colostomy status post ostomy takedown and re-do Ileus Per primary Dr. Sheldon.  Infectious diseases consulted and are planning for management with multi week course of antibiotics.  Urine culture (6/12) significant for Pseudomonas, MRSA, Enterococcus faecalis, Candida albicans.  Drain culture (6/17) significant for rare Pseudomonas aeruginosa.  Ongoing antibiotics per infectious disease.  Patient now on daptomycin , micafungin , Zosyn . Patient underwent diverting colostomy on 6/24 with gastrotomy tube placement. TPN weaned off. Tube feeds started. Tube feeds now held and TPN re-ordered. Repeat CT abdomen/pelvis (7/5) significant for large fluid collections seen from prior CT imaging; IR consulted for drain, with left abdominal drain placed on 7/7 -Ongoing general surgery recommendations   Intraabdominal abscess Infectious disease consulted. Culture data is polymicrobial and evident for pseudomonas aeruginosa, staphylococcus aureus and candida albicans. Candida treated with micafungin  and course is completed. Abdominal drain fluid culture (7/2) significant for enterococcus faecalis, clostridium innocuum, MRSA. Left abdominal drain placed on 7/7 -ID recommendations: Meropenem , daptomycin  - Blood cultures 7/7 negative   Acute blood loss anemia Initially secondary to surgery, requiring multiple transfusions. Patient with recurrent significant anemia with concern for intraabdominal abscess vs hematoma. Patient then developed blood per rectum requiring multiple transfusions. Patient has received a total of 9 units of PRBC via transfusion. Hemoglobin stable. Will likely need to avoid anticoagulation completely  in the future. -Follow-up post-transfusion H&H and transfuse as needed -Ongoing general surgery recommendations     Rectal output Unclear source as patient has a diverting colostomy, however she has minimal output from colostomy. Concern for possible fistula, however  no fistula was identified on CT abdomen/pelvis (7/5). -Per general surgery   Contact dermatitis Noted on back. Very symptomatic with itching. Discussed with ID and not likely related to a drug reaction. No eosinophilia on differential. -Continue Atarax , triamcinolone , barrier cream   AKI Mild. Resolved.   Hypoalbuminemia Noted. Associated poor nutrition. Patient is on TPN.   Leukocytosis In setting of surgery and infection. Mildly elevated.   Thrombocytosis Noted. Likely reactive. Mildly elevated.   Hyponatremia Mild. Stable.   Syncope Syncopal episode occurred during hospitalization while patient was on commode.  Concern for possible vasovagal versus hypotension etiology.  Transthoracic echo obtained with evidence of preserved LVEF of 60 to 65% in addition to no aortic stenosis.   Acute urinary retention Foley inserted on 6/4. Flomax  started. Foley removed on 6/13. Flomax  discontinued.   Acute metabolic encephalopathy Delirium Resolved.   History of DVT/PE LLE swelling Patient previously did not tolerate anticoagulation, requiring the placement of a retrievable IVC filter on 10/05/2023. CT this admission confirms its placement at this time. RLE venous duplex without evidence of acute DVT, but there is evidence of chronic DVT involving the left posterior tibial veins and left peroneal veins. -Continue IVC filter and will avoid anticoagulation   Generalized weakness PT/OT: Recommended SNF   DVT prophylaxis: SCDs     Subjective: Still complains of abdominal pain  Physical Exam: Vitals:   06/19/24 2052 06/20/24 0154 06/20/24 0511 06/20/24 0939  BP: (!) 149/74 112/72 (!) 158/91 (!) 99/52  Pulse: 92 87  98 (!) 102  Resp: 16 16 18 18   Temp: 98 F (36.7 C) 98.4 F (36.9 C)  98.4 F (36.9 C)  TempSrc: Oral Oral Oral Oral  SpO2: 96% 96% 97% 100%  Weight:      Height:       Constitutional: Alert, awake, calm, comfortable HEENT: Neck supple Respiratory: clear to auscultation bilaterally, no wheezing, no crackles. Normal respiratory effort. No accessory muscle use.  Cardiovascular: Regular rate and rhythm, no murmurs / rubs / gallops. No extremity edema. 2+ pedal pulses. No carotid bruits.  Abdomen: Abdomen is distended, tender, bowel sounds are present, JP drain and colostomy is present Musculoskeletal: no clubbing / cyanosis. No joint deformity upper and lower extremities. Good ROM, no contractures. Normal muscle tone.  Skin: no rashes, lesions, ulcers. No induration Neurologic: CN 2-12 grossly intact. Sensation intact, DTR normal. Strength 5/5 x all 4 extremities.  Psychiatric: Normal judgment and insight. Alert and oriented x 3. Normal mood.   Data Reviewed:  Sodium 134, BUN 27, creatinine 0.39, white count 11.1, hemoglobin 11 hematocrit 35.8  Family Communication: No family was available  Disposition: Status is: Inpatient Remains inpatient appropriate because: Ongoing recovery from bowel surgery  Planned Discharge Destination: Skilled nursing facility    Time spent: 35 minutes  Author: Nena Rebel, MD 06/20/2024 10:35 AM  For on call review [email or web address].

## 2024-06-20 NOTE — Progress Notes (Signed)
 Occupational Therapy Treatment Patient Details Name: Elizabeth Odom MRN: [id number] DOB: 1941-07-28 Today's Date: 06/20/2024   History of present illness Pt is 83 yr old female who presented on 05/08/24 for elective colostomy takedown. Course complicated by A-fib with RVR and then hypotension/rectal bleeding and syncope on 05/11/2024.  Patient  had postop ileus needing NG tube and TPN.  Imaging on 05/19/2024 showed possible abscesses with transgluteal drain on 6/10 and CT guided drain to abdomen and pelvic abscesses yielding pus and stool.  Back to OR 6/24 for LOA, colostomy placement, drainage of abscess, and placement of g-tube with hypotension in OR requiring vasopressors, able to extubate 6/25. 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   OT comments  The pt continues to report having significant abdominal pain, with associated guarding of movements and decreased activity tolerance. During the session, she required mod assist for grooming, specifically for applying lotion to her BLE, as well as set-up assist for self-feeding. She was unable to progress to EOB or out of bed, due to pain. She was subsequently instructed on BUE and BLE range of motion and therapeutic exercises, for which she required intermittent active assist for BLE and cues for correct technique/form. She is making slow overall functional progress. OT to follow the pt at 1x per week frequency. OT goals updated and modified as needed this date. Patient will benefit from continued inpatient follow up therapy, <3 hours/day.       If plan is discharge home, recommend the following:  Two people to help with walking and/or transfers;A lot of help with bathing/dressing/bathroom;Assistance with cooking/housework;Direct  supervision/assist for medications management;Direct supervision/assist for financial management;Assist for transportation;Help with stairs or ramp for entrance   Equipment Recommendations  Other (comment) (to be determined pending progress at next level of care)    Recommendations for Other Services      Precautions / Restrictions Precautions Precautions: Fall Restrictions Weight Bearing Restrictions Per Provider Order: No Other Position/Activity Restrictions: abdominal surgery and precautions       Mobility Bed Mobility        General bed mobility comments: deferred, due to pt reporting significant abdominal pain    Transfers        General transfer comment: deferred, due to pt reporting significant abdominal pain         ADL either performed or assessed with clinical judgement   ADL Overall ADL's : Needs assistance/impaired Eating/Feeding: Set up;Bed level Eating/Feeding Details (indicate cue type and reason): She was noted to drink from a cup on a couple instances in bed. Grooming: Moderate assistance;Bed level Grooming Details (indicate cue type and reason): The pt reported having significant itching of her BLE. She was assisted with applying and rubbing lotion on her proximal BLE.             Lower Body Dressing: Total assistance;Bed level Lower Body Dressing Details (indicate cue type and reason): to donn socks in bed                     Communication Communication Communication: No apparent difficulties   Cognition Arousal: Alert Behavior During Therapy: Flat affect Cognition: Difficult to assess         Attention impairment (select first level of impairment): Divided attention  Following commands impaired: Follows one step commands with increased time                    Pertinent Vitals/ Pain       Pain Assessment Pain Assessment: 0-10 Pain Score: 8  Pain Location: abdomen Pain Intervention(s): Limited  activity within patient's tolerance, Monitored during session   Frequency  Min 1X/week        Progress Toward Goals  OT Goals(current goals can now be found in the care plan section)     Acute Rehab OT Goals OT Goal Formulation: With patient Time For Goal Achievement: 07/04/24 Potential to Achieve Goals: Fair ADL Goals Pt Will Perform Upper Body Dressing: with min assist;sitting Pt Will Perform Lower Body Dressing: with mod assist;sitting/lateral leans;with adaptive equipment Pt Will Transfer to Toilet: with mod assist;stand pivot transfer;bedside commode Additional ADL Goal #1: The pt will complete bed mobility with min assist, in prep for progressive ADL participation.  Plan         AM-PAC OT 6 Clicks Daily Activity     Outcome Measure   Help from another person eating meals?: A Little Help from another person taking care of personal grooming?: A Little Help from another person toileting, which includes using toliet, bedpan, or urinal?: Total Help from another person bathing (including washing, rinsing, drying)?: A Lot Help from another person to put on and taking off regular upper body clothing?: A Lot Help from another person to put on and taking off regular lower body clothing?: Total 6 Click Score: 12    End of Session Equipment Utilized During Treatment: Other (comment)  OT Visit Diagnosis: Pain;Muscle weakness (generalized) (M62.81);Unsteadiness on feet (R26.81);Other abnormalities of gait and mobility (R26.89)   Activity Tolerance Patient limited by pain   Patient Left in bed;with call bell/phone within reach;with bed alarm set   Nurse Communication Other (comment)        Time: 8343-8289 OT Time Calculation (min): 14 min  Charges: OT General Charges $OT Visit: 1 Visit OT Treatments $Therapeutic Activity: 8-22 mins     Elizabeth Odom, OTR/L 06/20/2024, 5:36 PM

## 2024-06-20 NOTE — Progress Notes (Signed)
 Pt has made several, very tearful comments today begging me to make it stop. When I ask her to elaborate she states she wishes she would go to sleep and not wake up. She verbalizes repeatedly to please make everything stop -- I can not take the pain. Alerted MD.

## 2024-06-20 NOTE — Plan of Care (Signed)
  Problem: Education: Goal: Verbalization of understanding of the causes of altered bowel function will improve Outcome: Progressing   Problem: Bowel/Gastric: Goal: Gastrointestinal status for postoperative course will improve Outcome: Not Progressing   Problem: Nutritional: Goal: Will attain and maintain optimal nutritional status will improve Outcome: Not Progressing

## 2024-06-20 NOTE — Progress Notes (Signed)
 PHARMACY - TOTAL PARENTERAL NUTRITION CONSULT NOTE   Indication: Prolonged ileus  Patient Measurements: Height: 5' 5 (165.1 cm) Weight:  (Unable to weight pt due to zeroed bed) IBW/kg (Calculated) : 57 TPN AdjBW (KG): 61.3 Body mass index is 25.53 kg/m.  Assessment: 78 yoF with history of diverticulitis with perforation and abscess. 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 She requested ostomy takedown and underwent LOA, colostomy takedown, small bowel resection with anastomosis on 5/29. Pharmacy is consulted to dose TPN starting 6/2 for postop ileus.  Post-op course complicated by anastomotic leak, multiple-intraabdominal abscesses. Post-op bleeding. Patient is now s/p diverting loop transverse colostomy.  Glucose / Insulin : no Hx DM, A1c 5.1%; CBG goal 100-150) - CBGs q6h remain < 150 - Off SSI since 6/28 Electrolytes:  Per Dr. Sheldon, keep K>4, Mg>2, Phos>3.  - Na improved 129>>134 from yesterday, all other lytes stable WNL - All electrolyte goals for ileus met as per the above Renal: SCr stable WNL (<1.0); BUN now slightly elevated - UOP remains adequate, Lasix  20mg /day expired 7/10 Hepatic: AST/ALT stable WNL; Alk Phos elevated but peaked shortly after 7/7 and now trending down - Albumin  remains significantly low - Tbili now increased to 1.9; previously WNL - TG WNL at start of TPN, became elevated on 6/25; measured peak at 205 on 6/30; currently still elevated but trending down I/O: - Colostomy: no output yesterday (possibly being diverted as below) - Gastrostomy (vent): OP had improved since 7/4 weekend; now increasing again to 550 ml yesterday - J-P Drain: OP significantly improved; now < 100 ml/d - no mIVF - Stool: rectal output (CCS suspects d/t leakage into abdomen at ileal anastomosis, with resultant creation of ileo-rectal and possible ileovesical  fistula) now improving with antimotility agents - 1x occurrence in last 24hrs  - 7/10 Thoracentesis: -670 mL  GI Imaging:  - 6/9 CTa/p: Dehiscence of rectal anastomosis w/ associated air & fluid collections - 6/16 CTa/p: minimal change from 6/9 - 6/23 CTa/p: possibly occluded JP drain, rectal anastomosis with dehiscence cavity communicating to small bowel; several fistulas of sigmoid colon with associated gas and multiple small loculated fluid collection at new percutaneous drainage catheter site. Additional small loculated fluid collection small bowel mesentery.  - 7/1 CTa/p: Multiple complex fluid collections within the upper abdomen  - 7/5 CTa/p: No well-defined or clear enterorectal fistula. Persistent fluid collections again noted in the abdomen and pelvis, possibly from prior hemorrhage, unchanged from prior. Enlarging anterior pelvic fluid collection at prior drain site  GI Surgeries / Procedures: - 5/29: LAR, SBR, colostomy takedown, LOA - 6/10 drains placed x 2 in IR (midline abd, transgluteal) - 6/17: Replacement of drainage catheter by IR - 6/24: diverting loop transverse colostomy, I&D of abdominal abscess, gastrotomy tube placement, rectal tube placement.  - 7/3: CT guided visceral fluid drain by perc cath - 7/7: CT drainage of the left anterior abdominal fluid collection  Central access: Double lumen PICC placed 6/2 TPN start date: 6/2>7/1, resume 7/4>>  Nutritional Goals: Goal TPN rate is 70 mL/hr (provides 94 g of protein and 1680 kcals per day)  RD Assessment:  Estimated Needs Total Energy Estimated Needs: 1600-1750 kcals Total Protein Estimated Needs: 80-95 grams Total Fluid Estimated Needs: >/= 1.6L  Current Nutrition:  7/9 FLD, TPN at goal rate  Multiple calorie counts this admission. Not meeting caloric needs. Refusing all supplements. Pt underwent G-tube placement 6/24. Tube feeds started 6/26,  stopped for abdominal distention and discomfort. Restarted 6/30 >>  stopped 7/3.  Plan:  At 1800, continue TPN with lipids at 70 ml/hr (goal rate) Electrolytes in TPN: maximize Na; increase Cl:Ac Na 150 mEq/L (slight decrease given Na 129 >> 134 from yesterday and current bag still infusing) K 65 mEq/L  Ca 5 mEq/L Mg 10 mEq/L Phos 25 mmol/L Cl:Ac 2:1 Continue MVI & trace in TPN Chromium on hold d/t critical shortage Stopped CBG checks with excellent control; trend CBG on routine Bmet MIVF per MD (none currently) Monitor TPN labs on Mon/Thurs, and as needed CMP tomorrow (for Tbili) Mg & Phos daily per CCS    Thank you for allowing pharmacy to be a part of this patient's care.  Lacinda Moats, PharmD Clinical Pharmacist  7/11/20257:47 AM

## 2024-06-21 DIAGNOSIS — K5790 Diverticulosis of intestine, part unspecified, without perforation or abscess without bleeding: Secondary | ICD-10-CM | POA: Diagnosis not present

## 2024-06-21 LAB — COMPREHENSIVE METABOLIC PANEL WITH GFR
ALT: 29 U/L (ref 0–44)
AST: 36 U/L (ref 15–41)
Albumin: 1.8 g/dL — ABNORMAL LOW (ref 3.5–5.0)
Alkaline Phosphatase: 294 U/L — ABNORMAL HIGH (ref 38–126)
Anion gap: 4 — ABNORMAL LOW (ref 5–15)
BUN: 29 mg/dL — ABNORMAL HIGH (ref 8–23)
CO2: 25 mmol/L (ref 22–32)
Calcium: 8.1 mg/dL — ABNORMAL LOW (ref 8.9–10.3)
Chloride: 105 mmol/L (ref 98–111)
Creatinine, Ser: 0.41 mg/dL — ABNORMAL LOW (ref 0.44–1.00)
GFR, Estimated: >60 mL/min (ref >60)
Glucose, Bld: 123 mg/dL — ABNORMAL HIGH (ref 70–99)
Potassium: 3.9 mmol/L (ref 3.5–5.1)
Sodium: 134 mmol/L — ABNORMAL LOW (ref 135–145)
Total Bilirubin: 1.5 mg/dL — ABNORMAL HIGH (ref 0.0–1.2)
Total Protein: 6.9 g/dL (ref 6.5–8.1)

## 2024-06-21 LAB — CBC
HCT: 33 % — ABNORMAL LOW (ref 36.0–46.0)
Hemoglobin: 10 g/dL — ABNORMAL LOW (ref 12.0–15.0)
MCH: 28.2 pg (ref 26.0–34.0)
MCHC: 30.3 g/dL (ref 30.0–36.0)
MCV: 93 fL (ref 80.0–100.0)
Platelets: 494 K/uL — ABNORMAL HIGH (ref 150–400)
RBC: 3.55 MIL/uL — ABNORMAL LOW (ref 3.87–5.11)
RDW: 17.9 % — ABNORMAL HIGH (ref 11.5–15.5)
WBC: 10.5 K/uL (ref 4.0–10.5)
nRBC: 0 % (ref 0.0–0.2)

## 2024-06-21 LAB — AEROBIC/ANAEROBIC CULTURE W GRAM STAIN (SURGICAL/DEEP WOUND)
Culture: NO GROWTH
Gram Stain: NONE SEEN

## 2024-06-21 LAB — PHOSPHORUS: Phosphorus: 2.7 mg/dL (ref 2.5–4.6)

## 2024-06-21 LAB — MAGNESIUM: Magnesium: 1.9 mg/dL (ref 1.7–2.4)

## 2024-06-21 MED ORDER — POTASSIUM PHOSPHATES 15 MMOLE/5ML IV SOLN
15.0000 mmol | Freq: Once | INTRAVENOUS | Status: AC
  Administered 2024-06-21: 15 mmol via INTRAVENOUS
  Filled 2024-06-21: qty 5

## 2024-06-21 MED ORDER — TRAVASOL 10 % IV SOLN
INTRAVENOUS | Status: AC
  Filled 2024-06-21: qty 940.8

## 2024-06-21 NOTE — Progress Notes (Addendum)
 PHARMACY - TOTAL PARENTERAL NUTRITION CONSULT NOTE   Indication: Prolonged ileus  Patient Measurements: Height: 5' 5 (165.1 cm) Weight:  (Unable to weight pt due to zeroed bed) IBW/kg (Calculated) : 57 TPN AdjBW (KG): 61.3 Body mass index is 25.53 kg/m.  Assessment: Elizabeth Odom with history of diverticulitis with perforation and abscess. 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 She requested ostomy takedown and underwent LOA, colostomy takedown, small bowel resection with anastomosis on 5/29. Pharmacy is consulted to dose TPN starting 6/2 for postop ileus.  Post-op course complicated by anastomotic leak, multiple-intraabdominal abscesses. Post-op bleeding. Patient is now s/p diverting loop transverse colostomy.  Glucose / Insulin : no Hx DM, A1c 5.1%; CBG goal 100-150) - CBGs q6h remain < 150 - Off SSI since 6/28 Electrolytes:  Per Dr. Sheldon, keep K>4, Mg>2, Phos>3.  - Na 134 (slightly low), K 3.9 (slightly less than goal), phos 2.7 (less than goal), Mg 1.9 (slightly less than goal) all other lytes stable WNL - All electrolyte goals for ileus met as per the above Renal: SCr stable WNL (<1.0); BUN slightly elevated Hepatic: AST/ALT stable WNL, Alk Phos elevated to 294, albumin  remains low at 1.8, Tbili improved to 1.5 - TG WNL at start of TPN, became elevated on 6/25; measured peak at 205 on 6/30; currently still elevated but trending down I/O: - Gastrostomy (vent): -350 mL/24 hrs, improved - J-P Drain: OP significantly improved; now < 100 mL/d - no mIVF - UOP: -2 L/24 hrs - Stool (colostomy): rectal output (CCS suspects d/t leakage into abdomen at ileal anastomosis, with resultant creation of ileo-rectal and possible ileovesical fistula) now improving with antimotility agents: -100 mL/24 hrs - PO intake: +970 mL/24 hrs  GI Imaging:  - 6/9 CTa/p: Dehiscence of rectal  anastomosis w/ associated air & fluid collections - 6/16 CTa/p: minimal change from 6/9 - 6/23 CTa/p: possibly occluded JP drain, rectal anastomosis with dehiscence cavity communicating to small bowel; several fistulas of sigmoid colon with associated gas and multiple small loculated fluid collection at new percutaneous drainage catheter site. Additional small loculated fluid collection small bowel mesentery.  - 7/1 CTa/p: Multiple complex fluid collections within the upper abdomen  - 7/5 CTa/p: No well-defined or clear enterorectal fistula. Persistent fluid collections again noted in the abdomen and pelvis, possibly from prior hemorrhage, unchanged from prior. Enlarging anterior pelvic fluid collection at prior drain site  GI Surgeries / Procedures: - 5/29: LAR, SBR, colostomy takedown, LOA - 6/10 drains placed x 2 in IR (midline abd, transgluteal) - 6/17: Replacement of drainage catheter by IR - 6/24: diverting loop transverse colostomy, I&D of abdominal abscess, gastrotomy tube placement, rectal tube placement.  - 7/3: CT guided visceral fluid drain by perc cath - 7/7: CT drainage of the left anterior abdominal fluid collection  Central access: Double lumen PICC placed 6/2 TPN start date: 6/2>7/1, resume 7/4>>  Nutritional Goals: Goal TPN rate is 70 mL/hr (provides 94 g of protein and 1680 kcals per day)  RD Assessment:  Estimated Needs Total Energy Estimated Needs: 1600-1750 kcals Total Protein Estimated Needs: 80-95 grams Total Fluid Estimated Needs: >/= 1.6L  Current Nutrition:  7/9 FLD, TPN at goal rate  Multiple calorie counts this admission. Not meeting caloric needs. Refusing all supplements. Pt underwent G-tube placement 6/24. Tube feeds started 6/26, stopped for abdominal distention and discomfort. Restarted 6/30 >> stopped 7/3.  Plan:  Now: IV Kphos 15mmol x1  At 1800, continue TPN with lipids at 70 ml/hr (goal rate) Electrolytes in TPN: maximize Na; increase Cl:Ac Na  154 mEq/L - increased K 65 mEq/L  Ca 5 mEq/L Mg 10 mEq/L - will not supplement outside of bag for now as Mg has been stable since 7/7 Phos 25 mmol/L Cl:Ac 2:1 Continue MVI & trace in TPN Chromium on hold d/t critical shortage Stopped CBG checks with excellent control; trend CBG on routine Bmet MIVF per MD (none currently) Monitor TPN labs on Mon/Thurs, and as needed CMP tomorrow (for Tbili) Mg & Phos daily per CCS    Thank you for allowing pharmacy to be a part of this patient's care.  Lacinda Moats, PharmD Clinical Pharmacist  7/12/20259:36 AM

## 2024-06-21 NOTE — Progress Notes (Signed)
  Daily Progress Note   Patient Name: Elizabeth Odom       Date: 06/21/2024 DOB: 05/03/1941  Age: 83 y.o. MRN#: [id number] Attending Physician: [name], MD Primary Care Physician: Jesus Bernardino MATSU, MD Admit Date: 05/08/2024 Length of Stay: 44 days  Reason for Consultation/Follow-up: Establishing goals of care  Subjective:   Reviewed recent EMR documentation as well as surgery documentation  Patient resting in bed, appears chronically ill, at times, she is also exhibiting non-verbal gestures of distress/discomfort.  Vital Signs:  BP 130/68 (BP Location: Left Arm)   Pulse 80   Temp (!) 97.3 F (36.3 C)   Resp 16   Ht 5' 5 (1.651 m)   Wt 69.6 kg   SpO2 96%   BMI 25.53 kg/m   Physical Exam: General:resting comfortably cachectic, frail Cardiovascular: RRR Respiratory: no increased work of breathing noted, not in respiratory distress    Assessment & Plan:   Assessment: Patient is an 83 year old female with a past medical history of DVT, PE, atrial fibrillation, hypertension, mild cognitive impairment, and large bowel obstruction status post colonic resection and colostomy creation in 2020 for which has been complicated by anastomotic leak.  Patient presented last month for colostomy resection and takedown but had complicated postop course with dense adhesions, A-fib with RVR, intra-abdominal abscess, and poor oral intake requiring support of TPN and PEG placement.  Palliative medicine team consulted to assist with complex medical decision making.  Recommendations/Plan: # Complex medical decision making/goals of care:  - Surgery note reviewed, palliative team on standby, available to be of further support this week, depending on hospital course. Grand daughter [facility] familiar with various members of the palliative team, including this Clinical research associate.  Note palliative medicine team will continue to engage in conversations as able and appropriate. Anticipate further discussions with family  this upcoming week.   -  Code Status: Full Code    Will revisit full code versus DNR DNI discussion this upcoming week with grand daughter HCPOA.  # Symptom management:  Medication history noted.  Also on Atarax  PRN for itching.  Ongoing general surgery recommendations  # Psychosocial support  - Already consulted chaplain to assist with spiritual support. Appreciate spiritual care.   # Discharge Planning: To Be Determined However, hospital course has been one of very slow/ non meaningful recovery thus far, palliative team remains available for code status and broad goals of care discussions next week and is available to assist family if the decision is made to consider a more comfort-focused palliative mode of care.  Thank you for allowing the palliative care team to participate in the care Elizabeth Odom.  Low MDM Lonia Serve MD.  Palliative Care Provider PMT # [phone]  If patient remains symptomatic despite maximum doses, please call PMT at [phone] between 0700 and 1900. Outside of these hours, please call attending, as PMT does not have night coverage.

## 2024-06-21 NOTE — Progress Notes (Signed)
 PROGRESS NOTE  Elizabeth Odom FMW:[id number] DOB: April 02, 1941 DOA: 05/08/2024 PCP: Jesus Bernardino MATSU, MD   LOS: 44 days   Brief narrative:  Elizabeth Odom is a 83 y.o. female with a history of atrial fibrillation, dependent thrombosis and pulmonary embolism, anemia, PACs, hypertension, mild cognitive impairment, large bowel obstruction presented to hospital initially for surgical management of diverticulitis with planned ostomy takedown with anastomosis. During hospitalization, patient developed altered mental status secondary to polypharmacy in addition to atrial fibrillation with RVR. Cardiology was consulted.  On 6/24, patient underwent repeat laparoscopy converted to laparotomy with diverting loops transverse colostomy performed, in addition to drainage of an abdominal abscess and gastrostomy/rectal tube placement. Patient required admission to ICU post-operatively for respiratory failure but was extubated successfully on 6/25 and subsequent transferred out of ICU.   Infectious disease was consulted  for  diverticulitis with perforation and abscess with colostomy, s/p ostomy takedown and redo.  Intra-abdominal abscess grew polymicrobial with Pseudomonas, staph and Candida.  ID advised for meropenem  and daptomycin .  Patient  had persistent hypoxemia, abdomen has a drain, there is a left pleural effusion on chest x-ray.  Ultrasound-guided thoracocentesis done 7/10 with improved hypoxemia and saturating well on room air since 7/11.   Assessment/Plan: Principal Problem:   Diverticular disease Active Problems:   A-fib (HCC)   Restless leg   ABLA (acute blood loss anemia)   Need for emotional support   Acute urinary retention   Mixed hyperlipidemia   Essential hypertension   Diverticular disease of left colon   History of DVT (deep vein thrombosis)   History of pulmonary embolus (PE)   Pre-diabetes   Presence of IVC filter   Stricture of sigmoid colon (HCC)   Protein-calorie malnutrition,  severe   Hypokalemia   S/P percutaneous endoscopic gastrostomy (PEG) tube placement (HCC)   Goals of care, counseling/discussion   Shock, during or resulting from a surgical procedure  Assessment and Plan:   Persistent hypoxemia/pneumonia/pleural effusion B/L Status post left-sided thoracocentesis with removal of 670 mL of fluids.  Currently on room air.  Pleural fluid culture negative so far.   Persistent atrial fibrillation/flutter Cardiology was consulted and dose of metoprolol  was adjusted to 12.5 mg twice daily with as needed IV metoprolol .  Was on IV heparin  but due to anemia and concern for hematoma on hold.  Ultimate plan to transition to Eliquis  ultimately.  Hemoglobin today 10.0.  Will continue to monitor.  History of diverticulitis with perforation and abscess with colostomy status post ostomy takedown and re-do [facility] General Surgery and ID on board.   Urine culture (6/12) significant for Pseudomonas, MRSA, Enterococcus faecalis, Candida albicans.  Drain culture (6/17) significant for rare Pseudomonas aeruginosa.  Ongoing antibiotics per infectious disease.  Patient now on daptomycin , micafungin , Zosyn . Patient underwent diverting colostomy on 6/24 with gastrotomy tube placement.  Was initially on TPN which was weaned off and was started on tube feeds but at this time tube feeds have been on hold again and TPN has been restarted. Repeat CT abdomen/pelvis (7/5) significant for large fluid collections seen from prior CT imaging; IR consulted for drain, status post left abdominal drain placed on 7/7 Follow general surgery recommendations.  On full liquids at this time.  Intraabdominal abscess Infectious disease consulted. Culture data is polymicrobial and evident for pseudomonas aeruginosa, staphylococcus aureus and candida albicans. Candida treated with micafungin  and course is completed. Abdominal drain fluid culture (7/2) significant for enterococcus faecalis, clostridium innocuum,  MRSA. Left abdominal drain placed on  7/7 -ID recommendations: Meropenem , daptomycin  - Blood cultures 7/7 negative   Acute blood loss anemia Initially secondary to surgery, requiring multiple transfusions. Patient with recurrent significant anemia with concern for intraabdominal abscess vs hematoma. Patient then developed blood per rectum requiring multiple transfusions. Patient has received a total of 9 units of PRBC via transfusion.  Heparin  drip on hold at this time.  Contact dermatitis Noted on back. Very symptomatic with itching. Discussed with ID and not likely related to a drug reaction. No eosinophilia on differential. Continue Atarax , triamcinolone , barrier cream   AKI Resolved.  Latest creatinine of 0.4.   Hypoalbuminemia Noted. Associated poor nutrition. Patient is on TPN.   Leukocytosis Improved.   Thrombocytosis Mild likely reactive.   Hyponatremia Mild.  Latest sodium of 134.   Syncope Syncopal episode occurred during hospitalization while patient was on commode.  Concern for possible vasovagal versus hypotension etiology.  Transthoracic echo obtained with evidence of preserved LVEF of 60 to 65% in addition to no aortic stenosis.  Will continue to monitor.   Acute urinary retention External urinary catheter in place.    Acute metabolic encephalopathy Delirium Currently at baseline.   History of DVT/PE LLE swelling Patient previously did not tolerate anticoagulation, requiring the placement of a retrievable IVC filter on 10/05/2023. -Continue IVC filter and will avoid anticoagulation   Generalized weakness PT has recommended skilled nursing facility placement.   DVT prophylaxis: Place and maintain sequential compression device Start: 06/12/24 1619 SCD's Start: 05/08/24 1759   Disposition: PT has recommended skilled nursing facility.   Status is: Inpatient Remains inpatient appropriate because:  pending clinical improvement.    Code Status:     Code  Status: Full Code  Family Communication: none at bedside.    Consultants: General surgery Pallative care Infectious disease PCCM  Procedures: 5/29 elective colostomy takedown per Dr. Sheldon  6/10 Transgluteal drain placed per IR 6/17 CT guided drain placed into the abdominal and pelvic abscesses yielding pus and stool.   6/24 Lysis of adhesions, colostomy placement, drainage of abscess, and placement of G-tube,  6/25 extubated.  Anti-infectives:  Merrem , daptomycin   Anti-infectives (From admission, onward)    Start     Dose/Rate Route Frequency Ordered Stop   06/15/24 1600  meropenem  (MERREM ) 1 g in sodium chloride  0.9 % 100 mL IVPB        1 g 200 mL/hr over 30 Minutes Intravenous Every 8 hours 06/15/24 1510     06/15/24 1600  DAPTOmycin  (CUBICIN ) IVPB 700 mg/100mL premix        700 mg 200 mL/hr over 30 Minutes Intravenous Daily 06/15/24 1510     06/11/24 1545  piperacillin -tazobactam (ZOSYN ) IVPB 3.375 g  Status:  Discontinued        3.375 g 12.5 mL/hr over 240 Minutes Intravenous Every 8 hours 06/11/24 1457 06/15/24 1510   06/09/24 1745  DAPTOmycin  (CUBICIN ) IVPB 500 mg/22mL premix  Status:  Discontinued        500 mg 100 mL/hr over 30 Minutes Intravenous Daily 06/09/24 1647 06/13/24 1539   06/06/24 1000  micafungin  (MYCAMINE ) 100 mg in sodium chloride  0.9 % 100 mL IVPB  Status:  Discontinued        100 mg 105 mL/hr over 1 Hours Intravenous Every 24 hours 06/05/24 1349 06/12/24 1142   06/04/24 1400  piperacillin -tazobactam (ZOSYN ) IVPB 3.375 g        3.375 g 12.5 mL/hr over 240 Minutes Intravenous Every 8 hours 06/04/24 0738 06/09/24 2124   06/04/24  1400  DAPTOmycin  (CUBICIN ) IVPB 500 mg/36mL premix        8 mg/kg  60.9 kg 100 mL/hr over 30 Minutes Intravenous Daily 06/04/24 0738 06/10/24 0126   06/03/24 1000  neomycin  (MYCIFRADIN ) tablet 500 mg  Status:  Discontinued        500 mg Oral 3 times daily 06/03/24 0935 06/03/24 1555   06/03/24 1000  metroNIDAZOLE  (FLAGYL )  tablet 500 mg  Status:  Discontinued       Note to Pharmacy: Take 2 pills (=1000mg ) by mouth at 1pm, 3pm, and 10pm the day before your colorectal operation   500 mg Oral 3 times daily 06/03/24 0935 06/03/24 1555   05/29/24 1500  DAPTOmycin  (CUBICIN ) IVPB 500 mg/50mL premix  Status:  Discontinued        8 mg/kg  60.9 kg 100 mL/hr over 30 Minutes Intravenous Daily 05/29/24 1359 06/04/24 0738   05/28/24 2100  vancomycin  (VANCOREADY) IVPB 750 mg/150 mL  Status:  Discontinued        750 mg 150 mL/hr over 60 Minutes Intravenous Every 12 hours 05/28/24 1419 05/29/24 1359   05/27/24 0800  vancomycin  (VANCOCIN ) IVPB 1000 mg/200 mL premix  Status:  Discontinued        1,000 mg 200 mL/hr over 60 Minutes Intravenous Every 24 hours 05/26/24 1144 05/28/24 1419   05/23/24 1400  piperacillin -tazobactam (ZOSYN ) IVPB 3.375 g  Status:  Discontinued        3.375 g 12.5 mL/hr over 240 Minutes Intravenous Every 8 hours 05/23/24 0804 06/04/24 0738   05/23/24 1200  micafungin  (MYCAMINE ) 100 mg in sodium chloride  0.9 % 100 mL IVPB        100 mg 105 mL/hr over 1 Hours Intravenous Every 24 hours 05/23/24 0755 06/05/24 1221   05/22/24 0800  vancomycin  (VANCOCIN ) IVPB 1000 mg/200 mL premix  Status:  Discontinued        1,000 mg 200 mL/hr over 60 Minutes Intravenous Every 24 hours 05/21/24 0634 05/26/24 0806   05/21/24 0730  vancomycin  (VANCOREADY) IVPB 1250 mg/250 mL        1,250 mg 166.7 mL/hr over 90 Minutes Intravenous  Once 05/21/24 0634 05/21/24 1043   05/19/24 1400  piperacillin -tazobactam (ZOSYN ) IVPB 3.375 g  Status:  Discontinued        3.375 g 12.5 mL/hr over 240 Minutes Intravenous Every 8 hours 05/19/24 1303 05/23/24 0804   05/09/24 0900  erythromycin  250 mg in sodium chloride  0.9 % 100 mL IVPB        250 mg 100 mL/hr over 60 Minutes Intravenous Every 8 hours 05/09/24 0732 05/11/24 0044   05/08/24 2200  cefoTEtan  (CEFOTAN ) 2 g in sodium chloride  0.9 % 100 mL IVPB        2 g 200 mL/hr over 30 Minutes  Intravenous Every 12 hours 05/08/24 1759 05/09/24 0749   05/08/24 1400  neomycin  (MYCIFRADIN ) tablet 1,000 mg  Status:  Discontinued       Placed in And Linked Group   1,000 mg Oral 3 times per day 05/08/24 1119 05/08/24 1120   05/08/24 1400  metroNIDAZOLE  (FLAGYL ) tablet 1,000 mg  Status:  Discontinued       Placed in And Linked Group   1,000 mg Oral 3 times per day 05/08/24 1119 05/08/24 1120   05/08/24 1130  cefoTEtan  (CEFOTAN ) 2 g in sodium chloride  0.9 % 100 mL IVPB        2 g 200 mL/hr over 30 Minutes Intravenous On call to O.R. 05/08/24  1119 05/09/24 0749      Subjective: Today, patient was seen and examined at bedside.  Complains of not feeling well.  Mild abdominal discomfort.  Objective: Vitals:   06/21/24 0946 06/21/24 1323  BP: (!) 140/80 130/68  Pulse: 97 80  Resp: 16 16  Temp: 98 F (36.7 C) (!) 97.3 F (36.3 C)  SpO2: 96% 96%    Intake/Output Summary (Last 24 hours) at 06/21/2024 1408 Last data filed at 06/21/2024 1100 Gross per 24 hour  Intake 1497.27 ml  Output 2662.5 ml  Net -1165.23 ml   Filed Weights   06/12/24 0351 06/13/24 0500 06/14/24 0500  Weight: 69.8 kg 69.8 kg 69.6 kg   Body mass index is 25.53 kg/m.   Physical Exam: GENERAL: Patient is alert awake and oriented. Not in obvious distress.  Appears chronically ill, on nasal cannula oxygen, appears frail and deconditioned. HENT: No scleral pallor or icterus. Pupils equally reactive to light. Oral mucosa is moist NECK: is supple, no gross swelling noted. CHEST:  Diminished breath sounds bilaterally. CVS: S1 and S2 heard, no murmur. Regular rate and rhythm.  ABDOMEN: Soft, non-tender, bowel sounds are present.  Gastrostomy tube in place, abdominal drain in place.  Urethral catheter, colostomy. EXTREMITIES: No edema.  Right upper extremity PICC line in place CNS: Cranial nerves are intact.  Generalized weakness noted SKIN: warm and dry, abdominal incision with dressing  Data Review: I have  personally reviewed the following laboratory data and studies,  CBC: Recent Labs  Lab 06/15/24 0414 06/16/24 0335 06/17/24 0445 06/20/24 0436 06/21/24 0343  WBC 8.3 10.8* 9.5 11.1* 10.5  NEUTROABS  --  7.2  --   --   --   HGB 9.9* 10.5* 10.1* 11.0* 10.0*  HCT 32.1* 33.8* 32.8* 35.8* 33.0*  MCV 91.7 92.1 91.6 91.6 93.0  PLT 393 413* 426* 489* 494*   Basic Metabolic Panel: Recent Labs  Lab 06/17/24 0445 06/18/24 0333 06/19/24 0305 06/20/24 0436 06/21/24 0343  NA 134* 132* 129* 134* 134*  K 4.0 4.0 4.0 4.3 3.9  CL 103 100 98 102 105  CO2 25 26 24 24 25   GLUCOSE 126* 127* 118* 117* 123*  BUN 18 23 25* 27* 29*  CREATININE 0.36* 0.38* 0.44 0.39* 0.41*  CALCIUM  8.2* 8.0* 7.9* 8.3* 8.1*  MG 2.0 2.0 2.0 2.1 1.9  PHOS 2.8 3.2 3.0 3.0 2.7   Liver Function Tests: Recent Labs  Lab 06/16/24 0335 06/19/24 0305 06/21/24 0343  AST 23 32 36  ALT 17 29 29   ALKPHOS 299* 255* 294*  BILITOT 1.1 1.9* 1.5*  PROT 7.0 7.0 6.9  ALBUMIN  1.9* 1.9* 1.8*   No results for input(s): LIPASE, AMYLASE in the last 168 hours. No results for input(s): AMMONIA in the last 168 hours. Cardiac Enzymes: Recent Labs  Lab 06/16/24 0335  CKTOTAL 29*   BNP (last 3 results) No results for input(s): BNP in the last 8760 hours.  ProBNP (last 3 results) No results for input(s): PROBNP in the last 8760 hours.  CBG: Recent Labs  Lab 06/18/24 0615 06/18/24 1150 06/18/24 1743 06/19/24 0001 06/19/24 0718  GLUCAP 134* 158* 131* 136* 144*   Recent Results (from the past 240 hours)  Aerobic/Anaerobic Culture w Gram Stain (surgical/deep wound)     Status: None   Collection Time: 06/11/24  4:12 PM   Specimen: Abdomen; Abdominal Fluid  Result Value Ref Range Status   Specimen Description   Final    ABDOMEN Performed at [facility], [address] [place], [place] [address]    Special Requests   Final    ABDOMINAL FLUID Performed at [facility],  [address], [place], [place] [address]    Gram Stain   Final    ABUNDANT WBC PRESENT, PREDOMINANTLY PMN ABUNDANT GRAM NEGATIVE RODS FEW GRAM POSITIVE COCCI    Culture   Final    MODERATE PSEUDOMONAS AERUGINOSA Two isolates with different morphologies were identified as the same organism.The most resistant organism was reported. FEW METHICILLIN RESISTANT STAPHYLOCOCCUS AUREUS MODERATE ENTEROCOCCUS FAECALIS MODERATE CLOSTRIDIUM INNOCUUM Standardized susceptibility testing for this organism is not available. Performed at [facility], [address], [place], [place] [address]    Report Status 06/16/2024 FINAL  Final   Organism ID, Bacteria PSEUDOMONAS AERUGINOSA  Final   Organism ID, Bacteria ENTEROCOCCUS FAECALIS  Final   Organism ID, Bacteria METHICILLIN RESISTANT STAPHYLOCOCCUS AUREUS  Final      Susceptibility   Enterococcus faecalis - MIC*    AMPICILLIN <=2 SENSITIVE Sensitive     VANCOMYCIN  1 SENSITIVE Sensitive     GENTAMICIN SYNERGY SENSITIVE Sensitive     * MODERATE ENTEROCOCCUS FAECALIS   Methicillin resistant staphylococcus aureus - MIC*    CIPROFLOXACIN >=8 RESISTANT Resistant     ERYTHROMYCIN  >=8 RESISTANT Resistant     GENTAMICIN <=0.5 SENSITIVE Sensitive     OXACILLIN >=4 RESISTANT Resistant     TETRACYCLINE <=1 SENSITIVE Sensitive     VANCOMYCIN  1 SENSITIVE Sensitive     TRIMETH/SULFA >=320 RESISTANT Resistant     CLINDAMYCIN <=0.25 SENSITIVE Sensitive     RIFAMPIN <=0.5 SENSITIVE Sensitive     Inducible Clindamycin NEGATIVE Sensitive     LINEZOLID 2 SENSITIVE Sensitive     * FEW METHICILLIN RESISTANT STAPHYLOCOCCUS AUREUS   Pseudomonas aeruginosa - MIC*    CEFTAZIDIME >=64 RESISTANT Resistant     CIPROFLOXACIN 0.5 SENSITIVE Sensitive     GENTAMICIN 4 SENSITIVE Sensitive     IMIPENEM 2 SENSITIVE Sensitive     * MODERATE PSEUDOMONAS AERUGINOSA  Aerobic/Anaerobic Culture w Gram Stain (surgical/deep wound)     Status: None (Preliminary result)    Collection Time: 06/16/24  1:08 PM   Specimen: Abscess  Result Value Ref Range Status   Specimen Description   Final    ABSCESS ABDOMEN Performed at [facility], [address], [place], [place] [address]    Special Requests   Final    NONE Performed at [facility], [address], [place], [place] [address]    Gram Stain NO WBC SEEN NO ORGANISMS SEEN   Final   Culture   Final    NO GROWTH 5 DAYS NO ANAEROBES ISOLATED; CULTURE IN PROGRESS FOR 5 DAYS Performed at [facility], [address] [place], [place] [address]    Report Status PENDING  Incomplete  Culture, Fungus without Smear     Status: None (Preliminary result)   Collection Time: 06/19/24  3:22 PM   Specimen: PATH Cytology Pleural fluid  Result Value Ref Range Status   Specimen Description   Final    PLEURAL CYTO Performed at [facility], [address], [place], [place] [address]    Special Requests   Final    NONE Performed at [facility], [address] [place], [place] [address]    Culture   Final    NO FUNGUS ISOLATED AFTER 2 DAYS Performed at [facility], [address] [place], [place] [address]    Report Status PENDING  Incomplete  Acid Fast Smear (AFB)     Status: None   Collection Time: 06/19/24  3:22 PM   Specimen: PATH Cytology Pleural fluid  Result Value Ref Range Status   AFB Specimen Processing Concentration  Final   Acid Fast Smear Negative  Final    Comment: (NOTE) Performed At: [facility] [address] [place], [place] [id number] Jennette Shorter MD Ey:[id number]    Source (AFB) PLEURAL  Final    Comment: Performed at [facility], [address] [place], [place] [address]  Body fluid culture w Gram Stain     Status: None (Preliminary result)   Collection Time: 06/19/24  3:22 PM   Specimen: PATH Cytology Pleural fluid  Result Value Ref Range Status   Specimen  Description   Final    PLEURAL CYTO Performed at [facility], [address], [place], [place] [address]    Special Requests   Final    NONE Performed at [facility], [address] [place], [place] [address]    Gram Stain   Final    FEW WBC PRESENT, PREDOMINANTLY PMN NO ORGANISMS SEEN    Culture   Final    NO GROWTH 2 DAYS Performed at [facility], [address] [place], [place] [address]    Report Status PENDING  Incomplete     Studies: US  THORACENTESIS ASP PLEURAL SPACE W/IMG GUIDE Result Date: 06/19/2024 INDICATION: 83 year old female with left pleural effusion. Request for diagnostic and therapeutic thoracentesis. EXAM: ULTRASOUND GUIDED LEFT THORACENTESIS MEDICATIONS: 5 mL 1% lidocaine  COMPLICATIONS: None immediate. PROCEDURE: An ultrasound guided thoracentesis was thoroughly discussed with the patient and questions answered. The benefits, risks, alternatives and complications were also discussed. The patient understands and wishes to proceed with the procedure. Written consent was obtained. Ultrasound was performed to localize and mark an adequate pocket of fluid in the left chest. The area was then prepped and draped in the normal sterile fashion. 1% Lidocaine  was used for local anesthesia. Under ultrasound guidance a 6 Fr Safe-T-Centesis catheter was introduced. Thoracentesis was performed. The catheter was removed and a dressing applied. FINDINGS: A total of approximately 670 mL of amber fluid was removed. Samples were sent to the laboratory as requested by the clinical team. IMPRESSION: Successful ultrasound guided left thoracentesis yielding 670 mL of pleural fluid. Performed by: Kacie Matthews PA-C Electronically Signed   By: Juliene Balder M.D.   On: 06/19/2024 16:18   DG CHEST PORT 1 VIEW Result Date: 06/19/2024 CLINICAL DATA:  Post thoracentesis on the left. EXAM: PORTABLE CHEST 1 VIEW COMPARISON:  Radiograph 06/18/2024 and  06/03/2024.  CT 09/30/2023. FINDINGS: 1541 hours. Right arm PICC projects to the lower SVC level. The heart size and mediastinal contours are stable. Left pleural effusion has decreased in volume. There are small residual bilateral pleural effusions with associated bibasilar pulmonary opacities. No pneumothorax. The bones appear unchanged. IMPRESSION: 1. Decreased left pleural effusion post thoracentesis. No pneumothorax. 2. Small residual bilateral pleural effusions with associated bibasilar pulmonary opacities. Electronically Signed   By: Elsie Perone M.D.   On: 06/19/2024 16:07      Vernal Alstrom, MD  [facility] 06/21/2024  If 7PM-7AM, please contact night-coverage

## 2024-06-21 NOTE — Progress Notes (Signed)
 18 Days Post-Op   Subjective/Chief Complaint: Responsive once awakened, only complains of cold feet   Objective: Vital signs in last 24 hours: Temp:  [97.6 F (36.4 C)-98.6 F (37 C)] 97.6 F (36.4 C) (07/12 0616) Pulse Rate:  [77-102] 96 (07/12 0616) Resp:  [16-18] 16 (07/12 0616) BP: (99-146)/(52-88) 105/88 (07/12 0616) SpO2:  [94 %-100 %] 95 % (07/12 0616) Last BM Date : 06/20/24  Intake/Output from previous day: 07/11 0701 - 07/12 0700 In: 3309.7 [P.O.:970; I.V.:1669.9; IV Piggyback:399.9] Out: 2512.5 [Urine:2050; Drains:362.5; Stool:100] Intake/Output this shift: No intake/output data recorded.  General ill, not very talkative Pulm effort normal Cv regular Ab mild firmness, colostomy present, midline wound unchanged G tube in place  Lab Results:  Recent Labs    06/20/24 0436 06/21/24 0343  WBC 11.1* 10.5  HGB 11.0* 10.0*  HCT 35.8* 33.0*  PLT 489* 494*   BMET Recent Labs    06/20/24 0436 06/21/24 0343  NA 134* 134*  K 4.3 3.9  CL 102 105  CO2 24 25  GLUCOSE 117* 123*  BUN 27* 29*  CREATININE 0.39* 0.41*  CALCIUM  8.3* 8.1*   PT/INR No results for input(s): LABPROT, INR in the last 72 hours. ABG No results for input(s): PHART, HCO3 in the last 72 hours.  Invalid input(s): PCO2, PO2  Studies/Results: US  THORACENTESIS ASP PLEURAL SPACE W/IMG GUIDE Result Date: 06/19/2024 INDICATION: 83 year old female with left pleural effusion. Request for diagnostic and therapeutic thoracentesis. EXAM: ULTRASOUND GUIDED LEFT THORACENTESIS MEDICATIONS: 5 mL 1% lidocaine  COMPLICATIONS: None immediate. PROCEDURE: An ultrasound guided thoracentesis was thoroughly discussed with the patient and questions answered. The benefits, risks, alternatives and complications were also discussed. The patient understands and wishes to proceed with the procedure. Written consent was obtained. Ultrasound was performed to localize and mark an adequate pocket of fluid in the  left chest. The area was then prepped and draped in the normal sterile fashion. 1% Lidocaine  was used for local anesthesia. Under ultrasound guidance a 6 Fr Safe-T-Centesis catheter was introduced. Thoracentesis was performed. The catheter was removed and a dressing applied. FINDINGS: A total of approximately 670 mL of amber fluid was removed. Samples were sent to the laboratory as requested by the clinical team. IMPRESSION: Successful ultrasound guided left thoracentesis yielding 670 mL of pleural fluid. Performed by: Kacie Matthews PA-C Electronically Signed   By: Juliene Balder M.D.   On: 06/19/2024 16:18   DG CHEST PORT 1 VIEW Result Date: 06/19/2024 CLINICAL DATA:  Post thoracentesis on the left. EXAM: PORTABLE CHEST 1 VIEW COMPARISON:  Radiograph 06/18/2024 and 06/03/2024.  CT 09/30/2023. FINDINGS: 1541 hours. Right arm PICC projects to the lower SVC level. The heart size and mediastinal contours are stable. Left pleural effusion has decreased in volume. There are small residual bilateral pleural effusions with associated bibasilar pulmonary opacities. No pneumothorax. The bones appear unchanged. IMPRESSION: 1. Decreased left pleural effusion post thoracentesis. No pneumothorax. 2. Small residual bilateral pleural effusions with associated bibasilar pulmonary opacities. Electronically Signed   By: Elsie Perone M.D.   On: 06/19/2024 16:07    Anti-infectives: Anti-infectives (From admission, onward)    Start     Dose/Rate Route Frequency Ordered Stop   06/15/24 1600  meropenem  (MERREM ) 1 g in sodium chloride  0.9 % 100 mL IVPB        1 g 200 mL/hr over 30 Minutes Intravenous Every 8 hours 06/15/24 1510     06/15/24 1600  DAPTOmycin  (CUBICIN ) IVPB 700 mg/100mL premix  700 mg 200 mL/hr over 30 Minutes Intravenous Daily 06/15/24 1510     06/11/24 1545  piperacillin -tazobactam (ZOSYN ) IVPB 3.375 g  Status:  Discontinued        3.375 g 12.5 mL/hr over 240 Minutes Intravenous Every 8 hours  06/11/24 1457 06/15/24 1510   06/09/24 1745  DAPTOmycin  (CUBICIN ) IVPB 500 mg/50mL premix  Status:  Discontinued        500 mg 100 mL/hr over 30 Minutes Intravenous Daily 06/09/24 1647 06/13/24 1539   06/06/24 1000  micafungin  (MYCAMINE ) 100 mg in sodium chloride  0.9 % 100 mL IVPB  Status:  Discontinued        100 mg 105 mL/hr over 1 Hours Intravenous Every 24 hours 06/05/24 1349 06/12/24 1142   06/04/24 1400  piperacillin -tazobactam (ZOSYN ) IVPB 3.375 g        3.375 g 12.5 mL/hr over 240 Minutes Intravenous Every 8 hours 06/04/24 0738 06/09/24 2124   06/04/24 1400  DAPTOmycin  (CUBICIN ) IVPB 500 mg/50mL premix        8 mg/kg  60.9 kg 100 mL/hr over 30 Minutes Intravenous Daily 06/04/24 0738 06/10/24 0126   06/03/24 1000  neomycin  (MYCIFRADIN ) tablet 500 mg  Status:  Discontinued        500 mg Oral 3 times daily 06/03/24 0935 06/03/24 1555   06/03/24 1000  metroNIDAZOLE  (FLAGYL ) tablet 500 mg  Status:  Discontinued       Note to Pharmacy: Take 2 pills (=1000mg ) by mouth at 1pm, 3pm, and 10pm the day before your colorectal operation   500 mg Oral 3 times daily 06/03/24 0935 06/03/24 1555   05/29/24 1500  DAPTOmycin  (CUBICIN ) IVPB 500 mg/50mL premix  Status:  Discontinued        8 mg/kg  60.9 kg 100 mL/hr over 30 Minutes Intravenous Daily 05/29/24 1359 06/04/24 0738   05/28/24 2100  vancomycin  (VANCOREADY) IVPB 750 mg/150 mL  Status:  Discontinued        750 mg 150 mL/hr over 60 Minutes Intravenous Every 12 hours 05/28/24 1419 05/29/24 1359   05/27/24 0800  vancomycin  (VANCOCIN ) IVPB 1000 mg/200 mL premix  Status:  Discontinued        1,000 mg 200 mL/hr over 60 Minutes Intravenous Every 24 hours 05/26/24 1144 05/28/24 1419   05/23/24 1400  piperacillin -tazobactam (ZOSYN ) IVPB 3.375 g  Status:  Discontinued        3.375 g 12.5 mL/hr over 240 Minutes Intravenous Every 8 hours 05/23/24 0804 06/04/24 0738   05/23/24 1200  micafungin  (MYCAMINE ) 100 mg in sodium chloride  0.9 % 100 mL IVPB         100 mg 105 mL/hr over 1 Hours Intravenous Every 24 hours 05/23/24 0755 06/05/24 1221   05/22/24 0800  vancomycin  (VANCOCIN ) IVPB 1000 mg/200 mL premix  Status:  Discontinued        1,000 mg 200 mL/hr over 60 Minutes Intravenous Every 24 hours 05/21/24 0634 05/26/24 0806   05/21/24 0730  vancomycin  (VANCOREADY) IVPB 1250 mg/250 mL        1,250 mg 166.7 mL/hr over 90 Minutes Intravenous  Once 05/21/24 0634 05/21/24 1043   05/19/24 1400  piperacillin -tazobactam (ZOSYN ) IVPB 3.375 g  Status:  Discontinued        3.375 g 12.5 mL/hr over 240 Minutes Intravenous Every 8 hours 05/19/24 1303 05/23/24 0804   05/09/24 0900  erythromycin  250 mg in sodium chloride  0.9 % 100 mL IVPB        250 mg 100 mL/hr over  60 Minutes Intravenous Every 8 hours 05/09/24 0732 05/11/24 0044   05/08/24 2200  cefoTEtan  (CEFOTAN ) 2 g in sodium chloride  0.9 % 100 mL IVPB        2 g 200 mL/hr over 30 Minutes Intravenous Every 12 hours 05/08/24 1759 05/09/24 0749   05/08/24 1400  neomycin  (MYCIFRADIN ) tablet 1,000 mg  Status:  Discontinued       Placed in And Linked Group   1,000 mg Oral 3 times per day 05/08/24 1119 05/08/24 1120   05/08/24 1400  metroNIDAZOLE  (FLAGYL ) tablet 1,000 mg  Status:  Discontinued       Placed in And Linked Group   1,000 mg Oral 3 times per day 05/08/24 1119 05/08/24 1120   05/08/24 1130  cefoTEtan  (CEFOTAN ) 2 g in sodium chloride  0.9 % 100 mL IVPB        2 g 200 mL/hr over 30 Minutes Intravenous On call to O.R. 05/08/24 1119 05/09/24 0749       Assessment/Plan: Colostomy takedown with delayed leak, new colostomy -very deconditioned -palliative care likely a good option and Dr Sheldon waiting for family next week -ID and TRH following   Elizabeth Odom 06/21/2024

## 2024-06-21 NOTE — Plan of Care (Signed)
  Problem: Education: Goal: Verbalization of understanding of the causes of altered bowel function will improve Outcome: Progressing   Problem: Bowel/Gastric: Goal: Gastrointestinal status for postoperative course will improve Outcome: Progressing   Problem: Health Behavior/Discharge Planning: Goal: Identification of community resources to assist with postoperative recovery needs will improve Outcome: Progressing   Problem: Nutritional: Goal: Will attain and maintain optimal nutritional status will improve Outcome: Progressing   Problem: Clinical Measurements: Goal: Postoperative complications will be avoided or minimized Outcome: Progressing   Problem: Respiratory: Goal: Respiratory status will improve Outcome: Progressing   Problem: Skin Integrity: Goal: Will show signs of wound healing Outcome: Progressing   Problem: Education: Goal: Knowledge of General Education information will improve Description: Including pain rating scale, medication(s)/side effects and non-pharmacologic comfort measures Outcome: Progressing   Problem: Health Behavior/Discharge Planning: Goal: Ability to manage health-related needs will improve Outcome: Progressing   Problem: Clinical Measurements: Goal: Ability to maintain clinical measurements within normal limits will improve Outcome: Progressing Goal: Will remain free from infection Outcome: Progressing Goal: Diagnostic test results will improve Outcome: Progressing Goal: Respiratory complications will improve Outcome: Progressing Goal: Cardiovascular complication will be avoided Outcome: Progressing   Problem: Activity: Goal: Risk for activity intolerance will decrease Outcome: Progressing   Problem: Coping: Goal: Level of anxiety will decrease Outcome: Progressing   Problem: Elimination: Goal: Will not experience complications related to bowel motility Outcome: Progressing Goal: Will not experience complications related to  urinary retention Outcome: Progressing   Problem: Pain Managment: Goal: General experience of comfort will improve and/or be controlled Outcome: Progressing   Problem: Safety: Goal: Ability to remain free from injury will improve Outcome: Progressing   Problem: Skin Integrity: Goal: Risk for impaired skin integrity will decrease Outcome: Progressing   Problem: Education: Goal: Ability to describe self-care measures that may prevent or decrease complications (Diabetes Survival Skills Education) will improve Outcome: Progressing Goal: Individualized Educational Video(s) Outcome: Progressing   Problem: Coping: Goal: Ability to adjust to condition or change in health will improve Outcome: Progressing   Problem: Fluid Volume: Goal: Ability to maintain a balanced intake and output will improve Outcome: Progressing   Problem: Health Behavior/Discharge Planning: Goal: Ability to identify and utilize available resources and services will improve Outcome: Progressing Goal: Ability to manage health-related needs will improve Outcome: Progressing   Problem: Metabolic: Goal: Ability to maintain appropriate glucose levels will improve Outcome: Progressing   Problem: Nutritional: Goal: Maintenance of adequate nutrition will improve Outcome: Progressing Goal: Progress toward achieving an optimal weight will improve Outcome: Progressing   Problem: Skin Integrity: Goal: Risk for impaired skin integrity will decrease Outcome: Progressing   Problem: Tissue Perfusion: Goal: Adequacy of tissue perfusion will improve Outcome: Progressing   Problem: Activity: Goal: Ability to tolerate increased activity will improve Outcome: Progressing   Problem: Respiratory: Goal: Ability to maintain a clear airway and adequate ventilation will improve Outcome: Progressing   Problem: Role Relationship: Goal: Method of communication will improve Outcome: Progressing

## 2024-06-22 DIAGNOSIS — K5790 Diverticulosis of intestine, part unspecified, without perforation or abscess without bleeding: Secondary | ICD-10-CM | POA: Diagnosis not present

## 2024-06-22 LAB — BASIC METABOLIC PANEL WITH GFR
Anion gap: 6 (ref 5–15)
BUN: 27 mg/dL — ABNORMAL HIGH (ref 8–23)
CO2: 23 mmol/L (ref 22–32)
Calcium: 8.1 mg/dL — ABNORMAL LOW (ref 8.9–10.3)
Chloride: 105 mmol/L (ref 98–111)
Creatinine, Ser: 0.41 mg/dL — ABNORMAL LOW (ref 0.44–1.00)
GFR, Estimated: >60 mL/min (ref >60)
Glucose, Bld: 109 mg/dL — ABNORMAL HIGH (ref 70–99)
Potassium: 4.2 mmol/L (ref 3.5–5.1)
Sodium: 134 mmol/L — ABNORMAL LOW (ref 135–145)

## 2024-06-22 LAB — BODY FLUID CULTURE W GRAM STAIN: Culture: NO GROWTH

## 2024-06-22 LAB — MAGNESIUM: Magnesium: 2 mg/dL (ref 1.7–2.4)

## 2024-06-22 LAB — PHOSPHORUS: Phosphorus: 3.1 mg/dL (ref 2.5–4.6)

## 2024-06-22 MED ORDER — TRAVASOL 10 % IV SOLN
INTRAVENOUS | Status: AC
  Filled 2024-06-22: qty 940.8

## 2024-06-22 NOTE — Progress Notes (Signed)
 PHARMACY - TOTAL PARENTERAL NUTRITION CONSULT NOTE   Indication: Prolonged ileus  Patient Measurements: Height: 5' 5 (165.1 cm) Weight:  (Unable to weight pt due to zeroed bed) IBW/kg (Calculated) : 57 TPN AdjBW (KG): 61.3 Body mass index is 25.53 kg/m.  Assessment: 28 yoF with history of diverticulitis with perforation and abscess. 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 She requested ostomy takedown and underwent LOA, colostomy takedown, small bowel resection with anastomosis on 5/29. Pharmacy is consulted to dose TPN starting 6/2 for postop ileus.  Post-op course complicated by anastomotic leak, multiple-intraabdominal abscesses. Post-op bleeding. Patient is now s/p diverting loop transverse colostomy.  Glucose / Insulin : no Hx DM, A1c 5.1%; CBG goal 100-150) - CBGs q6h remain < 150 - Off SSI since 6/28 Electrolytes:  Per Dr. Sheldon, keep K>4, Mg>2, Phos>3.  - Na 134 (slightly low), all other lytes stable WNL including CoCa 9.8 - All electrolyte goals for ileus met as per the above Renal: SCr stable WNL (<1.0); BUN slightly elevated Hepatic: 7/12 labs: AST/ALT stable WNL, Alk Phos elevated to 294, albumin  remains low at 1.8, Tbili improved to 1.5 - TG WNL at start of TPN, became elevated on 6/25; measured peak at 205 on 6/30; currently still elevated but trending down I/O: - Gastrostomy (vent): -100 mL/24 hrs, improved - J-P Drain: OP significantly improved; now < 100 mL/d - no mIVF - UOP: -1.2 L/24 hrs - Stool (colostomy): rectal output (CCS suspects d/t leakage into abdomen at ileal anastomosis, with resultant 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 Additional small loculated fluid collection small bowel mesentery.  - 7/1 CTa/p: Multiple complex fluid collections within the upper abdomen  - 7/5 CTa/p: No well-defined or clear enterorectal fistula. Persistent fluid collections again noted in the abdomen and pelvis, possibly from prior hemorrhage, unchanged from prior. Enlarging anterior pelvic fluid collection at prior drain site  GI Surgeries / Procedures: - 5/29: LAR, SBR, colostomy takedown, LOA - 6/10 drains placed x 2 in IR (midline abd, transgluteal) - 6/17: Replacement of drainage catheter by IR - 6/24: diverting loop transverse colostomy, I&D of abdominal abscess, gastrotomy tube placement, rectal tube placement.  - 7/3: CT guided visceral fluid drain by perc cath - 7/7: CT drainage of the left anterior abdominal fluid collection  Central access: Double lumen PICC placed 6/2 TPN start date: 6/2>7/1, resume 7/4>>  Nutritional Goals: Goal TPN rate is 70 mL/hr (provides 94 g of protein and 1680 kcals per day)  RD Assessment:  Estimated Needs Total Energy Estimated Needs: 1600-1750 kcals Total Protein Estimated Needs: 80-95 grams Total Fluid Estimated Needs: >/= 1.6L  Current Nutrition:  7/9 FLD, TPN at goal rate  Multiple calorie counts this admission. Not meeting caloric needs. Refusing all supplements. Pt underwent G-tube placement 6/24. Tube feeds started 6/26, stopped for abdominal distention and discomfort. Restarted 6/30 >> stopped 7/3.  Plan:  Now: IV Kphos 15mmol x1  At 1800, continue TPN with lipids at 70 ml/hr (goal rate) Electrolytes  in TPN:  Na 154 mEq/L K 65 mEq/L  Ca 5 mEq/L Mg 10 mEq/L Phos 25 mmol/L Cl:Ac 2:1 Continue  MVI & trace in TPN Chromium on hold d/t critical shortage Stopped CBG checks with excellent control; trend CBG on routine Bmet MIVF per MD (none currently) Monitor TPN labs on Mon/Thurs, and as needed    Thank you for allowing pharmacy to be a part of this patient's care.  Lacinda Moats, PharmD Clinical Pharmacist  7/13/20258:10 AM

## 2024-06-22 NOTE — Plan of Care (Signed)
  Problem: Education: Goal: Verbalization of understanding of the causes of altered bowel function will improve Outcome: Not Progressing   Problem: Bowel/Gastric: Goal: Gastrointestinal status for postoperative course will improve Outcome: Not Progressing   Problem: Nutritional: Goal: Will attain and maintain optimal nutritional status will improve Outcome: Not Progressing

## 2024-06-22 NOTE — Progress Notes (Signed)
 Pt screaming loudly throughout the day help me . SABRA Help me!! And then alternates with Let me go . SABRA Let me go! Attempts made to keep pt comfortable.

## 2024-06-22 NOTE — Progress Notes (Signed)
 19 Days Post-Op   Subjective/Chief Complaint: Yelling for help   Objective: Vital signs in last 24 hours: Temp:  [97.3 F (36.3 C)-98.5 F (36.9 C)] 98.5 F (36.9 C) (07/13 0213) Pulse Rate:  [80-132] 96 (07/13 0213) Resp:  [16-17] 17 (07/13 0213) BP: (130-144)/(68-92) 133/77 (07/13 0213) SpO2:  [93 %-96 %] 94 % (07/13 0213) Last BM Date : 06/21/24 (ostomy and rectum)  Intake/Output from previous day: 07/12 0701 - 07/13 0700 In: 570 [P.O.:570] Out: 1625 [Urine:1250; Drains:125; Stool:250] Intake/Output this shift: No intake/output data recorded.  General ill, not very talkative Pulm effort normal Cv regular Ab mild firmness, colostomy present, midline wound unchanged G tube in place    Lab Results:  Recent Labs    06/20/24 0436 06/21/24 0343  WBC 11.1* 10.5  HGB 11.0* 10.0*  HCT 35.8* 33.0*  PLT 489* 494*   BMET Recent Labs    06/21/24 0343 06/22/24 0327  NA 134* 134*  K 3.9 4.2  CL 105 105  CO2 25 23  GLUCOSE 123* 109*  BUN 29* 27*  CREATININE 0.41* 0.41*  CALCIUM  8.1* 8.1*   PT/INR No results for input(s): LABPROT, INR in the last 72 hours. ABG No results for input(s): PHART, HCO3 in the last 72 hours.  Invalid input(s): PCO2, PO2  Studies/Results: No results found.  Anti-infectives: Anti-infectives (From admission, onward)    Start     Dose/Rate Route Frequency Ordered Stop   06/15/24 1600  meropenem  (MERREM ) 1 g in sodium chloride  0.9 % 100 mL IVPB        1 g 200 mL/hr over 30 Minutes Intravenous Every 8 hours 06/15/24 1510     06/15/24 1600  DAPTOmycin  (CUBICIN ) IVPB 700 mg/100mL premix        700 mg 200 mL/hr over 30 Minutes Intravenous Daily 06/15/24 1510     06/11/24 1545  piperacillin -tazobactam (ZOSYN ) IVPB 3.375 g  Status:  Discontinued        3.375 g 12.5 mL/hr over 240 Minutes Intravenous Every 8 hours 06/11/24 1457 06/15/24 1510   06/09/24 1745  DAPTOmycin  (CUBICIN ) IVPB 500 mg/50mL premix  Status:  Discontinued         500 mg 100 mL/hr over 30 Minutes Intravenous Daily 06/09/24 1647 06/13/24 1539   06/06/24 1000  micafungin  (MYCAMINE ) 100 mg in sodium chloride  0.9 % 100 mL IVPB  Status:  Discontinued        100 mg 105 mL/hr over 1 Hours Intravenous Every 24 hours 06/05/24 1349 06/12/24 1142   06/04/24 1400  piperacillin -tazobactam (ZOSYN ) IVPB 3.375 g        3.375 g 12.5 mL/hr over 240 Minutes Intravenous Every 8 hours 06/04/24 0738 06/09/24 2124   06/04/24 1400  DAPTOmycin  (CUBICIN ) IVPB 500 mg/25mL premix        8 mg/kg  60.9 kg 100 mL/hr over 30 Minutes Intravenous Daily 06/04/24 0738 06/10/24 0126   06/03/24 1000  neomycin  (MYCIFRADIN ) tablet 500 mg  Status:  Discontinued        500 mg Oral 3 times daily 06/03/24 0935 06/03/24 1555   06/03/24 1000  metroNIDAZOLE  (FLAGYL ) tablet 500 mg  Status:  Discontinued       Note to Pharmacy: Take 2 pills (=1000mg ) by mouth at 1pm, 3pm, and 10pm the day before your colorectal operation   500 mg Oral 3 times daily 06/03/24 0935 06/03/24 1555   05/29/24 1500  DAPTOmycin  (CUBICIN ) IVPB 500 mg/50mL premix  Status:  Discontinued  8 mg/kg  60.9 kg 100 mL/hr over 30 Minutes Intravenous Daily 05/29/24 1359 06/04/24 0738   05/28/24 2100  vancomycin  (VANCOREADY) IVPB 750 mg/150 mL  Status:  Discontinued        750 mg 150 mL/hr over 60 Minutes Intravenous Every 12 hours 05/28/24 1419 05/29/24 1359   05/27/24 0800  vancomycin  (VANCOCIN ) IVPB 1000 mg/200 mL premix  Status:  Discontinued        1,000 mg 200 mL/hr over 60 Minutes Intravenous Every 24 hours 05/26/24 1144 05/28/24 1419   05/23/24 1400  piperacillin -tazobactam (ZOSYN ) IVPB 3.375 g  Status:  Discontinued        3.375 g 12.5 mL/hr over 240 Minutes Intravenous Every 8 hours 05/23/24 0804 06/04/24 0738   05/23/24 1200  micafungin  (MYCAMINE ) 100 mg in sodium chloride  0.9 % 100 mL IVPB        100 mg 105 mL/hr over 1 Hours Intravenous Every 24 hours 05/23/24 0755 06/05/24 1221   05/22/24 0800   vancomycin  (VANCOCIN ) IVPB 1000 mg/200 mL premix  Status:  Discontinued        1,000 mg 200 mL/hr over 60 Minutes Intravenous Every 24 hours 05/21/24 0634 05/26/24 0806   05/21/24 0730  vancomycin  (VANCOREADY) IVPB 1250 mg/250 mL        1,250 mg 166.7 mL/hr over 90 Minutes Intravenous  Once 05/21/24 0634 05/21/24 1043   05/19/24 1400  piperacillin -tazobactam (ZOSYN ) IVPB 3.375 g  Status:  Discontinued        3.375 g 12.5 mL/hr over 240 Minutes Intravenous Every 8 hours 05/19/24 1303 05/23/24 0804   05/09/24 0900  erythromycin  250 mg in sodium chloride  0.9 % 100 mL IVPB        250 mg 100 mL/hr over 60 Minutes Intravenous Every 8 hours 05/09/24 0732 05/11/24 0044   05/08/24 2200  cefoTEtan  (CEFOTAN ) 2 g in sodium chloride  0.9 % 100 mL IVPB        2 g 200 mL/hr over 30 Minutes Intravenous Every 12 hours 05/08/24 1759 05/09/24 0749   05/08/24 1400  neomycin  (MYCIFRADIN ) tablet 1,000 mg  Status:  Discontinued       Placed in And Linked Group   1,000 mg Oral 3 times per day 05/08/24 1119 05/08/24 1120   05/08/24 1400  metroNIDAZOLE  (FLAGYL ) tablet 1,000 mg  Status:  Discontinued       Placed in And Linked Group   1,000 mg Oral 3 times per day 05/08/24 1119 05/08/24 1120   05/08/24 1130  cefoTEtan  (CEFOTAN ) 2 g in sodium chloride  0.9 % 100 mL IVPB        2 g 200 mL/hr over 30 Minutes Intravenous On call to O.R. 05/08/24 1119 05/09/24 0749       Assessment/Plan: Colostomy takedown with delayed leak, new colostomy -very deconditioned -palliative care likely a good option and Dr Sheldon waiting for family next week -ID and TRH following -no real changes today  Elizabeth Odom 06/22/2024

## 2024-06-22 NOTE — Progress Notes (Signed)
 PROGRESS NOTE  Elizabeth Odom FMW:[id number] DOB: [birth year] DOA: 05/08/2024 PCP: Jesus Bernardino MATSU, MD   LOS: 45 days   Brief narrative:  Elizabeth Odom is a 83 y.o. female with a history of atrial fibrillation, dependent thrombosis and pulmonary embolism, anemia, PACs, hypertension, mild cognitive impairment, large bowel obstruction presented to hospital initially for surgical management of diverticulitis with planned ostomy takedown with anastomosis. During hospitalization, patient developed altered mental status secondary to polypharmacy in addition to atrial fibrillation with RVR. Cardiology was consulted.  On 6/24, patient underwent repeat laparoscopy converted to laparotomy with diverting loops transverse colostomy performed, in addition to drainage of an abdominal abscess and gastrostomy/rectal tube placement. Patient required admission to ICU post-operatively for respiratory failure but was extubated successfully on 6/25 and subsequent transferred out of ICU.   Infectious disease was consulted  for  diverticulitis with perforation and abscess with colostomy, s/p ostomy takedown and redo.  Intra-abdominal abscess grew polymicrobial with Pseudomonas, staph and Candida.  ID advised for meropenem  and daptomycin .  Patient  had persistent hypoxemia, abdomen has a drain, there is a left pleural effusion on chest x-ray.  Ultrasound-guided thoracocentesis done 7/10 with improved hypoxemia and saturating well on room air since 7/11.   Assessment/Plan: Principal Problem:   Diverticular disease Active Problems:   A-fib (HCC)   Restless leg   ABLA (acute blood loss anemia)   Need for emotional support   Acute urinary retention   Mixed hyperlipidemia   Essential hypertension   Diverticular disease of left colon   History of DVT (deep vein thrombosis)   History of pulmonary embolus (PE)   Pre-diabetes   Presence of IVC filter   Stricture of sigmoid colon (HCC)   Protein-calorie malnutrition,  severe   Hypokalemia   S/P percutaneous endoscopic gastrostomy (PEG) tube placement (HCC)   Goals of care, counseling/discussion   Shock, during or resulting from a surgical procedure  Assessment and Plan:   Persistent hypoxemia/pneumonia/pleural effusion B/L Status post left-sided thoracocentesis with removal of 670 mL of fluids.  Currently on room air.  Pleural fluid culture negative so far.   Persistent atrial fibrillation/flutter Cardiology was consulted and dose of metoprolol  was adjusted to 12.5 mg twice daily with as needed IV metoprolol .  Was on IV heparin  but due to anemia and concern for hematoma on hold.  Ultimate plan to transition to Eliquis  ultimately.  Hemoglobin of 10.0.  Will continue to monitor.  History of diverticulitis with perforation and abscess with colostomy status post ostomy takedown and re-do Ileus General Surgery and ID on board.   Urine culture (6/12) significant for Pseudomonas, MRSA, Enterococcus faecalis, Candida albicans.  Drain culture (6/17) significant for rare Pseudomonas aeruginosa.  Ongoing antibiotics per infectious disease.  Patient now on daptomycin , micafungin , Zosyn . Patient underwent diverting colostomy on 6/24 with gastrotomy tube placement.  Was initially on TPN which was weaned off and was started on tube feeds but at this time tube feeds have been on hold again and TPN has been restarted. Repeat CT abdomen/pelvis (7/5) significant for large fluid collections seen from prior CT imaging; IR consulted for drain, status post left abdominal drain placed on 7/7 Follow general surgery recommendations.  On full liquids at this time.  Intraabdominal abscess Infectious disease consulted. Culture data is polymicrobial and evident for pseudomonas aeruginosa, staphylococcus aureus and candida albicans. Candida treated with micafungin  and course is completed. Abdominal drain fluid culture (7/2) significant for enterococcus faecalis, clostridium innocuum, MRSA.  Left abdominal drain placed on  7/7 -ID recommendations: Meropenem , daptomycin  - Blood cultures 7/7 negative   Acute blood loss anemia Initially secondary to surgery, requiring multiple transfusions. Patient with recurrent significant anemia with concern for intraabdominal abscess vs hematoma. Patient then developed blood per rectum requiring multiple transfusions. Patient has received a total of 9 units of PRBC via transfusion.  Heparin  drip on hold at this time.  Contact dermatitis Noted on back. Very symptomatic with itching. Discussed with ID and not likely related to a drug reaction. No eosinophilia on differential. Continue Atarax , triamcinolone , barrier cream   AKI Resolved.  Latest creatinine of 0.4.   Hypoalbuminemia Noted. Associated poor nutrition. Patient is on TPN.   Leukocytosis Improved.   Thrombocytosis Mild likely reactive.   Hyponatremia Mild.  Latest sodium of 134.   Syncope Syncopal episode occurred during hospitalization while patient was on commode.  Concern for possible vasovagal versus hypotension etiology.  Transthoracic echo obtained with evidence of preserved LVEF of 60 to 65% in addition to no aortic stenosis.  Will continue to monitor.   Acute urinary retention External urinary catheter in place.    Acute metabolic encephalopathy Delirium Currently at baseline.   History of DVT/PE LLE swelling Patient previously did not tolerate anticoagulation, requiring the placement of a retrievable IVC filter on 10/05/2023. -Continue IVC filter and will avoid anticoagulation   Generalized weakness PT has recommended skilled nursing facility placement.   DVT prophylaxis: Place and maintain sequential compression device Start: 06/12/24 1619 SCD's Start: 05/08/24 1759   Disposition: PT has recommended skilled nursing facility.   Status is: Inpatient Remains inpatient appropriate because:  pending clinical improvement.    Code Status:     Code Status:  Full Code  Family Communication: none at bedside.    Consultants: General surgery Pallative care Infectious disease PCCM  Procedures: 5/29 elective colostomy takedown per Dr. Sheldon  6/10 Transgluteal drain placed per IR 6/17 CT guided drain placed into the abdominal and pelvic abscesses yielding pus and stool.   6/24 Lysis of adhesions, colostomy placement, drainage of abscess, and placement of G-tube,  6/25 extubated.  Anti-infectives:  Merrem , daptomycin   Anti-infectives (From admission, onward)    Start     Dose/Rate Route Frequency Ordered Stop   06/15/24 1600  meropenem  (MERREM ) 1 g in sodium chloride  0.9 % 100 mL IVPB        1 g 200 mL/hr over 30 Minutes Intravenous Every 8 hours 06/15/24 1510     06/15/24 1600  DAPTOmycin  (CUBICIN ) IVPB 700 mg/100mL premix        700 mg 200 mL/hr over 30 Minutes Intravenous Daily 06/15/24 1510     06/11/24 1545  piperacillin -tazobactam (ZOSYN ) IVPB 3.375 g  Status:  Discontinued        3.375 g 12.5 mL/hr over 240 Minutes Intravenous Every 8 hours 06/11/24 1457 06/15/24 1510   06/09/24 1745  DAPTOmycin  (CUBICIN ) IVPB 500 mg/50mL premix  Status:  Discontinued        500 mg 100 mL/hr over 30 Minutes Intravenous Daily 06/09/24 1647 06/13/24 1539   06/06/24 1000  micafungin  (MYCAMINE ) 100 mg in sodium chloride  0.9 % 100 mL IVPB  Status:  Discontinued        100 mg 105 mL/hr over 1 Hours Intravenous Every 24 hours 06/05/24 1349 06/12/24 1142   06/04/24 1400  piperacillin -tazobactam (ZOSYN ) IVPB 3.375 g        3.375 g 12.5 mL/hr over 240 Minutes Intravenous Every 8 hours 06/04/24 0738 06/09/24 2124   06/04/24  1400  DAPTOmycin  (CUBICIN ) IVPB 500 mg/50mL premix        8 mg/kg  60.9 kg 100 mL/hr over 30 Minutes Intravenous Daily 06/04/24 0738 06/10/24 0126   06/03/24 1000  neomycin  (MYCIFRADIN ) tablet 500 mg  Status:  Discontinued        500 mg Oral 3 times daily 06/03/24 0935 06/03/24 1555   06/03/24 1000  metroNIDAZOLE  (FLAGYL ) tablet  500 mg  Status:  Discontinued       Note to Pharmacy: Take 2 pills (=1000mg ) by mouth at 1pm, 3pm, and 10pm the day before your colorectal operation   500 mg Oral 3 times daily 06/03/24 0935 06/03/24 1555   05/29/24 1500  DAPTOmycin  (CUBICIN ) IVPB 500 mg/50mL premix  Status:  Discontinued        8 mg/kg  60.9 kg 100 mL/hr over 30 Minutes Intravenous Daily 05/29/24 1359 06/04/24 0738   05/28/24 2100  vancomycin  (VANCOREADY) IVPB 750 mg/150 mL  Status:  Discontinued        750 mg 150 mL/hr over 60 Minutes Intravenous Every 12 hours 05/28/24 1419 05/29/24 1359   05/27/24 0800  vancomycin  (VANCOCIN ) IVPB 1000 mg/200 mL premix  Status:  Discontinued        1,000 mg 200 mL/hr over 60 Minutes Intravenous Every 24 hours 05/26/24 1144 05/28/24 1419   05/23/24 1400  piperacillin -tazobactam (ZOSYN ) IVPB 3.375 g  Status:  Discontinued        3.375 g 12.5 mL/hr over 240 Minutes Intravenous Every 8 hours 05/23/24 0804 06/04/24 0738   05/23/24 1200  micafungin  (MYCAMINE ) 100 mg in sodium chloride  0.9 % 100 mL IVPB        100 mg 105 mL/hr over 1 Hours Intravenous Every 24 hours 05/23/24 0755 06/05/24 1221   05/22/24 0800  vancomycin  (VANCOCIN ) IVPB 1000 mg/200 mL premix  Status:  Discontinued        1,000 mg 200 mL/hr over 60 Minutes Intravenous Every 24 hours 05/21/24 0634 05/26/24 0806   05/21/24 0730  vancomycin  (VANCOREADY) IVPB 1250 mg/250 mL        1,250 mg 166.7 mL/hr over 90 Minutes Intravenous  Once 05/21/24 0634 05/21/24 1043   05/19/24 1400  piperacillin -tazobactam (ZOSYN ) IVPB 3.375 g  Status:  Discontinued        3.375 g 12.5 mL/hr over 240 Minutes Intravenous Every 8 hours 05/19/24 1303 05/23/24 0804   05/09/24 0900  erythromycin  250 mg in sodium chloride  0.9 % 100 mL IVPB        250 mg 100 mL/hr over 60 Minutes Intravenous Every 8 hours 05/09/24 0732 05/11/24 0044   05/08/24 2200  cefoTEtan  (CEFOTAN ) 2 g in sodium chloride  0.9 % 100 mL IVPB        2 g 200 mL/hr over 30 Minutes  Intravenous Every 12 hours 05/08/24 1759 05/09/24 0749   05/08/24 1400  neomycin  (MYCIFRADIN ) tablet 1,000 mg  Status:  Discontinued       Placed in And Linked Group   1,000 mg Oral 3 times per day 05/08/24 1119 05/08/24 1120   05/08/24 1400  metroNIDAZOLE  (FLAGYL ) tablet 1,000 mg  Status:  Discontinued       Placed in And Linked Group   1,000 mg Oral 3 times per day 05/08/24 1119 05/08/24 1120   05/08/24 1130  cefoTEtan  (CEFOTAN ) 2 g in sodium chloride  0.9 % 100 mL IVPB        2 g 200 mL/hr over 30 Minutes Intravenous On call to O.R. 05/08/24  1119 05/09/24 0749      Subjective: Today, patient was seen and examined at bedside.  Complains of not feeling well.  Mild abdominal discomfort.  Objective: Vitals:   06/22/24 0213 06/22/24 1047  BP: 133/77 (!) 151/76  Pulse: 96 90  Resp: 17 18  Temp: 98.5 F (36.9 C)   SpO2: 94% (!) 84%    Intake/Output Summary (Last 24 hours) at 06/22/2024 1147 Last data filed at 06/22/2024 1100 Gross per 24 hour  Intake 570 ml  Output 1590 ml  Net -1020 ml   Filed Weights   06/12/24 0351 06/13/24 0500 06/14/24 0500  Weight: 69.8 kg 69.8 kg 69.6 kg   Body mass index is 25.53 kg/m.   Physical Exam: GENERAL: Patient is alert awake and Communicative, Appears chronically ill, on nasal cannula oxygen, appears frail and deconditioned. HENT: No scleral pallor or icterus. Pupils equally reactive to light. Oral mucosa is moist NECK: is supple, no gross swelling noted. CHEST:  Diminished breath sounds bilaterally. CVS: S1 and S2 heard, no murmur. Regular rate and rhythm.  ABDOMEN: Soft, non-tender, bowel sounds are present.  Gastrostomy tube in place, abdominal drain in place.  External urinary catheter in place.  Colostomy. EXTREMITIES: No edema.  Right upper extremity PICC line in place CNS: Cranial nerves are intact.  Generalized weakness noted SKIN: warm and dry, abdominal incision with dressing  Data Review: I have personally reviewed the  following laboratory data and studies,  CBC: Recent Labs  Lab 06/16/24 0335 06/17/24 0445 06/20/24 0436 06/21/24 0343  WBC 10.8* 9.5 11.1* 10.5  NEUTROABS 7.2  --   --   --   HGB 10.5* 10.1* 11.0* 10.0*  HCT 33.8* 32.8* 35.8* 33.0*  MCV 92.1 91.6 91.6 93.0  PLT 413* 426* 489* 494*   Basic Metabolic Panel: Recent Labs  Lab 06/18/24 0333 06/19/24 0305 06/20/24 0436 06/21/24 0343 06/22/24 0327  NA 132* 129* 134* 134* 134*  K 4.0 4.0 4.3 3.9 4.2  CL 100 98 102 105 105  CO2 26 24 24 25 23   GLUCOSE 127* 118* 117* 123* 109*  BUN 23 25* 27* 29* 27*  CREATININE 0.38* 0.44 0.39* 0.41* 0.41*  CALCIUM  8.0* 7.9* 8.3* 8.1* 8.1*  MG 2.0 2.0 2.1 1.9 2.0  PHOS 3.2 3.0 3.0 2.7 3.1   Liver Function Tests: Recent Labs  Lab 06/16/24 0335 06/19/24 0305 06/21/24 0343  AST 23 32 36  ALT 17 29 29   ALKPHOS 299* 255* 294*  BILITOT 1.1 1.9* 1.5*  PROT 7.0 7.0 6.9  ALBUMIN  1.9* 1.9* 1.8*   No results for input(s): LIPASE, AMYLASE in the last 168 hours. No results for input(s): AMMONIA in the last 168 hours. Cardiac Enzymes: Recent Labs  Lab 06/16/24 0335  CKTOTAL 29*   BNP (last 3 results) No results for input(s): BNP in the last 8760 hours.  ProBNP (last 3 results) No results for input(s): PROBNP in the last 8760 hours.  CBG: Recent Labs  Lab 06/18/24 0615 06/18/24 1150 06/18/24 1743 06/19/24 0001 06/19/24 0718  GLUCAP 134* 158* 131* 136* 144*   Recent Results (from the past 240 hours)  Aerobic/Anaerobic Culture w Gram Stain (surgical/deep wound)     Status: None   Collection Time: 06/16/24  1:08 PM   Specimen: Abscess  Result Value Ref Range Status   Specimen Description   Final    ABSCESS ABDOMEN Performed at [facility], [address], [place], [place] [address]    Special Requests  Final    NONE Performed at [facility], [address], [place], [place] [address]    Gram Stain NO WBC SEEN NO ORGANISMS  SEEN   Final   Culture   Final    No growth aerobically or anaerobically. Performed at [facility], [address], [place], [place] [address]    Report Status 06/21/2024 FINAL  Final  Culture, Fungus without Smear     Status: None (Preliminary result)   Collection Time: 06/19/24  3:22 PM   Specimen: PATH Cytology Pleural fluid  Result Value Ref Range Status   Specimen Description   Final    PLEURAL CYTO Performed at [facility], [address] [place], [place] [address]    Special Requests   Final    NONE Performed at [facility], [address], [place], [place] [address]    Culture   Final    NO FUNGUS ISOLATED AFTER 2 DAYS Performed at [facility], [address] [place], [place] [address]    Report Status PENDING  Incomplete  Acid Fast Smear (AFB)     Status: None   Collection Time: 06/19/24  3:22 PM   Specimen: PATH Cytology Pleural fluid  Result Value Ref Range Status   AFB Specimen Processing Concentration  Final   Acid Fast Smear Negative  Final    Comment: (NOTE) Performed At: [facility] [address] [place], [place] [id number] Jennette Shorter MD Ey:[id number]    Source (AFB) PLEURAL  Final    Comment: Performed at [facility], [address] [place], [place] [address]  Body fluid culture w Gram Stain     Status: None (Preliminary result)   Collection Time: 06/19/24  3:22 PM   Specimen: PATH Cytology Pleural fluid  Result Value Ref Range Status   Specimen Description   Final    PLEURAL CYTO Performed at [facility], [address], [place], [place] [address]    Special Requests   Final    NONE Performed at [facility] [address], [place], [place] [address]    Gram Stain   Final    FEW WBC PRESENT, PREDOMINANTLY PMN NO ORGANISMS SEEN    Culture   Final    NO GROWTH 2 DAYS Performed at [facility], [address], [place], [place] [address]    Report Status PENDING  Incomplete     Studies: No results found.     Aliscia Clayton, MD  [facility] 06/22/2024  If 7PM-7AM, please contact night-coverage

## 2024-06-22 NOTE — Plan of Care (Signed)
  Problem: Education: Goal: Verbalization of understanding of the causes of altered bowel function will improve Outcome: Progressing   Problem: Bowel/Gastric: Goal: Gastrointestinal status for postoperative course will improve Outcome: Progressing   Problem: Health Behavior/Discharge Planning: Goal: Identification of community resources to assist with postoperative recovery needs will improve Outcome: Progressing   Problem: Nutritional: Goal: Will attain and maintain optimal nutritional status will improve Outcome: Progressing   Problem: Clinical Measurements: Goal: Postoperative complications will be avoided or minimized Outcome: Progressing   Problem: Respiratory: Goal: Respiratory status will improve Outcome: Progressing   Problem: Skin Integrity: Goal: Will show signs of wound healing Outcome: Progressing   Problem: Education: Goal: Knowledge of General Education information will improve Description: Including pain rating scale, medication(s)/side effects and non-pharmacologic comfort measures Outcome: Progressing   Problem: Health Behavior/Discharge Planning: Goal: Ability to manage health-related needs will improve Outcome: Progressing   Problem: Clinical Measurements: Goal: Ability to maintain clinical measurements within normal limits will improve Outcome: Progressing Goal: Will remain free from infection Outcome: Progressing Goal: Diagnostic test results will improve Outcome: Progressing Goal: Respiratory complications will improve Outcome: Progressing Goal: Cardiovascular complication will be avoided Outcome: Progressing   Problem: Activity: Goal: Risk for activity intolerance will decrease Outcome: Progressing   Problem: Coping: Goal: Level of anxiety will decrease Outcome: Progressing   Problem: Elimination: Goal: Will not experience complications related to bowel motility Outcome: Progressing Goal: Will not experience complications related to  urinary retention Outcome: Progressing   Problem: Pain Managment: Goal: General experience of comfort will improve and/or be controlled Outcome: Progressing   Problem: Safety: Goal: Ability to remain free from injury will improve Outcome: Progressing   Problem: Skin Integrity: Goal: Risk for impaired skin integrity will decrease Outcome: Progressing   Problem: Education: Goal: Ability to describe self-care measures that may prevent or decrease complications (Diabetes Survival Skills Education) will improve Outcome: Progressing Goal: Individualized Educational Video(s) Outcome: Progressing   Problem: Coping: Goal: Ability to adjust to condition or change in health will improve Outcome: Progressing   Problem: Fluid Volume: Goal: Ability to maintain a balanced intake and output will improve Outcome: Progressing   Problem: Health Behavior/Discharge Planning: Goal: Ability to identify and utilize available resources and services will improve Outcome: Progressing Goal: Ability to manage health-related needs will improve Outcome: Progressing   Problem: Metabolic: Goal: Ability to maintain appropriate glucose levels will improve Outcome: Progressing   Problem: Nutritional: Goal: Maintenance of adequate nutrition will improve Outcome: Progressing Goal: Progress toward achieving an optimal weight will improve Outcome: Progressing   Problem: Skin Integrity: Goal: Risk for impaired skin integrity will decrease Outcome: Progressing   Problem: Tissue Perfusion: Goal: Adequacy of tissue perfusion will improve Outcome: Progressing   Problem: Activity: Goal: Ability to tolerate increased activity will improve Outcome: Progressing   Problem: Respiratory: Goal: Ability to maintain a clear airway and adequate ventilation will improve Outcome: Progressing   Problem: Role Relationship: Goal: Method of communication will improve Outcome: Progressing

## 2024-06-23 DIAGNOSIS — S3692XA Contusion of unspecified intra-abdominal organ, initial encounter: Secondary | ICD-10-CM

## 2024-06-23 DIAGNOSIS — K5790 Diverticulosis of intestine, part unspecified, without perforation or abscess without bleeding: Secondary | ICD-10-CM | POA: Diagnosis not present

## 2024-06-23 DIAGNOSIS — K651 Peritoneal abscess: Secondary | ICD-10-CM

## 2024-06-23 LAB — COMPREHENSIVE METABOLIC PANEL WITH GFR
ALT: 47 U/L — ABNORMAL HIGH (ref 0–44)
AST: 55 U/L — ABNORMAL HIGH (ref 15–41)
Albumin: 1.8 g/dL — ABNORMAL LOW (ref 3.5–5.0)
Alkaline Phosphatase: 278 U/L — ABNORMAL HIGH (ref 38–126)
Anion gap: 8 (ref 5–15)
BUN: 23 mg/dL (ref 8–23)
CO2: 21 mmol/L — ABNORMAL LOW (ref 22–32)
Calcium: 8 mg/dL — ABNORMAL LOW (ref 8.9–10.3)
Chloride: 103 mmol/L (ref 98–111)
Creatinine, Ser: <0.3 mg/dL — ABNORMAL LOW (ref 0.44–1.00)
Glucose, Bld: 121 mg/dL — ABNORMAL HIGH (ref 70–99)
Potassium: 4 mmol/L (ref 3.5–5.1)
Sodium: 132 mmol/L — ABNORMAL LOW (ref 135–145)
Total Bilirubin: 1.7 mg/dL — ABNORMAL HIGH (ref 0.0–1.2)
Total Protein: 6.7 g/dL (ref 6.5–8.1)

## 2024-06-23 LAB — TRIGLYCERIDES: Triglycerides: 194 mg/dL — ABNORMAL HIGH (ref <150)

## 2024-06-23 LAB — PHOSPHORUS: Phosphorus: 2.8 mg/dL (ref 2.5–4.6)

## 2024-06-23 LAB — CK: Total CK: 31 U/L — ABNORMAL LOW (ref 38–234)

## 2024-06-23 LAB — MAGNESIUM: Magnesium: 1.9 mg/dL (ref 1.7–2.4)

## 2024-06-23 LAB — PREALBUMIN: Prealbumin: 18 mg/dL (ref 18–38)

## 2024-06-23 MED ORDER — SODIUM PHOSPHATES 45 MMOLE/15ML IV SOLN
15.0000 mmol | Freq: Once | INTRAVENOUS | Status: AC
  Administered 2024-06-23: 15 mmol via INTRAVENOUS
  Filled 2024-06-23: qty 5

## 2024-06-23 MED ORDER — HYDROMORPHONE HCL 1 MG/ML IJ SOLN
1.0000 mg | INTRAMUSCULAR | Status: DC | PRN
  Administered 2024-06-23 – 2024-06-25 (×4): 1 mg via INTRAVENOUS
  Filled 2024-06-23 (×5): qty 1

## 2024-06-23 MED ORDER — GABAPENTIN 100 MG PO CAPS
300.0000 mg | ORAL_CAPSULE | Freq: Three times a day (TID) | ORAL | Status: DC
  Administered 2024-06-23 – 2024-06-24 (×5): 300 mg via ORAL
  Filled 2024-06-23 (×6): qty 3

## 2024-06-23 MED ORDER — OCTREOTIDE ACETATE 100 MCG/ML IJ SOLN
100.0000 ug | Freq: Three times a day (TID) | INTRAMUSCULAR | Status: DC
  Administered 2024-06-23 – 2024-06-25 (×6): 100 ug via SUBCUTANEOUS
  Filled 2024-06-23 (×8): qty 1

## 2024-06-23 MED ORDER — TRAVASOL 10 % IV SOLN
INTRAVENOUS | Status: AC
  Filled 2024-06-23: qty 940.8

## 2024-06-23 MED ORDER — OCTREOTIDE ACETATE 20 MG IM KIT: 20.0000 mg | PACK | Freq: Once | INTRAMUSCULAR | Status: DC

## 2024-06-23 NOTE — Progress Notes (Signed)
 PHARMACY - TOTAL PARENTERAL NUTRITION CONSULT NOTE   Indication: Prolonged ileus  Patient Measurements: Height: 5' 5 (165.1 cm) Weight:  (Unable to weight pt due to zeroed bed) IBW/kg (Calculated) : 57 TPN AdjBW (KG): 61.3 Body mass index is 25.53 kg/m.  Assessment: Elizabeth Odom with history of diverticulitis with perforation and abscess. 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 She requested ostomy takedown and underwent LOA, colostomy takedown, small bowel resection with anastomosis on 5/29. Pharmacy is consulted to dose TPN starting 6/2 for postop ileus.  Post-op course complicated by anastomotic leak, multiple-intraabdominal abscesses. Post-op bleeding. Patient is now s/p diverting loop transverse colostomy.  Glucose / Insulin : no Hx DM, A1c 5.1%; CBG goal 100-150 - SBGs remain < 150 - Off SSI since 6/28 Electrolytes:  Per Dr. Sheldon, keep K>4, Mg>2, Phos>3.  - Na remains borderline low; bicarb now also low; all other lytes stable WNL including CoCa 9.8 - Except for Phos, electrolyte goals for ileus met as per the above Renal: SCr stable WNL (<1.0); BUN now decreased to WNL; UOP slightly low (<0.5 ml/kg/hr) after stopping daily Lasix  Hepatic: LFTs, Tbili, and TG all borderline elevated and relatively stable I/O: - Gastrostomy (venting): back up to >400 ml/d - J-P Drain OP stable ~100 ml/d - no mIVF - Colostomy OP stable around 100-200 ml/d; continues to have some rectal output (CCS suspects d/t leakage into abdomen at ileal anastomosis, with resultant creation of ileo-rectal and possible ileovesical fistula); one episode recorded yesterday, although no longer charting volumes - PO intake: improved over the weekend to ~800 ml/d  GI Imaging:  - 6/9 CTa/p: Dehiscence of rectal anastomosis w/ associated air & fluid collections - 6/16 CTa/p: minimal change from 6/9 -  6/23 CTa/p: possibly occluded JP drain, rectal anastomosis with dehiscence cavity communicating to small bowel; several fistulas of sigmoid colon with associated gas and multiple small loculated fluid collection at new percutaneous drainage catheter site. Additional small loculated fluid collection small bowel mesentery.  - 7/1 CTa/p: Multiple complex fluid collections within the upper abdomen  - 7/5 CTa/p: No well-defined or clear enterorectal fistula. Persistent fluid collections again noted in the abdomen and pelvis, possibly from prior hemorrhage, unchanged from prior. Enlarging anterior pelvic fluid collection at prior drain site  GI Surgeries / Procedures: - 5/29: LAR, SBR, colostomy takedown, LOA - 6/10 drains placed x 2 in IR (midline abd, transgluteal) - 6/17: Replacement of drainage catheter by IR - 6/24: diverting loop transverse colostomy, I&D of abdominal abscess, gastrotomy tube placement, rectal tube placement.  - 7/3: CT guided visceral fluid drain by perc cath - 7/7: CT drainage of the left anterior abdominal fluid collection  Central access: Double lumen PICC placed 6/2 TPN start date: 6/2>7/1, resume 7/4>>  Nutritional Goals: Goal TPN rate is 70 mL/hr (provides 94 g of protein and 1680 kcals per day)  RD Assessment:  Estimated Needs Total Energy Estimated Needs: 1600-1750 kcals Total Protein Estimated Needs: 80-95 grams Total Fluid Estimated Needs: >/= 1.6L  Current Nutrition:  7/9 FLD, TPN at goal rate  Multiple calorie counts this admission. Supplements discontinued d/t continued refusal. Underwent G-tube placement 6/24. Tube feeds started 6/26, stopped for abdominal distention and discomfort. Restarted 6/30 >> stopped 7/3.  Plan:  Now: NaPhos 15 mmol IV x1  At 1800, continue TPN with lipids at 70 ml/hr (goal rate) Will decrease lipids and dextrose  slightly given rising LFTs  Electrolytes in TPN:  increase K slightly to maintain goals; increase Ac Na 154 mEq/L  (max) K 65 >> 70 mEq/L  Ca 5 mEq/L Mg 10 mEq/L (max) Phos 25 mmol/L (max) Cl:Ac 2:1 >> 1:2 Continue MVI & trace in TPN Chromium on hold d/t critical shortage No further CBG checks with excellent control; trend BG on routine Bmet MIVF per MD (none currently) Monitor TPN labs on Mon/Thurs, and as needed Phos tomorrow Recheck TG with Thurs labs (normally qMon only)    Thank you for allowing pharmacy to be a part of this patient's care.  Bard Jeans, PharmD, BCPS [phone] 06/23/2024, 7:21 AM

## 2024-06-23 NOTE — Progress Notes (Signed)
 Nutrition Brief Note  Chart reviewed. Pt now transitioning to comfort care.  No further nutrition interventions planned at this time.    Tilda Franco, MS, RD, LDN Inpatient Clinical Dietitian Contact via Secure chat

## 2024-06-23 NOTE — TOC Progression Note (Addendum)
 Transition of Care [facility]) - Progression Note    Patient Details  Name: Elizabeth Odom MRN: [id number] Date of Birth: 1941-03-24  Transition of Care [place]) CM/SW Contact  Alfonse JONELLE Rex, RN Phone Number: 06/23/2024, 11:56 AM  Clinical Narrative:   Surgery Progress Note 06/23/24: remains on TPN, worry we have hit a point of futility. Her nutrition has not improved. She will not interact with therapies. Palliative Medicine Team referral. TOC will continue to follow.   -2:51pm TOC consult for residential hospice placement. NCM called to patient's grand daughter, [name] , introduced self and role of TOC/NCM,  grand daughter confirmed agreement with inpatient hospice, list of Hospice Providers emailed to grand daughter at [email or web address] per her request, await hospice provider choice.     Expected Discharge Plan: (P) Skilled Nursing Facility Barriers to Discharge: (P) Continued Medical Work up, SNF Pending bed offer, Conservator, museum/gallery and Services   Discharge Planning Services: CM Consult   Living arrangements for the past 2 months: Apartment                                       Social Determinants of Health (SDOH) Interventions SDOH Screenings   Food Insecurity: No Food Insecurity (05/09/2024)  Housing: Low Risk  (05/09/2024)  Transportation Needs: No Transportation Needs (05/09/2024)  Utilities: Not At Risk (05/09/2024)  Depression (PHQ2-9): Low Risk  (01/02/2024)  Social Connections: Unknown (05/09/2024)  Tobacco Use: Low Risk  (05/08/2024)    Readmission Risk Interventions    05/12/2024    2:23 PM 05/09/2024    2:46 PM 10/19/2023    6:13 PM  Readmission Risk Prevention Plan  Transportation Screening Complete Complete Complete  PCP or Specialist Appt within 5-7 Days   Complete  PCP or Specialist Appt within 3-5 Days Complete Complete   Home Care Screening   Complete  Medication Review (RN CM)   Complete  HRI or Home Care  Consult Complete Complete   Social Work Consult for Recovery Care Planning/Counseling Complete Complete   Palliative Care Screening Not Applicable Not Applicable   Medication Review Oceanographer) Complete Complete

## 2024-06-23 NOTE — Progress Notes (Signed)
 PROGRESS NOTE  Elizabeth Odom FMW:[id number] DOB: [birth year] DOA: 05/08/2024 PCP: Jesus Bernardino MATSU, MD   LOS: 46 days   Brief narrative:  Elizabeth Odom is a 83 y.o. female with a history of atrial fibrillation, dependent thrombosis and pulmonary embolism, anemia, PACs, hypertension, mild cognitive impairment, large bowel obstruction presented to hospital initially for surgical management of diverticulitis with planned ostomy takedown with anastomosis. During hospitalization, patient developed altered mental status secondary to polypharmacy in addition to atrial fibrillation with RVR. Cardiology was consulted.  On 6/24, patient underwent repeat laparoscopy converted to laparotomy with diverting loops transverse colostomy performed, in addition to drainage of an abdominal abscess and gastrostomy/rectal tube placement. Patient required admission to ICU post-operatively for respiratory failure but was extubated successfully on 6/25 and subsequent transferred out of ICU.  Infectious disease was consulted  for  diverticulitis with perforation and abscess with colostomy, s/p ostomy takedown and redo.  Intra-abdominal abscess grew polymicrobial with Pseudomonas, staph and Candida.  ID advised for meropenem  and daptomycin .  Patient  had persistent hypoxemia, abdomen has a drain, there is a left pleural effusion on chest x-ray.  Ultrasound-guided thoracocentesis done 7/10 with improved hypoxemia and saturating well on room air since 7/11.   Assessment/Plan: Principal Problem:   Diverticular disease Active Problems:   A-fib (HCC)   Restless leg   ABLA (acute blood loss anemia)   Need for emotional support   Acute urinary retention   Mixed hyperlipidemia   Essential hypertension   Diverticular disease of left colon   History of DVT (deep vein thrombosis)   History of pulmonary embolus (PE)   Pre-diabetes   Presence of IVC filter   Stricture of sigmoid colon (HCC)   Protein-calorie malnutrition,  severe   Hypokalemia   S/P percutaneous endoscopic gastrostomy (PEG) tube placement (HCC)   Goals of care, counseling/discussion   Shock, during or resulting from a surgical procedure  Assessment and Plan:   pneumonia/pleural effusion B/L with hypoxemia Improved at this time.  Status post left-sided thoracocentesis with removal of 670 mL of fluids.  Currently on room air.  Pleural fluid culture negative so far.   Persistent atrial fibrillation/flutter Cardiology was consulted and dose of metoprolol  was adjusted to 12.5 mg twice daily with as needed IV metoprolol .  Was on IV heparin  but due to anemia and concern for hematoma on hold.  Ultimate plan to transition to Eliquis  ultimately.  Hemoglobin of 10.0.  Will continue to monitor.  Overall rate controlled at this time.  History of diverticulitis with perforation and abscess with colostomy status post ostomy takedown and re-do Ileus General Surgery and ID on board.   Urine culture (6/12) significant for Pseudomonas, MRSA, Enterococcus faecalis, Candida albicans.  Drain culture (6/17) significant for rare Pseudomonas aeruginosa.  Ongoing antibiotics per infectious disease.  Patient now on daptomycin , micafungin , Zosyn . Patient underwent diverting colostomy on 6/24 with gastrotomy tube placement.  Was initially on TPN which was weaned off and was started on tube feeds but at this time tube feeds have been on hold again and TPN has been restarted. Repeat CT abdomen/pelvis (7/5) significant for large fluid collections seen from prior CT imaging; IR  placed in left abdominal drain placed on 7/7 Follow general surgery recommendations.  On full liquids  Intraabdominal abscess Infectious disease consulted. Culture data is polymicrobial and evident for pseudomonas aeruginosa, staphylococcus aureus and candida albicans. Candida treated with micafungin  and course is completed. Abdominal drain fluid culture (7/2) significant for enterococcus faecalis,  clostridium  innocuum, MRSA. Left abdominal drain placed on 7/7 -ID recommendations: Meropenem , daptomycin  - Blood cultures 7/7 negative   Acute blood loss anemia Initially secondary to surgery, requiring multiple transfusions. Patient with recurrent significant anemia with concern for intraabdominal abscess vs hematoma. Patient then developed blood per rectum requiring multiple transfusions. Patient has received a total of 9 units of PRBC via transfusion.  Heparin  drip on hold at this time.  Contact dermatitis Noted on back. Very symptomatic with itching. Discussed with ID and not likely related to a drug reaction. No eosinophilia on differential. Continue Atarax , triamcinolone , barrier cream.  Improving.   AKI Resolved.  Latest creatinine of 0.3.   Hypoalbuminemia Poor nutrition.  Patient is on TPN.   Leukocytosis Improved.   Thrombocytosis Mild likely reactive.   Hyponatremia Mild.  Latest sodium of 132 and will continue to monitor..   Syncope Syncopal episode occurred during hospitalization while patient was on commode.  Concern for possible vasovagal versus hypotension etiology.  Transthoracic echo obtained with evidence of preserved LVEF of 60 to 65% in addition to no aortic stenosis.    Acute urinary retention External urinary catheter in place at this time without further retention.   Acute metabolic encephalopathy Delirium Currently at baseline.   History of DVT/PE LLE swelling Patient previously did not tolerate anticoagulation, requiring the placement of a retrievable IVC filter on 10/05/2023. -Continue IVC filter and will avoid anticoagulation   Generalized weakness PT has recommended skilled nursing facility placement.   DVT prophylaxis: Place and maintain sequential compression device Start: 06/12/24 1619 SCD's Start: 05/08/24 1759   Disposition: PT has recommended skilled nursing facility.   Status is: Inpatient Remains inpatient appropriate because:   pending clinical improvement.    Code Status:     Code Status: Full Code  Family Communication: none at bedside.    Consultants: General surgery Pallative care Infectious disease PCCM  Procedures: 5/29 elective colostomy takedown per Dr. Sheldon  6/10 Transgluteal drain placed per IR 6/17 CT guided drain placed into the abdominal and pelvic abscesses yielding pus and stool.   6/24 Lysis of adhesions, colostomy placement, drainage of abscess, and placement of G-tube,  6/25 extubated.  Anti-infectives:  Merrem , daptomycin   Anti-infectives (From admission, onward)    Start     Dose/Rate Route Frequency Ordered Stop   06/15/24 1600  meropenem  (MERREM ) 1 g in sodium chloride  0.9 % 100 mL IVPB        1 g 200 mL/hr over 30 Minutes Intravenous Every 8 hours 06/15/24 1510     06/15/24 1600  DAPTOmycin  (CUBICIN ) IVPB 700 mg/100mL premix        700 mg 200 mL/hr over 30 Minutes Intravenous Daily 06/15/24 1510     06/11/24 1545  piperacillin -tazobactam (ZOSYN ) IVPB 3.375 g  Status:  Discontinued        3.375 g 12.5 mL/hr over 240 Minutes Intravenous Every 8 hours 06/11/24 1457 06/15/24 1510   06/09/24 1745  DAPTOmycin  (CUBICIN ) IVPB 500 mg/50mL premix  Status:  Discontinued        500 mg 100 mL/hr over 30 Minutes Intravenous Daily 06/09/24 1647 06/13/24 1539   06/06/24 1000  micafungin  (MYCAMINE ) 100 mg in sodium chloride  0.9 % 100 mL IVPB  Status:  Discontinued        100 mg 105 mL/hr over 1 Hours Intravenous Every 24 hours 06/05/24 1349 06/12/24 1142   06/04/24 1400  piperacillin -tazobactam (ZOSYN ) IVPB 3.375 g        3.375 g 12.5 mL/hr  over 240 Minutes Intravenous Every 8 hours 06/04/24 0738 06/09/24 2124   06/04/24 1400  DAPTOmycin  (CUBICIN ) IVPB 500 mg/50mL premix        8 mg/kg  60.9 kg 100 mL/hr over 30 Minutes Intravenous Daily 06/04/24 0738 06/10/24 0126   06/03/24 1000  neomycin  (MYCIFRADIN ) tablet 500 mg  Status:  Discontinued        500 mg Oral 3 times daily 06/03/24  0935 06/03/24 1555   06/03/24 1000  metroNIDAZOLE  (FLAGYL ) tablet 500 mg  Status:  Discontinued       Note to Pharmacy: Take 2 pills (=1000mg ) by mouth at 1pm, 3pm, and 10pm the day before your colorectal operation   500 mg Oral 3 times daily 06/03/24 0935 06/03/24 1555   05/29/24 1500  DAPTOmycin  (CUBICIN ) IVPB 500 mg/50mL premix  Status:  Discontinued        8 mg/kg  60.9 kg 100 mL/hr over 30 Minutes Intravenous Daily 05/29/24 1359 06/04/24 0738   05/28/24 2100  vancomycin  (VANCOREADY) IVPB 750 mg/150 mL  Status:  Discontinued        750 mg 150 mL/hr over 60 Minutes Intravenous Every 12 hours 05/28/24 1419 05/29/24 1359   05/27/24 0800  vancomycin  (VANCOCIN ) IVPB 1000 mg/200 mL premix  Status:  Discontinued        1,000 mg 200 mL/hr over 60 Minutes Intravenous Every 24 hours 05/26/24 1144 05/28/24 1419   05/23/24 1400  piperacillin -tazobactam (ZOSYN ) IVPB 3.375 g  Status:  Discontinued        3.375 g 12.5 mL/hr over 240 Minutes Intravenous Every 8 hours 05/23/24 0804 06/04/24 0738   05/23/24 1200  micafungin  (MYCAMINE ) 100 mg in sodium chloride  0.9 % 100 mL IVPB        100 mg 105 mL/hr over 1 Hours Intravenous Every 24 hours 05/23/24 0755 06/05/24 1221   05/22/24 0800  vancomycin  (VANCOCIN ) IVPB 1000 mg/200 mL premix  Status:  Discontinued        1,000 mg 200 mL/hr over 60 Minutes Intravenous Every 24 hours 05/21/24 0634 05/26/24 0806   05/21/24 0730  vancomycin  (VANCOREADY) IVPB 1250 mg/250 mL        1,250 mg 166.7 mL/hr over 90 Minutes Intravenous  Once 05/21/24 0634 05/21/24 1043   05/19/24 1400  piperacillin -tazobactam (ZOSYN ) IVPB 3.375 g  Status:  Discontinued        3.375 g 12.5 mL/hr over 240 Minutes Intravenous Every 8 hours 05/19/24 1303 05/23/24 0804   05/09/24 0900  erythromycin  250 mg in sodium chloride  0.9 % 100 mL IVPB        250 mg 100 mL/hr over 60 Minutes Intravenous Every 8 hours 05/09/24 0732 05/11/24 0044   05/08/24 2200  cefoTEtan  (CEFOTAN ) 2 g in sodium  chloride 0.9 % 100 mL IVPB        2 g 200 mL/hr over 30 Minutes Intravenous Every 12 hours 05/08/24 1759 05/09/24 0749   05/08/24 1400  neomycin  (MYCIFRADIN ) tablet 1,000 mg  Status:  Discontinued       Placed in And Linked Group   1,000 mg Oral 3 times per day 05/08/24 1119 05/08/24 1120   05/08/24 1400  metroNIDAZOLE  (FLAGYL ) tablet 1,000 mg  Status:  Discontinued       Placed in And Linked Group   1,000 mg Oral 3 times per day 05/08/24 1119 05/08/24 1120   05/08/24 1130  cefoTEtan  (CEFOTAN ) 2 g in sodium chloride  0.9 % 100 mL IVPB  2 g 200 mL/hr over 30 Minutes Intravenous On call to O.R. 05/08/24 1119 05/09/24 0749      Subjective: Today, patient was seen and examined at bedside.  Still complaining of not feeling well with abdominal discomfort.  Denies any nausea vomiting shortness of breath or dyspnea.  Objective: Vitals:   06/23/24 0504 06/23/24 1057  BP: (!) 145/81 109/89  Pulse: 92 (!) 111  Resp: 16   Temp: 97.8 F (36.6 C)   SpO2: 95%     Intake/Output Summary (Last 24 hours) at 06/23/2024 1156 Last data filed at 06/23/2024 1000 Gross per 24 hour  Intake 2506.48 ml  Output 770 ml  Net 1736.48 ml   Filed Weights   06/12/24 0351 06/13/24 0500 06/14/24 0500  Weight: 69.8 kg 69.8 kg 69.6 kg   Body mass index is 25.53 kg/m.   Physical Exam: General: Thinly built, appears frail and deconditioned., not in obvious distress, chronically HENT:   Mild pallor noted.  Oral mucosa is moist.  Chest: Diminished breath sounds bilaterally.  CVS: S1 &S2 heard. No murmur.  Regular rate and rhythm. Abdomen: Soft, gastrostomy tube in place., abdominal drain in place.  External urinary catheter in place.  Colostomy noted. Extremities: No cyanosis, clubbing or edema.  Peripheral pulses are palpable.  Right upper extremity PICC line in place. Psych: Alert, awake and oriented, normal mood CNS:  No cranial nerve deficits.  Moves all extremities.  Generalized weakness  noted Skin: Warm and dry, abdominal incision with dressing.  Data Review: I have personally reviewed the following laboratory data and studies,  CBC: Recent Labs  Lab 06/17/24 0445 06/20/24 0436 06/21/24 0343  WBC 9.5 11.1* 10.5  HGB 10.1* 11.0* 10.0*  HCT 32.8* 35.8* 33.0*  MCV 91.6 91.6 93.0  PLT 426* 489* 494*   Basic Metabolic Panel: Recent Labs  Lab 06/19/24 0305 06/20/24 0436 06/21/24 0343 06/22/24 0327 06/23/24 0232  NA 129* 134* 134* 134* 132*  K 4.0 4.3 3.9 4.2 4.0  CL 98 102 105 105 103  CO2 24 24 25 23  21*  GLUCOSE 118* 117* 123* 109* 121*  BUN 25* 27* 29* 27* 23  CREATININE 0.44 0.39* 0.41* 0.41* <0.30*  CALCIUM  7.9* 8.3* 8.1* 8.1* 8.0*  MG 2.0 2.1 1.9 2.0 1.9  PHOS 3.0 3.0 2.7 3.1 2.8   Liver Function Tests: Recent Labs  Lab 06/19/24 0305 06/21/24 0343 06/23/24 0232  AST 32 36 55*  ALT 29 29 47*  ALKPHOS 255* 294* 278*  BILITOT 1.9* 1.5* 1.7*  PROT 7.0 6.9 6.7  ALBUMIN  1.9* 1.8* 1.8*   No results for input(s): LIPASE, AMYLASE in the last 168 hours. No results for input(s): AMMONIA in the last 168 hours. Cardiac Enzymes: Recent Labs  Lab 06/23/24 0232  CKTOTAL 31*   BNP (last 3 results) No results for input(s): BNP in the last 8760 hours.  ProBNP (last 3 results) No results for input(s): PROBNP in the last 8760 hours.  CBG: Recent Labs  Lab 06/18/24 0615 06/18/24 1150 06/18/24 1743 06/19/24 0001 06/19/24 0718  GLUCAP 134* 158* 131* 136* 144*   Recent Results (from the past 240 hours)  Aerobic/Anaerobic Culture w Gram Stain (surgical/deep wound)     Status: None   Collection Time: 06/16/24  1:08 PM   Specimen: Abscess  Result Value Ref Range Status   Specimen Description   Final    ABSCESS ABDOMEN Performed at [facility], [address], [place], [place] [address]    Special  Requests   Final    NONE Performed at [facility], [address] [place], [place] [address]     Gram Stain NO WBC SEEN NO ORGANISMS SEEN   Final   Culture   Final    No growth aerobically or anaerobically. Performed at [facility], [address], [place], [place] [address]    Report Status 06/21/2024 FINAL  Final  Fungus Culture With Stain     Status: None (Preliminary result)   Collection Time: 06/19/24  3:22 PM   Specimen: PATH Cytology Pleural fluid  Result Value Ref Range Status   Fungus Stain Final report  Final    Comment: (NOTE) Performed At: [facility] [address] [place], [place] [id number] Jennette Shorter MD Ey:[id number]    Fungus (Mycology) Culture PENDING  Incomplete   Fungal Source PLEURAL  Final    Comment: Performed at [facility], [address], [place], [place] [address]  Culture, Fungus without Smear     Status: None (Preliminary result)   Collection Time: 06/19/24  3:22 PM   Specimen: PATH Cytology Pleural fluid  Result Value Ref Range Status   Specimen Description   Final    PLEURAL CYTO Performed at [facility], [address], [place], [place] [address]    Special Requests   Final    NONE Performed at [facility], [address] [place], [place] [address]    Culture   Final    NO FUNGUS ISOLATED AFTER 3 DAYS Performed at [facility], [address], [place], [place] [address]    Report Status PENDING  Incomplete  Acid Fast Smear (AFB)     Status: None   Collection Time: 06/19/24  3:22 PM   Specimen: PATH Cytology Pleural fluid  Result Value Ref Range Status   AFB Specimen Processing Concentration  Final   Acid Fast Smear Negative  Final    Comment: (NOTE) Performed At: [facility] [address] [place], [place] [id number] Jennette Shorter MD Ey:[id number]    Source (AFB) PLEURAL  Final    Comment: Performed at [facility], [address] [place], [place] [address]  Body fluid culture w Gram Stain     Status: None    Collection Time: 06/19/24  3:22 PM   Specimen: PATH Cytology Pleural fluid  Result Value Ref Range Status   Specimen Description   Final    PLEURAL CYTO Performed at [facility], [address], [place], [place] [address]    Special Requests   Final    NONE Performed at [facility], [address] [place], [place] [address]    Gram Stain   Final    FEW WBC PRESENT, PREDOMINANTLY PMN NO ORGANISMS SEEN    Culture   Final    NO GROWTH 3 DAYS Performed at [facility], [address], [place], [place] [address]    Report Status 06/22/2024 FINAL  Final  Fungus Culture Result     Status: None   Collection Time: 06/19/24  3:22 PM  Result Value Ref Range Status   Result 1 Comment  Final    Comment: (NOTE) KOH/Calcofluor preparation:  no fungus observed. Performed At: [facility] [address] [place], [place] [id number] Jennette Shorter MD Ey:[id number]      Studies: No results found.     Dajohn Ellender, MD  [facility] 06/23/2024  If  7PM-7AM, please contact night-coverage

## 2024-06-23 NOTE — Progress Notes (Addendum)
 [facility]    Date of Admission:  05/08/2024      ID: Elizabeth Odom is a 83 y.o. female with   Principal Problem:   Diverticular disease Active Problems:   A-fib (HCC)   Restless leg   ABLA (acute blood loss anemia)   Need for emotional support   Acute urinary retention   Mixed hyperlipidemia   Essential hypertension   Diverticular disease of left colon   History of DVT (deep vein thrombosis)   History of pulmonary embolus (PE)   Pre-diabetes   Presence of IVC filter   Stricture of sigmoid colon (HCC)   Protein-calorie malnutrition, severe   Hypokalemia   S/P percutaneous endoscopic gastrostomy (PEG) tube placement (HCC)   Goals of care, counseling/discussion   Shock, during or resulting from a surgical procedure    Subjective: Reviewed chart/primary team surgical progress note -- concern for medical futility and planning further family discussion in terms of non-progress with intraabd abscess/fistula  Remains on tpn  Patient said abd pain controlled with medication  No n/v/rash  Remains in bed   Drains placement and cultures -- as of today 06/23/24; there is a left anterior perc drain to jp suction, a g-tube, a colostomy, and an open anterior abd wall incision 7/10 left us  guided thoracentesis -- cx ngtd; exudative per lyte criteria 7/07 ct guided left anterior abd fluid collection perc-drain; cx negative 7/02 ct guided in one of anterior abd fluid collection - cx psa (R ceftaz, piptazo, cefepime; S cipro/imipenem), mrsa (R bactrim; S doxy), e faecalis 6/17 ct guided exchange of 1 anterior midline fluid collection; placement of posterior transgluteal drain; cx psA, c albicans 6/12 ??old drain fluid culture psa, mrsa, e faecalis, c albican 6/10 ct guided placement of 2 drains -- one anterior abdomen and one transgluteal   Last surgical procedure 06/03/24: PRE-OPERATIVE DIAGNOSIS:   DELAYED COLORECTAL ANASTOMOTIC LEAK, NEED FOR FECAL  DIVERSION   POST-OPERATIVE DIAGNOSIS:   DELAYED COLORECTAL ANASTOMOTIC LEAK, NEED FOR FECAL DIVERSION CHRONIC PELVIC ABSCESS   PROCEDURE:   DIAGNOSTIC LAPAROSCOPY CONVERTED TO LAPAROTOMY DIVERTING LOOP TRANSVERSE COLOSTOMY DRAINAGE OF ABDOMINAL ABSCESS GASTROSTOMY TUBE PLACEMENT RECTAL TUBE PLACEMENT  Medications:   acetaminophen   1,000 mg Per Tube Q6H   Chlorhexidine  Gluconate Cloth  6 each Topical Daily   gabapentin   300 mg Oral TID   liver oil-zinc  oxide   Topical BID   metoprolol  tartrate  12.5 mg Per Tube BID   nystatin    Topical BID   octreotide   100 mcg Subcutaneous Q8H   Opium   6 mg Per Tube QID   oxyCODONE   5 mg Per Tube QHS   polycarbophil  1,250 mg Per Tube BID   sodium chloride  flush  10-40 mL Intracatheter Q12H   sodium chloride  flush  3 mL Intravenous Q12H   sodium chloride  flush  5 mL Intracatheter Q8H   tamsulosin   0.4 mg Oral QPC breakfast    Objective: Vital signs in last 24 hours: Temp:  [97.4 F (36.3 C)-98.1 F (36.7 C)] 97.8 F (36.6 C) (07/14 0504) Pulse Rate:  [87-111] 111 (07/14 1057) Resp:  [16-17] 16 (07/14 0504) BP: (109-145)/(70-89) 109/89 (07/14 1057) SpO2:  [95 %-100 %] 95 % (07/14 0504)  Physical Exam  Constitutional:  chronically ill appearing; oriented to person, place, and time. No distress; conversant slow/weak voice HENT: [place]/AT, PERRLA, no scleral icterus Cardiovascular: Normal rate, regular rhythm and normal heart sounds. No murmur heard.  Pulmonary/Chest: normal respiratory  effort Abdominal: Soft. Bowel sounds are slow. colostomy-small amount of liquid in bag < 10mL; g-tube present; anterior midline open wound packing in place slightly stained serosanguinous; no sign of cellulitis/purulence Neurological: alert and oriented to person, place, and time. Generalized weakness Skin: no rash   Central line: rue picc site no erythema/puruelence   Lab Results Recent Labs    06/21/24 0343 06/22/24 0327 06/23/24 0232  WBC 10.5   --   --   HGB 10.0*  --   --   HCT 33.0*  --   --   NA 134* 134* 132*  K 3.9 4.2 4.0  CL 105 105 103  CO2 25 23 21*  BUN 29* 27* 23  CREATININE 0.41* 0.41* <0.30*   Liver Panel Recent Labs    06/21/24 0343 06/23/24 0232  PROT 6.9 6.7  ALBUMIN  1.8* 1.8*  AST 36 55*  ALT 29 47*  ALKPHOS 294* 278*  BILITOT 1.5* 1.7*   Sedimentation Rate No results for input(s): ESRSEDRATE in the last 72 hours. C-Reactive Protein No results for input(s): CRP in the last 72 hours.  Microbiology: Reviewed See in subjective  Studies/Results: No results found.    Assessment/Plan: 83yo F with slow anastomosis leak, s/p diverting loop transverse colostomy, gastrostomy tube,on 06/03/24. C/b intra abdominal hematoma and abscess, and concern for colonic fistula -- currently on tpn/bowel rest  Cx polymicrobial psa, mrsa, e faecalis; at one point candida albican  Bilateral pleural effusion = diagnostic thoracentesis 7/10 left sided -- exudative by light's criteria, but cultures negative thus far. No sign of empyema   -noted and agree goals of care discussion; palliative care on board -continue current dapto/meropenem  for now and will review for further follow up diagnostic pending result of goals of care discussion -maintain on contact isolation precaution -weekly cpk while on dapto -one thing concerning from id standpoint is the pseudomonas had become resistant to cefepime/piptazo and source control lacking will become more resistant rather quickly      [name]   06/23/2024, 12:13 PM

## 2024-06-23 NOTE — Progress Notes (Signed)
 PT Note  Patient Details Name: Elizabeth Odom MRN: [id number] DOB: 10-16-1941    Due to family and patient wishes at this time , and Dr. Towanda guidance, PT and OT will sign off .  Thank you,   MILLICENT SALES 06/23/2024, 1:57 PM SALES, PT, MPT Acute Rehabilitation Services Office: [phone] If a weekend: secure chat groups: [facility] PT, [facility] OT, [facility] SLP 06/23/2024

## 2024-06-23 NOTE — Progress Notes (Signed)
 OT Cancellation Note  Patient Details Name: Elizabeth Odom MRN: [id number] DOB: 1941-02-01   Cancelled Treatment:    Reason Eval/Treat Not Completed: Other (comment) (signing off for OT due to patient and family wishes)  And per MD guidance, OT signing off at this time.   Ivo Moga OT/L Acute Rehabilitation Department  [phone]    06/23/2024, 3:03 PM

## 2024-06-23 NOTE — Progress Notes (Signed)
 I was able to call and reach the patient's granddaughter, Elizabeth Odom.  I updated on the patient's situation and numerous struggles.  I am worried about hitting a point of futility where she is constantly suffering and not rallying.  It has been sometime in IV nutrition and that has not improved.  At the most she is maybe able been able to get up to a chair two-person assist but that has not happened lately.  I recommend discussion about comfort care/hospice options.  I gave a chance for the patient's granddaughter to give her perspective and concerns.  She is concerned that her grandmother the patient has not made any significant recovery in a while.  The patient's granddaughter has been trying to reach out to her aunts and uncles to be more involved in the patient's care as well.  See what palliative care, internal medicine, therapists, case manager/social work think.

## 2024-06-23 NOTE — Progress Notes (Signed)
 06/23/2024  Elizabeth Odom [id number] 02-22-1941  CARE TEAM: PCP: Jesus Bernardino MATSU, MD  Outpatient Care Team: Patient Care Team: Jesus Bernardino MATSU, MD as PCP - General (Internal Medicine) [name], MD as Consulting Physician (Gastroenterology) [name], MD (Neurology) [name], MD as Consulting Physician (Urology) [name], MD as Consulting Physician (Cardiology) [name], MD as Consulting Physician (Colon and Rectal Surgery)  Inpatient Treatment Team: Treatment Team:  [name], MD [name], RN [name], MD [name], MD [name], MD [name], MD [name], MD Joannie Leader, NT Leron Riis, [place] Cesario Mylinda KIDD, RN Heyward Rosaline CROME, LPN Celestia Darice BROCKS, PTA Kriste Asberry BRAVO, RN   Problem List:   Principal Problem:   Diverticular disease Active Problems:   A-fib [facility])   Restless leg   ABLA (acute blood loss anemia)   Need for emotional support   Acute urinary retention   Mixed hyperlipidemia   Essential hypertension   Diverticular disease of left colon   History of DVT (deep vein thrombosis)   History of pulmonary embolus (PE)   Pre-diabetes   Presence of IVC filter   Stricture of sigmoid colon (HCC)   Protein-calorie malnutrition, severe   Hypokalemia   S/P percutaneous endoscopic gastrostomy (PEG) tube placement [facility])   Goals of care, counseling/discussion   Shock, during or resulting from a surgical procedure   05/08/2024  POST-OPERATIVE DIAGNOSIS:   COLOSTOMY FOR RESECTION, DESIRE FOR OSTOMY TAKEDOWN RECTAL STRICTURE HISTORY OF DIVERTICULITIS WITH PERFORATION & ABSCESS   PROCEDURE:   -ROBOTIC RECTOSIGMOID RESECTION (LAR) -TAKEDOWN OF END COLOSTOMY WITH ANASTOMOSIS -RESECTION OF SMALL INTESTINE WITH ANASTOMOSIS -SMALL BOWEL REPAIR -LYSIS OF ADHESIONS x 115 MINUTES (66% OF CASE),  -INTRAOPERATIVE ASSESSMENT OF TISSUE VASCULAR PERFUSION USING ICG (indocyanine  green) IMMUNOFLUORESCENCE,  -TRANSVERSUS ABDOMINIS PLANE (TAP) BLOCK - BILATERAL -FLEXIBLE SIGMOIDOSCOPY   SURGEON:  [name], MD   05/20/2024  Post procedural Dx: Post op abscess, multiple   Technically successful CT guided placed of a  10 Fr drainage catheter placement x 2  into the midline ventral and dorsal perirectal abscesses.     [name] LABOR, MD    05/28/2024  IMPRESSION: Satisfactory removal of the midline anterior abscess drainage catheter and placement of a new midline anterior pelvic drainage catheter as well as a posterior transgluteal drainage catheter in pelvic abscesses.     Electronically Signed   By: [name]    06/03/2024  POST-OPERATIVE DIAGNOSIS:   DELAYED COLORECTAL ANASTOMOTIC LEAK, NEED FOR FECAL DIVERSION CHRONIC PELVIC ABSCESS   PROCEDURE:   DIAGNOSTIC LAPAROSCOPY CONVERTED TO LAPAROTOMY DIVERTING LOOP TRANSVERSE COLOSTOMY DRAINAGE OF ABDOMINAL ABSCESS GASTROSTOMY TUBE PLACEMENT RECTAL TUBE PLACEMENT   SURGEON:  [name], MD   OR FINDINGS: Very dense concrete intraperitoneal and pelvic adhesions infraumbilically.  Bladder, small bowel, omentum walling off the lower pelvis with delayed anastomotic leak.   Near disruption of colorectal anastomosis by transanal examination.  Rectal tube placed through rectal stump to drain the pelvis more directly.   Diverting mid-transverse loop colostomy done.   Removal of old percutaneous abdominal and transgluteal drains.     Assessment [facility] Stay = 46 days) 20 Days Post-Op    Failure to thrive with delayed anastomotic leaks     Plan:  Delayed anastomotic leaks   Delayed colorectal anastomotic leak initially controlled with bowel rest and drains since patient declined colostomy diversion.    Then drain fell out & felt  worse despite drain replacement.    Acquiesced to diverting loop transverse colostomy (lower abdomen extremely fixed).  6/25 with very fixed  abdomen.    Transanal diarrhea started 7/2.    Concern by transanal diarrhea and follow-up CAT scan by my view that her ileal SBR anastomosis is leaking into the pelvis c/w internal ileorectal fistula.  Transanal diarrhea.   Flexi-Seal ordered by me 7/3 to help divert and protect the skin.    Volume tapering off with antidiarrheals.  DTO (deodorized tincture of opium ) 4 times daily.  Sandostatin  usually does not work for small bowel fistula but will try that just in case  Flexi-Seal removed 7/9 and switch to external catchment device.    Low vol liquids for palliation, but leave gastrostomy tube to gravity.  Full TPN  Severe malnutrition -albumin  still less than 2 despite TPN for several weeks.  Continue for now  Infection:  Antibiotic coverage per infectious disease with daptomycin  & meropenem .    Recent left upper quadrant fluid collection drained 7/7.  Seems consistent with seroma/old hematoma.  Nothing on cultures yet.  Follow.  Right pleural effusion tapped and negative for any infection.  Follow-up  Midline wound superficial enough to do just packing for now   Some recurrent atrial fibrillation and rapid rate.    PO/Gtube metoprolol .  Holding amlodipine  so that tamsulosin  can be given to avoid recurrent urinary retention.    Rate seems controlled with metoprolol  only.  Blood pressure more soft today, so I cut back on metoprolol  but see what medicine thinks.  Defer to medicine/cardiology  Acute blood loss anemia in the setting of anemia of chronic disease.    Transfused 6/26, 7/2.    Suspect she had a rectus sheath hematoma bleeding on heparin .  That is held.    Hemoglobin more stable currently reassuring.    I would be very hesitant to restart full anticoagulation w heparin  ever again given her numerous severe bleeding episodes  -despite her history of PE/DVT (has IVC filter as a result of prior bleeding problems) and chronic A-fib.   -Duplex 7/7 probable chronic  DVT on left (she complains of symptoms on right) but nothing acute  Can consider prophylactic heparin  or enoxaparin  if hemoglobin stable and no procedures for 48 hours.  See what internal medicine thinks.  No easy solutions here  No evidence of coagulopathy or thrombocytopenia.  Of note, she had a prior workup and follow-up that did not show any coagulopathy by Dr. Lanny with hematology.  May reconsult if persistent oozing/bleeding  IV iron  q week.   C/o Leg & back itching -no complaints this week.  -No evid of rash nor weakness.    -Topical steroids.    -H2 blockage - Atarax .  Lowered the dose and switch to PRN (as needed) to avoid her being as sedated.      -would like to hold off on IV steroids given her delayed anastomotic leaks, severe malnutrition, chronic infections  History of urinary retention.   Prior need for Foley catheter replacement but later removed.    With transanal diarrhea Foley catheter replaced to help clear the skin that was getting rather raw  -Foley catheter removed 7/8   switch to pure wick to avoid internal catheter   Restart tamsulosin  given some history of urine retention.  Hold amlodipine  as a compromise and follow heart rate  -monitor electrolytes & replace as needed.  Keep K>4, Mg>2, Phos>3.  Replace phosphorus.  Pharmacy following and most likely  will adjust TPN as well   -Pain control.  A constant challenge since she is either very sleepy and sedated or complaining of severe pain. -Scheduled Tylenol .   -Gradually increase gabapentin   -was sedated so backed off on oxycodone  to just 5 mg nightly.  Can have that PRN (as needed).  I think patient likes Dilaudid  but it makes her sleepy.  Use that as a last resort.  -mobilize as tolerated to help recovery.  Try and get therapists involved to help her mobilize more.  Patient continues to decline any therapy even though she tells me she will help.  Biggest challenge.    I updated the patient's status to  the patient and nurse.  Called discussed with the patient's granddaughter who closely follows her 170/1.  Recommendations were made.  Questions were answered.  They expressed understanding & appreciation.  -Disposition: I worry we have hit a point of futility.  Her nutrition has not improved.  She will not interact with therapies.  She struggling with suffering.  Her transanal diarrhea persists.  I had a heart-to-heart with her this morning that I am worried I have done what I can surgically.  Will see what medicine and palliative care and other specialties think but I worry that she is just continually suffering.  Will reach out to granddaughter who is back from vacation as well.        I reviewed last 24 h vitals and pain scores, last 48 h intake and output, last 24 h labs and trends, and last 24 h imaging results.  I have reviewed this patient's available data, including medical history, events of note, test results, etc as part of my evaluation.   A significant portion of that time was spent in counseling. Care during the described time interval was provided by me.  This care required moderate level of medical decision making.  06/23/2024    Subjective: (Chief complaint)  Patient with persistent diarrhea and incontinence.  Patient worried about suffering but worried about not getting better.  Visited by granddaughter yesterday.  Still complains of itching and pain.  Some nausea.  Medicines partially help.  Will interact with therapies for some time.  Will get out of bed.      Objective:  Vital signs:  Vitals:   06/22/24 1745 06/22/24 2100 06/22/24 2230 06/23/24 0504  BP: (!) 141/72 137/84 126/76 (!) 145/81  Pulse: 87 87 87 92  Resp: 16  17 16   Temp: (!) 97.4 F (36.3 C)  98.1 F (36.7 C) 97.8 F (36.6 C)  TempSrc: Oral     SpO2: 96%  95% 95%  Weight:      Height:        Last BM Date : 06/22/24  Intake/Output   Yesterday:  07/13 0701 - 07/14 0700 In:  4499.2 [P.O.:685; I.V.:2602.6; IV Piggyback:991.6] Out: 1235 [Urine:550; Drains:585; Stool:100] This shift:  No intake/output data recorded.   Bowel function:  Flatus: YES  BM:  YES -scant stool and flatus in colostomy bag which is better than the past few days   Drain:  Left upper quadrant percutaneous tube 7/7 thinly serosanguineous  Gastrostomy tube 6/24 with thin bilious effluent and gravity bag    Physical Exam:  General: Sleeping but awakens.  Tearful.  Somewhat mumbling.  Asks appropriate questions.  Not toxic but uncomfortable.          Eyes: PERRL, normal EOM.  Sclera clear.  No icterus Neuro: CN II-XII intact w/o focal sensory/motor  deficits.  No facial droop. Lymph: No head/neck/groin lymphadenopathy Psych:  No delerium/psychosis/paranoia.  Oriented x 4 HENT: Normocephalic, Mucus membranes moist.  No thrush.  Mild HOH Neck: Supple, No tracheal deviation.  No obvious thyromegaly.  Chest: No pain to chest wall compression.  Good respiratory excursion.  Mild conversational dyspnea and tachypnea.  No audible wheezing.   CV:  Pulses intact.  Mostly regular rhythm.  No major extremity edema MS: Normal AROM mjr joints.  No obvious deformity  Abdomen:  Most of abdomen with firmness consistent with rectus sheath abdominal wall hematoma.  Right side of abdomen softer.  Moderately distended.  Mildly tender at incisions only.  No guarding.   Right upper quadrant mid transverse diverting loop colostomy pink with edema.  Scant stool  Midline incision with some granulation. superificial Gastrostomy tube left upper quadrant without any leaking   GU: Pure wick in place with clear urine   Rectal: Scant thin diarrhea.  Pruritus Ext:   No deformity.  No mjr edema.  No cyanosis Skin: No petechiae / purpurea.  No major sores.  Warm and dry    Results:   Cultures: Recent Results (from the past 720 hours)  Aerobic/Anaerobic Culture w Gram Stain (surgical/deep wound)     Status: None    Collection Time: 05/27/24  5:19 PM   Specimen: Abscess  Result Value Ref Range Status   Specimen Description   Final    ABSCESS ABDOMEN Performed at [facility], [address] [place], [place] [address]    Special Requests   Final    NONE Performed at [facility], [address] [place], [place] [address]    Gram Stain   Final    ABUNDANT WBC PRESENT, PREDOMINANTLY PMN NO ORGANISMS SEEN    Culture   Final    RARE PSEUDOMONAS AERUGINOSA RARE CANDIDA ALBICANS NO ANAEROBES ISOLATED Performed at [facility], [address], [place], [place] [address]    Report Status 06/01/2024 FINAL  Final   Organism ID, Bacteria PSEUDOMONAS AERUGINOSA  Final      Susceptibility   Pseudomonas aeruginosa - MIC*    CEFTAZIDIME 4 SENSITIVE Sensitive     CIPROFLOXACIN <=0.25 SENSITIVE Sensitive     GENTAMICIN 4 SENSITIVE Sensitive     IMIPENEM 2 SENSITIVE Sensitive     PIP/TAZO <=4 SENSITIVE Sensitive ug/mL    CEFEPIME 2 SENSITIVE Sensitive     * RARE PSEUDOMONAS AERUGINOSA  Surgical pcr screen     Status: Abnormal   Collection Time: 06/02/24  7:55 PM   Specimen: Nasal Mucosa; Nasal Swab  Result Value Ref Range Status   MRSA, PCR POSITIVE (A) NEGATIVE Final   Staphylococcus aureus POSITIVE (A) NEGATIVE Final    Comment: (NOTE) The Xpert SA Assay (FDA approved for NASAL specimens in patients 83 years of age and older), is one component of a comprehensive surveillance program. It is not intended to diagnose infection nor to guide or monitor treatment. Performed at [facility], [address], [place], [place] [address]   Aerobic/Anaerobic Culture w Gram Stain (surgical/deep wound)     Status: None   Collection Time: 06/11/24  4:12 PM   Specimen: Abdomen; Abdominal Fluid  Result Value Ref Range Status   Specimen Description   Final    ABDOMEN Performed at [facility], [address]  [place], [place] [address]    Special Requests   Final    ABDOMINAL FLUID Performed  at [facility], [address] [place], [place] [address]    Gram Stain   Final    ABUNDANT WBC PRESENT, PREDOMINANTLY PMN ABUNDANT GRAM NEGATIVE RODS FEW GRAM POSITIVE COCCI    Culture   Final    MODERATE PSEUDOMONAS AERUGINOSA Two isolates with different morphologies were identified as the same organism.The most resistant organism was reported. FEW METHICILLIN RESISTANT STAPHYLOCOCCUS AUREUS MODERATE ENTEROCOCCUS FAECALIS MODERATE CLOSTRIDIUM INNOCUUM Standardized susceptibility testing for this organism is not available. Performed at [facility], [address], [place], [place] [address]    Report Status 06/16/2024 FINAL  Final   Organism ID, Bacteria PSEUDOMONAS AERUGINOSA  Final   Organism ID, Bacteria ENTEROCOCCUS FAECALIS  Final   Organism ID, Bacteria METHICILLIN RESISTANT STAPHYLOCOCCUS AUREUS  Final      Susceptibility   Enterococcus faecalis - MIC*    AMPICILLIN <=2 SENSITIVE Sensitive     VANCOMYCIN  1 SENSITIVE Sensitive     GENTAMICIN SYNERGY SENSITIVE Sensitive     * MODERATE ENTEROCOCCUS FAECALIS   Methicillin resistant staphylococcus aureus - MIC*    CIPROFLOXACIN >=8 RESISTANT Resistant     ERYTHROMYCIN  >=8 RESISTANT Resistant     GENTAMICIN <=0.5 SENSITIVE Sensitive     OXACILLIN >=4 RESISTANT Resistant     TETRACYCLINE <=1 SENSITIVE Sensitive     VANCOMYCIN  1 SENSITIVE Sensitive     TRIMETH/SULFA >=320 RESISTANT Resistant     CLINDAMYCIN <=0.25 SENSITIVE Sensitive     RIFAMPIN <=0.5 SENSITIVE Sensitive     Inducible Clindamycin NEGATIVE Sensitive     LINEZOLID 2 SENSITIVE Sensitive     * FEW METHICILLIN RESISTANT STAPHYLOCOCCUS AUREUS   Pseudomonas aeruginosa - MIC*    CEFTAZIDIME >=64 RESISTANT Resistant     CIPROFLOXACIN 0.5 SENSITIVE Sensitive     GENTAMICIN 4 SENSITIVE Sensitive     IMIPENEM 2 SENSITIVE Sensitive     * MODERATE PSEUDOMONAS  AERUGINOSA  Aerobic/Anaerobic Culture w Gram Stain (surgical/deep wound)     Status: None   Collection Time: 06/16/24  1:08 PM   Specimen: Abscess  Result Value Ref Range Status   Specimen Description   Final    ABSCESS ABDOMEN Performed at [facility], [address], [place], [place] [address]    Special Requests   Final    NONE Performed at [facility], [address] [place], [place] [address]    Gram Stain NO WBC SEEN NO ORGANISMS SEEN   Final   Culture   Final    No growth aerobically or anaerobically. Performed at [facility], [address], [place], [place] [address]    Report Status 06/21/2024 FINAL  Final  Fungus Culture With Stain     Status: None (Preliminary result)   Collection Time: 06/19/24  3:22 PM   Specimen: PATH Cytology Pleural fluid  Result Value Ref Range Status   Fungus Stain Final report  Final    Comment: (NOTE) Performed At: [facility] [address] [place], [place] [id number] Jennette Shorter MD Ey:[id number]    Fungus (Mycology) Culture PENDING  Incomplete   Fungal Source PLEURAL  Final    Comment: Performed at [facility], [address] [place], [place] [address]  Culture, Fungus without Smear     Status: None (Preliminary result)   Collection Time: 06/19/24  3:22 PM   Specimen: PATH Cytology Pleural fluid  Result Value Ref Range Status   Specimen Description   Final  PLEURAL CYTO Performed at [facility], [address], [place], [place] [address]    Special Requests   Final    NONE Performed at [facility], [address] [place], [place] [address]    Culture   Final    NO FUNGUS ISOLATED AFTER 3 DAYS Performed at [facility], [address], [place], [place] [address]    Report Status PENDING  Incomplete  Acid Fast Smear (AFB)     Status: None   Collection Time: 06/19/24  3:22 PM   Specimen: PATH  Cytology Pleural fluid  Result Value Ref Range Status   AFB Specimen Processing Concentration  Final   Acid Fast Smear Negative  Final    Comment: (NOTE) Performed At: [facility] [address] [place], [place] [id number] Jennette Shorter MD Ey:[id number]    Source (AFB) PLEURAL  Final    Comment: Performed at [facility], [address], [place], [place] [address]  Body fluid culture w Gram Stain     Status: None   Collection Time: 06/19/24  3:22 PM   Specimen: PATH Cytology Pleural fluid  Result Value Ref Range Status   Specimen Description   Final    PLEURAL CYTO Performed at [facility], [address], [place], [place] [address]    Special Requests   Final    NONE Performed at [facility], [address] [place], [place] [address]    Gram Stain   Final    FEW WBC PRESENT, PREDOMINANTLY PMN NO ORGANISMS SEEN    Culture   Final    NO GROWTH 3 DAYS Performed at [facility], [address] [place], [place] [address]    Report Status 06/22/2024 FINAL  Final  Fungus Culture Result     Status: None   Collection Time: 06/19/24  3:22 PM  Result Value Ref Range Status   Result 1 Comment  Final    Comment: (NOTE) KOH/Calcofluor preparation:  no fungus observed. Performed At: [facility] [address] [place], [place] [id number] Jennette Shorter MD Ey:[id number]     Labs: Results for orders placed or performed during the hospital encounter of 05/08/24 (from the past 48 hours)  Basic metabolic panel with GFR     Status: Abnormal   Collection Time: 06/22/24  3:27 AM  Result Value Ref Range   Sodium 134 (L) 135 - 145 mmol/L   Potassium 4.2 3.5 - 5.1 mmol/L   Chloride 105 98 - 111 mmol/L   CO2 23 22 - 32 mmol/L   Glucose, Bld 109 (H) 70 - 99 mg/dL    Comment: Glucose reference range applies only to samples taken after fasting for at least 8 hours.   BUN 27 (H) 8 - 23 mg/dL   Creatinine,  Ser 9.58 (L) 0.44 - 1.00 mg/dL   Calcium  8.1 (L) 8.9 - 10.3 mg/dL   GFR, Estimated >39 >39 mL/min    Comment: (NOTE) Calculated using the CKD-EPI Creatinine Equation (2021)    Anion gap 6 5 - 15    Comment: Performed at [facility], [address] [place], [place] [address]  Magnesium      Status: None   Collection Time: 06/22/24  3:27 AM  Result Value Ref Range   Magnesium  2.0 1.7 - 2.4 mg/dL    Comment: Performed at [facility], [address], [place], [place] [address]  Phosphorus  Status: None   Collection Time: 06/22/24  3:27 AM  Result Value Ref Range   Phosphorus 3.1 2.5 - 4.6 mg/dL    Comment: Performed at [facility], [address], [place], [place] [address]  CK     Status: Abnormal   Collection Time: 06/23/24  2:32 AM  Result Value Ref Range   Total CK 31 (L) 38 - 234 U/L    Comment: Performed at [facility], [address], [place], [place] [address]  Comprehensive metabolic panel     Status: Abnormal   Collection Time: 06/23/24  2:32 AM  Result Value Ref Range   Sodium 132 (L) 135 - 145 mmol/L   Potassium 4.0 3.5 - 5.1 mmol/L   Chloride 103 98 - 111 mmol/L   CO2 21 (L) 22 - 32 mmol/L   Glucose, Bld 121 (H) 70 - 99 mg/dL    Comment: Glucose reference range applies only to samples taken after fasting for at least 8 hours.   BUN 23 8 - 23 mg/dL   Creatinine, Ser <9.69 (L) 0.44 - 1.00 mg/dL   Calcium  8.0 (L) 8.9 - 10.3 mg/dL   Total Protein 6.7 6.5 - 8.1 g/dL   Albumin  1.8 (L) 3.5 - 5.0 g/dL   AST 55 (H) 15 - 41 U/L   ALT 47 (H) 0 - 44 U/L   Alkaline Phosphatase 278 (H) 38 - 126 U/L   Total Bilirubin 1.7 (H) 0.0 - 1.2 mg/dL   GFR, Estimated NOT CALCULATED >60 mL/min    Comment: (NOTE) Calculated using the CKD-EPI Creatinine Equation (2021)    Anion gap 8 5 - 15    Comment: Performed at [facility], [address] [place], [place] [address]  Magnesium       Status: None   Collection Time: 06/23/24  2:32 AM  Result Value Ref Range   Magnesium  1.9 1.7 - 2.4 mg/dL    Comment: Performed at [facility], [address] [place], [place] [address]  Phosphorus     Status: None   Collection Time: 06/23/24  2:32 AM  Result Value Ref Range   Phosphorus 2.8 2.5 - 4.6 mg/dL    Comment: Performed at [facility], [address], [place], [place] [address]  Triglycerides     Status: Abnormal   Collection Time: 06/23/24  2:32 AM  Result Value Ref Range   Triglycerides 194 (H) <150 mg/dL    Comment: Performed at [facility], [address], [place], [place] [address]    Imaging / Studies: No results found.        Medications / Allergies: per chart  Antibiotics: Anti-infectives (From admission, onward)    Start     Dose/Rate Route Frequency Ordered Stop   06/15/24 1600  meropenem  (MERREM ) 1 g in sodium chloride  0.9 % 100 mL IVPB        1 g 200 mL/hr over 30 Minutes Intravenous Every 8 hours 06/15/24 1510     06/15/24 1600  DAPTOmycin  (CUBICIN ) IVPB 700 mg/100mL premix        700 mg 200 mL/hr over 30 Minutes Intravenous Daily 06/15/24 1510     06/11/24 1545  piperacillin -tazobactam (ZOSYN ) IVPB 3.375 g  Status:  Discontinued        3.375 g 12.5 mL/hr over 240 Minutes Intravenous Every 8 hours 06/11/24 1457 06/15/24 1510   06/09/24 1745  DAPTOmycin  (CUBICIN ) IVPB 500 mg/50mL premix  Status:  Discontinued        500 mg 100 mL/hr over 30 Minutes Intravenous Daily 06/09/24 1647 06/13/24 1539   06/06/24 1000  micafungin  (MYCAMINE ) 100 mg in sodium chloride  0.9 % 100 mL IVPB  Status:  Discontinued        100 mg 105 mL/hr over 1 Hours Intravenous Every 24 hours 06/05/24 1349 06/12/24 1142   06/04/24 1400  piperacillin -tazobactam (ZOSYN ) IVPB 3.375 g        3.375 g 12.5 mL/hr over 240 Minutes Intravenous Every 8 hours 06/04/24 0738 06/09/24 2124   06/04/24 1400  DAPTOmycin  (CUBICIN ) IVPB  500 mg/16mL premix        8 mg/kg  60.9 kg 100 mL/hr over 30 Minutes Intravenous Daily 06/04/24 0738 06/10/24 0126   06/03/24 1000  neomycin  (MYCIFRADIN ) tablet 500 mg  Status:  Discontinued        500 mg Oral 3 times daily 06/03/24 0935 06/03/24 1555   06/03/24 1000  metroNIDAZOLE  (FLAGYL ) tablet 500 mg  Status:  Discontinued       Note to Pharmacy: Take 2 pills (=1000mg ) by mouth at 1pm, 3pm, and 10pm the day before your colorectal operation   500 mg Oral 3 times daily 06/03/24 0935 06/03/24 1555   05/29/24 1500  DAPTOmycin  (CUBICIN ) IVPB 500 mg/50mL premix  Status:  Discontinued        8 mg/kg  60.9 kg 100 mL/hr over 30 Minutes Intravenous Daily 05/29/24 1359 06/04/24 0738   05/28/24 2100  vancomycin  (VANCOREADY) IVPB 750 mg/150 mL  Status:  Discontinued        750 mg 150 mL/hr over 60 Minutes Intravenous Every 12 hours 05/28/24 1419 05/29/24 1359   05/27/24 0800  vancomycin  (VANCOCIN ) IVPB 1000 mg/200 mL premix  Status:  Discontinued        1,000 mg 200 mL/hr over 60 Minutes Intravenous Every 24 hours 05/26/24 1144 05/28/24 1419   05/23/24 1400  piperacillin -tazobactam (ZOSYN ) IVPB 3.375 g  Status:  Discontinued        3.375 g 12.5 mL/hr over 240 Minutes Intravenous Every 8 hours 05/23/24 0804 06/04/24 0738   05/23/24 1200  micafungin  (MYCAMINE ) 100 mg in sodium chloride  0.9 % 100 mL IVPB        100 mg 105 mL/hr over 1 Hours Intravenous Every 24 hours 05/23/24 0755 06/05/24 1221   05/22/24 0800  vancomycin  (VANCOCIN ) IVPB 1000 mg/200 mL premix  Status:  Discontinued        1,000 mg 200 mL/hr over 60 Minutes Intravenous Every 24 hours 05/21/24 0634 05/26/24 0806   05/21/24 0730  vancomycin  (VANCOREADY) IVPB 1250 mg/250 mL        1,250 mg 166.7 mL/hr over 90 Minutes Intravenous  Once 05/21/24 0634 05/21/24 1043   05/19/24 1400  piperacillin -tazobactam (ZOSYN ) IVPB 3.375 g  Status:  Discontinued        3.375 g 12.5 mL/hr over 240 Minutes Intravenous Every 8 hours 05/19/24 1303  05/23/24 0804   05/09/24 0900  erythromycin  250 mg in sodium chloride  0.9 % 100 mL IVPB        250 mg 100 mL/hr over 60 Minutes Intravenous Every 8 hours 05/09/24 0732 05/11/24 0044   05/08/24 2200  cefoTEtan  (CEFOTAN ) 2 g in sodium chloride  0.9 % 100 mL IVPB        2 g 200 mL/hr over 30 Minutes Intravenous Every 12 hours 05/08/24 1759 05/09/24 0749   05/08/24 1400  neomycin  (MYCIFRADIN ) tablet 1,000 mg  Status:  Discontinued  Placed in And Linked Group   1,000 mg Oral 3 times per day 05/08/24 1119 05/08/24 1120   05/08/24 1400  metroNIDAZOLE  (FLAGYL ) tablet 1,000 mg  Status:  Discontinued       Placed in And Linked Group   1,000 mg Oral 3 times per day 05/08/24 1119 05/08/24 1120   05/08/24 1130  cefoTEtan  (CEFOTAN ) 2 g in sodium chloride  0.9 % 100 mL IVPB        2 g 200 mL/hr over 30 Minutes Intravenous On call to O.R. 05/08/24 1119 05/09/24 0749         Note: Portions of this report may have been transcribed using voice recognition software. Every effort was made to ensure accuracy; however, inadvertent computerized transcription errors may be present.   Any transcriptional errors that result from this process are unintentional.    Elspeth KYM Schultze, MD, FACS, MASCRS Esophageal, Gastrointestinal & Colorectal Surgery Robotic and Minimally Invasive Surgery  [facility] [address] [place], [place] [address] [phone] Fax [phone] Main  CONTACT INFORMATION: Weekday (9AM-5PM): Call [facility] main office at [phone] Weeknight (5PM-9AM) or Weekend/Holiday: Check EPIC Web Links tab & use AMION (password  [email or web address]) for General Surgery [facility] coverage  Please, DO NOT use SecureChat  (it is not reliable communication to reach operating surgeons & will lead to a delay in care).   Epic staff messaging available for outptient concerns needing 1-2 business day response.      06/23/2024  7:58 AM

## 2024-06-23 NOTE — Progress Notes (Signed)
 Id brief note    Palliative care had discussed with family. It appears family had chosen to go on comfort care which is reasonable in this complex unfortunate case  -can stop iv abx -discussed with primary team/palliative/triad

## 2024-06-23 NOTE — Consult Note (Signed)
 WOC Nurse ostomy follow-up consult note   Pt did not watch or participate in ostomy pouch change. She will need total assistance with pouch application and emptying after discharge.  No family members at the bedside.  Stoma type/location: Stoma is red and viable, 1 1/4 inches, flush with skin level, 50 cc brown liquid in the pouch. Applied barrier ring and one piece flexible convex pouch. 3 sets of supplies at the bedside for staff nurse's use: use barrier ring, Lawson # [phone] and one piece convex pouch Lawson # [phone].  Enrolled patient in [employer] DC program: Yes, previously WOC team will continue to follow. Thank-you,  Stephane Fought MSN, RN, CWOCN, [place], CNS [phone]

## 2024-06-23 NOTE — Progress Notes (Signed)
  Daily Progress Note   Patient Name: KILA GODINA       Date: 06/23/2024 DOB: Feb 14, 1941  Age: 83 y.o. MRN#: [id number] Attending Physician: [name], MD Primary Care Physician: Jesus Bernardino MATSU, MD Admit Date: 05/08/2024 Length of Stay: 46 days  Reason for Consultation/Follow-up: Establishing goals of care  Subjective:   Reviewed recent EMR documentation as well as surgery documentation  Patient resting in bed, appears chronically ill, at times, she is also exhibiting non-verbal gestures of distress/discomfort. Help me Vital Signs:  BP 109/89   Pulse (!) 111   Temp 97.8 F (36.6 C)   Resp 16   Ht 5' 5 (1.651 m)   Wt 69.6 kg   SpO2 95%   BMI 25.53 kg/m   Physical Exam: General:resting comfortably cachectic, frail Cardiovascular: RRR Respiratory: no increased work of breathing noted, not in respiratory distress    Assessment & Plan:   Assessment: Patient is an 83 year old female with a past medical history of DVT, PE, atrial fibrillation, hypertension, mild cognitive impairment, and large bowel obstruction status post colonic resection and colostomy creation in 2020 for which has been complicated by anastomotic leak.  Patient presented last month for colostomy resection and takedown but had complicated postop course with dense adhesions, A-fib with RVR, intra-abdominal abscess, and poor oral intake requiring support of TPN and PEG placement.  Palliative medicine team consulted to assist with complex medical decision making.  Recommendations/Plan: # Complex medical decision making/goals of care:  - Surgery note reviewed, call placed and discussed with HCPOA.  DNR DNI Comfort care Residential hospice evaluation Liberalize dose of IV Dilaudid  PRN Ok to continue TPN until hospice eval, discussed with grand daughter that at hospice house, comfort feeds such as clear to full liquids will be given on an as needed basis and that no artificial nutrition is provided.   -   Code Status: Do not attempt resuscitation (DNR) - Comfort care    Discussed extensively with grand daughter HCPOA.  # Symptom management:  Medication history noted.  Also on Atarax  PRN for itching.   general surgery recommendations noted.  # Psychosocial support  - Already consulted chaplain to assist with spiritual support. Appreciate spiritual care. Grand daughter only has her husband for support, she is the patient's HCPOA. Patient has 3 kids, grand daughter to inform them about the patient's current condition, her ongoing decline and that the plan now is for DNR DNI comfort care and hospice evaluation.   # Discharge Planning:  [facility] consult for residential hospice evaluation.   Thank you for allowing the palliative care team to participate in the care Onika M [place].  High MDM Lonia Serve MD.  Palliative Care Provider PMT # [phone]  If patient remains symptomatic despite maximum doses, please call PMT at [phone] between 0700 and 1900. Outside of these hours, please call attending, as PMT does not have night coverage.

## 2024-06-24 DIAGNOSIS — K5790 Diverticulosis of intestine, part unspecified, without perforation or abscess without bleeding: Secondary | ICD-10-CM | POA: Diagnosis not present

## 2024-06-24 LAB — BASIC METABOLIC PANEL WITH GFR
Anion gap: 7 (ref 5–15)
BUN: 24 mg/dL — ABNORMAL HIGH (ref 8–23)
CO2: 24 mmol/L (ref 22–32)
Calcium: 8.2 mg/dL — ABNORMAL LOW (ref 8.9–10.3)
Chloride: 100 mmol/L (ref 98–111)
Creatinine, Ser: 0.36 mg/dL — ABNORMAL LOW (ref 0.44–1.00)
GFR, Estimated: >60 mL/min (ref >60)
Glucose, Bld: 155 mg/dL — ABNORMAL HIGH (ref 70–99)
Potassium: 4.1 mmol/L (ref 3.5–5.1)
Sodium: 131 mmol/L — ABNORMAL LOW (ref 135–145)

## 2024-06-24 LAB — CYTOLOGY - NON PAP

## 2024-06-24 LAB — MAGNESIUM: Magnesium: 2 mg/dL (ref 1.7–2.4)

## 2024-06-24 LAB — PHOSPHORUS: Phosphorus: 3.2 mg/dL (ref 2.5–4.6)

## 2024-06-24 MED ORDER — MORPHINE 100MG IN NS 100ML (1MG/ML) PREMIX INFUSION
2.5000 mg/h | INTRAVENOUS | Status: DC
  Administered 2024-06-24: 2 mg/h via INTRAVENOUS
  Filled 2024-06-24: qty 100

## 2024-06-24 MED ORDER — TRAVASOL 10 % IV SOLN
INTRAVENOUS | Status: DC
  Filled 2024-06-24: qty 480

## 2024-06-24 MED ORDER — MORPHINE BOLUS VIA INFUSION: 1.0000 mg | INTRAVENOUS | Status: DC | PRN

## 2024-06-24 NOTE — Plan of Care (Signed)
  Problem: Education: Goal: Verbalization of understanding of the causes of altered bowel function will improve Outcome: Progressing   Problem: Bowel/Gastric: Goal: Gastrointestinal status for postoperative course will improve Outcome: Progressing   Problem: Health Behavior/Discharge Planning: Goal: Identification of community resources to assist with postoperative recovery needs will improve Outcome: Progressing   Problem: Nutritional: Goal: Will attain and maintain optimal nutritional status will improve Outcome: Progressing   Problem: Clinical Measurements: Goal: Postoperative complications will be avoided or minimized Outcome: Progressing   Problem: Respiratory: Goal: Respiratory status will improve Outcome: Progressing   Problem: Skin Integrity: Goal: Will show signs of wound healing Outcome: Progressing   Problem: Education: Goal: Knowledge of General Education information will improve Description: Including pain rating scale, medication(s)/side effects and non-pharmacologic comfort measures Outcome: Progressing   Problem: Health Behavior/Discharge Planning: Goal: Ability to manage health-related needs will improve Outcome: Progressing   Problem: Clinical Measurements: Goal: Ability to maintain clinical measurements within normal limits will improve Outcome: Progressing Goal: Will remain free from infection Outcome: Progressing Goal: Diagnostic test results will improve Outcome: Progressing Goal: Respiratory complications will improve Outcome: Progressing Goal: Cardiovascular complication will be avoided Outcome: Progressing   Problem: Activity: Goal: Risk for activity intolerance will decrease Outcome: Progressing   Problem: Coping: Goal: Level of anxiety will decrease Outcome: Progressing   Problem: Elimination: Goal: Will not experience complications related to bowel motility Outcome: Progressing Goal: Will not experience complications related to  urinary retention Outcome: Progressing   Problem: Pain Managment: Goal: General experience of comfort will improve and/or be controlled Outcome: Progressing   Problem: Safety: Goal: Ability to remain free from injury will improve Outcome: Progressing   Problem: Skin Integrity: Goal: Risk for impaired skin integrity will decrease Outcome: Progressing   Problem: Education: Goal: Ability to describe self-care measures that may prevent or decrease complications (Diabetes Survival Skills Education) will improve Outcome: Progressing Goal: Individualized Educational Video(s) Outcome: Progressing   Problem: Coping: Goal: Ability to adjust to condition or change in health will improve Outcome: Progressing   Problem: Fluid Volume: Goal: Ability to maintain a balanced intake and output will improve Outcome: Progressing   Problem: Health Behavior/Discharge Planning: Goal: Ability to identify and utilize available resources and services will improve Outcome: Progressing Goal: Ability to manage health-related needs will improve Outcome: Progressing   Problem: Metabolic: Goal: Ability to maintain appropriate glucose levels will improve Outcome: Progressing   Problem: Nutritional: Goal: Maintenance of adequate nutrition will improve Outcome: Progressing Goal: Progress toward achieving an optimal weight will improve Outcome: Progressing   Problem: Skin Integrity: Goal: Risk for impaired skin integrity will decrease Outcome: Progressing   Problem: Tissue Perfusion: Goal: Adequacy of tissue perfusion will improve Outcome: Progressing   Problem: Activity: Goal: Ability to tolerate increased activity will improve Outcome: Progressing   Problem: Respiratory: Goal: Ability to maintain a clear airway and adequate ventilation will improve Outcome: Progressing   Problem: Role Relationship: Goal: Method of communication will improve Outcome: Progressing

## 2024-06-24 NOTE — Progress Notes (Signed)
 PHARMACY - TOTAL PARENTERAL NUTRITION CONSULT NOTE   Indication: Prolonged ileus  Patient Measurements: Height: 5' 5 (165.1 cm) Weight:  (Unable to weight pt due to zeroed bed) IBW/kg (Calculated) : 57 TPN AdjBW (KG): 61.3 Body mass index is 25.53 kg/m.  Assessment: 55 yoF with history of diverticulitis with perforation and abscess. 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 She requested ostomy takedown and underwent LOA, colostomy takedown, small bowel resection with anastomosis on 5/29. Pharmacy is consulted to dose TPN starting 6/2 for postop ileus.  Post-op course complicated by anastomotic leak, multiple-intraabdominal abscesses. Post-op bleeding. Patient is now s/p diverting loop transverse colostomy.  Patient now transitioned to comfort-focused measures; family would like to continue TPN however, until all visiting family have had arrived  Glucose / Insulin : no Hx DM, A1c 5.1%; CBG goal 100-150 - SBGs remain < 150 - Off SSI since 6/28 Electrolytes:  - Na remains borderline low; all other lytes stable WNL Renal: SCr stable WNL (<1.0); BUN slightly elevated; UOP adequate Hepatic: LFTs, Tbili, and TG all borderline elevated and relatively stable I/O: - Gastrostomy (venting): <200 ml/d - J-P Drain OP stable <100 ml/d - no mIVF - Colostomy OP improved to <100 ml/d; continues to have some rectal output ([facility] suspects d/t leakage into abdomen at ileal anastomosis, with resultant 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 Additional small loculated fluid collection small bowel mesentery.  - 7/1 CTa/p: Multiple complex fluid collections within the upper abdomen  - 7/5 CTa/p: No well-defined or clear enterorectal fistula. Persistent fluid collections again noted in the abdomen and pelvis, possibly from prior hemorrhage, unchanged from prior. Enlarging anterior pelvic fluid collection at prior drain site  GI Surgeries / Procedures: - 5/29: LAR, SBR, colostomy takedown, LOA - 6/10 drains placed x 2 in IR (midline abd, transgluteal) - 6/17: Replacement of drainage catheter by IR - 6/24: diverting loop transverse colostomy, I&D of abdominal abscess, gastrotomy tube placement, rectal tube placement.  - 7/3: CT guided visceral fluid drain by perc cath - 7/7: CT drainage of the left anterior abdominal fluid collection  Central access: Double lumen PICC placed 6/2 TPN start date: 6/2>7/1, resume 7/4>>  Nutritional Goals: Goal TPN rate is 70 mL/hr (provides 94 g of protein and 1680 kcals per day)  RD Assessment:  Estimated Needs Total Energy Estimated Needs: 1600-1750 kcals Total Protein Estimated Needs: 80-95 grams Total Fluid Estimated Needs: >/= 1.6L  Current Nutrition:  7/9 FLD, TPN at goal rate   Plan:  In the interest of minimizing labs for patient comfort, will reduce TPN rate (reducing risk of hypoglycemia if TPN interrupted), and remove components possibly contributing to elevated LFTs   At 1800, reduce TPN to 40 ml/hr Will remove lipids d/t rising triglycerides Remove trace elements given rising Tbili Electrolytes in TPN: continue max Na, adjust other components to initial values Na 154 mEq/L (max) K 50 mEq/L  Ca 5 mEq/L Mg 5 mEq/L  Phos 15 mmol/L  Cl:Ac 1:1 Continue MVI w/o  trace in TPN No further CBG checks with excellent control; trend BG on  routine Bmet MIVF per MD (none currently) Monitor TPN labs on Mon/Thurs, and as needed   Thank you for allowing pharmacy to be a part of this patient's care.  Bard Jeans, PharmD, BCPS [phone] 06/24/2024, 11:03 AM

## 2024-06-24 NOTE — Progress Notes (Signed)
 Noted patient has been transitioned to comfort care at this time.  Seen by palliative care.  Communicated with  Dr Sheldon, surgical team.  Will sign off at this time.

## 2024-06-24 NOTE — Progress Notes (Signed)
 06/24/2024  Elizabeth Odom [id number] Oct 04, 1941  CARE TEAM: PCP: Jesus Bernardino MATSU, MD  Outpatient Care Team: Patient Care Team: Jesus Bernardino MATSU, MD as PCP - General (Internal Medicine) [name], MD as Consulting Physician (Gastroenterology) [name], MD (Neurology) [name], MD as Consulting Physician (Urology) [name], MD as Consulting Physician (Cardiology) [name], MD as Consulting Physician (Colon and Rectal Surgery)  Inpatient Treatment Team: Treatment Team:  [name], MD [name], RN [name], MD [name], MD [name], MD [name], MD Joannie Leader, [place] Haig Damien HERO, RN Kriste Asberry BRAVO, RN Ellie Alfonse SAUNDERS, RN Mark Bard LABOR, [facility]   Problem List:   Principal Problem:   Diverticular disease Active Problems:   A-fib [facility])   Restless leg   ABLA (acute blood loss anemia)   Need for emotional support   Acute urinary retention   Mixed hyperlipidemia   Essential hypertension   Diverticular disease of left colon   History of DVT (deep vein thrombosis)   History of pulmonary embolus (PE)   Pre-diabetes   Presence of IVC filter   Stricture of sigmoid colon (HCC)   Protein-calorie malnutrition, severe   Hypokalemia   S/P percutaneous endoscopic gastrostomy (PEG) tube placement [facility])   Goals of care, counseling/discussion   Shock, during or resulting from a surgical procedure   Intra-abdominal abscess (HCC)   05/08/2024  POST-OPERATIVE DIAGNOSIS:   COLOSTOMY FOR RESECTION, DESIRE FOR OSTOMY TAKEDOWN RECTAL STRICTURE HISTORY OF DIVERTICULITIS WITH PERFORATION & ABSCESS   PROCEDURE:   -ROBOTIC RECTOSIGMOID RESECTION (LAR) -TAKEDOWN OF END COLOSTOMY WITH ANASTOMOSIS -RESECTION OF SMALL INTESTINE WITH ANASTOMOSIS -SMALL BOWEL REPAIR -LYSIS OF ADHESIONS x 115 MINUTES (66% OF CASE),  -INTRAOPERATIVE ASSESSMENT OF TISSUE VASCULAR PERFUSION USING ICG (indocyanine green )  IMMUNOFLUORESCENCE,  -TRANSVERSUS ABDOMINIS PLANE (TAP) BLOCK - BILATERAL -FLEXIBLE SIGMOIDOSCOPY   SURGEON:  [name], MD   05/20/2024  Post procedural Dx: Post op abscess, multiple   Technically successful CT guided placed of a  10 Fr drainage catheter placement x 2  into the midline ventral and dorsal perirectal abscesses.     [name] LABOR, MD    05/28/2024  IMPRESSION: Satisfactory removal of the midline anterior abscess drainage catheter and placement of a new midline anterior pelvic drainage catheter as well as a posterior transgluteal drainage catheter in pelvic abscesses.     Electronically Signed   By: [name]    06/03/2024  POST-OPERATIVE DIAGNOSIS:   DELAYED COLORECTAL ANASTOMOTIC LEAK, NEED FOR FECAL DIVERSION CHRONIC PELVIC ABSCESS   PROCEDURE:   DIAGNOSTIC LAPAROSCOPY CONVERTED TO LAPAROTOMY DIVERTING LOOP TRANSVERSE COLOSTOMY DRAINAGE OF ABDOMINAL ABSCESS GASTROSTOMY TUBE PLACEMENT RECTAL TUBE PLACEMENT   SURGEON:  [name], MD   OR FINDINGS: Very dense concrete intraperitoneal and pelvic adhesions infraumbilically.  Bladder, small bowel, omentum walling off the lower pelvis with delayed anastomotic leak.   Near disruption of colorectal anastomosis by transanal examination.  Rectal tube placed through rectal stump to drain the pelvis more directly.   Diverting mid-transverse loop colostomy done.   Removal of old percutaneous abdominal and transgluteal drains.     Assessment [facility] Stay = 47 days) 21 Days Post-Op    Failure to thrive with delayed anastomotic leaks     Plan:  Discussion made with patient and granddaughter yesterday.  Recommendation making DNR/DNI and more comfort care.  Met with Dr. Jeryl with palliative care who is working to help with that.  Discussed with charge nurse as well to make sure that patient and family have support.  Granddaughter told me she was going to try and reach out  with the patient's children to see if they can touch base since they have not been around.  Plan to keep on TPN and hydration for now until family visits are done.  Agree with wicks disease to stop all antibiotics.  No strong evidence of any recurrent infection on abdominal fluid collection drainage and right pleural drainage.  Defer to palliative care on pain and symptom control right now.  Reasonable to liberalize hydromorphone /Dilaudid  even if that is sedating  Most likely transition to [facility].     I reviewed last 24 h vitals and pain scores, last 48 h intake and output, last 24 h labs and trends, and last 24 h imaging results.  I have reviewed this patient's available data, including medical history, events of note, test results, etc as part of my evaluation.   A significant portion of that time was spent in counseling. Care during the described time interval was provided by me.  This care required moderate level of medical decision making.  06/24/2024    Subjective: (Chief complaint)  Numerous discussions.  Transition to palliative care.  Patient with some agitation but more comfortable this morning after receiving Dilaudid .        Objective:  Vital signs:  Vitals:   06/23/24 1057 06/23/24 1401 06/23/24 1402 06/23/24 2126  BP: 109/89  120/74 (!) 143/91  Pulse: (!) 111 82 86 81  Resp:   18 18  Temp:   97.6 F (36.4 C) 98 F (36.7 C)  TempSrc:   Axillary Axillary  SpO2:  97% 95% 94%  Weight:      Height:        Last BM Date : 06/22/24  Intake/Output   Yesterday:  07/14 0701 - 07/15 0700 In: 2459.1 [P.O.:380; I.V.:1404.1; IV Piggyback:555] Out: 1920 [Urine:1600; Drains:270; Stool:50] This shift:  Total I/O In: 60 [P.O.:60] Out: 100 [Urine:100]   Bowel function:  Flatus: YES  BM:  YES -scant stool and flatus in colostomy bag    Drain:  Left upper quadrant percutaneous tube 7/7 thinly serosanguineous  Gastrostomy tube 6/24 with thin  bilious effluent and gravity bag    Physical Exam:  General: Sleeping.  Looks more comfortable.     Eyes: PERRL, normal EOM.  Sclera clear.  No icterus Neuro: CN II-XII intact w/o focal sensory/motor deficits.  No facial droop. Lymph: No head/neck/groin lymphadenopathy Psych:  No delerium/psychosis/paranoia.  Oriented x 4 HENT: Normocephalic, Mucus membranes moist.  No thrush.  Mild HOH Neck: Supple, No tracheal deviation.  No obvious thyromegaly.  Chest: No pain to chest wall compression.  Good respiratory excursion.  Mild conversational dyspnea and tachypnea.  No audible wheezing.   CV:  Pulses intact.  Mostly regular rhythm.  No major extremity edema MS: Normal AROM mjr joints.  No obvious deformity  Abdomen:  Most of abdomen with firmness consistent with rectus sheath abdominal wall hematoma.  Right side of abdomen softer.  Moderately distended.  Mildly tender at incisions only.  No guarding.   Right upper quadrant mid transverse diverting loop colostomy pink with edema.  Scant stool  Midline incision with some granulation. superificial Gastrostomy tube left upper quadrant without any leaking       Results:   Cultures: Recent Results (from the past 720 hours)  Aerobic/Anaerobic Culture w Gram Stain (surgical/deep wound)  Status: None   Collection Time: 05/27/24  5:19 PM   Specimen: Abscess  Result Value Ref Range Status   Specimen Description   Final    ABSCESS ABDOMEN Performed at [facility], [address] [place], [place] [address]    Special Requests   Final    NONE Performed at [facility], [address] [place], [place] [address]    Gram Stain   Final    ABUNDANT WBC PRESENT, PREDOMINANTLY PMN NO ORGANISMS SEEN    Culture   Final    RARE PSEUDOMONAS AERUGINOSA RARE CANDIDA ALBICANS NO ANAEROBES ISOLATED Performed at [facility], [address] [place], [place] [address]    Report Status 06/01/2024  FINAL  Final   Organism ID, Bacteria PSEUDOMONAS AERUGINOSA  Final      Susceptibility   Pseudomonas aeruginosa - MIC*    CEFTAZIDIME 4 SENSITIVE Sensitive     CIPROFLOXACIN <=0.25 SENSITIVE Sensitive     GENTAMICIN 4 SENSITIVE Sensitive     IMIPENEM 2 SENSITIVE Sensitive     PIP/TAZO <=4 SENSITIVE Sensitive ug/mL    CEFEPIME 2 SENSITIVE Sensitive     * RARE PSEUDOMONAS AERUGINOSA  Surgical pcr screen     Status: Abnormal   Collection Time: 06/02/24  7:55 PM   Specimen: Nasal Mucosa; Nasal Swab  Result Value Ref Range Status   MRSA, PCR POSITIVE (A) NEGATIVE Final   Staphylococcus aureus POSITIVE (A) NEGATIVE Final    Comment: (NOTE) The Xpert SA Assay (FDA approved for NASAL specimens in patients 61 years of age and older), is one component of a comprehensive surveillance program. It is not intended to diagnose infection nor to guide or monitor treatment. Performed at [facility], [address], [place], [place] [address]   Aerobic/Anaerobic Culture w Gram Stain (surgical/deep wound)     Status: None   Collection Time: 06/11/24  4:12 PM   Specimen: Abdomen; Abdominal Fluid  Result Value Ref Range Status   Specimen Description   Final    ABDOMEN Performed at [facility], [address] [place], [place] [address]    Special Requests   Final    ABDOMINAL FLUID Performed at [facility], [address], [place], [place] [address]    Gram Stain   Final    ABUNDANT WBC PRESENT, PREDOMINANTLY PMN ABUNDANT GRAM NEGATIVE RODS FEW GRAM POSITIVE COCCI    Culture   Final    MODERATE PSEUDOMONAS AERUGINOSA Two isolates with different morphologies were identified as the same organism.The most resistant organism was reported. FEW METHICILLIN RESISTANT STAPHYLOCOCCUS AUREUS MODERATE ENTEROCOCCUS FAECALIS MODERATE CLOSTRIDIUM INNOCUUM Standardized susceptibility testing for this organism is not available. Performed at [facility], [address] [place], [place] [address]    Report Status 06/16/2024 FINAL  Final   Organism ID, Bacteria PSEUDOMONAS AERUGINOSA  Final   Organism ID, Bacteria ENTEROCOCCUS FAECALIS  Final   Organism ID, Bacteria METHICILLIN RESISTANT STAPHYLOCOCCUS AUREUS  Final      Susceptibility   Enterococcus faecalis - MIC*    AMPICILLIN <=2 SENSITIVE Sensitive     VANCOMYCIN  1 SENSITIVE Sensitive     GENTAMICIN SYNERGY SENSITIVE Sensitive     * MODERATE ENTEROCOCCUS FAECALIS   Methicillin resistant staphylococcus aureus - MIC*    CIPROFLOXACIN >=8 RESISTANT Resistant     ERYTHROMYCIN  >=8 RESISTANT Resistant     GENTAMICIN <=0.5 SENSITIVE Sensitive     OXACILLIN >=4  RESISTANT Resistant     TETRACYCLINE <=1 SENSITIVE Sensitive     VANCOMYCIN  1 SENSITIVE Sensitive     TRIMETH/SULFA >=320 RESISTANT Resistant     CLINDAMYCIN <=0.25 SENSITIVE Sensitive     RIFAMPIN <=0.5 SENSITIVE Sensitive     Inducible Clindamycin NEGATIVE Sensitive     LINEZOLID 2 SENSITIVE Sensitive     * FEW METHICILLIN RESISTANT STAPHYLOCOCCUS AUREUS   Pseudomonas aeruginosa - MIC*    CEFTAZIDIME >=64 RESISTANT Resistant     CIPROFLOXACIN 0.5 SENSITIVE Sensitive     GENTAMICIN 4 SENSITIVE Sensitive     IMIPENEM 2 SENSITIVE Sensitive     * MODERATE PSEUDOMONAS AERUGINOSA  Aerobic/Anaerobic Culture w Gram Stain (surgical/deep wound)     Status: None   Collection Time: 06/16/24  1:08 PM   Specimen: Abscess  Result Value Ref Range Status   Specimen Description   Final    ABSCESS ABDOMEN Performed at [facility], [address], [place], [place] [address]    Special Requests   Final    NONE Performed at [facility], [address], [place], [place] [address]    Gram Stain NO WBC SEEN NO ORGANISMS SEEN   Final   Culture   Final    No growth aerobically or anaerobically. Performed at [facility], [address], [place], [place] [address]    Report Status  06/21/2024 FINAL  Final  Fungus Culture With Stain     Status: None (Preliminary result)   Collection Time: 06/19/24  3:22 PM   Specimen: PATH Cytology Pleural fluid  Result Value Ref Range Status   Fungus Stain Final report  Final    Comment: (NOTE) Performed At: [facility] [address] [place], [place] [id number] Jennette Shorter MD Ey:[id number]    Fungus (Mycology) Culture PENDING  Incomplete   Fungal Source PLEURAL  Final    Comment: Performed at [facility], [address], [place], [place] [address]  Culture, Fungus without Smear     Status: None (Preliminary result)   Collection Time: 06/19/24  3:22 PM   Specimen: PATH Cytology Pleural fluid  Result Value Ref Range Status   Specimen Description   Final    PLEURAL CYTO Performed at [facility], [address] [place], [place] [address]    Special Requests   Final    NONE Performed at [facility], [address], [place], [place] [address]    Culture   Final    NO GROWTH 4 DAYS Performed at [facility], [address], [place], [place] [address]    Report Status PENDING  Incomplete  Acid Fast Smear (AFB)     Status: None   Collection Time: 06/19/24  3:22 PM   Specimen: PATH Cytology Pleural fluid  Result Value Ref Range Status   AFB Specimen Processing Concentration  Final   Acid Fast Smear Negative  Final    Comment: (NOTE) Performed At: [facility] [address] [place], [place] [id number] Jennette Shorter MD Ey:[id number]    Source (AFB) PLEURAL  Final    Comment: Performed at [facility], [address] [place], [place] [address]  Body fluid culture w Gram Stain     Status: None   Collection Time: 06/19/24  3:22 PM   Specimen: PATH Cytology Pleural fluid  Result Value Ref Range Status   Specimen Description   Final    PLEURAL CYTO Performed  at [facility], [address]  [place], [place] [address]    Special Requests   Final    NONE Performed at [facility], [address] [place], [place] [address]    Gram Stain   Final    FEW WBC PRESENT, PREDOMINANTLY PMN NO ORGANISMS SEEN    Culture   Final    NO GROWTH 3 DAYS Performed at [facility], [address] [place], [place] [address]    Report Status 06/22/2024 FINAL  Final  Fungus Culture Result     Status: None   Collection Time: 06/19/24  3:22 PM  Result Value Ref Range Status   Result 1 Comment  Final    Comment: (NOTE) KOH/Calcofluor preparation:  no fungus observed. Performed At: [facility] [address] [place], [place] [id number] Jennette Shorter MD Ey:[id number]     Labs: Results for orders placed or performed during the hospital encounter of 05/08/24 (from the past 48 hours)  CK     Status: Abnormal   Collection Time: 06/23/24  2:32 AM  Result Value Ref Range   Total CK 31 (L) 38 - 234 U/L    Comment: Performed at [facility], [address], [place], [place] [address]  Comprehensive metabolic panel     Status: Abnormal   Collection Time: 06/23/24  2:32 AM  Result Value Ref Range   Sodium 132 (L) 135 - 145 mmol/L   Potassium 4.0 3.5 - 5.1 mmol/L   Chloride 103 98 - 111 mmol/L   CO2 21 (L) 22 - 32 mmol/L   Glucose, Bld 121 (H) 70 - 99 mg/dL    Comment: Glucose reference range applies only to samples taken after fasting for at least 8 hours.   BUN 23 8 - 23 mg/dL   Creatinine, Ser <9.69 (L) 0.44 - 1.00 mg/dL   Calcium  8.0 (L) 8.9 - 10.3 mg/dL   Total Protein 6.7 6.5 - 8.1 g/dL   Albumin  1.8 (L) 3.5 - 5.0 g/dL   AST 55 (H) 15 - 41 U/L   ALT 47 (H) 0 - 44 U/L   Alkaline Phosphatase 278 (H) 38 - 126 U/L   Total Bilirubin 1.7 (H) 0.0 - 1.2 mg/dL   GFR, Estimated NOT CALCULATED >60 mL/min    Comment: (NOTE) Calculated using the CKD-EPI Creatinine Equation (2021)    Anion gap 8 5 - 15    Comment: Performed at [facility], [address], [place], [place] [address]  Magnesium      Status: None   Collection Time: 06/23/24  2:32 AM  Result Value Ref Range   Magnesium  1.9 1.7 - 2.4 mg/dL    Comment: Performed at [facility], [address] [place], [place] [address]  Phosphorus     Status: None   Collection Time: 06/23/24  2:32 AM  Result Value Ref Range   Phosphorus 2.8 2.5 - 4.6 mg/dL    Comment: Performed at [facility], [address] [place], [place] [address]  Triglycerides     Status: Abnormal   Collection Time: 06/23/24  2:32 AM  Result Value Ref Range   Triglycerides 194 (H) <150 mg/dL    Comment: Performed at [facility], [address] [place], [place] [address]  Prealbumin     Status: None   Collection Time: 06/23/24 12:45 PM  Result Value Ref Range   Prealbumin 18 18 - 38  mg/dL    Comment: Performed at [facility], [address], [place], [place] [address]  Basic metabolic panel with GFR     Status: Abnormal   Collection Time: 06/24/24  1:24 AM  Result Value Ref Range   Sodium 131 (L) 135 - 145 mmol/L   Potassium 4.1 3.5 - 5.1 mmol/L   Chloride 100 98 - 111 mmol/L   CO2 24 22 - 32 mmol/L   Glucose, Bld 155 (H) 70 - 99 mg/dL    Comment: Glucose reference range applies only to samples taken after fasting for at least 8 hours.   BUN 24 (H) 8 - 23 mg/dL   Creatinine, Ser 9.63 (L) 0.44 - 1.00 mg/dL   Calcium  8.2 (L) 8.9 - 10.3 mg/dL   GFR, Estimated >39 >39 mL/min    Comment: (NOTE) Calculated using the CKD-EPI Creatinine Equation (2021)    Anion gap 7 5 - 15    Comment: Performed at [facility], [address] [place], [place] [address]  Magnesium      Status: None   Collection Time: 06/24/24  1:24 AM  Result Value Ref Range   Magnesium  2.0 1.7 - 2.4 mg/dL    Comment: Performed at [facility], [address], [place], [place] [address]  Phosphorus      Status: None   Collection Time: 06/24/24  1:24 AM  Result Value Ref Range   Phosphorus 3.2 2.5 - 4.6 mg/dL    Comment: Performed at [facility], [address], [place], [place] [address]    Imaging / Studies: No results found.        Medications / Allergies: per chart  Antibiotics: Anti-infectives (From admission, onward)    Start     Dose/Rate Route Frequency Ordered Stop   06/15/24 1600  meropenem  (MERREM ) 1 g in sodium chloride  0.9 % 100 mL IVPB        1 g 200 mL/hr over 30 Minutes Intravenous Every 8 hours 06/15/24 1510     06/15/24 1600  DAPTOmycin  (CUBICIN ) IVPB 700 mg/100mL premix        700 mg 200 mL/hr over 30 Minutes Intravenous Daily 06/15/24 1510     06/11/24 1545  piperacillin -tazobactam (ZOSYN ) IVPB 3.375 g  Status:  Discontinued        3.375 g 12.5 mL/hr over 240 Minutes Intravenous Every 8 hours 06/11/24 1457 06/15/24 1510   06/09/24 1745  DAPTOmycin  (CUBICIN ) IVPB 500 mg/50mL premix  Status:  Discontinued        500 mg 100 mL/hr over 30 Minutes Intravenous Daily 06/09/24 1647 06/13/24 1539   06/06/24 1000  micafungin  (MYCAMINE ) 100 mg in sodium chloride  0.9 % 100 mL IVPB  Status:  Discontinued        100 mg 105 mL/hr over 1 Hours Intravenous Every 24 hours 06/05/24 1349 06/12/24 1142   06/04/24 1400  piperacillin -tazobactam (ZOSYN ) IVPB 3.375 g        3.375 g 12.5 mL/hr over 240 Minutes Intravenous Every 8 hours 06/04/24 0738 06/09/24 2124   06/04/24 1400  DAPTOmycin  (CUBICIN ) IVPB 500 mg/88mL premix        8 mg/kg  60.9 kg 100 mL/hr over 30 Minutes Intravenous Daily 06/04/24 0738 06/10/24 0126   06/03/24 1000  neomycin  (MYCIFRADIN ) tablet 500 mg  Status:  Discontinued        500 mg Oral 3 times daily 06/03/24 0935 06/03/24 1555   06/03/24 1000  metroNIDAZOLE  (FLAGYL ) tablet  500 mg  Status:  Discontinued       Note to Pharmacy: Take 2 pills (=1000mg ) by mouth at 1pm, 3pm, and 10pm the day before your colorectal operation   500 mg  Oral 3 times daily 06/03/24 0935 06/03/24 1555   05/29/24 1500  DAPTOmycin  (CUBICIN ) IVPB 500 mg/50mL premix  Status:  Discontinued        8 mg/kg  60.9 kg 100 mL/hr over 30 Minutes Intravenous Daily 05/29/24 1359 06/04/24 0738   05/28/24 2100  vancomycin  (VANCOREADY) IVPB 750 mg/150 mL  Status:  Discontinued        750 mg 150 mL/hr over 60 Minutes Intravenous Every 12 hours 05/28/24 1419 05/29/24 1359   05/27/24 0800  vancomycin  (VANCOCIN ) IVPB 1000 mg/200 mL premix  Status:  Discontinued        1,000 mg 200 mL/hr over 60 Minutes Intravenous Every 24 hours 05/26/24 1144 05/28/24 1419   05/23/24 1400  piperacillin -tazobactam (ZOSYN ) IVPB 3.375 g  Status:  Discontinued        3.375 g 12.5 mL/hr over 240 Minutes Intravenous Every 8 hours 05/23/24 0804 06/04/24 0738   05/23/24 1200  micafungin  (MYCAMINE ) 100 mg in sodium chloride  0.9 % 100 mL IVPB        100 mg 105 mL/hr over 1 Hours Intravenous Every 24 hours 05/23/24 0755 06/05/24 1221   05/22/24 0800  vancomycin  (VANCOCIN ) IVPB 1000 mg/200 mL premix  Status:  Discontinued        1,000 mg 200 mL/hr over 60 Minutes Intravenous Every 24 hours 05/21/24 0634 05/26/24 0806   05/21/24 0730  vancomycin  (VANCOREADY) IVPB 1250 mg/250 mL        1,250 mg 166.7 mL/hr over 90 Minutes Intravenous  Once 05/21/24 0634 05/21/24 1043   05/19/24 1400  piperacillin -tazobactam (ZOSYN ) IVPB 3.375 g  Status:  Discontinued        3.375 g 12.5 mL/hr over 240 Minutes Intravenous Every 8 hours 05/19/24 1303 05/23/24 0804   05/09/24 0900  erythromycin  250 mg in sodium chloride  0.9 % 100 mL IVPB        250 mg 100 mL/hr over 60 Minutes Intravenous Every 8 hours 05/09/24 0732 05/11/24 0044   05/08/24 2200  cefoTEtan  (CEFOTAN ) 2 g in sodium chloride  0.9 % 100 mL IVPB        2 g 200 mL/hr over 30 Minutes Intravenous Every 12 hours 05/08/24 1759 05/09/24 0749   05/08/24 1400  neomycin  (MYCIFRADIN ) tablet 1,000 mg  Status:  Discontinued       Placed in And Linked  Group   1,000 mg Oral 3 times per day 05/08/24 1119 05/08/24 1120   05/08/24 1400  metroNIDAZOLE  (FLAGYL ) tablet 1,000 mg  Status:  Discontinued       Placed in And Linked Group   1,000 mg Oral 3 times per day 05/08/24 1119 05/08/24 1120   05/08/24 1130  cefoTEtan  (CEFOTAN ) 2 g in sodium chloride  0.9 % 100 mL IVPB        2 g 200 mL/hr over 30 Minutes Intravenous On call to O.R. 05/08/24 1119 05/09/24 0749         Note: Portions of this report may have been transcribed using voice recognition software. Every effort was made to ensure accuracy; however, inadvertent computerized transcription errors may be present.   Any transcriptional errors that result from this process are unintentional.    Elspeth KYM Schultze, MD, FACS, MASCRS Esophageal, Gastrointestinal & Colorectal Surgery Robotic and Minimally Invasive Surgery  [facility] [address] [place], [place] [address] [phone] Fax [phone] Main  CONTACT INFORMATION: Weekday (9AM-5PM): Call [facility] main office at [phone] Weeknight (5PM-9AM) or Weekend/Holiday: Check EPIC Web Links tab & use AMION (password  [email or web address]) for General Surgery [facility] coverage  Please, DO NOT use SecureChat  (it is not reliable communication to reach operating surgeons & will lead to a delay in care).   Epic staff messaging available for outptient concerns needing 1-2 business day response.      06/24/2024  9:19 AM

## 2024-06-24 NOTE — Progress Notes (Signed)
  Daily Progress Note   Patient Name: Elizabeth Odom       Date: 06/24/2024 DOB: 07/18/1941  Age: 83 y.o. MRN#: [id number] Attending Physician: [name], MD Primary Care Physician: Jesus Bernardino MATSU, MD Admit Date: 05/08/2024 Length of Stay: 47 days  Reason for Consultation/Follow-up: Establishing goals of care  Subjective:   Reviewed recent EMR documentation as well as surgery documentation  Patient resting in bed, appears chronically ill, at times, resting comfortably at present, however, has required several IV PRN opioids in the past 24 hours.  Vital Signs:  BP (!) 143/91 (BP Location: Left Arm)   Pulse 81   Temp 98 F (36.7 C) (Axillary)   Resp 18   Ht 5' 5 (1.651 m)   Wt 69.6 kg   SpO2 94%   BMI 25.53 kg/m   Physical Exam: General:resting comfortably cachectic, frail Cardiovascular: RRR Respiratory: no increased work of breathing noted, not in respiratory distress    Assessment & Plan:   Assessment: Patient is an 83 year old female with a past medical history of DVT, PE, atrial fibrillation, hypertension, mild cognitive impairment, and large bowel obstruction status post colonic resection and colostomy creation in 2020 for which has been complicated by anastomotic leak.  Patient presented last month for colostomy resection and takedown but had complicated postop course with dense adhesions, A-fib with RVR, intra-abdominal abscess, and poor oral intake requiring support of TPN and PEG placement.  Palliative medicine team consulted to assist with complex medical decision making.  Recommendations/Plan: # Complex medical decision making/goals of care:  -   discussed with Dr Sheldon as well as call placed and discussed with grand daughter [name] [facility].  DNR DNI Comfort care Residential hospice evaluation Start low dose Morphine  infusion, have provision for bolus doses and monitor for comfort.  Ok to continue TPN until hospice eval, discussed with grand daughter  that at hospice house, comfort feeds such as clear to full liquids will be given on an as needed basis and that no artificial nutrition is provided.  Grand daughter asks for some more time, before she decided on choice of hospice facility as her mother, the patient's daughter is coming into town today, to see the patient.   -  Code Status: Do not attempt resuscitation (DNR) - Comfort care    Discussed extensively with grand daughter HCPOA again on the phone this am 7-15.  # Symptom management:  Medication history noted.  Also on Atarax  PRN for itching.   general surgery recommendations noted.  # Psychosocial support  - Already consulted chaplain to assist with spiritual support. Appreciate spiritual care.    # Discharge Planning:  [facility] consult for residential hospice evaluation has been completed, awaiting HCPOA decision regarding choice of hospice facility.   Thank you for allowing the palliative care team to participate in the care Elizabeth Odom.  mod MDM Lonia Serve MD.  Palliative Care Provider PMT # [phone]  If patient remains symptomatic despite maximum doses, please call PMT at [phone] between 0700 and 1900. Outside of these hours, please call attending, as PMT does not have night coverage.

## 2024-06-25 DIAGNOSIS — R52 Pain, unspecified: Secondary | ICD-10-CM | POA: Diagnosis not present

## 2024-06-25 DIAGNOSIS — Z86718 Personal history of other venous thrombosis and embolism: Secondary | ICD-10-CM

## 2024-06-25 DIAGNOSIS — Z79899 Other long term (current) drug therapy: Secondary | ICD-10-CM

## 2024-06-25 DIAGNOSIS — K5730 Diverticulosis of large intestine without perforation or abscess without bleeding: Secondary | ICD-10-CM | POA: Diagnosis not present

## 2024-06-25 DIAGNOSIS — Z515 Encounter for palliative care: Secondary | ICD-10-CM | POA: Diagnosis not present

## 2024-06-25 DIAGNOSIS — Z7189 Other specified counseling: Secondary | ICD-10-CM | POA: Diagnosis not present

## 2024-06-25 MED ORDER — METOPROLOL TARTRATE 25 MG/10 ML ORAL SUSPENSION
12.5000 mg | Freq: Two times a day (BID) | ORAL | Status: DC
  Filled 2024-06-25: qty 5

## 2024-06-25 MED ORDER — HYDROXYZINE HCL 10 MG/5ML PO SYRP: 10.0000 mg | ORAL_SOLUTION | Freq: Three times a day (TID) | ORAL | Status: DC | PRN

## 2024-06-25 MED ORDER — GABAPENTIN 250 MG/5ML PO SOLN
300.0000 mg | Freq: Three times a day (TID) | ORAL | Status: DC
  Administered 2024-06-25: 300 mg via ORAL
  Filled 2024-06-25 (×2): qty 6

## 2024-06-25 MED ORDER — OXYCODONE HCL 5 MG/5ML PO SOLN: 5.0000 mg | ORAL | Status: DC | PRN

## 2024-06-25 MED ORDER — ACETAMINOPHEN 160 MG/5ML PO SOLN
1000.0000 mg | Freq: Four times a day (QID) | ORAL | Status: DC
  Administered 2024-06-25: 1000 mg
  Filled 2024-06-25: qty 40.6

## 2024-06-25 MED ORDER — TRAVASOL 10 % IV SOLN
INTRAVENOUS | Status: DC
  Filled 2024-06-25: qty 480

## 2024-06-25 NOTE — Progress Notes (Signed)
 PHARMACY - TOTAL PARENTERAL NUTRITION CONSULT NOTE   Indication: Prolonged ileus  Patient Measurements: Height: 5' 5 (165.1 cm) Weight:  (Unable to weight pt due to zeroed bed) IBW/kg (Calculated) : 57 TPN AdjBW (KG): 61.3 Body mass index is 25.53 kg/m.  Assessment: 66 yoF with history of diverticulitis with perforation and abscess. 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 She requested ostomy takedown and underwent LOA, colostomy takedown, small bowel resection with anastomosis on 5/29. Pharmacy is consulted to dose TPN starting 6/2 for postop ileus.  Post-op course complicated by anastomotic leak, multiple-intraabdominal abscesses. Post-op bleeding. Patient is now s/p diverting loop transverse colostomy.  Patient now transitioned to comfort-focused measures; family would like to continue TPN however, until all visiting family have had arrived  Glucose / Insulin : no Hx DM, A1c 5.1%; CBG goal 100-150 - No longer monitoring, SSI off Electrolytes:  - Na remains  low; all other lytes stable WNL. 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 Additional small loculated fluid collection small bowel mesentery.  - 7/1 CTa/p: Multiple complex fluid collections within the upper abdomen  - 7/5 CTa/p: No well-defined or clear enterorectal fistula. Persistent fluid collections again noted in the abdomen and pelvis, possibly from prior hemorrhage, unchanged from prior. Enlarging anterior pelvic fluid collection at prior drain site  GI Surgeries / Procedures: - 5/29: LAR, SBR, colostomy takedown, LOA - 6/10 drains placed x 2 in IR (midline abd, transgluteal) - 6/17: Replacement of drainage catheter by IR - 6/24: diverting loop transverse colostomy, I&D of abdominal abscess, gastrotomy tube placement, rectal tube placement.  - 7/3: CT guided visceral fluid drain by perc cath - 7/7: CT drainage of the left anterior abdominal fluid collection  Central access: Double lumen PICC placed 6/2 TPN start date: 6/2>7/1, resume 7/4>>  Nutritional Goals: Goal TPN rate is 70 mL/hr (provides 94 g of protein and 1680 kcals per day)  RD Assessment:  Estimated Needs Total Energy Estimated Needs: 1600-1750 kcals Total Protein Estimated Needs: 80-95 grams Total Fluid Estimated Needs: >/= 1.6L  Current Nutrition:  7/9 FLD, TPN at goal rate   Plan:  In the interest of minimizing labs for patient comfort, will reduce TPN rate (reducing risk of hypoglycemia if TPN interrupted), and remove components possibly contributing to elevated LFTs   At 1800, reduce TPN to 40 ml/hr Will remove lipids d/t rising triglycerides Remove trace elements given rising Tbili Electrolytes in TPN: continue max Na, adjust other components to initial values Na 154 mEq/L (max) K 50 mEq/L  Ca 5 mEq/L Mg 5 mEq/L  Phos 15 mmol/L  Cl:Ac 1:1 Continue MVI w/o trace in TPN No further CBG checks with  excellent control; trend BG on routine Bmet MIVF per MD (none currently) Monitor TPN  labs on Mon/Thurs minimally per protocol, and as needed   Thank you for allowing pharmacy to be a part of this patient's care.  Aldair Rickel Karoline Marina, PharmD, BCPS Clinical Staff Pharmacist 06/25/2024, 9:00 AM

## 2024-06-25 NOTE — Progress Notes (Signed)
 06/25/2024  Elda CHRISTELLA Lecher [id number] 24-May-1941  CARE TEAM: PCP: Jesus Bernardino MATSU, MD  Outpatient Care Team: Patient Care Team: Jesus Bernardino MATSU, MD as PCP - General (Internal Medicine) [name], MD as Consulting Physician (Gastroenterology) [name], MD (Neurology) [name], MD as Consulting Physician (Urology) [name], MD as Consulting Physician (Cardiology) [name], MD as Consulting Physician (Colon and Rectal Surgery)  Inpatient Treatment Team: Treatment Team:  [name], MD [name], RN [name], MD [name], MD Haig Damien CHRISTELLA, RN Waddell Suzen BRAVO, NT Casimir Camelia RAMAN, [place] Kriste Asberry BRAVO, RN   Problem List:   Principal Problem:   Diverticular disease Active Problems:   A-fib [facility])   Restless leg   ABLA (acute blood loss anemia)   Need for emotional support   Acute urinary retention   Mixed hyperlipidemia   Essential hypertension   Diverticular disease of left colon   History of DVT (deep vein thrombosis)   History of pulmonary embolus (PE)   Pre-diabetes   Presence of IVC filter   Stricture of sigmoid colon (HCC)   Protein-calorie malnutrition, severe   Hypokalemia   S/P percutaneous endoscopic gastrostomy (PEG) tube placement [facility])   Goals of care, counseling/discussion   Shock, during or resulting from a surgical procedure   Intra-abdominal abscess (HCC)   05/08/2024  POST-OPERATIVE DIAGNOSIS:   COLOSTOMY FOR RESECTION, DESIRE FOR OSTOMY TAKEDOWN RECTAL STRICTURE HISTORY OF DIVERTICULITIS WITH PERFORATION & ABSCESS   PROCEDURE:   -ROBOTIC RECTOSIGMOID RESECTION (LAR) -TAKEDOWN OF END COLOSTOMY WITH ANASTOMOSIS -RESECTION OF SMALL INTESTINE WITH ANASTOMOSIS -SMALL BOWEL REPAIR -LYSIS OF ADHESIONS x 115 MINUTES (66% OF CASE),  -INTRAOPERATIVE ASSESSMENT OF TISSUE VASCULAR PERFUSION USING ICG (indocyanine green ) IMMUNOFLUORESCENCE,  -TRANSVERSUS ABDOMINIS PLANE (TAP)  BLOCK - BILATERAL -FLEXIBLE SIGMOIDOSCOPY   SURGEON:  [name], MD   05/20/2024  Post procedural Dx: Post op abscess, multiple   Technically successful CT guided placed of a  10 Fr drainage catheter placement x 2  into the midline ventral and dorsal perirectal abscesses.     [name] LABOR, MD    05/28/2024  IMPRESSION: Satisfactory removal of the midline anterior abscess drainage catheter and placement of a new midline anterior pelvic drainage catheter as well as a posterior transgluteal drainage catheter in pelvic abscesses.     Electronically Signed   By: [name]    06/03/2024  POST-OPERATIVE DIAGNOSIS:   DELAYED COLORECTAL ANASTOMOTIC LEAK, NEED FOR FECAL DIVERSION CHRONIC PELVIC ABSCESS   PROCEDURE:   DIAGNOSTIC LAPAROSCOPY CONVERTED TO LAPAROTOMY DIVERTING LOOP TRANSVERSE COLOSTOMY DRAINAGE OF ABDOMINAL ABSCESS GASTROSTOMY TUBE PLACEMENT RECTAL TUBE PLACEMENT   SURGEON:  [name], MD   OR FINDINGS: Very dense concrete intraperitoneal and pelvic adhesions infraumbilically.  Bladder, small bowel, omentum walling off the lower pelvis with delayed anastomotic leak.   Near disruption of colorectal anastomosis by transanal examination.  Rectal tube placed through rectal stump to drain the pelvis more directly.   Diverting mid-transverse loop colostomy done.   Removal of old percutaneous abdominal and transgluteal drains.     Assessment [facility] Stay = 48 days) 22 Days Post-Op    Failure to thrive with delayed anastomotic leaks     Plan:  Discussion made with patient and granddaughter 7/14.  Recommendation making DNR/DNI and more comfort care.  Dr. Jeryl with palliative care who is working to help with that.   Patient still with significant pain.  Transitioning into parenteral morphine .  Still with soreness but more comfortable.  Granddaughter told me she was going to try and reach out with the patient's children to see if  they can touch base since they have not been around.  Plan to keep on TPN and hydration for now until family visits are done.  Transitioning to simpler regimen for now  Stopped antibiotics per infectious disease.   No strong evidence of any recurrent infection on abdominal fluid collection drainage and right pleural drainage.  Most likely transition to [facility].     I reviewed last 24 h vitals and pain scores, last 48 h intake and output, last 24 h labs and trends, and last 24 h imaging results.  I have reviewed this patient's available data, including medical history, events of note, test results, etc as part of my evaluation.   A significant portion of that time was spent in counseling. Care during the described time interval was provided by me.  This care required moderate level of medical decision making.  06/25/2024    Subjective: (Chief complaint)  Patient resting.  On IV morphine .  Sipping liquids denies nausea.  No family in room at this time.        Objective:  Vital signs:  Vitals:   06/23/24 2126 06/24/24 1515 06/24/24 2147 06/25/24 0551  BP: (!) 143/91 126/77 114/77 118/79  Pulse: 81 83 97 (!) 103  Resp: 18  14 15   Temp: 98 F (36.7 C) 98.6 F (37 C) 98.7 F (37.1 C)   TempSrc: Axillary  Oral   SpO2: 94% 92% 95% 94%  Weight:      Height:        Last BM Date : 06/22/24  Intake/Output   Yesterday:  07/15 0701 - 07/16 0700 In: 873 [P.O.:360; I.V.:513] Out: 1020 [Urine:950; Drains:70] This shift:  No intake/output data recorded.   Bowel function:  Flatus: YES  BM:  YES -scant stool and flatus in colostomy bag    Drain:  Left upper quadrant percutaneous tube 7/7 thinly serosanguineous  Gastrostomy tube 6/24 with thin bilious effluent and gravity bag    Physical Exam:  General: Sleeping.  Awakens with mild mumbling.  Mildly tearful but coming to terms.  Looks more comfortable.     Eyes: PERRL, normal EOM.  Sclera clear.   No icterus Neuro: CN II-XII intact w/o focal sensory/motor deficits.  No facial droop. Lymph: No head/neck/groin lymphadenopathy Psych:  No delerium/psychosis/paranoia.  Oriented x 4 HENT: Normocephalic, Mucus membranes moist.  No thrush.  Mild HOH Neck: Supple, No tracheal deviation.  No obvious thyromegaly.  Chest: No pain to chest wall compression.  Good respiratory excursion.  Mild conversational dyspnea and tachypnea.  No audible wheezing.   CV:  Pulses intact.  Mostly regular rhythm.  No major extremity edema MS: Normal AROM mjr joints.  No obvious deformity  Abdomen:  Most of abdomen with firmness consistent with rectus sheath abdominal wall hematoma.  Right side of abdomen softer.  Moderately distended.  Mildly tender at incisions only.  No guarding.   Right upper quadrant mid transverse diverting loop colostomy pink with edema.  Scant stool  Midline incision with some granulation. superificial Gastrostomy tube left upper quadrant without any leaking       Results:   Cultures: Recent Results (from the past 720 hours)  Aerobic/Anaerobic Culture w Gram Stain (surgical/deep wound)     Status: None   Collection Time: 05/27/24  5:19 PM   Specimen: Abscess  Result Value  Ref Range Status   Specimen Description   Final    ABSCESS ABDOMEN Performed at [facility], [address], [place], [place] [address]    Special Requests   Final    NONE Performed at [facility], [address] [place], [place] [address]    Gram Stain   Final    ABUNDANT WBC PRESENT, PREDOMINANTLY PMN NO ORGANISMS SEEN    Culture   Final    RARE PSEUDOMONAS AERUGINOSA RARE CANDIDA ALBICANS NO ANAEROBES ISOLATED Performed at [facility], [address], [place], [place] [address]    Report Status 06/01/2024 FINAL  Final   Organism ID, Bacteria PSEUDOMONAS AERUGINOSA  Final      Susceptibility   Pseudomonas aeruginosa - MIC*    CEFTAZIDIME 4 SENSITIVE  Sensitive     CIPROFLOXACIN <=0.25 SENSITIVE Sensitive     GENTAMICIN 4 SENSITIVE Sensitive     IMIPENEM 2 SENSITIVE Sensitive     PIP/TAZO <=4 SENSITIVE Sensitive ug/mL    CEFEPIME 2 SENSITIVE Sensitive     * RARE PSEUDOMONAS AERUGINOSA  Surgical pcr screen     Status: Abnormal   Collection Time: 06/02/24  7:55 PM   Specimen: Nasal Mucosa; Nasal Swab  Result Value Ref Range Status   MRSA, PCR POSITIVE (A) NEGATIVE Final   Staphylococcus aureus POSITIVE (A) NEGATIVE Final    Comment: (NOTE) The Xpert SA Assay (FDA approved for NASAL specimens in patients 25 years of age and older), is one component of a comprehensive surveillance program. It is not intended to diagnose infection nor to guide or monitor treatment. Performed at [facility], [address], [place], [place] [address]   Aerobic/Anaerobic Culture w Gram Stain (surgical/deep wound)     Status: None   Collection Time: 06/11/24  4:12 PM   Specimen: Abdomen; Abdominal Fluid  Result Value Ref Range Status   Specimen Description   Final    ABDOMEN Performed at [facility], [address] [place], [place] [address]    Special Requests   Final    ABDOMINAL FLUID Performed at [facility], [address] [place], [place] [address]    Gram Stain   Final    ABUNDANT WBC PRESENT, PREDOMINANTLY PMN ABUNDANT GRAM NEGATIVE RODS FEW GRAM POSITIVE COCCI    Culture   Final    MODERATE PSEUDOMONAS AERUGINOSA Two isolates with different morphologies were identified as the same organism.The most resistant organism was reported. FEW METHICILLIN RESISTANT STAPHYLOCOCCUS AUREUS MODERATE ENTEROCOCCUS FAECALIS MODERATE CLOSTRIDIUM INNOCUUM Standardized susceptibility testing for this organism is not available. Performed at [facility], [address] [place], [place] [address]    Report Status 06/16/2024 FINAL  Final   Organism ID, Bacteria PSEUDOMONAS AERUGINOSA   Final   Organism ID, Bacteria ENTEROCOCCUS FAECALIS  Final   Organism ID, Bacteria METHICILLIN RESISTANT STAPHYLOCOCCUS AUREUS  Final      Susceptibility   Enterococcus faecalis - MIC*    AMPICILLIN <=2 SENSITIVE Sensitive     VANCOMYCIN  1 SENSITIVE Sensitive     GENTAMICIN SYNERGY SENSITIVE Sensitive     * MODERATE ENTEROCOCCUS FAECALIS   Methicillin resistant staphylococcus aureus - MIC*    CIPROFLOXACIN >=8 RESISTANT Resistant     ERYTHROMYCIN  >=8 RESISTANT Resistant     GENTAMICIN <=0.5 SENSITIVE Sensitive     OXACILLIN >=4 RESISTANT Resistant     TETRACYCLINE <=1 SENSITIVE Sensitive     VANCOMYCIN  1 SENSITIVE  Sensitive     TRIMETH/SULFA >=320 RESISTANT Resistant     CLINDAMYCIN <=0.25 SENSITIVE Sensitive     RIFAMPIN <=0.5 SENSITIVE Sensitive     Inducible Clindamycin NEGATIVE Sensitive     LINEZOLID 2 SENSITIVE Sensitive     * FEW METHICILLIN RESISTANT STAPHYLOCOCCUS AUREUS   Pseudomonas aeruginosa - MIC*    CEFTAZIDIME >=64 RESISTANT Resistant     CIPROFLOXACIN 0.5 SENSITIVE Sensitive     GENTAMICIN 4 SENSITIVE Sensitive     IMIPENEM 2 SENSITIVE Sensitive     * MODERATE PSEUDOMONAS AERUGINOSA  Aerobic/Anaerobic Culture w Gram Stain (surgical/deep wound)     Status: None   Collection Time: 06/16/24  1:08 PM   Specimen: Abscess  Result Value Ref Range Status   Specimen Description   Final    ABSCESS ABDOMEN Performed at [facility], [address], [place], [place] [address]    Special Requests   Final    NONE Performed at [facility], [address] [place], [place] [address]    Gram Stain NO WBC SEEN NO ORGANISMS SEEN   Final   Culture   Final    No growth aerobically or anaerobically. Performed at [facility], [address], [place], [place] [address]    Report Status 06/21/2024 FINAL  Final  Fungus Culture With Stain     Status: None (Preliminary result)   Collection Time: 06/19/24  3:22 PM   Specimen: PATH  Cytology Pleural fluid  Result Value Ref Range Status   Fungus Stain Final report  Final    Comment: (NOTE) Performed At: [facility] [address] [place], [place] [id number] Jennette Shorter MD Ey:[id number]    Fungus (Mycology) Culture PENDING  Incomplete   Fungal Source PLEURAL  Final    Comment: Performed at [facility], [address], [place], [place] [address]  Culture, Fungus without Smear     Status: None (Preliminary result)   Collection Time: 06/19/24  3:22 PM   Specimen: PATH Cytology Pleural fluid  Result Value Ref Range Status   Specimen Description   Final    PLEURAL CYTO Performed at [facility], [address], [place], [place] [address]    Special Requests   Final    NONE Performed at [facility], [address] [place], [place] [address]    Culture   Final    NO FUNGUS ISOLATED AFTER 5 DAYS Performed at [facility], [address], [place], [place] [address]    Report Status PENDING  Incomplete  Acid Fast Smear (AFB)     Status: None   Collection Time: 06/19/24  3:22 PM   Specimen: PATH Cytology Pleural fluid  Result Value Ref Range Status   AFB Specimen Processing Concentration  Final   Acid Fast Smear Negative  Final    Comment: (NOTE) Performed At: [facility] [address] [place], [place] [id number] Jennette Shorter MD Ey:[id number]    Source (AFB) PLEURAL  Final    Comment: Performed at [facility], [address] [place], [place] [address]  Body fluid culture w Gram Stain     Status: None   Collection Time: 06/19/24  3:22 PM   Specimen: PATH Cytology Pleural fluid  Result Value Ref Range Status   Specimen Description   Final    PLEURAL CYTO Performed at [facility], [address] [place], [place] [address]  Special Requests   Final    NONE Performed at [facility], [address],  [place], [place] [address]    Gram Stain   Final    FEW WBC PRESENT, PREDOMINANTLY PMN NO ORGANISMS SEEN    Culture   Final    NO GROWTH 3 DAYS Performed at [facility], [address], [place], [place] [address]    Report Status 06/22/2024 FINAL  Final  Fungus Culture Result     Status: None   Collection Time: 06/19/24  3:22 PM  Result Value Ref Range Status   Result 1 Comment  Final    Comment: (NOTE) KOH/Calcofluor preparation:  no fungus observed. Performed At: [facility] [address] [place], [place] [id number] Jennette Shorter MD Ey:[id number]     Labs: Results for orders placed or performed during the hospital encounter of 05/08/24 (from the past 48 hours)  Prealbumin     Status: None   Collection Time: 06/23/24 12:45 PM  Result Value Ref Range   Prealbumin 18 18 - 38 mg/dL    Comment: Performed at [facility], [address] [place], [place] [address]  Basic metabolic panel with GFR     Status: Abnormal   Collection Time: 06/24/24  1:24 AM  Result Value Ref Range   Sodium 131 (L) 135 - 145 mmol/L   Potassium 4.1 3.5 - 5.1 mmol/L   Chloride 100 98 - 111 mmol/L   CO2 24 22 - 32 mmol/L   Glucose, Bld 155 (H) 70 - 99 mg/dL    Comment: Glucose reference range applies only to samples taken after fasting for at least 8 hours.   BUN 24 (H) 8 - 23 mg/dL   Creatinine, Ser 9.63 (L) 0.44 - 1.00 mg/dL   Calcium  8.2 (L) 8.9 - 10.3 mg/dL   GFR, Estimated >39 >39 mL/min    Comment: (NOTE) Calculated using the CKD-EPI Creatinine Equation (2021)    Anion gap 7 5 - 15    Comment: Performed at [facility], [address] [place], [place] [address]  Magnesium      Status: None   Collection Time: 06/24/24  1:24 AM  Result Value Ref Range   Magnesium  2.0 1.7 - 2.4 mg/dL    Comment: Performed at [facility], [address] [place], [place] [address]  Phosphorus     Status: None   Collection Time: 06/24/24  1:24 AM   Result Value Ref Range   Phosphorus 3.2 2.5 - 4.6 mg/dL    Comment: Performed at [facility], [address], [place], [place] [address]    Imaging / Studies: No results found.        Medications / Allergies: per chart  Antibiotics: Anti-infectives (From admission, onward)    Start     Dose/Rate Route Frequency Ordered Stop   06/15/24 1600  meropenem  (MERREM ) 1 g in sodium chloride  0.9 % 100 mL IVPB  Status:  Discontinued        1 g 200 mL/hr over 30 Minutes Intravenous Every 8 hours 06/15/24 1510 06/24/24 1209   06/15/24 1600  DAPTOmycin  (CUBICIN ) IVPB 700 mg/100mL premix  Status:  Discontinued        700 mg 200 mL/hr over 30 Minutes Intravenous Daily 06/15/24 1510 06/24/24 1209   06/11/24 1545  piperacillin -tazobactam (ZOSYN ) IVPB 3.375 g  Status:  Discontinued        3.375 g 12.5 mL/hr over 240  Minutes Intravenous Every 8 hours 06/11/24 1457 06/15/24 1510   06/09/24 1745  DAPTOmycin  (CUBICIN ) IVPB 500 mg/50mL premix  Status:  Discontinued        500 mg 100 mL/hr over 30 Minutes Intravenous Daily 06/09/24 1647 06/13/24 1539   06/06/24 1000  micafungin  (MYCAMINE ) 100 mg in sodium chloride  0.9 % 100 mL IVPB  Status:  Discontinued        100 mg 105 mL/hr over 1 Hours Intravenous Every 24 hours 06/05/24 1349 06/12/24 1142   06/04/24 1400  piperacillin -tazobactam (ZOSYN ) IVPB 3.375 g        3.375 g 12.5 mL/hr over 240 Minutes Intravenous Every 8 hours 06/04/24 0738 06/09/24 2124   06/04/24 1400  DAPTOmycin  (CUBICIN ) IVPB 500 mg/71mL premix        8 mg/kg  60.9 kg 100 mL/hr over 30 Minutes Intravenous Daily 06/04/24 0738 06/10/24 0126   06/03/24 1000  neomycin  (MYCIFRADIN ) tablet 500 mg  Status:  Discontinued        500 mg Oral 3 times daily 06/03/24 0935 06/03/24 1555   06/03/24 1000  metroNIDAZOLE  (FLAGYL ) tablet 500 mg  Status:  Discontinued       Note to Pharmacy: Take 2 pills (=1000mg ) by mouth at 1pm, 3pm, and 10pm the day before your colorectal  operation   500 mg Oral 3 times daily 06/03/24 0935 06/03/24 1555   05/29/24 1500  DAPTOmycin  (CUBICIN ) IVPB 500 mg/50mL premix  Status:  Discontinued        8 mg/kg  60.9 kg 100 mL/hr over 30 Minutes Intravenous Daily 05/29/24 1359 06/04/24 0738   05/28/24 2100  vancomycin  (VANCOREADY) IVPB 750 mg/150 mL  Status:  Discontinued        750 mg 150 mL/hr over 60 Minutes Intravenous Every 12 hours 05/28/24 1419 05/29/24 1359   05/27/24 0800  vancomycin  (VANCOCIN ) IVPB 1000 mg/200 mL premix  Status:  Discontinued        1,000 mg 200 mL/hr over 60 Minutes Intravenous Every 24 hours 05/26/24 1144 05/28/24 1419   05/23/24 1400  piperacillin -tazobactam (ZOSYN ) IVPB 3.375 g  Status:  Discontinued        3.375 g 12.5 mL/hr over 240 Minutes Intravenous Every 8 hours 05/23/24 0804 06/04/24 0738   05/23/24 1200  micafungin  (MYCAMINE ) 100 mg in sodium chloride  0.9 % 100 mL IVPB        100 mg 105 mL/hr over 1 Hours Intravenous Every 24 hours 05/23/24 0755 06/05/24 1221   05/22/24 0800  vancomycin  (VANCOCIN ) IVPB 1000 mg/200 mL premix  Status:  Discontinued        1,000 mg 200 mL/hr over 60 Minutes Intravenous Every 24 hours 05/21/24 0634 05/26/24 0806   05/21/24 0730  vancomycin  (VANCOREADY) IVPB 1250 mg/250 mL        1,250 mg 166.7 mL/hr over 90 Minutes Intravenous  Once 05/21/24 0634 05/21/24 1043   05/19/24 1400  piperacillin -tazobactam (ZOSYN ) IVPB 3.375 g  Status:  Discontinued        3.375 g 12.5 mL/hr over 240 Minutes Intravenous Every 8 hours 05/19/24 1303 05/23/24 0804   05/09/24 0900  erythromycin  250 mg in sodium chloride  0.9 % 100 mL IVPB        250 mg 100 mL/hr over 60 Minutes Intravenous Every 8 hours 05/09/24 0732 05/11/24 0044   05/08/24 2200  cefoTEtan  (CEFOTAN ) 2 g in sodium chloride  0.9 % 100 mL IVPB        2 g 200 mL/hr over 30 Minutes  Intravenous Every 12 hours 05/08/24 1759 05/09/24 0749   05/08/24 1400  neomycin  (MYCIFRADIN ) tablet 1,000 mg  Status:  Discontinued       Placed  in And Linked Group   1,000 mg Oral 3 times per day 05/08/24 1119 05/08/24 1120   05/08/24 1400  metroNIDAZOLE  (FLAGYL ) tablet 1,000 mg  Status:  Discontinued       Placed in And Linked Group   1,000 mg Oral 3 times per day 05/08/24 1119 05/08/24 1120   05/08/24 1130  cefoTEtan  (CEFOTAN ) 2 g in sodium chloride  0.9 % 100 mL IVPB        2 g 200 mL/hr over 30 Minutes Intravenous On call to O.R. 05/08/24 1119 05/09/24 0749         Note: Portions of this report may have been transcribed using voice recognition software. Every effort was made to ensure accuracy; however, inadvertent computerized transcription errors may be present.   Any transcriptional errors that result from this process are unintentional.    Elspeth KYM Schultze, MD, FACS, MASCRS Esophageal, Gastrointestinal & Colorectal Surgery Robotic and Minimally Invasive Surgery  [facility] [address] [place], [place] [address] [phone] Fax [phone] Main  CONTACT INFORMATION: Weekday (9AM-5PM): Call [facility] main office at [phone] Weeknight (5PM-9AM) or Weekend/Holiday: Check EPIC Web Links tab & use AMION (password  [email or web address]) for General Surgery [facility] coverage  Please, DO NOT use SecureChat  (it is not reliable communication to reach operating surgeons & will lead to a delay in care).   Epic staff messaging available for outptient concerns needing 1-2 business day response.      06/25/2024  8:30 AM

## 2024-06-25 NOTE — Plan of Care (Signed)
  Problem: Education: Goal: Verbalization of understanding of the causes of altered bowel function will improve Outcome: Progressing   Problem: Bowel/Gastric: Goal: Gastrointestinal status for postoperative course will improve Outcome: Progressing   Problem: Health Behavior/Discharge Planning: Goal: Identification of community resources to assist with postoperative recovery needs will improve Outcome: Progressing   Problem: Nutritional: Goal: Will attain and maintain optimal nutritional status will improve Outcome: Progressing   Problem: Clinical Measurements: Goal: Postoperative complications will be avoided or minimized Outcome: Progressing   Problem: Respiratory: Goal: Respiratory status will improve Outcome: Progressing   Problem: Skin Integrity: Goal: Will show signs of wound healing Outcome: Progressing   Problem: Education: Goal: Knowledge of General Education information will improve Description: Including pain rating scale, medication(s)/side effects and non-pharmacologic comfort measures Outcome: Progressing   Problem: Health Behavior/Discharge Planning: Goal: Ability to manage health-related needs will improve Outcome: Progressing   Problem: Clinical Measurements: Goal: Ability to maintain clinical measurements within normal limits will improve Outcome: Progressing Goal: Will remain free from infection Outcome: Progressing Goal: Diagnostic test results will improve Outcome: Progressing Goal: Respiratory complications will improve Outcome: Progressing Goal: Cardiovascular complication will be avoided Outcome: Progressing   Problem: Activity: Goal: Risk for activity intolerance will decrease Outcome: Progressing   Problem: Coping: Goal: Level of anxiety will decrease Outcome: Progressing   Problem: Elimination: Goal: Will not experience complications related to bowel motility Outcome: Progressing Goal: Will not experience complications related to  urinary retention Outcome: Progressing   Problem: Pain Managment: Goal: General experience of comfort will improve and/or be controlled Outcome: Progressing   Problem: Safety: Goal: Ability to remain free from injury will improve Outcome: Progressing   Problem: Skin Integrity: Goal: Risk for impaired skin integrity will decrease Outcome: Progressing   Problem: Education: Goal: Ability to describe self-care measures that may prevent or decrease complications (Diabetes Survival Skills Education) will improve Outcome: Progressing Goal: Individualized Educational Video(s) Outcome: Progressing   Problem: Coping: Goal: Ability to adjust to condition or change in health will improve Outcome: Progressing   Problem: Fluid Volume: Goal: Ability to maintain a balanced intake and output will improve Outcome: Progressing   Problem: Health Behavior/Discharge Planning: Goal: Ability to identify and utilize available resources and services will improve Outcome: Progressing Goal: Ability to manage health-related needs will improve Outcome: Progressing   Problem: Metabolic: Goal: Ability to maintain appropriate glucose levels will improve Outcome: Progressing   Problem: Nutritional: Goal: Maintenance of adequate nutrition will improve Outcome: Progressing Goal: Progress toward achieving an optimal weight will improve Outcome: Progressing   Problem: Skin Integrity: Goal: Risk for impaired skin integrity will decrease Outcome: Progressing   Problem: Tissue Perfusion: Goal: Adequacy of tissue perfusion will improve Outcome: Progressing   Problem: Activity: Goal: Ability to tolerate increased activity will improve Outcome: Progressing   Problem: Respiratory: Goal: Ability to maintain a clear airway and adequate ventilation will improve Outcome: Progressing   Problem: Role Relationship: Goal: Method of communication will improve Outcome: Progressing

## 2024-06-25 NOTE — Progress Notes (Signed)
 [facility] Liaison Note  Received request from [facility] manager for family interest in [facility]. Eligibility confirmed. Met with patient and spoke with family to confirm interest and explain services. Family agreeable to transfer today. TOC aware. RN please call report to [phone] prior to patient leaving the unit. Please send signed DNR with patient at discharge.   Thank you for allowing us  to participate in this patient's care.  Eleanor Nail, LPN [facility] Liaison [phone]

## 2024-06-25 NOTE — TOC Progression Note (Signed)
 Transition of Care [facility]) - Progression Note    Patient Details  Name: Elizabeth Odom MRN: [id number] Date of Birth: 05/31/1941  Transition of [facility]) CM/SW Contact  Alfonse JONELLE Rex, RN Phone Number: 06/25/2024, 11:08 AM  Clinical Narrative:  [facility] consult for [facility] evaluation. Call to patient's grand daughter, [name], confirmed accepted referral from [facility], agreeable to evaluation for inpatient hospice at [facility], team notified. NCM will keep grand daughter updated on process.      Expected Discharge Plan: (P) Skilled Nursing Facility Barriers to Discharge: (P) Continued Medical Work up, SNF Pending bed offer, Conservator, museum/gallery and Services   Discharge Planning Services: CM Consult   Living arrangements for the past 2 months: Apartment                                       Social Determinants of Health (SDOH) Interventions SDOH Screenings   Food Insecurity: No Food Insecurity (05/09/2024)  Housing: Low Risk  (05/09/2024)  Transportation Needs: No Transportation Needs (05/09/2024)  Utilities: Not At Risk (05/09/2024)  Depression (PHQ2-9): Low Risk  (01/02/2024)  Social Connections: Unknown (05/09/2024)  Tobacco Use: Low Risk  (05/08/2024)    Readmission Risk Interventions    05/12/2024    2:23 PM 05/09/2024    2:46 PM 10/19/2023    6:13 PM  Readmission Risk Prevention Plan  Transportation Screening Complete Complete Complete  PCP or Specialist Appt within 5-7 Days   Complete  PCP or Specialist Appt within 3-5 Days Complete Complete   Home Care Screening   Complete  Medication Review (RN CM)   Complete  HRI or Home Care Consult Complete Complete   Social Work Consult for Recovery Care Planning/Counseling Complete Complete   Palliative Care Screening Not Applicable Not Applicable   Medication Review Oceanographer) Complete Complete

## 2024-06-25 NOTE — TOC Transition Note (Signed)
 Transition of Care [facility]) - Discharge Note   Patient Details  Name: Elizabeth Odom MRN: [id number] Date of Birth: [birth year]  [facility]) CM/SW Contact:  Alfonse JONELLE Rex, RN Phone Number: 06/25/2024, 3:51 PM   Clinical Narrative:  DC to Elizabeth Odom, Elizabeth Odom daughter, Elizabeth Odom, notfiied. [facility] for transport. No further TOC needs identified at this time.      Final next level of care: [facility]) Barriers to Discharge: Barriers Resolved   Patient Goals and CMS Choice Patient states their goals for this hospitalization and ongoing recovery are:: home CMS Medicare.gov Compare Post Acute Care list provided to:: Patient Represenative (must comment) Elizabeth Odom (Granddaughter)  [phone] [facility] Phone)) Choice offered to / list presented to : Adult Children Elizabeth Odom ownership interest in [facility].provided to:: Adult Children    Discharge Placement              Patient chooses bed at:  Elizabeth O[facility]) Patient to be transferred to facility by: [facility] Name of family member notified: Elizabeth Odom (Granddaughter)  [phone] (Home Phone) Patient and family notified of of transfer: 06/25/24  Discharge Plan and Services Additional resources added to the After Visit Summary for     Discharge Planning Services: CM Consult                                 Social Drivers of Health (SDOH) Interventions SDOH Screenings   Food Insecurity: No Food Insecurity (05/09/2024)  Housing: Low Risk  (05/09/2024)  Transportation Needs: No Transportation Needs (05/09/2024)  Utilities: Not At Risk (05/09/2024)  Depression (PHQ2-9): Low Risk  (01/02/2024)  Social Connections: Unknown (05/09/2024)  Tobacco Use: Low Risk  (05/08/2024)     Readmission Risk Interventions    06/25/2024    3:50 PM 05/12/2024    2:23 PM 05/09/2024    2:46 PM  Readmission Risk Prevention Plan  Transportation Screening Complete Complete Complete  PCP or  Specialist Appt within 3-5 Days  Complete Complete  HRI or Home Care Consult  Complete Complete  Social Work Consult for Recovery Care Planning/Counseling  Complete Complete  Palliative Care Screening  Not Applicable Not Applicable  Medication Review Oceanographer) Complete Complete Complete  PCP or Specialist appointment within 3-5 days of discharge Complete    HRI or Home Care Consult Complete    SW Recovery Care/Counseling Consult Complete    Palliative Care Screening Complete    Skilled Nursing Facility Not Applicable

## 2024-06-25 NOTE — Progress Notes (Signed)
 Daily Progress Note   Patient Name: Elizabeth Odom       Date: 06/25/2024 DOB: 1941/02/15  Age: 83 y.o. MRN#: [id number] Attending Physician: [name], MD Primary Care Physician: Jesus Bernardino MATSU, MD Admit Date: 05/08/2024 Length of Stay: 48 days  Reason for Consultation/Follow-up: Establishing goals of care  Subjective:   Reviewed recent EMR including Documentation by pharmacy, TOC, and surgery.  Surgeon, Dr. Sheldon, I discussed care with granddaughter on 06/23/2024 and patient was transition to comfort focused care at that time.  Continuing TPN and hydration until patient can go to facility for hospice. Patient has continued to receive IV morphine  infusion at 2.5 mg/h, as needed IV Dilaudid  boluses, gabapentin  300 3 times daily, and octreotide  100 every 8 hours. Discussed care with bedside RN for medical updates.  RN noted difficulties with patient getting pills via tube at this time.  Requesting medications to be transition to liquid.  Agreed with this.  RN also noted that granddaughter had decided would like patient to be evaluated for beacon place for inpatient hospice.  Have placed TOC consult.  When presenting to bedside to see patient, patient sleeping in bed so did not awaken.  No visitors present at bedside.  Was noticed that despite sleeping patient has increased work of breathing and grimacing at times.  Have adjusted medications as able to solutions to assist with administration via tube.  Discussed care with TOC, ACC liaison, and RN to coordinate.  Patient to be evaluated for beacon placed to determine appropriateness for inpatient hospice.  Vital Signs:  BP 118/79 (BP Location: Left Arm)   Pulse (!) 103   Temp 98.7 F (37.1 C) (Oral)   Resp 15   Ht 5' 5 (1.651 m)   Wt 69.6 kg   SpO2 94%   BMI 25.53 kg/m   Physical Exam: General: Sleeping, ill-appearing, increased work of breathing, grimacing at times Cardiovascular: Tachycardia noted Respiratory: increased work  of breathing noted, not in respiratory distress Neuro: Sleeping   Assessment & Plan:   Assessment: Patient is an 83 year old female with a past medical history of DVT, PE, atrial fibrillation, hypertension, mild cognitive impairment, and large bowel obstruction status post colonic resection and colostomy creation in 2020 for which has been complicated by anastomotic leak.  Patient presented last month for colostomy resection and takedown but had complicated postop course with dense adhesions, A-fib with RVR, intra-abdominal abscess, and poor oral intake requiring support of TPN and PEG placement.  Palliative medicine team consulted to assist with complex medical decision making.  Recommendations/Plan: # Complex medical decision making/goals of care:  - Surgeon had discussed care with patient's granddaughter and patient previously and patient was transition to comfort focused care on 06/23/2024.  Continuing TPN and hydration to allow family time to visit and potentially have patient go to inpatient hospice.  Team has already discussed with granddaughter that patient cannot continue to receive artificial nutrition or hydration at inpatient hospice.  TOC consulted to assist with discharge coordination.  A CBC to evaluate as per granddaughter's request for beacon place.  Awaiting determination about possible acceptance.  # Symptom management: -RN noted difficulty with using patient's tube for medication administration with crushing pills.  Have transitioned medications to liquids as able including prior dosing of gabapentin , Atarax , metoprolol , and oxycodone . -Patient continues to receive IV morphine  infusion that was initiated on 715.  # Psychosocial support  - Already consulted chaplain to assist with spiritual support.  # Discharge Planning: Hospice facility.  Awaiting ACC determination as granddaughter requesting evaluation for inpatient hospice at [facility].  TOC assisting with coordination of  discharge planning..  Thank you for allowing the palliative care team to participate in the care Neal M Faux.  Tinnie Radar, DO Palliative Care Provider PMT # [phone]  If patient remains symptomatic despite maximum doses, please call PMT at [phone] between 0700 and 1900. Outside of these hours, please call attending, as PMT does not have night coverage.

## 2024-06-25 NOTE — Discharge Summary (Signed)
 Physician Discharge Summary    Elizabeth Odom MRN: [id number] DOB/AGE: 1941-02-05 = 83 y.o.  Patient Care Team: Jesus Bernardino MATSU, MD as PCP - General (Internal Medicine) [name], MD as Consulting Physician (Gastroenterology) [name], MD (Neurology) [name], MD as Consulting Physician (Urology) [name], MD as Consulting Physician (Cardiology) [name], MD as Consulting Physician (Colon and Rectal Surgery)  Admit date: 05/08/2024  Discharge date: 06/25/2024 [facility] Stay = 48 days    Discharge Diagnoses:  Principal Problem:   Diverticular disease Active Problems:   A-fib (HCC)   Restless leg   ABLA (acute blood loss anemia)   Need for emotional support   Acute urinary retention   Mixed hyperlipidemia   Essential hypertension   Diverticular disease of left colon   History of DVT (deep vein thrombosis)   History of pulmonary embolus (PE)   Pre-diabetes   Presence of IVC filter   Stricture of sigmoid colon (HCC)   Protein-calorie malnutrition, severe   Hypokalemia   S/P percutaneous endoscopic gastrostomy (PEG) tube placement (HCC)   Goals of care, counseling/discussion   Shock, during or resulting from a surgical procedure   Intra-abdominal abscess (HCC)   Medication management   05/08/2024   POST-OPERATIVE DIAGNOSIS:   COLOSTOMY FOR RESECTION, DESIRE FOR OSTOMY TAKEDOWN RECTAL STRICTURE HISTORY OF DIVERTICULITIS WITH PERFORATION & ABSCESS   PROCEDURE:   -ROBOTIC RECTOSIGMOID RESECTION (LAR) -TAKEDOWN OF END COLOSTOMY WITH ANASTOMOSIS -RESECTION OF SMALL INTESTINE WITH ANASTOMOSIS -SMALL BOWEL REPAIR -LYSIS OF ADHESIONS x 115 MINUTES (66% OF CASE),  -INTRAOPERATIVE ASSESSMENT OF TISSUE VASCULAR PERFUSION USING ICG (indocyanine green ) IMMUNOFLUORESCENCE,  -TRANSVERSUS ABDOMINIS PLANE (TAP) BLOCK - BILATERAL -FLEXIBLE SIGMOIDOSCOPY   SURGEON:  [name], MD     05/20/2024   Post procedural Dx: Post op abscess,  multiple   Technically successful CT guided placed of a  10 Fr drainage catheter placement x 2  into the midline ventral and dorsal perirectal abscesses.      [name] LABOR, MD     05/28/2024   IMPRESSION: Satisfactory removal of the midline anterior abscess drainage catheter and placement of a new midline anterior pelvic drainage catheter as well as a posterior transgluteal drainage catheter in pelvic abscesses.     Electronically Signed   By: [name]    06/03/2024   POST-OPERATIVE DIAGNOSIS:   DELAYED COLORECTAL ANASTOMOTIC LEAK, NEED FOR FECAL DIVERSION CHRONIC PELVIC ABSCESS   PROCEDURE:   DIAGNOSTIC LAPAROSCOPY CONVERTED TO LAPAROTOMY DIVERTING LOOP TRANSVERSE COLOSTOMY DRAINAGE OF ABDOMINAL ABSCESS GASTROSTOMY TUBE PLACEMENT RECTAL TUBE PLACEMENT   SURGEON:  [name], MD   OR FINDINGS: Very dense concrete intraperitoneal and pelvic adhesions infraumbilically.  Bladder, small bowel, omentum walling off the lower pelvis with delayed anastomotic leak.   Near disruption of colorectal anastomosis by transanal examination.  Rectal tube placed through rectal stump to drain the pelvis more directly.   Diverting mid-transverse loop colostomy done.   Removal of old percutaneous abdominal and transgluteal drains.        Consults: Case Management / Social Work, Physical Therapy, Occupational Therapy, Speech Therapy, Pharmacy, Nutrition, Wound ostomy consult nurse (WOCN), Anesthesia, Internal Medicine (Hospitalist), Critical Care Medicine, Interventional radiology, Cardiology, Palliative Care, and Urology Infectious disease  Hospital Course:   Abdomen with episode of bad diverticulitis with abscesses requiring Hartmann resection last year.  Postoperative pelvic abscesses and prolonged recovery.  Eventually stabilized and desired colostomy takedown.  Clearance is done.  Patient robotically adhesions  with resection of rectal stump and takedown of  colostomy.  Patient went into postoperative atrial fibrillation.  Cardiology consulted.  Transferred to stepdown unit.  Medicine consulted and later critical care.  Rate controlled on.  Anticoagulated.  Patient stabilized and transferred out but began to have persistent episodes of hematochezia.  Anticoagulation held and improved.  Patient had ileus with nasogastric tube decompression.  Had recurrent urinary tension and Foley catheter had to be replaced.  Patient then developed diarrhea.  Seemed to control with bowel regimen and slowly advancing diet rather deconditioned.  Patient seemed to decline.  CAT scan revealed gas and fluid collections in abdomen and pelvis suspicious for delayed anastomotic leak.  Patient not with peritonitis nor shock and declined fecal diversion with ostomy.  Therefore abdominal pelvic drains placed with broad antibiotics and TPN nutrition and bowel rest.  Patient initially seemed to have some gradual improvement.  However, pelvic drain fell out and was challenging to get replaced back in given back on oral anticoagulation.  Patient did not really.  Repeat scan raise concern of worsening disruption of anastomosis.  I convinced patient for operative diversion.  Patient back taken back to operating room.  Very fixed fibrotic abdomen.  Was able to bring up a proximal transverse colostomy and placed abdominal drains.  Patient had declined in the eating and effort in I placed a gastrostomy tube for medications and tube feeds.  Rectum evacuated and washed out.  Patient had prolonged ileus but began to have colostomy function eventually tolerate clamping trials.  Patient was advanced on tube feeds.  Medicine desired patient go back on anticoagulation but had significant anemia requiring holding anticoagulation transfusion.  Repeat scan noted left upper quadrant collection consistent with hematoma.  Radiology initially unable to safely place drain with worsening bleeding.  Anticoagulation  stopped and transfused.  Eventually drain was able to be placed.  Patient had some hypoxia and was diuresed.  Had persistent pleural effusions.  Eventually had thoracentesis of right side with some improvement and weaned down to room air.  Patient then began to have transanal diarrhea and colostomy stopped functioning.  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  Antidiarrheals placed and eventually increased a bit hard to control.  Required urinary and fecal tubes.  Patient became more consistently bedridden and struggle with pain and discomfort.  Patient transferred to different floors with different therapist and different nurses and attempts to try and get her to interact to be motivated but felt much worse.  Palliative care consulted.  Medicine help follow throughout the course.  Infectious disease help follow and manage antibiotics.  Last 2 episodes of draining in abdomen and chest showed no infection.  I had discussions with granddaughter, so family member in contact with this patient.  Eventually had discussion with patient and her granddaughter that I felt we are hitting a point of futility after being over a month in the hospital.  I did not feel it was safe to operate yet again [place] in the abdomen is extremely hostile.  Could not control her transanal diarrhea and incontinence concerning for more proximal small bowel fistula.  Discussions made in granddaughter and eventually patient wished to transition to comfort care and hospice under the recommendations of palliative care.  Internal medicine and infectious disease agreed with surgery.  Antibiotics stopped and more aggressive pain control regimen done.  Patient was accepted to [facility] hosp[facility] on 716/2025.   Discharged  Condition: poor  Discharge Exam: Blood pressure 118/79, pulse (!) 103, temperature 98.7 F (37.1 C), temperature source Oral, resp. rate 15, height 5' 5  (1.651 m), weight 69.6 kg, SpO2 94%.  General: Sleeping.  Awakens with mild mumbling.  Mildly tearful but coming to terms.  Looks more comfortable.     Eyes: PERRL, normal EOM.  Sclera clear.  No icterus Neuro: CN II-XII intact w/o focal sensory/motor deficits.  No facial droop. Lymph: No head/neck/groin lymphadenopathy Psych:  No delerium/psychosis/paranoia.  Oriented x 4 HENT: Normocephalic, Mucus membranes moist.  No thrush.  Mild HOH Neck: Supple, No tracheal deviation.  No obvious thyromegaly.  Chest: No pain to chest wall compression.  Good respiratory excursion.  Mild conversational dyspnea and tachypnea.  No audible wheezing.   CV:  Pulses intact.  Mostly regular rhythm.  No major extremity edema MS: Normal AROM mjr joints.  No obvious deformity   Abdomen:  Most of abdomen with firmness consistent with rectus sheath abdominal wall hematoma.  Right side of abdomen softer.  Moderately distended.  Mildly tender at incisions only.  No guarding.   Right upper quadrant mid transverse diverting loop colostomy pink with edema.  Scant stool  Midline incision with some granulation. superificial Gastrostomy tube left upper quadrant without any leaking       Disposition:    Follow-up Information     [name], MD Follow up on 06/02/2024.   Specialties: General Surgery, Colon and Rectal Surgery Why: To follow up after your operation Contact information: [address] [place] [address] [phone]         Care, [facility] Follow up.   Specialty: Home Health Services Contact information: [address] [place] [address] [phone]                 Discharge disposition: 51-Hospice/Medical Facility       Discharge Instructions     Call MD for:   Complete by: As directed    FEVER > 101.5 F  (temperatures < 101.5 F are not significant)   Call MD for:  extreme fatigue   Complete by: As directed    Call MD for:   persistant dizziness or light-headedness   Complete by: As directed    Call MD for:  persistant nausea and vomiting   Complete by: As directed    Call MD for:  redness, tenderness, or signs of infection (pain, swelling, redness, odor or green/yellow discharge around incision site)   Complete by: As directed    Call MD for:  severe uncontrolled pain   Complete by: As directed    Diet - low sodium heart healthy   Complete by: As directed    Start with a bland diet such as soups, liquids, starchy foods, low fat foods, etc. the first few days at home. Gradually advance to a solid, low-fat, high fiber diet by the end of the first week at home.   Add a fiber supplement to your diet (Metamucil, etc) If you feel full, bloated, or constipated, stay on a full liquid or pureed/blenderized diet for a few days until you feel better and are no longer constipated.   Discharge instructions   Complete by: As directed    See Discharge Instructions If you are not getting better after two weeks or are noticing you are getting worse, contact our office [phone] for further advice.  We may need to adjust your medications, re-evaluate you in  the office, send you to the emergency room, or see what other things we can do to help. The clinic staff is available to answer your questions during regular business hours (8:30am-5pm).  Please don't hesitate to call and ask to speak to one of our nurses for clinical concerns.    A surgeon from [facility] is always on call at the hospitals 24 hours/day If you have a medical emergency, go to the nearest emergency room or call 911.   Discharge wound care:   Complete by: As directed    It is good for closed incisions and even open wounds to be washed every day.  Shower every day.  Short baths are fine.  Wash the incisions and wounds clean with soap & water .    You may leave closed incisions open to air if it is dry.   You may cover the incision with clean  gauze & replace it after your daily shower for comfort.  TEGADERM:  You have clear gauze band-aid dressings over your closed incision(s).  Remove the dressings 2 days after surgery.   Driving Restrictions   Complete by: As directed    You may drive when: - you are no longer taking narcotic prescription pain medication - you can comfortably wear a seatbelt - you can safely make sudden turns/stops without pain.   Increase activity slowly   Complete by: As directed    Start light daily activities --- self-care, walking, climbing stairs- beginning the day after surgery.  Gradually increase activities as tolerated.  Control your pain to be active.  Stop when you are tired.  Ideally, walk several times a day, eventually an hour a day.   Most people are back to most day-to-day activities in a few weeks.  It takes 4-6 weeks to get back to unrestricted, intense activity. If you can walk 30 minutes without difficulty, it is safe to try more intense activity such as jogging, treadmill, bicycling, low-impact aerobics, swimming, etc. Save the most intensive and strenuous activity for last (Usually 4-8 weeks after surgery) such as sit-ups, heavy lifting, contact sports, etc.  Refrain from any intense heavy lifting or straining until you are off narcotics for pain control.  You will have off days, but things should improve week-by-week. DO NOT PUSH THROUGH PAIN.  Let pain be your guide: If it hurts to do something, don't do it.   Lifting restrictions   Complete by: As directed    If you can walk 30 minutes without difficulty, it is safe to try more intense activity such as jogging, treadmill, bicycling, low-impact aerobics, swimming, etc. Save the most intensive and strenuous activity for last (Usually 4-8 weeks after surgery) such as sit-ups, heavy lifting, contact sports, etc.   Refrain from any intense heavy lifting or straining until you are off narcotics for pain control.  You will have off days, but  things should improve week-by-week. DO NOT PUSH THROUGH PAIN.  Let pain be your guide: If it hurts to do something, don't do it.  Pain is your body warning you to avoid that activity for another week until the pain goes down.   May shower / Bathe   Complete by: As directed    May walk up steps   Complete by: As directed    Remove dressing in 48 hours   Complete by: As directed    Make sure all dressings have been removed on the second day after surgery = 5/31 Saturday Leave incisions  open to air.  OK to cover incisions with gauze or bandages as desired   Sexual Activity Restrictions   Complete by: As directed    You may have sexual intercourse when it is comfortable. If it hurts to do something, stop.       Allergies as of 06/25/2024       Reactions   Elemental Sulfur Anaphylaxis   Heparin     Severe bleeding on heparin  x3 = AVOID        Medication List     TAKE these medications    amLODipine  5 MG tablet Commonly known as: NORVASC  Take 1 tablet (5 mg total) by mouth daily. What changed: when to take this   ascorbic acid  250 MG Chew Commonly known as: VITAMIN C  Chew 250 mg by mouth daily.   aspirin  EC 81 MG tablet Take 81 mg by mouth daily. Swallow whole.   cyanocobalamin  1000 MCG tablet Commonly known as: VITAMIN B12 Take 1,000 mcg by mouth daily.   FIBER GUMMIES PO Take 1 capsule by mouth daily.   multivitamin with minerals Tabs tablet Take 1 tablet by mouth daily.   OMEGA 3 500 PO Take 500 mg by mouth daily.   OVER THE COUNTER MEDICATION Take 650 mg by mouth daily. Total Beets supplement   traMADol  50 MG tablet Commonly known as: ULTRAM  Take 1-2 tablets (50-100 mg total) by mouth every 6 (six) hours as needed for moderate pain (pain score 4-6) or severe pain (pain score 7-10).               Discharge Care Instructions  (From admission, onward)           Start     Ordered   05/08/24 0000  Discharge wound care:       Comments: It is good  for closed incisions and even open wounds to be washed every day.  Shower every day.  Short baths are fine.  Wash the incisions and wounds clean with soap & water .    You may leave closed incisions open to air if it is dry.   You may cover the incision with clean gauze & replace it after your daily shower for comfort.  TEGADERM:  You have clear gauze band-aid dressings over your closed incision(s).  Remove the dressings 2 days after surgery.   05/08/24 1149            Significant Diagnostic Studies:  Results for orders placed or performed during the hospital encounter of 05/08/24 (from the past 72 hours)  CK     Status: Abnormal   Collection Time: 06/23/24  2:32 AM  Result Value Ref Range   Total CK 31 (L) 38 - 234 U/L    Comment: Performed at [facility], [address] [place], [place] [address]  Comprehensive metabolic panel     Status: Abnormal   Collection Time: 06/23/24  2:32 AM  Result Value Ref Range   Sodium 132 (L) 135 - 145 mmol/L   Potassium 4.0 3.5 - 5.1 mmol/L   Chloride 103 98 - 111 mmol/L   CO2 21 (L) 22 - 32 mmol/L   Glucose, Bld 121 (H) 70 - 99 mg/dL    Comment: Glucose reference range applies only to samples taken after fasting for at least 8 hours.   BUN 23 8 - 23 mg/dL   Creatinine, Ser <9.69 (L) 0.44 - 1.00 mg/dL   Calcium  8.0 (L) 8.9 - 10.3 mg/dL  Total Protein 6.7 6.5 - 8.1 g/dL   Albumin  1.8 (L) 3.5 - 5.0 g/dL   AST 55 (H) 15 - 41 U/L   ALT 47 (H) 0 - 44 U/L   Alkaline Phosphatase 278 (H) 38 - 126 U/L   Total Bilirubin 1.7 (H) 0.0 - 1.2 mg/dL   GFR, Estimated NOT CALCULATED >60 mL/min    Comment: (NOTE) Calculated using the CKD-EPI Creatinine Equation (2021)    Anion gap 8 5 - 15    Comment: Performed at [facility], [address], [place], [place] [address]  Magnesium      Status: None   Collection Time: 06/23/24  2:32 AM  Result Value Ref Range   Magnesium  1.9 1.7 - 2.4 mg/dL    Comment: Performed at  [facility], [address], [place], [place] [address]  Phosphorus     Status: None   Collection Time: 06/23/24  2:32 AM  Result Value Ref Range   Phosphorus 2.8 2.5 - 4.6 mg/dL    Comment: Performed at [facility], [address], [place], [place] [address]  Triglycerides     Status: Abnormal   Collection Time: 06/23/24  2:32 AM  Result Value Ref Range   Triglycerides 194 (H) <150 mg/dL    Comment: Performed at [facility], [address] [place], [place] [address]  Prealbumin     Status: None   Collection Time: 06/23/24 12:45 PM  Result Value Ref Range   Prealbumin 18 18 - 38 mg/dL    Comment: Performed at [facility], [address], [place], [place] [address]  Basic metabolic panel with GFR     Status: Abnormal   Collection Time: 06/24/24  1:24 AM  Result Value Ref Range   Sodium 131 (L) 135 - 145 mmol/L   Potassium 4.1 3.5 - 5.1 mmol/L   Chloride 100 98 - 111 mmol/L   CO2 24 22 - 32 mmol/L   Glucose, Bld 155 (H) 70 - 99 mg/dL    Comment: Glucose reference range applies only to samples taken after fasting for at least 8 hours.   BUN 24 (H) 8 - 23 mg/dL   Creatinine, Ser 9.63 (L) 0.44 - 1.00 mg/dL   Calcium  8.2 (L) 8.9 - 10.3 mg/dL   GFR, Estimated >39 >39 mL/min    Comment: (NOTE) Calculated using the CKD-EPI Creatinine Equation (2021)    Anion gap 7 5 - 15    Comment: Performed at [facility], [address] [place], [place] [address]  Magnesium      Status: None   Collection Time: 06/24/24  1:24 AM  Result Value Ref Range   Magnesium  2.0 1.7 - 2.4 mg/dL    Comment: Performed at [facility], [address], [place], [place] [address]  Phosphorus     Status: None   Collection Time: 06/24/24  1:24 AM  Result Value Ref Range   Phosphorus 3.2 2.5 - 4.6 mg/dL    Comment: Performed at [facility], [address] [place], [place] [address]    DG  Chest Port 1 View Result Date: 05/09/2024 CLINICAL DATA:  AFib postop colon surgery EXAM: PORTABLE CHEST 1 VIEW COMPARISON:  09/20/2023 FINDINGS: No acute airspace disease or effusion. Cardiomediastinal silhouette within normal limits. Aortic atherosclerosis. Chest wall emphysema bilaterally. Small volume free air beneath the right diaphragm. IVC filter to the right of L3. IMPRESSION: 1. No active disease.  2. Small volume free air beneath the right diaphragm and bilateral chest wall emphysema, likely postsurgical. It Electronically Signed   By: Luke Bun M.D.   On: 05/09/2024 19:41   DG C-Arm 1-60 Min-No Report Result Date: 05/08/2024 Fluoroscopy was utilized by the requesting physician.  No radiographic interpretation.    Past Medical History:  Diagnosis Date   A-fib (HCC) 04/09/2016   Acute head injury 04/09/2016   Anemia    Blood dyscrasia    Hx DVT / PE   Cervical cancer (HCC)    Cervical spine fracture, initial encounter 04/09/2016   Claustrophobia    extremely claustrophobic   Dupuytren disease of palm of both hands    Essential hypertension    Fatty liver    History of kidney stones    MCI (mild cognitive impairment) with memory loss    Multiple fractures of cervical spine (HCC) 04/09/2016   Restless leg    Syncope and collapse 04/09/2016    Past Surgical History:  Procedure Laterality Date   APPENDECTOMY     BIOPSY  09/29/2023   Procedure: BIOPSY;  Surgeon: Saintclair Jasper, MD;  Location: [facility];  Service: Gastroenterology;;   breast lump removal Left    CERVICAL LAMINECTOMY     COLECTOMY WITH COLOSTOMY CREATION/HARTMANN PROCEDURE N/A 10/01/2023   Procedure: COLECTOMY WITH COLOSTOMY CREATION/HARTMANN PROCEDURE;  Surgeon: Rubin Calamity, MD;  Location: [facility];  Service: General;  Laterality: N/A;   complete hysterectomy     CYSTOSCOPY WITH INDOCYANINE GREEN  IMAGING (ICG) N/A 05/08/2024   Procedure: CYSTOSCOPY WITH INDOCYANINE GREEN  IMAGING (ICG);  Surgeon:  Shane Steffan BROCKS, MD;  Location: [facility];  Service: Urology;  Laterality: N/A;   EP IMPLANTABLE DEVICE N/A 04/10/2016   Procedure: Loop Recorder Insertion;  Surgeon: Danelle LELON Birmingham, MD;  [facility]: [facility];  Service: Cardiovascular;  Laterality: N/A;   FLEXIBLE SIGMOIDOSCOPY N/A 09/29/2023   Procedure: FLEXIBLE SIGMOIDOSCOPY;  Surgeon: Saintclair Jasper, MD;  Location: [facility];  Service: Gastroenterology;  Laterality: N/A;   FLEXIBLE SIGMOIDOSCOPY N/A 05/08/2024   Procedure: SIGMOIDOSCOPY, FLEXIBLE;  Surgeon: [name], MD;  Location: [facility];  Service: General;  Laterality: N/A;   HARVEST BONE GRAFT     INCISION AND DRAINAGE ABSCESS  06/03/2024   Procedure: INCISION AND DRAINAGE, ABSCESS;  Surgeon: [name], MD;  Location: [facility];  Service: General;;  Abdominal Abscess   IR IVC FILTER PLMT / S&I /IMG GUID/MOD SED  10/05/2023   IR SINUS/FIST TUBE CHK-NON GI  10/22/2023   IR SINUS/FIST TUBE CHK-NON GI  11/05/2023   LAPAROSCOPIC DIVERTED COLOSTOMY  06/03/2024   Procedure: CREATION, COLOSTOMY, DIVERTING, LAPAROSCOPIC;  Surgeon: [name], MD;  Location: [facility];  Service: General;;  Laparoscopy converted to Open   LAPAROSCOPIC GASTROSTOMY N/A 06/03/2024   Procedure: CREATION, GASTROSTOMY, LAPAROSCOPIC;  Surgeon: [name], MD;  Location: [facility];  Service: General;  Laterality: N/A;  FEEDING GASROSTOMY TUBE   LAPAROSCOPIC LYSIS OF ADHESIONS N/A 05/08/2024   Procedure: LYSIS, ADHESIONS, LAPAROSCOPIC;  Surgeon: [name], MD;  Location: [facility];  Service: General;  Laterality: N/A;  LYSIS OF ADHESIONS   LAPAROSCOPY  06/03/2024   Procedure: LAPAROSCOPY, DIAGNOSTIC;  Surgeon: [name], MD;  Location: [facility];  Service: General;;   LAPAROTOMY N/A 06/03/2024   Procedure: LAPAROTOMY, EXPLORATORY;  Surgeon: [name], MD;  Location: [facility];  Service: General;  Laterality: N/A;   LOOP RECORDER REMOVAL N/A 07/08/2018   Procedure: LOOP RECORDER REMOVAL;  Surgeon: Birmingham Danelle LELON,  MD;  [facility]: [facility];  Service: Cardiovascular;  Laterality: N/A;   XI ROBOTIC ASSISTED COLOSTOMY TAKEDOWN N/A 05/08/2024   Procedure: CLOSURE, COLOSTOMY, ROBOT-ASSISTED -ROBOTIC RECTOSIGMOID RESECTION (LAR) -TAKEDOWN OF END COLOSTOMY WITH ANASTOMOSIS -RESECTION OF SMALL INTESTINE WITH ANASTOMOSIS -SMALL BOWEL REPAIR -LYSIS OF ADHESIONS x 115 MINUTES (66% OF CASE),  -INTRAOPERATIVE ASSESSMENT OF TISSUE VASCULAR PERFUSION USING ICG (indocyanine green ) IMMUNOFLUORESCENCE,  -TRANSVERSUS ABDOMINIS PLANE (TAP) BLOCK - BILATE    Social History   Socioeconomic History   Marital status: Widowed    Spouse name: Not on file   Number of children: 3   Years of education: schooling in [place]   Highest education level: Not on file  Occupational History   Not on file  Tobacco Use   Smoking status: Never   Smokeless tobacco: Never  Vaping Use   Vaping status: Never Used  Substance and Sexual Activity   Alcohol  use: Never    Alcohol /week: 0.0 standard drinks of alcohol    Drug use: Never   Sexual activity: Not Currently  Other Topics Concern   Not on file  Social History Narrative   Patient is originally from [place], [place].   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    Her emergency contact/medical decision maker is her granddaughter:  [name] [phone]).   Social Drivers of Corporate investment banker Strain: Not on file  Food Insecurity: No Food Insecurity (05/09/2024)   Hunger Vital Sign    Worried About Running Out of Food in the Last Year: Never true    Ran Out of Food in the Last Year: Never true  Transportation Needs: No Transportation Needs (05/09/2024)   PRAPARE - Administrator, Civil Service (Medical): No    Lack of Transportation (Non-Medical): No  Physical Activity: Not on file  Stress: Not on file  Social Connections: Unknown (05/09/2024)   Social Connection and Isolation  Panel    Frequency of Communication with Friends and Family: Three times a week    Frequency of Social Gatherings with Friends and Family: Once a week    Attends Religious Services: 1 to 4 times per year    Active Member of [employer] or Organizations: Yes    Attends Banker Meetings: 1 to 4 times per year    Marital Status: Patient declined  Intimate Partner Violence: Not At Risk (05/09/2024)   Humiliation, Afraid, Rape, and Kick questionnaire    Fear of Current or Ex-Partner: No    Emotionally Abused: No    Physically Abused: No    Sexually Abused: No    Family History  Problem Relation Age of Onset   Other Mother        thrombosis   Cancer Sister    Diabetes Sister    Stroke Daughter    Thyroid  disease Daughter     Current Facility-Administered Medications  Medication Dose Route Frequency Provider Last Rate Last Admin   0.9 %  sodium chloride  infusion  250 mL Intravenous PRN [name], MD 10 mL/hr at 06/19/24 1837 250 mL at 06/19/24 1837   acetaminophen  (TYLENOL ) 160 MG/5ML solution 1,000 mg  1,000 mg Per Tube Q6H Green, Terri L, RPH   1,000 mg at 06/25/24 1410   alum & mag hydroxide-simeth (MAALOX/MYLANTA) 200-200-20 MG/5ML suspension 30 mL  30 mL Per Tube Q6H PRN [name], MD       Chlorhexidine  Gluconate Cloth 2 %  PADS 6 each  6 each Topical Daily [name], MD   6 each at 06/25/24 [phone]   gabapentin  (NEURONTIN ) 250 MG/5ML solution 300 mg  300 mg Oral Q8H Mims, Lauren W, DO   300 mg at 06/25/24 1412   hydrALAZINE  (APRESOLINE ) injection 5 mg  5 mg Intravenous Q6H PRN [name], MD   5 mg at 06/06/24 2120   HYDROmorphone  (DILAUDID ) injection 1-1.5 mg  1-1.5 mg Intravenous Q2H PRN Anwar, Zeba, MD   1 mg at 06/24/24 0740   hydrOXYzine  (ATARAX ) 10 MG/5ML syrup 10 mg  10 mg Oral TID PRN Clayton Tinnie ORN, DO       iron  sucrose (VENOFER ) 200 mg in sodium chloride  0.9 % 100 mL IVPB  200 mg Intravenous Weekly [name], MD   Stopped at 06/19/24 9188   lip balm  (CARMEX) ointment   Topical PRN [name], MD   Given at 05/20/24 1051   liver oil-zinc  oxide (DESITIN) 40 % ointment   Topical BID [name], MD   Given at 06/25/24 9146   menthol -cetylpyridinium (CEPACOL) lozenge 3 mg  1 lozenge Oral PRN [name], MD       metoprolol  tartrate (LOPRESSOR ) 25 mg/10 mL oral suspension 12.5 mg  12.5 mg Per Tube BID Landy, Terri L, RPH       metoprolol  tartrate (LOPRESSOR ) injection 2.5 mg  2.5 mg Intravenous Q6H PRN [name], MD       morphine  100mg  in NS 100mL (1mg /mL) infusion - premix  2.5 mg/hr Intravenous Continuous [name], MD 2.5 mL/hr at 06/24/24 1803 2.5 mg/hr at 06/24/24 1803   And   morphine  bolus via infusion 1 mg  1 mg Intravenous Q30 min PRN Anwar, Zeba, MD       naphazoline-glycerin  (CLEAR EYES REDNESS) ophth solution 1-2 drop  1-2 drop Both Eyes QID PRN [name], MD       nystatin  (MYCOSTATIN /NYSTOP ) topical powder   Topical BID [name], MD   Given at 06/25/24 0853   octreotide  (SANDOSTATIN ) injection 100 mcg  100 mcg Subcutaneous Q8H Dalyla Chui, MD   100 mcg at 06/25/24 1417   ondansetron  (ZOFRAN ) injection 4 mg  4 mg Intravenous Q6H PRN [name], MD   4 mg at 05/16/24 0426   Opium  10 MG/ML (1%) tincture 6 mg  6 mg Per Tube QID [name], MD   6 mg at 06/25/24 1411   oxyCODONE  (ROXICODONE ) 5 MG/5ML solution 5 mg  5 mg Oral Q4H PRN Mims, Lauren W, DO       phenol (CHLORASEPTIC) mouth spray 2 spray  2 spray Mouth/Throat PRN [name], MD       prochlorperazine  (COMPAZINE ) injection 5-10 mg  5-10 mg Intravenous Q6H PRN [name], MD   10 mg at 05/12/24 0155   simethicone  (MYLICON) 40 MG/0.6ML suspension 80 mg  80 mg Per Tube QID PRN [name], MD       sodium chloride  (OCEAN) 0.65 % nasal spray 1-2 spray  1-2 spray Each Nare Q6H PRN [name], MD       sodium chloride  flush (NS) 0.9 % injection 10-40 mL  10-40 mL Intracatheter LENNETTE [name], MD   10 mL at 06/25/24 0854   sodium chloride   flush (NS) 0.9 % injection 3 mL  3 mL Intravenous Q12H Kyley Solow, MD   3 mL at 06/25/24 0854   sodium chloride  flush (NS) 0.9 % injection 3 mL  3 mL Intravenous PRN Damya Comley,  Elspeth, MD       sodium chloride  flush (NS) 0.9 % injection 5 mL  5 mL Intracatheter Q8H Jamarco Zaldivar, MD   5 mL at 06/25/24 0542   tamsulosin  (FLOMAX ) capsule 0.4 mg  0.4 mg Oral QPC breakfast Sheldon Elspeth, MD   0.4 mg at 06/24/24 9043   TPN ADULT (ION)   Intravenous Continuous TPN Mark Bard LABOR, RPH 40 mL/hr at 06/24/24 1804 New Bag at 06/24/24 1804   TPN ADULT (ION)   Intravenous Continuous TPN Casimir Camelia RAMAN, RPH       witch hazel-glycerin  (TUCKS) pad   Topical PRN Sheldon Elspeth, MD         Allergies  Allergen Reactions   Elemental Sulfur Anaphylaxis   Heparin      Severe bleeding on heparin  x3 = AVOID    Signed:   Elspeth KYM Sheldon, MD, FACS, MASCRS Esophageal, Gastrointestinal & Colorectal Surgery Robotic and Minimally Invasive Surgery  [facility] [address] [place], [place] [address] [phone] Fax [phone] Main  CONTACT INFORMATION: Weekday (9AM-5PM): Call [facility] main office at [phone] Weeknight (5PM-9AM) or Weekend/Holiday: Check EPIC Web Links tab & use AMION (password  [email or web address]) for General Surgery [facility] coverage  Please, DO NOT use SecureChat  (it is not reliable communication to reach operating surgeons & will lead to a delay in care).   Epic staff messaging available for outptient concerns needing 1-2 business day response.      06/25/2024, 4:02 PM

## 2024-07-11 LAB — CULTURE, FUNGUS WITHOUT SMEAR

## 2024-07-11 DEATH — deceased

## 2024-07-22 LAB — FUNGUS CULTURE WITH STAIN

## 2024-07-22 LAB — FUNGAL ORGANISM REFLEX

## 2024-07-22 LAB — FUNGUS CULTURE RESULT

## 2024-08-02 LAB — ACID FAST CULTURE WITH REFLEXED SENSITIVITIES (MYCOBACTERIA): Acid Fast Culture: NEGATIVE

## 2024-09-24 LAB — YEAST SUSCEPTIBILITIES
Amphotericin B MIC: 0.25
Fluconazole Islt MIC: 1
ISAVUCONAZOLE MIC: 0.008
Itraconazole MIC: 0.06
Posaconazole MIC: 0.06
REZAFUNGIN MIC: 0.015
Voriconazole MIC: 0.015
